# Patient Record
Sex: Male | Born: 1965 | Race: Black or African American | Hispanic: No | Smoking: Former smoker
Health system: Southern US, Community
[De-identification: ages and names within clinical notes are randomized; demographics above are authoritative.]

## PROBLEM LIST (undated history)

## (undated) ENCOUNTER — Emergency Department (HOSPITAL_COMMUNITY): Discharge status: Absent without leave | Payer: Self-pay

## (undated) DIAGNOSIS — K219 Gastro-esophageal reflux disease without esophagitis: Secondary | ICD-10-CM

## (undated) DIAGNOSIS — J40 Bronchitis, not specified as acute or chronic: Secondary | ICD-10-CM

## (undated) DIAGNOSIS — D869 Sarcoidosis, unspecified: Secondary | ICD-10-CM

## (undated) DIAGNOSIS — G4733 Obstructive sleep apnea (adult) (pediatric): Secondary | ICD-10-CM

## (undated) DIAGNOSIS — J189 Pneumonia, unspecified organism: Secondary | ICD-10-CM

## (undated) DIAGNOSIS — J9610 Chronic respiratory failure, unspecified whether with hypoxia or hypercapnia: Secondary | ICD-10-CM

## (undated) DIAGNOSIS — Z9989 Dependence on other enabling machines and devices: Secondary | ICD-10-CM

## (undated) DIAGNOSIS — J449 Chronic obstructive pulmonary disease, unspecified: Secondary | ICD-10-CM

## (undated) DIAGNOSIS — I10 Essential (primary) hypertension: Secondary | ICD-10-CM

## (undated) DIAGNOSIS — I509 Heart failure, unspecified: Secondary | ICD-10-CM

## (undated) HISTORY — DX: Chronic obstructive pulmonary disease, unspecified: J44.9

## (undated) HISTORY — PX: ROTATOR CUFF REPAIR: SHX139

## (undated) HISTORY — PX: OTHER SURGICAL HISTORY: SHX169

---

## 2000-02-22 ENCOUNTER — Ambulatory Visit: Admission: RE | Admit: 2000-02-22 | Discharge: 2000-02-22 | Payer: Self-pay | Admitting: Internal Medicine

## 2000-02-22 ENCOUNTER — Encounter (INDEPENDENT_AMBULATORY_CARE_PROVIDER_SITE_OTHER): Payer: Self-pay

## 2000-04-07 ENCOUNTER — Encounter: Admission: RE | Admit: 2000-04-07 | Discharge: 2000-04-07 | Payer: Self-pay | Admitting: Internal Medicine

## 2000-04-07 ENCOUNTER — Encounter: Payer: Self-pay | Admitting: Internal Medicine

## 2000-05-31 ENCOUNTER — Encounter: Payer: Self-pay | Admitting: Orthopedic Surgery

## 2000-05-31 ENCOUNTER — Ambulatory Visit (HOSPITAL_COMMUNITY): Admission: RE | Admit: 2000-05-31 | Discharge: 2000-05-31 | Payer: Self-pay | Admitting: Orthopedic Surgery

## 2000-11-16 ENCOUNTER — Ambulatory Visit (HOSPITAL_BASED_OUTPATIENT_CLINIC_OR_DEPARTMENT_OTHER): Admission: RE | Admit: 2000-11-16 | Discharge: 2000-11-16 | Payer: Self-pay | Admitting: Orthopedic Surgery

## 2004-06-25 ENCOUNTER — Ambulatory Visit: Payer: Self-pay | Admitting: Pulmonary Disease

## 2004-08-07 ENCOUNTER — Ambulatory Visit: Payer: Self-pay | Admitting: Pulmonary Disease

## 2004-08-10 ENCOUNTER — Ambulatory Visit: Payer: Self-pay | Admitting: Pulmonary Disease

## 2004-09-17 ENCOUNTER — Emergency Department (HOSPITAL_COMMUNITY): Admission: EM | Admit: 2004-09-17 | Discharge: 2004-09-17 | Payer: Self-pay | Admitting: Emergency Medicine

## 2004-11-03 ENCOUNTER — Ambulatory Visit: Payer: Self-pay | Admitting: Pulmonary Disease

## 2004-12-16 ENCOUNTER — Ambulatory Visit: Payer: Self-pay | Admitting: Pulmonary Disease

## 2005-01-08 ENCOUNTER — Ambulatory Visit: Payer: Self-pay | Admitting: Pulmonary Disease

## 2005-01-29 ENCOUNTER — Ambulatory Visit: Payer: Self-pay | Admitting: Pulmonary Disease

## 2005-03-02 ENCOUNTER — Ambulatory Visit: Payer: Self-pay | Admitting: Pulmonary Disease

## 2005-05-03 ENCOUNTER — Ambulatory Visit: Payer: Self-pay | Admitting: Pulmonary Disease

## 2006-01-22 ENCOUNTER — Emergency Department (HOSPITAL_COMMUNITY): Admission: EM | Admit: 2006-01-22 | Discharge: 2006-01-22 | Payer: Self-pay | Admitting: Emergency Medicine

## 2007-04-04 IMAGING — CR DG FEMUR 2+V*R*
5 series · 5 of 5 positions shown · non-contrast
Comparison: none

CLINICAL DATA: Right lower extremity trauma and pain.  
 RIGHT FEMUR ? 2 VIEW:

[t femur with hip  ap right]
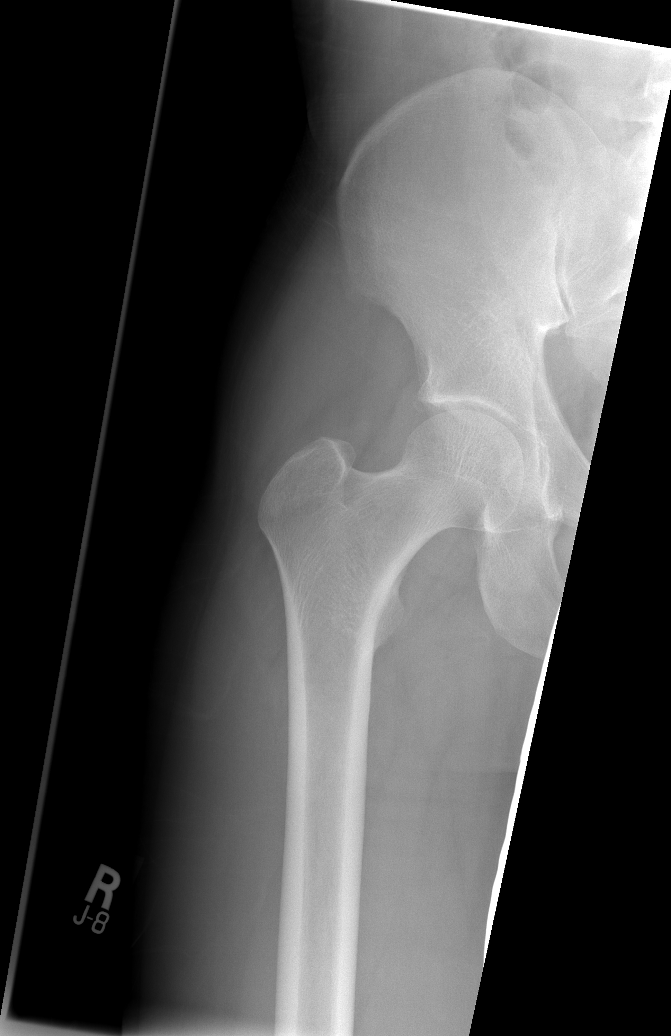

[t femur with knee ap right]
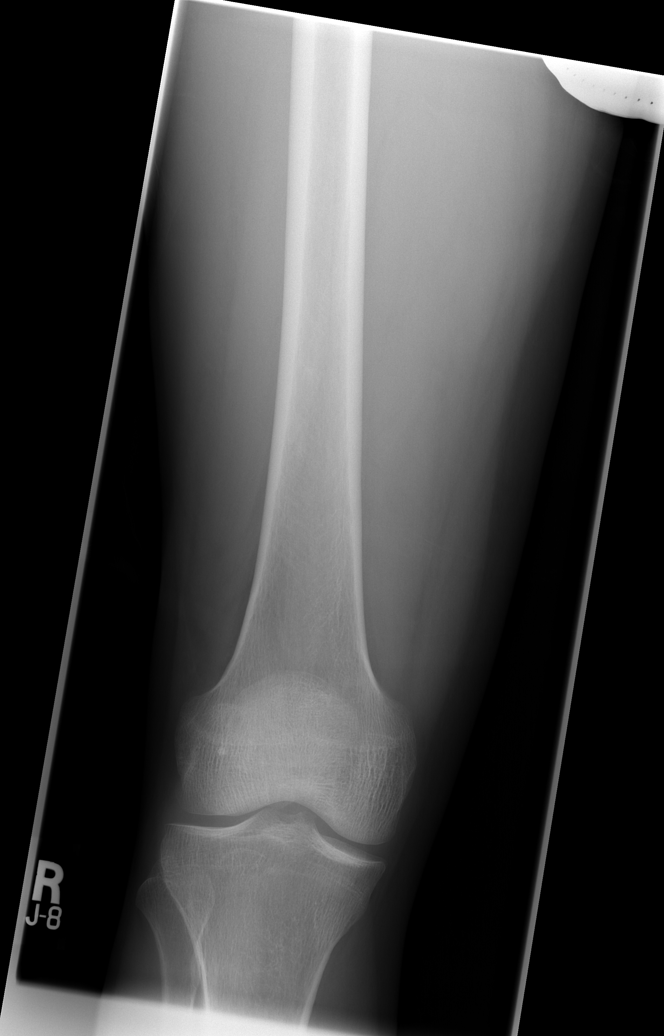

[t femur with hip lat right]
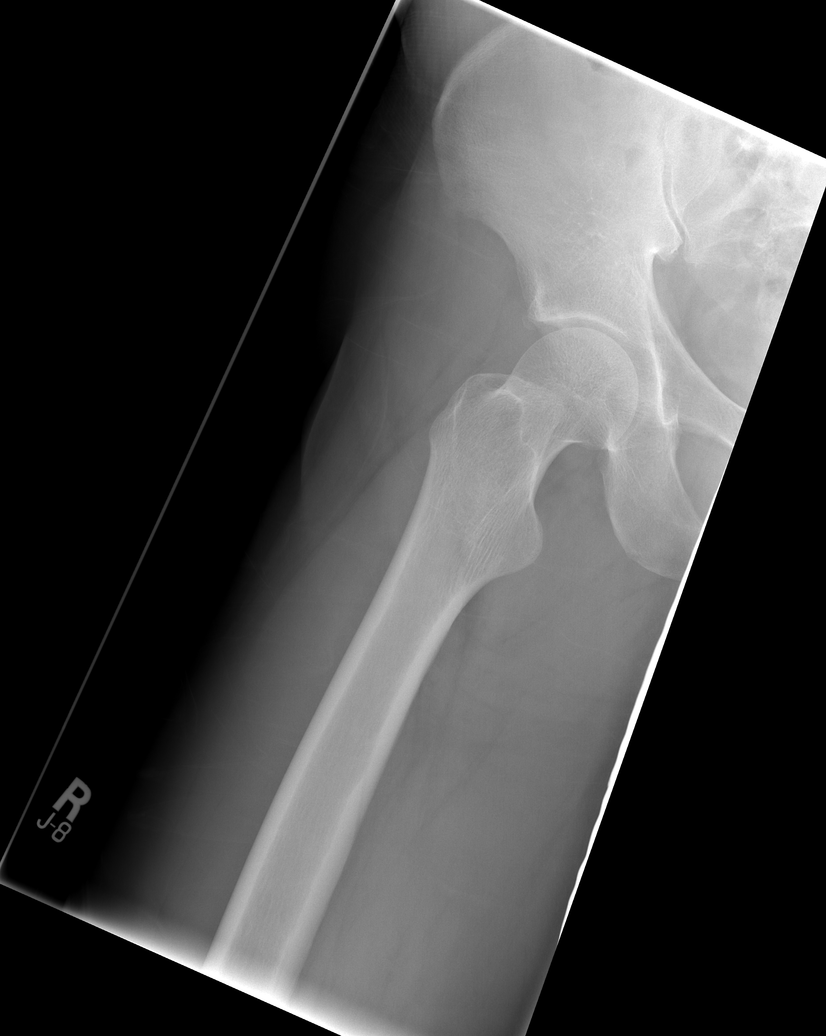

[t femur with knee lat right (1 of 2)]
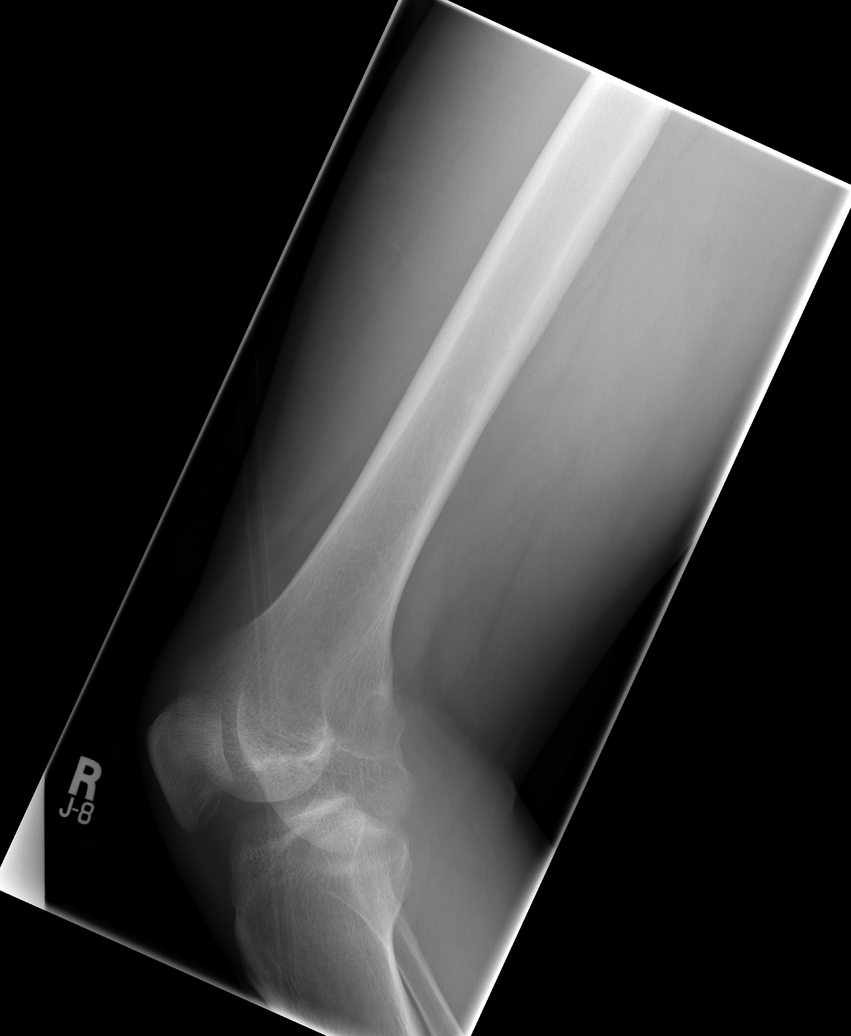

[t femur with knee lat right (2 of 2)]
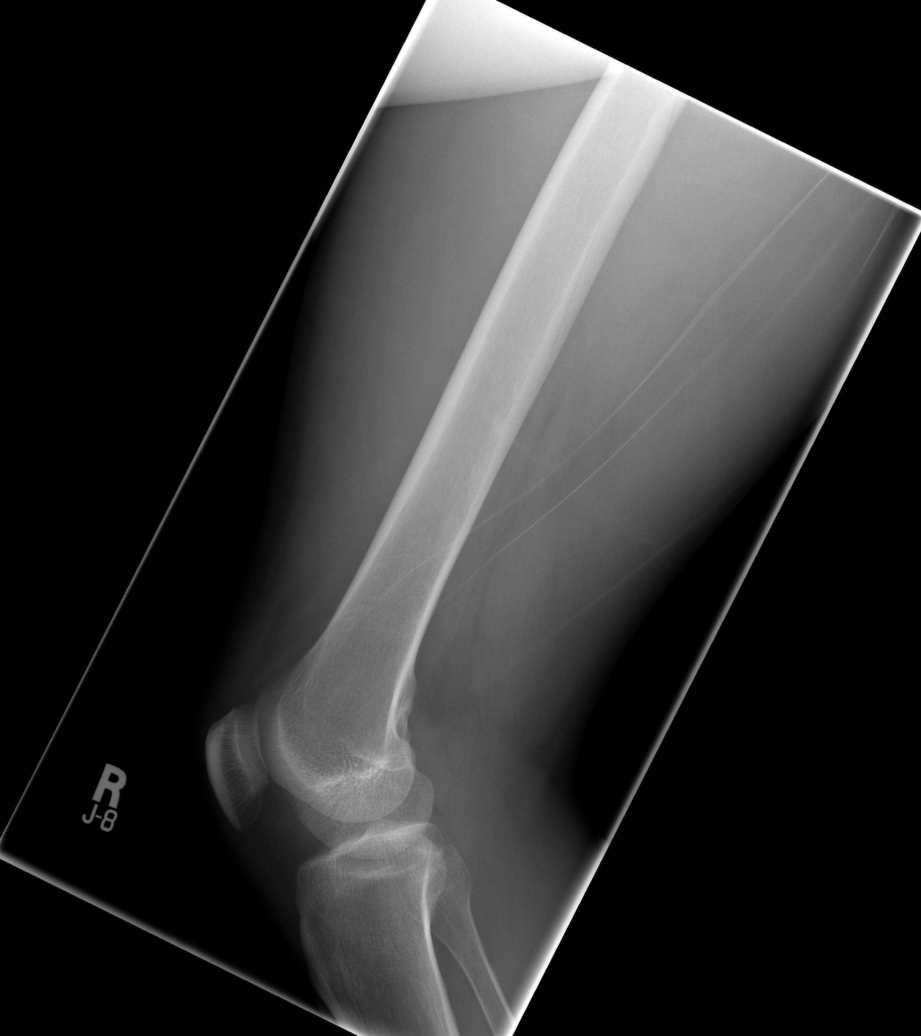

[5 of 5 positions shown; findings below may reference images not displayed]

FINDINGS: There is no evidence of femur fracture or other bone lesions.  There is a tiny ossific density along the margin of the right acetabular roof, which may represent a tiny avulsion fracture fragment or accessory ossicle.
IMPRESSION: 1.  No evidence of femur fracture.  
 2.  Question avulsion fracture or small accessory ossicle along the margin of the right acetabular roof.

## 2007-06-30 ENCOUNTER — Emergency Department (HOSPITAL_COMMUNITY): Admission: EM | Admit: 2007-06-30 | Discharge: 2007-06-30 | Payer: Self-pay | Admitting: Emergency Medicine

## 2008-12-16 ENCOUNTER — Emergency Department (HOSPITAL_COMMUNITY): Admission: EM | Admit: 2008-12-16 | Discharge: 2008-12-16 | Payer: Self-pay | Admitting: Emergency Medicine

## 2009-01-15 ENCOUNTER — Emergency Department (HOSPITAL_COMMUNITY): Admission: EM | Admit: 2009-01-15 | Discharge: 2009-01-15 | Payer: Self-pay | Admitting: Emergency Medicine

## 2010-03-28 LAB — DIFFERENTIAL
Basophils Absolute: 0 10*3/uL (ref 0.0–0.1)
Basophils Relative: 0 % (ref 0–1)
Lymphocytes Relative: 14 % (ref 12–46)
Monocytes Absolute: 0.6 10*3/uL (ref 0.1–1.0)
Neutro Abs: 3.5 10*3/uL (ref 1.7–7.7)
Neutrophils Relative %: 58 % (ref 43–77)

## 2010-03-28 LAB — BASIC METABOLIC PANEL
CO2: 24 mEq/L (ref 19–32)
Calcium: 8.6 mg/dL (ref 8.4–10.5)
Creatinine, Ser: 1.09 mg/dL (ref 0.4–1.5)
GFR calc Af Amer: >60 mL/min (ref >60)
GFR calc non Af Amer: >60 mL/min (ref >60)
Glucose, Bld: 116 mg/dL — ABNORMAL HIGH (ref 70–99)
Sodium: 137 mEq/L (ref 135–145)

## 2010-03-28 LAB — CBC
Hemoglobin: 13 g/dL (ref 13.0–17.0)
MCHC: 32.8 g/dL (ref 30.0–36.0)
Platelets: 361 10*3/uL (ref 150–400)
RDW: 15.2 % (ref 11.5–15.5)

## 2010-03-28 IMAGING — CT CT CHEST W/ CM
1 of 2 series · 14 of 32 positions shown, 18 images · IV contrast (agent unspecified)
Comparison: None

CLINICAL DATA: Cough and shortness of breath.  Abnormal chest x-
ray.

CT CHEST WITH CONTRAST
TECHNIQUE: Multidetector CT imaging of the chest was performed
following the standard protocol during bolus administration of
intravenous contrast.
Contrast: 60 ml 7mnipaque-4CC

[Series 2: rtn chest with st · axial · 0.68mm/px · z∈[-287,-12]mm · 14 of 67 slices shown, 18 images]
[im 6/67  mediastinal]
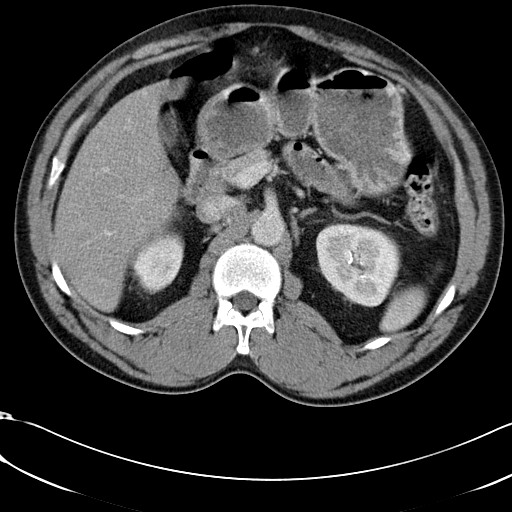
[im 6/67  lung]
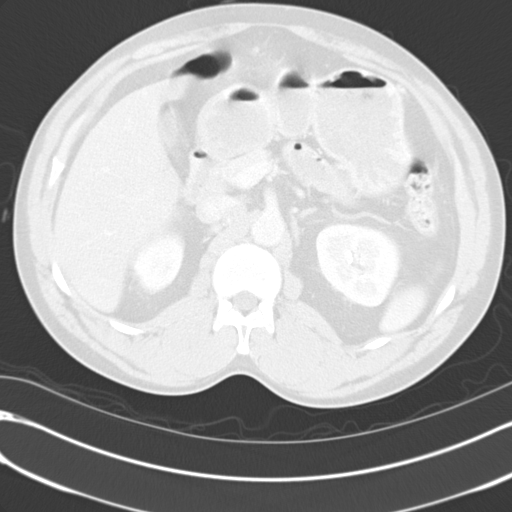
[im 11/67  lung]
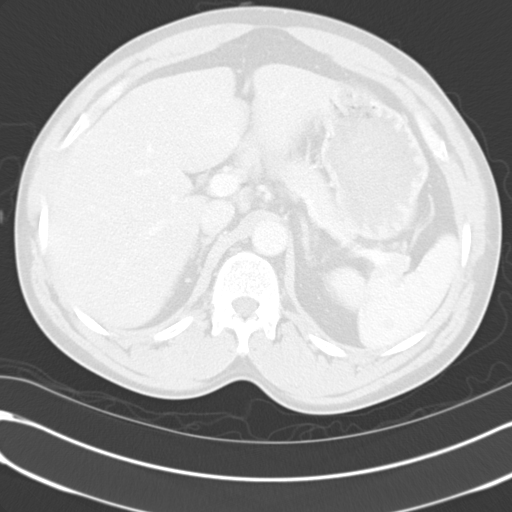
[im 16/67  lung]
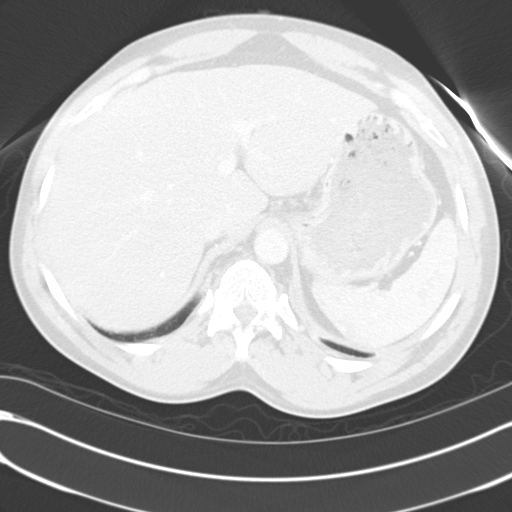
[im 21/67  lung]
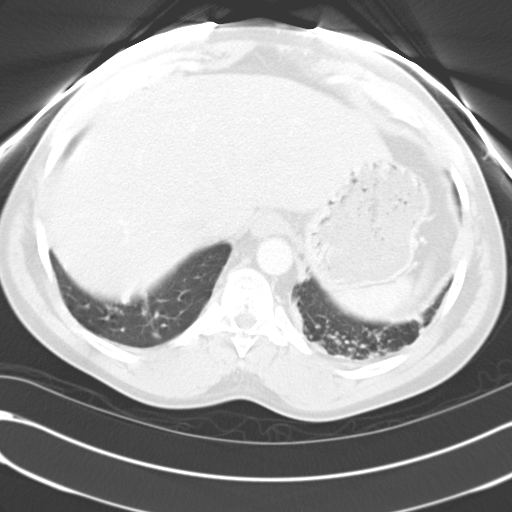
[im 26/67  mediastinal]
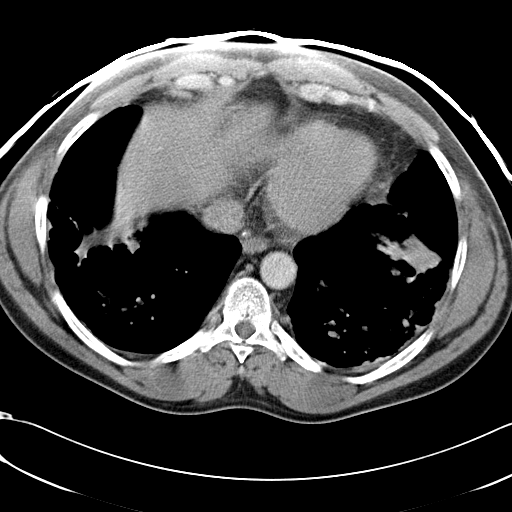
[im 26/67  lung]
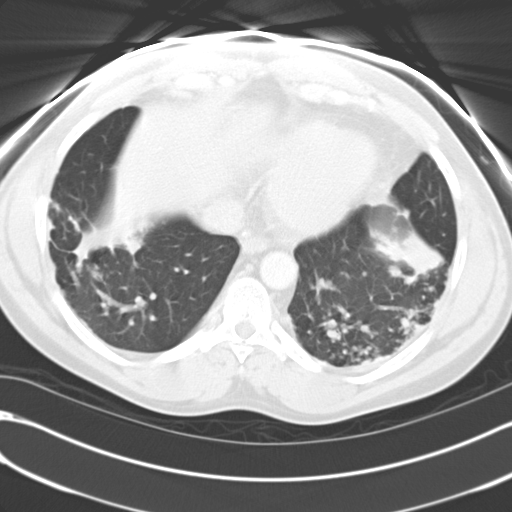
[im 31/67  lung]
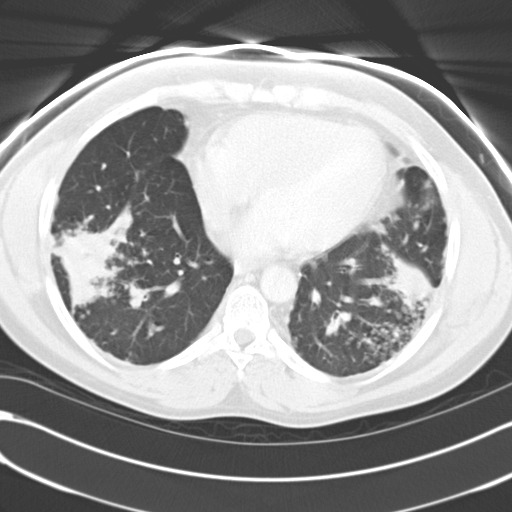
[im 32/67  lung]
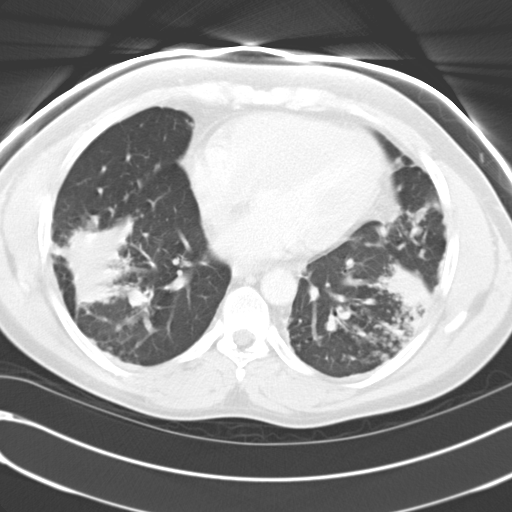
[im 34/67  lung]
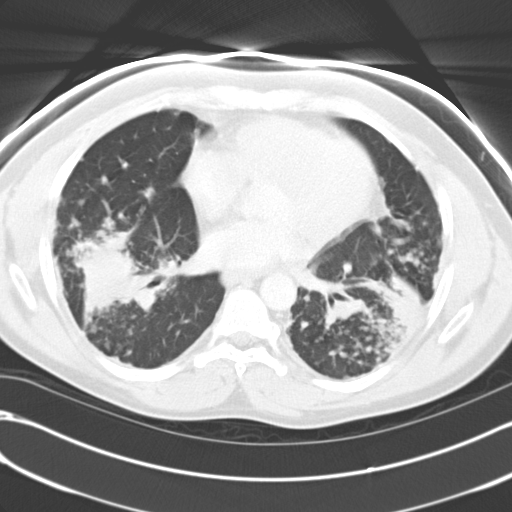
[im 36/67  mediastinal]
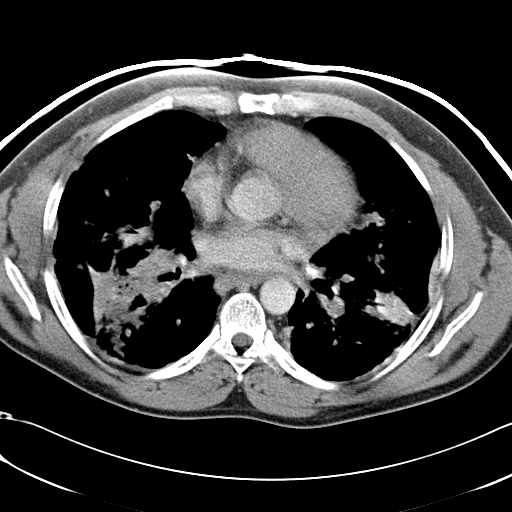
[im 36/67  lung]
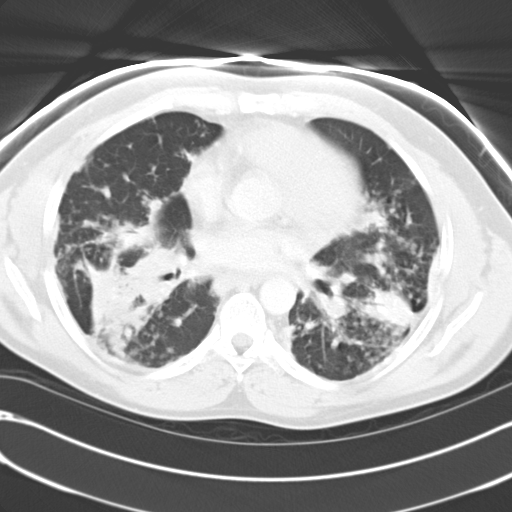
[im 41/67  lung]
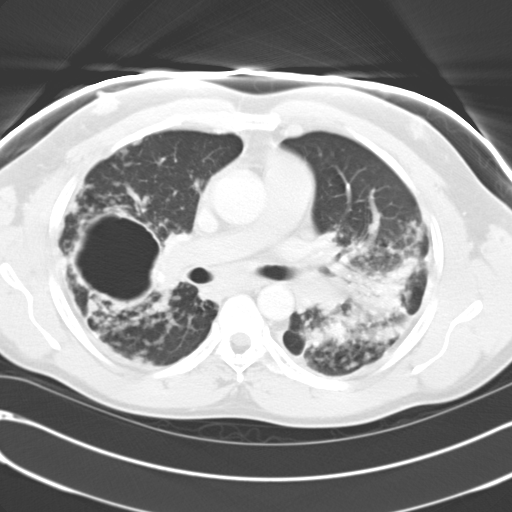
[im 46/67  lung]
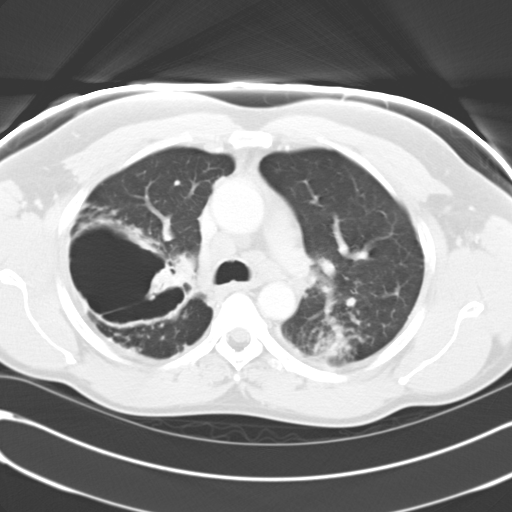
[im 51/67  lung]
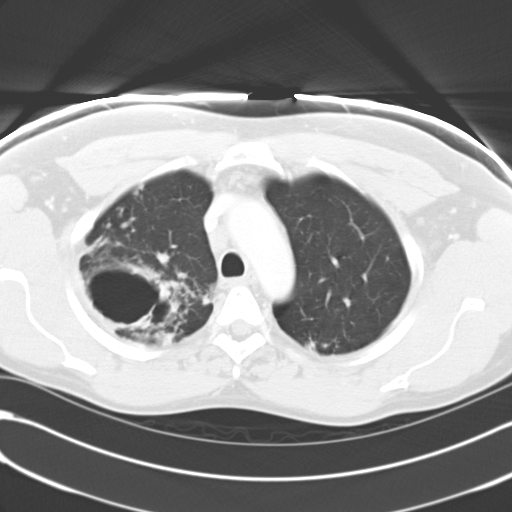
[im 56/67  mediastinal]
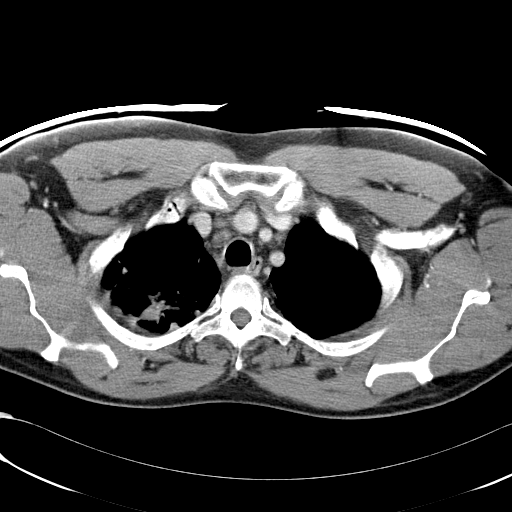
[im 56/67  lung]
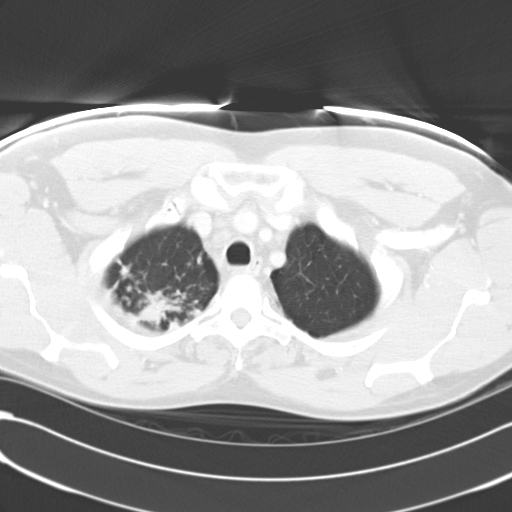
[im 61/67  lung]
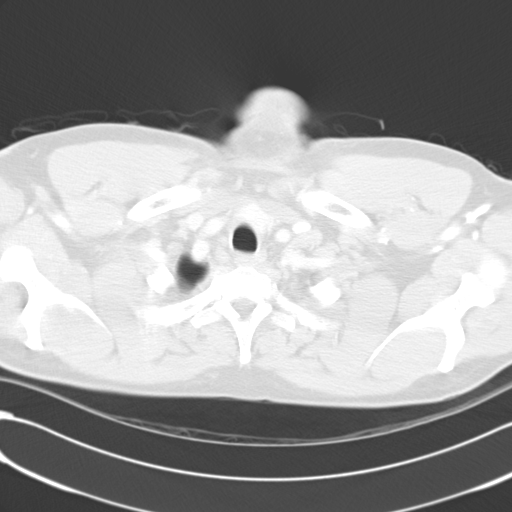

[14 of 32 positions shown; findings below may reference images not displayed]

FINDINGS: The patient has extensive hilar and mediastinal
adenopathy with conglomerate perihilar masses extending into the
upper and lower lobes.  There are multiple small nodular densities
in both lungs as well.

There is a large cavitary lesion in the right upper lobe measuring
10 cm maximal diameter.

There are punctate calcifications throughout the extensive areas of
consolidation in the lungs.

There are no significant effusions.

Note is made of multiple lesions in the spleen.

Fall the findings are consistent with sarcoidosis.

There are no prior chest CTs or chest x-rays available for
comparison at this time.

Overall heart size is normal.  No significant bony abnormality.
IMPRESSION: Extensive bilateral pulmonary disease consistent with sarcoidosis.
Extensive adenopathy in the mediastinum and hilar regions.

Multiple lesions in the spleen.

Large cavitary lesion in the right upper lobe.

## 2010-03-28 IMAGING — CT CT NECK W/ CM
1 series · 12 of 14 positions shown, 15 images · IV contrast (agent unspecified)
Comparison: None

CLINICAL DATA: Sore throat, right facial pain, dysphagia and
hoarseness for 3 weeks.

CT NECK WITH CONTRAST
TECHNIQUE: Multidetector CT imaging of the neck was performed with
intravenous contrast.
Contrast: 100 ml Wmnipaque-B55 intravenously

[Series 2: neck rtn st · axial · 0.39mm/px · z∈[-862,-664]mm · 12 of 80 slices shown, 15 images]
[im 7/80  soft-tissue]
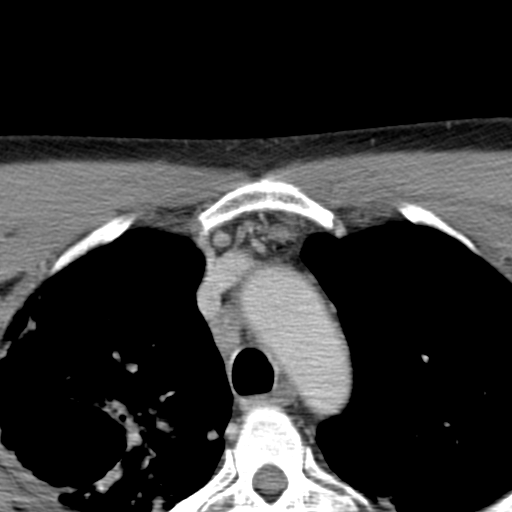
[im 7/80  bone]
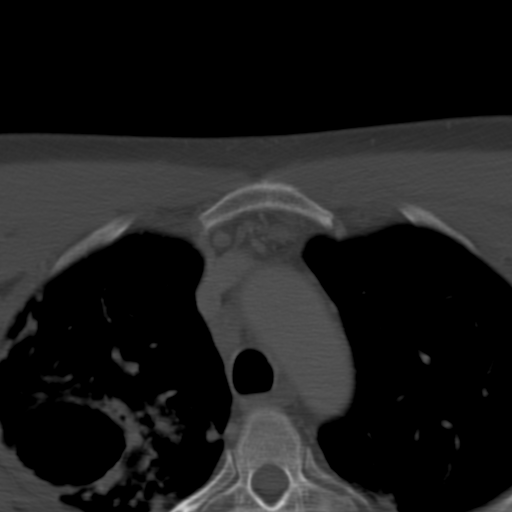
[im 13/80  bone]
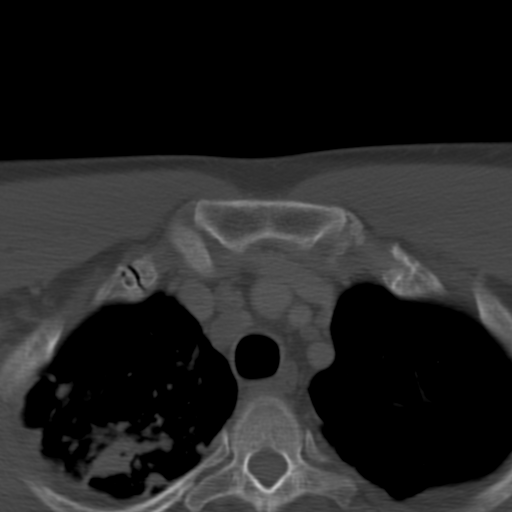
[im 19/80  bone]
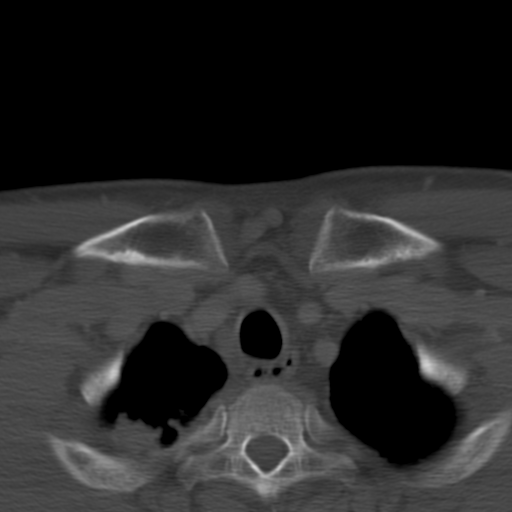
[im 25/80  bone]
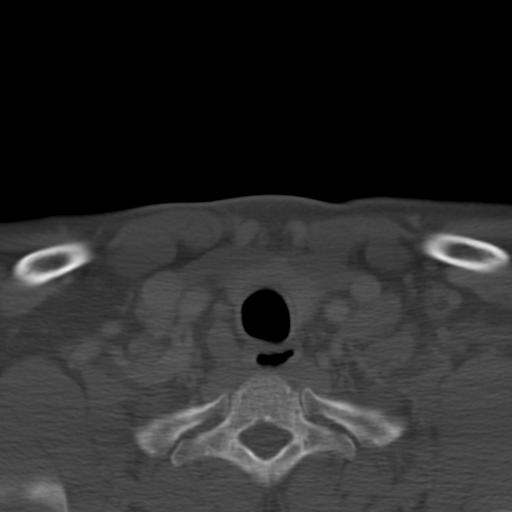
[im 31/80  soft-tissue]
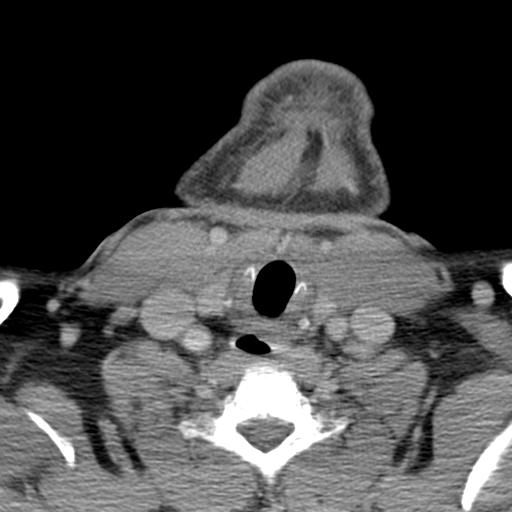
[im 31/80  bone]
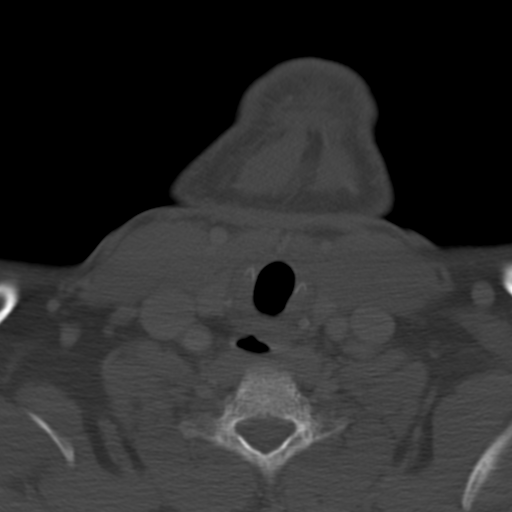
[im 37/80  bone]
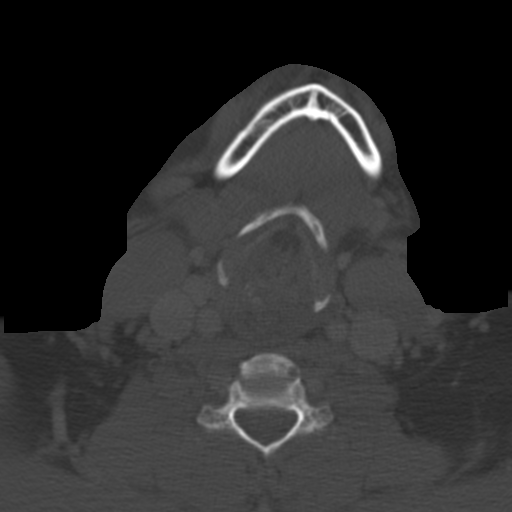
[im 43/80  bone]
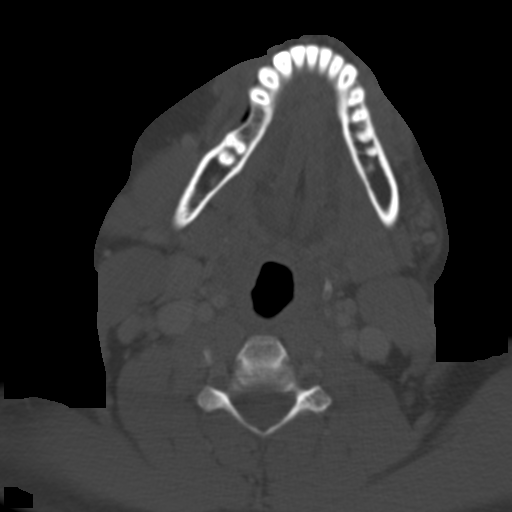
[im 49/80  bone]
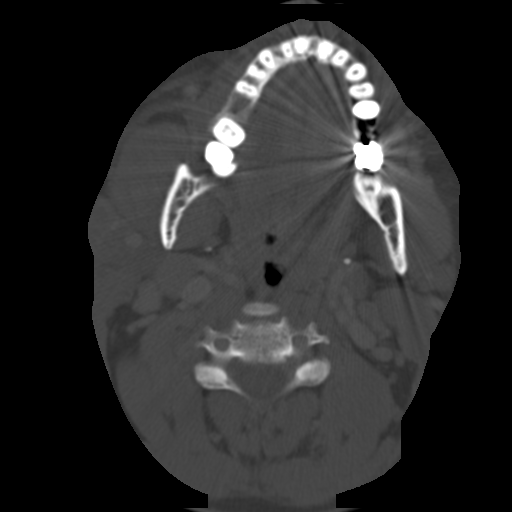
[im 55/80  soft-tissue]
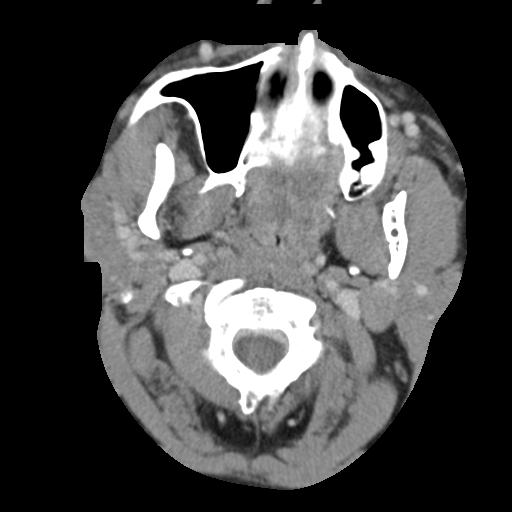
[im 55/80  bone]
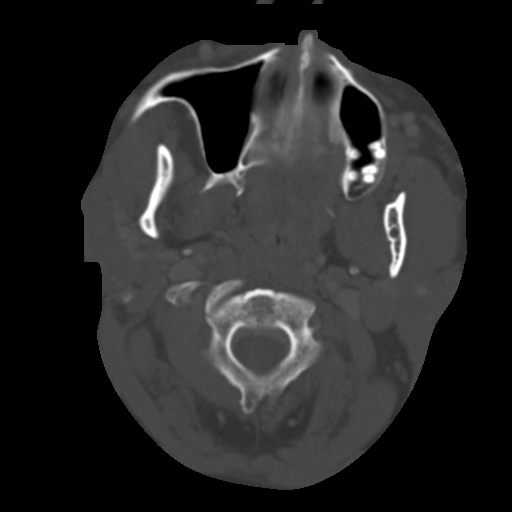
[im 61/80  bone]
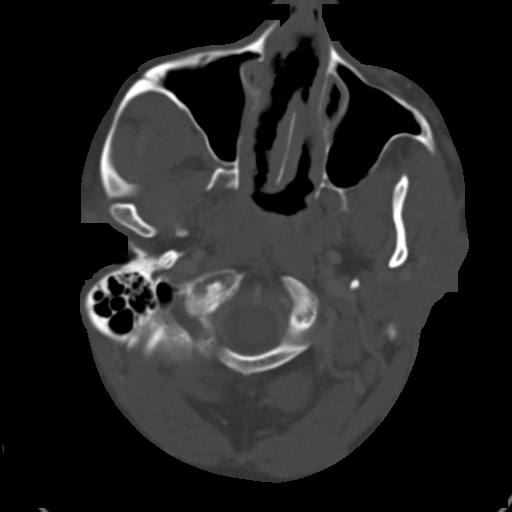
[im 67/80  bone]
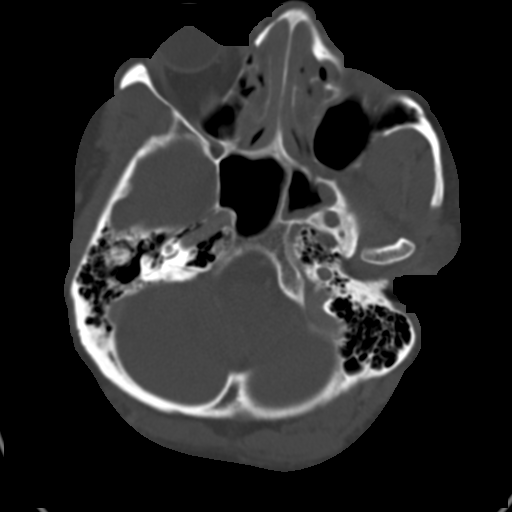
[im 73/80  bone]
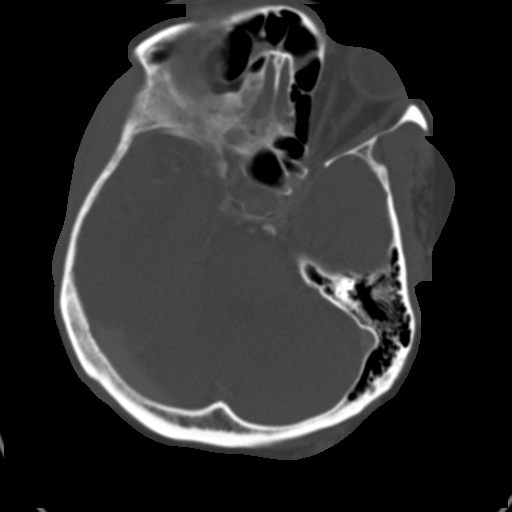

[12 of 14 positions shown; findings below may reference images not displayed]

FINDINGS: No lesions of the pharyngeal mucosal space are
identified.  Normal-sized lymph nodes are present in the upper neck
bilaterally.  There is a 1.8 cm level II node on the right on image
36.  No other pathologically enlarged lymph nodes are identified.
The thyroid and salivary glands appear unremarkable.

There is marked mucosal thickening throughout the ethmoid sinuses
bilaterally.  The additional visualized paranasal sinuses are
clear.  The orbital and intracranial visualized contents are
unremarkable.

A large cavitary lesion is partially imaged in the right lung apex,
measuring up to 7.4 cm on image 79.  Multiple adjacent nodular
opacities are present in the right upper lobe.  The left lung apex
appears clear.  There are several mildly enlarged superior
mediastinal lymph nodes, including a 1 cm high right paratracheal
node on image 58, a pretracheal 1.3 cm node on image 69 and a
cm pretracheal node on image 80.  There is a 1.2 cm left
supraclavicular node on image 57.
IMPRESSION: 1.  Partially imaged cavitary right upper lobe mass and associated
mediastinal lymphadenopathy.  Findings are concerning for possible
lung cancer with mass effect on the recurrent laryngeal nerve.  In
discussion with Dr. Deshawn, the patient has a history of
sarcoidosis, and these findings are potentially inflammatory (from
sarcoidosis or possible superimposed tuberculosis).
2.  Single mildly enlarged level II node on the right, nonspecific.
3.  Diffuse ethmoid sinus mucosal thickening.

CT the chest is recommended for further evaluation.  Findings were
discussed with Dr. Deshawn.

## 2010-04-14 LAB — POCT I-STAT, CHEM 8
Calcium, Ion: 1.11 mmol/L — ABNORMAL LOW (ref 1.12–1.32)
Chloride: 108 mEq/L (ref 96–112)
Glucose, Bld: 117 mg/dL — ABNORMAL HIGH (ref 70–99)
HCT: 43 % (ref 39.0–52.0)
TCO2: 22 mmol/L (ref 0–100)

## 2010-04-14 LAB — URINE MICROSCOPIC-ADD ON

## 2010-04-14 LAB — URINALYSIS, ROUTINE W REFLEX MICROSCOPIC
Bilirubin Urine: NEGATIVE
Leukocytes, UA: NEGATIVE
Nitrite: NEGATIVE
Specific Gravity, Urine: 1.009 (ref 1.005–1.030)
Urobilinogen, UA: 0.2 mg/dL (ref 0.0–1.0)
pH: 6 (ref 5.0–8.0)

## 2010-05-29 NOTE — Procedures (Signed)
[facility]  Patient:    [name], [name]                         MRN: [id number] Proc. Date: 02/22/00 Adm. Date:  [id number] Attending:  Avie Echevaria CC:         Earl Lites, M.D., [facility], [place]   Procedure Report  REQUESTING PHYSICIAN:  Dr. Earl Lites  PROCEDURE:  Fiberoptic bronchoscopy with transbronchial biopsy of the left lower lobe.  HISTORY AND INDICATIONS:  [name] is a very nice 45 year old black male with asymptomatic hilar adenopathy.  He was seen in the office last week.  For a detailed comprehensive history and physical examination, please see the pulmonary consultation note dictated in the office last week.  He agreed to the procedure after a full discussion of the risks, benefits, and alternatives with the purpose of the procedure being to obtain transbronchial biopsies for histologic confirmation of suspected sarcoid.  The procedure was performed in the bronchoscopy suite with continuous monitoring by surface ECG and oximetry.  The patient maintained greater than 90% saturations throughout the procedure in sinus rhythm.  He received a total of 50 mg IV Demerol and 5 mg of IV Versed for adequate sedation and cough suppression.  DESCRIPTION OF PROCEDURE:  Using a standard flexible fiberoptic bronchoscope, the right naris was easily cannulated with good visualization of the oropharynx and larynx.  The cords moved normally, and there were no apparent upper airway lesions.  Using additional 1% lidocaine as needed, the entire tracheobronchial tree was explored bilaterally with the following findings.  Trachea, carina, and all the major airways were opened widely on inspiration. There was minimal concentric narrowing diffusely without any typical cobblestoning noted.  However, the carina and all of the major airways had sharp air dividers with no evidence of definite subcarinal adenopathy.  Therefore, a transbronchial  approach was felt to be best for the histologic diagnosis of sarcoid.  Using the fluoroscope in a wedge position, the left lower lobe basal segments were biopsied x 6 with adequate tissues obtained. Additionally, the lingula was lavaged.  The patient tolerated the procedure well, and a follow-up chest x-ray is pending.  We will observe him until he is alert and well saturated on room air before discharge and follow him up as an outpatient.  DIAGNOSIS:  Asymptomatic hilar adenopathy associated with minimal symptoms. This strongly favors sarcoid.  Histologic diagnosis pending.  If his transbronchial biopsies show no evidence of noncaseating granulomatous inflammation, we will need to make a decision whether to follow him conservatively or proceed with mediastinoscopy.  I will therefore see him back in the office to review the next step in the work-up. DD:  02/22/00 TD:  02/23/00 Job: [id number]

## 2010-05-29 NOTE — Op Note (Signed)
[place]  Patient:    [name], [name] Visit Number: [id number] MRN: [id number]          Service Type: Attending:  Artist Pais. Mina Marble, M.D. Dictated by:   Artist Pais Mina Marble, M.D. Proc. Date: 11/16/00                             Operative Report  PREOPERATIVE DIAGNOSES: 1. Right cubital tunnel syndrome. 2. Right Guyons canal compression.  POSTOPERATIVE DIAGNOSES: 1. Right cubital tunnel syndrome. 2. Right Guyons canal compression.  PROCEDURES: 1. Release, ulnar nerve, cubital tunnel on the right. 2. Release ulnar nerve Guyons canal, on the right, through a separate    incision.  SURGEON:  Artist Pais. Mina Marble, M.D.  ASSISTANT:  Junius Roads. Ireton, P.A.C.  ANESTHESIA:  General.  TOURNIQUET TIME:  40 minutes.  COMPLICATIONS:  None.  DRAINS:  None.  DESCRIPTION OF PROCEDURE:  The patient was taken to the operating room and after the induction of adequate general anesthesia, the right upper extremity was prepped and draped in the usual sterile fashion.  An Esmarch was used to exsanguinate the limb.  The tourniquet was inflated to 250 mmHg.  At this point in time the patients hand was placed on the arm table, well-padded, and an incision was made on the ulnar side of the wrist in the area of Guyons canal, longitudinal in nature, until it hit the distal wrist flexion crease, where it was brunnered.  Flaps were raised to close, and the ulnar artery and nerve were identified proximal to the Guyons canal area and the ulnar nerve was decompressed and followed into Guyons canal until the superficial and deep branches were identified.  This was completely decompressed.  After this was done, attention was paid to the elbow, where an incision was made 6 cm in length centered over the space between the medial epicondyle and the olecranon process.  It was extended proximally and distally 2 cm in either direction. The incision was taken down through  the skin and subcutaneous tissues, taking care to carefully identify and retract the medial antebrachial cutaneous nerve.  Once this was done, the ulnar nerve was identified proximal to the cubital tunnel and after this was done, the cubital tunnel was released.  It was then traced distally into the flexor carpi ulnaris muscle bellies.  The fascia overlying the FCU muscles were split, and the nerve was traced down for approximately 3 cm.  Proximal to the cubital tunnel, the fascia overlying the nerve was released as well as a large portion of the medial intermuscular septum.  After a complete decompression, the nerve was stable in the ulnar groove, good flexion and extension.  The wound was then thoroughly irrigated. The elbow wound was closed with running 4-0 Monocryl with subcuticular stitch, and the wrist wound was closed with 5-0 nylon in a combination of simple and horizontal mattress sutures.  A sterile dressing of Xeroform and 4 x 4 fluffs and a compressive dressing with a volar splint was applied as well as a posterior splint for the elbow.  The patient also had Marcaine 0.25% plain injected for postoperative pain control. Dictated by:   Artist Pais Mina Marble, M.D. Attending:  Artist Pais Mina Marble, M.D. DD:  11/16/00 TD:  11/17/00 Job: [id number]

## 2010-06-06 ENCOUNTER — Emergency Department (HOSPITAL_COMMUNITY): Payer: 59

## 2010-06-06 ENCOUNTER — Emergency Department (HOSPITAL_COMMUNITY)
Admission: EM | Admit: 2010-06-06 | Discharge: 2010-06-06 | Disposition: A | Discharge status: Home / Self Care | Payer: 59 | Attending: Emergency Medicine | Admitting: Emergency Medicine

## 2010-06-06 DIAGNOSIS — D869 Sarcoidosis, unspecified: Secondary | ICD-10-CM | POA: Insufficient documentation

## 2010-06-06 DIAGNOSIS — M7989 Other specified soft tissue disorders: Secondary | ICD-10-CM | POA: Insufficient documentation

## 2010-06-06 DIAGNOSIS — M79609 Pain in unspecified limb: Secondary | ICD-10-CM | POA: Insufficient documentation

## 2010-06-06 DIAGNOSIS — G4730 Sleep apnea, unspecified: Secondary | ICD-10-CM | POA: Insufficient documentation

## 2010-06-06 DIAGNOSIS — M109 Gout, unspecified: Secondary | ICD-10-CM | POA: Insufficient documentation

## 2010-10-21 ENCOUNTER — Emergency Department (HOSPITAL_COMMUNITY)
Admission: EM | Admit: 2010-10-21 | Discharge: 2010-10-21 | Disposition: A | Discharge status: Home / Self Care | Payer: 59 | Attending: Emergency Medicine | Admitting: Emergency Medicine

## 2010-10-21 ENCOUNTER — Emergency Department (HOSPITAL_COMMUNITY): Payer: 59

## 2010-10-21 DIAGNOSIS — J40 Bronchitis, not specified as acute or chronic: Secondary | ICD-10-CM | POA: Insufficient documentation

## 2010-10-21 DIAGNOSIS — R0602 Shortness of breath: Secondary | ICD-10-CM | POA: Insufficient documentation

## 2010-10-21 DIAGNOSIS — D869 Sarcoidosis, unspecified: Secondary | ICD-10-CM | POA: Insufficient documentation

## 2010-10-21 DIAGNOSIS — R079 Chest pain, unspecified: Secondary | ICD-10-CM | POA: Insufficient documentation

## 2011-01-15 ENCOUNTER — Emergency Department (HOSPITAL_COMMUNITY): Payer: 59

## 2011-01-15 ENCOUNTER — Encounter: Payer: Self-pay | Admitting: *Deleted

## 2011-01-15 ENCOUNTER — Emergency Department (HOSPITAL_COMMUNITY)
Admission: EM | Admit: 2011-01-15 | Discharge: 2011-01-15 | Disposition: A | Discharge status: Home / Self Care | Payer: 59 | Attending: Internal Medicine | Admitting: Internal Medicine

## 2011-01-15 DIAGNOSIS — G4733 Obstructive sleep apnea (adult) (pediatric): Secondary | ICD-10-CM | POA: Diagnosis present

## 2011-01-15 DIAGNOSIS — D869 Sarcoidosis, unspecified: Secondary | ICD-10-CM | POA: Diagnosis present

## 2011-01-15 DIAGNOSIS — J99 Respiratory disorders in diseases classified elsewhere: Secondary | ICD-10-CM | POA: Insufficient documentation

## 2011-01-15 DIAGNOSIS — Z9989 Dependence on other enabling machines and devices: Secondary | ICD-10-CM | POA: Diagnosis present

## 2011-01-15 DIAGNOSIS — I10 Essential (primary) hypertension: Secondary | ICD-10-CM | POA: Insufficient documentation

## 2011-01-15 DIAGNOSIS — Z79899 Other long term (current) drug therapy: Secondary | ICD-10-CM | POA: Insufficient documentation

## 2011-01-15 DIAGNOSIS — D860 Sarcoidosis of lung: Secondary | ICD-10-CM

## 2011-01-15 DIAGNOSIS — R0902 Hypoxemia: Secondary | ICD-10-CM | POA: Insufficient documentation

## 2011-01-15 DIAGNOSIS — J209 Acute bronchitis, unspecified: Secondary | ICD-10-CM | POA: Diagnosis present

## 2011-01-15 HISTORY — DX: Sarcoidosis, unspecified: D86.9

## 2011-01-15 HISTORY — DX: Essential (primary) hypertension: I10

## 2011-01-15 LAB — CBC
HCT: 37.6 % — ABNORMAL LOW (ref 39.0–52.0)
MCHC: 32.2 g/dL (ref 30.0–36.0)
Platelets: 252 10*3/uL (ref 150–400)
RDW: 13.9 % (ref 11.5–15.5)
WBC: 6.2 10*3/uL (ref 4.0–10.5)

## 2011-01-15 LAB — DIFFERENTIAL
Basophils Absolute: 0 10*3/uL (ref 0.0–0.1)
Basophils Relative: 0 % (ref 0–1)
Eosinophils Relative: 5 % (ref 0–5)
Monocytes Absolute: 0.7 10*3/uL (ref 0.1–1.0)
Neutro Abs: 3.8 10*3/uL (ref 1.7–7.7)

## 2011-01-15 LAB — BASIC METABOLIC PANEL
Calcium: 9.4 mg/dL (ref 8.4–10.5)
Chloride: 101 mEq/L (ref 96–112)
Creatinine, Ser: 0.89 mg/dL (ref 0.50–1.35)
GFR calc Af Amer: >90 mL/min (ref >90)
Sodium: 135 mEq/L (ref 135–145)

## 2011-01-15 MED ORDER — METHYLPREDNISOLONE SODIUM SUCC 125 MG IJ SOLR
INTRAMUSCULAR | Status: AC
  Filled 2011-01-15: qty 2

## 2011-01-15 MED ORDER — PREDNISONE 5 MG PO TABS: ORAL_TABLET | ORAL | Status: DC

## 2011-01-15 MED ORDER — GUAIFENESIN ER 600 MG PO TB12: 600.0000 mg | ORAL_TABLET | Freq: Two times a day (BID) | ORAL | Status: DC

## 2011-01-15 MED ORDER — SODIUM CHLORIDE 0.9 % IV SOLN
Freq: Once | INTRAVENOUS | Status: AC
  Administered 2011-01-15: 05:00:00 via INTRAVENOUS

## 2011-01-15 MED ORDER — IPRATROPIUM BROMIDE 0.02 % IN SOLN
RESPIRATORY_TRACT | Status: AC
  Filled 2011-01-15: qty 2.5

## 2011-01-15 MED ORDER — MOXIFLOXACIN HCL 400 MG PO TABS: 400.0000 mg | ORAL_TABLET | Freq: Every day | ORAL | Status: AC

## 2011-01-15 MED ORDER — ALBUTEROL SULFATE (5 MG/ML) 0.5% IN NEBU
5.0000 mg | INHALATION_SOLUTION | Freq: Once | RESPIRATORY_TRACT | Status: AC
  Administered 2011-01-15: 5 mg via RESPIRATORY_TRACT
  Filled 2011-01-15 (×2): qty 0.5

## 2011-01-15 NOTE — ED Notes (Signed)
Per EMS:  Pt woke up wheezing and SOB, pt also had a productive cough.  Pt took several puffs of his own inhaler prior to EMS' arrival.  In route pt had 15mg  albuterol, of atrovent, of solu medrol.  Pt still has bilateral inspiratory and expiratory wheezing.  EMS' st's pt's condition has greatly improved from on scene.

## 2011-01-15 NOTE — ED Notes (Signed)
Pt. Refuses admission--States he is aware of the risks of leaving to include death but, he will return if he gets worse.

## 2011-01-15 NOTE — ED Notes (Signed)
[id number]<JY> Expected date:<BR> Expected time:<BR> Means of arrival:<BR> Comments:<BR> EMS/SOB/wheezing-neb in progress-male

## 2011-01-15 NOTE — Discharge Summary (Signed)
Physician Discharge Summary  Patient ID: [name] MRN: [id number] DOB/AGE: 47-Jul-1967 47 y.o.  Admit date: 01/15/2011 Discharge date: 01/15/2011  Primary Care Physician:   Dr Stephani Police. Pulmonologist: Dr Mordecai Maes at [facility].  Discharge Diagnoses:    Patient Active Problem List  Diagnoses  . Acute bronchitis  . Sarcoid  . OSA on CPAP    Current Discharge Medication List    START taking these medications   Details  guaiFENesin (MUCINEX) 600 MG 12 hr tablet Take 1 tablet (600 mg total) by mouth 2 (two) times daily. Qty: 14 tablet, Refills: 0    moxifloxacin (AVELOX) 400 MG tablet Take 1 tablet (400 mg total) by mouth daily. Qty: 7 tablet, Refills: 0    predniSONE (DELTASONE) 5 MG tablet Take 40 mg daily for 3 days, then 30 mg daily for 3 days, then 20 mg daily for 3 days, then 10 mg daily for 3 days, then stop. Qty: 60 tablet, Refills: 0      CONTINUE these medications which have NOT CHANGED   Details  albuterol (PROVENTIL) (2.5 MG/3ML) 0.083% nebulizer solution Take 2.5 mg by nebulization every 6 (six) hours as needed.      cyclobenzaprine (FLEXERIL) 10 MG tablet Take by mouth 3 (three) times daily as needed.           Disposition and Follow-up: Follow up with primary MD and pulmonologist.  Consults:  none  None.  Significant Diagnostic Studies:  No results found.  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 Patient had developed upper respiratory symptoms of nasal congestion, productive cough and post-nasal drip on 01/14/11, went to  bed at 11:30 PM after taking TerraFlu, and awoke suddenly at 2:30 AM on 01/15/11, with mild SOB and productive cough. Felt "cold and Sweaty". He called 911, and was brought to the ED.  He denies sick contacts, and has just completed a 3 week steroid taper on 01/11/11. Patient is a non-smoker, drinks alcohol occasionally, and has no history of drug abuse. He is divorced, with 2 offspring, disabled/retired form the city of [place].   Physical Exam: General:   Alert, communicative, fully oriented, talking in complete sentences, not short of breath at rest.  HEENT:  No clinical pallor, no jaundice, no conjunctival injection or discharge. Hydration status is fair. NECK:  Supple, JVP not seen, no carotid bruits, no palpable lymphadenopathy, no palpable goiter. CHEST:  Clinically clear to auscultation, no wheezes, no crackles. HEART:  Sounds 1 and 2 heard, normal, regular, no murmurs. ABDOMEN:  Full, soft, non-tender, no palpable organomegaly, no palpable masses, normal bowel sounds. GENITALIA:  Not examined.. LOWER EXTREMITIES:  No pitting edema, palpable peripheral pulses. MUSCULOSKELETAL SYSTEM:  Unremarkable. CENTRAL NERVOUS SYSTEM:  No focal neurologic deficit on gross examination.    Hospital Course:  Principal Problem:  *Acute bronchitis: Patient presented as described above. CXR showed no evidence of pneumonic consolidation. He was afebrile, and not short of breath. He will require antibiotic therapy with Avelox, bronchodilator treatment ,, Mucinex, and a tapering course of Prednisone. Active Problems:  1. Sarcoid: Stable. Patient will continue to follow up routinely with his  primary pulmonologist.  2. OSA on CPAP: Stable.   Comment: Patient at the time of this evaluation, felt comfortable, and was insistent that he did not want to be hospitalized. He appears stable for discharge on above outpatient treatment, so we have deferred to his wishes, and discharged him accordingly, with  recommendations to return to ED, should his symptoms worsen.  Time spent on Discharge: 45 mins.  Signed: Rhiannon Sassaman,CHRISTOPHER 01/15/2011, 9:06 AM

## 2011-01-15 NOTE — ED Notes (Signed)
Report received from p.m. Shift---Pt. Lying on carrier watching TV and appears to be in no distress.  B/P 176/99  HR 106  R 26 P.O. 100% on 2 liters of 02 via nasal cannula.  Cup of ice requested and given.

## 2011-01-15 NOTE — ED Notes (Addendum)
Just spoke with lab, pt has critical K+ value: 7.1, however they said it was hemolyzed

## 2011-01-15 NOTE — ED Provider Notes (Signed)
History     CSN: [id number]  Arrival date & time 01/15/11  0430   First MD Initiated Contact with Patient 01/15/11 [phone]      Chief Complaint  Patient presents with  . Shortness of Breath    (Consider location/radiation/quality/duration/timing/severity/associated sxs/prior treatment) Patient is a 46 y.o. male presenting with shortness of breath. The history is provided by the patient.  Shortness of Breath  Associated symptoms include shortness of breath.   Patient here with acute onset of shortness of breath upon awakening from sleep. Patient has a history of sarcoidosis. EMS was called and patient was given albuterol, Atrovent, Solu-Medrol. He denies any chest pain does note some chest tightness. No orthopnea or peripheral edema. Patient is improved since being on the medications including oxygen. No recent fever or cough prior to arrival Past Medical History  Diagnosis Date  . Hypertension   . Sarcoidosis     History reviewed. No pertinent past surgical history.  No family history on file.  History  Substance Use Topics  . Smoking status: Not on file  . Smokeless tobacco: Not on file  . Alcohol Use:       Review of Systems  Respiratory: Positive for shortness of breath.   All other systems reviewed and are negative.    Allergies  Clindamycin/lincomycin  Home Medications   Current Outpatient Rx  Name Route Sig Dispense Refill  . ALBUTEROL SULFATE (2.5 MG/3ML) 0.083% IN NEBU Nebulization Take 2.5 mg by nebulization every 6 (six) hours as needed.      . CYCLOBENZAPRINE HCL 10 MG PO TABS Oral Take by mouth 3 (three) times daily as needed.        BP 159/98  Pulse 120  Resp 28  SpO2 100%  Physical Exam  Nursing note and vitals reviewed. Constitutional: He is oriented to person, place, and time. He appears well-developed and well-nourished. He appears toxic. He has a sickly appearance. He appears ill. He appears distressed.  HENT:  Head: Normocephalic and  atraumatic.  Eyes: Conjunctivae, EOM and lids are normal. Pupils are equal, round, and reactive to light.  Neck: Normal range of motion. Neck supple. No tracheal deviation present. No mass present.  Cardiovascular: Regular rhythm and normal heart sounds.  Tachycardia present.  Exam reveals no gallop.   No murmur heard. Pulmonary/Chest: Accessory muscle usage present. No stridor. He is in respiratory distress. He has no decreased breath sounds. He has wheezes. He has no rhonchi. He has no rales.  Abdominal: Soft. Normal appearance and bowel sounds are normal. He exhibits no distension. There is no tenderness. There is no rebound and no CVA tenderness.  Musculoskeletal: Normal range of motion. He exhibits no edema and no tenderness.  Neurological: He is alert and oriented to person, place, and time. He has normal strength. No cranial nerve deficit or sensory deficit. GCS eye subscore is 4. GCS verbal subscore is 5. GCS motor subscore is 6.  Skin: Skin is warm and dry. No abrasion and no rash noted.  Psychiatric: His speech is normal and behavior is normal.    ED Course  Procedures (including critical care time)   Labs Reviewed  CBC  DIFFERENTIAL  BASIC METABOLIC PANEL   No results found.   No diagnosis found.    MDM  Pt given albuterol here and wheezing improving, pt remains hypoxic, spoke with triad, pt to be admitted        Toy Baker, MD 01/15/11 [phone]

## 2011-02-15 DIAGNOSIS — H5210 Myopia, unspecified eye: Secondary | ICD-10-CM | POA: Insufficient documentation

## 2011-04-11 ENCOUNTER — Emergency Department (HOSPITAL_COMMUNITY): Payer: 59

## 2011-04-11 ENCOUNTER — Encounter (HOSPITAL_COMMUNITY): Payer: Self-pay | Admitting: *Deleted

## 2011-04-11 ENCOUNTER — Inpatient Hospital Stay (HOSPITAL_COMMUNITY)
Admission: EM | Admit: 2011-04-11 | Discharge: 2011-04-13 | DRG: 194 | Disposition: A | Discharge status: Home / Self Care | Payer: 59 | Attending: Internal Medicine | Admitting: Internal Medicine

## 2011-04-11 ENCOUNTER — Other Ambulatory Visit: Payer: Self-pay

## 2011-04-11 DIAGNOSIS — J209 Acute bronchitis, unspecified: Secondary | ICD-10-CM

## 2011-04-11 DIAGNOSIS — Z79899 Other long term (current) drug therapy: Secondary | ICD-10-CM

## 2011-04-11 DIAGNOSIS — G4733 Obstructive sleep apnea (adult) (pediatric): Secondary | ICD-10-CM | POA: Diagnosis present

## 2011-04-11 DIAGNOSIS — IMO0002 Reserved for concepts with insufficient information to code with codable children: Secondary | ICD-10-CM

## 2011-04-11 DIAGNOSIS — J189 Pneumonia, unspecified organism: Principal | ICD-10-CM | POA: Diagnosis present

## 2011-04-11 DIAGNOSIS — J9801 Acute bronchospasm: Secondary | ICD-10-CM

## 2011-04-11 DIAGNOSIS — J40 Bronchitis, not specified as acute or chronic: Secondary | ICD-10-CM | POA: Diagnosis present

## 2011-04-11 DIAGNOSIS — I10 Essential (primary) hypertension: Secondary | ICD-10-CM | POA: Diagnosis present

## 2011-04-11 DIAGNOSIS — J9610 Chronic respiratory failure, unspecified whether with hypoxia or hypercapnia: Secondary | ICD-10-CM | POA: Diagnosis present

## 2011-04-11 DIAGNOSIS — D869 Sarcoidosis, unspecified: Secondary | ICD-10-CM | POA: Diagnosis present

## 2011-04-11 HISTORY — DX: Dependence on other enabling machines and devices: Z99.89

## 2011-04-11 HISTORY — DX: Chronic respiratory failure, unspecified whether with hypoxia or hypercapnia: J96.10

## 2011-04-11 HISTORY — DX: Obstructive sleep apnea (adult) (pediatric): G47.33

## 2011-04-11 LAB — BASIC METABOLIC PANEL
BUN: 6 mg/dL (ref 6–23)
Calcium: 9.7 mg/dL (ref 8.4–10.5)
Creatinine, Ser: 0.88 mg/dL (ref 0.50–1.35)
GFR calc non Af Amer: >90 mL/min (ref >90)
Glucose, Bld: 118 mg/dL — ABNORMAL HIGH (ref 70–99)

## 2011-04-11 LAB — URINALYSIS, ROUTINE W REFLEX MICROSCOPIC
Bilirubin Urine: NEGATIVE
Hgb urine dipstick: NEGATIVE
Protein, ur: NEGATIVE mg/dL
Specific Gravity, Urine: 1.008 (ref 1.005–1.030)
Urobilinogen, UA: 0.2 mg/dL (ref 0.0–1.0)

## 2011-04-11 LAB — CBC
Hemoglobin: 12.2 g/dL — ABNORMAL LOW (ref 13.0–17.0)
MCH: 27.5 pg (ref 26.0–34.0)
MCV: 83.6 fL (ref 78.0–100.0)
RBC: 4.44 MIL/uL (ref 4.22–5.81)

## 2011-04-11 LAB — DIFFERENTIAL
Eosinophils Absolute: 0.5 10*3/uL (ref 0.0–0.7)
Eosinophils Relative: 5 % (ref 0–5)
Lymphs Abs: 0.7 10*3/uL (ref 0.7–4.0)
Monocytes Relative: 12 % (ref 3–12)

## 2011-04-11 LAB — POCT I-STAT TROPONIN I: Troponin i, poc: 0.01 ng/mL (ref 0.00–0.08)

## 2011-04-11 MED ORDER — METHYLPREDNISOLONE SODIUM SUCC 125 MG IJ SOLR
125.0000 mg | Freq: Once | INTRAMUSCULAR | Status: AC
  Administered 2011-04-11: 125 mg via INTRAVENOUS
  Filled 2011-04-11: qty 2

## 2011-04-11 MED ORDER — ALBUTEROL SULFATE (5 MG/ML) 0.5% IN NEBU
5.0000 mg | INHALATION_SOLUTION | Freq: Once | RESPIRATORY_TRACT | Status: AC
  Administered 2011-04-11: 5 mg via RESPIRATORY_TRACT
  Filled 2011-04-11 (×2): qty 1

## 2011-04-11 MED ORDER — ALBUTEROL SULFATE (5 MG/ML) 0.5% IN NEBU
5.0000 mg | INHALATION_SOLUTION | Freq: Once | RESPIRATORY_TRACT | Status: AC
  Administered 2011-04-11: 5 mg via RESPIRATORY_TRACT
  Filled 2011-04-11: qty 1

## 2011-04-11 MED ORDER — RACEPINEPHRINE HCL 2.25 % IN NEBU
0.5000 mL | INHALATION_SOLUTION | Freq: Once | RESPIRATORY_TRACT | Status: AC
  Administered 2011-04-12: 0.5 mL via RESPIRATORY_TRACT
  Filled 2011-04-11: qty 0.5

## 2011-04-11 MED ORDER — LORAZEPAM 2 MG/ML IJ SOLN
1.0000 mg | Freq: Once | INTRAMUSCULAR | Status: AC
  Administered 2011-04-11: 1 mg via INTRAVENOUS
  Filled 2011-04-11: qty 1

## 2011-04-11 MED ORDER — HYDROMORPHONE HCL PF 1 MG/ML IJ SOLN
1.0000 mg | Freq: Once | INTRAMUSCULAR | Status: AC
  Administered 2011-04-11: 1 mg via INTRAVENOUS
  Filled 2011-04-11: qty 1

## 2011-04-11 MED ORDER — SODIUM CHLORIDE 0.9 % IV BOLUS (SEPSIS)
1000.0000 mL | Freq: Once | INTRAVENOUS | Status: AC
  Administered 2011-04-11: 1000 mL via INTRAVENOUS

## 2011-04-11 MED ORDER — KETOROLAC TROMETHAMINE 30 MG/ML IJ SOLN
30.0000 mg | Freq: Once | INTRAMUSCULAR | Status: AC
  Administered 2011-04-11: 30 mg via INTRAVENOUS
  Filled 2011-04-11: qty 1

## 2011-04-11 NOTE — ED Provider Notes (Addendum)
History     CSN: [id number]  Arrival date & time 04/11/11  2137   First MD Initiated Contact with Patient 04/11/11 2228      Chief Complaint  Patient presents with  . Shortness of Breath    (Consider location/radiation/quality/duration/timing/severity/associated sxs/prior treatment) HPI Comments: States that this feels different than prior episodes of sob from sarcoid.  Wears oxygen only when needed at home.  Patient is a 46 y.o. male presenting with shortness of breath. The history is provided by the patient. No language interpreter was used.  Shortness of Breath  The current episode started 5 to 7 days ago. The onset was gradual. The problem occurs continuously. The problem has been gradually worsening. The problem is moderate. The symptoms are relieved by nothing. The symptoms are aggravated by activity. Associated symptoms include a fever (subjective), cough, shortness of breath and wheezing. Pertinent negatives include no chest pain, no chest pressure, no orthopnea, no rhinorrhea and no sore throat. His temperature was unmeasured prior to arrival. The cough is non-productive. He has had intermittent steroid use. Past medical history comments: sarcoid. He has been behaving normally. He has received no recent medical care.    Past Medical History  Diagnosis Date  . Hypertension   . Sarcoidosis     Past Surgical History  Procedure Date  . Rotator cuff repair     History reviewed. No pertinent family history.  History  Substance Use Topics  . Smoking status: Never Smoker   . Smokeless tobacco: Never Used  . Alcohol Use: Yes      Review of Systems  Constitutional: Positive for fever (subjective) and fatigue. Negative for activity change and appetite change.  HENT: Negative for congestion, sore throat, rhinorrhea, neck pain and neck stiffness.   Respiratory: Positive for cough, shortness of breath and wheezing.   Cardiovascular: Negative for chest pain, palpitations and  orthopnea.  Gastrointestinal: Negative for nausea, vomiting and abdominal pain.  Genitourinary: Negative for dysuria, urgency, frequency and flank pain.  Musculoskeletal: Negative for myalgias, back pain and arthralgias.  Neurological: Negative for dizziness, weakness, light-headedness, numbness and headaches.  All other systems reviewed and are negative.    Allergies  Clindamycin/lincomycin  Home Medications   Current Outpatient Rx  Name Route Sig Dispense Refill  . ALBUTEROL SULFATE (2.5 MG/3ML) 0.083% IN NEBU Nebulization Take 2.5 mg by nebulization every 6 (six) hours as needed.      Marland Kitchen AMITRIPTYLINE HCL 25 MG PO TABS Oral Take 25 mg by mouth at bedtime as needed. For sleep    . CALCIUM + D PO Oral Take 1 tablet by mouth daily.    . COD LIVER OIL PO CAPS Oral Take 1 capsule by mouth daily.    . CYCLOBENZAPRINE HCL 10 MG PO TABS Oral Take 10 mg by mouth 3 (three) times daily as needed. Muscle spasms.    . DEXTROMETHORPHAN-GUAIFENESIN 10-100 MG/5ML PO SYRP Oral Take 10 mLs by mouth every 4 (four) hours.    Marland Kitchen DIPHENHYDRAMINE HCL (SLEEP) 25 MG PO TABS Oral Take 25 mg by mouth as needed. For itching    . GUAIFENESIN ER 600 MG PO TB12 Oral Take 1,200 mg by mouth 2 (two) times daily.    Marland Kitchen LIDOCAINE 5 % EX PTCH Transdermal Place 1 patch onto the skin daily. Remove & Discard patch within 12 hours or as directed by MD    . MULTI-VITAMIN/MINERALS PO TABS Oral Take 1 tablet by mouth daily.    Marland Kitchen PREDNISONE 20 MG  PO TABS Oral Take by mouth See admin instructions. Take 60mg  daily for 3 days, 40 mg daily for 3 days, 20 mg daily for 3 days, 10 mg daily for 3 days, then stop.    Marland Kitchen PREGABALIN 200 MG PO CAPS Oral Take 200 mg by mouth 2 (two) times daily.    Marland Kitchen PSEUDOEPHEDRINE HCL 30 MG PO TABS Oral Take 30 mg by mouth every 4 (four) hours as needed. Congestion.    Marland Kitchen RACEPINEPHRINE HCL 2.25 % IN NEBU Nebulization Take 0.5 mLs by nebulization every 4 (four) hours as needed. Shortness of breath.    .  TRAZODONE HCL 100 MG PO TABS Oral Take 100 mg by mouth at bedtime as needed. sleep    . TRAZODONE HCL PO Oral Take 1 tablet by mouth at bedtime.    Marland Kitchen PREDNISONE 5 MG PO TABS  Take 40 mg daily for 3 days, then 30 mg daily for 3 days, then 20 mg daily for 3 days, then 10 mg daily for 3 days, then stop.      BP 141/89  Pulse 115  Temp(Src) 98.9 F (37.2 C) (Oral)  Resp 23  SpO2 100%  Physical Exam  Nursing note and vitals reviewed. Constitutional: He is oriented to person, place, and time. He appears well-developed and well-nourished. No distress.       Hoarse voice  HENT:  Head: Normocephalic and atraumatic.  Mouth/Throat: Oropharynx is clear and moist.       Prominent epiglottis which was visualized on routine oropharyngeal examination  Eyes: Conjunctivae and EOM are normal. Pupils are equal, round, and reactive to light.  Neck: Normal range of motion. Neck supple.  Cardiovascular: Regular rhythm, normal heart sounds and intact distal pulses.        Tachycardic rate  Pulmonary/Chest: He has wheezes (faint).       Tachypnea and speaking in fragmented sentences.  Did not appreciate stridor but may have component of vocal cord dysfunction  Abdominal: Soft. Bowel sounds are normal. There is no tenderness.  Musculoskeletal: Normal range of motion. He exhibits no edema and no tenderness.  Lymphadenopathy:    He has no cervical adenopathy.  Neurological: He is alert and oriented to person, place, and time. No cranial nerve deficit.  Skin: Skin is warm and dry. No rash noted.    ED Course  Procedures (including critical care time)  Labs Reviewed  CBC - Abnormal; Notable for the following:    Hemoglobin 12.2 (*)    HCT 37.1 (*)    All other components within normal limits  DIFFERENTIAL - Abnormal; Notable for the following:    Lymphocytes Relative 8 (*)    Monocytes Absolute 1.1 (*)    All other components within normal limits  BASIC METABOLIC PANEL - Abnormal; Notable for the  following:    Sodium 134 (*)    Glucose, Bld 118 (*)    All other components within normal limits  URINALYSIS, ROUTINE W REFLEX MICROSCOPIC  PRO B NATRIURETIC PEPTIDE  POCT I-STAT TROPONIN I   Dg Neck Soft Tissue  04/11/2011  *RADIOLOGY REPORT*  Clinical Data: Choking.  NECK SOFT TISSUES - 1+ VIEW  Comparison: None.  Findings: The epiglottis is normal.  The aryepiglottic folds are normal.  There is air in the vallecular air space.  No prevertebral or retropharyngeal soft tissue swelling or abnormal air collections. No radiopaque foreign body.  IMPRESSION: Unremarkable soft tissue examination of the neck.  Original Report Authenticated By: P. MARK GALLERANI,  M.D.     1. Sarcoid   2. Acute bronchitis       MDM  Laboratory studies are relatively unremarkable. Soft tissue lateral neck was performed as he had a prominent epiglottis. CT angio chest is pending at the time of this dictation to evaluate for pe. He arrived tachycardic and hypoxic. He states this is different than prior sarcoid flares. He received steroids and albuterol. He had some upper airway type wheezing which could be secondary to vocal cord dysfunction. I also administered racemic epinephrine and Ativan. He receives pain medication. IV fluids. Will likely require admission but will be reevaluated after testing is complete.  Signed out to my colleague dr Preston Fleeting who will assume care        Dayton Bailiff, MD 04/12/11 0010  CT scan has come back showing infiltrates in the left upper and left lower lobes. Reexam shows ongoing wheezing. Is given another albuterol and Atrovent nebulizer treatment, and he is started on antibiotics of Rocephin and Zithromax. Dr. Joneen Roach [facility] has been consulted and will come and admit the patient.  CRITICAL CARE Performed by: Ricard Dillon MD   Total critical care time: 45 minutes  Critical care time was exclusive of separately billable procedures and treating  other patients.  Critical care was necessary to treat or prevent imminent or life-threatening deterioration.  Critical care was time spent personally by me on the following activities: development of treatment plan with patient and/or surrogate as well as nursing, discussions with consultants, evaluation of patient's response to treatment, examination of patient, obtaining history from patient or surrogate, ordering and performing treatments and interventions, ordering and review of laboratory studies, ordering and review of radiographic studies, pulse oximetry and re-evaluation of patient's condition.   Dione Booze, MD 04/12/11 [phone]

## 2011-04-11 NOTE — ED Notes (Signed)
Pt reports using his nebulizer twice today. Medication used was racepinephrine. Pt also reports using his albuterol inhaler multiple times today with mild relief

## 2011-04-11 NOTE — ED Notes (Signed)
Pt ambulate to bathroom without difficulty.

## 2011-04-11 NOTE — ED Notes (Signed)
Pt o2 sat after ambulating to the restroom and back on room air was 84%. Pt put back onto o2 with sat of 95% HR of 125

## 2011-04-11 NOTE — ED Notes (Signed)
Pt states he has continual shortness of breath for two days. Pt ambulated to room presenting with shortness of breath and a hoarse voice. Pt states his throat is soar and feels tight. Pt able to talk in complete sentences

## 2011-04-12 ENCOUNTER — Encounter (HOSPITAL_COMMUNITY): Payer: Self-pay | Admitting: Family Medicine

## 2011-04-12 DIAGNOSIS — J189 Pneumonia, unspecified organism: Secondary | ICD-10-CM

## 2011-04-12 DIAGNOSIS — J40 Bronchitis, not specified as acute or chronic: Secondary | ICD-10-CM | POA: Diagnosis present

## 2011-04-12 LAB — CBC
HCT: 36 % — ABNORMAL LOW (ref 39.0–52.0)
Hemoglobin: 11.5 g/dL — ABNORMAL LOW (ref 13.0–17.0)
MCH: 26.6 pg (ref 26.0–34.0)
MCHC: 31.9 g/dL (ref 30.0–36.0)
MCV: 83.3 fL (ref 78.0–100.0)
Platelets: 443 10*3/uL — ABNORMAL HIGH (ref 150–400)
RBC: 4.32 MIL/uL (ref 4.22–5.81)
RDW: 12.6 % (ref 11.5–15.5)
WBC: 8.2 10*3/uL (ref 4.0–10.5)

## 2011-04-12 LAB — BASIC METABOLIC PANEL
BUN: 8 mg/dL (ref 6–23)
CO2: 26 mEq/L (ref 19–32)
Calcium: 8.9 mg/dL (ref 8.4–10.5)
GFR calc non Af Amer: >90 mL/min (ref >90)
Glucose, Bld: 219 mg/dL — ABNORMAL HIGH (ref 70–99)
Sodium: 134 mEq/L — ABNORMAL LOW (ref 135–145)

## 2011-04-12 MED ORDER — ENOXAPARIN SODIUM 40 MG/0.4ML ~~LOC~~ SOLN
40.0000 mg | SUBCUTANEOUS | Status: DC
  Administered 2011-04-12 – 2011-04-13 (×2): 40 mg via SUBCUTANEOUS
  Filled 2011-04-12 (×3): qty 0.4

## 2011-04-12 MED ORDER — IOHEXOL 300 MG/ML  SOLN
100.0000 mL | Freq: Once | INTRAMUSCULAR | Status: AC | PRN
  Administered 2011-04-12: 100 mL via INTRAVENOUS

## 2011-04-12 MED ORDER — ALBUTEROL SULFATE (5 MG/ML) 0.5% IN NEBU
2.5000 mg | INHALATION_SOLUTION | Freq: Once | RESPIRATORY_TRACT | Status: AC
  Administered 2011-04-12: 2.5 mg via RESPIRATORY_TRACT
  Filled 2011-04-12: qty 0.5

## 2011-04-12 MED ORDER — IPRATROPIUM BROMIDE 0.02 % IN SOLN
0.5000 mg | RESPIRATORY_TRACT | Status: DC | PRN
  Administered 2011-04-12 – 2011-04-13 (×2): 0.5 mg via RESPIRATORY_TRACT
  Filled 2011-04-12 (×2): qty 2.5

## 2011-04-12 MED ORDER — PREGABALIN 100 MG PO CAPS
200.0000 mg | ORAL_CAPSULE | Freq: Two times a day (BID) | ORAL | Status: DC
  Administered 2011-04-12 – 2011-04-13 (×3): 200 mg via ORAL
  Filled 2011-04-12 (×3): qty 2

## 2011-04-12 MED ORDER — GUAIFENESIN-DM 100-10 MG/5ML PO SYRP
10.0000 mL | ORAL_SOLUTION | ORAL | Status: DC
  Administered 2011-04-12 – 2011-04-13 (×9): 10 mL via ORAL
  Filled 2011-04-12 (×13): qty 10

## 2011-04-12 MED ORDER — METHYLPREDNISOLONE SODIUM SUCC 125 MG IJ SOLR
60.0000 mg | INTRAMUSCULAR | Status: DC
  Administered 2011-04-13: 60 mg via INTRAVENOUS
  Filled 2011-04-12 (×2): qty 0.96

## 2011-04-12 MED ORDER — IPRATROPIUM BROMIDE 0.02 % IN SOLN
0.5000 mg | Freq: Once | RESPIRATORY_TRACT | Status: AC
  Administered 2011-04-12: 0.5 mg via RESPIRATORY_TRACT
  Filled 2011-04-12: qty 2.5

## 2011-04-12 MED ORDER — DEXTROSE 5 % IV SOLN
1.0000 g | INTRAVENOUS | Status: DC
  Administered 2011-04-12 – 2011-04-13 (×2): 1 g via INTRAVENOUS
  Filled 2011-04-12 (×3): qty 10

## 2011-04-12 MED ORDER — ALBUTEROL SULFATE (5 MG/ML) 0.5% IN NEBU
2.5000 mg | INHALATION_SOLUTION | RESPIRATORY_TRACT | Status: DC | PRN
  Administered 2011-04-12 – 2011-04-13 (×2): 2.5 mg via RESPIRATORY_TRACT
  Filled 2011-04-12 (×2): qty 0.5

## 2011-04-12 MED ORDER — ALUM & MAG HYDROXIDE-SIMETH 200-200-20 MG/5ML PO SUSP
30.0000 mL | Freq: Four times a day (QID) | ORAL | Status: DC | PRN
  Administered 2011-04-12: 30 mL via ORAL
  Filled 2011-04-12: qty 30

## 2011-04-12 MED ORDER — METHYLPREDNISOLONE SODIUM SUCC 125 MG IJ SOLR
60.0000 mg | Freq: Two times a day (BID) | INTRAMUSCULAR | Status: DC
  Administered 2011-04-12: 60 mg via INTRAVENOUS
  Filled 2011-04-12: qty 2
  Filled 2011-04-12 (×2): qty 0.96

## 2011-04-12 MED ORDER — SODIUM CHLORIDE 0.9 % IJ SOLN: 3.0000 mL | INTRAMUSCULAR | Status: DC | PRN

## 2011-04-12 MED ORDER — GUAIFENESIN ER 600 MG PO TB12
1200.0000 mg | ORAL_TABLET | Freq: Two times a day (BID) | ORAL | Status: DC
  Administered 2011-04-12 – 2011-04-13 (×3): 1200 mg via ORAL
  Filled 2011-04-12 (×4): qty 2

## 2011-04-12 MED ORDER — BENZONATATE 100 MG PO CAPS
100.0000 mg | ORAL_CAPSULE | Freq: Three times a day (TID) | ORAL | Status: DC | PRN
  Administered 2011-04-12: 100 mg via ORAL
  Filled 2011-04-12 (×2): qty 1

## 2011-04-12 MED ORDER — ONDANSETRON HCL 4 MG PO TABS: 4.0000 mg | ORAL_TABLET | Freq: Four times a day (QID) | ORAL | Status: DC | PRN

## 2011-04-12 MED ORDER — DEXTROSE 5 % IV SOLN
500.0000 mg | INTRAVENOUS | Status: DC
  Administered 2011-04-13: 500 mg via INTRAVENOUS
  Filled 2011-04-12 (×2): qty 500

## 2011-04-12 MED ORDER — DEXTROMETHORPHAN-GUAIFENESIN 10-100 MG/5ML PO SYRP: 10.0000 mL | ORAL_SOLUTION | ORAL | Status: DC

## 2011-04-12 MED ORDER — ONDANSETRON HCL 4 MG/2ML IJ SOLN: 4.0000 mg | Freq: Four times a day (QID) | INTRAMUSCULAR | Status: DC | PRN

## 2011-04-12 MED ORDER — DEXTROSE 5 % IV SOLN
500.0000 mg | Freq: Once | INTRAVENOUS | Status: AC
  Administered 2011-04-12: 500 mg via INTRAVENOUS
  Filled 2011-04-12: qty 500

## 2011-04-12 NOTE — Progress Notes (Signed)
RT Note: Placed pt on Auto BIPAP with full face mack and 2L 02 bleed in. Pt tolerating well, RT will continue to monitor.

## 2011-04-12 NOTE — Progress Notes (Signed)
Inpatient Diabetes Program Recommendations  AACE/ADA: New Consensus Statement on Inpatient Glycemic Control (2009)  Target Ranges:  Prepandial:   less than 140 mg/dL      Peak postprandial:   less than 180 mg/dL (1-2 hours)      Critically ill patients:  140 - 180 mg/dL   Reason for Visit: Hyperglycemia  Results for HADDON, FYFE (MRN [id number]) as of 04/12/2011 13:45  Ref. Range 04/11/2011 22:20 04/12/2011 05:03  Glucose Latest Range: 70-99 mg/dL 409 (H) 811 (H)   Inpatient Diabetes Program Recommendations HgbA1C: MD please consider ordering HgbA1C.  Lab glucose 219 this morning after solumedrol started last night.  Note:  No history of diabetes noted.  B-met to be drawn tomorrow morning.  If CBG still elevated and patient not discharged, consider CBG's and correction-- depending upon AM lab glucose.  Thank you. [phone])

## 2011-04-12 NOTE — Progress Notes (Signed)
   CARE MANAGEMENT NOTE 04/12/2011  Patient:  [name],[name]   Account Number:  [id number]  Date Initiated:  04/12/2011  Documentation initiated by:  Lanier Clam  Subjective/Objective Assessment:   ADMITTED W/SOB.HX: SARCOIDOSIS,SLEEP APNEA.     Action/Plan:   FROM HOME ALONE,HOME 02   Anticipated DC Date:  04/15/2011   Anticipated DC Plan:  HOME/SELF CARE         Choice offered to / List presented to:             Status of service:  In process, will continue to follow Medicare Important Message given?   (If response is "NO", the following Medicare IM given date fields will be blank) Date Medicare IM given:   Date Additional Medicare IM given:    Discharge Disposition:    Per UR Regulation:  Reviewed for med. necessity/level of care/duration of stay  If discussed at Long Length of Stay Meetings, dates discussed:    Comments:  04/12/11 [facility] RN,BSN NCM [phone]

## 2011-04-12 NOTE — Progress Notes (Signed)
Subjective: Feels much less SOB, breathing is not as labored as last night.  Objective: Vital signs in last 24 hours: Temp:  [97.7 F (36.5 C)-99.9 F (37.7 C)] 97.7 F (36.5 C) (04/01 0630) Pulse Rate:  [107-129] 109  (04/01 0630) Resp:  [18-24] 20  (04/01 0630) BP: (107-156)/(71-99) 125/86 mmHg (04/01 0630) SpO2:  [88 %-100 %] 96 % (04/01 0630) Weight:  [76.204 kg (168 lb)] 76.204 kg (168 lb) (04/01 0401) Weight change:  Last BM Date: 04/11/11  Intake/Output from previous day:       Physical Exam: General: Alert, awake, oriented x3, in no acute distress. HEENT: No bruits, no goiter, cushingoid face. Heart: Regular rate and rhythm, without murmurs, rubs, gallops. Lungs: Mild bilateral ronchi. Abdomen: Soft, nontender, nondistended, positive bowel sounds. Extremities: No clubbing cyanosis or edema with positive pedal pulses. Neuro: Grossly intact, nonfocal.    Lab Results: Basic Metabolic Panel:  Basename 04/12/11 0503 04/11/11 2220  NA 134* 134*  K 4.0 3.6  CL 97 96  CO2 26 24  GLUCOSE 219* 118*  BUN 8 6  CREATININE 0.94 0.88  CALCIUM 8.9 9.7  MG -- --  PHOS -- --   CBC:  Basename 04/12/11 0503 04/11/11 2220  WBC 8.2 9.4  NEUTROABS -- 7.1  HGB 11.5* 12.2*  HCT 36.0* 37.1*  MCV 83.3 83.6  PLT 443* 391   BNP:  Basename 04/11/11 2220  PROBNP 62.0   Urinalysis:  Basename 04/11/11 2250  COLORURINE YELLOW  LABSPEC 1.008  PHURINE 6.0  GLUCOSEU NEGATIVE  HGBUR NEGATIVE  BILIRUBINUR NEGATIVE  KETONESUR NEGATIVE  PROTEINUR NEGATIVE  UROBILINOGEN 0.2  NITRITE NEGATIVE  LEUKOCYTESUR NEGATIVE    Studies/Results: Dg Neck Soft Tissue  04/11/2011  *RADIOLOGY REPORT*  Clinical Data: Choking.  NECK SOFT TISSUES - 1+ VIEW  Comparison: None.  Findings: The epiglottis is normal.  The aryepiglottic folds are normal.  There is air in the vallecular air space.  No prevertebral or retropharyngeal soft tissue swelling or abnormal air collections. No radiopaque  foreign body.  IMPRESSION: Unremarkable soft tissue examination of the neck.  Original Report Authenticated By: P. Loralie Champagne, M.D.   Ct Angio Chest W/cm &/or Wo Cm  04/12/2011  *RADIOLOGY REPORT*  Clinical Data: Shortness of breath.  CT ANGIOGRAPHY CHEST  Technique:  Multidetector CT imaging of the chest using the standard protocol during bolus administration of intravenous contrast. Multiplanar reconstructed images including MIPs were obtained and reviewed to evaluate the vascular anatomy.  Contrast: OMNIPAQUE IOHEXOL 300 MG/ML IJ SOLN  Comparison: Chest CT 01/15/2009.  Findings: The chest wall is unremarkable and stable.  Small scattered supraclavicular and axillary lymph nodes are noted. Thyroid gland is grossly normal.  The bony thorax is intact.  No acute rib fractures.  The heart is normal in size and stable.  No pericardial effusion. The aorta is normal in caliber.  No dissection.  The esophagus is grossly normal.  There are enlarged mediastinal and hilar lymph nodes which are stable and consistent with known sarcoidosis.  The pulmonary arterial tree is fairly well opacified.  No filling defects to suggest pulmonary emboli.  Examination of the lung parenchyma demonstrates chronic lung disease related to known sarcoidosis with severe scarring and diffuse nodularity.  The large cystic cavity in the right lung has enlarged since prior study.  Ground-glass opacity in the left upper lobe and left lower lobes could suggest acute overlying infection.  No pleural effusion or obvious pulmonary edema.  The upper abdomen is  grossly normal.  IMPRESSION:  1.  No CT findings for pulmonary embolism. 2.  Normal thoracic aorta. 3.  Stable changes of sarcoidosis with mediastinal/hilar adenopathy and fibrotic lung disease. 4.  Probable superimposed acute left upper lobe and lower lobe infiltrates.  Original Report Authenticated By: P. Loralie Champagne, M.D.    Medications: Scheduled Meds:   . albuterol  2.5 mg  Nebulization Once  . albuterol  5 mg Nebulization Once  . albuterol  5 mg Nebulization Once  . azithromycin (ZITHROMAX) 500 MG IVPB  500 mg Intravenous Once  . azithromycin  500 mg Intravenous Q24H  . cefTRIAXone (ROCEPHIN) IVPB 1 gram/50 mL D5W  1 g Intravenous Q24H  . enoxaparin  40 mg Subcutaneous Q24H  . guaiFENesin  1,200 mg Oral BID  . guaiFENesin-dextromethorphan  10 mL Oral Q4H  .  HYDROmorphone (DILAUDID) injection  1 mg Intravenous Once  . ipratropium  0.5 mg Nebulization Once  . ketorolac  30 mg Intravenous Once  . LORazepam  1 mg Intravenous Once  . methylPREDNISolone (SOLU-MEDROL) injection  125 mg Intravenous Once  . methylPREDNISolone (SOLU-MEDROL) injection  60 mg Intravenous Q24H  . pregabalin  200 mg Oral BID  . Racepinephrine HCl  0.5 mL Nebulization Once  . sodium chloride  1,000 mL Intravenous Once  . DISCONTD: Dextromethorphan-Guaifenesin  10 mL Oral Q4H  . DISCONTD: methylPREDNISolone (SOLU-MEDROL) injection  60 mg Intravenous Q12H   Continuous Infusions:  PRN Meds:.albuterol, alum & mag hydroxide-simeth, benzonatate, iohexol, ipratropium, ondansetron (ZOFRAN) IV, ondansetron, sodium chloride  Assessment/Plan:  Principal Problem:  *CAP (community acquired pneumonia) Active Problems:  Sarcoid  OSA on CPAP  Tracheobronchitis   #1 CAP: Cont rocephin/azithro. Clinically improving. Afebrile, no leukocytosis.  #2 Tracheobronchitis: no stridor or wheezing today. Titrate steroids.  #3 Dispo: likely DC home in am.    LOS: 1 day   [facility] Pager: [phone] 04/12/2011, 8:37 AM

## 2011-04-12 NOTE — H&P (Signed)
PCP:   [name] Pulmonologist Dr. Mordecai [name] at [facility]   Chief Complaint:  Shortness of breath  HPI: This is a 46 year old gentleman known history of sarcoidosis and obstructive sleep apnea on CPAP. He states for the past 3 days or so he's had increasing shortness of breath and wheezing. He states his symptoms have gotten better then returned intermittently. He denies any nausea or vomiting. He states no fever but he's had chills. He is a cough that is productive of yellow and green sputum. He finally felt bad enough he came to the ER.  Review of Systems: Positives bolded   anorexia, fever, weight loss,, vision loss, decreased hearing, hoarseness, chest pain, syncope, dyspnea on exertion, peripheral edema, balance deficits, hemoptysis, abdominal pain, melena, hematochezia, severe indigestion/heartburn, hematuria, incontinence, genital sores, muscle weakness, suspicious skin lesions, transient blindness, difficulty walking, depression, unusual weight change, abnormal bleeding, enlarged lymph nodes, angioedema, and breast masses.  Past Medical History: Past Medical History  Diagnosis Date  . Hypertension   . Sarcoidosis   . OSA on CPAP   . Chronic respiratory failure     hom oxygen PRN   Past Surgical History  Procedure Date  . Rotator cuff repair     Medications: Prior to Admission medications   Medication Sig Start Date End Date Taking? Authorizing Provider  albuterol (PROVENTIL) (2.5 MG/3ML) 0.083% nebulizer solution Take 2.5 mg by nebulization every 6 (six) hours as needed.     Yes Historical Provider, MD  amitriptyline (ELAVIL) 25 MG tablet Take 25 mg by mouth at bedtime as needed. For sleep   Yes Historical Provider, MD  Calcium Carbonate-Vitamin D (CALCIUM + D PO) Take 1 tablet by mouth daily.   Yes Historical Provider, MD  [facility] Liver Oil CAPS Take 1 capsule by mouth daily.   Yes Historical Provider, MD  cyclobenzaprine (FLEXERIL) 10 MG tablet Take 10 mg by mouth 3 (three) times  daily as needed. Muscle spasms.   Yes Historical Provider, MD  Dextromethorphan-Guaifenesin (TUSSIN DM) 10-100 MG/5ML liquid Take 10 mLs by mouth every 4 (four) hours.   Yes Historical Provider, MD  diphenhydrAMINE (SOMINEX) 25 MG tablet Take 25 mg by mouth as needed. For itching   Yes Historical Provider, MD  guaiFENesin (MUCINEX) 600 MG 12 hr tablet Take 1,200 mg by mouth 2 (two) times daily.   Yes Historical Provider, MD  lidocaine (LIDODERM) 5 % Place 1 patch onto the skin daily. Remove & Discard patch within 12 hours or as directed by MD   Yes Historical Provider, MD  Multiple Vitamins-Minerals (MULTIVITAMIN WITH MINERALS) tablet Take 1 tablet by mouth daily.   Yes Historical Provider, MD  predniSONE (DELTASONE) 20 MG tablet Take by mouth See admin instructions. Take 60mg  daily for 3 days, 40 mg daily for 3 days, 20 mg daily for 3 days, 10 mg daily for 3 days, then stop.   Yes Historical Provider, MD  pregabalin (LYRICA) 200 MG capsule Take 200 mg by mouth 2 (two) times daily.   Yes Historical Provider, MD  pseudoephedrine (SUDAFED) 30 MG tablet Take 30 mg by mouth every 4 (four) hours as needed. Congestion.   Yes Historical Provider, MD  Racepinephrine HCl 2.25 % NEBU nebulizer solution Take 0.5 mLs by nebulization every 4 (four) hours as needed. Shortness of breath.   Yes Historical Provider, MD  traZODone (DESYREL) 100 MG tablet Take 100 mg by mouth at bedtime as needed. sleep   Yes Historical Provider, MD  TRAZODONE HCL PO Take 1  tablet by mouth at bedtime.   Yes Historical Provider, MD  predniSONE (DELTASONE) 5 MG tablet Take 40 mg daily for 3 days, then 30 mg daily for 3 days, then 20 mg daily for 3 days, then 10 mg daily for 3 days, then stop. 01/15/11   Laveda Norman, MD    Allergies:   Allergies  Allergen Reactions  . Clindamycin/Lincomycin Swelling    Social History:  reports that he has never smoked. He has never used smokeless tobacco. He reports that he drinks alcohol. He reports  that he does not use illicit drugs.  Family History: History reviewed. No pertinent family history.  Physical Exam: Filed Vitals:   04/11/11 2157 04/11/11 2344 04/11/11 2349 04/12/11 0039  BP:  141/89 141/89   Pulse:  116 115   Temp:   98.9 F (37.2 C)   TempSrc:   Oral   Resp:  24 23   SpO2: 96% 99% 100% 95%    General:  Alert and oriented times three, well developed and nourished, no acute distress Eyes: PERRLA, pink conjunctiva, no scleral icterus ENT: Moist oral mucosa, neck supple, no thyromegaly Lungs: clear to ascultation, wheezing greatest bilateral neck, mild transmitted wheezing in lung no crackles, no use of accessory muscles, no stridor Cardiovascular: regular rate and rhythm, no regurgitation, no gallops, no murmurs. No carotid bruits, no JVD Abdomen: soft, positive BS, non-tender, non-distended, no organomegaly, not an acute abdomen GU: not examined Neuro: CN II - XII grossly intact, sensation intact Musculoskeletal: strength 5/5 all extremities, no clubbing, cyanosis or edema Skin: no rash, no subcutaneous crepitation, no decubitus Psych: appropriate patient   Labs on Admission:   Basename 04/11/11 2220  NA 134*  K 3.6  CL 96  CO2 24  GLUCOSE 118*  BUN 6  CREATININE 0.88  CALCIUM 9.7  MG --  PHOS --   No results found for this basename: AST:2,ALT:2,ALKPHOS:2,BILITOT:2,PROT:2,ALBUMIN:2 in the last 72 hours No results found for this basename: LIPASE:2,AMYLASE:2 in the last 72 hours  Basename 04/11/11 2220  WBC 9.4  NEUTROABS 7.1  HGB 12.2*  HCT 37.1*  MCV 83.6  PLT 391   No results found for this basename: CKTOTAL:3,CKMB:3,CKMBINDEX:3,TROPONINI:3 in the last 72 hours No components found with this basename: POCBNP:3 No results found for this basename: DDIMER:2 in the last 72 hours No results found for this basename: HGBA1C:2 in the last 72 hours No results found for this basename: CHOL:2,HDL:2,LDLCALC:2,TRIG:2,CHOLHDL:2,LDLDIRECT:2 in the last 72  hours No results found for this basename: TSH,T4TOTAL,FREET3,T3FREE,THYROIDAB in the last 72 hours No results found for this basename: VITAMINB12:2,FOLATE:2,FERRITIN:2,TIBC:2,IRON:2,RETICCTPCT:2 in the last 72 hours  Micro Results: No results found for this or any previous visit (from the past 240 hour(s)). Results for [name], [name] (MRN [id number]) as of 04/12/2011 02:24  Ref. Range 04/11/2011 22:50  Color, Urine Latest Range: YELLOW  YELLOW  APPearance Latest Range: CLEAR  CLEAR  Specific Gravity, Urine Latest Range: 1.005-1.030  1.008  pH Latest Range: 5.0-8.0  6.0  Glucose, UA Latest Range: NEGATIVE mg/dL NEGATIVE  Bilirubin Urine Latest Range: NEGATIVE  NEGATIVE  Ketones, ur Latest Range: NEGATIVE mg/dL NEGATIVE  Protein Latest Range: NEGATIVE mg/dL NEGATIVE  Urobilinogen, UA Latest Range: 0.0-1.0 mg/dL 0.2  Nitrite Latest Range: NEGATIVE  NEGATIVE  Leukocytes, UA Latest Range: NEGATIVE  NEGATIVE  Hgb urine dipstick Latest Range: NEGATIVE  NEGATIVE    Radiological Exams on Admission: Dg Neck Soft Tissue  04/11/2011  *RADIOLOGY REPORT*  Clinical Data: Choking.  NECK SOFT TISSUES -  1+ VIEW  Comparison: None.  Findings: The epiglottis is normal.  The aryepiglottic folds are normal.  There is air in the vallecular air space.  No prevertebral or retropharyngeal soft tissue swelling or abnormal air collections. No radiopaque foreign body.  IMPRESSION: Unremarkable soft tissue examination of the neck.  Original Report Authenticated By: P. Loralie Champagne, M.D.   Ct Angio Chest W/cm &/or Wo Cm  04/12/2011  *RADIOLOGY REPORT*  Clinical Data: Shortness of breath.  CT ANGIOGRAPHY CHEST  Technique:  Multidetector CT imaging of the chest using the standard protocol during bolus administration of intravenous contrast. Multiplanar reconstructed images including MIPs were obtained and reviewed to evaluate the vascular anatomy.  Contrast: OMNIPAQUE IOHEXOL 300 MG/ML IJ SOLN  Comparison: Chest CT  01/15/2009.  Findings: The chest wall is unremarkable and stable.  Small scattered supraclavicular and axillary lymph nodes are noted. Thyroid gland is grossly normal.  The bony thorax is intact.  No acute rib fractures.  The heart is normal in size and stable.  No pericardial effusion. The aorta is normal in caliber.  No dissection.  The esophagus is grossly normal.  There are enlarged mediastinal and hilar lymph nodes which are stable and consistent with known sarcoidosis.  The pulmonary arterial tree is fairly well opacified.  No filling defects to suggest pulmonary emboli.  Examination of the lung parenchyma demonstrates chronic lung disease related to known sarcoidosis with severe scarring and diffuse nodularity.  The large cystic cavity in the right lung has enlarged since prior study.  Ground-glass opacity in the left upper lobe and left lower lobes could suggest acute overlying infection.  No pleural effusion or obvious pulmonary edema.  The upper abdomen is grossly normal.  IMPRESSION:  1.  No CT findings for pulmonary embolism. 2.  Normal thoracic aorta. 3.  Stable changes of sarcoidosis with mediastinal/hilar adenopathy and fibrotic lung disease. 4.  Probable superimposed acute left upper lobe and lower lobe infiltrates.  Original Report Authenticated By: P. Loralie Champagne, M.D.    Assessment/Plan Present on Admission:  .Tracheobronchitis Possible pneumonia Admit to telemetry Continue Solu-Medrol IV every 12 and antibiotics  Patient states he has oxygen at home as needed, will order oxygen Tessalon Perles as needed for sputum cultures ordered  Obstructive sleep apnea CPAP ordered Sarcoidosis Resume home medications  Hypertension Resume home medications   Full code DVT prophylaxis Team 3/Dr. Ardyth Harps   .Sarcoid .OSA on CPAP  [name] 04/12/2011, 2:46 AM

## 2011-04-12 NOTE — ED Notes (Signed)
Attempted to call report. Floor RN unable to accept report.  

## 2011-04-13 LAB — CBC
MCH: 26.7 pg (ref 26.0–34.0)
MCV: 83.9 fL (ref 78.0–100.0)
Platelets: 485 10*3/uL — ABNORMAL HIGH (ref 150–400)
RDW: 12.6 % (ref 11.5–15.5)
WBC: 11.8 10*3/uL — ABNORMAL HIGH (ref 4.0–10.5)

## 2011-04-13 LAB — BASIC METABOLIC PANEL
CO2: 29 mEq/L (ref 19–32)
Calcium: 9 mg/dL (ref 8.4–10.5)
Creatinine, Ser: 0.99 mg/dL (ref 0.50–1.35)
GFR calc non Af Amer: >90 mL/min (ref >90)
Glucose, Bld: 131 mg/dL — ABNORMAL HIGH (ref 70–99)
Sodium: 137 mEq/L (ref 135–145)

## 2011-04-13 MED ORDER — PREDNISONE 10 MG PO TABS: 10.0000 mg | ORAL_TABLET | Freq: Every day | ORAL | Status: DC

## 2011-04-13 MED ORDER — MOXIFLOXACIN HCL 400 MG PO TABS: 400.0000 mg | ORAL_TABLET | Freq: Every day | ORAL | Status: DC

## 2011-04-13 MED ORDER — LEVOFLOXACIN 500 MG PO TABS: 500.0000 mg | ORAL_TABLET | Freq: Every day | ORAL | Status: AC

## 2011-04-13 MED ORDER — ALBUTEROL SULFATE HFA 108 (90 BASE) MCG/ACT IN AERS: 2.0000 | INHALATION_SPRAY | Freq: Four times a day (QID) | RESPIRATORY_TRACT | Status: DC | PRN

## 2011-04-13 NOTE — Progress Notes (Signed)
   CARE MANAGEMENT NOTE 04/13/2011  Patient:  [name],[name]   Account Number:  [id number]  Date Initiated:  04/12/2011  Documentation initiated by:  Lanier Clam  Subjective/Objective Assessment:   ADMITTED W/SOB.HX: SARCOIDOSIS,SLEEP APNEA.     Action/Plan:   FROM HOME ALONE,HOME 02   Anticipated DC Date:  04/13/2011   Anticipated DC Plan:  HOME/SELF CARE         Choice offered to / List presented to:             Status of service:  Completed, signed off Medicare Important Message given?   (If response is "NO", the following Medicare IM given date fields will be blank) Date Medicare IM given:   Date Additional Medicare IM given:    Discharge Disposition:  HOME/SELF CARE  Per UR Regulation:  Reviewed for med. necessity/level of care/duration of stay  If discussed at Long Length of Stay Meetings, dates discussed:    Comments:  04/12/11 [facility] RN,BSN NCM [phone]

## 2011-04-13 NOTE — Discharge Summary (Signed)
Physician Discharge Summary  Patient ID: [name] MRN: [id number] DOB/AGE: Feb 02, 1965 46 y.o.  Admit date: 04/11/2011 Discharge date: 04/13/2011  Primary Care Physician:  No primary provider on file.   Discharge Diagnoses:    Principal Problem:  *CAP (community acquired pneumonia) Active Problems:  Sarcoid  OSA on CPAP  Tracheobronchitis    Medication List  As of 04/13/2011 11:21 AM   STOP taking these medications         predniSONE 5 MG tablet      pseudoephedrine 30 MG tablet      Racepinephrine HCl 2.25 % Nebu nebulizer solution      TRAZODONE HCL PO         TAKE these medications         albuterol (2.5 MG/3ML) 0.083% nebulizer solution   Commonly known as: PROVENTIL   Take 2.5 mg by nebulization every 6 (six) hours as needed.      albuterol 108 (90 BASE) MCG/ACT inhaler   Commonly known as: PROVENTIL HFA;VENTOLIN HFA   Inhale 2 puffs into the lungs every 6 (six) hours as needed for wheezing or shortness of breath.      amitriptyline 25 MG tablet   Commonly known as: ELAVIL   Take 25 mg by mouth at bedtime as needed. For sleep      CALCIUM + D PO   Take 1 tablet by mouth daily.      Cod Liver Oil Caps   Take 1 capsule by mouth daily.      cyclobenzaprine 10 MG tablet   Commonly known as: FLEXERIL   Take 10 mg by mouth 3 (three) times daily as needed. Muscle spasms.      diphenhydrAMINE 25 MG tablet   Commonly known as: SOMINEX   Take 25 mg by mouth as needed. For itching      guaiFENesin 600 MG 12 hr tablet   Commonly known as: MUCINEX   Take 1,200 mg by mouth 2 (two) times daily.      lidocaine 5 %   Commonly known as: LIDODERM   Place 1 patch onto the skin daily. Remove & Discard patch within 12 hours or as directed by MD      moxifloxacin 400 MG tablet   Commonly known as: AVELOX   Take 1 tablet (400 mg total) by mouth daily.      multivitamin with minerals tablet   Take 1 tablet by mouth daily.      predniSONE 10 MG tablet   Commonly  known as: DELTASONE   Take 1 tablet (10 mg total) by mouth daily. Take 60 mg for 2 days, 50 mg for 2 days, 40 mg for 2 days, 30 mg for 2 days, 20 mg for 2 days, 10 mg for 2 days, 5 mg for 2 days and then discontinue.      pregabalin 200 MG capsule   Commonly known as: LYRICA   Take 200 mg by mouth 2 (two) times daily.      traZODone 100 MG tablet   Commonly known as: DESYREL   Take 100 mg by mouth at bedtime as needed. sleep      TUSSIN DM 10-100 MG/5ML liquid   Generic drug: Dextromethorphan-Guaifenesin   Take 10 mLs by mouth every 4 (four) hours.             Disposition and Follow-up:  Will be discharged home today. Needs followup with his PCP in 2 weeks  Consults:  None  Significant Diagnostic Studies:  Dg Neck Soft Tissue  04/11/2011  *RADIOLOGY REPORT*  Clinical Data: Choking.  NECK SOFT TISSUES - 1+ VIEW  Comparison: None.  Findings: The epiglottis is normal.  The aryepiglottic folds are normal.  There is air in the vallecular air space.  No prevertebral or retropharyngeal soft tissue swelling or abnormal air collections. No radiopaque foreign body.  IMPRESSION: Unremarkable soft tissue examination of the neck.  Original Report Authenticated By: P. Loralie Champagne, M.D.   Ct Angio Chest W/cm &/or Wo Cm  04/12/2011  *RADIOLOGY REPORT*  Clinical Data: Shortness of breath.  CT ANGIOGRAPHY CHEST  Technique:  Multidetector CT imaging of the chest using the standard protocol during bolus administration of intravenous contrast. Multiplanar reconstructed images including MIPs were obtained and reviewed to evaluate the vascular anatomy.  Contrast: OMNIPAQUE IOHEXOL 300 MG/ML IJ SOLN  Comparison: Chest CT 01/15/2009.  Findings: The chest wall is unremarkable and stable.  Small scattered supraclavicular and axillary lymph nodes are noted. Thyroid gland is grossly normal.  The bony thorax is intact.  No acute rib fractures.  The heart is normal in size and stable.  No pericardial  effusion. The aorta is normal in caliber.  No dissection.  The esophagus is grossly normal.  There are enlarged mediastinal and hilar lymph nodes which are stable and consistent with known sarcoidosis.  The pulmonary arterial tree is fairly well opacified.  No filling defects to suggest pulmonary emboli.  Examination of the lung parenchyma demonstrates chronic lung disease related to known sarcoidosis with severe scarring and diffuse nodularity.  The large cystic cavity in the right lung has enlarged since prior study.  Ground-glass opacity in the left upper lobe and left lower lobes could suggest acute overlying infection.  No pleural effusion or obvious pulmonary edema.  The upper abdomen is grossly normal.  IMPRESSION:  1.  No CT findings for pulmonary embolism. 2.  Normal thoracic aorta. 3.  Stable changes of sarcoidosis with mediastinal/hilar adenopathy and fibrotic lung disease. 4.  Probable superimposed acute left upper lobe and lower lobe infiltrates.  Original Report Authenticated By: P. Loralie Champagne, M.D.    Brief H and P: For complete details please refer to admission H and P, but in brief this is a 46 year old gentleman known history of sarcoidosis and obstructive sleep apnea on CPAP. He states for the past 3 days or so he's had increasing shortness of breath and wheezing. He states his symptoms have gotten better then returned intermittently. He denies any nausea or vomiting. He states no fever but he's had chills. He is a cough that is productive of yellow and green sputum. He finally felt bad enough he came to the ER. We were asked to admit him for further evaluation and management.     Hospital Course:  Principal Problem:  *CAP (community acquired pneumonia) Active Problems:  Sarcoid  OSA on CPAP  Tracheobronchitis   #1 CAP: Will be transitioned over to avelox to complete 7 days of treatment.  #2 Tracheobronchitis: Prednisone taper. Patient is still wheezing at time of DC. I  have advised against discharge today but he is adamant about going home. Says he has oxygen as well as a nebulizer machine and can take PO meds without issue. Have advised he return to the ED if his symptoms worsen despite treatment.  Time spent on Discharge: Greater than 30 minutes.  SignedChaya Jan [facility] Pager: [phone] 04/13/2011, 11:21 AM

## 2011-04-13 NOTE — Progress Notes (Signed)
   CARE MANAGEMENT NOTE 04/13/2011  Patient:  [name]   Account Number:  [id number]  Date Initiated:  04/12/2011  Documentation initiated by:  [name]  Subjective/Objective Assessment:   ADMITTED W/SOB.HX: SARCOIDOSIS,SLEEP APNEA.     Action/Plan:   FROM HOME ALONE,HOME 02   Anticipated DC Date:  04/13/2011   Anticipated DC Plan:  HOME/SELF CARE         Choice offered to / List presented to:             Status of service:  Completed, signed off Medicare Important Message given?   (If response is "NO", the following Medicare IM given date fields will be blank) Date Medicare IM given:   Date Additional Medicare IM given:    Discharge Disposition:  HOME/SELF CARE  Per UR Regulation:  Reviewed for med. necessity/level of care/duration of stay  If discussed at Long Length of Stay Meetings, dates discussed:    Comments:  04/13/11 [facility] RN,BSN NCM [phone] PROVIDED W/$4 [employer] C[employer]. 04/12/11 [name] Nyland RN,BSN NCM [phone]

## 2011-04-13 NOTE — Progress Notes (Signed)
Pt. Called out complaining  of SOB,  Respirations were labored at a rate of 20, O2 sat 98% on 2 Liters via [place].  Wheezes noted at the bedside, Resp. Therapist notified for PRN breathing treatment. Will continue to monitor pt.

## 2011-04-27 ENCOUNTER — Emergency Department (HOSPITAL_COMMUNITY)
Admission: EM | Admit: 2011-04-27 | Discharge: 2011-04-27 | Disposition: A | Discharge status: Home / Self Care | Payer: 59 | Attending: Emergency Medicine | Admitting: Emergency Medicine

## 2011-04-27 ENCOUNTER — Encounter (HOSPITAL_COMMUNITY): Payer: Self-pay | Admitting: Emergency Medicine

## 2011-04-27 ENCOUNTER — Emergency Department (HOSPITAL_COMMUNITY): Payer: 59

## 2011-04-27 DIAGNOSIS — I10 Essential (primary) hypertension: Secondary | ICD-10-CM | POA: Insufficient documentation

## 2011-04-27 DIAGNOSIS — G4733 Obstructive sleep apnea (adult) (pediatric): Secondary | ICD-10-CM | POA: Insufficient documentation

## 2011-04-27 DIAGNOSIS — J40 Bronchitis, not specified as acute or chronic: Secondary | ICD-10-CM | POA: Insufficient documentation

## 2011-04-27 DIAGNOSIS — R0602 Shortness of breath: Secondary | ICD-10-CM | POA: Insufficient documentation

## 2011-04-27 DIAGNOSIS — Z79899 Other long term (current) drug therapy: Secondary | ICD-10-CM | POA: Insufficient documentation

## 2011-04-27 DIAGNOSIS — J040 Acute laryngitis: Secondary | ICD-10-CM | POA: Insufficient documentation

## 2011-04-27 HISTORY — DX: Pneumonia, unspecified organism: J18.9

## 2011-04-27 HISTORY — DX: Bronchitis, not specified as acute or chronic: J40

## 2011-04-27 LAB — URINALYSIS, ROUTINE W REFLEX MICROSCOPIC
Bilirubin Urine: NEGATIVE
Ketones, ur: NEGATIVE mg/dL
Nitrite: NEGATIVE
Protein, ur: NEGATIVE mg/dL
Urobilinogen, UA: 0.2 mg/dL (ref 0.0–1.0)
pH: 6 (ref 5.0–8.0)

## 2011-04-27 LAB — BASIC METABOLIC PANEL
BUN: 12 mg/dL (ref 6–23)
Calcium: 9 mg/dL (ref 8.4–10.5)
Creatinine, Ser: 0.98 mg/dL (ref 0.50–1.35)
GFR calc Af Amer: >90 mL/min (ref >90)
GFR calc non Af Amer: >90 mL/min (ref >90)

## 2011-04-27 LAB — CBC
HCT: 38.1 % — ABNORMAL LOW (ref 39.0–52.0)
MCH: 27.2 pg (ref 26.0–34.0)
MCHC: 32.8 g/dL (ref 30.0–36.0)
MCV: 82.8 fL (ref 78.0–100.0)
Platelets: 373 10*3/uL (ref 150–400)
RDW: 13.8 % (ref 11.5–15.5)

## 2011-04-27 MED ORDER — ALBUTEROL SULFATE (5 MG/ML) 0.5% IN NEBU
5.0000 mg | INHALATION_SOLUTION | Freq: Once | RESPIRATORY_TRACT | Status: AC
  Administered 2011-04-27: 5 mg via RESPIRATORY_TRACT
  Filled 2011-04-27: qty 1

## 2011-04-27 MED ORDER — PREDNISONE 50 MG PO TABS: 50.0000 mg | ORAL_TABLET | Freq: Every day | ORAL | Status: DC

## 2011-04-27 MED ORDER — IPRATROPIUM BROMIDE 0.02 % IN SOLN
0.5000 mg | Freq: Once | RESPIRATORY_TRACT | Status: AC
Start: 2011-04-27 — End: 2011-04-27
  Administered 2011-04-27: 0.5 mg via RESPIRATORY_TRACT
  Filled 2011-04-27: qty 2.5

## 2011-04-27 MED ORDER — SODIUM CHLORIDE 0.9 % IV BOLUS (SEPSIS)
1000.0000 mL | Freq: Once | INTRAVENOUS | Status: AC
  Administered 2011-04-27: 1000 mL via INTRAVENOUS

## 2011-04-27 NOTE — ED Notes (Signed)
Pt ambulated around unit. O2 level upon ambulation was 93%

## 2011-04-27 NOTE — Discharge Instructions (Signed)
Please call your primary care doctor tomorrow morning to schedule a close follow up appointment within 1-2 days.  Take the prednisone as prescribed.  Return to the ER if you develop fevers greater than 100.4, difficulty swallowing or breathing, or inability to tolerate fluids by mouth.  Rest your voice and continue to use the mucinex and other prescribed medications as directed.  You may return to the ER at any time for worsening condition or any new symptoms that concern you.  Laryngitis At the top of your windpipe is your voice box. It is the source of your voice. Inside your voice box are 2 bands of muscles called vocal cords. When you breathe, your vocal cords are relaxed and open so that air can get into the lungs. When you decide to say something, these cords come together and vibrate. The sound from these vibrations goes into your throat and comes out through your mouth as sound. Laryngitis is an inflammation of the vocal cords that causes hoarseness, cough, loss of voice, sore throat, and dry throat. Laryngitis can be temporary (acute) or long-term (chronic). Most cases of acute laryngitis improve with time.Chronic laryngitis lasts for more than 3 weeks. CAUSES Laryngitis can often be related to excessive smoking, talking, or yelling, as well as inhalation of toxic fumes and allergies. Acute laryngitis is usually caused by a viral infection, vocal strain, measles or mumps, or bacterial infections. Chronic laryngitis is usually caused by vocal cord strain, vocal cord injury, postnasal drip, growths on the vocal cords, or acid reflux. SYMPTOMS   Cough.   Sore throat.   Dry throat.  RISK FACTORS  Respiratory infections.   Exposure to irritating substances, such as cigarette smoke, excessive amounts of alcohol, stomach acids, and workplace chemicals.   Voice trauma, such as vocal cord injury from shouting or speaking too loud.  DIAGNOSIS  Your cargiver will perform a physical exam. During  the physical exam, your caregiver will examine your throat. The most common sign of laryngitis is hoarseness. Laryngoscopy may be necessary to confirm the diagnosis of this condition. This procedure allows your caregiver to look into the larynx. HOME CARE INSTRUCTIONS  Drink enough fluids to keep your urine clear or pale yellow.   Rest until you no longer have symptoms or as directed by your caregiver.   Breathe in moist air.   Take all medicine as directed by your caregiver.   Do not smoke.   Talk as little as possible (this includes whispering).   Write on paper instead of talking until your voice is back to normal.   Follow up with your caregiver if your condition has not improved after 10 days.  SEEK MEDICAL CARE IF:   You have trouble breathing.   You cough up blood.   You have persistent fever.   You have increasing pain.   You have difficulty swallowing.  MAKE SURE YOU:  Understand these instructions.   Will watch your condition.   Will get help right away if you are not doing well or get worse.  Document Released: 12/28/2004 Document Revised: 12/17/2010 Document Reviewed: 03/05/2010 [facility] Patient Information 2012 [place], [place].  Bronchitis Bronchitis is a problem of the air tubes leading to your lungs. This problem makes it hard for air to get in and out of the lungs. You may cough a lot because your air tubes are narrow. Going without care can cause lasting (chronic) bronchitis. HOME CARE   Drink enough fluids to keep your pee (urine) clear  or pale yellow.   Use a cool mist humidifier.   Quit smoking if you smoke. If you keep smoking, the bronchitis might not get better.   Only take medicine as told by your doctor.  GET HELP RIGHT AWAY IF:   Coughing keeps you awake.   You start to wheeze.   You become more sick or weak.   You have a hard time breathing or get short of breath.   You cough up blood.   Coughing lasts more than 2 weeks.   You  have a fever.   Your baby is older than 3 months with a rectal temperature of 102 F (38.9 C) or higher.   Your baby is 77 months old or younger with a rectal temperature of 100.4 F (38 C) or higher.  MAKE SURE YOU:  Understand these instructions.   Will watch your condition.   Will get help right away if you are not doing well or get worse.  Document Released: 06/16/2007 Document Revised: 12/17/2010 Document Reviewed: 11/29/2008 [facility] Patient Information 2012 [place], [place].  Antibiotic Nonuse  Your caregiver felt that the infection or problem was not one that would be helped with an antibiotic. Infections may be caused by viruses or bacteria. Only a caregiver can tell which one of these is the likely cause of an illness. A cold is the most common cause of infection in both adults and children. A cold is a virus. Antibiotic treatment will have no effect on a viral infection. Viruses can lead to many lost days of work caring for sick children and many missed days of school. Children may catch as many as 10 "colds" or "flus" per year during which they can be tearful, cranky, and uncomfortable. The goal of treating a virus is aimed at keeping the ill person comfortable. Antibiotics are medications used to help the body fight bacterial infections. There are relatively few types of bacteria that cause infections but there are hundreds of viruses. While both viruses and bacteria cause infection they are very different types of germs. A viral infection will typically go away by itself within 7 to 10 days. Bacterial infections may spread or get worse without antibiotic treatment. Examples of bacterial infections are:  Sore throats (like strep throat or tonsillitis).   Infection in the lung (pneumonia).   Ear and skin infections.  Examples of viral infections are:  Colds or flus.   Most coughs and bronchitis.   Sore throats not caused by Strep.   Runny noses.  It is often best not to  take an antibiotic when a viral infection is the cause of the problem. Antibiotics can kill off the helpful bacteria that we have inside our body and allow harmful bacteria to start growing. Antibiotics can cause side effects such as allergies, nausea, and diarrhea without helping to improve the symptoms of the viral infection. Additionally, repeated uses of antibiotics can cause bacteria inside of our body to become resistant. That resistance can be passed onto harmful bacterial. The next time you have an infection it may be harder to treat if antibiotics are used when they are not needed. Not treating with antibiotics allows our own immune system to develop and take care of infections more efficiently. Also, antibiotics will work better for [place] when they are prescribed for bacterial infections. Treatments for a child that is ill may include:  Give extra fluids throughout the day to stay hydrated.   Get plenty of rest.   Only give  your child over-the-counter or prescription medicines for pain, discomfort, or fever as directed by your caregiver.   The use of a cool mist humidifier may help stuffy noses.   Cold medications if suggested by your caregiver.  Your caregiver may decide to start you on an antibiotic if:  The problem you were seen for today continues for a longer length of time than expected.   You develop a secondary bacterial infection.  SEEK MEDICAL CARE IF:  Fever lasts longer than 5 days.   Symptoms continue to get worse after 5 to 7 days or become severe.   Difficulty in breathing develops.   Signs of dehydration develop (poor drinking, rare urinating, dark colored urine).   Changes in behavior or worsening tiredness (listlessness or lethargy).  Document Released: 03/08/2001 Document Revised: 12/17/2010 Document Reviewed: 09/04/2008 [facility] Patient Information 2012 [place], [place].

## 2011-04-27 NOTE — ED Notes (Signed)
RT bedside to perform treatment 

## 2011-04-27 NOTE — ED Provider Notes (Signed)
History     CSN: [id number]  Arrival date & time 04/27/11  0130   First MD Initiated Contact with Patient 04/27/11 0236      Chief Complaint  Patient presents with  . Shortness of Breath    (Consider location/radiation/quality/duration/timing/severity/associated sxs/prior treatment) HPI Comments: Patient reports he was released from the hospital and since then has finished his antibiotics and prednisone, is using nebs at home, reports he continues to have sore throat and intermittently loses his voice and becomes hoarse.  Continues to have cough productive of yellow sputum and is having intermittent SOB but denies CP.  Denies fevers but notes he is having "cold sweats."  Is using neb treatments at home without relief.    Patient is a 46 y.o. male presenting with shortness of breath. The history is provided by the patient.  Shortness of Breath  Associated symptoms include sore throat and shortness of breath. Pertinent negatives include no fever.    Past Medical History  Diagnosis Date  . Hypertension   . Sarcoidosis   . OSA on CPAP   . Chronic respiratory failure     hom oxygen PRN  . Bronchitis   . Pneumonia     Past Surgical History  Procedure Date  . Rotator cuff repair   . Arm surgery      Family History  Problem Relation Age of Onset  . Diabetes Father     History  Substance Use Topics  . Smoking status: Never Smoker   . Smokeless tobacco: Never Used  . Alcohol Use: Yes     social      Review of Systems  Constitutional: Positive for diaphoresis. Negative for fever, chills and appetite change.  HENT: Positive for sore throat. Negative for trouble swallowing.   Respiratory: Positive for shortness of breath.   Genitourinary: Positive for frequency.  All other systems reviewed and are negative.    Allergies  Clindamycin/lincomycin  Home Medications   Current Outpatient Rx  Name Route Sig Dispense Refill  . ALBUTEROL SULFATE HFA 108 (90 BASE)  MCG/ACT IN AERS Inhalation Inhale 2 puffs into the lungs every 6 (six) hours as needed for wheezing or shortness of breath. 1 Inhaler 1  . ALBUTEROL SULFATE (2.5 MG/3ML) 0.083% IN NEBU Nebulization Take 2.5 mg by nebulization every 6 (six) hours as needed.      Marland Kitchen AMITRIPTYLINE HCL 25 MG PO TABS Oral Take 25 mg by mouth at bedtime as needed. For sleep    . CALCIUM + D PO Oral Take 1 tablet by mouth daily.    . COD LIVER OIL PO CAPS Oral Take 1 capsule by mouth daily.    . CYCLOBENZAPRINE HCL 10 MG PO TABS Oral Take 10 mg by mouth 3 (three) times daily as needed. Muscle spasms.    . DEXTROMETHORPHAN-GUAIFENESIN 10-100 MG/5ML PO SYRP Oral Take 10 mLs by mouth every 4 (four) hours.    Marland Kitchen DIPHENHYDRAMINE HCL (SLEEP) 25 MG PO TABS Oral Take 25 mg by mouth as needed. For itching    . GUAIFENESIN ER 600 MG PO TB12 Oral Take 1,200 mg by mouth 2 (two) times daily.    Marland Kitchen LIDOCAINE 5 % EX PTCH Transdermal Place 1 patch onto the skin daily. Remove & Discard patch within 12 hours or as directed by MD    . MULTI-VITAMIN/MINERALS PO TABS Oral Take 1 tablet by mouth daily.    Marland Kitchen PREGABALIN 200 MG PO CAPS Oral Take 200 mg by mouth 2 (two)  times daily.    . TRAZODONE HCL 100 MG PO TABS Oral Take 100 mg by mouth at bedtime as needed. sleep    . PREDNISONE 10 MG PO TABS Oral Take 1 tablet (10 mg total) by mouth daily. Take 60 mg for 2 days, 50 mg for 2 days, 40 mg for 2 days, 30 mg for 2 days, 20 mg for 2 days, 10 mg for 2 days, 5 mg for 2 days and then discontinue. 1 tablet 0    BP 136/86  Pulse 110  Temp 98.4 F (36.9 C)  Resp 20  SpO2 97%  Physical Exam  Nursing note and vitals reviewed. Constitutional: He is oriented to person, place, and time. He appears well-developed and well-nourished.  HENT:  Head: Normocephalic and atraumatic.  Mouth/Throat: Uvula is midline, oropharynx is clear and moist and mucous membranes are normal. Mucous membranes are not dry. No oropharyngeal exudate, posterior oropharyngeal  edema, posterior oropharyngeal erythema or tonsillar abscesses.  Neck: Neck supple.  Cardiovascular: Normal rate, regular rhythm and normal heart sounds.   Pulmonary/Chest: Breath sounds normal. No respiratory distress. He has no wheezes. He has no rales. He exhibits no tenderness.  Abdominal: Soft. Bowel sounds are normal. There is no tenderness.  Musculoskeletal:       Bilateral ankles tender to palpation diffusely.  Calves nontender, no edema.  Distal pulses intact and equal bilaterally.    Neurological: He is alert and oriented to person, place, and time.    ED Course  Procedures (including critical care time)  Labs Reviewed  CBC - Abnormal; Notable for the following:    Hemoglobin 12.5 (*)    HCT 38.1 (*)    All other components within normal limits  BASIC METABOLIC PANEL  URINALYSIS, ROUTINE W REFLEX MICROSCOPIC   Dg Chest Portable 1 View  04/27/2011  *RADIOLOGY REPORT*  Clinical Data: Shortness of breath and sore throat.  PORTABLE CHEST - 1 VIEW  Comparison: Chest radiograph performed 01/15/2011, and CTA of the chest performed 04/12/2011  Findings: The lungs are well-aerated.  There has been no significant interval change in the appearance of the bilateral chronic fibrosis, airspace opacification and nodularity, and underlying bullous change most prominent at the right upper lobe. Previously noted superimposed airspace disease at the left lung appears to have resolved.  There is no evidence of pleural effusion or pneumothorax.  The cardiomediastinal silhouette is within normal limits.  No acute osseous abnormalities are seen.  IMPRESSION:  1.  Previously noted superimposed airspace disease at the left lung appears to have resolved. 2.  Stable appearance to bilateral chronic fibrosis, airspace opacification and nodularity, and underlying bullous change.  This reflects the patient's sarcoidosis.  Original Report Authenticated By: Tonia Ghent, M.D.   5:19 AM Discussed patient with Dr  Dierdre Highman.  Plan is for d/c home with prednisone and PCP follow up within 48 hours.    1. Laryngitis   2. Bronchitis       MDM  Patient with recent admission for tracheobronchitis and pneumonia returns to ED because he feels he is not improving and back to normal, also concerned that he is now hoarse.  Patient is well appearing, afebrile, nontoxic.  Lungs CTAB, CXR negative and shows resolved infection.  Pt likely with continued tracheobronchitis.  Pt has finished his Moxifloxacin.  Plan is for d/c home with additional prescription for prednisone, PCP follow up. Patient verbalizes understanding and agrees with plan.          Dillard Cannon  [place], [place] 04/27/11 [phone]

## 2011-04-27 NOTE — ED Provider Notes (Signed)
Medical screening examination/treatment/procedure(s) were performed by non-physician practitioner and as supervising physician I was immediately available for consultation/collaboration.  Kyliee Ortego, MD 04/27/11 0725 

## 2011-04-27 NOTE — ED Notes (Signed)
Pt states he was admitted about a week and a half ago for pneumonia  Pt states he has hx of sarcoidosis  Pt is hoarse when he talks  Pt states he has not felt "right" since he was discharged  Pt states he gets very short of breath with exertion

## 2011-06-15 ENCOUNTER — Emergency Department (HOSPITAL_COMMUNITY)
Admission: EM | Admit: 2011-06-15 | Discharge: 2011-06-15 | Disposition: A | Discharge status: Home / Self Care | Payer: 59 | Attending: Emergency Medicine | Admitting: Emergency Medicine

## 2011-06-15 ENCOUNTER — Emergency Department (HOSPITAL_COMMUNITY): Payer: 59

## 2011-06-15 ENCOUNTER — Encounter (HOSPITAL_COMMUNITY): Payer: Self-pay | Admitting: *Deleted

## 2011-06-15 DIAGNOSIS — Z79899 Other long term (current) drug therapy: Secondary | ICD-10-CM | POA: Insufficient documentation

## 2011-06-15 DIAGNOSIS — R0602 Shortness of breath: Secondary | ICD-10-CM | POA: Insufficient documentation

## 2011-06-15 DIAGNOSIS — D869 Sarcoidosis, unspecified: Secondary | ICD-10-CM | POA: Insufficient documentation

## 2011-06-15 DIAGNOSIS — R0600 Dyspnea, unspecified: Secondary | ICD-10-CM

## 2011-06-15 DIAGNOSIS — J9610 Chronic respiratory failure, unspecified whether with hypoxia or hypercapnia: Secondary | ICD-10-CM | POA: Insufficient documentation

## 2011-06-15 DIAGNOSIS — I10 Essential (primary) hypertension: Secondary | ICD-10-CM | POA: Insufficient documentation

## 2011-06-15 DIAGNOSIS — G4733 Obstructive sleep apnea (adult) (pediatric): Secondary | ICD-10-CM | POA: Insufficient documentation

## 2011-06-15 MED ORDER — ALBUTEROL SULFATE (5 MG/ML) 0.5% IN NEBU
5.0000 mg | INHALATION_SOLUTION | Freq: Once | RESPIRATORY_TRACT | Status: AC
  Administered 2011-06-15: 5 mg via RESPIRATORY_TRACT
  Filled 2011-06-15: qty 1

## 2011-06-15 MED ORDER — ALBUTEROL SULFATE (2.5 MG/3ML) 0.083% IN NEBU: 2.5000 mg | INHALATION_SOLUTION | Freq: Four times a day (QID) | RESPIRATORY_TRACT | Status: DC | PRN

## 2011-06-15 MED ORDER — PREDNISONE 20 MG PO TABS
60.0000 mg | ORAL_TABLET | Freq: Once | ORAL | Status: AC
  Administered 2011-06-15: 60 mg via ORAL
  Filled 2011-06-15: qty 3

## 2011-06-15 MED ORDER — PREDNISONE 20 MG PO TABS: 60.0000 mg | ORAL_TABLET | Freq: Once | ORAL | Status: AC

## 2011-06-15 NOTE — ED Notes (Signed)
Pt also reports episodes of getting hot and flushed, diaphoretic then in a few hours, very cold. Sts this has been going on a few years.

## 2011-06-15 NOTE — ED Notes (Signed)
Pt reports sob for a few days, worse today. Has tried breathing tx without relief. Called PCP, advised to come to ED. Hx sarcoidosis, O2 2-3L at home.

## 2011-06-15 NOTE — Discharge Instructions (Signed)
It is very important that you follow-up with your pulmonologist.  Please call tomorrow to arrange an appropriate visit.  Return to the ED for any concerning changes in your condition.

## 2011-06-15 NOTE — ED Notes (Signed)
Albuterol given per protocol order for wheezing.

## 2011-06-15 NOTE — ED Provider Notes (Signed)
History     CSN: [id number]  Arrival date & time 06/15/11  1258   First MD Initiated Contact with Patient 06/15/11 1359      Chief Complaint  Patient presents with  . Shortness of Breath  . Sarcoidosis     HPI The patient presents with one week of cough, congestion, dyspnea.  Notably, the patient has a history of sarcoidosis, and uses oxygen at home.  He states that his symptoms began gradually, since onset symptoms have been persistent.  He notes minimal improvement with home albuterol treatments, though he now has no more refills.  He denies any concurrent fevers, chills, confusion, vomiting, diarrhea, significant chest pain. Past Medical History  Diagnosis Date  . Hypertension   . Sarcoidosis   . OSA on CPAP   . Chronic respiratory failure     hom oxygen PRN  . Bronchitis   . Pneumonia     Past Surgical History  Procedure Date  . Rotator cuff repair   . Arm surgery      Family History  Problem Relation Age of Onset  . Diabetes Father     History  Substance Use Topics  . Smoking status: Never Smoker   . Smokeless tobacco: Never Used  . Alcohol Use: Yes     social      Review of Systems  All other systems reviewed and are negative.    Allergies  Clindamycin/lincomycin  Home Medications   Current Outpatient Rx  Name Route Sig Dispense Refill  . ALBUTEROL SULFATE HFA 108 (90 BASE) MCG/ACT IN AERS Inhalation Inhale 2 puffs into the lungs every 6 (six) hours as needed for wheezing or shortness of breath. 1 Inhaler 1  . ALBUTEROL SULFATE (2.5 MG/3ML) 0.083% IN NEBU Nebulization Take 2.5 mg by nebulization every 6 (six) hours as needed. For shortness of breath    . AMITRIPTYLINE HCL 25 MG PO TABS Oral Take 25 mg by mouth at bedtime as needed. For sleep    . CALCIUM + D PO Oral Take 1 tablet by mouth daily.    . COD LIVER OIL PO CAPS Oral Take 1 capsule by mouth daily.    . CYCLOBENZAPRINE HCL 10 MG PO TABS Oral Take 10 mg by mouth 3 (three) times daily as  needed. Muscle spasms.    . DEXTROMETHORPHAN-GUAIFENESIN 10-100 MG/5ML PO SYRP Oral Take 10 mLs by mouth every 4 (four) hours. For cough    . DIPHENHYDRAMINE HCL (SLEEP) 25 MG PO TABS Oral Take 25 mg by mouth as needed. For itching    . GUAIFENESIN ER 600 MG PO TB12 Oral Take 1,200 mg by mouth 2 (two) times daily.    Marland Kitchen LIDOCAINE 5 % EX PTCH Transdermal Place 1 patch onto the skin daily. Remove & Discard patch within 12 hours or as directed by MD    . MULTI-VITAMIN/MINERALS PO TABS Oral Take 1 tablet by mouth daily.    Marland Kitchen PREGABALIN 200 MG PO CAPS Oral Take 200 mg by mouth 2 (two) times daily.    . TRAZODONE HCL 100 MG PO TABS Oral Take 100 mg by mouth at bedtime as needed. sleep      BP 139/94  Pulse 116  Temp(Src) 98.3 F (36.8 C) (Oral)  SpO2 100%  Physical Exam  Nursing note and vitals reviewed. Constitutional: He is oriented to person, place, and time. He appears well-developed. No distress.  HENT:  Head: Normocephalic and atraumatic.  Eyes: Conjunctivae and EOM are normal.  Cardiovascular:  Normal rate and regular rhythm.   Pulmonary/Chest: Effort normal. No stridor. No respiratory distress. He has no decreased breath sounds. He has wheezes.       The patient has received one albuterol treatment prior to my auscultation  Abdominal: He exhibits no distension.  Musculoskeletal: He exhibits no edema.  Neurological: He is alert and oriented to person, place, and time.  Skin: Skin is warm and dry.  Psychiatric: He has a normal mood and affect.    ED Course  Procedures (including critical care time)  Labs Reviewed - No data to display No results found.   No diagnosis found.    MDM  This male, with history of sarcoidosis now presents with ongoing cough and congestion.  On exam he is in no distress, and his wheezing decreased substantially with albuterol inhalers.  The patient's x-ray does not demonstrate acute opacification, and given the absence of fever there is low  suspicion of pneumonia.  The patient was discharged with steroids, albuterol refills, and explicit instructions to follow up with his pulmonologist as soon as possible.      Gerhard Munch, MD 06/15/11 [phone]

## 2011-06-26 ENCOUNTER — Emergency Department (HOSPITAL_COMMUNITY)
Admission: EM | Admit: 2011-06-26 | Discharge: 2011-06-26 | Disposition: A | Discharge status: Home / Self Care | Payer: 59 | Attending: Emergency Medicine | Admitting: Emergency Medicine

## 2011-06-26 ENCOUNTER — Encounter (HOSPITAL_COMMUNITY): Payer: Self-pay | Admitting: *Deleted

## 2011-06-26 DIAGNOSIS — W260XXA Contact with knife, initial encounter: Secondary | ICD-10-CM | POA: Insufficient documentation

## 2011-06-26 DIAGNOSIS — I10 Essential (primary) hypertension: Secondary | ICD-10-CM | POA: Insufficient documentation

## 2011-06-26 DIAGNOSIS — R319 Hematuria, unspecified: Secondary | ICD-10-CM

## 2011-06-26 DIAGNOSIS — S61019A Laceration without foreign body of unspecified thumb without damage to nail, initial encounter: Secondary | ICD-10-CM

## 2011-06-26 DIAGNOSIS — Z23 Encounter for immunization: Secondary | ICD-10-CM | POA: Insufficient documentation

## 2011-06-26 DIAGNOSIS — G4733 Obstructive sleep apnea (adult) (pediatric): Secondary | ICD-10-CM | POA: Insufficient documentation

## 2011-06-26 DIAGNOSIS — D869 Sarcoidosis, unspecified: Secondary | ICD-10-CM

## 2011-06-26 DIAGNOSIS — S61209A Unspecified open wound of unspecified finger without damage to nail, initial encounter: Secondary | ICD-10-CM | POA: Insufficient documentation

## 2011-06-26 LAB — URINALYSIS, ROUTINE W REFLEX MICROSCOPIC
Bilirubin Urine: NEGATIVE
Ketones, ur: NEGATIVE mg/dL
Specific Gravity, Urine: 1.018 (ref 1.005–1.030)
Urobilinogen, UA: 0.2 mg/dL (ref 0.0–1.0)

## 2011-06-26 MED ORDER — ALBUTEROL SULFATE (5 MG/ML) 0.5% IN NEBU
5.0000 mg | INHALATION_SOLUTION | Freq: Once | RESPIRATORY_TRACT | Status: AC
  Administered 2011-06-26: 5 mg via RESPIRATORY_TRACT
  Filled 2011-06-26: qty 1

## 2011-06-26 MED ORDER — PREDNISONE 20 MG PO TABS
60.0000 mg | ORAL_TABLET | Freq: Once | ORAL | Status: AC
  Administered 2011-06-26: 60 mg via ORAL
  Filled 2011-06-26: qty 3

## 2011-06-26 MED ORDER — PREDNISONE 20 MG PO TABS: 60.0000 mg | ORAL_TABLET | Freq: Every day | ORAL | Status: AC

## 2011-06-26 MED ORDER — TETANUS-DIPHTH-ACELL PERTUSSIS 5-2.5-18.5 LF-MCG/0.5 IM SUSP
0.5000 mL | Freq: Once | INTRAMUSCULAR | Status: AC
  Administered 2011-06-26: 0.5 mL via INTRAMUSCULAR
  Filled 2011-06-26: qty 0.5

## 2011-06-26 MED ORDER — IPRATROPIUM BROMIDE 0.02 % IN SOLN
0.5000 mg | Freq: Once | RESPIRATORY_TRACT | Status: AC
  Administered 2011-06-26: 0.5 mg via RESPIRATORY_TRACT
  Filled 2011-06-26: qty 2.5

## 2011-06-26 NOTE — Discharge Instructions (Signed)
You were seen and evaluated for your complaints of some laceration. This was cleaned and closed with a topical glue. Continue to keep your finger bandaged and straight to help with healing. You are also seen for concerns of blood in your urine. Please followup with the urology specialist for continued evaluation of your symptoms. You're also treated for your sarcoidosis symptoms. You're given a breathing treatment and prescriptions for steroids. Please take these as prescribed followup with your primary care provider. Return for any worsening symptoms.   Sarcoidosis, Schaumann's Disease, Sarcoid of [place] Sarcoidosis appears briefly and heals naturally in 60 to 70 percent of cases, often without the patient knowing or doing anything about it. 20 to 30 percent of patients with sarcoidosis are left with some permanent lung damage. In 10 to 15 percent of the patients, sarcoidosis can become chronic (long lasting). When either the granulomas or fibrosis seriously affect the function of a vital organ (lungs, heart, nervous system, liver, or kidneys), sarcoidosis can be fatal. This occurs 5 to 10 percent of the time. No one can predict how sarcoidosis will progress in an individual patient. The symptoms the patient experiences, the caregiver's findings, and the patient's race can give some clues. Sarcoidosis was once considered a rare disease. We now know that it is a common chronic illness that appears all over the world. It is the most common of the fibrotic (scarring) lung disorders. Anyone can get sarcoidosis. It occurs in all races and in both sexes. The risk is greater if you are a young black adult, especially a black woman, or are of [place], [place], [place], or [place] origin. In sarcoidosis, small lumps (also called nodules or granulomas) develop in multiple organs of the body. These granulomas are small collections of inflamed cells. They commonly appear in the lungs. This is the most common organ  affected. They also occur in the lymph nodes (your glands), skin, liver, and eyes. The granulomas vary in the amount of disease they produce from very little with no problems (symptoms) to causing severe illness. The cause of sarcoidosis is not known. It may be due to an abnormal immune reaction in the body. Most people will recover. A few people will develop long lasting conditions that may get worse. Women are affected more often than men. The majority of those affected are under forty years of age. Because we do not know the cause, we do not have ways to prevent it. SYMPTOMS   Fever.   Loss of appetite.   Night sweats.   Joint pain.   Aching muscles  Symptoms vary because the disease affects different parts of the body in different people. Most people who see their caregiver with sarcoidosis have lung problems. The first signs are usually a dry cough and shortness of breath. There may also be wheezing, chest pain, or a cough that brings up bloody mucus. In severe cases, lung function may become so poor that the person cannot perform even the simple routine tasks of daily life. Other symptoms of sarcoidosis are less common than lung symptoms. They can include:  Skin symptoms. Sarcoidosis can appear as a collection of tender, red bumps called erythema nodosum. These bumps usually occur on the face, shins, and arms. They can also occur as a scaly, purplish discoloration on the nose, cheeks, and ears. This is called lupus pernio. Less often, sarcoidosis causes cysts, pimples, or disfiguring over growths of skin. In many cases, the disfiguring over growths develop in areas of scars  or tattoos.   Eye symptoms. These include redness, eye pain, and sensitivity to light.   Heart symptoms. These include irregular heartbeat and heart failure.   Other symptoms. A person may have paralyzed facial muscles, seizures, psychiatric symptoms, swollen salivary glands, or bone pain.  DIAGNOSIS  Even when there  are no symptoms, your caregiver can sometimes pick up signs of sarcoidosis during a routine examination, usually through a chest x-ray or when checking other complaints. The patient's age and race or ethnic group can raise an additional red flag that a sign or symptom could be related to sarcoidosis.   Enlargement of the salivary or tear glands and cysts in bone tissue may also be caused by sarcoidosis.   You may have had a biopsy done that shows signs of sarcoidosis. A biopsy is a small tissue sample that is removed for laboratory testing. This tissue sample can be taken from your lung, skin, lip, or another inflamed or abnormal area of the body.   You may have had an abnormal chest X-ray. Although you appear healthy, a chest X-ray ordered for other reasons may turn up abnormalities that suggest sarcoidosis.   Other tests may be needed. These tests may be done to rule out other illnesses or to determine the amount of organ damage caused by sarcoidosis. Some of the most common tests are:   Blood levels of calcium or angiotensin-converting enzyme may be high in people with sarcoidosis.   Blood tests to evaluate how well your liver is functioning.   Lung function tests to measure how well you are breathing.   A complete eye examination.  TREATMENT  If sarcoidosis does not cause any problems, treatment may not be necessary. Your caregiver may decide to simply monitor your condition. As part of this monitoring process, you may have frequent office visits, follow-up chest X-rays, and tests of your lung function.If you have signs of moderate or severe lung disease, your doctor may recommend:  A corticosteroid drug, such as prednisone (sold under several brand names).   Corticosteroids also are used to treat sarcoidosis of the eyes, joints, skin, nerves, or heart.   Corticosteroid eye drops may be used for the eyes.   Over-the-counter medications like nonsteroidal anti-inflammatory drugs (NSAID)  often are used to treat joint pain first before corticosteroids, which tend to have more side effects.   If corticosteroids are not effective or cause serious side effects, other drugs that alter or suppress the immune system may be used.   In rare cases, when sarcoidosis causes life-threatening lung disease, a lung transplant may be necessary. However, there is some risk that the new lungs also will be attacked by sarcoidosis.  SEEK IMMEDIATE MEDICAL CARE IF:   You suffer from shortness of breath or a lingering cough.   You develop new problems that may be related to the disease. Remember this disease can affect almost all organs of the body and cause many different problems.  Document Released: 10/29/2003 Document Revised: 12/17/2010 Document Reviewed: 04/07/2005 [facility] Patient Information 2012 [place], [place].     Stitches, Staples, or Skin Adhesive Strips  Stitches (sutures), staples, and skin adhesive strips hold the skin together as it heals. They will usually be in place for 7 days or less. HOME CARE  Wash your hands with soap and water before and after you touch your wound.   Only take medicine as told by your doctor.   Cover your wound only if your doctor told you to. Otherwise, leave  it open to air.   Do not get your stitches wet or dirty. If they get dirty, dab them gently with a clean washcloth. Wet the washcloth with soapy water. Do not rub. Pat them dry gently.   Do not put medicine or medicated cream on your stitches unless your doctor told you to.   Do not take out your own stitches or staples. Skin adhesive strips will fall off by themselves.   Do not pick at the wound. Picking can cause an infection.   Do not miss your follow-up appointment.   If you have problems or questions, call your doctor.  GET HELP RIGHT AWAY IF:   You have a temperature by mouth above 102 F (38.9 C), not controlled by medicine.   You have chills.   You have redness or pain  around your stitches.   There is puffiness (swelling) around your stitches.   You notice fluid (drainage) from your stitches.   There is a bad smell coming from your wound.  MAKE SURE YOU:  Understand these instructions.   Will watch your condition.   Will get help if you are not doing well or get worse.  Document Released: 10/25/2008 Document Revised: 12/17/2010 Document Reviewed: 10/25/2008 [facility] Patient Information 2012 [place], [place].

## 2011-06-26 NOTE — ED Provider Notes (Signed)
History     CSN: [id number]  Arrival date & time 06/26/11  0138   First MD Initiated Contact with Patient 06/26/11 0316      Chief Complaint  Patient presents with  . Shortness of Breath  . Hematuria  . Extremity Laceration    "left thumb"   HPI  History provided by the patient. Patient is a 46 year old male with history of hypertension, sarcoidosis who presents with complaints of laceration to left thumb in symptoms of hematuria. Patient states that he had a small laceration to his thumb this evening around 9 or 10 PM from a butcher knife as he was cutting a cob of corn. Patient also states that he noticed one episode of blood in his urine earlier today. Patient states he had a history of similar episode a few years back. He was evaluated by urology at that time without any concerning findings. Patient has since urinated without bleeding in urine. Patient also mentions having slight increase in sarcoidosis wheezing symptoms. Patient states that he feels like he could use a small prescription of steroid. Patient has been using his home inhaler medications regularly. He denies any severe complaints of shortness of breath. He denies any fever, chills, sweats. Patient is unsure of his last tetanus shot.    Past Medical History  Diagnosis Date  . Hypertension   . Sarcoidosis   . OSA on CPAP   . Chronic respiratory failure     hom oxygen PRN  . Bronchitis   . Pneumonia     Past Surgical History  Procedure Date  . Rotator cuff repair   . Arm surgery      Family History  Problem Relation Age of Onset  . Diabetes Father     History  Substance Use Topics  . Smoking status: Never Smoker   . Smokeless tobacco: Never Used  . Alcohol Use: Yes     social      Review of Systems  Constitutional: Negative for fever and chills.  Respiratory: Positive for wheezing. Negative for cough.   Cardiovascular: Negative for chest pain.  Gastrointestinal: Negative for nausea, vomiting,  abdominal pain, diarrhea and constipation.  Genitourinary: Positive for hematuria. Negative for dysuria, frequency, flank pain and discharge.    Allergies  Clindamycin/lincomycin  Home Medications   Current Outpatient Rx  Name Route Sig Dispense Refill  . ALBUTEROL SULFATE HFA 108 (90 BASE) MCG/ACT IN AERS Inhalation Inhale 2 puffs into the lungs every 6 (six) hours as needed for wheezing or shortness of breath. 1 Inhaler 1  . ALBUTEROL SULFATE (2.5 MG/3ML) 0.083% IN NEBU Nebulization Take 3 mLs (2.5 mg total) by nebulization every 6 (six) hours as needed. For shortness of breath 75 mL 0  . AMITRIPTYLINE HCL 25 MG PO TABS Oral Take 25 mg by mouth at bedtime as needed. For sleep    . CALCIUM + D PO Oral Take 1 tablet by mouth daily.    . COD LIVER OIL PO CAPS Oral Take 1 capsule by mouth daily.    . CYCLOBENZAPRINE HCL 10 MG PO TABS Oral Take 10 mg by mouth 3 (three) times daily as needed. Muscle spasms.    . DEXTROMETHORPHAN-GUAIFENESIN 10-100 MG/5ML PO SYRP Oral Take 10 mLs by mouth every 4 (four) hours. For cough    . DIPHENHYDRAMINE HCL (SLEEP) 25 MG PO TABS Oral Take 25 mg by mouth as needed. For itching    . GUAIFENESIN ER 600 MG PO TB12 Oral Take 1,200 mg  by mouth 2 (two) times daily.    Marland Kitchen LIDOCAINE 5 % EX PTCH Transdermal Place 1 patch onto the skin daily. Remove & Discard patch within 12 hours or as directed by MD    . MULTI-VITAMIN/MINERALS PO TABS Oral Take 1 tablet by mouth daily.    Marland Kitchen PREGABALIN 200 MG PO CAPS Oral Take 200 mg by mouth 2 (two) times daily.    . TRAZODONE HCL 100 MG PO TABS Oral Take 100 mg by mouth at bedtime as needed. sleep    . VARDENAFIL HCL 10 MG PO TABS Oral Take 10 mg by mouth as needed.      BP 162/117  Pulse 110  Temp 98.6 F (37 C) (Oral)  Resp 24  SpO2 96%  Physical Exam  Nursing note and vitals reviewed. Constitutional: He is oriented to person, place, and time. He appears well-developed and well-nourished. No distress.  HENT:  Head:  Normocephalic.  Cardiovascular: Regular rhythm.  Tachycardia present.   Pulmonary/Chest: Effort normal. No respiratory distress. He has wheezes. He has no rales.  Neurological: He is alert and oriented to person, place, and time.  Skin: Skin is warm.       1.5 cm superficial laceration to the dorsal left thumb. Full range of motion. No deep structure or tendon involvement. Normal medial and lateral sensations and cap refill.  Psychiatric: He has a normal mood and affect. His behavior is normal.    ED Course  Procedures   LACERATION REPAIR Performed by: Angus Seller Authorized by: Angus Seller Consent: Verbal consent obtained. Risks and benefits: risks, benefits and alternatives were discussed Consent given by: patient Patient identity confirmed: provided demographic data Prepped and Draped in normal sterile fashion Wound explored  Laceration Location: Left thumb  Laceration Length: 1.5 cm  No Foreign Bodies seen or palpated  Anesthesia: None   Irrigation method: syringe Amount of cleaning: standard  Skin closure: Skin with Dermabond   Patient tolerance: Patient tolerated the procedure well with no immediate complications.    Results for orders placed during the hospital encounter of 06/26/11  URINALYSIS, ROUTINE W REFLEX MICROSCOPIC      Component Value Range   Color, Urine YELLOW  YELLOW   APPearance CLEAR  CLEAR   Specific Gravity, Urine 1.018  1.005 - 1.030   pH 7.0  5.0 - 8.0   Glucose, UA NEGATIVE  NEGATIVE mg/dL   Hgb urine dipstick TRACE (*) NEGATIVE   Bilirubin Urine NEGATIVE  NEGATIVE   Ketones, ur NEGATIVE  NEGATIVE mg/dL   Protein, ur NEGATIVE  NEGATIVE mg/dL   Urobilinogen, UA 0.2  0.0 - 1.0 mg/dL   Nitrite NEGATIVE  NEGATIVE   Leukocytes, UA NEGATIVE  NEGATIVE  URINE MICROSCOPIC-ADD ON      Component Value Range   RBC / HPF 3-6  <3 RBC/hpf     1. Laceration of thumb   2. Hematuria   3. Sarcoid       MDM  Patient seen and evaluated.  Patient no acute distress.  Tetanus given. Patient with slight wheezing. Breathing treatment given. Patient has improvement of symptoms after treatment. Urine with only small amount of hematuria. Patient without flank pain or other concerning symptoms. We'll provide urology followup.  Angus Seller, [place] 06/26/11 [phone]

## 2011-06-26 NOTE — ED Notes (Signed)
Pt has multiple complaints, states he is sob,  "has sarcodosis, on 2L O2 prn and using presently"  Also laceration left thumb "with Rogelia Boga knife':

## 2011-06-26 NOTE — ED Provider Notes (Signed)
Medical screening examination/treatment/procedure(s) were performed by non-physician practitioner and as supervising physician I was immediately available for consultation/collaboration.  Olivia Mackie, MD 06/26/11 2138

## 2011-07-27 ENCOUNTER — Emergency Department (HOSPITAL_COMMUNITY): Payer: 59

## 2011-07-27 ENCOUNTER — Encounter (HOSPITAL_COMMUNITY): Payer: Self-pay | Admitting: *Deleted

## 2011-07-27 ENCOUNTER — Inpatient Hospital Stay (HOSPITAL_COMMUNITY)
Admission: EM | Admit: 2011-07-27 | Discharge: 2011-07-29 | DRG: 192 | Disposition: A | Discharge status: Home / Self Care | Payer: 59 | Attending: Internal Medicine | Admitting: Internal Medicine

## 2011-07-27 DIAGNOSIS — J209 Acute bronchitis, unspecified: Secondary | ICD-10-CM

## 2011-07-27 DIAGNOSIS — D869 Sarcoidosis, unspecified: Secondary | ICD-10-CM

## 2011-07-27 DIAGNOSIS — Z9989 Dependence on other enabling machines and devices: Secondary | ICD-10-CM | POA: Diagnosis present

## 2011-07-27 DIAGNOSIS — R7309 Other abnormal glucose: Secondary | ICD-10-CM | POA: Diagnosis not present

## 2011-07-27 DIAGNOSIS — J441 Chronic obstructive pulmonary disease with (acute) exacerbation: Principal | ICD-10-CM

## 2011-07-27 DIAGNOSIS — J40 Bronchitis, not specified as acute or chronic: Secondary | ICD-10-CM

## 2011-07-27 DIAGNOSIS — G4733 Obstructive sleep apnea (adult) (pediatric): Secondary | ICD-10-CM

## 2011-07-27 DIAGNOSIS — J189 Pneumonia, unspecified organism: Secondary | ICD-10-CM

## 2011-07-27 DIAGNOSIS — R739 Hyperglycemia, unspecified: Secondary | ICD-10-CM | POA: Diagnosis present

## 2011-07-27 DIAGNOSIS — R0902 Hypoxemia: Secondary | ICD-10-CM | POA: Diagnosis present

## 2011-07-27 LAB — BASIC METABOLIC PANEL
Calcium: 9.9 mg/dL (ref 8.4–10.5)
GFR calc Af Amer: >90 mL/min (ref >90)
GFR calc non Af Amer: >90 mL/min (ref >90)
Glucose, Bld: 177 mg/dL — ABNORMAL HIGH (ref 70–99)
Sodium: 136 mEq/L (ref 135–145)

## 2011-07-27 LAB — CBC
MCH: 26.6 pg (ref 26.0–34.0)
MCHC: 33.4 g/dL (ref 30.0–36.0)
Platelets: 371 10*3/uL (ref 150–400)
RBC: 5.23 MIL/uL (ref 4.22–5.81)

## 2011-07-27 LAB — BLOOD GAS, ARTERIAL
Acid-Base Excess: 2.2 mmol/L — ABNORMAL HIGH (ref 0.0–2.0)
Bicarbonate: 26.5 mEq/L — ABNORMAL HIGH (ref 20.0–24.0)
FIO2: 0.6 %
O2 Saturation: 99.4 %
PEEP: 6 cmH2O
pO2, Arterial: 264 mmHg — ABNORMAL HIGH (ref 80.0–100.0)

## 2011-07-27 MED ORDER — ENOXAPARIN SODIUM 40 MG/0.4ML ~~LOC~~ SOLN
40.0000 mg | Freq: Every day | SUBCUTANEOUS | Status: DC
  Administered 2011-07-28 (×2): 40 mg via SUBCUTANEOUS
  Filled 2011-07-27 (×3): qty 0.4

## 2011-07-27 MED ORDER — ONDANSETRON HCL 4 MG PO TABS: 4.0000 mg | ORAL_TABLET | Freq: Four times a day (QID) | ORAL | Status: DC | PRN

## 2011-07-27 MED ORDER — ALBUTEROL SULFATE (5 MG/ML) 0.5% IN NEBU
2.5000 mg | INHALATION_SOLUTION | RESPIRATORY_TRACT | Status: DC
  Administered 2011-07-28 – 2011-07-29 (×6): 2.5 mg via RESPIRATORY_TRACT
  Filled 2011-07-27 (×6): qty 0.5

## 2011-07-27 MED ORDER — HYDROMORPHONE HCL PF 1 MG/ML IJ SOLN: 0.5000 mg | INTRAMUSCULAR | Status: DC | PRN

## 2011-07-27 MED ORDER — ONDANSETRON HCL 4 MG/2ML IJ SOLN: 4.0000 mg | Freq: Four times a day (QID) | INTRAMUSCULAR | Status: DC | PRN

## 2011-07-27 MED ORDER — SODIUM CHLORIDE 0.9 % IJ SOLN: 3.0000 mL | INTRAMUSCULAR | Status: DC | PRN

## 2011-07-27 MED ORDER — METHYLPREDNISOLONE SODIUM SUCC 125 MG IJ SOLR
80.0000 mg | Freq: Three times a day (TID) | INTRAMUSCULAR | Status: DC
  Filled 2011-07-27: qty 1.28

## 2011-07-27 MED ORDER — SODIUM CHLORIDE 0.9 % IV SOLN: 250.0000 mL | INTRAVENOUS | Status: DC | PRN

## 2011-07-27 MED ORDER — ALBUTEROL (5 MG/ML) CONTINUOUS INHALATION SOLN
15.0000 mg | INHALATION_SOLUTION | RESPIRATORY_TRACT | Status: DC
  Administered 2011-07-27 (×2): 15 mg via RESPIRATORY_TRACT

## 2011-07-27 MED ORDER — PREDNISONE 20 MG PO TABS
60.0000 mg | ORAL_TABLET | Freq: Once | ORAL | Status: AC
  Administered 2011-07-27: 60 mg via ORAL
  Filled 2011-07-27: qty 3

## 2011-07-27 MED ORDER — DEXTROSE 5 % IV SOLN
500.0000 mg | INTRAVENOUS | Status: DC
  Administered 2011-07-27 – 2011-07-28 (×2): 500 mg via INTRAVENOUS
  Filled 2011-07-27 (×2): qty 500

## 2011-07-27 MED ORDER — ALBUTEROL SULFATE (5 MG/ML) 0.5% IN NEBU
2.5000 mg | INHALATION_SOLUTION | Freq: Once | RESPIRATORY_TRACT | Status: AC
  Administered 2011-07-27: 2.5 mg via RESPIRATORY_TRACT
  Filled 2011-07-27: qty 0.5

## 2011-07-27 MED ORDER — IPRATROPIUM BROMIDE 0.02 % IN SOLN
0.5000 mg | RESPIRATORY_TRACT | Status: DC
  Administered 2011-07-28 – 2011-07-29 (×6): 0.5 mg via RESPIRATORY_TRACT
  Filled 2011-07-27 (×6): qty 2.5

## 2011-07-27 MED ORDER — SODIUM CHLORIDE 0.9 % IV SOLN
INTRAVENOUS | Status: AC
  Administered 2011-07-28: via INTRAVENOUS

## 2011-07-27 MED ORDER — ALBUTEROL (5 MG/ML) CONTINUOUS INHALATION SOLN
10.0000 mg/h | INHALATION_SOLUTION | Freq: Once | RESPIRATORY_TRACT | Status: AC
  Administered 2011-07-27: 10 mg/h via RESPIRATORY_TRACT

## 2011-07-27 MED ORDER — ALBUTEROL SULFATE (5 MG/ML) 0.5% IN NEBU
INHALATION_SOLUTION | RESPIRATORY_TRACT | Status: AC
  Filled 2011-07-27: qty 3

## 2011-07-27 MED ORDER — ZOLPIDEM TARTRATE 5 MG PO TABS: 5.0000 mg | ORAL_TABLET | Freq: Every evening | ORAL | Status: DC | PRN

## 2011-07-27 MED ORDER — IPRATROPIUM BROMIDE 0.02 % IN SOLN
0.5000 mg | Freq: Once | RESPIRATORY_TRACT | Status: AC
  Administered 2011-07-27: 0.5 mg via RESPIRATORY_TRACT
  Filled 2011-07-27: qty 2.5

## 2011-07-27 MED ORDER — SODIUM CHLORIDE 0.9 % IJ SOLN
3.0000 mL | Freq: Two times a day (BID) | INTRAMUSCULAR | Status: DC
  Administered 2011-07-28 (×2): 3 mL via INTRAVENOUS

## 2011-07-27 MED ORDER — PREDNISONE 20 MG PO TABS
10.0000 mg | ORAL_TABLET | Freq: Every day | ORAL | Status: DC
  Administered 2011-07-27: 10 mg via ORAL
  Filled 2011-07-27 (×2): qty 1

## 2011-07-27 MED ORDER — ALUM & MAG HYDROXIDE-SIMETH 200-200-20 MG/5ML PO SUSP: 30.0000 mL | Freq: Four times a day (QID) | ORAL | Status: DC | PRN

## 2011-07-27 MED ORDER — ACETAMINOPHEN 325 MG PO TABS: 650.0000 mg | ORAL_TABLET | Freq: Four times a day (QID) | ORAL | Status: DC | PRN

## 2011-07-27 MED ORDER — ONDANSETRON HCL 4 MG/2ML IJ SOLN: 4.0000 mg | Freq: Three times a day (TID) | INTRAMUSCULAR | Status: AC | PRN

## 2011-07-27 MED ORDER — METHYLPREDNISOLONE SODIUM SUCC 125 MG IJ SOLR
125.0000 mg | Freq: Four times a day (QID) | INTRAMUSCULAR | Status: DC
  Administered 2011-07-27 – 2011-07-28 (×2): 125 mg via INTRAVENOUS
  Filled 2011-07-27 (×4): qty 2

## 2011-07-27 MED ORDER — PREDNISONE 20 MG PO TABS: 60.0000 mg | ORAL_TABLET | Freq: Every day | ORAL | Status: DC

## 2011-07-27 MED ORDER — OXYCODONE HCL 5 MG PO TABS
5.0000 mg | ORAL_TABLET | ORAL | Status: DC | PRN
  Administered 2011-07-28 (×3): 5 mg via ORAL
  Filled 2011-07-27 (×4): qty 1

## 2011-07-27 MED ORDER — ALBUTEROL SULFATE (5 MG/ML) 0.5% IN NEBU: 5.0000 mg | INHALATION_SOLUTION | RESPIRATORY_TRACT | Status: AC | PRN

## 2011-07-27 MED ORDER — ACETAMINOPHEN 650 MG RE SUPP: 650.0000 mg | Freq: Four times a day (QID) | RECTAL | Status: DC | PRN

## 2011-07-27 NOTE — H&P (Signed)
[facility] History and Physical  [name] YQM:[id number] DOB: January 20, 1965 DOA: 07/27/2011  Referring Physician:  [name] Dr. Mancel Bale PCP:  At [facility]  Pulmonologist:  Dr. Mordecai Maes at [facility]   Chief Complaint:  SOB  HPI:   46 year old male with sarcoidosis who presents to the ED with complaints of worsening SOB over the past 2 days, and present with hypoxia was placed on BiPAP and administered Nebulizer treatments and iv steroids but had only minimal relief. He was thereby referred for medical admission. On interview he reports having to take steroidal therapy intermittently for his sarcoid.  He reports having a cough productive of yellow phlem.  He denies having fevers or chills.    Review of Systems:  The patient denies anorexia, fever, weight loss, vision loss, decreased hearing, hoarseness, chest pain, syncope, dyspnea on exertion, peripheral edema, balance deficits, hemoptysis, abdominal pain, melena, hematochezia, severe indigestion/heartburn, hematuria, incontinence, genital sores, muscle weakness, suspicious skin lesions, transient blindness, difficulty walking, depression, unusual weight change, abnormal bleeding, enlarged lymph nodes, angioedema.    Past Medical History  Diagnosis Date  . Hypertension   . Sarcoidosis   . OSA on CPAP   . Chronic respiratory failure     hom oxygen PRN  . Bronchitis   . Pneumonia    Past Surgical History  Procedure Date  . Rotator cuff repair   . Arm surgery         Allergies  Allergen Reactions  . Clindamycin/Lincomycin Swelling    Prior to Admission medications   Medication Sig Start Date End Date Taking? Authorizing Provider  albuterol (PROVENTIL HFA;VENTOLIN HFA) 108 (90 BASE) MCG/ACT inhaler Inhale 2 puffs into the lungs every 6 (six) hours as needed for wheezing or shortness of breath. 04/13/11 04/12/12  Henderson Cloud, MD  albuterol (PROVENTIL) (2.5 MG/3ML)  0.083% nebulizer solution Take 3 mLs (2.5 mg total) by nebulization every 6 (six) hours as needed. For shortness of breath 06/15/11   Gerhard Munch, MD  amitriptyline (ELAVIL) 25 MG tablet Take 25 mg by mouth at bedtime as needed. For sleep    Historical Provider, MD  Calcium Carbonate-Vitamin D (CALCIUM + D PO) Take 1 tablet by mouth daily.    Historical Provider, MD  [facility] Liver Oil CAPS Take 1 capsule by mouth daily.    Historical Provider, MD  cyclobenzaprine (FLEXERIL) 10 MG tablet Take 10 mg by mouth 3 (three) times daily as needed. Muscle spasms.    Historical Provider, MD  Dextromethorphan-Guaifenesin (TUSSIN DM) 10-100 MG/5ML liquid Take 10 mLs by mouth every 4 (four) hours. For cough    Historical Provider, MD  diphenhydrAMINE (SOMINEX) 25 MG tablet Take 25 mg by mouth as needed. For itching    Historical Provider, MD  guaiFENesin (MUCINEX) 600 MG 12 hr tablet Take 1,200 mg by mouth 2 (two) times daily.    Historical Provider, MD  lidocaine (LIDODERM) 5 % Place 1 patch onto the skin daily. Remove & Discard patch within 12 hours or as directed by MD    Historical Provider, MD  Multiple Vitamins-Minerals (MULTIVITAMIN WITH MINERALS) tablet Take 1 tablet by mouth daily.    Historical Provider, MD  pregabalin (LYRICA) 200 MG capsule Take 200 mg by mouth 2 (two) times daily.    Historical Provider, MD  traZODone (DESYREL) 100 MG tablet Take 100 mg by mouth at bedtime as needed. sleep    Historical Provider, MD  vardenafil (LEVITRA) 10 MG  tablet Take 10 mg by mouth as needed.    Historical Provider, MD     Social History:  reports that he has never smoked. He has never used smokeless tobacco. He reports that he drinks alcohol. He reports that he does not use illicit drugs. Patient lives independently, and is able to perform his ADLs.    Family History  Problem Relation Age of Onset  . Diabetes Father      Physical Exam:  GEN:  Pleasant 46 year old well nourished and well developed  African American male examined  and in no acute distress; cooperative with exam Filed Vitals:   07/27/11 2056 07/27/11 2100 07/27/11 2153 07/27/11 2230  BP:  168/121  160/122  Pulse:  114  120  Temp:      TempSrc:      Resp:  22  29  SpO2: 96% 100% 100% 100%   Blood pressure 160/122, pulse 120, temperature 97.5 F (36.4 C), temperature source Axillary, resp. rate 29, SpO2 100.00%. PSYCH: He is alert and oriented x4; does not appear anxious does not appear depressed; affect is normal HEENT: Normocephalic and Atraumatic, Mucous membranes pink; PERRLA; EOM intact; Fundi:  Benign;  No scleral icterus, Nares: Patent, Oropharynx: Clear,  Fair Dentition, Neck:  FROM, no cervical lymphadenopathy nor thyromegaly or carotid bruit; no JVD; Breasts:: Not examined CHEST WALL: No tenderness CHEST: Decreased Breath Sounds Bilaterally, Diffuse Rhonchi, No audible wheezing, No rales.   HEART: Tachycardic rate and  Regular rhythm; no murmurs rubs or gallops BACK: No kyphosis or scoliosis; no CVA tenderness ABDOMEN: Positive Bowel Sounds, soft non-tender; no masses, no organomegaly. Rectal Exam: Not done EXTREMITIES: No bone or joint deformity; age-appropriate arthropathy of the hands and knees; no cyanosis, clubbing or edema; no ulcerations. Genitalia: not examined PULSES: 2+ and symmetric SKIN: Normal hydration no rash or ulceration CNS: Cranial nerves 2-12 grossly intact no focal neurologic deficit   Labs & Imaging Results for orders placed during the hospital encounter of 07/27/11 (from the past 48 hour(s))  BASIC METABOLIC PANEL     Status: Abnormal   Collection Time   07/27/11  4:00 PM      Component Value Range Comment   Sodium 136  135 - 145 mEq/L    Potassium 3.6  3.5 - 5.1 mEq/L    Chloride 97  96 - 112 mEq/L    CO2 26  19 - 32 mEq/L    Glucose, Bld 177 (*) 70 - 99 mg/dL    BUN 11  6 - 23 mg/dL    Creatinine, Ser 0.45  0.50 - 1.35 mg/dL    Calcium 9.9  8.4 - 40.9 mg/dL    GFR calc non  Af Amer >90  >90 mL/min    GFR calc Af Amer >90  >90 mL/min   CBC     Status: Normal   Collection Time   07/27/11  4:00 PM      Component Value Range Comment   WBC 7.8  4.0 - 10.5 K/uL    RBC 5.23  4.22 - 5.81 MIL/uL    Hemoglobin 13.9  13.0 - 17.0 g/dL    HCT 81.1  91.4 - 78.2 %    MCV 79.5  78.0 - 100.0 fL    MCH 26.6  26.0 - 34.0 pg    MCHC 33.4  30.0 - 36.0 g/dL    RDW 95.6  21.3 - 08.6 %    Platelets 371  150 - 400 K/uL  BLOOD GAS, ARTERIAL     Status: Abnormal   Collection Time   07/27/11  4:54 PM      Component Value Range Comment   FIO2 0.60      Delivery systems BILEVEL POSITIVE AIRWAY PRESSURE      Rate 10.0      Peep/cpap 6.0      Pressure support 12.0      pH, Arterial 7.414  7.350 - 7.450    pCO2 arterial 42.3  35.0 - 45.0 mmHg    pO2, Arterial 264.0 (*) 80.0 - 100.0 mmHg    Bicarbonate 26.5 (*) 20.0 - 24.0 mEq/L    TCO2 23.4  0 - 100 mmol/L    Acid-Base Excess 2.2 (*) 0.0 - 2.0 mmol/L    O2 Saturation 99.4      Patient temperature 98.6      Collection site RIGHT BRACHIAL      Drawn by COLLECTED BY RT      Sample type ARTERIAL DRAW      Allens test (pass/fail) PASS  PASS    Dg Chest Port 1 View  07/27/2011  *RADIOLOGY REPORT*  Clinical Data: Respiratory distress  PORTABLE CHEST - 1 VIEW  Comparison: 06/15/2011  Findings: The cardiac shadow is stable.  Bilateral chronic changes are again seen.  No definitive acute infiltrate is noted.  No pneumothorax is seen.  No sizable effusion is noted.  IMPRESSION: Chronic changes without definitive acute abnormality.  Original Report Authenticated By: Phillips Odor, M.D.    EKG: Independently reviewed.   Sinus Tachycardia Rate 120, No Acute S-T changes   Assessment: Principal Problem:  *Obstructive chronic bronchitis with acute exacerbation Active Problems:  Sarcoid  OSA on CPAP  Hyperglycemia, drug-induced   Plan: 1. Obstructive Chronic Bronchitis/COPD-  Admitted to Stepdown in the event he may require BIPAP or  intubation for Respiratory distress,  Started on a High Dose IV Steroid Taper, Azithromycin Duonebs, O2.   2.  Sarcoid-  Same as above #1 3.  SOB due to #1 and  #2 4.  OSA- on CPAP qhs at home.   5. Hyperglycemia-  SSI coverage, and place on CHO modified medium diet, most like elevated glucose due to steroid therapy.  Check HbA1C.   6.  DVt prophylaxis 7. Reoncile home medications. 8. Other plans as per orders.    CODE STATUS:      FULL CODE    Critical care time: 60 minutes. Disposition Plan: To Return Home    Ron Parker [facility] Pager [phone]  If 7PM-7AM, please contact night-coverage [email or web address] Password [facility] 07/27/2011, 11:24 PM

## 2011-07-27 NOTE — ED Notes (Signed)
MD at bedside. 

## 2011-07-27 NOTE — ED Notes (Signed)
PT reports shob since last night. Hx of sarcoidosis. Pt on O2 2L- sats 88%. Pt reports right sided chest pain, diaphoretic.

## 2011-07-27 NOTE — ED Notes (Signed)
Paged resp for another continuous neb treatment.

## 2011-07-27 NOTE — ED Notes (Signed)
Pt asked if he could take BIPAP off, informed he could not. Pt took BIPAP off and drank fluids, reported "he had nothing to drink all day". rn called RT who told rn to put pt on [place] and she would be down shortly.

## 2011-07-27 NOTE — ED Provider Notes (Signed)
History     CSN: [id number]  Arrival date & time 07/27/11  1544   First MD Initiated Contact with Patient 07/27/11 1556      Chief Complaint  Patient presents with  . Shortness of Breath    (Consider location/radiation/quality/duration/timing/severity/associated sxs/prior treatment) HPI Comments: [name] is a 46 y.o. Male who began having trouble breathing yesterday. A similar episode 3 weeks ago, treated here with her bronchodilators, and discharged with prednisone. Today, he gets severe shortness of breath with any activity. He has had diaphoresis. He was not out side today prior to coming here. He has been using his medicines as prescribed. He has no local doctor. He last saw his pulmonologist about 18 months ago.  Patient is a 46 y.o. male presenting with shortness of breath. The history is provided by the patient.  Shortness of Breath  Associated symptoms include shortness of breath.    Past Medical History  Diagnosis Date  . Hypertension   . Sarcoidosis   . OSA on CPAP   . Chronic respiratory failure     hom oxygen PRN  . Bronchitis   . Pneumonia     Past Surgical History  Procedure Date  . Rotator cuff repair   . Arm surgery      Family History  Problem Relation Age of Onset  . Diabetes Father     History  Substance Use Topics  . Smoking status: Never Smoker   . Smokeless tobacco: Never Used  . Alcohol Use: Yes     social      Review of Systems  Respiratory: Positive for shortness of breath.   All other systems reviewed and are negative.    Allergies  Clindamycin/lincomycin  Home Medications   Current Outpatient Rx  Name Route Sig Dispense Refill  . ALBUTEROL SULFATE HFA 108 (90 BASE) MCG/ACT IN AERS Inhalation Inhale 2 puffs into the lungs every 6 (six) hours as needed for wheezing or shortness of breath. 1 Inhaler 1  . ALBUTEROL SULFATE (2.5 MG/3ML) 0.083% IN NEBU Nebulization Take 3 mLs (2.5 mg total) by nebulization every 6 (six)  hours as needed. For shortness of breath 75 mL 0  . AMITRIPTYLINE HCL 25 MG PO TABS Oral Take 25 mg by mouth at bedtime as needed. For sleep    . CALCIUM + D PO Oral Take 1 tablet by mouth daily.    . COD LIVER OIL PO CAPS Oral Take 1 capsule by mouth daily.    . CYCLOBENZAPRINE HCL 10 MG PO TABS Oral Take 10 mg by mouth 3 (three) times daily as needed. Muscle spasms.    . DEXTROMETHORPHAN-GUAIFENESIN 10-100 MG/5ML PO SYRP Oral Take 10 mLs by mouth every 4 (four) hours. For cough    . DIPHENHYDRAMINE HCL (SLEEP) 25 MG PO TABS Oral Take 25 mg by mouth as needed. For itching    . GUAIFENESIN ER 600 MG PO TB12 Oral Take 1,200 mg by mouth 2 (two) times daily.    Marland Kitchen LIDOCAINE 5 % EX PTCH Transdermal Place 1 patch onto the skin daily. Remove & Discard patch within 12 hours or as directed by MD    . MULTI-VITAMIN/MINERALS PO TABS Oral Take 1 tablet by mouth daily.    Marland Kitchen PREGABALIN 200 MG PO CAPS Oral Take 200 mg by mouth 2 (two) times daily.    . TRAZODONE HCL 100 MG PO TABS Oral Take 100 mg by mouth at bedtime as needed. sleep    . VARDENAFIL HCL  10 MG PO TABS Oral Take 10 mg by mouth as needed.      BP 160/106  Pulse 131  Temp 98.8 F (37.1 C) (Oral)  Resp 24  SpO2 96%  Physical Exam  Nursing note and vitals reviewed. Constitutional: He is oriented to person, place, and time. He appears well-developed and well-nourished.  HENT:  Head: Normocephalic and atraumatic.  Right Ear: External ear normal.  Left Ear: External ear normal.  Eyes: Conjunctivae and EOM are normal. Pupils are equal, round, and reactive to light.  Neck: Normal range of motion and phonation normal. Neck supple.  Cardiovascular: Normal rate, regular rhythm, normal heart sounds and intact distal pulses.   Pulmonary/Chest: He is in respiratory distress (moderate). He has wheezes. He exhibits no tenderness and no bony tenderness.  Abdominal: Soft. Normal appearance. He exhibits no mass. There is no tenderness. There is no  guarding.  Musculoskeletal: Normal range of motion.  Neurological: He is alert and oriented to person, place, and time. He has normal strength. No cranial nerve deficit or sensory deficit. He exhibits normal muscle tone. Coordination normal.  Skin: Skin is warm and intact.       Diaphoretic  Psychiatric: He has a normal mood and affect. His behavior is normal. Judgment and thought content normal.    ED Course  Procedures (including critical care time)  Emergency department treatment: BiPAP. Continuous nebulizer. Prednisone oral.   Date: 07/27/2011  Rate: 120  Rhythm: sinus tachycardia  QRS Axis: normal  Intervals: normal  ST/T Wave abnormalities: nonspecific T wave changes  Conduction Disutrbances:none  Narrative Interpretation: biatrial enlargement  Old EKG Reviewed: unchanged  6:46 PM- patient more comfortable now. He still tachycardic to 118. Lungs have improved air movement, but persistent expiratory wheezing . Repeat nebulizer ordered .   21:20- HR now 108. Patient has been weaned off BiPAP. Mild to moderate work of breathing. Persistent generalized wheezing. He, states that he uses his CPAP intermittently at home for sleep apnea. He also has oxygen to use when necessary at home.  CRITICAL CARE Performed by: Flint Melter   Total critical care time: 40 minutes  Critical care time was exclusive of separately billable procedures and treating other patients.  Critical care was necessary to treat or prevent imminent or life-threatening deterioration.  Critical care was time spent personally by me on the following activities: development of treatment plan with patient and/or surrogate as well as nursing, discussions with consultants, evaluation of patient's response to treatment, examination of patient, obtaining history from patient or surrogate, ordering and performing treatments and interventions, ordering and review of laboratory studies, ordering and review of  radiographic studies, pulse oximetry and re-evaluation of patient's condition.   Labs Reviewed  BASIC METABOLIC PANEL - Abnormal; Notable for the following:    Glucose, Bld 177 (*)     All other components within normal limits  BLOOD GAS, ARTERIAL - Abnormal; Notable for the following:    pO2, Arterial 264.0 (*)     Bicarbonate 26.5 (*)     Acid-Base Excess 2.2 (*)     All other components within normal limits  CBC  URINALYSIS, ROUTINE W REFLEX MICROSCOPIC  URINE CULTURE  CULTURE, RESPIRATORY   Dg Chest Port 1 View  07/27/2011  *RADIOLOGY REPORT*  Clinical Data: Respiratory distress  PORTABLE CHEST - 1 VIEW  Comparison: 06/15/2011  Findings: The cardiac shadow is stable.  Bilateral chronic changes are again seen.  No definitive acute infiltrate is noted.  No pneumothorax is  seen.  No sizable effusion is noted.  IMPRESSION: Chronic changes without definitive acute abnormality.  Original Report Authenticated By: Phillips Odor, M.D.     1. COPD exacerbation       MDM  COPD exacerbation, with persistent increased work of breathing. She'll need to be admitted for monitoring and additional treatment. Doubt pneumonia, PE, ACS, or metabolic instability. No evidence for hypercapnia.     Admit as medicine unassigned patient       Flint Melter, MD 07/27/11 2159

## 2011-07-28 DIAGNOSIS — R7309 Other abnormal glucose: Secondary | ICD-10-CM

## 2011-07-28 DIAGNOSIS — D869 Sarcoidosis, unspecified: Secondary | ICD-10-CM

## 2011-07-28 DIAGNOSIS — G4733 Obstructive sleep apnea (adult) (pediatric): Secondary | ICD-10-CM

## 2011-07-28 DIAGNOSIS — J441 Chronic obstructive pulmonary disease with (acute) exacerbation: Principal | ICD-10-CM

## 2011-07-28 DIAGNOSIS — T50904A Poisoning by unspecified drugs, medicaments and biological substances, undetermined, initial encounter: Secondary | ICD-10-CM

## 2011-07-28 LAB — BASIC METABOLIC PANEL
BUN: 8 mg/dL (ref 6–23)
Calcium: 9.5 mg/dL (ref 8.4–10.5)
Creatinine, Ser: 0.78 mg/dL (ref 0.50–1.35)
GFR calc Af Amer: >90 mL/min (ref >90)
GFR calc non Af Amer: >90 mL/min (ref >90)

## 2011-07-28 LAB — GLUCOSE, CAPILLARY
Glucose-Capillary: 121 mg/dL — ABNORMAL HIGH (ref 70–99)
Glucose-Capillary: 138 mg/dL — ABNORMAL HIGH (ref 70–99)

## 2011-07-28 LAB — URINALYSIS, ROUTINE W REFLEX MICROSCOPIC
Glucose, UA: >1000 mg/dL — AB
Hgb urine dipstick: NEGATIVE
Specific Gravity, Urine: 1.023 (ref 1.005–1.030)

## 2011-07-28 LAB — MRSA PCR SCREENING: MRSA by PCR: NEGATIVE

## 2011-07-28 LAB — URINE MICROSCOPIC-ADD ON

## 2011-07-28 LAB — CBC
MCHC: 32.5 g/dL (ref 30.0–36.0)
Platelets: 313 10*3/uL (ref 150–400)
RDW: 13.3 % (ref 11.5–15.5)

## 2011-07-28 MED ORDER — POTASSIUM CHLORIDE CRYS ER 20 MEQ PO TBCR
40.0000 meq | EXTENDED_RELEASE_TABLET | Freq: Once | ORAL | Status: AC
  Administered 2011-07-28: 40 meq via ORAL
  Filled 2011-07-28: qty 2

## 2011-07-28 MED ORDER — DM-GUAIFENESIN ER 30-600 MG PO TB12
1.0000 | ORAL_TABLET | Freq: Two times a day (BID) | ORAL | Status: DC
  Administered 2011-07-28 (×2): 1 via ORAL
  Filled 2011-07-28 (×4): qty 1

## 2011-07-28 MED ORDER — INSULIN ASPART 100 UNIT/ML ~~LOC~~ SOLN: 0.0000 [IU] | Freq: Every day | SUBCUTANEOUS | Status: DC

## 2011-07-28 MED ORDER — PREDNISONE 50 MG PO TABS
50.0000 mg | ORAL_TABLET | Freq: Every day | ORAL | Status: DC
  Filled 2011-07-28 (×2): qty 1

## 2011-07-28 MED ORDER — PREDNISONE 50 MG PO TABS
50.0000 mg | ORAL_TABLET | Freq: Every day | ORAL | Status: DC
  Administered 2011-07-29: 50 mg via ORAL
  Filled 2011-07-28 (×2): qty 1

## 2011-07-28 MED ORDER — METHYLPREDNISOLONE SODIUM SUCC 40 MG IJ SOLR: 40.0000 mg | Freq: Three times a day (TID) | INTRAMUSCULAR | Status: DC

## 2011-07-28 MED ORDER — INSULIN ASPART 100 UNIT/ML ~~LOC~~ SOLN
0.0000 [IU] | Freq: Three times a day (TID) | SUBCUTANEOUS | Status: DC
  Administered 2011-07-28 (×2): 2 [IU] via SUBCUTANEOUS

## 2011-07-28 MED ORDER — METHYLPREDNISOLONE SODIUM SUCC 125 MG IJ SOLR
60.0000 mg | Freq: Four times a day (QID) | INTRAMUSCULAR | Status: AC
  Administered 2011-07-28 (×2): 60 mg via INTRAVENOUS
  Filled 2011-07-28: qty 0.96

## 2011-07-28 NOTE — Progress Notes (Signed)
CARE MANAGEMENT NOTE 07/28/2011  Patient:  [name]   Account Number:  [id number]  Date Initiated:  07/28/2011  Documentation initiated by:  DAVIS,RHONDA  Subjective/Objective Assessment:   pt admitted with increased dyspnea requiring bipap with peep at 6.0     Action/Plan:   lives independantly at home   Anticipated DC Date:  07/29/2011   Anticipated DC Plan:  HOME/SELF CARE  In-house referral  NA      DC Planning Services  NA      [facility] Choice  NA   Choice offered to / List presented to:  NA   DME arranged  NA      DME agency  NA     HH arranged  NA      HH agency  NA   Status of service:  In process, will continue to follow Medicare Important Message given?  NA - LOS <3 / Initial given by admissions (If response is "NO", the following Medicare IM given date fields will be blank) Date Medicare IM given:   Date Additional Medicare IM given:    Discharge Disposition:    Per UR Regulation:  Reviewed for med. necessity/level of care/duration of stay  If discussed at Long Length of Stay Meetings, dates discussed:    Comments:  [id number] [name] Smiling, RN, BSN, CCM No discharge needs present at time of this review Case Management [phone]

## 2011-07-28 NOTE — Progress Notes (Addendum)
[facility] PROGRESS NOTE  [name] WUJ:[id number] DOB: [birth year] DOA: 07/27/2011 PCP: No primary provider on file.  Assessment/Plan: Principal Problem:  *Obstructive chronic bronchitis with acute exacerbation Active Problems:  Sarcoid  OSA on CPAP  Hyperglycemia, drug-induced    1.Obstructive Chronic Bronchitis/COPD-  -Clinically improved this a.m., we'll decrease solumedrol, follow and change to prednisone in the a.m. -continue Antibiotics, supplemental oxygen, add mucinex -Will transfer from step down to the floor. 2. Sarcoid- Same as above #1  3. SOB due to #1 and #2  4. OSA- continue CPAP qhs as per home regimen.  5. Hyperglycemia- SSI coverage, modified medium diet, most like elevated glucose due to steroid therapy. Follow up HbA1C.  6. DVt prophylaxis   Code Status:full code Disposition Plan: Transfer to floor   Brief narrative: The patient is a6 year old male with sarcoidosis admitted with complaints of worsening SOB over the past 2 days, and present with hypoxia was placed on BiPAP and administered Nebulizer treatments and iv steroids but had only minimal relief.  He he reported that he intermittently takes steroids  for his sarcoids. He reports having a cough productive of yellow phlem. He denied having fevers or chills.He was admitted to the step down unit for further eval and management.   Consultants:  none  Procedures:  none  Antibiotics:  Zithromax -started 7/16.   HPI/Subjective: States He feels much better this a.m., breathing better and no further cough.  Objective: Filed Vitals:   07/28/11 0452 07/28/11 0500 07/28/11 0730 07/28/11 0800  BP:  111/78 145/92   Pulse:  68 97   Temp:    98 F (36.7 C)  TempSrc:    Oral  Resp:  12 22   Height:      Weight:      SpO2: 98% 96% 99%     Intake/Output Summary (Last 24 hours) at 07/28/11 0911 Last data filed at 07/28/11 0800  Gross per 24 hour  Intake   1013 ml  Output    400 ml    Net    613 ml    Exam:   General:   Middle aged BM in no apparent distress with nasal cannula oxygen on.  Cardiovascular: Regular rate and rhythm rhythm, normal S1-S2  Respiratory: moderate Air movement, no crackles or wheezes  Abdomen: soft, Bowel sounds present nontender nondistended  Extremities: No cyanosis and no edema  Data Reviewed: Basic Metabolic Panel:  Lab 07/28/11 9562 07/27/11 1600  NA 134* 136  K 3.4* 3.6  CL 95* 97  CO2 25 26  GLUCOSE 221* 177*  BUN 8 11  CREATININE 0.78 0.83  CALCIUM 9.5 9.9  MG -- --  PHOS -- --   Liver Function Tests: No results found for this basename: AST:5,ALT:5,ALKPHOS:5,BILITOT:5,PROT:5,ALBUMIN:5 in the last 168 hours No results found for this basename: LIPASE:5,AMYLASE:5 in the last 168 hours No results found for this basename: AMMONIA:5 in the last 168 hours CBC:  Lab 07/28/11 0315 07/27/11 1600  WBC 4.8 7.8  NEUTROABS -- --  HGB 12.6* 13.9  HCT 38.8* 41.6  MCV 80.2 79.5  PLT 313 371   Cardiac Enzymes: No results found for this basename: CKTOTAL:5,CKMB:5,CKMBINDEX:5,TROPONINI:5 in the last 168 hours BNP (last 3 results)  Basename 04/11/11 2220  PROBNP 62.0   CBG: No results found for this basename: GLUCAP:5 in the last 168 hours  Recent Results (from the past 240 hour(s))  MRSA PCR SCREENING     Status: Normal   Collection Time  07/27/11 11:26 PM      Component Value Range Status Comment   MRSA by PCR NEGATIVE  NEGATIVE Final   CULTURE, RESPIRATORY     Status: Normal (Preliminary result)   Collection Time   07/28/11 12:30 AM      Component Value Range Status Comment   Specimen Description SPUTUM   Final    Special Requests Normal   Final    Gram Stain     Final    Value: RARE WBC PRESENT,BOTH PMN AND MONONUCLEAR     NO SQUAMOUS EPITHELIAL CELLS SEEN     RARE GRAM POSITIVE COCCI IN PAIRS   Culture PENDING   Incomplete    Report Status PENDING   Incomplete      Studies: Dg Chest Port 1  View  07/27/2011  *RADIOLOGY REPORT*  Clinical Data: Respiratory distress  PORTABLE CHEST - 1 VIEW  Comparison: 06/15/2011  Findings: The cardiac shadow is stable.  Bilateral chronic changes are again seen.  No definitive acute infiltrate is noted.  No pneumothorax is seen.  No sizable effusion is noted.  IMPRESSION: Chronic changes without definitive acute abnormality.  Original Report Authenticated By: Phillips Odor, M.D.    Scheduled Meds:   . sodium chloride   Intravenous STAT  . albuterol  2.5 mg Nebulization Once  . ipratropium  0.5 mg Nebulization Q4H   And  . albuterol  2.5 mg Nebulization Q4H  . albuterol      . albuterol  10 mg/hr Nebulization Once  . azithromycin  500 mg Intravenous Q24H  . enoxaparin (LOVENOX) injection  40 mg Subcutaneous QHS  . insulin aspart  0-15 Units Subcutaneous TID WC  . insulin aspart  0-5 Units Subcutaneous QHS  . ipratropium  0.5 mg Nebulization Once  . methylPREDNISolone (SOLU-MEDROL) injection  60 mg Intravenous Q6H  . potassium chloride  40 mEq Oral Once  . predniSONE  50 mg Oral Q breakfast  . predniSONE  60 mg Oral Once  . sodium chloride  3 mL Intravenous Q12H  . DISCONTD: methylPREDNISolone (SOLU-MEDROL) injection  125 mg Intravenous Q6H  . DISCONTD: methylPREDNISolone (SOLU-MEDROL) injection  80 mg Intravenous Q8H  . DISCONTD: methylPREDNISolone (SOLU-MEDROL) injection  40 mg Intravenous Q8H  . DISCONTD: predniSONE  10 mg Oral Q breakfast  . DISCONTD: predniSONE  50 mg Oral Q breakfast  . DISCONTD: predniSONE  60 mg Oral Q breakfast   Continuous Infusions:   . albuterol 15 mg (07/27/11 2152)    Principal Problem:  *Obstructive chronic bronchitis with acute exacerbation Active Problems:  Sarcoid  OSA on CPAP  Hyperglycemia, drug-induced    Time spent:    [facility] C  [facility] Pager [phone]. If 8PM-8AM, please contact night-coverage at [email or web address], password [facility] 07/28/2011, 9:11 AM  LOS: 1 day

## 2011-07-28 NOTE — Progress Notes (Signed)
Brief Nutrition Note  Reason: Nutrition Risk for unintentional weight loss > 10 lb over 1 month and dysphagia.   Patient reported a good appetite and intake. He reported he ate well PTA. Patient reported sometimes pieces of foods get caught in the throat upon swallowing but denies the need for a change in the consistency of foods provided. Patient reported a UBW of 167 lb. Patient is currently 91.6% of UBW. Patient denies the need for nutrition intervention, he stated he is eating well. Patient not at nutrition risk at this time.   RD to monitor weights and PO intake for adequacy.   RD available for nutrition needs.   Iven Finn [facility] [phone]

## 2011-07-29 LAB — BASIC METABOLIC PANEL
CO2: 29 mEq/L (ref 19–32)
Calcium: 9.7 mg/dL (ref 8.4–10.5)
Potassium: 4.3 mEq/L (ref 3.5–5.1)
Sodium: 135 mEq/L (ref 135–145)

## 2011-07-29 LAB — URINE CULTURE
Colony Count: NO GROWTH
Culture: NO GROWTH
Special Requests: NORMAL

## 2011-07-29 MED ORDER — AZITHROMYCIN 250 MG PO TABS: 250.0000 mg | ORAL_TABLET | Freq: Every day | ORAL | Status: DC

## 2011-07-29 MED ORDER — PREDNISONE 50 MG PO TABS: 50.0000 mg | ORAL_TABLET | Freq: Every day | ORAL | Status: DC

## 2011-07-29 MED ORDER — PREDNISONE 50 MG PO TABS: ORAL_TABLET | ORAL | Status: DC

## 2011-07-29 MED ORDER — AZITHROMYCIN 250 MG PO TABS: ORAL_TABLET | ORAL | Status: DC

## 2011-07-29 NOTE — Discharge Summary (Signed)
Physician Discharge Summary  [name] AVW:[id number] DOB: September 06, 1965 DOA: 07/27/2011  PCP: No primary provider on file.  Admit date: 07/27/2011 Discharge date: 07/29/2011  Recommendations for Outpatient Follow-up:  1.PCP in 1-2weeks 2. [place] [facility]-Dr. Delton Coombes, on 8/20 at 4 PM as scheduled Discharge Diagnoses:  Principal Problem:  *Obstructive chronic bronchitis with acute exacerbation Active Problems:  Sarcoid  OSA on CPAP  Hyperglycemia, drug-induced   Discharge Condition: improved/stable  Diet recommendation: modified carb  History of present illness:  The patient is a 46 year old male with sarcoidosis admitted with complaints of worsening SOB over the past 2 days, and presented with hypoxia was placed on BiPAP and administered Nebulizer treatments and iv steroids but had only minimal relief. He he reported that he intermittently takes steroids for his sarcoids. He reports having a cough productive of yellow phlem. He denied having fevers or chills.He was admitted to the step down unit for further eval and management.   Hospital Course by problem list:  1.Obstructive Chronic Bronchitis/COPD-  -As discussed above, upon admission patient was placed on nebulized bronchodilators, Solu-Medrol supplemental oxygen and antibiotics. Was observed in the step down unit initially, responded quickly to the above regimen, he was transferred to the medical floor, and his steroids were tapered and changed prednisone. -His clinically much improved and stable for discharge home, and his to follow up outpatient. -The patient expressed interest in being set up with a pulmonologist here in [place], and following discussion with pulmonology PA this a.m. he has been set of to followup with Dr. Delton Coombes was mobile outpatient -He is to continue his supplemental oxygen outpatient as previously. 2. Sarcoid- he is to follow up outpatient with PCP and lumbar pulmonology. 3. SOB due to #1 and #2  4.  OSA- continue CPAP qhs as per home regimen.  5. Hyperglycemia- his blood glucose on admission was noted to be elevated- 220, he was placed on sliding scale insulin. The impression was that this was steroid induced he received high doses of Solu-Medrol on admission. Subsequent monitoring his blood sugars have ranged from 118 to 140s, he was placed on a modified carbohydrate diet in the hospital he is to followup with his PCP for hemoglobin A1c and further monitoring of his sugars and management as clinically appropriate.   Procedures:  none  Consultations:  none  Discharge Exam: Filed Vitals:   07/29/11 0535  BP: 123/86  Pulse: 100  Temp: 97.9 F (36.6 C)  Resp: 18   Filed Vitals:   07/29/11 0230 07/29/11 0406 07/29/11 0535 07/29/11 0803  BP: 140/87  123/86   Pulse: 96  100   Temp: 98.3 F (36.8 C)  97.9 F (36.6 C)   TempSrc: Oral  Oral   Resp: 18  18   Height:      Weight:      SpO2: 99% 95% 95% 96%   Exam:  General: Middle aged BM in no apparent distress with nasal cannula oxygen on.  Cardiovascular: Regular rate and rhythm rhythm, normal S1-S2  Respiratory: moderate Air movement, no crackles or wheezes  Abdomen: soft, Bowel sounds present nontender nondistended  Extremities: No cyanosis and no edema    Discharge Instructions  Discharge Orders    Future Orders Please Complete By Expires   Diet Carb Modified      Increase activity slowly      Discharge instructions      Comments:   Continue home O2 as previously     Medication List  As  of 07/29/2011  8:48 AM   STOP taking these medications         diphenhydrAMINE 25 MG tablet         TAKE these medications         albuterol 108 (90 BASE) MCG/ACT inhaler   Commonly known as: PROVENTIL HFA;VENTOLIN HFA   Inhale 2 puffs into the lungs every 6 (six) hours as needed for wheezing or shortness of breath.      albuterol (2.5 MG/3ML) 0.083% nebulizer solution   Commonly known as: PROVENTIL   Take 3 mLs (2.5  mg total) by nebulization every 6 (six) hours as needed. For shortness of breath      amitriptyline 25 MG tablet   Commonly known as: ELAVIL   Take 25 mg by mouth at bedtime as needed. For sleep      azithromycin 250 MG tablet   Commonly known as: ZITHROMAX   For 4 more days      CALCIUM + D PO   Take 1 tablet by mouth daily.      chlorthalidone 25 MG tablet   Commonly known as: HYGROTON   Take 25 mg by mouth daily.      Cod Liver Oil Caps   Take 1 capsule by mouth daily.      cyclobenzaprine 10 MG tablet   Commonly known as: FLEXERIL   Take 10 mg by mouth 3 (three) times daily as needed. Muscle spasms.      guaiFENesin 600 MG 12 hr tablet   Commonly known as: MUCINEX   Take 1,200 mg by mouth 2 (two) times daily.      lidocaine 5 %   Commonly known as: LIDODERM   Place 1 patch onto the skin daily. Remove & Discard patch within 12 hours or as directed by MD      multivitamin with minerals tablet   Take 1 tablet by mouth daily.      omeprazole 20 MG capsule   Commonly known as: PRILOSEC   Take 20 mg by mouth daily.      predniSONE 50 MG tablet   Commonly known as: DELTASONE   For days      pregabalin 200 MG capsule   Commonly known as: LYRICA   Take 200 mg by mouth 2 (two) times daily.      PRESCRIPTION MEDICATION   Take 1 tablet by mouth daily. Pt takes a blood pressure medication, however pt doesn't know what this is called. Called all of his pharmacy's that is listed and cant locate rx      traZODone 100 MG tablet   Commonly known as: DESYREL   Take 100 mg by mouth at bedtime as needed. sleep      TUSSIN DM 10-100 MG/5ML liquid   Generic drug: Dextromethorphan-Guaifenesin   Take 10 mLs by mouth every 4 (four) hours. For cough      vardenafil 10 MG tablet   Commonly known as: LEVITRA   Take 10 mg by mouth as needed.              The results of significant diagnostics from this hospitalization (including imaging, microbiology, ancillary and  laboratory) are listed below for reference.    Significant Diagnostic Studies: Dg Chest Port 1 View  07/27/2011  *RADIOLOGY REPORT*  Clinical Data: Respiratory distress  PORTABLE CHEST - 1 VIEW  Comparison: 06/15/2011  Findings: The cardiac shadow is stable.  Bilateral chronic changes are again seen.  No definitive acute  infiltrate is noted.  No pneumothorax is seen.  No sizable effusion is noted.  IMPRESSION: Chronic changes without definitive acute abnormality.  Original Report Authenticated By: Phillips Odor, M.D.    Microbiology: Recent Results (from the past 240 hour(s))  MRSA PCR SCREENING     Status: Normal   Collection Time   07/27/11 11:26 PM      Component Value Range Status Comment   MRSA by PCR NEGATIVE  NEGATIVE Final   CULTURE, RESPIRATORY     Status: Normal (Preliminary result)   Collection Time   07/28/11 12:30 AM      Component Value Range Status Comment   Specimen Description SPUTUM   Final    Special Requests Normal   Final    Gram Stain     Final    Value: RARE WBC PRESENT,BOTH PMN AND MONONUCLEAR     NO SQUAMOUS EPITHELIAL CELLS SEEN     RARE GRAM POSITIVE COCCI IN PAIRS   Culture PENDING   Incomplete    Report Status PENDING   Incomplete   URINE CULTURE     Status: Normal   Collection Time   07/28/11  5:23 AM      Component Value Range Status Comment   Specimen Description URINE, CLEAN CATCH   Final    Special Requests Normal   Final    Culture  Setup Time 07/28/2011 08:59   Final    Colony Count NO GROWTH   Final    Culture NO GROWTH   Final    Report Status 07/29/2011 FINAL   Final      Labs: Basic Metabolic Panel:  Lab 07/29/11 5409 07/28/11 0315 07/27/11 1600  NA 135 134* 136  K 4.3 3.4* 3.6  CL 98 95* 97  CO2 29 25 26   GLUCOSE 118* 221* 177*  BUN 13 8 11   CREATININE 0.88 0.78 0.83  CALCIUM 9.7 9.5 9.9  MG -- -- --  PHOS -- -- --   Liver Function Tests: No results found for this basename: AST:5,ALT:5,ALKPHOS:5,BILITOT:5,PROT:5,ALBUMIN:5 in  the last 168 hours No results found for this basename: LIPASE:5,AMYLASE:5 in the last 168 hours No results found for this basename: AMMONIA:5 in the last 168 hours CBC:  Lab 07/28/11 0315 07/27/11 1600  WBC 4.8 7.8  NEUTROABS -- --  HGB 12.6* 13.9  HCT 38.8* 41.6  MCV 80.2 79.5  PLT 313 371   Cardiac Enzymes: No results found for this basename: CKTOTAL:5,CKMB:5,CKMBINDEX:5,TROPONINI:5 in the last 168 hours BNP: BNP (last 3 results)  Basename 04/11/11 2220  PROBNP 62.0   CBG:  Lab 07/28/11 2152 07/28/11 1615 07/28/11 1207  GLUCAP 138* 121* 140*    Time coordinating discharge:  Signed:  Lillyana Majette C  [facility] 07/29/2011, 8:48 AM

## 2011-08-17 IMAGING — CR DG FOOT COMPLETE 3+V*R*
3 series · 3 of 3 positions shown · non-contrast
Comparison: None.

CLINICAL DATA: Lateral right foot pain and redness and swelling.

RIGHT FOOT COMPLETE - 3+ VIEW

[t foot ap right]
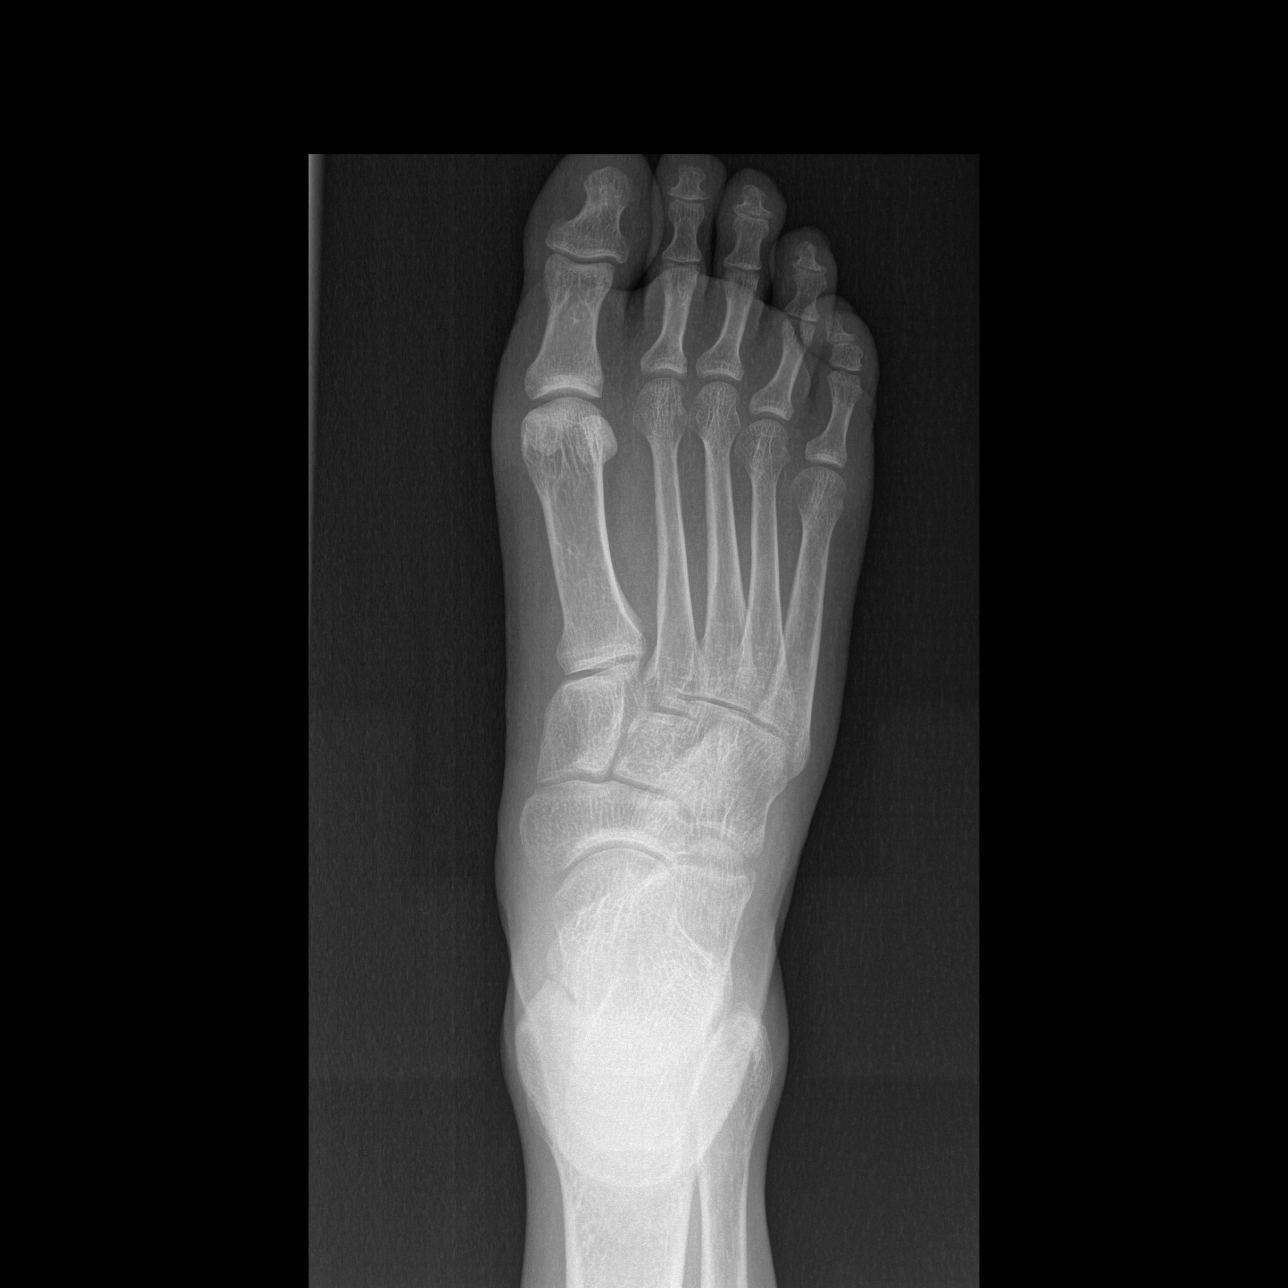

[t foot oblique right]
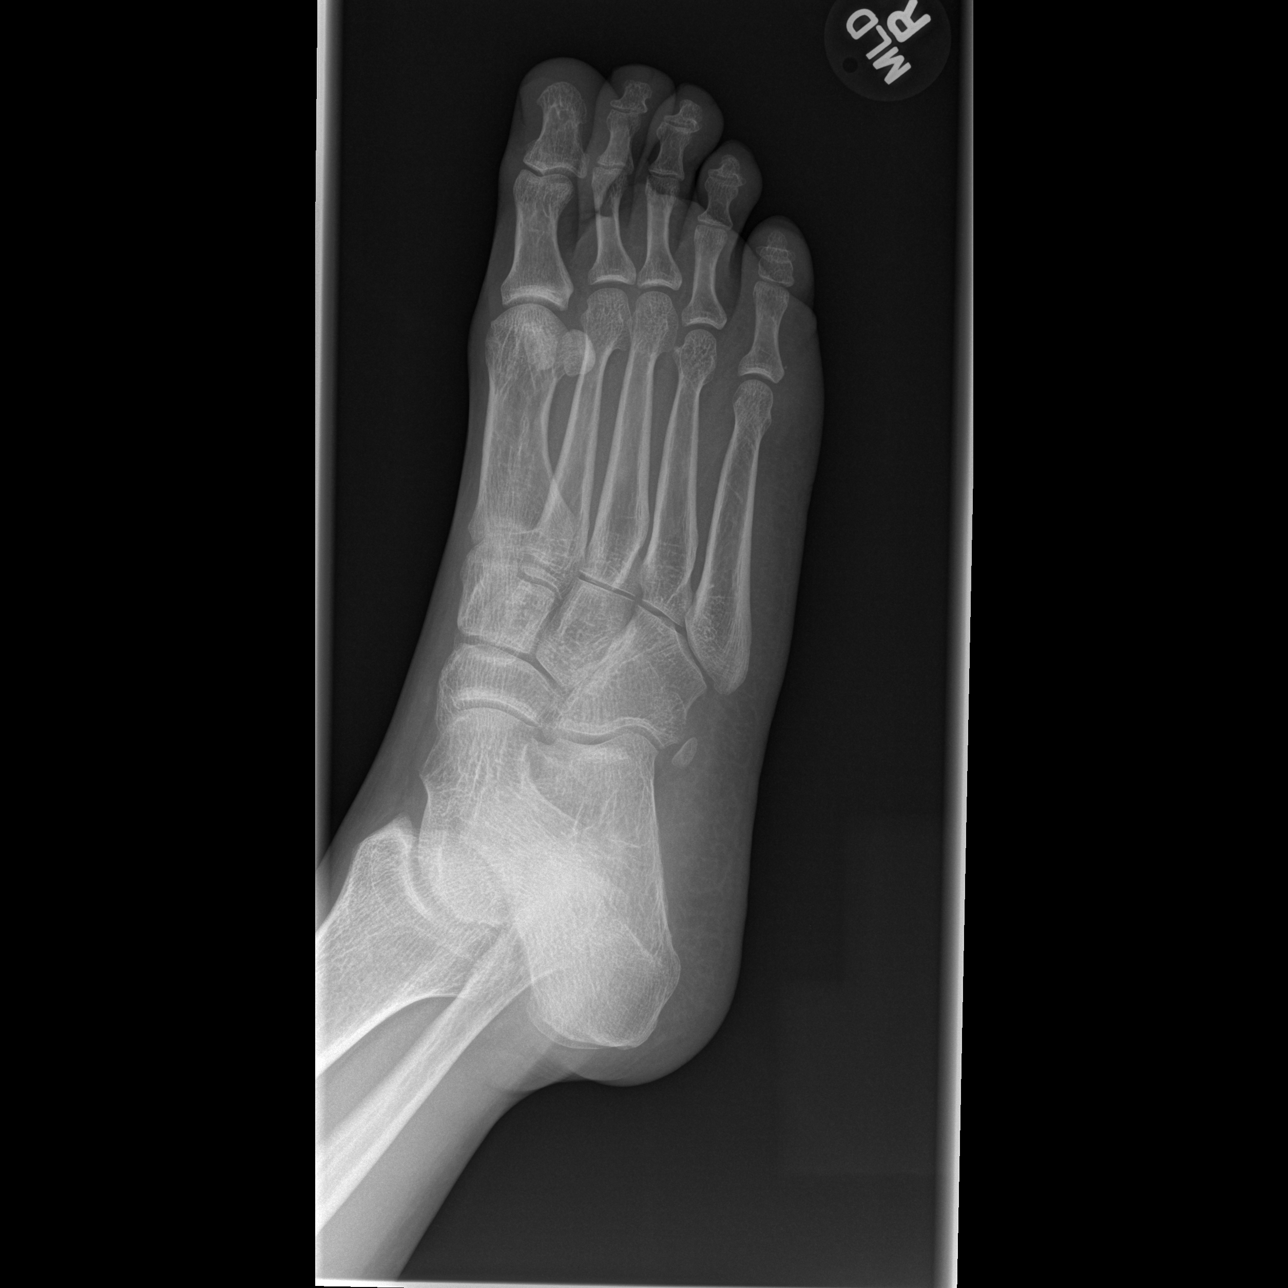

[t foot lat right]
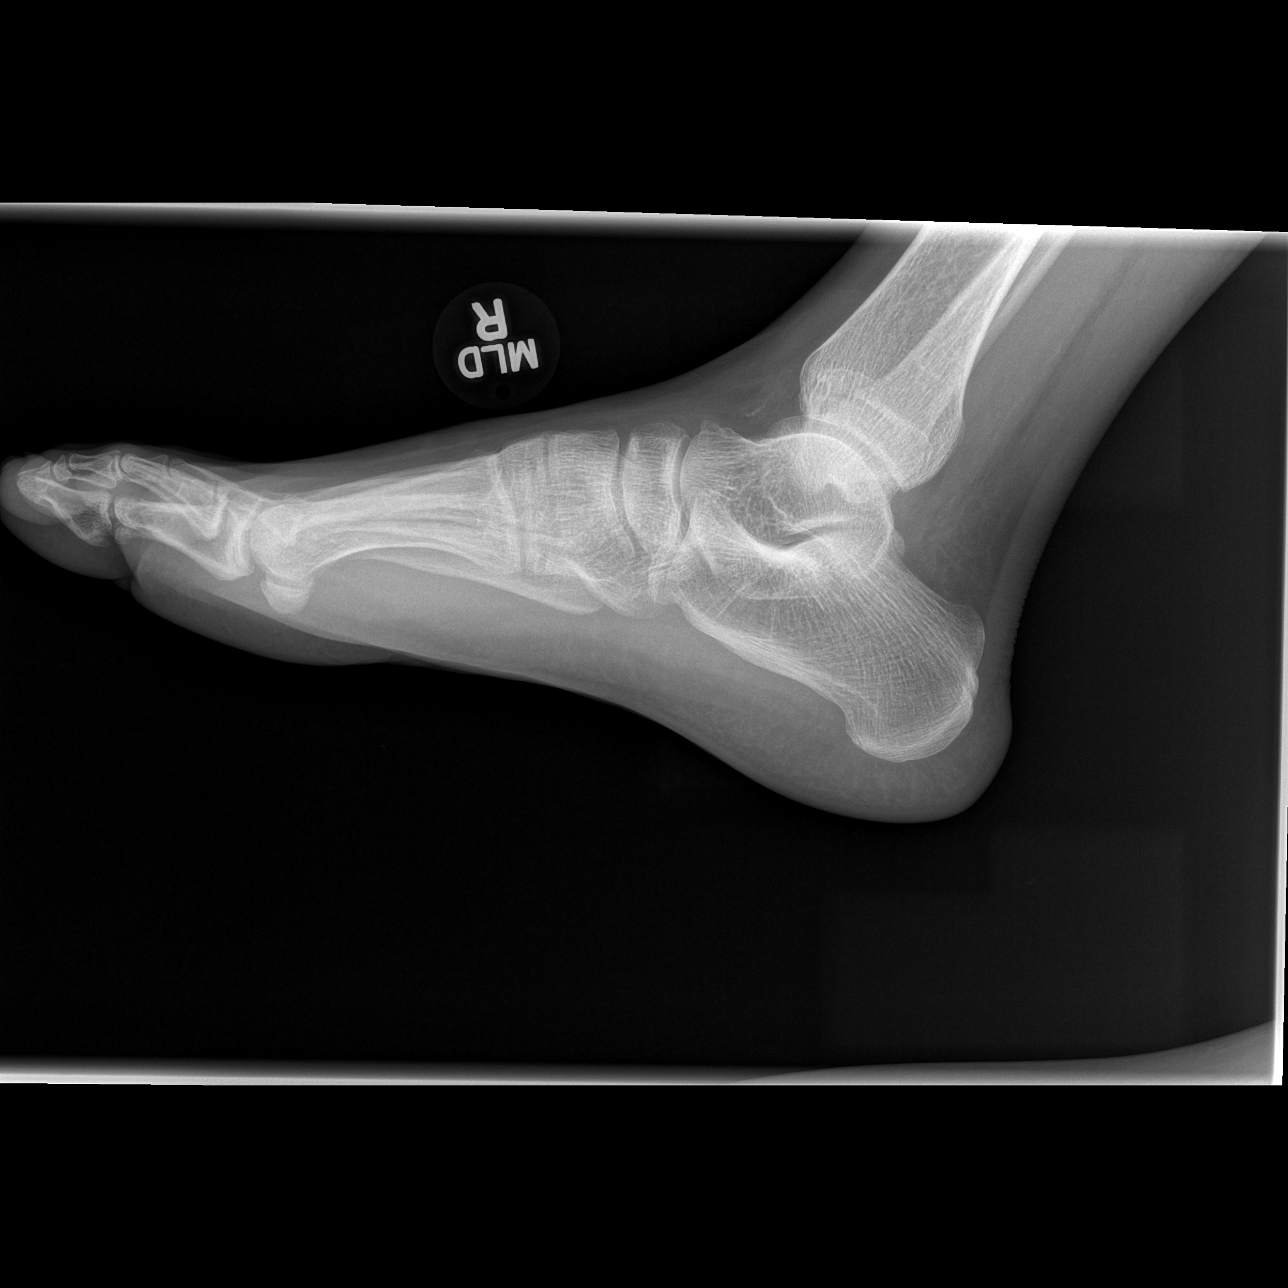

[3 of 3 positions shown; findings below may reference images not displayed]

FINDINGS: Small amount of linear calcific density anterior to the
ankle joint, suggesting vascular calcification.  No fracture,
dislocation or effusion seen.
IMPRESSION: No acute abnormality.

## 2011-08-31 ENCOUNTER — Encounter: Payer: Self-pay | Admitting: Emergency Medicine

## 2011-08-31 ENCOUNTER — Ambulatory Visit (INDEPENDENT_AMBULATORY_CARE_PROVIDER_SITE_OTHER): Payer: 59 | Admitting: Emergency Medicine

## 2011-08-31 VITALS — BP 150/100 | HR 111 | Temp 98.6°F | Ht 67.5 in | Wt 162.0 lb

## 2011-08-31 DIAGNOSIS — D869 Sarcoidosis, unspecified: Secondary | ICD-10-CM

## 2011-08-31 NOTE — Progress Notes (Signed)
Subjective:    Patient ID: [name], male    DOB: 08/23/1965, 46 y.o.   MRN: [id number]  HPI 46 yo man, never smoker, hx sarcoidosis dx by transbronchial bx's in 2002 and again in 2011 or 2012, also OSA on CPAP. He has occasional flares that sound asthmatic in nature, gets treated with prednisone. He flares typically a few times a year, but he has also gone for over a year without an exacerbation. He has never been on everyday meds, has been on pred with exacerbations - longest he was on it was 90 days. He has also been on a steroid-sparing agent before (?MTX), but it was stopped. He has a large cavity in R lung, scattered infiltrates. He has been on Advair before, Spiriva before, not currently. Has albuterol available prn, uses almost every day. Last PFT were last year at [facility].    Review of Systems  Constitutional: Positive for diaphoresis, activity change, appetite change and unexpected weight change. Negative for fever, chills and fatigue.  HENT: Positive for nosebleeds, congestion, trouble swallowing, postnasal drip and sinus pressure. Negative for hearing loss, ear pain, sore throat, rhinorrhea, sneezing, mouth sores, voice change and tinnitus.   Eyes: Positive for visual disturbance. Negative for photophobia.  Respiratory: Positive for cough, choking, chest tightness, shortness of breath and wheezing.   Cardiovascular: Negative for chest pain and palpitations.  Gastrointestinal: Positive for nausea. Negative for vomiting, abdominal pain and constipation.  Genitourinary: Negative for difficulty urinating.  Musculoskeletal: Negative for back pain, joint swelling and gait problem.  Skin: Negative for rash.  Neurological: Positive for weakness and light-headedness. Negative for dizziness, tremors, syncope, speech difficulty, numbness and headaches.  Hematological: Bruises/bleeds easily.  Psychiatric/Behavioral: Positive for confusion, disturbed wake/sleep cycle and agitation. The patient  is nervous/anxious.    Past Medical History  Diagnosis Date  . Hypertension   . Sarcoidosis   . OSA on CPAP   . Chronic respiratory failure     hom oxygen PRN  . Bronchitis   . Pneumonia      Family History  Problem Relation Age of Onset  . Diabetes Father      History   Social History  . Marital Status: Divorced    Spouse Name: N/A    Number of Children: 2  . Years of Education: N/A   Occupational History  .     Social History Main Topics  . Smoking status: Never Smoker   . Smokeless tobacco: Never Used  . Alcohol Use: Yes     social  . Drug Use: No  . Sexually Active: Not on file   Other Topics Concern  . Not on file   Social History Narrative  . No narrative on file     Allergies  Allergen Reactions  . Clindamycin/Lincomycin Swelling     Outpatient Prescriptions Prior to Visit  Medication Sig Dispense Refill  . albuterol (PROVENTIL HFA;VENTOLIN HFA) 108 (90 BASE) MCG/ACT inhaler Inhale 2 puffs into the lungs every 6 (six) hours as needed for wheezing or shortness of breath.  1 Inhaler  1  . albuterol (PROVENTIL) (2.5 MG/3ML) 0.083% nebulizer solution Take 3 mLs (2.5 mg total) by nebulization every 6 (six) hours as needed. For shortness of breath  75 mL  0  . amitriptyline (ELAVIL) 25 MG tablet Take 25 mg by mouth at bedtime as needed. For sleep      . Calcium Carbonate-Vitamin D (CALCIUM + D PO) Take 1 tablet by mouth daily.      Marland Kitchen  chlorthalidone (HYGROTON) 25 MG tablet Take 25 mg by mouth daily.      Marland Kitchen Cod Liver Oil CAPS Take 1 capsule by mouth daily.      . cyclobenzaprine (FLEXERIL) 10 MG tablet Take 10 mg by mouth 3 (three) times daily as needed. Muscle spasms.      . Dextromethorphan-Guaifenesin (TUSSIN DM) 10-100 MG/5ML liquid Take 10 mLs by mouth every 4 (four) hours as needed. For cough      . guaiFENesin (MUCINEX) 600 MG 12 hr tablet Take 1,200 mg by mouth 2 (two) times daily.      Marland Kitchen lidocaine (LIDODERM) 5 % Place 1 patch onto the skin daily.  Remove & Discard patch within 12 hours or as directed by MD      . Multiple Vitamins-Minerals (MULTIVITAMIN WITH MINERALS) tablet Take 1 tablet by mouth daily.      Marland Kitchen omeprazole (PRILOSEC) 20 MG capsule Take 20 mg by mouth daily.      . pregabalin (LYRICA) 200 MG capsule Take 200 mg by mouth 2 (two) times daily.      Marland Kitchen PRESCRIPTION MEDICATION Take 1 tablet by mouth daily. Pt takes a blood pressure medication, however pt doesn't know what this is called. Called all of his pharmacy's that is listed and cant locate rx      . traZODone (DESYREL) 100 MG tablet Take 100 mg by mouth at bedtime as needed. sleep      . vardenafil (LEVITRA) 10 MG tablet Take 10 mg by mouth as needed.      Marland Kitchen azithromycin (ZITHROMAX) 250 MG tablet Take 1 tablet (250 mg total) by mouth daily. For 4 more days  4 each  0  . predniSONE (DELTASONE) 50 MG tablet Take 1 tablet (50 mg total) by mouth daily. For days  5 tablet  0         Objective:   Physical Exam  Gen: Pleasant, well-nourished, in no distress,  normal affect  ENT: No lesions,  mouth clear,  oropharynx clear, no postnasal drip  Neck: No JVD, no TMG, no carotid bruits  Lungs: No use of accessory muscles, distant, no wheezes or crackles  Cardiovascular: RRR, heart sounds normal, no murmur or gallops, no peripheral edema  Musculoskeletal: No deformities, no cyanosis or clubbing  Neuro: alert, non focal  Skin: Warm, no lesions or rashes     Assessment & Plan:  Sarcoid Will obtain prior PFT and CT scans to review, last CT scan here was 04/2011 Manage with albuterol alone for now - may benefit from ICS to prevent flares Discussed eye exams annually  rov 4 mon

## 2011-08-31 NOTE — Patient Instructions (Addendum)
We will get your notes and test results from [facility] Continue your albuterol as needed We will discuss possibly adding other inhalers in the future Follow with Dr Delton Coombes in 4 months or sooner if you have any problems.

## 2011-08-31 NOTE — Assessment & Plan Note (Signed)
Will obtain prior PFT and CT scans to review, last CT scan here was 04/2011 Manage with albuterol alone for now - may benefit from ICS to prevent flares Discussed eye exams annually  rov 4 mon

## 2011-09-12 ENCOUNTER — Emergency Department (HOSPITAL_COMMUNITY): Payer: 59

## 2011-09-12 ENCOUNTER — Emergency Department (HOSPITAL_COMMUNITY)
Admission: EM | Admit: 2011-09-12 | Discharge: 2011-09-12 | Disposition: A | Discharge status: Home / Self Care | Payer: 59 | Attending: Emergency Medicine | Admitting: Emergency Medicine

## 2011-09-12 ENCOUNTER — Encounter (HOSPITAL_COMMUNITY): Payer: Self-pay | Admitting: Emergency Medicine

## 2011-09-12 DIAGNOSIS — G4733 Obstructive sleep apnea (adult) (pediatric): Secondary | ICD-10-CM | POA: Insufficient documentation

## 2011-09-12 DIAGNOSIS — L02519 Cutaneous abscess of unspecified hand: Secondary | ICD-10-CM | POA: Insufficient documentation

## 2011-09-12 DIAGNOSIS — Z9981 Dependence on supplemental oxygen: Secondary | ICD-10-CM | POA: Insufficient documentation

## 2011-09-12 DIAGNOSIS — L03119 Cellulitis of unspecified part of limb: Secondary | ICD-10-CM | POA: Insufficient documentation

## 2011-09-12 DIAGNOSIS — Z79899 Other long term (current) drug therapy: Secondary | ICD-10-CM | POA: Insufficient documentation

## 2011-09-12 DIAGNOSIS — I10 Essential (primary) hypertension: Secondary | ICD-10-CM | POA: Insufficient documentation

## 2011-09-12 DIAGNOSIS — L0390 Cellulitis, unspecified: Secondary | ICD-10-CM

## 2011-09-12 LAB — CBC WITH DIFFERENTIAL/PLATELET
Basophils Absolute: 0 10*3/uL (ref 0.0–0.1)
Basophils Relative: 0 % (ref 0–1)
Eosinophils Absolute: 0.6 10*3/uL (ref 0.0–0.7)
Lymphs Abs: 0.6 10*3/uL — ABNORMAL LOW (ref 0.7–4.0)
MCH: 25.9 pg — ABNORMAL LOW (ref 26.0–34.0)
Neutrophils Relative %: 70 % (ref 43–77)
Platelets: 348 10*3/uL (ref 150–400)
RBC: 5.01 MIL/uL (ref 4.22–5.81)
RDW: 13.6 % (ref 11.5–15.5)

## 2011-09-12 LAB — POCT I-STAT, CHEM 8
Creatinine, Ser: 0.9 mg/dL (ref 0.50–1.35)
HCT: 42 % (ref 39.0–52.0)
Hemoglobin: 14.3 g/dL (ref 13.0–17.0)
Potassium: 4.7 mEq/L (ref 3.5–5.1)
Sodium: 139 mEq/L (ref 135–145)
TCO2: 27 mmol/L (ref 0–100)

## 2011-09-12 MED ORDER — OXYCODONE-ACETAMINOPHEN 5-325 MG PO TABS
2.0000 | ORAL_TABLET | ORAL | Status: AC | PRN
Start: 2011-09-12 — End: 2011-09-22

## 2011-09-12 MED ORDER — IBUPROFEN 800 MG PO TABS: 800.0000 mg | ORAL_TABLET | Freq: Three times a day (TID) | ORAL | Status: AC

## 2011-09-12 MED ORDER — SODIUM CHLORIDE 0.9 % IV SOLN
3.0000 g | Freq: Once | INTRAVENOUS | Status: AC
  Administered 2011-09-12: 3 g via INTRAVENOUS
  Filled 2011-09-12: qty 3

## 2011-09-12 MED ORDER — SULFAMETHOXAZOLE-TRIMETHOPRIM 800-160 MG PO TABS: 1.0000 | ORAL_TABLET | Freq: Two times a day (BID) | ORAL | Status: AC

## 2011-09-12 MED ORDER — SODIUM CHLORIDE 0.9 % IV SOLN
Freq: Once | INTRAVENOUS | Status: AC
  Administered 2011-09-12: 16:00:00 via INTRAVENOUS

## 2011-09-12 MED ORDER — IBUPROFEN 800 MG PO TABS
800.0000 mg | ORAL_TABLET | Freq: Once | ORAL | Status: AC
  Administered 2011-09-12: 800 mg via ORAL
  Filled 2011-09-12: qty 1

## 2011-09-12 NOTE — ED Notes (Signed)
PA Crawford at bedside. 

## 2011-09-12 NOTE — ED Notes (Signed)
Pt states went to "pluck" something of the tip of his left long finger on Friday, felt immediate painful sensation in knuckle and same finger. Left hand is swollen, painful, with limited ROM

## 2011-09-12 NOTE — ED Provider Notes (Signed)
History     CSN: [id number]  Arrival date & time 09/12/11  1136   First MD Initiated Contact with Patient 09/12/11 1507      Chief Complaint  Patient presents with  . Hand Pain    (Consider location/radiation/quality/duration/timing/severity/associated sxs/prior treatment) Patient is a 46 y.o. male presenting with hand pain. The history is provided by the patient. No language interpreter was used.  Hand Pain This is a new problem. The current episode started in the past 7 days. The problem occurs constantly. The problem has been gradually worsening. Pertinent negatives include no chills, fever, joint swelling, nausea, vomiting or weakness. Exacerbated by: palpatation  He has tried nothing for the symptoms.   46 year old male coming in with left hand pain since Friday. The pain is swollen, painful, red and warm to touch. No fluctuance noted. Patient has good flexor and extensor tendon. States that Friday he tore something on his 3rd  Finger tip and when he tried to get up the finger tip is when the pain began. He said that the substance was sticking. Questioning if this is some kind of insect bite. Patient is afebrile denies nausea and vomiting. Patient is allergic to clindamycin. Woods lamp and followup with me in order to let the swelling. Past medical history of hypertension and sarcoidosis respiratory failure and pneumonia. Patient is on home O2.    Past Medical History  Diagnosis Date  . Hypertension   . Sarcoidosis   . OSA on CPAP   . Chronic respiratory failure     hom oxygen PRN  . Bronchitis   . Pneumonia     Past Surgical History  Procedure Date  . Rotator cuff repair   . Arm surgery      Family History  Problem Relation Age of Onset  . Diabetes Father     History  Substance Use Topics  . Smoking status: Never Smoker   . Smokeless tobacco: Never Used  . Alcohol Use: Yes     social      Review of Systems  Constitutional: Negative.  Negative for fever and  chills.  HENT: Negative.   Eyes: Negative.   Respiratory: Negative.        Home O2 prn wearing 2L now pmh sarcodosis  Cardiovascular: Negative.   Gastrointestinal: Negative.  Negative for nausea and vomiting.  Musculoskeletal: Negative for joint swelling.  Skin:       L hand tender and swollen  Neurological: Negative.  Negative for weakness.  Psychiatric/Behavioral: Negative.   All other systems reviewed and are negative.    Allergies  Clindamycin/lincomycin  Home Medications   Current Outpatient Rx  Name Route Sig Dispense Refill  . ALBUTEROL SULFATE HFA 108 (90 BASE) MCG/ACT IN AERS Inhalation Inhale 2 puffs into the lungs every 6 (six) hours as needed for wheezing or shortness of breath. 1 Inhaler 1  . ALBUTEROL SULFATE (2.5 MG/3ML) 0.083% IN NEBU Nebulization Take 3 mLs (2.5 mg total) by nebulization every 6 (six) hours as needed. For shortness of breath 75 mL 0  . AMITRIPTYLINE HCL 25 MG PO TABS Oral Take 25 mg by mouth at bedtime as needed. For sleep    . CALCIUM + D PO Oral Take 1 tablet by mouth daily.    . CHLORTHALIDONE 25 MG PO TABS Oral Take 25 mg by mouth daily.    . COD LIVER OIL PO CAPS Oral Take 1 capsule by mouth daily.    . CYCLOBENZAPRINE HCL 10 MG PO  TABS Oral Take 10 mg by mouth 3 (three) times daily as needed. Muscle spasms.    . DEXTROMETHORPHAN-GUAIFENESIN 10-100 MG/5ML PO SYRP Oral Take 10 mLs by mouth every 4 (four) hours as needed. For cough    . GUAIFENESIN ER 600 MG PO TB12 Oral Take 600-1,200 mg by mouth 2 (two) times daily as needed. For congestion.    Marland Kitchen LIDOCAINE 5 % EX PTCH Transdermal Place 1 patch onto the skin daily. Remove & Discard patch within 12 hours or as directed by MD    . MULTI-VITAMIN/MINERALS PO TABS Oral Take 1 tablet by mouth daily.    Marland Kitchen OMEPRAZOLE 20 MG PO CPDR Oral Take 20 mg by mouth daily.    Marland Kitchen PREGABALIN 200 MG PO CAPS Oral Take 200 mg by mouth 2 (two) times daily.    . TRAZODONE HCL 100 MG PO TABS Oral Take 100 mg by mouth at  bedtime as needed. sleep    . VARDENAFIL HCL 10 MG PO TABS Oral Take 10 mg by mouth as needed.      BP 130/102  Pulse 88  Temp 98.2 F (36.8 C) (Oral)  SpO2 96%  Physical Exam  Nursing note and vitals reviewed. Constitutional: He is oriented to person, place, and time. He appears well-developed and well-nourished.  HENT:  Head: Normocephalic.  Eyes: Conjunctivae and EOM are normal. Pupils are equal, round, and reactive to light.  Neck: Normal range of motion. Neck supple.  Cardiovascular: Normal rate.   Pulmonary/Chest: Effort normal.  Abdominal: Soft.  Musculoskeletal: Normal range of motion.  Neurological: He is alert and oriented to person, place, and time.  Skin: Skin is warm and dry.       Left hand tenderness, swelling, warm to touch. 2+ radial pulse. Good flexor and extensor tendon to the fingers.  Psychiatric: He has a normal mood and affect.    ED Course  Procedures (including critical care time) Spoke with hand surgeon who suggests that he follow up at [facility] facility incase hand is worse and he will see him there if called.  Volar splint applied as suggested.  Will give po antibiotics as well.     Labs Reviewed  CBC WITH DIFFERENTIAL - Abnormal; Notable for the following:    MCH 25.9 (*)     Lymphocytes Relative 11 (*)     Lymphs Abs 0.6 (*)     Eosinophils Relative 12 (*)     All other components within normal limits  POCT I-STAT, CHEM 8 - Abnormal; Notable for the following:    Glucose, Bld 203 (*)     All other components within normal limits   Dg Hand Complete Left  09/12/2011  *RADIOLOGY REPORT*  Clinical Data: Left hand pain and swelling.  No known injury.  LEFT HAND - COMPLETE 3+ VIEW  Comparison: None.  Findings: Mild to moderate dorsal soft tissue swelling.  The bones have normal appearances.  No soft tissue gas.  IMPRESSION: Soft tissue swelling without underlying bony abnormality.   Original Report Authenticated By: Darrol Angel, M.D.      No  diagnosis found.    MDM  Left hand infection. Patient agrees to come back tomorrow for a recheck at [facility] where hand surgeon is on call.   Patient received IV Unasyn in the ER today. No tachycardia, wbc normal.  Nontoxic appearance.  rx for Bactrim ds.Patient is allergic to clindamycin. Patient will return sooner if you have nausea vomiting high fever or  loss of movement to the hand. He will need a hand followup on Tuesday. Patient ready for discharge.          Remi Haggard, NP 09/13/11 1337

## 2011-09-12 NOTE — ED Notes (Signed)
Pt reports he was picking up something on Friday and felt a sharp prick to top of middle finger on left hand. Pt has swelling and redness noted to hand. Pt has been applying ice. Pt denies history of similar events. Pt denies fevers.

## 2011-09-12 NOTE — ED Notes (Signed)
Ortho tech at bedside for splint application

## 2011-09-15 NOTE — ED Provider Notes (Signed)
Medical screening examination/treatment/procedure(s) were performed by non-physician practitioner and as supervising physician I was immediately available for consultation/collaboration.  Ahonesty Woodfin T Neziah Vogelgesang, MD 09/15/11 2056 

## 2011-12-16 ENCOUNTER — Inpatient Hospital Stay (HOSPITAL_COMMUNITY)
Admission: EM | Admit: 2011-12-16 | Discharge: 2011-12-17 | DRG: 202 | Disposition: A | Discharge status: Home / Self Care | Payer: 59 | Attending: Internal Medicine | Admitting: Internal Medicine

## 2011-12-16 ENCOUNTER — Encounter (HOSPITAL_COMMUNITY): Payer: Self-pay | Admitting: *Deleted

## 2011-12-16 ENCOUNTER — Emergency Department (HOSPITAL_COMMUNITY): Payer: 59

## 2011-12-16 DIAGNOSIS — D869 Sarcoidosis, unspecified: Secondary | ICD-10-CM | POA: Diagnosis present

## 2011-12-16 DIAGNOSIS — R739 Hyperglycemia, unspecified: Secondary | ICD-10-CM

## 2011-12-16 DIAGNOSIS — Z8709 Personal history of other diseases of the respiratory system: Secondary | ICD-10-CM

## 2011-12-16 DIAGNOSIS — J9610 Chronic respiratory failure, unspecified whether with hypoxia or hypercapnia: Secondary | ICD-10-CM | POA: Diagnosis present

## 2011-12-16 DIAGNOSIS — J209 Acute bronchitis, unspecified: Principal | ICD-10-CM | POA: Diagnosis present

## 2011-12-16 DIAGNOSIS — R000 Tachycardia, unspecified: Secondary | ICD-10-CM | POA: Diagnosis present

## 2011-12-16 DIAGNOSIS — G4733 Obstructive sleep apnea (adult) (pediatric): Secondary | ICD-10-CM | POA: Diagnosis present

## 2011-12-16 DIAGNOSIS — J40 Bronchitis, not specified as acute or chronic: Secondary | ICD-10-CM

## 2011-12-16 DIAGNOSIS — Z8701 Personal history of pneumonia (recurrent): Secondary | ICD-10-CM

## 2011-12-16 DIAGNOSIS — J441 Chronic obstructive pulmonary disease with (acute) exacerbation: Secondary | ICD-10-CM

## 2011-12-16 DIAGNOSIS — Z9981 Dependence on supplemental oxygen: Secondary | ICD-10-CM

## 2011-12-16 DIAGNOSIS — J189 Pneumonia, unspecified organism: Secondary | ICD-10-CM

## 2011-12-16 DIAGNOSIS — Z9989 Dependence on other enabling machines and devices: Secondary | ICD-10-CM | POA: Diagnosis present

## 2011-12-16 LAB — POCT I-STAT, CHEM 8
Chloride: 100 mEq/L (ref 96–112)
Creatinine, Ser: 1.1 mg/dL (ref 0.50–1.35)
Glucose, Bld: 78 mg/dL (ref 70–99)
HCT: 49 % (ref 39.0–52.0)
Potassium: 3.6 mEq/L (ref 3.5–5.1)
Sodium: 137 mEq/L (ref 135–145)

## 2011-12-16 LAB — CBC WITH DIFFERENTIAL/PLATELET
Basophils Absolute: 0 K/uL (ref 0.0–0.1)
Basophils Relative: 0 % (ref 0–1)
Eosinophils Absolute: 0.4 K/uL (ref 0.0–0.7)
Eosinophils Relative: 5 % (ref 0–5)
HCT: 44 % (ref 39.0–52.0)
Hemoglobin: 14.3 g/dL (ref 13.0–17.0)
Lymphocytes Relative: 11 % — ABNORMAL LOW (ref 12–46)
Lymphs Abs: 0.8 K/uL (ref 0.7–4.0)
MCH: 25.5 pg — ABNORMAL LOW (ref 26.0–34.0)
MCHC: 32.5 g/dL (ref 30.0–36.0)
MCV: 78.4 fL (ref 78.0–100.0)
Monocytes Absolute: 0.4 K/uL (ref 0.1–1.0)
Monocytes Relative: 5 % (ref 3–12)
Neutro Abs: 6.3 K/uL (ref 1.7–7.7)
Neutrophils Relative %: 80 % — ABNORMAL HIGH (ref 43–77)
Platelets: 449 K/uL — ABNORMAL HIGH (ref 150–400)
RBC: 5.61 MIL/uL (ref 4.22–5.81)
RDW: 13.9 % (ref 11.5–15.5)
WBC: 7.9 K/uL (ref 4.0–10.5)

## 2011-12-16 MED ORDER — ALBUTEROL SULFATE (5 MG/ML) 0.5% IN NEBU
5.0000 mg | INHALATION_SOLUTION | Freq: Once | RESPIRATORY_TRACT | Status: AC
  Administered 2011-12-16: 5 mg via RESPIRATORY_TRACT
  Filled 2011-12-16: qty 1

## 2011-12-16 MED ORDER — LEVOFLOXACIN IN D5W 750 MG/150ML IV SOLN
750.0000 mg | INTRAVENOUS | Status: DC
  Administered 2011-12-16: 750 mg via INTRAVENOUS
  Filled 2011-12-16 (×2): qty 150

## 2011-12-16 MED ORDER — METHYLPREDNISOLONE SODIUM SUCC 125 MG IJ SOLR
125.0000 mg | Freq: Once | INTRAMUSCULAR | Status: AC
  Administered 2011-12-16: 125 mg via INTRAVENOUS
  Filled 2011-12-16: qty 2

## 2011-12-16 MED ORDER — ALBUTEROL SULFATE (5 MG/ML) 0.5% IN NEBU
INHALATION_SOLUTION | RESPIRATORY_TRACT | Status: AC
  Filled 2011-12-16: qty 1

## 2011-12-16 MED ORDER — IPRATROPIUM BROMIDE 0.02 % IN SOLN
0.5000 mg | Freq: Once | RESPIRATORY_TRACT | Status: AC
  Administered 2011-12-16: 0.5 mg via RESPIRATORY_TRACT
  Filled 2011-12-16: qty 2.5

## 2011-12-16 MED ORDER — SODIUM CHLORIDE 0.9 % IV BOLUS (SEPSIS)
1000.0000 mL | Freq: Once | INTRAVENOUS | Status: AC
  Administered 2011-12-16: 1000 mL via INTRAVENOUS

## 2011-12-16 MED ORDER — ALBUTEROL SULFATE (5 MG/ML) 0.5% IN NEBU
5.0000 mg | INHALATION_SOLUTION | Freq: Once | RESPIRATORY_TRACT | Status: AC
  Administered 2011-12-16: 5 mg via RESPIRATORY_TRACT

## 2011-12-16 NOTE — H&P (Addendum)
[name] is an 47 y.o. male.   Patient was seen and examined on November 16, 2011. PCP - in [facility]. Chief Complaint: Shortness of breath. HPI:46 year old male with history of sarcoidosis presents with increasing shortness of breath and productive cough for last 2 days. Denies any fever chills chest pain. In the ER chest x-rays were negative for any new infiltrates and on exam patient was found to be wheezing. At this time patient will be admitted for acute bronchitis.  Past Medical History  Diagnosis Date  . Hypertension   . Sarcoidosis   . OSA on CPAP   . Chronic respiratory failure     hom oxygen PRN  . Bronchitis   . Pneumonia     Past Surgical History  Procedure Date  . Rotator cuff repair   . Arm surgery      Family History  Problem Relation Age of Onset  . Diabetes Father    Social History:  reports that he has never smoked. He has never used smokeless tobacco. He reports that he drinks alcohol. He reports that he does not use illicit drugs.  Allergies:  Allergies  Allergen Reactions  . Clindamycin/Lincomycin Swelling     (Not in a hospital admission)  Results for orders placed during the hospital encounter of 12/16/11 (from the past 48 hour(s))  POCT I-STAT, CHEM 8     Status: Abnormal   Collection Time   12/16/11  8:43 PM      Component Value Range Comment   Sodium 137  135 - 145 mEq/L    Potassium 3.6  3.5 - 5.1 mEq/L    Chloride 100  96 - 112 mEq/L    BUN 11  6 - 23 mg/dL    Creatinine, Ser 1.61  0.50 - 1.35 mg/dL    Glucose, Bld 78  70 - 99 mg/dL    Calcium, Ion 0.96 (*) 1.12 - 1.23 mmol/L    TCO2 28  0 - 100 mmol/L    Hemoglobin 16.7  13.0 - 17.0 g/dL    HCT 04.5  40.9 - 81.1 %   CBC WITH DIFFERENTIAL     Status: Abnormal   Collection Time   12/16/11  8:50 PM      Component Value Range Comment   WBC 7.9  4.0 - 10.5 K/uL    RBC 5.61  4.22 - 5.81 MIL/uL    Hemoglobin 14.3  13.0 - 17.0 g/dL    HCT 91.4  78.2 - 95.6 %    MCV 78.4  78.0 -  100.0 fL    MCH 25.5 (*) 26.0 - 34.0 pg    MCHC 32.5  30.0 - 36.0 g/dL    RDW 21.3  08.6 - 57.8 %    Platelets 449 (*) 150 - 400 K/uL    Neutrophils Relative 80 (*) 43 - 77 %    Neutro Abs 6.3  1.7 - 7.7 K/uL    Lymphocytes Relative 11 (*) 12 - 46 %    Lymphs Abs 0.8  0.7 - 4.0 K/uL    Monocytes Relative 5  3 - 12 %    Monocytes Absolute 0.4  0.1 - 1.0 K/uL    Eosinophils Relative 5  0 - 5 %    Eosinophils Absolute 0.4  0.0 - 0.7 K/uL    Basophils Relative 0  0 - 1 %    Basophils Absolute 0.0  0.0 - 0.1 K/uL    Dg Chest 2 View  12/16/2011  *  RADIOLOGY REPORT*  Clinical Data: Chest pain.  Short of breath.  CHEST - 2 VIEW  Comparison: 07/27/2011  Findings: Bullous lung disease and chronic patchy bilateral pulmonary infiltrates show no significant change compared to previous study.  No acute or superimposed areas of infiltrate are seen.  There is no evidence of pleural effusion.  Heart size and mediastinal contours are stable and within normal limits.  IMPRESSION: Stable chronic bilateral pulmonary infiltrates and bullous lung disease.  No acute findings.   Original Report Authenticated By: Myles Rosenthal, M.D.     Review of Systems  Constitutional: Negative.   HENT: Negative.   Eyes: Negative.   Respiratory: Positive for sputum production, shortness of breath and wheezing.   Cardiovascular: Negative.   Gastrointestinal: Negative.   Genitourinary: Negative.   Musculoskeletal: Negative.   Skin: Negative.   Neurological: Negative.   Endo/Heme/Allergies: Negative.   Psychiatric/Behavioral: Negative.     Blood pressure 139/95, pulse 115, temperature 97.9 F (36.6 C), temperature source Oral, resp. rate 18, SpO2 100.00%. Physical Exam  Constitutional: He is oriented to person, place, and time. He appears well-developed and well-nourished. No distress.  HENT:  Head: Normocephalic and atraumatic.  Right Ear: External ear normal.  Left Ear: External ear normal.  Nose: Nose normal.   Mouth/Throat: Oropharynx is clear and moist. No oropharyngeal exudate.  Eyes: Conjunctivae normal are normal. Pupils are equal, round, and reactive to light. Right eye exhibits no discharge. Left eye exhibits no discharge. No scleral icterus.  Neck: Normal range of motion. Neck supple.  Cardiovascular: Regular rhythm.        Tachycardia.  Respiratory: No respiratory distress. He has wheezes. He has no rales.  GI: Soft. Bowel sounds are normal. He exhibits no distension. There is no tenderness. There is no rebound.  Musculoskeletal: He exhibits no edema and no tenderness.  Neurological: He is alert and oriented to person, place, and time.       Moves all extremities.  Skin: Skin is warm and dry. He is not diaphoretic.     Assessment/Plan #1. Acute bronchitis - we will continue with IV Solu-Medrol Pulmicort and nebulizers. Since patient has some exertional symptoms we will check BNP also, troponin. Check flu PCR. #2. OSA on CPAP - continue CPAP for respiratory. #3. Sarcoidosis - chest x-ray findings are stable.  CODE STATUS - full code.  Eduard Clos. 12/16/2011, 11:46 PM

## 2011-12-16 NOTE — ED Provider Notes (Signed)
History     CSN: [id number]  Arrival date & time 12/16/11  1845   First MD Initiated Contact with Patient 12/16/11 1957      Chief Complaint  Patient presents with  . Shortness of Breath  . Chest Pain    (Consider location/radiation/quality/duration/timing/severity/associated sxs/prior treatment) Patient is a 46 y.o. male presenting with shortness of breath and chest pain. The history is provided by the patient.  Shortness of Breath  The current episode started today. Associated symptoms include chest pain, cough and shortness of breath.  Chest Pain Primary symptoms include fatigue, shortness of breath and cough. Pertinent negatives for primary symptoms include no abdominal pain, no nausea and no vomiting.  Pertinent negatives for associated symptoms include no numbness and no weakness.    patient has a history of sarcoidosis and his current lung problems. He states he's had shortness of breath and chest pain for last couple days. No fevers. He states he's had some production with his cough. States no relief with his inhaler at home. He's also had some dull chest pain. He states he's had admission for similar symptoms in the past. No clear sick contacts. No abdominal pain. He does feel fatigued.  Past Medical History  Diagnosis Date  . Hypertension   . Sarcoidosis   . OSA on CPAP   . Chronic respiratory failure     hom oxygen PRN  . Bronchitis   . Pneumonia     Past Surgical History  Procedure Date  . Rotator cuff repair   . Arm surgery      Family History  Problem Relation Age of Onset  . Diabetes Father     History  Substance Use Topics  . Smoking status: Never Smoker   . Smokeless tobacco: Never Used  . Alcohol Use: Yes     Comment: social      Review of Systems  Constitutional: Positive for fatigue. Negative for activity change and appetite change.  HENT: Negative for neck stiffness.   Eyes: Negative for pain.  Respiratory: Positive for cough and  shortness of breath. Negative for chest tightness.   Cardiovascular: Positive for chest pain. Negative for leg swelling.  Gastrointestinal: Negative for nausea, vomiting, abdominal pain and diarrhea.  Genitourinary: Negative for flank pain.  Musculoskeletal: Negative for back pain.  Skin: Negative for rash.  Neurological: Negative for weakness, numbness and headaches.  Psychiatric/Behavioral: Negative for behavioral problems.    Allergies  Clindamycin/lincomycin  Home Medications   Current Outpatient Rx  Name  Route  Sig  Dispense  Refill  . ALBUTEROL SULFATE HFA 108 (90 BASE) MCG/ACT IN AERS   Inhalation   Inhale 2 puffs into the lungs every 6 (six) hours as needed. Wheezing         . ALBUTEROL SULFATE (2.5 MG/3ML) 0.083% IN NEBU   Nebulization   Take 3 mLs (2.5 mg total) by nebulization every 6 (six) hours as needed. For shortness of breath   75 mL   0   . AMITRIPTYLINE HCL 25 MG PO TABS   Oral   Take 25 mg by mouth at bedtime as needed. For sleep         . CALCIUM + D PO   Oral   Take 1 tablet by mouth daily.         . CHLORTHALIDONE 25 MG PO TABS   Oral   Take 25 mg by mouth daily.         Marland Kitchen VITAMIN D 1000  UNITS PO TABS   Oral   Take 1,000 Units by mouth daily.         . COD LIVER OIL PO CAPS   Oral   Take 1 capsule by mouth daily.         . CYCLOBENZAPRINE HCL 10 MG PO TABS   Oral   Take 10 mg by mouth 3 (three) times daily as needed. Muscle spasms.         . DEXTROMETHORPHAN-GUAIFENESIN 10-100 MG/5ML PO SYRP   Oral   Take 10 mLs by mouth every 4 (four) hours as needed. For cough         . DIPHENHYDRAMINE HCL 25 MG PO TABS   Oral   Take 25 mg by mouth every 6 (six) hours as needed. Allergies         . GUAIFENESIN ER 600 MG PO TB12   Oral   Take 600-1,200 mg by mouth 2 (two) times daily as needed. For congestion.         Marland Kitchen LIDOCAINE 5 % EX PTCH   Transdermal   Place 1 patch onto the skin daily. Remove & Discard patch within 12  hours or as directed by MD         . MULTI-VITAMIN/MINERALS PO TABS   Oral   Take 1 tablet by mouth daily.         Marland Kitchen OMEPRAZOLE 20 MG PO CPDR   Oral   Take 20 mg by mouth daily.         Marland Kitchen PREGABALIN 200 MG PO CAPS   Oral   Take 200 mg by mouth 2 (two) times daily.         . TRAZODONE HCL 100 MG PO TABS   Oral   Take 100 mg by mouth at bedtime as needed. sleep         . VARDENAFIL HCL 10 MG PO TABS   Oral   Take 10 mg by mouth as needed. E.D.         . VITAMIN C 250 MG PO TABS   Oral   Take 250 mg by mouth daily.           BP 136/96  Pulse 119  Temp 97.9 F (36.6 C) (Oral)  Resp 13  SpO2 100%  Physical Exam  Nursing note and vitals reviewed. Constitutional: He is oriented to person, place, and time. He appears well-developed and well-nourished.  HENT:  Head: Normocephalic and atraumatic.  Eyes: EOM are normal. Pupils are equal, round, and reactive to light.  Neck: Normal range of motion. Neck supple.  Cardiovascular: Regular rhythm and normal heart sounds.   No murmur heard.      Tachycardia.  Pulmonary/Chest: Effort normal. He has wheezes.       Diffuse wheezes and harsh breath sounds.  Abdominal: Soft. Bowel sounds are normal. He exhibits no distension and no mass. There is no tenderness. There is no rebound and no guarding.  Musculoskeletal: Normal range of motion. He exhibits no edema.  Neurological: He is alert and oriented to person, place, and time. No cranial nerve deficit.  Skin: Skin is warm and dry.  Psychiatric: He has a normal mood and affect.    ED Course  Procedures (including critical care time)  Labs Reviewed  CBC WITH DIFFERENTIAL - Abnormal; Notable for the following:    MCH 25.5 (*)     Platelets 449 (*)     Neutrophils Relative 80 (*)  Lymphocytes Relative 11 (*)     All other components within normal limits  POCT I-STAT, CHEM 8 - Abnormal; Notable for the following:    Calcium, Ion 1.28 (*)     All other  components within normal limits   Dg Chest 2 View  12/16/2011  *RADIOLOGY REPORT*  Clinical Data: Chest pain.  Short of breath.  CHEST - 2 VIEW  Comparison: 07/27/2011  Findings: Bullous lung disease and chronic patchy bilateral pulmonary infiltrates show no significant change compared to previous study.  No acute or superimposed areas of infiltrate are seen.  There is no evidence of pleural effusion.  Heart size and mediastinal contours are stable and within normal limits.  IMPRESSION: Stable chronic bilateral pulmonary infiltrates and bullous lung disease.  No acute findings.   Original Report Authenticated By: Myles Rosenthal, M.D.      1. Acute bronchitis   2. OSA on CPAP   3. Sarcoid     Date: 12/17/2011  Rate: 118  Rhythm: sinus tachycardia  QRS Axis: normal  Intervals: normal  ST/T Wave abnormalities: normal  Conduction Disutrbances:none  Narrative Interpretation:   Old EKG Reviewed: unchanged    MDM  Patient with shortness of breath. Also cough. No relief with breathing treatments. Has a history of sarcoidosis and some chronic lung problems. He is on home oxygen. X-ray is chronic. Patient continues to be tachycardic and tachypnea. He will be admitted to medicine.        Juliet Rude. Rubin Payor, MD 12/17/11 8295

## 2011-12-16 NOTE — ED Notes (Signed)
Pt c/o SOB and chest pain x's 2-3 days. Reports hx of sarcoidosis in both lungs.

## 2011-12-17 LAB — PRO B NATRIURETIC PEPTIDE: Pro B Natriuretic peptide (BNP): 15.9 pg/mL (ref 0–125)

## 2011-12-17 LAB — CBC
MCH: 24.9 pg — ABNORMAL LOW (ref 26.0–34.0)
MCHC: 31.4 g/dL (ref 30.0–36.0)
MCV: 79.3 fL (ref 78.0–100.0)
Platelets: 362 10*3/uL (ref 150–400)

## 2011-12-17 LAB — CREATININE, SERUM: Creatinine, Ser: 1.02 mg/dL (ref 0.50–1.35)

## 2011-12-17 MED ORDER — BIOTENE DRY MOUTH MT LIQD
15.0000 mL | Freq: Two times a day (BID) | OROMUCOSAL | Status: DC
  Administered 2011-12-17: 15 mL via OROMUCOSAL

## 2011-12-17 MED ORDER — CHLORTHALIDONE 25 MG PO TABS
25.0000 mg | ORAL_TABLET | Freq: Every day | ORAL | Status: DC
  Administered 2011-12-17: 25 mg via ORAL
  Filled 2011-12-17 (×2): qty 1

## 2011-12-17 MED ORDER — AZITHROMYCIN 500 MG PO TABS: 500.0000 mg | ORAL_TABLET | Freq: Every day | ORAL | Status: DC

## 2011-12-17 MED ORDER — MULTI-VITAMIN/MINERALS PO TABS: 1.0000 | ORAL_TABLET | Freq: Every day | ORAL | Status: DC

## 2011-12-17 MED ORDER — ACETAMINOPHEN 650 MG RE SUPP: 650.0000 mg | Freq: Four times a day (QID) | RECTAL | Status: DC | PRN

## 2011-12-17 MED ORDER — ADULT MULTIVITAMIN W/MINERALS CH
1.0000 | ORAL_TABLET | Freq: Every day | ORAL | Status: DC
Start: 2011-12-17 — End: 2011-12-17
  Administered 2011-12-17: 1 via ORAL
  Filled 2011-12-17: qty 1

## 2011-12-17 MED ORDER — ONDANSETRON HCL 4 MG PO TABS: 4.0000 mg | ORAL_TABLET | Freq: Four times a day (QID) | ORAL | Status: DC | PRN

## 2011-12-17 MED ORDER — VITAMIN C 250 MG PO TABS
250.0000 mg | ORAL_TABLET | Freq: Every day | ORAL | Status: DC
  Administered 2011-12-17: 250 mg via ORAL
  Filled 2011-12-17 (×2): qty 1

## 2011-12-17 MED ORDER — AMITRIPTYLINE HCL 25 MG PO TABS
25.0000 mg | ORAL_TABLET | Freq: Every evening | ORAL | Status: DC | PRN
  Filled 2011-12-17: qty 1

## 2011-12-17 MED ORDER — DIPHENHYDRAMINE HCL 25 MG PO TABS
25.0000 mg | ORAL_TABLET | Freq: Four times a day (QID) | ORAL | Status: DC | PRN
  Filled 2011-12-17: qty 1

## 2011-12-17 MED ORDER — TRAZODONE HCL 100 MG PO TABS
100.0000 mg | ORAL_TABLET | Freq: Every evening | ORAL | Status: DC | PRN
  Filled 2011-12-17: qty 1

## 2011-12-17 MED ORDER — LEVALBUTEROL HCL 0.63 MG/3ML IN NEBU
0.6300 mg | INHALATION_SOLUTION | Freq: Four times a day (QID) | RESPIRATORY_TRACT | Status: DC
  Administered 2011-12-17 (×2): 0.63 mg via RESPIRATORY_TRACT
  Filled 2011-12-17 (×7): qty 3

## 2011-12-17 MED ORDER — BUDESONIDE 0.5 MG/2ML IN SUSP
0.5000 mg | Freq: Two times a day (BID) | RESPIRATORY_TRACT | Status: DC
  Administered 2011-12-17: 0.5 mg via RESPIRATORY_TRACT
  Filled 2011-12-17 (×3): qty 2

## 2011-12-17 MED ORDER — ACETAMINOPHEN 325 MG PO TABS
650.0000 mg | ORAL_TABLET | Freq: Four times a day (QID) | ORAL | Status: DC | PRN
  Administered 2011-12-17: 650 mg via ORAL
  Filled 2011-12-17: qty 2

## 2011-12-17 MED ORDER — GUAIFENESIN ER 600 MG PO TB12
600.0000 mg | ORAL_TABLET | Freq: Two times a day (BID) | ORAL | Status: DC | PRN
  Filled 2011-12-17: qty 2

## 2011-12-17 MED ORDER — GUAIFENESIN-DM 100-10 MG/5ML PO SYRP
10.0000 mL | ORAL_SOLUTION | ORAL | Status: DC | PRN
  Administered 2011-12-17: 10 mL via ORAL
  Filled 2011-12-17: qty 10

## 2011-12-17 MED ORDER — PANTOPRAZOLE SODIUM 40 MG PO TBEC
40.0000 mg | DELAYED_RELEASE_TABLET | Freq: Every day | ORAL | Status: DC
  Administered 2011-12-17: 40 mg via ORAL
  Filled 2011-12-17: qty 1

## 2011-12-17 MED ORDER — PREGABALIN 75 MG PO CAPS
200.0000 mg | ORAL_CAPSULE | Freq: Two times a day (BID) | ORAL | Status: DC
  Administered 2011-12-17 (×2): 200 mg via ORAL
  Filled 2011-12-17 (×3): qty 1

## 2011-12-17 MED ORDER — LEVALBUTEROL HCL 0.63 MG/3ML IN NEBU
0.6300 mg | INHALATION_SOLUTION | Freq: Four times a day (QID) | RESPIRATORY_TRACT | Status: DC | PRN
  Filled 2011-12-17: qty 3

## 2011-12-17 MED ORDER — PREDNISONE (PAK) 10 MG PO TABS: 10.0000 mg | ORAL_TABLET | Freq: Every day | ORAL | Status: DC

## 2011-12-17 MED ORDER — IPRATROPIUM BROMIDE 0.02 % IN SOLN
0.5000 mg | Freq: Four times a day (QID) | RESPIRATORY_TRACT | Status: DC
  Administered 2011-12-17 (×2): 0.5 mg via RESPIRATORY_TRACT
  Filled 2011-12-17 (×3): qty 2.5

## 2011-12-17 MED ORDER — LIDOCAINE 5 % EX PTCH
1.0000 | MEDICATED_PATCH | Freq: Every day | CUTANEOUS | Status: DC
  Administered 2011-12-17: 1 via TRANSDERMAL
  Filled 2011-12-17 (×2): qty 1

## 2011-12-17 MED ORDER — SODIUM CHLORIDE 0.9 % IJ SOLN
3.0000 mL | Freq: Two times a day (BID) | INTRAMUSCULAR | Status: DC
  Administered 2011-12-17: 3 mL via INTRAVENOUS

## 2011-12-17 MED ORDER — ENOXAPARIN SODIUM 40 MG/0.4ML ~~LOC~~ SOLN
40.0000 mg | SUBCUTANEOUS | Status: DC
  Administered 2011-12-17: 40 mg via SUBCUTANEOUS
  Filled 2011-12-17 (×2): qty 0.4

## 2011-12-17 MED ORDER — METHYLPREDNISOLONE SODIUM SUCC 40 MG IJ SOLR
40.0000 mg | INTRAMUSCULAR | Status: DC
  Administered 2011-12-17: 40 mg via INTRAVENOUS
  Filled 2011-12-17: qty 1

## 2011-12-17 MED ORDER — ONDANSETRON HCL 4 MG/2ML IJ SOLN: 4.0000 mg | Freq: Four times a day (QID) | INTRAMUSCULAR | Status: DC | PRN

## 2011-12-17 MED ORDER — CYCLOBENZAPRINE HCL 10 MG PO TABS
10.0000 mg | ORAL_TABLET | Freq: Three times a day (TID) | ORAL | Status: DC | PRN
  Filled 2011-12-17: qty 1

## 2011-12-17 MED ORDER — VITAMIN D3 25 MCG (1000 UNIT) PO TABS
1000.0000 [IU] | ORAL_TABLET | Freq: Every day | ORAL | Status: DC
  Administered 2011-12-17: 1000 [IU] via ORAL
  Filled 2011-12-17 (×2): qty 1

## 2011-12-17 MED ORDER — SODIUM CHLORIDE 0.9 % IJ SOLN: 3.0000 mL | Freq: Two times a day (BID) | INTRAMUSCULAR | Status: DC

## 2011-12-17 NOTE — Progress Notes (Signed)
Pt was taken to hospital entrance by the NT via wheelchair. Pt stated he would drive himself home, and declined having someone come and pick him up. Pt's breathing was not labored, pt did have intermittent coughing,  pt was steady on his feet and stated he felt capable, wasn't experiencing any dizziness or lightheadedness.

## 2011-12-17 NOTE — Care Management Note (Signed)
    Page 1 of 1   12/17/2011     12:12:26 PM   CARE MANAGEMENT NOTE 12/17/2011  Patient:  [name],[name]   Account Number:  [id number]  Date Initiated:  12/17/2011  Documentation initiated by:  Lanier Clam  Subjective/Objective Assessment:   ADMITTED W/SOB.ACUTE BRONCHITIS.     Action/Plan:   FROM HOME.HAS PCP,PHARMACY   Anticipated DC Date:  12/17/2011   Anticipated DC Plan:  HOME/SELF CARE      DC Planning Services  CM consult      Choice offered to / List presented to:             Status of service:  Completed, signed off Medicare Important Message given?   (If response is "NO", the following Medicare IM given date fields will be blank) Date Medicare IM given:   Date Additional Medicare IM given:    Discharge Disposition:  HOME/SELF CARE  Per UR Regulation:  Reviewed for med. necessity/level of care/duration of stay  If discussed at Long Length of Stay Meetings, dates discussed:    Comments:  12/17/11 [facility] RN,BSN NCM [phone]

## 2011-12-17 NOTE — Discharge Summary (Signed)
Physician Discharge Summary  Patient ID: [name] MRN: [id number] DOB/AGE: 1965-04-21 46 y.o.  Admit date: 12/16/2011 Discharge date: 12/17/2011  Primary Care Physician:  Provider Not In System   Discharge Diagnoses:    Principal Problem:  *Acute bronchitis Active Problems:  Sarcoid  OSA on CPAP      Medication List     As of 12/17/2011 11:41 AM    STOP taking these medications         albuterol (2.5 MG/3ML) 0.083% nebulizer solution   Commonly known as: PROVENTIL      TAKE these medications         albuterol 108 (90 BASE) MCG/ACT inhaler   Commonly known as: PROVENTIL HFA;VENTOLIN HFA   Inhale 2 puffs into the lungs every 6 (six) hours as needed. Wheezing      amitriptyline 25 MG tablet   Commonly known as: ELAVIL   Take 25 mg by mouth at bedtime as needed. For sleep      azithromycin 500 MG tablet   Commonly known as: ZITHROMAX   Take 1 tablet (500 mg total) by mouth daily.      CALCIUM + D PO   Take 1 tablet by mouth daily.      chlorthalidone 25 MG tablet   Commonly known as: HYGROTON   Take 25 mg by mouth daily.      cholecalciferol 1000 UNITS tablet   Commonly known as: VITAMIN D   Take 1,000 Units by mouth daily.      Cod Liver Oil Caps   Take 1 capsule by mouth daily.      cyclobenzaprine 10 MG tablet   Commonly known as: FLEXERIL   Take 10 mg by mouth 3 (three) times daily as needed. Muscle spasms.      diphenhydrAMINE 25 MG tablet   Commonly known as: BENADRYL   Take 25 mg by mouth every 6 (six) hours as needed. Allergies      guaiFENesin 600 MG 12 hr tablet   Commonly known as: MUCINEX   Take 600-1,200 mg by mouth 2 (two) times daily as needed. For congestion.      lidocaine 5 %   Commonly known as: LIDODERM   Place 1 patch onto the skin daily. Remove & Discard patch within 12 hours or as directed by MD      multivitamin with minerals tablet   Take 1 tablet by mouth daily.      omeprazole 20 MG capsule   Commonly known as:  PRILOSEC   Take 20 mg by mouth daily.      predniSONE 10 MG tablet   Commonly known as: STERAPRED UNI-PAK   Take 1 tablet (10 mg total) by mouth daily. 6 tablets daily and then decrease by 1 tablet daily until none are remaining.      pregabalin 200 MG capsule   Commonly known as: LYRICA   Take 200 mg by mouth 2 (two) times daily.      traZODone 100 MG tablet   Commonly known as: DESYREL   Take 100 mg by mouth at bedtime as needed. sleep      TUSSIN DM 10-100 MG/5ML liquid   Generic drug: Dextromethorphan-Guaifenesin   Take 10 mLs by mouth every 4 (four) hours as needed. For cough      vardenafil 10 MG tablet   Commonly known as: LEVITRA   Take 10 mg by mouth as needed. E.D.      vitamin C 250 MG tablet  Commonly known as: ASCORBIC ACID   Take 250 mg by mouth daily.          Disposition and Follow-up:  Will be discharged home today in stable and improved condition. Has been asked to follow up with his PCP in 2 weeks.  Consults:  None   Significant Diagnostic Studies:  Dg Chest 2 View  12/16/2011  *RADIOLOGY REPORT*  Clinical Data: Chest pain.  Short of breath.  CHEST - 2 VIEW  Comparison: 07/27/2011  Findings: Bullous lung disease and chronic patchy bilateral pulmonary infiltrates show no significant change compared to previous study.  No acute or superimposed areas of infiltrate are seen.  There is no evidence of pleural effusion.  Heart size and mediastinal contours are stable and within normal limits.  IMPRESSION: Stable chronic bilateral pulmonary infiltrates and bullous lung disease.  No acute findings.   Original Report Authenticated By: Myles Rosenthal, M.D.     Brief H and P: For complete details please refer to admission H and P, but in brief patient is a  46 year old male with history of sarcoidosis presents with increasing shortness of breath and productive cough for last 2 days. Denies any fever chills chest pain. In the ER chest x-rays were negative for any new  infiltrates and on exam patient was found to be wheezing. We were asked to admit him for further evaluation and management.     Hospital Course:  Principal Problem:  *Acute bronchitis Active Problems:  Sarcoid  OSA on CPAP   Acute Bronchitis -Doing well today. -Has ambulated without compaints. -Has no oxygen requirements. -Will be discharged on 5 days of azithromycin as well as a prednisone taper.  Rest of chronic medical issues have been stable this admission.  Time spent on Discharge: Greater than 30 minutes.  SignedChaya Jan [facility] Pager: [phone] 12/17/2011, 11:41 AM

## 2011-12-17 NOTE — Progress Notes (Signed)
ANTIBIOTIC CONSULT NOTE - INITIAL  Pharmacy Consult for Levaquin Indication: Bronchitis  Allergies  Allergen Reactions  . Clindamycin/Lincomycin Swelling    Patient Measurements: Height: 5' 7.5" (171.5 cm) Weight: 153 lb 7 oz (69.6 kg) IBW/kg (Calculated) : 67.25    Vital Signs: Temp: 98 F (36.7 C) (12/06 0406) Temp src: Oral (12/06 0406) BP: 147/90 mmHg (12/06 0406) Pulse Rate: 113  (12/06 0406) Intake/Output from previous day:   Intake/Output from this shift:    Labs:  Basename 12/16/11 2050 12/16/11 2043  WBC 7.9 --  HGB 14.3 16.7  PLT 449* --  LABCREA -- --  CREATININE -- 1.10   Estimated Creatinine Clearance: 79 ml/min (by C-G formula based on Cr of 1.1). No results found for this basename: VANCOTROUGH:2,VANCOPEAK:2,VANCORANDOM:2,GENTTROUGH:2,GENTPEAK:2,GENTRANDOM:2,TOBRATROUGH:2,TOBRAPEAK:2,TOBRARND:2,AMIKACINPEAK:2,AMIKACINTROU:2,AMIKACIN:2, in the last 72 hours   Microbiology: No results found for this or any previous visit (from the past 720 hour(s)).  Medical History: Past Medical History  Diagnosis Date  . Hypertension   . Sarcoidosis   . OSA on CPAP   . Chronic respiratory failure     hom oxygen PRN  . Bronchitis   . Pneumonia     Medications:  Scheduled:    . [COMPLETED] albuterol  5 mg Nebulization Once  . [COMPLETED] albuterol  5 mg Nebulization Once  . [COMPLETED] albuterol  5 mg Nebulization Once  . albuterol      . budesonide  0.5 mg Nebulization BID  . chlorthalidone  25 mg Oral Daily  . cholecalciferol  1,000 Units Oral Daily  . enoxaparin (LOVENOX) injection  40 mg Subcutaneous Q24H  . [COMPLETED] ipratropium  0.5 mg Nebulization Once  . ipratropium  0.5 mg Nebulization Q6H  . levalbuterol  0.63 mg Nebulization Q6H  . levofloxacin  750 mg Intravenous Q24H  . lidocaine  1 patch Transdermal QHS  . [COMPLETED] methylPREDNISolone (SOLU-MEDROL) injection  125 mg Intravenous Once  . methylPREDNISolone (SOLU-MEDROL) injection  40  mg Intravenous Q24H  . multivitamin with minerals  1 tablet Oral Daily  . pantoprazole  40 mg Oral Daily  . pregabalin  200 mg Oral BID  . [COMPLETED] sodium chloride  1,000 mL Intravenous Once  . sodium chloride  3 mL Intravenous Q12H  . sodium chloride  3 mL Intravenous Q12H  . vitamin C  250 mg Oral Daily  . [DISCONTINUED] multivitamin with minerals  1 tablet Oral Daily   Infusions:   Assessment: 46 yo c/o SOB and chest pain admitted with bronchitis.  MD ordering Levaquin per RX.  Goal of Therapy:   Treat infection  Plan:   Levaquin 750mg  IV q24h.  F/U SCr as needed.  Susanne Greenhouse R 12/17/2011,4:17 AM

## 2012-01-01 IMAGING — CR DG CHEST 2V
2 series · 2 of 2 positions shown · non-contrast
Comparison: CT chest of 01/15/2009

CLINICAL DATA: Chest pain, short of breath, sarcoidosis

CHEST - 2 VIEW

[w chest lat]
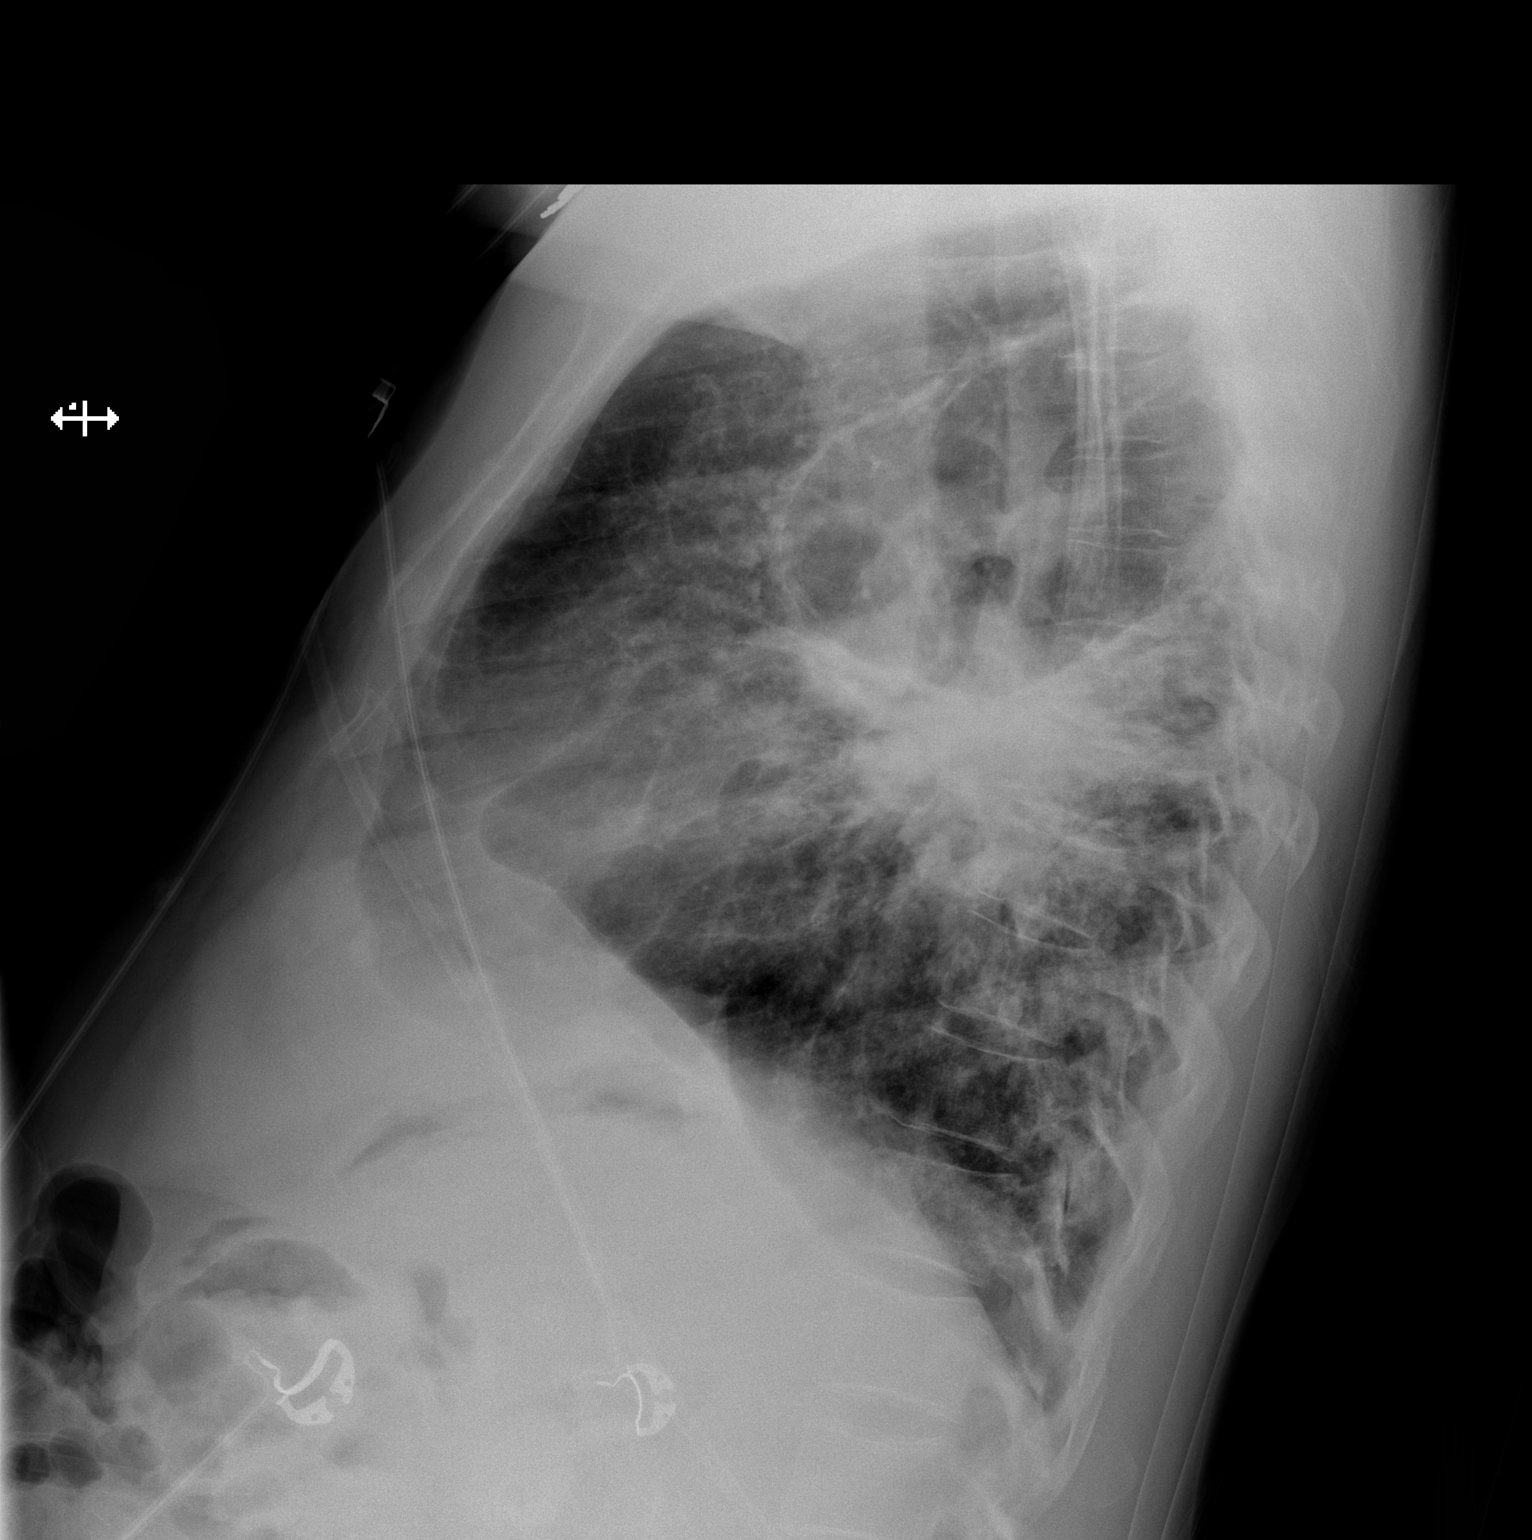

[x chest ap]
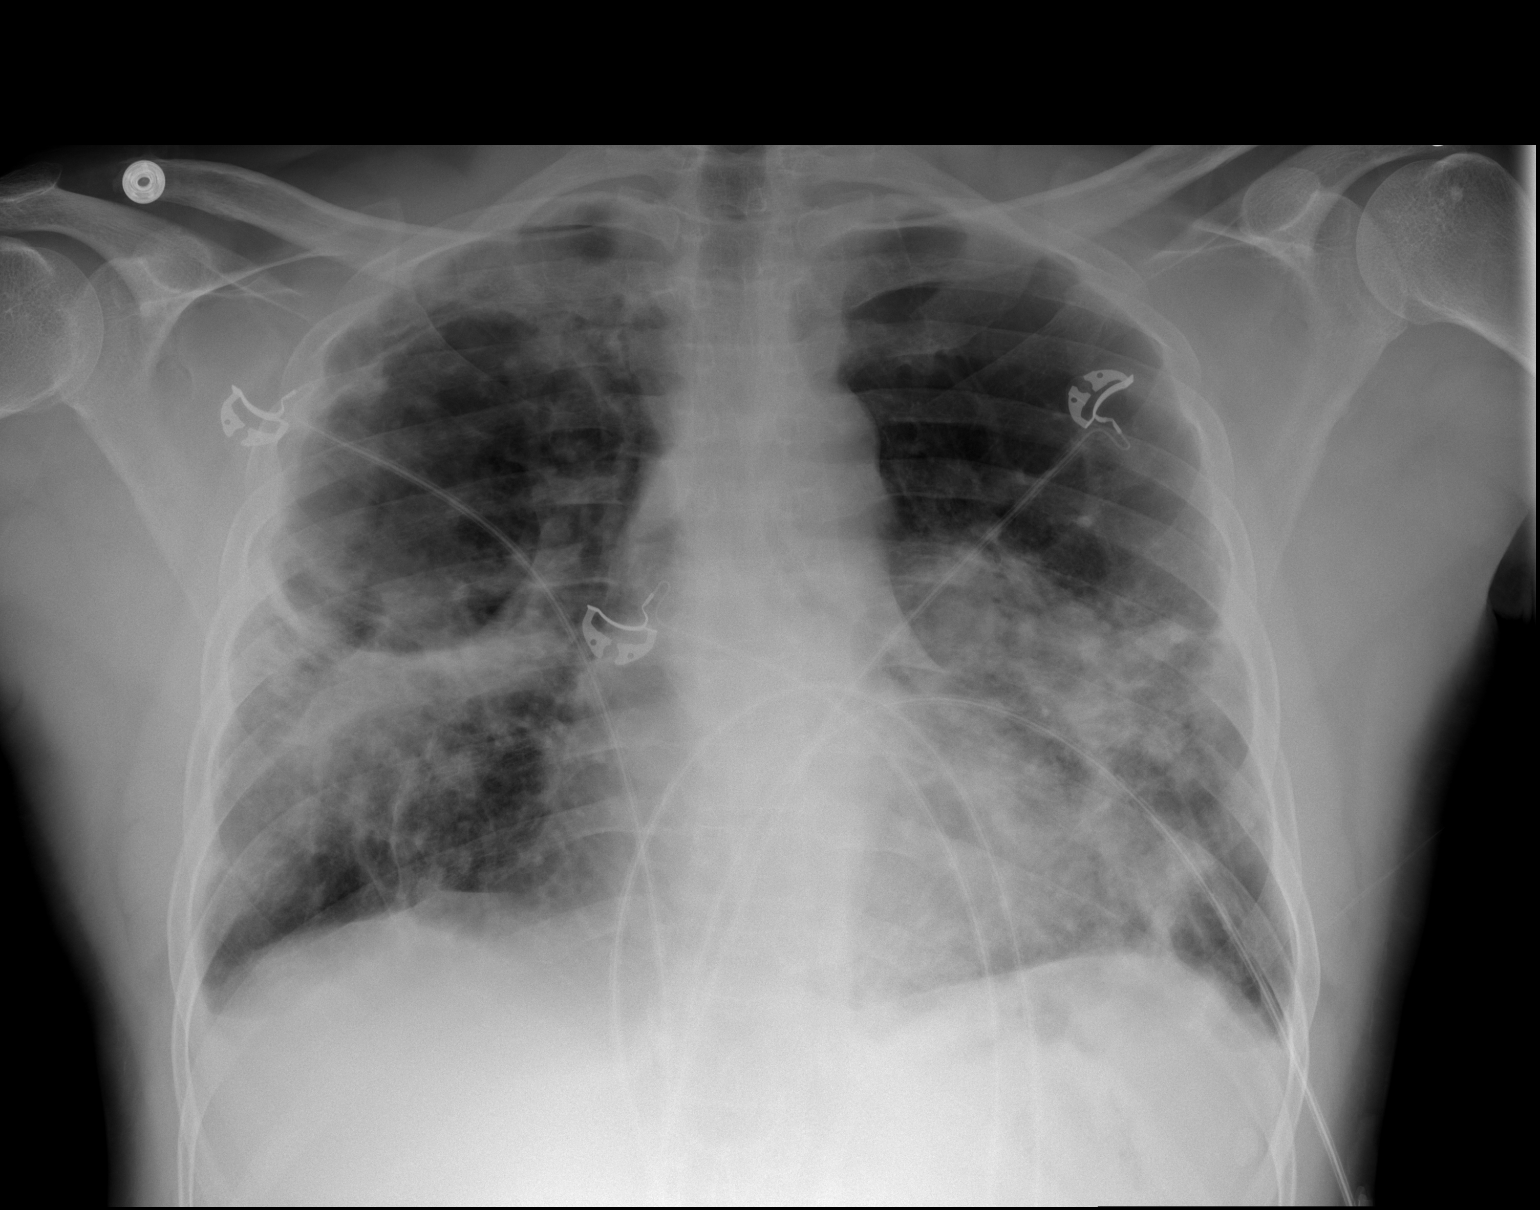

[2 of 2 positions shown; findings below may reference images not displayed]

FINDINGS: The large cavitary lesion previously noted within the
right upper lobe is stable.  In addition parenchymal opacity in the
mid lungs left greater than right is also stable most consistent
with scarring from sarcoidosis by history.  No definite active
process is seen.  Mild cardiomegaly is stable.  No effusion is
noted.
IMPRESSION: Stable chronic changes of sarcoidosis.  No definite active process.

## 2012-01-06 ENCOUNTER — Ambulatory Visit (INDEPENDENT_AMBULATORY_CARE_PROVIDER_SITE_OTHER): Payer: 59 | Admitting: Emergency Medicine

## 2012-01-06 ENCOUNTER — Encounter: Payer: Self-pay | Admitting: Emergency Medicine

## 2012-01-06 VITALS — BP 142/90 | HR 108 | Temp 98.1°F | Wt 155.2 lb

## 2012-01-06 DIAGNOSIS — D869 Sarcoidosis, unspecified: Secondary | ICD-10-CM

## 2012-01-06 MED ORDER — BECLOMETHASONE DIPROPIONATE 80 MCG/ACT IN AERS: 2.0000 | INHALATION_SPRAY | Freq: Two times a day (BID) | RESPIRATORY_TRACT | Status: DC

## 2012-01-06 NOTE — Progress Notes (Signed)
  Subjective:    Patient ID: [name], male    DOB: [birth year], 46 y.o.   MRN: [id number]  HPI 46 yo man, never smoker, hx sarcoidosis dx by transbronchial bx's in 2002 and again in 2011 or 2012, also OSA on CPAP. He has occasional flares that sound asthmatic in nature, gets treated with prednisone. He flares typically a few times a year, but he has also gone for over a year without an exacerbation. He has never been on everyday meds, has been on pred with exacerbations - longest he was on it was 90 days. He has also been on a steroid-sparing agent before (?MTX), but it was stopped. He has a large cavity in R lung, scattered infiltrates. He has been on Advair before, Spiriva before, not currently. Has albuterol available prn, uses almost every day. Last PFT were last year at [facility].   ROV 01/06/12 -- sarcoidosis, R lung cavitary disease. Was recently hospitalized for an acute exacerbation. D/c after pred taper. Last CT scan was 4/13. He is using nebs 2 -3 x a day.      Objective:   Physical Exam Filed Vitals:   01/06/12 1621  BP: 142/90  Pulse: 108  Temp: 98.1 F (36.7 C)   Gen: Pleasant, well-nourished, in no distress,  normal affect  ENT: No lesions,  mouth clear,  oropharynx clear, no postnasal drip  Neck: No JVD, no TMG, no carotid bruits  Lungs: No use of accessory muscles, distant, no wheezes or crackles  Cardiovascular: RRR, heart sounds normal, no murmur or gallops, no peripheral edema  Musculoskeletal: No deformities, no cyanosis or clubbing  Neuro: alert, non focal  Skin: Warm, no lesions or rashes     Assessment & Plan:  Sarcoid Follow CT scan in April 2014 Start qvar 80 refer to transplant center at [facility] rov 6 weeks

## 2012-01-06 NOTE — Assessment & Plan Note (Signed)
Follow CT scan in April 2014 Start qvar 80 refer to transplant center at [facility] rov 6 weeks

## 2012-01-06 NOTE — Patient Instructions (Addendum)
Please start QVAR 80, 2 puffs twice a day Continue to use your albuterol nebs as needed We will repeat your CT scan of the chest in April 2014 We will refer you to the transplant clinic at [facility] Follow with Dr Delton Coombes in 6 weeks

## 2012-02-13 ENCOUNTER — Emergency Department (HOSPITAL_COMMUNITY): Payer: 59

## 2012-02-13 ENCOUNTER — Inpatient Hospital Stay (HOSPITAL_COMMUNITY)
Admission: EM | Admit: 2012-02-13 | Discharge: 2012-02-18 | DRG: 197 | Disposition: A | Discharge status: Home / Self Care | Payer: 59 | Attending: Internal Medicine | Admitting: Internal Medicine

## 2012-02-13 ENCOUNTER — Encounter (HOSPITAL_COMMUNITY): Payer: Self-pay

## 2012-02-13 DIAGNOSIS — J9610 Chronic respiratory failure, unspecified whether with hypoxia or hypercapnia: Secondary | ICD-10-CM | POA: Diagnosis present

## 2012-02-13 DIAGNOSIS — J441 Chronic obstructive pulmonary disease with (acute) exacerbation: Secondary | ICD-10-CM

## 2012-02-13 DIAGNOSIS — J471 Bronchiectasis with (acute) exacerbation: Secondary | ICD-10-CM | POA: Diagnosis present

## 2012-02-13 DIAGNOSIS — Z79899 Other long term (current) drug therapy: Secondary | ICD-10-CM

## 2012-02-13 DIAGNOSIS — Z9981 Dependence on supplemental oxygen: Secondary | ICD-10-CM

## 2012-02-13 DIAGNOSIS — J189 Pneumonia, unspecified organism: Secondary | ICD-10-CM

## 2012-02-13 DIAGNOSIS — G4733 Obstructive sleep apnea (adult) (pediatric): Secondary | ICD-10-CM

## 2012-02-13 DIAGNOSIS — T380X5A Adverse effect of glucocorticoids and synthetic analogues, initial encounter: Secondary | ICD-10-CM | POA: Diagnosis present

## 2012-02-13 DIAGNOSIS — I10 Essential (primary) hypertension: Secondary | ICD-10-CM

## 2012-02-13 DIAGNOSIS — I498 Other specified cardiac arrhythmias: Secondary | ICD-10-CM | POA: Diagnosis present

## 2012-02-13 DIAGNOSIS — R000 Tachycardia, unspecified: Secondary | ICD-10-CM

## 2012-02-13 DIAGNOSIS — R739 Hyperglycemia, unspecified: Secondary | ICD-10-CM

## 2012-02-13 DIAGNOSIS — J40 Bronchitis, not specified as acute or chronic: Secondary | ICD-10-CM

## 2012-02-13 DIAGNOSIS — J99 Respiratory disorders in diseases classified elsewhere: Secondary | ICD-10-CM | POA: Diagnosis present

## 2012-02-13 DIAGNOSIS — T50905A Adverse effect of unspecified drugs, medicaments and biological substances, initial encounter: Secondary | ICD-10-CM

## 2012-02-13 DIAGNOSIS — D869 Sarcoidosis, unspecified: Principal | ICD-10-CM

## 2012-02-13 DIAGNOSIS — R7309 Other abnormal glucose: Secondary | ICD-10-CM | POA: Diagnosis present

## 2012-02-13 DIAGNOSIS — J209 Acute bronchitis, unspecified: Secondary | ICD-10-CM

## 2012-02-13 DIAGNOSIS — Z9989 Dependence on other enabling machines and devices: Secondary | ICD-10-CM

## 2012-02-13 LAB — POCT I-STAT, CHEM 8
Calcium, Ion: 1.15 mmol/L (ref 1.12–1.23)
Chloride: 102 mEq/L (ref 96–112)
HCT: 39 % (ref 39.0–52.0)
Sodium: 140 mEq/L (ref 135–145)
TCO2: 28 mmol/L (ref 0–100)

## 2012-02-13 LAB — CBC WITH DIFFERENTIAL/PLATELET
Eosinophils Absolute: 0.5 10*3/uL (ref 0.0–0.7)
Eosinophils Relative: 10 % — ABNORMAL HIGH (ref 0–5)
Hemoglobin: 12 g/dL — ABNORMAL LOW (ref 13.0–17.0)
Lymphocytes Relative: 16 % (ref 12–46)
Lymphs Abs: 0.8 10*3/uL (ref 0.7–4.0)
MCH: 25.7 pg — ABNORMAL LOW (ref 26.0–34.0)
MCV: 79.9 fL (ref 78.0–100.0)
Monocytes Relative: 8 % (ref 3–12)
Neutrophils Relative %: 66 % (ref 43–77)
Platelets: 315 10*3/uL (ref 150–400)
RBC: 4.67 MIL/uL (ref 4.22–5.81)
WBC: 5.1 10*3/uL (ref 4.0–10.5)

## 2012-02-13 LAB — GLUCOSE, CAPILLARY

## 2012-02-13 MED ORDER — METHYLPREDNISOLONE SODIUM SUCC 125 MG IJ SOLR
125.0000 mg | Freq: Four times a day (QID) | INTRAMUSCULAR | Status: DC
  Administered 2012-02-14 – 2012-02-16 (×11): 125 mg via INTRAVENOUS
  Filled 2012-02-13 (×14): qty 2

## 2012-02-13 MED ORDER — GUAIFENESIN-DM 100-10 MG/5ML PO SYRP
5.0000 mL | ORAL_SOLUTION | ORAL | Status: DC | PRN
  Administered 2012-02-14 – 2012-02-15 (×2): 5 mL via ORAL
  Filled 2012-02-13 (×2): qty 10

## 2012-02-13 MED ORDER — ACETAMINOPHEN 650 MG RE SUPP: 650.0000 mg | Freq: Four times a day (QID) | RECTAL | Status: DC | PRN

## 2012-02-13 MED ORDER — DIPHENHYDRAMINE HCL 25 MG PO CAPS
25.0000 mg | ORAL_CAPSULE | Freq: Four times a day (QID) | ORAL | Status: DC | PRN
  Filled 2012-02-13: qty 1

## 2012-02-13 MED ORDER — FLUTICASONE PROPIONATE HFA 44 MCG/ACT IN AERO: 2.0000 | INHALATION_SPRAY | Freq: Two times a day (BID) | RESPIRATORY_TRACT | Status: DC

## 2012-02-13 MED ORDER — CYCLOBENZAPRINE HCL 10 MG PO TABS
10.0000 mg | ORAL_TABLET | Freq: Three times a day (TID) | ORAL | Status: DC | PRN
  Administered 2012-02-17: 10 mg via ORAL
  Filled 2012-02-13 (×2): qty 1

## 2012-02-13 MED ORDER — CALCIUM CARBONATE-VITAMIN D 250-125 MG-UNIT PO TABS
1.0000 | ORAL_TABLET | Freq: Every day | ORAL | Status: DC
  Administered 2012-02-14 – 2012-02-18 (×5): 1 via ORAL
  Filled 2012-02-13 (×7): qty 1

## 2012-02-13 MED ORDER — SODIUM CHLORIDE 0.9 % IJ SOLN: 3.0000 mL | INTRAMUSCULAR | Status: DC | PRN

## 2012-02-13 MED ORDER — VITAMIN D3 25 MCG (1000 UNIT) PO TABS
1000.0000 [IU] | ORAL_TABLET | Freq: Every day | ORAL | Status: DC
  Administered 2012-02-14 – 2012-02-18 (×5): 1000 [IU] via ORAL
  Filled 2012-02-13 (×5): qty 1

## 2012-02-13 MED ORDER — CHLORTHALIDONE 25 MG PO TABS
25.0000 mg | ORAL_TABLET | Freq: Every day | ORAL | Status: DC
  Administered 2012-02-14: 25 mg via ORAL
  Filled 2012-02-13 (×2): qty 1

## 2012-02-13 MED ORDER — SODIUM CHLORIDE 0.9 % IV SOLN: 250.0000 mL | INTRAVENOUS | Status: DC | PRN

## 2012-02-13 MED ORDER — LEVOFLOXACIN IN D5W 750 MG/150ML IV SOLN
750.0000 mg | INTRAVENOUS | Status: DC
  Administered 2012-02-14: 750 mg via INTRAVENOUS
  Filled 2012-02-13 (×2): qty 150

## 2012-02-13 MED ORDER — TRAZODONE HCL 100 MG PO TABS
100.0000 mg | ORAL_TABLET | Freq: Every evening | ORAL | Status: DC | PRN
  Administered 2012-02-14: 100 mg via ORAL
  Filled 2012-02-13: qty 1

## 2012-02-13 MED ORDER — FLUTICASONE PROPIONATE HFA 44 MCG/ACT IN AERO
2.0000 | INHALATION_SPRAY | Freq: Two times a day (BID) | RESPIRATORY_TRACT | Status: DC
  Administered 2012-02-13 – 2012-02-18 (×10): 2 via RESPIRATORY_TRACT
  Filled 2012-02-13: qty 10.6

## 2012-02-13 MED ORDER — LIDOCAINE 5 % EX PTCH
1.0000 | MEDICATED_PATCH | CUTANEOUS | Status: DC
  Administered 2012-02-14 – 2012-02-16 (×3): 1 via TRANSDERMAL
  Filled 2012-02-13 (×6): qty 1

## 2012-02-13 MED ORDER — AMITRIPTYLINE HCL 25 MG PO TABS
25.0000 mg | ORAL_TABLET | Freq: Every evening | ORAL | Status: DC | PRN
  Filled 2012-02-13: qty 1

## 2012-02-13 MED ORDER — IPRATROPIUM BROMIDE 0.02 % IN SOLN
0.5000 mg | Freq: Four times a day (QID) | RESPIRATORY_TRACT | Status: DC
  Administered 2012-02-13 – 2012-02-18 (×17): 0.5 mg via RESPIRATORY_TRACT
  Filled 2012-02-13 (×19): qty 2.5

## 2012-02-13 MED ORDER — ACETAMINOPHEN 325 MG PO TABS
650.0000 mg | ORAL_TABLET | Freq: Four times a day (QID) | ORAL | Status: DC | PRN
  Administered 2012-02-14: 650 mg via ORAL
  Filled 2012-02-13: qty 2

## 2012-02-13 MED ORDER — PREGABALIN 100 MG PO CAPS
200.0000 mg | ORAL_CAPSULE | Freq: Two times a day (BID) | ORAL | Status: DC
  Administered 2012-02-14 – 2012-02-18 (×10): 200 mg via ORAL
  Filled 2012-02-13 (×10): qty 2

## 2012-02-13 MED ORDER — COD LIVER OIL PO CAPS: 1.0000 | ORAL_CAPSULE | Freq: Every day | ORAL | Status: DC

## 2012-02-13 MED ORDER — ALBUTEROL SULFATE (5 MG/ML) 0.5% IN NEBU
5.0000 mg | INHALATION_SOLUTION | Freq: Once | RESPIRATORY_TRACT | Status: AC
  Administered 2012-02-13: 5 mg via RESPIRATORY_TRACT
  Filled 2012-02-13: qty 1

## 2012-02-13 MED ORDER — OMEGA-3-ACID ETHYL ESTERS 1 G PO CAPS
1.0000 g | ORAL_CAPSULE | Freq: Two times a day (BID) | ORAL | Status: DC
  Administered 2012-02-14 – 2012-02-18 (×9): 1 g via ORAL
  Filled 2012-02-13 (×10): qty 1

## 2012-02-13 MED ORDER — GUAIFENESIN ER 600 MG PO TB12
600.0000 mg | ORAL_TABLET | Freq: Two times a day (BID) | ORAL | Status: DC
  Administered 2012-02-14 – 2012-02-18 (×10): 600 mg via ORAL
  Filled 2012-02-13 (×11): qty 1

## 2012-02-13 MED ORDER — ADULT MULTIVITAMIN W/MINERALS CH
1.0000 | ORAL_TABLET | Freq: Every day | ORAL | Status: DC
  Administered 2012-02-14 – 2012-02-18 (×5): 1 via ORAL
  Filled 2012-02-13 (×5): qty 1

## 2012-02-13 MED ORDER — ALBUTEROL SULFATE (5 MG/ML) 0.5% IN NEBU
2.5000 mg | INHALATION_SOLUTION | RESPIRATORY_TRACT | Status: DC
  Administered 2012-02-13 – 2012-02-15 (×8): 2.5 mg via RESPIRATORY_TRACT
  Filled 2012-02-13 (×8): qty 0.5

## 2012-02-13 MED ORDER — SODIUM CHLORIDE 0.9 % IJ SOLN
3.0000 mL | Freq: Two times a day (BID) | INTRAMUSCULAR | Status: DC
  Administered 2012-02-14 – 2012-02-16 (×3): 3 mL via INTRAVENOUS

## 2012-02-13 MED ORDER — LEVOFLOXACIN IN D5W 500 MG/100ML IV SOLN
500.0000 mg | Freq: Once | INTRAVENOUS | Status: AC
  Administered 2012-02-13: 500 mg via INTRAVENOUS
  Filled 2012-02-13: qty 100

## 2012-02-13 MED ORDER — ALBUTEROL SULFATE (5 MG/ML) 0.5% IN NEBU: 2.5000 mg | INHALATION_SOLUTION | RESPIRATORY_TRACT | Status: DC | PRN

## 2012-02-13 MED ORDER — PANTOPRAZOLE SODIUM 40 MG PO TBEC
40.0000 mg | DELAYED_RELEASE_TABLET | Freq: Every day | ORAL | Status: DC
  Administered 2012-02-14 – 2012-02-18 (×6): 40 mg via ORAL
  Filled 2012-02-13 (×6): qty 1

## 2012-02-13 MED ORDER — INSULIN ASPART 100 UNIT/ML ~~LOC~~ SOLN
0.0000 [IU] | SUBCUTANEOUS | Status: DC
  Administered 2012-02-14: 1 [IU] via SUBCUTANEOUS
  Administered 2012-02-14 – 2012-02-15 (×8): 2 [IU] via SUBCUTANEOUS
  Administered 2012-02-15 – 2012-02-16 (×5): 1 [IU] via SUBCUTANEOUS
  Administered 2012-02-16 (×3): 2 [IU] via SUBCUTANEOUS
  Administered 2012-02-17: 1 [IU] via SUBCUTANEOUS
  Administered 2012-02-17: 0 [IU] via SUBCUTANEOUS
  Administered 2012-02-17: 1 [IU] via SUBCUTANEOUS
  Administered 2012-02-17: 2 [IU] via SUBCUTANEOUS
  Administered 2012-02-17 – 2012-02-18 (×4): 1 [IU] via SUBCUTANEOUS

## 2012-02-13 MED ORDER — SODIUM CHLORIDE 0.9 % IJ SOLN
3.0000 mL | Freq: Two times a day (BID) | INTRAMUSCULAR | Status: DC
  Administered 2012-02-13 – 2012-02-18 (×2): 3 mL via INTRAVENOUS

## 2012-02-13 MED ORDER — IPRATROPIUM BROMIDE 0.02 % IN SOLN
0.5000 mg | Freq: Once | RESPIRATORY_TRACT | Status: AC
  Administered 2012-02-13: 0.5 mg via RESPIRATORY_TRACT
  Filled 2012-02-13: qty 2.5

## 2012-02-13 MED ORDER — VITAMIN C 250 MG PO TABS
250.0000 mg | ORAL_TABLET | Freq: Every day | ORAL | Status: DC
  Administered 2012-02-14 – 2012-02-18 (×5): 250 mg via ORAL
  Filled 2012-02-13 (×5): qty 1

## 2012-02-13 MED ORDER — HEPARIN SODIUM (PORCINE) 5000 UNIT/ML IJ SOLN
5000.0000 [IU] | Freq: Three times a day (TID) | INTRAMUSCULAR | Status: DC
  Administered 2012-02-14 – 2012-02-18 (×14): 5000 [IU] via SUBCUTANEOUS
  Filled 2012-02-13 (×17): qty 1

## 2012-02-13 MED ORDER — METHYLPREDNISOLONE SODIUM SUCC 125 MG IJ SOLR
125.0000 mg | Freq: Once | INTRAMUSCULAR | Status: AC
  Administered 2012-02-13: 125 mg via INTRAVENOUS
  Filled 2012-02-13: qty 2

## 2012-02-13 NOTE — ED Provider Notes (Signed)
History     CSN: [id number]  Arrival date & time 02/13/12  1723   First MD Initiated Contact with Patient 02/13/12 1804      Chief Complaint  Patient presents with  . Shortness of Breath  . Cough    (Consider location/radiation/quality/duration/timing/severity/associated sxs/prior treatment) The history is provided by the patient.   patient's history of severe sarcoidosis. He's been doing worse over the last few weeks, but worse over the last 2 days. He states he's had chills at home. He has had a cough. No vomiting or diarrhea. He states he's had to use more of his oxygen. No chest pain. He has had some pain in the lower ribs. No abdominal pain.  Past Medical History  Diagnosis Date  . Hypertension   . Sarcoidosis   . OSA on CPAP   . Chronic respiratory failure     hom oxygen PRN  . Bronchitis   . Pneumonia     Past Surgical History  Procedure Date  . Rotator cuff repair   . Arm surgery      Family History  Problem Relation Age of Onset  . Diabetes Father     History  Substance Use Topics  . Smoking status: Never Smoker   . Smokeless tobacco: Never Used  . Alcohol Use: Yes     Comment: social      Review of Systems  Constitutional: Positive for chills. Negative for activity change and appetite change.  HENT: Negative for neck stiffness.   Eyes: Negative for pain.  Respiratory: Positive for cough, shortness of breath and wheezing. Negative for chest tightness.   Cardiovascular: Negative for chest pain and leg swelling.  Gastrointestinal: Negative for nausea, vomiting, abdominal pain and diarrhea.  Genitourinary: Negative for flank pain.  Musculoskeletal: Negative for back pain.  Skin: Negative for rash.  Neurological: Negative for weakness, numbness and headaches.  Psychiatric/Behavioral: Negative for behavioral problems.    Allergies  Clindamycin/lincomycin  Home Medications   Current Outpatient Rx  Name  Route  Sig  Dispense  Refill  .  ALBUTEROL SULFATE HFA 108 (90 BASE) MCG/ACT IN AERS   Inhalation   Inhale 2 puffs into the lungs every 6 (six) hours as needed. Wheezing         . AMITRIPTYLINE HCL 25 MG PO TABS   Oral   Take 25 mg by mouth at bedtime as needed. For sleep         . BECLOMETHASONE DIPROPIONATE 80 MCG/ACT IN AERS   Inhalation   Inhale 2 puffs into the lungs 2 (two) times daily.         Marland Kitchen CALCIUM + D PO   Oral   Take 1 tablet by mouth daily.         . CHLORTHALIDONE 25 MG PO TABS   Oral   Take 25 mg by mouth daily.         Marland Kitchen VITAMIN D 1000 UNITS PO TABS   Oral   Take 1,000 Units by mouth daily.         . COD LIVER OIL PO CAPS   Oral   Take 1 capsule by mouth daily.         . CYCLOBENZAPRINE HCL 10 MG PO TABS   Oral   Take 10 mg by mouth 3 (three) times daily as needed. Muscle spasms.         Marland Kitchen DIPHENHYDRAMINE HCL 25 MG PO TABS   Oral   Take 25 mg  by mouth every 6 (six) hours as needed. Allergies         . GUAIFENESIN ER 600 MG PO TB12   Oral   Take 600-1,200 mg by mouth 2 (two) times daily as needed. For congestion.         Marland Kitchen LIDOCAINE 5 % EX PTCH   Transdermal   Place 1 patch onto the skin daily. Remove & Discard patch within 12 hours or as directed by MD         . MULTI-VITAMIN/MINERALS PO TABS   Oral   Take 1 tablet by mouth daily.         Marland Kitchen OMEPRAZOLE 20 MG PO CPDR   Oral   Take 20 mg by mouth daily.         Marland Kitchen PREGABALIN 200 MG PO CAPS   Oral   Take 200 mg by mouth 2 (two) times daily.         . TRAZODONE HCL 100 MG PO TABS   Oral   Take 100 mg by mouth at bedtime as needed. sleep         . VARDENAFIL HCL 10 MG PO TABS   Oral   Take 10 mg by mouth as needed. E.D.         . VITAMIN C 250 MG PO TABS   Oral   Take 250 mg by mouth daily.           BP 138/95  Pulse 104  Temp 97.9 F (36.6 C) (Axillary)  Resp 22  SpO2 98%  Physical Exam  Nursing note and vitals reviewed. Constitutional: He is oriented to person, place, and  time. He appears well-developed and well-nourished.  HENT:  Head: Normocephalic and atraumatic.  Eyes: EOM are normal. Pupils are equal, round, and reactive to light.  Neck: Normal range of motion. Neck supple.  Cardiovascular: Regular rhythm and normal heart sounds.   No murmur heard.      Tachycardia  Pulmonary/Chest: Effort normal. He has wheezes.       Diffuse wheezes and prolonged expirations. No respiratory distress.  Abdominal: Soft. Bowel sounds are normal. He exhibits no distension and no mass. There is no tenderness. There is no rebound and no guarding.  Musculoskeletal: Normal range of motion. He exhibits no edema.  Neurological: He is alert and oriented to person, place, and time. No cranial nerve deficit.  Skin: Skin is warm and dry.  Psychiatric: He has a normal mood and affect.    ED Course  Procedures (including critical care time)  Labs Reviewed  CBC WITH DIFFERENTIAL - Abnormal; Notable for the following:    Hemoglobin 12.0 (*)     HCT 37.3 (*)     MCH 25.7 (*)     Eosinophils Relative 10 (*)     All other components within normal limits  POCT I-STAT, CHEM 8   Dg Chest 2 View  02/13/2012  *RADIOLOGY REPORT*  Clinical Data: History of pulmonary sarcoidosis, now with worsening shortness of breath  CHEST - 2 VIEW  Comparison: 12/16/2011; 01/15/2011; 10/21/2010; chest CT - 04/12/2011  Findings:  Grossly unchanged cardiac silhouette and mediastinal contours. Extensive bilateral mid lung heterogeneous air space opacities are grossly unchanged, left greater than right.  Grossly stable findings of bullous change involving the right upper lobe with associated asymmetric right apical pleuroparenchymal thickening. Given extensive background parenchymal abnormalities, there are no new focal airspace opacities.  No pleural effusion or pneumothorax. Unchanged bones including resection of the  distal end of the right clavicle.  IMPRESSION: Grossly unchanged findings of extensive  pulmonary sarcoidosis with associated opacities and architectural distortion without definite superimposed acute cardiopulmonary disease.   Original Report Authenticated By: Tacey Ruiz, MD      1. Sarcoidosis   2. Bronchitis       MDM  Patient presented for shortness breath and cough. History of severe sarcoidosis with possible lung transplant. He's had a cough with chills. X-ray is stable. He continues to be tachycardic and hypoxic on room air. He'll be admitted to medicine for further evaluation and treatment.        Juliet Rude. Rubin Payor, MD 02/13/12 2110

## 2012-02-13 NOTE — H&P (Signed)
Hospitalist Admission History and Physical  Patient name: [name] Medical record number: [id number] Date of birth: 1966-01-07 Age: 47 y.o. Gender: male  Primary Care Provider: Provider Not In System  Chief Complaint: shortness of breath History of Present Illness:This is a 47 y.o. year old male with significant past medical history of sarcoidosis presenting with cough, shortness of breath, wheezing. Patient states symptoms have been present for the past 3-4 days. Patient states he had progressive increased work of breathing, wheezing, shortness of breath it was not improved with when necessary albuterol at home. Patient does use supplemental oxygen at home and has been having uses on a more scheduled basis secondary increased work of breathing. Patient also reports generalized malaise and body aches. Patient has had his flu shot this season. No nausea vomiting. No headache. Patient states that he woke up this morning and he felt like he couldn't breathe without taking obese to 3 puffs of his albuterol inhaler as well as wearing a supplemental oxygen. Patient also wears CPAP. Patient has been compliant with this. In ED patient is noted to be moderately hypoxic with O2 sats in the upper 80s off of oxygen. In the ED patient received IV Solu-Medrol and IV Levaquin as well as DuoNeb treatment. Patient does report some clinical improvement status post IV medications.  Patient Active Problem List  Diagnosis  . Acute bronchitis  . Sarcoid  . OSA on CPAP  . Tracheobronchitis  . CAP (community acquired pneumonia)  . Obstructive chronic bronchitis with acute exacerbation  . Hyperglycemia, drug-induced   Past Medical History: Past Medical History  Diagnosis Date  . Hypertension   . Sarcoidosis   . OSA on CPAP   . Chronic respiratory failure     hom oxygen PRN  . Bronchitis   . Pneumonia     Past Surgical History: Past Surgical History  Procedure Date  . Rotator cuff repair   . Arm  surgery      Social History: History   Social History  . Marital Status: Divorced    Spouse Name: N/A    Number of Children: 2  . Years of Education: N/A   Occupational History  .     Social History Main Topics  . Smoking status: Never Smoker   . Smokeless tobacco: Never Used  . Alcohol Use: Yes     Comment: social  . Drug Use: No  . Sexually Active: None   Other Topics Concern  . None   Social History Narrative  . None    Family History: Family History  Problem Relation Age of Onset  . Diabetes Father     Allergies: Allergies  Allergen Reactions  . Clindamycin/Lincomycin Swelling    Current Facility-Administered Medications  Medication Dose Route Frequency Provider Last Rate Last Dose  . 0.9 %  sodium chloride infusion  250 mL Intravenous PRN Doree Albee, MD      . acetaminophen (TYLENOL) tablet 650 mg  650 mg Oral Q6H PRN Doree Albee, MD       Or  . acetaminophen (TYLENOL) suppository 650 mg  650 mg Rectal Q6H PRN Doree Albee, MD      . albuterol (PROVENTIL) (5 MG/ML) 0.5% nebulizer solution 2.5 mg  2.5 mg Nebulization Q2H PRN Doree Albee, MD      . albuterol (PROVENTIL) (5 MG/ML) 0.5% nebulizer solution 2.5 mg  2.5 mg Nebulization Q4H Doree Albee, MD      . guaiFENesin [facility]) 12 hr tablet 600 mg  600  mg Oral BID Doree Albee, MD      . guaiFENesin-dextromethorphan [facility] DM) 100-10 MG/5ML syrup 5 mL  5 mL Oral Q4H PRN Doree Albee, MD      . heparin injection 5,000 Units  5,000 Units Subcutaneous Q8H Doree Albee, MD      . ipratropium (ATROVENT) nebulizer solution 0.5 mg  0.5 mg Nebulization Q6H Doree Albee, MD      . levofloxacin (LEVAQUIN) IVPB 500 mg  500 mg Intravenous Once Harrold Donath R. Pickering, MD 100 mL/hr at 02/13/12 2059 500 mg at 02/13/12 2059  . levofloxacin (LEVAQUIN) IVPB 750 mg  750 mg Intravenous Q24H Doree Albee, MD      . methylPREDNISolone sodium succinate (SOLU-MEDROL) 125 mg/2 mL injection 125 mg  125 mg  Intravenous Q6H Doree Albee, MD      . sodium chloride 0.9 % injection 3 mL  3 mL Intravenous Q12H Doree Albee, MD      . sodium chloride 0.9 % injection 3 mL  3 mL Intravenous Q12H Doree Albee, MD      . sodium chloride 0.9 % injection 3 mL  3 mL Intravenous PRN Doree Albee, MD       Current Outpatient Prescriptions  Medication Sig Dispense Refill  . albuterol (PROVENTIL HFA;VENTOLIN HFA) 108 (90 BASE) MCG/ACT inhaler Inhale 2 puffs into the lungs every 6 (six) hours as needed. Wheezing      . amitriptyline (ELAVIL) 25 MG tablet Take 25 mg by mouth at bedtime as needed. For sleep      . beclomethasone (QVAR) 80 MCG/ACT inhaler Inhale 2 puffs into the lungs 2 (two) times daily.      . Calcium Carbonate-Vitamin D (CALCIUM + D PO) Take 1 tablet by mouth daily.      . chlorthalidone (HYGROTON) 25 MG tablet Take 25 mg by mouth daily.      . cholecalciferol (VITAMIN D) 1000 UNITS tablet Take 1,000 Units by mouth daily.      Marland Kitchen Cod Liver Oil CAPS Take 1 capsule by mouth daily.      . cyclobenzaprine (FLEXERIL) 10 MG tablet Take 10 mg by mouth 3 (three) times daily as needed. Muscle spasms.      . diphenhydrAMINE (BENADRYL) 25 MG tablet Take 25 mg by mouth every 6 (six) hours as needed. Allergies      . guaiFENesin (MUCINEX) 600 MG 12 hr tablet Take 600-1,200 mg by mouth 2 (two) times daily as needed. For congestion.      . lidocaine (LIDODERM) 5 % Place 1 patch onto the skin daily. Remove & Discard patch within 12 hours or as directed by MD      . Multiple Vitamins-Minerals (MULTIVITAMIN WITH MINERALS) tablet Take 1 tablet by mouth daily.      Marland Kitchen omeprazole (PRILOSEC) 20 MG capsule Take 20 mg by mouth daily.      . pregabalin (LYRICA) 200 MG capsule Take 200 mg by mouth 2 (two) times daily.      . traZODone (DESYREL) 100 MG tablet Take 100 mg by mouth at bedtime as needed. sleep      . vardenafil (LEVITRA) 10 MG tablet Take 10 mg by mouth as needed. E.D.      . vitamin C (ASCORBIC ACID) 250 MG  tablet Take 250 mg by mouth daily.       Review Of Systems: 12 point ROS negative except as noted above in HPI.  Physical Exam: Filed Vitals:   02/13/12 1929  BP: 138/95  Pulse: 104  Temp: 97.9 F (36.6 C)  Resp:     General: alert and cooperative HEENT: PERRLA and extra ocular movement intact Heart: S1, S2 normal, no murmur, rub or gallop, regular rate and rhythm Lungs: diffuse expiratory wheezes, + coughing with deep inspiration  Abdomen: abdomen is soft without significant tenderness, masses, organomegaly or guarding Extremities: extremities normal, atraumatic, no cyanosis or edema Skin:no rashes Neurology: normal without focal findings  Labs and Imaging: Lab Results  Component Value Date/Time   NA 140 02/13/2012  6:57 PM   K 3.7 02/13/2012  6:57 PM   CL 102 02/13/2012  6:57 PM   CO2 29 07/29/2011  3:31 AM   BUN 6 02/13/2012  6:57 PM   CREATININE 1.20 02/13/2012  6:57 PM   GLUCOSE 87 02/13/2012  6:57 PM   Lab Results  Component Value Date   WBC 5.1 02/13/2012   HGB 13.3 02/13/2012   HCT 39.0 02/13/2012   MCV 79.9 02/13/2012   PLT 315 02/13/2012   Dg Chest 2 View  02/13/2012  *RADIOLOGY REPORT*  Clinical Data: History of pulmonary sarcoidosis, now with worsening shortness of breath  CHEST - 2 VIEW  Comparison: 12/16/2011; 01/15/2011; 10/21/2010; chest CT - 04/12/2011  Findings:  Grossly unchanged cardiac silhouette and mediastinal contours. Extensive bilateral mid lung heterogeneous air space opacities are grossly unchanged, left greater than right.  Grossly stable findings of bullous change involving the right upper lobe with associated asymmetric right apical pleuroparenchymal thickening. Given extensive background parenchymal abnormalities, there are no new focal airspace opacities.  No pleural effusion or pneumothorax. Unchanged bones including resection of the distal end of the right clavicle.  IMPRESSION: Grossly unchanged findings of extensive pulmonary sarcoidosis with associated  opacities and architectural distortion without definite superimposed acute cardiopulmonary disease.   Original Report Authenticated By: Tacey Ruiz, MD       Assessment and Plan: Mykael Batz is a 47 y.o. year old male presenting with Sarcoidosis flare:  Resp: We'll continue IV Solu-Medrol as well as IV Levaquin. Continues home CPAP. Supplemental O2 as needed. Every 4 every 2 when necessary albuterol treatments. Patient reports he has had a good response with this regimen the past with prior hospitalizations. Anticipate a short hospitalization. Chronic pain: Continue home regimen. FEN/GI: SLIV. Regular diet. SSI as needed.  Prophylaxis: subq heparin.  Disposition: pending further evaluation. Anticipate short hospitalization (<2 days) Code Status: Full code.        Doree Albee MD  Pager: [phone]

## 2012-02-13 NOTE — ED Notes (Addendum)
Patient reports that he has been having a productive cough with yellow sputum and streaked with blood, having body aches, and cold sweats x 1 week. Patient has been using his nebulizer at home and not breathing any better. Patient is also using home O2 2 L/min and remains SOB.  Patient states he took his last neb treatment at 1600 today.

## 2012-02-14 ENCOUNTER — Inpatient Hospital Stay (HOSPITAL_COMMUNITY): Payer: 59

## 2012-02-14 DIAGNOSIS — I498 Other specified cardiac arrhythmias: Secondary | ICD-10-CM

## 2012-02-14 DIAGNOSIS — R000 Tachycardia, unspecified: Secondary | ICD-10-CM | POA: Diagnosis present

## 2012-02-14 DIAGNOSIS — J209 Acute bronchitis, unspecified: Secondary | ICD-10-CM

## 2012-02-14 LAB — COMPREHENSIVE METABOLIC PANEL
ALT: 23 U/L (ref 0–53)
Albumin: 3.2 g/dL — ABNORMAL LOW (ref 3.5–5.2)
Alkaline Phosphatase: 95 U/L (ref 39–117)
BUN: 9 mg/dL (ref 6–23)
Chloride: 99 mEq/L (ref 96–112)
Potassium: 4.2 mEq/L (ref 3.5–5.1)
Sodium: 135 mEq/L (ref 135–145)
Total Bilirubin: 0.2 mg/dL — ABNORMAL LOW (ref 0.3–1.2)

## 2012-02-14 LAB — CBC
HCT: 37.8 % — ABNORMAL LOW (ref 39.0–52.0)
MCH: 25.5 pg — ABNORMAL LOW (ref 26.0–34.0)
MCHC: 32 g/dL (ref 30.0–36.0)
MCV: 79.7 fL (ref 78.0–100.0)
Platelets: 345 10*3/uL (ref 150–400)
RDW: 14.1 % (ref 11.5–15.5)
WBC: 3.9 10*3/uL — ABNORMAL LOW (ref 4.0–10.5)

## 2012-02-14 LAB — GLUCOSE, CAPILLARY
Glucose-Capillary: 106 mg/dL — ABNORMAL HIGH (ref 70–99)
Glucose-Capillary: 173 mg/dL — ABNORMAL HIGH (ref 70–99)
Glucose-Capillary: 138 mg/dL — ABNORMAL HIGH (ref 70–99)
Glucose-Capillary: 178 mg/dL — ABNORMAL HIGH (ref 70–99)

## 2012-02-14 MED ORDER — IOHEXOL 350 MG/ML SOLN
100.0000 mL | Freq: Once | INTRAVENOUS | Status: AC | PRN
  Administered 2012-02-14: 80 mL via INTRAVENOUS

## 2012-02-14 MED ORDER — PREDNISONE 20 MG PO TABS: ORAL_TABLET | ORAL | Status: DC

## 2012-02-14 MED ORDER — LEVOFLOXACIN 750 MG PO TABS: 750.0000 mg | ORAL_TABLET | Freq: Every day | ORAL | Status: DC

## 2012-02-14 MED ORDER — HYDRALAZINE HCL 20 MG/ML IJ SOLN
5.0000 mg | INTRAMUSCULAR | Status: DC | PRN
  Administered 2012-02-14: 5 mg via INTRAVENOUS
  Filled 2012-02-14: qty 1

## 2012-02-14 MED ORDER — AZITHROMYCIN 250 MG PO TABS: 250.0000 mg | ORAL_TABLET | Freq: Every day | ORAL | Status: DC

## 2012-02-14 MED ORDER — ASPIRIN 325 MG PO TABS
325.0000 mg | ORAL_TABLET | Freq: Once | ORAL | Status: AC
  Administered 2012-02-14: 325 mg via ORAL
  Filled 2012-02-14: qty 1

## 2012-02-14 MED ORDER — MORPHINE SULFATE 2 MG/ML IJ SOLN
2.0000 mg | Freq: Once | INTRAMUSCULAR | Status: AC
  Administered 2012-02-14: 2 mg via INTRAVENOUS
  Filled 2012-02-14: qty 1

## 2012-02-14 MED ORDER — HYDRALAZINE HCL 20 MG/ML IJ SOLN
10.0000 mg | Freq: Once | INTRAMUSCULAR | Status: AC
  Administered 2012-02-14: 10 mg via INTRAVENOUS

## 2012-02-14 MED ORDER — HYDRALAZINE HCL 20 MG/ML IJ SOLN
10.0000 mg | INTRAMUSCULAR | Status: DC | PRN
  Administered 2012-02-14: 10 mg via INTRAVENOUS
  Filled 2012-02-14 (×2): qty 1

## 2012-02-14 MED ORDER — AZITHROMYCIN 500 MG PO TABS
500.0000 mg | ORAL_TABLET | Freq: Every day | ORAL | Status: AC
  Administered 2012-02-14: 500 mg via ORAL
  Filled 2012-02-14: qty 1

## 2012-02-14 MED ORDER — METOPROLOL TARTRATE 1 MG/ML IV SOLN
5.0000 mg | Freq: Once | INTRAVENOUS | Status: AC
  Administered 2012-02-14: 5 mg via INTRAVENOUS
  Filled 2012-02-14: qty 5

## 2012-02-14 MED ORDER — HYDROCODONE-ACETAMINOPHEN 5-325 MG PO TABS
1.0000 | ORAL_TABLET | Freq: Four times a day (QID) | ORAL | Status: DC | PRN
  Administered 2012-02-14 – 2012-02-17 (×4): 2 via ORAL
  Administered 2012-02-18: 1 via ORAL
  Administered 2012-02-18: 2 via ORAL
  Filled 2012-02-14 (×3): qty 2
  Filled 2012-02-14: qty 1
  Filled 2012-02-14 (×2): qty 2

## 2012-02-14 MED ORDER — GUAIFENESIN ER 600 MG PO TB12: 600.0000 mg | ORAL_TABLET | Freq: Two times a day (BID) | ORAL | Status: DC

## 2012-02-14 NOTE — Progress Notes (Signed)
Placed pt on cpap for rest, however pt unable to tolerate the air/noise coming out from the exhalation port.  Pt was explained importance of this port staying open.  Placed pt back on 2l [place] per his request.  RN notified.

## 2012-02-14 NOTE — Progress Notes (Signed)
   CARE MANAGEMENT NOTE 02/14/2012  Patient:  [name],[name]   Account Number:  [id number]  Date Initiated:  02/14/2012  Documentation initiated by:  Jiles Crocker  Subjective/Objective Assessment:   ADMITTED WITH SOB / BRONCHITIS     Action/Plan:   LIVES ALONE/ USE CPAP AT BEDTIME   Anticipated DC Date:  02/21/2012   Anticipated DC Plan:  HOME/SELF CARE      DC Planning Services  CM consult          Status of service:  In process, will continue to follow Medicare Important Message given?  NA - LOS <3 / Initial given by admissions (If response is "NO", the following Medicare IM given date fields will be blank)  Per UR Regulation:  Reviewed for med. necessity/level of care/duration of stay  Comments:  02/14/2012- B Earlisha Sharples RN,BSN,MHA

## 2012-02-14 NOTE — Progress Notes (Signed)
Pt refuses to wear cpap tonight. He doesn't like the noise that comes from the exhalation port, so just wants to wear the nasal cannula.  Jacqulynn Cadet RRT

## 2012-02-14 NOTE — Progress Notes (Addendum)
[facility] PROGRESS NOTE  [name] ZOX:[id number] DOB: 1965-04-07 DOA: 02/13/2012 PCP: Provider Not In System  Assessment/Plan:  Progressive shortness of breath  - likely secondary to flareup of sarcoidosis versus acute bronchitis. Patient presented with hypoxia. However patient continues to be tachycardic with small amount of hemoptysis. No history of recent travel, fever. Has pleurisy. Continue IV steroid and scheduled and nebs. -Continue with Levaquin. Continue guaifenesin and  codeine for cough -Will get a CT angio of  the chest given his current symptoms rule out for an acute PE. EKG shows sinus tachycardia -Monitor fingersticks while on steroids  Pulmonary sarcoidosis On home O2 as needed. Not on steroids. Follows with Dr. Delton Coombes. Has appt on the 6th  OSA On CPAP at home which continued  Code Status: Full code Family Communication: None at bedside Disposition Plan: Home once stable   Consultants:  None  Procedures:  None  Antibiotics:   Levaquin  HPI/Subjective: Patient still short of breath maintaining sats on room air. However noted to be consistently tachycardic from 115 to 130. Has been coughing up small amount of blood stained sputum. Also complained of some chest discomfort with cough  Objective: Filed Vitals:   02/14/12 0446 02/14/12 0605 02/14/12 1008 02/14/12 1049  BP: 148/100 128/92  151/95  Pulse: 104 94  112  Temp:  97.8 F (36.6 C)  97.7 F (36.5 C)  TempSrc:  Oral  Oral  Resp:  20  20  Height:      Weight:  69.6 kg (153 lb 7 oz)    SpO2:  98% 97% 96%    Intake/Output Summary (Last 24 hours) at 02/14/12 1442 Last data filed at 02/14/12 1101  Gross per 24 hour  Intake    640 ml  Output    950 ml  Net   -310 ml   Filed Weights   02/13/12 2335 02/14/12 0605  Weight: 69.1 kg (152 lb 5.4 oz) 69.6 kg (153 lb 7 oz)    Exam:   General:  Middle-aged male lying in bed in no acute distress. Frequent coughing  HEENT: No pallor,  moist oral mucosa  Cardiovascular: S1 and S2 tachycardic. No murmurs rub gallop  Respiratory: Clear to auscultation bilaterally, no added sounds  Abdomen: Rough, nontender, nondistended, bowel sounds present  Extremities: Warm, no edema  CNS: AAO x3  Data Reviewed: Basic Metabolic Panel:  Lab 02/14/12 8119 02/13/12 1857  NA 135 140  K 4.2 3.7  CL 99 102  CO2 26 --  GLUCOSE 181* 87  BUN 9 6  CREATININE 0.85 1.20  CALCIUM 9.5 --  MG -- --  PHOS -- --   Liver Function Tests:  Lab 02/14/12 0500  AST 36  ALT 23  ALKPHOS 95  BILITOT 0.2*  PROT 7.6  ALBUMIN 3.2*   No results found for this basename: LIPASE:5,AMYLASE:5 in the last 168 hours No results found for this basename: AMMONIA:5 in the last 168 hours CBC:  Lab 02/14/12 0500 02/13/12 1857 02/13/12 1839  WBC 3.9* -- 5.1  NEUTROABS -- -- 3.3  HGB 12.1* 13.3 12.0*  HCT 37.8* 39.0 37.3*  MCV 79.7 -- 79.9  PLT 345 -- 315   Cardiac Enzymes: No results found for this basename: CKTOTAL:5,CKMB:5,CKMBINDEX:5,TROPONINI:5 in the last 168 hours BNP (last 3 results)  Basename 12/17/11 0110 04/11/11 2220  PROBNP 15.9 62.0   CBG:  Lab 02/14/12 1147 02/14/12 0753 02/14/12 0422 02/14/12 0105 02/13/12 2252  GLUCAP 178* 138* 173* 106* 95  No results found for this or any previous visit (from the past 240 hour(s)).   Studies: Dg Chest 2 View  02/13/2012  *RADIOLOGY REPORT*  Clinical Data: History of pulmonary sarcoidosis, now with worsening shortness of breath  CHEST - 2 VIEW  Comparison: 12/16/2011; 01/15/2011; 10/21/2010; chest CT - 04/12/2011  Findings:  Grossly unchanged cardiac silhouette and mediastinal contours. Extensive bilateral mid lung heterogeneous air space opacities are grossly unchanged, left greater than right.  Grossly stable findings of bullous change involving the right upper lobe with associated asymmetric right apical pleuroparenchymal thickening. Given extensive background parenchymal abnormalities,  there are no new focal airspace opacities.  No pleural effusion or pneumothorax. Unchanged bones including resection of the distal end of the right clavicle.  IMPRESSION: Grossly unchanged findings of extensive pulmonary sarcoidosis with associated opacities and architectural distortion without definite superimposed acute cardiopulmonary disease.   Original Report Authenticated By: Tacey Ruiz, MD     Scheduled Meds:   . albuterol  2.5 mg Nebulization Q4H  . azithromycin  250 mg Oral Daily  . calcium-vitamin D  1 tablet Oral Daily  . chlorthalidone  25 mg Oral Daily  . cholecalciferol  1,000 Units Oral Daily  . fluticasone  2 puff Inhalation BID  . guaiFENesin  600 mg Oral BID  . heparin  5,000 Units Subcutaneous Q8H  . insulin aspart  0-9 Units Subcutaneous Q4H  . ipratropium  0.5 mg Nebulization Q6H  . levofloxacin (LEVAQUIN) IV  750 mg Intravenous Q24H  . lidocaine  1 patch Transdermal Q24H  . methylPREDNISolone sodium succinate  125 mg Intravenous Q6H  . multivitamin with minerals  1 tablet Oral Daily  . omega-3 acid ethyl esters  1 g Oral BID  . pantoprazole  40 mg Oral Daily  . pregabalin  200 mg Oral BID  . sodium chloride  3 mL Intravenous Q12H  . sodium chloride  3 mL Intravenous Q12H  . vitamin C  250 mg Oral Daily   Continuous Infusions:     Time spent: 25 minutes    [name]  [facility] Pager [phone] If 8PM-8AM, please contact night-coverage at [email or web address], password [facility] 02/14/2012, 2:42 PM  LOS: 1 day

## 2012-02-14 NOTE — Progress Notes (Signed)
After midnight neb tx, cpap setup for pt per rx at home settings of 10cm h2o with full face mask as he wears this at home.  Pt stated he was not ready to go to bed yet or to wear cpap at that time.  RT offered to come back and assist with cpap, but pt stated he can place it on himself.  Pt was advised that RT here all night and encouraged him to call should he need assistance.  RN aware at bedside.  Sterile water was added to max fill line of humidity chamber.

## 2012-02-14 NOTE — Progress Notes (Signed)
Pt HR sustaining in 130s.  MD notified.

## 2012-02-15 ENCOUNTER — Telehealth: Payer: Self-pay | Admitting: Emergency Medicine

## 2012-02-15 DIAGNOSIS — I10 Essential (primary) hypertension: Secondary | ICD-10-CM

## 2012-02-15 LAB — INFLUENZA PANEL BY PCR (TYPE A & B)
H1N1 flu by pcr: NOT DETECTED
Influenza A By PCR: NEGATIVE
Influenza B By PCR: NEGATIVE

## 2012-02-15 LAB — GLUCOSE, CAPILLARY
Glucose-Capillary: 173 mg/dL — ABNORMAL HIGH (ref 70–99)
Glucose-Capillary: 183 mg/dL — ABNORMAL HIGH (ref 70–99)

## 2012-02-15 MED ORDER — LEVOFLOXACIN 750 MG PO TABS
750.0000 mg | ORAL_TABLET | Freq: Every day | ORAL | Status: DC
  Administered 2012-02-15 – 2012-02-17 (×3): 750 mg via ORAL
  Filled 2012-02-15 (×4): qty 1

## 2012-02-15 MED ORDER — AMLODIPINE BESYLATE 5 MG PO TABS
5.0000 mg | ORAL_TABLET | Freq: Every day | ORAL | Status: DC
  Administered 2012-02-15: 5 mg via ORAL
  Filled 2012-02-15 (×2): qty 1

## 2012-02-15 MED ORDER — DILTIAZEM HCL 60 MG PO TABS
60.0000 mg | ORAL_TABLET | Freq: Four times a day (QID) | ORAL | Status: DC
  Administered 2012-02-15 – 2012-02-16 (×4): 60 mg via ORAL
  Filled 2012-02-15 (×7): qty 1

## 2012-02-15 MED ORDER — CHLORTHALIDONE 50 MG PO TABS
50.0000 mg | ORAL_TABLET | Freq: Every day | ORAL | Status: DC
  Administered 2012-02-15 – 2012-02-18 (×4): 50 mg via ORAL
  Filled 2012-02-15 (×4): qty 1

## 2012-02-15 MED ORDER — DILTIAZEM HCL 30 MG PO TABS
30.0000 mg | ORAL_TABLET | Freq: Four times a day (QID) | ORAL | Status: DC
  Administered 2012-02-15: 30 mg via ORAL
  Filled 2012-02-15 (×4): qty 1

## 2012-02-15 MED ORDER — SALINE SPRAY 0.65 % NA SOLN
1.0000 | NASAL | Status: DC | PRN
Start: 2012-02-15 — End: 2012-02-18
  Administered 2012-02-15: 1 via NASAL
  Filled 2012-02-15: qty 44

## 2012-02-15 MED ORDER — AMLODIPINE BESYLATE 5 MG PO TABS
5.0000 mg | ORAL_TABLET | Freq: Every day | ORAL | Status: DC
  Filled 2012-02-15: qty 1

## 2012-02-15 MED ORDER — AMLODIPINE BESYLATE 10 MG PO TABS
10.0000 mg | ORAL_TABLET | Freq: Every day | ORAL | Status: DC
  Filled 2012-02-15: qty 1

## 2012-02-15 MED ORDER — LEVALBUTEROL HCL 0.63 MG/3ML IN NEBU
0.6300 mg | INHALATION_SOLUTION | RESPIRATORY_TRACT | Status: DC
  Administered 2012-02-15 – 2012-02-16 (×6): 0.63 mg via RESPIRATORY_TRACT
  Filled 2012-02-15 (×13): qty 3

## 2012-02-15 NOTE — Progress Notes (Signed)
Pt wearing cpap when I arrived to give neb treatment. He said he decided to put it on around 0300.  Jacqulynn Cadet RRT

## 2012-02-15 NOTE — Telephone Encounter (Signed)
I will forward as an FYI to Dr. Delton Coombes, pt has been admitted to [facility].Marland Kitchen Carron Curie, CMA

## 2012-02-15 NOTE — Progress Notes (Signed)
Shift event: RN paged NP earlier 2/2 pt c/o tingling in hands and HA followed by some burning chest pain. BP high and pt had Hydralazine 10mg  x 2 and Metoprolol 5mg  once as well. EKG done, ASA given and pt placed on O2 2L and MSO4 given. NP to bedside at [facility].  S: Pt says he feels much better. CP, HA and tingling gone. No SOB. Says "you fixed me". Pt on Chlorthalidone at home but says he ran out at home and didn't get Rx refilled.  O: VS reviewed. O2 sats normal. appears well. In NAD. Smiling. A&O x 3. RRR. Resp effort normal. Neuro grossly intact.  A/P: 1. Chest pain episode likely 2/2 elevated BP. BP has come down some. Cont Chlorthalidone and add Norvasc. His BMP looks good, renal fx normal. Cont Hydralazine prn. No neuro deficits noted. Will cycle trops and cont to follow.  2. HTN with non compliance at home. See #1.  Jimmye Norman, NP [facility]

## 2012-02-15 NOTE — Progress Notes (Signed)
Pt states that he will put self on CPAP. He know how it works and will put on. Pt receives neb treatments throughout night so RT will check back with patient.

## 2012-02-15 NOTE — Progress Notes (Signed)
[facility] PROGRESS NOTE  Tanveer Brammer ZOX:[id number] DOB: 12/14/1965 DOA: 02/13/2012 PCP: Provider Not In System  Assessment/Plan:  Progressive shortness of breath  - likely secondary to flareup of sarcoidosis versus acute bronchitis/  bronchiectasis. Patient presented with hypoxia. However patient continues to be tachycardic with small amount of hemoptysis on 2/3 . No history of recent travel, fever. Has pleurisy. Continue IV steroid and scheduled and nebs.  -Continue with Levaquin. Continue guaifenesin and codeine for cough  -CT angio of the chest negative for PE but shows uncharged changes of sarcoidosis and bronchioectasis. EKG shows sinus tachycardia . -Monitor fingersticks while on steroids   Sinus tachycardia Unexplained. Possibly from underlying issue. Will check TSH and flu PCR. Added cardizem for rate control . switch albuterol to xopenex. If HR not controlled will get 2d ECHO and cardiology consult in am.  Chest pain on 2/3 overnight  appears atypical with pleurisy EKG and serial troponin negative.Noted for uncontrolled BP. patient is on chlorthalidone as outpatient but ran out of prescription recently. Have resumed it and added cardizem for rate control. He has been tachycardic since yesterday. 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 Not on steroids. Follows with Dr. Delton Coombes. Has appt on the 6th   OSA  On CPAP at home which continued   Code Status: Full code  Family Communication: None at bedside  Disposition Plan: Home once stable      Consultants: none Procedures:  none  Antibiotics:  levaquin ( day 2)  HPI/Subjective: C/o chest pain overnight with congestion. Troponin negative. EKG shows sinus tachycardia. CT angio done on 2/3 negative for PE.  Objective: Filed Vitals:   02/15/12 0444 02/15/12 0921 02/15/12 1011 02/15/12 1215  BP: 155/105  152/98   Pulse:      Temp:      TempSrc:      Resp:      Height:      Weight:      SpO2:   96%  98%    Intake/Output Summary (Last 24 hours) at 02/15/12 1305 Last data filed at 02/15/12 0900  Gross per 24 hour  Intake    510 ml  Output   2000 ml  Net  -1490 ml   Filed Weights   02/13/12 2335 02/14/12 0605 02/15/12 0437  Weight: 69.1 kg (152 lb 5.4 oz) 69.6 kg (153 lb 7 oz) 71 kg (156 lb 8.4 oz)    Exam:  General: Middle-aged male lying in bed in no acute distress. Frequent coughing  HEENT: No pallor, moist oral mucosa  Cardiovascular: S1 and S2 tachycardic. No murmurs rub gallop  Respiratory: Clear to auscultation bilaterally, no added sounds  Abdomen: Soft, nontender, nondistended, bowel sounds present  Extremities: Warm, no edema  CNS: AAO x3  *  Data Reviewed: Basic Metabolic Panel:  Lab 02/14/12 8119 02/13/12 1857  NA 135 140  K 4.2 3.7  CL 99 102  CO2 26 --  GLUCOSE 181* 87  BUN 9 6  CREATININE 0.85 1.20  CALCIUM 9.5 --  MG -- --  PHOS -- --   Liver Function Tests:  Lab 02/14/12 0500  AST 36  ALT 23  ALKPHOS 95  BILITOT 0.2*  PROT 7.6  ALBUMIN 3.2*   No results found for this basename: LIPASE:5,AMYLASE:5 in the last 168 hours No results found for this basename: AMMONIA:5 in the last 168 hours CBC:  Lab 02/14/12 0500 02/13/12 1857 02/13/12 1839  WBC 3.9* -- 5.1  NEUTROABS -- --  3.3  HGB 12.1* 13.3 12.0*  HCT 37.8* 39.0 37.3*  MCV 79.7 -- 79.9  PLT 345 -- 315   Cardiac Enzymes:  Lab 02/15/12 1127 02/15/12 0505 02/14/12 2345  CKTOTAL -- -- --  CKMB -- -- --  CKMBINDEX -- -- --  TROPONINI <0.30 <0.30 <0.30   BNP (last 3 results)  Basename 12/17/11 0110 04/11/11 2220  PROBNP 15.9 62.0   CBG:  Lab 02/15/12 1148 02/15/12 0807 02/15/12 0433 02/15/12 0022 02/14/12 1954  GLUCAP 183* 173* 127* 138* 185*    No results found for this or any previous visit (from the past 240 hour(s)).   Studies: Dg Chest 2 View  02/13/2012  *RADIOLOGY REPORT*  Clinical Data: History of pulmonary sarcoidosis, now with worsening shortness of  breath  CHEST - 2 VIEW  Comparison: 12/16/2011; 01/15/2011; 10/21/2010; chest CT - 04/12/2011  Findings:  Grossly unchanged cardiac silhouette and mediastinal contours. Extensive bilateral mid lung heterogeneous air space opacities are grossly unchanged, left greater than right.  Grossly stable findings of bullous change involving the right upper lobe with associated asymmetric right apical pleuroparenchymal thickening. Given extensive background parenchymal abnormalities, there are no new focal airspace opacities.  No pleural effusion or pneumothorax. Unchanged bones including resection of the distal end of the right clavicle.  IMPRESSION: Grossly unchanged findings of extensive pulmonary sarcoidosis with associated opacities and architectural distortion without definite superimposed acute cardiopulmonary disease.   Original Report Authenticated By: Tacey Ruiz, MD    Ct Angio Chest Pe W/cm &/or Wo Cm  02/14/2012  *RADIOLOGY REPORT*  Clinical Data: Progressive shortness of breath with tachycardia and pleurisy.  Sarcoid.  CT ANGIOGRAPHY CHEST  Technique:  Multidetector CT imaging of the chest using the standard protocol during bolus administration of intravenous contrast. Multiplanar reconstructed images including MIPs were obtained and reviewed to evaluate the vascular anatomy.  Contrast: 80mL OMNIPAQUE IOHEXOL 350 MG/ML SOLN  Comparison: 04/12/2011.  Findings: No pulmonary embolus. Calcified and noncalcified mediastinal and hilar lymph nodes are again seen measuring up to 2 cm short axis in the subcarinal station, as before.  No axillary adenopathy.  Bilateral gynecomastia.  Heart size normal.  No pericardial effusion.  There are several sub centimeter short axis juxta diaphragmatic lymph nodes as well.  Confluent perihilar soft tissue masses are seen with traction bronchiectasis, architectural distortion and perilymphatic nodularity in the lungs bilaterally.  Distribution and severity are unchanged.  Interval  clearing of previously seen ground-glass air space disease in the left upper lobe.  Cystic cavity or bullous lesion in the right upper lobe is unchanged.  No pleural fluid. Scattered adherent debris in the airway.  Incidental imaging of the upper abdomen shows no acute findings. No worrisome lytic or sclerotic lesions.  IMPRESSION:  1.  No pulmonary embolus. 2.  Mediastinal, hilar and pulmonary parenchymal changes of sarcoid, unchanged.   Original Report Authenticated By: Leanna Battles, M.D.     Scheduled Meds:   . calcium-vitamin D  1 tablet Oral Daily  . chlorthalidone  50 mg Oral Daily  . cholecalciferol  1,000 Units Oral Daily  . diltiazem  30 mg Oral Q6H  . fluticasone  2 puff Inhalation BID  . guaiFENesin  600 mg Oral BID  . heparin  5,000 Units Subcutaneous Q8H  . insulin aspart  0-9 Units Subcutaneous Q4H  . ipratropium  0.5 mg Nebulization Q6H  . levalbuterol  0.63 mg Nebulization Q4H  . levofloxacin (LEVAQUIN) IV  750 mg Intravenous Q24H  . lidocaine  1 patch Transdermal Q24H  . methylPREDNISolone sodium succinate  125 mg Intravenous Q6H  . multivitamin with minerals  1 tablet Oral Daily  . omega-3 acid ethyl esters  1 g Oral BID  . pantoprazole  40 mg Oral Daily  . pregabalin  200 mg Oral BID  . sodium chloride  3 mL Intravenous Q12H  . sodium chloride  3 mL Intravenous Q12H  . vitamin C  250 mg Oral Daily   Continuous Infusions:      Time spent: 25 minutes    Tayloranne Lekas  [facility] Pager [phone] If 8PM-8AM, please contact night-coverage at [email or web address], password [facility] 02/15/2012, 1:05 PM  LOS: 2 days

## 2012-02-15 NOTE — Progress Notes (Signed)
PHARMACIST - PHYSICIAN COMMUNICATION DR:   Dhungel CONCERNING: Antibiotic IV to Oral Route Change Policy  RECOMMENDATION: This patient is receiving Levaquin by the intravenous route.  Based on criteria approved by the Pharmacy and Therapeutics Committee, the antibiotic(s) is/are being converted to the equivalent oral dose form(s).   DESCRIPTION: These criteria include:  Patient being treated for a respiratory tract infection, urinary tract infection, or cellulitis  The patient is not neutropenic and does not exhibit a GI malabsorption state  The patient is eating (either orally or via tube) and/or has been taking other orally administered medications for a least 24 hours  The patient is improving clinically and has a Tmax < 100.5  If you have questions about this conversion, please contact the Pharmacy Department  []   [phone] )  [facility] []   [phone] )  [facility]  []   [phone] )  [facility] [x]   [phone] )  [facility]   Thanks! Haynes Hoehn, PharmD 02/15/2012 1:18 PM  Pager: [phone]

## 2012-02-16 DIAGNOSIS — G4733 Obstructive sleep apnea (adult) (pediatric): Secondary | ICD-10-CM

## 2012-02-16 LAB — GLUCOSE, CAPILLARY
Glucose-Capillary: 158 mg/dL — ABNORMAL HIGH (ref 70–99)
Glucose-Capillary: 172 mg/dL — ABNORMAL HIGH (ref 70–99)

## 2012-02-16 MED ORDER — SODIUM CHLORIDE 0.9 % IV SOLN
INTRAVENOUS | Status: DC
  Administered 2012-02-16 – 2012-02-17 (×3): via INTRAVENOUS

## 2012-02-16 MED ORDER — METOPROLOL TARTRATE 12.5 MG HALF TABLET
12.5000 mg | ORAL_TABLET | Freq: Two times a day (BID) | ORAL | Status: DC
  Administered 2012-02-16 – 2012-02-18 (×4): 12.5 mg via ORAL
  Filled 2012-02-16 (×5): qty 1

## 2012-02-16 MED ORDER — METHYLPREDNISOLONE SODIUM SUCC 125 MG IJ SOLR
60.0000 mg | Freq: Four times a day (QID) | INTRAMUSCULAR | Status: DC
  Administered 2012-02-16 – 2012-02-17 (×5): 60 mg via INTRAVENOUS
  Filled 2012-02-16 (×7): qty 0.96

## 2012-02-16 MED ORDER — LEVALBUTEROL HCL 0.63 MG/3ML IN NEBU
0.6300 mg | INHALATION_SOLUTION | Freq: Four times a day (QID) | RESPIRATORY_TRACT | Status: DC
  Administered 2012-02-16 – 2012-02-18 (×6): 0.63 mg via RESPIRATORY_TRACT
  Filled 2012-02-16 (×11): qty 3

## 2012-02-16 NOTE — Progress Notes (Signed)
[facility] PROGRESS NOTE  Daryon Remmert ZOX:[id number] DOB: [birth year] DOA: 02/13/2012 PCP: Provider Not In System  Assessment/Plan: Acute exacerbation sarcoidosis/bronchiectasis - likely secondary to flareup of sarcoidosis versus acute bronchitis/ bronchiectasis. Patient presented with hypoxia. However patient continues to be tachycardic with small amount of hemoptysis on 2/3 . No history of recent travel, fever. Has pleurisy. Continue IV steroid and scheduled and nebs.  -Continue with Levaquin. Continue guaifenesin and codeine for cough  -CT angio of the chest negative for PE but shows uncharged changes of sarcoidosis and bronchioectasis. EKG shows sinus tachycardia .  -Monitor fingersticks while on steroids  -Wean steroids Hyperglycemia -Likely due to intravenous steroids -Hba1C--6.2 Sinus tachycardia  -Patient states that he has a history of sinus tachycardia previously evaluated by cardiology at [facility] -Patient stated his resting heart rate between 94-104 -Influenza PCR negative- -switch albuterol to xopenex. -Check echocardiogram although suspect likely reactive to the patient's medical condition -I/O not accurate; will give fluid challenge -Discontinue diltiazem -Add low-dose beta blockade for elevated blood pressure -TSH- .342--> check free T4 Chest pain on 2/3 overnight  -appears atypical -EKGs and serial troponin negative. patient is on chlorthalidone as outpatient but ran out of prescription recently.  He has been tachycardic since yesterday.  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 Not on steroids. Follows with Dr. Delton Coombes. Has appt on the 6th  OSA  On CPAP at home which continued  Code Status: Full code  Family Communication: None at bedside  Disposition Plan: Home once stable      Antibiotics: Levofloxacin 02/13/2012>>>    Procedures/Studies: Dg Chest 2 View  02/13/2012  *RADIOLOGY REPORT*  Clinical Data: History of pulmonary sarcoidosis, now with  worsening shortness of breath  CHEST - 2 VIEW  Comparison: 12/16/2011; 01/15/2011; 10/21/2010; chest CT - 04/12/2011  Findings:  Grossly unchanged cardiac silhouette and mediastinal contours. Extensive bilateral mid lung heterogeneous air space opacities are grossly unchanged, left greater than right.  Grossly stable findings of bullous change involving the right upper lobe with associated asymmetric right apical pleuroparenchymal thickening. Given extensive background parenchymal abnormalities, there are no new focal airspace opacities.  No pleural effusion or pneumothorax. Unchanged bones including resection of the distal end of the right clavicle.  IMPRESSION: Grossly unchanged findings of extensive pulmonary sarcoidosis with associated opacities and architectural distortion without definite superimposed acute cardiopulmonary disease.   Original Report Authenticated By: Tacey Ruiz, MD    Ct Angio Chest Pe W/cm &/or Wo Cm  02/14/2012  *RADIOLOGY REPORT*  Clinical Data: Progressive shortness of breath with tachycardia and pleurisy.  Sarcoid.  CT ANGIOGRAPHY CHEST  Technique:  Multidetector CT imaging of the chest using the standard protocol during bolus administration of intravenous contrast. Multiplanar reconstructed images including MIPs were obtained and reviewed to evaluate the vascular anatomy.  Contrast: 80mL OMNIPAQUE IOHEXOL 350 MG/ML SOLN  Comparison: 04/12/2011.  Findings: No pulmonary embolus. Calcified and noncalcified mediastinal and hilar lymph nodes are again seen measuring up to 2 cm short axis in the subcarinal station, as before.  No axillary adenopathy.  Bilateral gynecomastia.  Heart size normal.  No pericardial effusion.  There are several sub centimeter short axis juxta diaphragmatic lymph nodes as well.  Confluent perihilar soft tissue masses are seen with traction bronchiectasis, architectural distortion and perilymphatic nodularity in the lungs bilaterally.  Distribution and severity  are unchanged.  Interval clearing of previously seen ground-glass air space disease in the left upper lobe.  Cystic cavity or bullous lesion in the right  upper lobe is unchanged.  No pleural fluid. Scattered adherent debris in the airway.  Incidental imaging of the upper abdomen shows no acute findings. No worrisome lytic or sclerotic lesions.  IMPRESSION:  1.  No pulmonary embolus. 2.  Mediastinal, hilar and pulmonary parenchymal changes of sarcoid, unchanged.   Original Report Authenticated By: Leanna Battles, M.D.          Subjective: Patient states that he is breathing 50% better. Denies fevers, chills, chest pain, dizziness, nausea, vomiting, diarrhea, abdominal pain, dysuria, hematuria. He is having some loose stools.  Objective: Filed Vitals:   02/16/12 0006 02/16/12 0609 02/16/12 0932 02/16/12 1202  BP: 150/90 149/99  139/95  Pulse:  94    Temp:  97.9 F (36.6 C)    TempSrc:  Axillary    Resp:  18    Height:      Weight:  71.5 kg (157 lb 10.1 oz)    SpO2:  98% 98%     Intake/Output Summary (Last 24 hours) at 02/16/12 1742 Last data filed at 02/16/12 0200  Gross per 24 hour  Intake      0 ml  Output    975 ml  Net   -975 ml   Weight change: 0.5 kg (1 lb 1.6 oz) Exam:   General:  Pt is alert, follows commands appropriately, not in acute distress  HEENT: No icterus, No thrush, No neck mass, [place]/AT  Cardiovascular: RRR, S1/S2, no rubs, no gallops  Respiratory: Scattered crackles without any wheezes. Good air movement.  Abdomen: Soft/+BS, non tender, non distended, no guarding  Extremities: No edema, No lymphangitis, No petechiae, No rashes, no synovitis  Data Reviewed: Basic Metabolic Panel:  Lab 02/14/12 4098 02/13/12 1857  NA 135 140  K 4.2 3.7  CL 99 102  CO2 26 --  GLUCOSE 181* 87  BUN 9 6  CREATININE 0.85 1.20  CALCIUM 9.5 --  MG -- --  PHOS -- --   Liver Function Tests:  Lab 02/14/12 0500  AST 36  ALT 23  ALKPHOS 95  BILITOT 0.2*  PROT  7.6  ALBUMIN 3.2*   No results found for this basename: LIPASE:5,AMYLASE:5 in the last 168 hours No results found for this basename: AMMONIA:5 in the last 168 hours CBC:  Lab 02/14/12 0500 02/13/12 1857 02/13/12 1839  WBC 3.9* -- 5.1  NEUTROABS -- -- 3.3  HGB 12.1* 13.3 12.0*  HCT 37.8* 39.0 37.3*  MCV 79.7 -- 79.9  PLT 345 -- 315   Cardiac Enzymes:  Lab 02/15/12 1127 02/15/12 0505 02/14/12 2345  CKTOTAL -- -- --  CKMB -- -- --  CKMBINDEX -- -- --  TROPONINI <0.30 <0.30 <0.30   BNP: No components found with this basename: POCBNP:5 CBG:  Lab 02/16/12 1620 02/16/12 1156 02/16/12 0740 02/16/12 0426 02/16/12 0013  GLUCAP 167* 172* 121* 130* 158*    No results found for this or any previous visit (from the past 240 hour(s)).   Scheduled Meds:   . calcium-vitamin D  1 tablet Oral Daily  . chlorthalidone  50 mg Oral Daily  . cholecalciferol  1,000 Units Oral Daily  . diltiazem  60 mg Oral Q6H  . fluticasone  2 puff Inhalation BID  . guaiFENesin  600 mg Oral BID  . heparin  5,000 Units Subcutaneous Q8H  . insulin aspart  0-9 Units Subcutaneous Q4H  . ipratropium  0.5 mg Nebulization Q6H  . levalbuterol  0.63 mg Nebulization Q4H  . levofloxacin  750  mg Oral Q2000  . lidocaine  1 patch Transdermal Q24H  . methylPREDNISolone sodium succinate  125 mg Intravenous Q6H  . multivitamin with minerals  1 tablet Oral Daily  . omega-3 acid ethyl esters  1 g Oral BID  . pantoprazole  40 mg Oral Daily  . pregabalin  200 mg Oral BID  . sodium chloride  3 mL Intravenous Q12H  . sodium chloride  3 mL Intravenous Q12H  . vitamin C  250 mg Oral Daily   Continuous Infusions:    Tamrah Victorino, DO  [facility] Pager [phone]  If 7PM-7AM, please contact night-coverage [email or web address] Password [email or web address] 02/16/2012, 5:42 PM   LOS: 3 days

## 2012-02-17 ENCOUNTER — Ambulatory Visit: Payer: 59 | Admitting: Emergency Medicine

## 2012-02-17 DIAGNOSIS — J40 Bronchitis, not specified as acute or chronic: Secondary | ICD-10-CM

## 2012-02-17 DIAGNOSIS — R7309 Other abnormal glucose: Secondary | ICD-10-CM

## 2012-02-17 DIAGNOSIS — R000 Tachycardia, unspecified: Secondary | ICD-10-CM

## 2012-02-17 DIAGNOSIS — T50904A Poisoning by unspecified drugs, medicaments and biological substances, undetermined, initial encounter: Secondary | ICD-10-CM

## 2012-02-17 LAB — GLUCOSE, CAPILLARY
Glucose-Capillary: 114 mg/dL — ABNORMAL HIGH (ref 70–99)
Glucose-Capillary: 128 mg/dL — ABNORMAL HIGH (ref 70–99)
Glucose-Capillary: 148 mg/dL — ABNORMAL HIGH (ref 70–99)

## 2012-02-17 LAB — BASIC METABOLIC PANEL
BUN: 19 mg/dL (ref 6–23)
CO2: 27 mEq/L (ref 19–32)
Chloride: 99 mEq/L (ref 96–112)
GFR calc Af Amer: >90 mL/min (ref >90)
Glucose, Bld: 129 mg/dL — ABNORMAL HIGH (ref 70–99)
Potassium: 3.8 mEq/L (ref 3.5–5.1)

## 2012-02-17 LAB — CBC WITH DIFFERENTIAL/PLATELET
Basophils Relative: 0 % (ref 0–1)
HCT: 37.1 % — ABNORMAL LOW (ref 39.0–52.0)
Hemoglobin: 11.8 g/dL — ABNORMAL LOW (ref 13.0–17.0)
Lymphocytes Relative: 4 % — ABNORMAL LOW (ref 12–46)
MCHC: 31.8 g/dL (ref 30.0–36.0)
Monocytes Absolute: 0.2 10*3/uL (ref 0.1–1.0)
Monocytes Relative: 2 % — ABNORMAL LOW (ref 3–12)
Neutro Abs: 13.9 10*3/uL — ABNORMAL HIGH (ref 1.7–7.7)

## 2012-02-17 MED ORDER — PREDNISONE 50 MG PO TABS
60.0000 mg | ORAL_TABLET | Freq: Two times a day (BID) | ORAL | Status: DC
  Administered 2012-02-18: 60 mg via ORAL
  Filled 2012-02-17 (×3): qty 1

## 2012-02-17 NOTE — Telephone Encounter (Signed)
Thank you :)

## 2012-02-17 NOTE — Progress Notes (Signed)
Pt.stated that he likes to put self on CPAP. Pt had treatment for 2AM and when RT arrived pt was wearing CPAP.

## 2012-02-17 NOTE — Progress Notes (Signed)
  Echocardiogram 2D Echocardiogram has been performed.  Lilit Cinelli 02/17/2012, 3:32 PM

## 2012-02-17 NOTE — Discharge Summary (Signed)
Physician Discharge Summary  [name] ZOX:[id number] DOB: May 15, 1965 DOA: 02/13/2012  PCP: Provider Not In System  Admit date: 02/13/2012 Discharge date: 02/17/2012  Recommendations for Outpatient Follow-up:  1. Pt will need to follow up with PCP in 2 weeks post discharge 2. Please obtain BMP to evaluate electrolytes and kidney function 3. Please also check CBC to evaluate Hg and Hct levels 4. Reschedule appointment with Dr. Delton Coombes  Discharge Diagnoses:  Acute exacerbation sarcoidosis/bronchiectasis  - likely secondary to flareup of sarcoidosis versus acute bronchitis/ bronchiectasis. Patient presented with hypoxia. However patient continues to be tachycardic with small amount of hemoptysis on 2/3 .  -hemoptysis likely due to bronchiectasis--resolved  -Continue with Levaquin. Continue guaifenesin and codeine for cough  -CT angio of the chest negative for PE but shows uncharged changes of sarcoidosis and bronchioectasis. EKG shows sinus tachycardia .  -Wean steroids--discontinue IV Solu-Medrol, start by mouth prednisone  - prednisone 60 mg twice a day x1 day, 40 mg twice a day times one day, 20 mg twice a day x1 day, then 20 mg daily x1 day -Levaquin 750 mg daily x2 days Hyperglycemia  -Likely due to intravenous steroids  -HbA1C--6.2  Sinus tachycardia  -Patient states that he has a history of sinus tachycardia previously evaluated by cardiology at [facility]  -Patient stated his resting heart rate between 94-104  -Influenza PCR negative-  -switch albuterol to xopenex.  -Check echocardiogram although suspect likely reactive to the patient's medical condition  -I/O not accurate; will give fluid challenge  -Discontinue diltiazem  -Add low-dose beta blockade for elevated blood pressure  -TSH- 0.342--> check free T4-->0.62  Chest pain on 2/3 overnight  -appears atypical  -EKGs and serial troponin negative.  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 Not on steroids. Follows with Dr.  Delton Coombes. Has appt on the 6th  OSA  On CPAP at home which continued  Hypertension  -Continue chlorthalidone--patient was noncompliant with therapy, did not take it daily  -Continue metoprolol tartrate  Code Status: Full code  Family Communication: Significant other at bedside  Disposition Plan: Home once stable  Antibiotics:  Levofloxacin 02/13/2012>>>02/18/12   Discharge Condition: stable  Disposition:  Follow-up Information    Follow up with Provider Not In System.      Follow up with Leslye Peer., MD. On 02/17/2012. (has appointment already)    Contact information:   [address] [place] [address] [phone]          Diet:carb modified Wt Readings from Last 3 Encounters:  02/17/12 73.4 kg (161 lb 13.1 oz)  01/06/12 70.398 kg (155 lb 3.2 oz)  12/17/11 69.6 kg (153 lb 7 oz)    History of present illness:  47 y.o. year old male with significant past medical history of sarcoidosis presenting with cough, shortness of breath, wheezing.  Patient states symptoms have been present for the past 3-4 days. Patient states he had progressive increased work of breathing, wheezing, shortness of breath it was not improved with when necessary albuterol at home. Patient does use supplemental oxygen at home and has been having uses on a more scheduled basis secondary increased work of breathing. Patient also reports generalized malaise and body aches. Patient has had his flu shot this season. No nausea vomiting. No headache. Patient states that he woke up this morning and he felt like he couldn't breathe without taking obese to 3 puffs of his albuterol inhaler as well as wearing a supplemental oxygen. Patient also wears CPAP. Patient has been  compliant with this.  In ED patient is noted to be moderately hypoxic with O2 sats in the upper 80s off of oxygen.   Hospital Course:  The patient was started on intravenous Solu-Medrol as well as around-the-clock bronchodilators. The patient did  have a scant amount hemoptysis which subsequently resolved. This was thought to be due to his bronchiectasis. CT angiogram of the chest was obtained and was negative for pulmonary embolus, but showed unchanged changes consistent with sarcoidosis and bronchiectasis. The patient was maintained on supplemental oxygen during the hospitalization. He gradually improved. His intravenous steroids were weaned gradually. Patient was also maintained on levofloxacin during hospitalization. He finished 5 days. The patient will be discharged home on prednisone taper for another week. During the hospitalization, the patient was noted to have sinus tachycardia. The patient stated that he was previously evaluated at [facility] by cardiology. He stated he had a negative echocardiogram previously. This was over 5 years ago. Influenza PCR was checked and was negative. It was noted that the patient had poorly controlled blood pressure during the hospitalization. In addition to his chlorthalidone, the patient was started on metoprolol tartrate and given a fluid challenge. This helped improve his tachycardia. EKG is consistent with sinus tachycardia without any ST-T wave changes. TSH was 0.34 to and free T4 was 0.62. These abnormalities were thought to be due to the patient's steroid use. He will need to have repeat thyroid function studies when the patient is clinically stable off of steroids. The patient did have atypical chest discomfort during hospitalization. Troponins were negative. Echocardiogram was obtained andShowed ejection fraction 65-70% with grade 1 diastolic dysfunction. There was no abnormal wall motion.  During the hospitalization, the patient was also maintained on his usual CPAP settings. The patient tolerated without any difficulty. The patient did have some mild hyperglycemia. Hemoglobin A1c was checked and it was 6.2. This was thought to be due to the patient's chronic steroid use. The patient is to follow  up with his primary care provider regarding future diagnostic and therapeutic options.    Discharge Exam: Filed Vitals:   02/17/12 1300  BP: 137/95  Pulse: 97  Temp: 98.2 F (36.8 C)  Resp: 17   Filed Vitals:   02/17/12 0125 02/17/12 0500 02/17/12 0801 02/17/12 1300  BP:  150/111  137/95  Pulse: 89 99  97  Temp:  98 F (36.7 C)  98.2 F (36.8 C)  TempSrc:  Oral  Oral  Resp: 18 20  17   Height:      Weight:  73.4 kg (161 lb 13.1 oz)    SpO2: 99% 98% 93% 97%   General: A&O x 3, NAD, pleasant, cooperative Cardiovascular: RRR, no rub, no gallop, no S3 Respiratory: CTAB, no wheeze, no rhonchi Abdomen:soft, nontender, nondistended, positive bowel sounds Extremities: No edema, No lymphangitis, no petechiae  Discharge Instructions  Discharge Orders    Future Appointments: Provider: Department: Dept Phone: Center:   05/08/2012 2:00 PM [facility] [address] [facility] [address] [phone] [facility]       Medication List     As of 02/17/2012  6:02 PM    TAKE these medications         albuterol 108 (90 BASE) MCG/ACT inhaler   Commonly known as: PROVENTIL HFA;VENTOLIN HFA   Inhale 2 puffs into the lungs every 6 (six) hours as needed. Wheezing      amitriptyline 25 MG tablet   Commonly known as: ELAVIL   Take 25 mg  by mouth at bedtime as needed. For sleep      beclomethasone 80 MCG/ACT inhaler   Commonly known as: QVAR   Inhale 2 puffs into the lungs 2 (two) times daily.      CALCIUM + D PO   Take 1 tablet by mouth daily.      chlorthalidone 25 MG tablet   Commonly known as: HYGROTON   Take 25 mg by mouth daily.      cholecalciferol 1000 UNITS tablet   Commonly known as: VITAMIN D   Take 1,000 Units by mouth daily.      Cod Liver Oil Caps   Take 1 capsule by mouth daily.      cyclobenzaprine 10 MG tablet   Commonly known as: FLEXERIL   Take 10 mg by mouth 3 (three) times daily as needed. Muscle spasms.      diphenhydrAMINE 25 MG tablet    Commonly known as: BENADRYL   Take 25 mg by mouth every 6 (six) hours as needed. Allergies      guaiFENesin 600 MG 12 hr tablet   Commonly known as: MUCINEX   Take 600-1,200 mg by mouth 2 (two) times daily as needed. For congestion.      guaiFENesin 600 MG 12 hr tablet   Commonly known as: MUCINEX   Take 1 tablet (600 mg total) by mouth 2 (two) times daily.      levofloxacin 750 MG tablet   Commonly known as: LEVAQUIN   Take 1 tablet (750 mg total) by mouth daily.      lidocaine 5 %   Commonly known as: LIDODERM   Place 1 patch onto the skin daily. Remove & Discard patch within 12 hours or as directed by MD      multivitamin with minerals tablet   Take 1 tablet by mouth daily.      omeprazole 20 MG capsule   Commonly known as: PRILOSEC   Take 20 mg by mouth daily.      predniSONE 20 MG tablet   Commonly known as: DELTASONE   Take 50 mg po daily for 1 days , then 40 mg daily for 1 days then , 30 mg daily for 1 day, then 20 mg daily for 2 days, then 10 mg daily for 2 days and then stop.      pregabalin 200 MG capsule   Commonly known as: LYRICA   Take 200 mg by mouth 2 (two) times daily.      traZODone 100 MG tablet   Commonly known as: DESYREL   Take 100 mg by mouth at bedtime as needed. sleep      vardenafil 10 MG tablet   Commonly known as: LEVITRA   Take 10 mg by mouth as needed. E.D.      vitamin C 250 MG tablet   Commonly known as: ASCORBIC ACID   Take 250 mg by mouth daily.         The results of significant diagnostics from this hospitalization (including imaging, microbiology, ancillary and laboratory) are listed below for reference.    Significant Diagnostic Studies: Dg Chest 2 View  02/13/2012  *RADIOLOGY REPORT*  Clinical Data: History of pulmonary sarcoidosis, now with worsening shortness of breath  CHEST - 2 VIEW  Comparison: 12/16/2011; 01/15/2011; 10/21/2010; chest CT - 04/12/2011  Findings:  Grossly unchanged cardiac silhouette and mediastinal  contours. Extensive bilateral mid lung heterogeneous air space opacities are grossly unchanged, left greater than right.  Grossly stable findings  of bullous change involving the right upper lobe with associated asymmetric right apical pleuroparenchymal thickening. Given extensive background parenchymal abnormalities, there are no new focal airspace opacities.  No pleural effusion or pneumothorax. Unchanged bones including resection of the distal end of the right clavicle.  IMPRESSION: Grossly unchanged findings of extensive pulmonary sarcoidosis with associated opacities and architectural distortion without definite superimposed acute cardiopulmonary disease.   Original Report Authenticated By: Tacey Ruiz, MD    Ct Angio Chest Pe W/cm &/or Wo Cm  02/14/2012  *RADIOLOGY REPORT*  Clinical Data: Progressive shortness of breath with tachycardia and pleurisy.  Sarcoid.  CT ANGIOGRAPHY CHEST  Technique:  Multidetector CT imaging of the chest using the standard protocol during bolus administration of intravenous contrast. Multiplanar reconstructed images including MIPs were obtained and reviewed to evaluate the vascular anatomy.  Contrast: 80mL OMNIPAQUE IOHEXOL 350 MG/ML SOLN  Comparison: 04/12/2011.  Findings: No pulmonary embolus. Calcified and noncalcified mediastinal and hilar lymph nodes are again seen measuring up to 2 cm short axis in the subcarinal station, as before.  No axillary adenopathy.  Bilateral gynecomastia.  Heart size normal.  No pericardial effusion.  There are several sub centimeter short axis juxta diaphragmatic lymph nodes as well.  Confluent perihilar soft tissue masses are seen with traction bronchiectasis, architectural distortion and perilymphatic nodularity in the lungs bilaterally.  Distribution and severity are unchanged.  Interval clearing of previously seen ground-glass air space disease in the left upper lobe.  Cystic cavity or bullous lesion in the right upper lobe is unchanged.  No  pleural fluid. Scattered adherent debris in the airway.  Incidental imaging of the upper abdomen shows no acute findings. No worrisome lytic or sclerotic lesions.  IMPRESSION:  1.  No pulmonary embolus. 2.  Mediastinal, hilar and pulmonary parenchymal changes of sarcoid, unchanged.   Original Report Authenticated By: Leanna Battles, M.D.      Microbiology: No results found for this or any previous visit (from the past 240 hour(s)).   Labs: Basic Metabolic Panel:  Lab 02/17/12 9604 02/14/12 0500 02/13/12 1857  NA 135 135 140  K 3.8 4.2 --  CL 99 99 102  CO2 27 26 --  GLUCOSE 129* 181* 87  BUN 19 9 6   CREATININE 0.93 0.85 1.20  CALCIUM 8.9 9.5 --  MG -- -- --  PHOS -- -- --   Liver Function Tests:  Lab 02/14/12 0500  AST 36  ALT 23  ALKPHOS 95  BILITOT 0.2*  PROT 7.6  ALBUMIN 3.2*   No results found for this basename: LIPASE:5,AMYLASE:5 in the last 168 hours No results found for this basename: AMMONIA:5 in the last 168 hours CBC:  Lab 02/17/12 0425 02/14/12 0500 02/13/12 1857 02/13/12 1839  WBC 14.7* 3.9* -- 5.1  NEUTROABS 13.9* -- -- 3.3  HGB 11.8* 12.1* 13.3 12.0*  HCT 37.1* 37.8* 39.0 37.3*  MCV 80.1 79.7 -- 79.9  PLT 373 345 -- 315   Cardiac Enzymes:  Lab 02/15/12 1127 02/15/12 0505 02/14/12 2345  CKTOTAL -- -- --  CKMB -- -- --  CKMBINDEX -- -- --  TROPONINI <0.30 <0.30 <0.30   BNP: No components found with this basename: POCBNP:5 CBG:  Lab 02/17/12 1650 02/17/12 1140 02/17/12 0718 02/17/12 0418 02/16/12 2358  GLUCAP 131* 148* 128* 131* 114*    Time coordinating discharge:  Greater than 30 minutes  Signed:  Scotti Motter, DO [facility] Pager: [phone] 02/17/2012, 6:02 PM

## 2012-02-17 NOTE — Progress Notes (Signed)
[facility] PROGRESS NOTE  [name] JYN:[id number] DOB: May 03, 1965 DOA: 02/13/2012 PCP: Provider Not In System  Assessment/Plan: Acute exacerbation sarcoidosis/bronchiectasis  - likely secondary to flareup of sarcoidosis versus acute bronchitis/ bronchiectasis. Patient presented with hypoxia. However patient continues to be tachycardic with small amount of hemoptysis on 2/3 .  -hemoptysis likely due to bronchiectasis--resolved  -Continue with Levaquin. Continue guaifenesin and codeine for cough  -CT angio of the chest negative for PE but shows uncharged changes of sarcoidosis and bronchioectasis. EKG shows sinus tachycardia .  -Wean steroids--discontinue IV Solu-Medrol, start by mouth prednisone Hyperglycemia  -Likely due to intravenous steroids  -HbA1C--6.2  Sinus tachycardia  -Patient states that he has a history of sinus tachycardia previously evaluated by cardiology at [facility]  -Patient stated his resting heart rate between 94-104  -Influenza PCR negative-  -switch albuterol to xopenex.  -Check echocardiogram although suspect likely reactive to the patient's medical condition  -I/O not accurate; will give fluid challenge  -Discontinue diltiazem  -Add low-dose beta blockade for elevated blood pressure  -TSH- 0.342--> check free T4-->0.62 Chest pain on 2/3 overnight  -appears atypical  -EKGs and serial troponin negative.   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 Not on steroids. Follows with Dr. Delton Coombes. Has appt on the 6th  OSA  On CPAP at home which continued  Hypertension -Continue chlorthalidone--patient was noncompliant with therapy, did not take it daily -Continue metoprolol tartrate Code Status: Full code  Family Communication:  Significant other at bedside  Disposition Plan: Home once stable  Antibiotics:  Levofloxacin 02/13/2012>>>      Family Communication:   Pt at beside Disposition Plan:   Home when medically  stable      Procedures/Studies: Dg Chest 2 View  02/13/2012  *RADIOLOGY REPORT*  Clinical Data: History of pulmonary sarcoidosis, now with worsening shortness of breath  CHEST - 2 VIEW  Comparison: 12/16/2011; 01/15/2011; 10/21/2010; chest CT - 04/12/2011  Findings:  Grossly unchanged cardiac silhouette and mediastinal contours. Extensive bilateral mid lung heterogeneous air space opacities are grossly unchanged, left greater than right.  Grossly stable findings of bullous change involving the right upper lobe with associated asymmetric right apical pleuroparenchymal thickening. Given extensive background parenchymal abnormalities, there are no new focal airspace opacities.  No pleural effusion or pneumothorax. Unchanged bones including resection of the distal end of the right clavicle.  IMPRESSION: Grossly unchanged findings of extensive pulmonary sarcoidosis with associated opacities and architectural distortion without definite superimposed acute cardiopulmonary disease.   Original Report Authenticated By: Tacey Ruiz, MD    Ct Angio Chest Pe W/cm &/or Wo Cm  02/14/2012  *RADIOLOGY REPORT*  Clinical Data: Progressive shortness of breath with tachycardia and pleurisy.  Sarcoid.  CT ANGIOGRAPHY CHEST  Technique:  Multidetector CT imaging of the chest using the standard protocol during bolus administration of intravenous contrast. Multiplanar reconstructed images including MIPs were obtained and reviewed to evaluate the vascular anatomy.  Contrast: 80mL OMNIPAQUE IOHEXOL 350 MG/ML SOLN  Comparison: 04/12/2011.  Findings: No pulmonary embolus. Calcified and noncalcified mediastinal and hilar lymph nodes are again seen measuring up to 2 cm short axis in the subcarinal station, as before.  No axillary adenopathy.  Bilateral gynecomastia.  Heart size normal.  No pericardial effusion.  There are several sub centimeter short axis juxta diaphragmatic lymph nodes as well.  Confluent perihilar soft tissue masses  are seen with traction bronchiectasis, architectural distortion and perilymphatic nodularity in the lungs bilaterally.  Distribution and severity are unchanged.  Interval  clearing of previously seen ground-glass air space disease in the left upper lobe.  Cystic cavity or bullous lesion in the right upper lobe is unchanged.  No pleural fluid. Scattered adherent debris in the airway.  Incidental imaging of the upper abdomen shows no acute findings. No worrisome lytic or sclerotic lesions.  IMPRESSION:  1.  No pulmonary embolus. 2.  Mediastinal, hilar and pulmonary parenchymal changes of sarcoid, unchanged.   Original Report Authenticated By: Leanna Battles, M.D.          Subjective: Overall, patient states that he is breathing 75% better. Denies fevers, chills, chest pain, nausea, vomiting, diarrhea, abdominal pain, dysuria, hematuria, dizziness, syncope.  Objective: Filed Vitals:   02/17/12 0125 02/17/12 0500 02/17/12 0801 02/17/12 1300  BP:  150/111  137/95  Pulse: 89 99  97  Temp:  98 F (36.7 C)  98.2 F (36.8 C)  TempSrc:  Oral  Oral  Resp: 18 20  17   Height:      Weight:  73.4 kg (161 lb 13.1 oz)    SpO2: 99% 98% 93% 97%    Intake/Output Summary (Last 24 hours) at 02/17/12 1744 Last data filed at 02/17/12 0900  Gross per 24 hour  Intake 768.33 ml  Output    900 ml  Net -131.67 ml   Weight change: 1.9 kg (4 lb 3 oz) Exam:   General:  Pt is alert, follows commands appropriately, not in acute distress  HEENT: No icterus, No thrush,  [place]/AT  Cardiovascular: RRR, S1/S2, no rubs, no gallops  Respiratory: Diminished breath sounds at the bases. No wheezes. Good air movement.  Abdomen: Soft/+BS, non tender, non distended, no guarding  Extremities: No edema, No lymphangitis, No petechiae, No rashes, no synovitis  Data Reviewed: Basic Metabolic Panel:  Lab 02/17/12 1610 02/14/12 0500 02/13/12 1857  NA 135 135 140  K 3.8 4.2 3.7  CL 99 99 102  CO2 27 26 --  GLUCOSE  129* 181* 87  BUN 19 9 6   CREATININE 0.93 0.85 1.20  CALCIUM 8.9 9.5 --  MG -- -- --  PHOS -- -- --   Liver Function Tests:  Lab 02/14/12 0500  AST 36  ALT 23  ALKPHOS 95  BILITOT 0.2*  PROT 7.6  ALBUMIN 3.2*   No results found for this basename: LIPASE:5,AMYLASE:5 in the last 168 hours No results found for this basename: AMMONIA:5 in the last 168 hours CBC:  Lab 02/17/12 0425 02/14/12 0500 02/13/12 1857 02/13/12 1839  WBC 14.7* 3.9* -- 5.1  NEUTROABS 13.9* -- -- 3.3  HGB 11.8* 12.1* 13.3 12.0*  HCT 37.1* 37.8* 39.0 37.3*  MCV 80.1 79.7 -- 79.9  PLT 373 345 -- 315   Cardiac Enzymes:  Lab 02/15/12 1127 02/15/12 0505 02/14/12 2345  CKTOTAL -- -- --  CKMB -- -- --  CKMBINDEX -- -- --  TROPONINI <0.30 <0.30 <0.30   BNP: No components found with this basename: POCBNP:5 CBG:  Lab 02/17/12 1650 02/17/12 1140 02/17/12 0718 02/17/12 0418 02/16/12 2358  GLUCAP 131* 148* 128* 131* 114*    No results found for this or any previous visit (from the past 240 hour(s)).   Scheduled Meds:   . calcium-vitamin D  1 tablet Oral Daily  . chlorthalidone  50 mg Oral Daily  . cholecalciferol  1,000 Units Oral Daily  . fluticasone  2 puff Inhalation BID  . guaiFENesin  600 mg Oral BID  . heparin  5,000 Units Subcutaneous Q8H  . insulin  aspart  0-9 Units Subcutaneous Q4H  . ipratropium  0.5 mg Nebulization Q6H  . levalbuterol  0.63 mg Nebulization Q6H  . levofloxacin  750 mg Oral Q2000  . lidocaine  1 patch Transdermal Q24H  . methylPREDNISolone (SOLU-MEDROL) injection  60 mg Intravenous Q6H  . metoprolol tartrate  12.5 mg Oral Q12H  . multivitamin with minerals  1 tablet Oral Daily  . omega-3 acid ethyl esters  1 g Oral BID  . pantoprazole  40 mg Oral Daily  . pregabalin  200 mg Oral BID  . sodium chloride  3 mL Intravenous Q12H  . sodium chloride  3 mL Intravenous Q12H  . vitamin C  250 mg Oral Daily   Continuous Infusions:   . sodium chloride 100 mL/hr at 02/17/12 0843      Katryn Plummer, DO  [facility] Pager [phone]  If 7PM-7AM, please contact night-coverage [email or web address] Password [email or web address] 02/17/2012, 5:44 PM   LOS: 4 days

## 2012-02-18 LAB — BASIC METABOLIC PANEL
BUN: 19 mg/dL (ref 6–23)
Creatinine, Ser: 0.91 mg/dL (ref 0.50–1.35)
GFR calc Af Amer: >90 mL/min (ref >90)
GFR calc non Af Amer: >90 mL/min (ref >90)

## 2012-02-18 LAB — GLUCOSE, CAPILLARY
Glucose-Capillary: 119 mg/dL — ABNORMAL HIGH (ref 70–99)
Glucose-Capillary: 127 mg/dL — ABNORMAL HIGH (ref 70–99)

## 2012-02-18 MED ORDER — METOPROLOL TARTRATE 12.5 MG HALF TABLET: 12.5000 mg | ORAL_TABLET | Freq: Two times a day (BID) | ORAL | Status: DC

## 2012-02-18 MED ORDER — LEVOFLOXACIN 750 MG PO TABS: 750.0000 mg | ORAL_TABLET | Freq: Every day | ORAL | Status: DC

## 2012-02-18 MED ORDER — PREDNISONE 20 MG PO TABS: 60.0000 mg | ORAL_TABLET | Freq: Two times a day (BID) | ORAL | Status: DC

## 2012-02-18 NOTE — Plan of Care (Signed)
Pt did put CPAP on himself later in the night.  Cleotis Nipper, RN

## 2012-02-18 NOTE — Progress Notes (Signed)
Patient refuses CPAP at this time. He states that he doesn't want to fool with it tonight. RT instructed patient to call if he changed his mind or needed any assistance.

## 2012-02-28 ENCOUNTER — Emergency Department (HOSPITAL_COMMUNITY): Payer: 59

## 2012-02-28 ENCOUNTER — Emergency Department (HOSPITAL_COMMUNITY)
Admission: EM | Admit: 2012-02-28 | Discharge: 2012-02-28 | Disposition: A | Discharge status: Home / Self Care | Payer: 59 | Attending: Emergency Medicine | Admitting: Emergency Medicine

## 2012-02-28 ENCOUNTER — Emergency Department (HOSPITAL_COMMUNITY): Admission: EM | Admit: 2012-02-28 | Discharge: 2012-02-28 | Discharge status: Against Medical Advice | Payer: Self-pay

## 2012-02-28 ENCOUNTER — Encounter (HOSPITAL_COMMUNITY): Payer: Self-pay | Admitting: Emergency Medicine

## 2012-02-28 DIAGNOSIS — G4733 Obstructive sleep apnea (adult) (pediatric): Secondary | ICD-10-CM | POA: Insufficient documentation

## 2012-02-28 DIAGNOSIS — D869 Sarcoidosis, unspecified: Secondary | ICD-10-CM

## 2012-02-28 DIAGNOSIS — M7989 Other specified soft tissue disorders: Secondary | ICD-10-CM | POA: Insufficient documentation

## 2012-02-28 DIAGNOSIS — Z8701 Personal history of pneumonia (recurrent): Secondary | ICD-10-CM | POA: Insufficient documentation

## 2012-02-28 DIAGNOSIS — K594 Anal spasm: Secondary | ICD-10-CM

## 2012-02-28 DIAGNOSIS — IMO0002 Reserved for concepts with insufficient information to code with codable children: Secondary | ICD-10-CM | POA: Insufficient documentation

## 2012-02-28 DIAGNOSIS — K6289 Other specified diseases of anus and rectum: Secondary | ICD-10-CM | POA: Insufficient documentation

## 2012-02-28 DIAGNOSIS — Z9981 Dependence on supplemental oxygen: Secondary | ICD-10-CM | POA: Insufficient documentation

## 2012-02-28 DIAGNOSIS — R0602 Shortness of breath: Secondary | ICD-10-CM

## 2012-02-28 DIAGNOSIS — J9610 Chronic respiratory failure, unspecified whether with hypoxia or hypercapnia: Secondary | ICD-10-CM | POA: Insufficient documentation

## 2012-02-28 DIAGNOSIS — Z8709 Personal history of other diseases of the respiratory system: Secondary | ICD-10-CM | POA: Insufficient documentation

## 2012-02-28 DIAGNOSIS — K649 Unspecified hemorrhoids: Secondary | ICD-10-CM | POA: Insufficient documentation

## 2012-02-28 DIAGNOSIS — I10 Essential (primary) hypertension: Secondary | ICD-10-CM | POA: Insufficient documentation

## 2012-02-28 DIAGNOSIS — Z79899 Other long term (current) drug therapy: Secondary | ICD-10-CM | POA: Insufficient documentation

## 2012-02-28 LAB — BASIC METABOLIC PANEL
CO2: 27 mEq/L (ref 19–32)
Calcium: 8.5 mg/dL (ref 8.4–10.5)
Creatinine, Ser: 0.89 mg/dL (ref 0.50–1.35)
GFR calc non Af Amer: >90 mL/min (ref >90)
Sodium: 135 mEq/L (ref 135–145)

## 2012-02-28 LAB — CBC
MCH: 25.6 pg — ABNORMAL LOW (ref 26.0–34.0)
MCV: 81.4 fL (ref 78.0–100.0)
Platelets: 243 10*3/uL (ref 150–400)
RBC: 4.29 MIL/uL (ref 4.22–5.81)
RDW: 15.3 % (ref 11.5–15.5)
WBC: 12.8 10*3/uL — ABNORMAL HIGH (ref 4.0–10.5)

## 2012-02-28 MED ORDER — DOCUSATE SODIUM 100 MG PO CAPS: 100.0000 mg | ORAL_CAPSULE | Freq: Two times a day (BID) | ORAL | Status: DC

## 2012-02-28 MED ORDER — MORPHINE SULFATE 4 MG/ML IJ SOLN
4.0000 mg | Freq: Once | INTRAMUSCULAR | Status: AC
  Administered 2012-02-28: 4 mg via INTRAVENOUS
  Filled 2012-02-28: qty 1

## 2012-02-28 MED ORDER — HYDROCORTISONE ACETATE 25 MG RE SUPP: 25.0000 mg | Freq: Two times a day (BID) | RECTAL | Status: DC

## 2012-02-28 MED ORDER — AZITHROMYCIN 250 MG PO TABS: 250.0000 mg | ORAL_TABLET | Freq: Every day | ORAL | Status: DC

## 2012-02-28 MED ORDER — IOHEXOL 300 MG/ML  SOLN
50.0000 mL | Freq: Once | INTRAMUSCULAR | Status: AC | PRN
  Administered 2012-02-28: 50 mL via ORAL

## 2012-02-28 MED ORDER — HYDROCORTISONE ACETATE 25 MG RE SUPP
25.0000 mg | Freq: Once | RECTAL | Status: AC
  Administered 2012-02-28: 25 mg via RECTAL
  Filled 2012-02-28: qty 1

## 2012-02-28 MED ORDER — IOHEXOL 300 MG/ML  SOLN
100.0000 mL | Freq: Once | INTRAMUSCULAR | Status: AC | PRN
  Administered 2012-02-28: 100 mL via INTRAVENOUS

## 2012-02-28 MED ORDER — HYDROMORPHONE HCL PF 1 MG/ML IJ SOLN
1.0000 mg | INTRAMUSCULAR | Status: AC | PRN
  Administered 2012-02-28: 1 mg via INTRAVENOUS
  Filled 2012-02-28: qty 1

## 2012-02-28 MED ORDER — OXYCODONE-ACETAMINOPHEN 5-325 MG PO TABS: 2.0000 | ORAL_TABLET | Freq: Four times a day (QID) | ORAL | Status: DC | PRN

## 2012-02-28 MED ORDER — PREDNISONE 20 MG PO TABS: 40.0000 mg | ORAL_TABLET | Freq: Every day | ORAL | Status: DC

## 2012-02-28 NOTE — ED Notes (Signed)
Patient states he completed his contrast about one hour ago. Cory in CT notified.

## 2012-02-28 NOTE — ED Notes (Signed)
Pt presenting to ed with c/o rectal pain pt states history of hemorrhoids but states it does not feel the same. Pt states it feels like he has torn his rectum. Pt denies rectal bleeding at this time. Pt denies chest pain at this time pt states he was recently discharged from hospital last week for shortness of breath but today his breathing seems worse

## 2012-02-28 NOTE — ED Provider Notes (Signed)
History     CSN: [id number]  Arrival date & time 02/28/12  1437   First MD Initiated Contact with Patient 02/28/12 1612      Chief Complaint  Patient presents with  . Rectal Pain  . Shortness of Breath    (Consider location/radiation/quality/duration/timing/severity/associated sxs/prior treatment) HPI Comments: This is a 47 year old male, past medical history remarkable for sarcoidosis, chronic respiratory failure, and hypertension, who presents emergency department with chief complaint of shortness of breath and rectal pain. He states that he has constant shortness of breath, but that it is worsened over the past several days. He has not tried anything to alleviate the symptoms. She also complains of rectal pain, and is concerned that his torn his rectum. He denies any straining or hard bowel movements, but states that he has had frequent loose bowels. He states that his rectal pain is 10 out of 10, and that it hurts with sitting, standing, coughing, and was breathing. He denies fever, chest pain, nausea, and vomiting.  Also, patient states that he has had increased right lower extremity swelling, with pain in his calf, since his hospital stay. Patient states that he got Lovenox shots while in the hospital.  The history is provided by the patient. No language interpreter was used.    Past Medical History  Diagnosis Date  . Hypertension   . Sarcoidosis   . OSA on CPAP   . Chronic respiratory failure     hom oxygen PRN  . Bronchitis   . Pneumonia     Past Surgical History  Procedure Laterality Date  . Rotator cuff repair    . Arm surgery       Family History  Problem Relation Age of Onset  . Diabetes Father     History  Substance Use Topics  . Smoking status: Never Smoker   . Smokeless tobacco: Never Used  . Alcohol Use: Yes     Comment: social      Review of Systems  All other systems reviewed and are negative.    Allergies  Clindamycin/lincomycin  Home  Medications   Current Outpatient Rx  Name  Route  Sig  Dispense  Refill  . albuterol (PROVENTIL HFA;VENTOLIN HFA) 108 (90 BASE) MCG/ACT inhaler   Inhalation   Inhale 2 puffs into the lungs every 6 (six) hours as needed. Wheezing         . amitriptyline (ELAVIL) 25 MG tablet   Oral   Take 25 mg by mouth at bedtime. For sleep         . beclomethasone (QVAR) 80 MCG/ACT inhaler   Inhalation   Inhale 2 puffs into the lungs 2 (two) times daily.         . Calcium Carbonate-Vitamin D (CALCIUM + D PO)   Oral   Take 1 tablet by mouth every morning.          . chlorthalidone (HYGROTON) 25 MG tablet   Oral   Take 25 mg by mouth every morning.          . cholecalciferol (VITAMIN D) 1000 UNITS tablet   Oral   Take 1,000 Units by mouth every morning.          Marland Kitchen Cod Liver Oil CAPS   Oral   Take 1 capsule by mouth every morning.          . cyclobenzaprine (FLEXERIL) 10 MG tablet   Oral   Take 10 mg by mouth 3 (three) times daily as  needed. Muscle spasms.         . diphenhydrAMINE (BENADRYL) 25 MG tablet   Oral   Take 25 mg by mouth every 6 (six) hours as needed. Allergies         . guaiFENesin (MUCINEX) 600 MG 12 hr tablet   Oral   Take 1,200 mg by mouth 2 (two) times daily. For congestion.         . lidocaine (LIDODERM) 5 %   Transdermal   Place 1 patch onto the skin daily. Remove & Discard patch within 12 hours or as directed by MD         . metoprolol tartrate (LOPRESSOR) 12.5 mg TABS   Oral   Take 0.5 tablets (12.5 mg total) by mouth every 12 (twelve) hours.   60 tablet   0   . Multiple Vitamins-Minerals (MULTIVITAMIN WITH MINERALS) tablet   Oral   Take 1 tablet by mouth every morning.          Marland Kitchen omeprazole (PRILOSEC) 20 MG capsule   Oral   Take 20 mg by mouth daily.         . pregabalin (LYRICA) 200 MG capsule   Oral   Take 200 mg by mouth 2 (two) times daily.         . traZODone (DESYREL) 100 MG tablet   Oral   Take 100 mg by mouth  at bedtime. sleep         . vitamin C (ASCORBIC ACID) 250 MG tablet   Oral   Take 250 mg by mouth daily.           BP 142/92  Pulse 57  Temp(Src) 98.5 F (36.9 C) (Oral)  Resp 20  SpO2 100%  Physical Exam  Nursing note and vitals reviewed. Constitutional: He is oriented to person, place, and time. He appears well-developed and well-nourished.  HENT:  Head: Normocephalic and atraumatic.  Eyes: Conjunctivae and EOM are normal. Pupils are equal, round, and reactive to light. Right eye exhibits no discharge. Left eye exhibits no discharge. No scleral icterus.  Neck: Normal range of motion. Neck supple. No JVD present.  Cardiovascular: Normal rate, regular rhythm, normal heart sounds and intact distal pulses.  Exam reveals no gallop and no friction rub.   No murmur heard. Pulmonary/Chest: Effort normal and breath sounds normal. No respiratory distress. He has no wheezes. He has no rales. He exhibits no tenderness.  Abdominal: Soft. Bowel sounds are normal. He exhibits no distension and no mass. There is no tenderness. There is no rebound and no guarding.  Genitourinary:  No external hemorhoids, no visible fissures or masses, very tender to palpation.  Musculoskeletal: Normal range of motion. He exhibits no edema and no tenderness.  Neurological: He is alert and oriented to person, place, and time. He has normal reflexes.  CN 3-12 intact  Skin: Skin is warm and dry.  Psychiatric: He has a normal mood and affect. His behavior is normal. Judgment and thought content normal.    ED Course  Procedures (including critical care time)  Labs Reviewed  BASIC METABOLIC PANEL - Abnormal; Notable for the following:    Glucose, Bld 117 (*)    All other components within normal limits  CBC - Abnormal; Notable for the following:    WBC 12.8 (*)    Hemoglobin 11.0 (*)    HCT 34.9 (*)    MCH 25.6 (*)    All other components within normal limits   Results  for orders placed during the  hospital encounter of 02/28/12  BASIC METABOLIC PANEL      Result Value Range   Sodium 135  135 - 145 mEq/L   Potassium 4.8  3.5 - 5.1 mEq/L   Chloride 99  96 - 112 mEq/L   CO2 27  19 - 32 mEq/L   Glucose, Bld 117 (*) 70 - 99 mg/dL   BUN 12  6 - 23 mg/dL   Creatinine, Ser 1.30  0.50 - 1.35 mg/dL   Calcium 8.5  8.4 - 86.5 mg/dL   GFR calc non Af Amer >90  >90 mL/min   GFR calc Af Amer >90  >90 mL/min  CBC      Result Value Range   WBC 12.8 (*) 4.0 - 10.5 K/uL   RBC 4.29  4.22 - 5.81 MIL/uL   Hemoglobin 11.0 (*) 13.0 - 17.0 g/dL   HCT 78.4 (*) 69.6 - 29.5 %   MCV 81.4  78.0 - 100.0 fL   MCH 25.6 (*) 26.0 - 34.0 pg   MCHC 31.5  30.0 - 36.0 g/dL   RDW 28.4  13.2 - 44.0 %   Platelets 243  150 - 400 K/uL   Dg Chest 2 View  02/28/2012  *RADIOLOGY REPORT*  Clinical Data: Sarcoidosis.  Shortness of breath.  CHEST - 2 VIEW  Comparison: CT 02/14/2012.  Radiographs 02/13/2012.  Findings: The heart size and mediastinal contours are stable. There is extensive chronic lung disease with chronic perihilar pulmonary opacities bilaterally.  Based on the lateral view, there may be slight contraction of this process, although this difference may be positional.  There is no new airspace disease, significant pleural effusion or pneumothorax.  IMPRESSION: Grossly stable extensive chronic lung disease consistent with chronic sarcoidosis.  No acute process identified.   Original Report Authenticated By: Carey Bullocks, M.D.     Ct Pelvis W Contrast  02/28/2012  *RADIOLOGY REPORT*  Clinical Data:  Rectal pain, shortness of breath  CT PELVIS WITH CONTRAST  Technique:  Multidetector CT imaging of the pelvis was performed using the standard protocol following the bolus administration of intravenous contrast.  Contrast: 50mL OMNIPAQUE IOHEXOL 300 MG/ML  SOLN, OMNIPAQUE IOHEXOL 300 MG/ML  SOLN  Comparison:   None.  Findings:  Normal-caliber large and small bowel throughout the visualized lower abdomen and  anatomic pelvis.  A few scattered colonic diverticula are noted.  There is a moderate volume of stool in the visualized colon.  No intra-abdominal or pelvic ascites. The normal appendix can be identified in the right lower quadrant. The bladder is distended with urine.  Unremarkable prostate gland and seminal vesicles.  Nonspecific soft tissue fullness at the anal verge measures up to 3.4 cm in width.  No focal fluid collection to suggest abscess.  No significant inflammatory change in the ischiorectal fossae bilaterally.  No acute osseous abnormality.  No significant atherosclerotic vascular disease in the visualized vessels.  IMPRESSION:  1.  Nonspecific soft tissue fullness at the anal verge may represent hemorrhoidal tissue, or potentially a soft tissue mass. This should be amenable to direct inspection.  No focal fluid collection to suggest abscess and no adjacent inflammatory stranding in the ischiorectal fossae.  2.  Moderate volume of formed stool throughout the colon may suggest an element of underlying constipation.  3.  No evidence of bowel obstruction in the visualized lower abdomen and anatomic pelvis.   Original Report Authenticated By: Malachy Moan, M.D.  Author: Smiley Houseman, RVT Service: Vascular Lab Author Type: Cardiovascular Sonographer   Filed: 02/28/2012 5:23 PM Note Time: 02/28/2012 5:23 PM         Right: No evidence of DVT, superficial thrombosis, or Baker's cyst. Left: Negative for DVT in the common femoral vein.    ED ECG REPORT  I personally interpreted this EKG   Date: 02/28/2012   Rate: 119  Rhythm: sinus tachycardia  QRS Axis: normal  Intervals: normal  ST/T Wave abnormalities: normal  Conduction Disutrbances:none  Narrative Interpretation:   Old EKG Reviewed: unchanged    1. Anal spasm   2. Hemorrhoids   3. Sarcoid       MDM  This is a 47 year old male, who presents emergency department with chief complaint of shortness of breath rectal pain.  Patient believes that he might have "torn his rectum," I'm also suspicious of periorbital abscess. Discussed with Dr. Effie Shy, who tells me to order a pelvis CT, will treat the patient's pain with morphine. Also concern for DVT in right lower extremity.   6:10 PM Patient seen by and discussed with Dr. Effie Shy.  7:34 PM Patient states that his pain is moderately controlled but that it is still bothering him, I will give him a rectal suppository of hydrocodone, as well as 1 mg of Dilaudid. Duplex ultrasound was negative for DVT.  8:25 PM Discussed the patient and the lab results with Dr. Effie Shy.  We agree that the patient may be discharged with suppository, pain meds, and symptomatic care.  Patient is stable and ready for discharge.      Roxy Horseman, PA-C 02/28/12 2028  Roxy Horseman, PA-C 02/28/12 2041

## 2012-02-28 NOTE — Progress Notes (Signed)
Right:  No evidence of DVT, superficial thrombosis, or Baker's cyst.  Left:  Negative for DVT in the common femoral vein.  

## 2012-02-28 NOTE — ED Provider Notes (Signed)
[name] is a 47 y.o. male who is here for evaluation of shortness of breath, and anus pain. He had a normal bowel movement today. He, states he had hemorrhoids recently. No similar problem, in the past. No fever, chills, nausea, or vomiting.  Exam alert, calm, cooperative, in moderate pain. He prefers to lie prone secondary to the pain in his anus. Anus exam: Normal external architecture. He resists opening, the buttocks, and has severe pain with palpation of the anus. Digital exam deferred.      Medical screening examination/treatment/procedure(s) were conducted as a shared visit with non-physician practitioner(s) and myself.  I personally evaluated the patient during the encounter  Flint Melter, MD 02/28/12 2354

## 2012-03-02 ENCOUNTER — Ambulatory Visit: Payer: 59 | Admitting: Internal Medicine

## 2012-03-05 ENCOUNTER — Encounter (HOSPITAL_COMMUNITY): Payer: Self-pay | Admitting: Emergency Medicine

## 2012-03-05 ENCOUNTER — Inpatient Hospital Stay (HOSPITAL_COMMUNITY)
Admission: EM | Admit: 2012-03-05 | Discharge: 2012-03-08 | DRG: 191 | Disposition: A | Discharge status: Home / Self Care | Payer: 59 | Attending: Internal Medicine | Admitting: Internal Medicine

## 2012-03-05 ENCOUNTER — Emergency Department (HOSPITAL_COMMUNITY): Payer: 59

## 2012-03-05 DIAGNOSIS — I498 Other specified cardiac arrhythmias: Secondary | ICD-10-CM | POA: Diagnosis present

## 2012-03-05 DIAGNOSIS — R82998 Other abnormal findings in urine: Secondary | ICD-10-CM | POA: Diagnosis present

## 2012-03-05 DIAGNOSIS — R739 Hyperglycemia, unspecified: Secondary | ICD-10-CM | POA: Diagnosis present

## 2012-03-05 DIAGNOSIS — R000 Tachycardia, unspecified: Secondary | ICD-10-CM | POA: Diagnosis present

## 2012-03-05 DIAGNOSIS — R7309 Other abnormal glucose: Secondary | ICD-10-CM | POA: Diagnosis present

## 2012-03-05 DIAGNOSIS — I10 Essential (primary) hypertension: Secondary | ICD-10-CM | POA: Diagnosis present

## 2012-03-05 DIAGNOSIS — J189 Pneumonia, unspecified organism: Secondary | ICD-10-CM

## 2012-03-05 DIAGNOSIS — T50904A Poisoning by unspecified drugs, medicaments and biological substances, undetermined, initial encounter: Secondary | ICD-10-CM

## 2012-03-05 DIAGNOSIS — R079 Chest pain, unspecified: Secondary | ICD-10-CM | POA: Diagnosis present

## 2012-03-05 DIAGNOSIS — T380X5A Adverse effect of glucocorticoids and synthetic analogues, initial encounter: Secondary | ICD-10-CM | POA: Diagnosis present

## 2012-03-05 DIAGNOSIS — Z9981 Dependence on supplemental oxygen: Secondary | ICD-10-CM

## 2012-03-05 DIAGNOSIS — J441 Chronic obstructive pulmonary disease with (acute) exacerbation: Secondary | ICD-10-CM

## 2012-03-05 DIAGNOSIS — J209 Acute bronchitis, unspecified: Secondary | ICD-10-CM

## 2012-03-05 DIAGNOSIS — R0600 Dyspnea, unspecified: Secondary | ICD-10-CM

## 2012-03-05 DIAGNOSIS — T50905A Adverse effect of unspecified drugs, medicaments and biological substances, initial encounter: Secondary | ICD-10-CM

## 2012-03-05 DIAGNOSIS — J9610 Chronic respiratory failure, unspecified whether with hypoxia or hypercapnia: Secondary | ICD-10-CM | POA: Diagnosis present

## 2012-03-05 DIAGNOSIS — IMO0002 Reserved for concepts with insufficient information to code with codable children: Secondary | ICD-10-CM

## 2012-03-05 DIAGNOSIS — Z9119 Patient's noncompliance with other medical treatment and regimen: Secondary | ICD-10-CM

## 2012-03-05 DIAGNOSIS — G4733 Obstructive sleep apnea (adult) (pediatric): Secondary | ICD-10-CM | POA: Diagnosis present

## 2012-03-05 DIAGNOSIS — J471 Bronchiectasis with (acute) exacerbation: Principal | ICD-10-CM

## 2012-03-05 DIAGNOSIS — R062 Wheezing: Secondary | ICD-10-CM

## 2012-03-05 DIAGNOSIS — J40 Bronchitis, not specified as acute or chronic: Secondary | ICD-10-CM | POA: Diagnosis present

## 2012-03-05 DIAGNOSIS — I519 Heart disease, unspecified: Secondary | ICD-10-CM | POA: Diagnosis present

## 2012-03-05 DIAGNOSIS — J99 Respiratory disorders in diseases classified elsewhere: Secondary | ICD-10-CM | POA: Diagnosis present

## 2012-03-05 DIAGNOSIS — Z79899 Other long term (current) drug therapy: Secondary | ICD-10-CM

## 2012-03-05 DIAGNOSIS — Z9989 Dependence on other enabling machines and devices: Secondary | ICD-10-CM

## 2012-03-05 DIAGNOSIS — Z91199 Patient's noncompliance with other medical treatment and regimen due to unspecified reason: Secondary | ICD-10-CM

## 2012-03-05 DIAGNOSIS — D869 Sarcoidosis, unspecified: Secondary | ICD-10-CM | POA: Diagnosis present

## 2012-03-05 LAB — CBC
HCT: 30.1 % — ABNORMAL LOW (ref 39.0–52.0)
Hemoglobin: 9.7 g/dL — ABNORMAL LOW (ref 13.0–17.0)
MCHC: 32.2 g/dL (ref 30.0–36.0)

## 2012-03-05 LAB — POCT I-STAT, CHEM 8
Chloride: 109 mEq/L (ref 96–112)
Creatinine, Ser: 1.1 mg/dL (ref 0.50–1.35)
Glucose, Bld: 91 mg/dL (ref 70–99)
Potassium: 3.7 mEq/L (ref 3.5–5.1)

## 2012-03-05 LAB — RAPID URINE DRUG SCREEN, HOSP PERFORMED
Cocaine: POSITIVE — AB
Opiates: POSITIVE — AB

## 2012-03-05 LAB — TROPONIN I: Troponin I: <0.3 ng/mL (ref <0.30)

## 2012-03-05 MED ORDER — HYDROCORTISONE ACETATE 25 MG RE SUPP
25.0000 mg | Freq: Two times a day (BID) | RECTAL | Status: DC
  Administered 2012-03-05: 25 mg via RECTAL
  Filled 2012-03-05 (×2): qty 1

## 2012-03-05 MED ORDER — PANTOPRAZOLE SODIUM 40 MG PO TBEC
40.0000 mg | DELAYED_RELEASE_TABLET | Freq: Every day | ORAL | Status: DC
  Administered 2012-03-05 – 2012-03-08 (×4): 40 mg via ORAL
  Filled 2012-03-05 (×4): qty 1

## 2012-03-05 MED ORDER — METHYLPREDNISOLONE SODIUM SUCC 125 MG IJ SOLR
60.0000 mg | Freq: Four times a day (QID) | INTRAMUSCULAR | Status: DC
  Administered 2012-03-05 – 2012-03-06 (×5): 60 mg via INTRAVENOUS
  Filled 2012-03-05 (×8): qty 0.96

## 2012-03-05 MED ORDER — DOCUSATE SODIUM 100 MG PO CAPS
100.0000 mg | ORAL_CAPSULE | Freq: Two times a day (BID) | ORAL | Status: DC
  Administered 2012-03-05 – 2012-03-08 (×7): 100 mg via ORAL
  Filled 2012-03-05 (×8): qty 1

## 2012-03-05 MED ORDER — SODIUM CHLORIDE 0.9 % IV SOLN
INTRAVENOUS | Status: DC
  Administered 2012-03-05 – 2012-03-06 (×2): via INTRAVENOUS
  Filled 2012-03-05 (×2): qty 1000

## 2012-03-05 MED ORDER — ACETAMINOPHEN 325 MG PO TABS
650.0000 mg | ORAL_TABLET | Freq: Four times a day (QID) | ORAL | Status: DC | PRN
  Administered 2012-03-05: 650 mg via ORAL
  Filled 2012-03-05: qty 2

## 2012-03-05 MED ORDER — METOPROLOL TARTRATE 12.5 MG HALF TABLET
12.5000 mg | ORAL_TABLET | Freq: Two times a day (BID) | ORAL | Status: DC
  Administered 2012-03-05 – 2012-03-08 (×7): 12.5 mg via ORAL
  Filled 2012-03-05 (×8): qty 1

## 2012-03-05 MED ORDER — ONDANSETRON HCL 4 MG PO TABS: 4.0000 mg | ORAL_TABLET | Freq: Four times a day (QID) | ORAL | Status: DC | PRN

## 2012-03-05 MED ORDER — OMEGA-3-ACID ETHYL ESTERS 1 G PO CAPS
1.0000 g | ORAL_CAPSULE | Freq: Every day | ORAL | Status: DC
  Administered 2012-03-05 – 2012-03-06 (×2): 1 g via ORAL
  Filled 2012-03-05 (×3): qty 1

## 2012-03-05 MED ORDER — LIDOCAINE HCL 2 % EX GEL
Freq: Once | CUTANEOUS | Status: AC
  Administered 2012-03-05: 10 via TOPICAL
  Filled 2012-03-05: qty 10

## 2012-03-05 MED ORDER — CYCLOBENZAPRINE HCL 10 MG PO TABS
10.0000 mg | ORAL_TABLET | Freq: Three times a day (TID) | ORAL | Status: DC | PRN
  Filled 2012-03-05: qty 1

## 2012-03-05 MED ORDER — OXYCODONE HCL 5 MG PO TABS
5.0000 mg | ORAL_TABLET | ORAL | Status: DC | PRN
  Administered 2012-03-05 – 2012-03-07 (×6): 5 mg via ORAL
  Filled 2012-03-05 (×6): qty 1

## 2012-03-05 MED ORDER — ALBUTEROL SULFATE (5 MG/ML) 0.5% IN NEBU
5.0000 mg | INHALATION_SOLUTION | Freq: Once | RESPIRATORY_TRACT | Status: AC
  Administered 2012-03-05: 5 mg via RESPIRATORY_TRACT
  Filled 2012-03-05: qty 1

## 2012-03-05 MED ORDER — MULTI-VITAMIN/MINERALS PO TABS: 1.0000 | ORAL_TABLET | Freq: Every morning | ORAL | Status: DC

## 2012-03-05 MED ORDER — ENOXAPARIN SODIUM 40 MG/0.4ML ~~LOC~~ SOLN
40.0000 mg | SUBCUTANEOUS | Status: DC
  Administered 2012-03-05 – 2012-03-07 (×3): 40 mg via SUBCUTANEOUS
  Filled 2012-03-05 (×4): qty 0.4

## 2012-03-05 MED ORDER — VITAMIN C 250 MG PO TABS
250.0000 mg | ORAL_TABLET | Freq: Every morning | ORAL | Status: DC
  Administered 2012-03-05 – 2012-03-08 (×4): 250 mg via ORAL
  Filled 2012-03-05 (×4): qty 1

## 2012-03-05 MED ORDER — ACETAMINOPHEN 325 MG PO TABS
650.0000 mg | ORAL_TABLET | Freq: Once | ORAL | Status: AC
  Administered 2012-03-05: 650 mg via ORAL
  Filled 2012-03-05: qty 2

## 2012-03-05 MED ORDER — LIDOCAINE 5 % EX PTCH
1.0000 | MEDICATED_PATCH | CUTANEOUS | Status: DC
  Administered 2012-03-06 – 2012-03-07 (×2): 1 via TRANSDERMAL
  Filled 2012-03-05 (×4): qty 1

## 2012-03-05 MED ORDER — PREGABALIN 100 MG PO CAPS
200.0000 mg | ORAL_CAPSULE | Freq: Two times a day (BID) | ORAL | Status: DC
  Administered 2012-03-05 – 2012-03-08 (×7): 200 mg via ORAL
  Filled 2012-03-05 (×7): qty 2

## 2012-03-05 MED ORDER — ADULT MULTIVITAMIN W/MINERALS CH
1.0000 | ORAL_TABLET | Freq: Every day | ORAL | Status: DC
  Administered 2012-03-05 – 2012-03-08 (×4): 1 via ORAL
  Filled 2012-03-05 (×4): qty 1

## 2012-03-05 MED ORDER — COD LIVER OIL PO CAPS: 1.0000 | ORAL_CAPSULE | Freq: Every morning | ORAL | Status: DC

## 2012-03-05 MED ORDER — IPRATROPIUM BROMIDE 0.02 % IN SOLN
0.5000 mg | RESPIRATORY_TRACT | Status: DC
  Administered 2012-03-05 – 2012-03-06 (×7): 0.5 mg via RESPIRATORY_TRACT
  Filled 2012-03-05 (×7): qty 2.5

## 2012-03-05 MED ORDER — PREDNISONE 20 MG PO TABS
60.0000 mg | ORAL_TABLET | Freq: Once | ORAL | Status: AC
  Administered 2012-03-05: 60 mg via ORAL
  Filled 2012-03-05: qty 3

## 2012-03-05 MED ORDER — MORPHINE SULFATE 4 MG/ML IJ SOLN
4.0000 mg | Freq: Once | INTRAMUSCULAR | Status: AC
  Administered 2012-03-05: 4 mg via INTRAVENOUS
  Filled 2012-03-05: qty 1

## 2012-03-05 MED ORDER — ONDANSETRON HCL 4 MG/2ML IJ SOLN: 4.0000 mg | Freq: Four times a day (QID) | INTRAMUSCULAR | Status: DC | PRN

## 2012-03-05 MED ORDER — SODIUM CHLORIDE 0.9 % IJ SOLN
3.0000 mL | Freq: Two times a day (BID) | INTRAMUSCULAR | Status: DC
  Administered 2012-03-05: 3 mL via INTRAVENOUS

## 2012-03-05 MED ORDER — PREGABALIN 100 MG PO CAPS: 200.0000 mg | ORAL_CAPSULE | Freq: Two times a day (BID) | ORAL | Status: DC

## 2012-03-05 MED ORDER — PIPERACILLIN-TAZOBACTAM 3.375 G IVPB 30 MIN
3.3750 g | Freq: Three times a day (TID) | INTRAVENOUS | Status: DC
  Administered 2012-03-05 – 2012-03-07 (×7): 3.375 g via INTRAVENOUS
  Filled 2012-03-05 (×9): qty 50

## 2012-03-05 MED ORDER — ALBUTEROL SULFATE (5 MG/ML) 0.5% IN NEBU
2.5000 mg | INHALATION_SOLUTION | RESPIRATORY_TRACT | Status: DC
  Administered 2012-03-05 – 2012-03-06 (×7): 2.5 mg via RESPIRATORY_TRACT
  Filled 2012-03-05 (×7): qty 0.5

## 2012-03-05 MED ORDER — ACETAMINOPHEN 650 MG RE SUPP: 650.0000 mg | Freq: Four times a day (QID) | RECTAL | Status: DC | PRN

## 2012-03-05 MED ORDER — VITAMIN D3 25 MCG (1000 UNIT) PO TABS
1000.0000 [IU] | ORAL_TABLET | Freq: Every morning | ORAL | Status: DC
  Administered 2012-03-05 – 2012-03-08 (×4): 1000 [IU] via ORAL
  Filled 2012-03-05 (×4): qty 1

## 2012-03-05 MED ORDER — PNEUMOCOCCAL VAC POLYVALENT 25 MCG/0.5ML IJ INJ
0.5000 mL | INJECTION | INTRAMUSCULAR | Status: AC
  Administered 2012-03-06: 0.5 mL via INTRAMUSCULAR
  Filled 2012-03-05 (×2): qty 0.5

## 2012-03-05 MED ORDER — CALCIUM CARBONATE-VITAMIN D 500-200 MG-UNIT PO TABS
1.0000 | ORAL_TABLET | Freq: Every day | ORAL | Status: DC
  Administered 2012-03-05 – 2012-03-08 (×4): 1 via ORAL
  Filled 2012-03-05 (×5): qty 1

## 2012-03-05 MED ORDER — HYDROCORTISONE 2.5 % RE CREA
TOPICAL_CREAM | Freq: Two times a day (BID) | RECTAL | Status: DC
  Administered 2012-03-05 – 2012-03-06 (×2): via RECTAL
  Filled 2012-03-05: qty 28.35

## 2012-03-05 NOTE — ED Notes (Signed)
Pt c/o shob currently on 02, pt seen here 2/17 for same, now worse. Labored resp noted in triage, short sentences. L sided CP onset 2 hours.

## 2012-03-05 NOTE — ED Notes (Signed)
MD at bedside. 

## 2012-03-05 NOTE — ED Notes (Signed)
Patient returned from xray.

## 2012-03-05 NOTE — ED Notes (Signed)
Patient transported to X-ray 

## 2012-03-05 NOTE — H&P (Signed)
[facility] History and Physical  Joran Kallal ZOX:[id number] DOB: [birth year] DOA: 03/05/2012   PCP: Provider Not In System   Chief Complaint: sob  HPI:  47 year old male with a history of sarcoidosis, bronchiectasis, hypertension, and obstructive sleep apnea presents with a two-day history of worsening shortness of breath. The patient was recently discharged from [facility] on 02/17/2012 for a similar exacerbation of his sarcoidosis and bronchiectasis. The patient was discharged home with a prednisone wean over 4 days. He came back to the emergency department 02/28/2012 with shortness of breath and rectal pain. The patient was discharged from the emergency department at that time with prednisone wean over 5 days. CT of the abdomen and pelvis on February 17 revealed soft tissue fullness at the anal verge. The patient was given [facility] which she states improved his rectal pain. He denies any melena or hematochezia. There is no abdominal pain. There is no weight loss, and his appetite has been excellent. The patient has never had an endoscopy.  He came back to the emergency department today, 03/05/2012 because of continued shortness of breath. He finished his prednisone wean yesterday. He tried taking his home nebulizer and MDIs without improvement. He does complain of increasing cough with streaks of blood. He denies any actual fevers, but has some chills and sweats. He denies any nausea, vomiting, diarrhea, abdominal pain, hematochezia, melena, dysuria, hematuria. He does complain of some chest discomfort. In the emergency department, the patient was given albuterol neb and prednisone 60 mg. He states that he is minimally improved. Chest x-ray reveals chronic changes with bilateral lung opacities, emphysematous changes.  When I entered the room, the patient was sleeping soundly without any distress whatsoever. He woke up without difficulty and was alert and oriented. Oxygen saturation 98-99% on 2  L. Assessment/Plan: Acute exacerbation sarcoidosis/bronchiectasis/?COPD  -?tracheobronchitis -hemoptysis likely due to bronchiectasis--similar to previous admission -Start IV Solu-Medrol -Although low suspicion for superimposed pneumonia--we'll obtain blood cultures and start patient on empiric Zosyn given the patient's complaints of chills and sweats--> certainly this may be discontinued if his cultures are negative and patient clinically improves -CT angio of the chest negative for PE but shows uncharged changes of sarcoidosis and bronchioectasis on 02/14/2012 -Patient has been noncompliant with outpatient followup with Dr. Elwin Sleight is very evasive when asked if he made any attempts to schedule an appointment -Check urine drug screen Hyperglycemia  -Likely due to intravenous steroids  -HbA1C--6.2  Sinus tachycardia  -Patient states that he has a history of sinus tachycardia previously evaluated by cardiology at [facility]  -Patient stated his resting heart rate between 94-104  -Influenza PCR negative on the last admission -echocardiogram showed EF of 65-70%, grade 1 diastolic dysfunction, no wall motion abnormalities -Added low-dose metoprolol tartrate for HTN and tachycardia--he responded well without any worsening of his respiratory status -TSH- 0.342--> check free T4-->0.62  Rectal pain -I have personally performed rectal exam--->FOBT NEG -There is rectal fullness without mass on the inferior portion of rectum -May ultimately need sigmoidoscopy Chest pain -appears atypical  -EKGs negative.  -Cycle troponin Pulmonary sarcoidosis  On home O2-2L as needed. Not on steroids at baseline OSA  On CPAP at home which can be continued  Hypertension  -Continue chlorthalidone--patient was noncompliant with therapy, did not take it daily  -Continue metoprolol tartrate          Past Medical History  Diagnosis Date  . Hypertension   . Sarcoidosis   . OSA on CPAP   . Chronic  respiratory failure  hom oxygen PRN  . Bronchitis   . Pneumonia    Past Surgical History  Procedure Laterality Date  . Rotator cuff repair    . Arm surgery      Social History:  reports that he has never smoked. He has never used smokeless tobacco. He reports that  drinks alcohol. He reports that he does not use illicit drugs.   Family History  Problem Relation Age of Onset  . Diabetes Father      Allergies  Allergen Reactions  . Clindamycin/Lincomycin Swelling      Prior to Admission medications   Medication Sig Start Date End Date Taking? Authorizing Provider  albuterol (PROVENTIL HFA;VENTOLIN HFA) 108 (90 BASE) MCG/ACT inhaler Inhale 2 puffs into the lungs every 6 (six) hours as needed. Wheezing 04/13/11 04/12/12 Yes Estela Isaiah Blakes, MD  amitriptyline (ELAVIL) 25 MG tablet Take 25 mg by mouth at bedtime. For sleep   Yes Historical Provider, MD  beclomethasone (QVAR) 80 MCG/ACT inhaler Inhale 2 puffs into the lungs 2 (two) times daily. 01/06/12  Yes Leslye Peer, MD  calcium-vitamin D (OSCAL WITH D) 500-200 MG-UNIT per tablet Take 1 tablet by mouth every morning.   Yes Historical Provider, MD  chlorthalidone (HYGROTON) 25 MG tablet Take 25 mg by mouth every morning.    Yes Historical Provider, MD  cholecalciferol (VITAMIN D) 1000 UNITS tablet Take 1,000 Units by mouth every morning.    Yes Historical Provider, MD  [facility] Liver Oil CAPS Take 1 capsule by mouth every morning.    Yes Historical Provider, MD  cyclobenzaprine (FLEXERIL) 10 MG tablet Take 10 mg by mouth 3 (three) times daily as needed. Muscle spasms.   Yes Historical Provider, MD  diphenhydrAMINE (BENADRYL) 25 MG tablet Take 25 mg by mouth every 6 (six) hours as needed. Allergies   Yes Historical Provider, MD  docusate sodium (COLACE) 100 MG capsule Take 1 capsule (100 mg total) by mouth every 12 (twelve) hours. 02/28/12  Yes Roxy Horseman, PA-C  guaiFENesin (MUCINEX) 600 MG 12 hr tablet Take 1,200 mg by  mouth 2 (two) times daily. For congestion.   Yes Historical Provider, MD  hydrocortisone (ANUSOL-HC) 25 MG suppository Place 1 suppository (25 mg total) rectally 2 (two) times daily. 02/28/12  Yes Roxy Horseman, PA-C  lidocaine (LIDODERM) 5 % Place 1 patch onto the skin daily. Remove & Discard patch within 12 hours or as directed by MD   Yes Historical Provider, MD  metoprolol tartrate (LOPRESSOR) 12.5 mg TABS Take 0.5 tablets (12.5 mg total) by mouth every 12 (twelve) hours. 02/18/12  Yes Catarina Hartshorn, MD  Multiple Vitamins-Minerals (MULTIVITAMIN WITH MINERALS) tablet Take 1 tablet by mouth every morning.    Yes Historical Provider, MD  omeprazole (PRILOSEC) 20 MG capsule Take 20 mg by mouth every morning.    Yes Historical Provider, MD  pregabalin (LYRICA) 200 MG capsule Take 200 mg by mouth 2 (two) times daily.   Yes Historical Provider, MD  traZODone (DESYREL) 100 MG tablet Take 100 mg by mouth at bedtime. sleep   Yes Historical Provider, MD  vitamin C (ASCORBIC ACID) 250 MG tablet Take 250 mg by mouth every morning.    Yes Historical Provider, MD    Review of Systems:  Constitutional:  No weight loss, night sweats, chills, fatigue.  Head&Eyes: No headache.  No vision loss.  No eye pain or scotoma ENT:  No Difficulty swallowing,Tooth/dental problems,Sore throat,  No ear ache, post nasal drip,  Cardio-vascular:  No chest pain, Orthopnea, PND, swelling in lower extremities,  dizziness, palpitations  GI:  No  abdominal pain, nausea, vomiting, diarrhea, loss of appetite, hematochezia, melena, heartburn, indigestion, Resp:  Complains of blood-streaked coughing, wheezing, shortness of breath. Skin:  no rash or lesions.  GU:  no dysuria, change in color of urine, no urgency or frequency. No flank pain.  Musculoskeletal:  No joint pain or swelling. No decreased range of motion. Complains of right lower back pain which has been chronic, worsened by movement Psych:  No change in mood or affect.  No depression or anxiety. Neurologic: No headache, no dysesthesia, no focal weakness, no vision loss. No syncope  Physical Exam: Filed Vitals:   03/05/12 0112 03/05/12 0212 03/05/12 0520 03/05/12 0641  BP: 177/103  151/100   Pulse: 122  111   Temp: 97.8 F (36.6 C)  98 F (36.7 C)   TempSrc: Oral  Oral   Resp: 24  20   Height: 5\' 7"  (1.702 m)     SpO2: 100% 94% 99% 98%   General:  A&O x 3, NAD, nontoxic, pleasant/cooperative Head/Eye: No conjunctival hemorrhage, no icterus, [place]/AT, No nystagmus ENT:  No icterus,  No thrush, good dentition, no pharyngeal exudate Neck:  No masses, no lymphadenpathy, no bruits CV:  RRR, no rub, no gallop, no S3 Lung:  Bilateral expiratory wheeze. Diminished breath sounds at the bases. Good air movement. Scattered rales. Abdomen: soft/NT, +BS, nondistended, no peritoneal signs Ext: No cyanosis, No rashes, No petechiae, No lymphangitis, No edema Rectal--no masses, no ext hemorroids.  Fullness inferior portion of rectal vault 5-7 O'clock;  FOBT neg.  Labs on Admission:  Basic Metabolic Panel:  Recent Labs Lab 02/28/12 1453 03/05/12 0236  NA 135 143  K 4.8 3.7  CL 99 109  CO2 27  --   GLUCOSE 117* 91  BUN 12 18  CREATININE 0.89 1.10  CALCIUM 8.5  --    Liver Function Tests: No results found for this basename: AST, ALT, ALKPHOS, BILITOT, PROT, ALBUMIN,  in the last 168 hours No results found for this basename: LIPASE, AMYLASE,  in the last 168 hours No results found for this basename: AMMONIA,  in the last 168 hours CBC:  Recent Labs Lab 02/28/12 1453 03/05/12 0225 03/05/12 0236  WBC 12.8* 10.5  --   HGB 11.0* 9.7* 9.9*  HCT 34.9* 30.1* 29.0*  MCV 81.4 80.3  --   PLT 243 283  --    Cardiac Enzymes: No results found for this basename: CKTOTAL, CKMB, CKMBINDEX, TROPONINI,  in the last 168 hours BNP: No components found with this basename: POCBNP,  CBG: No results found for this basename: GLUCAP,  in the last 168  hours  Radiological Exams on Admission: Dg Chest 2 View  03/05/2012  *RADIOLOGY REPORT*  Clinical Data: Shortness of breath.  History of sarcoidosis and bronchitis.  Nonsmoker.  CHEST - 2 VIEW  Comparison: 02/28/2012  Findings: Normal heart size and pulmonary vascularity.  Chronic changes in the lungs with diffuse pulmonary fibrosis most prominently in the mid and lower lung zones bilaterally.  Bullous emphysematous changes in the upper lungs.  No definite change since previous study.  No evidence of developing consolidation or effusion.  No pneumothorax.  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 No definite evidence of any acute process.   Original Report Authenticated By: Burman Nieves, M.D.     EKG: Independently reviewed. Sinus tach, no ST-T    Time spent:70 minutes  Code Status:   FULL Family Communication:   Family at bedside   Javona Bergevin, DO  [facility] Pager [phone]  If 7PM-7AM, please contact night-coverage [email or web address] Password [facility] 03/05/2012, 8:06 AM

## 2012-03-05 NOTE — ED Provider Notes (Signed)
History     CSN: [id number]  Arrival date & time 03/05/12  0109   First MD Initiated Contact with Patient 03/05/12 0246      Chief Complaint  Patient presents with  . Shortness of Breath    (Consider location/radiation/quality/duration/timing/severity/associated sxs/prior treatment) Patient is a 47 y.o. male presenting with shortness of breath. The history is provided by the patient. No language interpreter was used.  Shortness of Breath Severity:  Moderate Onset quality:  Gradual Duration:  2 days Timing:  Intermittent Progression:  Worsening Relieved by:  Rest Worsened by:  Movement and activity Ineffective treatments:  Inhaler (nebulizer treatments) Associated symptoms: wheezing   Associated symptoms: no abdominal pain, no chest pain, no cough, no fever, no hemoptysis, no sore throat, no sputum production and no vomiting   Risk factors: no hx of PE/DVT   47yo male with pmh or sarcoidosis and SOB coming in with the same.  States that the past two days his sob has become worse.  He ended his prednisone 2 days ago as well.   States he has taken several albuterol nebs and used his inhaler as well prior to arrival.  Denies fever, n v.  Audible wheezing.  He was discharged from the hospital recently for the same.  Also c/o rectal pain since his discharge. He used hemmorroid meds with no relief.  Recent CT of the chest and CT of the abd/pelvis for his complaints that were unremarkable.   Past Medical History  Diagnosis Date  . Hypertension   . Sarcoidosis   . OSA on CPAP   . Chronic respiratory failure     hom oxygen PRN  . Bronchitis   . Pneumonia     Past Surgical History  Procedure Laterality Date  . Rotator cuff repair    . Arm surgery       Family History  Problem Relation Age of Onset  . Diabetes Father     History  Substance Use Topics  . Smoking status: Never Smoker   . Smokeless tobacco: Never Used  . Alcohol Use: Yes     Comment: social      Review  of Systems  Constitutional: Negative.  Negative for fever.  HENT: Negative.  Negative for sore throat.   Eyes: Negative.   Respiratory: Positive for shortness of breath and wheezing. Negative for cough, hemoptysis and sputum production.   Cardiovascular: Negative.  Negative for chest pain.  Gastrointestinal: Positive for rectal pain. Negative for nausea, vomiting, abdominal pain, diarrhea and blood in stool.  Neurological: Negative.   Psychiatric/Behavioral: Negative.   All other systems reviewed and are negative.    Allergies  Clindamycin/lincomycin  Home Medications   Current Outpatient Rx  Name  Route  Sig  Dispense  Refill  . albuterol (PROVENTIL HFA;VENTOLIN HFA) 108 (90 BASE) MCG/ACT inhaler   Inhalation   Inhale 2 puffs into the lungs every 6 (six) hours as needed. Wheezing         . amitriptyline (ELAVIL) 25 MG tablet   Oral   Take 25 mg by mouth at bedtime. For sleep         . beclomethasone (QVAR) 80 MCG/ACT inhaler   Inhalation   Inhale 2 puffs into the lungs 2 (two) times daily.         . calcium-vitamin D (OSCAL WITH D) 500-200 MG-UNIT per tablet   Oral   Take 1 tablet by mouth every morning.         Marland Kitchen  chlorthalidone (HYGROTON) 25 MG tablet   Oral   Take 25 mg by mouth every morning.          . cholecalciferol (VITAMIN D) 1000 UNITS tablet   Oral   Take 1,000 Units by mouth every morning.          Marland Kitchen Cod Liver Oil CAPS   Oral   Take 1 capsule by mouth every morning.          . cyclobenzaprine (FLEXERIL) 10 MG tablet   Oral   Take 10 mg by mouth 3 (three) times daily as needed. Muscle spasms.         . diphenhydrAMINE (BENADRYL) 25 MG tablet   Oral   Take 25 mg by mouth every 6 (six) hours as needed. Allergies         . docusate sodium (COLACE) 100 MG capsule   Oral   Take 1 capsule (100 mg total) by mouth every 12 (twelve) hours.   60 capsule   0   . guaiFENesin (MUCINEX) 600 MG 12 hr tablet   Oral   Take 1,200 mg by mouth  2 (two) times daily. For congestion.         . hydrocortisone (ANUSOL-HC) 25 MG suppository   Rectal   Place 1 suppository (25 mg total) rectally 2 (two) times daily.   12 suppository   0   . lidocaine (LIDODERM) 5 %   Transdermal   Place 1 patch onto the skin daily. Remove & Discard patch within 12 hours or as directed by MD         . metoprolol tartrate (LOPRESSOR) 12.5 mg TABS   Oral   Take 0.5 tablets (12.5 mg total) by mouth every 12 (twelve) hours.   60 tablet   0   . Multiple Vitamins-Minerals (MULTIVITAMIN WITH MINERALS) tablet   Oral   Take 1 tablet by mouth every morning.          Marland Kitchen omeprazole (PRILOSEC) 20 MG capsule   Oral   Take 20 mg by mouth every morning.          . pregabalin (LYRICA) 200 MG capsule   Oral   Take 200 mg by mouth 2 (two) times daily.         . traZODone (DESYREL) 100 MG tablet   Oral   Take 100 mg by mouth at bedtime. sleep         . vitamin C (ASCORBIC ACID) 250 MG tablet   Oral   Take 250 mg by mouth every morning.            BP 151/100  Pulse 111  Temp(Src) 98 F (36.7 C) (Oral)  Resp 20  Ht 5\' 7"  (1.702 m)  SpO2 99%  Physical Exam  Nursing note and vitals reviewed. Constitutional: He is oriented to person, place, and time. He appears well-developed and well-nourished.  HENT:  Head: Normocephalic.  Eyes: Conjunctivae and EOM are normal. Pupils are equal, round, and reactive to light.  Neck: Normal range of motion. Neck supple.  Cardiovascular: Normal rate.   Pulmonary/Chest: Effort normal.  Abdominal: Soft.  Musculoskeletal: Normal range of motion.  Neurological: He is alert and oriented to person, place, and time.  Skin: Skin is warm and dry.  Psychiatric: He has a normal mood and affect.    ED Course  Procedures (including critical care time)  Labs Reviewed  CBC - Abnormal; Notable for the following:    RBC 3.75 (*)  Hemoglobin 9.7 (*)    HCT 30.1 (*)    MCH 25.9 (*)    All other components  within normal limits  POCT I-STAT, CHEM 8 - Abnormal; Notable for the following:    Hemoglobin 9.9 (*)    HCT 29.0 (*)    All other components within normal limits   Dg Chest 2 View  03/05/2012  *RADIOLOGY REPORT*  Clinical Data: Shortness of breath.  History of sarcoidosis and bronchitis.  Nonsmoker.  CHEST - 2 VIEW  Comparison: 02/28/2012  Findings: Normal heart size and pulmonary vascularity.  Chronic changes in the lungs with diffuse pulmonary fibrosis most prominently in the mid and lower lung zones bilaterally.  Bullous emphysematous changes in the upper lungs.  No definite change since previous study.  No evidence of developing consolidation or effusion.  No pneumothorax.  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 No definite evidence of any acute process.   Original Report Authenticated By: Burman Nieves, M.D.      No diagnosis found.    MDM   47 yo male with sarcoidosis exacerbation and rectal pain.  Recent CT angio and CT abdoment and pelvis unremarkable for PE or rectal abscess. Chest x-ray shows no pneumonia. Patient remains tachycardic with audible wheezing despite albuterol nebs.    Date: 03/05/2012  Rate:113  Rhythm: sinus tachycardia  QRS Axis: normal  Intervals: normal  ST/T Wave abnormalities: normal  Conduction Disutrbances:none  Narrative Interpretation:   Old EKG Reviewed: unchanged Labs Reviewed  CBC - Abnormal; Notable for the following:    RBC 3.75 (*)    Hemoglobin 9.7 (*)    HCT 30.1 (*)    MCH 25.9 (*)    All other components within normal limits  POCT I-STAT, CHEM 8 - Abnormal; Notable for the following:    Hemoglobin 9.9 (*)    HCT 29.0 (*)    All other components within normal limits               Remi Haggard, NP 03/05/12 0700

## 2012-03-06 DIAGNOSIS — I10 Essential (primary) hypertension: Secondary | ICD-10-CM

## 2012-03-06 DIAGNOSIS — J471 Bronchiectasis with (acute) exacerbation: Principal | ICD-10-CM

## 2012-03-06 DIAGNOSIS — D869 Sarcoidosis, unspecified: Secondary | ICD-10-CM

## 2012-03-06 DIAGNOSIS — J441 Chronic obstructive pulmonary disease with (acute) exacerbation: Secondary | ICD-10-CM

## 2012-03-06 LAB — BASIC METABOLIC PANEL
CO2: 25 mEq/L (ref 19–32)
GFR calc non Af Amer: 88 mL/min — ABNORMAL LOW (ref >90)
Glucose, Bld: 150 mg/dL — ABNORMAL HIGH (ref 70–99)
Potassium: 4.6 mEq/L (ref 3.5–5.1)
Sodium: 137 mEq/L (ref 135–145)

## 2012-03-06 LAB — CBC
Hemoglobin: 9.8 g/dL — ABNORMAL LOW (ref 13.0–17.0)
MCH: 25.3 pg — ABNORMAL LOW (ref 26.0–34.0)
RBC: 3.88 MIL/uL — ABNORMAL LOW (ref 4.22–5.81)
WBC: 12.9 10*3/uL — ABNORMAL HIGH (ref 4.0–10.5)

## 2012-03-06 MED ORDER — IPRATROPIUM BROMIDE 0.02 % IN SOLN
0.5000 mg | Freq: Four times a day (QID) | RESPIRATORY_TRACT | Status: DC
  Administered 2012-03-06 – 2012-03-08 (×8): 0.5 mg via RESPIRATORY_TRACT
  Filled 2012-03-06 (×9): qty 2.5

## 2012-03-06 MED ORDER — FLUTICASONE PROPIONATE 50 MCG/ACT NA SUSP
2.0000 | Freq: Every day | NASAL | Status: DC
  Administered 2012-03-06 – 2012-03-08 (×3): 2 via NASAL
  Filled 2012-03-06: qty 16

## 2012-03-06 MED ORDER — SODIUM CHLORIDE 0.9 % IV SOLN: INTRAVENOUS | Status: DC

## 2012-03-06 MED ORDER — SALINE SPRAY 0.65 % NA SOLN
2.0000 | Freq: Four times a day (QID) | NASAL | Status: DC
  Administered 2012-03-06 – 2012-03-08 (×6): 2 via NASAL
  Filled 2012-03-06: qty 44

## 2012-03-06 MED ORDER — FAMOTIDINE 20 MG PO TABS
20.0000 mg | ORAL_TABLET | Freq: Every day | ORAL | Status: DC
  Administered 2012-03-06 – 2012-03-07 (×2): 20 mg via ORAL
  Filled 2012-03-06 (×3): qty 1

## 2012-03-06 MED ORDER — HYDRALAZINE HCL 20 MG/ML IJ SOLN
10.0000 mg | INTRAMUSCULAR | Status: DC | PRN
  Administered 2012-03-07: 10 mg via INTRAVENOUS
  Filled 2012-03-06 (×2): qty 1

## 2012-03-06 MED ORDER — ALBUTEROL SULFATE (5 MG/ML) 0.5% IN NEBU: 2.5000 mg | INHALATION_SOLUTION | RESPIRATORY_TRACT | Status: DC | PRN

## 2012-03-06 MED ORDER — METHYLPREDNISOLONE SODIUM SUCC 125 MG IJ SOLR
80.0000 mg | Freq: Two times a day (BID) | INTRAMUSCULAR | Status: DC
  Administered 2012-03-07 – 2012-03-08 (×3): 80 mg via INTRAVENOUS
  Filled 2012-03-06 (×5): qty 1.28

## 2012-03-06 MED ORDER — ALBUTEROL SULFATE (5 MG/ML) 0.5% IN NEBU
2.5000 mg | INHALATION_SOLUTION | Freq: Four times a day (QID) | RESPIRATORY_TRACT | Status: DC
  Administered 2012-03-06 – 2012-03-08 (×8): 2.5 mg via RESPIRATORY_TRACT
  Filled 2012-03-06 (×9): qty 0.5

## 2012-03-06 MED ORDER — HYDROCORTISONE ACETATE 25 MG RE SUPP
25.0000 mg | Freq: Two times a day (BID) | RECTAL | Status: DC | PRN
  Administered 2012-03-07: 25 mg via RECTAL
  Filled 2012-03-06: qty 1

## 2012-03-06 MED ORDER — POTASSIUM CHLORIDE IN NACL 20-0.9 MEQ/L-% IV SOLN
INTRAVENOUS | Status: DC
  Administered 2012-03-06 – 2012-03-08 (×5): via INTRAVENOUS
  Filled 2012-03-06 (×5): qty 1000

## 2012-03-06 MED ORDER — SALINE SPRAY 0.65 % NA SOLN
1.0000 | NASAL | Status: DC | PRN
  Administered 2012-03-06: 1 via NASAL
  Filled 2012-03-06: qty 44

## 2012-03-06 NOTE — Consult Note (Signed)
PULMONARY  / CRITICAL CARE MEDICINE  Name: [name] MRN: [id number] DOB: 1965-05-28    ADMISSION DATE:  03/05/2012 CONSULTATION DATE:  2/23  REFERRING MD :  Suanne Marker  CHIEF COMPLAINT:  Dyspnea   BRIEF PATIENT DESCRIPTION:  47 year old male f/b byrum for sarcoidosis, BTX and OSA. Non-compliant w/ f/u.   d/c from [facility] on 2/6 for what was felt to be exacerbation of sarcoid vs btx flare. He was sent home on pred taper. Returned to ER on 2/17 w/ report of worsening SOB. He was again treated w/ pred taper over 5d. Completed pred on 2/22. Presented to ED on 2/23 w/ 1 d h/o increased cough w/ streaky hemoptysis, chills and increased SOB. He was admitted to the medical ward. Treated w/ empiric zosyn and systemic steroids. PCCM asked to see for recurrent dyspnea pm 2/24  SIGNIFICANT EVENTS / STUDIES:  2/23: UDS: positive cocaine   CULTURES: Sputum 2/24>>>  ANTIBIOTICS: Zosyn 2/24>>>  HISTORY OF PRESENT ILLNESS:   See above   PAST MEDICAL HISTORY :  Past Medical History  Diagnosis Date  . Hypertension   . Sarcoidosis   . OSA on CPAP   . Chronic respiratory failure     hom oxygen PRN  . Bronchitis   . Pneumonia    Past Surgical History  Procedure Laterality Date  . Rotator cuff repair    . Arm surgery      Prior to Admission medications   Medication Sig Start Date End Date Taking? Authorizing Provider  albuterol (PROVENTIL HFA;VENTOLIN HFA) 108 (90 BASE) MCG/ACT inhaler Inhale 2 puffs into the lungs every 6 (six) hours as needed. Wheezing 04/13/11 04/12/12 Yes Estela Isaiah Blakes, MD  amitriptyline (ELAVIL) 25 MG tablet Take 25 mg by mouth at bedtime. For sleep   Yes Historical Provider, MD  beclomethasone (QVAR) 80 MCG/ACT inhaler Inhale 2 puffs into the lungs 2 (two) times daily. 01/06/12  Yes Leslye Peer, MD  calcium-vitamin D (OSCAL WITH D) 500-200 MG-UNIT per tablet Take 1 tablet by mouth every morning.   Yes Historical Provider, MD  chlorthalidone (HYGROTON) 25 MG tablet  Take 25 mg by mouth every morning.    Yes Historical Provider, MD  cholecalciferol (VITAMIN D) 1000 UNITS tablet Take 1,000 Units by mouth every morning.    Yes Historical Provider, MD  [facility] Liver Oil CAPS Take 1 capsule by mouth every morning.    Yes Historical Provider, MD  cyclobenzaprine (FLEXERIL) 10 MG tablet Take 10 mg by mouth 3 (three) times daily as needed. Muscle spasms.   Yes Historical Provider, MD  diphenhydrAMINE (BENADRYL) 25 MG tablet Take 25 mg by mouth every 6 (six) hours as needed. Allergies   Yes Historical Provider, MD  docusate sodium (COLACE) 100 MG capsule Take 1 capsule (100 mg total) by mouth every 12 (twelve) hours. 02/28/12  Yes Roxy Horseman, PA-C  guaiFENesin (MUCINEX) 600 MG 12 hr tablet Take 1,200 mg by mouth 2 (two) times daily. For congestion.   Yes Historical Provider, MD  hydrocortisone (ANUSOL-HC) 25 MG suppository Place 1 suppository (25 mg total) rectally 2 (two) times daily. 02/28/12  Yes Roxy Horseman, PA-C  lidocaine (LIDODERM) 5 % Place 1 patch onto the skin daily. Remove & Discard patch within 12 hours or as directed by MD   Yes Historical Provider, MD  metoprolol tartrate (LOPRESSOR) 12.5 mg TABS Take 0.5 tablets (12.5 mg total) by mouth every 12 (twelve) hours. 02/18/12  Yes Catarina Hartshorn, MD  Multiple Vitamins-Minerals (  MULTIVITAMIN WITH MINERALS) tablet Take 1 tablet by mouth every morning.    Yes Historical Provider, MD  omeprazole (PRILOSEC) 20 MG capsule Take 20 mg by mouth every morning.    Yes Historical Provider, MD  pregabalin (LYRICA) 200 MG capsule Take 200 mg by mouth 2 (two) times daily.   Yes Historical Provider, MD  traZODone (DESYREL) 100 MG tablet Take 100 mg by mouth at bedtime. sleep   Yes Historical Provider, MD  vitamin C (ASCORBIC ACID) 250 MG tablet Take 250 mg by mouth every morning.    Yes Historical Provider, MD   Allergies  Allergen Reactions  . Clindamycin/Lincomycin Swelling    FAMILY HISTORY:  Family History  Problem  Relation Age of Onset  . Diabetes Father    SOCIAL HISTORY:  reports that he has never smoked. He has never used smokeless tobacco. He reports that  drinks alcohol. He reports that he does not use illicit drugs.  REVIEW OF SYSTEMS bolds positive :   Constitutional: Negative for fever, chills, weight loss, malaise/fatigue and diaphoresis.  HENT: Negative for hearing loss, ear pain, nosebleeds, congestion, sore throat, neck pain, tinnitus and ear discharge.  Eyes: Negative for blurred vision, double vision, photophobia, pain, discharge and redness.  Respiratory: Negative for cough, hemoptysis, sputum production, shortness of breath, wheezing and stridor.   Cardiovascular: Negative for chest pain, palpitations, orthopnea, claudication, leg swelling and PND.  Gastrointestinal: Negative for heartburn, nausea, vomiting, abdominal pain, diarrhea, constipation, blood in stool and melena.  Genitourinary: Negative for dysuria, urgency, frequency, hematuria and flank pain.  Musculoskeletal: Negative for myalgias, back pain, joint pain and falls.  Skin: Negative for itching and rash.  Neurological: Negative for dizziness, tingling, tremors, sensory change, speech change, focal weakness, seizures, loss of consciousness, weakness and headaches.  Endo/Heme/Allergies: Negative for environmental allergies and polydipsia. Does not bruise/bleed easily.  SUBJECTIVE:  Still very SOB w/ minimal exertion   VITAL SIGNS: Temp:  [97.6 F (36.4 C)-97.8 F (36.6 C)] 97.6 F (36.4 C) (02/24 1458) Pulse Rate:  [58-110] 94 (02/24 1458) Resp:  [16-20] 20 (02/24 1458) BP: (145-157)/(99-102) 157/100 mmHg (02/24 1458) SpO2:  [91 %-100 %] 100 % (02/24 1458) 02 rx = 2lpm  PHYSICAL EXAMINATION: General:  Awake, oriented, no acute distress.  Neuro:  No focal def  HEENT:  + nasal congestion, no JVD Phonation hoarse  Cardiovascular:  rrr  Lungs:  Crackles bilateral bases  Abdomen:  Soft, non-tender  Musculoskeletal:   Intact  Skin:  Intact    Recent Labs Lab 03/05/12 0236 03/06/12 0415  NA 143 137  K 3.7 4.6  CL 109 102  CO2  --  25  BUN 18 16  CREATININE 1.10 1.00  GLUCOSE 91 150*   BNP    Component Value Date/Time   PROBNP 179.0* 03/05/2012 1015     Recent Labs Lab 03/05/12 0225 03/05/12 0236 03/06/12 0415  HGB 9.7* 9.9* 9.8*  HCT 30.1* 29.0* 31.8*  WBC 10.5  --  12.9*  PLT 283  --  328   Dg Chest 2 View  03/05/2012  *RADIOLOGY REPORT*  Clinical Data: Shortness of breath.  History of sarcoidosis and bronchitis.  Nonsmoker.  CHEST - 2 VIEW  Comparison: 02/28/2012  Findings: Normal heart size and pulmonary vascularity.  Chronic changes in the lungs with diffuse pulmonary fibrosis most prominently in the mid and lower lung zones bilaterally.  Bullous emphysematous changes in the upper lungs.  No definite change since previous study.  No evidence of  developing consolidation or effusion.  No pneumothorax.  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 No definite evidence of any acute process.   Original Report Authenticated By: Burman Nieves, M.D.       ASSESSMENT / PLAN: Multi-factoral Dyspnea w/ transient hemoptysis. Diff dx: sarcoid vs BTX flare, vs bullitis. Has marked bullous changes on right. His UDS was positive for cocaine so this could be playing a role in hemoptysis as well as possible sinus contribution to his symptoms. He flat out denies cocaine  use.   Recommendation -cont systemic steroids-->slow taper -would continue minimum 10-14d course of abx given his bullous disease, when he goes home would use levaquin to cover GNR.  -have discussed with him the risk of further cocaine  use -will discuss w/ team to decide on further/repeat CT imaging -add nasal saline and consider CT sinus imaging if we also re-image the chest -bnp unremarkable, will hold off from echo -check PCT baseline  NB  DDX of  difficult airways managment all start with A  and  include Adherence, Ace Inhibitors, Acid Reflux, Active Sinus Disease, Alpha 1 Antitripsin deficiency, Anxiety masquerading as Airways dz,  ABPA,  allergy(esp in young), Aspiration (esp in elderly), Adverse effects of DPI,  Active smokers, plus two Bs  = Bronchiectasis and Beta blocker use..and one C= CHF   .Adherence is always the initial "prime suspect" and is a multilayered concern that requires a "trust but verify" approach in every patient - starting with knowing how to use medications, especially inhalers, correctly, keeping up with refills and understanding the fundamental difference between maintenance and prns vs those medications only taken for a very short course and then stopped and not refilled.   ? Acid reflux > need empirical rx and avoid all oils in diet  Bronchiectasis > nothing else to offer here.   For now agree with abx and prednisone with caution as the med is tapered that he see [place] in office before tapering below 20 mg per day as this is where he says his meds quit working   Sandrea Hughs, MD Pulmonary and Critical Care Medicine [facility] Cell [phone] After 5:30 PM or weekends, call [phone]

## 2012-03-06 NOTE — ED Provider Notes (Signed)
Medical screening examination/treatment/procedure(s) were performed by non-physician practitioner and as supervising physician I was immediately available for consultation/collaboration.  Sunnie Nielsen, MD 03/06/12 [phone]

## 2012-03-06 NOTE — Progress Notes (Signed)
[facility] PROGRESS NOTE  [name] AVW:[id number] DOB: Apr 01, 1965 DOA: 03/05/2012 PCP: Provider Not In System  Assessment/Plan: Acute exacerbation bronchiectasis/Emphesyma/COPD  -?tracheobronchitis  -hemoptysis likely due to bronchiectasis--similar to previous admission  -continue Solu-Medrol, nebs  -continue empiric Zosyn given for now and follow cultures  -CT angio of the chest negative for PE but shows uncharged changes of sarcoidosis and bronchioectasis on 02/14/2012  -Patient has been noncompliant with outpatient followup with Dr. Elwin Sleight is very evasive when asked if he made any attempts to schedule an appointment  As noted per admitting MD  -today requesting Dr Delton Coombes to see him in hospital, will consult  -will add saline nasa spray a s requested Hyperglycemia  -Likely due to intravenous steroids, follow  -HbA1C--6.2  Sinus tachycardia  -Patient states that he has a history of sinus tachycardia previously evaluated by cardiology at [facility]  -Patient stated his resting heart rate between 94-104  -Influenza PCR negative on the last admission  -echocardiogram showed EF of 65-70%, grade 1 diastolic dysfunction, no wall motion abnormalities  -Added low-dose metoprolol tartrate for HTN and tachycardia--he responded well without any worsening of his respiratory status  -TSH- 0.342--> check free T4-->0.62 but pt on steroids - needs to follow up with PCP to have recheck TFTs in 4-6wks Rectal pain  -per rectal exam on admission--->FOBT NEG ,rectal fullness without mass on the inferior portion of rectum  -follow up outpt for possible sigmoidoscopy  Chest pain  -appears atypical  -EKGs negative, CEs neg.   Pulmonary sarcoidosis  On home O2-2L as needed. Not on steroids at baseline  OSA  On CPAP at home which can be continued  Hypertension  -Continue chlorthalidone--patient was noncompliant with therapy, did not take it daily  -Continue metoprolol tartrate      Code  Status: full Family Communication: family at bedsude Disposition Plan: to home when medically stable   Consultants:  pulm- eval pending  Procedures:  none  Antibiotics:  Zosyn started on 2/23  HPI/Subjective: C/o increased SOB this am  Objective: Filed Vitals:   03/06/12 0423 03/06/12 0510 03/06/12 0856 03/06/12 0915  BP:  145/102 154/99   Pulse:  86 100   Temp:  97.6 F (36.4 C)    TempSrc:  Oral    Resp:  20    Height:      Weight:      SpO2: 98% 99%  95%    Intake/Output Summary (Last 24 hours) at 03/06/12 1140 Last data filed at 03/06/12 0900  Gross per 24 hour  Intake 5576.25 ml  Output   3125 ml  Net 2451.25 ml   Filed Weights   03/05/12 1231  Weight: 78.79 kg (173 lb 11.2 oz)    Exam:   General:  Middle aged male in NAD, sleepy but easily aroused  Cardiovascular: RRR, nl S1S2  Respiratory: Scattered wheezes bil, no crackles  Abdomen: soft +BS NT/ND  Data Reviewed: Basic Metabolic Panel:  Recent Labs Lab 02/28/12 1453 03/05/12 0236 03/06/12 0415  NA 135 143 137  K 4.8 3.7 4.6  CL 99 109 102  CO2 27  --  25  GLUCOSE 117* 91 150*  BUN 12 18 16   CREATININE 0.89 1.10 1.00  CALCIUM 8.5  --  9.4   Liver Function Tests: No results found for this basename: AST, ALT, ALKPHOS, BILITOT, PROT, ALBUMIN,  in the last 168 hours No results found for this basename: LIPASE, AMYLASE,  in the last 168 hours No results found for this  basename: AMMONIA,  in the last 168 hours CBC:  Recent Labs Lab 02/28/12 1453 03/05/12 0225 03/05/12 0236 03/06/12 0415  WBC 12.8* 10.5  --  12.9*  HGB 11.0* 9.7* 9.9* 9.8*  HCT 34.9* 30.1* 29.0* 31.8*  MCV 81.4 80.3  --  82.0  PLT 243 283  --  328   Cardiac Enzymes:  Recent Labs Lab 03/05/12 1015 03/05/12 1525 03/05/12 2119  TROPONINI <0.30 <0.30 <0.30   BNP (last 3 results)  Recent Labs  04/11/11 2220 12/17/11 0110 03/05/12 1015  PROBNP 62.0 15.9 179.0*   CBG: No results found for this  basename: GLUCAP,  in the last 168 hours  Recent Results (from the past 240 hour(s))  CULTURE, BLOOD (ROUTINE X 2)     Status: None   Collection Time    03/05/12 10:15 AM      Result Value Range Status   Specimen Description BLOOD RIGHT ARM   Final   Special Requests BOTTLES DRAWN AEROBIC AND ANAEROBIC 5 CC EA   Final   Culture  Setup Time 03/05/2012 21:36   Final   Culture     Final   Value:        BLOOD CULTURE RECEIVED NO GROWTH TO DATE CULTURE WILL BE HELD FOR 5 DAYS BEFORE ISSUING A FINAL NEGATIVE REPORT   Report Status PENDING   Incomplete  CULTURE, BLOOD (ROUTINE X 2)     Status: None   Collection Time    03/05/12 10:25 AM      Result Value Range Status   Specimen Description BLOOD RIGHT HAND   Final   Special Requests BOTTLES DRAWN AEROBIC AND ANAEROBIC 5 CC EA   Final   Culture  Setup Time 03/05/2012 21:36   Final   Culture     Final   Value:        BLOOD CULTURE RECEIVED NO GROWTH TO DATE CULTURE WILL BE HELD FOR 5 DAYS BEFORE ISSUING A FINAL NEGATIVE REPORT   Report Status PENDING   Incomplete     Studies: Dg Chest 2 View  03/05/2012  *RADIOLOGY REPORT*  Clinical Data: Shortness of breath.  History of sarcoidosis and bronchitis.  Nonsmoker.  CHEST - 2 VIEW  Comparison: 02/28/2012  Findings: Normal heart size and pulmonary vascularity.  Chronic changes in the lungs with diffuse pulmonary fibrosis most prominently in the mid and lower lung zones bilaterally.  Bullous emphysematous changes in the upper lungs.  No definite change since previous study.  No evidence of developing consolidation or effusion.  No pneumothorax.  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 No definite evidence of any acute process.   Original Report Authenticated By: Burman Nieves, M.D.     Scheduled Meds: . albuterol  2.5 mg Nebulization Q6H  . calcium-vitamin D  1 tablet Oral Q breakfast  . cholecalciferol  1,000 Units Oral q morning - 10a  . docusate sodium   100 mg Oral Q12H  . enoxaparin (LOVENOX) injection  40 mg Subcutaneous Q24H  . hydrocortisone   Rectal BID  . ipratropium  0.5 mg Nebulization Q6H  . lidocaine  1 patch Transdermal Q24H  . methylPREDNISolone (SOLU-MEDROL) injection  60 mg Intravenous Q6H  . metoprolol tartrate  12.5 mg Oral Q12H  . multivitamin with minerals  1 tablet Oral Daily  . omega-3 acid ethyl esters  1 g Oral Daily  . pantoprazole  40 mg Oral Daily  . piperacillin-tazobactam  3.375 g Intravenous Q8H  . pregabalin  200 mg Oral BID  . sodium chloride  3 mL Intravenous Q12H  . vitamin C  250 mg Oral q morning - 10a   Continuous Infusions: . 0.9 % NaCl with KCl 20 mEq / L 75 mL/hr at 03/06/12 0200    Active Problems:   Sarcoid   Tracheobronchitis   Hyperglycemia, drug-induced   Sinus tachycardia   Bronchiectasis with acute exacerbation   HTN (hypertension)    Time spent: >30MINS    [facility]  [facility] Pager [phone]. If 8PM-8AM, please contact night-coverage at [email or web address], password [facility] 03/06/2012, 11:40 AM  LOS: 1 day

## 2012-03-06 NOTE — Progress Notes (Signed)
RT set patient up on CPAP with a full face mask per home mask type. Settting of 10cm H2O with 2L of oxygen bled in. Sterile water added to fill line. Patient states he is comfortable and is aware tocall if he needs any assistance.

## 2012-03-07 DIAGNOSIS — R0989 Other specified symptoms and signs involving the circulatory and respiratory systems: Secondary | ICD-10-CM

## 2012-03-07 MED ORDER — PIPERACILLIN-TAZOBACTAM 3.375 G IVPB
3.3750 g | Freq: Three times a day (TID) | INTRAVENOUS | Status: DC
  Administered 2012-03-07 – 2012-03-08 (×2): 3.375 g via INTRAVENOUS
  Filled 2012-03-07 (×4): qty 50

## 2012-03-07 MED ORDER — HYDROCORTISONE ACETATE 25 MG RE SUPP
25.0000 mg | Freq: Two times a day (BID) | RECTAL | Status: DC
  Administered 2012-03-08: 25 mg via RECTAL
  Filled 2012-03-07 (×3): qty 1

## 2012-03-07 NOTE — Progress Notes (Addendum)
PULMONARY  / CRITICAL CARE MEDICINE  Name: [name] MRN: [id number] DOB: [birth year]    ADMISSION DATE:  03/05/2012 CONSULTATION DATE:  2/23  REFERRING MD :  Suanne Marker  CHIEF COMPLAINT:  Dyspnea   BRIEF PATIENT DESCRIPTION:  47 year old male f/b byrum for sarcoidosis, BTX and OSA. Non-compliant w/ f/u.   d/c from [facility] on 2/6 for what was felt to be exacerbation of sarcoid vs btx flare. He was sent home on pred taper. Returned to ER on 2/17 w/ report of worsening SOB. He was again treated w/ pred taper over 5d. Completed pred on 2/22. Presented to ED on 2/23 w/ 1 d h/o increased cough w/ streaky hemoptysis, chills and increased SOB. He was admitted to the medical ward. Treated w/ empiric zosyn and systemic steroids. PCCM asked to see for recurrent dyspnea pm 2/24  SIGNIFICANT EVENTS / STUDIES:  2/23: UDS: positive cocaine   CULTURES: Sputum 2/23> not sent  ANTIBIOTICS: Zosyn 2/23>>>  SUBJECTIVE:  Still very SOB w/ minimal exertion   VITAL SIGNS: Temp:  [97.3 F (36.3 C)-98.1 F (36.7 C)] 98.1 F (36.7 C) (02/25 0448) Pulse Rate:  [92-102] 94 (02/25 0448) Resp:  [20] 20 (02/25 0448) BP: (130-157)/(86-109) 133/91 mmHg (02/25 0448) SpO2:  [94 %-100 %] 100 % (02/25 0824) 02 rx = 2lpm  PHYSICAL EXAMINATION: General:  Awake, oriented, no acute distress.  Neuro:  No focal def  HEENT:  + nasal congestion, no JVD Phonation hoarse  Cardiovascular:  rrr  Lungs:  Crackles bilateral bases  Abdomen:  Soft, non-tender  Musculoskeletal:  Intact  Skin:  Intact    Recent Labs Lab 03/05/12 0236 03/06/12 0415  NA 143 137  K 3.7 4.6  CL 109 102  CO2  --  25  BUN 18 16  CREATININE 1.10 1.00  GLUCOSE 91 150*   BNP    Component Value Date/Time   PROBNP 179.0* 03/05/2012 1015    Recent Labs Lab 03/05/12 0225 03/06/12 0415  WBC 10.5 12.9*        Recent Labs Lab 03/05/12 0225 03/05/12 0236 03/06/12 0415  HGB 9.7* 9.9* 9.8*  HCT 30.1* 29.0* 31.8*  WBC 10.5  --   12.9*  PLT 283  --  328   No results found.   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  No definite evidence of any acute process   ASSESSMENT / PLAN: Multi-factoral Dyspnea w/ transient hemoptysis. Diff dx: sarcoid vs BTX flare, vs bullitis. Has marked bullous changes on right. His UDS was positive for cocaine so this could be playing a role in hemoptysis as well as possible sinus contribution to his symptoms. He flat out denies cocaine  use.   Recommendation -cont systemic steroids-->slow taper; needs to be seen in our office before he reduces past 20mg /d (ideally prior to or closely after completing abx as well) -would continue minimum 10-14d course of abx (using 2/23 as the start date even though don't have specific organism) given his bullous disease, when he goes home would use levaquin to cover GNR.  -have discussed with him the risk of further cocaine  use -added nasal saline  -maximize GERD rx  We will s/o...have made him an appointment for march 7th at 2pm   Sandrea Hughs, MD Pulmonary and Critical Care Medicine [facility] Cell [phone] After 5:30 PM or weekends, call [phone]

## 2012-03-07 NOTE — Progress Notes (Addendum)
[facility] PROGRESS NOTE  [name] UXL:[id number] DOB: 1965/10/31 DOA: 03/05/2012 PCP: Provider Not In System  Assessment/Plan: Acute exacerbation bronchiectasis/Emphesyma/COPD  -?tracheobronchitis  -hemoptysis likely due to bronchiectasis--similar to previous admission  -continue Solu-Medrol, nebs  -continue empiric Zosyn given for now, pulmonary recommends a minimum of 10-14 day dose of antibiotics given his bullous disease and to change to Levaquin when his discharged for continued GNR coverage -CT angio of the chest negative for PE but shows uncharged changes of sarcoidosis and bronchioectasis on 02/14/2012  -Patient has been noncompliant with outpatient followup with Dr. Elwin Sleight is very evasive when asked if he made any attempts to schedule an appointment  As noted per admitting MD  -continue saline nasal spray Appreciate pulmonology input, and agree  With abx and prednisone with caution as the med is tapered that he see pulmonologist in office before tapering below 20 mg per day as this is where he says his meds quit working  Hyperglycemia  -Likely due to intravenous steroids, follow  -HbA1C--6.2  Sinus tachycardia  -Patient states that he has a history of sinus tachycardia previously evaluated by cardiology at [facility]  -Patient stated his resting heart rate between 94-104  -Influenza PCR negative on the last admission  -echocardiogram showed EF of 65-70%, grade 1 diastolic dysfunction, no wall motion abnormalities  -Added low-dose metoprolol tartrate for HTN and tachycardia--he responded well without any worsening of his respiratory status  -TSH- 0.342--> check free T4-->0.62 but pt on steroids - needs to follow up with PCP to have recheck TFTs in 4-6wks Rectal pain  -per rectal exam on admission--->FOBT NEG ,rectal fullness without mass on the inferior portion of rectum  -follow up outpt for possible sigmoidoscopy  Chest pain  -appears atypical  -EKGs negative, CEs  neg.   Pulmonary sarcoidosis  On home O2-2L as needed. Not on steroids at baseline  OSA  On CPAP at home which can be continued  Hypertension  -Continue chlorthalidone--patient was noncompliant with therapy, did not take it daily  -Continue metoprolol tartrate  + Cocaine positive UDS  -SW consult for substance abuse counselling   Code Status: full Family Communication: family at bedsude Disposition Plan: to home when medically stable   Consultants:  pulm- eval pending  Procedures:  none  Antibiotics:  Zosyn started on 2/23  HPI/Subjective: States begnng to breath better this am  Objective: Filed Vitals:   03/07/12 0238 03/07/12 0330 03/07/12 0448 03/07/12 0824  BP: 134/86 151/105 133/91   Pulse:  102 94   Temp:   98.1 F (36.7 C)   TempSrc:   Oral   Resp:  20 20   Height:      Weight:      SpO2:  94% 100% 100%    Intake/Output Summary (Last 24 hours) at 03/07/12 1208 Last data filed at 03/07/12 0851  Gross per 24 hour  Intake 2811.25 ml  Output   3000 ml  Net -188.75 ml   Filed Weights   03/05/12 1231  Weight: 78.79 kg (173 lb 11.2 oz)    Exam:   General:  Middle aged male in NAD, sleepy but easily aroused  Cardiovascular: RRR, nl S1S2  Respiratory: moderate movement, no wheezes bil, no crackles  Abdomen: soft +BS NT/ND  Data Reviewed: Basic Metabolic Panel:  Recent Labs Lab 03/05/12 0236 03/06/12 0415  NA 143 137  K 3.7 4.6  CL 109 102  CO2  --  25  GLUCOSE 91 150*  BUN 18 16  CREATININE  1.10 1.00  CALCIUM  --  9.4   Liver Function Tests: No results found for this basename: AST, ALT, ALKPHOS, BILITOT, PROT, ALBUMIN,  in the last 168 hours No results found for this basename: LIPASE, AMYLASE,  in the last 168 hours No results found for this basename: AMMONIA,  in the last 168 hours CBC:  Recent Labs Lab 03/05/12 0225 03/05/12 0236 03/06/12 0415  WBC 10.5  --  12.9*  HGB 9.7* 9.9* 9.8*  HCT 30.1* 29.0* 31.8*  MCV 80.3   --  82.0  PLT 283  --  328   Cardiac Enzymes:  Recent Labs Lab 03/05/12 1015 03/05/12 1525 03/05/12 2119  TROPONINI <0.30 <0.30 <0.30   BNP (last 3 results)  Recent Labs  04/11/11 2220 12/17/11 0110 03/05/12 1015  PROBNP 62.0 15.9 179.0*   CBG: No results found for this basename: GLUCAP,  in the last 168 hours  Recent Results (from the past 240 hour(s))  CULTURE, BLOOD (ROUTINE X 2)     Status: None   Collection Time    03/05/12 10:15 AM      Result Value Range Status   Specimen Description BLOOD RIGHT ARM   Final   Special Requests BOTTLES DRAWN AEROBIC AND ANAEROBIC 5 CC EA   Final   Culture  Setup Time 03/05/2012 21:36   Final   Culture     Final   Value:        BLOOD CULTURE RECEIVED NO GROWTH TO DATE CULTURE WILL BE HELD FOR 5 DAYS BEFORE ISSUING A FINAL NEGATIVE REPORT   Report Status PENDING   Incomplete  CULTURE, BLOOD (ROUTINE X 2)     Status: None   Collection Time    03/05/12 10:25 AM      Result Value Range Status   Specimen Description BLOOD RIGHT HAND   Final   Special Requests BOTTLES DRAWN AEROBIC AND ANAEROBIC 5 CC EA   Final   Culture  Setup Time 03/05/2012 21:36   Final   Culture     Final   Value:        BLOOD CULTURE RECEIVED NO GROWTH TO DATE CULTURE WILL BE HELD FOR 5 DAYS BEFORE ISSUING A FINAL NEGATIVE REPORT   Report Status PENDING   Incomplete     Studies: No results found.  Scheduled Meds: . albuterol  2.5 mg Nebulization Q6H  . calcium-vitamin D  1 tablet Oral Q breakfast  . cholecalciferol  1,000 Units Oral q morning - 10a  . docusate sodium  100 mg Oral Q12H  . enoxaparin (LOVENOX) injection  40 mg Subcutaneous Q24H  . famotidine  20 mg Oral QHS  . fluticasone  2 spray Each Nare Daily  . ipratropium  0.5 mg Nebulization Q6H  . lidocaine  1 patch Transdermal Q24H  . methylPREDNISolone (SOLU-MEDROL) injection  80 mg Intravenous Q12H  . metoprolol tartrate  12.5 mg Oral Q12H  . multivitamin with minerals  1 tablet Oral Daily   . pantoprazole  40 mg Oral Daily  . piperacillin-tazobactam  3.375 g Intravenous Q8H  . pregabalin  200 mg Oral BID  . sodium chloride  2 spray Each Nare QID  . sodium chloride  3 mL Intravenous Q12H  . vitamin C  250 mg Oral q morning - 10a   Continuous Infusions: . 0.9 % NaCl with KCl 20 mEq / L 75 mL/hr at 03/07/12 0241    Active Problems:   Sarcoid   Tracheobronchitis   Hyperglycemia,  drug-induced   Sinus tachycardia   Bronchiectasis with acute exacerbation   HTN (hypertension)    Time spent: >30MINS    [facility]  [facility] Pager [phone]. If 8PM-8AM, please contact night-coverage at [email or web address], password [facility] 03/07/2012, 12:08 PM  LOS: 2 days

## 2012-03-07 NOTE — Progress Notes (Signed)
Pt noticed blood in stool, called RN to look at it. Small amount of bright red blood noted on stool and toilet tissue, looked like bleeding from hemorrhoids. On-call MD notified since pt is on Lovenox. No new orders received at this time .  Earnest Conroy. Clelia Croft, RN

## 2012-03-08 DIAGNOSIS — J189 Pneumonia, unspecified organism: Secondary | ICD-10-CM

## 2012-03-08 DIAGNOSIS — I498 Other specified cardiac arrhythmias: Secondary | ICD-10-CM

## 2012-03-08 DIAGNOSIS — G4733 Obstructive sleep apnea (adult) (pediatric): Secondary | ICD-10-CM

## 2012-03-08 LAB — CBC
HCT: 32 % — ABNORMAL LOW (ref 39.0–52.0)
Hemoglobin: 10.3 g/dL — ABNORMAL LOW (ref 13.0–17.0)
MCV: 81.4 fL (ref 78.0–100.0)
RBC: 3.93 MIL/uL — ABNORMAL LOW (ref 4.22–5.81)
RDW: 14.8 % (ref 11.5–15.5)
WBC: 9.1 10*3/uL (ref 4.0–10.5)

## 2012-03-08 MED ORDER — LEVOFLOXACIN 750 MG PO TABS: 750.0000 mg | ORAL_TABLET | Freq: Every day | ORAL | Status: DC

## 2012-03-08 MED ORDER — PREDNISONE 50 MG PO TABS
60.0000 mg | ORAL_TABLET | Freq: Every day | ORAL | Status: DC
  Filled 2012-03-08: qty 1

## 2012-03-08 MED ORDER — FAMOTIDINE 20 MG PO TABS: 20.0000 mg | ORAL_TABLET | Freq: Two times a day (BID) | ORAL | Status: DC

## 2012-03-08 MED ORDER — FLUTICASONE PROPIONATE 50 MCG/ACT NA SUSP: 2.0000 | Freq: Every day | NASAL | Status: DC

## 2012-03-08 MED ORDER — PREDNISONE 20 MG PO TABS: ORAL_TABLET | ORAL | Status: DC

## 2012-03-08 MED ORDER — SALINE SPRAY 0.65 % NA SOLN: 2.0000 | Freq: Four times a day (QID) | NASAL | Status: DC

## 2012-03-08 NOTE — Discharge Summary (Signed)
Physician Discharge Summary  [name] UXL:[id number] DOB: 08/27/1965 DOA: 03/05/2012  PCP: Provider Not In System  Admit date: 03/05/2012 Discharge date: 03/08/2012  Recommendations for Outpatient Follow-up:  1. Slow steroid taper to not reduce more than 20mg  per day prior to follow up with pulmonology 2. Continue 14 day course of antibiotics, last day on 3/8.   3. Repeat thyroid function tests in 4-6 weeks.   4. Follow up outpt for possible sigmoidoscopy for soft tissue fullness and BRBPR.    Discharge Diagnoses:  Active Problems:   Sarcoid   Tracheobronchitis   Hyperglycemia, drug-induced   Sinus tachycardia   Bronchiectasis with acute exacerbation   HTN (hypertension)   Discharge Condition: stable, improved  Diet recommendation: healthy heart  Wt Readings from Last 3 Encounters:  03/05/12 78.79 kg (173 lb 11.2 oz)  02/18/12 74.6 kg (164 lb 7.4 oz)  01/06/12 70.398 kg (155 lb 3.2 oz)    History of present illness:   47 year old male with a history of sarcoidosis, bronchiectasis, hypertension, and obstructive sleep apnea presents with a two-day history of worsening shortness of breath. The patient was recently discharged from [facility] on 02/17/2012 for a similar exacerbation of his sarcoidosis and bronchiectasis. The patient was discharged home with a prednisone wean over 4 days. He came back to the emergency department 02/28/2012 with shortness of breath and rectal pain. The patient was discharged from the emergency department at that time with prednisone wean over 5 days. CT of the abdomen and pelvis on February 17 revealed soft tissue fullness at the anal verge. The patient was given [facility] which she states improved his rectal pain. He denies any melena or hematochezia. There is no abdominal pain. There is no weight loss, and his appetite has been excellent. The patient has never had an endoscopy.   He came back to the emergency department today, 03/05/2012 because of continued  shortness of breath. He finished his prednisone wean yesterday. He tried taking his home nebulizer and MDIs without improvement. He does complain of increasing cough with streaks of blood. He denies any actual fevers, but has some chills and sweats. He denies any nausea, vomiting, diarrhea, abdominal pain, hematochezia, melena, dysuria, hematuria. He does complain of some chest discomfort. In the emergency department, the patient was given albuterol neb and prednisone 60 mg. He states that he is minimally improved. Chest x-ray reveals chronic changes with bilateral lung opacities, emphysematous changes.   When I entered the room, the patient was sleeping soundly without any distress whatsoever. He woke up without difficulty and was alert and oriented. Oxygen saturation 98-99% on 2 L.   Hospital Course:   Acute exacerbation bronchiectasis/Emphesyma/COPD versus sarcoid.  [name] was admitted for acute hypoxic respiratory failure and was started on nasal canula, IV solumedrol, and duonebs.  Influenza PCR negative on the last admission.  He was also started on IV antibiotics for tracheobronchitis and COPD.  He underwent CT angio of the chest which was negative for PE but which demonstrated uncharged changes of sarcoidosis and bronchioectasis compared to 02/14/2012.  He also has marked bullous changes on the right.  He was seen by pulmonology who recommended continuing antibiotics for 10-14 days and starting a slow steroid taper with close outpatient follow up.  They maximized his acid reflux treatment and added nasal saline.  His breathing improved and he was able to wean to room air and maintain is oxygen saturations in the high 90s while ambulating.    Hyperglycemia with borderline/at risk  for diabetes.   HbA1C--6.2  Likely due to steroids and will need close outpatient follow up.  Anticipate that fingersticks should trend down as steroids are tapered.   Sinus tachycardia  Patient states that he has a  history of sinus tachycardia previously evaluated by cardiology at [facility] with resting heart rate between 94-104.  TSH- 0.342--> check free T4-->0.62 while on high dose steroids.  Will need repeat TFTs in 4-6 weeks by primary care doctor.  Echocardiogram showed EF of 65-70%, grade 1 diastolic dysfunction, no wall motion abnormalities.  He was started on low-dose metoprolol tartrate for HTN and tachycardia and he responded well without any worsening of his respiratory status.     Rectal pain and small amount of BRB blood on top of stools and on TP.  Likely related to hemorrhoids.  Rectal fullness without mass on the inferior portion of rectum.  Hemoglobin remained stable and he was placed on stool softeners and anusol.  Follow up outpt for possible sigmoidoscopy.    Chest pain, atypical.  EKGs negative, CEs neg.  Likely related to respiratory issues and resolved.    Pulmonary sarcoidosis.  On home O2-2L as needed. Not on steroids at baseline but will be started on a long taper as above.    OSA Continued CPAP.    Hypertension with elevated BPs.  Encouraged compliance with medical therapy.    + Cocaine positive UDS, but patient denies cocaine use.  SW consulted for substance abuse counselling  Consultants:  pulmology Procedures:  none Antibiotics:  Zosyn started on 2/23 >> 2/26, then levofloxacin to complete a 14 day course 3/8  Discharge Exam: Filed Vitals:   03/08/12 0515  BP: 155/94  Pulse: 76  Temp: 97 F (36.1 C)  Resp: 20   Filed Vitals:   03/07/12 2045 03/07/12 2130 03/08/12 0515 03/08/12 0927  BP:  150/106 155/94   Pulse:  99 76   Temp:  97.8 F (36.6 C) 97 F (36.1 C)   TempSrc:  Oral Oral   Resp:  20 20   Height:      Weight:      SpO2: 98% 100% 100% 96%    General: AAM, no acute distress, resting in bed with audible occasional wheeze HEENT:  NCAT, MMM Cardiovascular: Mild tachycardia, regular rhythm, no mrg, 2+ pulses, warm extremities Respiratory:  Mildly  diminished BS at the bases with rales that clear somewhat with repeat respirations.  Occasional wheezing that sounds primarily upper airway.  No rhonchi.  No increased WOB ABD:  NABS, soft, mildly distended, nontender MSK:  No LEE, FROM  Discharge Instructions      Discharge Orders   Future Appointments Provider Department Dept Phone   03/17/2012 2:00 PM Julio Sicks, NP [facility] [phone]   05/08/2012 2:00 PM [facility] [address] [facility] [address] [phone]   Patient to arrive 15 minutes prior to appointment time. No solid food 4 hours prior to exam. Liquids and Medicines are okay.   Future Orders Complete By Expires     (HEART FAILURE PATIENTS) Call MD:  Anytime you have any of the following symptoms: 1) 3 pound weight gain in 24 hours or 5 pounds in 1 week 2) shortness of breath, with or without a dry hacking cough 3) swelling in the hands, feet or stomach 4) if you have to sleep on extra pillows at night in order to breathe.  As directed     Call MD for:  difficulty breathing, headache  or visual disturbances  As directed     Call MD for:  extreme fatigue  As directed     Call MD for:  hives  As directed     Call MD for:  persistant dizziness or light-headedness  As directed     Call MD for:  persistant nausea and vomiting  As directed     Call MD for:  severe uncontrolled pain  As directed     Call MD for:  temperature >100.4  As directed     Diet - low sodium heart healthy  As directed     Discharge instructions  As directed     Comments:      You were hospitalized with difficulty breathing that may be related to sarcoid or COPD.  You were started on high dose steroids and should continue a long taper:  Take prednisone 6 tabs once daily for two days starting tomorrow, then 5 tabs daily for two days, then 4 tabs daily for 2 days, then 3 tabs daily for 2 days, then 2 tabs daily thereafter until you follow up with pulmonology.  Please continue your  inhalers as previously prescribed.  You were started on antibiotics and should continue a two week course of antibiotics with levofloxacin, next dose tomorrow morning and final dose on 03/18/12.  Please add famotidine and nasal sprays to continue to reduce your nasal congestion which may be making you feel Leona Pressly of breath.    Increase activity slowly  As directed         Medication List    TAKE these medications       albuterol 108 (90 BASE) MCG/ACT inhaler  Commonly known as:  PROVENTIL HFA;VENTOLIN HFA  Inhale 2 puffs into the lungs every 6 (six) hours as needed. Wheezing     amitriptyline 25 MG tablet  Commonly known as:  ELAVIL  Take 25 mg by mouth at bedtime. For sleep     beclomethasone 80 MCG/ACT inhaler  Commonly known as:  QVAR  Inhale 2 puffs into the lungs 2 (two) times daily.     calcium-vitamin D 500-200 MG-UNIT per tablet  Commonly known as:  OSCAL WITH D  Take 1 tablet by mouth every morning.     chlorthalidone 25 MG tablet  Commonly known as:  HYGROTON  Take 25 mg by mouth every morning.     cholecalciferol 1000 UNITS tablet  Commonly known as:  VITAMIN D  Take 1,000 Units by mouth every morning.     Cod Liver Oil Caps  Take 1 capsule by mouth every morning.     cyclobenzaprine 10 MG tablet  Commonly known as:  FLEXERIL  Take 10 mg by mouth 3 (three) times daily as needed. Muscle spasms.     diphenhydrAMINE 25 MG tablet  Commonly known as:  BENADRYL  Take 25 mg by mouth every 6 (six) hours as needed. Allergies     docusate sodium 100 MG capsule  Commonly known as:  COLACE  Take 1 capsule (100 mg total) by mouth every 12 (twelve) hours.     famotidine 20 MG tablet  Commonly known as:  PEPCID  Take 1 tablet (20 mg total) by mouth 2 (two) times daily.     fluticasone 50 MCG/ACT nasal spray  Commonly known as:  FLONASE  Place 2 sprays into the nose daily.     guaiFENesin 600 MG 12 hr tablet  Commonly known as:  MUCINEX  Take 1,200 mg by mouth  2  (two) times daily. For congestion.     hydrocortisone 25 MG suppository  Commonly known as:  ANUSOL-HC  Place 1 suppository (25 mg total) rectally 2 (two) times daily.     levofloxacin 750 MG tablet  Commonly known as:  LEVAQUIN  Take 1 tablet (750 mg total) by mouth daily.     lidocaine 5 %  Commonly known as:  LIDODERM  Place 1 patch onto the skin daily. Remove & Discard patch within 12 hours or as directed by MD     metoprolol tartrate 12.5 mg Tabs  Commonly known as:  LOPRESSOR  Take 0.5 tablets (12.5 mg total) by mouth every 12 (twelve) hours.     multivitamin with minerals tablet  Take 1 tablet by mouth every morning.     omeprazole 20 MG capsule  Commonly known as:  PRILOSEC  Take 20 mg by mouth every morning.     predniSONE 20 MG tablet  Commonly known as:  DELTASONE  Take 6 tabs daily x 2 days, 5 tabs x 2 days, 4 tabs x 2 days, 3 tabs x 2 days, 2 tabs daily until follow up with pulmonology.     pregabalin 200 MG capsule  Commonly known as:  LYRICA  Take 200 mg by mouth 2 (two) times daily.     sodium chloride 0.65 % Soln nasal spray  Commonly known as:  OCEAN  Place 2 sprays into the nose 4 (four) times daily.     traZODone 100 MG tablet  Commonly known as:  DESYREL  Take 100 mg by mouth at bedtime. sleep     vitamin C 250 MG tablet  Commonly known as:  ASCORBIC ACID  Take 250 mg by mouth every morning.       Follow-up Information   Follow up with PARRETT,TAMMY, NP On 03/17/2012. (at 2pm )    Contact information:   [address] [place] [address] [phone]       Follow up with Primary care physician. Schedule an appointment as soon as possible for a visit in 1 month. (follow up thyroid tests)        The results of significant diagnostics from this hospitalization (including imaging, microbiology, ancillary and laboratory) are listed below for reference.    Significant Diagnostic Studies: Dg Chest 2 View  03/05/2012  *RADIOLOGY REPORT*   Clinical Data: Shortness of breath.  History of sarcoidosis and bronchitis.  Nonsmoker.  CHEST - 2 VIEW  Comparison: 02/28/2012  Findings: Normal heart size and pulmonary vascularity.  Chronic changes in the lungs with diffuse pulmonary fibrosis most prominently in the mid and lower lung zones bilaterally.  Bullous emphysematous changes in the upper lungs.  No definite change since previous study.  No evidence of developing consolidation or effusion.  No pneumothorax.  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 No definite evidence of any acute process.   Original Report Authenticated By: Burman Nieves, M.D.    Dg Chest 2 View  02/28/2012  *RADIOLOGY REPORT*  Clinical Data: Sarcoidosis.  Shortness of breath.  CHEST - 2 VIEW  Comparison: CT 02/14/2012.  Radiographs 02/13/2012.  Findings: The heart size and mediastinal contours are stable. There is extensive chronic lung disease with chronic perihilar pulmonary opacities bilaterally.  Based on the lateral view, there may be slight contraction of this process, although this difference may be positional.  There is no new airspace disease, significant pleural effusion or pneumothorax.  IMPRESSION: Grossly stable extensive chronic  lung disease consistent with chronic sarcoidosis.  No acute process identified.   Original Report Authenticated By: Carey Bullocks, M.D.    Dg Chest 2 View  02/13/2012  *RADIOLOGY REPORT*  Clinical Data: History of pulmonary sarcoidosis, now with worsening shortness of breath  CHEST - 2 VIEW  Comparison: 12/16/2011; 01/15/2011; 10/21/2010; chest CT - 04/12/2011  Findings:  Grossly unchanged cardiac silhouette and mediastinal contours. Extensive bilateral mid lung heterogeneous air space opacities are grossly unchanged, left greater than right.  Grossly stable findings of bullous change involving the right upper lobe with associated asymmetric right apical pleuroparenchymal thickening. Given extensive  background parenchymal abnormalities, there are no new focal airspace opacities.  No pleural effusion or pneumothorax. Unchanged bones including resection of the distal end of the right clavicle.  IMPRESSION: Grossly unchanged findings of extensive pulmonary sarcoidosis with associated opacities and architectural distortion without definite superimposed acute cardiopulmonary disease.   Original Report Authenticated By: Tacey Ruiz, MD    Ct Angio Chest Pe W/cm &/or Wo Cm  02/14/2012  *RADIOLOGY REPORT*  Clinical Data: Progressive shortness of breath with tachycardia and pleurisy.  Sarcoid.  CT ANGIOGRAPHY CHEST  Technique:  Multidetector CT imaging of the chest using the standard protocol during bolus administration of intravenous contrast. Multiplanar reconstructed images including MIPs were obtained and reviewed to evaluate the vascular anatomy.  Contrast: 80mL OMNIPAQUE IOHEXOL 350 MG/ML SOLN  Comparison: 04/12/2011.  Findings: No pulmonary embolus. Calcified and noncalcified mediastinal and hilar lymph nodes are again seen measuring up to 2 cm Ariana Juul axis in the subcarinal station, as before.  No axillary adenopathy.  Bilateral gynecomastia.  Heart size normal.  No pericardial effusion.  There are several sub centimeter Jhayla Podgorski axis juxta diaphragmatic lymph nodes as well.  Confluent perihilar soft tissue masses are seen with traction bronchiectasis, architectural distortion and perilymphatic nodularity in the lungs bilaterally.  Distribution and severity are unchanged.  Interval clearing of previously seen ground-glass air space disease in the left upper lobe.  Cystic cavity or bullous lesion in the right upper lobe is unchanged.  No pleural fluid. Scattered adherent debris in the airway.  Incidental imaging of the upper abdomen shows no acute findings. No worrisome lytic or sclerotic lesions.  IMPRESSION:  1.  No pulmonary embolus. 2.  Mediastinal, hilar and pulmonary parenchymal changes of sarcoid, unchanged.    Original Report Authenticated By: Leanna Battles, M.D.    Ct Pelvis W Contrast  02/28/2012  *RADIOLOGY REPORT*  Clinical Data:  Rectal pain, shortness of breath  CT PELVIS WITH CONTRAST  Technique:  Multidetector CT imaging of the pelvis was performed using the standard protocol following the bolus administration of intravenous contrast.  Contrast: 50mL OMNIPAQUE IOHEXOL 300 MG/ML  SOLN, OMNIPAQUE IOHEXOL 300 MG/ML  SOLN  Comparison:   None.  Findings:  Normal-caliber large and small bowel throughout the visualized lower abdomen and anatomic pelvis.  A few scattered colonic diverticula are noted.  There is a moderate volume of stool in the visualized colon.  No intra-abdominal or pelvic ascites. The normal appendix can be identified in the right lower quadrant. The bladder is distended with urine.  Unremarkable prostate gland and seminal vesicles.  Nonspecific soft tissue fullness at the anal verge measures up to 3.4 cm in width.  No focal fluid collection to suggest abscess.  No significant inflammatory change in the ischiorectal fossae bilaterally.  No acute osseous abnormality.  No significant atherosclerotic vascular disease in the visualized vessels.  IMPRESSION:  1.  Nonspecific  soft tissue fullness at the anal verge may represent hemorrhoidal tissue, or potentially a soft tissue mass. This should be amenable to direct inspection.  No focal fluid collection to suggest abscess and no adjacent inflammatory stranding in the ischiorectal fossae.  2.  Moderate volume of formed stool throughout the colon may suggest an element of underlying constipation.  3.  No evidence of bowel obstruction in the visualized lower abdomen and anatomic pelvis.   Original Report Authenticated By: Malachy Moan, M.D.     Microbiology: Recent Results (from the past 240 hour(s))  CULTURE, BLOOD (ROUTINE X 2)     Status: None   Collection Time    03/05/12 10:15 AM      Result Value Range Status   Specimen  Description BLOOD RIGHT ARM   Final   Special Requests BOTTLES DRAWN AEROBIC AND ANAEROBIC 5 CC EA   Final   Culture  Setup Time 03/05/2012 21:36   Final   Culture     Final   Value:        BLOOD CULTURE RECEIVED NO GROWTH TO DATE CULTURE WILL BE HELD FOR 5 DAYS BEFORE ISSUING A FINAL NEGATIVE REPORT   Report Status PENDING   Incomplete  CULTURE, BLOOD (ROUTINE X 2)     Status: None   Collection Time    03/05/12 10:25 AM      Result Value Range Status   Specimen Description BLOOD RIGHT HAND   Final   Special Requests BOTTLES DRAWN AEROBIC AND ANAEROBIC 5 CC EA   Final   Culture  Setup Time 03/05/2012 21:36   Final   Culture     Final   Value:        BLOOD CULTURE RECEIVED NO GROWTH TO DATE CULTURE WILL BE HELD FOR 5 DAYS BEFORE ISSUING A FINAL NEGATIVE REPORT   Report Status PENDING   Incomplete     Labs: Basic Metabolic Panel:  Recent Labs Lab 03/05/12 0236 03/06/12 0415  NA 143 137  K 3.7 4.6  CL 109 102  CO2  --  25  GLUCOSE 91 150*  BUN 18 16  CREATININE 1.10 1.00  CALCIUM  --  9.4   Liver Function Tests: No results found for this basename: AST, ALT, ALKPHOS, BILITOT, PROT, ALBUMIN,  in the last 168 hours No results found for this basename: LIPASE, AMYLASE,  in the last 168 hours No results found for this basename: AMMONIA,  in the last 168 hours CBC:  Recent Labs Lab 03/05/12 0225 03/05/12 0236 03/06/12 0415 03/08/12 0811  WBC 10.5  --  12.9* 9.1  HGB 9.7* 9.9* 9.8* 10.3*  HCT 30.1* 29.0* 31.8* 32.0*  MCV 80.3  --  82.0 81.4  PLT 283  --  328 359   Cardiac Enzymes:  Recent Labs Lab 03/05/12 1015 03/05/12 1525 03/05/12 2119  TROPONINI <0.30 <0.30 <0.30   BNP: BNP (last 3 results)  Recent Labs  04/11/11 2220 12/17/11 0110 03/05/12 1015  PROBNP 62.0 15.9 179.0*   CBG: No results found for this basename: GLUCAP,  in the last 168 hours  Time coordinating discharge: 35 minutes  Signed:  Sincere Liuzzi  [facility] 03/08/2012,  11:12 AM

## 2012-03-08 NOTE — Progress Notes (Signed)
Per MD order and to agreement of patient, ring gold in color was cut off by [employer]. Pt happy that ring is off.

## 2012-03-11 LAB — CULTURE, BLOOD (ROUTINE X 2)
Culture: NO GROWTH
Culture: NO GROWTH

## 2012-03-12 ENCOUNTER — Emergency Department (HOSPITAL_COMMUNITY): Payer: 59

## 2012-03-12 ENCOUNTER — Emergency Department (HOSPITAL_COMMUNITY)
Admission: EM | Admit: 2012-03-12 | Discharge: 2012-03-12 | Disposition: A | Discharge status: Home / Self Care | Payer: 59 | Attending: Emergency Medicine | Admitting: Emergency Medicine

## 2012-03-12 ENCOUNTER — Encounter (HOSPITAL_COMMUNITY): Payer: Self-pay | Admitting: *Deleted

## 2012-03-12 DIAGNOSIS — Z8701 Personal history of pneumonia (recurrent): Secondary | ICD-10-CM | POA: Insufficient documentation

## 2012-03-12 DIAGNOSIS — IMO0002 Reserved for concepts with insufficient information to code with codable children: Secondary | ICD-10-CM | POA: Insufficient documentation

## 2012-03-12 DIAGNOSIS — M7989 Other specified soft tissue disorders: Secondary | ICD-10-CM

## 2012-03-12 DIAGNOSIS — I10 Essential (primary) hypertension: Secondary | ICD-10-CM | POA: Insufficient documentation

## 2012-03-12 DIAGNOSIS — T4995XA Adverse effect of unspecified topical agent, initial encounter: Secondary | ICD-10-CM | POA: Insufficient documentation

## 2012-03-12 DIAGNOSIS — Z8669 Personal history of other diseases of the nervous system and sense organs: Secondary | ICD-10-CM | POA: Insufficient documentation

## 2012-03-12 DIAGNOSIS — Z8619 Personal history of other infectious and parasitic diseases: Secondary | ICD-10-CM | POA: Insufficient documentation

## 2012-03-12 DIAGNOSIS — Z79899 Other long term (current) drug therapy: Secondary | ICD-10-CM | POA: Insufficient documentation

## 2012-03-12 DIAGNOSIS — J40 Bronchitis, not specified as acute or chronic: Secondary | ICD-10-CM | POA: Insufficient documentation

## 2012-03-12 DIAGNOSIS — M25473 Effusion, unspecified ankle: Secondary | ICD-10-CM | POA: Insufficient documentation

## 2012-03-12 DIAGNOSIS — R062 Wheezing: Secondary | ICD-10-CM

## 2012-03-12 DIAGNOSIS — M25476 Effusion, unspecified foot: Secondary | ICD-10-CM | POA: Insufficient documentation

## 2012-03-12 DIAGNOSIS — Z791 Long term (current) use of non-steroidal anti-inflammatories (NSAID): Secondary | ICD-10-CM | POA: Insufficient documentation

## 2012-03-12 LAB — CBC WITH DIFFERENTIAL/PLATELET
Basophils Absolute: 0 10*3/uL (ref 0.0–0.1)
Basophils Relative: 0 % (ref 0–1)
Lymphocytes Relative: 11 % — ABNORMAL LOW (ref 12–46)
MCHC: 31.2 g/dL (ref 30.0–36.0)
Neutro Abs: 4.9 10*3/uL (ref 1.7–7.7)
Platelets: 485 10*3/uL — ABNORMAL HIGH (ref 150–400)
RDW: 15.2 % (ref 11.5–15.5)
WBC: 6.9 10*3/uL (ref 4.0–10.5)

## 2012-03-12 LAB — COMPREHENSIVE METABOLIC PANEL
ALT: 21 U/L (ref 0–53)
AST: 30 U/L (ref 0–37)
Albumin: 3 g/dL — ABNORMAL LOW (ref 3.5–5.2)
CO2: 27 mEq/L (ref 19–32)
Calcium: 9 mg/dL (ref 8.4–10.5)
Chloride: 103 mEq/L (ref 96–112)
Creatinine, Ser: 0.95 mg/dL (ref 0.50–1.35)
GFR calc non Af Amer: >90 mL/min (ref >90)
Sodium: 141 mEq/L (ref 135–145)
Total Bilirubin: 0.2 mg/dL — ABNORMAL LOW (ref 0.3–1.2)

## 2012-03-12 MED ORDER — ALBUTEROL SULFATE (5 MG/ML) 0.5% IN NEBU
2.5000 mg | INHALATION_SOLUTION | Freq: Once | RESPIRATORY_TRACT | Status: AC
  Administered 2012-03-12: 2.5 mg via RESPIRATORY_TRACT
  Filled 2012-03-12: qty 1

## 2012-03-12 MED ORDER — IPRATROPIUM BROMIDE 0.02 % IN SOLN
0.5000 mg | Freq: Once | RESPIRATORY_TRACT | Status: AC
  Administered 2012-03-12: 0.5 mg via RESPIRATORY_TRACT
  Filled 2012-03-12: qty 2.5

## 2012-03-12 MED ORDER — SODIUM CHLORIDE 0.9 % IV BOLUS (SEPSIS): 500.0000 mL | Freq: Once | INTRAVENOUS | Status: DC

## 2012-03-12 NOTE — ED Provider Notes (Signed)
Medical screening examination/treatment/procedure(s) were performed by non-physician practitioner and as supervising physician I was immediately available for consultation/collaboration.   Richardean Canal, MD 03/12/12 [phone]

## 2012-03-12 NOTE — ED Notes (Signed)
Patient transported to X-ray 

## 2012-03-12 NOTE — ED Provider Notes (Signed)
History     CSN: [id number]  Arrival date & time 03/12/12  1211   First MD Initiated Contact with Patient 03/12/12 1241      Chief Complaint  Patient presents with  . Allergic Reaction  . Joint Swelling    (Consider location/radiation/quality/duration/timing/severity/associated sxs/prior treatment) HPI Comments: Pt states that he developed some swelling to his right ankle and leg:pt states that his left knee was swollen but it resolved:pt state that he has been sob since his admission last week and he continues to be on antibiotics for that and he was worried that he may be having an allergic reaction:pt states that he is oxygen at home:pt denies cp, sob, fever, cough,or redness to the extremities:pt states that he has been biting the sides of his tongue recently so it must be enlarged  No language interpreter was used.    Past Medical History  Diagnosis Date  . Hypertension   . Sarcoidosis   . OSA on CPAP   . Chronic respiratory failure     hom oxygen PRN  . Bronchitis   . Pneumonia     Past Surgical History  Procedure Laterality Date  . Rotator cuff repair    . Arm surgery       Family History  Problem Relation Age of Onset  . Diabetes Father     History  Substance Use Topics  . Smoking status: Never Smoker   . Smokeless tobacco: Never Used  . Alcohol Use: Yes     Comment: social      Review of Systems  Constitutional: Negative.   Respiratory: Negative.   Cardiovascular: Negative.     Allergies  Clindamycin/lincomycin  Home Medications   Current Outpatient Rx  Name  Route  Sig  Dispense  Refill  . albuterol (PROVENTIL HFA;VENTOLIN HFA) 108 (90 BASE) MCG/ACT inhaler   Inhalation   Inhale 2 puffs into the lungs every 6 (six) hours as needed. Wheezing         . amitriptyline (ELAVIL) 25 MG tablet   Oral   Take 25 mg by mouth at bedtime. For sleep         . beclomethasone (QVAR) 80 MCG/ACT inhaler   Inhalation   Inhale 2 puffs into the  lungs 2 (two) times daily.         . calcium-vitamin D (OSCAL WITH D) 500-200 MG-UNIT per tablet   Oral   Take 1 tablet by mouth every morning.         . chlorthalidone (HYGROTON) 25 MG tablet   Oral   Take 25 mg by mouth every morning.          . cholecalciferol (VITAMIN D) 1000 UNITS tablet   Oral   Take 1,000 Units by mouth every morning.          Marland Kitchen Cod Liver Oil CAPS   Oral   Take 1 capsule by mouth every morning.          . cyclobenzaprine (FLEXERIL) 10 MG tablet   Oral   Take 10 mg by mouth 3 (three) times daily as needed. Muscle spasms.         . diphenhydrAMINE (BENADRYL) 25 MG tablet   Oral   Take 25 mg by mouth every 6 (six) hours as needed. Allergies         . docusate sodium (COLACE) 100 MG capsule   Oral   Take 1 capsule (100 mg total) by mouth every 12 (twelve) hours.  60 capsule   0   . famotidine (PEPCID) 20 MG tablet   Oral   Take 1 tablet (20 mg total) by mouth 2 (two) times daily.   60 tablet   1   . fluticasone (FLONASE) 50 MCG/ACT nasal spray   Nasal   Place 2 sprays into the nose daily.   16 g   1   . guaiFENesin (MUCINEX) 600 MG 12 hr tablet   Oral   Take 1,200 mg by mouth 2 (two) times daily. For congestion.         . hydrocortisone (ANUSOL-HC) 25 MG suppository   Rectal   Place 1 suppository (25 mg total) rectally 2 (two) times daily.   12 suppository   0   . levofloxacin (LEVAQUIN) 750 MG tablet   Oral   Take 1 tablet (750 mg total) by mouth daily.   10 tablet   0   . lidocaine (LIDODERM) 5 %   Transdermal   Place 1 patch onto the skin daily. Remove & Discard patch within 12 hours or as directed by MD         . metoprolol tartrate (LOPRESSOR) 12.5 mg TABS   Oral   Take 0.5 tablets (12.5 mg total) by mouth every 12 (twelve) hours.   60 tablet   0   . Multiple Vitamins-Minerals (MULTIVITAMIN WITH MINERALS) tablet   Oral   Take 1 tablet by mouth every morning.          Marland Kitchen omeprazole (PRILOSEC) 20 MG  capsule   Oral   Take 20 mg by mouth every morning.          . predniSONE (DELTASONE) 20 MG tablet      Take 6 tabs daily x 2 days, 5 tabs x 2 days, 4 tabs x 2 days, 3 tabs x 2 days, 2 tabs daily until follow up with pulmonology.   50 tablet   0   . pregabalin (LYRICA) 200 MG capsule   Oral   Take 200 mg by mouth 2 (two) times daily.         . sodium chloride (OCEAN) 0.65 % SOLN nasal spray   Nasal   Place 2 sprays into the nose 4 (four) times daily.         . traZODone (DESYREL) 100 MG tablet   Oral   Take 100 mg by mouth at bedtime. sleep         . vitamin C (ASCORBIC ACID) 250 MG tablet   Oral   Take 250 mg by mouth every morning.            BP 134/86  Pulse 106  Temp(Src) 98.1 F (36.7 C) (Oral)  Resp 18  SpO2 98%  Physical Exam  Nursing note and vitals reviewed. Constitutional: He is oriented to person, place, and time. He appears well-developed and well-nourished.  HENT:  Head: Normocephalic and atraumatic.  Right Ear: External ear normal.  Left Ear: External ear normal.  No oral mucosal swelling noted  Eyes: Conjunctivae and EOM are normal. Pupils are equal, round, and reactive to light.  Cardiovascular: Normal rate and regular rhythm.   Pulmonary/Chest: He has wheezes.  Abdominal: Soft. Bowel sounds are normal. There is no tenderness.  Musculoskeletal:  Swelling noted to the right ankle and calf  Neurological: He is alert and oriented to person, place, and time.  Skin: Skin is warm and dry.  Psychiatric: He has a normal mood and affect.  ED Course  Procedures (including critical care time)  Labs Reviewed  CBC WITH DIFFERENTIAL - Abnormal; Notable for the following:    Hemoglobin 10.7 (*)    HCT 34.3 (*)    MCH 25.1 (*)    Platelets 485 (*)    Lymphocytes Relative 11 (*)    Monocytes Relative 16 (*)    Monocytes Absolute 1.1 (*)    All other components within normal limits  COMPREHENSIVE METABOLIC PANEL - Abnormal; Notable for the  following:    Glucose, Bld 149 (*)    BUN 29 (*)    Albumin 3.0 (*)    Total Bilirubin 0.2 (*)    All other components within normal limits   Dg Chest 2 View  03/12/2012  *RADIOLOGY REPORT*  Clinical Data: Shortness of breath, leg swelling, sarcoidosis  CHEST - 2 VIEW  Comparison: 03/05/2012  Findings: Chronic perihilar scarring/fibrosis, unchanged.  No superimposed opacities suspicious for pneumonia.  No frank interstitial edema.  No pleural effusion or pneumothorax.  The heart is normal in size.  Mild degenerative changes of the visualized thoracolumbar spine.  IMPRESSION: Chronic perihilar scarring/fibrosis, unchanged, likely related to known sarcoidosis.  No evidence of acute cardiopulmonary disease.   Original Report Authenticated By: Charline Bills, M.D.     Date: 03/12/2012  Rate: 101  Rhythm: sinus tachycardia  QRS Axis: normal  Intervals: normal  ST/T Wave abnormalities: normal  Conduction Disutrbances:none  Narrative Interpretation:   Old EKG Reviewed: unchanged    1. Swelling of limb, right   2. Wheezing       MDM  Doppler study is negative:pt is no longer wheezing after treatment:bun up slightly will given some fluid:pt has follow up with pcp this week;history  And exam not consistent with copd exacerbation or chf:no sign of allergic reaction noted        Teressa Lower, NP 03/12/12 1536

## 2012-03-12 NOTE — ED Notes (Signed)
Pt states was recently discharged from hospital this past Wednesday, last night developed ankle swelling/reddness, shortness of breath, usually wears home oxgyen, and tongue swelling, states he thinks he's having a allergic reaction but unsure to what medication.

## 2012-03-12 NOTE — Progress Notes (Signed)
*  PRELIMINARY RESULTS* Vascular Ultrasound Right lower extremity venous duplex has been completed.  Preliminary findings: Right:  No evidence of DVT, superficial thrombosis, or Baker's cyst.   Farrel Demark, RDMS, RVT  03/12/2012, 2:56 PM

## 2012-03-17 ENCOUNTER — Inpatient Hospital Stay: Payer: 59 | Admitting: Adult Health

## 2012-03-27 IMAGING — CR DG CHEST 1V PORT
1 series · 1 of 1 positions shown · non-contrast
Comparison: 10/21/2010

CLINICAL DATA: Acute shortness of breath and cough.  History of
sarcoidosis.

PORTABLE CHEST - 1 VIEW

[AP]
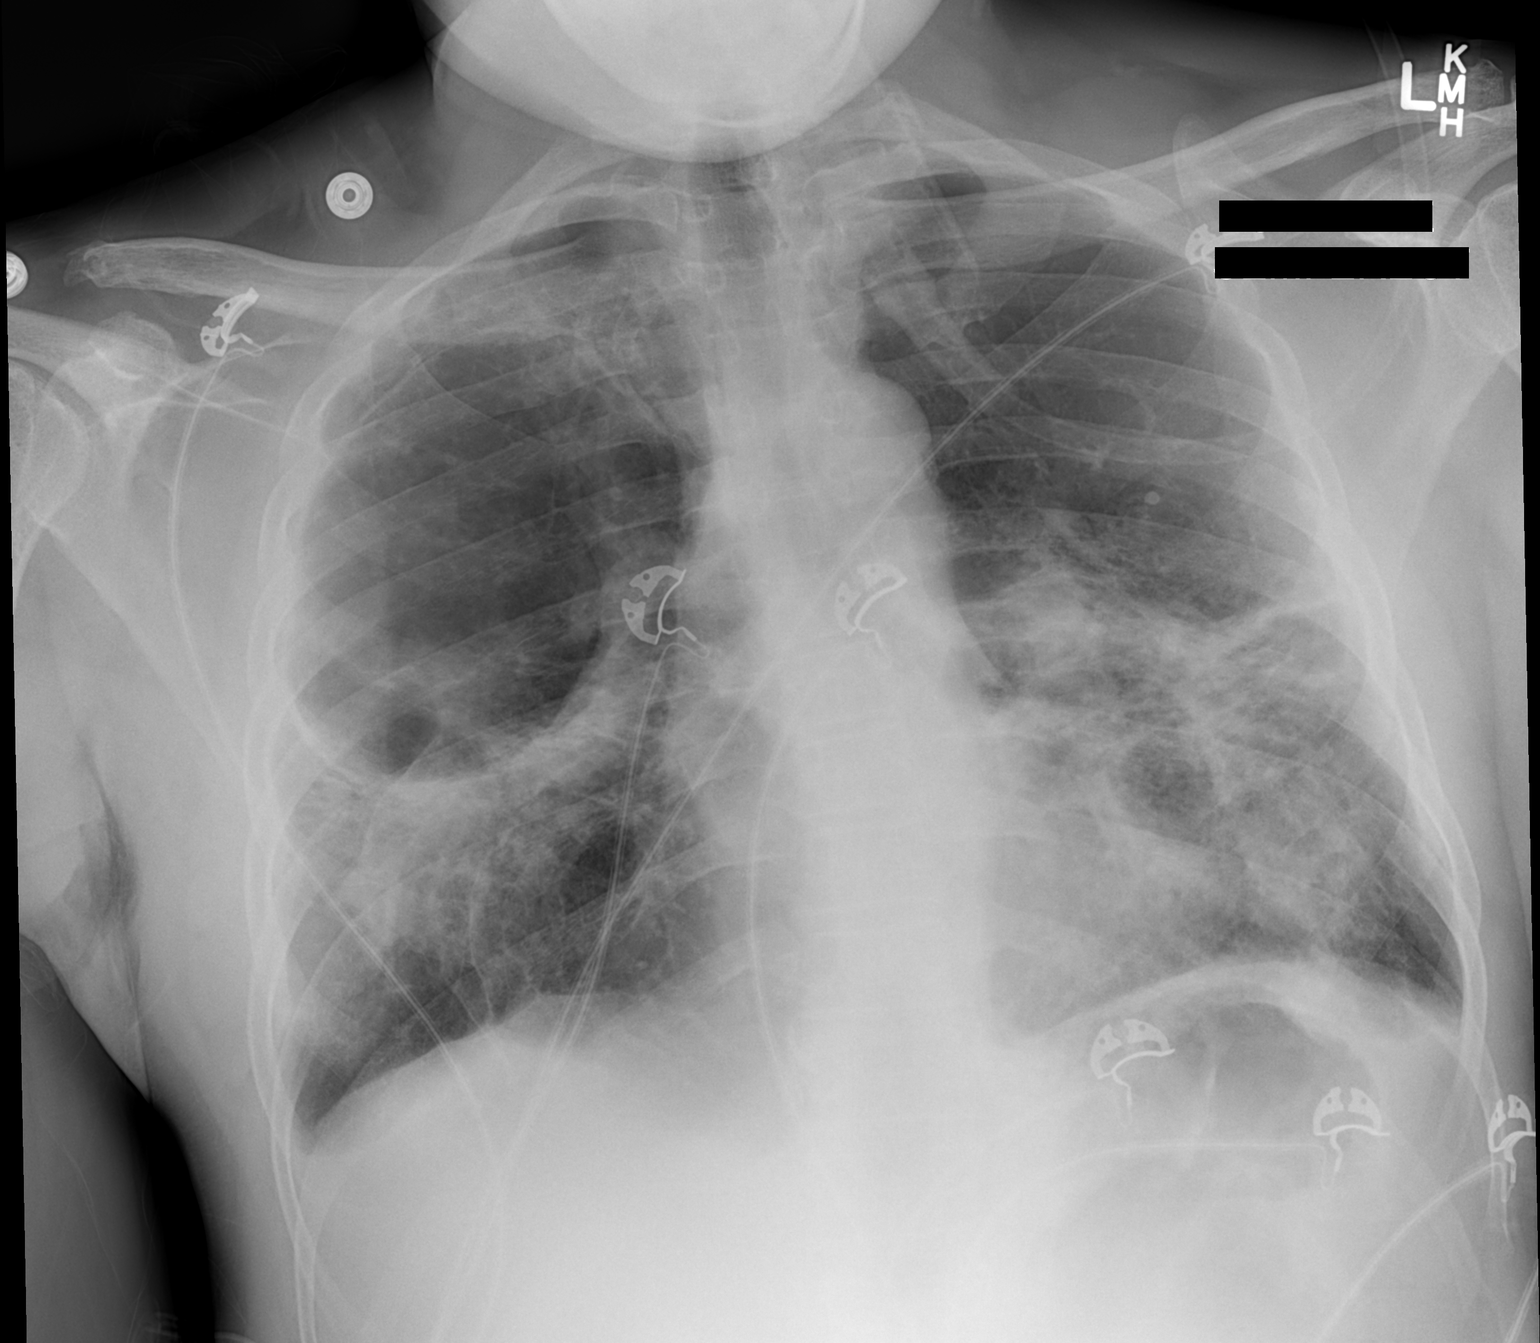

[1 of 1 positions shown; findings below may reference images not displayed]

FINDINGS: Normal heart size and pulmonary vascularity.  Diffuse
bilateral interstitial fibrosis in the lungs with a few scattered
calcified granulomas.  A large bulla or cyst in the right upper
lung.  Mild blunting of right costophrenic angle suggesting pleural
thickening.  Changes are similar to previous study, allowing for
technical differences.  No evidence of any developing
consolidation.  Resection or resorption of the distal right
clavicle.
IMPRESSION: Chronic fibrosis and bullous changes in the lungs, stable since the
previous study.

## 2012-03-28 ENCOUNTER — Inpatient Hospital Stay: Payer: 59 | Admitting: Adult Health

## 2012-04-06 ENCOUNTER — Encounter: Payer: Self-pay | Admitting: Adult Health

## 2012-04-06 ENCOUNTER — Ambulatory Visit (INDEPENDENT_AMBULATORY_CARE_PROVIDER_SITE_OTHER): Payer: 59 | Admitting: Adult Health

## 2012-04-06 VITALS — BP 142/80 | Temp 98.2°F | Ht 67.5 in | Wt 182.4 lb

## 2012-04-06 DIAGNOSIS — J471 Bronchiectasis with (acute) exacerbation: Secondary | ICD-10-CM

## 2012-04-06 DIAGNOSIS — D869 Sarcoidosis, unspecified: Secondary | ICD-10-CM

## 2012-04-06 MED ORDER — PREDNISONE 10 MG PO TABS: ORAL_TABLET | ORAL | Status: DC

## 2012-04-06 NOTE — Patient Instructions (Addendum)
Restart Prednisone 20mg  daily and hold at this dose.  Continue  QVAR 80, 2 puffs twice a day Saline nasal rinses As needed   Begin Allegra 180mg  daily  Continue to use your albuterol nebs as needed Follow with Dr Delton Coombes in 6 weeks We are referring you to pulmonary rehab

## 2012-04-10 NOTE — Progress Notes (Signed)
  Subjective:    Patient ID: [name], male    DOB: 17-May-1965, 47 y.o.   MRN: [id number]  HPI 47 yo man, never smoker, hx sarcoidosis dx by transbronchial bx's in 2002 and again in 2011 or 2012, also OSA on CPAP. He has occasional flares that sound asthmatic in nature, gets treated with prednisone. He flares typically a few times a year, but he has also gone for over a year without an exacerbation. He has never been on everyday meds, has been on pred with exacerbations - longest he was on it was 90 days. He has also been on a steroid-sparing agent before (?MTX), but it was stopped. He has a large cavity in R lung, scattered infiltrates. He has been on Advair before, Spiriva before, not currently. Has albuterol available prn, uses almost every day. Last PFT were last year at [facility].   ROV 01/06/12 -- sarcoidosis, R lung cavitary disease. Was recently hospitalized for an acute exacerbation. D/c after pred taper. Last CT scan was 4/13. He is using nebs 2 -3 x a day.   04/06/12  Post Hospital follow up  Patient presents for a post hospital followup. Patient was recently admitted with acute bronchiectasis, exacerbation, complicated by underlying sarcoidosis. Patient has had several recent hospitalizations and emergency room visits.   He was treated with aggressive pulmonary hygiene, antibiotics, and a steroid taper. Since discharge pt reports breathing is doing well overall but still wheezing, SOB, head congestion and prod cough with clear  Mucus .  Patient denies any hemoptysis, orthopnea, PND, or leg swelling. During admission. Patient has had cocaine, positive urine drug screen in his past. He did receive a social work consult. Patient denies any drug use at today's visit.  He was discharged on prednisone to recommend to hold at 20 mg however, patient misunderstood instructions and has stopped all steroids.   ROS Neg except for HPI        Objective:   Physical Exam   Gen: Pleasant,  well-nourished, in no distress,  normal affect  ENT: No lesions,  mouth clear,  oropharynx clear, no postnasal drip  Neck: No JVD, no TMG, no carotid bruits  Lungs: No use of accessory muscles, distant, no wheezes or crackles  Cardiovascular: RRR, heart sounds normal, no murmur or gallops, no peripheral edema  Musculoskeletal: No deformities, no cyanosis or clubbing  Neuro: alert, non focal  Skin: Warm, no lesions or rashes     Assessment & Plan:

## 2012-04-10 NOTE — Assessment & Plan Note (Signed)
Recent exacerbation , complicated by underlying sarcoid.   Plan  Restart Prednisone 20mg  daily and hold at this dose.  Continue  QVAR 80, 2 puffs twice a day Saline nasal rinses As needed   Begin Allegra 180mg  daily  Continue to use your albuterol nebs as needed Follow with Dr Delton Coombes in 6 weeks We are referring you to pulmonary rehab

## 2012-04-28 ENCOUNTER — Telehealth: Payer: Self-pay | Admitting: Pulmonary Disease

## 2012-04-28 NOTE — Telephone Encounter (Signed)
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 He is "allergic to clindamycin" he says;  I called in augmentin 875mg  one Bid x10d... He knows to f/u w/ DrByrum... SM[facility]

## 2012-05-08 ENCOUNTER — Other Ambulatory Visit: Payer: 59

## 2012-05-18 ENCOUNTER — Encounter: Payer: Self-pay | Admitting: Emergency Medicine

## 2012-05-18 ENCOUNTER — Ambulatory Visit (INDEPENDENT_AMBULATORY_CARE_PROVIDER_SITE_OTHER): Payer: 59 | Admitting: Emergency Medicine

## 2012-05-18 VITALS — BP 140/100 | HR 93 | Ht 67.5 in | Wt 176.4 lb

## 2012-05-18 DIAGNOSIS — G4733 Obstructive sleep apnea (adult) (pediatric): Secondary | ICD-10-CM

## 2012-05-18 DIAGNOSIS — D869 Sarcoidosis, unspecified: Secondary | ICD-10-CM

## 2012-05-18 MED ORDER — PREDNISONE 10 MG PO TABS: ORAL_TABLET | ORAL | Status: DC

## 2012-05-18 MED ORDER — DOXYCYCLINE HYCLATE 100 MG PO TABS: 100.0000 mg | ORAL_TABLET | Freq: Two times a day (BID) | ORAL | Status: DC

## 2012-05-18 NOTE — Assessment & Plan Note (Signed)
Continue CPAP.  

## 2012-05-18 NOTE — Assessment & Plan Note (Addendum)
Clinically stable on current regimen Requesting pred and abx to take w him on trip in case he gets sick Needs repeat CT scan chest in a month to assess for resolution from hospitalization He will see transplant clinic at [facility] in June

## 2012-05-18 NOTE — Patient Instructions (Addendum)
Please continue your inhaled medications  We will f/u in 1 month, plan timing of your repeat CT scan chest Follow with [facility] as planned

## 2012-05-18 NOTE — Progress Notes (Signed)
  Subjective:    Patient ID: [name], male    DOB: 21-Sep-1965, 47 y.o.   MRN: [id number]  HPI 47 yo man, never smoker, hx sarcoidosis dx by transbronchial bx's in 2002 and again in 2011 or 2012, also OSA on CPAP. He has occasional flares that sound asthmatic in nature, gets treated with prednisone. He flares typically a few times a year, but he has also gone for over a year without an exacerbation. He has never been on everyday meds, has been on pred with exacerbations - longest he was on it was 90 days. He has also been on a steroid-sparing agent before (?MTX), but it was stopped. He has a large cavity in R lung, scattered infiltrates. He has been on Advair before, Spiriva before, not currently. Has albuterol available prn, uses almost every day. Last PFT were last year at [facility].   ROV 01/06/12 -- sarcoidosis, R lung cavitary disease. Was recently hospitalized for an acute exacerbation. D/c after pred taper. Last CT scan was 4/13. He is using nebs 2 -3 x a day.   04/06/12  Post Hospital follow up  Patient presents for a post hospital followup. Patient was recently admitted with acute bronchiectasis, exacerbation, complicated by underlying sarcoidosis. Patient has had several recent hospitalizations and emergency room visits.   He was treated with aggressive pulmonary hygiene, antibiotics, and a steroid taper. Since discharge pt reports breathing is doing well overall but still wheezing, SOB, head congestion and prod cough with clear  Mucus .  Patient denies any hemoptysis, orthopnea, PND, or leg swelling. During admission. Patient has had cocaine, positive urine drug screen in his past. He did receive a social work consult. Patient denies any drug use at today's visit.  He was discharged on prednisone to recommend to hold at 20 mg however, patient misunderstood instructions and has stopped all steroids.  ROV 05/18/12 -- sarcoidosis, bronchiectasis and R cavitary disease. Regular f/u visit. Has  been doing well, some intermittent SOB. Taking allegra and benadryl. He is on QVAR daily, off prednisone. Uses SABA rarely. He is reliable w CPAP.      Objective:   Physical Exam  Filed Vitals:   05/18/12 1444  BP: 140/100  Pulse: 93  Height: 5' 7.5" (1.715 m)  Weight: 176 lb 6.4 oz (80.015 kg)  SpO2: 94%    Gen: Pleasant, well-nourished, in no distress,  normal affect  ENT: No lesions,  mouth clear,  oropharynx clear, no postnasal drip  Neck: No JVD, no TMG, no carotid bruits  Lungs: No use of accessory muscles, distant, no wheezes or crackles  Cardiovascular: RRR, heart sounds normal, no murmur or gallops, no peripheral edema  Musculoskeletal: No deformities, no cyanosis or clubbing  Neuro: alert, non focal  Skin: Warm, no lesions or rashes     Assessment & Plan:  Sarcoid Clinically stable on current regimen Requesting pred and abx to take w him on trip in case he gets sick Needs repeat CT scan chest in a month to assess for resolution from hospitalization He will see transplant clinic at [facility] in June  OSA on CPAP Continue CPAP

## 2012-06-16 ENCOUNTER — Emergency Department (HOSPITAL_COMMUNITY): Payer: 59

## 2012-06-16 ENCOUNTER — Encounter (HOSPITAL_COMMUNITY): Payer: Self-pay | Admitting: *Deleted

## 2012-06-16 ENCOUNTER — Ambulatory Visit: Payer: 59 | Admitting: Internal Medicine

## 2012-06-16 ENCOUNTER — Inpatient Hospital Stay (HOSPITAL_COMMUNITY)
Admission: EM | Admit: 2012-06-16 | Discharge: 2012-06-18 | DRG: 196 | Disposition: A | Discharge status: Home / Self Care | Payer: 59 | Attending: Internal Medicine | Admitting: Internal Medicine

## 2012-06-16 DIAGNOSIS — J9620 Acute and chronic respiratory failure, unspecified whether with hypoxia or hypercapnia: Secondary | ICD-10-CM | POA: Diagnosis present

## 2012-06-16 DIAGNOSIS — K219 Gastro-esophageal reflux disease without esophagitis: Secondary | ICD-10-CM | POA: Diagnosis present

## 2012-06-16 DIAGNOSIS — D649 Anemia, unspecified: Secondary | ICD-10-CM | POA: Diagnosis present

## 2012-06-16 DIAGNOSIS — J9610 Chronic respiratory failure, unspecified whether with hypoxia or hypercapnia: Secondary | ICD-10-CM | POA: Diagnosis present

## 2012-06-16 DIAGNOSIS — Z79899 Other long term (current) drug therapy: Secondary | ICD-10-CM

## 2012-06-16 DIAGNOSIS — I509 Heart failure, unspecified: Secondary | ICD-10-CM | POA: Diagnosis present

## 2012-06-16 DIAGNOSIS — D509 Iron deficiency anemia, unspecified: Secondary | ICD-10-CM | POA: Diagnosis present

## 2012-06-16 DIAGNOSIS — D869 Sarcoidosis, unspecified: Principal | ICD-10-CM | POA: Diagnosis present

## 2012-06-16 DIAGNOSIS — R0600 Dyspnea, unspecified: Secondary | ICD-10-CM

## 2012-06-16 DIAGNOSIS — I5032 Chronic diastolic (congestive) heart failure: Secondary | ICD-10-CM | POA: Diagnosis present

## 2012-06-16 DIAGNOSIS — Z9981 Dependence on supplemental oxygen: Secondary | ICD-10-CM

## 2012-06-16 DIAGNOSIS — J99 Respiratory disorders in diseases classified elsewhere: Secondary | ICD-10-CM | POA: Diagnosis present

## 2012-06-16 DIAGNOSIS — R0609 Other forms of dyspnea: Secondary | ICD-10-CM

## 2012-06-16 DIAGNOSIS — I10 Essential (primary) hypertension: Secondary | ICD-10-CM | POA: Diagnosis present

## 2012-06-16 DIAGNOSIS — G4733 Obstructive sleep apnea (adult) (pediatric): Secondary | ICD-10-CM | POA: Diagnosis present

## 2012-06-16 LAB — IRON AND TIBC
Iron: 10 ug/dL — ABNORMAL LOW (ref 42–135)
Saturation Ratios: 3 % — ABNORMAL LOW (ref 20–55)
TIBC: 400 ug/dL (ref 215–435)
UIBC: 390 ug/dL (ref 125–400)

## 2012-06-16 LAB — CBC WITH DIFFERENTIAL/PLATELET
Basophils Relative: 0 % (ref 0–1)
Eosinophils Absolute: 0.6 10*3/uL (ref 0.0–0.7)
Eosinophils Relative: 8 % — ABNORMAL HIGH (ref 0–5)
Hemoglobin: 10.4 g/dL — ABNORMAL LOW (ref 13.0–17.0)
Lymphs Abs: 1 10*3/uL (ref 0.7–4.0)
MCH: 25 pg — ABNORMAL LOW (ref 26.0–34.0)
MCHC: 31.4 g/dL (ref 30.0–36.0)
MCV: 79.6 fL (ref 78.0–100.0)
Monocytes Relative: 11 % (ref 3–12)
Neutrophils Relative %: 68 % (ref 43–77)
RBC: 4.16 MIL/uL — ABNORMAL LOW (ref 4.22–5.81)

## 2012-06-16 LAB — COMPREHENSIVE METABOLIC PANEL
Albumin: 3.2 g/dL — ABNORMAL LOW (ref 3.5–5.2)
Alkaline Phosphatase: 95 U/L (ref 39–117)
BUN: 9 mg/dL (ref 6–23)
Calcium: 9.1 mg/dL (ref 8.4–10.5)
Creatinine, Ser: 1.02 mg/dL (ref 0.50–1.35)
GFR calc Af Amer: >90 mL/min (ref >90)
Glucose, Bld: 96 mg/dL (ref 70–99)
Total Protein: 7.1 g/dL (ref 6.0–8.3)

## 2012-06-16 LAB — POCT I-STAT TROPONIN I
Troponin i, poc: 0 ng/mL (ref 0.00–0.08)
Troponin i, poc: 0 ng/mL (ref 0.00–0.08)

## 2012-06-16 LAB — CBC
HCT: 33.3 % — ABNORMAL LOW (ref 39.0–52.0)
Hemoglobin: 10.4 g/dL — ABNORMAL LOW (ref 13.0–17.0)
MCHC: 31.2 g/dL (ref 30.0–36.0)
MCV: 80 fL (ref 78.0–100.0)

## 2012-06-16 LAB — FOLATE: Folate: >20 ng/mL

## 2012-06-16 LAB — CREATININE, SERUM: GFR calc non Af Amer: >90 mL/min (ref >90)

## 2012-06-16 LAB — RETICULOCYTES: Retic Count, Absolute: 42 10*3/uL (ref 19.0–186.0)

## 2012-06-16 LAB — FERRITIN: Ferritin: 55 ng/mL (ref 22–322)

## 2012-06-16 LAB — D-DIMER, QUANTITATIVE: D-Dimer, Quant: 0.55 ug{FEU}/mL — ABNORMAL HIGH (ref 0.00–0.48)

## 2012-06-16 MED ORDER — CYCLOBENZAPRINE HCL 10 MG PO TABS
10.0000 mg | ORAL_TABLET | Freq: Three times a day (TID) | ORAL | Status: DC | PRN
  Filled 2012-06-16: qty 1

## 2012-06-16 MED ORDER — METOPROLOL TARTRATE 25 MG PO TABS
25.0000 mg | ORAL_TABLET | Freq: Two times a day (BID) | ORAL | Status: DC
  Administered 2012-06-16 – 2012-06-18 (×4): 25 mg via ORAL
  Filled 2012-06-16 (×5): qty 1

## 2012-06-16 MED ORDER — FAMOTIDINE 20 MG PO TABS
20.0000 mg | ORAL_TABLET | Freq: Two times a day (BID) | ORAL | Status: DC
  Administered 2012-06-16 – 2012-06-18 (×4): 20 mg via ORAL
  Filled 2012-06-16 (×5): qty 1

## 2012-06-16 MED ORDER — PANTOPRAZOLE SODIUM 40 MG PO TBEC
40.0000 mg | DELAYED_RELEASE_TABLET | Freq: Every day | ORAL | Status: DC
  Administered 2012-06-17 – 2012-06-18 (×2): 40 mg via ORAL
  Filled 2012-06-16 (×2): qty 1

## 2012-06-16 MED ORDER — VITAMIN D3 25 MCG (1000 UNIT) PO TABS
1000.0000 [IU] | ORAL_TABLET | Freq: Every morning | ORAL | Status: DC
  Administered 2012-06-16 – 2012-06-18 (×3): 1000 [IU] via ORAL
  Filled 2012-06-16 (×3): qty 1

## 2012-06-16 MED ORDER — ADULT MULTIVITAMIN W/MINERALS CH
1.0000 | ORAL_TABLET | Freq: Every morning | ORAL | Status: DC
  Administered 2012-06-16 – 2012-06-18 (×3): 1 via ORAL
  Filled 2012-06-16 (×3): qty 1

## 2012-06-16 MED ORDER — MORPHINE SULFATE 4 MG/ML IJ SOLN
4.0000 mg | Freq: Once | INTRAMUSCULAR | Status: AC
  Administered 2012-06-16: 4 mg via INTRAVENOUS
  Filled 2012-06-16: qty 1

## 2012-06-16 MED ORDER — ACETAMINOPHEN 325 MG PO TABS
650.0000 mg | ORAL_TABLET | Freq: Four times a day (QID) | ORAL | Status: DC | PRN
  Administered 2012-06-17: 650 mg via ORAL
  Filled 2012-06-16: qty 2

## 2012-06-16 MED ORDER — CHLORTHALIDONE 25 MG PO TABS
25.0000 mg | ORAL_TABLET | Freq: Every morning | ORAL | Status: DC
  Administered 2012-06-17 – 2012-06-18 (×2): 25 mg via ORAL
  Filled 2012-06-16 (×2): qty 1

## 2012-06-16 MED ORDER — MORPHINE SULFATE 4 MG/ML IJ SOLN
4.0000 mg | Freq: Once | INTRAMUSCULAR | Status: AC
  Administered 2012-06-16: 4 mg via INTRAVENOUS

## 2012-06-16 MED ORDER — GUAIFENESIN ER 600 MG PO TB12
1200.0000 mg | ORAL_TABLET | Freq: Two times a day (BID) | ORAL | Status: DC
  Administered 2012-06-16 – 2012-06-18 (×4): 1200 mg via ORAL
  Filled 2012-06-16 (×5): qty 2

## 2012-06-16 MED ORDER — IPRATROPIUM BROMIDE 0.02 % IN SOLN
0.5000 mg | Freq: Once | RESPIRATORY_TRACT | Status: AC
  Administered 2012-06-16: 0.5 mg via RESPIRATORY_TRACT
  Filled 2012-06-16 (×2): qty 2.5

## 2012-06-16 MED ORDER — ALBUTEROL SULFATE (5 MG/ML) 0.5% IN NEBU
2.5000 mg | INHALATION_SOLUTION | RESPIRATORY_TRACT | Status: DC | PRN
  Administered 2012-06-17: 2.5 mg via RESPIRATORY_TRACT
  Filled 2012-06-16: qty 0.5

## 2012-06-16 MED ORDER — MORPHINE SULFATE 4 MG/ML IJ SOLN
INTRAMUSCULAR | Status: AC
  Filled 2012-06-16: qty 1

## 2012-06-16 MED ORDER — LEVOFLOXACIN IN D5W 750 MG/150ML IV SOLN
750.0000 mg | INTRAVENOUS | Status: DC
  Administered 2012-06-17: 750 mg via INTRAVENOUS
  Filled 2012-06-16 (×2): qty 150

## 2012-06-16 MED ORDER — ALBUTEROL SULFATE (5 MG/ML) 0.5% IN NEBU
15.0000 mg | INHALATION_SOLUTION | Freq: Once | RESPIRATORY_TRACT | Status: AC
  Administered 2012-06-16: 15 mg via RESPIRATORY_TRACT
  Filled 2012-06-16 (×2): qty 3

## 2012-06-16 MED ORDER — LEVOFLOXACIN IN D5W 500 MG/100ML IV SOLN
500.0000 mg | Freq: Once | INTRAVENOUS | Status: AC
  Administered 2012-06-16: 500 mg via INTRAVENOUS
  Filled 2012-06-16: qty 100

## 2012-06-16 MED ORDER — ALBUTEROL SULFATE (5 MG/ML) 0.5% IN NEBU: 2.5000 mg | INHALATION_SOLUTION | RESPIRATORY_TRACT | Status: DC | PRN

## 2012-06-16 MED ORDER — HEPARIN SODIUM (PORCINE) 5000 UNIT/ML IJ SOLN
5000.0000 [IU] | Freq: Three times a day (TID) | INTRAMUSCULAR | Status: DC
  Administered 2012-06-16 – 2012-06-18 (×5): 5000 [IU] via SUBCUTANEOUS
  Filled 2012-06-16 (×8): qty 1

## 2012-06-16 MED ORDER — SALINE SPRAY 0.65 % NA SOLN
2.0000 | Freq: Four times a day (QID) | NASAL | Status: DC
  Administered 2012-06-17 – 2012-06-18 (×5): 2 via NASAL
  Filled 2012-06-16: qty 44

## 2012-06-16 MED ORDER — ALBUTEROL SULFATE (5 MG/ML) 0.5% IN NEBU
2.5000 mg | INHALATION_SOLUTION | Freq: Four times a day (QID) | RESPIRATORY_TRACT | Status: DC
  Administered 2012-06-16 – 2012-06-18 (×6): 2.5 mg via RESPIRATORY_TRACT
  Filled 2012-06-16 (×6): qty 0.5

## 2012-06-16 MED ORDER — SODIUM CHLORIDE 0.9 % IV SOLN: 250.0000 mL | INTRAVENOUS | Status: DC | PRN

## 2012-06-16 MED ORDER — PREGABALIN 50 MG PO CAPS
200.0000 mg | ORAL_CAPSULE | Freq: Two times a day (BID) | ORAL | Status: DC
  Administered 2012-06-16 – 2012-06-18 (×4): 200 mg via ORAL
  Filled 2012-06-16 (×4): qty 4

## 2012-06-16 MED ORDER — PREDNISONE 20 MG PO TABS
60.0000 mg | ORAL_TABLET | Freq: Once | ORAL | Status: AC
  Administered 2012-06-16: 60 mg via ORAL
  Filled 2012-06-16: qty 3

## 2012-06-16 MED ORDER — DIPHENHYDRAMINE HCL 25 MG PO CAPS
25.0000 mg | ORAL_CAPSULE | ORAL | Status: DC | PRN
  Filled 2012-06-16: qty 1

## 2012-06-16 MED ORDER — DOCUSATE SODIUM 100 MG PO CAPS
100.0000 mg | ORAL_CAPSULE | Freq: Two times a day (BID) | ORAL | Status: DC
  Administered 2012-06-16 – 2012-06-18 (×4): 100 mg via ORAL
  Filled 2012-06-16 (×5): qty 1

## 2012-06-16 MED ORDER — GUAIFENESIN-DM 100-10 MG/5ML PO SYRP: 5.0000 mL | ORAL_SOLUTION | ORAL | Status: DC | PRN

## 2012-06-16 MED ORDER — PROMETHAZINE HCL 25 MG PO TABS: 25.0000 mg | ORAL_TABLET | Freq: Four times a day (QID) | ORAL | Status: DC | PRN

## 2012-06-16 MED ORDER — OXYCODONE HCL 5 MG PO TABS
5.0000 mg | ORAL_TABLET | ORAL | Status: DC | PRN
  Administered 2012-06-17: 5 mg via ORAL
  Filled 2012-06-16: qty 1

## 2012-06-16 MED ORDER — SODIUM CHLORIDE 0.9 % IJ SOLN: 3.0000 mL | INTRAMUSCULAR | Status: DC | PRN

## 2012-06-16 MED ORDER — ALUM & MAG HYDROXIDE-SIMETH 200-200-20 MG/5ML PO SUSP: 30.0000 mL | Freq: Four times a day (QID) | ORAL | Status: DC | PRN

## 2012-06-16 MED ORDER — TRAZODONE HCL 100 MG PO TABS
100.0000 mg | ORAL_TABLET | Freq: Every day | ORAL | Status: DC
  Administered 2012-06-16 – 2012-06-17 (×2): 100 mg via ORAL
  Filled 2012-06-16 (×3): qty 1

## 2012-06-16 MED ORDER — FLUTICASONE PROPIONATE HFA 44 MCG/ACT IN AERO
1.0000 | INHALATION_SPRAY | Freq: Two times a day (BID) | RESPIRATORY_TRACT | Status: DC
  Administered 2012-06-16 – 2012-06-18 (×4): 1 via RESPIRATORY_TRACT
  Filled 2012-06-16: qty 10.6

## 2012-06-16 MED ORDER — FLUTICASONE PROPIONATE 50 MCG/ACT NA SUSP
2.0000 | Freq: Every day | NASAL | Status: DC
  Administered 2012-06-16 – 2012-06-18 (×3): 2 via NASAL
  Filled 2012-06-16: qty 16

## 2012-06-16 MED ORDER — METHYLPREDNISOLONE SODIUM SUCC 40 MG IJ SOLR
40.0000 mg | Freq: Four times a day (QID) | INTRAMUSCULAR | Status: DC
  Administered 2012-06-16 – 2012-06-18 (×7): 40 mg via INTRAVENOUS
  Filled 2012-06-16 (×11): qty 1

## 2012-06-16 MED ORDER — AMITRIPTYLINE HCL 25 MG PO TABS
25.0000 mg | ORAL_TABLET | Freq: Every day | ORAL | Status: DC
  Administered 2012-06-16 – 2012-06-17 (×2): 25 mg via ORAL
  Filled 2012-06-16 (×3): qty 1

## 2012-06-16 MED ORDER — CALCIUM CARBONATE-VITAMIN D 500-200 MG-UNIT PO TABS
1.0000 | ORAL_TABLET | Freq: Every morning | ORAL | Status: DC
  Administered 2012-06-16 – 2012-06-18 (×3): 1 via ORAL
  Filled 2012-06-16 (×3): qty 1

## 2012-06-16 MED ORDER — SODIUM CHLORIDE 0.9 % IJ SOLN
3.0000 mL | Freq: Two times a day (BID) | INTRAMUSCULAR | Status: DC
  Administered 2012-06-17: 3 mL via INTRAVENOUS

## 2012-06-16 MED ORDER — ALBUTEROL SULFATE (5 MG/ML) 0.5% IN NEBU
5.0000 mg | INHALATION_SOLUTION | Freq: Once | RESPIRATORY_TRACT | Status: AC
  Administered 2012-06-16: 5 mg via RESPIRATORY_TRACT
  Filled 2012-06-16 (×2): qty 1

## 2012-06-16 MED ORDER — ACETAMINOPHEN 650 MG RE SUPP: 650.0000 mg | Freq: Four times a day (QID) | RECTAL | Status: DC | PRN

## 2012-06-16 NOTE — Progress Notes (Signed)
Placed patient on CPAP. Pressure setting is at 10 cmH2O per home setting with 2L oxygen bleed in. Patient tolerating well.

## 2012-06-16 NOTE — ED Notes (Signed)
Pt reports hx of sarcoidosis, wears oxygen at home as needed. Reports sob and chest pain started yesterday. Central chest pain 9/10.

## 2012-06-16 NOTE — H&P (Signed)
[facility] History and Physical  [name] YNW:[id number] DOB: [birth year] DOA: 06/16/2012  Referring physician: Dr. Jeraldine Loots PCP: Provider Not In System, Dr. Harriett Sine Denizard Pulmonologist: Dr. Delton Coombes  Chief Complaint: Dyspnea   History of Present Illness: [name] is an 47 y.o. male with a PMH of chronic respiratory failure on home oxygen secondary to sarcoidosis, OSA on CPAP who presented to the ER with a chief complaint of a 24 hour history of shortness of breath, associated with a sporadic dry cough and pleuritic chest tightness but no associated fever or chills.  Has had similar episodes, once a year on average. He tells me that steroids typically help alleviate his symptoms. Denies any known aggravating or alleviating factors. He has had a flu vaccination this year.  Review of Systems: Constitutional: No fever, no chills;  Appetite normal; No weight loss, no weight gain.  HEENT: No blurry vision, no diplopia, + pharyngitis, no dysphagia CV: No chest pain, no palpitations.  Resp: + SOB, + dry cough. GI: No nausea, no vomiting, no diarrhea, no melena, no hematochezia.  GU: No dysuria, no hematuria.  MSK: + myalgias, no arthralgias.  Neuro:  No headache, no focal neurological deficits, no history of seizures.  Psych: No depression, no anxiety.  Endo: No thyroid disease, no DM, no heat intolerance, no cold intolerance, no polyuria, no polydipsia  Skin: No rashes, no skin lesions.  Heme: No easy bruising, no history of blood diseases.  Past Medical History Past Medical History  Diagnosis Date  . Hypertension   . Sarcoidosis   . OSA on CPAP   . Chronic respiratory failure     home oxygen PRN  . Bronchitis   . Pneumonia      Past Surgical History Past Surgical History  Procedure Laterality Date  . Rotator cuff repair    . Arm surgery        Social History: History   Social History  . Marital Status: Divorced    Spouse Name: N/A    Number of Children: 2  . Years of  Education: N/A   Occupational History  . Disabled.    Social History Main Topics  . Smoking status: Never Smoker   . Smokeless tobacco: Never Used  . Alcohol Use: Yes     Comment: social  . Drug Use: No  . Sexually Active: Not on file   Other Topics Concern  . Not on file   Social History Narrative   Divorced.  Lives alone.    Family History:  Family History  Problem Relation Age of Onset  . Diabetes Father     Allergies: Clindamycin/lincomycin  Meds: Prior to Admission medications   Medication Sig Start Date End Date Taking? Authorizing Provider  albuterol (PROVENTIL) (2.5 MG/3ML) 0.083% nebulizer solution 2.5 mg. 1 vial in nebulizer every 6 hours as needed 03/30/12  Yes Historical Provider, MD  amitriptyline (ELAVIL) 25 MG tablet Take 25 mg by mouth at bedtime. For sleep   Yes Historical Provider, MD  beclomethasone (QVAR) 80 MCG/ACT inhaler Inhale 2 puffs into the lungs 2 (two) times daily. 01/06/12  Yes Leslye Peer, MD  calcium-vitamin D (OSCAL WITH D) 500-200 MG-UNIT per tablet Take 1 tablet by mouth every morning.   Yes Historical Provider, MD  chlorthalidone (HYGROTON) 25 MG tablet Take 25 mg by mouth every morning.    Yes Historical Provider, MD  cholecalciferol (VITAMIN D) 1000 UNITS tablet Take 1,000 Units by mouth every morning.    Yes Historical  Provider, MD  cyclobenzaprine (FLEXERIL) 10 MG tablet Take 10 mg by mouth 3 (three) times daily as needed. Muscle spasms.   Yes Historical Provider, MD  diphenhydrAMINE (BENADRYL) 25 MG tablet Take 25 mg by mouth every 6 (six) hours as needed. Allergies   Yes Historical Provider, MD  docusate sodium (COLACE) 100 MG capsule Take 1 capsule (100 mg total) by mouth every 12 (twelve) hours. 02/28/12  Yes Roxy Horseman, PA-C  famotidine (PEPCID) 20 MG tablet Take 1 tablet (20 mg total) by mouth 2 (two) times daily. 03/08/12  Yes Renae Fickle, MD  fluticasone (FLONASE) 50 MCG/ACT nasal spray Place 2 sprays into the nose  daily. 03/08/12  Yes Renae Fickle, MD  guaiFENesin (MUCINEX) 600 MG 12 hr tablet Take 1,200 mg by mouth 2 (two) times daily. For congestion.   Yes Historical Provider, MD  lidocaine (LIDODERM) 5 % Place 1 patch onto the skin daily. Remove & Discard patch within 12 hours or as directed by MD   Yes Historical Provider, MD  metoprolol tartrate (LOPRESSOR) 25 MG tablet 25 mg. Take 1 tablet (25 mg total) by mouth 2 times daily. 03/30/12 03/30/13 Yes Historical Provider, MD  Multiple Vitamins-Minerals (MULTIVITAMIN WITH MINERALS) tablet Take 1 tablet by mouth every morning.    Yes Historical Provider, MD  omeprazole (PRILOSEC) 40 MG capsule 40 mg. Take 1 capsule (40 mg total) by mouth 2 times daily. 11/02/11  Yes Historical Provider, MD  pregabalin (LYRICA) 200 MG capsule Take 200 mg by mouth 2 (two) times daily.   Yes Historical Provider, MD  promethazine (PHENERGAN) 25 MG tablet 25 mg. Take 1 tablet (25 mg total) by mouth every 6 (six) hours as needed for Nausea. 11/02/11  Yes Historical Provider, MD  sodium chloride (OCEAN) 0.65 % SOLN nasal spray Place 2 sprays into the nose 4 (four) times daily. 03/08/12  Yes Renae Fickle, MD  traZODone (DESYREL) 100 MG tablet Take 100 mg by mouth at bedtime. sleep   Yes Historical Provider, MD  albuterol (PROVENTIL HFA;VENTOLIN HFA) 108 (90 BASE) MCG/ACT inhaler Inhale 2 puffs into the lungs every 6 (six) hours as needed. Wheezing 04/13/11 04/12/12  Henderson Cloud, MD    Physical Exam: Filed Vitals:   06/16/12 1142 06/16/12 1144 06/16/12 1233 06/16/12 1606  BP: 131/101 135/103  146/104  Pulse:    94  Temp: 98.7 F (37.1 C)     Resp: 20   19  SpO2: 100%  99% 100%     Physical Exam: Blood pressure 146/104, pulse 94, temperature 98.7 F (37.1 C), resp. rate 19, SpO2 100.00%. Gen: No acute distress. Head: Normocephalic, atraumatic. Eyes: PERRL, EOMI, sclerae nonicteric. Arcus senilis noted. Mouth: Oropharynx clear. Neck: Supple, no thyromegaly,  no lymphadenopathy, no jugular venous distention. Chest: Lungs diminished throughout. CV: Heart sounds are regular, no murmurs, rubs, or gallops. Abdomen: Soft, nontender, nondistended with normal active bowel sounds. Extremities: Extremities are without clubbing, edema, or cyanosis. Skin: Warm and dry. Neuro: Alert and oriented times 3, cranial nerves II through XII grossly intact. Psych: Mood and affect normal.  Labs on Admission:  Basic Metabolic Panel:  Recent Labs Lab 06/16/12 1210  NA 136  K 4.1  CL 100  CO2 28  GLUCOSE 96  BUN 9  CREATININE 1.02  CALCIUM 9.1   Liver Function Tests:  Recent Labs Lab 06/16/12 1210  AST 19  ALT 12  ALKPHOS 95  BILITOT 0.3  PROT 7.1  ALBUMIN 3.2*   CBC:  Recent  Labs Lab 06/16/12 1210  WBC 7.7  NEUTROABS 5.2  HGB 10.4*  HCT 33.1*  MCV 79.6  PLT 353    BNP (last 3 results)  Recent Labs  12/17/11 0110 03/05/12 1015  PROBNP 15.9 179.0*    Radiological Exams on Admission: Dg Chest Portable 1 View  06/16/2012   *RADIOLOGY REPORT*  Clinical Data: Shortness of breath and chest pain.  History of sarcoidosis.  PORTABLE CHEST - 1 VIEW  Comparison: 03/12/2012  Findings: 1222 hours.  The patient is in a lordotic position. Bilateral pleural parenchymal scarring is again noted the and accentuated at the right base secondary to positioning and lower lung volumes. Cardiopericardial silhouette is at upper limits of normal for size. Telemetry leads overlie the chest.  IMPRESSION: Lordotic low volume film with persistent pleural parenchymal changes consistent with a reported history of sarcoidosis.  No new or progressive findings.   Original Report Authenticated By: Kennith Center, M.D.    EKG: Independently reviewed. Sinus rhythm at 93 beats per minute. ST elevations consistent with early repolarization pattern noted.  Assessment/Plan Principal Problem:   Acute-on-chronic respiratory failure secondary to sarcoid flare -Admit, place on  empiric steroids, nebulized bronchodilator therapy, and Levaquin. -Continue CPAP each bedtime. -Continue supplemental oxygen. Active Problems:   OSA on CPAP -Continue CPAP at home settings.   Normocytic anemia -Check an anemia panel.   Hypertension -Continue metoprolol.   GERD (gastroesophageal reflux disease) -Continue Pepcid and PPI.   Code Status: Full. Family Communication: Johnn Hai [phone]. Disposition Plan: Home in 2-3 days.  Time spent: 1 hour.  RAMA,CHRISTINA [facility] Pager [phone]  If 7PM-7AM, please contact night-coverage [email or web address] Password [facility] 06/16/2012, 6:09 PM

## 2012-06-16 NOTE — ED Provider Notes (Signed)
History     CSN: [id number]  Arrival date & time 06/16/12  1134   First MD Initiated Contact with Patient 06/16/12 1135      Chief Complaint  Patient presents with  . Shortness of Breath  . Chest Pain      HPI Patient ports history of sarcoidosis with when necessary oxygen at home.  He reports developing chest pain shortness of breath has been constant since yesterday.  His central chest pressure.  He feels short of breath.  Even minimal exertion seems to worsen his breathing.  He denies lower from edema.  No orthopnea.  No history of coronary artery disease.  His dry breathing treatments at home without improvement in his symptoms.  Her recent long travel or surgery.  No history of DVT or pulmonary embolism.  He does have history of sarcoidosis.  No history of coronary artery disease.  Symptoms are mild to moderate in severity.  Nothing worsens or improves his symptoms except for exertion.   Past Medical History  Diagnosis Date  . Hypertension   . Sarcoidosis   . OSA on CPAP   . Chronic respiratory failure     hom oxygen PRN  . Bronchitis   . Pneumonia     Past Surgical History  Procedure Laterality Date  . Rotator cuff repair    . Arm surgery       Family History  Problem Relation Age of Onset  . Diabetes Father     History  Substance Use Topics  . Smoking status: Never Smoker   . Smokeless tobacco: Never Used  . Alcohol Use: Yes     Comment: social      Review of Systems  All other systems reviewed and are negative.    Allergies  Clindamycin/lincomycin  Home Medications   Current Outpatient Rx  Name  Route  Sig  Dispense  Refill  . EXPIRED: albuterol (PROVENTIL HFA;VENTOLIN HFA) 108 (90 BASE) MCG/ACT inhaler   Inhalation   Inhale 2 puffs into the lungs every 6 (six) hours as needed. Wheezing         . albuterol (PROVENTIL) (2.5 MG/3ML) 0.083% nebulizer solution      2.5 mg. 1 vial in nebulizer every 6 hours as needed         . amitriptyline  (ELAVIL) 25 MG tablet   Oral   Take 25 mg by mouth at bedtime. For sleep         . beclomethasone (QVAR) 80 MCG/ACT inhaler   Inhalation   Inhale 2 puffs into the lungs 2 (two) times daily.         . calcium-vitamin D (OSCAL WITH D) 500-200 MG-UNIT per tablet   Oral   Take 1 tablet by mouth every morning.         . chlorthalidone (HYGROTON) 25 MG tablet   Oral   Take 25 mg by mouth every morning.          . cholecalciferol (VITAMIN D) 1000 UNITS tablet   Oral   Take 1,000 Units by mouth every morning.          . cyclobenzaprine (FLEXERIL) 10 MG tablet   Oral   Take 10 mg by mouth 3 (three) times daily as needed. Muscle spasms.         . diclofenac sodium (VOLTAREN) 1 % GEL      Apply think layer to affected area twice daily as needed         .  diphenhydrAMINE (BENADRYL) 25 MG tablet   Oral   Take 25 mg by mouth every 6 (six) hours as needed. Allergies         . docusate sodium (COLACE) 100 MG capsule   Oral   Take 1 capsule (100 mg total) by mouth every 12 (twelve) hours.   60 capsule   0   . doxycycline (VIBRA-TABS) 100 MG tablet   Oral   Take 1 tablet (100 mg total) by mouth 2 (two) times daily.   14 tablet   0   . famotidine (PEPCID) 20 MG tablet   Oral   Take 1 tablet (20 mg total) by mouth 2 (two) times daily.   60 tablet   1   . fluticasone (FLONASE) 50 MCG/ACT nasal spray   Nasal   Place 2 sprays into the nose daily.   16 g   1   . guaiFENesin (MUCINEX) 600 MG 12 hr tablet   Oral   Take 1,200 mg by mouth 2 (two) times daily. For congestion.         . lidocaine (LIDODERM) 5 %   Transdermal   Place 1 patch onto the skin daily. Remove & Discard patch within 12 hours or as directed by MD         . metoprolol tartrate (LOPRESSOR) 25 MG tablet      25 mg. Take 1 tablet (25 mg total) by mouth 2 times daily.         . Multiple Vitamins-Minerals (MULTIVITAMIN WITH MINERALS) tablet   Oral   Take 1 tablet by mouth every morning.           Marland Kitchen omeprazole (PRILOSEC) 40 MG capsule      40 mg. Take 1 capsule (40 mg total) by mouth 2 times daily.         . predniSONE (DELTASONE) 10 MG tablet      2 tabs daily until seen back in office   60 tablet   1   . predniSONE (DELTASONE) 10 MG tablet      Take 40mg  daily for 3 days, then 30mg  daily for 3 days, then 20mg  daily for 3 days, then 10mg  daily for 3 days, then stop   30 tablet   0   . pregabalin (LYRICA) 200 MG capsule   Oral   Take 200 mg by mouth 2 (two) times daily.         . promethazine (PHENERGAN) 25 MG tablet      25 mg. Take 1 tablet (25 mg total) by mouth every 6 (six) hours as needed for Nausea.         . sodium chloride (OCEAN) 0.65 % SOLN nasal spray   Nasal   Place 2 sprays into the nose 4 (four) times daily.         . traZODone (DESYREL) 100 MG tablet   Oral   Take 100 mg by mouth at bedtime. sleep         . vitamin C (ASCORBIC ACID) 250 MG tablet   Oral   Take 250 mg by mouth every morning.            BP 135/103  Temp(Src) 98.7 F (37.1 C)  Resp 20  SpO2 100%  Physical Exam  Nursing note and vitals reviewed. Constitutional: He is oriented to person, place, and time. He appears well-developed and well-nourished.  HENT:  Head: Normocephalic and atraumatic.  Eyes: EOM are normal.  Neck: Normal range  of motion.  Cardiovascular: Normal rate, regular rhythm, normal heart sounds and intact distal pulses.   Pulmonary/Chest: Effort normal and breath sounds normal.  Tachypnea.  Decreased breath sounds throughout lung fields  Abdominal: Soft. He exhibits no distension. There is no tenderness.  Genitourinary: Rectum normal.  Musculoskeletal: Normal range of motion.  Neurological: He is alert and oriented to person, place, and time.  Skin: Skin is warm and dry.  Psychiatric: He has a normal mood and affect. Judgment normal.    ED Course  Procedures (including critical care time)   Date: 06/16/2012  1141  Rate: 93    Rhythm: normal sinus rhythm  QRS Axis: normal  Intervals: normal  ST/T Wave abnormalities: Nonspecific ST changes likely early re\re pole versus PR depression, no reciprocal changes  Conduction Disutrbances: none  Narrative Interpretation:   Old EKG Reviewed: No significant changes noted    Date: 06/16/2012 1146  Rate: 93  Rhythm: normal sinus rhythm  QRS Axis: normal  Intervals: normal  ST/T Wave abnormalities: Nonspecific ST changes likely early re\re pole versus PR depression, no reciprocal changes   Conduction Disutrbances: none  Narrative Interpretation:   Old EKG Reviewed: No significant changes noted  EKG reviewed with cardiology over the phone and they agreed this is not consistent with ST elevation    Labs Reviewed - No data to display Dg Chest Portable 1 View  06/16/2012   *RADIOLOGY REPORT*  Clinical Data: Shortness of breath and chest pain.  History of sarcoidosis.  PORTABLE CHEST - 1 VIEW  Comparison: 03/12/2012  Findings: 1222 hours.  The patient is in a lordotic position. Bilateral pleural parenchymal scarring is again noted the and accentuated at the right base secondary to positioning and lower lung volumes. Cardiopericardial silhouette is at upper limits of normal for size. Telemetry leads overlie the chest.  IMPRESSION: Lordotic low volume film with persistent pleural parenchymal changes consistent with a reported history of sarcoidosis.  No new or progressive findings.   Original Report Authenticated By: Kennith Center, M.D.   I personally reviewed the imaging tests through PACS system I reviewed available ER/hospitalization records through the EMR   No diagnosis found.    MDM  4:20 PM Patient continues to feel short of breath after nebulized treatment in the emergency department.  The patient replaced on continuous nebulizer.  Chest x-ray without acute infiltrate.  D-dimer less than 1 consistent with a very low risk for pulmonary embolism.  No indication for CT  scan at this time.  The patient was placed on continuous nebulizer and he will need to be reevaluated after this.  If he improves he should be discharged home with albuterol and prednisone.  If he has not improved he will need to be admitted for ongoing workup. Care to Dr Mariel Craft, MD 06/16/12 [phone]

## 2012-06-17 MED ORDER — MENTHOL 3 MG MT LOZG
1.0000 | LOZENGE | OROMUCOSAL | Status: DC | PRN
  Filled 2012-06-17: qty 9

## 2012-06-17 MED ORDER — AMLODIPINE BESYLATE 10 MG PO TABS
10.0000 mg | ORAL_TABLET | Freq: Every day | ORAL | Status: DC
  Administered 2012-06-17 – 2012-06-18 (×2): 10 mg via ORAL
  Filled 2012-06-17 (×2): qty 1

## 2012-06-17 MED ORDER — MORPHINE SULFATE 2 MG/ML IJ SOLN
2.0000 mg | INTRAMUSCULAR | Status: DC | PRN
  Administered 2012-06-18: 2 mg via INTRAVENOUS
  Filled 2012-06-17: qty 1

## 2012-06-17 MED ORDER — FERROUS SULFATE 325 (65 FE) MG PO TABS
325.0000 mg | ORAL_TABLET | Freq: Two times a day (BID) | ORAL | Status: DC
  Administered 2012-06-17 – 2012-06-18 (×2): 325 mg via ORAL
  Filled 2012-06-17 (×4): qty 1

## 2012-06-17 MED ORDER — HYDRALAZINE HCL 20 MG/ML IJ SOLN: 10.0000 mg | Freq: Four times a day (QID) | INTRAMUSCULAR | Status: DC | PRN

## 2012-06-17 NOTE — Progress Notes (Signed)
Pt coughed up large amt  thick reddish brown phlegm. States does this at home also. States is not sure if is sinus or lung origin.

## 2012-06-17 NOTE — Progress Notes (Addendum)
[facility]                                                                                Patient Demographics  [name], is a 47 y.o. male, DOB - March 17, 1965, UXL:[id number], ZDG:[id number]  Admit date - 06/16/2012  Admitting Physician Maryruth Bun Rama, MD  Outpatient Primary MD for the patient is Provider Not In System  LOS - 1   Chief Complaint  Patient presents with  . Shortness of Breath  . Chest Pain        Assessment & Plan    Acute-on-chronic respiratory failure secondary to sarcoid flare  -He is already much improved, he will be continued on on empiric steroids, nebulized bronchodilator therapy, and Levaquin. Of note he uses 2-3 L of oxygen at home which will be continued at the time, Continue CPAP bedtime for obstructive sleep apnea.      Chronic diastolic CHF with EF around 60% on last echogram done recently in February of 2014   He is clinically compensated from the standpoint, no rales, no edema.    OSA on CPAP  -Continue CPAP at home settings.     Microcytic Anemia  -Borderline anemia panel, stable ferritin, but low saturation ratios with low MCV and low MCH, will start on oral iron supplementation, we'll request PCP to do outpatient monitoring of iron levels and iron deficiency workup.   Hypertension  -Continue metoprolol. Added Norvasc for better control, when necessary hydralazine added.   GERD (gastroesophageal reflux disease)  -Continue Pepcid and PPI.      Code Status: Full  Family Communication: wife  Disposition Plan: Home   Procedures    Consults     DVT Prophylaxis   Heparin   Lab Results  Component Value Date   PLT 337 06/16/2012    Medications  Scheduled Meds: . albuterol  2.5 mg Nebulization QID  . amitriptyline  25 mg Oral QHS  . calcium-vitamin D  1 tablet Oral q morning - 10a  . chlorthalidone  25 mg Oral q morning - 10a  . cholecalciferol  1,000 Units Oral q morning - 10a  . docusate sodium  100  mg Oral Q12H  . famotidine  20 mg Oral BID  . fluticasone  2 spray Each Nare Daily  . fluticasone  1 puff Inhalation BID  . guaiFENesin  1,200 mg Oral BID  . heparin  5,000 Units Subcutaneous Q8H  . levofloxacin (LEVAQUIN) IV  750 mg Intravenous Q24H  . methylPREDNISolone (SOLU-MEDROL) injection  40 mg Intravenous Q6H  . metoprolol tartrate  25 mg Oral BID  . multivitamin with minerals  1 tablet Oral q morning - 10a  . pantoprazole  40 mg Oral Daily  . pregabalin  200 mg Oral BID  . sodium chloride  2 spray Nasal QID  . sodium chloride  3 mL Intravenous Q12H  . traZODone  100 mg Oral QHS   Continuous Infusions:  PRN Meds:.sodium chloride, acetaminophen, acetaminophen, albuterol, alum & mag hydroxide-simeth, cyclobenzaprine, diphenhydrAMINE, guaiFENesin-dextromethorphan, oxyCODONE, promethazine, sodium chloride  Antibiotics     Anti-infectives   Start     Dose/Rate Route Frequency Ordered Stop   06/17/12 1800  levofloxacin (LEVAQUIN) IVPB 750 mg     750 mg 100 mL/hr over 90 Minutes Intravenous Every 24 hours 06/16/12 1841     06/16/12 1745  levofloxacin (LEVAQUIN) IVPB 500 mg     500 mg 100 mL/hr over 60 Minutes Intravenous  Once 06/16/12 1744 06/16/12 1914       Time Spent in minutes  35   Breon Rehm K M.D on 06/17/2012 at 8:15 AM  Between 7am to 7pm - Pager - [phone]  After 7pm go to [email or web address] - password [email or web address]  And look for the night coverage person covering for me after hours  [facility] Office  [phone]    Subjective:   [name] today has, No headache, No chest pain, No abdominal pain - No Nausea, No new weakness tingling or numbness, No Cough - SOB is improving.  Objective:   Filed Vitals:   06/16/12 1843 06/16/12 2152 06/17/12 0600 06/17/12 0750  BP: 150/104 146/97 156/96   Pulse: 100 103 84   Temp: 97.8 F (36.6 C)  97.8 F (36.6 C)   TempSrc: Oral  Oral   Resp: 18 18 18    Height: 5' 7.5" (1.715 m)     Weight: 81.3  kg (179 lb 3.7 oz)     SpO2: 97% 97% 95% 96%    Wt Readings from Last 3 Encounters:  06/16/12 81.3 kg (179 lb 3.7 oz)  05/18/12 80.015 kg (176 lb 6.4 oz)  04/06/12 82.736 kg (182 lb 6.4 oz)    No intake or output data in the 24 hours ending 06/17/12 0815  Exam Awake Alert, Oriented X 3, No new F.N deficits, Normal affect [place].AT,PERRAL Supple Neck,No JVD, No cervical lymphadenopathy appriciated.  Symmetrical Chest wall movement, Good air movement bilaterally, CTAB RRR,No Gallops,Rubs or new Murmurs, No Parasternal Heave +ve B.Sounds, Abd Soft, Non tender, No organomegaly appriciated, No rebound - guarding or rigidity. No Cyanosis, Clubbing or edema, No new Rash or bruise      Data Review   Micro Results No results found for this or any previous visit (from the past 240 hour(s)).  Radiology Reports Dg Chest Portable 1 View  06/16/2012   *RADIOLOGY REPORT*  Clinical Data: Shortness of breath and chest pain.  History of sarcoidosis.  PORTABLE CHEST - 1 VIEW  Comparison: 03/12/2012  Findings: 1222 hours.  The patient is in a lordotic position. Bilateral pleural parenchymal scarring is again noted the and accentuated at the right base secondary to positioning and lower lung volumes. Cardiopericardial silhouette is at upper limits of normal for size. Telemetry leads overlie the chest.  IMPRESSION: Lordotic low volume film with persistent pleural parenchymal changes consistent with a reported history of sarcoidosis.  No new or progressive findings.   Original Report Authenticated By: Kennith Center, M.D.    CBC  Recent Labs Lab 06/16/12 1210 06/16/12 1903  WBC 7.7 7.0  HGB 10.4* 10.4*  HCT 33.1* 33.3*  PLT 353 337  MCV 79.6 80.0  MCH 25.0* 25.0*  MCHC 31.4 31.2  RDW 13.8 13.8  LYMPHSABS 1.0  --   MONOABS 0.9  --   EOSABS 0.6  --   BASOSABS 0.0  --     Chemistries   Recent Labs Lab 06/16/12 1210 06/16/12 1903  NA 136  --   K 4.1  --   CL 100  --   CO2 28  --   GLUCOSE  96  --   BUN 9  --   CREATININE 1.02 0.98  CALCIUM 9.1  --   AST 19  --   ALT 12  --   ALKPHOS 95  --   BILITOT 0.3  --    ------------------------------------------------------------------------------------------------------------------ estimated creatinine clearance is 96.1 ml/min (by C-G formula based on Cr of 0.98). ------------------------------------------------------------------------------------------------------------------ No results found for this basename: HGBA1C,  in the last 72 hours ------------------------------------------------------------------------------------------------------------------ No results found for this basename: CHOL, HDL, LDLCALC, TRIG, CHOLHDL, LDLDIRECT,  in the last 72 hours ------------------------------------------------------------------------------------------------------------------ No results found for this basename: TSH, T4TOTAL, FREET3, T3FREE, THYROIDAB,  in the last 72 hours ------------------------------------------------------------------------------------------------------------------  Recent Labs  06/16/12 1903  VITAMINB12 870  FOLATE >20.0  FERRITIN 55  TIBC 400  IRON 10*  RETICCTPCT 1.0

## 2012-06-18 LAB — TROPONIN I
Troponin I: <0.3 ng/mL (ref <0.30)
Troponin I: <0.3 ng/mL (ref <0.30)

## 2012-06-18 LAB — BASIC METABOLIC PANEL
BUN: 11 mg/dL (ref 6–23)
CO2: 26 mEq/L (ref 19–32)
Chloride: 100 mEq/L (ref 96–112)
GFR calc Af Amer: >90 mL/min (ref >90)
Potassium: 5 mEq/L (ref 3.5–5.1)

## 2012-06-18 MED ORDER — LEVOFLOXACIN 750 MG PO TABS: 750.0000 mg | ORAL_TABLET | Freq: Every day | ORAL | Status: DC

## 2012-06-18 MED ORDER — AMLODIPINE BESYLATE 10 MG PO TABS: 10.0000 mg | ORAL_TABLET | Freq: Every day | ORAL | Status: DC

## 2012-06-18 MED ORDER — PREDNISONE 5 MG PO TABS: ORAL_TABLET | ORAL | Status: DC

## 2012-06-18 MED ORDER — FERROUS SULFATE 325 (65 FE) MG PO TABS: 325.0000 mg | ORAL_TABLET | Freq: Two times a day (BID) | ORAL | Status: DC

## 2012-06-18 MED ORDER — NITROGLYCERIN 0.4 MG SL SUBL: 0.4000 mg | SUBLINGUAL_TABLET | SUBLINGUAL | Status: DC | PRN

## 2012-06-18 NOTE — Care Management Note (Signed)
    Page 1 of 1   06/18/2012     11:04:36 AM   CARE MANAGEMENT NOTE 06/18/2012  Patient:  [name],[name]   Account Number:  [id number]  Date Initiated:  06/18/2012  Documentation initiated by:  Lanier Clam  Subjective/Objective Assessment:   ADMITTED W/RESP FAILURE.     Action/Plan:   FROM HOME.   Anticipated DC Date:  06/18/2012   Anticipated DC Plan:  HOME/SELF CARE      DC Planning Services  CM consult      Choice offered to / List presented to:             Status of service:  Completed, signed off Medicare Important Message given?   (If response is "NO", the following Medicare IM given date fields will be blank) Date Medicare IM given:   Date Additional Medicare IM given:    Discharge Disposition:  HOME/SELF CARE  Per UR Regulation:    If discussed at Long Length of Stay Meetings, dates discussed:    Comments:  06/18/12 Lyndel Sarate RN,BSN NCM WEEKEND [phone] NO HH/DME NEEDS OR ORDERS.

## 2012-06-18 NOTE — Progress Notes (Addendum)
Pt c/o L sided chest pain rated "5",describes achy and tight. Vitals stable BP 145/95 satting 100 3L P 87. S/w attending M. Lynch, will give 2 mg morphine and monitor pt. Will provide other interventions as appropriate.

## 2012-06-18 NOTE — Discharge Summary (Signed)
[facility]                                                                                   [name], is a 47 y.o. male  DOB 27-Mar-1965  MRN [id number].  Admission date:  06/16/2012  Discharge Date:  06/18/2012  Primary MD  Provider Not In System  Admitting Physician  Maryruth Bun Rama, MD  Admission Diagnosis  Dyspnea [786.09]  Discharge Diagnosis     Principal Problem:   Acute-on-chronic respiratory failure secondary to sarcoid flare Active Problems:   OSA on CPAP   Normocytic anemia   Hypertension   GERD (gastroesophageal reflux disease)    Past Medical History  Diagnosis Date  . Hypertension   . Sarcoidosis   . OSA on CPAP   . Chronic respiratory failure     home oxygen PRN  . Bronchitis   . Pneumonia     Past Surgical History  Procedure Laterality Date  . Rotator cuff repair    . Arm surgery        Recommendations for primary care physician for things to follow:   Although iron levels, outpatient iron deficiency anemia workup   Discharge Diagnoses:   Principal Problem:   Acute-on-chronic respiratory failure secondary to sarcoid flare Active Problems:   OSA on CPAP   Normocytic anemia   Hypertension   GERD (gastroesophageal reflux disease)    Discharge Condition: Stable   Diet recommendation: See Discharge Instructions below   Consults     History of present illness and  Hospital Course:     Kindly see H&P for history of present illness and admission details, please review complete Labs, Consult reports and Test reports for all details in brief [name], is a 47 y.o. male, patient with history of attention, microcytic anemia, sarcoidosis with pulmonary involvement agent on 2-3 L oxygen at home, patient on transplant list at [facility], chronic diastolic CHF with EF 60% on last echogram done in February of this year medically compensated this admission, to sleep apnea on CPAP at night, was admitted with acute on chronic respiratory  failure due to exacerbation of his pulmonary sarcoidosis, he had a mild cough, no fevers, he was treated with IV steroids, nebulizer treatments and IV Levaquin with good effect. He now feels as good as his baseline, eager to go home and followup with his primary care physician and pulmonologist. He be discharged home on 4 more days of Levaquin and oral steroid taper.   For his hypertension his home medications were continued and Norvasc was added for better control. Patient was compensated from his chronic diastolic CHF standpoint EF is 40% on echogram February 2014.   He was also found to have microcytic anemia iron deficiency panel was borderline, he was placed on oral iron supplementation, will request primary care physician to please monitor his iron levels, H&H and schedule outpatient GI workup iron deficiency.     Today   Subjective:   [name] today has no headache,no chest abdominal pain,no new weakness tingling or numbness, feels much better wants to go home today.    Objective:   Blood pressure 154/99, pulse 95, temperature 97.4  F (36.3 C), temperature source Oral, resp. rate 18, height 5' 7.5" (1.715 m), weight 81.3 kg (179 lb 3.7 oz), SpO2 93.00%.   Intake/Output Summary (Last 24 hours) at 06/18/12 0917 Last data filed at 06/18/12 0900  Gross per 24 hour  Intake    515 ml  Output      0 ml  Net    515 ml    Exam Awake Alert, Oriented *3, No new F.N deficits, Normal affect [place].AT,PERRAL Supple Neck,No JVD, No cervical lymphadenopathy appriciated.  Symmetrical Chest wall movement, Good air movement bilaterally, CTAB RRR,No Gallops,Rubs or new Murmurs, No Parasternal Heave +ve B.Sounds, Abd Soft, Non tender, No organomegaly appriciated, No rebound -guarding or rigidity. No Cyanosis, Clubbing or edema, No new Rash or bruise  Data Review   Major procedures and Radiology Reports - PLEASE review detailed and final reports for all details in brief -       Dg  Chest Portable 1 View  06/16/2012   *RADIOLOGY REPORT*  Clinical Data: Shortness of breath and chest pain.  History of sarcoidosis.  PORTABLE CHEST - 1 VIEW  Comparison: 03/12/2012  Findings: 1222 hours.  The patient is in a lordotic position. Bilateral pleural parenchymal scarring is again noted the and accentuated at the right base secondary to positioning and lower lung volumes. Cardiopericardial silhouette is at upper limits of normal for size. Telemetry leads overlie the chest.  IMPRESSION: Lordotic low volume film with persistent pleural parenchymal changes consistent with a reported history of sarcoidosis.  No new or progressive findings.   Original Report Authenticated By: Kennith Center, M.D.    Micro Results      No results found for this or any previous visit (from the past 240 hour(s)).   CBC w Diff: Lab Results  Component Value Date   WBC 7.0 06/16/2012   HGB 10.4* 06/16/2012   HCT 33.3* 06/16/2012   PLT 337 06/16/2012   LYMPHOPCT 13 06/16/2012   MONOPCT 11 06/16/2012   EOSPCT 8* 06/16/2012   BASOPCT 0 06/16/2012    CMP: Lab Results  Component Value Date   NA 135 06/18/2012   K 5.0 06/18/2012   CL 100 06/18/2012   CO2 26 06/18/2012   BUN 11 06/18/2012   CREATININE 0.86 06/18/2012   PROT 7.1 06/16/2012   ALBUMIN 3.2* 06/16/2012   BILITOT 0.3 06/16/2012   ALKPHOS 95 06/16/2012   AST 19 06/16/2012   ALT 12 06/16/2012  .   Discharge Instructions     Follow with Primary MD in 7 days please follow with her primary care physician your iron levels, you will need to see a stomach doctor for iron deficiency workup.  Get CBC, CMP, Iron levels checked 7 days by Primary MD and again as instructed by your Primary MD. Get a 2 view Chest X ray done next visit.  Get Medicines reviewed and adjusted.  Please request your Prim.MD to go over all Hospital Tests and Procedure/Radiological results at the follow up, please get all Hospital records sent to your Prim MD by signing hospital release before you go  home.  Activity: As tolerated with Full fall precautions use walker/cane & assistance as needed   Diet:  Heart Healthy.  For Heart failure patients - Check your Weight same time everyday, if you gain over 2 pounds, or you develop in leg swelling, experience more shortness of breath or chest pain, call your Primary MD immediately. Follow Cardiac Low Salt Diet and 1.8 lit/day fluid restriction.  Disposition Home    If you experience worsening of your admission symptoms, develop shortness of breath, life threatening emergency, suicidal or homicidal thoughts you must seek medical attention immediately by calling 911 or calling your MD immediately  if symptoms less severe.  You Must read complete instructions/literature along with all the possible adverse reactions/side effects for all the Medicines you take and that have been prescribed to you. Take any new Medicines after you have completely understood and accpet all the possible adverse reactions/side effects.   Do not drive and provide baby sitting services if your were admitted for syncope or siezures until you have seen by Primary MD or a Neurologist and advised to do so again.  Do not drive when taking Pain medications.    Do not take more than prescribed Pain, Sleep and Anxiety Medications  Special Instructions: If you have smoked or chewed Tobacco  in the last 2 yrs please stop smoking, stop any regular Alcohol  and or any Recreational drug use.  Wear Seat belts while driving.   Please note  You were cared for by a hospitalist during your hospital stay. If you have any questions about your discharge medications or the care you received while you were in the hospital after you are discharged, you can call the unit and asked to speak with the hospitalist on call if the hospitalist that took care of you is not available. Once you are discharged, your primary care physician will handle any further medical issues. Please note that NO  REFILLS for any discharge medications will be authorized once you are discharged, as it is imperative that you return to your primary care physician (or establish a relationship with a primary care physician if you do not have one) for your aftercare needs so that they can reassess your need for medications and monitor your lab values.        Follow-up Information   Follow up with Leslye Peer., MD. Schedule an appointment as soon as possible for a visit in 1 week.   Contact information:   [address] [place] [address] [phone]       Follow up with Stan Head, MD. Schedule an appointment as soon as possible for a visit in 1 week. (Iron deficiency workup)    Contact information:   [address] [place] [address] [phone]       Follow up with Your primary care physician. Schedule an appointment as soon as possible for a visit in 1 week.        Discharge Medications     Medication List    TAKE these medications       albuterol 108 (90 BASE) MCG/ACT inhaler  Commonly known as:  PROVENTIL HFA;VENTOLIN HFA  Inhale 2 puffs into the lungs every 6 (six) hours as needed. Wheezing     albuterol (2.5 MG/3ML) 0.083% nebulizer solution  Commonly known as:  PROVENTIL  2.5 mg. 1 vial in nebulizer every 6 hours as needed     amitriptyline 25 MG tablet  Commonly known as:  ELAVIL  Take 25 mg by mouth at bedtime. For sleep     amLODipine 10 MG tablet  Commonly known as:  NORVASC  Take 1 tablet (10 mg total) by mouth daily.     beclomethasone 80 MCG/ACT inhaler  Commonly known as:  QVAR  Inhale 2 puffs into the lungs 2 (two) times daily.     calcium-vitamin D 500-200 MG-UNIT per tablet  Commonly known as:  OSCAL WITH D  Take 1 tablet by mouth every morning.     chlorthalidone 25 MG tablet  Commonly known as:  HYGROTON  Take 25 mg by mouth every morning.     cholecalciferol 1000 UNITS tablet  Commonly known as:  VITAMIN D  Take 1,000 Units by  mouth every morning.     cyclobenzaprine 10 MG tablet  Commonly known as:  FLEXERIL  Take 10 mg by mouth 3 (three) times daily as needed. Muscle spasms.     diphenhydrAMINE 25 MG tablet  Commonly known as:  BENADRYL  Take 25 mg by mouth every 6 (six) hours as needed. Allergies     docusate sodium 100 MG capsule  Commonly known as:  COLACE  Take 1 capsule (100 mg total) by mouth every 12 (twelve) hours.     famotidine 20 MG tablet  Commonly known as:  PEPCID  Take 1 tablet (20 mg total) by mouth 2 (two) times daily.     ferrous sulfate 325 (65 FE) MG tablet  Take 1 tablet (325 mg total) by mouth 2 (two) times daily with a meal.     fluticasone 50 MCG/ACT nasal spray  Commonly known as:  FLONASE  Place 2 sprays into the nose daily.     guaiFENesin 600 MG 12 hr tablet  Commonly known as:  MUCINEX  Take 1,200 mg by mouth 2 (two) times daily. For congestion.     levofloxacin 750 MG tablet  Commonly known as:  LEVAQUIN  Take 1 tablet (750 mg total) by mouth daily.     lidocaine 5 %  Commonly known as:  LIDODERM  Place 1 patch onto the skin daily. Remove & Discard patch within 12 hours or as directed by MD     metoprolol tartrate 25 MG tablet  Commonly known as:  LOPRESSOR  25 mg. Take 1 tablet (25 mg total) by mouth 2 times daily.     multivitamin with minerals tablet  Take 1 tablet by mouth every morning.     omeprazole 40 MG capsule  Commonly known as:  PRILOSEC  40 mg. Take 1 capsule (40 mg total) by mouth 2 times daily.     predniSONE 5 MG tablet  Commonly known as:  DELTASONE  Label  & dispense according to the schedule below. 10 Pills PO for 3 days then, 8 Pills PO for 3 days, 6 Pills PO for 3 days, 4 Pills PO for 3 days, 2 Pills PO for 3 days, 1 Pills PO for 3 days, 1/2 Pill  PO for 3 days then STOP. Total 95 pills.     pregabalin 200 MG capsule  Commonly known as:  LYRICA  Take 200 mg by mouth 2 (two) times daily.     promethazine 25 MG tablet  Commonly  known as:  PHENERGAN  25 mg. Take 1 tablet (25 mg total) by mouth every 6 (six) hours as needed for Nausea.     sodium chloride 0.65 % Soln nasal spray  Commonly known as:  OCEAN  Place 2 sprays into the nose 4 (four) times daily.     traZODone 100 MG tablet  Commonly known as:  DESYREL  Take 100 mg by mouth at bedtime. sleep           Total Time in preparing paper work, data evaluation and todays exam - 35 minutes  Leroy Sea M.D on 06/18/2012 at 9:17 AM  [facility] Office  [phone]    

## 2012-06-18 NOTE — Progress Notes (Signed)
Pt stated he can place himself on CPAP tonight without assistance. Humidity chamber filled with water and instructed pt to notify if he did need assistance.

## 2012-06-18 NOTE — Progress Notes (Signed)
Pt states pain is now at 0 resting comfortably, no distress. Will continue to monitor.

## 2012-06-18 NOTE — Progress Notes (Signed)
Discharge instructions explained to pt and his wife. Next medications due written on med list. Pt states understanding instructions. Prescriptions given to wife.

## 2012-06-21 IMAGING — CR DG NECK SOFT TISSUE
2 series · 2 of 2 positions shown · non-contrast
Comparison: None.

CLINICAL DATA: Choking.

NECK SOFT TISSUES - 1+ VIEW

[w soft tissue neck lat (1 of 2)]
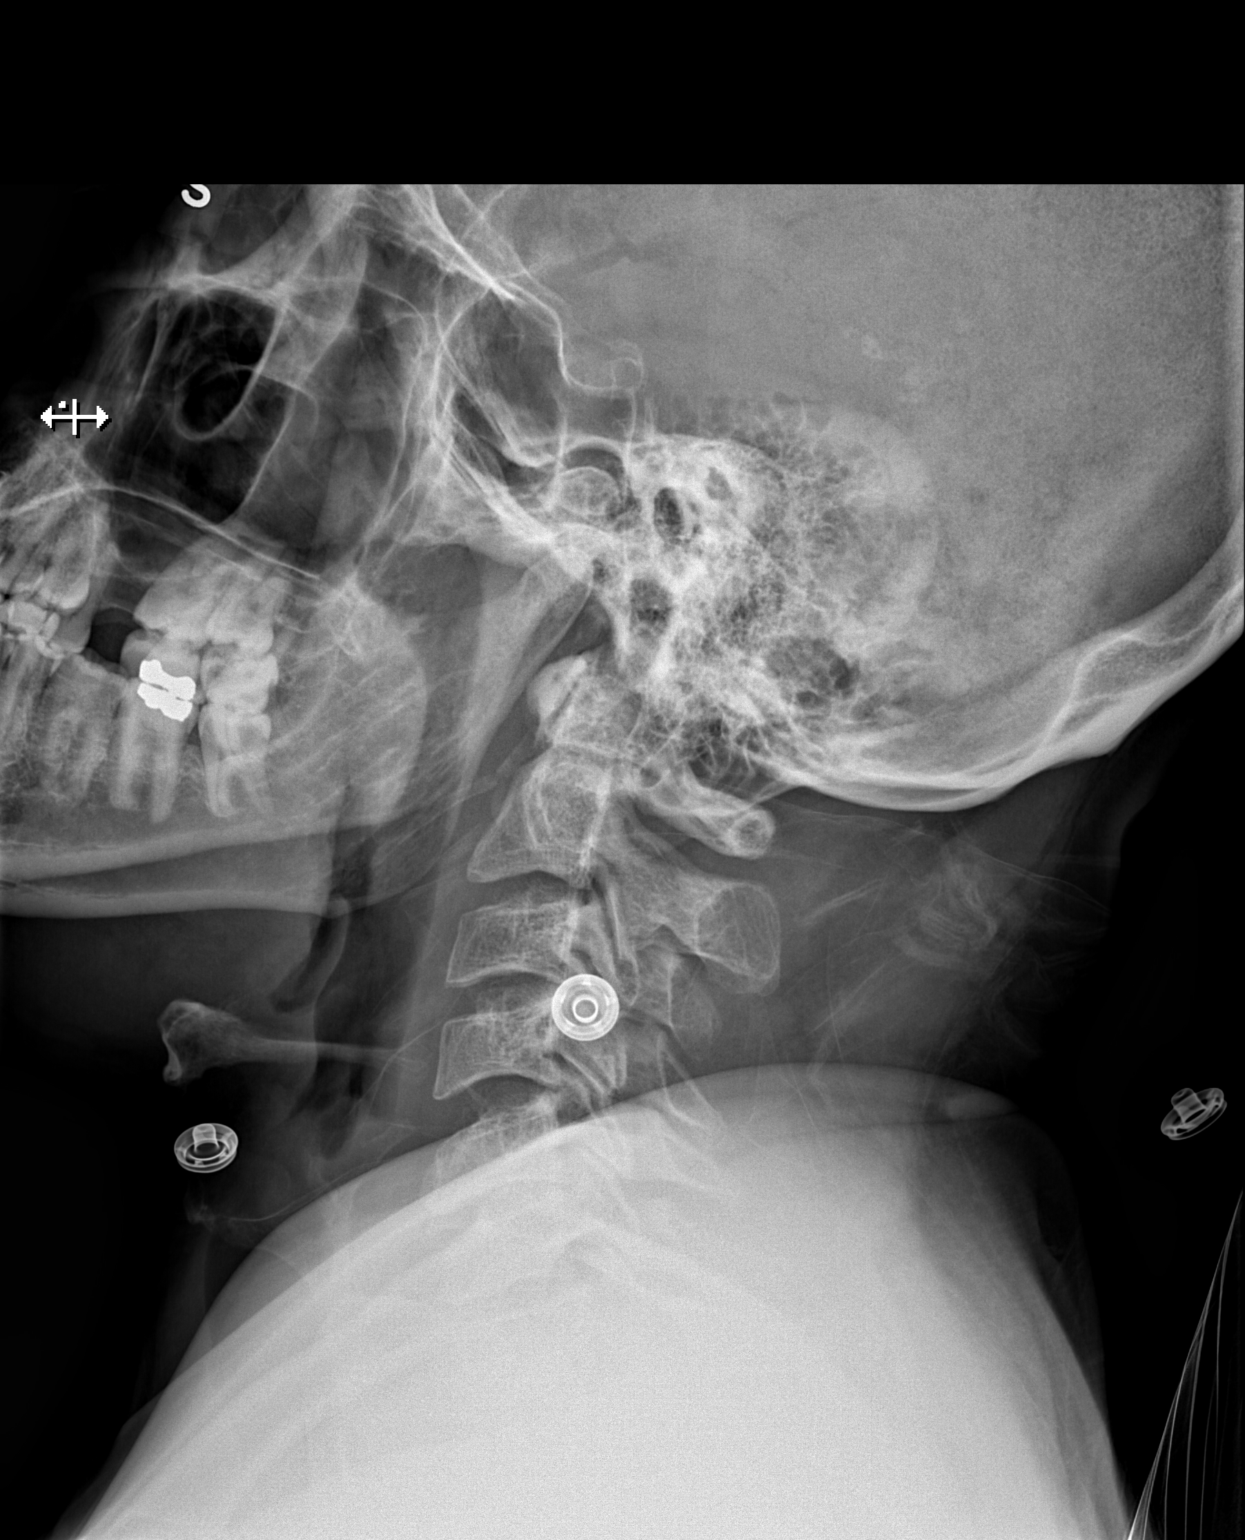

[w soft tissue neck lat (2 of 2)]
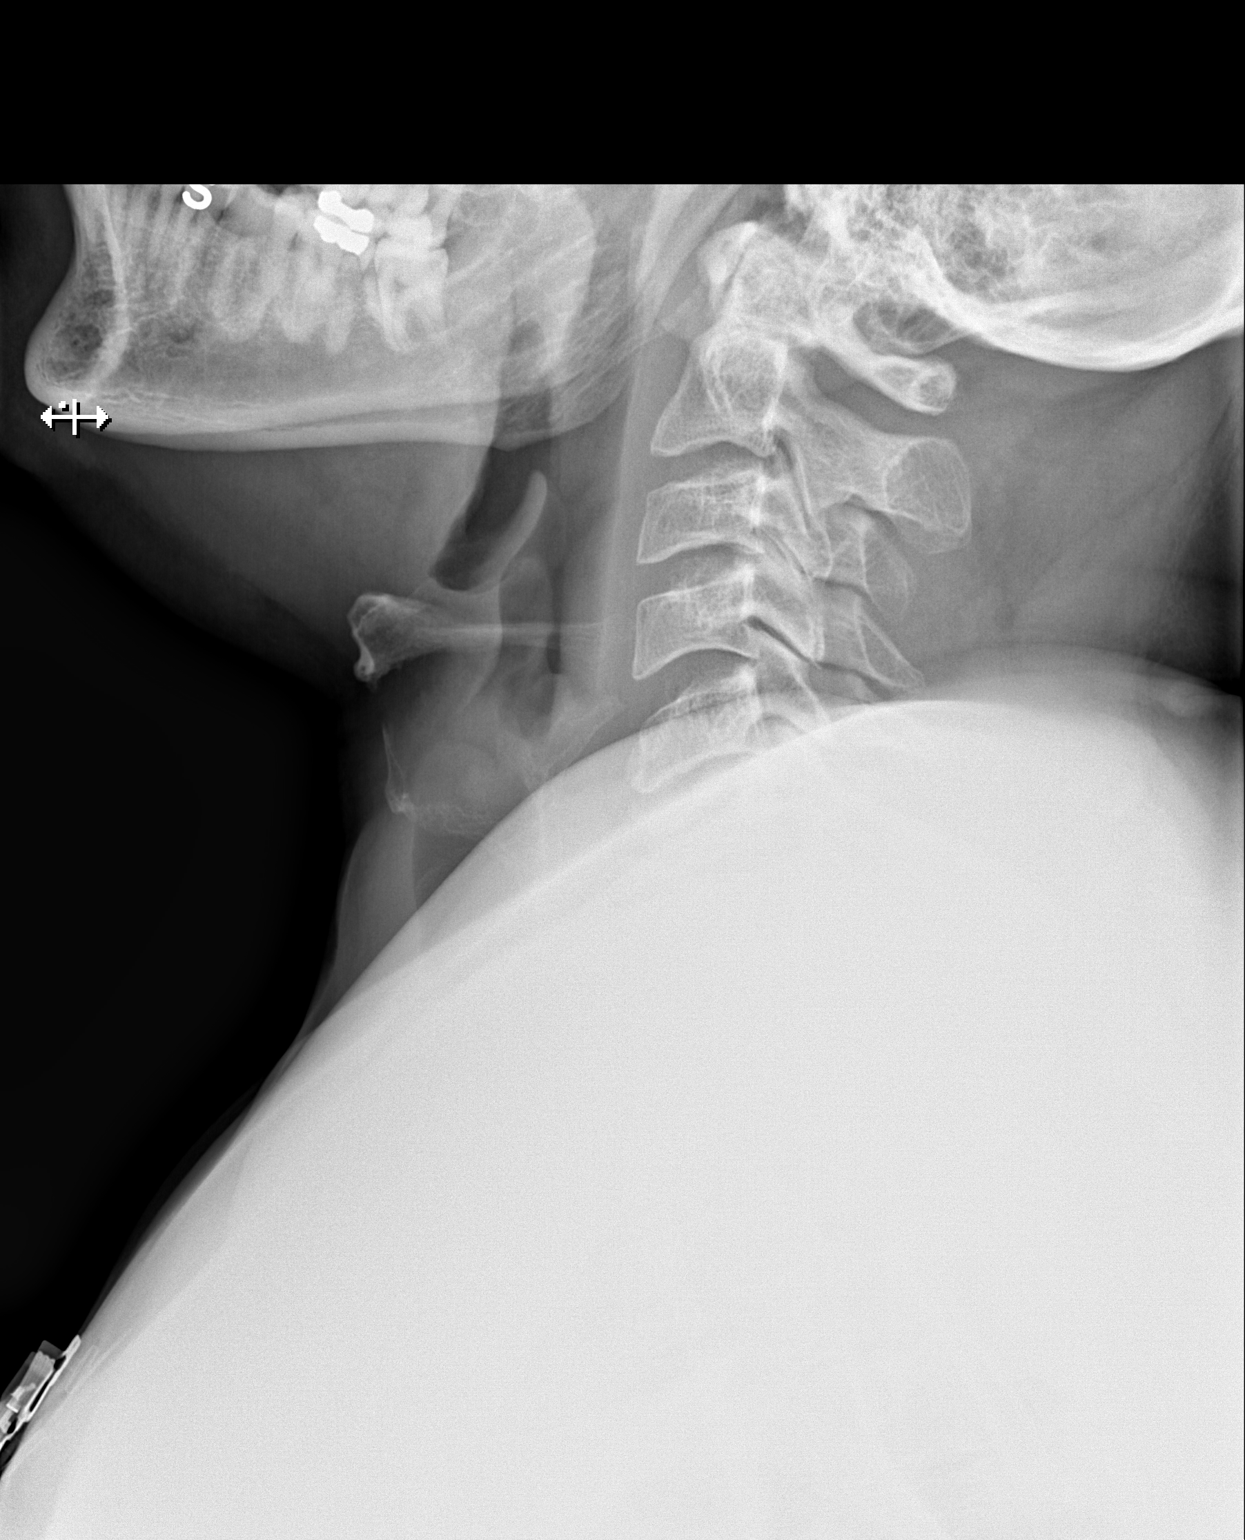

[2 of 2 positions shown; findings below may reference images not displayed]

FINDINGS: The epiglottis is normal.  The aryepiglottic folds are
normal.  There is air in the vallecular air space.  No prevertebral
or retropharyngeal soft tissue swelling or abnormal air
collections. No radiopaque foreign body.
IMPRESSION: Unremarkable soft tissue examination of the neck.

## 2012-06-22 IMAGING — CT CT ANGIO CHEST
1 of 3 series · 18 of 33 positions shown · IV contrast (100 ML OMNI 300)
Comparison: Chest CT 01/15/2009.

CLINICAL DATA: Shortness of breath.

CT ANGIOGRAPHY CHEST
TECHNIQUE: Multidetector CT imaging of the chest using the
standard protocol during bolus administration of intravenous
contrast. Multiplanar reconstructed images including MIPs were
obtained and reviewed to evaluate the vascular anatomy.
Contrast: 100mL OMNIPAQUE IOHEXOL 300 MG/ML IJ SOLN

[Series 9: thins for pacs · axial · 0.65mm/px · z∈[+1438,+1654]mm · 18 of 248 slices shown]
[im 16/248  lung]
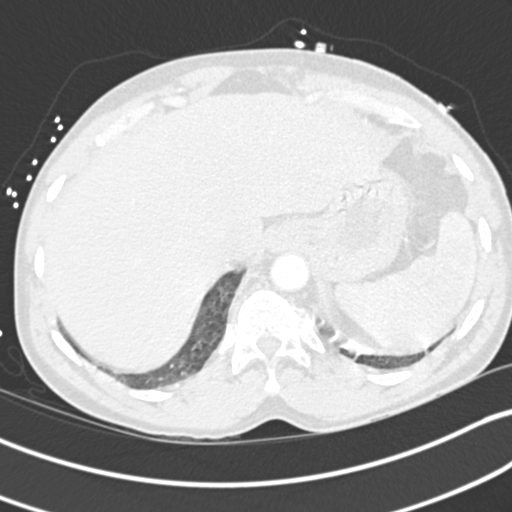
[im 31/248  mediastinal]
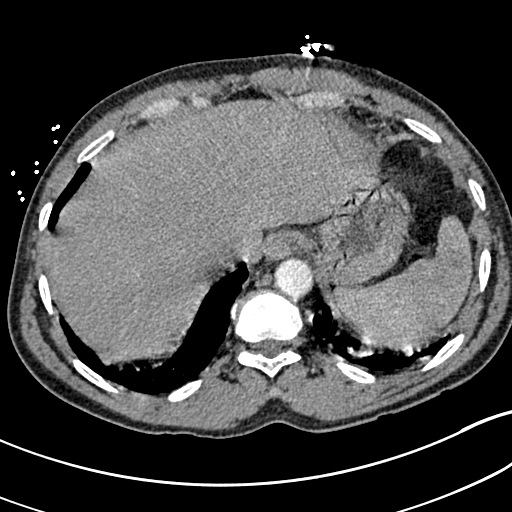
[im 47/248  lung]
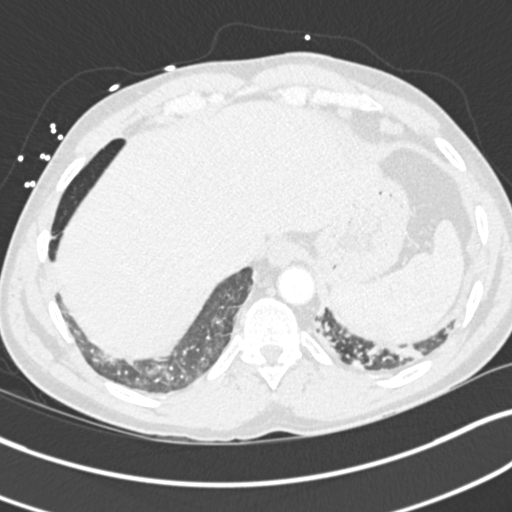
[im 62/248  mediastinal]
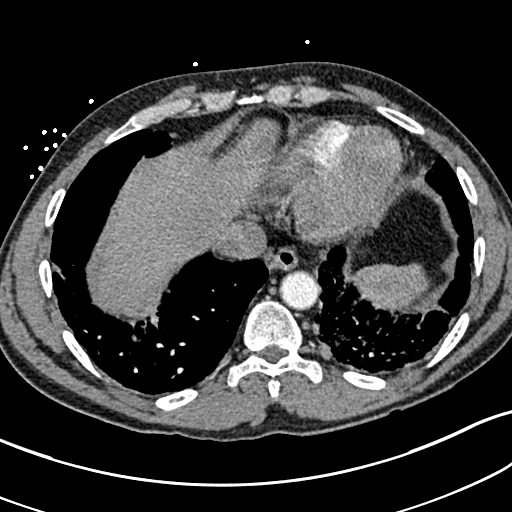
[im 78/248  lung]
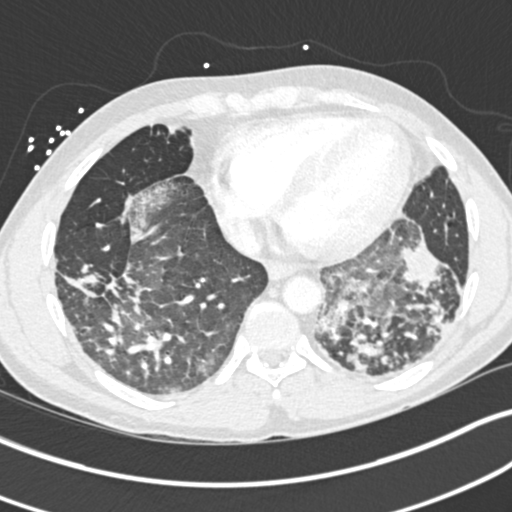
[im 83/248  mediastinal]
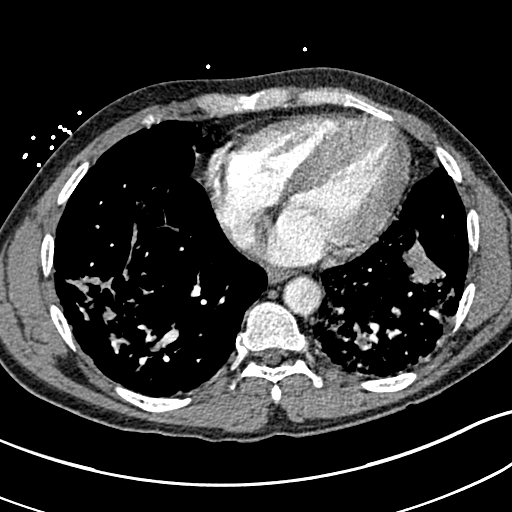
[im 93/248  lung]
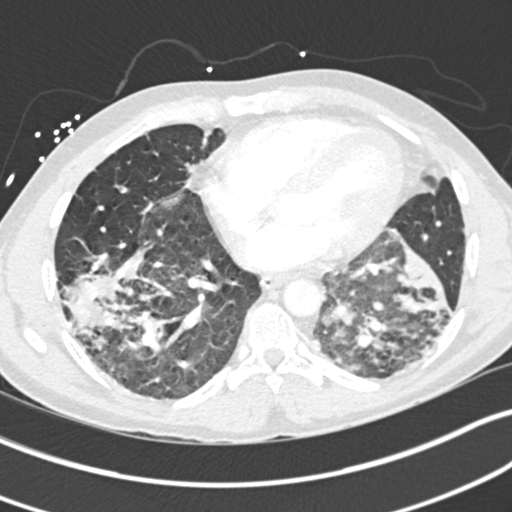
[im 109/248  mediastinal]
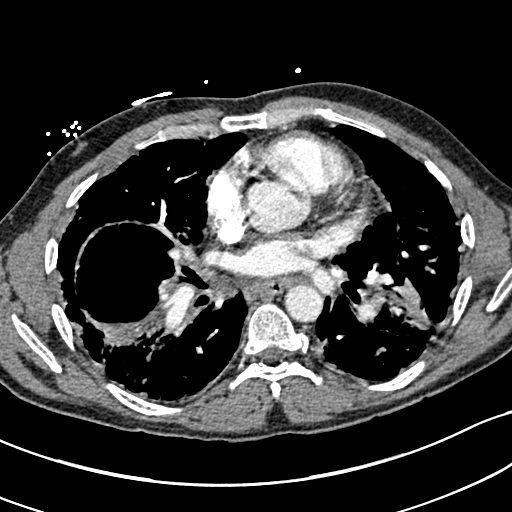
[im 113/248  lung]
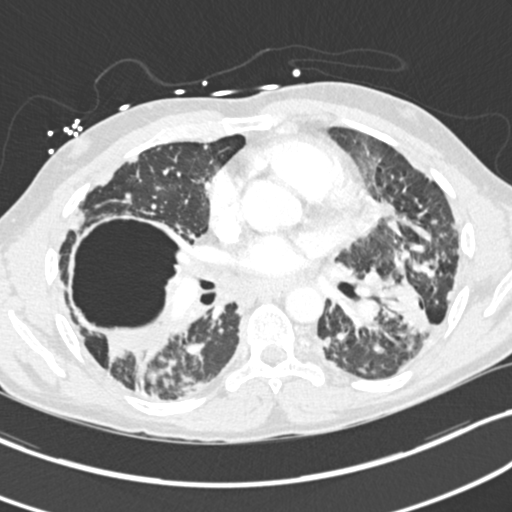
[im 124/248  mediastinal]
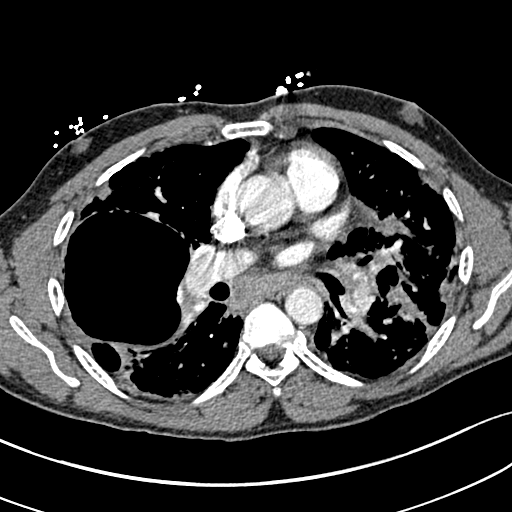
[im 139/248  lung]
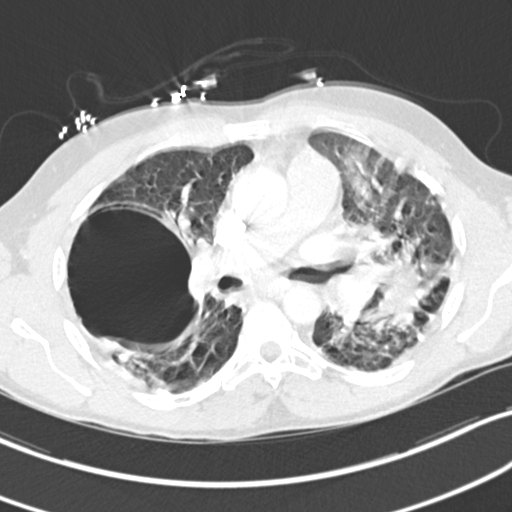
[im 155/248  mediastinal]
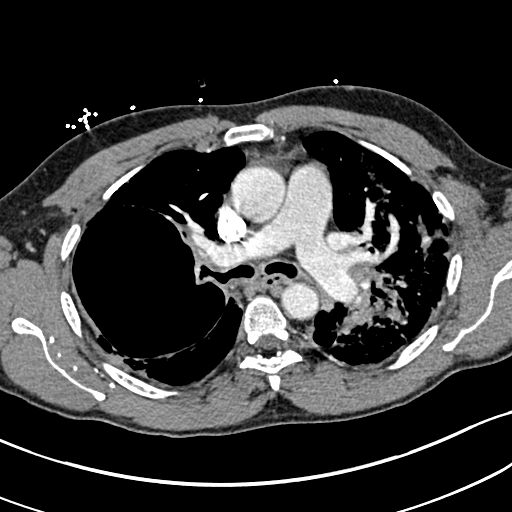
[im 165/248  lung]
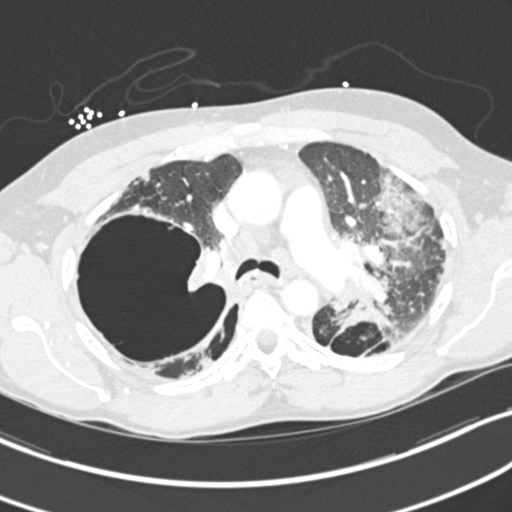
[im 170/248  mediastinal]
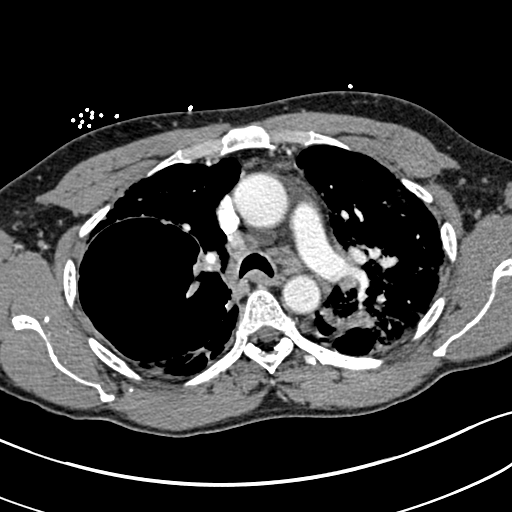
[im 186/248  lung]
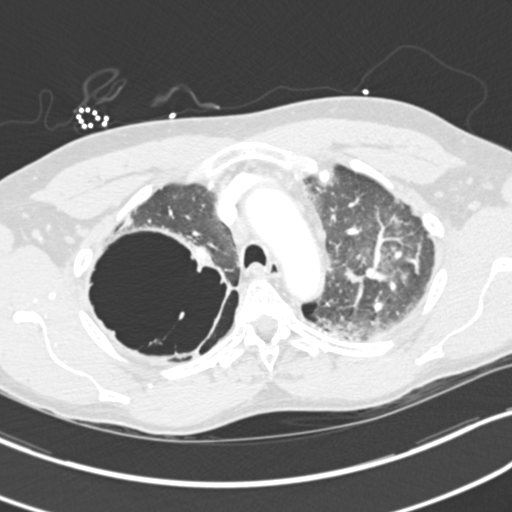
[im 201/248  mediastinal]
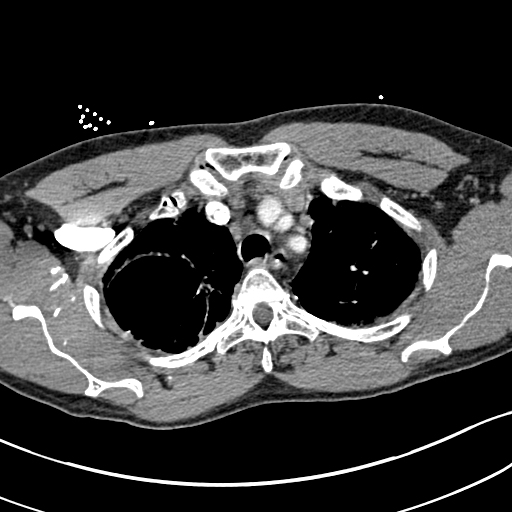
[im 217/248  lung]
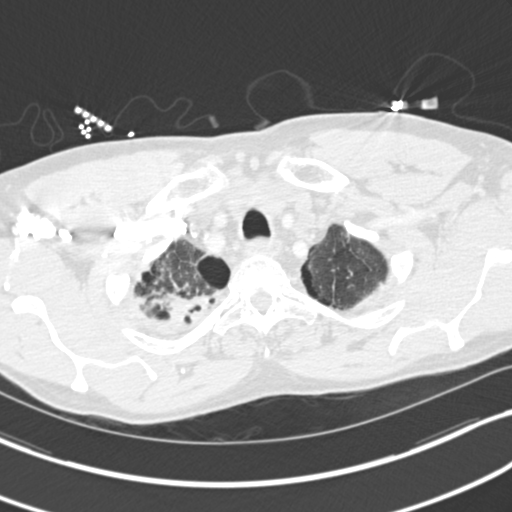
[im 232/248  mediastinal]
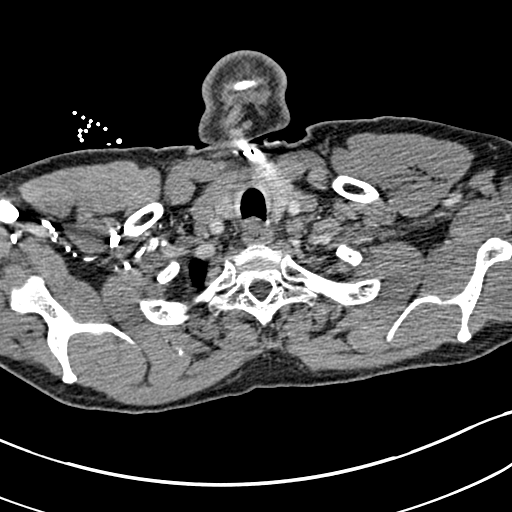

[18 of 33 positions shown; findings below may reference images not displayed]

FINDINGS: The chest wall is unremarkable and stable.  Small
scattered supraclavicular and axillary lymph nodes are noted.
Thyroid gland is grossly normal.  The bony thorax is intact.  No
acute rib fractures.

The heart is normal in size and stable.  No pericardial effusion.
The aorta is normal in caliber.  No dissection.  The esophagus is
grossly normal.  There are enlarged mediastinal and hilar lymph
nodes which are stable and consistent with known sarcoidosis.

The pulmonary arterial tree is fairly well opacified.  No filling
defects to suggest pulmonary emboli.

Examination of the lung parenchyma demonstrates chronic lung
disease related to known sarcoidosis with severe scarring and
diffuse nodularity.  The large cystic cavity in the right lung has
enlarged since prior study.

Ground-glass opacity in the left upper lobe and left lower lobes
could suggest acute overlying infection.  No pleural effusion or
obvious pulmonary edema.

The upper abdomen is grossly normal.
IMPRESSION: 1.  No CT findings for pulmonary embolism.
2.  Normal thoracic aorta.
3.  Stable changes of sarcoidosis with mediastinal/hilar adenopathy
and fibrotic lung disease.
4.  Probable superimposed acute left upper lobe and lower lobe
infiltrates.

## 2012-06-27 ENCOUNTER — Ambulatory Visit (INDEPENDENT_AMBULATORY_CARE_PROVIDER_SITE_OTHER): Payer: 59 | Admitting: Emergency Medicine

## 2012-06-27 ENCOUNTER — Ambulatory Visit (INDEPENDENT_AMBULATORY_CARE_PROVIDER_SITE_OTHER)
Admission: RE | Admit: 2012-06-27 | Discharge: 2012-06-27 | Disposition: A | Discharge status: Home / Self Care | Payer: 59 | Source: Ambulatory Visit | Attending: Emergency Medicine | Admitting: Emergency Medicine

## 2012-06-27 ENCOUNTER — Encounter: Payer: Self-pay | Admitting: Emergency Medicine

## 2012-06-27 VITALS — BP 140/90 | HR 121 | Temp 98.6°F | Ht 67.25 in | Wt 189.4 lb

## 2012-06-27 DIAGNOSIS — D869 Sarcoidosis, unspecified: Secondary | ICD-10-CM

## 2012-06-27 DIAGNOSIS — G4733 Obstructive sleep apnea (adult) (pediatric): Secondary | ICD-10-CM

## 2012-06-27 MED ORDER — AZITHROMYCIN 250 MG PO TABS: 250.0000 mg | ORAL_TABLET | Freq: Every day | ORAL | Status: DC

## 2012-06-27 MED ORDER — PREDNISONE 10 MG PO TABS: ORAL_TABLET | ORAL | Status: DC

## 2012-06-27 NOTE — Progress Notes (Signed)
Subjective:    Patient ID: [name], male    DOB: 02-27-1965, 47 y.o.   MRN: [id number]  HPI 47 yo man, never smoker, hx sarcoidosis dx by transbronchial bx's in 2002 and again in 2011 or 2012, also OSA on CPAP. He has occasional flares that sound asthmatic in nature, gets treated with prednisone. He flares typically a few times a year, but he has also gone for over a year without an exacerbation. He has never been on everyday meds, has been on pred with exacerbations - longest he was on it was 90 days. He has also been on a steroid-sparing agent before (?MTX), but it was stopped. He has a large cavity in R lung, scattered infiltrates. He has been on Advair before, Spiriva before, not currently. Has albuterol available prn, uses almost every day. Last PFT were last year at [facility].   ROV 12/48/13 -- sarcoidosis, R lung cavitary disease. Was recently hospitalized for an acute exacerbation. D/c after pred taper. Last CT scan was 4/13. He is using nebs 2 -3 x a day.   04/06/12  Post Hospital follow up  Patient presents for a post hospital followup. Patient was recently admitted with acute bronchiectasis, exacerbation, complicated by underlying sarcoidosis. Patient has had several recent hospitalizations and emergency room visits.   He was treated with aggressive pulmonary hygiene, antibiotics, and a steroid taper. Since discharge pt reports breathing is doing well overall but still wheezing, SOB, head congestion and prod cough with clear  Mucus .  Patient denies any hemoptysis, orthopnea, PND, or leg swelling. During admission. Patient has had cocaine, positive urine drug screen in his past. He did receive a social work consult. Patient denies any drug use at today's visit.  He was discharged on prednisone to recommend to hold at 20 mg however, patient misunderstood instructions and has stopped all steroids.  ROV 5/48/14 -- sarcoidosis, bronchiectasis and R cavitary disease. Regular f/u visit. Has  been doing well, some intermittent SOB. Taking allegra and benadryl. He is on QVAR daily, off prednisone. Uses SABA rarely. He is reliable w CPAP.   ROV 48/17/14 -- sarcoidosis, bronchiectasis and R cavitary disease. Also OSA, HTN w diastolic dysfxn.  Was admitted for flare obstructive lung disease/sarcoidosis 6/6 - 6/8. He underwent repeat CT scan chest today >> stable bullous and parenchymal changes compared with 02/2012. He feels back to baseline clinically. Having some cough, clear phlegm. On flonase, allegra and benadryl.    Objective:   Physical Exam  Filed Vitals:   06/27/12 1451  BP: 140/90  Pulse: 121  Temp: 98.6 F (37 C)  TempSrc: Oral  Height: 5' 7.25" (1.708 m)  Weight: 189 lb 6.4 oz (85.911 kg)  SpO2: 95%    Gen: Pleasant, well-nourished, in no distress,  normal affect  ENT: No lesions,  mouth clear,  oropharynx clear, no postnasal drip  Neck: No JVD, no TMG, no carotid bruits  Lungs: No use of accessory muscles, distant, no wheezes or crackles  Cardiovascular: RRR, heart sounds normal, no murmur or gallops, no peripheral edema  Musculoskeletal: No deformities, no cyanosis or clubbing  Neuro: alert, non focal  Skin: Warm, no lesions or rashes   CT chest 06/27/12 --  Comparison: CTA chest dated 02/14/2012  Findings: 10.1 x 7.8 cm thin-walled cavitary/bullous lesion in the  posterior right upper lobe (series 4/image 20), unchanged.  Confluent perihilar opacities with traction bronchiectasis and  architectural distortion, related to known sarcoidosis. Associated  peribronchovascular nodularity bilaterally.  On inspiratory /  expiratory imaging, there is no evidence of air  trapping.  No superimposed opacities suspicious for pneumonia. No pleural  effusion or pneumothorax.  Heart is normal in size. No pericardial effusion.  Partially calcified mediastinal lymphadenopathy, including:  --1.3 cm short-axis right paratracheal node (series 2/image 17)  --1.0 cm  short-axis AP window node (series 2/image 18)  --1.9 cm short-axis subcarinal node (series 2/image 27)  Associated hilar lymphadenopathy is suspected but difficult to  discretely measure in the absence of intravenous contrast  administration. The overall appearance is unchanged.  Visualized upper abdomen is unremarkable.  Visualized osseous structures are within normal limits.       Assessment & Plan:  OSA on CPAP Continue CPAP  Sarcoid With stable but significant CT scan abnormalities, seeing transplant and about to start the initial evaluation. Off pred.  - continue same BD and O2 - no indication to repeat bx or move to VATS at this time.  - rov 4

## 2012-06-27 NOTE — Assessment & Plan Note (Signed)
With stable but significant CT scan abnormalities, seeing transplant and about to start the initial evaluation. Off pred.  - continue same BD and O2 - no indication to repeat bx or move to VATS at this time.  - rov 4

## 2012-06-27 NOTE — Assessment & Plan Note (Signed)
Continue CPAP.  

## 2012-06-27 NOTE — Patient Instructions (Addendum)
Please continue your inhaled medications  Use your oxygen as you have been doing. We will look into a portable concentrator.  Your CT scan is stable to improved compared with 02/2012.  Follow with Dr Delton Coombes in 4 months or sooner if you have any problems.

## 2012-07-06 DIAGNOSIS — H2510 Age-related nuclear cataract, unspecified eye: Secondary | ICD-10-CM | POA: Insufficient documentation

## 2012-07-10 ENCOUNTER — Observation Stay (HOSPITAL_COMMUNITY): Payer: 59

## 2012-07-10 ENCOUNTER — Inpatient Hospital Stay (HOSPITAL_COMMUNITY)
Admission: EM | Admit: 2012-07-10 | Discharge: 2012-08-04 | DRG: 163 | Disposition: A | Discharge status: Home / Self Care | Payer: 59 | Attending: Internal Medicine | Admitting: Internal Medicine

## 2012-07-10 ENCOUNTER — Encounter (HOSPITAL_COMMUNITY): Payer: Self-pay

## 2012-07-10 ENCOUNTER — Emergency Department (HOSPITAL_COMMUNITY): Payer: 59

## 2012-07-10 DIAGNOSIS — J9600 Acute respiratory failure, unspecified whether with hypoxia or hypercapnia: Secondary | ICD-10-CM

## 2012-07-10 DIAGNOSIS — J471 Bronchiectasis with (acute) exacerbation: Secondary | ICD-10-CM

## 2012-07-10 DIAGNOSIS — R1013 Epigastric pain: Secondary | ICD-10-CM

## 2012-07-10 DIAGNOSIS — J930 Spontaneous tension pneumothorax: Principal | ICD-10-CM | POA: Diagnosis present

## 2012-07-10 DIAGNOSIS — J939 Pneumothorax, unspecified: Secondary | ICD-10-CM

## 2012-07-10 DIAGNOSIS — IMO0002 Reserved for concepts with insufficient information to code with codable children: Secondary | ICD-10-CM

## 2012-07-10 DIAGNOSIS — I498 Other specified cardiac arrhythmias: Secondary | ICD-10-CM | POA: Diagnosis present

## 2012-07-10 DIAGNOSIS — I10 Essential (primary) hypertension: Secondary | ICD-10-CM

## 2012-07-10 DIAGNOSIS — R000 Tachycardia, unspecified: Secondary | ICD-10-CM

## 2012-07-10 DIAGNOSIS — J9383 Other pneumothorax: Secondary | ICD-10-CM

## 2012-07-10 DIAGNOSIS — Z9119 Patient's noncompliance with other medical treatment and regimen: Secondary | ICD-10-CM

## 2012-07-10 DIAGNOSIS — E119 Type 2 diabetes mellitus without complications: Secondary | ICD-10-CM | POA: Diagnosis present

## 2012-07-10 DIAGNOSIS — J9620 Acute and chronic respiratory failure, unspecified whether with hypoxia or hypercapnia: Secondary | ICD-10-CM

## 2012-07-10 DIAGNOSIS — I4729 Other ventricular tachycardia: Secondary | ICD-10-CM | POA: Diagnosis present

## 2012-07-10 DIAGNOSIS — D869 Sarcoidosis, unspecified: Secondary | ICD-10-CM

## 2012-07-10 DIAGNOSIS — N179 Acute kidney failure, unspecified: Secondary | ICD-10-CM | POA: Diagnosis present

## 2012-07-10 DIAGNOSIS — I4891 Unspecified atrial fibrillation: Secondary | ICD-10-CM | POA: Diagnosis present

## 2012-07-10 DIAGNOSIS — J99 Respiratory disorders in diseases classified elsewhere: Secondary | ICD-10-CM | POA: Diagnosis present

## 2012-07-10 DIAGNOSIS — F411 Generalized anxiety disorder: Secondary | ICD-10-CM | POA: Diagnosis present

## 2012-07-10 DIAGNOSIS — T797XXA Traumatic subcutaneous emphysema, initial encounter: Secondary | ICD-10-CM

## 2012-07-10 DIAGNOSIS — J40 Bronchitis, not specified as acute or chronic: Secondary | ICD-10-CM

## 2012-07-10 DIAGNOSIS — K5900 Constipation, unspecified: Secondary | ICD-10-CM | POA: Diagnosis not present

## 2012-07-10 DIAGNOSIS — I472 Ventricular tachycardia, unspecified: Secondary | ICD-10-CM | POA: Diagnosis present

## 2012-07-10 DIAGNOSIS — Z9989 Dependence on other enabling machines and devices: Secondary | ICD-10-CM

## 2012-07-10 DIAGNOSIS — T50905A Adverse effect of unspecified drugs, medicaments and biological substances, initial encounter: Secondary | ICD-10-CM

## 2012-07-10 DIAGNOSIS — J9690 Respiratory failure, unspecified, unspecified whether with hypoxia or hypercapnia: Secondary | ICD-10-CM

## 2012-07-10 DIAGNOSIS — Z91199 Patient's noncompliance with other medical treatment and regimen due to unspecified reason: Secondary | ICD-10-CM

## 2012-07-10 DIAGNOSIS — T797XXD Traumatic subcutaneous emphysema, subsequent encounter: Secondary | ICD-10-CM

## 2012-07-10 DIAGNOSIS — K219 Gastro-esophageal reflux disease without esophagitis: Secondary | ICD-10-CM

## 2012-07-10 DIAGNOSIS — E875 Hyperkalemia: Secondary | ICD-10-CM | POA: Diagnosis present

## 2012-07-10 DIAGNOSIS — J45909 Unspecified asthma, uncomplicated: Secondary | ICD-10-CM | POA: Diagnosis present

## 2012-07-10 DIAGNOSIS — J189 Pneumonia, unspecified organism: Secondary | ICD-10-CM

## 2012-07-10 DIAGNOSIS — D649 Anemia, unspecified: Secondary | ICD-10-CM

## 2012-07-10 DIAGNOSIS — J209 Acute bronchitis, unspecified: Secondary | ICD-10-CM

## 2012-07-10 DIAGNOSIS — J441 Chronic obstructive pulmonary disease with (acute) exacerbation: Secondary | ICD-10-CM

## 2012-07-10 DIAGNOSIS — R739 Hyperglycemia, unspecified: Secondary | ICD-10-CM

## 2012-07-10 DIAGNOSIS — G4733 Obstructive sleep apnea (adult) (pediatric): Secondary | ICD-10-CM

## 2012-07-10 LAB — CBC WITH DIFFERENTIAL/PLATELET
Hemoglobin: 12.1 g/dL — ABNORMAL LOW (ref 13.0–17.0)
Lymphocytes Relative: 10 % — ABNORMAL LOW (ref 12–46)
Lymphs Abs: 1.2 10*3/uL (ref 0.7–4.0)
Monocytes Relative: 3 % (ref 3–12)
Neutro Abs: 10.3 10*3/uL — ABNORMAL HIGH (ref 1.7–7.7)
Neutrophils Relative %: 86 % — ABNORMAL HIGH (ref 43–77)
Platelets: 249 10*3/uL (ref 150–400)
RBC: 4.61 MIL/uL (ref 4.22–5.81)
WBC: 12 10*3/uL — ABNORMAL HIGH (ref 4.0–10.5)

## 2012-07-10 LAB — COMPREHENSIVE METABOLIC PANEL
ALT: 21 U/L (ref 0–53)
Alkaline Phosphatase: 103 U/L (ref 39–117)
BUN: 13 mg/dL (ref 6–23)
Chloride: 104 mEq/L (ref 96–112)
GFR calc Af Amer: >90 mL/min (ref >90)
Glucose, Bld: 197 mg/dL — ABNORMAL HIGH (ref 70–99)
Potassium: 5 mEq/L (ref 3.5–5.1)
Sodium: 140 mEq/L (ref 135–145)
Total Bilirubin: 0.3 mg/dL (ref 0.3–1.2)
Total Protein: 7.2 g/dL (ref 6.0–8.3)

## 2012-07-10 LAB — URINALYSIS, ROUTINE W REFLEX MICROSCOPIC
Bilirubin Urine: NEGATIVE
Glucose, UA: 100 mg/dL — AB
Leukocytes, UA: NEGATIVE
Nitrite: NEGATIVE
Specific Gravity, Urine: 1.022 (ref 1.005–1.030)
pH: 5 (ref 5.0–8.0)

## 2012-07-10 LAB — BLOOD GAS, ARTERIAL
Acid-base deficit: 1.9 mmol/L (ref 0.0–2.0)
Drawn by: 257701
FIO2: 1 %
MECHVT: 550 mL
O2 Saturation: 99.6 %
PEEP: 5 cmH2O
RATE: 20 resp/min
pCO2 arterial: 37.8 mmHg (ref 35.0–45.0)
pO2, Arterial: 459 mmHg — ABNORMAL HIGH (ref 80.0–100.0)

## 2012-07-10 LAB — GLUCOSE, CAPILLARY: Glucose-Capillary: 174 mg/dL — ABNORMAL HIGH (ref 70–99)

## 2012-07-10 LAB — TROPONIN I: Troponin I: <0.3 ng/mL (ref <0.30)

## 2012-07-10 MED ORDER — VITAMIN D3 25 MCG (1000 UNIT) PO TABS
1000.0000 [IU] | ORAL_TABLET | Freq: Every morning | ORAL | Status: DC
  Administered 2012-07-11 – 2012-08-04 (×24): 1000 [IU] via ORAL
  Filled 2012-07-10 (×26): qty 1

## 2012-07-10 MED ORDER — FENTANYL CITRATE 0.05 MG/ML IJ SOLN
INTRAMUSCULAR | Status: AC
  Administered 2012-07-10: 100 ug
  Filled 2012-07-10: qty 2

## 2012-07-10 MED ORDER — AMLODIPINE BESYLATE 10 MG PO TABS
10.0000 mg | ORAL_TABLET | Freq: Every day | ORAL | Status: DC
  Administered 2012-07-10 – 2012-07-13 (×4): 10 mg via ORAL
  Filled 2012-07-10 (×4): qty 1

## 2012-07-10 MED ORDER — DEXTROSE 5 % IV SOLN
2.0000 g | Freq: Three times a day (TID) | INTRAVENOUS | Status: DC
  Administered 2012-07-11 – 2012-07-12 (×4): 2 g via INTRAVENOUS
  Filled 2012-07-10 (×5): qty 2

## 2012-07-10 MED ORDER — INSULIN ASPART 100 UNIT/ML ~~LOC~~ SOLN
0.0000 [IU] | SUBCUTANEOUS | Status: DC
  Administered 2012-07-10 (×2): 2 [IU] via SUBCUTANEOUS
  Administered 2012-07-11 (×3): 1 [IU] via SUBCUTANEOUS
  Administered 2012-07-11: 12:00:00 via SUBCUTANEOUS
  Administered 2012-07-12 – 2012-07-15 (×7): 1 [IU] via SUBCUTANEOUS

## 2012-07-10 MED ORDER — SODIUM CHLORIDE 0.9 % IV SOLN
250.0000 mL | INTRAVENOUS | Status: DC | PRN
  Administered 2012-07-16: 1000 mL via INTRAVENOUS
  Administered 2012-07-29: 250 mL via INTRAVENOUS

## 2012-07-10 MED ORDER — SODIUM CHLORIDE 0.9 % IJ SOLN
10.0000 mL | INTRAMUSCULAR | Status: DC | PRN
  Administered 2012-07-18 – 2012-07-19 (×2): 20 mL
  Administered 2012-07-20: 10 mL
  Filled 2012-07-10 (×2): qty 10
  Filled 2012-07-10: qty 20
  Filled 2012-07-10: qty 10
  Filled 2012-07-10: qty 20
  Filled 2012-07-10 (×2): qty 10

## 2012-07-10 MED ORDER — FAMOTIDINE 20 MG PO TABS
20.0000 mg | ORAL_TABLET | Freq: Two times a day (BID) | ORAL | Status: DC
  Administered 2012-07-10 – 2012-07-22 (×24): 20 mg via ORAL
  Filled 2012-07-10 (×32): qty 1

## 2012-07-10 MED ORDER — PROPOFOL 10 MG/ML IV EMUL
5.0000 ug/kg/min | INTRAVENOUS | Status: DC
  Administered 2012-07-10: 30 ug/kg/min via INTRAVENOUS
  Administered 2012-07-11: 40 ug/kg/min via INTRAVENOUS
  Administered 2012-07-11: 30 ug/kg/min via INTRAVENOUS
  Filled 2012-07-10 (×2): qty 100

## 2012-07-10 MED ORDER — CEFTAZIDIME 1 G IJ SOLR
1.0000 g | Freq: Once | INTRAMUSCULAR | Status: DC
  Filled 2012-07-10: qty 1

## 2012-07-10 MED ORDER — MAGNESIUM SULFATE 40 MG/ML IJ SOLN
2.0000 g | INTRAMUSCULAR | Status: AC
  Administered 2012-07-10: 2 g via INTRAVENOUS
  Filled 2012-07-10: qty 50

## 2012-07-10 MED ORDER — SODIUM CHLORIDE 0.9 % IV SOLN
INTRAVENOUS | Status: DC
  Administered 2012-07-10 – 2012-07-13 (×2): via INTRAVENOUS

## 2012-07-10 MED ORDER — METHYLPREDNISOLONE SODIUM SUCC 40 MG IJ SOLR
40.0000 mg | Freq: Two times a day (BID) | INTRAMUSCULAR | Status: DC
  Administered 2012-07-10 – 2012-07-11 (×2): 40 mg via INTRAVENOUS
  Filled 2012-07-10 (×4): qty 1

## 2012-07-10 MED ORDER — HEPARIN SODIUM (PORCINE) 5000 UNIT/ML IJ SOLN
5000.0000 [IU] | Freq: Three times a day (TID) | INTRAMUSCULAR | Status: DC
  Administered 2012-07-10 – 2012-07-15 (×14): 5000 [IU] via SUBCUTANEOUS
  Filled 2012-07-10 (×17): qty 1

## 2012-07-10 MED ORDER — BACITRACIN-NEOMYCIN-POLYMYXIN 400-5-5000 EX OINT
TOPICAL_OINTMENT | CUTANEOUS | Status: AC
  Filled 2012-07-10: qty 2

## 2012-07-10 MED ORDER — PROPOFOL 10 MG/ML IV EMUL
5.0000 ug/kg/min | INTRAVENOUS | Status: DC
  Filled 2012-07-10: qty 100

## 2012-07-10 MED ORDER — VANCOMYCIN HCL IN DEXTROSE 1-5 GM/200ML-% IV SOLN
1000.0000 mg | Freq: Three times a day (TID) | INTRAVENOUS | Status: DC
  Administered 2012-07-11: 1000 mg via INTRAVENOUS
  Filled 2012-07-10 (×3): qty 200

## 2012-07-10 MED ORDER — ALBUTEROL SULFATE HFA 108 (90 BASE) MCG/ACT IN AERS
2.0000 | INHALATION_SPRAY | Freq: Four times a day (QID) | RESPIRATORY_TRACT | Status: DC
  Administered 2012-07-11 (×4): 2 via RESPIRATORY_TRACT
  Filled 2012-07-10: qty 6.7

## 2012-07-10 MED ORDER — DOCUSATE SODIUM 100 MG PO CAPS
100.0000 mg | ORAL_CAPSULE | Freq: Two times a day (BID) | ORAL | Status: DC
  Administered 2012-07-11 – 2012-07-12 (×3): 100 mg via ORAL
  Filled 2012-07-10 (×7): qty 1

## 2012-07-10 MED ORDER — TRAZODONE HCL 100 MG PO TABS
100.0000 mg | ORAL_TABLET | Freq: Every day | ORAL | Status: DC
  Administered 2012-07-10 – 2012-07-14 (×4): 100 mg via ORAL
  Filled 2012-07-10 (×6): qty 1

## 2012-07-10 MED ORDER — MIDAZOLAM HCL 2 MG/2ML IJ SOLN: 2.0000 mg | INTRAMUSCULAR | Status: DC | PRN

## 2012-07-10 MED ORDER — SODIUM CHLORIDE 0.9 % IV SOLN
25.0000 ug/h | INTRAVENOUS | Status: DC
  Administered 2012-07-10: 25 ug/h via INTRAVENOUS
  Filled 2012-07-10 (×2): qty 50

## 2012-07-10 MED ORDER — FENTANYL CITRATE 0.05 MG/ML IJ SOLN
INTRAMUSCULAR | Status: AC
  Filled 2012-07-10: qty 2

## 2012-07-10 MED ORDER — PROPOFOL 10 MG/ML IV EMUL
INTRAVENOUS | Status: AC
  Administered 2012-07-10: 1000 mg
  Filled 2012-07-10: qty 100

## 2012-07-10 MED ORDER — DEXTROSE 5 % IV SOLN
1.0000 g | Freq: Once | INTRAVENOUS | Status: AC
  Administered 2012-07-10: 1 g via INTRAVENOUS
  Filled 2012-07-10: qty 1

## 2012-07-10 MED ORDER — AMITRIPTYLINE HCL 25 MG PO TABS
25.0000 mg | ORAL_TABLET | Freq: Every day | ORAL | Status: DC
  Administered 2012-07-10 – 2012-07-12 (×2): 25 mg via ORAL
  Filled 2012-07-10 (×3): qty 1

## 2012-07-10 MED ORDER — MAGNESIUM SULFATE 50 % IJ SOLN: 2.0000 g | Freq: Once | INTRAMUSCULAR | Status: DC

## 2012-07-10 MED ORDER — FENTANYL BOLUS VIA INFUSION
25.0000 ug | Freq: Four times a day (QID) | INTRAVENOUS | Status: DC | PRN
  Filled 2012-07-10: qty 100

## 2012-07-10 MED ORDER — ETOMIDATE 2 MG/ML IV SOLN
INTRAVENOUS | Status: AC | PRN
  Administered 2012-07-10: 20 mg via INTRAVENOUS

## 2012-07-10 MED ORDER — SUCCINYLCHOLINE CHLORIDE 20 MG/ML IJ SOLN
INTRAMUSCULAR | Status: AC | PRN
  Administered 2012-07-10: 100 mg via INTRAVENOUS

## 2012-07-10 MED ORDER — FLUTICASONE PROPIONATE 50 MCG/ACT NA SUSP
2.0000 | Freq: Every day | NASAL | Status: DC
  Administered 2012-07-10 – 2012-07-23 (×15): 2 via NASAL
  Filled 2012-07-10 (×3): qty 16

## 2012-07-10 MED ORDER — PANTOPRAZOLE SODIUM 40 MG IV SOLR
40.0000 mg | Freq: Every day | INTRAVENOUS | Status: DC
Start: 2012-07-10 — End: 2012-07-10

## 2012-07-10 MED ORDER — ALBUTEROL (5 MG/ML) CONTINUOUS INHALATION SOLN
10.0000 mg/h | INHALATION_SOLUTION | RESPIRATORY_TRACT | Status: DC
  Administered 2012-07-10: 10 mg/h via RESPIRATORY_TRACT
  Filled 2012-07-10: qty 20

## 2012-07-10 MED ORDER — VANCOMYCIN HCL IN DEXTROSE 1-5 GM/200ML-% IV SOLN
1000.0000 mg | Freq: Once | INTRAVENOUS | Status: AC
  Administered 2012-07-10: 1000 mg via INTRAVENOUS
  Filled 2012-07-10: qty 200

## 2012-07-10 NOTE — ED Provider Notes (Signed)
History    CSN: [id number] Arrival date & time 07/10/12  1346  First MD Initiated Contact with Patient 07/10/12 1350     Chief Complaint  Patient presents with  . Respiratory Distress   (Consider location/radiation/quality/duration/timing/severity/associated sxs/prior Treatment) HPI  Level 5 caveat due to respiratory distress. EMS called and found pt tachypneic with sats in the 50's. Started on CPAP and given 5 mg albuterol neb, 125 mg solumedrol. Sats in low 90's. Pt unable to give history.  Past Medical History  Diagnosis Date  . Hypertension   . Sarcoidosis   . OSA on CPAP   . Chronic respiratory failure     home oxygen PRN  . Bronchitis   . Pneumonia    Past Surgical History  Procedure Laterality Date  . Rotator cuff repair    . Arm surgery      Family History  Problem Relation Age of Onset  . Diabetes Father    History  Substance Use Topics  . Smoking status: Never Smoker   . Smokeless tobacco: Never Used  . Alcohol Use: Yes     Comment: social    Review of Systems  Unable to perform ROS Respiratory: Positive for shortness of breath and wheezing.     Allergies  Clindamycin/lincomycin  Home Medications   Current Outpatient Rx  Name  Route  Sig  Dispense  Refill  . EXPIRED: albuterol (PROVENTIL HFA;VENTOLIN HFA) 108 (90 BASE) MCG/ACT inhaler   Inhalation   Inhale 2 puffs into the lungs every 6 (six) hours as needed. Wheezing         . albuterol (PROVENTIL) (2.5 MG/3ML) 0.083% nebulizer solution      2.5 mg. 1 vial in nebulizer every 6 hours as needed         . amitriptyline (ELAVIL) 25 MG tablet   Oral   Take 25 mg by mouth at bedtime. For sleep         . amLODipine (NORVASC) 10 MG tablet   Oral   Take 1 tablet (10 mg total) by mouth daily.   30 tablet   0   . azithromycin (ZITHROMAX) 250 MG tablet   Oral   Take 1 tablet (250 mg total) by mouth daily.   6 tablet   0     Take 2 on the first day and then one daily until g ...    . beclomethasone (QVAR) 80 MCG/ACT inhaler   Inhalation   Inhale 2 puffs into the lungs 2 (two) times daily.         . calcium-vitamin D (OSCAL WITH D) 500-200 MG-UNIT per tablet   Oral   Take 1 tablet by mouth every morning.         . chlorthalidone (HYGROTON) 25 MG tablet   Oral   Take 25 mg by mouth every morning.          . cholecalciferol (VITAMIN D) 1000 UNITS tablet   Oral   Take 1,000 Units by mouth every morning.          . cyclobenzaprine (FLEXERIL) 10 MG tablet   Oral   Take 10 mg by mouth 3 (three) times daily as needed. Muscle spasms.         . diphenhydrAMINE (BENADRYL) 25 MG tablet   Oral   Take 25 mg by mouth every 6 (six) hours as needed. Allergies         . docusate sodium (COLACE) 100 MG capsule   Oral  Take 1 capsule (100 mg total) by mouth every 12 (twelve) hours.   60 capsule   0   . famotidine (PEPCID) 20 MG tablet   Oral   Take 1 tablet (20 mg total) by mouth 2 (two) times daily.   60 tablet   1   . ferrous sulfate 325 (65 FE) MG tablet   Oral   Take 1 tablet (325 mg total) by mouth 2 (two) times daily with a meal.   60 tablet   0   . fluticasone (FLONASE) 50 MCG/ACT nasal spray   Nasal   Place 2 sprays into the nose daily.   16 g   1   . guaiFENesin (MUCINEX) 600 MG 12 hr tablet   Oral   Take 1,200 mg by mouth 2 (two) times daily. For congestion.         . lidocaine (LIDODERM) 5 %   Transdermal   Place 1 patch onto the skin daily. Remove & Discard patch within 12 hours or as directed by MD         . metoprolol tartrate (LOPRESSOR) 25 MG tablet      25 mg. Take 1 tablet (25 mg total) by mouth 2 times daily.         . Multiple Vitamins-Minerals (MULTIVITAMIN WITH MINERALS) tablet   Oral   Take 1 tablet by mouth every morning.          Marland Kitchen omeprazole (PRILOSEC) 40 MG capsule      40 mg. Take 1 capsule (40 mg total) by mouth 2 times daily.         . predniSONE (DELTASONE) 10 MG tablet      Take 40mg   daily for 3 days, then 30mg  daily for 3 days, then 20mg  daily for 3 days, then 10mg  daily for 3 days, then stop   30 tablet   0   . pregabalin (LYRICA) 200 MG capsule   Oral   Take 200 mg by mouth 2 (two) times daily.         . promethazine (PHENERGAN) 25 MG tablet      25 mg. Take 1 tablet (25 mg total) by mouth every 6 (six) hours as needed for Nausea.         . sodium chloride (OCEAN) 0.65 % SOLN nasal spray   Nasal   Place 2 sprays into the nose 4 (four) times daily.         . traZODone (DESYREL) 100 MG tablet   Oral   Take 100 mg by mouth at bedtime. sleep          BP 209/138  Pulse 140  Resp 20  SpO2 69% Physical Exam  Nursing note and vitals reviewed. Constitutional: He appears well-developed and well-nourished. He appears distressed.  HENT:  Head: Normocephalic and atraumatic.  Mouth/Throat: Oropharynx is clear and moist. No oropharyngeal exudate.  Eyes: EOM are normal. Pupils are equal, round, and reactive to light.  Neck: Normal range of motion. Neck supple.  Cardiovascular: Regular rhythm.   tachycardia  Pulmonary/Chest: No respiratory distress. He has no wheezes. He has no rales. He exhibits no tenderness.  Increased effort, decreased BS throughout with faint end expiratory wheezes.   Abdominal: Soft. Bowel sounds are normal. He exhibits no distension and no mass. There is no tenderness. There is no rebound and no guarding.  Musculoskeletal: Normal range of motion. He exhibits no edema and no tenderness.  Neurological: He is alert.  Moves all  ext  Skin: Skin is warm. No rash noted. He is diaphoretic. No erythema.    ED Course  CHEST TUBE INSERTION Date/Time: 07/10/2012 3:26 PM Performed by: Loren Racer Authorized by: Ranae Palms, Mertha Clyatt Consent: The procedure was performed in an emergent situation. Imaging studies: imaging studies available Indications: pneumothorax and tension pneumothorax Patient sedated: yes Sedatives: etomidate Analgesia:  fentanyl Anesthesia: local infiltration Local anesthetic: lidocaine 1% without epinephrine Anesthetic total: 5 ml Preparation: skin prepped with ChloraPrep Placement location: right lateral Scalpel size: 11 Tube size: 20 [place] Dissection instrument: Kelly clamp Ultrasound guidance: no Tension pneumothorax heard: yes Tube connected to: suction Drainage amount: 0 ml Suture material: 2-0 silk Dressing: 4x4 sterile gauze Post-insertion x-ray findings: tube in good position Patient tolerance: Patient tolerated the procedure well with no immediate complications.   (including critical care time) Labs Reviewed  CBC WITH DIFFERENTIAL - Abnormal; Notable for the following:    WBC 12.0 (*)    Hemoglobin 12.1 (*)    HCT 37.8 (*)    Neutrophils Relative % 86 (*)    Neutro Abs 10.3 (*)    Lymphocytes Relative 10 (*)    All other components within normal limits  BLOOD GAS, ARTERIAL - Abnormal; Notable for the following:    pO2, Arterial 459.0 (*)    All other components within normal limits  CULTURE, BLOOD (ROUTINE X 2)  CULTURE, BLOOD (ROUTINE X 2)  CULTURE, RESPIRATORY (NON-EXPECTORATED)  COMPREHENSIVE METABOLIC PANEL  TROPONIN I  PRO B NATRIURETIC PEPTIDE  D-DIMER, QUANTITATIVE  CBC  CREATININE, SERUM  URINALYSIS, ROUTINE W REFLEX MICROSCOPIC  STREP PNEUMONIAE URINARY ANTIGEN   Dg Chest Portable 1 View  07/10/2012   *RADIOLOGY REPORT*  Clinical Data: 47 year old male chest tube placement.  PORTABLE CHEST - 1 VIEW  Comparison: 1358 hours the same day.  Findings: AP portable semi upright view at 1422 hours.  Left chest tube now in place.  Small to moderate volume of left chest wall subcutaneous gas.  Significantly decreased left pneumothorax. Small to moderate volume residual.  Interval expansion of the left lung.  Bilateral perihilar nodular pulmonary opacity.  Endotracheal tube tip stable just below the clavicles.  No mediastinal shift. No definite pleural effusion.  IMPRESSION: 1.   Left chest tube placed with significant reduction of left pneumothorax.  Small to moderate volume residual. 2.  Stable endotracheal tube. 3.  Bilateral perihilar nodular pulmonary opacity.   Original Report Authenticated By: Erskine Speed, M.D.   Dg Chest Port 1 View  07/10/2012   *RADIOLOGY REPORT*  Clinical Data: Shortness of breath.  Respiratory distress.  PORTABLE CHEST - 1 VIEW  Comparison: CT chest 06/27/2012  Findings: The patient is intubated.  The endotracheal tube terminates 8.3 cm above the carina.  Heart size is upper limits of normal.  A new large right to left sided pneumothorax is present. Changes sarcoidosis are again seen bilaterally.  No definite superimposed disease is present on the right.  A small right pleural effusion is suspected.  IMPRESSION:  1.  New large left pneumothorax. 2.  Sarcoidosis. 3.  Question small right pleural effusion. 4.  Status post intubation.  Endotracheal tube is in satisfactory position.  Critical Value/emergent results were called by telephone at the time of interpretation on 07/10/2012 at 02:08 p.m. to Dr. Ranae Palms, who verbally acknowledged these results.   Original Report Authenticated By: Marin Roberts, M.D.   No diagnosis found. INTUBATION Performed by: Ranae Palms, Alta Goding  Required items: required blood products, implants, devices, and special equipment  available Patient identity confirmed: provided demographic data and hospital-assigned identification number Time out: Immediately prior to procedure a "time out" was called to verify the correct patient, procedure, equipment, support staff and site/side marked as required.  Indications: respiratory failure  Intubation method: direct laryngoscopy   Preoxygenation: CPAP  Sedatives:20 mgEtomidate Paralytic: 100mg  Succinylcholine  Tube Size: 7.5 cuffed  Post-procedure assessment: chest rise and ETCO2 monitor Breath sounds:decreased BS L chest compared to R but absent over the  epigastrium Tube secured with: ETT holder Chest x-ray interpreted by radiologist and me.  Chest x-ray findings: endotracheal tube in appropriate position, large R PTX  Patient tolerated the procedure well with no immediate complications.  CRITICAL CARE Performed by: Ranae Palms, Shelle Galdamez Total critical care time: 30 min Critical care time was exclusive of separately billable procedures and treating other patients. Critical care was necessary to treat or prevent imminent or life-threatening deterioration. Critical care was time spent personally by me on the following activities: development of treatment plan with patient and/or surrogate as well as nursing, discussions with consultants, evaluation of patient's response to treatment, examination of patient, obtaining history from patient or surrogate, ordering and performing treatments and interventions, ordering and review of laboratory studies, ordering and review of radiographic studies, pulse oximetry and re-evaluation of patient's condition.   MDM  Discussed with intensivitst. Will see in ED and admit.  Spoke with friends who stated pt was in normal state of health this morning and early afternoon.  Pt BP and HR improved immediately after chest tube insertion.   Loren Racer, MD 07/10/12 1530

## 2012-07-10 NOTE — Progress Notes (Signed)
pcp is Dr. Brien Mates at [facility] and Dr Levy Pupa pulmonologist

## 2012-07-10 NOTE — ED Notes (Signed)
Per EMS- C/o of resp distress and SOB, found supine. Hx of sarcoidosis, O2 55% RA, Cpap 89%. 125mg  Solumedrol, .3 Epi, 1/2g Mag. RR 120-130s.

## 2012-07-10 NOTE — ED Notes (Signed)
Pt brought in by EMS, pt in respiratory distress on CPAP, [name] [place] and Dee, RT at beside.

## 2012-07-10 NOTE — ED Notes (Signed)
Report given to [facility] on 2w, states ready for pt; CC at bedside placing a Central line

## 2012-07-10 NOTE — H&P (Signed)
PULMONARY  / CRITICAL CARE MEDICINE  Name: [name] MRN: [id number] DOB: [birth year]    ADMISSION DATE:  07/10/2012 CONSULTATION DATE:  07/10/12  REFERRING MD :  [name] PRIMARY SERVICE: PCCM  CHIEF COMPLAINT:  Acute resp failure, tension ptx in setting advanced sarcoidosis  BRIEF PATIENT DESCRIPTION: 47 year old sarcoidosis, tension ptx, ett.  SIGNIFICANT EVENTS / STUDIES:  6/17 ct chest>>>10.1 x 7.8 cm thin-walled cavitary/bullous lesion in the posterior right upper lobe (series 4/image 20), unchanged. Confluent perihilar opacities with traction bronchiectasis and architectural distortion, related to known sarcoidosis. Associated peribronchovascular nodularity bilaterally  6/30- ETT, tension PTX, chest tube placed.  LINES / TUBES: 6/30 Ett>>> 6/30 chest tube>>>  CULTURES: 6/30 resp>>> 6/30 BC>>>  ANTIBIOTICS: Ceftazidime 3/30>>> vanc 6/30>>>  HISTORY OF PRESENT ILLNESS:   47 yo man, never smoker, hx sarcoidosis dx by transbronchial bx's in 2002 and again in 2011 or 2012, also OSA on CPAP. He has occasional flares that sound asthmatic in nature, gets treated with prednisone. [facility] acute sob, tachy, required ETT, developed shock, pcxr with tension ptx. [name] placed chest tube, improved clinically. Unable to gather history further at this timer.  Office visit with Dr [name] noted 06/27/12 -- sarcoidosis, bronchiectasis and R cavitary disease. Also OSA, HTN w diastolic dysfxn. Was admitted for flare obstructive lung disease/sarcoidosis 6/6 - 6/8. He underwent repeat CT scan chest today >> stable bullous and parenchymal changes compared with 02/2012. He feels back to baseline clinically. Having some cough, clear phlegm. On flonase, allegra and benadryl.   PAST MEDICAL HISTORY :  Past Medical History  Diagnosis Date  . Hypertension   . Sarcoidosis   . OSA on CPAP   . Chronic respiratory failure     home oxygen PRN  . Bronchitis   . Pneumonia    Past Surgical History   Procedure Laterality Date  . Rotator cuff repair    . Arm surgery      Prior to Admission medications   Medication Sig Start Date End Date Taking? Authorizing Provider  albuterol (PROVENTIL HFA;VENTOLIN HFA) 108 (90 BASE) MCG/ACT inhaler Inhale 2 puffs into the lungs every 6 (six) hours as needed. Wheezing 04/13/11 04/12/12  Henderson Cloud, MD  albuterol (PROVENTIL) (2.5 MG/3ML) 0.083% nebulizer solution 2.5 mg. 1 vial in nebulizer every 6 hours as needed 03/30/12   Historical Provider, MD  amitriptyline (ELAVIL) 25 MG tablet Take 25 mg by mouth at bedtime. For sleep    Historical Provider, MD  amLODipine (NORVASC) 10 MG tablet Take 1 tablet (10 mg total) by mouth daily. 06/18/12   Leroy Sea, MD  azithromycin (ZITHROMAX) 250 MG tablet Take 1 tablet (250 mg total) by mouth daily. 06/27/12   Leslye Peer, MD  beclomethasone (QVAR) 80 MCG/ACT inhaler Inhale 2 puffs into the lungs 2 (two) times daily. 01/06/12   Leslye Peer, MD  calcium-vitamin D (OSCAL WITH D) 500-200 MG-UNIT per tablet Take 1 tablet by mouth every morning.    Historical Provider, MD  chlorthalidone (HYGROTON) 25 MG tablet Take 25 mg by mouth every morning.     Historical Provider, MD  cholecalciferol (VITAMIN D) 1000 UNITS tablet Take 1,000 Units by mouth every morning.     Historical Provider, MD  cyclobenzaprine (FLEXERIL) 10 MG tablet Take 10 mg by mouth 3 (three) times daily as needed. Muscle spasms.    Historical Provider, MD  diphenhydrAMINE (BENADRYL) 25 MG tablet Take 25 mg by mouth every 6 (six) hours  as needed. Allergies    Historical Provider, MD  docusate sodium (COLACE) 100 MG capsule Take 1 capsule (100 mg total) by mouth every 12 (twelve) hours. 02/28/12   Roxy Horseman, PA-C  famotidine (PEPCID) 20 MG tablet Take 1 tablet (20 mg total) by mouth 2 (two) times daily. 03/08/12   Renae Fickle, MD  ferrous sulfate 325 (65 FE) MG tablet Take 1 tablet (325 mg total) by mouth 2 (two) times daily with a  meal. 06/18/12   Leroy Sea, MD  fluticasone (FLONASE) 50 MCG/ACT nasal spray Place 2 sprays into the nose daily. 03/08/12   Renae Fickle, MD  guaiFENesin (MUCINEX) 600 MG 12 hr tablet Take 1,200 mg by mouth 2 (two) times daily. For congestion.    Historical Provider, MD  lidocaine (LIDODERM) 5 % Place 1 patch onto the skin daily. Remove & Discard patch within 12 hours or as directed by MD    Historical Provider, MD  metoprolol tartrate (LOPRESSOR) 25 MG tablet 25 mg. Take 1 tablet (25 mg total) by mouth 2 times daily. 03/30/12 03/30/13  Historical Provider, MD  Multiple Vitamins-Minerals (MULTIVITAMIN WITH MINERALS) tablet Take 1 tablet by mouth every morning.     Historical Provider, MD  omeprazole (PRILOSEC) 40 MG capsule 40 mg. Take 1 capsule (40 mg total) by mouth 2 times daily. 11/02/11   Historical Provider, MD  predniSONE (DELTASONE) 10 MG tablet Take 40mg  daily for 3 days, then 30mg  daily for 3 days, then 20mg  daily for 3 days, then 10mg  daily for 3 days, then stop 06/27/12   Leslye Peer, MD  pregabalin (LYRICA) 200 MG capsule Take 200 mg by mouth 2 (two) times daily.    Historical Provider, MD  promethazine (PHENERGAN) 25 MG tablet 25 mg. Take 1 tablet (25 mg total) by mouth every 6 (six) hours as needed for Nausea. 11/02/11   Historical Provider, MD  sodium chloride (OCEAN) 0.65 % SOLN nasal spray Place 2 sprays into the nose 4 (four) times daily. 03/08/12   Renae Fickle, MD  traZODone (DESYREL) 100 MG tablet Take 100 mg by mouth at bedtime. sleep    Historical Provider, MD   Allergies  Allergen Reactions  . Clindamycin/Lincomycin Swelling    FAMILY HISTORY:  Family History  Problem Relation Age of Onset  . Diabetes Father    SOCIAL HISTORY:  reports that he has never smoked. He has never used smokeless tobacco. He reports that  drinks alcohol. He reports that he does not use illicit drugs.  REVIEW OF SYSTEMS:  Unable, pt sedated , ett  SUBJECTIVE:  Improved after  chest tube placement  VITAL SIGNS: Pulse Rate:  [132-140] 140 (06/30 1437) Resp:  [20-38] 20 (06/30 1351) BP: (142-209)/(112-141) 209/138 mmHg (06/30 1437) SpO2:  [69 %-100 %] 69 % (06/30 1437) FiO2 (%):  [40 %-100 %] 40 % (06/30 1457) HEMODYNAMICS:   VENTILATOR SETTINGS: Vent Mode:  [-] PRVC FiO2 (%):  [40 %-100 %] 40 % Set Rate:  [20 bmp] 20 bmp Vt Set:  [550 mL] 550 mL PEEP:  [5 cmH20] 5 cmH20 Plateau Pressure:  [37 cmH20] 37 cmH20 INTAKE / OUTPUT: Intake/Output   None     PHYSICAL EXAMINATION: General:  More comfortable , ett Neuro:  Awake, nonfocal, follow commands HEENT:  Ett, no jvd Cardiovascular:  s1 s2 RRtachy Lungs:  ronchi rt greater left, slight reduced left Abdomen:  Soft, bs wnl no r,./g Musculoskeletal:  No edema Skin:  No rash  LABS:  Recent  Labs Lab 07/10/12 1427  PHART 7.386  PCO2ART 37.8  PO2ART 459.0*   No results found for this basename: GLUCAP,  in the last 168 hours  CXR: rt mid density, maybe increased from chronic changes, large tension left ptx resolving, remains 10% ptx left or less  ASSESSMENT / PLAN:  PULMONARY A: Tension PTX left, sarcoidosis, known rt bleb, r/o PNA P:   Chest tube to 20 cm suction pcxr in am  Maintain current MV on vent Reduce peep to 3, please see large bullae on rt on last CT chest, risk remains to have ptx on rt as well ABX, see ID BDers, albuterol MDI Steroids for now, was on a tapper  CARDIOVASCULAR A: Shoch, tachy, likely all from tension ptx, known HTN P:  Tele Trop neg, ecg neg st changes volume for now Add home HTN meds  RENAL A:  K 5.0 P:   Chem repeat now and in am  Saline  GASTROINTESTINAL A:  NPO P:   Add home pepcid  HEMATOLOGIC A:  DVt prevention P:  Sub q hep while on vent Cbc in am   INFECTIOUS A:  pred dep, recent taper P:   BC, sputum Empiric ceftaz, vanc Pct algo ( on pred), low clinical suspcion, if neg pct x 3 days would consider dc all abx  ENDOCRINE A:   steroids P:   Add cbg, ssi solumedral  NEUROLOGIC A:  Pain post Chest tube, vent dyschrony P:   Propofol Add fent Add int versed wua Home Amit  TODAY'S SUMMARY: Sarcoidosis, likely spont ptx as cause ett. May need acess. Low dose steroids, abx. Likely to wean in am   I have personally obtained a history, examined the patient, evaluated laboratory and imaging results, formulated the assessment and plan and placed orders. CRITICAL CARE: The patient is critically ill with multiple organ systems failure and requires high complexity decision making for assessment and support, frequent evaluation and titration of therapies, application of advanced monitoring technologies and extensive interpretation of multiple databases. Critical Care Time devoted to patient care services described in this note is 45 minutes.   Mcarthur Rossetti. Tyson Alias, MD, FACP Pgr: [phone] [facility]  Pulmonary and Critical Care Medicine [name] Pager: [phone]  07/10/2012, 3:04 PM

## 2012-07-10 NOTE — ED Notes (Signed)
ZOX:WRUE<AV> Expected date:<BR> Expected time:<BR> Means of arrival:<BR> Comments:<BR> Respiratory distress/ cpap

## 2012-07-10 NOTE — Progress Notes (Signed)
RT transferred PT from [facility] ED Resp. B to [facility] ICU 1225 on 100% fi02. Transport was uneventful- vent check completed, RN at bedside.

## 2012-07-10 NOTE — Progress Notes (Signed)
ANTIBIOTIC CONSULT NOTE - INITIAL  Pharmacy Consult for Vancomycin + Fortaz Indication: rule out pneumonia  Allergies  Allergen Reactions  . Clindamycin/Lincomycin Swelling    Patient Measurements: Height: 5' 7.32" (171 cm) Weight: 189 lb 6 oz (85.9 kg) IBW/kg (Calculated) : 66.84  Vital Signs: Temp src: Oral (06/30 1342) BP: 209/138 mmHg (06/30 1437) Pulse Rate: 140 (06/30 1437) Intake/Output from previous day:   Intake/Output from this shift:    Labs:  Recent Labs  07/10/12 1446  WBC 12.0*  HGB 12.1*  PLT 249  CREATININE 0.93   Estimated Creatinine Clearance: 103.3 ml/min (by C-G formula based on Cr of 0.93). No results found for this basename: VANCOTROUGH, VANCOPEAK, VANCORANDOM, GENTTROUGH, GENTPEAK, GENTRANDOM, TOBRATROUGH, TOBRAPEAK, TOBRARND, AMIKACINPEAK, AMIKACINTROU, AMIKACIN,  in the last 72 hours   Microbiology: No results found for this or any previous visit (from the past 720 hour(s)).  Medical History: Past Medical History  Diagnosis Date  . Hypertension   . Sarcoidosis   . OSA on CPAP   . Chronic respiratory failure     home oxygen PRN  . Bronchitis   . Pneumonia     Medications:  Scheduled:  . albuterol  2 puff Inhalation Q6H  . amitriptyline  25 mg Oral QHS  . amLODipine  10 mg Oral Daily  . [START ON 07/11/2012] cholecalciferol  1,000 Units Oral q morning - 10a  . docusate sodium  100 mg Oral Q12H  . famotidine  20 mg Oral BID  . fentaNYL      . fluticasone  2 spray Each Nare Daily  . heparin  5,000 Units Subcutaneous Q8H  . insulin aspart  0-9 Units Subcutaneous Q4H  . methylPREDNISolone (SOLU-MEDROL) injection  40 mg Intravenous Q12H  . neomycin-bacitracin-polymyxin      . traZODone  100 mg Oral QHS   Infusions:  . sodium chloride    . sodium chloride    . albuterol 10 mg/hr (07/10/12 1400)  . cefTAZidime (FORTAZ)  IV    . fentaNYL infusion INTRAVENOUS 25 mcg/hr (07/10/12 1609)  . magnesium sulfate 1 - 4 g bolus IVPB    .  propofol 20 mcg/kg/min (07/10/12 1614)  . vancomycin     Assessment:  47 year old male with advanced sarcoidosis, with occasional flares treated with prednisone  Comes to ER with acute respiratory failure.  Intubated in ED.  CrCl (n) = 100 ml/min  Vancomycin 1gm and Fortaz 1gm IV x 1 ordered in ED (not yet given)  Upon admission, Vancomycin and Fortaz to continue for suspected pneumonia  Blood and respiratory cultures ordered  Goal of Therapy:  Vancomycin trough level 15-20 mcg/ml  Plan:  Measure antibiotic drug levels at steady state Follow up culture results Vancomycin 1gm IV q8h Elita Quick 2gm IV q8h  Maryellen Pile, PharmD 07/10/2012,4:16 PM

## 2012-07-10 NOTE — Progress Notes (Signed)
UR completed 

## 2012-07-10 NOTE — Procedures (Signed)
Central Venous Catheter Insertion Procedure Note [name] [id number] 01-Aug-1965  Procedure: Insertion of Central Venous Catheter Indications: Assessment of intravascular volume, Drug and/or fluid administration and Frequent blood sampling  Procedure Details Consent: Risks of procedure as well as the alternatives and risks of each were explained to the (patient/caregiver).  Consent for procedure obtained. Time Out: Verified patient identification, verified procedure, site/side was marked, verified correct patient position, special equipment/implants available, medications/allergies/relevent history reviewed, required imaging and test results available.  Performed  Maximum sterile technique was used including antiseptics, cap, gloves, gown, hand hygiene, mask and sheet. Skin prep: Chlorhexidine; local anesthetic administered A antimicrobial bonded/coated triple lumen catheter was placed in the left subclavian vein using the Seldinger technique.  Evaluation Blood flow good Complications: No apparent complications Patient did tolerate procedure well. Chest X-ray ordered to verify placement.  CXR: normal.  [name],DANIEL J. 07/10/2012, 4:06 PM

## 2012-07-11 ENCOUNTER — Inpatient Hospital Stay (HOSPITAL_COMMUNITY): Payer: 59

## 2012-07-11 DIAGNOSIS — J939 Pneumothorax, unspecified: Secondary | ICD-10-CM | POA: Diagnosis present

## 2012-07-11 LAB — BLOOD GAS, ARTERIAL
Bicarbonate: 22 mEq/L (ref 20.0–24.0)
Drawn by: 11249
PEEP: 3 cmH2O
Patient temperature: 37
RATE: 12 resp/min
pH, Arterial: 7.272 — ABNORMAL LOW (ref 7.350–7.450)
pO2, Arterial: 94.3 mmHg (ref 80.0–100.0)

## 2012-07-11 LAB — GLUCOSE, CAPILLARY
Glucose-Capillary: 144 mg/dL — ABNORMAL HIGH (ref 70–99)
Glucose-Capillary: 122 mg/dL — ABNORMAL HIGH (ref 70–99)
Glucose-Capillary: 154 mg/dL — ABNORMAL HIGH (ref 70–99)
Glucose-Capillary: 117 mg/dL — ABNORMAL HIGH (ref 70–99)
Glucose-Capillary: 99 mg/dL (ref 70–99)

## 2012-07-11 LAB — BASIC METABOLIC PANEL
BUN: 12 mg/dL (ref 6–23)
CO2: 25 mEq/L (ref 19–32)
Calcium: 9.3 mg/dL (ref 8.4–10.5)
GFR calc non Af Amer: 86 mL/min — ABNORMAL LOW (ref >90)
Glucose, Bld: 143 mg/dL — ABNORMAL HIGH (ref 70–99)

## 2012-07-11 LAB — CBC
HCT: 37.7 % — ABNORMAL LOW (ref 39.0–52.0)
Hemoglobin: 11.4 g/dL — ABNORMAL LOW (ref 13.0–17.0)
MCH: 24.9 pg — ABNORMAL LOW (ref 26.0–34.0)
MCHC: 30.2 g/dL (ref 30.0–36.0)

## 2012-07-11 LAB — PROCALCITONIN: Procalcitonin: 1.85 ng/mL

## 2012-07-11 MED ORDER — ALBUTEROL SULFATE (5 MG/ML) 0.5% IN NEBU
2.5000 mg | INHALATION_SOLUTION | Freq: Four times a day (QID) | RESPIRATORY_TRACT | Status: DC
  Administered 2012-07-11 – 2012-07-13 (×9): 2.5 mg via RESPIRATORY_TRACT
  Filled 2012-07-11 (×10): qty 0.5

## 2012-07-11 MED ORDER — METOPROLOL TARTRATE 25 MG PO TABS
25.0000 mg | ORAL_TABLET | Freq: Two times a day (BID) | ORAL | Status: DC
  Administered 2012-07-11 – 2012-07-12 (×4): 25 mg via ORAL
  Filled 2012-07-11 (×4): qty 1

## 2012-07-11 MED ORDER — BIOTENE DRY MOUTH MT LIQD
15.0000 mL | Freq: Four times a day (QID) | OROMUCOSAL | Status: DC
  Administered 2012-07-11 – 2012-07-18 (×24): 15 mL via OROMUCOSAL

## 2012-07-11 MED ORDER — PREDNISONE 50 MG PO TABS
50.0000 mg | ORAL_TABLET | Freq: Every day | ORAL | Status: DC
  Administered 2012-07-12: 50 mg via ORAL
  Filled 2012-07-11 (×2): qty 1

## 2012-07-11 MED ORDER — CHLORHEXIDINE GLUCONATE 0.12 % MT SOLN
15.0000 mL | Freq: Two times a day (BID) | OROMUCOSAL | Status: DC
  Administered 2012-07-11 – 2012-07-18 (×13): 15 mL via OROMUCOSAL
  Filled 2012-07-11 (×15): qty 15

## 2012-07-11 MED ORDER — OXYCODONE-ACETAMINOPHEN 5-325 MG PO TABS
2.0000 | ORAL_TABLET | ORAL | Status: DC | PRN
  Administered 2012-07-11 – 2012-07-28 (×43): 2 via ORAL
  Filled 2012-07-11 (×43): qty 2

## 2012-07-11 MED ORDER — ALBUTEROL SULFATE (5 MG/ML) 0.5% IN NEBU
2.5000 mg | INHALATION_SOLUTION | RESPIRATORY_TRACT | Status: DC | PRN
  Administered 2012-07-11 – 2012-07-12 (×2): 2.5 mg via RESPIRATORY_TRACT
  Filled 2012-07-11: qty 0.5

## 2012-07-11 MED ORDER — MORPHINE SULFATE 2 MG/ML IJ SOLN
2.0000 mg | INTRAMUSCULAR | Status: DC | PRN
  Administered 2012-07-11 (×2): 2 mg via INTRAVENOUS
  Administered 2012-07-11: 4 mg via INTRAVENOUS
  Administered 2012-07-11: 2 mg via INTRAVENOUS
  Administered 2012-07-11: 4 mg via INTRAVENOUS
  Filled 2012-07-11 (×3): qty 1
  Filled 2012-07-11: qty 2
  Filled 2012-07-11: qty 1

## 2012-07-11 MED ORDER — SALINE SPRAY 0.65 % NA SOLN
1.0000 | NASAL | Status: DC | PRN
  Filled 2012-07-11 (×3): qty 44

## 2012-07-11 NOTE — Progress Notes (Signed)
PULMONARY  / CRITICAL CARE MEDICINE  Name: Jonanthan Bolender MRN: [id number] DOB: 1965-06-30    ADMISSION DATE:  07/10/2012 CONSULTATION DATE:  07/10/12  REFERRING MD :  [name] PRIMARY SERVICE: PCCM  CHIEF COMPLAINT:  Acute resp failure, tension ptx in setting advanced sarcoidosis  BRIEF PATIENT DESCRIPTION: 47 y/o male with PMH of sarcoidosis, OSA, HTN, and home O2 use who came into [facility] with ARF r/t tension pneumothorax.  Patient was intubated and chest tube placed by [name]>  SIGNIFICANT EVENTS / STUDIES:  6/17 ct chest>>>10.1 x 7.8 cm thin-walled cavitary/bullous lesion in theposterior right upper lobe, unchanged. Confluent perihilar opacities with traction bronchiectasis and architectural distortion, related to known sarcoidosis. Associated peribronchovascular nodularity bilaterally. 6/30 - intubated for ARF r/t tension PTX, chest tube placed.  LINES / TUBES: 6/30 Ett>>> 6/30 L chest tube>>> 6/30 L subclavian CVC>>  CULTURES: 6/30 resp>>> Few gram + cocci in pairs 6/30 BC>>>  ANTIBIOTICS: Ceftazidime 3/30>>> vanc 6/30>>>  REVIEW OF SYSTEMS:  Unable to obtain as patient is sedated and mechanically ventilated  SUBJECTIVE:  Improved after placement of chest tube, currently on weaning trial  VITAL SIGNS: Temp:  [98 F (36.7 C)-99.2 F (37.3 C)] 98.2 F (36.8 C) (07/01 0800) Pulse Rate:  [103-140] 106 (07/01 0700) Resp:  [11-38] 12 (07/01 0700) BP: (125-209)/(87-141) 145/113 mmHg (07/01 0700) SpO2:  [69 %-100 %] 100 % (07/01 0700) FiO2 (%):  [40 %-100 %] 40 % (07/01 0425) Weight:  [79.7 kg (175 lb 11.3 oz)-85.9 kg (189 lb 6 oz)] 82.2 kg (181 lb 3.5 oz) (07/01 0355) HEMODYNAMICS:   VENTILATOR SETTINGS: Vent Mode:  [-] PRVC FiO2 (%):  [40 %-100 %] 40 % Set Rate:  [12 bmp-20 bmp] 12 bmp Vt Set:  [530 mL-550 mL] 530 mL PEEP:  [3 cmH20-5 cmH20] 3 cmH20 Plateau Pressure:  [27 cmH20-37 cmH20] 30 cmH20  INTAKE / OUTPUT: Intake/Output     06/30 0701 - 07/01 0700 07/01 0701 -  07/02 0700   I.V. (mL/kg) 1788.8 (21.8) 117.1 (1.4)   IV Piggyback 350    Total Intake(mL/kg) 2138.8 (26) 117.1 (1.4)   Urine (mL/kg/hr) 855 50 (0.3)   Chest Tube 10    Total Output 865 50   Net +1273.8 +67.1          PHYSICAL EXAMINATION: General:  Middle aged male in NAD Neuro:  Awake, nonfocal, follow commands. MAE HEENT:  Atraumatic, PERRLA, neck supple. No masses Cardiovascular: Regular rythym, tachycardic, no murmurs Lungs:  Diminished on left, fine rhonchi throughout. Abdomen:  Soft, bs wnl no r,./g Musculoskeletal:  No edema Skin:  No rash, intact  LABS:  Recent Labs Lab 07/10/12 1427 07/10/12 1442 07/10/12 1446 07/10/12 1728 07/11/12 0406 07/11/12 0530  HGB  --   --  12.1*  --   --  11.4*  WBC  --   --  12.0*  --   --  9.4  PLT  --   --  249  --   --  281  NA  --   --  140  --   --  137  K  --   --  5.0  --   --  5.5*  CL  --   --  104  --   --  101  CO2  --   --  26  --   --  25  GLUCOSE  --   --  197*  --   --  143*  BUN  --   --  13  --   --  12  CREATININE  --   --  0.93  --   --  1.02  CALCIUM  --   --  8.8  --   --  9.3  MG  --   --   --   --   --  2.6*  PHOS  --   --   --   --   --  3.8  AST  --   --  31  --   --   --   ALT  --   --  21  --   --   --   ALKPHOS  --   --  103  --   --   --   BILITOT  --   --  0.3  --   --   --   PROT  --   --  7.2  --   --   --   ALBUMIN  --   --  3.4*  --   --   --   TROPONINI  --   --  <0.30  --   --   --   PROCALCITON  --   --   --  0.96  --  1.85  PROBNP  --  23.9  --   --   --   --   PHART 7.386  --   --   --  7.272*  --   PCO2ART 37.8  --   --   --  49.3*  --   PO2ART 459.0*  --   --   --  94.3  --     Recent Labs Lab 07/10/12 1801 07/10/12 2102 07/10/12 2300 07/11/12 0320 07/11/12 0752  GLUCAP 194* 174* 146* 144* 122*    CXR: 7/1: Small left apical pneumothorax and subq air. ETT, CVC, and CT in good position  ASSESSMENT / PLAN:  PULMONARY A: Acute on Chronic Respiratory failure - now  improved Left tension PTX - some residual PTX on CXR Hx of sarcoidosis - known rt bleb,  R/o PNA vs PE - less likely given negative D. Dimer and PCT.  Shock picture resolved with CT insertion P:   Keep chest tube to 20 cm suction until PPV d/c, then trial WS Wean vent as tolerated, maintain low PEEP per orders Extubate if tolerates Maintain O2 sat greater than 92% Pulmonary toilet Albuterol scheduled Daily PCXR while CT in place Vent bundle See ID  CARDIOVASCULAR A: SIRS vs Septic Shock - Initially tachycardic and hypotensive. Shock picture likely from tension pneumo. Resolved and now slightly hypertensive HX of HTN P:  Tele monitoring Norvasc ordered per home dose Add lopressor Taper steroids  RENAL A: Hyperkalemia - K 5.5, Cr normal  P:   Monitor BMP IVF as ordered Monitor UOP  GASTROINTESTINAL A:  No acute issues P:   PPI Advance diet as tolerated s/p extubation  HEMATOLOGIC A:  Anemia - patient baseline No other acute issues P:  Sub Q heparin for DVT Px Follow CBC and Coags  INFECTIOUS A: Acute respiratory failure - DD include PNA, PE, but unlikely.  Initial WBC 12, but likely due to stress response/HD steroids than infection. Remains afebrile and PCT mildly elevated. P:   Follow cultures D/c vanco 7/1 Follow PCT then D/C all abx Follow fever and temp curve  ENDOCRINE A:  Prednisone dependent - s/p recent taper P:   Taper steroids as ordered Follow CBG SSI PRN  NEUROLOGIC A:  Pain - r/t chest tube insertion Risk for delerium P:   PRN pain medication as ordered Supportive care CAM ICU monitoring  TODAY'S SUMMARY: Hx of sarcoidosis. Presented with ARF with likely spontaneous tension PTX as cause. Intubated and CT placed on arrival. Now with improvement in [facility] and clinical picture. Extubated in AM, CT to [facility]. D/c vanc.  I have personally obtained a history, examined the patient, evaluated laboratory and imaging results, formulated the assessment  and plan and placed orders. CRITICAL CARE: The patient is critically ill with multiple organ systems failure and requires high complexity decision making for assessment and support, frequent evaluation and titration of therapies, application of advanced monitoring technologies and extensive interpretation of multiple databases. Critical Care Time devoted to patient care services described in this note is 45 minutes.    Levy Pupa, MD, PhD 07/11/2012, 11:22 AM  Pulmonary and Critical Care [phone] or if no answer [phone]

## 2012-07-11 NOTE — Progress Notes (Signed)
Follow up note. Patient also has visits from his pastor.

## 2012-07-11 NOTE — Procedures (Signed)
Extubation Procedure Note  Patient Details:   Name: Kinta Martis DOB: [birth year] MRN: [id number]   Airway Documentation:  Airway 7.5 mm (Active)  Secured at (cm) 23 cm 07/11/2012  8:57 AM  Measured From Lips 07/11/2012  8:57 AM  Secured Location Right 07/11/2012  8:57 AM  Secured By [employer] 07/11/2012  8:57 AM  Tube Holder Repositioned Yes 07/11/2012  8:57 AM  Cuff Pressure (cm H2O) 28 cm H2O 07/11/2012  8:57 AM    Evaluation  O2 sats: stable throughout Complications: No apparent complications Patient did tolerate procedure well. Bilateral Breath Sounds: Diminished   Yes  Received verbal order @ 0910 by Anders Simmonds, NP to extubate, pt tolerated well, placed on 3L [place], pt able to speak clearly.  RT will continue to monitor.  Ellin Mayhew 07/11/2012, 9:22 AM

## 2012-07-11 NOTE — Progress Notes (Signed)
Visited with patient today. He had just been extubated and said he was feeling much better. He asked for prayer. Had prayer asking for peace and calm and continuing move toward increased wellness. He may be going up to another floor today, but not sure. Will check on him again later.

## 2012-07-12 ENCOUNTER — Inpatient Hospital Stay (HOSPITAL_COMMUNITY): Payer: 59

## 2012-07-12 DIAGNOSIS — G4733 Obstructive sleep apnea (adult) (pediatric): Secondary | ICD-10-CM

## 2012-07-12 LAB — CBC
HCT: 33.3 % — ABNORMAL LOW (ref 39.0–52.0)
Hemoglobin: 10.2 g/dL — ABNORMAL LOW (ref 13.0–17.0)
MCH: 25.2 pg — ABNORMAL LOW (ref 26.0–34.0)
MCHC: 30.6 g/dL (ref 30.0–36.0)
RBC: 4.05 MIL/uL — ABNORMAL LOW (ref 4.22–5.81)

## 2012-07-12 LAB — GLUCOSE, CAPILLARY
Glucose-Capillary: 105 mg/dL — ABNORMAL HIGH (ref 70–99)
Glucose-Capillary: 89 mg/dL (ref 70–99)
Glucose-Capillary: 96 mg/dL (ref 70–99)

## 2012-07-12 LAB — MAGNESIUM: Magnesium: 2.4 mg/dL (ref 1.5–2.5)

## 2012-07-12 LAB — BASIC METABOLIC PANEL
CO2: 31 mEq/L (ref 19–32)
Calcium: 9 mg/dL (ref 8.4–10.5)
Chloride: 103 mEq/L (ref 96–112)
Glucose, Bld: 106 mg/dL — ABNORMAL HIGH (ref 70–99)
Sodium: 138 mEq/L (ref 135–145)

## 2012-07-12 LAB — PROCALCITONIN: Procalcitonin: 1.13 ng/mL

## 2012-07-12 MED ORDER — METOPROLOL TARTRATE 1 MG/ML IV SOLN
INTRAVENOUS | Status: AC
  Filled 2012-07-12: qty 5

## 2012-07-12 MED ORDER — NALOXONE HCL 0.4 MG/ML IJ SOLN: 0.4000 mg | INTRAMUSCULAR | Status: DC | PRN

## 2012-07-12 MED ORDER — SODIUM CHLORIDE 0.9 % IJ SOLN: 9.0000 mL | INTRAMUSCULAR | Status: DC | PRN

## 2012-07-12 MED ORDER — LEVOFLOXACIN 750 MG PO TABS
750.0000 mg | ORAL_TABLET | Freq: Every day | ORAL | Status: AC
  Administered 2012-07-12 – 2012-07-16 (×5): 750 mg via ORAL
  Filled 2012-07-12 (×5): qty 1

## 2012-07-12 MED ORDER — GUAIFENESIN ER 600 MG PO TB12
1200.0000 mg | ORAL_TABLET | Freq: Two times a day (BID) | ORAL | Status: DC
  Administered 2012-07-12 – 2012-07-15 (×7): 1200 mg via ORAL
  Filled 2012-07-12 (×8): qty 2

## 2012-07-12 MED ORDER — PREDNISONE 20 MG PO TABS
40.0000 mg | ORAL_TABLET | Freq: Every day | ORAL | Status: DC
  Administered 2012-07-13 – 2012-07-14 (×2): 40 mg via ORAL
  Filled 2012-07-12 (×3): qty 2

## 2012-07-12 MED ORDER — HYDROMORPHONE 0.3 MG/ML IV SOLN
INTRAVENOUS | Status: DC
  Administered 2012-07-12: 1.5 mg via INTRAVENOUS
  Administered 2012-07-12: 1.8 mg via INTRAVENOUS
  Administered 2012-07-12: 20:00:00 via INTRAVENOUS
  Administered 2012-07-12: 0.6 mg via INTRAVENOUS
  Administered 2012-07-12: 10:00:00 via INTRAVENOUS
  Administered 2012-07-13: 2.1 mg via INTRAVENOUS
  Administered 2012-07-13: 0.9 mg via INTRAVENOUS
  Administered 2012-07-13: 17:00:00 via INTRAVENOUS
  Administered 2012-07-13: 1.8 mg via INTRAVENOUS
  Administered 2012-07-14: 2.64 mg via INTRAVENOUS
  Administered 2012-07-14: 1.2 mg via INTRAVENOUS
  Administered 2012-07-14: 3 mg via INTRAVENOUS
  Administered 2012-07-14: 1.5 mg via INTRAVENOUS
  Administered 2012-07-14: 0.9 mg via INTRAVENOUS
  Administered 2012-07-15: 0.6 mg via INTRAVENOUS
  Administered 2012-07-15: 0.9 mg via INTRAVENOUS
  Administered 2012-07-15 (×2): 0.3 mg via INTRAVENOUS
  Filled 2012-07-12 (×4): qty 25

## 2012-07-12 MED ORDER — ONDANSETRON HCL 4 MG/2ML IJ SOLN
4.0000 mg | Freq: Four times a day (QID) | INTRAMUSCULAR | Status: DC | PRN
  Administered 2012-07-12 – 2012-07-23 (×3): 4 mg via INTRAVENOUS
  Filled 2012-07-12 (×3): qty 2

## 2012-07-12 MED ORDER — DIPHENHYDRAMINE HCL 50 MG/ML IJ SOLN
12.5000 mg | Freq: Four times a day (QID) | INTRAMUSCULAR | Status: DC | PRN
  Administered 2012-07-12 – 2012-07-13 (×2): 12.5 mg via INTRAVENOUS
  Filled 2012-07-12 (×2): qty 1

## 2012-07-12 MED ORDER — DIPHENHYDRAMINE HCL 12.5 MG/5ML PO ELIX
12.5000 mg | ORAL_SOLUTION | Freq: Four times a day (QID) | ORAL | Status: DC | PRN
  Administered 2012-07-12 – 2012-07-16 (×2): 12.5 mg via ORAL
  Filled 2012-07-12 (×2): qty 5

## 2012-07-12 NOTE — Progress Notes (Signed)
PULMONARY  / CRITICAL CARE MEDICINE  Name: [name] MRN: [id number] DOB: Jul 26, 1965    ADMISSION DATE:  07/10/2012 CONSULTATION DATE:  07/10/12  REFERRING MD :  [name] PRIMARY SERVICE: PCCM  CHIEF COMPLAINT:  Acute resp failure, tension ptx in setting advanced sarcoidosis  BRIEF PATIENT DESCRIPTION: 47 y/o male with PMH of sarcoidosis, OSA, HTN, and home O2 use who came into [facility] with ARF r/t tension pneumothorax.  Patient was intubated and chest tube placed by [name]>  SIGNIFICANT EVENTS / STUDIES:  6/17 ct chest>>>10.1 x 7.8 cm thin-walled cavitary/bullous lesion in theposterior right upper lobe, unchanged. Confluent perihilar opacities with traction bronchiectasis and architectural distortion, related to known sarcoidosis. Associated peribronchovascular nodularity bilaterally. 6/30 - intubated for ARF r/t tension PTX, chest tube placed.  LINES / TUBES: 6/30 Ett>>>7/1 6/30 L chest tube>>> 6/30 L subclavian CVC>>  CULTURES: 6/30 resp>>> Few gram + cocci in pairs 6/30 BC>>>  ANTIBIOTICS: Ceftazidime 3/30>>>7/2 vanc 6/30>>>7/1 levaquin 7/2>>>  SUBJECTIVE:  Improved after placement of chest tube, currently on weaning trial  VITAL SIGNS: Temp:  [97.9 F (36.6 C)-98.5 F (36.9 C)] 97.9 F (36.6 C) (07/02 0400) Pulse Rate:  [85-120] 85 (07/02 0600) Resp:  [8-23] 9 (07/02 0600) BP: (127-176)/(85-129) 136/115 mmHg (07/02 0600) SpO2:  [98 %-100 %] 100 % (07/02 0600) FiO2 (%):  [40 %] 40 % (07/01 0857) 3 liters NP  INTAKE / OUTPUT: Intake/Output     07/01 0701 - 07/02 0700 07/02 0701 - 07/03 0700   P.O. 510    I.V. (mL/kg) 2317.1 (28.2)    IV Piggyback 150    Total Intake(mL/kg) 2977.1 (36.2)    Urine (mL/kg/hr) 5100 (2.6)    Chest Tube     Total Output 5100     Net -2122.9            PHYSICAL EXAMINATION: General:  Middle aged male in NAD Neuro:  Awake, nonfocal, follow commands. MAE, Oriented X 3 HEENT:  Atraumatic, PERRLA, neck supple. No masses Cardiovascular:  Regular rythym, tachycardic, no murmurs Lungs:  Diminished on left, fine rhonchi throughout. Marked [place] air w/ crepitus over entire thorax. 2/7 airleak  Abdomen:  Soft, bs wnl no r,./g Musculoskeletal:  No edema Skin:  No rash, intact  LABS:  Recent Labs Lab 07/10/12 1446 07/11/12 0530 07/12/12 0453  NA 140 137 138  K 5.0 5.5* 4.6  CL 104 101 103  CO2 26 25 31   BUN 13 12 11   CREATININE 0.93 1.02 1.05  GLUCOSE 197* 143* 106*    Recent Labs Lab 07/10/12 1446 07/11/12 0530 07/12/12 0453  HGB 12.1* 11.4* 10.2*  HCT 37.8* 37.7* 33.3*  WBC 12.0* 9.4 10.4  PLT 249 281 232    Recent Labs Lab 07/10/12 1446 07/10/12 1728 07/11/12 0530 07/12/12 0452 07/12/12 0453  PROCALCITON  --  0.96 1.85 1.13  --   WBC 12.0*  --  9.4  --  10.4    Recent Labs Lab 07/11/12 1548 07/11/12 2021 07/12/12 0105 07/12/12 0441 07/12/12 0735  GLUCAP 117* 99 105* 96 89    CXR: 7/2: CT good position. Marked [place] air. No obvious PTX.  ASSESSMENT / PLAN:  Acute on Chronic Respiratory failure - now improved, still c/o cough and difficulty w/ mucous clearance  Left tension PTX - still has airleak, no obvious PTX today on CXR Possible CAP vs purulent bronchitis  Hx of sarcoidosis - known rt bleb,  P:   Keep chest tube to 20 cm suction, if not  improved in next 24 hrs will ask thoracic surgery to eval Maintain O2 sat greater than 92% Pulmonary toilet Albuterol scheduled Wean steroids Add mucinex Daily PCXR while CT in place Change to oral levaquin, complete 5 more days Add pca 7/2  HX of HTN P:  Tele monitoring Norvasc ordered per home dose  Anemia - patient baseline/ suspect anemia of chronic disease.  P:  Sub Q heparin for DVT Px Follow CBC and Coags  Prednisone dependent - s/p recent taper P:   Taper steroids as ordered Follow CBG SSI PRN   TODAY'S SUMMARY: Hx of sarcoidosis. Presented with ARF with likely spontaneous tension PTX as cause. Still has airleak. Will keep on  sxn, if airleak persists off PP, then will need thoracic eval. Will change him to SDU status.    I have personally obtained a history, examined the patient, evaluated laboratory and imaging results, formulated the assessment and plan and placed orders.  Levy Pupa, MD, PhD 07/12/2012, 11:26 AM [place] [facility] [phone] or if no answer [phone]

## 2012-07-13 ENCOUNTER — Inpatient Hospital Stay (HOSPITAL_COMMUNITY): Payer: 59

## 2012-07-13 DIAGNOSIS — J930 Spontaneous tension pneumothorax: Secondary | ICD-10-CM

## 2012-07-13 LAB — CBC WITH DIFFERENTIAL/PLATELET
Eosinophils Relative: 3 % (ref 0–5)
HCT: 34.4 % — ABNORMAL LOW (ref 39.0–52.0)
Lymphocytes Relative: 12 % (ref 12–46)
Lymphs Abs: 0.8 10*3/uL (ref 0.7–4.0)
MCV: 81.9 fL (ref 78.0–100.0)
Monocytes Absolute: 0.6 10*3/uL (ref 0.1–1.0)
Monocytes Relative: 9 % (ref 3–12)
RBC: 4.2 MIL/uL — ABNORMAL LOW (ref 4.22–5.81)
WBC: 6.8 10*3/uL (ref 4.0–10.5)

## 2012-07-13 LAB — COMPREHENSIVE METABOLIC PANEL
ALT: 19 U/L (ref 0–53)
CO2: 33 mEq/L — ABNORMAL HIGH (ref 19–32)
Calcium: 9.4 mg/dL (ref 8.4–10.5)
Creatinine, Ser: 0.89 mg/dL (ref 0.50–1.35)
GFR calc Af Amer: >90 mL/min (ref >90)
GFR calc non Af Amer: >90 mL/min (ref >90)
Glucose, Bld: 103 mg/dL — ABNORMAL HIGH (ref 70–99)

## 2012-07-13 LAB — IRON AND TIBC
Iron: 38 ug/dL — ABNORMAL LOW (ref 42–135)
TIBC: 345 ug/dL (ref 215–435)

## 2012-07-13 LAB — GLUCOSE, CAPILLARY
Glucose-Capillary: 97 mg/dL (ref 70–99)
Glucose-Capillary: 77 mg/dL (ref 70–99)
Glucose-Capillary: 120 mg/dL — ABNORMAL HIGH (ref 70–99)
Glucose-Capillary: 108 mg/dL — ABNORMAL HIGH (ref 70–99)
Glucose-Capillary: 121 mg/dL — ABNORMAL HIGH (ref 70–99)

## 2012-07-13 LAB — VITAMIN B12: Vitamin B-12: 893 pg/mL (ref 211–911)

## 2012-07-13 LAB — TRIGLYCERIDES: Triglycerides: 175 mg/dL — ABNORMAL HIGH (ref <150)

## 2012-07-13 MED ORDER — POLYETHYLENE GLYCOL 3350 17 G PO PACK
17.0000 g | PACK | Freq: Every day | ORAL | Status: DC
  Administered 2012-07-13 – 2012-08-04 (×17): 17 g via ORAL
  Filled 2012-07-13 (×23): qty 1

## 2012-07-13 MED ORDER — METOPROLOL TARTRATE 50 MG PO TABS: 50.0000 mg | ORAL_TABLET | Freq: Two times a day (BID) | ORAL | Status: DC

## 2012-07-13 MED ORDER — AMITRIPTYLINE HCL 50 MG PO TABS
50.0000 mg | ORAL_TABLET | Freq: Every day | ORAL | Status: DC
  Administered 2012-07-13 – 2012-08-03 (×6): 50 mg via ORAL
  Filled 2012-07-13 (×24): qty 1

## 2012-07-13 MED ORDER — FLUTICASONE PROPIONATE HFA 44 MCG/ACT IN AERO
2.0000 | INHALATION_SPRAY | Freq: Two times a day (BID) | RESPIRATORY_TRACT | Status: DC
  Administered 2012-07-13 – 2012-07-25 (×24): 2 via RESPIRATORY_TRACT
  Filled 2012-07-13 (×2): qty 10.6

## 2012-07-13 MED ORDER — BISACODYL 10 MG RE SUPP
10.0000 mg | Freq: Every day | RECTAL | Status: DC | PRN
  Administered 2012-07-30: 10 mg via RECTAL
  Filled 2012-07-13: qty 1

## 2012-07-13 MED ORDER — METOPROLOL TARTRATE 50 MG PO TABS
50.0000 mg | ORAL_TABLET | Freq: Two times a day (BID) | ORAL | Status: DC
  Administered 2012-07-13 – 2012-08-03 (×43): 50 mg via ORAL
  Filled 2012-07-13 (×51): qty 1

## 2012-07-13 MED ORDER — METOPROLOL TARTRATE 1 MG/ML IV SOLN
2.5000 mg | INTRAVENOUS | Status: DC | PRN
  Administered 2012-07-13 (×2): 2.5 mg via INTRAVENOUS
  Administered 2012-07-14: 5 mg via INTRAVENOUS
  Administered 2012-07-14: 2.5 mg via INTRAVENOUS
  Filled 2012-07-13 (×3): qty 5

## 2012-07-13 MED ORDER — SENNOSIDES-DOCUSATE SODIUM 8.6-50 MG PO TABS
2.0000 | ORAL_TABLET | Freq: Two times a day (BID) | ORAL | Status: DC
  Administered 2012-07-13 – 2012-08-04 (×32): 2 via ORAL
  Filled 2012-07-13 (×48): qty 2

## 2012-07-13 MED ORDER — AMITRIPTYLINE HCL 25 MG PO TABS
25.0000 mg | ORAL_TABLET | Freq: Once | ORAL | Status: DC
  Filled 2012-07-13: qty 1

## 2012-07-13 MED ORDER — DEXTROMETHORPHAN POLISTIREX 30 MG/5ML PO LQCR
10.0000 mL | Freq: Two times a day (BID) | ORAL | Status: DC
  Administered 2012-07-13 – 2012-07-15 (×4): 60 mg via ORAL
  Filled 2012-07-13 (×6): qty 10

## 2012-07-13 NOTE — Progress Notes (Signed)
Patient put on non invasive CPAP through vent for work of breathing and shortness of breath. Patient very anxious. CPAP settings are 9/3...the patient settings at home is 10 cmH2O. Tried pt on 13/5 and patient said it was too much pressure. Adjusted settings to patients comfort. Patient is tolerating well. Will continue to monitor.

## 2012-07-13 NOTE — Progress Notes (Signed)
eLink Physician-Brief Progress Note Patient Name: [name] DOB: 07/22/1965 MRN: [id number]  Date of Service  07/13/2012   HPI/Events of Note   Pt anxious, on home cpap, BP elevated  eICU Interventions  Increase elavil, add qhs cpap, increased BB   Intervention Category Major Interventions: Other:  Shan Levans 07/13/2012, 2:03 AM

## 2012-07-13 NOTE — Progress Notes (Signed)
PULMONARY  / CRITICAL CARE MEDICINE  Name: Azariah Bonura MRN: [id number] DOB: 1965-08-22    ADMISSION DATE:  07/10/2012 CONSULTATION DATE:  07/10/12  REFERRING MD :  [name] PRIMARY SERVICE: PCCM  CHIEF COMPLAINT:  Acute resp failure, tension ptx in setting advanced sarcoidosis  BRIEF PATIENT DESCRIPTION: 47 y/o male with PMH of sarcoidosis, OSA, HTN, and home O2 use who came into [facility] with ARF r/t tension pneumothorax.  Patient was intubated and chest tube placed by [name]>  SIGNIFICANT EVENTS / STUDIES:  6/17 ct chest>>>10.1 x 7.8 cm thin-walled cavitary/bullous lesion in theposterior right upper lobe, unchanged. Confluent perihilar opacities with traction bronchiectasis and architectural distortion, related to known sarcoidosis. Associated peribronchovascular nodularity bilaterally. 6/30 - intubated for ARF r/t tension PTX, chest tube placed. 7/1 -  Extubated  LINES / TUBES: 6/30 Ett>>>7/1 6/30 L chest tube>>> 6/30 L subclavian CVC>>  CULTURES: 6/30 resp>>> Few gram + cocci in pairs 6/30 BC>>>  ANTIBIOTICS: Ceftazidime 3/30>>>7/2 vanc 6/30>>>7/1 levaquin 7/2>>>  SUBJECTIVE:  Developed some anxiety and respiratory distress overnight which required Bipap. Pain better controlled with dilaudid PCA, but drowsy this AM.  VITAL SIGNS: Temp:  [98.1 F (36.7 C)-99 F (37.2 C)] 98.1 F (36.7 C) (07/03 0800) Pulse Rate:  [77-106] 88 (07/03 0400) Resp:  [11-22] 18 (07/03 0800) BP: (129-188)/(93-143) 155/93 mmHg (07/03 0400) SpO2:  [96 %-100 %] 97 % (07/03 0800) FiO2 (%):  [40 %] 40 % (07/03 0327) 3 liters [place]  INTAKE / OUTPUT: Intake/Output     07/02 0701 - 07/03 0700 07/03 0701 - 07/04 0700   P.O.     I.V. (mL/kg) 1252.2 (15.2)    IV Piggyback     Total Intake(mL/kg) 1252.2 (15.2)    Urine (mL/kg/hr) 3920 (2)    Chest Tube 32 (0)    Total Output 3952     Net -2699.8            PHYSICAL EXAMINATION: General:  Middle aged male in NAD Neuro:  Awake but drowsy, nonfocal,  follow commands. MAE, Oriented X 3 HEENT:  Atraumatic, PERRLA, neck supple. No masses Cardiovascular: Regular rythym, tachycardic, no murmurs Lungs:  Diminished on left, expiratory wheezes throughout. Marked [place] air w/ crepitus over entire thorax and spreading to patients neck.  Abdomen:  Distended, firm, bs present. Musculoskeletal:  No edema Skin:  No rash, intact  LABS:  Recent Labs Lab 07/11/12 0530 07/12/12 0453 07/13/12 0500  NA 137 138 136  K 5.5* 4.6 3.7  CL 101 103 97  CO2 25 31 33*  BUN 12 11 8   CREATININE 1.02 1.05 0.89  GLUCOSE 143* 106* 103*    Recent Labs Lab 07/11/12 0530 07/12/12 0453 07/13/12 0500  HGB 11.4* 10.2* 10.6*  HCT 37.7* 33.3* 34.4*  WBC 9.4 10.4 6.8  PLT 281 232 265    Recent Labs Lab 07/10/12 1446 07/10/12 1728 07/11/12 0530 07/12/12 0452 07/12/12 0453 07/13/12 0500  PROCALCITON  --  0.96 1.85 1.13  --   --   WBC 12.0*  --  9.4  --  10.4 6.8    Recent Labs Lab 07/12/12 1546 07/12/12 2047 07/13/12 0032 07/13/12 0421 07/13/12 0736  GLUCAP 140* 129* 115* 97 77    CXR: 7/3: Diffuse subq emphysema. No definite pneumothorax visualized. Small amount of pneumomediastinum per radiology .   ASSESSMENT / PLAN:  Acute on Chronic Respiratory failure - now improved, still c/o cough and difficulty w/ mucous clearance  Left tension PTX - still has airleak, although  improved. Redressed during my exam with vasaline gauze applied. No obvious PTX today on CXR Possible CAP vs purulent bronchitis - PCT trending down, leukocytosis improved  Hx of sarcoidosis - known rt bleb P:   Keep chest tube to 20 cm suction. In dressing changed, use Vaseline gauze. Will consult Thoracic surgery to eval 7/3 Maintain O2 sat greater than 92% Pulmonary toilet Flutter Valve Albuterol scheduled and PRN Add home ICS Will hold on weaning steroids as patient is still wheezing Daily PCXR while CT in place ABX as above Continue PCA with EtCO2 monitoring Pt  consult Avoid NIPPV if possible given air leak  HX of HTN P:  Tele monitoring Norvasc ordered per home dose  Anemia - patient baseline/ suspect anemia of chronic disease.  P:  Sub Q heparin for DVT Px Follow CBC and Coags  Prednisone dependent - s/p recent taper P:   Maintain dose of steroids as patient still wheezing Follow CBG SSI PRN  Constipation - last BM 6/30. Likely related to immobility and narcotic administration. Now with some abdominal distension P: D/c colace  Add Senna- S BID Add Miralax Doculax suppository PRN  TODAY'S SUMMARY: Hx of sarcoidosis. Presented with ARF with likely spontaneous tension PTX as cause. Still has airleak on suction, will call thoracic to eval.  Will keep on SDU status.    I have personally obtained a history, examined the patient, evaluated laboratory and imaging results, formulated the assessment and plan and placed orders.  Richard Rosebrock S-ACNP 07/13/2012, 8:39 AM  Levy Pupa, MD, PhD 07/13/2012, 9:24 AM [place] [facility] [phone] or if no answer [phone]

## 2012-07-13 NOTE — Consult Note (Signed)
[address]       [name] [address]             [phone]        Reason for Consult: Persistent air leak s/p tension pneumothorax with Sarcoid lung disease. Referring Physician: Dr. Levy Pupa  [name] is an 47 y.o. male.  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 He was intubated and had a left chest tube placed by the ER physician. The chest tube looks like it went into the fissure but the left ptx was markedly reduced with a small residual. There was initially a small amount of subcutaneous emphysema around the tube but this has subsequently spread out over the chest and neck. CXR today does not show any increase in the subcutaneous emphysema compared to yesterday. He had a chest CT scan on 6/17 for followup of his sarcoid which looked stable compared to a scan in February. Since admission he has been very anxious and has episodes of dyspnea that are improved with bipap.  Past Medical History  Diagnosis Date  . Hypertension   . Sarcoidosis   . OSA on CPAP   . Chronic respiratory failure     home oxygen PRN  . Bronchitis   . Pneumonia     Past Surgical History  Procedure Laterality Date  . Rotator cuff repair    . Arm surgery       Family History  Problem Relation Age of Onset  . Diabetes Father     Social History:  reports that he has never smoked. He has never used smokeless tobacco. He reports that  drinks alcohol. He reports that he does not use illicit drugs.  Allergies:  Allergies  Allergen Reactions  . Clindamycin/Lincomycin Swelling    Medications:  I have reviewed the patient's current medications. Prior to Admission:  Prescriptions prior to admission  Medication Sig Dispense Refill  . albuterol (PROVENTIL) (2.5 MG/3ML) 0.083% nebulizer solution 2.5 mg. 1 vial in nebulizer every  6 hours as needed      . amitriptyline (ELAVIL) 25 MG tablet Take 25 mg by mouth at bedtime. For sleep      . amLODipine (NORVASC) 10 MG tablet Take 1 tablet (10 mg total) by mouth daily.  30 tablet  0  . azithromycin (ZITHROMAX) 250 MG tablet Take 1 tablet (250 mg total) by mouth daily.  6 tablet  0  . beclomethasone (QVAR) 80 MCG/ACT inhaler Inhale 2 puffs into the lungs 2 (two) times daily.      . calcium-vitamin D (OSCAL WITH D) 500-200 MG-UNIT per tablet Take 1 tablet by mouth every morning.      . chlorthalidone (HYGROTON) 25 MG tablet Take 25 mg by mouth every morning.       . cholecalciferol (VITAMIN D) 1000 UNITS tablet Take 1,000 Units by mouth every morning.       . cyclobenzaprine (FLEXERIL) 10 MG tablet Take 10 mg by mouth 3 (three) times daily as needed. Muscle spasms.      . diphenhydrAMINE (BENADRYL) 25 MG tablet Take 25 mg by mouth every 6 (six) hours as needed. Allergies      . docusate sodium (COLACE) 100 MG capsule Take 1 capsule (100 mg total) by mouth every 12 (twelve) hours.  60 capsule  0  . famotidine (PEPCID) 20 MG tablet Take 1 tablet (20 mg total) by mouth 2 (two) times daily.  60 tablet  1  . ferrous sulfate 325 (65 FE) MG tablet Take 1 tablet (325 mg total) by mouth 2 (two) times daily with a meal.  60 tablet  0  . fluticasone (FLONASE) 50 MCG/ACT nasal spray Place 2 sprays into the nose daily.  16 g  1  . guaiFENesin (MUCINEX) 600 MG 12 hr tablet Take 1,200 mg by mouth 2 (two) times daily. For congestion.      . lidocaine (LIDODERM) 5 % Place 1 patch onto the skin daily. Remove & Discard patch within 12 hours or as directed by MD      . metoprolol tartrate (LOPRESSOR) 25 MG tablet 25 mg. Take 1 tablet (25 mg total) by mouth 2 times daily.      . Multiple Vitamins-Minerals (MULTIVITAMIN WITH MINERALS) tablet Take 1 tablet by mouth every morning.       Marland Kitchen omeprazole (PRILOSEC) 40 MG capsule 40 mg. Take 1 capsule (40 mg total) by mouth 2 times daily.      . predniSONE  (DELTASONE) 10 MG tablet Take 40mg  daily for 3 days, then 30mg  daily for 3 days, then 20mg  daily for 3 days, then 10mg  daily for 3 days, then stop  30 tablet  0  . pregabalin (LYRICA) 200 MG capsule Take 200 mg by mouth 2 (two) times daily.      . promethazine (PHENERGAN) 25 MG tablet 25 mg. Take 1 tablet (25 mg total) by mouth every 6 (six) hours as needed for Nausea.      . sodium chloride (OCEAN) 0.65 % SOLN nasal spray Place 2 sprays into the nose 4 (four) times daily.      . traZODone (DESYREL) 100 MG tablet Take 100 mg by mouth at bedtime. sleep      . albuterol (PROVENTIL HFA;VENTOLIN HFA) 108 (90 BASE) MCG/ACT inhaler Inhale 2 puffs into the lungs every 6 (six) hours as needed. Wheezing       Scheduled: . albuterol  2.5 mg Nebulization Q6H  . amitriptyline  25 mg Oral Once  . amitriptyline  50 mg Oral QHS  . amLODipine  10 mg Oral Daily  . antiseptic oral rinse  15 mL Mouth Rinse QID  . chlorhexidine  15 mL Mouth Rinse BID  . cholecalciferol  1,000 Units Oral q morning - 10a  . dextromethorphan  10 mL Oral BID  . famotidine  20 mg Oral BID  . fluticasone  2 spray Each Nare Daily  . fluticasone  2 puff Inhalation BID  . guaiFENesin  1,200 mg Oral BID  . heparin  5,000 Units Subcutaneous Q8H  . HYDROmorphone PCA 0.3 mg/mL   Intravenous Q4H  . insulin aspart  0-9 Units Subcutaneous Q4H  . levofloxacin  750 mg Oral Daily  . metoprolol tartrate  50 mg Oral BID  . polyethylene glycol  17 g Oral Daily  . predniSONE  40 mg Oral Q breakfast  . senna-docusate  2 tablet Oral BID  . traZODone  100 mg Oral QHS   Continuous: . sodium chloride 50 mL/hr at 07/13/12 1419   AVW:UJWJXB chloride, albuterol, bisacodyl, diphenhydrAMINE, diphenhydrAMINE, metoprolol, naloxone, ondansetron (ZOFRAN) IV, oxyCODONE-acetaminophen, sodium chloride, sodium chloride, sodium chloride  Results for orders placed during the hospital encounter of 07/10/12 (from the past 48 hour(s))  GLUCOSE, CAPILLARY      Status: Abnormal   Collection Time  07/12/12  1:05 AM      Result Value Range   Glucose-Capillary 105 (*) 70 - 99 mg/dL  GLUCOSE, CAPILLARY     Status: None   Collection Time    07/12/12  4:41 AM      Result Value Range   Glucose-Capillary 96  70 - 99 mg/dL  PROCALCITONIN     Status: None   Collection Time    07/12/12  4:52 AM      Result Value Range   Procalcitonin 1.13     Comment:            Interpretation:     PCT > 0.5 ng/mL and <= 2 ng/mL:     Systemic infection (sepsis) is possible,     but other conditions are known to elevate     PCT as well.     (NOTE)             ICU PCT Algorithm               Non ICU PCT Algorithm        ----------------------------     ------------------------------             PCT < 0.25 ng/mL                 PCT < 0.1 ng/mL         Stopping of antibiotics            Stopping of antibiotics           strongly encouraged.               strongly encouraged.        ----------------------------     ------------------------------           PCT level decrease by               PCT < 0.25 ng/mL           >= 80% from peak PCT           OR PCT 0.25 - 0.5 ng/mL          Stopping of antibiotics                                                 encouraged.         Stopping of antibiotics               encouraged.        ----------------------------     ------------------------------           PCT level decrease by              PCT >= 0.25 ng/mL           < 80% from peak PCT            AND PCT >= 0.5 ng/mL            Continuing antibiotics                                                  encouraged.           Continuing antibiotics  encouraged.        ----------------------------     ------------------------------         PCT level increase compared          PCT > 0.5 ng/mL             with peak PCT AND              PCT >= 0.5 ng/mL             Escalation of antibiotics                                              strongly encouraged.           Escalation of antibiotics            strongly encouraged.  CBC     Status: Abnormal   Collection Time    07/12/12  4:53 AM      Result Value Range   WBC 10.4  4.0 - 10.5 K/uL   RBC 4.05 (*) 4.22 - 5.81 MIL/uL   Hemoglobin 10.2 (*) 13.0 - 17.0 g/dL   HCT 30.8 (*) 65.7 - 84.6 %   MCV 82.2  78.0 - 100.0 fL   MCH 25.2 (*) 26.0 - 34.0 pg   MCHC 30.6  30.0 - 36.0 g/dL   RDW 96.2  95.2 - 84.1 %   Platelets 232  150 - 400 K/uL  MAGNESIUM     Status: None   Collection Time    07/12/12  4:53 AM      Result Value Range   Magnesium 2.4  1.5 - 2.5 mg/dL  BASIC METABOLIC PANEL     Status: Abnormal   Collection Time    07/12/12  4:53 AM      Result Value Range   Sodium 138  135 - 145 mEq/L   Potassium 4.6  3.5 - 5.1 mEq/L   Chloride 103  96 - 112 mEq/L   CO2 31  19 - 32 mEq/L   Glucose, Bld 106 (*) 70 - 99 mg/dL   BUN 11  6 - 23 mg/dL   Creatinine, Ser 3.24  0.50 - 1.35 mg/dL   Calcium 9.0  8.4 - 40.1 mg/dL   GFR calc non Af Amer 83 (*) >90 mL/min   GFR calc Af Amer >90  >90 mL/min   Comment:            The eGFR has been calculated     using the CKD EPI equation.     This calculation has not been     validated in all clinical     situations.     eGFR's persistently     <90 mL/min signify     possible Chronic Kidney Disease.  GLUCOSE, CAPILLARY     Status: None   Collection Time    07/12/12  7:35 AM      Result Value Range   Glucose-Capillary 89  70 - 99 mg/dL  GLUCOSE, CAPILLARY     Status: None   Collection Time    07/12/12 11:59 AM      Result Value Range   Glucose-Capillary 96  70 - 99 mg/dL  GLUCOSE, CAPILLARY     Status: Abnormal   Collection Time    07/12/12  3:46 PM      Result Value Range  Glucose-Capillary 140 (*) 70 - 99 mg/dL   Comment 1 Notify RN     Comment 2 Documented in Chart    GLUCOSE, CAPILLARY     Status: Abnormal   Collection Time    07/12/12  8:47 PM      Result Value Range   Glucose-Capillary 129 (*) 70 - 99 mg/dL  GLUCOSE, CAPILLARY      Status: Abnormal   Collection Time    07/13/12 12:32 AM      Result Value Range   Glucose-Capillary 115 (*) 70 - 99 mg/dL   Comment 1 Documented in Chart     Comment 2 Notify RN    GLUCOSE, CAPILLARY     Status: None   Collection Time    07/13/12  4:21 AM      Result Value Range   Glucose-Capillary 97  70 - 99 mg/dL   Comment 1 Documented in Chart     Comment 2 Notify RN    TRIGLYCERIDES     Status: Abnormal   Collection Time    07/13/12  5:00 AM      Result Value Range   Triglycerides 175 (*) <150 mg/dL  VITAMIN O13     Status: None   Collection Time    07/13/12  5:00 AM      Result Value Range   Vitamin B-12 893  211 - 911 pg/mL  FOLATE     Status: None   Collection Time    07/13/12  5:00 AM      Result Value Range   Folate 16.8     Comment: (NOTE)     Reference Ranges            Deficient:       0.4 - 3.3 ng/mL            Indeterminate:   3.4 - 5.4 ng/mL            Normal:              > 5.4 ng/mL  IRON AND TIBC     Status: Abnormal   Collection Time    07/13/12  5:00 AM      Result Value Range   Iron 38 (*) 42 - 135 ug/dL   TIBC 086  578 - 469 ug/dL   Saturation Ratios 11 (*) 20 - 55 %   UIBC 307  125 - 400 ug/dL  FERRITIN     Status: None   Collection Time    07/13/12  5:00 AM      Result Value Range   Ferritin 37  22 - 322 ng/mL  RETICULOCYTES     Status: Abnormal   Collection Time    07/13/12  5:00 AM      Result Value Range   Retic Ct Pct 1.2  0.4 - 3.1 %   RBC. 4.20 (*) 4.22 - 5.81 MIL/uL   Retic Count, Manual 50.4  19.0 - 186.0 K/uL  CBC WITH DIFFERENTIAL     Status: Abnormal   Collection Time    07/13/12  5:00 AM      Result Value Range   WBC 6.8  4.0 - 10.5 K/uL   RBC 4.20 (*) 4.22 - 5.81 MIL/uL   Hemoglobin 10.6 (*) 13.0 - 17.0 g/dL   HCT 62.9 (*) 52.8 - 41.3 %   MCV 81.9  78.0 - 100.0 fL   MCH 25.2 (*) 26.0 - 34.0 pg   MCHC 30.8  30.0 - 36.0 g/dL   RDW 16.1  09.6 - 04.5 %   Platelets 265  150 - 400 K/uL   Neutrophils Relative % 76  43 -  77 %   Neutro Abs 5.2  1.7 - 7.7 K/uL   Lymphocytes Relative 12  12 - 46 %   Lymphs Abs 0.8  0.7 - 4.0 K/uL   Monocytes Relative 9  3 - 12 %   Monocytes Absolute 0.6  0.1 - 1.0 K/uL   Eosinophils Relative 3  0 - 5 %   Eosinophils Absolute 0.2  0.0 - 0.7 K/uL   Basophils Relative 0  0 - 1 %   Basophils Absolute 0.0  0.0 - 0.1 K/uL  COMPREHENSIVE METABOLIC PANEL     Status: Abnormal   Collection Time    07/13/12  5:00 AM      Result Value Range   Sodium 136  135 - 145 mEq/L   Potassium 3.7  3.5 - 5.1 mEq/L   Comment: DELTA CHECK NOTED     REPEATED TO VERIFY   Chloride 97  96 - 112 mEq/L   CO2 33 (*) 19 - 32 mEq/L   Glucose, Bld 103 (*) 70 - 99 mg/dL   BUN 8  6 - 23 mg/dL   Creatinine, Ser 4.09  0.50 - 1.35 mg/dL   Calcium 9.4  8.4 - 81.1 mg/dL   Total Protein 6.8  6.0 - 8.3 g/dL   Albumin 3.2 (*) 3.5 - 5.2 g/dL   AST 26  0 - 37 U/L   ALT 19  0 - 53 U/L   Alkaline Phosphatase 79  39 - 117 U/L   Total Bilirubin 0.2 (*) 0.3 - 1.2 mg/dL   GFR calc non Af Amer >90  >90 mL/min   GFR calc Af Amer >90  >90 mL/min   Comment:            The eGFR has been calculated     using the CKD EPI equation.     This calculation has not been     validated in all clinical     situations.     eGFR's persistently     <90 mL/min signify     possible Chronic Kidney Disease.  GLUCOSE, CAPILLARY     Status: None   Collection Time    07/13/12  7:36 AM      Result Value Range   Glucose-Capillary 77  70 - 99 mg/dL   Comment 1 Documented in Chart     Comment 2 Notify RN    GLUCOSE, CAPILLARY     Status: Abnormal   Collection Time    07/13/12 11:26 AM      Result Value Range   Glucose-Capillary 120 (*) 70 - 99 mg/dL   Comment 1 Notify RN     Comment 2 Documented in Chart    GLUCOSE, CAPILLARY     Status: Abnormal   Collection Time    07/13/12  5:13 PM      Result Value Range   Glucose-Capillary 108 (*) 70 - 99 mg/dL    Dg Chest Port 1 View  07/13/2012   *RADIOLOGY REPORT*  Clinical Data:  Follow up left pneumothorax.  PORTABLE CHEST - 1 VIEW  Comparison: 07/12/2012.  Findings: Chest radiograph is essentially unchanged compared to prior exam.  Left thoracostomy tube remains present.  There is no pleural line visualized. Support apparatus is unchanged. Pneumomediastinum is present, with gas outlining the cardiopericardial  silhouette and aortic arch.  IMPRESSION:  1.  Unchanged support apparatus. 2.  Diffuse soft tissue emphysema.  No definite pneumothorax visualized. 3.  Small amount of pneumomediastinum, not unexpected given the amount of soft tissue emphysema.   Original Report Authenticated By: Andreas Newport, M.D.   Dg Chest Port 1 View  07/12/2012   *RADIOLOGY REPORT*  Clinical Data: Evaluate pneumothorax  PORTABLE CHEST - 1 VIEW  Comparison: Portable chest x-ray of 07/11/2012  Findings: There is still a large amount of subcutaneous emphysema within the chest wall and soft tissues of the lower neck.  No definite pneumothorax is seen in left chest tube remains.  The endotracheal tube has been removed, and the left central venous line remains unchanged in position.  IMPRESSION:  1.  The endotracheal tube has been removed.  Perhaps slightly better aeration. 2.  Diffuse subcutaneous emphysema.  No pneumothorax.  Left chest tube remains.   Original Report Authenticated By: Dwyane Dee, M.D.    Review of Systems  Constitutional: Negative for fever, chills, weight loss and malaise/fatigue.  HENT: Negative.   Eyes: Negative.   Respiratory: Positive for cough, sputum production and shortness of breath. Negative for hemoptysis.   Cardiovascular: Negative.   Gastrointestinal: Negative.   Genitourinary: Negative.   Musculoskeletal: Negative.   Skin: Negative.   Neurological: Negative.   Endo/Heme/Allergies: Negative.   Psychiatric/Behavioral: The patient is nervous/anxious.    Blood pressure 156/114, pulse 92, temperature 97.6 F (36.4 C), temperature source Oral, resp. rate 20, height 5'  7.32" (1.71 m), weight 82.2 kg (181 lb 3.5 oz), SpO2 100.00%. Physical Exam  Constitutional: He is oriented to person, place, and time. He appears well-developed and well-nourished. No distress.  HENT:  Head: Normocephalic and atraumatic.  Mouth/Throat: Oropharynx is clear and moist.  Subcutaneous emphysema over neck with squeaky voice.  Eyes: Conjunctivae and EOM are normal. Pupils are equal, round, and reactive to light.  Neck: Normal range of motion. Neck supple. No JVD present. No tracheal deviation present. No thyromegaly present.  Cardiovascular: Normal rate, regular rhythm and intact distal pulses.  Exam reveals no gallop and no friction rub.   No murmur heard. Respiratory: Effort normal. No stridor. He has no wheezes.  Rhonchi bilat. Subcutaneous emphysema bilat over chest.  Chest tube has constant 2-5 chamber air leak.  GI: Soft. Bowel sounds are normal. He exhibits no distension and no mass. There is no tenderness.  Musculoskeletal: Normal range of motion. He exhibits no edema.  Lymphadenopathy:    He has no cervical adenopathy.  Neurological: He is alert and oriented to person, place, and time. He has normal strength. No cranial nerve deficit or sensory deficit.  Skin: Skin is warm and dry.  Psychiatric: He has a normal mood and affect.    06/27/2012 *RADIOLOGY REPORT*  Clinical Data: Sarcoidosis, congestion, cough  CT CHEST WITHOUT CONTRAST  Technique: Multidetector CT imaging of the chest was performed  following the standard protocol without IV contrast.  Comparison: CTA chest dated 02/14/2012  Findings: 10.1 x 7.8 cm thin-walled cavitary/bullous lesion in the  posterior right upper lobe (series 4/image 20), unchanged.  Confluent perihilar opacities with traction bronchiectasis and  architectural distortion, related to known sarcoidosis. Associated  peribronchovascular nodularity bilaterally.  On inspiratory / expiratory imaging, there is no evidence of air    trapping.  No superimposed opacities suspicious for pneumonia. No pleural  effusion or pneumothorax.  Heart is normal in size. No pericardial effusion.  Partially calcified mediastinal lymphadenopathy, including:  --  1.3 cm short-axis right paratracheal node (series 2/image 17)  --1.0 cm short-axis AP window node (series 2/image 18)  --1.9 cm short-axis subcarinal node (series 2/image 27)  Associated hilar lymphadenopathy is suspected but difficult to  discretely measure in the absence of intravenous contrast  administration. The overall appearance is unchanged.  Visualized upper abdomen is unremarkable.  Visualized osseous structures are within normal limits.  IMPRESSION:  Stable pulmonary and mediastinal findings of sarcoidosis, as  described above.  Original Report Authenticated By: Charline Bills, M.D.    Assessment/Plan:  He has significant chronic pulmonary sarcoid with a first episode of spontaneous tension left ptx. His lung was reexpanded with a chest tube  But he has a persistent air leak. This is probably made worse by underlying lung disease, steroids, and use of positive pressure breathing assistance. Today is the 4th day since the chest tube was inserted and it is unlikely that the air leak will stop soon. The options are to continue observation or do a VATS with stapling of the air leak and mechanical pleurodesis. He does have a few small blebs on the left side that may be responsible. With his severe lung disease and steroid use it is possible that surgery could make things worse. I discussed this with him. I will discuss the case with pulmonary medicine tomorrow and come up with a plan.  Alleen Borne 07/13/2012, 9:44 PM

## 2012-07-14 ENCOUNTER — Inpatient Hospital Stay (HOSPITAL_COMMUNITY): Payer: 59

## 2012-07-14 DIAGNOSIS — J930 Spontaneous tension pneumothorax: Secondary | ICD-10-CM

## 2012-07-14 LAB — GLUCOSE, CAPILLARY
Glucose-Capillary: 92 mg/dL (ref 70–99)
Glucose-Capillary: 132 mg/dL — ABNORMAL HIGH (ref 70–99)
Glucose-Capillary: 130 mg/dL — ABNORMAL HIGH (ref 70–99)

## 2012-07-14 LAB — CBC
Hemoglobin: 11.6 g/dL — ABNORMAL LOW (ref 13.0–17.0)
MCH: 25.1 pg — ABNORMAL LOW (ref 26.0–34.0)
Platelets: 346 10*3/uL (ref 150–400)
RBC: 4.63 MIL/uL (ref 4.22–5.81)
WBC: 8.7 10*3/uL (ref 4.0–10.5)

## 2012-07-14 LAB — BASIC METABOLIC PANEL
CO2: 34 mEq/L — ABNORMAL HIGH (ref 19–32)
Chloride: 99 mEq/L (ref 96–112)
Glucose, Bld: 110 mg/dL — ABNORMAL HIGH (ref 70–99)
Potassium: 3.9 mEq/L (ref 3.5–5.1)
Sodium: 140 mEq/L (ref 135–145)

## 2012-07-14 LAB — CULTURE, RESPIRATORY W GRAM STAIN

## 2012-07-14 MED ORDER — PREDNISONE 20 MG PO TABS
20.0000 mg | ORAL_TABLET | Freq: Every day | ORAL | Status: DC
  Administered 2012-07-15 – 2012-07-16 (×2): 20 mg via ORAL
  Filled 2012-07-14 (×3): qty 1

## 2012-07-14 MED ORDER — DILTIAZEM HCL 100 MG IV SOLR
5.0000 mg/h | INTRAVENOUS | Status: DC
  Administered 2012-07-14: 5 mg/h via INTRAVENOUS
  Administered 2012-07-14: 15 mg/h via INTRAVENOUS
  Administered 2012-07-14: 5 mg/h via INTRAVENOUS
  Filled 2012-07-14: qty 100

## 2012-07-14 MED ORDER — DILTIAZEM LOAD VIA INFUSION
20.0000 mg | Freq: Once | INTRAVENOUS | Status: AC
  Administered 2012-07-14: 20 mg via INTRAVENOUS
  Filled 2012-07-14: qty 20

## 2012-07-14 MED ORDER — LORAZEPAM 2 MG/ML IJ SOLN
1.0000 mg | INTRAMUSCULAR | Status: DC | PRN
  Administered 2012-07-14 – 2012-07-30 (×12): 1 mg via INTRAVENOUS
  Filled 2012-07-14 (×13): qty 1

## 2012-07-14 MED ORDER — LEVALBUTEROL HCL 0.63 MG/3ML IN NEBU
0.6300 mg | INHALATION_SOLUTION | Freq: Four times a day (QID) | RESPIRATORY_TRACT | Status: DC
  Administered 2012-07-14 – 2012-08-04 (×79): 0.63 mg via RESPIRATORY_TRACT
  Filled 2012-07-14 (×129): qty 3

## 2012-07-14 NOTE — Progress Notes (Signed)
  Subjective:  Anxiety and says he feels like he can't breath at times.  Objective: Vital signs in last 24 hours: Temp:  [97 F (36.1 C)-99 F (37.2 C)] 97 F (36.1 C) (07/04 1200) Pulse Rate:  [40-213] 81 (07/04 1415) Cardiac Rhythm:  [-] Atrial fibrillation (07/04 1415) Resp:  [9-26] 13 (07/04 1415) BP: (111-194)/(74-134) 122/104 mmHg (07/04 1415) SpO2:  [92 %-100 %] 95 % (07/04 1437)  Hemodynamic parameters for last 24 hours:    Intake/Output from previous day: 07/03 0701 - 07/04 0700 In: 1275.9 [I.V.:1275.9] Out: 3445 [Urine:3425; Chest Tube:20] Intake/Output this shift: Total I/O In: 325 [I.V.:325] Out: 500 [Urine:500]  General appearance: alert and cooperative Lungs: clear to auscultation bilaterally subcutaneous emphysema stable 1-5/7 air leak from chest tube. It increases to 5 with coughing.  Lab Results:  Recent Labs  07/13/12 0500 07/14/12 0500  WBC 6.8 8.7  HGB 10.6* 11.6*  HCT 34.4* 38.2*  PLT 265 346   BMET:  Recent Labs  07/13/12 0500 07/14/12 0500  NA 136 140  K 3.7 3.9  CL 97 99  CO2 33* 34*  GLUCOSE 103* 110*  BUN 8 10  CREATININE 0.89 0.93  CALCIUM 9.4 9.7    PT/INR: No results found for this basename: LABPROT, INR,  in the last 72 hours ABG    Component Value Date/Time   PHART 7.272* 07/11/2012 0406   HCO3 22.0 07/11/2012 0406   TCO2 20.6 07/11/2012 0406   ACIDBASEDEF 4.5* 07/11/2012 0406   O2SAT 96.5 07/11/2012 0406   CBG (last 3)   Recent Labs  07/13/12 2146 07/14/12 0741 07/14/12 1145  GLUCAP 121* 92 136*   CXR stable. No ptx seen.  Assessment/Plan:  Pulmonary sarcoid with spontaneous left tension ptx. His air leak may not stop on its own and may require VATS for bleb stapling. I will observe over the weekend and plan surgery Monday if the air leak does not stop.   LOS: 4 days    Marlin Jarrard K 07/14/2012

## 2012-07-14 NOTE — Progress Notes (Signed)
PT Cancellation Note  Patient Details Name: [name] MRN: [id number] DOB: [birth year]   Cancelled Treatment:    Reason Eval/Treat Not Completed: Medical issues which prohibited therapy (Started on diltazem drip for HR. )   Rada Hay 07/14/2012, 7:36 AM

## 2012-07-14 NOTE — Progress Notes (Signed)
eLink Physician-Brief Progress Note Patient Name: [name] DOB: May 05, 1965 MRN: [id number]  Date of Service  07/14/2012   HPI/Events of Note  Persistent tachycardia, note echo 2/14: ef 70%  eICU Interventions  Start Dilt drip   Intervention Category Major Interventions: Arrhythmia - evaluation and management  Shan Levans 07/14/2012, 2:03 AM

## 2012-07-14 NOTE — Progress Notes (Addendum)
PULMONARY  / CRITICAL CARE MEDICINE  Name: [name] MRN: [id number] DOB: 1965/05/04    ADMISSION DATE:  07/10/2012 CONSULTATION DATE:  07/10/12  REFERRING MD :  [name] PRIMARY SERVICE: PCCM  CHIEF COMPLAINT:  Acute resp failure, tension ptx in setting advanced sarcoidosis  BRIEF PATIENT DESCRIPTION: 47 y/o male with PMH of sarcoidosis, OSA, HTN, and home O2 use who came into [facility] with ARF r/t tension pneumothorax.  Patient was intubated and chest tube placed by [name]>  SIGNIFICANT EVENTS / STUDIES:  6/17 ct chest>>>10.1 x 7.8 cm thin-walled cavitary/bullous lesion in theposterior right upper lobe, unchanged. Confluent perihilar opacities with traction bronchiectasis and architectural distortion, related to known sarcoidosis. Associated peribronchovascular nodularity bilaterally. 6/30 - intubated for ARF r/t tension PTX, chest tube placed. 7/1 -  Extubated  LINES / TUBES: 6/30 Ett>>>7/1 6/30 L chest tube>>> 6/30 L subclavian CVC>>  CULTURES: 6/30 resp>>> Few gram + cocci in pairs 6/30 BC>>>  ANTIBIOTICS: Ceftazidime 3/30>>>7/2 vanc 6/30>>>7/1 levaquin 7/2>>>   SUBJECTIVE:  Went into A fib + RVR overnight, started dilt gtt Has had periods of anxiety and apparent UA obstruction subcut air has expanded significantly  VITAL SIGNS: Temp:  [97.5 F (36.4 C)-98.6 F (37 C)] 98.4 F (36.9 C) (07/04 0400) Pulse Rate:  [51-213] 121 (07/04 0700) Resp:  [8-26] 16 (07/04 0725) BP: (111-194)/(74-120) 194/120 mmHg (07/04 0700) SpO2:  [92 %-100 %] 99 % (07/04 0725) 3 liters [place]  INTAKE / OUTPUT: Intake/Output     07/03 0701 - 07/04 0700 07/04 0701 - 07/05 0700   I.V. (mL/kg) 1079.3 (13.1)    Total Intake(mL/kg) 1079.3 (13.1)    Urine (mL/kg/hr) 3425 (1.7)    Chest Tube 20 (0)    Total Output 3445     Net -2365.7            PHYSICAL EXAMINATION: General:  Middle aged male in mild distress Neuro:  Awake, nonfocal, follow commands. MAE, Oriented X 3 HEENT:  Atraumatic, PERRLA,  neck supple. No masses, some mild stridor on exp Cardiovascular: Regular rythym, tachycardic, no murmurs Lungs:  Diminished on left, expiratory wheezes throughout. Marked [place] air w/ crepitus over entire thorax and spreading to patients neck.  Abdomen:  Distended, firm, bs present. Musculoskeletal:  No edema Skin:  No rash, intact  LABS:  Recent Labs Lab 07/12/12 0453 07/13/12 0500 07/14/12 0500  NA 138 136 140  K 4.6 3.7 3.9  CL 103 97 99  CO2 31 33* 34*  BUN 11 8 10   CREATININE 1.05 0.89 0.93  GLUCOSE 106* 103* 110*    Recent Labs Lab 07/12/12 0453 07/13/12 0500 07/14/12 0500  HGB 10.2* 10.6* 11.6*  HCT 33.3* 34.4* 38.2*  WBC 10.4 6.8 8.7  PLT 232 265 346    Recent Labs Lab 07/10/12 1446 07/10/12 1728 07/11/12 0530 07/12/12 0452 07/12/12 0453 07/13/12 0500 07/14/12 0500  PROCALCITON  --  0.96 1.85 1.13  --   --   --   WBC 12.0*  --  9.4  --  10.4 6.8 8.7    Recent Labs Lab 07/13/12 0421 07/13/12 0736 07/13/12 1126 07/13/12 1713 07/13/12 2146  GLUCAP 97 77 120* 108* 121*    CXR: 7/3: Diffuse subq emphysema. No definite pneumothorax visualized. Small amount of pneumomediastinum per radiology .   ASSESSMENT / PLAN:  Acute on Chronic Respiratory failure - now improved, still c/o cough and difficulty w/ mucous clearance  Left tension PTX - still has airleak, although improved. Redressed during my exam  with vasaline gauze applied. No obvious PTX today on CXR Possible CAP vs purulent bronchitis - PCT trending down, leukocytosis improved  Hx of sarcoidosis - known rt bleb P:   Keep chest tube to 20 cm suction. In dressing changed, use Vaseline gauze. Appreciate Dr Sharee Pimple help. May be a role for sgy although agree that his underlying lung disease complicates this, could actually make worse. Will discuss the options, repair vs watch and wait  Maintain O2 sat greater than 92% Pulmonary toilet Flutter Valve Albuterol scheduled and PRN Home ICS Wean pred  to 20mg  qd Daily PCXR while CT in place ABX as above Continue PCA with EtCO2 monitoring Pt consult Avoid NIPPV if possible given air leak  A Fib + RVR P: Continue dilt gtt for now, consider increase PO b-blockade if persists Check trop x 3  HX of HTN P:  Tele monitoring Norvasc ordered per home dose Labetalol prn  Anemia - patient baseline/ suspect anemia of chronic disease.  P:  Sub Q heparin for DVT Px Follow CBC and Coags  Prednisone dependent - s/p recent taper P:   Wean pred, his wheeze appears to be UA in nature Follow CBG SSI PRN  Constipation - last BM 6/30. Likely related to immobility and narcotic administration. Now with some abdominal distension P: D/c colace  Add Senna- S BID Add Miralax Doculax suppository PRN  TODAY'S SUMMARY: Hx of sarcoidosis. Presented with ARF with likely spontaneous tension PTX as cause. Still has airleak on suction, will call thoracic to eval.  Will keep on SDU status.    I have personally obtained a history, examined the patient, evaluated laboratory and imaging results, formulated the assessment and plan and placed orders.   Levy Pupa, MD, PhD 07/14/2012, 7:51 AM [facility] [phone] or if no answer [phone]

## 2012-07-15 ENCOUNTER — Inpatient Hospital Stay (HOSPITAL_COMMUNITY): Payer: 59

## 2012-07-15 DIAGNOSIS — J40 Bronchitis, not specified as acute or chronic: Secondary | ICD-10-CM

## 2012-07-15 MED ORDER — DILTIAZEM HCL ER 60 MG PO CP12
120.0000 mg | ORAL_CAPSULE | Freq: Two times a day (BID) | ORAL | Status: DC
  Administered 2012-07-15 – 2012-07-23 (×17): 120 mg via ORAL
  Filled 2012-07-15 (×23): qty 2

## 2012-07-15 MED ORDER — LEVALBUTEROL HCL 0.63 MG/3ML IN NEBU
0.6300 mg | INHALATION_SOLUTION | RESPIRATORY_TRACT | Status: DC | PRN
  Administered 2012-07-16 – 2012-08-02 (×9): 0.63 mg via RESPIRATORY_TRACT
  Filled 2012-07-15 (×6): qty 3

## 2012-07-15 MED ORDER — HYDRALAZINE HCL 20 MG/ML IJ SOLN
10.0000 mg | INTRAMUSCULAR | Status: DC | PRN
  Administered 2012-07-16: 40 mg via INTRAVENOUS
  Filled 2012-07-15: qty 2

## 2012-07-15 MED ORDER — HYDROMORPHONE HCL PF 1 MG/ML IJ SOLN
0.5000 mg | INTRAMUSCULAR | Status: DC | PRN
  Administered 2012-07-15: 0.5 mg via INTRAVENOUS
  Administered 2012-07-15 – 2012-07-17 (×5): 1 mg via INTRAVENOUS
  Filled 2012-07-15 (×6): qty 1

## 2012-07-15 MED ORDER — METOPROLOL TARTRATE 1 MG/ML IV SOLN
2.5000 mg | INTRAVENOUS | Status: DC | PRN
  Filled 2012-07-15: qty 5

## 2012-07-15 MED ORDER — ENOXAPARIN SODIUM 40 MG/0.4ML ~~LOC~~ SOLN
40.0000 mg | SUBCUTANEOUS | Status: DC
  Administered 2012-07-15 – 2012-08-04 (×20): 40 mg via SUBCUTANEOUS
  Filled 2012-07-15 (×21): qty 0.4

## 2012-07-15 NOTE — Progress Notes (Addendum)
PULMONARY  / CRITICAL CARE MEDICINE  Name: [name] MRN: [id number] DOB: [birth year]    ADMISSION DATE:  07/10/2012 CONSULTATION DATE:  07/10/12  REFERRING MD :  [name] PRIMARY SERVICE: PCCM  CHIEF COMPLAINT:  Acute resp failure, tension ptx in setting advanced sarcoidosis  BRIEF PATIENT DESCRIPTION: 47 y/o male with PMH of sarcoidosis, OSA, HTN, and home O2 use who came into [facility] with ARF r/t tension pneumothorax.  Patient was intubated and chest tube placed by [name]>  SIGNIFICANT EVENTS / STUDIES:  6/17 ct chest: 10.1 x 7.8 cm thin-walled cavitary/bullous lesion in the posterior right upper lobe, unchanged. Confluent perihilar opacities with traction bronchiectasis and architectural distortion, related to known sarcoidosis. Associated peribronchovascular nodularity bilaterally. 6/30 -  tension PTX, resp failure, chest tube placed, intubated. 7/1 -  Extubated  LINES / TUBES: 6/30 ETT >> 7/1 6/30 L chest tube >>  6/30 L subclavian CVC >>   CULTURES: 6/30 resp>>> abundant staph, abundant pseudomonas 6/30 BC >> NEG  ANTIBIOTICS: Ceftazidime 3/30>>>7/2 vanc 6/30>>>7/1 levaquin 7/2>>>  SUBJECTIVE:  No complaints. No distress  VITAL SIGNS: Temp:  [98.1 F (36.7 C)-99.5 F (37.5 C)] 98.1 F (36.7 C) (07/05 1200) Pulse Rate:  [49-96] 80 (07/05 1630) Resp:  [9-23] 16 (07/05 1630) BP: (117-170)/(91-145) 143/108 mmHg (07/05 1630) SpO2:  [95 %-100 %] 100 % (07/05 1630) FiO2 (%):  [28 %] 28 % (07/05 0157)   INTAKE / OUTPUT: Intake/Output     07/04 0701 - 07/05 0700 07/05 0701 - 07/06 0700   P.O.  480   I.V. (mL/kg) 1409.2 (17.1) 78.8 (1)   Total Intake(mL/kg) 1409.2 (17.1) 558.8 (6.8)   Urine (mL/kg/hr) 1920 (1) 2000 (2.4)   Chest Tube 40 (0)    Total Output 1960 2000   Net -550.8 -1441.2          PHYSICAL EXAMINATION: General: NAD Neuro: no focal deficits HEENT: WNL, cushingoid facies Cardiovascular: RRR s M Lungs:  Crepitus across chest, no wheezes Abdomen: soft,  NT, NABS Ext: warm, No edema  LABS:  Recent Labs Lab 07/12/12 0453 07/13/12 0500 07/14/12 0500  NA 138 136 140  K 4.6 3.7 3.9  CL 103 97 99  CO2 31 33* 34*  BUN 11 8 10   CREATININE 1.05 0.89 0.93  GLUCOSE 106* 103* 110*    Recent Labs Lab 07/12/12 0453 07/13/12 0500 07/14/12 0500  HGB 10.2* 10.6* 11.6*  HCT 33.3* 34.4* 38.2*  WBC 10.4 6.8 8.7  PLT 232 265 346    Recent Labs Lab 07/10/12 1446 07/10/12 1728 07/11/12 0530 07/12/12 0452 07/12/12 0453 07/13/12 0500 07/14/12 0500  PROCALCITON  --  0.96 1.85 1.13  --   --   --   WBC 12.0*  --  9.4  --  10.4 6.8 8.7    Recent Labs Lab 07/14/12 0741 07/14/12 1145 07/14/12 1646 07/14/12 2219 07/15/12 0800  GLUCAP 92 136* 132* 130* 138*   Chest tube:  1/7 air leak CXR: NSC .   ASSESSMENT / PLAN:  Acute on Chronic Respiratory failure Left PTX c/b extensive subq emphysema -  CAP vs purulent bronchitis Pulm sarcoidosis   P:   Cont supplemental O2 Cont BDs TCTS following Micro and abx as above  HTN AF > NSR P:  Change dilt to PO PRN hydralazine  Anemia   P:  CBC intermittently  Prednisone dependent  P:   Cont current dose of steroids  Constipation -  P:  Cont bowel regimen     I  have personally obtained a history, examined the patient, evaluated laboratory and imaging results, formulated the assessment and plan and placed orders.   Billy Fischer, MD ; [facility] [phone].  After 5:30 PM or weekends, call [phone]

## 2012-07-16 ENCOUNTER — Inpatient Hospital Stay (HOSPITAL_COMMUNITY): Payer: 59

## 2012-07-16 DIAGNOSIS — I10 Essential (primary) hypertension: Secondary | ICD-10-CM

## 2012-07-16 LAB — GLUCOSE, CAPILLARY
Glucose-Capillary: 127 mg/dL — ABNORMAL HIGH (ref 70–99)
Glucose-Capillary: 147 mg/dL — ABNORMAL HIGH (ref 70–99)

## 2012-07-16 LAB — CULTURE, BLOOD (ROUTINE X 2): Culture: NO GROWTH

## 2012-07-16 MED ORDER — DIPHENHYDRAMINE HCL 25 MG PO CAPS
25.0000 mg | ORAL_CAPSULE | Freq: Four times a day (QID) | ORAL | Status: DC | PRN
  Administered 2012-07-16 – 2012-07-27 (×8): 25 mg via ORAL
  Filled 2012-07-16 (×8): qty 1

## 2012-07-16 MED ORDER — DM-GUAIFENESIN ER 30-600 MG PO TB12
1.0000 | ORAL_TABLET | Freq: Two times a day (BID) | ORAL | Status: DC
  Administered 2012-07-16 – 2012-07-18 (×5): 1 via ORAL
  Filled 2012-07-16 (×10): qty 1

## 2012-07-16 MED ORDER — PREDNISONE 10 MG PO TABS
10.0000 mg | ORAL_TABLET | Freq: Every day | ORAL | Status: DC
  Filled 2012-07-16 (×2): qty 1

## 2012-07-16 NOTE — Progress Notes (Signed)
PT D/C note:  Spoke with RN who states that chest tube has air leak and he is to transfer to [facility] tomorrow for VATS placement.  Will sign off and complete PT order at this time.    Thanks,  Harriet Butte, PT

## 2012-07-16 NOTE — Progress Notes (Signed)
PULMONARY  / CRITICAL CARE MEDICINE  Name: [name] MRN: [id number] DOB: Feb 13, 1965    ADMISSION DATE:  07/10/2012 CONSULTATION DATE:  07/10/12  REFERRING MD :  [name] PRIMARY SERVICE: PCCM  CHIEF COMPLAINT:  Acute resp failure, tension ptx in setting advanced sarcoidosis  BRIEF PATIENT DESCRIPTION: 47 y/o male with PMH of sarcoidosis, OSA, HTN, and home O2 use who came into [facility] with ARF r/t tension pneumothorax.  Patient was intubated and chest tube placed by [name]>  SIGNIFICANT EVENTS / STUDIES:  6/17 ct chest: 10.1 x 7.8 cm thin-walled cavitary/bullous lesion in the posterior right upper lobe, unchanged. Confluent perihilar opacities with traction bronchiectasis and architectural distortion, related to known sarcoidosis. Associated peribronchovascular nodularity bilaterally. 6/30 -  tension PTX, resp failure, chest tube placed, intubated. 7/1 -  Extubated  LINES / TUBES: 6/30 ETT >> 7/1 6/30 L chest tube >>  6/30 L subclavian CVC >>   CULTURES: 6/30 resp>>> abundant staph, abundant pseudomonas 6/30 BC >> NEG  ANTIBIOTICS: Ceftazidime 3/30>>>7/2 vanc 6/30>>>7/1 levaquin 7/2>>>  SUBJECTIVE:  No new complaints. No distress  VITAL SIGNS: Temp:  [97.9 F (36.6 C)-98.8 F (37.1 C)] 98.8 F (37.1 C) (07/06 1200) Pulse Rate:  [55-108] 81 (07/06 1300) Resp:  [10-27] 13 (07/06 1300) BP: (108-203)/(46-126) 112/46 mmHg (07/06 1300) SpO2:  [96 %-100 %] 97 % (07/06 1300)   INTAKE / OUTPUT: Intake/Output     07/05 0701 - 07/06 0700 07/06 0701 - 07/07 0700   P.O. 720 180   I.V. (mL/kg) 198.8 (2.4) 140 (1.7)   Total Intake(mL/kg) 918.8 (11.2) 320 (3.9)   Urine (mL/kg/hr) 2725 (1.4) 300 (0.5)   Chest Tube 30 (0) 20 (0)   Total Output 2755 320   Net -1836.2 0          PHYSICAL EXAMINATION: General: NAD Neuro: no focal deficits HEENT: WNL, cushingoid facies Cardiovascular: RRR s M Lungs:  Crepitus across chest, no wheezes Abdomen: soft, NT, NABS Ext: warm, No  edema  LABS:  Recent Labs Lab 07/12/12 0453 07/13/12 0500 07/14/12 0500  NA 138 136 140  K 4.6 3.7 3.9  CL 103 97 99  CO2 31 33* 34*  BUN 11 8 10   CREATININE 1.05 0.89 0.93  GLUCOSE 106* 103* 110*    Recent Labs Lab 07/12/12 0453 07/13/12 0500 07/14/12 0500  HGB 10.2* 10.6* 11.6*  HCT 33.3* 34.4* 38.2*  WBC 10.4 6.8 8.7  PLT 232 265 346    Recent Labs Lab 07/10/12 1446 07/10/12 1728 07/11/12 0530 07/12/12 0452 07/12/12 0453 07/13/12 0500 07/14/12 0500  PROCALCITON  --  0.96 1.85 1.13  --   --   --   WBC 12.0*  --  9.4  --  10.4 6.8 8.7    Recent Labs Lab 07/14/12 2219 07/15/12 0800 07/15/12 1659 07/16/12 0021 07/16/12 0733  GLUCAP 130* 138* 154* 127* 147*   Chest tube:  1/7 air leak CXR: NSC L ptx. Chest tube has migrated out such that side port is in soft tissue .   ASSESSMENT / PLAN:  Acute on Chronic Respiratory failure Left PTX c/b extensive subq emphysema -  CAP vs purulent bronchitis Pulm sarcoidosis   P:   Cont supplemental O2 Cont BDs TCTS following - plan stapling of bleb and pleurodesis 7/07 Micro and abx as above  HTN AF > NSR P:  Cont PO dilt and metoprolol Cont PRN hydralazine  Anemia   P:  Monitor CBC intermittently  Prednisone dependent  P:  Change pred to 10 mg daily  Constipation -  P:  Cont bowel regimen     I have personally obtained a history, examined the patient, evaluated laboratory and imaging results, formulated the assessment and plan and placed orders.   Billy Fischer, MD ; [facility] [phone].  After 5:30 PM or weekends, call [phone]

## 2012-07-16 NOTE — Progress Notes (Signed)
Procedure(s) (LRB): VIDEO ASSISTED THORACOSCOPY (VATS)/BLOOD STABLING (N/A) Subjective: Breathing is ok  Objective: Vital signs in last 24 hours: Temp:  [97.9 F (36.6 C)-98.4 F (36.9 C)] 97.9 F (36.6 C) (07/06 0800) Pulse Rate:  [55-108] 81 (07/06 1100) Cardiac Rhythm:  [-] Normal sinus rhythm (07/06 0800) Resp:  [10-27] 12 (07/06 1100) BP: (108-203)/(70-126) 130/74 mmHg (07/06 1100) SpO2:  [96 %-100 %] 98 % (07/06 1100)  Hemodynamic parameters for last 24 hours:    Intake/Output from previous day: 07/05 0701 - 07/06 0700 In: 918.8 [P.O.:720; I.V.:198.8] Out: 2755 [Urine:2725; Chest Tube:30] Intake/Output this shift: Total I/O In: 240 [P.O.:180; I.V.:60] Out: 320 [Urine:300; Chest Tube:20]  General appearance: alert and cooperative Heart: regular rate and rhythm, S1, S2 normal, no murmur, click, rub or gallop Lungs: clear to auscultation bilaterally The subcutaneous emphysema in chest wall and neck is stable. Chest tube has small persistent air leak that increases with coughing.  Lab Results:  Recent Labs  07/14/12 0500  WBC 8.7  HGB 11.6*  HCT 38.2*  PLT 346   BMET:  Recent Labs  07/14/12 0500  NA 140  K 3.9  CL 99  CO2 34*  GLUCOSE 110*  BUN 10  CREATININE 0.93  CALCIUM 9.7    PT/INR: No results found for this basename: LABPROT, INR,  in the last 72 hours ABG    Component Value Date/Time   PHART 7.272* 07/11/2012 0406   HCO3 22.0 07/11/2012 0406   TCO2 20.6 07/11/2012 0406   ACIDBASEDEF 4.5* 07/11/2012 0406   O2SAT 96.5 07/11/2012 0406   CBG (last 3)   Recent Labs  07/15/12 1659 07/16/12 0021 07/16/12 0733  GLUCAP 154* 127* 147*   *RADIOLOGY REPORT*  Clinical Data: Chest tube, follow up pneumothorax  PORTABLE CHEST - 1 VIEW  Comparison: Prior chest x-ray 07/15/2012  Findings: The left thoracostomy tube continues to retract, the  proximal side hole is now within the soft tissues of the thoracic  wall. The tip of the tube remains  intrathoracic. Stable position  of left subclavian approach central venous catheter with the tip at  the confluence of the left innominate vein and superior vena cava.  Massive subcutaneous emphysema remains present and continues to  obscure evaluation of the underlying lung parenchyma. The small  left pneumothorax and mediastinum appear grossly unchanged. Stable  severe background emphysema with right mid and upper lung bullous  change.  IMPRESSION:  1. The left-sided thoracostomy tube continues to retract, the  proximal side hole is now in the soft tissues of the chest wall.  The tip of the thoracostomy tube remains intrathoracic.  2. Grossly stable small left pneumothorax and pneumomediastinum  given limitations of extensive overlying subcutaneous emphysema.  These results were called by telephone on 07/15/2012 at 08:00 a.m.  to the patient's nurse, Judeth Cornfield, who verbally acknowledged these  results.  Original Report Authenticated By: Malachy Moan, M.D.   Assessment/Plan:  He has a persistent air leak and the chest tube is backing out. I think it is best to proceed with VATS and try to staple the air leak. I suspect it is due to one of the blebs at apex or medially. I would plan to do mechanical pleurodesis. I discussed the procedure with the patient and family including possible need for thoracotomy, benefits and risks including but not limited to persistent air leak postop, infection, respiratory failure and recurrent pneumothorax. He understands and agrees to proceed. Will transfer to [facility] in am and do tomorrow afternoon.  LOS: 6 days    Sheriann Newmann K 07/16/2012

## 2012-07-17 ENCOUNTER — Inpatient Hospital Stay (HOSPITAL_COMMUNITY): Payer: 59 | Admitting: Certified Registered"

## 2012-07-17 ENCOUNTER — Inpatient Hospital Stay (HOSPITAL_COMMUNITY): Payer: 59

## 2012-07-17 ENCOUNTER — Encounter (HOSPITAL_COMMUNITY)
Admission: EM | Disposition: A | Discharge status: Home / Self Care | Payer: Self-pay | Source: Home / Self Care | Attending: Internal Medicine

## 2012-07-17 ENCOUNTER — Encounter (HOSPITAL_COMMUNITY): Payer: Self-pay | Admitting: Certified Registered"

## 2012-07-17 DIAGNOSIS — J930 Spontaneous tension pneumothorax: Secondary | ICD-10-CM

## 2012-07-17 HISTORY — PX: VIDEO ASSISTED THORACOSCOPY (VATS)/THOROCOTOMY: SHX6173

## 2012-07-17 LAB — CBC
HCT: 35.1 % — ABNORMAL LOW (ref 39.0–52.0)
MCH: 24.8 pg — ABNORMAL LOW (ref 26.0–34.0)
MCHC: 30.2 g/dL (ref 30.0–36.0)
MCV: 82 fL (ref 78.0–100.0)
RDW: 14.4 % (ref 11.5–15.5)

## 2012-07-17 LAB — BASIC METABOLIC PANEL
BUN: 15 mg/dL (ref 6–23)
CO2: 32 mEq/L (ref 19–32)
Calcium: 9.2 mg/dL (ref 8.4–10.5)
Chloride: 102 mEq/L (ref 96–112)
Creatinine, Ser: 1.09 mg/dL (ref 0.50–1.35)

## 2012-07-17 LAB — GLUCOSE, CAPILLARY
Glucose-Capillary: 90 mg/dL (ref 70–99)
Glucose-Capillary: 118 mg/dL — ABNORMAL HIGH (ref 70–99)

## 2012-07-17 SURGERY — VIDEO ASSISTED THORACOSCOPY (VATS)/THOROCOTOMY
Anesthesia: General | Site: Chest | Laterality: Left | Wound class: Contaminated

## 2012-07-17 MED ORDER — VANCOMYCIN HCL IN DEXTROSE 1-5 GM/200ML-% IV SOLN
1000.0000 mg | Freq: Two times a day (BID) | INTRAVENOUS | Status: AC
  Administered 2012-07-17: 1000 mg via INTRAVENOUS
  Filled 2012-07-17: qty 200

## 2012-07-17 MED ORDER — CEFUROXIME SODIUM 750 MG IJ SOLR
INTRAMUSCULAR | Status: AC
  Filled 2012-07-17: qty 750

## 2012-07-17 MED ORDER — SUCCINYLCHOLINE CHLORIDE 20 MG/ML IJ SOLN
INTRAMUSCULAR | Status: DC | PRN
  Administered 2012-07-17: 140 mg via INTRAVENOUS

## 2012-07-17 MED ORDER — DEXTROSE-NACL 5-0.45 % IV SOLN
INTRAVENOUS | Status: DC
  Administered 2012-07-17: 18:00:00 via INTRAVENOUS
  Administered 2012-07-18: 20 mL/h via INTRAVENOUS

## 2012-07-17 MED ORDER — ONDANSETRON HCL 4 MG/2ML IJ SOLN
4.0000 mg | Freq: Four times a day (QID) | INTRAMUSCULAR | Status: DC | PRN
  Administered 2012-07-18 – 2012-08-02 (×7): 4 mg via INTRAVENOUS
  Filled 2012-07-17 (×7): qty 2

## 2012-07-17 MED ORDER — GUAIFENESIN ER 600 MG PO TB12
1200.0000 mg | ORAL_TABLET | Freq: Two times a day (BID) | ORAL | Status: DC | PRN
  Administered 2012-07-18: 1200 mg via ORAL
  Filled 2012-07-17: qty 2

## 2012-07-17 MED ORDER — GLYCOPYRROLATE 0.2 MG/ML IJ SOLN
INTRAMUSCULAR | Status: DC | PRN
  Administered 2012-07-17: 0.6 mg via INTRAVENOUS

## 2012-07-17 MED ORDER — HYDROCORTISONE SOD SUCCINATE 100 MG IJ SOLR
50.0000 mg | Freq: Four times a day (QID) | INTRAMUSCULAR | Status: DC
  Administered 2012-07-17 – 2012-07-18 (×4): 50 mg via INTRAVENOUS
  Filled 2012-07-17 (×8): qty 1

## 2012-07-17 MED ORDER — ROCURONIUM BROMIDE 100 MG/10ML IV SOLN
INTRAVENOUS | Status: DC | PRN
  Administered 2012-07-17: 10 mg via INTRAVENOUS
  Administered 2012-07-17: 30 mg via INTRAVENOUS
  Administered 2012-07-17: 10 mg via INTRAVENOUS

## 2012-07-17 MED ORDER — PROPOFOL 10 MG/ML IV BOLUS
INTRAVENOUS | Status: DC | PRN
  Administered 2012-07-17: 160 mg via INTRAVENOUS

## 2012-07-17 MED ORDER — NALOXONE HCL 0.4 MG/ML IJ SOLN: 0.4000 mg | INTRAMUSCULAR | Status: DC | PRN

## 2012-07-17 MED ORDER — FENTANYL CITRATE 0.05 MG/ML IJ SOLN
INTRAMUSCULAR | Status: DC | PRN
  Administered 2012-07-17: 50 ug via INTRAVENOUS
  Administered 2012-07-17: 100 ug via INTRAVENOUS
  Administered 2012-07-17 (×2): 50 ug via INTRAVENOUS

## 2012-07-17 MED ORDER — SODIUM CHLORIDE 0.9 % IJ SOLN: 9.0000 mL | INTRAMUSCULAR | Status: DC | PRN

## 2012-07-17 MED ORDER — DEXTROSE 5 % IV SOLN
1.5000 g | Freq: Two times a day (BID) | INTRAVENOUS | Status: AC
  Administered 2012-07-18 (×2): 1.5 g via INTRAVENOUS
  Filled 2012-07-17 (×2): qty 1.5

## 2012-07-17 MED ORDER — POTASSIUM CHLORIDE 10 MEQ/50ML IV SOLN
10.0000 meq | Freq: Every day | INTRAVENOUS | Status: DC | PRN
  Administered 2012-07-19 (×3): 10 meq via INTRAVENOUS
  Filled 2012-07-17 (×4): qty 50

## 2012-07-17 MED ORDER — ONDANSETRON HCL 4 MG/2ML IJ SOLN: 4.0000 mg | Freq: Four times a day (QID) | INTRAMUSCULAR | Status: DC | PRN

## 2012-07-17 MED ORDER — MIDAZOLAM HCL 5 MG/5ML IJ SOLN
INTRAMUSCULAR | Status: DC | PRN
  Administered 2012-07-17: 2 mg via INTRAVENOUS

## 2012-07-17 MED ORDER — BISACODYL 5 MG PO TBEC
10.0000 mg | DELAYED_RELEASE_TABLET | Freq: Every day | ORAL | Status: DC
  Administered 2012-07-19 – 2012-08-04 (×11): 10 mg via ORAL
  Filled 2012-07-17: qty 2
  Filled 2012-07-17: qty 1
  Filled 2012-07-17 (×6): qty 2
  Filled 2012-07-17: qty 1
  Filled 2012-07-17 (×2): qty 2

## 2012-07-17 MED ORDER — LACTATED RINGERS IV SOLN
INTRAVENOUS | Status: DC | PRN
  Administered 2012-07-17 (×2): via INTRAVENOUS

## 2012-07-17 MED ORDER — NEOSTIGMINE METHYLSULFATE 1 MG/ML IJ SOLN
INTRAMUSCULAR | Status: DC | PRN
  Administered 2012-07-17: 4 mg via INTRAVENOUS

## 2012-07-17 MED ORDER — ONDANSETRON HCL 4 MG/2ML IJ SOLN
INTRAMUSCULAR | Status: DC | PRN
  Administered 2012-07-17: 4 mg via INTRAVENOUS

## 2012-07-17 MED ORDER — OXYCODONE HCL 5 MG PO TABS: 5.0000 mg | ORAL_TABLET | Freq: Once | ORAL | Status: DC | PRN

## 2012-07-17 MED ORDER — OXYCODONE HCL 5 MG/5ML PO SOLN: 5.0000 mg | Freq: Once | ORAL | Status: DC | PRN

## 2012-07-17 MED ORDER — FENTANYL 10 MCG/ML IV SOLN
INTRAVENOUS | Status: DC
  Administered 2012-07-17: 255 ug via INTRAVENOUS
  Administered 2012-07-17: 23:00:00 via INTRAVENOUS
  Administered 2012-07-17: 15 ug via INTRAVENOUS
  Administered 2012-07-17: 30 ug via INTRAVENOUS
  Administered 2012-07-18: 75 ug via INTRAVENOUS
  Administered 2012-07-18: 147.7 ug via INTRAVENOUS
  Administered 2012-07-18: 90 ug via INTRAVENOUS
  Administered 2012-07-18: 45 ug via INTRAVENOUS
  Administered 2012-07-18: 195 ug via INTRAVENOUS
  Administered 2012-07-18: 150 ug via INTRAVENOUS
  Administered 2012-07-19: 15 ug/h via INTRAVENOUS
  Administered 2012-07-19: 30 ug via INTRAVENOUS
  Administered 2012-07-19: 75 ug via INTRAVENOUS
  Administered 2012-07-19: 23:00:00 via INTRAVENOUS
  Administered 2012-07-19: 105 ug via INTRAVENOUS
  Administered 2012-07-19: 90 ug via INTRAVENOUS
  Administered 2012-07-19: 15 ug via INTRAVENOUS
  Administered 2012-07-20: 75 ug via INTRAVENOUS
  Administered 2012-07-20: 90 ug via INTRAVENOUS
  Administered 2012-07-20: 60 ug via INTRAVENOUS
  Administered 2012-07-20: 45 ug via INTRAVENOUS
  Administered 2012-07-20: 60 ug via INTRAVENOUS
  Administered 2012-07-20: 90 ug via INTRAVENOUS
  Administered 2012-07-20: 105 ug via INTRAVENOUS
  Administered 2012-07-21 (×2): 60 ug via INTRAVENOUS
  Administered 2012-07-21: 105 ug via INTRAVENOUS
  Administered 2012-07-21: 03:00:00 via INTRAVENOUS
  Administered 2012-07-21: 30 ug via INTRAVENOUS
  Administered 2012-07-21: 105 ug via INTRAVENOUS
  Administered 2012-07-22: 30 ug via INTRAVENOUS
  Administered 2012-07-22: 150 ug via INTRAVENOUS
  Administered 2012-07-22: 115.9 ug via INTRAVENOUS
  Administered 2012-07-22: 180 ug via INTRAVENOUS
  Administered 2012-07-22: 90 ug via INTRAVENOUS
  Administered 2012-07-22: 105 ug via INTRAVENOUS
  Administered 2012-07-22: 08:00:00 via INTRAVENOUS
  Administered 2012-07-23: 105 ug via INTRAVENOUS
  Administered 2012-07-23: via INTRAVENOUS
  Administered 2012-07-23: 120 ug via INTRAVENOUS
  Administered 2012-07-23: 16:00:00 via INTRAVENOUS
  Administered 2012-07-23: 42.79 ug via INTRAVENOUS
  Administered 2012-07-23: 75 ug via INTRAVENOUS
  Administered 2012-07-24: 27 ug via INTRAVENOUS
  Filled 2012-07-17 (×10): qty 50

## 2012-07-17 MED ORDER — DEXTROSE 5 % IV SOLN
1.5000 g | INTRAVENOUS | Status: DC | PRN
  Administered 2012-07-17: 1.5 g via INTRAVENOUS

## 2012-07-17 MED ORDER — HYDROMORPHONE HCL PF 1 MG/ML IJ SOLN
INTRAMUSCULAR | Status: AC
  Administered 2012-07-17: 0.5 mg via INTRAVENOUS
  Filled 2012-07-17: qty 1

## 2012-07-17 MED ORDER — ACETAMINOPHEN 10 MG/ML IV SOLN
INTRAVENOUS | Status: AC
  Administered 2012-07-17: 1000 mg via INTRAVENOUS
  Filled 2012-07-17: qty 100

## 2012-07-17 MED ORDER — ACETAMINOPHEN 10 MG/ML IV SOLN
1000.0000 mg | Freq: Four times a day (QID) | INTRAVENOUS | Status: AC
  Administered 2012-07-17 – 2012-07-18 (×3): 1000 mg via INTRAVENOUS
  Filled 2012-07-17 (×3): qty 100

## 2012-07-17 MED ORDER — HYDROMORPHONE HCL PF 1 MG/ML IJ SOLN
0.2500 mg | INTRAMUSCULAR | Status: DC | PRN
  Administered 2012-07-17 (×2): 0.5 mg via INTRAVENOUS

## 2012-07-17 SURGICAL SUPPLY — 57 items
APPLICATOR TIP EXT COSEAL (VASCULAR PRODUCTS) IMPLANT
CANISTER SUCTION 2500CC (MISCELLANEOUS) ×2 IMPLANT
CATH KIT ON Q 5IN SLV (PAIN MANAGEMENT) IMPLANT
CATH THORACIC 28FR (CATHETERS) IMPLANT
CATH THORACIC 36FR (CATHETERS) IMPLANT
CATH THORACIC 36FR RT ANG (CATHETERS) IMPLANT
CLIP TI MEDIUM 6 (CLIP) ×2 IMPLANT
CLOTH BEACON ORANGE TIMEOUT ST (SAFETY) ×2 IMPLANT
CONT SPEC 4OZ CLIKSEAL STRL BL (MISCELLANEOUS) ×4 IMPLANT
COVER SURGICAL LIGHT HANDLE (MISCELLANEOUS) ×2 IMPLANT
CUTTER ENDO LINEAR 45M (STAPLE) ×2 IMPLANT
DRAPE LAPAROSCOPIC ABDOMINAL (DRAPES) ×2 IMPLANT
DRAPE WARM FLUID 44X44 (DRAPE) ×4 IMPLANT
ELECT REM PT RETURN 9FT ADLT (ELECTROSURGICAL) ×2
ELECTRODE REM PT RTRN 9FT ADLT (ELECTROSURGICAL) ×1 IMPLANT
GLOVE EUDERMIC 7 POWDERFREE (GLOVE) ×4 IMPLANT
GOWN PREVENTION PLUS XLARGE (GOWN DISPOSABLE) ×2 IMPLANT
GOWN STRL NON-REIN LRG LVL3 (GOWN DISPOSABLE) ×4 IMPLANT
HEMOSTAT SURGICEL 2X14 (HEMOSTASIS) IMPLANT
KIT BASIN OR (CUSTOM PROCEDURE TRAY) ×2 IMPLANT
KIT ROOM TURNOVER OR (KITS) ×2 IMPLANT
KIT SUCTION CATH 14FR (SUCTIONS) ×2 IMPLANT
NS IRRIG 1000ML POUR BTL (IV SOLUTION) ×4 IMPLANT
PACK CHEST (CUSTOM PROCEDURE TRAY) ×2 IMPLANT
PAD ARMBOARD 7.5X6 YLW CONV (MISCELLANEOUS) ×4 IMPLANT
SEALANT SURG COSEAL 4ML (VASCULAR PRODUCTS) IMPLANT
SEALANT SURG COSEAL 8ML (VASCULAR PRODUCTS) IMPLANT
SOLUTION ANTI FOG 6CC (MISCELLANEOUS) ×2 IMPLANT
SPONGE GAUZE 4X4 12PLY (GAUZE/BANDAGES/DRESSINGS) ×2 IMPLANT
STAPLER ENDO NO KNIFE (STAPLE) ×2 IMPLANT
SUT BONE WAX W31G (SUTURE) ×2 IMPLANT
SUT ETHILON 3 0 FSL (SUTURE) ×2 IMPLANT
SUT PROLENE 3 0 SH DA (SUTURE) IMPLANT
SUT PROLENE 4 0 RB 1 (SUTURE)
SUT PROLENE 4-0 RB1 .5 CRCL 36 (SUTURE) IMPLANT
SUT SILK  1 MH (SUTURE) ×2
SUT SILK 1 MH (SUTURE) ×2 IMPLANT
SUT SILK 2 0 SH (SUTURE) IMPLANT
SUT SILK 2 0SH CR/8 30 (SUTURE) IMPLANT
SUT VIC AB 1 CTX 18 (SUTURE) ×2 IMPLANT
SUT VIC AB 1 CTX 36 (SUTURE)
SUT VIC AB 1 CTX36XBRD ANBCTR (SUTURE) IMPLANT
SUT VIC AB 2-0 CTX 36 (SUTURE) ×4 IMPLANT
SUT VIC AB 2-0 UR6 27 (SUTURE) ×2 IMPLANT
SUT VIC AB 3-0 MH 27 (SUTURE) IMPLANT
SUT VIC AB 3-0 X1 27 (SUTURE) ×2 IMPLANT
SUT VICRYL 2 TP 1 (SUTURE) IMPLANT
SWAB COLLECTION DEVICE MRSA (MISCELLANEOUS) ×2 IMPLANT
SYSTEM SAHARA CHEST DRAIN RE-I (WOUND CARE) ×2 IMPLANT
TAPE CLOTH 4X10 WHT NS (GAUZE/BANDAGES/DRESSINGS) ×2 IMPLANT
TAPE CLOTH SURG 4X10 WHT LF (GAUZE/BANDAGES/DRESSINGS) ×2 IMPLANT
TIP APPLICATOR SPRAY EXTEND 16 (VASCULAR PRODUCTS) IMPLANT
TOWEL OR 17X24 6PK STRL BLUE (TOWEL DISPOSABLE) ×4 IMPLANT
TOWEL OR 17X26 10 PK STRL BLUE (TOWEL DISPOSABLE) ×4 IMPLANT
TRAY FOLEY CATH 14FRSI W/METER (CATHETERS) ×2 IMPLANT
TUBE ANAEROBIC SPECIMEN COL (MISCELLANEOUS) ×2 IMPLANT
WATER STERILE IRR 1000ML POUR (IV SOLUTION) ×2 IMPLANT

## 2012-07-17 NOTE — Significant Event (Addendum)
1200pm-taken to pre-op holding area for surgery, traveled to there via bed, on 2L [place], portable telemetry, VS stable. Report handed off to CRNA at bedside. Coriana Angello, Charity fundraiser.

## 2012-07-17 NOTE — Preoperative (Signed)
Beta Blockers   Reason not to administer Beta Blockers:Not Applicable 

## 2012-07-17 NOTE — Significant Event (Signed)
0815-Accepted patient from [facility], via [facility]. Patient alert/oriented X 4. Has pleural tube on left side, with small to moderate air leak, SQ air noted on left chest, left & right neck. Placed on monitors-VS stable. No personal belongings arrived with patient. Patient oriented to room/unit and care plan for today. Patient endorsed pain, rated pain level of 8, denied SOB at the time. Bed in lowest position, call bell within reach. Ngan Qualls, Charity fundraiser.

## 2012-07-17 NOTE — Progress Notes (Signed)
S/p VATS, blebectomy  BP 124/78  Pulse 115  Temp(Src) 98.6 F (37 C) (Oral)  Resp 14  Ht 5\' 7"  (1.702 m)  Wt 173 lb 4.5 oz (78.6 kg)  BMI 27.13 kg/m2  SpO2 100%   Intake/Output Summary (Last 24 hours) at 07/17/12 1846 Last data filed at 07/17/12 1815  Gross per 24 hour  Intake 1662.83 ml  Output   1100 ml  Net 562.83 ml    C/o mucous/ congestion- requesting mucinex- ordered  Pain well controlled

## 2012-07-17 NOTE — Op Note (Signed)
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   Postoperative Dx:  Same   Procedure:  Left video-assisted thoracoscopy, stapling of apical bleb, mechanical pleurodesis.  Surgeon:  Dr. Alleen Borne  Assistant:  Lowella Dandy, PA-C  Anesthesia: GET   Clinical History:  The patient is a 47 year old gentleman with a history of sarcoid lung disease on home oxygen, OSA who presented to the [facility] with a left tension ptx with acute respiratory failure. He was intubated and had a left chest tube placed by the ER physician. The chest tube looks like it went into the fissure but the left ptx was markedly reduced with a small residual. There was initially a small amount of subcutaneous emphysema around the tube but this has subsequently spread out over the chest and neck. The chest tube has gradually retracted and yesterday one of the side holes was in the subcutaneous tissue. He continued to have an air leak and I felt it would be best to proceed with VATS for stapling of the air leak and mechanical pleurodesis. I discussed the procedure with the patient and family including alternatives, benefits and risks including but not limited to bleeding, infection, persistent air leak, and recurrent pneumothorax. He understood and agreed to proceed.   Operative procedure:  The patient was seen in the preoperative holding area. The proper patient, proper operative side, proper operation were confirmed after reviewing his history and chest x-ray. The left side of the chest was signed by me. Preoperative intravenous antibiotics were given. He was taken back to the operating room and placed on table in the supine position. After induction of general endotracheal anesthesia using a double-lumen tube, a Foley catheter was placed in the bladder using sterile technique. Lower extremity  sequential compression devices were used. The patient was positioned in the right lateral decubitus position with the left side up. The previously placed chest tube was removed. The left side of the chest was prepped with Betadine soap and solution and draped in the usual sterile manner. A timeout was taken and the proper patient, proper operation, and proper operative side were confirmed with nursing and anesthesia staff. A 1 cm incision was made in the midaxillary line at about the eighth intercostal space. The left pleural space was entered bluntly with a hemostat and an 8 mm trocar was inserted. The 30 thoracoscope was inserted and the pleural space inspected. There was severe sarcoid lung disease. The lung was very firm and noncompliant. A second 1 cm incision was made in the posterior axillary line at the fifth intercostal space for insertion of instruments. The previous chest tube site was used for insertion of instruments. The apex of the lung was examined and there was a bleb there corresponding to the one seen on CT scan. This had some fibrinous exudate around it and looked like it may be the source of the air leak. A noncutting stapler was used to ligate this bleb at its base. The remainder of the upper and lower lobe were examined and no other blebs were seen. The lung was inflated under direct vision and I could not see any other sites of air leak. Given the severity of the lung disease it is certainly possible that the patient was leaking air from a site other than the apical bleb. Therefore a complete mechanical pleurodesis was performed using a piece of an abrasive  pad placed in a long clamp. Then two 28 French chest tubes were placed. One was positioned through the midaxillary incision and advanced posteriorly up to the apex. The second chest tube was inserted through a small incision anterior to the first and advanced up anteriorly to the apex. These were fixed to the skin with silk sutures. The  lung was reinflated under direct vision and I did not see any significant air leak. Hemostasis appeared adequate. The posterior axillary incision was then closed in layers using a 2-0 Vicryl subcutaneous suture and 3-0 Vicryl subcuticular skin closure. The previous chest tube site was about 3 cm long. The muscle was reapproximated with interrupted 2-0 Vicryl sutures and the subcutaneous tissue and skin were closed as a single layer using interrupted 3-0 nylon sutures. The chest tubes were connected to Pleur-evac suction. The sponge needle and instrument counts were correct according to the scrub nurse. The patient was then turned into the supine position, extubated, and transported to the post anesthesia care unit in satisfactory and stable condition.

## 2012-07-17 NOTE — Anesthesia Preprocedure Evaluation (Addendum)
Anesthesia Evaluation  Patient identified by MRN, date of birth, ID band Patient awake    Reviewed: Allergy & Precautions, H&P , NPO status , Patient's Chart, lab work & pertinent test results  Airway Mallampati: II  Neck ROM: full    Dental  (+) Dental Advisory Given and Teeth Intact   Pulmonary sleep apnea , pneumonia -, COPD         Cardiovascular hypertension,     Neuro/Psych    GI/Hepatic GERD-  ,  Endo/Other    Renal/GU      Musculoskeletal   Abdominal   Peds  Hematology   Anesthesia Other Findings   Reproductive/Obstetrics                          Anesthesia Physical Anesthesia Plan  ASA: III  Anesthesia Plan: General   Post-op Pain Management:    Induction: Intravenous  Airway Management Planned: Double Lumen EBT  Additional Equipment: Arterial line and CVP  Intra-op Plan:   Post-operative Plan: Possible Post-op intubation/ventilation  Informed Consent: I have reviewed the patients History and Physical, chart, labs and discussed the procedure including the risks, benefits and alternatives for the proposed anesthesia with the patient or authorized representative who has indicated his/her understanding and acceptance.   Dental advisory given  Plan Discussed with: CRNA, Anesthesiologist and Surgeon  Anesthesia Plan Comments:        Anesthesia Quick Evaluation

## 2012-07-17 NOTE — Transfer of Care (Signed)
Immediate Anesthesia Transfer of Care Note  Patient: [name]  Procedure(s) Performed: Procedure(s): VIDEO ASSISTED THORACOSCOPY (VATS)/BLEB Stapling (Left)  Patient Location: PACU  Anesthesia Type:General  Level of Consciousness: awake, alert  and oriented  Airway & Oxygen Therapy: Patient Spontanous Breathing and Patient connected to face mask oxygen  Post-op Assessment: Report given to PACU RN, Post -op Vital signs reviewed and stable and Patient moving all extremities X 4  Post vital signs: Reviewed and stable  Complications: No apparent anesthesia complications

## 2012-07-17 NOTE — Progress Notes (Signed)
Pt says he hasnt worn CPAP since Tuesday anf tolerated it poorly.  Pt has opted not to use tonight.

## 2012-07-17 NOTE — Anesthesia Procedure Notes (Signed)
Procedure Name: Intubation Date/Time: 07/17/2012 12:31 PM Performed by: Elon Alas Pre-anesthesia Checklist: Patient identified, Timeout performed, Emergency Drugs available, Suction available and Patient being monitored Patient Re-evaluated:Patient Re-evaluated prior to inductionOxygen Delivery Method: Circle system utilized Preoxygenation: Pre-oxygenation with 100% oxygen Intubation Type: IV induction Ventilation: Mask ventilation without difficulty Laryngoscope Size: Mac and 4 Grade View: Grade II Endobronchial tube: Left and Double lumen EBT and 39 Fr Number of attempts: 1 Airway Equipment and Method: Stylet Placement Confirmation: positive ETCO2,  ETT inserted through vocal cords under direct vision and breath sounds checked- equal and bilateral Secured at: 29 cm Tube secured with: Tape Dental Injury: Teeth and Oropharynx as per pre-operative assessment

## 2012-07-17 NOTE — Progress Notes (Signed)
PULMONARY  / CRITICAL CARE MEDICINE  Name: [name] MRN: [id number] DOB: 1965-09-03    ADMISSION DATE:  07/10/2012 CONSULTATION DATE:  07/10/12  REFERRING MD :  [name] PRIMARY SERVICE: PCCM  CHIEF COMPLAINT:  Acute resp failure, tension ptx in setting advanced sarcoidosis  BRIEF PATIENT DESCRIPTION: 47 y/o male with PMH of sarcoidosis, OSA, HTN, and home O2 use who came into [facility] with ARF r/t tension pneumothorax.  Patient was intubated and chest tube placed by [name]>  SIGNIFICANT EVENTS / STUDIES:  6/17 ct chest: 10.1 x 7.8 cm thin-walled cavitary/bullous lesion in the posterior right upper lobe, unchanged. Confluent perihilar opacities with traction bronchiectasis and architectural distortion, related to known sarcoidosis. Associated peribronchovascular nodularity bilaterally. 6/30 -  tension PTX, resp failure, chest tube placed, intubated. 7/1 -  Extubated  LINES / TUBES: 6/30 ETT >> 7/1 6/30 L chest tube >>  6/30 L subclavian CVC >>   CULTURES: 6/30 resp>>> abundant staph, abundant pseudomonas 6/30 BC >> NEG  ANTIBIOTICS: Ceftazidime 3/30>>>7/2 vanc 6/30>>>7/1 levaquin 7/2>>>  SUBJECTIVE:  [facility] for [facility] this PM No acute distress  VITAL SIGNS: Temp:  [97.8 F (36.6 C)-98.8 F (37.1 C)] 98.5 F (36.9 C) (07/07 0830) Pulse Rate:  [74-115] 81 (07/07 0830) Resp:  [10-26] 19 (07/07 0830) BP: (112-179)/(46-119) 139/86 mmHg (07/07 0830) SpO2:  [95 %-100 %] 100 % (07/07 0830) Weight:  [78.6 kg (173 lb 4.5 oz)-83.6 kg (184 lb 4.9 oz)] 78.6 kg (173 lb 4.5 oz) (07/07 0820)   INTAKE / OUTPUT: Intake/Output     07/06 0701 - 07/07 0700 07/07 0701 - 07/08 0700   P.O. 300    I.V. (mL/kg) 458.3 (5.5)    Total Intake(mL/kg) 758.3 (9.1)    Urine (mL/kg/hr) 300 (0.1) 375 (1.9)   Chest Tube 20 (0)    Total Output 320 375   Net +438.3 -375        Urine Occurrence 2 x    Stool Occurrence 2 x      PHYSICAL EXAMINATION: General: NAD, significant sub cut  air Neuro: no focal deficits HEENT: WNL, cushingoid facies Cardiovascular: RRR s M Lungs:  Crepitus across chest, no wheezes Abdomen: soft, NT, NABS Ext: warm, No edema  LABS:  Recent Labs Lab 07/13/12 0500 07/14/12 0500 07/17/12 0410  NA 136 140 140  K 3.7 3.9 3.7  CL 97 99 102  CO2 33* 34* 32  BUN 8 10 15   CREATININE 0.89 0.93 1.09  GLUCOSE 103* 110* 119*    Recent Labs Lab 07/13/12 0500 07/14/12 0500 07/17/12 0410  HGB 10.6* 11.6* 10.6*  HCT 34.4* 38.2* 35.1*  WBC 6.8 8.7 4.6  PLT 265 346 334    Recent Labs Lab 07/10/12 1446 07/10/12 1728 07/11/12 0530 07/12/12 0452 07/12/12 0453 07/13/12 0500 07/14/12 0500 07/17/12 0410  PROCALCITON  --  0.96 1.85 1.13  --   --   --   --   WBC 12.0*  --  9.4  --  10.4 6.8 8.7 4.6    Recent Labs Lab 07/15/12 1659 07/16/12 0021 07/16/12 0733 07/16/12 1606 07/16/12 2335  GLUCAP 154* 127* 147* 116* 128*   Chest tube:  Mod air leak CXR: NSC L ptx. Chest tube has migrated out such that side port is in soft tissue .   ASSESSMENT / PLAN:  Acute on Chronic Respiratory failure Left PTX c/b extensive subq emphysema -  CAP vs purulent bronchitis Pulm sarcoidosis   P:   Cont  supplemental O2 Cont BDs TCTS following - plan stapling of bleb and pleurodesis 7/07, today Micro and abx as above  HTN AF > NSR P:  Cont PO dilt and metoprolol Cont PRN hydralazine  Anemia   P:  Monitor CBC intermittently  Prednisone dependent  P:   Stress steroids for OR  Constipation -  P:  Cont bowel regimen   We will cont to follow at [facility] and see postop  I have personally obtained a history, examined the patient, evaluated laboratory and imaging results, formulated the assessment and plan and placed orders.   Dorcas Carrow Beeper  [phone]  Cell  [phone]  If no response or cell goes to voicemail, call beeper [phone]

## 2012-07-17 NOTE — Brief Op Note (Signed)
07/10/2012 - 07/17/2012  2:19 PM  PATIENT:  [name]  47 y.o. male  PRE-OPERATIVE DIAGNOSIS:  Bleb stapling left  POST-OPERATIVE DIAGNOSIS:  left bleb stapling  PROCEDURE:  Procedure(s):  VIDEO ASSISTED THORACOSCOPY (VATS -Stapling Apical Bleb -Mechanical Pleurodesis  SURGEON:  Surgeon(s) and Role:    * Alleen Borne, MD - Primary  PHYSICIAN ASSISTANT: Erin Barrett PA-C  ANESTHESIA:   general  EBL:  Total I/O In: -  Out: 900 [Urine:900]  BLOOD ADMINISTERED:none  DRAINS: 28 Straight chest tube left chest x 2   LOCAL MEDICATIONS USED:  NONE  SPECIMEN:  No Specimen  DISPOSITION OF SPECIMEN:  N/A  COUNTS:  YES  TOURNIQUET:  * No tourniquets in log *  DICTATION: .Dragon Dictation  PLAN OF CARE: Admit to inpatient   PATIENT DISPOSITION:  PACU - hemodynamically stable.   Delay start of Pharmacological VTE agent (>24hrs) due to surgical blood loss or risk of bleeding: yes

## 2012-07-18 ENCOUNTER — Inpatient Hospital Stay (HOSPITAL_COMMUNITY): Payer: 59

## 2012-07-18 ENCOUNTER — Encounter (HOSPITAL_COMMUNITY): Payer: Self-pay | Admitting: Surgery

## 2012-07-18 LAB — CBC
MCHC: 31.1 g/dL (ref 30.0–36.0)
Platelets: 289 10*3/uL (ref 150–400)
RDW: 14.5 % (ref 11.5–15.5)

## 2012-07-18 LAB — GLUCOSE, CAPILLARY

## 2012-07-18 LAB — BASIC METABOLIC PANEL
BUN: 9 mg/dL (ref 6–23)
GFR calc Af Amer: >90 mL/min (ref >90)
GFR calc non Af Amer: >90 mL/min (ref >90)
Potassium: 4.2 mEq/L (ref 3.5–5.1)
Sodium: 135 mEq/L (ref 135–145)

## 2012-07-18 LAB — POCT I-STAT 3, ART BLOOD GAS (G3+)
Acid-Base Excess: 7 mmol/L — ABNORMAL HIGH (ref 0.0–2.0)
Patient temperature: 97.7
TCO2: 34 mmol/L (ref 0–100)
pH, Arterial: 7.421 (ref 7.350–7.450)

## 2012-07-18 MED ORDER — DEXTROSE-NACL 5-0.45 % IV SOLN
INTRAVENOUS | Status: DC
  Administered 2012-07-20 – 2012-07-22 (×2): via INTRAVENOUS

## 2012-07-18 MED ORDER — PREDNISONE 10 MG PO TABS
10.0000 mg | ORAL_TABLET | Freq: Every day | ORAL | Status: DC
  Administered 2012-07-19 – 2012-07-21 (×3): 10 mg via ORAL
  Filled 2012-07-18 (×4): qty 1

## 2012-07-18 NOTE — Plan of Care (Signed)
Problem: Phase I Progression Outcomes Goal: Initial discharge plan identified Outcome: Completed/Met Date Met:  07/18/12 Home with family

## 2012-07-18 NOTE — Progress Notes (Signed)
1 Day Post-Op Procedure(s) (LRB): VIDEO ASSISTED THORACOSCOPY (VATS)/BLEB Stapling (Left) Subjective: No complaints. Pain under control  Objective: Vital signs in last 24 hours: Temp:  [97.6 F (36.4 C)-98.7 F (37.1 C)] 98 F (36.7 C) (07/08 0722) Pulse Rate:  [76-120] 82 (07/08 0700) Cardiac Rhythm:  [-] Normal sinus rhythm (07/08 0400) Resp:  [10-26] 22 (07/08 0700) BP: (99-139)/(72-104) 126/93 mmHg (07/08 0700) SpO2:  [90 %-100 %] 99 % (07/08 0700) Arterial Line BP: (104-159)/(66-115) 115/90 mmHg (07/08 0700) FiO2 (%):  [28 %] 28 % (07/07 1945) Weight:  [78.6 kg (173 lb 4.5 oz)-83.6 kg (184 lb 4.9 oz)] 83.6 kg (184 lb 4.9 oz) (07/08 0500)  Hemodynamic parameters for last 24 hours:    Intake/Output from previous day: 07/07 0701 - 07/08 0700 In: 3952.8 [P.O.:960; I.V.:2342.8; IV Piggyback:650] Out: 3995 [Urine:3745; Chest Tube:250] Intake/Output this shift:    General appearance: alert and cooperative Neurologic: intact Heart: regular rate and rhythm, S1, S2 normal, no murmur, click, rub or gallop Lungs: rales left lung chest tube with small 1+ air leak  Lab Results:  Recent Labs  07/17/12 0410 07/18/12 0410  WBC 4.6 8.8  HGB 10.6* 10.8*  HCT 35.1* 34.7*  PLT 334 289   BMET:  Recent Labs  07/17/12 0410 07/18/12 0410  NA 140 135  K 3.7 4.2  CL 102 99  CO2 32 32  GLUCOSE 119* 123*  BUN 15 9  CREATININE 1.09 0.88  CALCIUM 9.2 8.8    PT/INR:  Recent Labs  07/17/12 0410  LABPROT 12.4  INR 0.94   ABG    Component Value Date/Time   PHART 7.421 07/18/2012 0435   HCO3 32.8* 07/18/2012 0435   TCO2 34 07/18/2012 0435   ACIDBASEDEF 4.5* 07/11/2012 0406   O2SAT 98.0 07/18/2012 0435   CBG (last 3)   Recent Labs  07/17/12 1657 07/17/12 2323 07/18/12 0724  GLUCAP 113* 118* 112*   CXR:  No ptx. Tubes in good position. Subcutaneous emphysema stable.  Assessment/Plan: S/P Procedure(s) (LRB): VIDEO ASSISTED THORACOSCOPY (VATS)/BLEB Stapling  (Left) Sarcoid lung disease. Treatment per pulmonary. Continue chest tubes but decrease suction to 10 cm DC foley and A-line Mobilize Plan for transfer to step-down: see transfer orders   LOS: 8 days    Amiee Wiley K 07/18/2012

## 2012-07-18 NOTE — Progress Notes (Signed)
Transferred to 3309 via wheelchair. Portable monitor and oxygen on.  No changes.

## 2012-07-18 NOTE — Progress Notes (Signed)
Utilization review completed.  

## 2012-07-18 NOTE — Progress Notes (Signed)
PULMONARY  / CRITICAL CARE MEDICINE  Name: [name] MRN: [id number] DOB: [birth year]    ADMISSION DATE:  07/10/2012 CONSULTATION DATE:  07/10/12  REFERRING MD :  [name] PRIMARY SERVICE: PCCM  CHIEF COMPLAINT:  Acute resp failure, tension ptx in setting advanced sarcoidosis  BRIEF PATIENT DESCRIPTION: 47 y/o male with PMH of sarcoidosis, OSA, HTN, and home O2 use who came into [facility] with ARF r/t tension pneumothorax.  Patient was intubated and chest tube placed by [name]>  SIGNIFICANT EVENTS / STUDIES:  6/17 ct chest: 10.1 x 7.8 cm thin-walled cavitary/bullous lesion in the posterior right upper lobe, unchanged. Confluent perihilar opacities with traction bronchiectasis and architectural distortion, related to known sarcoidosis. Associated peribronchovascular nodularity bilaterally. 6/30 -  tension PTX, resp failure, chest tube placed, intubated. 7/1 -  Extubated 07/17/12: to OR for VATS, LUL bleb resection at leak site and Stapling. Per Bartle  LINES / TUBES: 6/30 ETT >> 7/1 6/30 L chest tube >> 7/7 6/30 L subclavian CVC >>  7/7 2 L CTs >>  CULTURES: 6/30 resp>>> abundant staph, abundant pseudomonas 6/30 BC >> NEG  ANTIBIOTICS: Ceftazidime 3/30>>>7/2 vanc 6/30>>>7/1 levaquin 7/2>>>7/7 Cefuroxime 7/7 (postop proph) vanco 7/7 (proph)  SUBJECTIVE:  Pt doing well with VATS. Postop looks good. Min air leak L CTs VITAL SIGNS: Temp:  [97.6 F (36.4 C)-98.7 F (37.1 C)] 98 F (36.7 C) (07/08 0722) Pulse Rate:  [76-120] 82 (07/08 0700) Resp:  [10-26] 18 (07/08 0846) BP: (99-135)/(72-104) 126/93 mmHg (07/08 0700) SpO2:  [90 %-100 %] 97 % (07/08 0846) Arterial Line BP: (104-159)/(66-115) 115/90 mmHg (07/08 0700) FiO2 (%):  [28 %] 28 % (07/07 1945) Weight:  [83.6 kg (184 lb 4.9 oz)] 83.6 kg (184 lb 4.9 oz) (07/08 0500)   INTAKE / OUTPUT: Intake/Output     07/07 0701 - 07/08 0700 07/08 0701 - 07/09 0700   P.O. 960    I.V. (mL/kg) 2342.8 (28)    IV Piggyback 650    Total  Intake(mL/kg) 3952.8 (47.3)    Urine (mL/kg/hr) 3745 (1.9)    Chest Tube 250 (0.1)    Total Output 3995     Net -42.2            PHYSICAL EXAMINATION: General: NAD, significant sub cut air but much less  Neuro: no focal deficits HEENT: WNL, cushingoid facies Cardiovascular: RRR s M Lungs:  Less Crepitus across chest, no wheezes Abdomen: soft, NT, NABS Ext: warm, No edema  LABS:  Recent Labs Lab 07/14/12 0500 07/17/12 0410 07/18/12 0410  NA 140 140 135  K 3.9 3.7 4.2  CL 99 102 99  CO2 34* 32 32  BUN 10 15 9   CREATININE 0.93 1.09 0.88  GLUCOSE 110* 119* 123*    Recent Labs Lab 07/14/12 0500 07/17/12 0410 07/18/12 0410  HGB 11.6* 10.6* 10.8*  HCT 38.2* 35.1* 34.7*  WBC 8.7 4.6 8.8  PLT 346 334 289    Recent Labs Lab 07/12/12 0452  07/13/12 0500 07/14/12 0500 07/17/12 0410 07/18/12 0410  PROCALCITON 1.13  --   --   --   --   --   WBC  --   < > 6.8 8.7 4.6 8.8  < > = values in this interval not displayed.  Recent Labs Lab 07/16/12 2335 07/17/12 0932 07/17/12 1657 07/17/12 2323 07/18/12 0724  GLUCAP 128* 90 113* 118* 112*   Chest tube: less air leak CXR: 2 CTs on L, improved aeration .   ASSESSMENT / PLAN:  Acute  on Chronic Respiratory failure Left PTX c/b extensive subq emphysema - s/p VATS CAP vs purulent bronchitis Pulm sarcoidosis   P:   Cont supplemental O2 Cont BDs Per TCTS HTN AF > NSR P:  Cont PO dilt and metoprolol Cont PRN hydralazine  Anemia   P:  Monitor CBC intermittently  Prednisone dependent  P:   Back to po pred   Constipation -  P:  Cont bowel regimen   I have personally obtained a history, examined the patient, evaluated laboratory and imaging results, formulated the assessment and plan and placed orders.   Dorcas Carrow Beeper  [phone]  Cell  [phone]  If no response or cell goes to voicemail, call beeper [phone]

## 2012-07-18 NOTE — Anesthesia Postprocedure Evaluation (Signed)
  Anesthesia Post-op Note  Patient: [name]  Procedure(s) Performed: Procedure(s): VIDEO ASSISTED THORACOSCOPY (VATS)/BLEB Stapling (Left)  Patient Location: Nursing Unit  Anesthesia Type:General  Level of Consciousness: awake, alert , oriented and patient cooperative  Airway and Oxygen Therapy: Patient Spontanous Breathing and Patient connected to nasal cannula oxygen  Post-op Pain: moderate  Post-op Assessment: Post-op Vital signs reviewed, Patient's Cardiovascular Status Stable, Respiratory Function Stable, Patent Airway, No signs of Nausea or vomiting, Adequate PO intake, Pain level controlled, No headache, No backache, No residual numbness and No residual motor weakness  Post-op Vital Signs: Reviewed and stable  Complications: No apparent anesthesia complications

## 2012-07-19 ENCOUNTER — Inpatient Hospital Stay (HOSPITAL_COMMUNITY): Payer: 59

## 2012-07-19 LAB — CBC
MCH: 25.2 pg — ABNORMAL LOW (ref 26.0–34.0)
Platelets: 287 10*3/uL (ref 150–400)
RBC: 4.17 MIL/uL — ABNORMAL LOW (ref 4.22–5.81)
RDW: 14.7 % (ref 11.5–15.5)

## 2012-07-19 LAB — GLUCOSE, CAPILLARY
Glucose-Capillary: 107 mg/dL — ABNORMAL HIGH (ref 70–99)
Glucose-Capillary: 155 mg/dL — ABNORMAL HIGH (ref 70–99)
Glucose-Capillary: 111 mg/dL — ABNORMAL HIGH (ref 70–99)

## 2012-07-19 LAB — COMPREHENSIVE METABOLIC PANEL
ALT: 30 U/L (ref 0–53)
AST: 34 U/L (ref 0–37)
Albumin: 2.8 g/dL — ABNORMAL LOW (ref 3.5–5.2)
CO2: 32 mEq/L (ref 19–32)
Calcium: 8.9 mg/dL (ref 8.4–10.5)
Sodium: 141 mEq/L (ref 135–145)
Total Protein: 5.8 g/dL — ABNORMAL LOW (ref 6.0–8.3)

## 2012-07-19 MED ORDER — GUAIFENESIN ER 600 MG PO TB12
1200.0000 mg | ORAL_TABLET | Freq: Two times a day (BID) | ORAL | Status: DC
  Administered 2012-07-19 – 2012-07-23 (×10): 1200 mg via ORAL
  Filled 2012-07-19 (×12): qty 2

## 2012-07-19 MED ORDER — ALUM & MAG HYDROXIDE-SIMETH 200-200-20 MG/5ML PO SUSP
30.0000 mL | ORAL | Status: DC | PRN
  Administered 2012-07-19 – 2012-07-30 (×29): 30 mL via ORAL
  Filled 2012-07-19 (×33): qty 30

## 2012-07-19 MED ORDER — MORPHINE SULFATE 2 MG/ML IJ SOLN: 2.0000 mg | INTRAMUSCULAR | Status: DC | PRN

## 2012-07-19 NOTE — Progress Notes (Addendum)
      [address]       [name] [address]             [phone]      2 Days Post-Op Procedure(s) (LRB): VIDEO ASSISTED THORACOSCOPY (VATS)/BLEB Stapling (Left)  Subjective:  [name] complains of pain this morning.  States he is not getting much relief with pain medication.  He is also not able to take good deep breaths or use IS appropriately due to pain.  He has ambulated.  Objective: Vital signs in last 24 hours: Temp:  [97.5 F (36.4 C)-98.8 F (37.1 C)] 98.1 F (36.7 C) (07/09 0800) Pulse Rate:  [50-102] 80 (07/09 0800) Cardiac Rhythm:  [-] Normal sinus rhythm (07/09 0800) Resp:  [13-21] 18 (07/09 0800) BP: (72-128)/(58-92) 114/85 mmHg (07/09 0800) SpO2:  [91 %-98 %] 98 % (07/09 0800) Arterial Line BP: (145)/(78) 145/78 mmHg (07/08 0900) Weight:  [190 lb 9.6 oz (86.456 kg)] 190 lb 9.6 oz (86.456 kg) (07/09 0500)  Intake/Output from previous day: 07/08 0701 - 07/09 0700 In: 530 [I.V.:530] Out: 1005 [Urine:825; Chest Tube:180] Intake/Output this shift: Total I/O In: 70 [I.V.:20; IV Piggyback:50] Out: 80 [Chest Tube:80]  General appearance: alert, cooperative and no distress Heart: regular rate and rhythm Lungs: no wheezing present, sub q air across chest improving Abdomen: soft, non-tender; bowel sounds normal; no masses,  no organomegaly Extremities: extremities normal, atraumatic, no cyanosis or edema Wound: clean, some serous drainage around chest tube sites  Lab Results:  Recent Labs  07/18/12 0410 07/19/12 0535  WBC 8.8 6.3  HGB 10.8* 10.5*  HCT 34.7* 34.2*  PLT 289 287   BMET:  Recent Labs  07/18/12 0410 07/19/12 0535  NA 135 141  K 4.2 3.7  CL 99 103  CO2 32 32  GLUCOSE 123* 88  BUN 9 12  CREATININE 0.88 1.01  CALCIUM 8.8 8.9    PT/INR:  Recent Labs  07/17/12 0410  LABPROT 12.4  INR 0.94   ABG    Component Value Date/Time   PHART 7.421 07/18/2012 0435   HCO3 32.8* 07/18/2012 0435   TCO2 34 07/18/2012 0435   ACIDBASEDEF 4.5* 07/11/2012 0406   O2SAT 98.0 07/18/2012 0435   CBG (last 3)   Recent Labs  07/18/12 1549 07/18/12 2350 07/19/12 0724  GLUCAP 183* 155* 107*    Assessment/Plan: S/P Procedure(s) (LRB): VIDEO ASSISTED THORACOSCOPY (VATS)/BLEB Stapling (Left)  1. Chest tube- on 10 cm suction, does not appear to be air leak, however patient is unwilling to give a good cough to assess for air leak- CXR not done this morning will order 2. Pain control- on Fentanyl PCA, Percocet, add Morphine prn 3. Ambulate 4. Care per pulmonary   LOS: 9 days    [name] 07/19/2012   Chart reviewed, patient examined, agree with above. CXR stable There is a small, intermittant air leak. He complains of burning pain in mid chest with swallowing today. He is on pepcid and prn Maalox. I am not sure that this is reflux. He could have some esophagitis.

## 2012-07-19 NOTE — Progress Notes (Signed)
Moderate amount of serosanguinous drainage noted on pt's gown, pillow case and fitted sheet from chest tube site. Chest tube site assessed and sutures were still in place. Dressing reinforced with 4x4s and ABD pad.

## 2012-07-19 NOTE — Progress Notes (Signed)
PULMONARY  / CRITICAL CARE MEDICINE  Name: [name] MRN: [id number] DOB: 1965/09/14    ADMISSION DATE:  07/10/2012 CONSULTATION DATE:  07/10/12  REFERRING MD :  [name] PRIMARY SERVICE: PCCM  CHIEF COMPLAINT:  Acute resp failure, tension ptx in setting advanced sarcoidosis  BRIEF PATIENT DESCRIPTION: 47 y/o male with PMH of sarcoidosis, OSA, HTN, and home O2 use who came into [facility] with ARF r/t tension pneumothorax.  Patient was intubated and chest tube placed by [name]>  SIGNIFICANT EVENTS / STUDIES:  6/17 ct chest: 10.1 x 7.8 cm thin-walled cavitary/bullous lesion in the posterior right upper lobe, unchanged. Confluent perihilar opacities with traction bronchiectasis and architectural distortion, related to known sarcoidosis. Associated peribronchovascular nodularity bilaterally. 6/30 -  tension PTX, resp failure, chest tube placed, intubated. 7/1 -  Extubated 7/7 - bleb stapling on L  LINES / TUBES: 6/30 ETT >> 7/1 6/30 L chest tube >> 7/7 7/7 L chest tubes x 2 (Dr Laneta Simmers) >>  6/30 L subclavian CVC >>   CULTURES: 6/30 resp>>> abundant staph, abundant pseudomonas 6/30 BC >> NEG  ANTIBIOTICS: Ceftazidime 3/30>>>7/2 vanc 6/30>>>7/1 levaquin 7/2>>> ? When stopped Cefuroxime 7/7 >> post-op only  SUBJECTIVE:  Some increased pain this am Hasn't had BM since 7/7, no distension or abd pain  VITAL SIGNS: Temp:  [97.5 F (36.4 C)-98.8 F (37.1 C)] 98.1 F (36.7 C) (07/09 0800) Pulse Rate:  [50-102] 80 (07/09 0800) Resp:  [14-21] 18 (07/09 0800) BP: (72-124)/(58-92) 114/85 mmHg (07/09 0800) SpO2:  [92 %-98 %] 95 % (07/09 0912) Weight:  [86.456 kg (190 lb 9.6 oz)] 86.456 kg (190 lb 9.6 oz) (07/09 0500)   INTAKE / OUTPUT: Intake/Output     07/08 0701 - 07/09 0700 07/09 0701 - 07/10 0700   P.O.     I.V. (mL/kg) 530 (6.1) 20 (0.2)   IV Piggyback  50   Total Intake(mL/kg) 530 (6.1) 70 (0.8)   Urine (mL/kg/hr) 825 (0.4)    Chest Tube 180 (0.1) 80 (0.3)   Total Output 1005 80    Net -475 -10        Urine Occurrence 1 x      PHYSICAL EXAMINATION: General: NAD, significant sub cut air Neuro: no focal deficits HEENT: WNL, cushingoid facies Cardiovascular: RRR s M Lungs:  Crepitus across chest, no wheezes Abdomen: soft, NT, NABS Ext: warm, No edema  LABS:  Recent Labs Lab 07/17/12 0410 07/18/12 0410 07/19/12 0535  NA 140 135 141  K 3.7 4.2 3.7  CL 102 99 103  CO2 32 32 32  BUN 15 9 12   CREATININE 1.09 0.88 1.01  GLUCOSE 119* 123* 88    Recent Labs Lab 07/17/12 0410 07/18/12 0410 07/19/12 0535  HGB 10.6* 10.8* 10.5*  HCT 35.1* 34.7* 34.2*  WBC 4.6 8.8 6.3  PLT 334 289 287    Recent Labs Lab 07/14/12 0500 07/17/12 0410 07/18/12 0410 07/19/12 0535  WBC 8.7 4.6 8.8 6.3    Recent Labs Lab 07/17/12 2323 07/18/12 0724 07/18/12 1549 07/18/12 2350 07/19/12 0724  GLUCAP 118* 112* 183* 155* 107*   Chest tube:  Mod air leak CXR: NSC L ptx. Chest tube has migrated out such that side port is in soft tissue .   ASSESSMENT / PLAN:  Acute on Chronic Respiratory failure Left PTX c/b extensive subq emphysema - s/p L bleb stapling 7/7 CAP vs purulent bronchitis Pulm sarcoidosis, has been off daily steroids as of March 2014  P:   Cont supplemental O2  Cont BDs, Flovent (as substitute for QVAR) TCTS following chest tubes Completed levaquin course Wean pred to off if possible, currently on 10mg  qd.  HTN AF > NSR P:  Cont PO dilt and metoprolol, dilt was new in setting A fib this admission Cont PRN hydralazine  Anemia   P:  Monitor CBC intermittently  Constipation -  P:  Cont bowel regimen    I have personally obtained a history, examined the patient, evaluated laboratory and imaging results, formulated the assessment and plan and placed orders.  Levy Pupa, MD, PhD 07/19/2012, 9:54 AM [facility] [phone] or if no answer [phone]

## 2012-07-20 ENCOUNTER — Inpatient Hospital Stay (HOSPITAL_COMMUNITY): Payer: 59

## 2012-07-20 DIAGNOSIS — K219 Gastro-esophageal reflux disease without esophagitis: Secondary | ICD-10-CM

## 2012-07-20 LAB — BASIC METABOLIC PANEL
BUN: 10 mg/dL (ref 6–23)
Chloride: 100 mEq/L (ref 96–112)
Glucose, Bld: 95 mg/dL (ref 70–99)
Potassium: 4 mEq/L (ref 3.5–5.1)
Sodium: 138 mEq/L (ref 135–145)

## 2012-07-20 LAB — GLUCOSE, CAPILLARY
Glucose-Capillary: 107 mg/dL — ABNORMAL HIGH (ref 70–99)
Glucose-Capillary: 159 mg/dL — ABNORMAL HIGH (ref 70–99)
Glucose-Capillary: 244 mg/dL — ABNORMAL HIGH (ref 70–99)

## 2012-07-20 LAB — CBC
HCT: 33.4 % — ABNORMAL LOW (ref 39.0–52.0)
Hemoglobin: 10.4 g/dL — ABNORMAL LOW (ref 13.0–17.0)
MCH: 25.7 pg — ABNORMAL LOW (ref 26.0–34.0)
MCHC: 31.1 g/dL (ref 30.0–36.0)
RBC: 4.05 MIL/uL — ABNORMAL LOW (ref 4.22–5.81)

## 2012-07-20 NOTE — Progress Notes (Addendum)
PULMONARY  / CRITICAL CARE MEDICINE  Name: [name] MRN: [id number] DOB: 07/04/1965    ADMISSION DATE:  07/10/2012 CONSULTATION DATE:  07/10/12  REFERRING MD :  [name] PRIMARY SERVICE: PCCM  CHIEF COMPLAINT:  Acute resp failure, tension ptx in setting advanced sarcoidosis  BRIEF PATIENT DESCRIPTION: 47 y/o male with PMH of sarcoidosis, OSA, HTN, and home O2 use who came into [facility] with ARF r/t tension pneumothorax.  Patient was intubated and chest tube placed by [name]>  SIGNIFICANT EVENTS / STUDIES:  6/17 ct chest: 10.1 x 7.8 cm thin-walled cavitary/bullous lesion in the posterior right upper lobe, unchanged. Confluent perihilar opacities with traction bronchiectasis and architectural distortion, related to known sarcoidosis. Associated peribronchovascular nodularity bilaterally. 6/30 -  tension PTX, resp failure, chest tube placed, intubated. 7/1 -  Extubated 7/7 - bleb stapling on L  LINES / TUBES: 6/30 ETT >> 7/1 6/30 L chest tube >> 7/7 7/7 L chest tubes x 2 (Dr Laneta Simmers) >>  6/30 L subclavian CVC >>   CULTURES: 6/30 resp>>> abundant staph, abundant pseudomonas 6/30 BC >> NEG  ANTIBIOTICS: Ceftazidime 3/30>>>7/2 vanc 6/30>>>7/1 levaquin 7/2>>> ? When stopped Cefuroxime 7/7 >> post-op only  SUBJECTIVE:  Pain on swallowing seems to be better w mylanta I don't see an air leak today, but he has intermittently had one for last couple days Up to chair, looks stronger  VITAL SIGNS: Temp:  [97.7 F (36.5 C)-98.3 F (36.8 C)] 97.7 F (36.5 C) (07/10 1100) Pulse Rate:  [52-87] 75 (07/10 0700) Resp:  [12-21] 14 (07/10 0754) BP: (113-129)/(66-83) 123/66 mmHg (07/10 0700) SpO2:  [95 %-100 %] 95 % (07/10 0906)   INTAKE / OUTPUT: Intake/Output     07/09 0701 - 07/10 0700 07/10 0701 - 07/11 0700   P.O. 1800 360   I.V. (mL/kg) 580.3 (6.7)    IV Piggyback 150    Total Intake(mL/kg) 2530.3 (29.3) 360 (4.2)   Urine (mL/kg/hr) 575 (0.3)    Chest Tube 320 (0.2)    Total Output  895     Net +1635.3 +360        Urine Occurrence 2 x    Stool Occurrence 1 x      PHYSICAL EXAMINATION: General: NAD, significant sub cut air, up to chair Neuro: no focal deficits HEENT: WNL, cushingoid facies Cardiovascular: RRR s M Lungs:  Crepitus across chest, no wheezes Abdomen: soft, NT, NABS Ext: warm, No edema  LABS:  Recent Labs Lab 07/18/12 0410 07/19/12 0535 07/20/12 0440  NA 135 141 138  K 4.2 3.7 4.0  CL 99 103 100  CO2 32 32 36*  BUN 9 12 10   CREATININE 0.88 1.01 0.91  GLUCOSE 123* 88 95    Recent Labs Lab 07/18/12 0410 07/19/12 0535 07/20/12 0440  HGB 10.8* 10.5* 10.4*  HCT 34.7* 34.2* 33.4*  WBC 8.8 6.3 6.1  PLT 289 287 291    Recent Labs Lab 07/17/12 0410 07/18/12 0410 07/19/12 0535 07/20/12 0440  WBC 4.6 8.8 6.3 6.1    Recent Labs Lab 07/19/12 0724 07/19/12 1531 07/19/12 1700 07/20/12 0028 07/20/12 0724  GLUCAP 107* 284* 111* 159* 107*   Chest tubes:  Intermittent air leak CXR: 7/10 > continued L infiltrate greater than his baseline, no obvious PTX, chest tubes in place .   ASSESSMENT / PLAN:  Acute on Chronic Respiratory failure Left PTX c/b extensive subq emphysema - s/p L bleb stapling 7/7 CAP vs purulent bronchitis, treated Pulm sarcoidosis, has been off daily steroids as  of March 2014 OSA, usually on CPAP  P:   Cont supplemental O2 Cont BDs, Flovent (as substitute for QVAR) TCTS following chest tubes, still w intermittent airleak. May be able to d/c one tube ? Completed levaquin course Wean pred to off if possible, currently on 10mg  qd. CPAP is on hold to avoid positive pressure and promote healing. Will need to start back at some point, ? Before all chest tubes are d/c'd to insure he doesn't cause an airleak. Will discuss timing w Dr Laneta Simmers before tubes come out  HTN AF > NSR P:  Cont PO dilt and metoprolol, dilt was new in setting A fib this admission Cont PRN hydralazine  Anemia   P:  Monitor CBC  intermittently  Constipation -  Esophagitis  P:  Cont bowel regimen Cont pepcid     I have personally obtained a history, examined the patient, evaluated laboratory and imaging results, formulated the assessment and plan and placed orders.  Levy Pupa, MD, PhD 07/20/2012, 11:39 AM [facility] [phone] or if no answer [phone]

## 2012-07-20 NOTE — Progress Notes (Addendum)
TCTS DAILY ICU PROGRESS NOTE                   [name]            [name] [address]          [phone]   3 Days Post-Op Procedure(s) (LRB): VIDEO ASSISTED THORACOSCOPY (VATS)/BLEB Stapling (Left)  Total Length of Stay:  LOS: 10 days   Subjective: Feels ok, still with significant secretions  Objective: Vital signs in last 24 hours: Temp:  [97.7 F (36.5 C)-98.3 F (36.8 C)] 97.8 F (36.6 C) (07/10 0700) Pulse Rate:  [52-87] 75 (07/10 0700) Cardiac Rhythm:  [-] Normal sinus rhythm (07/10 0754) Resp:  [12-21] 14 (07/10 0754) BP: (113-129)/(66-83) 123/66 mmHg (07/10 0700) SpO2:  [95 %-100 %] 96 % (07/10 0754)  Filed Weights   07/17/12 0820 07/18/12 0500 07/19/12 0500  Weight: 173 lb 4.5 oz (78.6 kg) 184 lb 4.9 oz (83.6 kg) 190 lb 9.6 oz (86.456 kg)    Weight change:    Hemodynamic parameters for last 24 hours:    Intake/Output from previous day: 07/09 0701 - 07/10 0700 In: 2510.3 [P.O.:1800; I.V.:560.3; IV Piggyback:150] Out: 895 [Urine:575; Chest Tube:320]  Intake/Output this shift:    Current Meds: Scheduled Meds: . amitriptyline  50 mg Oral QHS  . bisacodyl  10 mg Oral Daily  . cholecalciferol  1,000 Units Oral q morning - 10a  . diltiazem  120 mg Oral Q12H  . enoxaparin (LOVENOX) injection  40 mg Subcutaneous Q24H  . famotidine  20 mg Oral BID  . fentaNYL   Intravenous Q4H  . fluticasone  2 spray Each Nare Daily  . fluticasone  2 puff Inhalation BID  . guaiFENesin  1,200 mg Oral BID  . levalbuterol  0.63 mg Nebulization Q6H  . metoprolol tartrate  50 mg Oral BID  . polyethylene glycol  17 g Oral Daily  . predniSONE  10 mg Oral Q breakfast  . senna-docusate  2 tablet Oral BID   Continuous Infusions: . dextrose 5 % and 0.45% NaCl 20 mL/hr at 07/19/12 1900   PRN Meds:.sodium chloride, alum & mag hydroxide-simeth, bisacodyl, diphenhydrAMINE, hydrALAZINE, levalbuterol, LORazepam, metoprolol, naloxone, ondansetron (ZOFRAN) IV,  ondansetron (ZOFRAN) IV, oxyCODONE-acetaminophen, potassium chloride, sodium chloride, sodium chloride, sodium chloride  General appearance: alert, cooperative and no distress Heart: regular rate and rhythm Lungs: dim in bases Abdomen: mod distension Wound: dresings intact  Lab Results: CBC: Recent Labs  07/19/12 0535 07/20/12 0440  WBC 6.3 6.1  HGB 10.5* 10.4*  HCT 34.2* 33.4*  PLT 287 291   BMET:  Recent Labs  07/19/12 0535 07/20/12 0440  NA 141 138  K 3.7 4.0  CL 103 100  CO2 32 36*  GLUCOSE 88 95  BUN 12 10  CREATININE 1.01 0.91  CALCIUM 8.9 8.8    PT/INR: No results found for this basename: LABPROT, INR,  in the last 72 hours Radiology: Dg Chest Port 1 View  07/20/2012   *RADIOLOGY REPORT*  Clinical Data: Chest tube, sarcoidosis  PORTABLE CHEST - 1 VIEW  Comparison: Prior chest x-ray 07/19/2012  Findings: Stable position of left subclavian approach central venous catheter with the tip in the mid superior vena cava.  To apically directed left-sided thoracostomy tubes are in unchanged position.  Stable extensive background pulmonary parenchymal changes with large bulla in the right mid lung.  Slightly improved but still diffuse subcutaneous emphysema.  No definite pneumothorax.  No significant overall interval  change.  IMPRESSION:  1. No significant interval change in the appearance of the lungs. 2.  Slight interval decrease in the diffuse subcutaneous emphysema. 3.  Stable and satisfactory support apparatus.   Original Report Authenticated By: Malachy Moan, M.D.   Dg Chest Port 1 View  07/19/2012   *RADIOLOGY REPORT*  Clinical Data: Post VATS, history of sarcoidosis  PORTABLE CHEST - 1 VIEW  Comparison: Portable chest x-ray of 07/18/2012  Findings: The left chest tubes remain and no definite left pneumothorax is seen.  Considerable left chest wall and neck subcutaneous air is noted.  The right basilar atelectasis remains. The left central venous line tip is unchanged in  position with mild cardiomegaly present.  IMPRESSION:  1.  No change in poor aeration with basilar atelectasis.  No definite pneumothorax with left chest tubes present. 2.  Diffuse subcutaneous emphysema throughout the left chest wall and neck.   Original Report Authenticated By: Dwyane Dee, M.D.     Assessment/Plan: S/P Procedure(s) (LRB): VIDEO ASSISTED THORACOSCOPY (VATS)/BLEB Stapling (Left)  1. Chest tube- + air leak, cont to suction. The air leak does appear intermittant 2  Cont current abx, pulm management as per CCM    [name] 07/20/2012 8:51 AM  Chart reviewed, patient examined, agree with above. Small intermittent air leak. CXR looks stable without ptx and subcutaneous air is resolving. If the chest tube drainage continues to decrease we can remove the anterior tube.

## 2012-07-21 ENCOUNTER — Inpatient Hospital Stay (HOSPITAL_COMMUNITY): Payer: 59

## 2012-07-21 LAB — GLUCOSE, CAPILLARY
Glucose-Capillary: 80 mg/dL (ref 70–99)
Glucose-Capillary: 120 mg/dL — ABNORMAL HIGH (ref 70–99)

## 2012-07-21 LAB — CBC
Hemoglobin: 10.2 g/dL — ABNORMAL LOW (ref 13.0–17.0)
MCH: 25.4 pg — ABNORMAL LOW (ref 26.0–34.0)
MCHC: 30.8 g/dL (ref 30.0–36.0)
MCV: 82.3 fL (ref 78.0–100.0)
RBC: 4.02 MIL/uL — ABNORMAL LOW (ref 4.22–5.81)

## 2012-07-21 LAB — BASIC METABOLIC PANEL
BUN: 7 mg/dL (ref 6–23)
CO2: 35 mEq/L — ABNORMAL HIGH (ref 19–32)
Calcium: 9 mg/dL (ref 8.4–10.5)
GFR calc non Af Amer: >90 mL/min (ref >90)
Glucose, Bld: 153 mg/dL — ABNORMAL HIGH (ref 70–99)
Potassium: 3.8 mEq/L (ref 3.5–5.1)
Sodium: 138 mEq/L (ref 135–145)

## 2012-07-21 MED ORDER — PREDNISONE 5 MG PO TABS
5.0000 mg | ORAL_TABLET | Freq: Every day | ORAL | Status: DC
  Administered 2012-07-22 – 2012-07-27 (×6): 5 mg via ORAL
  Filled 2012-07-21 (×7): qty 1

## 2012-07-21 NOTE — Progress Notes (Signed)
Utilization review completed.  

## 2012-07-21 NOTE — Progress Notes (Addendum)
PULMONARY  / CRITICAL CARE MEDICINE  Name: [name] MRN: [id number] DOB: March 22, 1965    ADMISSION DATE:  07/10/2012 CONSULTATION DATE:  07/10/12  REFERRING MD :  [name] PRIMARY SERVICE: PCCM  CHIEF COMPLAINT:  Acute resp failure, tension ptx in setting advanced sarcoidosis  BRIEF PATIENT DESCRIPTION: 47 y/o male with PMH of sarcoidosis, OSA, HTN, and home O2 use who came into [facility] with ARF r/t tension pneumothorax.  Patient was intubated and chest tube placed by [name]>  SIGNIFICANT EVENTS / STUDIES:  6/17 ct chest: 10.1 x 7.8 cm thin-walled cavitary/bullous lesion in the posterior right upper lobe, unchanged. Confluent perihilar opacities with traction bronchiectasis and architectural distortion, related to known sarcoidosis. Associated peribronchovascular nodularity bilaterally. 6/30 -  tension PTX, resp failure, chest tube placed, intubated. 7/1 -  Extubated 7/7 - bleb stapling on L  LINES / TUBES: 6/30 ETT >> 7/1 6/30 L chest tube >> 7/7 7/7 L chest tubes x 2 (Dr Laneta Simmers) >>  6/30 L subclavian CVC >>   CULTURES: 6/30 resp>>> abundant staph, abundant pseudomonas 6/30 BC >> NEG  ANTIBIOTICS: Ceftazidime 3/30>>>7/2 vanc 6/30>>>7/1 levaquin 7/2>>> ? When stopped Cefuroxime 7/7 >> post-op only  SUBJECTIVE:  Up to chair, looks stronger Still some indigestion but better  VITAL SIGNS: Temp:  [97.7 F (36.5 C)-99 F (37.2 C)] 99 F (37.2 C) (07/11 0800) Pulse Rate:  [77-82] 79 (07/11 0800) Resp:  [12-23] 12 (07/11 0811) BP: (110-130)/(68-88) 110/70 mmHg (07/11 0800) SpO2:  [98 %-100 %] 100 % (07/11 0811) Weight:  [86.4 kg (190 lb 7.6 oz)] 86.4 kg (190 lb 7.6 oz) (07/11 0420)   INTAKE / OUTPUT: Intake/Output     07/10 0701 - 07/11 0700 07/11 0701 - 07/12 0700   P.O. 1320 360   I.V. (mL/kg) 520.5 (6) 20 (0.2)   IV Piggyback     Total Intake(mL/kg) 1840.5 (21.3) 380 (4.4)   Urine (mL/kg/hr) 1850 (0.9)    Chest Tube 220 (0.1) 50 (0.2)   Total Output 2070 50   Net -229.5  +330        Urine Occurrence 3 x      PHYSICAL EXAMINATION: General: NAD, significant sub cut air, up to chair Neuro: no focal deficits HEENT: WNL, cushingoid facies Cardiovascular: RRR s M Lungs:  Crepitus across chest, no wheezes Abdomen: soft, NT, NABS Ext: warm, No edema  LABS:  Recent Labs Lab 07/19/12 0535 07/20/12 0440 07/21/12 0424  NA 141 138 138  K 3.7 4.0 3.8  CL 103 100 99  CO2 32 36* 35*  BUN 12 10 7   CREATININE 1.01 0.91 0.82  GLUCOSE 88 95 153*    Recent Labs Lab 07/19/12 0535 07/20/12 0440 07/21/12 0424  HGB 10.5* 10.4* 10.2*  HCT 34.2* 33.4* 33.1*  WBC 6.3 6.1 6.2  PLT 287 291 333    Recent Labs Lab 07/18/12 0410 07/19/12 0535 07/20/12 0440 07/21/12 0424  WBC 8.8 6.3 6.1 6.2    Recent Labs Lab 07/20/12 0724 07/20/12 1517 07/20/12 1518 07/20/12 2344 07/21/12 0732  GLUCAP 107* 244* 207* 85 80   Chest tubes: No air leak on my eval 7/11 CXR: 7/10 > L infiltrate greater than his baseline but continues to improve, no obvious PTX, chest tubes in place .   ASSESSMENT / PLAN:  Acute on Chronic Respiratory failure Left PTX c/b extensive subq emphysema - s/p L bleb stapling 7/7 CAP vs purulent bronchitis, treated Pulm sarcoidosis, has been off daily steroids as of March 2014 OSA, usually  on CPAP  P:   Cont supplemental O2 Cont BDs, Flovent (as substitute for QVAR) TCTS following chest tubes, goal d/c anterior CT 7/11 Completed levaquin course Wean pred to off if possible, currently on 10mg  qd > decrease to 5mg  on 7/11 CPAP is on hold to avoid positive pressure and promote healing. Will need to start back at some point, ? Before all chest tubes are d/c'd to insure he doesn't cause an airleak. Will discuss timing w Dr Laneta Simmers before tubes come out  HTN AF > NSR P:  Cont PO dilt and metoprolol, dilt was new in setting A fib this admission Cont PRN hydralazine  Anemia   P:  Monitor CBC intermittently  Constipation -   Esophagitis  P:  Cont bowel regimen Cont pepcid    I have personally obtained a history, examined the patient, evaluated laboratory and imaging results, formulated the assessment and plan and placed orders.  Levy Pupa, MD, PhD 07/21/2012, 9:50 AM [facility] [phone] or if no answer [phone]

## 2012-07-21 NOTE — Progress Notes (Addendum)
      [address]       [name] [address]             [phone]      4 Days Post-Op Procedure(s) (LRB): VIDEO ASSISTED THORACOSCOPY (VATS)/BLEB Stapling (Left)  Subjective:  [name] is feeling better today.  He does have some indigestion, but his pain has been under better control and he is moving around better.   Objective: Vital signs in last 24 hours: Temp:  [97.7 F (36.5 C)-99 F (37.2 C)] 99 F (37.2 C) (07/11 0800) Pulse Rate:  [77-82] 79 (07/11 0800) Cardiac Rhythm:  [-] Normal sinus rhythm (07/11 0815) Resp:  [12-23] 12 (07/11 0811) BP: (110-130)/(68-88) 110/70 mmHg (07/11 0800) SpO2:  [98 %-100 %] 100 % (07/11 0811) Weight:  [190 lb 7.6 oz (86.4 kg)] 190 lb 7.6 oz (86.4 kg) (07/11 0420)  Intake/Output from previous day: 07/10 0701 - 07/11 0700 In: 1840.5 [P.O.:1320; I.V.:520.5] Out: 2070 [Urine:1850; Chest Tube:220] Intake/Output this shift: Total I/O In: 380 [P.O.:360; I.V.:20] Out: 50 [Chest Tube:50]  General appearance: alert, cooperative and no distress Heart: regular rate and rhythm Lungs: clear to auscultation bilaterally Wound: clean and dry  Lab Results:  Recent Labs  07/20/12 0440 07/21/12 0424  WBC 6.1 6.2  HGB 10.4* 10.2*  HCT 33.4* 33.1*  PLT 291 333   BMET:  Recent Labs  07/20/12 0440 07/21/12 0424  NA 138 138  K 4.0 3.8  CL 100 99  CO2 36* 35*  GLUCOSE 95 153*  BUN 10 7  CREATININE 0.91 0.82  CALCIUM 8.8 9.0    PT/INR: No results found for this basename: LABPROT, INR,  in the last 72 hours ABG    Component Value Date/Time   PHART 7.421 07/18/2012 0435   HCO3 32.8* 07/18/2012 0435   TCO2 34 07/18/2012 0435   ACIDBASEDEF 4.5* 07/11/2012 0406   O2SAT 98.0 07/18/2012 0435   CBG (last 3)   Recent Labs  07/20/12 1518 07/20/12 2344 07/21/12 0732  GLUCAP 207* 85 80    Assessment/Plan: S/P Procedure(s) (LRB): VIDEO ASSISTED THORACOSCOPY (VATS)/BLEB Stapling (Left)  1. Chest tube- no air leak  appreciated, CT output decreased only 50 cc out yesterday, will d/c Anterior chest tube, leave posterior tube in place on suction if no air leak may be able to put on water seal tomorrow 2. Pain control- improved continue current regimen 3. Repeat CXR in AM 4. Plan per primary   LOS: 11 days    Lowella Dandy 07/21/2012   Chart reviewed, patient examined, agree with above. One of chest tubes removed today. If there is no air leak tomorrow can put the other tube to water seal. I would leave it on water seal for 48 hrs before removing it.

## 2012-07-21 NOTE — Progress Notes (Signed)
Anterior CT d/c'd per MD order.  Pt tolerated well.  Roselie Awkward, RN

## 2012-07-22 ENCOUNTER — Inpatient Hospital Stay (HOSPITAL_COMMUNITY): Payer: 59

## 2012-07-22 LAB — GLUCOSE, CAPILLARY
Glucose-Capillary: 100 mg/dL — ABNORMAL HIGH (ref 70–99)
Glucose-Capillary: 106 mg/dL — ABNORMAL HIGH (ref 70–99)

## 2012-07-22 MED ORDER — PANTOPRAZOLE SODIUM 40 MG PO TBEC
40.0000 mg | DELAYED_RELEASE_TABLET | Freq: Every day | ORAL | Status: DC
  Administered 2012-07-22 – 2012-07-23 (×2): 40 mg via ORAL
  Filled 2012-07-22 (×2): qty 1

## 2012-07-22 MED ORDER — CALCIUM CARBONATE ANTACID 500 MG PO CHEW
1.0000 | CHEWABLE_TABLET | ORAL | Status: DC | PRN
  Administered 2012-07-23 – 2012-07-30 (×18): 200 mg via ORAL
  Filled 2012-07-22 (×15): qty 1

## 2012-07-22 NOTE — Progress Notes (Signed)
Patient has been experiencing heartburn most of the day. Has received all med's. That are ordered, and not experiencing any other pain or discomfort. Will continue to monitor patient closely.

## 2012-07-22 NOTE — Progress Notes (Signed)
PULMONARY  / CRITICAL CARE MEDICINE  Name: [name] MRN: [id number] DOB: 04-05-1965    ADMISSION DATE:  07/10/2012 CONSULTATION DATE:  07/10/12  REFERRING MD :  [name] PRIMARY SERVICE: PCCM  CHIEF COMPLAINT:  Acute resp failure, tension ptx in setting advanced sarcoidosis  BRIEF PATIENT DESCRIPTION: 47 y/o male with PMH of sarcoidosis, OSA, HTN, and home O2 use who came into [facility] with ARF r/t tension pneumothorax.  Patient was intubated and chest tube placed by [name]>  SIGNIFICANT EVENTS / STUDIES:  6/17 ct chest: 10.1 x 7.8 cm thin-walled cavitary/bullous lesion in the posterior right upper lobe, unchanged. Confluent perihilar opacities with traction bronchiectasis and architectural distortion, related to known sarcoidosis. Associated peribronchovascular nodularity bilaterally. 6/30 -  tension PTX, resp failure, chest tube placed, intubated. 7/1 -  Extubated 7/7 - bleb stapling on L  LINES / TUBES: 6/30 ETT >> 7/1 6/30 L chest tube >> 7/7 7/7 L chest tubes x 2 (Dr Laneta Simmers) >>  6/30 L subclavian CVC >>   CULTURES: 6/30 resp>>> abundant staph, abundant pseudomonas 6/30 BC >> NEG  ANTIBIOTICS: Ceftazidime 3/30>>>7/2 vanc 6/30>>>7/1 levaquin 7/2>>> d/ced Cefuroxime 7/7 >> post-op only  SUBJECTIVE:  In chair with no increased wob.  C/o left sided chest discomfort.  Cough noted overnight  VITAL SIGNS: Temp:  [97.3 F (36.3 C)-98.7 F (37.1 C)] 98 F (36.7 C) (07/12 1125) Pulse Rate:  [72-90] 90 (07/12 1037) Resp:  [3-19] 14 (07/12 0800) BP: (112-130)/(69-98) 114/73 mmHg (07/12 0800) SpO2:  [93 %-100 %] 100 % (07/12 0900) Weight:  [85.548 kg (188 lb 9.6 oz)] 85.548 kg (188 lb 9.6 oz) (07/12 0437)     PHYSICAL EXAMINATION: General: NAD, significant sub cut air, up to chair Neuro: alert, oriented, moves all 4.  HEENT:nose without purulence or d/c noted.  No tmg or LN Cardiovascular: RRR  Lungs:  Crepitus across chest, no wheezes, mild crackles in L lung  Abdomen: soft,  NT, NABS Ext: warm, No edema, no cyanosis  LABS:  Recent Labs Lab 07/19/12 0535 07/20/12 0440 07/21/12 0424  NA 141 138 138  K 3.7 4.0 3.8  CL 103 100 99  CO2 32 36* 35*  BUN 12 10 7   CREATININE 1.01 0.91 0.82  GLUCOSE 88 95 153*    Recent Labs Lab 07/19/12 0535 07/20/12 0440 07/21/12 0424  HGB 10.5* 10.4* 10.2*  HCT 34.2* 33.4* 33.1*  WBC 6.3 6.1 6.2  PLT 287 291 333    Recent Labs Lab 07/18/12 0410 07/19/12 0535 07/20/12 0440 07/21/12 0424  WBC 8.8 6.3 6.1 6.2    Recent Labs Lab 07/20/12 2344 07/21/12 0732 07/21/12 1611 07/22/12 0002 07/22/12 0804  GLUCAP 85 80 120* 106* 100*   Chest tubes: No air leak on my eval 7/11 CXR: 7/10 > L infiltrate greater than his baseline but continues to improve, no obvious PTX, chest tubes in place .   ASSESSMENT / PLAN:  Acute on Chronic Respiratory failure Left PTX c/b extensive subq emphysema - s/p L bleb stapling 7/7 CAP vs purulent bronchitis, treated Pulm sarcoidosis, has been off daily steroids as of March 2014 OSA, usually on CPAP  P:   Cont supplemental O2 Cont BDs, Flovent (as substitute for QVAR) TCTS following chest tubes, no ptx on cxr 7/12 Completed levaquin course Wean pred to off if possible, currently on 5mg  CPAP is on hold to avoid positive pressure and promote healing. Will need to start back at some point, ? Before all chest tubes are d/c'd to  insure he doesn't cause an airleak. Will discuss timing w Dr Laneta Simmers before tubes come out  HTN AF > NSR P:  Cont PO dilt and metoprolol, dilt was new in setting A fib this admission Cont PRN hydralazine  Anemia   P:  Monitor CBC intermittently

## 2012-07-22 NOTE — Progress Notes (Signed)
eLink Physician-Brief Progress Note Patient Name: [name] DOB: 1965/02/17 MRN: [id number]  Date of Service  07/22/2012   HPI/Events of Note  Pt having significant heart burn   eICU Interventions  Changed pepcid to protonix added tums   Intervention Category Intermediate Interventions: Pain - evaluation and management  Atha Muradyan K. 07/22/2012, 9:58 PM

## 2012-07-22 NOTE — Progress Notes (Addendum)
      [address]       [name] [address]             [phone]      5 Days Post-Op Procedure(s) (LRB): VIDEO ASSISTED THORACOSCOPY (VATS)/BLEB Stapling (Left)  Subjective:  Mr. Sumlin states he feels worse today.  He states he coughed a lot yesterday evening after his chest tube removal.  Objective: Vital signs in last 24 hours: Temp:  [97.3 F (36.3 C)-98.7 F (37.1 C)] 98.6 F (37 C) (07/12 0800) Pulse Rate:  [72-88] 88 (07/12 0800) Cardiac Rhythm:  [-] Normal sinus rhythm (07/12 0400) Resp:  [3-19] 14 (07/12 0800) BP: (112-130)/(69-98) 114/73 mmHg (07/12 0800) SpO2:  [93 %-100 %] 100 % (07/12 0900) Weight:  [188 lb 9.6 oz (85.548 kg)] 188 lb 9.6 oz (85.548 kg) (07/12 0437)  Intake/Output from previous day: 07/11 0701 - 07/12 0700 In: 1560 [P.O.:1080; I.V.:480] Out: 675 [Urine:525; Chest Tube:150]   General appearance: alert, cooperative and no distress Heart: regular rate and rhythm Lungs: clear to auscultation bilaterally Wound: clean and dry  Lab Results:  Recent Labs  07/20/12 0440 07/21/12 0424  WBC 6.1 6.2  HGB 10.4* 10.2*  HCT 33.4* 33.1*  PLT 291 333   BMET:  Recent Labs  07/20/12 0440 07/21/12 0424  NA 138 138  K 4.0 3.8  CL 100 99  CO2 36* 35*  GLUCOSE 95 153*  BUN 10 7  CREATININE 0.91 0.82  CALCIUM 8.8 9.0    PT/INR: No results found for this basename: LABPROT, INR,  in the last 72 hours ABG    Component Value Date/Time   PHART 7.421 07/18/2012 0435   HCO3 32.8* 07/18/2012 0435   TCO2 34 07/18/2012 0435   ACIDBASEDEF 4.5* 07/11/2012 0406   O2SAT 98.0 07/18/2012 0435   CBG (last 3)   Recent Labs  07/21/12 1611 07/22/12 0002 07/22/12 0804  GLUCAP 120* 106* 100*    Assessment/Plan: S/P Procedure(s) (LRB): VIDEO ASSISTED THORACOSCOPY (VATS)/BLEB Stapling (Left)  1. Chest tube- no air leak this morning, CXR shows no significant pneumothorax- will place chest tube to water seal, repeat CXR in AM 2. Dispo- care  per primary   LOS: 12 days    BARRETT, ERIN 07/22/2012  I have seen and examined the patient and agree with the assessment and plan as outlined.  Tayten Heber H 07/22/2012 11:43 AM

## 2012-07-23 ENCOUNTER — Inpatient Hospital Stay (HOSPITAL_COMMUNITY): Payer: 59

## 2012-07-23 DIAGNOSIS — D649 Anemia, unspecified: Secondary | ICD-10-CM

## 2012-07-23 LAB — CLOSTRIDIUM DIFFICILE BY PCR: Toxigenic C. Difficile by PCR: NEGATIVE

## 2012-07-23 LAB — CBC WITH DIFFERENTIAL/PLATELET
Basophils Absolute: 0 10*3/uL (ref 0.0–0.1)
Lymphocytes Relative: 11 % — ABNORMAL LOW (ref 12–46)
Lymphs Abs: 1.2 10*3/uL (ref 0.7–4.0)
Neutro Abs: 8 10*3/uL — ABNORMAL HIGH (ref 1.7–7.7)
Neutrophils Relative %: 77 % (ref 43–77)
Platelets: 451 10*3/uL — ABNORMAL HIGH (ref 150–400)
RBC: 4.67 MIL/uL (ref 4.22–5.81)
RDW: 14.4 % (ref 11.5–15.5)
WBC: 10.4 10*3/uL (ref 4.0–10.5)

## 2012-07-23 LAB — GLUCOSE, CAPILLARY
Glucose-Capillary: 112 mg/dL — ABNORMAL HIGH (ref 70–99)
Glucose-Capillary: 99 mg/dL (ref 70–99)

## 2012-07-23 MED ORDER — LOPERAMIDE HCL 2 MG PO CAPS
2.0000 mg | ORAL_CAPSULE | ORAL | Status: DC | PRN
  Administered 2012-07-23 (×3): 2 mg via ORAL
  Filled 2012-07-23 (×3): qty 1

## 2012-07-23 MED ORDER — PANTOPRAZOLE SODIUM 40 MG PO TBEC
40.0000 mg | DELAYED_RELEASE_TABLET | Freq: Two times a day (BID) | ORAL | Status: DC
  Administered 2012-07-23 – 2012-08-04 (×24): 40 mg via ORAL
  Filled 2012-07-23 (×21): qty 1

## 2012-07-23 NOTE — Progress Notes (Signed)
PULMONARY  / CRITICAL CARE MEDICINE  Name: [name] MRN: [id number] DOB: [birth year]    ADMISSION DATE:  07/10/2012 CONSULTATION DATE:  07/10/12  REFERRING MD :  [name] PRIMARY SERVICE: PCCM  CHIEF COMPLAINT:  Acute resp failure, tension ptx in setting advanced sarcoidosis  BRIEF PATIENT DESCRIPTION: 47 y/o male with PMH of sarcoidosis, OSA, HTN, and home O2 use who came into [facility] with ARF r/t tension pneumothorax.  Patient was intubated and chest tube placed by [name]>  SIGNIFICANT EVENTS / STUDIES:  6/17 ct chest: 10.1 x 7.8 cm thin-walled cavitary/bullous lesion in the posterior right upper lobe, unchanged. Confluent perihilar opacities with traction bronchiectasis and architectural distortion, related to known sarcoidosis. Associated peribronchovascular nodularity bilaterally. 6/30 -  tension PTX, resp failure, chest tube placed, intubated. 7/1 -  Extubated 7/7 - bleb stapling on L  LINES / TUBES: 6/30 ETT >> 7/1 6/30 L chest tube >> 7/7 7/7 L chest tubes x 2 (Dr Laneta Simmers) >>  6/30 L subclavian CVC >>   CULTURES: 6/30 resp>>> abundant staph, abundant pseudomonas 6/30 BC >> NEG 7/13 Cdiff >>neg  ANTIBIOTICS: Ceftazidime 3/30>>>7/2 vanc 6/30>>>7/1 levaquin 7/2>>> d/ced Cefuroxime 7/7 >> post-op only  SUBJECTIVE:  In chair with no increased wob.  C/o reflux symptoms and loose stools.  Now on PPI, and Cdiff negative.  Has immodium prn.   VITAL SIGNS: Temp:  [97.7 F (36.5 C)-99 F (37.2 C)] 98.4 F (36.9 C) (07/13 1130) Pulse Rate:  [73-85] 76 (07/13 1130) Resp:  [11-22] 11 (07/13 1130) BP: (105-141)/(74-103) 118/79 mmHg (07/13 1130) SpO2:  [87 %-100 %] 100 % (07/13 1130)     PHYSICAL EXAMINATION: General: NAD, significant sub cut air, up to chair Neuro: alert, oriented, moves all 4.  HEENT:nose without purulence or d/c noted.  No tmg or LN Cardiovascular: RRR  Lungs:  Crepitus across chest, no wheezes, mild crackles in L lung  Abdomen: soft, NT, NABS Ext: warm,  No edema, no cyanosis  LABS:  Recent Labs Lab 07/19/12 0535 07/20/12 0440 07/21/12 0424  NA 141 138 138  K 3.7 4.0 3.8  CL 103 100 99  CO2 32 36* 35*  BUN 12 10 7   CREATININE 1.01 0.91 0.82  GLUCOSE 88 95 153*    Recent Labs Lab 07/20/12 0440 07/21/12 0424 07/23/12 0418  HGB 10.4* 10.2* 11.7*  HCT 33.4* 33.1* 38.4*  WBC 6.1 6.2 10.4  PLT 291 333 451*    Recent Labs Lab 07/19/12 0535 07/20/12 0440 07/21/12 0424 07/23/12 0418  WBC 6.3 6.1 6.2 10.4    Recent Labs Lab 07/22/12 0002 07/22/12 0804 07/22/12 1548 07/23/12 0006 07/23/12 0724  GLUCAP 106* 100* 99 112* 99    ASSESSMENT / PLAN:  Acute on Chronic Respiratory failure Left PTX c/b extensive subq emphysema - s/p L bleb stapling 7/7 CAP vs purulent bronchitis, treated Pulm sarcoidosis, has been off daily steroids as of March 2014 OSA, usually on CPAP  P:   Cont supplemental O2 Cont BDs, Flovent (as substitute for QVAR) TCTS following chest tubes, +air leak today with ptx on cxr. Completed levaquin course Wean pred to off if possible, currently on 5mg  CPAP is on hold to avoid positive pressure and promote healing. Will need to start back at some point, ? Before all chest tubes are d/c'd to insure he doesn't cause an airleak. Will discuss timing w Dr Laneta Simmers before tubes come out  HTN AF > NSR P:  Cont PO dilt and metoprolol, dilt was new in setting  A fib this admission Cont PRN hydralazine  Anemia   P:  Monitor CBC intermittently

## 2012-07-23 NOTE — Progress Notes (Addendum)
      [address]       [name] [address]             [phone]      6 Days Post-Op Procedure(s) (LRB): VIDEO ASSISTED THORACOSCOPY (VATS)/BLEB Stapling (Left)  Subjective:  Mr. Vath had a rough night last night.  He had severe heart burn and diarrhea most of the evening.    Objective: Vital signs in last 24 hours: Temp:  [97.7 F (36.5 C)-99 F (37.2 C)] 99 F (37.2 C) (07/13 0725) Pulse Rate:  [73-90] 78 (07/13 0725) Cardiac Rhythm:  [-] Normal sinus rhythm (07/13 0350) Resp:  [11-22] 22 (07/13 0725) BP: (105-141)/(74-103) 122/83 mmHg (07/13 0725) SpO2:  [87 %-100 %] 99 % (07/13 0725)  Intake/Output from previous day: 07/12 0701 - 07/13 0700 In: 588.9 [I.V.:588.9] Out: 0  Intake/Output this shift: Total I/O In: 20 [I.V.:20] Out: 0   General appearance: alert, cooperative and no distress Heart: regular rate and rhythm Lungs: diminished breath sounds left base Abdomen: soft, non-tender; bowel sounds normal; no masses,  no organomegaly Wound: clean, serous drainage around chest tube site  Lab Results:  Recent Labs  07/21/12 0424 07/23/12 0418  WBC 6.2 10.4  HGB 10.2* 11.7*  HCT 33.1* 38.4*  PLT 333 451*   BMET:  Recent Labs  07/21/12 0424  NA 138  K 3.8  CL 99  CO2 35*  GLUCOSE 153*  BUN 7  CREATININE 0.82  CALCIUM 9.0    PT/INR: No results found for this basename: LABPROT, INR,  in the last 72 hours ABG    Component Value Date/Time   PHART 7.421 07/18/2012 0435   HCO3 32.8* 07/18/2012 0435   TCO2 34 07/18/2012 0435   ACIDBASEDEF 4.5* 07/11/2012 0406   O2SAT 98.0 07/18/2012 0435   CBG (last 3)   Recent Labs  07/22/12 0804 07/22/12 1548 07/23/12 0006  GLUCAP 100* 99 112*    Assessment/Plan: S/P Procedure(s) (LRB): VIDEO ASSISTED THORACOSCOPY (VATS)/BLEB Stapling (Left)  1. Chest tube- intermittent air leak, + air leak with cough- small basilar pneumothorax on left- chest tube currently on water seal, may need to  transition back to suction will discuss with staff 2. Diarrhea- C. Diff pending 3. Dispo- care per primary   LOS: 13 days    BARRETT, ERIN 07/23/2012  I have seen and examined the patient and agree with the assessment and plan as outlined.  I would leave tube on water seal as there has not been any significant lung collapse  Ahilyn Nell H 07/23/2012 11:25 AM

## 2012-07-23 NOTE — Progress Notes (Signed)
eLink Physician-Brief Progress Note Patient Name: [name] DOB: 1966/01/04 MRN: [id number]  Date of Service  07/23/2012   HPI/Events of Note   Diarrhea, no fever, leukocytosis or belly pain  eICU Interventions  immodium prn C. Diff pcr      Trinidad Ingle 07/23/2012, 3:58 AM

## 2012-07-24 MED ORDER — OXYCODONE HCL 5 MG PO TABS
5.0000 mg | ORAL_TABLET | ORAL | Status: DC | PRN
  Administered 2012-07-25 – 2012-08-04 (×23): 5 mg via ORAL
  Filled 2012-07-24 (×24): qty 1

## 2012-07-24 MED ORDER — ACETAMINOPHEN 325 MG PO TABS
650.0000 mg | ORAL_TABLET | ORAL | Status: DC | PRN
  Filled 2012-07-24: qty 2

## 2012-07-24 NOTE — Progress Notes (Signed)
PULMONARY  / CRITICAL CARE MEDICINE  Name: [name] MRN: [id number] DOB: Mar 21, 1965    ADMISSION DATE:  07/10/2012 CONSULTATION DATE:  07/10/12  REFERRING MD :  [name] PRIMARY SERVICE: PCCM  CHIEF COMPLAINT:  Acute resp failure, tension ptx in setting advanced sarcoidosis  BRIEF PATIENT DESCRIPTION: 47 y/o male with PMH of sarcoidosis, OSA, HTN, and home O2 use who came into [facility] with ARF r/t tension pneumothorax.  Patient was intubated and chest tube placed by [name]>  SIGNIFICANT EVENTS / STUDIES:  6/17 ct chest: 10.1 x 7.8 cm thin-walled cavitary/bullous lesion in the posterior right upper lobe, unchanged. Confluent perihilar opacities with traction bronchiectasis and architectural distortion, related to known sarcoidosis. Associated peribronchovascular nodularity bilaterally. 6/30 -  tension PTX, resp failure, chest tube placed, intubated. 7/1 -  Extubated 7/7 - VATS (Bartle) - stapling of L apical bleb, mechanical pleurodesis  LINES / TUBES: 6/30 ETT >> 7/1 6/30 L chest tube >> 7/7 7/7 L chest tubes (Dr Laneta Simmers) >>  6/30 L subclavian CVC >>   CULTURES: 6/30 resp>>> abundant staph, abundant pseudomonas 6/30 BC >> NEG 7/13 Cdiff >>neg  ANTIBIOTICS: Ceftazidime 3/30>>>7/2 vanc 6/30>>>7/1 levaquin 7/2>>> d/ced Cefuroxime 7/7 >> post-op only  SUBJECTIVE:  No new complaints. No distress.    VITAL SIGNS: Temp:  [97.2 F (36.2 C)-98.1 F (36.7 C)] 97.2 F (36.2 C) (07/14 1200) Pulse Rate:  [65-154] 99 (07/14 1025) Resp:  [15-21] 21 (07/14 0800) BP: (100-152)/(66-115) 152/89 mmHg (07/14 1200) SpO2:  [68 %-100 %] 92 % (07/14 1200) FiO2 (%):  [30 %] 30 % (07/14 0800)     PHYSICAL EXAMINATION: General: NAD, no distress Neuro: alert, oriented, moves all 4.  HEENT: WNL Cardiovascular: RRR s M Lungs:  Crepitus across chest, no wheezes, bilateral crackles  Abdomen: soft, NT, NABS Ext: warm, No edema, no cyanosis  LABS:  Recent Labs Lab 07/19/12 0535 07/20/12 0440  07/21/12 0424  NA 141 138 138  K 3.7 4.0 3.8  CL 103 100 99  CO2 32 36* 35*  BUN 12 10 7   CREATININE 1.01 0.91 0.82  GLUCOSE 88 95 153*    Recent Labs Lab 07/20/12 0440 07/21/12 0424 07/23/12 0418  HGB 10.4* 10.2* 11.7*  HCT 33.4* 33.1* 38.4*  WBC 6.1 6.2 10.4  PLT 291 333 451*    Recent Labs Lab 07/19/12 0535 07/20/12 0440 07/21/12 0424 07/23/12 0418  WBC 6.3 6.1 6.2 10.4    Recent Labs Lab 07/22/12 0002 07/22/12 0804 07/22/12 1548 07/23/12 0006 07/23/12 0724  GLUCAP 106* 100* 99 112* 99   CXR: NSC    ASSESSMENT / PLAN:  Acute on Chronic Respiratory failure ES fibrocavitary sarcoidosis, has been off daily steroids as of March 2014 Left PTX c/b extensive subq emphysema - s/p L bleb stapling 7/7 CAP vs purulent bronchitis, treated OSA, usually on CPAP  P:   Cont supplemental O2 Cont BDs, Flovent (as substitute for QVAR) TCTS managing chest tubes and post op issues Completed levaquin course Wean pred to off if possible, currently on 5mg  CPAP is on hold to avoid positive pressure and promote healing. Will need to start back at some point, ? Before all chest tubes are d/c'd to insure he doesn't cause an airleak. Will discuss timing w Dr Laneta Simmers before tubes come out  HTN AF > NSR P:  Cont metoprolol D/C diltiazem Cont PRN hydralazine  Anemia   P:  Monitor CBC intermittently  Billy Fischer, MD ; [facility] service Mobile [phone].  After 5:30 PM  or weekends, call [phone]

## 2012-07-24 NOTE — Progress Notes (Addendum)
TCTS DAILY ICU PROGRESS NOTE                   [address]            [place] [address]          [phone]   7 Days Post-Op Procedure(s) (LRB): VIDEO ASSISTED THORACOSCOPY (VATS)/BLEB Stapling (Left)  Total Length of Stay:  LOS: 14 days   Subjective:  feels ok. Cdiff neg, diarrhea is better  Objective: Vital signs in last 24 hours: Temp:  [97.6 F (36.4 C)-98.1 F (36.7 C)] 97.6 F (36.4 C) (07/14 0800) Pulse Rate:  [65-154] 99 (07/14 1025) Cardiac Rhythm:  [-] Normal sinus rhythm (07/14 0900) Resp:  [15-21] 21 (07/14 0800) BP: (100-131)/(66-115) 112/76 mmHg (07/14 1025) SpO2:  [68 %-100 %] 99 % (07/14 0726) FiO2 (%):  [30 %] 30 % (07/14 0800)  Filed Weights   07/19/12 0500 07/21/12 0420 07/22/12 0437  Weight: 190 lb 9.6 oz (86.456 kg) 190 lb 7.6 oz (86.4 kg) 188 lb 9.6 oz (85.548 kg)    Weight change:    Hemodynamic parameters for last 24 hours:    Intake/Output from previous day: 07/13 0701 - 07/14 0700 In: 885 [P.O.:705; I.V.:180] Out: 700 [Urine:700]  Intake/Output this shift: Total I/O In: 240 [P.O.:240] Out: 195 [Urine:175; Chest Tube:20]  Current Meds: Scheduled Meds: . amitriptyline  50 mg Oral QHS  . bisacodyl  10 mg Oral Daily  . cholecalciferol  1,000 Units Oral q morning - 10a  . enoxaparin (LOVENOX) injection  40 mg Subcutaneous Q24H  . fluticasone  2 puff Inhalation BID  . levalbuterol  0.63 mg Nebulization Q6H  . metoprolol tartrate  50 mg Oral BID  . pantoprazole  40 mg Oral BID  . polyethylene glycol  17 g Oral Daily  . predniSONE  5 mg Oral Q breakfast  . senna-docusate  2 tablet Oral BID   Continuous Infusions:  PRN Meds:.sodium chloride, acetaminophen, alum & mag hydroxide-simeth, bisacodyl, calcium carbonate, diphenhydrAMINE, hydrALAZINE, levalbuterol, loperamide, LORazepam, metoprolol, ondansetron (ZOFRAN) IV, oxyCODONE, oxyCODONE-acetaminophen, potassium chloride, sodium chloride, sodium chloride  General  appearance: alert, cooperative and no distress Heart: regular rate and rhythm Lungs: mild scattered wheeze Wound: serosang drainage  Lab Results: CBC: Recent Labs  07/23/12 0418  WBC 10.4  HGB 11.7*  HCT 38.4*  PLT 451*   BMET: No results found for this basename: NA, K, CL, CO2, GLUCOSE, BUN, CREATININE, CALCIUM,  in the last 72 hours  PT/INR: No results found for this basename: LABPROT, INR,  in the last 72 hours Radiology: Dg Chest Port 1 View  07/23/2012   *RADIOLOGY REPORT*  Clinical Data: Sarcoidosis.  follow-up pneumothorax.  PORTABLE CHEST - 1 VIEW  Comparison: 07/22/2012  Findings: The left subclavian center venous catheter remains in appropriate position.  Left chest tube remains in place.  A tiny left basilar pneumothorax is seen, with extensive subcutaneous emphysema, left side greater than right.  A small approximately 10% right apical pneumothorax is also seen, but without significant change.  Bilateral lower lung infiltrates show no significant change.  Heart size is normal.  IMPRESSION:  1. Stable small approximately 10% right apical pneumothorax. 2.  Tiny left basilar pneumothorax with left chest tube in place. 3.  Stable bilateral lower lung infiltrates.   Original Report Authenticated By: Myles Rosenthal, M.D.     Assessment/Plan: S/P Procedure(s) (LRB): VIDEO ASSISTED THORACOSCOPY (VATS)/BLEB Stapling (Left)  1 CXR not done yet, No air leak  on suction. Will see what xray shows and poss go back to H2O seal.    GOLD,[name] 07/24/2012 12:37 PM He didn't have a chest xray today but I don't see any airleak or tidaling. Will put to water seal. Check cxr in am.

## 2012-07-24 NOTE — Progress Notes (Signed)
Pt c/o difficulty breathing. O2 sats 95% on 3LNC. RT called to give breathing treatment. Pt also has increased anxiety. Given Ativan IV along with breathing treatment from RT. Patient calmed down and breathing improved. Will continue to monitor.

## 2012-07-25 ENCOUNTER — Inpatient Hospital Stay (HOSPITAL_COMMUNITY): Payer: 59

## 2012-07-25 LAB — CBC
MCH: 25.2 pg — ABNORMAL LOW (ref 26.0–34.0)
MCHC: 31.2 g/dL (ref 30.0–36.0)
MCV: 80.9 fL (ref 78.0–100.0)
Platelets: 386 10*3/uL (ref 150–400)
RBC: 4.24 MIL/uL (ref 4.22–5.81)
RDW: 14 % (ref 11.5–15.5)

## 2012-07-25 LAB — MAGNESIUM: Magnesium: 1.9 mg/dL (ref 1.5–2.5)

## 2012-07-25 LAB — TROPONIN I: Troponin I: <0.3 ng/mL (ref <0.30)

## 2012-07-25 MED ORDER — DM-GUAIFENESIN ER 30-600 MG PO TB12
2.0000 | ORAL_TABLET | Freq: Two times a day (BID) | ORAL | Status: DC
  Administered 2012-07-25 – 2012-08-04 (×21): 2 via ORAL
  Filled 2012-07-25 (×22): qty 2

## 2012-07-25 MED ORDER — PHENOL 1.4 % MT LIQD: 1.0000 | OROMUCOSAL | Status: DC | PRN

## 2012-07-25 MED ORDER — BUDESONIDE 0.5 MG/2ML IN SUSP
0.5000 mg | Freq: Two times a day (BID) | RESPIRATORY_TRACT | Status: DC
  Administered 2012-07-25 (×2): 0.5 mg via RESPIRATORY_TRACT
  Filled 2012-07-25 (×2): qty 4
  Filled 2012-07-25: qty 2
  Filled 2012-07-25: qty 4

## 2012-07-25 MED ORDER — BUDESONIDE 0.5 MG/2ML IN SUSP
0.2500 mg | Freq: Two times a day (BID) | RESPIRATORY_TRACT | Status: DC
  Administered 2012-07-26 (×2): 0.25 mg via RESPIRATORY_TRACT
  Filled 2012-07-25 (×9): qty 2

## 2012-07-25 NOTE — Progress Notes (Addendum)
      [address]       [name] [address]             [phone]      8 Days Post-Op Procedure(s) (LRB): VIDEO ASSISTED THORACOSCOPY (VATS)/BLEB Stapling (Left)  Subjective:  [name] is without complaints this morning.  No further diarrhea.   Objective: Vital signs in last 24 hours: Temp:  [97.2 F (36.2 C)-98.6 F (37 C)] 98.4 F (36.9 C) (07/15 0815) Pulse Rate:  [79-99] 84 (07/15 0332) Cardiac Rhythm:  [-] Normal sinus rhythm (07/14 1937) Resp:  [13-25] 17 (07/15 0332) BP: (110-152)/(72-89) 110/82 mmHg (07/15 0332) SpO2:  [92 %-100 %] 100 % (07/15 0332)  Intake/Output from previous day: 07/14 0701 - 07/15 0700 In: 960 [P.O.:960] Out: 1275 [Urine:1225; Chest Tube:50]  General appearance: alert, cooperative and no distress Heart: regular rate and rhythm Lungs: clear to auscultation bilaterally Wound: clean and dry  Lab Results:  Recent Labs  07/23/12 0418 07/25/12 0309  WBC 10.4 7.3  HGB 11.7* 10.7*  HCT 38.4* 34.3*  PLT 451* 386   BMET: No results found for this basename: NA, K, CL, CO2, GLUCOSE, BUN, CREATININE, CALCIUM,  in the last 72 hours  PT/INR: No results found for this basename: LABPROT, INR,  in the last 72 hours ABG    Component Value Date/Time   PHART 7.421 07/18/2012 0435   HCO3 32.8* 07/18/2012 0435   TCO2 34 07/18/2012 0435   ACIDBASEDEF 4.5* 07/11/2012 0406   O2SAT 98.0 07/18/2012 0435   CBG (last 3)   Recent Labs  07/22/12 1548 07/23/12 0006 07/23/12 0724  GLUCAP 99 112* 99    Assessment/Plan: S/P Procedure(s) (LRB): VIDEO ASSISTED THORACOSCOPY (VATS)/BLEB Stapling (Left)  1. Chest tube- no air leak this morning, CXR is stable with no evidence of pneumothorax- we will leave chest tube in place today on water seal 2. Repeat CXR in AM 3. Care per primary   LOS: 15 days    BARRETT, ERIN 07/25/2012  CXR looks good and no air leak. If CXR unchanged in am we can remove the chest tube and watch for a day and  plan to send home Thursday.

## 2012-07-25 NOTE — Progress Notes (Signed)
PULMONARY  / CRITICAL CARE MEDICINE  Name: [name] MRN: [id number] DOB: 01/14/1965    ADMISSION DATE:  07/10/2012 CONSULTATION DATE:  07/10/12  REFERRING MD :  [name] PRIMARY SERVICE: PCCM  CHIEF COMPLAINT:  Acute resp failure, tension ptx in setting advanced sarcoidosis  BRIEF PATIENT DESCRIPTION: 47 y/o male with PMH of sarcoidosis, OSA, HTN, and home O2 use who came into [facility] with ARF r/t tension pneumothorax.  Patient was intubated and chest tube placed by [name]>  SIGNIFICANT EVENTS / STUDIES:  6/17 ct chest: 10.1 x 7.8 cm thin-walled cavitary/bullous lesion in the posterior right upper lobe, unchanged. Confluent perihilar opacities with traction bronchiectasis and architectural distortion, related to known sarcoidosis. Associated peribronchovascular nodularity bilaterally. 6/30 -  tension PTX, resp failure, chest tube placed, intubated. 7/1 -  Extubated 7/7 - VATS (Bartle) - stapling of L apical bleb, mechanical pleurodesis 7/15 8 beat run of VT, asymptomatic   LINES / TUBES: 6/30 ETT >> 7/1 6/30 L chest tube >> 7/7 7/7 L chest tubes (Dr Laneta Simmers) >>  6/30 L subclavian CVC >> 7/15  CULTURES: 6/30 resp>>> abundant staph, abundant pseudomonas 6/30 BC >> NEG 7/13 Cdiff >>neg  ANTIBIOTICS: Ceftazidime 3/30>>>7/2 vanc 6/30>>>7/1 levaquin 7/2>>> d/ced Cefuroxime 7/7 >> post-op only  SUBJECTIVE:  No new complaints. No distress.    VITAL SIGNS: Temp:  [97.9 F (36.6 C)-98.6 F (37 C)] 98 F (36.7 C) (07/15 1603) Pulse Rate:  [80-109] 103 (07/15 0944) Resp:  [15-25] 15 (07/15 0815) BP: (110-128)/(80-90) 119/80 mmHg (07/15 1200) SpO2:  [94 %-100 %] 94 % (07/15 1505) FiO2 (%):  [28 %] 28 % (07/15 1505)   PHYSICAL EXAMINATION: General: NAD, no distress Neuro: alert, oriented, moves all 4.  HEENT: WNL Cardiovascular: RRR s M Lungs: no wheezes, bilateral crackles  Abdomen: soft, NT, NABS Ext: warm, No edema, no cyanosis  LABS:  Recent Labs Lab 07/19/12 0535  07/20/12 0440 07/21/12 0424  NA 141 138 138  K 3.7 4.0 3.8  CL 103 100 99  CO2 32 36* 35*  BUN 12 10 7   CREATININE 1.01 0.91 0.82  GLUCOSE 88 95 153*    Recent Labs Lab 07/21/12 0424 07/23/12 0418 07/25/12 0309  HGB 10.2* 11.7* 10.7*  HCT 33.1* 38.4* 34.3*  WBC 6.2 10.4 7.3  PLT 333 451* 386    Recent Labs Lab 07/20/12 0440 07/21/12 0424 07/23/12 0418 07/25/12 0309  WBC 6.1 6.2 10.4 7.3    Recent Labs Lab 07/22/12 0002 07/22/12 0804 07/22/12 1548 07/23/12 0006 07/23/12 0724  GLUCAP 106* 100* 99 112* 99   CXR: NSC    ASSESSMENT / PLAN:  Acute on Chronic Respiratory failure ES fibrocavitary sarcoidosis, has been off daily steroids as of March 2014 Left PTX c/b extensive subq emphysema  Post VATS, L apical bleb stapling/machanical pleurodesis 7/7 CAP vs purulent bronchitis, treated OSA, usually on CPAP  P:   Cont supplemental O2 Cont BDs, Flovent (as substitute for QVAR) TCTS managing chest tubes and post op issues   HTN AF > NSR NSVT P:  Cont metoprolol Cont tele Cont PRN hydralazine  Anemia   P:  Monitor CBC intermittently  Billy Fischer, MD ; [facility] service Mobile [phone].  After 5:30 PM or weekends, call [phone]

## 2012-07-25 NOTE — Care Management Note (Signed)
    Page 1 of 2   07/25/2012     3:12:15 PM   CARE MANAGEMENT NOTE 07/25/2012  Patient:  [name],[name]   Account Number:  [id number]  Date Initiated:  07/11/2012  Documentation initiated by:  DAVIS,RHONDA  Subjective/Objective Assessment:   pneumo with resp failure after bleph opened.  ctube inserted and intubated on admit.     Action/Plan:   from home   Anticipated DC Date:  07/27/2012   Anticipated DC Plan:  HOME/SELF CARE         Choice offered to / List presented to:             Status of service:  In process, will continue to follow Medicare Important Message given?  NO (If response is "NO", the following Medicare IM given date fields will be blank) Date Medicare IM given:   Date Additional Medicare IM given:    Discharge Disposition:    Per UR Regulation:  Reviewed for med. necessity/level of care/duration of stay  If discussed at Long Length of Stay Meetings, dates discussed:   07/20/2012  07/25/2012    Comments:  Contact:  Tillman Sers, SO [phone]  07/25/12- 1500- Donn Pierini RN, BSN [phone] Received referral regarding ?rehab vs HH- spoke with pt and girlfriend at bedside - per conversation pt is independently ambulating up to 6 laps around unit- pt lives alone but girlfriend states that she will be able to assist at discharge. Informed pt that with his current level of function his insurance would not qualify him for any rehab and he does not qualify for [facility] with his current level of independence. Pt is not expected to go home with chest tube and therefore will most likely not need HH.  NCM will follow if something should change- pt voiced understanding.  07/18/12- 1400- Donn Pierini RN, BSN [phone] Pt s/p VATS on 07/17/12- tx from ICU today to SDU- Chest tubes remain in place- NCM to cont. to follow pt progress post op for d/c needs-  07/18/12 1120 Verdis Prime RN MSN BSN CCM Met @ bedside with pt and SO.  Pt has O2 and CPAP through [employer], reports he uses O2  as needed.  PCP is @ [facility].  SO reports she will be available to assist pt 24/7 if needed after discharge.  [id number] Davis,RN,BSN,CCM: pt extubated on [id number], chest tube remains to suction,a,fib pm of [id number] cardizem drip started.  [id number] Earlene Plater, RN, BSN, CCM:  CHART REVIEWED AND UPDATED.  Next chart review due on [id number]. NO DISCHARGE NEEDS PRESENT AT THIS TIME. CASE MANAGEMENT [phone]

## 2012-07-25 NOTE — Progress Notes (Signed)
eLink Physician-Brief Progress Note Patient Name: [name] DOB: 09-27-1965 MRN: [id number]  Date of Service  07/25/2012   HPI/Events of Note  Per RN 8 beat NS Vtach. Aymsymptomatic. Normal vitals   eICU Interventions  Troponin check bmet Cbc Mag, phos   Intervention Category Intermediate Interventions: Arrhythmia - evaluation and management  Jimmy Stipes 07/25/2012, 2:01 AM

## 2012-07-25 NOTE — Progress Notes (Signed)
Utilization review completed.  

## 2012-07-26 ENCOUNTER — Other Ambulatory Visit: Payer: Self-pay | Admitting: *Deleted

## 2012-07-26 ENCOUNTER — Inpatient Hospital Stay (HOSPITAL_COMMUNITY): Payer: 59

## 2012-07-26 DIAGNOSIS — D869 Sarcoidosis, unspecified: Secondary | ICD-10-CM

## 2012-07-26 NOTE — Progress Notes (Signed)
Patient being transferred tp [facility] per MD order. Report called to [place], Charity fundraiser. Patient will transfer in wheel chair.

## 2012-07-26 NOTE — Progress Notes (Signed)
Pt arrived to unit accompanied by RN in wheelchair. NAD, dressing clean and dry, no cx of pain. Will continue to monitor.

## 2012-07-26 NOTE — Progress Notes (Signed)
PULMONARY  / CRITICAL CARE MEDICINE  Name: [name] MRN: [id number] DOB: 1965/11/09    ADMISSION DATE:  07/10/2012 CONSULTATION DATE:  07/10/12  REFERRING MD :  [name] PRIMARY SERVICE: PCCM  CHIEF COMPLAINT:  Acute resp failure, tension ptx in setting advanced sarcoidosis  BRIEF PATIENT DESCRIPTION: 47 y/o male with PMH of sarcoidosis, OSA, HTN, and home O2 use who came into [facility] with ARF r/t tension pneumothorax.  Patient was intubated and chest tube placed by [name]>  SIGNIFICANT EVENTS / STUDIES:  6/17 ct chest: 10.1 x 7.8 cm thin-walled cavitary/bullous lesion in the posterior right upper lobe, unchanged. Confluent perihilar opacities with traction bronchiectasis and architectural distortion, related to known sarcoidosis. Associated peribronchovascular nodularity bilaterally. 6/30 -  tension PTX, resp failure, chest tube placed, intubated. 7/1 -  Extubated 7/7 - VATS (Bartle) - stapling of L apical bleb, mechanical pleurodesis 7/15 8 beat run of VT, asymptomatic 7/16 Chest tube removed  LINES / TUBES: 6/30 ETT >> 7/1 6/30 L chest tube >> 7/7 7/7 L chest tubes (Dr Laneta Simmers) >> 7/16 6/30 L subclavian CVC >> 7/15  CULTURES: 6/30 resp>>> abundant staph, abundant pseudomonas 6/30 BC >> NEG 7/13 Cdiff >>neg  ANTIBIOTICS: Ceftazidime 3/30>>>7/2 vanc 6/30>>>7/1 levaquin 7/2>>> d/ced Cefuroxime 7/7 >> post-op only  SUBJECTIVE:  No new complaints. No distress.    VITAL SIGNS: Temp:  [97.3 F (36.3 C)-98.2 F (36.8 C)] 97.4 F (36.3 C) (07/16 1200) Pulse Rate:  [80-108] 108 (07/16 1000) Resp:  [12-23] 15 (07/16 0803) BP: (105-147)/(69-92) 105/69 mmHg (07/16 1200) SpO2:  [95 %-100 %] 100 % (07/16 1502)   PHYSICAL EXAMINATION: General: NAD, no distress Neuro: alert, oriented, moves all 4.  HEENT: WNL Cardiovascular: RRR s M Lungs: no wheezes, bilateral crackles  Abdomen: soft, NT, NABS Ext: warm, No edema, no cyanosis  LABS:  Recent Labs Lab 07/20/12 0440  07/21/12 0424  NA 138 138  K 4.0 3.8  CL 100 99  CO2 36* 35*  BUN 10 7  CREATININE 0.91 0.82  GLUCOSE 95 153*    Recent Labs Lab 07/21/12 0424 07/23/12 0418 07/25/12 0309  HGB 10.2* 11.7* 10.7*  HCT 33.1* 38.4* 34.3*  WBC 6.2 10.4 7.3  PLT 333 451* 386    Recent Labs Lab 07/20/12 0440 07/21/12 0424 07/23/12 0418 07/25/12 0309  WBC 6.1 6.2 10.4 7.3    Recent Labs Lab 07/22/12 0002 07/22/12 0804 07/22/12 1548 07/23/12 0006 07/23/12 0724  GLUCAP 106* 100* 99 112* 99   CXR: No PTX after tube removal   ASSESSMENT / PLAN:  Acute on Chronic Respiratory failure ES fibrocavitary sarcoidosis, has been off daily steroids as of March 2014 Left PTX c/b extensive subq emphysema  Post VATS, L apical bleb stapling/machanical pleurodesis 7/7 CAP vs purulent bronchitis, treated OSA, usually on CPAP  P:   Cont supplemental O2 Cont BDs, Flovent (as substitute for QVAR) Repeat CXR AM 7/17. If no PTX, he wil be ready for DC home  Will need F/U with Dr Laneta Simmers of TCTS in 2 wks for suture removal  Will need to discuss with Dr Delton Coombes whether to cont low dose pred or DC  Will need F/U with [facility] in 1-2 wks after DC   HTN AF > NSR NSVT - one brief asymptomatic episode 7/15. No further W/U indicated P:  Cont metoprolol Cont PRN hydralazine  Anemia   P:  Monitor CBC intermittently  Billy Fischer, MD ; [facility] service Mobile [phone].  After 5:30 PM or weekends, call  [phone]  

## 2012-07-26 NOTE — Progress Notes (Signed)
9 Days Post-Op Procedure(s) (LRB): VIDEO ASSISTED THORACOSCOPY (VATS)/BLEB Stapling (Left) Subjective: No complaints  Objective: Vital signs in last 24 hours: Temp:  [97.3 F (36.3 C)-98.3 F (36.8 C)] 97.3 F (36.3 C) (07/16 0803) Pulse Rate:  [80-105] 105 (07/16 0803) Cardiac Rhythm:  [-] Normal sinus rhythm;Sinus tachycardia (07/16 0803) Resp:  [12-23] 15 (07/16 0803) BP: (112-147)/(78-92) 147/82 mmHg (07/16 0803) SpO2:  [94 %-100 %] 100 % (07/16 0805) FiO2 (%):  [28 %] 28 % (07/15 1505)  Hemodynamic parameters for last 24 hours:    Intake/Output from previous day: 07/15 0701 - 07/16 0700 In: 1080 [P.O.:1080] Out: 50 [Chest Tube:50] Intake/Output this shift: Total I/O In: 240 [P.O.:240] Out: -   General appearance: alert and cooperative Heart: regular rate and rhythm, S1, S2 normal, no murmur, click, rub or gallop Lungs: rhonchi bilaterally Wound: incisions ok No air leak from chest tube  Lab Results:  Recent Labs  07/25/12 0309  WBC 7.3  HGB 10.7*  HCT 34.3*  PLT 386   BMET: No results found for this basename: NA, K, CL, CO2, GLUCOSE, BUN, CREATININE, CALCIUM,  in the last 72 hours  PT/INR: No results found for this basename: LABPROT, INR,  in the last 72 hours ABG    Component Value Date/Time   PHART 7.421 07/18/2012 0435   HCO3 32.8* 07/18/2012 0435   TCO2 34 07/18/2012 0435   ACIDBASEDEF 4.5* 07/11/2012 0406   O2SAT 98.0 07/18/2012 0435   CBG (last 3)  No results found for this basename: GLUCAP,  in the last 72 hours   CXR: no ptx  Assessment/Plan: S/P Procedure(s) (LRB): VIDEO ASSISTED THORACOSCOPY (VATS)/BLEB Stapling (Left) Remove chest tube. Do follow-up cxr afterward and repeat in am. If no changes he can go home tomorrow and follow up with me in two weeks. I will remove all of the sutures in the office since he has been on prednisone.   LOS: 16 days    [name] K 07/26/2012

## 2012-07-26 NOTE — Evaluation (Signed)
Physical Therapy Evaluation Patient Details Name: [name] MRN: [id number] DOB: 1965-03-02 Today's Date: 07/26/2012 Time: 1191-4782 PT Time Calculation (min): 33 min  PT Assessment / Plan / Recommendation History of Present Illness  Pt adm with tension pneumothorax due to advanced sarcoidosis.  Underwent VATS and BLEB stapling.  Clinical Impression  Pt doing well with mobility.  Instructed to incr activity at home with daily activities and instructed pt in heel raises and mini squats.    PT Assessment  Patent does not need any further PT services    Follow Up Recommendations  No PT follow up    Does the patient have the potential to tolerate intense rehabilitation      Barriers to Discharge        Equipment Recommendations  None recommended by PT    Recommendations for Other Services     Frequency      Precautions / Restrictions Precautions Precautions: None   Pertinent Vitals/Pain VSS      Mobility  Transfers Transfers: Sit to Stand;Stand to Sit Sit to Stand: 7: Independent;With upper extremity assist;With armrests;From chair/3-in-1;From toilet Stand to Sit: 7: Independent;With upper extremity assist;With armrests;To chair/3-in-1;To toilet Ambulation/Gait Ambulation/Gait Assistance: 7: Independent Ambulation Distance (Feet): 1000 Feet Assistive device: None Gait Pattern: Within Functional Limits Stairs: Yes Stairs Assistance: 5: Supervision Stairs Assistance Details (indicate cue type and reason): supervision for O2 and heart monitor Stair Management Technique: One rail Right Number of Stairs: 8    Exercises     PT Diagnosis:    PT Problem List:   PT Treatment Interventions:       PT Goals(Current goals can be found in the care plan section) Acute Rehab PT Goals PT Goal Formulation: No goals set, d/c therapy  Visit Information  Last PT Received On: 07/26/12 Assistance Needed: +1 History of Present Illness: Pt adm with tension pneumothorax due  to advanced sarcoidosis.  Underwent VATS and BLEB stapling.       Prior Functioning  Home Living Family/patient expects to be discharged to:: Private residence Living Arrangements: Alone Available Help at Discharge: Other (Comment);Available PRN/intermittently (girlfriend) Home Layout: Two level Alternate Level Stairs-Number of Steps: 1 flight Alternate Level Stairs-Rails: Left Home Equipment: Other (comment) (oxygen) Prior Function Level of Independence: Independent Communication Communication: No difficulties    Cognition  Cognition Arousal/Alertness: Awake/alert Behavior During Therapy: WFL for tasks assessed/performed Overall Cognitive Status: Within Functional Limits for tasks assessed    Extremity/Trunk Assessment Upper Extremity Assessment Upper Extremity Assessment: Overall WFL for tasks assessed Lower Extremity Assessment Lower Extremity Assessment: Overall WFL for tasks assessed   Balance Balance Balance Assessed: Yes Dynamic Standing Balance Dynamic Standing - Balance Support: No upper extremity supported Dynamic Standing - Level of Assistance: 7: Independent  End of Session PT - End of Session Activity Tolerance: Patient tolerated treatment well Patient left: in chair;with call bell/phone within reach Nurse Communication: Mobility status  GP     [facility] 07/26/2012, 3:06 PM  [employer] PT [phone]

## 2012-07-26 NOTE — Discharge Summary (Signed)
Physician Discharge Summary     Patient ID: [name] MRN: [id number] DOB/AGE: 48-Sep-1967 48 y.o.  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 He has occasional flares that sound asthmatic in nature, gets treated with prednisone. [facility] acute sob, tachy, required ETT, developed shock, pcxr with tension ptx. [name] placed chest tube, improved clinically. The chest tube looks like it went into the fissure but the left ptx was markedly reduced with a small residual.  He was supported in the ICU initially at [facility]. In addition to earlier mentioned interventions culture data was obtained and empiric antibiotics were started. His initial sputum culture from admit showed both staph aureus and pseudomonas. He was treated w/ ceftazidime from 3/30 to 7/2 and levaquin from 7/2 to 7/6.  He was extubated on 7/1 but still had Airleak in CT system and pneumothorax on film.  We had hoped that getting him off positive pressure would allow for this to resolve so continued suction and watchful waiting remained the plan. There was initially a small amount of subcutaneous emphysema around the tube but this had subsequently spread out over the chest and neck. We had thoracic surgery (dr Laneta Simmers) see him on 7/3. He was then transferred to [facility] and on 7/7 underwent:Left video-assisted thoracoscopy, stapling of apical bleb, mechanical pleurodesis. The airleak finally resolved. He had no further ptx on film for 48 hours of  subsequent CXRs and monitoring. His Chest tube was removed on 7/16. Unfortunately he continued to have marked chest discomfort, and started to spike fever as of 7/20. We repeated at CT chest which showed moderate left effusion which was loculated anteriorly. On 7/21 his fever persisted, given his widely abnormal baseline CXR it was difficult to interpret new  Airspace disease, however we went ahead and started empiric antibiotics on 7/21 to cover for possible HCAP. We discussed the effusion with thoracic surgery and it was felt that IR guided thoracentesis was needed. This was carried out on 7/22. He underwent thoracentesis on 7/22 where 90ml was removed. Analysis was c/w exudate. His culture data was negative.   He slowly seemed to improve from here, however did have intermittent somatic complaints including vague c/o abd pain and left sided chest pain. We did have GI see him and we determined that his pain was multi-factorial: residual [place] air from the prior pneumothorax and prob residual surgical pain. On 7/23 he had some increased wheezing and was started on steroid taper.  We treated his pain symptomatically. Ultimately he appears to finally have improved as of 7/24. At that point we narrowed antibiotics. We opted to give him 24 more hours of observation prior to d/c. He had an un-eventful night leading up to d/c. Stated that he felt ready to go home at this time.   Of note he does have some hgb drift from 7/23 to 7/25 with no apparent blood loss. This appears to be mostly a mix of hemodilution (since starting steroids), he is a  little hypochromic on his diff so will check anemia panel on our next f/u he may benefit from iron supplementation. Would also have him see his primary as FOB may be needed as well.     Discharge Plan by diagnoses  Acute on Chronic Respiratory failure  ES fibrocavitary sarcoidosis, has been off daily steroids as of March 2014  Left PTX c/b extensive subq emphysema  Post VATS, L  apical bleb stapling/machanical pleurodesis 7/7  CAP vs purulent bronchitis, treated 6/30 - 7/7  OSA, usually on CPAP  Post-op fever, prob HCAP w/ complicated L effusion with areas of loculation - s/p thoracentesis 7/22   Discharge Plan: Cont supplemental O2 2-3 liters  Home on routine BD regimen  Baclofen 10mg  Tid prn musc spasm at pt's request. D/c further fent, will give him PRNs for CP F/u 2 weeks w/ bartle staples to be removed then  Slow pred taper  Complete 10d Levaquin  Resume CPAP when able.   HTN  AF > NSR  NSVT  P:  Cont metoprolol  Anemia Hgb has drifted from 7/23 to 7/25. This has been in the setting of steroids which seems to have resulted in some water retention and likely hemo dilutional state. We sent an anemia profile prior to d/c.   Recent Labs Lab 07/29/12 0430 08/02/12 0330 08/04/12 0430  HGB 9.4* 9.3* 7.7*  Plan F/u out-pt cbc Might need to have FOB check   Significant Hospital tests/ studies/ interventions and procedures  Consults LINES / TUBES:  6/30 ETT >> 7/1  6/30 L chest tube >> 7/7  7/7 L chest tubes (Dr Laneta Simmers) >>  6/30 L subclavian CVC >> 7/15   CULTURES:  6/30 resp>>> abundant staph, abundant pseudomonas  6/30 BC >> NEG  7/13 Cdiff >>neg  Levaquin 7/24>>>  ANTIBIOTICS:  Ceftazidime 3/30>>>7/2  vanc 6/30>>>7/1  levaquin 7/2>>> 7/6 Cefuroxime 7/7 >> post-op only  6/17 ct chest: 10.1 x 7.8 cm thin-walled cavitary/bullous lesion in the posterior right upper lobe, unchanged. Confluent perihilar opacities with traction bronchiectasis and architectural distortion, related to known sarcoidosis. Associated peribronchovascular nodularity bilaterally.  6/30 - tension PTX, resp failure, chest tube placed, intubated.  7/1 - Extubated  7/7 - VATS (Bartle) - stapling of L apical bleb, mechanical pleurodesis  7/15 8 beat run of VT, asymptomatic  Discharge Exam: BP 108/66  Pulse 96  Temp(Src) 98.6 F (37 C) (Oral)  Resp 18  Ht 5' 7.25"  (1.708 m)  Wt 86.183 kg (190 lb)  BMI 29.54 kg/m2  SpO2 98% PHYSICAL EXAMINATION:  General: NAD, mild distress  Neuro: alert, oriented, moves all 4. Anxious when he has increase in L CP  HEENT: WNL, voice hoarse, UA noise / stridor  Cardiovascular: RRR s M  Lungs: decreased BS but no overt wheezing,Abdomen: soft, NT, NABS  Ext: warm, No edema, no cyanosis   Labs at discharge Lab Results  Component Value Date   CREATININE 0.95 08/03/2012   BUN 7 08/03/2012   NA 135 08/03/2012   K 4.3 08/03/2012   CL 97 08/03/2012   CO2 31 08/03/2012   Lab Results  Component Value Date   WBC 6.1 08/04/2012   HGB 7.7* 08/04/2012   HCT 24.3* 08/04/2012   MCV 80.5 08/04/2012   PLT 433* 08/04/2012   Lab Results  Component Value Date   ALT 16 07/30/2012   AST 18 07/30/2012   ALKPHOS 77 07/30/2012   BILITOT 0.4 07/30/2012   Lab Results  Component Value Date  INR 0.94 07/17/2012    Current radiology studies Dg Chest Portable 1 View  08/03/2012   *RADIOLOGY REPORT*  Clinical Data: Shortness of breath, follow up of effusion  PORTABLE CHEST - 1 VIEW  Comparison: Portable chest x-ray of 08/02/2012  Findings: The lungs are not quite as well aerated.  Bibasilar opacities left greater than right persist. There do appear to be pleural effusions present left larger than right. A small amount of right chest wall subcutaneous air is noted, with some subcutaneous air in the left neck as well.  No definite pneumothorax is seen.  IMPRESSION: Persistent bibasilar opacities left greater than right.  No definite pneumothorax.   Original Report Authenticated By: Dwyane Dee, M.D.    Disposition:  01-Home or Self Care      Discharge Orders   Future Appointments Provider Department Dept Phone   08/09/2012 10:30 AM Alleen Borne, MD [facility] [phone]   08/10/2012 3:15 PM Julio Sicks, NP [facility] [phone]   Future Orders Complete By Expires     (HEART  FAILURE PATIENTS) Call MD:  Anytime you have any of the following symptoms: 1) 3 pound weight gain in 24 hours or 5 pounds in 1 week 2) shortness of breath, with or without a dry hacking cough 3) swelling in the hands, feet or stomach 4) if you have to sleep on extra pillows at night in order to breathe.  As directed     Call MD for:  extreme fatigue  As directed     Call MD for:  severe uncontrolled pain  As directed     Call MD for:  temperature >100.4  As directed     DME Other see comment  As directed     Comments:      Please assist in set up for motorized wheel chair.    Diet - low sodium heart healthy  As directed     For home use only DME oxygen  As directed     Questions:      Mode or (Route):  Nasal cannula    Liters per Minute:  3    Frequency:      Oxygen conserving device:      Increase activity slowly  As directed         Medication List    STOP taking these medications       albuterol (2.5 MG/3ML) 0.083% nebulizer solution  Commonly known as:  PROVENTIL     albuterol 108 (90 BASE) MCG/ACT inhaler  Commonly known as:  PROVENTIL HFA;VENTOLIN HFA     amLODipine 10 MG tablet  Commonly known as:  NORVASC     azithromycin 250 MG tablet  Commonly known as:  ZITHROMAX     beclomethasone 80 MCG/ACT inhaler  Commonly known as:  QVAR     chlorthalidone 25 MG tablet  Commonly known as:  HYGROTON      TAKE these medications       amitriptyline 25 MG tablet  Commonly known as:  ELAVIL  Take 25 mg by mouth at bedtime. For sleep     bisacodyl 5 MG EC tablet  Commonly known as:  DULCOLAX  Take 2 tablets (10 mg total) by mouth daily as needed for constipation.     budesonide 0.25 MG/2ML nebulizer solution  Commonly known as:  PULMICORT  Take 2 mLs (0.25 mg total) by nebulization 2 (two) times daily.     calcium-vitamin D  500-200 MG-UNIT per tablet  Commonly known as:  OSCAL WITH D  Take 1 tablet by mouth every morning.     cholecalciferol 1000 UNITS tablet   Commonly known as:  VITAMIN D  Take 1,000 Units by mouth every morning.     cyclobenzaprine 10 MG tablet  Commonly known as:  FLEXERIL  Take 10 mg by mouth 3 (three) times daily as needed. Muscle spasms.     diphenhydrAMINE 25 MG tablet  Commonly known as:  BENADRYL  Take 25 mg by mouth every 6 (six) hours as needed. Allergies     docusate sodium 100 MG capsule  Commonly known as:  COLACE  Take 1 capsule (100 mg total) by mouth every 12 (twelve) hours.     famotidine 20 MG tablet  Commonly known as:  PEPCID  Take 1 tablet (20 mg total) by mouth 2 (two) times daily.     ferrous sulfate 325 (65 FE) MG tablet  Take 1 tablet (325 mg total) by mouth 2 (two) times daily with a meal.     fluticasone 50 MCG/ACT nasal spray  Commonly known as:  FLONASE  Place 2 sprays into the nose daily.     guaiFENesin 600 MG 12 hr tablet  Commonly known as:  MUCINEX  Take 1,200 mg by mouth 2 (two) times daily. For congestion.     levalbuterol 0.63 MG/3ML nebulizer solution  Commonly known as:  XOPENEX  Every 6 hours and every 3 hours as needed for SOB/wheezing     levofloxacin 750 MG tablet  Commonly known as:  LEVAQUIN  Take 1 tablet (750 mg total) by mouth daily with breakfast.     lidocaine 5 %  Commonly known as:  LIDODERM  Place 1 patch onto the skin daily. Remove & Discard patch within 12 hours or as directed by MD     metoprolol 50 MG tablet  Commonly known as:  LOPRESSOR  Take 1 tablet (50 mg total) by mouth 2 (two) times daily.     multivitamin with minerals tablet  Take 1 tablet by mouth every morning.     omeprazole 40 MG capsule  Commonly known as:  PRILOSEC  40 mg. Take 1 capsule (40 mg total) by mouth 2 times daily.     oxyCODONE-acetaminophen 5-325 MG per tablet  Commonly known as:  PERCOCET/ROXICET  Take 2 tablets by mouth every 6 (six) hours.     predniSONE 10 MG tablet  Commonly known as:  DELTASONE  Take 4 tabs  daily with food x 4 days, then 3 tabs daily x 4  days, then 2 tabs daily x 4 days, then 1 tab daily and hold at this dose (or as instructed otherwise)     pregabalin 200 MG capsule  Commonly known as:  LYRICA  Take 200 mg by mouth 2 (two) times daily.     promethazine 25 MG tablet  Commonly known as:  PHENERGAN  25 mg. Take 1 tablet (25 mg total) by mouth every 6 (six) hours as needed for Nausea.     sodium chloride 0.65 % Soln nasal spray  Commonly known as:  OCEAN  Place 2 sprays into the nose 4 (four) times daily.     traZODone 100 MG tablet  Commonly known as:  DESYREL  Take 100 mg by mouth at bedtime. sleep       Follow-up Information   Follow up with Alleen Borne, MD On 08/09/2012. (be there at 930 ([facility], in  same building as Dr Laneta Simmers), then your appointment w/ Dr Laneta Simmers is at 1030 )    Contact information:   [address] [place] [address] [phone]       Follow up with [facility], NP On 08/10/2012. (315pm )    Contact information:   [address] [place] [address] [phone]       Follow up with Provider Not In System.      Follow up with Provider Not In System.      Follow up with denizar  In 2 weeks.      Discharged Condition: good  Signed: BABCOCK,PETE, 08/04/2012  11:31 AM Pager: [phone] or [phone]  *Care during the described time interval was provided by me and/or other providers on the critical care team. I have reviewed this patient's available data, including medical history, events of note, physical examination and test results as part of my evaluation.  Levy Pupa, MD, PhD 08/04/2012, 1:15 PM [facility] [phone] or if no answer [phone]

## 2012-07-27 ENCOUNTER — Inpatient Hospital Stay (HOSPITAL_COMMUNITY): Payer: 59

## 2012-07-27 MED ORDER — BISACODYL 5 MG PO TBEC: 10.0000 mg | DELAYED_RELEASE_TABLET | Freq: Every day | ORAL | Status: DC | PRN

## 2012-07-27 MED ORDER — OXYCODONE HCL 5 MG PO TABS: 5.0000 mg | ORAL_TABLET | ORAL | Status: DC | PRN

## 2012-07-27 MED ORDER — LEVALBUTEROL HCL 0.63 MG/3ML IN NEBU: INHALATION_SOLUTION | RESPIRATORY_TRACT | Status: DC

## 2012-07-27 MED ORDER — METOPROLOL TARTRATE 50 MG PO TABS: 50.0000 mg | ORAL_TABLET | Freq: Two times a day (BID) | ORAL | Status: DC

## 2012-07-27 NOTE — Progress Notes (Addendum)
       [address]       [place] [address]             [phone]          10 Days Post-Op Procedure(s) (LRB): VIDEO ASSISTED THORACOSCOPY (VATS)/BLEB Stapling (Left)  Subjective: Sore, but breathing stable. No new complaints.   Objective: Vital signs in last 24 hours: Patient Vitals for the past 24 hrs:  BP Temp Temp src Pulse Resp SpO2 Weight  07/27/12 0555 110/71 mmHg 99 F (37.2 C) Oral 90 12 98 % -  07/27/12 0239 - - - - - 100 % -  07/26/12 2251 115/74 mmHg 98.2 F (36.8 C) Oral 87 16 100 % 160 lb 6.4 oz (72.757 kg)  07/26/12 2109 - - - - - 100 % -  07/26/12 1551 109/75 mmHg 98 F (36.7 C) Oral 90 16 100 % -  07/26/12 1502 - - - - - 100 % -  07/26/12 1200 105/69 mmHg 97.4 F (36.3 C) Oral - - 95 % -  07/26/12 1000 142/76 mmHg - - 108 - - -   Current Weight  07/26/12 160 lb 6.4 oz (72.757 kg)     Intake/Output from previous day: 07/16 0701 - 07/17 0700 In: 480 [P.O.:480] Out: -     PHYSICAL EXAM:  Heart: RRR Lungs: Rare wheeze, occ crackles Chest tube site clean and dry    Lab Results: CBC: Recent Labs  07/25/12 0309  WBC 7.3  HGB 10.7*  HCT 34.3*  PLT 386   BMET: No results found for this basename: NA, K, CL, CO2, GLUCOSE, BUN, CREATININE, CALCIUM,  in the last 72 hours  PT/INR: No results found for this basename: LABPROT, INR,  in the last 72 hours    Assessment/Plan: S/P Procedure(s) (LRB): VIDEO ASSISTED THORACOSCOPY (VATS)/BLEB Stapling (Left) CXR has not been done yet this am.  Will follow up when completed.   If CXR is stable, he will be okay for discharge from our standpoint.  Will arrange OP followup.   LOS: 17 days    COLLINS,GINA H 07/27/2012   Chart reviewed, patient examined, agree with above. CXR looks stable with no ptx. Subcutaneous air is gradually resolving.

## 2012-07-27 NOTE — Progress Notes (Signed)
PULMONARY  / CRITICAL CARE MEDICINE  Name: [name] MRN: [id number] DOB: [birth year]    ADMISSION DATE:  07/10/2012 CONSULTATION DATE:  07/10/12  REFERRING MD :  [name] PRIMARY SERVICE: PCCM  CHIEF COMPLAINT:  Acute resp failure, tension ptx in setting advanced sarcoidosis  BRIEF PATIENT DESCRIPTION: 47 y/o male with PMH of sarcoidosis, OSA, HTN, and home O2 use who came into [facility] with ARF r/t tension pneumothorax.  Patient was intubated and chest tube placed by [name]>  SIGNIFICANT EVENTS / STUDIES:  6/17 ct chest: 10.1 x 7.8 cm thin-walled cavitary/bullous lesion in the posterior right upper lobe, unchanged. Confluent perihilar opacities with traction bronchiectasis and architectural distortion, related to known sarcoidosis. Associated peribronchovascular nodularity bilaterally. 6/30 -  tension PTX, resp failure, chest tube placed, intubated. 7/1 -  Extubated 7/7 - VATS (Bartle) - stapling of L apical bleb, mechanical pleurodesis 7/15 8 beat run of VT, asymptomatic 7/16 Chest tube removed  LINES / TUBES: 6/30 ETT >> 7/1 6/30 L chest tube >> 7/7 7/7 L chest tubes (Dr Laneta Simmers) >> 7/16 6/30 L subclavian CVC >> 7/15  CULTURES: 6/30 resp>>> abundant staph, abundant pseudomonas 6/30 BC >> NEG 7/13 Cdiff >>neg  ANTIBIOTICS: Ceftazidime 3/30>>>7/2 vanc 6/30>>>7/1 levaquin 7/2>>> d/ced Cefuroxime 7/7 >> post-op only  SUBJECTIVE:  C/o SOB, R sided pain.    VITAL SIGNS: Temp:  [97.4 F (36.3 C)-99 F (37.2 C)] 98.4 F (36.9 C) (07/17 0810) Pulse Rate:  [87-98] 98 (07/17 0810) Resp:  [12-20] 20 (07/17 0810) BP: (105-115)/(69-75) 113/74 mmHg (07/17 0810) SpO2:  [95 %-100 %] 98 % (07/17 0810) Weight:  [160 lb 6.4 oz (72.757 kg)] 160 lb 6.4 oz (72.757 kg) (07/16 2251)   PHYSICAL EXAMINATION: General: NAD, mild distress  Neuro: alert, oriented, moves all 4.  HEENT: WNL Cardiovascular: RRR s M Lungs: Resps even mildly labored, diminished R, no audible wheezes, bilateral crackles   Abdomen: soft, NT, NABS Ext: warm, No edema, no cyanosis  LABS:  Recent Labs Lab 07/21/12 0424  NA 138  K 3.8  CL 99  CO2 35*  BUN 7  CREATININE 0.82  GLUCOSE 153*    Recent Labs Lab 07/21/12 0424 07/23/12 0418 07/25/12 0309  HGB 10.2* 11.7* 10.7*  HCT 33.1* 38.4* 34.3*  WBC 6.2 10.4 7.3  PLT 333 451* 386    Recent Labs Lab 07/21/12 0424 07/23/12 0418 07/25/12 0309  WBC 6.2 10.4 7.3    Recent Labs Lab 07/22/12 0002 07/22/12 0804 07/22/12 1548 07/23/12 0006 07/23/12 0724  GLUCAP 106* 100* 99 112* 99   CXR:  Dg Chest 2 View  07/27/2012   *RADIOLOGY REPORT*  Clinical Data: Status post VATS.  Shortness of breath.  CHEST - 2 VIEW  Comparison: Single view of the chest 07/26/2012 and07/15/2014. CT chest 06/27/2012.  Findings: Extensive subcutaneous air about the chest, worse on the left, persists. Pleural and parenchymal opacities in the mid and lower lung zones, worse on the left, do not appear changed over the past 2 days.  Bullous lesion in the right apex is again noted. Heart size is normal.  IMPRESSION:  1.  Negative for pneumothorax.  Extensive subcutaneous air about the chest persists. 2.  No change in left worse than right pleural and parenchymal opacities.   Original Report Authenticated By: Holley Dexter, M.D.   Dg Chest Port 1 View  07/26/2012   *RADIOLOGY REPORT*  Clinical Data: Status post chest tube removal.  PORTABLE CHEST - 1 VIEW  Comparison: One-view chest 07/26/2012 at  06:20 a.m.  Findings: The left-sided chest tube has been removed.  Scarring is present at the left lung apex without definite pleural air. Bilateral fibrosis is otherwise unchanged.  Subcutaneous air bilaterally is similar to the prior exam.  IMPRESSION:  1.  Interval removal of the left-sided chest tube without significant left pleural air. 2.  Similar appearance of bilateral pleural scarring and fibrosis. 3.  Persistent bilateral subcutaneous emphysema.   Original Report  Authenticated By: Marin Roberts, M.D.   Dg Chest Port 1 View  07/26/2012   *RADIOLOGY REPORT*  Clinical Data: Sarcoidosis.  Pneumothorax.  PORTABLE CHEST - 1 VIEW  Comparison: 07/25/2012  Findings: Left chest tube is stable.  The left subclavian central venous line has been removed.  No pneumothorax.  Large bullae in the right upper lung zone is stable.  Patchy bilateral mid and lower lung zone airspace opacities, greater on the left, is also stable.  Subcutaneous emphysema at the neck base and along the chest wall mostly the antral lateral chest wall is also stable.  IMPRESSION: Left subclavian central venous line has been removed since the previous day's study.  No other change.  No pneumothorax. Persistent patchy lung airspace opacities.   Original Report Authenticated By: Amie Portland, M.D.     ASSESSMENT / PLAN:  Acute on Chronic Respiratory failure ES fibrocavitary sarcoidosis, has been off daily steroids as of March 2014 Left PTX c/b extensive subq emphysema  Post VATS, L apical bleb stapling/machanical pleurodesis 7/7 CAP vs purulent bronchitis, treated OSA, usually on CPAP  P:   Cont supplemental O2 Cont BDs, Flovent (as substitute for QVAR) Hold d/c for now F/u CXR  D/c pred 7/17 and follow  HTN AF > NSR NSVT - one brief asymptomatic episode 7/15. No further W/U indicated P:  Cont metoprolol Cont PRN hydralazine  Anemia   P:  Monitor CBC intermittently   WHITEHEART,KATHRYN, NP 07/27/2012  10:29 AM Pager: [phone] or [phone]  *Care during the described time interval was provided by me and/or other providers on the critical care team. I have reviewed this patient's available data, including medical history, events of note, physical examination and test results as part of my evaluation.  Levy Pupa, MD, PhD 07/27/2012, 2:15 PM [facility] [phone] or if no answer [phone]

## 2012-07-28 ENCOUNTER — Inpatient Hospital Stay (HOSPITAL_COMMUNITY): Payer: 59

## 2012-07-28 LAB — PROCALCITONIN: Procalcitonin: 0.4 ng/mL

## 2012-07-28 MED ORDER — OXYCODONE-ACETAMINOPHEN 5-325 MG PO TABS
2.0000 | ORAL_TABLET | ORAL | Status: DC
  Administered 2012-07-28: 2 via ORAL
  Filled 2012-07-28: qty 2

## 2012-07-28 MED ORDER — FENTANYL CITRATE 0.05 MG/ML IJ SOLN
INTRAMUSCULAR | Status: AC
  Administered 2012-07-28: 100 ug
  Filled 2012-07-28: qty 2

## 2012-07-28 MED ORDER — KETOROLAC TROMETHAMINE 15 MG/ML IJ SOLN
15.0000 mg | Freq: Three times a day (TID) | INTRAMUSCULAR | Status: AC | PRN
  Administered 2012-07-28 – 2012-07-29 (×3): 15 mg via INTRAVENOUS
  Filled 2012-07-28 (×4): qty 1

## 2012-07-28 MED ORDER — OXYCODONE-ACETAMINOPHEN 5-325 MG PO TABS
2.0000 | ORAL_TABLET | Freq: Four times a day (QID) | ORAL | Status: DC
  Administered 2012-07-28 – 2012-08-04 (×28): 2 via ORAL
  Filled 2012-07-28 (×28): qty 2

## 2012-07-28 MED ORDER — KETOROLAC TROMETHAMINE 30 MG/ML IJ SOLN
30.0000 mg | Freq: Once | INTRAMUSCULAR | Status: AC
  Administered 2012-07-28: 30 mg via INTRAVENOUS
  Filled 2012-07-28: qty 1

## 2012-07-28 MED ORDER — FENTANYL CITRATE 0.05 MG/ML IJ SOLN
100.0000 ug | Freq: Once | INTRAMUSCULAR | Status: AC
  Administered 2012-07-28: 100 ug via INTRAVENOUS

## 2012-07-28 MED ORDER — BUDESONIDE 0.25 MG/2ML IN SUSP
0.2500 mg | Freq: Two times a day (BID) | RESPIRATORY_TRACT | Status: DC
  Administered 2012-07-28 – 2012-08-04 (×14): 0.25 mg via RESPIRATORY_TRACT
  Filled 2012-07-28 (×15): qty 2

## 2012-07-28 NOTE — Progress Notes (Signed)
Patient placed on Monitor, stat CXR ordered by rapid response.

## 2012-07-28 NOTE — Progress Notes (Signed)
Patient stated that he took home medication Zantax 150 mg. Spoke with patient about not taking any other medication while in the hospital other than what the MD has ordered.  Patient agreed.  Medication given to girlfriend to take home. Steele Berg RN

## 2012-07-28 NOTE — Progress Notes (Signed)
Pt called me into room with complaints of not being able to breath. Rapid response called, respiratory called for breathing treatment, and doctor called. Stat portable chest x-ray ordered.

## 2012-07-28 NOTE — Progress Notes (Addendum)
Results of chest xray called to doctor. No new orders. Pain medication given for pain level of 6/10.

## 2012-07-28 NOTE — Progress Notes (Signed)
PULMONARY  / CRITICAL CARE MEDICINE  Name: [name] MRN: [id number] DOB: 1965-08-26    ADMISSION DATE:  07/10/2012 CONSULTATION DATE:  07/10/12  REFERRING MD :  [name] PRIMARY SERVICE: PCCM  CHIEF COMPLAINT:  Acute resp failure, tension ptx in setting advanced sarcoidosis  BRIEF PATIENT DESCRIPTION: 47 y/o male with PMH of sarcoidosis, OSA, HTN, and home O2 use who came into [facility] with ARF r/t tension pneumothorax.  Patient was intubated and chest tube placed by [name]>  SIGNIFICANT EVENTS / STUDIES:  6/17 ct chest: 10.1 x 7.8 cm thin-walled cavitary/bullous lesion in the posterior right upper lobe, unchanged. Confluent perihilar opacities with traction bronchiectasis and architectural distortion, related to known sarcoidosis. Associated peribronchovascular nodularity bilaterally. 6/30 -  tension PTX, resp failure, chest tube placed, intubated. 7/1 -  Extubated 7/7 - VATS (Bartle) - stapling of L apical bleb, mechanical pleurodesis 7/15 8 beat run of VT, asymptomatic 7/16 Chest tube removed  LINES / TUBES: 6/30 ETT >> 7/1 6/30 L chest tube >> 7/7 7/7 L chest tubes (Dr Laneta Simmers) >> 7/16 6/30 L subclavian CVC >> 7/15  CULTURES: 6/30 resp>>> abundant staph, abundant pseudomonas 6/30 BC >> NEG 7/13 Cdiff >>neg  ANTIBIOTICS: Ceftazidime 3/30>>>7/2 vanc 6/30>>>7/1 levaquin 7/2>>> d/ced Cefuroxime 7/7 >> post-op only  SUBJECTIVE:  C/o SOB, R sided pain.    VITAL SIGNS: Temp:  [97.9 F (36.6 C)-102.5 F (39.2 C)] 102.5 F (39.2 C) (07/18 0710) Pulse Rate:  [83-128] 126 (07/18 0855) Resp:  [18-32] 24 (07/18 0855) BP: (103-127)/(60-94) 114/78 mmHg (07/18 0855) SpO2:  [95 %-100 %] 100 % (07/18 0855) Weight:  [72.757 kg (160 lb 6.4 oz)] 72.757 kg (160 lb 6.4 oz) (07/17 2022)   PHYSICAL EXAMINATION: General: NAD, mild distress  Neuro: alert, oriented, moves all 4.  HEENT: WNL, voice hoarse  Cardiovascular: RRR s M Lungs: exp wheeze (upper airway as well), scattered rhonchi.   Abdomen: soft, NT, NABS Ext: warm, No edema, no cyanosis  LABS: No results found for this basename: NA, K, CL, CO2, BUN, CREATININE, GLUCOSE,  in the last 168 hours  Recent Labs Lab 07/23/12 0418 07/25/12 0309  HGB 11.7* 10.7*  HCT 38.4* 34.3*  WBC 10.4 7.3  PLT 451* 386    Recent Labs Lab 07/23/12 0418 07/25/12 0309  WBC 10.4 7.3    Recent Labs Lab 07/22/12 0002 07/22/12 0804 07/22/12 1548 07/23/12 0006 07/23/12 0724  GLUCAP 106* 100* 99 112* 99   CXR:  Dg Chest 2 View  07/27/2012   *RADIOLOGY REPORT*  Clinical Data: Status post VATS.  Shortness of breath.  CHEST - 2 VIEW  Comparison: Single view of the chest 07/26/2012 and07/15/2014. CT chest 06/27/2012.  Findings: Extensive subcutaneous air about the chest, worse on the left, persists. Pleural and parenchymal opacities in the mid and lower lung zones, worse on the left, do not appear changed over the past 2 days.  Bullous lesion in the right apex is again noted. Heart size is normal.  IMPRESSION:  1.  Negative for pneumothorax.  Extensive subcutaneous air about the chest persists. 2.  No change in left worse than right pleural and parenchymal opacities.   Original Report Authenticated By: Holley Dexter, M.D.   Dg Chest Port 1 View  07/28/2012   *RADIOLOGY REPORT*  Clinical Data: Shortness of breath and abdominal pain.  PORTABLE CHEST - 1 VIEW  Comparison: 07/28/2012  Findings: 0840 hours.  The bibasilar interstitial and patchy airspace disease persists, without substantial interval change given the differential lung  volumes.  Cardiopericardial silhouette is enlarged is stable.  The subcutaneous emphysema persists. Telemetry leads overlie the chest.  IMPRESSION: Increased lung volumes without substantial interval change exam.   Original Report Authenticated By: Kennith Center, M.D.   Dg Chest Port 1 View  07/28/2012   *RADIOLOGY REPORT*  Clinical Data: Left-sided chest and flank pain.  Recent pneumothorax.  PORTABLE CHEST  - 1 VIEW  Comparison: Chest radiograph performed 07/27/2012  Findings: The lungs are hypoexpanded.  No pneumothorax is seen. Pleural and parenchymal airspace opacities at the mid and lower lung zones, worse on the left, appear grossly stable, given lung hypoexpansion. These reflect the patient's known sarcoidosis.  A large bulla is again noted at the right midlung zone.  No definite pleural effusion or pneumothorax is seen; stable left-sided pleural thickening is again noted.  The cardiomediastinal silhouette remains normal in size.  No acute osseous abnormalities are seen.  Soft tissue air along both sides of the chest wall and at the supraclavicular regions is grossly stable in appearance.  IMPRESSION:  1.  Lungs hypoexpanded; underlying chronic pleural and parenchymal airspace opacities are grossly stable, reflecting known sarcoidosis.  Large bulla again noted at the right midlung zone. No acute superimposed airspace consolidation seen. 2.  No evidence of pneumothorax. 3.  Soft tissue air along both sides of the chest wall and at the supraclavicular regions is grossly stable in appearance.   Original Report Authenticated By: Tonia Ghent, M.D.   Dg Chest Port 1 View  07/26/2012   *RADIOLOGY REPORT*  Clinical Data: Status post chest tube removal.  PORTABLE CHEST - 1 VIEW  Comparison: One-view chest 07/26/2012 at 06:20 a.m.  Findings: The left-sided chest tube has been removed.  Scarring is present at the left lung apex without definite pleural air. Bilateral fibrosis is otherwise unchanged.  Subcutaneous air bilaterally is similar to the prior exam.  IMPRESSION:  1.  Interval removal of the left-sided chest tube without significant left pleural air. 2.  Similar appearance of bilateral pleural scarring and fibrosis. 3.  Persistent bilateral subcutaneous emphysema.   Original Report Authenticated By: Marin Roberts, M.D.     ASSESSMENT / PLAN:  Acute on Chronic Respiratory failure ES fibrocavitary  sarcoidosis, has been off daily steroids as of March 2014 Left PTX c/b extensive subq emphysema  Post VATS, L apical bleb stapling/machanical pleurodesis 7/7 CAP vs purulent bronchitis, treated OSA, usually on CPAP  7/18 Having acute onset of Chest-pain and associated dyspnea. Sounds like this is his typical GERD related symptoms but gave him significant distress. CXR unchanged. In setting of fever though can't exclude new infection.   P:   Cont supplemental O2 Cont BDs, Flovent (as substitute for QVAR) Reflux regimen re-adjusted Cycle PCT and check CBC Hold d/c for now  HTN AF > NSR NSVT - one brief asymptomatic episode 7/15. No further W/U indicated P:  Cont metoprolol Cont PRN hydralazine  Anemia   P:  Monitor CBC intermittently  Fever P: Check cbc and PCT.   Levy Pupa, MD, PhD 07/28/2012, 2:02 PM [facility] [phone] or if no answer [phone]

## 2012-07-28 NOTE — Progress Notes (Signed)
Pt called with complaints of indigestion medication given. Called doctor and left message with secretary for doctor to call back

## 2012-07-28 NOTE — Progress Notes (Addendum)
Dr. Delton Coombes into see patient.

## 2012-07-28 NOTE — Progress Notes (Signed)
Into patient room c/o sob, chest pain, side pain.  Rapid response call, call also placed to Critical care doctor Darrol Angel.

## 2012-07-28 NOTE — Progress Notes (Signed)
Elink: 5:23 AM @ 07/28/2012    Nurse called stating patient c/o pleuritic pain NOS  Cxr repeated: No PTX  Plan Reassure   Dr. Kalman Shan, M.D., [facility]C.P Pulmonary and Critical Care Medicine Staff Physician [facility]  [facility]ulmonary and Critical Care Pager: [phone], If no answer or between  15:00h - 7:00h: call [phone]  07/28/2012 5:23 AM

## 2012-07-28 NOTE — Progress Notes (Addendum)
Second call to Dr. Ival Bible, requested that we call Dr. Delton Coombes. Call placed by Rapid response.

## 2012-07-28 NOTE — Progress Notes (Signed)
Pt still complaining of not being able to breath and anxious. 1mg  of IV ativan given per prn order. Passed along to day shift nurse report on pt.

## 2012-07-28 NOTE — Significant Event (Signed)
Rapid Response Event Note  Overview:  Called to assist with patient in respiratory distress Time Called: 0803 Arrival Time: 0806 Event Type: Respiratory;Other (Comment)  Initial Focused Assessment:  On arrival patient alert and oriented - sitting on  edge of bed - able to speak full sentences but dyspneic - bil BS present - equally distant - no crepitus felt - BS very tight - skin hot and dry - temp 102 per [employer].  Abd distended and tight diffuse tenderness to palpatation through out.    Patient c/o 10/10 pain -starting in mid abdomen, radiating to lower chest and through to midback.  Left chest DDI.  HR elevated - 125.  RR 28 - guarded resps - seems secondary to abd pain and distension.  O2 sats 100% on 4 liter nasal cannula.  BP 145/68.    Interventions:  Rn [place] spoke with Dr. Bard Herbert.  Patient continue to c/o worsening SOB - stat PCXR ordered.  Xoepenx and Pulmicort nebulizers started as scheduled.  Dr. Delton Coombes here to see patient.  Verbal update given.  PCXR reviewed by Dr. Delton Coombes.   3086:  Fentanyl 100 mcg IV given per order Dr. Delton Coombes.  5784:  Patient states it is not much better but seems less distressed.  Bil BS more open- exp wheezing noted bilateral.   Tried to lay back in bed - unable to tolerate - he states it feels like a spasm shooting through abdomen with movement.  Now sitting on side of bed.  0920:  Toradol 30 mg IV given per order.  0935:  Patient much more relaxed - states he feels a major difference - resps more relaxed - remains with tight abdomen and still tender to touch. 0945:  Patient relaxed - smiling - taking sips water.  HR remains ST 125.  Now receiving Percocet - will see if Tylenol in that med helps with fever.  Discussed plan of care with pain meds - now changed to scheduled doses - PPI and antacids with patient and RN for maximum coverage.  Handoff to [place] RN whom will call as needed.  Will follow.    Event Summary: Name of Physician Notified: Dr. Bard Herbert  and Dr. Delton Coombes at  (prior to arrival RRT)    at    Outcome: Stayed in room and stabalized  Event End Time: 0945  Delton [place]

## 2012-07-29 ENCOUNTER — Inpatient Hospital Stay (HOSPITAL_COMMUNITY): Payer: 59

## 2012-07-29 LAB — CBC
HCT: 29.1 % — ABNORMAL LOW (ref 39.0–52.0)
Hemoglobin: 9.4 g/dL — ABNORMAL LOW (ref 13.0–17.0)
MCHC: 32.3 g/dL (ref 30.0–36.0)
WBC: 9.3 10*3/uL (ref 4.0–10.5)

## 2012-07-29 MED ORDER — FENTANYL 25 MCG/HR TD PT72
50.0000 ug | MEDICATED_PATCH | TRANSDERMAL | Status: DC
  Filled 2012-07-29: qty 2

## 2012-07-29 MED ORDER — FENTANYL CITRATE 0.05 MG/ML IJ SOLN
75.0000 ug | Freq: Once | INTRAMUSCULAR | Status: AC
  Administered 2012-07-29: 75 ug via INTRAVENOUS
  Filled 2012-07-29: qty 2

## 2012-07-29 MED ORDER — FENTANYL 50 MCG/HR TD PT72
50.0000 ug | MEDICATED_PATCH | TRANSDERMAL | Status: DC
  Administered 2012-07-29: 50 ug via TRANSDERMAL
  Filled 2012-07-29: qty 1

## 2012-07-29 NOTE — Progress Notes (Signed)
Patient c/o of sudden onset of dyspnea, "can't get my breath" and is holding left side.  Breath sounds are clear in UL bilaterally and RLL,  but barely audible in LLL. Vital signs obtained and MD notified.  Heat applied to RUQ with some relief. Shayna Eblen, Eli Phillips, RN

## 2012-07-29 NOTE — Progress Notes (Signed)
eLink Physician-Brief Progress Note Patient Name: [name] DOB: April 23, 1965 MRN: [id number]  Date of Service  07/29/2012   HPI/Events of Note  Ongoing issues of pain control on multiple meds including percocet, Toradol, etc with no relief.  HD stable with good sats on 2L [place].  eICU Interventions  Plan: One time dose of 75 mcg fentanyl IV   Intervention Category Intermediate Interventions: Pain - evaluation and management  DETERDING,ELIZABETH 07/29/2012, 1:18 AM

## 2012-07-29 NOTE — Progress Notes (Signed)
eLink Physician-Brief Progress Note Patient Name: [name] DOB: [birth year] MRN: [id number]  Date of Service  07/29/2012   HPI/Events of Note   Chest pain (L), dyspnea, h/p pneumothorax (L), VS stable  eICU Interventions   PCXR   Intervention Category Intermediate Interventions: Diagnostic test evaluation  Albirtha Grinage 07/29/2012, 7:33 PM

## 2012-07-29 NOTE — Progress Notes (Signed)
07/29/2012 Patient ask RN for Tums, rn told patient she had to notified to pharmacy for medication. Patient wife handed her husband a pill. Rn ask what the medication, per patient it was Zantac. Rn told wife and patient he is not suppose to be taking home medication. [facility] RN.

## 2012-07-29 NOTE — Progress Notes (Signed)
PULMONARY  / CRITICAL CARE MEDICINE  Name: [name] MRN: [id number] DOB: 1965-09-08    ADMISSION DATE:  07/10/2012 CONSULTATION DATE:  07/10/12  REFERRING MD :  [name] PRIMARY SERVICE: PCCM  CHIEF COMPLAINT:  Acute resp failure, tension ptx in setting advanced sarcoidosis  BRIEF PATIENT DESCRIPTION: 47 y/o male with PMH of sarcoidosis, OSA, HTN, and home O2 use who came into [facility] with ARF r/t tension pneumothorax.  Patient was intubated and chest tube placed by [name]>  SIGNIFICANT EVENTS / STUDIES:  6/17 ct chest: 10.1 x 7.8 cm thin-walled cavitary/bullous lesion in the posterior right upper lobe, unchanged. Confluent perihilar opacities with traction bronchiectasis and architectural distortion, related to known sarcoidosis. Associated peribronchovascular nodularity bilaterally. 6/30 -  tension PTX, resp failure, chest tube placed, intubated. 7/1 -  Extubated 7/7 - VATS (Bartle) - stapling of L apical bleb, mechanical pleurodesis 7/15 8 beat run of VT, asymptomatic 7/16 Chest tube removed  LINES / TUBES: 6/30 ETT >> 7/1 6/30 L chest tube >> 7/7 7/7 L chest tubes (Dr Laneta Simmers) >> 7/16 6/30 L subclavian CVC >> 7/15  CULTURES: 6/30 resp>>> abundant staph, abundant pseudomonas 6/30 BC >> NEG 7/13 Cdiff >>neg  ANTIBIOTICS: Ceftazidime 3/30>>>7/2 vanc 6/30>>>7/1 levaquin 7/2>>> d/ced Cefuroxime 7/7 >> post-op only  SUBJECTIVE:  C/o SOB, R sided pain.    VITAL SIGNS: Temp:  [98 F (36.7 C)-100.1 F (37.8 C)] 98 F (36.7 C) (07/19 0500) Pulse Rate:  [73-126] 95 (07/19 0500) Resp:  [20-24] 20 (07/19 0500) BP: (106-120)/(63-82) 106/63 mmHg (07/19 0500) SpO2:  [99 %-100 %] 99 % (07/19 0750)   PHYSICAL EXAMINATION: General: NAD, mild distress  Neuro: alert, oriented, moves all 4.  HEENT: WNL, voice hoarse  Cardiovascular: RRR s M Lungs: exp wheeze (upper airway as well), scattered rhonchi.  Abdomen: soft, NT, NABS Ext: warm, No edema, no cyanosis  LABS: No results found  for this basename: NA, K, CL, CO2, BUN, CREATININE, GLUCOSE,  in the last 168 hours  Recent Labs Lab 07/23/12 0418 07/25/12 0309 07/29/12 0430  HGB 11.7* 10.7* 9.4*  HCT 38.4* 34.3* 29.1*  WBC 10.4 7.3 9.3  PLT 451* 386 328    Recent Labs Lab 07/23/12 0418 07/25/12 0309 07/28/12 1105 07/29/12 0430  PROCALCITON  --   --  0.40  --   WBC 10.4 7.3  --  9.3    Recent Labs Lab 07/22/12 1548 07/23/12 0006 07/23/12 0724  GLUCAP 99 112* 99   CXR:  Dg Chest Port 1 View  07/28/2012   *RADIOLOGY REPORT*  Clinical Data: Shortness of breath and abdominal pain.  PORTABLE CHEST - 1 VIEW  Comparison: 07/28/2012  Findings: 0840 hours.  The bibasilar interstitial and patchy airspace disease persists, without substantial interval change given the differential lung volumes.  Cardiopericardial silhouette is enlarged is stable.  The subcutaneous emphysema persists. Telemetry leads overlie the chest.  IMPRESSION: Increased lung volumes without substantial interval change exam.   Original Report Authenticated By: Kennith Center, M.D.   Dg Chest Port 1 View  07/28/2012   *RADIOLOGY REPORT*  Clinical Data: Left-sided chest and flank pain.  Recent pneumothorax.  PORTABLE CHEST - 1 VIEW  Comparison: Chest radiograph performed 07/27/2012  Findings: The lungs are hypoexpanded.  No pneumothorax is seen. Pleural and parenchymal airspace opacities at the mid and lower lung zones, worse on the left, appear grossly stable, given lung hypoexpansion. These reflect the patient's known sarcoidosis.  A large bulla is again noted at the right midlung zone.  No definite pleural effusion or pneumothorax is seen; stable left-sided pleural thickening is again noted.  The cardiomediastinal silhouette remains normal in size.  No acute osseous abnormalities are seen.  Soft tissue air along both sides of the chest wall and at the supraclavicular regions is grossly stable in appearance.  IMPRESSION:  1.  Lungs hypoexpanded;  underlying chronic pleural and parenchymal airspace opacities are grossly stable, reflecting known sarcoidosis.  Large bulla again noted at the right midlung zone. No acute superimposed airspace consolidation seen. 2.  No evidence of pneumothorax. 3.  Soft tissue air along both sides of the chest wall and at the supraclavicular regions is grossly stable in appearance.   Original Report Authenticated By: Tonia Ghent, M.D.     ASSESSMENT / PLAN:  Acute on Chronic Respiratory failure ES fibrocavitary sarcoidosis, has been off daily steroids as of March 2014 Left PTX c/b extensive subq emphysema  Post VATS, L apical bleb stapling/machanical pleurodesis 7/7 CAP vs purulent bronchitis, treated OSA, usually on CPAP  7/18> Having acute onset of Chest-pain and associated dyspnea. Sounds like this is his typical GERD related symptoms but gave him significant distress. CXR unchanged. In setting of fever though can't exclude new infection.  7/19> Still c/o severe left chest pain, tender on palp, IV Fentenyl from e-link helped; check another CXR, EKG, try Duragesic50...  P:   Check CXR, EKG Add Duragesic50 Q3d Cont supplemental O2 Cont BDs, Flovent (as substitute for QVAR) Cont Reflux regimen  Cycle PCT and check CBC Hold d/c    HTN AF > NSR NSVT - one brief asymptomatic episode 7/15. No further W/U indicated P:  Cont metoprolol Cont PRN hydralazine   Anemia   P:  Monitor CBC intermittently => 7/19 Hg=9.4 & WBC=9.3   Fever P: Check cbc and PCT => 7/18 PCT=0.40   Jaya Lapka M. Kriste Basque, MD  07/29/2012, 8:54 AM [facility]

## 2012-07-30 ENCOUNTER — Encounter (HOSPITAL_COMMUNITY): Payer: Self-pay | Admitting: Radiology

## 2012-07-30 ENCOUNTER — Inpatient Hospital Stay (HOSPITAL_COMMUNITY): Payer: 59

## 2012-07-30 DIAGNOSIS — J441 Chronic obstructive pulmonary disease with (acute) exacerbation: Secondary | ICD-10-CM

## 2012-07-30 LAB — COMPREHENSIVE METABOLIC PANEL
ALT: 16 U/L (ref 0–53)
Albumin: 2.6 g/dL — ABNORMAL LOW (ref 3.5–5.2)
Alkaline Phosphatase: 77 U/L (ref 39–117)
Potassium: 4.4 mEq/L (ref 3.5–5.1)
Sodium: 132 mEq/L — ABNORMAL LOW (ref 135–145)
Total Protein: 6.3 g/dL (ref 6.0–8.3)

## 2012-07-30 MED ORDER — FENTANYL 50 MCG/HR TD PT72
50.0000 ug | MEDICATED_PATCH | TRANSDERMAL | Status: DC
  Administered 2012-07-30 – 2012-08-02 (×2): 50 ug via TRANSDERMAL
  Filled 2012-07-30 (×2): qty 1

## 2012-07-30 MED ORDER — BACLOFEN 10 MG PO TABS
10.0000 mg | ORAL_TABLET | Freq: Three times a day (TID) | ORAL | Status: DC | PRN
  Administered 2012-07-30 – 2012-08-01 (×3): 10 mg via ORAL
  Filled 2012-07-30 (×4): qty 1

## 2012-07-30 MED ORDER — MENTHOL 3 MG MT LOZG
1.0000 | LOZENGE | OROMUCOSAL | Status: DC | PRN
  Administered 2012-07-30 – 2012-07-31 (×3): 3 mg via ORAL
  Filled 2012-07-30: qty 9

## 2012-07-30 MED ORDER — IOHEXOL 300 MG/ML  SOLN
100.0000 mL | Freq: Once | INTRAMUSCULAR | Status: AC | PRN
  Administered 2012-07-30: 100 mL via INTRAVENOUS

## 2012-07-30 MED ORDER — GI COCKTAIL ~~LOC~~
30.0000 mL | Freq: Once | ORAL | Status: AC
  Administered 2012-07-30: 30 mL via ORAL
  Filled 2012-07-30: qty 30

## 2012-07-30 NOTE — Progress Notes (Signed)
PULMONARY  / CRITICAL CARE MEDICINE  Name: [name] MRN: [id number] DOB: [birth year]    ADMISSION DATE:  07/10/2012 CONSULTATION DATE:  07/10/12  REFERRING MD :  [name] PRIMARY SERVICE: PCCM  CHIEF COMPLAINT:  Acute resp failure, tension ptx in setting advanced sarcoidosis  BRIEF PATIENT DESCRIPTION: 47 y/o male with PMH of sarcoidosis, OSA, HTN, and home O2 use who came into [facility] with ARF r/t tension pneumothorax.  Patient was intubated and chest tube placed by [name]>  SIGNIFICANT EVENTS / STUDIES:  6/17 ct chest: 10.1 x 7.8 cm thin-walled cavitary/bullous lesion in the posterior right upper lobe, unchanged. Confluent perihilar opacities with traction bronchiectasis and architectural distortion, related to known sarcoidosis. Associated peribronchovascular nodularity bilaterally. 6/30 -  tension PTX, resp failure, chest tube placed, intubated. 7/1 -  Extubated 7/7 - VATS (B[facility]le) - stapling of L apical bleb, mechanical pleurodesis 7/15 8 beat run of VT, asymptomatic 7/16 Chest tube removed 7/20> he is c/o incr pain epig & lower chest, sharp/ cut off his breath/ not relieved by pain meds or GI rx; plan CT chest & abd...  LINES / TUBES: 6/30 ETT >> 7/1 6/30 L chest tube >> 7/7 7/7 L chest tubes (Dr Laneta Simmers) >> 7/16 6/30 L subclavian CVC >> 7/15  CULTURES: 6/30 resp>>> abundant staph, abundant pseudomonas 6/30 BC >> NEG 7/13 Cdiff >>neg  ANTIBIOTICS: Ceftazidime 3/30>>>7/2 vanc 6/30>>>7/1 levaquin 7/2>>> d/ced Cefuroxime 7/7 >> post-op only  SUBJECTIVE:  C/o SOB, R sided pain.    VITAL SIGNS: Temp:  [98.4 F (36.9 C)-99.8 F (37.7 C)] 98.4 F (36.9 C) (07/20 1059) Pulse Rate:  [88-102] 92 (07/20 1059) Resp:  [18-20] 20 (07/20 1059) BP: (111-132)/(78-91) 132/91 mmHg (07/20 1059) SpO2:  [96 %-100 %] 97 % (07/20 1059) Weight:  [75.342 kg (166 lb 1.6 oz)] 75.342 kg (166 lb 1.6 oz) (07/19 2232)   PHYSICAL EXAMINATION: General: NAD, mild distress  Neuro: alert, oriented,  moves all 4.  HEENT: WNL, voice hoarse  Cardiovascular: RRR s M Lungs: exp wheeze (upper airway as well), scattered rhonchi.  Abdomen: soft, NT, NABS Ext: warm, No edema, no cyanosis  LABS: No results found for this basename: NA, K, CL, CO2, BUN, CREATININE, GLUCOSE,  in the last 168 hours  Recent Labs Lab 07/25/12 0309 07/29/12 0430  HGB 10.7* 9.4*  HCT 34.3* 29.1*  WBC 7.3 9.3  PLT 386 328    Recent Labs Lab 07/25/12 0309 07/28/12 1105 07/29/12 0430 07/30/12 0605  PROCALCITON  --  0.40  --  0.43  WBC 7.3  --  9.3  --    No results found for this basename: GLUCAP,  in the last 168 hours CXR:  Dg Chest Port 1 View  07/29/2012   *RADIOLOGY REPORT*  Clinical Data: Evaluate for pneumothorax  PORTABLE CHEST - 1 VIEW  Comparison: 07/29/2012 at 11:05 a.m.  Findings: No pneumothorax is seen.  Bilateral mid and lower lung zone coarse reticular and patchy airspace opacities are stable.  There is a probable small loculated left pleural effusion.  Subcutaneous air is unchanged.  IMPRESSION: No pneumothorax is seen.  No change from the earlier study.   Original Report Authenticated By: Amie Portland, M.D.   Dg Chest Port 1 View  07/29/2012   *RADIOLOGY REPORT*  Clinical Data: Dyspnea.  History of sarcoid.  PORTABLE CHEST - 1 VIEW  Comparison: 07/28/2012  Findings: The heart size is normal.  There are decreased lung volumes noted.  Extensive bilateral interstitial and airspace opacities are again  noted and appear unchanged from previous exam.  This predominately affects the mid lungs and lower lung zones.  Subcutaneous emphysema within the chest wall is unchanged from previous exam.  IMPRESSION:  1.  No change in the bilateral interstitial and airspace densities.   Original Report Authenticated By: Signa Kell, M.D.      ASSESSMENT / PLAN:  Acute on Chronic Respiratory failure ES fibrocavitary sarcoidosis, has been off daily steroids as of March 2014 Left PTX c/b extensive subq  emphysema  Post VATS, L apical bleb stapling/machanical pleurodesis 7/7 CAP vs purulent bronchitis, treated OSA, usually on CPAP  7/18> Having acute onset of Chest-pain and associated dyspnea. Sounds like this is his typical GERD related symptoms but gave him significant distress. CXR unchanged. In setting of fever though can't exclude new infection.  7/19> Still c/o severe left chest pain, tender on palp, IV Fentenyl from e-link helped; check another CXR (no change), EKG (NSR, wnl), try Duragesic50... 7/20> more CP last PM, CXR unchanged, took Zantac, Duragesic patch not sticking; they want musc relaxer, cough drops, & we will replace the patch; check CT Chest & Abd...  P:   Check CTChest &Abd Add Duragesic50 Q3d- be sure it sticks Try Baclofen 10mg  Tid prn musc spasm Cont supplemental O2 Cont BDs, Flovent (as substitute for QVAR) Cont Reflux regimen  Cycle PCT and check CBC   HTN AF > NSR NSVT - one brief asymptomatic episode 7/15. No further W/U indicated P:  Cont metoprolol Cont PRN hydralazine   Anemia   P:  Monitor CBC intermittently => 7/19 Hg=9.4 & WBC=9.3   Fever P: Check cbc and PCT => 7/18 PCT=0.40   [name] M. Kriste Basque, MD  07/30/2012, 12:05 PM [facility]

## 2012-07-30 NOTE — Progress Notes (Signed)
Patient complain of mid sternal chest pain, tight & sore and rates it a 9 out of 10. BP 130/85, HR 88, R 18, T 99.8, O2 99% 2 1/2 L/min. Spoke with Dr. Darrick Penna, will give GI cocktail per MD order. Steele Berg RN

## 2012-07-31 ENCOUNTER — Inpatient Hospital Stay (HOSPITAL_COMMUNITY): Payer: 59

## 2012-07-31 MED ORDER — GI COCKTAIL ~~LOC~~
30.0000 mL | Freq: Two times a day (BID) | ORAL | Status: DC | PRN
  Administered 2012-07-31 – 2012-08-02 (×4): 30 mL via ORAL
  Filled 2012-07-31 (×4): qty 30

## 2012-07-31 MED ORDER — FAMOTIDINE 20 MG PO TABS
20.0000 mg | ORAL_TABLET | Freq: Two times a day (BID) | ORAL | Status: DC
  Administered 2012-07-31 – 2012-08-04 (×9): 20 mg via ORAL
  Filled 2012-07-31 (×10): qty 1

## 2012-07-31 MED ORDER — ALUM & MAG HYDROXIDE-SIMETH 200-200-20 MG/5ML PO SUSP
30.0000 mL | ORAL | Status: DC | PRN
  Administered 2012-07-31 – 2012-08-04 (×4): 30 mL via ORAL
  Filled 2012-07-31 (×5): qty 30

## 2012-07-31 MED ORDER — VANCOMYCIN HCL IN DEXTROSE 1-5 GM/200ML-% IV SOLN
1000.0000 mg | Freq: Three times a day (TID) | INTRAVENOUS | Status: DC
  Administered 2012-07-31 – 2012-08-03 (×8): 1000 mg via INTRAVENOUS
  Filled 2012-07-31 (×12): qty 200

## 2012-07-31 MED ORDER — DEXTROSE 5 % IV SOLN
1.0000 g | Freq: Three times a day (TID) | INTRAVENOUS | Status: DC
  Administered 2012-07-31 – 2012-08-03 (×8): 1 g via INTRAVENOUS
  Filled 2012-07-31 (×11): qty 1

## 2012-07-31 MED ORDER — KETOROLAC TROMETHAMINE 15 MG/ML IJ SOLN
15.0000 mg | Freq: Four times a day (QID) | INTRAMUSCULAR | Status: DC
  Filled 2012-07-31 (×4): qty 1

## 2012-07-31 MED ORDER — FLUTICASONE PROPIONATE 50 MCG/ACT NA SUSP
2.0000 | Freq: Every day | NASAL | Status: DC
Start: 2012-07-31 — End: 2012-08-04
  Administered 2012-07-31 – 2012-08-03 (×3): 2 via NASAL
  Filled 2012-07-31: qty 16

## 2012-07-31 MED ORDER — KETOROLAC TROMETHAMINE 15 MG/ML IJ SOLN
15.0000 mg | Freq: Three times a day (TID) | INTRAMUSCULAR | Status: DC | PRN
  Filled 2012-07-31: qty 1

## 2012-07-31 MED ORDER — KETOROLAC TROMETHAMINE 15 MG/ML IJ SOLN
15.0000 mg | Freq: Once | INTRAMUSCULAR | Status: AC
  Administered 2012-07-31: 15 mg via INTRAVENOUS
  Filled 2012-07-31: qty 1

## 2012-07-31 MED ORDER — BIOTENE DRY MOUTH MT LIQD
1.0000 "application " | Freq: Four times a day (QID) | OROMUCOSAL | Status: DC
  Administered 2012-07-31 – 2012-08-02 (×9): 15 mL via OROMUCOSAL

## 2012-07-31 NOTE — Significant Event (Addendum)
Rapid Response Event Note  Overview: Time Called: 0005 Arrival Time: 0010 Event Type: Respiratory  Initial Focused Assessment: some relief, bil BS still decreased with audible wheezing   Interventions: CxR done and pt. Assisted back to bed   Event Summary: pt. More comfortable, talking, wheezing improved. Bil. BS decreased especially on left with mild rales heard on left.  Continuing to monitor awaiting CxR results Name of Physician Notified: Dr. Bard Herbert and Dr. Delton Coombes at  (prior to arrival RRT)    at    Outcome: Stayed in room and stabalized    Mallie Darting

## 2012-07-31 NOTE — Progress Notes (Signed)
PULMONARY  / CRITICAL CARE MEDICINE  Name: [name] MRN: [id number] DOB: April 05, 1965    ADMISSION DATE:  07/10/2012 CONSULTATION DATE:  07/10/12  REFERRING MD :  [name] PRIMARY SERVICE: PCCM  CHIEF COMPLAINT:  Acute resp failure, tension ptx in setting advanced sarcoidosis  BRIEF PATIENT DESCRIPTION: 47 y/o male with PMH of sarcoidosis, OSA, HTN, and home O2 use who came into [facility] with ARF r/t tension pneumothorax.  Patient was intubated and chest tube placed by [name]>  SIGNIFICANT EVENTS / STUDIES:  6/17 ct chest: 10.1 x 7.8 cm thin-walled cavitary/bullous lesion in the posterior right upper lobe, unchanged. Confluent perihilar opacities with traction bronchiectasis and architectural distortion, related to known sarcoidosis. Associated peribronchovascular nodularity bilaterally. 6/30 -  tension PTX, resp failure, chest tube placed, intubated. 7/1 -  Extubated 7/7 - VATS ([facility]) - stapling of L apical bleb, mechanical pleurodesis 7/15 8 beat run of VT, asymptomatic 7/16 Chest tube removed 7/20> he is c/o incr pain epig & lower chest, sharp/ cut off his breath/ not relieved by pain meds or GI rx; plan CT chest & abd... 7/20 CT chest >> moderate L effusion with some apparent anterior loculation, some increase in L superior segmental opacity, no real change in chronic mediastinal LAD, R side opacities 7/20 CT abdomen >> subcutaneous air, otherwise negative  LINES / TUBES: 6/30 ETT >> 7/1 6/30 L chest tube >> 7/7 7/7 L chest tubes (Dr Laneta Simmers) >> 7/16 6/30 L subclavian CVC >> 7/15  CULTURES: 6/30 resp>>> abundant staph, abundant pseudomonas 6/30 BC >> NEG 7/13 Cdiff >>neg  ANTIBIOTICS: Ceftazidime 6/30>>>7/2 vanc 6/30>>>7/1 levaquin 7/2>>> d/ced Cefuroxime 7/7 >> post-op only  SUBJECTIVE:  Pt continues to have superior mid abd pain, L pleuritic pain. Has had several episodes of acute dyspnea that appear to correlate with poor pain control. He has UA noise and stridor during  conversation today.  Note Ct scan chest and abd performed 7/20  VITAL SIGNS: Temp:  [98.4 F (36.9 C)-101.5 F (38.6 C)] 101.5 F (38.6 C) (07/21 0922) Pulse Rate:  [76-129] 118 (07/21 0922) Resp:  [15-20] 20 (07/21 0922) BP: (99-189)/(59-125) 115/70 mmHg (07/21 0922) SpO2:  [77 %-100 %] 96 % (07/21 0922) Weight:  [78.654 kg (173 lb 6.4 oz)-83.3 kg (183 lb 10.3 oz)] 83.3 kg (183 lb 10.3 oz) (07/21 4696)  Intake/Output Summary (Last 24 hours) at 07/31/12 0950 Last data filed at 07/31/12 0836  Gross per 24 hour  Intake    960 ml  Output    300 ml  Net    660 ml     PHYSICAL EXAMINATION: General: NAD, mild distress  Neuro: alert, oriented, moves all 4. Anxious when he has increase in L CP HEENT: WNL, voice hoarse, UA noise / stridor Cardiovascular: RRR s M Lungs: decreased BS but no overt wheezing, referred UA noise Abdomen: soft, NT, NABS Ext: warm, No edema, no cyanosis  LABS:  Recent Labs Lab 07/30/12 1422  NA 132*  K 4.4  CL 95*  CO2 26  BUN 12  CREATININE 1.02  GLUCOSE 91    Recent Labs Lab 07/25/12 0309 07/29/12 0430  HGB 10.7* 9.4*  HCT 34.3* 29.1*  WBC 7.3 9.3  PLT 386 328    Recent Labs Lab 07/25/12 0309 07/28/12 1105 07/29/12 0430 07/30/12 0605  PROCALCITON  --  0.40  --  0.43  WBC 7.3  --  9.3  --    No results found for this basename: GLUCAP,  in the last 168 hours  CXR:  Ct Chest W Contrast  07/30/2012   *RADIOLOGY REPORT*  Clinical Data:  Respiratory failure.  History of sarcoidosis. Status post left chest tube removal 07/26/2012.  Worsening lower chest and epigastric pain.  CT CHEST, ABDOMEN AND PELVIS WITH CONTRAST  Technique:  Multidetector CT imaging of the chest, abdomen and pelvis was performed following the standard protocol during bolus administration of intravenous contrast.  Contrast: OMNIPAQUE IOHEXOL 300 MG/ML  SOLN  Comparison:  Multiple prior plain films of the chest, last 07/29/2012.  CT chest 06/27/2012 and  02/14/1947.  CT CHEST  Findings:  Multiple small mediastinal lymph nodes some which are calcified not notably changed.  Index AP window node on image 16 measures 1.0 cm.  AP window node on image 17 measures 0.7 cm.  No enlarging are or new lymphadenopathy identified.  Since the prior CT scan, the patient has developed a moderate left pleural effusion.  A component of effusion in the anterior chest wall measuring 8.6 cm AP by 3.5 cm transverse by 7.3 cm cranial- caudal has a lenticular configuration suggesting loculation.  There is no right pleural effusion or pericardial effusion.  Heart size is normal.  Lungs again demonstrate a large bullous lesion in the posterior right upper lobe which is unchanged.  Confluent perihilar opacities are again seen.  Airspace opacity in the superior segment left lower lobe appears mildly more extensive than on the most recent comparison CT scan but not markedly different than on the 02/14/2012 exam. Opacity in the right lower lobe is unchanged. Peribronchovascular nodularity is again seen.  Subcutaneous edema is present about the chest and worse on the left.  No pneumothorax is identified. No focal bony abnormality is identified  IMPRESSION:  1.  Increase in a small to moderate left pleural effusion since most recent examination.  Component along the anterior chest wall has a lenticular configuration and may be loculated. 2.  Perihilar opacities and traction bronchiectasis consistent with history of sarcoidosis.  Extent of opacity in the superior segment left lower lobe appears somewhat increased since the 06/27/2012 study but not notably changed compared 02/14/12.  This could be due to increased atelectasis or pneumonia. 3.  Negative for pneumothorax.  Extensive subcutaneous air about the chest noted.  CT ABDOMEN AND PELVIS  Findings:  The gallbladder, liver, spleen, adrenal glands, pancreas and kidneys all appear normal.  No lymphadenopathy or fluid is identified.  No free  intraperitoneal air is seen.  There is extensive subcutaneous emphysema diffusely about the abdomen. Visualized small and large bowel loops are unremarkable.  No focal bony abnormality.  IMPRESSION: Diffuse subcutaneous edema about the abdomen.  The abdomen is otherwise negative.   Original Report Authenticated By: Holley Dexter, M.D.   Ct Abdomen W Contrast  07/30/2012   *RADIOLOGY REPORT*  Clinical Data:  Respiratory failure.  History of sarcoidosis. Status post left chest tube removal 07/26/2012.  Worsening lower chest and epigastric pain.  CT CHEST, ABDOMEN AND PELVIS WITH CONTRAST  Technique:  Multidetector CT imaging of the chest, abdomen and pelvis was performed following the standard protocol during bolus administration of intravenous contrast.  Contrast: OMNIPAQUE IOHEXOL 300 MG/ML  SOLN  Comparison:  Multiple prior plain films of the chest, last 07/29/2012.  CT chest 06/27/2012 and 02/14/1947.  CT CHEST  Findings:  Multiple small mediastinal lymph nodes some which are calcified not notably changed.  Index AP window node on image 16 measures 1.0 cm.  AP window node on image 17 measures 0.7  cm.  No enlarging are or new lymphadenopathy identified.  Since the prior CT scan, the patient has developed a moderate left pleural effusion.  A component of effusion in the anterior chest wall measuring 8.6 cm AP by 3.5 cm transverse by 7.3 cm cranial- caudal has a lenticular configuration suggesting loculation.  There is no right pleural effusion or pericardial effusion.  Heart size is normal.  Lungs again demonstrate a large bullous lesion in the posterior right upper lobe which is unchanged.  Confluent perihilar opacities are again seen.  Airspace opacity in the superior segment left lower lobe appears mildly more extensive than on the most recent comparison CT scan but not markedly different than on the 02/14/2012 exam. Opacity in the right lower lobe is unchanged. Peribronchovascular nodularity is again  seen.  Subcutaneous edema is present about the chest and worse on the left.  No pneumothorax is identified. No focal bony abnormality is identified  IMPRESSION:  1.  Increase in a small to moderate left pleural effusion since most recent examination.  Component along the anterior chest wall has a lenticular configuration and may be loculated. 2.  Perihilar opacities and traction bronchiectasis consistent with history of sarcoidosis.  Extent of opacity in the superior segment left lower lobe appears somewhat increased since the 06/27/2012 study but not notably changed compared 02/14/12.  This could be due to increased atelectasis or pneumonia. 3.  Negative for pneumothorax.  Extensive subcutaneous air about the chest noted.  CT ABDOMEN AND PELVIS  Findings:  The gallbladder, liver, spleen, adrenal glands, pancreas and kidneys all appear normal.  No lymphadenopathy or fluid is identified.  No free intraperitoneal air is seen.  There is extensive subcutaneous emphysema diffusely about the abdomen. Visualized small and large bowel loops are unremarkable.  No focal bony abnormality.  IMPRESSION: Diffuse subcutaneous edema about the abdomen.  The abdomen is otherwise negative.   Original Report Authenticated By: Holley Dexter, M.D.   Dg Chest Port 1 View  07/31/2012   *RADIOLOGY REPORT*  Clinical Data: status post pneumo, shortness of breath  PORTABLE CHEST - 1 VIEW  Comparison: Prior radiograph from 07/29/2012 and CT from 07/30/2012.  Findings: Cardiac silhouette is grossly stable.  Bullous changes involving the right upper lung are grossly unchanged.  A left pleural effusion is again seen, layering at the lateral left hemithorax, grossly similar as compared to the prior CT.  This effusion may be partially loculated.  There has been interval worsening of parenchymal airspace opacity involving the left lower lobe and lingula, worrisome for progressive pneumonia., opacity within the right lung is not significantly  changed. No pneumothorax is identified.  Subcutaneous emphysema is again noted within the visualized right chest wall.  IMPRESSION: 1.  Interval worsening of consolidative airspace disease involving the left lower lobe and lingula, worrisome for progressive pneumonia. 2.  Similar appearance of left pleural effusion, which may be partially loculated. 3.  No pneumothorax.   Original Report Authenticated By: Rise Mu, M.D.   Dg Chest Port 1 View  07/29/2012   *RADIOLOGY REPORT*  Clinical Data: Evaluate for pneumothorax  PORTABLE CHEST - 1 VIEW  Comparison: 07/29/2012 at 11:05 a.m.  Findings: No pneumothorax is seen.  Bilateral mid and lower lung zone coarse reticular and patchy airspace opacities are stable.  There is a probable small loculated left pleural effusion.  Subcutaneous air is unchanged.  IMPRESSION: No pneumothorax is seen.  No change from the earlier study.   Original Report Authenticated By: Amie Portland,  M.D.   Dg Chest Port 1 View  07/29/2012   *RADIOLOGY REPORT*  Clinical Data: Dyspnea.  History of sarcoid.  PORTABLE CHEST - 1 VIEW  Comparison: 07/28/2012  Findings: The heart size is normal.  There are decreased lung volumes noted.  Extensive bilateral interstitial and airspace opacities are again noted and appear unchanged from previous exam.  This predominately affects the mid lungs and lower lung zones.  Subcutaneous emphysema within the chest wall is unchanged from previous exam.  IMPRESSION:  1.  No change in the bilateral interstitial and airspace densities.   Original Report Authenticated By: Signa Kell, M.D.    ASSESSMENT / PLAN:  Acute on Chronic Respiratory failure ES fibrocavitary sarcoidosis, had been off daily steroids as of March 2014 (until this hosp) Left spont PTX c/b extensive subq emphysema  Post VATS, L apical bleb stapling/machanical pleurodesis 7/7 CAP vs purulent bronchitis, treated 6/30 - 7/7 OSA, usually on CPAP Post-op fever, L effusion with  areas of loculation. Pct 0.4 > indeterminate   P:   Continue Duragesic 50 Q3d-  D/c ativan Add back toradol and follow BMP to insure no change renal fxn; could be an issue with GERD Baclofen 10mg  Tid prn musc spasm at pt's request Cont supplemental O2 Cont BDs, Flovent (as substitute for QVAR) Cont Reflux max regimen > add pepcid to protonix 7/21 Will ask TCTS to review his CT, comment on L pleural space Low threshold to cover w abx give changing L pleural space and fever  HTN AF > NSR NSVT - one brief asymptomatic episode 7/15. No further W/U indicated for now P:  Cont metoprolol Cont PRN hydralazine  Anemia   P:  Monitor CBC   Fever, ? Pleurisy but need to consider HCAP, empyema post-op P: Follow cbc and PCT Low threshold to add abx for HCAP +/- L pleural space infxn   Levy Pupa, MD, PhD 07/31/2012, 10:16 AM [facility] [phone] or if no answer [phone]

## 2012-07-31 NOTE — Progress Notes (Addendum)
Point of Contact CCM Note:  Called to bedside due to patient awakening with significant distress due to dyspnea and hypoxia.  Rapid response at bedside, patient with significant wheezing and hypoxia into th 70's at arrival, patient given xopenex with rapid improvement of wheezing.  At MD arival, patient calm without respiratory distress, hypoxia, saturating >95% SpO2 on 3L [place].  Patient answering questions appropratly and appears relaxed without issues.  However, patient with minimal exertion leading to desaturation and audible wheezing.  Rapid improvement once patient discontinues activity with resolution of wheezing, hyoxia, and tachypnea.  VSS, nontoxic, anicteric, on [place] (3L) Regular rhythm, notable tachycardia in 110's, no MRG, no heaves, normal S1S2 Decreased breath sounds, particularly on the left, with minimal rales.  Resolved wheezes.  No rhonchi.  No current accessory muscle usage Abd non tender, non distended, normal bowel sounds.   LE without edema, warm, dry  Plan on continuing current care.  Patient with evolving CXR changes with worsening hypoxia over the last few days.  Intermittent and then resolves.  Patient with noted left pleural effusion, possibly inflammatory in nature exacerbating these episodes vs atelectasis.  Discussed with on call physician.  Currently clinical picture stabilized.  Provide FiO2 to keep SpO2 >92%, bed rest, and xopenex prn.  If further issues, will move patient to MIU bed, otherwise will discuss with day team.  Leontine Locket, MD

## 2012-07-31 NOTE — Progress Notes (Signed)
ANTIBIOTIC CONSULT NOTE - INITIAL  Pharmacy Consult for cefepime and vancomycin Indication: pneumonia  Allergies  Allergen Reactions  . Clindamycin/Lincomycin Swelling    Patient Measurements: Height: 5' 7.5" (171.5 cm) Weight: 183 lb 10.3 oz (83.3 kg) IBW/kg (Calculated) : 67.25   Vital Signs: Temp: 101.5 F (38.6 C) (07/21 0922) Temp src: Oral (07/21 0922) BP: 115/70 mmHg (07/21 0922) Pulse Rate: 118 (07/21 0922) Intake/Output from previous day: 07/20 0701 - 07/21 0700 In: 960 [P.O.:960] Out: -  Intake/Output from this shift: Total I/O In: -  Out: 300 [Urine:300]  Labs:  Recent Labs  07/29/12 0430 07/30/12 1422  WBC 9.3  --   HGB 9.4*  --   PLT 328  --   CREATININE  --  1.02   Estimated Creatinine Clearance: 93.3 ml/min (by C-G formula based on Cr of 1.02). No results found for this basename: VANCOTROUGH, Leodis Binet, VANCORANDOM, GENTTROUGH, GENTPEAK, GENTRANDOM, TOBRATROUGH, TOBRAPEAK, TOBRARND, AMIKACINPEAK, AMIKACINTROU, AMIKACIN,  in the last 72 hours   Microbiology: Recent Results (from the past 720 hour(s))  MRSA PCR SCREENING     Status: None   Collection Time    07/10/12  5:00 PM      Result Value Range Status   MRSA by PCR NEGATIVE  NEGATIVE Final   Comment:            The GeneXpert MRSA Assay (FDA     approved for NASAL specimens     only), is one component of a     comprehensive MRSA colonization     surveillance program. It is not     intended to diagnose MRSA     infection nor to guide or     monitor treatment for     MRSA infections.  CULTURE, BLOOD (ROUTINE X 2)     Status: None   Collection Time    07/10/12  5:15 PM      Result Value Range Status   Specimen Description BLOOD LEFT HAND   Final   Special Requests BOTTLES DRAWN AEROBIC AND ANAEROBIC 10CC   Final   Culture  Setup Time 07/10/2012 22:03   Final   Culture NO GROWTH 5 DAYS   Final   Report Status 07/16/2012 FINAL   Final  CULTURE, BLOOD (ROUTINE X 2)     Status: None    Collection Time    07/10/12  5:30 PM      Result Value Range Status   Specimen Description BLOOD LEFT ARM   Final   Special Requests BOTTLES DRAWN AEROBIC AND ANAEROBIC 10CC   Final   Culture  Setup Time 07/10/2012 22:02   Final   Culture NO GROWTH 5 DAYS   Final   Report Status 07/16/2012 FINAL   Final  CULTURE, RESPIRATORY (NON-EXPECTORATED)     Status: None   Collection Time    07/10/12  6:44 PM      Result Value Range Status   Specimen Description TRACHEAL ASPIRATE   Final   Special Requests Immunocompromised   Final   Gram Stain     Final   Value: FEW WBC PRESENT, PREDOMINANTLY PMN     NO SQUAMOUS EPITHELIAL CELLS SEEN     FEW GRAM POSITIVE COCCI IN PAIRS   Culture     Final   Value: ABUNDANT STAPHYLOCOCCUS AUREUS     Note: RIFAMPIN AND GENTAMICIN SHOULD NOT BE USED AS SINGLE DRUGS FOR TREATMENT OF STAPH INFECTIONS.     ABUNDANT PSEUDOMONAS AERUGINOSA   Report  Status 07/14/2012 FINAL   Final   Organism ID, Bacteria STAPHYLOCOCCUS AUREUS   Final   Organism ID, Bacteria PSEUDOMONAS AERUGINOSA   Final  CLOSTRIDIUM DIFFICILE BY PCR     Status: None   Collection Time    07/23/12  4:27 AM      Result Value Range Status   C difficile by pcr NEGATIVE  NEGATIVE Final    Medical History: Past Medical History  Diagnosis Date  . Hypertension   . Sarcoidosis   . OSA on CPAP   . Chronic respiratory failure     home oxygen PRN  . Bronchitis   . Pneumonia     Assessment: 5 YOM with hx of advanced sarcoidosis admitted with tension pneumothorax. Previously completed antibiotics for pseudomonas in resp culture. Now had an episode of desaturation overnight and CXR shows L infiltrate. Renal function is stable. No new cultures  Goal of Therapy:  Vancomycin trough level 15-20 mcg/ml Eradication of infection  Plan:  1. Cefepime 1g IV Q8h 2. Vancomycin 1000mg  IV Q8hr 3. Vanc trough when clinically indicated 4. Follow up cultures and sensitivities as well as renal  function  Abimelec Grochowski D. Marchello Rothgeb, PharmD Clinical Pharmacist Pager: [phone] 07/31/2012 10:38 AM

## 2012-07-31 NOTE — Significant Event (Signed)
Rapid Response Event Note  Overview: Time Called: 0005 Arrival Time: 0010 Event Type: Respiratory  Initial Focused Assessment:   Interventions:   Event Summary: Pt. Able to get OOB and walk to BR, had BM and back to bed. Resting quietly, in good spirits awaiting MD Name of Physician Notified: Dr. Bard Herbert and Dr. Delton Coombes at  (prior to arrival RRT)    at    Outcome: Stayed in room and stabalized  Event End Time: 0945  Mallie Darting

## 2012-07-31 NOTE — Progress Notes (Addendum)
14 Days Post-Op Procedure(s) (LRB): VIDEO ASSISTED THORACOSCOPY (VATS)/BLEB Stapling (Left) Subjective: CT scan reviewed Patient examined Would proceed with U/S directed thoracentesis of L anterior pleural collection for culture Objective: Vital signs in last 24 hours: Temp:  [98.6 F (37 C)-101.5 F (38.6 C)] 99.2 F (37.3 C) (07/21 1329) Pulse Rate:  [91-129] 91 (07/21 1329) Cardiac Rhythm:  [-]  Resp:  [15-20] 20 (07/21 1329) BP: (105-189)/(64-125) 124/87 mmHg (07/21 1329) SpO2:  [77 %-100 %] 99 % (07/21 1438) Weight:  [173 lb 6.4 oz (78.654 kg)-183 lb 10.3 oz (83.3 kg)] 183 lb 10.3 oz (83.3 kg) (07/21 0922)  Hemodynamic parameters for last 24 hours:  fever  Intake/Output from previous day: 07/20 0701 - 07/21 0700 In: 960 [P.O.:960] Out: -  Intake/Output this shift: Total I/O In: 240 [P.O.:240] Out: 300 [Urine:300]    Lab Results:  Recent Labs  07/29/12 0430  WBC 9.3  HGB 9.4*  HCT 29.1*  PLT 328   BMET:  Recent Labs  07/30/12 1422  NA 132*  K 4.4  CL 95*  CO2 26  GLUCOSE 91  BUN 12  CREATININE 1.02  CALCIUM 9.3    PT/INR: No results found for this basename: LABPROT, INR,  in the last 72 hours ABG    Component Value Date/Time   PHART 7.421 07/18/2012 0435   HCO3 32.8* 07/18/2012 0435   TCO2 34 07/18/2012 0435   ACIDBASEDEF 4.5* 07/11/2012 0406   O2SAT 98.0 07/18/2012 0435   CBG (last 3)  No results found for this basename: GLUCAP,  in the last 72 hours  Assessment/Plan: S/P Procedure(s) (LRB): VIDEO ASSISTED THORACOSCOPY (VATS)/BLEB Stapling (Left)    LOS: 21 days    VAN TRIGT III,Hilary Milks 07/31/2012

## 2012-07-31 NOTE — Significant Event (Signed)
Rapid Response Event Note  Overview: Time Called: 0005 Arrival Time: 0010 Event Type: Respiratory  Initial Focused Assessment:  Pt. States he is more comfortable and request decrease in O2 back to nasal cannula, will trial Study [place] 3L applied (pt. States 4L is "too much") and O2 sats are remaining at 96%   Interventions:   Event Summary: Name of Physician Notified: Dr. Bard Herbert and Dr. Delton Coombes at  (prior to arrival RRT)    at    Outcome: Stayed in room and stabalized  Event End Time: 0945  Mallie Darting

## 2012-07-31 NOTE — Significant Event (Signed)
Rapid Response Event Note  Overview: Time Called: 0005 Arrival Time: 0010 Event Type: Respiratory  Initial Focused Assessment:   Interventions: Spoke with Dr. Darrick Penna and provided her with pt. Current assesment and CxR results   Event Summary: Dr. Leamon Arnt informed me that "fellow"  Will be by to see him Name of Physician Notified: Dr. Bard Herbert and Dr. Delton Coombes at  (prior to arrival RRT)    at    Outcome: Stayed in room and stabalized  Event End Time: 0945  Mallie Darting

## 2012-07-31 NOTE — Progress Notes (Signed)
RT set up CPAP for patient.  Patients home settings are 10 CMH2O but patient feels that this is to much pressure since he just had surgery.  RT set machine up to 5 cmH2O and patient was comfortable with these settings.  Stated that he would place himself on once he was ready to sleep.

## 2012-07-31 NOTE — Significant Event (Signed)
Rapid Response Event Note  Overview: Time Called: 0005 Arrival Time: 0010 Event Type: Respiratory  Initial Focused Assessment: Pt. OOB in chair receiving resp tx, SOB   Interventions: after resp. Tx, remains on NRB with O2 96-100%. CxR ordered   Event Summary: Name of Physician Notified: Dr. Bard Herbert and Dr. Delton Coombes at  (prior to arrival RRT)    at    Outcome: Stayed in room and stabalized  Event End Time: 0945  Mallie Darting

## 2012-07-31 NOTE — Significant Event (Signed)
Rapid Response Event Note  Overview: Time Called: 0005 Arrival Time: 0010 Event Type: Respiratory  Initial Focused Assessment:   Interventions:   Event Summary: 0120 Dr. Ala Dach in to evaluate pt.  Pt. desats to 80's with any exertion, otherwise sats at 95-96% on 3L O2. VSS at this time, but still with intermitten audible wheezing and dec BBS, especially on LUL and LLL  Name of Physician Notified: Dr. Bard Herbert and Dr. Delton Coombes at  (prior to arrival RRT)    at    Outcome: Stayed in room and stabalized  Event End Time: 0945  [name]

## 2012-08-01 ENCOUNTER — Inpatient Hospital Stay (HOSPITAL_COMMUNITY): Payer: 59

## 2012-08-01 ENCOUNTER — Encounter (HOSPITAL_COMMUNITY): Payer: Self-pay | Admitting: Radiology

## 2012-08-01 LAB — PROCALCITONIN: Procalcitonin: 1.04 ng/mL

## 2012-08-01 NOTE — Progress Notes (Signed)
RT removed CPAP bc pt states he is not tolerating well..  RT informed pt it can be brought back at anytime if he changes his mind.

## 2012-08-01 NOTE — Progress Notes (Signed)
Zenovia Jarred, PT, DPT 08/01/2012 Pager: [phone]

## 2012-08-01 NOTE — Procedures (Signed)
Technically success US guided aspiration of loculated fluid collection within the anterior superior aspect of the left hemithorax. Approximately 90 cc of serous colored fluid aspirated and sent to lab for analysis. No immediate post procedural complications.

## 2012-08-01 NOTE — Progress Notes (Signed)
OT/PT Cancellation Note  Patient Details Name: [name] MRN: [id number] DOB: 1965/11/04   Cancelled Treatment:    Reason Eval/Treat Not Completed: Fatigue/lethargy limiting ability to participate. Pt just finished getting washed up with A of family member and says he is too tired/SOB/ increased pain to do therapy now. Would prefer that we try back tomorrow.  Evette Georges [phone] 08/01/2012, 2:26 PM

## 2012-08-01 NOTE — Progress Notes (Signed)
PULMONARY  / CRITICAL CARE MEDICINE  Name: [name] MRN: [id number] DOB: 01-May-1965    ADMISSION DATE:  07/10/2012 CONSULTATION DATE:  07/10/12  REFERRING MD :  [name] PRIMARY SERVICE: PCCM  CHIEF COMPLAINT:  Acute resp failure, tension ptx in setting advanced sarcoidosis  BRIEF PATIENT DESCRIPTION: 47 y/o male with PMH of sarcoidosis, OSA, HTN, and home O2 use who came into [facility] with ARF r/t tension pneumothorax.  Patient was intubated and chest tube placed by [name]>  SIGNIFICANT EVENTS / STUDIES:  6/17 ct chest: 10.1 x 7.8 cm thin-walled cavitary/bullous lesion in the posterior right upper lobe, unchanged. Confluent perihilar opacities with traction bronchiectasis and architectural distortion, related to known sarcoidosis. Associated peribronchovascular nodularity bilaterally. 6/30 -  tension PTX, resp failure, chest tube placed, intubated. 7/1 -  Extubated 7/7 - VATS ([facility]) - stapling of L apical bleb, mechanical pleurodesis 7/15 8 beat run of VT, asymptomatic 7/16 Chest tube removed 7/20> he is c/o incr pain epig & lower chest, sharp/ cut off his breath/ not relieved by pain meds or GI rx; plan CT chest & abd... 7/20 CT chest >> moderate L effusion with some apparent anterior loculation, some increase in L superior segmental opacity, no real change in chronic mediastinal LAD, R side opacities 7/20 CT abdomen >> subcutaneous air, otherwise negative  LINES / TUBES: 6/30 ETT >> 7/1 6/30 L chest tube >> 7/7 7/7 L chest tubes (Dr Laneta Simmers) >> 7/16 6/30 L subclavian CVC >> 7/15  CULTURES: 6/30 resp>>> abundant staph, abundant pseudomonas 6/30 BC >> NEG 7/13 Cdiff >>neg  ANTIBIOTICS: Ceftazidime 6/30>>>7/2 vanc 6/30>>>7/1 levaquin 7/2>>> d/ced Cefuroxime 7/7 >> post-op only  SUBJECTIVE:  Pt continues to have superior mid abd pain, L pleuritic pain. Has had several episodes of acute dyspnea that appear to correlate with poor pain control. He has UA noise and stridor during  conversation today.  Note Ct scan chest and abd performed 7/20  VITAL SIGNS: Temp:  [97.3 F (36.3 C)-99.4 F (37.4 C)] 98.8 F (37.1 C) (07/22 0740) Pulse Rate:  [93-104] 93 (07/22 0740) Resp:  [18-20] 18 (07/22 0740) BP: (100-138)/(58-92) 100/58 mmHg (07/22 0740) SpO2:  [94 %-99 %] 98 % (07/22 0740) Weight:  [78.064 kg (172 lb 1.6 oz)] 78.064 kg (172 lb 1.6 oz) (07/21 2219)  Intake/Output Summary (Last 24 hours) at 08/01/12 1334 Last data filed at 07/31/12 2100  Gross per 24 hour  Intake    460 ml  Output      0 ml  Net    460 ml     PHYSICAL EXAMINATION: General: NAD, mild distress  Neuro: alert, oriented, moves all 4. Anxious when he has increase in L CP HEENT: WNL, voice hoarse, UA noise / stridor Cardiovascular: RRR s M Lungs: decreased BS but no overt wheezing, referred UA noise Abdomen: soft, NT, NABS Ext: warm, No edema, no cyanosis  LABS:  Recent Labs Lab 07/30/12 1422  NA 132*  K 4.4  CL 95*  CO2 26  BUN 12  CREATININE 1.02  GLUCOSE 91    Recent Labs Lab 07/29/12 0430  HGB 9.4*  HCT 29.1*  WBC 9.3  PLT 328    Recent Labs Lab 07/28/12 1105 07/29/12 0430 07/30/12 0605 08/01/12 0500  PROCALCITON 0.40  --  0.43 1.04  WBC  --  9.3  --   --    No results found for this basename: GLUCAP,  in the last 168 hours CXR:  Ct Chest W Contrast  07/30/2012   *  RADIOLOGY REPORT*  Clinical Data:  Respiratory failure.  History of sarcoidosis. Status post left chest tube removal 07/26/2012.  Worsening lower chest and epigastric pain.  CT CHEST, ABDOMEN AND PELVIS WITH CONTRAST  Technique:  Multidetector CT imaging of the chest, abdomen and pelvis was performed following the standard protocol during bolus administration of intravenous contrast.  Contrast: OMNIPAQUE IOHEXOL 300 MG/ML  SOLN  Comparison:  Multiple prior plain films of the chest, last 07/29/2012.  CT chest 06/27/2012 and 02/14/1947.  CT CHEST  Findings:  Multiple small mediastinal lymph nodes  some which are calcified not notably changed.  Index AP window node on image 16 measures 1.0 cm.  AP window node on image 17 measures 0.7 cm.  No enlarging are or new lymphadenopathy identified.  Since the prior CT scan, the patient has developed a moderate left pleural effusion.  A component of effusion in the anterior chest wall measuring 8.6 cm AP by 3.5 cm transverse by 7.3 cm cranial- caudal has a lenticular configuration suggesting loculation.  There is no right pleural effusion or pericardial effusion.  Heart size is normal.  Lungs again demonstrate a large bullous lesion in the posterior right upper lobe which is unchanged.  Confluent perihilar opacities are again seen.  Airspace opacity in the superior segment left lower lobe appears mildly more extensive than on the most recent comparison CT scan but not markedly different than on the 02/14/2012 exam. Opacity in the right lower lobe is unchanged. Peribronchovascular nodularity is again seen.  Subcutaneous edema is present about the chest and worse on the left.  No pneumothorax is identified. No focal bony abnormality is identified  IMPRESSION:  1.  Increase in a small to moderate left pleural effusion since most recent examination.  Component along the anterior chest wall has a lenticular configuration and may be loculated. 2.  Perihilar opacities and traction bronchiectasis consistent with history of sarcoidosis.  Extent of opacity in the superior segment left lower lobe appears somewhat increased since the 06/27/2012 study but not notably changed compared 02/14/12.  This could be due to increased atelectasis or pneumonia. 3.  Negative for pneumothorax.  Extensive subcutaneous air about the chest noted.  CT ABDOMEN AND PELVIS  Findings:  The gallbladder, liver, spleen, adrenal glands, pancreas and kidneys all appear normal.  No lymphadenopathy or fluid is identified.  No free intraperitoneal air is seen.  There is extensive subcutaneous emphysema  diffusely about the abdomen. Visualized small and large bowel loops are unremarkable.  No focal bony abnormality.  IMPRESSION: Diffuse subcutaneous edema about the abdomen.  The abdomen is otherwise negative.   Original Report Authenticated By: Holley Dexter, M.D.   Ct Abdomen W Contrast  07/30/2012   *RADIOLOGY REPORT*  Clinical Data:  Respiratory failure.  History of sarcoidosis. Status post left chest tube removal 07/26/2012.  Worsening lower chest and epigastric pain.  CT CHEST, ABDOMEN AND PELVIS WITH CONTRAST  Technique:  Multidetector CT imaging of the chest, abdomen and pelvis was performed following the standard protocol during bolus administration of intravenous contrast.  Contrast: OMNIPAQUE IOHEXOL 300 MG/ML  SOLN  Comparison:  Multiple prior plain films of the chest, last 07/29/2012.  CT chest 06/27/2012 and 02/14/1947.  CT CHEST  Findings:  Multiple small mediastinal lymph nodes some which are calcified not notably changed.  Index AP window node on image 16 measures 1.0 cm.  AP window node on image 17 measures 0.7 cm.  No enlarging are or new lymphadenopathy identified.  Since the prior CT scan, the patient has developed a moderate left pleural effusion.  A component of effusion in the anterior chest wall measuring 8.6 cm AP by 3.5 cm transverse by 7.3 cm cranial- caudal has a lenticular configuration suggesting loculation.  There is no right pleural effusion or pericardial effusion.  Heart size is normal.  Lungs again demonstrate a large bullous lesion in the posterior right upper lobe which is unchanged.  Confluent perihilar opacities are again seen.  Airspace opacity in the superior segment left lower lobe appears mildly more extensive than on the most recent comparison CT scan but not markedly different than on the 02/14/2012 exam. Opacity in the right lower lobe is unchanged. Peribronchovascular nodularity is again seen.  Subcutaneous edema is present about the chest and worse on the  left.  No pneumothorax is identified. No focal bony abnormality is identified  IMPRESSION:  1.  Increase in a small to moderate left pleural effusion since most recent examination.  Component along the anterior chest wall has a lenticular configuration and may be loculated. 2.  Perihilar opacities and traction bronchiectasis consistent with history of sarcoidosis.  Extent of opacity in the superior segment left lower lobe appears somewhat increased since the 06/27/2012 study but not notably changed compared 02/14/12.  This could be due to increased atelectasis or pneumonia. 3.  Negative for pneumothorax.  Extensive subcutaneous air about the chest noted.  CT ABDOMEN AND PELVIS  Findings:  The gallbladder, liver, spleen, adrenal glands, pancreas and kidneys all appear normal.  No lymphadenopathy or fluid is identified.  No free intraperitoneal air is seen.  There is extensive subcutaneous emphysema diffusely about the abdomen. Visualized small and large bowel loops are unremarkable.  No focal bony abnormality.  IMPRESSION: Diffuse subcutaneous edema about the abdomen.  The abdomen is otherwise negative.   Original Report Authenticated By: Holley Dexter, M.D.   Ct Aspiration  08/01/2012   *RADIOLOGY REPORT*  Indication: History of pulmonary sarcoidosis and recent tension pneumothorax, post lung resection and pleurodesis, now with indeterminate anteriorly located loculated left-sided pleural effusion.  CT GUIDED ASPIRATION OF LOCULATED LEFT SIDED PLEURAL EFFUSION  Comparison: Chest CT - 07/30/2012  Intravenous Medications: None  Contrast: None  Complications: None immediate  TECHNIQUE/FINDINGS:  Preprocedural ultrasound scanning failed to adequately delineate the known anteriorly loculated pleural fluid within the left hemithorax, likely secondary to known overlying subcutaneous emphysema within the chest wall.  As such, the decision was made to perform the procedure under CT fluoroscopic guidance.   Informed  consent was obtained from the patient following an explanation of the procedure, risks, benefits and alternatives.  A time out was performed prior to the initiation of the procedure.  The patient was positioned supine on the CT table and a limited CT was performed for procedural planning demonstrating grossly unchanged appearance of small to moderate sized loculated pleural effusion within the anterior aspect of the left hemithorax.  The procedure was planned.  The operative site was prepped and draped in the usual sterile fashion.   Appropriate trajectory was confirmed with a 22 gauge spinal needle after the adjacent tissues were anesthetized with 1% Lidocaine with epinephrine.  Under intermittent CT guidance, a 5-French, 7 cm Yueh sheath needle was advanced into the anterior loculated left-sided pleural fluid collection and approximately 90 ml of serous fluid was aspirated. The aspirated fluid was tapped and sent to the laboratory for analysis.  Limited postprocedural imaging demonstrates near resolution of anterior loculated pleural fluid without development  of significant pneumothorax.  A dressing was placed.  The patient tolerated the procedure well without immediate postprocedural complication.  IMPRESSION:  Successful CT guided aspiration of loculated pleural fluid within the anterior aspect of the left hemithorax.   Original Report Authenticated By: Tacey Ruiz, MD   Dg Chest Port 1 View  07/31/2012   *RADIOLOGY REPORT*  Clinical Data: status post pneumo, shortness of breath  PORTABLE CHEST - 1 VIEW  Comparison: Prior radiograph from 07/29/2012 and CT from 07/30/2012.  Findings: Cardiac silhouette is grossly stable.  Bullous changes involving the right upper lung are grossly unchanged.  A left pleural effusion is again seen, layering at the lateral left hemithorax, grossly similar as compared to the prior CT.  This effusion may be partially loculated.  There has been interval worsening of parenchymal  airspace opacity involving the left lower lobe and lingula, worrisome for progressive pneumonia., opacity within the right lung is not significantly changed. No pneumothorax is identified.  Subcutaneous emphysema is again noted within the visualized right chest wall.  IMPRESSION: 1.  Interval worsening of consolidative airspace disease involving the left lower lobe and lingula, worrisome for progressive pneumonia. 2.  Similar appearance of left pleural effusion, which may be partially loculated. 3.  No pneumothorax.   Original Report Authenticated By: Rise Mu, M.D.    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 Pct 0.4 > indeterminate   P:   - Continue Duragesic 50 Q3d. - D/ced ativan due to mental status. - Continue toradol and follow BMP to insure no change renal fxn; could be an issue with GERD. - Baclofen 10mg  Tid prn musc spasm at pt's request. - Cont supplemental O2. - Cont BDs, Flovent (as substitute for QVAR). - Cont Reflux max regimen > added pepcid to protonix 7/21. - CVTS would like IR to thoracentese and send fluid for analysis.  HTN AF > NSR NSVT - one brief asymptomatic episode 7/15. No further W/U indicated for now P:  - Cont metoprolol. - Cont PRN hydralazine.  Anemia   P:  - Monitor CBC.  Fever, ? Pleurisy but need to consider HCAP, empyema post-op P: - Follow cbc and PCT. - Continue abx. - IR to address left sided effusion.  Alyson Reedy, M.D. [facility] Pulmonary/Critical Care Medicine. Pager: [phone]. After hours pager: [phone].

## 2012-08-02 ENCOUNTER — Inpatient Hospital Stay (HOSPITAL_COMMUNITY): Payer: 59

## 2012-08-02 LAB — BASIC METABOLIC PANEL
CO2: 30 mEq/L (ref 19–32)
Chloride: 97 mEq/L (ref 96–112)
GFR calc non Af Amer: 84 mL/min — ABNORMAL LOW (ref >90)
Glucose, Bld: 97 mg/dL (ref 70–99)
Potassium: 4.6 mEq/L (ref 3.5–5.1)
Sodium: 136 mEq/L (ref 135–145)

## 2012-08-02 LAB — CBC
HCT: 29.3 % — ABNORMAL LOW (ref 39.0–52.0)
Hemoglobin: 9.3 g/dL — ABNORMAL LOW (ref 13.0–17.0)
MCHC: 31.7 g/dL (ref 30.0–36.0)
RBC: 3.61 MIL/uL — ABNORMAL LOW (ref 4.22–5.81)
WBC: 7.4 10*3/uL (ref 4.0–10.5)

## 2012-08-02 LAB — MAGNESIUM: Magnesium: 2 mg/dL (ref 1.5–2.5)

## 2012-08-02 LAB — VANCOMYCIN, TROUGH: Vancomycin Tr: 19 ug/mL (ref 10.0–20.0)

## 2012-08-02 MED ORDER — PREDNISONE 20 MG PO TABS
40.0000 mg | ORAL_TABLET | Freq: Every day | ORAL | Status: DC
  Administered 2012-08-03 – 2012-08-04 (×2): 40 mg via ORAL
  Filled 2012-08-02 (×3): qty 2

## 2012-08-02 MED ORDER — GI COCKTAIL ~~LOC~~
30.0000 mL | Freq: Two times a day (BID) | ORAL | Status: AC
  Administered 2012-08-02 – 2012-08-03 (×3): 30 mL via ORAL
  Filled 2012-08-02 (×4): qty 30

## 2012-08-02 NOTE — Evaluation (Signed)
Physical Therapy Re-Evaluation Patient Details Name: [name] MRN: [id number] DOB: 17-Mar-1965 Today's Date: 08/02/2012 Time: 7829-5621 PT Time Calculation (min): 30 min  PT Assessment / Plan / Recommendation History of Present Illness  Pt adm with tension pneumothorax due to advanced sarcoidosis.  Underwent VATS and BLEB stapling. s/p CT aspiration pleural effusion  Clinical Impression  Pt with decline in respiratory status reporting chest pain and SOB with activity around room since previous evaluation.  Pt reluctant to ambulate in hallway however more agreeable with okay from RN.   Pt required multiple short standing rests breaks due to SOB.  Pt would benefit from acute PT services in order to safely mobilize pt and monitor vitals during activity to prepare pt for d/c home.  Pt encouraged to ambulate with staff as tolerated to improve endurance.    PT Assessment  Patient needs continued PT services    Follow Up Recommendations  No PT follow up    Does the patient have the potential to tolerate intense rehabilitation      Barriers to Discharge        Equipment Recommendations  None recommended by PT    Recommendations for Other Services     Frequency Min 2X/week    Precautions / Restrictions Precautions Precautions: Fall Precaution Comments: check sats Restrictions Weight Bearing Restrictions: No   Pertinent Vitals/Pain SaO2 at rest 93% room air however pt reports SOB with activity so applied to use restroom prior to hallway ambulation and SaO2 89% on 2L upon exiting bathroom.      Mobility  Bed Mobility Bed Mobility: Not assessed Details for Bed Mobility Assistance: pt sitting EOB upon entering room Transfers Transfers: Sit to Stand;Stand to Sit Sit to Stand: 5: Supervision;From bed;From toilet Stand to Sit: To bed;To toilet;5: Supervision Details for Transfer Assistance: rest breaks after transfers due to pain and SOB Ambulation/Gait Ambulation/Gait  Assistance: 5: Supervision Ambulation Distance (Feet): 280 Feet (total) Ambulation/Gait Assistance Details: pt performed 200 feet with 4 standing rest breaks with last break in room and then ambulated another 80 feet in hallway, pt reports SOB for which rest breaks taken, also pt increased O2 to 3L (pulse ox unable to pick up reading throughout gait) Gait Pattern: Step-through pattern Gait velocity: decreased Stairs: No    Exercises     PT Diagnosis: Difficulty walking  PT Problem List: Decreased activity tolerance;Decreased mobility;Cardiopulmonary status limiting activity;Pain PT Treatment Interventions: DME instruction;Gait training;Functional mobility training;Therapeutic activities;Therapeutic exercise;Stair training;Patient/family education     PT Goals(Current goals can be found in the care plan section) Acute Rehab PT Goals Patient Stated Goal: " To return home " PT Goal Formulation: With patient Time For Goal Achievement: 08/09/12 Potential to Achieve Goals: Good  Visit Information  Last PT Received On: 08/02/12 Assistance Needed: +1 History of Present Illness: Pt adm with tension pneumothorax due to advanced sarcoidosis.  Underwent VATS and BLEB stapling. s/p CT aspiration pleural effusion       Prior Functioning  Home Living Family/patient expects to be discharged to:: Private residence Living Arrangements: Alone Available Help at Discharge: Available PRN/intermittently Type of Home: House Home Layout: Two level Alternate Level Stairs-Number of Steps: 1 flight Alternate Level Stairs-Rails: Left Home Equipment: Other (comment) (O2) Prior Function Level of Independence: Independent Communication Communication: No difficulties Dominant Hand: Right    Cognition  Cognition Arousal/Alertness: Awake/alert Behavior During Therapy: WFL for tasks assessed/performed Overall Cognitive Status: Within Functional Limits for tasks assessed    Extremity/Trunk Assessment  Upper Extremity Assessment  Upper Extremity Assessment: Overall WFL for tasks assessed Lower Extremity Assessment Lower Extremity Assessment: Overall WFL for tasks assessed   Balance Balance Balance Assessed: Yes Dynamic Standing Balance Dynamic Standing - Balance Support: No upper extremity supported;During functional activity Dynamic Standing - Level of Assistance: 5: Stand by assistance  End of Session PT - End of Session Equipment Utilized During Treatment: Oxygen Activity Tolerance: Patient limited by fatigue Patient left: in bed;with call bell/phone within reach  GP     David Towson,KATHrine E 08/02/2012, 12:22 PM Zenovia Jarred, PT, DPT 08/02/2012 Pager: [phone]

## 2012-08-02 NOTE — Evaluation (Signed)
Occupational Therapy Evaluation Patient Details Name: [name] MRN: [id number] DOB: 07-13-1965 Today's Date: 08/02/2012 Time: 7829-5621 OT Time Calculation (min): 31 min  OT Assessment / Plan / Recommendation History of present illness Pt adm with tension pneumothorax due to advanced sarcoidosis.  Underwent VATS and BLEB stapling.   Clinical Impression   Pt demos decline in function with ADLs and ADL mobility safety with chest pain and SOB limiting function. Pt would benefit from acute OT services to address impairments to help restore PLOF to return home safely    OT Assessment  Patient needs continued OT Services    Follow Up Recommendations  No OT follow up    Barriers to Discharge   none  Equipment Recommendations       Recommendations for Other Services    Frequency  Min 2X/week    Precautions / Restrictions Precautions Precautions: Fall Restrictions Weight Bearing Restrictions: No   Pertinent Vitals/Pain 6/10 chest area    ADL  Grooming: Performed;Wash/dry hands;Wash/dry face;Supervision/safety Upper Body Bathing: Simulated;Supervision/safety;Set up Lower Body Bathing: Simulated;Supervision/safety;Set up;Min guard Upper Body Dressing: Performed;Supervision/safety;Set up Lower Body Dressing: Performed;Supervision/safety;Set up;Min guard Toilet Transfer: Research scientist (life sciences) Method: Sit to Barista: Regular height toilet;Grab bars Toileting - Clothing Manipulation and Hygiene: Performed;Supervision/safety Where Assessed - Engineer, mining and Hygiene: Standing Tub/Shower Transfer: Performed;Supervision/safety;Min guard Web designer Method: Science writer: Grab bars;Walk in shower Equipment Used: Other (comment) (O2)    OT Diagnosis: Generalized weakness;Acute pain  OT Problem List: Decreased knowledge of use of DME or AE;Pain;Decreased activity tolerance;Decreased  strength OT Treatment Interventions: Self-care/ADL training;Therapeutic exercise;Neuromuscular education;Therapeutic activities;Energy conservation;DME and/or AE instruction;Patient/family education   OT Goals(Current goals can be found in the care plan section) Acute Rehab OT Goals Patient Stated Goal: " To return home " OT Goal Formulation: With patient Time For Goal Achievement: 08/09/12 Potential to Achieve Goals: Good ADL Goals Pt Will Perform Grooming: with set-up;with modified independence;standing Pt Will Perform Upper Body Bathing: with set-up;with modified independence Pt Will Perform Lower Body Bathing: with set-up;with supervision;sit to/from stand Pt Will Perform Upper Body Dressing: with set-up;with modified independence Pt Will Perform Lower Body Dressing: with set-up;with supervision;sit to/from stand Pt Will Transfer to Toilet: Independently;with modified independence Pt Will Perform Toileting - Clothing Manipulation and hygiene: Independently Pt Will Perform Tub/Shower Transfer: with supervision;with modified independence Additional ADL Goal #1: Pt will verbalize and return demo energy conservation techniques for ADLs and for ADL mobility  Visit Information  Last OT Received On: 08/02/12 History of Present Illness: Pt adm with tension pneumothorax due to advanced sarcoidosis.  Underwent VATS and BLEB stapling.       Prior Functioning     Home Living Family/patient expects to be discharged to:: Private residence Living Arrangements: Alone Available Help at Discharge: Available PRN/intermittently Type of Home: House Home Layout: Two level Alternate Level Stairs-Number of Steps: 1 flight Alternate Level Stairs-Rails: Left Home Equipment: Other (comment) (O2) Prior Function Level of Independence: Independent Communication Communication: No difficulties Dominant Hand: Right         Vision/Perception Vision - History Baseline Vision: Wears glasses all the  time Visual History: Glaucoma;Cataracts Patient Visual Report: No change from baseline Perception Perception: Within Functional Limits   Cognition  Cognition Arousal/Alertness: Awake/alert Behavior During Therapy: WFL for tasks assessed/performed Overall Cognitive Status: Within Functional Limits for tasks assessed    Extremity/Trunk Assessment Upper Extremity Assessment Upper Extremity Assessment: Overall WFL for tasks assessed     Mobility Bed Mobility Bed Mobility:  Not assessed Details for Bed Mobility Assistance: pt sitting EOB upon entering room Transfers Transfers: Sit to Stand;Stand to Sit Sit to Stand: 5: Supervision;From bed;From toilet Stand to Sit: To bed;To toilet;5: Supervision Details for Transfer Assistance: pt requires rest breaks for O2 recovery druing ambulation for ADL mobility.     Exercise     Balance Balance Balance Assessed: Yes Dynamic Standing Balance Dynamic Standing - Balance Support: No upper extremity supported;During functional activity Dynamic Standing - Level of Assistance: 5: Stand by assistance   End of Session OT - End of Session Equipment Utilized During Treatment: Other (comment) (O2) Activity Tolerance: Patient tolerated treatment well;Patient limited by pain;Patient limited by fatigue Patient left: in bed;with call bell/phone within reach;with family/visitor present Nurse Communication: Mobility status  GO     Galen Manila 08/02/2012, 11:14 AM

## 2012-08-02 NOTE — Consult Note (Addendum)
[facility] Consult: 10:23 AM 08/03/2012   Referring Provider: Dr Delton Coombes Primary Care Physician:   Primary Gastroenterologist:  None   Reason for Consultation:  Epigastric pain.   HPI: [name] is a 47 y.o. male.  Has end stage sarcoid. Had been off steroids since 05/2012.  Admitted 6/30 with tension PTX.  Managed with chest tube, VATS and stapling of bleb 07/17/12. On vent 6/30 - 7/1.   Was for discharge home 7/17, this cancelled after developing pleuritic chest pain.  7/23 in AM it localized as epigastric pain.  This minimally eased with GI cocktail.  When intense,  the epigastric pain caused worsening of SOB .  The pain is not worse with movement.  However the lower chest pain present before 7/23 had been worsened by movement.  Since chest tube and surgery has also had pain in left thorax that confusingly radiates to lower sternum. However this is lightening speed and quickly dissipates within seconds.  This morning he says the epigastric pain is overall better.  No sores in his mouth. Some intermittent dysphagia since admission.  Breads, bananas, pills.  Not liquids.  Home meds included Omeprazole 40 mg BID and BID Famotidine.  As inpt getting the Pepcid BID and HS once daily Protonix. Had EGD a few years back at [facility].  He recalls not being told if there were problems, ulcers etc.    Repeat of chest along with Abd/pelvic CT on 7/20 revealed diffuse subcutaneous emphysema about the abdomen.  Chest CT that day with subq emphysema in region of chest, slightly increased left pleural effusion and LL opacity.   WBCs are normal.     Past Medical History  Diagnosis Date  . Hypertension   . Sarcoidosis   . OSA on CPAP   . Chronic respiratory failure     home oxygen PRN  . Bronchitis   . Pneumonia     Past Surgical History  Procedure Laterality Date  . Rotator cuff repair    . Arm surgery     . Video assisted thoracoscopy  (vats)/thorocotomy Left 07/17/2012    Procedure: VIDEO ASSISTED THORACOSCOPY (VATS)/BLEB Stapling;  Surgeon: Alleen Borne, MD;  Location: [facility] OR;  Service: Thoracic;  Laterality: Left;    Prior to Admission medications   Medication Sig Start Date End Date Taking? Authorizing Provider  amitriptyline (ELAVIL) 25 MG tablet Take 25 mg by mouth at bedtime. For sleep   Yes Historical Provider, MD  beclomethasone (QVAR) 80 MCG/ACT inhaler Inhale 2 puffs into the lungs 2 (two) times daily. 01/06/12  Yes Leslye Peer, MD  calcium-vitamin D (OSCAL WITH D) 500-200 MG-UNIT per tablet Take 1 tablet by mouth every morning.   Yes Historical Provider, MD  cholecalciferol (VITAMIN D) 1000 UNITS tablet Take 1,000 Units by mouth every morning.    Yes Historical Provider, MD  cyclobenzaprine (FLEXERIL) 10 MG tablet Take 10 mg by mouth 3 (three) times daily as needed. Muscle spasms.   Yes Historical Provider, MD  diphenhydrAMINE (BENADRYL) 25 MG tablet Take 25 mg by mouth every 6 (six) hours as needed. Allergies   Yes Historical Provider, MD  docusate sodium (COLACE) 100 MG capsule Take 1 capsule (100 mg total) by mouth every 12 (twelve) hours. 02/28/12  Yes Roxy Horseman, PA-C  famotidine (PEPCID) 20 MG tablet Take 1 tablet (20 mg total) by mouth 2 (two) times daily. 03/08/12  Yes Renae Fickle, MD  ferrous sulfate 325 (65 FE) MG tablet Take 1 tablet (325 mg  total) by mouth 2 (two) times daily with a meal. 06/18/12  Yes Leroy Sea, MD  fluticasone (FLONASE) 50 MCG/ACT nasal spray Place 2 sprays into the nose daily. 03/08/12  Yes Renae Fickle, MD  guaiFENesin (MUCINEX) 600 MG 12 hr tablet Take 1,200 mg by mouth 2 (two) times daily. For congestion.   Yes Historical Provider, MD  lidocaine (LIDODERM) 5 % Place 1 patch onto the skin daily. Remove & Discard patch within 12 hours or as directed by MD   Yes Historical Provider, MD  Multiple Vitamins-Minerals (MULTIVITAMIN WITH MINERALS) tablet Take 1 tablet by mouth  every morning.    Yes Historical Provider, MD  omeprazole (PRILOSEC) 40 MG capsule 40 mg. Take 1 capsule (40 mg total) by mouth 2 times daily. 11/02/11  Yes Historical Provider, MD  pregabalin (LYRICA) 200 MG capsule Take 200 mg by mouth 2 (two) times daily.   Yes Historical Provider, MD  promethazine (PHENERGAN) 25 MG tablet 25 mg. Take 1 tablet (25 mg total) by mouth every 6 (six) hours as needed for Nausea. 11/02/11  Yes Historical Provider, MD  sodium chloride (OCEAN) 0.65 % SOLN nasal spray Place 2 sprays into the nose 4 (four) times daily. 03/08/12  Yes Renae Fickle, MD  traZODone (DESYREL) 100 MG tablet Take 100 mg by mouth at bedtime. sleep   Yes Historical Provider, MD  bisacodyl (DULCOLAX) 5 MG EC tablet Take 2 tablets (10 mg total) by mouth daily as needed for constipation. 07/27/12   Bernadene Person, NP  levalbuterol Pauline Aus) 0.63 MG/3ML nebulizer solution Every 6 hours and every 3 hours as needed for SOB/wheezing 07/27/12   Bernadene Person, NP  metoprolol tartrate (LOPRESSOR) 50 MG tablet Take 1 tablet (50 mg total) by mouth 2 (two) times daily. 07/27/12   Bernadene Person, NP  oxyCODONE (OXY IR/ROXICODONE) 5 MG immediate release tablet Take 1 tablet (5 mg total) by mouth every 4 (four) hours as needed. 07/27/12   Bernadene Person, NP    Scheduled Meds: . amitriptyline  50 mg Oral QHS  . bisacodyl  10 mg Oral Daily  . budesonide (PULMICORT) nebulizer solution  0.25 mg Nebulization BID  . cholecalciferol  1,000 Units Oral q morning - 10a  . dextromethorphan-guaiFENesin  2 tablet Oral BID  . enoxaparin (LOVENOX) injection  40 mg Subcutaneous Q24H  . famotidine  20 mg Oral BID  . fentaNYL  50 mcg Transdermal Q72H  . fluticasone  2 spray Each Nare Daily  . gi cocktail  30 mL Oral BID  . levalbuterol  0.63 mg Nebulization Q6H  . levofloxacin  750 mg Oral Q breakfast  . metoprolol tartrate  50 mg Oral BID  . oxyCODONE-acetaminophen  2 tablet Oral Q6H  . pantoprazole   40 mg Oral BID  . polyethylene glycol  17 g Oral Daily  . predniSONE  40 mg Oral Q breakfast  . senna-docusate  2 tablet Oral BID   Infusions:   PRN Meds: sodium chloride, alum & mag hydroxide-simeth, baclofen, bisacodyl, calcium carbonate, diphenhydrAMINE, hydrALAZINE, ketorolac, levalbuterol, menthol-cetylpyridinium, metoprolol, ondansetron (ZOFRAN) IV, oxyCODONE, phenol, sodium chloride, sodium chloride   Allergies as of 07/10/2012 - Review Complete 07/10/2012  Allergen Reaction Noted  . Clindamycin/lincomycin Swelling 01/15/2011    Family History  Problem Relation Age of Onset  . Diabetes Father     History   Social History  . Marital Status: Divorced    Spouse Name: N/A    Number of Children: 2  .  Years of Education: N/A   Occupational History  . Disabled.    Social History Main Topics  . Smoking status: Never Smoker   . Smokeless tobacco: Never Used  . Alcohol Use: Yes     Comment: social  . Drug Use: No  . Sexually Active: No   Other Topics Concern  . Not on file   Social History Narrative   Divorced.  Lives alone.    REVIEW OF SYSTEMS: 12 system review completed.  See HPI for pertinent details  PHYSICAL EXAM: Vital signs in last 24 hours: Temp:  [98 F (36.7 C)-99.1 F (37.3 C)] 99 F (37.2 C) (07/24 1000) Pulse Rate:  [90-122] 105 (07/24 1000) Resp:  [18-22] 22 (07/24 1000) BP: (124-138)/(74-104) 125/83 mmHg (07/24 1000) SpO2:  [96 %-100 %] 99 % (07/24 1000) Weight:  [79.198 kg (174 lb 9.6 oz)] 79.198 kg (174 lb 9.6 oz) (07/23 2259)  General: slightly cushingoid, pleasant but unwell looking AAM Head:  No asymmetry.  +facial edema  Eyes:  No icterus or pallor Ears:  Not HOH  Nose:  No discharge Mouth:  Clear bil Neck:  No masses, no crepitus Lungs:  Greatly diminished, best heard at left base.  Congested vocal quality, dyspneic with speach Heart: RRR Abdomen:  Soft, no crepitus, no mass, no HSM.  Minor tenderness in epigastruim.  Active  BS.   Rectal: not done   Musc/Skeltl: some muscjular atropy in the extremities. Extremities:  Slight edema, not pitting, in legs  Neurologic:  Pleasant, fine tremor of head, arms.  No muscular weakness.  Skin:  No rash or sores.  No AVMs Tattoos:  none Nodes:  No inguinal or cervical adenopathy.    Psych:  Pleasant, engaged, not agitated but slightly anxious. .    Intake/Output from previous day: 07/23 0701 - 07/24 0700 In: 1440 [P.O.:720; I.V.:20; IV Piggyback:700] Out: 600 [Urine:600] Intake/Output this shift: Total I/O In: 290 [P.O.:240; IV Piggyback:50] Out: 380 [Urine:380]  LAB RESULTS:  Recent Labs  08/02/12 0330  WBC 7.4  HGB 9.3*  HCT 29.3*  PLT 387   BMET Lab Results  Component Value Date   NA 135 08/03/2012   NA 136 08/02/2012   NA 132* 07/30/2012   K 4.3 08/03/2012   K 4.6 08/02/2012   K 4.4 07/30/2012   CL 97 08/03/2012   CL 97 08/02/2012   CL 95* 07/30/2012   CO2 31 08/03/2012   CO2 30 08/02/2012   CO2 26 07/30/2012   GLUCOSE 113* 08/03/2012   GLUCOSE 97 08/02/2012   GLUCOSE 91 07/30/2012   BUN 7 08/03/2012   BUN 8 08/02/2012   BUN 12 07/30/2012   CREATININE 0.95 08/03/2012   CREATININE 1.04 08/02/2012   CREATININE 1.02 07/30/2012   CALCIUM 9.4 08/03/2012   CALCIUM 9.8 08/02/2012   CALCIUM 9.3 07/30/2012    RADIOLOGY STUDIES: CT CHEST/ ABDOMEN AND PELVIS 07/30/12 1. Increase in a small to moderate left pleural effusion since  most recent examination. Component along the anterior chest wall  has a lenticular configuration and may be loculated.  2. Perihilar opacities and traction bronchiectasis consistent with  history of sarcoidosis. Extent of opacity in the superior segment  left lower lobe appears somewhat increased since the 06/27/2012  study but not notably changed compared 02/14/12. This could be due  to increased atelectasis or pneumonia.  3. Negative for pneumothorax. Extensive subcutaneous air about  the chest noted. CT ABDOMEN AND  PELVIS 07/30/12 Findings: The gallbladder, liver, spleen,  adrenal glands, pancreas  and kidneys all appear normal. No lymphadenopathy or fluid is  identified. No free intraperitoneal air is seen. There is  extensive subcutaneous emphysema diffusely about the abdomen.  Visualized small and large bowel loops are unremarkable. No focal  bony abnormality.  IMPRESSION:  Diffuse subcutaneous emphysema about the abdomen. The abdomen is  otherwise negative.    Ct Aspiration 08/01/2012  IMPRESSION:  Successful CT guided aspiration of loculated pleural fluid within the anterior aspect of the left hemithorax.   Original Report Authenticated By: Tacey Ruiz, MD   Dg Chest [facility] 08/02/2012   .  IMPRESSION:  Stable chest x-ray findings.   Original Report Authenticated By: Rudie Meyer, M.D.    ENDOSCOPIC STUDIES: egd a few years ago at [facility].  Pt not aware of any pathlogic findings  IMPRESSION: *  Lower sternal and epigastric pain.  Hard to sort out what this is.  CT of chest and ab/pelvis unrevealing.  ? MS pain, ? Candida or other infectious esophagitis,  ? Pill esophagitis. ? Post surgical radiating pain.  *  Sarcoid.  ptx from ruptured bleb.  S/p VATS  PLAN: *  Per DR Leone Payor.  Not inclined to set up for EGD as sedation is risky for this pt.  *  ? trial of empiric Diflucan in case this is candidial    LOS: 24 days   Jennye Moccasin  08/03/2012, 10:23 AM Pager: [phone]     [facility] Attending  I have also seen and assessed the patient and agree with the above note. He believes he is improving. I think the subcutaneous abdominal wall emphysema is a likely cause of some of these pains in abdomen. LUQ and chest pain is post op/effusion, etc  I would not do any further investigation or change Tx. I reviewed CT images w/ patient and spouse.  Please call if ?'s   Iva Boop, MD, [facility] [phone] (pager) 08/03/2012 8:19 PM

## 2012-08-02 NOTE — Progress Notes (Signed)
PULMONARY  / CRITICAL CARE MEDICINE  Name: [name] MRN: [id number] DOB: 10-16-1965    ADMISSION DATE:  07/10/2012 CONSULTATION DATE:  07/10/12  REFERRING MD :  [name] PRIMARY SERVICE: PCCM  CHIEF COMPLAINT:  Acute resp failure, tension ptx in setting advanced sarcoidosis  BRIEF PATIENT DESCRIPTION: 47 y/o male with PMH of sarcoidosis, OSA, HTN, and home O2 use who came into [facility] with ARF r/t tension pneumothorax.  Patient was intubated and chest tube placed by [name]  SIGNIFICANT EVENTS / STUDIES:  6/17 ct chest: 10.1 x 7.8 cm thin-walled cavitary/bullous lesion in the posterior right upper lobe, unchanged. Confluent perihilar opacities with traction bronchiectasis and architectural distortion, related to known sarcoidosis. Associated peribronchovascular nodularity bilaterally. 6/30 -  tension PTX, resp failure, chest tube placed, intubated. 7/1 -  Extubated 7/7 - VATS ([facility]) - stapling of L apical bleb, mechanical pleurodesis 7/15 8 beat run of VT, asymptomatic 7/16 Chest tube removed 7/20> he is c/o incr pain epig & lower chest, sharp/ cut off his breath/ not relieved by pain meds or GI rx; plan CT chest & abd... 7/20 CT chest >> moderate L effusion with some apparent anterior loculation, some increase in L superior segmental opacity, no real change in chronic mediastinal LAD, R side opacities 7/20 CT abdomen >> subcutaneous air, otherwise negative  LINES / TUBES: 6/30 ETT >> 7/1 6/30 L chest tube >> 7/7 7/7 L chest tubes (Dr Laneta Simmers) >> 7/16 6/30 L subclavian CVC >> 7/15  CULTURES: 6/30 resp>>> abundant staph, abundant pseudomonas 6/30 BC >> NEG 7/13 Cdiff >>neg  ANTIBIOTICS: Ceftazidime 6/30>>>7/2 vanc 6/30>>>7/1 levaquin 7/2>>> d/ced Cefuroxime 7/7 >> post-op only  SUBJECTIVE:  Pt continues to have superior mid abd pain, L pleuritic pain. Has had several episodes of acute dyspnea that appear to correlate with poor pain control. S/p thoracentesis 7/22 with 90ml fluid off.   Not wanting to do therapy > hurts  VITAL SIGNS: Temp:  [97.6 F (36.4 C)-99.8 F (37.7 C)] 99.8 F (37.7 C) (07/23 0404) Pulse Rate:  [93-102] 102 (07/23 0404) Resp:  [22] 22 (07/23 0404) BP: (130-134)/(85-92) 130/87 mmHg (07/23 0404) SpO2:  [93 %-100 %] 100 % (07/23 0404) Weight:  [173 lb 4.8 oz (78.608 kg)] 173 lb 4.8 oz (78.608 kg) (07/22 2022)  Intake/Output Summary (Last 24 hours) at 08/02/12 0926 Last data filed at 08/01/12 1700  Gross per 24 hour  Intake    240 ml  Output      0 ml  Net    240 ml     PHYSICAL EXAMINATION: General: NAD, mild distress  Neuro: alert, oriented, moves all 4. Anxious when he has increase in L CP HEENT: WNL, voice hoarse, UA noise / stridor Cardiovascular: RRR s M Lungs: decreased BS but no overt wheezing, referred UA noise Abdomen: soft, NT, NABS Ext: warm, No edema, no cyanosis  LABS:  Recent Labs Lab 07/30/12 1422 08/02/12 0330  NA 132* 136  K 4.4 4.6  CL 95* 97  CO2 26 30  BUN 12 8  CREATININE 1.02 1.04  GLUCOSE 91 97    Recent Labs Lab 07/29/12 0430 08/02/12 0330  HGB 9.4* 9.3*  HCT 29.1* 29.3*  WBC 9.3 7.4  PLT 328 387    Recent Labs Lab 07/28/12 1105 07/29/12 0430 07/30/12 0605 08/01/12 0500 08/02/12 0330  PROCALCITON 0.40  --  0.43 1.04  --   WBC  --  9.3  --   --  7.4   No results found for  this basename: GLUCAP,  in the last 168 hours CXR:  Ct Aspiration  08/01/2012   *RADIOLOGY REPORT*  Indication: History of pulmonary sarcoidosis and recent tension pneumothorax, post lung resection and pleurodesis, now with indeterminate anteriorly located loculated left-sided pleural effusion.  CT GUIDED ASPIRATION OF LOCULATED LEFT SIDED PLEURAL EFFUSION  Comparison: Chest CT - 07/30/2012  Intravenous Medications: None  Contrast: None  Complications: None immediate  TECHNIQUE/FINDINGS:  Preprocedural ultrasound scanning failed to adequately delineate the known anteriorly loculated pleural fluid within the left  hemithorax, likely secondary to known overlying subcutaneous emphysema within the chest wall.  As such, the decision was made to perform the procedure under CT fluoroscopic guidance.   Informed consent was obtained from the patient following an explanation of the procedure, risks, benefits and alternatives.  A time out was performed prior to the initiation of the procedure.  The patient was positioned supine on the CT table and a limited CT was performed for procedural planning demonstrating grossly unchanged appearance of small to moderate sized loculated pleural effusion within the anterior aspect of the left hemithorax.  The procedure was planned.  The operative site was prepped and draped in the usual sterile fashion.   Appropriate trajectory was confirmed with a 22 gauge spinal needle after the adjacent tissues were anesthetized with 1% Lidocaine with epinephrine.  Under intermittent CT guidance, a 5-French, 7 cm Yueh sheath needle was advanced into the anterior loculated left-sided pleural fluid collection and approximately 90 ml of serous fluid was aspirated. The aspirated fluid was tapped and sent to the laboratory for analysis.  Limited postprocedural imaging demonstrates near resolution of anterior loculated pleural fluid without development of significant pneumothorax.  A dressing was placed.  The patient tolerated the procedure well without immediate postprocedural complication.  IMPRESSION:  Successful CT guided aspiration of loculated pleural fluid within the anterior aspect of the left hemithorax.   Original Report Authenticated By: Tacey Ruiz, MD   Dg Chest Port 1 View  08/02/2012   *RADIOLOGY REPORT*  Clinical Data: Respiratory failure.  PORTABLE CHEST - 1 VIEW  Comparison: 07/31/2012.  Findings: Persistent dense airspace process in the left lower lung which is a combination of parenchymal and pleural disease.  The right lung demonstrates stable large cystic airspace in the right upper lobe and  surrounding patchy infiltrates and nodules. Stable subcutaneous emphysema without obvious pneumothorax.  IMPRESSION:  Stable chest x-ray findings.   Original Report Authenticated By: Rudie Meyer, M.D.    ASSESSMENT / PLAN:  Acute on Chronic Respiratory failure ES fibrocavitary sarcoidosis, had been off daily steroids as of March 2014 (until this hosp) Left spont PTX c/b extensive subq emphysema  Post VATS, L apical bleb stapling/machanical pleurodesis 7/7 CAP vs purulent bronchitis, treated 6/30 - 7/7 OSA, usually on CPAP Post-op fever, L effusion with areas of loculation - s/p thoracentesis 7/22 with 90ml off.     P:   - Continue Duragesic 50 Q3d. - Continue toradol PRN and follow BMP to insure no change renal fxn; could be an issue with GERD. - Baclofen 10mg  Tid prn musc spasm at pt's request. - Cont supplemental O2. - Cont BDs, Flovent (as substitute for QVAR). - Cont Reflux max regimen > added pepcid to protonix 7/21. - f/u pleural fluid culture  - f/u CXR - no sig improvement after thora   HTN AF > NSR NSVT - one brief asymptomatic episode 7/15. No further W/U indicated for now P:  - Cont metoprolol. - Cont PRN  hydralazine.  Anemia   P:  - Monitor CBC.  Fever, ? Pleurisy but need to consider HCAP, empyema post-op P: - Follow cbc and PCT. - Continue abx.  Abd pain - CT abd ess negative  GERD  P:  -cont max GERD Rx  -GI consult, seems to have abd source of his pain which contributes to splinting/acute SOB -will schedule GI cocktail x 3 doses - ?if he is getting it PRN   WHITEHEART,KATHRYN, NP 08/02/2012  9:26 AM Pager: [phone] or [phone]  *Care during the described time interval was provided by me and/or other providers on the critical care team. I have reviewed this patient's available data, including medical history, events of note, physical examination and test results as part of my evaluation.  Levy Pupa, MD, PhD 08/02/2012, 3:36  PM [facility] [phone] or if no answer [phone]

## 2012-08-02 NOTE — Progress Notes (Signed)
ANTIBIOTIC CONSULT NOTE - Follow Up  Pharmacy Consult for cefepime and vancomycin Indication: pneumonia  Allergies  Allergen Reactions  . Clindamycin/Lincomycin Swelling   Patient Measurements: Height: 5' 7.25" (170.8 cm) Weight: 173 lb 4.8 oz (78.608 kg) IBW/kg (Calculated) : 66.68  Vital Signs: Temp: 99.8 F (37.7 C) (07/23 0404) Temp src: Oral (07/23 0404) BP: 130/87 mmHg (07/23 0404) Pulse Rate: 102 (07/23 0404) Intake/Output from previous day: 07/[name] 0701 - 07/23 0700 In: 240 [P.O.:240] Out: -  Intake/Output from this shift:   Labs:  Recent Labs  07/30/12 1422 08/02/12 0330  WBC  --  7.4  HGB  --  9.3*  PLT  --  387  CREATININE 1.02 1.04   Estimated Creatinine Clearance: 82.8 ml/min (by C-G formula based on Cr of 1.04).  Recent Labs  08/02/12 0330  VANCOTROUGH 19.0    Microbiology: Recent Results (from the past 720 hour(s))  MRSA PCR SCREENING     Status: None   Collection Time    07/10/12  5:00 PM      Result Value Range Status   MRSA by PCR NEGATIVE  NEGATIVE Final   Comment:            The GeneXpert MRSA Assay (FDA     approved for NASAL specimens     only), is one component of a     comprehensive MRSA colonization     surveillance program. It is not     intended to diagnose MRSA     infection nor to guide or     monitor treatment for     MRSA infections.  CULTURE, BLOOD (ROUTINE X 2)     Status: None   Collection Time    07/10/12  5:15 PM      Result Value Range Status   Specimen Description BLOOD LEFT HAND   Final   Special Requests BOTTLES DRAWN AEROBIC AND ANAEROBIC 10CC   Final   Culture  Setup Time 07/10/2012 [name]:03   Final   Culture NO GROWTH 5 DAYS   Final   Report Status 07/16/2012 FINAL   Final  CULTURE, BLOOD (ROUTINE X 2)     Status: None   Collection Time    07/10/12  5:30 PM      Result Value Range Status   Specimen Description BLOOD LEFT ARM   Final   Special Requests BOTTLES DRAWN AEROBIC AND ANAEROBIC 10CC   Final   Culture  Setup Time 07/10/2012 [name]:02   Final   Culture NO GROWTH 5 DAYS   Final   Report Status 07/16/2012 FINAL   Final  CULTURE, RESPIRATORY (NON-EXPECTORATED)     Status: None   Collection Time    07/10/12  6:44 PM      Result Value Range Status   Specimen Description TRACHEAL ASPIRATE   Final   Special Requests Immunocompromised   Final   Gram Stain     Final   Value: FEW WBC PRESENT, PREDOMINANTLY PMN     NO SQUAMOUS EPITHELIAL CELLS SEEN     FEW GRAM POSITIVE COCCI IN PAIRS   Culture     Final   Value: ABUNDANT STAPHYLOCOCCUS AUREUS     Note: RIFAMPIN AND GENTAMICIN SHOULD NOT BE USED AS SINGLE DRUGS FOR TREATMENT OF STAPH INFECTIONS.     ABUNDANT PSEUDOMONAS AERUGINOSA   Report Status 07/14/2012 FINAL   Final   Organism ID, Bacteria STAPHYLOCOCCUS AUREUS   Final   Organism ID, Bacteria PSEUDOMONAS AERUGINOSA  Final  CLOSTRIDIUM DIFFICILE BY PCR     Status: None   Collection Time    07/23/12  4:27 AM      Result Value Range Status   C difficile by pcr NEGATIVE  NEGATIVE Final   Medical History: Past Medical History  Diagnosis Date  . Hypertension   . Sarcoidosis   . OSA on CPAP   . Chronic respiratory failure     home oxygen PRN  . Bronchitis   . Pneumonia    Assessment: [name] with hx of advanced sarcoidosis admitted with tension pneumothorax. Previously completed antibiotics for pseudomonas in resp culture. Now had an episode of desaturation overnight and CXR shows L infiltrate. Renal function is stable. No new cultures.   Vancomycin trough is 19 mcg/ml and within desired goal range.    Goal of Therapy:  Vancomycin trough level 15-20 mcg/ml Eradication of infection  Plan:  1. Continue Cefepime 1g IV Q8h 2. Continue Vancomycin 1000mg  IV Q8hr 3. Vanc trough when clinically indicated 4. Follow up cultures and sensitivities as well as renal function  Nadara Mustard, PharmD., MS Clinical Pharmacist Pager:  [phone] Thank you for allowing pharmacy to be part  of this patients care team. 08/02/2012 4:54 AM

## 2012-08-02 NOTE — Progress Notes (Signed)
Attempted to see pt this morning, however, pt states that he has a new situation and that he needs to check with his nurse first before working with therapy. Pt's nurse encouraged pt to try to work with OT/PT. Pt then states that he needed to eat his cereal and requested therapy return later. Will re attempt later this morning  Lafe Garin, OT

## 2012-08-03 ENCOUNTER — Inpatient Hospital Stay (HOSPITAL_COMMUNITY): Payer: 59

## 2012-08-03 DIAGNOSIS — R1013 Epigastric pain: Secondary | ICD-10-CM

## 2012-08-03 DIAGNOSIS — J9600 Acute respiratory failure, unspecified whether with hypoxia or hypercapnia: Secondary | ICD-10-CM

## 2012-08-03 DIAGNOSIS — T797XXA Traumatic subcutaneous emphysema, initial encounter: Secondary | ICD-10-CM

## 2012-08-03 LAB — BASIC METABOLIC PANEL
CO2: 31 mEq/L (ref 19–32)
Calcium: 9.4 mg/dL (ref 8.4–10.5)
Potassium: 4.3 mEq/L (ref 3.5–5.1)
Sodium: 135 mEq/L (ref 135–145)

## 2012-08-03 MED ORDER — LEVOFLOXACIN 750 MG PO TABS
750.0000 mg | ORAL_TABLET | Freq: Every day | ORAL | Status: DC
  Administered 2012-08-03 – 2012-08-04 (×2): 750 mg via ORAL
  Filled 2012-08-03 (×4): qty 1

## 2012-08-03 NOTE — Progress Notes (Signed)
17 Days Post-Op Procedure(s) (LRB): VIDEO ASSISTED THORACOSCOPY (VATS)/BLEB Stapling (Left) Subjective: Left pleural effusion status post left VATS for bleb stapling Pleural fluid aspirate with no growth-fluid probably inflammatory Left upper quadrant pain probably  related to postthoracotomy pain Chest x-ray reviewed today appears satisfactory Objective: Vital signs in last 24 hours: Temp:  [98 F (36.7 C)-99.1 F (37.3 C)] 99 F (37.2 C) (07/24 1000) Pulse Rate:  [90-122] 105 (07/24 1000) Cardiac Rhythm:  [-] Normal sinus rhythm (07/23 1945) Resp:  [18-22] 22 (07/24 1000) BP: (124-138)/(74-104) 125/83 mmHg (07/24 1000) SpO2:  [96 %-100 %] 99 % (07/24 1000) Weight:  [174 lb 9.6 oz (79.198 kg)] 174 lb 9.6 oz (79.198 kg) (07/23 2259)  Hemodynamic parameters for last 24 hours:    Intake/Output from previous day: 07/23 0701 - 07/24 0700 In: 1440 [P.O.:720; I.V.:20; IV Piggyback:700] Out: 600 [Urine:600] Intake/Output this shift: Total I/O In: 290 [P.O.:240; IV Piggyback:50] Out: 380 [Urine:380]    Lab Results:  Recent Labs  08/02/12 0330  WBC 7.4  HGB 9.3*  HCT 29.3*  PLT 387   BMET:  Recent Labs  08/02/12 0330 08/03/12 0624  NA 136 135  K 4.6 4.3  CL 97 97  CO2 30 31  GLUCOSE 97 113*  BUN 8 7  CREATININE 1.04 0.95  CALCIUM 9.8 9.4    PT/INR: No results found for this basename: LABPROT, INR,  in the last 72 hours ABG    Component Value Date/Time   PHART 7.421 07/18/2012 0435   HCO3 32.8* 07/18/2012 0435   TCO2 34 07/18/2012 0435   ACIDBASEDEF 4.5* 07/11/2012 0406   O2SAT 98.0 07/18/2012 0435   CBG (last 3)  No results found for this basename: GLUCAP,  in the last 72 hours  Assessment/Plan: S/P Procedure(s) (LRB): VIDEO ASSISTED THORACOSCOPY (VATS)/BLEB Stapling (Left) No apparent thoracic surgical issues Will follow patient in office with chest x-ray near future   LOS: 24 days    VAN TRIGT III,PETER 08/03/2012

## 2012-08-03 NOTE — Progress Notes (Signed)
Pt cannot tolerate our CPAP machine so the machine has been removed from his room. Pt has been informed if he wants to try to use it again he can always let RT know. PT is currently on 2.5 lpm [place] with 100% o2 sats in no respiratory distress. RT will continue to assist as needed.

## 2012-08-03 NOTE — Progress Notes (Signed)
Occupational Therapy Treatment Patient Details Name: Kaidin Boehle MRN: [id number] DOB: 1965-05-11 Today's Date: 08/03/2012 Time: 1478-2956 OT Time Calculation (min): 41 min  OT Assessment / Plan / Recommendation  History of present illness Pt adm with tension pneumothorax due to advanced sarcoidosis.  Underwent VATS and BLEB stapling. s/p CT aspiration pleural effusion      OT comments  Pt continues to make excellent progress.  Follow Up Recommendations  No OT follow up       Equipment Recommendations  None recommended by OT       Frequency Min 2X/week   Progress towards OT Goals Progress towards OT goals: Progressing toward goals  Plan Discharge plan remains appropriate    Precautions / Restrictions Precautions Precautions: Fall Restrictions Weight Bearing Restrictions: No       ADL  Lower Body Dressing: Performed;Minimal assistance (for donning left sock--too painful, pt states he only wears ) Where Assessed - Lower Body Dressing: Unsupported sitting Toilet Transfer: Simulated;Modified independent Toilet Transfer Method: Sit to [facility]ista:  (bed>out door down hall>back to room) Equipment Used:  (W/C to hold O2(pt's choice), O2 at 2-3 liters prn as pt stat) Transfers/Ambulation Related to ADLs: Independent sit<>stand, Mod I with ambulation pushing W/C with O2 tank in it. He walked from his room to the nurses station to rest, then from there to one end of hall and back to the same place to sit and rest, then lastly to the other end of the hall (side his room is on) and back to his room ADL Comments: Pt uses purse lipped breathing appropriately prn and he is aware that it takes less energy to do a task sitting v. standing. He says he has a builit in shower seat and a hand held shower head at home and agrees that it will be better to use them than to try and stand to shower      OT Goals(current goals can now be found in the care plan section)    Visit  Information  Last OT Received On: 08/03/12 Assistance Needed: +1 History of Present Illness: Pt adm with tension pneumothorax due to advanced sarcoidosis.  Underwent VATS and BLEB stapling. s/p CT aspiration pleural effusion          Cognition  Cognition Arousal/Alertness: Awake/alert Behavior During Therapy: WFL for tasks assessed/performed Overall Cognitive Status: Within Functional Limits for tasks assessed    Mobility  Bed Mobility Details for Bed Mobility Assistance: pt sitting EOB upon entering room Transfers Transfers: Sit to Stand;Stand to Sit Sit to Stand: 7: Independent;With upper extremity assist;From bed Stand to Sit: 7: Independent;With upper extremity assist;To bed          End of Session OT - End of Session Equipment Utilized During Treatment: Oxygen (pushing W/C) Activity Tolerance: Patient tolerated treatment well Patient left:  (sitting EOB)       Evette Georges [phone] 08/03/2012, 4:39 PM

## 2012-08-03 NOTE — Progress Notes (Signed)
PULMONARY  / CRITICAL CARE MEDICINE  Name: [name] MRN: [id number] DOB: 1965/09/08    ADMISSION DATE:  07/10/2012 CONSULTATION DATE:  07/10/12  REFERRING MD :  [name] PRIMARY SERVICE: PCCM  CHIEF COMPLAINT:  Acute resp failure, tension ptx in setting advanced sarcoidosis  BRIEF PATIENT DESCRIPTION: 47 y/o male with PMH of sarcoidosis, OSA, HTN, and home O2 use who came into [facility] with ARF r/t tension pneumothorax.  Patient was intubated and chest tube placed by [name]  SIGNIFICANT EVENTS / STUDIES:  6/17 ct chest: 10.1 x 7.8 cm thin-walled cavitary/bullous lesion in the posterior right upper lobe, unchanged. Confluent perihilar opacities with traction bronchiectasis and architectural distortion, related to known sarcoidosis. Associated peribronchovascular nodularity bilaterally. 6/30 -  tension PTX, resp failure, chest tube placed, intubated. 7/1 -  Extubated 7/7 - VATS ([facility]) - stapling of L apical bleb, mechanical pleurodesis 7/15 8 beat run of VT, asymptomatic 7/16 Chest tube removed 7/20> he is c/o incr pain epig & lower chest, sharp/ cut off his breath/ not relieved by pain meds or GI rx; plan CT chest & abd... 7/20 CT chest >> moderate L effusion with some apparent anterior loculation, some increase in L superior segmental opacity, no real change in chronic mediastinal LAD, R side opacities 7/20 CT abdomen >> subcutaneous air, otherwise negative  LINES / TUBES: 6/30 ETT >> 7/1 6/30 L chest tube >> 7/7 7/7 L chest tubes (Dr Laneta Simmers) >> 7/16 6/30 L subclavian CVC >> 7/15  CULTURES: 6/30 resp>>> abundant staph, abundant pseudomonas 6/30 BC >> NEG 7/13 Cdiff >>neg  ANTIBIOTICS: Ceftazidime 6/30>>>7/2 vanc 6/30>>>7/1 levaquin 7/2>>> d/ced Cefuroxime 7/7 >> post-op only vanc 7/21>>>7/24 Cefepime 7/21>>>7/24 Levaquin 7/24>>>  SUBJECTIVE:  Pain a little better   VITAL SIGNS: Temp:  [98 F (36.7 C)-99.1 F (37.3 C)] 98.6 F (37 C) (07/24 0604) Pulse Rate:  [90-122] 109  (07/24 0604) Resp:  [18-22] 21 (07/24 0604) BP: (117-138)/(74-104) 134/87 mmHg (07/24 0604) SpO2:  [96 %-100 %] 98 % (07/24 0604) Weight:  [79.198 kg (174 lb 9.6 oz)] 79.198 kg (174 lb 9.6 oz) (07/23 2259)  Intake/Output Summary (Last 24 hours) at 08/03/12 0903 Last data filed at 08/03/12 0800  Gross per 24 hour  Intake   1370 ml  Output    600 ml  Net    770 ml     PHYSICAL EXAMINATION: General: NAD, mild distress  Neuro: alert, oriented, moves all 4. Anxious when he has increase in L CP HEENT: WNL, voice hoarse, UA noise / stridor Cardiovascular: RRR s M Lungs: decreased BS but no overt wheezing, referred UA noise Abdomen: soft, NT, NABS Ext: warm, No edema, no cyanosis  LABS:  Recent Labs Lab 07/30/12 1422 08/02/12 0330 08/03/12 0624  NA 132* 136 135  K 4.4 4.6 4.3  CL 95* 97 97  CO2 26 30 31   BUN 12 8 7   CREATININE 1.02 1.04 0.95  GLUCOSE 91 97 113*    Recent Labs Lab 07/29/12 0430 08/02/12 0330  HGB 9.4* 9.3*  HCT 29.1* 29.3*  WBC 9.3 7.4  PLT 328 387    Recent Labs Lab 07/28/12 1105 07/29/12 0430 07/30/12 0605 08/01/12 0500 08/02/12 0330  PROCALCITON 0.40  --  0.43 1.04  --   WBC  --  9.3  --   --  7.4   No results found for this basename: GLUCAP,  in the last 168 hours CXR:  Ct Aspiration  08/01/2012   *RADIOLOGY REPORT*  Indication: History of pulmonary  sarcoidosis and recent tension pneumothorax, post lung resection and pleurodesis, now with indeterminate anteriorly located loculated left-sided pleural effusion.  CT GUIDED ASPIRATION OF LOCULATED LEFT SIDED PLEURAL EFFUSION  Comparison: Chest CT - 07/30/2012  Intravenous Medications: None  Contrast: None  Complications: None immediate  TECHNIQUE/FINDINGS:  Preprocedural ultrasound scanning failed to adequately delineate the known anteriorly loculated pleural fluid within the left hemithorax, likely secondary to known overlying subcutaneous emphysema within the chest wall.  As such, the decision  was made to perform the procedure under CT fluoroscopic guidance.   Informed consent was obtained from the patient following an explanation of the procedure, risks, benefits and alternatives.  A time out was performed prior to the initiation of the procedure.  The patient was positioned supine on the CT table and a limited CT was performed for procedural planning demonstrating grossly unchanged appearance of small to moderate sized loculated pleural effusion within the anterior aspect of the left hemithorax.  The procedure was planned.  The operative site was prepped and draped in the usual sterile fashion.   Appropriate trajectory was confirmed with a 22 gauge spinal needle after the adjacent tissues were anesthetized with 1% Lidocaine with epinephrine.  Under intermittent CT guidance, a 5-French, 7 cm Yueh sheath needle was advanced into the anterior loculated left-sided pleural fluid collection and approximately 90 ml of serous fluid was aspirated. The aspirated fluid was tapped and sent to the laboratory for analysis.  Limited postprocedural imaging demonstrates near resolution of anterior loculated pleural fluid without development of significant pneumothorax.  A dressing was placed.  The patient tolerated the procedure well without immediate postprocedural complication.  IMPRESSION:  Successful CT guided aspiration of loculated pleural fluid within the anterior aspect of the left hemithorax.   Original Report Authenticated By: Tacey Ruiz, MD   Dg Chest Portable 1 View  08/03/2012   *RADIOLOGY REPORT*  Clinical Data: Shortness of breath, follow up of effusion  PORTABLE CHEST - 1 VIEW  Comparison: Portable chest x-ray of 08/02/2012  Findings: The lungs are not quite as well aerated.  Bibasilar opacities left greater than right persist. There do appear to be pleural effusions present left larger than right. A small amount of right chest wall subcutaneous air is noted, with some subcutaneous air in the left  neck as well.  No definite pneumothorax is seen.  IMPRESSION: Persistent bibasilar opacities left greater than right.  No definite pneumothorax.   Original Report Authenticated By: Dwyane Dee, M.D.   Dg Chest Port 1 View  08/02/2012   *RADIOLOGY REPORT*  Clinical Data: Respiratory failure.  PORTABLE CHEST - 1 VIEW  Comparison: 07/31/2012.  Findings: Persistent dense airspace process in the left lower lung which is a combination of parenchymal and pleural disease.  The right lung demonstrates stable large cystic airspace in the right upper lobe and surrounding patchy infiltrates and nodules. Stable subcutaneous emphysema without obvious pneumothorax.  IMPRESSION:  Stable chest x-ray findings.   Original Report Authenticated By: Rudie Meyer, M.D.  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   He for the first time  looks like he's gaining some ground on 7/24.  P:   - Continue Duragesic 50 Q3d. - Continue toradol PRN and follow BMP to insure no change renal fxn; could be an issue with GERD. - Baclofen 10mg  Tid prn musc spasm at pt's request. - Cont supplemental O2. - Cont BDs, Flovent (as substitute for QVAR). - Cont Reflux max regimen > added pepcid to protonix 7/21. - f/u pleural fluid culture  - pred started 7/24 - will narrow abx 7/24  HTN AF > NSR NSVT - one brief asymptomatic episode 7/15. No further W/U indicated for now P:  - Cont metoprolol. - Cont PRN hydralazine.  Anemia   P:  - Monitor CBC.   Abd pain - CT abd  negative  GERD  He says pain a little better  as of 7/24. He describes the abd pain more Left upper quad... Wonder if this could be referred pain from PNA?  P:  -cont max GERD Rx  -GI consulted, seems to have abd source of his pain which contributes to splinting/acute SOB    Will see how he does w/ narrowing abx. If he looks good in am will d/c 7/25 to home.   Murrell Converse, MD, PhD 08/04/2012, 10:59 AM [facility] [phone] or if no answer [phone]

## 2012-08-04 ENCOUNTER — Inpatient Hospital Stay: Payer: 59 | Admitting: Adult Health

## 2012-08-04 LAB — CBC
Hemoglobin: 7.7 g/dL — ABNORMAL LOW (ref 13.0–17.0)
MCV: 80.5 fL (ref 78.0–100.0)
Platelets: 433 10*3/uL — ABNORMAL HIGH (ref 150–400)
RBC: 3.02 MIL/uL — ABNORMAL LOW (ref 4.22–5.81)
WBC: 6.1 10*3/uL (ref 4.0–10.5)

## 2012-08-04 LAB — BODY FLUID CULTURE: Culture: NO GROWTH

## 2012-08-04 MED ORDER — LEVOFLOXACIN 750 MG PO TABS: 750.0000 mg | ORAL_TABLET | Freq: Every day | ORAL | Status: DC

## 2012-08-04 MED ORDER — PREDNISONE 10 MG PO TABS: ORAL_TABLET | ORAL | Status: DC

## 2012-08-04 MED ORDER — BUDESONIDE 0.25 MG/2ML IN SUSP: 0.2500 mg | Freq: Two times a day (BID) | RESPIRATORY_TRACT | Status: DC

## 2012-08-04 MED ORDER — OXYCODONE-ACETAMINOPHEN 5-325 MG PO TABS: 2.0000 | ORAL_TABLET | Freq: Four times a day (QID) | ORAL | Status: DC

## 2012-08-04 NOTE — Progress Notes (Signed)
Pt. Got d/c orders and fallow up appointments.IV was d/c,tele was d/c,pt. Ready to go home.

## 2012-08-04 NOTE — Care Management Note (Signed)
   CARE MANAGEMENT NOTE 08/04/2012  Patient:  [name],[name]   Account Number:  [id number]  Date Initiated:  08/04/2012  Documentation initiated by:  Johny Shock  Subjective/Objective Assessment:   Order for home oxygen     Action/Plan:   08/04/2012 Pt has home oxygen and family is bringing tank for d/c to home.   Anticipated DC Date:  08/04/2012   Anticipated DC Plan:  HOME/SELF CARE         Choice offered to / List presented to:             Status of service:  Completed, signed off Medicare Important Message given?   (If response is "NO", the following Medicare IM given date fields will be blank) Date Medicare IM given:   Date Additional Medicare IM given:    Discharge Disposition:  HOME/SELF CARE  Per UR Regulation:    If discussed at Long Length of Stay Meetings, dates discussed:    Comments:  08/04/2012 Pt asking for assistance with motorized transportation. Pt given names of local medical equipment providers and this CM explained that hospital CM is unable to assist with this DME. Pt states that he will followup with DME providers after d/c. Johny Shock RN MPH Case Manager [phone]

## 2012-08-08 ENCOUNTER — Other Ambulatory Visit: Payer: Self-pay | Admitting: *Deleted

## 2012-08-09 ENCOUNTER — Ambulatory Visit (INDEPENDENT_AMBULATORY_CARE_PROVIDER_SITE_OTHER): Payer: 59 | Admitting: Surgery

## 2012-08-09 ENCOUNTER — Ambulatory Visit
Admission: RE | Admit: 2012-08-09 | Discharge: 2012-08-09 | Disposition: A | Discharge status: Home / Self Care | Payer: 59 | Source: Ambulatory Visit | Attending: Surgery | Admitting: Surgery

## 2012-08-09 ENCOUNTER — Encounter: Payer: Self-pay | Admitting: Surgery

## 2012-08-09 VITALS — BP 178/103 | HR 70 | Resp 20 | Ht 67.0 in | Wt 176.0 lb

## 2012-08-09 DIAGNOSIS — D869 Sarcoidosis, unspecified: Secondary | ICD-10-CM

## 2012-08-09 DIAGNOSIS — J9382 Other air leak: Secondary | ICD-10-CM

## 2012-08-09 DIAGNOSIS — J9383 Other pneumothorax: Secondary | ICD-10-CM

## 2012-08-09 DIAGNOSIS — Z09 Encounter for follow-up examination after completed treatment for conditions other than malignant neoplasm: Secondary | ICD-10-CM

## 2012-08-09 DIAGNOSIS — J939 Pneumothorax, unspecified: Secondary | ICD-10-CM

## 2012-08-10 ENCOUNTER — Other Ambulatory Visit (INDEPENDENT_AMBULATORY_CARE_PROVIDER_SITE_OTHER): Payer: 59

## 2012-08-10 ENCOUNTER — Ambulatory Visit (INDEPENDENT_AMBULATORY_CARE_PROVIDER_SITE_OTHER): Payer: 59 | Admitting: Adult Health

## 2012-08-10 ENCOUNTER — Encounter: Payer: Self-pay | Admitting: Adult Health

## 2012-08-10 VITALS — BP 144/72 | HR 94 | Temp 98.7°F | Ht 67.0 in | Wt 177.0 lb

## 2012-08-10 DIAGNOSIS — J189 Pneumonia, unspecified organism: Secondary | ICD-10-CM

## 2012-08-10 DIAGNOSIS — I10 Essential (primary) hypertension: Secondary | ICD-10-CM

## 2012-08-10 DIAGNOSIS — J9383 Other pneumothorax: Secondary | ICD-10-CM

## 2012-08-10 DIAGNOSIS — D869 Sarcoidosis, unspecified: Secondary | ICD-10-CM

## 2012-08-10 DIAGNOSIS — J471 Bronchiectasis with (acute) exacerbation: Secondary | ICD-10-CM

## 2012-08-10 DIAGNOSIS — J939 Pneumothorax, unspecified: Secondary | ICD-10-CM

## 2012-08-10 LAB — CBC WITH DIFFERENTIAL/PLATELET
Basophils Absolute: 0 10*3/uL (ref 0.0–0.1)
Eosinophils Absolute: 0 10*3/uL (ref 0.0–0.7)
Eosinophils Relative: 0.2 % (ref 0.0–5.0)
MCV: 80.4 fl (ref 78.0–100.0)
Monocytes Absolute: 0.5 10*3/uL (ref 0.1–1.0)
Neutrophils Relative %: 85.9 % — ABNORMAL HIGH (ref 43.0–77.0)
Platelets: 740 10*3/uL — ABNORMAL HIGH (ref 150.0–400.0)
RDW: 15.5 % — ABNORMAL HIGH (ref 11.5–14.6)
WBC: 9.9 10*3/uL (ref 4.5–10.5)

## 2012-08-10 LAB — BASIC METABOLIC PANEL
Calcium: 9.6 mg/dL (ref 8.4–10.5)
GFR: 111.62 mL/min (ref >60.00)
Glucose, Bld: 96 mg/dL (ref 70–99)
Potassium: 4.5 mEq/L (ref 3.5–5.1)
Sodium: 140 mEq/L (ref 135–145)

## 2012-08-10 MED ORDER — HYDROCODONE-ACETAMINOPHEN 5-325 MG PO TABS: 1.0000 | ORAL_TABLET | Freq: Four times a day (QID) | ORAL | Status: DC | PRN

## 2012-08-10 NOTE — Progress Notes (Signed)
Subjective:    Patient ID: [name], male    DOB: December 05, 1965, 47 y.o.   MRN: [id number]  HPI 47 yo man, never smoker, hx sarcoidosis dx by transbronchial bx's in 2002 and again in 2011 or 2012, also OSA on CPAP. He has occasional flares that sound asthmatic in nature, gets treated with prednisone. He flares typically a few times a year, but he has also gone for over a year without an exacerbation. He has never been on everyday meds, has been on pred with exacerbations - longest he was on it was 90 days. He has also been on a steroid-sparing agent before (?MTX), but it was stopped. He has a large cavity in R lung, scattered infiltrates. He has been on Advair before, Spiriva before, not currently. Has albuterol available prn, uses almost every day. Last PFT were last year at [facility]   ROV 01/06/12 -- sarcoidosis, R lung cavitary disease. Was recently hospitalized for an acute exacerbation. D/c after pred taper. Last CT scan was 4/13. He is using nebs 2 -3 x a day.   04/06/12  Post Hospital follow up  Patient presents for a post hospital followup. Patient was recently admitted with acute bronchiectasis, exacerbation, complicated by underlying sarcoidosis. Patient has had several recent hospitalizations and emergency room visits.   He was treated with aggressive pulmonary hygiene, antibiotics, and a steroid taper. Since discharge pt reports breathing is doing well overall but still wheezing, SOB, head congestion and prod cough with clear  Mucus .  Patient denies any hemoptysis, orthopnea, PND, or leg swelling. During admission. Patient has had cocaine, positive urine drug screen in his past. He did receive a social work consult. Patient denies any drug use at today's visit.  He was discharged on prednisone to recommend to hold at 20 mg however, patient misunderstood instructions and has stopped all steroids.  ROV 05/18/12 -- sarcoidosis, bronchiectasis and R cavitary disease. Regular f/u visit. Has  been doing well, some intermittent SOB. Taking allegra and benadryl. He is on QVAR daily, off prednisone. Uses SABA rarely. He is reliable w CPAP.   ROV 06/27/12 -- sarcoidosis, bronchiectasis and R cavitary disease. Also OSA, HTN w diastolic dysfxn.  Was admitted for flare obstructive lung disease/sarcoidosis 6/6 - 6/8. He underwent repeat CT scan chest today >> stable bullous and parenchymal changes compared with 02/2012. He feels back to baseline clinically. Having some cough, clear phlegm. On flonase, allegra and benadryl.    08/10/2012 post hospital followup. Patient returns for a post hospital followup. Patient had a complicated hospital course, which he was admitted June 30, and discharged on 08/04/2012. Patient was admitted with acute respiratory failure with a left tension pneumothorax requiring mechanical ventilation. Patient had a persistent left pneumothorax with subcutaneous emphysema around his chest tube. He was seen by thoracic surgery and underwent a left video-assisted thoracoscopy on July 7 with stapling of apical blebs and mechanical thoracentesis. Pt cont w/ dyspnea/fevers.  CT scan on July 20 showed a, moderate left pleural effusion with loculation anteriorly/? HCAP >Underwent thoracentesis was done with 90 cc removed. Analysis was consistent with an exudative effusion and his culture data was negative. He was treated with abx and discharged on Levaquin to complete a ten-day course. And steroid taper-which he has a  few days left.  He was seen by thoracic surgery yesterday.chest x-ray showed a mild right apical pneumothorax. That is unchanged from previous exam.   Since discharge he is feeling better but still very weak.  Feels  breathing is doing well overall.  does report some pain at the incision site and in the left lung.  finished levaquin yesterday, still taking prednisone at 3tabs daily. No hemoptysis , orthopnea or edema.     Objective:   Physical Exam   Gen: Pleasant,  well-nourished, in no distress,  normal affect  ENT: No lesions,  mouth clear,  oropharynx clear, no postnasal drip  Neck: No JVD, no TMG, no carotid bruits  Lungs:  Diminshed BS in bases   Cardiovascular: RRR, heart sounds normal, no murmur or gallops, no peripheral edema  Musculoskeletal: No deformities, no cyanosis or clubbing  Neuro: alert, non focal  Skin: Warm, no lesions or rashes  CXR 08/09/12 Mild right apical pneumothorax is noted which is unchanged compared . Stable bilateral lung opacities compared to prior exam consistent with history of sarcoidosis.   CT chest 07/30/12 >Increase in a small to moderate left pleural effusion since  most recent examination. Component along the anterior chest wall  has a lenticular configuration and may be loculated.  2. Perihilar opacities and traction bronchiectasis consistent with  history of sarcoidosis. Extent of opacity in the superior segment  left lower lobe appears somewhat increased since the 06/27/2012  study but not notably changed compared 02/14/12. This could be due  to increased atelectasis or pneumonia.  3. Negative for pneumothorax. Extensive subcutaneous air about  the chest noted.  CT chest 06/27/12 --  Comparison: CTA chest dated 02/14/2012  Findings: 10.1 x 7.8 cm thin-walled cavitary/bullous lesion in the  posterior right upper lobe (series 4/image 20), unchanged.  Confluent perihilar opacities with traction bronchiectasis and  architectural distortion, related to known sarcoidosis. Associated  peribronchovascular nodularity bilaterally.  On inspiratory / expiratory imaging, there is no evidence of air  trapping.  No superimposed opacities suspicious for pneumonia. No pleural  effusion or pneumothorax.  Heart is normal in size. No pericardial effusion.  Partially calcified mediastinal lymphadenopathy, including:  --1.3 cm short-axis right paratracheal node (series 2/image 17)  --1.0 cm short-axis AP window node  (series 2/image 18)  --1.9 cm short-axis subcarinal node (series 2/image 27)  Associated hilar lymphadenopathy is suspected but difficult to  discretely measure in the absence of intravenous contrast  administration. The overall appearance is unchanged.  Visualized upper abdomen is unremarkable.  Visualized osseous structures are within normal limits.       Assessment & Plan:

## 2012-08-10 NOTE — Patient Instructions (Addendum)
I will call with lab results.  Taper off prednisone as directed.  Continue on current regimen.  May use Vicodin 1 every 6hr As needed  Pain , may make you sleepy.  follow up Dr. Delton Coombes  In 4 weeks and As needed   Please contact office for sooner follow up if symptoms do not improve or worsen or seek emergency care

## 2012-08-13 ENCOUNTER — Encounter: Payer: Self-pay | Admitting: Surgery

## 2012-08-13 NOTE — Progress Notes (Signed)
[address]       [name] [address]             [phone]        HPI: Patient returns for routine postoperative follow-up having undergone left VATS with stapling of an apical bleb and mechanical pleurodesis on 07/17/2012. The patient's early postoperative recovery while in the hospital was notable for a slow postop course with a small persistent air leak that eventually stopped. He was treated for some bronchitis or pneumonia. Since hospital discharge the patient reports that he has been improving. He has mild chest wall discomfort.   Current Outpatient Prescriptions  Medication Sig Dispense Refill  . bisacodyl (DULCOLAX) 5 MG EC tablet Take 2 tablets (10 mg total) by mouth daily as needed for constipation.  30 tablet  0  . budesonide (PULMICORT) 0.25 MG/2ML nebulizer solution Take 2 mLs (0.25 mg total) by nebulization 2 (two) times daily.  60 mL  12  . cholecalciferol (VITAMIN D) 1000 UNITS tablet Take 1,000 Units by mouth every morning.       . cyclobenzaprine (FLEXERIL) 10 MG tablet Take 10 mg by mouth 3 (three) times daily as needed. Muscle spasms.      . diphenhydrAMINE (BENADRYL) 25 MG tablet Take 25 mg by mouth every 6 (six) hours as needed. Allergies      . docusate sodium (COLACE) 100 MG capsule Take 1 capsule (100 mg total) by mouth every 12 (twelve) hours.  60 capsule  0  . famotidine (PEPCID) 20 MG tablet Take 1 tablet (20 mg total) by mouth 2 (two) times daily.  60 tablet  1  . ferrous sulfate 325 (65 FE) MG tablet Take 1 tablet (325 mg total) by mouth 2 (two) times daily with a meal.  60 tablet  0  . fluticasone (FLONASE) 50 MCG/ACT nasal spray Place 2 sprays into the nose daily.  16 g  1  . guaiFENesin (MUCINEX) 600 MG 12 hr tablet Take 1,200 mg by mouth 2 (two) times daily. For congestion.      Marland Kitchen levalbuterol (XOPENEX) 0.63 MG/3ML nebulizer solution Every 6 hours and every 3 hours as needed for SOB/wheezing  240 mL  2  . lidocaine (LIDODERM) 5 %  Place 1 patch onto the skin daily. Remove & Discard patch within 12 hours or as directed by MD      . metoprolol tartrate (LOPRESSOR) 50 MG tablet Take 1 tablet (50 mg total) by mouth 2 (two) times daily.  30 tablet  0  . Multiple Vitamins-Minerals (MULTIVITAMIN WITH MINERALS) tablet Take 1 tablet by mouth every morning.       Marland Kitchen omeprazole (PRILOSEC) 40 MG capsule Take 1 capsule (40 mg total) by mouth 2 times daily.      Marland Kitchen oxyCODONE-acetaminophen (PERCOCET/ROXICET) 5-325 MG per tablet Take 2 tablets by mouth every 6 (six) hours.  30 tablet  0  . predniSONE (DELTASONE) 10 MG tablet Take 4 tabs  daily with food x 4 days, then 3 tabs daily x 4 days, then 2 tabs daily x 4 days, then 1 tab daily and hold at this dose (or as instructed otherwise)  60 tablet  2  . promethazine (PHENERGAN) 25 MG tablet Take 1 tablet (25 mg total) by mouth every 6 (six) hours as needed for Nausea.      . sodium chloride (OCEAN) 0.65 % SOLN nasal spray Place 2 sprays into the nose 4 (four) times daily.      Marland Kitchen  albuterol (PROVENTIL) (2.5 MG/3ML) 0.083% nebulizer solution 1 vial in nebulizer every 4 hours as needed      . calcium-vitamin D (OSCAL WITH D) 500-200 MG-UNIT per tablet Take 1 tablet by mouth every morning.      Marland Kitchen HYDROcodone-acetaminophen (NORCO/VICODIN) 5-325 MG per tablet Take 1 tablet by mouth every 6 (six) hours as needed for pain.  30 tablet  0  . pregabalin (LYRICA) 200 MG capsule Take 200 mg by mouth 2 (two) times daily.       No current facility-administered medications for this visit.    Physical Exam: BP 178/103  Pulse 70  Resp 20  Ht 5\' 7"  (1.702 m)  Wt 176 lb (79.833 kg)  BMI 27.56 kg/m2  SpO2 98% He looks well Lung exam is fairly clear The incisions are healing well  Diagnostic Tests:   *RADIOLOGY REPORT*   Clinical Data: Sarcoidosis   CHEST - 2 VIEW   Comparison: August 03, 2012.   Findings: Cardiomediastinal silhouette appears normal. Left lateral pleural thickening is noted which  was present on prior exam. Persistent irregular opacities are noted in both lungs, with left greater than right, and these are unchanged compared to prior exam. This most likely represents scarring related to history of sarcoidosis.  Mild right apical pneumothorax is noted which is unchanged compared to prior exam.   IMPRESSION: Mild right apical pneumothorax is noted which is unchanged compared to prior exam. Stable bilateral lung opacities compared to prior exam consistent with history of sarcoidosis.     Original Report Authenticated By: Lupita Raider.,  M.D.   Impression:  He is making a good recovery following his surgery. He has severe pulmonary sarcoid but I performed an extensive pleurodesis so hopefully he won't get another pneumothorax on the left. 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  Plan:  He will continue to follow-up with Dr. Delton Coombes and I will be happy to see him back if needed.

## 2012-08-14 NOTE — Assessment & Plan Note (Signed)
S/p VATS  cxr yesterday stable  Cont follow up as planned with TS

## 2012-08-14 NOTE — Assessment & Plan Note (Signed)
Recent exacerbation during critical illness w/ Tension Pneumothorax requiring VATS and associated HCAP/parapneumonic efffusion   Plan  Taper off prednisone as directed.  Continue on current regimen.  May use Vicodin 1 every 6hr As needed  Pain , may make you sleepy.  follow up Dr. Delton Coombes  In 4 weeks and As needed   Please contact office for sooner follow up if symptoms do not improve or worsen or seek emergency care

## 2012-08-14 NOTE — Assessment & Plan Note (Signed)
HCAP during critical care admission for tension Pneumothorax complicated by severe Sarcoid  cxr improved after abx  Cont on current regimen.

## 2012-08-25 IMAGING — CR DG CHEST 2V
2 series · 2 of 2 positions shown · non-contrast
Comparison: 04/27/2011; 01/15/2011; 10/21/2010; chest CT -
04/12/2011

CLINICAL DATA: Shortness of breath, history of sarcoidosis

CHEST - 2 VIEW

[w chest pa]
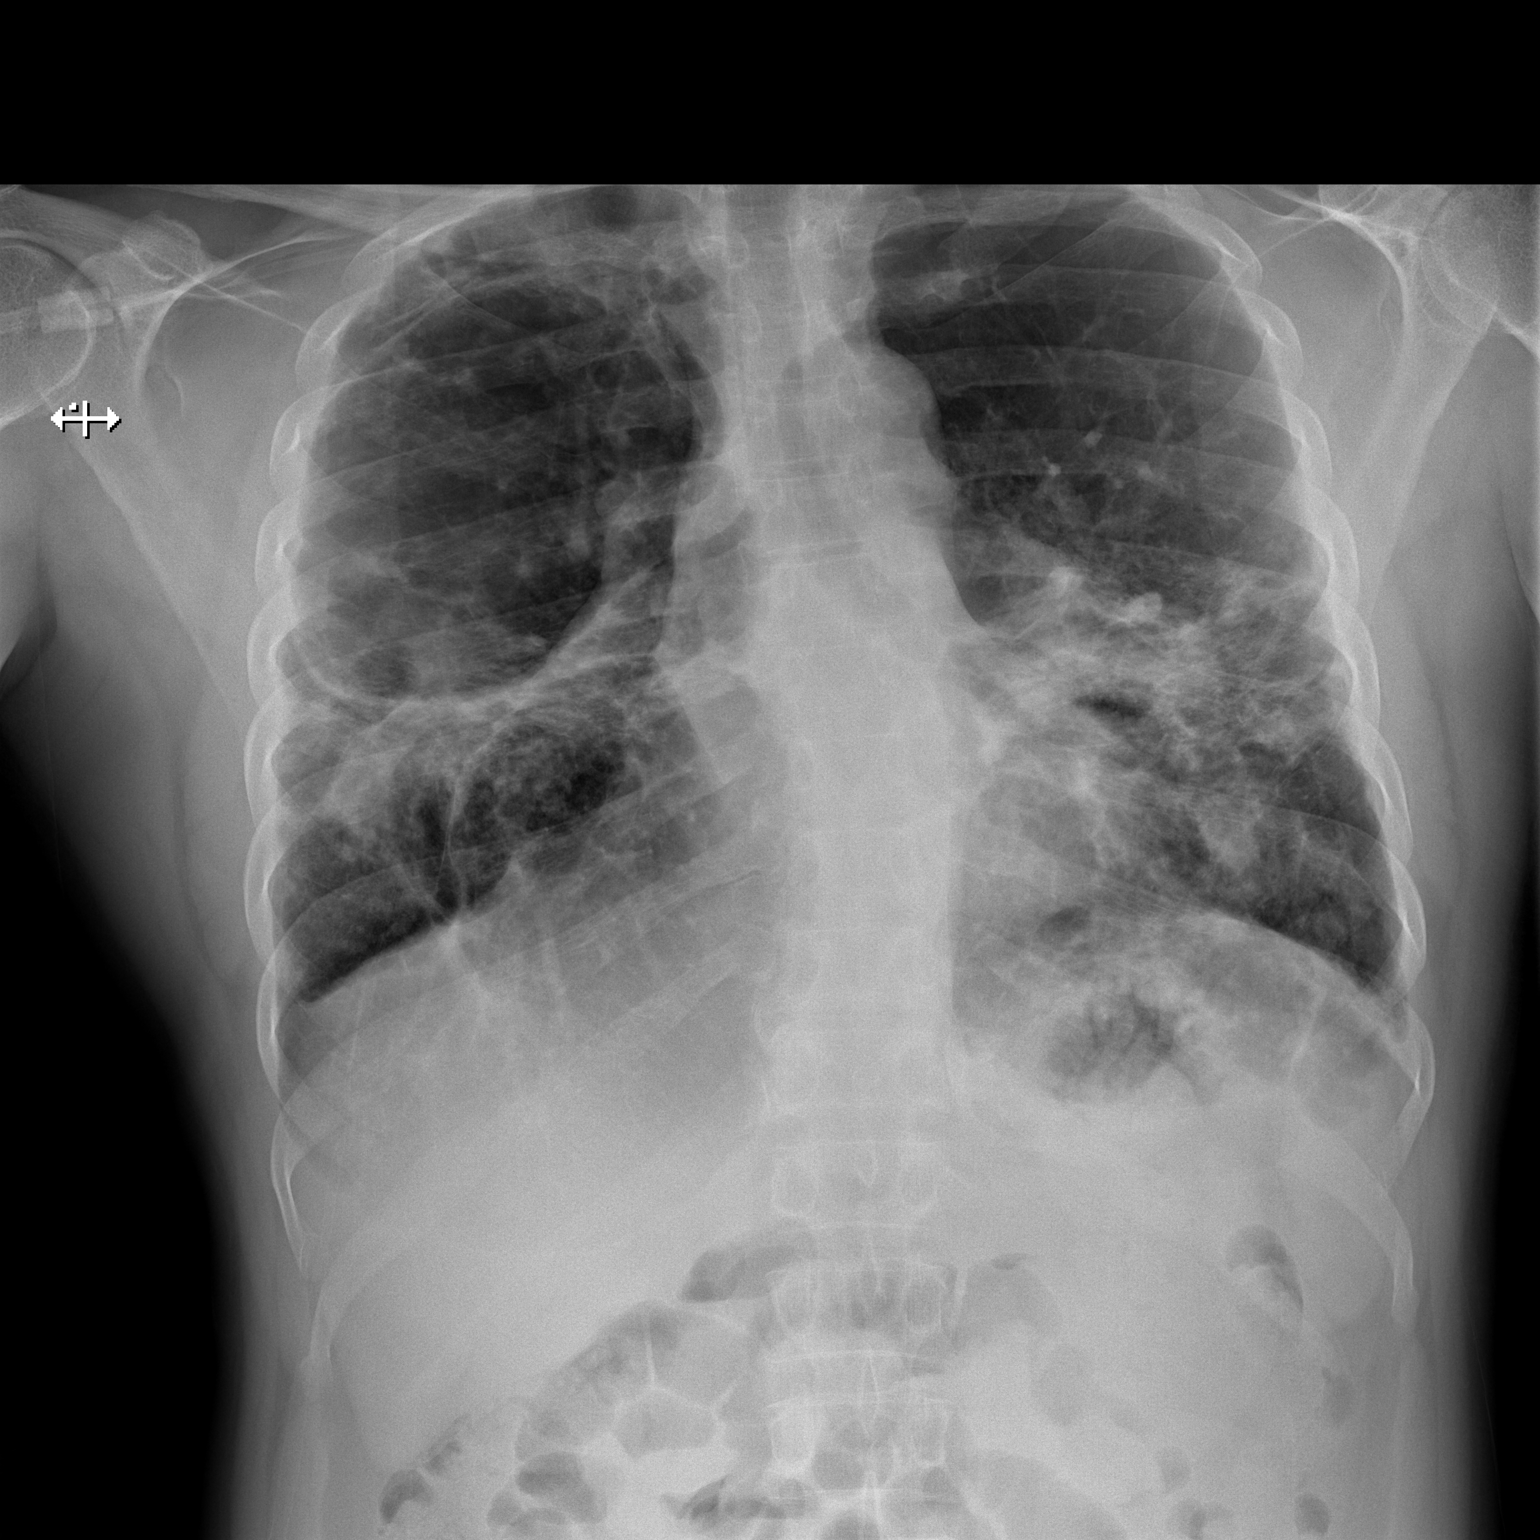

[w chest lat]
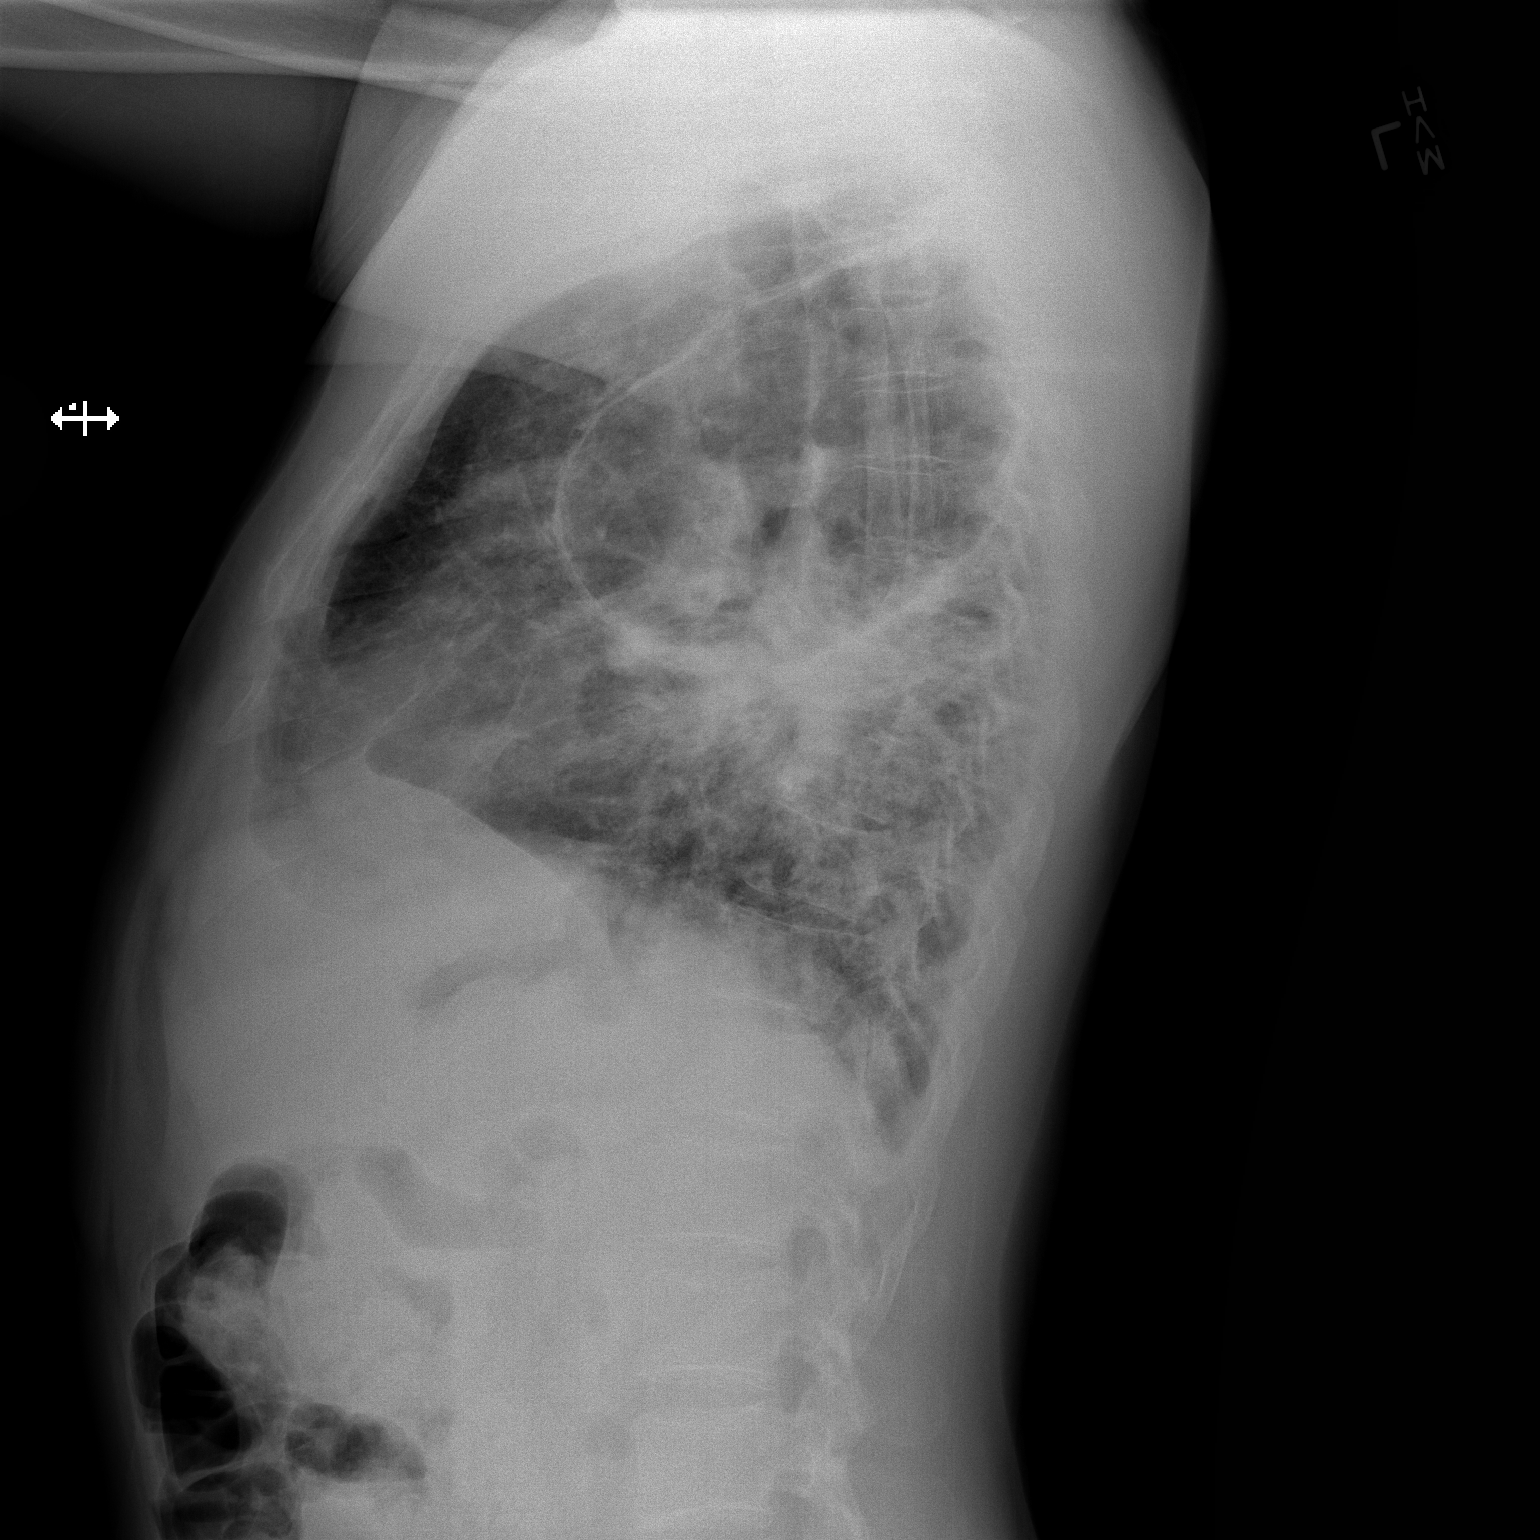

[2 of 2 positions shown; findings below may reference images not displayed]

FINDINGS: Grossly unchanged cardiac silhouette and mediastinal contours.
Grossly unchanged bulla involving majority the right upper lung.
Grossly unchanged bilateral mid lung linear heterogeneous and
coarse reticular opacities.  Likely interval progression of
bibasilar coarse reticular opacities, similar to more recent
examinations.  Given extensive background parenchymal
abnormalities, there are no new discrete focal airspace opacities.
No pleural effusion or pneumothorax.  Stable postsurgical change /
erosion of the distal end of the right clavicle.
IMPRESSION: Extensive bilateral parenchymal abnormalities compatible with
provided history of pulmonary sarcoidosis.  While a focal discrete
focal airspace opacity is not identified, given background
parenchymal abnormalities, it is difficult to exclude an acute on
chronic process.

## 2012-08-30 ENCOUNTER — Telehealth: Payer: Self-pay | Admitting: Emergency Medicine

## 2012-08-30 NOTE — Telephone Encounter (Signed)
Called spoke with patient who reports increased SOB, "struggling to breathe a little bit", prod cough with brown mucus, wheezing, some chest tightness, chills x 2 days -- also reports some swelling at his neck and tops of both shoulders that he noticed yesterday.  Pt deny fever, sweats, nausea, vomiting.  Advised pt that he does need to be seen today preferably and that [facility] has openings today >> pt declined ov with [facility] today as he has to pick his friend up from class.  [facility] also has opening tomorrow morning at 0900 -- offered this appt to patient and he stated that he will need to check and call back.  I have held this appt while waiting for pt to call back.  [facility] also has openings on 8.22.14.  [facility] with no openings until 9.11.14; [facility] with no openings this week.  Will hold in triage.

## 2012-08-30 NOTE — Telephone Encounter (Signed)
I spoke with pt. He scheduled appt with [facility] tomorrow am at 9. Nothing further needed

## 2012-08-30 NOTE — Telephone Encounter (Signed)
Patient returning call.

## 2012-08-31 ENCOUNTER — Ambulatory Visit (INDEPENDENT_AMBULATORY_CARE_PROVIDER_SITE_OTHER)
Admission: RE | Admit: 2012-08-31 | Discharge: 2012-08-31 | Disposition: A | Discharge status: Home / Self Care | Payer: 59 | Source: Ambulatory Visit | Attending: Pulmonary Disease | Admitting: Pulmonary Disease

## 2012-08-31 ENCOUNTER — Encounter: Payer: Self-pay | Admitting: Pulmonary Disease

## 2012-08-31 ENCOUNTER — Ambulatory Visit (INDEPENDENT_AMBULATORY_CARE_PROVIDER_SITE_OTHER): Payer: 59 | Admitting: Pulmonary Disease

## 2012-08-31 VITALS — BP 148/98 | HR 84 | Temp 98.0°F | Ht 67.5 in | Wt 182.4 lb

## 2012-08-31 DIAGNOSIS — J209 Acute bronchitis, unspecified: Secondary | ICD-10-CM

## 2012-08-31 DIAGNOSIS — R0609 Other forms of dyspnea: Secondary | ICD-10-CM

## 2012-08-31 DIAGNOSIS — R0600 Dyspnea, unspecified: Secondary | ICD-10-CM

## 2012-08-31 DIAGNOSIS — D869 Sarcoidosis, unspecified: Secondary | ICD-10-CM

## 2012-08-31 MED ORDER — LEVOFLOXACIN 750 MG PO TABS: 750.0000 mg | ORAL_TABLET | Freq: Every day | ORAL | Status: DC

## 2012-08-31 MED ORDER — PREDNISONE 10 MG PO TABS: ORAL_TABLET | ORAL | Status: DC

## 2012-08-31 NOTE — Assessment & Plan Note (Signed)
The patient's chest x-ray today shows no acute pneumothorax, but there is slightly increased infiltrates bilaterally.  It is unclear whether this is related to his sarcoid, or whether he may have a superimposed infection.  Because of his increasing chest congestion and mucus, will treat with an antibiotic.  He has a history of positive cultures for Pseudomonas and staph, and we'll treat with Levaquin.  Unfortunately, there are no sensitivities available to the Pseudomonas with Levaquin.  We'll have to see how he responds clinically.  We'll also treat him with a course of prednisone given his increasing shortness of breath.

## 2012-08-31 NOTE — Addendum Note (Signed)
Addended by: Maisie Fus on: 08/31/2012 10:18 AM   Modules accepted: Orders

## 2012-08-31 NOTE — Patient Instructions (Addendum)
Will treat with levaquin 750mg  one each day for 10 days. Prednisone taper over 8 days. Need followup with Dr. Delton Coombes in 2 weeks.

## 2012-08-31 NOTE — Addendum Note (Signed)
Addended by: Maisie Fus on: 08/31/2012 10:27 AM   Modules accepted: Orders

## 2012-08-31 NOTE — Progress Notes (Signed)
  Subjective:    Patient ID: [name], male    DOB: 05/16/1965, 47 y.o.   MRN: [id number]  HPI The patient comes in today for an acute sick visit.  He has a history of end-stage sarcoidosis, and apparently is being considered for lung transplantation.  His course has been complicated by recurrent pneumothoraces, and is status post stapling and pleurodesis on the left this summer.  He gives a history of a 3-4 day increasing shortness of breath, increased chest congestion with thick mucus, and increased quantity of mucus that is purulent.  He also has noted the last few days swelling in his neck and chest which has also been noted by his family member.  He denies feeling any crepitus.  He has not had any fevers, chills, or sweats.   Review of Systems  Constitutional: Negative for fever and unexpected weight change.  HENT: Negative for ear pain, nosebleeds, congestion, sore throat, rhinorrhea, sneezing, trouble swallowing, dental problem, postnasal drip and sinus pressure.   Eyes: Negative for redness and itching.  Respiratory: Positive for cough, chest tightness, shortness of breath and wheezing.   Cardiovascular: Positive for chest pain. Negative for palpitations and leg swelling.  Gastrointestinal: Negative for nausea and vomiting.  Genitourinary: Negative for dysuria.  Musculoskeletal: Negative for joint swelling.  Skin: Negative for rash.  Neurological: Negative for headaches.  Hematological: Does not bruise/bleed easily.  Psychiatric/Behavioral: Negative for dysphoric mood. The patient is not nervous/anxious.        Objective:   Physical Exam Well-developed male in no acute distress Nose without purulence or discharge noted Oropharynx clear Neck without lymphadenopathy or thyromegaly. No obvious swelling noted in neck or shoulders, and no evidence for crepitus. Chest with decreased breath sounds throughout, and a few basilar crackles.  No wheezing Cardiac exam is regular rate and  rhythm Lower extremities without edema, no cyanosis Alert and oriented, moves all 4 extremities.       Assessment & Plan:

## 2012-09-08 ENCOUNTER — Ambulatory Visit: Payer: 59 | Admitting: Emergency Medicine

## 2012-09-08 ENCOUNTER — Ambulatory Visit (HOSPITAL_COMMUNITY): Payer: 59

## 2012-09-15 ENCOUNTER — Encounter: Payer: Self-pay | Admitting: Emergency Medicine

## 2012-09-15 ENCOUNTER — Ambulatory Visit (INDEPENDENT_AMBULATORY_CARE_PROVIDER_SITE_OTHER): Payer: 59 | Admitting: Emergency Medicine

## 2012-09-15 VITALS — BP 142/100 | HR 107 | Temp 98.6°F | Ht 67.5 in | Wt 182.8 lb

## 2012-09-15 DIAGNOSIS — D869 Sarcoidosis, unspecified: Secondary | ICD-10-CM

## 2012-09-15 DIAGNOSIS — Z23 Encounter for immunization: Secondary | ICD-10-CM

## 2012-09-15 NOTE — Patient Instructions (Addendum)
Please continue your current medications We will make your lung transplant referral to [facility] We will work on getting a portable oxygen concentrator You may restart her CPAP every night Flu shot today Follow with Dr Delton Coombes in 3 months or sooner if you have any problems.

## 2012-09-15 NOTE — Progress Notes (Signed)
Subjective:    Patient ID: [name], male    DOB: Jan 27, 1965, 47 y.o.   MRN: [id number]  HPI 47 yo man, never smoker, hx sarcoidosis dx by transbronchial bx's in 2002 and again in 2011 or 2012, also OSA on CPAP. He has occasional flares that sound asthmatic in nature, gets treated with prednisone. He flares typically a few times a year, but he has also gone for over a year without an exacerbation. He has never been on everyday meds, has been on pred with exacerbations - longest he was on it was 90 days. He has also been on a steroid-sparing agent before (?MTX), but it was stopped. He has a large cavity in R lung, scattered infiltrates. He has been on Advair before, Spiriva before, not currently. Has albuterol available prn, uses almost every day. Last PFT were last year at [facility].   ROV 01/06/12 -- sarcoidosis, R lung cavitary disease. Was recently hospitalized for an acute exacerbation. D/c after pred taper. Last CT scan was 4/13. He is using nebs 2 -3 x a day.   04/06/12  Post Hospital follow up  Patient presents for a post hospital followup. Patient was recently admitted with acute bronchiectasis, exacerbation, complicated by underlying sarcoidosis. Patient has had several recent hospitalizations and emergency room visits.   He was treated with aggressive pulmonary hygiene, antibiotics, and a steroid taper. Since discharge pt reports breathing is doing well overall but still wheezing, SOB, head congestion and prod cough with clear  Mucus .  Patient denies any hemoptysis, orthopnea, PND, or leg swelling. During admission. Patient has had cocaine, positive urine drug screen in his past. He did receive a social work consult. Patient denies any drug use at today's visit.  He was discharged on prednisone to recommend to hold at 20 mg however, patient misunderstood instructions and has stopped all steroids.  ROV 05/18/12 -- sarcoidosis, bronchiectasis and R cavitary disease. Regular f/u visit. Has  been doing well, some intermittent SOB. Taking allegra and benadryl. He is on QVAR daily, off prednisone. Uses SABA rarely. He is reliable w CPAP.   ROV 06/27/12 -- sarcoidosis, bronchiectasis and R cavitary disease. Also OSA, HTN w diastolic dysfxn.  Was admitted for flare obstructive lung disease/sarcoidosis 6/6 - 6/8. He underwent repeat CT scan chest today >> stable bullous and parenchymal changes compared with 02/2012. He feels back to baseline clinically. Having some cough, clear phlegm. On flonase, allegra and benadryl.   post hospital followup. Patient returns for a post hospital followup. Patient had a complicated hospital course, which he was admitted June 30, and discharged on 08/04/2012. Patient was admitted with acute respiratory failure with a left tension pneumothorax requiring mechanical ventilation. Patient had a persistent left pneumothorax with subcutaneous emphysema around his chest tube. He was seen by thoracic surgery and underwent a left video-assisted thoracoscopy on July 7 with stapling of apical blebs and mechanical thoracentesis. Pt cont w/ dyspnea/fevers.  CT scan on July 20 showed a, moderate left pleural effusion with loculation anteriorly/? HCAP >Underwent thoracentesis was done with 90 cc removed. Analysis was consistent with an exudative effusion and his culture data was negative. He was treated with abx and discharged on Levaquin to complete a ten-day course. And steroid taper-which he has a  few days left.  He was seen by thoracic surgery yesterday.chest x-ray showed a mild right apical pneumothorax. That is unchanged from previous exam.   Since discharge he is feeling better but still very weak.  Feels  breathing  is doing well overall.  does report some pain at the incision site and in the left lung.  finished levaquin yesterday, still taking prednisone at 3tabs daily. No hemoptysis , orthopnea or edema.   ROV 09/15/12 - sarcoidosis, bronchiectasis and R cavitary disease,  complicated hospitalization for PTX as above. Hasn't been using his CPAP in order to avoid positive pressure following his pneumothorax. He was seen 8/21 and treated with levofloxacin and prednisone for bronchitis versus pneumonia. He needs his portable concentrator, [employer] doesn't provide. He also cannot get transplant eval at [facility], needs referral to [facility]. Uses albuterol prn, about once a day. Pulmicort bid.     Objective:   Physical Exam  Filed Vitals:   09/15/12 1402  BP: 142/100  Pulse: 107  Temp: 98.6 F (37 C)  TempSrc: Oral  Height: 5' 7.5" (1.715 m)  Weight: 182 lb 12.8 oz (82.918 kg)  SpO2: 96%    Gen: Pleasant, well-nourished, in no distress,  normal affect  ENT: No lesions,  mouth clear,  oropharynx clear, no postnasal drip  Neck: No JVD, no TMG, no carotid bruits  Lungs:  Very distant, Diminshed BS in bases   Cardiovascular: RRR, heart sounds normal, no murmur or gallops, no peripheral edema  Musculoskeletal: No deformities, no cyanosis or clubbing  Neuro: alert, non focal  Skin: Warm, no lesions or rashes  CXR 08/09/12 Mild right apical pneumothorax is noted which is unchanged compared . Stable bilateral lung opacities compared to prior exam consistent with history of sarcoidosis.   CT chest 07/30/12 >Increase in a small to moderate left pleural effusion since  most recent examination. Component along the anterior chest wall  has a lenticular configuration and may be loculated.  2. Perihilar opacities and traction bronchiectasis consistent with  history of sarcoidosis. Extent of opacity in the superior segment  left lower lobe appears somewhat increased since the 06/27/2012  study but not notably changed compared 02/14/12. This could be due  to increased atelectasis or pneumonia.  3. Negative for pneumothorax. Extensive subcutaneous air about  the chest noted.  CT chest 06/27/12 --  Comparison: CTA chest dated 02/14/2012  Findings: 10.1 x 7.8 cm thin-walled  cavitary/bullous lesion in the  posterior right upper lobe (series 4/image 20), unchanged.  Confluent perihilar opacities with traction bronchiectasis and  architectural distortion, related to known sarcoidosis. Associated  peribronchovascular nodularity bilaterally.  On inspiratory / expiratory imaging, there is no evidence of air  trapping.  No superimposed opacities suspicious for pneumonia. No pleural  effusion or pneumothorax.  Heart is normal in size. No pericardial effusion.  Partially calcified mediastinal lymphadenopathy, including:  --1.3 cm short-axis right paratracheal node (series 2/image 17)  --1.0 cm short-axis AP window node (series 2/image 18)  --1.9 cm short-axis subcarinal node (series 2/image 27)  Associated hilar lymphadenopathy is suspected but difficult to  discretely measure in the absence of intravenous contrast  administration. The overall appearance is unchanged.  Visualized upper abdomen is unremarkable.  Visualized osseous structures are within normal limits.       Assessment & Plan:   Sarcoid Please continue your current medications We will make your lung transplant referral to [facility] We will work on getting a portable oxygen concentrator You may restart her CPAP every night Flu shot today Follow with Dr Delton Coombes in 3 months or sooner if you have any problems

## 2012-09-15 NOTE — Assessment & Plan Note (Signed)
Please continue your current medications We will make your lung transplant referral to [facility] We will work on getting a portable oxygen concentrator You may restart her CPAP every night Flu shot today Follow with Dr Greysyn Vanderberg in 3 months or sooner if you have any problems.  

## 2012-10-06 IMAGING — CR DG CHEST 1V PORT
1 series · 1 of 1 positions shown · non-contrast
Comparison: 06/15/2011

CLINICAL DATA: Respiratory distress

PORTABLE CHEST - 1 VIEW

[AP]
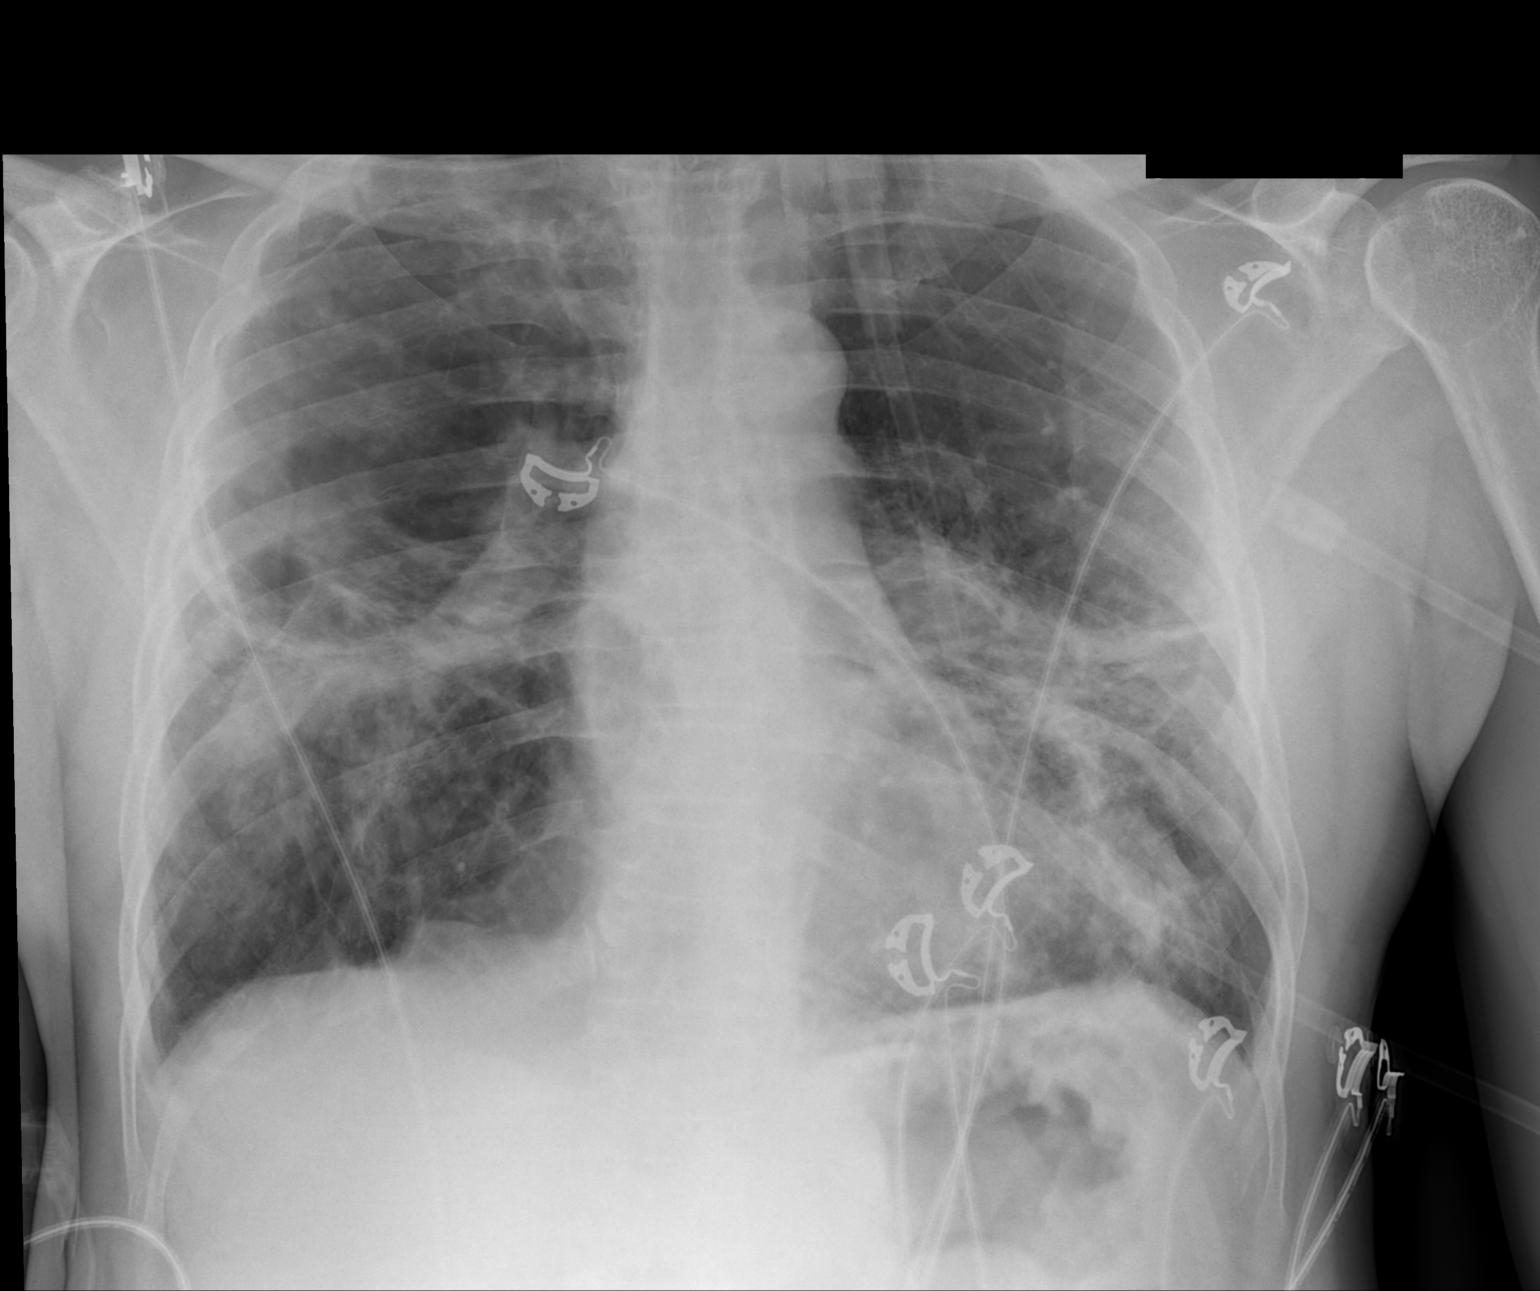

[1 of 1 positions shown; findings below may reference images not displayed]

FINDINGS: The cardiac shadow is stable.  Bilateral chronic changes
are again seen.  No definitive acute infiltrate is noted.  No
pneumothorax is seen.  No sizable effusion is noted.
IMPRESSION: Chronic changes without definitive acute abnormality.

## 2012-10-09 ENCOUNTER — Encounter (HOSPITAL_COMMUNITY): Payer: Self-pay

## 2012-10-09 ENCOUNTER — Encounter (HOSPITAL_COMMUNITY)
Admission: RE | Admit: 2012-10-09 | Discharge: 2012-10-09 | Disposition: A | Discharge status: Home / Self Care | Payer: 59 | Source: Ambulatory Visit | Attending: Emergency Medicine | Admitting: Emergency Medicine

## 2012-10-09 DIAGNOSIS — D869 Sarcoidosis, unspecified: Secondary | ICD-10-CM | POA: Insufficient documentation

## 2012-10-09 DIAGNOSIS — I4892 Unspecified atrial flutter: Secondary | ICD-10-CM | POA: Insufficient documentation

## 2012-10-09 DIAGNOSIS — J9610 Chronic respiratory failure, unspecified whether with hypoxia or hypercapnia: Secondary | ICD-10-CM | POA: Insufficient documentation

## 2012-10-09 NOTE — Progress Notes (Signed)
[name] is here today for orientation to Pulmonary Rehab.  He arrived by wheelchair, accompanied by significant other.  He has portable oxygen with him , not using because he is sitting down.  Neatly dressed, skin warm and dry , color good.  Oriented to time and place.  Balance and gait good with ambulation.  He does state he has difficulty with using his right arm due to past shoulder surgery.  Limited use, arm and fingers tingle.  He uses Lidocaine patch for pain control.  He does not get total relief.  Heart rate regular, BP is 138/96.  He is out of his medication. States he called yesterday, pharmacy had to call MD, will try to pick it up today.  No peripheral edema noted.  Department expectations and goals for Pulmonary Rehab reviewed with patient.  He would like to be able to do more activity at home in taking care of maintenance, get out more with his family and grandchild, be able to just breathe better.  Demonstration and practice of PLB using pulse oximeter.  Patient able to return demonstration satisfactorily. Safety and hand hygiene in the exercise area reviewed with patient.  Patient voices understanding. Walk test scheduled for 10/10/2012 and he will start exercise on 10/12/12.  We look forward to helping this nice gentleman.   2:15 to 3:22 pm Cathie Olden RN

## 2012-10-10 ENCOUNTER — Encounter (HOSPITAL_COMMUNITY): Payer: 59

## 2012-10-10 ENCOUNTER — Ambulatory Visit (HOSPITAL_COMMUNITY): Payer: 59

## 2012-10-10 ENCOUNTER — Encounter (HOSPITAL_COMMUNITY)
Admission: RE | Admit: 2012-10-10 | Discharge: 2012-10-10 | Disposition: A | Discharge status: Home / Self Care | Payer: 59 | Source: Ambulatory Visit | Attending: Emergency Medicine | Admitting: Emergency Medicine

## 2012-10-12 ENCOUNTER — Ambulatory Visit (HOSPITAL_COMMUNITY): Payer: 59

## 2012-10-12 ENCOUNTER — Encounter (HOSPITAL_COMMUNITY)
Admission: RE | Admit: 2012-10-12 | Discharge: 2012-10-12 | Disposition: A | Discharge status: Home / Self Care | Payer: 59 | Source: Ambulatory Visit | Attending: Emergency Medicine | Admitting: Emergency Medicine

## 2012-10-12 DIAGNOSIS — I4892 Unspecified atrial flutter: Secondary | ICD-10-CM | POA: Insufficient documentation

## 2012-10-12 DIAGNOSIS — Z5189 Encounter for other specified aftercare: Secondary | ICD-10-CM | POA: Insufficient documentation

## 2012-10-12 DIAGNOSIS — J9610 Chronic respiratory failure, unspecified whether with hypoxia or hypercapnia: Secondary | ICD-10-CM | POA: Insufficient documentation

## 2012-10-12 DIAGNOSIS — D869 Sarcoidosis, unspecified: Secondary | ICD-10-CM | POA: Insufficient documentation

## 2012-10-13 ENCOUNTER — Ambulatory Visit (HOSPITAL_COMMUNITY): Payer: 59

## 2012-10-16 ENCOUNTER — Ambulatory Visit (HOSPITAL_COMMUNITY): Payer: 59

## 2012-10-17 ENCOUNTER — Encounter (HOSPITAL_COMMUNITY)
Admission: RE | Admit: 2012-10-17 | Discharge: 2012-10-17 | Disposition: A | Discharge status: Home / Self Care | Payer: 59 | Source: Ambulatory Visit | Attending: Emergency Medicine | Admitting: Emergency Medicine

## 2012-10-17 ENCOUNTER — Ambulatory Visit (HOSPITAL_COMMUNITY): Payer: 59

## 2012-10-18 ENCOUNTER — Ambulatory Visit (HOSPITAL_COMMUNITY): Payer: 59

## 2012-10-19 ENCOUNTER — Encounter (HOSPITAL_COMMUNITY): Payer: 59

## 2012-10-19 ENCOUNTER — Ambulatory Visit (HOSPITAL_COMMUNITY): Payer: 59

## 2012-10-20 ENCOUNTER — Ambulatory Visit (HOSPITAL_COMMUNITY): Payer: 59

## 2012-10-23 ENCOUNTER — Ambulatory Visit (HOSPITAL_COMMUNITY): Payer: 59

## 2012-10-24 ENCOUNTER — Encounter (HOSPITAL_COMMUNITY)
Admission: RE | Admit: 2012-10-24 | Discharge: 2012-10-24 | Disposition: A | Discharge status: Home / Self Care | Payer: 59 | Source: Ambulatory Visit | Attending: Emergency Medicine | Admitting: Emergency Medicine

## 2012-10-24 ENCOUNTER — Ambulatory Visit (HOSPITAL_COMMUNITY): Payer: 59

## 2012-10-24 NOTE — Progress Notes (Signed)
Karas experienced nausea at the end of his 2nd exercise station.  Heart rate 96, BP 104/60, oxygen saturation 98% on 2 liters of oxygen.  Patient sat during the 3rd station and went home.  He said he awoke this morning with nausea, but it resolved later in the morning.  There is a virus going around with a nausea component, patient was afebrile.

## 2012-10-25 ENCOUNTER — Ambulatory Visit (HOSPITAL_COMMUNITY): Payer: 59

## 2012-10-26 ENCOUNTER — Ambulatory Visit (HOSPITAL_COMMUNITY): Payer: 59

## 2012-10-26 ENCOUNTER — Encounter (HOSPITAL_COMMUNITY): Payer: 59

## 2012-10-27 ENCOUNTER — Ambulatory Visit (HOSPITAL_COMMUNITY): Payer: 59

## 2012-10-30 ENCOUNTER — Ambulatory Visit (HOSPITAL_COMMUNITY): Payer: 59

## 2012-10-31 ENCOUNTER — Encounter (HOSPITAL_COMMUNITY): Admission: RE | Admit: 2012-10-31 | Payer: 59 | Source: Ambulatory Visit

## 2012-10-31 ENCOUNTER — Telehealth (HOSPITAL_COMMUNITY): Payer: Self-pay | Admitting: *Deleted

## 2012-10-31 ENCOUNTER — Ambulatory Visit (HOSPITAL_COMMUNITY): Payer: 59

## 2012-11-01 ENCOUNTER — Ambulatory Visit (HOSPITAL_COMMUNITY): Payer: 59

## 2012-11-02 ENCOUNTER — Encounter (HOSPITAL_COMMUNITY): Payer: 59

## 2012-11-02 ENCOUNTER — Ambulatory Visit (HOSPITAL_COMMUNITY): Payer: 59

## 2012-11-03 ENCOUNTER — Ambulatory Visit (HOSPITAL_COMMUNITY): Payer: 59

## 2012-11-06 ENCOUNTER — Ambulatory Visit (HOSPITAL_COMMUNITY): Payer: 59

## 2012-11-07 ENCOUNTER — Encounter (HOSPITAL_COMMUNITY)
Admission: RE | Admit: 2012-11-07 | Discharge: 2012-11-07 | Disposition: A | Discharge status: Home / Self Care | Payer: 59 | Source: Ambulatory Visit | Attending: Emergency Medicine | Admitting: Emergency Medicine

## 2012-11-07 ENCOUNTER — Ambulatory Visit (HOSPITAL_COMMUNITY): Payer: 59

## 2012-11-08 ENCOUNTER — Ambulatory Visit (HOSPITAL_COMMUNITY): Payer: 59

## 2012-11-09 ENCOUNTER — Ambulatory Visit (HOSPITAL_COMMUNITY): Payer: 59

## 2012-11-09 ENCOUNTER — Encounter (HOSPITAL_COMMUNITY)
Admission: RE | Admit: 2012-11-09 | Discharge: 2012-11-09 | Disposition: A | Discharge status: Home / Self Care | Payer: 59 | Source: Ambulatory Visit | Attending: Emergency Medicine | Admitting: Emergency Medicine

## 2012-11-10 ENCOUNTER — Ambulatory Visit (HOSPITAL_COMMUNITY): Payer: 59

## 2012-11-13 ENCOUNTER — Ambulatory Visit (HOSPITAL_COMMUNITY): Payer: 59

## 2012-11-14 ENCOUNTER — Encounter (HOSPITAL_COMMUNITY): Payer: 59

## 2012-11-14 ENCOUNTER — Ambulatory Visit (HOSPITAL_COMMUNITY): Payer: 59

## 2012-11-14 ENCOUNTER — Telehealth (HOSPITAL_COMMUNITY): Payer: Self-pay | Admitting: *Deleted

## 2012-11-15 ENCOUNTER — Ambulatory Visit (HOSPITAL_COMMUNITY): Payer: 59

## 2012-11-16 ENCOUNTER — Ambulatory Visit (HOSPITAL_COMMUNITY): Payer: 59

## 2012-11-16 ENCOUNTER — Encounter (HOSPITAL_COMMUNITY)
Admission: RE | Admit: 2012-11-16 | Discharge: 2012-11-16 | Disposition: A | Discharge status: Home / Self Care | Payer: 59 | Source: Ambulatory Visit | Attending: Emergency Medicine | Admitting: Emergency Medicine

## 2012-11-16 ENCOUNTER — Telehealth: Payer: Self-pay | Admitting: Emergency Medicine

## 2012-11-16 DIAGNOSIS — I4892 Unspecified atrial flutter: Secondary | ICD-10-CM | POA: Insufficient documentation

## 2012-11-16 DIAGNOSIS — J9610 Chronic respiratory failure, unspecified whether with hypoxia or hypercapnia: Secondary | ICD-10-CM | POA: Insufficient documentation

## 2012-11-16 DIAGNOSIS — D869 Sarcoidosis, unspecified: Secondary | ICD-10-CM | POA: Insufficient documentation

## 2012-11-16 DIAGNOSIS — Z5189 Encounter for other specified aftercare: Secondary | ICD-10-CM | POA: Insufficient documentation

## 2012-11-16 NOTE — Progress Notes (Signed)
Patient came to exercise in pulmonary rehab and complained of green sputum, on auscultation expiratory wheezing in right lung field.  Patient increased oxygen to 3 liters to keep oxygen saturations at 94%.  Heart rate  82, blood pressure 134/74.  Called Dr. Kavin Leech office with above information.  Given appointment for Friday, November 7th @ 1200 with Dr. Shelle Iron.  Gave patient information.

## 2012-11-16 NOTE — Telephone Encounter (Signed)
I spoke with Aurea Graff. Pt c/o prod cough, wheezing x 1 week. Pt is scheduled to come in and see [facility] tomorrow for acute visit.

## 2012-11-17 ENCOUNTER — Ambulatory Visit (HOSPITAL_COMMUNITY): Payer: 59

## 2012-11-17 ENCOUNTER — Ambulatory Visit (INDEPENDENT_AMBULATORY_CARE_PROVIDER_SITE_OTHER): Payer: 59 | Admitting: Pulmonary Disease

## 2012-11-17 ENCOUNTER — Encounter: Payer: Self-pay | Admitting: Pulmonary Disease

## 2012-11-17 VITALS — BP 132/94 | HR 100 | Temp 98.0°F | Ht 67.5 in | Wt 177.4 lb

## 2012-11-17 DIAGNOSIS — J209 Acute bronchitis, unspecified: Secondary | ICD-10-CM

## 2012-11-17 DIAGNOSIS — D869 Sarcoidosis, unspecified: Secondary | ICD-10-CM

## 2012-11-17 DIAGNOSIS — J471 Bronchiectasis with (acute) exacerbation: Secondary | ICD-10-CM

## 2012-11-17 MED ORDER — PREDNISONE 10 MG PO TABS: ORAL_TABLET | ORAL | Status: DC

## 2012-11-17 MED ORDER — CIPROFLOXACIN HCL 750 MG PO TABS: 750.0000 mg | ORAL_TABLET | Freq: Two times a day (BID) | ORAL | Status: DC

## 2012-11-17 NOTE — Progress Notes (Signed)
  Subjective:    Patient ID: [name], male    DOB: 1965/10/01, 47 y.o.   MRN: [id number]  HPI The patient comes in today for acute sick visit.  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  He gives a 5 day history of worsening chest congestion, cough with purulent mucus, increased wheezing, and increased shortness of breath.  He does not have any fevers, chills, or sweats.  He has a history of a sputum culture growing Pseudomonas and staph aureus   Review of Systems  Constitutional: Negative for fever and unexpected weight change.  HENT: Negative for congestion, dental problem, ear pain, nosebleeds, postnasal drip, rhinorrhea, sinus pressure, sneezing, sore throat and trouble swallowing.   Eyes: Negative for redness and itching.  Respiratory: Negative for cough, chest tightness, shortness of breath and wheezing.   Cardiovascular: Negative for palpitations and leg swelling.  Gastrointestinal: Negative for nausea and vomiting.  Genitourinary: Negative for dysuria.  Musculoskeletal: Negative for joint swelling.  Skin: Negative for rash.  Neurological: Negative for headaches.  Hematological: Does not bruise/bleed easily.  Psychiatric/Behavioral: Negative for dysphoric mood. The patient is not nervous/anxious.        Objective:   Physical Exam Thin male in no acute distress Nose without purulence or discharge noted Oropharynx clear Neck without lymphadenopathy or thyromegaly Chest with very diminished breath sounds bilaterally, a few expiratory wheezes Cardiac exam with a mild tachycardia, but regular Lower extremities without edema, no cyanosis Alert and oriented, moves all 4 extremities.       Assessment & Plan:

## 2012-11-17 NOTE — Assessment & Plan Note (Signed)
The patient has a history of stage IV sarcoid with bronchiectasis, and is describing an acute exacerbation.  He will need antibiotics to cover his pulmonary infection, as well as a short course of steroids for his increased shortness of breath and wheezing.  He has current pseudomonas and staph aureus in the past, and the pseudomonas has been sensitive to Cipro.  His staph has been sensitive to fluoroquinolones.

## 2012-11-17 NOTE — Patient Instructions (Signed)
Will treat with cipro 750mg  two times a day for 7 days Prednisone taper over 8 days. Can take mucinex if mucus difficult to mobilize Keep followup apptm with Dr. Delton Coombes, but call if you are not improving.

## 2012-11-20 ENCOUNTER — Ambulatory Visit (HOSPITAL_COMMUNITY): Payer: 59

## 2012-11-21 ENCOUNTER — Ambulatory Visit (HOSPITAL_COMMUNITY): Payer: 59

## 2012-11-21 ENCOUNTER — Encounter (HOSPITAL_COMMUNITY): Payer: 59

## 2012-11-22 ENCOUNTER — Ambulatory Visit (HOSPITAL_COMMUNITY): Payer: 59

## 2012-11-22 IMAGING — CR DG HAND COMPLETE 3+V*L*
3 series · 3 of 3 positions shown · non-contrast
Comparison: None.

CLINICAL DATA: Left hand pain and swelling.  No known injury.

LEFT HAND - COMPLETE 3+ VIEW

[x hand pa left]
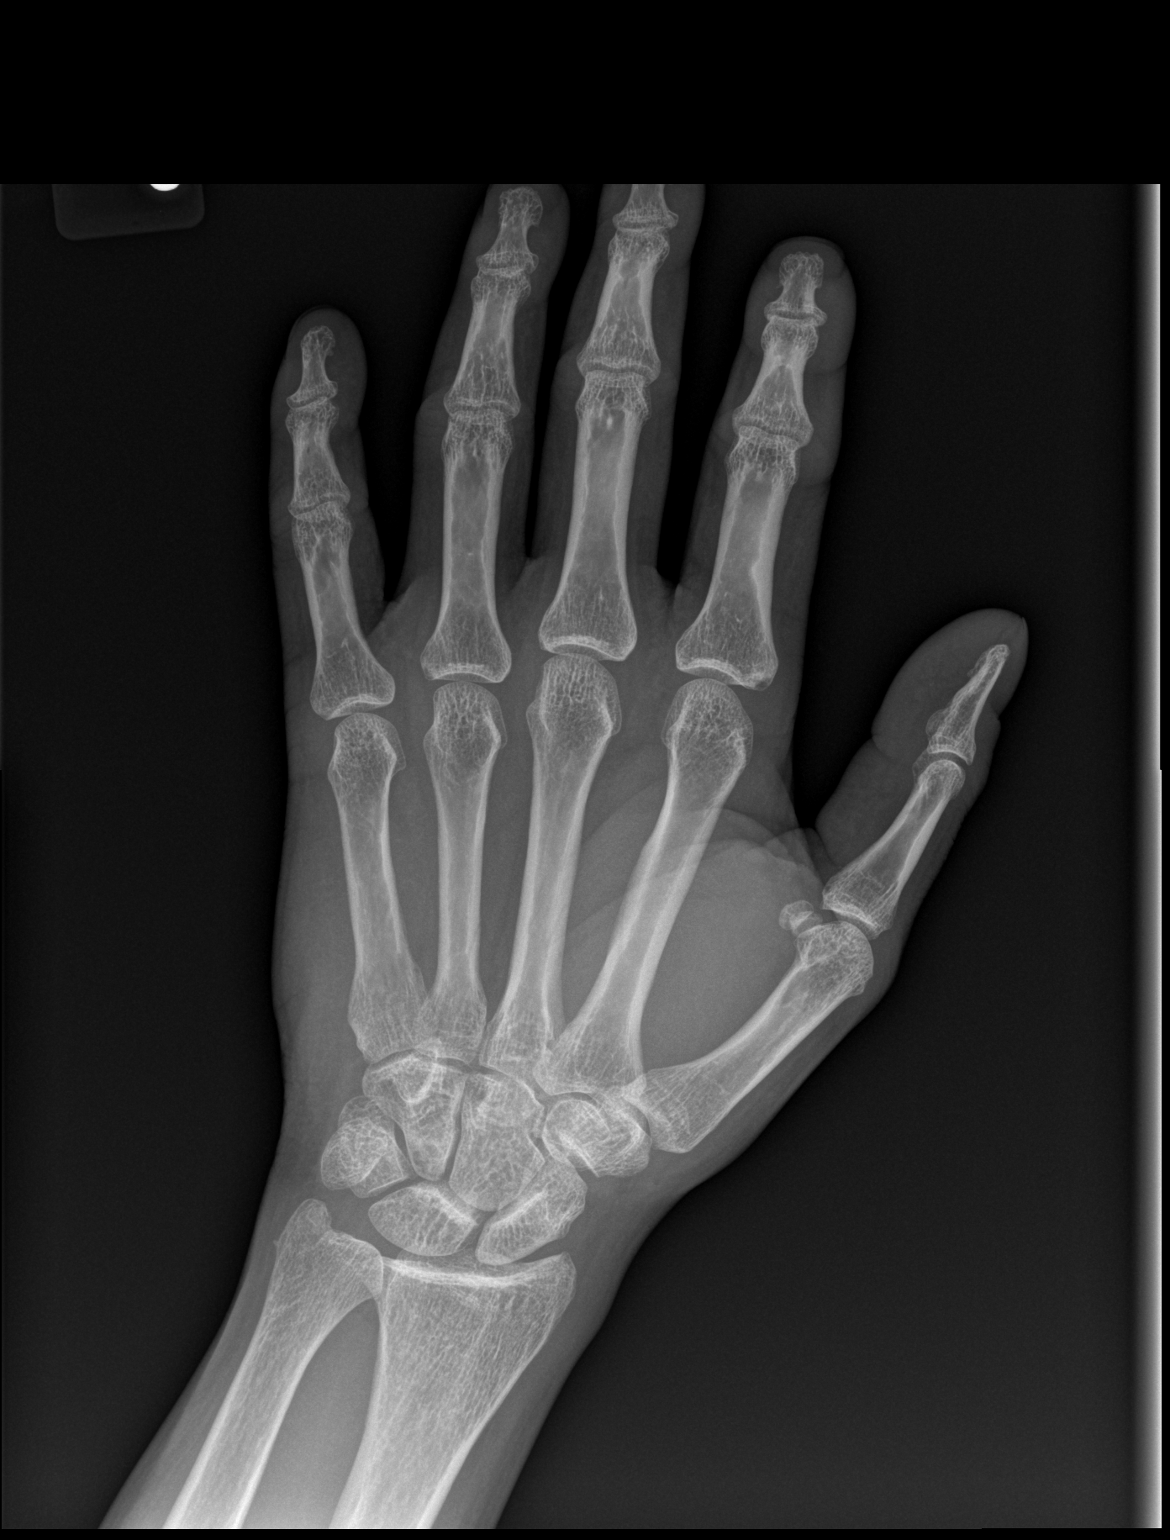

[x hand obl left]
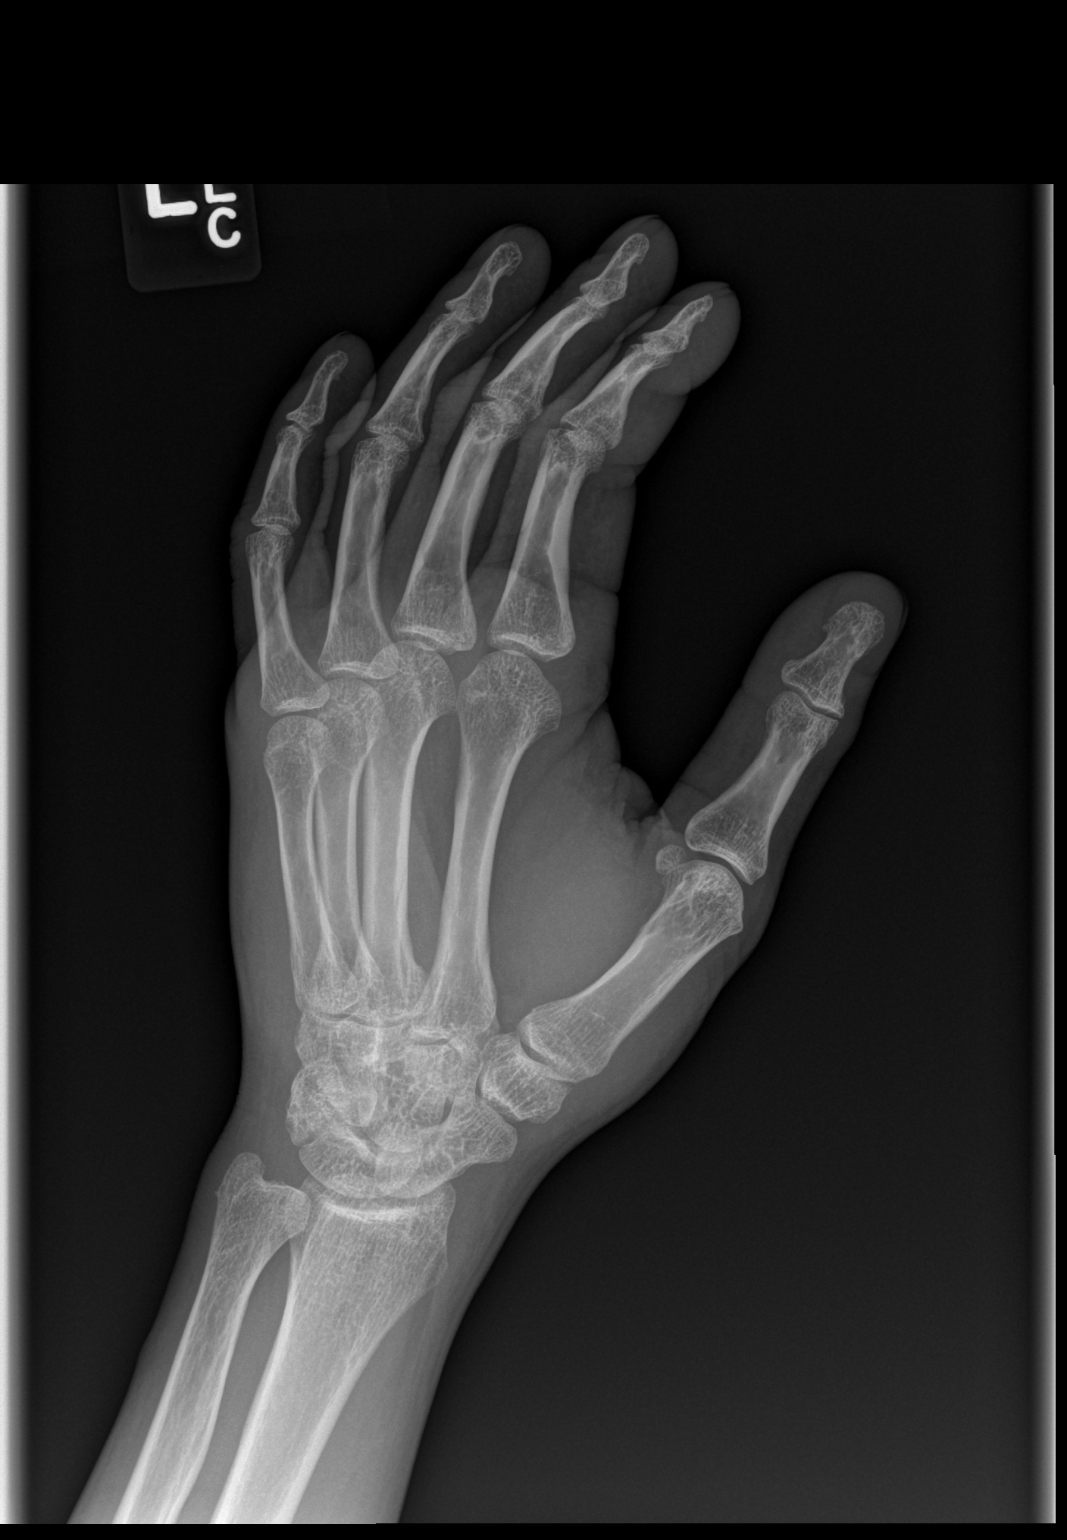

[x hand lat left]
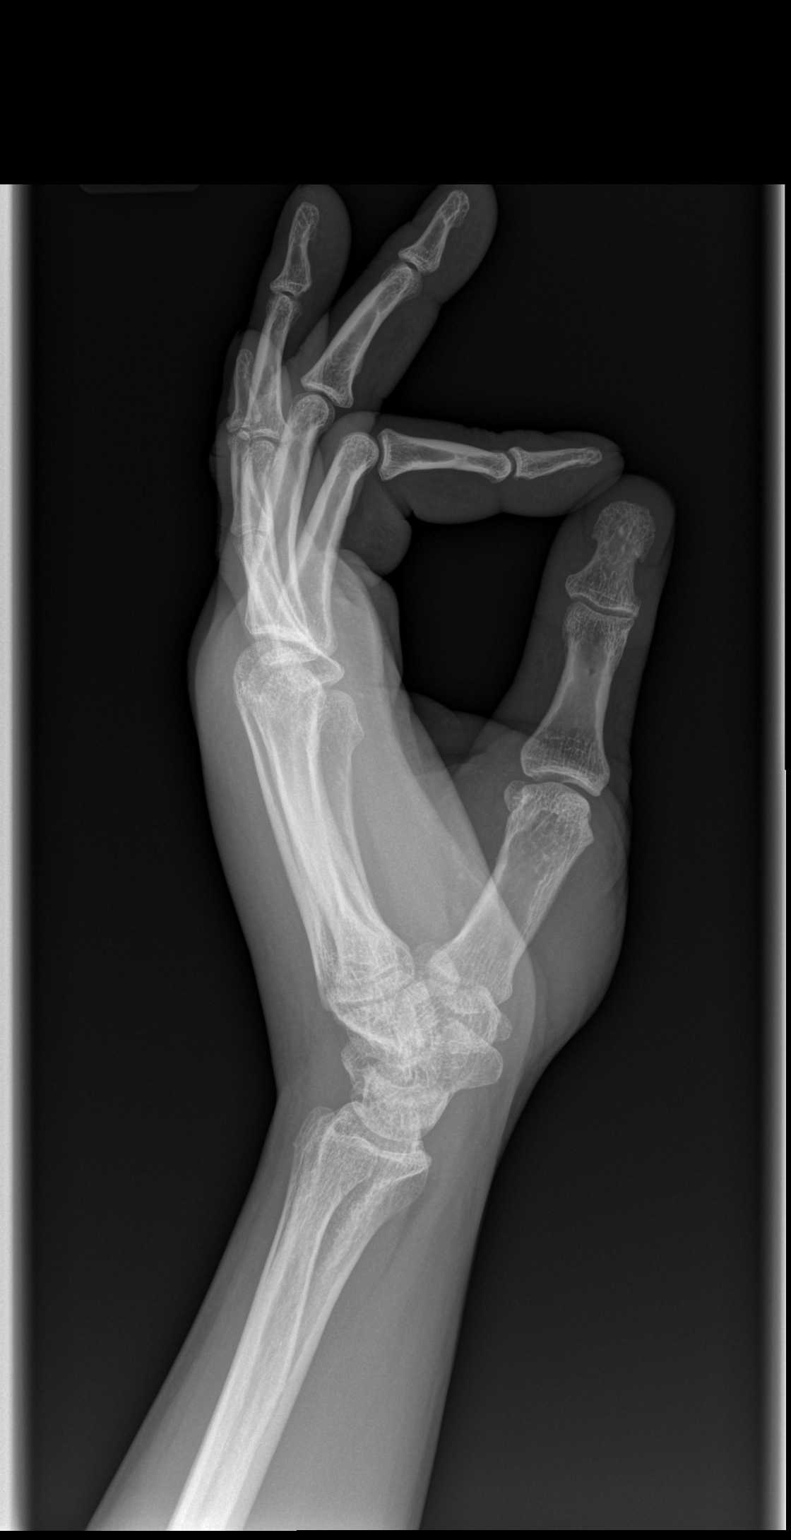

[3 of 3 positions shown; findings below may reference images not displayed]

FINDINGS: Mild to moderate dorsal soft tissue swelling.  The bones
have normal appearances.  No soft tissue gas.
IMPRESSION: Soft tissue swelling without underlying bony abnormality.

## 2012-11-23 ENCOUNTER — Encounter (HOSPITAL_COMMUNITY)
Admission: RE | Admit: 2012-11-23 | Discharge: 2012-11-23 | Disposition: A | Discharge status: Home / Self Care | Payer: 59 | Source: Ambulatory Visit | Attending: Emergency Medicine | Admitting: Emergency Medicine

## 2012-11-23 ENCOUNTER — Encounter (HOSPITAL_COMMUNITY): Payer: 59

## 2012-11-23 NOTE — Progress Notes (Signed)
[name] 47 y.o. male Nutrition Note Spoke with pt. Pt is overweight. Pt wants to maintain his current wt. Per pt, "my wt fluctuates when I'm sick and depending on the medication I'm on." Pt eats 3 meals a day; most prepared at home by pt. Pt is a Investment banker, operational, owns a food truck, and enjoys cooking. Per discussion, pt's girlfriend helps with grocery shopping.  There are some ways the pt can make his eating habits healthier.  Pt's Rate Your Plate results reviewed with pt.  Pt avoids many salty foods; uses canned food "occasionally."  Pt adds some salt to food and primarily makes his own seasonings with onion powder, garlic powder, etc. The role of sodium in lung disease reviewed with pt. Pt reports he enjoys consuming juice (e.g. cran-drinks and V8 fusion) and eats candy "in the middle of the night." Limiting added sugar in the diet with lung disease discussed. Pt encouraged to incorporate more whole grains in his diet. Pt currently enjoys fruit loops or apple jacks for breakfast and eats white bread. Pt expressed understanding of the information reviewed. Nutrition Diagnosis   Food-and nutrition-related knowledge deficit related to lack of exposure to information as related to diagnosis of pulmonary disease Nutrition Rx/Est. Daily Nutrition Needs for: ? wt maintenance 2350-2700 Kcal  95-120 gm protein   1500 mg or less sodium      Nutrition Intervention   Pt's individual nutrition plan and goals reviewed with pt.   Benefits of adopting healthy eating habits discussed when pt's Rate Your Plate reviewed.   Pt to attend the Nutrition and Lung Disease class    Continual client-centered nutrition education by RD, as part of interdisciplinary care. Goal(s) 1. Describe the benefit of including fruits, vegetables, whole grains, and low-fat dairy products in a healthy meal plan. Monitor and Evaluate progress toward nutrition goal with team.   Mickle Plumb, M.Ed, RD, LDN, CDE 11/23/2012 2:25 PM

## 2012-11-28 ENCOUNTER — Encounter (HOSPITAL_COMMUNITY)
Admission: RE | Admit: 2012-11-28 | Discharge: 2012-11-28 | Disposition: A | Discharge status: Home / Self Care | Payer: 59 | Source: Ambulatory Visit | Attending: Emergency Medicine | Admitting: Emergency Medicine

## 2012-11-30 ENCOUNTER — Encounter (HOSPITAL_COMMUNITY): Payer: 59

## 2012-12-05 ENCOUNTER — Encounter (HOSPITAL_COMMUNITY)
Admission: RE | Admit: 2012-12-05 | Discharge: 2012-12-05 | Disposition: A | Discharge status: Home / Self Care | Payer: 59 | Source: Ambulatory Visit | Attending: Emergency Medicine | Admitting: Emergency Medicine

## 2012-12-05 ENCOUNTER — Encounter (HOSPITAL_COMMUNITY): Payer: Self-pay

## 2012-12-05 NOTE — Progress Notes (Signed)
I have reviewed a Home Exercise Program with the patient.  He will be walking around his neighborhood for his exercise.  Tyreak is not currently doing any exercise at home.  Shota and I discussed walking 2 times a week for 30 minutes.  If he feels that he is ready he will add 5 minutes onto the duration every 2 weeks.  Raunel states that his goal is to get off his oxygen and be happy.  We reviewed exercise guidelines, target heart rate during exercise, oxygen use, weather, home pulse oximeter, endpoints for exercise, and goals.  Patient has stated that they understand and are encouraged to come to me with any questions.

## 2012-12-07 ENCOUNTER — Encounter (HOSPITAL_COMMUNITY): Payer: 59

## 2012-12-12 ENCOUNTER — Encounter (HOSPITAL_COMMUNITY): Payer: 59

## 2012-12-14 ENCOUNTER — Encounter (HOSPITAL_COMMUNITY): Payer: Self-pay | Admitting: Emergency Medicine

## 2012-12-14 ENCOUNTER — Emergency Department (HOSPITAL_COMMUNITY)
Admission: EM | Admit: 2012-12-14 | Discharge: 2012-12-14 | Disposition: A | Discharge status: Home / Self Care | Payer: 59 | Attending: Emergency Medicine | Admitting: Emergency Medicine

## 2012-12-14 ENCOUNTER — Encounter (HOSPITAL_COMMUNITY): Payer: 59

## 2012-12-14 DIAGNOSIS — I10 Essential (primary) hypertension: Secondary | ICD-10-CM | POA: Insufficient documentation

## 2012-12-14 DIAGNOSIS — Z79899 Other long term (current) drug therapy: Secondary | ICD-10-CM | POA: Insufficient documentation

## 2012-12-14 DIAGNOSIS — G4733 Obstructive sleep apnea (adult) (pediatric): Secondary | ICD-10-CM | POA: Insufficient documentation

## 2012-12-14 DIAGNOSIS — L0231 Cutaneous abscess of buttock: Secondary | ICD-10-CM | POA: Insufficient documentation

## 2012-12-14 DIAGNOSIS — J9610 Chronic respiratory failure, unspecified whether with hypoxia or hypercapnia: Secondary | ICD-10-CM | POA: Insufficient documentation

## 2012-12-14 DIAGNOSIS — Z9981 Dependence on supplemental oxygen: Secondary | ICD-10-CM | POA: Insufficient documentation

## 2012-12-14 DIAGNOSIS — IMO0002 Reserved for concepts with insufficient information to code with codable children: Secondary | ICD-10-CM | POA: Insufficient documentation

## 2012-12-14 DIAGNOSIS — Z8619 Personal history of other infectious and parasitic diseases: Secondary | ICD-10-CM | POA: Insufficient documentation

## 2012-12-14 DIAGNOSIS — Z8701 Personal history of pneumonia (recurrent): Secondary | ICD-10-CM | POA: Insufficient documentation

## 2012-12-14 MED ORDER — OXYCODONE-ACETAMINOPHEN 5-325 MG PO TABS: 1.0000 | ORAL_TABLET | ORAL | Status: DC | PRN

## 2012-12-14 MED ORDER — AMOXICILLIN-POT CLAVULANATE 500-125 MG PO TABS: 1.0000 | ORAL_TABLET | Freq: Three times a day (TID) | ORAL | Status: DC

## 2012-12-14 MED ORDER — OXYCODONE-ACETAMINOPHEN 5-325 MG PO TABS
2.0000 | ORAL_TABLET | Freq: Once | ORAL | Status: AC
  Administered 2012-12-14: 2 via ORAL
  Filled 2012-12-14: qty 2

## 2012-12-14 NOTE — ED Notes (Signed)
Pt alert, nad, resp even unlabored, skin pwd, c/o "sore butt", onset was yesterday

## 2012-12-14 NOTE — ED Provider Notes (Signed)
CSN: [id number]     Arrival date & time 12/14/12  1346 History  This chart was scribed for non-physician practitioner working with Junius Argyle, MD by Ashley Jacobs, ED scribe. This patient was seen in room WTR7/WTR7 and the patient's care was started at 3:22 PM   First MD Initiated Contact with Patient 12/14/12 1413     Chief Complaint  Patient presents with  . Cyst   (Consider location/radiation/quality/duration/timing/severity/associated sxs/prior Treatment) The history is provided by the patient and medical records. No language interpreter was used.   HPI Comments: [name] is a 47 y.o. male who comes to the Emergency Department complaining of cyst to the left intergluteal cleft that presented yesterday. The site is painful with palpation and sitting. He denies fluid drainage. Pt has tried Preparation H, pressure rash cream and Ibuprofen without any relief. Pt denies fever, nausea and vomiting. He denies having a previous hx of rashes, abscesses and cyst. Pt has allergies to Lincomycin. He has a past medical hx of HTN, sarcoidosis, chronic respiratory failure, bronchitis, and pneumonia. He does no smoke tobacco but does drink alcohol socially.   Past Medical History  Diagnosis Date  . Hypertension   . Sarcoidosis   . OSA on CPAP   . Chronic respiratory failure     home oxygen PRN  . Bronchitis   . Pneumonia    Past Surgical History  Procedure Laterality Date  . Rotator cuff repair    . Arm surgery     . Video assisted thoracoscopy (vats)/thorocotomy Left 07/17/2012    Procedure: VIDEO ASSISTED THORACOSCOPY (VATS)/BLEB Stapling;  Surgeon: Alleen Borne, MD;  Location: [facility] OR;  Service: Thoracic;  Laterality: Left;   Family History  Problem Relation Age of Onset  . Diabetes Father    History  Substance Use Topics  . Smoking status: Never Smoker   . Smokeless tobacco: Never Used  . Alcohol Use: Yes     Comment: social    Review of Systems  Constitutional:  Negative for fever.  Gastrointestinal: Negative for nausea and vomiting.  Skin: Positive for wound (left intergluteal cleft).  All other systems reviewed and are negative.    Allergies  Clindamycin/lincomycin  Home Medications   Current Outpatient Rx  Name  Route  Sig  Dispense  Refill  . albuterol (PROVENTIL) (2.5 MG/3ML) 0.083% nebulizer solution      1 vial in nebulizer every 4 hours as needed         . budesonide (PULMICORT) 0.25 MG/2ML nebulizer solution   Nebulization   Take 2 mLs (0.25 mg total) by nebulization 2 (two) times daily.   60 mL   12   . calcium-vitamin D (OSCAL WITH D) 500-200 MG-UNIT per tablet   Oral   Take 1 tablet by mouth every morning.         . cholecalciferol (VITAMIN D) 1000 UNITS tablet   Oral   Take 1,000 Units by mouth every morning.          . cyclobenzaprine (FLEXERIL) 10 MG tablet   Oral   Take 10 mg by mouth 3 (three) times daily as needed. Muscle spasms.         . diphenhydrAMINE (BENADRYL) 25 MG tablet   Oral   Take 25 mg by mouth every 6 (six) hours as needed. Allergies         . docusate sodium (COLACE) 100 MG capsule   Oral   Take 1 capsule (100 mg total) by  mouth every 12 (twelve) hours.   60 capsule   0   . famotidine (PEPCID) 20 MG tablet   Oral   Take 1 tablet (20 mg total) by mouth 2 (two) times daily.   60 tablet   1   . ferrous sulfate 325 (65 FE) MG tablet   Oral   Take 1 tablet (325 mg total) by mouth 2 (two) times daily with a meal.   60 tablet   0   . fluticasone (FLONASE) 50 MCG/ACT nasal spray   Nasal   Place 2 sprays into the nose daily.   16 g   1   . guaiFENesin (MUCINEX) 600 MG 12 hr tablet   Oral   Take 1,200 mg by mouth 2 (two) times daily. For congestion.         Marland Kitchen levalbuterol (XOPENEX) 0.63 MG/3ML nebulizer solution      Every 6 hours and every 3 hours as needed for SOB/wheezing   240 mL   2   . lidocaine (LIDODERM) 5 %   Transdermal   Place 1 patch onto the skin  daily. Remove & Discard patch within 12 hours or as directed by MD         . metoprolol tartrate (LOPRESSOR) 50 MG tablet   Oral   Take 1 tablet (50 mg total) by mouth 2 (two) times daily.   30 tablet   0   . Multiple Vitamins-Minerals (MULTIVITAMIN WITH MINERALS) tablet   Oral   Take 1 tablet by mouth every morning.          Marland Kitchen omeprazole (PRILOSEC) 40 MG capsule      Take 1 capsule (40 mg total) by mouth 2 times daily.         . pregabalin (LYRICA) 200 MG capsule   Oral   Take 200 mg by mouth 2 (two) times daily.         . promethazine (PHENERGAN) 25 MG tablet      Take 1 tablet (25 mg total) by mouth every 6 (six) hours as needed for Nausea.         . sodium chloride (OCEAN) 0.65 % SOLN nasal spray   Nasal   Place 2 sprays into the nose 4 (four) times daily.         Marland Kitchen amoxicillin-clavulanate (AUGMENTIN) 500-125 MG per tablet   Oral   Take 1 tablet (500 mg total) by mouth every 8 (eight) hours.   21 tablet   0   . oxyCODONE-acetaminophen (PERCOCET/ROXICET) 5-325 MG per tablet   Oral   Take 1-2 tablets by mouth every 4 (four) hours as needed for severe pain.   6 tablet   0    BP 142/101  Pulse 92  Temp(Src) 98.4 F (36.9 C) (Oral)  Resp 18  SpO2 100% Physical Exam  Nursing note and vitals reviewed. Constitutional: He is oriented to person, place, and time. He appears well-developed and well-nourished.  HENT:  Head: Normocephalic and atraumatic.  Eyes: EOM are normal.  Neck: Normal range of motion.  Cardiovascular: Normal rate.   Pulmonary/Chest: Effort normal.  Abdominal: Soft. There is no tenderness.  Genitourinary:  Moderate to sever tenderness upon DRE, no frank red blood or discharge with exam.  Musculoskeletal: Normal range of motion. He exhibits tenderness.  Tenderness along gluteal cleft.  Neurological: He is alert and oriented to person, place, and time.  Skin: Skin is warm and dry. There is erythema.  0.5x4cm mass with erythema,  and  tenderness between gluteal cleft.  No active drainage   Psychiatric: He has a normal mood and affect. His behavior is normal.    ED Course  Procedures (including critical care time) DIAGNOSTIC STUDIES: Oxygen Saturation is 100% on [place], normal by my interpretation.    COORDINATION OF CARE: 3:27 PM Discussed course of care with pt which includes Percocet.  Pt understands and agrees.  Labs Review Labs Reviewed - No data to display Imaging Review No results found.  EKG Interpretation   None       MDM   1. Gluteal abscess    Pt c/o mass on buttock that is tender.  Hx of hemorrhoids but pt denies hx of abscesses.  Area of mass and tenderness: hemorrhoid vs abscess.  Discussed pt with Dr. Romeo Apple who also examined pt.  Difficult to determine origin of mass.  Consulted general surgery who discussed tx plan with pt, pt is to f/u in the office with Dr. Biagio Quint at 2:45pm tomorrow, Dec. 5th.  Rx: augmentin and percocet. Pt verbalized understanding and agreement with tx plan.  I personally performed the services described in this documentation, which was scribed in my presence. The recorded information has been reviewed and is accurate.    Junius Finner, PA-C 12/15/12 1109

## 2012-12-15 ENCOUNTER — Encounter: Payer: Self-pay | Admitting: General Surgery

## 2012-12-15 ENCOUNTER — Encounter (INDEPENDENT_AMBULATORY_CARE_PROVIDER_SITE_OTHER): Payer: 59 | Admitting: General Surgery

## 2012-12-15 NOTE — Progress Notes (Unsigned)
Patient ID: [name], male   DOB: 10-Mar-1965, 47 y.o.   MRN: [id number]  [name] is a 47 y.o. male who comes to the Emergency Department complaining of cyst to the left intergluteal cleft that presented yesterday. The site is painful with palpation and sitting. He denies fluid drainage. Pt has tried Preparation X, pressure rash cream and Ibuprofen without any relief. Pt denies fever, nausea and vomiting. He denies having a previous hx of rashes, abscesses and cyst. Pt has allergies to Lincomycin.He has a past medical hx of HTN, sarcoidosis, chronic respiratory failure, bronchitis, and pneumonia. He does no smoke tobacco but does drink alcohol socially.  Pt says he has had some discomfort for about a week that has become progressive.  Past Medical History  Diagnosis Date  . Hypertension   . Sarcoidosis On home O2 and predinisone   . OSA on CPAP   . Chronic respiratory failure     home oxygen PRN  . Bronchitis   . Pneumonia    Past Surgical History  Procedure Laterality Date  . Rotator cuff repair    . Arm surgery     . Video assisted thoracoscopy (vats)/thorocotomy Left 07/17/2012    Procedure: VIDEO ASSISTED THORACOSCOPY (VATS)/BLEB Stapling;  Surgeon: Alleen Borne, MD;  Location: [facility] OR;  Service: Thoracic;  Laterality: Left;   Prior to Admission medications   Medication Sig Start Date End Date Taking? Authorizing Provider  albuterol (PROVENTIL) (2.5 MG/3ML) 0.083% nebulizer solution 1 vial in nebulizer every 4 hours as needed 06/08/12   Historical Provider, MD  amoxicillin-clavulanate (AUGMENTIN) 500-125 MG per tablet Take 1 tablet (500 mg total) by mouth every 8 (eight) hours. 12/14/12   Junius Finner, PA-C  budesonide (PULMICORT) 0.25 MG/2ML nebulizer solution Take 2 mLs (0.25 mg total) by nebulization 2 (two) times daily. 08/04/12   Simonne Martinet, NP  calcium-vitamin D (OSCAL WITH D) 500-200 MG-UNIT per tablet Take 1 tablet by mouth every morning.    Historical Provider, MD   cholecalciferol (VITAMIN D) 1000 UNITS tablet Take 1,000 Units by mouth every morning.     Historical Provider, MD  cyclobenzaprine (FLEXERIL) 10 MG tablet Take 10 mg by mouth 3 (three) times daily as needed. Muscle spasms.    Historical Provider, MD  diphenhydrAMINE (BENADRYL) 25 MG tablet Take 25 mg by mouth every 6 (six) hours as needed. Allergies    Historical Provider, MD  docusate sodium (COLACE) 100 MG capsule Take 1 capsule (100 mg total) by mouth every 12 (twelve) hours. 02/28/12   Roxy Horseman, PA-C  famotidine (PEPCID) 20 MG tablet Take 1 tablet (20 mg total) by mouth 2 (two) times daily. 03/08/12   Renae Fickle, MD  ferrous sulfate 325 (65 FE) MG tablet Take 1 tablet (325 mg total) by mouth 2 (two) times daily with a meal. 06/18/12   Leroy Sea, MD  fluticasone (FLONASE) 50 MCG/ACT nasal spray Place 2 sprays into the nose daily. 03/08/12   Renae Fickle, MD  guaiFENesin (MUCINEX) 600 MG 12 hr tablet Take 1,200 mg by mouth 2 (two) times daily. For congestion.    Historical Provider, MD  levalbuterol Pauline Aus) 0.63 MG/3ML nebulizer solution Every 6 hours and every 3 hours as needed for SOB/wheezing 07/27/12   Bernadene Person, NP  lidocaine (LIDODERM) 5 % Place 1 patch onto the skin daily. Remove & Discard patch within 12 hours or as directed by MD    Historical Provider, MD  metoprolol tartrate (LOPRESSOR) 50 MG tablet  Take 1 tablet (50 mg total) by mouth 2 (two) times daily. 07/27/12   Bernadene Person, NP  Multiple Vitamins-Minerals (MULTIVITAMIN WITH MINERALS) tablet Take 1 tablet by mouth every morning.     Historical Provider, MD  omeprazole (PRILOSEC) 40 MG capsule Take 1 capsule (40 mg total) by mouth 2 times daily. 11/02/11   Historical Provider, MD  oxyCODONE-acetaminophen (PERCOCET/ROXICET) 5-325 MG per tablet Take 1-2 tablets by mouth every 4 (four) hours as needed for severe pain. 12/14/12   Junius Finner, PA-C  pregabalin (LYRICA) 200 MG capsule Take 200 mg by  mouth 2 (two) times daily.    Historical Provider, MD  promethazine (PHENERGAN) 25 MG tablet Take 1 tablet (25 mg total) by mouth every 6 (six) hours as needed for Nausea. 11/02/11   Historical Provider, MD  sodium chloride (OCEAN) 0.65 % SOLN nasal spray Place 2 sprays into the nose 4 (four) times daily. 03/08/12   Renae Fickle, MD   Allergies  Allergen Reactions  . Clindamycin/Lincomycin Swelling    PE:  Alert Male on O2 in exam room Rectal exam:  Shows a cyst below the coccyx in the gluetal cleft, raised abut 3 cm. No drainage, very tender.   Plan:  I gave him the option of waiting on my PM surgeron to arrive or be seen tomorrow in the Urgent office.  He choose the Urgent office tomorrow.  He has a 3:15 PM appointment.  The ER staff will send home on Augmentin and pain medication.  I instructed him to do Sitz bath at home multiple times, and to keep it clean and dry.

## 2012-12-15 NOTE — ED Provider Notes (Signed)
Medical screening examination/treatment/procedure(s) were conducted as a shared visit with non-physician practitioner(s) and myself.  I personally evaluated the patient during the encounter.  EKG Interpretation   None       I interviewed and examined the patient. Lungs are CTAB. Cardiac exam wnl. Abdomen soft.  Question of abscess vs hemorrhoid on rectal exam. Normal rectal exam w/out significant ttp in the rectal vault or masses palpated. [facility]SU saw in ED, will get f/u tomorrow.   Junius Argyle, MD 12/15/12 [id number]

## 2012-12-18 ENCOUNTER — Encounter (INDEPENDENT_AMBULATORY_CARE_PROVIDER_SITE_OTHER): Payer: Self-pay | Admitting: General Surgery

## 2012-12-18 ENCOUNTER — Encounter (INDEPENDENT_AMBULATORY_CARE_PROVIDER_SITE_OTHER): Payer: Self-pay

## 2012-12-18 ENCOUNTER — Ambulatory Visit (INDEPENDENT_AMBULATORY_CARE_PROVIDER_SITE_OTHER): Payer: 59 | Admitting: General Surgery

## 2012-12-18 VITALS — BP 138/60 | HR 84 | Temp 96.9°F | Resp 14 | Ht 67.5 in | Wt 175.2 lb

## 2012-12-18 DIAGNOSIS — K611 Rectal abscess: Secondary | ICD-10-CM

## 2012-12-18 DIAGNOSIS — K612 Anorectal abscess: Secondary | ICD-10-CM

## 2012-12-18 MED ORDER — OXYCODONE-ACETAMINOPHEN 5-325 MG PO TABS: 1.0000 | ORAL_TABLET | ORAL | Status: DC | PRN

## 2012-12-18 NOTE — Progress Notes (Signed)
The patient comes in with a draining left side perirectal abscess. It has been spontaneously draining and based on pictures shown to me by the patient's wife has decreased to less than half its original size. He is otherwise stricture with severe pulmonary sarcoidosis for which he has been on steroids and is on oxygen at home. He has been evaluated for possible lung transplant.  On examination he has an open left perirectal abscess which is draining but not a large amount of pus. On digital examination I can feel some induration along the track but I cannot express any pus from the rectum and/or the perirectal abscess.  I think that current treatment should suffice. He should continue to do sitz baths and these twice a day. He should continue his antibiotics until they're finished. He states that he has approximately 12 pills left. I will see him back here on December 30.

## 2012-12-18 NOTE — Addendum Note (Signed)
Addended by: Frederik Schmidt on: 12/18/2012 05:27 PM   Modules accepted: Orders

## 2012-12-19 ENCOUNTER — Encounter (HOSPITAL_COMMUNITY): Payer: 59

## 2012-12-21 ENCOUNTER — Encounter (HOSPITAL_COMMUNITY): Payer: 59

## 2012-12-26 ENCOUNTER — Encounter (HOSPITAL_COMMUNITY): Payer: 59

## 2012-12-28 ENCOUNTER — Encounter (HOSPITAL_COMMUNITY): Payer: 59

## 2013-01-02 ENCOUNTER — Encounter (HOSPITAL_COMMUNITY): Payer: 59

## 2013-01-02 ENCOUNTER — Telehealth: Payer: Self-pay | Admitting: Emergency Medicine

## 2013-01-02 MED ORDER — METOPROLOL TARTRATE 50 MG PO TABS: 50.0000 mg | ORAL_TABLET | Freq: Two times a day (BID) | ORAL | Status: DC

## 2013-01-02 NOTE — Telephone Encounter (Signed)
Received faxed refill request from [facility] for pt's Metoprolol Tart 50mg  Pt's last ov w/ RB 9.5.14:  Patient Instructions     Please continue your current medications  We will make your lung transplant referral to [facility]  We will work on getting a portable oxygen concentrator  You may restart her CPAP every night  Flu shot today  Follow with Dr Delton Coombes in 3 months or sooner if you have any problems.   Metoprolol tart 50mg  last refilled at 7.25.14 hosp discharge by Pulmonary Pt does not have a PCP on file RB is out of the office until next week Will refill x1 and send note to RB to see if okay to continue refilling this medication Please advise, thank you.

## 2013-01-04 ENCOUNTER — Encounter (HOSPITAL_COMMUNITY): Payer: 59

## 2013-01-08 NOTE — Telephone Encounter (Signed)
OK With me to refill this until he obtains a PCP

## 2013-01-09 ENCOUNTER — Telehealth: Payer: Self-pay | Admitting: Emergency Medicine

## 2013-01-09 ENCOUNTER — Encounter (INDEPENDENT_AMBULATORY_CARE_PROVIDER_SITE_OTHER): Payer: 59 | Admitting: General Surgery

## 2013-01-09 ENCOUNTER — Encounter (HOSPITAL_COMMUNITY): Payer: 59

## 2013-01-09 MED ORDER — PREDNISONE 10 MG PO TABS: ORAL_TABLET | ORAL | Status: DC

## 2013-01-09 NOTE — Telephone Encounter (Signed)
Pt c/o increased cough, wheezing and fatigue x 2-3 days. Denies fever or low sats. Using Robitussin and Sinus meds and Mucinex OTC Allergies  Allergen Reactions  . Clindamycin/Lincomycin Swelling  Pt refused appt today.  [facility]  Dr Delton Coombes please advise. Thanks.

## 2013-01-09 NOTE — Telephone Encounter (Signed)
He needs to be seen. In meantime order Pred: Take 40mg  daily for 3 days, then 30mg  daily for 3 days, then 20mg  daily for 3 days, then 10mg  daily for 3 days, then stop

## 2013-01-09 NOTE — Telephone Encounter (Signed)
Called and spoke with [name]. Aware of recs. Appt scheduled with [facility] on Monday and RX has been sent. Nothing further needed

## 2013-01-09 NOTE — Telephone Encounter (Signed)
lmomtcb x1 for [employer]

## 2013-01-11 ENCOUNTER — Encounter (HOSPITAL_COMMUNITY): Payer: 59

## 2013-01-11 DIAGNOSIS — D869 Sarcoidosis, unspecified: Secondary | ICD-10-CM | POA: Insufficient documentation

## 2013-01-11 DIAGNOSIS — I4892 Unspecified atrial flutter: Secondary | ICD-10-CM | POA: Insufficient documentation

## 2013-01-11 DIAGNOSIS — Z5189 Encounter for other specified aftercare: Secondary | ICD-10-CM | POA: Insufficient documentation

## 2013-01-11 DIAGNOSIS — J9610 Chronic respiratory failure, unspecified whether with hypoxia or hypercapnia: Secondary | ICD-10-CM | POA: Insufficient documentation

## 2013-01-15 ENCOUNTER — Ambulatory Visit: Payer: 59 | Admitting: Internal Medicine

## 2013-01-16 ENCOUNTER — Encounter (HOSPITAL_COMMUNITY)
Admission: RE | Admit: 2013-01-16 | Discharge: 2013-01-16 | Disposition: A | Discharge status: Home / Self Care | Payer: 59 | Source: Ambulatory Visit | Attending: Emergency Medicine | Admitting: Emergency Medicine

## 2013-01-18 ENCOUNTER — Ambulatory Visit: Payer: 59 | Admitting: Internal Medicine

## 2013-01-18 ENCOUNTER — Encounter: Payer: Self-pay | Admitting: Emergency Medicine

## 2013-01-18 ENCOUNTER — Encounter (HOSPITAL_COMMUNITY): Payer: 59

## 2013-01-18 ENCOUNTER — Ambulatory Visit (INDEPENDENT_AMBULATORY_CARE_PROVIDER_SITE_OTHER): Payer: 59 | Admitting: Emergency Medicine

## 2013-01-18 VITALS — BP 140/94 | HR 73 | Ht 67.5 in | Wt 186.6 lb

## 2013-01-18 DIAGNOSIS — D869 Sarcoidosis, unspecified: Secondary | ICD-10-CM

## 2013-01-18 DIAGNOSIS — G4733 Obstructive sleep apnea (adult) (pediatric): Secondary | ICD-10-CM

## 2013-01-18 MED ORDER — HYDROCODONE-ACETAMINOPHEN 7.5-325 MG PO TABS: 1.0000 | ORAL_TABLET | Freq: Four times a day (QID) | ORAL | Status: DC | PRN

## 2013-01-18 NOTE — Assessment & Plan Note (Addendum)
Recent AE. Improved on prednisone. He was rejected by [facility], failed the drug test  > cocaine. Today he is asking me if he can get a portable concentrator - was unable to do so through [employer] so wants to change DME's to [facility]. He is also asking me for pain medication for neuropathic chest wall pain  - continue same BD's - one time script for norco - transplant referral will not go through - rov 3

## 2013-01-18 NOTE — Patient Instructions (Signed)
Decrease your flonase to 1 spray each side daily as needed  Continue your pulmicort and albuterol nebulizers as you have been taking them  Use norco as directed for pain. We will not be able to refill this prescription.  We will change your Oxygen company so you can get a poratble concentrator  Follow with Dr Delton CoombesByrum in 3 months or sooner if you have any problems.

## 2013-01-18 NOTE — Progress Notes (Signed)
Subjective:    Patient ID: [name], male    DOB: Aug 22, 1965, 48 y.o.   MRN: [id number]  HPI 48 yo man, never smoker, hx sarcoidosis dx by transbronchial bx's in 2002 and again in 2011 or 2012, also OSA on CPAP. He has occasional flares that sound asthmatic in nature, gets treated with prednisone. He flares typically a few times a year, but he has also gone for over a year without an exacerbation. He has never been on everyday meds, has been on pred with exacerbations - longest he was on it was 90 days. He has also been on a steroid-sparing agent before (?MTX), but it was stopped. He has a large cavity in R lung, scattered infiltrates. He has been on Advair before, Spiriva before, not currently. Has albuterol available prn, uses almost every day. Last PFT were last year at [facility].   ROV 01/06/12 -- sarcoidosis, R lung cavitary disease. Was recently hospitalized for an acute exacerbation. D/c after pred taper. Last CT scan was 4/13. He is using nebs 2 -3 x a day.   04/06/12  Post Hospital follow up  Patient presents for a post hospital followup. Patient was recently admitted with acute bronchiectasis, exacerbation, complicated by underlying sarcoidosis. Patient has had several recent hospitalizations and emergency room visits.   He was treated with aggressive pulmonary hygiene, antibiotics, and a steroid taper. Since discharge pt reports breathing is doing well overall but still wheezing, SOB, head congestion and prod cough with clear  Mucus .  Patient denies any hemoptysis, orthopnea, PND, or leg swelling. During admission. Patient has had cocaine, positive urine drug screen in his past. He did receive a social work consult. Patient denies any drug use at today's visit.  He was discharged on prednisone to recommend to hold at 20 mg however, patient misunderstood instructions and has stopped all steroids.  ROV 05/18/12 -- sarcoidosis, bronchiectasis and R cavitary disease. Regular f/u visit. Has  been doing well, some intermittent SOB. Taking allegra and benadryl. He is on QVAR daily, off prednisone. Uses SABA rarely. He is reliable w CPAP.   ROV 06/27/12 -- sarcoidosis, bronchiectasis and R cavitary disease. Also OSA, HTN w diastolic dysfxn.  Was admitted for flare obstructive lung disease/sarcoidosis 6/6 - 6/8. He underwent repeat CT scan chest today >> stable bullous and parenchymal changes compared with 02/2012. He feels back to baseline clinically. Having some cough, clear phlegm. On flonase, allegra and benadryl.   post hospital followup. Patient returns for a post hospital followup. Patient had a complicated hospital course, which he was admitted June 30, and discharged on 08/04/2012. Patient was admitted with acute respiratory failure with a left tension pneumothorax requiring mechanical ventilation. Patient had a persistent left pneumothorax with subcutaneous emphysema around his chest tube. He was seen by thoracic surgery and underwent a left video-assisted thoracoscopy on July 7 with stapling of apical blebs and mechanical thoracentesis. Pt cont w/ dyspnea/fevers.  CT scan on July 20 showed a, moderate left pleural effusion with loculation anteriorly/? HCAP >Underwent thoracentesis was done with 90 cc removed. Analysis was consistent with an exudative effusion and his culture data was negative. He was treated with abx and discharged on Levaquin to complete a ten-day course. And steroid taper-which he has a  few days left.  He was seen by thoracic surgery yesterday.chest x-ray showed a mild right apical pneumothorax. That is unchanged from previous exam.   Since discharge he is feeling better but still very weak.  Feels  breathing  is doing well overall.  does report some pain at the incision site and in the left lung.  finished levaquin yesterday, still taking prednisone at 3tabs daily. No hemoptysis , orthopnea or edema.   ROV 09/15/12 - sarcoidosis, bronchiectasis and R cavitary disease,  complicated hospitalization for PTX as above. Hasn't been using his CPAP in order to avoid positive pressure following his pneumothorax. He was seen 8/21 and treated with levofloxacin and prednisone for bronchitis versus pneumonia. He needs his portable concentrator, [employer] doesn't provide. [facility]. Uses albuterol prn, about once a day. Pulmicort bid.   ROV 01/17/13 -- sarcoidosis, bronchiectasis and R cavitary disease, OSA back on CPAP. Hx PTX on L. He began to have an exacerbation end of Dec > treated with pred taper and now improved. He is using albuterol nebs prn, pulmicort nebs bid.     Objective:   Physical Exam  Filed Vitals:   01/18/13 1639  BP: 140/94  Pulse: 73  Height: 5' 7.5" (1.715 m)  Weight: 186 lb 9.6 oz (84.641 kg)  SpO2: 94%    Gen: Pleasant, well-nourished, in no distress,  normal affect  ENT: No lesions,  mouth clear,  oropharynx clear, no postnasal drip  Neck: No JVD, no TMG, no carotid bruits  Lungs:  Very distant, Diminshed BS in bases, no wheezes  Cardiovascular: RRR, heart sounds normal, no murmur or gallops, no peripheral edema  Musculoskeletal: No deformities, no cyanosis or clubbing  Neuro: alert, non focal  Skin: Warm, no lesions or rashes  CXR 08/09/12 Mild right apical pneumothorax is noted which is unchanged compared . Stable bilateral lung opacities compared to prior exam consistent with history of sarcoidosis.   CT chest 07/30/12 >Increase in a small to moderate left pleural effusion since  most recent examination. Component along the anterior chest wall  has a lenticular configuration and may be loculated.  2. Perihilar opacities and traction bronchiectasis consistent with  history of sarcoidosis. Extent of opacity in the superior segment  left lower lobe appears somewhat increased since the 06/27/2012  study but not notably changed compared 02/14/12. This could be due  to increased  atelectasis or pneumonia.  3. Negative for pneumothorax. Extensive subcutaneous air about  the chest noted.  CT chest 06/27/12 --  Comparison: CTA chest dated 02/14/2012  Findings: 10.1 x 7.8 cm thin-walled cavitary/bullous lesion in the  posterior right upper lobe (series 4/image 20), unchanged.  Confluent perihilar opacities with traction bronchiectasis and  architectural distortion, related to known sarcoidosis. Associated  peribronchovascular nodularity bilaterally.  On inspiratory / expiratory imaging, there is no evidence of air  trapping.  No superimposed opacities suspicious for pneumonia. No pleural  effusion or pneumothorax.  Heart is normal in size. No pericardial effusion.  Partially calcified mediastinal lymphadenopathy, including:  --1.3 cm short-axis right paratracheal node (series 2/image 17)  --1.0 cm short-axis AP window node (series 2/image 18)  --1.9 cm short-axis subcarinal node (series 2/image 27)  Associated hilar lymphadenopathy is suspected but difficult to  discretely measure in the absence of intravenous contrast  administration. The overall appearance is unchanged.  Visualized upper abdomen is unremarkable.  Visualized osseous structures are within normal limits.       Assessment & Plan:   Sarcoid Recent AE. Improved on prednisone. He was rejected by [facility], failed the drug test  > cocaine. Today he is asking me if he can get a portable concentrator - was unable  to do so through [place] so wants to change DME's to [facility]. He is also asking me for pain medication for neuropathic chest wall pain  - continue same BD's - one time script for norco - transplant referral will not go through - rov 3

## 2013-01-23 ENCOUNTER — Encounter (HOSPITAL_COMMUNITY)
Admission: RE | Admit: 2013-01-23 | Discharge: 2013-01-23 | Disposition: A | Discharge status: Home / Self Care | Payer: 59 | Source: Ambulatory Visit | Attending: Emergency Medicine | Admitting: Emergency Medicine

## 2013-01-25 ENCOUNTER — Encounter (HOSPITAL_COMMUNITY): Payer: 59

## 2013-01-30 ENCOUNTER — Encounter (INDEPENDENT_AMBULATORY_CARE_PROVIDER_SITE_OTHER): Payer: 59 | Admitting: General Surgery

## 2013-02-01 ENCOUNTER — Encounter (HOSPITAL_COMMUNITY)
Admission: RE | Admit: 2013-02-01 | Discharge: 2013-02-01 | Disposition: A | Discharge status: Home / Self Care | Payer: 59 | Source: Ambulatory Visit | Attending: Emergency Medicine | Admitting: Emergency Medicine

## 2013-02-04 ENCOUNTER — Emergency Department (HOSPITAL_COMMUNITY)
Admission: EM | Admit: 2013-02-04 | Discharge: 2013-02-04 | Disposition: A | Discharge status: Home / Self Care | Payer: 59 | Attending: Emergency Medicine | Admitting: Emergency Medicine

## 2013-02-04 ENCOUNTER — Encounter (HOSPITAL_COMMUNITY): Payer: Self-pay | Admitting: Emergency Medicine

## 2013-02-04 DIAGNOSIS — G4733 Obstructive sleep apnea (adult) (pediatric): Secondary | ICD-10-CM | POA: Insufficient documentation

## 2013-02-04 DIAGNOSIS — T171XXA Foreign body in nostril, initial encounter: Secondary | ICD-10-CM | POA: Insufficient documentation

## 2013-02-04 DIAGNOSIS — Z79899 Other long term (current) drug therapy: Secondary | ICD-10-CM | POA: Insufficient documentation

## 2013-02-04 DIAGNOSIS — J4489 Other specified chronic obstructive pulmonary disease: Secondary | ICD-10-CM | POA: Insufficient documentation

## 2013-02-04 DIAGNOSIS — Z8619 Personal history of other infectious and parasitic diseases: Secondary | ICD-10-CM | POA: Insufficient documentation

## 2013-02-04 DIAGNOSIS — J9610 Chronic respiratory failure, unspecified whether with hypoxia or hypercapnia: Secondary | ICD-10-CM | POA: Insufficient documentation

## 2013-02-04 DIAGNOSIS — Y929 Unspecified place or not applicable: Secondary | ICD-10-CM | POA: Insufficient documentation

## 2013-02-04 DIAGNOSIS — I10 Essential (primary) hypertension: Secondary | ICD-10-CM | POA: Insufficient documentation

## 2013-02-04 DIAGNOSIS — IMO0002 Reserved for concepts with insufficient information to code with codable children: Secondary | ICD-10-CM | POA: Insufficient documentation

## 2013-02-04 DIAGNOSIS — J449 Chronic obstructive pulmonary disease, unspecified: Secondary | ICD-10-CM | POA: Insufficient documentation

## 2013-02-04 DIAGNOSIS — Y9389 Activity, other specified: Secondary | ICD-10-CM | POA: Insufficient documentation

## 2013-02-04 DIAGNOSIS — Z8701 Personal history of pneumonia (recurrent): Secondary | ICD-10-CM | POA: Insufficient documentation

## 2013-02-04 NOTE — ED Provider Notes (Signed)
CSN: [id number]     Arrival date & time 02/04/13  0757 History   First MD Initiated Contact with Patient 02/04/13 0809     Chief Complaint  Patient presents with  . Ear Plug stuck in his nose    (Consider location/radiation/quality/duration/timing/severity/associated sxs/prior Treatment) The history is provided by the patient and the spouse.   patient here with left nares foreign body times one hour. Patient accidentally stuck in ear plug into his left nose when he was asleep. He attempted to blow his nose but was unsuccessful. Notes sinus pressure. Symptoms are persistent. Patient has a history of sarcoid doses and is on chronic oxygen by nasal cannula  Past Medical History  Diagnosis Date  . Hypertension   . Sarcoidosis   . OSA on CPAP   . Chronic respiratory failure     home oxygen PRN  . Bronchitis   . Pneumonia   . COPD (chronic obstructive pulmonary disease)   . Neuromuscular disorder    Past Surgical History  Procedure Laterality Date  . Rotator cuff repair    . Arm surgery     . Video assisted thoracoscopy (vats)/thorocotomy Left 07/17/2012    Procedure: VIDEO ASSISTED THORACOSCOPY (VATS)/BLEB Stapling;  Surgeon: Alleen Borne, MD;  Location: [facility] OR;  Service: Thoracic;  Laterality: Left;   Family History  Problem Relation Age of Onset  . Diabetes Father    History  Substance Use Topics  . Smoking status: Never Smoker   . Smokeless tobacco: Never Used  . Alcohol Use: Yes     Comment: social    Review of Systems  All other systems reviewed and are negative.    Allergies  Clindamycin/lincomycin  Home Medications   Current Outpatient Rx  Name  Route  Sig  Dispense  Refill  . albuterol (PROVENTIL) (2.5 MG/3ML) 0.083% nebulizer solution      1 vial in nebulizer every 4 hours as needed         . budesonide (PULMICORT) 0.25 MG/2ML nebulizer solution   Nebulization   Take 2 mLs (0.25 mg total) by nebulization 2 (two) times daily.   60 mL   12   .  calcium-vitamin D (OSCAL WITH D) 500-200 MG-UNIT per tablet   Oral   Take 1 tablet by mouth every morning.         . cholecalciferol (VITAMIN D) 1000 UNITS tablet   Oral   Take 1,000 Units by mouth every morning.          . cyclobenzaprine (FLEXERIL) 10 MG tablet   Oral   Take 10 mg by mouth 3 (three) times daily as needed. Muscle spasms.         . diphenhydrAMINE (BENADRYL) 25 MG tablet   Oral   Take 25 mg by mouth every 6 (six) hours as needed. Allergies         . docusate sodium (COLACE) 100 MG capsule   Oral   Take 1 capsule (100 mg total) by mouth every 12 (twelve) hours.   60 capsule   0   . famotidine (PEPCID) 20 MG tablet   Oral   Take 1 tablet (20 mg total) by mouth 2 (two) times daily.   60 tablet   1   . ferrous sulfate 325 (65 FE) MG tablet   Oral   Take 1 tablet (325 mg total) by mouth 2 (two) times daily with a meal.   60 tablet   0   . fluticasone (FLONASE)  50 MCG/ACT nasal spray   Nasal   Place 2 sprays into the nose daily.   16 g   1   . guaiFENesin (MUCINEX) 600 MG 12 hr tablet   Oral   Take 1,200 mg by mouth 2 (two) times daily. For congestion.         Marland Kitchen HYDROcodone-acetaminophen (NORCO) 7.5-325 MG per tablet   Oral   Take 1 tablet by mouth every 6 (six) hours as needed for moderate pain.   15 tablet   0   . lidocaine (LIDODERM) 5 %   Transdermal   Place 1 patch onto the skin daily. Remove & Discard patch within 12 hours or as directed by MD         . metoprolol (LOPRESSOR) 50 MG tablet   Oral   Take 1 tablet (50 mg total) by mouth 2 (two) times daily.   60 tablet   0   . Multiple Vitamins-Minerals (MULTIVITAMIN WITH MINERALS) tablet   Oral   Take 1 tablet by mouth every morning.          Marland Kitchen omeprazole (PRILOSEC) 40 MG capsule      Take 1 capsule (40 mg total) by mouth 2 times daily.         Marland Kitchen oxyCODONE-acetaminophen (PERCOCET/ROXICET) 5-325 MG per tablet   Oral   Take 1-2 tablets by mouth every 4 (four) hours as  needed for severe pain.   6 tablet   0   . pregabalin (LYRICA) 200 MG capsule   Oral   Take 200 mg by mouth 2 (two) times daily.         . promethazine (PHENERGAN) 25 MG tablet      Take 1 tablet (25 mg total) by mouth every 6 (six) hours as needed for Nausea.         . sodium chloride (OCEAN) 0.65 % SOLN nasal spray   Nasal   Place 2 sprays into the nose 4 (four) times daily.          BP 124/74  Pulse 114  Temp(Src) 98.2 F (36.8 C) (Oral)  Resp 14  SpO2 100% Physical Exam  Nursing note and vitals reviewed. Constitutional: He is oriented to person, place, and time. He appears well-developed and well-nourished.  Non-toxic appearance. No distress.  HENT:  Head: Normocephalic and atraumatic.  Nose:    Eyes: Conjunctivae, EOM and lids are normal. Pupils are equal, round, and reactive to light.  Neck: Normal range of motion. Neck supple. No tracheal deviation present. No mass present.  Cardiovascular: Normal rate, regular rhythm and normal heart sounds.  Exam reveals no gallop.   No murmur heard. Pulmonary/Chest: Effort normal and breath sounds normal. No stridor. No respiratory distress. He has no decreased breath sounds. He has no wheezes. He has no rhonchi. He has no rales.  Abdominal: Soft. Normal appearance and bowel sounds are normal. He exhibits no distension. There is no tenderness. There is no rebound and no CVA tenderness.  Musculoskeletal: Normal range of motion. He exhibits no edema and no tenderness.  Neurological: He is alert and oriented to person, place, and time. He has normal strength. No cranial nerve deficit or sensory deficit. GCS eye subscore is 4. GCS verbal subscore is 5. GCS motor subscore is 6.  Skin: Skin is warm and dry. No abrasion and no rash noted.  Psychiatric: He has a normal mood and affect. His speech is normal and behavior is normal.    ED Course  FOREIGN BODY REMOVAL Date/Time: 02/04/2013 8:36 AM Performed by: Toy BakerALLEN, Parag Dorton  T Authorized by: Lorre NickALLEN, Makisha Marrin T Consent: Verbal consent obtained. Risks and benefits: risks, benefits and alternatives were discussed Time out: Immediately prior to procedure a "time out" was called to verify the correct patient, procedure, equipment, support staff and site/side marked as required. Body area: nose Patient restrained: no Patient cooperative: yes Localization method: nasal speculum and visualized Removal mechanism: forceps Complexity: simple 1 objects recovered. Objects recovered:  blue ear plug Post-procedure assessment: foreign body removed Patient tolerance: Patient tolerated the procedure well with no immediate complications.   (including critical care time) Labs Review Labs Reviewed - No data to display Imaging Review No results found.  EKG Interpretation   None       MDM   1. Nasal foreign body    Patient's foreign bodies are removed.    Toy BakerAnthony T Deidra Spease, MD 02/04/13 [phone]

## 2013-02-04 NOTE — Discharge Instructions (Signed)
Nasal Foreign Body °A nasal foreign body is any object inserted inside the nose. Small children often insert small objects in the nose such as beads, coins, and small toys. Older children and adults may also accidentally get an object stuck inside the nose. Having a foreign body in the nose can cause serious medical problems. It may cause trouble breathing. If the object is swallowed and obstructs the esophagus, it can cause difficulty swallowing. A nasal foreign body often causes bleeding of the nose. Depending on the type of object, irritation in the nose may also occur. This can be more serious with certain objects, such as button batteries, magnets, and wooden objects. A foreign body may also cause thick, yellowish, or bad smelling drainage from the nose, as well as pain in the nose and face. These problems can be signs of infection. Nasal foreign bodies require immediate evaluation by a medical professional.  °HOME CARE INSTRUCTIONS  °· Do not try to remove the object without getting medical advice. Trying to grab the object may push it deeper and make it more difficult to remove. °· Breathe through the mouth until you can see your caregiver. This helps prevent inhalation of the object. °· Keep small objects out of reach of young children. °· Tell your child not to put objects into his or her nose. Tell your child to get help from an adult right away if it happens again. °SEEK MEDICAL CARE IF:  °· There is any trouble breathing. °· There is sudden difficulty swallowing, increased drooling, or new chest pain. °· There is any bleeding from the nose. °· The nose continues to drain. An object may still be in the nose. °· A fever, earache, headache, pain in the cheeks or around the eyes, or yellow-green nasal discharge develops. These are signs of a possible sinus infection or ear infection from obstruction of the normal nasal airway. °MAKE SURE YOU: °· Understand these instructions. °· Will watch your  condition. °· Will get help right away if you are not doing well or get worse. °Document Released: 12/26/1999 Document Revised: 03/22/2011 Document Reviewed: 06/18/2010 °[employer]® Patient Information ©2014 [employer]. ° °

## 2013-02-04 NOTE — ED Notes (Signed)
Pt states that he was sleeping, trying to put an ear plug in his ear, but accidentally stuck it all the way up in his nose and "now he can't find it".

## 2013-02-06 ENCOUNTER — Encounter (HOSPITAL_COMMUNITY)
Admission: RE | Admit: 2013-02-06 | Discharge: 2013-02-06 | Disposition: A | Discharge status: Home / Self Care | Payer: 59 | Source: Ambulatory Visit | Attending: Emergency Medicine | Admitting: Emergency Medicine

## 2013-02-08 ENCOUNTER — Encounter (HOSPITAL_COMMUNITY): Payer: 59

## 2013-02-13 ENCOUNTER — Encounter (HOSPITAL_COMMUNITY)
Admission: RE | Admit: 2013-02-13 | Discharge: 2013-02-13 | Disposition: A | Discharge status: Home / Self Care | Payer: 59 | Source: Ambulatory Visit | Attending: Emergency Medicine | Admitting: Emergency Medicine

## 2013-02-13 DIAGNOSIS — D869 Sarcoidosis, unspecified: Secondary | ICD-10-CM | POA: Insufficient documentation

## 2013-02-13 DIAGNOSIS — Z5189 Encounter for other specified aftercare: Secondary | ICD-10-CM | POA: Insufficient documentation

## 2013-02-13 DIAGNOSIS — J9610 Chronic respiratory failure, unspecified whether with hypoxia or hypercapnia: Secondary | ICD-10-CM | POA: Insufficient documentation

## 2013-02-13 DIAGNOSIS — I4892 Unspecified atrial flutter: Secondary | ICD-10-CM | POA: Insufficient documentation

## 2013-02-15 ENCOUNTER — Encounter (HOSPITAL_COMMUNITY): Payer: 59

## 2013-02-20 ENCOUNTER — Encounter (HOSPITAL_COMMUNITY)
Admission: RE | Admit: 2013-02-20 | Discharge: 2013-02-20 | Disposition: A | Discharge status: Home / Self Care | Payer: 59 | Source: Ambulatory Visit | Attending: Emergency Medicine | Admitting: Emergency Medicine

## 2013-02-22 ENCOUNTER — Encounter (HOSPITAL_COMMUNITY): Payer: 59

## 2013-02-25 IMAGING — CR DG CHEST 2V
2 series · 2 of 2 positions shown · non-contrast
Comparison: 07/27/2011

CLINICAL DATA: Chest pain.  Short of breath.

CHEST - 2 VIEW

[w chest pa]
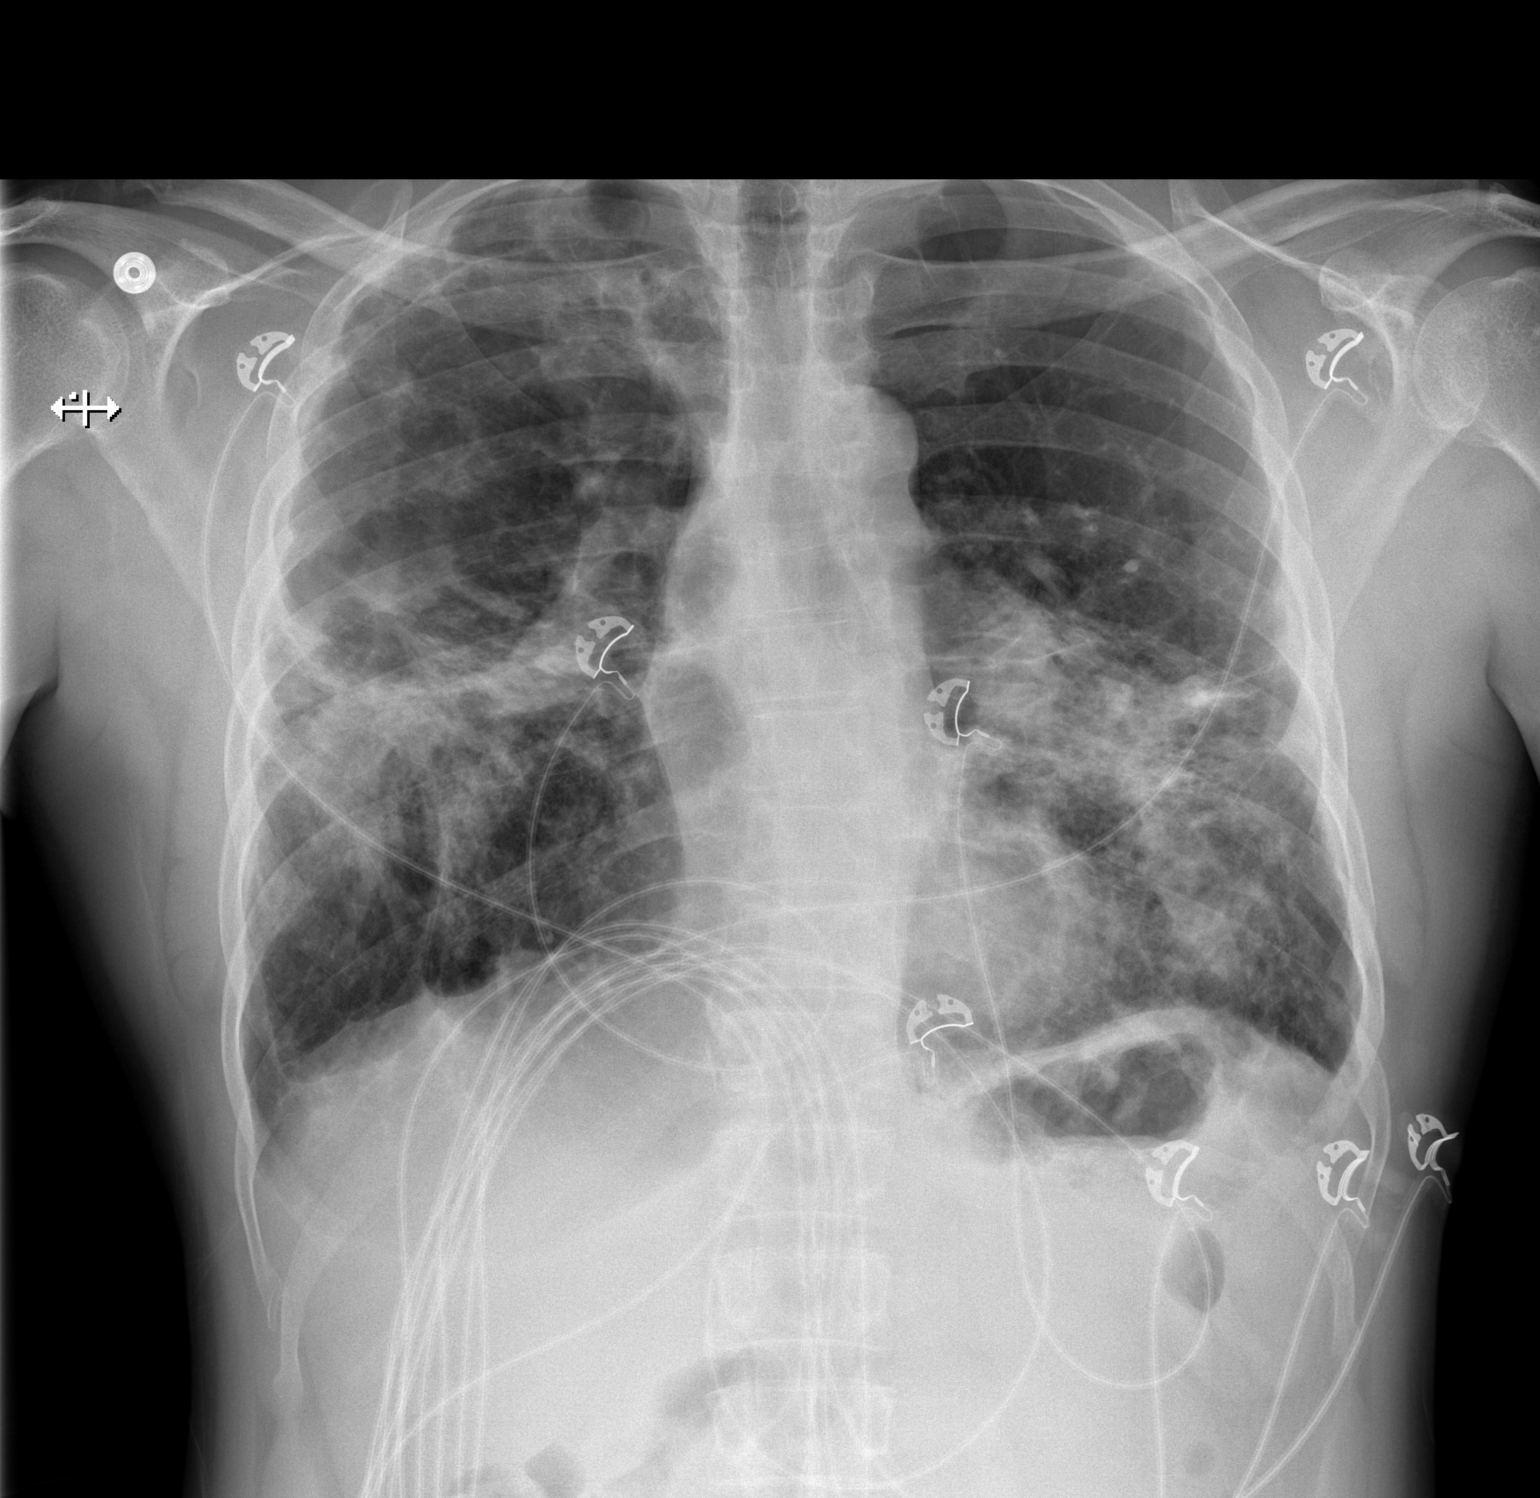

[w chest lat]
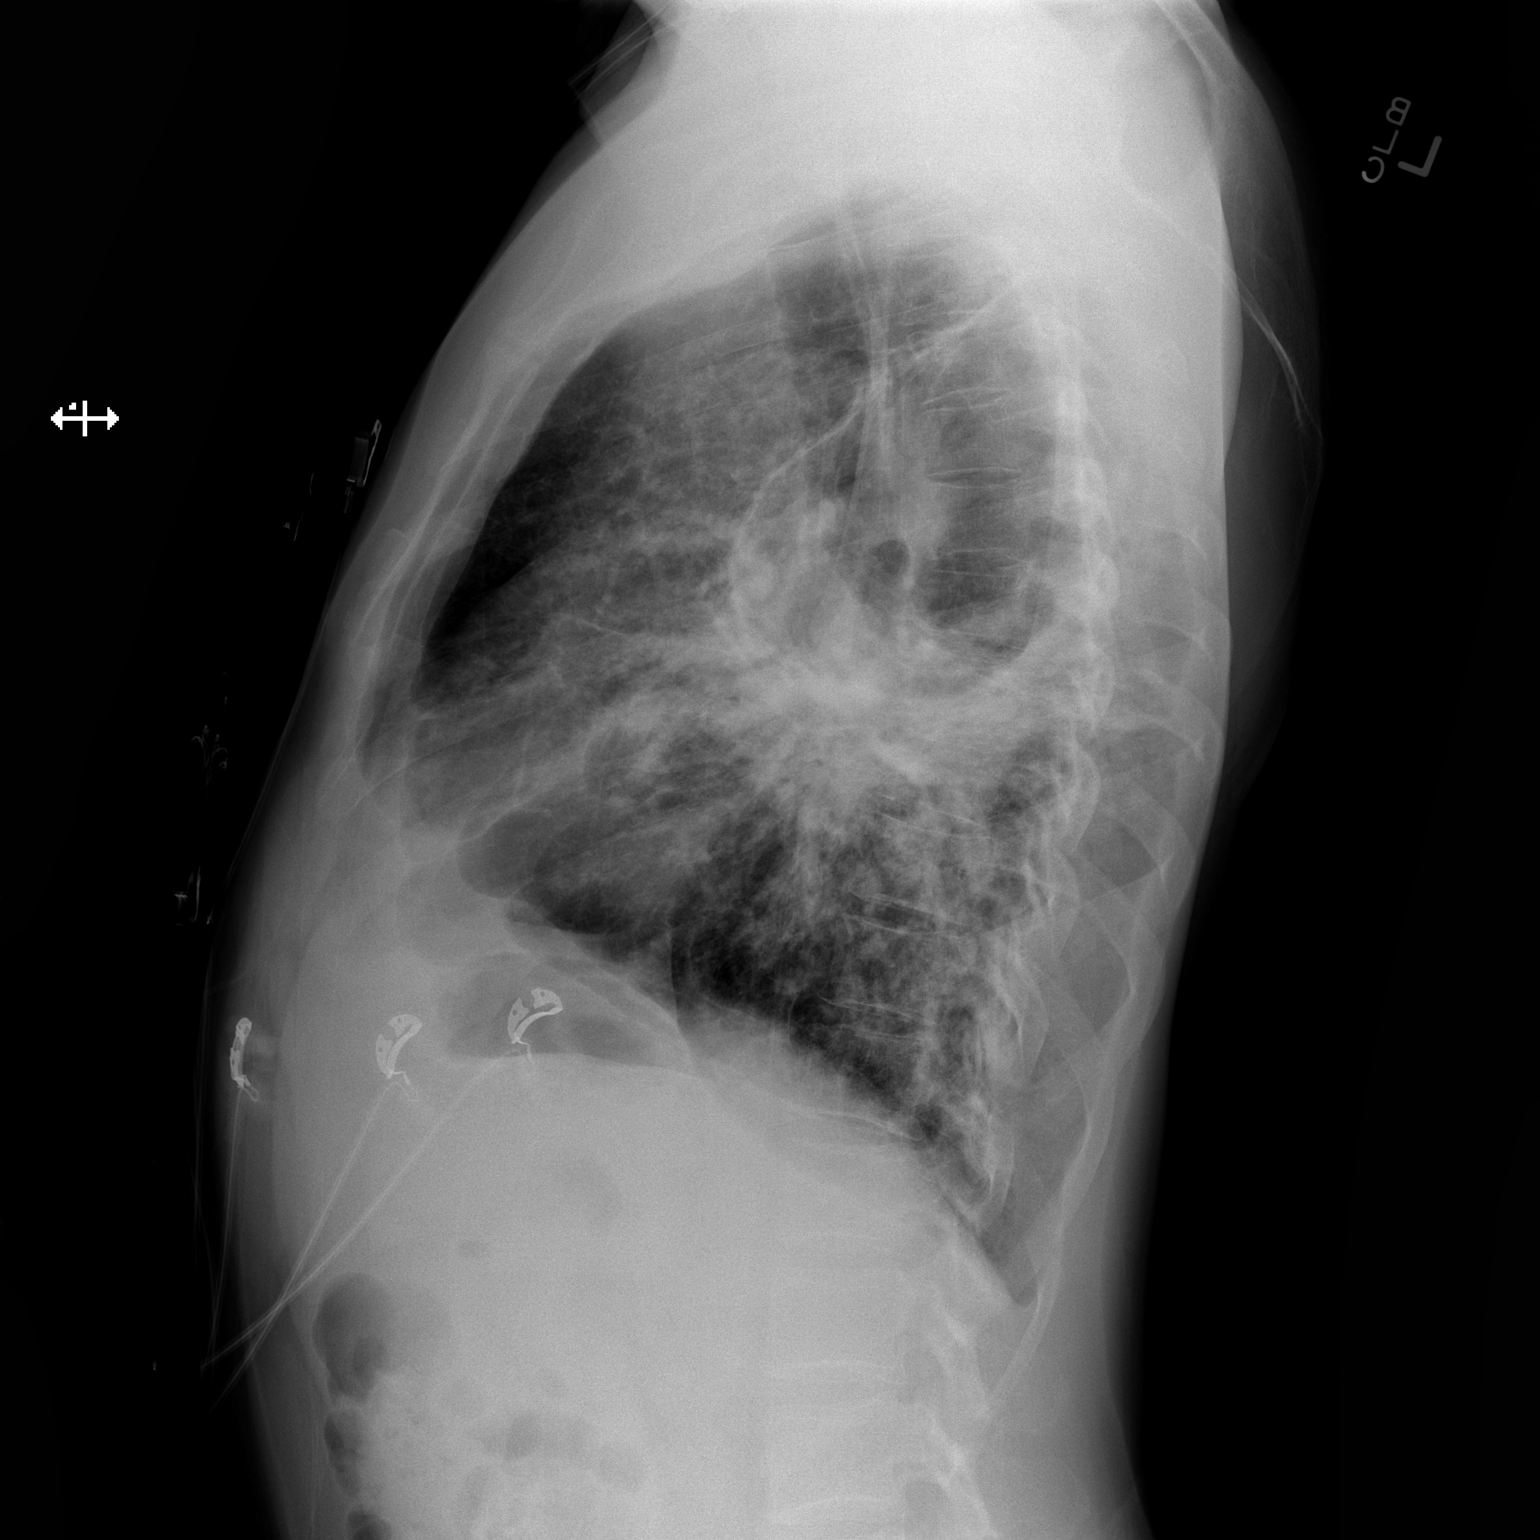

[2 of 2 positions shown; findings below may reference images not displayed]

FINDINGS: Bullous lung disease and chronic patchy bilateral
pulmonary infiltrates show no significant change compared to
previous study.  No acute or superimposed areas of infiltrate are
seen.  There is no evidence of pleural effusion.  Heart size and
mediastinal contours are stable and within normal limits.
IMPRESSION: Stable chronic bilateral pulmonary infiltrates and bullous lung
disease.  No acute findings.

## 2013-02-27 ENCOUNTER — Encounter (HOSPITAL_COMMUNITY): Payer: 59

## 2013-02-28 ENCOUNTER — Ambulatory Visit (INDEPENDENT_AMBULATORY_CARE_PROVIDER_SITE_OTHER): Payer: 59 | Admitting: Emergency Medicine

## 2013-02-28 ENCOUNTER — Ambulatory Visit (INDEPENDENT_AMBULATORY_CARE_PROVIDER_SITE_OTHER)
Admission: RE | Admit: 2013-02-28 | Discharge: 2013-02-28 | Disposition: A | Discharge status: Home / Self Care | Payer: 59 | Source: Ambulatory Visit | Attending: Emergency Medicine | Admitting: Emergency Medicine

## 2013-02-28 ENCOUNTER — Encounter: Payer: Self-pay | Admitting: Emergency Medicine

## 2013-02-28 VITALS — BP 140/92 | HR 85 | Ht 67.5 in | Wt 185.6 lb

## 2013-02-28 DIAGNOSIS — J209 Acute bronchitis, unspecified: Secondary | ICD-10-CM

## 2013-02-28 DIAGNOSIS — D869 Sarcoidosis, unspecified: Secondary | ICD-10-CM

## 2013-02-28 DIAGNOSIS — G4733 Obstructive sleep apnea (adult) (pediatric): Secondary | ICD-10-CM

## 2013-02-28 DIAGNOSIS — Z9989 Dependence on other enabling machines and devices: Secondary | ICD-10-CM

## 2013-02-28 MED ORDER — HYDROCOD POLST-CHLORPHEN POLST 10-8 MG/5ML PO LQCR: 5.0000 mL | Freq: Two times a day (BID) | ORAL | Status: DC | PRN

## 2013-02-28 MED ORDER — LEVOFLOXACIN 750 MG PO TABS: 750.0000 mg | ORAL_TABLET | Freq: Every day | ORAL | Status: DC

## 2013-02-28 NOTE — Assessment & Plan Note (Signed)
Continue current BD regimen ?

## 2013-02-28 NOTE — Assessment & Plan Note (Signed)
Continue CPAP.  

## 2013-02-28 NOTE — Progress Notes (Signed)
Subjective:    Patient ID: [name], male    DOB: 09-27-1965, 48 y.o.   MRN: [id number]  HPI 48 yo man, never smoker, hx sarcoidosis dx by transbronchial bx's in 2002 and again in 2011 or 2012, also OSA on CPAP. He has occasional flares that sound asthmatic in nature, gets treated with prednisone. He flares typically a few times a year, but he has also gone for over a year without an exacerbation. He has never been on everyday meds, has been on pred with exacerbations - longest he was on it was 90 days. He has also been on a steroid-sparing agent before (?MTX), but it was stopped. He has a large cavity in R lung, scattered infiltrates. He has been on Advair before, Spiriva before, not currently. Has albuterol available prn, uses almost every day. Last PFT were last year at [facility].   ROV 01/06/12 -- sarcoidosis, R lung cavitary disease. Was recently hospitalized for an acute exacerbation. D/c after pred taper. Last CT scan was 4/13. He is using nebs 2 -3 x a day.   04/06/12  Post Hospital follow up  Patient presents for a post hospital followup. Patient was recently admitted with acute bronchiectasis, exacerbation, complicated by underlying sarcoidosis. Patient has had several recent hospitalizations and emergency room visits.   He was treated with aggressive pulmonary hygiene, antibiotics, and a steroid taper. Since discharge pt reports breathing is doing well overall but still wheezing, SOB, head congestion and prod cough with clear  Mucus .  Patient denies any hemoptysis, orthopnea, PND, or leg swelling. During admission. Patient has had cocaine, positive urine drug screen in his past. He did receive a social work consult. Patient denies any drug use at today's visit.  He was discharged on prednisone to recommend to hold at 20 mg however, patient misunderstood instructions and has stopped all steroids.  ROV 05/18/12 -- sarcoidosis, bronchiectasis and R cavitary disease. Regular f/u visit. Has  been doing well, some intermittent SOB. Taking allegra and benadryl. He is on QVAR daily, off prednisone. Uses SABA rarely. He is reliable w CPAP.   ROV 06/27/12 -- sarcoidosis, bronchiectasis and R cavitary disease. Also OSA, HTN w diastolic dysfxn.  Was admitted for flare obstructive lung disease/sarcoidosis 6/6 - 6/8. He underwent repeat CT scan chest today >> stable bullous and parenchymal changes compared with 02/2012. He feels back to baseline clinically. Having some cough, clear phlegm. On flonase, allegra and benadryl.   post hospital followup. Patient returns for a post hospital followup. Patient had a complicated hospital course, which he was admitted June 30, and discharged on 08/04/2012. Patient was admitted with acute respiratory failure with a left tension pneumothorax requiring mechanical ventilation. Patient had a persistent left pneumothorax with subcutaneous emphysema around his chest tube. He was seen by thoracic surgery and underwent a left video-assisted thoracoscopy on July 7 with stapling of apical blebs and mechanical thoracentesis. Pt cont w/ dyspnea/fevers.  CT scan on July 20 showed a, moderate left pleural effusion with loculation anteriorly/? HCAP >Underwent thoracentesis was done with 90 cc removed. Analysis was consistent with an exudative effusion and his culture data was negative. He was treated with abx and discharged on Levaquin to complete a ten-day course. And steroid taper-which he has a  few days left.  He was seen by thoracic surgery yesterday.chest x-ray showed a mild right apical pneumothorax. That is unchanged from previous exam.   Since discharge he is feeling better but still very weak.  Feels  breathing  is doing well overall.  does report some pain at the incision site and in the left lung.  finished levaquin yesterday, still taking prednisone at 3tabs daily. No hemoptysis , orthopnea or edema.   ROV 09/15/12 - sarcoidosis, bronchiectasis and R cavitary disease,  complicated hospitalization for PTX as above. Hasn't been using his CPAP in order to avoid positive pressure following his pneumothorax. He was seen 8/21 and treated with levofloxacin and prednisone for bronchitis versus pneumonia. He needs his portable concentrator, [employer] doesn't provide. [facility] also [facility], [facility] Uses albuterol prn, about once a day. Pulmicort bid.   ROV 01/17/13 -- sarcoidosis, bronchiectasis and R cavitary disease, OSA back on CPAP. Hx PTX on L. He began to have an exacerbation end of Dec > treated with pred taper and now improved. He is using albuterol nebs prn, pulmicort nebs bid.   ROV 02/28/13 -- sarcoidosis, bronchiectasis and R cavitary disease, OSA back on CPAP. I have sent him for transplant eval, not a candidate due to positive drug screen for cocaine. He presents today with more dyspnea. Chills for 3-4 days. He has cough with yellow/green.     Objective:   Physical Exam  Filed Vitals:   02/28/13 1345  BP: 140/92  Pulse: 85  Height: 5' 7.5" (1.715 m)  Weight: 185 lb 9.6 oz (84.188 kg)  SpO2: 96%   Gen: Pleasant, well-nourished, in no distress,  normal affect  ENT: No lesions,  mouth clear,  oropharynx clear, no postnasal drip  Neck: No JVD, no TMG, no carotid bruits  Lungs:  Very distant, Diminshed BS in bases, no wheezes  Cardiovascular: RRR, heart sounds normal, no murmur or gallops, no peripheral edema  Musculoskeletal: No deformities, no cyanosis or clubbing  Neuro: alert, non focal  Skin: Warm, no lesions or rashes  CXR 08/09/12 Mild right apical pneumothorax is noted which is unchanged compared . Stable bilateral lung opacities compared to prior exam consistent with history of sarcoidosis.   CT chest 07/30/12 >Increase in a small to moderate left pleural effusion since  most recent examination. Component along the anterior chest wall  has a lenticular configuration and may be loculated.  2. Perihilar  opacities and traction bronchiectasis consistent with  history of sarcoidosis. Extent of opacity in the superior segment  left lower lobe appears somewhat increased since the 06/27/2012  study but not notably changed compared 02/14/12. This could be due  to increased atelectasis or pneumonia.  3. Negative for pneumothorax. Extensive subcutaneous air about  the chest noted.  CT chest 06/27/12 --  Comparison: CTA chest dated 02/14/2012  Findings: 10.1 x 7.8 cm thin-walled cavitary/bullous lesion in the  posterior right upper lobe (series 4/image 20), unchanged.  Confluent perihilar opacities with traction bronchiectasis and  architectural distortion, related to known sarcoidosis. Associated  peribronchovascular nodularity bilaterally.  On inspiratory / expiratory imaging, there is no evidence of air  trapping.  No superimposed opacities suspicious for pneumonia. No pleural  effusion or pneumothorax.  Heart is normal in size. No pericardial effusion.  Partially calcified mediastinal lymphadenopathy, including:  --1.3 cm short-axis right paratracheal node (series 2/image 17)  --1.0 cm short-axis AP window node (series 2/image 18)  --1.9 cm short-axis subcarinal node (series 2/image 27)  Associated hilar lymphadenopathy is suspected but difficult to  discretely measure in the absence of intravenous contrast  administration. The overall appearance is unchanged.  Visualized upper abdomen is unremarkable.  Visualized osseous structures are within normal  limits.       Assessment & Plan:   Acute bronchitis - continue usual BDs - levaquin  - tussionex prn - CXR today  OSA on CPAP Continue CPAP  Sarcoid Continue current BD regimen

## 2013-02-28 NOTE — Patient Instructions (Signed)
CXR today  Continue your inhaled medications and oxygen as you have been taking them Please take Levaquin as directed.  Use tussionex 5cc up to every 12h if needed for cough Follow with Dr Delton CoombesByrum in 1 month

## 2013-02-28 NOTE — Assessment & Plan Note (Signed)
-   continue usual BDs - levaquin  - tussionex prn - CXR today

## 2013-03-01 ENCOUNTER — Encounter (HOSPITAL_COMMUNITY): Payer: 59

## 2013-03-06 ENCOUNTER — Encounter (HOSPITAL_COMMUNITY): Payer: 59

## 2013-03-08 ENCOUNTER — Encounter (HOSPITAL_COMMUNITY): Payer: 59

## 2013-03-13 ENCOUNTER — Encounter (HOSPITAL_COMMUNITY): Payer: 59

## 2013-03-13 ENCOUNTER — Telehealth: Payer: Self-pay | Admitting: Emergency Medicine

## 2013-03-13 MED ORDER — PREDNISONE 10 MG PO TABS: ORAL_TABLET | ORAL | Status: DC

## 2013-03-13 NOTE — Telephone Encounter (Signed)
pred taper: Take 40mg daily for 3 days, then 30mg daily for 3 days, then 20mg daily for 3 days, then 10mg daily for 3 days, then stop  

## 2013-03-13 NOTE — Telephone Encounter (Signed)
Spoke with Annice PihJackie. Reports increased wheezing and congestion x2 days. Cough, SOB and chest tightness are present. Mucus is yellow in color. They are requesting prednisone be called in.  RB - please advise. Thanks.

## 2013-03-13 NOTE — Telephone Encounter (Signed)
Called back requesting an update.  [name]

## 2013-03-13 NOTE — Telephone Encounter (Signed)
Pt aware rx sent to pharm nothing further needed

## 2013-03-13 NOTE — Telephone Encounter (Signed)
Pt advised we have [facility] and that we will not leave today without calling them with a response. Carron CurieJennifer Castillo, CMA

## 2013-03-15 ENCOUNTER — Encounter (HOSPITAL_COMMUNITY): Payer: 59

## 2013-03-20 ENCOUNTER — Encounter (HOSPITAL_COMMUNITY)
Admission: RE | Admit: 2013-03-20 | Discharge: 2013-03-20 | Disposition: A | Discharge status: Home / Self Care | Payer: 59 | Source: Ambulatory Visit | Attending: Emergency Medicine | Admitting: Emergency Medicine

## 2013-03-20 DIAGNOSIS — Z5189 Encounter for other specified aftercare: Secondary | ICD-10-CM | POA: Insufficient documentation

## 2013-03-20 DIAGNOSIS — D869 Sarcoidosis, unspecified: Secondary | ICD-10-CM | POA: Insufficient documentation

## 2013-03-20 DIAGNOSIS — I4892 Unspecified atrial flutter: Secondary | ICD-10-CM | POA: Insufficient documentation

## 2013-03-20 DIAGNOSIS — J9610 Chronic respiratory failure, unspecified whether with hypoxia or hypercapnia: Secondary | ICD-10-CM | POA: Insufficient documentation

## 2013-03-22 ENCOUNTER — Encounter (HOSPITAL_COMMUNITY): Payer: 59

## 2013-03-27 ENCOUNTER — Encounter (HOSPITAL_COMMUNITY): Payer: Self-pay

## 2013-03-27 ENCOUNTER — Encounter (HOSPITAL_COMMUNITY)
Admission: RE | Admit: 2013-03-27 | Discharge: 2013-03-27 | Disposition: A | Discharge status: Home / Self Care | Payer: 59 | Source: Ambulatory Visit | Attending: Emergency Medicine | Admitting: Emergency Medicine

## 2013-03-27 NOTE — Progress Notes (Signed)
Today, [name] [facility].  The patient exercised for more than 31 minutes on different pieces of equipment. Oxygen saturation, heart rate, blood pressure, rate of perceived exertion, and shortness of breath were all monitored before, during, and after exercise. [name] presented with no problems at today's exercise session.    Dr. Kalman ShanMurali [name], Medical Director Dr. Gwenlyn PerkingMadera is immediately available during today's [name].

## 2013-03-28 ENCOUNTER — Ambulatory Visit (INDEPENDENT_AMBULATORY_CARE_PROVIDER_SITE_OTHER): Payer: 59 | Admitting: Emergency Medicine

## 2013-03-28 ENCOUNTER — Encounter: Payer: Self-pay | Admitting: Emergency Medicine

## 2013-03-28 VITALS — BP 140/88 | HR 126 | Ht 67.5 in | Wt 180.0 lb

## 2013-03-28 DIAGNOSIS — D869 Sarcoidosis, unspecified: Secondary | ICD-10-CM

## 2013-03-28 MED ORDER — ALBUTEROL SULFATE HFA 108 (90 BASE) MCG/ACT IN AERS: 2.0000 | INHALATION_SPRAY | Freq: Four times a day (QID) | RESPIRATORY_TRACT | Status: DC | PRN

## 2013-03-28 MED ORDER — BUDESONIDE-FORMOTEROL FUMARATE 160-4.5 MCG/ACT IN AERO: 2.0000 | INHALATION_SPRAY | Freq: Two times a day (BID) | RESPIRATORY_TRACT | Status: DC

## 2013-03-28 NOTE — Patient Instructions (Signed)
Please stop pulmicort nebs We will start Symbicort 160/4.225mcg, 2 puffs twice a day Use ProAir 2 puffs if needed for shortness of breath Wear your oxygen at all times.  Follow with Dr Delton CoombesByrum in 6 weeks

## 2013-03-28 NOTE — Assessment & Plan Note (Signed)
-   we will stop pulmicort nebs - start Symbicort bid - start albuterol HFA prn - continue O2 - consider ultram in future if needed for his flank pain.

## 2013-03-28 NOTE — Progress Notes (Signed)
Subjective:   Patient ID: [name], male    DOB: 11/08/1965, 48 y.o.   MRN: [id number]  HPI 48 yo man, never smoker, hx sarcoidosis dx by transbronchial bx's in 2002 and again in 2011 or 2012, also OSA on CPAP. He has occasional flares that sound asthmatic in nature, gets treated with prednisone. He flares typically a few times a year, but he has also gone for over a year without an exacerbation. He has never been on everyday meds, has been on pred with exacerbations - longest he was on it was 90 days. He has also been on a steroid-sparing agent before (?MTX), but it was stopped. He has a large cavity in R lung, scattered infiltrates. He has been on Advair before, Spiriva before, not currently. Has albuterol available prn, uses almost every day. [facility] were last [facility].   ROV 01/06/12 -- sarcoidosis, R lung cavitary disease. Was recently hospitalized for an acute exacerbation. D/c after pred taper. Last CT scan was 4/13. He is using nebs 2 -3 x a day.   04/06/12  Post Hospital follow up  Patient presents for a post hospital followup. Patient was recently admitted with acute bronchiectasis, exacerbation, complicated by underlying sarcoidosis. Patient has had several recent hospitalizations and emergency room visits.   He was treated with aggressive pulmonary hygiene, antibiotics, and a steroid taper. Since discharge pt reports breathing is doing well overall but still wheezing, SOB, head congestion and prod cough with clear  Mucus .  Patient denies any hemoptysis, orthopnea, PND, or leg swelling. During admission. Patient has had cocaine, positive urine drug screen in his past. He did receive a social work consult. Patient denies any drug use at today's visit.  He was discharged on prednisone to recommend to hold at 20 mg however, patient misunderstood instructions and has stopped all steroids.  ROV 05/18/12 -- sarcoidosis, bronchiectasis and R cavitary disease. Regular f/u visit. Has  been doing well, some intermittent SOB. Taking allegra and benadryl. He is on QVAR daily, off prednisone. Uses SABA rarely. He is reliable w CPAP.   ROV 06/27/12 -- sarcoidosis, bronchiectasis and R cavitary disease. Also OSA, HTN w diastolic dysfxn.  Was admitted for flare obstructive lung disease/sarcoidosis 6/6 - 6/8. He underwent repeat CT scan chest today >> stable bullous and parenchymal changes compared with 02/2012. He feels back to baseline clinically. Having some cough, clear phlegm. On flonase, allegra and benadryl.   post hospital followup. Patient returns for a post hospital followup. Patient had a complicated hospital course, which he was admitted June 30, and discharged on 08/04/2012. Patient was admitted with acute respiratory failure with a left tension pneumothorax requiring mechanical ventilation. Patient had a persistent left pneumothorax with subcutaneous emphysema around his chest tube. He was seen by thoracic surgery and underwent a left video-assisted thoracoscopy on July 7 with stapling of apical blebs and mechanical thoracentesis. Pt cont w/ dyspnea/fevers.  CT scan on July 20 showed a, moderate left pleural effusion with loculation anteriorly/? HCAP >Underwent thoracentesis was done with 90 cc removed. Analysis was consistent with an exudative effusion and his culture data was negative. He was treated with abx and discharged on Levaquin to complete a ten-day course. And steroid taper-which he has a  few days left.  He was seen by thoracic surgery yesterday.chest x-ray showed a mild right apical pneumothorax. That is unchanged from previous exam.   Since discharge he is feeling better but still very weak.  Feels  breathing is  doing well overall.  does report some pain at the incision site and in the left lung.  finished levaquin yesterday, still taking prednisone at 3tabs daily. No hemoptysis , orthopnea or edema.   ROV 09/15/12 - sarcoidosis, bronchiectasis and R cavitary disease,  complicated hospitalization for PTX as above. Hasn't been using his CPAP in order to avoid positive pressure following his pneumothorax. He was seen 8/21 and treated with levofloxacin and prednisone for bronchitis versus pneumonia. He needs his portable concentrator, [employer] doesn't provide. He also cannot get transplant eval at [facility], needs referral to [facility]. Uses albuterol prn, about once a day. Pulmicort bid.   ROV 01/17/13 -- sarcoidosis, bronchiectasis and R cavitary disease, OSA back on CPAP. Hx PTX on L. He began to have an exacerbation end of Dec > treated with pred taper and now improved. He is using albuterol nebs prn, pulmicort nebs bid.   ROV 02/28/13 -- sarcoidosis, bronchiectasis and R cavitary disease, OSA back on CPAP. I have sent him for transplant eval, not a candidate due to positive drug screen for cocaine. He presents today with more dyspnea. Chills for 3-4 days. He has cough with yellow/green.   ROV 03/28/13 -- sarcoidosis, bronchiectasis and R cavitary disease. OSA on CPAP. He has some numbness and pain in his L chest and flank. His breathing and coughing are better. He is interested in changing to inhalers instead of nebs.     Objective:   Physical Exam  Filed Vitals:   03/28/13 1544  BP: 140/88  Pulse: 126  Height: 5' 7.5" (1.715 m)  Weight: 180 lb (81.647 kg)  SpO2: 96%   Gen: Pleasant, well-nourished, in no distress,  normal affect  ENT: No lesions,  mouth clear,  oropharynx clear, no postnasal drip  Neck: No JVD, no TMG, no carotid bruits  Lungs:  Very distant, Diminshed BS in bases, no wheezes  Cardiovascular: RRR, heart sounds normal, no murmur or gallops, no peripheral edema  Musculoskeletal: No deformities, no cyanosis or clubbing  Neuro: alert, non focal  Skin: Warm, no lesions or rashes  CXR 08/09/12 Mild right apical pneumothorax is noted which is unchanged compared . Stable bilateral lung opacities compared to prior exam consistent with history of  sarcoidosis.   CT chest 07/30/12 >Increase in a small to moderate left pleural effusion since  most recent examination. Component along the anterior chest wall  has a lenticular configuration and may be loculated.  2. Perihilar opacities and traction bronchiectasis consistent with  history of sarcoidosis. Extent of opacity in the superior segment  left lower lobe appears somewhat increased since the 06/27/2012  study but not notably changed compared 02/14/12. This could be due  to increased atelectasis or pneumonia.  3. Negative for pneumothorax. Extensive subcutaneous air about  the chest noted.  CT chest 06/27/12 --  Comparison: CTA chest dated 02/14/2012  Findings: 10.1 x 7.8 cm thin-walled cavitary/bullous lesion in the  posterior right upper lobe (series 4/image 20), unchanged.  Confluent perihilar opacities with traction bronchiectasis and  architectural distortion, related to known sarcoidosis. Associated  peribronchovascular nodularity bilaterally.  On inspiratory / expiratory imaging, there is no evidence of air  trapping.  No superimposed opacities suspicious for pneumonia. No pleural  effusion or pneumothorax.  Heart is normal in size. No pericardial effusion.  Partially calcified mediastinal lymphadenopathy, including:  --1.3 cm short-axis right paratracheal node (series 2/image 17)  --1.0 cm short-axis AP window node (series 2/image 18)  --1.9 cm short-axis subcarinal node (series 2/image  27)  Associated hilar lymphadenopathy is suspected but difficult to  discretely measure in the absence of intravenous contrast  administration. The overall appearance is unchanged.  Visualized upper abdomen is unremarkable.  Visualized osseous structures are within normal limits.       Assessment & Plan:   Sarcoid - we will stop pulmicort nebs - start Symbicort bid - start albuterol HFA prn - continue O2 - consider ultram in future if needed for his flank pain.

## 2013-03-29 ENCOUNTER — Encounter (HOSPITAL_COMMUNITY): Payer: 59

## 2013-04-03 ENCOUNTER — Encounter (HOSPITAL_COMMUNITY): Payer: 59

## 2013-04-05 ENCOUNTER — Encounter (HOSPITAL_COMMUNITY): Payer: 59

## 2013-04-10 ENCOUNTER — Encounter (HOSPITAL_COMMUNITY): Payer: 59

## 2013-04-12 ENCOUNTER — Encounter (HOSPITAL_COMMUNITY): Payer: 59

## 2013-04-17 ENCOUNTER — Encounter (HOSPITAL_COMMUNITY)
Admission: RE | Admit: 2013-04-17 | Discharge: 2013-04-17 | Disposition: A | Discharge status: Home / Self Care | Payer: 59 | Source: Ambulatory Visit | Attending: Emergency Medicine | Admitting: Emergency Medicine

## 2013-04-17 ENCOUNTER — Encounter (HOSPITAL_COMMUNITY): Payer: Self-pay

## 2013-04-17 DIAGNOSIS — Z5189 Encounter for other specified aftercare: Secondary | ICD-10-CM | POA: Insufficient documentation

## 2013-04-17 DIAGNOSIS — J9610 Chronic respiratory failure, unspecified whether with hypoxia or hypercapnia: Secondary | ICD-10-CM | POA: Insufficient documentation

## 2013-04-17 DIAGNOSIS — I4892 Unspecified atrial flutter: Secondary | ICD-10-CM | POA: Insufficient documentation

## 2013-04-17 DIAGNOSIS — D869 Sarcoidosis, unspecified: Secondary | ICD-10-CM | POA: Insufficient documentation

## 2013-04-17 NOTE — Progress Notes (Signed)
Today, [name] exercised at [facility]. Service time was from 1:30 to 3:30.  The patient exercised for more than 31 minutes performing aerobic, strengthening, and stretching exercises. Oxygen saturation, heart rate, blood pressure, rate of perceived exertion, and shortness of breath were all monitored before, during, and after exercise. [name] presented with no problems at today's exercise session.   There was an increase workload change during today's exercise session.  Pre-exercise vitals:   Weight kg: 80.3   Liters of O2: 2   SpO2: 92   HR: 106   BP: 138/80   CBG: na  Exercise vitals:   Highest heartrate:  112   Lowest oxygen saturation: 95   Highest blood pressure: 132/80   Liters of 02: 3  Post-exercise vitals:   SpO2: 99   HR: 93   BP: 140/90   Liters of O2: 2   CBG: na  Dr. Kalman ShanMurali Ramaswamy, Medical Director Dr. David StallFeliz-Ortiz is immediately available during today's Pulmonary Rehab session for [name] Baumannommy [name] on 04/17/13 at 1:30 class time.

## 2013-04-19 ENCOUNTER — Encounter (HOSPITAL_COMMUNITY): Payer: Self-pay

## 2013-04-19 ENCOUNTER — Encounter (HOSPITAL_COMMUNITY)
Admission: RE | Admit: 2013-04-19 | Discharge: 2013-04-19 | Disposition: A | Discharge status: Home / Self Care | Payer: 59 | Source: Ambulatory Visit | Attending: Emergency Medicine | Admitting: Emergency Medicine

## 2013-04-19 NOTE — Progress Notes (Signed)
Today, [name] exercised at [facility]. Service time was from 1:30 to 3:30.  The patient exercised for more than 31 minutes performing aerobic, strengthening, and stretching exercises. Oxygen saturation, heart rate, blood pressure, rate of perceived exertion, and shortness of breath were all monitored before, during, and after exercise. [name] presented with no problems at today's exercise session.  The patient attended "Advanced Directives" today with [name].  There was no workload change during today's exercise session.  Pre-exercise vitals:   Weight kg: 79.0   Liters of O2: 2   SpO2: 99   HR: 92   BP: 126/75   CBG: na  Exercise vitals:   Highest heartrate:  100   Lowest oxygen saturation: 95   Highest blood pressure: 128/74   Liters of 02: 3  Post-exercise vitals:   SpO2: 100   HR: 94   BP: 128/74   Liters of O2: 3   CBG: na  Dr. Kalman ShanMurali Ramaswamy, Medical Director Dr. Gwenlyn PerkingMadera is immediately available during today's Pulmonary Rehab session for [name] on 04/19/13 at 1:30 class time.

## 2013-04-25 IMAGING — CR DG CHEST 2V
2 series · 2 of 2 positions shown · non-contrast
Comparison: 12/16/2011; 01/15/2011; 10/21/2010; chest CT -
04/12/2011

CLINICAL DATA: History of pulmonary sarcoidosis, now with worsening
shortness of breath

CHEST - 2 VIEW

[w chest pa]
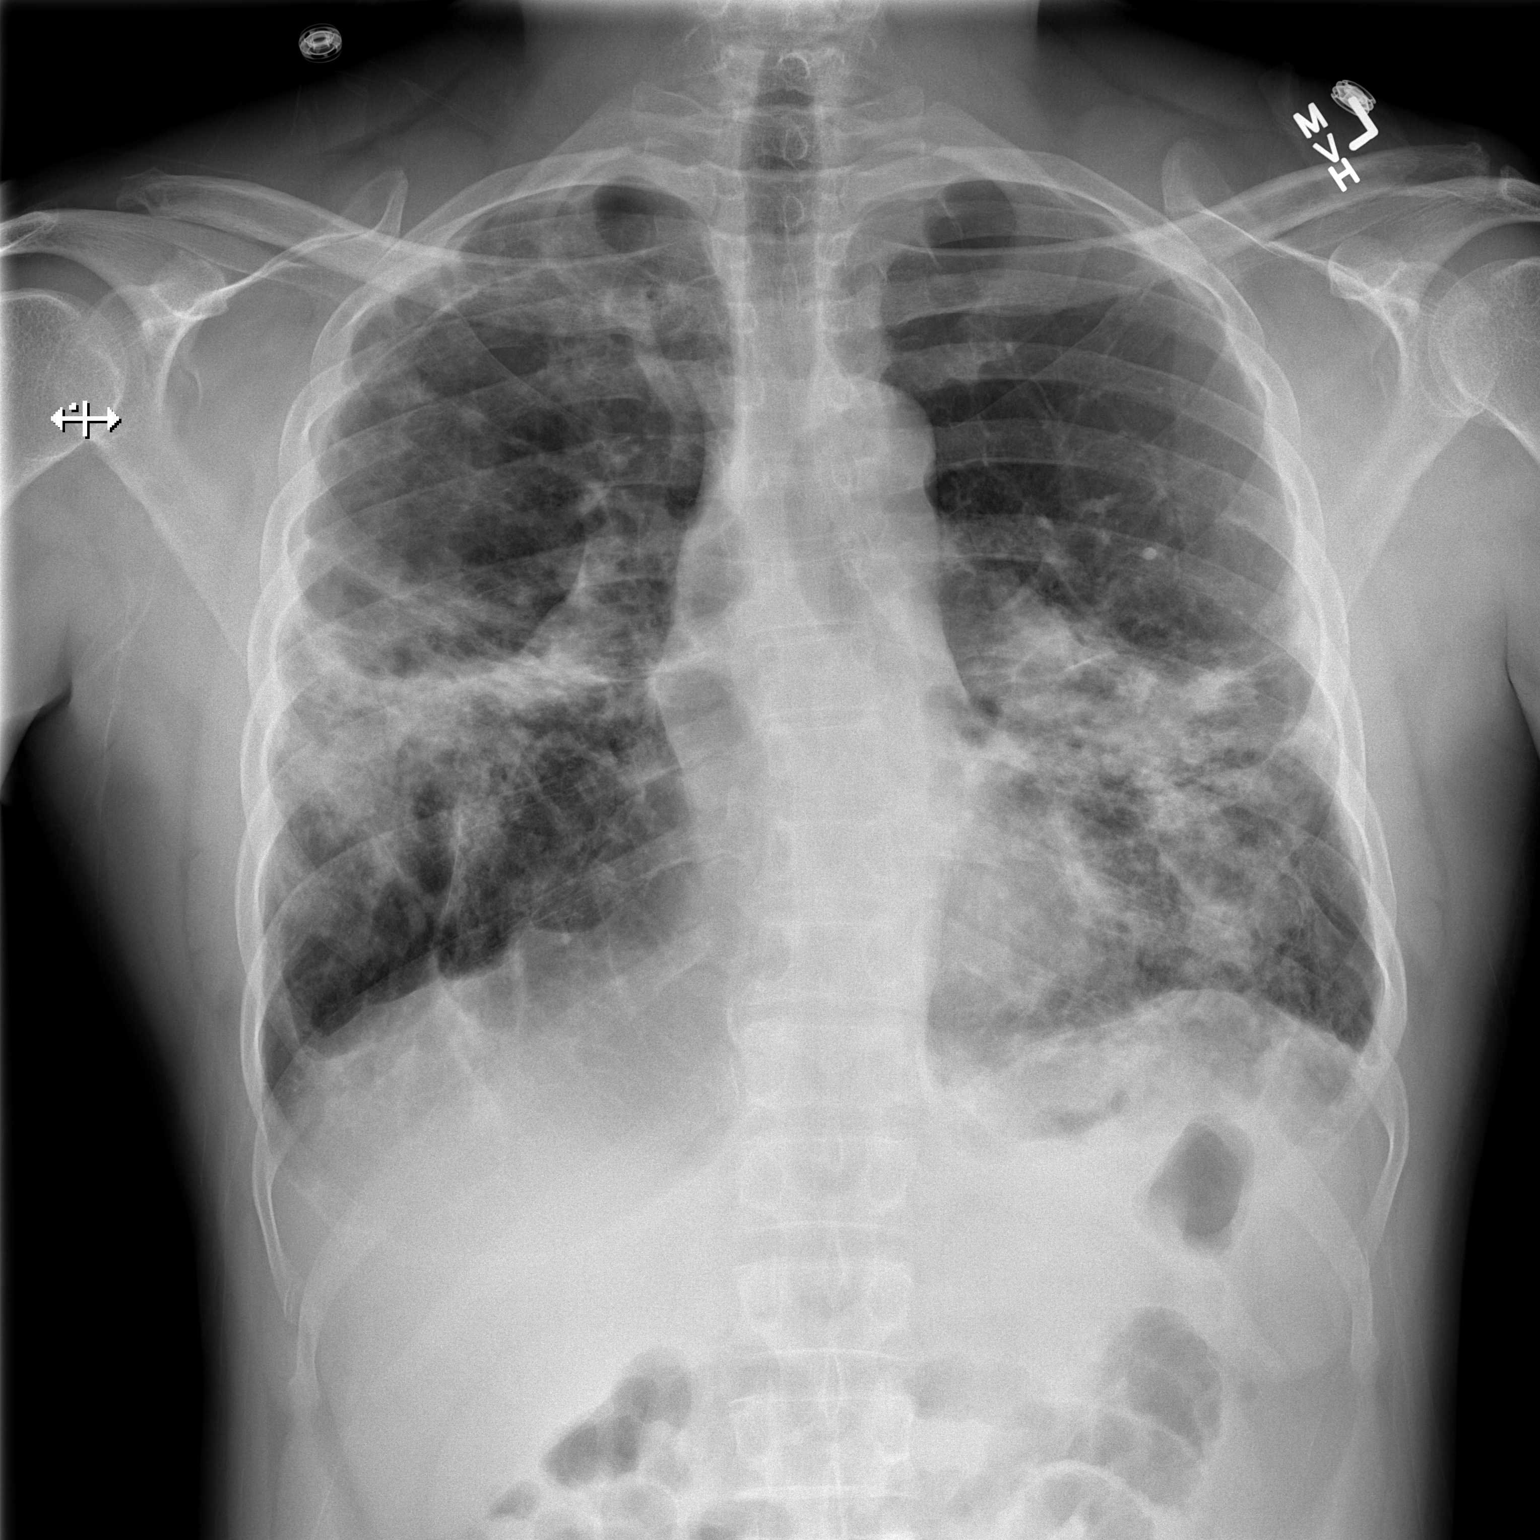

[w chest lat]
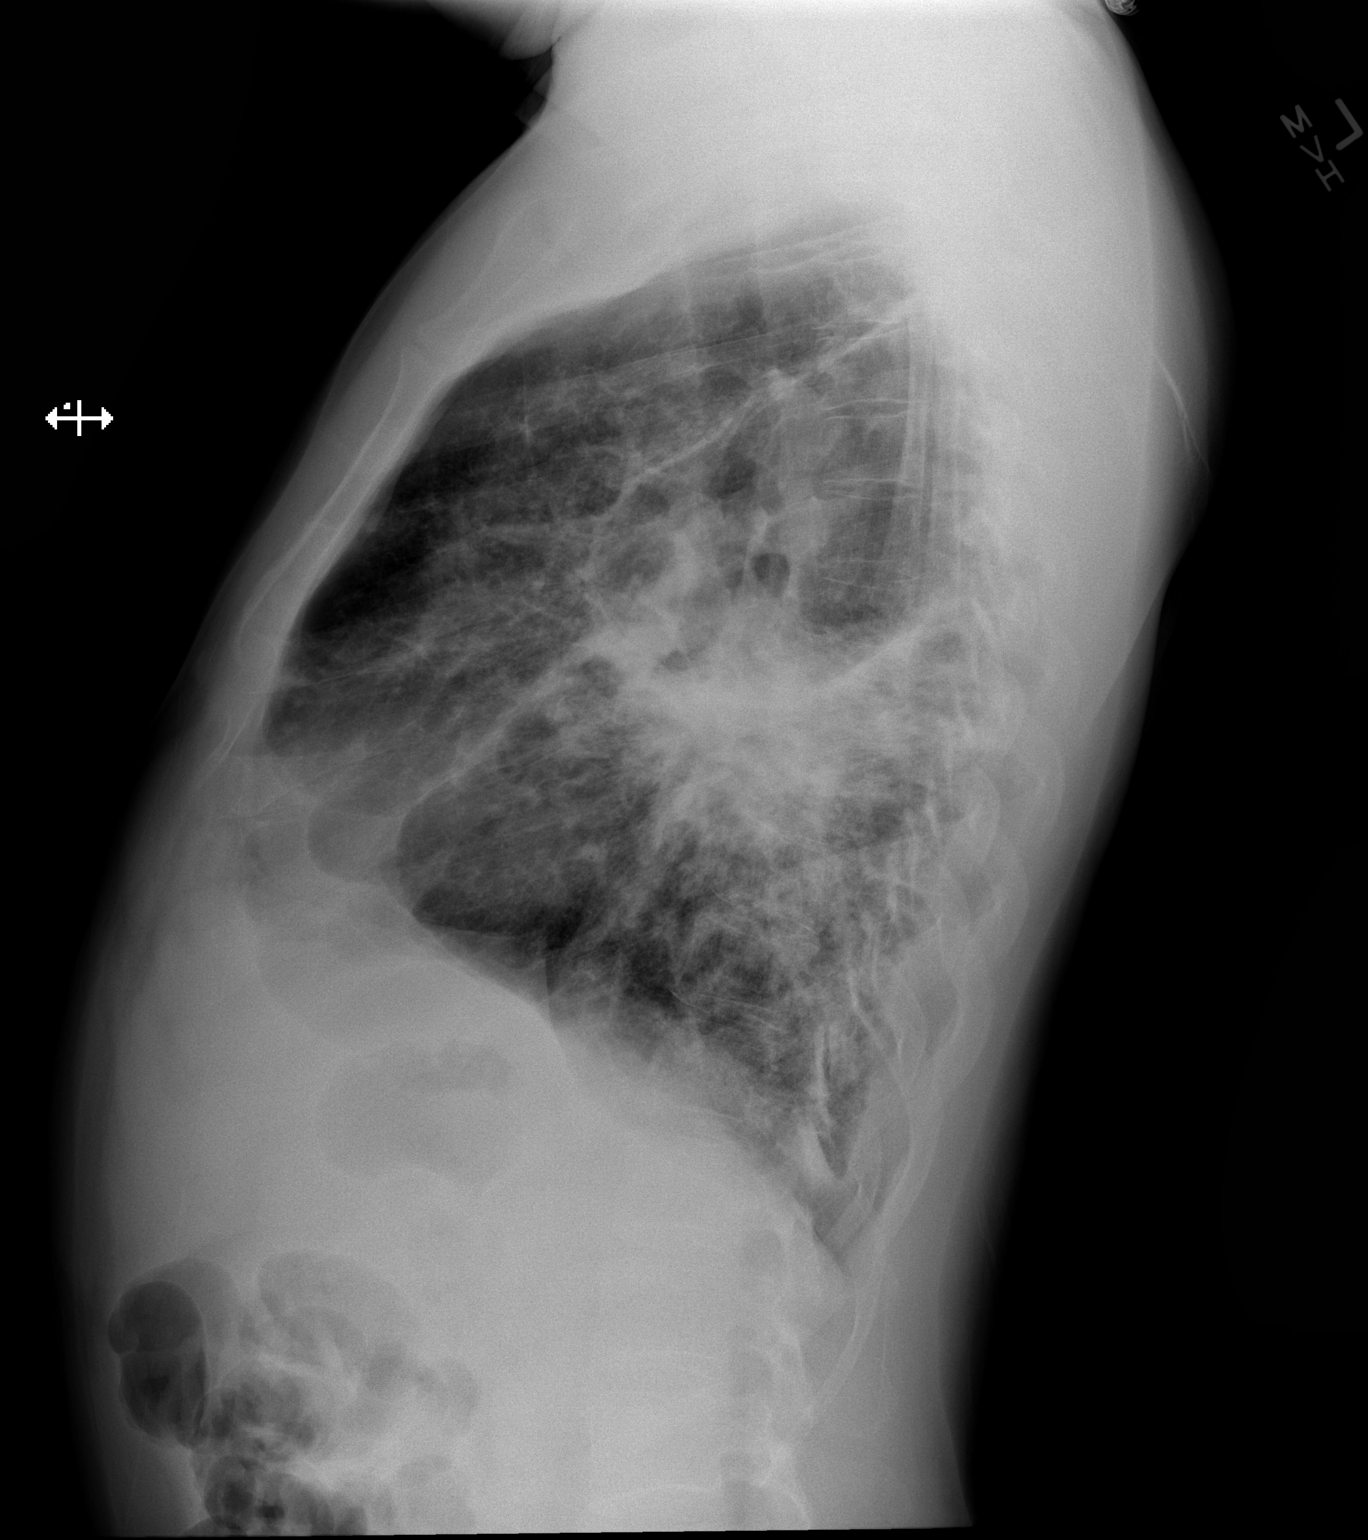

[2 of 2 positions shown; findings below may reference images not displayed]

FINDINGS: Grossly unchanged cardiac silhouette and mediastinal contours.
Extensive bilateral mid lung heterogeneous air space opacities are
grossly unchanged, left greater than right.  Grossly stable
findings of bullous change involving the right upper lobe with
associated asymmetric right apical pleuroparenchymal thickening.
Given extensive background parenchymal abnormalities, there are no
new focal airspace opacities.  No pleural effusion or pneumothorax.
Unchanged bones including resection of the distal end of the right
clavicle.
IMPRESSION: Grossly unchanged findings of extensive pulmonary sarcoidosis with
associated opacities and architectural distortion without definite
superimposed acute cardiopulmonary disease.

## 2013-04-26 IMAGING — CT CT ANGIO CHEST
2 of 6 series · 19 of 46 positions shown · IV contrast (OMNIPAQUE)
Comparison: 04/12/2011.

CLINICAL DATA: Progressive shortness of breath with tachycardia and
pleurisy.  Sarcoid.

CT ANGIOGRAPHY CHEST
TECHNIQUE: Multidetector CT imaging of the chest using the
standard protocol during bolus administration of intravenous
contrast. Multiplanar reconstructed images including MIPs were
obtained and reviewed to evaluate the vascular anatomy.
Contrast: 80mL OMNIPAQUE IOHEXOL 350 MG/ML SOLN

[Series 5: pe thins @ 1mm · axial · 0.70mm/px · z∈[-263,-5]mm · 16 of 284 slices shown]
[im 13/284  lung]
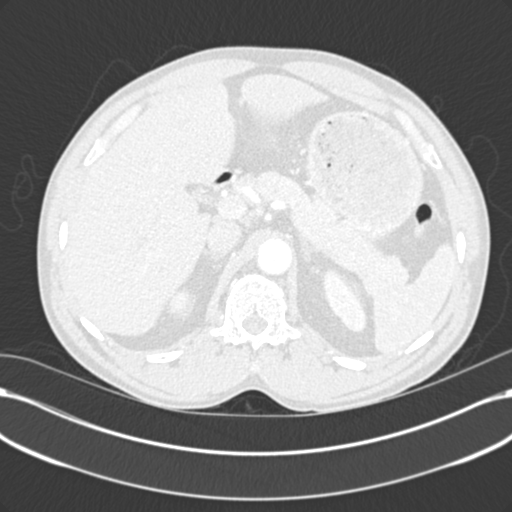
[im 37/284  soft-tissue]
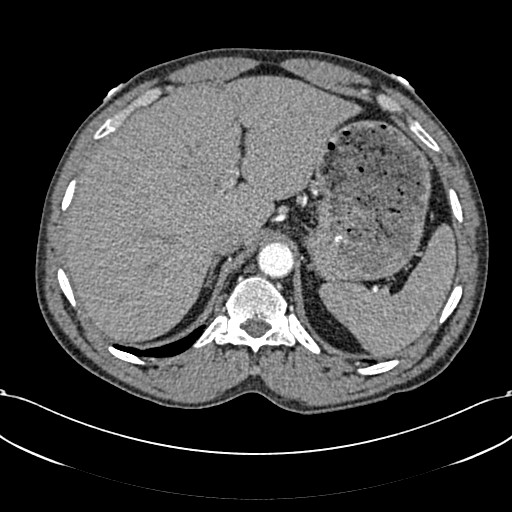
[im 50/284  lung]
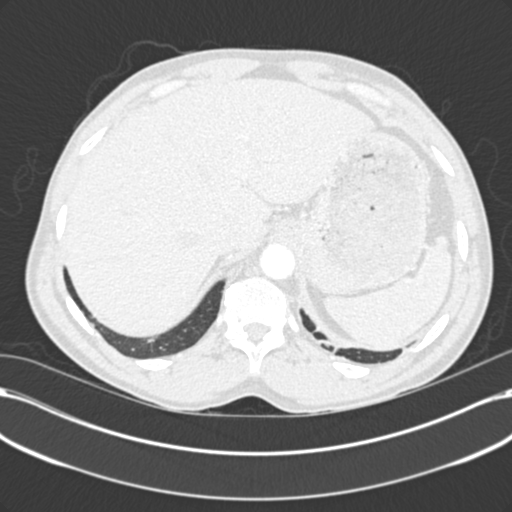
[im 62/284  soft-tissue]
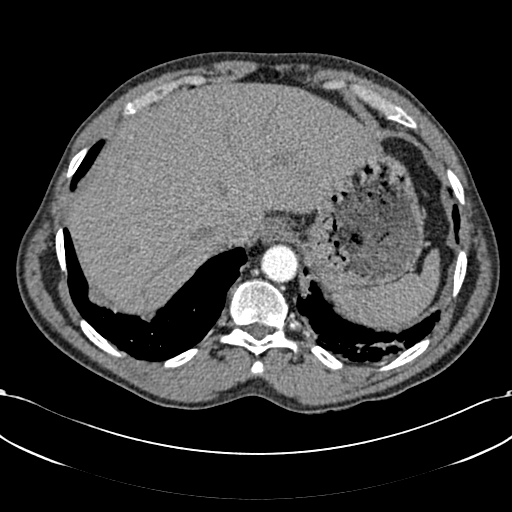
[im 87/284  lung]
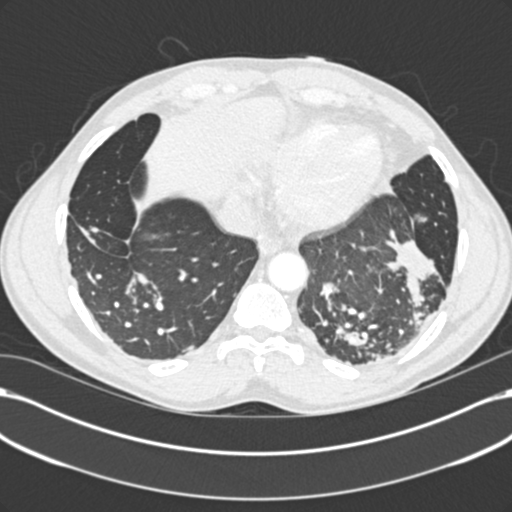
[im 99/284  soft-tissue]
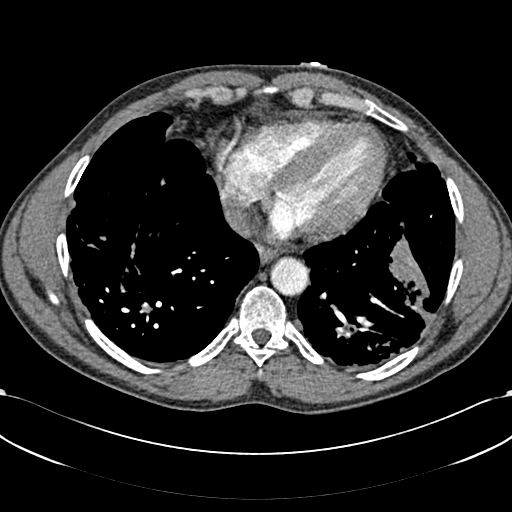
[im 111/284  lung]
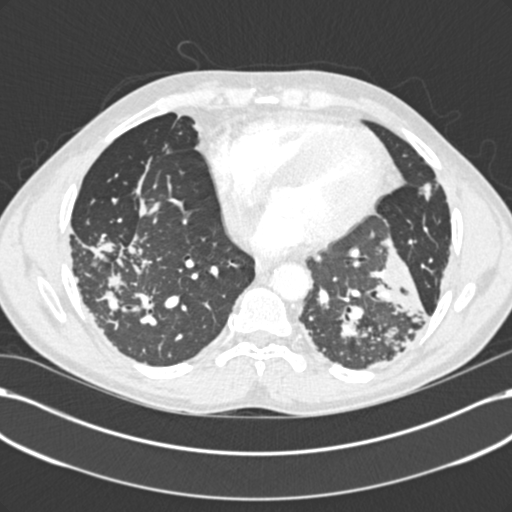
[im 136/284  soft-tissue]
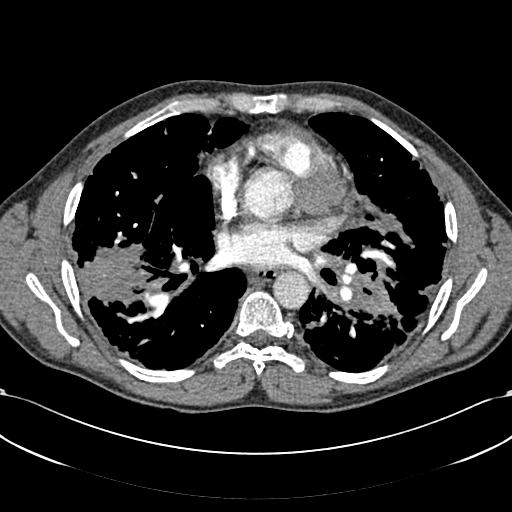
[im 148/284  lung]
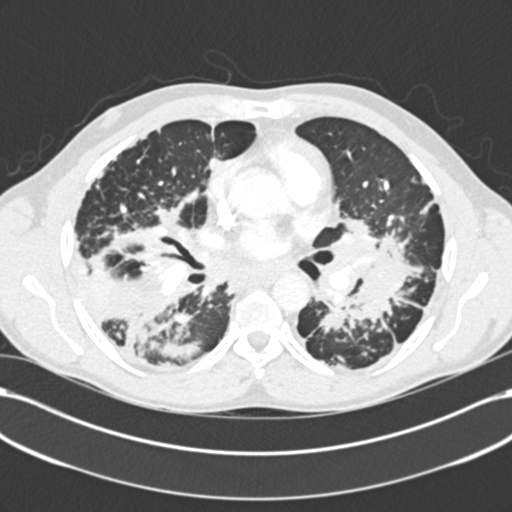
[im 173/284  soft-tissue]
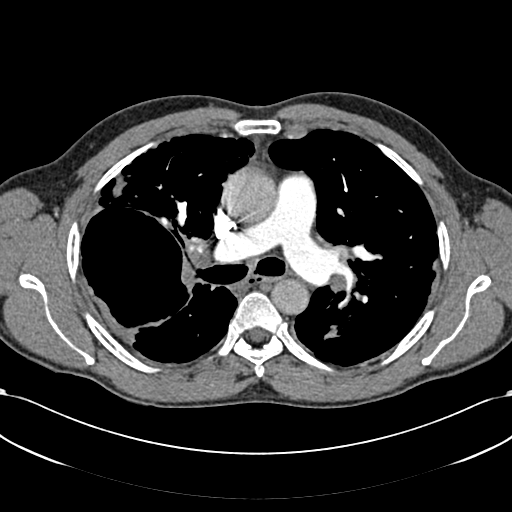
[im 185/284  lung]
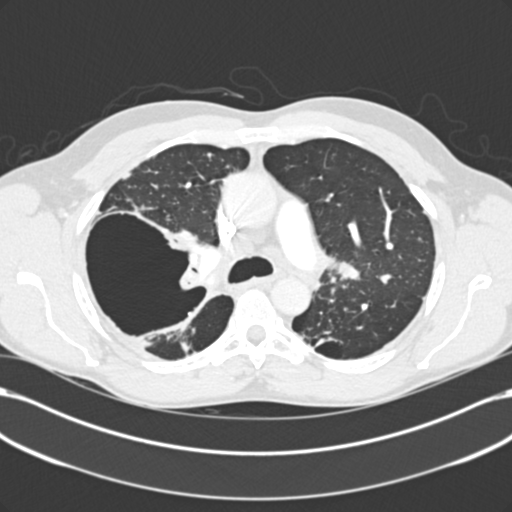
[im 197/284  soft-tissue]
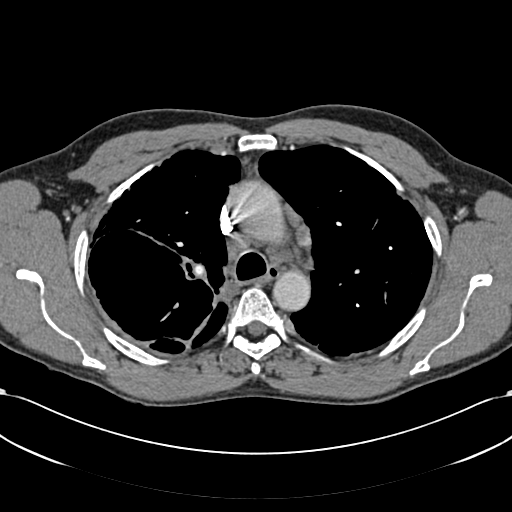
[im 222/284  lung]
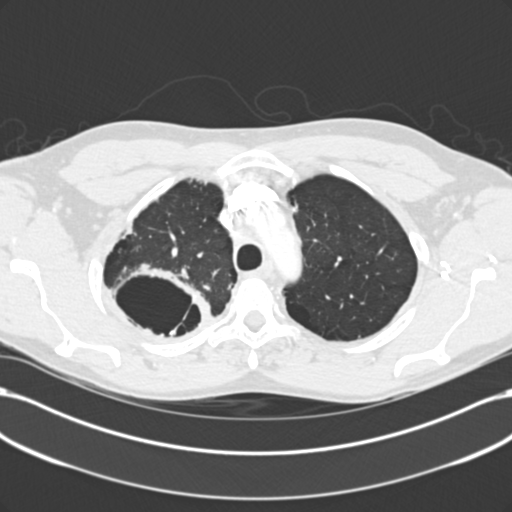
[im 234/284  soft-tissue]
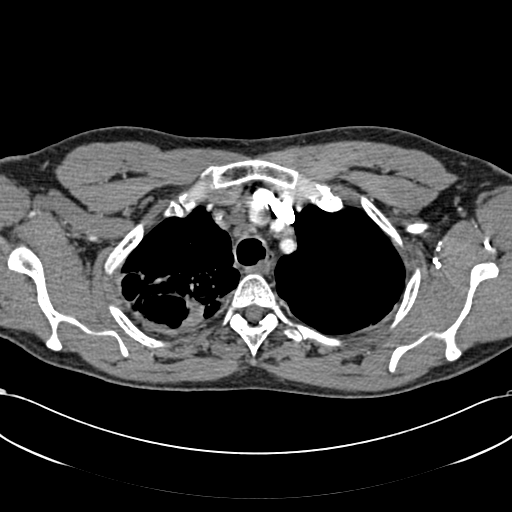
[im 247/284  lung]
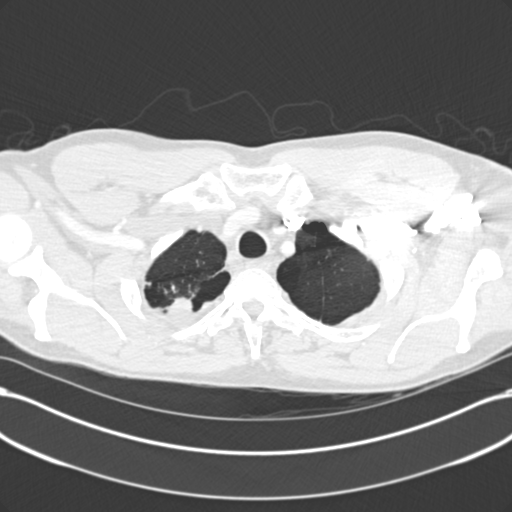
[im 271/284  soft-tissue]
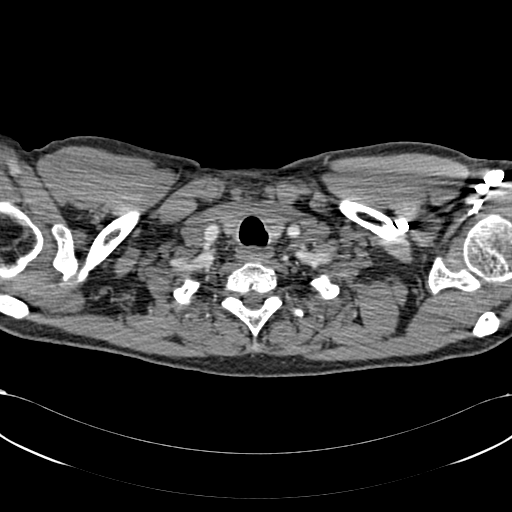

[Series 602: cor · coronal · 0.70mm/px · 3 of 117 slices shown]
[im 30/117  soft-tissue]
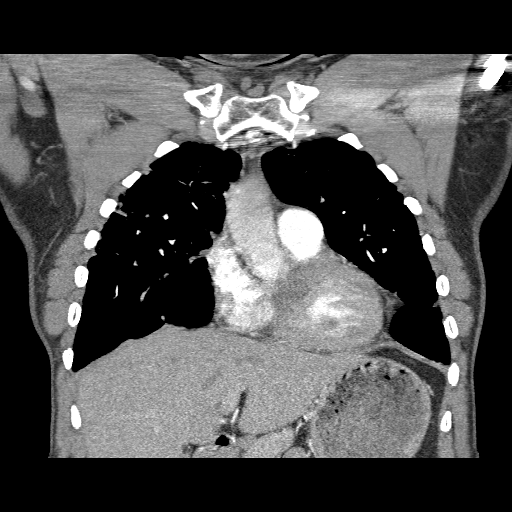
[im 59/117  soft-tissue]
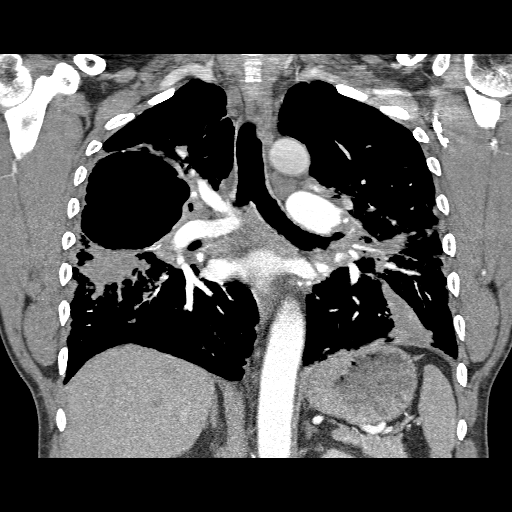
[im 88/117  soft-tissue]
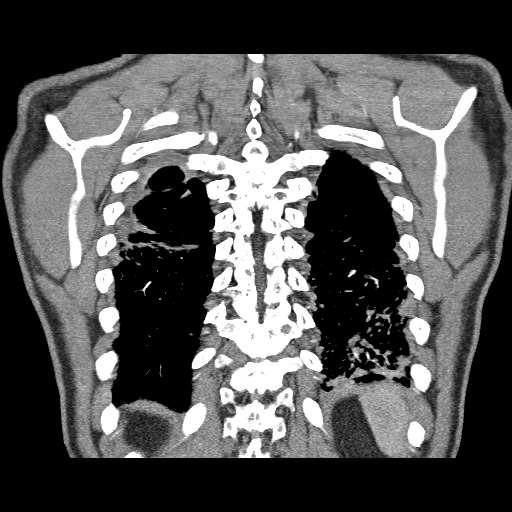

[19 of 46 positions shown; findings below may reference images not displayed]

FINDINGS: No pulmonary embolus. Calcified and noncalcified
mediastinal and hilar lymph nodes are again seen measuring up to 2
cm short axis in the subcarinal station, as before.  No axillary
adenopathy.  Bilateral gynecomastia.  Heart size normal.  No
pericardial effusion.  There are several sub centimeter short axis
juxta diaphragmatic lymph nodes as well.

Confluent perihilar soft tissue masses are seen with traction
bronchiectasis, architectural distortion and perilymphatic
nodularity in the lungs bilaterally.  Distribution and severity are
unchanged.  Interval clearing of previously seen ground-glass air
space disease in the left upper lobe.  Cystic cavity or bullous
lesion in the right upper lobe is unchanged.  No pleural fluid.
Scattered adherent debris in the airway.

Incidental imaging of the upper abdomen shows no acute findings.
No worrisome lytic or sclerotic lesions.
IMPRESSION: 1.  No pulmonary embolus.
2.  Mediastinal, hilar and pulmonary parenchymal changes of
sarcoid, unchanged.

## 2013-05-01 ENCOUNTER — Encounter (HOSPITAL_COMMUNITY)
Admission: RE | Admit: 2013-05-01 | Discharge: 2013-05-01 | Disposition: A | Discharge status: Home / Self Care | Payer: 59 | Source: Ambulatory Visit | Attending: Emergency Medicine | Admitting: Emergency Medicine

## 2013-05-01 NOTE — Progress Notes (Signed)
Today, Carsin exercised at [facility]. Service time was from 1345 to 1515.  The patient exercised for more than 31 minutes performing aerobic, strengthening, and stretching exercises. Oxygen saturation, heart rate, blood pressure, rate of perceived exertion, and shortness of breath were all monitored before, during, and after exercise. Thorsten presented with no problems at today's exercise session.   There was 2 workload changes during today's exercise session.  Pre-exercise vitals:   Weight kg: 77.8   Liters of O2: 2   SpO2: 99   HR: 107   BP: 116/54   CBG: NA  Exercise vitals:   Highest heartrate:  108   Lowest oxygen saturation: 94   Highest blood pressure: 116/64   Liters of 02: 2  Post-exercise vitals:   SpO2: 97   HR: 89   BP: 118/64   Liters of O2: 2   CBG: NA Dr. Kalman ShanMurali Ramaswamy, Medical Director Dr. David StallFeliz-Ortiz is immediately available during today's Pulmonary Rehab session for Andris Baumannommy Barman on 05/01/2013 at 1:30 pm class time.

## 2013-05-03 ENCOUNTER — Encounter (HOSPITAL_COMMUNITY)
Admission: RE | Admit: 2013-05-03 | Discharge: 2013-05-03 | Disposition: A | Discharge status: Home / Self Care | Payer: 59 | Source: Ambulatory Visit | Attending: Emergency Medicine | Admitting: Emergency Medicine

## 2013-05-03 NOTE — Progress Notes (Signed)
Today, Marqual exercised at [facility]. Service time was from 1400 to 1420.  The patient exercised for more than 31 minutes performing aerobic, strengthening, and stretching exercises. Oxygen saturation, heart rate, blood pressure, rate of perceived exertion, and shortness of breath were all monitored before, during, and after exercise. Ciaran presented with no problems at today's exercise session. Bert attended the medication lecture class, he did not stay for the exercise session, but performed the strength training.  There was no workload change during today's exercise session.  Pre-exercise vitals:   Weight kg: 78.1   Liters of O2: 2   SpO2: 100   HR: 86   BP: 108/60   CBG: NA  Exercise vitals:   Highest heartrate:  NA   Lowest oxygen saturation: NA   Highest blood pressure: NA   Liters of 02: NA  Post-exercise vitals:   SpO2: 94   HR: 97   BP: 126/80   Liters of O2: 2   CBG: NA Dr. Kalman ShanMurali Ramaswamy, Medical Director Dr. Malachi BondsShort is immediately available during today's Pulmonary Rehab session for Andris Baumannommy Agyeman on 05/03/2013 at 1:30 pm class time.

## 2013-05-04 ENCOUNTER — Ambulatory Visit: Payer: 59 | Admitting: Emergency Medicine

## 2013-05-10 ENCOUNTER — Encounter (HOSPITAL_COMMUNITY)
Admission: RE | Admit: 2013-05-10 | Discharge: 2013-05-10 | Disposition: A | Discharge status: Home / Self Care | Payer: 59 | Source: Ambulatory Visit | Attending: Emergency Medicine | Admitting: Emergency Medicine

## 2013-05-10 IMAGING — CT CT PELVIS W/ CM
1 series · 15 of 32 positions shown, 19 images · IV contrast (omnipaque)
Comparison: None.

CLINICAL DATA: Rectal pain, shortness of breath

CT PELVIS WITH CONTRAST
TECHNIQUE: Multidetector CT imaging of the pelvis was performed
using the standard protocol following the bolus administration of
intravenous contrast.
Contrast: 50mL OMNIPAQUE IOHEXOL 300 MG/ML  SOLN, 100mL OMNIPAQUE
IOHEXOL 300 MG/ML  SOLN

[Series 2: pelvis with · axial · 0.72mm/px · z∈[+1040,+1304]mm · 15 of 59 slices shown, 19 images]
[im 4/59  soft-tissue]
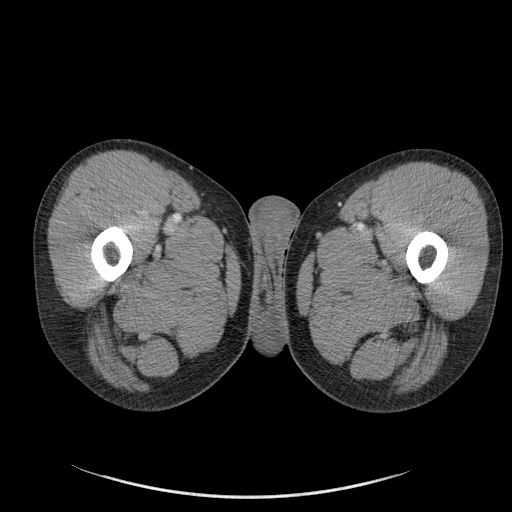
[im 4/59  bone]
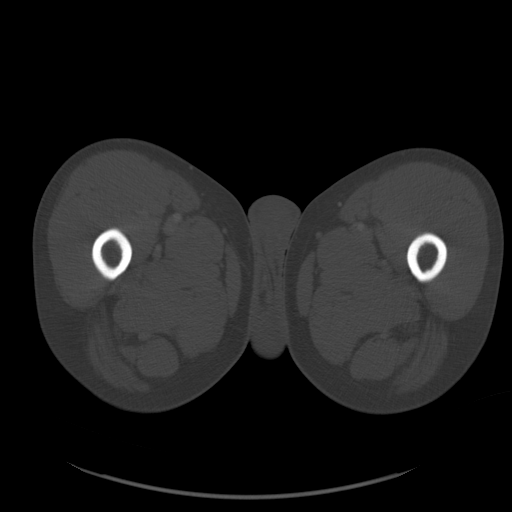
[im 8/59  soft-tissue]
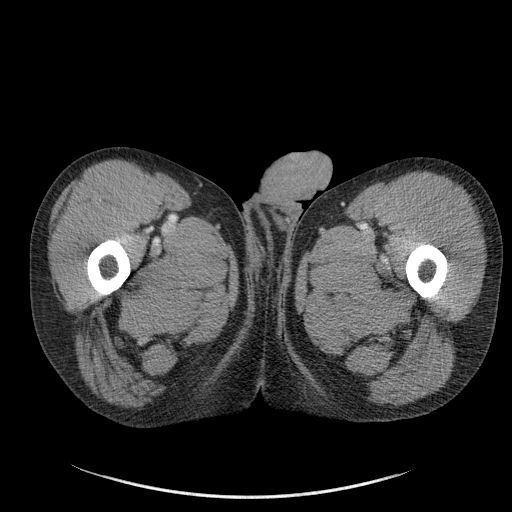
[im 12/59  soft-tissue]
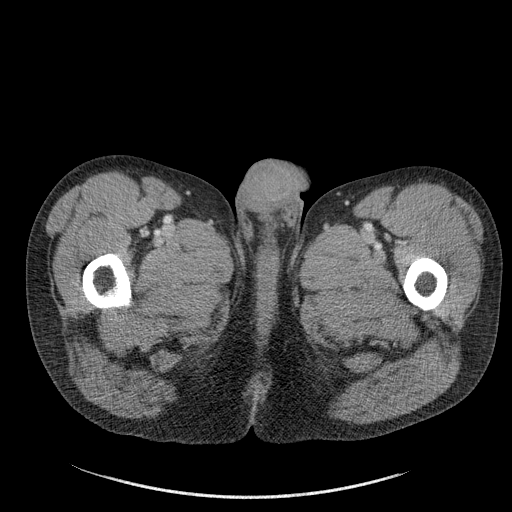
[im 17/59  soft-tissue]
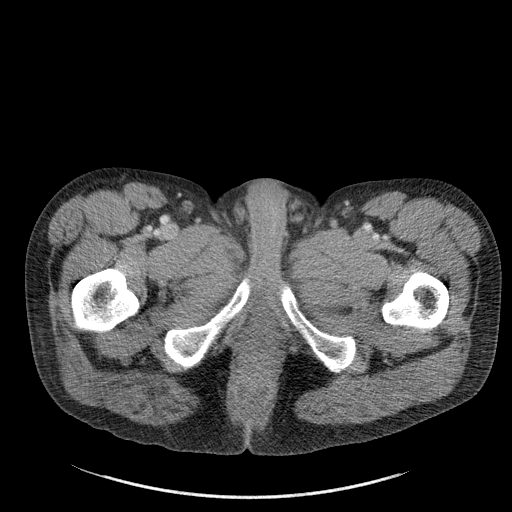
[im 21/59  soft-tissue]
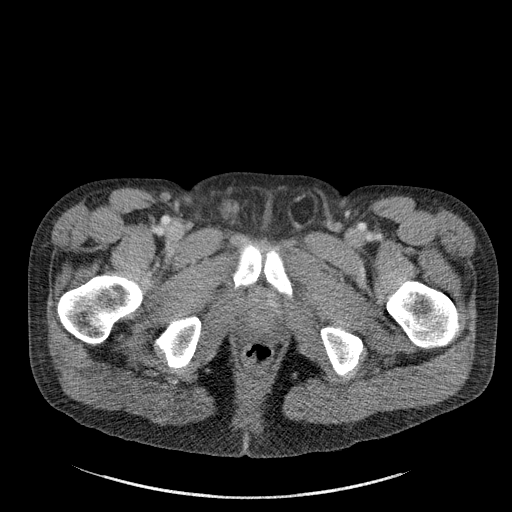
[im 25/59  soft-tissue]
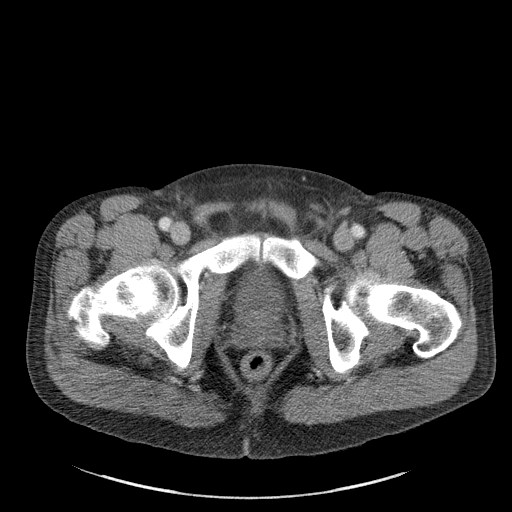
[im 30/59  soft-tissue]
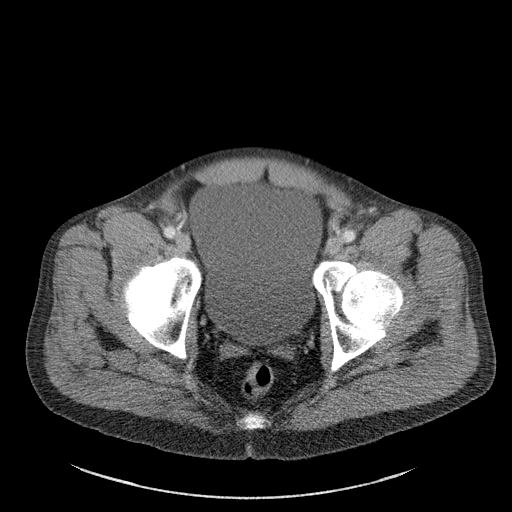
[im 34/59  soft-tissue]
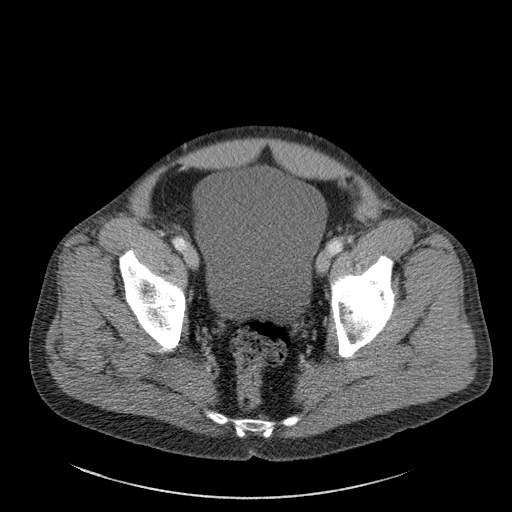
[im 38/59  soft-tissue]
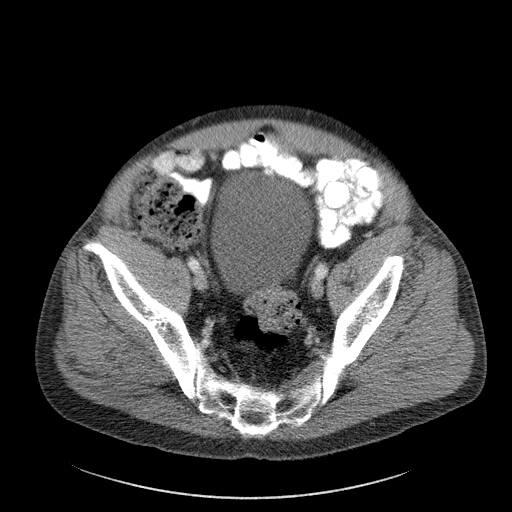
[im 38/59  bone]
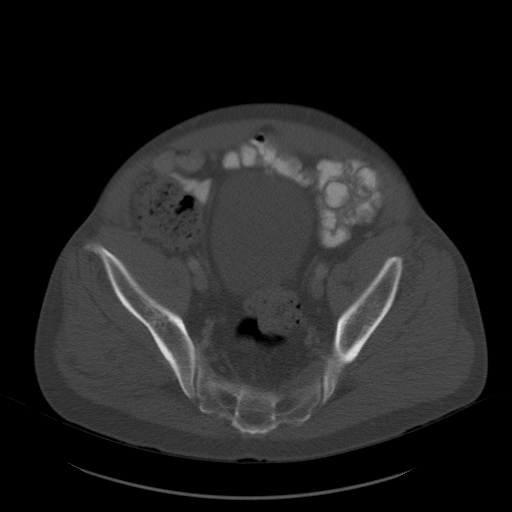
[im 42/59  soft-tissue]
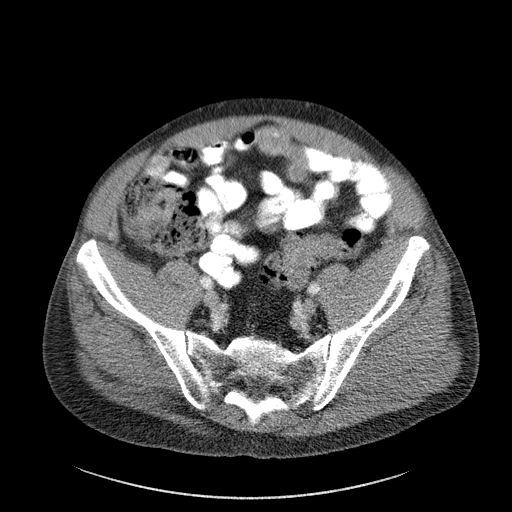
[im 47/59  soft-tissue]
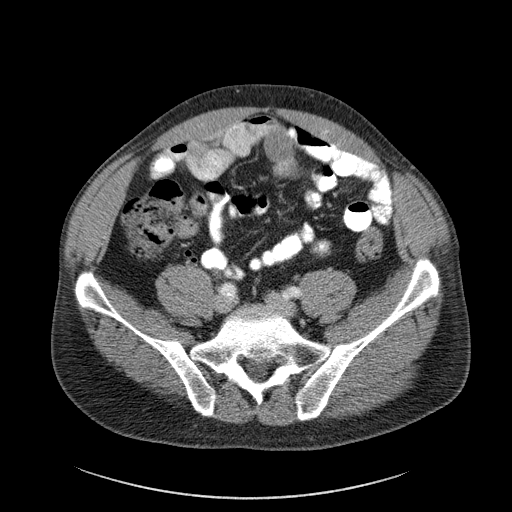
[im 51/59  soft-tissue]
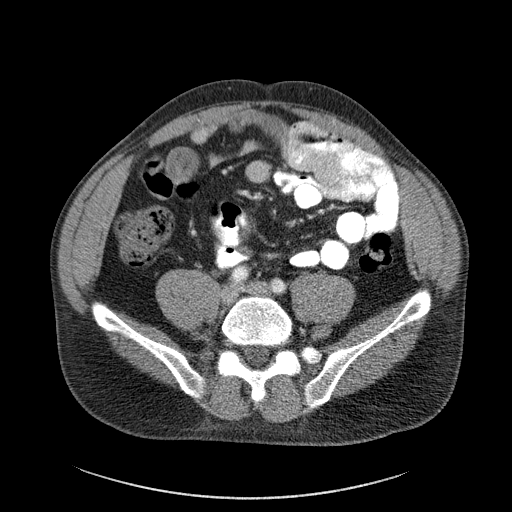
[im 51/59  lung]
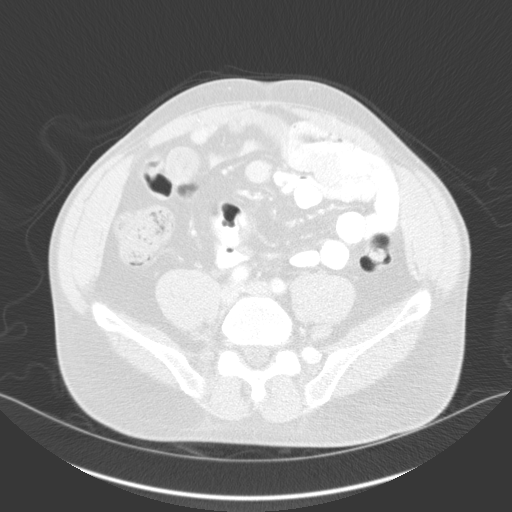
[im 53/59  lung]
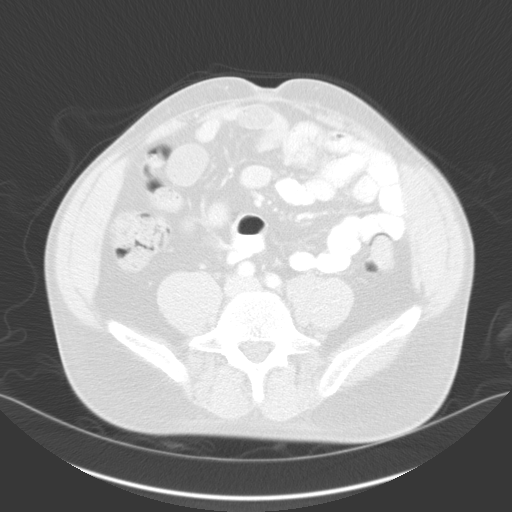
[im 55/59  soft-tissue]
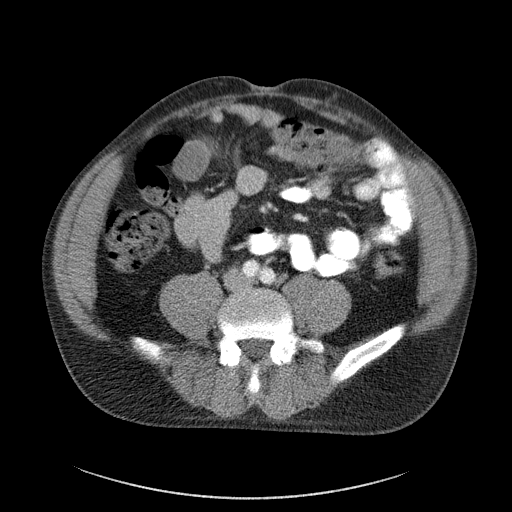
[im 55/59  lung]
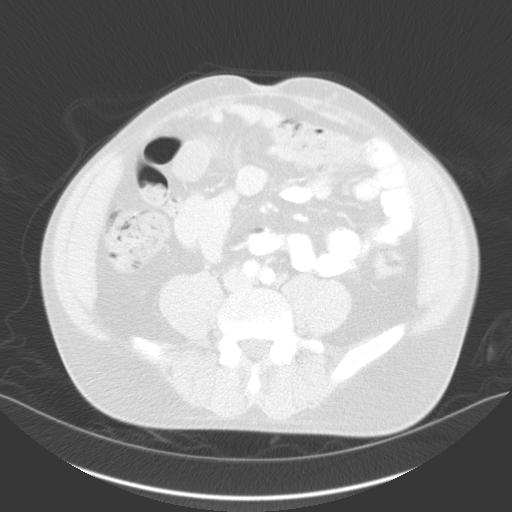
[im 57/59  lung]
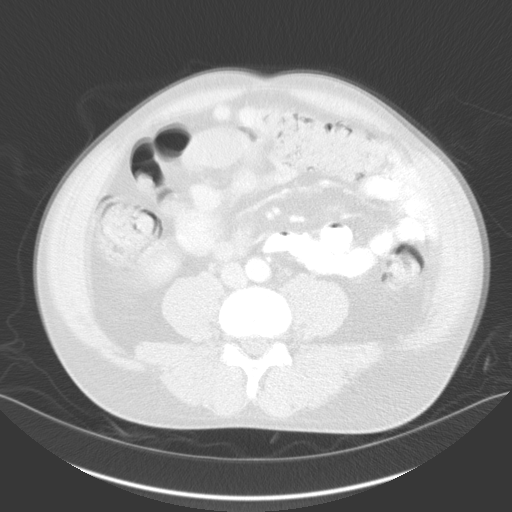

[15 of 32 positions shown; findings below may reference images not displayed]

FINDINGS: Normal-caliber large and small bowel throughout the
visualized lower abdomen and anatomic pelvis.  A few scattered
colonic diverticula are noted.  There is a moderate volume of stool
in the visualized colon.  No intra-abdominal or pelvic ascites.
The normal appendix can be identified in the right lower quadrant.
The bladder is distended with urine.  Unremarkable prostate gland
and seminal vesicles.

Nonspecific soft tissue fullness at the anal verge measures up to
3.4 cm in width.  No focal fluid collection to suggest abscess.  No
significant inflammatory change in the ischiorectal fossae
bilaterally.  No acute osseous abnormality.  No significant
atherosclerotic vascular disease in the visualized vessels.
IMPRESSION: 1.  Nonspecific soft tissue fullness at the anal verge may
represent hemorrhoidal tissue, or potentially a soft tissue mass.
This should be amenable to direct inspection.  No focal fluid
collection to suggest abscess and no adjacent inflammatory
stranding in the ischiorectal fossae.

2.  Moderate volume of formed stool throughout the colon may
suggest an element of underlying constipation.

3.  No evidence of bowel obstruction in the visualized lower
abdomen and anatomic pelvis.

## 2013-05-10 IMAGING — CR DG CHEST 2V
2 series · 2 of 2 positions shown · non-contrast
Comparison: CT 02/14/2012.  Radiographs 02/13/2012.

CLINICAL DATA: Sarcoidosis.  Shortness of breath.

CHEST - 2 VIEW

[w chest pa]
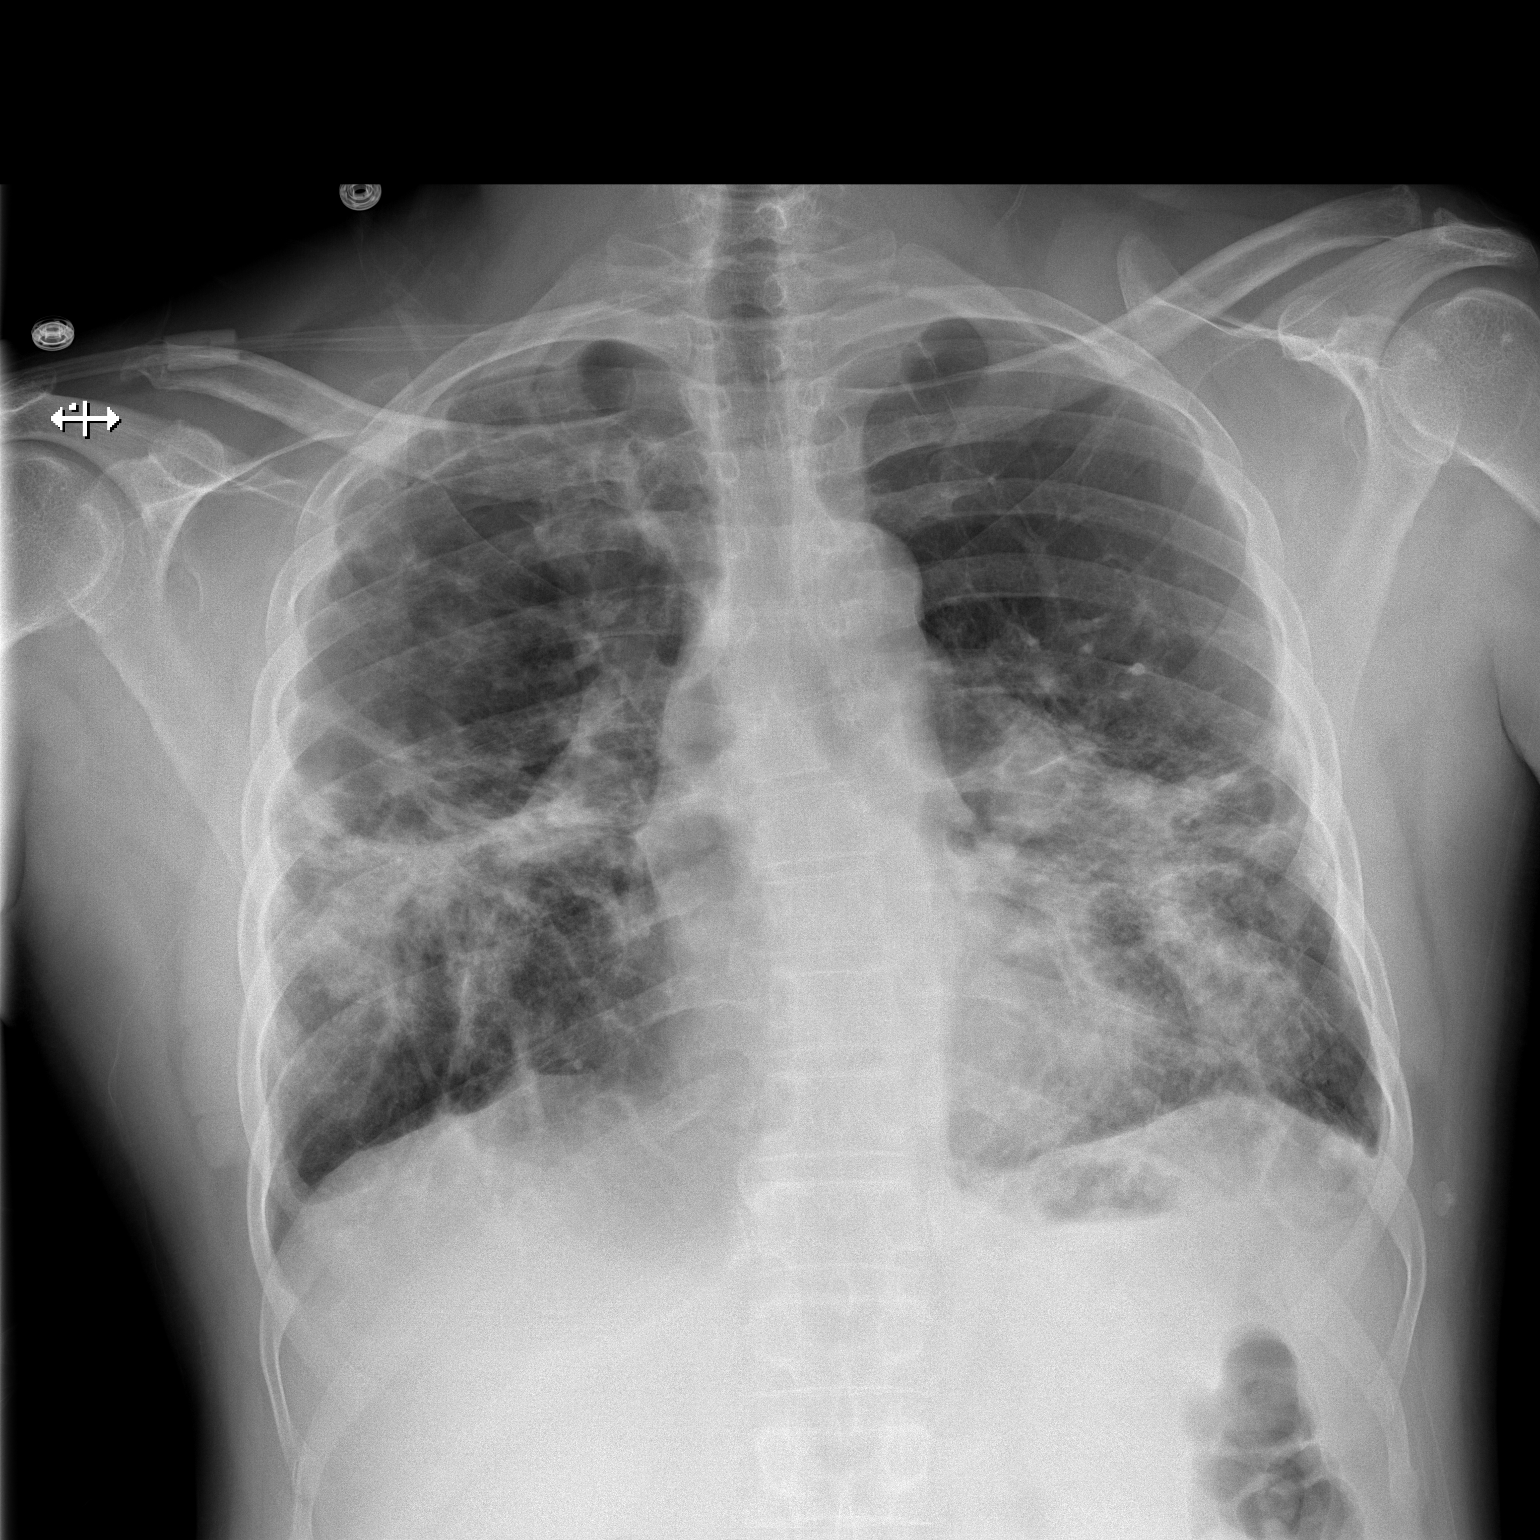

[w chest lat]
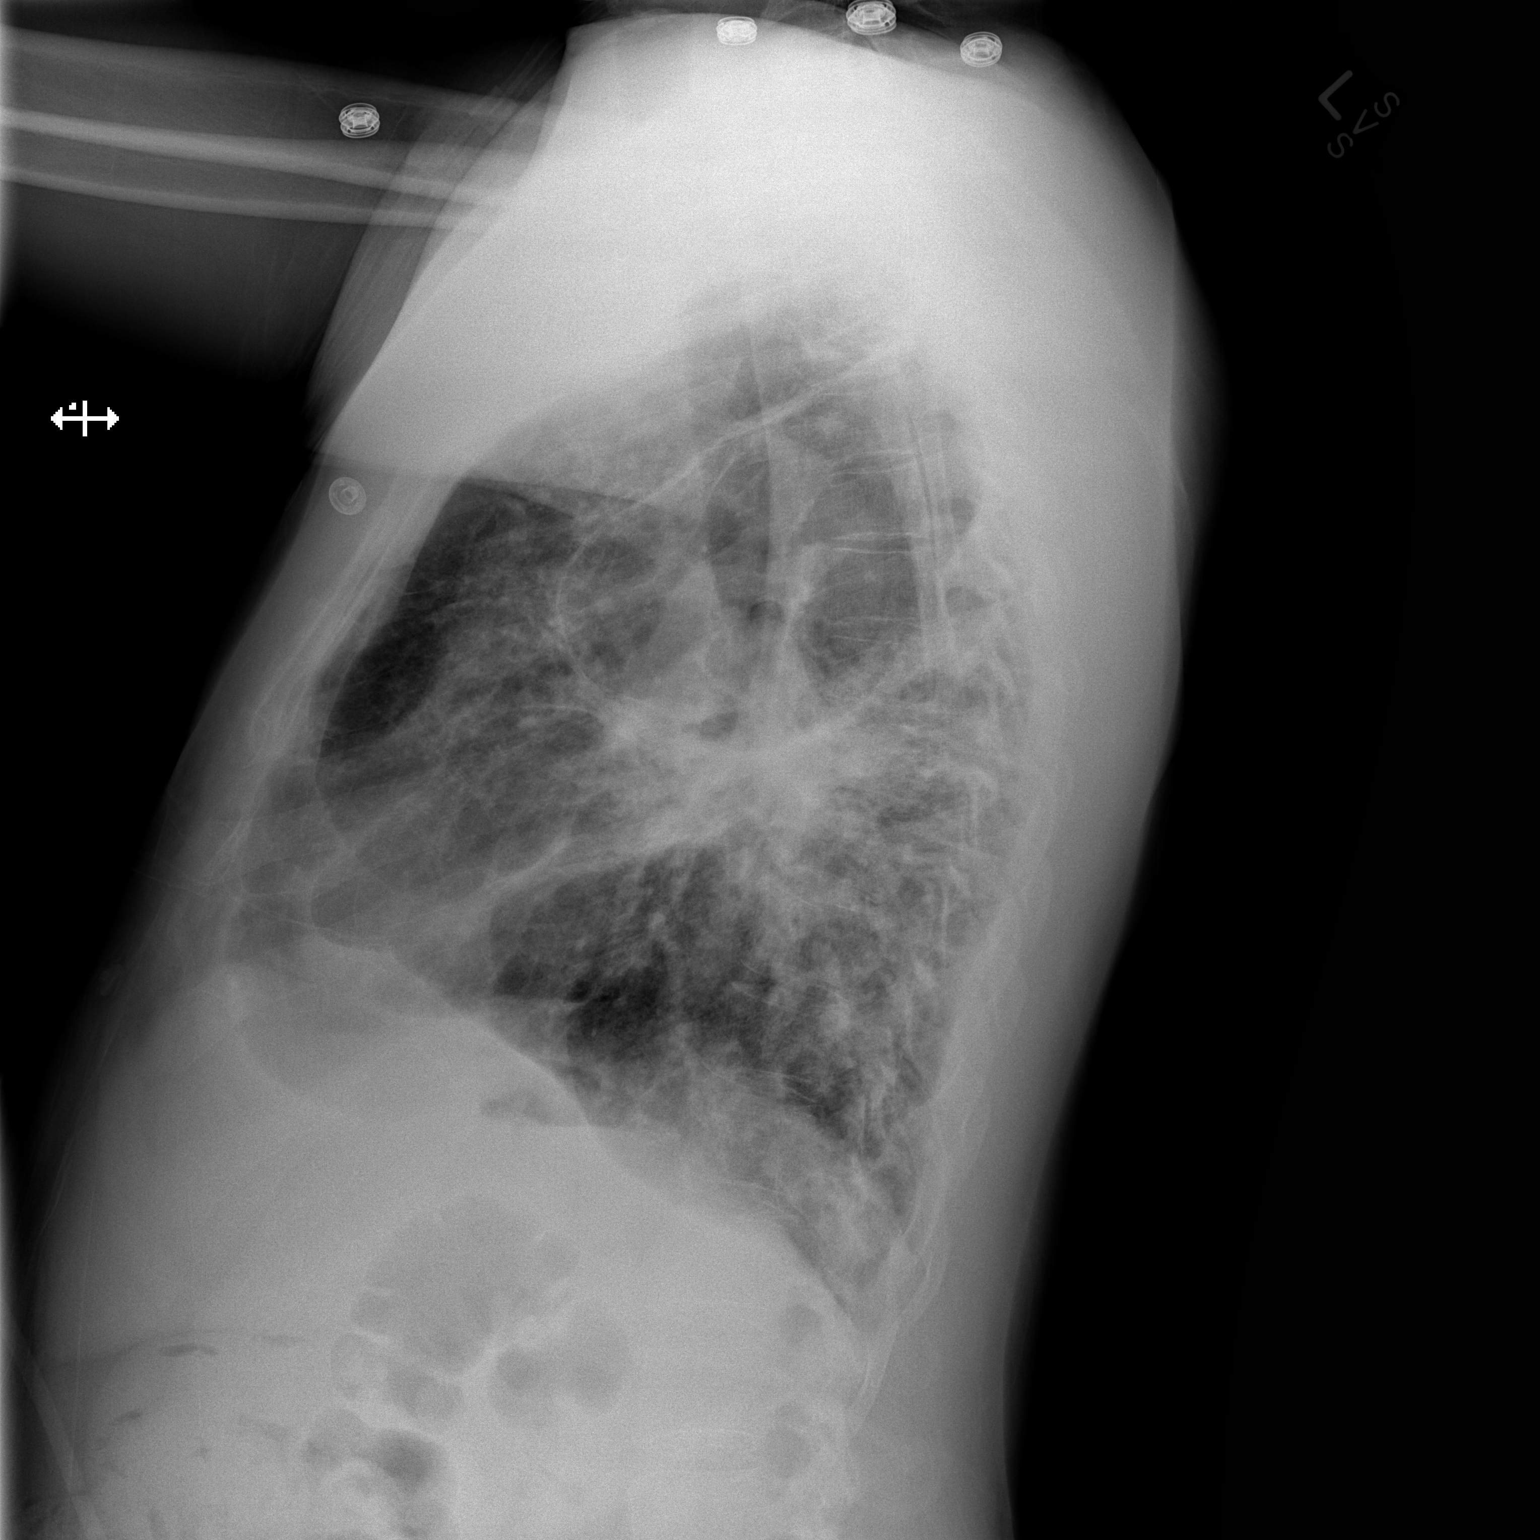

[2 of 2 positions shown; findings below may reference images not displayed]

FINDINGS: The heart size and mediastinal contours are stable.
There is extensive chronic lung disease with chronic perihilar
pulmonary opacities bilaterally.  Based on the lateral view, there
may be slight contraction of this process, although this difference
may be positional.  There is no new airspace disease, significant
pleural effusion or pneumothorax.
IMPRESSION: Grossly stable extensive chronic lung disease consistent with
chronic sarcoidosis.  No acute process identified.

## 2013-05-10 NOTE — Progress Notes (Signed)
Today, [name] exercised at [facility]. Service time was from 1:30pm to 3:30pm.  The patient exercised for more than 31 minutes performing aerobic, strengthening, and stretching exercises. Oxygen saturation, heart rate, blood pressure, rate of perceived exertion, and shortness of breath were all monitored before, during, and after exercise. [name] presented with no problems at today's exercise session. The patient attended "Exercise for the Pulmonary Patient" education class today during rehab.  There was no workload change during today's exercise session.  Pre-exercise vitals:   Weight kg: 76.4   Liters of O2: 2l   SpO2: 95   HR: 104   BP: 102/70   CBG: na  Exercise vitals:   Highest heartrate:  123   Lowest oxygen saturation: 91   Highest blood pressure: 134/80   Liters of 02: 2  Post-exercise vitals:   SpO2: 94   HR: 111   BP: 120/82   Liters of O2: 2l   CBG: na  Dr. Kalman ShanMurali Ramaswamy, Medical Director Dr. David StallFeliz-Ortiz is immediately available during today's Pulmonary Rehab session for [name] on 05/10/13 at 1:30pm class time.

## 2013-05-15 ENCOUNTER — Ambulatory Visit (INDEPENDENT_AMBULATORY_CARE_PROVIDER_SITE_OTHER): Payer: 59 | Admitting: Adult Health

## 2013-05-15 ENCOUNTER — Encounter: Payer: Self-pay | Admitting: Adult Health

## 2013-05-15 ENCOUNTER — Ambulatory Visit (INDEPENDENT_AMBULATORY_CARE_PROVIDER_SITE_OTHER)
Admission: RE | Admit: 2013-05-15 | Discharge: 2013-05-15 | Disposition: A | Discharge status: Home / Self Care | Payer: 59 | Source: Ambulatory Visit | Attending: Adult Health | Admitting: Adult Health

## 2013-05-15 ENCOUNTER — Encounter (HOSPITAL_COMMUNITY)
Admission: RE | Admit: 2013-05-15 | Discharge: 2013-05-15 | Disposition: A | Discharge status: Home / Self Care | Payer: 59 | Source: Ambulatory Visit | Attending: Emergency Medicine | Admitting: Emergency Medicine

## 2013-05-15 VITALS — BP 124/84 | HR 85 | Temp 98.6°F | Ht 67.5 in | Wt 169.0 lb

## 2013-05-15 DIAGNOSIS — D869 Sarcoidosis, unspecified: Secondary | ICD-10-CM

## 2013-05-15 DIAGNOSIS — I4892 Unspecified atrial flutter: Secondary | ICD-10-CM | POA: Insufficient documentation

## 2013-05-15 DIAGNOSIS — J471 Bronchiectasis with (acute) exacerbation: Secondary | ICD-10-CM

## 2013-05-15 DIAGNOSIS — J9610 Chronic respiratory failure, unspecified whether with hypoxia or hypercapnia: Secondary | ICD-10-CM | POA: Insufficient documentation

## 2013-05-15 DIAGNOSIS — Z5189 Encounter for other specified aftercare: Secondary | ICD-10-CM | POA: Insufficient documentation

## 2013-05-15 MED ORDER — CIPROFLOXACIN HCL 750 MG PO TABS: 750.0000 mg | ORAL_TABLET | Freq: Two times a day (BID) | ORAL | Status: AC

## 2013-05-15 MED ORDER — PREDNISONE 10 MG PO TABS: ORAL_TABLET | ORAL | Status: DC

## 2013-05-15 MED ORDER — LEVALBUTEROL HCL 0.63 MG/3ML IN NEBU
0.6300 mg | INHALATION_SOLUTION | Freq: Once | RESPIRATORY_TRACT | Status: AC
  Administered 2013-05-15: 0.63 mg via RESPIRATORY_TRACT

## 2013-05-15 NOTE — Assessment & Plan Note (Addendum)
Flare  CXR w/ no acute process Xopenex x 1 in office    Plan  Cipro 750mg  two times a day for 7 days Prednisone taper over next week.  Can take Mucinex DM Twice daily   Follow up Dr. Delton CoombesByrum  In 2 weeks as planned and As needed   Please contact office for sooner follow up if symptoms do not improve or worsen or seek emergency care

## 2013-05-15 NOTE — Patient Instructions (Addendum)
Cipro 750mg  two times a day for 7 days Prednisone taper over next week.  Can take Mucinex DM Twice daily   Follow up Dr. Delton CoombesByrum  In 2 weeks as planned and As needed   Please contact office for sooner follow up if symptoms do not improve or worsen or seek emergency care    Late add:  Unable to afford Cipro 750, rx for 500mg  Twice daily  rx x 10d

## 2013-05-15 NOTE — Addendum Note (Signed)
Addended by: Boone MasterJONES, Ibraheem Voris E on: 05/15/2013 04:09 PM   Modules accepted: Orders

## 2013-05-15 NOTE — Progress Notes (Signed)
Apolo arrived for exercise session in pulmonary rehab, but complained of shortness of breath more than usual for 2 days, yellow mucus, and abdominal pain.  I called Dr. Kavin LeechByrum's office and was able to get an appointment for him with Enis Slipperammy Parrot, NP today @ 2:45 pm.  Patient did not exercise and was sent for this appointment.

## 2013-05-15 NOTE — Progress Notes (Signed)
Subjective:   Patient ID: [name], male    DOB: 11/08/1965, 48 y.o.   MRN: [id number]  HPI 48 yo man, never smoker, hx sarcoidosis dx by transbronchial bx's in 2002 and again in 2011 or 2012, also OSA on CPAP. He has occasional flares that sound asthmatic in nature, gets treated with prednisone. He flares typically a few times a year, but he has also gone for over a year without an exacerbation. He has never been on everyday meds, has been on pred with exacerbations - longest he was on it was 90 days. He has also been on a steroid-sparing agent before (?MTX), but it was stopped. He has a large cavity in R lung, scattered infiltrates. He has been on Advair before, Spiriva before, not currently. Has albuterol available prn, uses almost every day. [facility] P[facility] were last year at [facility].   ROV 01/06/12 -- sarcoidosis, R lung cavitary disease. Was recently hospitalized for an acute exacerbation. D/c after pred taper. Last CT scan was 4/13. He is using nebs 2 -3 x a day.   04/06/12  Post Hospital follow up  Patient presents for a post hospital followup. Patient was recently admitted with acute bronchiectasis, exacerbation, complicated by underlying sarcoidosis. Patient has had several recent hospitalizations and emergency room visits.   He was treated with aggressive pulmonary hygiene, antibiotics, and a steroid taper. Since discharge pt reports breathing is doing well overall but still wheezing, SOB, head congestion and prod cough with clear  Mucus .  Patient denies any hemoptysis, orthopnea, PND, or leg swelling. During admission. Patient has had cocaine, positive urine drug screen in his past. He did receive a social work consult. Patient denies any drug use at today's visit.  He was discharged on prednisone to recommend to hold at 20 mg however, patient misunderstood instructions and has stopped all steroids.  ROV 05/18/12 -- sarcoidosis, bronchiectasis and R cavitary disease. Regular f/u visit. Has  been doing well, some intermittent SOB. Taking allegra and benadryl. He is on QVAR daily, off prednisone. Uses SABA rarely. He is reliable w CPAP.   ROV 06/27/12 -- sarcoidosis, bronchiectasis and R cavitary disease. Also OSA, HTN w diastolic dysfxn.  Was admitted for flare obstructive lung disease/sarcoidosis 6/6 - 6/8. He underwent repeat CT scan chest today >> stable bullous and parenchymal changes compared with 02/2012. He feels back to baseline clinically. Having some cough, clear phlegm. On flonase, allegra and benadryl.   post hospital followup. Patient returns for a post hospital followup. Patient had a complicated hospital course, which he was admitted June 30, and discharged on 08/04/2012. Patient was admitted with acute respiratory failure with a left tension pneumothorax requiring mechanical ventilation. Patient had a persistent left pneumothorax with subcutaneous emphysema around his chest tube. He was seen by thoracic surgery and underwent a left video-assisted thoracoscopy on July 7 with stapling of apical blebs and mechanical thoracentesis. Pt cont w/ dyspnea/fevers.  CT scan on July 20 showed a, moderate left pleural effusion with loculation anteriorly/? HCAP >Underwent thoracentesis was done with 90 cc removed. Analysis was consistent with an exudative effusion and his culture data was negative. He was treated with abx and discharged on Levaquin to complete a ten-day course. And steroid taper-which he has a  few days left.  He was seen by thoracic surgery yesterday.chest x-ray showed a mild right apical pneumothorax. That is unchanged from previous exam.   Since discharge he is feeling better but still very weak.  Feels  breathing is  doing well overall.  does report some pain at the incision site and in the left lung.  finished levaquin yesterday, still taking prednisone at 3tabs daily. No hemoptysis , orthopnea or edema.   ROV 09/15/12 - sarcoidosis, bronchiectasis and R cavitary disease,  complicated hospitalization for PTX as above. Hasn't been using his CPAP in order to avoid positive pressure following his pneumothorax. He was seen 8/21 and treated with levofloxacin and prednisone for bronchitis versus pneumonia. He needs his portable concentrator, [employer] doesn't provide. He also [facility] get [facility], needs [facility]. Uses albuterol prn, about once a day. Pulmicort bid.   ROV 01/17/13 -- sarcoidosis, bronchiectasis and R cavitary disease, OSA back on CPAP. Hx PTX on L. He began to have an exacerbation end of Dec > treated with pred taper and now improved. He is using albuterol nebs prn, pulmicort nebs bid.   ROV 02/28/13 -- sarcoidosis, bronchiectasis and R cavitary disease, OSA back on CPAP. I have sent him for transplant eval, not a candidate due to positive drug screen for cocaine. He presents today with more dyspnea. Chills for 3-4 days. He has cough with yellow/green.   ROV 03/28/13 -- sarcoidosis, bronchiectasis and R cavitary disease. OSA on CPAP. He has some numbness and pain in his L chest and flank. His breathing and coughing are better. He is interested in changing to inhalers instead of nebs.    Acute OV 05/15/13  Complains of increased  SOB, wheezing, tightness, prod cough with yellow mucus x3 days.  Denies f/c/s, hemoptysis, nausea, vomiting, orthopnea, edema , or rash, chest pain  No recent travel or abx use.  Remains on Symbicort Twice daily  Off all steroids .  Says he has not used cocaine >3 months, drug counseling given.  CXR today with no acute process., chronic changes      ROS:  Constitutional:   No  weight loss, night sweats,  Fevers, chills,  +fatigue, or  lassitude.  HEENT:   No headaches,  Difficulty swallowing,  Tooth/dental problems, or  Sore throat,                No sneezing, itching, ear ache,  +nasal congestion, post nasal drip,   CV:  No chest pain,  Orthopnea, PND, swelling in lower extremities, anasarca, dizziness,  palpitations, syncope.   GI  No heartburn, indigestion, abdominal pain, nausea, vomiting, diarrhea, change in bowel habits, loss of appetite, bloody stools.   Resp:    No chest wall deformity  Skin: no rash or lesions.  GU: no dysuria, change in color of urine, no urgency or frequency.  No flank pain, no hematuria   MS:  No joint pain or swelling.  No decreased range of motion.  No back pain.  Psych:  No change in mood or affect. No depression or anxiety.  No memory loss.      Objective:   Physical Exam   Gen: Pleasant,  normal affect, NAD  ENT: No lesions,  mouth clear,  oropharynx clear, no postnasal drip  Neck: No JVD, no TMG, no carotid bruits  Lungs:  Diminshed BS in bases, no wheezes  Cardiovascular: RRR, heart sounds normal, no murmur or gallops, no peripheral edema  Musculoskeletal: No deformities, no cyanosis or clubbing  Neuro: alert, non focal  Skin: Warm, no lesions or rashes    CXR 05/15/2013  Chronic changes consistent with the given clinical history. No acute abnormality is noted.   CXR 08/09/12 Mild right apical  pneumothorax is noted which is unchanged compared . Stable bilateral lung opacities compared to prior exam consistent with history of sarcoidosis.   CT chest 07/30/12 >Increase in a small to moderate left pleural effusion since  most recent examination. Component along the anterior chest wall  has a lenticular configuration and may be loculated.  2. Perihilar opacities and traction bronchiectasis consistent with  history of sarcoidosis. Extent of opacity in the superior segment  left lower lobe appears somewhat increased since the 06/27/2012  study but not notably changed compared 02/14/12. This could be due  to increased atelectasis or pneumonia.  3. Negative for pneumothorax. Extensive subcutaneous air about  the chest noted.  CT chest 06/27/12 --  Comparison: CTA chest dated 02/14/2012  Findings: 10.1 x 7.8 cm thin-walled  cavitary/bullous lesion in the  posterior right upper lobe (series 4/image 20), unchanged.  Confluent perihilar opacities with traction bronchiectasis and  architectural distortion, related to known sarcoidosis. Associated  peribronchovascular nodularity bilaterally.  On inspiratory / expiratory imaging, there is no evidence of air  trapping.  No superimposed opacities suspicious for pneumonia. No pleural  effusion or pneumothorax.  Heart is normal in size. No pericardial effusion.  Partially calcified mediastinal lymphadenopathy, including:  --1.3 cm short-axis right paratracheal node (series 2/image 17)  --1.0 cm short-axis AP window node (series 2/image 18)  --1.9 cm short-axis subcarinal node (series 2/image 27)  Associated hilar lymphadenopathy is suspected but difficult to  discretely measure in the absence of intravenous contrast  administration. The overall appearance is unchanged.  Visualized upper abdomen is unremarkable.  Visualized osseous structures are within normal limits.       Assessment & Plan:

## 2013-05-16 IMAGING — CR DG CHEST 2V
2 series · 2 of 2 positions shown · non-contrast
Comparison: 02/28/2012

CLINICAL DATA: Shortness of breath.  History of sarcoidosis and
bronchitis.  Nonsmoker.

CHEST - 2 VIEW

[w chest pa]
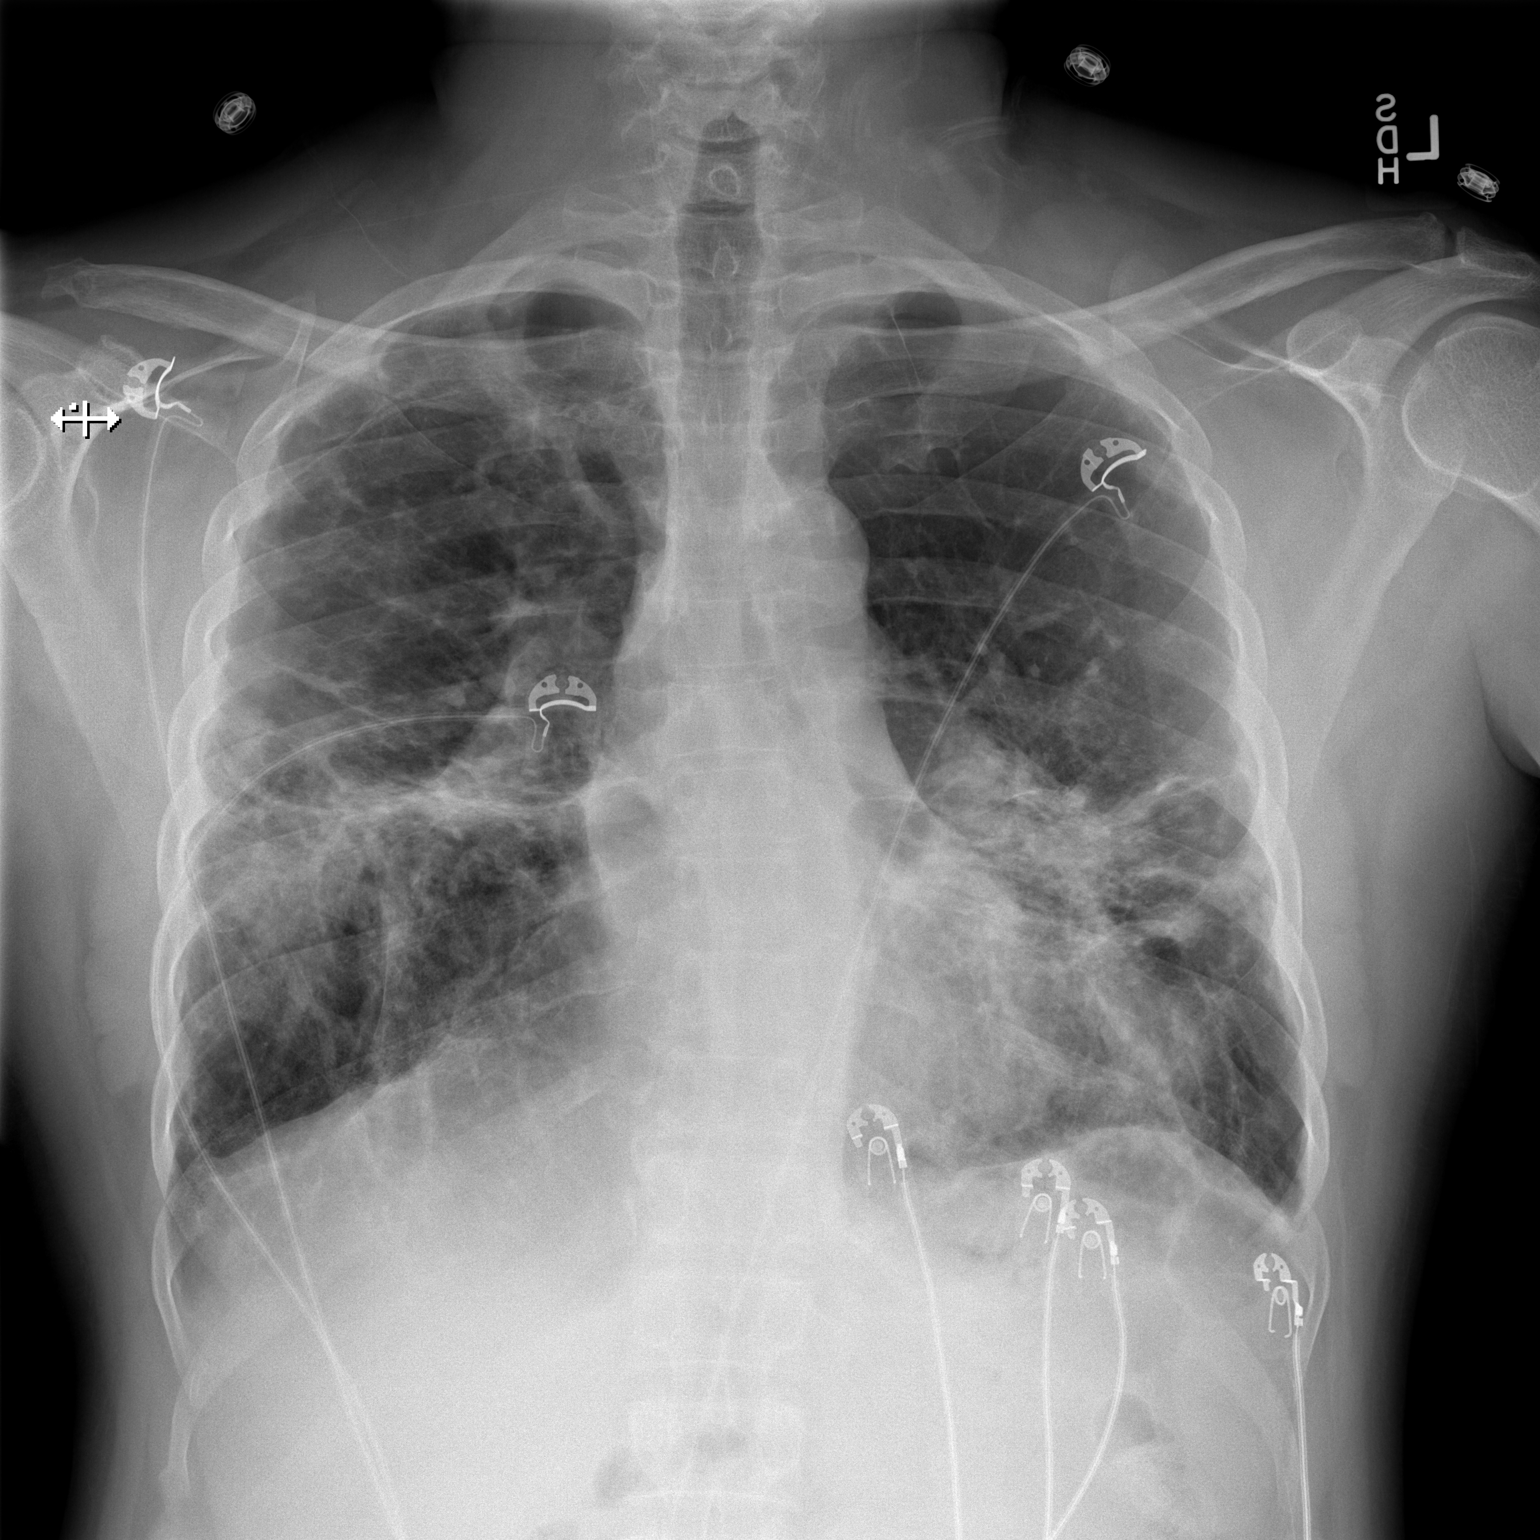

[w chest lat]
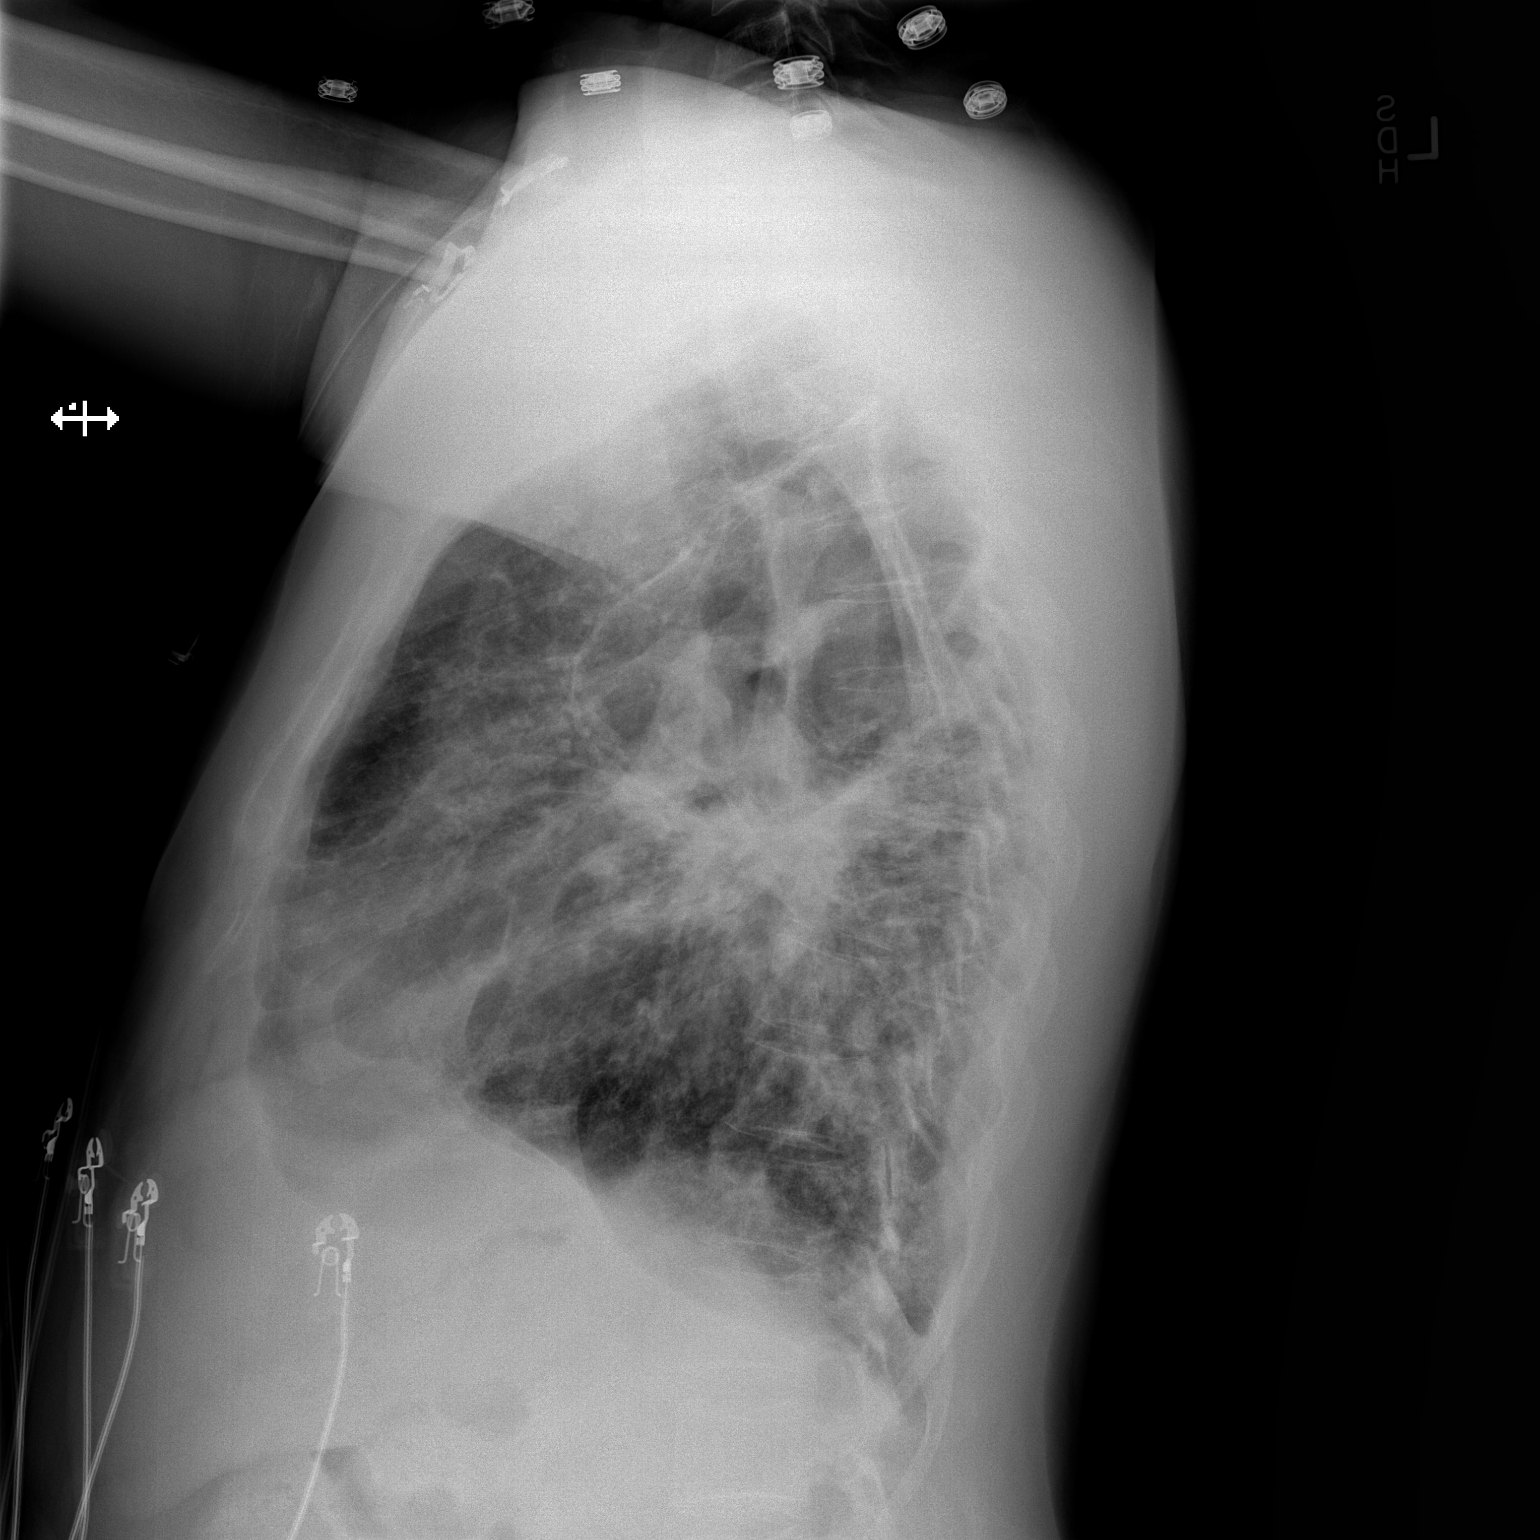

[2 of 2 positions shown; findings below may reference images not displayed]

FINDINGS: Normal heart size and pulmonary vascularity.  Chronic
changes in the lungs with diffuse pulmonary fibrosis most
prominently in the mid and lower lung zones bilaterally.  Bullous
emphysematous changes in the upper lungs.  No definite change since
previous study.  No evidence of developing consolidation or
effusion.  No pneumothorax.
IMPRESSION: Chronic-appearing fibrosis and emphysematous changes in the lungs.
No definite evidence of any acute process.

## 2013-05-17 ENCOUNTER — Encounter (HOSPITAL_COMMUNITY): Payer: 59

## 2013-05-22 ENCOUNTER — Encounter (HOSPITAL_COMMUNITY): Payer: 59

## 2013-05-23 IMAGING — CR DG CHEST 2V
2 series · 2 of 2 positions shown · non-contrast
Comparison: 03/05/2012

CLINICAL DATA: Shortness of breath, leg swelling, sarcoidosis

CHEST - 2 VIEW

[w chest pa]
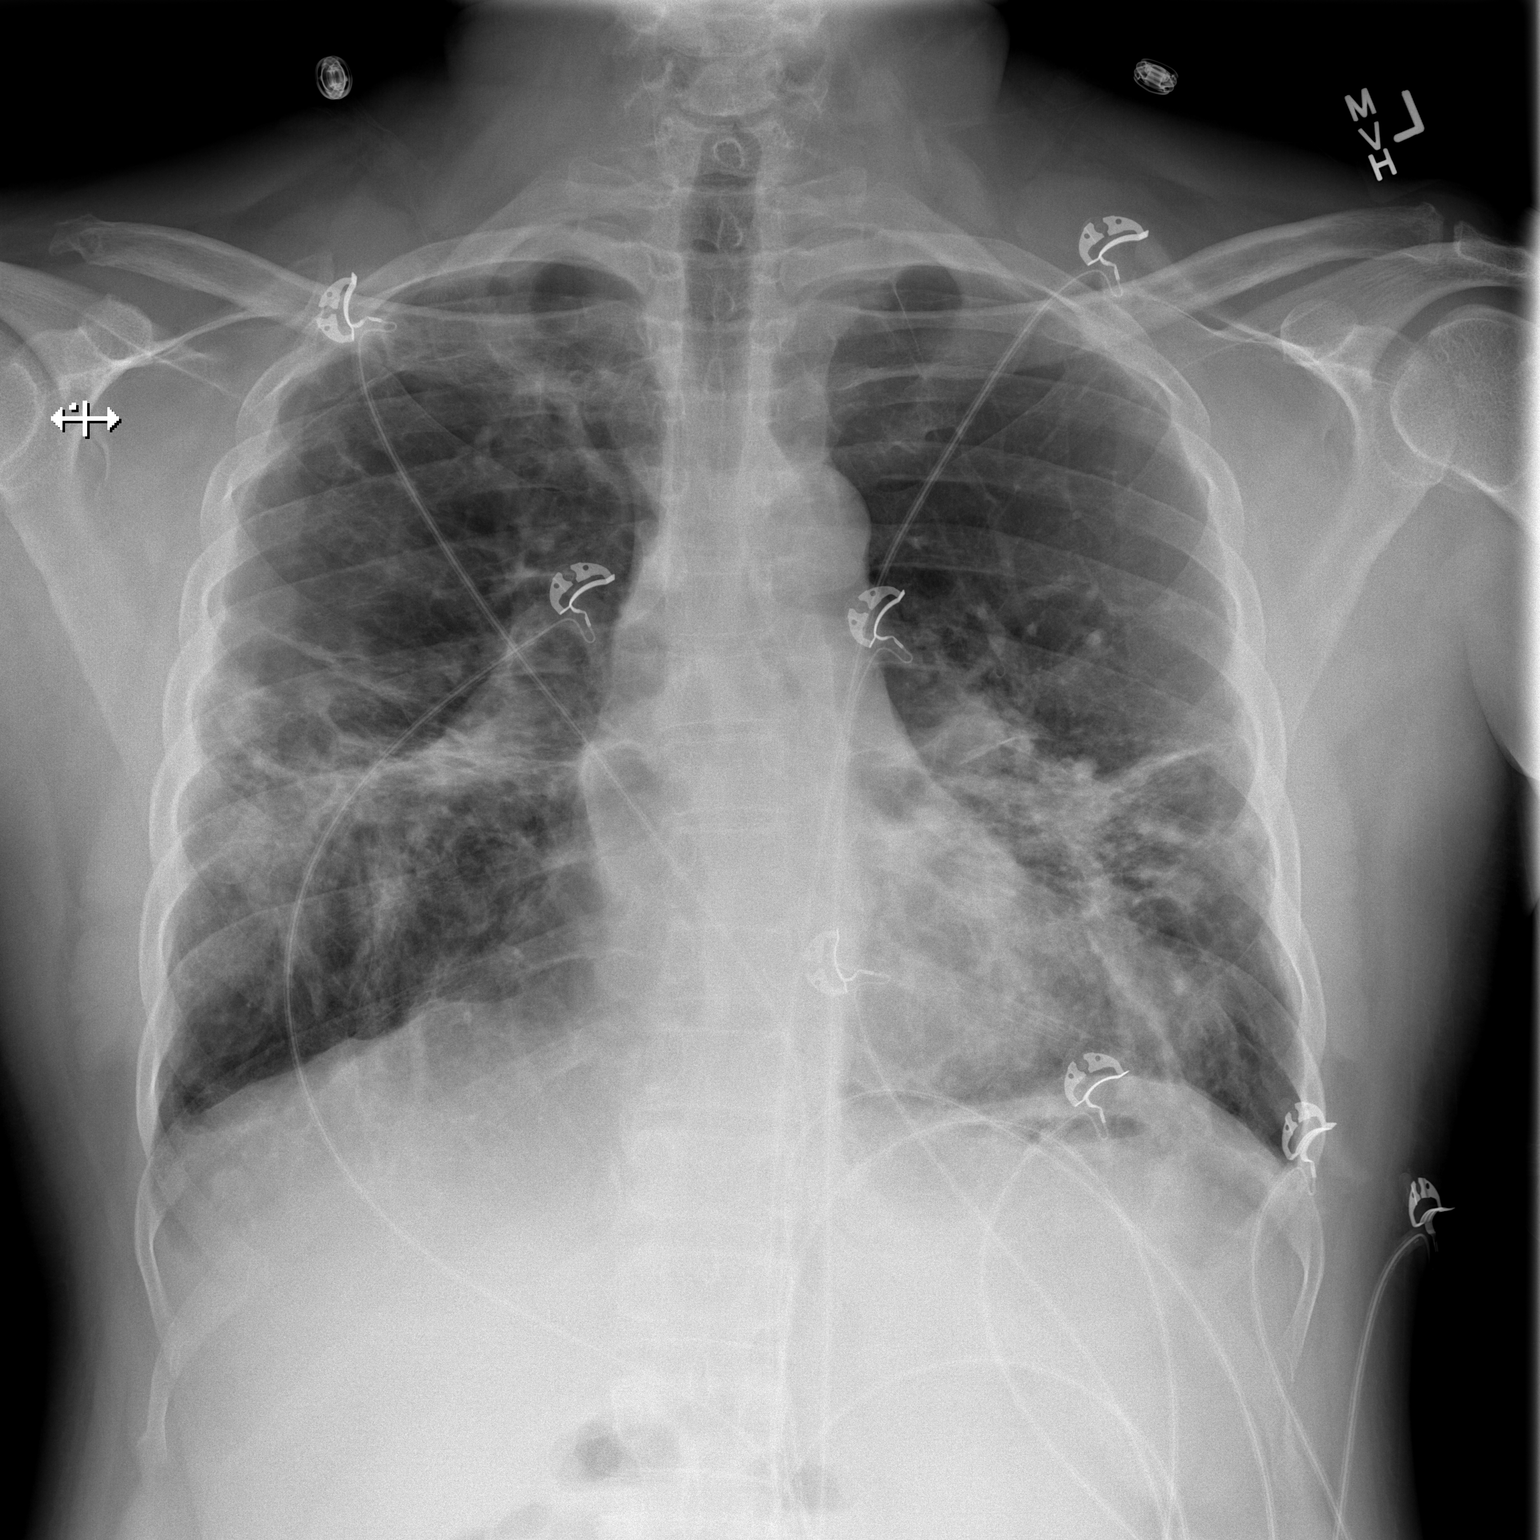

[w chest lat]
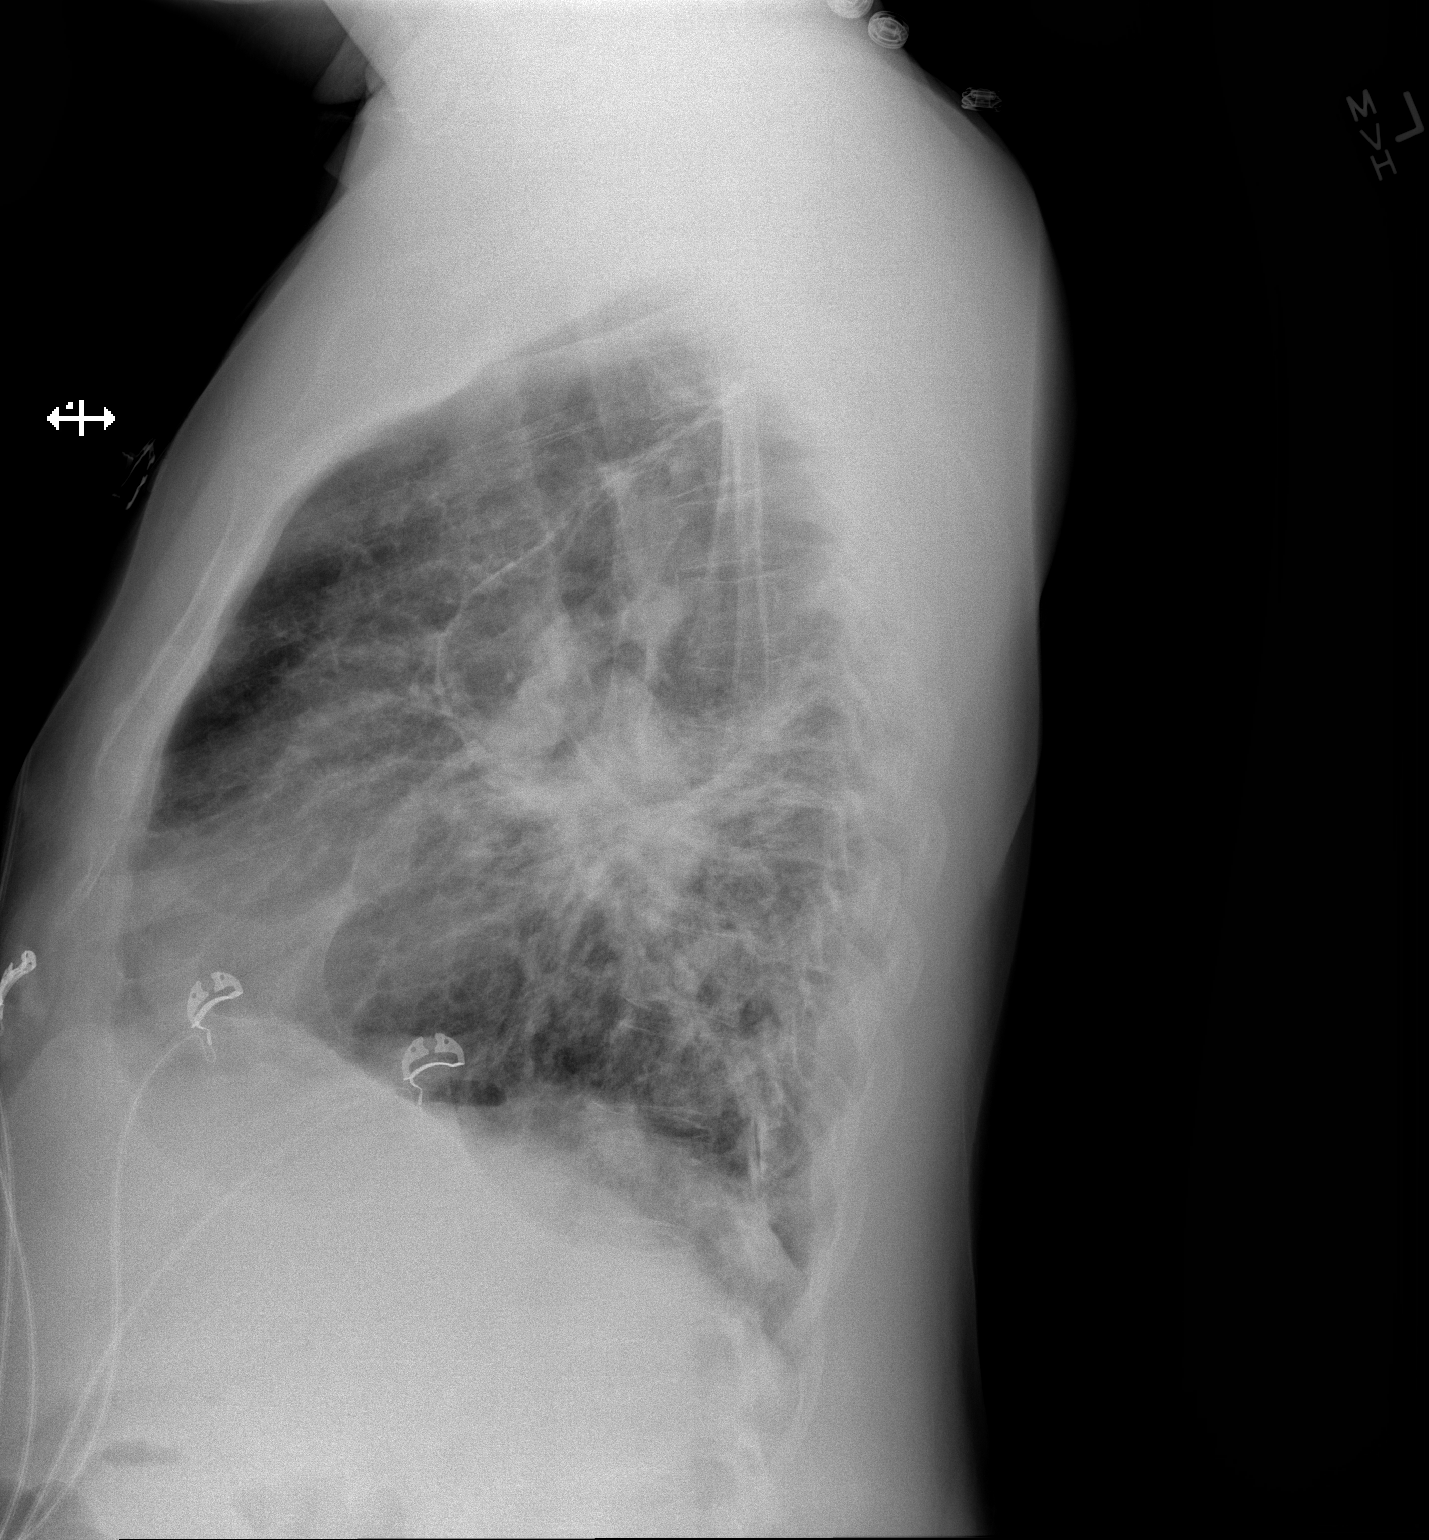

[2 of 2 positions shown; findings below may reference images not displayed]

FINDINGS: Chronic perihilar scarring/fibrosis, unchanged.  No
superimposed opacities suspicious for pneumonia.  No frank
interstitial edema.  No pleural effusion or pneumothorax.

The heart is normal in size.

Mild degenerative changes of the visualized thoracolumbar spine.
IMPRESSION: Chronic perihilar scarring/fibrosis, unchanged, likely related to
known sarcoidosis.

No evidence of acute cardiopulmonary disease.

## 2013-05-24 ENCOUNTER — Encounter (HOSPITAL_COMMUNITY)
Admission: RE | Admit: 2013-05-24 | Discharge: 2013-05-24 | Disposition: A | Discharge status: Home / Self Care | Payer: 59 | Source: Ambulatory Visit | Attending: Emergency Medicine | Admitting: Emergency Medicine

## 2013-05-24 NOTE — Progress Notes (Signed)
Today, [name] exercised at [facility]. Service time was from 1:30pm to 3:45pm.  The patient exercised for more than 31 minutes performing aerobic, strengthening, and stretching exercises. Oxygen saturation, heart rate, blood pressure, rate of perceived exertion, and shortness of breath were all monitored before, during, and after exercise. [name] presented with no problems at today's exercise session. The patient attended "Nutrition for the Pulmonary Patient" education class with Mickle PlumbEdna Franko.  There was no workload change during today's exercise session.  Pre-exercise vitals:   Weight kg: 75.6   Liters of O2: 2   SpO2: 97   HR: 95   BP: 122/62   CBG: na  Exercise vitals:   Highest heartrate:  107   Lowest oxygen saturation: 96   Highest blood pressure: 122/74   Liters of 02: 3  Post-exercise vitals:   SpO2: 100   HR: 92   BP: 126/64   Liters of O2: 2   CBG: na  Dr. Kalman ShanMurali [name], Medical Director Dr. David StallFeliz-Ortiz is immediately available during today's Pulmonary Rehab session for [name] on 05/24/13 at 1:30pm class time.

## 2013-05-24 NOTE — Progress Notes (Signed)
Today, [place] exercised at [facility]. Service time was from 1330 to 1545.  The patient exercised for more than 31 minutes performing aerobic, strengthening, and stretching exercises. Oxygen saturation, heart rate, blood pressure, rate of perceived exertion, and shortness of breath were all monitored before, during, and after exercise. Librado presented with no problems at today's exercise session. Patient attended the nutrition for pulmonary patients lecture today.  There was no workload change during today's exercise session.  Pre-exercise vitals:   Weight kg: 75.6   Liters of O2: 2   SpO2: 97   HR: 95   BP: 122/62   CBG: NA  Exercise vitals:   Highest heartrate:  107   Lowest oxygen saturation: 96   Highest blood pressure: 124/74   Liters of 02: 3  Post-exercise vitals:   SpO2: 100   HR: 92   BP: 126/64   Liters of O2: 2   CBG: NA Dr. Kalman ShanMurali Ramaswamy, Medical Director Dr. David StallFeliz-Ortiz is immediately available during today's Pulmonary Rehab session for Andris Baumannommy Meares on 05/24/2013 at 1330 class time.

## 2013-05-28 ENCOUNTER — Ambulatory Visit: Payer: 59 | Admitting: Emergency Medicine

## 2013-05-29 ENCOUNTER — Encounter (HOSPITAL_COMMUNITY): Payer: 59

## 2013-05-31 ENCOUNTER — Encounter (HOSPITAL_COMMUNITY): Payer: 59

## 2013-05-31 ENCOUNTER — Ambulatory Visit: Payer: 59 | Admitting: Emergency Medicine

## 2013-06-05 ENCOUNTER — Encounter (HOSPITAL_COMMUNITY): Payer: 59

## 2013-06-07 ENCOUNTER — Encounter (HOSPITAL_COMMUNITY): Admission: RE | Admit: 2013-06-07 | Payer: 59 | Source: Ambulatory Visit

## 2013-06-12 ENCOUNTER — Encounter (HOSPITAL_COMMUNITY): Payer: 59

## 2013-06-14 ENCOUNTER — Encounter (HOSPITAL_COMMUNITY): Payer: 59

## 2013-06-18 ENCOUNTER — Ambulatory Visit: Payer: 59 | Admitting: Emergency Medicine

## 2013-07-02 ENCOUNTER — Telehealth: Payer: Self-pay | Admitting: Critical Care Medicine

## 2013-07-02 MED ORDER — MOXIFLOXACIN HCL 400 MG PO TABS
400.0000 mg | ORAL_TABLET | Freq: Every day | ORAL | Status: DC
Start: 2013-07-02 — End: 2013-07-27

## 2013-07-02 MED ORDER — PREDNISONE 10 MG PO TABS: ORAL_TABLET | ORAL | Status: DC

## 2013-07-02 NOTE — Telephone Encounter (Signed)
Pt with ongoing cough . Called in avelox/prednisone.

## 2013-07-27 ENCOUNTER — Ambulatory Visit (INDEPENDENT_AMBULATORY_CARE_PROVIDER_SITE_OTHER): Payer: 59 | Admitting: Pulmonary Disease

## 2013-07-27 ENCOUNTER — Telehealth: Payer: Self-pay | Admitting: Emergency Medicine

## 2013-07-27 ENCOUNTER — Encounter: Payer: Self-pay | Admitting: Pulmonary Disease

## 2013-07-27 VITALS — BP 124/88 | HR 111 | Temp 98.8°F | Ht 67.5 in | Wt 162.0 lb

## 2013-07-27 DIAGNOSIS — R0902 Hypoxemia: Secondary | ICD-10-CM

## 2013-07-27 DIAGNOSIS — J471 Bronchiectasis with (acute) exacerbation: Secondary | ICD-10-CM

## 2013-07-27 DIAGNOSIS — J9611 Chronic respiratory failure with hypoxia: Secondary | ICD-10-CM

## 2013-07-27 DIAGNOSIS — J9610 Chronic respiratory failure, unspecified whether with hypoxia or hypercapnia: Secondary | ICD-10-CM | POA: Insufficient documentation

## 2013-07-27 DIAGNOSIS — D869 Sarcoidosis, unspecified: Secondary | ICD-10-CM

## 2013-07-27 MED ORDER — CIPROFLOXACIN HCL 750 MG PO TABS: 750.0000 mg | ORAL_TABLET | Freq: Two times a day (BID) | ORAL | Status: DC

## 2013-07-27 MED ORDER — PREDNISONE 10 MG PO TABS: ORAL_TABLET | ORAL | Status: DC

## 2013-07-27 NOTE — Patient Instructions (Signed)
Will treat with cipro 750mg  one am and pm for 10 days Will treat with prednisone for 12 days (40/30/20/10 for 3 days each) Please call if you are not improving by the beginning of the week.

## 2013-07-27 NOTE — Telephone Encounter (Signed)
LMOM x 1 

## 2013-07-27 NOTE — Progress Notes (Signed)
   Subjective:    Patient ID: [name], male    DOB: 08/23/1965, 48 y.o.   MRN: [id number]  HPI The patient comes in today for an acute sick visit. He has known pulmonary sarcoidosis with chronic respiratory failure, as well as underlying bronchiectasis. He has had ongoing issues the last few weeks with increasing chest congestion, cough with purulent mucus, as well as increasing shortness of breath. He was called in a short five-day taper of Avelox and prednisone, but saw absolutely no difference in his symptoms. He is not having any fever or sweats. He had his oxygen turned off today when he first presented with mildly decreased oxygen saturations. On his appropriate flow, his saturations are adequate.   Review of Systems  Constitutional: Negative for fever and unexpected weight change.  HENT: Negative for congestion, dental problem, ear pain, nosebleeds, postnasal drip, rhinorrhea, sinus pressure, sneezing, sore throat and trouble swallowing.   Eyes: Negative for redness and itching.  Respiratory: Positive for cough, chest tightness, shortness of breath and wheezing.   Cardiovascular: Negative for palpitations and leg swelling.  Gastrointestinal: Negative for nausea and vomiting.  Genitourinary: Negative for dysuria.  Musculoskeletal: Negative for joint swelling.  Skin: Negative for rash.  Neurological: Negative for headaches.  Hematological: Does not bruise/bleed easily.  Psychiatric/Behavioral: Negative for dysphoric mood. The patient is not nervous/anxious.        Objective:   Physical Exam Well-developed male in no acute distress Nose without purulence or discharge noted Neck without adenopathy or thyromegaly Chest with decreased breath sounds throughout, a few scattered crackles, no wheezing Cardiac exam with minimally tachycardic rhythm but regular, no murmurs Lower extremities without edema, no cyanosis Alert and oriented, moves all 4 extremities.       Assessment &  Plan:

## 2013-07-27 NOTE — Assessment & Plan Note (Addendum)
The patient is having an acute flare of his underlying bronchiectasis, and did not respond to Avelox. I have reviewed his cultures, and he is positive in the past for Pseudomonas and staph aureus. He has responded well to ciprofloxacin in the past, and will treat him again with this.  Will also treat with a prednisone taper to help with airway inflammation.

## 2013-07-27 NOTE — Telephone Encounter (Signed)
Spoke with the pt's spouse  She reports pt c/o increased SOB and prod cough with yellow sputum  She states that these symptoms started before on 07/02/13 and on call doc called in Avelox  Symptoms are ongoing since then  OV with KC today at 3:15

## 2013-08-22 ENCOUNTER — Encounter (HOSPITAL_COMMUNITY): Payer: Self-pay | Admitting: Emergency Medicine

## 2013-08-22 ENCOUNTER — Emergency Department (HOSPITAL_COMMUNITY)
Admission: EM | Admit: 2013-08-22 | Discharge: 2013-08-22 | Disposition: A | Discharge status: Home / Self Care | Payer: 59 | Attending: Emergency Medicine | Admitting: Emergency Medicine

## 2013-08-22 ENCOUNTER — Emergency Department (HOSPITAL_COMMUNITY): Payer: 59

## 2013-08-22 DIAGNOSIS — Z8669 Personal history of other diseases of the nervous system and sense organs: Secondary | ICD-10-CM | POA: Diagnosis not present

## 2013-08-22 DIAGNOSIS — R0989 Other specified symptoms and signs involving the circulatory and respiratory systems: Secondary | ICD-10-CM | POA: Diagnosis present

## 2013-08-22 DIAGNOSIS — Z79899 Other long term (current) drug therapy: Secondary | ICD-10-CM | POA: Diagnosis not present

## 2013-08-22 DIAGNOSIS — R0609 Other forms of dyspnea: Secondary | ICD-10-CM | POA: Insufficient documentation

## 2013-08-22 DIAGNOSIS — Z792 Long term (current) use of antibiotics: Secondary | ICD-10-CM | POA: Diagnosis not present

## 2013-08-22 DIAGNOSIS — D869 Sarcoidosis, unspecified: Secondary | ICD-10-CM | POA: Insufficient documentation

## 2013-08-22 DIAGNOSIS — I10 Essential (primary) hypertension: Secondary | ICD-10-CM | POA: Insufficient documentation

## 2013-08-22 DIAGNOSIS — IMO0002 Reserved for concepts with insufficient information to code with codable children: Secondary | ICD-10-CM | POA: Diagnosis not present

## 2013-08-22 DIAGNOSIS — J441 Chronic obstructive pulmonary disease with (acute) exacerbation: Secondary | ICD-10-CM | POA: Insufficient documentation

## 2013-08-22 DIAGNOSIS — Z87710 Personal history of (corrected) hypospadias: Secondary | ICD-10-CM | POA: Insufficient documentation

## 2013-08-22 DIAGNOSIS — Z9981 Dependence on supplemental oxygen: Secondary | ICD-10-CM | POA: Diagnosis not present

## 2013-08-22 DIAGNOSIS — G4733 Obstructive sleep apnea (adult) (pediatric): Secondary | ICD-10-CM | POA: Insufficient documentation

## 2013-08-22 DIAGNOSIS — R062 Wheezing: Secondary | ICD-10-CM

## 2013-08-22 LAB — CBC
HEMATOCRIT: 37.4 % — AB (ref 39.0–52.0)
Hemoglobin: 12.3 g/dL — ABNORMAL LOW (ref 13.0–17.0)
MCH: 27.6 pg (ref 26.0–34.0)
MCHC: 32.9 g/dL (ref 30.0–36.0)
MCV: 83.9 fL (ref 78.0–100.0)
PLATELETS: 250 10*3/uL (ref 150–400)
RBC: 4.46 MIL/uL (ref 4.22–5.81)
RDW: 13.5 % (ref 11.5–15.5)
WBC: 4.7 10*3/uL (ref 4.0–10.5)

## 2013-08-22 LAB — BASIC METABOLIC PANEL
ANION GAP: 11 (ref 5–15)
BUN: 10 mg/dL (ref 6–23)
CHLORIDE: 100 meq/L (ref 96–112)
CO2: 29 meq/L (ref 19–32)
Calcium: 10.1 mg/dL (ref 8.4–10.5)
Creatinine, Ser: 1.04 mg/dL (ref 0.50–1.35)
GFR calc Af Amer: >90 mL/min (ref >90)
GFR calc non Af Amer: 83 mL/min — ABNORMAL LOW (ref >90)
Glucose, Bld: 106 mg/dL — ABNORMAL HIGH (ref 70–99)
POTASSIUM: 5 meq/L (ref 3.7–5.3)
Sodium: 140 mEq/L (ref 137–147)

## 2013-08-22 LAB — PRO B NATRIURETIC PEPTIDE: Pro B Natriuretic peptide (BNP): 59.2 pg/mL (ref 0–125)

## 2013-08-22 MED ORDER — ALBUTEROL SULFATE (2.5 MG/3ML) 0.083% IN NEBU
2.5000 mg | INHALATION_SOLUTION | Freq: Once | RESPIRATORY_TRACT | Status: AC
  Administered 2013-08-22: 2.5 mg via RESPIRATORY_TRACT
  Filled 2013-08-22: qty 3

## 2013-08-22 MED ORDER — METHYLPREDNISOLONE SODIUM SUCC 125 MG IJ SOLR
125.0000 mg | Freq: Once | INTRAMUSCULAR | Status: AC
  Administered 2013-08-22: 125 mg via INTRAVENOUS
  Filled 2013-08-22: qty 2

## 2013-08-22 MED ORDER — LEVOFLOXACIN IN D5W 500 MG/100ML IV SOLN
500.0000 mg | Freq: Once | INTRAVENOUS | Status: AC
  Administered 2013-08-22: 500 mg via INTRAVENOUS
  Filled 2013-08-22: qty 100

## 2013-08-22 MED ORDER — IPRATROPIUM-ALBUTEROL 0.5-2.5 (3) MG/3ML IN SOLN
3.0000 mL | Freq: Once | RESPIRATORY_TRACT | Status: AC
  Administered 2013-08-22: 3 mL via RESPIRATORY_TRACT
  Filled 2013-08-22: qty 3

## 2013-08-22 MED ORDER — ALBUTEROL SULFATE (2.5 MG/3ML) 0.083% IN NEBU
2.5000 mg | INHALATION_SOLUTION | Freq: Once | RESPIRATORY_TRACT | Status: AC
Start: 2013-08-22 — End: 2013-08-22
  Administered 2013-08-22: 2.5 mg via RESPIRATORY_TRACT
  Filled 2013-08-22: qty 3

## 2013-08-22 MED ORDER — PREDNISONE 50 MG PO TABS: 50.0000 mg | ORAL_TABLET | Freq: Every day | ORAL | Status: DC

## 2013-08-22 MED ORDER — MOXIFLOXACIN HCL 400 MG PO TABS: 400.0000 mg | ORAL_TABLET | Freq: Every day | ORAL | Status: DC

## 2013-08-22 NOTE — Discharge Instructions (Signed)
Return to the ED with any concerns including difficulty breathing despite using albuterol every 4 hours, not drinking fluids, decreased urine output, vomiting and not able to keep down liquids or medications, decreased level of alertness/lethargy, or any other alarming symptoms °

## 2013-08-22 NOTE — ED Provider Notes (Signed)
CSN: [id number]     Arrival date & time 08/22/13  1602 History   First MD Initiated Contact with Patient 08/22/13 1649     Chief Complaint  Patient presents with  . Respiratory Distress     (Consider location/radiation/quality/duration/timing/severity/associated sxs/prior Treatment) HPI Pt with hx of sarcoidosis and COPD presenting with shortness of breath and wheezing.  Pt states he has been using albuterol treatments which are no longer helping at home.  He recently finished a course of abx as well as prednisone.  Pt states symptoms have been ongoing for several days, but worsening.  Denies fever/chills.  Denies change in sputum.  No chest pain, no leg swelling.  Feels like prior flares of sarcoid/COPD There are no other associated systemic symptoms, there are no other alleviating or modifying factors.  Pt uses 2-3 Liters [place] o2 at home 24 hours a day  Past Medical History  Diagnosis Date  . Hypertension   . Sarcoidosis   . OSA on CPAP   . Chronic respiratory failure     home oxygen PRN  . Bronchitis   . Pneumonia   . COPD (chronic obstructive pulmonary disease)   . Neuromuscular disorder    Past Surgical History  Procedure Laterality Date  . Rotator cuff repair    . Arm surgery     . Video assisted thoracoscopy (vats)/thorocotomy Left 07/17/2012    Procedure: VIDEO ASSISTED THORACOSCOPY (VATS)/BLEB Stapling;  Surgeon: Alleen Borne, MD;  Location: [facility] OR;  Service: Thoracic;  Laterality: Left;   Family History  Problem Relation Age of Onset  . Diabetes Father    History  Substance Use Topics  . Smoking status: Never Smoker   . Smokeless tobacco: Never Used  . Alcohol Use: Yes     Comment: social    Review of Systems ROS reviewed and all otherwise negative except for mentioned in HPI    Allergies  Clindamycin/lincomycin  Home Medications   Prior to Admission medications   Medication Sig Start Date End Date Taking? Authorizing Provider  albuterol (PROAIR HFA) 108  (90 BASE) MCG/ACT inhaler Inhale 2 puffs into the lungs every 6 (six) hours as needed for wheezing or shortness of breath. 03/28/13  Yes Leslye Peer, MD  albuterol (PROVENTIL) (2.5 MG/3ML) 0.083% nebulizer solution Take 2.5 mg by nebulization every 4 (four) hours as needed for wheezing or shortness of breath.  06/08/12  Yes Historical Provider, MD  budesonide-formoterol (SYMBICORT) 160-4.5 MCG/ACT inhaler Inhale 2 puffs into the lungs 2 (two) times daily. 03/28/13  Yes Leslye Peer, MD  calcium-vitamin D (OSCAL WITH D) 500-200 MG-UNIT per tablet Take 1 tablet by mouth every morning.   Yes Historical Provider, MD  cholecalciferol (VITAMIN D) 1000 UNITS tablet Take 1,000 Units by mouth every morning.    Yes Historical Provider, MD  cyclobenzaprine (FLEXERIL) 10 MG tablet Take 10 mg by mouth 3 (three) times daily as needed for muscle spasms.    Yes Historical Provider, MD  diphenhydrAMINE (BENADRYL) 25 MG tablet Take 25 mg by mouth every 6 (six) hours as needed for allergies.    Yes Historical Provider, MD  docusate sodium (COLACE) 100 MG capsule Take 100 mg by mouth 2 (two) times daily as needed (For constipation.).   Yes Historical Provider, MD  famotidine (PEPCID) 20 MG tablet Take 1 tablet (20 mg total) by mouth 2 (two) times daily. 03/08/12  Yes Renae Fickle, MD  ferrous sulfate 325 (65 FE) MG tablet Take 1 tablet (325 mg  total) by mouth 2 (two) times daily with a meal. 06/18/12  Yes Leroy SeaPrashant K Singh, MD  fluticasone (FLONASE) 50 MCG/ACT nasal spray Place 2 sprays into the nose daily. 03/08/12  Yes Renae FickleMackenzie Short, MD  guaiFENesin (MUCINEX) 600 MG 12 hr tablet Take 1,200 mg by mouth 2 (two) times daily as needed (For congestion.).    Yes Historical Provider, MD  lidocaine (LIDODERM) 5 % Place 1 patch onto the skin daily as needed (For pain.).    Yes Historical Provider, MD  metoprolol (LOPRESSOR) 50 MG tablet Take 1 tablet (50 mg total) by mouth 2 (two) times daily. 01/02/13  Yes Leslye Peerobert S Byrum, MD   Multiple Vitamins-Minerals (MULTIVITAMIN WITH MINERALS) tablet Take 1 tablet by mouth every morning.    Yes Historical Provider, MD  pregabalin (LYRICA) 200 MG capsule Take 200 mg by mouth 2 (two) times daily as needed (For pain.).    Yes Historical Provider, MD  promethazine (PHENERGAN) 25 MG tablet Take 1 tablet (25 mg total) by mouth every 6 (six) hours as needed for Nausea. 11/02/11  Yes Historical Provider, MD  sodium chloride (OCEAN) 0.65 % SOLN nasal spray Place 2 sprays into the nose 4 (four) times daily. 03/08/12  Yes Renae FickleMackenzie Short, MD  moxifloxacin (AVELOX) 400 MG tablet Take 1 tablet (400 mg total) by mouth daily at 8 pm. 08/22/13   Ethelda ChickMartha K Linker, MD  predniSONE (DELTASONE) 50 MG tablet Take 1 tablet (50 mg total) by mouth daily. 08/22/13   Ethelda ChickMartha K Linker, MD   BP 155/99  Pulse 126  Temp(Src) 98.1 F (36.7 C) (Oral)  Resp 25  SpO2 97% Vitals reviewed Physical Exam Physical Examination: General appearance - alert, well appearing, and in no distress Mental status - alert, oriented to person, place, and time Eyes - no conjunctival injection, no scleral icterus Mouth - mucous membranes moist, pharynx normal without lesions Chest - decreased air movement, bialteral wheezing on expiration, mild increased respiratory effort, symmetric air entry Heart - normal rate, regular rhythm, normal S1, S2, no murmurs, rubs, clicks or gallops Abdomen - soft, nontender, nondistended, no masses or organomegaly Extremities - peripheral pulses normal, no pedal edema, no clubbing or cyanosis Skin - normal coloration and turgor, no rashes  ED Course  Procedures (including critical care time)  6:53 PM pt feeling better after duonebs  X2, RR is improved.    10:23 PM pt ambulated and stayed at 96% on his home 2L o2 Labs Review Labs Reviewed  CBC - Abnormal; Notable for the following:    Hemoglobin 12.3 (*)    HCT 37.4 (*)    All other components within normal limits  BASIC METABOLIC PANEL -  Abnormal; Notable for the following:    Glucose, Bld 106 (*)    GFR calc non Af Amer 83 (*)    All other components within normal limits  PRO B NATRIURETIC PEPTIDE    Imaging Review No results found.   EKG Interpretation   Date/Time:  Wednesday August 22 2013 16:15:27 EDT Ventricular Rate:  113 PR Interval:  176 QRS Duration: 83 QT Interval:  347 QTC Calculation: 476 R Axis:   93 Text Interpretation:  Sinus tachycardia Biatrial enlargement Borderline  right axis deviation Probable anteroseptal infarct, old Baseline wander in  lead(s) II III aVL aVF Confirmed by DOCHERTY  MD, MEGAN ([id number]) on  08/22/2013 4:19:46 PM      MDM   Final diagnoses:  Sarcoidosis  Wheezing    Pt presenting with wheezing  and difficulty breathing. He states he recently finished course of abx and steroids for the same symptoms- was feeling better until several days after finishing those meds.  Pt treated with mulitple albuterol/atrovent nebs- he felt significantly improved.  I talked with him and offered admission but he would much prefer to go home if possible.  He ambulated and maintained O2 sats.  Treated with abx and steroids.  His work of breathing decreased back to baseline prior to discharge. Discharged with strict return precautions.  Pt agreeable with plan.    Ethelda Chick, MD 08/25/13 [phone]

## 2013-08-22 NOTE — ED Notes (Signed)
Pt's O2 sat 95% on 2L [place] while ambulating

## 2013-08-22 NOTE — ED Notes (Signed)
He states he has had increasing shortness of breath for past few days which his home breathing treatments are no longer helping.  He further tells me he finished a prednisone dosepack about a week and a half ago.  He is short of breath; very nearly dyspneic, with audible wheezes.  He states "The last time I felt like this was about a year ago when I had a punctured lung and they put a tube in".

## 2013-08-22 NOTE — ED Notes (Signed)
Pt reports severe difficulty breathing x 2 days.  Has hx of sarcoidosis and is on 2L O2 [place].  Pt's breathing is labored and is tripoding.

## 2013-08-27 IMAGING — CR DG CHEST 1V PORT
1 series · 1 of 1 positions shown · non-contrast
Comparison: 03/12/2012

CLINICAL DATA: Shortness of breath and chest pain.  History of
sarcoidosis.

PORTABLE CHEST - 1 VIEW

[AP]
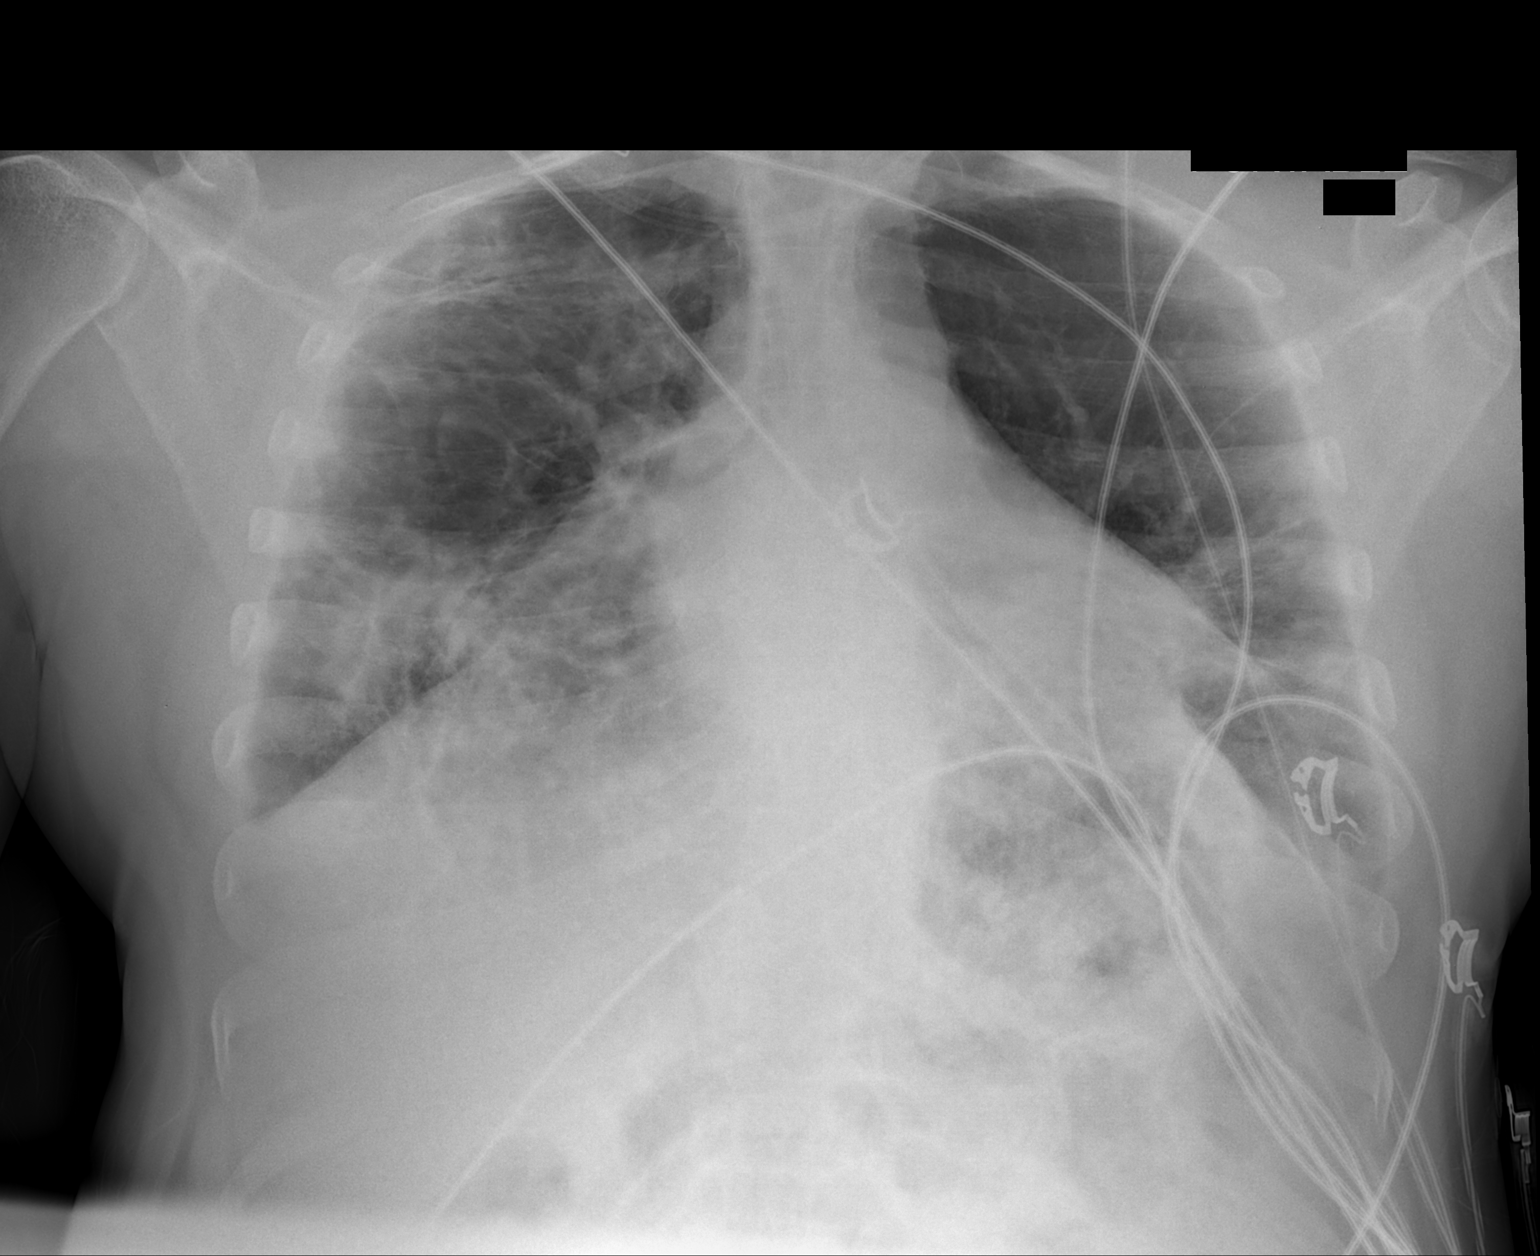

[1 of 1 positions shown; findings below may reference images not displayed]

FINDINGS: 8111 hours.  The patient is in a lordotic position.
Bilateral pleural parenchymal scarring is again noted the and
accentuated at the right base secondary to positioning and lower
lung volumes. Cardiopericardial silhouette is at upper limits of
normal for size. Telemetry leads overlie the chest.
IMPRESSION: Lordotic low volume film with persistent pleural parenchymal
changes consistent with a reported history of sarcoidosis.  No new
or progressive findings.

## 2013-09-05 ENCOUNTER — Emergency Department (HOSPITAL_COMMUNITY)
Admission: EM | Admit: 2013-09-05 | Discharge: 2013-09-05 | Disposition: A | Discharge status: Home / Self Care | Payer: 59 | Attending: Emergency Medicine | Admitting: Emergency Medicine

## 2013-09-05 ENCOUNTER — Encounter (HOSPITAL_COMMUNITY): Payer: Self-pay | Admitting: Emergency Medicine

## 2013-09-05 DIAGNOSIS — Z79899 Other long term (current) drug therapy: Secondary | ICD-10-CM | POA: Insufficient documentation

## 2013-09-05 DIAGNOSIS — Z8701 Personal history of pneumonia (recurrent): Secondary | ICD-10-CM | POA: Diagnosis not present

## 2013-09-05 DIAGNOSIS — M25539 Pain in unspecified wrist: Secondary | ICD-10-CM | POA: Diagnosis present

## 2013-09-05 DIAGNOSIS — G4733 Obstructive sleep apnea (adult) (pediatric): Secondary | ICD-10-CM | POA: Insufficient documentation

## 2013-09-05 DIAGNOSIS — J449 Chronic obstructive pulmonary disease, unspecified: Secondary | ICD-10-CM | POA: Diagnosis not present

## 2013-09-05 DIAGNOSIS — IMO0002 Reserved for concepts with insufficient information to code with codable children: Secondary | ICD-10-CM | POA: Insufficient documentation

## 2013-09-05 DIAGNOSIS — M25531 Pain in right wrist: Secondary | ICD-10-CM

## 2013-09-05 DIAGNOSIS — J4489 Other specified chronic obstructive pulmonary disease: Secondary | ICD-10-CM | POA: Insufficient documentation

## 2013-09-05 DIAGNOSIS — Z8619 Personal history of other infectious and parasitic diseases: Secondary | ICD-10-CM | POA: Diagnosis not present

## 2013-09-05 DIAGNOSIS — I10 Essential (primary) hypertension: Secondary | ICD-10-CM | POA: Diagnosis not present

## 2013-09-05 MED ORDER — TRAMADOL HCL 50 MG PO TABS: 50.0000 mg | ORAL_TABLET | Freq: Four times a day (QID) | ORAL | Status: DC | PRN

## 2013-09-05 NOTE — ED Notes (Signed)
Pt c/o rt hand/wrist pain.  States he was dx w/ tendonitis last week but his pain has gotten worse.

## 2013-09-05 NOTE — ED Provider Notes (Signed)
CSN: [id number]     Arrival date & time 09/05/13  1210 History  This chart was scribed for non-physician practitioner, Santiago Glad, PA-C,working with Ward Givens, MD, by Karle Plumber, ED Scribe. This patient was seen in room WTR7/WTR7 and the patient's care was started at 1:45 PM.  Chief Complaint  Patient presents with  . Hand Pain   Patient is a 48 y.o. male presenting with hand pain. The history is provided by the patient. No language interpreter was used.  Hand Pain  Hand Pain Associated symptoms include arthralgias. Pertinent negatives include no numbness.   HPI Comments:  [name] is a 48 y.o. male who presents to the Emergency Department complaining of right hand and wrist pain that started approximately two weeks ago. No injury or trauma.  He states the pain starts on the radial aspect of the hand and wrist and radiates to the dorsum of the hand. Pt reports wearing an ASO wrist splint which he has in place now and taking his regularly prescribed pain medication, Flexeril and Lyrica, with no significant relief of the pain. He reports icing the area but states that makes the pain worse. He describes the pain as burning. He reports any movement or anything touching the hand and wrist makes the pain worse. He denies any alleviating factors. He denies fever, chills, numbness or tingling.   Past Medical History  Diagnosis Date  . Hypertension   . Sarcoidosis   . OSA on CPAP   . Chronic respiratory failure     home oxygen PRN  . Bronchitis   . Pneumonia   . COPD (chronic obstructive pulmonary disease)   . Neuromuscular disorder    Past Surgical History  Procedure Laterality Date  . Rotator cuff repair    . Arm surgery     . Video assisted thoracoscopy (vats)/thorocotomy Left 07/17/2012    Procedure: VIDEO ASSISTED THORACOSCOPY (VATS)/BLEB Stapling;  Surgeon: Alleen Borne, MD;  Location: [facility] OR;  Service: Thoracic;  Laterality: Left;   Family History  Problem Relation  Age of Onset  . Diabetes Father    History  Substance Use Topics  . Smoking status: Never Smoker   . Smokeless tobacco: Never Used  . Alcohol Use: Yes     Comment: social    Review of Systems  Musculoskeletal: Positive for arthralgias.  Skin: Negative for color change and wound.  Neurological: Negative for numbness.    Allergies  Clindamycin/lincomycin  Home Medications   Prior to Admission medications   Medication Sig Start Date End Date Taking? Authorizing Provider  albuterol (PROAIR HFA) 108 (90 BASE) MCG/ACT inhaler Inhale 2 puffs into the lungs every 6 (six) hours as needed for wheezing or shortness of breath. 03/28/13   Leslye Peer, MD  albuterol (PROVENTIL) (2.5 MG/3ML) 0.083% nebulizer solution Take 2.5 mg by nebulization every 4 (four) hours as needed for wheezing or shortness of breath.  06/08/12   Historical Provider, MD  budesonide-formoterol (SYMBICORT) 160-4.5 MCG/ACT inhaler Inhale 2 puffs into the lungs 2 (two) times daily. 03/28/13   Leslye Peer, MD  calcium-vitamin D (OSCAL WITH D) 500-200 MG-UNIT per tablet Take 1 tablet by mouth every morning.    Historical Provider, MD  cholecalciferol (VITAMIN D) 1000 UNITS tablet Take 1,000 Units by mouth every morning.     Historical Provider, MD  cyclobenzaprine (FLEXERIL) 10 MG tablet Take 10 mg by mouth 3 (three) times daily as needed for muscle spasms.     Historical  Provider, MD  diphenhydrAMINE (BENADRYL) 25 MG tablet Take 25 mg by mouth every 6 (six) hours as needed for allergies.     Historical Provider, MD  docusate sodium (COLACE) 100 MG capsule Take 100 mg by mouth 2 (two) times daily as needed (For constipation.).    Historical Provider, MD  famotidine (PEPCID) 20 MG tablet Take 1 tablet (20 mg total) by mouth 2 (two) times daily. 03/08/12   Renae Fickle, MD  ferrous sulfate 325 (65 FE) MG tablet Take 1 tablet (325 mg total) by mouth 2 (two) times daily with a meal. 06/18/12   Leroy Sea, MD  fluticasone  (FLONASE) 50 MCG/ACT nasal spray Place 2 sprays into the nose daily. 03/08/12   Renae Fickle, MD  guaiFENesin (MUCINEX) 600 MG 12 hr tablet Take 1,200 mg by mouth 2 (two) times daily as needed (For congestion.).     Historical Provider, MD  lidocaine (LIDODERM) 5 % Place 1 patch onto the skin daily as needed (For pain.).     Historical Provider, MD  metoprolol (LOPRESSOR) 50 MG tablet Take 1 tablet (50 mg total) by mouth 2 (two) times daily. 01/02/13   Leslye Peer, MD  moxifloxacin (AVELOX) 400 MG tablet Take 1 tablet (400 mg total) by mouth daily at 8 pm. 08/22/13   Ethelda Chick, MD  Multiple Vitamins-Minerals (MULTIVITAMIN WITH MINERALS) tablet Take 1 tablet by mouth every morning.     Historical Provider, MD  predniSONE (DELTASONE) 50 MG tablet Take 1 tablet (50 mg total) by mouth daily. 08/22/13   Ethelda Chick, MD  pregabalin (LYRICA) 200 MG capsule Take 200 mg by mouth 2 (two) times daily as needed (For pain.).     Historical Provider, MD  promethazine (PHENERGAN) 25 MG tablet Take 1 tablet (25 mg total) by mouth every 6 (six) hours as needed for Nausea. 11/02/11   Historical Provider, MD  sodium chloride (OCEAN) 0.65 % SOLN nasal spray Place 2 sprays into the nose 4 (four) times daily. 03/08/12   Renae Fickle, MD  traMADol (ULTRAM) 50 MG tablet Take 1 tablet (50 mg total) by mouth every 6 (six) hours as needed. 09/05/13   Santiago Glad, PA-C   Triage Vitals: BP 146/114  Pulse 100  Temp(Src) 98.6 F (37 C) (Oral)  Resp 18  SpO2 100% Physical Exam  Nursing note and vitals reviewed. Constitutional: He is oriented to person, place, and time. He appears well-developed and well-nourished.  HENT:  Head: Normocephalic and atraumatic.  Eyes: EOM are normal.  Neck: Normal range of motion.  Cardiovascular: Normal rate.   Radial pulse is 2+ of right arm. Good capillary refill of all fingers of right hand.  Pulmonary/Chest: Effort normal.  Musculoskeletal: Normal range of motion. He  exhibits tenderness. He exhibits no edema.  Pain is worse in radial aspect of right wrist. Pain with flexion and extension of the wrist, but is still able to move the wrist. No erythema, warmth or edema to the wrist. Positive Fincklestein's of the right hand. Full ROM of elbow and fingers of right arm and hand. Negative Tinel's sign.  Neurological: He is alert and oriented to person, place, and time.  Distal sensation of all fingers of the right hand intact.   Skin: Skin is warm and dry.  Psychiatric: He has a normal mood and affect. His behavior is normal.    ED Course  Procedures (including critical care time) DIAGNOSTIC STUDIES: Oxygen Saturation is 100% on 2 L/min, normal by  my interpretation.   COORDINATION OF CARE: 1:53 PM- Will prescribe pain medication and refer to a hand specialist. Pt verbalizes understanding and agrees to plan.  Medications - No data to display  Labs Review Labs Reviewed - No data to display  Imaging Review No results found.   EKG Interpretation None      MDM   Final diagnoses:  Right wrist pain   Patient presenting with wrist pain.  No injury or trauma.  No signs of infection.  Neurovascularly intact.  Suspect tendonitis.  Patient stable for discharge.    I personally performed the services described in this documentation, which was scribed in my presence. The recorded information has been reviewed and is accurate.    Santiago Glad, PA-C 09/12/13 2226

## 2013-09-07 IMAGING — CT CT CHEST W/O CM
1 of 4 series · 15 of 32 positions shown, 19 images · non-contrast
Comparison: CTA chest dated 02/14/2012

CLINICAL DATA: Sarcoidosis, congestion, cough

CT CHEST WITHOUT CONTRAST
TECHNIQUE: Multidetector CT imaging of the chest was performed
following the standard protocol without IV contrast.

[Series 2: hi-res 5mm low dose · axial · 0.76mm/px · z∈[-279,-9]mm · 15 of 62 slices shown, 19 images]
[im 4/62  mediastinal]
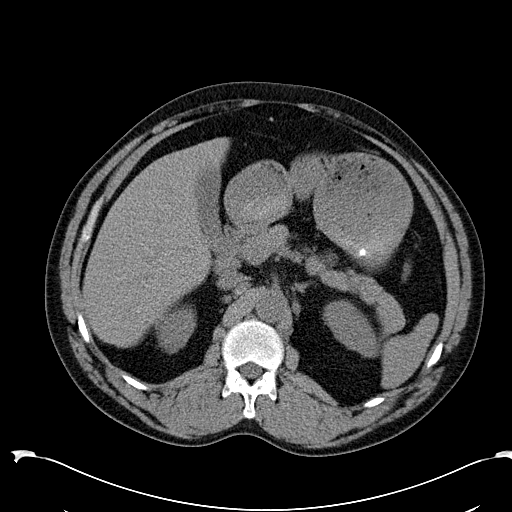
[im 4/62  lung]
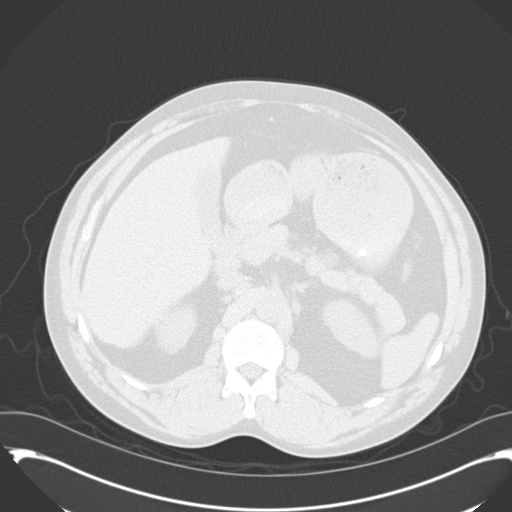
[im 8/62  lung]
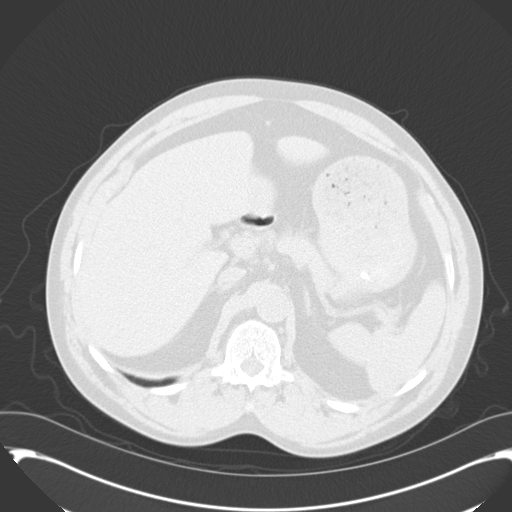
[im 12/62  lung]
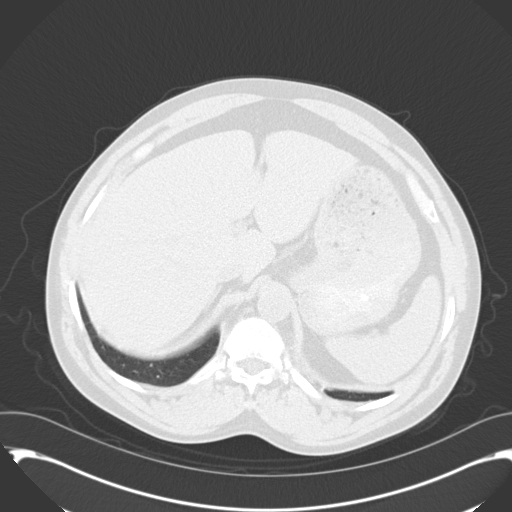
[im 16/62  lung]
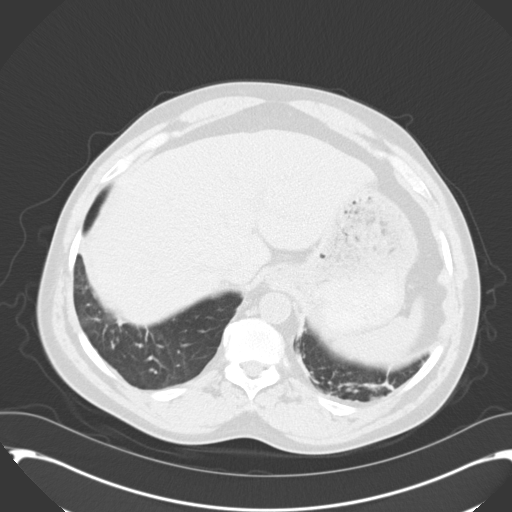
[im 20/62  mediastinal]
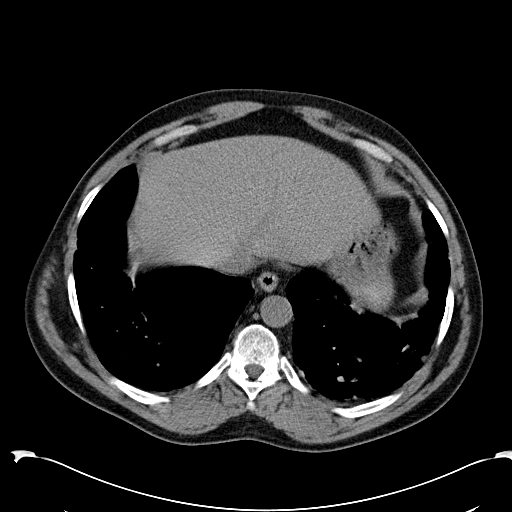
[im 20/62  lung]
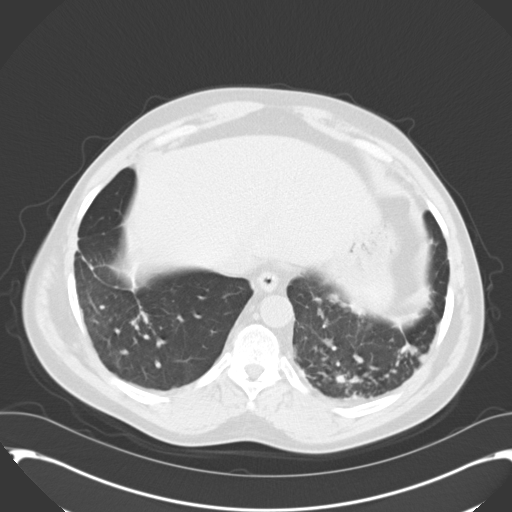
[im 23/62  lung]
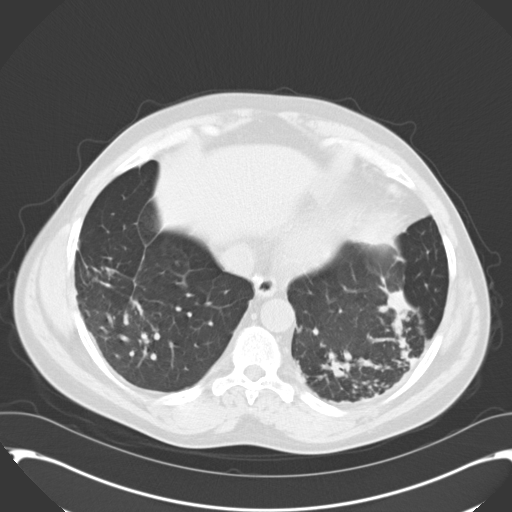
[im 27/62  lung]
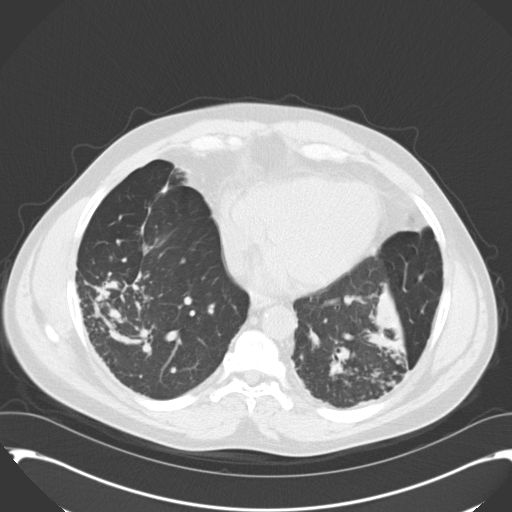
[im 31/62  lung]
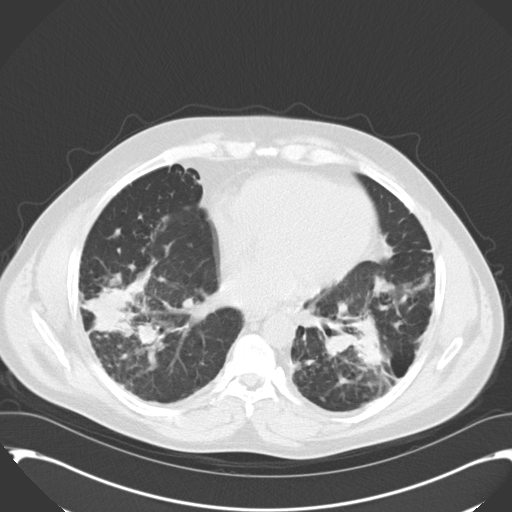
[im 35/62  mediastinal]
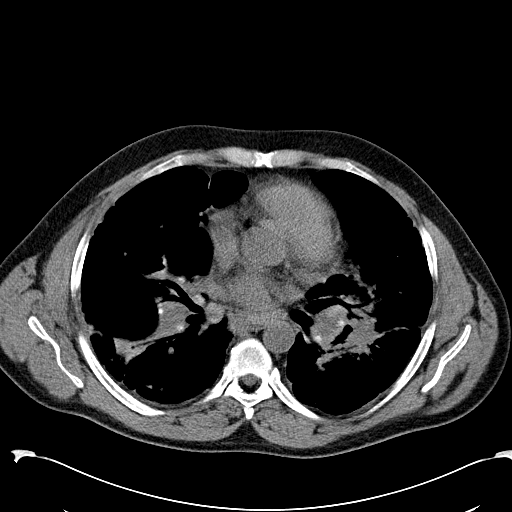
[im 35/62  lung]
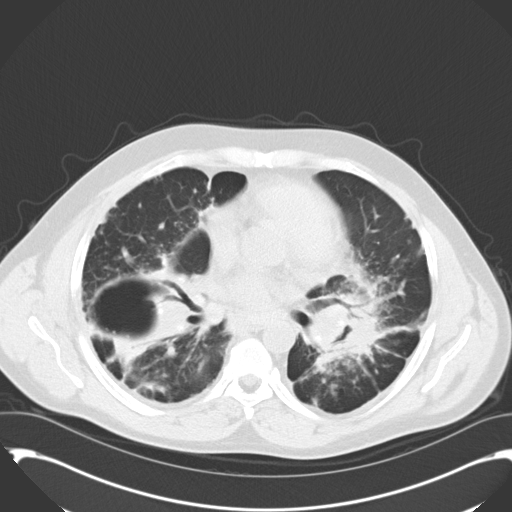
[im 39/62  lung]
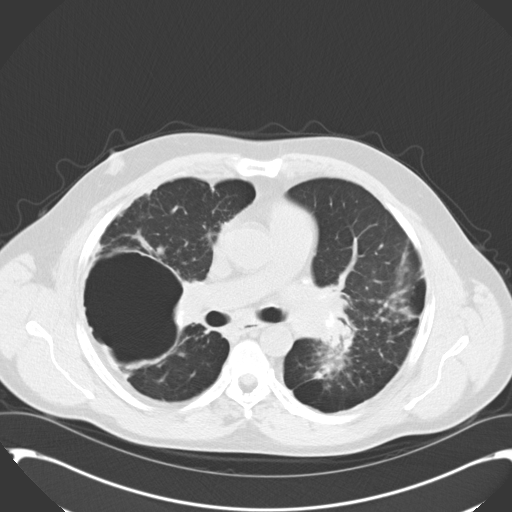
[im 42/62  lung]
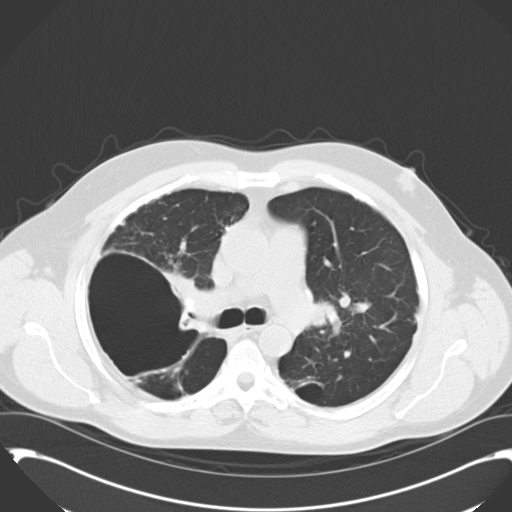
[im 46/62  lung]
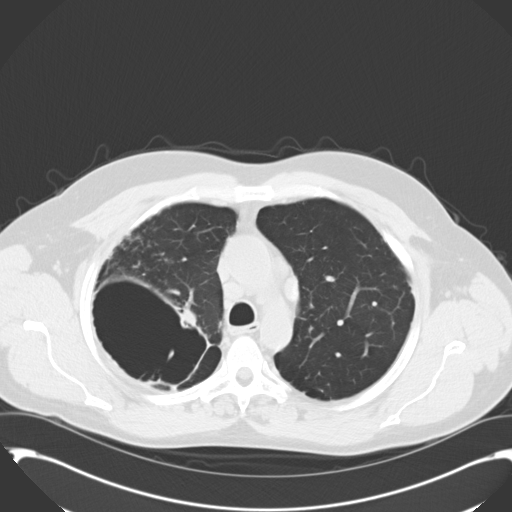
[im 50/62  mediastinal]
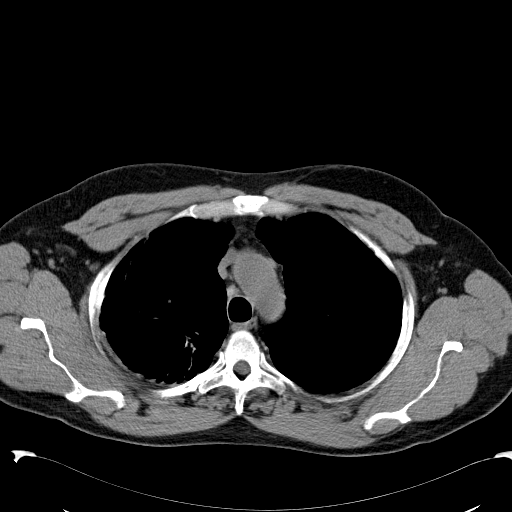
[im 50/62  lung]
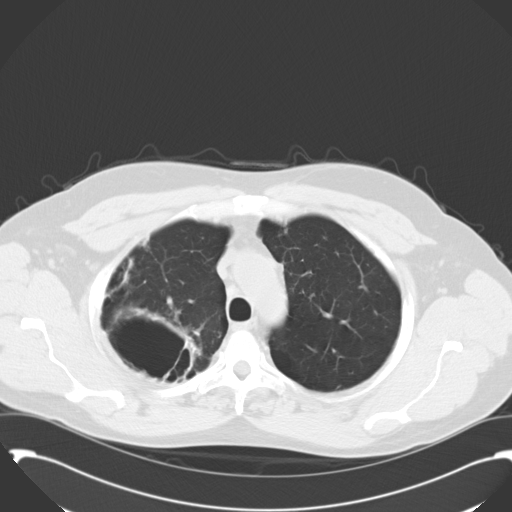
[im 54/62  lung]
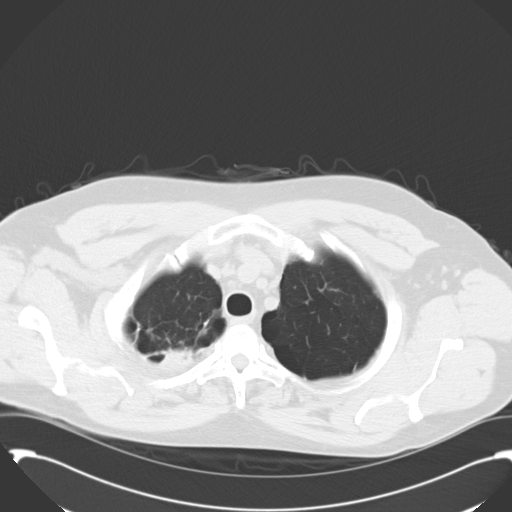
[im 58/62  lung]
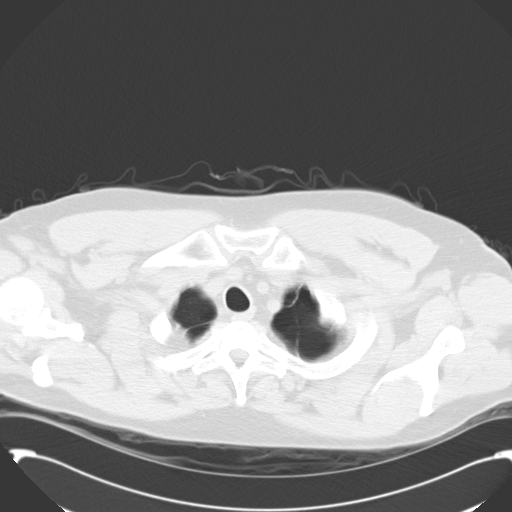

[15 of 32 positions shown; findings below may reference images not displayed]

FINDINGS: 10.1 x 7.8 cm thin-walled cavitary/bullous lesion in the
posterior right upper lobe (series 4/image 20), unchanged.

Confluent perihilar opacities with traction bronchiectasis and
architectural distortion, related to known sarcoidosis.  Associated
peribronchovascular nodularity bilaterally.

On inspiratory / expiratory imaging, there is no evidence of air
trapping.

No superimposed opacities suspicious for pneumonia.  No pleural
effusion or pneumothorax.

Heart is normal in size.  No pericardial effusion.

Partially calcified mediastinal lymphadenopathy, including:
--1.3 cm short-axis right paratracheal node (series 2/image 17)
--1.0 cm short-axis AP window node (series 2/image 18)
--1.9 cm short-axis subcarinal node (series 2/image 27)

Associated hilar lymphadenopathy is suspected but difficult to
discretely measure in the absence of intravenous contrast
administration.  The overall appearance is unchanged.

Visualized upper abdomen is unremarkable.

Visualized osseous structures are within normal limits.
IMPRESSION: Stable pulmonary and mediastinal findings of sarcoidosis, as
described above.

## 2013-09-14 NOTE — ED Provider Notes (Signed)
Medical screening examination/treatment/procedure(s) were performed by non-physician practitioner and as supervising physician I was immediately available for consultation/collaboration.   EKG Interpretation None        Ward Givens, MD 09/14/13 1458

## 2013-09-20 IMAGING — CR DG CHEST 1V PORT
1 series · 1 of 1 positions shown · non-contrast
Comparison: 4891 hours the same day.

CLINICAL DATA: 46-year-old male chest tube placement.

PORTABLE CHEST - 1 VIEW

[AP]
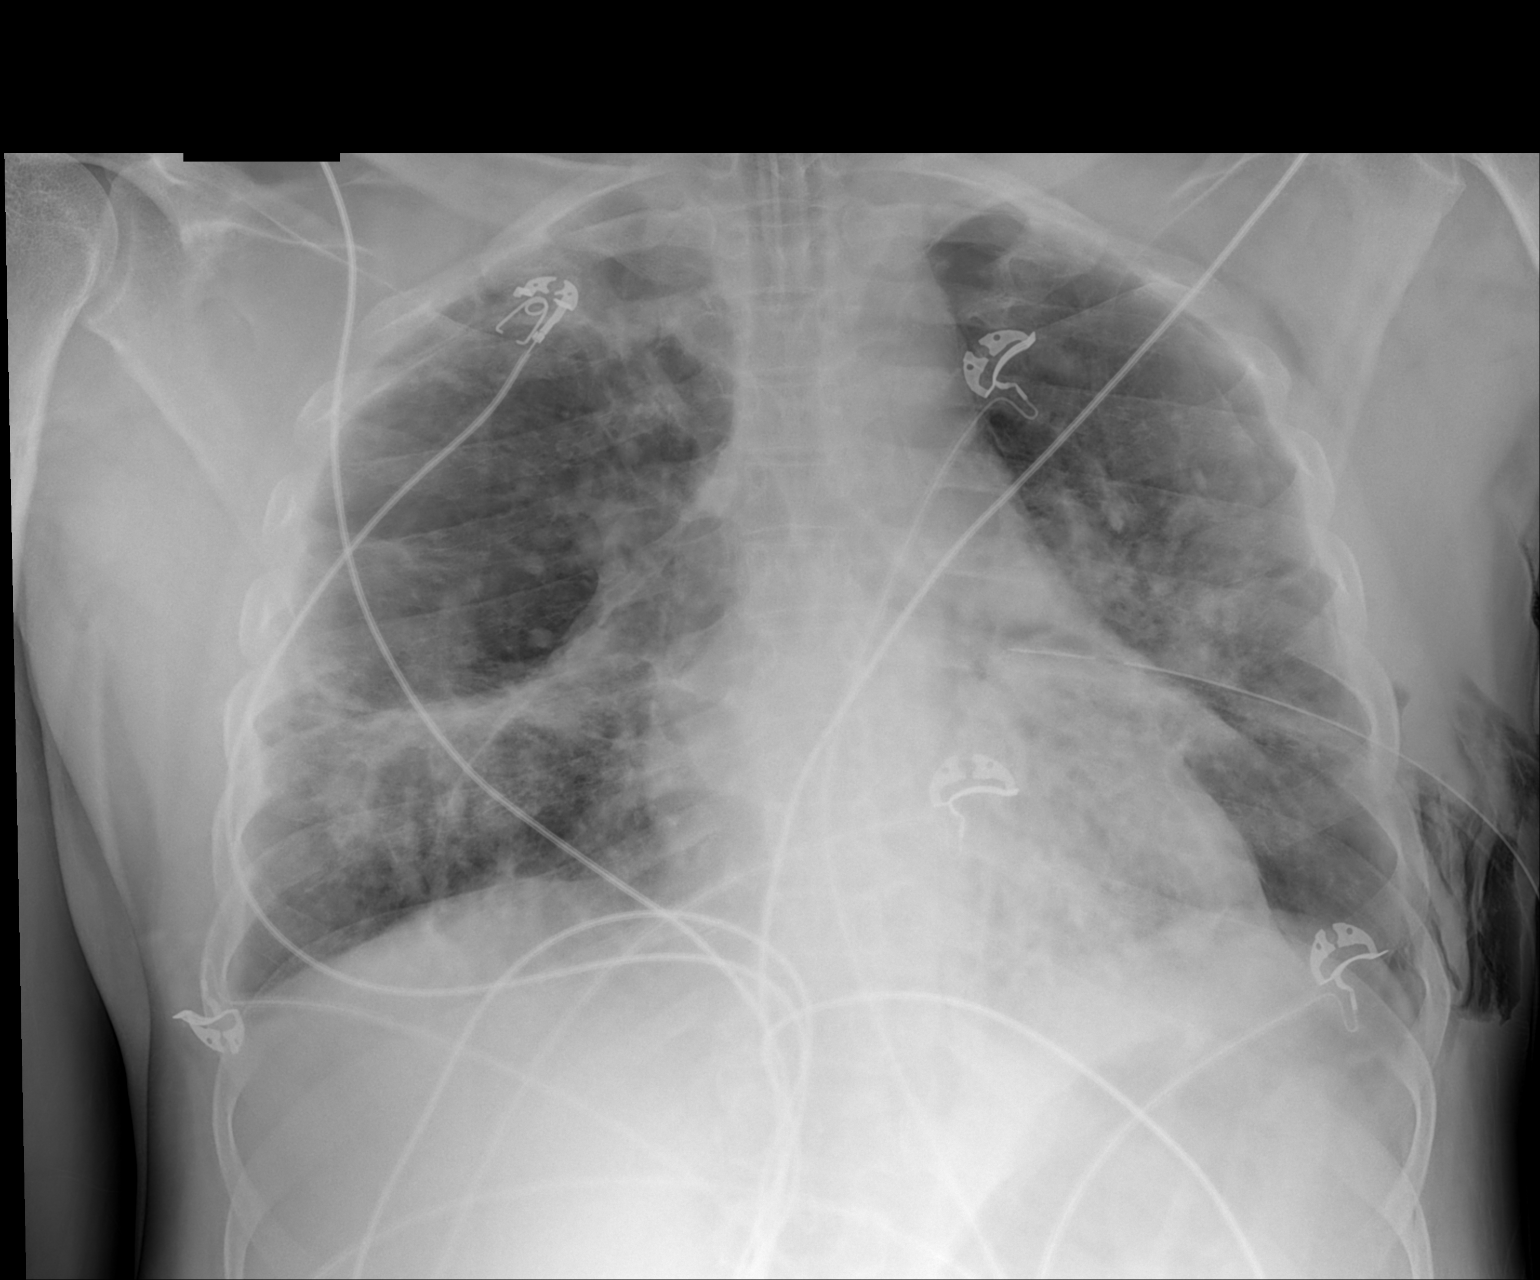

[1 of 1 positions shown; findings below may reference images not displayed]

FINDINGS: AP portable semi upright view at 4866 hours.  Left chest
tube now in place.  Small to moderate volume of left chest wall
subcutaneous gas.  Significantly decreased left pneumothorax.
Small to moderate volume residual.  Interval expansion of the left
lung.  Bilateral perihilar nodular pulmonary opacity.  Endotracheal
tube tip stable just below the clavicles.  No mediastinal shift.
No definite pleural effusion.
IMPRESSION: 1.  Left chest tube placed with significant reduction of left
pneumothorax.  Small to moderate volume residual.
2.  Stable endotracheal tube.
3.  Bilateral perihilar nodular pulmonary opacity.

## 2013-09-20 IMAGING — CR DG CHEST 1V PORT
1 series · 1 of 1 positions shown · non-contrast
Comparison: CT chest 06/27/2012

CLINICAL DATA: Shortness of breath.  Respiratory distress.

PORTABLE CHEST - 1 VIEW

[AP]
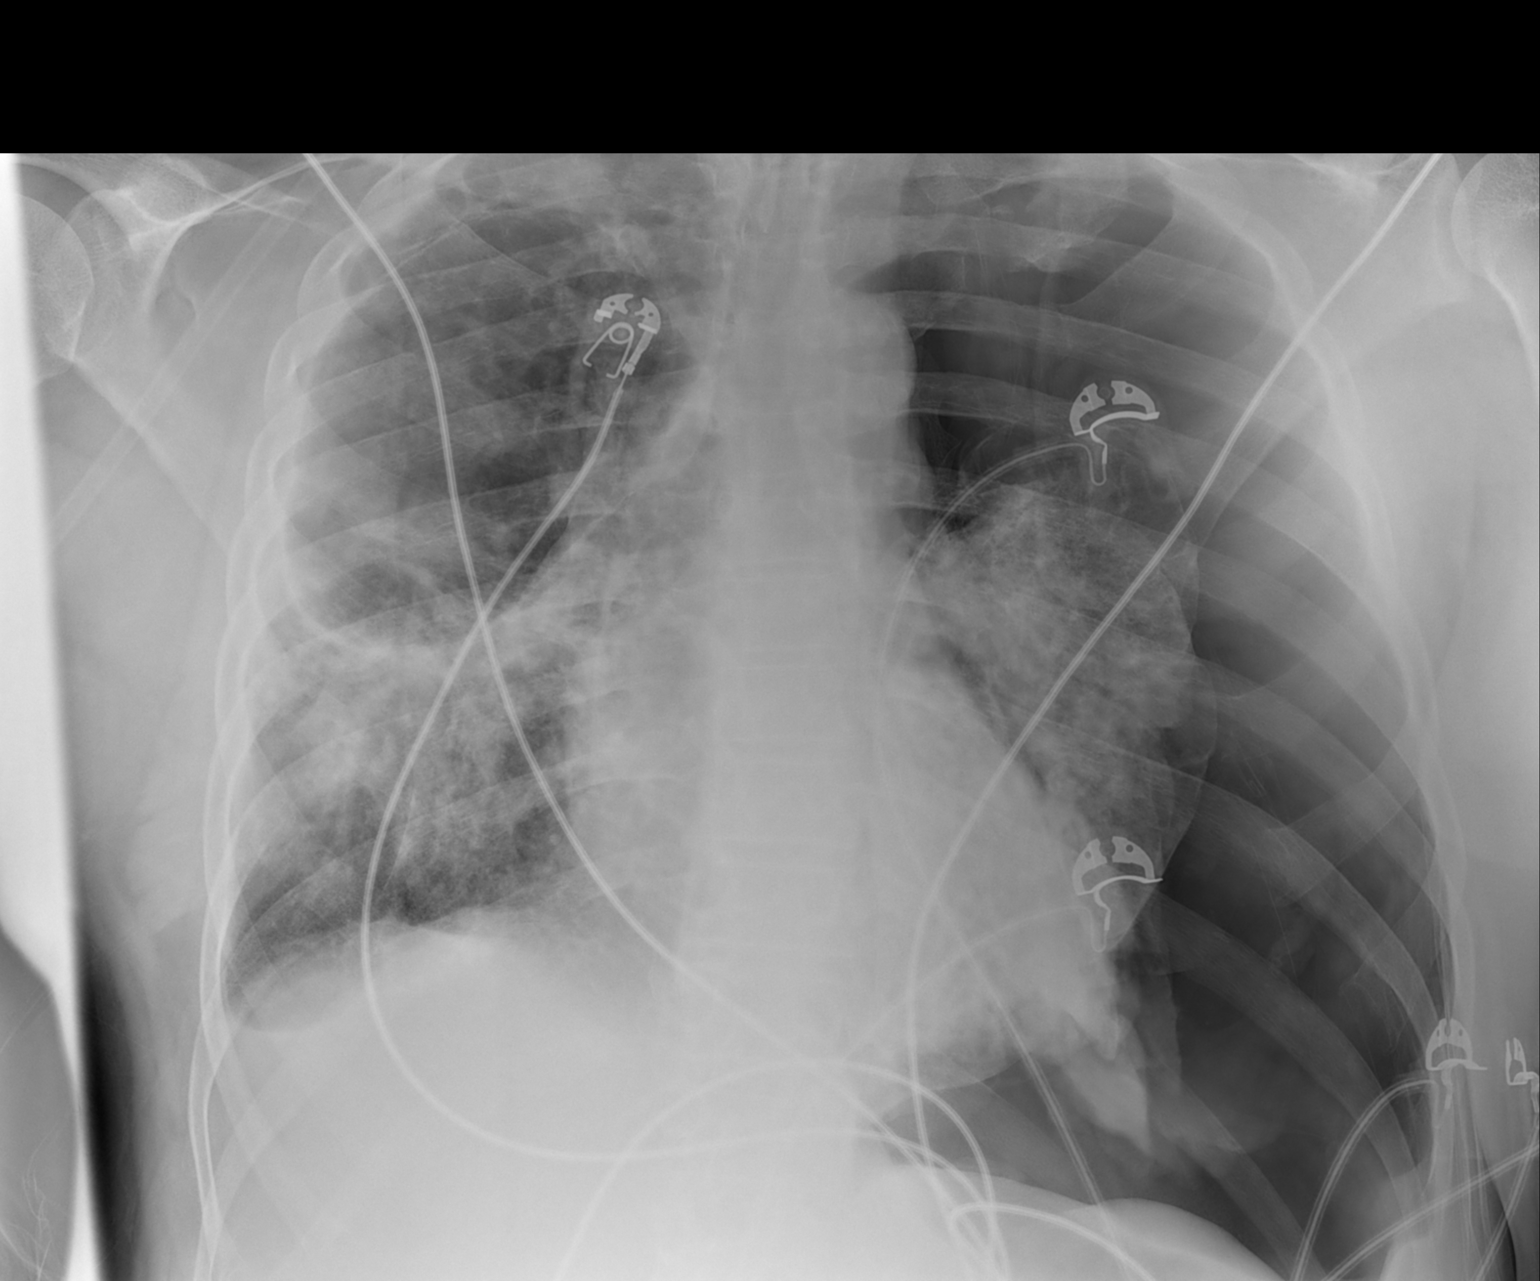

[1 of 1 positions shown; findings below may reference images not displayed]

FINDINGS: The patient is intubated.  The endotracheal tube
terminates 8.3 cm above the carina.  Heart size is upper limits of
normal.  A new large right to left sided pneumothorax is present.
Changes sarcoidosis are again seen bilaterally.  No definite
superimposed disease is present on the right.  A small right
pleural effusion is suspected.
IMPRESSION: 1.  New large left pneumothorax.
2.  Sarcoidosis.
3.  Question small right pleural effusion.
4.  Status post intubation.  Endotracheal tube is in satisfactory
position.

Critical Value/emergent results were called by telephone at the
time of interpretation on 07/10/2012 at [DATE] p.m. to Dr.
Melynda, who verbally acknowledged these results.

## 2013-09-20 IMAGING — DX DG CHEST 1V PORT
1 series · 1 of 1 positions shown · non-contrast
Comparison: Prior radiograph from earlier on the same day at 14 22

CLINICAL DATA: Respiratory distress, central line placement

PORTABLE CHEST - 1 VIEW

[AP]
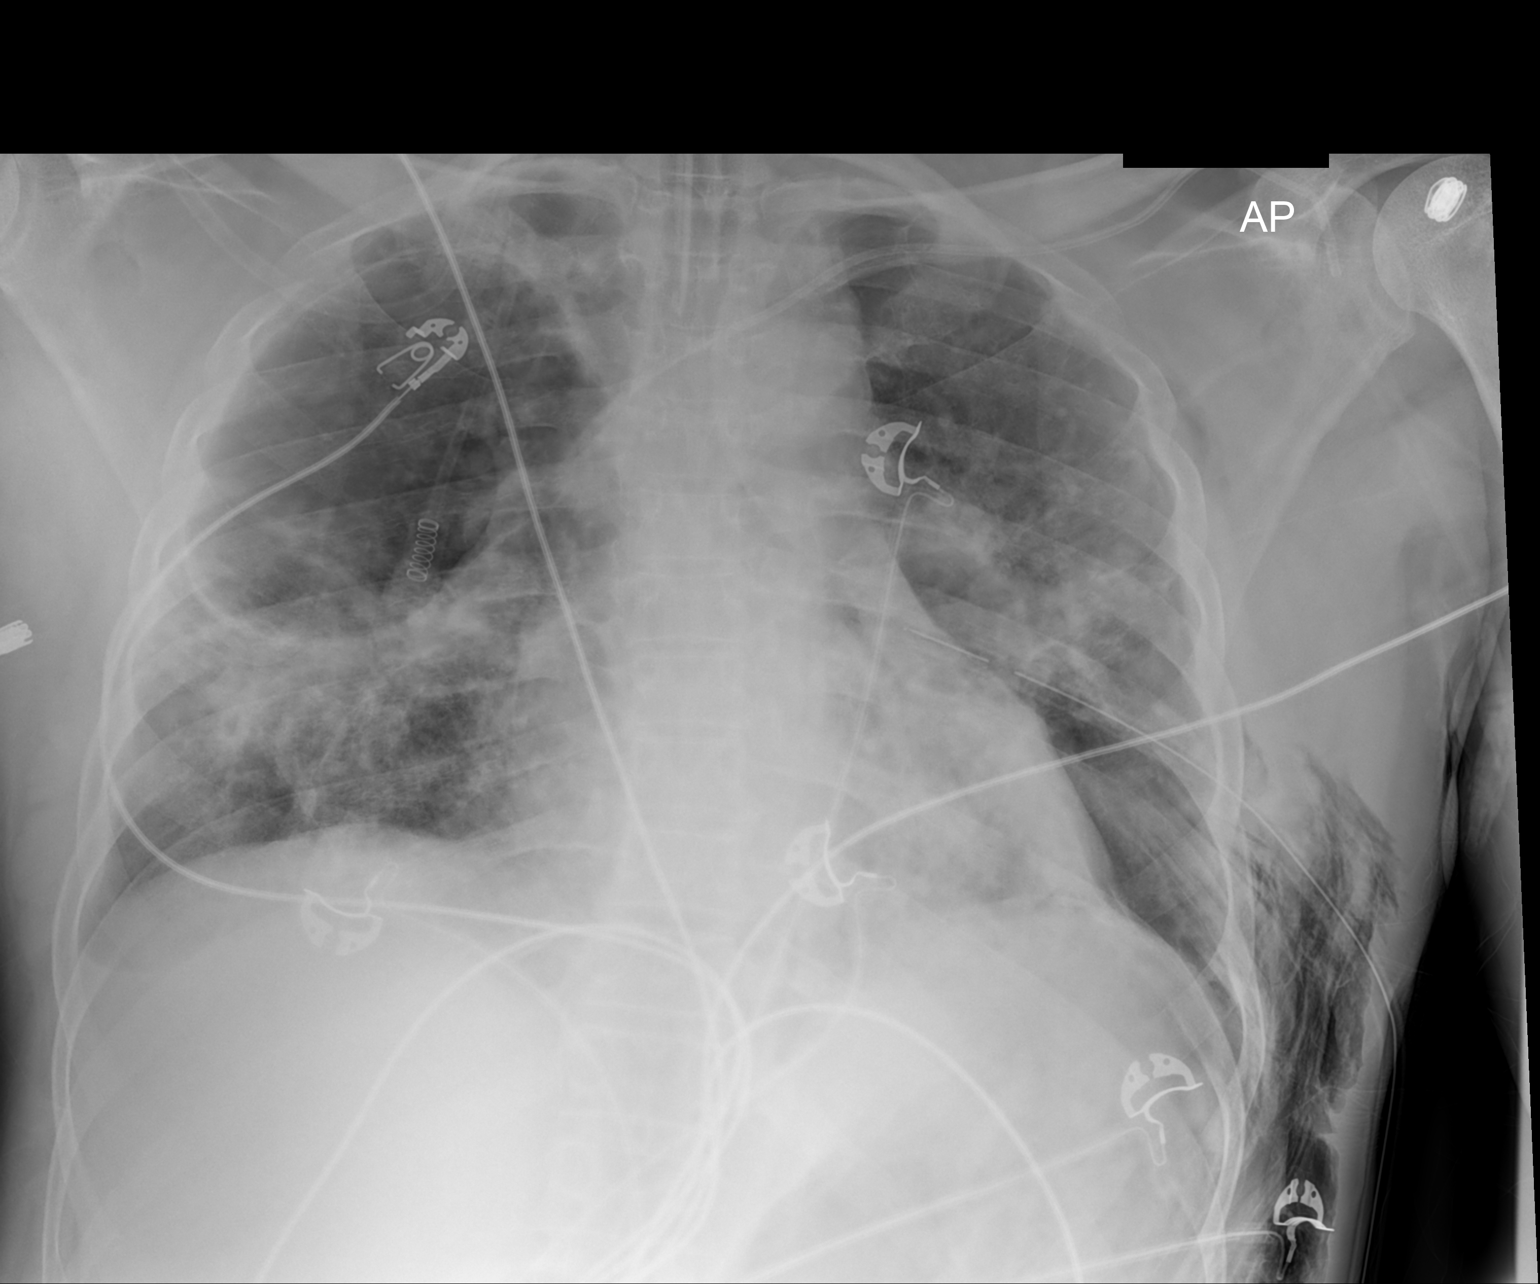

[1 of 1 positions shown; findings below may reference images not displayed]

FINDINGS: The study performed at [DATE], AP portable semi-upright

[There has been interval placement of a left subclavian central
venous catheter with tip projecting over the proximal SVC.
Endotracheal tube is normally positioned.  Left-sided chest tube is
stable.  Extensive subcutaneous emphysema within the left chest
wall is likely related to chest tube placement.

Cardiac and mediastinal silhouettes are unchanged.

The lungs remain hypoinflated.  There has been no significant
interval change in] spread bilateral parenchymal airspace
opacities.  Residual left pneumothorax persists.
IMPRESSION: 1.  Interval placement of left subclavian central venous line with
tip overlying the proximal SVC.

2. Stable lines and tubes as above.  Small residual right
pneumothorax, slightly decreased as compared to the prior exam.

3.  Stable appearance of bilateral pulmonary opacities.

## 2013-09-21 IMAGING — CR DG CHEST 1V PORT
1 series · 1 of 1 positions shown · non-contrast
Comparison: 07/10/2012.a

CLINICAL DATA: Evaluate endotracheal tube and lung fields.

PORTABLE CHEST - 1 VIEW

[AP]
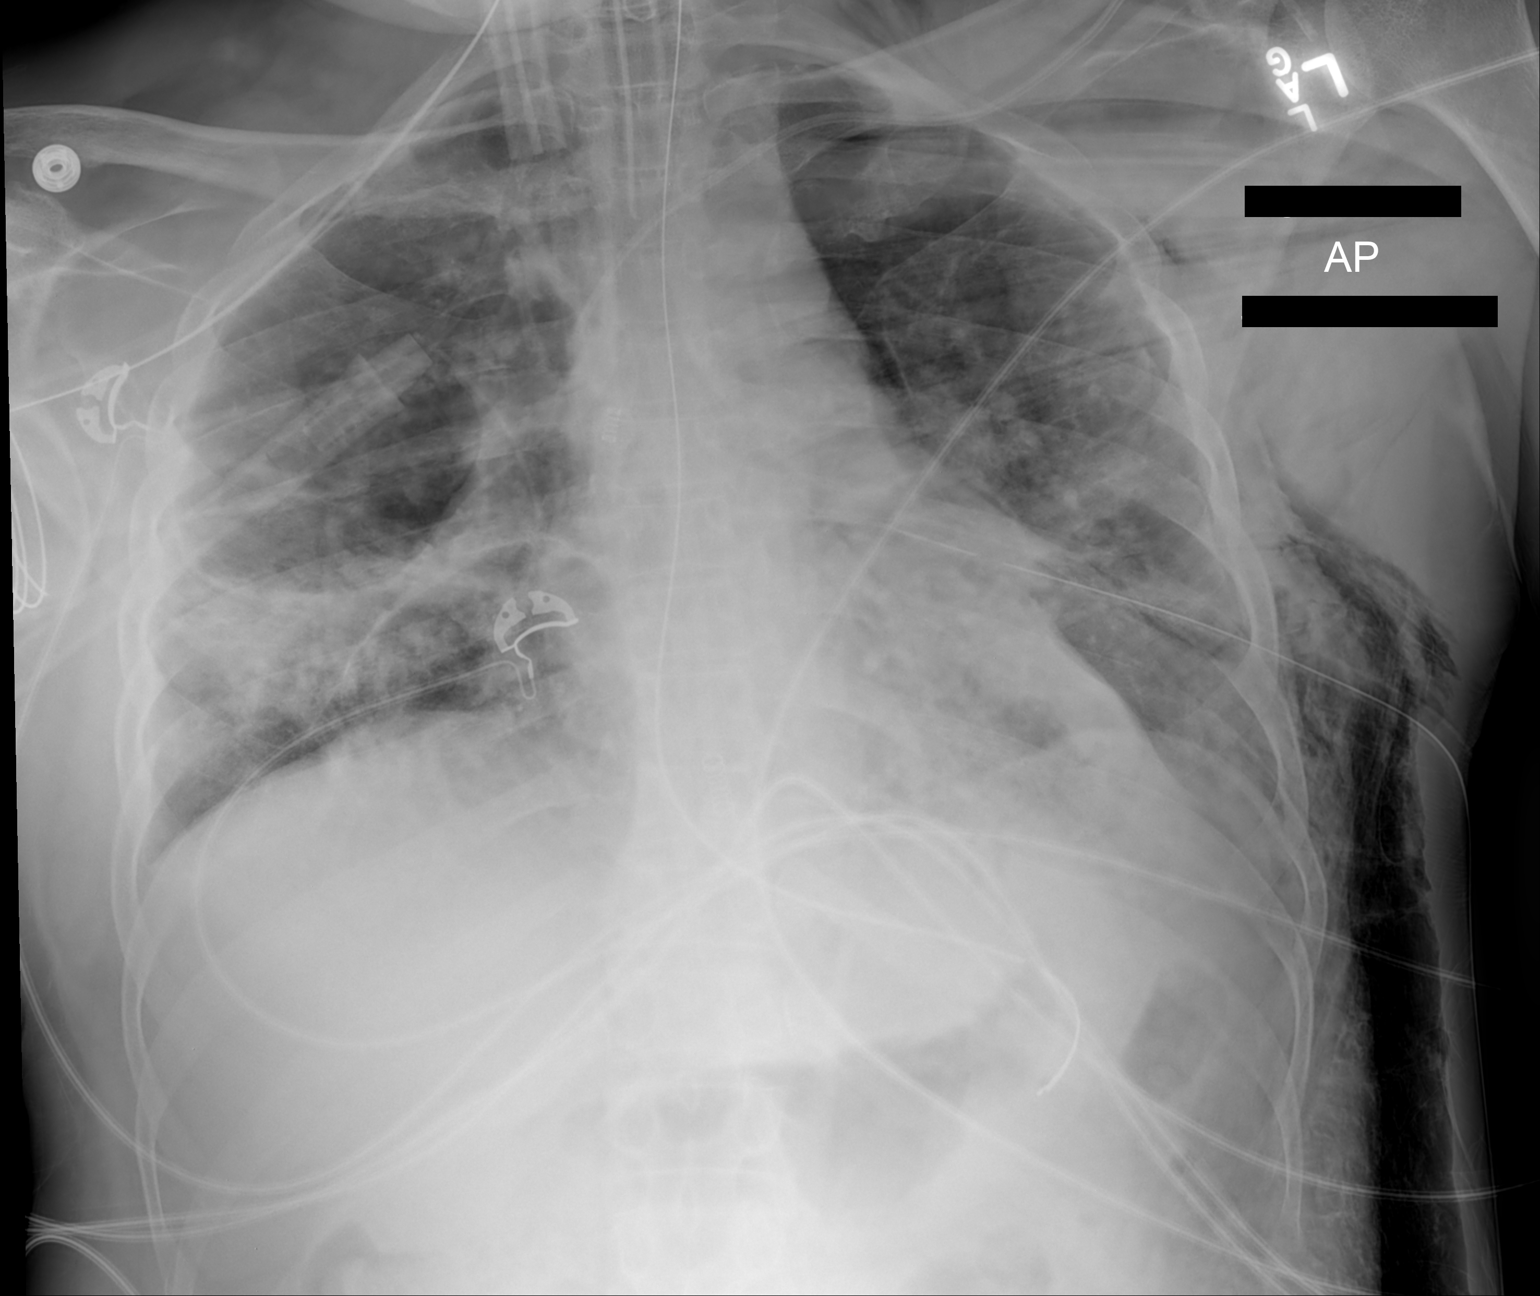

[1 of 1 positions shown; findings below may reference images not displayed]

FINDINGS: Small left apical pneumothorax remains present.
Endotracheal tube tip 5 cm from the carina.  Enteric tube in the
stomach.  Left subclavian central line present with the tip at the
confluence of the brachiocephalic veins.  Unchanged airspace
disease with underlying bullous disease.  Lung volumes appear
slightly lower than on the prior exam.  Left thoracostomy tube
unchanged.
IMPRESSION: Unchanged support apparatus.  Small left apical pneumothorax.
Unchanged bullous disease and bilateral airspace disease.

## 2013-09-22 IMAGING — CR DG CHEST 1V PORT
1 series · 1 of 1 positions shown · non-contrast
Comparison: Portable chest x-ray of 07/11/2012

CLINICAL DATA: Evaluate pneumothorax

PORTABLE CHEST - 1 VIEW

[AP]
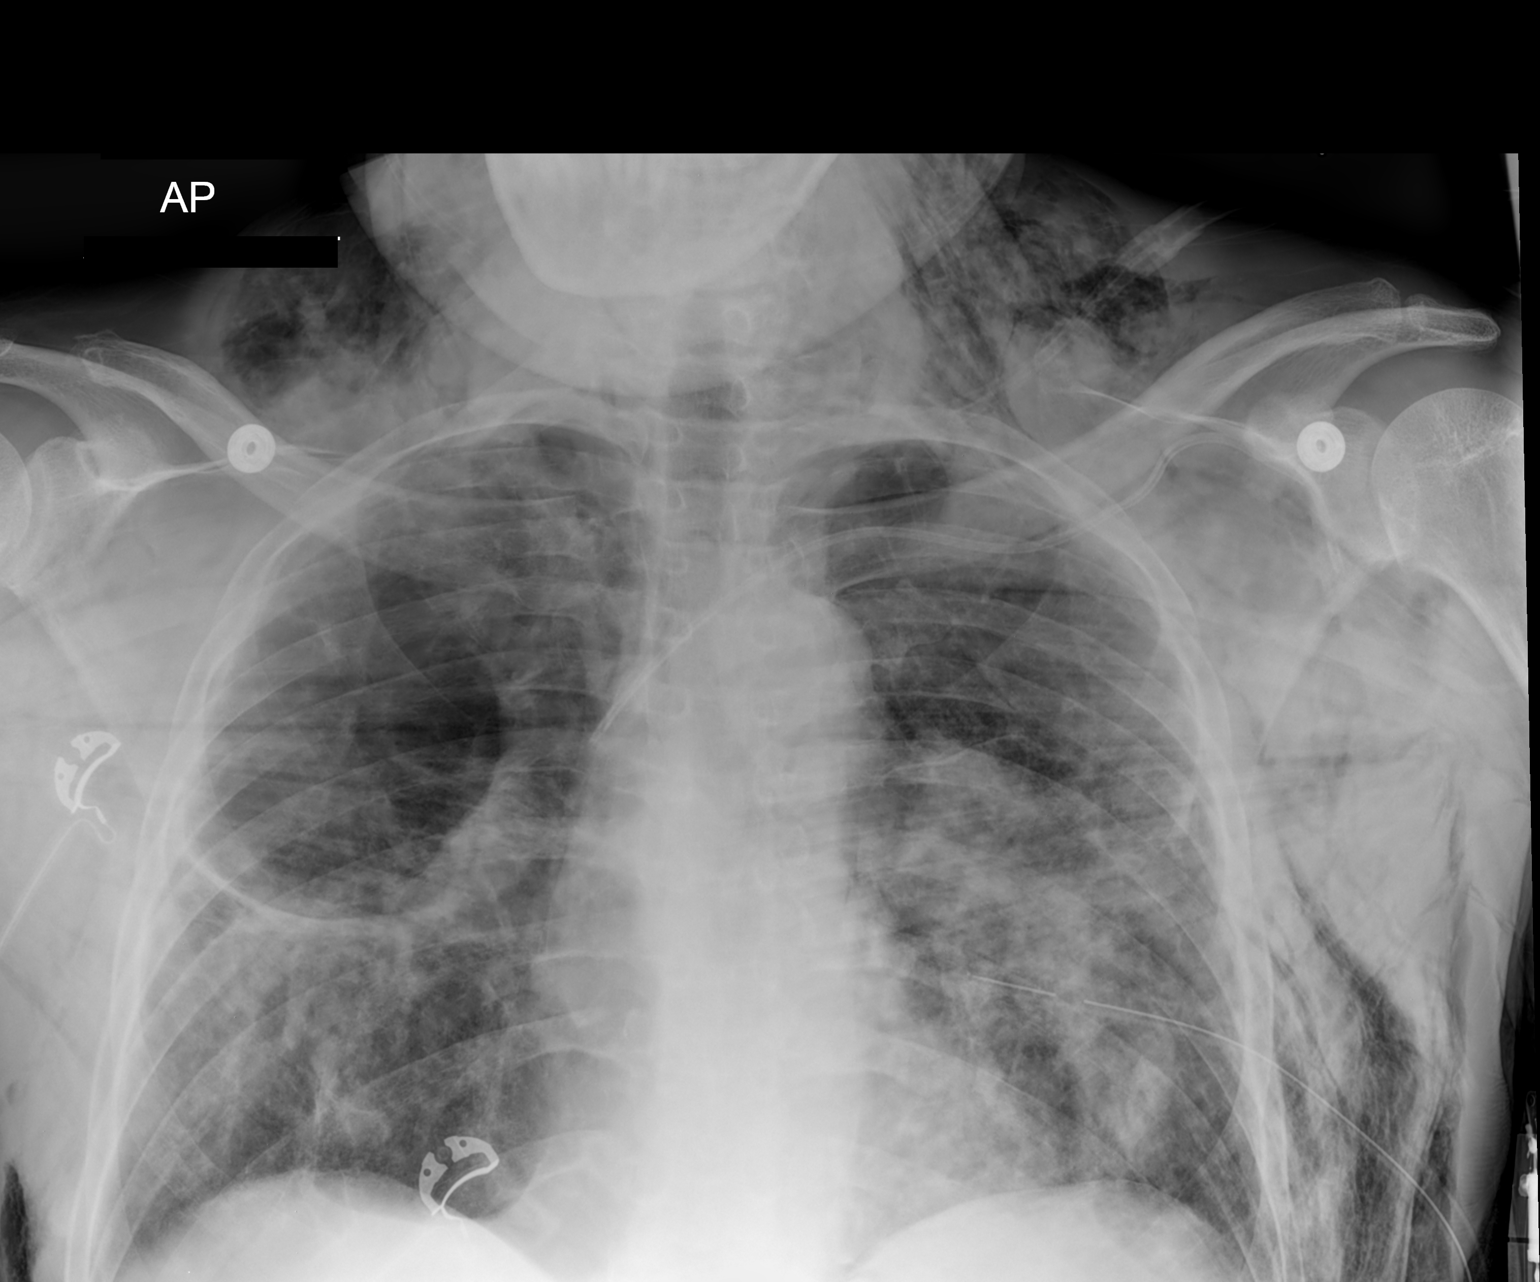

[1 of 1 positions shown; findings below may reference images not displayed]

FINDINGS: There is still a large amount of subcutaneous emphysema
within the chest wall and soft tissues of the lower neck.  No
definite pneumothorax is seen in left chest tube remains.  The
endotracheal tube has been removed, and the left central venous
line remains unchanged in position.
IMPRESSION: 1.  The endotracheal tube has been removed.  Perhaps slightly
better aeration.
2.  Diffuse subcutaneous emphysema.  No pneumothorax.  Left chest
tube remains.

## 2013-09-23 IMAGING — CR DG CHEST 1V PORT
1 series · 1 of 1 positions shown · non-contrast
Comparison: 07/12/2012.

CLINICAL DATA: Follow up left pneumothorax.

PORTABLE CHEST - 1 VIEW

[AP]
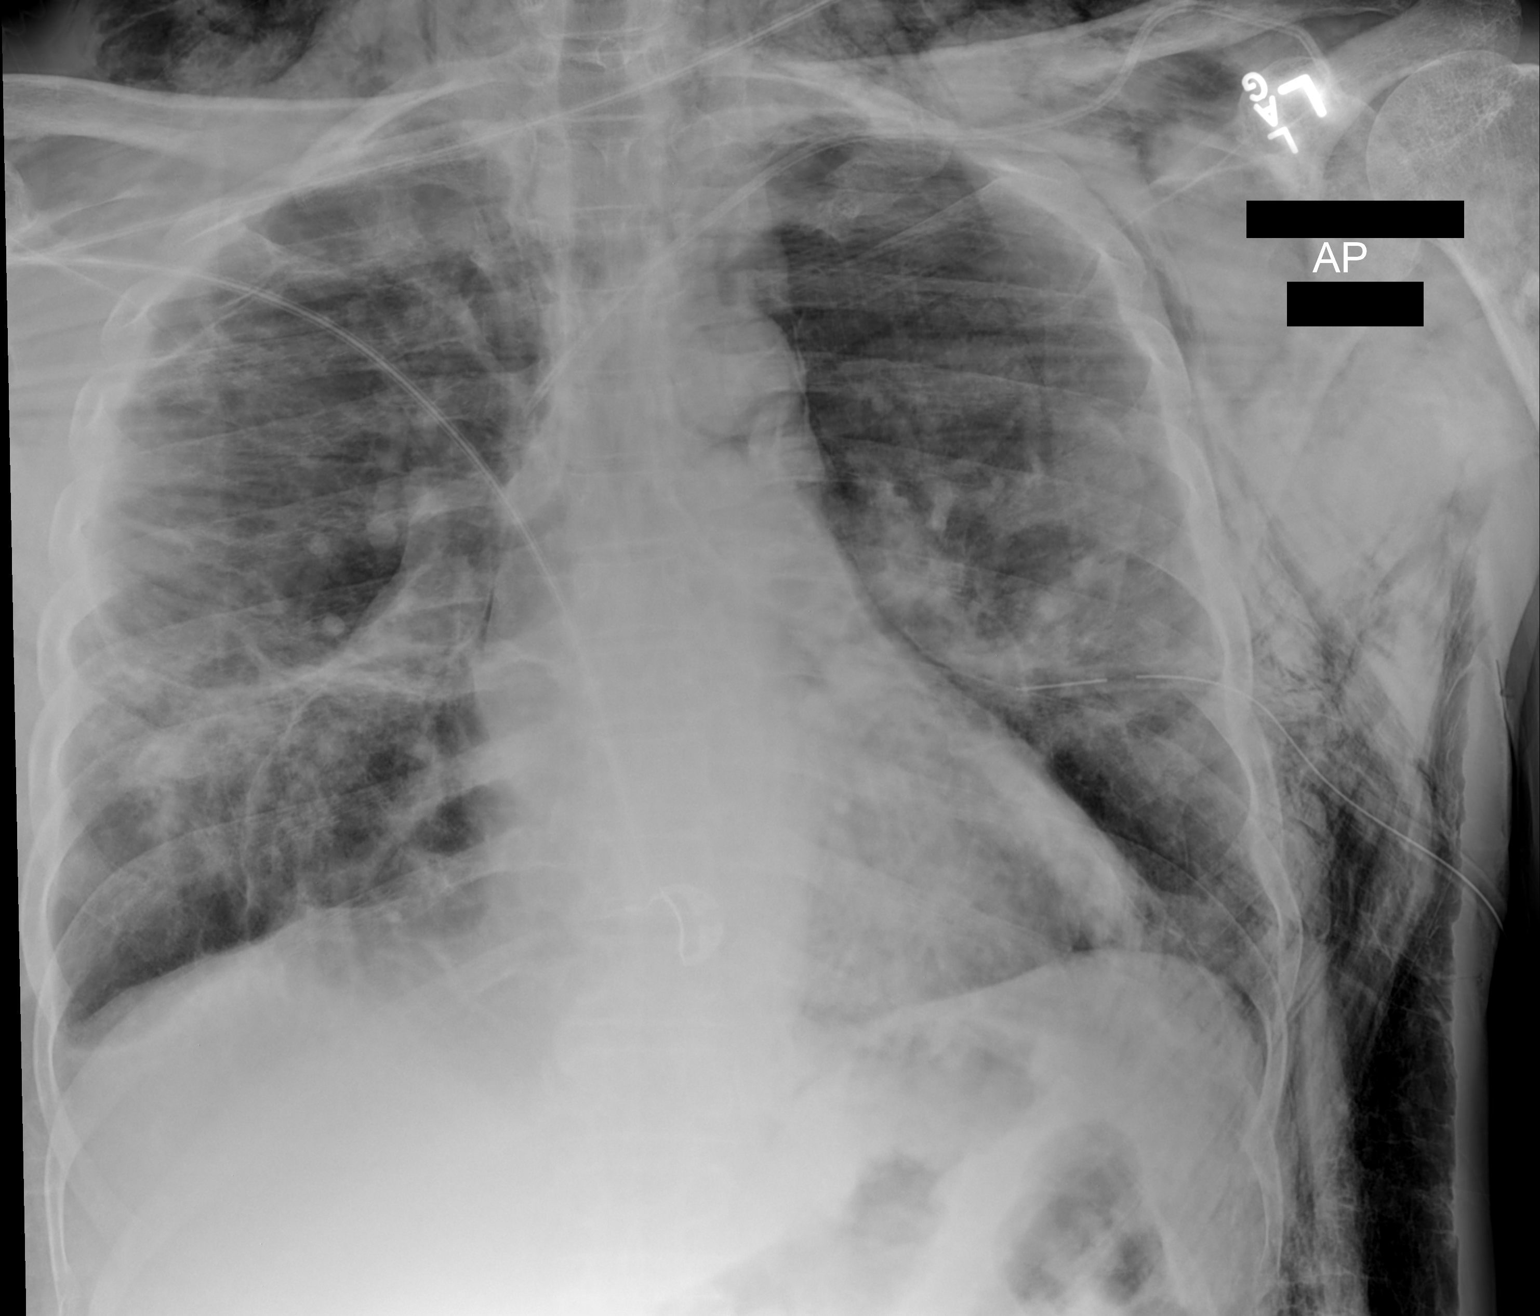

[1 of 1 positions shown; findings below may reference images not displayed]

FINDINGS: Chest radiograph is essentially unchanged compared to
prior exam.  Left thoracostomy tube remains present.  There is no
pleural line visualized. Support apparatus is unchanged.
Pneumomediastinum is present, with gas outlining the
cardiopericardial silhouette and aortic arch.
IMPRESSION: 1.  Unchanged support apparatus.
2.  Diffuse soft tissue emphysema.  No definite pneumothorax
visualized.
3.  Small amount of pneumomediastinum, not unexpected given the
amount of soft tissue emphysema.

## 2013-09-24 IMAGING — CR DG CHEST 1V PORT
1 series · 1 of 1 positions shown · non-contrast
Comparison: Prior chest x-ray 07/13/2012; prior CT chest 06/27/2012

CLINICAL DATA: Pneumothorax, chest tube, short of breath

PORTABLE CHEST - 1 VIEW

[AP]
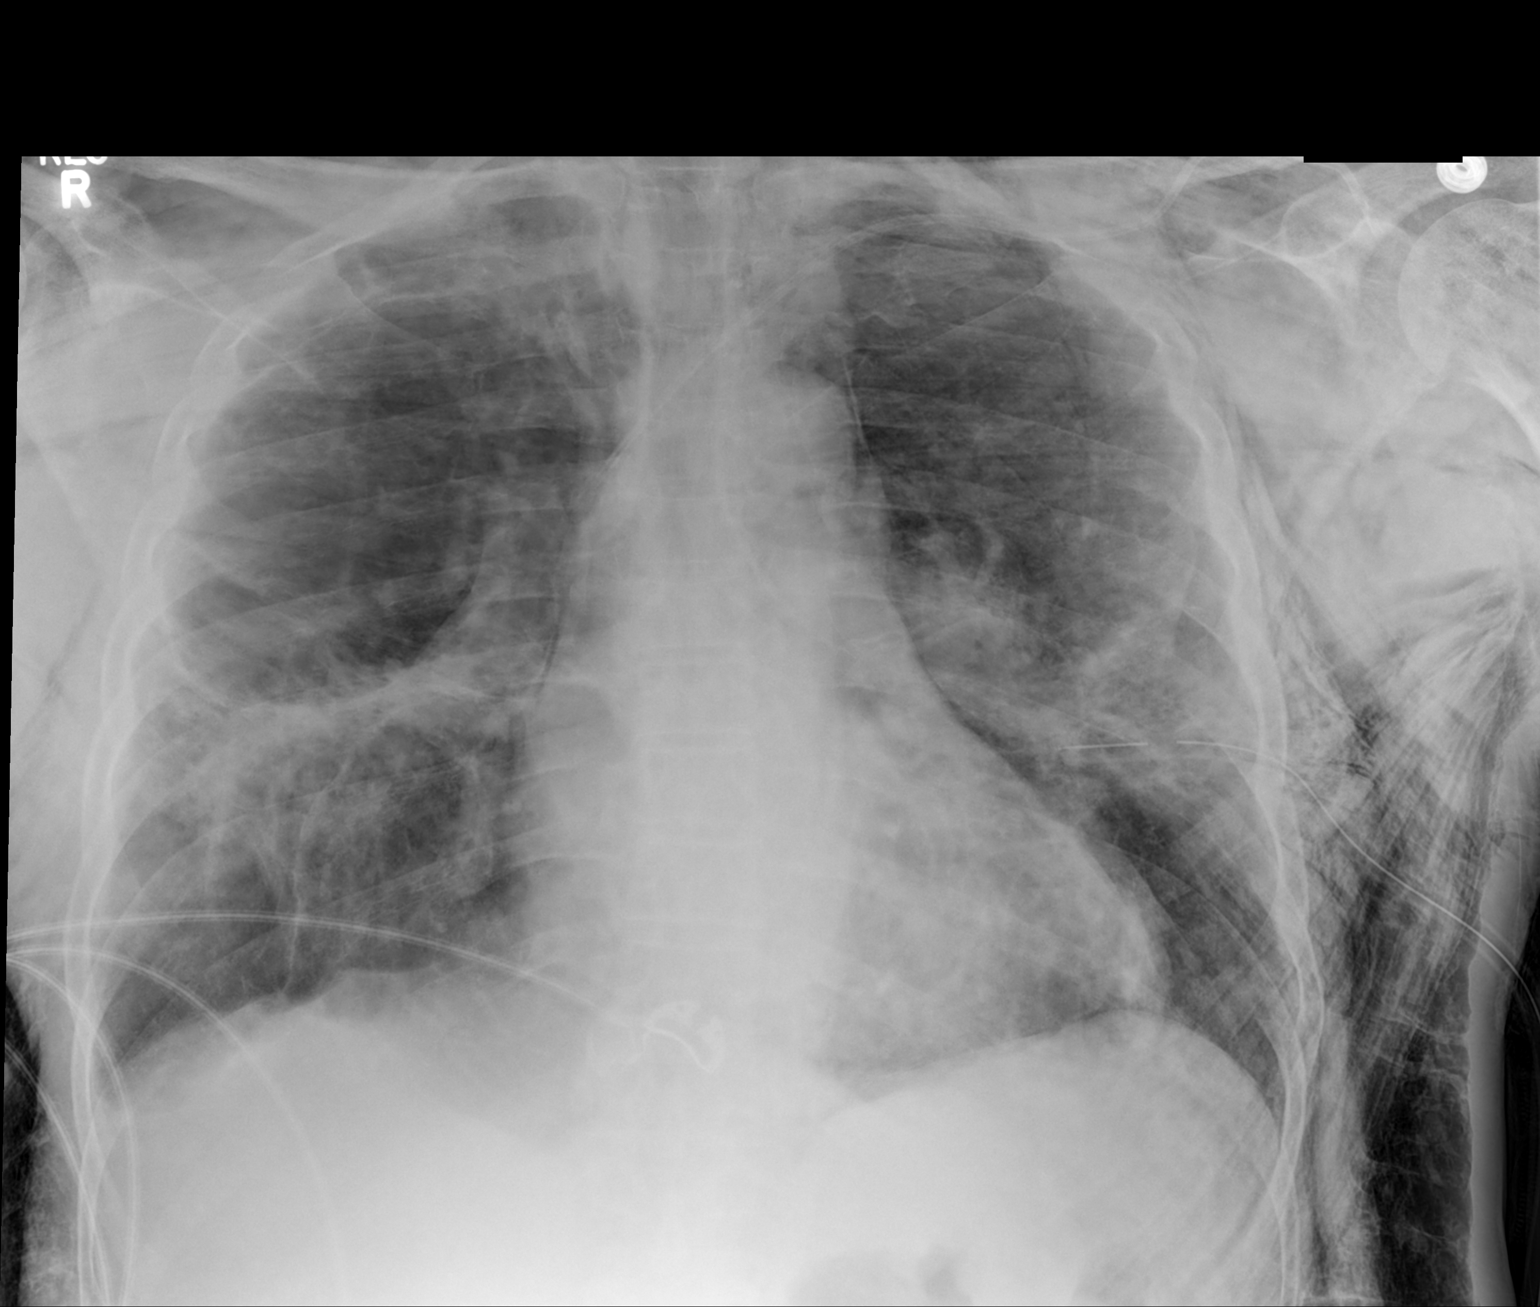

[1 of 1 positions shown; findings below may reference images not displayed]

FINDINGS: Massive subcutaneous emphysema overlying the left greater
than right chest and soft tissues of the upper neck slightly limits
evaluation of the underlying lung fields including evaluation for
residual small pneumothorax.

Stable position of left subclavian approach central venous catheter
with the tip at the confluence of the left innominate vein and
superior vena cava.  The left thoracostomy tube is in unchanged
position.  Pneumomediastinum is unchanged.  There is been no
significant interval change in the appearance of the lung fields.
No definite residual pneumothorax.  Persistent large right bulla in
the right upper lung with sequela of sarcoidosis and scarring in
the mid lungs bilaterally.  Stable cardiac and mediastinal
contours.
IMPRESSION: 1.  No significant interval change in the appearance of the chest.
Persistent pneumomediastinum.  No definite pneumothorax identified.
2.  Persistent massive subcutaneous emphysema slightly limits
evaluation of the underlying lung fields.
3.  Stable and satisfactory support apparatus.

## 2013-09-25 IMAGING — CR DG CHEST 1V PORT
1 series · 1 of 1 positions shown · non-contrast
Comparison: 07/14/2012

CLINICAL DATA: Follow-up pneumothorax.  Chest tube.

PORTABLE CHEST - 1 VIEW

[AP]
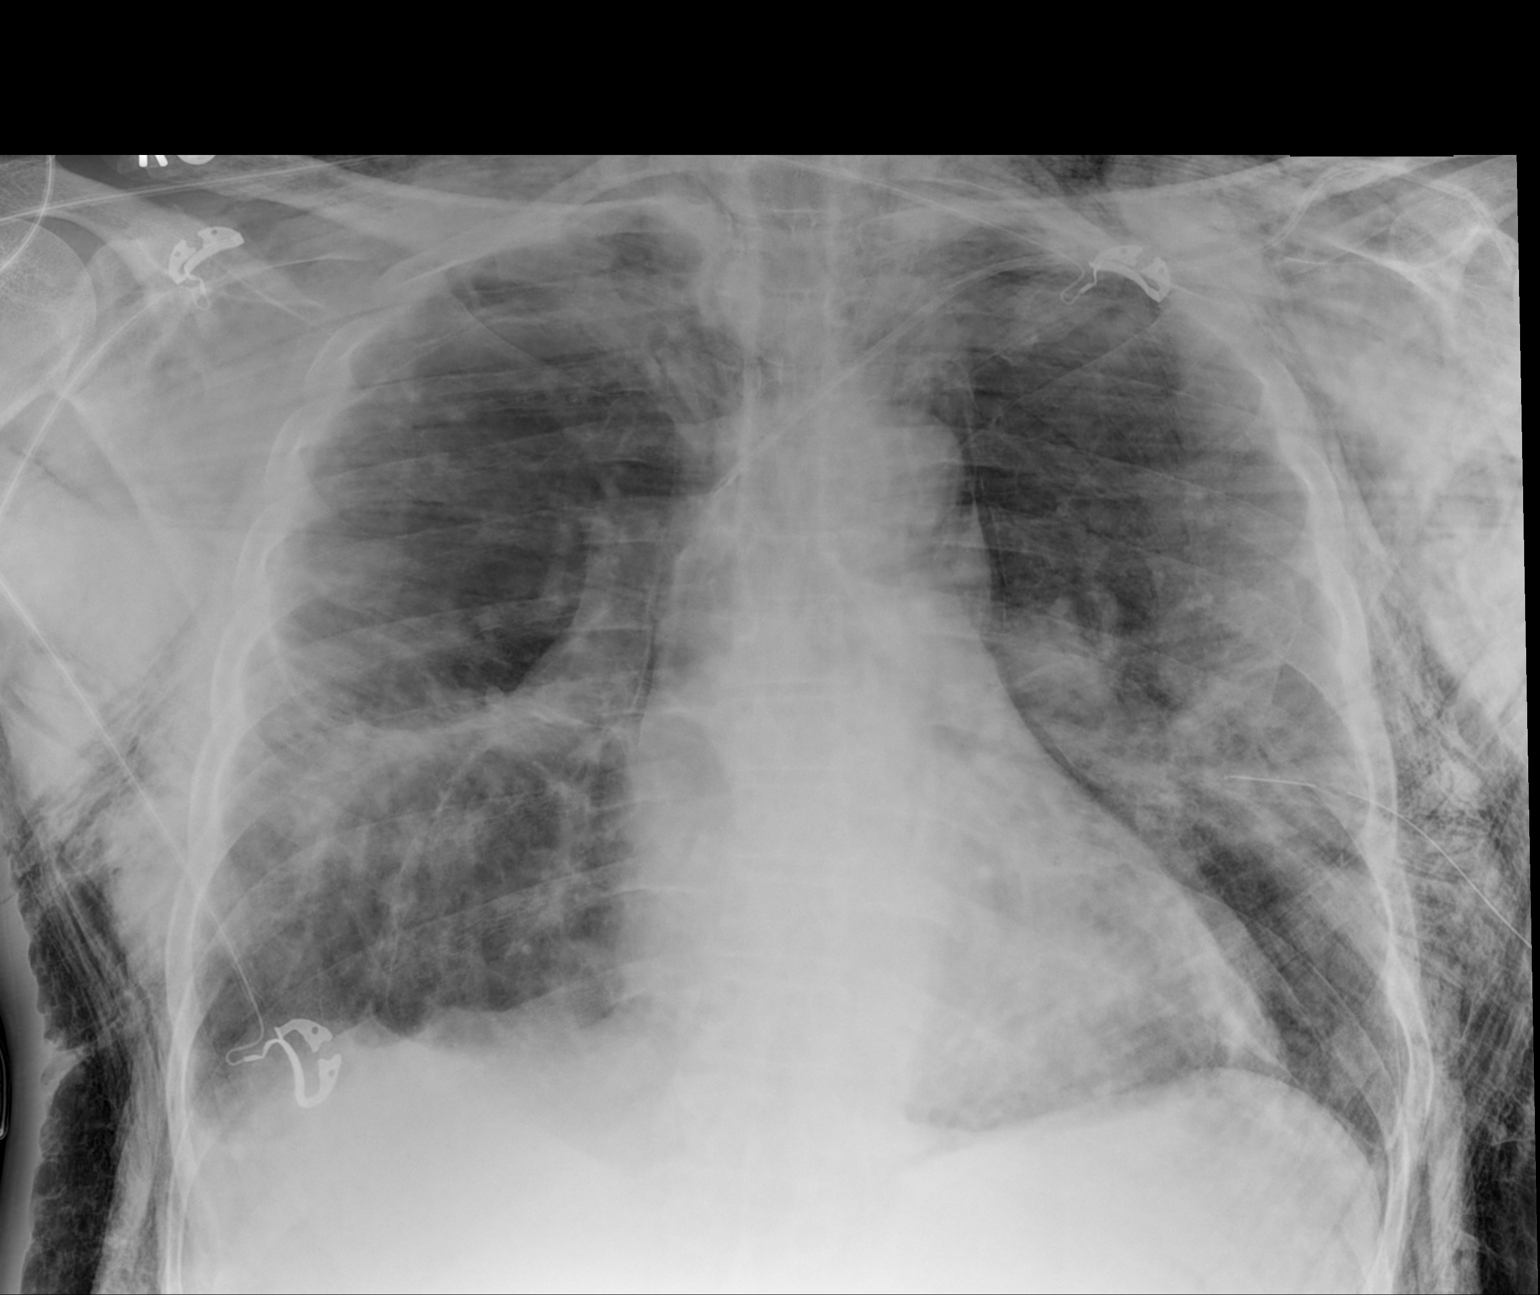

[1 of 1 positions shown; findings below may reference images not displayed]

FINDINGS: Left chest tube appears to have pulled back in location
since the previous study although this may just be due to
differences in patient positioning.  Persistent small left pleural
effusion and pneumomediastinum with extensive subcutaneous
emphysema throughout the chest bilaterally.  Heart size and
pulmonary vascularity are normal.  Stable location of left central
venous catheter.
IMPRESSION: Small left pneumothorax, pneumomediastinum, appear unchanged.  Left
chest tube may have pulled back somewhat since previous study.

## 2013-09-26 IMAGING — CR DG CHEST 1V PORT
1 series · 1 of 1 positions shown · non-contrast
Comparison: Prior chest x-ray 07/15/2012

CLINICAL DATA: Chest tube, follow up pneumothorax

PORTABLE CHEST - 1 VIEW

[AP]
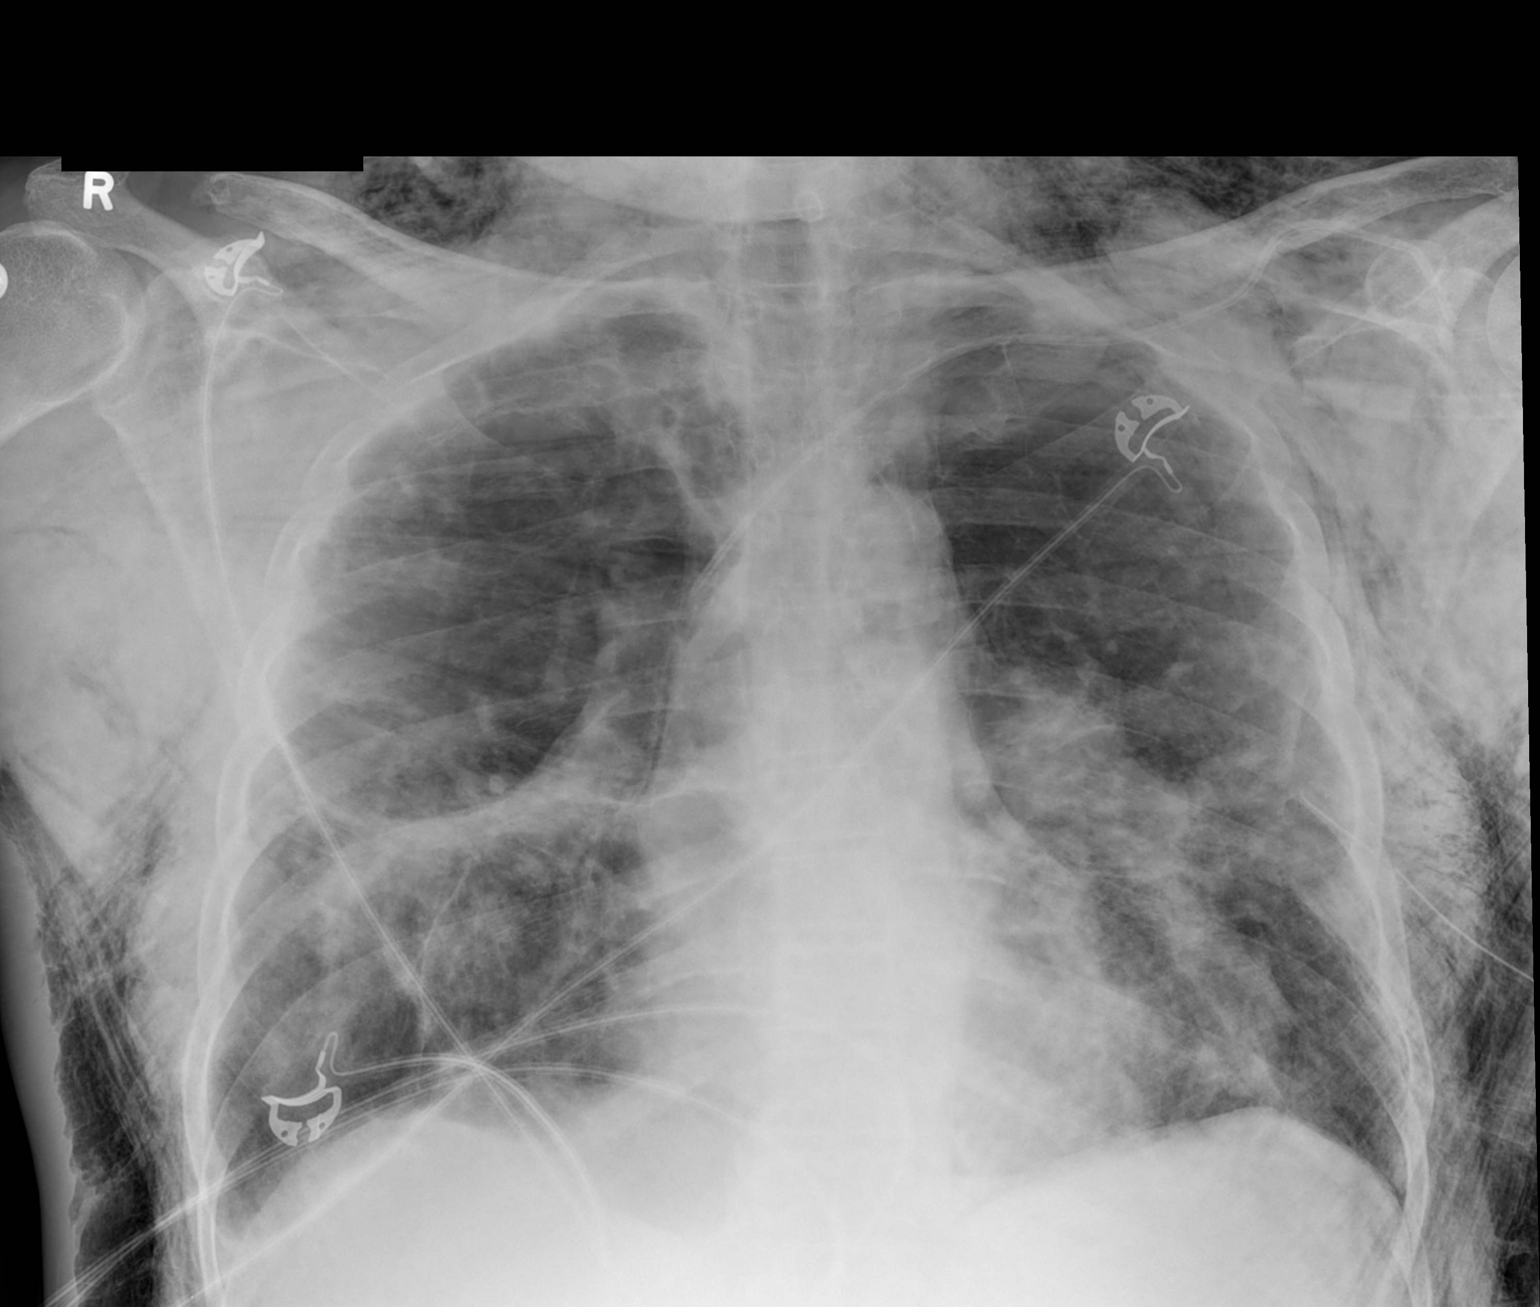

[1 of 1 positions shown; findings below may reference images not displayed]

FINDINGS: The left thoracostomy tube continues to retract, the
proximal side hole is now within the soft tissues of the thoracic
wall.  The tip of the tube remains intrathoracic.  Stable position
of left subclavian approach central venous catheter with the tip at
the confluence of the left innominate vein and superior vena cava.
Massive subcutaneous emphysema remains present and continues to
obscure evaluation of the underlying lung parenchyma. The small
left pneumothorax and mediastinum appear grossly unchanged.  Stable
severe background emphysema with right mid and upper lung bullous
change.
IMPRESSION: 1.  The left-sided thoracostomy tube continues to retract, the
proximal side hole is now in the soft tissues of the chest wall.
The tip of the thoracostomy tube remains intrathoracic.
2.  Grossly stable small left pneumothorax and pneumomediastinum
given limitations of extensive overlying subcutaneous emphysema.

to the patient's nurse, Bilek, who verbally acknowledged these
results.

## 2013-09-27 IMAGING — CR DG CHEST 1V PORT
1 series · 1 of 1 positions shown · non-contrast
Comparison: 07/16/2012

CLINICAL DATA: Respiratory failure.  Left chest tube.

PORTABLE CHEST - 1 VIEW

[AP]
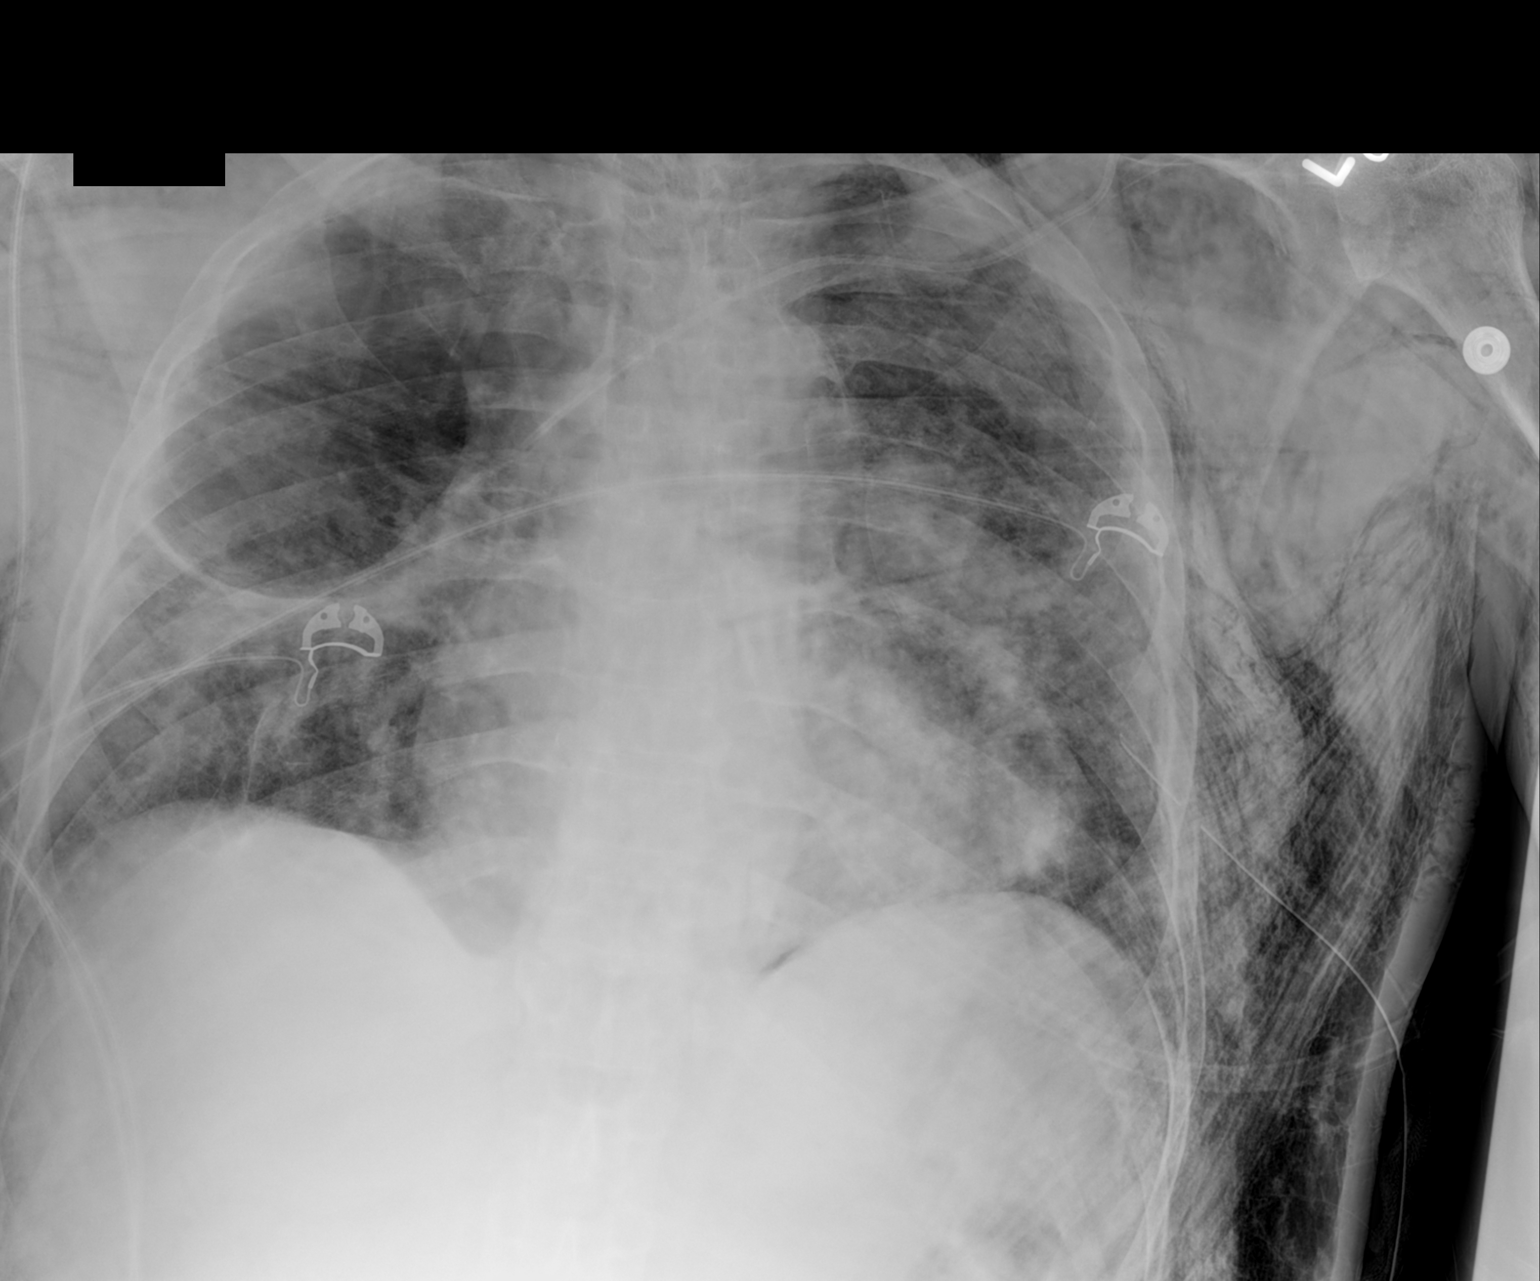

[1 of 1 positions shown; findings below may reference images not displayed]

FINDINGS: Left chest tube remains unchanged in position, again the
proximal side hole appears to be in the soft tissues outside of the
pleural space.  There is extensive subcutaneous emphysema
throughout the chest.  No definite pneumothorax although the
subcutaneous emphysema may obscure small pneumothorax.  Shallow
inspiration.  Cardiac enlargement.  Infiltration or atelectasis in
the mid lungs.
IMPRESSION: Proximal side hole of the left chest tube appears to be in the soft
tissues outside of the pleural space.  Extensive subcutaneous
emphysema.  Infiltration or atelectasis in the mid lungs.  Stable
appearance since previous study.

## 2013-09-27 IMAGING — CR DG CHEST 1V PORT
1 series · 1 of 1 positions shown · non-contrast
Comparison: 07/17/2012

CLINICAL DATA: , postop, vats

PORTABLE CHEST - 1 VIEW

[AP]
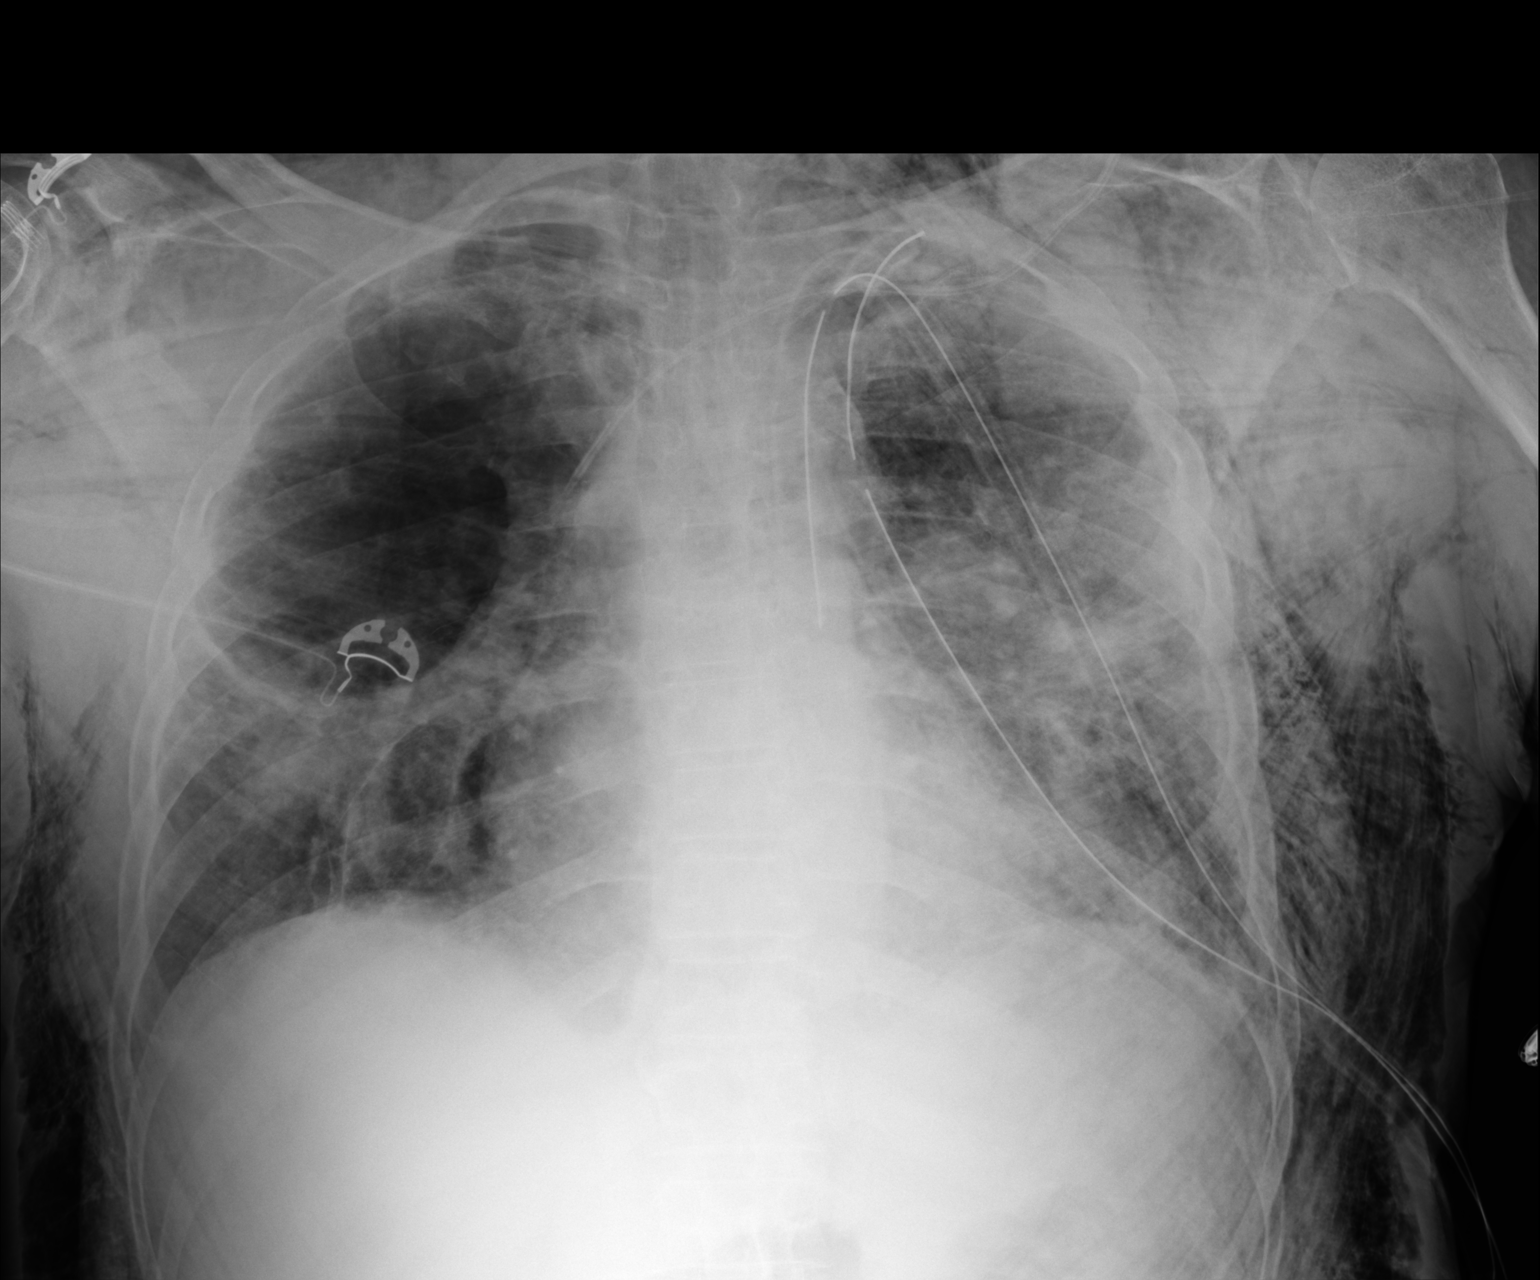

[1 of 1 positions shown; findings below may reference images not displayed]

FINDINGS: There are two chest tubes identified on the left. These
have been advanced such that the side ports are well within the
lateral margin of the left chest wall.  No pneumothorax is
visualized.  There is a left subclavian catheter with tip in the
projection of the SVC.  Extensive  emphysema is identified
throughout both sides of the chest wall.  This appears similar to
previous exam.
IMPRESSION: 1.  Interval repositioning of the left chest tubes such that the
side ports are well within the margins of the chest wall.
2.  No change and chest wall emphysema.

## 2013-09-28 IMAGING — DX DG CHEST 1V PORT
1 series · 1 of 1 positions shown · non-contrast
Comparison: 07/17/2012; 07/16/2012; 07/15/2012; 07/14/2012; chest
CT - 06/27/2012

CLINICAL DATA: Left-sided chest tube placement, history of
sarcoidosis

PORTABLE CHEST - 1 VIEW

[portable]
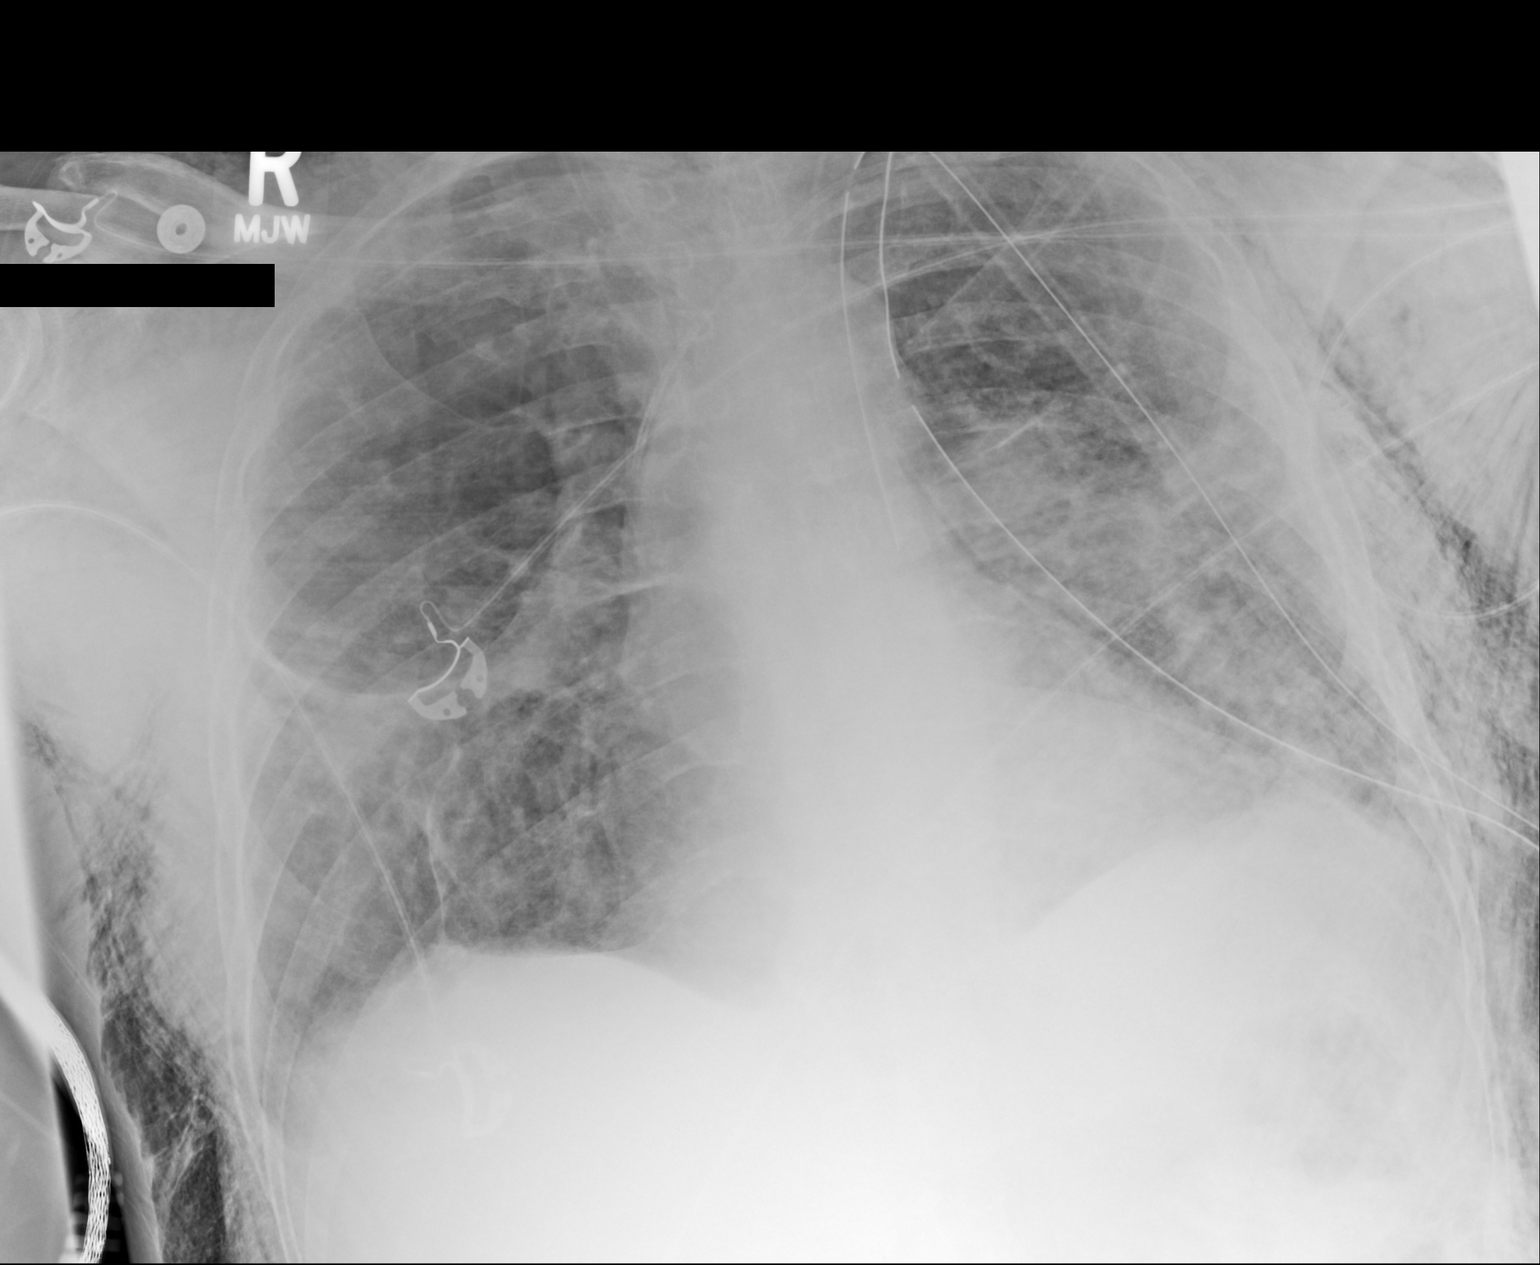

[1 of 1 positions shown; findings below may reference images not displayed]

FINDINGS: Grossly unchanged cardiac silhouette and mediastinal contours.
Stable positioning of support apparatus.  An extensive amount of
subcutaneous emphysema about the chest is grossly unchanged and
again degrades evaluation of the underlying pulmonary parenchyma.
Grossly unchanged perihilar and bibasilar coarse heterogeneous
opacities, left greater than right.  No new focal airspace
opacities.  Stable bullous emphysematous change involving the right
upper lung without definite pneumothorax.  Unchanged bones.
IMPRESSION: 1.  Stable positioning of support apparatus.  Stable findings of
bullous emphysematous change of the right upper lung without
definite pneumothorax.  No pneumothorax.

2.  Grossly unchanged large amount of subcutaneous emphysema about
the chest. pulmonary parenchyma.

3.  Grossly unchanged perihilar and bibasilar heterogeneous
opacities, left greater than right.

## 2013-09-29 IMAGING — DX DG CHEST 1V PORT
1 series · 1 of 1 positions shown · non-contrast
Comparison: Portable chest x-ray of 07/18/2012

CLINICAL DATA: Post VATS, history of sarcoidosis

PORTABLE CHEST - 1 VIEW

[portable]
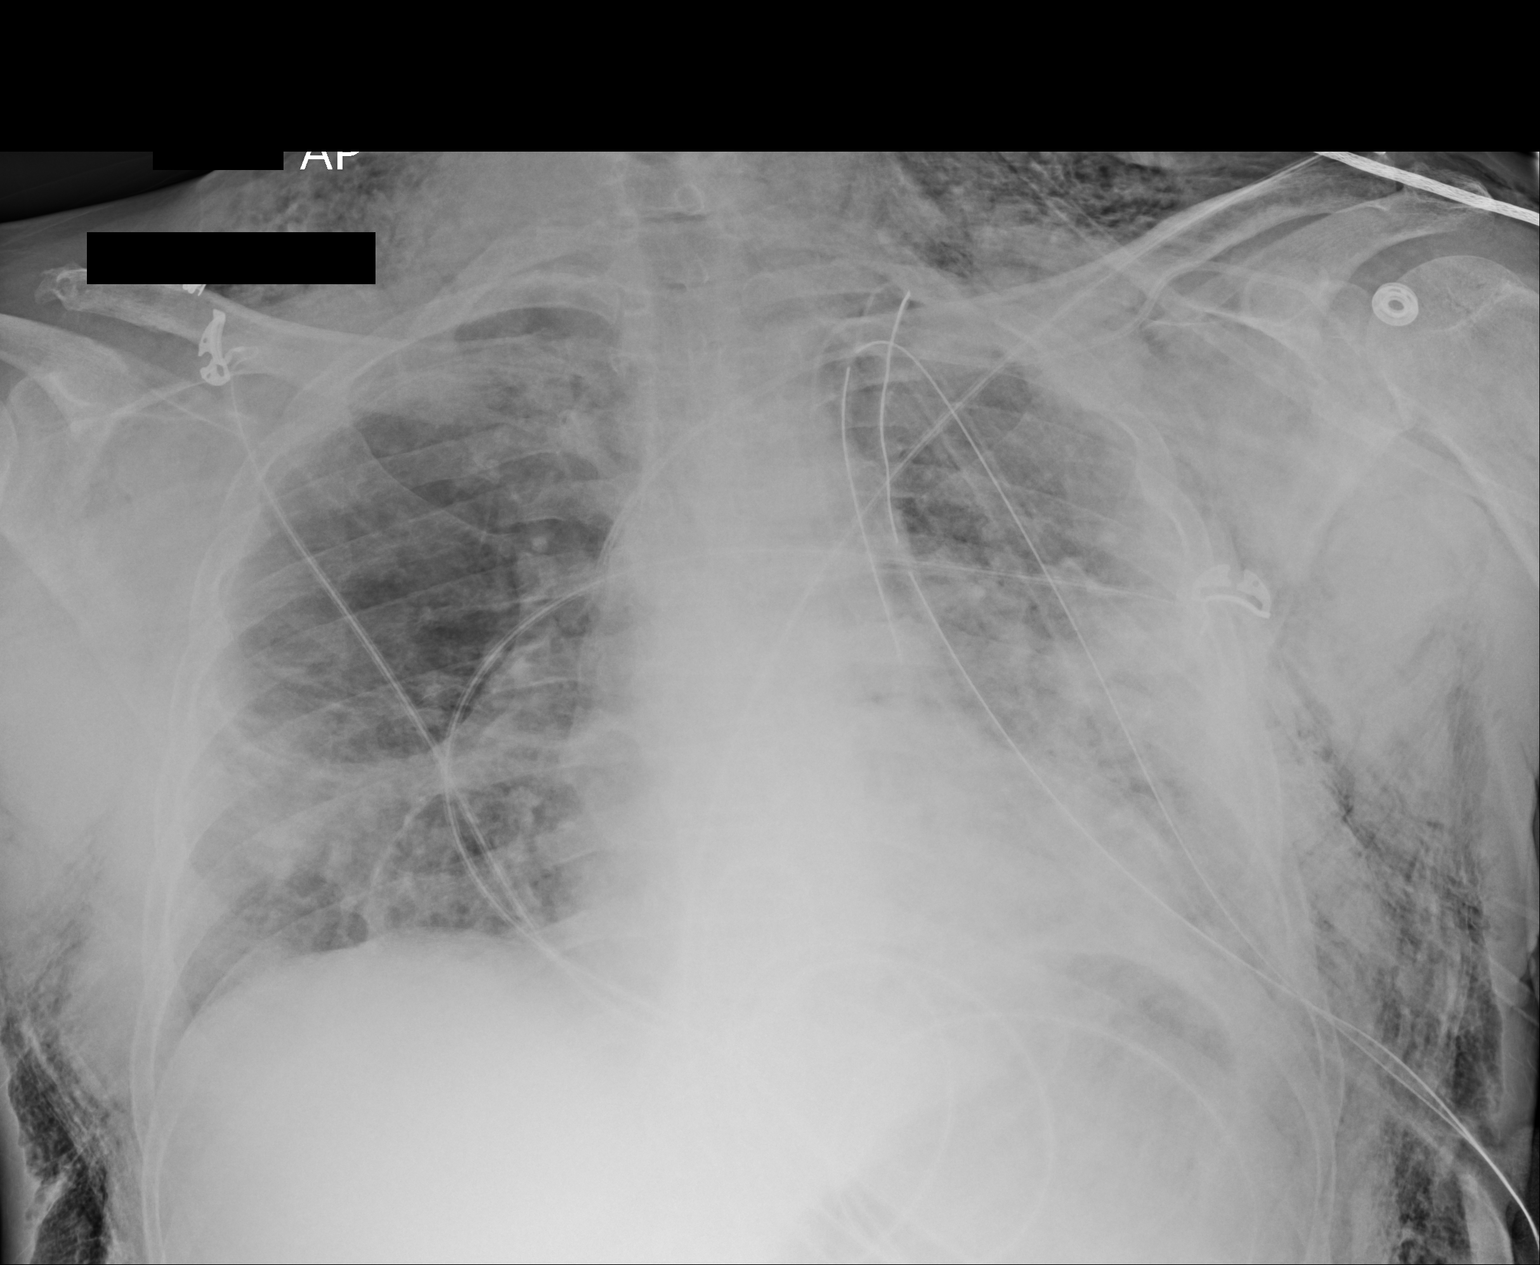

[1 of 1 positions shown; findings below may reference images not displayed]

FINDINGS: The left chest tubes remain and no definite left
pneumothorax is seen.  Considerable left chest wall and neck
subcutaneous air is noted.  The right basilar atelectasis remains.
The left central venous line tip is unchanged in position with mild
cardiomegaly present.
IMPRESSION: 1.  No change in poor aeration with basilar atelectasis.  No
definite pneumothorax with left chest tubes present.
2.  Diffuse subcutaneous emphysema throughout the left chest wall
and neck.

## 2013-09-30 IMAGING — DX DG CHEST 1V PORT
1 series · 1 of 1 positions shown · non-contrast
Comparison: Prior chest x-ray 07/19/2012

CLINICAL DATA: Chest tube, sarcoidosis

PORTABLE CHEST - 1 VIEW

[portable]
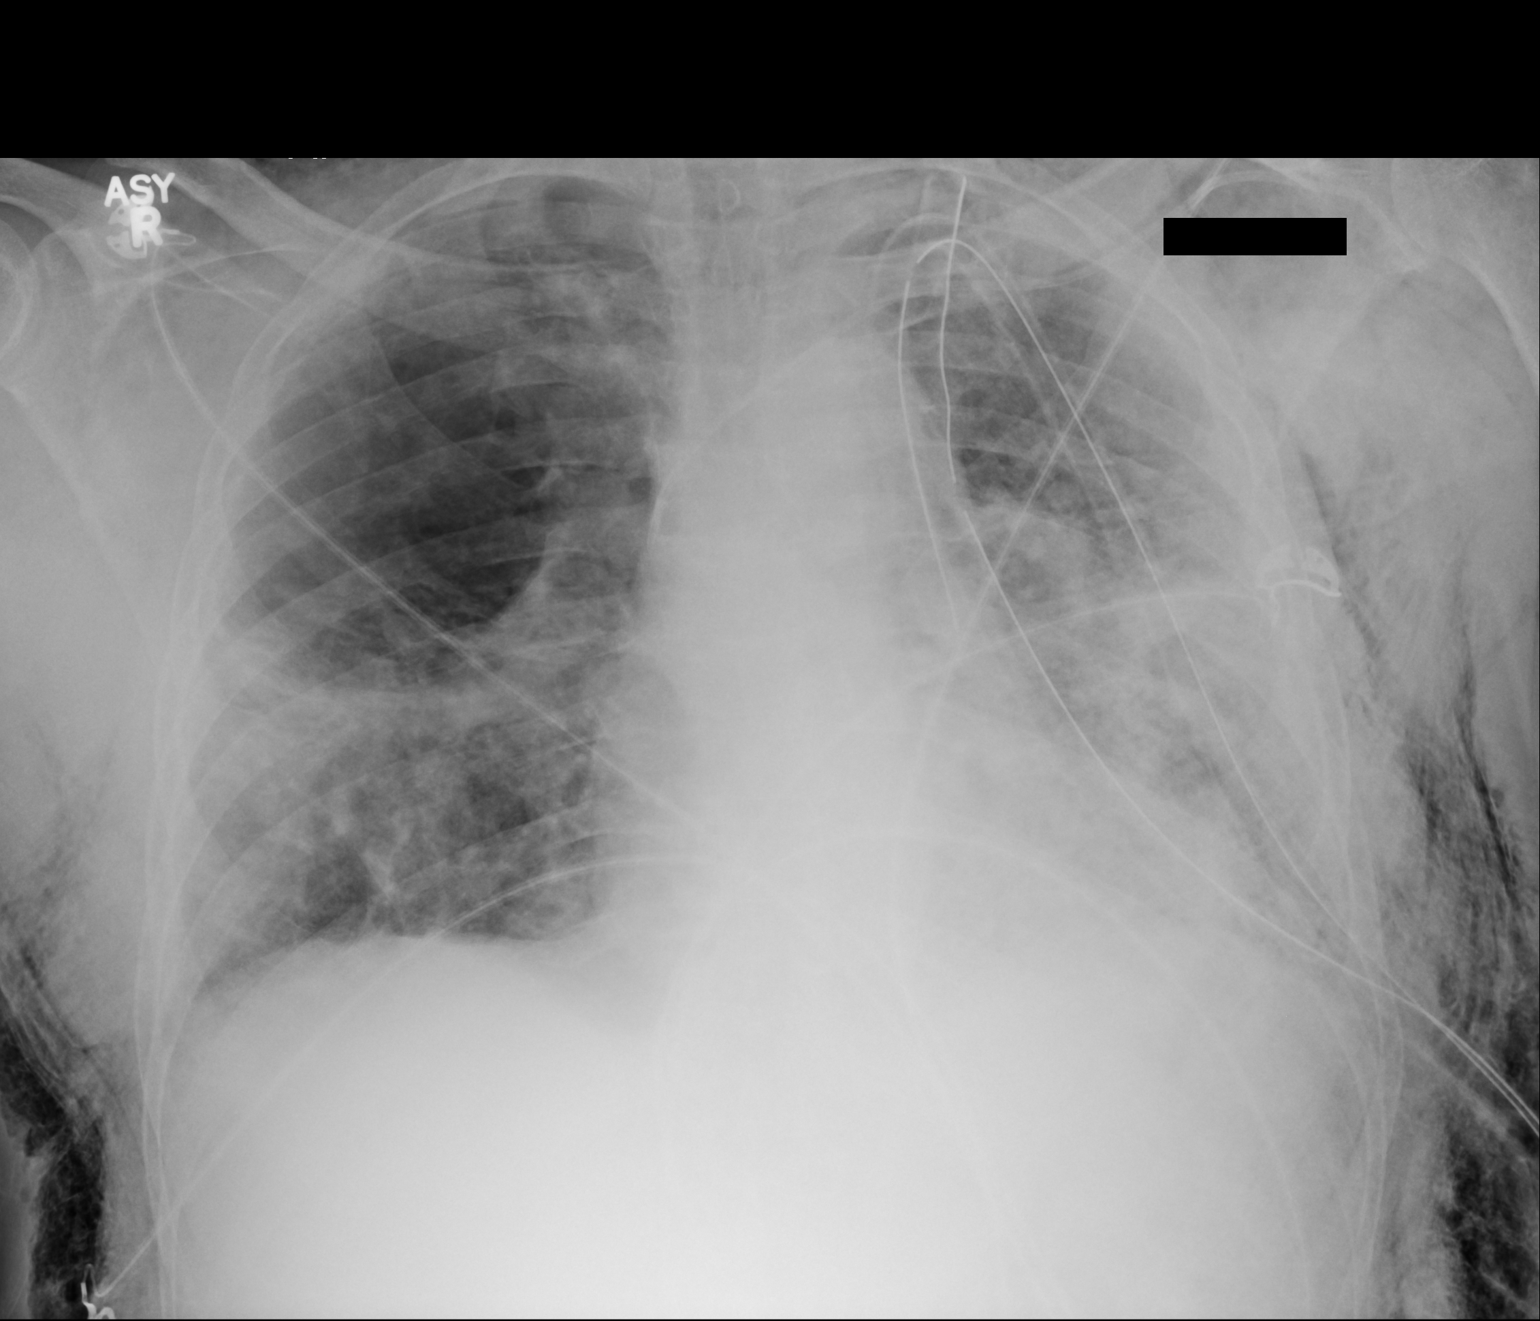

[1 of 1 positions shown; findings below may reference images not displayed]

FINDINGS: Stable position of left subclavian approach central
venous catheter with the tip in the mid superior vena cava.  To
apically directed left-sided thoracostomy tubes are in unchanged
position.  Stable extensive background pulmonary parenchymal
changes with large bulla in the right mid lung.  Slightly improved
but still diffuse subcutaneous emphysema.  No definite
pneumothorax.  No significant overall interval change.
IMPRESSION: 1. No significant interval change in the appearance of the lungs.
2.  Slight interval decrease in the diffuse subcutaneous emphysema.
3.  Stable and satisfactory support apparatus.

## 2013-10-01 IMAGING — CR DG CHEST 1V PORT
1 series · 1 of 1 positions shown · non-contrast
Comparison: 07/20/2012

CLINICAL DATA: Pneumothorax

PORTABLE CHEST - 1 VIEW

[AP]
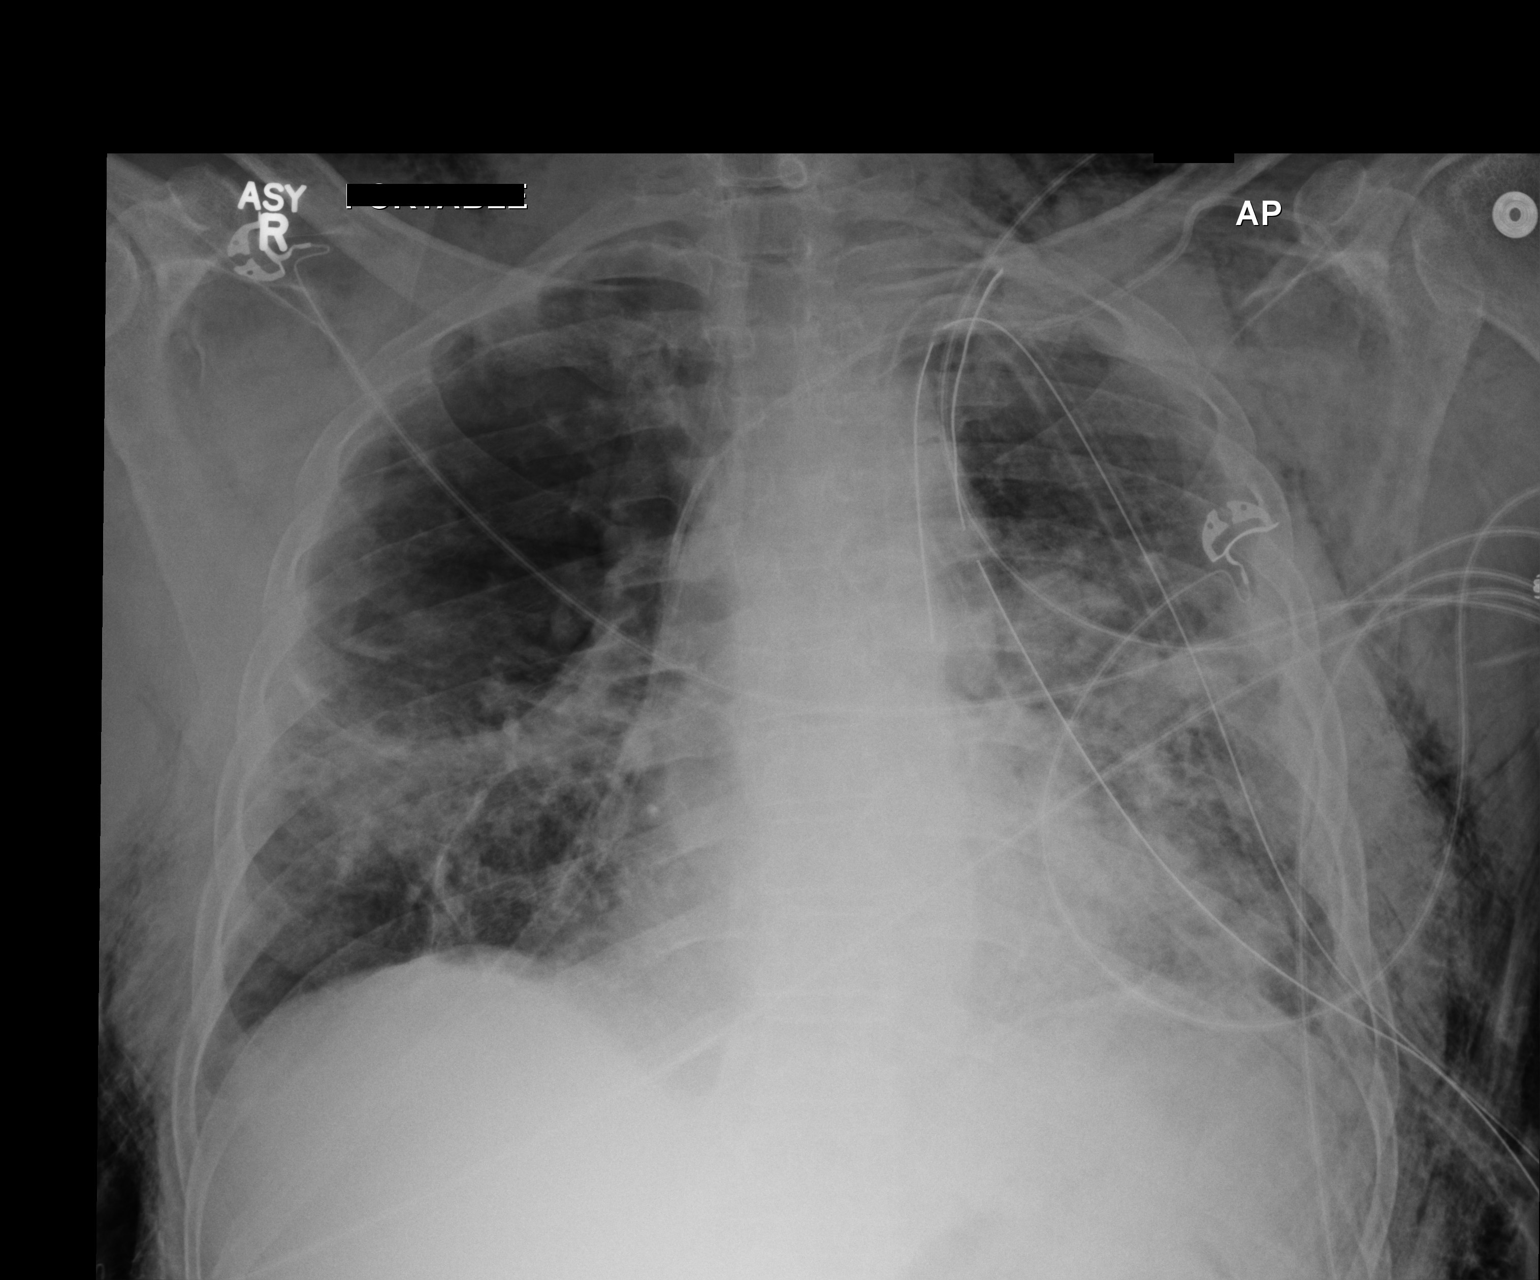

[1 of 1 positions shown; findings below may reference images not displayed]

FINDINGS: 7076 hours. The two left-sided chest tubes remain in
place.  No evidence for pneumothorax at this time.  The
subcutaneous emphysema persists.  Left subclavian central line tip
overlies the proximal SVC level.  There is bibasilar pulmonary
parenchymal opacity which is stable and may reflect asymmetric
edema or infection. The cardiopericardial silhouette is enlarged.
Telemetry leads overlie the chest.
IMPRESSION: Stable exam.  No new or progressive findings.

## 2013-10-02 IMAGING — CR DG CHEST 1V PORT
1 series · 1 of 1 positions shown · non-contrast
Comparison: 07/21/2012.

CLINICAL DATA: Removal anterior chest tube.  Sarcoidosis.

PORTABLE CHEST - 1 VIEW

[AP]
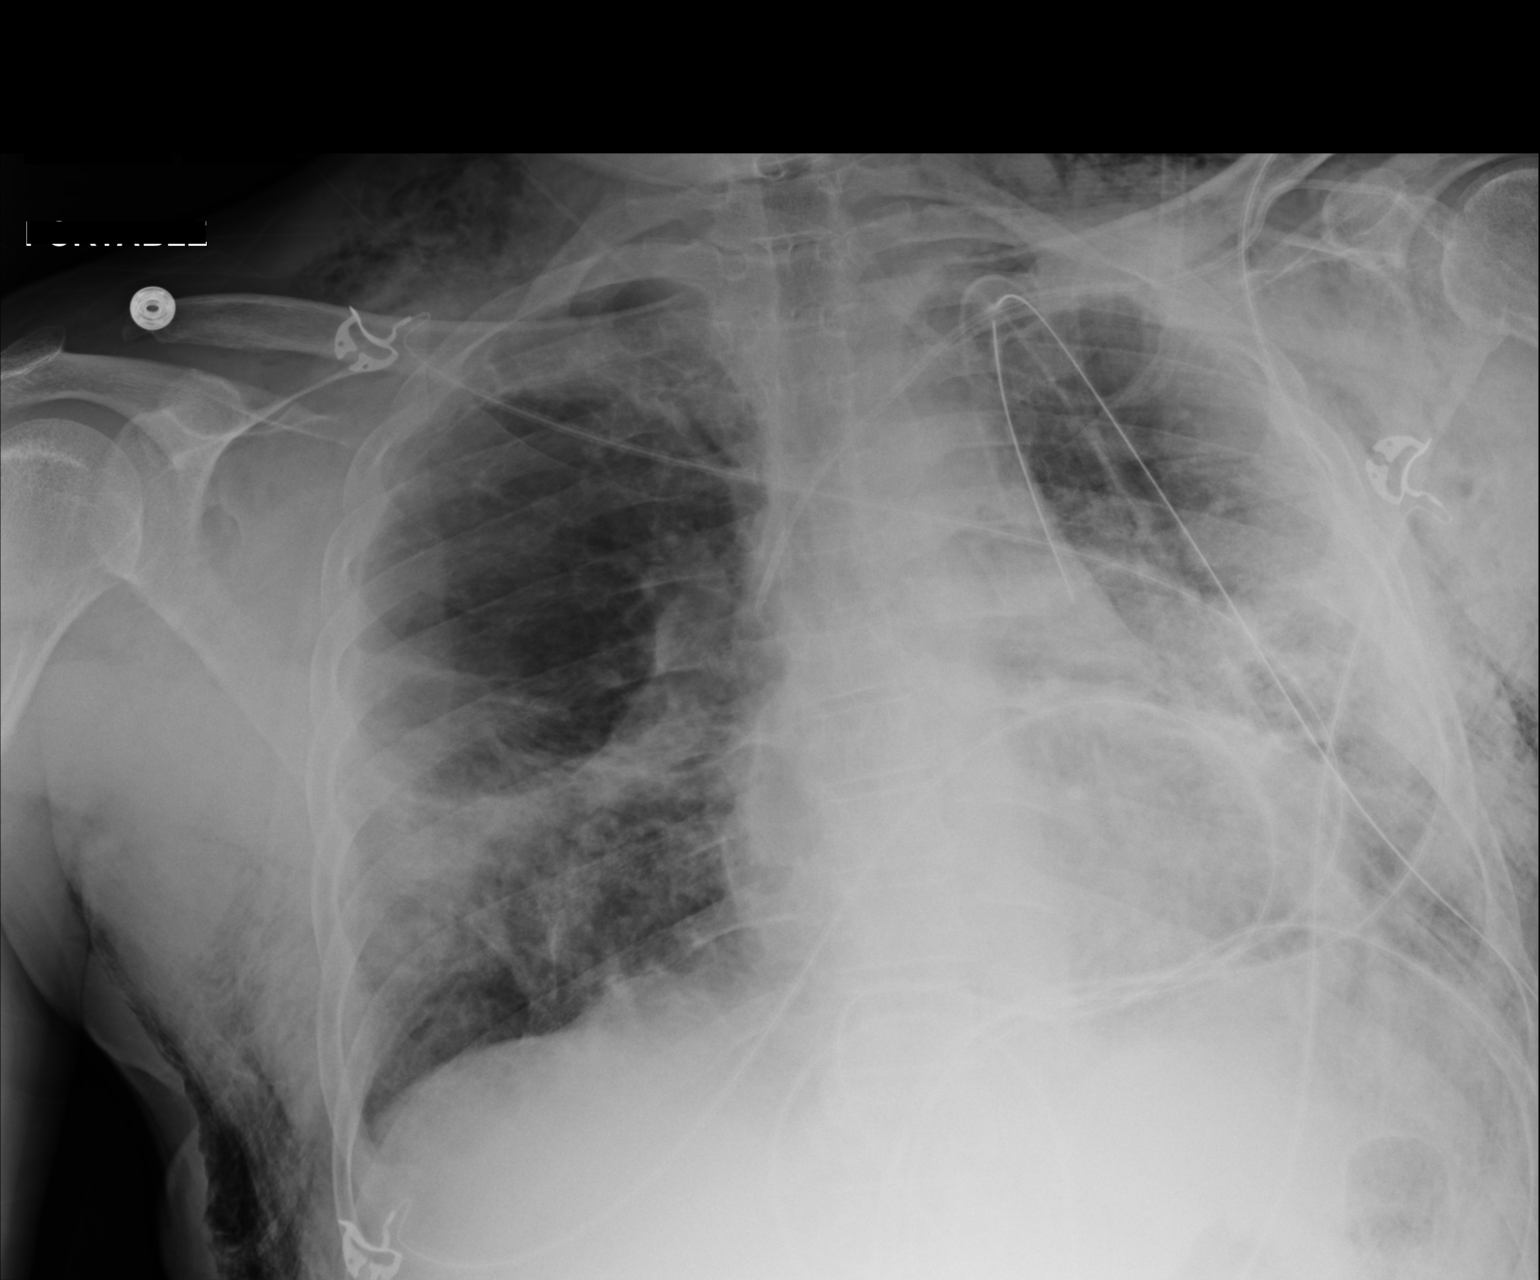

[1 of 1 positions shown; findings below may reference images not displayed]

FINDINGS: One of the two left-sided chest tubes has been removed.
The second tube remains in place.  There is no pneumothorax.
Bilateral airspace opacities are similar to the prior exam.  The
subcutaneous emphysema over the neck bilaterally is stable to
slightly increased.  The left subclavian catheter is stable.
IMPRESSION: 1.  No significant change in bilateral airspace opacities.
2.  Interval removal of a left-sided chest tube without significant
pneumothorax.
3.  Persistent to prominent bilateral subcutaneous emphysema.

## 2013-10-03 IMAGING — CR DG CHEST 1V PORT
1 series · 1 of 1 positions shown · non-contrast
Comparison: 07/22/2012

CLINICAL DATA: Sarcoidosis.  follow-up pneumothorax.

PORTABLE CHEST - 1 VIEW

[AP]
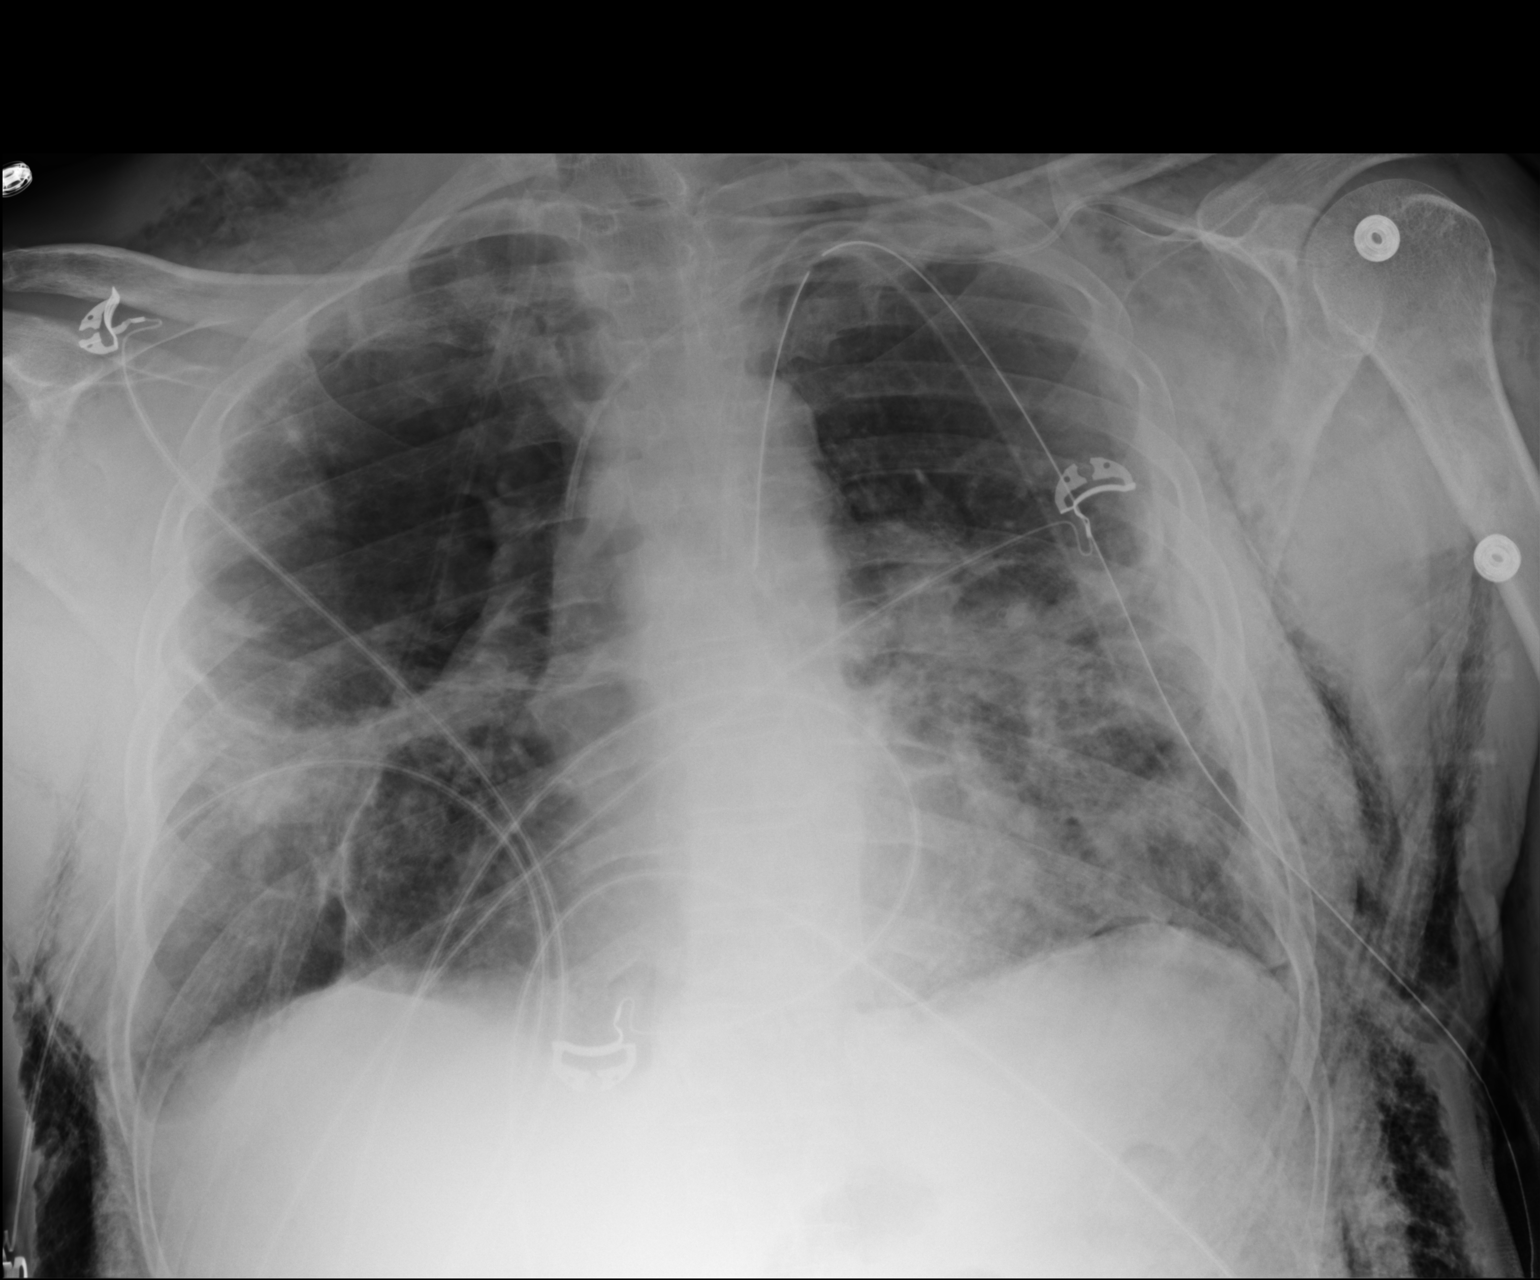

[1 of 1 positions shown; findings below may reference images not displayed]

FINDINGS: The left subclavian center venous catheter remains in
appropriate position.  Left chest tube remains in place.  A tiny
left basilar pneumothorax is seen, with extensive subcutaneous
emphysema, left side greater than right.

A small approximately 10% right apical pneumothorax is also seen,
but without significant change.  Bilateral lower lung infiltrates
show no significant change.  Heart size is normal.
IMPRESSION: 1. Stable small approximately 10% right apical pneumothorax.
2.  Tiny left basilar pneumothorax with left chest tube in place.
3.  Stable bilateral lower lung infiltrates.

## 2013-10-05 IMAGING — CR DG CHEST 1V PORT
1 series · 1 of 1 positions shown · non-contrast
Comparison: 07/23/2012 and earlier.

CLINICAL DATA: 46-year-old male with left pneumothorax.
Sarcoidosis.

PORTABLE CHEST - 1 VIEW

[AP]
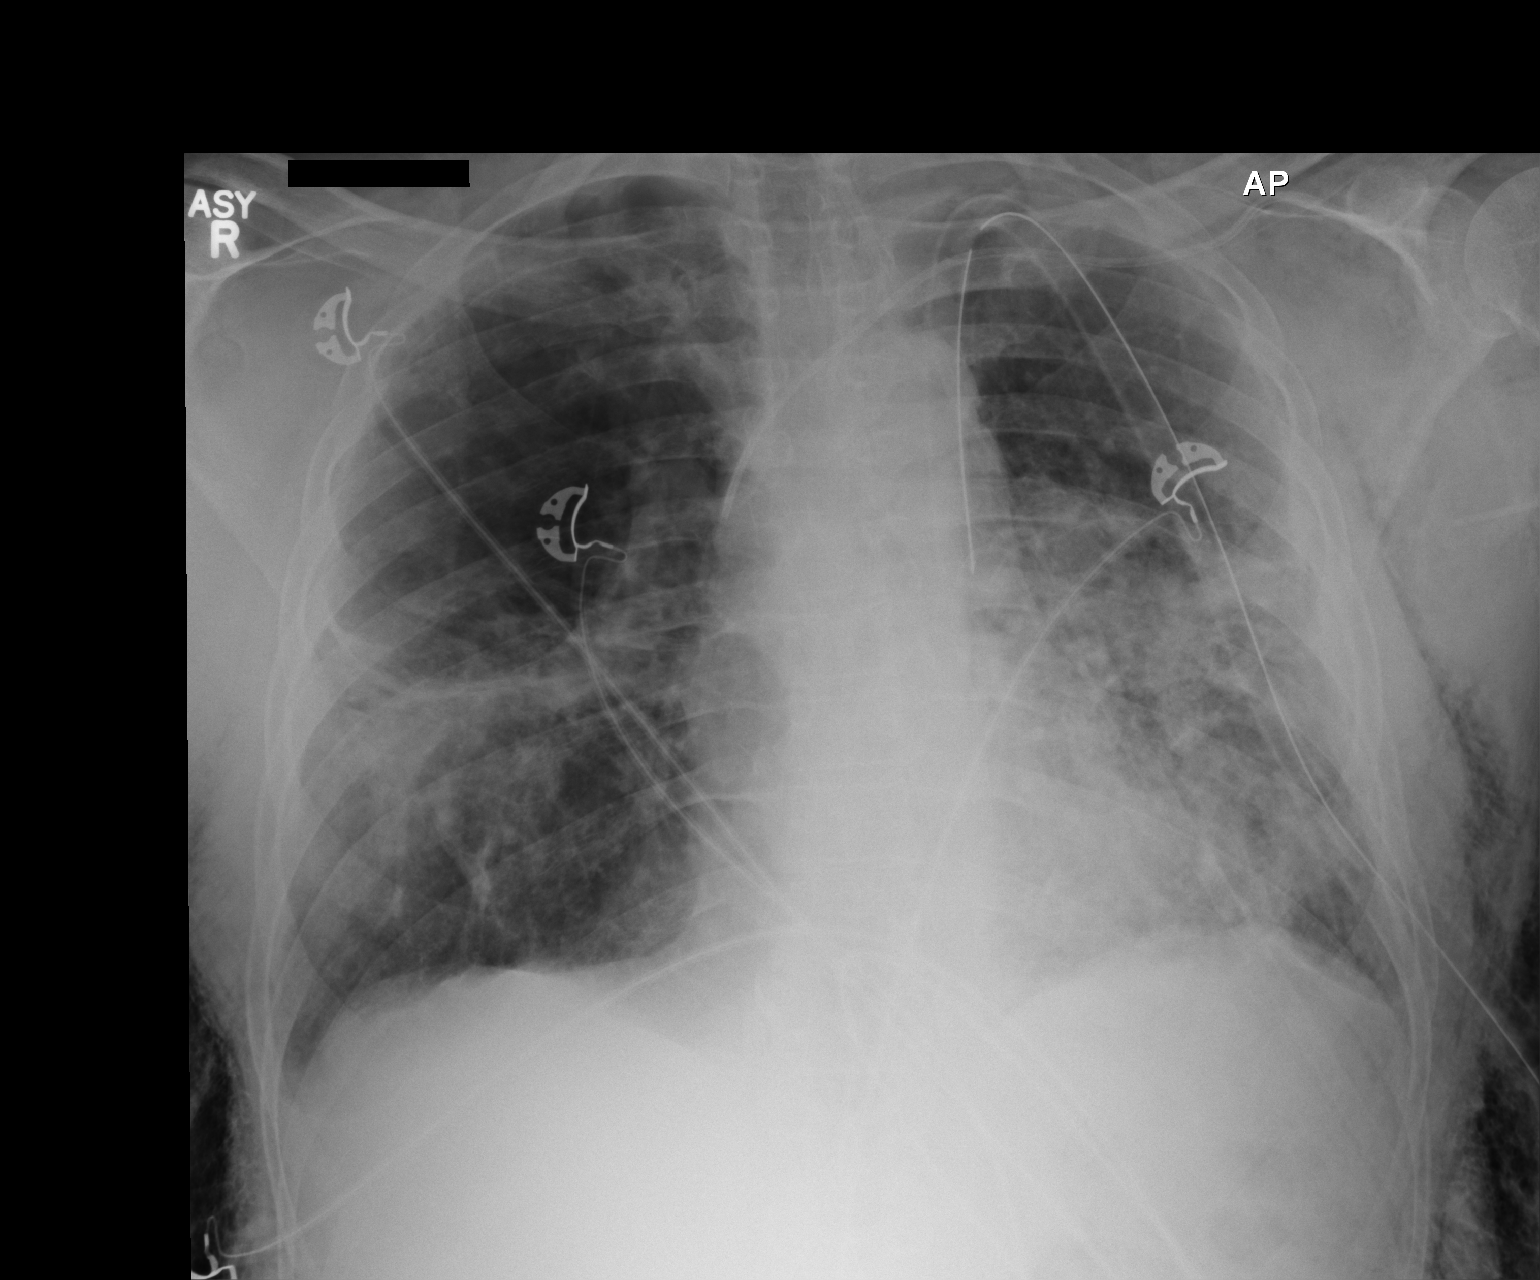

[1 of 1 positions shown; findings below may reference images not displayed]

FINDINGS: Portable semi upright AP view 1889 hours.  Stable left
chest tube.  Mildly decreased left chest wall and neck subcutaneous
gas.  No residual left pneumothorax is identified today.  Patchy
and nodular opacity about the hila and at the left lung base is not
significantly changed.

 Stable cardiac size and mediastinal contours.  Stable left
subclavian approach central line.

Stable right lung.  Large right upper lobe bolus/cavitary lesion
again noted favor that the superior aspect of that lesion is
simulating a right pneumothorax.

No large effusions.
IMPRESSION: 1.  Stable left chest tube.  No residual left pneumothorax evident.
Decreased subcutaneous gas.
2.  Overall stable ventilation.
3.  Favor a large chronic bolus/cavitary lesion in the right upper
lobe is intermittently simulating a small pneumothorax on in that
lung. (See chest CTA 04/12/2011).

## 2013-10-06 IMAGING — CR DG CHEST 1V PORT
1 series · 1 of 1 positions shown · non-contrast
Comparison: One-view chest 07/26/2012 at [DATE] a.m..

CLINICAL DATA: Status post chest tube removal.

PORTABLE CHEST - 1 VIEW

[AP]
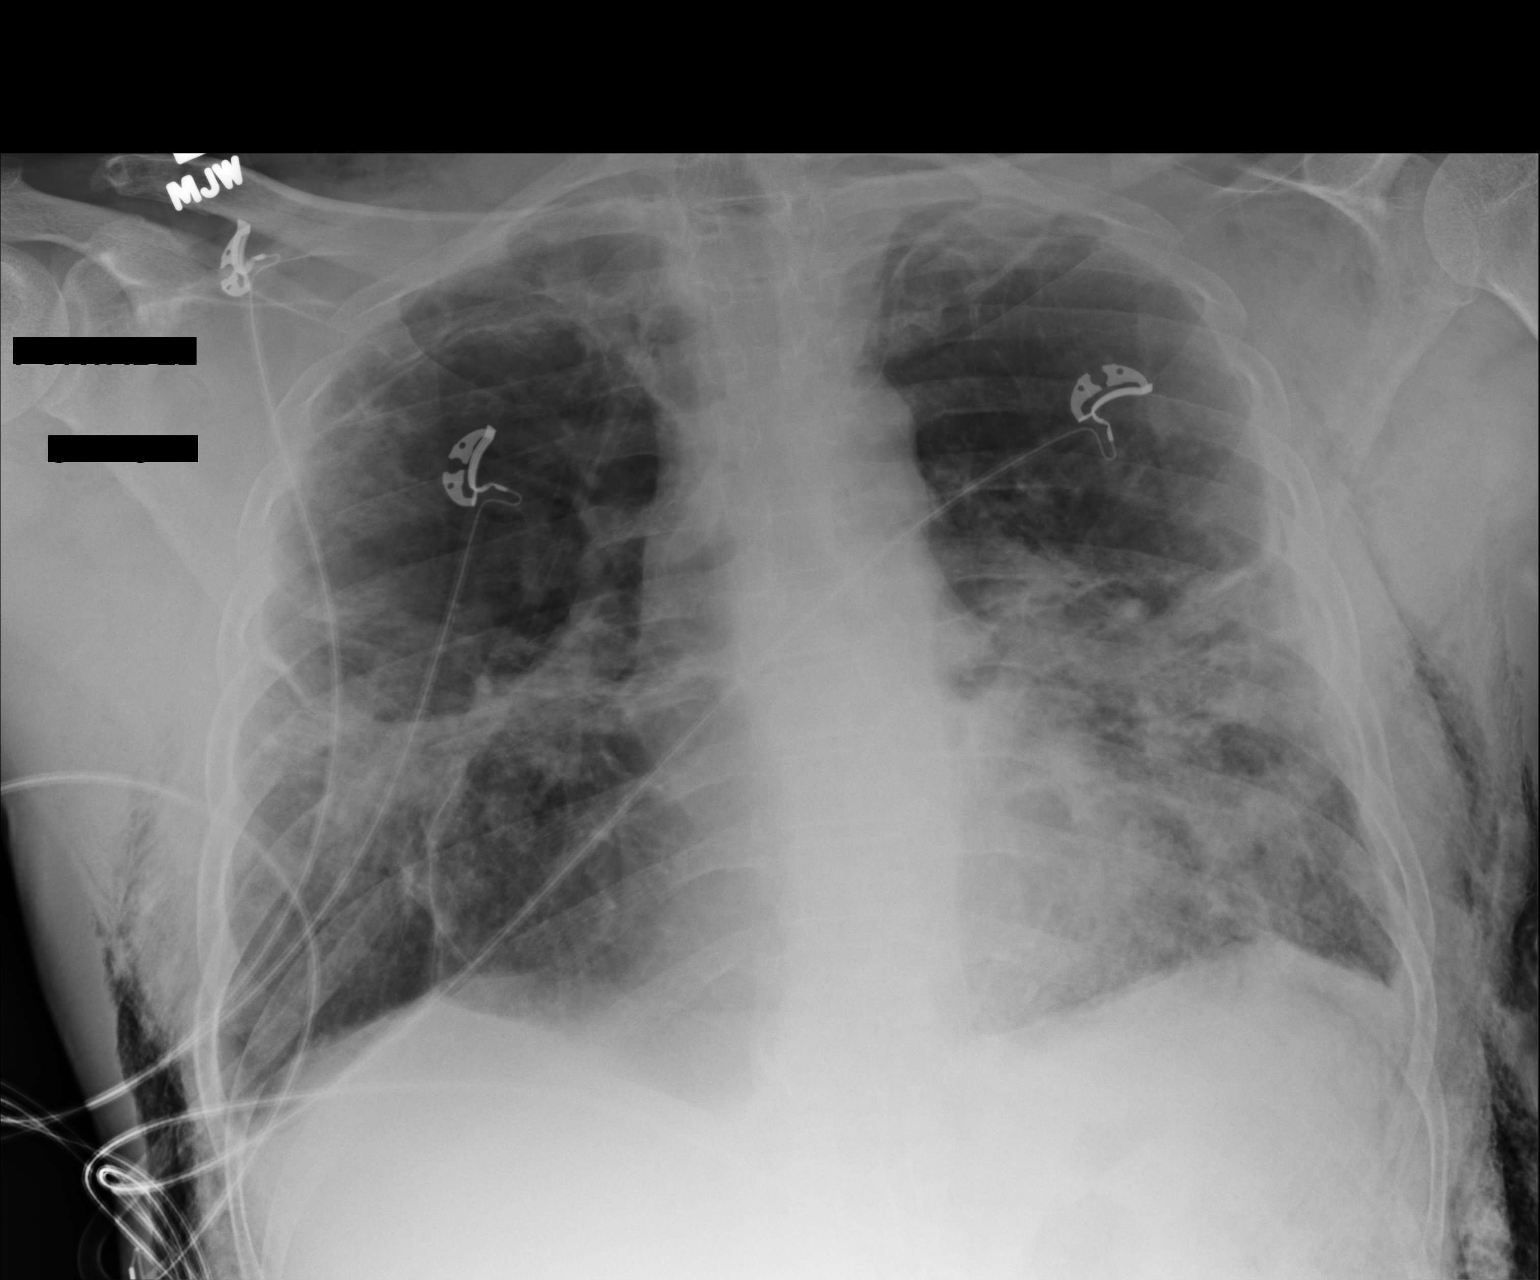

[1 of 1 positions shown; findings below may reference images not displayed]

FINDINGS: The left-sided chest tube has been removed.  Scarring is
present at the left lung apex without definite pleural air.
Bilateral fibrosis is otherwise unchanged.  Subcutaneous air
bilaterally is similar to the prior exam.
IMPRESSION: 1.  Interval removal of the left-sided chest tube without
significant left pleural air.
2.  Similar appearance of bilateral pleural scarring and fibrosis.
3.  Persistent bilateral subcutaneous emphysema.

## 2013-10-07 IMAGING — CR DG CHEST 2V
2 series · 2 of 2 positions shown · non-contrast
Comparison: Single view of the chest 07/26/2012 and07/25/2012. CT
chest 06/27/2012.

CLINICAL DATA: Status post VATS.  Shortness of breath.

CHEST - 2 VIEW

[w chest pa]
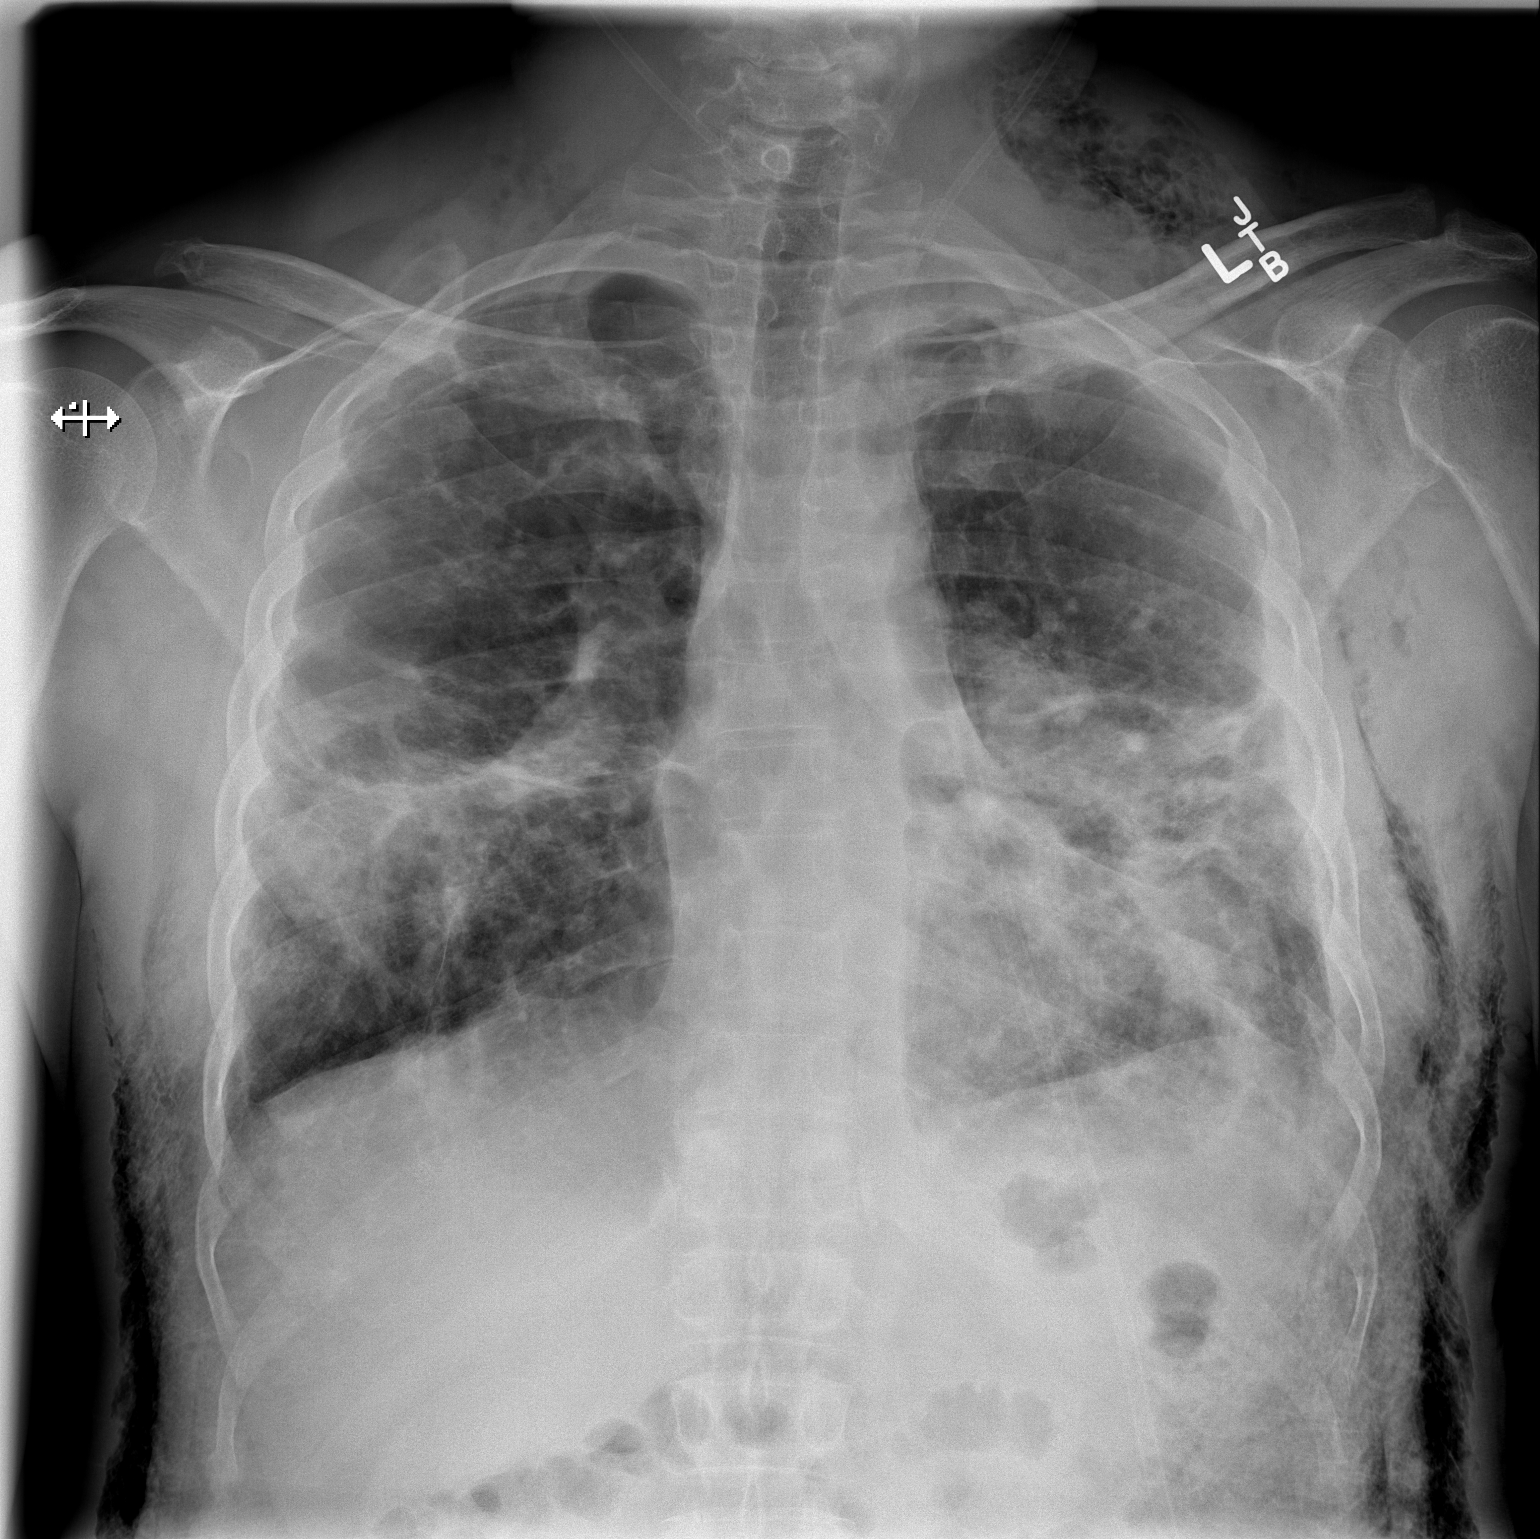

[w chest lat]
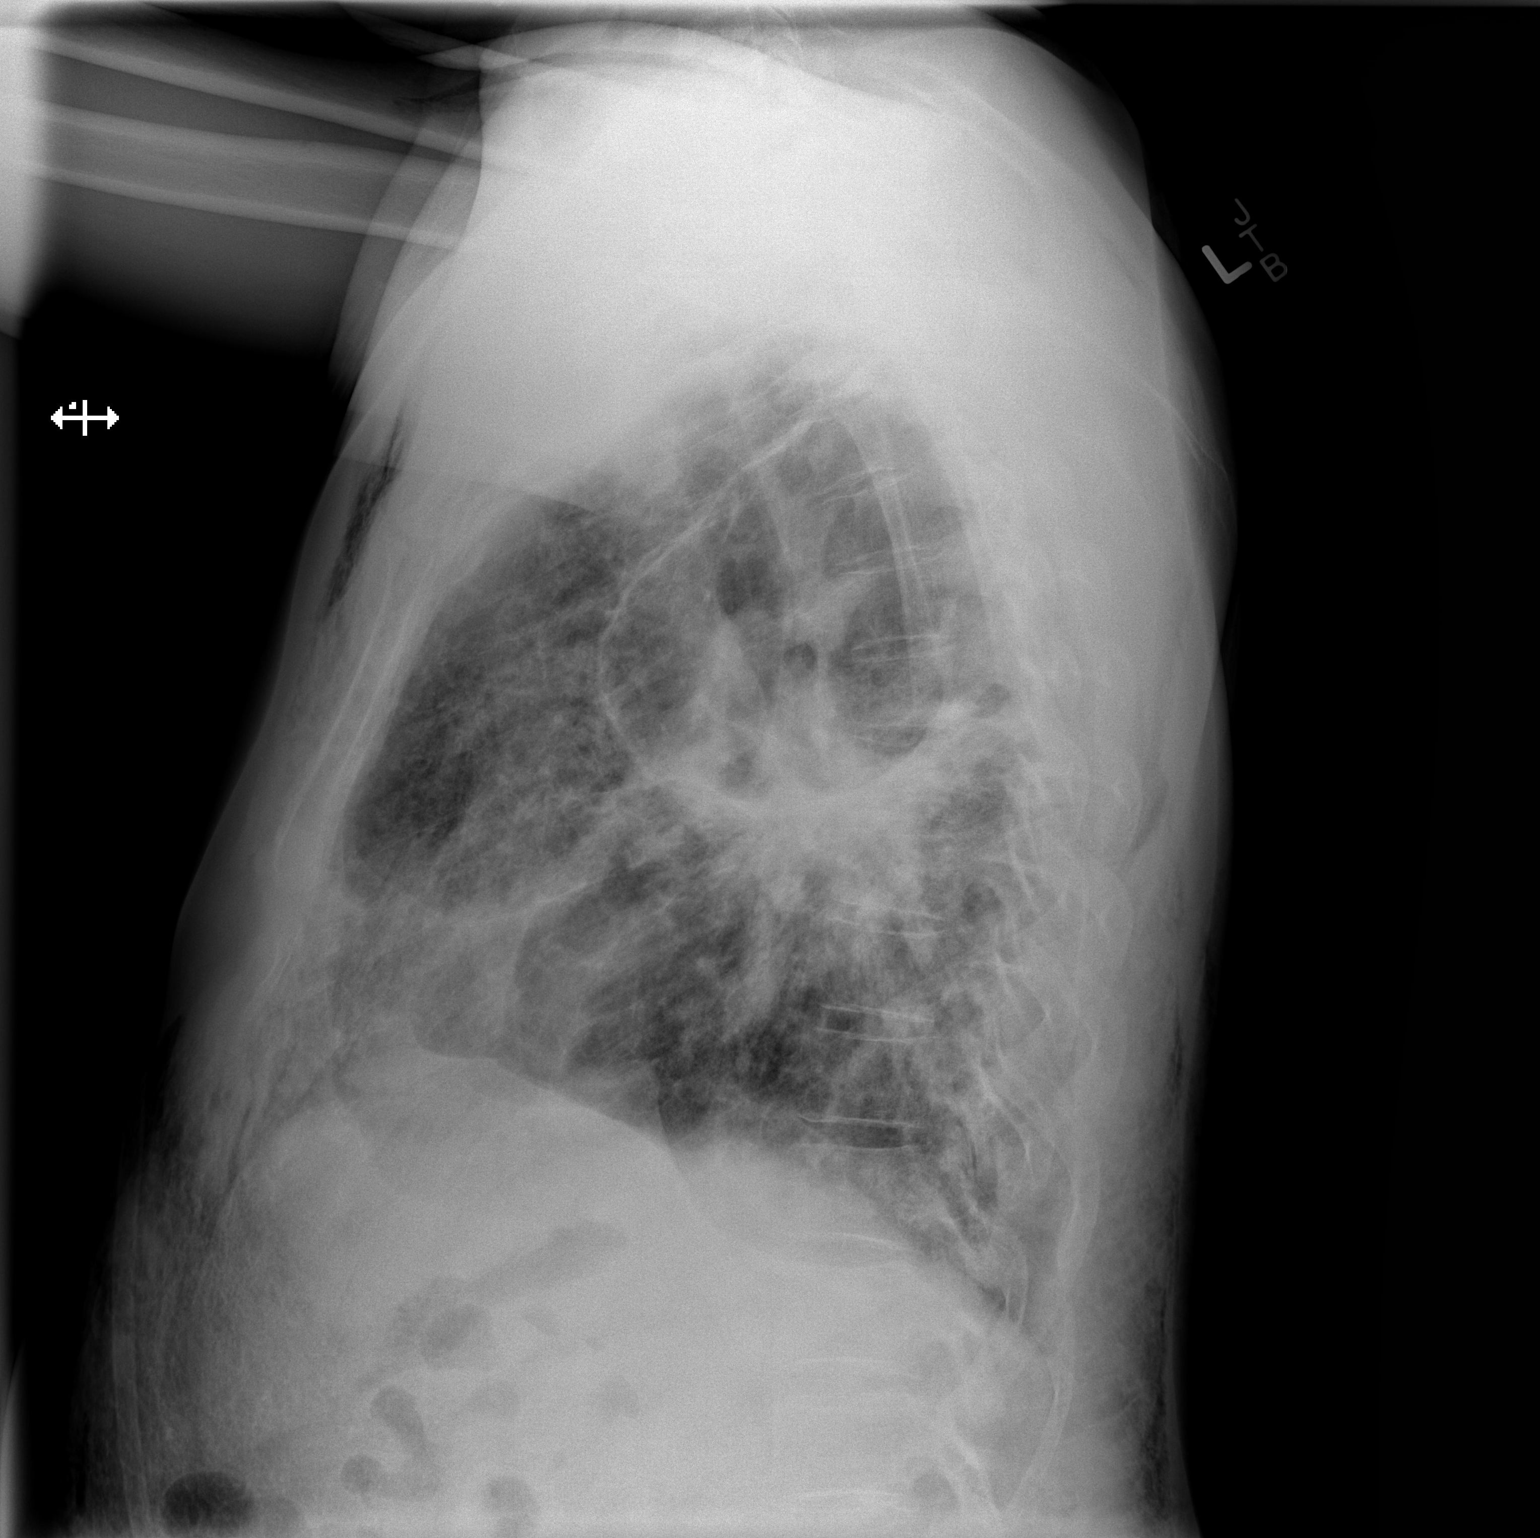

[2 of 2 positions shown; findings below may reference images not displayed]

FINDINGS: Extensive subcutaneous air about the chest, worse on the
left, persists. Pleural and parenchymal opacities in the mid and
lower lung zones, worse on the left, do not appear changed over the
past 2 days.  Bullous lesion in the right apex is again noted.
Heart size is normal.
IMPRESSION: 1.  Negative for pneumothorax.  Extensive subcutaneous air about
the chest persists.
2.  No change in left worse than right pleural and parenchymal
opacities.

## 2013-10-08 IMAGING — CR DG CHEST 1V PORT
1 series · 1 of 1 positions shown · non-contrast
Comparison: 07/28/2012

CLINICAL DATA: Shortness of breath and abdominal pain.

PORTABLE CHEST - 1 VIEW

[AP]
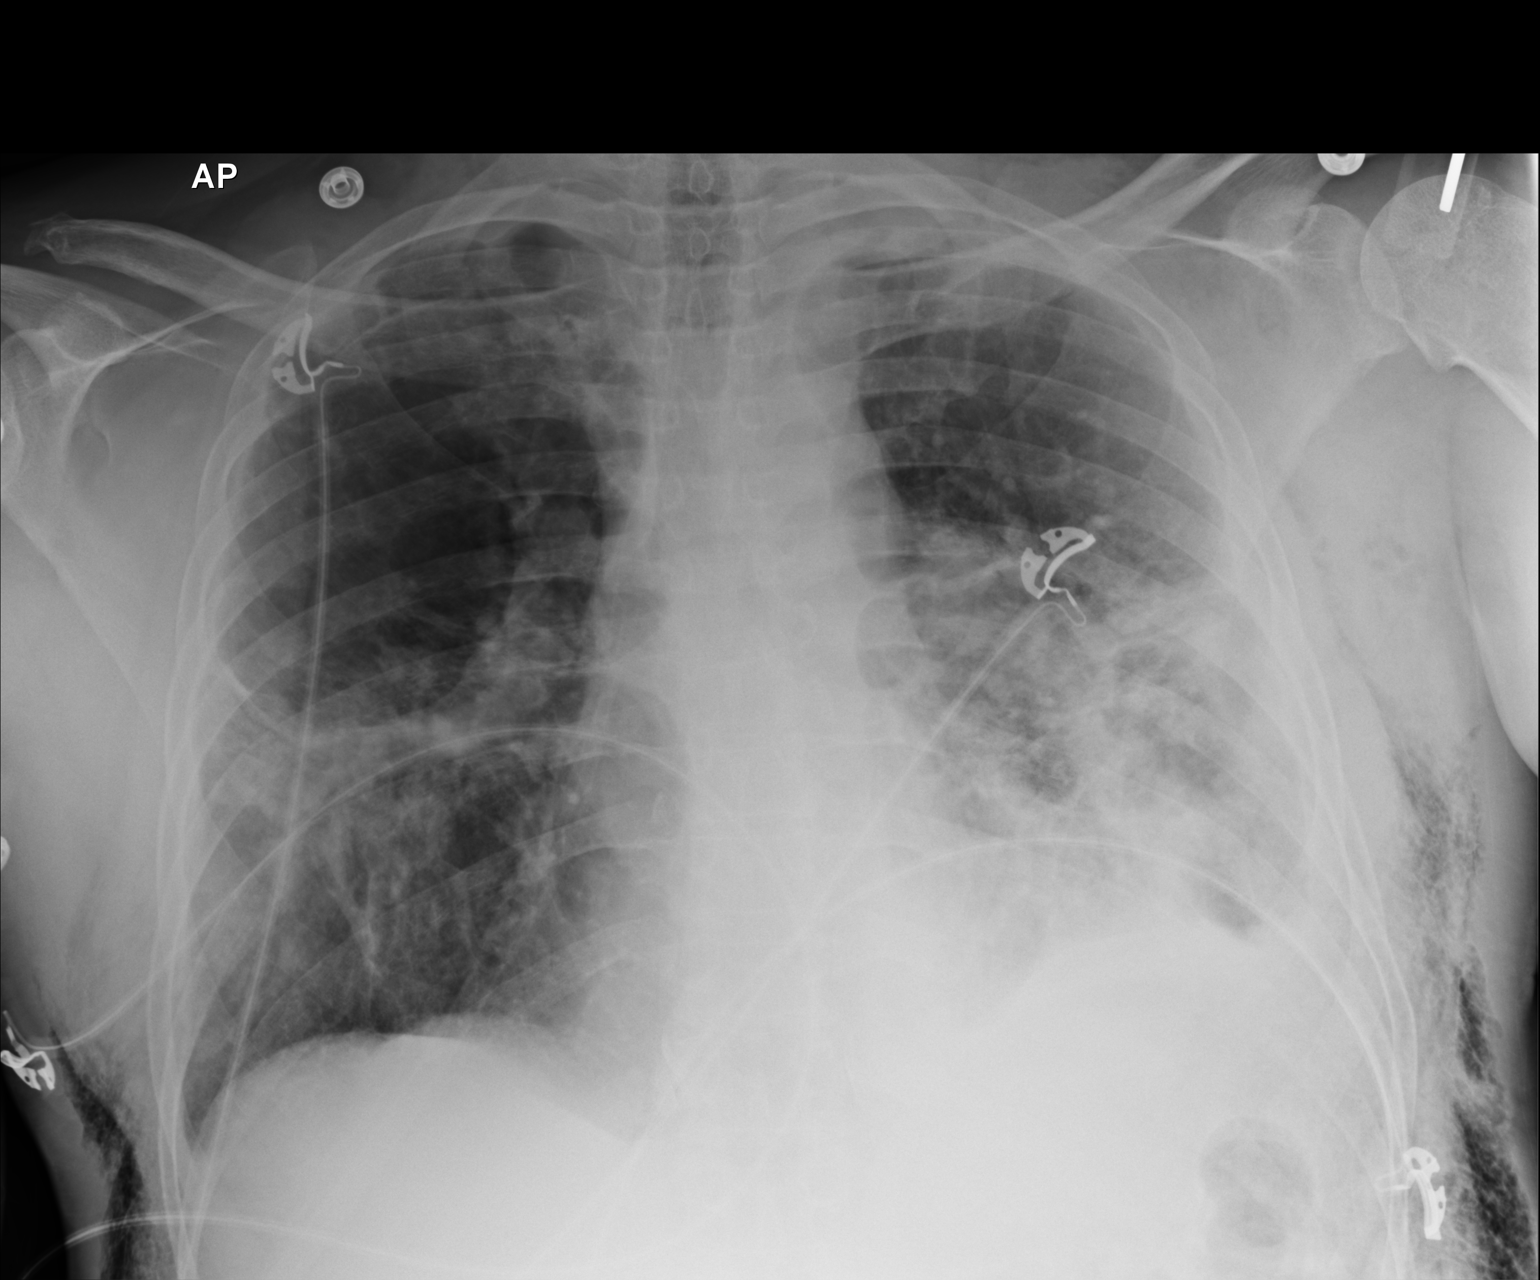

[1 of 1 positions shown; findings below may reference images not displayed]

FINDINGS: 1311 hours.  The bibasilar interstitial and patchy
airspace disease persists, without substantial interval change
given the differential lung volumes.  Cardiopericardial silhouette
is enlarged is stable.  The subcutaneous emphysema persists.
Telemetry leads overlie the chest.
IMPRESSION: Increased lung volumes without substantial interval change exam.

## 2013-10-08 IMAGING — CR DG CHEST 1V PORT
1 series · 1 of 1 positions shown · non-contrast
Comparison: Chest radiograph performed 07/27/2012

CLINICAL DATA: Left-sided chest and flank pain.  Recent
pneumothorax.

PORTABLE CHEST - 1 VIEW

[AP]
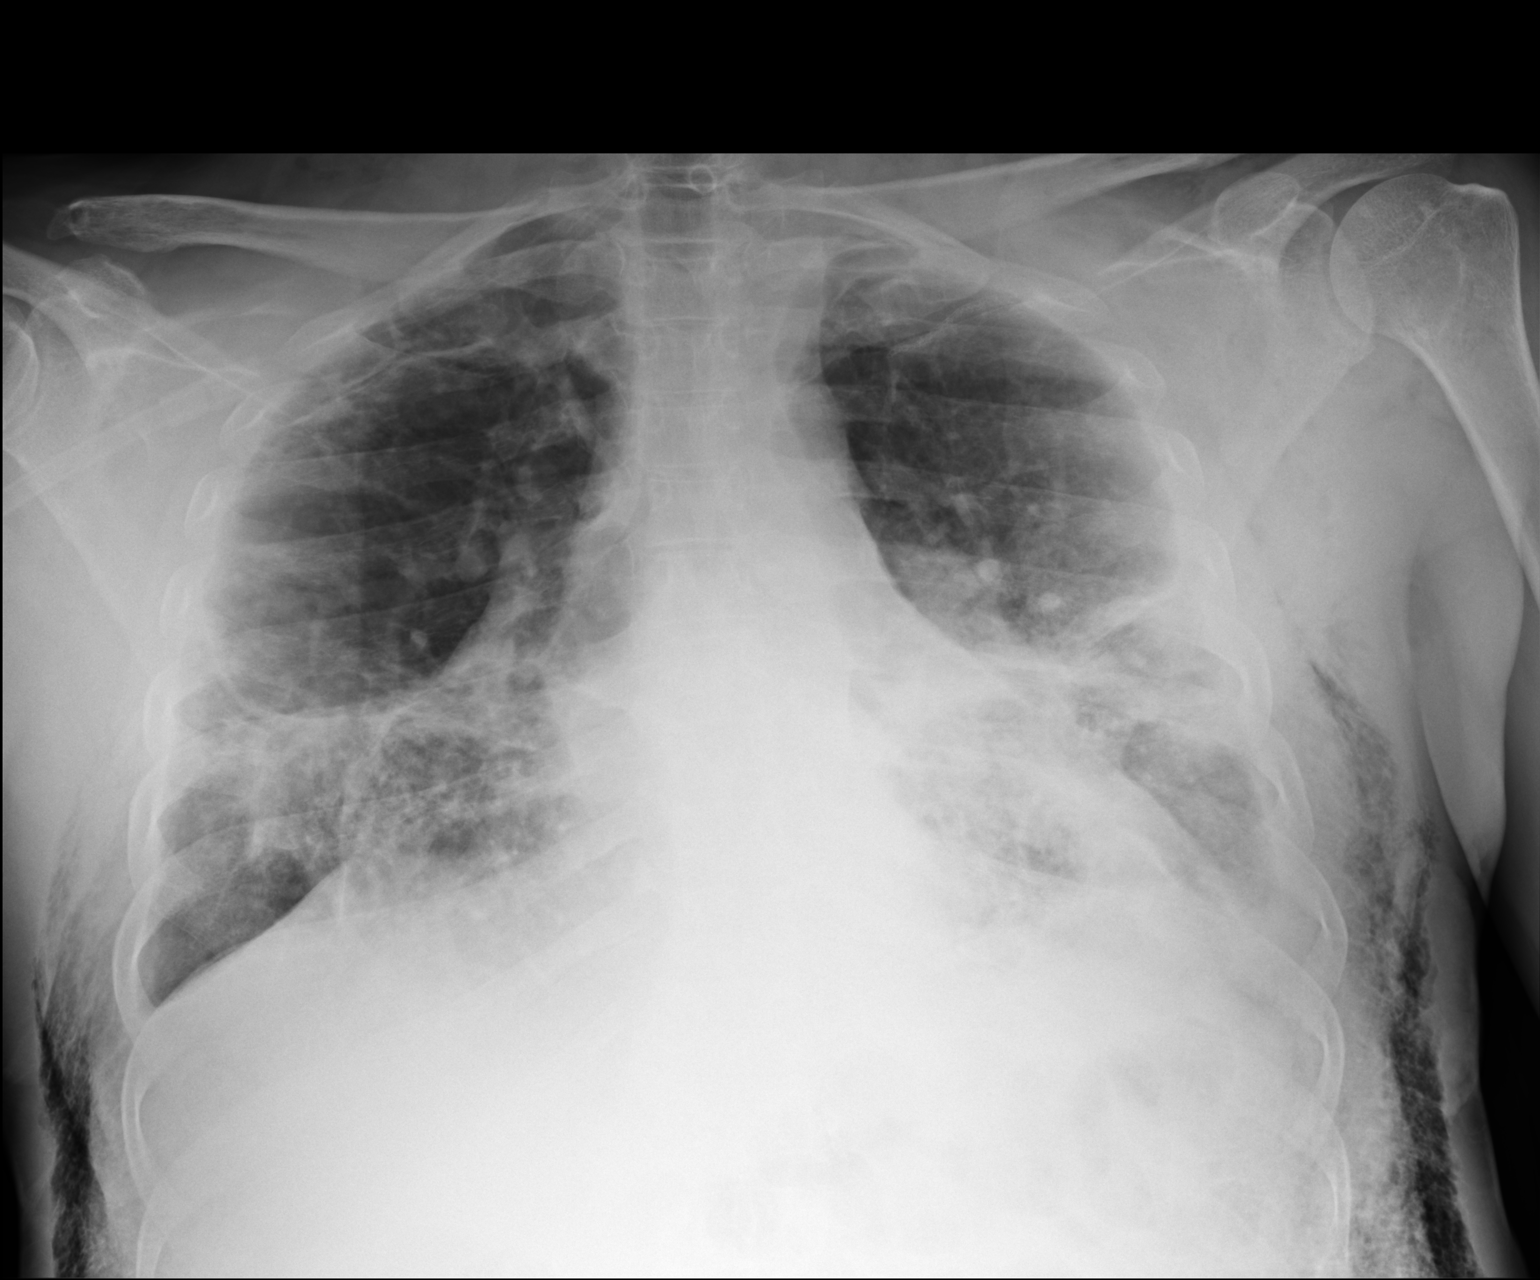

[1 of 1 positions shown; findings below may reference images not displayed]

FINDINGS: The lungs are hypoexpanded.  No pneumothorax is seen.
Pleural and parenchymal airspace opacities at the mid and lower
lung zones, worse on the left, appear grossly stable, given lung
hypoexpansion. These reflect the patient's known sarcoidosis.  A
large bulla is again noted at the right midlung zone.  No definite
pleural effusion or pneumothorax is seen; stable left-sided pleural
thickening is again noted.

The cardiomediastinal silhouette remains normal in size.  No acute
osseous abnormalities are seen.  Soft tissue air along both sides
of the chest wall and at the supraclavicular regions is grossly
stable in appearance.
IMPRESSION: 1.  Lungs hypoexpanded; underlying chronic pleural and parenchymal
airspace opacities are grossly stable, reflecting known
sarcoidosis.  Large bulla again noted at the right midlung zone.
No acute superimposed airspace consolidation seen.
2.  No evidence of pneumothorax.
3.  Soft tissue air along both sides of the chest wall and at the
supraclavicular regions is grossly stable in appearance.

## 2013-10-09 IMAGING — CR DG CHEST 1V PORT
1 series · 1 of 1 positions shown · non-contrast
Comparison: 07/28/2012

CLINICAL DATA: Dyspnea.  History of sarcoid.

PORTABLE CHEST - 1 VIEW

[AP]
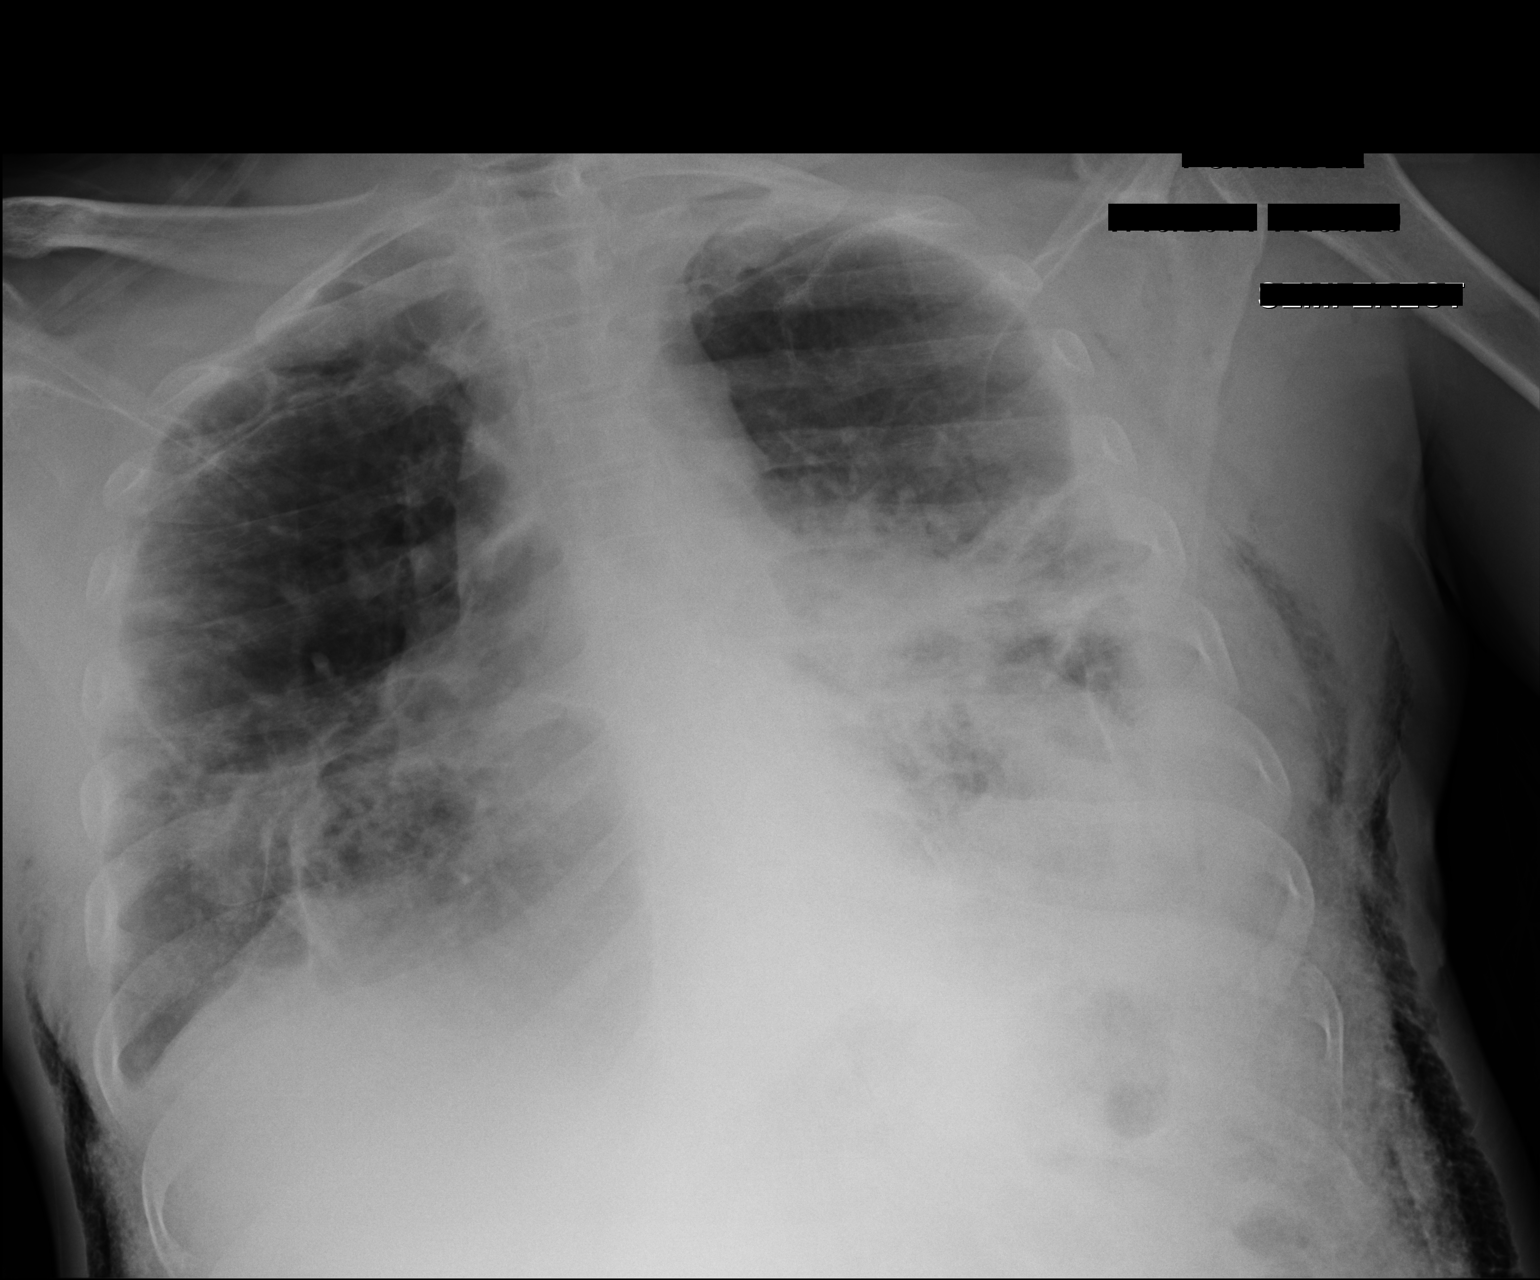

[1 of 1 positions shown; findings below may reference images not displayed]

FINDINGS: The heart size is normal.

There are decreased lung volumes noted.

Extensive bilateral interstitial and airspace opacities are again
noted and appear unchanged from previous exam.  This predominately
affects the mid lungs and lower lung zones.

Subcutaneous emphysema within the chest wall is unchanged from
previous exam.
IMPRESSION: 1.  No change in the bilateral interstitial and airspace densities.

## 2013-10-09 IMAGING — CR DG CHEST 1V PORT
1 series · 1 of 1 positions shown · non-contrast
Comparison: 07/29/2012 at [DATE] a.m.

CLINICAL DATA: Evaluate for pneumothorax

PORTABLE CHEST - 1 VIEW

[AP]
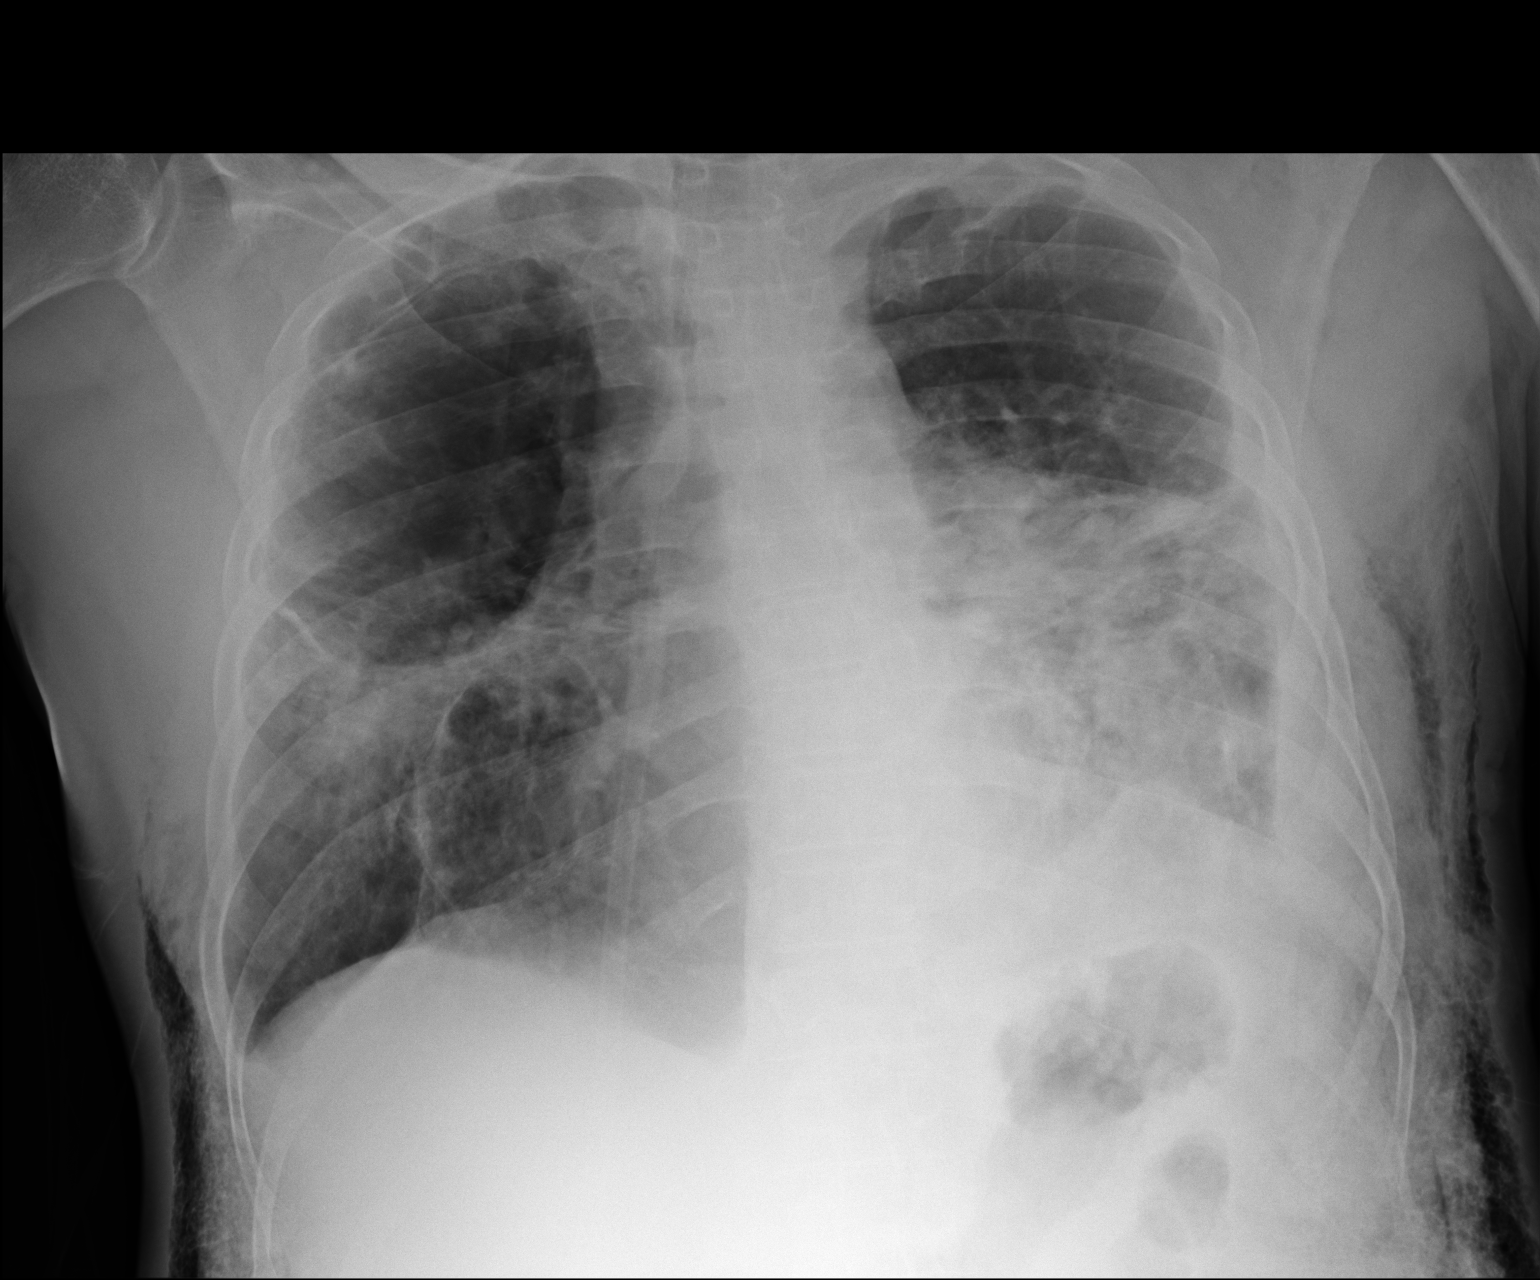

[1 of 1 positions shown; findings below may reference images not displayed]

FINDINGS: No pneumothorax is seen.

Bilateral mid and lower lung zone coarse reticular and patchy
airspace opacities are stable.  There is a probable small loculated
left pleural effusion.  Subcutaneous air is unchanged.
IMPRESSION: No pneumothorax is seen.  No change from the earlier study.

## 2013-10-10 IMAGING — CT CT CHEST W/ CM
2 of 4 series · 15 of 36 positions shown, 18 images · IV contrast (APPLIED)
Comparison: Multiple prior plain films of the chest, last
07/29/2012.  CT chest 06/27/2012 and 02/14/1948.

CT CHEST

***ADDENDUM*** CREATED: 08/02/2012 [DATE]

This addendum is given for the purpose of correcting the Impression
in the initially dictated report pertaining to the abdomen and
pelvis. The Impression should read as follows:
Diffuse subcutaneous emphysema about the abdomen. The abdomen is
otherwise negative.
***END ADDENDUM*** SIGNED BY: Olimpia Tiger, M.D.
CLINICAL DATA: Respiratory failure.  History of sarcoidosis.
Status post left chest tube removal 07/26/2012.  Worsening lower
chest and epigastric pain.
CT CHEST, ABDOMEN AND PELVIS WITH CONTRAST
TECHNIQUE: Multidetector CT imaging of the chest, abdomen and
pelvis was performed following the standard protocol during bolus
administration of intravenous contrast.
Contrast: 100mL OMNIPAQUE IOHEXOL 300 MG/ML  SOLN

[Series 2: cap 5.0 i31f 1 · axial · 0.83mm/px · z∈[-552,-177]mm · 12 of 85 slices shown, 15 images]
[im 5/85  mediastinal]
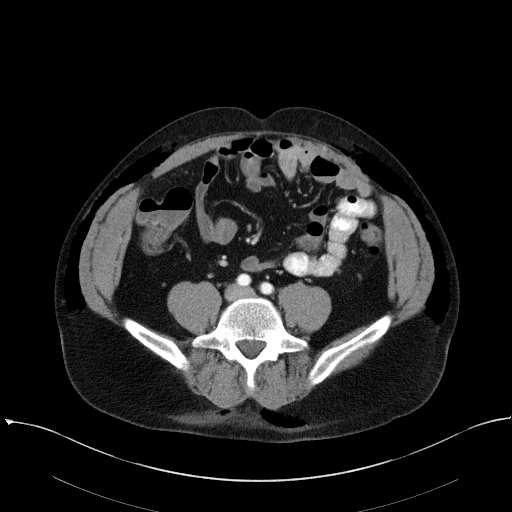
[im 5/85  lung]
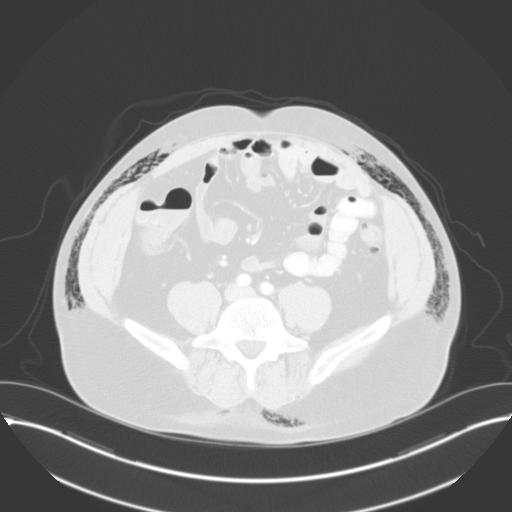
[im 13/85  lung]
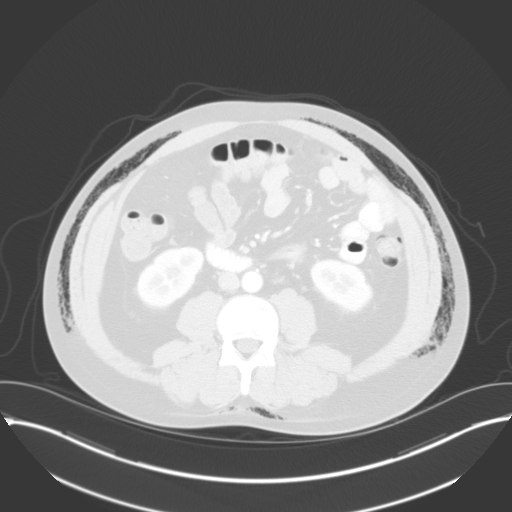
[im 17/85  lung]
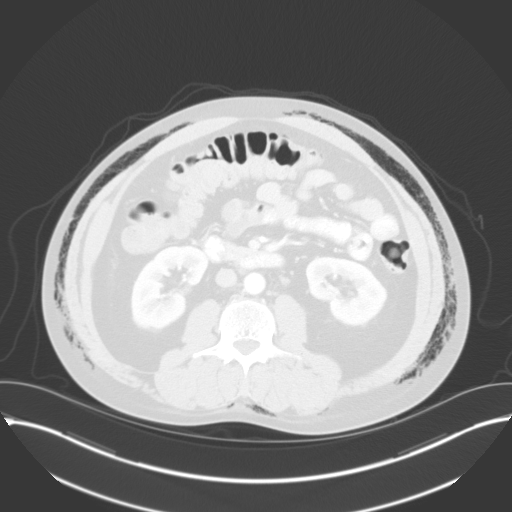
[im 26/85  lung]
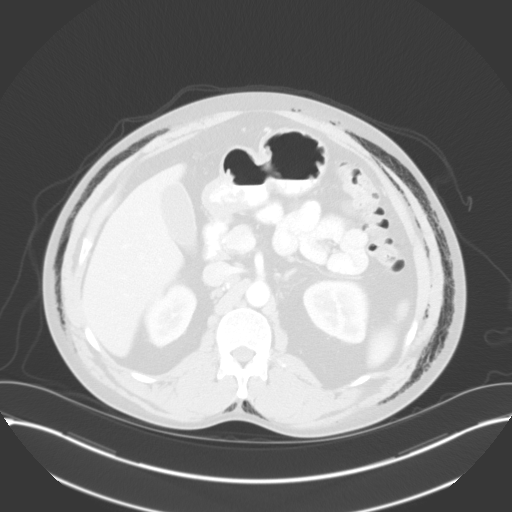
[im 34/85  mediastinal]
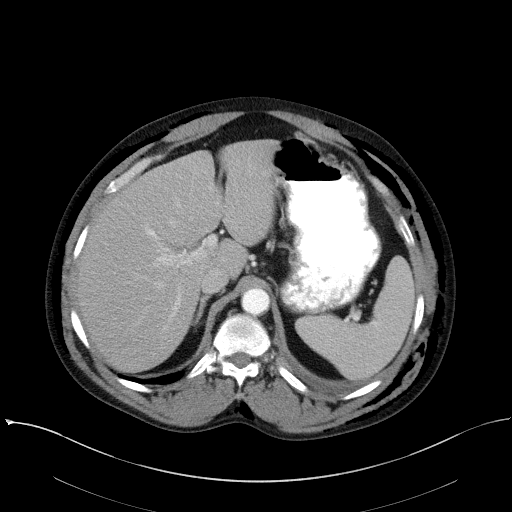
[im 34/85  lung]
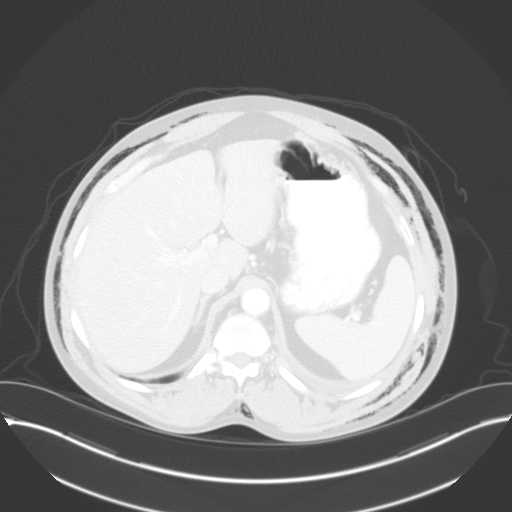
[im 38/85  lung]
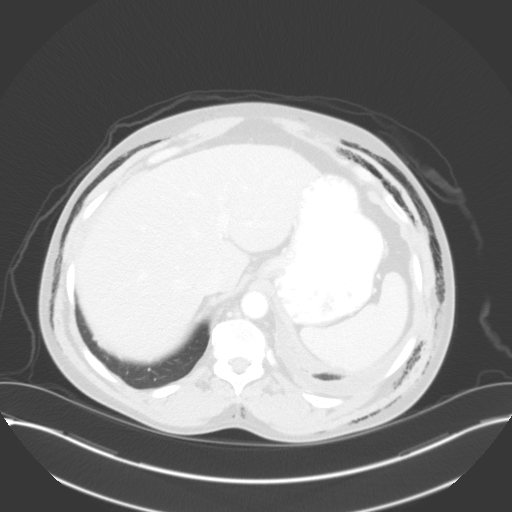
[im 47/85  lung]
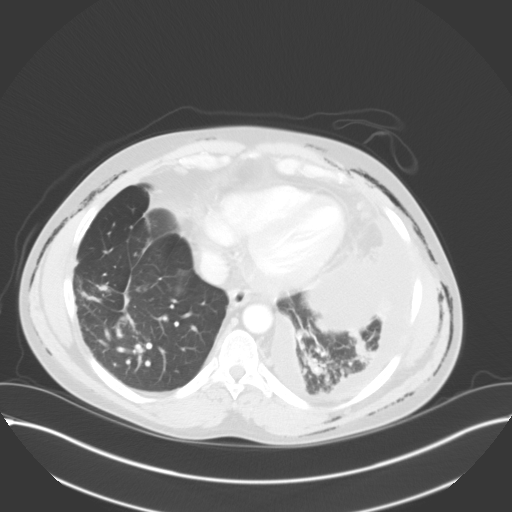
[im 51/85  lung]
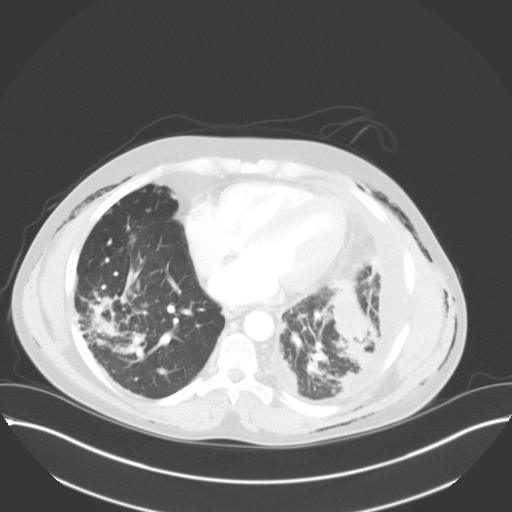
[im 59/85  mediastinal]
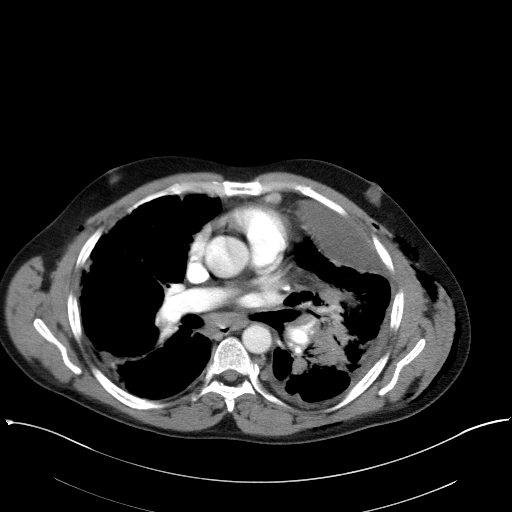
[im 59/85  lung]
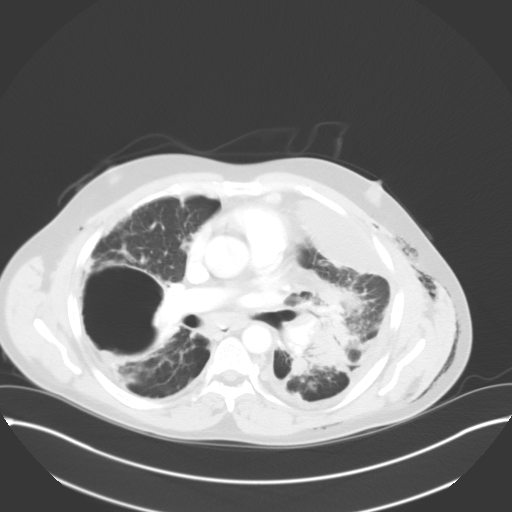
[im 68/85  lung]
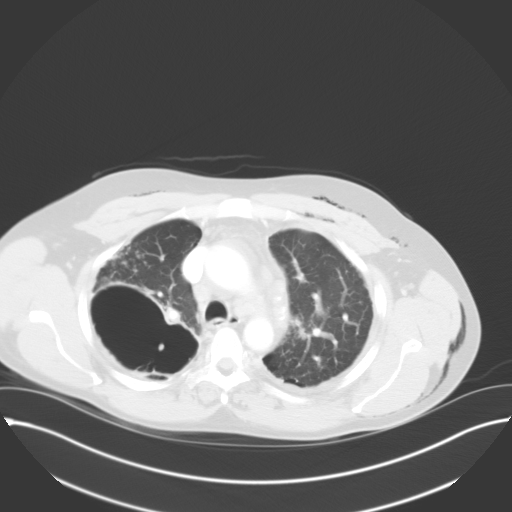
[im 72/85  lung]
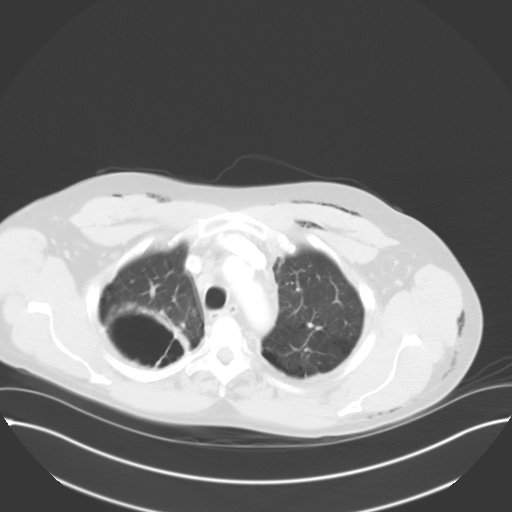
[im 80/85  lung]
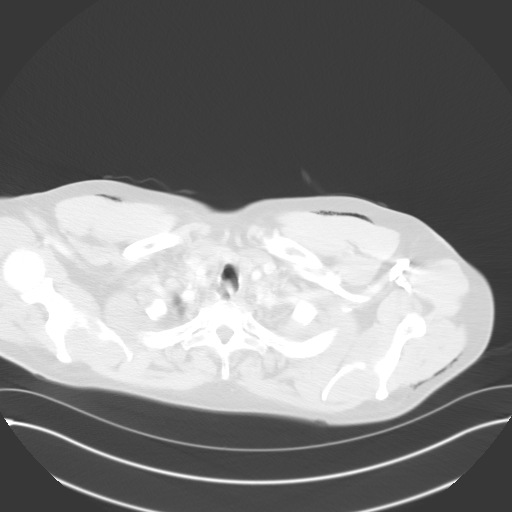

[Series 5: coronal · coronal · 0.83mm/px · 3 of 86 slices shown]
[im 18/86  lung]
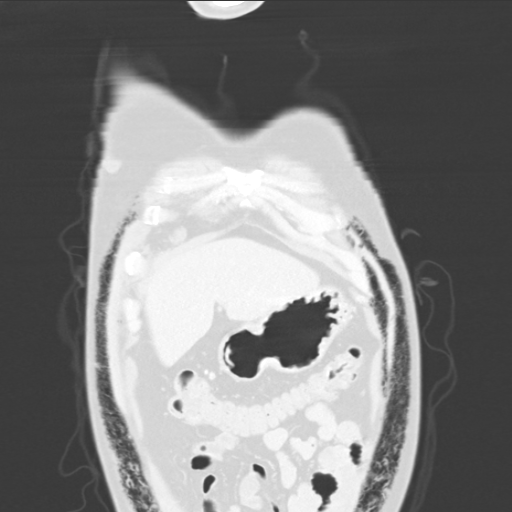
[im 35/86  lung]
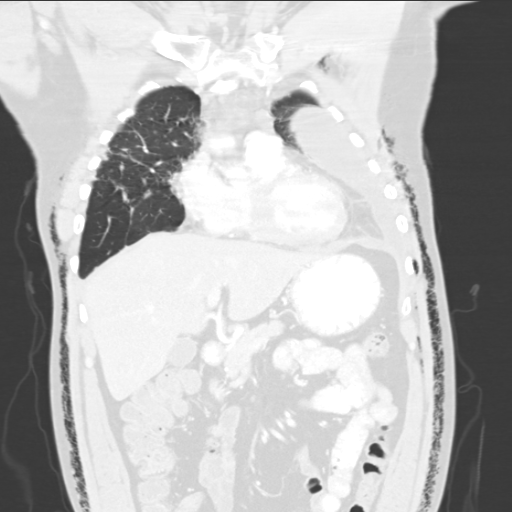
[im 52/86  lung]
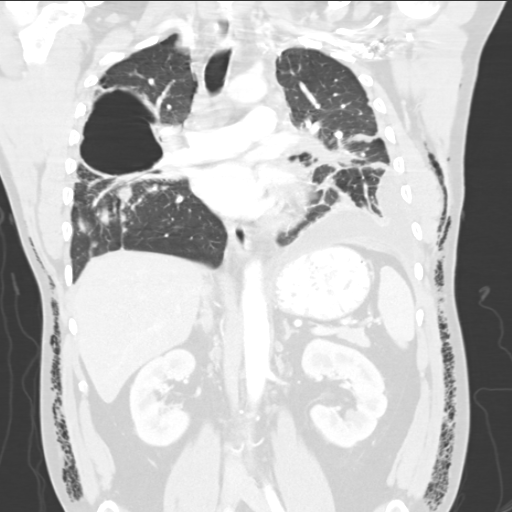

[15 of 36 positions shown; findings below may reference images not displayed]

FINDINGS: Multiple small mediastinal lymph nodes some which are
calcified not notably changed.  Index AP window node on image 16
measures 1.0 cm.  AP window node on image 17 measures 0.7 cm.  No
enlarging are or new lymphadenopathy identified.

Since the prior CT scan, the patient has developed a moderate left
pleural effusion.  A component of effusion in the anterior chest
wall measuring 8.6 cm AP by 3.5 cm transverse by 7.3 cm cranial-
caudal has a lenticular configuration suggesting loculation.  There
is no right pleural effusion or pericardial effusion.  Heart size
is normal.

Lungs again demonstrate a large bullous lesion in the posterior
right upper lobe which is unchanged.  Confluent perihilar opacities
are again seen.  Airspace opacity in the superior segment left
lower lobe appears mildly more extensive than on the most recent
comparison CT scan but not markedly different than on the
02/14/2012 exam. Opacity in the right lower lobe is unchanged.
Peribronchovascular nodularity is again seen.  Subcutaneous edema
is present about the chest and worse on the left.  No pneumothorax
is identified. No focal bony abnormality is identified
IMPRESSION: 1.  Increase in a small to moderate left pleural effusion since
most recent examination.  Component along the anterior chest wall
has a lenticular configuration and may be loculated.
2.  Perihilar opacities and traction bronchiectasis consistent with
history of sarcoidosis.  Extent of opacity in the superior segment
left lower lobe appears somewhat increased since the 06/27/2012
study but not notably changed compared 02/14/12.  This could be due
to increased atelectasis or pneumonia.
3.  Negative for pneumothorax.  Extensive subcutaneous air about
the chest noted.

CT ABDOMEN AND PELVIS
FINDINGS: The gallbladder, liver, spleen, adrenal glands, pancreas
and kidneys all appear normal.  No lymphadenopathy or fluid is
identified.  No free intraperitoneal air is seen.  There is
extensive subcutaneous emphysema diffusely about the abdomen.
Visualized small and large bowel loops are unremarkable.  No focal
bony abnormality.
IMPRESSION: Diffuse subcutaneous edema about the abdomen.  The abdomen is
otherwise negative.

## 2013-10-11 IMAGING — CR DG CHEST 1V PORT
1 series · 1 of 1 positions shown · non-contrast
Comparison: Prior radiograph from 07/29/2012 and CT from
07/30/2012.

CLINICAL DATA: status post pneumo, shortness of breath

PORTABLE CHEST - 1 VIEW

[AP]
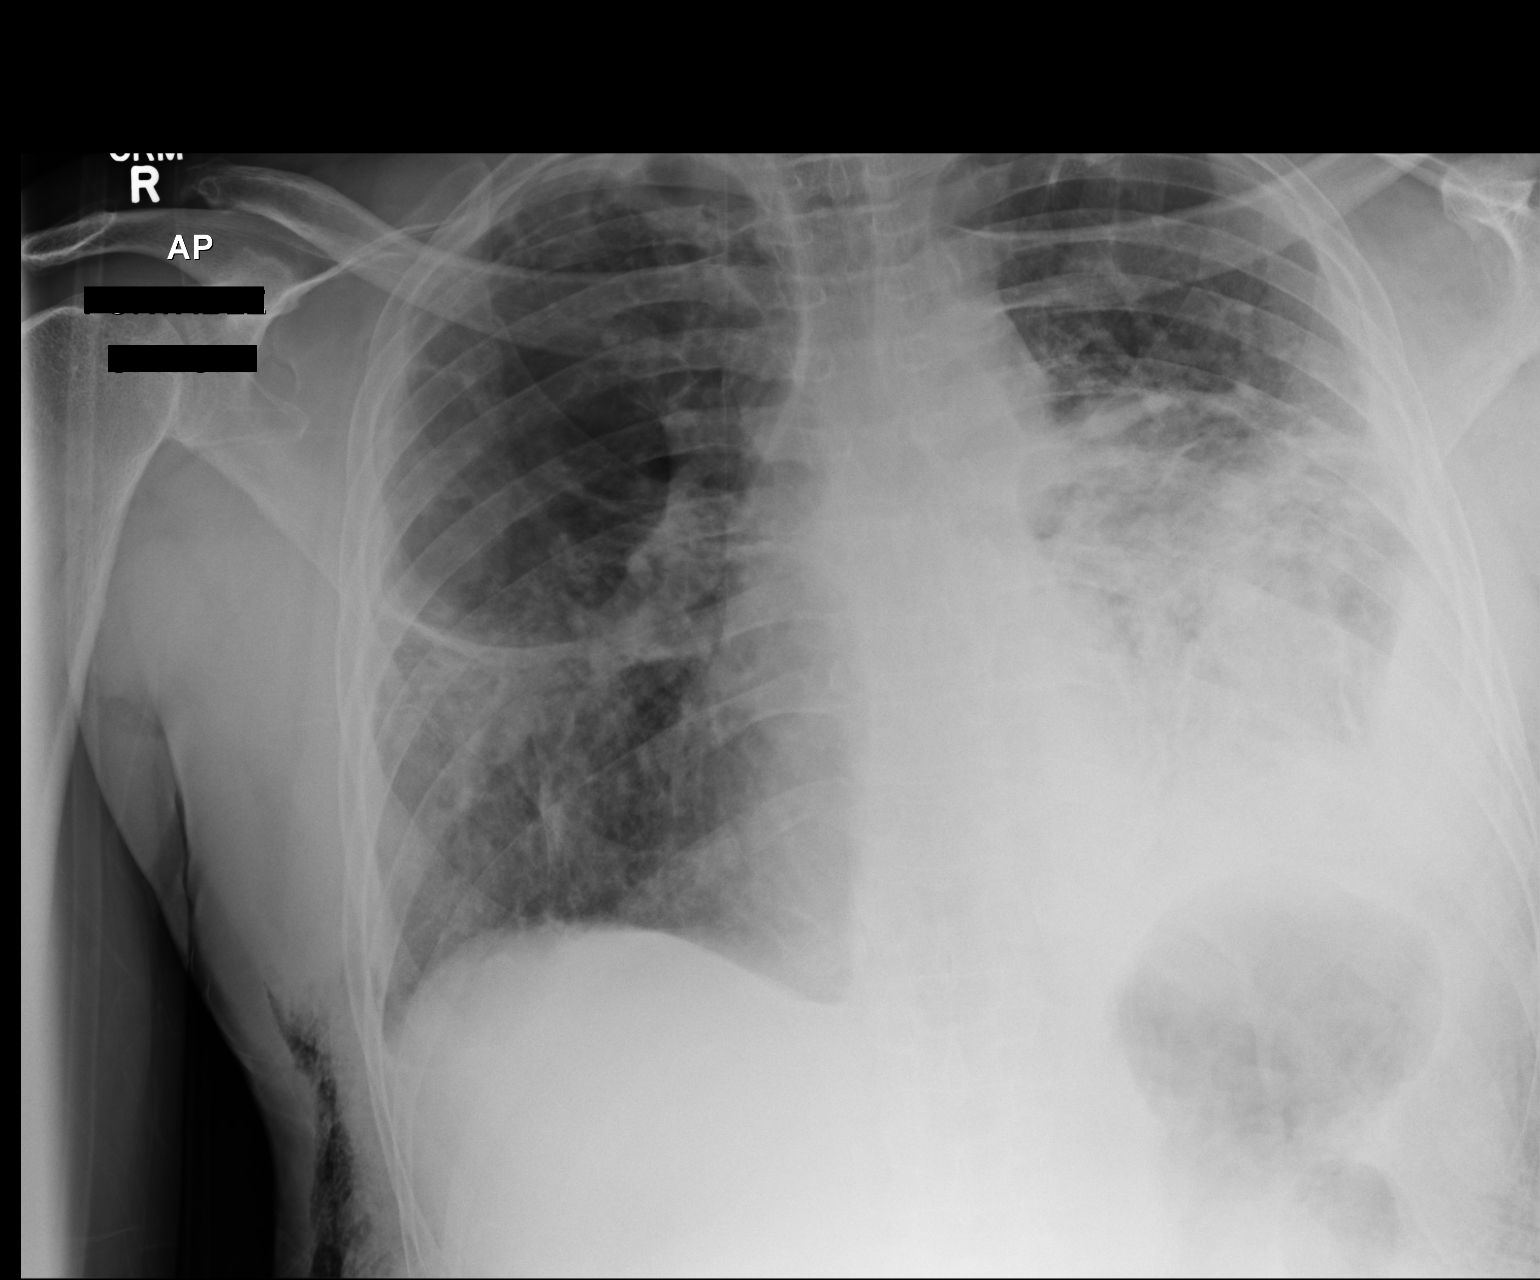

[1 of 1 positions shown; findings below may reference images not displayed]

FINDINGS: Cardiac silhouette is grossly stable.

Bullous changes involving the right upper lung are grossly
unchanged.  A left pleural effusion is again seen, layering at the
lateral left hemithorax, grossly similar as compared to the prior
CT.  This effusion may be partially loculated.  There has been
interval worsening of parenchymal airspace opacity involving the
left lower lobe and lingula, worrisome for progressive pneumonia.,
opacity within the right lung is not significantly changed. No
pneumothorax is identified.

Subcutaneous emphysema is again noted within the visualized right
chest wall.
IMPRESSION: 1.  Interval worsening of consolidative airspace disease involving
the left lower lobe and lingula, worrisome for progressive
pneumonia.
2.  Similar appearance of left pleural effusion, which may be
partially loculated.
3.  No pneumothorax.

## 2013-10-12 ENCOUNTER — Ambulatory Visit (INDEPENDENT_AMBULATORY_CARE_PROVIDER_SITE_OTHER): Payer: 59 | Admitting: Emergency Medicine

## 2013-10-12 ENCOUNTER — Encounter: Payer: Self-pay | Admitting: Emergency Medicine

## 2013-10-12 VITALS — BP 120/60 | HR 107 | Temp 98.1°F | Ht 67.0 in | Wt 181.8 lb

## 2013-10-12 DIAGNOSIS — G4733 Obstructive sleep apnea (adult) (pediatric): Secondary | ICD-10-CM

## 2013-10-12 DIAGNOSIS — Z23 Encounter for immunization: Secondary | ICD-10-CM

## 2013-10-12 DIAGNOSIS — J479 Bronchiectasis, uncomplicated: Secondary | ICD-10-CM

## 2013-10-12 DIAGNOSIS — Z9989 Dependence on other enabling machines and devices: Principal | ICD-10-CM

## 2013-10-12 IMAGING — CT CT GUIDANCE NEEDLE PLACEMENT
1 of 2 series · 14 of 32 positions shown, 19 images · non-contrast
Comparison: Chest CT - 07/30/2012

INDICATION: History of pulmonary sarcoidosis and recent tension
pneumothorax, post lung resection and pleurodesis, now with
indeterminate anteriorly located loculated left-sided pleural
effusion.

CT GUIDED ASPIRATION OF LOCULATED LEFT SIDED PLEURAL EFFUSION

[Series 2: i-spiral 5.0 b30f · axial · 0.75mm/px · z∈[+114,+216]mm · 14 of 33 slices shown, 19 images]
[im 2/33  soft-tissue]
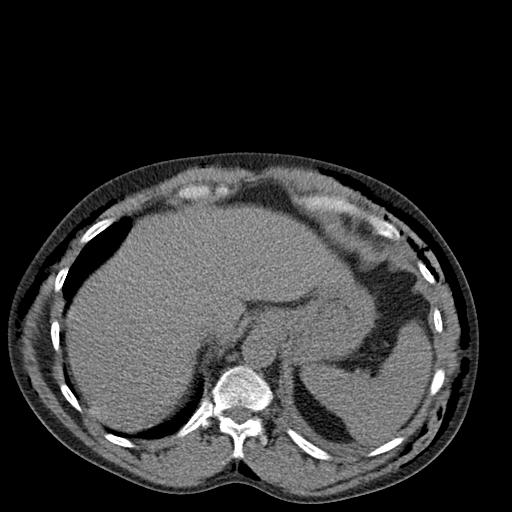
[im 2/33  bone]
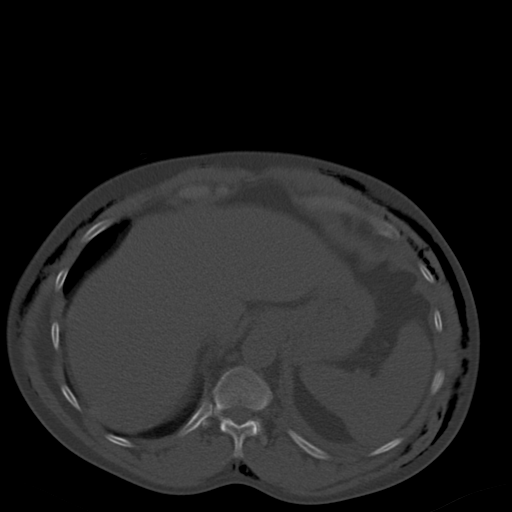
[im 5/33  soft-tissue]
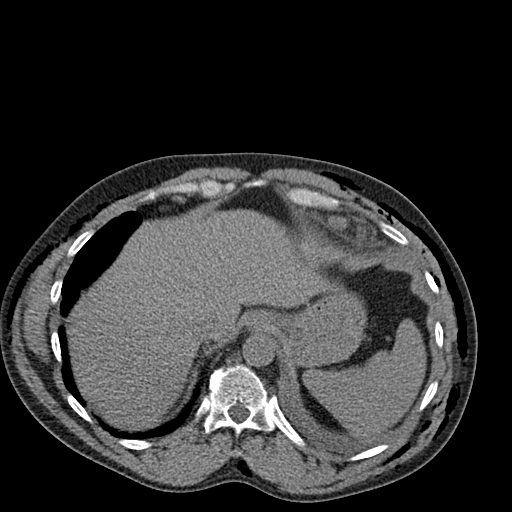
[im 7/33  soft-tissue]
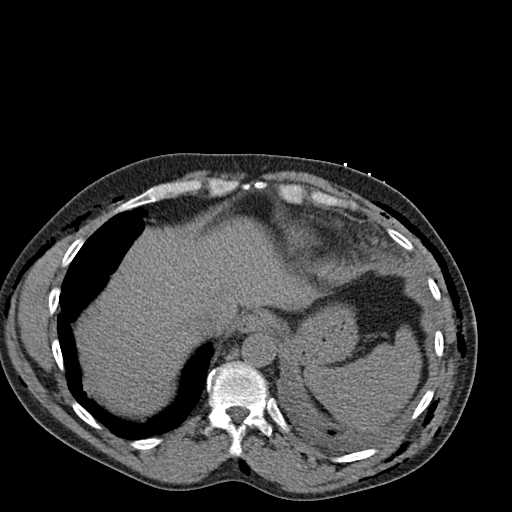
[im 10/33  soft-tissue]
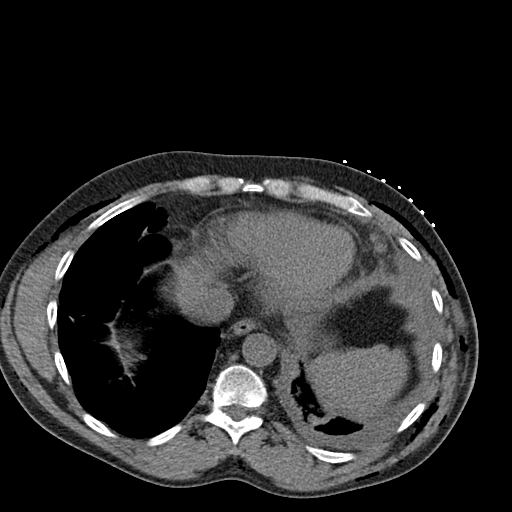
[im 11/33  soft-tissue]
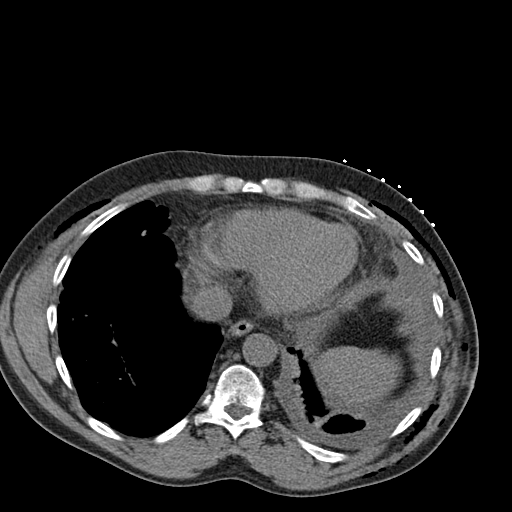
[im 14/33  soft-tissue]
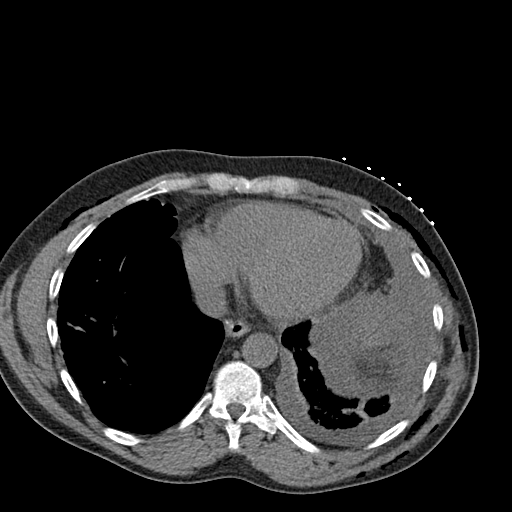
[im 17/33  soft-tissue]
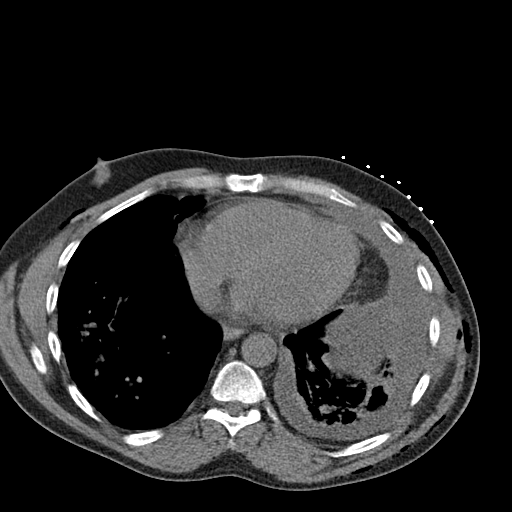
[im 19/33  soft-tissue]
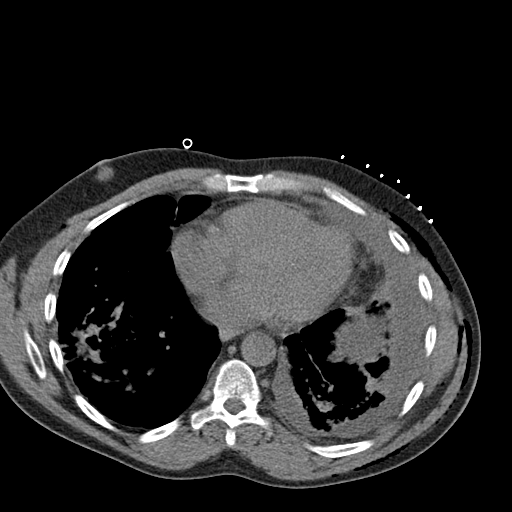
[im 22/33  soft-tissue]
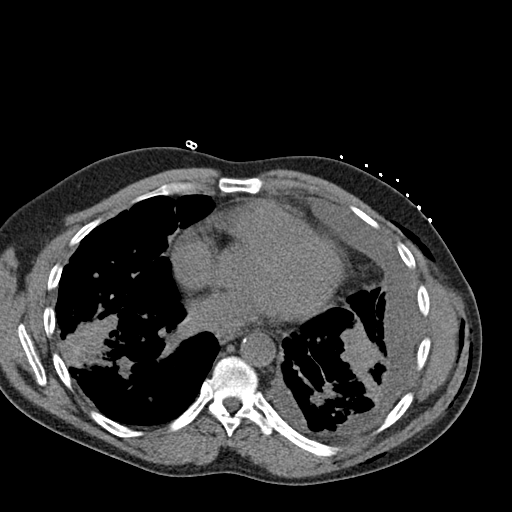
[im 22/33  bone]
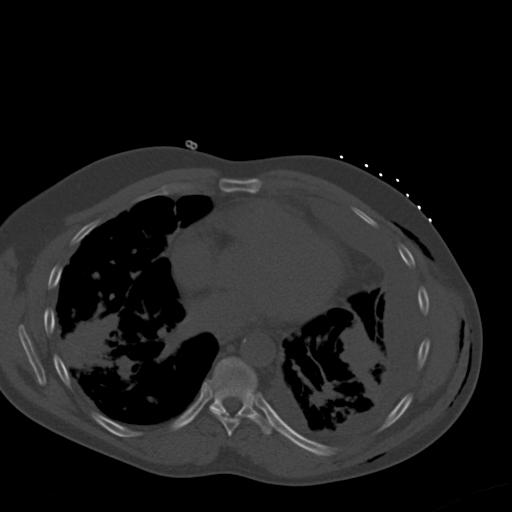
[im 23/33  soft-tissue]
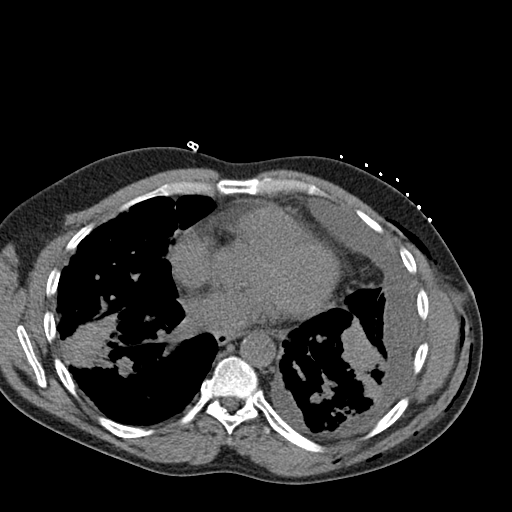
[im 26/33  soft-tissue]
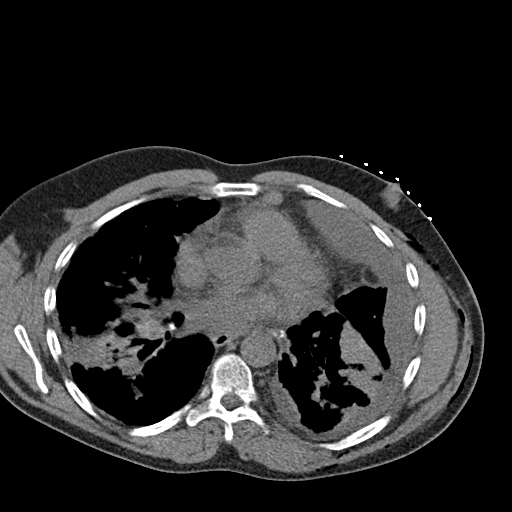
[im 26/33  lung]
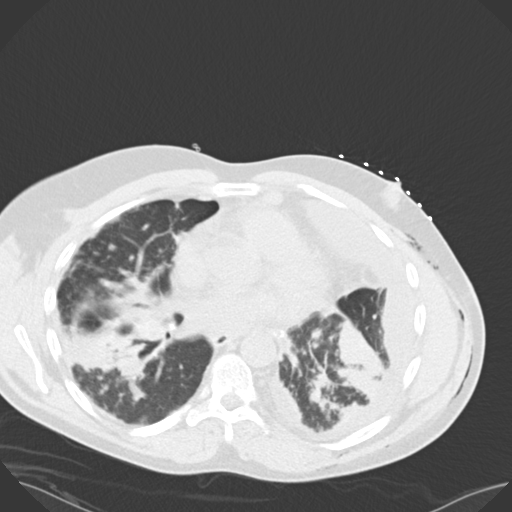
[im 28/33  soft-tissue]
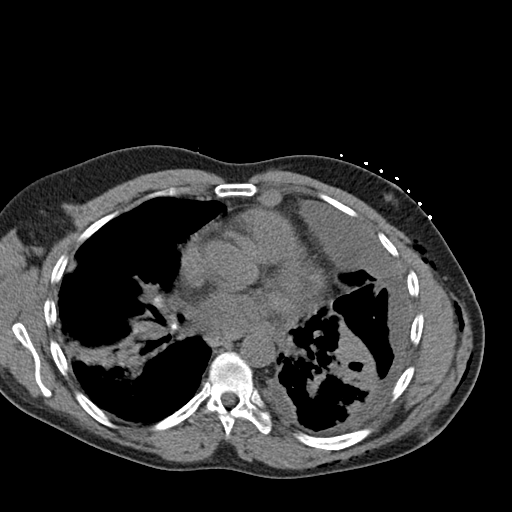
[im 28/33  lung]
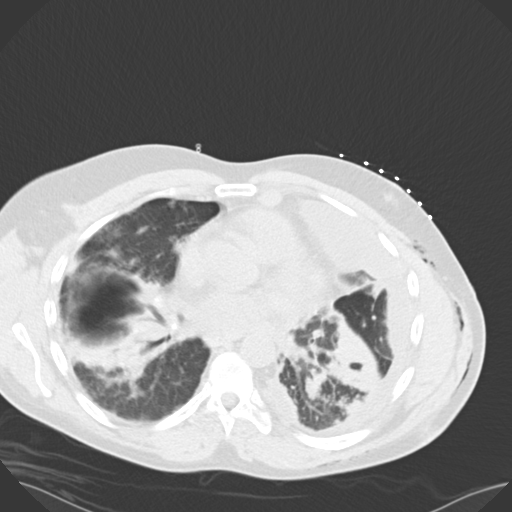
[im 29/33  lung]
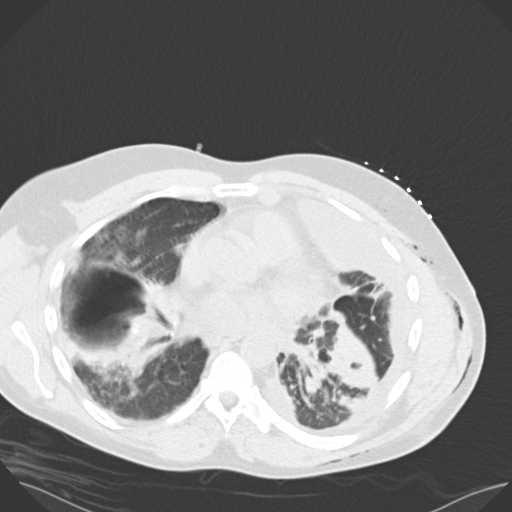
[im 31/33  soft-tissue]
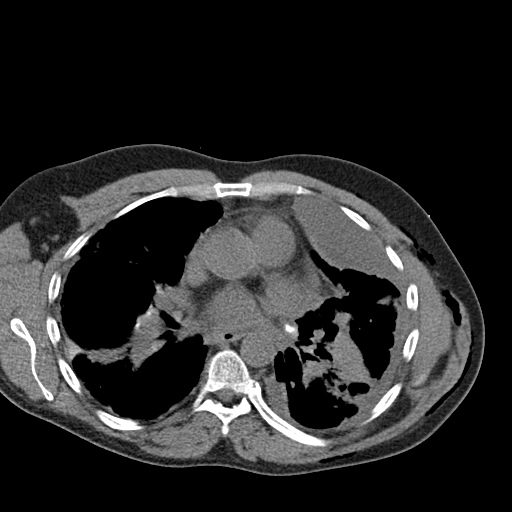
[im 31/33  lung]
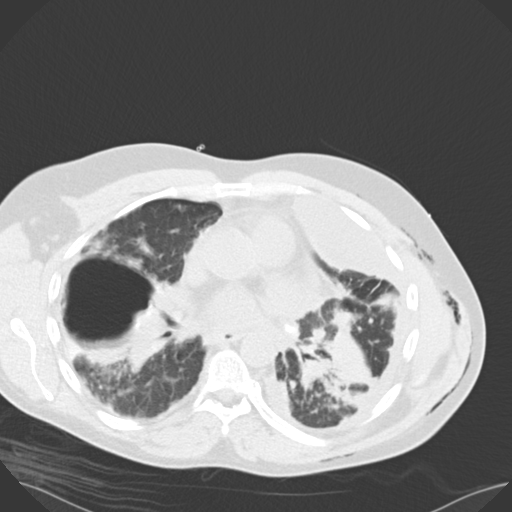

[14 of 32 positions shown; findings below may reference images not displayed]

Intravenous Medications: None

Contrast: None

Complications: None immediate

TECHNIQUE/FINDINGS:

Preprocedural ultrasound scanning failed to adequately delineate
the known anteriorly loculated pleural fluid within the left
hemithorax, likely secondary to known overlying subcutaneous
emphysema within the chest wall.  As such, the decision was made to
perform the procedure under CT fluoroscopic guidance.   Informed
consent was obtained from the patient following an explanation of
the procedure, risks, benefits and alternatives.  A time out was
performed prior to the initiation of the procedure.

The patient was positioned supine on the CT table and a limited CT
was performed for procedural planning demonstrating grossly
unchanged appearance of small to moderate sized loculated pleural
effusion within the anterior aspect of the left hemithorax.  The
procedure was planned.  The operative site was prepped and draped
in the usual sterile fashion.   Appropriate trajectory was
confirmed with a 22 gauge spinal needle after the adjacent tissues
were anesthetized with 1% Lidocaine with epinephrine.

Under intermittent CT guidance, a 5-French, 7 cm Iwase Miyu needle
was advanced into the anterior loculated left-sided pleural fluid
collection and approximately 90 ml of serous fluid was aspirated.
The aspirated fluid was tapped and sent to the laboratory for
analysis.

Limited postprocedural imaging demonstrates near resolution of
anterior loculated pleural fluid without development of significant
pneumothorax.  A dressing was placed.  The patient tolerated the
procedure well without immediate postprocedural complication.
IMPRESSION: Successful CT guided aspiration of loculated pleural fluid within
the anterior aspect of the left hemithorax.

## 2013-10-12 NOTE — Addendum Note (Signed)
Addended by: Tommie SamsSILVA, Zakira Ressel S on: 10/12/2013 02:31 PM   Modules accepted: Orders

## 2013-10-12 NOTE — Assessment & Plan Note (Signed)
Frequent exacerbations - ? Whether his cavitary process is a factor. He will likely need chronic suppressive therapy - cycled abx +/- pred. I will check repeat CT chest first, better define his structural disease. Follow in 1 month to review.

## 2013-10-12 NOTE — Assessment & Plan Note (Signed)
Continue CPAP qhs 

## 2013-10-12 NOTE — Progress Notes (Signed)
Subjective:   Patient ID: [name], male    DOB: 11/08/1965, 48 y.o.   MRN: [id number]  HPI 48 yo man, never smoker, hx sarcoidosis dx by transbronchial bx's in 2002 and again in 2011 or 2012, also OSA on CPAP. He has occasional flares that sound asthmatic in nature, gets treated with prednisone. He flares typically a few times a year, but he has also gone for over a year without an exacerbation. He has never been on everyday meds, has been on pred with exacerbations - longest he was on it was 90 days. He has also been on a steroid-sparing agent before (?MTX), but it was stopped. He has a large cavity in R lung, scattered infiltrates. He has been on Advair before, Spiriva before, not currently. Has albuterol available prn, uses almost every day. [facility].   ROV 01/06/12 -- sarcoidosis, R lung cavitary disease. Was recently hospitalized for an acute exacerbation. D/c after pred taper. Last CT scan was 4/13. He is using nebs 2 -3 x a day.   04/06/12  Post Hospital follow up  Patient presents for a post hospital followup. Patient was recently admitted with acute bronchiectasis, exacerbation, complicated by underlying sarcoidosis. Patient has had several recent hospitalizations and emergency room visits.   He was treated with aggressive pulmonary hygiene, antibiotics, and a steroid taper. Since discharge pt reports breathing is doing well overall but still wheezing, SOB, head congestion and prod cough with clear  Mucus .  Patient denies any hemoptysis, orthopnea, PND, or leg swelling. During admission. Patient has had cocaine, positive urine drug screen in his past. He did receive a social work consult. Patient denies any drug use at today's visit.  He was discharged on prednisone to recommend to hold at 20 mg however, patient misunderstood instructions and has stopped all steroids.  ROV 05/18/12 -- sarcoidosis, bronchiectasis and R cavitary disease. Regular f/u visit. Has  been doing well, some intermittent SOB. Taking allegra and benadryl. He is on QVAR daily, off prednisone. Uses SABA rarely. He is reliable w CPAP.   ROV 06/27/12 -- sarcoidosis, bronchiectasis and R cavitary disease. Also OSA, HTN w diastolic dysfxn.  Was admitted for flare obstructive lung disease/sarcoidosis 6/6 - 6/8. He underwent repeat CT scan chest today >> stable bullous and parenchymal changes compared with 02/2012. He feels back to baseline clinically. Having some cough, clear phlegm. On flonase, allegra and benadryl.   post hospital followup. Patient returns for a post hospital followup. Patient had a complicated hospital course, which he was admitted June 30, and discharged on 08/04/2012. Patient was admitted with acute respiratory failure with a left tension pneumothorax requiring mechanical ventilation. Patient had a persistent left pneumothorax with subcutaneous emphysema around his chest tube. He was seen by thoracic surgery and underwent a left video-assisted thoracoscopy on July 7 with stapling of apical blebs and mechanical thoracentesis. Pt cont w/ dyspnea/fevers.  CT scan on July 20 showed a, moderate left pleural effusion with loculation anteriorly/? HCAP >Underwent thoracentesis was done with 90 cc removed. Analysis was consistent with an exudative effusion and his culture data was negative. He was treated with abx and discharged on Levaquin to complete a ten-day course. And steroid taper-which he has a  few days left.  He was seen by thoracic surgery yesterday.chest x-ray showed a mild right apical pneumothorax. That is unchanged from previous exam.   Since discharge he is feeling better but still very weak.  Feels  breathing is  doing well overall.  does report some pain at the incision site and in the left lung.  finished levaquin yesterday, still taking prednisone at 3tabs daily. No hemoptysis , orthopnea or edema.   ROV 09/15/12 - sarcoidosis, bronchiectasis and R cavitary disease,  complicated hospitalization for PTX as above. Hasn't been using his CPAP in order to avoid positive pressure following his pneumothorax. He was seen 8/21 and treated with levofloxacin and prednisone for bronchitis versus pneumonia. He needs his portable concentrator, [employer] doesn't provide. He also [facility] get [facility], [facility]. Uses albuterol prn, about once a day. Pulmicort bid.   ROV 01/17/13 -- sarcoidosis, bronchiectasis and R cavitary disease, OSA back on CPAP. Hx PTX on L. He began to have an exacerbation end of Dec > treated with pred taper and now improved. He is using albuterol nebs prn, pulmicort nebs bid.   ROV 02/28/13 -- sarcoidosis, bronchiectasis and R cavitary disease, OSA back on CPAP. I have sent him for transplant eval, not a candidate due to positive drug screen for cocaine. He presents today with more dyspnea. Chills for 3-4 days. He has cough with yellow/green.   ROV 03/28/13 -- sarcoidosis, bronchiectasis and R cavitary disease. OSA on CPAP. He has some numbness and pain in his L chest and flank. His breathing and coughing are better. He is interested in changing to inhalers instead of nebs.    Acute OV 05/15/13  Complains of increased  SOB, wheezing, tightness, prod cough with yellow mucus x3 days.  Denies f/c/s, hemoptysis, nausea, vomiting, orthopnea, edema , or rash, chest pain  No recent travel or abx use.  Remains on Symbicort Twice daily  Off all steroids .  Says he has not used cocaine >3 months, drug counseling given.  CXR today with no acute process., chronic changes   ROV 10/12/13 -- hx of sarcoidosis, bronchiectasis and R cavitary disease, OSA back on CPAP. Was [facility] by [facility] May and [facility] as [facility]. He has been treated for AE on multiple occasions since our last visit (more on than off). Returns for f/u.     Objective:  Physical Exam  Filed Vitals:   10/12/13 1402  BP: 120/60  Pulse: 107  Temp: 98.1 F (36.7 C)  TempSrc:  Oral  Height: 5\' 7"  (1.702 m)  Weight: 181 lb 12.8 oz (82.464 kg)  SpO2: 91%    Gen: Pleasant,  normal affect, NAD  ENT: No lesions,  mouth clear,  oropharynx clear, no postnasal drip  Neck: No JVD, no TMG, no carotid bruits  Lungs:  Diminshed BS in bases, no wheezes  Cardiovascular: RRR, heart sounds normal, no murmur or gallops, no peripheral edema  Musculoskeletal: No deformities, no cyanosis or clubbing  Neuro: alert, non focal  Skin: Warm, no lesions or rashes     Assessment & Plan:  OSA on CPAP Continue CPAP qhs  Bronchiectasis without acute exacerbation Frequent exacerbations - ? Whether his cavitary process is a factor. He will likely need chronic suppressive therapy - cycled abx +/- pred. I will check repeat CT chest first, better define his structural disease. Follow in 1 month to review.

## 2013-10-12 NOTE — Patient Instructions (Signed)
We will repeat your CT scan of the chest  Continue your current medications as you have been taking them  Wear your CPAP every night Follow with Dr Delton CoombesByrum in 1 month to discuss your CT scan and plans for treatment with antibiotics and / or prednisone.  Flu shot today

## 2013-10-14 IMAGING — CR DG CHEST 1V PORT
1 series · 1 of 1 positions shown · non-contrast
Comparison: Portable chest x-ray of 08/02/2012

CLINICAL DATA: Shortness of breath, follow up of effusion

PORTABLE CHEST - 1 VIEW

[AP]
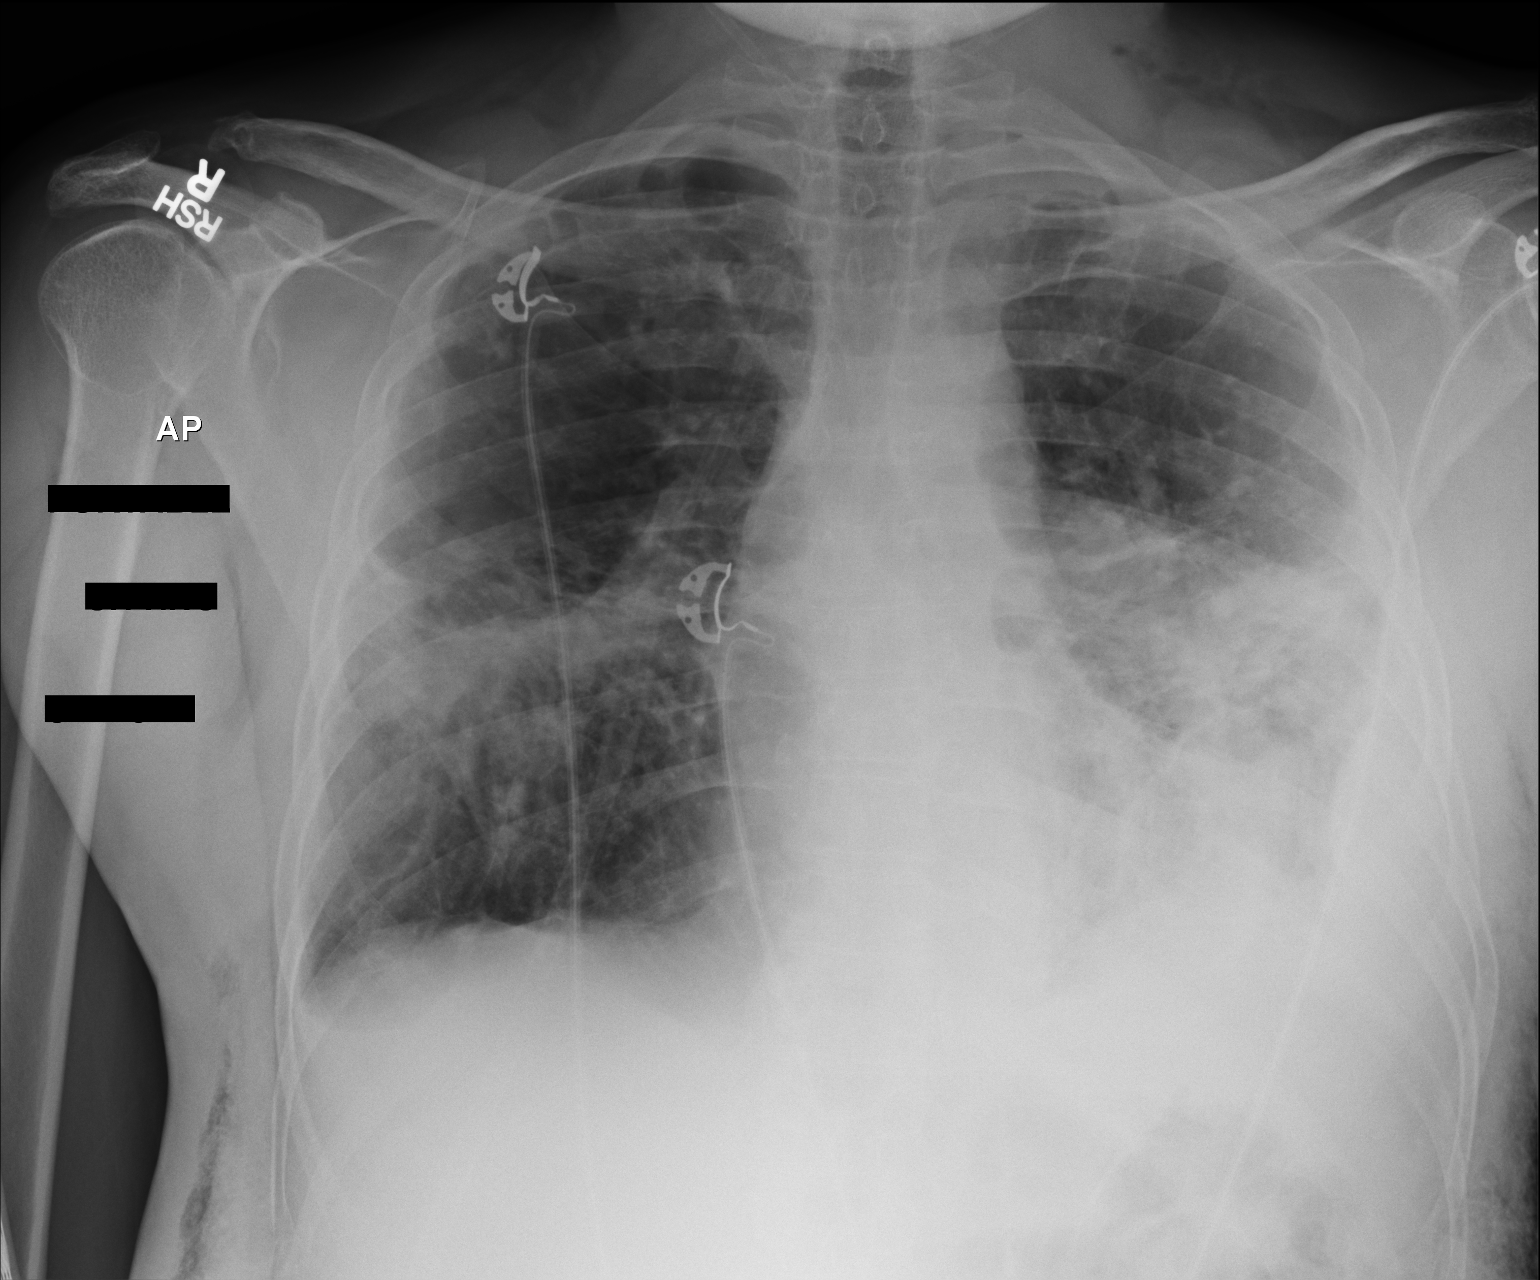

[1 of 1 positions shown; findings below may reference images not displayed]

FINDINGS: The lungs are not quite as well aerated.  Bibasilar
opacities left greater than right persist. There do appear to be
pleural effusions present left larger than right. A small amount of
right chest wall subcutaneous air is noted, with some subcutaneous
air in the left neck as well.  No definite pneumothorax is seen.
IMPRESSION: Persistent bibasilar opacities left greater than right.  No
definite pneumothorax.

## 2013-10-19 ENCOUNTER — Ambulatory Visit (INDEPENDENT_AMBULATORY_CARE_PROVIDER_SITE_OTHER)
Admission: RE | Admit: 2013-10-19 | Discharge: 2013-10-19 | Disposition: A | Discharge status: Home / Self Care | Payer: 59 | Source: Ambulatory Visit | Attending: Emergency Medicine | Admitting: Emergency Medicine

## 2013-10-19 DIAGNOSIS — J479 Bronchiectasis, uncomplicated: Secondary | ICD-10-CM

## 2013-10-20 IMAGING — CR DG CHEST 2V
2 series · 2 of 2 positions shown · non-contrast
Comparison: August 03, 2012.

CLINICAL DATA: Sarcoidosis

CHEST - 2 VIEW

[w chest pa]
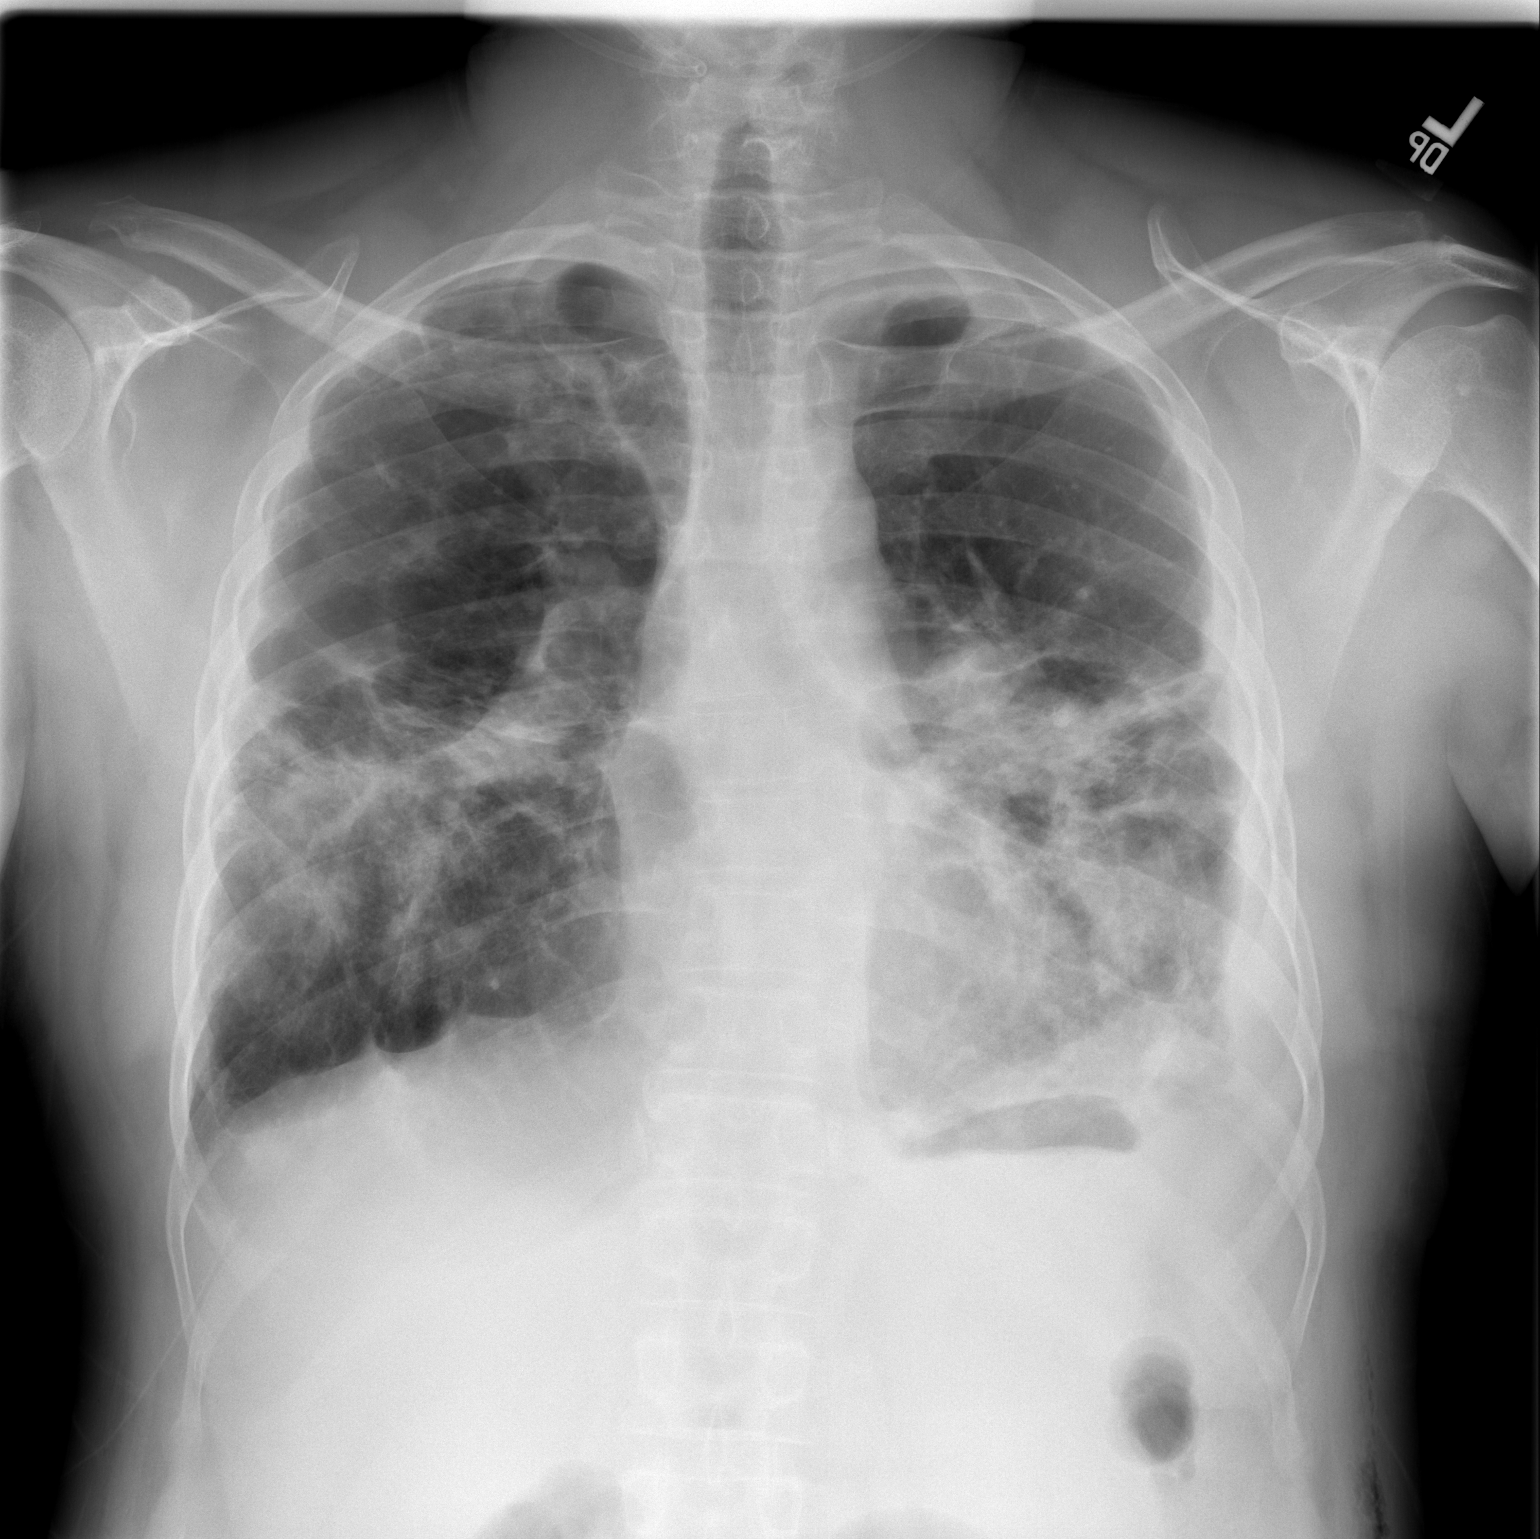

[w chest lat]
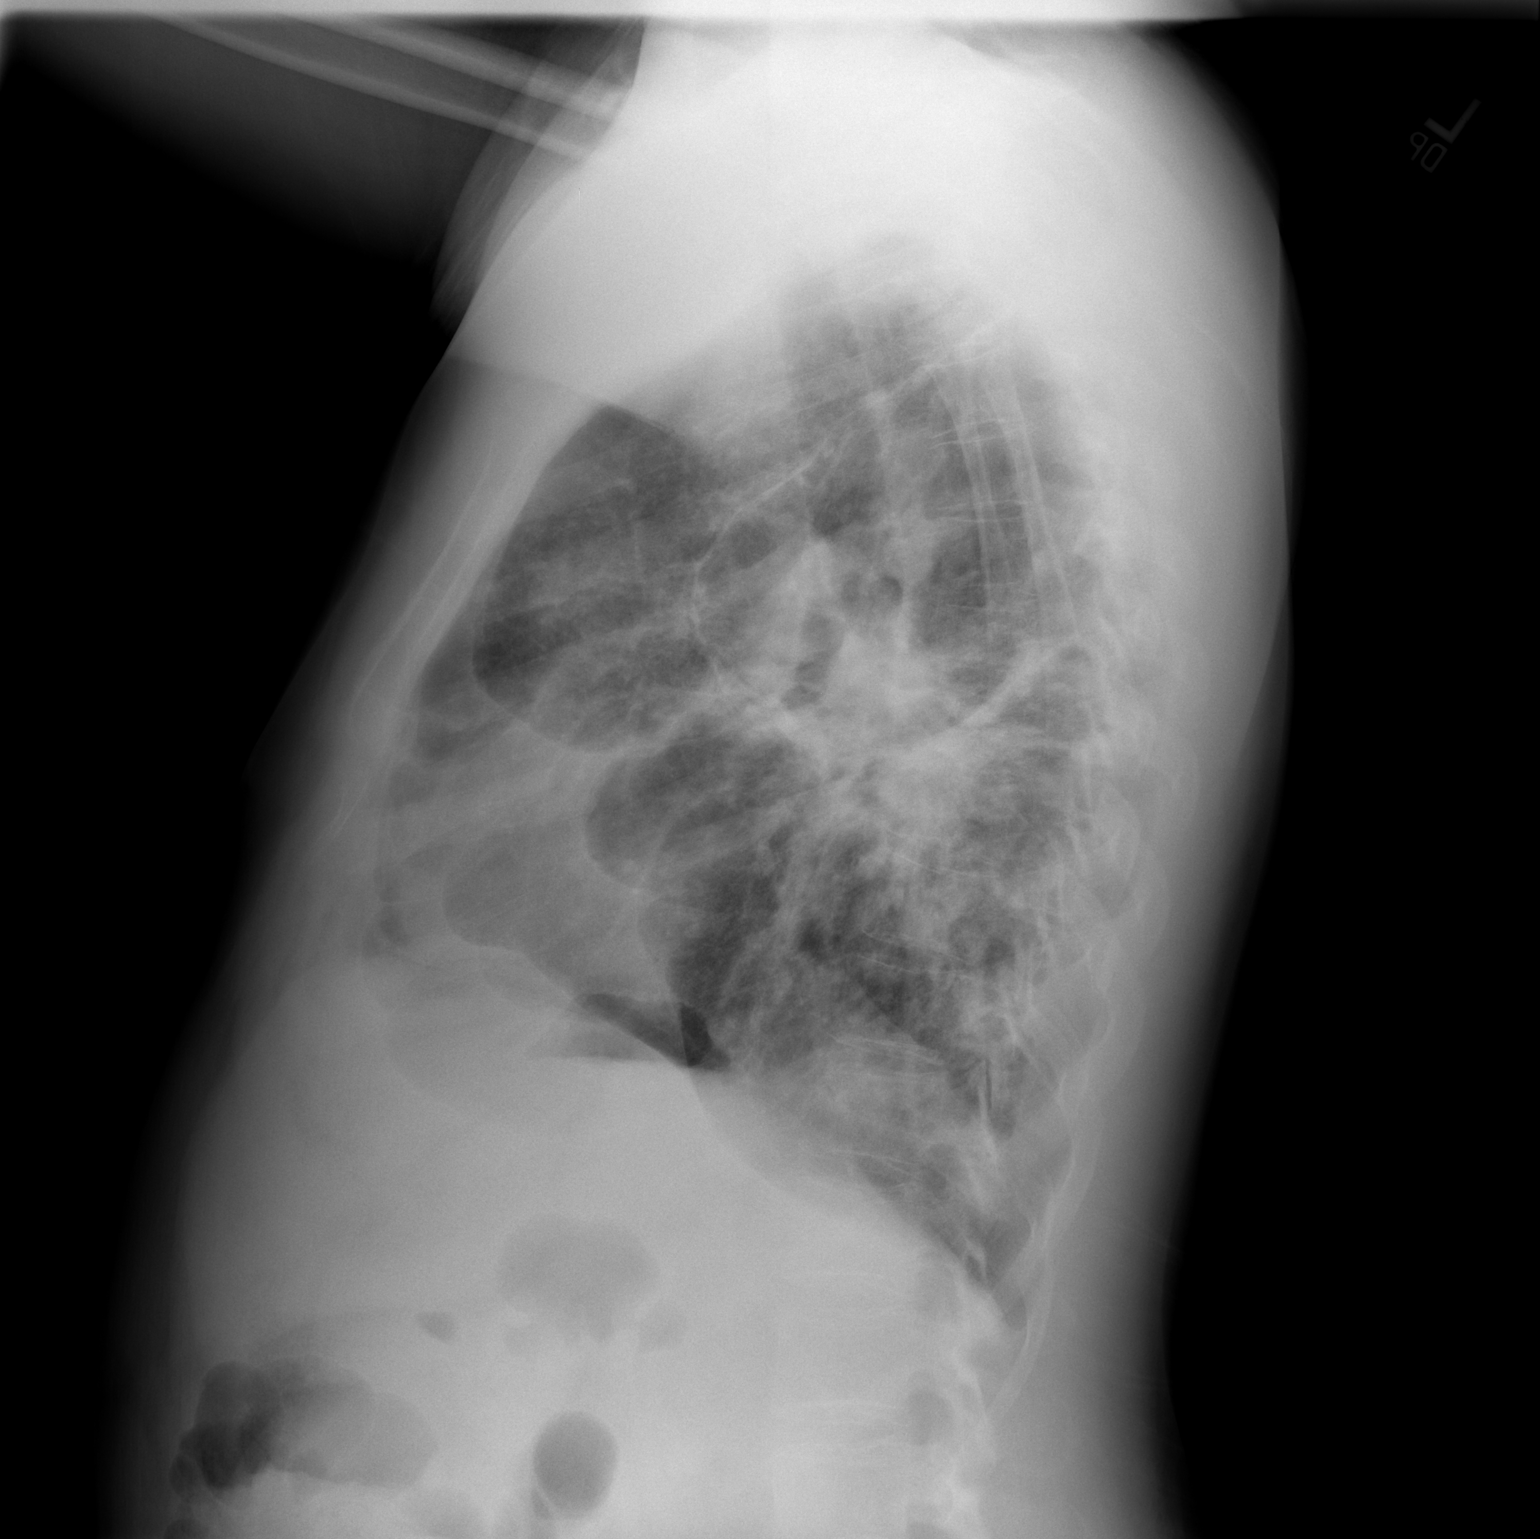

[2 of 2 positions shown; findings below may reference images not displayed]

FINDINGS: Cardiomediastinal silhouette appears normal. Left lateral
pleural thickening is noted which was present on prior exam.
Persistent irregular opacities are noted in both lungs, with left
greater than right, and these are unchanged compared to prior exam.
This most likely represents scarring related to history of
sarcoidosis.  Mild right apical pneumothorax is noted which is
unchanged compared to prior exam.
IMPRESSION: Mild right apical pneumothorax is noted which is unchanged compared
to prior exam. Stable bilateral lung opacities compared to prior
exam consistent with history of sarcoidosis.

## 2013-10-22 ENCOUNTER — Telehealth: Payer: Self-pay | Admitting: Emergency Medicine

## 2013-10-22 NOTE — Telephone Encounter (Signed)
Pt had CT scan done 10/19/13. Does not look like it has been resulted on. RB will be in the office tomorrow afternoon to advise on results. Called pt and LTMTCB x1

## 2013-10-22 NOTE — Telephone Encounter (Signed)
Pt returned call.  Advised pt that RB will be in the office tomorrow PM to advise on results.  Pt verbalized understanding & will await returned call.  Antionette FairyHolly D Pryor

## 2013-10-22 NOTE — Telephone Encounter (Signed)
Please advise RB thanks 

## 2013-10-23 NOTE — Telephone Encounter (Signed)
Please lt the pt know that his CT scan is largely unchanged compared with his prior. There may be some slight progression of his sarcoid infiltrates. We will need to discuss further at his follow up visit.

## 2013-10-24 NOTE — Telephone Encounter (Signed)
LMTC x 1  

## 2013-10-25 NOTE — Telephone Encounter (Signed)
Called and spoke with pt and he is aware of results per RB.  Nothing further is needed.  

## 2013-11-11 IMAGING — CR DG CHEST 2V
2 series · 2 of 2 positions shown · non-contrast
Comparison: 08/09/2012

CLINICAL DATA: Short of breath.  Recent lung surgery.  Sarcoid

CHEST - 2 VIEW

[view not recorded (1 of 2)]
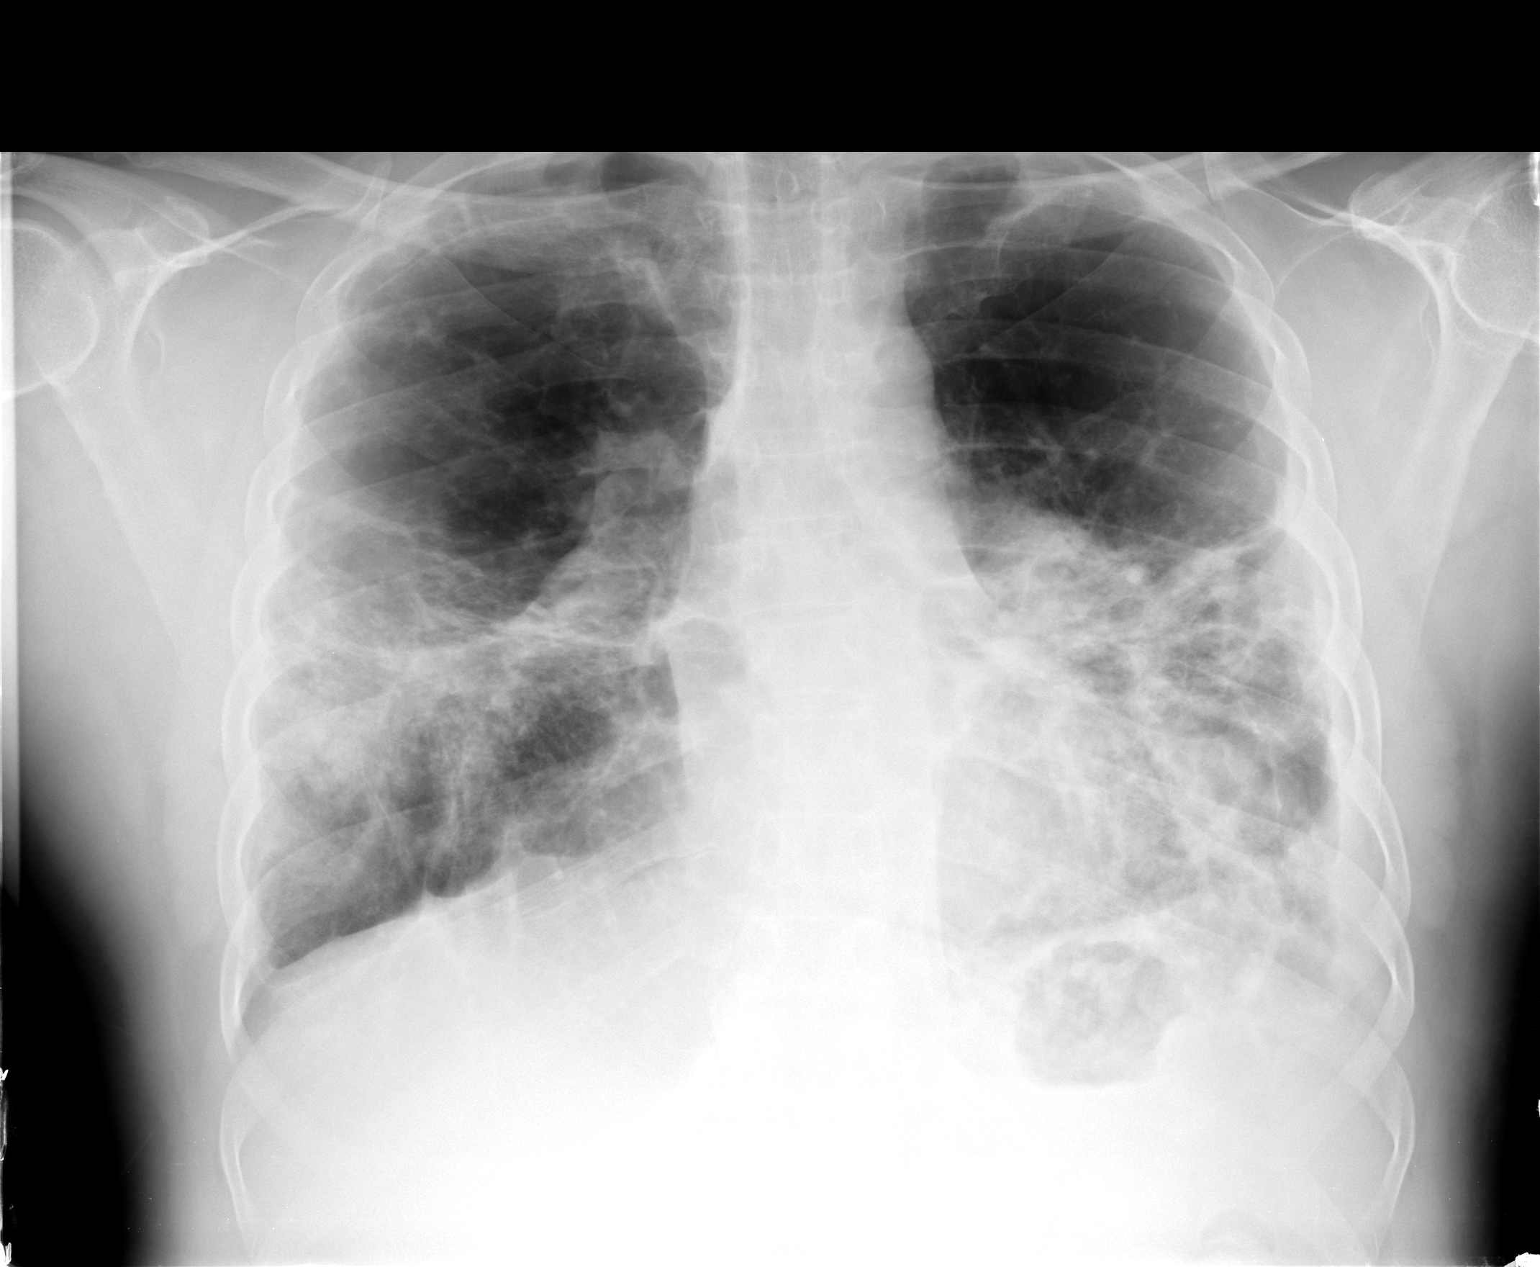

[view not recorded (2 of 2)]
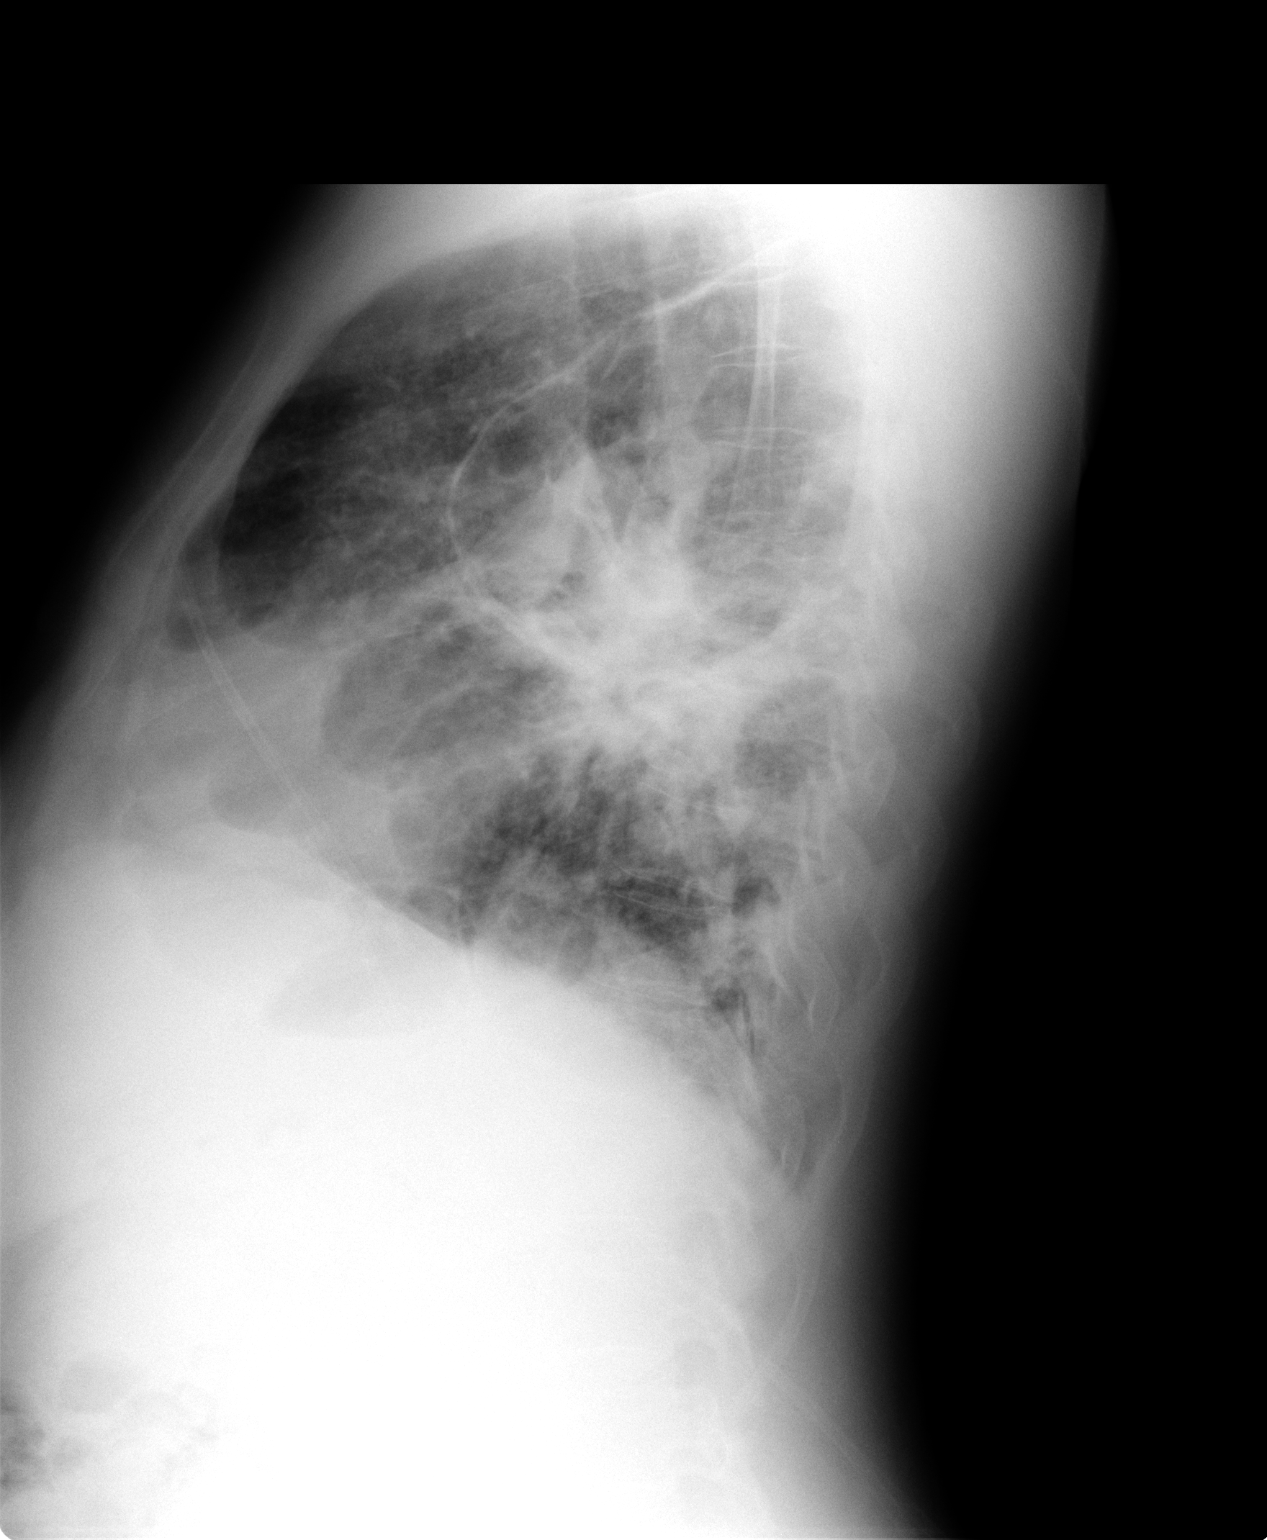

[2 of 2 positions shown; findings below may reference images not displayed]

FINDINGS: Large air cyst in the right upper lobe is unchanged.  On
the frontal view this has the appearance of a pneumothorax.  This
is unchanged from the prior study however based on the CT of
08/09/2012 this appears to represent a large air cyst.

Diffuse bilateral airspace disease in the bases shows mild
progression.  Left pleural thickening unchanged.
IMPRESSION: Large air cyst right upper lobe is stable.

Diffuse bilateral airspace disease shows mild progression.
Question superimposed pneumonia.

## 2013-11-16 ENCOUNTER — Ambulatory Visit: Payer: 59 | Admitting: Emergency Medicine

## 2013-11-23 ENCOUNTER — Inpatient Hospital Stay (HOSPITAL_COMMUNITY)
Admission: EM | Admit: 2013-11-23 | Discharge: 2013-11-28 | DRG: 189 | Disposition: A | Discharge status: Home / Self Care | Payer: 59 | Attending: Pulmonary Disease | Admitting: Pulmonary Disease

## 2013-11-23 ENCOUNTER — Emergency Department (HOSPITAL_COMMUNITY): Payer: 59

## 2013-11-23 ENCOUNTER — Encounter (HOSPITAL_COMMUNITY): Payer: Self-pay | Admitting: Emergency Medicine

## 2013-11-23 DIAGNOSIS — G8929 Other chronic pain: Secondary | ICD-10-CM

## 2013-11-23 DIAGNOSIS — I10 Essential (primary) hypertension: Secondary | ICD-10-CM | POA: Diagnosis present

## 2013-11-23 DIAGNOSIS — J471 Bronchiectasis with (acute) exacerbation: Secondary | ICD-10-CM | POA: Diagnosis present

## 2013-11-23 DIAGNOSIS — J441 Chronic obstructive pulmonary disease with (acute) exacerbation: Secondary | ICD-10-CM

## 2013-11-23 DIAGNOSIS — J9621 Acute and chronic respiratory failure with hypoxia: Secondary | ICD-10-CM | POA: Diagnosis present

## 2013-11-23 DIAGNOSIS — D869 Sarcoidosis, unspecified: Secondary | ICD-10-CM

## 2013-11-23 DIAGNOSIS — E872 Acidosis: Secondary | ICD-10-CM | POA: Insufficient documentation

## 2013-11-23 DIAGNOSIS — T380X5A Adverse effect of glucocorticoids and synthetic analogues, initial encounter: Secondary | ICD-10-CM | POA: Diagnosis not present

## 2013-11-23 DIAGNOSIS — K219 Gastro-esophageal reflux disease without esophagitis: Secondary | ICD-10-CM | POA: Diagnosis present

## 2013-11-23 DIAGNOSIS — G4733 Obstructive sleep apnea (adult) (pediatric): Secondary | ICD-10-CM | POA: Diagnosis present

## 2013-11-23 DIAGNOSIS — Z79899 Other long term (current) drug therapy: Secondary | ICD-10-CM

## 2013-11-23 DIAGNOSIS — R739 Hyperglycemia, unspecified: Secondary | ICD-10-CM | POA: Diagnosis not present

## 2013-11-23 DIAGNOSIS — J9602 Acute respiratory failure with hypercapnia: Secondary | ICD-10-CM

## 2013-11-23 DIAGNOSIS — Z9114 Patient's other noncompliance with medication regimen: Secondary | ICD-10-CM | POA: Diagnosis present

## 2013-11-23 DIAGNOSIS — J9601 Acute respiratory failure with hypoxia: Secondary | ICD-10-CM

## 2013-11-23 DIAGNOSIS — J811 Chronic pulmonary edema: Secondary | ICD-10-CM | POA: Diagnosis present

## 2013-11-23 DIAGNOSIS — R0602 Shortness of breath: Secondary | ICD-10-CM | POA: Diagnosis present

## 2013-11-23 LAB — BASIC METABOLIC PANEL
ANION GAP: 13 (ref 5–15)
BUN: 12 mg/dL (ref 6–23)
CO2: 28 mEq/L (ref 19–32)
Calcium: 9.4 mg/dL (ref 8.4–10.5)
Chloride: 102 mEq/L (ref 96–112)
Creatinine, Ser: 1.08 mg/dL (ref 0.50–1.35)
GFR, EST NON AFRICAN AMERICAN: 79 mL/min — AB (ref >90)
Glucose, Bld: 157 mg/dL — ABNORMAL HIGH (ref 70–99)
POTASSIUM: 4.5 meq/L (ref 3.7–5.3)
SODIUM: 143 meq/L (ref 137–147)

## 2013-11-23 LAB — CBC
HCT: 35.6 % — ABNORMAL LOW (ref 39.0–52.0)
Hemoglobin: 11.8 g/dL — ABNORMAL LOW (ref 13.0–17.0)
MCH: 27.1 pg (ref 26.0–34.0)
MCHC: 33.1 g/dL (ref 30.0–36.0)
MCV: 81.7 fL (ref 78.0–100.0)
PLATELETS: 301 10*3/uL (ref 150–400)
RBC: 4.36 MIL/uL (ref 4.22–5.81)
RDW: 12.6 % (ref 11.5–15.5)
WBC: 4.3 10*3/uL (ref 4.0–10.5)

## 2013-11-23 LAB — I-STAT TROPONIN, ED: Troponin i, poc: 0 ng/mL (ref 0.00–0.08)

## 2013-11-23 LAB — PRO B NATRIURETIC PEPTIDE: PRO B NATRI PEPTIDE: 97.5 pg/mL (ref 0–125)

## 2013-11-23 MED ORDER — ARFORMOTEROL TARTRATE 15 MCG/2ML IN NEBU
15.0000 ug | INHALATION_SOLUTION | Freq: Two times a day (BID) | RESPIRATORY_TRACT | Status: DC
  Administered 2013-11-23 – 2013-11-27 (×8): 15 ug via RESPIRATORY_TRACT
  Filled 2013-11-23 (×13): qty 2

## 2013-11-23 MED ORDER — PREDNISONE 20 MG PO TABS
60.0000 mg | ORAL_TABLET | Freq: Once | ORAL | Status: DC
  Filled 2013-11-23: qty 3

## 2013-11-23 MED ORDER — CYCLOBENZAPRINE HCL 10 MG PO TABS
10.0000 mg | ORAL_TABLET | Freq: Three times a day (TID) | ORAL | Status: DC | PRN
  Administered 2013-11-23 – 2013-11-27 (×4): 10 mg via ORAL
  Filled 2013-11-23 (×5): qty 1

## 2013-11-23 MED ORDER — ENOXAPARIN SODIUM 40 MG/0.4ML ~~LOC~~ SOLN
40.0000 mg | SUBCUTANEOUS | Status: DC
  Administered 2013-11-24 – 2013-11-27 (×4): 40 mg via SUBCUTANEOUS
  Filled 2013-11-23 (×7): qty 0.4

## 2013-11-23 MED ORDER — VANCOMYCIN HCL IN DEXTROSE 1-5 GM/200ML-% IV SOLN
1000.0000 mg | Freq: Three times a day (TID) | INTRAVENOUS | Status: DC
  Administered 2013-11-24 – 2013-11-26 (×7): 1000 mg via INTRAVENOUS
  Filled 2013-11-23 (×6): qty 200

## 2013-11-23 MED ORDER — HYDRALAZINE HCL 20 MG/ML IJ SOLN
10.0000 mg | INTRAMUSCULAR | Status: DC | PRN
  Administered 2013-11-24: 20 mg via INTRAVENOUS
  Filled 2013-11-23: qty 2

## 2013-11-23 MED ORDER — FAMOTIDINE 20 MG PO TABS
20.0000 mg | ORAL_TABLET | Freq: Two times a day (BID) | ORAL | Status: DC
  Administered 2013-11-23 – 2013-11-28 (×10): 20 mg via ORAL
  Filled 2013-11-23 (×13): qty 1

## 2013-11-23 MED ORDER — DIPHENHYDRAMINE HCL 25 MG PO TABS
25.0000 mg | ORAL_TABLET | Freq: Four times a day (QID) | ORAL | Status: DC | PRN
  Administered 2013-11-23: 25 mg via ORAL
  Filled 2013-11-23 (×3): qty 1

## 2013-11-23 MED ORDER — IPRATROPIUM-ALBUTEROL 0.5-2.5 (3) MG/3ML IN SOLN
3.0000 mL | Freq: Once | RESPIRATORY_TRACT | Status: AC
  Administered 2013-11-23: 3 mL via RESPIRATORY_TRACT
  Filled 2013-11-23: qty 3

## 2013-11-23 MED ORDER — BUDESONIDE 0.25 MG/2ML IN SUSP
0.5000 mg | Freq: Two times a day (BID) | RESPIRATORY_TRACT | Status: DC
  Administered 2013-11-23: 0.5 mg via RESPIRATORY_TRACT
  Filled 2013-11-23: qty 4

## 2013-11-23 MED ORDER — VANCOMYCIN HCL 10 G IV SOLR
1500.0000 mg | Freq: Once | INTRAVENOUS | Status: AC
  Administered 2013-11-23: 1500 mg via INTRAVENOUS
  Filled 2013-11-23: qty 1500

## 2013-11-23 MED ORDER — SODIUM CHLORIDE 0.9 % IV SOLN: 250.0000 mL | INTRAVENOUS | Status: DC | PRN

## 2013-11-23 MED ORDER — METHYLPREDNISOLONE SODIUM SUCC 125 MG IJ SOLR
60.0000 mg | Freq: Two times a day (BID) | INTRAMUSCULAR | Status: DC
  Administered 2013-11-23 – 2013-11-24 (×2): 60 mg via INTRAVENOUS
  Filled 2013-11-23 (×4): qty 0.96

## 2013-11-23 MED ORDER — ALBUTEROL SULFATE (2.5 MG/3ML) 0.083% IN NEBU
2.5000 mg | INHALATION_SOLUTION | RESPIRATORY_TRACT | Status: DC
  Administered 2013-11-23 – 2013-11-26 (×15): 2.5 mg via RESPIRATORY_TRACT
  Filled 2013-11-23 (×17): qty 3

## 2013-11-23 MED ORDER — PREGABALIN 100 MG PO CAPS: 200.0000 mg | ORAL_CAPSULE | Freq: Two times a day (BID) | ORAL | Status: DC | PRN

## 2013-11-23 MED ORDER — ALBUTEROL SULFATE (2.5 MG/3ML) 0.083% IN NEBU
2.5000 mg | INHALATION_SOLUTION | RESPIRATORY_TRACT | Status: DC | PRN
Start: 2013-11-23 — End: 2013-11-28
  Administered 2013-11-26: 2.5 mg via RESPIRATORY_TRACT
  Filled 2013-11-23: qty 3

## 2013-11-23 MED ORDER — BUDESONIDE 0.5 MG/2ML IN SUSP
0.5000 mg | Freq: Two times a day (BID) | RESPIRATORY_TRACT | Status: DC
  Administered 2013-11-24 – 2013-11-27 (×7): 0.5 mg via RESPIRATORY_TRACT
  Filled 2013-11-23 (×7): qty 2

## 2013-11-23 MED ORDER — PIPERACILLIN-TAZOBACTAM 3.375 G IVPB
3.3750 g | Freq: Three times a day (TID) | INTRAVENOUS | Status: DC
  Administered 2013-11-23 – 2013-11-27 (×12): 3.375 g via INTRAVENOUS
  Filled 2013-11-23 (×13): qty 50

## 2013-11-23 MED ORDER — GUAIFENESIN ER 600 MG PO TB12
1200.0000 mg | ORAL_TABLET | Freq: Two times a day (BID) | ORAL | Status: DC | PRN
  Administered 2013-11-23 – 2013-11-25 (×3): 1200 mg via ORAL
  Filled 2013-11-23 (×4): qty 2

## 2013-11-23 MED ORDER — PREDNISONE 20 MG PO TABS
60.0000 mg | ORAL_TABLET | Freq: Once | ORAL | Status: AC
  Administered 2013-11-23: 60 mg via ORAL

## 2013-11-23 NOTE — ED Provider Notes (Signed)
CSN: [id number]     Arrival date & time 11/23/13  1330 History   First MD Initiated Contact with Patient 11/23/13 1355     Chief Complaint  Patient presents with  . Shortness of Breath     (Consider location/radiation/quality/duration/timing/severity/associated sxs/prior Treatment) HPI  [name] is a 48 y.o. male with PMH of Sarcoidosis, hypertension, OSA, COPD, chronic respiratory failure presenting with worsening shortness of breath with ambulation. This started last night when he stood up to go to the kitchen. Resolved with rest. Patient also endorses subjective fevers and chills. He notes increase in his sputum production which is yellow and not thicker than normal. Increased in home O2 requirements. Normally 2L now 3L. Patient followed by pulmonologist Dr. Levy Pupa. CT scan October showed stable sarcoidosis. Patient states they were discussing chronic immunosuppression or alternating steroids and antibiotics. Patient states last COPD exacerbation was one month ago and he was treated with antibiotics and steroids. Patient denies chest pain, back pain, nausea, vomiting, diarrhea, abdominal pain.   Past Medical History  Diagnosis Date  . Hypertension   . Sarcoidosis   . OSA on CPAP   . Chronic respiratory failure     home oxygen PRN  . Bronchitis   . Pneumonia   . COPD (chronic obstructive pulmonary disease)   . Neuromuscular disorder    Past Surgical History  Procedure Laterality Date  . Rotator cuff repair    . Arm surgery     . Video assisted thoracoscopy (vats)/thorocotomy Left 07/17/2012    Procedure: VIDEO ASSISTED THORACOSCOPY (VATS)/BLEB Stapling;  Surgeon: Alleen Borne, MD;  Location: [facility] OR;  Service: Thoracic;  Laterality: Left;   Family History  Problem Relation Age of Onset  . Diabetes Father    History  Substance Use Topics  . Smoking status: Never Smoker   . Smokeless tobacco: Never Used  . Alcohol Use: Yes     Comment: social    Review of Systems   Constitutional: Positive for fever and chills.  HENT: Negative for congestion and rhinorrhea.   Eyes: Negative for visual disturbance.  Respiratory: Positive for cough and shortness of breath.   Cardiovascular: Negative for chest pain and palpitations.  Gastrointestinal: Negative for nausea, vomiting and diarrhea.  Genitourinary: Negative for dysuria and hematuria.  Musculoskeletal: Negative for back pain and gait problem.  Skin: Negative for rash.  Neurological: Negative for weakness and headaches.      Allergies  Clindamycin/lincomycin  Home Medications   Prior to Admission medications   Medication Sig Start Date End Date Taking? Authorizing Provider  albuterol (PROAIR HFA) 108 (90 BASE) MCG/ACT inhaler Inhale 2 puffs into the lungs every 6 (six) hours as needed for wheezing or shortness of breath. 03/28/13  Yes Leslye Peer, MD  albuterol (PROVENTIL) (2.5 MG/3ML) 0.083% nebulizer solution Take 2.5 mg by nebulization every 4 (four) hours as needed for wheezing or shortness of breath.  06/08/12  Yes Historical Provider, MD  budesonide-formoterol (SYMBICORT) 160-4.5 MCG/ACT inhaler Inhale 2 puffs into the lungs 2 (two) times daily. 03/28/13  Yes Leslye Peer, MD  calcium-vitamin D (OSCAL WITH D) 500-200 MG-UNIT per tablet Take 1 tablet by mouth every morning.   Yes Historical Provider, MD  cholecalciferol (VITAMIN D) 1000 UNITS tablet Take 1,000 Units by mouth every morning.    Yes Historical Provider, MD  cyclobenzaprine (FLEXERIL) 10 MG tablet Take 10 mg by mouth 3 (three) times daily as needed for muscle spasms.    Yes Historical Provider,  MD  diphenhydrAMINE (BENADRYL) 25 MG tablet Take 25 mg by mouth every 6 (six) hours as needed for allergies.    Yes Historical Provider, MD  famotidine (PEPCID) 20 MG tablet Take 1 tablet (20 mg total) by mouth 2 (two) times daily. 03/08/12  Yes Renae FickleMackenzie Short, MD  ferrous sulfate 325 (65 FE) MG tablet Take 1 tablet (325 mg total) by mouth 2  (two) times daily with a meal. 06/18/12  Yes Leroy SeaPrashant K Singh, MD  guaiFENesin (MUCINEX) 600 MG 12 hr tablet Take 1,200 mg by mouth 2 (two) times daily as needed (For congestion.).    Yes Historical Provider, MD  Influenza vac split quadrivalent PF (FLUARIX) 0.5 ML injection Inject 0.5 mLs into the muscle once.   Yes Historical Provider, MD  lidocaine (LIDODERM) 5 % Place 1 patch onto the skin daily as needed (For pain.).    Yes Historical Provider, MD  metoprolol (LOPRESSOR) 50 MG tablet Take 1 tablet (50 mg total) by mouth 2 (two) times daily. 01/02/13  Yes Leslye Peerobert S Byrum, MD  Multiple Vitamins-Minerals (MULTIVITAMIN WITH MINERALS) tablet Take 1 tablet by mouth every morning.    Yes Historical Provider, MD  pregabalin (LYRICA) 200 MG capsule Take 200 mg by mouth 2 (two) times daily as needed (For pain.).    Yes Historical Provider, MD  promethazine (PHENERGAN) 25 MG tablet Take 1 tablet (25 mg total) by mouth every 6 (six) hours as needed for Nausea. 11/02/11  Yes Historical Provider, MD  traMADol (ULTRAM) 50 MG tablet Take 1 tablet (50 mg total) by mouth every 6 (six) hours as needed. 09/05/13  Yes Heather Laisure, PA-C   BP 137/98 mmHg  Pulse 101  Temp(Src) 97.9 F (36.6 C) (Oral)  Resp 18  Ht 5\' 7"  (1.702 m)  Wt 172 lb (78.019 kg)  BMI 26.93 kg/m2  SpO2 94% Physical Exam  Constitutional: He appears well-developed and well-nourished. No distress.  HENT:  Head: Normocephalic and atraumatic.  Eyes: Conjunctivae and EOM are normal. Right eye exhibits no discharge. Left eye exhibits no discharge.  Neck: No JVD present.  Cardiovascular: Normal rate, regular rhythm and normal heart sounds.   No leg swelling or tenderness.   Pulmonary/Chest:  Diminished breath sounds equal bilaterally. Patient in moderate respiratory distress with accessory muscle use.   Abdominal: Soft. Bowel sounds are normal. He exhibits no distension. There is no tenderness.  Neurological: He is alert. He exhibits normal  muscle tone. Coordination normal.  Skin: Skin is warm and dry. He is not diaphoretic.  Nursing note and vitals reviewed.   ED Course  Procedures (including critical care time) Labs Review Labs Reviewed  BASIC METABOLIC PANEL - Abnormal; Notable for the following:    Glucose, Bld 157 (*)    GFR calc non Af Amer 79 (*)    All other components within normal limits  CBC - Abnormal; Notable for the following:    Hemoglobin 11.8 (*)    HCT 35.6 (*)    All other components within normal limits  PRO B NATRIURETIC PEPTIDE  I-STAT TROPOININ, ED    Imaging Review Dg Chest 2 View (if Patient Has Fever And/or Copd)  11/23/2013   CLINICAL DATA:  Shortness of breath starting yesterday. History of spontaneous pneumothorax last year. History of sarcoidosis.  EXAM: CHEST  2 VIEW  COMPARISON:  CT chest 10/19/2013 and chest radiographs 08/22/2013 in 02/2016 2015  FINDINGS: Patient has extensive perihilar parenchymal opacities hand extensive bilateral parenchymal architectural distortion. These findings are  chronic and appear similar to the scout view for chest CT dated 10/19/2013 and chest radiographs dated 08/22/2013 and 02/18/2013. Best seen on the lateral view is a chronic large bulla in the right upper lobe apex. Bilateral interstitial prominence is stable. There is thickening of the pleura at the lung bases bilaterally, also chronic. No visible pneumothorax. Heart and mediastinal contours are stable. No acute osseous abnormality is seen.  IMPRESSION: Stable marked chronic changes in the lungs secondary to known history of sarcoidosis. No definite acute superimposed abnormality is identified compared to patient's most recent comparison studies.   Electronically Signed   By: Britta MccreedySusan  Turner M.D.   On: 11/23/2013 14:35     EKG Interpretation None     Meds given in ED:  Medications  ipratropium-albuterol (DUONEB) 0.5-2.5 (3) MG/3ML nebulizer solution 3 mL (3 mLs Nebulization Given 11/23/13 1437)   predniSONE (DELTASONE) tablet 60 mg (60 mg Oral Given 11/23/13 1501)  ipratropium-albuterol (DUONEB) 0.5-2.5 (3) MG/3ML nebulizer solution 3 mL (3 mLs Nebulization Given 11/23/13 1614)    New Prescriptions   No medications on file      MDM   Final diagnoses:  COPD exacerbation   Pt with history of COPD and sarcoidosis with recent COPD exacerbation one month ago treated with abx and steroids. Pt presenting with worsening SOB, increase in sputum production and increased O2 requirements at home from 2L baseline to 3L. VSS. Lungs with diminished lung sounds bilaterally with moderate respiratory distress. Labs noncontributory. CXR stable. Pt given prednisone and 2 nebulizer treatments without significant improvement. Consult to pulmonary/ critical care with plan for admission.   Discussed return precautions with patient. Discussed all results and patient verbalizes understanding and agrees with plan.  This is a shared patient. This patient was discussed with the physician, Dr. Jodi MourningZavitz who saw and evaluated the patient and agrees with the plan.     Louann SjogrenVictoria L Ruxin Ransome, PA-C 11/23/13 [id number]  Louann SjogrenVictoria L Semya Klinke, PA-C 11/23/13 2103  Enid SkeensJoshua M Zavitz, MD 11/27/13 [phone]

## 2013-11-23 NOTE — ED Notes (Signed)
Kyra SearlesJackie Payler, [phone] call if need anything.

## 2013-11-23 NOTE — ED Notes (Signed)
Transported by Janna ArchLauren M RN

## 2013-11-23 NOTE — ED Notes (Signed)
Pt c/o cant catch his breath, similar to when he had a collapsed lunch. Pt is on continuous oxygen of 2L but now 3-4L.Marland Kitchen.  Pt wheezing in triage and states that he has been taking inhalers and treatments at home but arent helping.

## 2013-11-23 NOTE — ED Notes (Signed)
MD at bedside. Admitting MD PULM PRESENT TO EVALUATE THIS PT

## 2013-11-23 NOTE — H&P (Signed)
PULMONARY / CRITICAL CARE MEDICINE   Name: [name] MRN: [id number] DOB: 05/23/1965    ADMISSION DATE:  11/23/2013 CONSULTATION DATE:  11/23/2013  REFERRING MD :  Jodi MourningZavitz [name]  CHIEF COMPLAINT:  Shortness of breath  INITIAL PRESENTATION: 48 y/o male with sarcoid and bronchiectasis was [facility]13 with acute hypoxemic respiratory failure presumably from a flare of bronchiectasis and sarcoid.  STUDIES:    SIGNIFICANT EVENTS: 11/13 admission   HISTORY OF PRESENT ILLNESS:  This is a very pleasant 48 year old male with a past medical history significant for sarcoidosis with bronchiectasis who has frequent asthmatic type flares who [facility] on 11/23/2013 complaining of shortness of breath. He states that he had been feeling in his usual state of health until about 1 week ago. He started having increasing cough with mucus production. Then on 11/22/2013 in the evening he suddenly became more short of breath than baseline. This is been associated with mild chest tightness as well as wheezing. In the last several days he has experienced increasing sinus congestion, a mild headache. He has had some chills but no fever. He also noted some mild chest tightness on the left side of his chest. He has not had swelling, diaphoresis. He denies nausea vomiting or diarrhea. No dysuria. In the emergency department he was given nebulizers and steroids. He also states that at home he has increased his home dose of oxygen from 2 L/m to 3.  PAST MEDICAL HISTORY :   has a past medical history of Hypertension; Sarcoidosis; OSA on CPAP; Chronic respiratory failure; Bronchitis; Pneumonia; COPD (chronic obstructive pulmonary disease); and Neuromuscular disorder.  has past surgical history that includes Rotator cuff repair; arm surgery ; and Video assisted thoracoscopy (vats)/thorocotomy (Left, 07/17/2012). Prior to Admission medications   Medication Sig Start  Date End Date Taking? Authorizing Provider  albuterol (PROAIR HFA) 108 (90 BASE) MCG/ACT inhaler Inhale 2 puffs into the lungs every 6 (six) hours as needed for wheezing or shortness of breath. 03/28/13  Yes Leslye Peerobert S Byrum, MD  albuterol (PROVENTIL) (2.5 MG/3ML) 0.083% nebulizer solution Take 2.5 mg by nebulization every 4 (four) hours as needed for wheezing or shortness of breath.  06/08/12  Yes Historical Provider, MD  budesonide-formoterol (SYMBICORT) 160-4.5 MCG/ACT inhaler Inhale 2 puffs into the lungs 2 (two) times daily. 03/28/13  Yes Leslye Peerobert S Byrum, MD  calcium-vitamin D (OSCAL WITH D) 500-200 MG-UNIT per tablet Take 1 tablet by mouth every morning.   Yes Historical Provider, MD  cholecalciferol (VITAMIN D) 1000 UNITS tablet Take 1,000 Units by mouth every morning.    Yes Historical Provider, MD  cyclobenzaprine (FLEXERIL) 10 MG tablet Take 10 mg by mouth 3 (three) times daily as needed for muscle spasms.    Yes Historical Provider, MD  diphenhydrAMINE (BENADRYL) 25 MG tablet Take 25 mg by mouth every 6 (six) hours as needed for allergies.    Yes Historical Provider, MD  famotidine (PEPCID) 20 MG tablet Take 1 tablet (20 mg total) by mouth 2 (two) times daily. 03/08/12  Yes Renae FickleMackenzie Short, MD  ferrous sulfate 325 (65 FE) MG tablet Take 1 tablet (325 mg total) by mouth 2 (two) times daily with a meal. 06/18/12  Yes Leroy SeaPrashant K Singh, MD  guaiFENesin (MUCINEX) 600 MG 12 hr tablet Take 1,200 mg by mouth 2 (two) times daily as needed (For congestion.).    Yes Historical Provider, MD  Influenza vac split quadrivalent PF (FLUARIX) 0.5 ML injection  Inject 0.5 mLs into the muscle once.   Yes Historical Provider, MD  lidocaine (LIDODERM) 5 % Place 1 patch onto the skin daily as needed (For pain.).    Yes Historical Provider, MD  metoprolol (LOPRESSOR) 50 MG tablet Take 1 tablet (50 mg total) by mouth 2 (two) times daily. 01/02/13  Yes Leslye Peerobert S Byrum, MD  Multiple Vitamins-Minerals (MULTIVITAMIN WITH MINERALS)  tablet Take 1 tablet by mouth every morning.    Yes Historical Provider, MD  pregabalin (LYRICA) 200 MG capsule Take 200 mg by mouth 2 (two) times daily as needed (For pain.).    Yes Historical Provider, MD  promethazine (PHENERGAN) 25 MG tablet Take 1 tablet (25 mg total) by mouth every 6 (six) hours as needed for Nausea. 11/02/11  Yes Historical Provider, MD  traMADol (ULTRAM) 50 MG tablet Take 1 tablet (50 mg total) by mouth every 6 (six) hours as needed. 09/05/13  Yes Santiago GladHeather Laisure, PA-C   Allergies  Allergen Reactions  . Clindamycin/Lincomycin Anaphylaxis and Swelling    Made face and tongue swell    FAMILY HISTORY:  indicated that his mother is alive. He indicated that his father is deceased. He indicated that his sister is alive. He indicated that all of his four brothers are alive.  SOCIAL HISTORY:  reports that he has never smoked. He has never used smokeless tobacco. He reports that he drinks alcohol. He reports that he does not use illicit drugs.  REVIEW OF SYSTEMS:   Gen: Denies fever, + chills, weight change, fatigue, night sweats HEENT: Denies blurred vision, double vision, hearing loss, tinnitus, sinus congestion, rhinorrhea, sore throat, neck stiffness, dysphagia PULM: PER HPI CV: Notes mild chest pain, denies edema, orthopnea, paroxysmal nocturnal dyspnea, palpitations GI: Denies abdominal pain, nausea, vomiting, diarrhea, hematochezia, melena, constipation, change in bowel habits GU: Denies dysuria, hematuria, polyuria, oliguria, urethral discharge Endocrine: Denies hot or cold intolerance, polyuria, polyphagia or appetite change Derm: Denies rash, dry skin, scaling or peeling skin change Heme: Denies easy bruising, bleeding, bleeding gums Neuro: Denies headache, numbness, weakness, slurred speech, loss of memory or consciousness   SUBJECTIVE:   VITAL SIGNS: Temp:  [97.9 F (36.6 C)] 97.9 F (36.6 C) (11/13 1330) Pulse Rate:  [101-111] 101 (11/13 1440) Resp:   [18-24] 18 (11/13 1440) BP: (137)/(98) 137/98 mmHg (11/13 1330) SpO2:  [86 %-100 %] 94 % (11/13 1614) Weight:  [78.019 kg (172 lb)] 78.019 kg (172 lb) (11/13 1440) HEMODYNAMICS:   VENTILATOR SETTINGS:   INTAKE / OUTPUT: No intake or output data in the 24 hours ending 11/23/13 1722  PHYSICAL EXAMINATION: Gen: sitting up in bed, speaking in full sentences HEENT: NCAT, PERRL, EOMi, OP clear, neck supple without masses PULM: Wheezing bilaterally, good air movement CV: Tachy regular, no mgr, no JVD AB: BS+, soft, nontender, no hsm Ext: warm, no edema, no clubbing, no cyanosis Derm: no rash or skin breakdown Neuro: A&Ox4, MAEW    LABS:  CBC  Recent Labs Lab 11/23/13 1336  WBC 4.3  HGB 11.8*  HCT 35.6*  PLT 301   Coag's No results for input(s): APTT, INR in the last 168 hours. BMET  Recent Labs Lab 11/23/13 1336  NA 143  K 4.5  CL 102  CO2 28  BUN 12  CREATININE 1.08  GLUCOSE 157*   Electrolytes  Recent Labs Lab 11/23/13 1336  CALCIUM 9.4   Sepsis Markers No results for input(s): LATICACIDVEN, PROCALCITON, O2SATVEN in the last 168 hours. ABG No results for input(s): PHART,  PCO2ART, PO2ART in the last 168 hours. Liver Enzymes No results for input(s): AST, ALT, ALKPHOS, BILITOT, ALBUMIN in the last 168 hours. Cardiac Enzymes  Recent Labs Lab 11/23/13 1458  PROBNP 97.5   Glucose No results for input(s): GLUCAP in the last 168 hours.  Imaging No results found.  CXR PA 2 view reviewed by me> chronic changes both lungs, large bullae/cavity RUL, no pneumothorax  ASSESSMENT / PLAN:  PULMONARY OETT n/a A: Acute hypoxemic respiratory failure consistent with flare of bronchiectasis and sarcoidosis, similar to previous flares with an asthmatic phenotype Currently stable for admission to the floor Course complicated by medication non-compliance due to inability to afford medications P:   -admit to inpatient -Solu-Medrol 60 mg every 12 hours -Brovana  twice a day -Pulmicort twice a day -Albuterol every 4 hours and every 2 hours when necessary -Monitor O2 sats, goal greater than 90% -O2 as needed  CARDIOVASCULAR A: Sinus tachycardia secondary to respiratory distress, does not take home metoprolol P:  -continue to hold metoprolol for now considering asthmatic flare  RENAL A:  No acute issues P:   -Monitor BMET and UOP -Replace electrolytes as needed  GASTROINTESTINAL A:  No acute issues P:   -regular diet  HEMATOLOGIC A:  No acute issues P:  -Monitor for bleeding  INFECTIOUS A:  Acute exacerbation of bronchiectasis P:   Sputum November 13>  Abx:  -vancomycin, start date November 13, day 1 of 14 -Zosyn, start date November 13, day 1 of 14 -narrow antibiotics as able  ENDOCRINE A:  No acute issues P:   -monitor glucose  NEUROLOGIC A:  Chronic pain P:   -continue home flexeril, pregabalin   FAMILY  - Updates: girlfriend updated at bedside   TODAY'S SUMMARY: Sarcoidosis/bronchiectasis flare causing acute hypoxemic respiratory failure.  Admit to floor, solumedrol, broad spectrum antibiotics, plan to narrow as appropriate.   Heber [place], MD [facility] Pager: [phone] Cell: [phone] If no response, call [phone]   11/23/2013, 5:22 PM

## 2013-11-23 NOTE — Progress Notes (Signed)
ANTIBIOTIC CONSULT NOTE - INITIAL  Pharmacy Consult for Vancomycin & Zosyn Indication: pneumonia  Allergies  Allergen Reactions  . Clindamycin/Lincomycin Anaphylaxis and Swelling    Made face and tongue swell   Patient Measurements: Height: 5\' 7"  (170.2 cm) Weight: 172 lb (78.019 kg) IBW/kg (Calculated) : 66.1  Vital Signs: Temp: 98.3 F (36.8 C) (11/13 1836) Temp Source: Oral (11/13 1836) BP: 154/93 mmHg (11/13 1836) Pulse Rate: 110 (11/13 1836) Intake/Output from previous day:   Intake/Output from this shift:    Labs:  Recent Labs  11/23/13 1336  WBC 4.3  HGB 11.8*  PLT 301  CREATININE 1.08   Estimated Creatinine Clearance: 77.4 mL/min (by C-G formula based on Cr of 1.08). No results for input(s): VANCOTROUGH, VANCOPEAK, VANCORANDOM, GENTTROUGH, GENTPEAK, GENTRANDOM, TOBRATROUGH, TOBRAPEAK, TOBRARND, AMIKACINPEAK, AMIKACINTROU, AMIKACIN in the last 72 hours.   Microbiology: No results found for this or any previous visit (from the past 720 hour(s)).  Medical History: Past Medical History  Diagnosis Date  . Hypertension   . Sarcoidosis   . OSA on CPAP   . Chronic respiratory failure     home oxygen PRN  . Bronchitis   . Pneumonia   . COPD (chronic obstructive pulmonary disease)   . Neuromuscular disorder    Medications:  Scheduled:  . albuterol  2.5 mg Nebulization Q4H  . arformoterol  15 mcg Nebulization BID  . budesonide  0.5 mg Nebulization BID  . enoxaparin (LOVENOX) injection  40 mg Subcutaneous Q24H  . famotidine  20 mg Oral BID  . methylPREDNISolone (SOLU-MEDROL) injection  60 mg Intravenous Q12H  . piperacillin-tazobactam (ZOSYN)  IV  3.375 g Intravenous Q8H  . vancomycin  1,500 mg Intravenous Once  . [START ON 11/24/2013] vancomycin  1,000 mg Intravenous Q8H   Anti-infectives    Start     Dose/Rate Route Frequency Ordered Stop   11/24/13 0600  vancomycin (VANCOCIN) IVPB 1000 mg/200 mL premix     1,000 mg200 mL/hr over 60 Minutes  Intravenous Every 8 hours 11/23/13 1913     11/23/13 2100  vancomycin (VANCOCIN) 1,500 mg in sodium chloride 0.9 % 500 mL IVPB     1,500 mg250 mL/hr over 120 Minutes Intravenous  Once 11/23/13 1912     11/23/13 2000  piperacillin-tazobactam (ZOSYN) IVPB 3.375 g     3.375 g12.5 mL/hr over 240 Minutes Intravenous Every 8 hours 11/23/13 1905       Assessment: 48 yoM with hx of Sarcoid, admit for exacerbation, treat for HCAP. Pharmacy to dose Vancomycin and Zosyn  Previous Vanc dosing 07/2012 of 1gm q8hr resulted in trough of 19 mcg/ml  Goal of Therapy:  Vancomycin trough level 15-20 mcg/ml  Plan:   Vancomycin 1500mg  x1, then 1gm q8hr  Zosyn 3.375gm q8h - 4 hr infusion  Check Vanc trough at steady state  Otho BellowsGreen, Yamilet Mcfayden L PharmD Pager [phone] 11/23/2013, 7:19 PM

## 2013-11-24 DIAGNOSIS — J9602 Acute respiratory failure with hypercapnia: Secondary | ICD-10-CM | POA: Insufficient documentation

## 2013-11-24 DIAGNOSIS — R0602 Shortness of breath: Secondary | ICD-10-CM

## 2013-11-24 DIAGNOSIS — J441 Chronic obstructive pulmonary disease with (acute) exacerbation: Secondary | ICD-10-CM

## 2013-11-24 DIAGNOSIS — E872 Acidosis: Secondary | ICD-10-CM

## 2013-11-24 LAB — BLOOD GAS, ARTERIAL
ACID-BASE DEFICIT: 0.9 mmol/L (ref 0.0–2.0)
Bicarbonate: 24.2 mEq/L — ABNORMAL HIGH (ref 20.0–24.0)
DELIVERY SYSTEMS: POSITIVE
Drawn by: 318141
Expiratory PAP: 5
FIO2: 1 %
Inspiratory PAP: 10
Mode: POSITIVE
PATIENT TEMPERATURE: 97.9
PCO2 ART: 43.1 mmHg (ref 35.0–45.0)
PO2 ART: 524 mmHg — AB (ref 80.0–100.0)
Pressure support: 5 cmH2O
RATE: 10 resp/min
pH, Arterial: 7.365 (ref 7.350–7.450)

## 2013-11-24 LAB — BASIC METABOLIC PANEL
ANION GAP: 14 (ref 5–15)
BUN: 13 mg/dL (ref 6–23)
CHLORIDE: 98 meq/L (ref 96–112)
CO2: 26 mEq/L (ref 19–32)
CREATININE: 1.04 mg/dL (ref 0.50–1.35)
Calcium: 9.4 mg/dL (ref 8.4–10.5)
GFR calc non Af Amer: 83 mL/min — ABNORMAL LOW (ref >90)
Glucose, Bld: 220 mg/dL — ABNORMAL HIGH (ref 70–99)
POTASSIUM: 4.2 meq/L (ref 3.7–5.3)
Sodium: 138 mEq/L (ref 137–147)

## 2013-11-24 LAB — EXPECTORATED SPUTUM ASSESSMENT W GRAM STAIN, RFLX TO RESP C

## 2013-11-24 LAB — CBC
HCT: 35.1 % — ABNORMAL LOW (ref 39.0–52.0)
Hemoglobin: 11.4 g/dL — ABNORMAL LOW (ref 13.0–17.0)
MCH: 26.8 pg (ref 26.0–34.0)
MCHC: 32.5 g/dL (ref 30.0–36.0)
MCV: 82.6 fL (ref 78.0–100.0)
PLATELETS: 287 10*3/uL (ref 150–400)
RBC: 4.25 MIL/uL (ref 4.22–5.81)
RDW: 12.7 % (ref 11.5–15.5)
WBC: 4.1 10*3/uL (ref 4.0–10.5)

## 2013-11-24 LAB — GLUCOSE, CAPILLARY
GLUCOSE-CAPILLARY: 137 mg/dL — AB (ref 70–99)
GLUCOSE-CAPILLARY: 208 mg/dL — AB (ref 70–99)
Glucose-Capillary: 135 mg/dL — ABNORMAL HIGH (ref 70–99)
Glucose-Capillary: 161 mg/dL — ABNORMAL HIGH (ref 70–99)

## 2013-11-24 LAB — EXPECTORATED SPUTUM ASSESSMENT W REFEX TO RESP CULTURE: SPECIAL REQUESTS: NORMAL

## 2013-11-24 LAB — STREP PNEUMONIAE URINARY ANTIGEN: STREP PNEUMO URINARY ANTIGEN: NEGATIVE

## 2013-11-24 MED ORDER — SALINE SPRAY 0.65 % NA SOLN
1.0000 | NASAL | Status: DC | PRN
  Administered 2013-11-26 (×2): 1 via NASAL
  Filled 2013-11-24: qty 44

## 2013-11-24 MED ORDER — INSULIN ASPART 100 UNIT/ML ~~LOC~~ SOLN
0.0000 [IU] | SUBCUTANEOUS | Status: DC
  Administered 2013-11-24: 1 [IU] via SUBCUTANEOUS
  Administered 2013-11-24: 2 [IU] via SUBCUTANEOUS
  Administered 2013-11-24: 3 [IU] via SUBCUTANEOUS
  Administered 2013-11-24: 1 [IU] via SUBCUTANEOUS
  Administered 2013-11-25: 3 [IU] via SUBCUTANEOUS
  Administered 2013-11-25 (×4): 2 [IU] via SUBCUTANEOUS
  Administered 2013-11-25: 1 [IU] via SUBCUTANEOUS
  Administered 2013-11-26: 2 [IU] via SUBCUTANEOUS
  Administered 2013-11-26: 3 [IU] via SUBCUTANEOUS
  Administered 2013-11-26: 1 [IU] via SUBCUTANEOUS

## 2013-11-24 MED ORDER — GUAIFENESIN-DM 100-10 MG/5ML PO SYRP
5.0000 mL | ORAL_SOLUTION | ORAL | Status: DC | PRN
  Administered 2013-11-24 – 2013-11-27 (×5): 5 mL via ORAL
  Filled 2013-11-24 (×5): qty 10

## 2013-11-24 MED ORDER — LEVOFLOXACIN IN D5W 750 MG/150ML IV SOLN
750.0000 mg | INTRAVENOUS | Status: DC
  Administered 2013-11-24 – 2013-11-28 (×5): 750 mg via INTRAVENOUS
  Filled 2013-11-24 (×6): qty 150

## 2013-11-24 MED ORDER — METHYLPREDNISOLONE SODIUM SUCC 125 MG IJ SOLR
80.0000 mg | Freq: Three times a day (TID) | INTRAMUSCULAR | Status: DC
  Administered 2013-11-24 – 2013-11-26 (×6): 80 mg via INTRAVENOUS
  Filled 2013-11-24 (×4): qty 2

## 2013-11-24 MED ORDER — METOPROLOL TARTRATE 25 MG PO TABS
25.0000 mg | ORAL_TABLET | Freq: Two times a day (BID) | ORAL | Status: DC
  Administered 2013-11-24 – 2013-11-28 (×9): 25 mg via ORAL
  Filled 2013-11-24 (×10): qty 1

## 2013-11-24 MED ORDER — FUROSEMIDE 10 MG/ML IJ SOLN
20.0000 mg | Freq: Two times a day (BID) | INTRAMUSCULAR | Status: AC
  Administered 2013-11-24 (×2): 20 mg via INTRAVENOUS
  Filled 2013-11-24 (×2): qty 2

## 2013-11-24 MED ORDER — DILTIAZEM HCL 30 MG PO TABS
30.0000 mg | ORAL_TABLET | Freq: Three times a day (TID) | ORAL | Status: DC
  Administered 2013-11-24 – 2013-11-28 (×13): 30 mg via ORAL
  Filled 2013-11-24 (×17): qty 1

## 2013-11-24 NOTE — H&P (Signed)
PULMONARY / CRITICAL CARE MEDICINE   Name: [name] MRN: [id number] DOB: 09/07/1965    ADMISSION DATE:  11/23/2013 CONSULTATION DATE:  11/23/2013  REFERRING MD :  Jodi MourningZavitz [name]  CHIEF COMPLAINT:  Shortness of breath  INITIAL PRESENTATION: 48 y/o male with sarcoid and bronchiectasis was [facility]13 with acute hypoxemic respiratory failure presumably from a flare of bronchiectasis and sarcoid.  STUDIES:  10/9 CT>>>Mediastinal, hilar and pulmonary parenchymal changes of sarcoid.<BR>Pulmonary parenchymal disease appears mildly progressive from<BR>07/20/202. Mild marginal irregularity of the liver suggests cirrhosis   SIGNIFICANT EVENTS: 11/13 admission 11/14- declined with NIMV needed now  SUBJECTIVE: distress moderate, awake  VITAL SIGNS: Temp:  [97.1 F (36.2 C)-98.3 F (36.8 C)] 97.9 F (36.6 C) (11/14 0550) Pulse Rate:  [101-134] 134 (11/14 0649) Resp:  [18-24] 24 (11/14 0649) BP: (137-193)/(82-106) 193/101 mmHg (11/14 0649) SpO2:  [86 %-100 %] 100 % (11/14 0649) FiO2 (%):  [100 %] 100 % (11/14 0649) Weight:  [78.019 kg (172 lb)] 78.019 kg (172 lb) (11/13 1440) HEMODYNAMICS:   VENTILATOR SETTINGS: Vent Mode:  [-]  FiO2 (%):  [100 %] 100 % INTAKE / OUTPUT: No intake or output data in the 24 hours ending 11/24/13 0735  PHYSICAL EXAMINATION: Gen: sitting up in bed, speaking well but rr now 30 HEENT: jvd up PULM: Wheezing bilaterally, reduced air movement, more course CV: Tachy regular, no mgr, no JVD AB: BS+, soft, nontender, no hsm Ext: warm, no edema Derm: no rash or skin breakdown Neuro: A&Ox4, nonfocal    LABS:  CBC  Recent Labs Lab 11/23/13 1336 11/24/13 0530  WBC 4.3 4.1  HGB 11.8* 11.4*  HCT 35.6* 35.1*  PLT 301 287   Coag's No results for input(s): APTT, INR in the last 168 hours. BMET  Recent Labs Lab 11/23/13 1336 11/24/13 0530  NA 143 138  K 4.5 4.2  CL 102 98  CO2 28 26  BUN 12 13  CREATININE 1.08 1.04  GLUCOSE  157* 220*   Electrolytes  Recent Labs Lab 11/23/13 1336 11/24/13 0530  CALCIUM 9.4 9.4   Sepsis Markers No results for input(s): LATICACIDVEN, PROCALCITON, O2SATVEN in the last 168 hours. ABG  Recent Labs Lab 11/24/13 0704  PHART 7.365  PCO2ART 43.1  PO2ART 524.0*   Liver Enzymes No results for input(s): AST, ALT, ALKPHOS, BILITOT, ALBUMIN in the last 168 hours. Cardiac Enzymes  Recent Labs Lab 11/23/13 1458  PROBNP 97.5   Glucose No results for input(s): GLUCAP in the last 168 hours.  Imaging Dg Chest 2 View (if Patient Has Fever And/or Copd)  11/23/2013   CLINICAL DATA:  Shortness of breath starting yesterday. History of spontaneous pneumothorax last year. History of sarcoidosis.  EXAM: CHEST  2 VIEW  COMPARISON:  CT chest 10/19/2013 and chest radiographs 08/22/2013 in 02/2016 2015  FINDINGS: Patient has extensive perihilar parenchymal opacities hand extensive bilateral parenchymal architectural distortion. These findings are chronic and appear similar to the scout view for chest CT dated 10/19/2013 and chest radiographs dated 08/22/2013 and 02/18/2013. Best seen on the lateral view is a chronic large bulla in the right upper lobe apex. Bilateral interstitial prominence is stable. There is thickening of the pleura at the lung bases bilaterally, also chronic. No visible pneumothorax. Heart and mediastinal contours are stable. No acute osseous abnormality is seen.  IMPRESSION: Stable marked chronic changes in the lungs secondary to known history of sarcoidosis. No definite acute superimposed abnormality is identified compared to patient's most recent comparison studies.  Electronically Signed   By: Britta MccreedySusan  Turner M.D.   On: 11/23/2013 14:35    CXR>>>chronic changes, likely more alveolar process rt greater left  ASSESSMENT / PLAN:  PULMONARY OETT n/a A: Acute hypoxemic respiratory failure consistent with flare of bronchiectasis and sarcoidosis, similar to previous flares  with an asthmatic phenotype Course complicated by medication non-compliance due to inability to afford medications R/o PNA on top P:   -now in icu, worse status, will start NIMV mandatory schedule 4 hr on 1 hr off , re assess in 24 hr -abg required, NPo, may need ett -ABG reviewed, reduce O2 to 40%,  Main issue is wob  -follow pcxr in am  -consider lasix to even to slight neg balance -Solu-Medrol 60 mg every 12 hours, consider escalation  -Brovana twice a day -Pulmicort twice a day -Albuterol every 4 hours and every 2 hours when necessary -Monitor O2 sats, goal greater than 90% -O2 as needed -doppler legs  CARDIOVASCULAR A: Sinus tachycardia secondary to respiratory distress, does not take home metoprolol P:  -continue to hold metoprolol for now considering asthmatic flare -dc hydral which can cause reflex tachy -treat resp failure -consider addition calcium channel blocker  RENAL A:  No acute issues P:   -Monitor BMET in am  -lasix  X 2 doses -chem in am  -kvo  GASTROINTESTINAL A:  distress P:   -regular diet dc -NPO as distress  HEMATOLOGIC A:  DVt prevention P:  -Monitor for bleeding -doppler legs -addition sub q hep on hold with some report blood tinged secretions  INFECTIOUS A:  Acute exacerbation of bronchiectasis, r/o PNA P:   Sputum November 13>  Abx:  -vancomycin 11/13>>> -Zosyn 11/13>>> -levofloxcacin 11/14>>>  -worse status, add atypical levo  ENDOCRINE A:  No acute issues P:   -monitor glucose as on steroids and increased  NEUROLOGIC A:  Chronic pain P:   -continue home flexeril, pregabalin -on NIMV, may need fent   FAMILY  - Updates: patient updated 11/14   TODAY'S SUMMARY: Sarcoidosis/bronchiectasis flare causing acute hypoxemic respiratory failure.  Worsening clinical status nwo on NIMV. Increase steroids, add levo, lasix, NPO  Ccm time 30 min   Mcarthur Rossettianiel J. Tyson [name], MD, FACP Pgr: [phone] [facility]

## 2013-11-24 NOTE — Progress Notes (Signed)
Called to patient room for by RN for complaints of distress. Tachycardia and tachypnea noted with abdominal respirations. Auscultation revealed minimal air movement. [facility] Orders received for transfer to ICU/SD, place BiPAP and obtain ABG. Ventolin 2.5mg  administered via inhalation. BBS remain diminished, with some wheezing post BD.

## 2013-11-25 ENCOUNTER — Inpatient Hospital Stay (HOSPITAL_COMMUNITY): Payer: 59

## 2013-11-25 LAB — GLUCOSE, CAPILLARY
GLUCOSE-CAPILLARY: 149 mg/dL — AB (ref 70–99)
GLUCOSE-CAPILLARY: 192 mg/dL — AB (ref 70–99)
GLUCOSE-CAPILLARY: 197 mg/dL — AB (ref 70–99)
Glucose-Capillary: 170 mg/dL — ABNORMAL HIGH (ref 70–99)
Glucose-Capillary: 179 mg/dL — ABNORMAL HIGH (ref 70–99)
Glucose-Capillary: 214 mg/dL — ABNORMAL HIGH (ref 70–99)
Glucose-Capillary: 187 mg/dL — ABNORMAL HIGH (ref 70–99)
Glucose-Capillary: 193 mg/dL — ABNORMAL HIGH (ref 70–99)

## 2013-11-25 LAB — COMPREHENSIVE METABOLIC PANEL
ALBUMIN: 3.5 g/dL (ref 3.5–5.2)
ALK PHOS: 90 U/L (ref 39–117)
ALT: 13 U/L (ref 0–53)
ANION GAP: 13 (ref 5–15)
AST: 22 U/L (ref 0–37)
BUN: 21 mg/dL (ref 6–23)
CO2: 28 mEq/L (ref 19–32)
CREATININE: 1.21 mg/dL (ref 0.50–1.35)
Calcium: 9.8 mg/dL (ref 8.4–10.5)
Chloride: 97 mEq/L (ref 96–112)
GFR calc Af Amer: 80 mL/min — ABNORMAL LOW (ref >90)
GFR calc non Af Amer: 69 mL/min — ABNORMAL LOW (ref >90)
Glucose, Bld: 196 mg/dL — ABNORMAL HIGH (ref 70–99)
POTASSIUM: 4.3 meq/L (ref 3.7–5.3)
Sodium: 138 mEq/L (ref 137–147)
Total Bilirubin: <0.2 mg/dL — ABNORMAL LOW (ref 0.3–1.2)
Total Protein: 7.5 g/dL (ref 6.0–8.3)

## 2013-11-25 LAB — CBC WITH DIFFERENTIAL/PLATELET
Basophils Absolute: 0 10*3/uL (ref 0.0–0.1)
Basophils Relative: 0 % (ref 0–1)
Eosinophils Absolute: 0 10*3/uL (ref 0.0–0.7)
Eosinophils Relative: 0 % (ref 0–5)
HCT: 36.9 % — ABNORMAL LOW (ref 39.0–52.0)
HEMOGLOBIN: 11.8 g/dL — AB (ref 13.0–17.0)
Lymphocytes Relative: 5 % — ABNORMAL LOW (ref 12–46)
Lymphs Abs: 0.8 10*3/uL (ref 0.7–4.0)
MCH: 26.8 pg (ref 26.0–34.0)
MCHC: 32 g/dL (ref 30.0–36.0)
MCV: 83.7 fL (ref 78.0–100.0)
MONOS PCT: 2 % — AB (ref 3–12)
Monocytes Absolute: 0.3 10*3/uL (ref 0.1–1.0)
NEUTROS PCT: 93 % — AB (ref 43–77)
Neutro Abs: 14.7 10*3/uL — ABNORMAL HIGH (ref 1.7–7.7)
Platelets: 325 10*3/uL (ref 150–400)
RBC: 4.41 MIL/uL (ref 4.22–5.81)
RDW: 12.9 % (ref 11.5–15.5)
WBC: 15.8 10*3/uL — ABNORMAL HIGH (ref 4.0–10.5)

## 2013-11-25 LAB — VANCOMYCIN, TROUGH
Vancomycin Tr: 54.6 ug/mL (ref 10.0–20.0)
Vancomycin Tr: 16.9 ug/mL (ref 10.0–20.0)

## 2013-11-25 MED ORDER — FUROSEMIDE 10 MG/ML IJ SOLN: 20.0000 mg | Freq: Once | INTRAMUSCULAR | Status: DC

## 2013-11-25 NOTE — Progress Notes (Signed)
Vanc trough reported as 54.6 but it was drawn shortly after the Vanc dose was hung so is invalid. Will re-draw Vanc trough later today.  Charolotte Ekeom Emoni Yang, PharmD, pager [phone]. 11/25/2013,7:40 AM.

## 2013-11-25 NOTE — H&P (Signed)
PULMONARY / CRITICAL CARE MEDICINE   Name: [name] MRN: [id number] DOB: 04/23/1965    ADMISSION DATE:  11/23/2013 CONSULTATION DATE:  11/23/2013  REFERRING MD :  Jodi MourningZavitz [name]  CHIEF COMPLAINT:  Shortness of breath  INITIAL PRESENTATION: 48 y/o male with sarcoid and bronchiectasis [facility] with acute hypoxemic respiratory failure presumably from a flare of bronchiectasis and sarcoid.  STUDIES:  10/9 CT>>>Mediastinal, hilar and pulmonary parenchymal changes of sarcoid.<BR>Pulmonary parenchymal disease appears mildly progressive from<BR>07/20/202. Mild marginal irregularity of the liver suggests cirrhosis  SIGNIFICANT EVENTS: 11/13 admission 11/14- declined with NIMV needed now 11/15- neg 1.8 liters, improve dpcxr edema on top  SUBJECTIVE: cycled off / on BIPAP  VITAL SIGNS: Temp:  [97.6 F (36.4 C)-98.3 F (36.8 C)] 97.6 F (36.4 C) (11/15 0800) Pulse Rate:  [91-138] 94 (11/15 0752) Resp:  [15-25] 20 (11/15 0752) BP: (116-172)/(69-116) 132/92 mmHg (11/15 0752) SpO2:  [97 %-100 %] 100 % (11/15 0821) FiO2 (%):  [30 %] 30 % (11/14 2333) HEMODYNAMICS:   VENTILATOR SETTINGS: Vent Mode:  [-]  FiO2 (%):  [30 %] 30 % INTAKE / OUTPUT:  Intake/Output Summary (Last 24 hours) at 11/25/13 0924 Last data filed at 11/25/13 [id number]  Gross per 24 hour  Intake   1360 ml  Output   2650 ml  Net  -1290 ml    PHYSICAL EXAMINATION: Gen: sitting up in bed, speaking sentences, reduced rr HEENT: jvd up PULM: Wheezing improved, reduced CV: regular, no mgr, no JVD AB: BS+, soft, nontender, no hsm Ext: warm, no edema Derm: no rash or skin breakdown Neuro: A&Ox4, nonfocal    LABS:  CBC  Recent Labs Lab 11/23/13 1336 11/24/13 0530 11/25/13 0551  WBC 4.3 4.1 15.8*  HGB 11.8* 11.4* 11.8*  HCT 35.6* 35.1* 36.9*  PLT 301 287 325   Coag's No results for input(s): APTT, INR in the last 168 hours. BMET  Recent Labs Lab 11/23/13 1336 11/24/13 0530  11/25/13 0551  NA 143 138 138  K 4.5 4.2 4.3  CL 102 98 97  CO2 28 26 28   BUN 12 13 21   CREATININE 1.08 1.04 1.21  GLUCOSE 157* 220* 196*   Electrolytes  Recent Labs Lab 11/23/13 1336 11/24/13 0530 11/25/13 0551  CALCIUM 9.4 9.4 9.8   Sepsis Markers No results for input(s): LATICACIDVEN, PROCALCITON, O2SATVEN in the last 168 hours. ABG  Recent Labs Lab 11/24/13 0704  PHART 7.365  PCO2ART 43.1  PO2ART 524.0*   Liver Enzymes  Recent Labs Lab 11/25/13 0551  AST 22  ALT 13  ALKPHOS 90  BILITOT <0.2*  ALBUMIN 3.5   Cardiac Enzymes  Recent Labs Lab 11/23/13 1458  PROBNP 97.5   Glucose  Recent Labs Lab 11/24/13 1239 11/24/13 1630 11/24/13 1944 11/25/13 0006 11/25/13 0345 11/25/13 0434  GLUCAP 161* 137* 208* 149* 192* 197*    Imaging No results found.  CXR>>>chronic changes, likely more alveolar process rt greater left  ASSESSMENT / PLAN:  PULMONARY OETT n/a A: Acute hypoxemic respiratory failure consistent with flare of bronchiectasis and sarcoidosis, similar to previous flares with an asthmatic phenotype Course complicated by medication non-compliance due to inability to afford medications R/o PNA on top Edema component P:   -NIMV to try and avoid today and use prn -follow pcxr in am  -consider lasix with improved pcxr and status, but reduce with slight crt rise -Solu-Medrol maintain, has benefited him for sure -Brovana twice a day -Pulmicort twice a day -Albuterol every  4 hours and every 2 hours prn -Monitor O2 sats, goal greater than 90% -doppler legs pending  CARDIOVASCULAR A: Sinus tachycardia secondary to respiratory distress,improved htn P:  -continue to hold metoprolol, calcium channel may be able to dc as distress improves  -overall prefer BB for him, some hesitance with wheezing day prior -tele  RENAL A:  slight rise crt, from diuresis P:   -lasix  X 2 doses, reduce once today only or dc -chem in am   -kvo  GASTROINTESTINAL A:  Distress improved P:   -diet ok as distress better  HEMATOLOGIC A:  DVt prevention P:  -Monitor for bleeding -doppler legs awaited -addition sub q hep on hold with some report blood tinged secretions  INFECTIOUS A:  Acute exacerbation of bronchiectasis, r/o PNA P:   Sputum November 13>  Abx:  -vancomycin 11/13>>> -Zosyn 11/13>>> -levofloxcacin 11/14>>>  Follow micro, likely dc vanc in am   ENDOCRINE A:  No acute issues P:   -monitor glucose as on steroids and increased  NEUROLOGIC A:  Chronic pain P:   -continue home flexeril, pregabalin   FAMILY  - Updates: patient updated 11/14   TODAY'S SUMMARY: Improved overall, attempt reduce BIPAP, dc lasix  [name] [name], MD, FACP Pgr: [phone] [facility]

## 2013-11-25 NOTE — Plan of Care (Signed)
Problem: Phase I Progression Outcomes Goal: Pain controlled Outcome: Completed/Met Date Met:  11/25/13 Goal: Tolerating diet Outcome: Completed/Met Date Met:  11/25/13

## 2013-11-25 NOTE — Progress Notes (Signed)
Nutrition Brief Note  Patient identified on the Malnutrition Screening Tool (MST) Report  Pt reports UBW of 160-170 lb, unsure of weight loss. Pt with some pulmonary edema, was previously on Lasix, suspect weight fluctuations d/t fluid.  Pt states his appetite is adequate PTA and currently.  Wt Readings from Last 15 Encounters:  11/23/13 172 lb (78.019 kg)  10/12/13 181 lb 12.8 oz (82.464 kg)  07/27/13 162 lb (73.483 kg)  05/15/13 169 lb (76.658 kg)  03/28/13 180 lb (81.647 kg)  02/28/13 185 lb 9.6 oz (84.188 kg)  01/18/13 186 lb 9.6 oz (84.641 kg)  12/18/12 175 lb 3.2 oz (79.47 kg)  11/17/12 177 lb 6.4 oz (80.468 kg)  09/15/12 182 lb 12.8 oz (82.918 kg)  08/31/12 182 lb 6.4 oz (82.736 kg)  08/10/12 177 lb (80.287 kg)  08/09/12 176 lb (79.833 kg)  08/03/12 190 lb (86.183 kg)  06/27/12 189 lb 6.4 oz (85.911 kg)    Body mass index is 26.93 kg/(m^2). Patient meets criteria for overweight based on current BMI.   Current diet order is regular, patient is consuming approximately 100% of meals at this time. Labs and medications reviewed.   No nutrition interventions warranted at this time. If nutrition issues arise, please consult RD.   Tilda FrancoLindsey Carrie Schoonmaker, MS, RD, LDN Pager: [phone] After Hours Pager: [phone]

## 2013-11-25 NOTE — Plan of Care (Signed)
Problem: ICU Phase Progression Outcomes Goal: Pain controlled with appropriate interventions Outcome: Completed/Met Date Met:  11/25/13

## 2013-11-25 NOTE — Progress Notes (Signed)
ANTIBIOTIC CONSULT NOTE - Follow up  Pharmacy Consult for Vancomycin & Zosyn Indication: pneumonia  Allergies  Allergen Reactions  . Clindamycin/Lincomycin Anaphylaxis and Swelling    Made face and tongue swell   Patient Measurements: Height: 5\' 7"  (170.2 cm) Weight: 174 lb 13.2 oz (79.3 kg) IBW/kg (Calculated) : 66.1  Vital Signs: Temp: 97.6 F (36.4 C) (11/15 0800) Temp Source: Oral (11/15 0800) BP: 142/92 mmHg (11/15 1347) Pulse Rate: 83 (11/15 1213) Intake/Output from previous day: 11/14 0701 - 11/15 0700 In: 1160 [P.O.:120; I.V.:40; IV Piggyback:1000] Out: 3025 [Urine:3025] Intake/Output from this shift: Total I/O In: 1060 [P.O.:660; IV Piggyback:400] Out: 1075 [Urine:1075]  Labs:  Recent Labs  11/23/13 1336 11/24/13 0530 11/25/13 0551  WBC 4.3 4.1 15.8*  HGB 11.8* 11.4* 11.8*  PLT 301 287 325  CREATININE 1.08 1.04 1.21   Estimated Creatinine Clearance: 69 mL/min (by C-G formula based on Cr of 1.21).  Recent Labs  11/25/13 0609 11/25/13 1447  VANCOTROUGH 54.6* 16.9     Microbiology: Recent Results (from the past 720 hour(s))  Culture, respiratory (NON-Expectorated)     Status: None (Preliminary result)   Collection Time: 11/24/13  6:00 AM  Result Value Ref Range Status   Specimen Description SPUTUM  Final   Special Requests NONE  Final   Gram Stain PENDING  Incomplete   Culture   Final    NORMAL OROPHARYNGEAL FLORA Performed at [facility]    Report Status PENDING  Incomplete  Culture, expectorated sputum-assessment     Status: None   Collection Time: 11/24/13  6:03 AM  Result Value Ref Range Status   Specimen Description SPUTUM  Final   Special Requests Normal  Final   Sputum evaluation   Final    THIS SPECIMEN IS ACCEPTABLE. RESPIRATORY CULTURE REPORT TO FOLLOW.   Report Status 11/24/2013 FINAL  Final    Anti-infectives    Start     Dose/Rate Route Frequency Ordered Stop   11/24/13 1000  levofloxacin (LEVAQUIN) IVPB 750 mg      750 mg100 mL/hr over 90 Minutes Intravenous Every 24 hours 11/24/13 0743     11/24/13 0600  vancomycin (VANCOCIN) IVPB 1000 mg/200 mL premix     1,000 mg200 mL/hr over 60 Minutes Intravenous Every 8 hours 11/23/13 1913     11/23/13 2100  vancomycin (VANCOCIN) 1,500 mg in sodium chloride 0.9 % 500 mL IVPB     1,500 mg250 mL/hr over 120 Minutes Intravenous  Once 11/23/13 1912 11/23/13 2203   11/23/13 2000  piperacillin-tazobactam (ZOSYN) IVPB 3.375 g     3.375 g12.5 mL/hr over 240 Minutes Intravenous Every 8 hours 11/23/13 1905       Assessment: 48 yoM with hx of Sarcoid, admit for exacerbation, treat for HCAP. Pharmacy to dose Vancomycin and Zosyn. Previous Vanc dosing 07/2012 of 1gm q8hr resulted in trough of 19 mcg/ml.  11/13 >> Vanc>> 11/13 >> Zosyn >>  11/14 >> Levaquin >>  Tmax: Afebrile WBCs: wnl (steroid) Renal: SCr 1.04, 87N  11/14: sputum: normal flora  Drug level / dose changes info: 11/15: VT = 54.6(drawn after dose hung), redraw -> VT = 16.9.  Goal of Therapy:  Vancomycin trough level 15-20 mcg/ml  Appropriate antibiotic dosing for renal function; eradication of infection  Plan:   Cont Vancomycin 1g IV q8h.  Cont Zosyn 3.375g IV Q8H infused over 4hrs.  Check Vanc trough at steady state  Follow up renal fxn and culture results.  Charolotte Ekeom Chrsitopher Wik, PharmD, pager [phone]. 11/25/2013,3:59  PM.

## 2013-11-26 ENCOUNTER — Inpatient Hospital Stay (HOSPITAL_COMMUNITY): Payer: 59

## 2013-11-26 LAB — BASIC METABOLIC PANEL
Anion gap: 10 (ref 5–15)
BUN: 21 mg/dL (ref 6–23)
CALCIUM: 9.1 mg/dL (ref 8.4–10.5)
CO2: 29 meq/L (ref 19–32)
CREATININE: 1.02 mg/dL (ref 0.50–1.35)
Chloride: 102 mEq/L (ref 96–112)
GFR calc Af Amer: >90 mL/min (ref >90)
GFR calc non Af Amer: 85 mL/min — ABNORMAL LOW (ref >90)
GLUCOSE: 134 mg/dL — AB (ref 70–99)
Potassium: 4.2 mEq/L (ref 3.7–5.3)
Sodium: 141 mEq/L (ref 137–147)

## 2013-11-26 LAB — CBC WITH DIFFERENTIAL/PLATELET
BASOS ABS: 0 10*3/uL (ref 0.0–0.1)
BASOS PCT: 0 % (ref 0–1)
EOS PCT: 0 % (ref 0–5)
Eosinophils Absolute: 0 10*3/uL (ref 0.0–0.7)
HEMATOCRIT: 37.6 % — AB (ref 39.0–52.0)
HEMOGLOBIN: 12 g/dL — AB (ref 13.0–17.0)
Lymphocytes Relative: 4 % — ABNORMAL LOW (ref 12–46)
Lymphs Abs: 0.6 10*3/uL — ABNORMAL LOW (ref 0.7–4.0)
MCH: 26.6 pg (ref 26.0–34.0)
MCHC: 31.9 g/dL (ref 30.0–36.0)
MCV: 83.4 fL (ref 78.0–100.0)
MONO ABS: 0.3 10*3/uL (ref 0.1–1.0)
MONOS PCT: 2 % — AB (ref 3–12)
Neutro Abs: 13.8 10*3/uL — ABNORMAL HIGH (ref 1.7–7.7)
Neutrophils Relative %: 94 % — ABNORMAL HIGH (ref 43–77)
Platelets: 347 10*3/uL (ref 150–400)
RBC: 4.51 MIL/uL (ref 4.22–5.81)
RDW: 12.7 % (ref 11.5–15.5)
WBC: 14.7 10*3/uL — ABNORMAL HIGH (ref 4.0–10.5)

## 2013-11-26 LAB — VANCOMYCIN, TROUGH: Vancomycin Tr: 18.7 ug/mL (ref 10.0–20.0)

## 2013-11-26 LAB — GLUCOSE, CAPILLARY
GLUCOSE-CAPILLARY: 178 mg/dL — AB (ref 70–99)
GLUCOSE-CAPILLARY: 205 mg/dL — AB (ref 70–99)
Glucose-Capillary: 124 mg/dL — ABNORMAL HIGH (ref 70–99)
Glucose-Capillary: 157 mg/dL — ABNORMAL HIGH (ref 70–99)
Glucose-Capillary: 288 mg/dL — ABNORMAL HIGH (ref 70–99)
Glucose-Capillary: 157 mg/dL — ABNORMAL HIGH (ref 70–99)

## 2013-11-26 LAB — LEGIONELLA ANTIGEN, URINE

## 2013-11-26 MED ORDER — METHYLPREDNISOLONE SODIUM SUCC 40 MG IJ SOLR
40.0000 mg | Freq: Three times a day (TID) | INTRAMUSCULAR | Status: DC
  Administered 2013-11-26 – 2013-11-27 (×3): 40 mg via INTRAVENOUS
  Filled 2013-11-26 (×6): qty 1

## 2013-11-26 MED ORDER — INSULIN ASPART 100 UNIT/ML ~~LOC~~ SOLN
0.0000 [IU] | Freq: Three times a day (TID) | SUBCUTANEOUS | Status: DC
  Administered 2013-11-26 – 2013-11-27 (×4): 3 [IU] via SUBCUTANEOUS
  Administered 2013-11-28 (×2): 2 [IU] via SUBCUTANEOUS

## 2013-11-26 MED ORDER — ALUM & MAG HYDROXIDE-SIMETH 200-200-20 MG/5ML PO SUSP
30.0000 mL | Freq: Three times a day (TID) | ORAL | Status: DC | PRN
  Administered 2013-11-26: 30 mL via ORAL
  Filled 2013-11-26: qty 30

## 2013-11-26 MED ORDER — ALUM & MAG HYDROXIDE-SIMETH 200-200-25 MG PO CHEW
2.0000 | CHEWABLE_TABLET | Freq: Three times a day (TID) | ORAL | Status: DC | PRN
  Filled 2013-11-26: qty 2

## 2013-11-26 NOTE — Progress Notes (Addendum)
PULMONARY / CRITICAL CARE MEDICINE   Name: [name] MRN: [id number] DOB: 06/09/1965    ADMISSION DATE:  11/23/2013  REFERRING MD :  Jodi MourningZavitz [name]  CHIEF COMPLAINT:  Shortness of breath  INITIAL PRESENTATION: 48 y/o male with sarcoid and bronchiectasis [facility] with acute hypoxemic respiratory failure presumably from a flare of bronchiectasis and sarcoid.  STUDIES:   SIGNIFICANT EVENTS: 11/13 admission 11/14 declined with NIMV needed now 11/15 neg 1.8 liters, improve dpcxr edema on top 11/16 transition back to CPAP qhs  SUBJECTIVE:  Breathing improved.  Denies chest pain.  Feels like he is close to baseline  VITAL SIGNS: Temp:  [97.5 F (36.4 C)-98.7 F (37.1 C)] 97.9 F (36.6 C) (11/16 0800) Pulse Rate:  [42-100] 97 (11/16 0931) Resp:  [16-21] 20 (11/16 0412) BP: (130-149)/(77-130) 141/81 mmHg (11/16 0931) SpO2:  [87 %-100 %] 99 % (11/16 0412) FiO2 (%):  [30 %] 30 % (11/16 0412) Weight:  [178 lb 5.6 oz (80.9 kg)] 178 lb 5.6 oz (80.9 kg) (11/16 0412) INTAKE / OUTPUT:  Intake/Output Summary (Last 24 hours) at 11/26/13 1118 Last data filed at 11/26/13 0600  Gross per 24 hour  Intake   1500 ml  Output    703 ml  Net    797 ml    PHYSICAL EXAMINATION: Gen: no distress HEENT: no sinus tenderness PULM: b/l rales, no wheeze CV: regular AB: soft, non tender Ext: no edema Derm: no rashes Neuro: normal strength  LABS:  CBC  Recent Labs Lab 11/24/13 0530 11/25/13 0551 11/26/13 0343  WBC 4.1 15.8* 14.7*  HGB 11.4* 11.8* 12.0*  HCT 35.1* 36.9* 37.6*  PLT 287 325 347   BMET  Recent Labs Lab 11/24/13 0530 11/25/13 0551 11/26/13 0343  NA 138 138 141  K 4.2 4.3 4.2  CL 98 97 102  CO2 26 28 29   BUN 13 21 21   CREATININE 1.04 1.21 1.02  GLUCOSE 220* 196* 134*   Electrolytes  Recent Labs Lab 11/24/13 0530 11/25/13 0551 11/26/13 0343  CALCIUM 9.4 9.8 9.1   ABG  Recent Labs Lab 11/24/13 0704  PHART 7.365  PCO2ART 43.1   PO2ART 524.0*   Liver Enzymes  Recent Labs Lab 11/25/13 0551  AST 22  ALT 13  ALKPHOS 90  BILITOT <0.2*  ALBUMIN 3.5   Cardiac Enzymes  Recent Labs Lab 11/23/13 1458  PROBNP 97.5   Glucose  Recent Labs Lab 11/25/13 1220 11/25/13 1547 11/25/13 2006 11/25/13 2332 11/26/13 0354 11/26/13 0813  GLUCAP 179* 187* 193* 205* 124* 157*    Imaging Dg Chest Port 1 View  11/25/2013   CLINICAL DATA:  Acute respiratory acidosis.  History sarcoidosis.  EXAM: PORTABLE CHEST - 1 VIEW  COMPARISON:  Multiple prior studies, most recent chest radiograph is 11/23/2013.  FINDINGS: The heart size is within normal limits. Extensive chronic changes of the lungs with marked architectural distortion, interstitial prominence, and bilateral perihilar areas of consolidation are without significant change compared to chest radiographs of 11/23/2013 and 08/22/2013. Chronic slight blunting of the costophrenic angles. Negative for pneumothorax. No acute bony abnormality.  IMPRESSION: Stable chronic changes consistent with advanced sarcoidosis given patient history. No acute superimposed abnormality is identified.   Electronically Signed   By: Britta MccreedySusan  Turner M.D.   On: 11/25/2013 08:08    ASSESSMENT / PLAN:  PULMONARY A:  Acute on chronic hypoxic respiratory failure 2nd to BTX exacerbation in setting of sarcoidosis, pulmonary edema. Hx of OSA. P:  Oxygen to keep SpO > 90% F/u CXR intermittently PAP therapy as needed Continue brovana/pulmicort >> hold outpt symbicort for now Prn albuterol Change solumedrol to 40 mg q8h  CARDIOVASCULAR A:  Sinus tachycardia secondary to respiratory distress,improved Hx of HTN. Acute on chronic diastolic dysfx. P:  Continue cardizem, lopressor Goal negative fluid balance as tolerated >> hold additional lasix for now F/u doppler legs  RENAL A:   No acute issues. P:   Monitor renal fx, urine outpt  GASTROINTESTINAL A:   Nutrition. P:   Regular  diet Pepcid for SUP  HEMATOLOGIC A:   No acute issues. P:  F/u CBC Lovenox for DVT prevention  INFECTIOUS A:   Acute BTX exacerbation. P:   Day 4 of Abx, currently on zosyn/levaquin >> narrow once cx finalized D/c vancomycin 11/16  Sputum 11/14 >>  Legionella Ag 11/14 >>   ENDOCRINE A:   Steroid induced hyperglycemia. P:   SSI  NEUROLOGIC A:   Chronic pain. P:   PRN flexeril, pregabalin  Transfer to floor bed 11/16.  Coralyn HellingVineet Floreine Kingdon, MD [facility] 11/26/2013, 11:36 AM Pager:  [phone] After 3pm call: [phone]

## 2013-11-26 NOTE — Progress Notes (Signed)
Pt requesting to come off BIPAP at this time, pt placed on 3 LPM [place] and tolerating well at this time.  Rt to monitor and assess as needed.

## 2013-11-27 LAB — GLUCOSE, CAPILLARY
GLUCOSE-CAPILLARY: 160 mg/dL — AB (ref 70–99)
GLUCOSE-CAPILLARY: 187 mg/dL — AB (ref 70–99)
Glucose-Capillary: 167 mg/dL — ABNORMAL HIGH (ref 70–99)
Glucose-Capillary: 169 mg/dL — ABNORMAL HIGH (ref 70–99)

## 2013-11-27 LAB — CULTURE, RESPIRATORY

## 2013-11-27 LAB — CULTURE, RESPIRATORY W GRAM STAIN: Culture: NORMAL

## 2013-11-27 MED ORDER — PREDNISONE 20 MG PO TABS
30.0000 mg | ORAL_TABLET | Freq: Every day | ORAL | Status: DC
  Administered 2013-11-27 – 2013-11-28 (×2): 30 mg via ORAL
  Filled 2013-11-27 (×3): qty 1

## 2013-11-27 MED ORDER — BUDESONIDE-FORMOTEROL FUMARATE 160-4.5 MCG/ACT IN AERO
2.0000 | INHALATION_SPRAY | Freq: Two times a day (BID) | RESPIRATORY_TRACT | Status: DC
Start: 2013-11-27 — End: 2013-11-28
  Administered 2013-11-27 – 2013-11-28 (×3): 2 via RESPIRATORY_TRACT
  Filled 2013-11-27: qty 6

## 2013-11-27 NOTE — Plan of Care (Signed)
Problem: Consults Goal: COPD Patient Education (See Patient Education Module for education specifics.)  Outcome: Completed/Met Date Met:  11/27/13  Problem: Phase I Progression Outcomes Goal: Dyspnea controlled at rest Outcome: Progressing

## 2013-11-27 NOTE — Care Management Note (Signed)
CARE MANAGEMENT NOTE 11/27/2013  Patient:  [name],[name]   Account Number:  [id number]  Date Initiated:  11/27/2013  Documentation initiated by:  Sandford CrazeLEMENTS,Shakiyla Kook  Subjective/Objective Assessment:   48 yo admitted with Bronchiectasis with acute exacerbation and Sarcoid.     Action/Plan:   From home with significant other.   Anticipated DC Date:  11/30/2013   Anticipated DC Plan:  HOME/SELF CARE      DC Planning Services  CM consult      Choice offered to / List presented to:             Status of service:  In process, will continue to follow Medicare Important Message given?   (If response is "NO", the following Medicare IM given date fields will be blank) Date Medicare IM given:   Medicare IM given by:   Date Additional Medicare IM given:   Additional Medicare IM given by:    Discharge Disposition:    Per UR Regulation:  Reviewed for med. necessity/level of care/duration of stay  If discussed at Long Length of Stay Meetings, dates discussed:    Comments:  11/27/13 Sandford Crazeora Katherine Tout RN,BSN,NCM [phone] Chart reviewed and CM following for DC needs.

## 2013-11-27 NOTE — Progress Notes (Signed)
PULMONARY / CRITICAL CARE MEDICINE   Name: [name] MRN: [id number] DOB: 06/01/1965    ADMISSION DATE:  11/23/2013  REFERRING MD :  Jodi MourningZavitz [name]  CHIEF COMPLAINT:  Shortness of breath  INITIAL PRESENTATION: 48 y/o male with sarcoid and bronchiectasis [facility] with acute hypoxemic respiratory failure presumably from a flare of bronchiectasis and sarcoid.  STUDIES:   SIGNIFICANT EVENTS: 11/13 admission 11/14 declined with NIMV needed now 11/15 neg 1.8 liters, improve dpcxr edema on top 11/16 transition back to CPAP qhs  SUBJECTIVE:  Feels better.  Not as much cough or sputum.  Slept well overnight.  VITAL SIGNS: Temp:  [97.6 F (36.4 C)-98.6 F (37 C)] 97.8 F (36.6 C) (11/17 0436) Pulse Rate:  [82-99] 99 (11/17 0700) Resp:  [16-21] 18 (11/17 0700) BP: (130-149)/(84-94) 130/90 mmHg (11/17 0436) SpO2:  [98 %-100 %] 98 % (11/17 0853) Weight:  [180 lb 5.4 oz (81.8 kg)-186 lb 4.6 oz (84.5 kg)] 186 lb 4.6 oz (84.5 kg) (11/17 0436) INTAKE / OUTPUT:  Intake/Output Summary (Last 24 hours) at 11/27/13 1216 Last data filed at 11/27/13 0847  Gross per 24 hour  Intake    290 ml  Output      0 ml  Net    290 ml    PHYSICAL EXAMINATION: Gen: no distress HEENT: no sinus tenderness PULM: scattered areas of wheeze CV: regular AB: soft, non tender Ext: no edema Derm: no rashes Neuro: normal strength  LABS:  CBC  Recent Labs Lab 11/24/13 0530 11/25/13 0551 11/26/13 0343  WBC 4.1 15.8* 14.7*  HGB 11.4* 11.8* 12.0*  HCT 35.1* 36.9* 37.6*  PLT 287 325 347   BMET  Recent Labs Lab 11/24/13 0530 11/25/13 0551 11/26/13 0343  NA 138 138 141  K 4.2 4.3 4.2  CL 98 97 102  CO2 26 28 29   BUN 13 21 21   CREATININE 1.04 1.21 1.02  GLUCOSE 220* 196* 134*   Electrolytes  Recent Labs Lab 11/24/13 0530 11/25/13 0551 11/26/13 0343  CALCIUM 9.4 9.8 9.1   ABG  Recent Labs Lab 11/24/13 0704  PHART 7.365  PCO2ART 43.1  PO2ART 524.0*    Liver Enzymes  Recent Labs Lab 11/25/13 0551  AST 22  ALT 13  ALKPHOS 90  BILITOT <0.2*  ALBUMIN 3.5   Cardiac Enzymes  Recent Labs Lab 11/23/13 1458  PROBNP 97.5   Glucose  Recent Labs Lab 11/26/13 0813 11/26/13 1143 11/26/13 1721 11/26/13 2040 11/27/13 0724 11/27/13 1145  GLUCAP 157* 288* 157* 178* 160* 167*    Imaging Dg Chest Port 1 View  11/26/2013   CLINICAL DATA:  Acute respiratory acidosis. Current history of sarcoidosis and chronic respiratory failure requiring CPAP.  EXAM: PORTABLE CHEST - 1 VIEW  COMPARISON:  Portable chest x-ray yesterday. Two-view chest x-ray 11/23/2013, 08/31/2012. CT chest 10/19/2013.  FINDINGS: Cardiomediastinal silhouette unremarkable, unchanged. Conglomerate scarring in both mid lungs, pleuroparenchymal scarring in the bases, and biapical pleuroparenchymal scarring, unchanged. Macronodular opacities in the lung bases, left greater than right, unchanged. No new pulmonary parenchymal abnormalities.  IMPRESSION: Stable chronic changes of end-stage sarcoidosis. No new/acute cardiopulmonary disease.   Electronically Signed   By: Hulan Saashomas  Lawrence M.D.   On: 11/26/2013 08:01    ASSESSMENT / PLAN:  PULMONARY A:  Acute on chronic hypoxic respiratory failure 2nd to BTX exacerbation in setting of sarcoidosis, pulmonary edema. Hx of OSA. P:   Oxygen to keep SpO > 90% F/u CXR intermittently CPAP qhs Resume symbicort  Prn albuterol D/c solumedrol and change to prednisone 30 mg on 11/17  CARDIOVASCULAR A:  Sinus tachycardia secondary to respiratory distress,improved Hx of HTN. Acute on chronic diastolic dysfx. P:  Continue cardizem, lopressor Goal even fluid balance as tolerated >> hold additional lasix for now  RENAL A:   No acute issues. P:   Monitor renal fx, urine outpt  GASTROINTESTINAL A:   Nutrition. P:   Regular diet Pepcid for SUP  HEMATOLOGIC A:   No acute issues. P:  F/u CBC Lovenox for DVT  prevention  INFECTIOUS A:   Acute BTX exacerbation. P:   Day 5 of Abx, currently on levaquin D/c zosyn 11/17  ENDOCRINE A:   Steroid induced hyperglycemia. P:   SSI  NEUROLOGIC A:   Chronic pain. P:   PRN flexeril, pregabalin  Slowly improving.  Possible d/c home 11/18.  Coralyn HellingVineet Tajae Maiolo, MD [facility] 11/27/2013, 12:16 PM Pager:  [phone] After 3pm call: [phone]

## 2013-11-28 LAB — GLUCOSE, CAPILLARY
GLUCOSE-CAPILLARY: 143 mg/dL — AB (ref 70–99)
Glucose-Capillary: 141 mg/dL — ABNORMAL HIGH (ref 70–99)

## 2013-11-28 MED ORDER — FUROSEMIDE 20 MG PO TABS: 20.0000 mg | ORAL_TABLET | Freq: Every day | ORAL | Status: DC | PRN

## 2013-11-28 MED ORDER — LEVOFLOXACIN 750 MG PO TABS: 750.0000 mg | ORAL_TABLET | Freq: Every day | ORAL | Status: DC

## 2013-11-28 MED ORDER — PREDNISONE 10 MG PO TABS: ORAL_TABLET | ORAL | Status: DC

## 2013-11-28 MED ORDER — SALINE SPRAY 0.65 % NA SOLN: 1.0000 | NASAL | Status: DC | PRN

## 2013-11-28 MED ORDER — BUDESONIDE-FORMOTEROL FUMARATE 160-4.5 MCG/ACT IN AERO
2.0000 | INHALATION_SPRAY | Freq: Two times a day (BID) | RESPIRATORY_TRACT | Status: DC
Start: 2013-11-28 — End: 2015-04-25

## 2013-11-28 MED ORDER — DILTIAZEM HCL 30 MG PO TABS: 30.0000 mg | ORAL_TABLET | Freq: Three times a day (TID) | ORAL | Status: DC

## 2013-11-28 NOTE — Discharge Instructions (Signed)
1.  Review your medications carefully as they have changed 2.  Follow up with Dr. Vassie LollAlva as scheduled 3.  Complete all of you antibiotics as instructed 4.  Slow prednisone taper to 10 mg daily 5.  Wear your CPAP every night 6.  Take Lasix (water pill) daily as needed per instructions:  If you have greater than a 3 pound weight gain in 24 hours, 5 pounds in one week or swelling of LE's.  Call MD if no relief with lasix or if worsening / distress.  7.  Continue oxygen at 2-3 L continuously 8.  Record your weights & blood pressures.  Take with you to your appointment with your primary MD.

## 2013-11-28 NOTE — Progress Notes (Signed)
PULMONARY / CRITICAL CARE MEDICINE   Name: [name] MRN: [id number] DOB: 06/22/1965    ADMISSION DATE:  11/23/2013  REFERRING MD :  Jodi MourningZavitz [name]  CHIEF COMPLAINT:  Shortness of breath  INITIAL PRESENTATION: 48 y/o male with sarcoid and bronchiectasis was [facility] with acute hypoxemic respiratory failure presumably from a flare of bronchiectasis and sarcoid.  He is followed by Dr. Delton CoombesByrum as outpt.  STUDIES:   SIGNIFICANT EVENTS: 11/13 admission 11/14 declined with NIMV needed now 11/15 neg 1.8 liters, improve dpcxr edema on top 11/16 transition back to CPAP qhs  SUBJECTIVE:  Coughed up large plug last night >> breathing better after this.  Slept well.   Anxious to go home.  VITAL SIGNS: Temp:  [97.6 F (36.4 C)-98.3 F (36.8 C)] 97.6 F (36.4 C) (11/18 0458) Pulse Rate:  [73-91] 73 (11/18 0458) Resp:  [20-22] 20 (11/18 0458) BP: (137-152)/(88-99) 140/92 mmHg (11/18 0951) SpO2:  [100 %] 100 % (11/18 0927) Weight:  [188 lb 1.6 oz (85.322 kg)] 188 lb 1.6 oz (85.322 kg) (11/18 0458) INTAKE / OUTPUT:  Intake/Output Summary (Last 24 hours) at 11/28/13 1011 Last data filed at 11/28/13 0801  Gross per 24 hour  Intake    360 ml  Output      0 ml  Net    360 ml    PHYSICAL EXAMINATION: Gen: no distress HEENT: no sinus tenderness PULM: no wheeze CV: regular AB: soft, non tender Ext: no edema Derm: no rashes Neuro: normal strength  LABS:  CBC  Recent Labs Lab 11/24/13 0530 11/25/13 0551 11/26/13 0343  WBC 4.1 15.8* 14.7*  HGB 11.4* 11.8* 12.0*  HCT 35.1* 36.9* 37.6*  PLT 287 325 347   BMET  Recent Labs Lab 11/24/13 0530 11/25/13 0551 11/26/13 0343  NA 138 138 141  K 4.2 4.3 4.2  CL 98 97 102  CO2 26 28 29   BUN 13 21 21   CREATININE 1.04 1.21 1.02  GLUCOSE 220* 196* 134*   Electrolytes  Recent Labs Lab 11/24/13 0530 11/25/13 0551 11/26/13 0343  CALCIUM 9.4 9.8 9.1   ABG  Recent Labs Lab 11/24/13 0704  PHART 7.365   PCO2ART 43.1  PO2ART 524.0*   Liver Enzymes  Recent Labs Lab 11/25/13 0551  AST 22  ALT 13  ALKPHOS 90  BILITOT <0.2*  ALBUMIN 3.5   Cardiac Enzymes  Recent Labs Lab 11/23/13 1458  PROBNP 97.5   Glucose  Recent Labs Lab 11/26/13 2040 11/27/13 0724 11/27/13 1145 11/27/13 1739 11/27/13 2109 11/28/13 0725  GLUCAP 178* 160* 167* 169* 187* 143*    Imaging No results found.  ASSESSMENT / PLAN:  Acute on chronic hypoxic respiratory failure 2nd to BTX exacerbation in setting of sarcoidosis, pulmonary edema. Hx of OSA. P:   Oxygen to keep SpO > 90% CPAP qhs Continue symbicort and prn albuterol Wean off prednisone as tolerated down to 10 mg per day >> then re-assess as outpt whether he could tolerate further reduction in prednisone dose Day 6/10 of Abx, currently on levaquin >> then re-assess as outpt to determine if he would benefit from cyclic Abx regimen  Sinus tachycardia secondary to respiratory distress >> resolved. Hx of HTN. Acute on chronic diastolic dysfx. P:  Continue cardizem, lopressor PRN lasix as outpt  Steroid induced hyperglycemia. P:   Will not need SSI as outpt once prednisone dose reduced   Chronic pain. P:   PRN flexeril, pregabalin  Hx of GERD. Plan: Pepcid  SUMMARY: Improved.  He is ready for d/c home.  He will need outpt pulmonary follow up with in 1 to 2 weeks.  Coralyn HellingVineet Aslin Farinas, MD [facility] 11/28/2013, 10:11 AM Pager:  [phone] After 3pm call: [phone]

## 2013-11-28 NOTE — Discharge Summary (Signed)
Physician Discharge Summary  Patient ID: [name] MRN: [id number] DOB/AGE: 1966/01/07 48 y.o.  Admit date: 11/23/2013 Discharge date: 11/28/2013    Discharge Diagnoses:  Acute on Chronic Hypoxic Respiratory Failure secondary to Bronchiectasis Exacerbation  Sarcoidosis  Pulmonary Edema  OSA Sinus Tachycardia  HTN Acute on Chronic Diastolic Dysfunction  Steroid induced Hyperglycemia  Chronic Pain  GERD                                                                      DISCHARGE PLAN BY DIAGNOSIS     Acute on chronic hypoxic respiratory failure 2nd to BTX exacerbation in setting of sarcoidosis, pulmonary edema. Hx of OSA.  Discharge Plan: Oxygen 2-3 L per [place], goal saturations 90-95% CPAP qhs Continue symbicort and prn albuterol Wean off prednisone as tolerated down to 10 mg per day >> then re-assess as outpt whether he could tolerate further reduction in prednisone dose Day 6/10 of Abx, currently on levaquin >> then re-assess as outpt to determine if he would benefit from cyclic Abx regimen  Sinus tachycardia secondary to respiratory distress >> resolved. Hx of HTN. Acute on chronic diastolic dysfx.  Discharge Plan: Continue cardizem, lopressor PRN lasix as outpt  Steroid induced hyperglycemia.  Discharge Plan: Will not need SSI as outpt once prednisone dose reduced  Chronic pain.  Discharge Plan:  PRN flexeril, pregabalin  Hx of GERD.  Discharge Plan: Pepcid                     DISCHARGE SUMMARY   [name] is a 48 y.o. y/o male, never smoker, with a PMH of sarcoidosis dx by transbronchial bx's in 2002 and again in 2011/2012, bronchiectasis with frequent exacerbations, O2 dependence 2L, OSA on CPAP, previously evaluated for lung transplant but declined due to positive UDS for cocaine, HTN, dCHF who presented to [facility] ER on 11/13 with complaints of a 1 week history of progressive shortness of breath despite home breathing treatments,  chills, sinus congestion, HA, chest tightness, increased sputum production with change in color and increased O2 need to 3L per [place].  The patient reported he had difficulty obtaining medications due to cost and has not been compliant with medications.  He was admitted to the medical floor for further work up.  The patient was treated with IV solu-medrol, Brovana, Pulmicort, IV vancomycin / zosyn and oxygen to keep saturations greater than 90%.  Hospital course notable for steroid induced hyperglycemia, slow resolution of dyspnea / wheezing, tachycardia and pulmonary edema.  Unfortunately, he decompensated on 11/14 and was transferred to the ICU for NIMV.  He was treated with lasix with good response to diuresis (edema improved on pcxr).  He was able to be transitioned out of ICU 11/16 and back to home regimen of CPAP + oxygen.  Sputum cultures were negative and antibiotics were narrowed.  Cardizem was added during hospitalization in the setting of tachycardia.  Initially home lopressor was held but this was restarted as he improved.  There has been hesitation to utilize lopressor with his asthmatic component but this was tolerated well while inpatient.  See discharge plans as above.   SIGNIFICANT EVENTS: 11/13  Admit with resp fx, bronchiectasis flare 11/14  declined, tx to  ICU on BiPAP.  Levaquin added 11/14  LE Dopplers >> neg  11/15  neg 1.8 liters, improved pcxr edema  11/16  transition back to CPAP qhs    Discharge Exam: Gen: no distress HEENT: no sinus tenderness PULM: no wheeze CV: regular AB: soft, non tender Ext: no edema Derm: no rashes Neuro: normal strength  Filed Vitals:   11/27/13 2111 11/28/13 0458 11/28/13 0927 11/28/13 0951  BP: 152/99 137/88  140/92  Pulse: 91 73    Temp: 98.1 F (36.7 C) 97.6 F (36.4 C)    TempSrc: Oral Oral    Resp: 20 20    Height:      Weight:  188 lb 1.6 oz (85.322 kg)    SpO2: 100% 100% 100%      Discharge Labs  BMET  Recent Labs Lab  11/23/13 1336 11/24/13 0530 11/25/13 0551 11/26/13 0343  NA 143 138 138 141  K 4.5 4.2 4.3 4.2  CL 102 98 97 102  CO2 28 26 28 29   GLUCOSE 157* 220* 196* 134*  BUN 12 13 21 21   CREATININE 1.08 1.04 1.21 1.02  CALCIUM 9.4 9.4 9.8 9.1    CBC  Recent Labs Lab 11/24/13 0530 11/25/13 0551 11/26/13 0343  HGB 11.4* 11.8* 12.0*  HCT 35.1* 36.9* 37.6*  WBC 4.1 15.8* 14.7*  PLT 287 325 347    Anti-Coagulation No results for input(s): INR in the last 168 hours.  Discharge Instructions    (HEART FAILURE PATIENTS) Call MD:  Anytime you have any of the following symptoms: 1) 3 pound weight gain in 24 hours or 5 pounds in 1 week 2) shortness of breath, with or without a dry hacking cough 3) swelling in the hands, feet or stomach 4) if you have to sleep on extra pillows at night in order to breathe.    Complete by:  As directed      Call MD for:  difficulty breathing, headache or visual disturbances    Complete by:  As directed      Call MD for:  extreme fatigue    Complete by:  As directed      Call MD for:  hives    Complete by:  As directed      Call MD for:  persistant dizziness or light-headedness    Complete by:  As directed      Call MD for:  persistant nausea and vomiting    Complete by:  As directed      Call MD for:  severe uncontrolled pain    Complete by:  As directed      Call MD for:  temperature >100.4    Complete by:  As directed      Diet - low sodium heart healthy    Complete by:  As directed      Discharge instructions    Complete by:  As directed   1.  Review your medications carefully as they have changed 2.  Follow up with Dr. Elsworth Soho as scheduled 3.  Complete all of you antibiotics as instructed 4.  Slow prednisone taper to 10 mg daily 5.  Wear your CPAP every night 6.  Take Lasix (water pill) daily as needed per instructions     Increase activity slowly    Complete by:  As directed                 Follow-up Information    Follow up with  Collene Gobble., MD On  12/11/2013.   Specialty:  Pulmonary Disease   Why:  Appt at 4:00 PM   Contact information:   [address] [address] [phone]       Follow up with DENIZARD-THOMPSON, Izora Gala, MD. Schedule an appointment as soon as possible for a visit in 1 week.   Specialty:  Internal Medicine   Contact information:   [place] [address] [phone]          Medication List    STOP taking these medications        Influenza vac split quadrivalent PF 0.5 ML injection  Commonly known as:  FLUARIX      TAKE these medications        albuterol (2.5 MG/3ML) 0.083% nebulizer solution  Commonly known as:  PROVENTIL  Take 2.5 mg by nebulization every 4 (four) hours as needed for wheezing or shortness of breath.     albuterol 108 (90 BASE) MCG/ACT inhaler  Commonly known as:  PROAIR HFA  Inhale 2 puffs into the lungs every 6 (six) hours as needed for wheezing or shortness of breath.     budesonide-formoterol 160-4.5 MCG/ACT inhaler  Commonly known as:  SYMBICORT  Inhale 2 puffs into the lungs 2 (two) times daily.     calcium-vitamin D 500-200 MG-UNIT per tablet  Commonly known as:  OSCAL WITH D  Take 1 tablet by mouth every morning.     cholecalciferol 1000 UNITS tablet  Commonly known as:  VITAMIN D  Take 1,000 Units by mouth every morning.     cyclobenzaprine 10 MG tablet  Commonly known as:  FLEXERIL  Take 10 mg by mouth 3 (three) times daily as needed for muscle spasms.     diltiazem 30 MG tablet  Commonly known as:  CARDIZEM  Take 1 tablet (30 mg total) by mouth every 8 (eight) hours.     diphenhydrAMINE 25 MG tablet  Commonly known as:  BENADRYL  Take 25 mg by mouth every 6 (six) hours as needed for allergies.     famotidine 20 MG tablet  Commonly known as:  PEPCID  Take 1 tablet (20 mg total) by mouth 2 (two) times daily.     ferrous sulfate 325 (65 FE) MG tablet  Take 1 tablet (325 mg total) by  mouth 2 (two) times daily with a meal.     furosemide 20 MG tablet  Commonly known as:  LASIX  Take 1 tablet (20 mg total) by mouth daily as needed for fluid or edema (Take if > 3lb weight gain in 24 hours or 5 lbs in 1 week,  or increased swelling in lower extremities).     guaiFENesin 600 MG 12 hr tablet  Commonly known as:  MUCINEX  Take 1,200 mg by mouth 2 (two) times daily as needed (For congestion.).     levofloxacin 750 MG tablet  Commonly known as:  LEVAQUIN  Take 1 tablet (750 mg total) by mouth daily.     lidocaine 5 %  Commonly known as:  LIDODERM  Place 1 patch onto the skin daily as needed (For pain.).     metoprolol 50 MG tablet  Commonly known as:  LOPRESSOR  Take 1 tablet (50 mg total) by mouth 2 (two) times daily.     multivitamin with minerals tablet  Take 1 tablet by mouth every morning.     predniSONE 10 MG tablet  Commonly known as:  DELTASONE  3  tabs daily for 4 days, then 2 tabs daily for 4 days, then 1 tab daily     pregabalin 200 MG capsule  Commonly known as:  LYRICA  Take 200 mg by mouth 2 (two) times daily as needed (For pain.).     promethazine 25 MG tablet  Commonly known as:  PHENERGAN  Take 1 tablet (25 mg total) by mouth every 6 (six) hours as needed for Nausea.     sodium chloride 0.65 % Soln nasal spray  Commonly known as:  OCEAN  Place 1 spray into both nostrils as needed for congestion.     traMADol 50 MG tablet  Commonly known as:  ULTRAM  Take 1 tablet (50 mg total) by mouth every 6 (six) hours as needed.          Disposition: Home.  Continue O2 at 2-3L per [place].  No new home health needs identified prior to discharge.   Discharged Condition: [name] has met maximum benefit of inpatient care and is medically stable and cleared for discharge.  Patient is pending follow up as above.      Time spent on disposition:  Greater than 35 minutes.   Signed: Noe Gens, NP-C [facility] Pgr:  [phone] Office: [phone]    Chesley Mires, MD [place] Pager:  [phone] After 3pm call: [phone]

## 2013-12-11 ENCOUNTER — Encounter: Payer: Self-pay | Admitting: Emergency Medicine

## 2013-12-11 ENCOUNTER — Ambulatory Visit (INDEPENDENT_AMBULATORY_CARE_PROVIDER_SITE_OTHER): Payer: 59 | Admitting: Emergency Medicine

## 2013-12-11 VITALS — BP 118/70 | HR 114 | Ht 67.5 in | Wt 183.6 lb

## 2013-12-11 DIAGNOSIS — D869 Sarcoidosis, unspecified: Secondary | ICD-10-CM

## 2013-12-11 DIAGNOSIS — J479 Bronchiectasis, uncomplicated: Secondary | ICD-10-CM

## 2013-12-11 DIAGNOSIS — G4733 Obstructive sleep apnea (adult) (pediatric): Secondary | ICD-10-CM

## 2013-12-11 DIAGNOSIS — Z9989 Dependence on other enabling machines and devices: Secondary | ICD-10-CM

## 2013-12-11 MED ORDER — HYDROCODONE-HOMATROPINE 5-1.5 MG/5ML PO SYRP: 5.0000 mL | ORAL_SOLUTION | Freq: Four times a day (QID) | ORAL | Status: DC | PRN

## 2013-12-11 NOTE — Patient Instructions (Signed)
Please continue your Symbicort and albuterol as you have been taking them Wearing your oxygen at all times Weaned your prednisone down to 10 mg daily as planned and then stay on this dose until our next visit. Do not weaned down to zero Follow with Dr Delton CoombesByrum in 1 month

## 2013-12-11 NOTE — Assessment & Plan Note (Signed)
Recent exacerbation, improved but clearly exacerbates frequently

## 2013-12-11 NOTE — Progress Notes (Signed)
Subjective:   Patient ID: [name], male    DOB: 04/02/1965, 48 y.o.   MRN: [id number]  HPI 48 yo man, never smoker, hx sarcoidosis dx by transbronchial bx's in 2002 and again in 2011 or 2012, also OSA on CPAP. He has occasional flares that sound asthmatic in nature, gets treated with prednisone. He flares typically a few times a year, but he has also gone for over a year without an exacerbation. He has never been on everyday meds, has been on pred with exacerbations - longest he was on it was 90 days. He has also been on a steroid-sparing agent before (?MTX), but it was stopped. He has a large cavity in R lung, scattered infiltrates. He has been on Advair before, Spiriva before, not currently. Has albuterol available prn, uses almost every day. [facility]   ROV 01/06/12 -- sarcoidosis, R lung cavitary disease. Was recently hospitalized for an acute exacerbation. D/c after pred taper. Last CT scan was 4/13. He is using nebs 2 -3 x a day.   04/06/12  Post Hospital follow up  Patient presents for a post hospital followup. Patient was recently admitted with acute bronchiectasis, exacerbation, complicated by underlying sarcoidosis. Patient has had several recent hospitalizations and emergency room visits.   He was treated with aggressive pulmonary hygiene, antibiotics, and a steroid taper. Since discharge pt reports breathing is doing well overall but still wheezing, SOB, head congestion and prod cough with clear  Mucus .  Patient denies any hemoptysis, orthopnea, PND, or leg swelling. During admission. Patient has had cocaine, positive urine drug screen in his past. He did receive a social work consult. Patient denies any drug use at today's visit.  He was discharged on prednisone to recommend to hold at 20 mg however, patient misunderstood instructions and has stopped all steroids.  ROV 05/18/12 -- sarcoidosis, bronchiectasis and R cavitary disease. Regular f/u visit. Has  been doing well, some intermittent SOB. Taking allegra and benadryl. He is on QVAR daily, off prednisone. Uses SABA rarely. He is reliable w CPAP.   ROV 06/27/12 -- sarcoidosis, bronchiectasis and R cavitary disease. Also OSA, HTN w diastolic dysfxn.  Was admitted for flare obstructive lung disease/sarcoidosis 6/6 - 6/8. He underwent repeat CT scan chest today >> stable bullous and parenchymal changes compared with 02/2012. He feels back to baseline clinically. Having some cough, clear phlegm. On flonase, allegra and benadryl.   post hospital followup. Patient returns for a post hospital followup. Patient had a complicated hospital course, which he was admitted June 30, and discharged on 08/04/2012. Patient was admitted with acute respiratory failure with a left tension pneumothorax requiring mechanical ventilation. Patient had a persistent left pneumothorax with subcutaneous emphysema around his chest tube. He was seen by thoracic surgery and underwent a left video-assisted thoracoscopy on July 7 with stapling of apical blebs and mechanical thoracentesis. Pt cont w/ dyspnea/fevers.  CT scan on July 20 showed a, moderate left pleural effusion with loculation anteriorly/? HCAP >Underwent thoracentesis was done with 90 cc removed. Analysis was consistent with an exudative effusion and his culture data was negative. He was treated with abx and discharged on Levaquin to complete a ten-day course. And steroid taper-which he has a  few days left.  He was seen by thoracic surgery yesterday.chest x-ray showed a mild right apical pneumothorax. That is unchanged from previous exam.   Since discharge he is feeling better but still very weak.  Feels  breathing is  doing well overall.  does report some pain at the incision site and in the left lung.  finished levaquin yesterday, still taking prednisone at 3tabs daily. No hemoptysis , orthopnea or edema.   ROV 09/15/12 - sarcoidosis, bronchiectasis and R cavitary disease,  complicated hospitalization for PTX as above. Hasn't been using his CPAP in order to avoid positive pressure following his pneumothorax. He was seen 8/21 and treated with levofloxacin and prednisone for bronchitis versus pneumonia. He needs his portable concentrator, [employer] doesn't provide. [facility] also [facility], [facility]. Uses albuterol prn, about once a day. Pulmicort bid.   ROV 01/17/13 -- sarcoidosis, bronchiectasis and R cavitary disease, OSA back on CPAP. Hx PTX on L. He began to have an exacerbation end of Dec > treated with pred taper and now improved. He is using albuterol nebs prn, pulmicort nebs bid.   ROV 02/28/13 -- sarcoidosis, bronchiectasis and R cavitary disease, OSA back on CPAP. I have sent him for transplant eval, not a candidate due to positive drug screen for cocaine. He presents today with more dyspnea. Chills for 3-4 days. He has cough with yellow/green.   ROV 03/28/13 -- sarcoidosis, bronchiectasis and R cavitary disease. OSA on CPAP. He has some numbness and pain in his L chest and flank. His breathing and coughing are better. He is interested in changing to inhalers instead of nebs.    Acute OV 05/15/13  Complains of increased  SOB, wheezing, tightness, prod cough with yellow mucus x3 days.  Denies f/c/s, hemoptysis, nausea, vomiting, orthopnea, edema , or rash, chest pain  No recent travel or abx use.  Remains on Symbicort Twice daily  Off all steroids .  Says he has not used cocaine >3 months, drug counseling given.  CXR today with no acute process., chronic changes   ROV 10/12/13 -- hx of sarcoidosis, bronchiectasis and R cavitary disease, OSA back on CPAP. Was [facility] by [facility] in May and [facility] July as above. He has been treated for AE on multiple occasions since our last visit (more on than off). Returns for f/u.   ROV / Hospital f/u 12/11/13 -- hx of sarcoidosis, bronchiectasis and R cavitary disease, OSA back on CPAP. Currently on  symbicort + albuterol averages 2-3x a day. Exacerbates frequently. He is having bloating that causes restriction and limits his breathing. Has several BM a day, sometimes diarrhea    Objective:  Physical Exam  Filed Vitals:   12/11/13 1620  BP: 118/70  Pulse: 114  Height: 5' 7.5" (1.715 m)  Weight: 183 lb 9.6 oz (83.28 kg)  SpO2: 94%    Gen: Pleasant,  normal affect, NAD  ENT: No lesions,  mouth clear,  oropharynx clear, no postnasal drip  Neck: No JVD, no TMG, no carotid bruits  Lungs:  Diminshed BS in bases, no wheezes  Cardiovascular: RRR, heart sounds normal, no murmur or gallops, no peripheral edema  Musculoskeletal: No deformities, no cyanosis or clubbing  Neuro: alert, non focal  Skin: Warm, no lesions or rashes     Assessment & Plan:  Bronchiectasis without acute exacerbation Recent exacerbation, improved but clearly exacerbates frequently   OSA on CPAP Continue CPAP  Sarcoid - continue symbicort - will continue the pred wean down to 10mg  daily and stay at that dose.  - rov 1 - consider repeat Ct scan in the future.

## 2013-12-11 NOTE — Assessment & Plan Note (Signed)
-   continue symbicort - will continue the pred wean down to 10mg  daily and stay at that dose.  - rov 1 - consider repeat Ct scan in the future.

## 2013-12-11 NOTE — Assessment & Plan Note (Signed)
Continue CPAP.  

## 2014-01-09 ENCOUNTER — Inpatient Hospital Stay (HOSPITAL_COMMUNITY): Payer: 59

## 2014-01-09 ENCOUNTER — Emergency Department (HOSPITAL_COMMUNITY): Payer: 59

## 2014-01-09 ENCOUNTER — Inpatient Hospital Stay (HOSPITAL_COMMUNITY)
Admission: EM | Admit: 2014-01-09 | Discharge: 2014-01-16 | DRG: 865 | Disposition: A | Discharge status: Home Health | Payer: 59 | Attending: Pulmonary Disease | Admitting: Pulmonary Disease

## 2014-01-09 ENCOUNTER — Encounter (HOSPITAL_COMMUNITY): Payer: Self-pay

## 2014-01-09 DIAGNOSIS — D869 Sarcoidosis, unspecified: Secondary | ICD-10-CM | POA: Diagnosis present

## 2014-01-09 DIAGNOSIS — Z7952 Long term (current) use of systemic steroids: Secondary | ICD-10-CM | POA: Diagnosis not present

## 2014-01-09 DIAGNOSIS — K5900 Constipation, unspecified: Secondary | ICD-10-CM | POA: Diagnosis present

## 2014-01-09 DIAGNOSIS — G709 Myoneural disorder, unspecified: Secondary | ICD-10-CM | POA: Diagnosis present

## 2014-01-09 DIAGNOSIS — I10 Essential (primary) hypertension: Secondary | ICD-10-CM | POA: Diagnosis present

## 2014-01-09 DIAGNOSIS — J9620 Acute and chronic respiratory failure, unspecified whether with hypoxia or hypercapnia: Secondary | ICD-10-CM | POA: Diagnosis present

## 2014-01-09 DIAGNOSIS — R140 Abdominal distension (gaseous): Secondary | ICD-10-CM

## 2014-01-09 DIAGNOSIS — B338 Other specified viral diseases: Secondary | ICD-10-CM

## 2014-01-09 DIAGNOSIS — Z881 Allergy status to other antibiotic agents status: Secondary | ICD-10-CM | POA: Diagnosis not present

## 2014-01-09 DIAGNOSIS — J9690 Respiratory failure, unspecified, unspecified whether with hypoxia or hypercapnia: Secondary | ICD-10-CM

## 2014-01-09 DIAGNOSIS — J441 Chronic obstructive pulmonary disease with (acute) exacerbation: Secondary | ICD-10-CM | POA: Diagnosis present

## 2014-01-09 DIAGNOSIS — G4733 Obstructive sleep apnea (adult) (pediatric): Secondary | ICD-10-CM | POA: Diagnosis present

## 2014-01-09 DIAGNOSIS — J9621 Acute and chronic respiratory failure with hypoxia: Secondary | ICD-10-CM | POA: Diagnosis present

## 2014-01-09 DIAGNOSIS — B974 Respiratory syncytial virus as the cause of diseases classified elsewhere: Secondary | ICD-10-CM | POA: Diagnosis present

## 2014-01-09 DIAGNOSIS — J479 Bronchiectasis, uncomplicated: Secondary | ICD-10-CM | POA: Diagnosis present

## 2014-01-09 DIAGNOSIS — Z79899 Other long term (current) drug therapy: Secondary | ICD-10-CM

## 2014-01-09 DIAGNOSIS — R0902 Hypoxemia: Secondary | ICD-10-CM | POA: Insufficient documentation

## 2014-01-09 DIAGNOSIS — J9601 Acute respiratory failure with hypoxia: Secondary | ICD-10-CM

## 2014-01-09 DIAGNOSIS — Z9289 Personal history of other medical treatment: Secondary | ICD-10-CM

## 2014-01-09 DIAGNOSIS — J45909 Unspecified asthma, uncomplicated: Secondary | ICD-10-CM | POA: Diagnosis present

## 2014-01-09 LAB — COMPREHENSIVE METABOLIC PANEL
ALK PHOS: 87 U/L (ref 39–117)
ALT: 19 U/L (ref 0–53)
ALT: 19 U/L (ref 0–53)
ANION GAP: 8 (ref 5–15)
ANION GAP: 12 (ref 5–15)
AST: 32 U/L (ref 0–37)
AST: 42 U/L — ABNORMAL HIGH (ref 0–37)
Albumin: 4.6 g/dL (ref 3.5–5.2)
Albumin: 4 g/dL (ref 3.5–5.2)
Alkaline Phosphatase: 97 U/L (ref 39–117)
BILIRUBIN TOTAL: 0.5 mg/dL (ref 0.3–1.2)
BILIRUBIN TOTAL: 0.4 mg/dL (ref 0.3–1.2)
BUN: 15 mg/dL (ref 6–23)
BUN: 16 mg/dL (ref 6–23)
CO2: 30 mmol/L (ref 19–32)
CO2: 25 mmol/L (ref 19–32)
CREATININE: 0.84 mg/dL (ref 0.50–1.35)
Calcium: 9.4 mg/dL (ref 8.4–10.5)
Calcium: 8.7 mg/dL (ref 8.4–10.5)
Chloride: 99 mEq/L (ref 96–112)
Chloride: 100 mEq/L (ref 96–112)
Creatinine, Ser: 0.97 mg/dL (ref 0.50–1.35)
GFR calc Af Amer: >90 mL/min (ref >90)
GFR calc Af Amer: >90 mL/min (ref >90)
Glucose, Bld: 87 mg/dL (ref 70–99)
Glucose, Bld: 170 mg/dL — ABNORMAL HIGH (ref 70–99)
Potassium: 3.8 mmol/L (ref 3.5–5.1)
Potassium: 4 mmol/L (ref 3.5–5.1)
SODIUM: 137 mmol/L (ref 135–145)
Sodium: 137 mmol/L (ref 135–145)
Total Protein: 8.3 g/dL (ref 6.0–8.3)
Total Protein: 7.5 g/dL (ref 6.0–8.3)

## 2014-01-09 LAB — CBC WITH DIFFERENTIAL/PLATELET
BASOS ABS: 0 10*3/uL (ref 0.0–0.1)
Basophils Absolute: 0 10*3/uL (ref 0.0–0.1)
Basophils Relative: 0 % (ref 0–1)
Basophils Relative: 0 % (ref 0–1)
EOS ABS: 0 10*3/uL (ref 0.0–0.7)
EOS PCT: 0 % (ref 0–5)
Eosinophils Absolute: 0 10*3/uL (ref 0.0–0.7)
Eosinophils Relative: 0 % (ref 0–5)
HCT: 39.4 % (ref 39.0–52.0)
HCT: 36.5 % — ABNORMAL LOW (ref 39.0–52.0)
Hemoglobin: 12.1 g/dL — ABNORMAL LOW (ref 13.0–17.0)
Hemoglobin: 11.5 g/dL — ABNORMAL LOW (ref 13.0–17.0)
LYMPHS ABS: 0.7 10*3/uL (ref 0.7–4.0)
LYMPHS PCT: 6 % — AB (ref 12–46)
Lymphocytes Relative: 2 % — ABNORMAL LOW (ref 12–46)
Lymphs Abs: 0.2 10*3/uL — ABNORMAL LOW (ref 0.7–4.0)
MCH: 26.2 pg (ref 26.0–34.0)
MCH: 27.1 pg (ref 26.0–34.0)
MCHC: 30.7 g/dL (ref 30.0–36.0)
MCHC: 31.5 g/dL (ref 30.0–36.0)
MCV: 85.5 fL (ref 78.0–100.0)
MCV: 86.1 fL (ref 78.0–100.0)
MONO ABS: 0.2 10*3/uL (ref 0.1–1.0)
MONOS PCT: 2 % — AB (ref 3–12)
Monocytes Absolute: 0.8 10*3/uL (ref 0.1–1.0)
Monocytes Relative: 8 % (ref 3–12)
NEUTROS ABS: 8.8 10*3/uL — AB (ref 1.7–7.7)
NEUTROS PCT: 86 % — AB (ref 43–77)
Neutro Abs: 11.3 10*3/uL — ABNORMAL HIGH (ref 1.7–7.7)
Neutrophils Relative %: 96 % — ABNORMAL HIGH (ref 43–77)
PLATELETS: 285 10*3/uL (ref 150–400)
PLATELETS: 252 10*3/uL (ref 150–400)
RBC: 4.61 MIL/uL (ref 4.22–5.81)
RBC: 4.24 MIL/uL (ref 4.22–5.81)
RDW: 13.6 % (ref 11.5–15.5)
RDW: 13.9 % (ref 11.5–15.5)
WBC: 10.4 10*3/uL (ref 4.0–10.5)
WBC: 11.7 10*3/uL — ABNORMAL HIGH (ref 4.0–10.5)

## 2014-01-09 LAB — URINALYSIS, ROUTINE W REFLEX MICROSCOPIC
Bilirubin Urine: NEGATIVE
Glucose, UA: 250 mg/dL — AB
HGB URINE DIPSTICK: NEGATIVE
KETONES UR: NEGATIVE mg/dL
Leukocytes, UA: NEGATIVE
Nitrite: NEGATIVE
PROTEIN: NEGATIVE mg/dL
Specific Gravity, Urine: 1.023 (ref 1.005–1.030)
Urobilinogen, UA: 0.2 mg/dL (ref 0.0–1.0)
pH: 5.5 (ref 5.0–8.0)

## 2014-01-09 LAB — LACTIC ACID, PLASMA: LACTIC ACID, VENOUS: 4.5 mmol/L — AB (ref 0.5–2.2)

## 2014-01-09 LAB — APTT: APTT: 31 s (ref 24–37)

## 2014-01-09 LAB — BRAIN NATRIURETIC PEPTIDE: B Natriuretic Peptide: 44.9 pg/mL (ref 0.0–100.0)

## 2014-01-09 LAB — BLOOD GAS, ARTERIAL
Acid-Base Excess: 2.6 mmol/L — ABNORMAL HIGH (ref 0.0–2.0)
Bicarbonate: 28.1 mEq/L — ABNORMAL HIGH (ref 20.0–24.0)
DRAWN BY: 331471
O2 CONTENT: 8 L/min
O2 Saturation: 96.7 %
PCO2 ART: 49.8 mmHg — AB (ref 35.0–45.0)
PH ART: 7.37 (ref 7.350–7.450)
Patient temperature: 98.6
TCO2: 25.3 mmol/L (ref 0–100)
pO2, Arterial: 129 mmHg — ABNORMAL HIGH (ref 80.0–100.0)

## 2014-01-09 LAB — PROTIME-INR
INR: 1.03 (ref 0.00–1.49)
PROTHROMBIN TIME: 13.6 s (ref 11.6–15.2)

## 2014-01-09 LAB — MRSA PCR SCREENING: MRSA by PCR: NEGATIVE

## 2014-01-09 LAB — I-STAT CG4 LACTIC ACID, ED: LACTIC ACID, VENOUS: 2.12 mmol/L (ref 0.5–2.2)

## 2014-01-09 MED ORDER — BUDESONIDE 0.25 MG/2ML IN SUSP
0.2500 mg | Freq: Four times a day (QID) | RESPIRATORY_TRACT | Status: DC
  Administered 2014-01-09: 0.25 mg via RESPIRATORY_TRACT
  Filled 2014-01-09: qty 2

## 2014-01-09 MED ORDER — CALCIUM CARBONATE-VITAMIN D 500-200 MG-UNIT PO TABS
1.0000 | ORAL_TABLET | Freq: Every day | ORAL | Status: DC
  Administered 2014-01-10 – 2014-01-16 (×7): 1 via ORAL
  Filled 2014-01-09 (×9): qty 1

## 2014-01-09 MED ORDER — DILTIAZEM HCL 30 MG PO TABS
30.0000 mg | ORAL_TABLET | Freq: Three times a day (TID) | ORAL | Status: DC
  Administered 2014-01-09 – 2014-01-16 (×20): 30 mg via ORAL
  Filled 2014-01-09 (×27): qty 1

## 2014-01-09 MED ORDER — HYDROMORPHONE HCL 1 MG/ML IJ SOLN
1.0000 mg | Freq: Once | INTRAMUSCULAR | Status: AC
Start: 2014-01-09 — End: 2014-01-09
  Administered 2014-01-09: 1 mg via INTRAVENOUS
  Filled 2014-01-09: qty 1

## 2014-01-09 MED ORDER — ENOXAPARIN SODIUM 40 MG/0.4ML ~~LOC~~ SOLN
40.0000 mg | Freq: Every day | SUBCUTANEOUS | Status: DC
  Administered 2014-01-09 – 2014-01-15 (×7): 40 mg via SUBCUTANEOUS
  Filled 2014-01-09 (×8): qty 0.4

## 2014-01-09 MED ORDER — FERROUS SULFATE 325 (65 FE) MG PO TABS
325.0000 mg | ORAL_TABLET | Freq: Two times a day (BID) | ORAL | Status: DC
  Administered 2014-01-09 – 2014-01-16 (×14): 325 mg via ORAL
  Filled 2014-01-09 (×18): qty 1

## 2014-01-09 MED ORDER — IPRATROPIUM BROMIDE 0.02 % IN SOLN
1.0000 mg | Freq: Once | RESPIRATORY_TRACT | Status: AC
  Administered 2014-01-09: 1 mg via RESPIRATORY_TRACT
  Filled 2014-01-09: qty 5

## 2014-01-09 MED ORDER — METHYLPREDNISOLONE SODIUM SUCC 125 MG IJ SOLR
125.0000 mg | Freq: Once | INTRAMUSCULAR | Status: AC
  Administered 2014-01-09: 125 mg via INTRAVENOUS
  Filled 2014-01-09: qty 2

## 2014-01-09 MED ORDER — FENTANYL CITRATE 0.05 MG/ML IJ SOLN
50.0000 ug | Freq: Once | INTRAMUSCULAR | Status: AC
  Administered 2014-01-09: 50 ug via INTRAVENOUS
  Filled 2014-01-09: qty 2

## 2014-01-09 MED ORDER — METOPROLOL TARTRATE 50 MG PO TABS
50.0000 mg | ORAL_TABLET | Freq: Two times a day (BID) | ORAL | Status: DC
  Administered 2014-01-09 – 2014-01-16 (×14): 50 mg via ORAL
  Filled 2014-01-09: qty 1
  Filled 2014-01-09 (×6): qty 2
  Filled 2014-01-09 (×3): qty 1
  Filled 2014-01-09: qty 2
  Filled 2014-01-09 (×3): qty 1
  Filled 2014-01-09: qty 2

## 2014-01-09 MED ORDER — PREGABALIN 100 MG PO CAPS: 200.0000 mg | ORAL_CAPSULE | Freq: Two times a day (BID) | ORAL | Status: DC | PRN

## 2014-01-09 MED ORDER — ADULT MULTIVITAMIN W/MINERALS CH
1.0000 | ORAL_TABLET | Freq: Every day | ORAL | Status: DC
  Administered 2014-01-10 – 2014-01-16 (×7): 1 via ORAL
  Filled 2014-01-09 (×7): qty 1

## 2014-01-09 MED ORDER — FAMOTIDINE 20 MG PO TABS
20.0000 mg | ORAL_TABLET | Freq: Two times a day (BID) | ORAL | Status: DC
  Administered 2014-01-09 – 2014-01-16 (×14): 20 mg via ORAL
  Filled 2014-01-09 (×15): qty 1

## 2014-01-09 MED ORDER — ALBUTEROL (5 MG/ML) CONTINUOUS INHALATION SOLN
10.0000 mg/h | INHALATION_SOLUTION | Freq: Once | RESPIRATORY_TRACT | Status: AC
  Administered 2014-01-09: 10 mg/h via RESPIRATORY_TRACT

## 2014-01-09 MED ORDER — MULTI-VITAMIN/MINERALS PO TABS: 1.0000 | ORAL_TABLET | Freq: Every morning | ORAL | Status: DC

## 2014-01-09 MED ORDER — LEVALBUTEROL HCL 0.63 MG/3ML IN NEBU
0.6300 mg | INHALATION_SOLUTION | RESPIRATORY_TRACT | Status: DC | PRN
  Administered 2014-01-11 – 2014-01-16 (×5): 0.63 mg via RESPIRATORY_TRACT
  Filled 2014-01-09 (×5): qty 3

## 2014-01-09 MED ORDER — CYCLOBENZAPRINE HCL 10 MG PO TABS
10.0000 mg | ORAL_TABLET | Freq: Three times a day (TID) | ORAL | Status: DC | PRN
  Administered 2014-01-10 – 2014-01-14 (×9): 10 mg via ORAL
  Filled 2014-01-09 (×10): qty 1

## 2014-01-09 MED ORDER — SODIUM CHLORIDE 0.9 % IV BOLUS (SEPSIS)
1000.0000 mL | Freq: Once | INTRAVENOUS | Status: AC
  Administered 2014-01-09: 1000 mL via INTRAVENOUS

## 2014-01-09 MED ORDER — VANCOMYCIN HCL IN DEXTROSE 1-5 GM/200ML-% IV SOLN
1000.0000 mg | Freq: Three times a day (TID) | INTRAVENOUS | Status: DC
  Administered 2014-01-09 – 2014-01-11 (×6): 1000 mg via INTRAVENOUS
  Filled 2014-01-09 (×6): qty 200

## 2014-01-09 MED ORDER — MORPHINE SULFATE 2 MG/ML IJ SOLN
2.0000 mg | INTRAMUSCULAR | Status: DC | PRN
  Administered 2014-01-10 – 2014-01-14 (×13): 2 mg via INTRAVENOUS
  Filled 2014-01-09 (×13): qty 1

## 2014-01-09 MED ORDER — ALBUTEROL (5 MG/ML) CONTINUOUS INHALATION SOLN
10.0000 mg/h | INHALATION_SOLUTION | Freq: Once | RESPIRATORY_TRACT | Status: AC
  Administered 2014-01-09: 10 mg/h via RESPIRATORY_TRACT
  Filled 2014-01-09: qty 20

## 2014-01-09 MED ORDER — PIPERACILLIN-TAZOBACTAM 3.375 G IVPB
3.3750 g | Freq: Three times a day (TID) | INTRAVENOUS | Status: DC
  Administered 2014-01-09 – 2014-01-11 (×5): 3.375 g via INTRAVENOUS
  Filled 2014-01-09 (×5): qty 50

## 2014-01-09 MED ORDER — GUAIFENESIN-DM 100-10 MG/5ML PO SYRP
5.0000 mL | ORAL_SOLUTION | ORAL | Status: DC | PRN
  Administered 2014-01-09 – 2014-01-16 (×7): 5 mL via ORAL
  Filled 2014-01-09 (×7): qty 10

## 2014-01-09 MED ORDER — FUROSEMIDE 20 MG PO TABS
20.0000 mg | ORAL_TABLET | Freq: Every day | ORAL | Status: DC | PRN
Start: 2014-01-09 — End: 2014-01-11

## 2014-01-09 MED ORDER — IPRATROPIUM-ALBUTEROL 0.5-2.5 (3) MG/3ML IN SOLN
3.0000 mL | Freq: Four times a day (QID) | RESPIRATORY_TRACT | Status: DC
  Administered 2014-01-09 – 2014-01-14 (×17): 3 mL via RESPIRATORY_TRACT
  Filled 2014-01-09 (×18): qty 3

## 2014-01-09 MED ORDER — METHYLPREDNISOLONE SODIUM SUCC 125 MG IJ SOLR
60.0000 mg | Freq: Four times a day (QID) | INTRAMUSCULAR | Status: DC
  Administered 2014-01-09 – 2014-01-13 (×15): 60 mg via INTRAVENOUS
  Filled 2014-01-09 (×15): qty 2

## 2014-01-09 MED ORDER — PIPERACILLIN-TAZOBACTAM 3.375 G IVPB 30 MIN
3.3750 g | Freq: Once | INTRAVENOUS | Status: AC
  Administered 2014-01-09: 3.375 g via INTRAVENOUS
  Filled 2014-01-09: qty 50

## 2014-01-09 MED ORDER — GUAIFENESIN ER 600 MG PO TB12
1200.0000 mg | ORAL_TABLET | Freq: Two times a day (BID) | ORAL | Status: DC | PRN
  Administered 2014-01-10 – 2014-01-11 (×4): 1200 mg via ORAL
  Filled 2014-01-09 (×4): qty 2

## 2014-01-09 MED ORDER — HYDROMORPHONE HCL 1 MG/ML IJ SOLN
1.0000 mg | Freq: Once | INTRAMUSCULAR | Status: AC
  Administered 2014-01-09: 1 mg via INTRAVENOUS
  Filled 2014-01-09: qty 1

## 2014-01-09 MED ORDER — SODIUM CHLORIDE 0.9 % IV SOLN
INTRAVENOUS | Status: DC
  Administered 2014-01-09 (×2): via INTRAVENOUS

## 2014-01-09 NOTE — ED Notes (Signed)
He is markedly short of breath, and is initially in tripod position.  He tells me he has hx of sarcoidosis; and has had occasional chills for past 2-3 days.  He arrives on his usual dose of continuous home o2, which we maintain.  He is moderately diaphoretic on arrival also.  He denies any pain.

## 2014-01-09 NOTE — H&P (Signed)
Name: [name] MRN: [id number] DOB: 10/19/1965    ADMISSION DATE:  01/09/2014   REFERRING MD :  [name]   CHIEF COMPLAINT:  Respiratory failure   BRIEF PATIENT DESCRIPTION:  48yo male with hx sarcoidosis (dx by tbbx in 2002), OSA on CPAP +/- ?asthma.  Presented 12/30 with c/o SOB since 12/25 as well as cough, non productive.  No fevers, chills.  Just seen as outpt (RB) and given abx and pred taper without improvement.  Worsening of SOB over last few days.  Was extremely dyspneic on arrival to ER and placed on bipap, improved quickly, no further bipap needed.  PCCM called to admit.   SIGNIFICANT EVENTS    STUDIES:  2D echo 12/30>>>   HISTORY OF PRESENT ILLNESS: 48yo male with hx sarcoidosis (dx by tbbx in 2002), OSA on CPAP +/- ?asthma.  Presented 12/30 with c/o SOB since 12/25 as well as cough, non productive.  No fevers, chills.  Just seen as outpt (RB) and given abx and pred taper without improvement.  Worsening of SOB over last few days.  Was extremely dyspneic on arrival to ER and placed on bipap, improved quickly, no further bipap needed.  PCCM called to admit.   PAST MEDICAL HISTORY :   has a past medical history of Hypertension; Sarcoidosis; OSA on CPAP; Chronic respiratory failure; Bronchitis; Pneumonia; COPD (chronic obstructive pulmonary disease); and Neuromuscular disorder.  has past surgical history that includes Rotator cuff repair; arm surgery ; and Video assisted thoracoscopy (vats)/thorocotomy (Left, 07/17/2012). Prior to Admission medications   Medication Sig Start Date End Date Taking? Authorizing Provider  albuterol (PROAIR HFA) 108 (90 BASE) MCG/ACT inhaler Inhale 2 puffs into the lungs every 6 (six) hours as needed for wheezing or shortness of breath. 03/28/13  Yes Leslye Peerobert S Byrum, MD  albuterol (PROVENTIL) (2.5 MG/3ML) 0.083% nebulizer solution Take 2.5 mg by nebulization every 4 (four) hours as needed for wheezing or shortness of breath.  06/08/12  Yes Historical  Provider, MD  budesonide-formoterol (SYMBICORT) 160-4.5 MCG/ACT inhaler Inhale 2 puffs into the lungs 2 (two) times daily. 11/28/13  Yes Jeanella CrazeBrandi L Ollis, NP  calcium-vitamin D (OSCAL WITH D) 500-200 MG-UNIT per tablet Take 1 tablet by mouth every morning.   Yes Historical Provider, MD  cholecalciferol (VITAMIN D) 1000 UNITS tablet Take 1,000 Units by mouth every morning.    Yes Historical Provider, MD  cyclobenzaprine (FLEXERIL) 10 MG tablet Take 10 mg by mouth 3 (three) times daily as needed for muscle spasms.    Yes Historical Provider, MD  diltiazem (CARDIZEM) 30 MG tablet Take 1 tablet (30 mg total) by mouth every 8 (eight) hours. 11/28/13  Yes Jeanella CrazeBrandi L Ollis, NP  diphenhydrAMINE (BENADRYL) 25 MG tablet Take 25 mg by mouth every 6 (six) hours as needed for allergies.    Yes Historical Provider, MD  famotidine (PEPCID) 20 MG tablet Take 1 tablet (20 mg total) by mouth 2 (two) times daily. 03/08/12  Yes Renae FickleMackenzie Short, MD  ferrous sulfate 325 (65 FE) MG tablet Take 1 tablet (325 mg total) by mouth 2 (two) times daily with a meal. 06/18/12  Yes Leroy SeaPrashant K Singh, MD  furosemide (LASIX) 20 MG tablet Take 1 tablet (20 mg total) by mouth daily as needed for fluid or edema (Take if > 3lb weight gain in 24 hours or 5 lbs in 1 week,  or increased swelling in lower extremities). 11/28/13  Yes Jeanella CrazeBrandi L Ollis, NP  guaiFENesin (MUCINEX) 600 MG 12 hr tablet  Take 1,200 mg by mouth 2 (two) times daily as needed (For congestion.).    Yes Historical Provider, MD  HYDROcodone-homatropine (HYCODAN) 5-1.5 MG/5ML syrup Take 5 mLs by mouth every 6 (six) hours as needed for cough. 12/11/13  Yes Leslye Peerobert S Byrum, MD  lidocaine (LIDODERM) 5 % Place 1 patch onto the skin daily as needed (For pain.).    Yes Historical Provider, MD  metoprolol (LOPRESSOR) 50 MG tablet Take 1 tablet (50 mg total) by mouth 2 (two) times daily. 01/02/13  Yes Leslye Peerobert S Byrum, MD  Multiple Vitamins-Minerals (MULTIVITAMIN WITH MINERALS) tablet Take 1 tablet  by mouth every morning.    Yes Historical Provider, MD  predniSONE (DELTASONE) 10 MG tablet 3 tabs daily for 4 days, then 2 tabs daily for 4 days, then 1 tab daily 11/28/13  Yes Jeanella CrazeBrandi L Ollis, NP  pregabalin (LYRICA) 200 MG capsule Take 200 mg by mouth 2 (two) times daily as needed (For pain.).    Yes Historical Provider, MD  promethazine (PHENERGAN) 25 MG tablet Take 1 tablet (25 mg total) by mouth every 6 (six) hours as needed for Nausea. 11/02/11  Yes Historical Provider, MD  sodium chloride (OCEAN) 0.65 % SOLN nasal spray Place 1 spray into both nostrils as needed for congestion. 11/28/13  Yes Jeanella CrazeBrandi L Ollis, NP  traMADol (ULTRAM) 50 MG tablet Take 1 tablet (50 mg total) by mouth every 6 (six) hours as needed. Patient taking differently: Take 50 mg by mouth every 6 (six) hours as needed for moderate pain.  09/05/13  Yes Santiago GladHeather Laisure, PA-C   Allergies  Allergen Reactions  . Clindamycin/Lincomycin Anaphylaxis and Swelling    Made face and tongue swell    FAMILY HISTORY:  family history includes Diabetes in his father. SOCIAL HISTORY:  reports that he has never smoked. He has never used smokeless tobacco. He reports that he drinks alcohol. He reports that he does not use illicit drugs.  REVIEW OF SYSTEMS:   As per HPI - All other systems reviewed and were neg.    SUBJECTIVE:   VITAL SIGNS: Temp:  [98.1 F (36.7 C)] 98.1 F (36.7 C) (12/30 1148) Pulse Rate:  [125-138] 125 (12/30 1630) Resp:  [18-35] 35 (12/30 1630) BP: (145-176)/(94-117) 145/111 mmHg (12/30 1630) SpO2:  [94 %-100 %] 98 % (12/30 1630) Weight:  [178 lb (80.74 kg)] 178 lb (80.74 kg) (12/30 1249)  PHYSICAL EXAMINATION: General:  Pleasant, wdwn male, NAD  Neuro:  Awake, alert, MAE, appropriate HEENT:  Mm dry, no JVD  Cardiovascular:  s1s2 rrr, mild tachy Lungs:  resps even, mildly labored at times but improved, coarse, exp wheeze  Abdomen:  Soft, +bs  Musculoskeletal:  Warm and dry, no sig edema   Recent  Labs Lab 01/09/14 1210  NA 137  K 3.8  CL 99  CO2 30  BUN 15  CREATININE 0.84  GLUCOSE 87    Recent Labs Lab 01/09/14 1210  HGB 12.1*  HCT 39.4  WBC 10.4  PLT 285   Dg Chest Port 1 View  01/09/2014   CLINICAL DATA:  Shortness of breath.  History sarcoidosis.  EXAM: PORTABLE CHEST - 1 VIEW  COMPARISON:  11/26/2013, 08/22/2013, 08/31/2012.  FINDINGS: Mediastinum and hilar structures are unremarkable. Bilateral pleural parenchymal thickening again noted. No change. Findings consistent with scarring. No acute infiltrate. Heart size stable. No pleural effusion or pneumothorax. No acute bony abnormality.  IMPRESSION: Stable severe bilateral pleural parenchymal scarring. These changes may be related to patient's known sarcoidosis.  Electronically Signed   By: Maisie Fus  Register   On: 01/09/2014 12:47    ASSESSMENT / PLAN:  Acute on chronic respiratory failure - r/t Sarcoidosis with acute flare  OSA - on CPAP  COPD   Plan:  Admit to SDU  IV steroids  BD's  IV abx -- vanc, zosyn with recent hospital admission, recent abx as outpt  2D echo  Check BNP  Check RVP and sputum culture  Pan culture  CPAP qhs  PRN bipap  mucinex  Cont home lasix    Hx HTN  P:  Cont home metoprolol, cardizem  F/u BNP  f/u Jackquline Berlin, NP 01/09/2014  5:05 PM Pager: [phone] or [phone]  Admit to the SDU, start high dose steroids, start broad spectrum abx (recently on abx and steroid taper from early this month), CPAP for OSA (on home CPAP), pan culture and follow.  No BiPAP but for PRN.  No wheezing.  Bronchodilators.  Will follow.  Patient seen and examined, agree with above note.  I dictated the care and orders written for this patient under my direction.  Alyson Reedy, MD [phone]

## 2014-01-09 NOTE — ED Notes (Signed)
Intensivist at bedside.

## 2014-01-09 NOTE — ED Notes (Signed)
MD notified-patient's SBP and DBP continually increasing. Per patient-has not had BP medications today.

## 2014-01-09 NOTE — Progress Notes (Addendum)
Per MD pt can have a break form BIPAP. Rt placed on 3LPM Barnes.

## 2014-01-09 NOTE — ED Notes (Signed)
MD Criss AlvineGoldston at bedside. Intensivist and hospitalist also at bedside.

## 2014-01-09 NOTE — Progress Notes (Signed)
Rx Brief Lovenox note:  DVT prophylaxis  48 yoM admitted with PNA.  Wt=82 kg CrCl~94 ml/min  Plan: Lovenox 40mg  SQ daily for DVT prophylaxis.  Tx  Lorenza EvangelistGreen, Hailey Miles R 01/09/2014 11:18 PM

## 2014-01-09 NOTE — Progress Notes (Signed)
ANTIBIOTIC CONSULT NOTE - INITIAL  Pharmacy Consult for Vancomycin, Zosyn Indication: Pneumonia, ? Sepsis  Allergies  Allergen Reactions  . Clindamycin/Lincomycin Anaphylaxis and Swelling    Made face and tongue swell    Patient Measurements: Height: 5' 7.5" (171.5 cm) Weight: 178 lb (80.74 kg) IBW/kg (Calculated) : 67.25  Vital Signs: Temp: 98.1 F (36.7 C) (12/30 1148) Temp Source: Oral (12/30 1148) BP: 157/100 mmHg (12/30 1730) Pulse Rate: 74 (12/30 1700) Intake/Output from previous day:   Intake/Output from this shift:    Labs:  Recent Labs  01/09/14 1210  WBC 10.4  HGB 12.1*  PLT 285  CREATININE 0.84   Estimated Creatinine Clearance: 101.3 mL/min (by C-G formula based on Cr of 0.84). No results for input(s): VANCOTROUGH, VANCOPEAK, VANCORANDOM, GENTTROUGH, GENTPEAK, GENTRANDOM, TOBRATROUGH, TOBRAPEAK, TOBRARND, AMIKACINPEAK, AMIKACINTROU, AMIKACIN in the last 72 hours.   Microbiology: No results found for this or any previous visit (from the past 720 hour(s)).  Medical History: Past Medical History  Diagnosis Date  . Hypertension   . Sarcoidosis   . OSA on CPAP   . Chronic respiratory failure     home oxygen PRN  . Bronchitis   . Pneumonia   . COPD (chronic obstructive pulmonary disease)   . Neuromuscular disorder     Assessment: 48 y/o M with PMH of sarcoidosis, OSA on CPAP, HTN, chronic respiratory failure, COPD who presents with worsening SOB and cough.  Patient was given antibiotics and prednisone taper as outpatient without improvement.  Pharmacy consulted to assist with dosing of Vancomycin and Zosyn for possible pneumonia, acute on chronic respiratory failure, ? sepsis.  12/30 >> Vancomycin >> 12/30 >> Zosyn >>    Tmax: 98.1 F WBCs: WNL Renal: SCr 0.84, CrCl > 100 mL/min CG  12/30 blood x 2: sent 12/30 urine: ordered  12/30 sputum: ordered 12/30 respiratory virus panel: sent   Goal of Therapy:  Vancomycin trough level 15-20 mcg/ml   Appropriate antibiotic dosing for renal function and indication Eradication of infection  Plan:   Vancomycin 1 gram IV q8h.  Plan for Vancomycin trough level at steady state.  Zosyn 3.375 g IV x 1 over 30 min, then q8h with each dose infused over 4 hours.  Follow-up renal function, cultures, clinical course.   Greer PickerelJigna Aribella Vavra, PharmD, BCPS Pager: [phone] 01/09/2014 5:48 PM

## 2014-01-09 NOTE — ED Provider Notes (Signed)
CSN: [id number]     Arrival date & time 01/09/14  1143 History   First MD Initiated Contact with Patient 01/09/14 1144     Chief Complaint  Patient presents with  . Shortness of Breath  . Sarcoidosis     (Consider location/radiation/quality/duration/timing/severity/associated sxs/prior Treatment) HPI  48 year old male presents with wheezing and shortness of breath. 3 days ago he started having cough and congestion and then over last 24 hours as had worsening shortness of breath and wheezing. Has been taking home a beer all treatment is with no relief. Girlfriend is sick with similar symptoms but not as severe. He has had to be intubated for symptoms like this but this is not as severe. Is having some abdominal pain, no chest pain. Has been feeling hot and having sweats. Tried taking some prednisone he had at home to help relieve his bronchospasm. States this feels like his prior sarcoidosis. He denies a prior history of COPD, however his chart shows a history of COPD.  Past Medical History  Diagnosis Date  . Hypertension   . Sarcoidosis   . OSA on CPAP   . Chronic respiratory failure     home oxygen PRN  . Bronchitis   . Pneumonia   . COPD (chronic obstructive pulmonary disease)   . Neuromuscular disorder    Past Surgical History  Procedure Laterality Date  . Rotator cuff repair    . Arm surgery     . Video assisted thoracoscopy (vats)/thorocotomy Left 07/17/2012    Procedure: VIDEO ASSISTED THORACOSCOPY (VATS)/BLEB Stapling;  Surgeon: Alleen Borne, MD;  Location: [facility] OR;  Service: Thoracic;  Laterality: Left;   Family History  Problem Relation Age of Onset  . Diabetes Father    History  Substance Use Topics  . Smoking status: Never Smoker   . Smokeless tobacco: Never Used  . Alcohol Use: Yes     Comment: social    Review of Systems  Constitutional: Positive for fever and chills.  Respiratory: Positive for cough, shortness of breath and wheezing.   Cardiovascular:  Negative for chest pain.  Gastrointestinal: Positive for abdominal pain.  All other systems reviewed and are negative.     Allergies  Clindamycin/lincomycin  Home Medications   Prior to Admission medications   Medication Sig Start Date End Date Taking? Authorizing Provider  albuterol (PROAIR HFA) 108 (90 BASE) MCG/ACT inhaler Inhale 2 puffs into the lungs every 6 (six) hours as needed for wheezing or shortness of breath. 03/28/13   Leslye Peer, MD  albuterol (PROVENTIL) (2.5 MG/3ML) 0.083% nebulizer solution Take 2.5 mg by nebulization every 4 (four) hours as needed for wheezing or shortness of breath.  06/08/12   Historical Provider, MD  budesonide-formoterol (SYMBICORT) 160-4.5 MCG/ACT inhaler Inhale 2 puffs into the lungs 2 (two) times daily. 11/28/13   Jeanella Craze, NP  calcium-vitamin D (OSCAL WITH D) 500-200 MG-UNIT per tablet Take 1 tablet by mouth every morning.    Historical Provider, MD  cholecalciferol (VITAMIN D) 1000 UNITS tablet Take 1,000 Units by mouth every morning.     Historical Provider, MD  cyclobenzaprine (FLEXERIL) 10 MG tablet Take 10 mg by mouth 3 (three) times daily as needed for muscle spasms.     Historical Provider, MD  diltiazem (CARDIZEM) 30 MG tablet Take 1 tablet (30 mg total) by mouth every 8 (eight) hours. 11/28/13   Jeanella Craze, NP  diphenhydrAMINE (BENADRYL) 25 MG tablet Take 25 mg by mouth every 6 (six) hours  as needed for allergies.     Historical Provider, MD  famotidine (PEPCID) 20 MG tablet Take 1 tablet (20 mg total) by mouth 2 (two) times daily. 03/08/12   Renae FickleMackenzie Short, MD  ferrous sulfate 325 (65 FE) MG tablet Take 1 tablet (325 mg total) by mouth 2 (two) times daily with a meal. 06/18/12   Leroy SeaPrashant K Singh, MD  furosemide (LASIX) 20 MG tablet Take 1 tablet (20 mg total) by mouth daily as needed for fluid or edema (Take if > 3lb weight gain in 24 hours or 5 lbs in 1 week,  or increased swelling in lower extremities). 11/28/13   Jeanella CrazeBrandi L  Ollis, NP  guaiFENesin (MUCINEX) 600 MG 12 hr tablet Take 1,200 mg by mouth 2 (two) times daily as needed (For congestion.).     Historical Provider, MD  HYDROcodone-homatropine (HYCODAN) 5-1.5 MG/5ML syrup Take 5 mLs by mouth every 6 (six) hours as needed for cough. 12/11/13   Leslye Peerobert S Byrum, MD  lidocaine (LIDODERM) 5 % Place 1 patch onto the skin daily as needed (For pain.).     Historical Provider, MD  metoprolol (LOPRESSOR) 50 MG tablet Take 1 tablet (50 mg total) by mouth 2 (two) times daily. 01/02/13   Leslye Peerobert S Byrum, MD  Multiple Vitamins-Minerals (MULTIVITAMIN WITH MINERALS) tablet Take 1 tablet by mouth every morning.     Historical Provider, MD  predniSONE (DELTASONE) 10 MG tablet 3 tabs daily for 4 days, then 2 tabs daily for 4 days, then 1 tab daily 11/28/13   Jeanella CrazeBrandi L Ollis, NP  pregabalin (LYRICA) 200 MG capsule Take 200 mg by mouth 2 (two) times daily as needed (For pain.).     Historical Provider, MD  promethazine (PHENERGAN) 25 MG tablet Take 1 tablet (25 mg total) by mouth every 6 (six) hours as needed for Nausea. 11/02/11   Historical Provider, MD  sodium chloride (OCEAN) 0.65 % SOLN nasal spray Place 1 spray into both nostrils as needed for congestion. 11/28/13   Jeanella CrazeBrandi L Ollis, NP  traMADol (ULTRAM) 50 MG tablet Take 1 tablet (50 mg total) by mouth every 6 (six) hours as needed. 09/05/13   Heather Laisure, PA-C   BP 160/117 mmHg  Pulse 130  Temp(Src) 98.1 F (36.7 C) (Oral)  Resp 30  SpO2 97% Physical Exam  Constitutional: He is oriented to person, place, and time. He appears well-developed and well-nourished. He appears distressed.  HENT:  Head: Normocephalic and atraumatic.  Right Ear: External ear normal.  Left Ear: External ear normal.  Nose: Nose normal.  Eyes: Right eye exhibits no discharge. Left eye exhibits no discharge.  Neck: Neck supple.  Cardiovascular: Normal rate, regular rhythm, normal heart sounds and intact distal pulses.   Pulmonary/Chest: Accessory  muscle usage present. Tachypnea noted. He has decreased breath sounds. He has wheezes.  Abdominal: Soft. He exhibits distension. There is no tenderness.  Musculoskeletal: He exhibits no edema.  Neurological: He is alert and oriented to person, place, and time.  Skin: Skin is warm. He is diaphoretic.  Nursing note and vitals reviewed.   ED Course  Procedures (including critical care time) Labs Review Labs Reviewed  CBC WITH DIFFERENTIAL - Abnormal; Notable for the following:    Hemoglobin 12.1 (*)    Neutrophils Relative % 86 (*)    Neutro Abs 8.8 (*)    Lymphocytes Relative 6 (*)    All other components within normal limits  BLOOD GAS, ARTERIAL - Abnormal; Notable for the following:  pCO2 arterial 49.8 (*)    pO2, Arterial 129.0 (*)    Bicarbonate 28.1 (*)    Acid-Base Excess 2.6 (*)    All other components within normal limits  COMPREHENSIVE METABOLIC PANEL  I-STAT CG4 LACTIC ACID, ED    Imaging Review Dg Chest Port 1 View  01/09/2014   CLINICAL DATA:  Shortness of breath.  History sarcoidosis.  EXAM: PORTABLE CHEST - 1 VIEW  COMPARISON:  11/26/2013, 08/22/2013, 08/31/2012.  FINDINGS: Mediastinum and hilar structures are unremarkable. Bilateral pleural parenchymal thickening again noted. No change. Findings consistent with scarring. No acute infiltrate. Heart size stable. No pleural effusion or pneumothorax. No acute bony abnormality.  IMPRESSION: Stable severe bilateral pleural parenchymal scarring. These changes may be related to patient's known sarcoidosis.   Electronically Signed   By: Maisie Fushomas  Register   On: 01/09/2014 12:47     EKG Interpretation   Date/Time:  Wednesday January 09 2014 11:45:54 EST Ventricular Rate:  127 PR Interval:  134 QRS Duration: 84 QT Interval:  301 QTC Calculation: 437 R Axis:   94 Text Interpretation:  Sinus tachycardia Borderline right axis deviation  Nonspecific T abnormalities, inferior leads Baseline wander in lead(s) II  III aVF No  significant change since Nov 2015 Confirmed by Criss AlvineGOLDSTON  MD,  Isa Hitz ([id number]) on 01/09/2014 11:50:43 AM      CRITICAL CARE Performed by: Pricilla LovelessGOLDSTON, Teanna Elem T   Total critical care time: 60 minutes  Critical care time was exclusive of separately billable procedures and treating other patients.  Critical care was necessary to treat or prevent imminent or life-threatening deterioration.  Critical care was time spent personally by me on the following activities: development of treatment plan with patient and/or surrogate as well as nursing, discussions with consultants, evaluation of patient's response to treatment, examination of patient, obtaining history from patient or surrogate, ordering and performing treatments and interventions, ordering and review of laboratory studies, ordering and review of radiographic studies, pulse oximetry and re-evaluation of patient's condition.  MDM   Final diagnoses:  COPD exacerbation  Acute on chronic respiratory failure, unspecified whether with hypoxia or hypercapnia    Patient with acute shortness of breath consistent with prior COPD exacerbations. Patient is quite tachypnea and has accessory muscle use on arrival. Patient is not hypercarbic but has felt somewhat better with BiPAP. Is on his second hour of albuterol treatments though still has significant wheezing and shortness of breath. I feel he is improving and does not need intubation at this time. Discussed with the hospitalist, they are recommending pulmonary admission given he is a familiar patient to them and is complicated. Discussed with critical care will come evaluate the patient in the ER.    Audree CamelScott T Madge Therrien, MD 01/09/14 [phone]

## 2014-01-09 NOTE — Progress Notes (Signed)
UR completed 

## 2014-01-10 ENCOUNTER — Inpatient Hospital Stay (HOSPITAL_COMMUNITY): Payer: 59

## 2014-01-10 DIAGNOSIS — J441 Chronic obstructive pulmonary disease with (acute) exacerbation: Secondary | ICD-10-CM

## 2014-01-10 LAB — RESPIRATORY VIRUS PANEL
Adenovirus: NOT DETECTED
Influenza A H1: NOT DETECTED
Influenza A H3: NOT DETECTED
Influenza A: NOT DETECTED
Influenza B: NOT DETECTED
Metapneumovirus: NOT DETECTED
PARAINFLUENZA 2 A: NOT DETECTED
Parainfluenza 1: NOT DETECTED
Parainfluenza 3: NOT DETECTED
RESPIRATORY SYNCYTIAL VIRUS A: DETECTED — AB
RESPIRATORY SYNCYTIAL VIRUS B: NOT DETECTED
RHINOVIRUS: NOT DETECTED

## 2014-01-10 LAB — BASIC METABOLIC PANEL
ANION GAP: 9 (ref 5–15)
BUN: 17 mg/dL (ref 6–23)
CHLORIDE: 100 meq/L (ref 96–112)
CO2: 29 mmol/L (ref 19–32)
Calcium: 9.5 mg/dL (ref 8.4–10.5)
Creatinine, Ser: 0.94 mg/dL (ref 0.50–1.35)
GFR calc non Af Amer: >90 mL/min (ref >90)
Glucose, Bld: 148 mg/dL — ABNORMAL HIGH (ref 70–99)
Potassium: 4.6 mmol/L (ref 3.5–5.1)
Sodium: 138 mmol/L (ref 135–145)

## 2014-01-10 LAB — CBC
HCT: 37.8 % — ABNORMAL LOW (ref 39.0–52.0)
HEMOGLOBIN: 11.8 g/dL — AB (ref 13.0–17.0)
MCH: 26.6 pg (ref 26.0–34.0)
MCHC: 31.2 g/dL (ref 30.0–36.0)
MCV: 85.3 fL (ref 78.0–100.0)
Platelets: 272 10*3/uL (ref 150–400)
RBC: 4.43 MIL/uL (ref 4.22–5.81)
RDW: 13.8 % (ref 11.5–15.5)
WBC: 12.8 10*3/uL — AB (ref 4.0–10.5)

## 2014-01-10 LAB — BRAIN NATRIURETIC PEPTIDE: B Natriuretic Peptide: 52.1 pg/mL (ref 0.0–100.0)

## 2014-01-10 LAB — PHOSPHORUS: Phosphorus: 3.7 mg/dL (ref 2.3–4.6)

## 2014-01-10 LAB — MAGNESIUM: MAGNESIUM: 2 mg/dL (ref 1.5–2.5)

## 2014-01-10 LAB — CORTISOL: CORTISOL PLASMA: 11.5 ug/dL

## 2014-01-10 MED ORDER — BUDESONIDE 0.25 MG/2ML IN SUSP
0.2500 mg | Freq: Four times a day (QID) | RESPIRATORY_TRACT | Status: DC
  Administered 2014-01-10 – 2014-01-14 (×15): 0.25 mg via RESPIRATORY_TRACT
  Filled 2014-01-10 (×16): qty 2

## 2014-01-10 MED ORDER — SALINE SPRAY 0.65 % NA SOLN
1.0000 | NASAL | Status: DC | PRN
  Administered 2014-01-10: 1 via NASAL
  Filled 2014-01-10: qty 44

## 2014-01-10 MED ORDER — HYDROCODONE-HOMATROPINE 5-1.5 MG/5ML PO SYRP
5.0000 mL | ORAL_SOLUTION | Freq: Four times a day (QID) | ORAL | Status: DC | PRN
  Administered 2014-01-11 – 2014-01-15 (×10): 5 mL via ORAL
  Filled 2014-01-10 (×10): qty 5

## 2014-01-10 MED ORDER — CALCIUM CARBONATE ANTACID 500 MG PO CHEW
1.0000 | CHEWABLE_TABLET | Freq: Three times a day (TID) | ORAL | Status: DC | PRN
  Administered 2014-01-10 – 2014-01-14 (×2): 200 mg via ORAL
  Filled 2014-01-10 (×2): qty 1

## 2014-01-10 MED ORDER — ALUM & MAG HYDROXIDE-SIMETH 200-200-20 MG/5ML PO SUSP
30.0000 mL | Freq: Four times a day (QID) | ORAL | Status: DC | PRN
  Administered 2014-01-10 – 2014-01-15 (×4): 30 mL via ORAL
  Filled 2014-01-10 (×4): qty 30

## 2014-01-10 NOTE — Progress Notes (Signed)
Name: [name] MRN: [id number] DOB: 03/13/1965    ADMISSION DATE:  01/09/2014   REFERRING MD :  [name]   CHIEF COMPLAINT:  Respiratory failure   BRIEF PATIENT DESCRIPTION:  48yo male with hx sarcoidosis (dx by tbbx in 2002), OSA on CPAP +/- ?asthma.  Presented 12/30 with c/o SOB since 12/25 as well as cough, non productive.  No fevers, chills.  Just seen as outpt (RB) and given abx and pred taper without improvement.  Worsening of SOB over last few days.  Was extremely dyspneic on arrival to ER and placed on bipap, improved quickly, no further bipap needed.  PCCM called to admit.   SIGNIFICANT EVENTS    STUDIES:  2D echo 12/30>>>  SUBJECTIVE: CPAP overnight well tolerated, FiO2 35% with good sats, no events, no new complaints.  VITAL SIGNS: Temp:  [97.5 F (36.4 C)-98.4 F (36.9 C)] 98.2 F (36.8 C) (12/31 0400) Pulse Rate:  [37-138] 88 (12/31 0640) Resp:  [17-35] 20 (12/31 0640) BP: (135-176)/(90-117) 141/100 mmHg (12/31 0640) SpO2:  [81 %-100 %] 99 % (12/31 0640) FiO2 (%):  [35 %] 35 % (12/30 2321) Weight:  [80.74 kg (178 lb)-82.9 kg (182 lb 12.2 oz)] 82.9 kg (182 lb 12.2 oz) (12/31 0425)  PHYSICAL EXAMINATION: General:  Pleasant, wdwn male, NAD  Neuro: Asleep but easily awakens, MAE, appropriate HEENT:  Mm dry, no JVD  Cardiovascular:  s1s2 rrr, mild tachy Lungs:  resps even, exp wheeze on CPAP. Abdomen:  Soft, +bs  Musculoskeletal:  Warm and dry, no sig edema  Recent Labs Lab 01/09/14 1210 01/09/14 1745 01/10/14 0410  NA 137 137 138  K 3.8 4.0 4.6  CL 99 100 100  CO2 30 25 29   BUN 15 16 17   CREATININE 0.84 0.97 0.94  GLUCOSE 87 170* 148*    Recent Labs Lab 01/09/14 1210 01/09/14 1745 01/10/14 0410  HGB 12.1* 11.5* 11.8*  HCT 39.4 36.5* 37.8*  WBC 10.4 11.7* 12.8*  PLT 285 252 272   Dg Chest Port 1 View  01/10/2014   CLINICAL DATA:  Respiratory failure followup evaluation  EXAM: PORTABLE CHEST - 1 VIEW  COMPARISON:  01/09/2014 and prior  studies  FINDINGS: The heart size and vascular pattern are normal. There is hyperinflation. There is opacity throughout the left mid to lower lung zone. There is also opacity in the right mid lung zone. There is rounded 2.5 cm opacity laterally in the right middle lobe. There is also a left hilar prominence. These findings are unchanged from 11/25/2013.  IMPRESSION: Stable chronic bilateral opacities likely related to patient's known end-stage sarcoidosis.   Electronically Signed   By: Esperanza Heiraymond  Rubner M.D.   On: 01/10/2014 07:30   Dg Chest Port 1 View  01/09/2014   CLINICAL DATA:  Shortness of breath.  History sarcoidosis.  EXAM: PORTABLE CHEST - 1 VIEW  COMPARISON:  11/26/2013, 08/22/2013, 08/31/2012.  FINDINGS: Mediastinum and hilar structures are unremarkable. Bilateral pleural parenchymal thickening again noted. No change. Findings consistent with scarring. No acute infiltrate. Heart size stable. No pleural effusion or pneumothorax. No acute bony abnormality.  IMPRESSION: Stable severe bilateral pleural parenchymal scarring. These changes may be related to patient's known sarcoidosis.   Electronically Signed   By: Maisie Fushomas  Register   On: 01/09/2014 12:47   ASSESSMENT / PLAN:  Acute on chronic respiratory failure - r/t Sarcoidosis with acute flare  OSA - on CPAP  COPD   Plan:  Hold in SDU today. Continu IV steroids for  now, once more improved will change to PO with a slow taper (seems that with tapering his symptoms returned quickly). BD's as ordered. Continue IV abx -- vanc, zosyn with recent hospital admission, recent abx as outpt  2D echo pending.  Check BNP (pending) Check RVP and sputum culture (pending, on droplet isolation) Pan culture NTD CPAP qhs at home pressure of 10 cmH2O. D/C BiPAP  Continue Mucinex  Cont home lasix   Hx HTN  P:  Cont home metoprolol, cardizem  F/u BNP  F/u chem   Alyson ReedyWesam G. Yacoub, M.D. [facility]. Pager: [phone]. After  hours pager: [phone].

## 2014-01-10 NOTE — Progress Notes (Signed)
  Echocardiogram 2D Echocardiogram has been performed.  Dorethia Jeanmarie 01/10/2014, 2:38 PM

## 2014-01-10 NOTE — Progress Notes (Signed)
RT placed pt on CPAP per pt request at 10cmH2O per home settings via home ffm. Pt states he is comfortable and is tolerating CPAP well at this time. RT will continue to monitor as needed.

## 2014-01-11 DIAGNOSIS — J9601 Acute respiratory failure with hypoxia: Secondary | ICD-10-CM

## 2014-01-11 LAB — BASIC METABOLIC PANEL
Anion gap: 9 (ref 5–15)
BUN: 21 mg/dL (ref 6–23)
CO2: 27 mmol/L (ref 19–32)
CREATININE: 0.92 mg/dL (ref 0.50–1.35)
Calcium: 8.9 mg/dL (ref 8.4–10.5)
Chloride: 101 mEq/L (ref 96–112)
GFR calc non Af Amer: >90 mL/min (ref >90)
GLUCOSE: 165 mg/dL — AB (ref 70–99)
Potassium: 4.5 mmol/L (ref 3.5–5.1)
SODIUM: 137 mmol/L (ref 135–145)

## 2014-01-11 LAB — CBC
HCT: 36.1 % — ABNORMAL LOW (ref 39.0–52.0)
Hemoglobin: 11.5 g/dL — ABNORMAL LOW (ref 13.0–17.0)
MCH: 27 pg (ref 26.0–34.0)
MCHC: 31.9 g/dL (ref 30.0–36.0)
MCV: 84.7 fL (ref 78.0–100.0)
PLATELETS: 264 10*3/uL (ref 150–400)
RBC: 4.26 MIL/uL (ref 4.22–5.81)
RDW: 13.8 % (ref 11.5–15.5)
WBC: 14 10*3/uL — ABNORMAL HIGH (ref 4.0–10.5)

## 2014-01-11 LAB — EXPECTORATED SPUTUM ASSESSMENT W GRAM STAIN, RFLX TO RESP C

## 2014-01-11 LAB — PHOSPHORUS: Phosphorus: 2.9 mg/dL (ref 2.3–4.6)

## 2014-01-11 LAB — EXPECTORATED SPUTUM ASSESSMENT W REFEX TO RESP CULTURE

## 2014-01-11 LAB — MAGNESIUM: MAGNESIUM: 2.2 mg/dL (ref 1.5–2.5)

## 2014-01-11 MED ORDER — BISACODYL 5 MG PO TBEC: 5.0000 mg | DELAYED_RELEASE_TABLET | Freq: Every day | ORAL | Status: DC | PRN

## 2014-01-11 MED ORDER — FUROSEMIDE 10 MG/ML IJ SOLN
40.0000 mg | Freq: Four times a day (QID) | INTRAMUSCULAR | Status: AC
  Administered 2014-01-11 (×2): 40 mg via INTRAVENOUS
  Filled 2014-01-11 (×2): qty 4

## 2014-01-11 MED ORDER — SENNA 8.6 MG PO TABS: 1.0000 | ORAL_TABLET | Freq: Every day | ORAL | Status: DC | PRN

## 2014-01-11 MED ORDER — MENTHOL 3 MG MT LOZG
1.0000 | LOZENGE | OROMUCOSAL | Status: DC | PRN
  Administered 2014-01-11: 3 mg via ORAL
  Filled 2014-01-11: qty 9

## 2014-01-11 NOTE — Progress Notes (Signed)
ANTIBIOTIC CONSULT NOTE - Follow up  Pharmacy Consult for Vancomycin, Zosyn Indication: Pneumonia, ? Sepsis  Allergies  Allergen Reactions  . Clindamycin/Lincomycin Anaphylaxis and Swelling    Made face and tongue swell    Patient Measurements: Height: 5' 7" (170.2 cm) Weight: 182 lb 12.2 oz (82.9 kg) IBW/kg (Calculated) : 66.1  Vital Signs: Temp: 97.5 F (36.4 C) (01/01 0800) Temp Source: Axillary (01/01 0800) BP: 152/92 mmHg (01/01 1210) Pulse Rate: 99 (01/01 1210) Intake/Output from previous day: 12/31 0701 - 01/01 0700 In: 1680 [P.O.:360; I.V.:570; IV Piggyback:750] Out: 600 [Urine:600] Intake/Output from this shift:    Labs:  Recent Labs  01/09/14 1745 01/10/14 0410 01/11/14 0402  WBC 11.7* 12.8* 14.0*  HGB 11.5* 11.8* 11.5*  PLT 252 272 264  CREATININE 0.97 0.94 0.92   Estimated Creatinine Clearance: 100 mL/min (by C-G formula based on Cr of 0.92). No results for input(s): VANCOTROUGH, VANCOPEAK, VANCORANDOM, GENTTROUGH, GENTPEAK, GENTRANDOM, TOBRATROUGH, TOBRAPEAK, TOBRARND, AMIKACINPEAK, AMIKACINTROU, AMIKACIN in the last 72 hours.   Microbiology: Recent Results (from the past 720 hour(s))  Respiratory virus panel (routine influenza)     Status: Abnormal   Collection Time: 01/09/14  5:38 PM  Result Value Ref Range Status   Source - RVPAN NOSE  Final   Respiratory Syncytial Virus A DETECTED (A)  Final   Respiratory Syncytial Virus B NOT DETECTED  Final   Influenza A NOT DETECTED  Final   Influenza B NOT DETECTED  Final   Parainfluenza 1 NOT DETECTED  Final   Parainfluenza 2 NOT DETECTED  Final   Parainfluenza 3 NOT DETECTED  Final   Metapneumovirus NOT DETECTED  Final   Rhinovirus NOT DETECTED  Final   Adenovirus NOT DETECTED  Final   Influenza A H1 NOT DETECTED  Final   Influenza A H3 NOT DETECTED  Final    Comment: (NOTE)       Normal Reference Range for each Analyte: NOT DETECTED Testing performed using the Luminex xTAG Respiratory Viral Panel  test kit. The analytical performance characteristics of this assay have been determined by [employer].  The modifications have not been cleared or approved by the FDA. This assay has been validated pursuant to the CLIA regulations and is used for clinical purposes. Performed at [facility] PCR Screening     Status: None   Collection Time: 01/09/14  6:36 PM  Result Value Ref Range Status   MRSA by PCR NEGATIVE NEGATIVE Final    Comment:        The GeneXpert MRSA Assay (FDA approved for NASAL specimens only), is one component of a comprehensive MRSA colonization surveillance program. It is not intended to diagnose MRSA infection nor to guide or monitor treatment for MRSA infections.     Assessment: 49 y/o M with PMH of sarcoidosis, OSA on CPAP, HTN, chronic respiratory failure, COPD who presents with worsening SOB and cough. Patient was given antibiotics and prednisone taper as outpatient without improvement. Pharmacy consulted to assist with dosing of Vancomycin and Zosyn for possible pneumonia, acute on chronic respiratory failure, ? sepsis.  12/30 >> Vanc >> 12/30 >> Zosyn >>   Tmax: AF WBCs: elevated (Solumedrol) Renal: SCr 0.94, CrCl 98CG  12/30 blood x 2: sent 12/30 urine: ordered  12/30 sputum: ordered 12/30 respiratory virus panel: +RSV A (Not sure of the significance of this)  Goal of Therapy:  Vancomycin trough level 15-20 mcg/ml  Appropriate antibiotic dosing for renal function and indication Eradication  of infection  Plan:   Cont Vanc 1g IV q8h.  Check Vanc trough today at 17:30.  Cont Zosyn 3.375g IV q8h with each dose infused over 4 hours.  Follow-up renal function, cultures, clinical course.  Romeo Rabon, PharmD, pager [phone]. 01/11/2014,1:54 PM.

## 2014-01-11 NOTE — Progress Notes (Signed)
eLink Physician-Brief Progress Note Patient Name: [name] DOB: Jan 04, 1966 MRN: [id number]   Date of Service  01/11/2014  HPI/Events of Note  Throat lasenger   eICU Interventions       Intervention Category Minor Interventions: Routine modifications to care plan (e.g. PRN medications for pain, fever)  Nelda Bucks. 01/11/2014, 7:41 PM

## 2014-01-11 NOTE — Progress Notes (Signed)
   Name: [name] MRN: [id number] DOB: [birth year]    ADMISSION DATE:  01/09/2014   REFERRING MD :  [name]   CHIEF COMPLAINT:  Respiratory failure   BRIEF PATIENT DESCRIPTION:  49yo male with hx sarcoidosis (dx by tbbx in 2002), OSA on CPAP +/- ?asthma.  Presented 12/30 with c/o SOB since 12/25 as well as cough, non productive.  No fevers, chills.  Just seen as outpt (RB) and given abx and pred taper without improvement.  Worsening of SOB over last few days.  Was extremely dyspneic on arrival to ER and placed on bipap, improved quickly, no further bipap needed.  PCCM called to admit.   SIGNIFICANT EVENTS    STUDIES:  2D echo 12/31>>> LVEF 60-65%, grade 1 diastolic dysfunction, mild pulmonary regurgitation  SUBJECTIVE: Was resting comfortably off of CPAP for 24 hours until this afternoon, then became more dyspnic. Denies sputum production  VITAL SIGNS: Temp:  [97.5 F (36.4 C)-98.8 F (37.1 C)] 97.5 F (36.4 C) (01/01 0800) Pulse Rate:  [71-99] 99 (01/01 1210) Resp:  [17-26] 22 (01/01 1210) BP: (130-186)/(91-108) 152/92 mmHg (01/01 1210) SpO2:  [95 %-100 %] 98 % (01/01 1210) FiO2 (%):  [3 %-35 %] 30 % (01/01 0320) Weight:  [82.9 kg (182 lb 12.2 oz)] 82.9 kg (182 lb 12.2 oz) (01/01 0500)  PHYSICAL EXAMINATION: General:  Comfortable on CPAP mask HEENT: CPAP in place PULM: diminished but fine crackles and few wheezes noted CV: RRR, no mgr AB: distended, BS+, nontender Ext: trace ankle edema, warm Neuro: A&Ox4, maew    Recent Labs Lab 01/09/14 1745 01/10/14 0410 01/11/14 0402  NA 137 138 137  K 4.0 4.6 4.5  CL 100 100 101  CO2 BUN CREATININE 0.97 0.94 0.92  GLUCOSE 170* 148* 165*    Recent Labs Lab 01/09/14 1745 01/10/14 0410 01/11/14 0402  HGB 11.5* 11.8* 11.5*  HCT 36.5* 37.8* 36.1*  WBC 11.7* 12.8* 14.0*  PLT 252 272 264   Dg Chest Port 1 View  01/10/2014   CLINICAL DATA:  Respiratory failure followup evaluation  EXAM: PORTABLE  CHEST - 1 VIEW  COMPARISON:  01/09/2014 and prior studies  FINDINGS: The heart size and vascular pattern are normal. There is hyperinflation. There is opacity throughout the left mid to lower lung zone. There is also opacity in the right mid lung zone. There is rounded 2.5 cm opacity laterally in the right middle lobe. There is also a left hilar prominence. These findings are unchanged from 11/25/2013.  IMPRESSION: Stable chronic bilateral opacities likely related to patient's known end-stage sarcoidosis.   Electronically Signed   By: Esperanza Heir M.D.   On: 01/10/2014 07:30   ASSESSMENT / PLAN:  Acute on chronic respiratory failure - r/t Sarcoidosis with acute flare  OSA - on CPAP  COPD  Respiratory Syncytial Virus positive > no role for anti-viral therapy (only data in bone marrow transplant) Pulm edema 1/1? Plan:  Hold in SDU today again 1/1 Maintain solumedrol as ordered for now, wean 1/2? BD's as ordered. Stop Vanc / Zosyn today Lasix x2 CPAP qhs at home pressure of 10 cmH2O > try nasal mask Continue Mucinex   Hx HTN  P:  Cont home metoprolol, cardizem  Lasix x1 F/u chem   Abdominal bloating> due to aerophagia from CPAP P: Try nasal CPAP mask Diet as tolerated  Heber [place], MD [place] [facility] Pager: [phone] Cell: [phone] If no response, call [phone]

## 2014-01-12 ENCOUNTER — Inpatient Hospital Stay (HOSPITAL_COMMUNITY): Payer: 59

## 2014-01-12 LAB — EXPECTORATED SPUTUM ASSESSMENT W GRAM STAIN, RFLX TO RESP C

## 2014-01-12 LAB — EXPECTORATED SPUTUM ASSESSMENT W REFEX TO RESP CULTURE

## 2014-01-12 LAB — BASIC METABOLIC PANEL
Anion gap: 6 (ref 5–15)
BUN: 29 mg/dL — ABNORMAL HIGH (ref 6–23)
CALCIUM: 9.1 mg/dL (ref 8.4–10.5)
CHLORIDE: 96 meq/L (ref 96–112)
CO2: 34 mmol/L — AB (ref 19–32)
Creatinine, Ser: 0.93 mg/dL (ref 0.50–1.35)
GFR calc Af Amer: >90 mL/min (ref >90)
GLUCOSE: 170 mg/dL — AB (ref 70–99)
POTASSIUM: 3.8 mmol/L (ref 3.5–5.1)
SODIUM: 136 mmol/L (ref 135–145)

## 2014-01-12 MED ORDER — GUAIFENESIN ER 600 MG PO TB12: 1200.0000 mg | ORAL_TABLET | Freq: Two times a day (BID) | ORAL | Status: DC

## 2014-01-12 MED ORDER — CETYLPYRIDINIUM CHLORIDE 0.05 % MT LIQD
7.0000 mL | Freq: Two times a day (BID) | OROMUCOSAL | Status: DC
  Administered 2014-01-12: 7 mL via OROMUCOSAL

## 2014-01-12 MED ORDER — GUAIFENESIN ER 600 MG PO TB12
600.0000 mg | ORAL_TABLET | Freq: Two times a day (BID) | ORAL | Status: DC
  Administered 2014-01-12 – 2014-01-16 (×9): 600 mg via ORAL
  Filled 2014-01-12 (×10): qty 1

## 2014-01-12 MED ORDER — CHLORHEXIDINE GLUCONATE 0.12 % MT SOLN
15.0000 mL | Freq: Two times a day (BID) | OROMUCOSAL | Status: DC
  Administered 2014-01-12 – 2014-01-13 (×3): 15 mL via OROMUCOSAL
  Filled 2014-01-12 (×3): qty 15

## 2014-01-12 NOTE — Progress Notes (Signed)
PT demonstrated verbal and hands on understanding of Flutter device. 

## 2014-01-12 NOTE — Plan of Care (Signed)
Problem: ICU Phase Progression Outcomes Goal: Dyspnea controlled at rest Outcome: Progressing Wearing 3LNC throughout shift, No CPAP since approximately 0840am, tolerating well.  Ambulated in hallway, the entire unit and tolerated well.  Up in chair most of day. Goal: Hemodynamically stable Outcome: Progressing Both Metoprolol and Cardizem as ordered for BP management. Goal: Pain controlled with appropriate interventions Outcome: Completed/Met Date Met:  01/12/14 Pt reports R sided abdominal pain from strain of coughing in recent past. Morphine Sulfate 15m IV given with adequate effect in managing discomfort.

## 2014-01-12 NOTE — Progress Notes (Signed)
Name: [name] MRN: [id number] DOB: 1965-03-15    ADMISSION DATE:  01/09/2014   REFERRING MD :  [name]   CHIEF COMPLAINT:  Respiratory failure   BRIEF PATIENT DESCRIPTION:  49yo male with hx sarcoidosis (dx by tbbx in 2002), OSA on CPAP +/- ?asthma.  Presented 12/30 with c/o SOB since 12/25 as well as cough, non productive.  No fevers, chills.  Just seen as outpt (RB) and given abx and pred taper without improvement.  Worsening of SOB over last few days.  Was extremely dyspneic on arrival to ER and placed on bipap, improved quickly, no further bipap needed.  PCCM called to admit.   SIGNIFICANT EVENTS    STUDIES:  2D echo 12/31>>> LVEF 60-65%, grade 1 diastolic dysfunction, mild pulmonary regurgitation  SUBJECTIVE: Used CPAP for most of the day yesterday, notes wheezing, cough with some mucus production, feels thick mucus in chest which is clear to yellow when he produces it  VITAL SIGNS: Temp:  [97.3 F (36.3 C)-97.7 F (36.5 C)] 97.3 F (36.3 C) (01/02 0800) Pulse Rate:  [67-99] 69 (01/02 0600) Resp:  [18-24] 18 (01/02 0600) BP: (133-186)/(86-112) 142/98 mmHg (01/02 0600) SpO2:  [95 %-100 %] 99 % (01/02 0600) FiO2 (%):  [30 %] 30 % (01/02 0600)  PHYSICAL EXAMINATION: General:  Comfortable on CPAP mask HEENT: CPAP in place PULM: wheezing noted bilaterally, good air movement CV: RRR, no mgr AB: distended, BS+, nontender Ext: trace ankle edema, warm Neuro: A&Ox4, maew    Recent Labs Lab 01/10/14 0410 01/11/14 0402 01/12/14 0354  NA 138 137 136  K 4.6 4.5 3.8  CL 100 101 96  CO2 29 27 34*  BUN 17 21 29*  CREATININE 0.94 0.92 0.93  GLUCOSE 148* 165* 170*    Recent Labs Lab 01/09/14 1745 01/10/14 0410 01/11/14 0402  HGB 11.5* 11.8* 11.5*  HCT 36.5* 37.8* 36.1*  WBC 11.7* 12.8* 14.0*  PLT 252 272 264   Dg Chest Port 1 View  01/12/2014   CLINICAL DATA:  Respiratory failure.  Short of breath.  EXAM: PORTABLE CHEST - 1 VIEW  COMPARISON:  01/10/2014.   02/28/2013.  FINDINGS: Cardiopericardial silhouette unchanged. Unchanged pulmonary parenchymal distortion, compatible with sarcoidosis. Bilateral apical bullous disease. Blunting of both costophrenic angles, with the appearance of the RIGHT more prominent than previously. There are probably bilateral pleural effusions which are small, with superimposed chronic blunting of the costophrenic angles.  IMPRESSION: Chronic pulmonary parenchymal distortion scarring associated with sarcoidosis. Probable small bilateral pleural effusions.   Electronically Signed   By: Andreas Newport M.D.   On: 01/12/2014 08:17   ASSESSMENT / PLAN:  Acute on chronic respiratory failure - r/t Sarcoidosis with acute asthmatic like flare  OSA - on CPAP  COPD  Respiratory Syncytial Virus positive > no role for anti-viral therapy (only data in bone marrow transplant) Plan:  Hold in SDU today again 1/2 Trial off CPAP all day if possible  Add flutter valve and scheduled  Maintain solumedrol as ordered for now, wean 1/3? BD's as ordered. Maintain off of antibiotics, monitor for change in sputum quality, fever, etc CPAP qhs at home pressure of 10 cmH2O  Continue Mucinex scheduled  Hx HTN  P:  Cont home metoprolol, cardizem  F/u chem   Abdominal bloating> due to aerophagia from CPAP, improved with nasal mask P: Nasal CPAP mask Diet as tolerated  Maintain in SDU given near continuous CPAP use, hopefully improve today  Heber , MD [place] [facility] Pager:  [phone] Cell: [phone] If no response, call [phone]

## 2014-01-13 DIAGNOSIS — B974 Respiratory syncytial virus as the cause of diseases classified elsewhere: Principal | ICD-10-CM

## 2014-01-13 DIAGNOSIS — B338 Other specified viral diseases: Secondary | ICD-10-CM

## 2014-01-13 DIAGNOSIS — J9600 Acute respiratory failure, unspecified whether with hypoxia or hypercapnia: Secondary | ICD-10-CM

## 2014-01-13 LAB — URINE CULTURE
Colony Count: NO GROWTH
Culture: NO GROWTH

## 2014-01-13 MED ORDER — LABETALOL HCL 5 MG/ML IV SOLN: 10.0000 mg | INTRAVENOUS | Status: DC | PRN

## 2014-01-13 MED ORDER — CETYLPYRIDINIUM CHLORIDE 0.05 % MT LIQD: 7.0000 mL | Freq: Two times a day (BID) | OROMUCOSAL | Status: DC

## 2014-01-13 MED ORDER — METHYLPREDNISOLONE SODIUM SUCC 125 MG IJ SOLR
60.0000 mg | Freq: Two times a day (BID) | INTRAMUSCULAR | Status: DC
  Administered 2014-01-13 – 2014-01-14 (×2): 60 mg via INTRAVENOUS
  Filled 2014-01-13 (×4): qty 0.96

## 2014-01-13 MED ORDER — LABETALOL HCL 5 MG/ML IV SOLN
10.0000 mg | INTRAVENOUS | Status: DC | PRN
  Administered 2014-01-13: 10 mg via INTRAVENOUS
  Filled 2014-01-13 (×2): qty 4

## 2014-01-13 NOTE — Progress Notes (Signed)
Pt states he will have the RN contact me when he is ready to go on CPAP QHS.

## 2014-01-13 NOTE — Progress Notes (Signed)
Pt placed on CPAP QHS.  Pt using his home mask with our tubing and machine.  Pt has 3 Lpm bled into the mask tubing.  Machine plugged into red outlet and humidifier filled with sterile water.  Pt stable and comfortable.

## 2014-01-13 NOTE — Progress Notes (Signed)
Name: [name] MRN: [id number] DOB: [birth year]    ADMISSION DATE:  01/09/2014   REFERRING MD :  [name]   CHIEF COMPLAINT:  Respiratory failure   BRIEF PATIENT DESCRIPTION:  49yo male with hx sarcoidosis (dx by tbbx in 2002), OSA on CPAP +/- ?asthma.  Presented 12/30 with c/o SOB since 12/25 as well as cough, non productive.  No fevers, chills.  Just seen as outpt (RB) and given abx and pred taper without improvement.  Worsening of SOB over last few days.  Was extremely dyspneic on arrival to ER and placed on bipap, improved quickly, no further bipap needed.  PCCM called to admit.   SIGNIFICANT EVENTS  12/30 > 12/31 steroids, near continuous BIPAP use 1/1 > slight improvement, near continuous CPAP use 1/2 > walked around unit  STUDIES:  2D echo 12/31>>> LVEF 60-65%, grade 1 diastolic dysfunction, mild pulmonary regurgitation  SUBJECTIVE: feeling much better, walked yesterday, still complaining of wheeze and cough with chest congestion  VITAL SIGNS: Temp:  [97.2 F (36.2 C)-98 F (36.7 C)] 98 F (36.7 C) (01/03 0400) Pulse Rate:  [39-87] 82 (01/03 0800) Resp:  [15-28] 24 (01/03 0800) BP: (115-165)/(84-114) 137/85 mmHg (01/03 0800) SpO2:  [93 %-100 %] 99 % (01/03 0800) FiO2 (%):  [30 %] 30 % (01/03 0329) Weight:  [84.9 kg (187 lb 2.7 oz)] 84.9 kg (187 lb 2.7 oz) (01/03 0500)  PHYSICAL EXAMINATION: General:  Sitting up in bed, comfortable, no distress HEENT: NCAT, PULM: wheezing again bilaterally, good air movement CV: RRR, no mgr AB: distended, BS+, nontender Ext: trace ankle edema, warm Neuro: A&Ox4, maew    Recent Labs Lab 01/10/14 0410 01/11/14 0402 01/12/14 0354  NA 138 137 136  K 4.6 4.5 3.8  CL 100 101 96  CO2 29 27 34*  BUN 17 21 29*  CREATININE 0.94 0.92 0.93  GLUCOSE 148* 165* 170*    Recent Labs Lab 01/09/14 1745 01/10/14 0410 01/11/14 0402  HGB 11.5* 11.8* 11.5*  HCT 36.5* 37.8* 36.1*  WBC 11.7* 12.8* 14.0*  PLT 252 272 264   Dg Chest  Port 1 View  01/12/2014   CLINICAL DATA:  Respiratory failure.  Short of breath.  EXAM: PORTABLE CHEST - 1 VIEW  COMPARISON:  01/10/2014.  02/28/2013.  FINDINGS: Cardiopericardial silhouette unchanged. Unchanged pulmonary parenchymal distortion, compatible with sarcoidosis. Bilateral apical bullous disease. Blunting of both costophrenic angles, with the appearance of the RIGHT more prominent than previously. There are probably bilateral pleural effusions which are small, with superimposed chronic blunting of the costophrenic angles.  IMPRESSION: Chronic pulmonary parenchymal distortion scarring associated with sarcoidosis. Probable small bilateral pleural effusions.   Electronically Signed   By: Andreas Newport M.D.   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 Maintain off of antibiotics, monitor for change in sputum quality, fever, etc O2 as needed to maintain O2 saturation > 90%  Hx HTN  P:  Cont home metoprolol, cardizem  Consider adding additional BP med if no improvement with weaning steroids  Abdominal bloating> due to aerophagia from CPAP, improved with nasal mask,  improved Constipation P: Nasal CPAP mask Diet as tolerated Bowel regimen  Move to floor today, PCCM service  Brent Heber CarolinaD [facility] [place] [facility] Pager: [phone] Cell: [phone] If no response, call [phone]

## 2014-01-14 LAB — BASIC METABOLIC PANEL
Anion gap: 10 (ref 5–15)
BUN: 23 mg/dL (ref 6–23)
CALCIUM: 9.3 mg/dL (ref 8.4–10.5)
CO2: 31 mmol/L (ref 19–32)
Chloride: 98 mEq/L (ref 96–112)
Creatinine, Ser: 0.87 mg/dL (ref 0.50–1.35)
GFR calc non Af Amer: >90 mL/min (ref >90)
GLUCOSE: 147 mg/dL — AB (ref 70–99)
POTASSIUM: 4.5 mmol/L (ref 3.5–5.1)
SODIUM: 139 mmol/L (ref 135–145)

## 2014-01-14 MED ORDER — PREDNISONE 50 MG PO TABS
50.0000 mg | ORAL_TABLET | Freq: Every day | ORAL | Status: DC
  Administered 2014-01-15 – 2014-01-16 (×2): 50 mg via ORAL
  Filled 2014-01-14 (×3): qty 1

## 2014-01-14 MED ORDER — FUROSEMIDE 20 MG PO TABS
20.0000 mg | ORAL_TABLET | Freq: Every day | ORAL | Status: DC
  Administered 2014-01-14 – 2014-01-16 (×3): 20 mg via ORAL
  Filled 2014-01-14 (×4): qty 1

## 2014-01-14 MED ORDER — MOMETASONE FURO-FORMOTEROL FUM 200-5 MCG/ACT IN AERO
2.0000 | INHALATION_SPRAY | Freq: Two times a day (BID) | RESPIRATORY_TRACT | Status: DC
  Administered 2014-01-14 – 2014-01-16 (×5): 2 via RESPIRATORY_TRACT
  Filled 2014-01-14: qty 8.8

## 2014-01-14 NOTE — Progress Notes (Signed)
Per Infection Prevention RN, patient does not require droplet isolation.  Instead he requires Contact Isolation.  Staff can still wear masks per standard precautions if within 3 feet of patient and he is actively coughing.  Will alter isolation order to Contact.  Allayne Butcher [facility]  01/14/2014  11:20 AM

## 2014-01-14 NOTE — Progress Notes (Signed)
Patient concerned about swelling in his abdomen, requesting to get his home dose of PRN lasix due to 2lb weight gain overnight.  On call MD notified, no orders received per on call rounding MD to address concerns.

## 2014-01-14 NOTE — Care Management Note (Signed)
CARE MANAGEMENT NOTE 01/14/2014  Patient:  [name],[name]   Account Number:  [id number]  Date Initiated:  01/14/2014  Documentation initiated by:  Sandford Craze  Subjective/Objective Assessment:   49 yo admitted with COPD     Action/Plan:   From home with girlfriend   Anticipated DC Date:  01/16/2014   Anticipated DC Plan:  HOME/SELF CARE      DC Planning Services  CM consult      Choice offered to / List presented to:             Status of service:  In process, will continue to follow Medicare Important Message given?   (If response is "NO", the following Medicare IM given date fields will be blank) Date Medicare IM given:   Medicare IM given by:   Date Additional Medicare IM given:   Additional Medicare IM given by:    Discharge Disposition:    Per UR Regulation:  Reviewed for med. necessity/level of care/duration of stay  If discussed at Long Length of Stay Meetings, dates discussed:    Comments:  01/14/14 Sandford Craze RN,BSN,NCM [phone] Chart reviewed and CM following for DC needs.  PT eval has been ordered and this CM will await recommendations and assist with DC planning as needed.

## 2014-01-14 NOTE — Discharge Summary (Signed)
Physician Discharge Summary       Patient ID: [name] MRN: [id number] DOB/AGE: 1965/02/10 49 y.o.  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 Presented 12/30 with c/o SOB since 12/25 as well as cough, non productive. No fevers, chills. Just seen as outpt ([name]) and given abx and pred taper without improvement. Worsening of SOB over last few days. Was extremely dyspneic on arrival to ER and placed on bipap, improved quickly, no further bipap needed. PCCM called to admit. Treated in the usual fashion which included: supplemental oxygen, BIPAP, systemic steroids, empiric antibiotics and scheduled BDs. Respiratory viral panel was sent, as were sputum and blood cultures. The only findings that reported positive was RSV A from the respiratory Viral panel. He continued to make slow but steady progress during his stay. His bronchodilators and steroids were weaned, he was mobilized, and his constipation was addressed,  And then by 1/6 he was deemed ready for d/c to home with the plan of care as outlined below.    Discharge Plan by active problems   Acute on chronic respiratory hypoxemic failure - r/t Sarcoidosis with acute asthmatic like flare from RSV  Bronchiectasis related to sarcoid OSA - on CPAP   Plan:  Continue flutter valve and scheduled mucinex pred taper  Resume home BDs O2 as needed to maintain O2 saturation > 90% CPAP qHS 10cm H20  Hx HTN  Plan:  Cont home metoprolol, cardizem  Consider adding additional BP med if no improvement with weaning steroids Add back low dose lasix - consider daily scheduled dose instead of prn for wt change  Abdominal discomfort>  due to constipation  Plan: Avoid atrovent Diet as tolerated Bowel regimen w/ PRN miralax  May need to re-eval CCB at some point  Significant Hospital tests/ studies  ECHO 12/31:  LVEF 60-65%, grade 1 diastolic dysfunction, mild pulmonary regurgitation  Discharge Exam: BP 132/94 mmHg  Pulse 109  Temp(Src) 99.1 F (37.3 C) (Oral)  Resp 18  Ht  (1.702 m)  Wt 86.637 kg (191 lb)  BMI 29.91 kg/m2  SpO2 100%  4 liters via [facility]: comfortable, no distress HEENT: NCAT, PULM: good air movement, no wheeze  CV: RRR, no mgr AB: distended, BS+, non-tender, improved  Ext: only trace ankle edema, warm Neuro: A&Ox4, maew  Labs at discharge Lab Results  Component Value Date   CREATININE 0.93 01/15/2014   BUN 28* 01/15/2014   NA 136 01/15/2014   K 4.0 01/15/2014   CL 95* 01/15/2014   CO2 32 01/15/2014   Lab Results  Component Value Date   WBC 14.0* 01/11/2014   HGB 11.5* 01/11/2014   HCT 36.1* 01/11/2014   MCV 84.7 01/11/2014   PLT 264 01/11/2014   Lab Results  Component Value Date   ALT 19 01/09/2014   AST 42* 01/09/2014   ALKPHOS 87 01/09/2014   BILITOT 0.4 01/09/2014   Lab Results  Component Value Date   INR 1.03 01/09/2014   INR 0.94 07/17/2012    Current radiology studies Dg Abd Portable 1v  01/15/2014   CLINICAL DATA:  Right upper quadrant abdominal pain and abdominal bloating that began earlier today.  EXAM: PORTABLE ABDOMEN - 1 VIEW  COMPARISON:  CT pelvis 02/28/2012.  FINDINGS: Bowel gas pattern unremarkable without evidence of obstruction or significant ileus. Very large stool burden in the colon. Phleboliths low in the right side of the pelvis. No visible opaque urinary tract calculi. Regional skeleton intact.  IMPRESSION: No acute abdominal abnormality.  Very large stool burden.   Electronically Signed   By: Hulan Saas M.D.   On: 01/15/2014 11:11    Disposition:  01-Home or Self Care      Discharge Instructions    Diet - low sodium heart  healthy    Complete by:  As directed      Increase activity slowly    Complete by:  As directed             Medication List    TAKE these medications        albuterol (2.5 MG/3ML) 0.083% nebulizer solution  Commonly known as:  PROVENTIL  Take 2.5 mg by nebulization every 4 (four) hours as needed for wheezing or shortness of breath.     albuterol 108 (90 BASE) MCG/ACT inhaler  Commonly known as:  PROAIR HFA  Inhale 2 puffs into the lungs every 6 (six) hours as needed for wheezing or shortness of breath.     bisacodyl 5 MG EC tablet  Commonly known as:  bisacodyl  Take 1 tablet (5 mg total) by mouth daily as needed for moderate constipation.     budesonide-formoterol 160-4.5 MCG/ACT inhaler  Commonly known as:  SYMBICORT  Inhale 2 puffs into the lungs 2 (two) times daily.     calcium-vitamin D 500-200 MG-UNIT per tablet  Commonly known as:  OSCAL WITH D  Take 1 tablet by mouth every morning.     cholecalciferol 1000 UNITS tablet  Commonly known as:  VITAMIN D  Take 1,000 Units by mouth every morning.     cyclobenzaprine 10 MG tablet  Commonly known as:  FLEXERIL  Take 10 mg by mouth 3 (three) times daily as needed for muscle spasms.     diltiazem 30 MG tablet  Commonly known as:  CARDIZEM  Take 1 tablet (30 mg total) by mouth every 8 (eight) hours.     diphenhydrAMINE 25 MG tablet  Commonly known as:  BENADRYL  Take 25 mg by mouth every 6 (six) hours as needed for allergies.     famotidine 20 MG tablet  Commonly known as:  PEPCID  Take 1 tablet (20 mg total) by mouth 2 (two) times daily.     ferrous sulfate 325 (65 FE) MG tablet  Take 1 tablet (325 mg total) by mouth 2 (two) times daily with a meal.     furosemide 20 MG tablet  Commonly known as:  LASIX  Take 1 tablet (20 mg total) by mouth daily as needed for fluid or edema (Take if > 3lb weight gain in 24 hours or 5 lbs in 1 week,  or increased swelling in lower extremities).     guaiFENesin 600 MG 12 hr  tablet  Commonly known as:  MUCINEX  Take 1,200 mg by mouth 2 (two) times daily as needed (For congestion.).     HYDROcodone-homatropine 5-1.5 MG/5ML syrup  Commonly known as:  HYCODAN  Take 5 mLs by mouth every 6 (six) hours as needed for cough.     lidocaine 5 %  Commonly known as:  LIDODERM  Place 1 patch onto the skin daily as needed (For pain.).     metoprolol 50 MG tablet  Commonly known  as:  LOPRESSOR  Take 1 tablet (50 mg total) by mouth 2 (two) times daily.     multivitamin with minerals tablet  Take 1 tablet by mouth every morning.     polyethylene glycol packet  Commonly known as:  MIRALAX / GLYCOLAX  Take 17 g by mouth daily.     predniSONE 10 MG tablet  Commonly known as:  DELTASONE  Take 4 tabs  daily with food x 4 days, then 3 tabs daily x 4 days, then 2 tabs daily x 4 days, then 1 tab daily x4 days then stop.     pregabalin 200 MG capsule  Commonly known as:  LYRICA  Take 200 mg by mouth 2 (two) times daily as needed (For pain.).     promethazine 25 MG tablet  Commonly known as:  PHENERGAN  Take 1 tablet (25 mg total) by mouth every 6 (six) hours as needed for Nausea.     sodium chloride 0.65 % Soln nasal spray  Commonly known as:  OCEAN  Place 1 spray into both nostrils as needed for congestion.     traMADol 50 MG tablet  Commonly known as:  ULTRAM  Take 1 tablet (50 mg total) by mouth every 6 (six) hours as needed.       Follow-up Information    Follow up with Leslye Peer., MD On 01/22/2014.   Specialty:  Pulmonary Disease   Why:  at 245   Contact information:   [address] [place] [address] [phone]       Discharged Condition: good  Signed: BABCOCK,PETE 01/16/2014, 9:23 AM   Physician Statement:   The Patient was personally examined, the discharge assessment and plan has been personally reviewed and I agree with ACNP Babcock's assessment and plan. > 30 minutes of time have been dedicated to discharge assessment, planning  and discharge instructions.   Levy Pupa, MD, PhD 01/16/2014, 1:05 PM [facility] [phone] or if no answer [phone]

## 2014-01-14 NOTE — Progress Notes (Signed)
   Name: [name] MRN: [id number] DOB: May 31, 1965    ADMISSION DATE:  01/09/2014   REFERRING MD :  [name]   CHIEF COMPLAINT:  Respiratory failure   BRIEF PATIENT DESCRIPTION:  49yo male with hx sarcoidosis (dx by tbbx in 2002), OSA on CPAP +/- ?asthma.  Presented 12/30 with c/o SOB since 12/25 as well as cough, non productive.  No fevers, chills.  Just seen as outpt (RB) and given abx and pred taper without improvement.  Worsening of SOB over last few days.  Was extremely dyspneic on arrival to ER and placed on bipap, improved quickly, no further bipap needed.  PCCM called to admit.   SIGNIFICANT EVENTS  12/30 > 12/31 steroids, near continuous BIPAP use 1/1 > slight improvement, near continuous CPAP use 1/2 > walked around unit  STUDIES:  2D echo 12/31>>> LVEF 60-65%, grade 1 diastolic dysfunction, mild pulmonary regurgitation  SUBJECTIVE:  Has improved over last 48h Concerned about some wt gain and abd swelling Wheeze has improved  VITAL SIGNS: Temp:  [97.4 F (36.3 C)-98.4 F (36.9 C)] 98.4 F (36.9 C) (01/04 0420) Pulse Rate:  [77-99] 79 (01/04 0420) Resp:  [18-28] 20 (01/04 0420) BP: (139-155)/(89-107) 152/98 mmHg (01/04 0420) SpO2:  [96 %-100 %] 98 % (01/04 0900) FiO2 (%):  [30 %] 30 % (01/03 1217) Weight:  [85.821 kg (189 lb 3.2 oz)] 85.821 kg (189 lb 3.2 oz) (01/04 0700)  PHYSICAL EXAMINATION: General:  Sitting up in bed, comfortable, no distress HEENT: NCAT, PULM: good air movement, no wheeze 1/4 CV: RRR, no mgr AB: distended, BS+, nontender Ext: trace ankle edema, warm Neuro: A&Ox4, maew    Recent Labs Lab 01/11/14 0402 01/12/14 0354 01/14/14 0413  NA 137 136 139  K 4.5 3.8 4.5  CL 101 96 98  CO2 27 34* 31  BUN 21 29* 23  CREATININE 0.92 0.93 0.87  GLUCOSE 165* 170* 147*    Recent Labs Lab 01/09/14 1745 01/10/14 0410 01/11/14 0402  HGB 11.5* 11.8* 11.5*  HCT 36.5* 37.8* 36.1*  WBC 11.7* 12.8* 14.0*  PLT 252 272 264   No results  found. 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) Plan:  CPAP qHS 10cm H20 Ambulate as able > PT consult Continue flutter valve and scheduled mucinex Convert solumedrol to pred 1/4 and follow Convert scheduled duonebs and budesonide to home symbicort (substitue dulera) Maintain off of antibiotics, monitor for change in sputum quality, fever, etc O2 as needed to maintain O2 saturation > 90%  Hx HTN  P:  Cont home metoprolol, cardizem  Consider adding additional BP med if no improvement with weaning steroids Add back low dose lasix - consider daily scheduled dose instead of prn for wt change  Abdominal bloating> due to aerophagia from CPAP, improved with nasal mask, improved Constipation P: Nasal CPAP mask Diet as tolerated Bowel regimen  Hopefully home 1/5  Levy Pupa, MD, PhD 01/14/2014, 10:46 AM [facility] [phone] or if no answer [phone]

## 2014-01-15 ENCOUNTER — Inpatient Hospital Stay (HOSPITAL_COMMUNITY): Payer: 59

## 2014-01-15 LAB — BASIC METABOLIC PANEL
Anion gap: 9 (ref 5–15)
BUN: 28 mg/dL — AB (ref 6–23)
CALCIUM: 8.7 mg/dL (ref 8.4–10.5)
CO2: 32 mmol/L (ref 19–32)
CREATININE: 0.93 mg/dL (ref 0.50–1.35)
Chloride: 95 mEq/L — ABNORMAL LOW (ref 96–112)
GFR calc Af Amer: >90 mL/min (ref >90)
GFR calc non Af Amer: >90 mL/min (ref >90)
GLUCOSE: 171 mg/dL — AB (ref 70–99)
POTASSIUM: 4 mmol/L (ref 3.5–5.1)
SODIUM: 136 mmol/L (ref 135–145)

## 2014-01-15 LAB — CULTURE, RESPIRATORY W GRAM STAIN: Culture: NORMAL

## 2014-01-15 LAB — CULTURE, RESPIRATORY

## 2014-01-15 MED ORDER — SIMETHICONE 80 MG PO CHEW
160.0000 mg | CHEWABLE_TABLET | Freq: Four times a day (QID) | ORAL | Status: DC
Start: 2014-01-15 — End: 2014-01-16
  Administered 2014-01-15 – 2014-01-16 (×5): 160 mg via ORAL
  Filled 2014-01-15 (×8): qty 2

## 2014-01-15 MED ORDER — TRAMADOL HCL 50 MG PO TABS
100.0000 mg | ORAL_TABLET | Freq: Four times a day (QID) | ORAL | Status: DC | PRN
  Administered 2014-01-15 – 2014-01-16 (×2): 100 mg via ORAL
  Filled 2014-01-15 (×2): qty 2

## 2014-01-15 MED ORDER — MAGNESIUM CITRATE PO SOLN
1.0000 | Freq: Once | ORAL | Status: AC
  Administered 2014-01-15: 1 via ORAL

## 2014-01-15 NOTE — Progress Notes (Addendum)
Name: [name] MRN: [id number] DOB: 04/23/1965    ADMISSION DATE:  01/09/2014   REFERRING MD :  [name]   CHIEF COMPLAINT:  Respiratory failure   BRIEF PATIENT DESCRIPTION:  49yo male with hx sarcoidosis (dx by tbbx in 2002), OSA on CPAP +/- ?asthma.  Presented 12/30 with c/o SOB since 12/25 as well as cough, non productive.  No fevers, chills.  Just seen as outpt (RB) and given abx and pred taper without improvement.  Worsening of SOB over last few days.  Was extremely dyspneic on arrival to ER and placed on bipap, improved quickly, no further bipap needed.  PCCM called to admit.   SIGNIFICANT EVENTS  12/30 > 12/31 steroids, near continuous BIPAP use 1/1 > slight improvement, near continuous CPAP use 1/2 > walked around unit  STUDIES:  2D echo 12/31>>> LVEF 60-65%, grade 1 diastolic dysfunction, mild pulmonary regurgitation  SUBJECTIVE:  abd distention and bloating contributing to persistent dyspnea. Denies diarrhea.   VITAL SIGNS: Temp:  [97.5 F (36.4 C)-98.1 F (36.7 C)] 98.1 F (36.7 C) (01/05 0459) Pulse Rate:  [78-104] 81 (01/05 0459) Resp:  [20-22] 20 (01/05 0459) BP: (149-159)/(90-98) 149/90 mmHg (01/05 0459) SpO2:  [97 %-100 %] 98 % (01/05 1026) Weight:  [86.002 kg (189 lb 9.6 oz)] 86.002 kg (189 lb 9.6 oz) (01/05 0459)  PHYSICAL EXAMINATION: General:  Sitting up in bed, comfortable, no distress HEENT: NCAT, PULM: good air movement, exp wheeze and + upper airway component  CV: RRR, no mgr AB: distended, BS+, nontender, more dull to percussion than expected  Ext: trace ankle edema, warm Neuro: A&Ox4, maew    Recent Labs Lab 01/12/14 0354 01/14/14 0413 01/15/14 0410  NA 136 139 136  K 3.8 4.5 4.0  CL 96 98 95*  CO2 34* 31 32  BUN 29* 23 28*  CREATININE 0.93 0.87 0.93  GLUCOSE 170* 147* 171*    Recent Labs Lab 01/09/14 1745 01/10/14 0410 01/11/14 0402  HGB 11.5* 11.8* 11.5*  HCT 36.5* 37.8* 36.1*  WBC 11.7* 12.8* 14.0*  PLT 252 272 264    No results found. 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 Also consider recent anticholinergic effect from Atrovent.  improved with nasal mask, improved Constipation D/c'd atrovent/ duoneb 1/4 P: Nasal CPAP mask Diet as tolerated Bowel regimen Get flat plat of abd Trial simethacone.   Will keep him another day. Get flat plat of abd. Trial Simethacone. Not sure how much of this is truly gas. Either way his abd is a significant contributor to his dyspnea.   Simonne MartinetPeter E Babcock ACNP-BC [facility] Pager # [phone] OR # [phone] if no answer \   Levy Pupaobert Maron Stanzione, MD, PhD  01/15/2014, 5:50 PM [facility] [phone] or if no answer [phone]

## 2014-01-15 NOTE — Progress Notes (Signed)
RT went to set up patient on CPAP). Patient not ready. Encouraged patient to let RN know when he was ready so RN could call RT.

## 2014-01-15 NOTE — Evaluation (Signed)
Physical Therapy Evaluation Patient Details Name: [name] MRN: [id number] DOB: December 25, 1965 Today's Date: 01/15/2014   History of Present Illness  49yo male with hx sarcoidosis (dx by tbbx in 2002), OSA on CPAP +/- ?asthma.  Presented 12/30 with c/o SOB since 12/25 as well as cough, non productive.  No fevers, chills.  Just seen as outpt (RB) and given abx and pred taper without improvement.  Worsening of SOB over last few days.  Was extremely dyspneic on arrival to ER and placed on bipap, improved quickly, no further bipap needed.   Clinical Impression  Initiated PT eval and pt moving at MOD I level in bed and with standing, but pt in 8/10 abd pain and unable to ambulate. Will follow up with patient after pain meds to further assess gait.  It is of note pt has full flight of stairs to get to his bedroom.    Follow Up Recommendations Home health PT    Equipment Recommendations  None recommended by PT    Recommendations for Other Services       Precautions / Restrictions        Mobility  Bed Mobility Overal bed mobility: Modified Independent                Transfers Overall transfer level: Modified independent                  Ambulation/Gait             General Gait Details: Pt deferred gait.  Stairs            Wheelchair Mobility    Modified Rankin (Stroke Patients Only)       Balance Overall balance assessment: No apparent balance deficits (not formally assessed)                                           Pertinent Vitals/Pain Pain Assessment: 0-10 Pain Score: 8  Pain Location: abd Pain Descriptors / Indicators: Other (Comment) ("pain")    Home Living Family/patient expects to be discharged to:: Private residence Living Arrangements: Spouse/significant other   Type of Home: House Home Access: Stairs to enter Entrance Stairs-Rails: None Entrance Stairs-Number of Steps: 4 Home Layout: Two level;Bed/bath  upstairs Home Equipment: Other (comment);Cane - single point;Crutches;Walker - 2 wheels;Wheelchair - manual (o2)      Prior Function Level of Independence: Independent               Hand Dominance   Dominant Hand: Right    Extremity/Trunk Assessment   Upper Extremity Assessment: Overall WFL for tasks assessed           Lower Extremity Assessment: Overall WFL for tasks assessed      Cervical / Trunk Assessment: Normal  Communication   Communication: No difficulties  Cognition Arousal/Alertness: Awake/alert Behavior During Therapy: WFL for tasks assessed/performed Overall Cognitive Status: Within Functional Limits for tasks assessed                      General Comments      Exercises        Assessment/Plan    PT Assessment Patient needs continued PT services  PT Diagnosis Acute pain   PT Problem List Decreased activity tolerance;Pain;Decreased balance  PT Treatment Interventions Gait training;Functional mobility training;Stair training;Therapeutic activities;Therapeutic exercise   PT Goals (Current goals can be found  in the Care Plan section) Acute Rehab PT Goals Patient Stated Goal: To go home PT Goal Formulation: With patient Time For Goal Achievement: 01/22/14 Potential to Achieve Goals: Good    Frequency Min 3X/week   Barriers to discharge        Co-evaluation               End of Session Equipment Utilized During Treatment: Oxygen Activity Tolerance: Patient limited by pain Patient left: in bed           Time: [id number] PT Time Calculation (min) (ACUTE ONLY): 15 min   Charges:   PT Evaluation $Initial PT Evaluation Tier I: 1 Procedure     PT G Codes:        Melodye Swor LUBECK 01/15/2014, 11:15 AM

## 2014-01-15 NOTE — Progress Notes (Signed)
PT Cancellation Note  Patient Details Name: [name] MRN: [id number] DOB: 03/02/1965   Cancelled Treatment:    Reason Eval/Treat Not Completed: Attempted to assess gait after pt's requested PT to come back later in the AM, and he politely declined stating he was hurting too bad in his abd.  Abdomen visiblly distended. Will check back as able to assess gait and possibly stairs.   Reena Borromeo LUBECK 01/15/2014, 11:28 AM

## 2014-01-15 NOTE — Progress Notes (Signed)
Spoke with pt regarding cpap.  Pt stated he doesn't want to go on cpap until after midnight.  This RT will follow-up with pt at that time.

## 2014-01-16 LAB — CULTURE, BLOOD (ROUTINE X 2)
CULTURE: NO GROWTH
Culture: NO GROWTH

## 2014-01-16 MED ORDER — BISACODYL 5 MG PO TBEC: 5.0000 mg | DELAYED_RELEASE_TABLET | Freq: Every day | ORAL | Status: DC | PRN

## 2014-01-16 MED ORDER — PREDNISONE 10 MG PO TABS: ORAL_TABLET | ORAL | Status: DC

## 2014-01-16 MED ORDER — POLYETHYLENE GLYCOL 3350 17 G PO PACK: 17.0000 g | PACK | Freq: Every day | ORAL | Status: DC

## 2014-01-16 NOTE — Progress Notes (Signed)
Pt awake, watching tv, not ready for cpap at this time.  Pt was encouraged to call/let his nurse know when ready for bed/cpap.

## 2014-01-16 NOTE — Progress Notes (Signed)
Pt discharged home with spouse in stable condition. Discharge instructions given. Scripts send to pharmacy to choice. Pt verbalized understanding

## 2014-01-16 NOTE — Care Management Note (Signed)
CARE MANAGEMENT NOTE 01/16/2014  Patient:  [name]   Account Number:  [id number]  Date Initiated:  01/14/2014  Documentation initiated by:  Marney Doctor  Subjective/Objective Assessment:   49 yo admitted with COPD     Action/Plan:   From home with girlfriend   Anticipated DC Date:  01/16/2014   Anticipated DC Plan:  [place]  CM consult      Choice offered to / List presented to:             Status of service:  In process, will continue to follow Medicare Important Message given?   (If response is "NO", the following Medicare IM given date fields will be blank) Date Medicare IM given:   Medicare IM given by:   Date Additional Medicare IM given:   Additional Medicare IM given by:    Discharge Disposition:    Per UR Regulation:  Reviewed for med. necessity/level of care/duration of stay  If discussed at [place] of Stay Meetings, dates discussed:   01/15/2014    Comments:  01/16/14 Marney Doctor RN,BSN PT recommended HHPT. Met with pt and significant other in the room on 01/15/14 to offer choice for HHPT.  PT currently has home 02, cpap, and nebulizer machine at home.  Pt given list of [place] providers and did not want to make a decision at that time because he was eating lunch.  This CM came back later in the afternoon and pt continued to not want to make a decision for [facility].  When asked pt stated that he did want HHPT but he would let me know later about a [place]. This CM checked back with the pt this am after pt already had dc order.  Pt states he still did not want to look at the list right now to make a decision.  Pt asked if he was leaving soon and he states that yes he was, and this CM informed him that I would be happy to set up [facility] for him but he would need to make a decision while here in the hospital.  Pt states he is not ready to do that.  I spoke with pts RN and asked her to mention it to him again and let me know if he made a decision.   This CM called the RN when I noticed he had been DC'd and the nurse stated that he would call us back and let us know who he chose when he got home.  This CM takes this as a decline of [place] services.  01/14/14 Marney Doctor RN,BSN,NCM [phone] Chart reviewed and CM following for DC needs.  PT eval has been ordered and this CM will await recommendations and assist with DC planning as needed.

## 2014-01-17 ENCOUNTER — Encounter (HOSPITAL_COMMUNITY): Payer: Self-pay | Admitting: Emergency Medicine

## 2014-01-17 ENCOUNTER — Inpatient Hospital Stay (HOSPITAL_COMMUNITY)
Admission: EM | Admit: 2014-01-17 | Discharge: 2014-01-28 | DRG: 871 | Disposition: A | Discharge status: Home Health | Payer: 59 | Attending: Internal Medicine | Admitting: Internal Medicine

## 2014-01-17 ENCOUNTER — Inpatient Hospital Stay (HOSPITAL_COMMUNITY): Payer: 59

## 2014-01-17 ENCOUNTER — Emergency Department (HOSPITAL_COMMUNITY): Payer: 59

## 2014-01-17 DIAGNOSIS — J479 Bronchiectasis, uncomplicated: Secondary | ICD-10-CM | POA: Diagnosis present

## 2014-01-17 DIAGNOSIS — R5381 Other malaise: Secondary | ICD-10-CM | POA: Diagnosis present

## 2014-01-17 DIAGNOSIS — Z79899 Other long term (current) drug therapy: Secondary | ICD-10-CM

## 2014-01-17 DIAGNOSIS — Z9981 Dependence on supplemental oxygen: Secondary | ICD-10-CM | POA: Diagnosis not present

## 2014-01-17 DIAGNOSIS — R0603 Acute respiratory distress: Secondary | ICD-10-CM

## 2014-01-17 DIAGNOSIS — Z79891 Long term (current) use of opiate analgesic: Secondary | ICD-10-CM | POA: Diagnosis not present

## 2014-01-17 DIAGNOSIS — M79672 Pain in left foot: Secondary | ICD-10-CM | POA: Diagnosis not present

## 2014-01-17 DIAGNOSIS — Z7952 Long term (current) use of systemic steroids: Secondary | ICD-10-CM | POA: Diagnosis not present

## 2014-01-17 DIAGNOSIS — J9601 Acute respiratory failure with hypoxia: Secondary | ICD-10-CM

## 2014-01-17 DIAGNOSIS — B974 Respiratory syncytial virus as the cause of diseases classified elsewhere: Secondary | ICD-10-CM | POA: Diagnosis present

## 2014-01-17 DIAGNOSIS — Z881 Allergy status to other antibiotic agents status: Secondary | ICD-10-CM | POA: Diagnosis not present

## 2014-01-17 DIAGNOSIS — T50905A Adverse effect of unspecified drugs, medicaments and biological substances, initial encounter: Secondary | ICD-10-CM | POA: Diagnosis present

## 2014-01-17 DIAGNOSIS — E669 Obesity, unspecified: Secondary | ICD-10-CM | POA: Diagnosis present

## 2014-01-17 DIAGNOSIS — K5900 Constipation, unspecified: Secondary | ICD-10-CM | POA: Diagnosis present

## 2014-01-17 DIAGNOSIS — G4733 Obstructive sleep apnea (adult) (pediatric): Secondary | ICD-10-CM | POA: Diagnosis present

## 2014-01-17 DIAGNOSIS — R600 Localized edema: Secondary | ICD-10-CM | POA: Diagnosis present

## 2014-01-17 DIAGNOSIS — L52 Erythema nodosum: Secondary | ICD-10-CM | POA: Diagnosis present

## 2014-01-17 DIAGNOSIS — J449 Chronic obstructive pulmonary disease, unspecified: Secondary | ICD-10-CM | POA: Diagnosis present

## 2014-01-17 DIAGNOSIS — J45909 Unspecified asthma, uncomplicated: Secondary | ICD-10-CM | POA: Diagnosis present

## 2014-01-17 DIAGNOSIS — K219 Gastro-esophageal reflux disease without esophagitis: Secondary | ICD-10-CM | POA: Diagnosis present

## 2014-01-17 DIAGNOSIS — R0600 Dyspnea, unspecified: Secondary | ICD-10-CM | POA: Diagnosis present

## 2014-01-17 DIAGNOSIS — L819 Disorder of pigmentation, unspecified: Secondary | ICD-10-CM

## 2014-01-17 DIAGNOSIS — Z6829 Body mass index (BMI) 29.0-29.9, adult: Secondary | ICD-10-CM | POA: Diagnosis not present

## 2014-01-17 DIAGNOSIS — Y95 Nosocomial condition: Secondary | ICD-10-CM | POA: Diagnosis present

## 2014-01-17 DIAGNOSIS — D860 Sarcoidosis of lung: Secondary | ICD-10-CM | POA: Diagnosis present

## 2014-01-17 DIAGNOSIS — R238 Other skin changes: Secondary | ICD-10-CM | POA: Diagnosis not present

## 2014-01-17 DIAGNOSIS — Z9989 Dependence on other enabling machines and devices: Secondary | ICD-10-CM

## 2014-01-17 DIAGNOSIS — I7389 Other specified peripheral vascular diseases: Secondary | ICD-10-CM | POA: Diagnosis present

## 2014-01-17 DIAGNOSIS — R233 Spontaneous ecchymoses: Secondary | ICD-10-CM | POA: Diagnosis not present

## 2014-01-17 DIAGNOSIS — A419 Sepsis, unspecified organism: Principal | ICD-10-CM | POA: Diagnosis present

## 2014-01-17 DIAGNOSIS — I10 Essential (primary) hypertension: Secondary | ICD-10-CM | POA: Diagnosis present

## 2014-01-17 DIAGNOSIS — J189 Pneumonia, unspecified organism: Secondary | ICD-10-CM | POA: Diagnosis present

## 2014-01-17 DIAGNOSIS — M79671 Pain in right foot: Secondary | ICD-10-CM | POA: Diagnosis not present

## 2014-01-17 DIAGNOSIS — Z833 Family history of diabetes mellitus: Secondary | ICD-10-CM

## 2014-01-17 DIAGNOSIS — D869 Sarcoidosis, unspecified: Secondary | ICD-10-CM

## 2014-01-17 DIAGNOSIS — R739 Hyperglycemia, unspecified: Secondary | ICD-10-CM | POA: Diagnosis present

## 2014-01-17 DIAGNOSIS — J9621 Acute and chronic respiratory failure with hypoxia: Secondary | ICD-10-CM | POA: Diagnosis present

## 2014-01-17 DIAGNOSIS — J40 Bronchitis, not specified as acute or chronic: Secondary | ICD-10-CM | POA: Diagnosis present

## 2014-01-17 LAB — BLOOD GAS, ARTERIAL
Acid-Base Excess: 6.6 mmol/L — ABNORMAL HIGH (ref 0.0–2.0)
Bicarbonate: 30.9 mEq/L — ABNORMAL HIGH (ref 20.0–24.0)
DRAWN BY: 257701
O2 CONTENT: 3 L/min
O2 Saturation: 90 %
PATIENT TEMPERATURE: 98.6
PH ART: 7.458 — AB (ref 7.350–7.450)
TCO2: 27.4 mmol/L (ref 0–100)
pCO2 arterial: 44.3 mmHg (ref 35.0–45.0)
pO2, Arterial: 60.7 mmHg — ABNORMAL LOW (ref 80.0–100.0)

## 2014-01-17 LAB — CBC WITH DIFFERENTIAL/PLATELET
BASOS PCT: 0 % (ref 0–1)
Basophils Absolute: 0 10*3/uL (ref 0.0–0.1)
EOS PCT: 0 % (ref 0–5)
Eosinophils Absolute: 0 10*3/uL (ref 0.0–0.7)
HCT: 41 % (ref 39.0–52.0)
Hemoglobin: 12.5 g/dL — ABNORMAL LOW (ref 13.0–17.0)
LYMPHS ABS: 1.2 10*3/uL (ref 0.7–4.0)
Lymphocytes Relative: 4 % — ABNORMAL LOW (ref 12–46)
MCH: 26.7 pg (ref 26.0–34.0)
MCHC: 30.5 g/dL (ref 30.0–36.0)
MCV: 87.6 fL (ref 78.0–100.0)
MONOS PCT: 6 % (ref 3–12)
Monocytes Absolute: 1.8 10*3/uL — ABNORMAL HIGH (ref 0.1–1.0)
NEUTROS ABS: 27.1 10*3/uL — AB (ref 1.7–7.7)
Neutrophils Relative %: 90 % — ABNORMAL HIGH (ref 43–77)
Platelets: 288 10*3/uL (ref 150–400)
RBC: 4.68 MIL/uL (ref 4.22–5.81)
RDW: 14 % (ref 11.5–15.5)
WBC: 30.1 10*3/uL — ABNORMAL HIGH (ref 4.0–10.5)

## 2014-01-17 LAB — GLUCOSE, CAPILLARY
GLUCOSE-CAPILLARY: 154 mg/dL — AB (ref 70–99)
GLUCOSE-CAPILLARY: 180 mg/dL — AB (ref 70–99)
Glucose-Capillary: 184 mg/dL — ABNORMAL HIGH (ref 70–99)

## 2014-01-17 LAB — COMPREHENSIVE METABOLIC PANEL
ALT: 57 U/L — AB (ref 0–53)
ANION GAP: 11 (ref 5–15)
AST: 72 U/L — ABNORMAL HIGH (ref 0–37)
Albumin: 3.7 g/dL (ref 3.5–5.2)
Alkaline Phosphatase: 91 U/L (ref 39–117)
BUN: 24 mg/dL — ABNORMAL HIGH (ref 6–23)
CO2: 33 mmol/L — AB (ref 19–32)
Calcium: 8.8 mg/dL (ref 8.4–10.5)
Chloride: 92 mEq/L — ABNORMAL LOW (ref 96–112)
Creatinine, Ser: 1.14 mg/dL (ref 0.50–1.35)
GFR calc Af Amer: 86 mL/min — ABNORMAL LOW (ref >90)
GFR, EST NON AFRICAN AMERICAN: 74 mL/min — AB (ref >90)
GLUCOSE: 108 mg/dL — AB (ref 70–99)
Potassium: 4.5 mmol/L (ref 3.5–5.1)
SODIUM: 136 mmol/L (ref 135–145)
Total Bilirubin: 1 mg/dL (ref 0.3–1.2)
Total Protein: 7.2 g/dL (ref 6.0–8.3)

## 2014-01-17 LAB — PROCALCITONIN: Procalcitonin: 2.11 ng/mL

## 2014-01-17 LAB — CBG MONITORING, ED: Glucose-Capillary: 98 mg/dL (ref 70–99)

## 2014-01-17 LAB — I-STAT TROPONIN, ED: Troponin i, poc: 0.01 ng/mL (ref 0.00–0.08)

## 2014-01-17 LAB — I-STAT CG4 LACTIC ACID, ED: LACTIC ACID, VENOUS: 2.73 mmol/L — AB (ref 0.5–2.2)

## 2014-01-17 LAB — BRAIN NATRIURETIC PEPTIDE: B Natriuretic Peptide: 492.9 pg/mL — ABNORMAL HIGH (ref 0.0–100.0)

## 2014-01-17 MED ORDER — CETYLPYRIDINIUM CHLORIDE 0.05 % MT LIQD
7.0000 mL | Freq: Two times a day (BID) | OROMUCOSAL | Status: DC
  Administered 2014-01-17 – 2014-01-18 (×4): 7 mL via OROMUCOSAL

## 2014-01-17 MED ORDER — PREGABALIN 100 MG PO CAPS
200.0000 mg | ORAL_CAPSULE | Freq: Two times a day (BID) | ORAL | Status: DC | PRN
  Administered 2014-01-21 – 2014-01-26 (×5): 200 mg via ORAL
  Filled 2014-01-17 (×6): qty 2

## 2014-01-17 MED ORDER — ALBUTEROL SULFATE (2.5 MG/3ML) 0.083% IN NEBU
2.5000 mg | INHALATION_SOLUTION | RESPIRATORY_TRACT | Status: DC
  Administered 2014-01-17 – 2014-01-28 (×64): 2.5 mg via RESPIRATORY_TRACT
  Filled 2014-01-17 (×66): qty 3

## 2014-01-17 MED ORDER — VANCOMYCIN HCL IN DEXTROSE 1-5 GM/200ML-% IV SOLN
1000.0000 mg | Freq: Once | INTRAVENOUS | Status: AC
  Administered 2014-01-17: 1000 mg via INTRAVENOUS
  Filled 2014-01-17: qty 200

## 2014-01-17 MED ORDER — MORPHINE SULFATE 2 MG/ML IJ SOLN
2.0000 mg | Freq: Once | INTRAMUSCULAR | Status: AC
  Administered 2014-01-17: 2 mg via INTRAVENOUS
  Filled 2014-01-17: qty 1

## 2014-01-17 MED ORDER — IPRATROPIUM BROMIDE 0.02 % IN SOLN
1.0000 mg | Freq: Once | RESPIRATORY_TRACT | Status: AC
  Administered 2014-01-17: 1 mg via RESPIRATORY_TRACT
  Filled 2014-01-17: qty 5

## 2014-01-17 MED ORDER — INSULIN ASPART 100 UNIT/ML ~~LOC~~ SOLN
0.0000 [IU] | Freq: Three times a day (TID) | SUBCUTANEOUS | Status: DC
  Administered 2014-01-17 (×2): 2 [IU] via SUBCUTANEOUS
  Administered 2014-01-18: 1 [IU] via SUBCUTANEOUS
  Administered 2014-01-18: 5 [IU] via SUBCUTANEOUS
  Administered 2014-01-18: 2 [IU] via SUBCUTANEOUS
  Administered 2014-01-19: 1 [IU] via SUBCUTANEOUS
  Administered 2014-01-19: 21 [IU] via SUBCUTANEOUS
  Administered 2014-01-19: 2 [IU] via SUBCUTANEOUS
  Administered 2014-01-20: 1 [IU] via SUBCUTANEOUS
  Administered 2014-01-20: 2 [IU] via SUBCUTANEOUS
  Administered 2014-01-20: 1 [IU] via SUBCUTANEOUS
  Administered 2014-01-21: 2 [IU] via SUBCUTANEOUS
  Administered 2014-01-22: 1 [IU] via SUBCUTANEOUS
  Administered 2014-01-22 – 2014-01-27 (×9): 2 [IU] via SUBCUTANEOUS

## 2014-01-17 MED ORDER — ALBUTEROL SULFATE (2.5 MG/3ML) 0.083% IN NEBU: 2.5000 mg | INHALATION_SOLUTION | RESPIRATORY_TRACT | Status: DC | PRN

## 2014-01-17 MED ORDER — METHYLPREDNISOLONE SODIUM SUCC 125 MG IJ SOLR
125.0000 mg | Freq: Once | INTRAMUSCULAR | Status: AC
  Administered 2014-01-17: 125 mg via INTRAVENOUS
  Filled 2014-01-17: qty 2

## 2014-01-17 MED ORDER — PIPERACILLIN-TAZOBACTAM 3.375 G IVPB 30 MIN
3.3750 g | Freq: Once | INTRAVENOUS | Status: AC
  Administered 2014-01-17: 3.375 g via INTRAVENOUS
  Filled 2014-01-17: qty 50

## 2014-01-17 MED ORDER — SODIUM CHLORIDE 0.9 % IV BOLUS (SEPSIS)
1500.0000 mL | Freq: Once | INTRAVENOUS | Status: AC
  Administered 2014-01-17: 1500 mL via INTRAVENOUS

## 2014-01-17 MED ORDER — METHYLPREDNISOLONE SODIUM SUCC 125 MG IJ SOLR
60.0000 mg | Freq: Four times a day (QID) | INTRAMUSCULAR | Status: DC
  Administered 2014-01-17 – 2014-01-18 (×3): 60 mg via INTRAVENOUS
  Filled 2014-01-17 (×4): qty 2

## 2014-01-17 MED ORDER — DILTIAZEM HCL 30 MG PO TABS: 30.0000 mg | ORAL_TABLET | Freq: Three times a day (TID) | ORAL | Status: DC

## 2014-01-17 MED ORDER — HYDROCOD POLST-CHLORPHEN POLST 10-8 MG/5ML PO LQCR
5.0000 mL | Freq: Two times a day (BID) | ORAL | Status: DC | PRN
  Administered 2014-01-17 – 2014-01-23 (×13): 5 mL via ORAL
  Filled 2014-01-17 (×14): qty 5

## 2014-01-17 MED ORDER — CALCIUM CARBONATE-VITAMIN D 500-200 MG-UNIT PO TABS
1.0000 | ORAL_TABLET | Freq: Every morning | ORAL | Status: DC
  Administered 2014-01-17 – 2014-01-28 (×12): 1 via ORAL
  Filled 2014-01-17 (×13): qty 1

## 2014-01-17 MED ORDER — SODIUM CHLORIDE 0.9 % IV SOLN
INTRAVENOUS | Status: DC
  Administered 2014-01-17 – 2014-01-19 (×2): via INTRAVENOUS

## 2014-01-17 MED ORDER — CYCLOBENZAPRINE HCL 10 MG PO TABS
10.0000 mg | ORAL_TABLET | Freq: Three times a day (TID) | ORAL | Status: DC | PRN
  Administered 2014-01-18 – 2014-01-28 (×14): 10 mg via ORAL
  Filled 2014-01-17 (×14): qty 1

## 2014-01-17 MED ORDER — BUDESONIDE 0.5 MG/2ML IN SUSP
0.5000 mg | Freq: Two times a day (BID) | RESPIRATORY_TRACT | Status: DC
  Administered 2014-01-17 – 2014-01-28 (×23): 0.5 mg via RESPIRATORY_TRACT
  Filled 2014-01-17 (×23): qty 2

## 2014-01-17 MED ORDER — ALBUTEROL (5 MG/ML) CONTINUOUS INHALATION SOLN
10.0000 mg/h | INHALATION_SOLUTION | Freq: Once | RESPIRATORY_TRACT | Status: DC
  Administered 2014-01-17: 10 mg/h via RESPIRATORY_TRACT
  Filled 2014-01-17: qty 20

## 2014-01-17 MED ORDER — BISACODYL 5 MG PO TBEC
5.0000 mg | DELAYED_RELEASE_TABLET | Freq: Every day | ORAL | Status: DC | PRN
  Filled 2014-01-17: qty 1

## 2014-01-17 MED ORDER — CHLORHEXIDINE GLUCONATE 0.12 % MT SOLN
15.0000 mL | Freq: Two times a day (BID) | OROMUCOSAL | Status: DC
  Administered 2014-01-17 – 2014-01-19 (×4): 15 mL via OROMUCOSAL
  Filled 2014-01-17 (×4): qty 15

## 2014-01-17 MED ORDER — SENNOSIDES-DOCUSATE SODIUM 8.6-50 MG PO TABS
2.0000 | ORAL_TABLET | Freq: Two times a day (BID) | ORAL | Status: DC
  Administered 2014-01-17 – 2014-01-28 (×22): 2 via ORAL
  Filled 2014-01-17 (×3): qty 2
  Filled 2014-01-17: qty 1
  Filled 2014-01-17: qty 2
  Filled 2014-01-17: qty 1
  Filled 2014-01-17: qty 2
  Filled 2014-01-17: qty 1
  Filled 2014-01-17 (×15): qty 2
  Filled 2014-01-17: qty 1
  Filled 2014-01-17 (×3): qty 2

## 2014-01-17 MED ORDER — FAMOTIDINE 20 MG PO TABS
20.0000 mg | ORAL_TABLET | Freq: Two times a day (BID) | ORAL | Status: DC
  Administered 2014-01-17 – 2014-01-28 (×23): 20 mg via ORAL
  Filled 2014-01-17 (×25): qty 1

## 2014-01-17 MED ORDER — HEPARIN SODIUM (PORCINE) 5000 UNIT/ML IJ SOLN
5000.0000 [IU] | Freq: Three times a day (TID) | INTRAMUSCULAR | Status: DC
  Administered 2014-01-17 – 2014-01-22 (×15): 5000 [IU] via SUBCUTANEOUS
  Filled 2014-01-17 (×18): qty 1

## 2014-01-17 MED ORDER — VITAMIN D3 25 MCG (1000 UNIT) PO TABS
1000.0000 [IU] | ORAL_TABLET | Freq: Every morning | ORAL | Status: DC
  Administered 2014-01-17 – 2014-01-28 (×12): 1000 [IU] via ORAL
  Filled 2014-01-17 (×13): qty 1

## 2014-01-17 MED ORDER — GUAIFENESIN ER 600 MG PO TB12
1200.0000 mg | ORAL_TABLET | Freq: Two times a day (BID) | ORAL | Status: DC | PRN
  Administered 2014-01-17 – 2014-01-23 (×8): 1200 mg via ORAL
  Filled 2014-01-17 (×10): qty 2

## 2014-01-17 MED ORDER — LIDOCAINE 5 % EX PTCH
1.0000 | MEDICATED_PATCH | Freq: Every day | CUTANEOUS | Status: DC | PRN
  Filled 2014-01-17: qty 1

## 2014-01-17 MED ORDER — METOPROLOL TARTRATE 25 MG PO TABS: 50.0000 mg | ORAL_TABLET | Freq: Two times a day (BID) | ORAL | Status: DC

## 2014-01-17 MED ORDER — VANCOMYCIN HCL IN DEXTROSE 1-5 GM/200ML-% IV SOLN
1000.0000 mg | Freq: Three times a day (TID) | INTRAVENOUS | Status: DC
  Administered 2014-01-17 – 2014-01-22 (×14): 1000 mg via INTRAVENOUS
  Filled 2014-01-17 (×16): qty 200

## 2014-01-17 MED ORDER — PIPERACILLIN-TAZOBACTAM 3.375 G IVPB
3.3750 g | Freq: Three times a day (TID) | INTRAVENOUS | Status: DC
  Administered 2014-01-17 – 2014-01-22 (×15): 3.375 g via INTRAVENOUS
  Filled 2014-01-17 (×14): qty 50

## 2014-01-17 MED ORDER — ADULT MULTIVITAMIN W/MINERALS CH
1.0000 | ORAL_TABLET | Freq: Every morning | ORAL | Status: DC
  Administered 2014-01-17 – 2014-01-28 (×12): 1 via ORAL
  Filled 2014-01-17 (×13): qty 1

## 2014-01-17 MED ORDER — FERROUS SULFATE 325 (65 FE) MG PO TABS
325.0000 mg | ORAL_TABLET | Freq: Two times a day (BID) | ORAL | Status: DC
  Administered 2014-01-17 – 2014-01-20 (×7): 325 mg via ORAL
  Filled 2014-01-17 (×9): qty 1

## 2014-01-17 MED ORDER — MORPHINE SULFATE 2 MG/ML IJ SOLN
2.0000 mg | INTRAMUSCULAR | Status: DC | PRN
  Administered 2014-01-18 – 2014-01-24 (×15): 2 mg via INTRAVENOUS
  Filled 2014-01-17 (×16): qty 1

## 2014-01-17 MED ORDER — ALBUTEROL (5 MG/ML) CONTINUOUS INHALATION SOLN: 10.0000 mg/h | INHALATION_SOLUTION | Freq: Once | RESPIRATORY_TRACT | Status: DC

## 2014-01-17 MED ORDER — ACETAMINOPHEN 325 MG PO TABS
650.0000 mg | ORAL_TABLET | Freq: Once | ORAL | Status: AC
  Administered 2014-01-17: 650 mg via ORAL
  Filled 2014-01-17: qty 2

## 2014-01-17 MED ORDER — SODIUM CHLORIDE 0.9 % IV BOLUS (SEPSIS)
1000.0000 mL | Freq: Once | INTRAVENOUS | Status: AC
  Administered 2014-01-17: 1000 mL via INTRAVENOUS

## 2014-01-17 MED ORDER — ARFORMOTEROL TARTRATE 15 MCG/2ML IN NEBU
15.0000 ug | INHALATION_SOLUTION | Freq: Two times a day (BID) | RESPIRATORY_TRACT | Status: DC
  Administered 2014-01-17 – 2014-01-28 (×23): 15 ug via RESPIRATORY_TRACT
  Filled 2014-01-17 (×30): qty 2

## 2014-01-17 NOTE — ED Notes (Signed)
Pt from home. Pt was discharged from hospital last night for acute respiratory failure, COPD exacerbation, RSV infection and sarcoidosis. Pt began to have sob last night, took home breathing tx, felt better, but sob gradually became worse. Pt had audible wheezing upon EMS arrival. EMS gave duoneb treatment prior to arrival. Pt sob upon arrival with accessory muscle use. Hx of a fib, HR 148 on arrival. On 3L  at home.

## 2014-01-17 NOTE — ED Notes (Signed)
Bed: [id number] Expected date:  Expected time:  Means of arrival:  Comments: Ems- 49 yo M, wheezing, blood tinged sputum last night

## 2014-01-17 NOTE — ED Notes (Signed)
RT called

## 2014-01-17 NOTE — Progress Notes (Signed)
ANTIBIOTIC CONSULT NOTE - INITIAL  Pharmacy Consult for Vancomycin, Zosyn, may renally-adjust abx Indication: HCAP, sepsis  Allergies  Allergen Reactions  . Clindamycin/Lincomycin Anaphylaxis and Swelling    Made face and tongue swell    Patient Measurements:   Adjusted Body Weight:   Vital Signs: Temp: 102.3 F (39.1 C) (01/07 0854) Temp Source: Rectal (01/07 0854) BP: 109/69 mmHg (01/07 0757) Pulse Rate: 147 (01/07 0810) Intake/Output from previous day:   Intake/Output from this shift:    Labs:  Recent Labs  01/15/14 0410 01/17/14 0805  WBC  --  30.1*  HGB  --  12.5*  PLT  --  288  CREATININE 0.93  --    Estimated Creatinine Clearance: 101 mL/min (by C-G formula based on Cr of 0.93). No results for input(s): VANCOTROUGH, VANCOPEAK, VANCORANDOM, GENTTROUGH, GENTPEAK, GENTRANDOM, TOBRATROUGH, TOBRAPEAK, TOBRARND, AMIKACINPEAK, AMIKACINTROU, AMIKACIN in the last 72 hours.   Microbiology: Recent Results (from the past 720 hour(s))  Culture, blood (routine x 2)     Status: None   Collection Time: 01/09/14 12:17 PM  Result Value Ref Range Status   Specimen Description BLOOD RIGHT ANTECUBITAL  Final   Special Requests BOTTLES DRAWN AEROBIC AND ANAEROBIC 5ML  Final   Culture   Final    NO GROWTH 5 DAYS Performed at [facility]    Report Status 01/16/2014 FINAL  Final  Respiratory virus panel (routine influenza)     Status: Abnormal   Collection Time: 01/09/14  5:38 PM  Result Value Ref Range Status   Source - RVPAN NOSE  Final   Respiratory Syncytial Virus A DETECTED (A)  Final   Respiratory Syncytial Virus B NOT DETECTED  Final   Influenza A NOT DETECTED  Final   Influenza B NOT DETECTED  Final   Parainfluenza 1 NOT DETECTED  Final   Parainfluenza 2 NOT DETECTED  Final   Parainfluenza 3 NOT DETECTED  Final   Metapneumovirus NOT DETECTED  Final   Rhinovirus NOT DETECTED  Final   Adenovirus NOT DETECTED  Final   Influenza A H1 NOT DETECTED  Final   Influenza A H3 NOT DETECTED  Final    Comment: (NOTE)       Normal Reference Range for each Analyte: NOT DETECTED Testing performed using the Luminex xTAG Respiratory Viral Panel test kit. The analytical performance characteristics of this assay have been determined by [facility].  The modifications have not been cleared or approved by the FDA. This assay has been validated pursuant to the CLIA regulations and is used for clinical purposes. Performed at [facility], blood (routine x 2)     Status: None   Collection Time: 01/09/14  5:39 PM  Result Value Ref Range Status   Specimen Description BLOOD RIGHT ANTECUBITAL  Final   Special Requests BOTTLES DRAWN AEROBIC AND ANAEROBIC 3ML  Final   Culture   Final    NO GROWTH 5 DAYS Performed at [facility]    Report Status 01/16/2014 FINAL  Final  MRSA PCR Screening     Status: None   Collection Time: 01/09/14  6:36 PM  Result Value Ref Range Status   MRSA by PCR NEGATIVE NEGATIVE Final    Comment:        The GeneXpert MRSA Assay (FDA approved for NASAL specimens only), is one component of a comprehensive MRSA colonization surveillance program. It is not intended to diagnose MRSA infection nor to guide or monitor treatment for  MRSA infections.   Urine culture     Status: None   Collection Time: 01/09/14  6:43 PM  Result Value Ref Range Status   Specimen Description URINE, CLEAN CATCH  Final   Special Requests NONE  Final   Colony Count NO GROWTH Performed at [facility]   Final   Culture NO GROWTH Performed at [facility]   Final   Report Status 01/13/2014 FINAL  Final  Culture, expectorated sputum-assessment     Status: None   Collection Time: 01/11/14 10:00 PM  Result Value Ref Range Status   Specimen Description SPUTUM  Final   Special Requests NONE  Final   Sputum evaluation   Final    MICROSCOPIC FINDINGS SUGGEST THAT THIS SPECIMEN IS NOT REPRESENTATIVE OF  LOWER RESPIRATORY SECRETIONS. PLEASE RECOLLECT. NOTIFIED C.DENNY,RN AT 2237 ON 01/11/14 BY SHEA.W    Report Status 01/11/2014 FINAL  Final  Culture, expectorated sputum-assessment     Status: None   Collection Time: 01/12/14  2:13 PM  Result Value Ref Range Status   Specimen Description SPUTUM  Final   Special Requests Immunocompromised  Final   Sputum evaluation   Final    THIS SPECIMEN IS ACCEPTABLE. RESPIRATORY CULTURE REPORT TO FOLLOW.   Report Status 01/12/2014 FINAL  Final  Culture, respiratory (NON-Expectorated)     Status: None   Collection Time: 01/12/14  2:13 PM  Result Value Ref Range Status   Specimen Description SPUTUM  Final   Special Requests NONE  Final   Gram Stain   Final    RARE WBC PRESENT, PREDOMINANTLY MONONUCLEAR RARE SQUAMOUS EPITHELIAL CELLS PRESENT FEW GRAM POSITIVE COCCI IN PAIRS IN CLUSTERS Performed at [facility]    Culture   Final    NORMAL OROPHARYNGEAL FLORA Performed at [facility]    Report Status 01/15/2014 FINAL  Final    Medical History: Past Medical History  Diagnosis Date  . Hypertension   . Sarcoidosis   . OSA on CPAP   . Chronic respiratory failure     home oxygen PRN  . Bronchitis   . Pneumonia   . COPD (chronic obstructive pulmonary disease)   . Neuromuscular disorder     Medications:  Anti-infectives    Start     Dose/Rate Route Frequency Ordered Stop   01/17/14 0845  vancomycin (VANCOCIN) IVPB 1000 mg/200 mL premix     1,000 mg200 mL/hr over 60 Minutes Intravenous  Once 01/17/14 0836     01/17/14 0845  piperacillin-tazobactam (ZOSYN) IVPB 3.375 g     3.375 g100 mL/hr over 30 Minutes Intravenous  Once 01/17/14 0836       Assessment: 49yo M presenting 1/7 w/ worsening SOB, wheezing, and cough. Admitted 12/30-1/6 for acute asthmatic flare, +RSV. Pharmacy is asked to dose Vanc and Zosyn for HCAP/sepsis.    1/7 >> Zosyn >> 1/7 >> Vanc >>    Tmax: 102.3 WBCs:  Renal: SCr 0.93(1/5), CrCl 101  12/30  blood x 2: sent 12/30 urine: ordered  12/30 sputum: ordered 12/30 respiratory virus panel: +RSV A / blood: / urine:  / sputum:   Goal of Therapy:  Vancomycin trough level 15-20 mcg/ml  Appropriate antibiotic dosing for renal function; eradication of infection  Plan:  Vancomycin 1g IV q8h. Zosyn 3.375g IV Q8H infused over 4hrs. Measure Vanc trough at steady state. Follow up renal fxn and culture results.  Romeo Rabon, PharmD, pager [phone]. 01/17/2014,9:25 AM.

## 2014-01-17 NOTE — ED Notes (Signed)
Pt not tolerating bipap. Put pt on 6L [place].

## 2014-01-17 NOTE — Care Management Note (Signed)
    Page 1 of 1   01/22/2014     12:57:06 PM CARE MANAGEMENT NOTE 01/22/2014  Patient:  [name],[name]   Account Number:  [id number]  Date Initiated:  01/17/2014  Documentation initiated by:  Lanier ClamMAHABIR,KATHY  Subjective/Objective Assessment:   49 y/o m admitted w/Acute resp failure.Readmit 12/30-01/16/14.Hx:  sarcoidosis.     Action/Plan:   From home.   Anticipated DC Date:  01/23/2014   Anticipated DC Plan:  HOME/SELF CARE      DC Planning Services  CM consult      Choice offered to / List presented to:        [facility]        Status of service:  In process, will continue to follow Medicare Important Message given?   (If response is "NO", the following Medicare IM given date fields will be blank) Date Medicare IM given:   Medicare IM given by:   Date Additional Medicare IM given:   Additional Medicare IM given by:    Discharge Disposition:    Per UR Regulation:  Reviewed for med. necessity/level of care/duration of stay  If discussed at Long Length of Stay Meetings, dates discussed:    Comments:  01/22/14 12:50 CM spoke with pt concerning his home oxygen. Pt is active with [facility] for his home oxygen.  He has been on continuous oxygen for severral years and states he will make sure a tank is brought to his room by family for discharge.  Will continue to monitor for disposition. [name], BSN, [place] [phone].  01/21/14 Lanier ClamKathy Mahabir RN BSN NCM [phone] bipap prn, 02,nebs scheduled,iv abx.D/C plan home.  01/17/14 Lanier ClamKathy Mahabir RN BSN NCM [phone] Monitor progress for d/c needs.No anticipated d/c needs.

## 2014-01-17 NOTE — Progress Notes (Signed)
RT transported Vent to 1230- PT stated that he does not require BiPAP at this time- therefore RT transported Vent while RN transported PT. ICU RT aware of equipment and PT delivery.

## 2014-01-17 NOTE — ED Provider Notes (Signed)
CSN: [id number]     Arrival date & time 01/17/14  0745 History   First MD Initiated Contact with Patient 01/17/14 [phone]     Chief Complaint  Patient presents with  . Shortness of Breath     (Consider location/radiation/quality/duration/timing/severity/associated sxs/prior Treatment) HPI Comments: Patient continues on a steroid taper, also continues breathing treatments at home.  There cannot tell me how often he has been using them and cannot tell me when his last bronchodilator treatment was.  Patient is a 49 y.o. male presenting with shortness of breath. The history is provided by the patient and the spouse. The history is limited by the condition of the patient.  Shortness of Breath Severity:  Severe Onset quality:  Gradual Duration:  was feeling short of breath.  At time of discharge, though worse since last night. Timing:  Constant Progression:  Worsening Chronicity:  Chronic Relieved by:  Nothing Worsened by:  Nothing tried Ineffective treatments:  Oxygen and inhaler Associated symptoms: chest pain (chest tightness, abdominal tightness), cough, diaphoresis, fever and sputum production (brown or bright red tinged sputum)   Associated symptoms: no vomiting     Past Medical History  Diagnosis Date  . Hypertension   . Sarcoidosis   . OSA on CPAP   . Chronic respiratory failure     home oxygen PRN  . Bronchitis   . Pneumonia   . COPD (chronic obstructive pulmonary disease)   . Neuromuscular disorder    Past Surgical History  Procedure Laterality Date  . Rotator cuff repair    . Arm surgery     . Video assisted thoracoscopy (vats)/thorocotomy Left 07/17/2012    Procedure: VIDEO ASSISTED THORACOSCOPY (VATS)/BLEB Stapling;  Surgeon: Alleen BorneBryan K Bartle, MD;  Location: [facility] OR;  Service: Thoracic;  Laterality: Left;   Family History  Problem Relation Age of Onset  . Diabetes Father    History  Substance Use Topics  . Smoking status: Never Smoker   . Smokeless tobacco: Never  Used  . Alcohol Use: Yes     Comment: social    Review of Systems  Constitutional: Positive for fever, diaphoresis and fatigue.  Respiratory: Positive for cough, sputum production (brown or bright red tinged sputum) and shortness of breath.   Cardiovascular: Positive for chest pain (chest tightness, abdominal tightness).  Gastrointestinal: Negative for vomiting.      Allergies  Clindamycin/lincomycin  Home Medications   Prior to Admission medications   Medication Sig Start Date End Date Taking? Authorizing Provider  albuterol (PROAIR HFA) 108 (90 BASE) MCG/ACT inhaler Inhale 2 puffs into the lungs every 6 (six) hours as needed for wheezing or shortness of breath. 03/28/13  Yes Leslye Peerobert S Byrum, MD  albuterol (PROVENTIL) (2.5 MG/3ML) 0.083% nebulizer solution Take 2.5 mg by nebulization every 4 (four) hours as needed for wheezing or shortness of breath.  06/08/12  Yes Historical Provider, MD  bisacodyl (BISACODYL) 5 MG EC tablet Take 1 tablet (5 mg total) by mouth daily as needed for moderate constipation. 01/16/14  Yes Simonne MartinetPeter E Babcock, NP  budesonide-formoterol [facility](SYMBICORT) 160-4.5 MCG/ACT inhaler Inhale 2 puffs into the lungs 2 (two) times daily. 11/28/13  Yes Jeanella CrazeBrandi L Ollis, NP  calcium-vitamin D (OSCAL WITH D) 500-200 MG-UNIT per tablet Take 1 tablet by mouth every morning.   Yes Historical Provider, MD  cholecalciferol (VITAMIN D) 1000 UNITS tablet Take 1,000 Units by mouth every morning.    Yes Historical Provider, MD  cyclobenzaprine (FLEXERIL) 10 MG tablet Take 10 mg by mouth  3 (three) times daily as needed for muscle spasms.    Yes Historical Provider, MD  diltiazem (CARDIZEM) 30 MG tablet Take 1 tablet (30 mg total) by mouth every 8 (eight) hours. 11/28/13  Yes Jeanella Craze, NP  diphenhydrAMINE (BENADRYL) 25 MG tablet Take 25 mg by mouth every 6 (six) hours as needed for allergies.    Yes Historical Provider, MD  famotidine (PEPCID) 20 MG tablet Take 1 tablet (20 mg total) by mouth  2 (two) times daily. 03/08/12  Yes Renae Fickle, MD  ferrous sulfate 325 (65 FE) MG tablet Take 1 tablet (325 mg total) by mouth 2 (two) times daily with a meal. 06/18/12  Yes Leroy Sea, MD  furosemide (LASIX) 20 MG tablet Take 1 tablet (20 mg total) by mouth daily as needed for fluid or edema (Take if > 3lb weight gain in 24 hours or 5 lbs in 1 week,  or increased swelling in lower extremities). 11/28/13  Yes Jeanella Craze, NP  guaiFENesin (MUCINEX) 600 MG 12 hr tablet Take 1,200 mg by mouth 2 (two) times daily as needed (For congestion.).    Yes Historical Provider, MD  HYDROcodone-homatropine (HYCODAN) 5-1.5 MG/5ML syrup Take 5 mLs by mouth every 6 (six) hours as needed for cough. 12/11/13  Yes Leslye Peer, MD  lidocaine (LIDODERM) 5 % Place 1 patch onto the skin daily as needed (For pain.).    Yes Historical Provider, MD  metoprolol (LOPRESSOR) 50 MG tablet Take 1 tablet (50 mg total) by mouth 2 (two) times daily. 01/02/13  Yes Leslye Peer, MD  Multiple Vitamins-Minerals (MULTIVITAMIN WITH MINERALS) tablet Take 1 tablet by mouth every morning.    Yes Historical Provider, MD  polyethylene glycol (MIRALAX / GLYCOLAX) packet Take 17 g by mouth daily. 01/16/14  Yes Simonne Martinet, NP  predniSONE (DELTASONE) 10 MG tablet Take 4 tabs  daily with food x 4 days, then 3 tabs daily x 4 days, then 2 tabs daily x 4 days, then 1 tab daily x4 days then stop. 01/16/14  Yes Simonne Martinet, NP  pregabalin (LYRICA) 200 MG capsule Take 200 mg by mouth 2 (two) times daily as needed (For pain.).    Yes Historical Provider, MD  promethazine (PHENERGAN) 25 MG tablet Take 1 tablet (25 mg total) by mouth every 6 (six) hours as needed for Nausea. 11/02/11  Yes Historical Provider, MD  sodium chloride (OCEAN) 0.65 % SOLN nasal spray Place 1 spray into both nostrils as needed for congestion. 11/28/13  Yes Jeanella Craze, NP  traMADol (ULTRAM) 50 MG tablet Take 1 tablet (50 mg total) by mouth every 6 (six) hours as  needed. Patient taking differently: Take 50 mg by mouth every 6 (six) hours as needed for moderate pain.  09/05/13  Yes Heather Laisure, PA-C   BP 124/88 mmHg  Pulse 138  Temp(Src) 102.3 F (39.1 C) (Rectal)  Resp 31  SpO2 97% Physical Exam  Constitutional: He is oriented to person, place, and time. He appears well-developed and well-nourished. He appears distressed.  HENT:  Head: Normocephalic and atraumatic.  Mouth/Throat: No oropharyngeal exudate.  Eyes: Pupils are equal, round, and reactive to light.  Neck: Normal range of motion. Neck supple.  Cardiovascular: Regular rhythm and normal heart sounds.  Tachycardia present.  Exam reveals no gallop and no friction rub.   No murmur heard. Pulmonary/Chest: Accessory muscle usage present. Tachypnea noted. He is in respiratory distress. He has decreased breath sounds in the  right upper field, the right middle field, the right lower field, the left upper field, the left middle field and the left lower field. He has no wheezes. He has rales in the right upper field, the right middle field, the right lower field, the left upper field, the left middle field and the left lower field.  Abdominal: Soft. Bowel sounds are normal. He exhibits no distension and no mass. There is no tenderness. There is no rebound and no guarding.  Musculoskeletal: Normal range of motion. He exhibits edema (1+ bilateral lower extremity). He exhibits no tenderness.  Neurological: He is alert and oriented to person, place, and time.  Skin: Skin is warm. He is diaphoretic.  Psychiatric: He has a normal mood and affect.    ED Course  Procedures (including critical care time) Labs Review Labs Reviewed  CBC WITH DIFFERENTIAL - Abnormal; Notable for the following:    WBC 30.1 (*)    Hemoglobin 12.5 (*)    Neutrophils Relative % 90 (*)    Lymphocytes Relative 4 (*)    Neutro Abs 27.1 (*)    Monocytes Absolute 1.8 (*)    All other components within normal limits   COMPREHENSIVE METABOLIC PANEL - Abnormal; Notable for the following:    Chloride 92 (*)    CO2 33 (*)    Glucose, Bld 108 (*)    BUN 24 (*)    AST 72 (*)    ALT 57 (*)    GFR calc non Af Amer 74 (*)    GFR calc Af Amer 86 (*)    All other components within normal limits  BRAIN NATRIURETIC PEPTIDE - Abnormal; Notable for the following:    B Natriuretic Peptide 492.9 (*)    All other components within normal limits  BLOOD GAS, ARTERIAL - Abnormal; Notable for the following:    pH, Arterial 7.458 (*)    pO2, Arterial 60.7 (*)    Bicarbonate 30.9 (*)    Acid-Base Excess 6.6 (*)    All other components within normal limits  I-STAT CG4 LACTIC ACID, ED - Abnormal; Notable for the following:    Lactic Acid, Venous 2.73 (*)    All other components within normal limits  CULTURE, BLOOD (ROUTINE X 2)  CULTURE, RESPIRATORY (NON-EXPECTORATED)  CULTURE, BLOOD (ROUTINE X 2)  CULTURE, BLOOD (ROUTINE X 2)  PROCALCITONIN  I-STAT TROPOININ, ED  CBG MONITORING, ED    Imaging Review Dg Chest Port 1 View  01/17/2014   CLINICAL DATA:  Severe shortness of breath, cough and wheezing which began yesterday. Current history of COPD and sarcoidosis.  EXAM: PORTABLE CHEST - 1 VIEW  COMPARISON:  Portable chest x-rays 01/12/2014 dating back to 08/02/2012. Two-view chest x-ray 02/28/2013, 08/31/2012.  FINDINGS: Cardiomediastinal silhouette unremarkable, unchanged. Conglomerate scarring throughout both lungs as noted previously, consistent with end-stage sarcoidosis. New focal airspace consolidation medially at the right lung base. No new pulmonary parenchymal abnormalities elsewhere.  IMPRESSION: Acute pneumonia involving the right lung base, superimposed upon chronic end-stage sarcoidosis with conglomerate scarring throughout both lungs.   Electronically Signed   By: Hulan Saas M.D.   On: 01/17/2014 08:24   Dg Abd Portable 1v  01/15/2014   CLINICAL DATA:  Right upper quadrant abdominal pain and abdominal  bloating that began earlier today.  EXAM: PORTABLE ABDOMEN - 1 VIEW  COMPARISON:  CT pelvis 02/28/2012.  FINDINGS: Bowel gas pattern unremarkable without evidence of obstruction or significant ileus. Very large stool burden in the colon. Phleboliths low  in the right side of the pelvis. No visible opaque urinary tract calculi. Regional skeleton intact.  IMPRESSION: No acute abdominal abnormality.  Very large stool burden.   Electronically Signed   By: Hulan Saas M.D.   On: 01/15/2014 11:11     EKG Interpretation   Date/Time:  Thursday January 17 2014 07:51:24 EST Ventricular Rate:  149 PR Interval:  138 QRS Duration: 75 QT Interval:  266 QTC Calculation: 419 R Axis:   98 Text Interpretation:  Sinus tachycardia LAE, consider biatrial enlargement  Borderline right axis deviation Probable anteroseptal infarct, old  Confirmed by DOCHERTY  MD, MEGAN [phone]) on 01/17/2014 7:53:58 AM     CRITICAL CARE Performed by: Toy Cookey, E Total critical care time: 35 Critical care time was exclusive of separately billable procedures and treating other patients. Critical care was necessary to treat or prevent imminent or life-threatening deterioration. Critical care was time spent personally by me on the following activities: development of treatment plan with patient and/or surrogate as well as nursing, discussions with consultants, evaluation of patient's response to treatment, examination of patient, obtaining history from patient or surrogate, ordering and performing treatments and interventions, ordering and review of laboratory studies, ordering and review of radiographic studies, pulse oximetry and re-evaluation of patient's condition.   MDM   Final diagnoses:  Dyspnea  Acute respiratory failure with hypoxia  HCAP (healthcare-associated pneumonia)    Pt is a 49 y.o. male with Pmhx as above who presents with gradual onset worsening shortness of breath, wheezing, cough since being  discharged from the hospital yesterday afternoon for acute on chronic respiratory hypoxemic failure with acute asthmatic flare likely from RSV and bronchiectasis related to sarcoidosis.  On physical exam, patient is distressed.  Heart rate in the 140s, respiratory rate 34 with accessory muscle use and diaphoresis.  He reports having increased sputum output with streaks of bright red, sometimes dark brown.  He states he does not have a history of this prior.  He had been receiving Lovenox shots up until time of discharge from hospital yesterday.  He has wheezing and rales throughout the lung fields with decreased air movement.  He has 1+ pitting edema bilaterally.   CXR w/ new superimposed RLL pna, van/zosyn started for HCAP. I spoke w/ pt's Pulmonologist, Dr. Delton Coombes who was in hosp, and will see him in the ED. Blood cultures sent, tylenol will be given. BNP mildly elevated,   Toy Cookey, MD 01/17/14 1008

## 2014-01-17 NOTE — H&P (Signed)
Name: [name] MRN: [id number] DOB: 03-28-1965    ADMISSION DATE:  01/17/2014 CONSULTATION DATE:  1/7  REFERRING MD :  [name]  CHIEF COMPLAINT:  Acute respiratory failure   BRIEF PATIENT DESCRIPTION:  49 year old AAM well known to our svc & f/b RB for chronic respiratory failure in setting of Sarcoidosis (Dx'd by tbBx 02), just discharged on 1/6 after being treated for asthmatic bronchitis flare in setting of RSV infection. Returned to the hospital on 1/7 w/ < 24 hr new c/o productive cough brown blood tinged sputum, fever > 101, and progressive respiratory distress. CXR c/w new RLL infiltrate superimposed on chronic scarring. Admitted to SDU w/ working dx of HCAP.   SIGNIFICANT EVENTS  1/6 just d/c after asthmatic-like flare in setting of RSV virus.  1/7 re-admitted w/ HCAP  STUDIES:     HISTORY OF PRESENT ILLNESS:  See above.   PAST MEDICAL HISTORY :   has a past medical history of Hypertension; Sarcoidosis; OSA on CPAP; Chronic respiratory failure; Bronchitis; Pneumonia; COPD (chronic obstructive pulmonary disease); and Neuromuscular disorder.  has past surgical history that includes Rotator cuff repair; arm surgery ; and Video assisted thoracoscopy (vats)/thorocotomy (Left, 07/17/2012). Prior to Admission medications   Medication Sig Start Date End Date Taking? Authorizing Provider  albuterol (PROAIR HFA) 108 (90 BASE) MCG/ACT inhaler Inhale 2 puffs into the lungs every 6 (six) hours as needed for wheezing or shortness of breath. 03/28/13   Leslye Peer, MD  albuterol (PROVENTIL) (2.5 MG/3ML) 0.083% nebulizer solution Take 2.5 mg by nebulization every 4 (four) hours as needed for wheezing or shortness of breath.  06/08/12   Historical Provider, MD  bisacodyl (BISACODYL) 5 MG EC tablet Take 1 tablet (5 mg total) by mouth daily as needed for moderate constipation. 01/16/14   Simonne Martinet, NP  budesonide-formoterol [facility]) 160-4.5 MCG/ACT inhaler Inhale 2 puffs into the lungs 2  (two) times daily. 11/28/13   Jeanella Craze, NP  calcium-vitamin D (OSCAL WITH D) 500-200 MG-UNIT per tablet Take 1 tablet by mouth every morning.    Historical Provider, MD  cholecalciferol (VITAMIN D) 1000 UNITS tablet Take 1,000 Units by mouth every morning.     Historical Provider, MD  cyclobenzaprine (FLEXERIL) 10 MG tablet Take 10 mg by mouth 3 (three) times daily as needed for muscle spasms.     Historical Provider, MD  diltiazem (CARDIZEM) 30 MG tablet Take 1 tablet (30 mg total) by mouth every 8 (eight) hours. 11/28/13   Jeanella Craze, NP  diphenhydrAMINE (BENADRYL) 25 MG tablet Take 25 mg by mouth every 6 (six) hours as needed for allergies.     Historical Provider, MD  famotidine (PEPCID) 20 MG tablet Take 1 tablet (20 mg total) by mouth 2 (two) times daily. 03/08/12   Renae Fickle, MD  ferrous sulfate 325 (65 FE) MG tablet Take 1 tablet (325 mg total) by mouth 2 (two) times daily with a meal. 06/18/12   Leroy Sea, MD  furosemide (LASIX) 20 MG tablet Take 1 tablet (20 mg total) by mouth daily as needed for fluid or edema (Take if > 3lb weight gain in 24 hours or 5 lbs in 1 week,  or increased swelling in lower extremities). 11/28/13   Jeanella Craze, NP  guaiFENesin (MUCINEX) 600 MG 12 hr tablet Take 1,200 mg by mouth 2 (two) times daily as needed (For congestion.).     Historical Provider, MD  HYDROcodone-homatropine [facility]) 5-1.5 MG/5ML syrup Take  5 mLs by mouth every 6 (six) hours as needed for cough. 12/11/13   Leslye Peer, MD  lidocaine (LIDODERM) 5 % Place 1 patch onto the skin daily as needed (For pain.).     Historical Provider, MD  metoprolol (LOPRESSOR) 50 MG tablet Take 1 tablet (50 mg total) by mouth 2 (two) times daily. 01/02/13   Leslye Peer, MD  Multiple Vitamins-Minerals (MULTIVITAMIN WITH MINERALS) tablet Take 1 tablet by mouth every morning.     Historical Provider, MD  polyethylene glycol (MIRALAX / GLYCOLAX) packet Take 17 g by mouth daily. 01/16/14   Simonne Martinet, NP  predniSONE (DELTASONE) 10 MG tablet Take 4 tabs  daily with food x 4 days, then 3 tabs daily x 4 days, then 2 tabs daily x 4 days, then 1 tab daily x4 days then stop. 01/16/14   Simonne Martinet, NP  pregabalin (LYRICA) 200 MG capsule Take 200 mg by mouth 2 (two) times daily as needed (For pain.).     Historical Provider, MD  promethazine (PHENERGAN) 25 MG tablet Take 1 tablet (25 mg total) by mouth every 6 (six) hours as needed for Nausea. 11/02/11   Historical Provider, MD  sodium chloride (OCEAN) 0.65 % SOLN nasal spray Place 1 spray into both nostrils as needed for congestion. 11/28/13   Jeanella Craze, NP  traMADol (ULTRAM) 50 MG tablet Take 1 tablet (50 mg total) by mouth every 6 (six) hours as needed. Patient taking differently: Take 50 mg by mouth every 6 (six) hours as needed for moderate pain.  09/05/13   Santiago Glad, PA-C   Allergies  Allergen Reactions  . Clindamycin/Lincomycin Anaphylaxis and Swelling    Made face and tongue swell    FAMILY HISTORY:  family history includes Diabetes in his father. SOCIAL HISTORY:  reports that he has never smoked. He has never used smokeless tobacco. He reports that he drinks alcohol. He reports that he does not use illicit drugs.  REVIEW OF SYSTEMS:   Constitutional: new fever > 101, chills, weight loss, malaise/fatigue and + diaphoresis.  HENT: Negative for hearing loss, ear pain, nosebleeds, congestion, sore throat, neck pain, tinnitus and ear discharge.   Eyes: Negative for blurred vision, double vision, photophobia, pain, discharge and redness.  Respiratory:cough productive brown blood tinged,  shortness of breath, wheezing and stridor.   Cardiovascular: Negative for chest pain, palpitations, orthopnea, claudication, leg swelling and PND.  Gastrointestinal: Negative for heartburn, nausea, vomiting, abdominal pain, diarrhea, constipation, blood in stool and melena.  Genitourinary: Negative for dysuria, urgency, frequency,  hematuria and flank pain.  Musculoskeletal: Negative for myalgias, back pain, joint pain and falls.  Skin: Negative for itching and rash.  Neurological: Negative for dizziness, tingling, tremors, sensory change, speech change, focal weakness, seizures, loss of consciousness, weakness and headaches.  Endo/Heme/Allergies: Negative for environmental allergies and polydipsia. Does not bruise/bleed easily.  SUBJECTIVE:  Feels sick.  VITAL SIGNS: Temp:  [102.3 F (39.1 C)] 102.3 F (39.1 C) (01/07 0854) Pulse Rate:  [147-149] 147 (01/07 0810) Resp:  [31-34] 31 (01/07 0810) BP: (109-123)/(69-75) 109/69 mmHg (01/07 0757) SpO2:  [96 %-100 %] 99 % (01/07 0819)  PHYSICAL EXAMINATION: General:  Acutely ill appearing. Sitting up right w/ sig accessory muscle use and only able to complete short phrases.  Neuro:  Awake, oriented and w/out focal def  HEENT:  [place] no JVD  Cardiovascular:  Tachy rrr  Lungs:  Diffuse rhonchi and wheeze  Abdomen:  Firm, + bowel  sounds  Musculoskeletal:  Intact  Skin:  Trace LE edema    Recent Labs Lab 01/12/14 0354 01/14/14 0413 01/15/14 0410  NA 136 139 136  K 3.8 4.5 4.0  CL 96 98 95*  CO2 34* 31 32  BUN 29* 23 28*  CREATININE 0.93 0.87 0.93  GLUCOSE 170* 147* 171*    Recent Labs Lab 01/11/14 0402 01/17/14 0805  HGB 11.5* 12.5*  HCT 36.1* 41.0  WBC 14.0* 30.1*  PLT 264 288   Dg Chest Port 1 View  01/17/2014   CLINICAL DATA:  Severe shortness of breath, cough and wheezing which began yesterday. Current history of COPD and sarcoidosis.  EXAM: PORTABLE CHEST - 1 VIEW  COMPARISON:  Portable chest x-rays 01/12/2014 dating back to 08/02/2012. Two-view chest x-ray 02/28/2013, 08/31/2012.  FINDINGS: Cardiomediastinal silhouette unremarkable, unchanged. Conglomerate scarring throughout both lungs as noted previously, consistent with end-stage sarcoidosis. New focal airspace consolidation medially at the right lung base. No new pulmonary parenchymal  abnormalities elsewhere.  IMPRESSION: Acute pneumonia involving the right lung base, superimposed upon chronic end-stage sarcoidosis with conglomerate scarring throughout both lungs.   Electronically Signed   By: Hulan Saashomas  Lawrence M.D.   On: 01/17/2014 08:24   Dg Abd Portable 1v  01/15/2014   CLINICAL DATA:  Right upper quadrant abdominal pain and abdominal bloating that began earlier today.  EXAM: PORTABLE ABDOMEN - 1 VIEW  COMPARISON:  CT pelvis 02/28/2012.  FINDINGS: Bowel gas pattern unremarkable without evidence of obstruction or significant ileus. Very large stool burden in the colon. Phleboliths low in the right side of the pelvis. No visible opaque urinary tract calculi. Regional skeleton intact.  IMPRESSION: No acute abdominal abnormality.  Very large stool burden.   Electronically Signed   By: Hulan Saashomas  Lawrence M.D.   On: 01/15/2014 11:11    ASSESSMENT / PLAN:  Acute on chronic respiratory hypoxemic failure - in setting of HCAP Sarcoidosis just d/c'd 1/6 s/p acute asthmatic like flare from RSV  Bronchiectasis related to sarcoid OSA - on CPAP   Plan:  Admit to SDU setting BIPAP PRN PCT algorithm  Sputum culture  HCAP coverage Scheduled BDs (will avoid atrovent given GI issues recently) Resume IV solumedrol  O2 as needed to maintain O2 saturation > 90% Repeat CXR in am High risk for intubation   SIRS/sepsis d/t HCAP  ST: multifactorial in setting of SIRS and Continuous neb  Currently no evidence of end-organ dysfxn. Lactic acid ok.  Plan:  Hold CCb and BB for now D/c CAB Will go ahead w/ 930ml/kg fluid challenge  Empiric abx as mentioned above  Repeat lactic acid after fluid challenge   Abdominal discomfort> due to constipation  Plan: Avoid atrovent Bowel regimen escalated  Careful w/ narcs  Simonne MartinetPeter E Babcock ACNP-BC [facility] Pulmonary/Critical Care Pager # [phone] OR # [phone] if no answer 01/17/2014, 9:04 AM  Attending Note:  I have examined patient, reviewed  labs, studies and notes. I have discussed the case with Kreg ShropshireP Babcock, and I agree with the data and plans as amended above.  Levy Pupaobert Ruba Outen, MD, PhD 01/18/2014, 9:02 AM [place] [facility] [phone] or if no answer [phone]

## 2014-01-18 ENCOUNTER — Inpatient Hospital Stay (HOSPITAL_COMMUNITY): Payer: 59

## 2014-01-18 LAB — CBC
HCT: 35.1 % — ABNORMAL LOW (ref 39.0–52.0)
HEMOGLOBIN: 10.9 g/dL — AB (ref 13.0–17.0)
MCH: 26.8 pg (ref 26.0–34.0)
MCHC: 31.1 g/dL (ref 30.0–36.0)
MCV: 86.2 fL (ref 78.0–100.0)
Platelets: 215 10*3/uL (ref 150–400)
RBC: 4.07 MIL/uL — AB (ref 4.22–5.81)
RDW: 14.1 % (ref 11.5–15.5)
WBC: 29.9 10*3/uL — AB (ref 4.0–10.5)

## 2014-01-18 LAB — GLUCOSE, CAPILLARY
Glucose-Capillary: 155 mg/dL — ABNORMAL HIGH (ref 70–99)
Glucose-Capillary: 286 mg/dL — ABNORMAL HIGH (ref 70–99)
Glucose-Capillary: 135 mg/dL — ABNORMAL HIGH (ref 70–99)

## 2014-01-18 LAB — PHOSPHORUS: Phosphorus: 3.2 mg/dL (ref 2.3–4.6)

## 2014-01-18 LAB — EXPECTORATED SPUTUM ASSESSMENT W GRAM STAIN, RFLX TO RESP C

## 2014-01-18 LAB — BASIC METABOLIC PANEL
ANION GAP: 8 (ref 5–15)
BUN: 15 mg/dL (ref 6–23)
CO2: 32 mmol/L (ref 19–32)
Calcium: 8.7 mg/dL (ref 8.4–10.5)
Chloride: 98 mEq/L (ref 96–112)
Creatinine, Ser: 0.96 mg/dL (ref 0.50–1.35)
GFR calc Af Amer: >90 mL/min (ref >90)
GFR calc non Af Amer: >90 mL/min (ref >90)
Glucose, Bld: 174 mg/dL — ABNORMAL HIGH (ref 70–99)
Potassium: 4.9 mmol/L (ref 3.5–5.1)
SODIUM: 138 mmol/L (ref 135–145)

## 2014-01-18 LAB — PROCALCITONIN: Procalcitonin: 2.22 ng/mL

## 2014-01-18 LAB — MAGNESIUM: Magnesium: 2.6 mg/dL — ABNORMAL HIGH (ref 1.5–2.5)

## 2014-01-18 LAB — LACTIC ACID, PLASMA: LACTIC ACID, VENOUS: 4 mmol/L — AB (ref 0.5–2.2)

## 2014-01-18 MED ORDER — METHYLPREDNISOLONE SODIUM SUCC 125 MG IJ SOLR
60.0000 mg | Freq: Two times a day (BID) | INTRAMUSCULAR | Status: DC
  Administered 2014-01-18 – 2014-01-20 (×4): 60 mg via INTRAVENOUS
  Filled 2014-01-18 (×4): qty 2

## 2014-01-18 MED ORDER — DILTIAZEM HCL 30 MG PO TABS
30.0000 mg | ORAL_TABLET | Freq: Three times a day (TID) | ORAL | Status: DC
  Administered 2014-01-18 – 2014-01-27 (×28): 30 mg via ORAL
  Filled 2014-01-18 (×37): qty 1

## 2014-01-18 MED ORDER — ALUM & MAG HYDROXIDE-SIMETH 200-200-25 MG PO CHEW
1.0000 | CHEWABLE_TABLET | Freq: Four times a day (QID) | ORAL | Status: DC | PRN
  Filled 2014-01-18: qty 1

## 2014-01-18 MED ORDER — MENTHOL 3 MG MT LOZG
1.0000 | LOZENGE | OROMUCOSAL | Status: DC | PRN
  Filled 2014-01-18: qty 9

## 2014-01-18 MED ORDER — METOPROLOL TARTRATE 50 MG PO TABS
50.0000 mg | ORAL_TABLET | Freq: Two times a day (BID) | ORAL | Status: DC
  Administered 2014-01-18 – 2014-01-28 (×21): 50 mg via ORAL
  Filled 2014-01-18 (×3): qty 1
  Filled 2014-01-18: qty 2
  Filled 2014-01-18 (×3): qty 1
  Filled 2014-01-18: qty 2
  Filled 2014-01-18 (×10): qty 1
  Filled 2014-01-18: qty 2
  Filled 2014-01-18 (×2): qty 1
  Filled 2014-01-18 (×2): qty 2
  Filled 2014-01-18 (×2): qty 1

## 2014-01-18 MED ORDER — POLYETHYLENE GLYCOL 3350 17 G PO PACK
17.0000 g | PACK | Freq: Every day | ORAL | Status: DC
  Administered 2014-01-19 – 2014-01-28 (×9): 17 g via ORAL
  Filled 2014-01-18 (×9): qty 1

## 2014-01-18 MED ORDER — ALUM & MAG HYDROXIDE-SIMETH 200-200-20 MG/5ML PO SUSP
15.0000 mL | Freq: Four times a day (QID) | ORAL | Status: DC | PRN
  Administered 2014-01-18 – 2014-01-24 (×10): 15 mL via ORAL
  Filled 2014-01-18 (×10): qty 30

## 2014-01-18 NOTE — Progress Notes (Signed)
Name: [name] MRN: [id number] DOB: 02/12/1965    ADMISSION DATE:  01/17/2014 CONSULTATION DATE:  1/7  REFERRING MD :  [name]  CHIEF COMPLAINT:  Acute respiratory failure   BRIEF PATIENT DESCRIPTION:  49 year old AAM well known to our svc & f/b RB for chronic respiratory failure in setting of Sarcoidosis (Dx'd by tbBx 02), just discharged on 1/6 after being treated for asthmatic bronchitis flare in setting of RSV infection. Returned to the hospital on 1/7 w/ < 24 hr new c/o productive cough brown blood tinged sputum, fever > 101, and progressive respiratory distress. CXR c/w new RLL infiltrate superimposed on chronic scarring. Admitted to SDU w/ working dx of HCAP.   SIGNIFICANT EVENTS  1/6 just d/c after asthmatic-like flare in setting of RSV virus.  1/7 re-admitted w/ HCAP  STUDIES:     SUBJECTIVE:  Feels better  VITAL SIGNS: Temp:  [96.9 F (36.1 C)-102.3 F (39.1 C)] 97.7 F (36.5 C) (01/08 0454) Pulse Rate:  [25-138] 106 (01/08 0620) Resp:  [15-33] 15 (01/08 0620) BP: (105-166)/(66-121) 136/105 mmHg (01/08 0620) SpO2:  [74 %-100 %] 100 % (01/08 0620) FiO2 (%):  [30 %-40 %] 40 % (01/07 1629) Weight:  [86.9 kg (191 lb 9.3 oz)] 86.9 kg (191 lb 9.3 oz) (01/08 0454) [place] PHYSICAL EXAMINATION: General:  Remarkably better c/w exam on admit Neuro:  Awake, oriented and w/out focal def  HEENT:   no JVD  Cardiovascular:  Tachy rrr  Lungs:  Diffuse rhonchi, but wheeze improved  Abdomen:  Firm, + bowel sounds  Musculoskeletal:  Intact  Skin:  Trace LE edema    Recent Labs Lab 01/15/14 0410 01/17/14 0805 01/18/14 0552  NA 136 136 138  K 4.0 4.5 4.9  CL 95* 92* 98  CO2 32 33* 32  BUN 28* 24* 15  CREATININE 0.93 1.14 0.96  GLUCOSE 171* 108* 174*    Recent Labs Lab 01/17/14 0805 01/18/14 0552  HGB 12.5* 10.9*  HCT 41.0 35.1*  WBC 30.1* 29.9*  PLT 288 215    Recent Labs Lab 01/17/14 0805 01/17/14 0819 01/18/14 0202 01/18/14 0552  PROCALCITON 2.11  --    --  2.22  WBC 30.1*  --   --  29.9*  LATICACIDVEN  --  2.73* 4.0*  --       Dg Chest Port 1 View  01/18/2014   CLINICAL DATA:  Pneumonia  EXAM: PORTABLE CHEST - 1 VIEW  COMPARISON:  01/17/2014  FINDINGS: Cardiac shadow is stable chronic bilateral scarring is again identified worst in the left lung base and right mid lung. New persistent right basilar infiltrate is noted medially. This is stable from the prior exam. No new focal abnormality is seen.  IMPRESSION: Changes of acute right basilar infiltrate superimposed over chronic changes of sarcoidosis. No significant change from the prior exam is noted.   Electronically Signed   By: Alcide CleverMark  Lukens M.D.   On: 01/18/2014 07:16   Dg Chest Port 1 View  01/17/2014   CLINICAL DATA:  Severe shortness of breath, cough and wheezing which began yesterday. Current history of COPD and sarcoidosis.  EXAM: PORTABLE CHEST - 1 VIEW  COMPARISON:  Portable chest x-rays 01/12/2014 dating back to 08/02/2012. Two-view chest x-ray 02/28/2013, 08/31/2012.  FINDINGS: Cardiomediastinal silhouette unremarkable, unchanged. Conglomerate scarring throughout both lungs as noted previously, consistent with end-stage sarcoidosis. New focal airspace consolidation medially at the right lung base. No new pulmonary parenchymal abnormalities elsewhere.  IMPRESSION: Acute pneumonia involving the  right lung base, superimposed upon chronic end-stage sarcoidosis with conglomerate scarring throughout both lungs.   Electronically Signed   By: Hulan Saas M.D.   On: 01/17/2014 08:24  CXR unchanged c/w admit   ASSESSMENT / PLAN:  Acute on chronic respiratory hypoxemic failure - in setting of HCAP Sarcoidosis just d/c'd 1/6 s/p acute asthmatic like flare from RSV  Bronchiectasis related to sarcoid OSA - on CPAP  > looks and feels better. CXR stable  Plan:  BIPAP PRN PCT algorithm  F/u Sputum culture  HCAP coverage: vanc and zosyn 1/7>>> Scheduled BDs (avoid atrovent given GI issues  recently) Taper steroids  O2 as needed to maintain O2 saturation > 90% Repeat CXR in am   SIRS/sepsis d/t HCAP  ST: multifactorial in setting of SIRS and Continuous neb  Currently no evidence of end-organ dysfxn. Feels and looks better  Plan:  Resume BB and CCB Cont Empiric abx as mentioned above  Cont MIVF  Abdominal discomfort> due to constipation  Plan: Avoid atrovent Bowel regimen escalated  Careful w/ narcs   Looks better. If no events towards this afternoon we can move him to regular ward. Will need to stay over the weekend.    Simonne Martinet ACNP-BC [facility] Pulmonary/Critical Care Pager # [phone] OR # [phone] if no answer 01/18/2014, 8:34 AM  Attending Note:  I have examined patient, reviewed labs, studies and notes. I have discussed the case with Kreg Shropshire, and I agree with the data and plans as amended above.   Levy Pupa, MD, PhD 01/18/2014, 9:06 AM [place] [facility] [phone] or if no answer [phone]

## 2014-01-19 LAB — GLUCOSE, CAPILLARY
GLUCOSE-CAPILLARY: 133 mg/dL — AB (ref 70–99)
GLUCOSE-CAPILLARY: 222 mg/dL — AB (ref 70–99)
Glucose-Capillary: 150 mg/dL — ABNORMAL HIGH (ref 70–99)
Glucose-Capillary: 168 mg/dL — ABNORMAL HIGH (ref 70–99)

## 2014-01-19 LAB — BASIC METABOLIC PANEL
Anion gap: 6 (ref 5–15)
BUN: 22 mg/dL (ref 6–23)
CO2: 31 mmol/L (ref 19–32)
Calcium: 8.8 mg/dL (ref 8.4–10.5)
Chloride: 99 mEq/L (ref 96–112)
Creatinine, Ser: 0.88 mg/dL (ref 0.50–1.35)
GFR calc Af Amer: >90 mL/min (ref >90)
GFR calc non Af Amer: >90 mL/min (ref >90)
Glucose, Bld: 163 mg/dL — ABNORMAL HIGH (ref 70–99)
Potassium: 4.4 mmol/L (ref 3.5–5.1)
Sodium: 136 mmol/L (ref 135–145)

## 2014-01-19 LAB — VANCOMYCIN, TROUGH: Vancomycin Tr: 18.9 ug/mL (ref 10.0–20.0)

## 2014-01-19 LAB — PROCALCITONIN: Procalcitonin: 1.68 ng/mL

## 2014-01-19 MED ORDER — FLUTICASONE PROPIONATE 50 MCG/ACT NA SUSP
2.0000 | Freq: Two times a day (BID) | NASAL | Status: DC
Start: 2014-01-19 — End: 2014-01-28
  Administered 2014-01-19 – 2014-01-28 (×16): 2 via NASAL
  Filled 2014-01-19 (×2): qty 16

## 2014-01-19 MED ORDER — LORATADINE 10 MG PO TABS
10.0000 mg | ORAL_TABLET | Freq: Every day | ORAL | Status: DC
  Administered 2014-01-19 – 2014-01-24 (×6): 10 mg via ORAL
  Filled 2014-01-19 (×6): qty 1

## 2014-01-19 MED ORDER — SALINE SPRAY 0.65 % NA SOLN
1.0000 | NASAL | Status: DC | PRN
  Administered 2014-01-19: 1 via NASAL
  Filled 2014-01-19: qty 44

## 2014-01-19 NOTE — Progress Notes (Signed)
Name: [name] MRN: [id number] DOB: February 26, 1965    ADMISSION DATE:  01/17/2014 CONSULTATION DATE:  1/7  REFERRING MD :  [name]  CHIEF COMPLAINT:  Acute respiratory failure   BRIEF PATIENT DESCRIPTION:  49 year old AAM well known to our svc & f/b RB for chronic respiratory failure in setting of Sarcoidosis (Dx'd by tbBx 02), just discharged on 1/6 after being treated for asthmatic bronchitis flare in setting of RSV infection. Returned to the hospital on 1/7 w/ < 24 hr new c/o productive cough brown blood tinged sputum, fever > 101, and progressive respiratory distress. CXR c/w new RLL infiltrate superimposed on chronic scarring. Admitted to SDU w/ working dx of HCAP.   SIGNIFICANT EVENTS  1/6 just d/c after asthmatic-like flare in setting of RSV virus.  1/7 re-admitted w/ HCAP  STUDIES:     SUBJECTIVE:  Feels better  VITAL SIGNS: Temp:  [97 F (36.1 C)-98.8 F (37.1 C)] 97.7 F (36.5 C) (01/09 0800) Pulse Rate:  [84-118] 95 (01/09 0800) Resp:  [15-33] 16 (01/09 0800) BP: (150-159)/(45-110) 150/110 mmHg (01/09 0800) SpO2:  [64 %-100 %] 100 % (01/09 0800) [place] PHYSICAL EXAMINATION: General:  Remarkably better c/w exam on admit Neuro:  Awake, oriented and w/out focal def  HEENT:  [place] no JVD  Cardiovascular:  Tachy rrr  Lungs:  Diffuse rhonchi, but wheeze improved  Abdomen:  Firm, + bowel sounds  Musculoskeletal:  Intact  Skin:  Trace LE edema    Recent Labs Lab 01/15/14 0410 01/17/14 0805 01/18/14 0552  NA 136 136 138  K 4.0 4.5 4.9  CL 95* 92* 98  CO2 32 33* 32  BUN 28* 24* 15  CREATININE 0.93 1.14 0.96  GLUCOSE 171* 108* 174*    Recent Labs Lab 01/17/14 0805 01/18/14 0552  HGB 12.5* 10.9*  HCT 41.0 35.1*  WBC 30.1* 29.9*  PLT 288 215    Recent Labs Lab 01/17/14 0805 01/17/14 0819 01/18/14 0202 01/18/14 0552 01/19/14 0330  PROCALCITON 2.11  --   --  2.22 1.68  WBC 30.1*  --   --  29.9*  --   LATICACIDVEN  --  2.73* 4.0*  --   --       Dg  Chest Port 1 View  01/18/2014   CLINICAL DATA:  Pneumonia  EXAM: PORTABLE CHEST - 1 VIEW  COMPARISON:  01/17/2014  FINDINGS: Cardiac shadow is stable chronic bilateral scarring is again identified worst in the left lung base and right mid lung. New persistent right basilar infiltrate is noted medially. This is stable from the prior exam. No new focal abnormality is seen.  IMPRESSION: Changes of acute right basilar infiltrate superimposed over chronic changes of sarcoidosis. No significant change from the prior exam is noted.   Electronically Signed   By: Alcide Clever M.D.   On: 01/18/2014 07:16  CXR unchanged c/w admit   ASSESSMENT / PLAN:  Acute on chronic respiratory hypoxemic failure - in setting of HCAP Sarcoidosis just d/c'd 1/6 s/p acute asthmatic like flare from RSV  Bronchiectasis related to sarcoid OSA - on CPAP  CXR stable  Plan:  BIPAP PRN F/u Sputum culture  HCAP coverage: vanc and zosyn 1/7>>> Scheduled BDs (avoid atrovent given GI issues recently) Taper steroids over next several days O2 as needed to maintain O2 saturation > 90% Repeat CXR 1/10  SIRS/sepsis d/t HCAP  ST: multifactorial in setting of SIRS and Continuous neb  Currently no evidence of end-organ dysfxn. Feels and looks  better  Plan:  Continue BB and CCB Cont Empiric abx as mentioned above  Cont MIVF  Abdominal discomfort> due to constipation and some bloating Plan: Avoid atrovent Bowel regimen escalated  Careful w/ narcs add   Looks better. Will move him to regular ward. Will need to stay over the weekend.     Levy Pupaobert Azariah Bonura, MD, PhD 01/19/2014, 9:03 AM [place] [facility] [phone] or if no answer [phone]

## 2014-01-19 NOTE — Progress Notes (Signed)
Patient already on CPAP. Settings checked and sterile water added to water chamber. 3 liter oxygen bleed in. Patient is tolerating well at this time.

## 2014-01-19 NOTE — Progress Notes (Signed)
ANTIBIOTIC CONSULT NOTE - FOLLOW UP  Pharmacy Consult for Vancomycin, Zosyn Indication: HCAP, r/o sepsis  Allergies  Allergen Reactions  . Clindamycin/Lincomycin Anaphylaxis and Swelling    Made face and tongue swell    Patient Measurements: Height: 5\' 7"  (170.2 cm) Weight: 191 lb 9.3 oz (86.9 kg) IBW/kg (Calculated) : 66.1  Vital Signs: Temp: 97.7 F (36.5 C) (01/09 0800) Temp Source: Oral (01/09 0800) BP: 150/110 mmHg (01/09 0800) Pulse Rate: 95 (01/09 0800) Intake/Output from previous day: 01/08 0701 - 01/09 0700 In: 2865 [P.O.:240; I.V.:1925; IV Piggyback:700] Out: 701 [Urine:701] Intake/Output from this shift: Total I/O In: 195 [P.O.:120; I.V.:75] Out: 375 [Urine:375]  Labs:  Recent Labs  01/17/14 0805 01/18/14 0552 01/19/14 0931  WBC 30.1* 29.9*  --   HGB 12.5* 10.9*  --   PLT 288 215  --   CREATININE 1.14 0.96 0.88   Estimated Creatinine Clearance: 98 mL/min (by C-G formula based on Cr of 0.96).  Recent Labs  01/19/14 0931  VANCOTROUGH 18.9    Assessment: 48yo M presenting 1/7 w/ worsening SOB, wheezing, and cough in setting of chronic respiratory failure with hx of Sarcoidosis. Recently admitted 12/30-1/6 for acute asthmatic flare, +RSV. Pharmacy is asked to dose Vanc and Zosyn for HCAP/sepsis.  1/7 >> Zosyn >> 1/7 >> Vanc >>   12/30 blood x 2: NGF 12/30 urine: NGF 12/30 sputum: normal oral flora 12/30 respiratory virus panel: +RSV A 1/7 blood x2: ngtd 1/8 sputum: IP  Today, 01/19/2014:   Temp: currently afebrile  WBCs: 30.1 > slightly improved, 29.9 (1/8)  Renal: SCr 0.88, CrCl > 100 ml/min CG/N  Lactic acid: 2.73 > 4 (1/8)  PCT: 2.22 > 1.68  Vancomycin trough level = 18.9, therapeutic on 1g IV q8h   Goal of Therapy:  Vancomycin trough level 15-20 mcg/ml  Appropriate abx dosing, eradication of infection.   Plan:   Continue Zosyn 3.375g IV Q8H infused over 4hrs.   Continue Vancomycin 1g IV q8h.   Recheck Vanc trough  as needed.  Follow up renal fxn and culture results.   Lynann Beaverhristine Siniya Lichty PharmD, BCPS Pager [phone] 01/19/2014 9:46 AM

## 2014-01-20 ENCOUNTER — Inpatient Hospital Stay (HOSPITAL_COMMUNITY): Payer: 59

## 2014-01-20 LAB — CULTURE, RESPIRATORY: CULTURE: NORMAL

## 2014-01-20 LAB — BLOOD GAS, ARTERIAL
Acid-Base Excess: 5.3 mmol/L — ABNORMAL HIGH (ref 0.0–2.0)
Bicarbonate: 30.4 mEq/L — ABNORMAL HIGH (ref 20.0–24.0)
DELIVERY SYSTEMS: POSITIVE
DRAWN BY: 31814
Expiratory PAP: 10
O2 Content: 3 L/min
O2 SAT: 98.5 %
PO2 ART: 122 mmHg — AB (ref 80.0–100.0)
Patient temperature: 97.8
TCO2: 27.7 mmol/L (ref 0–100)
pCO2 arterial: 48 mmHg — ABNORMAL HIGH (ref 35.0–45.0)
pH, Arterial: 7.415 (ref 7.350–7.450)

## 2014-01-20 LAB — GLUCOSE, CAPILLARY
GLUCOSE-CAPILLARY: 176 mg/dL — AB (ref 70–99)
GLUCOSE-CAPILLARY: 142 mg/dL — AB (ref 70–99)
GLUCOSE-CAPILLARY: 101 mg/dL — AB (ref 70–99)
Glucose-Capillary: 149 mg/dL — ABNORMAL HIGH (ref 70–99)

## 2014-01-20 LAB — CULTURE, RESPIRATORY W GRAM STAIN

## 2014-01-20 MED ORDER — FERROUS SULFATE 325 (65 FE) MG PO TABS
325.0000 mg | ORAL_TABLET | Freq: Every day | ORAL | Status: DC
  Administered 2014-01-21 – 2014-01-28 (×8): 325 mg via ORAL
  Filled 2014-01-20 (×9): qty 1

## 2014-01-20 MED ORDER — PREDNISONE 50 MG PO TABS
60.0000 mg | ORAL_TABLET | Freq: Every day | ORAL | Status: DC
  Administered 2014-01-21 – 2014-01-22 (×2): 60 mg via ORAL
  Filled 2014-01-20 (×3): qty 1

## 2014-01-20 NOTE — Progress Notes (Signed)
Patient places himself on and off CPAP as he feels like he needs it. Sterile water added to chamber for humidification. Patient tolerating well at this time. RT will continue to monitor and assess as needed.

## 2014-01-20 NOTE — Progress Notes (Signed)
Name: [name] MRN: [id number] DOB: 03/04/1965    ADMISSION DATE:  01/17/2014 CONSULTATION DATE:  1/7  REFERRING MD :  [name]  CHIEF COMPLAINT:  Acute respiratory failure   BRIEF PATIENT DESCRIPTION:  49 year old AAM well known to our svc & f/b RB for chronic respiratory failure in setting of Sarcoidosis (Dx'd by tbBx 02), just discharged on 1/6 after being treated for asthmatic bronchitis flare in setting of RSV infection. Returned to the hospital on 1/7 w/ < 24 hr new c/o productive cough brown blood tinged sputum, fever > 101, and progressive respiratory distress. CXR c/w new RLL infiltrate superimposed on chronic scarring. Admitted to SDU w/ working dx of HCAP.   SIGNIFICANT EVENTS  1/6 just d/c after asthmatic-like flare in setting of RSV virus.  1/7 re-admitted w/ HCAP  STUDIES:     SUBJECTIVE:  Feels better  VITAL SIGNS: Temp:  [96.8 F (36 C)-98.9 F (37.2 C)] 98.9 F (37.2 C) (01/10 0800) Pulse Rate:  [25-100] 26 (01/10 0800) Resp:  [11-29] 27 (01/10 0800) BP: (154-198)/(104-134) 160/110 mmHg (01/10 0800) SpO2:  [85 %-100 %] 99 % (01/10 0800) Weight:  [89.6 kg (197 lb 8.5 oz)] 89.6 kg (197 lb 8.5 oz) (01/10 0500) [place] PHYSICAL EXAMINATION: General:  Remarkably better c/w exam on admit Neuro:  Awake, oriented and w/out focal def  HEENT:   no JVD  Cardiovascular:  Tachy rrr  Lungs:  Diffuse rhonchi, but wheeze improved  Abdomen:  Firm, + bowel sounds  Musculoskeletal:  Intact  Skin:  Trace LE edema    Recent Labs Lab 01/17/14 0805 01/18/14 0552 01/19/14 0931  NA 136 138 136  K 4.5 4.9 4.4  CL 92* 98 99  CO2 33* 32 31  BUN 24* 15 22  CREATININE 1.14 0.96 0.88  GLUCOSE 108* 174* 163*    Recent Labs Lab 01/17/14 0805 01/18/14 0552  HGB 12.5* 10.9*  HCT 41.0 35.1*  WBC 30.1* 29.9*  PLT 288 215    Recent Labs Lab 01/17/14 0805 01/17/14 0819 01/18/14 0202 01/18/14 0552 01/19/14 0330  PROCALCITON 2.11  --   --  2.22 1.68  WBC 30.1*  --    --  29.9*  --   LATICACIDVEN  --  2.73* 4.0*  --   --       Dg Chest Port 1 View  01/20/2014   CLINICAL DATA:  Respiratory distress.  History of sarcoidosis.  EXAM: PORTABLE CHEST - 1 VIEW  COMPARISON:  January 18, 2014  FINDINGS: Extensive scarring and reticulonodular interstitial prominence are stable, findings that may be seen as sequelae of sarcoidosis. There has been some interval clearing of infiltrate from the medial right base. There is no new opacity. Heart is upper normal in size. Pulmonary vascularity is stable and somewhat distorted due to the scarring. No new opacity present.  IMPRESSION: Extensive scarring and interstitial prominence, felt to be secondary to residua from sarcoidosis. Patchy infiltrate in the right base noted 2 days prior has partially cleared. No new opacity. No change in cardiac silhouette.   Electronically Signed   By: Bretta Bang M.D.   On: 01/20/2014 07:25  CXR unchanged c/w admit   ASSESSMENT / PLAN:  Acute on chronic respiratory hypoxemic failure - in setting of HCAP Sarcoidosis just d/c'd 1/6 s/p acute asthmatic like flare from RSV  Bronchiectasis related to sarcoid OSA - on CPAP  CXR stable  Plan:  BIPAP PRN HCAP coverage: vanc and zosyn 1/7>>> Scheduled BDs (avoid  atrovent given GI issues recently) Taper steroids over next several days O2 as needed to maintain O2 saturation > 90%  SIRS/sepsis d/t HCAP  ST: multifactorial in setting of SIRS and Continuous neb  Currently no evidence of end-organ dysfxn. Feels and looks better  Plan:  Continue BB and CCB Cont Empiric abx as mentioned above  Cont MIVF  Abdominal discomfort> due to constipation and some bloating Plan: Avoid atrovent Bowel regimen escalated  Careful w/ narcs Add mag citrate prn Decrease iron to qd (his home regimen)   Looks better. Will move him to regular ward 1/10    Levy Pupaobert Braedon Sjogren, MD, PhD 01/20/2014, 10:37 AM [facility] [phone] or if  no answer [phone]

## 2014-01-21 LAB — GLUCOSE, CAPILLARY
GLUCOSE-CAPILLARY: 138 mg/dL — AB (ref 70–99)
Glucose-Capillary: 177 mg/dL — ABNORMAL HIGH (ref 70–99)
Glucose-Capillary: 89 mg/dL (ref 70–99)
Glucose-Capillary: 114 mg/dL — ABNORMAL HIGH (ref 70–99)
Glucose-Capillary: 187 mg/dL — ABNORMAL HIGH (ref 70–99)

## 2014-01-22 ENCOUNTER — Ambulatory Visit: Payer: 59 | Admitting: Emergency Medicine

## 2014-01-22 ENCOUNTER — Inpatient Hospital Stay (HOSPITAL_COMMUNITY): Payer: 59

## 2014-01-22 DIAGNOSIS — M79609 Pain in unspecified limb: Secondary | ICD-10-CM

## 2014-01-22 DIAGNOSIS — R609 Edema, unspecified: Secondary | ICD-10-CM

## 2014-01-22 DIAGNOSIS — J189 Pneumonia, unspecified organism: Secondary | ICD-10-CM | POA: Diagnosis present

## 2014-01-22 DIAGNOSIS — J9601 Acute respiratory failure with hypoxia: Secondary | ICD-10-CM

## 2014-01-22 LAB — BASIC METABOLIC PANEL
ANION GAP: 7 (ref 5–15)
BUN: 21 mg/dL (ref 6–23)
CO2: 32 mmol/L (ref 19–32)
Calcium: 9.3 mg/dL (ref 8.4–10.5)
Chloride: 100 mEq/L (ref 96–112)
Creatinine, Ser: 0.84 mg/dL (ref 0.50–1.35)
GFR calc Af Amer: >90 mL/min (ref >90)
GFR calc non Af Amer: >90 mL/min (ref >90)
GLUCOSE: 200 mg/dL — AB (ref 70–99)
Potassium: 4.6 mmol/L (ref 3.5–5.1)
Sodium: 139 mmol/L (ref 135–145)

## 2014-01-22 LAB — GLUCOSE, CAPILLARY
GLUCOSE-CAPILLARY: 138 mg/dL — AB (ref 70–99)
GLUCOSE-CAPILLARY: 162 mg/dL — AB (ref 70–99)
GLUCOSE-CAPILLARY: 187 mg/dL — AB (ref 70–99)
Glucose-Capillary: 86 mg/dL (ref 70–99)

## 2014-01-22 MED ORDER — ENOXAPARIN SODIUM 30 MG/0.3ML ~~LOC~~ SOLN
30.0000 mg | SUBCUTANEOUS | Status: DC
  Administered 2014-01-22 – 2014-01-24 (×3): 30 mg via SUBCUTANEOUS
  Filled 2014-01-22 (×4): qty 0.3

## 2014-01-22 MED ORDER — PREDNISONE 20 MG PO TABS
40.0000 mg | ORAL_TABLET | Freq: Every day | ORAL | Status: DC
  Administered 2014-01-23 – 2014-01-28 (×6): 40 mg via ORAL
  Filled 2014-01-22 (×7): qty 2

## 2014-01-22 MED ORDER — CALCIUM CARBONATE ANTACID 500 MG PO CHEW
200.0000 mg | CHEWABLE_TABLET | Freq: Three times a day (TID) | ORAL | Status: DC | PRN
  Administered 2014-01-22 – 2014-01-24 (×2): 200 mg via ORAL
  Filled 2014-01-22 (×2): qty 1

## 2014-01-22 MED ORDER — CEPHALEXIN 500 MG PO CAPS
500.0000 mg | ORAL_CAPSULE | Freq: Four times a day (QID) | ORAL | Status: DC
  Administered 2014-01-22 – 2014-01-25 (×12): 500 mg via ORAL
  Filled 2014-01-22 (×17): qty 1

## 2014-01-22 MED ORDER — FUROSEMIDE 10 MG/ML IJ SOLN
20.0000 mg | Freq: Once | INTRAMUSCULAR | Status: AC
  Administered 2014-01-22: 20 mg via INTRAVENOUS
  Filled 2014-01-22: qty 2

## 2014-01-22 MED ORDER — COLCHICINE 0.6 MG PO TABS
1.2000 mg | ORAL_TABLET | Freq: Once | ORAL | Status: AC
  Administered 2014-01-22: 1.2 mg via ORAL
  Filled 2014-01-22: qty 2

## 2014-01-22 MED ORDER — POTASSIUM CHLORIDE CRYS ER 20 MEQ PO TBCR
40.0000 meq | EXTENDED_RELEASE_TABLET | Freq: Once | ORAL | Status: AC
Start: 2014-01-22 — End: 2014-01-22
  Administered 2014-01-22: 40 meq via ORAL
  Filled 2014-01-22: qty 2

## 2014-01-22 MED ORDER — SIMETHICONE 80 MG PO CHEW
160.0000 mg | CHEWABLE_TABLET | Freq: Four times a day (QID) | ORAL | Status: DC
  Administered 2014-01-22 – 2014-01-28 (×22): 160 mg via ORAL
  Filled 2014-01-22 (×25): qty 2

## 2014-01-22 MED ORDER — LEVOFLOXACIN 500 MG PO TABS
500.0000 mg | ORAL_TABLET | Freq: Every day | ORAL | Status: DC
  Administered 2014-01-22 – 2014-01-23 (×2): 500 mg via ORAL
  Filled 2014-01-22 (×2): qty 1

## 2014-01-22 NOTE — Progress Notes (Addendum)
Name: [name] MRN: [id number] DOB: 07/18/1965    ADMISSION DATE:  01/17/2014 CONSULTATION DATE:  1/7  REFERRING MD :  [name]  CHIEF COMPLAINT:  Acute respiratory failure   BRIEF PATIENT DESCRIPTION:  49 year old AAM well known to our svc & f/b RB for chronic respiratory failure in setting of Sarcoidosis (Dx'd by tbBx 02), just discharged on 1/6 after being treated for asthmatic bronchitis flare in setting of RSV infection. Returned to the hospital on 1/7 w/ < 24 hr new c/o productive cough brown blood tinged sputum, fever > 101, and progressive respiratory distress. CXR c/w new RLL infiltrate superimposed on chronic scarring. Admitted to SDU w/ working dx of HCAP.   SIGNIFICANT EVENTS  1/6 just d/c after asthmatic-like flare in setting of RSV virus.  1/7 re-admitted w/ HCAP  01/20/14 - feels better   SUBJECTIVE/OVERNIGHT/INTERVAL HX 01/22/14: Says some better since admit and even in last 24h but nowhere close to baseline. Per RN who spoke to RT: resp patttern today is similar to last few days. C/o bruising in abdomen and forearm at IV and SQ heparin sites x since admit 01/17/2014; not worse. Maintained on 3L o2  VITAL SIGNS: Temp:  [97.7 F (36.5 C)-98.6 F (37 C)] 97.7 F (36.5 C) (01/12 0529) Pulse Rate:  [96-111] 102 (01/12 0529) Resp:  [16-22] 20 (01/12 0529) BP: (114-138)/(58-109) 126/89 mmHg (01/12 0529) SpO2:  [99 %-100 %] 99 % (01/12 0529) Weight:  [89.676 kg (197 lb 11.2 oz)] 89.676 kg (197 lb 11.2 oz) (01/12 0500) [place] PHYSICAL EXAMINATION: General:  Chronically ill looking Neuro:  Awake, oriented and w/out focal def  HEENT:  [place] no JVD  Cardiovascular:  Tachy rrr  Lungs:  NO rhonchi,OR  wheeze improved . Paradoxical respiration when he sleeps Abdomen:  Firm, + bowel sounds . Protuberant abdomen with visceral obesity + Musculoskeletal:  Intact  Skin:  1+ LE edema  ? Worse. ALSO BRUISING + on injection sites in groin and forearms  PULMONARY  Recent Labs Lab  01/17/14 0825 01/20/14 0444  PHART 7.458* 7.415  PCO2ART 44.3 48.0*  PO2ART 60.7* 122.0*  HCO3 30.9* 30.4*  TCO2 27.4 27.7  O2SAT 90.0 98.5    CBC  Recent Labs Lab 01/17/14 0805 01/18/14 0552  HGB 12.5* 10.9*  HCT 41.0 35.1*  WBC 30.1* 29.9*  PLT 288 215    COAGULATION No results for input(s): INR in the last 168 hours.  CARDIAC  No results for input(s): TROPONINI in the last 168 hours. No results for input(s): PROBNP in the last 168 hours.   CHEMISTRY  Recent Labs Lab 01/17/14 0805 01/18/14 0552 01/19/14 0931 01/22/14 0425  NA 136 138 136 139  K 4.5 4.9 4.4 4.6  CL 92* 98 99 100  CO2 33* 32 31 32  GLUCOSE 108* 174* 163* 200*  BUN 24* 15 22 21   CREATININE 1.14 0.96 0.88 0.84  CALCIUM 8.8 8.7 8.8 9.3  MG  --  2.6*  --   --   PHOS  --  3.2  --   --    Estimated Creatinine Clearance: 113.6 mL/min (by C-G formula based on Cr of 0.84).   LIVER  Recent Labs Lab 01/17/14 0805  AST 72*  ALT 57*  ALKPHOS 91  BILITOT 1.0  PROT 7.2  ALBUMIN 3.7     INFECTIOUS  Recent Labs Lab 01/17/14 0805 01/17/14 0819 01/18/14 0202 01/18/14 0552 01/19/14 0330  LATICACIDVEN  --  2.73* 4.0*  --   --  PROCALCITON 2.11  --   --  2.22 1.68     ENDOCRINE CBG (last 3)   Recent Labs  01/21/14 1743 01/21/14 2108 01/22/14 0735  GLUCAP 187* 138* 138*         IMAGING x48h No results found.   MICRO Results for orders placed or performed during the hospital encounter of 01/17/14  Culture, blood (routine x 2)     Status: None (Preliminary result)   Collection Time: 01/17/14  8:05 AM  Result Value Ref Range Status   Specimen Description BLOOD RIGHT ANTECUBITAL  Final   Special Requests BOTTLES DRAWN AEROBIC AND ANAEROBIC  Final   Culture   Final           BLOOD CULTURE RECEIVED NO GROWTH TO DATE CULTURE WILL BE HELD FOR 5 DAYS BEFORE ISSUING A FINAL NEGATIVE REPORT Performed at [facility]    Report Status PENDING  Incomplete  Blood  culture (routine x 2)     Status: None (Preliminary result)   Collection Time: 01/17/14  8:30 AM  Result Value Ref Range Status   Specimen Description BLOOD LEFT FOREARM  Final   Special Requests BOTTLES DRAWN AEROBIC AND ANAEROBIC 3CC  Final   Culture   Final           BLOOD CULTURE RECEIVED NO GROWTH TO DATE CULTURE WILL BE HELD FOR 5 DAYS BEFORE ISSUING A FINAL NEGATIVE REPORT Performed at [facility]    Report Status PENDING  Incomplete  Culture, respiratory (tracheal aspirate)     Status: None   Collection Time: 01/18/14  8:20 AM  Result Value Ref Range Status   Specimen Description SPUTUM  Final   Special Requests NONE  Final   Gram Stain   Final    FEW WBC PRESENT,BOTH PMN AND MONONUCLEAR RARE SQUAMOUS EPITHELIAL CELLS PRESENT FEW GRAM NEGATIVE RODS RARE GRAM POSITIVE COCCI IN PAIRS IN CLUSTERS    Culture   Final    NORMAL OROPHARYNGEAL FLORA Performed at [facility]    Report Status 01/20/2014 FINAL  Final  Culture, expectorated sputum-assessment     Status: None   Collection Time: 01/18/14  8:20 AM  Result Value Ref Range Status   Specimen Description SPUTUM  Final   Special Requests NONE  Final   Sputum evaluation   Final    THIS SPECIMEN IS ACCEPTABLE. RESPIRATORY CULTURE REPORT TO FOLLOW.   Report Status 01/18/2014 FINAL  Final       ASSESSMENT / PLAN:  Acute on chronic respiratory hypoxemic failure - in setting of HCAP Sarcoidosis just d/c'd 1/6 s/p acute asthmatic like flare from RSV  Bronchiectasis related to sarcoid OSA - on CPAP    - 01/22/14: Reportedly unchanged  / Subjectively not at baseline ? Has some diastolic dysfun based on some edema.   Plan:   CPAP QHS HCAP coverage: vanc 01/17/14 - 01/22/14,. zosyn 1/7>>>01/22/14 - change to levaquin po ; aim total 8 days Scheduled BDs (avoid atrovent given GI issues recently) Taper steroids over next several days - cut to  daily fro  on 01/22/2014 O2 as needed to maintain O2  saturation > 90% (currently needing 3L) Get CT chest 01/22/2014 Diurese 01/22/2014 - has edema, check bnp as well   SIRS/sepsis d/t HCAP  ST: multifactorial in setting of SIRS and Continuous neb  Currently no evidence of end-organ dysfxn. Feels and looks better  Plan:  Continue BB and CCB Cont Empiric abx as mentioned above  Change to saline lock  Abdominal discomfort> due to constipation and some bloating  - denies this on 01/22/14  Plan: Avoid atrovent Bowel regimen e Careful w/ narcs Add mag citrate prn Decrease iron to qd (his home regimen)   SKIN A: Burising from tid heparin on ijnection sites P Change to daily lovenox   OVERALL A: Deconditioned P PT consult   GLOBAL Patient updated. Needs to stay a few more days. Might benefit from LTAC/ SNF rehab  Dr. Kalman Shan, M.D., [facility].C.P Pulmonary and Critical Care Medicine Staff Physician [facility]  Pulmonary and Critical Care Pager: [phone], If no answer or between  15:00h - 7:00h: call [phone]  01/22/2014 10:14 AM

## 2014-01-22 NOTE — Progress Notes (Signed)
Pt places CPAP on and off throughout the night, Pt states that he doesn't need any assistance with his CPAP at this time.  RT to monitor and assess as needed.

## 2014-01-22 NOTE — Progress Notes (Signed)
S: Called to bedside to assess LLE foot.  Patient developed acute onset foot pain with erythema and warmth.    O: Blood pressure 124/81, pulse 112, temperature 97.7 F (36.5 C), temperature source Oral, resp. rate 20, height 5\' 7"  (1.702 m), weight 197 lb 11.2 oz (89.676 kg), SpO2 94 %.  Exam: General: no distress Neuro: AAOx4 Extremities: BLE edema 2+ pitting, LLE with erythema / warmth past all digits to metatarsals.  No open areas or lesions noted.  RLE without warmth.    A: Presumed Cellulitis   P: Keflex 500 mg PO QID Assess LLE venous duplex Cholchicine 1.2 mg PO x 1 (sr cr 0.84) Elevate LE's  Monitor site  [name] BrimBrandi Jaston Havens, NP-C [place] [facility] Pgr: [phone] or [phone]

## 2014-01-22 NOTE — Progress Notes (Signed)
Left lower extremity venous duplex completed.  Left:  No evidence of DVT, superficial thrombosis, or Baker's cyst.  Right:  Negative for DVT in the common femoral vein.  

## 2014-01-23 DIAGNOSIS — M79672 Pain in left foot: Secondary | ICD-10-CM

## 2014-01-23 DIAGNOSIS — D869 Sarcoidosis, unspecified: Secondary | ICD-10-CM

## 2014-01-23 DIAGNOSIS — M7981 Nontraumatic hematoma of soft tissue: Secondary | ICD-10-CM

## 2014-01-23 DIAGNOSIS — R238 Other skin changes: Secondary | ICD-10-CM | POA: Diagnosis not present

## 2014-01-23 DIAGNOSIS — R233 Spontaneous ecchymoses: Secondary | ICD-10-CM | POA: Diagnosis not present

## 2014-01-23 DIAGNOSIS — L819 Disorder of pigmentation, unspecified: Secondary | ICD-10-CM

## 2014-01-23 DIAGNOSIS — D860 Sarcoidosis of lung: Secondary | ICD-10-CM

## 2014-01-23 DIAGNOSIS — M79671 Pain in right foot: Secondary | ICD-10-CM | POA: Diagnosis not present

## 2014-01-23 DIAGNOSIS — R600 Localized edema: Secondary | ICD-10-CM

## 2014-01-23 DIAGNOSIS — L03119 Cellulitis of unspecified part of limb: Secondary | ICD-10-CM

## 2014-01-23 DIAGNOSIS — L02619 Cutaneous abscess of unspecified foot: Secondary | ICD-10-CM

## 2014-01-23 DIAGNOSIS — J189 Pneumonia, unspecified organism: Secondary | ICD-10-CM

## 2014-01-23 LAB — BASIC METABOLIC PANEL
Anion gap: 6 (ref 5–15)
BUN: 17 mg/dL (ref 6–23)
CO2: 34 mmol/L — AB (ref 19–32)
Calcium: 9.1 mg/dL (ref 8.4–10.5)
Chloride: 98 mEq/L (ref 96–112)
Creatinine, Ser: 0.92 mg/dL (ref 0.50–1.35)
GFR calc Af Amer: >90 mL/min (ref >90)
GFR calc non Af Amer: >90 mL/min (ref >90)
Glucose, Bld: 148 mg/dL — ABNORMAL HIGH (ref 70–99)
POTASSIUM: 4.1 mmol/L (ref 3.5–5.1)
SODIUM: 138 mmol/L (ref 135–145)

## 2014-01-23 LAB — CULTURE, BLOOD (ROUTINE X 2)
Culture: NO GROWTH
Culture: NO GROWTH

## 2014-01-23 LAB — CBC
HCT: 35.4 % — ABNORMAL LOW (ref 39.0–52.0)
HEMOGLOBIN: 10.7 g/dL — AB (ref 13.0–17.0)
MCH: 26.6 pg (ref 26.0–34.0)
MCHC: 30.2 g/dL (ref 30.0–36.0)
MCV: 87.8 fL (ref 78.0–100.0)
Platelets: 291 10*3/uL (ref 150–400)
RBC: 4.03 MIL/uL — AB (ref 4.22–5.81)
RDW: 14.3 % (ref 11.5–15.5)
WBC: 14.8 10*3/uL — AB (ref 4.0–10.5)

## 2014-01-23 LAB — PROTIME-INR
INR: 0.97 (ref 0.00–1.49)
PROTHROMBIN TIME: 13 s (ref 11.6–15.2)

## 2014-01-23 LAB — LACTIC ACID, PLASMA: LACTIC ACID, VENOUS: 2.2 mmol/L (ref 0.5–2.2)

## 2014-01-23 LAB — GLUCOSE, CAPILLARY
GLUCOSE-CAPILLARY: 192 mg/dL — AB (ref 70–99)
Glucose-Capillary: 155 mg/dL — ABNORMAL HIGH (ref 70–99)
Glucose-Capillary: 110 mg/dL — ABNORMAL HIGH (ref 70–99)

## 2014-01-23 LAB — CK TOTAL AND CKMB (NOT AT ARMC)
CK TOTAL: 118 U/L (ref 7–232)
CK, MB: 6.4 ng/mL (ref 0.3–4.0)
RELATIVE INDEX: 5.4 — AB (ref 0.0–2.5)

## 2014-01-23 LAB — TROPONIN I: Troponin I: <0.03 ng/mL (ref <0.031)

## 2014-01-23 LAB — SEDIMENTATION RATE: SED RATE: 55 mm/h — AB (ref 0–16)

## 2014-01-23 LAB — PHOSPHORUS: Phosphorus: 3.7 mg/dL (ref 2.3–4.6)

## 2014-01-23 LAB — MAGNESIUM: MAGNESIUM: 1.9 mg/dL (ref 1.5–2.5)

## 2014-01-23 LAB — BRAIN NATRIURETIC PEPTIDE: B Natriuretic Peptide: 127.1 pg/mL — ABNORMAL HIGH (ref 0.0–100.0)

## 2014-01-23 MED ORDER — FENTANYL CITRATE 0.05 MG/ML IJ SOLN
50.0000 ug | Freq: Once | INTRAMUSCULAR | Status: AC
  Administered 2014-01-23: 50 ug via INTRAVENOUS
  Filled 2014-01-23: qty 2

## 2014-01-23 MED ORDER — DEXTROSE 5 % IV SOLN
2.0000 g | Freq: Once | INTRAVENOUS | Status: AC
  Administered 2014-01-23: 2 g via INTRAVENOUS
  Filled 2014-01-23: qty 4

## 2014-01-23 MED ORDER — MAGNESIUM SULFATE 2 GM/50ML IV SOLN
2.0000 g | Freq: Once | INTRAVENOUS | Status: DC
  Filled 2014-01-23: qty 50

## 2014-01-23 MED ORDER — FUROSEMIDE 10 MG/ML IJ SOLN
20.0000 mg | Freq: Once | INTRAMUSCULAR | Status: AC
  Administered 2014-01-23: 20 mg via INTRAVENOUS
  Filled 2014-01-23: qty 2

## 2014-01-23 NOTE — Consult Note (Signed)
[facility]    Date of Admission:  01/17/2014    Total days of antibiotics 7        Day 2                Reason for Consult: Possible cellulitis of toes    Referring Physician: Dr. Kalman Shan  Principal Problem:   Discoloration of skin of toe Active Problems:   Abnormal bruising   Foot pain, bilateral   Acute on chronic respiratory failure with hypoxia   HCAP (healthcare-associated pneumonia)   . albuterol  2.5 mg Nebulization Q4H  . arformoterol  15 mcg Nebulization Q12H  . budesonide (PULMICORT) nebulizer solution  0.5 mg Nebulization Q12H  . calcium-vitamin D  1 tablet Oral q morning - 10a  . cephALEXin  500 mg Oral 4 times per day  . cholecalciferol  1,000 Units Oral q morning - 10a  . diltiazem  30 mg Oral 3 times per day  . enoxaparin (LOVENOX) injection  30 mg Subcutaneous Q24H  . famotidine  20 mg Oral BID  . ferrous sulfate  325 mg Oral Q breakfast  . fluticasone  2 spray Each Nare BID  . insulin aspart  0-9 Units Subcutaneous TID WC  . loratadine  10 mg Oral Daily  . metoprolol tartrate  50 mg Oral BID  . multivitamin with minerals  1 tablet Oral q morning - 10a  . polyethylene glycol  17 g Oral Daily  . predniSONE  40 mg Oral Q breakfast  . senna-docusate  2 tablet Oral BID  . simethicone  160 mg Oral QID    Recommendations: 1. Continue cephalexin for now 2. Cryoglobulins 3. Sedimentation rate and C-reactive protein 4. Complement levels 5. Rheumatoid factor and ANA   Assessment: [name] has developed easy bruisability and discoloration of his toes. I doubt that this is cellulitis. Would be very unusual to develop a new infection like this while on antibiotics, especially in both feet at the same time. I think he may have acrocyanosis and wondered if this could be a complication of his sarcoidosis and/or Lovenox therapy. I will check inflammatory markers and cryoglobulin levels and follow-up tomorrow.   HPI:  [name] is a 49 y.o. 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 His RSV PCR was positive. He was managed with prednisone and was discharged home on 01/16/2014. The following day he developed sudden onset of high fever, chills and blood-tinged sputum. He was readmitted and placed on vancomycin and piperacillin tazobactam for HCAP. He improved fairly promptly and after 5 days of empiric antibiotic therapy he was switched to oral levofloxacin.  Three days ago he began to note easy bruisability. He has been on Lovenox. He also began to notice discoloration of his toes associated with some swelling, burning and stinging. He now also notes some purplish discoloration over his kneecaps and elbows. He has never had anything like this before. He has defervesced and states that his cough and shortness of breath are much better.   Review of Systems: Constitutional: positive for chills and fevers, negative for anorexia, sweats and weight loss Eyes: negative Ears, nose, mouth, throat, and face: negative Respiratory: as noted in history of present illness Cardiovascular: negative Gastrointestinal: negative Genitourinary:negative  Past Medical History  Diagnosis Date  . Hypertension   . Sarcoidosis   . OSA on CPAP   .  Chronic respiratory failure     home oxygen PRN  . Bronchitis   . Pneumonia   . COPD (chronic obstructive pulmonary disease)   . Neuromuscular disorder     History  Substance Use Topics  . Smoking status: Never Smoker   . Smokeless tobacco: Never Used  . Alcohol Use: Yes     Comment: social    Family History  Problem Relation Age of Onset  . Diabetes Father    Allergies  Allergen Reactions  . Clindamycin/Lincomycin Anaphylaxis and Swelling    Made face and tongue swell    OBJECTIVE: Blood pressure 146/106, pulse 98, temperature 97.6 F (36.4 C), temperature source Oral, resp. rate 20, height   (1.702 m), weight 197 lb 11.2 oz (89.676 kg), SpO2 98 %. General: He is in no distress. He is up walking in his room visiting with a friend. Skin: He has scattered, large bruises on his abdomen and arms. He has reddish purple discoloration of all toes associated with some nonpitting edema. He has palmar erythema and dry skin on his hands. He has dusky erythema over both knees and elbows Lungs: Basilar crackles Cor: Regular S1 and S2 with no murmurs Abdomen: Obese, soft and nontender  Lab Results Lab Results  Component Value Date   WBC 14.8* 01/23/2014   HGB 10.7* 01/23/2014   HCT 35.4* 01/23/2014   MCV 87.8 01/23/2014   PLT 291 01/23/2014    Lab Results  Component Value Date   CREATININE 0.92 01/23/2014   BUN 17 01/23/2014   NA 138 01/23/2014   K 4.1 01/23/2014   CL 98 01/23/2014   CO2 34* 01/23/2014    Lab Results  Component Value Date   ALT 57* 01/17/2014   AST 72* 01/17/2014   ALKPHOS 91 01/17/2014   BILITOT 1.0 01/17/2014     Microbiology: Recent Results (from the past 240 hour(s))  Culture, blood (routine x 2)     Status: None   Collection Time: 01/17/14  8:05 AM  Result Value Ref Range Status   Specimen Description BLOOD RIGHT ANTECUBITAL  Final   Special Requests BOTTLES DRAWN AEROBIC AND ANAEROBIC  Final   Culture   Final    NO GROWTH 5 DAYS Performed at [facility]    Report Status 01/23/2014 FINAL  Final  Blood culture (routine x 2)     Status: None   Collection Time: 01/17/14  8:30 AM  Result Value Ref Range Status   Specimen Description BLOOD LEFT FOREARM  Final   Special Requests BOTTLES DRAWN AEROBIC AND ANAEROBIC 3CC  Final   Culture   Final    NO GROWTH 5 DAYS Performed at [facility]    Report Status 01/23/2014 FINAL  Final  Culture, respiratory (tracheal aspirate)     Status: None   Collection Time: 01/18/14  8:20 AM  Result Value Ref Range Status   Specimen Description SPUTUM  Final   Special Requests NONE  Final    Gram Stain   Final    FEW WBC PRESENT,BOTH PMN AND MONONUCLEAR RARE SQUAMOUS EPITHELIAL CELLS PRESENT FEW GRAM NEGATIVE RODS RARE GRAM POSITIVE COCCI IN PAIRS IN CLUSTERS    Culture   Final    NORMAL OROPHARYNGEAL FLORA Performed at [facility]    Report Status 01/20/2014 FINAL  Final  Culture, expectorated sputum-assessment     Status: None   Collection Time: 01/18/14  8:20 AM  Result Value Ref Range Status   Specimen  Description SPUTUM  Final   Special Requests NONE  Final   Sputum evaluation   Final    THIS SPECIMEN IS ACCEPTABLE. RESPIRATORY CULTURE REPORT TO FOLLOW.   Report Status 01/18/2014 FINAL  Final    Cliffton AstersJohn Lache Dagher, MD [facility] [phone] pager   [phone] cell 01/23/2014, 5:55 PM

## 2014-01-23 NOTE — Progress Notes (Signed)
PT Cancellation Note and DISCHARGE  Patient Details Name: [name] Baumannommy Buckner MRN: [id number] DOB: 01/15/1965   Cancelled Treatment:    Reason Eval/Treat Not Completed: Patient declined.  He stated his feet are hurting too bad to work with PT.  "Why do they think I can walk on these?"  Pt stated he would not like to do any therapy for at least 2 days and wants to figure out what is wrong with his feet before he does any PT.  Pt doesn't want PT to come in and check on him and would prefer to d/c PT order and have MD re-order when his feet are better.  Will d/c from PT caseload. PLEASE RE-ORDER when appropriate.  Thank you for the referral.    [facility],Zaylei Mullane LUBECK 01/23/2014, 12:39 PM

## 2014-01-23 NOTE — Progress Notes (Addendum)
Name: [name] MRN: [id number] DOB: [birth year]    ADMISSION DATE:  01/17/2014 CONSULTATION DATE:  1/7  REFERRING MD :  [name]  CHIEF COMPLAINT:  Acute respiratory failure   BRIEF PATIENT DESCRIPTION:  49 year old AAM well known to our svc & f/b RB for chronic respiratory failure in setting of Sarcoidosis (Dx'd by tbBx 02), just discharged on 1/6 after being treated for asthmatic bronchitis flare in setting of RSV infection. Returned to the hospital on 1/7 w/ < 24 hr new c/o productive cough brown blood tinged sputum, fever > 101, and progressive respiratory distress. CXR c/w new RLL infiltrate superimposed on chronic scarring. Admitted to SDU w/ working dx of HCAP.   SIGNIFICANT EVENTS  1/6 just d/c after asthmatic-like flare in setting of RSV virus.  1/7 re-admitted w/ HCAP  01/20/14 - feels better 01/22/14: Says some better since admit and even in last 24h but nowhere close to baseline. Per RN who spoke to RT: resp patttern today is similar to last few days. C/o bruising in abdomen and forearm at IV and SQ heparin sites x since admit 01/17/2014; not worse. Maintained on 3L o2   SUBJECTIVE/OVERNIGHT/INTERVAL HX .01/23/2014: Post rounds yesterday developed new onset acute abrupt redness left toes just after IV abx stopped. Started Keflex . Today new small blister between first and 2nd toe and worsening redness in left aling with redness right feet. Warm and tender. C/p pain that is severe without relief from morphine   VITAL SIGNS: Temp:  [97.6 F (36.4 C)-98.7 F (37.1 C)] 97.6 F (36.4 C) (01/13 0631) Pulse Rate:  [92-112] 98 (01/13 0631) Resp:  [20] 20 (01/13 0631) BP: (124-155)/(81-110) 146/106 mmHg (01/13 0631) SpO2:  [91 %-100 %] 98 % (01/13 0835) [place] PHYSICAL EXAMINATION: General:  Chronically ill looking Neuro:  Awake, oriented and w/out focal def  HEENT:  [place] no JVD  Cardiovascular:  Tachy rrr  Lungs:  NO rhonchi,OR  wheeze improved . Paradoxical respiration when he  sleeps Abdomen:  Firm, + bowel sounds . Protuberant abdomen with visceral obesity + Musculoskeletal:  Intact  Skin:  STABLE BRUISING + on injection sites in groin and forearms. HAs RED, WARM BILATERAL TOES with extension into distal dorsum. One small 0.5cm blister between left first and second toes Ext: ++ edema  PULMONARY  Recent Labs Lab 01/17/14 0825 01/20/14 0444  PHART 7.458* 7.415  PCO2ART 44.3 48.0*  PO2ART 60.7* 122.0*  HCO3 30.9* 30.4*  TCO2 27.4 27.7  O2SAT 90.0 98.5    CBC  Recent Labs Lab 01/17/14 0805 01/18/14 0552 01/23/14 0510  HGB 12.5* 10.9* 10.7*  HCT 41.0 35.1* 35.4*  WBC 30.1* 29.9* 14.8*  PLT 288 215 291    COAGULATION  Recent Labs Lab 01/23/14 0510  INR 0.97    CARDIAC  No results for input(s): TROPONINI in the last 168 hours. No results for input(s): PROBNP in the last 168 hours.   CHEMISTRY  Recent Labs Lab 01/17/14 0805 01/18/14 0552 01/19/14 0931 01/22/14 0425 01/23/14 0510  NA 136 138 136 139 138  K 4.5 4.9 4.4 4.6 4.1  CL 92* 98 99 100 98  CO2 33* 32 31 32 34*  GLUCOSE 108* 174* 163* 200* 148*  BUN 24* CREATININE 1.14 0.96 0.88 0.84 0.92  CALCIUM 8.8 8.7 8.8 9.3 9.1  MG  --  2.6*  --   --  1.9  PHOS  --  3.2  --   --  3.7  Estimated Creatinine Clearance: 103.7 mL/min (by C-G formula based on Cr of 0.92).   LIVER  Recent Labs Lab 01/17/14 0805 01/23/14 0510  AST 72*  --   ALT 57*  --   ALKPHOS 91  --   BILITOT 1.0  --   PROT 7.2  --   ALBUMIN 3.7  --   INR  --  0.97     INFECTIOUS  Recent Labs Lab 01/17/14 0805 01/17/14 0819 01/18/14 0202 01/18/14 0552 01/19/14 0330  LATICACIDVEN  --  2.73* 4.0*  --   --   PROCALCITON 2.11  --   --  2.22 1.68     ENDOCRINE CBG (last 3)   Recent Labs  01/22/14 1252 01/22/14 1709 01/22/14 2110  GLUCAP 162* 187* 86         IMAGING x48h Ct Chest Wo Contrast  01/22/2014   CLINICAL DATA:  Chronic respiratory failure. Sarcoidosis.  Productive cough. Fever. Respiratory distress. Shortness of breath.  EXAM: CT CHEST WITHOUT CONTRAST  TECHNIQUE: Multidetector CT imaging of the chest was performed following the standard protocol without IV contrast.  COMPARISON:  Multiple exams, including 01/20/2014 and 10/19/2013  FINDINGS: Mediastinum/Nodes: Mediastinal and hilar adenopathy with scattered calcifications compatible with patient's known sarcoidosis. Index right lower paratracheal lymph node 1.3 cm in short axis on image 22 series 2, previously 1.4 cm. Hilar adenopathy likewise similar to prior, difficult to measure due to lack of IV contrast and surrounding opacities.  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 Peribronchovascular and paraseptal nodularity with scattered nodularity observed in both lungs. Perihilar airspace opacity with confluent nodularity, not this similar to prior exam. Central airway narrowing observed especially in the right upper lobe. Scattered volume loss in both lungs.  There is increased ground-glass opacity and increased secondary pulmonary lobular septal accentuation in the right lower lobe, for example comparing image 42 of series 5 of today' s exam with image 39 of series 3 of the 10/19/2013 exam. There is also some increased in nodularity in the affected regions. Appearance is suspicious for superimposed alveolitis/atypical infectious process.  Upper abdomen: Unremarkable  Musculoskeletal: Unremarkable  IMPRESSION: 1. Considerable mediastinal and hilar adenopathy with severe parenchymal manifestations of sarcoidosis. 2. Alveolitis with increased nodularity in the right lower lobe favoring atypical infectious process - this is new compared to prior chest CT of 10/19/2013.   Electronically Signed   By: Herbie Baltimore M.D.   On: 01/22/2014 15:53     MICRO Results for orders placed or performed during the hospital encounter of 01/17/14  Culture, blood (routine x 2)     Status: None  (Preliminary result)   Collection Time: 01/17/14  8:05 AM  Result Value Ref Range Status   Specimen Description BLOOD RIGHT ANTECUBITAL  Final   Special Requests BOTTLES DRAWN AEROBIC AND ANAEROBIC  Final   Culture   Final           BLOOD CULTURE RECEIVED NO GROWTH TO DATE CULTURE WILL BE HELD FOR 5 DAYS BEFORE ISSUING A FINAL NEGATIVE REPORT Performed at [facility]    Report Status PENDING  Incomplete  Blood culture (routine x 2)     Status: None (Preliminary result)   Collection Time: 01/17/14  8:30 AM  Result Value Ref Range Status   Specimen Description BLOOD LEFT FOREARM  Final   Special Requests BOTTLES DRAWN AEROBIC AND ANAEROBIC 3CC  Final   Culture   Final  BLOOD CULTURE RECEIVED NO GROWTH TO DATE CULTURE WILL BE HELD FOR 5 DAYS BEFORE ISSUING A FINAL NEGATIVE REPORT [facility]    Report Status PENDING  Incomplete  Culture, respiratory (tracheal aspirate)     Status: None   Collection Time: 01/18/14  8:20 AM  Result Value Ref Range Status   Specimen Description SPUTUM  Final   Special Requests NONE  Final   Gram Stain   Final    FEW WBC PRESENT,BOTH PMN AND MONONUCLEAR RARE SQUAMOUS EPITHELIAL CELLS PRESENT FEW GRAM NEGATIVE RODS RARE GRAM POSITIVE COCCI IN PAIRS IN CLUSTERS    Culture   Final    NORMAL OROPHARYNGEAL FLORA [facility]formed at [facility]    Report Status 01/20/2014 FINAL  Final  Culture, expectorated sputum-assessment     Status: None   Collection Time: 01/18/14  8:20 AM  Result Value Ref Range Status   Specimen Description SPUTUM  Final   Special Requests NONE  Final   Sputum evaluation   Final    THIS SPECIMEN IS ACCEPTABLE. RESPIRATORY CULTURE REPORT TO FOLLOW.   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 But suspect onging associated acute diast dysfn  Plan:   CPAP QHS HCAP coverage  - vanc 01/17/14 - 01/22/14  - ,. zosyn 1/7>>>01/22/14  -  levaquin 01/22/13 >> 01/1314 (stopped due to C Diff risk )   - Keflex Po 01/22/14 (for HCAP and new cellulitis on 01/22/14) >>>  - Scheduled BDs (avoid atrovent given GI issues recently) Taper steroids over next several days - cut to 40mg  daily fro 60mg  on 01/22/2014 O2 as needed to maintain O2 saturation > 90% (currently needing 3L)  Diurese again  01/23/2014 - has edema,  (bnp high)    Abdominal discomfort> due to constipation and some bloating  - denies this on 01/22/14 and 01/23/2014   Plan: Avoid atrovent Bowel regimen e Careful w/ narcs Add mag citrate prn Decrease iron to qd (his home regimen)   SKIN ? ID A: [place] from tid heparin on ijnection sites since admit 01/17/2014 NEw possible cellulitis feet L > R since 01/22/14    - cellulitis presumptive worse 01/23/2014 despite IV abxc and abx change to keflex    P daily lovenox instead of heparin ID consult Continue keflex for now Check lactate CK   OVERALL A: Deconditioned P PT consult   NEURO A Feet pain P Opiouids prn  GLOBAL Patient updated. Needs to stay a few more days. Might benefit from LTAC/ SNF rehab  Dr. Kalman ShanMurali Lleyton Byers, M.D., [facility].C.P Pulmonary and Critical Care Medicine Staff Physician [facility] and Critical [facility] Pager: [phone], If no answer or between  15:00h - 7:00h: call [phone]  01/23/2014 10:13 AM

## 2014-01-23 NOTE — Progress Notes (Signed)
Pt prefers to placed himself on CPAP when ready. CPAP is set at [facility]10cmH2O per home settings with 4Lpm of oxygen bled in via nasal cannula (pt preference). Pt was encouraged to contact RT for assistance if needed. RT will continue to monitor as needed.

## 2014-01-24 DIAGNOSIS — I7381 Erythromelalgia: Secondary | ICD-10-CM

## 2014-01-24 LAB — GLUCOSE, CAPILLARY
GLUCOSE-CAPILLARY: 160 mg/dL — AB (ref 70–99)
GLUCOSE-CAPILLARY: 178 mg/dL — AB (ref 70–99)
Glucose-Capillary: 82 mg/dL (ref 70–99)
Glucose-Capillary: 124 mg/dL — ABNORMAL HIGH (ref 70–99)

## 2014-01-24 LAB — ANA: Anti Nuclear Antibody(ANA): NEGATIVE

## 2014-01-24 LAB — C-REACTIVE PROTEIN: CRP: 2.2 mg/dL — ABNORMAL HIGH (ref <0.60)

## 2014-01-24 LAB — PROCALCITONIN

## 2014-01-24 LAB — ANGIOTENSIN CONVERTING ENZYME: Angiotensin-Converting Enzyme: 18 U/L (ref 8–52)

## 2014-01-24 LAB — RHEUMATOID FACTOR: Rhuematoid fact SerPl-aCnc: <10 IU/mL (ref <=14)

## 2014-01-24 LAB — C3 COMPLEMENT: C3 Complement: 174 mg/dL (ref 90–180)

## 2014-01-24 LAB — C4 COMPLEMENT: COMPLEMENT C4, BODY FLUID: 44 mg/dL — AB (ref 10–40)

## 2014-01-24 MED ORDER — FENTANYL CITRATE 0.05 MG/ML IJ SOLN
25.0000 ug | INTRAMUSCULAR | Status: DC | PRN
  Administered 2014-01-24 – 2014-01-28 (×6): 50 ug via INTRAVENOUS
  Filled 2014-01-24 (×6): qty 2

## 2014-01-24 MED ORDER — MAGNESIUM SULFATE 50 % IJ SOLN
2.0000 g | Freq: Once | INTRAVENOUS | Status: AC
  Administered 2014-01-24: 2 g via INTRAVENOUS
  Filled 2014-01-24: qty 4

## 2014-01-24 MED ORDER — MAGNESIUM SULFATE 2 GM/50ML IV SOLN
2.0000 g | Freq: Once | INTRAVENOUS | Status: DC
Start: 2014-01-24 — End: 2014-01-24

## 2014-01-24 MED ORDER — KETOROLAC TROMETHAMINE 30 MG/ML IJ SOLN
30.0000 mg | Freq: Once | INTRAMUSCULAR | Status: AC
  Administered 2014-01-24: 30 mg via INTRAVENOUS
  Filled 2014-01-24: qty 1

## 2014-01-24 MED ORDER — FENTANYL CITRATE 0.05 MG/ML IJ SOLN
25.0000 ug | Freq: Once | INTRAMUSCULAR | Status: AC
  Administered 2014-01-24: 25 ug via INTRAVENOUS
  Filled 2014-01-24: qty 2

## 2014-01-24 MED ORDER — POTASSIUM IODIDE 1 GM/ML PO SOLN
200.0000 mg | Freq: Three times a day (TID) | ORAL | Status: DC
  Administered 2014-01-24 – 2014-01-25 (×5): 200 mg via ORAL
  Filled 2014-01-24 (×5): qty 30

## 2014-01-24 NOTE — Progress Notes (Signed)
Patient prefers to wear CPAP when he feels like he needs it. RT added water to water chamber for humidification. Patient home setting is 10 cmH2O. Patient will self administer. RT to monitor and assess as needed.

## 2014-01-24 NOTE — Progress Notes (Signed)
Name: [name] MRN: [id number] DOB: [birth year]    ADMISSION DATE:  01/17/2014 CONSULTATION DATE:  1/7  REFERRING MD :  [name]  CHIEF COMPLAINT:  Acute respiratory failure   BRIEF PATIENT DESCRIPTION:  49 year old AAM well known to our svc & f/b RB for chronic respiratory failure in setting of Sarcoidosis (Dx'd by tbBx 02), just discharged on 1/6 after being treated for asthmatic bronchitis flare in setting of RSV infection. Returned to the hospital on 1/7 w/ < 24 hr new c/o productive cough brown blood tinged sputum, fever > 101, and progressive respiratory distress. CXR c/w new RLL infiltrate superimposed on chronic scarring. Admitted to SDU w/ working dx of HCAP.   SIGNIFICANT EVENTS  1/6 just d/c after asthmatic-like flare in setting of RSV virus.  1/7 re-admitted w/ HCAP  01/20/14 - feels better 01/22/14: Says some better since admit and even in last 24h but nowhere close to baseline. Per RN who spoke to RT: resp patttern today is similar to last few days. C/o bruising in abdomen and forearm at IV and SQ heparin sites x since admit 01/17/2014; not worse. Maintained on 3L o2  .01/23/2014: Post rounds yesterday developed new onset acute abrupt redness left toes just after IV abx stopped. Started Keflex . Today new small blister between first and 2nd toe and worsening redness in left aling with redness right feet. Warm and tender. C/p pain that is severe without relief from morphine    SUBJECTIVE/OVERNIGHT/INTERVAL HX 01/24/14: Stil with ongoing redness and pain in feet, Now more apparent in knees as well. ID does not think this is cellulituis but probably related to sarcoid. Limited Autoimmune of ANA, RF, complement - negative. ESR 55, Cryoglobulin pending. CK, troponin normal  VITAL SIGNS: Temp:  [97.7 F (36.5 C)-98.8 F (37.1 C)] 97.7 F (36.5 C) (01/14 0545) Pulse Rate:  [91-98] 91 (01/14 0545) Resp:  [16-20] 16 (01/14 0545) BP: (121-150)/(94-102) 121/94 mmHg (01/14 0545) SpO2:   [96 %-100 %] 96 % (01/14 0905) [place] PHYSICAL EXAMINATION: General:  Chronically ill looking Neuro:  Awake, oriented and w/out focal def  HEENT:  [place] no JVD  Cardiovascular:  Tachy rrr  Lungs:  NO rhonchi,OR  wheeze improved . Paradoxical respiration when he sleeps Abdomen:  Firm, + bowel sounds . Protuberant abdomen with visceral obesity + Musculoskeletal:  Intact  Skin:  STABLE BRUISING + on injection sites in groin and forearms. HAs RED, WARM BILATERAL TOES with extension into distal dorsum. One small 0.5cm blister between left first and second toes  On 01/23/14 but improved 1/14/6. Redness more apparent in knees. OVerall niot much worse Ext: ++ edema  PULMONARY  Recent Labs Lab 01/20/14 0444  PHART 7.415  PCO2ART 48.0*  PO2ART 122.0*  HCO3 30.4*  TCO2 27.7  O2SAT 98.5    CBC  Recent Labs Lab 01/18/14 0552 01/23/14 0510  HGB 10.9* 10.7*  HCT 35.1* 35.4*  WBC 29.9* 14.8*  PLT 215 291    COAGULATION  Recent Labs Lab 01/23/14 0510  INR 0.97    CARDIAC    Recent Labs Lab 01/23/14 1524  TROPONINI <0.03   No results for input(s): PROBNP in the last 168 hours.   CHEMISTRY  Recent Labs Lab 01/18/14 0552 01/19/14 0931 01/22/14 0425 01/23/14 0510  NA 138 136 139 138  K 4.9 4.4 4.6 4.1  CL 98 99 100 98  CO2 32 31 32 34*  GLUCOSE 174* 163* 200* 148*  BUN _0 17  CREATININE 0.96 0.88 0.84 0.92  CALCIUM 8.7 8.8 9.3 9.1  MG 2.6*  --   --  1.9  PHOS 3.2  --   --  3.7   Estimated Creatinine Clearance: 103.7 mL/min (by C-G formula based on Cr of 0.92).   LIVER  Recent Labs Lab 01/23/14 0510  INR 0.97     INFECTIOUS  Recent Labs Lab 01/18/14 0202 01/18/14 0552 01/19/14 0330 01/23/14 1115  LATICACIDVEN 4.0*  --   --  2.2  PROCALCITON  --  2.22 1.68  --      ENDOCRINE CBG (last 3)   Recent Labs  01/23/14 1638 01/23/14 2111 01/24/14 0731  GLUCAP 192* 110* 82         IMAGING x48h Ct Chest Wo Contrast  01/22/2014    CLINICAL DATA:  Chronic respiratory failure. Sarcoidosis. Productive cough. Fever. Respiratory distress. Shortness of breath.  EXAM: CT CHEST WITHOUT CONTRAST  TECHNIQUE: Multidetector CT imaging of the chest was performed following the standard protocol without IV contrast.  COMPARISON:  Multiple exams, including 01/20/2014 and 10/19/2013  FINDINGS: Mediastinum/Nodes: Mediastinal and hilar adenopathy with scattered calcifications compatible with patient's known sarcoidosis. Index right lower paratracheal lymph node 1.3 cm in short axis on image 22 series 2, previously 1.4 cm. Hilar adenopathy likewise similar to prior, difficult to measure due to lack of IV contrast and surrounding opacities.  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 Peribronchovascular and paraseptal nodularity with scattered nodularity observed in both lungs. Perihilar airspace opacity with confluent nodularity, not this similar to prior exam. Central airway narrowing observed especially in the right upper lobe. Scattered volume loss in both lungs.  There is increased ground-glass opacity and increased secondary pulmonary lobular septal accentuation in the right lower lobe, for example comparing image 42 of series 5 of today' s exam with image 39 of series 3 of the 10/19/2013 exam. There is also some increased in nodularity in the affected regions. Appearance is suspicious for superimposed alveolitis/atypical infectious process.  Upper abdomen: Unremarkable  Musculoskeletal: Unremarkable  IMPRESSION: 1. Considerable mediastinal and hilar adenopathy with severe parenchymal manifestations of sarcoidosis. 2. Alveolitis with increased nodularity in the right lower lobe favoring atypical infectious process - this is new compared to prior chest CT of 10/19/2013.   Electronically Signed   By: Sherryl Barters M.D.   On: 01/22/2014 15:53     MICRO Results for orders placed or performed during the hospital encounter of 01/17/14    Culture, blood (routine x 2)     Status: None   Collection Time: 01/17/14  8:05 AM  Result Value Ref Range Status   Specimen Description BLOOD RIGHT ANTECUBITAL  Final   Special Requests BOTTLES DRAWN AEROBIC AND ANAEROBIC 5ML  Final   Culture   Final    NO GROWTH 5 DAYS Performed at [facility]    Report Status 01/23/2014 FINAL  Final  Blood culture (routine x 2)     Status: None   Collection Time: 01/17/14  8:30 AM  Result Value Ref Range Status   Specimen Description BLOOD LEFT FOREARM  Final   Special Requests BOTTLES DRAWN AEROBIC AND ANAEROBIC 3CC  Final   Culture   Final    NO GROWTH 5 DAYS Performed at [facility]    Report Status 01/23/2014 FINAL  Final  Culture, respiratory (tracheal aspirate)     Status: None   Collection Time: 01/18/14  8:20 AM  Result Value Ref Range Status  Specimen Description SPUTUM  Final   Special Requests NONE  Final   Gram Stain   Final    FEW WBC PRESENT,BOTH PMN AND MONONUCLEAR RARE SQUAMOUS EPITHELIAL CELLS PRESENT FEW GRAM NEGATIVE RODS RARE GRAM POSITIVE COCCI IN PAIRS IN CLUSTERS    Culture   Final    NORMAL OROPHARYNGEAL FLORA Performed at [facility]    Report Status 01/20/2014 FINAL  Final  Culture, expectorated sputum-assessment     Status: None   Collection Time: 01/18/14  8:20 AM  Result Value Ref Range Status   Specimen Description SPUTUM  Final   Special Requests NONE  Final   Sputum evaluation   Final    THIS SPECIMEN IS ACCEPTABLE. RESPIRATORY CULTURE REPORT TO FOLLOW.   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 But suspect onging associated acute diast dysfn that is mild  Plan:   CPAP QHS HCAP coverage  - vanc 01/17/14 -  01/22/14  - ,. zosyn 1/7>>>01/22/14  -  levaquin 01/22/13 >> 01/1314 (stopped due to C Diff risk )   - Keflex Po 01/22/14 (for HCAP and new cellulitis on 01/22/14) >>>  - Scheduled BDs (avoid atrovent given GI issues recently) Taper steroids over next several days - cut to 31m daily fro 660mon 01/22/2014 O2 as needed to maintain O2 saturation > 90% (currently needing 3L)  Diurese again  01/24/2014 - has edema,  (bnp high)    Abdominal discomfort> due to constipation and some bloating  - denies this on 01/22/14 and 01/24/2014   Plan: Avoid atrovent Bowel regimen e Careful w/ narcs Add mag citrate prn Decrease iron to qd (his home regimen)   SKIN ? ID A: [place] from tid heparin on ijnection sites since admit 01/17/2014 NEw possible cellulitis feet L > R since 01/22/14, worse 01/23/14 -    - ID more concerned this is relaed to sarcoid - acrocyanosis phenomenon - ? Also atypical erythema nodosum of sarcoid. No rhabdo   P daily lovenox instead of heparin ID consult Continue keflex for now per ID Check PCt, ANCA, SCL70, ACE level Start Potassium iodide for erythema nodosum [id number]   OVERALL A: Deconditioned P PT consult   NEURO A Feet pain ? Due to erythema nodosum P Opiouids prn Toradol x1 Potassium Iodide [id number]  GLOBAL Patient updated. Needs to stay a few more days. Might benefit from LTAC/ SNF rehab  Dr. MuraBrand MalesD., F.C.[facility] Pulmonary and Critical Care Medicine Staff Physician [facility] Pager: [phone] no answer or between  15:00h - 7:00h: call [phone]  01/24/2014 10:01 AM

## 2014-01-24 NOTE — Progress Notes (Signed)
Patient ID: [name], male   DOB: 10/04/1965, 49 y.o.   MRN: [id number]         [facility]    Date of Admission:  01/17/2014    Total days of antibiotics 8        Day 3 cephalexin          Principal Problem:   Discoloration of skin of toe Active Problems:   Abnormal bruising   Foot pain, bilateral   Acute on chronic respiratory failure with hypoxia   HCAP (healthcare-associated pneumonia)   . albuterol  2.5 mg Nebulization Q4H  . arformoterol  15 mcg Nebulization Q12H  . budesonide (PULMICORT) nebulizer solution  0.5 mg Nebulization Q12H  . calcium-vitamin D  1 tablet Oral q morning - 10a  . cephALEXin  500 mg Oral 4 times per day  . cholecalciferol  1,000 Units Oral q morning - 10a  . diltiazem  30 mg Oral 3 times per day  . enoxaparin (LOVENOX) injection  30 mg Subcutaneous Q24H  . famotidine  20 mg Oral BID  . ferrous sulfate  325 mg Oral Q breakfast  . fluticasone  2 spray Each Nare BID  . insulin aspart  0-9 Units Subcutaneous TID WC  . metoprolol tartrate  50 mg Oral BID  . multivitamin with minerals  1 tablet Oral q morning - 10a  . polyethylene glycol  17 g Oral Daily  . potassium iodide  200 mg Oral TID  . predniSONE  40 mg Oral Q breakfast  . senna-docusate  2 tablet Oral BID  . simethicone  160 mg Oral QID    Subjective: He states that the pain in his toes is severe and unchanged. He does not feel that he is on sufficient pain medication.  Review of Systems: Pertinent items are noted in HPI.  Past Medical History  Diagnosis Date  . Hypertension   . Sarcoidosis   . OSA on CPAP   . Chronic respiratory failure     home oxygen PRN  . Bronchitis   . Pneumonia   . COPD (chronic obstructive pulmonary disease)   . Neuromuscular disorder     History  Substance Use Topics  . Smoking status: Never Smoker   . Smokeless tobacco: Never Used  . Alcohol Use: Yes     Comment: social    Family History  Problem Relation Age of  Onset  . Diabetes Father    Allergies  Allergen Reactions  . Clindamycin/Lincomycin Anaphylaxis and Swelling    Made face and tongue swell    OBJECTIVE: Blood pressure 154/71, pulse 91, temperature 97.9 F (36.6 C), temperature source Oral, resp. rate 20, height 5\' 7"  (1.702 m), weight 197 lb 11.2 oz (89.676 kg), SpO2 100 %. General: He is sitting up in a chair talking on the phone Skin: No change in the dusky erythema of all 10 toes. He has pitting edema of his feet and legs to the level of the knees. He still has some slight redness over his health lobes as well as palmar erythema. there is no change in the extensive bruising on his arms and abdomen  Lab Results Lab Results  Component Value Date   WBC 14.8* 01/23/2014   HGB 10.7* 01/23/2014   HCT 35.4* 01/23/2014   MCV 87.8 01/23/2014   PLT 291 01/23/2014    Lab Results  Component Value Date   CREATININE 0.92 01/23/2014   BUN 17 01/23/2014   NA 138  01/23/2014   K 4.1 01/23/2014   CL 98 01/23/2014   CO2 34* 01/23/2014    Lab Results  Component Value Date   ALT 57* 01/17/2014   AST 72* 01/17/2014   ALKPHOS 91 01/17/2014   BILITOT 1.0 01/17/2014    SED RATE (mm/hr)  Date Value  01/23/2014 55*   CRP (mg/dL)  Date Value  96/04/5407 2.2*   Rheumatoid factor and ANA 01/23/2014: Negative C3 level 01/23/2014: Normal C4 level 01/23/2014: Elevated Cryoglobulin levels: Pending  Microbiology: Recent Results (from the past 240 hour(s))  Culture, blood (routine x 2)     Status: None   Collection Time: 01/17/14  8:05 AM  Result Value Ref Range Status   Specimen Description BLOOD RIGHT ANTECUBITAL  Final   Special Requests BOTTLES DRAWN AEROBIC AND ANAEROBIC  Final   Culture   Final    NO GROWTH 5 DAYS Performed at [facility]    Report Status 01/23/2014 FINAL  Final  Blood culture (routine x 2)     Status: None   Collection Time: 01/17/14  8:30 AM  Result Value Ref Range Status   Specimen Description  BLOOD LEFT FOREARM  Final   Special Requests BOTTLES DRAWN AEROBIC AND ANAEROBIC 3CC  Final   Culture   Final    NO GROWTH 5 DAYS Performed at [facility]    Report Status 01/23/2014 FINAL  Final  Culture, respiratory (tracheal aspirate)     Status: None   Collection Time: 01/18/14  8:20 AM  Result Value Ref Range Status   Specimen Description SPUTUM  Final   Special Requests NONE  Final   Gram Stain   Final    FEW WBC PRESENT,BOTH PMN AND MONONUCLEAR RARE SQUAMOUS EPITHELIAL CELLS PRESENT FEW GRAM NEGATIVE RODS RARE GRAM POSITIVE COCCI IN PAIRS IN CLUSTERS    Culture   Final    NORMAL OROPHARYNGEAL FLORA Performed at [facility]    Report Status 01/20/2014 FINAL  Final  Culture, expectorated sputum-assessment     Status: None   Collection Time: 01/18/14  8:20 AM  Result Value Ref Range Status   Specimen Description SPUTUM  Final   Special Requests NONE  Final   Sputum evaluation   Final    THIS SPECIMEN IS ACCEPTABLE. RESPIRATORY CULTURE REPORT TO FOLLOW.   Report Status 01/18/2014 FINAL  Final    Assessment: It is not yet clear what is causing his acrocyanosis and easy bruisability. I do not believe that he has cellulitis or other active infection.  Plan: 1. Discontinue cephalexin and observe off of antibiotics 2. Await results of cryoglobulin levels  Cliffton Asters, MD [facility] [phone] pager   [phone] cell 01/24/2014, 2:50 PM

## 2014-01-25 DIAGNOSIS — R238 Other skin changes: Secondary | ICD-10-CM

## 2014-01-25 LAB — GLUCOSE, CAPILLARY
GLUCOSE-CAPILLARY: 109 mg/dL — AB (ref 70–99)
GLUCOSE-CAPILLARY: 159 mg/dL — AB (ref 70–99)
GLUCOSE-CAPILLARY: 117 mg/dL — AB (ref 70–99)
Glucose-Capillary: 101 mg/dL — ABNORMAL HIGH (ref 70–99)
Glucose-Capillary: 90 mg/dL (ref 70–99)

## 2014-01-25 LAB — BRAIN NATRIURETIC PEPTIDE: B Natriuretic Peptide: 98.1 pg/mL (ref 0.0–100.0)

## 2014-01-25 LAB — BASIC METABOLIC PANEL
Anion gap: 11 (ref 5–15)
BUN: 23 mg/dL (ref 6–23)
CHLORIDE: 103 meq/L (ref 96–112)
CO2: 30 mmol/L (ref 19–32)
Calcium: 9.8 mg/dL (ref 8.4–10.5)
Creatinine, Ser: 1.27 mg/dL (ref 0.50–1.35)
GFR calc Af Amer: 75 mL/min — ABNORMAL LOW (ref >90)
GFR calc non Af Amer: 65 mL/min — ABNORMAL LOW (ref >90)
GLUCOSE: 115 mg/dL — AB (ref 70–99)
Potassium: 5.1 mmol/L (ref 3.5–5.1)
SODIUM: 144 mmol/L (ref 135–145)

## 2014-01-25 LAB — MPO/PR-3 (ANCA) ANTIBODIES: Myeloperoxidase Abs: <1

## 2014-01-25 LAB — ANCA SCREEN W REFLEX TITER
ATYPICAL P-ANCA SCREEN: NEGATIVE
P-ANCA SCREEN: NEGATIVE
c-ANCA Screen: NEGATIVE

## 2014-01-25 LAB — TROPONIN I

## 2014-01-25 LAB — COMPLEMENT, TOTAL: Compl, Total (CH50): >60 U/mL — ABNORMAL HIGH (ref 31–60)

## 2014-01-25 LAB — HIV ANTIBODY (ROUTINE TESTING W REFLEX)
HIV 1/HIV 2 AB: NONREACTIVE
HIV 1/O/2 Abs-Index Value: <1 (ref <1.00)

## 2014-01-25 LAB — ANTI-SCLERODERMA ANTIBODY: SCLERODERMA (SCL-70) (ENA) ANTIBODY, IGG: NEGATIVE

## 2014-01-25 LAB — CYCLIC CITRUL PEPTIDE ANTIBODY, IGG

## 2014-01-25 MED ORDER — PANTOPRAZOLE SODIUM 40 MG PO TBEC
40.0000 mg | DELAYED_RELEASE_TABLET | Freq: Every day | ORAL | Status: DC
  Administered 2014-01-25 – 2014-01-28 (×4): 40 mg via ORAL
  Filled 2014-01-25 (×4): qty 1

## 2014-01-25 MED ORDER — GI COCKTAIL ~~LOC~~
30.0000 mL | ORAL | Status: AC
  Administered 2014-01-25: 30 mL via ORAL
  Filled 2014-01-25: qty 30

## 2014-01-25 MED ORDER — POTASSIUM CHLORIDE CRYS ER 20 MEQ PO TBCR
40.0000 meq | EXTENDED_RELEASE_TABLET | Freq: Once | ORAL | Status: AC
  Administered 2014-01-25: 40 meq via ORAL
  Filled 2014-01-25: qty 2

## 2014-01-25 MED ORDER — GI COCKTAIL ~~LOC~~
30.0000 mL | Freq: Three times a day (TID) | ORAL | Status: DC | PRN
  Filled 2014-01-25: qty 30

## 2014-01-25 MED ORDER — FUROSEMIDE 10 MG/ML IJ SOLN
40.0000 mg | Freq: Three times a day (TID) | INTRAMUSCULAR | Status: DC
  Administered 2014-01-25 – 2014-01-26 (×3): 40 mg via INTRAVENOUS
  Filled 2014-01-25 (×7): qty 4

## 2014-01-25 NOTE — Progress Notes (Deleted)
Patient ID: [name], male   DOB: 06/10/1965, 49 y.o.   MRN: [id number]         [facility]    Date of Admission:  01/17/2014           Day 12 trimethoprim sulfamethoxazole  Principal Problem:   Discoloration of skin of toe Active Problems:   Abnormal bruising   Foot pain, bilateral   Acute on chronic respiratory failure with hypoxia   HCAP (healthcare-associated pneumonia)   . albuterol  2.5 mg Nebulization Q4H  . arformoterol  15 mcg Nebulization Q12H  . budesonide (PULMICORT) nebulizer solution  0.5 mg Nebulization Q12H  . calcium-vitamin D  1 tablet Oral q morning - 10a  . cholecalciferol  1,000 Units Oral q morning - 10a  . diltiazem  30 mg Oral 3 times per day  . famotidine  20 mg Oral BID  . ferrous sulfate  325 mg Oral Q breakfast  . fluticasone  2 spray Each Nare BID  . insulin aspart  0-9 Units Subcutaneous TID WC  . metoprolol tartrate  50 mg Oral BID  . multivitamin with minerals  1 tablet Oral q morning - 10a  . polyethylene glycol  17 g Oral Daily  . potassium iodide  200 mg Oral TID  . predniSONE  40 mg Oral Q breakfast  . senna-docusate  2 tablet Oral BID  . simethicone  160 mg Oral QID    Subjective: [name] states that his breathing is about "95% better". He states that he is bothered now by a migraine headache. He tells me that the psychiatrist stop by to see him early this morning but that he "ran him off". He says he was a crazy person than that he does not need to be talking about anything other than his medical issues.  Review of Systems: Pertinent items are noted in HPI.  Past Medical History  Diagnosis Date  . Hypertension   . Sarcoidosis   . OSA on CPAP   . Chronic respiratory failure     home oxygen PRN  . Bronchitis   . Pneumonia   . COPD (chronic obstructive pulmonary disease)   . Neuromuscular disorder     History  Substance Use Topics  . Smoking status: Never Smoker   . Smokeless tobacco: Never Used    . Alcohol Use: Yes     Comment: social    Family History  Problem Relation Age of Onset  . Diabetes Father    Allergies  Allergen Reactions  . Clindamycin/Lincomycin Anaphylaxis and Swelling    Made face and tongue swell    OBJECTIVE: Blood pressure 153/88, pulse 96, temperature 98.2 F (36.8 C), temperature source Oral, resp. rate 20, height 5\' 7"  (1.702 m), weight 197 lb 3.2 oz (89.449 kg), SpO2 100 %.  His oxygen saturation was have been greater than equal to 93% overnight on 6 L nasal prong oxygen (same level as he uses routinely at home)  General: His eyes are closed and he is holding his head in a darkened room Skin: No rash Lungs: Clear anteriorly  Cor: Regular S1 and S2 with no murmur   Lab Results Lab Results  Component Value Date   WBC 14.8* 01/23/2014   HGB 10.7* 01/23/2014   HCT 35.4* 01/23/2014   MCV 87.8 01/23/2014   PLT 291 01/23/2014    Lab Results  Component Value Date   CREATININE 1.27 01/25/2014   BUN 23 01/25/2014   NA  144 01/25/2014   K 5.1 01/25/2014   CL 103 01/25/2014   CO2 30 01/25/2014    Lab Results  Component Value Date   ALT 57* 01/17/2014   AST 72* 01/17/2014   ALKPHOS 91 01/17/2014   BILITOT 1.0 01/17/2014     Microbiology: Recent Results (from the past 240 hour(s))  Culture, blood (routine x 2)     Status: None   Collection Time: 01/17/14  8:05 AM  Result Value Ref Range Status   Specimen Description BLOOD RIGHT ANTECUBITAL  Final   Special Requests BOTTLES DRAWN AEROBIC AND ANAEROBIC  Final   Culture   Final    NO GROWTH 5 DAYS Performed at [facility]    Report Status 01/23/2014 FINAL  Final  Blood culture (routine x 2)     Status: None   Collection Time: 01/17/14  8:30 AM  Result Value Ref Range Status   Specimen Description BLOOD LEFT FOREARM  Final   Special Requests BOTTLES DRAWN AEROBIC AND ANAEROBIC 3CC  Final   Culture   Final    NO GROWTH 5 DAYS Performed at [facility]    Report  Status 01/23/2014 FINAL  Final  Culture, respiratory (tracheal aspirate)     Status: None   Collection Time: 01/18/14  8:20 AM  Result Value Ref Range Status   Specimen Description SPUTUM  Final   Special Requests NONE  Final   Gram Stain   Final    FEW WBC PRESENT,BOTH PMN AND MONONUCLEAR RARE SQUAMOUS EPITHELIAL CELLS PRESENT FEW GRAM NEGATIVE RODS RARE GRAM POSITIVE COCCI IN PAIRS IN CLUSTERS    Culture   Final    NORMAL OROPHARYNGEAL FLORA Performed at [facility]    Report Status 01/20/2014 FINAL  Final  Culture, expectorated sputum-assessment     Status: None   Collection Time: 01/18/14  8:20 AM  Result Value Ref Range Status   Specimen Description SPUTUM  Final   Special Requests NONE  Final   Sputum evaluation   Final    THIS SPECIMEN IS ACCEPTABLE. RESPIRATORY CULTURE REPORT TO FOLLOW.   Report Status 01/18/2014 FINAL  Final    Assessment: His pneumonia has improved. His chest x-ray 3 days ago looked pretty close to his baseline related to his sarcoidosis. I will continue full dose trimethoprim sulfamethoxazole for 9 more days then decrease to one double strength tablet daily and cut his prednisone back to his baseline 10 mg daily dose. When he is ready for discharge I would change his 3 drug antiretroviral regimen to Triumeq 1 daily.  Plan: 1. Continue full dose trimethoprim sulfamethoxazole for 9 more days 2. I will follow-up on Monday, 01/28/2014  Cliffton Asters, MD [facility] for Infect[facility] Disease [facility] [phone] pager   [phone] cell 01/25/2014, 8:40 AM

## 2014-01-25 NOTE — Progress Notes (Signed)
Name: [name] MRN: [id number] DOB: Jul 12, 1965    ADMISSION DATE:  01/17/2014 CONSULTATION DATE:  1/7  REFERRING MD :  [name]  CHIEF COMPLAINT:  Acute respiratory failure   BRIEF PATIENT DESCRIPTION:  49 year old AAM well known to our svc & f/b RB for chronic respiratory failure in setting of Sarcoidosis (Dx'd by tbBx 02), just discharged on 1/6 after being treated for asthmatic bronchitis flare in setting of RSV infection. Returned to the hospital on 1/7 w/ < 24 hr new c/o productive cough brown blood tinged sputum, fever > 101, and progressive respiratory distress. CXR c/w new RLL infiltrate superimposed on chronic scarring. Admitted to SDU w/ working dx of HCAP.    ANTIBIOTICS HCAP coverage  - vanc 01/17/14 - 01/22/14  - ,. zosyn 1/7>>>01/22/14  -  levaquin 01/22/13 >> 01/1314 (stopped due to C Diff risk )   - Keflex Po 01/22/14 (for HCAP and new cellulitis on 01/22/14) >>>  SIGNIFICANT EVENTS  1/6 just d/c after asthmatic-like flare in setting of RSV virus.  1/7 re-admitted w/ HCAP  01/20/14 - feels better 01/22/14: Says some better since admit and even in last 24h but nowhere close to baseline. Per RN who spoke to RT: resp patttern today is similar to last few days. C/o bruising in abdomen and forearm at IV and SQ heparin sites x since admit 01/17/2014; not worse. Maintained on 3L o2  .01/23/2014: Post rounds yesterday developed new onset acute abrupt redness left toes just after IV abx stopped. Started Keflex . Today new small blister between first and 2nd toe and worsening redness in left aling with redness right feet. Warm and tender. C/p pain that is severe without relief from morphine  01/24/14: Stil with ongoing redness and pain in feet, Now more apparent in knees as well. ID does not think this is cellulituis but probably related to sarcoid. Limited Autoimmune of ANA, RF, complement - negative. ESR 55, Cryoglobulin pending. CK, troponin normal    SUBJECTIVE/OVERNIGHT/INTERVAL  HX 01/25/14: OFf antibiotics since yesterday. SSKI started yesterday for presmumed erythema nodosum - significant improvemt in pain and redness today but c/p heart burn and wheeze. Has 3+ edema. Frustrated by his longer hospital stay  VITAL SIGNS: Temp:  [97.9 F (36.6 C)-98.2 F (36.8 C)] 98.2 F (36.8 C) (01/15 0546) Pulse Rate:  [91-112] 96 (01/15 0546) Resp:  [18-20] 20 (01/15 0546) BP: (153-159)/(71-95) 153/88 mmHg (01/15 0546) SpO2:  [97 %-100 %] 100 % (01/15 0808) Weight:  [89.177 kg (196 lb 9.6 oz)-89.449 kg (197 lb 3.2 oz)] 89.449 kg (197 lb 3.2 oz) (01/15 0546) [place] PHYSICAL EXAMINATION: General:  Chronically ill looking Neuro:  Awake, oriented and w/out focal def  HEENT:  [place] no JVD  Cardiovascular:  Tachy rrr  Lungs:  N. Paradoxical respiration when he sleeps. Significant wheeze + Abdomen:  Firm, + bowel sounds . Protuberant abdomen with visceral obesity + Musculoskeletal:  3+ edema Skin:  STABLE BRUISING + on injection sites in groin and forearms. HAs RED,BILATERAL TOES with extension into distal dorsum. - hugely beter 01/25/2014. NOt warm anymore  PULMONARY  Recent Labs Lab 01/20/14 0444  PHART 7.415  PCO2ART 48.0*  PO2ART 122.0*  HCO3 30.4*  TCO2 27.7  O2SAT 98.5    CBC  Recent Labs Lab 01/23/14 0510  HGB 10.7*  HCT 35.4*  WBC 14.8*  PLT 291    COAGULATION  Recent Labs Lab 01/23/14 0510  INR 0.97    CARDIAC    Recent Labs  Lab 01/23/14 1524  TROPONINI <0.03   No results for input(s): PROBNP in the last 168 hours.   CHEMISTRY  Recent Labs Lab 01/19/14 0931 01/22/14 0425 01/23/14 0510 01/25/14 0425  NA 136 139 138 144  K 4.4 4.6 4.1 5.1  CL 99 100 98 103  CO2 31 32 34* 30  GLUCOSE 163* 200* 148* 115*  BUN 22 21 17 23   CREATININE 0.88 0.84 0.92 1.27  CALCIUM 8.8 9.3 9.1 9.8  MG  --   --  1.9  --   PHOS  --   --  3.7  --    Estimated Creatinine Clearance: 75 mL/min (by C-G formula based on Cr of 1.27).   LIVER  Recent  Labs Lab 01/23/14 0510  INR 0.97     INFECTIOUS  Recent Labs Lab 01/19/14 0330 01/23/14 1115 01/24/14 1025  LATICACIDVEN  --  2.2  --   PROCALCITON 1.68  --  <0.10     ENDOCRINE CBG (last 3)   Recent Labs  01/24/14 1713 01/24/14 2211 01/25/14 0717  GLUCAP 178* 124* 101*         IMAGING x48h No results found.         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 But suspect onging associated acute diast dysfn that is mild - 01/25/14: Mope wheeze and edema worse.    Plan:  Start aggressive scheduled lasix 01/25/2014  CPAP QHS Monitor off antibiotics  Scheduled BDs (avoid atrovent given GI issues recently) Po Steroids to continue ( 32m daily fro 669mon 01/22/2014); aim to drop 01/26/14 dependingon wheeze O2 as needed to maintain O2 saturation > 90% (currently needing 3L)  Diurese again  01/25/2014 -aggressive   Abdominal discomfort> due to constipation and some bloating  - denies this on 01/22/14 to 01/24/14 but on  01/25/2014 having heart burn   Plan: CHECK TROPONIN Avoid atrovent Bowel regimen e Careful w/ narcs iron at qd (his home regimen) Continue H2 blockade Add PPI Give GI cocktail x 1    SKIN  - ? 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 Due to erythema nodosum  - improved 01/25/2014 with KI P Opiouids prn Toradol x1 Potassium Iodide 200m58md  GLOBAL Patient updated. Needs to stay a few more days. Might benefit from LTAC/ SNF rehab     Dr. MuraBrand MalesD., F.C.[facility] Medicine Staff Physician [facility] Pager: [phone] no answer or between  15:00h - 7:00h: call [phone]  01/25/2014 10:06 AM

## 2014-01-25 NOTE — Progress Notes (Signed)
Patient ID: [name], male   DOB: 12/24/1965, 49 y.o.   MRN: [id number]         [facility]    Date of Admission:  01/17/2014    Off antibiotics          Principal Problem:   Discoloration of skin of toe Active Problems:   Abnormal bruising   Foot pain, bilateral   Acute on chronic respiratory failure with hypoxia   HCAP (healthcare-associated pneumonia)   . albuterol  2.5 mg Nebulization Q4H  . arformoterol  15 mcg Nebulization Q12H  . budesonide (PULMICORT) nebulizer solution  0.5 mg Nebulization Q12H  . calcium-vitamin D  1 tablet Oral q morning - 10a  . cholecalciferol  1,000 Units Oral q morning - 10a  . diltiazem  30 mg Oral 3 times per day  . famotidine  20 mg Oral BID  . ferrous sulfate  325 mg Oral Q breakfast  . fluticasone  2 spray Each Nare BID  . insulin aspart  0-9 Units Subcutaneous TID WC  . metoprolol tartrate  50 mg Oral BID  . multivitamin with minerals  1 tablet Oral q morning - 10a  . polyethylene glycol  17 g Oral Daily  . potassium iodide  200 mg Oral TID  . predniSONE  40 mg Oral Q breakfast  . senna-docusate  2 tablet Oral BID  . simethicone  160 mg Oral QID    Subjective: He is still having pain in his legs but is feeling better. He is asking if he can go home today.  Review of Systems: Pertinent items are noted in HPI.  Past Medical History  Diagnosis Date  . Hypertension   . Sarcoidosis   . OSA on CPAP   . Chronic respiratory failure     home oxygen PRN  . Bronchitis   . Pneumonia   . COPD (chronic obstructive pulmonary disease)   . Neuromuscular disorder     History  Substance Use Topics  . Smoking status: Never Smoker   . Smokeless tobacco: Never Used  . Alcohol Use: Yes     Comment: social    Family History  Problem Relation Age of Onset  . Diabetes Father    Allergies  Allergen Reactions  . Clindamycin/Lincomycin Anaphylaxis and Swelling    Made face and tongue swell     OBJECTIVE: Blood pressure 153/88, pulse 96, temperature 98.2 F (36.8 C), temperature source Oral, resp. rate 20, height 5\' 7"  (1.702 m), weight 197 lb 3.2 oz (89.449 kg), SpO2 100 %. General: He is sitting up in a chair  Skin: The erythema of his toes has improved significantly overnight.still  He has pitting edema of his feet and legs to the level of the knees. He still has some slight redness over his elbows as well as palmar erythema. There is no change in the extensive bruising on his arms and abdomen  Lab Results Lab Results  Component Value Date   WBC 14.8* 01/23/2014   HGB 10.7* 01/23/2014   HCT 35.4* 01/23/2014   MCV 87.8 01/23/2014   PLT 291 01/23/2014    Lab Results  Component Value Date   CREATININE 1.27 01/25/2014   BUN 23 01/25/2014   NA 144 01/25/2014   K 5.1 01/25/2014   CL 103 01/25/2014   CO2 30 01/25/2014    Lab Results  Component Value Date   ALT 57* 01/17/2014   AST 72* 01/17/2014   ALKPHOS 91 01/17/2014  BILITOT 1.0 01/17/2014    SED RATE (mm/hr)  Date Value  01/23/2014 55*   CRP (mg/dL)  Date Value  46/96/2952 2.2*   Rheumatoid factor and ANA 01/23/2014: Negative C3 level 01/23/2014: Normal C4 level 01/23/2014: Elevated Cryoglobulin levels: Pending  Microbiology: Recent Results (from the past 240 hour(s))  Culture, blood (routine x 2)     Status: None   Collection Time: 01/17/14  8:05 AM  Result Value Ref Range Status   Specimen Description BLOOD RIGHT ANTECUBITAL  Final   Special Requests BOTTLES DRAWN AEROBIC AND ANAEROBIC  Final   Culture   Final    NO GROWTH 5 DAYS Performed at [facility]    Report Status 01/23/2014 FINAL  Final  Blood culture (routine x 2)     Status: None   Collection Time: 01/17/14  8:30 AM  Result Value Ref Range Status   Specimen Description BLOOD LEFT FOREARM  Final   Special Requests BOTTLES DRAWN AEROBIC AND ANAEROBIC 3CC  Final   Culture   Final    NO GROWTH 5 DAYS Performed at  [facility]    Report Status 01/23/2014 FINAL  Final  Culture, respiratory (tracheal aspirate)     Status: None   Collection Time: 01/18/14  8:20 AM  Result Value Ref Range Status   Specimen Description SPUTUM  Final   Special Requests NONE  Final   Gram Stain   Final    FEW WBC PRESENT,BOTH PMN AND MONONUCLEAR RARE SQUAMOUS EPITHELIAL CELLS PRESENT FEW GRAM NEGATIVE RODS RARE GRAM POSITIVE COCCI IN PAIRS IN CLUSTERS    Culture   Final    NORMAL OROPHARYNGEAL FLORA Performed at [facility]    Report Status 01/20/2014 FINAL  Final  Culture, expectorated sputum-assessment     Status: None   Collection Time: 01/18/14  8:20 AM  Result Value Ref Range Status   Specimen Description SPUTUM  Final   Special Requests NONE  Final   Sputum evaluation   Final    THIS SPECIMEN IS ACCEPTABLE. RESPIRATORY CULTURE REPORT TO FOLLOW.   Report Status 01/18/2014 FINAL  Final    Assessment: It is not yet clear what is causing his acrocyanosis and easy bruisability but he is showing signs of improvement. I do not think that he has cellulitis.  Plan: 1. Continue observation off of antibiotics 2. I will sign off now  Cliffton Asters, MD [facility] for Infectious Disease [facility] [phone] pager   [phone] cell 01/25/2014, 8:07 AM

## 2014-01-25 NOTE — Progress Notes (Signed)
Patient states he will self administer CPAP when ready. Water chamber has sterile water for humidification. RT checked CPAP pressure ( 10 cmH2O). RT will continue to assess and monitor as needed.

## 2014-01-26 DIAGNOSIS — G629 Polyneuropathy, unspecified: Secondary | ICD-10-CM | POA: Insufficient documentation

## 2014-01-26 LAB — BASIC METABOLIC PANEL
ANION GAP: 9 (ref 5–15)
BUN: 22 mg/dL (ref 6–23)
CO2: 36 mmol/L — AB (ref 19–32)
CREATININE: 1.11 mg/dL (ref 0.50–1.35)
Calcium: 10.2 mg/dL (ref 8.4–10.5)
Chloride: 96 mEq/L (ref 96–112)
GFR calc Af Amer: 88 mL/min — ABNORMAL LOW (ref >90)
GFR, EST NON AFRICAN AMERICAN: 76 mL/min — AB (ref >90)
Glucose, Bld: 94 mg/dL (ref 70–99)
Potassium: 4 mmol/L (ref 3.5–5.1)
Sodium: 141 mmol/L (ref 135–145)

## 2014-01-26 LAB — GLUCOSE, CAPILLARY
GLUCOSE-CAPILLARY: 199 mg/dL — AB (ref 70–99)
Glucose-Capillary: 208 mg/dL — ABNORMAL HIGH (ref 70–99)
Glucose-Capillary: 181 mg/dL — ABNORMAL HIGH (ref 70–99)
Glucose-Capillary: 111 mg/dL — ABNORMAL HIGH (ref 70–99)

## 2014-01-26 LAB — TROPONIN I: Troponin I: 0.03 ng/mL (ref <0.031)

## 2014-01-26 LAB — PROCALCITONIN: Procalcitonin: <0.1 ng/mL

## 2014-01-26 MED ORDER — HYDROCODONE-ACETAMINOPHEN 10-325 MG PO TABS
1.0000 | ORAL_TABLET | ORAL | Status: DC | PRN
  Administered 2014-01-26 – 2014-01-28 (×4): 1 via ORAL
  Filled 2014-01-26 (×4): qty 1

## 2014-01-26 MED ORDER — FUROSEMIDE 10 MG/ML IJ SOLN
80.0000 mg | Freq: Three times a day (TID) | INTRAMUSCULAR | Status: DC
  Administered 2014-01-26 – 2014-01-28 (×5): 80 mg via INTRAVENOUS
  Filled 2014-01-26 (×9): qty 8

## 2014-01-26 MED ORDER — SPIRONOLACTONE 25 MG PO TABS
25.0000 mg | ORAL_TABLET | Freq: Two times a day (BID) | ORAL | Status: DC
  Administered 2014-01-26 – 2014-01-28 (×5): 25 mg via ORAL
  Filled 2014-01-26 (×7): qty 1

## 2014-01-26 MED ORDER — DM-GUAIFENESIN ER 30-600 MG PO TB12
2.0000 | ORAL_TABLET | Freq: Two times a day (BID) | ORAL | Status: DC
  Administered 2014-01-26 – 2014-01-28 (×4): 2 via ORAL
  Filled 2014-01-26 (×5): qty 2

## 2014-01-26 NOTE — Progress Notes (Signed)
Name: [name] MRN: [id number] DOB: [birth year]    ADMISSION DATE:  01/17/2014 CONSULTATION DATE:  1/7  REFERRING MD :  [name]  CHIEF COMPLAINT:  Acute respiratory failure   BRIEF PATIENT DESCRIPTION:  49 year old AAM well known to our svc & f/b RB for chronic respiratory failure in setting of Sarcoidosis (Dx'd by tbBx 02), just discharged on 1/6 after being treated for asthmatic bronchitis flare in setting of RSV infection. Returned to the hospital on 1/7 w/ < 24 hr new c/o productive cough brown blood tinged sputum, fever > 101, and progressive respiratory distress. CXR c/w new RLL infiltrate superimposed on chronic scarring. Admitted to SDU w/ working dx of HCAP.    ANTIBIOTICS HCAP coverage  - vanc 01/17/14 - 01/22/14  - ,. zosyn 1/7>>>01/22/14  -  levaquin 01/22/13 >> 01/1314 (stopped due to C Diff risk )   - Keflex Po 01/22/14 (for HCAP and new cellulitis on 01/22/14) >>>  SIGNIFICANT EVENTS  1/6 just d/c after asthmatic-like flare in setting of RSV virus.  1/7 re-admitted w/ HCAP  01/20/14 - feels better 01/22/14: Says some better since admit and even in last 24h but nowhere close to baseline. Per RN who spoke to RT: resp patttern today is similar to last few days. C/o bruising in abdomen and forearm at IV and SQ heparin sites x since admit 01/17/2014; not worse. Maintained on 3L o2  .01/23/2014: Post rounds yesterday developed new onset acute abrupt redness left toes just after IV abx stopped. Started Keflex . Today new small blister between first and 2nd toe and worsening redness in left aling with redness right feet. Warm and tender. C/p pain that is severe without relief from morphine  01/24/14: Stil with ongoing redness and pain in feet, Now more apparent in knees as well. ID does not think this is cellulituis but probably related to sarcoid. Limited Autoimmune of ANA, RF, complement - negative. ESR 55, Cryoglobulin pending. CK, troponin normal    SUBJECTIVE/OVERNIGHT/INTERVAL  HX Still dyspneic and c/o pain in LE.  VITAL SIGNS: Temp:  [97.6 F (36.4 C)-98.8 F (37.1 C)] 97.6 F (36.4 C) (01/16 0514) Pulse Rate:  [89-98] 98 (01/16 0514) Resp:  [20] 20 (01/16 0514) BP: (127-157)/(85-107) 127/85 mmHg (01/16 0514) SpO2:  [92 %-100 %] 100 % (01/16 0743) Weight:  [89.54 kg (197 lb 6.4 oz)] 89.54 kg (197 lb 6.4 oz) (01/16 0514) [place]  PHYSICAL EXAMINATION: General:  Chronically ill looking Neuro:  Awake, oriented and w/out focal def  HEENT:  [place] no JVD  Cardiovascular:  Tachy rrr   3+ edema Lungs:  N. Paradoxical respiration when he sleeps. Significant wheeze + Abdomen:  Firm, + bowel sounds . Protuberant abdomen with visceral obesity + Musculoskeletal:  3+ edema Skin:  Recurrent redness in LE no lesions seen  PULMONARY  Recent Labs Lab 01/20/14 0444  PHART 7.415  PCO2ART 48.0*  PO2ART 122.0*  HCO3 30.4*  TCO2 27.7  O2SAT 98.5    CBC  Recent Labs Lab 01/23/14 0510  HGB 10.7*  HCT 35.4*  WBC 14.8*  PLT 291    COAGULATION  Recent Labs Lab 01/23/14 0510  INR 0.97    CARDIAC    Recent Labs Lab 01/23/14 1524 01/25/14 1100 01/26/14 0514  TROPONINI <0.03 <0.03 0.03   No results for input(s): PROBNP in the last 168 hours.   CHEMISTRY  Recent Labs Lab 01/19/14 0931 01/22/14 0425 01/23/14 0510 01/25/14 0425 01/26/14 0514  NA 136 139 138 144  141  K 4.4 4.6 4.1 5.1 4.0  CL 99 100 98 103 96  CO2 31 32 34* 30 36*  GLUCOSE 163* 200* 148* 115* 94  BUN 22 21 17 23 22   CREATININE 0.88 0.84 0.92 1.27 1.11  CALCIUM 8.8 9.3 9.1 9.8 10.2  MG  --   --  1.9  --   --   PHOS  --   --  3.7  --   --    Estimated Creatinine Clearance: 86 mL/min (by C-G formula based on Cr of 1.11).   LIVER  Recent Labs Lab 01/23/14 0510  INR 0.97     INFECTIOUS  Recent Labs Lab 01/23/14 1115 01/24/14 1025 01/26/14 0514  LATICACIDVEN 2.2  --   --   PROCALCITON  --  <0.10 <0.10     ENDOCRINE CBG (last 3)   Recent Labs   01/25/14 1708 01/25/14 2155 01/26/14 0742  GLUCAP 159* 117* 208*   IMAGING x48h No results found.   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 - OSA - on CPAP   Current -  s/p  flare from RSV  And bounce back admit t 01/17/2014 - for HCAP Right lower lobe,, sp Rx ending 01/24/14  Plan:  Increase lasix , add aldactone   CPAP QHS Monitor off antibiotics  Scheduled BDs (avoid atrovent given GI issues recently) Po Steroids to continue  O2 as needed to maintain O2 saturation > 90% (currently needing 3L)    Abdominal discomfort> due to constipation and some bloating  - denies this on 01/22/14 to 01/24/14 but on  01/25/2014 having heart burn   Plan: Avoid atrovent Bowel regimen e Careful w/ narcs iron at qd (his home regimen) Continue H2 blockade cont PPI     SKIN  - this does not look like E nodosum. This is generalized erthema from pedal edema and pre tibial edema A:  - keflex stopped 01/24/14  -resume LMWH, note no DVT on 01/22/14 study AWAIT , ANCA, SCL70, \ D/c KI, not sure this is helping    OVERALL A: Deconditioned P PT consult   NEURO A Feet pain , suspect more d/t neuropathy  . Persists despite KI  P Opiouids prn D/c KI  GLOBAL Patient updated. Not ready for d/c .  High readmission risk     Glyn Ade  [phone]  Cell  [phone]  If no response or cell goes to voicemail, call beeper [phone]   01/26/2014 8:37 AM

## 2014-01-27 DIAGNOSIS — J40 Bronchitis, not specified as acute or chronic: Secondary | ICD-10-CM

## 2014-01-27 LAB — CBC WITH DIFFERENTIAL/PLATELET
Basophils Absolute: 0 10*3/uL (ref 0.0–0.1)
Basophils Relative: 0 % (ref 0–1)
Eosinophils Absolute: 0 10*3/uL (ref 0.0–0.7)
Eosinophils Relative: 0 % (ref 0–5)
HCT: 38.1 % — ABNORMAL LOW (ref 39.0–52.0)
Hemoglobin: 11.4 g/dL — ABNORMAL LOW (ref 13.0–17.0)
Lymphocytes Relative: 7 % — ABNORMAL LOW (ref 12–46)
Lymphs Abs: 1 10*3/uL (ref 0.7–4.0)
MCH: 27 pg (ref 26.0–34.0)
MCHC: 29.9 g/dL — ABNORMAL LOW (ref 30.0–36.0)
MCV: 90.1 fL (ref 78.0–100.0)
Monocytes Absolute: 0.7 10*3/uL (ref 0.1–1.0)
Monocytes Relative: 5 % (ref 3–12)
NEUTROS ABS: 12.4 10*3/uL — AB (ref 1.7–7.7)
NEUTROS PCT: 88 % — AB (ref 43–77)
Platelets: 297 10*3/uL (ref 150–400)
RBC: 4.23 MIL/uL (ref 4.22–5.81)
RDW: 15.1 % (ref 11.5–15.5)
WBC: 14.2 10*3/uL — AB (ref 4.0–10.5)

## 2014-01-27 LAB — GLUCOSE, CAPILLARY
GLUCOSE-CAPILLARY: 126 mg/dL — AB (ref 70–99)
GLUCOSE-CAPILLARY: 192 mg/dL — AB (ref 70–99)
Glucose-Capillary: 104 mg/dL — ABNORMAL HIGH (ref 70–99)
Glucose-Capillary: 129 mg/dL — ABNORMAL HIGH (ref 70–99)

## 2014-01-27 LAB — BASIC METABOLIC PANEL
ANION GAP: 15 (ref 5–15)
BUN: 28 mg/dL — ABNORMAL HIGH (ref 6–23)
CALCIUM: 9.9 mg/dL (ref 8.4–10.5)
CO2: 36 mmol/L — AB (ref 19–32)
CREATININE: 0.96 mg/dL (ref 0.50–1.35)
Chloride: 90 mEq/L — ABNORMAL LOW (ref 96–112)
GFR calc non Af Amer: >90 mL/min (ref >90)
Glucose, Bld: 151 mg/dL — ABNORMAL HIGH (ref 70–99)
POTASSIUM: 4.2 mmol/L (ref 3.5–5.1)
Sodium: 141 mmol/L (ref 135–145)

## 2014-01-27 MED ORDER — MENTHOL 3 MG MT LOZG
1.0000 | LOZENGE | Freq: Once | OROMUCOSAL | Status: AC
  Administered 2014-01-27: 3 mg via ORAL
  Filled 2014-01-27: qty 9

## 2014-01-27 MED ORDER — DILTIAZEM HCL ER COATED BEADS 120 MG PO CP24
120.0000 mg | ORAL_CAPSULE | Freq: Every day | ORAL | Status: DC
  Administered 2014-01-27 – 2014-01-28 (×2): 120 mg via ORAL
  Filled 2014-01-27 (×2): qty 1

## 2014-01-27 NOTE — Progress Notes (Signed)
Pt states that he will self administer CPAP when ready for bed.  Current settings are 10 CMH20 via home FFM.  Pt to call if needs any assistance.  RT to monitor and assess as needed.

## 2014-01-27 NOTE — Progress Notes (Addendum)
Name: [name] MRN: [id number] DOB: [birth year]    ADMISSION DATE:  01/17/2014 CONSULTATION DATE:  1/7  REFERRING MD :  [name]  CHIEF COMPLAINT:  Acute respiratory failure   BRIEF PATIENT DESCRIPTION:  49 year old AAM well known to our svc & f/b RB for chronic respiratory failure in setting of Sarcoidosis (Dx'd by tbBx 02), just discharged on 1/6 after being treated for asthmatic bronchitis flare in setting of RSV infection. Returned to the hospital on 1/7 w/ < 24 hr new c/o productive cough brown blood tinged sputum, fever > 101, and progressive respiratory distress. CXR c/w new RLL infiltrate superimposed on chronic scarring. Admitted to SDU w/ working dx of HCAP.    ANTIBIOTICS HCAP coverage  - vanc 01/17/14 - 01/22/14  - ,. zosyn 1/7>>>01/22/14  -  levaquin 01/22/13 >> 01/1314 (stopped due to C Diff risk )   - Keflex Po 01/22/14 (for HCAP and new cellulitis on 01/22/14) >>>  SIGNIFICANT EVENTS  1/6 just d/c after asthmatic-like flare in setting of RSV virus.  1/7 re-admitted w/ HCAP  01/20/14 - feels better 01/22/14: Says some better since admit and even in last 24h but nowhere close to baseline. Per RN who spoke to RT: resp patttern today is similar to last few days. C/o bruising in abdomen and forearm at IV and SQ heparin sites x since admit 01/17/2014; not worse. Maintained on 3L o2  .01/23/2014: Post rounds yesterday developed new onset acute abrupt redness left toes just after IV abx stopped. Started Keflex . Today new small blister between first and 2nd toe and worsening redness in left aling with redness right feet. Warm and tender. C/p pain that is severe without relief from morphine  01/24/14: Stil with ongoing redness and pain in feet, Now more apparent in knees as well. ID does not think this is cellulituis but probably related to sarcoid. Limited Autoimmune of ANA, RF, complement - negative. ESR 55, Cryoglobulin pending. CK, troponin normal    SUBJECTIVE/OVERNIGHT/INTERVAL  HX Diuresed well 01/26/14.  Still flushed. On lyrica and pt states he does NOT take this at home  VITAL SIGNS: Temp:  [97.9 F (36.6 C)-98.4 F (36.9 C)] 97.9 F (36.6 C) (01/17 0518) Pulse Rate:  [89-105] 99 (01/17 0628) Resp:  [18-20] 18 (01/17 0518) BP: (139-145)/(79-98) 142/98 mmHg (01/17 0721) SpO2:  [97 %-100 %] 100 % (01/17 0736) Weight:  [87.454 kg (192 lb 12.8 oz)] 87.454 kg (192 lb 12.8 oz) (01/17 0518) [place]  PHYSICAL EXAMINATION: General:  Chronically ill looking Neuro:  Awake, oriented and w/out focal def  HEENT:  [place] no JVD  Cardiovascular:  Tachy rrr   2+ edema Lungs:  N. Paradoxical respiration when he sleeps. Significant wheeze + Abdomen:  Firm, + bowel sounds . Protuberant abdomen with visceral obesity + Musculoskeletal: no joint deformity Skin:  Recurrent redness in LE no lesions seen  PULMONARY No results for input(s): PHART, PCO2ART, PO2ART, HCO3, TCO2, O2SAT in the last 168 hours.  Invalid input(s): PCO2, PO2  CBC  Recent Labs Lab 01/23/14 0510 01/27/14 0508  HGB 10.7* 11.4*  HCT 35.4* 38.1*  WBC 14.8* 14.2*  PLT 291 297    COAGULATION  Recent Labs Lab 01/23/14 0510  INR 0.97    CARDIAC    Recent Labs Lab 01/23/14 1524 01/25/14 1100 01/26/14 0514  TROPONINI <0.03 <0.03 0.03   No results for input(s): PROBNP in the last 168 hours.   CHEMISTRY  Recent Labs Lab 01/22/14 0425 01/23/14 0510 01/25/14  0425 01/26/14 0514 01/27/14 0508  NA 139 138 144 141 141  K 4.6 4.1 5.1 4.0 4.2  CL 100 98 103 96 90*  CO2 32 34* 30 36* 36*  GLUCOSE 200* 148* 115* 94 151*  BUN 21 17 23 22  28*  CREATININE 0.84 0.92 1.27 1.11 0.96  CALCIUM 9.3 9.1 9.8 10.2 9.9  MG  --  1.9  --   --   --   PHOS  --  3.7  --   --   --    Estimated Creatinine Clearance: 98.3 mL/min (by C-G formula based on Cr of 0.96).   LIVER  Recent Labs Lab 01/23/14 0510  INR 0.97     INFECTIOUS  Recent Labs Lab 01/23/14 1115 01/24/14 1025 01/26/14 0514   LATICACIDVEN 2.2  --   --   PROCALCITON  --  <0.10 <0.10     ENDOCRINE CBG (last 3)   Recent Labs  01/26/14 1723 01/26/14 2135 01/27/14 0742  GLUCAP 199* 111* 104*   IMAGING x48h No results found.   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 - OSA - on CPAP   Current -  s/p  flare from RSV  And bounce back admit t 01/17/2014 - for HCAP Right lower lobe,, sp Rx ending 01/24/14  Plan:  Cont IV Increased lasix , and aldactone   CPAP QHS Monitor off antibiotics  Scheduled BDs (avoid atrovent given GI issues recently) Po Steroids to continue, slow taper   O2 as needed to maintain O2 saturation > 90% (currently needing 3L)  d/c lyrica: probably contributing to fluid retention/erythema  Abdominal discomfort> due to constipation and some bloating  - denies this on 01/22/14 to 01/24/14 but on  01/25/2014 having heart burn   Plan: Avoid atrovent Bowel regimen e Careful w/ narcs iron at qd (his home regimen) Continue H2 blockade cont PPI     SKIN  - this does not look like E nodosum. This is generalized erthema from pedal edema and pre tibial edema, I suspect lyrica is causing edema A:  - keflex stopped 01/24/14  -resume LMWH, note no DVT on 01/22/14 study AWAIT , ANCA, SCL70, \ D/c lyrica   OVERALL A: Deconditioned P PT consult   NEURO A Feet pain , suspect more d/t neuropathy  . Persists despite KI  P Opiouids prn D/c KI D/c lyrica  GLOBAL Patient updated. Suspect ready to d/c 1/18 with HHRN and HHPT.     Mariel Sleet Beeper  [phone]  Cell  [phone]  If no response or cell goes to voicemail, call beeper [phone]   01/27/2014 9:23 AM

## 2014-01-28 ENCOUNTER — Encounter (HOSPITAL_COMMUNITY): Payer: Self-pay | Admitting: Pulmonary Disease

## 2014-01-28 LAB — BASIC METABOLIC PANEL
ANION GAP: 11 (ref 5–15)
BUN: 30 mg/dL — ABNORMAL HIGH (ref 6–23)
CO2: 35 mmol/L — ABNORMAL HIGH (ref 19–32)
CREATININE: 1.12 mg/dL (ref 0.50–1.35)
Calcium: 9.4 mg/dL (ref 8.4–10.5)
Chloride: 92 mEq/L — ABNORMAL LOW (ref 96–112)
GFR calc Af Amer: 87 mL/min — ABNORMAL LOW (ref >90)
GFR, EST NON AFRICAN AMERICAN: 75 mL/min — AB (ref >90)
Glucose, Bld: 101 mg/dL — ABNORMAL HIGH (ref 70–99)
Potassium: 4.9 mmol/L (ref 3.5–5.1)
Sodium: 138 mmol/L (ref 135–145)

## 2014-01-28 LAB — GLUCOSE, CAPILLARY
GLUCOSE-CAPILLARY: 88 mg/dL (ref 70–99)
GLUCOSE-CAPILLARY: 101 mg/dL — AB (ref 70–99)

## 2014-01-28 MED ORDER — PREDNISONE 10 MG PO TABS: ORAL_TABLET | ORAL | Status: DC

## 2014-01-28 MED ORDER — FLUTICASONE PROPIONATE 50 MCG/ACT NA SUSP: 2.0000 | Freq: Two times a day (BID) | NASAL | Status: DC

## 2014-01-28 MED ORDER — DILTIAZEM HCL ER COATED BEADS 120 MG PO CP24: 120.0000 mg | ORAL_CAPSULE | Freq: Every day | ORAL | Status: DC

## 2014-01-28 NOTE — Progress Notes (Signed)
Patient discharged home, all discharge medications and instructions reviewed and questions answered. Patient to be assisted to vehicle by wheelchair.  

## 2014-01-28 NOTE — Discharge Summary (Signed)
Physician Discharge Summary  Patient ID: [name] MRN: [id number] DOB/AGE: 1965-10-15 49 y.o.  Admit date: 01/17/2014 Discharge date: 01/28/2014    Discharge Diagnoses:  RLL HCAP Recent RSV (d/c 1/6) Sarcoidosis Oxygen dependent OSA Hypertension  Constipation  GERD LE Edema  Deconditioning                                                                        DISCHARGE PLAN BY DIAGNOSIS     RLL HCAP Recent RSV (recent discharge 1/6) Sarcoidosis Oxygen dependent OSA  Discharge Plan: Follow up with Pulmonary as arranged, see below PRN Albuterol Symbicort BID Prednisone taper to off  Nasal saline PRN  Continue oxygen @ 4L   Hypertension   Discharge Plan: Continue Lopressor 50 mg twice a day Cardizem adjusted to 120 mg CD Follow up with PCP in [facility] - Dr. Jonni Sanger  Constipation  GERD  Discharge Plan: PRN Bisacodyl  Continue Pepcid, Miralax  LE Edema  Deconditioning   Discharge Plan: Elevate LE's while at rest PRN Lasix for LE edema                   DISCHARGE SUMMARY   [name] is a 49 y.o. y/o male with a PMH of hypertension, sarcoidosis, OSA on CPAP, COPD, chronic respiratory failure on 4L oxygen at baseline, recent discharge on 1/6 after an asthmatic flare in the setting of RSV virus who presented to the [facility] ER on 01/17/14 with increasing shortness of breath despite home breathing treatments. Symptoms progressed overnight after discharge to an audible wheeze prompting him to activate EMS. On arrival to the emergency room he was noted to have accessory muscle use, fever of 102.3, tachypnea and afib in the 140's. He also reported cough with productive brown sputum and increasing lower extremity edema. Initial CXR evaluation was concerning for a right lower lobe pneumonia. Patient was treated with vancomycin and Zosyn for HCAP.  He was placed on bipap due to respiratory distress with quick improvement in respiratory status.  BNP  was elevated at 492 and patient was diuresed.  Sputum and blood cultures were negative during admission. Antibiotics were stopped on 1/13.  The patient was slow to improve from a dyspnea and oxygenation standpoint. On 1/12 he was noted to have significant increase in bilateral lower extremities with erythema of all left toes.  Lower extremity Doppler was assessed and negative. He was empirically started on Keflex for cellulitis. On 1/16 aggressive diuresis was instituted with resolution of lower extremity edema and erythema. Antibiotics were stopped.  Steroids were tapered to by mouth and the patient tolerated well.  He was medically cleared for discharged on 1/18 with instructions as above.               SIGNIFICANT EVENTS  1/06  d/c after asthmatic-like flare in setting of RSV virus.  1/07  re-admitted w/ HCAP  1/12  Improved but not at baseline.  Maintained on 3L o2 1/12  LE Duplex >> neg for DVT 1/13  Increased LE swelling and erythematous toes.  Started Keflex .  1/16  Diuresed with excellent response.  LE swelling improved, erythema improved   MICRO DATA  1/8 Sputum >> neg 1/7 BCx2 >> Neg  ANTIBIOTICS Vanc 1/7 - 1/12 Zosyn 1/7>>>1/12  Levaquin 1/12 >> 1/13 (stopped due to C Diff risk ) Keflex 1/12 (for HCAP and concern for cellulitis on 1/12) >> 1/15      Discharge Exam: General: WDWN male in NAD Neuro: AAOx4, speech clear, MAE CV: s1s2 rrr, distant PULM: resp's even/non-labored on 4L [place], lungs bilaterally with faint exp wheeze GI: NTND, tol PO's Extremities: warm/dry, 1-2+ pitting edema in BLE (improved significantly), erythema resolving    Filed Vitals:   01/28/14 0608 01/28/14 0640 01/28/14 1014 01/28/14 1100  BP: 114/94  127/87 138/93  Pulse: 90  110 91  Temp: 97.8 F (36.6 C)   98.2 F (36.8 C)  TempSrc: Oral   Oral  Resp: 18   16  Height:      Weight:  185 lb 11.2 oz (84.233 kg)    SpO2: 99%   100%     Discharge Labs  BMET  Recent Labs Lab  01/23/14 0510 01/25/14 0425 01/26/14 0514 01/27/14 0508 01/28/14 0701  NA 138 144 141 141 138  K 4.1 5.1 4.0 4.2 4.9  CL 98 103 96 90* 92*  CO2 34* 30 36* 36* 35*  GLUCOSE 148* 115* 94 151* 101*  BUN _0 28* 30*  CREATININE 0.92 1.27 1.11 0.96 1.12  CALCIUM 9.1 9.8 10.2 9.9 9.4  MG 1.9  --   --   --   --   PHOS 3.7  --   --   --   --     CBC  Recent Labs Lab 01/23/14 0510 01/27/14 0508  HGB 10.7* 11.4*  HCT 35.4* 38.1*  WBC 14.8* 14.2*  PLT 291 297    Anti-Coagulation  Recent Labs Lab 01/23/14 0510  INR 0.97    Discharge Instructions    (HEART FAILURE PATIENTS) Call MD:  Anytime you have any of the following symptoms: 1) 3 pound weight gain in 24 hours or 5 pounds in 1 week 2) shortness of breath, with or without a dry hacking cough 3) swelling in the hands, feet or stomach 4) if you have to sleep on extra pillows at night in order to breathe.    Complete by:  As directed      Call MD for:  difficulty breathing, headache or visual disturbances    Complete by:  As directed      Call MD for:  persistant dizziness or light-headedness    Complete by:  As directed      Call MD for:  persistant nausea and vomiting    Complete by:  As directed      Call MD for:  severe uncontrolled pain    Complete by:  As directed      Call MD for:  temperature >100.4    Complete by:  As directed      Diet - low sodium heart healthy    Complete by:  As directed      Discharge instructions    Complete by:  As directed   1.  Review your medications carefully as they have changed.  2.  Continue your oxygen at 4 liters per nasal cannula 3.  Elevate your feet when sitting in chairs for prolonged periods of time. 4. 64 Oz fluid restriction 5. Weigh yourself daily and keep a log.  Lasix - take 1 tablet (20 mg total) by mouth daily as needed for fluid or edema (Take if > 3lb weight gain in 24 hours or 5 lbs  in 1 week, or increased swelling in lower extremities).     Increase  activity slowly    Complete by:  As directed             Follow-up Information    Follow up with PARRETT,TAMMY, NP On 02/04/2014.   Specialty:  Nurse Practitioner   Why:  Appt at 11:30.  Bring all of your medications with you to your appointment. , Hospital Follow Up / Pulmonary   Contact information:   [address] [place] [address] [phone]          Medication List    STOP taking these medications        diltiazem 30 MG tablet  Commonly known as:  CARDIZEM     pregabalin 200 MG capsule  Commonly known as:  LYRICA      TAKE these medications        albuterol (2.5 MG/3ML) 0.083% nebulizer solution  Commonly known as:  PROVENTIL  Take 2.5 mg by nebulization every 4 (four) hours as needed for wheezing or shortness of breath.     albuterol 108 (90 BASE) MCG/ACT inhaler  Commonly known as:  PROAIR HFA  Inhale 2 puffs into the lungs every 6 (six) hours as needed for wheezing or shortness of breath.     bisacodyl 5 MG EC tablet  Commonly known as:  bisacodyl  Take 1 tablet (5 mg total) by mouth daily as needed for moderate constipation.     budesonide-formoterol 160-4.5 MCG/ACT inhaler  Commonly known as:  SYMBICORT  Inhale 2 puffs into the lungs 2 (two) times daily.     calcium-vitamin D 500-200 MG-UNIT per tablet  Commonly known as:  OSCAL WITH D  Take 1 tablet by mouth every morning.     cholecalciferol 1000 UNITS tablet  Commonly known as:  VITAMIN D  Take 1,000 Units by mouth every morning.     cyclobenzaprine 10 MG tablet  Commonly known as:  FLEXERIL  Take 10 mg by mouth 3 (three) times daily as needed for muscle spasms.     diltiazem 120 MG 24 hr capsule  Commonly known as:  CARDIZEM CD  Take 1 capsule (120 mg total) by mouth daily.     diphenhydrAMINE 25 MG tablet  Commonly known as:  BENADRYL  Take 25 mg by mouth every 6 (six) hours as needed for allergies.     famotidine 20 MG tablet  Commonly known as:  PEPCID  Take 1 tablet (20  mg total) by mouth 2 (two) times daily.     ferrous sulfate 325 (65 FE) MG tablet  Take 1 tablet (325 mg total) by mouth 2 (two) times daily with a meal.     fluticasone 50 MCG/ACT nasal spray  Commonly known as:  FLONASE  Place 2 sprays into both nostrils 2 (two) times daily.     furosemide 20 MG tablet  Commonly known as:  LASIX  Take 1 tablet (20 mg total) by mouth daily as needed for fluid or edema (Take if > 3lb weight gain in 24 hours or 5 lbs in 1 week,  or increased swelling in lower extremities).     guaiFENesin 600 MG 12 hr tablet  Commonly known as:  MUCINEX  Take 1,200 mg by mouth 2 (two) times daily as needed (For congestion.).     HYDROcodone-homatropine 5-1.5 MG/5ML syrup  Commonly known as:  HYCODAN  Take 5 mLs by mouth every 6 (six) hours as needed  for cough.     lidocaine 5 %  Commonly known as:  LIDODERM  Place 1 patch onto the skin daily as needed (For pain.).     metoprolol 50 MG tablet  Commonly known as:  LOPRESSOR  Take 1 tablet (50 mg total) by mouth 2 (two) times daily.     multivitamin with minerals tablet  Take 1 tablet by mouth every morning.     polyethylene glycol packet  Commonly known as:  MIRALAX / GLYCOLAX  Take 17 g by mouth daily.     predniSONE 10 MG tablet  Commonly known as:  DELTASONE  4 tabs for 2 days, then 3 tabs for 2 days, then 2 tabs for 2 days, then 1 tab for 2 days and then stop.     promethazine 25 MG tablet  Commonly known as:  PHENERGAN  Take 1 tablet (25 mg total) by mouth every 6 (six) hours as needed for Nausea.     sodium chloride 0.65 % Soln nasal spray  Commonly known as:  OCEAN  Place 1 spray into both nostrils as needed for congestion.     traMADol 50 MG tablet  Commonly known as:  ULTRAM  Take 1 tablet (50 mg total) by mouth every 6 (six) hours as needed.          Disposition:  Home, with home health PT.  No new needs identified.    Discharged Condition: Zorion Nims has met maximum benefit of  inpatient care and is medically stable and cleared for discharge.  Patient is pending follow up as above.      Time spent on disposition:  Greater than 35 minutes.   Signed: Noe Gens, NP-C [facility] Pgr: [phone] Office: [phone]    Attending:  I have seen and examined the patient with nurse practitioner/resident and agree with the note above.   Roselie Awkward, MD [facility] Pager: [phone] Cell: [phone] If no response, call [phone]

## 2014-01-28 NOTE — Care Management Note (Signed)
CARE MANAGEMENT NOTE 01/28/2014  Patient:  Kehm,Derin   Account Number:  [id number]  Date Initiated:  01/17/2014  Documentation initiated by:  Lanier ClamMAHABIR,KATHY  Subjective/Objective Assessment:   49 y/o m admitted w/Acute resp failure.Readmit 12/30-01/16/14.Hx:  sarcoidosis.     Action/Plan:   From home.   Anticipated DC Date:  01/28/2014   Anticipated DC Plan:  HOME W HOME HEALTH SERVICES      DC Planning Services  CM consult      Choice offered to / List presented to:  C-1 Patient      [facility]     HH arranged  HH-1 RN  HH[facility]2 PT      [facility]   Status of service:  In process, will continue to follow Medicare Important Message given?   (If response is "NO", the following Medicare IM given date fields will be blank) Date Medicare IM given:   Medicare IM given by:   Date Additional Medicare IM given:   Additional Medicare IM given by:    Discharge Disposition:    Per UR Regulation:  Reviewed for med. necessity/level of care/duration of stay  If discussed at Long Length of Stay Meetings, dates discussed:    Comments:  01/28/14 Sandford Crazeora Bobi Daudelin RN,BSN,NCM [phone] Orders written for HHPT/RN.  Pt offered choice and would like to use [facility].  [facility]  No other CM needs noted.  01/22/14 12:50 CM spoke with pt concerning his home oxygen. Pt is active with [facility] for his home oxygen.  He has been on continuous oxygen for severral years and states he will make sure a tank is brought to his room by family for discharge.  Will continue to monitor for disposition. Freddy JakschSarah Jeffries, BSN, [place] [phone].  01/21/14 Lanier ClamKathy Mahabir RN BSN NCM [phone] bipap prn, 02,nebs scheduled,iv abx.D/C plan home.  01/17/14 Lanier ClamKathy Mahabir RN BSN NCM [phone] Monitor progress for d/c needs.No anticipated d/c needs.

## 2014-01-29 ENCOUNTER — Telehealth: Payer: Self-pay | Admitting: Emergency Medicine

## 2014-01-29 LAB — CRYOGLOBULIN

## 2014-01-29 MED ORDER — TRAMADOL HCL 50 MG PO TABS: 50.0000 mg | ORAL_TABLET | Freq: Three times a day (TID) | ORAL | Status: DC | PRN

## 2014-01-29 NOTE — Telephone Encounter (Signed)
Per SN: Offer Tramadol 50mg  #30 - Take 1 po TID prn pain.  F/u with RB soon or with PCP ---- Spoke with patient, aware of rec's per SN.  Rx called into [facility] Pt has upcoming appt with TP 02/04/14 for HFU Nothing further needed.

## 2014-01-29 NOTE — Telephone Encounter (Signed)
Pt calling requesting pain meds for some rib pain on right side. Would not give more detail other than his side is hurting all the time. Pt also reports that his legs(knees/thighs) are hurting as well as his feet. Pt was seen in hospital and was D/C by Dr Delton Coombes 01/20/14. I advised the patient that the on call MD normally does not write narcotic Rx's for patients and if he is okay with waiting we can ask Dr Delton Coombes tomorrow when he is available in the hospital. The patient stated that he cannot wait until tomorrow and needs something tonight. States that he should have been given something when he was D/C from the Hospital but he wasn't. Advised that I can ask but cannot guarantee that an Rx will be written - pt stated "well that's what I expect you to do is ask someone and make it happen since Im calling you." Pt aware we will call back.   Please Dr Kriste Basque as Dr Delton Coombes is off today. Thanks.     Medication List       This list is accurate as of: 01/29/14  5:04 PM.  Always use your most recent med list.               albuterol (2.5 MG/3ML) 0.083% nebulizer solution  Commonly known as:  PROVENTIL  Take 2.5 mg by nebulization every 4 (four) hours as needed for wheezing or shortness of breath.     albuterol 108 (90 BASE) MCG/ACT inhaler  Commonly known as:  PROAIR HFA  Inhale 2 puffs into the lungs every 6 (six) hours as needed for wheezing or shortness of breath.     bisacodyl 5 MG EC tablet  Commonly known as:  bisacodyl  Take 1 tablet (5 mg total) by mouth daily as needed for moderate constipation.     budesonide-formoterol 160-4.5 MCG/ACT inhaler  Commonly known as:  SYMBICORT  Inhale 2 puffs into the lungs 2 (two) times daily.     calcium-vitamin D 500-200 MG-UNIT per tablet  Commonly known as:  OSCAL WITH D  Take 1 tablet by mouth every morning.     cholecalciferol 1000 UNITS tablet  Commonly known as:  VITAMIN D  Take 1,000 Units by mouth every morning.     cyclobenzaprine 10 MG  tablet  Commonly known as:  FLEXERIL  Take 10 mg by mouth 3 (three) times daily as needed for muscle spasms.     diltiazem 120 MG 24 hr capsule  Commonly known as:  CARDIZEM CD  Take 1 capsule (120 mg total) by mouth daily.     diphenhydrAMINE 25 MG tablet  Commonly known as:  BENADRYL  Take 25 mg by mouth every 6 (six) hours as needed for allergies.     famotidine 20 MG tablet  Commonly known as:  PEPCID  Take 1 tablet (20 mg total) by mouth 2 (two) times daily.     ferrous sulfate 325 (65 FE) MG tablet  Take 1 tablet (325 mg total) by mouth 2 (two) times daily with a meal.     fluticasone 50 MCG/ACT nasal spray  Commonly known as:  FLONASE  Place 2 sprays into both nostrils 2 (two) times daily.     furosemide 20 MG tablet  Commonly known as:  LASIX  Take 1 tablet (20 mg total) by mouth daily as needed for fluid or edema (Take if > 3lb weight gain in 24 hours or 5 lbs in 1 week,  or increased  swelling in lower extremities).     guaiFENesin 600 MG 12 hr tablet  Commonly known as:  MUCINEX  Take 1,200 mg by mouth 2 (two) times daily as needed (For congestion.).     HYDROcodone-homatropine 5-1.5 MG/5ML syrup  Commonly known as:  HYCODAN  Take 5 mLs by mouth every 6 (six) hours as needed for cough.     lidocaine 5 %  Commonly known as:  LIDODERM  Place 1 patch onto the skin daily as needed (For pain.).     metoprolol 50 MG tablet  Commonly known as:  LOPRESSOR  Take 1 tablet (50 mg total) by mouth 2 (two) times daily.     multivitamin with minerals tablet  Take 1 tablet by mouth every morning.     polyethylene glycol packet  Commonly known as:  MIRALAX / GLYCOLAX  Take 17 g by mouth daily.     predniSONE 10 MG tablet  Commonly known as:  DELTASONE  4 tabs for 2 days, then 3 tabs for 2 days, then 2 tabs for 2 days, then 1 tab for 2 days and then stop.     promethazine 25 MG tablet  Commonly known as:  PHENERGAN  Take 1 tablet (25 mg total) by mouth every 6 (six)  hours as needed for Nausea.     sodium chloride 0.65 % Soln nasal spray  Commonly known as:  OCEAN  Place 1 spray into both nostrils as needed for congestion.     traMADol 50 MG tablet  Commonly known as:  ULTRAM  Take 1 tablet (50 mg total) by mouth every 6 (six) hours as needed.

## 2014-02-04 ENCOUNTER — Inpatient Hospital Stay: Payer: 59 | Admitting: Adult Health

## 2014-02-11 ENCOUNTER — Ambulatory Visit (INDEPENDENT_AMBULATORY_CARE_PROVIDER_SITE_OTHER): Payer: 59 | Admitting: Internal Medicine

## 2014-02-11 ENCOUNTER — Ambulatory Visit (INDEPENDENT_AMBULATORY_CARE_PROVIDER_SITE_OTHER)
Admission: RE | Admit: 2014-02-11 | Discharge: 2014-02-11 | Disposition: A | Discharge status: Home / Self Care | Payer: 59 | Source: Ambulatory Visit | Attending: Internal Medicine | Admitting: Internal Medicine

## 2014-02-11 ENCOUNTER — Encounter: Payer: Self-pay | Admitting: Internal Medicine

## 2014-02-11 ENCOUNTER — Other Ambulatory Visit (INDEPENDENT_AMBULATORY_CARE_PROVIDER_SITE_OTHER): Payer: 59

## 2014-02-11 VITALS — BP 102/80 | HR 116 | Ht 67.0 in | Wt 184.0 lb

## 2014-02-11 DIAGNOSIS — J471 Bronchiectasis with (acute) exacerbation: Secondary | ICD-10-CM

## 2014-02-11 DIAGNOSIS — J9611 Chronic respiratory failure with hypoxia: Secondary | ICD-10-CM

## 2014-02-11 LAB — BASIC METABOLIC PANEL
BUN: 11 mg/dL (ref 6–23)
CALCIUM: 9 mg/dL (ref 8.4–10.5)
CO2: 34 mEq/L — ABNORMAL HIGH (ref 19–32)
Chloride: 99 mEq/L (ref 96–112)
Creatinine, Ser: 1.04 mg/dL (ref 0.40–1.50)
GFR: 97.49 mL/min (ref >60.00)
GLUCOSE: 109 mg/dL — AB (ref 70–99)
Potassium: 3.9 mEq/L (ref 3.5–5.1)
Sodium: 136 mEq/L (ref 135–145)

## 2014-02-11 LAB — CBC
HCT: 34.8 % — ABNORMAL LOW (ref 39.0–52.0)
Hemoglobin: 11.5 g/dL — ABNORMAL LOW (ref 13.0–17.0)
MCHC: 33.1 g/dL (ref 30.0–36.0)
MCV: 82.7 fl (ref 78.0–100.0)
Platelets: 325 10*3/uL (ref 150.0–400.0)
RBC: 4.21 Mil/uL — ABNORMAL LOW (ref 4.22–5.81)
RDW: 15.9 % — ABNORMAL HIGH (ref 11.5–15.5)
WBC: 5.1 10*3/uL (ref 4.0–10.5)

## 2014-02-11 MED ORDER — PREDNISONE 10 MG PO TABS: ORAL_TABLET | ORAL | Status: DC

## 2014-02-11 NOTE — Patient Instructions (Addendum)
ICD-9-CM ICD-10-CM   1. Bronchiectasis with acute exacerbation 494.1 J47.1   2. Chronic respiratory failure with hypoxia 518.83 J96.11    799.02      Do CXR 2 view Do CBC , bmet START and Take Please take prednisone 40 mg x1 day, then 30 mg x1 day, then 20 mg x1 day, then 10 mg x1 day, and then 5 mg x1 day and stop Will call with results Depending on course and your results, might need antibiotics  Followup  - 1 month with Dr Delton CoombesByrum  - call sooner if worse or go to ER

## 2014-02-11 NOTE — Progress Notes (Signed)
Subjective:    Patient ID: [name], male    DOB: November 17, 1965, 49 y.o.   MRN: [id number]  HPI    IOV 02/11/2014  Chief Complaint  Patient presents with  . Follow-up    Pt here for HFU for pna. Pt c/o increase in SOB, prod cough with yellow and brown mucus, body aches and left upper quadrant pain upon inhalation2-3 days.    Patient of Dr. Baltazar Apo with sarcoidosis and bronchiectasis and chronic respiratory failure with chronic hypoxemia needing oxygen and sleep apnea needing CPAP. He was originally admitted in December 2015 with respirator syncytial virus related respiratory exacerbation and then readmitted 01/17/2014 with the hospital-acquired pneumonia. Discharged on 01/27/2014. Prolonged hospital stay with slow recovery due to his underlying chronic illnesses and also developing erythema in his feet that for which autoimmune disease was ruled out and erythema nodosum was thought unlikely. Ultimately it was thought this was due to Lyrica that was started in the hospital for neuropathic pain.  Currently presents for follow-up. Overall his condition is improved significantly. He is less labored in terms of dyspnea. His erythema is completely resolved. Shortness of breath and cough are improved. However the last couple of days he says that he has got increased cough compared to baseline. He also has left-sided muscular skeletal infra-axillary pain compared to baseline. This is new for the last few days. His sputum is unchanged compared to baseline which is brown in color. Symptoms are associated with generalized tiredness and myalgias. Apparently yesterday he spent the whole day sleeping. He feels like he is coming down with another exacerbation but is no fever or change in sputum or hemoptysis.     has a past medical history of Hypertension; Sarcoidosis; OSA on CPAP; Chronic respiratory failure; Bronchitis; Pneumonia; COPD (chronic obstructive pulmonary disease); and Neuromuscular  disorder.   reports that he has never smoked. He has never used smokeless tobacco.  Past Surgical History  Procedure Laterality Date  . Rotator cuff repair    . Arm surgery     . Video assisted thoracoscopy (vats)/thorocotomy Left 07/17/2012    Procedure: VIDEO ASSISTED THORACOSCOPY (VATS)/BLEB Stapling;  Surgeon: Gaye Pollack, MD;  Location: [facility];  Service: Thoracic;  Laterality: Left;    Allergies  Allergen Reactions  . Clindamycin/Lincomycin Anaphylaxis and Swelling    Made face and tongue swell    Immunization History  Administered Date(s) Administered  . Influenza Split 10/12/2010  . Influenza Whole 02/12/2012  . Influenza,inj,Quad PF,36+ Mos 09/15/2012, 10/12/2013  . Pneumococcal Polysaccharide-23 01/12/2008, 03/06/2012  . Tdap 06/26/2011    Family History  Problem Relation Age of Onset  . Diabetes Father      Current outpatient prescriptions:  .  albuterol (PROAIR HFA) 108 (90 BASE) MCG/ACT inhaler, Inhale 2 puffs into the lungs every 6 (six) hours as needed for wheezing or shortness of breath., Disp: 1 Inhaler, Rfl: 3 .  albuterol (PROVENTIL) (2.5 MG/3ML) 0.083% nebulizer solution, Take 2.5 mg by nebulization every 4 (four) hours as needed for wheezing or shortness of breath. , Disp: , Rfl:  .  bisacodyl (BISACODYL) 5 MG EC tablet, Take 1 tablet (5 mg total) by mouth daily as needed for moderate constipation., Disp: 30 tablet, Rfl: 0 .  calcium-vitamin D (OSCAL WITH D) 500-200 MG-UNIT per tablet, Take 1 tablet by mouth every morning., Disp: , Rfl:  .  cholecalciferol (VITAMIN D) 1000 UNITS tablet, Take 1,000 Units by mouth every morning. , Disp: , Rfl:  .  cyclobenzaprine (FLEXERIL) 10 MG tablet, Take 10 mg by mouth 3 (three) times daily as needed for muscle spasms. , Disp: , Rfl:  .  diltiazem (CARDIZEM CD) 120 MG 24 hr capsule, Take 1 capsule (120 mg total) by mouth daily., Disp: 30 capsule, Rfl: 3 .  diphenhydrAMINE (BENADRYL) 25 MG tablet, Take 25 mg by mouth  every 6 (six) hours as needed for allergies. , Disp: , Rfl:  .  famotidine (PEPCID) 20 MG tablet, Take 1 tablet (20 mg total) by mouth 2 (two) times daily., Disp: 60 tablet, Rfl: 1 .  ferrous sulfate 325 (65 FE) MG tablet, Take 1 tablet (325 mg total) by mouth 2 (two) times daily with a meal., Disp: 60 tablet, Rfl: 0 .  fluticasone (FLONASE) 50 MCG/ACT nasal spray, Place 2 sprays into both nostrils 2 (two) times daily., Disp: 16 g, Rfl: 2 .  furosemide (LASIX) 20 MG tablet, Take 1 tablet (20 mg total) by mouth daily as needed for fluid or edema (Take if > 3lb weight gain in 24 hours or 5 lbs in 1 week,  or increased swelling in lower extremities)., Disp: 30 tablet, Rfl: 1 .  guaiFENesin (MUCINEX) 600 MG 12 hr tablet, Take 1,200 mg by mouth 2 (two) times daily as needed (For congestion.). , Disp: , Rfl:  .  HYDROcodone-homatropine (HYCODAN) 5-1.5 MG/5ML syrup, Take 5 mLs by mouth every 6 (six) hours as needed for cough., Disp: 240 mL, Rfl: 0 .  lidocaine (LIDODERM) 5 %, Place 1 patch onto the skin daily as needed (For pain.). , Disp: , Rfl:  .  metoprolol (LOPRESSOR) 50 MG tablet, Take 1 tablet (50 mg total) by mouth 2 (two) times daily., Disp: 60 tablet, Rfl: 0 .  Multiple Vitamins-Minerals (MULTIVITAMIN WITH MINERALS) tablet, Take 1 tablet by mouth every morning. , Disp: , Rfl:  .  polyethylene glycol (MIRALAX / GLYCOLAX) packet, Take 17 g by mouth daily., Disp: 14 each, Rfl: 0 .  promethazine (PHENERGAN) 25 MG tablet, Take 1 tablet (25 mg total) by mouth every 6 (six) hours as needed for Nausea., Disp: , Rfl:  .  sodium chloride (OCEAN) 0.65 % SOLN nasal spray, Place 1 spray into both nostrils as needed for congestion., Disp: , Rfl: 0 .  traMADol (ULTRAM) 50 MG tablet, Take 1 tablet (50 mg total) by mouth 3 (three) times daily as needed for moderate pain., Disp: 30 tablet, Rfl: 0 .  budesonide-formoterol (SYMBICORT) 160-4.5 MCG/ACT inhaler, Inhale 2 puffs into the lungs 2 (two) times daily. (Patient  not taking: Reported on 02/11/2014), Disp: 1 Inhaler, Rfl: 3    Review of Systems  Constitutional: Negative for fever and unexpected weight change.  HENT: Negative for congestion, dental problem, ear pain, nosebleeds, postnasal drip, rhinorrhea, sinus pressure, sneezing, sore throat and trouble swallowing.   Eyes: Negative for redness and itching.  Respiratory: Positive for cough and shortness of breath. Negative for chest tightness and wheezing.   Cardiovascular: Positive for chest pain. Negative for palpitations and leg swelling.  Gastrointestinal: Negative for nausea and vomiting.  Genitourinary: Negative for dysuria.  Musculoskeletal: Negative for joint swelling.  Skin: Negative for rash.  Neurological: Negative for headaches.  Hematological: Does not bruise/bleed easily.  Psychiatric/Behavioral: Negative for dysphoric mood. The patient is not nervous/anxious.        Objective:   Physical Exam  Constitutional: He is oriented to person, place, and time. He appears well-developed and well-nourished. No distress.  Chronically unwell-looking male but looking better than in  the hospital  HENT:  Head: Normocephalic and atraumatic.  Right Ear: External ear normal.  Left Ear: External ear normal.  Mouth/Throat: Oropharynx is clear and moist. No oropharyngeal exudate.  Has oxygen nasal cannula  Eyes: Conjunctivae and EOM are normal. Pupils are equal, round, and reactive to light. Right eye exhibits no discharge. Left eye exhibits no discharge. No scleral icterus.  Neck: Normal range of motion. Neck supple. No JVD present. No tracheal deviation present. No thyromegaly present.  Cardiovascular: Normal rate, regular rhythm and intact distal pulses.  Exam reveals no gallop and no friction rub.   No murmur heard. Pulmonary/Chest: Effort normal and breath sounds normal. No respiratory distress. He has no wheezes. He has no rales. He exhibits no tenderness.  Left-sided infra-axillary area has  reproducible tenderness  Abdominal: Soft. Bowel sounds are normal. He exhibits no distension and no mass. There is no tenderness. There is no rebound and no guarding.  Musculoskeletal: Normal range of motion. He exhibits no edema or tenderness.  Lymphadenopathy:    He has no cervical adenopathy.  Neurological: He is alert and oriented to person, place, and time. He has normal reflexes. No cranial nerve deficit. Coordination normal.  Skin: Skin is warm and dry. No rash noted. He is not diaphoretic. No erythema. No pallor.  Psychiatric: He has a normal mood and affect. His behavior is normal. Judgment and thought content normal.  Mildly anxious  Nursing note and vitals reviewed.   Filed Vitals:   02/11/14 1544  BP: 102/80  Pulse: 116  Height: 5' 7"  (1.702 m)  Weight: 184 lb (83.462 kg)  SpO2: 100%          Assessment & Plan:     ICD-9-CM ICD-10-CM   1. Bronchiectasis with acute exacerbation 494.1 J47.1 DG Chest 2 View     CBC     Basic Metabolic Panel (BMET)  2. Chronic respiratory failure with hypoxia 518.83 J96.11 DG Chest 2 View   799.02  CBC     Basic Metabolic Panel (BMET)   Unclear reason her to respiratory exacerbation. Unclear what to make of his current body symptoms of lethargy and myalgia. He actually looks much better than when he did at the time of discharge 2 weeks ago. He's had multiple course of antibiotics and unnecessary treatment with that will put him at risk for C. difficile. Therefore this point will hold off antibiotics but instead due to history suggestive of respiratory exacerbation with 5 day prednisone course. Will check chest x-ray, CBC and b met and if these are clearly abnormal will consider empiric antibody therapy. He is agreeable with this plan  I will have him follow Dr. Baltazar Apo in 1 month but sooner if needed   Dr. Brand Males, M.D., [facility].C.P Pulmonary and Critical Care Medicine Staff Physician [facility] Pager: [phone], If no answer or between  15:00h - 7:00h: call [phone]  02/11/2014 4:12 PM

## 2014-02-12 ENCOUNTER — Telehealth: Payer: Self-pay | Admitting: Emergency Medicine

## 2014-02-12 NOTE — Telephone Encounter (Signed)
CXR - nothing new -- all similar to jan 10th L;abs  - mild anemia - similar to when in hospital  rec No change in plan that was recommended at ov Keep up fu with Dr Delton CoombesByrum  Dr. Kalman ShanMurali Hersey Maclellan, M.D., [facility].C.P Pulmonary and Critical Care Medicine Staff Physician [facility] Pager: [phone], If no answer or between  15:00h - 7:00h: call [phone]  02/12/2014 9:35 PM         PULMONARY No results for input(s): PHART, PCO2ART, PO2ART, HCO3, TCO2, O2SAT in the last 168 hours.  Invalid input(s): PCO2, PO2  CBC  Recent Labs Lab 02/11/14 1619  HGB 11.5*  HCT 34.8*  WBC 5.1  PLT 325.0    COAGULATION No results for input(s): INR in the last 168 hours.  CARDIAC  No results for input(s): TROPONINI in the last 168 hours. No results for input(s): PROBNP in the last 168 hours.   CHEMISTRY  Recent Labs Lab 02/11/14 1619  NA 136  K 3.9  CL 99  CO2 34*  GLUCOSE 109*  BUN 11  CREATININE 1.04  CALCIUM 9.0   Estimated Creatinine Clearance: 88.8 mL/min (by C-G formula based on Cr of 1.04).   LIVER No results for input(s): AST, ALT, ALKPHOS, BILITOT, PROT, ALBUMIN, INR in the last 168 hours.   INFECTIOUS No results for input(s): LATICACIDVEN, PROCALCITON in the last 168 hours.   ENDOCRINE CBG (last 3)  No results for input(s): GLUCAP in the last 72 hours.       IMAGING x48h Dg Chest 2 View  02/12/2014   CLINICAL DATA:  History of sarcoidosis, bronchitis, and previous episodes of pneumonia, currently asymptomatic.  EXAM: CHEST  2 VIEW  COMPARISON:  Portable chest x-ray of January 2 and 10, 2016, and CT scan of chest of January 22, 2014.  FINDINGS: There are bilateral confluent interstitial and alveolar densities. These have progressed significantly since the study of January 12, 2014 but are not greatly changed from the study of January 20, 2014. The upper lobes are relatively spared. There is no significant  pleural effusion. There is pleural thickening on the left inferior laterally which is stable. The hilar structures remain enlarged. The cardiac silhouette and pulmonary vascularity are normal. The bony structures are unremarkable.  IMPRESSION: Confluent interstitial and alveolar densities in the mid and lower lung zones consistent with known sarcoidosis. There has not been significant change since the study of 10 January. There is persistent bilateral hilar lymphadenopathy.   Electronically Signed   By: David  [place]   On: 02/12/2014 08:04

## 2014-02-12 NOTE — Telephone Encounter (Signed)
Spoke with pt, requesting results of labs and x-ray from HFU with MR yesterday.  MR please advise on lab and x ray results.  Thank you!

## 2014-02-13 NOTE — Telephone Encounter (Signed)
LMTCB

## 2014-02-13 NOTE — Telephone Encounter (Signed)
Spoke with [name] and advised of lab and cxr results per Dr Marchelle Gearingamaswamy.  [name] verbalized understanding.

## 2014-03-04 ENCOUNTER — Telehealth: Payer: Self-pay | Admitting: Emergency Medicine

## 2014-03-04 MED ORDER — PREDNISONE 10 MG PO TABS: ORAL_TABLET | ORAL | Status: DC

## 2014-03-04 MED ORDER — PREDNISONE 10 MG PO TABS
ORAL_TABLET | ORAL | Status: DC
Start: 2014-03-04 — End: 2014-03-26

## 2014-03-04 NOTE — Telephone Encounter (Signed)
Send script for prednisone 10 mg pill >> 3 pills daily for 2 days, 2 pills daily for 2 days, 1 pill daily for 2 days. 

## 2014-03-04 NOTE — Telephone Encounter (Signed)
Pt aware of recs. RX sent in. Nothing further needed 

## 2014-03-04 NOTE — Telephone Encounter (Signed)
Sending to doc of day as this has not yet been addressed.    VS please advise.  Thank you.  

## 2014-03-04 NOTE — Telephone Encounter (Signed)
Sent script for pred taper, called pt to discuss. He will call us this week if not improving, will need an OV with TP to be seen if remains sick

## 2014-03-04 NOTE — Telephone Encounter (Signed)
Spoke with pt. Reports chest tightness, wheezing and SOB. Denies coughing. Would like pred taper sent in.  RB - please advise. Thanks.

## 2014-03-19 ENCOUNTER — Telehealth: Payer: Self-pay | Admitting: Emergency Medicine

## 2014-03-19 NOTE — Telephone Encounter (Signed)
Pt aware that Dr Delton CoombesByrum is not available and he may not have letter ready for the patient to pick up until next week.  Dr Delton CoombesByrum please advise on letter stating patient's current health status - pt states that it needs to specifically state that he is "disabled."

## 2014-03-23 ENCOUNTER — Other Ambulatory Visit (HOSPITAL_COMMUNITY): Payer: Self-pay

## 2014-03-23 ENCOUNTER — Encounter (HOSPITAL_COMMUNITY): Payer: Self-pay | Admitting: Emergency Medicine

## 2014-03-23 ENCOUNTER — Inpatient Hospital Stay (HOSPITAL_COMMUNITY)
Admission: EM | Admit: 2014-03-23 | Discharge: 2014-03-26 | DRG: 190 | Disposition: A | Discharge status: Home / Self Care | Payer: 59 | Attending: Internal Medicine | Admitting: Internal Medicine

## 2014-03-23 ENCOUNTER — Emergency Department (HOSPITAL_COMMUNITY): Payer: 59

## 2014-03-23 DIAGNOSIS — R0602 Shortness of breath: Secondary | ICD-10-CM | POA: Diagnosis not present

## 2014-03-23 DIAGNOSIS — G4733 Obstructive sleep apnea (adult) (pediatric): Secondary | ICD-10-CM | POA: Diagnosis present

## 2014-03-23 DIAGNOSIS — E876 Hypokalemia: Secondary | ICD-10-CM | POA: Diagnosis present

## 2014-03-23 DIAGNOSIS — Z833 Family history of diabetes mellitus: Secondary | ICD-10-CM | POA: Diagnosis not present

## 2014-03-23 DIAGNOSIS — T380X5A Adverse effect of glucocorticoids and synthetic analogues, initial encounter: Secondary | ICD-10-CM | POA: Diagnosis present

## 2014-03-23 DIAGNOSIS — I10 Essential (primary) hypertension: Secondary | ICD-10-CM | POA: Diagnosis present

## 2014-03-23 DIAGNOSIS — Z9981 Dependence on supplemental oxygen: Secondary | ICD-10-CM | POA: Diagnosis not present

## 2014-03-23 DIAGNOSIS — R000 Tachycardia, unspecified: Secondary | ICD-10-CM | POA: Diagnosis present

## 2014-03-23 DIAGNOSIS — J471 Bronchiectasis with (acute) exacerbation: Principal | ICD-10-CM | POA: Diagnosis present

## 2014-03-23 DIAGNOSIS — E875 Hyperkalemia: Secondary | ICD-10-CM | POA: Diagnosis present

## 2014-03-23 DIAGNOSIS — D860 Sarcoidosis of lung: Secondary | ICD-10-CM | POA: Diagnosis present

## 2014-03-23 DIAGNOSIS — Z7951 Long term (current) use of inhaled steroids: Secondary | ICD-10-CM

## 2014-03-23 DIAGNOSIS — J9621 Acute and chronic respiratory failure with hypoxia: Secondary | ICD-10-CM | POA: Diagnosis present

## 2014-03-23 DIAGNOSIS — D649 Anemia, unspecified: Secondary | ICD-10-CM | POA: Diagnosis present

## 2014-03-23 DIAGNOSIS — R140 Abdominal distension (gaseous): Secondary | ICD-10-CM

## 2014-03-23 DIAGNOSIS — D869 Sarcoidosis, unspecified: Secondary | ICD-10-CM | POA: Diagnosis present

## 2014-03-23 DIAGNOSIS — J9801 Acute bronchospasm: Secondary | ICD-10-CM | POA: Diagnosis present

## 2014-03-23 DIAGNOSIS — Z79899 Other long term (current) drug therapy: Secondary | ICD-10-CM

## 2014-03-23 DIAGNOSIS — Z9989 Dependence on other enabling machines and devices: Secondary | ICD-10-CM

## 2014-03-23 LAB — COMPREHENSIVE METABOLIC PANEL
ALT: 16 U/L (ref 0–53)
ANION GAP: 10 (ref 5–15)
AST: 26 U/L (ref 0–37)
Albumin: 3.9 g/dL (ref 3.5–5.2)
Alkaline Phosphatase: 91 U/L (ref 39–117)
BUN: 8 mg/dL (ref 6–23)
CO2: 27 mmol/L (ref 19–32)
CREATININE: 0.84 mg/dL (ref 0.50–1.35)
Calcium: 9.1 mg/dL (ref 8.4–10.5)
Chloride: 103 mmol/L (ref 96–112)
GFR calc non Af Amer: >90 mL/min (ref >90)
Glucose, Bld: 129 mg/dL — ABNORMAL HIGH (ref 70–99)
POTASSIUM: 3.2 mmol/L — AB (ref 3.5–5.1)
Sodium: 140 mmol/L (ref 135–145)
TOTAL PROTEIN: 7.3 g/dL (ref 6.0–8.3)
Total Bilirubin: 0.8 mg/dL (ref 0.3–1.2)

## 2014-03-23 LAB — I-STAT CHEM 8, ED
BUN: 6 mg/dL (ref 6–23)
CREATININE: 0.9 mg/dL (ref 0.50–1.35)
Calcium, Ion: 1.1 mmol/L — ABNORMAL LOW (ref 1.12–1.23)
Chloride: 101 mmol/L (ref 96–112)
GLUCOSE: 129 mg/dL — AB (ref 70–99)
HEMATOCRIT: 34 % — AB (ref 39.0–52.0)
Hemoglobin: 11.6 g/dL — ABNORMAL LOW (ref 13.0–17.0)
POTASSIUM: 3.2 mmol/L — AB (ref 3.5–5.1)
SODIUM: 139 mmol/L (ref 135–145)
TCO2: 24 mmol/L (ref 0–100)

## 2014-03-23 LAB — BLOOD GAS, ARTERIAL
Acid-Base Excess: 2.7 mmol/L — ABNORMAL HIGH (ref 0.0–2.0)
Bicarbonate: 27.5 mEq/L — ABNORMAL HIGH (ref 20.0–24.0)
Drawn by: 31814
FIO2: 0.4 %
O2 Saturation: 96.8 %
PCO2 ART: 45.3 mmHg — AB (ref 35.0–45.0)
PO2 ART: 89.7 mmHg (ref 80.0–100.0)
Patient temperature: 98.3
TCO2: 25 mmol/L (ref 0–100)
pH, Arterial: 7.4 (ref 7.350–7.450)

## 2014-03-23 LAB — I-STAT TROPONIN, ED: TROPONIN I, POC: 0.01 ng/mL (ref 0.00–0.08)

## 2014-03-23 MED ORDER — ONDANSETRON HCL 4 MG PO TABS: 4.0000 mg | ORAL_TABLET | Freq: Four times a day (QID) | ORAL | Status: DC | PRN

## 2014-03-23 MED ORDER — HYDROCODONE-ACETAMINOPHEN 5-325 MG PO TABS
1.0000 | ORAL_TABLET | ORAL | Status: DC | PRN
  Administered 2014-03-23 – 2014-03-25 (×3): 2 via ORAL
  Filled 2014-03-23 (×3): qty 2

## 2014-03-23 MED ORDER — ACETAMINOPHEN 325 MG PO TABS: 650.0000 mg | ORAL_TABLET | Freq: Four times a day (QID) | ORAL | Status: DC | PRN

## 2014-03-23 MED ORDER — IPRATROPIUM BROMIDE 0.02 % IN SOLN
0.5000 mg | Freq: Once | RESPIRATORY_TRACT | Status: AC
  Administered 2014-03-23: 0.5 mg via RESPIRATORY_TRACT
  Filled 2014-03-23: qty 2.5

## 2014-03-23 MED ORDER — HYDRALAZINE HCL 20 MG/ML IJ SOLN
10.0000 mg | INTRAMUSCULAR | Status: DC | PRN
  Administered 2014-03-23: 10 mg via INTRAVENOUS
  Filled 2014-03-23: qty 1

## 2014-03-23 MED ORDER — BISACODYL 5 MG PO TBEC: 5.0000 mg | DELAYED_RELEASE_TABLET | Freq: Every day | ORAL | Status: DC | PRN

## 2014-03-23 MED ORDER — SODIUM CHLORIDE 0.9 % IV SOLN: 250.0000 mL | INTRAVENOUS | Status: DC | PRN

## 2014-03-23 MED ORDER — FAMOTIDINE 20 MG PO TABS
20.0000 mg | ORAL_TABLET | Freq: Two times a day (BID) | ORAL | Status: DC
  Administered 2014-03-23 – 2014-03-26 (×6): 20 mg via ORAL
  Filled 2014-03-23 (×7): qty 1

## 2014-03-23 MED ORDER — POLYETHYLENE GLYCOL 3350 17 G PO PACK: 17.0000 g | PACK | Freq: Every day | ORAL | Status: DC | PRN

## 2014-03-23 MED ORDER — DOXYCYCLINE HYCLATE 100 MG PO TABS
100.0000 mg | ORAL_TABLET | Freq: Two times a day (BID) | ORAL | Status: DC
  Administered 2014-03-23 – 2014-03-26 (×6): 100 mg via ORAL
  Filled 2014-03-23 (×7): qty 1

## 2014-03-23 MED ORDER — POTASSIUM CHLORIDE CRYS ER 20 MEQ PO TBCR
40.0000 meq | EXTENDED_RELEASE_TABLET | Freq: Once | ORAL | Status: AC
  Administered 2014-03-23: 40 meq via ORAL
  Filled 2014-03-23: qty 2

## 2014-03-23 MED ORDER — CYCLOBENZAPRINE HCL 10 MG PO TABS: 10.0000 mg | ORAL_TABLET | Freq: Three times a day (TID) | ORAL | Status: DC | PRN

## 2014-03-23 MED ORDER — ONDANSETRON HCL 4 MG/2ML IJ SOLN: 4.0000 mg | Freq: Four times a day (QID) | INTRAMUSCULAR | Status: DC | PRN

## 2014-03-23 MED ORDER — BUDESONIDE-FORMOTEROL FUMARATE 160-4.5 MCG/ACT IN AERO
2.0000 | INHALATION_SPRAY | Freq: Two times a day (BID) | RESPIRATORY_TRACT | Status: DC
  Administered 2014-03-23 – 2014-03-26 (×6): 2 via RESPIRATORY_TRACT
  Filled 2014-03-23: qty 6

## 2014-03-23 MED ORDER — SODIUM CHLORIDE 0.9 % IJ SOLN: 3.0000 mL | INTRAMUSCULAR | Status: DC | PRN

## 2014-03-23 MED ORDER — DOCUSATE SODIUM 100 MG PO CAPS
100.0000 mg | ORAL_CAPSULE | Freq: Two times a day (BID) | ORAL | Status: DC
  Administered 2014-03-23 – 2014-03-26 (×6): 100 mg via ORAL
  Filled 2014-03-23 (×7): qty 1

## 2014-03-23 MED ORDER — ENOXAPARIN SODIUM 40 MG/0.4ML ~~LOC~~ SOLN
40.0000 mg | Freq: Every day | SUBCUTANEOUS | Status: DC
  Administered 2014-03-23 – 2014-03-25 (×3): 40 mg via SUBCUTANEOUS
  Filled 2014-03-23 (×4): qty 0.4

## 2014-03-23 MED ORDER — SODIUM CHLORIDE 0.9 % IJ SOLN
3.0000 mL | Freq: Two times a day (BID) | INTRAMUSCULAR | Status: DC
  Administered 2014-03-23 – 2014-03-25 (×3): 3 mL via INTRAVENOUS

## 2014-03-23 MED ORDER — ALBUTEROL (5 MG/ML) CONTINUOUS INHALATION SOLN
20.0000 mg/h | INHALATION_SOLUTION | Freq: Once | RESPIRATORY_TRACT | Status: AC
  Administered 2014-03-23: 20 mg/h via RESPIRATORY_TRACT
  Filled 2014-03-23: qty 20

## 2014-03-23 MED ORDER — FLUTICASONE PROPIONATE 50 MCG/ACT NA SUSP
1.0000 | Freq: Every day | NASAL | Status: DC
  Administered 2014-03-24 – 2014-03-25 (×2): 1 via NASAL
  Filled 2014-03-23 (×2): qty 16

## 2014-03-23 MED ORDER — DEXAMETHASONE SODIUM PHOSPHATE 10 MG/ML IJ SOLN
10.0000 mg | Freq: Once | INTRAMUSCULAR | Status: AC
  Administered 2014-03-23: 10 mg via INTRAVENOUS
  Filled 2014-03-23: qty 1

## 2014-03-23 MED ORDER — SODIUM CHLORIDE 0.9 % IJ SOLN
3.0000 mL | Freq: Two times a day (BID) | INTRAMUSCULAR | Status: DC
  Administered 2014-03-25: 3 mL via INTRAVENOUS

## 2014-03-23 MED ORDER — DILTIAZEM HCL ER COATED BEADS 120 MG PO CP24
120.0000 mg | ORAL_CAPSULE | Freq: Every day | ORAL | Status: DC
  Administered 2014-03-23 – 2014-03-26 (×4): 120 mg via ORAL
  Filled 2014-03-23 (×4): qty 1

## 2014-03-23 MED ORDER — ALBUTEROL SULFATE HFA 108 (90 BASE) MCG/ACT IN AERS: 2.0000 | INHALATION_SPRAY | Freq: Four times a day (QID) | RESPIRATORY_TRACT | Status: DC | PRN

## 2014-03-23 MED ORDER — GUAIFENESIN ER 600 MG PO TB12
1200.0000 mg | ORAL_TABLET | Freq: Two times a day (BID) | ORAL | Status: DC | PRN
  Administered 2014-03-23 – 2014-03-26 (×3): 1200 mg via ORAL
  Filled 2014-03-23 (×3): qty 2

## 2014-03-23 MED ORDER — IPRATROPIUM BROMIDE 0.02 % IN SOLN
0.5000 mg | Freq: Four times a day (QID) | RESPIRATORY_TRACT | Status: DC
  Administered 2014-03-24 – 2014-03-26 (×7): 0.5 mg via RESPIRATORY_TRACT

## 2014-03-23 MED ORDER — METOPROLOL TARTRATE 25 MG PO TABS: 50.0000 mg | ORAL_TABLET | Freq: Once | ORAL | Status: DC

## 2014-03-23 MED ORDER — BISOPROLOL FUMARATE 10 MG PO TABS
10.0000 mg | ORAL_TABLET | Freq: Every day | ORAL | Status: DC
  Administered 2014-03-23 – 2014-03-26 (×4): 10 mg via ORAL
  Filled 2014-03-23: qty 1
  Filled 2014-03-23 (×3): qty 2

## 2014-03-23 MED ORDER — ACETAMINOPHEN 650 MG RE SUPP: 650.0000 mg | Freq: Four times a day (QID) | RECTAL | Status: DC | PRN

## 2014-03-23 MED ORDER — LEVALBUTEROL HCL 0.63 MG/3ML IN NEBU
0.6300 mg | INHALATION_SOLUTION | RESPIRATORY_TRACT | Status: DC | PRN
  Administered 2014-03-24 – 2014-03-26 (×9): 0.63 mg via RESPIRATORY_TRACT
  Filled 2014-03-23 (×10): qty 3

## 2014-03-23 NOTE — ED Provider Notes (Signed)
CSN: [id number]     Arrival date & time 03/23/14  1850 History   First MD Initiated Contact with Patient 03/23/14 1852     No chief complaint on file.    (Consider location/radiation/quality/duration/timing/severity/associated sxs/prior Treatment) HPI   49 year old male with history of COPD, chronic respiratory failure currently on home O2 at 4 L, obstructive sleep apnea on CPAP, sarcoidosis and history of neuromuscular disorder who presents for evaluation of shortness of breath. Patient reports for the past 2-3 days he has noticed worsening shortness of breath. Complaining of chest tightness, pleuritic chest pain, difficulty to take a deep breath. Also endorsed nonproductive cough, having sore throat, and occasional sneezing. Felt that with the change may have affected his symptoms. Has been using Mucinex and increased use of his home nebulizer with minimal relief. Denies any associated fever, lightheadedness, dizziness, productive cough, hemoptysis, back pain, abdominal pain, nausea vomiting diarrhea, numbness or weakness. Prior hospitalization for pneumonia was 2 months ago. Last steroid use was several weeks ago. Patient is a nonsmoker. No prior history of PE or DVT, no recent surgery, prolonged bed rest, leg swelling or calf pain.  Past Medical History  Diagnosis Date  . Hypertension   . Sarcoidosis   . OSA on CPAP   . Chronic respiratory failure     home O@ 4L (01/28/14)  . Bronchitis   . Pneumonia   . COPD (chronic obstructive pulmonary disease)   . Neuromuscular disorder    Past Surgical History  Procedure Laterality Date  . Rotator cuff repair    . Arm surgery     . Video assisted thoracoscopy (vats)/thorocotomy Left 07/17/2012    Procedure: VIDEO ASSISTED THORACOSCOPY (VATS)/BLEB Stapling;  Surgeon: Alleen Borne, MD;  Location: [facility] OR;  Service: Thoracic;  Laterality: Left;   Family History  Problem Relation Age of Onset  . Diabetes Father    History  Substance Use Topics   . Smoking status: Never Smoker   . Smokeless tobacco: Never Used  . Alcohol Use: Yes     Comment: social    Review of Systems  All other systems reviewed and are negative.     Allergies  Clindamycin/lincomycin and Lyrica  Home Medications   Prior to Admission medications   Medication Sig Start Date End Date Taking? Authorizing Provider  albuterol (PROAIR HFA) 108 (90 BASE) MCG/ACT inhaler Inhale 2 puffs into the lungs every 6 (six) hours as needed for wheezing or shortness of breath. 03/28/13   Leslye Peer, MD  albuterol (PROVENTIL) (2.5 MG/3ML) 0.083% nebulizer solution Take 2.5 mg by nebulization every 4 (four) hours as needed for wheezing or shortness of breath.  06/08/12   Historical Provider, MD  bisacodyl (BISACODYL) 5 MG EC tablet Take 1 tablet (5 mg total) by mouth daily as needed for moderate constipation. 01/16/14   Simonne Martinet, NP  budesonide-formoterol [facility]) 160-4.5 MCG/ACT inhaler Inhale 2 puffs into the lungs 2 (two) times daily. Patient not taking: Reported on 02/11/2014 11/28/13   Jeanella Craze, NP  calcium-vitamin D (OSCAL WITH D) 500-200 MG-UNIT per tablet Take 1 tablet by mouth every morning.    Historical Provider, MD  cholecalciferol (VITAMIN D) 1000 UNITS tablet Take 1,000 Units by mouth every morning.     Historical Provider, MD  cyclobenzaprine (FLEXERIL) 10 MG tablet Take 10 mg by mouth 3 (three) times daily as needed for muscle spasms.     Historical Provider, MD  diltiazem (CARDIZEM CD) 120 MG 24 hr capsule Take  1 capsule (120 mg total) by mouth daily. 01/28/14   Jeanella Craze, NP  diphenhydrAMINE (BENADRYL) 25 MG tablet Take 25 mg by mouth every 6 (six) hours as needed for allergies.     Historical Provider, MD  famotidine (PEPCID) 20 MG tablet Take 1 tablet (20 mg total) by mouth 2 (two) times daily. 03/08/12   Renae Fickle, MD  ferrous sulfate 325 (65 FE) MG tablet Take 1 tablet (325 mg total) by mouth 2 (two) times daily with a meal. 06/18/12    Leroy Sea, MD  fluticasone (FLONASE) 50 MCG/ACT nasal spray Place 2 sprays into both nostrils 2 (two) times daily. 01/28/14   Jeanella Craze, NP  furosemide (LASIX) 20 MG tablet Take 1 tablet (20 mg total) by mouth daily as needed for fluid or edema (Take if > 3lb weight gain in 24 hours or 5 lbs in 1 week,  or increased swelling in lower extremities). 11/28/13   Jeanella Craze, NP  guaiFENesin (MUCINEX) 600 MG 12 hr tablet Take 1,200 mg by mouth 2 (two) times daily as needed (For congestion.).     Historical Provider, MD  HYDROcodone-homatropine (HYCODAN) 5-1.5 MG/5ML syrup Take 5 mLs by mouth every 6 (six) hours as needed for cough. 12/11/13   Leslye Peer, MD  lidocaine (LIDODERM) 5 % Place 1 patch onto the skin daily as needed (For pain.).     Historical Provider, MD  metoprolol (LOPRESSOR) 50 MG tablet Take 1 tablet (50 mg total) by mouth 2 (two) times daily. 01/02/13   Leslye Peer, MD  Multiple Vitamins-Minerals (MULTIVITAMIN WITH MINERALS) tablet Take 1 tablet by mouth every morning.     Historical Provider, MD  polyethylene glycol (MIRALAX / GLYCOLAX) packet Take 17 g by mouth daily. 01/16/14   Simonne Martinet, NP  predniSONE (DELTASONE) 10 MG tablet Take  daily for 3 days, then  daily for 3 days, then  daily for 3 days, then  daily for 3 days, then stop 03/04/14   Leslye Peer, MD  predniSONE (DELTASONE) 10 MG tablet Take 3 tabs daily x 2 days,2 tabs daily x 2 days, 1 tab daily x 2 days 03/04/14   Coralyn Helling, MD  promethazine (PHENERGAN) 25 MG tablet Take 1 tablet (25 mg total) by mouth every 6 (six) hours as needed for Nausea. 11/02/11   Historical Provider, MD  sodium chloride (OCEAN) 0.65 % SOLN nasal spray Place 1 spray into both nostrils as needed for congestion. 11/28/13   Jeanella Craze, NP  traMADol (ULTRAM) 50 MG tablet Take 1 tablet (50 mg total) by mouth 3 (three) times daily as needed for moderate pain. 01/29/14   Michele Mcalpine, MD   There were no vitals  taken for this visit. Physical Exam  Constitutional: He is oriented to person, place, and time. He appears well-developed and well-nourished. No distress.  African-American male, sitting upright, in moderate respiratory discomfort, currently on 6 L oxygen.  HENT:  Head: Atraumatic.  Mouth/Throat: Oropharynx is clear and moist.  Eyes: Conjunctivae are normal.  Neck: Normal range of motion. Neck supple. No JVD present.  No nuchal rigidity  Cardiovascular: Intact distal pulses.   Tachycardia without murmurs rubs or gallops  Pulmonary/Chest:  Decreased breath sounds throughout with faint wheezes but no rhonchi or rales.  Patient has take a deep breath after 1-2 sentences. No costal retraction.  Abdominal: Soft. There is no tenderness.  Musculoskeletal: He exhibits no edema.  Neurological: He is  alert and oriented to person, place, and time.  Skin: No rash noted.  Psychiatric: He has a normal mood and affect.    ED Course  Procedures (including critical care time)  Progressively worsening shortness of breath in a patient with significant history of sarcoidosis, COPD, and OSA. Workup initiated. Will provide continuous albuterol nebs along with steroid.  8:01 PM Pt was 88% on his usual 4L O2, and improves to 96% on 6L.  CXR without acute infiltrates.  No fever.  Has had flu shot.  EKG without acute ischemic changes, trop negative.  Care discussed with Dr. Radford PaxBeaton, plan to admit for SOB likely 2/2 sarcoidosis.    8:51 PM I have consulted with [facility], Dr. Adela Glimpseoutova who agrees to see and admit pt to step down for further care.  She also request an ABG.  Dr. Delton CoombesByrum is pt's pulmonologist.  [facility]  Low suspicion for PE given the gradual sxs.  Pt did not take his BP medication today.  BP is elevated. He is on diltiazem and metoprolol.    Labs Review Labs Reviewed  I-STAT CHEM 8, ED - Abnormal; Notable for the following:    Potassium 3.2 (*)    Glucose, Bld  129 (*)    Calcium, Ion 1.10 (*)    Hemoglobin 11.6 (*)    HCT 34.0 (*)    All other components within normal limits  COMPREHENSIVE METABOLIC PANEL  BLOOD GAS, ARTERIAL  I-STAT TROPOININ, ED    Imaging Review Dg Chest 2 View  03/23/2014   CLINICAL DATA:  Acute onset of shortness of breath. For history of sarcoidosis. Chest pain, cough and congestion. Initial encounter.  EXAM: CHEST  2 VIEW  COMPARISON:  Chest radiograph performed 02/11/2014  FINDINGS: Patchy bilateral airspace opacification is similar in appearance to the prior study and reflects the patient's known sarcoidosis. A small left pleural effusion is suspected. No pneumothorax is seen.  No superimposed focal airspace consolidation is identified. The cardiomediastinal silhouette is normal in size. No acute osseous abnormalities are seen.  IMPRESSION: Stable appearance to patchy bilateral airspace opacification, reflecting the patient's known sarcoidosis. Small left pleural effusion suspected. No superimposed acute focal airspace consolidation seen.   Electronically Signed   By: Roanna RaiderJeffery  Chang M.D.   On: 03/23/2014 19:50     EKG Interpretation None      Date: 03/23/2014  Rate: 125  Rhythm: sinus tachycardia  QRS Axis: right  Intervals: normal  ST/T Wave abnormalities: normal  Conduction Disutrbances:none  Narrative Interpretation:   Old EKG Reviewed: unchanged    MDM   Final diagnoses:  SOB (shortness of breath)  Essential hypertension    BP 193/106 mmHg  Pulse 126  Temp(Src) 98.3 F (36.8 C) (Oral)  Resp 26  SpO2 100%  I have reviewed nursing notes and vital signs. I personally reviewed the imaging tests through PACS system  I reviewed available ER/hospitalization records thought the EMR    Fayrene HelperBowie Renata Gambino, Cordelia Poche-C 03/23/14 2055  Nelva Nayobert Beaton, MD 03/24/14 [phone]

## 2014-03-23 NOTE — Progress Notes (Signed)
Called twice to get report  They said will call back

## 2014-03-23 NOTE — H&P (Signed)
PCP: [facility]  Chief Complaint:  Shortness of breath  HPI: [name] is a 49 y.o. male   has a past medical history of Hypertension; Sarcoidosis; OSA on CPAP; Chronic respiratory failure; Bronchitis; Pneumonia; COPD (chronic obstructive pulmonary disease); and Neuromuscular disorder.   Presented with  Patient with hx of pulmonary sarcoidosis presents with worsening shortness of breath and cough. Reports some wheezing.  He has hx of chronic respiratory failure on 4L of O 2 at baseline. Denies any sick contacts no fever. In ER he needed up to 6 L of oxygent to mantain his sats.  He was given Decadron IV and continuous nebs with some improvement. His hypokalemia was treated. I discussed case with [facility] who will try to see patient in consult  Hospitalist was called for admission for sarcoidosis with bronchiectasis with exacerbation.   Review of Systems:    Pertinent positives include: shortness of breath at rest. productive cough, dyspnea on exertion, Constitutional:  No weight loss, night sweats, Fevers, chills, fatigue, weight loss  HEENT:  No headaches, Difficulty swallowing,Tooth/dental problems,Sore throat,  No sneezing, itching, ear ache, nasal congestion, post nasal drip,  Cardio-vascular:  No chest pain, Orthopnea, PND, anasarca, dizziness, palpitations.no Bilateral lower extremity swelling  GI:  No heartburn, indigestion, abdominal pain, nausea, vomiting, diarrhea, change in bowel habits, loss of appetite, melena, blood in stool, hematemesis Resp:  no  No  No excess mucus, no  No non-productive cough, No coughing up of blood.No change in color of mucus.No wheezing. Skin:  no rash or lesions. No jaundice GU:  no dysuria, change in color of urine, no urgency or frequency. No straining to urinate.  No flank pain.  Musculoskeletal:  No joint pain or no joint swelling. No decreased range of motion. No back pain.  Psych:  No change in mood or affect. No  depression or anxiety. No memory loss.  Neuro: no localizing neurological complaints, no tingling, no weakness, no double vision, no gait abnormality, no slurred speech, no confusion  Otherwise ROS are negative except for above, 10 systems were reviewed  Past Medical History: Past Medical History  Diagnosis Date  . Hypertension   . Sarcoidosis   . OSA on CPAP   . Chronic respiratory failure     home O@ 4L (01/28/14)  . Bronchitis   . Pneumonia   . COPD (chronic obstructive pulmonary disease)   . Neuromuscular disorder    Past Surgical History  Procedure Laterality Date  . Rotator cuff repair    . Arm surgery     . Video assisted thoracoscopy (vats)/thorocotomy Left 07/17/2012    Procedure: VIDEO ASSISTED THORACOSCOPY (VATS)/BLEB Stapling;  Surgeon: Alleen Borne, MD;  Location: [facility] OR;  Service: Thoracic;  Laterality: Left;     Medications: Prior to Admission medications   Medication Sig Start Date End Date Taking? Authorizing Provider  albuterol (PROAIR HFA) 108 (90 BASE) MCG/ACT inhaler Inhale 2 puffs into the lungs every 6 (six) hours as needed for wheezing or shortness of breath. 03/28/13  Yes Leslye Peer, MD  albuterol (PROVENTIL) (2.5 MG/3ML) 0.083% nebulizer solution Take 2.5 mg by nebulization every 4 (four) hours as needed for wheezing or shortness of breath (wheezing and shortness of breath).  06/08/12  Yes Historical Provider, MD  bisacodyl (BISACODYL) 5 MG EC tablet Take 1 tablet (5 mg total) by mouth daily as needed for moderate constipation. 01/16/14  Yes Simonne Martinet, NP  budesonide-formoterol [facility]) 160-4.5 MCG/ACT inhaler Inhale 2  puffs into the lungs 2 (two) times daily. 11/28/13  Yes Jeanella CrazeBrandi L Ollis, NP  calcium-vitamin D (OSCAL WITH D) 500-200 MG-UNIT per tablet Take 1 tablet by mouth every morning.   Yes Historical Provider, MD  cholecalciferol (VITAMIN D) 1000 UNITS tablet Take 1,000 Units by mouth every morning.    Yes Historical Provider, MD    cyclobenzaprine (FLEXERIL) 10 MG tablet Take 10 mg by mouth 3 (three) times daily as needed for muscle spasms (muscle spasms).    Yes Historical Provider, MD  diltiazem (CARDIZEM CD) 120 MG 24 hr capsule Take 1 capsule (120 mg total) by mouth daily. 01/28/14  Yes Jeanella CrazeBrandi L Ollis, NP  diphenhydrAMINE (BENADRYL) 25 MG tablet Take 25 mg by mouth every 6 (six) hours as needed for allergies (allergies).    Yes Historical Provider, MD  famotidine (PEPCID) 20 MG tablet Take 1 tablet (20 mg total) by mouth 2 (two) times daily. 03/08/12  Yes Renae FickleMackenzie Short, MD  ferrous sulfate 325 (65 FE) MG tablet Take 1 tablet (325 mg total) by mouth 2 (two) times daily with a meal. 06/18/12  Yes Leroy SeaPrashant K Singh, MD  fluticasone (FLONASE) 50 MCG/ACT nasal spray Place 2 sprays into both nostrils 2 (two) times daily. Patient taking differently: Place 1 spray into both nostrils daily.  01/28/14  Yes Jeanella CrazeBrandi L Ollis, NP  guaiFENesin (MUCINEX) 600 MG 12 hr tablet Take 1,200 mg by mouth 2 (two) times daily as needed (For congestion.).    Yes Historical Provider, MD  metoprolol (LOPRESSOR) 50 MG tablet Take 1 tablet (50 mg total) by mouth 2 (two) times daily. 01/02/13  Yes Leslye Peerobert S Byrum, MD  Multiple Vitamins-Minerals (MULTIVITAMIN WITH MINERALS) tablet Take 1 tablet by mouth every morning.    Yes Historical Provider, MD  polyethylene glycol (MIRALAX / GLYCOLAX) packet Take 17 g by mouth daily. Patient taking differently: Take 17 g by mouth daily as needed for mild constipation (constipation).  01/16/14  Yes Simonne MartinetPeter E Babcock, NP  promethazine (PHENERGAN) 25 MG tablet Take 25 mg by mouth every 6 (six) hours as needed for nausea (nausea).  11/02/11  Yes Historical Provider, MD  sodium chloride (OCEAN) 0.65 % SOLN nasal spray Place 1 spray into both nostrils as needed for congestion. 11/28/13  Yes Jeanella CrazeBrandi L Ollis, NP  furosemide (LASIX) 20 MG tablet Take 1 tablet (20 mg total) by mouth daily as needed for fluid or edema (Take if > 3lb weight  gain in 24 hours or 5 lbs in 1 week,  or increased swelling in lower extremities). Patient not taking: Reported on 03/23/2014 11/28/13   Jeanella CrazeBrandi L Ollis, NP  HYDROcodone-homatropine (HYCODAN) 5-1.5 MG/5ML syrup Take 5 mLs by mouth every 6 (six) hours as needed for cough. Patient not taking: Reported on 03/23/2014 12/11/13   Leslye Peerobert S Byrum, MD  predniSONE (DELTASONE) 10 MG tablet Take 40mg  daily for 3 days, then 30mg  daily for 3 days, then 20mg  daily for 3 days, then 10mg  daily for 3 days, then stop Patient not taking: Reported on 03/23/2014 03/04/14   Leslye Peerobert S Byrum, MD  predniSONE (DELTASONE) 10 MG tablet Take 3 tabs daily x 2 days,2 tabs daily x 2 days, 1 tab daily x 2 days Patient not taking: Reported on 03/23/2014 03/04/14   Coralyn HellingVineet Sood, MD  traMADol (ULTRAM) 50 MG tablet Take 1 tablet (50 mg total) by mouth 3 (three) times daily as needed for moderate pain. Patient not taking: Reported on 03/23/2014 01/29/14   Michele McalpineScott M Nadel, MD  Allergies:   Allergies  Allergen Reactions  . Clindamycin/Lincomycin Anaphylaxis and Swelling    Made face and tongue swell    Social History:  Ambulatory  Independently  Lives at home alone,        reports that he has never smoked. He has never used smokeless tobacco. He reports that he drinks alcohol. He reports that he does not use illicit drugs.    Family History: family history includes Diabetes in his father.    Physical Exam: Patient Vitals for the past 24 hrs:  BP Temp Temp src Pulse Resp SpO2  03/23/14 1900 (!) 193/106 mmHg - - - - 100 %  03/23/14 1859 - - - - - 100 %  03/23/14 1857 - 98.3 F (36.8 C) Oral (!) 126 26 (!) 86 %    1. General:  in No Acute distress 2. Psychological: Alert and   Oriented 3. Head/ENT:     Dry Mucous Membranes                          Head Non traumatic, neck supple                          Normal  Dentition 4. SKIN: decreased Skin turgor,  Skin clean Dry and intact no rash 5. Heart: Regular rate and rhythm no  Murmur, Rub or gallop 6. Lungs: Mild wheezes occasional crackles  overall decreased air movement and decreased breath sounds throughout 7. Abdomen: Soft, non-tender, some distended 8. Lower extremities: no clubbing, cyanosis, or edema 9. Neurologically Grossly intact, moving all 4 extremities equally 10. MSK: Normal range of motion  body mass index is unknown because there is no weight on file.   Labs on Admission:   Results for orders placed or performed during the hospital encounter of 03/23/14 (from the past 24 hour(s))  I-stat troponin, ED     Status: None   Collection Time: 03/23/14  7:23 PM  Result Value Ref Range   Troponin i, poc 0.01 0.00 - 0.08 ng/mL   Comment 3          I-stat chem 8, ed     Status: Abnormal   Collection Time: 03/23/14  7:25 PM  Result Value Ref Range   Sodium 139 135 - 145 mmol/L   Potassium 3.2 (L) 3.5 - 5.1 mmol/L   Chloride 101 96 - 112 mmol/L   BUN 6 6 - 23 mg/dL   Creatinine, Ser 1.61 0.50 - 1.35 mg/dL   Glucose, Bld 096 (H) 70 - 99 mg/dL   Calcium, Ion 0.45 (L) 1.12 - 1.23 mmol/L   TCO2 24 0 - 100 mmol/L   Hemoglobin 11.6 (L) 13.0 - 17.0 g/dL   HCT 40.9 (L) 81.1 - 91.4 %    UA not obtained  Lab Results  Component Value Date   HGBA1C 6.1* 02/13/2012    CrCl cannot be calculated (Unknown ideal weight.).  BNP (last 3 results)  Recent Labs  08/22/13 1732 11/23/13 1458  PROBNP 59.2 97.5    Other results:  I have pearsonaly reviewed this: ECG REPORT  Rate: 125  Rhythm: ST ST&T Change: no ischemia   There were no vitals filed for this visit.   Cultures:    Component Value Date/Time   SDES SPUTUM 01/18/2014 0820   SDES SPUTUM 01/18/2014 0820   SPECREQUEST NONE 01/18/2014 0820   SPECREQUEST NONE 01/18/2014 0820   CULT  01/18/2014 0820    NORMAL OROPHARYNGEAL FLORA Performed at [facility]    REPTSTATUS 01/20/2014 FINAL 01/18/2014 0820   REPTSTATUS 01/18/2014 FINAL 01/18/2014 0820     Radiological Exams on  Admission: Dg Chest 2 View  03/23/2014   CLINICAL DATA:  Acute onset of shortness of breath. For history of sarcoidosis. Chest pain, cough and congestion. Initial encounter.  EXAM: CHEST  2 VIEW  COMPARISON:  Chest radiograph performed 02/11/2014  FINDINGS: Patchy bilateral airspace opacification is similar in appearance to the prior study and reflects the patient's known sarcoidosis. A small left pleural effusion is suspected. No pneumothorax is seen.  No superimposed focal airspace consolidation is identified. The cardiomediastinal silhouette is normal in size. No acute osseous abnormalities are seen.  IMPRESSION: Stable appearance to patchy bilateral airspace opacification, reflecting the patient's known sarcoidosis. Small left pleural effusion suspected. No superimposed acute focal airspace consolidation seen.   Electronically Signed   By: Roanna Raider M.D.   On: 03/23/2014 19:50    Chart has been reviewed  Assessment/Plan  49 year old gentleman history of severe sarcoidosis with bronchiectasis on 4 L of oxygen baseline as well as history of COPD or pulmonology records tension presents with acute on chronic respiratory failure with hypoxia likely secondary to bronchiectasis/COPD exacerbation  Present on Admission:  . SOB (shortness of breath)  - likely secondary to chronic respiratory failure with acute exacerbation. Admit to step down continue oxygen and allergy consult.  . Bronchiectasis with acute exacerbation - continue nebulizer treatments will write for doxycycline and and guaifenesin defer to pulmonology regarding steroid use  . Sarcoid - as per pulmonology  . OSA on CPAP - CPAP ordered  . HTN (hypertension) - given reactive airway component will switch metoprolol to Zebeta give hydralazine when necessary continue home medications  . Acute on chronic respiratory failure with hypoxia - admit to step down provide oxygen   tachycardia this is chronic currently exacerbated to the use of  albuterol. Changed to Xopenex  abdominal distention obtain KUB    Prophylaxis:   Lovenox, Protonix  CODE STATUS:  FULL CODE   Other plan as per orders.  I have spent a total of 65 min on this admission extra time was taken to discuss case with pulmonology   Marcie Shearon 03/23/2014, 8:57 PM  [facility]  Pager [phone]   after 2 AM please page floor coverage PA If 7AM-7PM, please contact the day team taking care of the patient  [email or web address]  Password [email or web address]

## 2014-03-23 NOTE — ED Notes (Signed)
Attempt to call RN for report

## 2014-03-23 NOTE — ED Notes (Signed)
Pt w/ hx of sarcoidosis and wears 3-4 L 02 at home.  States that he began having lt sided/central CP yesterday that has persisted into today.  86% on 4L.  Bumped up to 6L.  Now 99%.

## 2014-03-23 NOTE — Progress Notes (Signed)
RT went to set up CPAP for patient. Sterile water added to water chamber for humidification. Patient home setting is 5-10 auto mode as stated per patient. Patient stated that he was not ready to wear CPAP yet. RT will continue to monitor.

## 2014-03-24 ENCOUNTER — Inpatient Hospital Stay (HOSPITAL_COMMUNITY): Payer: 59

## 2014-03-24 DIAGNOSIS — R0602 Shortness of breath: Secondary | ICD-10-CM

## 2014-03-24 DIAGNOSIS — D869 Sarcoidosis, unspecified: Secondary | ICD-10-CM

## 2014-03-24 DIAGNOSIS — J9621 Acute and chronic respiratory failure with hypoxia: Secondary | ICD-10-CM

## 2014-03-24 DIAGNOSIS — J471 Bronchiectasis with (acute) exacerbation: Principal | ICD-10-CM

## 2014-03-24 LAB — COMPREHENSIVE METABOLIC PANEL
ALK PHOS: 89 U/L (ref 39–117)
ALT: 17 U/L (ref 0–53)
AST: 31 U/L (ref 0–37)
Albumin: 3.8 g/dL (ref 3.5–5.2)
Anion gap: 11 (ref 5–15)
BUN: 10 mg/dL (ref 6–23)
CHLORIDE: 103 mmol/L (ref 96–112)
CO2: 25 mmol/L (ref 19–32)
Calcium: 9.3 mg/dL (ref 8.4–10.5)
Creatinine, Ser: 0.82 mg/dL (ref 0.50–1.35)
GFR calc Af Amer: >90 mL/min (ref >90)
GFR calc non Af Amer: >90 mL/min (ref >90)
Glucose, Bld: 202 mg/dL — ABNORMAL HIGH (ref 70–99)
Potassium: 4.1 mmol/L (ref 3.5–5.1)
SODIUM: 139 mmol/L (ref 135–145)
Total Bilirubin: 0.6 mg/dL (ref 0.3–1.2)
Total Protein: 7.4 g/dL (ref 6.0–8.3)

## 2014-03-24 LAB — CBC
HCT: 34.1 % — ABNORMAL LOW (ref 39.0–52.0)
HEMOGLOBIN: 10.8 g/dL — AB (ref 13.0–17.0)
MCH: 26.8 pg (ref 26.0–34.0)
MCHC: 31.7 g/dL (ref 30.0–36.0)
MCV: 84.6 fL (ref 78.0–100.0)
PLATELETS: 287 10*3/uL (ref 150–400)
RBC: 4.03 MIL/uL — ABNORMAL LOW (ref 4.22–5.81)
RDW: 13.2 % (ref 11.5–15.5)
WBC: 6.6 10*3/uL (ref 4.0–10.5)

## 2014-03-24 LAB — CBC WITH DIFFERENTIAL/PLATELET
Basophils Absolute: 0 10*3/uL (ref 0.0–0.1)
Basophils Relative: 0 % (ref 0–1)
EOS PCT: 1 % (ref 0–5)
Eosinophils Absolute: 0 10*3/uL (ref 0.0–0.7)
HCT: 33.5 % — ABNORMAL LOW (ref 39.0–52.0)
Hemoglobin: 10.7 g/dL — ABNORMAL LOW (ref 13.0–17.0)
LYMPHS ABS: 0.4 10*3/uL — AB (ref 0.7–4.0)
LYMPHS PCT: 5 % — AB (ref 12–46)
MCH: 26.8 pg (ref 26.0–34.0)
MCHC: 31.9 g/dL (ref 30.0–36.0)
MCV: 83.8 fL (ref 78.0–100.0)
MONOS PCT: 2 % — AB (ref 3–12)
Monocytes Absolute: 0.1 10*3/uL (ref 0.1–1.0)
NEUTROS ABS: 7.1 10*3/uL (ref 1.7–7.7)
NEUTROS PCT: 92 % — AB (ref 43–77)
Platelets: 253 10*3/uL (ref 150–400)
RBC: 4 MIL/uL — ABNORMAL LOW (ref 4.22–5.81)
RDW: 13 % (ref 11.5–15.5)
WBC: 7.6 10*3/uL (ref 4.0–10.5)

## 2014-03-24 LAB — MRSA PCR SCREENING: MRSA by PCR: NEGATIVE

## 2014-03-24 LAB — CREATININE, SERUM
Creatinine, Ser: 0.8 mg/dL (ref 0.50–1.35)
GFR calc Af Amer: >90 mL/min (ref >90)
GFR calc non Af Amer: >90 mL/min (ref >90)

## 2014-03-24 LAB — MAGNESIUM: Magnesium: 1.9 mg/dL (ref 1.5–2.5)

## 2014-03-24 LAB — TSH: TSH: 0.259 u[IU]/mL — ABNORMAL LOW (ref 0.350–4.500)

## 2014-03-24 LAB — PHOSPHORUS: Phosphorus: 2.3 mg/dL (ref 2.3–4.6)

## 2014-03-24 MED ORDER — METHYLPREDNISOLONE SODIUM SUCC 40 MG IJ SOLR
40.0000 mg | Freq: Three times a day (TID) | INTRAMUSCULAR | Status: DC
  Administered 2014-03-24 – 2014-03-25 (×3): 40 mg via INTRAVENOUS
  Filled 2014-03-24 (×6): qty 1

## 2014-03-24 MED ORDER — HYDRALAZINE HCL 20 MG/ML IJ SOLN: 10.0000 mg | INTRAMUSCULAR | Status: DC | PRN

## 2014-03-24 MED ORDER — IPRATROPIUM BROMIDE 0.02 % IN SOLN
RESPIRATORY_TRACT | Status: AC
  Filled 2014-03-24: qty 2.5

## 2014-03-24 NOTE — Consult Note (Signed)
Name: [name] MRN: [id number] DOB: 05-10-1965    ADMISSION DATE:  03/23/2014 CONSULTATION DATE:  03/24/2014  REFERRING MD :  Kara Pacer  CHIEF COMPLAINT:  resp distress   HISTORY OF PRESENT ILLNESS: 49 year old never smoker  ,Patient of Dr. Levy Pupa with sarcoidosis and bronchiectasis and chronic respiratory failure with chronic hypoxemia needing 4 L oxygen and sleep apnea needing CPAP He was originally admitted in December 2015 with respirator syncytial virus related respiratory exacerbation and then readmitted 01/17/2014 with the hospital-acquired pneumonia. Discharged on 01/27/2014. Prolonged hospital stay with slow recovery due to his underlying chronic illnesses and also developing erythema in his feet that for which autoimmune disease was ruled out and erythema nodosum was thought unlikely. Ultimately it was thought this was due to Lyrica that was started in the hospital for neuropathic pain  He was last seen in the office on 02/11/14 Presents with increasing respiratory distress and wheezing. He denies symptoms of URI at onset or myalgias or fevers. Reports cough productive of yellow sputum. No pedal edema, orthopnea or paroxysmal nocturnal dyspnea He required 6 L of oxygen, is feeling somewhat better after overnight treatment with steroids and bronchodilators  PAST MEDICAL HISTORY :   has a past medical history of Hypertension; Sarcoidosis; OSA on CPAP; Chronic respiratory failure; Bronchitis; Pneumonia; COPD (chronic obstructive pulmonary disease); and Neuromuscular disorder.  has past surgical history that includes Rotator cuff repair; arm surgery ; and Video assisted thoracoscopy (vats)/thorocotomy (Left, 07/17/2012). Prior to Admission medications   Medication Sig Start Date End Date Taking? Authorizing Provider  albuterol (PROAIR HFA) 108 (90 BASE) MCG/ACT inhaler Inhale 2 puffs into the lungs every 6 (six) hours as needed for wheezing or shortness of breath. 03/28/13  Yes  Leslye Peer, MD  albuterol (PROVENTIL) (2.5 MG/3ML) 0.083% nebulizer solution Take 2.5 mg by nebulization every 4 (four) hours as needed for wheezing or shortness of breath (wheezing and shortness of breath).  06/08/12  Yes Historical Provider, MD  bisacodyl (BISACODYL) 5 MG EC tablet Take 1 tablet (5 mg total) by mouth daily as needed for moderate constipation. 01/16/14  Yes Simonne Martinet, NP  budesonide-formoterol [facility]) 160-4.5 MCG/ACT inhaler Inhale 2 puffs into the lungs 2 (two) times daily. 11/28/13  Yes Jeanella Craze, NP  calcium-vitamin D (OSCAL WITH D) 500-200 MG-UNIT per tablet Take 1 tablet by mouth every morning.   Yes Historical Provider, MD  cholecalciferol (VITAMIN D) 1000 UNITS tablet Take 1,000 Units by mouth every morning.    Yes Historical Provider, MD  cyclobenzaprine (FLEXERIL) 10 MG tablet Take 10 mg by mouth 3 (three) times daily as needed for muscle spasms (muscle spasms).    Yes Historical Provider, MD  diltiazem (CARDIZEM CD) 120 MG 24 hr capsule Take 1 capsule (120 mg total) by mouth daily. 01/28/14  Yes Jeanella Craze, NP  diphenhydrAMINE (BENADRYL) 25 MG tablet Take 25 mg by mouth every 6 (six) hours as needed for allergies (allergies).    Yes Historical Provider, MD  famotidine (PEPCID) 20 MG tablet Take 1 tablet (20 mg total) by mouth 2 (two) times daily. 03/08/12  Yes Renae Fickle, MD  ferrous sulfate 325 (65 FE) MG tablet Take 1 tablet (325 mg total) by mouth 2 (two) times daily with a meal. 06/18/12  Yes Leroy Sea, MD  fluticasone (FLONASE) 50 MCG/ACT nasal spray Place 2 sprays into both nostrils 2 (two) times daily. Patient taking differently: Place 1 spray into both nostrils daily.  01/28/14  Yes  Jeanella Craze, NP  guaiFENesin (MUCINEX) 600 MG 12 hr tablet Take 1,200 mg by mouth 2 (two) times daily as needed (For congestion.).    Yes Historical Provider, MD  metoprolol (LOPRESSOR) 50 MG tablet Take 1 tablet (50 mg total) by mouth 2 (two) times daily.  01/02/13  Yes Leslye Peer, MD  Multiple Vitamins-Minerals (MULTIVITAMIN WITH MINERALS) tablet Take 1 tablet by mouth every morning.    Yes Historical Provider, MD  polyethylene glycol (MIRALAX / GLYCOLAX) packet Take 17 g by mouth daily. Patient taking differently: Take 17 g by mouth daily as needed for mild constipation (constipation).  01/16/14  Yes Simonne Martinet, NP  promethazine (PHENERGAN) 25 MG tablet Take 25 mg by mouth every 6 (six) hours as needed for nausea (nausea).  11/02/11  Yes Historical Provider, MD  sodium chloride (OCEAN) 0.65 % SOLN nasal spray Place 1 spray into both nostrils as needed for congestion. 11/28/13  Yes Jeanella Craze, NP  furosemide (LASIX) 20 MG tablet Take 1 tablet (20 mg total) by mouth daily as needed for fluid or edema (Take if > 3lb weight gain in 24 hours or 5 lbs in 1 week,  or increased swelling in lower extremities). Patient not taking: Reported on 03/23/2014 11/28/13   Jeanella Craze, NP  HYDROcodone-homatropine (HYCODAN) 5-1.5 MG/5ML syrup Take 5 mLs by mouth every 6 (six) hours as needed for cough. Patient not taking: Reported on 03/23/2014 12/11/13   Leslye Peer, MD  predniSONE (DELTASONE) 10 MG tablet Take  daily for 3 days, then  daily for 3 days, then  daily for 3 days, then  daily for 3 days, then stop Patient not taking: Reported on 03/23/2014 03/04/14   Leslye Peer, MD  predniSONE (DELTASONE) 10 MG tablet Take 3 tabs daily x 2 days,2 tabs daily x 2 days, 1 tab daily x 2 days Patient not taking: Reported on 03/23/2014 03/04/14   Coralyn Helling, MD  traMADol (ULTRAM) 50 MG tablet Take 1 tablet (50 mg total) by mouth 3 (three) times daily as needed for moderate pain. Patient not taking: Reported on 03/23/2014 01/29/14   Michele Mcalpine, MD   Allergies  Allergen Reactions  . Clindamycin/Lincomycin Anaphylaxis and Swelling    Made face and tongue swell    FAMILY HISTORY:  family history includes Diabetes in his father. SOCIAL  HISTORY:  reports that he has never smoked. He has never used smokeless tobacco. He reports that he drinks alcohol. He reports that he does not use illicit drugs.  REVIEW OF SYSTEMS:   Constitutional: Negative for fever, chills, weight loss, malaise/fatigue and diaphoresis.  HENT: Negative for hearing loss, ear pain, nosebleeds, congestion, sore throat, neck pain, tinnitus and ear discharge.   Eyes: Negative for blurred vision, double vision, photophobia, pain, discharge and redness.  Respiratory:Positive  for cough, sputum production, shortness of breath, wheezing and  no   hemoptysis,stridor.   Cardiovascular: Negative for chest pain, palpitations, orthopnea, claudication, leg swelling and PND.  Gastrointestinal: Negative for heartburn, nausea, vomiting, abdominal pain, diarrhea, constipation, blood in stool and melena.  Genitourinary: Negative for dysuria, urgency, frequency, hematuria and flank pain.  Musculoskeletal: Negative for myalgias, back pain, joint pain and falls.  Skin: Negative for itching and rash.  Neurological: Negative for dizziness, tingling, tremors, sensory change, speech change, focal weakness, seizures, loss of consciousness, weakness and headaches.  Endo/Heme/Allergies: Negative for environmental allergies and polydipsia. Does not bruise/bleed easily.  SUBJECTIVE:   VITAL SIGNS: Temp:  [  97.2 F (36.2 C)-98.7 F (37.1 C)] 97.2 F (36.2 C) (03/13 0800) Pulse Rate:  [97-132] 97 (03/13 0600) Resp:  [14-37] 14 (03/13 0600) BP: (133-193)/(76-134) 156/108 mmHg (03/13 0600) SpO2:  [86 %-100 %] 100 % (03/13 1003) Weight:  [82 kg (180 lb 12.4 oz)] 82 kg (180 lb 12.4 oz) (03/12 2300)  PHYSICAL EXAMINATION: General:  Chronically ill-appearing, able to lie supine  Neuro:  Alert, interactive non-focal  HEENT:  No JVD CVS:  S1-S2 normal Lungs:  Scattered bilateral rhonchi gi:  Soft and nontender Skin:  No edema or rash    Recent Labs Lab 03/23/14 1919 03/23/14 1925  03/23/14 2359 03/24/14 0353  NA 140 139  --  139  K 3.2* 3.2*  --  4.1  CL 103 101  --  103  CO2 27  --   --  25  BUN 8 6  --  10  CREATININE 0.84 0.90 0.80 0.82  GLUCOSE 129* 129*  --  202*    Recent Labs Lab 03/23/14 1925 03/23/14 2359 03/24/14 0353  HGB 11.6* 10.7* 10.8*  HCT 34.0* 33.5* 34.1*  WBC  --  7.6 6.6  PLT  --  253 287   Dg Chest 2 View  03/23/2014   CLINICAL DATA:  Acute onset of shortness of breath. For history of sarcoidosis. Chest pain, cough and congestion. Initial encounter.  EXAM: CHEST  2 VIEW  COMPARISON:  Chest radiograph performed 02/11/2014  FINDINGS: Patchy bilateral airspace opacification is similar in appearance to the prior study and reflects the patient's known sarcoidosis. A small left pleural effusion is suspected. No pneumothorax is seen.  No superimposed focal airspace consolidation is identified. The cardiomediastinal silhouette is normal in size. No acute osseous abnormalities are seen.  IMPRESSION: Stable appearance to patchy bilateral airspace opacification, reflecting the patient's known sarcoidosis. Small left pleural effusion suspected. No superimposed acute focal airspace consolidation seen.   Electronically Signed   By: Roanna RaiderJeffery  Chang M.D.   On: 03/23/2014 19:50   Dg Abd 1 View  03/24/2014   CLINICAL DATA:  Abdominal distension, history hypertension, sarcoidosis, COPD  EXAM: ABDOMEN - 1 VIEW  COMPARISON:  Portable exam 0534 hr compared to 01/15/2014  FINDINGS: Normal bowel gas pattern.  No bowel dilatation, bowel wall thickening or evidence of obstruction.  Increased stool burden seen within colon on previous exam resolved.  Osseous structures unremarkable.  No urinary tract calcification.  IMPRESSION: Normal bowel gas pattern.   Electronically Signed   By: Ulyses SouthwardMark  Boles M.D.   On: 03/24/2014 07:28    ASSESSMENT / PLAN:  Acute exacerbation of bronchiectasis   sarcoidosis and chronic respiratory failure on 4 L OSA on C Pap    Recommend- Solu-Medrol 40 every 8 for bronchospasm -can taper rapidly once this is resolved   agree with doxycycline for 7 days -his x-rays unchanged from baseline   does not have any systemic symptoms or febrile illness to treat empirically for flu C Pap during sleep  Cyril Mourningakesh Alva MD. FCCP. [facility] Pager [phone] If no response call [phone]    03/24/2014, 11:21 AM

## 2014-03-24 NOTE — Progress Notes (Signed)
[facility]                                                                              Patient Demographics  [name], is a 49 y.o. male, DOB - October 03, 1965, ZOX:[id number]  Admit date - 03/23/2014   Admitting Physician Therisa Doyne, MD  Outpatient Primary MD for the patient is PROVIDER NOT IN SYSTEM  LOS - 1   Chief Complaint  Patient presents with  . Shortness of Breath      HPI on 03/23/2014 by Dr. Therisa Doyne Patient with hx of pulmonary sarcoidosis presents with worsening shortness of breath and cough. Reports some wheezing. He has hx of chronic respiratory failure on 4L of O 2 at baseline. Denies any sick contacts no fever. In ER he needed up to 6 L of oxygent to mantain his sats. He was given Decadron IV and continuous nebs with some improvement. His hypokalemia was treated. I discussed case with [facility] who will try to see patient in consult Hospitalist was called for admission for sarcoidosis with bronchiectasis with exacerbation.   Assessment & Plan   Acute on chronic respiratory failure with hypoxia/Acute exacerbation of bronchiectasis/Sarcoidosis -Patient uses 4L home oxygen, requires 6L at present time. Oxygen sats upon admission 86% -patient currently afebrile with no leukocytosis -CXR: Stable, patchy bilateral airspace opacification- sarcoidosis, small left pleural effusion, no consolidation -Continue doxycycline, symbicort, flonase, neb treatments -patient given one dose of decadron at admission -Will add on steroids -Pulmonology consulted and appreciated, patient well known to Dr. Delton Coombes  Abdominal distention -Per patient, normal -Abdominal Xray: normal bowel gas pattern -Continue bowel regimen  Essential hypertension -Stable, continue bisoprolol, cardizem  Obstructive sleep apnea -Continue CPAP QHS  Chronic normocytic anemia -Basline hemoglobin 10-11, currently 10.8 -Continue to monitor CBC   Hypokalemia -Replaced -Continue to  monitor  Code Status: Full  Family Communication: None at bedside  Disposition Plan: Admitted.  If continues to be stable, will transfer to the floor.   Time Spent in minutes   30 minutes  Procedures  None  Consults   Pulmonology   DVT Prophylaxis  lovenox  Lab Results  Component Value Date   PLT 287 03/24/2014    Medications  Scheduled Meds: . bisoprolol  10 mg Oral Daily  . budesonide-formoterol  2 puff Inhalation BID  . diltiazem  120 mg Oral Daily  . docusate sodium  100 mg Oral BID  . doxycycline  100 mg Oral Q12H  . enoxaparin (LOVENOX) injection  40 mg Subcutaneous QHS  . famotidine  20 mg Oral BID  . fluticasone  1 spray Each Nare Daily  . ipratropium      . ipratropium  0.5 mg Nebulization Q6H  . methylPREDNISolone (SOLU-MEDROL) injection  40 mg Intravenous 3 times per day  . sodium chloride  3 mL Intravenous Q12H  . sodium chloride  3 mL Intravenous Q12H   Continuous Infusions:  PRN Meds:.sodium chloride, acetaminophen **OR** acetaminophen, bisacodyl, cyclobenzaprine, guaiFENesin, hydrALAZINE, HYDROcodone-acetaminophen, levalbuterol, ondansetron **OR** ondansetron (ZOFRAN) IV, polyethylene glycol, sodium chloride  Antibiotics    Anti-infectives    Start     Dose/Rate Route Frequency Ordered Stop   03/23/14 2330  doxycycline (VIBRA-TABS)  tablet 100 mg     100 mg Oral Every 12 hours 03/23/14 2302          Subjective:   Andris Baumann seen and examined today.  Patient feels his breathing has improved, although currently on 6L oxygen.  Denies chest pain.  States that his abdomen is usually distended, and that he has BM every day or every other day.  Objective:   Filed Vitals:   03/24/14 0600 03/24/14 0800 03/24/14 1003 03/24/14 1121  BP: 156/108   151/82  Pulse: 97     Temp:  97.2 F (36.2 C)    TempSrc:  Oral    Resp: 14     Height:      Weight:      SpO2: 100%  100%     Wt Readings from Last 3 Encounters:  03/23/14 82 kg (180 lb 12.4 oz)   02/11/14 83.462 kg (184 lb)  01/16/14 86.637 kg (191 lb)     Intake/Output Summary (Last 24 hours) at 03/24/14 1157 Last data filed at 03/24/14 1100  Gross per 24 hour  Intake    530 ml  Output    650 ml  Net   -120 ml    Exam  General: Well developed, well nourished, NAD, appears stated age  HEENT: NCAT, mucous membranes moist.   Cardiovascular: S1 S2 auscultated, RRR  Respiratory: +rhonchi, some exp wheezing noted  Abdomen: Soft, nontender, distended, + bowel sounds  Extremities: warm dry without cyanosis clubbing or edema  Neuro: AAOx3, nonfocal  Psych: Normal affect and demeanor with intact judgement and insight  Data Review   Micro Results No results found for this or any previous visit (from the past 240 hour(s)).  Radiology Reports Dg Chest 2 View  03/23/2014   CLINICAL DATA:  Acute onset of shortness of breath. For history of sarcoidosis. Chest pain, cough and congestion. Initial encounter.  EXAM: CHEST  2 VIEW  COMPARISON:  Chest radiograph performed 02/11/2014  FINDINGS: Patchy bilateral airspace opacification is similar in appearance to the prior study and reflects the patient's known sarcoidosis. A small left pleural effusion is suspected. No pneumothorax is seen.  No superimposed focal airspace consolidation is identified. The cardiomediastinal silhouette is normal in size. No acute osseous abnormalities are seen.  IMPRESSION: Stable appearance to patchy bilateral airspace opacification, reflecting the patient's known sarcoidosis. Small left pleural effusion suspected. No superimposed acute focal airspace consolidation seen.   Electronically Signed   By: Roanna Raider M.D.   On: 03/23/2014 19:50   Dg Abd 1 View  03/24/2014   CLINICAL DATA:  Abdominal distension, history hypertension, sarcoidosis, COPD  EXAM: ABDOMEN - 1 VIEW  COMPARISON:  Portable exam 0534 hr compared to 01/15/2014  FINDINGS: Normal bowel gas pattern.  No bowel dilatation, bowel wall  thickening or evidence of obstruction.  Increased stool burden seen within colon on previous exam resolved.  Osseous structures unremarkable.  No urinary tract calcification.  IMPRESSION: Normal bowel gas pattern.   Electronically Signed   By: Ulyses Southward M.D.   On: 03/24/2014 07:28    CBC  Recent Labs Lab 03/23/14 1925 03/23/14 2359 03/24/14 0353  WBC  --  7.6 6.6  HGB 11.6* 10.7* 10.8*  HCT 34.0* 33.5* 34.1*  PLT  --  253 287  MCV  --  83.8 84.6  MCH  --  26.8 26.8  MCHC  --  31.9 31.7  RDW  --  13.0 13.2  LYMPHSABS  --  0.4*  --  MONOABS  --  0.1  --   EOSABS  --  0.0  --   BASOSABS  --  0.0  --     Chemistries   Recent Labs Lab 03/23/14 1919 03/23/14 1925 03/23/14 2359 03/24/14 0353  NA 140 139  --  139  K 3.2* 3.2*  --  4.1  CL 103 101  --  103  CO2 27  --   --  25  GLUCOSE 129* 129*  --  202*  BUN 8 6  --  10  CREATININE 0.84 0.90 0.80 0.82  CALCIUM 9.1  --   --  9.3  MG  --   --   --  1.9  AST 26  --   --  31  ALT 16  --   --  17  ALKPHOS 91  --   --  89  BILITOT 0.8  --   --  0.6   ------------------------------------------------------------------------------------------------------------------ estimated creatinine clearance is 111.7 mL/min (by C-G formula based on Cr of 0.82). ------------------------------------------------------------------------------------------------------------------ No results for input(s): HGBA1C in the last 72 hours. ------------------------------------------------------------------------------------------------------------------ No results for input(s): CHOL, HDL, LDLCALC, TRIG, CHOLHDL, LDLDIRECT in the last 72 hours. ------------------------------------------------------------------------------------------------------------------  Recent Labs  03/24/14 0353  TSH 0.259*   ------------------------------------------------------------------------------------------------------------------ No results for input(s):  VITAMINB12, FOLATE, FERRITIN, TIBC, IRON, RETICCTPCT in the last 72 hours.  Coagulation profile No results for input(s): INR, PROTIME in the last 168 hours.  No results for input(s): DDIMER in the last 72 hours.  Cardiac Enzymes No results for input(s): CKMB, TROPONINI, MYOGLOBIN in the last 168 hours.  Invalid input(s): CK ------------------------------------------------------------------------------------------------------------------ Invalid input(s): POCBNP    Huldah Marin D.O. on 03/24/2014 at 11:57 AM  Between 7am to 7pm - Pager - [phone]  After 7pm go to [email or web address] - password [email or web address]  And look for the night coverage person covering for me after hours  [facility] Office  [phone]

## 2014-03-25 ENCOUNTER — Ambulatory Visit: Payer: 59 | Admitting: Emergency Medicine

## 2014-03-25 LAB — CBC
HCT: 34.4 % — ABNORMAL LOW (ref 39.0–52.0)
HEMOGLOBIN: 10.9 g/dL — AB (ref 13.0–17.0)
MCH: 27 pg (ref 26.0–34.0)
MCHC: 31.7 g/dL (ref 30.0–36.0)
MCV: 85.1 fL (ref 78.0–100.0)
Platelets: 324 10*3/uL (ref 150–400)
RBC: 4.04 MIL/uL — AB (ref 4.22–5.81)
RDW: 13.3 % (ref 11.5–15.5)
WBC: 13.5 10*3/uL — ABNORMAL HIGH (ref 4.0–10.5)

## 2014-03-25 LAB — BASIC METABOLIC PANEL
Anion gap: 9 (ref 5–15)
BUN: 16 mg/dL (ref 6–23)
CO2: 29 mmol/L (ref 19–32)
Calcium: 10 mg/dL (ref 8.4–10.5)
Chloride: 100 mmol/L (ref 96–112)
Creatinine, Ser: 0.9 mg/dL (ref 0.50–1.35)
GLUCOSE: 168 mg/dL — AB (ref 70–99)
POTASSIUM: 5.2 mmol/L — AB (ref 3.5–5.1)
Sodium: 138 mmol/L (ref 135–145)

## 2014-03-25 MED ORDER — IPRATROPIUM BROMIDE 0.02 % IN SOLN
RESPIRATORY_TRACT | Status: AC
  Filled 2014-03-25: qty 2.5

## 2014-03-25 MED ORDER — METHYLPREDNISOLONE SODIUM SUCC 40 MG IJ SOLR
40.0000 mg | Freq: Two times a day (BID) | INTRAMUSCULAR | Status: DC
  Administered 2014-03-25 – 2014-03-26 (×2): 40 mg via INTRAVENOUS
  Filled 2014-03-25 (×4): qty 1

## 2014-03-25 MED ORDER — INSULIN ASPART 100 UNIT/ML IV SOLN
10.0000 [IU] | Freq: Once | INTRAVENOUS | Status: AC
  Administered 2014-03-25: 10 [IU] via INTRAVENOUS

## 2014-03-25 MED ORDER — DEXTROSE 50 % IV SOLN
1.0000 | Freq: Once | INTRAVENOUS | Status: AC
  Administered 2014-03-25: 50 mL via INTRAVENOUS
  Filled 2014-03-25: qty 50

## 2014-03-25 NOTE — Progress Notes (Signed)
Patient stated that he would put himself on his CPAP when he was ready.

## 2014-03-25 NOTE — Progress Notes (Signed)
Pt states that he places himself on CPAP as he does at home.

## 2014-03-25 NOTE — Progress Notes (Signed)
[facility]                                                                              Patient Demographics  [name], is a 49 y.o. male, DOB - 07-Mar-1965, ZOX:[id number]  Admit date - 03/23/2014   Admitting Physician Therisa Doyne, MD  Outpatient Primary MD for the patient is PROVIDER NOT IN SYSTEM  LOS - 2   Chief Complaint  Patient presents with  . Shortness of Breath      HPI on 03/23/2014 by Dr. Therisa Doyne Patient with hx of pulmonary sarcoidosis presents with worsening shortness of breath and cough. Reports some wheezing. He has hx of chronic respiratory failure on 4L of O 2 at baseline. Denies any sick contacts no fever. In ER he needed up to 6 L of oxygent to mantain his sats. He was given Decadron IV and continuous nebs with some improvement. His hypokalemia was treated. I discussed case with [facility] who will try to see patient in consult Hospitalist was called for admission for sarcoidosis with bronchiectasis with exacerbation.   Assessment & Plan   Acute on chronic respiratory failure with hypoxia/Acute exacerbation of bronchiectasis/Sarcoidosis -Improving -Patient uses 4L home oxygen, required 6L upon admission as Oxygen sats were 86% -Patient currently back on 4L -patient currently afebrile  -CXR: Stable, patchy bilateral airspace opacification- sarcoidosis, small left pleural effusion, no consolidation -Continue doxycycline, symbicort, flonase, neb treatments -patient given one dose of decadron at admission -Will decrease solumedrol -Pulmonology consulted and appreciated, patient well known to Dr. Delton Coombes  Abdominal distention -Per patient, normal -Abdominal Xray: normal bowel gas pattern -Continue bowel regimen  Leukocytosis -Likely secondary to steroids, continue to monitor CBC -CXR negative, no urinary symptoms  Essential hypertension -Stable, continue bisoprolol, cardizem  Obstructive sleep apnea -Continue CPAP QHS  Chronic  normocytic anemia -Basline hemoglobin 10-11, currently 10.9 -Continue to monitor CBC   Hypokalemia/Hyperkalemia -Replaced- Potassium 5.2 -Will given insulin/D50 and continue to monitor  Code Status: Full  Family Communication: None at bedside  Disposition Plan: Admitted.  Transfer patient to medical floor.  If patient continues to improve, will discharge on 3/15.   Time Spent in minutes   30 minutes  Procedures  None  Consults   Pulmonology   DVT Prophylaxis  lovenox  Lab Results  Component Value Date   PLT 324 03/25/2014    Medications  Scheduled Meds: . bisoprolol  10 mg Oral Daily  . budesonide-formoterol  2 puff Inhalation BID  . diltiazem  120 mg Oral Daily  . docusate sodium  100 mg Oral BID  . doxycycline  100 mg Oral Q12H  . enoxaparin (LOVENOX) injection  40 mg Subcutaneous QHS  . famotidine  20 mg Oral BID  . fluticasone  1 spray Each Nare Daily  . ipratropium      . ipratropium  0.5 mg Nebulization Q6H  . methylPREDNISolone (SOLU-MEDROL) injection  40 mg Intravenous 3 times per day  . sodium chloride  3 mL Intravenous Q12H  . sodium chloride  3 mL Intravenous Q12H   Continuous Infusions:  PRN Meds:.sodium chloride, acetaminophen **OR** acetaminophen, bisacodyl, cyclobenzaprine, guaiFENesin, hydrALAZINE, HYDROcodone-acetaminophen, levalbuterol, ondansetron **OR** ondansetron (ZOFRAN) IV, polyethylene glycol,  sodium chloride  Antibiotics    Anti-infectives    Start     Dose/Rate Route Frequency Ordered Stop   03/23/14 2330  doxycycline (VIBRA-TABS) tablet 100 mg     100 mg Oral Every 12 hours 03/23/14 2302          Subjective:   [name] seen and examined today.  Patient feels his breathing has improved but still feels "tight".  He denies chest pain, dizziness.  Had BM yesterday, but feels abdomen is distended (according to patient- chronic).  Objective:   Filed Vitals:   03/25/14 0900 03/25/14 1017 03/25/14 1027 03/25/14 1040  BP:     134/87  Pulse: 80   94  Temp:    97.7 F (36.5 C)  TempSrc:      Resp: 20   18  Height:      Weight:      SpO2: 100% 99% 98% 95%    Wt Readings from Last 3 Encounters:  03/25/14 83.8 kg (184 lb 11.9 oz)  02/11/14 83.462 kg (184 lb)  01/16/14 86.637 kg (191 lb)     Intake/Output Summary (Last 24 hours) at 03/25/14 1131 Last data filed at 03/25/14 1000  Gross per 24 hour  Intake    960 ml  Output   1875 ml  Net   -915 ml    Exam  General: Well developed, well nourished, NAD  HEENT: NCAT, mucous membranes moist.   Cardiovascular: S1 S2 auscultated, RRR  Respiratory: Slightly diminished in the lower fields, otherwise clear  Abdomen: Nontender, distended, + bowel sounds  Neuro: AAOx3, nonfocal  Psych: Appropriate mood and affect  Data Review   Micro Results Recent Results (from the past 240 hour(s))  MRSA PCR Screening     Status: None   Collection Time: 03/23/14 11:08 PM  Result Value Ref Range Status   MRSA by PCR NEGATIVE NEGATIVE Final    Comment:        The GeneXpert MRSA Assay (FDA approved for NASAL specimens only), is one component of a comprehensive MRSA colonization surveillance program. It is not intended to diagnose MRSA infection nor to guide or monitor treatment for MRSA infections. Performed at [facility]     Radiology Reports Dg Chest 2 View  03/23/2014   CLINICAL DATA:  Acute onset of shortness of breath. For history of sarcoidosis. Chest pain, cough and congestion. Initial encounter.  EXAM: CHEST  2 VIEW  COMPARISON:  Chest radiograph performed 02/11/2014  FINDINGS: Patchy bilateral airspace opacification is similar in appearance to the prior study and reflects the patient's known sarcoidosis. A small left pleural effusion is suspected. No pneumothorax is seen.  No superimposed focal airspace consolidation is identified. The cardiomediastinal silhouette is normal in size. No acute osseous abnormalities are seen.  IMPRESSION:  Stable appearance to patchy bilateral airspace opacification, reflecting the patient's known sarcoidosis. Small left pleural effusion suspected. No superimposed acute focal airspace consolidation seen.   Electronically Signed   By: Roanna Raider M.D.   On: 03/23/2014 19:50   Dg Abd 1 View  03/24/2014   CLINICAL DATA:  Abdominal distension, history hypertension, sarcoidosis, COPD  EXAM: ABDOMEN - 1 VIEW  COMPARISON:  Portable exam 0534 hr compared to 01/15/2014  FINDINGS: Normal bowel gas pattern.  No bowel dilatation, bowel wall thickening or evidence of obstruction.  Increased stool burden seen within colon on previous exam resolved.  Osseous structures unremarkable.  No urinary tract calcification.  IMPRESSION: Normal bowel gas pattern.  Electronically Signed   By: Ulyses SouthwardMark  Boles M.D.   On: 03/24/2014 07:28    CBC  Recent Labs Lab 03/23/14 1925 03/23/14 2359 03/24/14 0353 03/25/14 0412  WBC  --  7.6 6.6 13.5*  HGB 11.6* 10.7* 10.8* 10.9*  HCT 34.0* 33.5* 34.1* 34.4*  PLT  --  253 287 324  MCV  --  83.8 84.6 85.1  MCH  --  26.8 26.8 27.0  MCHC  --  31.9 31.7 31.7  RDW  --  13.0 13.2 13.3  LYMPHSABS  --  0.4*  --   --   MONOABS  --  0.1  --   --   EOSABS  --  0.0  --   --   BASOSABS  --  0.0  --   --     Chemistries   Recent Labs Lab 03/23/14 1919 03/23/14 1925 03/23/14 2359 03/24/14 0353 03/25/14 0412  NA 140 139  --  139 138  K 3.2* 3.2*  --  4.1 5.2*  CL 103 101  --  103 100  CO2 27  --   --  25 29  GLUCOSE 129* 129*  --  202* 168*  BUN 8 6  --  10 16  CREATININE 0.84 0.90 0.80 0.82 0.90  CALCIUM 9.1  --   --  9.3 10.0  MG  --   --   --  1.9  --   AST 26  --   --  31  --   ALT 16  --   --  17  --   ALKPHOS 91  --   --  89  --   BILITOT 0.8  --   --  0.6  --    ------------------------------------------------------------------------------------------------------------------ estimated creatinine clearance is 102.8 mL/min (by C-G formula based on Cr of  0.9). ------------------------------------------------------------------------------------------------------------------ No results for input(s): HGBA1C in the last 72 hours. ------------------------------------------------------------------------------------------------------------------ No results for input(s): CHOL, HDL, LDLCALC, TRIG, CHOLHDL, LDLDIRECT in the last 72 hours. ------------------------------------------------------------------------------------------------------------------  Recent Labs  03/24/14 0353  TSH 0.259*   ------------------------------------------------------------------------------------------------------------------ No results for input(s): VITAMINB12, FOLATE, FERRITIN, TIBC, IRON, RETICCTPCT in the last 72 hours.  Coagulation profile No results for input(s): INR, PROTIME in the last 168 hours.  No results for input(s): DDIMER in the last 72 hours.  Cardiac Enzymes No results for input(s): CKMB, TROPONINI, MYOGLOBIN in the last 168 hours.  Invalid input(s): CK ------------------------------------------------------------------------------------------------------------------ Invalid input(s): POCBNP    Elyan Vanwieren D.O. on 03/25/2014 at 11:31 AM  Between 7am to 7pm - Pager - [phone]  After 7pm go to [email or web address] - password [email or web address]  And look for the night coverage person covering for me after hours  [facility] Office  [phone]

## 2014-03-26 LAB — BASIC METABOLIC PANEL
ANION GAP: 9 (ref 5–15)
BUN: 18 mg/dL (ref 6–23)
CALCIUM: 9.5 mg/dL (ref 8.4–10.5)
CO2: 29 mmol/L (ref 19–32)
Chloride: 98 mmol/L (ref 96–112)
Creatinine, Ser: 0.82 mg/dL (ref 0.50–1.35)
GFR calc non Af Amer: >90 mL/min (ref >90)
Glucose, Bld: 172 mg/dL — ABNORMAL HIGH (ref 70–99)
Potassium: 4.8 mmol/L (ref 3.5–5.1)
Sodium: 136 mmol/L (ref 135–145)

## 2014-03-26 LAB — CBC
HCT: 33.6 % — ABNORMAL LOW (ref 39.0–52.0)
HEMOGLOBIN: 10.7 g/dL — AB (ref 13.0–17.0)
MCH: 27 pg (ref 26.0–34.0)
MCHC: 31.8 g/dL (ref 30.0–36.0)
MCV: 84.6 fL (ref 78.0–100.0)
PLATELETS: 377 10*3/uL (ref 150–400)
RBC: 3.97 MIL/uL — AB (ref 4.22–5.81)
RDW: 13.2 % (ref 11.5–15.5)
WBC: 13.8 10*3/uL — AB (ref 4.0–10.5)

## 2014-03-26 MED ORDER — PREDNISONE 10 MG PO TABS: ORAL_TABLET | ORAL | Status: DC

## 2014-03-26 MED ORDER — DOXYCYCLINE HYCLATE 100 MG PO TABS: 100.0000 mg | ORAL_TABLET | Freq: Two times a day (BID) | ORAL | Status: AC

## 2014-03-26 MED ORDER — IPRATROPIUM BROMIDE 0.02 % IN SOLN
RESPIRATORY_TRACT | Status: AC
  Filled 2014-03-26: qty 2.5

## 2014-03-26 MED ORDER — BISOPROLOL FUMARATE 10 MG PO TABS: 10.0000 mg | ORAL_TABLET | Freq: Every day | ORAL | Status: DC

## 2014-03-26 NOTE — Progress Notes (Signed)
Assessment unchanged. Pt verbalized understanding of dc instructions through teach back. Script given as appropriate by MD. Pt understands to call for an appointment with Dr. Delton CoombesByrum for the next week. [facility] Unable to make an appointment for pt any earlier with office. Dr. Catha GosselinMikhail aware. Pt discharged via wc to front entrance to meet friend and awaiting vehicle to carry home. Pt wore 4L O2 via tack to car then switched to own supply.

## 2014-03-26 NOTE — Care Management Note (Signed)
    Page 1 of 1   03/26/2014     12:30:26 PM CARE MANAGEMENT NOTE 03/26/2014  Patient:  [name],[name]   Account Number:  [id number]  Date Initiated:  03/26/2014  Documentation initiated by:  Lorenda IshiharaPEELE,Daja Shuping  Subjective/Objective Assessment:   49 yo male admitted with resp failure. PTA lived at home, O2 at 4l [place].     Action/Plan:   Home when stable   Anticipated DC Date:  03/26/2014   Anticipated DC Plan:  HOME W HOME HEALTH SERVICES      DC Planning Services  CM consult      Choice offered to / List presented to:             Status of service:  Completed, signed off Medicare Important Message given?   (If response is "NO", the following Medicare IM given date fields will be blank) Date Medicare IM given:   Medicare IM given by:   Date Additional Medicare IM given:   Additional Medicare IM given by:    Discharge Disposition:  HOME/SELF CARE  Per UR Regulation:  Reviewed for med. necessity/level of care/duration of stay  If discussed at Long Length of Stay Meetings, dates discussed:    Comments:  03-26-14 Lorenda IshiharaSuzanne Coree Riester RN CM [facility]

## 2014-03-26 NOTE — Discharge Instructions (Signed)
Bronchiectasis Bronchiectasis is a condition in which the airways (bronchi) are damaged and widened. This makes it difficult for the lungs to get rid of mucus. As a result, mucus gathers in the airways, and this often leads to lung infections. Infection can cause inflammation in the airways, which may further weaken and damage the bronchi.  CAUSES  Bronchiectasis may be present at birth (congenital) or may develop later in life. Sometimes there is no apparent cause. Some common causes include:  Cystic fibrosis.   Recurrent lung infections (such as pneumonia, tuberculosis, or fungal infections).  Foreign bodies or other blockages in the lungs.  Breathing in fluid, food, or other foreign objects (aspiration). SIGNS AND SYMPTOMS  Common symptoms include:  A daily cough that brings up mucus and lasts for more than 3 weeks.  Frequent lung infections (such as pneumonia, tuberculosis, or fungal infections).  Shortness of breath and wheezing.   Weakness and fatigue. DIAGNOSIS  Various tests may be done to help diagnose bronchiectasis. Tests may include:  Chest X-rays or CT scans.   Breathing tests to help determine how your lungs are working.   Sputum cultures to check for infection.   Blood tests and other tests to check for related diseases or causes, such as cystic fibrosis. TREATMENT  Treatment varies depending on the severity of the condition. Medicines may be given to loosen the mucus to be coughed up (expectorants), to relax the muscles of the air passages (bronchodilators), or to prevent or treat infections (antibiotics). Physical therapy methods may be recommended to help clear mucus from the lungs. For severe cases, surgery may be done to remove the affected part of the lung. HOME CARE INSTRUCTIONS   Get plenty of rest.   Only take over-the-counter or prescription medicines as directed by your health care provider. If antibiotic medicines were prescribed, take them as  directed. Finish them even if you start to feel better.  Avoid sedatives and antihistamines unless otherwise directed by your health care provider. These medicines tend to thicken the mucus in the lungs.   Perform any breathing exercises or techniques to clear the lungs as directed by your health care provider.  Drink enough fluids to keep your urine clear or pale yellow.  Consider using a cold steam vaporizer or humidifier in your room or home to help loosen secretions.   If the cough is worse at night, try sleeping in a semi-upright position in a recliner or using a couple of pillows.   Avoid cigarette smoke and lung irritants. If you smoke, quit.  Stay inside when pollution and ozone levels are high.   Stay current with vaccinations and immunizations.   Follow up with your health care provider as directed.  SEEK MEDICAL CARE IF:  You cough up more thick, discolored mucus (sputum) that is yellow to green in color.  You have a fever or persistent symptoms for more than 2-3 days.  You cannot control your cough and are losing sleep. SEEK IMMEDIATE MEDICAL CARE IF:   You cough up blood.   You have chest pain or increasing shortness of breath.   You have pain that is getting worse or is uncontrolled with medicines.   You have a fever and your symptoms suddenly get worse. MAKE SURE YOU:  Understand these instructions.   Will watch your condition.   Will get help right away if you are not doing well or get worse.  Document Released: 10/25/2006 Document Revised: 01/02/2013 Document Reviewed: 07/05/2012 [employer] Patient Information   2015 [employer]. This information is not intended to replace advice given to you by your health care provider. Make sure you discuss any questions you have with your health care provider.  

## 2014-03-26 NOTE — Discharge Summary (Signed)
Physician Discharge Summary  [name] NWG:[id number] DOB: 09/07/1965 DOA: 03/23/2014  PCP: PROVIDER NOT IN SYSTEM  Admit date: 03/23/2014 Discharge date: 03/26/2014  Time spent: 45 minutes  Recommendations for Outpatient Follow-up:  Patient will be discharged home. He should follow-up with his primary care physician as well as Dr. Delton CoombesByrum within 1-2 weeks of discharge. Patient to continue taking his medications as prescribe. Patient to follow a heart healthy diet. Patient may resume activity as tolerated.  Discharge Diagnoses:  Acute on chronic respiratory failure with hypoxia/acute exacerbation of bronchiectasis/sarcoidosis makes fine abdominal distention Leukocytosis Essential hypertension Obstructive sleep apnea Chronic normocytic anemia Hypokalemia/hyperkalemia  Discharge Condition: Stable  Diet recommendation: Heart healthy  Filed Weights   03/23/14 2300 03/25/14 0605  Weight: 82 kg (180 lb 12.4 oz) 83.8 kg (184 lb 11.9 oz)    History of present illness:  on 03/23/2014 by Dr. Therisa DoyneAnastassia Doutova Patient with hx of pulmonary sarcoidosis presents with worsening shortness of breath and cough. Reports some wheezing. He has hx of chronic respiratory failure on 4L of O 2 at baseline. Denies any sick contacts no fever. In ER he needed up to 6 L of oxygent to mantain his sats. He was given Decadron IV and continuous nebs with some improvement. His hypokalemia was treated. I [facility] who will try to see patient in consult Hospitalist was called for admission for sarcoidosis with bronchiectasis with exacerbation.   Hospital Course:  Acute on chronic respiratory failure with hypoxia/Acute exacerbation of bronchiectasis/Sarcoidosis -Resolved -Patient uses 4L home oxygen, required 6L upon admission as Oxygen sats were 86% -Patient currently back on 4L -patient currently afebrile  -CXR: Stable, patchy bilateral airspace opacification- sarcoidosis, small left pleural  effusion, no consolidation -Initially placed on doxycycline, symbicort, flonase, neb treatments, Solu-Medrol -patient given one dose of decadron at admission -Pulmonology consulted and appreciated, patient well known to Dr. Delton CoombesByrum -Will discharge patient with the remainder of his doxycycline regimen, prednisone taper -Continue home regimen upon discharge, albuterol, Symbicort, Flonase, Mucinex  Abdominal distention -Per patient, normal -Abdominal Xray: normal bowel gas pattern -Continue bowel regimen  Leukocytosis -Likely secondary to steroids -CXR negative, no urinary symptoms  Essential hypertension -Stable, continue bisoprolol, cardizem  Obstructive sleep apnea -Continue CPAP QHS  Chronic normocytic anemia -Basline hemoglobin 10-11, currently 10.7 -Continue iron supplementation  Hypokalemia/Hyperkalemia -Resolved  Procedures: None  Consultations: Pulmonology  Discharge Exam: Filed Vitals:   03/26/14 0544  BP: 121/83  Pulse: 84  Temp: 97.7 F (36.5 C)  Resp: 16   Exam  General: Well developed, well nourished, NAD  HEENT: NCAT, mucous membranes moist.   Cardiovascular: S1 S2 auscultated, RRR  Respiratory: Clear to auscultation   Abdomen: Nontender, distended, + bowel sounds  Neuro: AAOx3, nonfocal   Psych: Normal affect and demeanor   Discharge Instructions      Discharge Instructions    Discharge instructions    Complete by:  As directed   Patient will be discharged home. He should follow-up with his primary care physician as well as Dr. Delton CoombesByrum within 1-2 weeks of discharge. Patient to continue taking his medications as prescribe. Patient to follow a heart healthy diet. Patient may resume activity as tolerated.            Medication List    STOP taking these medications        furosemide 20 MG tablet  Commonly known as:  LASIX     HYDROcodone-homatropine 5-1.5 MG/5ML syrup  Commonly known as:  HYCODAN  metoprolol 50 MG tablet    Commonly known as:  LOPRESSOR     sodium chloride 0.65 % Soln nasal spray  Commonly known as:  OCEAN     traMADol 50 MG tablet  Commonly known as:  ULTRAM      TAKE these medications        albuterol (2.5 MG/3ML) 0.083% nebulizer solution  Commonly known as:  PROVENTIL  Take 2.5 mg by nebulization every 4 (four) hours as needed for wheezing or shortness of breath (wheezing and shortness of breath).     albuterol 108 (90 BASE) MCG/ACT inhaler  Commonly known as:  PROAIR HFA  Inhale 2 puffs into the lungs every 6 (six) hours as needed for wheezing or shortness of breath.     bisacodyl 5 MG EC tablet  Commonly known as:  bisacodyl  Take 1 tablet (5 mg total) by mouth daily as needed for moderate constipation.     bisoprolol 10 MG tablet  Commonly known as:  ZEBETA  Take 1 tablet (10 mg total) by mouth daily.     budesonide-formoterol 160-4.5 MCG/ACT inhaler  Commonly known as:  SYMBICORT  Inhale 2 puffs into the lungs 2 (two) times daily.     calcium-vitamin D 500-200 MG-UNIT per tablet  Commonly known as:  OSCAL WITH D  Take 1 tablet by mouth every morning.     cholecalciferol 1000 UNITS tablet  Commonly known as:  VITAMIN D  Take 1,000 Units by mouth every morning.     cyclobenzaprine 10 MG tablet  Commonly known as:  FLEXERIL  Take 10 mg by mouth 3 (three) times daily as needed for muscle spasms (muscle spasms).     diltiazem 120 MG 24 hr capsule  Commonly known as:  CARDIZEM CD  Take 1 capsule (120 mg total) by mouth daily.     diphenhydrAMINE 25 MG tablet  Commonly known as:  BENADRYL  Take 25 mg by mouth every 6 (six) hours as needed for allergies (allergies).     doxycycline 100 MG tablet  Commonly known as:  VIBRA-TABS  Take 1 tablet (100 mg total) by mouth every 12 (twelve) hours.     famotidine 20 MG tablet  Commonly known as:  PEPCID  Take 1 tablet (20 mg total) by mouth 2 (two) times daily.     ferrous sulfate 325 (65 FE) MG tablet  Take 1 tablet  (325 mg total) by mouth 2 (two) times daily with a meal.     fluticasone 50 MCG/ACT nasal spray  Commonly known as:  FLONASE  Place 2 sprays into both nostrils 2 (two) times daily.     guaiFENesin 600 MG 12 hr tablet  Commonly known as:  MUCINEX  Take 1,200 mg by mouth 2 (two) times daily as needed (For congestion.).     multivitamin with minerals tablet  Take 1 tablet by mouth every morning.     polyethylene glycol packet  Commonly known as:  MIRALAX / GLYCOLAX  Take 17 g by mouth daily.     predniSONE 10 MG tablet  Commonly known as:  DELTASONE  Take  daily for 3 days, then  daily for 3 days, then  daily for 3 days, then  daily for 3 days, then stop     promethazine 25 MG tablet  Commonly known as:  PHENERGAN  Take 25 mg by mouth every 6 (six) hours as needed for nausea (nausea).       Allergies  Allergen Reactions  .  Clindamycin/Lincomycin Anaphylaxis and Swelling    Made face and tongue swell   Follow-up Information    Follow up with Leslye Peer., MD. Schedule an appointment as soon as possible for a visit in 1 week.   Specialty:  Pulmonary Disease   Why:  Hospital follow-up   Contact information:   [address] [place] [address] [phone]        The results of significant diagnostics from this hospitalization (including imaging, microbiology, ancillary and laboratory) are listed below for reference.    Significant Diagnostic Studies: Dg Chest 2 View  03/23/2014   CLINICAL DATA:  Acute onset of shortness of breath. For history of sarcoidosis. Chest pain, cough and congestion. Initial encounter.  EXAM: CHEST  2 VIEW  COMPARISON:  Chest radiograph performed 02/11/2014  FINDINGS: Patchy bilateral airspace opacification is similar in appearance to the prior study and reflects the patient's known sarcoidosis. A small left pleural effusion is suspected. No pneumothorax is seen.  No superimposed focal airspace consolidation is identified.  The cardiomediastinal silhouette is normal in size. No acute osseous abnormalities are seen.  IMPRESSION: Stable appearance to patchy bilateral airspace opacification, reflecting the patient's known sarcoidosis. Small left pleural effusion suspected. No superimposed acute focal airspace consolidation seen.   Electronically Signed   By: Roanna Raider M.D.   On: 03/23/2014 19:50   Dg Abd 1 View  03/24/2014   CLINICAL DATA:  Abdominal distension, history hypertension, sarcoidosis, COPD  EXAM: ABDOMEN - 1 VIEW  COMPARISON:  Portable exam 0534 hr compared to 01/15/2014  FINDINGS: Normal bowel gas pattern.  No bowel dilatation, bowel wall thickening or evidence of obstruction.  Increased stool burden seen within colon on previous exam resolved.  Osseous structures unremarkable.  No urinary tract calcification.  IMPRESSION: Normal bowel gas pattern.   Electronically Signed   By: Ulyses Southward M.D.   On: 03/24/2014 07:28    Microbiology: Recent Results (from the past 240 hour(s))  MRSA PCR Screening     Status: None   Collection Time: 03/23/14 11:08 PM  Result Value Ref Range Status   MRSA by PCR NEGATIVE NEGATIVE Final    Comment:        The GeneXpert MRSA Assay (FDA approved for NASAL specimens only), is one component of a comprehensive MRSA colonization surveillance program. It is not intended to diagnose MRSA infection nor to guide or monitor treatment for MRSA infections. Performed at [facility]      Labs: Basic Metabolic Panel:  Recent Labs Lab 03/23/14 1919 03/23/14 1925 03/23/14 2359 03/24/14 0353 03/25/14 0412 03/26/14 0505  NA 140 139  --  139 138 136  K 3.2* 3.2*  --  4.1 5.2* 4.8  CL 103 101  --  103 100 98  CO2 27  --   --  25 29 29   GLUCOSE 129* 129*  --  202* 168* 172*  BUN 8 6  --  10 16 18   CREATININE 0.84 0.90 0.80 0.82 0.90 0.82  CALCIUM 9.1  --   --  9.3 10.0 9.5  MG  --   --   --  1.9  --   --   PHOS  --   --   --  2.3  --   --    Liver Function  Tests:  Recent Labs Lab 03/23/14 1919 03/24/14 0353  AST 26 31  ALT 16 17  ALKPHOS 91 89  BILITOT 0.8 0.6  PROT 7.3 7.4  ALBUMIN  3.9 3.8   No results for input(s): LIPASE, AMYLASE in the last 168 hours. No results for input(s): AMMONIA in the last 168 hours. CBC:  Recent Labs Lab 03/23/14 1925 03/23/14 2359 03/24/14 0353 03/25/14 0412 03/26/14 0505  WBC  --  7.6 6.6 13.5* 13.8*  NEUTROABS  --  7.1  --   --   --   HGB 11.6* 10.7* 10.8* 10.9* 10.7*  HCT 34.0* 33.5* 34.1* 34.4* 33.6*  MCV  --  83.8 84.6 85.1 84.6  PLT  --  253 287 324 377   Cardiac Enzymes: No results for input(s): CKTOTAL, CKMB, CKMBINDEX, TROPONINI in the last 168 hours. BNP: BNP (last 3 results)  Recent Labs  01/17/14 0805 01/23/14 0510 01/25/14 1100  BNP 492.9* 127.1* 98.1    ProBNP (last 3 results)  Recent Labs  08/22/13 1732 11/23/13 1458  PROBNP 59.2 97.5    CBG: No results for input(s): GLUCAP in the last 168 hours.     SignedEdsel Petrin  [facility] 03/26/2014, 9:48 AM

## 2014-03-27 NOTE — Telephone Encounter (Signed)
RB please advise. Thanks.  

## 2014-03-28 NOTE — Telephone Encounter (Signed)
Will send to Dr Delton CoombesByrum and Lillia AbedLindsay as Lorain ChildesFYI of patient's need for letter asap.

## 2014-03-28 NOTE — Telephone Encounter (Signed)
Pt cb, [phone] states he needs letter by Monday, his appt is Tuesday

## 2014-03-29 ENCOUNTER — Encounter: Payer: Self-pay | Admitting: *Deleted

## 2014-03-29 NOTE — Telephone Encounter (Signed)
Letter has been written. Pt is aware that this is ready to be picked up. Nothing further was needed.

## 2014-04-17 ENCOUNTER — Ambulatory Visit: Payer: 59 | Admitting: Adult Health

## 2014-04-22 ENCOUNTER — Ambulatory Visit: Payer: 59 | Admitting: Adult Health

## 2014-04-30 ENCOUNTER — Ambulatory Visit: Payer: 59 | Admitting: Adult Health

## 2014-05-09 ENCOUNTER — Inpatient Hospital Stay (HOSPITAL_COMMUNITY)
Admission: EM | Admit: 2014-05-09 | Discharge: 2014-05-13 | DRG: 190 | Disposition: A | Discharge status: Home / Self Care | Payer: 59 | Attending: Family Medicine | Admitting: Family Medicine

## 2014-05-09 ENCOUNTER — Encounter (HOSPITAL_COMMUNITY): Payer: Self-pay | Admitting: Emergency Medicine

## 2014-05-09 ENCOUNTER — Ambulatory Visit: Payer: 59 | Admitting: Emergency Medicine

## 2014-05-09 ENCOUNTER — Emergency Department (HOSPITAL_COMMUNITY): Payer: 59

## 2014-05-09 DIAGNOSIS — I10 Essential (primary) hypertension: Secondary | ICD-10-CM | POA: Diagnosis present

## 2014-05-09 DIAGNOSIS — Z881 Allergy status to other antibiotic agents status: Secondary | ICD-10-CM

## 2014-05-09 DIAGNOSIS — Z7952 Long term (current) use of systemic steroids: Secondary | ICD-10-CM

## 2014-05-09 DIAGNOSIS — R0602 Shortness of breath: Secondary | ICD-10-CM

## 2014-05-09 DIAGNOSIS — Z9989 Dependence on other enabling machines and devices: Secondary | ICD-10-CM

## 2014-05-09 DIAGNOSIS — G4733 Obstructive sleep apnea (adult) (pediatric): Secondary | ICD-10-CM | POA: Diagnosis present

## 2014-05-09 DIAGNOSIS — K219 Gastro-esophageal reflux disease without esophagitis: Secondary | ICD-10-CM | POA: Diagnosis present

## 2014-05-09 DIAGNOSIS — J9602 Acute respiratory failure with hypercapnia: Secondary | ICD-10-CM | POA: Diagnosis not present

## 2014-05-09 DIAGNOSIS — D869 Sarcoidosis, unspecified: Secondary | ICD-10-CM | POA: Diagnosis present

## 2014-05-09 DIAGNOSIS — T380X5A Adverse effect of glucocorticoids and synthetic analogues, initial encounter: Secondary | ICD-10-CM | POA: Diagnosis present

## 2014-05-09 DIAGNOSIS — Z79899 Other long term (current) drug therapy: Secondary | ICD-10-CM

## 2014-05-09 DIAGNOSIS — Z833 Family history of diabetes mellitus: Secondary | ICD-10-CM

## 2014-05-09 DIAGNOSIS — J441 Chronic obstructive pulmonary disease with (acute) exacerbation: Secondary | ICD-10-CM | POA: Diagnosis not present

## 2014-05-09 DIAGNOSIS — I5032 Chronic diastolic (congestive) heart failure: Secondary | ICD-10-CM | POA: Diagnosis present

## 2014-05-09 DIAGNOSIS — J9622 Acute and chronic respiratory failure with hypercapnia: Secondary | ICD-10-CM | POA: Diagnosis present

## 2014-05-09 DIAGNOSIS — Z9981 Dependence on supplemental oxygen: Secondary | ICD-10-CM

## 2014-05-09 LAB — BLOOD GAS, ARTERIAL
ACID-BASE EXCESS: 4.2 mmol/L — AB (ref 0.0–2.0)
BICARBONATE: 29.1 meq/L — AB (ref 20.0–24.0)
DRAWN BY: 331471
O2 Content: 3 L/min
O2 Saturation: 96.3 %
PCO2 ART: 47.2 mmHg — AB (ref 35.0–45.0)
PO2 ART: 90.9 mmHg (ref 80.0–100.0)
Patient temperature: 98.6
TCO2: 26.3 mmol/L (ref 0–100)
pH, Arterial: 7.407 (ref 7.350–7.450)

## 2014-05-09 LAB — CREATININE, SERUM
Creatinine, Ser: 1.01 mg/dL (ref 0.50–1.35)
GFR calc Af Amer: >90 mL/min (ref >90)
GFR calc non Af Amer: 86 mL/min — ABNORMAL LOW (ref >90)

## 2014-05-09 LAB — BASIC METABOLIC PANEL
Anion gap: 8 (ref 5–15)
BUN: 12 mg/dL (ref 6–23)
CO2: 27 mmol/L (ref 19–32)
Calcium: 9.7 mg/dL (ref 8.4–10.5)
Chloride: 106 mmol/L (ref 96–112)
Creatinine, Ser: 1.05 mg/dL (ref 0.50–1.35)
GFR calc Af Amer: >90 mL/min (ref >90)
GFR, EST NON AFRICAN AMERICAN: 82 mL/min — AB (ref >90)
GLUCOSE: 133 mg/dL — AB (ref 70–99)
POTASSIUM: 4.6 mmol/L (ref 3.5–5.1)
SODIUM: 141 mmol/L (ref 135–145)

## 2014-05-09 LAB — CBC
HCT: 39 % (ref 39.0–52.0)
Hemoglobin: 12.2 g/dL — ABNORMAL LOW (ref 13.0–17.0)
MCH: 26.8 pg (ref 26.0–34.0)
MCHC: 31.3 g/dL (ref 30.0–36.0)
MCV: 85.7 fL (ref 78.0–100.0)
Platelets: 338 10*3/uL (ref 150–400)
RBC: 4.55 MIL/uL (ref 4.22–5.81)
RDW: 13.5 % (ref 11.5–15.5)
WBC: 9.6 10*3/uL (ref 4.0–10.5)

## 2014-05-09 LAB — CBC WITH DIFFERENTIAL/PLATELET
BASOS PCT: 0 % (ref 0–1)
Basophils Absolute: 0 10*3/uL (ref 0.0–0.1)
Eosinophils Absolute: 0.3 10*3/uL (ref 0.0–0.7)
Eosinophils Relative: 4 % (ref 0–5)
HCT: 40.1 % (ref 39.0–52.0)
Hemoglobin: 12.6 g/dL — ABNORMAL LOW (ref 13.0–17.0)
LYMPHS PCT: 12 % (ref 12–46)
Lymphs Abs: 0.9 10*3/uL (ref 0.7–4.0)
MCH: 27.3 pg (ref 26.0–34.0)
MCHC: 31.4 g/dL (ref 30.0–36.0)
MCV: 86.8 fL (ref 78.0–100.0)
Monocytes Absolute: 0.6 10*3/uL (ref 0.1–1.0)
Monocytes Relative: 8 % (ref 3–12)
NEUTROS ABS: 5.9 10*3/uL (ref 1.7–7.7)
Neutrophils Relative %: 76 % (ref 43–77)
PLATELETS: 381 10*3/uL (ref 150–400)
RBC: 4.62 MIL/uL (ref 4.22–5.81)
RDW: 13.6 % (ref 11.5–15.5)
WBC: 7.8 10*3/uL (ref 4.0–10.5)

## 2014-05-09 LAB — I-STAT CG4 LACTIC ACID, ED: LACTIC ACID, VENOUS: 1.91 mmol/L (ref 0.5–2.0)

## 2014-05-09 LAB — I-STAT TROPONIN, ED: Troponin i, poc: 0 ng/mL (ref 0.00–0.08)

## 2014-05-09 LAB — TROPONIN I: Troponin I: <0.03 ng/mL (ref <0.031)

## 2014-05-09 MED ORDER — FAMOTIDINE 20 MG PO TABS
20.0000 mg | ORAL_TABLET | Freq: Two times a day (BID) | ORAL | Status: DC
  Administered 2014-05-09 – 2014-05-13 (×8): 20 mg via ORAL
  Filled 2014-05-09 (×8): qty 1

## 2014-05-09 MED ORDER — IPRATROPIUM BROMIDE 0.02 % IN SOLN
0.5000 mg | Freq: Once | RESPIRATORY_TRACT | Status: AC
Start: 2014-05-09 — End: 2014-05-09
  Filled 2014-05-09: qty 2.5

## 2014-05-09 MED ORDER — DIPHENHYDRAMINE HCL 25 MG PO TABS
25.0000 mg | ORAL_TABLET | Freq: Four times a day (QID) | ORAL | Status: DC | PRN
  Filled 2014-05-09: qty 1

## 2014-05-09 MED ORDER — VITAMIN D3 25 MCG (1000 UNIT) PO TABS
1000.0000 [IU] | ORAL_TABLET | Freq: Every morning | ORAL | Status: DC
  Administered 2014-05-10 – 2014-05-13 (×4): 1000 [IU] via ORAL
  Filled 2014-05-09 (×4): qty 1

## 2014-05-09 MED ORDER — CALCIUM CARBONATE-VITAMIN D 500-200 MG-UNIT PO TABS
1.0000 | ORAL_TABLET | Freq: Every morning | ORAL | Status: DC
  Administered 2014-05-10 – 2014-05-13 (×4): 1 via ORAL
  Filled 2014-05-09 (×4): qty 1

## 2014-05-09 MED ORDER — ALBUTEROL SULFATE (2.5 MG/3ML) 0.083% IN NEBU
2.5000 mg | INHALATION_SOLUTION | RESPIRATORY_TRACT | Status: DC | PRN
  Administered 2014-05-10: 2.5 mg via RESPIRATORY_TRACT
  Filled 2014-05-09: qty 3

## 2014-05-09 MED ORDER — CYCLOBENZAPRINE HCL 10 MG PO TABS
10.0000 mg | ORAL_TABLET | Freq: Three times a day (TID) | ORAL | Status: DC | PRN
  Administered 2014-05-10: 10 mg via ORAL
  Filled 2014-05-09: qty 1

## 2014-05-09 MED ORDER — SODIUM CHLORIDE 0.9 % IJ SOLN
3.0000 mL | Freq: Two times a day (BID) | INTRAMUSCULAR | Status: DC
  Administered 2014-05-09 – 2014-05-11 (×3): 3 mL via INTRAVENOUS

## 2014-05-09 MED ORDER — HEPARIN SODIUM (PORCINE) 5000 UNIT/ML IJ SOLN
5000.0000 [IU] | Freq: Three times a day (TID) | INTRAMUSCULAR | Status: DC
  Administered 2014-05-09 – 2014-05-13 (×11): 5000 [IU] via SUBCUTANEOUS
  Filled 2014-05-09 (×14): qty 1

## 2014-05-09 MED ORDER — BUDESONIDE-FORMOTEROL FUMARATE 160-4.5 MCG/ACT IN AERO
2.0000 | INHALATION_SPRAY | Freq: Two times a day (BID) | RESPIRATORY_TRACT | Status: DC
  Administered 2014-05-09 – 2014-05-13 (×8): 2 via RESPIRATORY_TRACT
  Filled 2014-05-09: qty 6

## 2014-05-09 MED ORDER — IPRATROPIUM BROMIDE 0.02 % IN SOLN
RESPIRATORY_TRACT | Status: AC
  Filled 2014-05-09: qty 2.5

## 2014-05-09 MED ORDER — ADULT MULTIVITAMIN W/MINERALS CH
1.0000 | ORAL_TABLET | Freq: Every day | ORAL | Status: DC
  Administered 2014-05-09 – 2014-05-13 (×5): 1 via ORAL
  Filled 2014-05-09 (×5): qty 1

## 2014-05-09 MED ORDER — SODIUM CHLORIDE 0.9 % IJ SOLN
3.0000 mL | Freq: Two times a day (BID) | INTRAMUSCULAR | Status: DC
  Administered 2014-05-09 – 2014-05-12 (×3): 3 mL via INTRAVENOUS

## 2014-05-09 MED ORDER — IPRATROPIUM BROMIDE 0.02 % IN SOLN
0.5000 mg | Freq: Once | RESPIRATORY_TRACT | Status: AC
  Administered 2014-05-09: 0.5 mg via RESPIRATORY_TRACT
  Filled 2014-05-09: qty 2.5

## 2014-05-09 MED ORDER — ALBUTEROL SULFATE (2.5 MG/3ML) 0.083% IN NEBU
5.0000 mg | INHALATION_SOLUTION | Freq: Once | RESPIRATORY_TRACT | Status: AC
  Administered 2014-05-09: 5 mg via RESPIRATORY_TRACT
  Filled 2014-05-09: qty 6

## 2014-05-09 MED ORDER — FERROUS SULFATE 325 (65 FE) MG PO TABS
325.0000 mg | ORAL_TABLET | Freq: Two times a day (BID) | ORAL | Status: DC
  Administered 2014-05-10 – 2014-05-13 (×7): 325 mg via ORAL
  Filled 2014-05-09 (×8): qty 1

## 2014-05-09 MED ORDER — FOLIC ACID 1 MG PO TABS
1.0000 mg | ORAL_TABLET | Freq: Every day | ORAL | Status: DC
  Administered 2014-05-09 – 2014-05-13 (×5): 1 mg via ORAL
  Filled 2014-05-09 (×5): qty 1

## 2014-05-09 MED ORDER — LEVOFLOXACIN IN D5W 750 MG/150ML IV SOLN
750.0000 mg | INTRAVENOUS | Status: DC
  Administered 2014-05-09 – 2014-05-10 (×2): 750 mg via INTRAVENOUS
  Filled 2014-05-09 (×2): qty 150

## 2014-05-09 MED ORDER — METHYLPREDNISOLONE SODIUM SUCC 125 MG IJ SOLR
60.0000 mg | Freq: Four times a day (QID) | INTRAMUSCULAR | Status: DC
  Administered 2014-05-09 – 2014-05-10 (×3): 60 mg via INTRAVENOUS
  Filled 2014-05-09 (×4): qty 0.96

## 2014-05-09 MED ORDER — MAGNESIUM SULFATE 2 GM/50ML IV SOLN
2.0000 g | Freq: Once | INTRAVENOUS | Status: AC
  Administered 2014-05-09: 2 g via INTRAVENOUS
  Filled 2014-05-09: qty 50

## 2014-05-09 MED ORDER — FLUTICASONE PROPIONATE 50 MCG/ACT NA SUSP
1.0000 | Freq: Every day | NASAL | Status: DC
  Administered 2014-05-09 – 2014-05-13 (×5): 1 via NASAL
  Filled 2014-05-09: qty 16

## 2014-05-09 MED ORDER — IPRATROPIUM BROMIDE 0.02 % IN SOLN
0.5000 mg | Freq: Four times a day (QID) | RESPIRATORY_TRACT | Status: DC
Start: 2014-05-09 — End: 2014-05-11
  Administered 2014-05-09 – 2014-05-11 (×7): 0.5 mg via RESPIRATORY_TRACT
  Filled 2014-05-09 (×6): qty 2.5

## 2014-05-09 MED ORDER — BISOPROLOL FUMARATE 10 MG PO TABS
10.0000 mg | ORAL_TABLET | Freq: Every day | ORAL | Status: DC
  Administered 2014-05-09 – 2014-05-13 (×5): 10 mg via ORAL
  Filled 2014-05-09 (×6): qty 1

## 2014-05-09 MED ORDER — PROMETHAZINE HCL 25 MG PO TABS: 25.0000 mg | ORAL_TABLET | Freq: Four times a day (QID) | ORAL | Status: DC | PRN

## 2014-05-09 MED ORDER — SODIUM CHLORIDE 0.9 % IJ SOLN: 3.0000 mL | INTRAMUSCULAR | Status: DC | PRN

## 2014-05-09 MED ORDER — VITAMIN B-1 100 MG PO TABS
100.0000 mg | ORAL_TABLET | Freq: Every day | ORAL | Status: DC
  Administered 2014-05-09 – 2014-05-13 (×5): 100 mg via ORAL
  Filled 2014-05-09 (×5): qty 1

## 2014-05-09 MED ORDER — DILTIAZEM HCL ER COATED BEADS 120 MG PO CP24
120.0000 mg | ORAL_CAPSULE | Freq: Every day | ORAL | Status: DC
  Administered 2014-05-09 – 2014-05-13 (×5): 120 mg via ORAL
  Filled 2014-05-09 (×5): qty 1

## 2014-05-09 MED ORDER — GUAIFENESIN ER 600 MG PO TB12
1200.0000 mg | ORAL_TABLET | Freq: Two times a day (BID) | ORAL | Status: DC | PRN
  Administered 2014-05-10 – 2014-05-13 (×5): 1200 mg via ORAL
  Filled 2014-05-09 (×7): qty 2

## 2014-05-09 MED ORDER — ALBUTEROL SULFATE (2.5 MG/3ML) 0.083% IN NEBU
5.0000 mg | INHALATION_SOLUTION | Freq: Once | RESPIRATORY_TRACT | Status: AC
  Filled 2014-05-09: qty 6

## 2014-05-09 MED ORDER — METHYLPREDNISOLONE SODIUM SUCC 125 MG IJ SOLR
125.0000 mg | Freq: Once | INTRAMUSCULAR | Status: AC
  Administered 2014-05-09: 125 mg via INTRAVENOUS
  Filled 2014-05-09: qty 2

## 2014-05-09 MED ORDER — ASPIRIN EC 81 MG PO TBEC
81.0000 mg | DELAYED_RELEASE_TABLET | Freq: Every day | ORAL | Status: DC
  Administered 2014-05-09 – 2014-05-13 (×5): 81 mg via ORAL
  Filled 2014-05-09 (×5): qty 1

## 2014-05-09 MED ORDER — ZOLPIDEM TARTRATE 5 MG PO TABS: 5.0000 mg | ORAL_TABLET | Freq: Every evening | ORAL | Status: DC | PRN

## 2014-05-09 MED ORDER — SODIUM CHLORIDE 0.9 % IV SOLN: 250.0000 mL | INTRAVENOUS | Status: DC | PRN

## 2014-05-09 NOTE — Progress Notes (Signed)
Security was notified of the gold cross that patient stated was missing. Security stated that they would make a report of the missing gold cross.

## 2014-05-09 NOTE — Progress Notes (Signed)
Texted attending MD to have POC discussion.

## 2014-05-09 NOTE — Progress Notes (Signed)
Patient called the nurse to the patient's room stating that he had 2 x gold crosses on his necklaces but now he doesn't, he said " I had an X-Ray and they took off the cross then...they didn't put it back".  The patient's girlfriend notified the RN to"call the police or security" because the gold cross is gone.  RN notified the radiology department and the E.D.

## 2014-05-09 NOTE — ED Notes (Signed)
Pt c/o increasing SOB over the past two days. Pt has hx of COPD and is normally on 4L O2. Pt also c/o chest pain, onset same time as SOB. Pt sts the chest pain is "all over" and worsens with palpation of chest and when taking deep breaths. Pt c/o dizziness and lightheadedness upon movement and ambulation. Pt Denies N/V, radiation of chest pain. A&Ox4.

## 2014-05-09 NOTE — ED Provider Notes (Addendum)
CSN: [id number]     Arrival date & time 05/09/14  1707 History   First MD Initiated Contact with Patient 05/09/14 1713     Chief Complaint  Patient presents with  . Shortness of Breath     (Consider location/radiation/quality/duration/timing/severity/associated sxs/prior Treatment) Patient is a 49 y.o. male presenting with shortness of breath. The history is provided by the patient.  Shortness of Breath Severity:  Severe Onset quality:  Gradual Duration:  2 days Timing:  Constant Progression:  Worsening Chronicity:  Recurrent Context: pollens   Relieved by:  Nothing Worsened by:  Movement, exertion and activity Ineffective treatments:  Inhaler Associated symptoms: cough and wheezing   Associated symptoms: no abdominal pain, no chest pain, no fever, no sputum production and no vomiting   Associated symptoms comment:  No swelling Risk factors comment:  Sarcoidosis, COPD   Past Medical History  Diagnosis Date  . Hypertension   . Sarcoidosis   . OSA on CPAP   . Chronic respiratory failure     home O@ 4L (01/28/14)  . Bronchitis   . Pneumonia   . COPD (chronic obstructive pulmonary disease)   . Neuromuscular disorder    Past Surgical History  Procedure Laterality Date  . Rotator cuff repair    . Arm surgery     . Video assisted thoracoscopy (vats)/thorocotomy Left 07/17/2012    Procedure: VIDEO ASSISTED THORACOSCOPY (VATS)/BLEB Stapling;  Surgeon: Alleen Borne, MD;  Location: [facility] OR;  Service: Thoracic;  Laterality: Left;   Family History  Problem Relation Age of Onset  . Diabetes Father    History  Substance Use Topics  . Smoking status: Never Smoker   . Smokeless tobacco: Never Used  . Alcohol Use: Yes     Comment: social    Review of Systems  Constitutional: Negative for fever.  Respiratory: Positive for cough, shortness of breath and wheezing. Negative for sputum production.   Cardiovascular: Negative for chest pain.  Gastrointestinal: Negative for vomiting  and abdominal pain.  All other systems reviewed and are negative.     Allergies  Clindamycin/lincomycin  Home Medications   Prior to Admission medications   Medication Sig Start Date End Date Taking? Authorizing Provider  albuterol (PROAIR HFA) 108 (90 BASE) MCG/ACT inhaler Inhale 2 puffs into the lungs every 6 (six) hours as needed for wheezing or shortness of breath. 03/28/13   Leslye Peer, MD  albuterol (PROVENTIL) (2.5 MG/3ML) 0.083% nebulizer solution Take 2.5 mg by nebulization every 4 (four) hours as needed for wheezing or shortness of breath (wheezing and shortness of breath).  06/08/12   Historical Provider, MD  bisacodyl (BISACODYL) 5 MG EC tablet Take 1 tablet (5 mg total) by mouth daily as needed for moderate constipation. 01/16/14   Simonne Martinet, NP  bisoprolol (ZEBETA) 10 MG tablet Take 1 tablet (10 mg total) by mouth daily. 03/26/14   Maryann Mikhail, DO  budesonide-formoterol (SYMBICORT) 160-4.5 MCG/ACT inhaler Inhale 2 puffs into the lungs 2 (two) times daily. 11/28/13   Jeanella Craze, NP  calcium-vitamin D (OSCAL WITH D) 500-200 MG-UNIT per tablet Take 1 tablet by mouth every morning.    Historical Provider, MD  cholecalciferol (VITAMIN D) 1000 UNITS tablet Take 1,000 Units by mouth every morning.     Historical Provider, MD  cyclobenzaprine (FLEXERIL) 10 MG tablet Take 10 mg by mouth 3 (three) times daily as needed for muscle spasms (muscle spasms).     Historical Provider, MD  diltiazem (CARDIZEM CD) 120  MG 24 hr capsule Take 1 capsule (120 mg total) by mouth daily. 01/28/14   Jeanella CrazeBrandi L Ollis, NP  diphenhydrAMINE (BENADRYL) 25 MG tablet Take 25 mg by mouth every 6 (six) hours as needed for allergies (allergies).     Historical Provider, MD  famotidine (PEPCID) 20 MG tablet Take 1 tablet (20 mg total) by mouth 2 (two) times daily. 03/08/12   Renae FickleMackenzie Short, MD  ferrous sulfate 325 (65 FE) MG tablet Take 1 tablet (325 mg total) by mouth 2 (two) times daily with a meal.  06/18/12   Leroy SeaPrashant K Singh, MD  fluticasone (FLONASE) 50 MCG/ACT nasal spray Place 2 sprays into both nostrils 2 (two) times daily. Patient taking differently: Place 1 spray into both nostrils daily.  01/28/14   Jeanella CrazeBrandi L Ollis, NP  guaiFENesin (MUCINEX) 600 MG 12 hr tablet Take 1,200 mg by mouth 2 (two) times daily as needed (For congestion.).     Historical Provider, MD  Multiple Vitamins-Minerals (MULTIVITAMIN WITH MINERALS) tablet Take 1 tablet by mouth every morning.     Historical Provider, MD  polyethylene glycol (MIRALAX / GLYCOLAX) packet Take 17 g by mouth daily. Patient taking differently: Take 17 g by mouth daily as needed for mild constipation (constipation).  01/16/14   Simonne MartinetPeter E Babcock, NP  predniSONE (DELTASONE) 10 MG tablet Take 40mg  daily for 3 days, then 30mg  daily for 3 days, then 20mg  daily for 3 days, then 10mg  daily for 3 days, then stop 03/26/14   Nita SellsMaryann Mikhail, DO  promethazine (PHENERGAN) 25 MG tablet Take 25 mg by mouth every 6 (six) hours as needed for nausea (nausea).  11/02/11   Historical Provider, MD   BP 148/107 mmHg  Pulse 125  Temp(Src) 98.5 F (36.9 C) (Oral)  Resp 24  SpO2 98% Physical Exam  Constitutional: He is oriented to person, place, and time. He appears well-developed and well-nourished. He appears distressed.  HENT:  Head: Normocephalic and atraumatic.  Mouth/Throat: Oropharynx is clear and moist.  Eyes: Conjunctivae and EOM are normal. Pupils are equal, round, and reactive to light.  Neck: Normal range of motion. Neck supple.  Cardiovascular: Regular rhythm and intact distal pulses.  Tachycardia present.   No murmur heard. Pulmonary/Chest: Accessory muscle usage present. Tachypnea noted. He is in respiratory distress. He has decreased breath sounds. He has no wheezes. He has no rales.  Abdominal: Soft. He exhibits no distension. There is no tenderness. There is no rebound and no guarding.  Musculoskeletal: Normal range of motion. He exhibits no  edema or tenderness.  Neurological: He is alert and oriented to person, place, and time.  Skin: Skin is warm and dry. No rash noted. No erythema.  Psychiatric: He has a normal mood and affect. His behavior is normal.  Nursing note and vitals reviewed.   ED Course  Procedures (including critical care time) Labs Review Labs Reviewed  CBC WITH DIFFERENTIAL/PLATELET - Abnormal; Notable for the following:    Hemoglobin 12.6 (*)    All other components within normal limits  BASIC METABOLIC PANEL - Abnormal; Notable for the following:    Glucose, Bld 133 (*)    GFR calc non Af Amer 82 (*)    All other components within normal limits  BLOOD GAS, ARTERIAL - Abnormal; Notable for the following:    pCO2 arterial 47.2 (*)    Bicarbonate 29.1 (*)    Acid-Base Excess 4.2 (*)    All other components within normal limits  I-STAT TROPOININ, ED  I-STAT  CG4 LACTIC ACID, ED    Imaging Review Dg Chest Port 1 View  05/09/2014   CLINICAL DATA:  Short of breath and chest pain.  EXAM: PORTABLE CHEST - 1 VIEW  COMPARISON:  Radiograph 03/23/2014  FINDINGS: Normal cardiac silhouette. There is bilateral pulmonary scarring and parenchymal thickening left which is similar to CT of 01/22/2014. Bullous change in the right upper lobe. Mild increased airspace densities in the mid left and right lung. These appear increased from 03/23/2014 and similar to 02/11/2014.  IMPRESSION: 1. Mild bilateral airspace disease superimposed on chronic pulmonary opacities and parenchymal scarring. 2. Bullous change in the right upper lobe.   Electronically Signed   By: Genevive Bi M.D.   On: 05/09/2014 17:36     EKG Interpretation   Date/Time:  Thursday May 09 2014 17:20:08 EDT Ventricular Rate:  124 PR Interval:  162 QRS Duration: 82 QT Interval:  313 QTC Calculation: 449 R Axis:   85 Text Interpretation:  Sinus tachycardia Consider right atrial enlargement  Artifact in lead(s) V3 V4 and baseline wander in lead(s)  III aVF No  significant change since last tracing Confirmed by Anitra Lauth  MD, Alphonzo Lemmings  [phone]) on 05/09/2014 6:18:06 PM      MDM   Final diagnoses:  SOB (shortness of breath)    Patient with significant history for sarcoidosis, COPD, chronic respiratory failure on 3 L of oxygen at home who presents today with approximately 2 days of worsening shortness of breath despite using his inhalers. He just finished a course of prednisone today. He denies any infectious etiology but continues to cough and cannot catch his breath. Patient denies any chest pain, lower extremity edema or symptoms concerning for CHF. On exam patient has globally decreased breath sounds with only minimal wheezing, retractions and tachypnea. On his home oxygen setting he was initially satting 89% and tachycardic in the 120s.  Chest x-ray without acute findings today. Patient started on albuterol, Atrovent, magnesium, Solu-Medrol.  Lactic acid within normal limits, troponin normal, CBC without acute findings. BMP pending.  6:49 PM Labs without any acute findings at this time. On reevaluation patient has greatly improved. Good aeration of the upper lungs with only scant wheezing. Tachypnea has resolved. Will give a second nebulized treatment and then attempt to walk patient to see if he is a candidate for discharge home.  8:38 PM When patient got up to go the bathroom his tachypnea, wheezing and shortness of breath returned. Given his extensive history feel that patient will need hospitalization for further treatment.  Gwyneth Sprout, MD 05/09/14 1850  Gwyneth Sprout, MD 05/09/14 2039

## 2014-05-09 NOTE — H&P (Signed)
[facility] History and Physical  [name] ZOX:[id number] DOB: Nov 24, 1965 DOA: 05/09/2014  Referring physician: Carolan Shiver, MD PCP: PROVIDER NOT IN SYSTEM   Chief Complaint: Shortness of Breath  HPI: [name] is a 49 y.o. male presents with increased shortness of breath. Patient has history of essential hypertension diastolic heart failure with an EF of 60-65% COPD on oxygen sleep apnea on CPAP and sarcoidosis. He states that he has been ill for the last 3-4 days. It primarily started as a shortness of breath. He thought initially it was pollen. He was having a cough and states he was bringing up yellow green. He also states there was some brown also in his sputum. He states that he also was having some change in his taste. He states that there were no fevers but he did feel chills. He has been on prednisone for the sarcoid for a flare up of sarcoid. He states he has been having some chest pain and tightness in his chest. He states he has had a headache no dizziness is noted. He has had some blood in his sputum. He has had some blood in his stool also. He has had no leg swelling but has felt his hands to be a little tight. He is on oxygen at home at 3lpm. Also has a history of OSA and is on CPAP   Review of Systems:  Constitutional:  No weight loss, night sweats, Fevers, +chills.  HEENT:  No headaches, no sneezing, +ear ache, +nasal congestion, no post nasal drip,  Cardio-vascular:  No chest pain, Orthopnea, PND, swelling in lower extremities  GI:  +heartburn, +indigestion, +nausea, no vomiting, +diarrhea Resp:  +shortness of breath with exertion and at rest. +productive cough, +coughing up of blood. +wheezing. Skin:  no rash or lesions. GU:  +hesitation, change in color of urine, no urgency or frequency.  Musculoskeletal:  +joint pain and swelling  Psych:  No change in mood or affect. No depression or anxiety   Past Medical History  Diagnosis Date  .  Hypertension   . Sarcoidosis   . OSA on CPAP   . Chronic respiratory failure     home O@ 4L (01/28/14)  . Bronchitis   . Pneumonia   . COPD (chronic obstructive pulmonary disease)   . Neuromuscular disorder    Past Surgical History  Procedure Laterality Date  . Rotator cuff repair    . Arm surgery     . Video assisted thoracoscopy (vats)/thorocotomy Left 07/17/2012    Procedure: VIDEO ASSISTED THORACOSCOPY (VATS)/BLEB Stapling;  Surgeon: Alleen Borne, MD;  Location: [facility] OR;  Service: Thoracic;  Laterality: Left;   Social History:  reports that he has never smoked. He has never used smokeless tobacco. He reports that he drinks alcohol. He reports that he does not use illicit drugs.  Allergies  Allergen Reactions  . Clindamycin/Lincomycin Anaphylaxis and Swelling    Made face and tongue swell    Family History  Problem Relation Age of Onset  . Diabetes Father      Prior to Admission medications   Medication Sig Start Date End Date Taking? Authorizing Provider  albuterol (PROAIR HFA) 108 (90 BASE) MCG/ACT inhaler Inhale 2 puffs into the lungs every 6 (six) hours as needed for wheezing or shortness of breath. 03/28/13  Yes Leslye Peer, MD  albuterol (PROVENTIL) (2.5 MG/3ML) 0.083% nebulizer solution Take 2.5 mg by nebulization every 4 (four) hours as needed for wheezing or shortness of breath (wheezing and shortness  of breath).  06/08/12  Yes Historical Provider, MD  bisacodyl (BISACODYL) 5 MG EC tablet Take 1 tablet (5 mg total) by mouth daily as needed for moderate constipation. 01/16/14  Yes Simonne MartinetPeter E Babcock, NP  bisoprolol (ZEBETA) 10 MG tablet Take 1 tablet (10 mg total) by mouth daily. 03/26/14  Yes Maryann Mikhail, DO  budesonide-formoterol (SYMBICORT) 160-4.5 MCG/ACT inhaler Inhale 2 puffs into the lungs 2 (two) times daily. 11/28/13  Yes Jeanella CrazeBrandi L Ollis, NP  calcium-vitamin D (OSCAL WITH D) 500-200 MG-UNIT per tablet Take 1 tablet by mouth every morning.   Yes Historical Provider,  MD  cholecalciferol (VITAMIN D) 1000 UNITS tablet Take 1,000 Units by mouth every morning.    Yes Historical Provider, MD  cyclobenzaprine (FLEXERIL) 10 MG tablet Take 10 mg by mouth 3 (three) times daily as needed for muscle spasms (muscle spasms).    Yes Historical Provider, MD  diltiazem (CARDIZEM CD) 120 MG 24 hr capsule Take 1 capsule (120 mg total) by mouth daily. 01/28/14  Yes Jeanella CrazeBrandi L Ollis, NP  diphenhydrAMINE (BENADRYL) 25 MG tablet Take 25 mg by mouth every 6 (six) hours as needed for allergies (allergies).    Yes Historical Provider, MD  famotidine (PEPCID) 20 MG tablet Take 1 tablet (20 mg total) by mouth 2 (two) times daily. 03/08/12  Yes Renae FickleMackenzie Short, MD  ferrous sulfate 325 (65 FE) MG tablet Take 1 tablet (325 mg total) by mouth 2 (two) times daily with a meal. 06/18/12  Yes Leroy SeaPrashant K Singh, MD  fluticasone (FLONASE) 50 MCG/ACT nasal spray Place 2 sprays into both nostrils 2 (two) times daily. Patient taking differently: Place 1 spray into both nostrils daily.  01/28/14  Yes Jeanella CrazeBrandi L Ollis, NP  guaiFENesin (MUCINEX) 600 MG 12 hr tablet Take 1,200 mg by mouth 2 (two) times daily as needed (For congestion.).    Yes Historical Provider, MD  Multiple Vitamins-Minerals (MULTIVITAMIN WITH MINERALS) tablet Take 1 tablet by mouth every morning.    Yes Historical Provider, MD  polyethylene glycol (MIRALAX / GLYCOLAX) packet Take 17 g by mouth daily. Patient not taking: Reported on 05/09/2014 01/16/14   Simonne MartinetPeter E Babcock, NP  predniSONE (DELTASONE) 10 MG tablet Take 40mg  daily for 3 days, then 30mg  daily for 3 days, then 20mg  daily for 3 days, then 10mg  daily for 3 days, then stop Patient not taking: Reported on 05/09/2014 03/26/14   Nita SellsMaryann Mikhail, DO  promethazine (PHENERGAN) 25 MG tablet Take 25 mg by mouth every 6 (six) hours as needed for nausea (nausea).  11/02/11   Historical Provider, MD   Physical Exam: Filed Vitals:   05/09/14 1722 05/09/14 1800  BP: 148/107 135/108  Pulse: 125 120    Temp: 98.5 F (36.9 C)   TempSrc: Oral   Resp: 24 12  SpO2: 98% 100%    Wt Readings from Last 3 Encounters:  03/25/14 83.8 kg (184 lb 11.9 oz)  02/11/14 83.462 kg (184 lb)  01/16/14 86.637 kg (191 lb)    General:  Appears calm and comfortable Eyes: PERRL, normal lids, irises & conjunctiva ENT: grossly normal hearing, lips & tongue Neck: no LAD, masses or thyromegaly Cardiovascular: RRR, no m/r/g. No LE edema. +tachycardia Respiratory: few ronchi bilaterally, Normal respiratory effort. Abdomen: soft, ntnd Skin: no rash or induration seen on limited exam Musculoskeletal: grossly normal tone BUE/BLE Psychiatric: grossly normal mood and affect Neurologic: grossly non-focal.          Labs on Admission:  Basic Metabolic Panel:  Recent  Labs Lab 05/09/14 1724  NA 141  K 4.6  CL 106  CO2 27  GLUCOSE 133*  BUN 12  CREATININE 1.05  CALCIUM 9.7   Liver Function Tests: No results for input(s): AST, ALT, ALKPHOS, BILITOT, PROT, ALBUMIN in the last 168 hours. No results for input(s): LIPASE, AMYLASE in the last 168 hours. No results for input(s): AMMONIA in the last 168 hours. CBC:  Recent Labs Lab 05/09/14 1724  WBC 7.8  NEUTROABS 5.9  HGB 12.6*  HCT 40.1  MCV 86.8  PLT 381   Cardiac Enzymes: No results for input(s): CKTOTAL, CKMB, CKMBINDEX, TROPONINI in the last 168 hours.  BNP (last 3 results)  Recent Labs  01/17/14 0805 01/23/14 0510 01/25/14 1100  BNP 492.9* 127.1* 98.1    ProBNP (last 3 results)  Recent Labs  08/22/13 1732 11/23/13 1458  PROBNP 59.2 97.5    CBG: No results for input(s): GLUCAP in the last 168 hours.  Radiological Exams on Admission: Dg Chest Port 1 View  05/09/2014   CLINICAL DATA:  Short of breath and chest pain.  EXAM: PORTABLE CHEST - 1 VIEW  COMPARISON:  Radiograph 03/23/2014  FINDINGS: Normal cardiac silhouette. There is bilateral pulmonary scarring and parenchymal thickening left which is similar to CT of  01/22/2014. Bullous change in the right upper lobe. Mild increased airspace densities in the mid left and right lung. These appear increased from 03/23/2014 and similar to 02/11/2014.  IMPRESSION: 1. Mild bilateral airspace disease superimposed on chronic pulmonary opacities and parenchymal scarring. 2. Bullous change in the right upper lobe.   Electronically Signed   By: Genevive Bi M.D.   On: 05/09/2014 17:36    EKG: Independently reviewed.  Assessment/Plan Active Problems:   Sarcoid   OSA on CPAP   Essential hypertension   COPD with acute exacerbation   Acute respiratory failure with hypercapnia   COPD with exacerbation   1. Acute respiratory failure with hypercapnia -will admit to telemetry -ABG reviewed no need for vent support at this time -will continue with oxygen therapy -aggressive bronchodilators  2. COPD with exacerbation -will continue with inhalers -oxygen therapy as ordered -started on IV steroids -empiric antibiotics ordered  3. Essential hypertension -will monitor pressures -continue with home medications  4. Obstructive Sleep Apnea -CPAP ordered  5. Sarcoidosis -presently stable -on steroids   Code Status: Full Code (must indicate code status--if unknown or must be presumed, indicate so) DVT Prophylaxis:heparin Family Communication: None (indicate person spoken with, if applicable, with phone number if by telephone) Disposition Plan: Home (indicate anticipated LOS)  Time spent:  [facility] Pager [phone]

## 2014-05-09 NOTE — Progress Notes (Signed)
ANTIBIOTIC CONSULT NOTE - INITIAL  Pharmacy Consult for Levaquin Indication: COPD  Allergies  Allergen Reactions  . Clindamycin/Lincomycin Anaphylaxis and Swelling    Made face and tongue swell    Patient Measurements: Height: 5' 7.5" (171.5 cm) Weight: 183 lb 14.4 oz (83.416 kg) IBW/kg (Calculated) : 67.25   Vital Signs: Temp: 98.4 F (36.9 C) (04/28 2145) Temp Source: Oral (04/28 2145) BP: 152/96 mmHg (04/28 2145) Pulse Rate: 112 (04/28 2145) Intake/Output from previous day:   Intake/Output from this shift:    Labs:  Recent Labs  05/09/14 1724  WBC 7.8  HGB 12.6*  PLT 381  CREATININE 1.05   Estimated Creatinine Clearance: 88.7 mL/min (by C-G formula based on Cr of 1.05). No results for input(s): VANCOTROUGH, VANCOPEAK, VANCORANDOM, GENTTROUGH, GENTPEAK, GENTRANDOM, TOBRATROUGH, TOBRAPEAK, TOBRARND, AMIKACINPEAK, AMIKACINTROU, AMIKACIN in the last 72 hours.   Microbiology: No results found for this or any previous visit (from the past 720 hour(s)).  Medical History: Past Medical History  Diagnosis Date  . Hypertension   . Sarcoidosis   . OSA on CPAP   . Chronic respiratory failure     home O@ 4L (01/28/14)  . Bronchitis   . Pneumonia   . COPD (chronic obstructive pulmonary disease)   . Neuromuscular disorder     Medications:  Prescriptions prior to admission  Medication Sig Dispense Refill Last Dose  . albuterol (PROAIR HFA) 108 (90 BASE) MCG/ACT inhaler Inhale 2 puffs into the lungs every 6 (six) hours as needed for wheezing or shortness of breath. 1 Inhaler 3 05/08/2014 at Unknown time  . albuterol (PROVENTIL) (2.5 MG/3ML) 0.083% nebulizer solution Take 2.5 mg by nebulization every 4 (four) hours as needed for wheezing or shortness of breath (wheezing and shortness of breath).    05/09/2014 at Unknown time  . bisacodyl (BISACODYL) 5 MG EC tablet Take 1 tablet (5 mg total) by mouth daily as needed for moderate constipation. 30 tablet 0 05/08/2014 at  Unknown time  . bisoprolol (ZEBETA) 10 MG tablet Take 1 tablet (10 mg total) by mouth daily. 30 tablet 0 05/08/2014 at 1000  . budesonide-formoterol (SYMBICORT) 160-4.5 MCG/ACT inhaler Inhale 2 puffs into the lungs 2 (two) times daily. 1 Inhaler 3 05/08/2014 at Unknown time  . calcium-vitamin D (OSCAL WITH D) 500-200 MG-UNIT per tablet Take 1 tablet by mouth every morning.   05/08/2014 at Unknown time  . cholecalciferol (VITAMIN D) 1000 UNITS tablet Take 1,000 Units by mouth every morning.    05/08/2014 at Unknown time  . cyclobenzaprine (FLEXERIL) 10 MG tablet Take 10 mg by mouth 3 (three) times daily as needed for muscle spasms (muscle spasms).    05/08/2014 at Unknown time  . diltiazem (CARDIZEM CD) 120 MG 24 hr capsule Take 1 capsule (120 mg total) by mouth daily. 30 capsule 3 05/08/2014 at Unknown time  . diphenhydrAMINE (BENADRYL) 25 MG tablet Take 25 mg by mouth every 6 (six) hours as needed for allergies (allergies).    05/08/2014 at Unknown time  . famotidine (PEPCID) 20 MG tablet Take 1 tablet (20 mg total) by mouth 2 (two) times daily. 60 tablet 1 05/08/2014 at Unknown time  . ferrous sulfate 325 (65 FE) MG tablet Take 1 tablet (325 mg total) by mouth 2 (two) times daily with a meal. 60 tablet 0 05/08/2014 at Unknown time  . fluticasone (FLONASE) 50 MCG/ACT nasal spray Place 2 sprays into both nostrils 2 (two) times daily. (Patient taking differently: Place 1 spray into both nostrils daily. )  16 g 2 05/08/2014 at Unknown time  . guaiFENesin (MUCINEX) 600 MG 12 hr tablet Take 1,200 mg by mouth 2 (two) times daily as needed (For congestion.).    Past Week at Unknown time  . Multiple Vitamins-Minerals (MULTIVITAMIN WITH MINERALS) tablet Take 1 tablet by mouth every morning.    05/08/2014 at Unknown time  . polyethylene glycol (MIRALAX / GLYCOLAX) packet Take 17 g by mouth daily. (Patient not taking: Reported on 05/09/2014) 14 each 0 Past Week at Unknown time  . predniSONE (DELTASONE) 10 MG tablet Take 40mg   daily for 3 days, then 30mg  daily for 3 days, then 20mg  daily for 3 days, then 10mg  daily for 3 days, then stop (Patient not taking: Reported on 05/09/2014) 30 tablet 0   . promethazine (PHENERGAN) 25 MG tablet Take 25 mg by mouth every 6 (six) hours as needed for nausea (nausea).    Unknown at Unknown time   Scheduled:  . aspirin EC  81 mg Oral Daily  . bisoprolol  10 mg Oral Daily  . budesonide-formoterol  2 puff Inhalation BID  . [START ON 05/10/2014] calcium-vitamin D  1 tablet Oral q morning - 10a  . [START ON 05/10/2014] cholecalciferol  1,000 Units Oral q morning - 10a  . diltiazem  120 mg Oral Daily  . famotidine  20 mg Oral BID  . [START ON 05/10/2014] ferrous sulfate  325 mg Oral BID WC  . fluticasone  1 spray Each Nare Daily  . folic acid  1 mg Oral Daily  . heparin  5,000 Units Subcutaneous 3 times per day  . ipratropium  0.5 mg Nebulization Q6H  . levofloxacin (LEVAQUIN) IV  750 mg Intravenous Q24H  . [START ON 05/10/2014] methylPREDNISolone (SOLU-MEDROL) injection  60 mg Intravenous Q6H  . multivitamin with minerals  1 tablet Oral Daily  . sodium chloride  3 mL Intravenous Q12H  . sodium chloride  3 mL Intravenous Q12H  . thiamine  100 mg Oral Daily   Infusions:   Assessment: 48 yoM c/o SOB.  Levaquin for COPD.  Goal of Therapy:  Treat infection  Plan:   Levaquin 750mg  IV q24h  F/u SCr  Lorenza EvangelistGreen, Aidah Forquer R 05/09/2014,9:59 PM

## 2014-05-09 NOTE — ED Notes (Signed)
Pt ambulated to restroom.   While in restroom, pt felt short of breath and moved the O2 up 1 liter.   While ambulating back to bed, pt showed signs of respiratory distress.  Pt was working to breath and blowing out of mouth.  When asked if he was ok, he responded fine.  Pt's vitals signs after ambulating were B/p 161/107, 126 pulse, 26 resp, 99 o2  4L.   After settling down  B/P 140/91, pulse 115, resp 14, O2 100 3L.

## 2014-05-09 NOTE — ED Notes (Signed)
Pt triaged by Crystelle Ferrufino McClurLytle Michaelse RN, not Mariea ClontsJacob Lennon, NT

## 2014-05-09 NOTE — Progress Notes (Addendum)
Presently the patient has 2x gold necklaces and 1 x gold cross. Also there is a gold ankle bracelet on the left ankle.  Patient stated that the gold cross he was missing is larger than the gold cross currently around his neck. The patient estimated the missing gold cross was approximately 1.5 inches.

## 2014-05-10 ENCOUNTER — Ambulatory Visit: Payer: 59 | Admitting: Adult Health

## 2014-05-10 ENCOUNTER — Other Ambulatory Visit: Payer: Self-pay

## 2014-05-10 DIAGNOSIS — R0602 Shortness of breath: Secondary | ICD-10-CM | POA: Diagnosis present

## 2014-05-10 DIAGNOSIS — T380X5A Adverse effect of glucocorticoids and synthetic analogues, initial encounter: Secondary | ICD-10-CM | POA: Diagnosis present

## 2014-05-10 DIAGNOSIS — J9622 Acute and chronic respiratory failure with hypercapnia: Secondary | ICD-10-CM | POA: Diagnosis present

## 2014-05-10 DIAGNOSIS — Z881 Allergy status to other antibiotic agents status: Secondary | ICD-10-CM | POA: Diagnosis not present

## 2014-05-10 DIAGNOSIS — Z833 Family history of diabetes mellitus: Secondary | ICD-10-CM | POA: Diagnosis not present

## 2014-05-10 DIAGNOSIS — K219 Gastro-esophageal reflux disease without esophagitis: Secondary | ICD-10-CM | POA: Diagnosis present

## 2014-05-10 DIAGNOSIS — Z9981 Dependence on supplemental oxygen: Secondary | ICD-10-CM | POA: Diagnosis not present

## 2014-05-10 DIAGNOSIS — I5032 Chronic diastolic (congestive) heart failure: Secondary | ICD-10-CM | POA: Diagnosis present

## 2014-05-10 DIAGNOSIS — J9602 Acute respiratory failure with hypercapnia: Secondary | ICD-10-CM | POA: Diagnosis not present

## 2014-05-10 DIAGNOSIS — J441 Chronic obstructive pulmonary disease with (acute) exacerbation: Secondary | ICD-10-CM | POA: Diagnosis present

## 2014-05-10 DIAGNOSIS — G4733 Obstructive sleep apnea (adult) (pediatric): Secondary | ICD-10-CM | POA: Diagnosis not present

## 2014-05-10 DIAGNOSIS — Z79899 Other long term (current) drug therapy: Secondary | ICD-10-CM | POA: Diagnosis not present

## 2014-05-10 DIAGNOSIS — Z7952 Long term (current) use of systemic steroids: Secondary | ICD-10-CM | POA: Diagnosis not present

## 2014-05-10 DIAGNOSIS — D869 Sarcoidosis, unspecified: Secondary | ICD-10-CM | POA: Diagnosis present

## 2014-05-10 DIAGNOSIS — I10 Essential (primary) hypertension: Secondary | ICD-10-CM | POA: Diagnosis present

## 2014-05-10 LAB — CBC
HCT: 38.3 % — ABNORMAL LOW (ref 39.0–52.0)
Hemoglobin: 11.8 g/dL — ABNORMAL LOW (ref 13.0–17.0)
MCH: 26.5 pg (ref 26.0–34.0)
MCHC: 30.8 g/dL (ref 30.0–36.0)
MCV: 85.9 fL (ref 78.0–100.0)
Platelets: 340 10*3/uL (ref 150–400)
RBC: 4.46 MIL/uL (ref 4.22–5.81)
RDW: 13.5 % (ref 11.5–15.5)
WBC: 7.3 10*3/uL (ref 4.0–10.5)

## 2014-05-10 LAB — COMPREHENSIVE METABOLIC PANEL
ALBUMIN: 4 g/dL (ref 3.5–5.2)
ALT: 23 U/L (ref 0–53)
ANION GAP: 10 (ref 5–15)
AST: 31 U/L (ref 0–37)
Alkaline Phosphatase: 94 U/L (ref 39–117)
BILIRUBIN TOTAL: 0.5 mg/dL (ref 0.3–1.2)
BUN: 16 mg/dL (ref 6–23)
CALCIUM: 9.8 mg/dL (ref 8.4–10.5)
CHLORIDE: 102 mmol/L (ref 96–112)
CO2: 27 mmol/L (ref 19–32)
Creatinine, Ser: 1.08 mg/dL (ref 0.50–1.35)
GFR calc Af Amer: >90 mL/min (ref >90)
GFR calc non Af Amer: 79 mL/min — ABNORMAL LOW (ref >90)
Glucose, Bld: 178 mg/dL — ABNORMAL HIGH (ref 70–99)
Potassium: 4.5 mmol/L (ref 3.5–5.1)
SODIUM: 139 mmol/L (ref 135–145)
Total Protein: 7.6 g/dL (ref 6.0–8.3)

## 2014-05-10 LAB — TSH: TSH: 0.558 u[IU]/mL (ref 0.350–4.500)

## 2014-05-10 LAB — GLUCOSE, CAPILLARY: Glucose-Capillary: 139 mg/dL — ABNORMAL HIGH (ref 70–99)

## 2014-05-10 LAB — TROPONIN I
Troponin I: <0.03 ng/mL (ref <0.031)
Troponin I: <0.03 ng/mL (ref <0.031)

## 2014-05-10 MED ORDER — ALBUTEROL SULFATE (2.5 MG/3ML) 0.083% IN NEBU
5.0000 mg | INHALATION_SOLUTION | Freq: Four times a day (QID) | RESPIRATORY_TRACT | Status: DC
  Administered 2014-05-10 – 2014-05-11 (×4): 5 mg via RESPIRATORY_TRACT
  Filled 2014-05-10 (×4): qty 6

## 2014-05-10 MED ORDER — HYDROCODONE-ACETAMINOPHEN 5-325 MG PO TABS
1.0000 | ORAL_TABLET | Freq: Once | ORAL | Status: AC
  Administered 2014-05-10: 1 via ORAL
  Filled 2014-05-10: qty 1

## 2014-05-10 MED ORDER — BENZONATATE 100 MG PO CAPS
100.0000 mg | ORAL_CAPSULE | Freq: Three times a day (TID) | ORAL | Status: DC | PRN
  Administered 2014-05-10 – 2014-05-12 (×4): 100 mg via ORAL
  Filled 2014-05-10 (×4): qty 1

## 2014-05-10 MED ORDER — NITROGLYCERIN 0.4 MG SL SUBL
SUBLINGUAL_TABLET | SUBLINGUAL | Status: AC
  Administered 2014-05-10: 0.4 mg
  Filled 2014-05-10: qty 1

## 2014-05-10 MED ORDER — METHYLPREDNISOLONE SODIUM SUCC 125 MG IJ SOLR
80.0000 mg | Freq: Four times a day (QID) | INTRAMUSCULAR | Status: DC
  Administered 2014-05-10 – 2014-05-12 (×7): 80 mg via INTRAVENOUS
  Filled 2014-05-10 (×9): qty 1.28

## 2014-05-10 MED ORDER — HYDROCODONE-ACETAMINOPHEN 5-325 MG PO TABS: 2.0000 | ORAL_TABLET | Freq: Once | ORAL | Status: DC

## 2014-05-10 MED ORDER — MORPHINE SULFATE 2 MG/ML IJ SOLN
2.0000 mg | Freq: Once | INTRAMUSCULAR | Status: AC
  Administered 2014-05-10: 2 mg via INTRAVENOUS
  Filled 2014-05-10 (×2): qty 1

## 2014-05-10 MED ORDER — ALBUTEROL SULFATE (2.5 MG/3ML) 0.083% IN NEBU: 5.0000 mg | INHALATION_SOLUTION | RESPIRATORY_TRACT | Status: DC | PRN

## 2014-05-10 NOTE — Progress Notes (Signed)
Pt requested to be placed on CPAP around 0100. RT will return at that time.

## 2014-05-10 NOTE — Progress Notes (Signed)
PT Cancellation Note  Patient Details Name: Andris Baumannommy Cantin MRN: [id number] DOB: 07/23/1965   Cancelled Treatment:    Reason Eval/Treat Not Completed: Attemtped PT eval, pt declined to participate at this time. Will check back as schedule permits. Thanks.    Rebeca AlertJannie Jaidyn Kuhl, MPT Pager: [phone]

## 2014-05-10 NOTE — Progress Notes (Signed)
Initial Nutrition Assessment  DOCUMENTATION CODES:  Not applicable  INTERVENTION:  Other (Comment) (Continue current diet and monitor for need for softer foods)  NUTRITION DIAGNOSIS:  Biting/chewing difficulty related to mouth pain as evidenced by per patient/family report.   GOAL:  Patient will meet greater than or equal to 90% of their needs   MONITOR:  PO intake, Labs, Weight trends, I & O's  REASON FOR ASSESSMENT: Consult for assessment of nutrition requirements and status  ASSESSMENT:  Pt is a 49 year old with medical hx of HTN, sarcoidosis OSA on CPAP, chronic respiratory failure, bronchitis, PNA, COPD, neuromuscular disorder.  Pt seen for consult and BMI indicates overweight status. Pt reports very good appetite PTA and now and that he recently gained 3 lbs which he attributes to Prednisone order recently starting. Pt reports for the few days PTA he ws biting his tongue when trying to chew food or talk which has caused pain and discomfort for him; will monitor for need for softer foods. He denies swallowing issues. Chart note indicated that pt had reported change in taste PTA, but when RD asked pt about this he denied this. He states he ate nearly all of pancakes and [place] sausage for breakfast.  Pt likely meeting needs PTA. Will monitor for needs. Labs and medications reviewed. Physical exam does not show any muscle or fat wasting or edema at this time.  Height:  Ht Readings from Last 1 Encounters:  05/09/14 5' 7.5" (1.715 m)    Weight:  Wt Readings from Last 1 Encounters:  05/10/14 183 lb 14.4 oz (83.416 kg)    Ideal Body Weight:  68.64 kg (kg)  Wt Readings from Last 10 Encounters:  05/10/14 183 lb 14.4 oz (83.416 kg)  03/25/14 184 lb 11.9 oz (83.8 kg)  02/11/14 184 lb (83.462 kg)  01/16/14 191 lb (86.637 kg)  12/11/13 183 lb 9.6 oz (83.28 kg)  11/28/13 188 lb 1.6 oz (85.322 kg)  10/12/13 181 lb 12.8 oz (82.464 kg)  07/27/13 162 lb (73.483 kg)   05/15/13 169 lb (76.658 kg)  03/28/13 180 lb (81.647 kg)    BMI:  Body mass index is 28.36 kg/(m^2).  Estimated Nutritional Needs:  Kcal:  1700-1900  Protein:  80-100 grams  Fluid:  2L/day  Skin:  WDL  Diet Order:  Diet Heart Room service appropriate?: Yes; Fluid consistency:: Thin  EDUCATION NEEDS:  No education needs identified at this time   Intake/Output Summary (Last 24 hours) at 05/10/14 1107 Last data filed at 05/09/14 2254  Gross per 24 hour  Intake    150 ml  Output      0 ml  Net    150 ml    Last BM:  PTA   Trenton GammonJessica Merrit Waugh, RD, LDN Inpatient Clinical Dietitian Pager # [phone] After hours/weekend pager # [phone]

## 2014-05-10 NOTE — Care Management Note (Signed)
    Page 1 of 1   05/10/2014     1:17:59 PM CARE MANAGEMENT NOTE 05/10/2014  Patient:  [name],[name]   Account Number:  [id number]  Date Initiated:  05/10/2014  Documentation initiated by:  Lanier ClamMAHABIR,Jevan Gaunt  Subjective/Objective Assessment:   49 y/o m admitted w/COPD.     Action/Plan:   From home w/spouse.Has home 02 3l continuous-[employer]-has travel portable in rm.Has cane, rw.   Anticipated DC Date:  05/11/2014   Anticipated DC Plan:  HOME/SELF CARE      DC Planning Services  CM consult      Choice offered to / List presented to:             Status of service:  In process, will continue to follow Medicare Important Message given?   (If response is "NO", the following Medicare IM given date fields will be blank) Date Medicare IM given:   Medicare IM given by:   Date Additional Medicare IM given:   Additional Medicare IM given by:    Discharge Disposition:    Per UR Regulation:  Reviewed for med. necessity/level of care/duration of stay  If discussed at Long Length of Stay Meetings, dates discussed:    Comments:  05/10/14 Lanier ClamKathy Lorian Yaun RN BSN NCM [phone] PT cons-await recommendations.

## 2014-05-10 NOTE — Progress Notes (Signed)
OT Cancellation Note  Patient Details Name: [name] MRN: [id number] DOB: 02/24/1965   Cancelled Treatment:    Reason Eval/Treat Not Completed: Other (comment) Pt requesting to rest as he reports he has not gotten much sleep. Will check back another time.  Lennox LaityStone, [name]  [phone] 05/10/2014, 11:23 AM

## 2014-05-10 NOTE — Progress Notes (Signed)
UR completed 

## 2014-05-10 NOTE — Progress Notes (Signed)
[name] from security came to tell the patient that a report was filed. Patient has calmed down and was more receptive to listening to [name]

## 2014-05-10 NOTE — Progress Notes (Signed)
Patient c/o muscle pain. PCP on call was notified.

## 2014-05-10 NOTE — Progress Notes (Signed)
[name] ZOX:[id number] DOB: 02-Dec-1965 DOA: 05/09/2014 PCP: PROVIDER NOT IN SYSTEM  Brief narrative: 49 y/o ? known COPD on chr O2--3 L, OSA on CPAP, Sarcoid diagnosed 2003-Dr. [facility]-recent Rx Prednisone 60 mg 2/7 days prior to admission from PCP.       Past medical history-As per Problem list Chart reviewed as below-  Consultants:  None yet  Procedures:  none  Antibiotics:  Levaquin   Subjective   Not much better. Tolerating diet fairly well. Wheezy still   Objective    Interim History:   Telemetry:    Objective: Filed Vitals:   05/10/14 0235 05/10/14 0453 05/10/14 0455 05/10/14 0909  BP:  147/92 131/94   Pulse:  95 91   Temp:  97.1 F (36.2 C) 97.6 F (36.4 C)   TempSrc:  Oral Oral   Resp:  24 16   Height:      Weight:  83.416 kg (183 lb 14.4 oz)    SpO2: 96% 99% 100% 99%    Intake/Output Summary (Last 24 hours) at 05/10/14 1305 Last data filed at 05/09/14 2254  Gross per 24 hour  Intake    150 ml  Output      0 ml  Net    150 ml    Exam:  General: eomi, ncat Cardiovascular: s1 s2 no m/r/g Respiratory:  Wheeze ++ audbile form bedside Abdomen: soft nt/Nd Skin soft LE, NT/ND  Neuro cn intact  Data Reviewed: Basic Metabolic Panel:  Recent Labs Lab 05/09/14 1724 05/09/14 2235 05/10/14 0359  NA 141  --  139  K 4.6  --  4.5  CL 106  --  102  CO2 27  --  27  GLUCOSE 133*  --  178*  BUN 12  --  16  CREATININE 1.05 1.01 1.08  CALCIUM 9.7  --  9.8   Liver Function Tests:  Recent Labs Lab 05/10/14 0359  AST 31  ALT 23  ALKPHOS 94  BILITOT 0.5  PROT 7.6  ALBUMIN 4.0   No results for input(s): LIPASE, AMYLASE in the last 168 hours. No results for input(s): AMMONIA in the last 168 hours. CBC:  Recent Labs Lab 05/09/14 1724 05/09/14 2235 05/10/14 0359  WBC 7.8 9.6 7.3  NEUTROABS 5.9  --   --   HGB 12.6* 12.2* 11.8*  HCT 40.1 39.0 38.3*  MCV 86.8 85.7 85.9  PLT 381 338 340   Cardiac Enzymes:  Recent  Labs Lab 05/09/14 2235 05/10/14 0359 05/10/14 1009  TROPONINI <0.03 <0.03 <0.03   BNP: Invalid input(s): POCBNP CBG:  Recent Labs Lab 05/10/14 0802  GLUCAP 139*    No results found for this or any previous visit (from the past 240 hour(s)).   Studies:              All Imaging reviewed and is as per above notation   Scheduled Meds: . albuterol  5 mg Nebulization Q6H  . aspirin EC  81 mg Oral Daily  . bisoprolol  10 mg Oral Daily  . budesonide-formoterol  2 puff Inhalation BID  . calcium-vitamin D  1 tablet Oral q morning - 10a  . cholecalciferol  1,000 Units Oral q morning - 10a  . diltiazem  120 mg Oral Daily  . famotidine  20 mg Oral BID  . ferrous sulfate  325 mg Oral BID WC  . fluticasone  1 spray Each Nare Daily  . folic acid  1 mg Oral Daily  . heparin  5,000 Units Subcutaneous 3 times per day  . ipratropium  0.5 mg Nebulization Q6H  . levofloxacin (LEVAQUIN) IV  750 mg Intravenous Q24H  . methylPREDNISolone (SOLU-MEDROL) injection  80 mg Intravenous Q6H  . multivitamin with minerals  1 tablet Oral Daily  . sodium chloride  3 mL Intravenous Q12H  . sodium chloride  3 mL Intravenous Q12H  . thiamine  100 mg Oral Daily   Continuous Infusions:    Assessment/Plan: Acute hypercarbic resp failure-multifactorial--Bronchiectasis + OSA + Sarcoid-steroid burst, ? solu-medrol 60-->80mg , Levaquin IV 4 bronchiectasis.   Sarcoid-as above-continue inhalers Htn-continue Bisoprolol 10 od, Cardizem 120 od Gerd-pepcid 20 bid  Code Status: Full Family Communication: None present at bedsdie Disposition Plan: inpatient pending resolution--will probably need 2-3 days of therapy to get better   Pleas KochJai Shamila Lerch, MD  [facility] Pager [phone] 05/10/2014, 1:05 PM

## 2014-05-11 ENCOUNTER — Encounter: Payer: Self-pay | Admitting: Family Medicine

## 2014-05-11 LAB — GLUCOSE, CAPILLARY: GLUCOSE-CAPILLARY: 205 mg/dL — AB (ref 70–99)

## 2014-05-11 LAB — HEMOGLOBIN A1C
HEMOGLOBIN A1C: 6.4 % — AB (ref 4.8–5.6)
MEAN PLASMA GLUCOSE: 137 mg/dL

## 2014-05-11 IMAGING — CR DG CHEST 2V
2 series · 2 of 2 positions shown · non-contrast
Comparison: 08/31/2012

CLINICAL DATA: Cough, congestion, sarcoidosis

EXAM:
CHEST  2 VIEW

[view not recorded (1 of 2)]
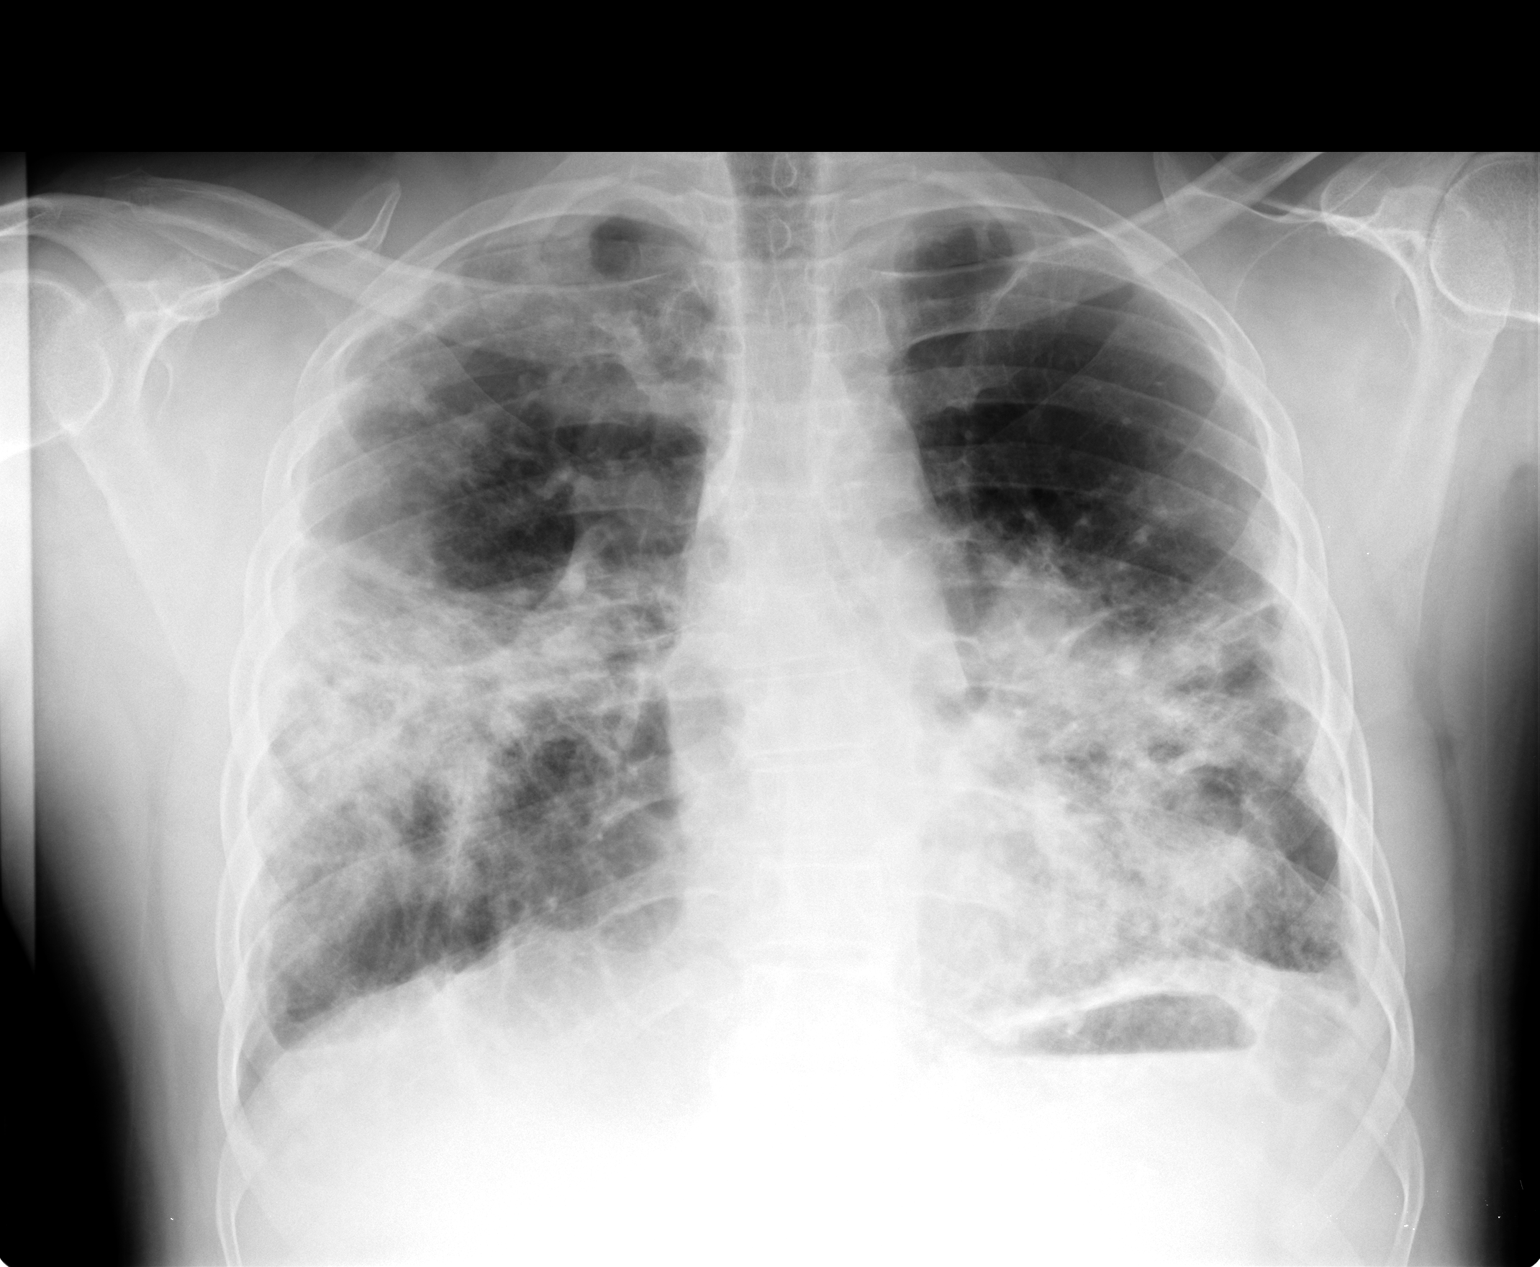

[view not recorded (2 of 2)]
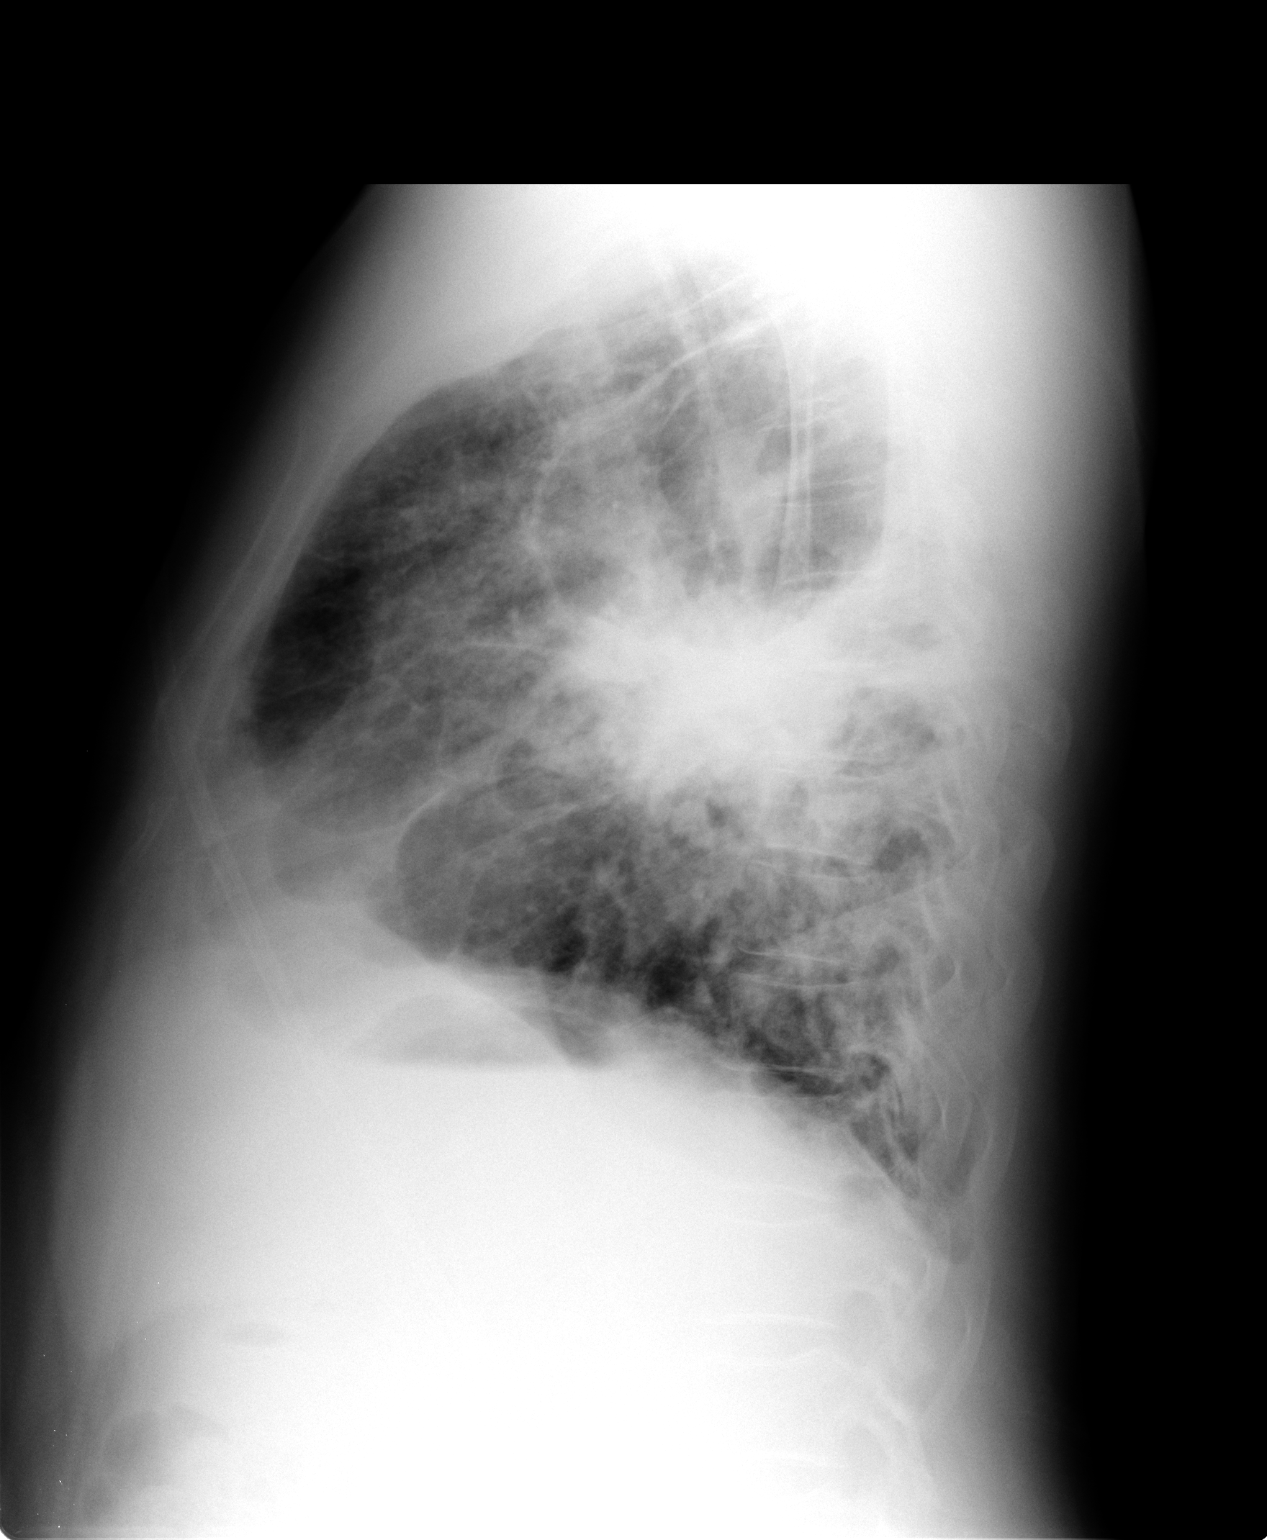

[2 of 2 positions shown; findings below may reference images not displayed]

FINDINGS: Cardiomediastinal silhouette is stable. Patchy bilateral airspace
disease is again noted without change in aeration. Stable large cyst
in the right upper lobe. Bony thorax is stable.
IMPRESSION: Bilateral airspace disease again noted without change in aeration.
Stable cyst in right upper lobe.

## 2014-05-11 MED ORDER — SALINE SPRAY 0.65 % NA SOLN
1.0000 | NASAL | Status: DC | PRN
Start: 2014-05-11 — End: 2014-05-13
  Administered 2014-05-11: 1 via NASAL
  Filled 2014-05-11: qty 44

## 2014-05-11 MED ORDER — LEVOFLOXACIN 750 MG PO TABS
750.0000 mg | ORAL_TABLET | Freq: Every day | ORAL | Status: DC
Start: 2014-05-11 — End: 2014-05-13
  Administered 2014-05-11 – 2014-05-12 (×2): 750 mg via ORAL
  Filled 2014-05-11 (×2): qty 1

## 2014-05-11 MED ORDER — IPRATROPIUM-ALBUTEROL 0.5-2.5 (3) MG/3ML IN SOLN
3.0000 mL | Freq: Four times a day (QID) | RESPIRATORY_TRACT | Status: DC
  Administered 2014-05-11 – 2014-05-13 (×8): 3 mL via RESPIRATORY_TRACT
  Filled 2014-05-11 (×8): qty 3

## 2014-05-11 NOTE — Progress Notes (Signed)
PHARMACIST - PHYSICIAN COMMUNICATION  CONCERNING: Antibiotic IV to Oral Route Change Policy  RECOMMENDATION: This patient is receiving levaquin by the intravenous route.  Based on criteria approved by the Pharmacy and Therapeutics Committee, the antibiotic(s) is/are being converted to the equivalent oral dose form(s).   DESCRIPTION: These criteria include:  Patient being treated for a respiratory tract infection, urinary tract infection, cellulitis or clostridium difficile associated diarrhea if on metronidazole  The patient is not neutropenic and does not exhibit a GI malabsorption state  The patient is eating (either orally or via tube) and/or has been taking other orally administered medications for a least 24 hours  The patient is improving clinically and has a Tmax < 100.5  If you have questions about this conversion, please contact the Pharmacy Department  []   [phone]( [phone] )  [facility] []   [phone] )  [facility]  []   [phone]( [phone] )  [facility] [x]   [phone]( [phone] )  [facility]

## 2014-05-11 NOTE — Progress Notes (Signed)
[name] EAV:[id number] DOB: 11-Feb-1965 DOA: 05/09/2014 PCP: PROVIDER NOT IN SYSTEM  Brief narrative: 49 y/o ? known COPD on chr O2--3 L, OSA on CPAP, Sarcoid diagnosed 2003-Dr. [facility]rum [facility]-recent Rx Prednisone 60 mg 2/7 days prior to admission from PCP.       Past medical history-As per Problem list Chart reviewed as below-  Consultants:  None yet  Procedures:  none  Antibiotics:  Levaquin   Subjective   Feels better Dyspnea improved Had cp in the L cp last pm.  No radiation No n/v NO sob No fever No chills   Objective    Interim History:   Telemetry:    Objective: Filed Vitals:   05/11/14 0115 05/11/14 0136 05/11/14 0829 05/11/14 0836  BP:      Pulse:  85    Temp:      TempSrc:      Resp:  22    Height:      Weight:      SpO2: 98% 99% 100% 100%    Intake/Output Summary (Last 24 hours) at 05/11/14 0920 Last data filed at 05/11/14 0100  Gross per 24 hour  Intake    150 ml  Output    550 ml  Net   -400 ml    Exam:  General: eomi, ncat Cardiovascular: s1 s2 no m/r/g Respiratory:  Wheeze is improved Abdomen: soft nt/Nd Skin soft LE, NT/ND  Neuro cn intact  Data Reviewed: Basic Metabolic Panel:  Recent Labs Lab 05/09/14 1724 05/09/14 2235 05/10/14 0359  NA 141  --  139  K 4.6  --  4.5  CL 106  --  102  CO2 27  --  27  GLUCOSE 133*  --  178*  BUN 12  --  16  CREATININE 1.05 1.01 1.08  CALCIUM 9.7  --  9.8   Liver Function Tests:  Recent Labs Lab 05/10/14 0359  AST 31  ALT 23  ALKPHOS 94  BILITOT 0.5  PROT 7.6  ALBUMIN 4.0   No results for input(s): LIPASE, AMYLASE in the last 168 hours. No results for input(s): AMMONIA in the last 168 hours. CBC:  Recent Labs Lab 05/09/14 1724 05/09/14 2235 05/10/14 0359  WBC 7.8 9.6 7.3  NEUTROABS 5.9  --   --   HGB 12.6* 12.2* 11.8*  HCT 40.1 39.0 38.3*  MCV 86.8 85.7 85.9  PLT 381 338 340   Cardiac Enzymes:  Recent Labs Lab 05/09/14 2235 05/10/14 0359  05/10/14 1009  TROPONINI <0.03 <0.03 <0.03   BNP: Invalid input(s): POCBNP CBG:  Recent Labs Lab 05/10/14 0802 05/11/14 0810  GLUCAP 139* 205*    No results found for this or any previous visit (from the past 240 hour(s)).   Studies:              All Imaging reviewed and is as per above notation   Scheduled Meds: . aspirin EC  81 mg Oral Daily  . bisoprolol  10 mg Oral Daily  . budesonide-formoterol  2 puff Inhalation BID  . calcium-vitamin D  1 tablet Oral q morning - 10a  . cholecalciferol  1,000 Units Oral q morning - 10a  . diltiazem  120 mg Oral Daily  . famotidine  20 mg Oral BID  . ferrous sulfate  325 mg Oral BID WC  . fluticasone  1 spray Each Nare Daily  . folic acid  1 mg Oral Daily  . heparin  5,000 Units Subcutaneous 3 times per  day  . HYDROcodone-acetaminophen  2 tablet Oral Once  . ipratropium-albuterol  3 mL Nebulization Q6H  . levofloxacin (LEVAQUIN) IV  750 mg Intravenous Q24H  . methylPREDNISolone (SOLU-MEDROL) injection  80 mg Intravenous Q6H  . multivitamin with minerals  1 tablet Oral Daily  . sodium chloride  3 mL Intravenous Q12H  . sodium chloride  3 mL Intravenous Q12H  . thiamine  100 mg Oral Daily   Continuous Infusions:    Assessment/Plan: Acute hypercarbic resp failure-multifactorial--Bronchiectasis + OSA + Sarcoid-steroid burst, ? solu-medrol 60-->80mg , Levaquin IV 4 bronchiectasis.     Sarcoid-as above-continue inhalers as above with scheduled inhalers.  Will need follow-up as OP with Dr. Delton CoombesByrum.  I will alert him to this admission Htn-continue Bisoprolol 10 od, Cardizem 120 od Gerd-pepcid 20 bid  Code Status: Full Family Communication: None present at bedsdie Disposition Plan: inpatient pending resolution--may shower, d/c tele.  Might be able to d/c home ~ 5/2   Pleas KochJai Daxx Tiggs, MD  [facility] Pager [phone] 05/11/2014, 9:20 AM    LOS: 1 day

## 2014-05-11 NOTE — Evaluation (Signed)
Physical Therapy Evaluation Patient Details Name: [name] MRN: [id number] [id number] DOB: 10/15/1965 Today's Date: 05/11/2014   History of Present Illness  pt was admitted for COPD exacerbation.  He uses 2-3 liters of 02 at baseline  Clinical Impression  Pt tolerated session well and knows a lot about pacing himself, etc. Education complete and no further needs for PT at this time. Pt would do well with ambulating with nursing staff when he feels able.     Follow Up Recommendations No PT follow up    Equipment Recommendations  None recommended by PT    Recommendations for Other Services       Precautions / Restrictions Precautions Precautions: None Restrictions Weight Bearing Restrictions: No      Mobility  Bed Mobility Overal bed mobility: Independent             General bed mobility comments: pt OOB  Transfers Overall transfer level: Independent                  Ambulation/Gait Ambulation/Gait assistance: Independent Ambulation Distance (Feet): 250 Feet (standing rest break half way due to dyspnea ) Assistive device: None Gait Pattern/deviations: WFL(Within Functional Limits)        Stairs            Wheelchair Mobility    Modified Rankin (Stroke Patients Only)       Balance                                             Pertinent Vitals/Pain Pain Assessment: No/denies pain    Home Living Family/patient expects to be discharged to:: Private residence Living Arrangements: Spouse/significant other (lives with his girl friend) Available Help at Discharge: Friend(s) Type of Home: House Home Access: Stairs to enter Entrance Stairs-Rails: None Secretary/administratorntrance Stairs-Number of Steps: 4   Home Equipment: Shower seat      Prior Function Level of Independence: Independent         Comments: pt uses and monitors his own O2 at home      Hand Dominance        Extremity/Trunk Assessment   Upper Extremity Assessment:  Overall WFL for tasks assessed           Lower Extremity Assessment: Overall WFL for tasks assessed (some dsypnea upon ambulayion , howevr pt aware of disease process and able to manage energy conservation and ambulation for his needs. )         Communication   Communication: No difficulties  Cognition Arousal/Alertness: Awake/alert Behavior During Therapy: WFL for tasks assessed/performed Overall Cognitive Status: Within Functional Limits for tasks assessed                      General Comments      Exercises Other Exercises Other Exercises: educated pt about improtance of pacing himself and planning ahead. Pt agrees, and educated about working large muscle groups such as his quads to help with perfusion as well. Educated with LAQ, and SLR in bed and in sitting and mini squats as well.       Assessment/Plan    PT Assessment Patent does not need any further PT services  PT Diagnosis Generalized weakness   PT Problem List    PT Treatment Interventions     PT Goals (Current goals can be found in the Care Plan section) Acute Rehab  PT Goals Patient Stated Goal: none stated PT Goal Formulation: All assessment and education complete, DC therapy    Frequency     Barriers to discharge        Co-evaluation               End of Session   Activity Tolerance: Patient tolerated treatment well Patient left: in chair Nurse Communication: Mobility status         Time: 1610-9604 PT Time Calculation (min) (ACUTE ONLY): 20 min   Charges:   PT Evaluation $Initial PT Evaluation Tier I: 1 Procedure     PT G CodesMarella Bile 09-Jun-2014, 5:44 PM Marella Bile, PT Pager: [phone] 06-09-14

## 2014-05-11 NOTE — Evaluation (Signed)
Occupational Therapy 1x Evaluation Patient Details Name: [name] Baumannommy Cendejas MRN: [id number] DOB: 01/28/1965 Today's Date: 05/11/2014    History of Present Illness pt was admitted for COPD exacerbation.  He uses 2-3 liters of 02 at baseline   Clinical Impression   This 49 year old man was admitted for the above.  Pt is at baseline of independent with ADLs.  Pt's 02 sats did not drop during OT evaluation.  Reviewed energy conservation.  No further OT is needed at this time.    Follow Up Recommendations  No OT follow up    Equipment Recommendations  None recommended by OT    Recommendations for Other Services       Precautions / Restrictions Precautions Precautions: None Restrictions Weight Bearing Restrictions: No      Mobility Bed Mobility               General bed mobility comments: pt OOB  Transfers Overall transfer level: Independent                    Balance                                            ADL Overall ADL's : Independent                                       General ADL Comments: pt is independent with adls, including retrieving items.  Ambulated to bathroom and practiced bathroom transfers.  Pt steady with no LOB.  Pt was on 4 liters of 02 and sats remained 100%.  Pt follows a lot of energy conservation strategies and he demonstrated pursed lip breathing.  Dyspnea 2/4--pt didn't think this was any different from baseline.  Pt reports that sometimes when he is thinking about something that is difficult, he gets SOB.  Encouraged him to slow down breathing, and make sure he isn't holding his breath.  If he feels anxious, encouraged him to break task down or think of next step.  He also reports that he does feel anxious if he gets behind when he has to do something.  Encouraged him to build in extra time to try to avoid this situation.      Vision     Perception     Praxis      Pertinent Vitals/Pain Pain  Assessment: No/denies pain     Hand Dominance     Extremity/Trunk Assessment Upper Extremity Assessment Upper Extremity Assessment: Overall WFL for tasks assessed           Communication Communication Communication: No difficulties   Cognition Arousal/Alertness: Awake/alert Behavior During Therapy: WFL for tasks assessed/performed Overall Cognitive Status: Within Functional Limits for tasks assessed                     General Comments       Exercises       Shoulder Instructions      Home Living Family/patient expects to be discharged to:: Private residence Living Arrangements: Spouse/significant other Available Help at Discharge:  (girlfriend)               Bathroom Shower/Tub: Producer, television/film/videoWalk-in shower   Bathroom Toilet: Standard     Home Equipment: Information systems managerhower seat  Prior Functioning/Environment Level of Independence: Independent             OT Diagnosis: Generalized weakness   OT Problem List:     OT Treatment/Interventions:      OT Goals(Current goals can be found in the care plan section) Acute Rehab OT Goals Patient Stated Goal: none stated  OT Frequency:     Barriers to D/C:            Co-evaluation              End of Session    Activity Tolerance: Patient tolerated treatment well Patient left: in chair;with call bell/phone within reach   Time: 1435-1500 OT Time Calculation (min): 25 min Charges:  OT General Charges $OT Visit: 1 Procedure OT Evaluation $Initial OT Evaluation Tier I: 1 Procedure G-Codes:    Lenin Kuhnle May 26, 2014, 3:51 PM Marica Otter, OTR/L [phone] 2014-05-26

## 2014-05-11 NOTE — Progress Notes (Signed)
Spoke with pt about a good time to come back to assist with cpap.  Pt stated he can place it on himself when ready for bed and feels comfortable doing so.  Cpap set on 10cm h2o per patient's home settings.  Pt prefers to use 4lnc underneath mask vs O2 bleedin.  Pt was advised that RT is available all night and encouraged him to call should he need further assistance.  Sterile water was added to humidity chamber.

## 2014-05-11 NOTE — Progress Notes (Signed)
RT placed pt on CPAP set at 10cmH2O per home settings with 4Lpm bled in via nasal cannula (pt preference) via ffm. Sterile water was added for humidification. Pt states he is comfortable and is tolerating CPAP well at this time. RT will continue to monitor as needed.

## 2014-05-12 LAB — COMPREHENSIVE METABOLIC PANEL
ALT: 24 U/L (ref 17–63)
AST: 41 U/L (ref 15–41)
Albumin: 3.4 g/dL — ABNORMAL LOW (ref 3.5–5.0)
Alkaline Phosphatase: 68 U/L (ref 38–126)
Anion gap: 7 (ref 5–15)
BILIRUBIN TOTAL: 0.8 mg/dL (ref 0.3–1.2)
BUN: 22 mg/dL — ABNORMAL HIGH (ref 6–20)
CHLORIDE: 102 mmol/L (ref 101–111)
CO2: 29 mmol/L (ref 22–32)
CREATININE: 1.09 mg/dL (ref 0.61–1.24)
Calcium: 9 mg/dL (ref 8.9–10.3)
GFR calc Af Amer: >60 mL/min (ref >60)
Glucose, Bld: 143 mg/dL — ABNORMAL HIGH (ref 70–99)
Potassium: 4.6 mmol/L (ref 3.5–5.1)
Sodium: 138 mmol/L (ref 135–145)
Total Protein: 6.7 g/dL (ref 6.5–8.1)

## 2014-05-12 LAB — CBC WITH DIFFERENTIAL/PLATELET
BASOS ABS: 0 10*3/uL (ref 0.0–0.1)
BASOS PCT: 0 % (ref 0–1)
EOS ABS: 0 10*3/uL (ref 0.0–0.7)
EOS PCT: 0 % (ref 0–5)
HCT: 35.4 % — ABNORMAL LOW (ref 39.0–52.0)
Hemoglobin: 11.1 g/dL — ABNORMAL LOW (ref 13.0–17.0)
Lymphocytes Relative: 2 % — ABNORMAL LOW (ref 12–46)
Lymphs Abs: 0.5 10*3/uL — ABNORMAL LOW (ref 0.7–4.0)
MCH: 26.6 pg (ref 26.0–34.0)
MCHC: 31.4 g/dL (ref 30.0–36.0)
MCV: 84.9 fL (ref 78.0–100.0)
Monocytes Absolute: 0.2 10*3/uL (ref 0.1–1.0)
Monocytes Relative: 1 % — ABNORMAL LOW (ref 3–12)
NEUTROS ABS: 20.5 10*3/uL — AB (ref 1.7–7.7)
NEUTROS PCT: 97 % — AB (ref 43–77)
PLATELETS: 320 10*3/uL (ref 150–400)
RBC: 4.17 MIL/uL — ABNORMAL LOW (ref 4.22–5.81)
RDW: 13.7 % (ref 11.5–15.5)
WBC: 21.2 10*3/uL — ABNORMAL HIGH (ref 4.0–10.5)

## 2014-05-12 LAB — GLUCOSE, CAPILLARY: GLUCOSE-CAPILLARY: 203 mg/dL — AB (ref 70–99)

## 2014-05-12 MED ORDER — PREDNISONE 50 MG PO TABS
60.0000 mg | ORAL_TABLET | Freq: Every day | ORAL | Status: DC
  Administered 2014-05-12 – 2014-05-13 (×2): 60 mg via ORAL
  Filled 2014-05-12 (×2): qty 1

## 2014-05-12 NOTE — Progress Notes (Signed)
[name] Baumannommy Flock ZOX:[id number] DOB: 07/18/1965 DOA: 05/09/2014 PCP: PROVIDER NOT IN SYSTEM  Brief narrative: 49 y/o ? known COPD on chr O2--3 L, OSA on CPAP, Sarcoid diagnosed 2003-Dr. [facility]-recent Rx Prednisone 60 mg 2/7 days prior to admission from PCP.   Was Rx with Iv steroids and eventually transitioned from these to oral steroids     Past medical history-As per Problem list Chart reviewed as below-  Consultants:  None yet  Procedures:  none  Antibiotics:  Levaquin   Subjective   Better Ambulatory x 3 and feels close to baseline No cp or n/v eating good    Objective    Interim History:   Telemetry:    Objective: Filed Vitals:   05/12/14 0142 05/12/14 0534 05/12/14 0823 05/12/14 1515  BP:  151/97    Pulse:  87    Temp:  97.8 F (36.6 C)    TempSrc:  Oral    Resp:  20    Height:      Weight:      SpO2: 99% 100% 100% 100%    Intake/Output Summary (Last 24 hours) at 05/12/14 1801 Last data filed at 05/12/14 0900  Gross per 24 hour  Intake    360 ml  Output      0 ml  Net    360 ml    Exam:  General: eomi, ncat Cardiovascular: s1 s2 no m/r/g Respiratory:  Wheeze is improved Abdomen: soft nt/Nd Skin soft LE, NT/ND  Neuro cn intact  Data Reviewed: Basic Metabolic Panel:  Recent Labs Lab 05/09/14 1724 05/09/14 2235 05/10/14 0359 05/12/14 0520  NA 141  --  139 138  K 4.6  --  4.5 4.6  CL 106  --  102 102  CO2 27  --  27 29  GLUCOSE 133*  --  178* 143*  BUN 12  --  16 22*  CREATININE 1.05 1.01 1.08 1.09  CALCIUM 9.7  --  9.8 9.0   Liver Function Tests:  Recent Labs Lab 05/10/14 0359 05/12/14 0520  AST 31 41  ALT 23 24  ALKPHOS 94 68  BILITOT 0.5 0.8  PROT 7.6 6.7  ALBUMIN 4.0 3.4*   No results for input(s): LIPASE, AMYLASE in the last 168 hours. No results for input(s): AMMONIA in the last 168 hours. CBC:  Recent Labs Lab 05/09/14 1724 05/09/14 2235 05/10/14 0359 05/12/14 0520  WBC 7.8 9.6 7.3 21.2*    NEUTROABS 5.9  --   --  20.5*  HGB 12.6* 12.2* 11.8* 11.1*  HCT 40.1 39.0 38.3* 35.4*  MCV 86.8 85.7 85.9 84.9  PLT 381 338 340 320   Cardiac Enzymes:  Recent Labs Lab 05/09/14 2235 05/10/14 0359 05/10/14 1009  TROPONINI <0.03 <0.03 <0.03   BNP: Invalid input(s): POCBNP CBG:  Recent Labs Lab 05/10/14 0802 05/11/14 0810 05/12/14 0728  GLUCAP 139* 205* 203*    No results found for this or any previous visit (from the past 240 hour(s)).   Studies:              All Imaging reviewed and is as per above notation   Scheduled Meds: . aspirin EC  81 mg Oral Daily  . bisoprolol  10 mg Oral Daily  . budesonide-formoterol  2 puff Inhalation BID  . calcium-vitamin D  1 tablet Oral q morning - 10a  . cholecalciferol  1,000 Units Oral q morning - 10a  . diltiazem  120 mg Oral Daily  . famotidine  20 mg Oral BID  . ferrous sulfate  325 mg Oral BID WC  . fluticasone  1 spray Each Nare Daily  . folic acid  1 mg Oral Daily  . heparin  5,000 Units Subcutaneous 3 times per day  . HYDROcodone-acetaminophen  2 tablet Oral Once  . ipratropium-albuterol  3 mL Nebulization Q6H  . levofloxacin  750 mg Oral QHS  . multivitamin with minerals  1 tablet Oral Daily  . predniSONE  60 mg Oral QAC breakfast  . sodium chloride  3 mL Intravenous Q12H  . sodium chloride  3 mL Intravenous Q12H  . thiamine  100 mg Oral Daily   Continuous Infusions:    Assessment/Plan: Acute hypercarbic resp failure-multifactorial--Bronchiectasis + OSA + Sarcoid-steroid burst, ? solu-medrol 60-->80mg .  Transitioned to PO prednisone 60 q d, Levaquin IV 4 bronchiectasis-->PO levaquin 05/12/14. As much improved,  If at baseline will d/c home 5/2    Sarcoid-as above-continue inhalers as above with scheduled inhalers.  Will need follow-up as OP with Dr. Delton Coombes.  I will alert him to this admission Htn-continue Bisoprolol 10 od, Cardizem 120 od Gerd-pepcid 20 bid  Code Status: Full Family Communication: None present at  bedside Disposition Plan: inpatient pending resolution--may shower, d/c tele.  Might be able to d/c home ~ 5/2   Pleas Koch, MD  [facility] Pager [phone] 05/12/2014, 6:01 PM    LOS: 2 days

## 2014-05-12 NOTE — Progress Notes (Addendum)
Agree with previous RN assesment. Will continue to monitor pt and reassess as necessary.  

## 2014-05-12 NOTE — Progress Notes (Signed)
Pt stated he did fine with cpap last night and can place it on himself again tonight.  Cpap on 10cm h2o per home settings.  Pt prefers to use 4lnc underneath mask vs O2 bleedin.  Pt was advised that RT is available all night should he need further assistance.

## 2014-05-13 LAB — GLUCOSE, CAPILLARY: Glucose-Capillary: 123 mg/dL — ABNORMAL HIGH (ref 70–99)

## 2014-05-13 MED ORDER — PREDNISONE 20 MG PO TABS: 60.0000 mg | ORAL_TABLET | Freq: Every day | ORAL | Status: DC

## 2014-05-13 MED ORDER — LEVOFLOXACIN 750 MG PO TABS: 750.0000 mg | ORAL_TABLET | Freq: Every day | ORAL | Status: DC

## 2014-05-13 NOTE — Discharge Summary (Signed)
Physician Discharge Summary  [name] BJY:[id number] DOB: 1965/10/13 DOA: 05/09/2014  PCP: PROVIDER NOT IN SYSTEM  Admit date: 05/09/2014 Discharge date: 05/13/2014  Time spent: 35 minutes  Recommendations for Outpatient Follow-up:  1. New Meds  Prednisone 60-no taper-given a 10 day burst-please ensure taper given dependant on how he does clinically  Levaquin 750 daily-stop date 05/16/13   Needs close OP pulmonary f/o-missed appt on 4/29 c Ms Parrett 2. Consider ACE levels/CT chest to determine Sarcoid-Last CT done 01/2014   Discharge Diagnoses:  Active Problems:   Sarcoid   OSA on CPAP   Essential hypertension   COPD with acute exacerbation   Acute respiratory failure with hypercapnia   COPD with exacerbation   Discharge Condition: good  Diet recommendation: HH low salt  Filed Weights   05/09/14 2145 05/10/14 0453  Weight: 83.416 kg (183 lb 14.4 oz) 83.416 kg (183 lb 14.4 oz)    History of present illness:  49 y/o ? known COPD on chr O2--3 L, OSA on CPAP, Sarcoid diagnosed 2003-Dr. [facility]-recent Rx Prednisone 60 mg 2/7 days prior to admission from PCP.  Was Rx with Iv steroids and eventually transitioned from these to oral steroids   Hospital Course:   1. Acute hypercarbic resp failure-multifactorial--Bronchiectasis + OSA + Sarcoid-steroid burst, ? solu-medrol 60-->80mg  but then  transitioned to PO prednisone 60 q d, Levaquin IV 4 bronchiectasis-->PO levaquin 05/12/14. As much improved, If at baseline will d/c home 5/2  2. Sarcoid-as above-continue inhalers as above with scheduled inhalers. Will need follow-up as OP with Dr. Delton Coombes. I will alert him to this admission-patient OP follow up to be scheduled with Ms.Parrett as OP 3. OSA-continue management as OP as per Pulmonary 4. LEukocytosis-no fever/chills.  2/2 to demargination from steroids 5. Htn-continue Bisoprolol 10 od, Cardizem 120 od 6. Gerd-pepcid 20 bid   Discharge Exam: Filed Vitals:   05/13/14 0905  BP:   Pulse: 97  Temp:   Resp: 22    General: eomi, ncat Cardiovascular: s1 s2 no m/r/g Respiratory: clear no added sound  Discharge Instructions   Discharge Instructions    Diet - low sodium heart healthy    Complete by:  As directed      Discharge instructions    Complete by:  As directed   You will be given a burst of steroids for a prolonged period--please contact Pulmonology for a follow up You will get 3-4 more days of oral levaquin Please obtain these prescriptions as an OP at your phamracy     Increase activity slowly    Complete by:  As directed           Current Discharge Medication List    START taking these medications   Details  levofloxacin (LEVAQUIN) 750 MG tablet Take 1 tablet (750 mg total) by mouth at bedtime. Qty: 3 tablet, Refills: 0      CONTINUE these medications which have CHANGED   Details  predniSONE (DELTASONE) 20 MG tablet Take 3 tablets (60 mg total) by mouth daily before breakfast. Qty: 30 tablet, Refills: 0      CONTINUE these medications which have NOT CHANGED   Details  albuterol (PROAIR HFA) 108 (90 BASE) MCG/ACT inhaler Inhale 2 puffs into the lungs every 6 (six) hours as needed for wheezing or shortness of breath. Qty: 1 Inhaler, Refills: 3    albuterol (PROVENTIL) (2.5 MG/3ML) 0.083% nebulizer solution Take 2.5 mg by nebulization every 4 (four) hours as needed for wheezing or shortness of  breath (wheezing and shortness of breath).     bisacodyl (BISACODYL) 5 MG EC tablet Take 1 tablet (5 mg total) by mouth daily as needed for moderate constipation. Qty: 30 tablet, Refills: 0    bisoprolol (ZEBETA) 10 MG tablet Take 1 tablet (10 mg total) by mouth daily. Qty: 30 tablet, Refills: 0    budesonide-formoterol (SYMBICORT) 160-4.5 MCG/ACT inhaler Inhale 2 puffs into the lungs 2 (two) times daily. Qty: 1 Inhaler, Refills: 3   Associated Diagnoses: Sarcoid    calcium-vitamin D (OSCAL WITH D) 500-200 MG-UNIT per tablet  Take 1 tablet by mouth every morning.    cholecalciferol (VITAMIN D) 1000 UNITS tablet Take 1,000 Units by mouth every morning.     cyclobenzaprine (FLEXERIL) 10 MG tablet Take 10 mg by mouth 3 (three) times daily as needed for muscle spasms (muscle spasms).     diltiazem (CARDIZEM CD) 120 MG 24 hr capsule Take 1 capsule (120 mg total) by mouth daily. Qty: 30 capsule, Refills: 3    diphenhydrAMINE (BENADRYL) 25 MG tablet Take 25 mg by mouth every 6 (six) hours as needed for allergies (allergies).     famotidine (PEPCID) 20 MG tablet Take 1 tablet (20 mg total) by mouth 2 (two) times daily. Qty: 60 tablet, Refills: 1    ferrous sulfate 325 (65 FE) MG tablet Take 1 tablet (325 mg total) by mouth 2 (two) times daily with a meal. Qty: 60 tablet, Refills: 0    fluticasone (FLONASE) 50 MCG/ACT nasal spray Place 2 sprays into both nostrils 2 (two) times daily. Qty: 16 g, Refills: 2    guaiFENesin (MUCINEX) 600 MG 12 hr tablet Take 1,200 mg by mouth 2 (two) times daily as needed (For congestion.).     Multiple Vitamins-Minerals (MULTIVITAMIN WITH MINERALS) tablet Take 1 tablet by mouth every morning.     polyethylene glycol (MIRALAX / GLYCOLAX) packet Take 17 g by mouth daily. Qty: 14 each, Refills: 0    promethazine (PHENERGAN) 25 MG tablet Take 25 mg by mouth every 6 (six) hours as needed for nausea (nausea).        Allergies  Allergen Reactions  . Clindamycin/Lincomycin Anaphylaxis and Swelling    Made face and tongue swell      The results of significant diagnostics from this hospitalization (including imaging, microbiology, ancillary and laboratory) are listed below for reference.    Significant Diagnostic Studies: Dg Chest Port 1 View  05/09/2014   CLINICAL DATA:  Short of breath and chest pain.  EXAM: PORTABLE CHEST - 1 VIEW  COMPARISON:  Radiograph 03/23/2014  FINDINGS: Normal cardiac silhouette. There is bilateral pulmonary scarring and parenchymal thickening left which is  similar to CT of 01/22/2014. Bullous change in the right upper lobe. Mild increased airspace densities in the mid left and right lung. These appear increased from 03/23/2014 and similar to 02/11/2014.  IMPRESSION: 1. Mild bilateral airspace disease superimposed on chronic pulmonary opacities and parenchymal scarring. 2. Bullous change in the right upper lobe.   Electronically Signed   By: Genevive BiStewart  Edmunds M.D.   On: 05/09/2014 17:36    Microbiology: No results found for this or any previous visit (from the past 240 hour(s)).   Labs: Basic Metabolic Panel:  Recent Labs Lab 05/09/14 1724 05/09/14 2235 05/10/14 0359 05/12/14 0520  NA 141  --  139 138  K 4.6  --  4.5 4.6  CL 106  --  102 102  CO2 27  --  27 29  GLUCOSE  133*  --  178* 143*  BUN 12  --  16 22*  CREATININE 1.05 1.01 1.08 1.09  CALCIUM 9.7  --  9.8 9.0   Liver Function Tests:  Recent Labs Lab 05/10/14 0359 05/12/14 0520  AST 31 41  ALT 23 24  ALKPHOS 94 68  BILITOT 0.5 0.8  PROT 7.6 6.7  ALBUMIN 4.0 3.4*   No results for input(s): LIPASE, AMYLASE in the last 168 hours. No results for input(s): AMMONIA in the last 168 hours. CBC:  Recent Labs Lab 05/09/14 1724 05/09/14 2235 05/10/14 0359 05/12/14 0520  WBC 7.8 9.6 7.3 21.2*  NEUTROABS 5.9  --   --  20.5*  HGB 12.6* 12.2* 11.8* 11.1*  HCT 40.1 39.0 38.3* 35.4*  MCV 86.8 85.7 85.9 84.9  PLT 381 338 340 320   Cardiac Enzymes:  Recent Labs Lab 05/09/14 2235 05/10/14 0359 05/10/14 1009  TROPONINI <0.03 <0.03 <0.03   BNP: BNP (last 3 results)  Recent Labs  01/17/14 0805 01/23/14 0510 01/25/14 1100  BNP 492.9* 127.1* 98.1    ProBNP (last 3 results)  Recent Labs  08/22/13 1732 11/23/13 1458  PROBNP 59.2 97.5    CBG:  Recent Labs Lab 05/10/14 0802 05/11/14 0810 05/12/14 0728 05/13/14 0746  GLUCAP 139* 205* 203* 123*       Signed:  Rhetta Mura  [facility] 05/13/2014, 9:41 AM

## 2014-05-13 NOTE — Care Management Note (Signed)
Case Management Note  Patient Details  Name: [name] MRN: [id number] Date of Birth: 12/30/1965  Subjective/Objective:    49 y/o admitted w/COPD                Action/Plan: From home.   Expected Discharge Date:   (unknown)               Expected Discharge Plan:  Home/Self Care  In-House Referral:     Discharge planning Services  CM Consult  Post Acute Care Choice:    Choice offered to:     DME Arranged:    DME Agency:     HH Arranged:    HH Agency:     Status of Service:  Completed, signed off  Medicare Important Message Given:    Date Medicare IM Given:    Medicare IM give by:    Date Additional Medicare IM Given:    Additional Medicare Important Message give by:     If discussed at Long Length of Stay Meetings, dates discussed:    Additional Comments:  Lanier ClamMahabir, Manika Hast, RN 05/13/2014, 1:11 PM

## 2014-05-21 ENCOUNTER — Encounter: Payer: Self-pay | Admitting: Emergency Medicine

## 2014-05-21 ENCOUNTER — Telehealth: Payer: Self-pay | Admitting: Emergency Medicine

## 2014-05-21 NOTE — Telephone Encounter (Signed)
Patient just got out of the hospital.  He said that he is feeling better now.  Patient takes classes online and needs a note letting his school know that he was in the hospital.  He would like to come by and pick up the letter today if possible.  RB - ok to write letter or does patient need to be seen for follow up first?

## 2014-05-21 NOTE — Telephone Encounter (Signed)
Ok to write a letter stating he was hospitalized for sarcoidosis and an acute exacerbation of COPD, and that he is cleared to go back to work now.

## 2014-05-21 NOTE — Telephone Encounter (Signed)
Called and spoke to pt. Informed pt of the letter. It has been placed up front for pick. Pt aware. Nothing further needed.

## 2014-05-23 ENCOUNTER — Ambulatory Visit (INDEPENDENT_AMBULATORY_CARE_PROVIDER_SITE_OTHER): Payer: 59 | Admitting: Adult Health

## 2014-05-23 ENCOUNTER — Telehealth: Payer: Self-pay | Admitting: Adult Health

## 2014-05-23 ENCOUNTER — Encounter: Payer: Self-pay | Admitting: Adult Health

## 2014-05-23 VITALS — BP 122/98 | HR 107 | Temp 98.7°F | Ht 67.0 in | Wt 181.8 lb

## 2014-05-23 DIAGNOSIS — D869 Sarcoidosis, unspecified: Secondary | ICD-10-CM

## 2014-05-23 DIAGNOSIS — J471 Bronchiectasis with (acute) exacerbation: Secondary | ICD-10-CM | POA: Diagnosis not present

## 2014-05-23 DIAGNOSIS — Z9989 Dependence on other enabling machines and devices: Secondary | ICD-10-CM

## 2014-05-23 DIAGNOSIS — J9611 Chronic respiratory failure with hypoxia: Secondary | ICD-10-CM | POA: Diagnosis not present

## 2014-05-23 DIAGNOSIS — G4733 Obstructive sleep apnea (adult) (pediatric): Secondary | ICD-10-CM

## 2014-05-23 MED ORDER — PREDNISONE 10 MG PO TABS: ORAL_TABLET | ORAL | Status: DC

## 2014-05-23 NOTE — Telephone Encounter (Signed)
Pt calling again about rx, he was seen today.Caren GriffinsStanley A Dalton

## 2014-05-23 NOTE — Patient Instructions (Addendum)
Begin Zyrtec and Pepcid 20mg  daily for 5 days .  Taper prednisone 50mg  for 3 days then 40mg  daily for 3 days , then 30mg  daily for 3 days  , 20mg  daily for 3 days then 10mg  daily -hold at this dose.  Chest xray today  Take symbicort 2 puffs Twice daily   Patient asssitance paperwork for symbicort Follow up Dr. Delton CoombesByrum  In 2-3 weeks and As needed   Please contact office for sooner follow up if symptoms do not improve or worsen or seek emergency care

## 2014-05-23 NOTE — Assessment & Plan Note (Signed)
Cont on O2 .  

## 2014-05-23 NOTE — Progress Notes (Signed)
   Subjective:    Patient ID: [name], male    DOB: 10/04/1965, 49 y.o.   MRN: [id number]  HPI 49 yo male never smoker with sarcoidosis and bronchiectasis and chronic respiratory failure with chronic hypoxemia needing oxygen and sleep apnea needing CPAP  05/23/2014 Post Hospital follow up  Pt returns for a post hospital follow up  Admitted 4/28-5/2 for AECOPD  tx w/ levaquin and steroids  Since discharge still coughing and sinus drainage  Some better. But has ear pressure, sinus stuffiness, .  Discharged on predinsone 60mg   daily -last dose yesterday.  On Symbicort , admits to missing some.  Pharmacy called not filling symbicort- last filled 11/2013  Tongue feels thick . No dysphagia, visual or speech changes. No arm weakness.   Wears CPAP each night for 6hr .  Admits to missing nights   No rash , chest pain, orthopnea or fever.  On 3 l/m O2.     Review of Systems Constitutional:   No  weight loss, night sweats,  Fevers, chills,  +fatigue, or  lassitude.  HEENT:   No headaches,  Difficulty swallowing,  Tooth/dental problems, or  Sore throat,                No sneezing, itching,  +ear ache, nasal congestion, post nasal drip,   CV:  No chest pain,  Orthopnea, PND, swelling in lower extremities, anasarca, dizziness, palpitations, syncope.   GI  No heartburn, indigestion, abdominal pain, nausea, vomiting, diarrhea, change in bowel habits, loss of appetite, bloody stools.   Resp:    No chest wall deformity  Skin: no rash or lesions.  GU: no dysuria, change in color of urine, no urgency or frequency.  No flank pain, no hematuria   MS:  No joint pain or swelling.  No decreased range of motion.  No back pain.  Psych:  No change in mood or affect. No depression or anxiety.  No memory loss.    ]    Objective:   Physical Exam GEN: A/Ox3; pleasant , NAD chronically ill appearing   HEENT:  [place]/AT,  EACs-clear, TMs-wnl, NOSE-clear, THROAT-clear, no lesions, no postnasal  drip or exudate noted. , class 3 airway , tongue midline and thick  No swelling or angioedema noted.    NECK:  Supple w/ fair ROM; no JVD; normal carotid impulses w/o bruits; no thyromegaly or nodules palpated; no lymphadenopathy. No stridor   RESP  Decreased BS in bases , no accessory muscle use, no dullness to percussion  CARD:  RRR, no m/r/g  , no peripheral edema, pulses intact, no cyanosis or clubbing.  GI:   Soft & nt; nml bowel sounds; no organomegaly or masses detected.  Musco: Warm bil, no deformities or joint swelling noted.   Neuro: alert, no focal deficits noted.    Skin: Warm, no lesions or rashes         Assessment & Plan:

## 2014-05-23 NOTE — Assessment & Plan Note (Addendum)
Recurrent exacerbation with frequent hospital admits  +Allergic rhinitis +/- GERD  cxr today   Plan  Begin Zyrtec and Pepcid 20mg  daily for 5 days .  Taper prednisone 50mg  for 3 days then 40mg  daily for 3 days , then 30mg  daily for 3 days  , 20mg  daily for 3 days then 10mg  daily -hold at this dose.  Chest xray today  Take symbicort 2 puffs Twice daily   Patient asssitance paperwork for symbicort Follow up Dr. Delton CoombesByrum  In 2-3 weeks and As needed   Please contact office for sooner follow up if symptoms do not improve or worsen or seek emergency care

## 2014-05-23 NOTE — Assessment & Plan Note (Signed)
Wear CPAP  Download requested.

## 2014-05-23 NOTE — Telephone Encounter (Signed)
Per 05/23/14 OV:  Patient Instructions       Begin Zyrtec and Pepcid 20mg  daily for 5 days .   Taper prednisone 50mg  for 3 days then 40mg  daily for 3 days , then 30mg  daily for 3 days  , 20mg  daily for 3 days then 10mg  daily -hold at this dose.   Chest xray today   Take symbicort 2 puffs Twice daily    Patient asssitance paperwork for symbicort Follow up Dr. Delton CoombesByrum  In 2-3 weeks and As needed    Please contact office for sooner follow up if symptoms do not improve or worsen or seek emergency care   ---  Called spoke with pt. Prednisone not called in. i have done so. Nothing further needed

## 2014-05-23 NOTE — Assessment & Plan Note (Signed)
Taper steroids

## 2014-05-23 NOTE — Telephone Encounter (Signed)
lmtcb for pt.  

## 2014-05-31 ENCOUNTER — Ambulatory Visit (INDEPENDENT_AMBULATORY_CARE_PROVIDER_SITE_OTHER)
Admission: RE | Admit: 2014-05-31 | Discharge: 2014-05-31 | Disposition: A | Discharge status: Home / Self Care | Payer: 59 | Source: Ambulatory Visit | Attending: Adult Health | Admitting: Adult Health

## 2014-05-31 DIAGNOSIS — J471 Bronchiectasis with (acute) exacerbation: Secondary | ICD-10-CM

## 2014-05-31 DIAGNOSIS — D869 Sarcoidosis, unspecified: Secondary | ICD-10-CM | POA: Diagnosis not present

## 2014-05-31 DIAGNOSIS — J9611 Chronic respiratory failure with hypoxia: Secondary | ICD-10-CM | POA: Diagnosis not present

## 2014-06-03 NOTE — Progress Notes (Signed)
Quick Note:  Called and spoke to patient. Reviewed results and recs. Pt voiced understanding and had no further questions. ______ 

## 2014-06-13 ENCOUNTER — Ambulatory Visit: Payer: 59 | Admitting: Emergency Medicine

## 2014-06-17 ENCOUNTER — Ambulatory Visit (INDEPENDENT_AMBULATORY_CARE_PROVIDER_SITE_OTHER): Payer: 59 | Admitting: Emergency Medicine

## 2014-06-17 ENCOUNTER — Encounter: Payer: Self-pay | Admitting: Emergency Medicine

## 2014-06-17 VITALS — BP 120/80 | HR 106 | Ht 67.5 in | Wt 181.0 lb

## 2014-06-17 DIAGNOSIS — J471 Bronchiectasis with (acute) exacerbation: Secondary | ICD-10-CM

## 2014-06-17 DIAGNOSIS — G4733 Obstructive sleep apnea (adult) (pediatric): Secondary | ICD-10-CM | POA: Diagnosis not present

## 2014-06-17 DIAGNOSIS — Z9989 Dependence on other enabling machines and devices: Principal | ICD-10-CM

## 2014-06-17 NOTE — Progress Notes (Signed)
   Subjective:    Patient ID: [name], male    DOB: 01/25/1965, 49 y.o.   MRN: [id number]  HPI 49 yo male never smoker with sarcoidosis and bronchiectasis and chronic respiratory failure with chronic hypoxemia needing oxygen and sleep apnea needing CPAP  ROV 10/12/13 -- hx of sarcoidosis, bronchiectasis and R cavitary disease, OSA back on CPAP. Was seen by [facility] in May and [facility] as above. He has been treated for AE on multiple occasions since our last visit (more on than off). Returns for f/u.   ROV / Hospital f/u 12/11/13 -- hx of sarcoidosis, bronchiectasis and R cavitary disease, OSA back on CPAP. Currently on symbicort + albuterol averages 2-3x a day. Exacerbates frequently. He is having bloating that causes restriction and limits his breathing. Has several BM a day, sometimes diarrhea  Post Hospital follow up 05/23/14 --  Pt returns for a post hospital follow up  Admitted 4/28-5/2 for AECOPD  tx w/ levaquin and steroids  Since discharge still coughing and sinus drainage  Some better. But has ear pressure, sinus stuffiness, .  Discharged on predinsone 60mg   daily -last dose yesterday.  On Symbicort , admits to missing some.  Pharmacy called not filling symbicort- last filled 11/2013  Tongue feels thick . No dysphagia, visual or speech changes. No arm weakness.   Wears CPAP each night for 6hr .  Admits to missing nights   No rash , chest pain, orthopnea or fever.  On 3 l/m O2.   ROV 06/17/14 -- follow-up visit for chronic respiratory failure in the setting of sarcoidosis, bronchiectasis and obstructive sleep apnea. He has ups and downs with his exertional tolerance. He has thick mucous, some difficulty clearing secretions.  He is off prednisone. Taking mucinex bid. He uses flutter some days. Has used SABA more freq, mutliple times every day. Able to walk up stairs without stopping.    Review of Systems As per HPI  ]    Objective:   Physical Exam  Filed Vitals:   06/17/14 1527  BP: 120/80  Pulse: 106  Height: 5' 7.5" (1.715 m)  Weight: 181 lb (82.101 kg)  SpO2: 98%    GEN: A/Ox3; pleasant , NAD chronically ill appearing   HEENT:  OP clear, no erythema  NECK:  No stridor  RESP  Decreased BS in bases , no accessory muscle use, no dullness to percussion  CARD:  RRR, no m/r/g  , no peripheral edema, pulses intact, no cyanosis or clubbing.  Musco: Warm bil, no deformities or joint swelling noted.   Neuro: alert, no focal deficits noted.    Skin: Warm, no lesions or rashes     Assessment & Plan:  OSA on CPAP Continue CPAP   Bronchiectasis without acute exacerbation Continues to have daily symptoms but for the most part is compensated. He has trouble clearing secretions and we discussed drinking lots of fluids in addition to taking his Mucinex. He does not use M every day and we discussed increasing frequency. Continue his current bronchodilators and follow him frequently to try and head off any exacerbations.

## 2014-06-17 NOTE — Assessment & Plan Note (Signed)
Continue CPAP.  

## 2014-06-17 NOTE — Assessment & Plan Note (Signed)
Continues to have daily symptoms but for the most part is compensated. He has trouble clearing secretions and we discussed drinking lots of fluids in addition to taking his Mucinex. He does not use M every day and we discussed increasing frequency. Continue his current bronchodilators and follow him frequently to try and head off any exacerbations.

## 2014-06-17 NOTE — Patient Instructions (Signed)
Please continue your current medications have been taking them Follow with Dr Delton CoombesByrum in 2 months or sooner if you have any problems.

## 2014-06-19 ENCOUNTER — Telehealth: Payer: Self-pay | Admitting: Emergency Medicine

## 2014-06-19 MED ORDER — GUAIFENESIN ER 600 MG PO TB12: 600.0000 mg | ORAL_TABLET | Freq: Two times a day (BID) | ORAL | Status: DC | PRN

## 2014-06-19 NOTE — Telephone Encounter (Signed)
Pt reports he needs his mucinex called into the pharmacy to get his insurance to cover. Per RB okay to do so. Nothing further needed

## 2014-07-26 IMAGING — CR DG CHEST 2V
2 series · 2 of 2 positions shown · non-contrast
Comparison: 02/28/2013

CLINICAL DATA: Sarcoidosis

EXAM:
CHEST  2 VIEW

[view not recorded (1 of 2)]
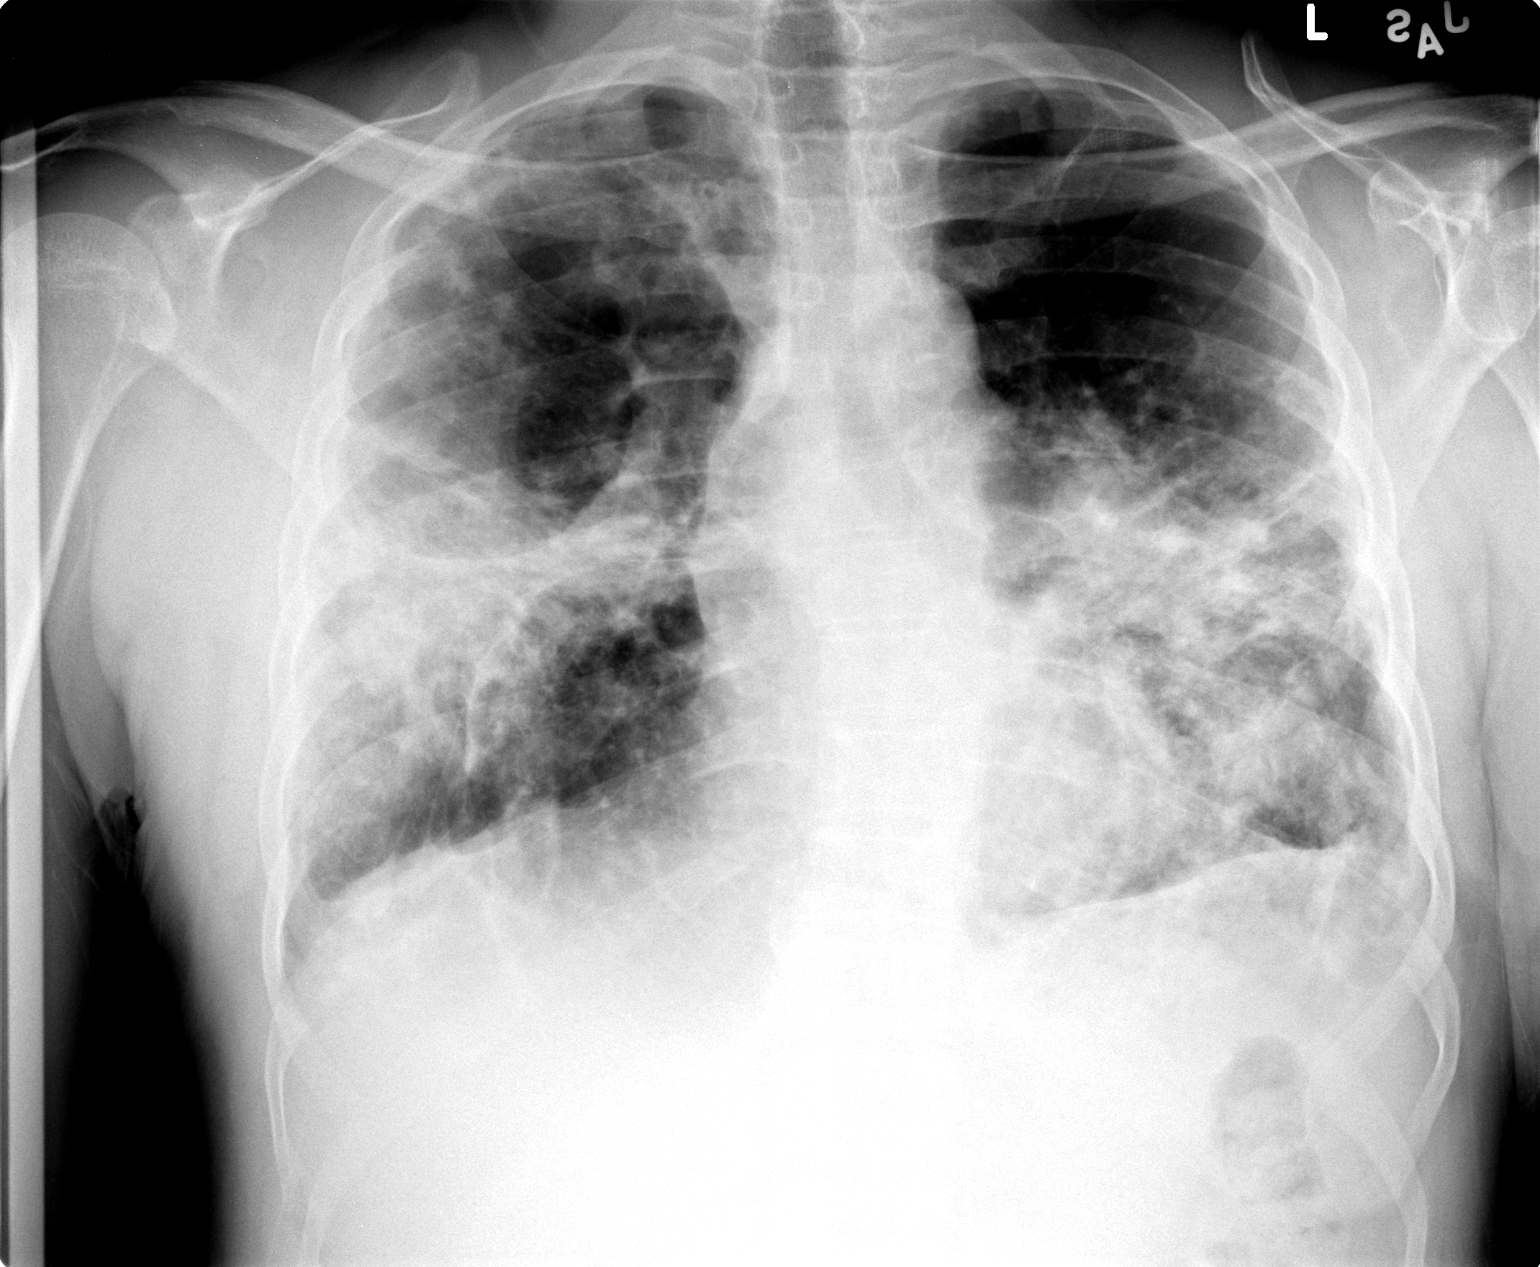

[view not recorded (2 of 2)]
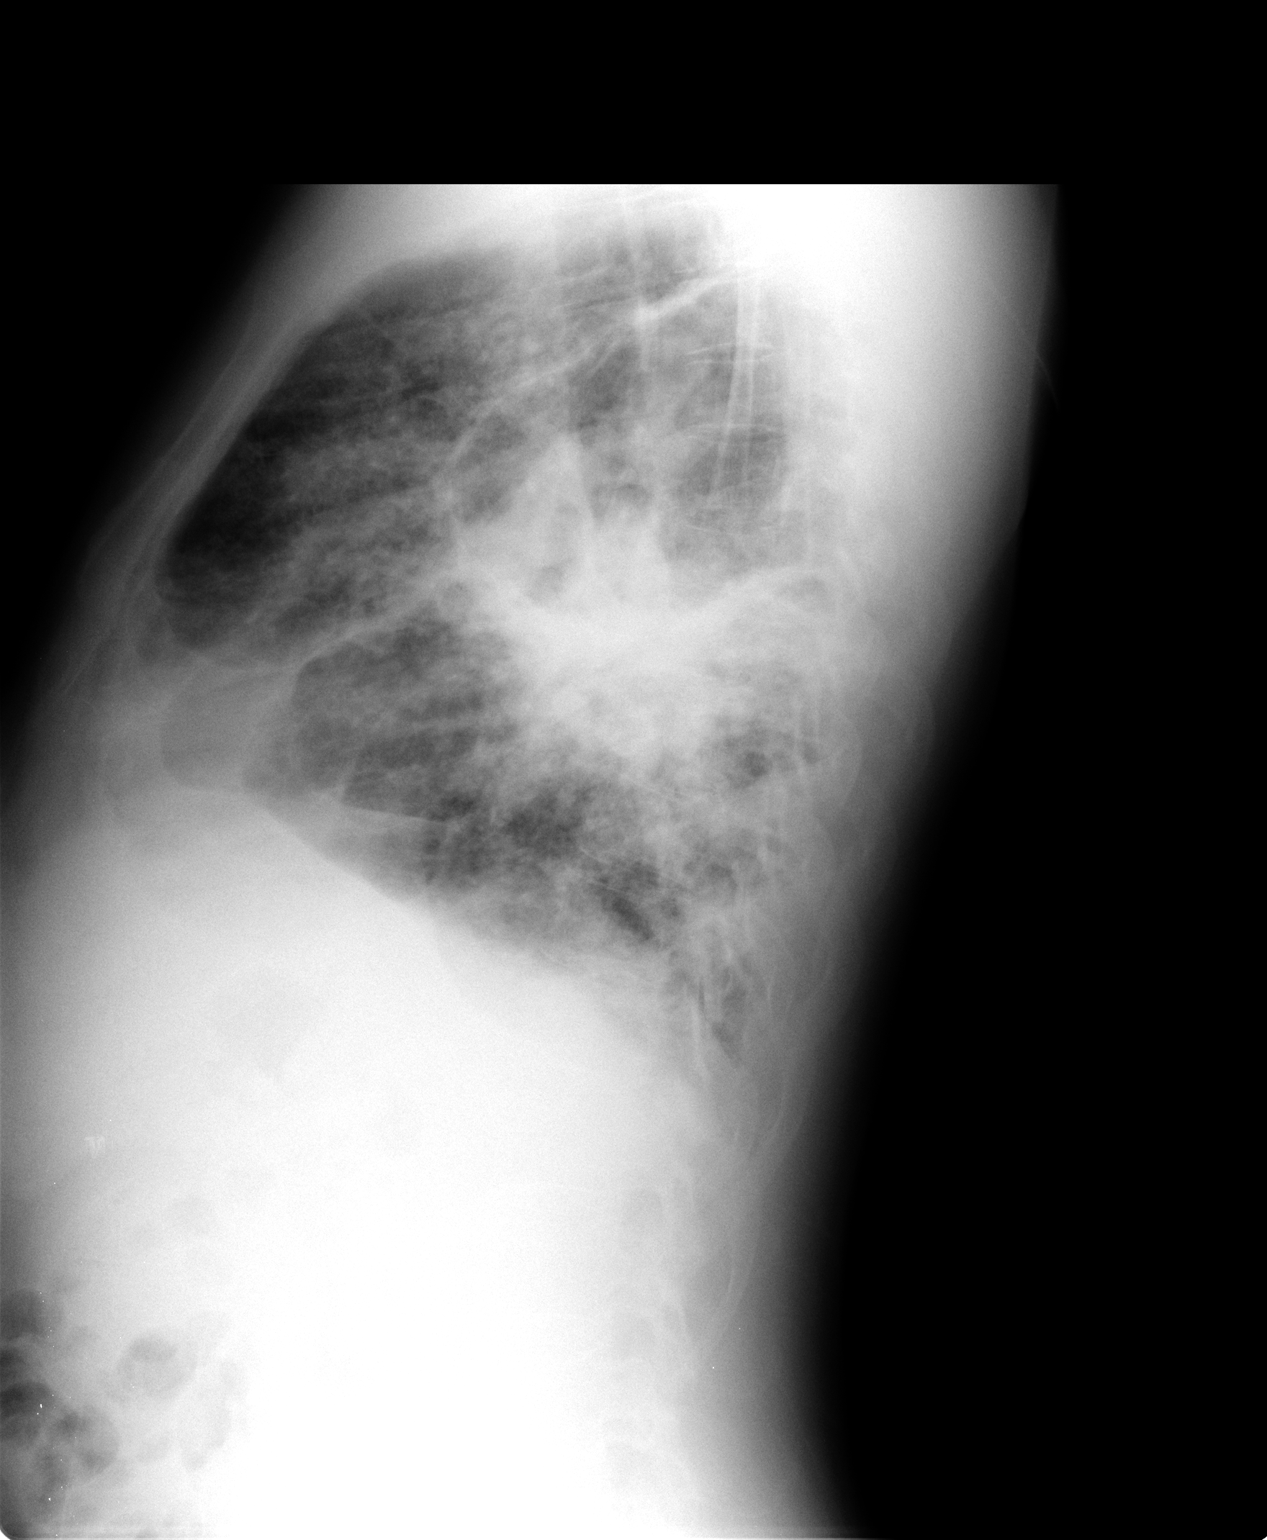

[2 of 2 positions shown; findings below may reference images not displayed]

FINDINGS: Cardiac shadow is stable. Diffuse fibrotic changes are noted within
both lungs and stable from the prior exam. No superimposed
inflammatory changes are noted. No sizable effusion is seen. No
acute bony abnormality is noted.
IMPRESSION: Chronic changes consistent with the given clinical history. No acute
abnormality is noted.

## 2014-08-20 ENCOUNTER — Ambulatory Visit: Payer: 59 | Admitting: Emergency Medicine

## 2014-09-01 ENCOUNTER — Encounter (HOSPITAL_COMMUNITY): Payer: Self-pay | Admitting: *Deleted

## 2014-09-01 ENCOUNTER — Inpatient Hospital Stay (HOSPITAL_COMMUNITY)
Admission: EM | Admit: 2014-09-01 | Discharge: 2014-09-05 | DRG: 193 | Disposition: A | Discharge status: Home / Self Care | Payer: Commercial Managed Care - HMO | Attending: Internal Medicine | Admitting: Internal Medicine

## 2014-09-01 ENCOUNTER — Emergency Department (HOSPITAL_COMMUNITY): Payer: Commercial Managed Care - HMO

## 2014-09-01 DIAGNOSIS — J9621 Acute and chronic respiratory failure with hypoxia: Secondary | ICD-10-CM | POA: Diagnosis present

## 2014-09-01 DIAGNOSIS — I10 Essential (primary) hypertension: Secondary | ICD-10-CM | POA: Diagnosis present

## 2014-09-01 DIAGNOSIS — I5032 Chronic diastolic (congestive) heart failure: Secondary | ICD-10-CM | POA: Diagnosis present

## 2014-09-01 DIAGNOSIS — G4733 Obstructive sleep apnea (adult) (pediatric): Secondary | ICD-10-CM | POA: Diagnosis present

## 2014-09-01 DIAGNOSIS — D869 Sarcoidosis, unspecified: Secondary | ICD-10-CM | POA: Diagnosis present

## 2014-09-01 DIAGNOSIS — K921 Melena: Secondary | ICD-10-CM | POA: Diagnosis not present

## 2014-09-01 DIAGNOSIS — Z79899 Other long term (current) drug therapy: Secondary | ICD-10-CM

## 2014-09-01 DIAGNOSIS — J471 Bronchiectasis with (acute) exacerbation: Secondary | ICD-10-CM | POA: Insufficient documentation

## 2014-09-01 DIAGNOSIS — Z881 Allergy status to other antibiotic agents status: Secondary | ICD-10-CM

## 2014-09-01 DIAGNOSIS — R0602 Shortness of breath: Secondary | ICD-10-CM | POA: Diagnosis present

## 2014-09-01 DIAGNOSIS — Z9981 Dependence on supplemental oxygen: Secondary | ICD-10-CM

## 2014-09-01 DIAGNOSIS — Z9989 Dependence on other enabling machines and devices: Secondary | ICD-10-CM

## 2014-09-01 DIAGNOSIS — J189 Pneumonia, unspecified organism: Principal | ICD-10-CM | POA: Diagnosis present

## 2014-09-01 DIAGNOSIS — Z833 Family history of diabetes mellitus: Secondary | ICD-10-CM | POA: Diagnosis not present

## 2014-09-01 LAB — CBC WITH DIFFERENTIAL/PLATELET
Basophils Absolute: 0 10*3/uL (ref 0.0–0.1)
Basophils Relative: 0 % (ref 0–1)
EOS PCT: 17 % — AB (ref 0–5)
Eosinophils Absolute: 1.1 10*3/uL — ABNORMAL HIGH (ref 0.0–0.7)
HCT: 38.4 % — ABNORMAL LOW (ref 39.0–52.0)
Hemoglobin: 12.5 g/dL — ABNORMAL LOW (ref 13.0–17.0)
LYMPHS PCT: 18 % (ref 12–46)
Lymphs Abs: 1.1 10*3/uL (ref 0.7–4.0)
MCH: 27.7 pg (ref 26.0–34.0)
MCHC: 32.6 g/dL (ref 30.0–36.0)
MCV: 85.1 fL (ref 78.0–100.0)
MONO ABS: 0.6 10*3/uL (ref 0.1–1.0)
Monocytes Relative: 10 % (ref 3–12)
Neutro Abs: 3.4 10*3/uL (ref 1.7–7.7)
Neutrophils Relative %: 55 % (ref 43–77)
PLATELETS: 332 10*3/uL (ref 150–400)
RBC: 4.51 MIL/uL (ref 4.22–5.81)
RDW: 13 % (ref 11.5–15.5)
WBC: 6.2 10*3/uL (ref 4.0–10.5)

## 2014-09-01 LAB — COMPREHENSIVE METABOLIC PANEL
ALBUMIN: 4 g/dL (ref 3.5–5.0)
ALT: 25 U/L (ref 17–63)
AST: 38 U/L (ref 15–41)
Alkaline Phosphatase: 108 U/L (ref 38–126)
Anion gap: 10 (ref 5–15)
BUN: 12 mg/dL (ref 6–20)
CHLORIDE: 104 mmol/L (ref 101–111)
CO2: 26 mmol/L (ref 22–32)
Calcium: 9.8 mg/dL (ref 8.9–10.3)
Creatinine, Ser: 0.88 mg/dL (ref 0.61–1.24)
GFR calc Af Amer: >60 mL/min (ref >60)
GFR calc non Af Amer: >60 mL/min (ref >60)
GLUCOSE: 112 mg/dL — AB (ref 65–99)
POTASSIUM: 4.3 mmol/L (ref 3.5–5.1)
SODIUM: 140 mmol/L (ref 135–145)
Total Bilirubin: 0.4 mg/dL (ref 0.3–1.2)
Total Protein: 7.3 g/dL (ref 6.5–8.1)

## 2014-09-01 LAB — I-STAT TROPONIN, ED: Troponin i, poc: 0 ng/mL (ref 0.00–0.08)

## 2014-09-01 MED ORDER — BISOPROLOL FUMARATE 10 MG PO TABS
10.0000 mg | ORAL_TABLET | Freq: Every day | ORAL | Status: DC
  Administered 2014-09-01 – 2014-09-05 (×5): 10 mg via ORAL
  Filled 2014-09-01 (×5): qty 1

## 2014-09-01 MED ORDER — BISACODYL 10 MG RE SUPP: 10.0000 mg | Freq: Every day | RECTAL | Status: DC | PRN

## 2014-09-01 MED ORDER — SODIUM CHLORIDE 0.9 % IJ SOLN
3.0000 mL | Freq: Two times a day (BID) | INTRAMUSCULAR | Status: DC
  Administered 2014-09-02 – 2014-09-04 (×5): 3 mL via INTRAVENOUS

## 2014-09-01 MED ORDER — CYCLOBENZAPRINE HCL 5 MG PO TABS
5.0000 mg | ORAL_TABLET | Freq: Three times a day (TID) | ORAL | Status: DC | PRN
  Administered 2014-09-02 – 2014-09-04 (×4): 5 mg via ORAL
  Filled 2014-09-01 (×4): qty 1

## 2014-09-01 MED ORDER — IPRATROPIUM BROMIDE 0.02 % IN SOLN
0.5000 mg | Freq: Once | RESPIRATORY_TRACT | Status: AC
  Administered 2014-09-01: 0.5 mg via RESPIRATORY_TRACT
  Filled 2014-09-01: qty 2.5

## 2014-09-01 MED ORDER — GUAIFENESIN ER 600 MG PO TB12
600.0000 mg | ORAL_TABLET | Freq: Two times a day (BID) | ORAL | Status: DC | PRN
  Administered 2014-09-02 – 2014-09-04 (×4): 600 mg via ORAL
  Filled 2014-09-01 (×5): qty 1

## 2014-09-01 MED ORDER — LEVOFLOXACIN IN D5W 750 MG/150ML IV SOLN
750.0000 mg | INTRAVENOUS | Status: DC
  Administered 2014-09-02 – 2014-09-03 (×2): 750 mg via INTRAVENOUS
  Filled 2014-09-01 (×2): qty 150

## 2014-09-01 MED ORDER — DILTIAZEM HCL ER COATED BEADS 120 MG PO CP24
120.0000 mg | ORAL_CAPSULE | Freq: Every day | ORAL | Status: DC
  Administered 2014-09-02 – 2014-09-05 (×4): 120 mg via ORAL
  Filled 2014-09-01 (×4): qty 1

## 2014-09-01 MED ORDER — FAMOTIDINE 20 MG PO TABS
20.0000 mg | ORAL_TABLET | Freq: Two times a day (BID) | ORAL | Status: DC
  Administered 2014-09-01 – 2014-09-05 (×8): 20 mg via ORAL
  Filled 2014-09-01 (×9): qty 1

## 2014-09-01 MED ORDER — LEVOFLOXACIN IN D5W 750 MG/150ML IV SOLN
750.0000 mg | INTRAVENOUS | Status: AC
  Administered 2014-09-01: 750 mg via INTRAVENOUS
  Filled 2014-09-01: qty 150

## 2014-09-01 MED ORDER — METHYLPREDNISOLONE SODIUM SUCC 125 MG IJ SOLR
125.0000 mg | Freq: Once | INTRAMUSCULAR | Status: AC
  Administered 2014-09-01: 125 mg via INTRAVENOUS
  Filled 2014-09-01: qty 2

## 2014-09-01 MED ORDER — HYDROCODONE-ACETAMINOPHEN 5-325 MG PO TABS
1.0000 | ORAL_TABLET | ORAL | Status: DC | PRN
  Administered 2014-09-02 – 2014-09-04 (×7): 2 via ORAL
  Filled 2014-09-01 (×7): qty 2

## 2014-09-01 MED ORDER — IPRATROPIUM-ALBUTEROL 0.5-2.5 (3) MG/3ML IN SOLN
3.0000 mL | RESPIRATORY_TRACT | Status: DC
  Administered 2014-09-01 – 2014-09-02 (×5): 3 mL via RESPIRATORY_TRACT
  Filled 2014-09-01 (×5): qty 3

## 2014-09-01 MED ORDER — METHYLPREDNISOLONE SODIUM SUCC 125 MG IJ SOLR
60.0000 mg | Freq: Three times a day (TID) | INTRAMUSCULAR | Status: DC
  Administered 2014-09-01 – 2014-09-02 (×3): 60 mg via INTRAVENOUS
  Filled 2014-09-01 (×3): qty 2

## 2014-09-01 MED ORDER — ONDANSETRON HCL 4 MG PO TABS: 4.0000 mg | ORAL_TABLET | Freq: Four times a day (QID) | ORAL | Status: DC | PRN

## 2014-09-01 MED ORDER — DIPHENHYDRAMINE HCL 25 MG PO CAPS
25.0000 mg | ORAL_CAPSULE | Freq: Four times a day (QID) | ORAL | Status: DC | PRN
  Administered 2014-09-02 – 2014-09-05 (×5): 25 mg via ORAL
  Filled 2014-09-01 (×5): qty 1

## 2014-09-01 MED ORDER — HEPARIN SODIUM (PORCINE) 5000 UNIT/ML IJ SOLN
5000.0000 [IU] | Freq: Three times a day (TID) | INTRAMUSCULAR | Status: DC
  Administered 2014-09-01 – 2014-09-05 (×11): 5000 [IU] via SUBCUTANEOUS
  Filled 2014-09-01 (×12): qty 1

## 2014-09-01 MED ORDER — ALBUTEROL SULFATE (2.5 MG/3ML) 0.083% IN NEBU
5.0000 mg | INHALATION_SOLUTION | Freq: Once | RESPIRATORY_TRACT | Status: AC
  Administered 2014-09-01: 5 mg via RESPIRATORY_TRACT
  Filled 2014-09-01: qty 6

## 2014-09-01 MED ORDER — IPRATROPIUM-ALBUTEROL 0.5-2.5 (3) MG/3ML IN SOLN
3.0000 mL | RESPIRATORY_TRACT | Status: DC | PRN
  Administered 2014-09-02: 3 mL via RESPIRATORY_TRACT
  Filled 2014-09-01: qty 3

## 2014-09-01 MED ORDER — ONDANSETRON HCL 4 MG/2ML IJ SOLN: 4.0000 mg | Freq: Four times a day (QID) | INTRAMUSCULAR | Status: DC | PRN

## 2014-09-01 NOTE — ED Notes (Signed)
He is less short of breath and is currently receiving a h.h.n. Treatment.

## 2014-09-01 NOTE — ED Provider Notes (Signed)
CSN: [id number]     Arrival date & time 09/01/14  1622 History   First MD Initiated Contact with Patient 09/01/14 1636     Chief Complaint  Patient presents with  . Shortness of Breath     (Consider location/radiation/quality/duration/timing/severity/associated sxs/prior Treatment) Patient is a 49 y.o. male presenting with shortness of breath.  Shortness of Breath Associated symptoms: chest pain and cough   Associated symptoms: no abdominal pain and no headaches    patient presents with shortness of breath. Has been getting worse over the last week and half. Has history of sarcoidosis and bronchiectasis. States he started to get worse around a week and half ago. He has had mild sputum production but is really not change. He is on chronic oxygen 4 L. States he has been unable to walk up stairs without shortness of breath. He states on good days he can walk up the stairs on bad days he cannot. No swelling in his legs. No fevers. He sees Dr. Delton Coombes from pulmonary to manage his sarcoid. He has been admitted 4 times the last 8 months for similar episodes.  Past Medical History  Diagnosis Date  . Hypertension   . Sarcoidosis   . OSA on CPAP   . Chronic respiratory failure     home O@ 4L (01/28/14)  . Bronchitis   . Pneumonia   . COPD (chronic obstructive pulmonary disease)   . Neuromuscular disorder    Past Surgical History  Procedure Laterality Date  . Rotator cuff repair    . Arm surgery     . Video assisted thoracoscopy (vats)/thorocotomy Left 07/17/2012    Procedure: VIDEO ASSISTED THORACOSCOPY (VATS)/BLEB Stapling;  Surgeon: Alleen Borne, MD;  Location: [facility] OR;  Service: Thoracic;  Laterality: Left;   Family History  Problem Relation Age of Onset  . Diabetes Father    Social History  Substance Use Topics  . Smoking status: Never Smoker   . Smokeless tobacco: Never Used  . Alcohol Use: Yes     Comment: social    Review of Systems  Constitutional: Positive for fatigue.  Negative for appetite change.  Respiratory: Positive for cough and shortness of breath.   Cardiovascular: Positive for chest pain.  Gastrointestinal: Negative for abdominal pain.  Genitourinary: Negative for flank pain.  Musculoskeletal: Negative for back pain.  Skin: Negative for wound.  Neurological: Negative for headaches.      Allergies  Clindamycin/lincomycin  Home Medications   Prior to Admission medications   Medication Sig Start Date End Date Taking? Authorizing Provider  albuterol (PROAIR HFA) 108 (90 BASE) MCG/ACT inhaler Inhale 2 puffs into the lungs every 6 (six) hours as needed for wheezing or shortness of breath. 03/28/13  Yes Leslye Peer, MD  albuterol (PROVENTIL) (2.5 MG/3ML) 0.083% nebulizer solution Take 2.5 mg by nebulization every 4 (four) hours as needed for wheezing or shortness of breath (wheezing and shortness of breath).  06/08/12  Yes Historical Provider, MD  bisacodyl (BISACODYL) 5 MG EC tablet Take 1 tablet (5 mg total) by mouth daily as needed for moderate constipation. 01/16/14  Yes Simonne Martinet, NP  bisoprolol (ZEBETA) 10 MG tablet Take 1 tablet (10 mg total) by mouth daily. 03/26/14  Yes Maryann Mikhail, DO  budesonide-formoterol (SYMBICORT) 160-4.5 MCG/ACT inhaler Inhale 2 puffs into the lungs 2 (two) times daily. 11/28/13  Yes Jeanella Craze, NP  calcium-vitamin D (OSCAL WITH D) 500-200 MG-UNIT per tablet Take 1 tablet by mouth every morning.  Yes Historical Provider, MD  cholecalciferol (VITAMIN D) 1000 UNITS tablet Take 1,000 Units by mouth every morning.    Yes Historical Provider, MD  cyclobenzaprine (FLEXERIL) 10 MG tablet Take 10 mg by mouth 3 (three) times daily as needed for muscle spasms (muscle spasms).    Yes Historical Provider, MD  diltiazem (CARDIZEM CD) 120 MG 24 hr capsule Take 1 capsule (120 mg total) by mouth daily. 01/28/14  Yes Jeanella Craze, NP  diphenhydrAMINE (BENADRYL) 25 MG tablet Take 25 mg by mouth every 6 (six) hours as  needed for allergies (allergies).    Yes Historical Provider, MD  famotidine (PEPCID) 20 MG tablet Take 1 tablet (20 mg total) by mouth 2 (two) times daily. 03/08/12  Yes Renae Fickle, MD  ferrous sulfate 325 (65 FE) MG tablet Take 1 tablet (325 mg total) by mouth 2 (two) times daily with a meal. 06/18/12  Yes Leroy Sea, MD  fluticasone (FLONASE) 50 MCG/ACT nasal spray Place 2 sprays into both nostrils 2 (two) times daily. Patient taking differently: Place 1 spray into both nostrils daily as needed for allergies.  01/28/14  Yes Jeanella Craze, NP  guaiFENesin (MUCINEX) 600 MG 12 hr tablet Take 1 tablet (600 mg total) by mouth 2 (two) times daily as needed (For congestion.). 06/19/14  Yes Leslye Peer, MD  Multiple Vitamins-Minerals (MULTIVITAMIN WITH MINERALS) tablet Take 1 tablet by mouth every morning.    Yes Historical Provider, MD  polyethylene glycol (MIRALAX / GLYCOLAX) packet Take 17 g by mouth daily. Patient taking differently: Take 17 g by mouth daily as needed for mild constipation or moderate constipation.  01/16/14  Yes Simonne Martinet, NP  promethazine (PHENERGAN) 25 MG tablet Take 25 mg by mouth every 6 (six) hours as needed for nausea (nausea).  11/02/11  Yes Historical Provider, MD   BP 136/93 mmHg  Pulse 97  Temp(Src) 98.3 F (36.8 C) (Oral)  Resp 20  Ht 5' 7.5" (1.715 m)  Wt 182 lb 12.8 oz (82.918 kg)  BMI 28.19 kg/m2  SpO2 98% Physical Exam  Constitutional: He is oriented to person, place, and time. He appears well-developed and well-nourished.  HENT:  Head: Normocephalic and atraumatic.  Eyes: Pupils are equal, round, and reactive to light.  Neck: Normal range of motion.  Cardiovascular:  No murmur heard. Mild tachycardia  Pulmonary/Chest: He is in respiratory distress.  Tachypnea and pursed lips. Some mild retractions. Overall quiet chest.  Abdominal: Soft. He exhibits no distension. There is no tenderness.  Musculoskeletal: Normal range of motion. He exhibits  no edema.  Neurological: He is alert and oriented to person, place, and time.  Skin: Skin is warm and dry.  Psychiatric: He has a normal mood and affect.  Nursing note and vitals reviewed.   ED Course  Procedures (including critical care time) Labs Review Labs Reviewed  COMPREHENSIVE METABOLIC PANEL - Abnormal; Notable for the following:    Glucose, Bld 112 (*)    All other components within normal limits  CBC WITH DIFFERENTIAL/PLATELET - Abnormal; Notable for the following:    Hemoglobin 12.5 (*)    HCT 38.4 (*)    Eosinophils Relative 17 (*)    Eosinophils Absolute 1.1 (*)    All other components within normal limits  COMPREHENSIVE METABOLIC PANEL  CBC  I-STAT TROPOININ, ED    Imaging Review Dg Chest Portable 1 View  09/01/2014   CLINICAL DATA:  Cough and congestion for 1 week  EXAM: PORTABLE CHEST -  1 VIEW  COMPARISON:  05/31/2014  FINDINGS: Cardiac shadow is stable. Chronic changes are again noted in both lungs. Some increased density in the left mid lung is noted associated with the areas of scarring which may represent acute on chronic infiltrate. No sizable effusion is seen.  IMPRESSION: Chronic changes in both lungs. Slight increased density is noted in the left mid lung which may be related to acute on chronic infiltrate   Electronically Signed   By: Alcide Clever M.D.   On: 09/01/2014 17:22   I have personally reviewed and evaluated these images and lab results as part of my medical decision-making.   EKG Interpretation   Date/Time:  Sunday September 01 2014 16:46:38 EDT Ventricular Rate:  106 PR Interval:  187 QRS Duration: 81 QT Interval:  337 QTC Calculation: 447 R Axis:   89 Text Interpretation:  Sinus tachycardia Biatrial enlargement Baseline  wander in lead(s) II III aVF Confirmed by Rubin Payor  MD, Harrold Donath [id number]) on  09/01/2014 4:51:54 PM      MDM   Final diagnoses:  Sarcoidosis  SOB (shortness of breath)    Patient shortness of breath. History of  sarcoidosis and bronchiectasis. Some mild increase in sputum. His last admission was over 90 days ago. Will admit to internal medicine after discussion with pulmonology.    Benjiman Core, MD 09/02/14 [phone]

## 2014-09-01 NOTE — Progress Notes (Signed)
Pt placed on CPAP QHS.  Pt is comfortable taking himself on and off.  Pt using ffm with 3 Lpm of O2 being bled in.  Machine plugged into red outlet and humidifier filled with sterile water.  Machine set on CPAP of 10 cm H2O per Pt's home settings.  Pt is resting comfortably and stable.

## 2014-09-01 NOTE — Progress Notes (Signed)
ANTIBIOTIC CONSULT NOTE - INITIAL  Pharmacy Consult for Levaquin  Indication: pneumonia  Allergies  Allergen Reactions  . Clindamycin/Lincomycin Anaphylaxis and Swelling    Made face and tongue swell    Patient Measurements:   Weight 82.1 kg (06/17/14)  Vital Signs: Temp: 98.3 F (36.8 C) (08/21 1635) Temp Source: Oral (08/21 1635) BP: 149/92 mmHg (08/21 1635) Pulse Rate: 112 (08/21 1635) Intake/Output from previous day:    Labs:  Recent Labs  09/01/14 1707  WBC 6.2  HGB 12.5*  PLT 332  CREATININE 0.88   CrCl cannot be calculated (Unknown ideal weight.). No results for input(s): VANCOTROUGH, VANCOPEAK, VANCORANDOM, GENTTROUGH, GENTPEAK, GENTRANDOM, TOBRATROUGH, TOBRAPEAK, TOBRARND, AMIKACINPEAK, AMIKACINTROU, AMIKACIN in the last 72 hours.   Microbiology: No results found for this or any previous visit (from the past 720 hour(s)).  Medical History: Past Medical History  Diagnosis Date  . Hypertension   . Sarcoidosis   . OSA on CPAP   . Chronic respiratory failure     home O@ 4L (01/28/14)  . Bronchitis   . Pneumonia   . COPD (chronic obstructive pulmonary disease)   . Neuromuscular disorder     Medications:  Anti-infectives    None     Assessment: 27 yoM presented to ED on 8/21 with SOB.  PMH includes sarcoidosis, home O2, chronic respiratory failure, HTN, COPD, and OSA.  CXR (8/21) with density of left middle lobe which may represent acute on chronic infiltrate.  Pharmacy is consulted to dose Levaquin for suspected pneumonia.  8/21 >> Levaquin >>  Today, 09/01/2014:  Tm 98.3  WBC 6.2  SCr 0.88, CrCl > 100  ml/min   Goal of Therapy:  Appropriate abx dosing, eradication of infection.   Plan:   Levaquin  IV q24h  Follow up renal fxn, culture results, and clinical course.   Lynann Beaver PharmD, BCPS Pager [phone] 09/01/2014 5:22 PM

## 2014-09-01 NOTE — H&P (Signed)
[facility] History and Physical  [name] ZOX:[id number] DOB: 10/14/1965 DOA: 09/01/2014  Referring physician: Emergency Department PCP: Clydie Braun, MD  Specialists:   Chief Complaint: SOB  HPI: [name] is a 49 y.o. male  With a hx of sarcoidosis, HTN, chronic resp failure on home 4LNC well known to Pulmonary service who presents with increased sob and decreased exercise tolerance. In the ED, pt was found to have presenting O2 sat of 86% on RA, improved to 100% on 3LNC. CXR was notable for chronic changes with slight increased density in L mid- lung field. Patient was started on empiric Pulmonary was consulted. Hospitalist consulted for admission.  Review of Systems:  Review of Systems  Constitutional: Negative for fever and chills.  HENT: Negative for ear pain and tinnitus.   Eyes: Negative for pain and discharge.  Respiratory: Positive for cough and shortness of breath.   Cardiovascular: Negative for chest pain and palpitations.  Gastrointestinal: Negative for vomiting and abdominal pain.  Genitourinary: Negative for frequency and flank pain.  Musculoskeletal: Negative for back pain and joint pain.  Neurological: Negative for tingling, tremors, seizures and loss of consciousness.     Past Medical History  Diagnosis Date  . Hypertension   . Sarcoidosis   . OSA on CPAP   . Chronic respiratory failure     home O@ 4L (01/28/14)  . Bronchitis   . Pneumonia   . COPD (chronic obstructive pulmonary disease)   . Neuromuscular disorder    Past Surgical History  Procedure Laterality Date  . Rotator cuff repair    . Arm surgery     . Video assisted thoracoscopy (vats)/thorocotomy Left 07/17/2012    Procedure: VIDEO ASSISTED THORACOSCOPY (VATS)/BLEB Stapling;  Surgeon: Alleen Borne, MD;  Location: [facility] OR;  Service: Thoracic;  Laterality: Left;   Social History:  reports that he has never smoked. He has never used smokeless tobacco. He reports that he  drinks alcohol. He reports that he does not use illicit drugs.  where does patient live--home, ALF, SNF? and with whom if at home?  Can patient participate in ADLs?  Allergies  Allergen Reactions  . Clindamycin/Lincomycin Anaphylaxis and Swelling    Made face and tongue swell    Family History  Problem Relation Age of Onset  . Diabetes Father     (be sure to complete)  Prior to Admission medications   Medication Sig Start Date End Date Taking? Authorizing Provider  albuterol (PROAIR HFA) 108 (90 BASE) MCG/ACT inhaler Inhale 2 puffs into the lungs every 6 (six) hours as needed for wheezing or shortness of breath. 03/28/13  Yes Leslye Peer, MD  albuterol (PROVENTIL) (2.5 MG/3ML) 0.083% nebulizer solution Take 2.5 mg by nebulization every 4 (four) hours as needed for wheezing or shortness of breath (wheezing and shortness of breath).  06/08/12  Yes Historical Provider, MD  bisacodyl (BISACODYL) 5 MG EC tablet Take 1 tablet (5 mg total) by mouth daily as needed for moderate constipation. 01/16/14  Yes Simonne Martinet, NP  bisoprolol (ZEBETA) 10 MG tablet Take 1 tablet (10 mg total) by mouth daily. 03/26/14  Yes Maryann Mikhail, DO  budesonide-formoterol (SYMBICORT) 160-4.5 MCG/ACT inhaler Inhale 2 puffs into the lungs 2 (two) times daily. 11/28/13  Yes Jeanella Craze, NP  calcium-vitamin D (OSCAL WITH D) 500-200 MG-UNIT per tablet Take 1 tablet by mouth every morning.   Yes Historical Provider, MD  cholecalciferol (VITAMIN D) 1000 UNITS tablet Take 1,000 Units by mouth every  morning.    Yes Historical Provider, MD  cyclobenzaprine (FLEXERIL) 10 MG tablet Take 10 mg by mouth 3 (three) times daily as needed for muscle spasms (muscle spasms).    Yes Historical Provider, MD  diltiazem (CARDIZEM CD) 120 MG 24 hr capsule Take 1 capsule (120 mg total) by mouth daily. 01/28/14  Yes Jeanella Craze, NP  diphenhydrAMINE (BENADRYL) 25 MG tablet Take 25 mg by mouth every 6 (six) hours as needed for allergies  (allergies).    Yes Historical Provider, MD  famotidine (PEPCID) 20 MG tablet Take 1 tablet (20 mg total) by mouth 2 (two) times daily. 03/08/12  Yes Renae Fickle, MD  ferrous sulfate 325 (65 FE) MG tablet Take 1 tablet (325 mg total) by mouth 2 (two) times daily with a meal. 06/18/12  Yes Leroy Sea, MD  fluticasone (FLONASE) 50 MCG/ACT nasal spray Place 2 sprays into both nostrils 2 (two) times daily. Patient taking differently: Place 1 spray into both nostrils daily as needed for allergies.  01/28/14  Yes Jeanella Craze, NP  guaiFENesin (MUCINEX) 600 MG 12 hr tablet Take 1 tablet (600 mg total) by mouth 2 (two) times daily as needed (For congestion.). 06/19/14  Yes Leslye Peer, MD  Multiple Vitamins-Minerals (MULTIVITAMIN WITH MINERALS) tablet Take 1 tablet by mouth every morning.    Yes Historical Provider, MD  polyethylene glycol (MIRALAX / GLYCOLAX) packet Take 17 g by mouth daily. Patient taking differently: Take 17 g by mouth daily as needed for mild constipation or moderate constipation.  01/16/14  Yes Simonne Martinet, NP  promethazine (PHENERGAN) 25 MG tablet Take 25 mg by mouth every 6 (six) hours as needed for nausea (nausea).  11/02/11  Yes Historical Provider, MD   Physical Exam: Filed Vitals:   09/01/14 1635 09/01/14 1636 09/01/14 1742  BP: 149/92  134/98  Pulse: 112  112  Temp: 98.3 F (36.8 C)  98.4 F (36.9 C)  TempSrc: Oral  Oral  Resp: 28  15  SpO2: 86% 98% 100%     General:  Awake, in nad  Eyes: PERRL B  ENT: membranes moist, dentition fair  Neck: trachea midline, neck supple  Cardiovascular: regular, s1, s2  Respiratory: Decreased BS throughout with end-expiratory wheezing  Abdomen: soft, nondistended  Skin: normal skin turgor, no abnormal skin lesions seen  Musculoskeletal: perfused, no clubbing  Psychiatric: mood/affect normal// no auditory/visual hallucination  Neurologic: cn2-12 grossly intact, strength/sensation intact  Labs on Admission:   Basic Metabolic Panel:  Recent Labs Lab 09/01/14 1707  NA 140  K 4.3  CL 104  CO2 26  GLUCOSE 112*  BUN 12  CREATININE 0.88  CALCIUM 9.8   Liver Function Tests:  Recent Labs Lab 09/01/14 1707  AST 38  ALT 25  ALKPHOS 108  BILITOT 0.4  PROT 7.3  ALBUMIN 4.0   No results for input(s): LIPASE, AMYLASE in the last 168 hours. No results for input(s): AMMONIA in the last 168 hours. CBC:  Recent Labs Lab 09/01/14 1707  WBC 6.2  NEUTROABS 3.4  HGB 12.5*  HCT 38.4*  MCV 85.1  PLT 332   Cardiac Enzymes: No results for input(s): CKTOTAL, CKMB, CKMBINDEX, TROPONINI in the last 168 hours.  BNP (last 3 results)  Recent Labs  01/17/14 0805 01/23/14 0510 01/25/14 1100  BNP 492.9* 127.1* 98.1    ProBNP (last 3 results)  Recent Labs  11/23/13 1458  PROBNP 97.5    CBG: No results for input(s): GLUCAP in  the last 168 hours.  Radiological Exams on Admission: Dg Chest Portable 1 View  09/01/2014   CLINICAL DATA:  Cough and congestion for 1 week  EXAM: PORTABLE CHEST - 1 VIEW  COMPARISON:  05/31/2014  FINDINGS: Cardiac shadow is stable. Chronic changes are again noted in both lungs. Some increased density in the left mid lung is noted associated with the areas of scarring which may represent acute on chronic infiltrate. No sizable effusion is seen.  IMPRESSION: Chronic changes in both lungs. Slight increased density is noted in the left mid lung which may be related to acute on chronic infiltrate   Electronically Signed   By: Alcide Clever M.D.   On: 09/01/2014 17:22    Assessment/Plan Active Problems:   Sarcoid   OSA on CPAP   HTN (hypertension)   Acute on chronic respiratory failure with hypoxia   SOB (shortness of breath)   Essential hypertension   Sarcoidosis   CAP (community acquired pneumonia)  1. Acute sob 1. O2 sats stable on 3LNC 2. Suspect secondary to CAP (see below) in the setting of sarcoid 3. [name] has discussed case with Pulmonary given  multiple readmissions in the past 4. Will admit to med-tele 2. Possible CAP 1. CXR with findings suggestive of possible CAP 2. Last admission was over 3 months ago 3. Continue on empiric levaquin 3. Sarcoid 1. Chronic changes on cxr 2. Will continue scheduled duonebs and solumedrol 60mg  IV q8hrs 3. On empiric levaquin per above 4. Would defer additional management to Pulmonary 4. HTN 1. BP stable 2. Cont home meds 3. Cont monitor 5. OSA 1. CPAP dependent 2. Will order nocturnal CPAP 6. DVTprophylaxis 1. Heparin subQ  Code Status: Full Family Communication: Pt in room  Disposition Plan: Admit to med-tele   CHIU, Scheryl Marten [facility] Pager [phone]  If 7PM-7AM, please contact night-coverage [email or web address] Password [facility] 09/01/2014, 6:40 PM

## 2014-09-01 NOTE — ED Notes (Signed)
Pt reports SOB x 1.5 week.  Has hx of Sarcoidosis.  On home O2 3L .

## 2014-09-02 LAB — COMPREHENSIVE METABOLIC PANEL
ALBUMIN: 4 g/dL (ref 3.5–5.0)
ALK PHOS: 100 U/L (ref 38–126)
ALT: 23 U/L (ref 17–63)
AST: 39 U/L (ref 15–41)
Anion gap: 8 (ref 5–15)
BILIRUBIN TOTAL: 0.5 mg/dL (ref 0.3–1.2)
BUN: 12 mg/dL (ref 6–20)
CO2: 28 mmol/L (ref 22–32)
Calcium: 9.5 mg/dL (ref 8.9–10.3)
Chloride: 100 mmol/L — ABNORMAL LOW (ref 101–111)
Creatinine, Ser: 1.02 mg/dL (ref 0.61–1.24)
GFR calc Af Amer: >60 mL/min (ref >60)
GFR calc non Af Amer: >60 mL/min (ref >60)
GLUCOSE: 221 mg/dL — AB (ref 65–99)
POTASSIUM: 4.6 mmol/L (ref 3.5–5.1)
Sodium: 136 mmol/L (ref 135–145)
TOTAL PROTEIN: 7.2 g/dL (ref 6.5–8.1)

## 2014-09-02 LAB — CBC
HEMATOCRIT: 38 % — AB (ref 39.0–52.0)
Hemoglobin: 12.3 g/dL — ABNORMAL LOW (ref 13.0–17.0)
MCH: 27.5 pg (ref 26.0–34.0)
MCHC: 32.4 g/dL (ref 30.0–36.0)
MCV: 84.8 fL (ref 78.0–100.0)
Platelets: 288 10*3/uL (ref 150–400)
RBC: 4.48 MIL/uL (ref 4.22–5.81)
RDW: 12.7 % (ref 11.5–15.5)
WBC: 4.1 10*3/uL (ref 4.0–10.5)

## 2014-09-02 MED ORDER — CHLORHEXIDINE GLUCONATE 0.12 % MT SOLN
15.0000 mL | Freq: Two times a day (BID) | OROMUCOSAL | Status: DC
  Administered 2014-09-02 – 2014-09-05 (×7): 15 mL via OROMUCOSAL
  Filled 2014-09-02 (×7): qty 15

## 2014-09-02 MED ORDER — SALINE SPRAY 0.65 % NA SOLN
1.0000 | NASAL | Status: DC | PRN
  Administered 2014-09-02: 1 via NASAL
  Filled 2014-09-02: qty 44

## 2014-09-02 MED ORDER — METHYLPREDNISOLONE SODIUM SUCC 40 MG IJ SOLR
40.0000 mg | INTRAMUSCULAR | Status: DC
  Administered 2014-09-03 – 2014-09-04 (×2): 40 mg via INTRAVENOUS
  Filled 2014-09-02 (×2): qty 1

## 2014-09-02 MED ORDER — CETYLPYRIDINIUM CHLORIDE 0.05 % MT LIQD
7.0000 mL | Freq: Two times a day (BID) | OROMUCOSAL | Status: DC
  Administered 2014-09-02 – 2014-09-04 (×6): 7 mL via OROMUCOSAL

## 2014-09-02 MED ORDER — IPRATROPIUM-ALBUTEROL 0.5-2.5 (3) MG/3ML IN SOLN
3.0000 mL | Freq: Four times a day (QID) | RESPIRATORY_TRACT | Status: DC
  Administered 2014-09-02 – 2014-09-05 (×11): 3 mL via RESPIRATORY_TRACT
  Filled 2014-09-02 (×11): qty 3

## 2014-09-02 MED ORDER — HYDROCOD POLST-CPM POLST ER 10-8 MG/5ML PO SUER
5.0000 mL | Freq: Two times a day (BID) | ORAL | Status: DC | PRN
  Administered 2014-09-02 – 2014-09-04 (×4): 5 mL via ORAL
  Filled 2014-09-02 (×5): qty 5

## 2014-09-02 NOTE — Progress Notes (Addendum)
[facility] PROGRESS NOTE  [name] ZOX:[id number] DOB: 06/19/1965 DOA: 09/01/2014 PCP: Clydie Braun, MD  Assessment/Plan: 1. Acute sob 1. O2 sats remain stable on 3LNC 2. Suspect secondary to possible CAP (see below) in the setting of sarcoid 3. Pt has reported feeling better overnight 2. Possible CAP 1. CXR with findings suggestive of possible CAP 2. Last admission was over 3 months ago 3. Patient has been continued on empiric levaquin 3. Sarcoid 1. Chronic noted changes on cxr 2. Patient is continued on scheduled duonebs and solumedrol 60mg  IV q8hrs with empiric levaquin per above 3. Would defer additional management to Pulmonary 4. HTN 1. BP stable 2. Cont home meds 3. Cont monitor 5. OSA 1. CPAP dependent 2. On nocturnal CPAP 6. DVTprophylaxis 1. Heparin subQ  Code Status: Full Family Communication: Pt in room, family at bedside (indicate person spoken with, relationship, and if by phone, the number) Disposition Plan: Pending   Consultants:  Pulmonary  Procedures:    Antibiotics:  levaquin 8/21>>> (indicate start date, and stop date if known)  HPI/Subjective: Feels better today. States no longer feeling as sob  Objective: Filed Vitals:   09/02/14 0852 09/02/14 1124 09/02/14 1255 09/02/14 1433  BP:    125/88  Pulse:    100  Temp:    98.2 F (36.8 C)  TempSrc:    Oral  Resp:    18  Height:      Weight:      SpO2: 97% 97% 94% 100%    Intake/Output Summary (Last 24 hours) at 09/02/14 1549 Last data filed at 09/02/14 0903  Gross per 24 hour  Intake    360 ml  Output    600 ml  Net   -240 ml   Filed Weights   09/01/14 2000  Weight: 82.918 kg (182 lb 12.8 oz)    Exam:   General:  Awake, in nad  Cardiovascular: regular, s1, s2  Respiratory: normal resp effort, trace end-expiratory wheezing  Abdomen: soft,nondistended  Musculoskeletal: perfused, no clubbing   Data Reviewed: Basic Metabolic Panel:  Recent  Labs Lab 09/01/14 1707 09/02/14 0504  NA 140 136  K 4.3 4.6  CL 104 100*  CO2 26 28  GLUCOSE 112* 221*  BUN 12 12  CREATININE 0.88 1.02  CALCIUM 9.8 9.5   Liver Function Tests:  Recent Labs Lab 09/01/14 1707 09/02/14 0504  AST 38 39  ALT 25 23  ALKPHOS 108 100  BILITOT 0.4 0.5  PROT 7.3 7.2  ALBUMIN 4.0 4.0   No results for input(s): LIPASE, AMYLASE in the last 168 hours. No results for input(s): AMMONIA in the last 168 hours. CBC:  Recent Labs Lab 09/01/14 1707 09/02/14 0504  WBC 6.2 4.1  NEUTROABS 3.4  --   HGB 12.5* 12.3*  HCT 38.4* 38.0*  MCV 85.1 84.8  PLT 332 288   Cardiac Enzymes: No results for input(s): CKTOTAL, CKMB, CKMBINDEX, TROPONINI in the last 168 hours. BNP (last 3 results)  Recent Labs  01/17/14 0805 01/23/14 0510 01/25/14 1100  BNP 492.9* 127.1* 98.1    ProBNP (last 3 results)  Recent Labs  11/23/13 1458  PROBNP 97.5    CBG: No results for input(s): GLUCAP in the last 168 hours.  No results found for this or any previous visit (from the past 240 hour(s)).   Studies: Dg Chest Portable 1 View  09/01/2014   CLINICAL DATA:  Cough and congestion for 1 week  EXAM: PORTABLE CHEST - 1 VIEW  COMPARISON:  05/31/2014  FINDINGS: Cardiac shadow is stable. Chronic changes are again noted in both lungs. Some increased density in the left mid lung is noted associated with the areas of scarring which may represent acute on chronic infiltrate. No sizable effusion is seen.  IMPRESSION: Chronic changes in both lungs. Slight increased density is noted in the left mid lung which may be related to acute on chronic infiltrate   Electronically Signed   By: Alcide Clever M.D.   On: 09/01/2014 17:22    Scheduled Meds: . antiseptic oral rinse  7 mL Mouth Rinse q12n4p  . bisoprolol  10 mg Oral Daily  . chlorhexidine  15 mL Mouth Rinse BID  . diltiazem  120 mg Oral Daily  . famotidine  20 mg Oral BID  . heparin  5,000 Units Subcutaneous 3 times per day   . ipratropium-albuterol  3 mL Nebulization Q4H  . levofloxacin (LEVAQUIN) IV  750 mg Intravenous Q24H  . methylPREDNISolone (SOLU-MEDROL) injection  60 mg Intravenous 3 times per day  . sodium chloride  3 mL Intravenous Q12H   Continuous Infusions:   Active Problems:   Sarcoid   OSA on CPAP   HTN (hypertension)   Acute on chronic respiratory failure with hypoxia   SOB (shortness of breath)   Essential hypertension   Sarcoidosis   CAP (community acquired pneumonia)    [name]  [facility] Pager [phone]. If 7PM-7AM, please contact night-coverage at [email or web address], password [facility] 09/02/2014, 3:49 PM  LOS: 1 day

## 2014-09-02 NOTE — Consult Note (Signed)
Name: [name] MRN: [id number] DOB: [birth year]    ADMISSION DATE:  09/01/2014 CONSULTATION DATE:  09/02/14  REFERRING MD :  Dr. Rhona Leavens   CHIEF COMPLAINT:  Exertional dyspnea, hypoxemia   BRIEF PATIENT DESCRIPTION: 49 y/o M with PMH of sarcoidois, OSA on CPAP who was admitted 8/21 with exertional dyspnea and hypoxemia in the setting of possible CAP.  PCCM consulted for evaluation.   SIGNIFICANT EVENTS  8/21  Admit with exertional dyspnea  STUDIES:  8/21  CXR >> chronic changes in both lungs, mild increase in L mid lung infiltrate   HISTORY OF PRESENT ILLNESS:  49 y/o M with PMH of HTN, sarcoidois, bronchiectasis (followed by Dr. Delton Coombes), VATS (L, 07/2012), OSA on CPAP and chronic hypoxemia on 3-4L O2 who was admitted 8/21 with a 1.5 week history of worsening shortness of breath.    The patient notes he has been feeling poorly for 1.5 week.  He was in his usual state of health and then woke the next am and felt fatigue, cough with minimal sputum production & worsening shortness of breath.  He felt as if he had a "cold" and was taking mucinex as well as OTC cold / flu medications.  He denies known fevers, chills, nausea / vomiting / diarrhea.  Reports abdominal bloating despite bowel movements (note reports of this in June as well) and dark, tarry stools with occasional bright red blood on tissue paper.  He denies lower extremity swelling.  The patient reports wearing his O2 and CPAP faithfully.  Initial ER evaluation noted saturations of 86% but the patient reports this was assessed on room air as they were changing him over in the ER. He was placed back on baseline O2 and saturations improved to 100%.  CXR showed chronic lower scarring and possible increased density in left mid-lung.  He was empirically treated with antibiotics and admitted per [facility] for further evaluation.   The patient denies known sick contacts.  He was started on duonebs Q6 and solumedrol IV.  8/22 labs notable for WBC 4.1,  Hgb 12.3, Na 136, Cl 100, Sr Cr 1.02, glucose 221 and negative troponin.  Review of last ECHO in 12/2013 shows grade I diastolic dysfunction, normal right heart.  PCCM consulted for evaluation.     PAST MEDICAL HISTORY :   has a past medical history of Hypertension; Sarcoidosis; OSA on CPAP; Chronic respiratory failure; Bronchitis; Pneumonia; COPD (chronic obstructive pulmonary disease); and Neuromuscular disorder.  has past surgical history that includes Rotator cuff repair; arm surgery ; and Video assisted thoracoscopy (vats)/thorocotomy (Left, 07/17/2012).   Prior to Admission medications   Medication Sig Start Date End Date Taking? Authorizing Provider  albuterol (PROAIR HFA) 108 (90 BASE) MCG/ACT inhaler Inhale 2 puffs into the lungs every 6 (six) hours as needed for wheezing or shortness of breath. 03/28/13  Yes Leslye Peer, MD  albuterol (PROVENTIL) (2.5 MG/3ML) 0.083% nebulizer solution Take 2.5 mg by nebulization every 4 (four) hours as needed for wheezing or shortness of breath (wheezing and shortness of breath).  06/08/12  Yes Historical Provider, MD  bisacodyl (BISACODYL) 5 MG EC tablet Take 1 tablet (5 mg total) by mouth daily as needed for moderate constipation. 01/16/14  Yes Simonne Martinet, NP  bisoprolol (ZEBETA) 10 MG tablet Take 1 tablet (10 mg total) by mouth daily. 03/26/14  Yes Maryann Mikhail, DO  budesonide-formoterol (SYMBICORT) 160-4.5 MCG/ACT inhaler Inhale 2 puffs into the lungs 2 (two) times daily. 11/28/13  Yes Carola Viramontes L  Veleta Miners, NP  calcium-vitamin D (OSCAL WITH D) 500-200 MG-UNIT per tablet Take 1 tablet by mouth every morning.   Yes Historical Provider, MD  cholecalciferol (VITAMIN D) 1000 UNITS tablet Take 1,000 Units by mouth every morning.    Yes Historical Provider, MD  cyclobenzaprine (FLEXERIL) 10 MG tablet Take 10 mg by mouth 3 (three) times daily as needed for muscle spasms (muscle spasms).    Yes Historical Provider, MD  diltiazem (CARDIZEM CD) 120 MG 24 hr capsule  Take 1 capsule (120 mg total) by mouth daily. 01/28/14  Yes Jeanella Craze, NP  diphenhydrAMINE (BENADRYL) 25 MG tablet Take 25 mg by mouth every 6 (six) hours as needed for allergies (allergies).    Yes Historical Provider, MD  famotidine (PEPCID) 20 MG tablet Take 1 tablet (20 mg total) by mouth 2 (two) times daily. 03/08/12  Yes Renae Fickle, MD  ferrous sulfate 325 (65 FE) MG tablet Take 1 tablet (325 mg total) by mouth 2 (two) times daily with a meal. 06/18/12  Yes Leroy Sea, MD  fluticasone (FLONASE) 50 MCG/ACT nasal spray Place 2 sprays into both nostrils 2 (two) times daily. Patient taking differently: Place 1 spray into both nostrils daily as needed for allergies.  01/28/14  Yes Jeanella Craze, NP  guaiFENesin (MUCINEX) 600 MG 12 hr tablet Take 1 tablet (600 mg total) by mouth 2 (two) times daily as needed (For congestion.). 06/19/14  Yes Leslye Peer, MD  Multiple Vitamins-Minerals (MULTIVITAMIN WITH MINERALS) tablet Take 1 tablet by mouth every morning.    Yes Historical Provider, MD  polyethylene glycol (MIRALAX / GLYCOLAX) packet Take 17 g by mouth daily. Patient taking differently: Take 17 g by mouth daily as needed for mild constipation or moderate constipation.  01/16/14  Yes Simonne Martinet, NP  promethazine (PHENERGAN) 25 MG tablet Take 25 mg by mouth every 6 (six) hours as needed for nausea (nausea).  11/02/11  Yes Historical Provider, MD   Allergies  Allergen Reactions  . Clindamycin/Lincomycin Anaphylaxis and Swelling    Made face and tongue swell    FAMILY HISTORY:  family history includes Diabetes in his father.   SOCIAL HISTORY:  reports that he has never smoked. He has never used smokeless tobacco. He reports that he drinks alcohol. He reports that he does not use illicit drugs.  REVIEW OF SYSTEMS:   Constitutional: Negative for fever,  weight loss, and diaphoresis. Reports occasional chills, fatigue HENT: Negative for hearing loss, ear pain, nosebleeds,  congestion, sore throat, neck pain, tinnitus and ear discharge.   Eyes: Negative for blurred vision, double vision, photophobia, pain, discharge and redness.  Respiratory: Negative for hemoptysis, sputum production, wheezing and stridor.  Reports cough, shortness of breath, minimal sputum production  Cardiovascular: Negative for chest pain, palpitations, orthopnea, claudication, leg swelling and PND.  Gastrointestinal: Negative for heartburn, nausea, vomiting, abdominal pain, diarrhea, constipation.  Reports occasional blood in stool/on tissue paper and dark/tarry stools.  Genitourinary: Negative for dysuria, urgency, frequency, hematuria and flank pain.  Musculoskeletal: Negative for myalgias, back pain, joint pain and falls.  Skin: Negative for itching and rash.  Neurological: Negative for dizziness, tingling, tremors, sensory change, speech change, focal weakness, seizures, loss of consciousness, weakness and headaches.  Endo/Heme/Allergies: Negative for environmental allergies and polydipsia. Does not bruise/bleed easily.  SUBJECTIVE:   VITAL SIGNS: Temp:  [97.5 F (36.4 C)-98.4 F (36.9 C)] 97.5 F (36.4 C) (08/22 0557) Pulse Rate:  [88-112] 88 (08/22 0557) Resp:  [15-28]  20 (08/22 0557) BP: (127-149)/(88-98) 127/90 mmHg (08/22 0557) SpO2:  [86 %-100 %] 94 % (08/22 1255) FiO2 (%):  [32 %] 32 % (08/22 1255) Weight:  [182 lb 12.8 oz (82.918 kg)] 182 lb 12.8 oz (82.918 kg) (08/21 2000)  PHYSICAL EXAMINATION: General:  Well developed adult male in NAD Neuro:  AAOx4, speech clear, MAE  HEENT:  MM pink/moist, no JVD Cardiovascular:  s1s2 rrr, no m/r/g  Lungs:  resp's even/non-labored, distant breath sounds, faint lower insp crackles posteriorly  Abdomen:  Mild distention, non-tender, bsx4 active  Musculoskeletal:  No acute deformities  Skin:  Warm/dry, no edema   Recent Labs Lab 09/01/14 1707 09/02/14 0504  NA 140 136  K 4.3 4.6  CL 104 100*  CO2 26 28  BUN 12 12    CREATININE 0.88 1.02  GLUCOSE 112* 221*    Recent Labs Lab 09/01/14 1707 09/02/14 0504  HGB 12.5* 12.3*  HCT 38.4* 38.0*  WBC 6.2 4.1  PLT 332 288   Dg Chest Portable 1 View  09/01/2014   CLINICAL DATA:  Cough and congestion for 1 week  EXAM: PORTABLE CHEST - 1 VIEW  COMPARISON:  05/31/2014  FINDINGS: Cardiac shadow is stable. Chronic changes are again noted in both lungs. Some increased density in the left mid lung is noted associated with the areas of scarring which may represent acute on chronic infiltrate. No sizable effusion is seen.  IMPRESSION: Chronic changes in both lungs. Slight increased density is noted in the left mid lung which may be related to acute on chronic infiltrate   Electronically Signed   By: Alcide Clever M.D.   On: 09/01/2014 17:22    ASSESSMENT / PLAN:  Acute on Chronic Hypoxemic Respiratory Failure - initial O2 saturations recorded as 86% on RA  Exertional Dyspnea  Sarcoidosis / Bronchiectasis  Grade I Diastolic Dysfunction - as seen on ECHO 12/2013, no PAH   49 y/o M with PMH of chronic O2 dependent respiratory failure in the setting of sarcoidosis/bronchiectasis admitted with worsening dyspnea and URI symptoms.  He does not appear volume overloaded on exam and prior ECHO only showed grade I diastolic dysfunction.  Radiographic review back to January of 2016 does show an intermittent worsening of of lower infiltrates with clearing.  Current film has a mild increase in LL density and chronic scarring.  He was off prednisone prior to this admission.    Plan: Follow up CXR in am 8/23 Oxygen at 3-4L (baseline) Continue Mucinex, pulmonary hygiene Duoneb to Q6 with Q3 PRN albuterol  Reduce steroids to 40 mg QD Empiric levaquin  Intermittent CXR to follow LLL Assess ambulatory saturations on O2 prior to discharge to ensure no up titration of O2 needed   OSA  Plan: Continue nocturnal CPAP   Bloating  Melena ?  / Hemorrhoids ? - see HPI  Plan: FOB  stool  Defer further management to primary service  Monitor Hgb, appears to be at baseline (~12)  Canary Brim, NP-C [facility] Pgr: [phone] or if no answer [phone] 09/02/2014, 2:30 PM

## 2014-09-02 NOTE — Progress Notes (Signed)
Pt states he does not need any assistance with CPAP or mask application. CPAP is set at 10cm2O ramp-5cmH2O with 4Lpm of oxygen bled in via ffm. RT will continue to monitor as needed.

## 2014-09-03 LAB — CBC
HCT: 37 % — ABNORMAL LOW (ref 39.0–52.0)
Hemoglobin: 11.9 g/dL — ABNORMAL LOW (ref 13.0–17.0)
MCH: 27.4 pg (ref 26.0–34.0)
MCHC: 32.2 g/dL (ref 30.0–36.0)
MCV: 85.3 fL (ref 78.0–100.0)
Platelets: 332 10*3/uL (ref 150–400)
RBC: 4.34 MIL/uL (ref 4.22–5.81)
RDW: 12.8 % (ref 11.5–15.5)
WBC: 16.1 10*3/uL — ABNORMAL HIGH (ref 4.0–10.5)

## 2014-09-03 LAB — BASIC METABOLIC PANEL
Anion gap: 8 (ref 5–15)
BUN: 17 mg/dL (ref 6–20)
CALCIUM: 9.4 mg/dL (ref 8.9–10.3)
CO2: 30 mmol/L (ref 22–32)
CREATININE: 1.14 mg/dL (ref 0.61–1.24)
Chloride: 100 mmol/L — ABNORMAL LOW (ref 101–111)
GFR calc non Af Amer: >60 mL/min (ref >60)
Glucose, Bld: 137 mg/dL — ABNORMAL HIGH (ref 65–99)
Potassium: 4.9 mmol/L (ref 3.5–5.1)
SODIUM: 138 mmol/L (ref 135–145)

## 2014-09-03 LAB — OCCULT BLOOD X 1 CARD TO LAB, STOOL: FECAL OCCULT BLD: NEGATIVE

## 2014-09-03 NOTE — Progress Notes (Addendum)
[facility] PROGRESS NOTE  [name] ZOX:[id number] DOB: 1965/03/19 DOA: 09/01/2014 PCP: Clydie Braun, MD   Off Service Summary: 80XB with sarcoidosis presents with recurrent flare with possible CAP. On steroids, scheduled nebs, abx. Pulmonary following  Assessment/Plan: 1. Acute sob 1. O2 sats remain stable on 3-4LNC 2. Suspect secondary to possible CAP (see below) in the setting of sarcoid 3. Pt has reported feeling  4. Pulmonary following 5. Chest PT ordered 2. Possible CAP 1. CXR with findings suggestive of possible CAP 2. Last admission was over 3 months ago 3. Patient has been continued on empiric levaquin 3. Sarcoid 1. Chronic noted changes on cxr 2. Patient is continued on scheduled duonebs and solumedrol  IV q24hrs with empiric levaquin per above 3. Cont to defer additional management to Pulmonary 4. HTN 1. BP remains stable 2. Cont home meds 3. Cont monitor 5. OSA 1. CPAP dependent 2. On nocturnal CPAP 6. DVTprophylaxis 1. Heparin subQ  Code Status: Full Family Communication: Pt in room Disposition Plan: Pending   Consultants:  Pulmonary  Procedures:    Antibiotics:  levaquin 8/21>>> (indicate start date, and stop date if known)  HPI/Subjective: States having more congestion today. No other complaints  Objective: Filed Vitals:   09/03/14 0603 09/03/14 0855 09/03/14 1321 09/03/14 1430  BP: 130/90  137/94   Pulse: 90  88   Temp: 97.4 F (36.3 C)  97.6 F (36.4 C)   TempSrc: Axillary  Axillary   Resp: 18  22   Height:      Weight:      SpO2: 100% 98% 100% 99%    Intake/Output Summary (Last 24 hours) at 09/03/14 1440 Last data filed at 09/03/14 1300  Gross per 24 hour  Intake   1470 ml  Output   1775 ml  Net   -305 ml   Filed Weights   09/01/14 2000  Weight: 82.918 kg (182 lb 12.8 oz)    Exam:   General:  Awake, laying in bed, in nad  Cardiovascular: regular, s1, s2  Respiratory: normal resp effort,  clear B with minimal end-expiratory wheezing  Abdomen: soft,nondistended  Musculoskeletal: perfused, no clubbing   Data Reviewed: Basic Metabolic Panel:  Recent Labs Lab 09/01/14 1707 09/02/14 0504 09/03/14 0534  NA 140 136 138  K 4.3 4.6 4.9  CL 104 100* 100*  CO2 GLUCOSE 112* 221* 137*  BUN CREATININE 0.88 1.02 1.14  CALCIUM 9.8 9.5 9.4   Liver Function Tests:  Recent Labs Lab 09/01/14 1707 09/02/14 0504  AST 38 39  ALT 25 23  ALKPHOS 108 100  BILITOT 0.4 0.5  PROT 7.3 7.2  ALBUMIN 4.0 4.0   No results for input(s): LIPASE, AMYLASE in the last 168 hours. No results for input(s): AMMONIA in the last 168 hours. CBC:  Recent Labs Lab 09/01/14 1707 09/02/14 0504 09/03/14 0534  WBC 6.2 4.1 16.1*  NEUTROABS 3.4  --   --   HGB 12.5* 12.3* 11.9*  HCT 38.4* 38.0* 37.0*  MCV 85.1 84.8 85.3  PLT 332 288 332   Cardiac Enzymes: No results for input(s): CKTOTAL, CKMB, CKMBINDEX, TROPONINI in the last 168 hours. BNP (last 3 results)  Recent Labs  01/17/14 0805 01/23/14 0510 01/25/14 1100  BNP 492.9* 127.1* 98.1    ProBNP (last 3 results)  Recent Labs  11/23/13 1458  PROBNP 97.5    CBG: No results for input(s): GLUCAP in the last 168 hours.  No  results found for this or any previous visit (from the past 240 hour(s)).   Studies: Dg Chest Portable 1 View  09/01/2014   CLINICAL DATA:  Cough and congestion for 1 week  EXAM: PORTABLE CHEST - 1 VIEW  COMPARISON:  05/31/2014  FINDINGS: Cardiac shadow is stable. Chronic changes are again noted in both lungs. Some increased density in the left mid lung is noted associated with the areas of scarring which may represent acute on chronic infiltrate. No sizable effusion is seen.  IMPRESSION: Chronic changes in both lungs. Slight increased density is noted in the left mid lung which may be related to acute on chronic infiltrate   Electronically Signed   By: Alcide Clever M.D.   On: 09/01/2014  17:22    Scheduled Meds: . antiseptic oral rinse  7 mL Mouth Rinse q12n4p  . bisoprolol  10 mg Oral Daily  . chlorhexidine  15 mL Mouth Rinse BID  . diltiazem  120 mg Oral Daily  . famotidine  20 mg Oral BID  . heparin  5,000 Units Subcutaneous 3 times per day  . ipratropium-albuterol  3 mL Nebulization Q6H  . levofloxacin (LEVAQUIN) IV  750 mg Intravenous Q24H  . methylPREDNISolone (SOLU-MEDROL) injection  40 mg Intravenous Q24H  . sodium chloride  3 mL Intravenous Q12H   Continuous Infusions:   Active Problems:   Sarcoid   OSA on CPAP   HTN (hypertension)   Acute on chronic respiratory failure with hypoxia   SOB (shortness of breath)   Essential hypertension   Sarcoidosis   CAP (community acquired pneumonia)    Icela Glymph K  [facility] Pager [phone]. If 7PM-7AM, please contact night-coverage at [email or web address], password [facility] 09/03/2014, 2:40 PM  LOS: 2 days

## 2014-09-03 NOTE — Progress Notes (Signed)
[facility]  See my note dated today, but attached to Canary Brim' note from 8/22  Heber Independence, MD [place] [facility] Pager: [phone] Cell: [phone] After 3pm or if no response, call [phone]

## 2014-09-03 NOTE — Progress Notes (Signed)
Pt states he does not need any assistance with CPAP or mask application. CPAP is set at 10cm2O ramp-5cmH2O with 3.5Lpm of oxygen bled in via ffm. RT will continue to monitor as needed.

## 2014-09-03 NOTE — Care Management Note (Signed)
Case Management Note  Patient Details  Name: Jamaree Hosier MRN: [id number] Date of Birth: 08-Sep-1965  Subjective/Objective:      49 yo admitted with Sarcoidosis              Action/Plan: From home alone  Expected Discharge Date:                  Expected Discharge Plan:  Home/Self Care  In-House Referral:     Discharge planning Services  CM Consult  Post Acute Care Choice:    Choice offered to:     DME Arranged:    DME Agency:     HH Arranged:    HH Agency:     Status of Service:  In process, will continue to follow  Medicare Important Message Given:    Date Medicare IM Given:    Medicare IM give by:    Date Additional Medicare IM Given:    Additional Medicare Important Message give by:     If discussed at Long Length of Stay Meetings, dates discussed:    Additional Comments: Pt currently uses home 02, nebulizers and cpap. CM will continue to follow for additional DC needs. Bartholome Bill, RN 09/03/2014, 3:03 PM

## 2014-09-04 ENCOUNTER — Inpatient Hospital Stay (HOSPITAL_COMMUNITY): Payer: Commercial Managed Care - HMO

## 2014-09-04 DIAGNOSIS — D869 Sarcoidosis, unspecified: Secondary | ICD-10-CM

## 2014-09-04 DIAGNOSIS — I10 Essential (primary) hypertension: Secondary | ICD-10-CM

## 2014-09-04 DIAGNOSIS — J471 Bronchiectasis with (acute) exacerbation: Secondary | ICD-10-CM

## 2014-09-04 DIAGNOSIS — K921 Melena: Secondary | ICD-10-CM

## 2014-09-04 DIAGNOSIS — J9621 Acute and chronic respiratory failure with hypoxia: Secondary | ICD-10-CM

## 2014-09-04 LAB — CBC
HCT: 37 % — ABNORMAL LOW (ref 39.0–52.0)
Hemoglobin: 11.4 g/dL — ABNORMAL LOW (ref 13.0–17.0)
MCH: 26.3 pg (ref 26.0–34.0)
MCHC: 30.8 g/dL (ref 30.0–36.0)
MCV: 85.5 fL (ref 78.0–100.0)
PLATELETS: 314 10*3/uL (ref 150–400)
RBC: 4.33 MIL/uL (ref 4.22–5.81)
RDW: 13 % (ref 11.5–15.5)
WBC: 13.2 10*3/uL — AB (ref 4.0–10.5)

## 2014-09-04 MED ORDER — LEVOFLOXACIN 750 MG PO TABS
750.0000 mg | ORAL_TABLET | Freq: Every day | ORAL | Status: DC
  Administered 2014-09-04: 750 mg via ORAL
  Filled 2014-09-04: qty 1

## 2014-09-04 MED ORDER — PREDNISONE 5 MG PO TABS
30.0000 mg | ORAL_TABLET | Freq: Every day | ORAL | Status: DC
  Administered 2014-09-05: 30 mg via ORAL
  Filled 2014-09-04: qty 2
  Filled 2014-09-04: qty 1

## 2014-09-04 MED ORDER — BUDESONIDE-FORMOTEROL FUMARATE 160-4.5 MCG/ACT IN AERO
2.0000 | INHALATION_SPRAY | Freq: Two times a day (BID) | RESPIRATORY_TRACT | Status: DC
Start: 2014-09-04 — End: 2014-09-05
  Administered 2014-09-04 – 2014-09-05 (×3): 2 via RESPIRATORY_TRACT
  Filled 2014-09-04: qty 6

## 2014-09-04 NOTE — Progress Notes (Signed)
Pt will self administer CPAP when ready for bed.  Current settings are Auto CPAP 5-10 CMH20 with 4 LPM O2 bleed in.  RT to monitor and assess as needed.

## 2014-09-04 NOTE — Progress Notes (Signed)
PROGRESS NOTE  [name] ZOX:[id number] DOB: [birth year] DOA: 09/01/2014 PCP: Clydie Braun, MD  HPI/Recap of past 92 hours: 49 year old patient with past history of sarcoid on chronic oxygen admitted on 8/21 for acute respiratory failure felt to be secondary to bronchiectasis flare. Pulmonary consulted. Patient started on broncho-dilators, IV steroids and supplemental increased oxygen.  Patient feeling better, near back to baseline. Down to 2-3 liters, (2 L at home), and maintaining oxygen saturation on ambulating. IV steroids changed to  Assessment/Plan: Active Problems:    Chronic diastolic heart failure: Euvolemic    Acute on chronic respiratory failure with hypoxia secondary to sarcoidosis with bronchiectasis flare: Chest x-ray confirms no pneumonia. Change antibiotics to by mouth. Change Solu-Medrol to prednisone.    Essential hypertension: Blood pressure stable    Code Status: Full code  Family Communication: Spoke with girlfriend on the phone  Disposition Plan: Anticipate discharge tomorrow   Consultants:  Pulmonary  Procedures:  None  Antibiotics:  Levaquin: 8/21-present, changed to by mouth on 8/24   Objective: BP 126/83 mmHg  Pulse 81  Temp(Src) 98 F (36.7 C) (Oral)  Resp 20  Ht 5' 7.5" (1.715 m)  Wt 82.918 kg (182 lb 12.8 oz)  BMI 28.19 kg/m2  SpO2 99%  Intake/Output Summary (Last 24 hours) at 09/04/14 1742 Last data filed at 09/04/14 1500  Gross per 24 hour  Intake    840 ml  Output    900 ml  Net    -60 ml   Filed Weights   09/01/14 2000  Weight: 82.918 kg (182 lb 12.8 oz)    Exam:   General:  Alert and oriented 3, no acute distress  Cardiovascular: Regular rate and rhythm, S1-S2  Respiratory: Scattered rales  Abdomen: Soft, nontender, nondistended, positive bowel sounds  Musculoskeletal: No clubbing or cyanosis or edema  Data Reviewed: Basic Metabolic Panel:  Recent Labs Lab 09/01/14 1707 09/02/14 0504  09/03/14 0534  NA 140 136 138  K 4.3 4.6 4.9  CL 104 100* 100*  CO2 GLUCOSE 112* 221* 137*  BUN CREATININE 0.88 1.02 1.14  CALCIUM 9.8 9.5 9.4   Liver Function Tests:  Recent Labs Lab 09/01/14 1707 09/02/14 0504  AST 38 39  ALT 25 23  ALKPHOS 108 100  BILITOT 0.4 0.5  PROT 7.3 7.2  ALBUMIN 4.0 4.0   No results for input(s): LIPASE, AMYLASE in the last 168 hours. No results for input(s): AMMONIA in the last 168 hours. CBC:  Recent Labs Lab 09/01/14 1707 09/02/14 0504 09/03/14 0534 09/04/14 0428  WBC 6.2 4.1 16.1* 13.2*  NEUTROABS 3.4  --   --   --   HGB 12.5* 12.3* 11.9* 11.4*  HCT 38.4* 38.0* 37.0* 37.0*  MCV 85.1 84.8 85.3 85.5  PLT 332 288 332 314   Cardiac Enzymes:   No results for input(s): CKTOTAL, CKMB, CKMBINDEX, TROPONINI in the last 168 hours. BNP (last 3 results)  Recent Labs  01/17/14 0805 01/23/14 0510 01/25/14 1100  BNP 492.9* 127.1* 98.1    ProBNP (last 3 results)  Recent Labs  11/23/13 1458  PROBNP 97.5    CBG: No results for input(s): GLUCAP in the last 168 hours.  No results found for this or any previous visit (from the past 240 hour(s)).   Studies: Dg Chest 2 View  09/04/2014   CLINICAL DATA:  Shortness of breath.  Cough.  Sarcoidosis  EXAM: CHEST  2 VIEW  COMPARISON:  09/01/2014.  05/31/2014.  01/17/2014.  CT 01/22/2014.  FINDINGS: Mediastinum is stable. Stable hilar fullness consistent adenopathy. Diffuse unchanged bilateral dense pleural parenchymal thickening consistent with scarring noted in this patient with known sarcoidosis. No acute infiltrate. Pleural thickening consistent with scarring. No prominent pleural effusion or pneumothorax. Heart size is stable. No acute bony abnormality .  IMPRESSION: Bilateral hilar adenopathy and severe pulmonary scarring consistent with severe changes of chronic sarcoidosis. No interim change. No acute infiltrate.   Electronically Signed   By: Maisie Fus  Register   On:  09/04/2014 08:38    Scheduled Meds: . antiseptic oral rinse  7 mL Mouth Rinse q12n4p  . bisoprolol  10 mg Oral Daily  . budesonide-formoterol  2 puff Inhalation BID  . chlorhexidine  15 mL Mouth Rinse BID  . diltiazem  120 mg Oral Daily  . famotidine  20 mg Oral BID  . heparin  5,000 Units Subcutaneous 3 times per day  . ipratropium-albuterol  3 mL Nebulization Q6H  . levofloxacin  750 mg Oral Daily  . [START ON 09/05/2014] predniSONE  30 mg Oral Q breakfast  . sodium chloride  3 mL Intravenous Q12H    Continuous Infusions:    Time spent: 15 minutes  Hollice Espy  [facility] Pager [phone] 7PM-7AM, please contact night-coverage at [email or web address], password [facility] 09/04/2014, 5:42 PM  LOS: 3 days

## 2014-09-04 NOTE — Progress Notes (Signed)
ANTIBIOTIC CONSULT NOTE - FOLLOW UP  Pharmacy Consult for levaquin Indication: rule out pneumonia  Allergies  Allergen Reactions  . Clindamycin/Lincomycin Anaphylaxis and Swelling    Made face and tongue swell    Patient Measurements: Height: 5' 7.5" (171.5 cm) Weight: 182 lb 12.8 oz (82.918 kg) IBW/kg (Calculated) : 67.25  Vital Signs: Temp: 98.5 F (36.9 C) (08/24 0430) Temp Source: Oral (08/24 0430) BP: 143/96 mmHg (08/24 0430) Pulse Rate: 85 (08/24 0430) Intake/Output from previous day: 08/23 0701 - 08/24 0700 In: 1110 [P.O.:960; IV Piggyback:150] Out: 1400 [Urine:1400] Intake/Output from this shift:    Labs:  Recent Labs  09/01/14 1707 09/02/14 0504 09/03/14 0534 09/04/14 0428  WBC 6.2 4.1 16.1* 13.2*  HGB 12.5* 12.3* 11.9* 11.4*  PLT 332 288 332 314  CREATININE 0.88 1.02 1.14  --    Estimated Creatinine Clearance: 81.5 mL/min (by C-G formula based on Cr of 1.14). No results for input(s): VANCOTROUGH, VANCOPEAK, VANCORANDOM, GENTTROUGH, GENTPEAK, GENTRANDOM, TOBRATROUGH, TOBRAPEAK, TOBRARND, AMIKACINPEAK, AMIKACINTROU, AMIKACIN in the last 72 hours.   Microbiology: No results found for this or any previous visit (from the past 720 hour(s)).  Anti-infectives    Start     Dose/Rate Route Frequency Ordered Stop   09/02/14 1800  levofloxacin (LEVAQUIN) IVPB 750 mg     750 mg 100 mL/hr over 90 Minutes Intravenous Every 24 hours 09/01/14 1736     09/01/14 1745  levofloxacin (LEVAQUIN) IVPB 750 mg     750 mg 100 mL/hr over 90 Minutes Intravenous STAT 09/01/14 1735 09/01/14 1953      Assessment: Patient is a 49 y.o with hx sarcoidosis currently on levaquin for suspected PNA.  Today, 09/04/2014: - Afeb, wbc down 13.2 (on IV steroids), scr slightly trending up (crcl >50) - 8/24 CXR: consistent with chronic carcoidosis, no acute infiltrates  8/21 >> Levaquin Rx>>  No cultures  Plan:  - continue levaquin  IV q24h - with no infiltrates noted on CXR,  consider d/c abx - pharmacy will sign off note writing, but will continue to follow patient peripherally and adjust dose if/when appropriate  Ayasha Ellingsen P 09/04/2014,9:56 AM

## 2014-09-04 NOTE — Progress Notes (Signed)
   Name: [name] MRN: [id number] DOB: 1965/05/18    ADMISSION DATE:  09/01/2014 CONSULTATION DATE:  09/02/14  REFERRING MD :  Dr. Rhona Leavens   CHIEF COMPLAINT:  Exertional dyspnea, hypoxemia   BRIEF PATIENT DESCRIPTION: 49 y/o M with PMH of sarcoidois, OSA on CPAP who was admitted 8/21 with exertional dyspnea and hypoxemia in the setting of possible CAP.  PCCM consulted for evaluation.   SIGNIFICANT EVENTS  8/21  Admit with exertional dyspnea  STUDIES:  8/21  CXR >> chronic changes in both lungs, mild increase in L mid lung infiltrate   SUBJECTIVE:  Feeling better today, wants to walk  VITAL SIGNS: Temp:  [97.6 F (36.4 C)-98.5 F (36.9 C)] 98.5 F (36.9 C) (08/24 0430) Pulse Rate:  [85-88] 85 (08/24 0430) Resp:  [21-22] 21 (08/24 0430) BP: (137-148)/(94-98) 143/96 mmHg (08/24 0430) SpO2:  [98 %-100 %] 99 % (08/24 0752)  PHYSICAL EXAMINATION: General:  No distress, in bed HENT: NCAT EOMi PULM: Crackles bases, poor air movement, no distress CV: RRR, no mgr GI: BS+, soft, nontender MSK: normal bulk and tone Neuro: A&Ox4, maew   Recent Labs Lab 09/01/14 1707 09/02/14 0504 09/03/14 0534  NA 140 136 138  K 4.3 4.6 4.9  CL 104 100* 100*  CO2 BUN CREATININE 0.88 1.02 1.14  GLUCOSE 112* 221* 137*    Recent Labs Lab 09/02/14 0504 09/03/14 0534 09/04/14 0428  HGB 12.3* 11.9* 11.4*  HCT 38.0* 37.0* 37.0*  WBC 4.1 16.1* 13.2*  PLT 288 332 314   CXR from today personally reviewed> chronic scarring, bleb bilaterally, no change today, no acute infiltrate compared to prior studies  ASSESSMENT / PLAN:  Acute on Chronic Hypoxemic Respiratory Failure - initial O2 saturations recorded as 86% on RA due to a bronchiectasis flare > improving Exertional Dyspnea  Sarcoidosis / Bronchiectasis  Grade I Diastolic Dysfunction - as seen on ECHO 12/2013, no PAH   Plan: Walk today Continue bronchodilators as scheduled Add back symbicort Continue  Levaquin for 7 day course Oxygen at 3-4L (baseline)  Continue Mucinex, vest today bid, plan flutter valve at discharge D/c solumedrol Start prednisone  > plan taper off over 14 days Assess ambulatory saturations on O2 prior to discharge to ensure no up titration of O2 needed   OSA  Plan: Continue nocturnal CPAP   Bloating  Melena ? No anemia noted   Plan: FOB stool > ordered again today Defer further management to primary service  Monitor Hgb, appears to be at baseline (~12)  Heber Kyle, MD [facility] Pager: [phone] Cell: [phone] After 3pm or if no response, call [phone]

## 2014-09-04 NOTE — Progress Notes (Signed)
Pt 02 sat was 100% at rest on 3L before ambulation, pt ambulated around unit a few times and 02 sat was 98% on 3L, 02 sat did drop to 87% on 3L a couple of times but would come right back up. After ambulating pt 02 sat was 100% on 3L.  09/04/2014 Lenox Ponds, RN

## 2014-09-04 NOTE — Progress Notes (Signed)
Pt wants to hold off on Chest Vest at this time.  Pt states that he just finished eating dinner alittle while ago.  RT to monitor and assess as needed.

## 2014-09-05 DIAGNOSIS — J189 Pneumonia, unspecified organism: Principal | ICD-10-CM

## 2014-09-05 MED ORDER — LEVOFLOXACIN 750 MG PO TABS: 750.0000 mg | ORAL_TABLET | Freq: Every day | ORAL | Status: DC

## 2014-09-05 MED ORDER — PREDNISONE 10 MG PO TABS
ORAL_TABLET | ORAL | Status: DC
Start: 2014-09-05 — End: 2014-10-03

## 2014-09-05 NOTE — Discharge Summary (Signed)
Discharge Summary  [name] ZOX:[id number] DOB: [birth year]  PCP: Clydie Braun, MD  Admit date: 09/01/2014 Discharge date: 09/05/2014  Time spent: 25 minutes  Recommendations for Outpatient Follow-up:  1. New medication: Prednisone taper over next 14 days 2. New medication: Levaquin 750 mg by mouth daily 3 more days   Discharge Diagnoses:  Active Hospital Problems   Diagnosis Date Noted  . Bronchiectasis with acute exacerbation   . Sarcoidosis 09/01/2014  . CAP (community acquired pneumonia) 09/01/2014  . SOB (shortness of breath) 03/23/2014  . Essential hypertension   . Acute on chronic respiratory failure with hypoxia 01/17/2014  . HTN (hypertension) 03/05/2012  . Sarcoid 01/15/2011  . OSA on CPAP 01/15/2011    Resolved Hospital Problems   Diagnosis Date Noted Date Resolved  No resolved problems to display.    Discharge Condition: Improved, being discharged home  Diet recommendation: Heart healthy  Filed Weights   09/01/14 2000  Weight: 82.918 kg (182 lb 12.8 oz)    History of present illness:  49 year old patient with past history of sarcoid on chronic oxygen admitted on 8/21 for acute respiratory failure felt to be secondary to bronchiectasis flare. Pulmonary consulted. Patient started on broncho-dilators, IV steroids and supplemental increased oxygen.  Hospital Course:  Active Problems:    OSA on CPAP: Continued on nightly CPAP    Acute on chronic respiratory failure with hypoxia secondary to sarcoidosis with bronchiectasis flare: Overnight she days, patient continued to improve. Solu-Medrol changed to by mouth prednisone. Seen by pulmonary. Using breathing treatments, vest, steroids patient able to return back to baseline for oxygen (2-3 L nasal cannula). He will follow-up with his pulmonologist in the next few weeks   SOB (shortness of breath)   Essential hypertension: Blood pressure stable    CAP (community acquired pneumonia): Patient  discharged on few more days of Levaquin to complete 7 day course    Procedures:  None  Consultations:  None  Discharge Exam: BP 127/94 mmHg  Pulse 75  Temp(Src) 98 F (36.7 C) (Oral)  Resp 20  Ht 5' 7.5" (1.715 m)  Wt 82.918 kg (182 lb 12.8 oz)  BMI 28.19 kg/m2  SpO2 98%  General: Alert and oriented 3, no acute distress Cardiovascular: Regular rate and rhythm, S1-S2 Respiratory: Clear to auscultation bilaterally  Discharge Instructions You were cared for by a hospitalist during your hospital stay. If you have any questions about your discharge medications or the care you received while you were in the hospital after you are discharged, you can call the unit and asked to speak with the hospitalist on call if the hospitalist that took care of you is not available. Once you are discharged, your primary care physician will handle any further medical issues. Please note that NO REFILLS for any discharge medications will be authorized once you are discharged, as it is imperative that you return to your primary care physician (or establish a relationship with a primary care physician if you do not have one) for your aftercare needs so that they can reassess your need for medications and monitor your lab values.  Discharge Instructions    Diet - low sodium heart healthy    Complete by:  As directed      Increase activity slowly    Complete by:  As directed             Medication List    STOP taking these medications        cholecalciferol 1000 UNITS tablet  Commonly known as:  VITAMIN D      TAKE these medications        albuterol (2.5 MG/3ML) 0.083% nebulizer solution  Commonly known as:  PROVENTIL  Take 2.5 mg by nebulization every 4 (four) hours as needed for wheezing or shortness of breath (wheezing and shortness of breath).     albuterol 108 (90 BASE) MCG/ACT inhaler  Commonly known as:  PROAIR HFA  Inhale 2 puffs into the lungs every 6 (six) hours as needed for  wheezing or shortness of breath.     bisacodyl 5 MG EC tablet  Commonly known as:  bisacodyl  Take 1 tablet (5 mg total) by mouth daily as needed for moderate constipation.     bisoprolol 10 MG tablet  Commonly known as:  ZEBETA  Take 1 tablet (10 mg total) by mouth daily.     budesonide-formoterol 160-4.5 MCG/ACT inhaler  Commonly known as:  SYMBICORT  Inhale 2 puffs into the lungs 2 (two) times daily.     calcium-vitamin D 500-200 MG-UNIT per tablet  Commonly known as:  OSCAL WITH D  Take 1 tablet by mouth every morning.     cyclobenzaprine 10 MG tablet  Commonly known as:  FLEXERIL  Take 10 mg by mouth 3 (three) times daily as needed for muscle spasms (muscle spasms).     diltiazem 120 MG 24 hr capsule  Commonly known as:  CARDIZEM CD  Take 1 capsule (120 mg total) by mouth daily.     diphenhydrAMINE 25 MG tablet  Commonly known as:  BENADRYL  Take 25 mg by mouth every 6 (six) hours as needed for allergies (allergies).     famotidine 20 MG tablet  Commonly known as:  PEPCID  Take 1 tablet (20 mg total) by mouth 2 (two) times daily.     ferrous sulfate 325 (65 FE) MG tablet  Take 1 tablet (325 mg total) by mouth 2 (two) times daily with a meal.     fluticasone 50 MCG/ACT nasal spray  Commonly known as:  FLONASE  Place 2 sprays into both nostrils 2 (two) times daily.     guaiFENesin 600 MG 12 hr tablet  Commonly known as:  MUCINEX  Take 1 tablet (600 mg total) by mouth 2 (two) times daily as needed (For congestion.).     levofloxacin 750 MG tablet  Commonly known as:  LEVAQUIN  Take 1 tablet (750 mg total) by mouth daily.     multivitamin with minerals tablet  Take 1 tablet by mouth every morning.     polyethylene glycol packet  Commonly known as:  MIRALAX / GLYCOLAX  Take 17 g by mouth daily.     predniSONE 10 MG tablet  Commonly known as:  DELTASONE  30 mg po daily x 4 days, then 20mg  po daily x 5 days, then 10mg  po daily x 527 days     promethazine 25  MG tablet  Commonly known as:  PHENERGAN  Take 25 mg by mouth every 6 (six) hours as needed for nausea (nausea).       Allergies  Allergen Reactions  . Clindamycin/Lincomycin Anaphylaxis and Swelling    Made face and tongue swell       Follow-up Information    Follow up with Leslye Peer., MD On 09/24/2014.   Specialty:  Pulmonary Disease   Why:  at  3:45pm  For Post Hospitalization Follow Up   Contact information:   [address] [place]  [address] [phone]        The results of significant diagnostics from this hospitalization (including imaging, microbiology, ancillary and laboratory) are listed below for reference.    Significant Diagnostic Studies: Dg Chest 2 View  09/04/2014   CLINICAL DATA:  Shortness of breath.  Cough.  Sarcoidosis  EXAM: CHEST  2 VIEW  COMPARISON:  09/01/2014.  05/31/2014.  01/17/2014.  CT 01/22/2014.  FINDINGS: Mediastinum is stable. Stable hilar fullness consistent adenopathy. Diffuse unchanged bilateral dense pleural parenchymal thickening consistent with scarring noted in this patient with known sarcoidosis. No acute infiltrate. Pleural thickening consistent with scarring. No prominent pleural effusion or pneumothorax. Heart size is stable. No acute bony abnormality .  IMPRESSION: Bilateral hilar adenopathy and severe pulmonary scarring consistent with severe changes of chronic sarcoidosis. No interim change. No acute infiltrate.   Electronically Signed   By: Maisie Fus  Register   On: 09/04/2014 08:38   Dg Chest Portable 1 View  09/01/2014   CLINICAL DATA:  Cough and congestion for 1 week  EXAM: PORTABLE CHEST - 1 VIEW  COMPARISON:  05/31/2014  FINDINGS: Cardiac shadow is stable. Chronic changes are again noted in both lungs. Some increased density in the left mid lung is noted associated with the areas of scarring which may represent acute on chronic infiltrate. No sizable effusion is seen.  IMPRESSION: Chronic changes in both lungs. Slight  increased density is noted in the left mid lung which may be related to acute on chronic infiltrate   Electronically Signed   By: Alcide Clever M.D.   On: 09/01/2014 17:22    Microbiology: No results found for this or any previous visit (from the past 240 hour(s)).   Labs: Basic Metabolic Panel:  Recent Labs Lab 09/01/14 1707 09/02/14 0504 09/03/14 0534  NA 140 136 138  K 4.3 4.6 4.9  CL 104 100* 100*  CO2 GLUCOSE 112* 221* 137*  BUN CREATININE 0.88 1.02 1.14  CALCIUM 9.8 9.5 9.4   Liver Function Tests:  Recent Labs Lab 09/01/14 1707 09/02/14 0504  AST 38 39  ALT 25 23  ALKPHOS 108 100  BILITOT 0.4 0.5  PROT 7.3 7.2  ALBUMIN 4.0 4.0   No results for input(s): LIPASE, AMYLASE in the last 168 hours. No results for input(s): AMMONIA in the last 168 hours. CBC:  Recent Labs Lab 09/01/14 1707 09/02/14 0504 09/03/14 0534 09/04/14 0428  WBC 6.2 4.1 16.1* 13.2*  NEUTROABS 3.4  --   --   --   HGB 12.5* 12.3* 11.9* 11.4*  HCT 38.4* 38.0* 37.0* 37.0*  MCV 85.1 84.8 85.3 85.5  PLT 332 288 332 314   Cardiac Enzymes: No results for input(s): CKTOTAL, CKMB, CKMBINDEX, TROPONINI in the last 168 hours. BNP: BNP (last 3 results)  Recent Labs  01/17/14 0805 01/23/14 0510 01/25/14 1100  BNP 492.9* 127.1* 98.1    ProBNP (last 3 results)  Recent Labs  11/23/13 1458  PROBNP 97.5    CBG: No results for input(s): GLUCAP in the last 168 hours.     Signed:  Hollice Espy  [facility] 09/05/2014, 4:52 PM

## 2014-09-05 NOTE — Progress Notes (Signed)
   Name: [name] MRN: [id number] DOB: 1965-12-29    ADMISSION DATE:  09/01/2014 CONSULTATION DATE:  09/02/14  REFERRING MD :  Dr. Rhona Leavens   CHIEF COMPLAINT:  Exertional dyspnea, hypoxemia   BRIEF PATIENT DESCRIPTION: 49 y/o M with PMH of sarcoidois, OSA on CPAP who was admitted 8/21 with exertional dyspnea and hypoxemia in the setting of possible CAP.  PCCM consulted for evaluation.   SIGNIFICANT EVENTS  8/21  Admit with exertional dyspnea  STUDIES:  8/21  CXR >> chronic changes in both lungs, mild increase in L mid lung infiltrate 8/24  CXR >> bilateral hilar adenopathy & scarring, no acute infiltrate  SUBJECTIVE:  Pt reports he is ready to go home. Sitting on side of bed in his clothes. On baseline O2, 3L  VITAL SIGNS: Temp:  [97.6 F (36.4 C)-98 F (36.7 C)] 98 F (36.7 C) (08/25 0947) Pulse Rate:  [75-86] 75 (08/25 0947) Resp:  [20] 20 (08/25 0541) BP: (121-144)/(83-94) 127/94 mmHg (08/25 0947) SpO2:  [94 %-100 %] 98 % (08/25 0947)  PHYSICAL EXAMINATION: General:  No distress, sitting on side of bed HENT: NCAT EOMi PULM: resp's even/non-labored, good air movement, occasional wheeze L anterior  CV: RRR, no mgr GI: BS+, soft, nontender MSK: normal bulk and tone Neuro: A&Ox4, maew   Recent Labs Lab 09/01/14 1707 09/02/14 0504 09/03/14 0534  NA 140 136 138  K 4.3 4.6 4.9  CL 104 100* 100*  CO2 BUN CREATININE 0.88 1.02 1.14  GLUCOSE 112* 221* 137*    Recent Labs Lab 09/02/14 0504 09/03/14 0534 09/04/14 0428  HGB 12.3* 11.9* 11.4*  HCT 38.0* 37.0* 37.0*  WBC 4.1 16.1* 13.2*  PLT 288 332 314     ASSESSMENT / PLAN:  Acute on Chronic Hypoxemic Respiratory Failure - initial O2 saturations recorded as 86% on RA due to a bronchiectasis flare > improving Exertional Dyspnea  Sarcoidosis / Bronchiectasis  Grade I Diastolic Dysfunction - as seen on ECHO 12/2013, no PAH   Plan: Continue bronchodilators as scheduled Continue  symbicort Continue Levaquin for 7 day course Oxygen at 3-4L (baseline)  Continue Mucinex, vest today bid, plan flutter valve at discharge (he states he has a flutter valve at home) Prednisone  > plan taper off over 14 days Follow up arranged with Dr. Delton Coombes post hospital.     OSA  Plan: Continue nocturnal CPAP   Bloating  Melena ? No anemia noted   Plan: FOB stool > ordered again today Defer further management to primary service  Monitor Hgb, appears to be at baseline (~12)   PCCM will sign off.  Please call back if new needs arise.     Canary Brim, NP-C [facility] Pgr: [phone] or if no answer [phone] 09/05/2014, 10:02 AM   Attending: I have seen and examined the patient with nurse practitioner/resident and agree with the note above.   [name] is doing much better, breathing back to baseline.  Lungs with improved air movement, few wheezes.    Agree with 7 day course of levaquin Use flutter at home for mucociliary clearance, but he is asking about vest.  Would consider as an outpatient but he only needed it a couple of times here, so not sure it is needed at home yet (exceedingly expensive device).  Heber Gladewater, MD [facility] Pager: [phone] Cell: [phone] After 3pm or if no response, call [phone]

## 2014-09-10 ENCOUNTER — Other Ambulatory Visit: Payer: Self-pay | Admitting: *Deleted

## 2014-09-10 MED ORDER — ALBUTEROL SULFATE HFA 108 (90 BASE) MCG/ACT IN AERS: 2.0000 | INHALATION_SPRAY | Freq: Four times a day (QID) | RESPIRATORY_TRACT | Status: DC | PRN

## 2014-09-24 ENCOUNTER — Inpatient Hospital Stay: Payer: Commercial Managed Care - HMO | Admitting: Emergency Medicine

## 2014-09-29 ENCOUNTER — Emergency Department (HOSPITAL_COMMUNITY): Payer: Commercial Managed Care - HMO

## 2014-09-29 ENCOUNTER — Inpatient Hospital Stay (HOSPITAL_COMMUNITY)
Admission: EM | Admit: 2014-09-29 | Discharge: 2014-10-03 | DRG: 194 | Disposition: A | Discharge status: Home / Self Care | Payer: Commercial Managed Care - HMO | Attending: Emergency Medicine | Admitting: Emergency Medicine

## 2014-09-29 ENCOUNTER — Encounter (HOSPITAL_COMMUNITY): Payer: Self-pay | Admitting: *Deleted

## 2014-09-29 DIAGNOSIS — D869 Sarcoidosis, unspecified: Secondary | ICD-10-CM

## 2014-09-29 DIAGNOSIS — K219 Gastro-esophageal reflux disease without esophagitis: Secondary | ICD-10-CM | POA: Diagnosis present

## 2014-09-29 DIAGNOSIS — Z79899 Other long term (current) drug therapy: Secondary | ICD-10-CM | POA: Diagnosis not present

## 2014-09-29 DIAGNOSIS — K5900 Constipation, unspecified: Secondary | ICD-10-CM | POA: Diagnosis present

## 2014-09-29 DIAGNOSIS — F419 Anxiety disorder, unspecified: Secondary | ICD-10-CM | POA: Diagnosis present

## 2014-09-29 DIAGNOSIS — Z9989 Dependence on other enabling machines and devices: Secondary | ICD-10-CM

## 2014-09-29 DIAGNOSIS — D509 Iron deficiency anemia, unspecified: Secondary | ICD-10-CM | POA: Diagnosis present

## 2014-09-29 DIAGNOSIS — F1410 Cocaine abuse, uncomplicated: Secondary | ICD-10-CM | POA: Diagnosis present

## 2014-09-29 DIAGNOSIS — R0600 Dyspnea, unspecified: Secondary | ICD-10-CM | POA: Diagnosis present

## 2014-09-29 DIAGNOSIS — D860 Sarcoidosis of lung: Secondary | ICD-10-CM | POA: Diagnosis present

## 2014-09-29 DIAGNOSIS — D638 Anemia in other chronic diseases classified elsewhere: Secondary | ICD-10-CM | POA: Diagnosis present

## 2014-09-29 DIAGNOSIS — Z881 Allergy status to other antibiotic agents status: Secondary | ICD-10-CM | POA: Diagnosis not present

## 2014-09-29 DIAGNOSIS — Z23 Encounter for immunization: Secondary | ICD-10-CM

## 2014-09-29 DIAGNOSIS — G8929 Other chronic pain: Secondary | ICD-10-CM | POA: Diagnosis present

## 2014-09-29 DIAGNOSIS — Z7952 Long term (current) use of systemic steroids: Secondary | ICD-10-CM | POA: Diagnosis not present

## 2014-09-29 DIAGNOSIS — J45909 Unspecified asthma, uncomplicated: Secondary | ICD-10-CM | POA: Diagnosis present

## 2014-09-29 DIAGNOSIS — I10 Essential (primary) hypertension: Secondary | ICD-10-CM | POA: Diagnosis present

## 2014-09-29 DIAGNOSIS — R739 Hyperglycemia, unspecified: Secondary | ICD-10-CM | POA: Diagnosis present

## 2014-09-29 DIAGNOSIS — G4733 Obstructive sleep apnea (adult) (pediatric): Secondary | ICD-10-CM | POA: Diagnosis not present

## 2014-09-29 DIAGNOSIS — J9621 Acute and chronic respiratory failure with hypoxia: Secondary | ICD-10-CM | POA: Diagnosis present

## 2014-09-29 DIAGNOSIS — J189 Pneumonia, unspecified organism: Principal | ICD-10-CM | POA: Diagnosis present

## 2014-09-29 DIAGNOSIS — Y95 Nosocomial condition: Secondary | ICD-10-CM | POA: Diagnosis present

## 2014-09-29 DIAGNOSIS — J9610 Chronic respiratory failure, unspecified whether with hypoxia or hypercapnia: Secondary | ICD-10-CM | POA: Diagnosis present

## 2014-09-29 DIAGNOSIS — J449 Chronic obstructive pulmonary disease, unspecified: Secondary | ICD-10-CM | POA: Diagnosis present

## 2014-09-29 DIAGNOSIS — M549 Dorsalgia, unspecified: Secondary | ICD-10-CM | POA: Diagnosis present

## 2014-09-29 DIAGNOSIS — Z833 Family history of diabetes mellitus: Secondary | ICD-10-CM | POA: Diagnosis not present

## 2014-09-29 LAB — COMPREHENSIVE METABOLIC PANEL
ALK PHOS: 85 U/L (ref 38–126)
ALT: 23 U/L (ref 17–63)
AST: 29 U/L (ref 15–41)
Albumin: 3.7 g/dL (ref 3.5–5.0)
Anion gap: 5 (ref 5–15)
BUN: 10 mg/dL (ref 6–20)
CHLORIDE: 100 mmol/L — AB (ref 101–111)
CO2: 31 mmol/L (ref 22–32)
CREATININE: 1.02 mg/dL (ref 0.61–1.24)
Calcium: 9.1 mg/dL (ref 8.9–10.3)
GFR calc Af Amer: >60 mL/min (ref >60)
GFR calc non Af Amer: >60 mL/min (ref >60)
GLUCOSE: 127 mg/dL — AB (ref 65–99)
Potassium: 3.8 mmol/L (ref 3.5–5.1)
SODIUM: 136 mmol/L (ref 135–145)
Total Bilirubin: 0.4 mg/dL (ref 0.3–1.2)
Total Protein: 7 g/dL (ref 6.5–8.1)

## 2014-09-29 LAB — CBC WITH DIFFERENTIAL/PLATELET
BASOS ABS: 0 10*3/uL (ref 0.0–0.1)
Basophils Relative: 0 %
EOS ABS: 0.2 10*3/uL (ref 0.0–0.7)
EOS PCT: 3 %
HCT: 35.4 % — ABNORMAL LOW (ref 39.0–52.0)
HEMOGLOBIN: 11.3 g/dL — AB (ref 13.0–17.0)
LYMPHS PCT: 15 %
Lymphs Abs: 1.2 10*3/uL (ref 0.7–4.0)
MCH: 27.2 pg (ref 26.0–34.0)
MCHC: 31.9 g/dL (ref 30.0–36.0)
MCV: 85.3 fL (ref 78.0–100.0)
Monocytes Absolute: 1 10*3/uL (ref 0.1–1.0)
Monocytes Relative: 13 %
NEUTROS PCT: 69 %
Neutro Abs: 5.8 10*3/uL (ref 1.7–7.7)
PLATELETS: 279 10*3/uL (ref 150–400)
RBC: 4.15 MIL/uL — AB (ref 4.22–5.81)
RDW: 12.8 % (ref 11.5–15.5)
WBC: 8.3 10*3/uL (ref 4.0–10.5)

## 2014-09-29 LAB — EXPECTORATED SPUTUM ASSESSMENT W REFEX TO RESP CULTURE

## 2014-09-29 LAB — EXPECTORATED SPUTUM ASSESSMENT W GRAM STAIN, RFLX TO RESP C

## 2014-09-29 LAB — MRSA PCR SCREENING: MRSA by PCR: NEGATIVE

## 2014-09-29 MED ORDER — SODIUM CHLORIDE 0.9 % IV SOLN
INTRAVENOUS | Status: DC
  Administered 2014-09-29: 30 mL via INTRAVENOUS

## 2014-09-29 MED ORDER — ALBUTEROL SULFATE (2.5 MG/3ML) 0.083% IN NEBU
2.5000 mg | INHALATION_SOLUTION | RESPIRATORY_TRACT | Status: DC | PRN
  Administered 2014-09-29 – 2014-10-03 (×13): 2.5 mg via RESPIRATORY_TRACT
  Filled 2014-09-29 (×13): qty 3

## 2014-09-29 MED ORDER — ALBUTEROL SULFATE (2.5 MG/3ML) 0.083% IN NEBU
5.0000 mg | INHALATION_SOLUTION | Freq: Once | RESPIRATORY_TRACT | Status: AC
  Administered 2014-09-29: 5 mg via RESPIRATORY_TRACT
  Filled 2014-09-29: qty 6

## 2014-09-29 MED ORDER — HYDROCODONE-ACETAMINOPHEN 5-325 MG PO TABS
1.0000 | ORAL_TABLET | Freq: Four times a day (QID) | ORAL | Status: DC | PRN
  Administered 2014-09-29: 1 via ORAL
  Administered 2014-09-30: 2 via ORAL
  Administered 2014-09-30: 1 via ORAL
  Administered 2014-10-01 – 2014-10-03 (×4): 2 via ORAL
  Filled 2014-09-29 (×2): qty 2
  Filled 2014-09-29: qty 1
  Filled 2014-09-29 (×4): qty 2

## 2014-09-29 MED ORDER — FAMOTIDINE 20 MG PO TABS
20.0000 mg | ORAL_TABLET | Freq: Two times a day (BID) | ORAL | Status: DC
  Administered 2014-09-29: 20 mg via ORAL
  Filled 2014-09-29: qty 1

## 2014-09-29 MED ORDER — BUDESONIDE-FORMOTEROL FUMARATE 160-4.5 MCG/ACT IN AERO
2.0000 | INHALATION_SPRAY | Freq: Two times a day (BID) | RESPIRATORY_TRACT | Status: DC
  Administered 2014-09-30 – 2014-10-03 (×6): 2 via RESPIRATORY_TRACT
  Filled 2014-09-29: qty 6

## 2014-09-29 MED ORDER — DEXTROSE 5 % IV SOLN
2.0000 g | Freq: Three times a day (TID) | INTRAVENOUS | Status: DC
  Administered 2014-09-29 – 2014-10-02 (×8): 2 g via INTRAVENOUS
  Filled 2014-09-29 (×10): qty 2

## 2014-09-29 MED ORDER — GUAIFENESIN ER 600 MG PO TB12
600.0000 mg | ORAL_TABLET | Freq: Two times a day (BID) | ORAL | Status: DC | PRN
  Administered 2014-09-30 – 2014-10-03 (×6): 600 mg via ORAL
  Filled 2014-09-29 (×7): qty 1

## 2014-09-29 MED ORDER — AZITHROMYCIN 250 MG PO TABS
500.0000 mg | ORAL_TABLET | Freq: Once | ORAL | Status: AC
  Administered 2014-09-29: 500 mg via ORAL
  Filled 2014-09-29: qty 2

## 2014-09-29 MED ORDER — METHYLPREDNISOLONE SODIUM SUCC 125 MG IJ SOLR
80.0000 mg | Freq: Two times a day (BID) | INTRAMUSCULAR | Status: DC
  Administered 2014-09-29 – 2014-10-02 (×6): 80 mg via INTRAVENOUS
  Filled 2014-09-29 (×6): qty 2

## 2014-09-29 MED ORDER — METHYLPREDNISOLONE SODIUM SUCC 125 MG IJ SOLR
125.0000 mg | Freq: Once | INTRAMUSCULAR | Status: AC
  Administered 2014-09-29: 125 mg via INTRAVENOUS
  Filled 2014-09-29: qty 2

## 2014-09-29 MED ORDER — PANTOPRAZOLE SODIUM 40 MG PO TBEC
40.0000 mg | DELAYED_RELEASE_TABLET | Freq: Every day | ORAL | Status: DC
  Administered 2014-09-30 – 2014-10-03 (×4): 40 mg via ORAL
  Filled 2014-09-29 (×5): qty 1

## 2014-09-29 MED ORDER — MAGNESIUM SULFATE 2 GM/50ML IV SOLN
2.0000 g | Freq: Once | INTRAVENOUS | Status: AC
  Administered 2014-09-29: 2 g via INTRAVENOUS
  Filled 2014-09-29: qty 50

## 2014-09-29 MED ORDER — VANCOMYCIN HCL IN DEXTROSE 1-5 GM/200ML-% IV SOLN
1000.0000 mg | Freq: Three times a day (TID) | INTRAVENOUS | Status: DC
  Administered 2014-09-29 – 2014-10-02 (×8): 1000 mg via INTRAVENOUS
  Filled 2014-09-29 (×9): qty 200

## 2014-09-29 MED ORDER — FLUTICASONE PROPIONATE 50 MCG/ACT NA SUSP
2.0000 | Freq: Two times a day (BID) | NASAL | Status: DC
  Administered 2014-09-30 – 2014-10-03 (×8): 2 via NASAL
  Filled 2014-09-29 (×2): qty 16

## 2014-09-29 MED ORDER — HYDROCODONE-HOMATROPINE 5-1.5 MG/5ML PO SYRP
5.0000 mL | ORAL_SOLUTION | Freq: Four times a day (QID) | ORAL | Status: DC | PRN
  Administered 2014-09-29 – 2014-10-03 (×8): 5 mL via ORAL
  Filled 2014-09-29 (×8): qty 5

## 2014-09-29 MED ORDER — HEPARIN SODIUM (PORCINE) 5000 UNIT/ML IJ SOLN
5000.0000 [IU] | Freq: Three times a day (TID) | INTRAMUSCULAR | Status: DC
  Administered 2014-09-29 – 2014-10-03 (×11): 5000 [IU] via SUBCUTANEOUS
  Filled 2014-09-29 (×11): qty 1

## 2014-09-29 MED ORDER — CYCLOBENZAPRINE HCL 10 MG PO TABS
10.0000 mg | ORAL_TABLET | Freq: Three times a day (TID) | ORAL | Status: DC | PRN
  Administered 2014-09-30 – 2014-10-02 (×2): 10 mg via ORAL
  Filled 2014-09-29 (×2): qty 1

## 2014-09-29 MED ORDER — POLYETHYLENE GLYCOL 3350 17 G PO PACK: 17.0000 g | PACK | Freq: Every day | ORAL | Status: DC | PRN

## 2014-09-29 MED ORDER — FAMOTIDINE 20 MG PO TABS
20.0000 mg | ORAL_TABLET | Freq: Every day | ORAL | Status: DC
  Administered 2014-09-30 – 2014-10-02 (×3): 20 mg via ORAL
  Filled 2014-09-29 (×3): qty 1

## 2014-09-29 MED ORDER — DEXTROSE 5 % IV SOLN
1.0000 g | Freq: Once | INTRAVENOUS | Status: AC
  Administered 2014-09-29: 1 g via INTRAVENOUS
  Filled 2014-09-29: qty 10

## 2014-09-29 NOTE — ED Notes (Signed)
Bed: WA03 Expected date:  Expected time:  Means of arrival:  Comments: Tr 1

## 2014-09-29 NOTE — ED Provider Notes (Signed)
CSN: [id number]     Arrival date & time 09/29/14  1256 History   First MD Initiated Contact with Patient 09/29/14 1351     Chief Complaint  Patient presents with  . Shortness of Breath     HPI Pt reports SOB with pain in his L chest with inspiration. Pt with hx of sarcoidosis. Pt also reports coughing up brownish green sputum. Has had pneumo in his L lung in the past. Past Medical History  Diagnosis Date  . Hypertension   . Sarcoidosis   . OSA on CPAP   . Chronic respiratory failure     home O@ 4L (01/28/14)  . Bronchitis   . Pneumonia   . COPD (chronic obstructive pulmonary disease)   . Neuromuscular disorder    Past Surgical History  Procedure Laterality Date  . Rotator cuff repair    . Arm surgery     . Video assisted thoracoscopy (vats)/thorocotomy Left 07/17/2012    Procedure: VIDEO ASSISTED THORACOSCOPY (VATS)/BLEB Stapling;  Surgeon: Alleen Borne, MD;  Location: [facility] OR;  Service: Thoracic;  Laterality: Left;   Family History  Problem Relation Age of Onset  . Diabetes Father    Social History  Substance Use Topics  . Smoking status: Never Smoker   . Smokeless tobacco: Never Used  . Alcohol Use: Yes     Comment: social    Review of Systems  Respiratory: Positive for cough, shortness of breath and wheezing.       Allergies  Clindamycin/lincomycin  Home Medications   Prior to Admission medications   Medication Sig Start Date End Date Taking? Authorizing Provider  albuterol (PROVENTIL HFA;VENTOLIN HFA) 108 (90 BASE) MCG/ACT inhaler Inhale 2 puffs into the lungs every 6 (six) hours as needed for wheezing or shortness of breath. 09/10/14  Yes Leslye Peer, MD  albuterol (PROVENTIL) (2.5 MG/3ML) 0.083% nebulizer solution Take 2.5 mg by nebulization every 4 (four) hours as needed for wheezing or shortness of breath (wheezing and shortness of breath).  06/08/12  Yes Historical Provider, MD  bisacodyl (BISACODYL) 5 MG EC tablet Take 1 tablet (5 mg total) by mouth  daily as needed for moderate constipation. 01/16/14  Yes Simonne Martinet, NP  budesonide-formoterol [facility]) 160-4.5 MCG/ACT inhaler Inhale 2 puffs into the lungs 2 (two) times daily. 11/28/13  Yes Jeanella Craze, NP  calcium-vitamin D (OSCAL WITH D) 500-200 MG-UNIT per tablet Take 1 tablet by mouth every morning.   Yes Historical Provider, MD  cyclobenzaprine (FLEXERIL) 10 MG tablet Take 10 mg by mouth 3 (three) times daily as needed for muscle spasms (muscle spasms).    Yes Historical Provider, MD  diphenhydrAMINE (BENADRYL) 25 MG tablet Take 25 mg by mouth every 6 (six) hours as needed for allergies (allergies).    Yes Historical Provider, MD  famotidine (PEPCID) 20 MG tablet Take 1 tablet (20 mg total) by mouth 2 (two) times daily. 03/08/12  Yes Renae Fickle, MD  ferrous sulfate 325 (65 FE) MG tablet Take 1 tablet (325 mg total) by mouth 2 (two) times daily with a meal. 06/18/12  Yes Leroy Sea, MD  fluticasone (FLONASE) 50 MCG/ACT nasal spray Place 2 sprays into both nostrils 2 (two) times daily. Patient taking differently: Place 1 spray into both nostrils daily as needed for allergies.  01/28/14  Yes Jeanella Craze, NP  guaiFENesin (MUCINEX) 600 MG 12 hr tablet Take 1 tablet (600 mg total) by mouth 2 (two) times daily as needed (For congestion.).  06/19/14  Yes Leslye Peer, MD  levofloxacin (LEVAQUIN) 750 MG tablet Take 1 tablet (750 mg total) by mouth daily. 09/05/14  Yes Hollice Espy, MD  Multiple Vitamins-Minerals (MULTIVITAMIN WITH MINERALS) tablet Take 1 tablet by mouth every morning.    Yes Historical Provider, MD  ondansetron (ZOFRAN) 4 MG tablet Take 4 mg by mouth every 8 (eight) hours as needed for nausea or vomiting.  09/05/14  Yes Historical Provider, MD  polyethylene glycol (MIRALAX / GLYCOLAX) packet Take 17 g by mouth daily. Patient taking differently: Take 17 g by mouth daily as needed for mild constipation or moderate constipation.  01/16/14  Yes Simonne Martinet, NP   predniSONE (DELTASONE) 10 MG tablet 30 mg po daily x 4 days, then  po daily x 5 days, then  po daily x 527 days 09/05/14  Yes Hollice Espy, MD  bisoprolol (ZEBETA) 10 MG tablet Take 1 tablet (10 mg total) by mouth daily. Patient not taking: Reported on 09/29/2014 03/26/14   Nita Sells Mikhail, DO  diltiazem (CARDIZEM CD) 120 MG 24 hr capsule Take 1 capsule (120 mg total) by mouth daily. Patient not taking: Reported on 09/29/2014 01/28/14   Jeanella Craze, NP   BP 137/90 mmHg  Pulse 81  Temp(Src) 98.1 F (36.7 C) (Oral)  Resp 20  Ht 5' 7.5" (1.715 m)  Wt 181 lb (82.1 kg)  BMI 27.91 kg/m2  SpO2 99% Physical Exam  Constitutional: He is oriented to person, place, and time. He appears well-developed and well-nourished. No distress.  HENT:  Head: Normocephalic and atraumatic.  Eyes: Pupils are equal, round, and reactive to light.  Neck: Normal range of motion.  Cardiovascular: Normal rate and intact distal pulses.   Pulmonary/Chest: He is in respiratory distress. He has wheezes.  Abdominal: Normal appearance. He exhibits no distension. There is no tenderness. There is no rebound.  Musculoskeletal: Normal range of motion.  Neurological: He is alert and oriented to person, place, and time. No cranial nerve deficit.  Skin: Skin is warm and dry. No rash noted.  Psychiatric: He has a normal mood and affect. His behavior is normal.  Nursing note and vitals reviewed.   ED Course  Procedures (including critical care time) Medications  heparin injection 5,000 Units (5,000 Units Subcutaneous Given 10/03/14 0508)  guaiFENesin (MUCINEX) 12 hr tablet 600 mg (600 mg Oral Given 10/02/14 1931)  fluticasone (FLONASE) 50 MCG/ACT nasal spray 2 spray (2 sprays Each Nare Given 10/02/14 2130)  polyethylene glycol (MIRALAX / GLYCOLAX) packet 17 g (not administered)  budesonide-formoterol (SYMBICORT) 160-4.5 MCG/ACT inhaler 2 puff (2 puffs Inhalation Given 10/03/14 0801)  albuterol (PROVENTIL) (2.5  MG/3ML) 0.083% nebulizer solution 2.5 mg (2.5 mg Nebulization Given 10/03/14 0801)  cyclobenzaprine (FLEXERIL) tablet 10 mg (10 mg Oral Given 10/02/14 2130)  HYDROcodone-acetaminophen (NORCO/VICODIN) 5-325 MG per tablet 1-2 tablet (2 tablets Oral Given 10/03/14 0131)  HYDROcodone-homatropine (HYCODAN) 5-1.5 MG/5ML syrup 5 mL (5 mLs Oral Given 10/03/14 0131)  famotidine (PEPCID) tablet 20 mg (20 mg Oral Given 10/02/14 2130)  pantoprazole (PROTONIX) EC tablet 40 mg (40 mg Oral Given 10/02/14 0853)  bisoprolol (ZEBETA) tablet 10 mg (10 mg Oral Given 10/02/14 0940)  bismuth subsalicylate (PEPTO BISMOL) 262 MG/15ML suspension 30 mL (30 mLs Oral Given 10/03/14 0131)  sodium chloride (OCEAN) 0.65 % nasal spray 1 spray (not administered)  diphenhydrAMINE (BENADRYL) capsule 25 mg (25 mg Oral Given 10/02/14 2130)  ibuprofen (ADVIL,MOTRIN) tablet 800 mg (800 mg Oral Given 10/02/14 2130)  predniSONE (  DELTASONE) tablet 40 mg (40 mg Oral Given 10/02/14 1355)  levofloxacin (LEVAQUIN) tablet 750 mg (750 mg Oral Given 10/02/14 1355)  albuterol (PROVENTIL) (2.5 MG/3ML) 0.083% nebulizer solution 5 mg (5 mg Nebulization Given 09/29/14 1337)  methylPREDNISolone sodium succinate (SOLU-MEDROL) 125 mg/2 mL injection 125 mg (125 mg Intravenous Given 09/29/14 1436)  cefTRIAXone (ROCEPHIN) 1 g in dextrose 5 % 50 mL IVPB (0 g Intravenous Stopped 09/29/14 1637)  azithromycin (ZITHROMAX) tablet 500 mg (500 mg Oral Given 09/29/14 1555)  magnesium sulfate IVPB 2 g 50 mL (2 g Intravenous Given 09/29/14 2200)  hydrALAZINE (APRESOLINE) injection 5 mg (5 mg Intravenous Given 09/30/14 0650)  Influenza vac split quadrivalent PF (FLUARIX) injection 0.5 mL (0.5 mLs Intramuscular Given 10/01/14 1138)  LORazepam (ATIVAN) injection 1 mg (1 mg Intravenous Given 09/30/14 0959)    Labs Review Labs Reviewed  CBC WITH DIFFERENTIAL/PLATELET - Abnormal; Notable for the following:    RBC 4.15 (*)    Hemoglobin 11.3 (*)    HCT 35.4 (*)    All other  components within normal limits  COMPREHENSIVE METABOLIC PANEL - Abnormal; Notable for the following:    Chloride 100 (*)    Glucose, Bld 127 (*)    All other components within normal limits  BASIC METABOLIC PANEL - Abnormal; Notable for the following:    Sodium 134 (*)    Chloride 98 (*)    Glucose, Bld 244 (*)    All other components within normal limits  CBC WITH DIFFERENTIAL/PLATELET - Abnormal; Notable for the following:    RBC 4.13 (*)    Hemoglobin 11.3 (*)    HCT 34.9 (*)    Lymphs Abs 0.5 (*)    All other components within normal limits  IGE - Abnormal; Notable for the following:    IgE (Immunoglobulin E), Serum 686 (*)    All other components within normal limits  URINE RAPID DRUG SCREEN, HOSP PERFORMED - Abnormal; Notable for the following:    Opiates POSITIVE (*)    Cocaine POSITIVE (*)    All other components within normal limits  BASIC METABOLIC PANEL - Abnormal; Notable for the following:    Chloride 98 (*)    Glucose, Bld 155 (*)    All other components within normal limits  CBC - Abnormal; Notable for the following:    WBC 16.3 (*)    Hemoglobin 11.7 (*)    HCT 35.9 (*)    All other components within normal limits  CBC - Abnormal; Notable for the following:    WBC 12.9 (*)    RBC 4.20 (*)    Hemoglobin 11.3 (*)    HCT 35.2 (*)    All other components within normal limits  BASIC METABOLIC PANEL - Abnormal; Notable for the following:    Glucose, Bld 122 (*)    All other components within normal limits  CULTURE, BLOOD (ROUTINE X 2)  CULTURE, BLOOD (ROUTINE X 2)  CULTURE, EXPECTORATED SPUTUM-ASSESSMENT  MRSA PCR SCREENING  LEGIONELLA ANTIGEN, URINE  STREP PNEUMONIAE URINARY ANTIGEN  ANGIOTENSIN CONVERTING ENZYME    Imaging Review Dg Chest 2 View  10/02/2014   CLINICAL DATA:  Pneumonia.  Cough and chest pain.  EXAM: CHEST  2 VIEW  COMPARISON:  09/30/2014.  FINDINGS: Heart size appears normal. There is no pleural effusion or edema identified. Chronic  changes of sarcoid is again re- demonstrated including bilateral areas of architectural distortion, scarring, nodularity and consolidation. No superimposed airspace opacities identified.  IMPRESSION: 1. No change  from previous exam and no specific findings to to account for the patient's acute symptoms.   Electronically Signed   By: Signa Kell M.D.   On: 10/02/2014 08:55   I have personally reviewed and evaluated these images and lab results as part of my medical decision-making.   EKG Interpretation   Date/Time:  Sunday September 29 2014 13:22:13 EDT Ventricular Rate:  123 PR Interval:  168 QRS Duration: 84 QT Interval:  312 QTC Calculation: 446 R Axis:   96 Text Interpretation:  Sinus tachycardia LAE, consider biatrial enlargement  Borderline right axis deviation ST elevation suggests acute pericarditis  Confirmed by Clarisse Rodriges  MD, Inman Fettig ([id number]) on 09/29/2014 1:54:42 PM     I discussed with pulmonary medicine who will come see the patient in the emergency room. MDM   Final diagnoses:  Sarcoidosis  Pneumonia        Nelva Nay, MD 10/03/14 [phone]

## 2014-09-29 NOTE — H&P (Signed)
PULMONARY / CRITICAL CARE MEDICINE   Name: [name] MRN: [id number] DOB: April 13, 1965    ADMISSION DATE:  09/29/2014  REFERRING MD :  [facility] ED  CHIEF COMPLAINT:  Chest pain, dyspnea  INITIAL PRESENTATION: 49 year old man with stage IV sarcoidosis and bronchiectasis/bullous disease. Admitted 09/29/14 with   STUDIES:  CXR 9/18 >> severe chronic lung dz and fibrosis, possible superimposed LLL infiltrate.   SIGNIFICANT EVENTS: Admitted 9/18   HISTORY OF PRESENT ILLNESS:  49 year old man, never smoker with a history of sarcoidosis, associated fibrotic and cavitary lung disease with bronchiectasis, prior pneumothorax on the left status post VATS. He also is a history of obstructive sleep apnea. He has history of frequent exacerbations. He presented to the [facility] emergency department 09/29/14 complaining of dyspnea and left-sided chest pain. He tells me that he felt well in middle of last week but that 2 days prior to this admission he began to have body aches, fever, worsening cough and left-sided chest/flank pain that was worse on inspiration. On the day prior to admission he noticed an increase in his sputum production and a change in color to dark yellowish-brown. He has had some continued left-sided chest pain as well as wheezing and increased cough. He denies any sick contacts, any changes in his medications, any other new exposures.   PAST MEDICAL HISTORY :   has a past medical history of Hypertension; Sarcoidosis; OSA on CPAP; Chronic respiratory failure; Bronchitis; Pneumonia; COPD (chronic obstructive pulmonary disease); and Neuromuscular disorder.  has past surgical history that includes Rotator cuff repair; arm surgery ; and Video assisted thoracoscopy (vats)/thorocotomy (Left, 07/17/2012). Prior to Admission medications   Medication Sig Start Date End Date Taking? Authorizing Provider  albuterol (PROVENTIL HFA;VENTOLIN HFA) 108 (90 BASE) MCG/ACT inhaler Inhale 2 puffs into the lungs  every 6 (six) hours as needed for wheezing or shortness of breath. 09/10/14  Yes Leslye Peer, MD  albuterol (PROVENTIL) (2.5 MG/3ML) 0.083% nebulizer solution Take 2.5 mg by nebulization every 4 (four) hours as needed for wheezing or shortness of breath (wheezing and shortness of breath).  06/08/12  Yes Historical Provider, MD  bisacodyl (BISACODYL) 5 MG EC tablet Take 1 tablet (5 mg total) by mouth daily as needed for moderate constipation. 01/16/14  Yes Simonne Martinet, NP  budesonide-formoterol [facility]) 160-4.5 MCG/ACT inhaler Inhale 2 puffs into the lungs 2 (two) times daily. 11/28/13  Yes Jeanella Craze, NP  calcium-vitamin D (OSCAL WITH D) 500-200 MG-UNIT per tablet Take 1 tablet by mouth every morning.   Yes Historical Provider, MD  cyclobenzaprine (FLEXERIL) 10 MG tablet Take 10 mg by mouth 3 (three) times daily as needed for muscle spasms (muscle spasms).    Yes Historical Provider, MD  diphenhydrAMINE (BENADRYL) 25 MG tablet Take 25 mg by mouth every 6 (six) hours as needed for allergies (allergies).    Yes Historical Provider, MD  famotidine (PEPCID) 20 MG tablet Take 1 tablet (20 mg total) by mouth 2 (two) times daily. 03/08/12  Yes Renae Fickle, MD  ferrous sulfate 325 (65 FE) MG tablet Take 1 tablet (325 mg total) by mouth 2 (two) times daily with a meal. 06/18/12  Yes Leroy Sea, MD  fluticasone (FLONASE) 50 MCG/ACT nasal spray Place 2 sprays into both nostrils 2 (two) times daily. Patient taking differently: Place 1 spray into both nostrils daily as needed for allergies.  01/28/14  Yes Jeanella Craze, NP  guaiFENesin (MUCINEX) 600 MG 12 hr tablet Take 1 tablet (600  mg total) by mouth 2 (two) times daily as needed (For congestion.). 06/19/14  Yes Leslye Peer, MD  levofloxacin (LEVAQUIN) 750 MG tablet Take 1 tablet (750 mg total) by mouth daily. 09/05/14  Yes Hollice Espy, MD  Multiple Vitamins-Minerals (MULTIVITAMIN WITH MINERALS) tablet Take 1 tablet by mouth every morning.     Yes Historical Provider, MD  ondansetron (ZOFRAN) 4 MG tablet Take 4 mg by mouth every 8 (eight) hours as needed for nausea or vomiting.  09/05/14  Yes Historical Provider, MD  polyethylene glycol (MIRALAX / GLYCOLAX) packet Take 17 g by mouth daily. Patient taking differently: Take 17 g by mouth daily as needed for mild constipation or moderate constipation.  01/16/14  Yes Simonne Martinet, NP  predniSONE (DELTASONE) 10 MG tablet 30 mg po daily x 4 days, then  po daily x 5 days, then  po daily x 527 days 09/05/14  Yes Hollice Espy, MD  bisoprolol (ZEBETA) 10 MG tablet Take 1 tablet (10 mg total) by mouth daily. Patient not taking: Reported on 09/29/2014 03/26/14   Nita Sells Mikhail, DO  diltiazem (CARDIZEM CD) 120 MG 24 hr capsule Take 1 capsule (120 mg total) by mouth daily. Patient not taking: Reported on 09/29/2014 01/28/14   Jeanella Craze, NP   Allergies  Allergen Reactions  . Clindamycin/Lincomycin Anaphylaxis and Swelling    Made face and tongue swell    FAMILY HISTORY:  indicated that his mother is alive. He indicated that his father is deceased. He indicated that his sister is alive. He indicated that all of his four brothers are alive.  SOCIAL HISTORY:  reports that he has never smoked. He has never used smokeless tobacco. He reports that he drinks alcohol. He reports that he does not use illicit drugs.  REVIEW OF SYSTEMS:  As discussed in the history of present illness  SUBJECTIVE:  He feels dyspneic, has inspiratory left-sided chest discomfort  VITAL SIGNS: Temp:  [100.8 F (38.2 C)] 100.8 F (38.2 C) (09/18 1312) Pulse Rate:  [64-123] 116 (09/18 1448) Resp:  [18-26] 22 (09/18 1448) BP: (117-120)/(80-87) 120/87 mmHg (09/18 1448) SpO2:  [95 %-100 %] 100 % (09/18 1448) HEMODYNAMICS:   VENTILATOR SETTINGS:   INTAKE / OUTPUT: No intake or output data in the 24 hours ending 09/29/14 1616  PHYSICAL EXAMINATION: General:  Ill-appearing man, cushingoid facies Neuro:   Awake alert oriented, nonfocal HEENT:  Some mild upper airway noise, OP clear, nasal congestion evident Cardiovascular:  Tachycardic, regular, no murmur, no peripheral edema Lungs:  pursed lip breathing but no distress Abdomen:  Somewhat distended and tympanitic, nontender, positive bowel sounds Musculoskeletal:  No deformities,  Skin:  Some lower extremity venous stasis changes but no rash  LABS:  CBC  Recent Labs Lab 09/29/14 1440  WBC 8.3  HGB 11.3*  HCT 35.4*  PLT 279   Coag's No results for input(s): APTT, INR in the last 168 hours. BMET  Recent Labs Lab 09/29/14 1440  NA 136  K 3.8  CL 100*  CO2 31  BUN 10  CREATININE 1.02  GLUCOSE 127*   Electrolytes  Recent Labs Lab 09/29/14 1440  CALCIUM 9.1   Sepsis Markers No results for input(s): LATICACIDVEN, PROCALCITON, O2SATVEN in the last 168 hours. ABG No results for input(s): PHART, PCO2ART, PO2ART in the last 168 hours. Liver Enzymes  Recent Labs Lab 09/29/14 1440  AST 29  ALT 23  ALKPHOS 85  BILITOT 0.4  ALBUMIN 3.7   Cardiac Enzymes  No results for input(s): TROPONINI, PROBNP in the last 168 hours. Glucose No results for input(s): GLUCAP in the last 168 hours.  Imaging Dg Chest 2 View  09/29/2014   CLINICAL DATA:  Left-sided chest pain.  History of sarcoidosis.  EXAM: CHEST  2 VIEW  COMPARISON:  09/04/2014  FINDINGS: The cardiac silhouette, mediastinal and hilar contours are stable. There is severe chronic lung disease with pulmonary fibrosis. Findings suspicious for superimposed infiltrate in the left lung. No pleural effusion.  IMPRESSION: Severe chronic lung disease with pulmonary fibrosis.  Suspect superimposed left lower lobe infiltrate.   Electronically Signed   By: Rudie Meyer M.D.   On: 09/29/2014 13:59     ASSESSMENT / PLAN:  PULMONARY A: Probable LLL healthcare associated pneumonia. Clinical history and chest x-ray are consistent. The only outlier is his normal white blood cell  count Pulmonary sarcoidosis with stage IV changes, bullous disease, traction bronchiectasis. He has been evaluated for transplant in the past but is not currently listed Chronic Asthma / COPD without evidence of exacerbation at this time Obstructive sleep apnea P:   He will be admitted for IV antibiotic therapy, start ceftazidime + vancomycin Pulmonary hygiene Continue home Symbicort Albuterol nebulized every 4 hours when necessary Add back Flonase I will defer steroids at this time since he is not actively wheezing but low threshold to start should he clinically change (he did receive Solu-Medrol 1 in the emergency department) I will repeat his CT scan of the chest without contrast to assess for any parenchymal changes compared with his last in January Check IgE, eosinophils, ACE level Continue home CPAP / AutoSet CPAP  CARDIOVASCULAR A: hypertension, not currently on his medications P:  He has been on bisoprolol and diltiazem in the past. Will not start these at this time but follow him and decide based on monitoring  RENAL A:  No acute issues P:    GASTROINTESTINAL A:  GERD P:   Pepcid ordered  HEMATOLOGIC A:  Anemia of chronic disease and iron deficiency anemia P:  Hold iron supplements for now as he has had problems with severe constipation on previous hospitalization  INFECTIOUS A:  LLL Healthcare associated pneumonia P:   BCx2 9/18 >>  Sputum 9/18 >>   Azithromycin 1 9/18 Ceftriaxone 1 9/18 Ceftazidime 9/18>>  Vancomycin 9/18>>   ENDOCRINE A:  At risk hyperglycemia   P:   Follow glucose on BMP, goal < 180  NEUROLOGIC A:  Chronic back pain P:   Home baclofen ordered Norco ordered Urine drug screen   FAMILY  - Updates: no family available 9/18  - Inter-disciplinary family meet or Palliative Care meeting due by:  9/25   Levy Pupa, MD, PhD 09/29/2014, 5:14 PM [facility] [phone] or if no answer [phone]

## 2014-09-29 NOTE — ED Notes (Signed)
Pt reports SOB with pain in his L chest with inspiration.  Pt with hx of sarcoidosis.  Pt also reports coughing up brownish green sputum.  Has had pneumo in his L lung in the past.

## 2014-09-29 NOTE — Progress Notes (Signed)
Set up CPAP by patient's bedside.  Automatic mode 5-20cm H20 via a large full face mask. Patient said he would place on himself when ready for bed.  Patient aware to ask RN to call Respiratory if he needs further assistance.

## 2014-09-29 NOTE — Progress Notes (Signed)
ANTIBIOTIC CONSULT NOTE - INITIAL  Pharmacy Consult for Vancomycin and renal adjustment of antibiotics Indication: HCAP  Allergies  Allergen Reactions  . Clindamycin/Lincomycin Anaphylaxis and Swelling    Made face and tongue swell    Patient Measurements:    Vital Signs: Temp: 100.8 F (38.2 C) (09/18 1312) Temp Source: Oral (09/18 1312) BP: 134/90 mmHg (09/18 1806) Pulse Rate: 109 (09/18 1806) Intake/Output from previous day:   Intake/Output from this shift:    Labs:  Recent Labs  09/29/14 1440  WBC 8.3  HGB 11.3*  PLT 279  CREATININE 1.02   CrCl cannot be calculated (Unknown ideal weight.). No results for input(s): VANCOTROUGH, VANCOPEAK, VANCORANDOM, GENTTROUGH, GENTPEAK, GENTRANDOM, TOBRATROUGH, TOBRAPEAK, TOBRARND, AMIKACINPEAK, AMIKACINTROU, AMIKACIN in the last 72 hours.   Microbiology: No results found for this or any previous visit (from the past 720 hour(s)).  Medical History: Past Medical History  Diagnosis Date  . Hypertension   . Sarcoidosis   . OSA on CPAP   . Chronic respiratory failure     home O@ 4L (01/28/14)  . Bronchitis   . Pneumonia   . COPD (chronic obstructive pulmonary disease)   . Neuromuscular disorder     Medications:  Anti-infectives    Start     Dose/Rate Route Frequency Ordered Stop   09/29/14 1530  cefTRIAXone (ROCEPHIN) 1 g in dextrose 5 % 50 mL IVPB     1 g 100 mL/hr over 30 Minutes Intravenous  Once 09/29/14 1521 09/29/14 1637   09/29/14 1530  azithromycin (ZITHROMAX) tablet 500 mg     500 mg Oral  Once 09/29/14 1521 09/29/14 1555     Assessment: 48yo M w/ chest pain and dyspnea. History of stage IV sarcoidosis and bronchiectasis/bullous disease. Elita Quick is ordered and pharmacy is asked to dose Vancomycin for probable LLL HCAP. Received Rocephin and azithromycin in the ED. Recent weight 83kg. SCr wnl, CrCl 37ml/min.  Goal of Therapy:  Vancomycin trough level 15-20 mcg/ml  Appropriate antibiotic dosing for renal  function; eradication of infection  Plan:  Vancomycin 1g IV q8h. Fortaz 2g IV q8h. Measure Vanc trough at steady state. Follow up renal fxn, culture results, and clinical course.  Charolotte Eke, PharmD, pager [phone]. 09/29/2014,7:50 PM.

## 2014-09-29 NOTE — ED Notes (Signed)
Patient transported to X-ray 

## 2014-09-29 NOTE — Progress Notes (Signed)
eLink Physician-Brief Progress Note Patient Name: [name] DOB: [birth year] MRN: [id number]   Date of Service  09/29/2014  HPI/Events of Note  Nursing reporting increase wheezing since arrival in icu  eICU Interventions  Try mgso4 which typically helps with both asthma and pseudoasthma/ solumedrol 80 q12 and max gerd rx     Intervention Category Major Interventions: Respiratory failure - evaluation and management  Sandrea Hughs 09/29/2014, 9:38 PM

## 2014-09-30 ENCOUNTER — Inpatient Hospital Stay (HOSPITAL_COMMUNITY): Payer: Commercial Managed Care - HMO

## 2014-09-30 DIAGNOSIS — J9621 Acute and chronic respiratory failure with hypoxia: Secondary | ICD-10-CM

## 2014-09-30 LAB — CBC WITH DIFFERENTIAL/PLATELET
BASOS ABS: 0 10*3/uL (ref 0.0–0.1)
BASOS PCT: 0 %
Eosinophils Absolute: 0 10*3/uL (ref 0.0–0.7)
Eosinophils Relative: 0 %
HEMATOCRIT: 34.9 % — AB (ref 39.0–52.0)
HEMOGLOBIN: 11.3 g/dL — AB (ref 13.0–17.0)
LYMPHS PCT: 9 %
Lymphs Abs: 0.5 10*3/uL — ABNORMAL LOW (ref 0.7–4.0)
MCH: 27.4 pg (ref 26.0–34.0)
MCHC: 32.4 g/dL (ref 30.0–36.0)
MCV: 84.5 fL (ref 78.0–100.0)
MONO ABS: 0.1 10*3/uL (ref 0.1–1.0)
Monocytes Relative: 1 %
NEUTROS ABS: 5.4 10*3/uL (ref 1.7–7.7)
NEUTROS PCT: 90 %
Platelets: 265 10*3/uL (ref 150–400)
RBC: 4.13 MIL/uL — AB (ref 4.22–5.81)
RDW: 12.8 % (ref 11.5–15.5)
WBC: 6 10*3/uL (ref 4.0–10.5)

## 2014-09-30 LAB — STREP PNEUMONIAE URINARY ANTIGEN: STREP PNEUMO URINARY ANTIGEN: NEGATIVE

## 2014-09-30 LAB — BASIC METABOLIC PANEL
ANION GAP: 11 (ref 5–15)
BUN: 12 mg/dL (ref 6–20)
CALCIUM: 9 mg/dL (ref 8.9–10.3)
CO2: 25 mmol/L (ref 22–32)
Chloride: 98 mmol/L — ABNORMAL LOW (ref 101–111)
Creatinine, Ser: 0.93 mg/dL (ref 0.61–1.24)
GLUCOSE: 244 mg/dL — AB (ref 65–99)
POTASSIUM: 4.2 mmol/L (ref 3.5–5.1)
Sodium: 134 mmol/L — ABNORMAL LOW (ref 135–145)

## 2014-09-30 LAB — LEGIONELLA ANTIGEN, URINE

## 2014-09-30 MED ORDER — LORAZEPAM 2 MG/ML IJ SOLN
1.0000 mg | Freq: Once | INTRAMUSCULAR | Status: AC
  Administered 2014-09-30: 1 mg via INTRAVENOUS
  Filled 2014-09-30: qty 1

## 2014-09-30 MED ORDER — BISOPROLOL FUMARATE 10 MG PO TABS
10.0000 mg | ORAL_TABLET | Freq: Every day | ORAL | Status: DC
  Administered 2014-09-30 – 2014-10-03 (×4): 10 mg via ORAL
  Filled 2014-09-30 (×2): qty 1
  Filled 2014-09-30: qty 2
  Filled 2014-09-30: qty 1

## 2014-09-30 MED ORDER — INFLUENZA VAC SPLIT QUAD 0.5 ML IM SUSY
0.5000 mL | PREFILLED_SYRINGE | INTRAMUSCULAR | Status: AC
  Administered 2014-10-01: 0.5 mL via INTRAMUSCULAR
  Filled 2014-09-30 (×2): qty 0.5

## 2014-09-30 MED ORDER — HYDRALAZINE HCL 20 MG/ML IJ SOLN
5.0000 mg | Freq: Once | INTRAMUSCULAR | Status: AC
  Administered 2014-09-30: 5 mg via INTRAVENOUS
  Filled 2014-09-30: qty 1

## 2014-09-30 NOTE — Care Management Note (Signed)
Case Management Note  Patient Details  Name: [name] MRN: [id number] Date of Birth: Aug 22, 1965  Subjective/Objective:        Sepsis versus pna            Action/Plan:Date:  Sept. 19, 2016 U.R. performed for needs and level of care. Will continue to follow for Case Management needs.  Marcelle Smiling, RN, BSN, [place]   [phone]   Expected Discharge Date:                  Expected Discharge Plan:  Home/Self Care  In-House Referral:  NA  Discharge planning Services  CM Consult  Post Acute Care Choice:  NA Choice offered to:  NA  DME Arranged:    DME Agency:     HH Arranged:    HH Agency:     Status of Service:  In process, will continue to follow  Medicare Important Message Given:    Date Medicare IM Given:    Medicare IM give by:    Date Additional Medicare IM Given:    Additional Medicare Important Message give by:     If discussed at Long Length of Stay Meetings, dates discussed:    Additional Comments:  Golda Acre, RN 09/30/2014, 11:36 AM

## 2014-09-30 NOTE — Progress Notes (Signed)
PULMONARY / CRITICAL CARE MEDICINE   Name: Eesa Justiss MRN: [id number] DOB: 02-Apr-1965    ADMISSION DATE:  09/29/2014  REFERRING MD :  [facility]  CHIEF COMPLAINT:  Chest pain, dyspnea  INITIAL PRESENTATION: 49 year old man with stage IV sarcoidosis and bronchiectasis/bullous disease. Admitted 09/29/14 with new LLL infiltrate concerning for PNA.    STUDIES:  CXR 9/18 >> severe chronic lung dz and fibrosis, possible superimposed LLL infiltrate.  CT Chest 9/19 >> extensive nodularity & mass like consolidation, L>R large chronic bulla in RUL unchanged  SIGNIFICANT EVENTS: 9/18  Admit with concern for LLL infiltrate / PNA    SUBJECTIVE:    VITAL SIGNS: Temp:  [98.4 F (36.9 C)-100.8 F (38.2 C)] 98.5 F (36.9 C) (09/19 0800) Pulse Rate:  [63-123] 103 (09/19 0800) Resp:  [14-31] 21 (09/19 0800) BP: (117-170)/(78-113) 154/105 mmHg (09/19 0800) SpO2:  [95 %-100 %] 100 % (09/19 0811) Weight:  [181 lb (82.1 kg)] 181 lb (82.1 kg) (09/18 2013)   HEMODYNAMICS:   VENTILATOR SETTINGS:     INTAKE / OUTPUT:  Intake/Output Summary (Last 24 hours) at 09/30/14 1105 Last data filed at 09/30/14 1103  Gross per 24 hour  Intake   2030 ml  Output    700 ml  Net   1330 ml    PHYSICAL EXAMINATION: General:  Ill-appearing man, cushingoid facies Neuro:  Awake alert oriented, nonfocal HEENT:  Some mild upper airway noise, OP clear, nasal congestion evident Cardiovascular:  Tachycardic, regular, no murmur, no peripheral edema Lungs:  pursed lip breathing but no distress Abdomen:  Somewhat distended and tympanitic, nontender, positive bowel sounds Musculoskeletal:  No deformities,  Skin:  Some lower extremity venous stasis changes but no rash  LABS:  CBC  Recent Labs Lab 09/29/14 1440 09/30/14 0435  WBC 8.3 6.0  HGB 11.3* 11.3*  HCT 35.4* 34.9*  PLT 279 265   BMET  Recent Labs Lab 09/29/14 1440 09/30/14 0435  NA 136 134*  K 3.8 4.2  CL 100* 98*  CO2 31 25  BUN 10 12   CREATININE 1.02 0.93  GLUCOSE 127* 244*   Electrolytes  Recent Labs Lab 09/29/14 1440 09/30/14 0435  CALCIUM 9.1 9.0   Liver Enzymes  Recent Labs Lab 09/29/14 1440  AST 29  ALT 23  ALKPHOS 85  BILITOT 0.4  ALBUMIN 3.7   Imaging Dg Chest 2 View  09/29/2014   CLINICAL DATA:  Left-sided chest pain.  History of sarcoidosis.  EXAM: CHEST  2 VIEW  COMPARISON:  09/04/2014  FINDINGS: The cardiac silhouette, mediastinal and hilar contours are stable. There is severe chronic lung disease with pulmonary fibrosis. Findings suspicious for superimposed infiltrate in the left lung. No pleural effusion.  IMPRESSION: Severe chronic lung disease with pulmonary fibrosis.  Suspect superimposed left lower lobe infiltrate.   Electronically Signed   By: Rudie Meyer M.D.   On: 09/29/2014 13:59   Ct Chest Wo Contrast  09/30/2014   CLINICAL DATA:  49 year old male with history of body aches and fever, with worsening productive cough and left-sided chest and flank pain which increases with inspiration. History of sarcoidosis. History of sleep apnea. Chronic respiratory failure.  EXAM: CT CHEST WITHOUT CONTRAST  TECHNIQUE: Multidetector CT imaging of the chest was performed following the standard protocol without IV contrast.  COMPARISON:  Chest CT 01/22/2014.  FINDINGS: Mediastinum/Lymph Nodes: Heart size is normal. Old There is no significant pericardial fluid, thickening or pericardial calcification. Innumerable borderline enlarged and mildly enlarged mediastinal and bilateral  hilar lymph nodes are noted, measuring up to 13 mm in short axis in the low right paratracheal nodal station, similar to prior study from 01/22/2014. Many of these lymph nodes are calcified. Esophagus is unremarkable in appearance. No axillary lymphadenopathy.  Lungs/Pleura: A large bulla is again noted in the right upper lobe, similar to the prior study. As with numerous prior examinations, there is extensive nodularity and mass-like  consolidation scattered throughout the lungs bilaterally. This is most evident in the perihilar regions of the lungs, particularly on the left, and the nodularity appears to extend both along the peribronchovascular interstitium, in the subpleural aspect of the lungs, and along the fissures, characteristic of nodularity associated with sarcoidosis. This is associated with extensive areas of architectural distortion, and there is some upward retraction of the hilar structures, as seen on prior examinations. Chronic elevation of the left hemidiaphragm. No acute consolidative airspace disease. No pleural effusions.  Upper Abdomen: Unremarkable.  Musculoskeletal/Soft Tissues: There are no aggressive appearing lytic or blastic lesions noted in the visualized portions of the skeleton.  IMPRESSION: 1. No acute findings in the thorax to account for the patient's symptoms. 2. Chronic stigmata of sarcoidosis redemonstrated, very similar to prior studies, as detailed above. 3. Large chronic bulla in the right upper lobe is unchanged.   Electronically Signed   By: Trudie Reed M.D.   On: 09/30/2014 09:43     ASSESSMENT / PLAN:  PULMONARY A:  Probable LLL healthcare associated pneumonia. Clinical history and chest x-ray are consistent. The only outlier is his normal white blood cell count Pulmonary sarcoidosis with stage IV changes, bullous disease, traction bronchiectasis. He has been evaluated for transplant in the past but is not currently listed Pleuritic Chest Discomfort - on L Chronic Asthma / COPD without evidence of exacerbation at this time Obstructive sleep apnea P:   Continue abx, see ID section Pulmonary hygiene Continue home Symbicort Albuterol Q4 PRN  Continue Flonase Solu-Medrol 80 mg BID  CT of the chest 9/19 reviewed, ? Worsening of nodular densities on L with consolidation Follow up IgE, ACE level >> Eosinophils 3 9/18 and 0 9/19 Continue home CPAP / AutoSet CPAP Flutter  valve  CARDIOVASCULAR A:  Hypertension, not currently on his medications.  Previously on bisoprolol & diltiazem P:  Resume Bisoprolol   RENAL A:   No acute issues P:   Trend BMP / UOP  Replace electrolytes as indictated  GASTROINTESTINAL A:   GERD P:   Pepcid   HEMATOLOGIC A:   Anemia of chronic disease and iron deficiency anemia P:  Hold iron supplements for now as he has had problems with severe constipation on previous hospitalization  INFECTIOUS A:   LLL Healthcare associated pneumonia P:   BCx2 9/18 >>  Sputum 9/18 >>   Azithromycin 1 9/18 Ceftriaxone 1 9/18 Ceftazidime 9/18>>  Vancomycin 9/18>>   ENDOCRINE A:   At risk hyperglycemia   P:   Follow glucose on BMP, goal < 180  NEUROLOGIC A:   Chronic back pain Anxiety  P:   Home baclofen ordered Norco ordered Urine drug screen Ativan  IV x1 9/19   FAMILY  - Updates: patient updated at bedside 9/19  - Inter-disciplinary family meet or Palliative Care meeting due by:  9/25  Canary Brim, NP-C [place] [place] [facility] Pgr: [phone] or if no answer [phone] 09/30/2014, 11:05 AM

## 2014-09-30 NOTE — Progress Notes (Signed)
CPAP set up for Pt at bedside.  Pt places on/off himself.  Machine set at automode 5-10 cm H2O.  Humidifier filled with sterile water.

## 2014-09-30 NOTE — Progress Notes (Signed)
Pt transferred to 1330 via bed. Report given to [facility], Charity fundraiser. All questions answered.

## 2014-09-30 NOTE — Progress Notes (Signed)
eLink Physician-Brief Progress Note Patient Name: Tivon Lemoine DOB: 12-22-1965 MRN: [id number]   Date of Service  09/30/2014  HPI/Events of Note  MAP 130 0per RN and HR 92. Asymptomatci  eICU Interventions  Hydralazine  x 1     Intervention Category Intermediate Interventions: Hypertension - evaluation and management  RAMASWAMY,MURALI 09/30/2014, 6:42 AM

## 2014-09-30 NOTE — Progress Notes (Signed)
eLink Physician-Brief Progress Note Patient Name: [name] DOB: May 22, 1965 MRN: [id number]   Date of Service  09/30/2014  HPI/Events of Note  RN ssays SBP 169 wit dbp 110 and MAP 124. Patient slee[ping. HR 100. Trend shows HR improving but SBP just went up. He is sitting on chair and sleeping. Per RN asymptomatic. No hx of bP at home  Currentlyon high solumedr0l and also 30cc/h of normal saline  eICU Interventions  Dc saline infusion Saline lock Monitor - Rx for MAP > 130 or symptoms     Intervention Category Intermediate Interventions: Hypertension - evaluation and management  RAMASWAMY,MURALI 09/30/2014, 12:48 AM

## 2014-10-01 DIAGNOSIS — I10 Essential (primary) hypertension: Secondary | ICD-10-CM

## 2014-10-01 LAB — CBC
HCT: 35.9 % — ABNORMAL LOW (ref 39.0–52.0)
Hemoglobin: 11.7 g/dL — ABNORMAL LOW (ref 13.0–17.0)
MCH: 27.5 pg (ref 26.0–34.0)
MCHC: 32.6 g/dL (ref 30.0–36.0)
MCV: 84.3 fL (ref 78.0–100.0)
PLATELETS: 324 10*3/uL (ref 150–400)
RBC: 4.26 MIL/uL (ref 4.22–5.81)
RDW: 12.7 % (ref 11.5–15.5)
WBC: 16.3 10*3/uL — ABNORMAL HIGH (ref 4.0–10.5)

## 2014-10-01 LAB — IGE: IgE (Immunoglobulin E), Serum: 686 IU/mL — ABNORMAL HIGH (ref 0–100)

## 2014-10-01 LAB — RAPID URINE DRUG SCREEN, HOSP PERFORMED
AMPHETAMINES: NOT DETECTED
BARBITURATES: NOT DETECTED
Benzodiazepines: NOT DETECTED
Cocaine: POSITIVE — AB
OPIATES: POSITIVE — AB
TETRAHYDROCANNABINOL: NOT DETECTED

## 2014-10-01 LAB — BASIC METABOLIC PANEL
Anion gap: 10 (ref 5–15)
BUN: 16 mg/dL (ref 6–20)
CALCIUM: 9.3 mg/dL (ref 8.9–10.3)
CO2: 27 mmol/L (ref 22–32)
CREATININE: 0.81 mg/dL (ref 0.61–1.24)
Chloride: 98 mmol/L — ABNORMAL LOW (ref 101–111)
GFR calc non Af Amer: >60 mL/min (ref >60)
Glucose, Bld: 155 mg/dL — ABNORMAL HIGH (ref 65–99)
Potassium: 4.6 mmol/L (ref 3.5–5.1)
Sodium: 135 mmol/L (ref 135–145)

## 2014-10-01 LAB — ANGIOTENSIN CONVERTING ENZYME: Angiotensin-Converting Enzyme: 58 U/L (ref 14–82)

## 2014-10-01 NOTE — Progress Notes (Signed)
PULMONARY / CRITICAL CARE MEDICINE   Name: [name] MRN: [id number] DOB: May 06, 1965    ADMISSION DATE:  09/29/2014  REFERRING MD :  [facility] ED  CHIEF COMPLAINT:  Chest pain, dyspnea  INITIAL PRESENTATION: 49 year old man with stage IV sarcoidosis and bronchiectasis/bullous disease. Admitted 09/29/14 with new LLL infiltrate concerning for PNA.    STUDIES:  CXR 9/18 >> severe chronic lung dz and fibrosis, possible superimposed LLL infiltrate.  CT Chest 9/19 >> extensive nodularity & mass like consolidation, L>R large chronic bulla in RUL unchanged  SIGNIFICANT EVENTS: 9/18  Admit with concern for LLL infiltrate / PNA    SUBJECTIVE:  Pt reports feeling better but not back to baseline.  Denies significant sputum, chest pain, SOB   VITAL SIGNS: Temp:  [97.4 F (36.3 C)-97.5 F (36.4 C)] 97.5 F (36.4 C) (09/20 0510) Pulse Rate:  [91-103] 98 (09/20 0510) Resp:  [18-22] 22 (09/20 0510) BP: (130-157)/(90-96) 130/90 mmHg (09/20 0510) SpO2:  [98 %-100 %] 98 % (09/20 0604)   INTAKE / OUTPUT:  Intake/Output Summary (Last 24 hours) at 10/01/14 1428 Last data filed at 10/01/14 1019  Gross per 24 hour  Intake    100 ml  Output   1000 ml  Net   -900 ml    PHYSICAL EXAMINATION: General:  Ill-appearing man, cushingoid face Neuro:  Awake alert oriented, nonfocal HEENT:  OP clear, nasal congestion evident Cardiovascular:  Tachycardic, regular, no murmur, no peripheral edema Lungs:  pursed lip breathing but no distress, lungs bilaterally distant but clear Abdomen:  Somewhat distended and tympanitic, nontender, positive bowel sounds Musculoskeletal:  No deformities,  Skin:  Some lower extremity venous stasis changes but no rash  LABS:  CBC  Recent Labs Lab 09/29/14 1440 09/30/14 0435 10/01/14 0415  WBC 8.3 6.0 16.3*  HGB 11.3* 11.3* 11.7*  HCT 35.4* 34.9* 35.9*  PLT 279 265 324   BMET  Recent Labs Lab 09/29/14 1440 09/30/14 0435 10/01/14 0415  NA 136 134* 135  K  3.8 4.2 4.6  CL 100* 98* 98*  CO2 BUN CREATININE 1.02 0.93 0.81  GLUCOSE 127* 244* 155*   Electrolytes  Recent Labs Lab 09/29/14 1440 09/30/14 0435 10/01/14 0415  CALCIUM 9.1 9.0 9.3   Liver Enzymes  Recent Labs Lab 09/29/14 1440  AST 29  ALT 23  ALKPHOS 85  BILITOT 0.4  ALBUMIN 3.7   Imaging No results found.   ASSESSMENT / PLAN:  PULMONARY A:  Probable LLL healthcare associated pneumonia. Clinical history and chest x-ray are consistent. The only outlier is his normal white blood cell count Pulmonary sarcoidosis with stage IV changes, bullous disease, traction bronchiectasis. He has been evaluated for transplant in the past but is not currently listed Pleuritic Chest Discomfort - on L Chronic Asthma / COPD without evidence of exacerbation at this time Obstructive sleep apnea P:   Continue abx, see ID section Pulmonary hygiene Continue home Symbicort Albuterol Q4 PRN  Continue Flonase Solu-Medrol 80 mg BID  CT of the chest 9/19 reviewed, ? Worsening of nodular densities on L with consolidation IgE 686, ACE level 58  Eosinophils 3 9/18 and 0 9/19 Continue home CPAP / AutoSet CPAP Flutter valve Add chest vest Drug abuse counseling  CARDIOVASCULAR A:  Hypertension, not currently on his medications.  Previously on bisoprolol & diltiazem P:  Continue Bisoprolol, BP trend improved  RENAL A:   No acute issues P:   Trend BMP / UOP  Replace electrolytes as indictated  GASTROINTESTINAL A:   GERD P:   Pepcid   HEMATOLOGIC A:   Anemia of chronic disease and iron deficiency anemia P:  Hold iron supplements for now as he has had problems with severe constipation on previous hospitalization  INFECTIOUS A:   LLL Healthcare associated pneumonia P:   BCx2 9/18 >>  Sputum 9/18 >> not lower specimen  Azithromycin 1 9/18 Ceftriaxone 1 9/18 Ceftazidime 9/18>>  Vancomycin 9/18>>   ENDOCRINE A:   At risk hyperglycemia   P:    Follow glucose on BMP, goal < 180  NEUROLOGIC A:   Chronic back pain Anxiety  P:   Home baclofen ordered Norco ordered Urine drug screen >>>POSITIVE for cocaine!  Will discuss with patient 9/21 Ativan  IV x1 9/19   FAMILY  - Updates: patient updated at bedside 9/19   Canary Brim, NP-C [place] [facility] Pgr: [phone] or if no answer [phone] 10/01/2014, 2:28 PM

## 2014-10-01 NOTE — Progress Notes (Addendum)
Offered pt assistance with cpap, but he declined.  Pt stated he places it on/off himself.  Machine set on 10cm h2o per home settings with ramp starting at 5cm per his request.  Pt prefers to keep his [place] underneath cpap at night.  Sterile water added to humidifier.  Pt encouraged to call RT should he need further assistance.

## 2014-10-01 NOTE — Progress Notes (Signed)
Pt lives home alone. Pt wears 02 at home. CM following for DC needs. Sandford Craze RN,BSN,NCM [phone]

## 2014-10-02 ENCOUNTER — Inpatient Hospital Stay (HOSPITAL_COMMUNITY): Payer: Commercial Managed Care - HMO

## 2014-10-02 LAB — CBC
HCT: 35.2 % — ABNORMAL LOW (ref 39.0–52.0)
HEMOGLOBIN: 11.3 g/dL — AB (ref 13.0–17.0)
MCH: 26.9 pg (ref 26.0–34.0)
MCHC: 32.1 g/dL (ref 30.0–36.0)
MCV: 83.8 fL (ref 78.0–100.0)
PLATELETS: 312 10*3/uL (ref 150–400)
RBC: 4.2 MIL/uL — AB (ref 4.22–5.81)
RDW: 12.6 % (ref 11.5–15.5)
WBC: 12.9 10*3/uL — AB (ref 4.0–10.5)

## 2014-10-02 LAB — BASIC METABOLIC PANEL
ANION GAP: 7 (ref 5–15)
BUN: 17 mg/dL (ref 6–20)
CHLORIDE: 102 mmol/L (ref 101–111)
CO2: 29 mmol/L (ref 22–32)
Calcium: 9.1 mg/dL (ref 8.9–10.3)
Creatinine, Ser: 0.73 mg/dL (ref 0.61–1.24)
GFR calc Af Amer: >60 mL/min (ref >60)
Glucose, Bld: 122 mg/dL — ABNORMAL HIGH (ref 65–99)
POTASSIUM: 4.3 mmol/L (ref 3.5–5.1)
SODIUM: 138 mmol/L (ref 135–145)

## 2014-10-02 MED ORDER — IBUPROFEN 800 MG PO TABS
800.0000 mg | ORAL_TABLET | Freq: Three times a day (TID) | ORAL | Status: DC | PRN
  Administered 2014-10-02 (×2): 800 mg via ORAL
  Filled 2014-10-02 (×2): qty 1

## 2014-10-02 MED ORDER — PREDNISONE 20 MG PO TABS
40.0000 mg | ORAL_TABLET | Freq: Every day | ORAL | Status: DC
  Administered 2014-10-02 – 2014-10-03 (×2): 40 mg via ORAL
  Filled 2014-10-02 (×2): qty 2

## 2014-10-02 MED ORDER — SALINE SPRAY 0.65 % NA SOLN
1.0000 | NASAL | Status: DC | PRN
  Filled 2014-10-02: qty 44

## 2014-10-02 MED ORDER — LEVOFLOXACIN 750 MG PO TABS
750.0000 mg | ORAL_TABLET | Freq: Every day | ORAL | Status: DC
  Administered 2014-10-02 – 2014-10-03 (×2): 750 mg via ORAL
  Filled 2014-10-02 (×2): qty 1

## 2014-10-02 MED ORDER — BISMUTH SUBSALICYLATE 262 MG/15ML PO SUSP
30.0000 mL | Freq: Four times a day (QID) | ORAL | Status: DC | PRN
  Administered 2014-10-02 – 2014-10-03 (×2): 30 mL via ORAL
  Filled 2014-10-02: qty 118

## 2014-10-02 MED ORDER — DIPHENHYDRAMINE HCL 25 MG PO CAPS
25.0000 mg | ORAL_CAPSULE | Freq: Four times a day (QID) | ORAL | Status: DC | PRN
  Administered 2014-10-02 – 2014-10-03 (×3): 25 mg via ORAL
  Filled 2014-10-02 (×3): qty 1

## 2014-10-02 NOTE — Progress Notes (Signed)
PULMONARY / CRITICAL CARE MEDICINE   Name: [name] MRN: [id number] DOB: 07/25/1965    ADMISSION DATE:  09/29/2014  REFERRING MD :  [facility] ED  CHIEF COMPLAINT:  Chest pain, dyspnea  INITIAL PRESENTATION: 49 year old man with stage IV sarcoidosis and bronchiectasis/bullous disease. Admitted 09/29/14 with new LLL infiltrate concerning for PNA.    STUDIES:  CXR 9/18 >> severe chronic lung dz and fibrosis, possible superimposed LLL infiltrate.  CT Chest 9/19 >> extensive nodularity & mass like consolidation, L>R large chronic bulla in RUL unchanged  SIGNIFICANT EVENTS: 9/18  Admit with concern for LLL infiltrate / PNA    SUBJECTIVE:  Pt reports feeling better, almost to baseline.  Questioning timing of discharge.  Denies significant sputum production.  C/o left sided lower posterior pain with inspiration - Vicodin did not help.  Also, reports indigestion and nasal dryness   VITAL SIGNS: Temp:  [97.8 F (36.6 C)-98 F (36.7 C)] 97.9 F (36.6 C) (09/21 0513) Pulse Rate:  [91-98] 91 (09/21 0513) Resp:  [22-23] 22 (09/21 0513) BP: (141-150)/(90-103) 141/90 mmHg (09/21 0513) SpO2:  [98 %-100 %] 99 % (09/21 0841)   INTAKE / OUTPUT:  Intake/Output Summary (Last 24 hours) at 10/02/14 1212 Last data filed at 10/02/14 0513  Gross per 24 hour  Intake    900 ml  Output    800 ml  Net    100 ml    PHYSICAL EXAMINATION: General:  Ill-appearing man, cushingoid face Neuro:  Awake alert oriented, nonfocal HEENT:  OP clear, nasal congestion evident Cardiovascular:  Tachycardic, regular, no murmur, no peripheral edema Lungs:  pursed lip breathing but no distress, lungs bilaterally distant but clear Abdomen:  Somewhat distended and tympanitic, nontender, positive bowel sounds Musculoskeletal:  No deformities, MAE Skin:  Some lower extremity venous stasis changes but no rash  LABS:  CBC  Recent Labs Lab 09/30/14 0435 10/01/14 0415 10/02/14 0413  WBC 6.0 16.3* 12.9*  HGB 11.3*  11.7* 11.3*  HCT 34.9* 35.9* 35.2*  PLT 265 324 312   BMET  Recent Labs Lab 09/30/14 0435 10/01/14 0415 10/02/14 0413  NA 134* 135 138  K 4.2 4.6 4.3  CL 98* 98* 102  CO2 BUN CREATININE 0.93 0.81 0.73  GLUCOSE 244* 155* 122*   Electrolytes  Recent Labs Lab 09/30/14 0435 10/01/14 0415 10/02/14 0413  CALCIUM 9.0 9.3 9.1   Liver Enzymes  Recent Labs Lab 09/29/14 1440  AST 29  ALT 23  ALKPHOS 85  BILITOT 0.4  ALBUMIN 3.7   Imaging Dg Chest 2 View  10/02/2014   CLINICAL DATA:  Pneumonia.  Cough and chest pain.  EXAM: CHEST  2 VIEW  COMPARISON:  09/30/2014.  FINDINGS: Heart size appears normal. There is no pleural effusion or edema identified. Chronic changes of sarcoid is again re- demonstrated including bilateral areas of architectural distortion, scarring, nodularity and consolidation. No superimposed airspace opacities identified.  IMPRESSION: 1. No change from previous exam and no specific findings to to account for the patient's acute symptoms.   Electronically Signed   By: Signa Kell M.D.   On: 10/02/2014 08:55     ASSESSMENT / PLAN:  PULMONARY A:  Probable LLL healthcare associated pneumonia. Clinical history and chest x-ray are consistent. The only outlier is his normal white blood cell count Pulmonary sarcoidosis with stage IV changes, bullous disease, traction bronchiectasis. He has been evaluated for transplant in the past but is not currently listed Pleuritic  Chest Discomfort - on L Chronic Asthma / COPD without evidence of exacerbation at this time Obstructive sleep apnea Pleuritic Chest Pain  P:   Continue abx, see ID section Pulmonary hygiene Continue home Symbicort Albuterol Q4 PRN  Continue Flonase, NS saline Transition steroids to PO, 40 mg QD CT of the chest 9/19 reviewed, Worsening of nodular densities on L with consolidation IgE 686, ACE level 58  Eosinophils 3 9/18 and 0 9/19 Continue home CPAP / AutoSet  CPAP Flutter valve Chest vest Drug abuse counseling completed 9/21 Ibuprofen Q8 PRN for pleuritic pain  CARDIOVASCULAR A:  Hypertension, not currently on his medications.  Previously on bisoprolol & diltiazem P:  Continue Bisoprolol, BP trend improved  RENAL A:   No acute issues P:   Trend BMP / UOP  Replace electrolytes as indictated  GASTROINTESTINAL A:   GERD P:   Pepcid   HEMATOLOGIC A:   Anemia of chronic disease and iron deficiency anemia P:  Hold iron supplements for now as he has had problems with severe constipation on previous hospitalization  INFECTIOUS A:   LLL Healthcare associated pneumonia P:   BCx2 9/18 >>  Sputum 9/18 >> not lower specimen  Azithromycin 1 9/18 Ceftriaxone 1 9/18 Ceftazidime 9/18 >> 9/21 Vancomycin 9/18 >> 9/21 Levaquin 9/21 >>    ENDOCRINE A:   At risk hyperglycemia   P:   Follow glucose on BMP, goal < 180  NEUROLOGIC A:   Chronic back pain Anxiety  P:   Home baclofen ordered Norco ordered Urine drug screen >>>POSITIVE for cocaine!  Discussed this with patient 9/21 in relation to his lung disease   FAMILY  - Updates: patient updated at bedside 9/21.  Transition to oral medications.  Plan for potential discharge in am 9/22 if patient tolerates changes.    Canary Brim, NP-C [place] [facility] Pgr: [phone] or if no answer [phone] 10/02/2014, 12:12 PM

## 2014-10-02 NOTE — Progress Notes (Signed)
spole with patient about Cpap.  Pt states he will administer when he gets ready for bed.  He states he wears it at home and does not require any help with it in the hospital.  Advised patient if runs into any issues to  Have RN call me.

## 2014-10-02 NOTE — Progress Notes (Signed)
Paged Canary Brim NP concerning left side pain and Heartburn. Pt is experiencing pain with inhalation. States the vicodin does not work well enough.

## 2014-10-03 ENCOUNTER — Encounter: Payer: Self-pay | Admitting: Pulmonary Disease

## 2014-10-03 MED ORDER — LEVOFLOXACIN 750 MG PO TABS: 750.0000 mg | ORAL_TABLET | Freq: Every day | ORAL | Status: DC

## 2014-10-03 MED ORDER — IBUPROFEN 800 MG PO TABS: 800.0000 mg | ORAL_TABLET | Freq: Three times a day (TID) | ORAL | Status: DC | PRN

## 2014-10-03 MED ORDER — PREDNISONE 10 MG PO TABS: ORAL_TABLET | ORAL | Status: DC

## 2014-10-03 MED ORDER — OXYCODONE-ACETAMINOPHEN 7.5-325 MG PO TABS: 1.0000 | ORAL_TABLET | Freq: Four times a day (QID) | ORAL | Status: DC | PRN

## 2014-10-03 NOTE — Progress Notes (Signed)
PHARMACY NOTE -  Levaquin  Pharmacy has been assisting with dosing of Levaquin  for HCAP. Dosage remains stable at  PO daily and need for further dosage adjustment appears unlikely at present. Will sign off at this time.  Please reconsult if a change in clinical status warrants re-evaluation of dosage.  Hessie Knows, PharmD, BCPS Pager [phone] 10/03/2014 8:43 AM

## 2014-10-03 NOTE — Care Management Note (Signed)
Case Management Note  Patient Details  Name: Erdem Naas MRN: [id number] Date of Birth: [birth year]  Subjective/Objective:        49 yo admitted with HCAP, Hx of sarcoidosis            Action/Plan: From home, with home 02  Expected Discharge Date:                  Expected Discharge Plan:  Home/Self Care  In-House Referral:  NA  Discharge planning Services  CM Consult  Post Acute Care Choice:  NA Choice offered to:  NA  DME Arranged:  Vest percussion DME Agency:   ([facility])  HH Arranged:    HH Agency:     Status of Service:  Completed, signed off  Medicare Important Message Given:    Date Medicare IM Given:    Medicare IM give by:    Date Additional Medicare IM Given:    Additional Medicare Important Message give by:     If discussed at Long Length of Stay Meetings, dates discussed:    Additional Comments: Order for percussion vest for home. Pt uses [facility] for his home 02. I called [employer] who referred me to [facility] for the percussion vest. Order form, demographics and notes faxed to [facility] [phone]. Pt with portable 02 tank for DC. No other dc needs noted. Bartholome Bill, RN 10/03/2014, 3:53 PM

## 2014-10-03 NOTE — Discharge Summary (Signed)
Physician Discharge Summary  Patient ID: [name] MRN: [id number] DOB/AGE: Dec 13, 1965 49 y.o.  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 Lamonte Sakai as scheduled  Out of school note until 9/26, may return for full activity  PRN ibuprofen for pleuritic pain - pt instructed to utilize this first  Percocet for severe pain  PRN albuterol  Arranged for chest vest (filled out request form and faxed to [employer]) Continue flonase  Consider CXR at time of follow up to ensure resolution of LLL infiltrate  Hypertension   Discharge Plan: Continue home medications - bisoprolol, cardizem  Follow up with PCP as needed   GERD   Discharge Plan: Continue pepcid   Anemia of Chronic Disease / IDA   Discharge Plan: Continue iron as previously prescribed   Chronic Back Pain  Anxiety  Cocaine Abuse  Discharge Plan: Discussed cessation of cocaine use with patient in relation to his lung disease and need to stop.                     DISCHARGE SUMMARY   [name] is a 49 y.o. y/o male, never smoker, with a PMH of stage IV sarcoidosis associated fibrotic and cavitary lung disease with bronchiectasis, prior pneumothorax on the left status post VATS,obstructive sleep apnea. He has history of frequent exacerbations. The patient presented to the  [facility] emergency department 09/29/14 complaining of dyspnea and left-sided chest pain.  Admitted 09/29/14 with new LLL infiltrate concerning for PNA.   The patient reported he felt well until 2 days prior to presentation when he developed body aches, fever, worsening cough and left-sided chest/flank pain that was worse on inspiration. On the day prior to admission he noticed an increase in his sputum production and a change in color to dark yellowish-brown. He has had some continued left-sided chest pain as well as wheezing and increased cough.   The patient was treated with IV antibiotics, steroids, nebulized bronchodilators, chest vest (difficulty clearing secretions), oxygen and nebulized bronchodilators. The patient made slow improvement.  Antibiotics / steroids were transitioned to oral administration which he tolerated.  Of note, UDS was positive for cocaine.  This was discussed with patient in relation to his lung disease.  Antihypertensive regimen was continued during hospitalization with good control.   He was medically cleared 9/22 for discharge with plans as above.         SIGNIFICANT DIAGNOSTIC STUDIES CXR 9/18 >> severe chronic lung dz and fibrosis,  possible superimposed LLL infiltrate.  CT Chest 9/19 >> extensive nodularity & mass like consolidation, L>R large chronic bulla in RUL unchanged UDS 9/20 >> positive for cocaine  SIGNIFICANT EVENTS: 9/18 Admit with concern for LLL infiltrate / PNA  MICRO DATA  BCx2 9/18 >>   ANTIBIOTICS Ceftazidime 9/18 >> 9/21 Vancomycin 9/18 >> 9/21  Levaquin 9/22 >> to complete 10 days total abx    Discharge Exam: General: Ill-appearing man, cushingoid face Neuro: Awake alert oriented, nonfocal HEENT: OP clear, nasal congestion evident Cardiovascular: Tachycardic, regular, no murmur, no peripheral edema Lungs: pursed lip breathing but no distress, lungs bilaterally distant but clear Abdomen: Somewhat distended and tympanitic,  nontender, positive bowel sounds Musculoskeletal: No deformities, MAE Skin: Some lower extremity venous stasis changes but no rash   Filed Vitals:   10/03/14 0513 10/03/14 0801 10/03/14 1238 10/03/14 1338  BP: 137/90   139/89  Pulse: 81   89  Temp: 98.1 F (36.7 C)   97.8 F (36.6 C)  TempSrc: Oral   Oral  Resp: 20   20  Height:      Weight:      SpO2: 99% 99% 97% 100%     Discharge Labs  BMET  Recent Labs Lab 09/29/14 1440 09/30/14 0435 10/01/14 0415 10/02/14 0413  NA 136 134* 135 138  K 3.8 4.2 4.6 4.3  CL 100* 98* 98* 102  CO2 31 25 27 29   GLUCOSE 127* 244* 155* 122*  BUN 10 12 16 17   CREATININE 1.02 0.93 0.81 0.73  CALCIUM 9.1 9.0 9.3 9.1   CBC  Recent Labs Lab 09/30/14 0435 10/01/14 0415 10/02/14 0413  HGB 11.3* 11.7* 11.3*  HCT 34.9* 35.9* 35.2*  WBC 6.0 16.3* 12.9*  PLT 265 324 312    Discharge Instructions    Call MD for:  difficulty breathing, headache or visual disturbances    Complete by:  As directed      Call MD for:  extreme fatigue    Complete by:  As directed      Call MD for:  persistant dizziness or light-headedness    Complete by:  As directed      Call MD for:  persistant nausea and vomiting    Complete by:  As directed      Call MD for:  redness, tenderness, or signs of infection (pain, swelling, redness, odor or green/yellow discharge around incision site)    Complete by:  As directed      Call MD for:  severe uncontrolled pain    Complete by:  As directed      Call MD for:  temperature >100.4    Complete by:  As directed      Diet - low sodium heart healthy    Complete by:  As directed      Diet - low sodium heart healthy    Complete by:  As directed      Discharge instructions    Complete by:  As directed   1.  Review your medications carefully  2.  Complete your medications as prescribed 3.  Follow up as arranged with Dr. Lamonte Sakai and your PCP     Increase activity slowly    Complete by:  As directed      Increase  activity slowly    Complete by:  As directed                 Follow-up Information    Follow up with BYRUM,ROBERT S.,  MD On 10/11/2014.   Specialty:  Pulmonary Disease   Why:  Appt at 3:00 PM    Contact information:   [address] [address] [phone]       Schedule an appointment as soon as possible for a visit with Monia Sabal, MD.   Specialty:  Internal Medicine   Why:  As needed to follow up on blood pressure / primary care needs   Contact information:   [place] [address] [phone]          Medication List    TAKE these medications        albuterol (2.5 MG/3ML) 0.083% nebulizer solution  Commonly known as:  PROVENTIL  Take 2.5 mg by nebulization every 4 (four) hours as needed for wheezing or shortness of breath (wheezing and shortness of breath).     albuterol 108 (90 BASE) MCG/ACT inhaler  Commonly known as:  PROVENTIL HFA;VENTOLIN HFA  Inhale 2 puffs into the lungs every 6 (six) hours as needed for wheezing or shortness of breath.     bisacodyl 5 MG EC tablet  Commonly known as:  bisacodyl  Take 1 tablet (5 mg total) by mouth daily as needed for moderate constipation.     bisoprolol 10 MG tablet  Commonly known as:  ZEBETA  Take 1 tablet (10 mg total) by mouth daily.     budesonide-formoterol 160-4.5 MCG/ACT inhaler  Commonly known as:  SYMBICORT  Inhale 2 puffs into the lungs 2 (two) times daily.     calcium-vitamin D 500-200 MG-UNIT per tablet  Commonly known as:  OSCAL WITH D  Take 1 tablet by mouth every morning.     cyclobenzaprine 10 MG tablet  Commonly known as:  FLEXERIL  Take 10 mg by mouth 3 (three) times daily as needed for muscle spasms (muscle spasms).     diltiazem 120 MG 24 hr capsule  Commonly known as:  CARDIZEM CD  Take 1 capsule (120 mg total) by mouth daily.     diphenhydrAMINE 25 MG tablet  Commonly known as:  BENADRYL  Take 25 mg by mouth every 6 (six)  hours as needed for allergies (allergies).     famotidine 20 MG tablet  Commonly known as:  PEPCID  Take 1 tablet (20 mg total) by mouth 2 (two) times daily.     ferrous sulfate 325 (65 FE) MG tablet  Take 1 tablet (325 mg total) by mouth 2 (two) times daily with a meal.     fluticasone 50 MCG/ACT nasal spray  Commonly known as:  FLONASE  Place 2 sprays into both nostrils 2 (two) times daily.     guaiFENesin 600 MG 12 hr tablet  Commonly known as:  MUCINEX  Take 1 tablet (600 mg total) by mouth 2 (two) times daily as needed (For congestion.).     ibuprofen 800 MG tablet  Commonly known as:  ADVIL,MOTRIN  Take 1 tablet (800 mg total) by mouth every 8 (eight) hours as needed for moderate pain.     levofloxacin 750 MG tablet  Commonly known as:  LEVAQUIN  Take 1 tablet (750 mg total) by mouth daily.     multivitamin with minerals tablet  Take 1 tablet by mouth every morning.     ondansetron 4 MG tablet  Commonly known as:  ZOFRAN  Take 4 mg by mouth every 8 (eight) hours as needed for nausea or vomiting.     oxyCODONE-acetaminophen  7.5-325 MG per tablet  Commonly known as:  PERCOCET  Take 1 tablet by mouth every 6 (six) hours as needed for severe pain.     polyethylene glycol packet  Commonly known as:  MIRALAX / GLYCOLAX  Take 17 g by mouth daily.     predniSONE 10 MG tablet  Commonly known as:  DELTASONE  30 mg po daily x 4 days, then 47m po daily x 4 days, then 175mpo daily x 4 days          Disposition: Home.  Attempt to arrange for home vibra-vest.    Discharged Condition: ToDaquarius Dubeauas met maximum benefit of inpatient care and is medically stable and cleared for discharge.  Patient is pending follow up as above.      Time spent on disposition:  Greater than 35 minutes.   Signed: BrNoe GensNP-C [facility] Pgr: [phone] Office: [phone]

## 2014-10-04 LAB — CULTURE, BLOOD (ROUTINE X 2)
CULTURE: NO GROWTH
Culture: NO GROWTH

## 2014-10-11 ENCOUNTER — Encounter: Payer: Self-pay | Admitting: *Deleted

## 2014-10-11 ENCOUNTER — Encounter: Payer: Self-pay | Admitting: Emergency Medicine

## 2014-10-11 ENCOUNTER — Ambulatory Visit (INDEPENDENT_AMBULATORY_CARE_PROVIDER_SITE_OTHER): Payer: Commercial Managed Care - HMO | Admitting: Emergency Medicine

## 2014-10-11 VITALS — BP 122/74 | HR 101 | Ht 67.5 in | Wt 184.8 lb

## 2014-10-11 DIAGNOSIS — G4733 Obstructive sleep apnea (adult) (pediatric): Secondary | ICD-10-CM | POA: Diagnosis not present

## 2014-10-11 DIAGNOSIS — Z23 Encounter for immunization: Secondary | ICD-10-CM

## 2014-10-11 DIAGNOSIS — Z9989 Dependence on other enabling machines and devices: Principal | ICD-10-CM

## 2014-10-11 DIAGNOSIS — J9611 Chronic respiratory failure with hypoxia: Secondary | ICD-10-CM

## 2014-10-11 DIAGNOSIS — D869 Sarcoidosis, unspecified: Secondary | ICD-10-CM

## 2014-10-11 NOTE — Patient Instructions (Signed)
We will write you a school excuse note so you can return to school on Tuesday 10/15/14 Please continue Symbicort twice a day. We'll give samples if available Please continue albuterol nebulizer as you have been taking it Continue to use your flutter valve and your chest vest for mucus clearance Prevnar 13 today Follow with Dr Delton Coombes in 2 months or sooner if you have any problems.

## 2014-10-11 NOTE — Addendum Note (Signed)
Addended by: Tommie Sams on: 10/11/2014 03:47 PM   Modules accepted: Orders

## 2014-10-11 NOTE — Progress Notes (Signed)
Subjective:    Patient ID: [name], male    DOB: 20-May-1965, 49 y.o.   MRN: [id number]  HPI 49 yo male never smoker with sarcoidosis and bronchiectasis and chronic respiratory failure with chronic hypoxemia needing oxygen and sleep apnea needing CPAP  ROV 10/12/13 -- hx of sarcoidosis, bronchiectasis and R cavitary disease, OSA back on CPAP. Was seen by TP in May and then by [facility] in July as above. He has been treated for AE on multiple occasions since our last visit (more on than off). Returns for f/u.   ROV / Hospital f/u 12/11/13 -- hx of sarcoidosis, bronchiectasis and R cavitary disease, OSA back on CPAP. Currently on symbicort + albuterol averages 2-3x a day. Exacerbates frequently. He is having bloating that causes restriction and limits his breathing. Has several BM a day, sometimes diarrhea  Post Hospital follow up 05/23/14 --  Pt returns for a post hospital follow up  Admitted 4/28-5/2 for AECOPD  tx w/ levaquin and steroids  Since discharge still coughing and sinus drainage  Some better. But has ear pressure, sinus stuffiness, .  Discharged on predinsone   daily -last dose yesterday.  On Symbicort , admits to missing some.  Pharmacy called not filling symbicort- last filled 11/2013  Tongue feels thick . No dysphagia, visual or speech changes. No arm weakness.   Wears CPAP each night for 6hr .  Admits to missing nights   No rash , chest pain, orthopnea or fever.  On 3 l/m O2.   ROV 06/17/14 -- follow-up visit for chronic respiratory failure in the setting of sarcoidosis, bronchiectasis and obstructive sleep apnea. He has ups and downs with his exertional tolerance. He has thick mucous, some difficulty clearing secretions.  He is off prednisone. Taking mucinex bid. He uses flutter some days. Has used [name] more freq, mutliple times every day. Able to walk up stairs without stopping.   ROV 10/11/14 -- patient follows up for chronic respiratory failure, obstructive and  restrictive disease with bronchiectasis due to sarcoidosis. He also has a history of sleep apnea. He frequently exacerbates and was just admitted to the hospital. At the time of discharge he was sent out with a chest vest to assist with mucus clearance. He is currently on Symbicort, albuterol when necessary. He is using the chest vest 3x a day and seems to be benefiting. He has finished levaquin. He is using albuterol about 3x a day, in association with his mucous clearance. His exertional tolerance is better than at the time of his hospitalization.  Needs Prevnar-13  Review of Systems As per HPI     Objective:   Physical Exam  Filed Vitals:   10/11/14 1507  BP: 122/74  Pulse: 101  Height: 5' 7.5" (1.715 m)  Weight: 184 lb 12.8 oz (83.825 kg)  SpO2: 100%    GEN: A/Ox3; pleasant , NAD chronically ill appearing   HEENT:  OP clear, no erythema  NECK:  No stridor  RESP  Decreased BS in bases , no accessory muscle use, no dullness to percussion  CARD:  RRR, no m/r/g  , no peripheral edema, pulses intact, no cyanosis or clubbing.  Musco: Warm bil, no deformities or joint swelling noted.   Neuro: alert, no focal deficits noted.    Skin: Warm, no lesions or rashes     Assessment & Plan:  OSA on CPAP Continue CPAP every night  Bronchiectasis without acute exacerbation Recent exacerbation and hospitalization that improved after treatment with standard  therapy and Levaquin. I underscored the importance with him today of avoiding any recreational drugs or inhalation. He will continue Symbicort, chest vest, flutter valve. Follow-up in 2 months  Chronic respiratory failure with hypoxia Continue current oxygen at all times  Sarcoidosis With significant  fibrocavitary disease. We will discuss timing of any repeat imaging at his next visit

## 2014-10-11 NOTE — Assessment & Plan Note (Signed)
Continue current oxygen at all times

## 2014-10-11 NOTE — Assessment & Plan Note (Signed)
Continue CPAP every night. 

## 2014-10-11 NOTE — Assessment & Plan Note (Addendum)
With significant  fibrocavitary disease. We will discuss timing of any repeat imaging at his next visit

## 2014-10-11 NOTE — Assessment & Plan Note (Signed)
Recent exacerbation and hospitalization that improved after treatment with standard therapy and Levaquin. I underscored the importance with him today of avoiding any recreational drugs or inhalation. He will continue Symbicort, chest vest, flutter valve. Follow-up in 2 months

## 2014-11-02 IMAGING — CR DG CHEST 1V
1 series · 2 of 2 positions shown · non-contrast
Comparison: 05/15/2013

CLINICAL DATA: Chest pain, shortness of breath, wheezing, cough.
History of sarcoidosis.

EXAM:
CHEST - 1 VIEW

[Series 1: AP · U · 2 of 2 slices shown]
[im 1/2]
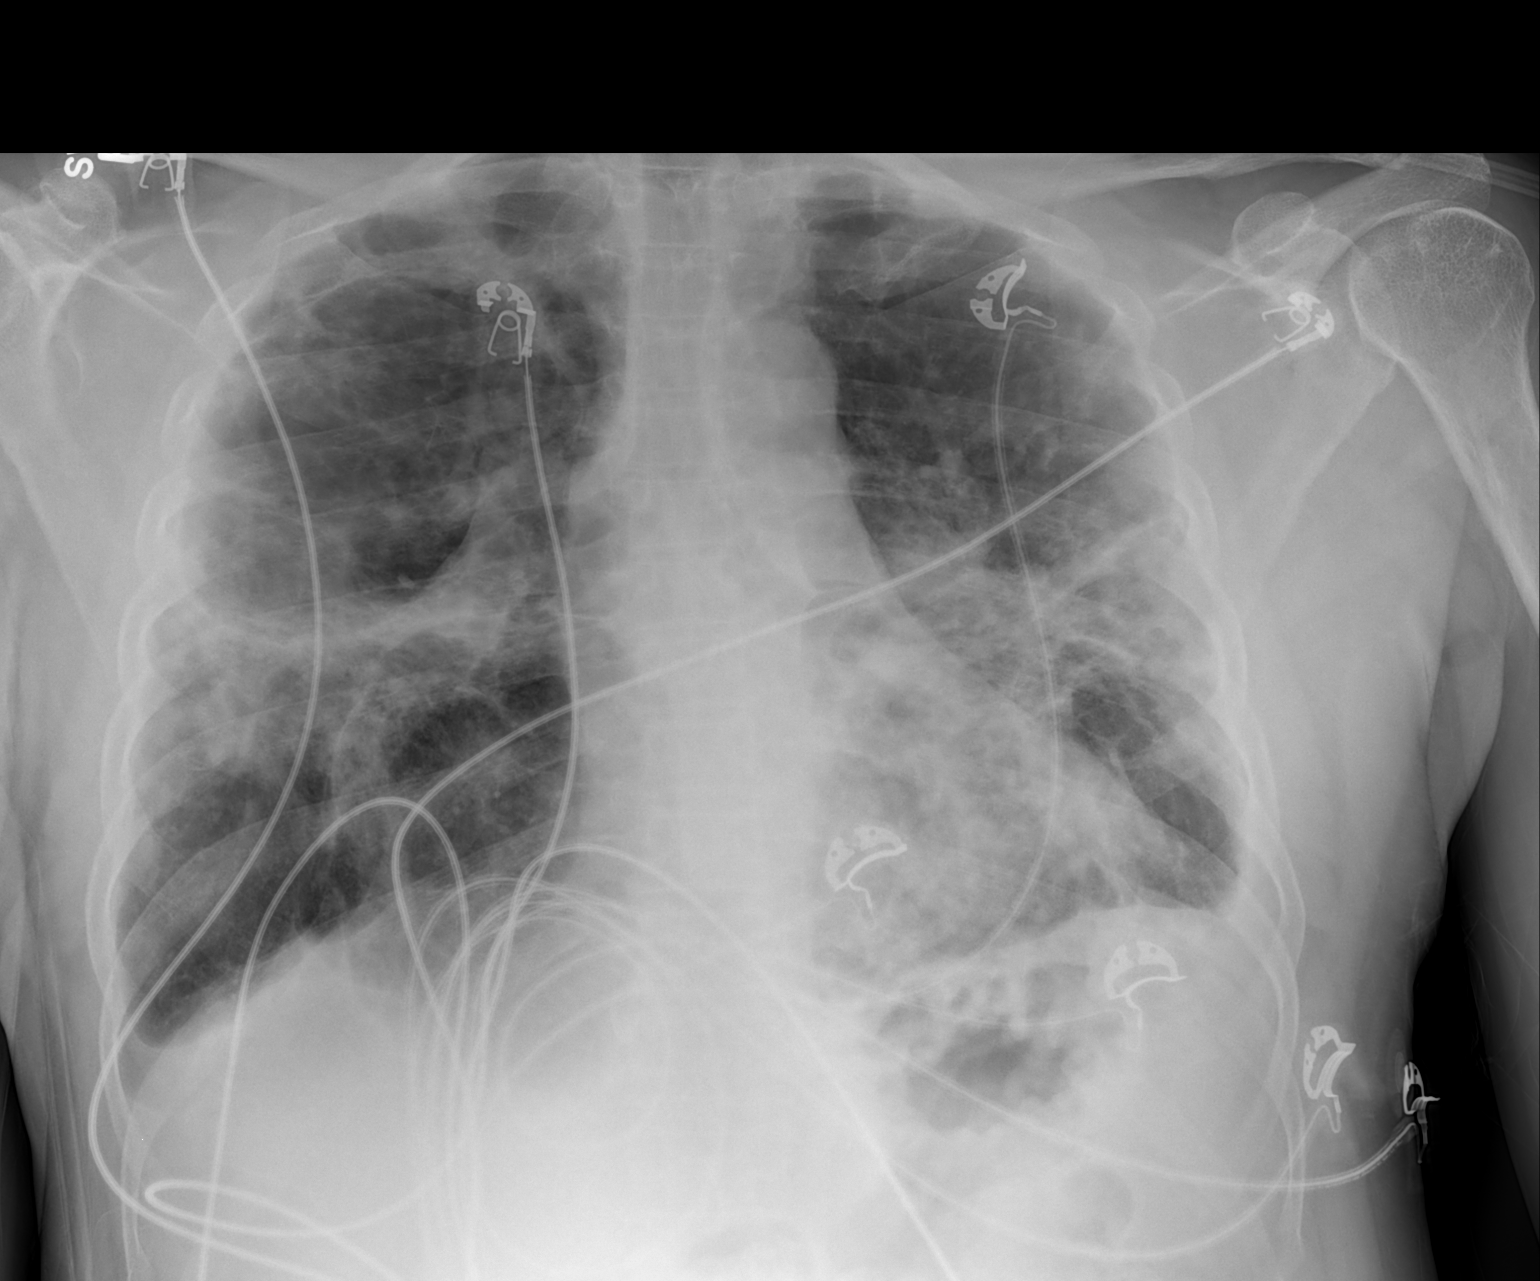
[im 2/2]
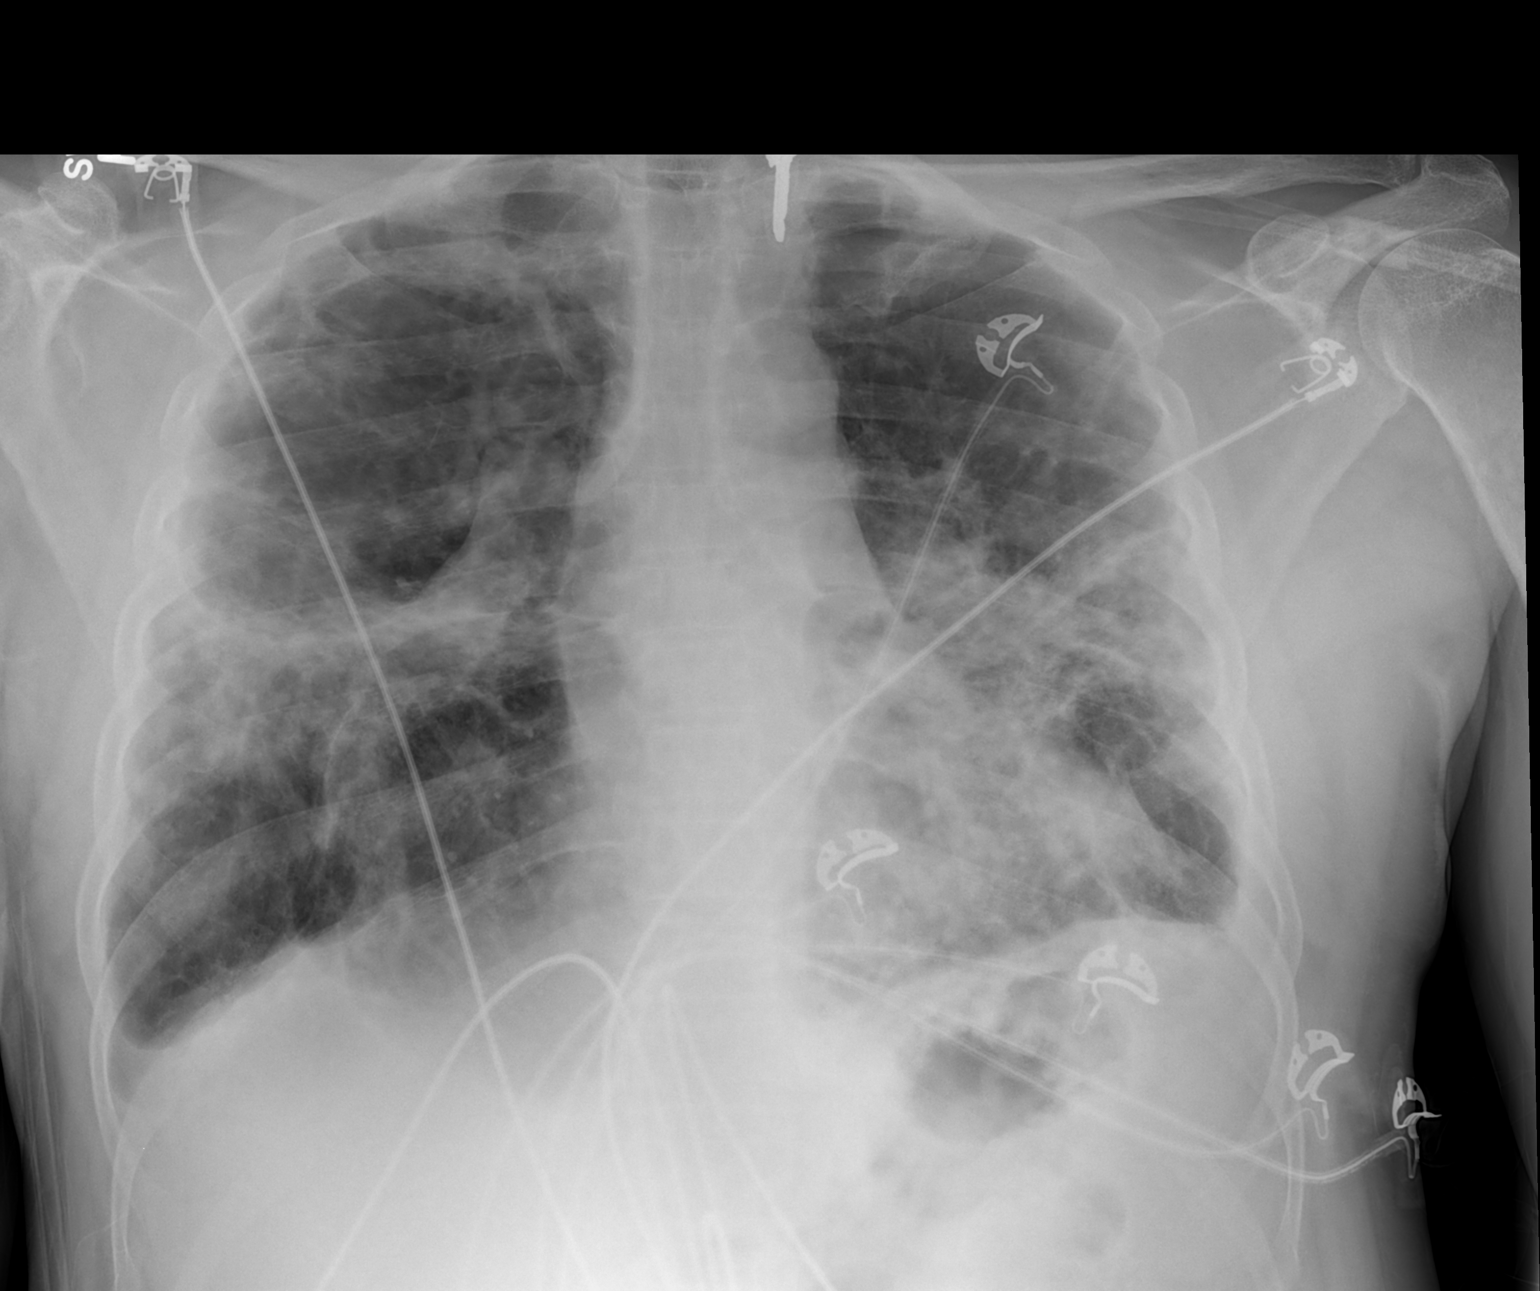

[2 of 2 positions shown; findings below may reference images not displayed]

FINDINGS: Extensive chronic changes throughout the lungs, stable since prior
study. No definite acute opacity. Blunting of the costophrenic
angles bilaterally could reflect small bilateral effusions. Heart is
normal size.
IMPRESSION: Stable severe chronic changes within the lungs compatible with
patient's given history of sarcoidosis.

Question small bilateral effusions.

## 2014-12-11 ENCOUNTER — Ambulatory Visit: Payer: Commercial Managed Care - HMO | Admitting: Emergency Medicine

## 2014-12-30 ENCOUNTER — Ambulatory Visit: Payer: Commercial Managed Care - HMO | Admitting: Adult Health

## 2014-12-30 IMAGING — CT CT CHEST W/O CM
2 of 3 series · 15 of 36 positions shown, 18 images · non-contrast
Comparison: 07/30/2012.

CLINICAL DATA: Bronchiectasis without complication
([id number]) SARCOID, BRONCHIECTASIS.

EXAM:
CT CHEST WITHOUT CONTRAST
TECHNIQUE: Multidetector CT imaging of the chest was performed following the
standard protocol without IV contrast..

[Series 2: chest routine with · axial · 0.71mm/px · z∈[+1214,+1464]mm · 12 of 60 slices shown, 15 images]
[im 5/60  mediastinal]
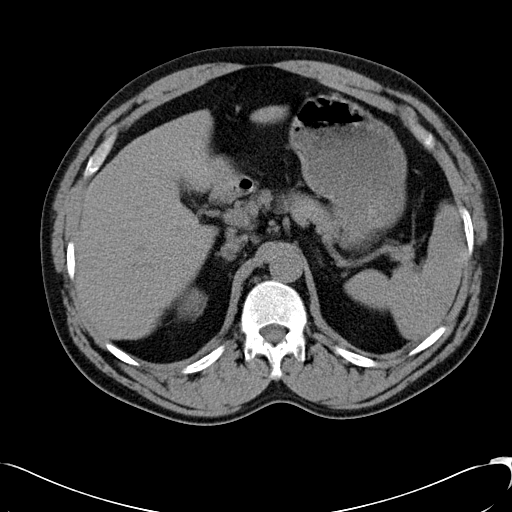
[im 5/60  lung]
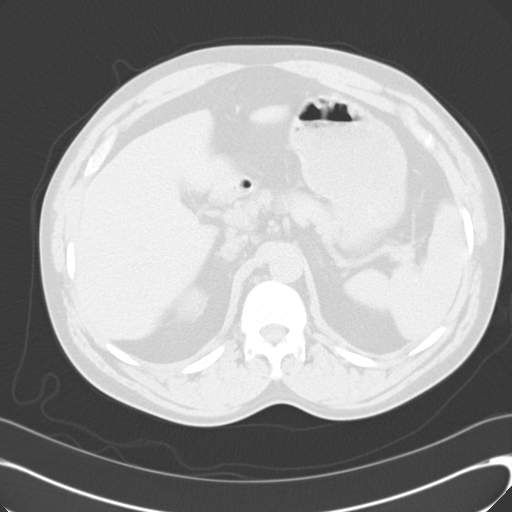
[im 9/60  lung]
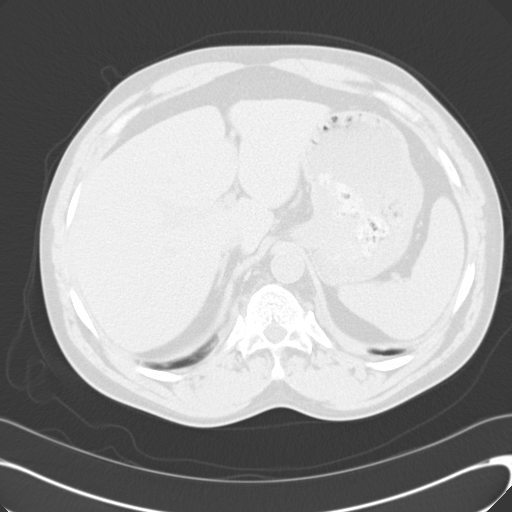
[im 14/60  lung]
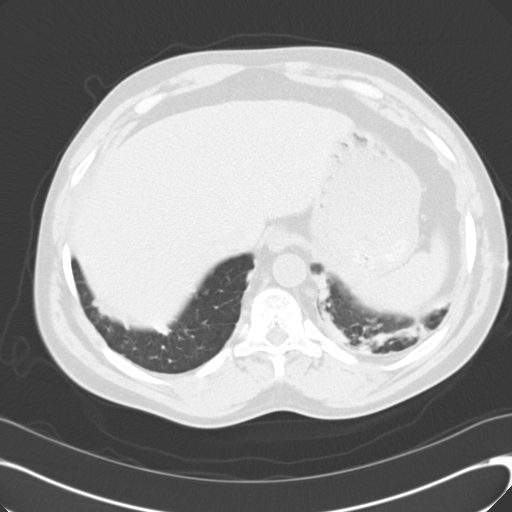
[im 18/60  lung]
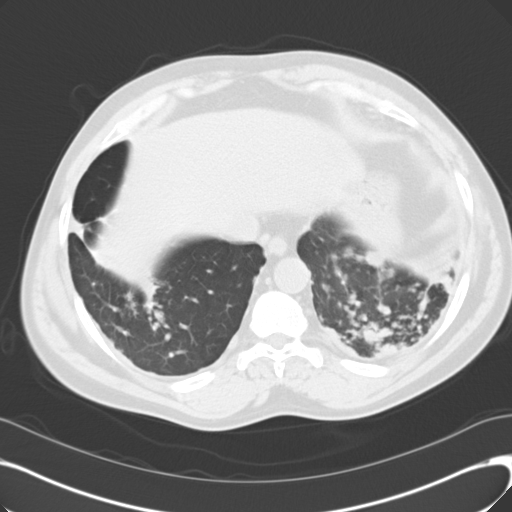
[im 22/60  mediastinal]
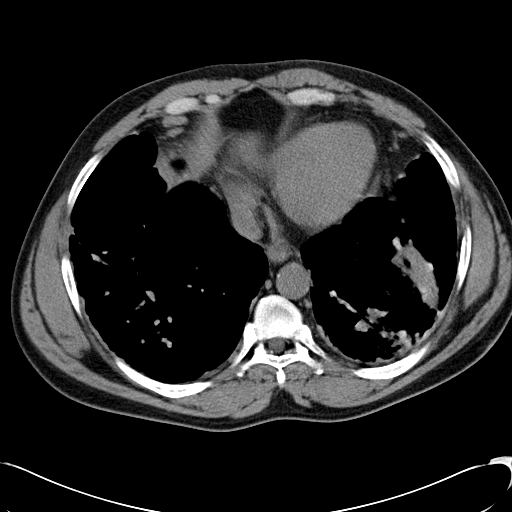
[im 22/60  lung]
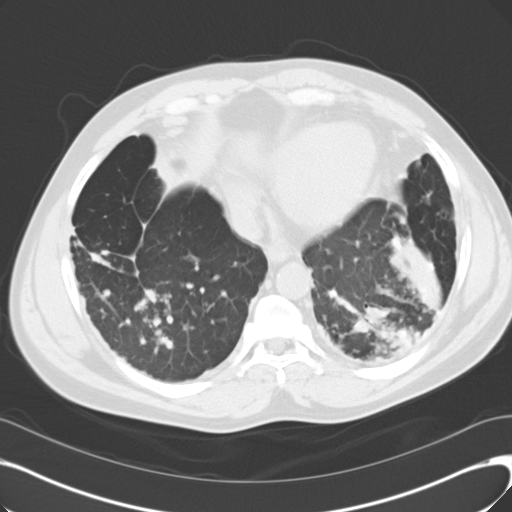
[im 27/60  lung]
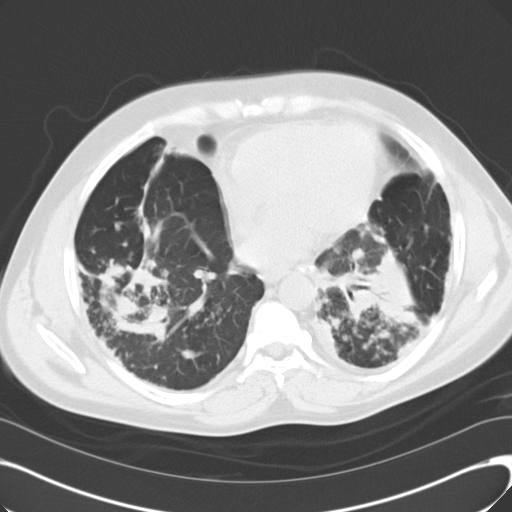
[im 33/60  lung]
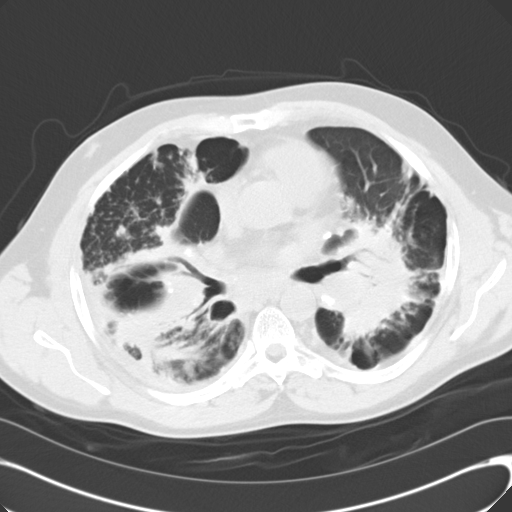
[im 38/60  lung]
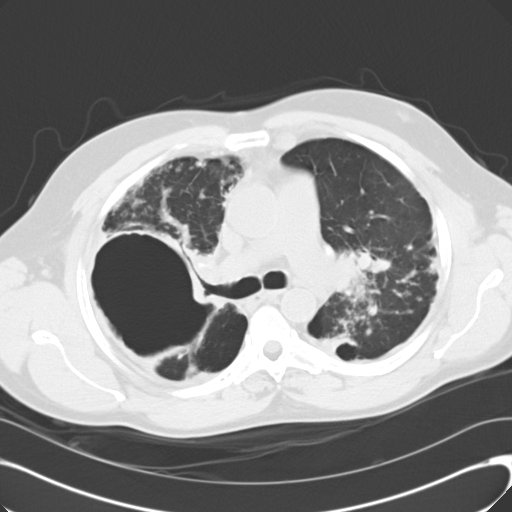
[im 42/60  mediastinal]
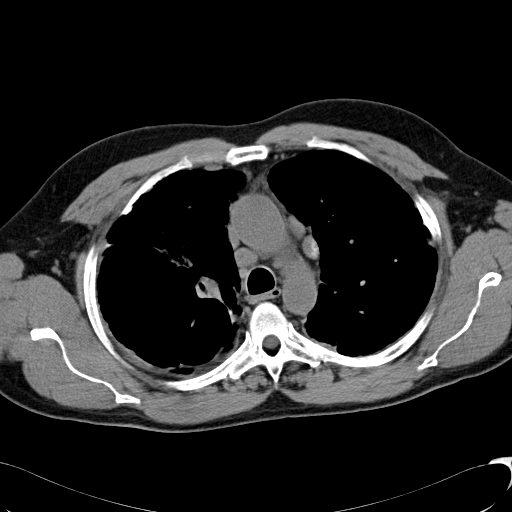
[im 42/60  lung]
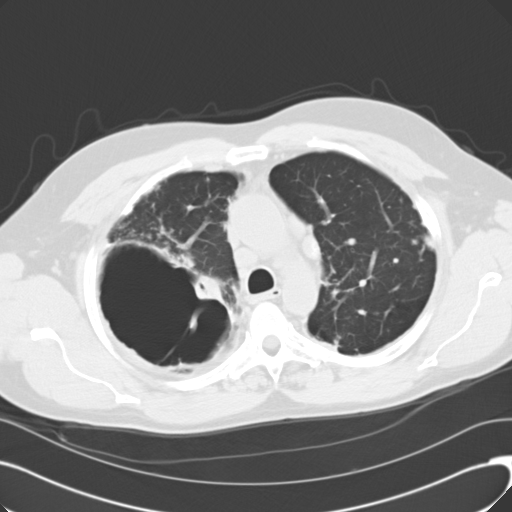
[im 46/60  lung]
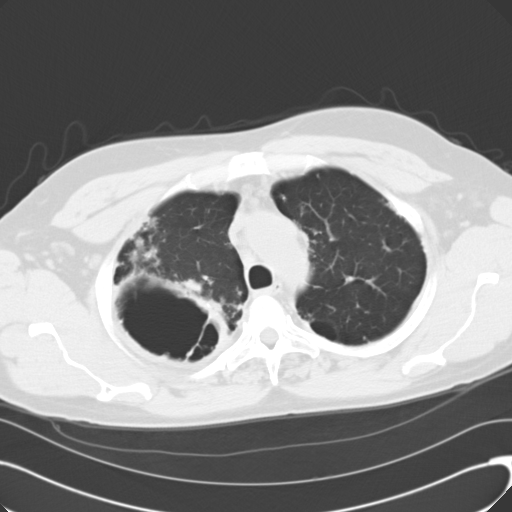
[im 51/60  lung]
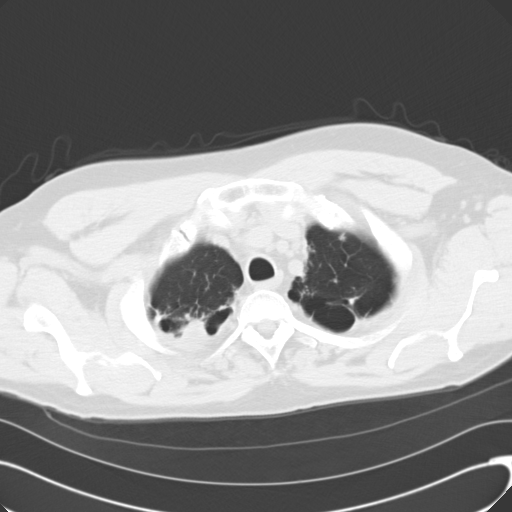
[im 55/60  lung]
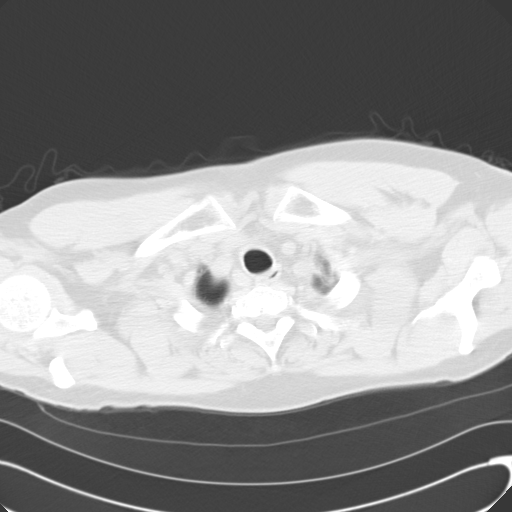

[Series 602: <mpr thick range> · coronal · 0.71mm/px · 3 of 131 slices shown]
[im 27/131  lung]
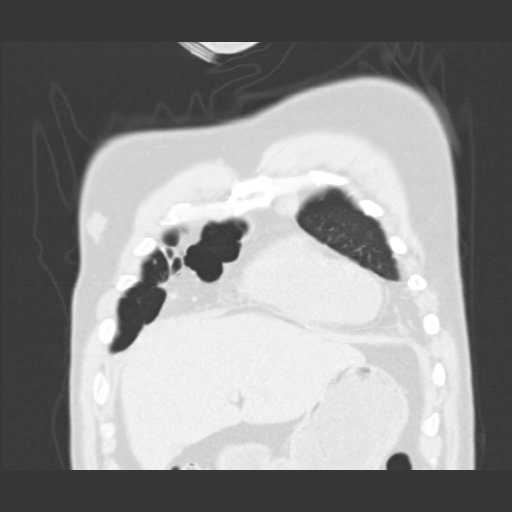
[im 53/131  lung]
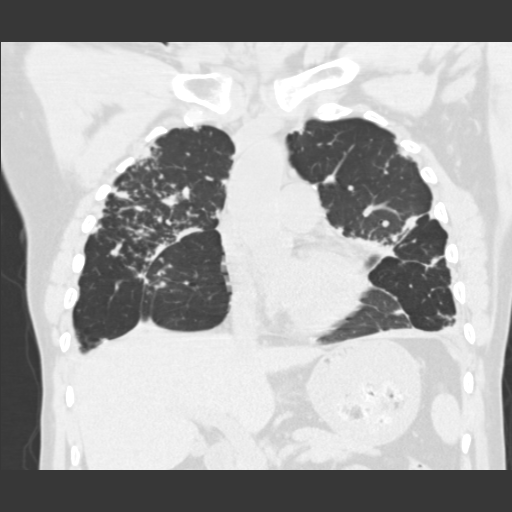
[im 79/131  lung]
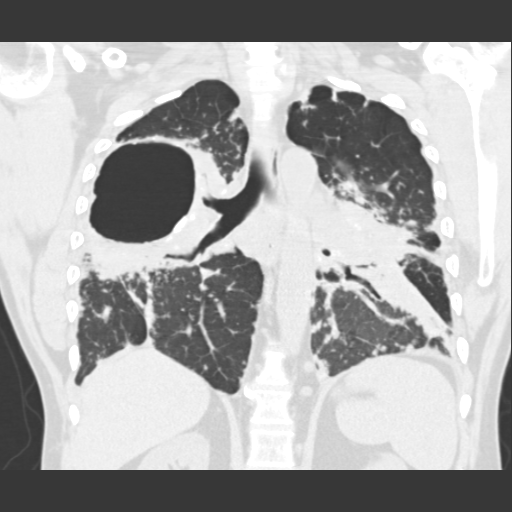

[15 of 36 positions shown; findings below may reference images not displayed]

FINDINGS: Calcified mediastinal and bi hilar adenopathy is unchanged. No
axillary adenopathy. Heart size normal. No pericardial effusion. Pre
pericardiac and juxta diaphragmatic lymph nodes measure up to 8 mm,
similar to the prior exam.

Confluent areas of perihilar consolidation and bronchiectasis have
progressed slightly in the interval, as has fairly diffuse
perilymphatic nodularity. Associated architectural distortion in the
lungs bilaterally. There is bilateral posterior pleural thickening.
No definite pleural fluid. Airway is otherwise unremarkable.

Incidental imaging of the upper abdomen suggests very mild marginal
irregularity of the liver. Visualized portions of the gallbladder,
adrenal glands, spleen, pancreas, stomach and bowel are otherwise
unremarkable. Hepatoduodenal ligament lymph node measures 1.5 cm,
incompletely imaged.

No worrisome lytic or sclerotic lesions.
IMPRESSION: 1. Mediastinal, hilar and pulmonary parenchymal changes of sarcoid.
Pulmonary parenchymal disease appears mildly progressive from
07/30/2012.
2. Mild marginal irregularity of the liver suggests cirrhosis.

## 2015-01-13 ENCOUNTER — Emergency Department (HOSPITAL_COMMUNITY): Payer: Commercial Managed Care - HMO

## 2015-01-13 ENCOUNTER — Encounter (HOSPITAL_COMMUNITY): Payer: Self-pay | Admitting: Emergency Medicine

## 2015-01-13 ENCOUNTER — Inpatient Hospital Stay (HOSPITAL_COMMUNITY)
Admission: EM | Admit: 2015-01-13 | Discharge: 2015-01-16 | DRG: 190 | Disposition: A | Discharge status: Home / Self Care | Payer: Commercial Managed Care - HMO | Attending: Internal Medicine | Admitting: Internal Medicine

## 2015-01-13 DIAGNOSIS — Z9981 Dependence on supplemental oxygen: Secondary | ICD-10-CM | POA: Diagnosis not present

## 2015-01-13 DIAGNOSIS — K219 Gastro-esophageal reflux disease without esophagitis: Secondary | ICD-10-CM | POA: Diagnosis present

## 2015-01-13 DIAGNOSIS — J9611 Chronic respiratory failure with hypoxia: Secondary | ICD-10-CM | POA: Diagnosis not present

## 2015-01-13 DIAGNOSIS — J471 Bronchiectasis with (acute) exacerbation: Principal | ICD-10-CM

## 2015-01-13 DIAGNOSIS — F1410 Cocaine abuse, uncomplicated: Secondary | ICD-10-CM | POA: Diagnosis present

## 2015-01-13 DIAGNOSIS — Z833 Family history of diabetes mellitus: Secondary | ICD-10-CM | POA: Diagnosis not present

## 2015-01-13 DIAGNOSIS — Z881 Allergy status to other antibiotic agents status: Secondary | ICD-10-CM | POA: Diagnosis not present

## 2015-01-13 DIAGNOSIS — J45909 Unspecified asthma, uncomplicated: Secondary | ICD-10-CM | POA: Diagnosis present

## 2015-01-13 DIAGNOSIS — R000 Tachycardia, unspecified: Secondary | ICD-10-CM | POA: Diagnosis present

## 2015-01-13 DIAGNOSIS — J189 Pneumonia, unspecified organism: Secondary | ICD-10-CM

## 2015-01-13 DIAGNOSIS — D869 Sarcoidosis, unspecified: Secondary | ICD-10-CM | POA: Diagnosis present

## 2015-01-13 DIAGNOSIS — K5909 Other constipation: Secondary | ICD-10-CM | POA: Diagnosis present

## 2015-01-13 DIAGNOSIS — I10 Essential (primary) hypertension: Secondary | ICD-10-CM | POA: Diagnosis present

## 2015-01-13 DIAGNOSIS — D649 Anemia, unspecified: Secondary | ICD-10-CM | POA: Diagnosis present

## 2015-01-13 DIAGNOSIS — G4733 Obstructive sleep apnea (adult) (pediatric): Secondary | ICD-10-CM | POA: Diagnosis present

## 2015-01-13 DIAGNOSIS — J9621 Acute and chronic respiratory failure with hypoxia: Secondary | ICD-10-CM | POA: Diagnosis present

## 2015-01-13 DIAGNOSIS — Y95 Nosocomial condition: Secondary | ICD-10-CM | POA: Diagnosis present

## 2015-01-13 DIAGNOSIS — J441 Chronic obstructive pulmonary disease with (acute) exacerbation: Secondary | ICD-10-CM | POA: Diagnosis present

## 2015-01-13 DIAGNOSIS — J479 Bronchiectasis, uncomplicated: Secondary | ICD-10-CM | POA: Insufficient documentation

## 2015-01-13 DIAGNOSIS — Z9989 Dependence on other enabling machines and devices: Secondary | ICD-10-CM

## 2015-01-13 LAB — CBC
HCT: 40.5 % (ref 39.0–52.0)
HEMOGLOBIN: 12.9 g/dL — AB (ref 13.0–17.0)
MCH: 27.5 pg (ref 26.0–34.0)
MCHC: 31.9 g/dL (ref 30.0–36.0)
MCV: 86.4 fL (ref 78.0–100.0)
Platelets: 278 10*3/uL (ref 150–400)
RBC: 4.69 MIL/uL (ref 4.22–5.81)
RDW: 13.3 % (ref 11.5–15.5)
WBC: 8.1 10*3/uL (ref 4.0–10.5)

## 2015-01-13 LAB — BASIC METABOLIC PANEL
Anion gap: 10 (ref 5–15)
BUN: 15 mg/dL (ref 6–20)
CHLORIDE: 100 mmol/L — AB (ref 101–111)
CO2: 30 mmol/L (ref 22–32)
CREATININE: 1.17 mg/dL (ref 0.61–1.24)
Calcium: 9.4 mg/dL (ref 8.9–10.3)
GFR calc non Af Amer: >60 mL/min (ref >60)
Glucose, Bld: 114 mg/dL — ABNORMAL HIGH (ref 65–99)
Potassium: 4 mmol/L (ref 3.5–5.1)
Sodium: 140 mmol/L (ref 135–145)

## 2015-01-13 LAB — RAPID URINE DRUG SCREEN, HOSP PERFORMED
AMPHETAMINES: POSITIVE — AB
Barbiturates: NOT DETECTED
Benzodiazepines: NOT DETECTED
Cocaine: POSITIVE — AB
Opiates: NOT DETECTED
Tetrahydrocannabinol: NOT DETECTED

## 2015-01-13 LAB — I-STAT TROPONIN, ED: Troponin i, poc: 0.01 ng/mL (ref 0.00–0.08)

## 2015-01-13 LAB — HEPATIC FUNCTION PANEL
ALBUMIN: 4.2 g/dL (ref 3.5–5.0)
ALK PHOS: 99 U/L (ref 38–126)
ALT: 20 U/L (ref 17–63)
AST: 32 U/L (ref 15–41)
Bilirubin, Direct: <0.1 mg/dL — ABNORMAL LOW (ref 0.1–0.5)
TOTAL PROTEIN: 7.4 g/dL (ref 6.5–8.1)
Total Bilirubin: 0.6 mg/dL (ref 0.3–1.2)

## 2015-01-13 LAB — INFLUENZA PANEL BY PCR (TYPE A & B)
H1N1FLUPCR: NOT DETECTED
Influenza A By PCR: NEGATIVE
Influenza B By PCR: NEGATIVE

## 2015-01-13 LAB — BRAIN NATRIURETIC PEPTIDE: B Natriuretic Peptide: 11.8 pg/mL (ref 0.0–100.0)

## 2015-01-13 LAB — EXPECTORATED SPUTUM ASSESSMENT W REFEX TO RESP CULTURE

## 2015-01-13 LAB — MRSA PCR SCREENING: MRSA by PCR: NEGATIVE

## 2015-01-13 LAB — STREP PNEUMONIAE URINARY ANTIGEN: STREP PNEUMO URINARY ANTIGEN: NEGATIVE

## 2015-01-13 MED ORDER — CYCLOBENZAPRINE HCL 10 MG PO TABS
10.0000 mg | ORAL_TABLET | Freq: Three times a day (TID) | ORAL | Status: DC | PRN
  Administered 2015-01-14 – 2015-01-15 (×2): 10 mg via ORAL
  Filled 2015-01-13 (×2): qty 1

## 2015-01-13 MED ORDER — SALINE SPRAY 0.65 % NA SOLN
1.0000 | NASAL | Status: DC | PRN
Start: 2015-01-13 — End: 2015-01-16
  Administered 2015-01-13: 1 via NASAL
  Filled 2015-01-13: qty 44

## 2015-01-13 MED ORDER — FERROUS SULFATE 325 (65 FE) MG PO TABS
325.0000 mg | ORAL_TABLET | Freq: Two times a day (BID) | ORAL | Status: DC
  Administered 2015-01-13 – 2015-01-16 (×6): 325 mg via ORAL
  Filled 2015-01-13 (×7): qty 1

## 2015-01-13 MED ORDER — METHYLPREDNISOLONE SODIUM SUCC 125 MG IJ SOLR
60.0000 mg | Freq: Three times a day (TID) | INTRAMUSCULAR | Status: DC
  Administered 2015-01-13 – 2015-01-14 (×2): 60 mg via INTRAVENOUS
  Filled 2015-01-13 (×3): qty 0.96

## 2015-01-13 MED ORDER — ALBUTEROL SULFATE (2.5 MG/3ML) 0.083% IN NEBU
2.5000 mg | INHALATION_SOLUTION | Freq: Four times a day (QID) | RESPIRATORY_TRACT | Status: DC | PRN
  Administered 2015-01-16: 2.5 mg via RESPIRATORY_TRACT
  Filled 2015-01-13: qty 3

## 2015-01-13 MED ORDER — IPRATROPIUM-ALBUTEROL 0.5-2.5 (3) MG/3ML IN SOLN
3.0000 mL | Freq: Once | RESPIRATORY_TRACT | Status: AC
  Administered 2015-01-13: 3 mL via RESPIRATORY_TRACT
  Filled 2015-01-13: qty 3

## 2015-01-13 MED ORDER — ALBUTEROL SULFATE (2.5 MG/3ML) 0.083% IN NEBU
5.0000 mg | INHALATION_SOLUTION | Freq: Once | RESPIRATORY_TRACT | Status: AC
  Administered 2015-01-13: 5 mg via RESPIRATORY_TRACT
  Filled 2015-01-13: qty 6

## 2015-01-13 MED ORDER — SODIUM CHLORIDE 0.9 % IJ SOLN: 3.0000 mL | Freq: Two times a day (BID) | INTRAMUSCULAR | Status: DC

## 2015-01-13 MED ORDER — ONDANSETRON HCL 4 MG PO TABS: 4.0000 mg | ORAL_TABLET | Freq: Three times a day (TID) | ORAL | Status: DC | PRN

## 2015-01-13 MED ORDER — BISOPROLOL FUMARATE 10 MG PO TABS: 10.0000 mg | ORAL_TABLET | Freq: Every day | ORAL | Status: DC

## 2015-01-13 MED ORDER — IPRATROPIUM-ALBUTEROL 0.5-2.5 (3) MG/3ML IN SOLN
3.0000 mL | RESPIRATORY_TRACT | Status: DC
  Administered 2015-01-13 – 2015-01-15 (×8): 3 mL via RESPIRATORY_TRACT
  Filled 2015-01-13 (×9): qty 3

## 2015-01-13 MED ORDER — ALBUTEROL SULFATE HFA 108 (90 BASE) MCG/ACT IN AERS: 2.0000 | INHALATION_SPRAY | Freq: Four times a day (QID) | RESPIRATORY_TRACT | Status: DC | PRN

## 2015-01-13 MED ORDER — FLUTICASONE PROPIONATE 50 MCG/ACT NA SUSP
2.0000 | Freq: Two times a day (BID) | NASAL | Status: DC | PRN
  Filled 2015-01-13: qty 16

## 2015-01-13 MED ORDER — ENOXAPARIN SODIUM 40 MG/0.4ML ~~LOC~~ SOLN
40.0000 mg | SUBCUTANEOUS | Status: DC
  Administered 2015-01-13 – 2015-01-15 (×3): 40 mg via SUBCUTANEOUS
  Filled 2015-01-13 (×4): qty 0.4

## 2015-01-13 MED ORDER — GUAIFENESIN ER 600 MG PO TB12
600.0000 mg | ORAL_TABLET | Freq: Two times a day (BID) | ORAL | Status: DC
  Administered 2015-01-13 – 2015-01-16 (×6): 600 mg via ORAL
  Filled 2015-01-13 (×8): qty 1

## 2015-01-13 MED ORDER — POLYETHYLENE GLYCOL 3350 17 G PO PACK: 17.0000 g | PACK | Freq: Every day | ORAL | Status: DC | PRN

## 2015-01-13 MED ORDER — BISACODYL 5 MG PO TBEC: 5.0000 mg | DELAYED_RELEASE_TABLET | Freq: Every day | ORAL | Status: DC | PRN

## 2015-01-13 MED ORDER — HYDRALAZINE HCL 20 MG/ML IJ SOLN: 10.0000 mg | Freq: Four times a day (QID) | INTRAMUSCULAR | Status: DC | PRN

## 2015-01-13 MED ORDER — FAMOTIDINE 20 MG PO TABS
20.0000 mg | ORAL_TABLET | Freq: Two times a day (BID) | ORAL | Status: DC
  Administered 2015-01-13 – 2015-01-16 (×7): 20 mg via ORAL
  Filled 2015-01-13 (×7): qty 1

## 2015-01-13 MED ORDER — DIPHENHYDRAMINE HCL 25 MG PO CAPS
25.0000 mg | ORAL_CAPSULE | Freq: Four times a day (QID) | ORAL | Status: DC | PRN
  Administered 2015-01-14 – 2015-01-15 (×3): 25 mg via ORAL
  Filled 2015-01-13 (×4): qty 1

## 2015-01-13 MED ORDER — ADULT MULTIVITAMIN W/MINERALS CH
1.0000 | ORAL_TABLET | Freq: Every morning | ORAL | Status: DC
  Administered 2015-01-14 – 2015-01-16 (×3): 1 via ORAL
  Filled 2015-01-13 (×3): qty 1

## 2015-01-13 MED ORDER — GUAIFENESIN ER 600 MG PO TB12
600.0000 mg | ORAL_TABLET | Freq: Two times a day (BID) | ORAL | Status: DC | PRN
  Administered 2015-01-16: 600 mg via ORAL

## 2015-01-13 MED ORDER — CALCIUM CARBONATE-VITAMIN D 500-200 MG-UNIT PO TABS
1.0000 | ORAL_TABLET | Freq: Every morning | ORAL | Status: DC
  Administered 2015-01-14 – 2015-01-16 (×3): 1 via ORAL
  Filled 2015-01-13 (×3): qty 1

## 2015-01-13 MED ORDER — VANCOMYCIN HCL IN DEXTROSE 1-5 GM/200ML-% IV SOLN
1000.0000 mg | Freq: Two times a day (BID) | INTRAVENOUS | Status: DC
  Administered 2015-01-13 – 2015-01-15 (×5): 1000 mg via INTRAVENOUS
  Filled 2015-01-13 (×5): qty 200

## 2015-01-13 MED ORDER — METHYLPREDNISOLONE SODIUM SUCC 125 MG IJ SOLR
125.0000 mg | Freq: Once | INTRAMUSCULAR | Status: AC
  Administered 2015-01-13: 125 mg via INTRAVENOUS
  Filled 2015-01-13: qty 2

## 2015-01-13 MED ORDER — DILTIAZEM HCL ER COATED BEADS 120 MG PO CP24
120.0000 mg | ORAL_CAPSULE | Freq: Every day | ORAL | Status: DC
  Administered 2015-01-14 – 2015-01-16 (×3): 120 mg via ORAL
  Filled 2015-01-13 (×3): qty 1

## 2015-01-13 MED ORDER — PIPERACILLIN-TAZOBACTAM 3.375 G IVPB
3.3750 g | Freq: Three times a day (TID) | INTRAVENOUS | Status: DC
  Administered 2015-01-13 – 2015-01-15 (×6): 3.375 g via INTRAVENOUS
  Filled 2015-01-13 (×7): qty 50

## 2015-01-13 MED ORDER — PIPERACILLIN-TAZOBACTAM 3.375 G IVPB 30 MIN
3.3750 g | Freq: Once | INTRAVENOUS | Status: AC
Start: 2015-01-13 — End: 2015-01-13
  Administered 2015-01-13: 3.375 g via INTRAVENOUS
  Filled 2015-01-13: qty 50

## 2015-01-13 NOTE — ED Provider Notes (Signed)
CSN: [id number]     Arrival date & time 01/13/15  1052 History   First MD Initiated Contact with Patient 01/13/15 1116     Chief Complaint  Patient presents with  . Shortness of Breath     (Consider location/radiation/quality/duration/timing/severity/associated sxs/prior Treatment) Patient is a 50 y.o. male presenting with shortness of breath.  Shortness of Breath Severity:  Severe Onset quality:  Gradual Duration:  2 days Timing:  Constant Progression:  Worsening Chronicity:  New Associated symptoms: chest pain (yesterday, tightness similar to prior exacerbations of COPD), cough, sore throat (from coughing), sputum production (green, thick) and wheezing   Associated symptoms: no abdominal pain, no diaphoresis, no fever, no headaches, no hemoptysis, no rash, no syncope and no vomiting   Risk factors: no prolonged immobilization and no recent surgery      Past Medical History  Diagnosis Date  . Hypertension   . Sarcoidosis (HCC)   . OSA on CPAP   . Chronic respiratory failure (HCC)     home O@ 4L (01/28/14)  . Bronchitis   . Pneumonia   . COPD (chronic obstructive pulmonary disease) (HCC)   . Neuromuscular disorder [facility])    Past Surgical History  Procedure Laterality Date  . Rotator cuff repair    . Arm surgery     . Video assisted thoracoscopy (vats)/thorocotomy Left 07/17/2012    Procedure: VIDEO ASSISTED THORACOSCOPY (VATS)/BLEB Stapling;  Surgeon: Alleen Borne, MD;  Location: [facility] OR;  Service: Thoracic;  Laterality: Left;   Family History  Problem Relation Age of Onset  . Diabetes Father    Social History  Substance Use Topics  . Smoking status: Never Smoker   . Smokeless tobacco: Never Used  . Alcohol Use: Yes     Comment: social    Review of Systems  Constitutional: Negative for fever and diaphoresis.  HENT: Positive for sore throat (from coughing).   Eyes: Negative for visual disturbance.  Respiratory: Positive for cough, sputum production (green, thick),  shortness of breath and wheezing. Negative for hemoptysis.   Cardiovascular: Positive for chest pain (yesterday, tightness similar to prior exacerbations of COPD). Negative for leg swelling and syncope.  Gastrointestinal: Negative for nausea, vomiting and abdominal pain.  Genitourinary: Negative for difficulty urinating.  Musculoskeletal: Negative for back pain and neck stiffness.  Skin: Negative for rash.  Neurological: Negative for syncope and headaches.      Allergies  Clindamycin/lincomycin  Home Medications   Prior to Admission medications   Medication Sig Start Date End Date Taking? Authorizing Provider  albuterol (PROVENTIL HFA;VENTOLIN HFA) 108 (90 BASE) MCG/ACT inhaler Inhale 2 puffs into the lungs every 6 (six) hours as needed for wheezing or shortness of breath. 09/10/14  Yes Leslye Peer, MD  albuterol (PROVENTIL) (2.5 MG/3ML) 0.083% nebulizer solution Take 2.5 mg by nebulization every 4 (four) hours as needed for wheezing or shortness of breath (wheezing and shortness of breath).  06/08/12  Yes Historical Provider, MD  bisacodyl (BISACODYL) 5 MG EC tablet Take 1 tablet (5 mg total) by mouth daily as needed for moderate constipation. 01/16/14  Yes Simonne Martinet, NP  bisoprolol (ZEBETA) 10 MG tablet Take 1 tablet (10 mg total) by mouth daily. 03/26/14  Yes Maryann Mikhail, DO  budesonide-formoterol (SYMBICORT) 160-4.5 MCG/ACT inhaler Inhale 2 puffs into the lungs 2 (two) times daily. 11/28/13  Yes Jeanella Craze, NP  calcium-vitamin D (OSCAL WITH D) 500-200 MG-UNIT per tablet Take 1 tablet by mouth every morning.   Yes Historical  Provider, MD  cyclobenzaprine (FLEXERIL) 10 MG tablet Take 10 mg by mouth 3 (three) times daily as needed for muscle spasms (muscle spasms).    Yes Historical Provider, MD  diltiazem (CARDIZEM CD) 120 MG 24 hr capsule Take 1 capsule (120 mg total) by mouth daily. 01/28/14  Yes Jeanella Craze, NP  diphenhydrAMINE (BENADRYL) 25 MG tablet Take 25 mg by mouth  every 6 (six) hours as needed for allergies (allergies).    Yes Historical Provider, MD  ferrous sulfate 325 (65 FE) MG tablet Take 1 tablet (325 mg total) by mouth 2 (two) times daily with a meal. 06/18/12  Yes Leroy Sea, MD  fluticasone (FLONASE) 50 MCG/ACT nasal spray Place 2 sprays into both nostrils 2 (two) times daily. Patient taking differently: Place 2 sprays into both nostrils 2 (two) times daily as needed for allergies.  01/28/14  Yes Jeanella Craze, NP  guaiFENesin (MUCINEX) 600 MG 12 hr tablet Take 1 tablet (600 mg total) by mouth 2 (two) times daily as needed (For congestion.). 06/19/14  Yes Leslye Peer, MD  Multiple Vitamins-Minerals (MULTIVITAMIN WITH MINERALS) tablet Take 1 tablet by mouth every morning.    Yes Historical Provider, MD  omeprazole (PRILOSEC) 40 MG capsule Take 40 mg by mouth daily. 11/04/14  Yes Historical Provider, MD  ondansetron (ZOFRAN) 4 MG tablet Take 4 mg by mouth every 8 (eight) hours as needed for nausea or vomiting.  09/05/14  Yes Historical Provider, MD  polyethylene glycol (MIRALAX / GLYCOLAX) packet Take 17 g by mouth daily. Patient taking differently: Take 17 g by mouth daily as needed for mild constipation or moderate constipation.  01/16/14  Yes Simonne Martinet, NP  famotidine (PEPCID) 20 MG tablet Take 1 tablet (20 mg total) by mouth 2 (two) times daily. Patient not taking: Reported on 01/13/2015 03/08/12   Renae Fickle, MD  ibuprofen (ADVIL,MOTRIN) 800 MG tablet Take 1 tablet (800 mg total) by mouth every 8 (eight) hours as needed for moderate pain. Patient not taking: Reported on 01/13/2015 10/03/14   Jeanella Craze, NP  oxyCODONE-acetaminophen (PERCOCET) 7.5-325 MG per tablet Take 1 tablet by mouth every 6 (six) hours as needed for severe pain. Patient not taking: Reported on 01/13/2015 10/03/14   Jeanella Craze, NP  predniSONE (DELTASONE) 10 MG tablet 30 mg po daily x 4 days, then 20mg  po daily x 4 days, then 10mg  po daily x 4 days Patient not taking:  Reported on 01/13/2015 10/03/14   Jeanella Craze, NP   BP 167/95 mmHg  Pulse 125  Temp(Src) 98.2 F (36.8 C) (Oral)  Resp 20  Ht 5\' 7"  (1.702 m)  Wt 169 lb 1.6 oz (76.703 kg)  BMI 26.48 kg/m2  SpO2 98% Physical Exam  Constitutional: He is oriented to person, place, and time. He appears well-developed and well-nourished. No distress.  HENT:  Head: Normocephalic and atraumatic.  Eyes: Conjunctivae and EOM are normal.  Neck: Normal range of motion.  Cardiovascular: Normal rate, regular rhythm, normal heart sounds and intact distal pulses.  Exam reveals no gallop and no friction rub.   No murmur heard. Pulmonary/Chest: Effort normal. No respiratory distress. He has wheezes. He has no rales.  Abdominal: Soft. He exhibits no distension. There is no tenderness. There is no guarding.  Musculoskeletal: He exhibits no edema.  Neurological: He is alert and oriented to person, place, and time.  Skin: Skin is warm and dry. No rash noted. He is not diaphoretic.  Nursing  note and vitals reviewed.   ED Course  Procedures (including critical care time) Labs Review Labs Reviewed  BASIC METABOLIC PANEL - Abnormal; Notable for the following:    Chloride 100 (*)    Glucose, Bld 114 (*)    All other components within normal limits  CBC - Abnormal; Notable for the following:    Hemoglobin 12.9 (*)    All other components within normal limits  HEPATIC FUNCTION PANEL - Abnormal; Notable for the following:    Bilirubin, Direct <0.1 (*)    All other components within normal limits  URINE RAPID DRUG SCREEN, HOSP PERFORMED - Abnormal; Notable for the following:    Cocaine POSITIVE (*)    Amphetamines POSITIVE (*)    All other components within normal limits  CULTURE, EXPECTORATED SPUTUM-ASSESSMENT  MRSA PCR SCREENING  CULTURE, BLOOD (ROUTINE X 2)  CULTURE, BLOOD (ROUTINE X 2)  RESPIRATORY VIRUS PANEL  CULTURE, RESPIRATORY (NON-EXPECTORATED)  BRAIN NATRIURETIC PEPTIDE  INFLUENZA PANEL BY PCR (TYPE  A & B, H1N1)  STREP PNEUMONIAE URINARY ANTIGEN  LEGIONELLA ANTIGEN, URINE  CBC  PROTIME-INR  COMPREHENSIVE METABOLIC PANEL  I-STAT TROPOININ, ED    Imaging Review Dg Chest 2 View  01/13/2015  CLINICAL DATA:  Worsening shortness of breath and productive cough in a patient with sarcoidosis. EXAM: CHEST  2 VIEW COMPARISON:  10/02/2014 FINDINGS: Stable bilateral pleural-parenchymal opacity compatible with reported history of sarcoidosis. Areas of more focal wall progressive opacity are seen in the left mid lung and peripheral right lower lung. The cardiopericardial silhouette is within normal limits for size. The visualized bony structures of the thorax are intact. Telemetry leads overlie the chest. IMPRESSION: Interval progression of bilateral ill-defined airspace opacities. Electronically Signed   By: Kennith CenterEric  Mansell M.D.   On: 01/13/2015 12:04   I have personally reviewed and evaluated these images and lab results as part of my medical decision-making.   EKG Interpretation None      MDM   Final diagnoses:  Bronchiectasis with acute exacerbation (HCC)  HCAP (healthcare-associated pneumonia)   50yo male with history of sarcoidosis, bronchiectasis followed by Dr. Delton CoombesByrum, COPD, OSA, chronic O2 3-4LpNC presents with concern for shortness of breath and cough. Pt with wheezing on exam, given solumedrol and duonebs.  XR shows progression fo airspace opacities and blood cx and vanc/zosyn ordered for possible HCAP.  Pulmonology consulted and pt admitted to Hospitalist service for continued care.      Alvira MondayErin Latanya Hemmer, MD 01/13/15 2153

## 2015-01-13 NOTE — H&P (Signed)
History and Physical  [name] ZOX:[id number] DOB: 1965-06-16 DOA: 01/13/2015  Referring physician: [name] PCP: Clydie Braun, MD   Chief Complaint: sob/cough/wheezing  HPI: [name] is a 50 y.o. male  is a 50 y.o. 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 He has history of frequent exacerbations. The patient presented to the [facility] emergency department due to worsening of sob/cough/wheezing, denies fever, no running nose, he reported sore throat due to frequent cough. Reported chronic constipation. Upon arrival to the ED, he is found to be wheezing, sinus tachycardia. Negative troponin, cxr showed progressive bilateral ill-defined airspace opacities. He was treated for pna in the hospital in 09/2014, he is started on vanc/zosyn in the ED for HCAP. He also received nebs and steroids, pulmonology consulted by [name], hospitalist called to admit the patient. Patient denies drug use, but UDS + amphetamines and cocaine.     Review of Systems:  Detail per HPI, Review of systems are otherwise negative  Past Medical History  Diagnosis Date  . Hypertension   . Sarcoidosis (HCC)   . OSA on CPAP   . Chronic respiratory failure (HCC)     home O@ 4L (01/28/14)  . Bronchitis   . Pneumonia   . COPD (chronic obstructive pulmonary disease) (HCC)   . Neuromuscular disorder [facility])    Past Surgical History  Procedure Laterality Date  . Rotator cuff repair    . Arm surgery     . Video assisted thoracoscopy (vats)/thorocotomy Left 07/17/2012    Procedure: VIDEO ASSISTED THORACOSCOPY (VATS)/BLEB Stapling;  Surgeon: Alleen Borne, MD;  Location: [facility] OR;  Service: Thoracic;  Laterality: Left;   Social History:  reports that he has never smoked. He has never used smokeless  tobacco. He reports that he drinks alcohol. He reports that he does not use illicit drugs. Patient lives at home & is able to participate in activities of daily living independently   Allergies  Allergen Reactions  . Clindamycin/Lincomycin Anaphylaxis and Swelling    Made face and tongue swell    Family History  Problem Relation Age of Onset  . Diabetes Father       Prior to Admission medications   Medication Sig Start Date End Date Taking? Authorizing Provider  albuterol (PROVENTIL HFA;VENTOLIN HFA) 108 (90 BASE) MCG/ACT inhaler Inhale 2 puffs into the lungs every 6 (six) hours as needed for wheezing or shortness of breath. 09/10/14  Yes Leslye Peer, MD  albuterol (PROVENTIL) (2.5 MG/3ML) 0.083% nebulizer solution Take 2.5 mg by nebulization every 4 (four) hours as needed for wheezing or shortness of breath (wheezing and shortness of breath).  06/08/12  Yes Historical Provider, MD  bisacodyl (BISACODYL) 5 MG EC tablet Take 1 tablet (5 mg total) by mouth daily as needed for moderate constipation. 01/16/14  Yes Simonne Martinet, NP  bisoprolol (ZEBETA) 10 MG tablet Take 1 tablet (10 mg total) by mouth daily. 03/26/14  Yes Maryann Mikhail, DO  budesonide-formoterol (SYMBICORT) 160-4.5 MCG/ACT inhaler Inhale 2 puffs into the lungs 2 (two) times daily. 11/28/13  Yes Jeanella Craze, NP  calcium-vitamin D (OSCAL WITH D) 500-200 MG-UNIT per tablet Take 1 tablet by mouth every morning.   Yes Historical Provider, MD  cyclobenzaprine (FLEXERIL) 10 MG tablet Take 10 mg by mouth 3 (three) times daily as  needed for muscle spasms (muscle spasms).    Yes Historical Provider, MD  diltiazem (CARDIZEM CD) 120 MG 24 hr capsule Take 1 capsule (120 mg total) by mouth daily. 01/28/14  Yes Jeanella CrazeBrandi L Ollis, NP  diphenhydrAMINE (BENADRYL) 25 MG tablet Take 25 mg by mouth every 6 (six) hours as needed for allergies (allergies).    Yes Historical Provider, MD  ferrous sulfate 325 (65 FE) MG tablet Take 1 tablet (325 mg  total) by mouth 2 (two) times daily with a meal. 06/18/12  Yes Leroy SeaPrashant K Singh, MD  fluticasone (FLONASE) 50 MCG/ACT nasal spray Place 2 sprays into both nostrils 2 (two) times daily. Patient taking differently: Place 2 sprays into both nostrils 2 (two) times daily as needed for allergies.  01/28/14  Yes Jeanella CrazeBrandi L Ollis, NP  guaiFENesin (MUCINEX) 600 MG 12 hr tablet Take 1 tablet (600 mg total) by mouth 2 (two) times daily as needed (For congestion.). 06/19/14  Yes Leslye Peerobert S Byrum, MD  Multiple Vitamins-Minerals (MULTIVITAMIN WITH MINERALS) tablet Take 1 tablet by mouth every morning.    Yes Historical Provider, MD  omeprazole (PRILOSEC) 40 MG capsule Take 40 mg by mouth daily. 11/04/14  Yes Historical Provider, MD  ondansetron (ZOFRAN) 4 MG tablet Take 4 mg by mouth every 8 (eight) hours as needed for nausea or vomiting.  09/05/14  Yes Historical Provider, MD  polyethylene glycol (MIRALAX / GLYCOLAX) packet Take 17 g by mouth daily. Patient taking differently: Take 17 g by mouth daily as needed for mild constipation or moderate constipation.  01/16/14  Yes Simonne MartinetPeter E Babcock, NP  famotidine (PEPCID) 20 MG tablet Take 1 tablet (20 mg total) by mouth 2 (two) times daily. Patient not taking: Reported on 01/13/2015 03/08/12   Renae FickleMackenzie Short, MD  ibuprofen (ADVIL,MOTRIN) 800 MG tablet Take 1 tablet (800 mg total) by mouth every 8 (eight) hours as needed for moderate pain. Patient not taking: Reported on 01/13/2015 10/03/14   Jeanella CrazeBrandi L Ollis, NP  oxyCODONE-acetaminophen (PERCOCET) 7.5-325 MG per tablet Take 1 tablet by mouth every 6 (six) hours as needed for severe pain. Patient not taking: Reported on 01/13/2015 10/03/14   Jeanella CrazeBrandi L Ollis, NP  predniSONE (DELTASONE) 10 MG tablet 30 mg po daily x 4 days, then 20mg  po daily x 4 days, then 10mg  po daily x 4 days Patient not taking: Reported on 01/13/2015 10/03/14   Jeanella CrazeBrandi L Ollis, NP    Physical Exam: BP 158/102 mmHg  Pulse 114  Temp(Src) 98.7 F (37.1 C) (Oral)  Resp 20   Wt 170 lb (77.111 kg)  SpO2 100%  General:  Sob, patient has audible wheezing upon me entering the room, able to talk in full sentences.  Eyes: PERRL ENT: unremarkable Neck: supple, no JVD Cardiovascular: sinus tachycardia Respiratory: diffuse bilateral wheezing Abdomen: soft/ND/ND, positive bowel sounds Skin: no rash Musculoskeletal:  No edema Psychiatric: calm/cooperative Neurologic: no focal findings            Labs on Admission:  Basic Metabolic Panel:  Recent Labs Lab 01/13/15 1205  NA 140  K 4.0  CL 100*  CO2 30  GLUCOSE 114*  BUN 15  CREATININE 1.17  CALCIUM 9.4   Liver Function Tests:  Recent Labs Lab 01/13/15 1205  AST 32  ALT 20  ALKPHOS 99  BILITOT 0.6  PROT 7.4  ALBUMIN 4.2   No results for input(s): LIPASE, AMYLASE in the last 168 hours. No results for input(s): AMMONIA in the last 168 hours. CBC:  Recent Labs Lab 01/13/15 1205  WBC 8.1  HGB 12.9*  HCT 40.5  MCV 86.4  PLT 278   Cardiac Enzymes: No results for input(s): CKTOTAL, CKMB, CKMBINDEX, TROPONINI in the last 168 hours.  BNP (last 3 results)  Recent Labs  01/23/14 0510 01/25/14 1100 01/13/15 1205  BNP 127.1* 98.1 11.8    ProBNP (last 3 results) No results for input(s): PROBNP in the last 8760 hours.  CBG: No results for input(s): GLUCAP in the last 168 hours.  Radiological Exams on Admission: Dg Chest 2 View  01/13/2015  CLINICAL DATA:  Worsening shortness of breath and productive cough in a patient with sarcoidosis. EXAM: CHEST  2 VIEW COMPARISON:  10/02/2014 FINDINGS: Stable bilateral pleural-parenchymal opacity compatible with reported history of sarcoidosis. Areas of more focal wall progressive opacity are seen in the left mid lung and peripheral right lower lung. The cardiopericardial silhouette is within normal limits for size. The visualized bony structures of the thorax are intact. Telemetry leads overlie the chest. IMPRESSION: Interval progression of bilateral  ill-defined airspace opacities. Electronically Signed   By: Kennith Center M.D.   On: 01/13/2015 12:04    Sinus tachycardia on tele  Assessment/Plan Present on Admission:  . HCAP (healthcare-associated pneumonia)   1. HCAP: sputum culture ordered, continue vanc/zosyn which was started in the ED,   2. Sarcoidosis flare up: steroids, nebs, abx, chest west, mucinex, pulmonology consult.  3. Acute on chronic respiratory failure due to 1and 2.   4. Htn: due to cocaine use, will avoid bisoprolol. Continue ccb, add hydralazine prn  5. osa : continue cpap   6. Substance abuse: + cocaine, +amphetamines, education provided,  DVT prophylaxis: lovenox  Consultants:  Pulmonary critical care  Code Status: full   Family Communication:  Patient   Disposition Plan: admit to med tele  Time spent:  Izeyah Deike MD, PhD [facility] Pager [phone] If 7PM-7AM, please contact night-coverage at [email or web address], password [facility]

## 2015-01-13 NOTE — Progress Notes (Signed)
RT brought CPAP to patient's room and set it up. RT added water and adjusted settings. Patient stated he would put the cpap on himself when he was ready for bed. RT encouraged patient to call if assistance is needed.

## 2015-01-13 NOTE — ED Notes (Signed)
Pt with Hx of sarcoidosis c/o productive cough, SOB, chest pain onset 7 days ago, worsened yesterday. Pt wears 3L/min O2 at home, has required more today.

## 2015-01-13 NOTE — Consult Note (Signed)
Name: [name] MRN: [id number] DOB: [birth year]    ADMISSION DATE:  01/13/2015 CONSULTATION DATE: 1/2  REFERRING MD :  XU  CHIEF COMPLAINT:  Acute on chronic respiratory failure   BRIEF PATIENT DESCRIPTION:   50 year old male f/b [place] w/ chronic respiratory failure in setting of fibrocavitary sarcoidosis. Admitted 1/2 w/ CAP vs BTX flare.   SIGNIFICANT EVENTS    STUDIES:     HISTORY OF PRESENT ILLNESS:   This is a 50 year old male, well known to our clinic w/ h/o chronic respiratory failure in setting of advanced fibrocavitary sarcoidosis. He has known right cavitary disease and BTX w/ frequent admits for decompensations, further c/b h/o cocaine abuse. Presents to the ED 1/2 w/ ~ 7d h/o intermittent cough, nasal congestion, HA, sore throat, and progressive SOB. This was at is worst on 1/1. Denied any sick exposure. Could not sleep the evening of 1/1 to 1/2. Cough became productive green/yellow thick sputum. Presented to the ED requiring higher O2 than baseline and no longer responding to home rescue BDs. Admitting CXR showed interval progression of bilateral airspace disease w/ acute on chronic changes. He was admitted w/ working dx of PNA. PCCM asked to assist with his pulmonary management.   PAST MEDICAL HISTORY :   has a past medical history of Hypertension; Sarcoidosis (HCC); OSA on CPAP; Chronic respiratory failure (HCC); Bronchitis; Pneumonia; COPD (chronic obstructive pulmonary disease) (HCC); and Neuromuscular disorder (HCC).  has past surgical history that includes Rotator cuff repair; arm surgery ; and Video assisted thoracoscopy (vats)/thorocotomy (Left, 07/17/2012). Prior to Admission medications   Medication Sig Start Date End Date Taking? Authorizing Provider  albuterol (PROVENTIL HFA;VENTOLIN HFA) 108 (90 BASE) MCG/ACT inhaler Inhale 2 puffs into the lungs every 6 (six) hours as needed for wheezing or shortness of breath. 09/10/14  Yes Leslye Peer, MD  albuterol  (PROVENTIL) (2.5 MG/3ML) 0.083% nebulizer solution Take 2.5 mg by nebulization every 4 (four) hours as needed for wheezing or shortness of breath (wheezing and shortness of breath).  06/08/12  Yes Historical Provider, MD  bisacodyl (BISACODYL) 5 MG EC tablet Take 1 tablet (5 mg total) by mouth daily as needed for moderate constipation. 01/16/14  Yes Simonne Martinet, NP  bisoprolol (ZEBETA) 10 MG tablet Take 1 tablet (10 mg total) by mouth daily. 03/26/14  Yes Maryann Mikhail, DO  budesonide-formoterol (SYMBICORT) 160-4.5 MCG/ACT inhaler Inhale 2 puffs into the lungs 2 (two) times daily. 11/28/13  Yes Jeanella Craze, NP  calcium-vitamin D (OSCAL WITH D) 500-200 MG-UNIT per tablet Take 1 tablet by mouth every morning.   Yes Historical Provider, MD  cyclobenzaprine (FLEXERIL) 10 MG tablet Take 10 mg by mouth 3 (three) times daily as needed for muscle spasms (muscle spasms).    Yes Historical Provider, MD  diltiazem (CARDIZEM CD) 120 MG 24 hr capsule Take 1 capsule (120 mg total) by mouth daily. 01/28/14  Yes Jeanella Craze, NP  diphenhydrAMINE (BENADRYL) 25 MG tablet Take 25 mg by mouth every 6 (six) hours as needed for allergies (allergies).    Yes Historical Provider, MD  ferrous sulfate 325 (65 FE) MG tablet Take 1 tablet (325 mg total) by mouth 2 (two) times daily with a meal. 06/18/12  Yes Leroy Sea, MD  fluticasone (FLONASE) 50 MCG/ACT nasal spray Place 2 sprays into both nostrils 2 (two) times daily. Patient taking differently: Place 2 sprays into both nostrils 2 (two) times daily as needed for allergies.  01/28/14  Yes Jeanella Craze, NP  guaiFENesin (MUCINEX) 600 MG 12 hr tablet Take 1 tablet (600 mg total) by mouth 2 (two) times daily as needed (For congestion.). 06/19/14  Yes Leslye Peer, MD  Multiple Vitamins-Minerals (MULTIVITAMIN WITH MINERALS) tablet Take 1 tablet by mouth every morning.    Yes Historical Provider, MD  omeprazole (PRILOSEC) 40 MG capsule Take 40 mg by mouth daily. 11/04/14   Yes Historical Provider, MD  ondansetron (ZOFRAN) 4 MG tablet Take 4 mg by mouth every 8 (eight) hours as needed for nausea or vomiting.  09/05/14  Yes Historical Provider, MD  polyethylene glycol (MIRALAX / GLYCOLAX) packet Take 17 g by mouth daily. Patient taking differently: Take 17 g by mouth daily as needed for mild constipation or moderate constipation.  01/16/14  Yes Simonne Martinet, NP  famotidine (PEPCID) 20 MG tablet Take 1 tablet (20 mg total) by mouth 2 (two) times daily. Patient not taking: Reported on 01/13/2015 03/08/12   Renae Fickle, MD  ibuprofen (ADVIL,MOTRIN) 800 MG tablet Take 1 tablet (800 mg total) by mouth every 8 (eight) hours as needed for moderate pain. Patient not taking: Reported on 01/13/2015 10/03/14   Jeanella Craze, NP  oxyCODONE-acetaminophen (PERCOCET) 7.5-325 MG per tablet Take 1 tablet by mouth every 6 (six) hours as needed for severe pain. Patient not taking: Reported on 01/13/2015 10/03/14   Jeanella Craze, NP  predniSONE (DELTASONE) 10 MG tablet 30 mg po daily x 4 days, then 20mg  po daily x 4 days, then 10mg  po daily x 4 days Patient not taking: Reported on 01/13/2015 10/03/14   Jeanella Craze, NP   Allergies  Allergen Reactions  . Clindamycin/Lincomycin Anaphylaxis and Swelling    Made face and tongue swell    FAMILY HISTORY:  family history includes Diabetes in his father. SOCIAL HISTORY:  reports that he has never smoked. He has never used smokeless tobacco. He reports that he drinks alcohol. He reports that he does not use illicit drugs.  REVIEW OF SYSTEMS (bolds positive):   Constitutional: Negative for fever, chills, weight loss, malaise/fatigue and diaphoresis.  HENT: Negative for hearing loss, ear pain, nosebleeds, congestion, sore throat, neck pain, tinnitus and ear discharge.   Eyes: Negative for blurred vision, double vision, photophobia, pain, discharge and redness.  Respiratory: Negative for cough, hemoptysis streaks but thinks from nose, sputum  production green brown, shortness of breath, wheezing and stridor.   Cardiovascular: Negative for chest pain, palpitations, orthopnea, claudication, leg swelling and PND.  Gastrointestinal: Negative for heartburn, nausea, vomiting, abdominal pain, diarrhea, constipation, blood in stool and melena.  Genitourinary: Negative for dysuria, urgency, frequency, hematuria and flank pain.  Musculoskeletal: Negative for myalgias, back pain, joint pain and falls.  Skin: Negative for itching and rash.  Neurological: Negative for dizziness, tingling, tremors, sensory change, speech change, focal weakness, seizures, loss of consciousness, weakness and headaches.  Endo/Heme/Allergies: Negative for environmental allergies and polydipsia. Does not bruise/bleed easily.  SUBJECTIVE:  Feels better after neb VITAL SIGNS: Temp:  [98.5 F (36.9 C)-98.7 F (37.1 C)] 98.7 F (37.1 C) (01/02 1411) Pulse Rate:  [114-115] 114 (01/02 1411) Resp:  [16-20] 20 (01/02 1411) BP: (123-158)/(87-102) 158/102 mmHg (01/02 1411) SpO2:  [98 %-100 %] 100 % (01/02 1411) Weight:  [77.111 kg (170 lb)] 77.111 kg (170 lb) (01/02 1342) 4 liters  PHYSICAL EXAMINATION: General:  Chronically ill appearing AAM. Resting in bed. Does have resting dyspnea  Neuro:  Awake, oriented. No focal def  HEENT:  NCAT, no JVd, No adenopathy  Cardiovascular:  rrr w/out MRG  Lungs:  Scattered rales, rhonchi & exp wheeze, some accessory muscle use after cough  Abdomen:  Soft, not tender, no OM  Musculoskeletal:  Equal w/out focal weakness.  Skin:  Warm, dry intact    Recent Labs Lab 01/13/15 1205  NA 140  K 4.0  CL 100*  CO2 30  BUN 15  CREATININE 1.17  GLUCOSE 114*    Recent Labs Lab 01/13/15 1205  HGB 12.9*  HCT 40.5  WBC 8.1  PLT 278   Dg Chest 2 View  01/13/2015  CLINICAL DATA:  Worsening shortness of breath and productive cough in a patient with sarcoidosis. EXAM: CHEST  2 VIEW COMPARISON:  10/02/2014 FINDINGS: Stable  bilateral pleural-parenchymal opacity compatible with reported history of sarcoidosis. Areas of more focal wall progressive opacity are seen in the left mid lung and peripheral right lower lung. The cardiopericardial silhouette is within normal limits for size. The visualized bony structures of the thorax are intact. Telemetry leads overlie the chest. IMPRESSION: Interval progression of bilateral ill-defined airspace opacities. Electronically Signed   By: Kennith CenterEric  Mansell M.D.   On: 01/13/2015 12:04    ASSESSMENT / PLAN:  Acute on chronic hypoxic respiratory failure in setting of CAP vs BTX flare in setting of severe fibro cavitary sarcoidosis.  -can't exclude element of cocaine related lung injury but favor infectious etiology  Plan Sputum culture  Agree w/ influenza PCR  and RVP testing Steroid taper  Broad spec abx (needs both pseudomonas and MRSA coverage given high likelihood of colonization) Scheduled BDs Flutter O2 as needed F/u CXR in am  Discussed cessation of cocaine   OSA Plan Auto-set CPAP at HS  HTN Plan Cont CCB   Simonne MartinetPeter E Cora Brierley ACNP-BC [facility] Pager # [phone] OR # [phone] if no answer   01/13/2015, 3:26 PM

## 2015-01-13 NOTE — Progress Notes (Signed)
ANTIBIOTIC CONSULT NOTE - INITIAL  Pharmacy Consult for Vancomycin/Zosyn Indication: pneumonia  Allergies  Allergen Reactions  . Clindamycin/Lincomycin Anaphylaxis and Swelling    Made face and tongue swell    Patient Measurements: Height: 67.5 inches TBW: 77kg IBW: 67 kg  Vital Signs: Temp: 98.5 F (36.9 C) (01/02 1106) Temp Source: Oral (01/02 1106) BP: 123/87 mmHg (01/02 1106) Pulse Rate: 115 (01/02 1106) Intake/Output from previous day:   Intake/Output from this shift:    Labs:  Recent Labs  01/13/15 1205  WBC 8.1  HGB 12.9*  PLT 278  CREATININE 1.17   CrCl cannot be calculated (Unknown ideal weight.). No results for input(s): VANCOTROUGH, VANCOPEAK, VANCORANDOM, GENTTROUGH, GENTPEAK, GENTRANDOM, TOBRATROUGH, TOBRAPEAK, TOBRARND, AMIKACINPEAK, AMIKACINTROU, AMIKACIN in the last 72 hours.   Microbiology: No results found for this or any previous visit (from the past 720 hour(s)).  Medical History: Past Medical History  Diagnosis Date  . Hypertension   . Sarcoidosis (HCC)   . OSA on CPAP   . Chronic respiratory failure (HCC)     home O@ 4L (01/28/14)  . Bronchitis   . Pneumonia   . COPD (chronic obstructive pulmonary disease) (HCC)   . Neuromuscular disorder (HCC)     Medications:  Scheduled:   Infusions:  . piperacillin-tazobactam     PRN:  Assessment: 49 yoM presenting with worsening SOB, sore throat, productive sputum and wheezing. Vanco and Zosyn to be started for PNA. Goal of Therapy:  Vancomycin trough level 15-20 mcg/ml  Eradication of infection Appropriate antibiotic dosing for indication and renal function.  Plan:  Will start Zosyn 3.375 Gm IV Q8h (4 hour infusion) Will start Vancomycin 1gm IV Q12h Will check Vanco trough level at steady state Will f/u Scr and Blood Cx   Dorethea ClanFrens, Tahmid Stonehocker Ann 01/13/2015,1:41 PM

## 2015-01-14 DIAGNOSIS — J471 Bronchiectasis with (acute) exacerbation: Secondary | ICD-10-CM

## 2015-01-14 LAB — COMPREHENSIVE METABOLIC PANEL
ALT: 19 U/L (ref 17–63)
AST: 30 U/L (ref 15–41)
Albumin: 4 g/dL (ref 3.5–5.0)
Alkaline Phosphatase: 97 U/L (ref 38–126)
Anion gap: 10 (ref 5–15)
BUN: 13 mg/dL (ref 6–20)
CALCIUM: 9.5 mg/dL (ref 8.9–10.3)
CO2: 27 mmol/L (ref 22–32)
CREATININE: 0.99 mg/dL (ref 0.61–1.24)
Chloride: 100 mmol/L — ABNORMAL LOW (ref 101–111)
GFR calc non Af Amer: >60 mL/min (ref >60)
Glucose, Bld: 202 mg/dL — ABNORMAL HIGH (ref 65–99)
Potassium: 4.2 mmol/L (ref 3.5–5.1)
SODIUM: 137 mmol/L (ref 135–145)
Total Bilirubin: 0.9 mg/dL (ref 0.3–1.2)
Total Protein: 7.4 g/dL (ref 6.5–8.1)

## 2015-01-14 LAB — RESPIRATORY VIRUS PANEL
Adenovirus: NEGATIVE
INFLUENZA A: NEGATIVE
INFLUENZA B 1: NEGATIVE
METAPNEUMOVIRUS: NEGATIVE
PARAINFLUENZA 1 A: NEGATIVE
Parainfluenza 2: NEGATIVE
Parainfluenza 3: NEGATIVE
RESPIRATORY SYNCYTIAL VIRUS A: NEGATIVE
Respiratory Syncytial Virus B: NEGATIVE
Rhinovirus: NEGATIVE

## 2015-01-14 LAB — CBC
HEMATOCRIT: 40.7 % (ref 39.0–52.0)
HEMOGLOBIN: 12.9 g/dL — AB (ref 13.0–17.0)
MCH: 27.5 pg (ref 26.0–34.0)
MCHC: 31.7 g/dL (ref 30.0–36.0)
MCV: 86.8 fL (ref 78.0–100.0)
Platelets: 301 10*3/uL (ref 150–400)
RBC: 4.69 MIL/uL (ref 4.22–5.81)
RDW: 13.2 % (ref 11.5–15.5)
WBC: 7.1 10*3/uL (ref 4.0–10.5)

## 2015-01-14 LAB — LEGIONELLA ANTIGEN, URINE

## 2015-01-14 LAB — PROTIME-INR
INR: 1.04 (ref 0.00–1.49)
Prothrombin Time: 13.8 seconds (ref 11.6–15.2)

## 2015-01-14 MED ORDER — TRAMADOL HCL 50 MG PO TABS
100.0000 mg | ORAL_TABLET | Freq: Four times a day (QID) | ORAL | Status: DC | PRN
  Administered 2015-01-14 – 2015-01-16 (×3): 100 mg via ORAL
  Filled 2015-01-14 (×3): qty 2

## 2015-01-14 MED ORDER — SODIUM CHLORIDE 0.9 % IV SOLN
INTRAVENOUS | Status: DC
  Administered 2015-01-14 – 2015-01-16 (×4): via INTRAVENOUS

## 2015-01-14 MED ORDER — METHYLPREDNISOLONE SODIUM SUCC 40 MG IJ SOLR
40.0000 mg | Freq: Two times a day (BID) | INTRAMUSCULAR | Status: DC
Start: 2015-01-14 — End: 2015-01-16
  Administered 2015-01-14 – 2015-01-16 (×4): 40 mg via INTRAVENOUS
  Filled 2015-01-14 (×5): qty 1

## 2015-01-14 MED ORDER — KETOROLAC TROMETHAMINE 15 MG/ML IJ SOLN
15.0000 mg | Freq: Three times a day (TID) | INTRAMUSCULAR | Status: DC | PRN
  Administered 2015-01-14 – 2015-01-15 (×2): 15 mg via INTRAVENOUS
  Filled 2015-01-14 (×2): qty 1

## 2015-01-14 MED ORDER — MORPHINE SULFATE (PF) 2 MG/ML IV SOLN: 2.0000 mg | Freq: Once | INTRAVENOUS | Status: DC

## 2015-01-14 MED ORDER — IBUPROFEN 200 MG PO TABS
400.0000 mg | ORAL_TABLET | Freq: Four times a day (QID) | ORAL | Status: DC | PRN
  Administered 2015-01-14: 400 mg via ORAL
  Filled 2015-01-14: qty 2

## 2015-01-14 NOTE — Progress Notes (Signed)
   Name: [name] MRN: [id number] DOB: 10/02/1965    ADMISSION DATE:  01/13/2015 CONSULTATION DATE: 1/2  REFERRING MD :  XU  CHIEF COMPLAINT:  Acute on chronic respiratory failure   BRIEF PATIENT DESCRIPTION:   [age] year old male f/b [place] w/ chronic respiratory failure in setting of fibrocavitary sarcoidosis. Admitted 1/2 w/ CAP vs BTX flare.   SIGNIFICANT EVENTS    STUDIES:    SUBJECTIVE:  Feels much better   VITAL SIGNS: Temp:  [98 F (36.7 C)-98.7 F (37.1 C)] 98 F (36.7 C) (01/03 0603) Pulse Rate:  [113-125] 113 (01/03 1018) Resp:  [20-24] 20 (01/03 0603) BP: (150-167)/(90-102) 155/90 mmHg (01/03 1018) SpO2:  [98 %-100 %] 99 % (01/03 0603) Weight:  [76.703 kg (169 lb 1.6 oz)-77.111 kg (170 lb)] 76.703 kg (169 lb 1.6 oz) (01/02 1612) 4 liters  PHYSICAL EXAMINATION: General:  Chronically ill appearing AAM. Sitting up at bedside. No distress today  Neuro:  Awake, oriented. No focal def  HEENT:  NCAT, no JVd, No adenopathy  Cardiovascular:  rrr w/out MRG  Lungs:  Some posterior crackles, no wheeze today, improved wob and no accessory use today Abdomen:  Soft, not tender, no OM  Musculoskeletal:  Equal w/out focal weakness.  Skin:  Warm, dry intact    Recent Labs Lab 01/13/15 1205 01/14/15 0432  NA 140 137  K 4.0 4.2  CL 100* 100*  CO2 30 27  BUN 15 13  CREATININE 1.17 0.99  GLUCOSE 114* 202*    Recent Labs Lab 01/13/15 1205 01/14/15 0432  HGB 12.9* 12.9*  HCT 40.5 40.7  WBC 8.1 7.1  PLT 278 301   Dg Chest 2 View  01/13/2015  CLINICAL DATA:  Worsening shortness of breath and productive cough in a patient with sarcoidosis. EXAM: CHEST  2 VIEW COMPARISON:  10/02/2014 FINDINGS: Stable bilateral pleural-parenchymal opacity compatible with reported history of sarcoidosis. Areas of more focal wall progressive opacity are seen in the left mid lung and peripheral right lower lung. The cardiopericardial silhouette is within normal limits for size. The  visualized bony structures of the thorax are intact. Telemetry leads overlie the chest. IMPRESSION: Interval progression of bilateral ill-defined airspace opacities. Electronically Signed   By: Kennith CenterEric  Mansell M.D.   On: 01/13/2015 12:04    ASSESSMENT / PLAN:  Acute on chronic hypoxic respiratory failure in setting of CAP vs BTX flare in setting of severe fibro cavitary sarcoidosis.  -clinically much improved. Cough still productive of green/brown sputum, wheezing cleared.  - U strep neg - influenza PCR is neg Plan F/u Sputum culture  F/u RVP testing Steroid taper (changed to 40 q12) Broad spec abx (needs both pseudomonas and MRSA coverage given high likelihood of colonization) Scheduled BDs Flutter O2 as needed F/u CXR in am  Discussed cessation of cocaine   OSA Plan Auto-set CPAP at HS  HTN Plan Cont CCB   Simonne MartinetPeter E Rasmus Preusser ACNP-BC [facility] Pager # [phone] OR # [phone] if no answer   01/14/2015, 1:16 PM

## 2015-01-14 NOTE — Progress Notes (Signed)
Called to room because patient complaining of 10/10 chest pain that is burning and tight in his mid-left side of his chest. VSS, EKG performed, NP on call notified and new orders given. Will continue to monitor closely.

## 2015-01-14 NOTE — Progress Notes (Signed)
Patient stated he would put the cpap on tonight when he is ready for bed.

## 2015-01-14 NOTE — Progress Notes (Signed)
Vest therapy and neb not done due to pt sleeping. Pt explained to rt this am he needs rest. No distress noted at this time.

## 2015-01-14 NOTE — Progress Notes (Signed)
RN, Colin MuldersBrianna, paged because pt having CP. Began about 5 mins ago, "10/10" described as burning/pressure to the mid and left sternum. No pain radiating to arm or jaw. No n/v or diaphoresis. No distress. Chronically on O2. 12 lead without acute changes. This is likely related to pt's cocaine use. Will try Tramadol and if no relief, one dose of Morphine. Do not feel troponins are warranted at this time. Will follow.  KJKG, NP [facility]

## 2015-01-14 NOTE — Progress Notes (Signed)
Pt refused vest therapy and neb. No distress noted at this time.

## 2015-01-14 NOTE — Progress Notes (Signed)
PROGRESS NOTE  [name] WUJ:[id number] DOB: December 21, 1965 DOA: 01/13/2015 PCP: Clydie Braun, MD  HPI/Recap of past 24 hours:  Feeling better, sitting in chair  Assessment/Plan: Active Problems:   Bronchiectasis (HCC)   HCAP (healthcare-associated pneumonia)  1. HCAP: sputum culture pending, continue vanc/zosyn which was started in the ED,   2. Sarcoidosis flare up: steroids, nebs, abx, chest west, mucinex, pulmonology consult.  3. Acute on chronic respiratory failure due to 1and 2.   4. Htn: due to cocaine use, will avoid bisoprolol. Continue ccb, add hydralazine prn  5. osa : continue cpap   6. Substance abuse: + cocaine, +amphetamines, education provided,  DVT prophylaxis: lovenox  Consultants:  Pulmonary critical care  Code Status: full   Family Communication: Patient    Disposition Plan: home when medically stable, likely 1-2 days   Consultants:  pccm  Procedures:  none  Antibiotics:  vanc/zosyn   Objective: BP 155/104 mmHg  Pulse 124  Temp(Src) 98.6 F (37 C) (Oral)  Resp 20  Ht 5\' 7"  (1.702 m)  Wt 169 lb 1.6 oz (76.703 kg)  BMI 26.48 kg/m2  SpO2 99%  Intake/Output Summary (Last 24 hours) at 01/14/15 2242 Last data filed at 01/14/15 1800  Gross per 24 hour  Intake   2280 ml  Output    850 ml  Net   1430 ml   Filed Weights   01/13/15 1342 01/13/15 1612  Weight: 170 lb (77.111 kg) 169 lb 1.6 oz (76.703 kg)    Exam:   General:  NAD  Cardiovascular: RRR  Respiratory: diffuse severe bilateral wheezing has largely resolved.  Abdomen: Soft/ND/NT, positive BS  Musculoskeletal: No Edema  Neuro: aaox3  Data Reviewed: Basic Metabolic Panel:  Recent Labs Lab 01/13/15 1205 01/14/15 0432  NA 140 137  K 4.0 4.2  CL 100* 100*  CO2 30 27  GLUCOSE 114* 202*  BUN 15 13  CREATININE 1.17 0.99  CALCIUM 9.4 9.5   Liver Function Tests:  Recent Labs Lab 01/13/15 1205 01/14/15 0432  AST 32 30  ALT 20 19    ALKPHOS 99 97  BILITOT 0.6 0.9  PROT 7.4 7.4  ALBUMIN 4.2 4.0   No results for input(s): LIPASE, AMYLASE in the last 168 hours. No results for input(s): AMMONIA in the last 168 hours. CBC:  Recent Labs Lab 01/13/15 1205 01/14/15 0432  WBC 8.1 7.1  HGB 12.9* 12.9*  HCT 40.5 40.7  MCV 86.4 86.8  PLT 278 301   Cardiac Enzymes:   No results for input(s): CKTOTAL, CKMB, CKMBINDEX, TROPONINI in the last 168 hours. BNP (last 3 results)  Recent Labs  01/23/14 0510 01/25/14 1100 01/13/15 1205  BNP 127.1* 98.1 11.8    ProBNP (last 3 results) No results for input(s): PROBNP in the last 8760 hours.  CBG: No results for input(s): GLUCAP in the last 168 hours.  Recent Results (from the past 240 hour(s))  MRSA PCR Screening     Status: None   Collection Time: 01/13/15  4:15 PM  Result Value Ref Range Status   MRSA by PCR NEGATIVE NEGATIVE Final    Comment:        The GeneXpert MRSA Assay (FDA approved for NASAL specimens only), is one component of a comprehensive MRSA colonization surveillance program. It is not intended to diagnose MRSA infection nor to guide or monitor treatment for MRSA infections.   Culture, expectorated sputum-assessment     Status: None   Collection Time: 01/13/15  6:20 PM  Result Value Ref Range Status   Specimen Description SPUTUM  Final   Special Requests NONE  Final   Sputum evaluation   Final    THIS SPECIMEN IS ACCEPTABLE. RESPIRATORY CULTURE REPORT TO FOLLOW.   Report Status 01/13/2015 FINAL  Final  Culture, respiratory (NON-Expectorated)     Status: None (Preliminary result)   Collection Time: 01/13/15  6:20 PM  Result Value Ref Range Status   Specimen Description SPUTUM  Final   Special Requests NONE  Final   Gram Stain   Final    MODERATE WBC PRESENT, PREDOMINANTLY PMN FEW SQUAMOUS EPITHELIAL CELLS PRESENT MODERATE GRAM POSITIVE COCCI IN PAIRS IN CHAINS IN CLUSTERS MODERATE GRAM NEGATIVE COCCI RARE GRAM NEGATIVE RODS     Culture PENDING  Incomplete   Report Status PENDING  Incomplete     Studies: No results found.  Scheduled Meds: . calcium-vitamin D  1 tablet Oral q morning - 10a  . diltiazem  120 mg Oral Daily  . enoxaparin (LOVENOX) injection  40 mg Subcutaneous Q24H  . famotidine  20 mg Oral BID  . ferrous sulfate  325 mg Oral BID WC  . guaiFENesin  600 mg Oral BID  . ipratropium-albuterol  3 mL Nebulization Q4H  . methylPREDNISolone (SOLU-MEDROL) injection  40 mg Intravenous Q12H  .  morphine injection  2 mg Intravenous Once  . multivitamin with minerals  1 tablet Oral q morning - 10a  . piperacillin-tazobactam (ZOSYN)  IV  3.375 g Intravenous Q8H  . sodium chloride  3 mL Intravenous Q12H  . vancomycin  1,000 mg Intravenous Q12H    Continuous Infusions: . sodium chloride 75 mL/hr at 01/14/15 2203     Time spent: 25mins  Odeth Bry MD, PhD  [facility] Pager [phone]. If 7PM-7AM, please contact night-coverage at [email or web address], password [email or web address] 01/14/2015, 10:42 PM  LOS: 1 day

## 2015-01-14 NOTE — Care Management Note (Signed)
Case Management Note  Patient Details  Name: [name] MRN: [id number] Date of Birth: 05/13/1965  Subjective/Objective: 50 y/o m admitted w/PNA. From home. Has home 02.                   Action/Plan:d/c plan home.   Expected Discharge Date:   (unknown)               Expected Discharge Plan:  Home/Self Care  In-House Referral:     Discharge planning Services  CM Consult  Post Acute Care Choice:  Durable Medical Equipment (home [phone]) Choice offered to:     DME Arranged:    DME Agency:     HH Arranged:    HH Agency:     Status of Service:  In process, will continue to follow  Medicare Important Message Given:    Date Medicare IM Given:    Medicare IM give by:    Date Additional Medicare IM Given:    Additional Medicare Important Message give by:     If discussed at Long Length of Stay Meetings, dates discussed:    Additional Comments:  Lanier ClamMahabir, Yosiah Jasmin, RN 01/14/2015, 4:20 PM

## 2015-01-15 DIAGNOSIS — J471 Bronchiectasis with (acute) exacerbation: Secondary | ICD-10-CM | POA: Diagnosis present

## 2015-01-15 MED ORDER — IPRATROPIUM-ALBUTEROL 0.5-2.5 (3) MG/3ML IN SOLN
3.0000 mL | Freq: Four times a day (QID) | RESPIRATORY_TRACT | Status: DC
  Administered 2015-01-15 – 2015-01-16 (×3): 3 mL via RESPIRATORY_TRACT
  Filled 2015-01-15 (×3): qty 3

## 2015-01-15 MED ORDER — LEVOFLOXACIN 750 MG PO TABS
750.0000 mg | ORAL_TABLET | ORAL | Status: DC
  Administered 2015-01-15: 750 mg via ORAL
  Filled 2015-01-15 (×2): qty 1

## 2015-01-15 MED ORDER — HYDROCOD POLST-CPM POLST ER 10-8 MG/5ML PO SUER
5.0000 mL | Freq: Once | ORAL | Status: AC
  Administered 2015-01-15: 5 mL via ORAL
  Filled 2015-01-15: qty 5

## 2015-01-15 NOTE — Progress Notes (Signed)
Patient refused CPT by chest vest at this time

## 2015-01-15 NOTE — Progress Notes (Signed)
[facility]                                                                              Patient Demographics  [name] Baumannommy [name], is a 50 y.o. male, DOB - 10/06/1965, XBM:[id number]  Admit date - 01/13/2015   Admitting Physician Albertine GratesFang Xu, MD  Outpatient Primary MD for the patient is Clydie BraunENIZARD-THOMPSON, NANCY, MD  LOS - 2   Chief Complaint  Patient presents with  . 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 Patient admitted for pneumonia. PCCM consulted.  Assessment & Plan   HCAP -Chest x-ray shows interval progression of bilateral ill-defined airspace opacities -Continue vancomycin and Zosyn, Mucinex, nebs -Respiratory viral panel and influenza negative -PCCM consulted and appreciated -Sputum culture pending -Blood cultures negative to date  Sarcoidosis Flare -Continue steroids, antibiotics, flutter valve and chest PT, bronchodilators  Acute on chronic respiratory failure -Continue supplemental oxygen, patient wears 4 L at home  Polysubstance abuse -UDS positive for amphetamines and cocaine -Patient counseled  Obstructive sleep apnea -Continue CPAP  Hypertension -Continue Cardizem, beta blocker held due to cocaine use  Code Status: Full  Family Communication: None at bedside  Disposition Plan: Admitted. Pending further recommendations from pulmonology  Time Spent in minutes   30 minutes  Procedures  None  Consults   PCCM  DVT Prophylaxis  Lovenox  Lab Results  Component Value Date   PLT 301 01/14/2015    Medications  Scheduled Meds: . calcium-vitamin D  1 tablet Oral q morning - 10a  . diltiazem  120 mg Oral Daily  . enoxaparin (LOVENOX) injection  40 mg Subcutaneous Q24H  . famotidine  20 mg Oral BID  .  ferrous sulfate  325 mg Oral BID WC  . guaiFENesin  600 mg Oral BID  . ipratropium-albuterol  3 mL Nebulization Q6H  . methylPREDNISolone (SOLU-MEDROL) injection  40 mg Intravenous Q12H  .  morphine injection  2 mg Intravenous Once  . multivitamin with minerals  1 tablet Oral q morning - 10a  . piperacillin-tazobactam (ZOSYN)  IV  3.375 g Intravenous Q8H  . sodium chloride  3 mL Intravenous Q12H  . vancomycin  1,000 mg Intravenous Q12H   Continuous Infusions: . sodium chloride 75 mL/hr at 01/15/15 1227   PRN Meds:.albuterol, bisacodyl, cyclobenzaprine, diphenhydrAMINE, fluticasone, guaiFENesin, hydrALAZINE, ketorolac, ondansetron, polyethylene glycol, sodium chloride, traMADol  Antibiotics    Anti-infectives    Start     Dose/Rate Route Frequency Ordered Stop   01/13/15 2200  piperacillin-tazobactam (ZOSYN) IVPB 3.375 g     3.375 g 12.5 mL/hr over 240 Minutes Intravenous Every 8 hours 01/13/15 1354     01/13/15 1400  vancomycin (VANCOCIN) IVPB 1000 mg/200 mL premix     1,000 mg 200 mL/hr over 60 Minutes Intravenous Every 12 hours 01/13/15 1352     01/13/15 1345  piperacillin-tazobactam (ZOSYN) IVPB 3.375 g     3.375 g 100 mL/hr over 30 Minutes Intravenous  Once 01/13/15  1340 01/13/15 1517      Subjective:   [name] seen and examined today.  Patient feels he is improving slowly. Denies any chest pain, abdominal pain, nausea or vomiting. Does feel his breathing is improved although now is wheezing with mild cough.  Objective:   Filed Vitals:   01/15/15 0045 01/15/15 0340 01/15/15 0557 01/15/15 0948  BP:   144/96   Pulse:   114   Temp:   97.7 F (36.5 C)   TempSrc:   Oral   Resp:   20   Height:      Weight:      SpO2: 100% 100% 100% 100%    Wt Readings from Last 3 Encounters:  01/13/15 76.703 kg (169 lb 1.6 oz)  10/11/14 83.825 kg (184 lb 12.8 oz)  09/29/14 82.1 kg (181 lb)     Intake/Output Summary (Last 24 hours) at 01/15/15 1422 Last data filed at  01/15/15 9562  Gross per 24 hour  Intake   2985 ml  Output    900 ml  Net   2085 ml    Exam  General: Well developed, well nourished, NAD, appears stated age  HEENT: NCAT,  mucous membranes moist.   Cardiovascular: S1 S2 auscultated, tachycardic  Respiratory: Diffuse Expiratory wheezing, diminished breath sounds  Abdomen: Soft, nontender, nondistended, + bowel sounds  Extremities: warm dry without cyanosis clubbing or edema  Neuro: AAOx3, nonfocal  Psych: Normal affect and demeanor   Data Review   Micro Results Recent Results (from the past 240 hour(s))  Respiratory virus panel     Status: None   Collection Time: 01/13/15  2:49 PM  Result Value Ref Range Status   Source - RVPAN NOSE  Corrected   Respiratory Syncytial Virus A Negative Negative Final   Respiratory Syncytial Virus B Negative Negative Final   Influenza A Negative Negative Final   Influenza B Negative Negative Final   Parainfluenza 1 Negative Negative Final   Parainfluenza 2 Negative Negative Final   Parainfluenza 3 Negative Negative Final   Metapneumovirus Negative Negative Final   Rhinovirus Negative Negative Final   Adenovirus Negative Negative Final    Comment: (NOTE) Performed At: [facility] [address] [place], [place] [id number] Mila Homer MD ON:[id number]   MRSA PCR Screening     Status: None   Collection Time: 01/13/15  4:15 PM  Result Value Ref Range Status   MRSA by PCR NEGATIVE NEGATIVE Final    Comment:        The GeneXpert MRSA Assay (FDA approved for NASAL specimens only), is one component of a comprehensive MRSA colonization surveillance program. It is not intended to diagnose MRSA infection nor to guide or monitor treatment for MRSA infections.   Culture, expectorated sputum-assessment     Status: None   Collection Time: 01/13/15  6:20 PM  Result Value Ref Range Status   Specimen Description SPUTUM  Final   Special Requests NONE  Final   Sputum  evaluation   Final    THIS SPECIMEN IS ACCEPTABLE. RESPIRATORY CULTURE REPORT TO FOLLOW.   Report Status 01/13/2015 FINAL  Final  Culture, respiratory (NON-Expectorated)     Status: None (Preliminary result)   Collection Time: 01/13/15  6:20 PM  Result Value Ref Range Status   Specimen Description SPUTUM  Final   Special Requests NONE  Final   Gram Stain   Final    MODERATE WBC PRESENT, PREDOMINANTLY PMN FEW SQUAMOUS EPITHELIAL CELLS PRESENT MODERATE GRAM POSITIVE  COCCI IN PAIRS IN CHAINS IN CLUSTERS MODERATE GRAM NEGATIVE COCCI RARE GRAM NEGATIVE RODS    Culture   Final    Culture reincubated for better growth Performed at [facility]    Report Status PENDING  Incomplete    Radiology Reports Dg Chest 2 View  01/13/2015  CLINICAL DATA:  Worsening shortness of breath and productive cough in a patient with sarcoidosis. EXAM: CHEST  2 VIEW COMPARISON:  10/02/2014 FINDINGS: Stable bilateral pleural-parenchymal opacity compatible with reported history of sarcoidosis. Areas of more focal wall progressive opacity are seen in the left mid lung and peripheral right lower lung. The cardiopericardial silhouette is within normal limits for size. The visualized bony structures of the thorax are intact. Telemetry leads overlie the chest. IMPRESSION: Interval progression of bilateral ill-defined airspace opacities. Electronically Signed   By: Kennith Center M.D.   On: 01/13/2015 12:04    CBC  Recent Labs Lab 01/13/15 1205 01/14/15 0432  WBC 8.1 7.1  HGB 12.9* 12.9*  HCT 40.5 40.7  PLT 278 301  MCV 86.4 86.8  MCH 27.5 27.5  MCHC 31.9 31.7  RDW 13.3 13.2    Chemistries   Recent Labs Lab 01/13/15 1205 01/14/15 0432  NA 140 137  K 4.0 4.2  CL 100* 100*  CO2 30 27  GLUCOSE 114* 202*  BUN 15 13  CREATININE 1.17 0.99  CALCIUM 9.4 9.5  AST 32 30  ALT 20 19  ALKPHOS 99 97  BILITOT 0.6 0.9    ------------------------------------------------------------------------------------------------------------------ estimated creatinine clearance is 83.5 mL/min (by C-G formula based on Cr of 0.99). ------------------------------------------------------------------------------------------------------------------ No results for input(s): HGBA1C in the last 72 hours. ------------------------------------------------------------------------------------------------------------------ No results for input(s): CHOL, HDL, LDLCALC, TRIG, CHOLHDL, LDLDIRECT in the last 72 hours. ------------------------------------------------------------------------------------------------------------------ No results for input(s): TSH, T4TOTAL, T3FREE, THYROIDAB in the last 72 hours.  Invalid input(s): FREET3 ------------------------------------------------------------------------------------------------------------------ No results for input(s): VITAMINB12, FOLATE, FERRITIN, TIBC, IRON, RETICCTPCT in the last 72 hours.  Coagulation profile  Recent Labs Lab 01/14/15 0432  INR 1.04    No results for input(s): DDIMER in the last 72 hours.  Cardiac Enzymes No results for input(s): CKMB, TROPONINI, MYOGLOBIN in the last 168 hours.  Invalid input(s): CK ------------------------------------------------------------------------------------------------------------------ Invalid input(s): POCBNP    Kamala Kolton D.O. on 01/15/2015 at 2:22 PM  Between 7am to 7pm - Pager - [phone]  After 7pm go to [email or web address] - password [email or web address]  And look for the night coverage person covering for me after hours  [facility] Office  [phone]

## 2015-01-15 NOTE — Progress Notes (Signed)
   Name: [name] MRN: [id number] DOB: 03/07/1965    ADMISSION DATE:  01/13/2015 CONSULTATION DATE: 1/2  REFERRING MD :  XU  CHIEF COMPLAINT:  Acute on chronic respiratory failure   BRIEF PATIENT DESCRIPTION:   50 year old male f/b [place] w/ chronic respiratory failure in setting of fibrocavitary sarcoidosis. Admitted 1/2 w/ CAP vs BTX flare.   SIGNIFICANT EVENTS    STUDIES:    SUBJECTIVE:  Feeling better today   VITAL SIGNS: Temp:  [97.7 F (36.5 C)-98.6 F (37 C)] 98.2 F (36.8 C) (01/04 1437) Pulse Rate:  [110-124] 110 (01/04 1437) Resp:  [18-20] 20 (01/04 1437) BP: (144-155)/(89-104) 151/89 mmHg (01/04 1437) SpO2:  [99 %-100 %] 100 % (01/04 1437) 4 liters  PHYSICAL EXAMINATION: General:  Chronically ill appearing, comfortable in chair HENT: NCAT PERRL, EOMI PULM: Poor air movement, minimal wheezing, normal effort CV: RRR, no mgr GI: BS+, soft, nontender MSK: diminished bulk, tone Neuro: A&Ox4, maew   Recent Labs Lab 01/13/15 1205 01/14/15 0432  NA 140 137  K 4.0 4.2  CL 100* 100*  CO2 30 27  BUN 15 13  CREATININE 1.17 0.99  GLUCOSE 114* 202*    Recent Labs Lab 01/13/15 1205 01/14/15 0432  HGB 12.9* 12.9*  HCT 40.5 40.7  WBC 8.1 7.1  PLT 278 301   No results found.  ASSESSMENT / PLAN:  Acute on chronic hypoxic respiratory failure in setting of CAP vs BTX flare in setting of severe fibro cavitary sarcoidosis.  Cocaine use contributing Improved again today but still has more wheezing than baseline, hasn't walked yet  Plan F/u Sputum culture  Change to prednisone tomorrow : 50mg  daily I'm going to stop the Vanc/Zosyn and start Levaquin today, he has done well with this in the past, would plan for 10 day course Scheduled BDs Flutter O2 as needed Discussed cessation of cocaine  Out of bed, walk tomorrow If walking tomorrow then likely home tomorrow afternoon  OSA Plan Auto-set CPAP at HS  HTN Plan Cont CCB   Simonne MartinetPeter E Babcock  ACNP-BC [facility] Pager # [phone] OR # [phone] if no answer   01/15/2015, 2:59 PM

## 2015-01-15 NOTE — Progress Notes (Signed)
Patient refused CPT by chest vest at this time 

## 2015-01-16 DIAGNOSIS — I10 Essential (primary) hypertension: Secondary | ICD-10-CM

## 2015-01-16 DIAGNOSIS — D869 Sarcoidosis, unspecified: Secondary | ICD-10-CM

## 2015-01-16 DIAGNOSIS — D649 Anemia, unspecified: Secondary | ICD-10-CM

## 2015-01-16 DIAGNOSIS — J9611 Chronic respiratory failure with hypoxia: Secondary | ICD-10-CM

## 2015-01-16 DIAGNOSIS — G4733 Obstructive sleep apnea (adult) (pediatric): Secondary | ICD-10-CM

## 2015-01-16 LAB — CBC
HEMATOCRIT: 36.2 % — AB (ref 39.0–52.0)
HEMOGLOBIN: 11.5 g/dL — AB (ref 13.0–17.0)
MCH: 27.9 pg (ref 26.0–34.0)
MCHC: 31.8 g/dL (ref 30.0–36.0)
MCV: 87.9 fL (ref 78.0–100.0)
Platelets: 300 10*3/uL (ref 150–400)
RBC: 4.12 MIL/uL — AB (ref 4.22–5.81)
RDW: 13.3 % (ref 11.5–15.5)
WBC: 11.2 10*3/uL — AB (ref 4.0–10.5)

## 2015-01-16 LAB — BASIC METABOLIC PANEL
ANION GAP: 8 (ref 5–15)
BUN: 14 mg/dL (ref 6–20)
CO2: 30 mmol/L (ref 22–32)
Calcium: 9 mg/dL (ref 8.9–10.3)
Chloride: 102 mmol/L (ref 101–111)
Creatinine, Ser: 0.91 mg/dL (ref 0.61–1.24)
GFR calc non Af Amer: >60 mL/min (ref >60)
GLUCOSE: 118 mg/dL — AB (ref 65–99)
POTASSIUM: 5 mmol/L (ref 3.5–5.1)
Sodium: 140 mmol/L (ref 135–145)

## 2015-01-16 LAB — CULTURE, RESPIRATORY W GRAM STAIN: Culture: NORMAL

## 2015-01-16 LAB — CULTURE, RESPIRATORY

## 2015-01-16 MED ORDER — LEVOFLOXACIN 750 MG PO TABS: 750.0000 mg | ORAL_TABLET | ORAL | Status: AC

## 2015-01-16 MED ORDER — PREDNISONE 20 MG PO TABS
40.0000 mg | ORAL_TABLET | Freq: Every day | ORAL | Status: DC
  Administered 2015-01-16: 40 mg via ORAL
  Filled 2015-01-16: qty 2

## 2015-01-16 MED ORDER — GUAIFENESIN ER 600 MG PO TB12: 600.0000 mg | ORAL_TABLET | Freq: Two times a day (BID) | ORAL | Status: DC

## 2015-01-16 MED ORDER — PREDNISONE 20 MG PO TABS: 20.0000 mg | ORAL_TABLET | Freq: Every day | ORAL | Status: DC

## 2015-01-16 NOTE — Progress Notes (Addendum)
   Name: [name] MRN: [id number] DOB: 09/17/1965    ADMISSION DATE:  01/13/2015 CONSULTATION DATE: 1/2  REFERRING MD :  XU  CHIEF COMPLAINT:  Acute on chronic respiratory failure   BRIEF PATIENT DESCRIPTION:   50 year old male f/b [place] w/ chronic respiratory failure in setting of fibrocavitary sarcoidosis. Admitted 1/2 w/ CAP vs BTX flare.   SIGNIFICANT EVENTS    STUDIES:    SUBJECTIVE:  Feeling better today   VITAL SIGNS: Temp:  [98 F (36.7 C)-98.8 F (37.1 C)] 98 F (36.7 C) (01/05 0505) Pulse Rate:  [89-110] 89 (01/05 0505) Resp:  [20] 20 (01/05 0505) BP: (129-151)/(82-90) 129/82 mmHg (01/05 0505) SpO2:  [97 %-100 %] 100 % (01/05 0505) 4 liters  PHYSICAL EXAMINATION: General:  Chronically ill appearing, comfortable in chair HENT: NCAT PERRL, EOMI PULM: clear, occ wheeze. No accessory muscle use CV: RRR, no mgr GI: BS+, soft, nontender MSK: diminished bulk, tone Neuro: A&Ox4, maew   Recent Labs Lab 01/13/15 1205 01/14/15 0432 01/16/15 0438  NA 140 137 140  K 4.0 4.2 5.0  CL 100* 100* 102  CO2 30 27 30   BUN 15 13 14   CREATININE 1.17 0.99 0.91  GLUCOSE 114* 202* 118*    Recent Labs Lab 01/13/15 1205 01/14/15 0432 01/16/15 0438  HGB 12.9* 12.9* 11.5*  HCT 40.5 40.7 36.2*  WBC 8.1 7.1 11.2*  PLT 278 301 300   No results found.  ASSESSMENT / PLAN:  Acute on chronic hypoxic respiratory failure in setting of CAP vs BTX flare in setting of severe fibro cavitary sarcoidosis.  Cocaine use contributing Improved again today; feels ready to go home. Culture data negative   Plan Change to prednisone: 40mg  daily--taper to off over 10d Levaquin at d/c 10 day course (total) Home on his reg BD regimen  Flutter O2 as needed Discussed cessation of cocaine  OK w/ dc from our stand-point  He has f/u with [place] on 1/12 at 315pm  OSA Plan Auto-set CPAP at HS  HTN Plan Cont CCB   Simonne MartinetPeter E Rion [name] ACNP-BC [facility] Pager #  [phone] OR # [phone] if no answer   01/16/2015, 9:45 AM

## 2015-01-16 NOTE — Discharge Summary (Addendum)
Physician Discharge Summary  [name] ZOX:[id number] DOB: Nov 08, 1965 DOA: 01/13/2015  PCP: Clydie Braun, MD  Admit date: 01/13/2015 Discharge date: 01/16/2015  Time spent: 35 minutes  Recommendations for Outpatient Follow-up:  1. Discharge home on prednisone taper over 12 days and oral Levaquin to complete total 10 days of antibiotics. 2. Follow-up with pulmonary as outpatient.   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 Patient admitted for severe healthcare associated pneumonia but his symptoms were likely due to acute bronchiectasis versus sarcoid flareup. Pulmonary consulted.  Hospital Course:  Acute on chronic hypoxic respiratory failure secondary to her throat associated pneumonia versus acute flareup of bronchiectasis. Treated with empiric vancomycin and Zosyn along with supportive care. Cultures negative. -Flu PCR and respiratory viral panel negative. Clinically improved and maintaining O2 sat on 2 L at present. -He will be discharged on prednisone taper over the next 12 days. We will also discharge him on oral Levaquin for 7 more days (to complete ten-day course of antibiotic). Has follow-up with  pulmonary as outpatient.  Flareup of sarcoidosis Continue steroids, antibiotics and bronchodilators. Follow-up with pulmonary as outpatient.  Obstructive sleep apnea Continue nighttime CPAP  Hypertension Cardizem and beta blocker was held due to cocaine use. Can be resumed upon discharge.  Polysubstance abuse Drug screen positive for amphetamines and cocaine. Counseled strongly on cessation.  Procedures:  None  Consultations:  Pulmonary  Discharge Exam: Filed Vitals:   01/15/15 2208 01/16/15 0505  BP: 140/90 129/82  Pulse: 101 89  Temp: 98.8 F (37.1 C) 98 F (36.7 C)  Resp: 20 20    General: Middle aged male not in distress HEENT: No pallor, moist mucosa Chest:Left basilar crackles CVS: Normal S1 and S2, no murmurs GI: Soft, nondistended, nontender Musculoskeletal: Warm, no edema CNS: Alert and oriented  Discharge Instructions    Current Discharge Medication List    START taking these medications   Details       levofloxacin (LEVAQUIN) 750 MG tablet Take 1 tablet (750 mg total) by mouth daily. Qty: 7 tablet, Refills: 0        CONTINUE these medications which have CHANGED   Details  predniSONE (DELTASONE) 20 MG tablet Take 1 tablet (20 mg total) by mouth daily with breakfast. Qty: 16 tablet, Refills: 0      CONTINUE these medications which have NOT CHANGED   Details  albuterol (PROVENTIL HFA;VENTOLIN HFA) 108 (90 BASE) MCG/ACT inhaler Inhale 2 puffs into the lungs every 6 (six) hours as needed for wheezing or shortness of breath. Qty: 1 Inhaler, Refills: 5    albuterol (PROVENTIL) (2.5 MG/3ML) 0.083% nebulizer solution Take 2.5 mg by nebulization every 4 (four) hours as needed for wheezing or shortness of breath (wheezing and shortness of breath).  bisacodyl (BISACODYL) 5 MG EC tablet Take 1 tablet (5 mg total) by mouth daily as needed for moderate constipation. Qty: 30 tablet, Refills: 0    bisoprolol (ZEBETA) 10 MG tablet Take 1 tablet (10  mg total) by mouth daily. Qty: 30 tablet, Refills: 0    budesonide-formoterol (SYMBICORT) 160-4.5 MCG/ACT inhaler Inhale 2 puffs into the lungs 2 (two) times daily. Qty: 1 Inhaler, Refills: 3   Associated Diagnoses: Sarcoid (HCC)    calcium-vitamin D (OSCAL WITH D) 500-200 MG-UNIT per tablet Take 1 tablet by mouth every morning.    cyclobenzaprine (FLEXERIL) 10 MG tablet Take 10 mg by mouth 3 (three) times daily as needed for muscle spasms (muscle spasms).     diltiazem (CARDIZEM CD) 120 MG 24 hr capsule Take 1 capsule (120 mg total) by mouth daily. Qty: 30 capsule, Refills: 3    diphenhydrAMINE (BENADRYL) 25 MG tablet Take 25 mg by mouth every 6 (six) hours as needed for allergies (allergies).     ferrous sulfate 325 (65 FE) MG tablet Take 1 tablet (325 mg total) by mouth 2 (two) times daily with a meal. Qty: 60 tablet, Refills: 0    fluticasone (FLONASE) 50 MCG/ACT nasal spray Place 2 sprays into both nostrils 2 (two) times daily. Qty: 16 g, Refills: 2    !! guaiFENesin (MUCINEX) 600 MG 12 hr tablet Take 1 tablet (600 mg total) by mouth 2 (two) times daily as needed (For congestion.). Qty: 60 tablet, Refills: 0    Multiple Vitamins-Minerals (MULTIVITAMIN WITH MINERALS) tablet Take 1 tablet by mouth every morning.     omeprazole (PRILOSEC) 40 MG capsule Take 40 mg by mouth daily.    ondansetron (ZOFRAN) 4 MG tablet Take 4 mg by mouth every 8 (eight) hours as needed for nausea or vomiting.     polyethylene glycol (MIRALAX / GLYCOLAX) packet Take 17 g by mouth daily. Qty: 14 each, Refills: 0        STOP taking these medications     famotidine (PEPCID) 20 MG tablet      ibuprofen (ADVIL,MOTRIN) 800 MG tablet      oxyCODONE-acetaminophen (PERCOCET) 7.5-325 MG per tablet        Allergies  Allergen Reactions  . Clindamycin/Lincomycin Anaphylaxis and Swelling    Made face and tongue swell   Follow-up Information    Follow up with PARRETT,TAMMY, NP On 01/23/2015.    Specialty:  Pulmonary Disease   Why:  315pm    Contact information:   [address] [place] [address] [phone]       Follow up with Clydie Braun, MD. Schedule an appointment as soon as possible for a visit in 3 weeks.   Specialty:  Internal Medicine   Contact information:   [address] [place] [address] [phone]        The results of significant diagnostics from this hospitalization (including imaging, microbiology, ancillary and laboratory) are listed below for reference.    Significant Diagnostic Studies: Dg Chest 2 View  01/13/2015  CLINICAL DATA:  Worsening shortness of breath and productive cough in a patient with sarcoidosis. EXAM: CHEST  2 VIEW COMPARISON:  10/02/2014 FINDINGS: Stable bilateral pleural-parenchymal opacity compatible with reported history of sarcoidosis. Areas of more focal wall progressive opacity are seen in the left mid lung and peripheral right lower lung. The cardiopericardial silhouette is within normal limits for size. The visualized bony structures of the thorax are intact. Telemetry leads overlie the chest.  IMPRESSION: Interval progression of bilateral ill-defined airspace opacities. Electronically Signed   By: Kennith Center M.D.   On: 01/13/2015 12:04    Microbiology: Recent Results (from the past 240 hour(s))  Blood culture (routine x 2)     Status: None (Preliminary result)   Collection Time: 01/13/15  1:50 PM  Result Value Ref Range Status   Specimen Description BLOOD RIGHT HAND  Final   Special Requests BOTTLES DRAWN AEROBIC AND ANAEROBIC 5CC  Final   Culture   Final    NO GROWTH 2 DAYS Performed at [facility]    Report Status PENDING  Incomplete  Blood culture (routine x 2)     Status: None (Preliminary result)   Collection Time: 01/13/15  2:07 PM  Result Value Ref Range Status   Specimen Description RIGHT ANTECUBITAL  Final   Special Requests BOTTLES DRAWN AEROBIC AND ANAEROBIC  5CC  Final   Culture   Final    NO GROWTH 2 DAYS Performed at [facility]    Report Status PENDING  Incomplete  Respiratory virus panel     Status: None   Collection Time: 01/13/15  2:49 PM  Result Value Ref Range Status   Source - RVPAN NOSE  Corrected   Respiratory Syncytial Virus A Negative Negative Final   Respiratory Syncytial Virus B Negative Negative Final   Influenza A Negative Negative Final   Influenza B Negative Negative Final   Parainfluenza 1 Negative Negative Final   Parainfluenza 2 Negative Negative Final   Parainfluenza 3 Negative Negative Final   Metapneumovirus Negative Negative Final   Rhinovirus Negative Negative Final   Adenovirus Negative Negative Final    Comment: (NOTE) Performed At: [facility] [address] [place], [place] [id number] Mila Homer MD UV:[id number]   MRSA PCR Screening     Status: None   Collection Time: 01/13/15  4:15 PM  Result Value Ref Range Status   MRSA by PCR NEGATIVE NEGATIVE Final    Comment:        The GeneXpert MRSA Assay (FDA approved for NASAL specimens only), is one component of a comprehensive MRSA colonization surveillance program. It is not intended to diagnose MRSA infection nor to guide or monitor treatment for MRSA infections.   Culture, expectorated sputum-assessment     Status: None   Collection Time: 01/13/15  6:20 PM  Result Value Ref Range Status   Specimen Description SPUTUM  Final   Special Requests NONE  Final   Sputum evaluation   Final    THIS SPECIMEN IS ACCEPTABLE. RESPIRATORY CULTURE REPORT TO FOLLOW.   Report Status 01/13/2015 FINAL  Final  Culture, respiratory (NON-Expectorated)     Status: None   Collection Time: 01/13/15  6:20 PM  Result Value Ref Range Status   Specimen Description SPUTUM  Final   Special Requests NONE  Final   Gram Stain   Final    MODERATE WBC PRESENT, PREDOMINANTLY PMN FEW SQUAMOUS EPITHELIAL CELLS PRESENT MODERATE GRAM POSITIVE COCCI IN  PAIRS IN CHAINS IN CLUSTERS MODERATE GRAM NEGATIVE COCCI RARE GRAM NEGATIVE RODS    Culture   Final    NORMAL OROPHARYNGEAL FLORA Performed at [facility]    Report Status 01/16/2015 FINAL  Final     Labs: Basic Metabolic Panel:  Recent Labs Lab 01/13/15 1205 01/14/15 0432 01/16/15 0438  NA 140 137 140  K 4.0 4.2 5.0  CL 100* 100* 102  CO2 30 27 30   GLUCOSE 114* 202*  118*  BUN 15 13 14   CREATININE 1.17 0.99 0.91  CALCIUM 9.4 9.5 9.0   Liver Function Tests:  Recent Labs Lab 01/13/15 1205 01/14/15 0432  AST 32 30  ALT 20 19  ALKPHOS 99 97  BILITOT 0.6 0.9  PROT 7.4 7.4  ALBUMIN 4.2 4.0   No results for input(s): LIPASE, AMYLASE in the last 168 hours. No results for input(s): AMMONIA in the last 168 hours. CBC:  Recent Labs Lab 01/13/15 1205 01/14/15 0432 01/16/15 0438  WBC 8.1 7.1 11.2*  HGB 12.9* 12.9* 11.5*  HCT 40.5 40.7 36.2*  MCV 86.4 86.8 87.9  PLT 278 301 300   Cardiac Enzymes: No results for input(s): CKTOTAL, CKMB, CKMBINDEX, TROPONINI in the last 168 hours. BNP: BNP (last 3 results)  Recent Labs  01/23/14 0510 01/25/14 1100 01/13/15 1205  BNP 127.1* 98.1 11.8    ProBNP (last 3 results) No results for input(s): PROBNP in the last 8760 hours.  CBG: No results for input(s): GLUCAP in the last 168 hours.     Signed:  Eddie NorthHUNGEL, Domnique Vantine MD   [facility] 01/16/2015, 10:20 AM

## 2015-01-18 LAB — CULTURE, BLOOD (ROUTINE X 2)
Culture: NO GROWTH
Culture: NO GROWTH

## 2015-01-23 ENCOUNTER — Ambulatory Visit: Payer: Commercial Managed Care - HMO | Admitting: Adult Health

## 2015-01-28 ENCOUNTER — Encounter: Payer: Self-pay | Admitting: Emergency Medicine

## 2015-01-28 ENCOUNTER — Ambulatory Visit (INDEPENDENT_AMBULATORY_CARE_PROVIDER_SITE_OTHER): Payer: Commercial Managed Care - HMO | Admitting: Emergency Medicine

## 2015-01-28 VITALS — BP 134/90 | HR 97 | Wt 178.0 lb

## 2015-01-28 DIAGNOSIS — Z9989 Dependence on other enabling machines and devices: Principal | ICD-10-CM

## 2015-01-28 DIAGNOSIS — J471 Bronchiectasis with (acute) exacerbation: Secondary | ICD-10-CM

## 2015-01-28 DIAGNOSIS — G4733 Obstructive sleep apnea (adult) (pediatric): Secondary | ICD-10-CM | POA: Diagnosis not present

## 2015-01-28 NOTE — Patient Instructions (Addendum)
Please continue your chest vest three times a day We will try to get your CPAP serviced or replaced by [facility].  Continue symbicort and albuterol as you are takling them  Continue oxygen at 3L/min.  We will consider starting alternating antibiotics at some point in the future if your bronchiectasis flares frequently.  Follow with Dr Delton Coombes in 3 months or sooner if you have any problems.

## 2015-01-28 NOTE — Progress Notes (Signed)
   Subjective:    Patient ID: [name], male    DOB: Aug 28, 1965, 50 y.o.   MRN: [id number]  HPI 50 yo male never smoker with sarcoidosis and bronchiectasis and chronic respiratory failure with chronic hypoxemia needing oxygen and sleep apnea needing CPAP   ROV 06/17/14 -- follow-up visit for chronic respiratory failure in the setting of sarcoidosis, bronchiectasis and obstructive sleep apnea. He has ups and downs with his exertional tolerance. He has thick mucous, some difficulty clearing secretions.  He is off prednisone. Taking mucinex bid. He uses flutter some days. Has used SABA more freq, mutliple times every day. Able to walk up stairs without stopping.   ROV 10/11/14 -- patient follows up for chronic respiratory failure, obstructive and restrictive disease with bronchiectasis due to sarcoidosis. He also has a history of sleep apnea. He frequently exacerbates and was just admitted to the hospital. At the time of discharge he was sent out with a chest vest to assist with mucus clearance. He is currently on Symbicort, albuterol when necessary. He is using the chest vest 3x a day and seems to be benefiting. He has finished levaquin. He is using albuterol about 3x a day, in association with his mucous clearance. His exertional tolerance is better than at the time of his hospitalization.  Needs Prevnar-13  ROV / Hosp f/u visit 01/28/15 -- follow-up visit for chronic respiratory failure, obstructive and restrictive disease with associated bronchiectasis due to sarcoidosis. He also has a history of sleep apnea. He uses crack cocaine. He was just admitted for an exacerbation of his bronchiectasis with possible pneumonia. He received IV antibiotics and was discharged on 01/16/15.  He continues to cough, has been using the vibra-vest with good effect, tid.  He is on symbicort, albuterol nebs on a schedule, alb HFA prn. His O2 is at 3L/min. He is sleeping with CPAP - [facility]  Review of Systems As per HPI     Objective:   Physical Exam  Filed Vitals:   01/28/15 1429  BP: 134/90  Pulse: 97  Weight: 178 lb (80.74 kg)  SpO2: 99%    GEN: A/Ox3; pleasant , NAD chronically ill appearing   HEENT:  OP clear, no erythema  NECK:  No stridor  RESP  Decreased BS in bases , no accessory muscle use  CARD:  RRR, no m/r/g  , no peripheral edema, pulses intact, no cyanosis or clubbing.  Musco: Warm bil, no deformities or joint swelling noted.   Neuro: alert, no focal deficits noted.    Skin: Warm, no lesions or rashes     Assessment & Plan:  OSA on CPAP Continue CPAP. He needs to have his device checked and potentially replaced by [facility]. We will make a referral for this  Bronchiectasis without acute exacerbation  Recent admission for acute exacerbation and possible CAP. He is clinically improved. He does seem to be benefiting significantly from the chest vest percussion. We'll continue this and continue his current bronchodilators. If he continues to flare frequently than he would probably benefit from alternating antibiotics.. I underscored the importance of stopping cocaine and he understands this. He is working hard to be abstinent.

## 2015-01-28 NOTE — Assessment & Plan Note (Signed)
Recent admission for acute exacerbation and possible CAP. He is clinically improved. He does seem to be benefiting significantly from the chest vest percussion. We'll continue this and continue his current bronchodilators. If he continues to flare frequently than he would probably benefit from alternating antibiotics.. I underscored the importance of stopping cocaine and he understands this. He is working hard to be abstinent.

## 2015-01-28 NOTE — Assessment & Plan Note (Signed)
Continue CPAP. He needs to have his device checked and potentially replaced by [facility]. We will make a referral for this

## 2015-02-03 IMAGING — CR DG CHEST 2V
2 series · 2 of 2 positions shown · non-contrast
Comparison: CT chest 10/19/2013 and chest radiographs 08/22/2013 in
[REDACTED]

CLINICAL DATA: Shortness of breath starting yesterday. History of
spontaneous pneumothorax last year. History of sarcoidosis.

EXAM:
CHEST  2 VIEW

[x chest ap]
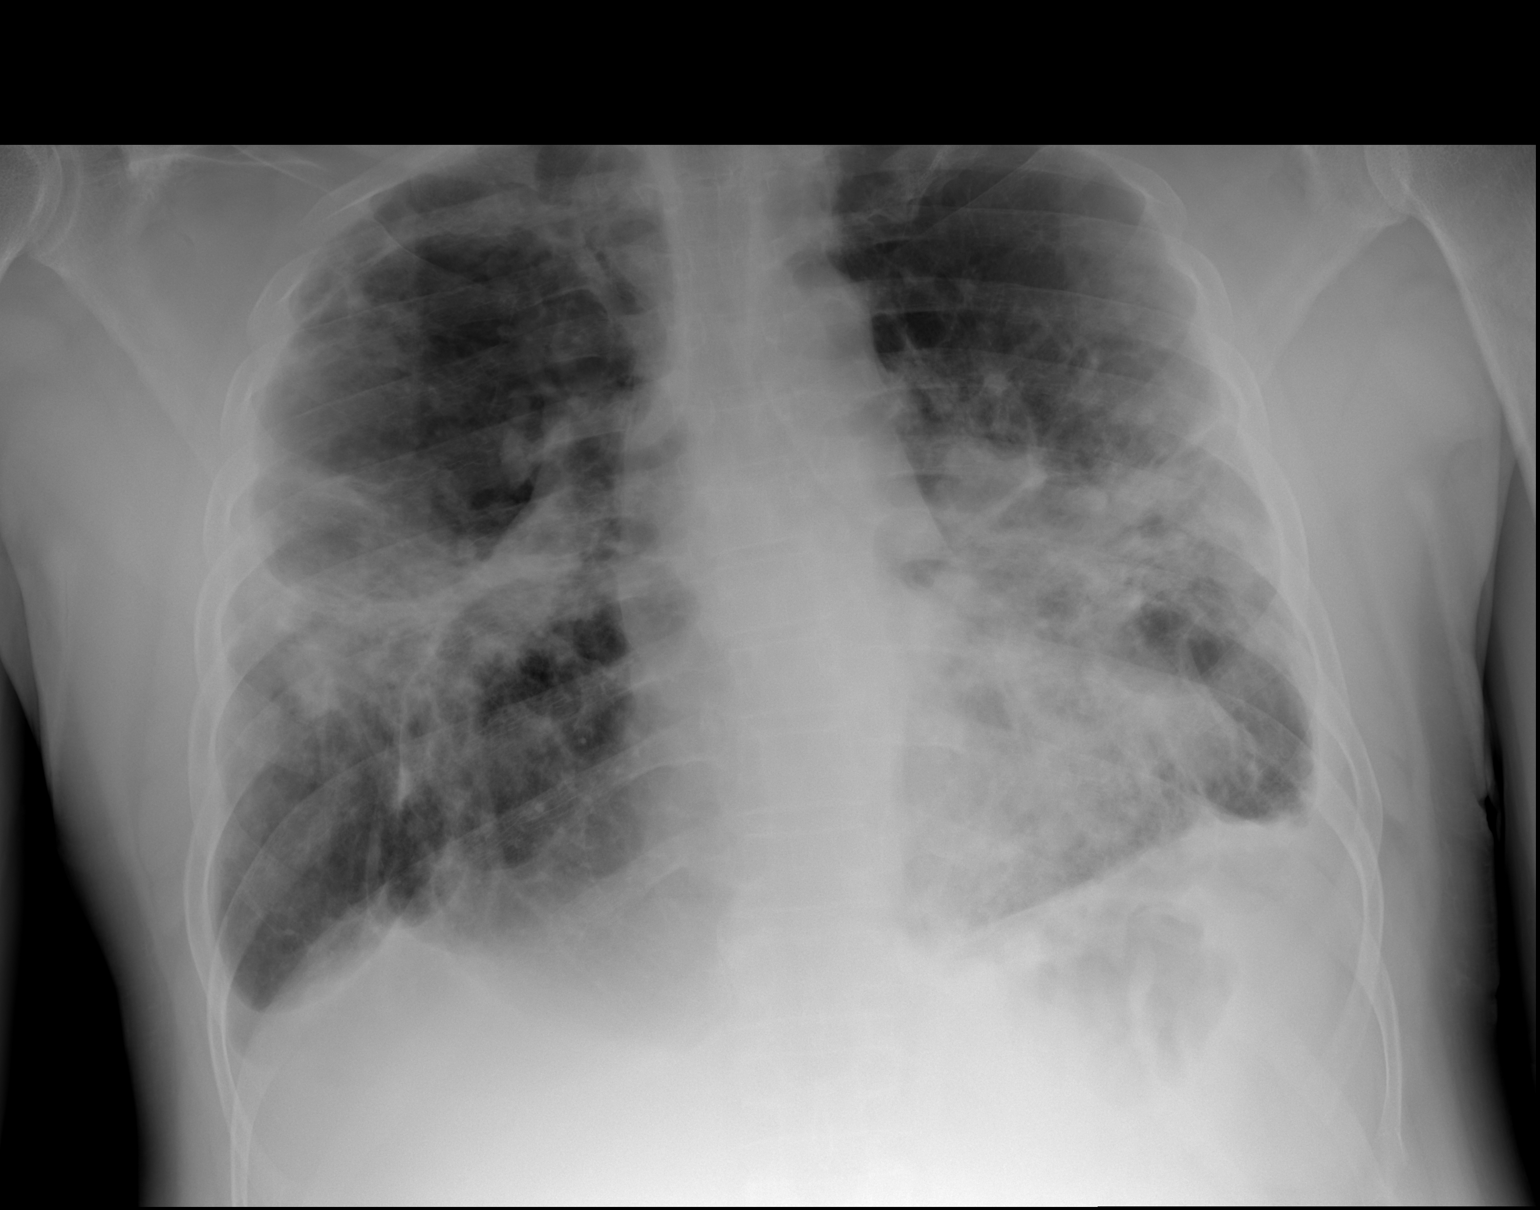

[w chest lat]
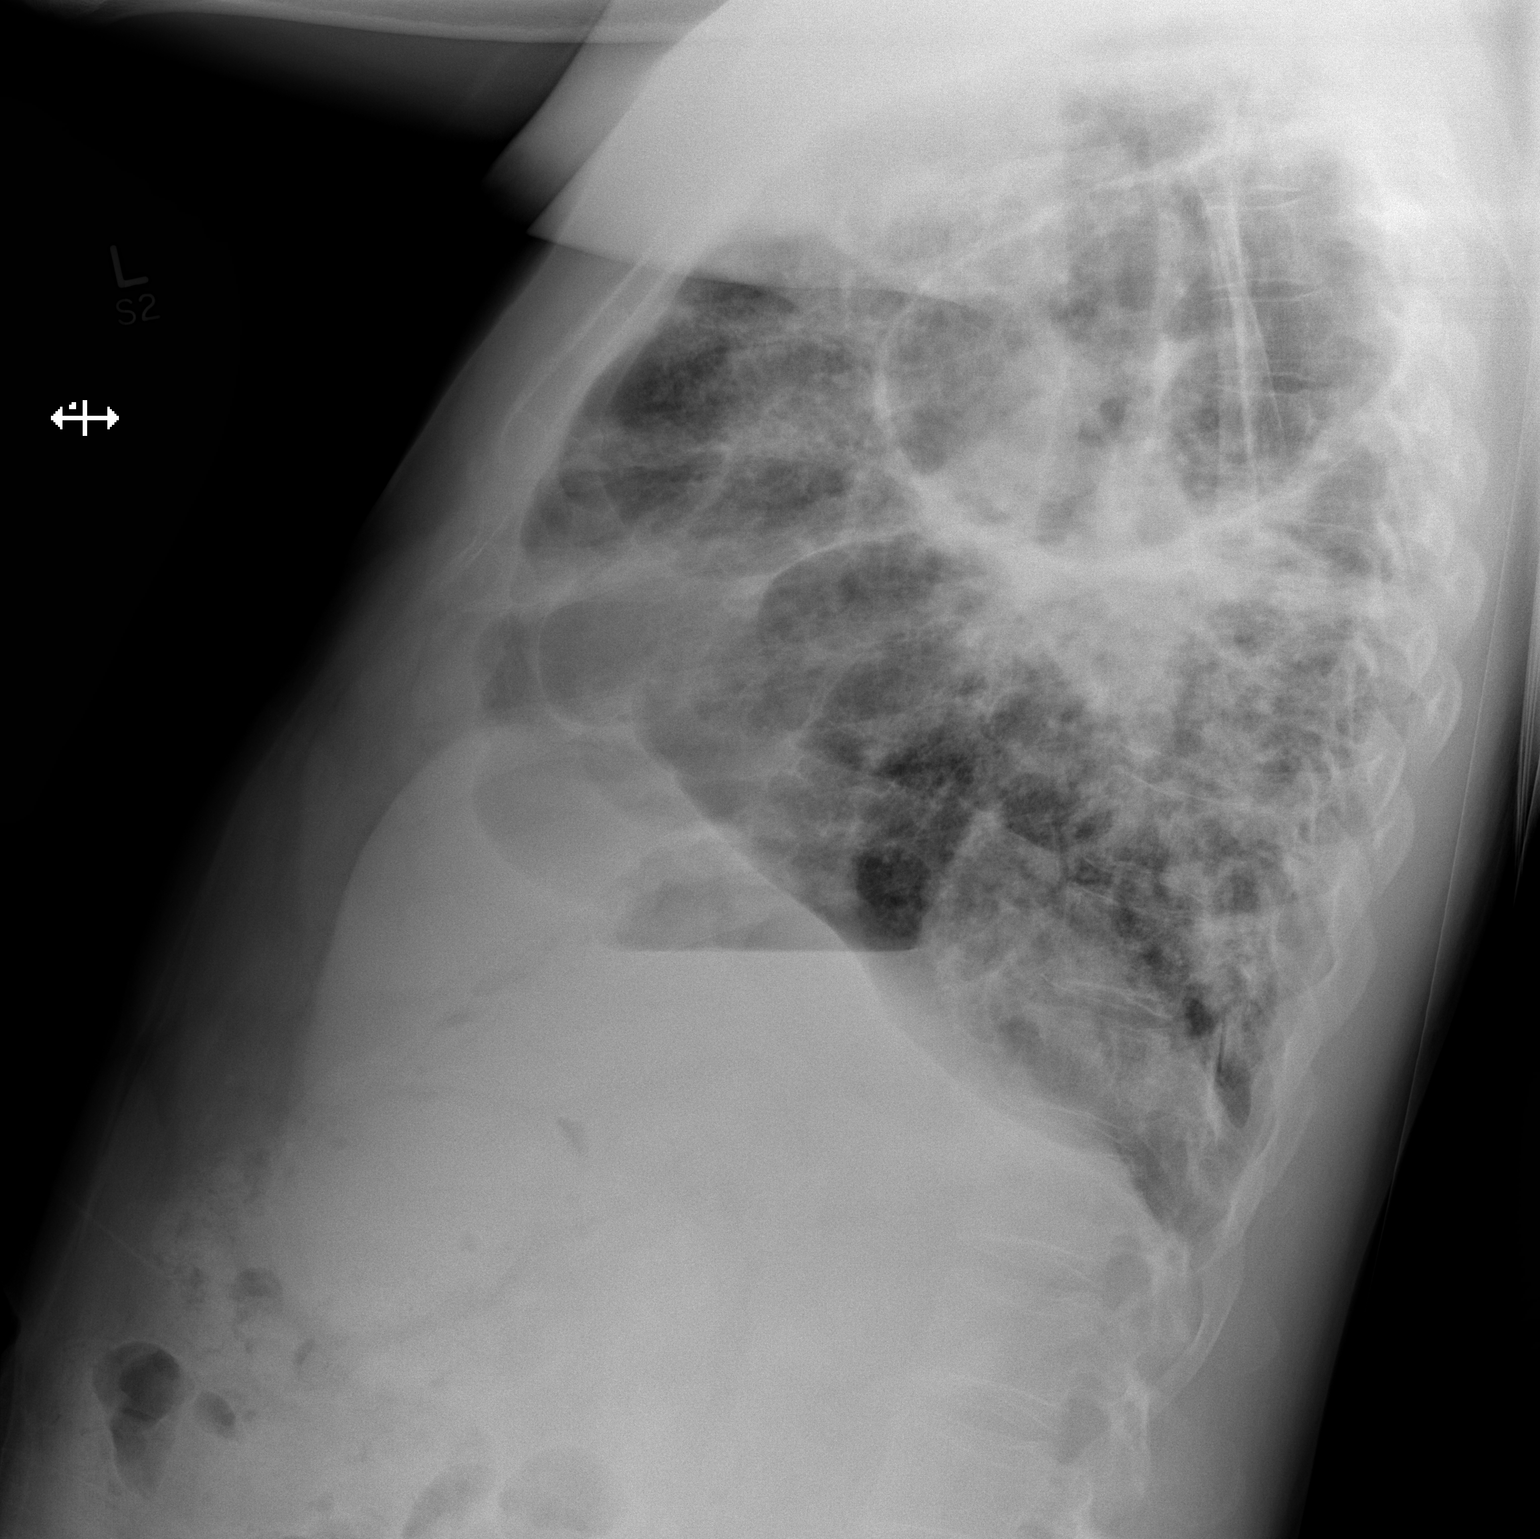

[2 of 2 positions shown; findings below may reference images not displayed]

FINDINGS: Patient has extensive perihilar parenchymal opacities hand extensive
bilateral parenchymal architectural distortion. These findings are
chronic and appear similar to the scout view for chest CT dated
10/19/2013 and chest radiographs dated 08/22/2013 and 02/18/2013.
Best seen on the lateral view is a chronic large bulla in the right
upper lobe apex. Bilateral interstitial prominence is stable. There
is thickening of the pleura at the lung bases bilaterally, also
chronic. No visible pneumothorax. Heart and mediastinal contours are
stable. No acute osseous abnormality is seen.
IMPRESSION: Stable marked chronic changes in the lungs secondary to known
history of sarcoidosis. No definite acute superimposed abnormality
is identified compared to patient's most recent comparison studies.

## 2015-02-05 IMAGING — DX DG CHEST 1V PORT
1 series · 1 of 1 positions shown · non-contrast
Comparison: Multiple prior studies, most recent chest radiograph is
11/23/2013.

CLINICAL DATA: Acute respiratory acidosis.  History sarcoidosis.

EXAM:
PORTABLE CHEST - 1 VIEW

[chest ap]
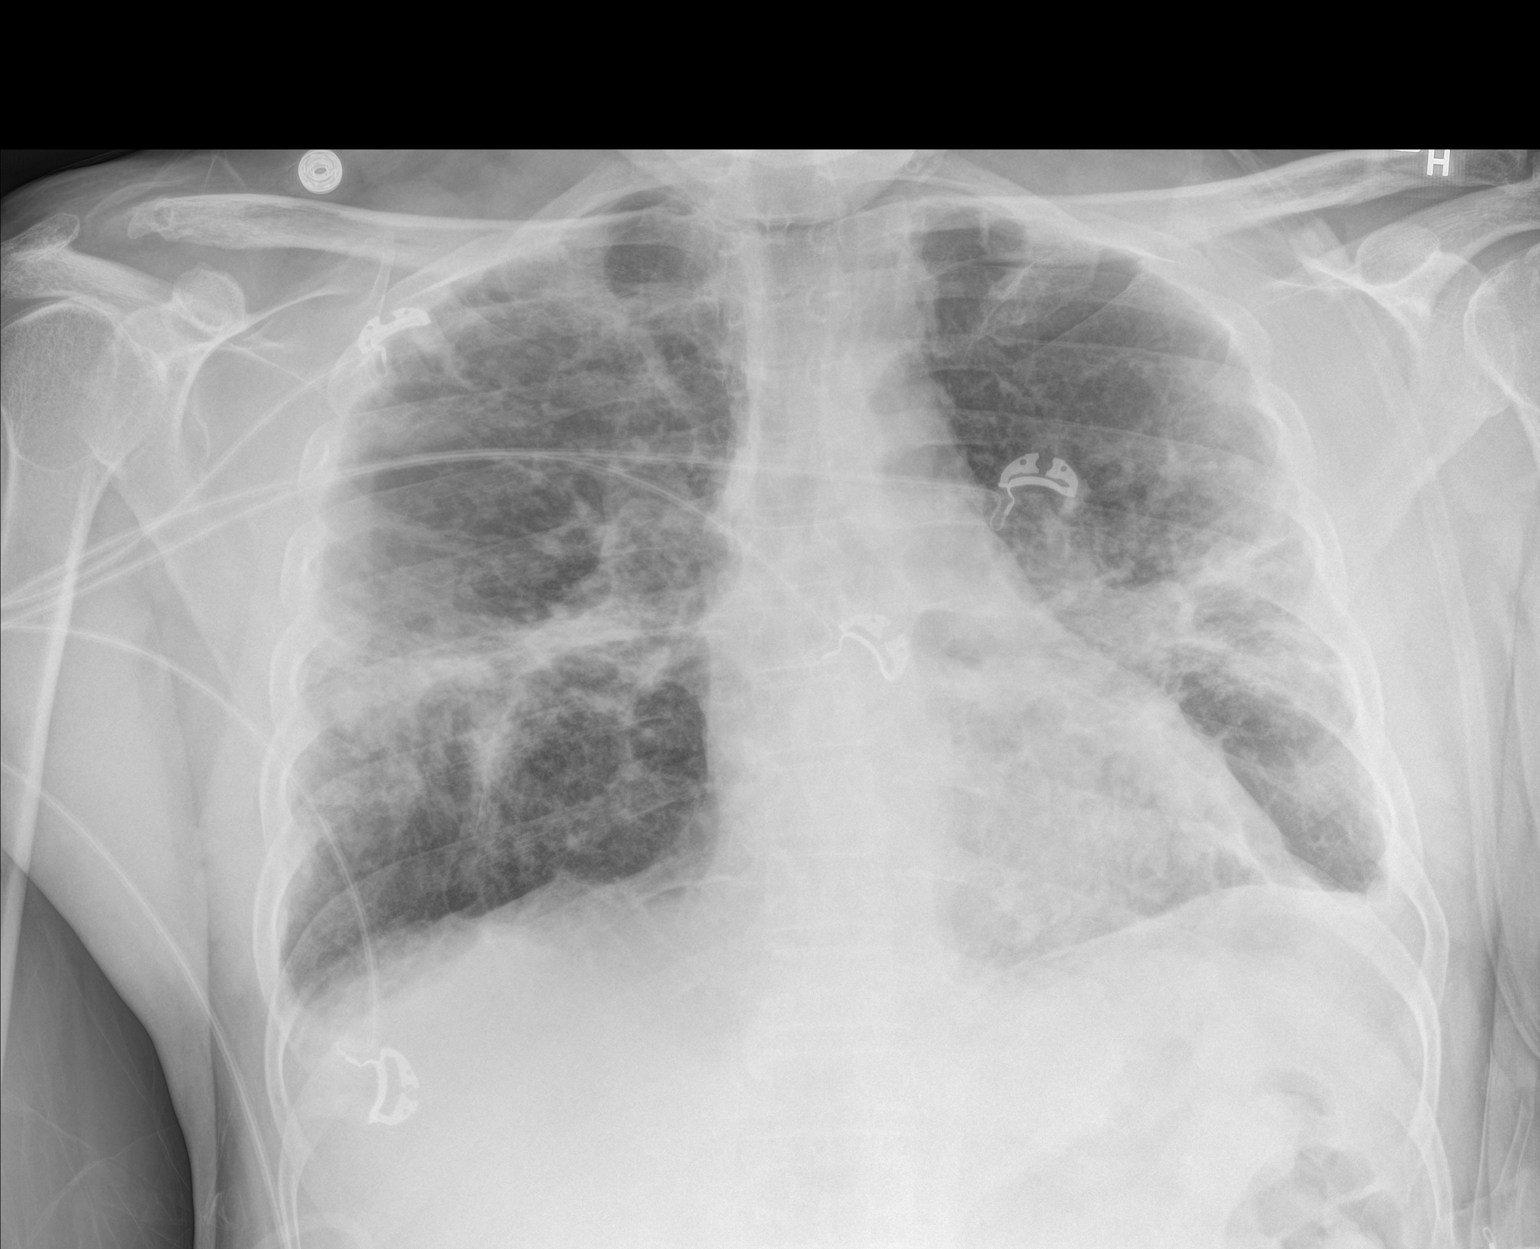

[1 of 1 positions shown; findings below may reference images not displayed]

FINDINGS: The heart size is within normal limits. Extensive chronic changes of
the lungs with marked architectural distortion, interstitial
prominence, and bilateral perihilar areas of consolidation are
without significant change compared to chest radiographs of
11/23/2013 and 08/22/2013. Chronic slight blunting of the
costophrenic angles. Negative for pneumothorax. No acute bony
abnormality.
IMPRESSION: Stable chronic changes consistent with advanced sarcoidosis given
patient history. No acute superimposed abnormality is identified.

## 2015-02-06 IMAGING — DX DG CHEST 1V PORT
1 series · 1 of 1 positions shown · non-contrast
Comparison: Portable chest x-ray yesterday. Two-view chest x-ray
11/23/2013, 08/31/2012. CT chest 10/19/2013.

CLINICAL DATA: Acute respiratory acidosis. Current history of
sarcoidosis and chronic respiratory failure requiring CPAP.

EXAM:
PORTABLE CHEST - 1 VIEW

[chest ap]
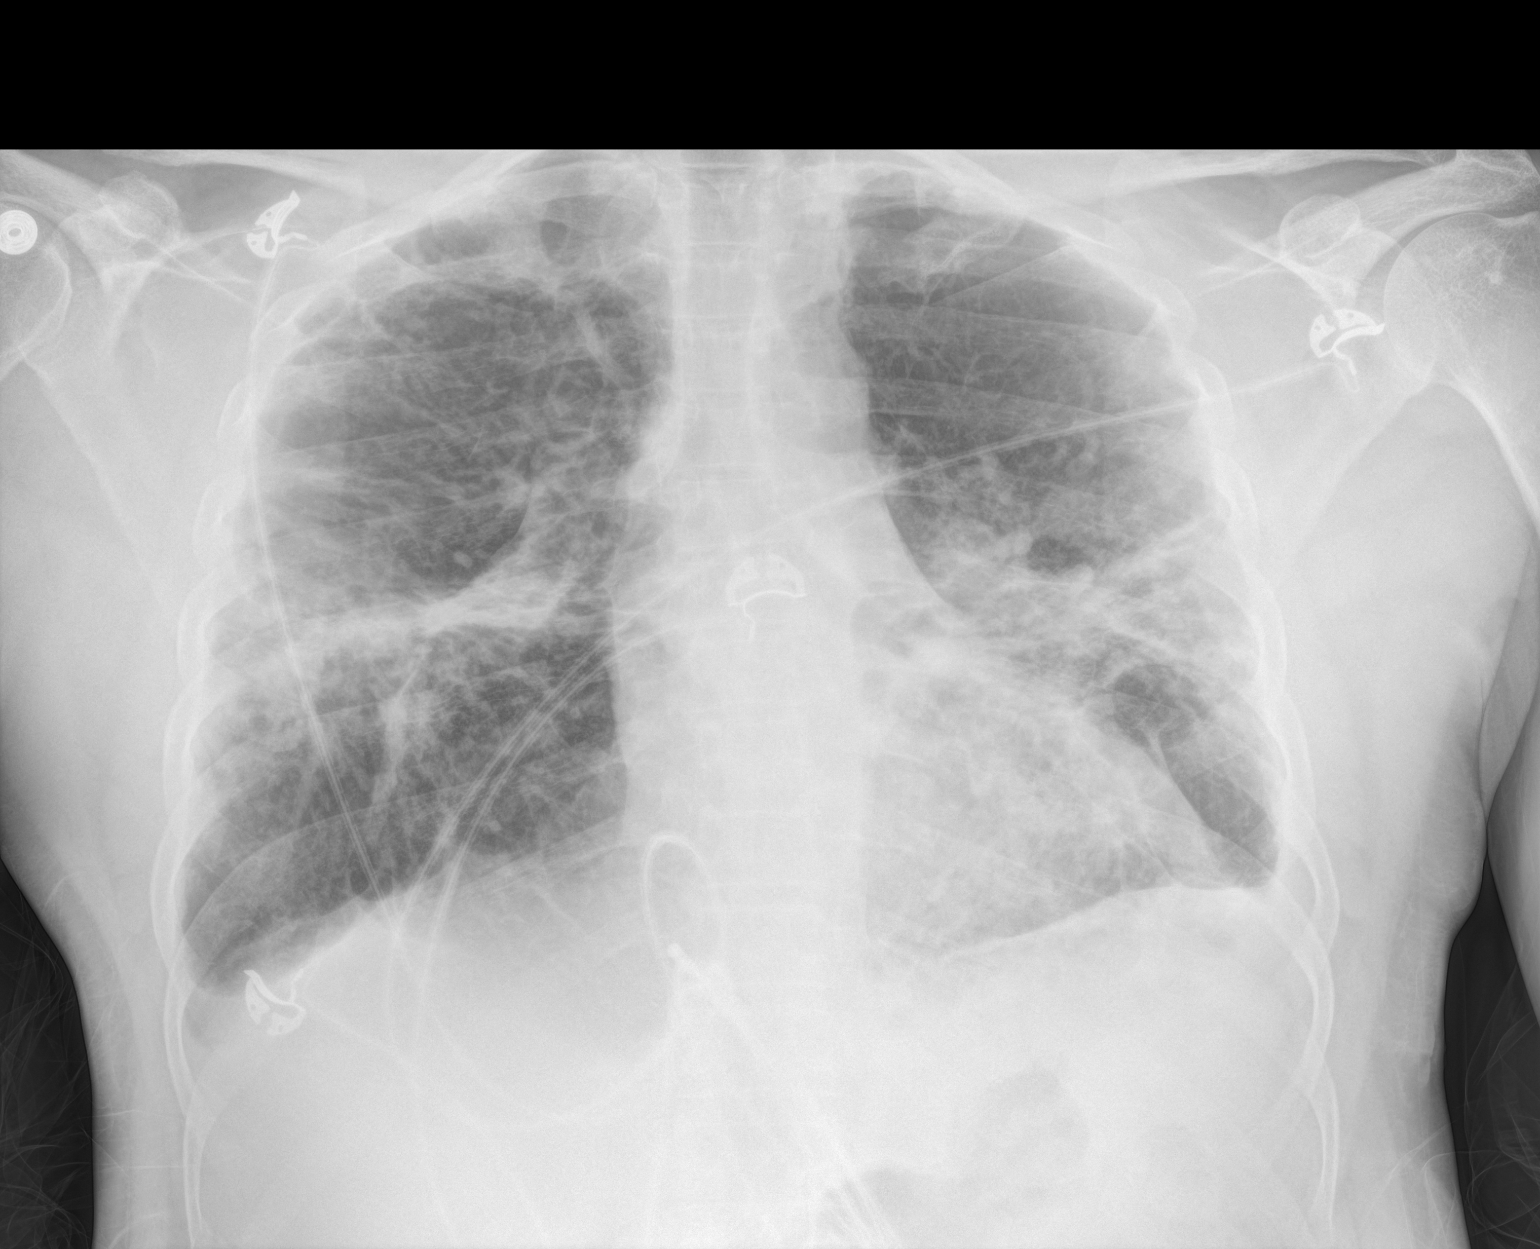

[1 of 1 positions shown; findings below may reference images not displayed]

FINDINGS: Cardiomediastinal silhouette unremarkable, unchanged. Conglomerate
scarring in both mid lungs, pleuroparenchymal scarring in the bases,
and biapical pleuroparenchymal scarring, unchanged. Macronodular
opacities in the lung bases, left greater than right, unchanged. No
new pulmonary parenchymal abnormalities.
IMPRESSION: Stable chronic changes of end-stage sarcoidosis. No new/acute
cardiopulmonary disease.

## 2015-03-22 IMAGING — CR DG CHEST 1V PORT
1 series · 1 of 1 positions shown · non-contrast
Comparison: 11/26/2013, 08/22/2013, 08/31/2012.

CLINICAL DATA: Shortness of breath.  History sarcoidosis.

EXAM:
PORTABLE CHEST - 1 VIEW

[AP]
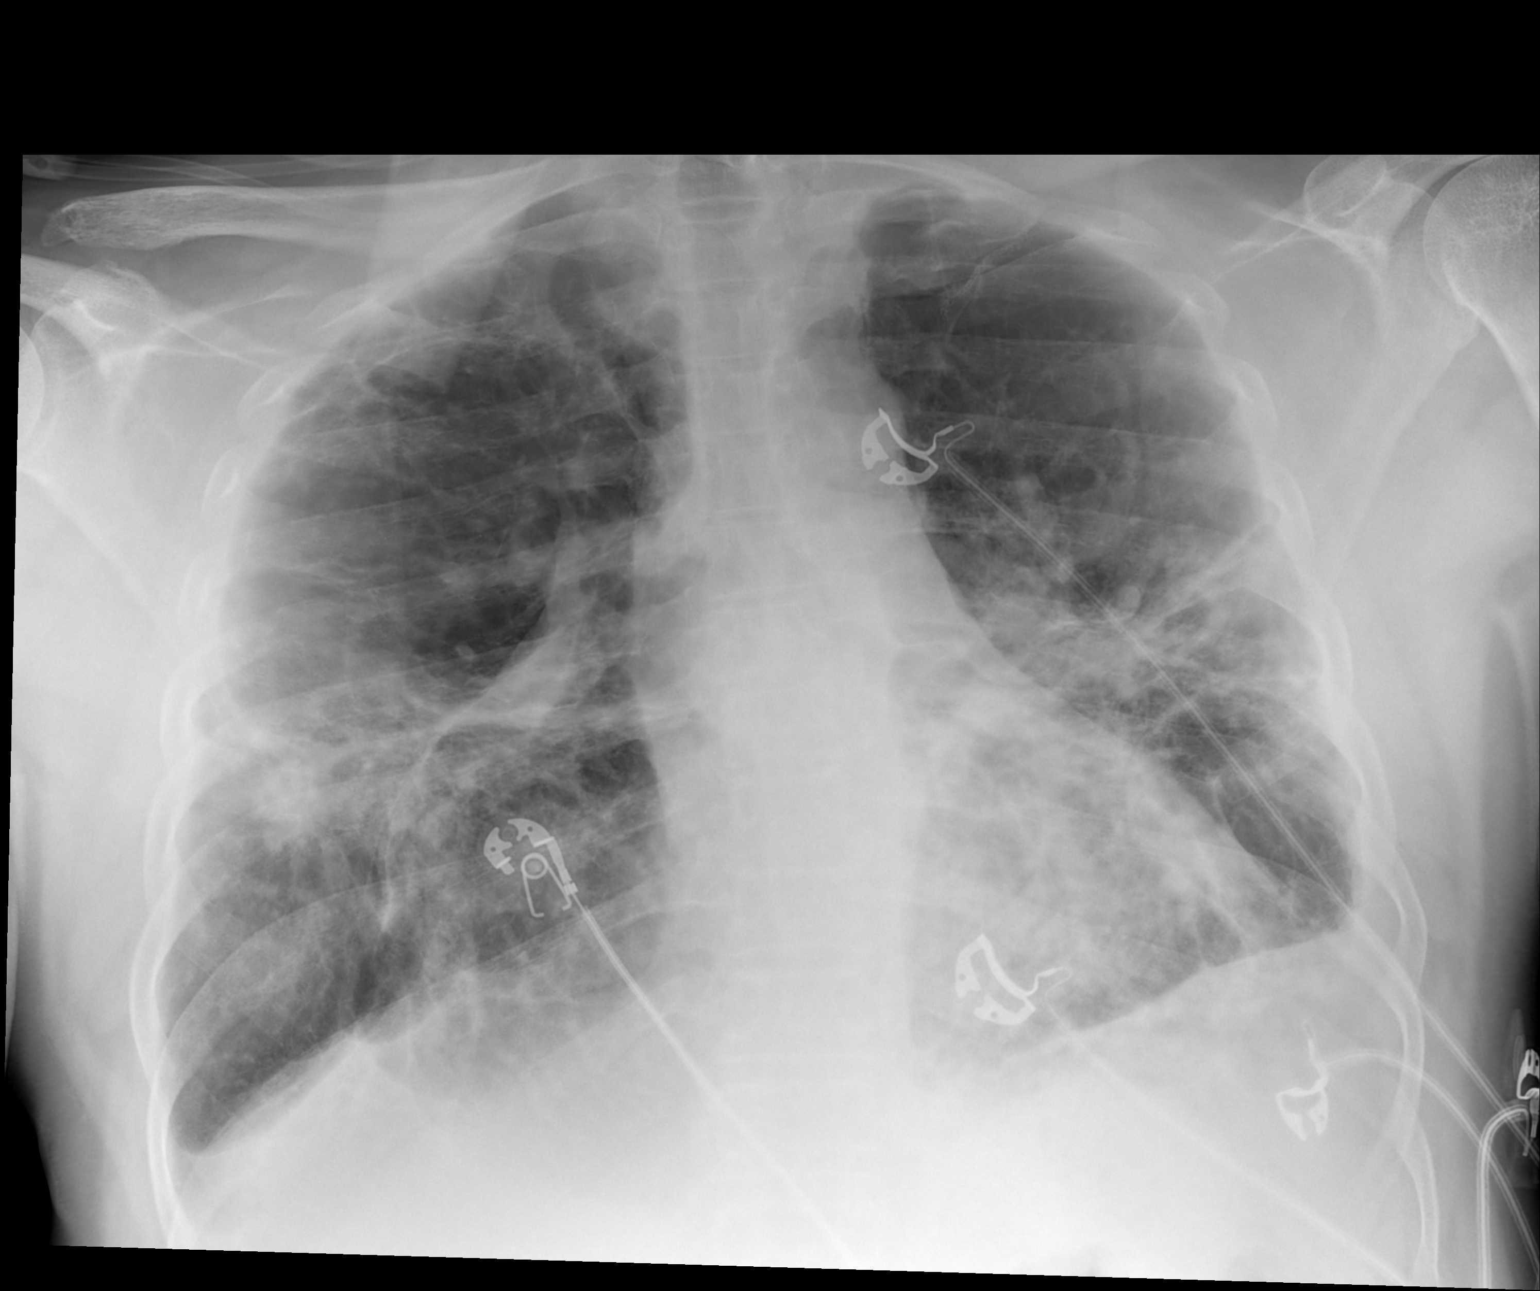

[1 of 1 positions shown; findings below may reference images not displayed]

FINDINGS: Mediastinum and hilar structures are unremarkable. Bilateral pleural
parenchymal thickening again noted. No change. Findings consistent
with scarring. No acute infiltrate. Heart size stable. No pleural
effusion or pneumothorax. No acute bony abnormality.
IMPRESSION: Stable severe bilateral pleural parenchymal scarring. These changes
may be related to patient's known sarcoidosis.

## 2015-03-23 IMAGING — CR DG CHEST 1V PORT
1 series · 1 of 1 positions shown · non-contrast
Comparison: 01/09/2014 and prior studies

CLINICAL DATA: Respiratory failure followup evaluation

EXAM:
PORTABLE CHEST - 1 VIEW

[AP]
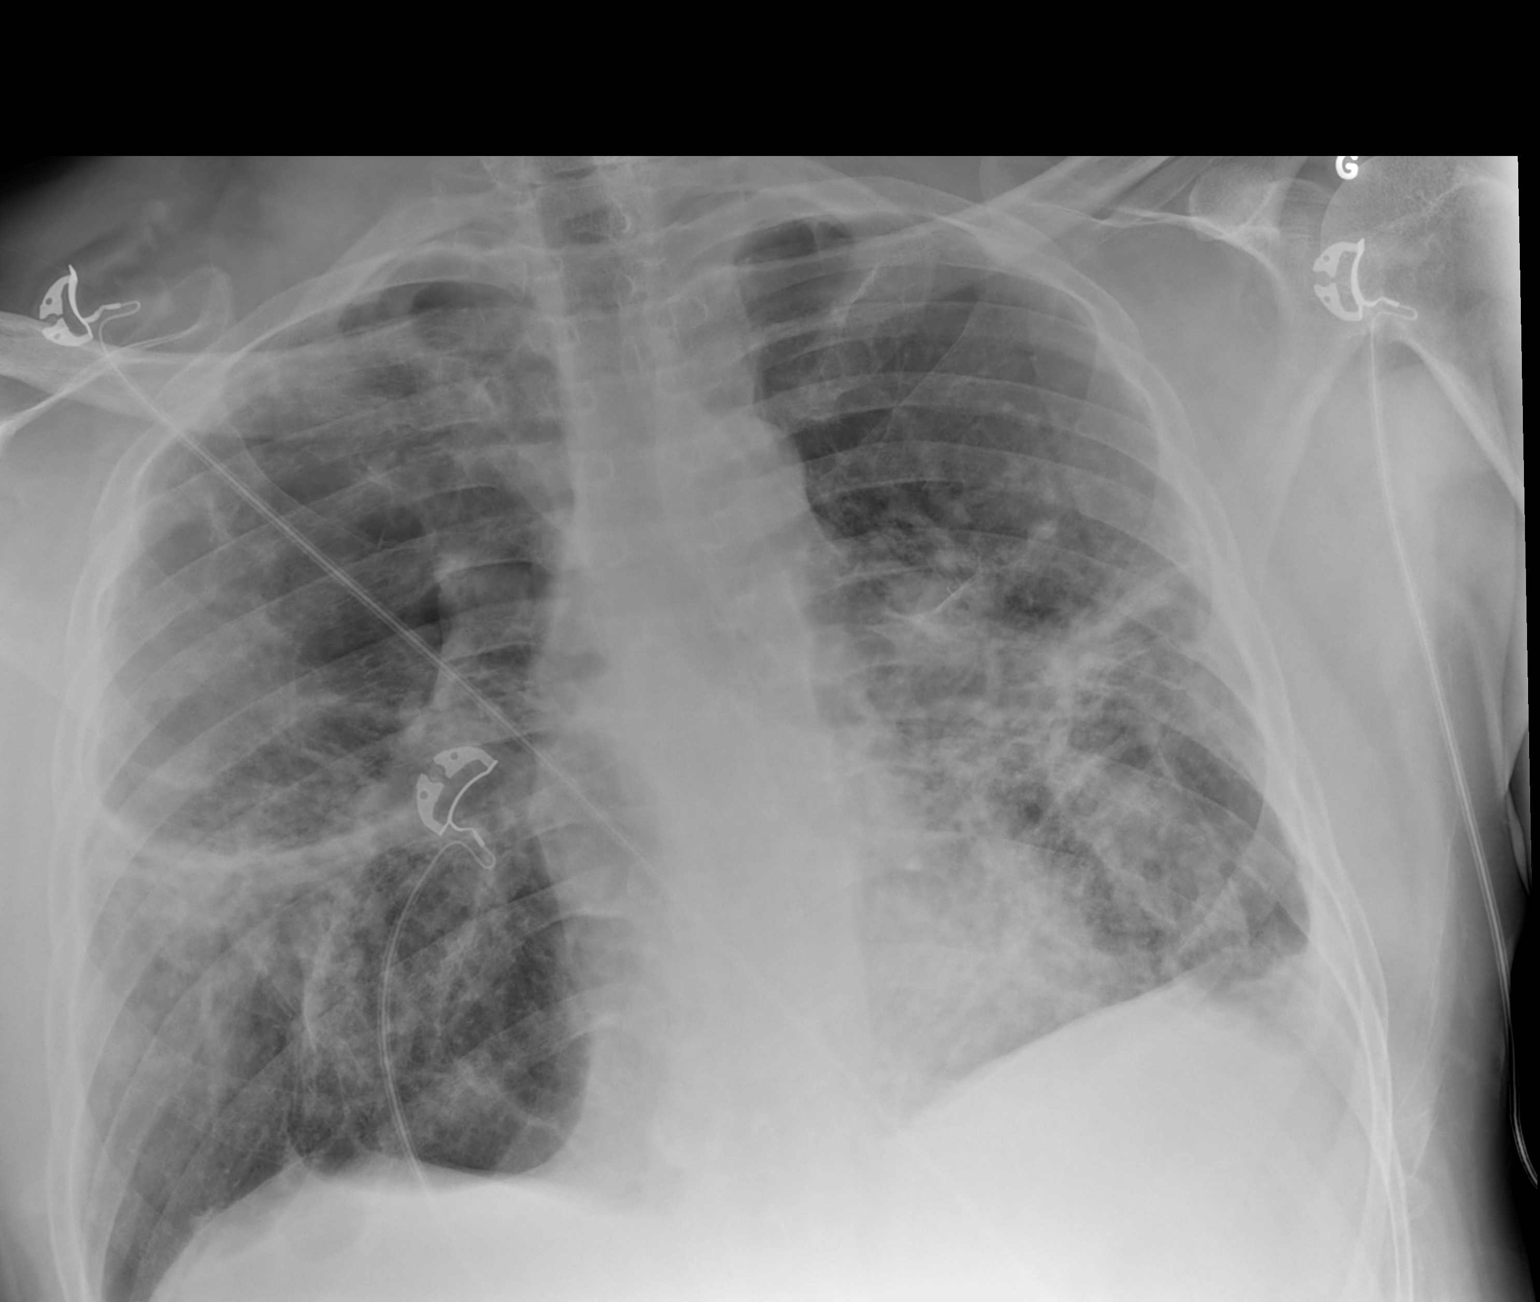

[1 of 1 positions shown; findings below may reference images not displayed]

FINDINGS: The heart size and vascular pattern are normal. There is
hyperinflation. There is opacity throughout the left mid to lower
lung zone. There is also opacity in the right mid lung zone. There
is rounded 2.5 cm opacity laterally in the right middle lobe. There
is also a left hilar prominence. These findings are unchanged from
11/25/2013.
IMPRESSION: Stable chronic bilateral opacities likely related to patient's known
end-stage sarcoidosis.

## 2015-03-25 ENCOUNTER — Telehealth: Payer: Self-pay | Admitting: Emergency Medicine

## 2015-03-25 IMAGING — DX DG CHEST 1V PORT
1 series · 1 of 1 positions shown · non-contrast
Comparison: 01/10/2014.  02/28/2013.

CLINICAL DATA: Respiratory failure.  Short of breath.

EXAM:
PORTABLE CHEST - 1 VIEW

[chest ap]
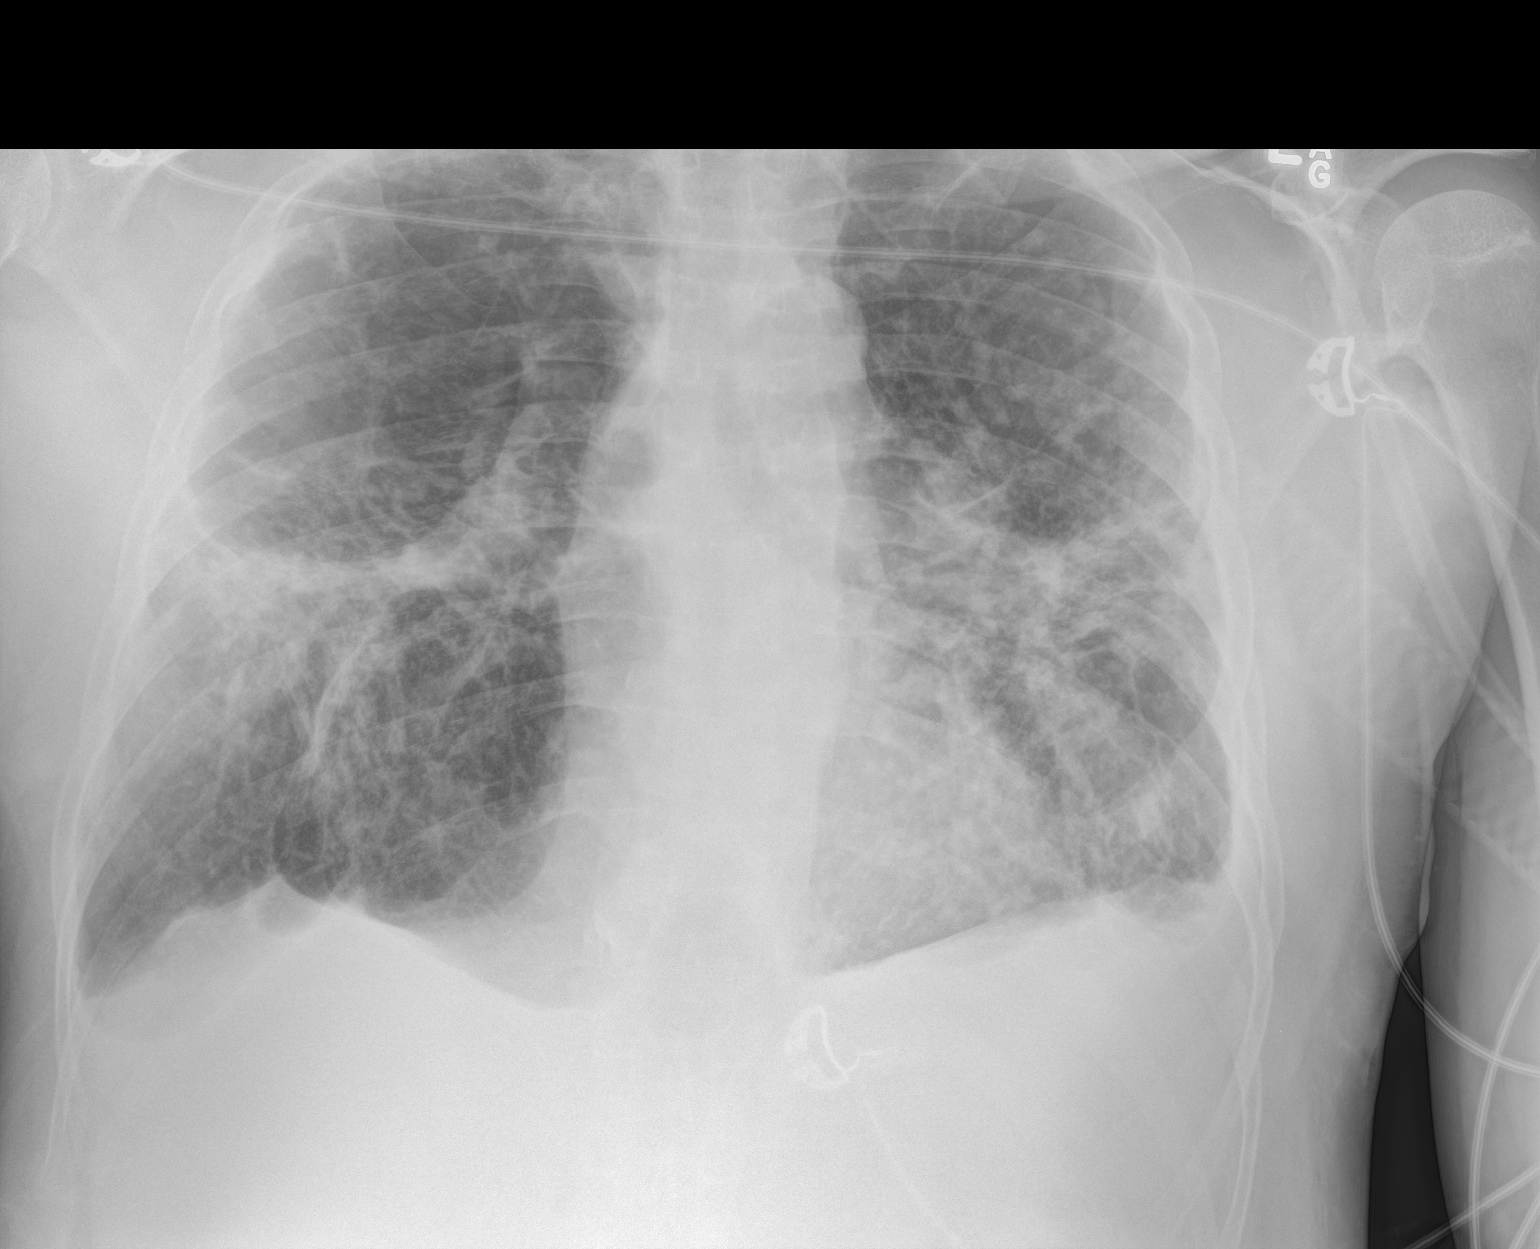

[1 of 1 positions shown; findings below may reference images not displayed]

FINDINGS: Cardiopericardial silhouette unchanged. Unchanged pulmonary
parenchymal distortion, compatible with sarcoidosis. Bilateral
apical bullous disease. Blunting of both costophrenic angles, with
the appearance of the RIGHT more prominent than previously. There
are probably bilateral pleural effusions which are small, with
superimposed chronic blunting of the costophrenic angles.
IMPRESSION: Chronic pulmonary parenchymal distortion scarring associated with
sarcoidosis. Probable small bilateral pleural effusions.

## 2015-03-25 MED ORDER — LEVOFLOXACIN 500 MG PO TABS: 500.0000 mg | ORAL_TABLET | Freq: Every day | ORAL | Status: DC

## 2015-03-25 MED ORDER — PREDNISONE 10 MG PO TABS: ORAL_TABLET | ORAL | Status: DC

## 2015-03-25 NOTE — Telephone Encounter (Signed)
Ok to start Pred taper > Take 40mg  daily for 3 days, then 30mg  daily for 3 days, then 20mg  daily for 3 days, then 10mg  daily for 3 days, then stop levaquin 500mg  qd x 7 days

## 2015-03-25 NOTE — Telephone Encounter (Signed)
Patient is calling to get prescription for antibiotics and prednisone.  Patient having trouble with SOB, chest congestion, drainage.  Patient says that he thinks the weather is causing it.  He says that he wants to go ahead and get the medications before he gets worse.  [facility] [place]  Allergies  Allergen Reactions  . Clindamycin/Lincomycin Anaphylaxis and Swelling    Made face and tongue swell

## 2015-03-25 NOTE — Telephone Encounter (Signed)
Rx sent to pharmacy. Patient aware. Nothing further needed.  

## 2015-03-27 ENCOUNTER — Other Ambulatory Visit: Payer: Self-pay | Admitting: Emergency Medicine

## 2015-03-28 IMAGING — DX DG ABD PORTABLE 1V
1 series · 1 of 1 positions shown · non-contrast
Comparison: CT pelvis 02/28/2012.

CLINICAL DATA: Right upper quadrant abdominal pain and abdominal
bloating that began earlier today.

EXAM:
PORTABLE ABDOMEN - 1 VIEW

[abdomen kub]
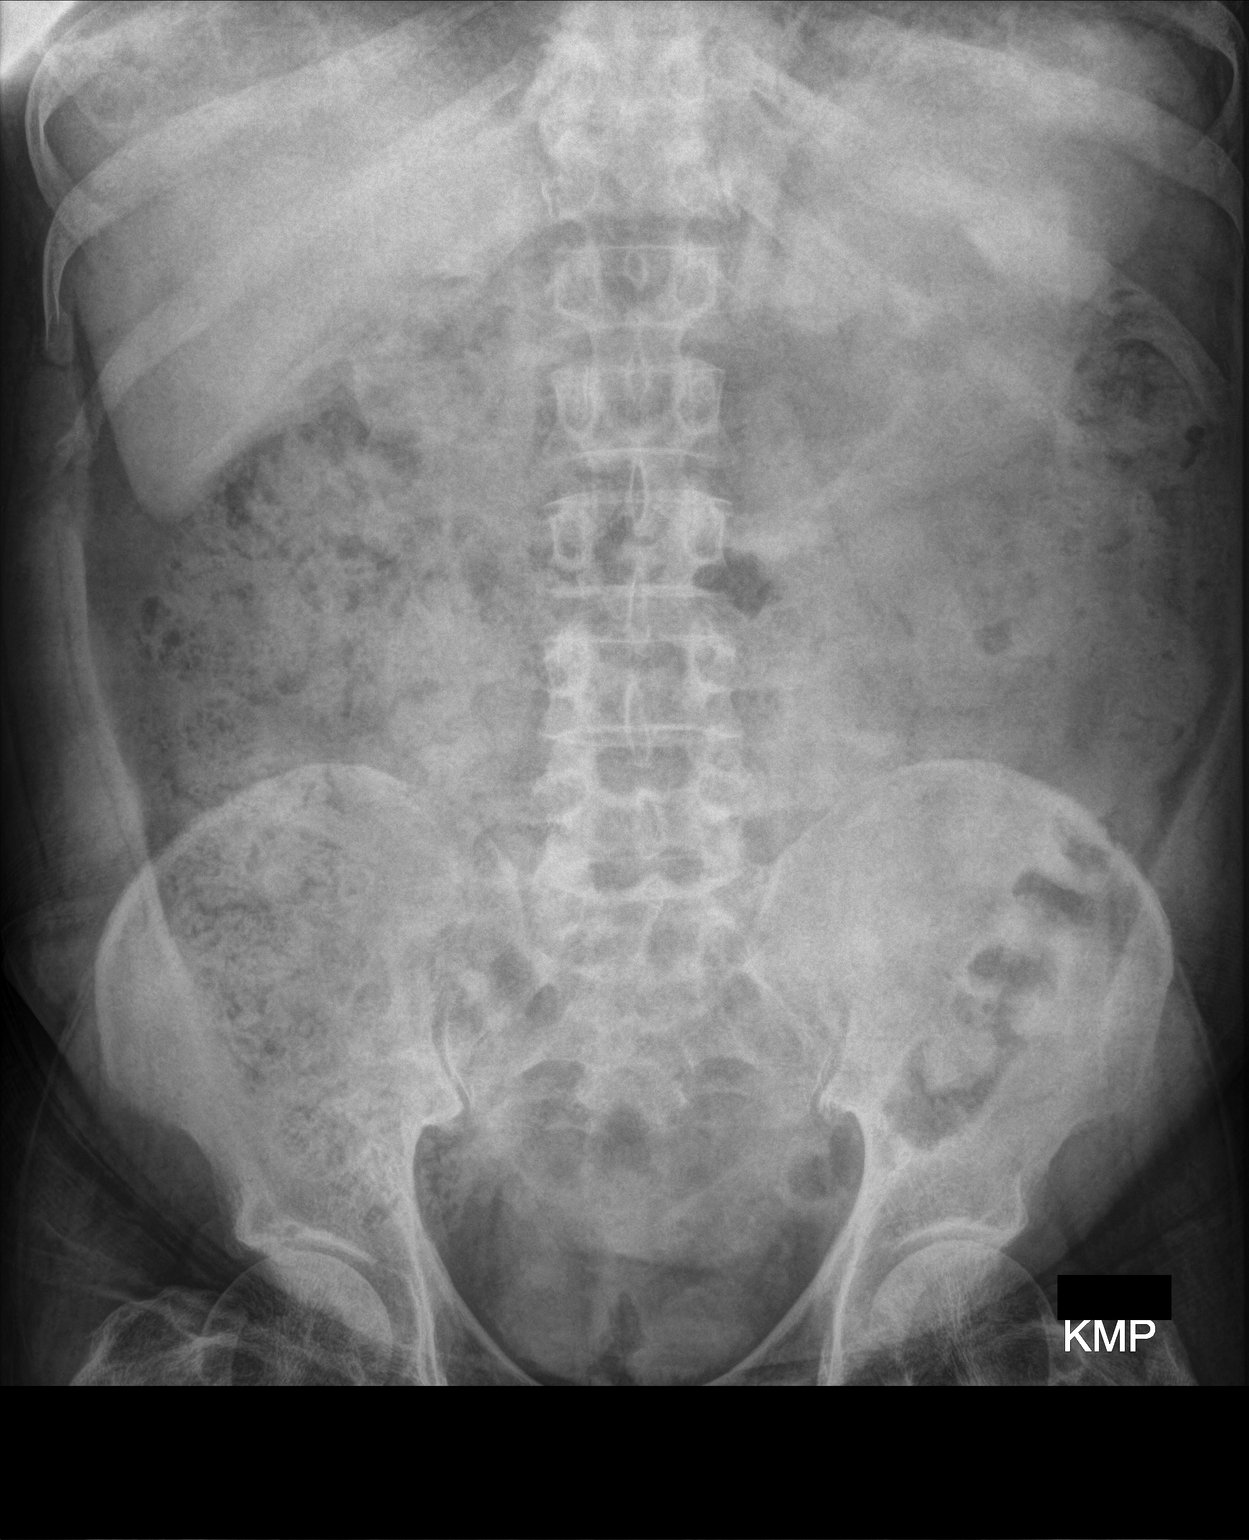

[1 of 1 positions shown; findings below may reference images not displayed]

FINDINGS: Bowel gas pattern unremarkable without evidence of obstruction or
significant ileus. Very large stool burden in the colon. Phleboliths
low in the right side of the pelvis. No visible opaque urinary tract
calculi. Regional skeleton intact.
IMPRESSION: No acute abdominal abnormality.  Very large stool burden.

## 2015-03-30 IMAGING — CR DG CHEST 1V PORT
1 series · 1 of 1 positions shown · non-contrast
Comparison: Portable chest x-rays 01/12/2014 dating back to
08/02/2012. Two-view chest x-ray 02/28/2013, 08/31/2012.

CLINICAL DATA: Severe shortness of breath, cough and wheezing which
began yesterday. Current history of COPD and sarcoidosis.

EXAM:
PORTABLE CHEST - 1 VIEW

[AP]
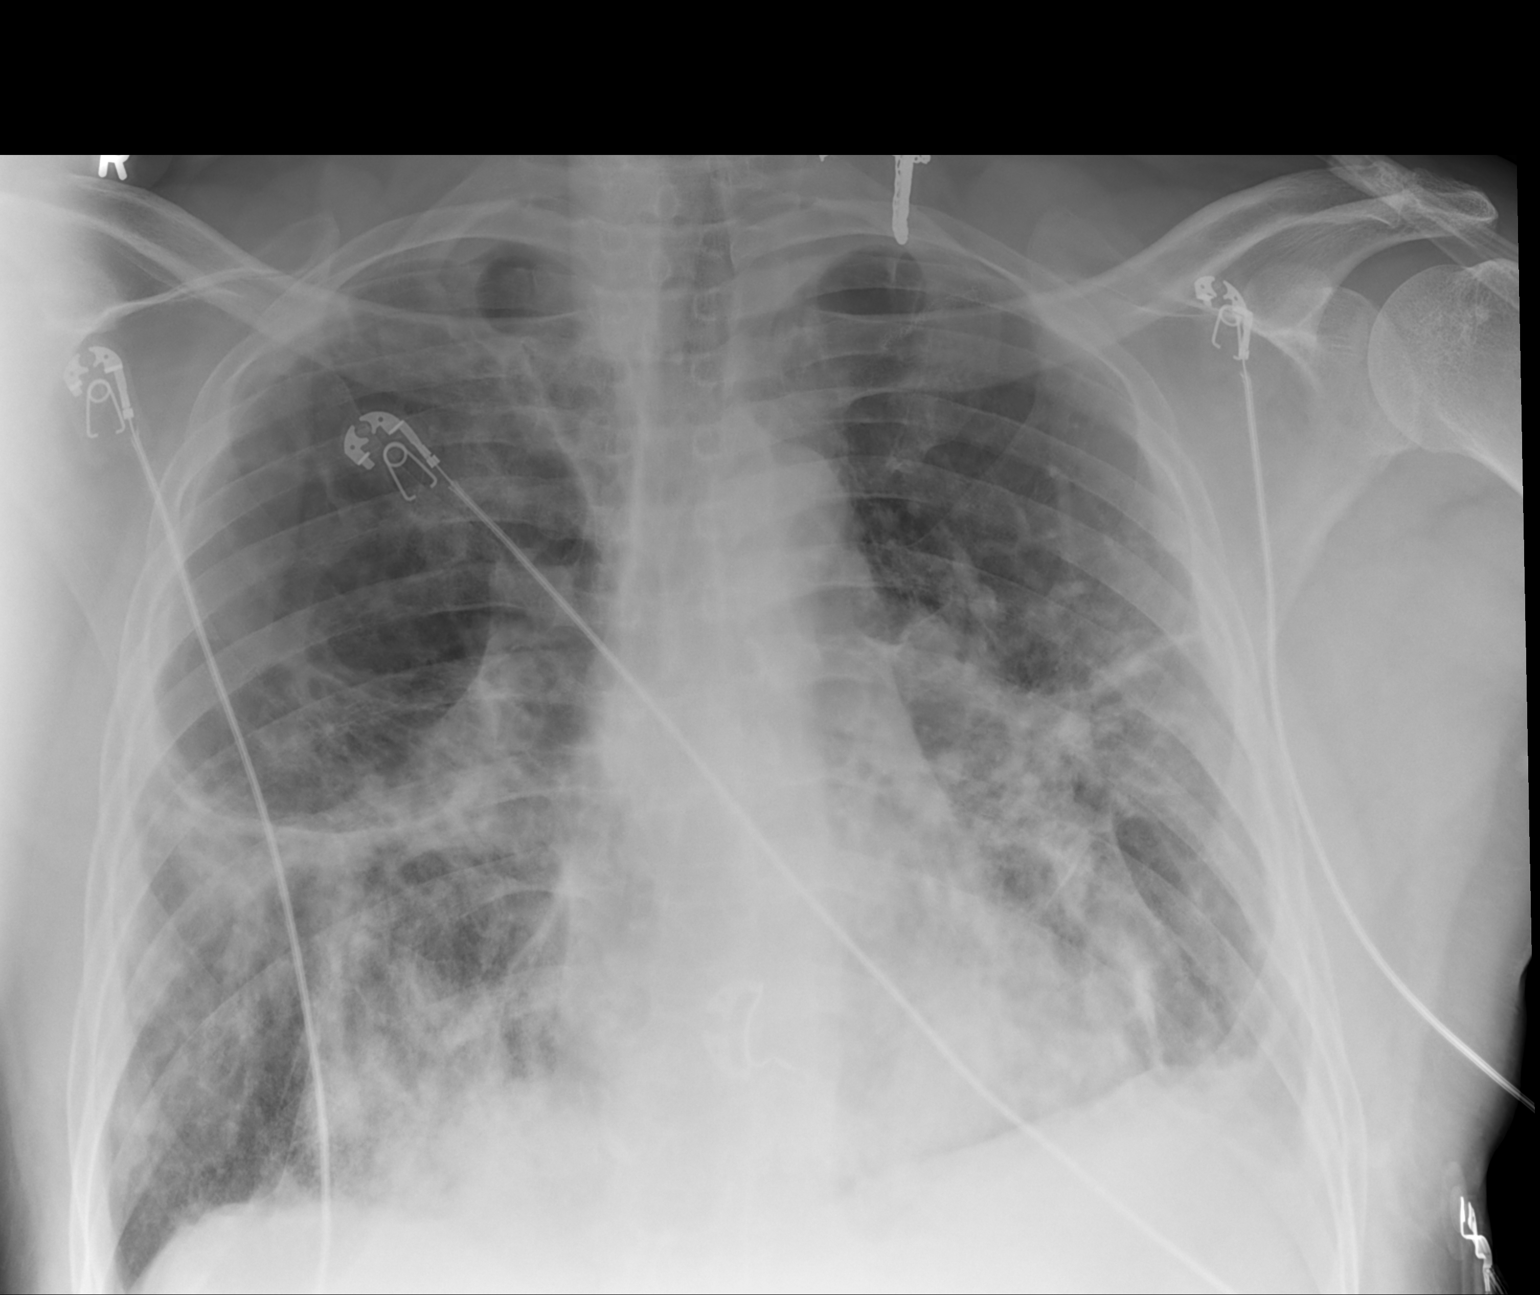

[1 of 1 positions shown; findings below may reference images not displayed]

FINDINGS: Cardiomediastinal silhouette unremarkable, unchanged. Conglomerate
scarring throughout both lungs as noted previously, consistent with
end-stage sarcoidosis. New focal airspace consolidation medially at
the right lung base. No new pulmonary parenchymal abnormalities
elsewhere.
IMPRESSION: Acute pneumonia involving the right lung base, superimposed upon
chronic end-stage sarcoidosis with conglomerate scarring throughout
both lungs.

## 2015-03-31 IMAGING — DX DG CHEST 1V PORT
1 series · 1 of 1 positions shown · non-contrast
Comparison: 01/17/2014

CLINICAL DATA: Pneumonia

EXAM:
PORTABLE CHEST - 1 VIEW

[chest ap]
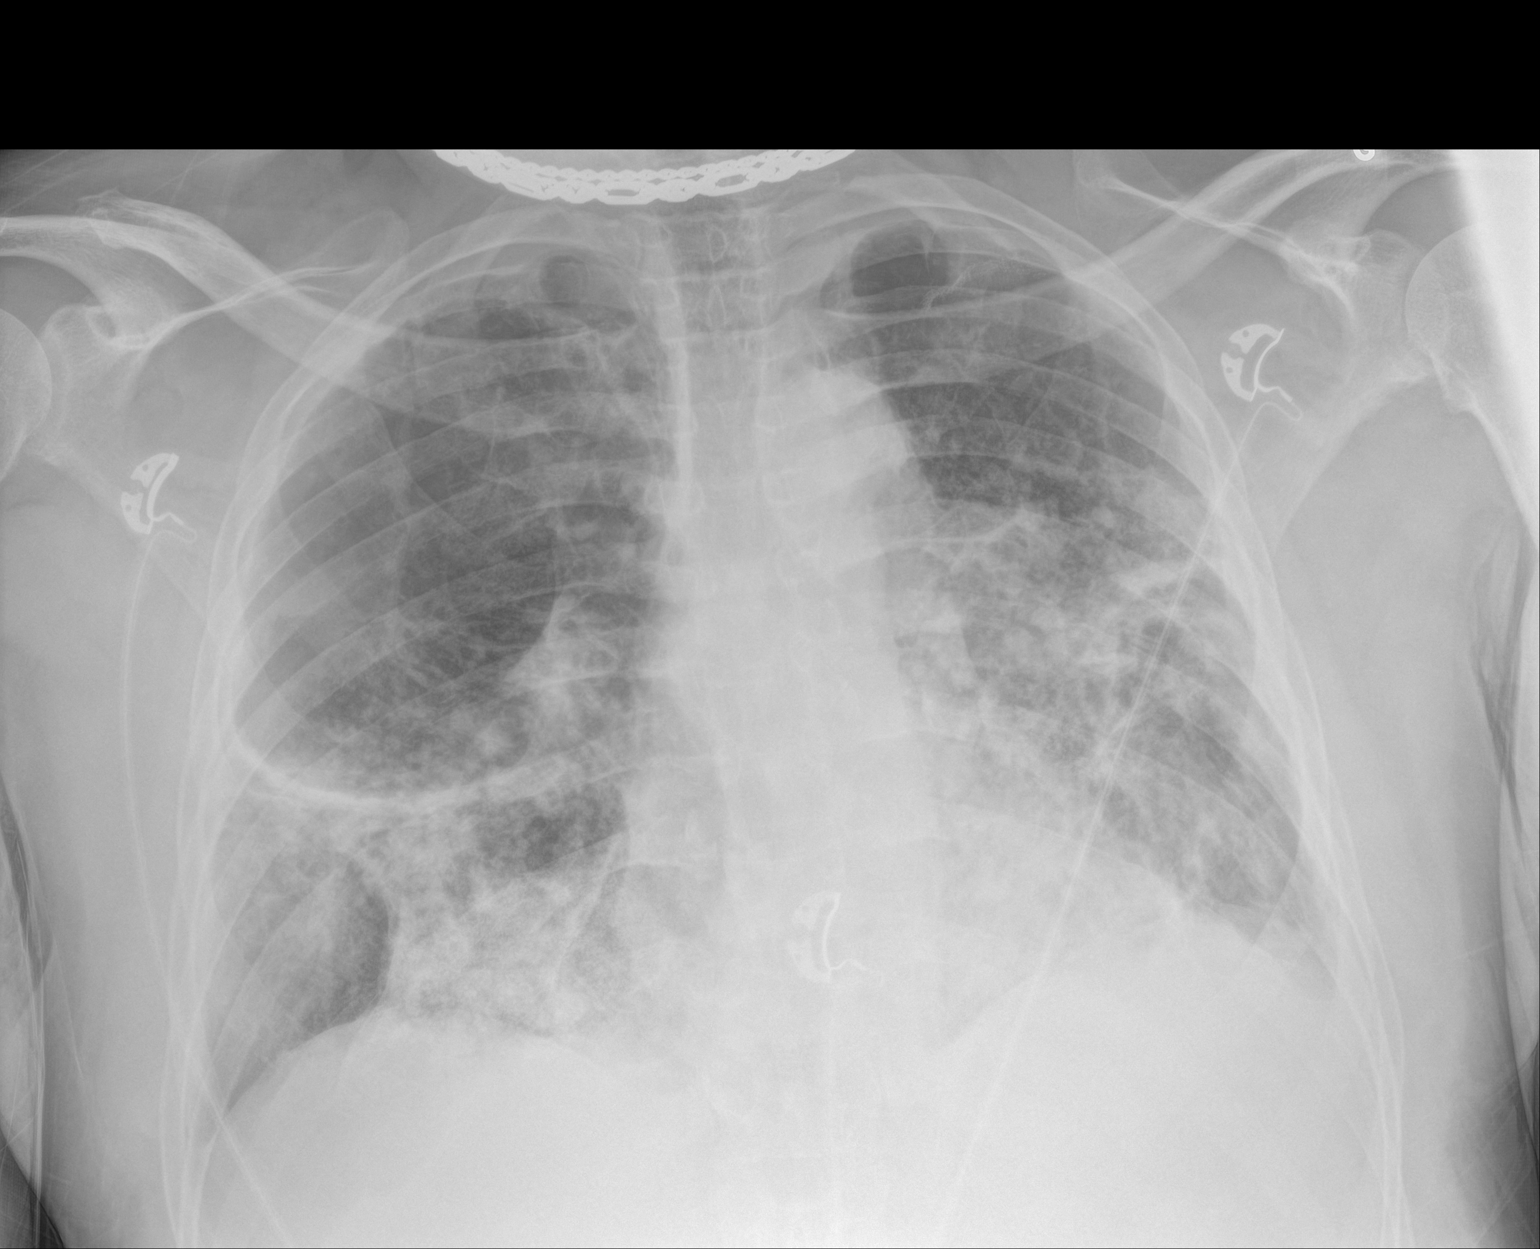

[1 of 1 positions shown; findings below may reference images not displayed]

FINDINGS: Cardiac shadow is stable chronic bilateral scarring is again
identified worst in the left lung base and right mid lung. New
persistent right basilar infiltrate is noted medially. This is
stable from the prior exam. No new focal abnormality is seen.
IMPRESSION: Changes of acute right basilar infiltrate superimposed over chronic
changes of sarcoidosis. No significant change from the prior exam is
noted.

## 2015-04-02 IMAGING — CR DG CHEST 1V PORT
1 series · 1 of 1 positions shown · non-contrast
Comparison: January 18, 2014

CLINICAL DATA: Respiratory distress.  History of sarcoidosis.

EXAM:
PORTABLE CHEST - 1 VIEW

[AP]
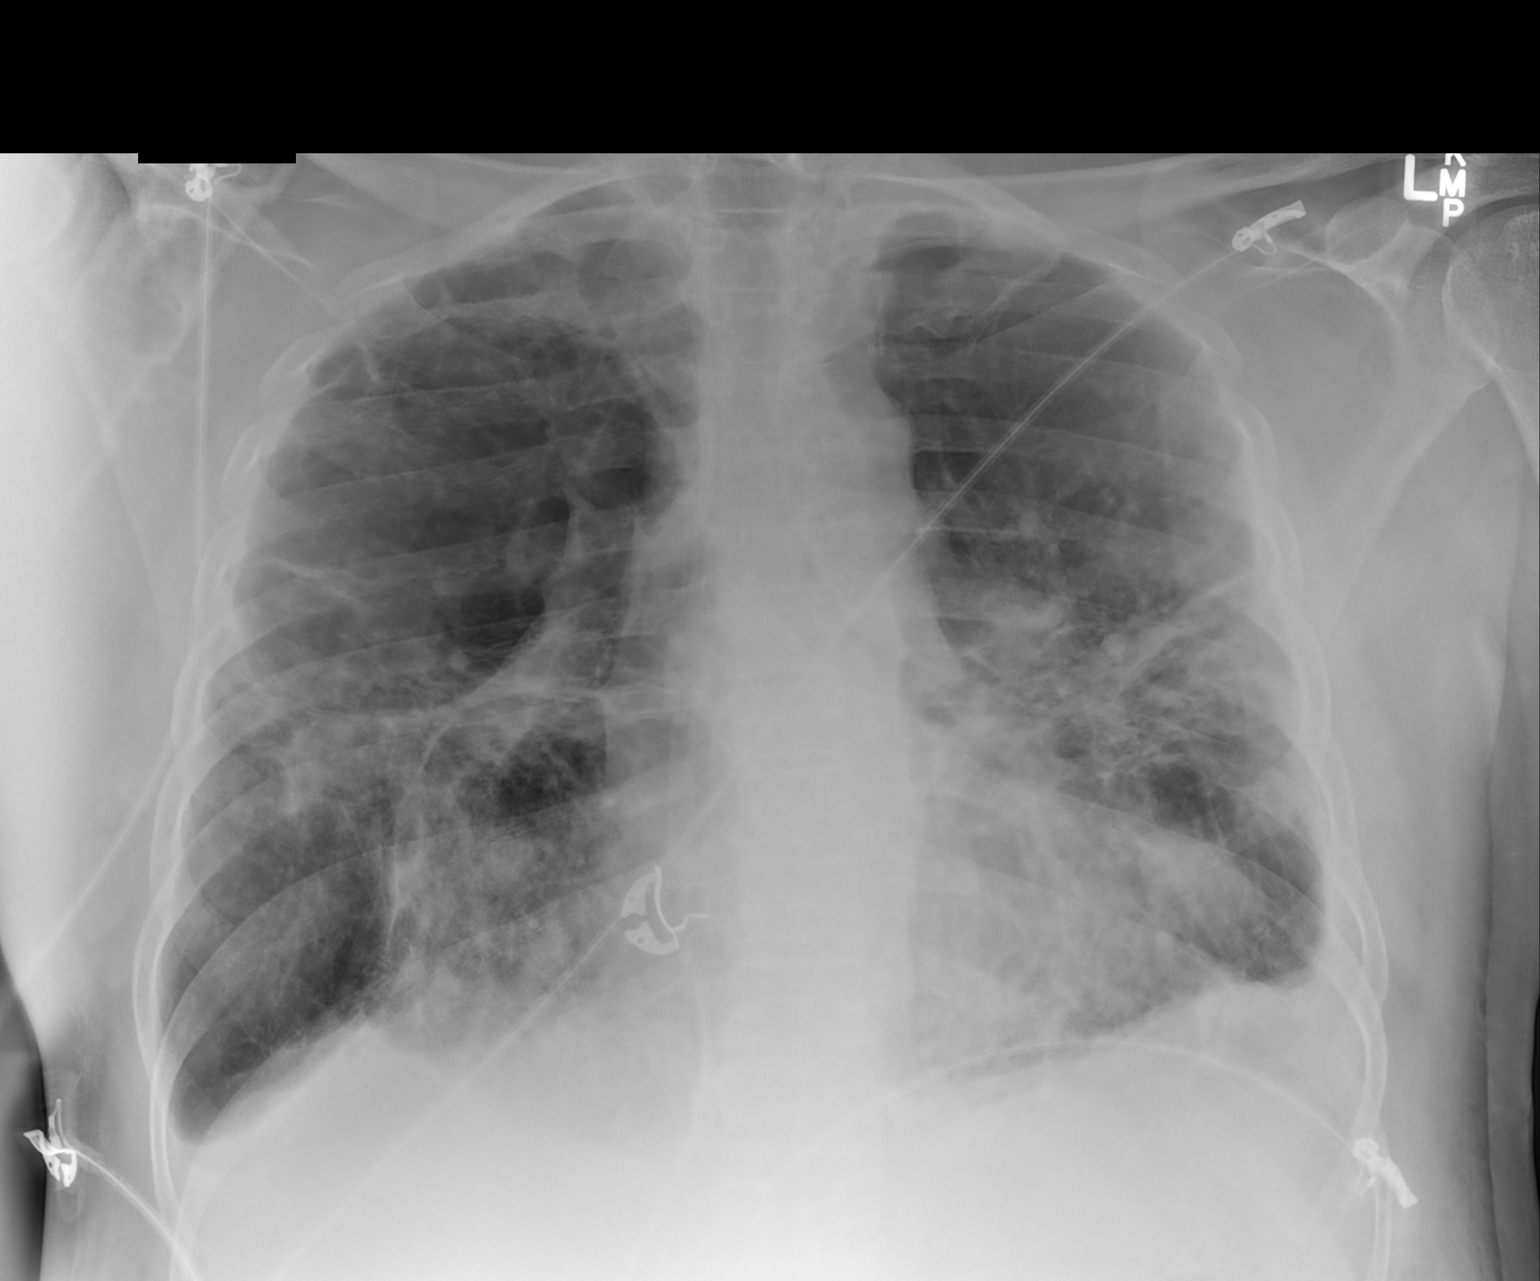

[1 of 1 positions shown; findings below may reference images not displayed]

FINDINGS: Extensive scarring and reticulonodular interstitial prominence are
stable, findings that may be seen as sequelae of sarcoidosis. There
has been some interval clearing of infiltrate from the medial right
base. There is no new opacity. Heart is upper normal in size.
Pulmonary vascularity is stable and somewhat distorted due to the
scarring. No new opacity present.
IMPRESSION: Extensive scarring and interstitial prominence, felt to be secondary
to residua from sarcoidosis. Patchy infiltrate in the right base
noted 2 days prior has partially cleared. No new opacity. No change
in cardiac silhouette.

## 2015-04-04 IMAGING — CT CT CHEST W/O CM
2 of 4 series · 15 of 36 positions shown, 18 images · non-contrast
Comparison: Multiple exams, including 01/20/2014 and 10/19/2013

CLINICAL DATA: Chronic respiratory failure. Sarcoidosis. Productive
cough. Fever. Respiratory distress. Shortness of breath.

EXAM:
CT CHEST WITHOUT CONTRAST
TECHNIQUE: Multidetector CT imaging of the chest was performed following the
standard protocol without IV contrast..

[Series 2: chest w/o st · axial · non-contrast · 0.75mm/px · z∈[-330,-40]mm · 12 of 70 slices shown, 15 images]
[im 6/70  mediastinal]
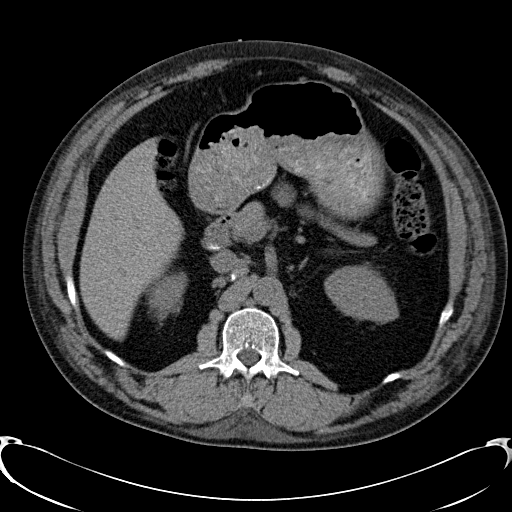
[im 6/70  lung]
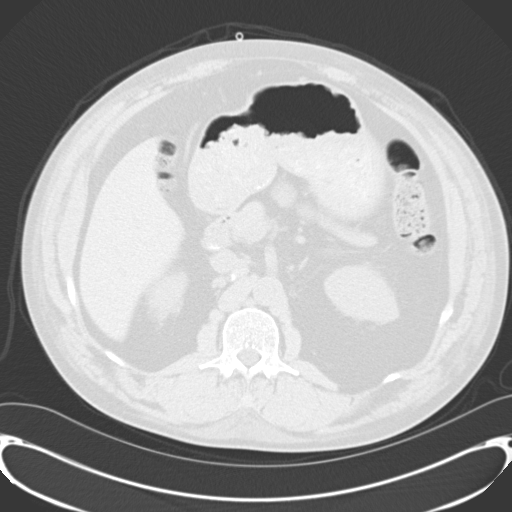
[im 11/70  lung]
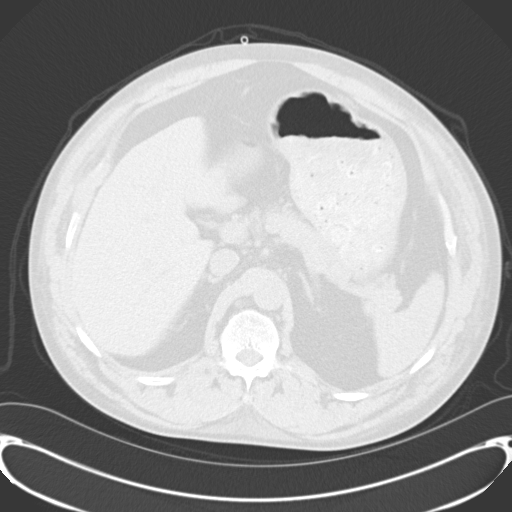
[im 16/70  lung]
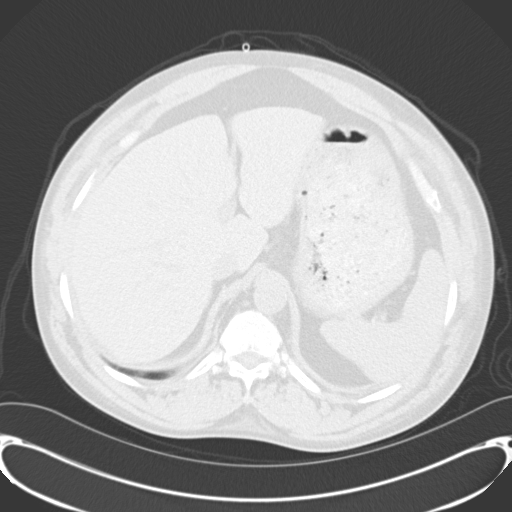
[im 22/70  lung]
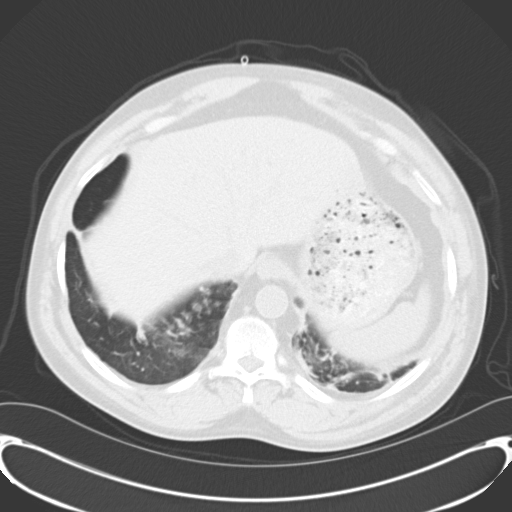
[im 27/70  mediastinal]
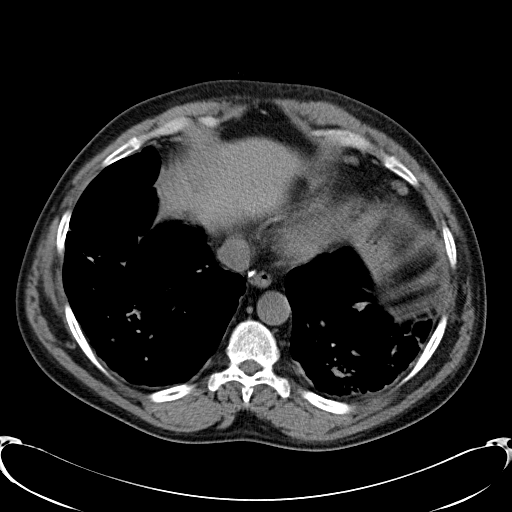
[im 27/70  lung]
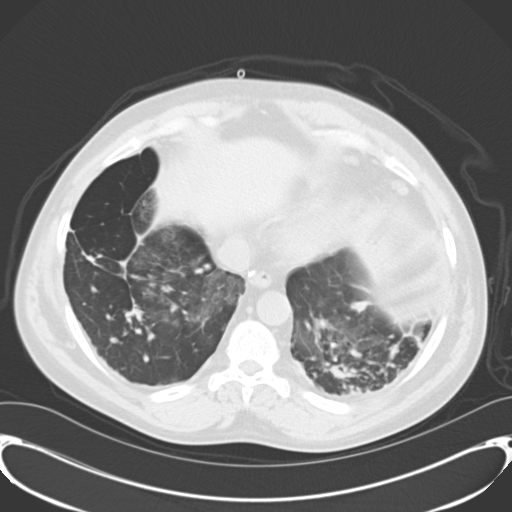
[im 32/70  lung]
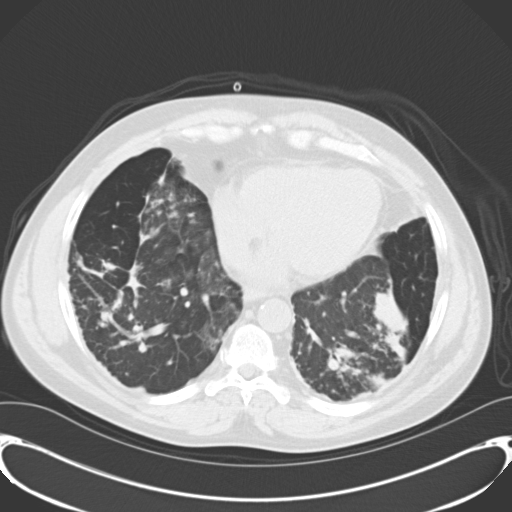
[im 38/70  lung]
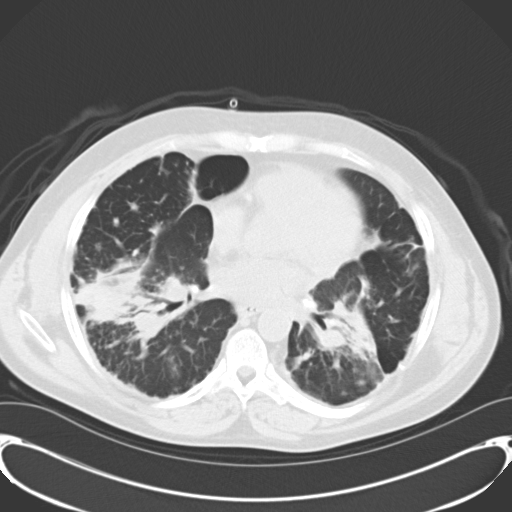
[im 43/70  lung]
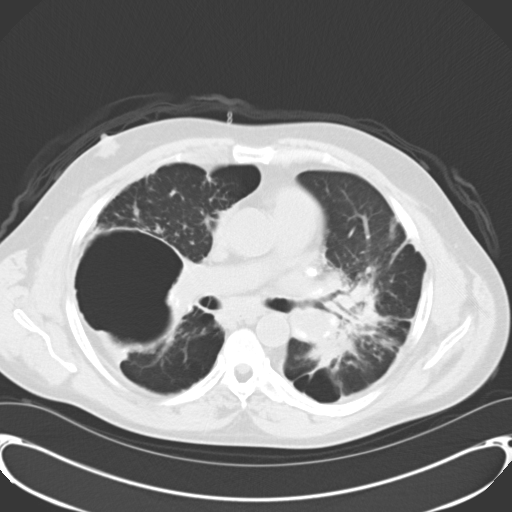
[im 48/70  mediastinal]
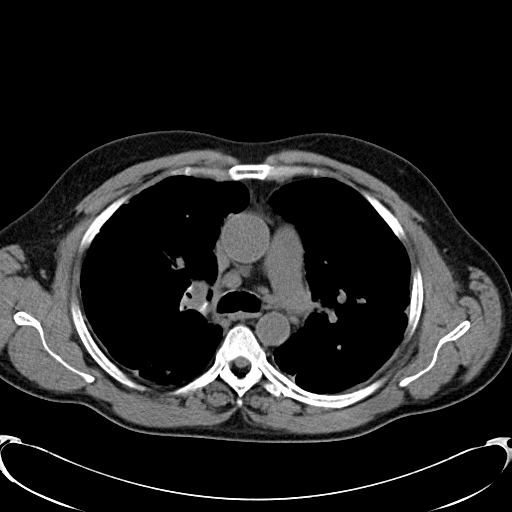
[im 48/70  lung]
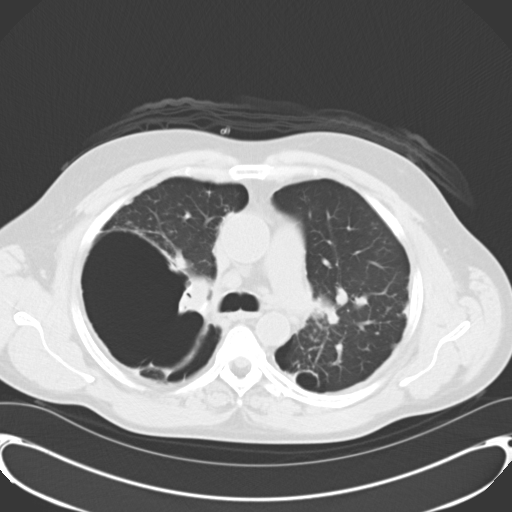
[im 54/70  lung]
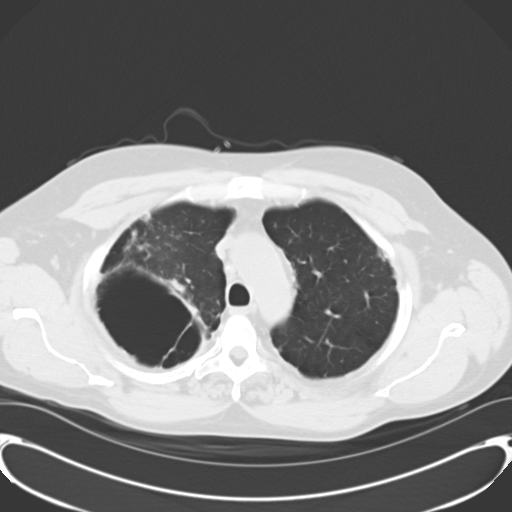
[im 59/70  lung]
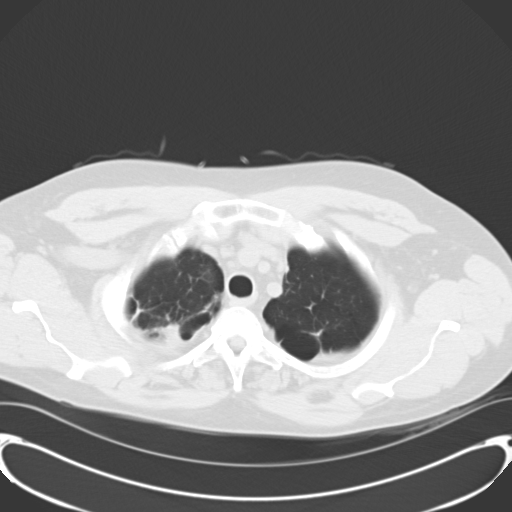
[im 64/70  lung]
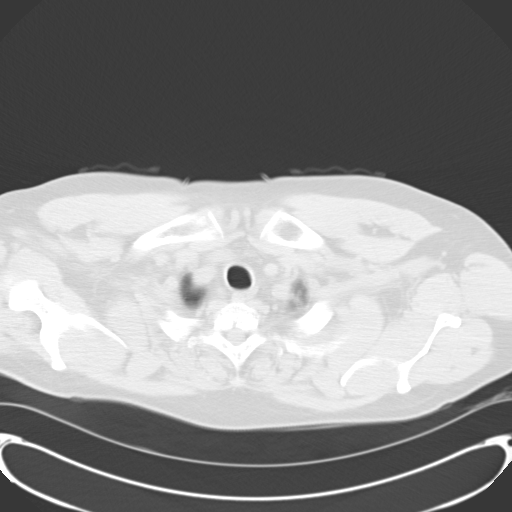

[Series 602: <mpr thick range> · coronal · 0.75mm/px · 3 of 101 slices shown]
[im 21/101  lung]
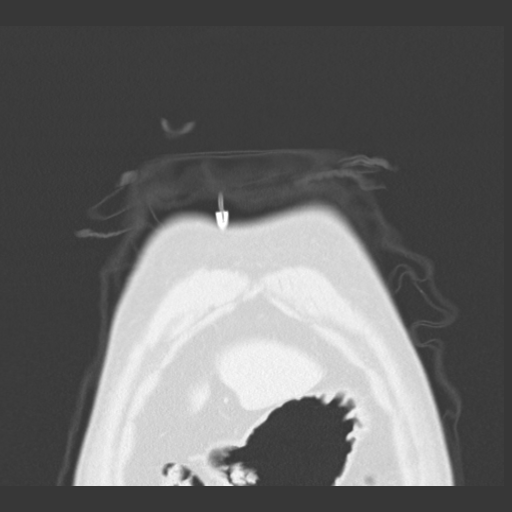
[im 41/101  lung]
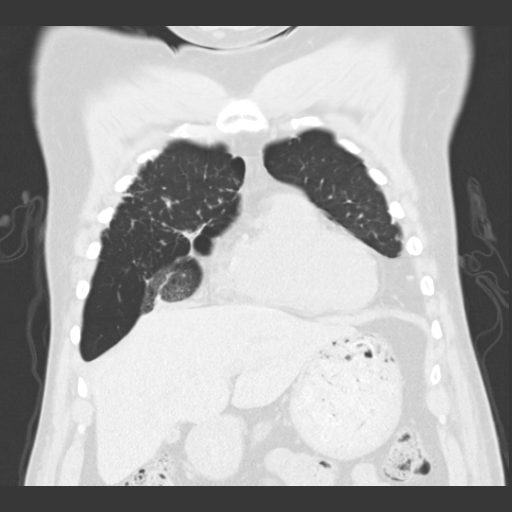
[im 61/101  lung]
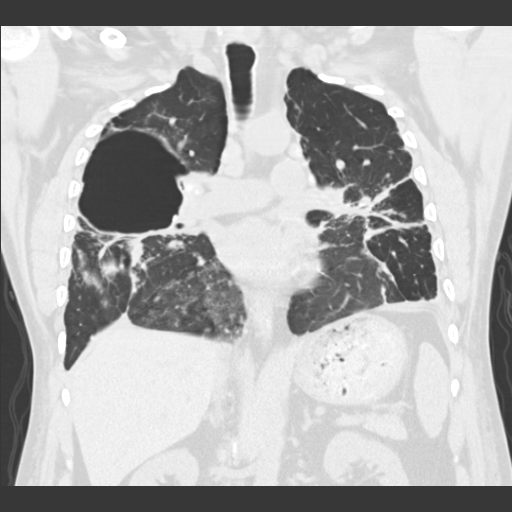

[15 of 36 positions shown; findings below may reference images not displayed]

FINDINGS: Mediastinum/Nodes: Mediastinal and hilar adenopathy with scattered
calcifications compatible with patient's known sarcoidosis. Index
right lower paratracheal lymph node 1.3 cm in short axis on image 22
series 2, previously 1.4 cm. Hilar adenopathy likewise similar to
prior, difficult to measure due to lack of IV contrast and
surrounding opacities.

Lungs/Pleura: Bilateral bullous lung disease similar to prior.
Peribronchovascular and paraseptal nodularity with scattered
nodularity observed in both lungs. Perihilar airspace opacity with
confluent nodularity, not this similar to prior exam. Central airway
narrowing observed especially in the right upper lobe. Scattered
volume loss in both lungs.

There is increased ground-glass opacity and increased secondary
pulmonary lobular septal accentuation in the right lower lobe, for
example comparing image 42 of series 5 of today' s exam with image
39 of series 3 of the 10/19/2013 exam. There is also some increased
in nodularity in the affected regions. Appearance is suspicious for
superimposed alveolitis/atypical infectious process.

Upper abdomen: Unremarkable

Musculoskeletal: Unremarkable
IMPRESSION: 1. Considerable mediastinal and hilar adenopathy with severe
parenchymal manifestations of sarcoidosis.
2. Alveolitis with increased nodularity in the right lower lobe
favoring atypical infectious process - this is new compared to prior
chest CT of 10/19/2013.

## 2015-04-24 ENCOUNTER — Emergency Department (HOSPITAL_COMMUNITY)
Admission: EM | Admit: 2015-04-24 | Discharge: 2015-04-25 | Disposition: A | Discharge status: Home / Self Care | Payer: Commercial Managed Care - HMO | Attending: Emergency Medicine | Admitting: Emergency Medicine

## 2015-04-24 ENCOUNTER — Encounter (HOSPITAL_COMMUNITY): Payer: Self-pay

## 2015-04-24 ENCOUNTER — Emergency Department (HOSPITAL_COMMUNITY): Payer: Commercial Managed Care - HMO

## 2015-04-24 DIAGNOSIS — I10 Essential (primary) hypertension: Secondary | ICD-10-CM | POA: Insufficient documentation

## 2015-04-24 DIAGNOSIS — D869 Sarcoidosis, unspecified: Secondary | ICD-10-CM | POA: Insufficient documentation

## 2015-04-24 DIAGNOSIS — Z9981 Dependence on supplemental oxygen: Secondary | ICD-10-CM | POA: Diagnosis not present

## 2015-04-24 DIAGNOSIS — Z79899 Other long term (current) drug therapy: Secondary | ICD-10-CM | POA: Insufficient documentation

## 2015-04-24 DIAGNOSIS — G4733 Obstructive sleep apnea (adult) (pediatric): Secondary | ICD-10-CM | POA: Insufficient documentation

## 2015-04-24 DIAGNOSIS — Z8701 Personal history of pneumonia (recurrent): Secondary | ICD-10-CM | POA: Insufficient documentation

## 2015-04-24 DIAGNOSIS — J471 Bronchiectasis with (acute) exacerbation: Secondary | ICD-10-CM

## 2015-04-24 DIAGNOSIS — R0600 Dyspnea, unspecified: Secondary | ICD-10-CM

## 2015-04-24 DIAGNOSIS — R0602 Shortness of breath: Secondary | ICD-10-CM | POA: Diagnosis present

## 2015-04-24 DIAGNOSIS — J441 Chronic obstructive pulmonary disease with (acute) exacerbation: Secondary | ICD-10-CM | POA: Diagnosis not present

## 2015-04-24 IMAGING — CR DG CHEST 2V
2 series · 2 of 2 positions shown · non-contrast
Comparison: Portable chest x-ray [DATE] and 10, 7140, and CT
scan of chest of January 22, 2014.

CLINICAL DATA: History of sarcoidosis, bronchitis, and previous
episodes of pneumonia, currently asymptomatic.

EXAM:
CHEST  2 VIEW

[view not recorded (1 of 2)]
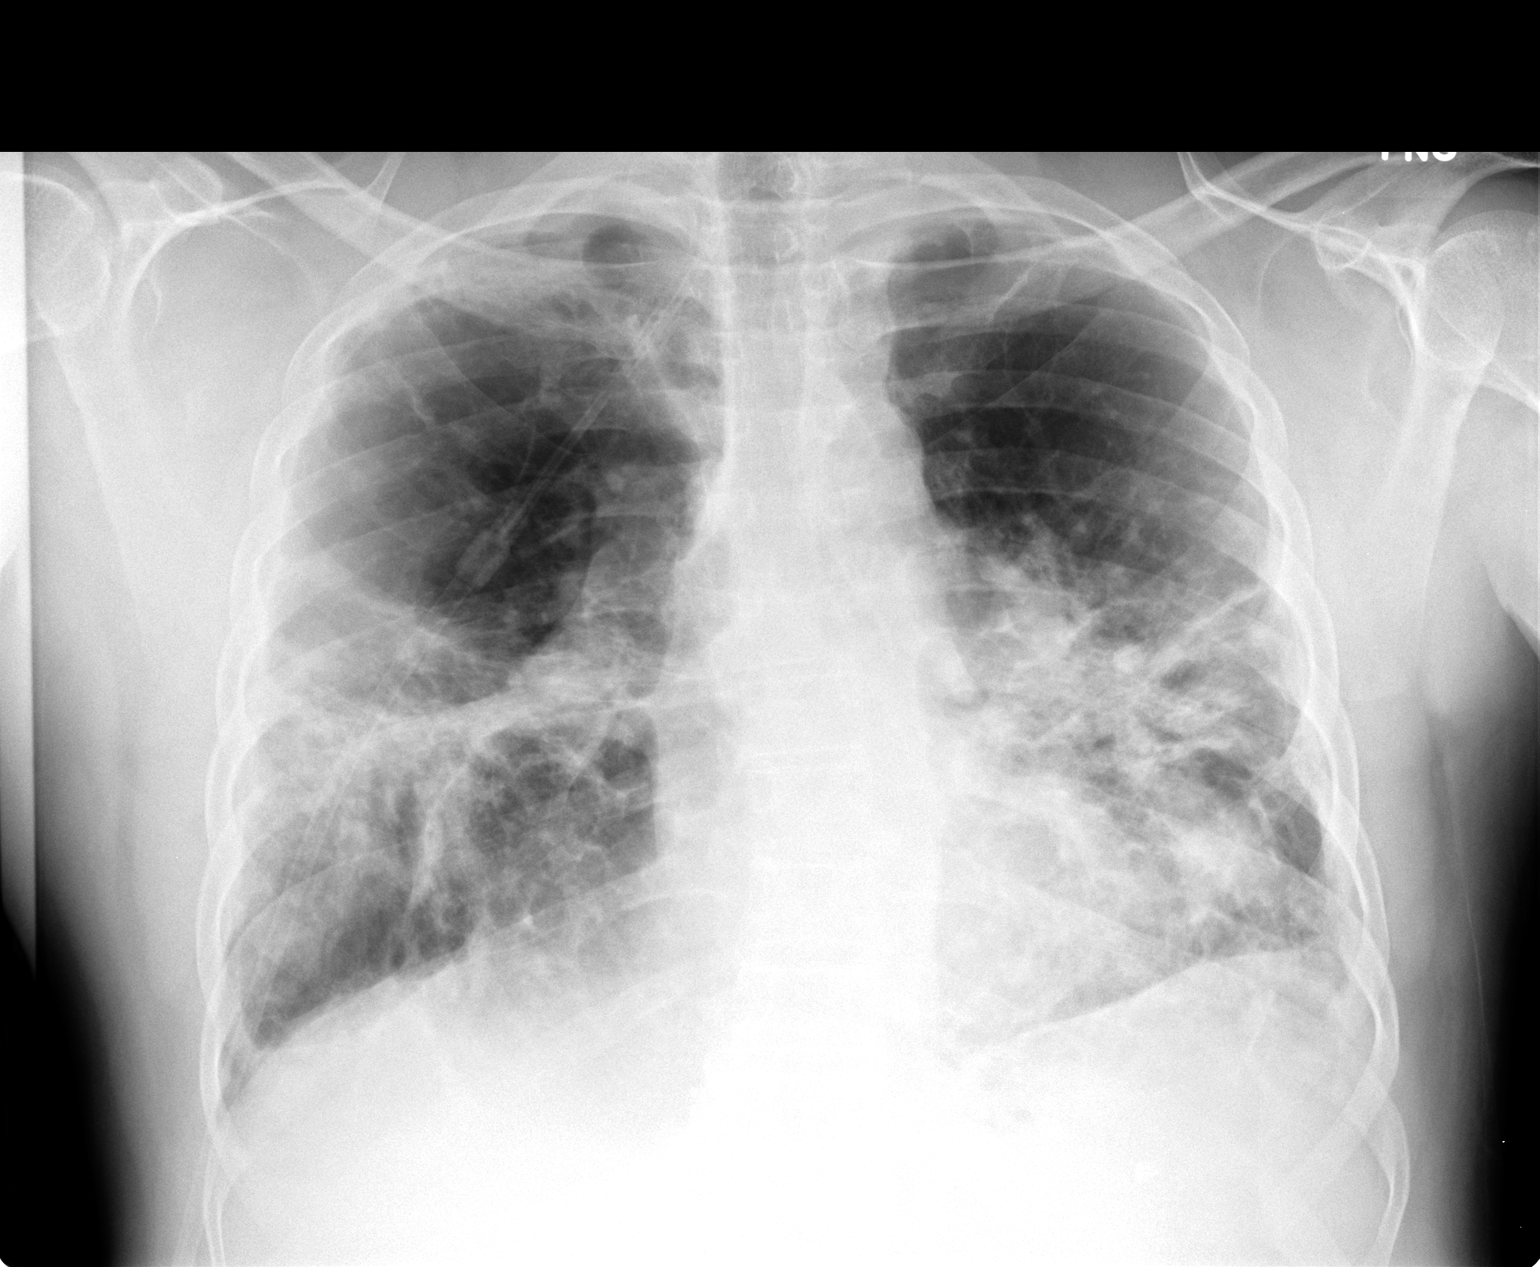

[view not recorded (2 of 2)]
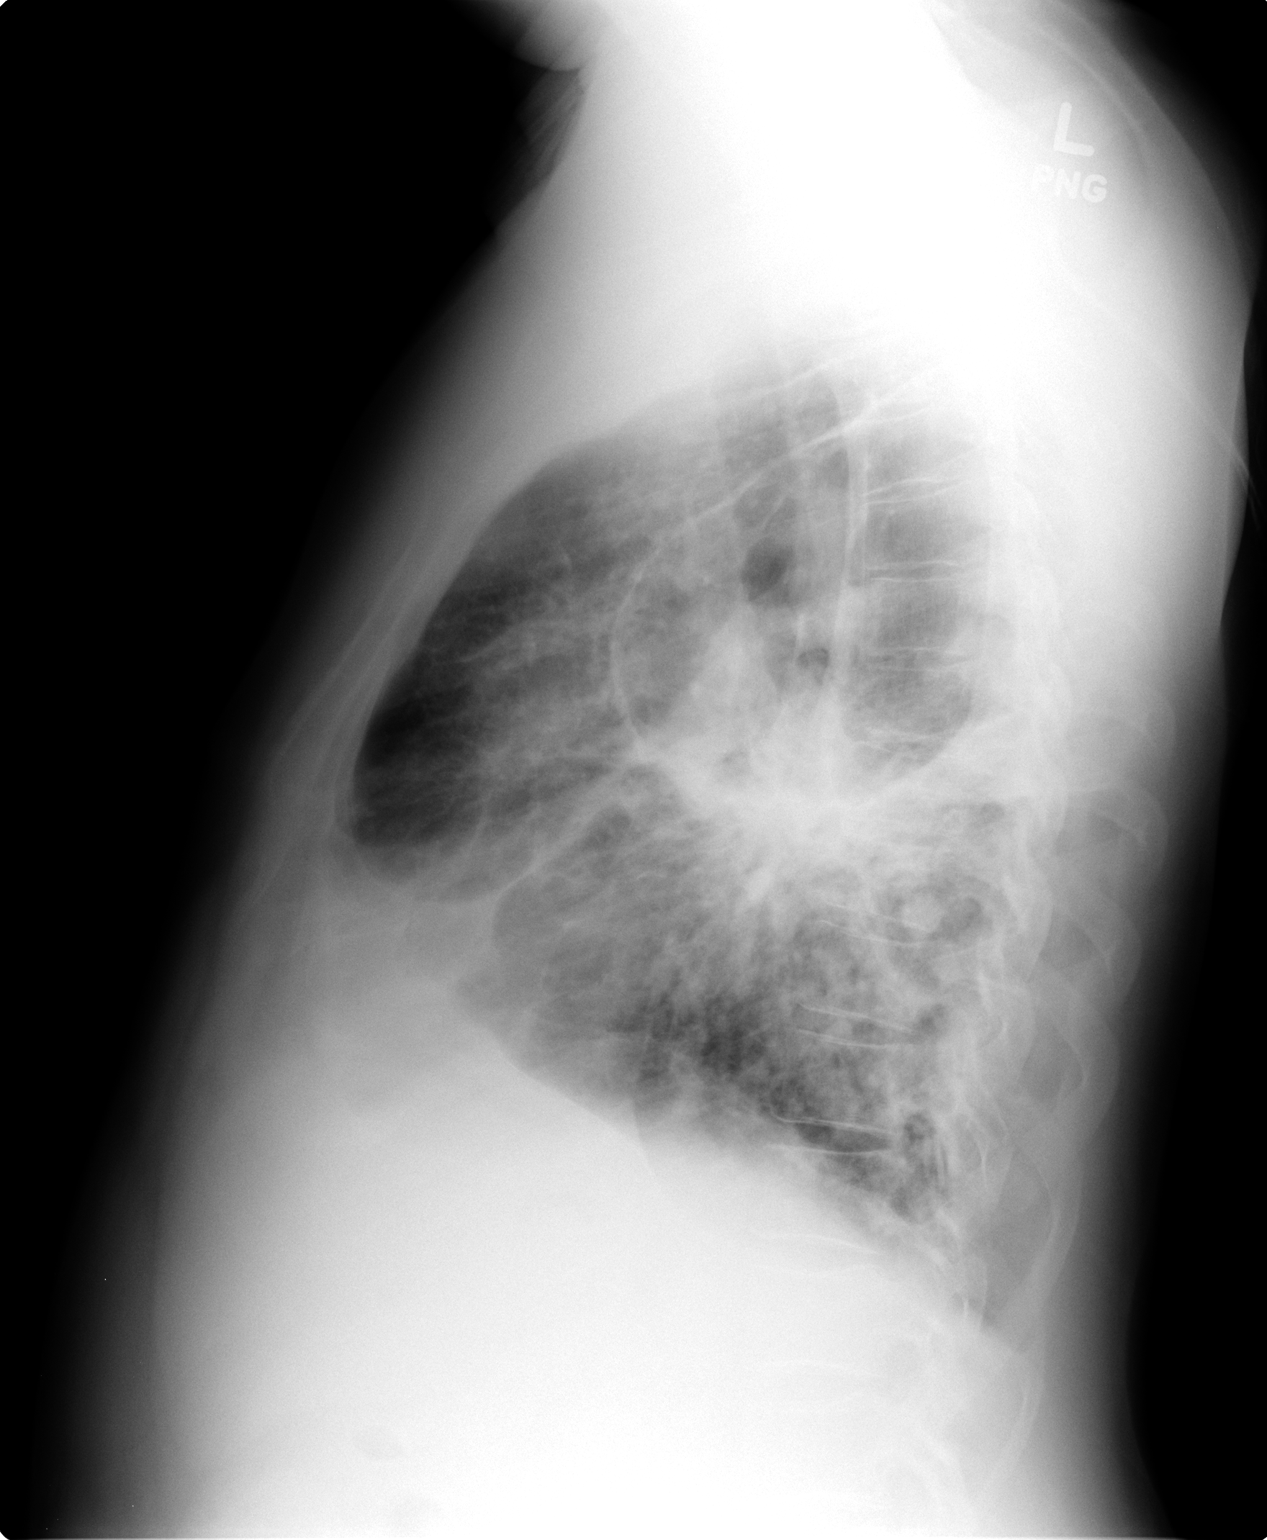

[2 of 2 positions shown; findings below may reference images not displayed]

FINDINGS: There are bilateral confluent interstitial and alveolar densities.
These have progressed significantly since the study January 12, 2014 but are not greatly changed from the study January 20, 2014.
The upper lobes are relatively spared. There is no significant
pleural effusion. There is pleural thickening on the left inferior
laterally which is stable. The hilar structures remain enlarged. The
cardiac silhouette and pulmonary vascularity are normal. The bony
structures are unremarkable.
IMPRESSION: Confluent interstitial and alveolar densities in the mid and lower
lung zones consistent with known sarcoidosis. There has not been
significant change since the study [DATE]. There is
persistent bilateral hilar lymphadenopathy.

## 2015-04-24 NOTE — ED Notes (Signed)
Pt complains of trouble breathing for two days, he wears 3-4 liters O2 all the time, he had a collasped lung 2014, he tried breathing treatments and his vest before coming to the ER

## 2015-04-25 LAB — CBC WITH DIFFERENTIAL/PLATELET
BASOS ABS: 0 10*3/uL (ref 0.0–0.1)
BASOS PCT: 0 %
EOS ABS: 0.7 10*3/uL (ref 0.0–0.7)
Eosinophils Relative: 15 %
HEMATOCRIT: 37.3 % — AB (ref 39.0–52.0)
Hemoglobin: 12.3 g/dL — ABNORMAL LOW (ref 13.0–17.0)
Lymphocytes Relative: 18 %
Lymphs Abs: 0.9 10*3/uL (ref 0.7–4.0)
MCH: 27.6 pg (ref 26.0–34.0)
MCHC: 33 g/dL (ref 30.0–36.0)
MCV: 83.6 fL (ref 78.0–100.0)
MONO ABS: 0.6 10*3/uL (ref 0.1–1.0)
MONOS PCT: 12 %
NEUTROS ABS: 2.6 10*3/uL (ref 1.7–7.7)
NEUTROS PCT: 55 %
Platelets: 268 10*3/uL (ref 150–400)
RBC: 4.46 MIL/uL (ref 4.22–5.81)
RDW: 12.5 % (ref 11.5–15.5)
WBC: 4.8 10*3/uL (ref 4.0–10.5)

## 2015-04-25 LAB — BASIC METABOLIC PANEL
ANION GAP: 8 (ref 5–15)
BUN: 13 mg/dL (ref 6–20)
CALCIUM: 9.5 mg/dL (ref 8.9–10.3)
CO2: 30 mmol/L (ref 22–32)
CREATININE: 1.1 mg/dL (ref 0.61–1.24)
Chloride: 100 mmol/L — ABNORMAL LOW (ref 101–111)
GLUCOSE: 110 mg/dL — AB (ref 65–99)
Potassium: 4.4 mmol/L (ref 3.5–5.1)
Sodium: 138 mmol/L (ref 135–145)

## 2015-04-25 MED ORDER — METHYLPREDNISOLONE SODIUM SUCC 125 MG IJ SOLR
125.0000 mg | Freq: Once | INTRAMUSCULAR | Status: AC
  Administered 2015-04-25: 125 mg via INTRAVENOUS
  Filled 2015-04-25: qty 2

## 2015-04-25 MED ORDER — ALBUTEROL (5 MG/ML) CONTINUOUS INHALATION SOLN
10.0000 mg/h | INHALATION_SOLUTION | Freq: Once | RESPIRATORY_TRACT | Status: AC
  Administered 2015-04-25: 10 mg/h via RESPIRATORY_TRACT
  Filled 2015-04-25: qty 20

## 2015-04-25 MED ORDER — ALBUTEROL (5 MG/ML) CONTINUOUS INHALATION SOLN
10.0000 mg/h | INHALATION_SOLUTION | Freq: Once | RESPIRATORY_TRACT | Status: AC
  Administered 2015-04-25: 10 mg/h via RESPIRATORY_TRACT
  Filled 2015-04-25: qty 40

## 2015-04-25 MED ORDER — IPRATROPIUM-ALBUTEROL 0.5-2.5 (3) MG/3ML IN SOLN
3.0000 mL | Freq: Once | RESPIRATORY_TRACT | Status: AC
  Administered 2015-04-25: 3 mL via RESPIRATORY_TRACT
  Filled 2015-04-25: qty 3

## 2015-04-25 MED ORDER — PREDNISONE 50 MG PO TABS: 50.0000 mg | ORAL_TABLET | Freq: Every day | ORAL | Status: DC

## 2015-04-25 MED ORDER — DOXYCYCLINE HYCLATE 100 MG PO TABS
100.0000 mg | ORAL_TABLET | Freq: Once | ORAL | Status: AC
  Administered 2015-04-25: 100 mg via ORAL
  Filled 2015-04-25: qty 1

## 2015-04-25 MED ORDER — DOXYCYCLINE HYCLATE 100 MG PO CAPS: 100.0000 mg | ORAL_CAPSULE | Freq: Two times a day (BID) | ORAL | Status: DC

## 2015-04-25 NOTE — Discharge Instructions (Signed)
Continue using your nebulizer as needed. Return if you are getting worse.  Bronchiectasis Bronchiectasis is a condition in which the airways (bronchi) are damaged and widened. This makes it difficult for the lungs to get rid of mucus. As a result, mucus gathers in the airways, and this often leads to lung infections. Infection can cause inflammation in the airways, which may further weaken and damage the bronchi.  CAUSES  Bronchiectasis may be present at birth (congenital) or may develop later in life. Sometimes there is no apparent cause. Some common causes include:  Cystic fibrosis.   Recurrent lung infections (such as pneumonia, tuberculosis, or fungal infections).  Foreign bodies or other blockages in the lungs.  Breathing in fluid, food, or other foreign objects (aspiration). SIGNS AND SYMPTOMS  Common symptoms include:  A daily cough that brings up mucus and lasts for more than 3 weeks.  Frequent lung infections (such as pneumonia, tuberculosis, or fungal infections).  Shortness of breath and wheezing.   Weakness and fatigue. DIAGNOSIS  Various tests may be done to help diagnose bronchiectasis. Tests may include:  Chest X-rays or CT scans.   Breathing tests to help determine how your lungs are working.   Sputum cultures to check for infection.   Blood tests and other tests to check for related diseases or causes, such as cystic fibrosis. TREATMENT  Treatment varies depending on the severity of the condition. Medicines may be given to loosen the mucus to be coughed up (expectorants), to relax the muscles of the air passages (bronchodilators), or to prevent or treat infections (antibiotics). Physical therapy methods may be recommended to help clear mucus from the lungs. For severe cases, surgery may be done to remove the affected part of the lung. HOME CARE INSTRUCTIONS   Get plenty of rest.   Only take over-the-counter or prescription medicines as directed by your  health care provider. If antibiotic medicines were prescribed, take them as directed. Finish them even if you start to feel better.  Avoid sedatives and antihistamines unless otherwise directed by your health care provider. These medicines tend to thicken the mucus in the lungs.   Perform any breathing exercises or techniques to clear the lungs as directed by your health care provider.  Drink enough fluids to keep your urine clear or pale yellow.  Consider using a cold steam vaporizer or humidifier in your room or home to help loosen secretions.   If the cough is worse at night, try sleeping in a semi-upright position in a recliner or using a couple of pillows.   Avoid cigarette smoke and lung irritants. If you smoke, quit.  Stay inside when pollution and ozone levels are high.   Stay current with vaccinations and immunizations.   Follow up with your health care provider as directed.  SEEK MEDICAL CARE IF:  You cough up more thick, discolored mucus (sputum) that is yellow to green in color.  You have a fever or persistent symptoms for more than 2-3 days.  You cannot control your cough and are losing sleep. SEEK IMMEDIATE MEDICAL CARE IF:   You cough up blood.   You have chest pain or increasing shortness of breath.   You have pain that is getting worse or is uncontrolled with medicines.   You have a fever and your symptoms suddenly get worse. MAKE SURE YOU:  Understand these instructions.   Will watch your condition.   Will get help right away if you are not doing well or get worse.  This information is not intended to replace advice given to you by your health care provider. Make sure you discuss any questions you have with your health care provider.   Document Released: 10/25/2006 Document Revised: 01/02/2013 Document Reviewed: 07/05/2012 Elsevier Interactive Patient Education [employer]2016 [employer] Inc.  Sarcoidosis Sarcoidosis is a disease that causes  inflammation in your organs and other areas of your body. The lungs are most often affected (pulmonary sarcoidosis). Sarcoidosis can also affect your lymph nodes, liver, eyes, skin, or any other body tissue. When you have sarcoidosis, small clumps of tissue (granulomas) form in the affected area of your body. Granulomas are made up of your body's defense (immune) cells. Inflammation results when your body reacts to a harmful substance. Normally, inflammation goes away after immune cells get rid of the harmful substance. In sarcoidosis, the immune cells form granulomas instead. CAUSES  The exact cause of sarcoidosis is not known. Something triggers the immune system to respond, such as dust, chemicals, bacteria, or a virus.  RISK FACTORS You may be at a greater risk for sarcoidosis if you:   Have a family history of the disease.  Are African American.  Are of Northern European ancestry.  Are 920-51 years old.  Are male. SIGNS AND SYMPTOMS  Many people with sarcoidosis have no symptoms. Others have very mild symptoms. Sarcoidosis most often affects the lungs. Symptoms include:  Chest pain.  Coughing.  Wheezing.  Shortness of breath. Other common symptoms include:   Night sweats.  Weight loss.  Fatigue.  Depression.  A sense of uneasiness. DIAGNOSIS  Sarcoidosis may be diagnosed by:   Medical history and physical exam.  Chest X-ray. This looks for granulomas in your lungs.  Lung function tests. These measure your breathing and look for problems related to sarcoidosis.  Examining a sample of tissue under a microscope (biopsy). TREATMENT  Sarcoidosis usually clears up without treatment. You may take medicines to reduce inflammation or relieve symptoms. These may include:  Prednisone. This steroid reduces inflammation related to sarcoidosis.  Chloroquine or hydroxychloroquine. These are antimalarial medicines used to treat sarcoidosis that affects the skin or  brain.  Methotrexate, leflunomide, or azathioprine. These medicines affect the immune system and can help with sarcoidosis in the joints, eyes, skin, or lungs.  Inhalers. Inhaled medicines can help you breathe if sarcoidosis is affecting your lungs. HOME CARE INSTRUCTIONS  Do not use any tobacco products, including cigarettes, chewing tobacco, or electronic cigarettes. If you need help quitting, ask your health care provider.  Avoid secondhand smoke.  Avoid irritating dust and chemicals. Stay indoors on days when air quality is poor in your area.  Take medicines only as directed by your health care provider. SEEK MEDICAL CARE IF:  You have vision problems.  You have shortness of breath.  You have a dry, persistent cough.  You have an irregular heartbeat.  You have pain or ache in your joints, hands, or feet.  You have an unexplained rash. SEEK IMMEDIATE MEDICAL CARE IF:  You have chest pain.   This information is not intended to replace advice given to you by your health care provider. Make sure you discuss any questions you have with your health care provider.   Document Released: 10/29/2003 Document Revised: 01/18/2014 Document Reviewed: 04/25/2013 Elsevier Interactive Patient Education 2016 Elsevier Inc.  Doxycycline tablets or capsules What is this medicine? DOXYCYCLINE (dox i SYE kleen) is a tetracycline antibiotic. It kills certain bacteria or stops their growth. It is used to treat many  kinds of infections, like dental, skin, respiratory, and urinary tract infections. It also treats acne, Lyme disease, malaria, and certain sexually transmitted infections. This medicine may be used for other purposes; ask your health care provider or pharmacist if you have questions. What should I tell my health care provider before I take this medicine? They need to know if you have any of these conditions: -liver disease -long exposure to sunlight like working outdoors -stomach  problems like colitis -an unusual or allergic reaction to doxycycline, tetracycline antibiotics, other medicines, foods, dyes, or preservatives -pregnant or trying to get pregnant -breast-feeding How should I use this medicine? Take this medicine by mouth with a full glass of water. Follow the directions on the prescription label. It is best to take this medicine without food, but if it upsets your stomach take it with food. Take your medicine at regular intervals. Do not take your medicine more often than directed. Take all of your medicine as directed even if you think you are better. Do not skip doses or stop your medicine early. Talk to your pediatrician regarding the use of this medicine in children. While this drug may be prescribed for selected conditions, precautions do apply. Overdosage: If you think you have taken too much of this medicine contact a poison control center or emergency room at once. NOTE: This medicine is only for you. Do not share this medicine with others. What if I miss a dose? If you miss a dose, take it as soon as you can. If it is almost time for your next dose, take only that dose. Do not take double or extra doses. What may interact with this medicine? -antacids -barbiturates -birth control pills -bismuth subsalicylate -carbamazepine -methoxyflurane -other antibiotics -phenytoin -vitamins that contain iron -warfarin This list may not describe all possible interactions. Give your health care provider a list of all the medicines, herbs, non-prescription drugs, or dietary supplements you use. Also tell them if you smoke, drink alcohol, or use illegal drugs. Some items may interact with your medicine. What should I watch for while using this medicine? Tell your doctor or health care professional if your symptoms do not improve. Do not treat diarrhea with over the counter products. Contact your doctor if you have diarrhea that lasts more than 2 days or if it is  severe and watery. Do not take this medicine just before going to bed. It may not dissolve properly when you lay down and can cause pain in your throat. Drink plenty of fluids while taking this medicine to also help reduce irritation in your throat. This medicine can make you more sensitive to the sun. Keep out of the sun. If you cannot avoid being in the sun, wear protective clothing and use sunscreen. Do not use sun lamps or tanning beds/booths. Birth control pills may not work properly while you are taking this medicine. Talk to your doctor about using an extra method of birth control. If you are being treated for a sexually transmitted infection, avoid sexual contact until you have finished your treatment. Your sexual partner may also need treatment. Avoid antacids, aluminum, calcium, magnesium, and iron products for 4 hours before and 2 hours after taking a dose of this medicine. If you are using this medicine to prevent malaria, you should still protect yourself from contact with mosquitos. Stay in screened-in areas, use mosquito nets, keep your body covered, and use an insect repellent. What side effects may I notice from receiving this medicine? Side effects  that you should report to your doctor or health care professional as soon as possible: -allergic reactions like skin rash, itching or hives, swelling of the face, lips, or tongue -difficulty breathing -fever -itching in the rectal or genital area -pain on swallowing -redness, blistering, peeling or loosening of the skin, including inside the mouth -severe stomach pain or cramps -unusual bleeding or bruising -unusually weak or tired -yellowing of the eyes or skin Side effects that usually do not require medical attention (report to your doctor or health care professional if they continue or are bothersome): -diarrhea -loss of appetite -nausea, vomiting This list may not describe all possible side effects. Call your doctor for  medical advice about side effects. You may report side effects to FDA at 1-800-FDA-1088. Where should I keep my medicine? Keep out of the reach of children. Store at room temperature, below 30 degrees C (86 degrees F). Protect from light. Keep container tightly closed. Throw away any unused medicine after the expiration date. Taking this medicine after the expiration date can make you seriously ill. NOTE: This sheet is a summary. It may not cover all possible information. If you have questions about this medicine, talk to your doctor, pharmacist, or health care provider.    2016, Elsevier/Gold Standard. (2014-04-19 12:10:28)  Prednisone tablets What is this medicine? PREDNISONE (PRED ni sone) is a corticosteroid. It is commonly used to treat inflammation of the skin, joints, lungs, and other organs. Common conditions treated include asthma, allergies, and arthritis. It is also used for other conditions, such as blood disorders and diseases of the adrenal glands. This medicine may be used for other purposes; ask your health care provider or pharmacist if you have questions. What should I tell my health care provider before I take this medicine? They need to know if you have any of these conditions: -Cushing's syndrome -diabetes -glaucoma -heart disease -high blood pressure -infection (especially a virus infection such as chickenpox, cold sores, or herpes) -kidney disease -liver disease -mental illness -myasthenia gravis -osteoporosis -seizures -stomach or intestine problems -thyroid disease -an unusual or allergic reaction to lactose, prednisone, other medicines, foods, dyes, or preservatives -pregnant or trying to get pregnant -breast-feeding How should I use this medicine? Take this medicine by mouth with a glass of water. Follow the directions on the prescription label. Take this medicine with food. If you are taking this medicine once a day, take it in the morning. Do not take  more medicine than you are told to take. Do not suddenly stop taking your medicine because you may develop a severe reaction. Your doctor will tell you how much medicine to take. If your doctor wants you to stop the medicine, the dose may be slowly lowered over time to avoid any side effects. Talk to your pediatrician regarding the use of this medicine in children. Special care may be needed. Overdosage: If you think you have taken too much of this medicine contact a poison control center or emergency room at once. NOTE: This medicine is only for you. Do not share this medicine with others. What if I miss a dose? If you miss a dose, take it as soon as you can. If it is almost time for your next dose, talk to your doctor or health care professional. You may need to miss a dose or take an extra dose. Do not take double or extra doses without advice. What may interact with this medicine? Do not take this medicine with any of the following  medications: -metyrapone -mifepristone This medicine may also interact with the following medications: -aminoglutethimide -amphotericin B -aspirin and aspirin-like medicines -barbiturates -certain medicines for diabetes, like glipizide or glyburide -cholestyramine -cholinesterase inhibitors -cyclosporine -digoxin -diuretics -ephedrine -male hormones, like estrogens and birth control pills -isoniazid -ketoconazole -NSAIDS, medicines for pain and inflammation, like ibuprofen or naproxen -phenytoin -rifampin -toxoids -vaccines -warfarin This list may not describe all possible interactions. Give your health care provider a list of all the medicines, herbs, non-prescription drugs, or dietary supplements you use. Also tell them if you smoke, drink alcohol, or use illegal drugs. Some items may interact with your medicine. What should I watch for while using this medicine? Visit your doctor or health care professional for regular checks on your progress. If  you are taking this medicine over a prolonged period, carry an identification card with your name and address, the type and dose of your medicine, and your doctor's name and address. This medicine may increase your risk of getting an infection. Tell your doctor or health care professional if you are around anyone with measles or chickenpox, or if you develop sores or blisters that do not heal properly. If you are going to have surgery, tell your doctor or health care professional that you have taken this medicine within the last twelve months. Ask your doctor or health care professional about your diet. You may need to lower the amount of salt you eat. This medicine may affect blood sugar levels. If you have diabetes, check with your doctor or health care professional before you change your diet or the dose of your diabetic medicine. What side effects may I notice from receiving this medicine? Side effects that you should report to your doctor or health care professional as soon as possible: -allergic reactions like skin rash, itching or hives, swelling of the face, lips, or tongue -changes in emotions or moods -changes in vision -depressed mood -eye pain -fever or chills, cough, sore throat, pain or difficulty passing urine -increased thirst -swelling of ankles, feet Side effects that usually do not require medical attention (report to your doctor or health care professional if they continue or are bothersome): -confusion, excitement, restlessness -headache -nausea, vomiting -skin problems, acne, thin and shiny skin -trouble sleeping -weight gain This list may not describe all possible side effects. Call your doctor for medical advice about side effects. You may report side effects to FDA at 1-800-FDA-1088. Where should I keep my medicine? Keep out of the reach of children. Store at room temperature between 15 and 30 degrees C (59 and 86 degrees F). Protect from light. Keep container  tightly closed. Throw away any unused medicine after the expiration date. NOTE: This sheet is a summary. It may not cover all possible information. If you have questions about this medicine, talk to your doctor, pharmacist, or health care provider.    2016, Elsevier/[employer] Standard. (2010-08-13 10:57:14)

## 2015-04-25 NOTE — ED Notes (Signed)
RT in room w tx

## 2015-04-25 NOTE — ED Provider Notes (Signed)
CSN: [id number]     Arrival date & time 04/24/15  2159 History  By signing my name below, I, Bethel Born, attest that this documentation has been prepared under the direction and in the presence of Dione Booze, MD. Electronically Signed: Bethel Born, ED Scribe. 04/25/2015. 1:42 AM     Chief Complaint  Patient presents with  . Shortness of Breath    The history is provided by the patient. No language interpreter was used.   [name] is a 49 y.o. male with history of COPD, Sarcoidosis, and bronchitis who is on 4 L of oxygen at home presenting to the Emergency Department complaining of constant and worsened SOB with onset 2 days ago. His breathing is exacerbated by any movement. Nebulizer treatments and using a "chest vest" have provided insufficient improvement in symptoms at home. Associated symptoms include right-sided chest pain with breathing, a cough productive of green/yellow/brown/red-tinged sputum, and feeling hot. Pt denies fever.   Past Medical History  Diagnosis Date  . Hypertension   . Sarcoidosis (HCC)   . OSA on CPAP   . Chronic respiratory failure (HCC)     home O@ 4L (01/28/14)  . Bronchitis   . Pneumonia   . COPD (chronic obstructive pulmonary disease) (HCC)   . Neuromuscular disorder [facility])    Past Surgical History  Procedure Laterality Date  . Rotator cuff repair    . Arm surgery     . Video assisted thoracoscopy (vats)/thorocotomy Left 07/17/2012    Procedure: VIDEO ASSISTED THORACOSCOPY (VATS)/BLEB Stapling;  Surgeon: Alleen Borne, MD;  Location: [facility] OR;  Service: Thoracic;  Laterality: Left;   Family History  Problem Relation Age of Onset  . Diabetes Father    Social History  Substance Use Topics  . Smoking status: Never Smoker   . Smokeless tobacco: Never Used  . Alcohol Use: Yes     Comment: social    Review of Systems  Constitutional: Negative for fever.       Feeling hot   Respiratory: Positive for cough and shortness of breath.    Cardiovascular: Positive for chest pain.  All other systems reviewed and are negative.  Allergies  Clindamycin/lincomycin  Home Medications   Prior to Admission medications   Medication Sig Start Date End Date Taking? Authorizing Provider  albuterol (PROVENTIL) (2.5 MG/3ML) 0.083% nebulizer solution Take 2.5 mg by nebulization every 4 (four) hours as needed for wheezing or shortness of breath (wheezing and shortness of breath).  06/08/12  Yes Historical Provider, MD  bisacodyl (BISACODYL) 5 MG EC tablet Take 1 tablet (5 mg total) by mouth daily as needed for moderate constipation. 01/16/14  Yes Simonne Martinet, NP  bisoprolol (ZEBETA) 10 MG tablet Take 1 tablet (10 mg total) by mouth daily. 03/26/14  Yes Maryann Mikhail, DO  calcium-vitamin D (OSCAL WITH D) 500-200 MG-UNIT per tablet Take 1 tablet by mouth every morning.   Yes Historical Provider, MD  cyclobenzaprine (FLEXERIL) 10 MG tablet Take 10 mg by mouth 3 (three) times daily as needed for muscle spasms (muscle spasms).    Yes Historical Provider, MD  diltiazem (CARDIZEM CD) 120 MG 24 hr capsule Take 1 capsule (120 mg total) by mouth daily. 01/28/14  Yes Jeanella Craze, NP  diphenhydrAMINE (BENADRYL) 25 MG tablet Take 25 mg by mouth every 6 (six) hours as needed for allergies (allergies).    Yes Historical Provider, MD  ferrous sulfate 325 (65 FE) MG tablet Take 1 tablet (325 mg total) by  mouth 2 (two) times daily with a meal. 06/18/12  Yes Leroy Sea, MD  fluticasone (FLONASE) 50 MCG/ACT nasal spray Place 2 sprays into both nostrils 2 (two) times daily. Patient taking differently: Place 2 sprays into both nostrils 2 (two) times daily as needed for allergies.  01/28/14  Yes Jeanella Craze, NP  guaiFENesin (MUCINEX) 600 MG 12 hr tablet Take 1 tablet (600 mg total) by mouth 2 (two) times daily. 01/16/15  Yes Nishant Dhungel, MD  Multiple Vitamins-Minerals (MULTIVITAMIN WITH MINERALS) tablet Take 1 tablet by mouth every morning.    Yes  Historical Provider, MD  omeprazole (PRILOSEC) 40 MG capsule Take 40 mg by mouth daily. 11/04/14  Yes Historical Provider, MD  ondansetron (ZOFRAN) 4 MG tablet Take 4 mg by mouth every 8 (eight) hours as needed for nausea or vomiting.  09/05/14  Yes Historical Provider, MD  VENTOLIN HFA 108 (90 Base) MCG/ACT inhaler INHALE TWO PUFFS BY MOUTH EVERY 6 HOURS AS NEEDED FOR WHEEZING FOR SHORTNESS OF BREATH 03/27/15  Yes Leslye Peer, MD  budesonide-formoterol (SYMBICORT) 160-4.5 MCG/ACT inhaler Inhale 2 puffs into the lungs 2 (two) times daily. Patient not taking: Reported on 04/24/2015 11/28/13   Jeanella Craze, NP  levofloxacin (LEVAQUIN) 500 MG tablet Take 1 tablet (500 mg total) by mouth daily. Patient not taking: Reported on 04/24/2015 03/25/15   Leslye Peer, MD  polyethylene glycol [facility] Ethelene Hal) packet Take 17 g by mouth daily. Patient taking differently: Take 17 g by mouth daily as needed for mild constipation or moderate constipation.  01/16/14   Simonne Martinet, NP  predniSONE (DELTASONE) 10 MG tablet Take  x 3 days,  x 3 days,  x 3 days,  x 3 days, then STOP Patient not taking: Reported on 04/24/2015 03/25/15   Leslye Peer, MD   BP 133/90 mmHg  Pulse 96  Temp(Src) 98.3 F (36.8 C) (Oral)  Resp 20  SpO2 96% Physical Exam  Constitutional: He is oriented to person, place, and time. He appears well-developed and well-nourished.  HENT:  Head: Normocephalic and atraumatic.  Eyes: Conjunctivae and EOM are normal. Pupils are equal, round, and reactive to light.  Neck: Normal range of motion. Neck supple. No JVD present.  Cardiovascular: Normal rate, regular rhythm, normal heart sounds and intact distal pulses.   No murmur heard. Pulmonary/Chest: Effort normal. No respiratory distress. He has wheezes. He has no rales. He exhibits no tenderness.  Decreased air movement Prolonged exhalation phase with a few scattered wheezes  Abdominal: Soft. Bowel sounds are normal. He  exhibits no distension and no mass. There is no tenderness.  Musculoskeletal: Normal range of motion. He exhibits no edema.  Lymphadenopathy:    He has no cervical adenopathy.  Neurological: He is alert and oriented to person, place, and time. No cranial nerve deficit. He exhibits normal muscle tone. Coordination normal.  Skin: Skin is warm and dry. No rash noted.  Psychiatric: He has a normal mood and affect. His behavior is normal. Judgment and thought content normal.  Nursing note and vitals reviewed.   ED Course  Procedures (including critical care time) DIAGNOSTIC STUDIES: Oxygen Saturation is 96% on 4L, adequate by my interpretation.    COORDINATION OF CARE: 1:39 AM Discussed treatment plan which includes lab work, CXR, EKG, a breathing treatment, and solu-medrol with pt at bedside and pt agreed to plan.  Labs Review Results for orders placed or performed during the hospital encounter of 04/24/15  Basic metabolic  panel  Result Value Ref Range   Sodium 138 135 - 145 mmol/L   Potassium 4.4 3.5 - 5.1 mmol/L   Chloride 100 (L) 101 - 111 mmol/L   CO2 30 22 - 32 mmol/L   Glucose, Bld 110 (H) 65 - 99 mg/dL   BUN 13 6 - 20 mg/dL   Creatinine, Ser 1.611.10 0.61 - 1.24 mg/dL   Calcium 9.5 8.9 - 09.610.3 mg/dL   GFR calc non Af Amer >60 >60 mL/min   GFR calc Af Amer >60 >60 mL/min   Anion gap 8 5 - 15  CBC with Differential  Result Value Ref Range   WBC 4.8 4.0 - 10.5 K/uL   RBC 4.46 4.22 - 5.81 MIL/uL   Hemoglobin 12.3 (L) 13.0 - 17.0 g/dL   HCT 04.537.3 (L) 40.939.0 - 81.152.0 %   MCV 83.6 78.0 - 100.0 fL   MCH 27.6 26.0 - 34.0 pg   MCHC 33.0 30.0 - 36.0 g/dL   RDW 91.412.5 78.211.5 - 95.615.5 %   Platelets 268 150 - 400 K/uL   Neutrophils Relative % 55 %   Neutro Abs 2.6 1.7 - 7.7 K/uL   Lymphocytes Relative 18 %   Lymphs Abs 0.9 0.7 - 4.0 K/uL   Monocytes Relative 12 %   Monocytes Absolute 0.6 0.1 - 1.0 K/uL   Eosinophils Relative 15 %   Eosinophils Absolute 0.7 0.0 - 0.7 K/uL   Basophils Relative  0 %   Basophils Absolute 0.0 0.0 - 0.1 K/uL   Imaging Review Dg Chest 2 View  04/24/2015  CLINICAL DATA:  Shortness of breath for several days. History of sarcoidosis EXAM: CHEST  2 VIEW COMPARISON:  January 13, 2015 chest radiograph and chest CT September 30, 2014 FINDINGS: There is widespread fibrotic change throughout the lungs bilaterally with airspace consolidation in both mid lungs and left lower lobe region, stable. There is scarring in the upper lobes as well as in the right base with chronic lateral basilar pleural thickening. There is a large bulla in the right upper lobe, stable. No new opacity is evident. The heart size and pulmonary vascularity are normal. No adenopathy is apparent. No bone lesions are evident. IMPRESSION: Extensive cicatrization as well as areas of patchy airspace consolidation, more severe on the left than on the right, and scarring throughout the lungs bilaterally. No new opacity is evident. No adenopathy. The changes in the lungs are consistent with chronic sarcoidosis. It should be noted that subtle acute pneumonia could easily be obscured with this degree of underlying parenchymal scarring and fibrotic type change. Electronically Signed   By: Bretta BangWilliam  Woodruff III M.D.   On: 04/24/2015 22:31   I have personally reviewed and evaluated these images and lab results as part of my medical decision-making.   EKG Interpretation   Date/Time:  Thursday April 24 2015 22:15:21 EDT Ventricular Rate:  97 PR Interval:  184 QRS Duration: 81 QT Interval:  339 QTC Calculation: 431 R Axis:   91 Text Interpretation:  Sinus rhythm Borderline right axis deviation When  compared with ECG of 01/14/2015, HEART RATE has decreased Confirmed by [facility]   MD, Keerthi Hazell ([id number]) on 04/25/2015 1:54:44 AM      MDM   Final diagnoses:  Dyspnea  Sarcoidosis (HCC)  Bronchiectasis with acute exacerbation (HCC)    Patient with complex woman a history of sarcoidosis, bronchiectasis comes in with  exacerbation of same. Chest x-ray is essentially unchanged from baseline, but was severe scarring, it is  difficult to be sure there is not an underlying pneumonia. Old records are reviewed and he does have history of having to be hospitalized for exacerbation of bronchiectasis. He was treated aggressively with albuterol and ipratropium as well as some methylprednisolone with significant improvement. Following treatment, he was resting comfortably and oxygen saturations were 98-100% on oxygen levels that he normally uses at home. It is decided to treat him for possible underlying infection and is discharged with prescription for doxycycline as well as prednisone. He is to follow-up with his pulmonologist in 3 days. Return to the ED if symptoms are worsening.  I personally performed the services described in this documentation, which was scribed in my presence. The recorded information has been reviewed and is accurate.      Dione Booze, MD 04/25/15 [phone]

## 2015-04-28 ENCOUNTER — Ambulatory Visit (INDEPENDENT_AMBULATORY_CARE_PROVIDER_SITE_OTHER): Payer: Commercial Managed Care - HMO | Admitting: Emergency Medicine

## 2015-04-28 ENCOUNTER — Encounter: Payer: Self-pay | Admitting: Emergency Medicine

## 2015-04-28 VITALS — BP 128/72 | HR 99 | Ht 67.5 in | Wt 170.8 lb

## 2015-04-28 DIAGNOSIS — G4733 Obstructive sleep apnea (adult) (pediatric): Secondary | ICD-10-CM | POA: Diagnosis not present

## 2015-04-28 DIAGNOSIS — J9611 Chronic respiratory failure with hypoxia: Secondary | ICD-10-CM

## 2015-04-28 DIAGNOSIS — J471 Bronchiectasis with (acute) exacerbation: Secondary | ICD-10-CM | POA: Diagnosis not present

## 2015-04-28 DIAGNOSIS — Z9989 Dependence on other enabling machines and devices: Secondary | ICD-10-CM

## 2015-04-28 NOTE — Progress Notes (Signed)
Subjective:    Patient ID: [name], male    DOB: Nov 11, 1965, 50 y.o.   MRN: [id number]  HPI 50 yo male never smoker with sarcoidosis and bronchiectasis and chronic respiratory failure with chronic hypoxemia needing oxygen and sleep apnea needing CPAP   ROV 06/17/14 -- follow-up visit for chronic respiratory failure in the setting of sarcoidosis, bronchiectasis and obstructive sleep apnea. He has ups and downs with his exertional tolerance. He has thick mucous, some difficulty clearing secretions.  He is off prednisone. Taking mucinex bid. He uses flutter some days. Has used SABA more freq, mutliple times every day. Able to walk up stairs without stopping.   ROV 10/11/14 -- patient follows up for chronic respiratory failure, obstructive and restrictive disease with bronchiectasis due to sarcoidosis. He also has a history of sleep apnea. He frequently exacerbates and was just admitted to the hospital. At the time of discharge he was sent out with a chest vest to assist with mucus clearance. He is currently on Symbicort, albuterol when necessary. He is using the chest vest 3x a day and seems to be benefiting. He has finished levaquin. He is using albuterol about 3x a day, in association with his mucous clearance. His exertional tolerance is better than at the time of his hospitalization.  Needs Prevnar-13  ROV / Hosp f/u visit 01/28/15 -- follow-up visit for chronic respiratory failure, obstructive and restrictive disease with associated bronchiectasis due to sarcoidosis. He also has a history of sleep apnea. He uses crack cocaine. He was just admitted for an exacerbation of his bronchiectasis with possible pneumonia. He received IV antibiotics and was discharged on 01/16/15.  He continues to cough, has been using the vibra-vest with good effect, tid.  He is on symbicort, albuterol nebs on a schedule, alb HFA prn. His O2 is at 3L/min. He is sleeping with CPAP - [employer]  ROV 04/28/15 -- follow-up visit  for patient with history of sarcoidosis with associated interstitial disease and bronchiectasis, obstructive lung disease and sleep apnea. He also has a history of cocaine abuse. He was experiencing dyspnea and cough in March and we called in a prednisone taper for acute exacerbation. He went to the ED last week for the sensation of B chest pain with respiration. He was treated again with abx and prednisone, felt that he was helped the most w continuous neb. No new infiltrate noted on CXR. He feels better now. He is using CPAP reliably. He ran out of symbicort last month, cannot afford it.    Review of Systems As per HPI     Objective:   Physical Exam  Filed Vitals:   04/28/15 1433  BP: 128/72  Pulse: 99  Height: 5' 7.5" (1.715 m)  Weight: 170 lb 12.8 oz (77.474 kg)  SpO2: 91%    GEN: A/Ox3; pleasant , NAD  HEENT:  OP clear, no erythema  NECK:  No stridor  RESP  Decreased BS in bases , no accessory muscle use  CARD:  RRR, no m/r/g  , no peripheral edema, pulses intact, no cyanosis or clubbing.  Musco: Warm bil, no deformities or joint swelling noted.   Neuro: alert, no focal deficits noted.    Skin: Warm, no lesions or rashes  04/24/15 --  COMPARISON: January 13, 2015 chest radiograph and chest CT September 30, 2014  FINDINGS: There is widespread fibrotic change throughout the lungs bilaterally with airspace consolidation in both mid lungs and left lower lobe region, stable. There is scarring in  the upper lobes as well as in the right base with chronic lateral basilar pleural thickening. There is a large bulla in the right upper lobe, stable.  No new opacity is evident. The heart size and pulmonary vascularity are normal. No adenopathy is apparent. No bone lesions are evident.  IMPRESSION: Extensive cicatrization as well as areas of patchy airspace consolidation, more severe on the left than on the right, and scarring throughout the lungs bilaterally. No new  opacity is evident. No adenopathy. The changes in the lungs are consistent with chronic sarcoidosis.  It should be noted that subtle acute pneumonia could easily be obscured with this degree of underlying parenchymal scarring and fibrotic type change     Assessment & Plan:  Bronchiectasis without acute exacerbation We need to get you back on an every day inhaled medication. Call your insurance company and ask them to tell you which inhaled medications they cover and what payment tier they are. We need this information to find most affordable substitute for Symbicort.  Restart Symbicort twice a day for now using samples.  Use albuterol 2 puffs as needed for shortness of breath.  Use flonase and benadryl as you are taking them .  Follow with NP in 1 month. Follow with Dr Delton CoombesByrum in 3 months or sooner if you have any problems.  OSA on CPAP Continue CPAP qhs   Chronic respiratory failure with hypoxia (HCC) Continue oxygen as ordered

## 2015-04-28 NOTE — Assessment & Plan Note (Signed)
Continue CPAP qhs 

## 2015-04-28 NOTE — Assessment & Plan Note (Signed)
Continue oxygen as ordered 

## 2015-04-28 NOTE — Patient Instructions (Addendum)
We need to get you back on an every day inhaled medication. Call your insurance company and ask them to tell you which inhaled medications they cover and what payment tier they are. We need this information to find most affordable substitute for Symbicort.  Restart Symbicort twice a day for now using samples.  Use albuterol 2 puffs as needed for shortness of breath.  Use flonase and benadryl as you are taking them .  Follow with NP in 1 month. Follow with Dr Jolly Carlini in 3 months or sooner if you have any problems. 

## 2015-04-28 NOTE — Assessment & Plan Note (Signed)
We need to get you back on an every day inhaled medication. Call your insurance company and ask them to tell you which inhaled medications they cover and what payment tier they are. We need this information to find most affordable substitute for Symbicort.  Restart Symbicort twice a day for now using samples.  Use albuterol 2 puffs as needed for shortness of breath.  Use flonase and benadryl as you are taking them .  Follow with NP in 1 month. Follow with Dr Delton CoombesByrum in 3 months or sooner if you have any problems.

## 2015-05-28 ENCOUNTER — Ambulatory Visit: Payer: Commercial Managed Care - HMO | Admitting: Acute Care

## 2015-05-29 ENCOUNTER — Ambulatory Visit: Payer: Commercial Managed Care - HMO | Admitting: Adult Health

## 2015-06-03 IMAGING — CR DG CHEST 2V
2 series · 2 of 2 positions shown · non-contrast
Comparison: Chest radiograph performed 02/11/2014

CLINICAL DATA: Acute onset of shortness of breath. For history of
sarcoidosis. Chest pain, cough and congestion. Initial encounter.

EXAM:
CHEST  2 VIEW

[w chest lat]
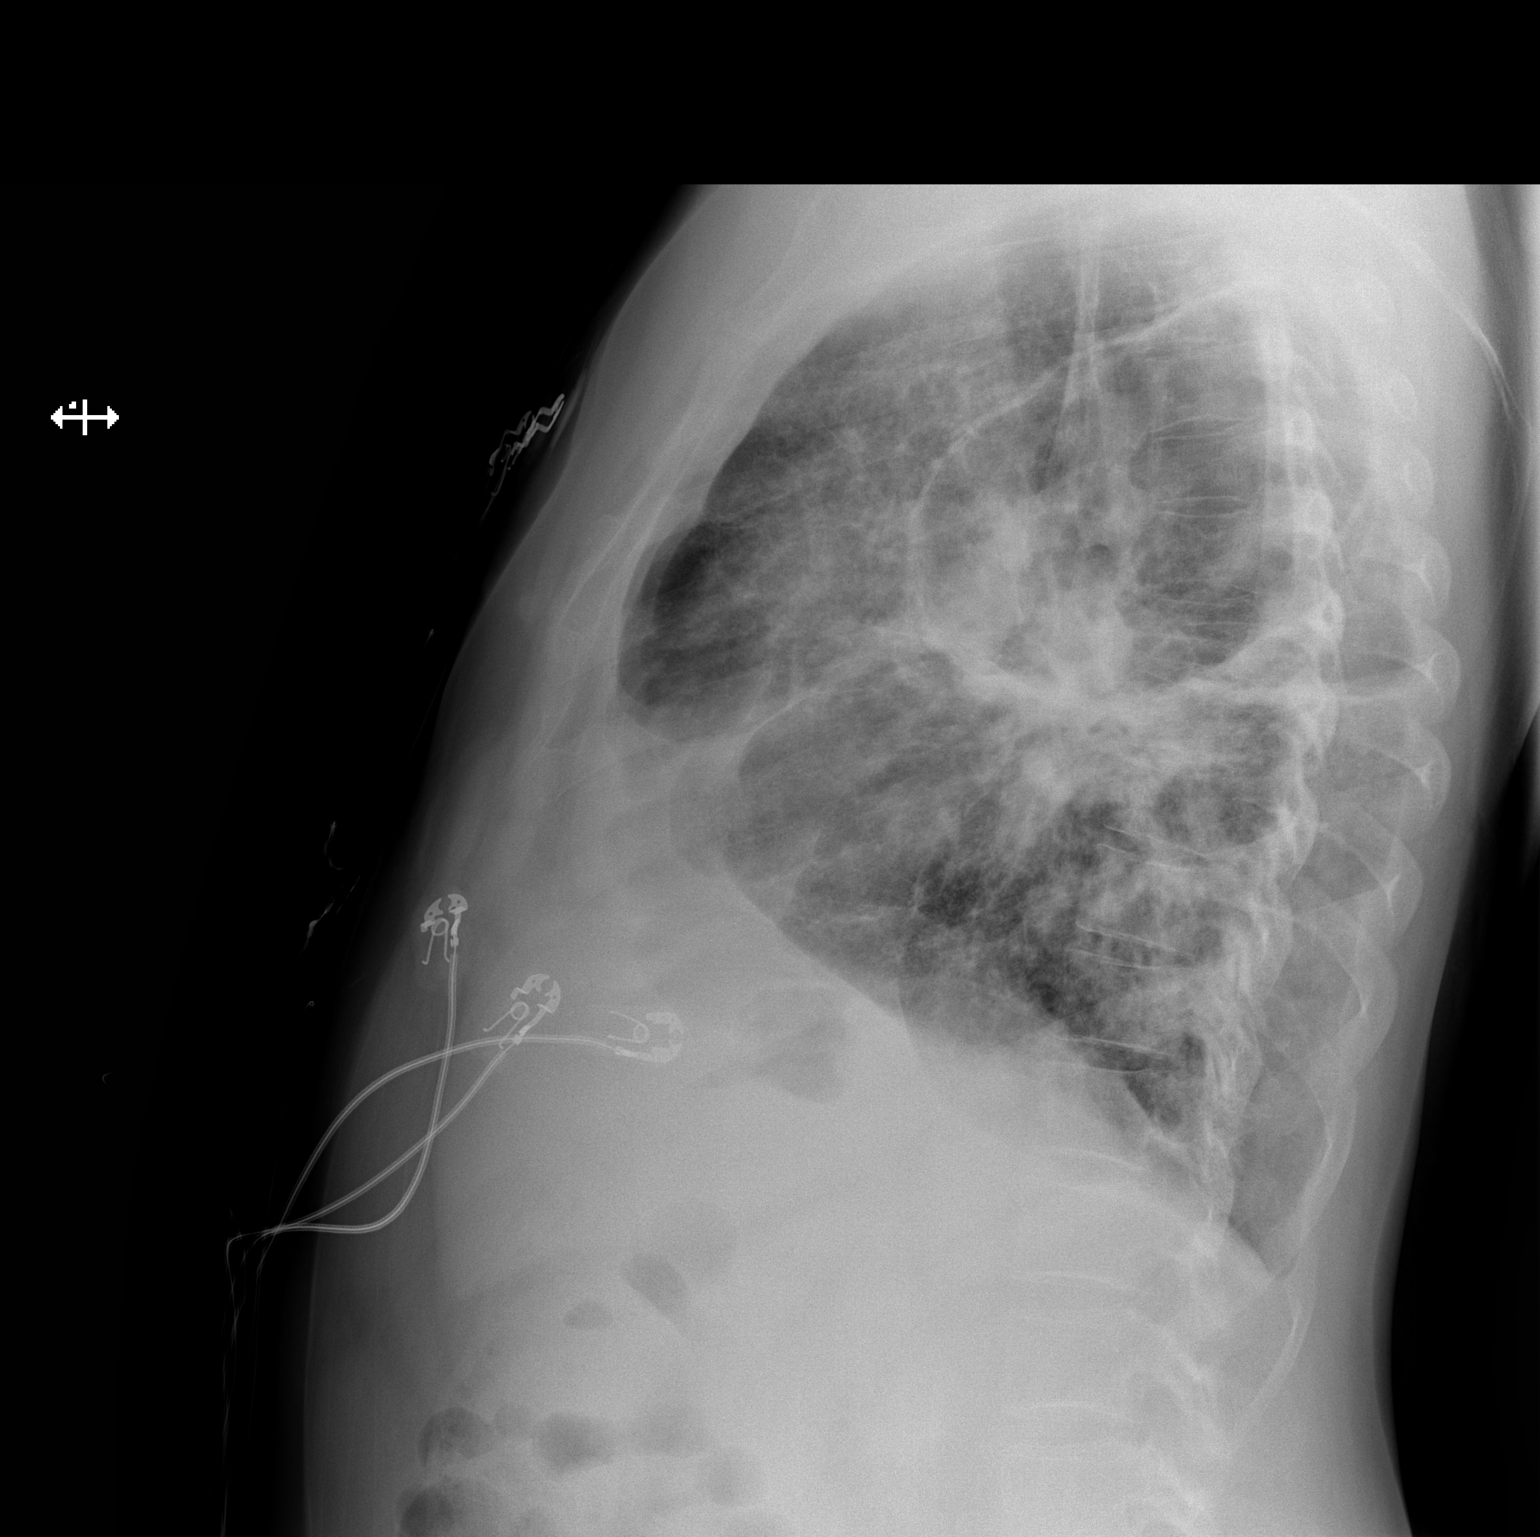

[w chest pa]
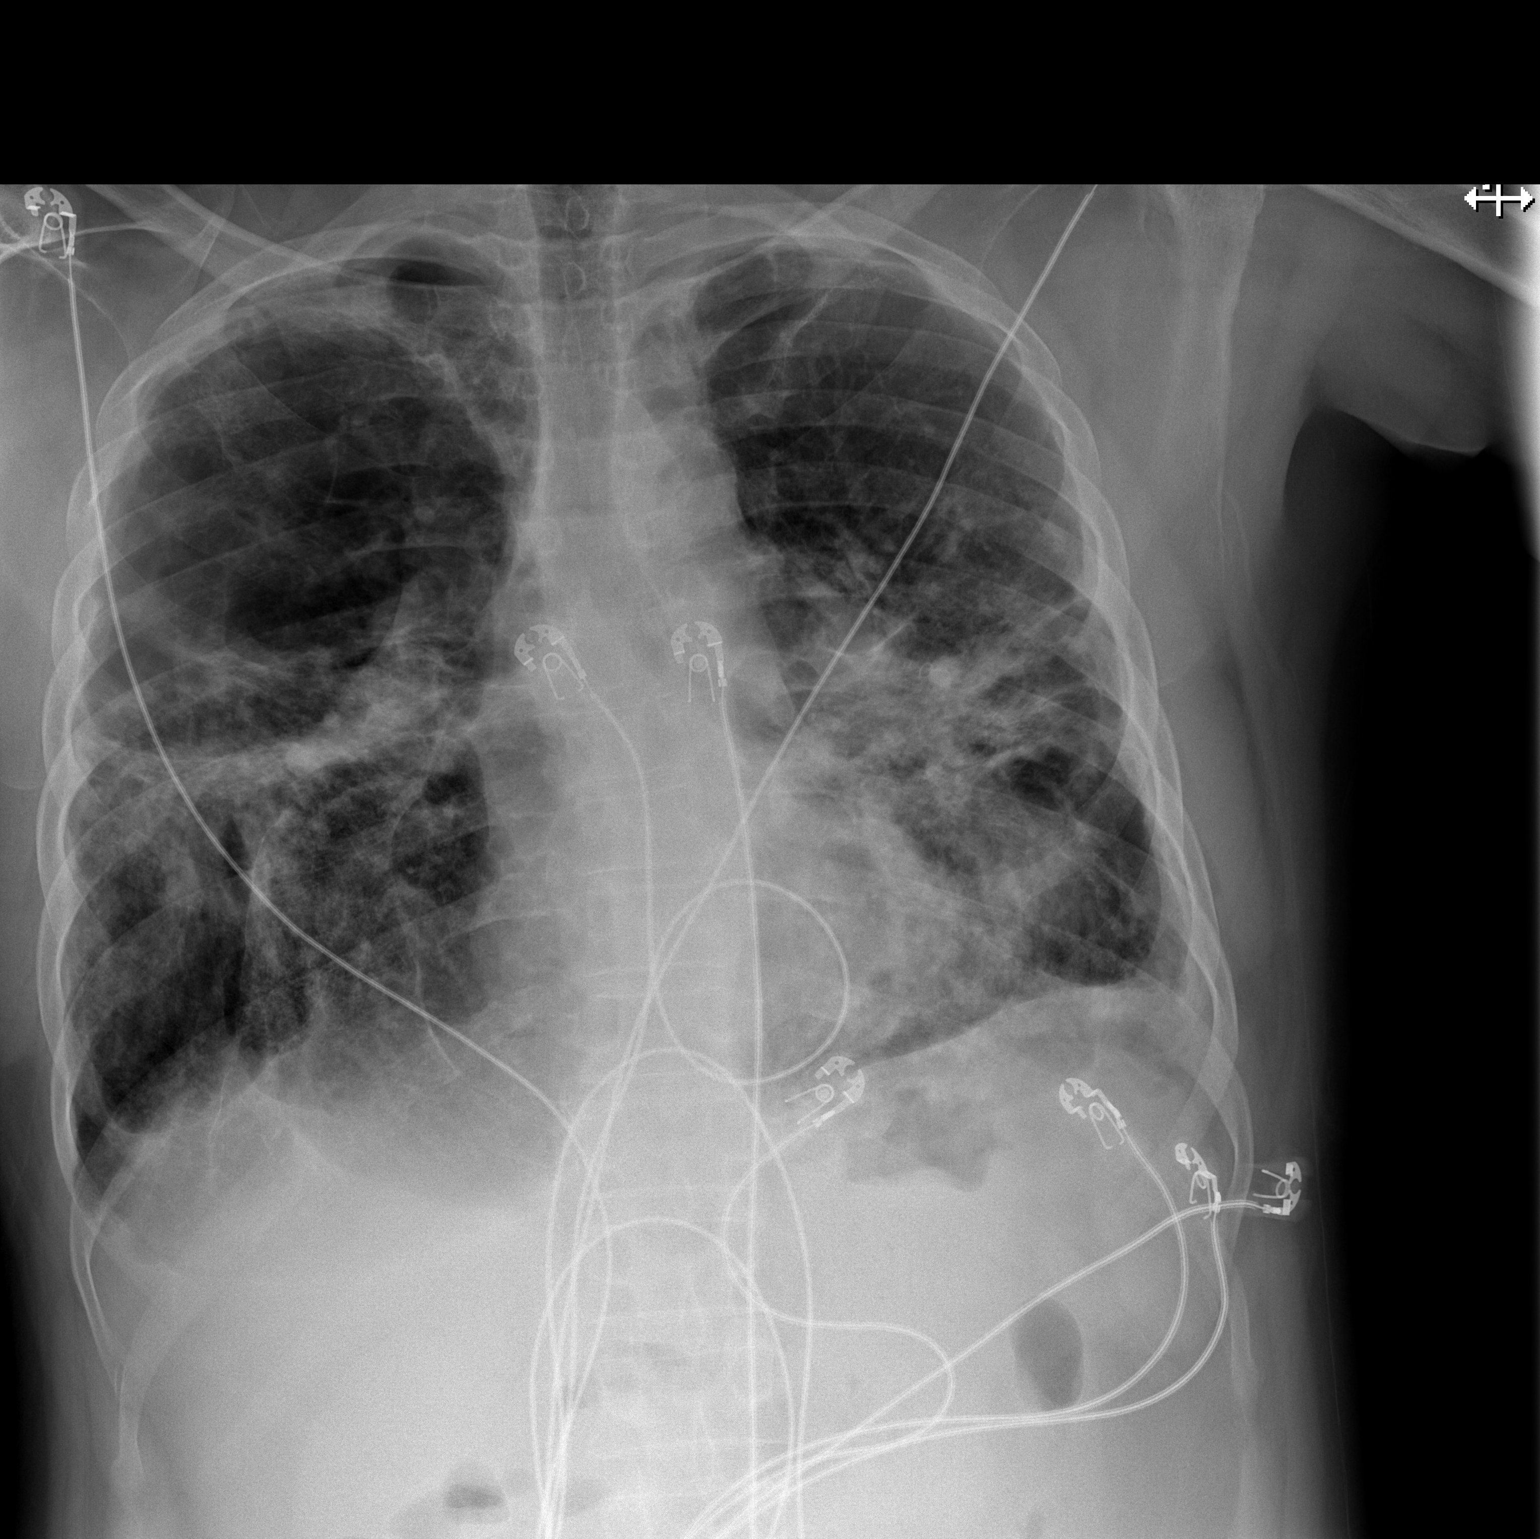

[2 of 2 positions shown; findings below may reference images not displayed]

FINDINGS: Patchy bilateral airspace opacification is similar in appearance to
the prior study and reflects the patient's known sarcoidosis. A
small left pleural effusion is suspected. No pneumothorax is seen.

No superimposed focal airspace consolidation is identified. The
cardiomediastinal silhouette is normal in size. No acute osseous
abnormalities are seen.
IMPRESSION: Stable appearance to patchy bilateral airspace opacification,
reflecting the patient's known sarcoidosis. Small left pleural
effusion suspected. No superimposed acute focal airspace
consolidation seen.

## 2015-06-04 IMAGING — DX DG ABDOMEN 1V
1 series · 1 of 1 positions shown · non-contrast
Comparison: Portable exam 2216 hr compared to 01/15/2014

CLINICAL DATA: Abdominal distension, history hypertension,
sarcoidosis, COPD

EXAM:
ABDOMEN - 1 VIEW

[abdomen kub]
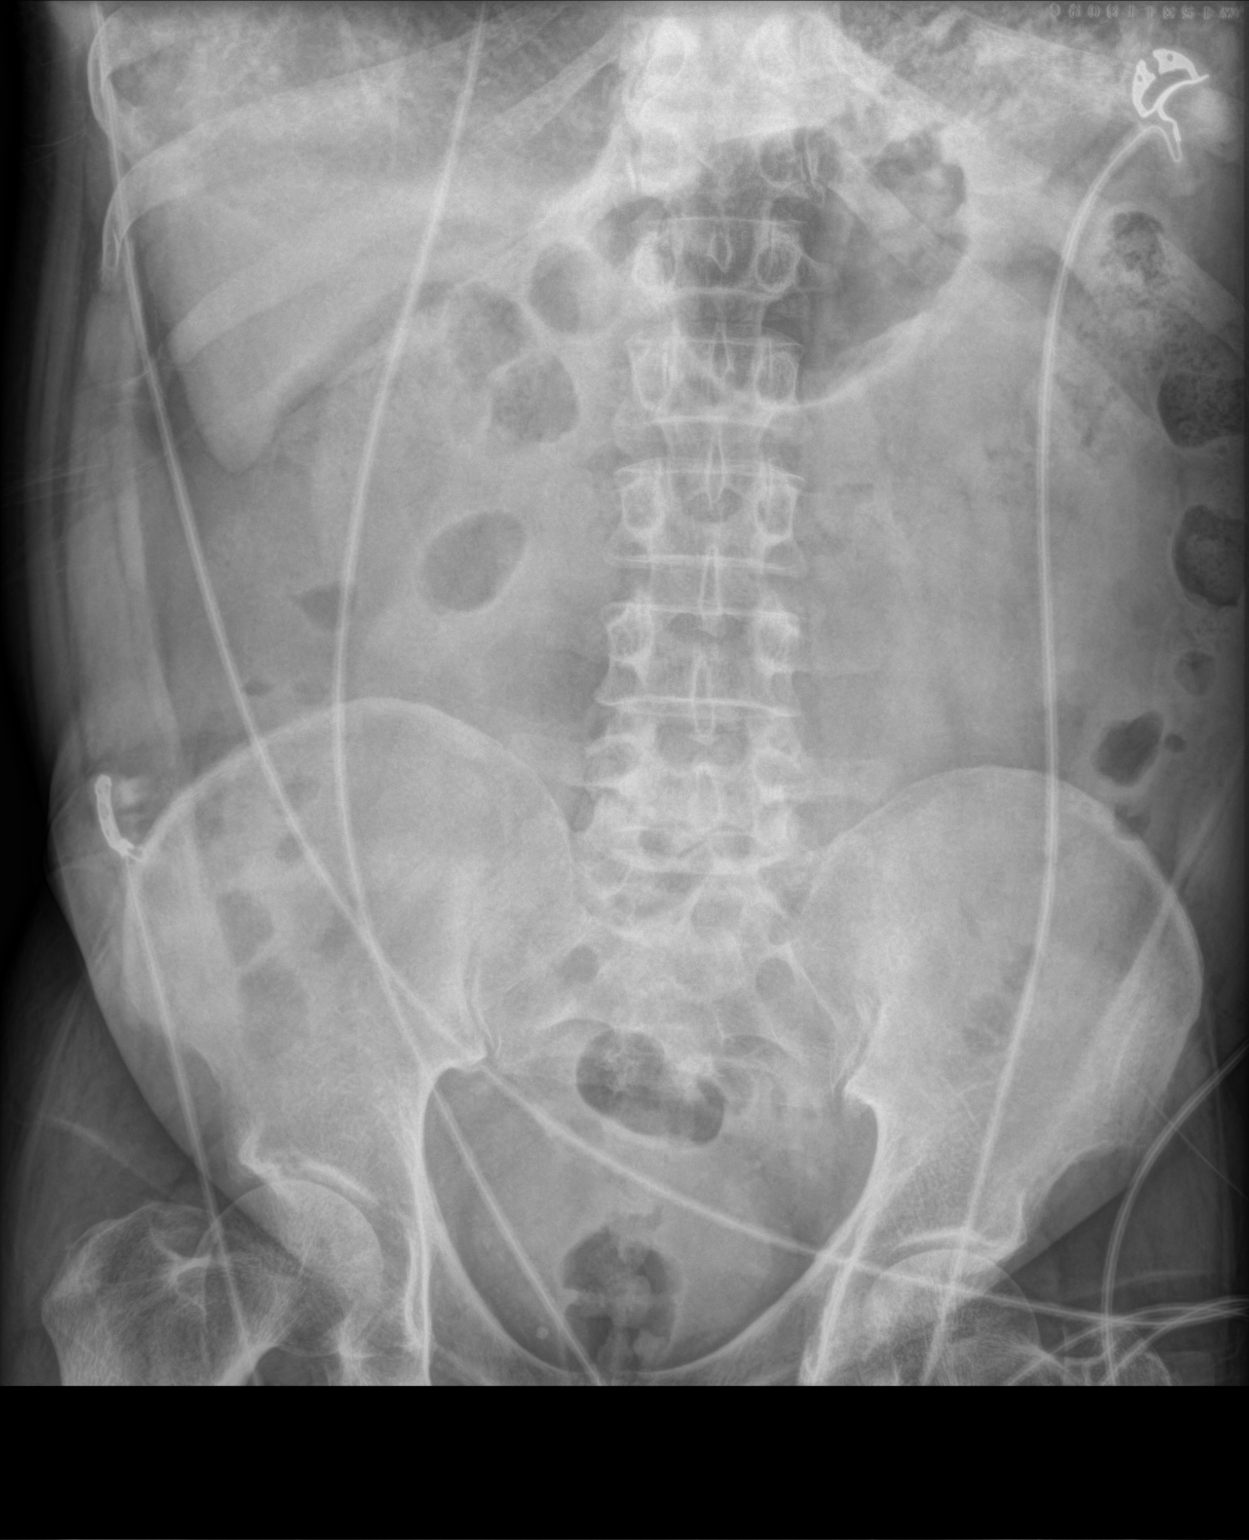

[1 of 1 positions shown; findings below may reference images not displayed]

FINDINGS: Normal bowel gas pattern.

No bowel dilatation, bowel wall thickening or evidence of
obstruction.

Increased stool burden seen within colon on previous exam resolved.

Osseous structures unremarkable.

No urinary tract calcification.
IMPRESSION: Normal bowel gas pattern.

## 2015-06-05 ENCOUNTER — Ambulatory Visit: Payer: Commercial Managed Care - HMO | Admitting: Adult Health

## 2015-06-30 ENCOUNTER — Other Ambulatory Visit: Payer: Self-pay | Admitting: Emergency Medicine

## 2015-07-21 ENCOUNTER — Encounter (HOSPITAL_COMMUNITY): Payer: Self-pay | Admitting: Emergency Medicine

## 2015-07-21 ENCOUNTER — Emergency Department (HOSPITAL_COMMUNITY): Payer: Commercial Managed Care - HMO

## 2015-07-21 ENCOUNTER — Inpatient Hospital Stay (HOSPITAL_COMMUNITY)
Admission: EM | Admit: 2015-07-21 | Discharge: 2015-07-26 | DRG: 190 | Disposition: A | Discharge status: Home / Self Care | Payer: Commercial Managed Care - HMO | Attending: Internal Medicine | Admitting: Internal Medicine

## 2015-07-21 DIAGNOSIS — K219 Gastro-esophageal reflux disease without esophagitis: Secondary | ICD-10-CM | POA: Diagnosis present

## 2015-07-21 DIAGNOSIS — Z9981 Dependence on supplemental oxygen: Secondary | ICD-10-CM

## 2015-07-21 DIAGNOSIS — J441 Chronic obstructive pulmonary disease with (acute) exacerbation: Secondary | ICD-10-CM | POA: Diagnosis present

## 2015-07-21 DIAGNOSIS — I10 Essential (primary) hypertension: Secondary | ICD-10-CM | POA: Diagnosis present

## 2015-07-21 DIAGNOSIS — Z79899 Other long term (current) drug therapy: Secondary | ICD-10-CM

## 2015-07-21 DIAGNOSIS — Z9119 Patient's noncompliance with other medical treatment and regimen: Secondary | ICD-10-CM | POA: Diagnosis not present

## 2015-07-21 DIAGNOSIS — J9621 Acute and chronic respiratory failure with hypoxia: Secondary | ICD-10-CM | POA: Diagnosis present

## 2015-07-21 DIAGNOSIS — Z833 Family history of diabetes mellitus: Secondary | ICD-10-CM

## 2015-07-21 DIAGNOSIS — J440 Chronic obstructive pulmonary disease with acute lower respiratory infection: Principal | ICD-10-CM | POA: Diagnosis present

## 2015-07-21 DIAGNOSIS — J449 Chronic obstructive pulmonary disease, unspecified: Secondary | ICD-10-CM

## 2015-07-21 DIAGNOSIS — D869 Sarcoidosis, unspecified: Secondary | ICD-10-CM | POA: Diagnosis present

## 2015-07-21 DIAGNOSIS — G4733 Obstructive sleep apnea (adult) (pediatric): Secondary | ICD-10-CM | POA: Diagnosis present

## 2015-07-21 DIAGNOSIS — R0602 Shortness of breath: Secondary | ICD-10-CM | POA: Diagnosis not present

## 2015-07-21 LAB — BASIC METABOLIC PANEL
ANION GAP: 9 (ref 5–15)
BUN: 10 mg/dL (ref 6–20)
CHLORIDE: 99 mmol/L — AB (ref 101–111)
CO2: 31 mmol/L (ref 22–32)
Calcium: 9.3 mg/dL (ref 8.9–10.3)
Creatinine, Ser: 1 mg/dL (ref 0.61–1.24)
GFR calc non Af Amer: >60 mL/min (ref >60)
GLUCOSE: 86 mg/dL (ref 65–99)
POTASSIUM: 3.8 mmol/L (ref 3.5–5.1)
Sodium: 139 mmol/L (ref 135–145)

## 2015-07-21 LAB — CBC
HEMATOCRIT: 39.1 % (ref 39.0–52.0)
HEMOGLOBIN: 12.8 g/dL — AB (ref 13.0–17.0)
MCH: 27.1 pg (ref 26.0–34.0)
MCHC: 32.7 g/dL (ref 30.0–36.0)
MCV: 82.7 fL (ref 78.0–100.0)
Platelets: 269 10*3/uL (ref 150–400)
RBC: 4.73 MIL/uL (ref 4.22–5.81)
RDW: 12.7 % (ref 11.5–15.5)
WBC: 5.2 10*3/uL (ref 4.0–10.5)

## 2015-07-21 MED ORDER — DIPHENHYDRAMINE HCL 25 MG PO CAPS: 25.0000 mg | ORAL_CAPSULE | Freq: Four times a day (QID) | ORAL | Status: DC | PRN

## 2015-07-21 MED ORDER — CALCIUM CARBONATE-VITAMIN D 500-200 MG-UNIT PO TABS
1.0000 | ORAL_TABLET | Freq: Every morning | ORAL | Status: DC
Start: 2015-07-22 — End: 2015-07-26
  Administered 2015-07-22 – 2015-07-25 (×4): 1 via ORAL
  Filled 2015-07-21 (×6): qty 1

## 2015-07-21 MED ORDER — PANTOPRAZOLE SODIUM 40 MG PO TBEC
40.0000 mg | DELAYED_RELEASE_TABLET | Freq: Every day | ORAL | Status: DC
  Administered 2015-07-22 – 2015-07-25 (×4): 40 mg via ORAL
  Filled 2015-07-21 (×5): qty 1

## 2015-07-21 MED ORDER — ALBUTEROL SULFATE (2.5 MG/3ML) 0.083% IN NEBU
2.5000 mg | INHALATION_SOLUTION | Freq: Four times a day (QID) | RESPIRATORY_TRACT | Status: DC
  Administered 2015-07-21: 2.5 mg via RESPIRATORY_TRACT
  Filled 2015-07-21: qty 3

## 2015-07-21 MED ORDER — ALBUTEROL SULFATE (2.5 MG/3ML) 0.083% IN NEBU
2.5000 mg | INHALATION_SOLUTION | RESPIRATORY_TRACT | Status: DC | PRN
  Administered 2015-07-22 – 2015-07-24 (×3): 2.5 mg via RESPIRATORY_TRACT
  Filled 2015-07-21 (×3): qty 3

## 2015-07-21 MED ORDER — ALBUTEROL SULFATE (2.5 MG/3ML) 0.083% IN NEBU: 2.5000 mg | INHALATION_SOLUTION | RESPIRATORY_TRACT | Status: DC | PRN

## 2015-07-21 MED ORDER — ENOXAPARIN SODIUM 40 MG/0.4ML ~~LOC~~ SOLN
40.0000 mg | SUBCUTANEOUS | Status: DC
  Administered 2015-07-21 – 2015-07-25 (×5): 40 mg via SUBCUTANEOUS
  Filled 2015-07-21 (×5): qty 0.4

## 2015-07-21 MED ORDER — LEVOFLOXACIN IN D5W 750 MG/150ML IV SOLN
750.0000 mg | INTRAVENOUS | Status: DC
  Administered 2015-07-21: 750 mg via INTRAVENOUS
  Filled 2015-07-21: qty 150

## 2015-07-21 MED ORDER — BISACODYL 5 MG PO TBEC: 5.0000 mg | DELAYED_RELEASE_TABLET | Freq: Every day | ORAL | Status: DC | PRN

## 2015-07-21 MED ORDER — SODIUM CHLORIDE 0.9% FLUSH: 3.0000 mL | INTRAVENOUS | Status: DC | PRN

## 2015-07-21 MED ORDER — FLUTICASONE PROPIONATE 50 MCG/ACT NA SUSP
1.0000 | Freq: Two times a day (BID) | NASAL | Status: DC | PRN
  Administered 2015-07-22: 1 via NASAL
  Filled 2015-07-21: qty 16

## 2015-07-21 MED ORDER — FERROUS SULFATE 325 (65 FE) MG PO TABS
325.0000 mg | ORAL_TABLET | Freq: Two times a day (BID) | ORAL | Status: DC
  Administered 2015-07-21 – 2015-07-26 (×10): 325 mg via ORAL
  Filled 2015-07-21 (×10): qty 1

## 2015-07-21 MED ORDER — METHYLPREDNISOLONE SODIUM SUCC 125 MG IJ SOLR
125.0000 mg | Freq: Once | INTRAMUSCULAR | Status: AC
  Administered 2015-07-21: 125 mg via INTRAVENOUS
  Filled 2015-07-21: qty 2

## 2015-07-21 MED ORDER — METHYLPREDNISOLONE SODIUM SUCC 40 MG IJ SOLR
40.0000 mg | Freq: Two times a day (BID) | INTRAMUSCULAR | Status: DC
  Administered 2015-07-21 – 2015-07-22 (×2): 40 mg via INTRAVENOUS
  Filled 2015-07-21 (×2): qty 1

## 2015-07-21 MED ORDER — BISOPROLOL FUMARATE 10 MG PO TABS
10.0000 mg | ORAL_TABLET | Freq: Every day | ORAL | Status: DC
  Administered 2015-07-21 – 2015-07-25 (×5): 10 mg via ORAL
  Filled 2015-07-21 (×5): qty 1

## 2015-07-21 MED ORDER — SODIUM CHLORIDE 0.9% FLUSH
3.0000 mL | Freq: Two times a day (BID) | INTRAVENOUS | Status: DC
Start: 2015-07-21 — End: 2015-07-26
  Administered 2015-07-21 – 2015-07-25 (×7): 3 mL

## 2015-07-21 MED ORDER — ONDANSETRON HCL 4 MG PO TABS
4.0000 mg | ORAL_TABLET | Freq: Three times a day (TID) | ORAL | Status: DC | PRN
Start: 2015-07-21 — End: 2015-07-26
  Administered 2015-07-23: 4 mg via ORAL
  Filled 2015-07-21: qty 1

## 2015-07-21 MED ORDER — IBUPROFEN 200 MG PO TABS
600.0000 mg | ORAL_TABLET | Freq: Four times a day (QID) | ORAL | Status: DC | PRN
  Administered 2015-07-21 – 2015-07-25 (×4): 600 mg via ORAL
  Filled 2015-07-21 (×5): qty 3

## 2015-07-21 MED ORDER — MAGNESIUM CITRATE PO SOLN: 1.0000 | Freq: Every day | ORAL | Status: DC | PRN

## 2015-07-21 MED ORDER — DILTIAZEM HCL ER COATED BEADS 120 MG PO CP24
120.0000 mg | ORAL_CAPSULE | Freq: Every day | ORAL | Status: DC
  Administered 2015-07-22 – 2015-07-25 (×4): 120 mg via ORAL
  Filled 2015-07-21 (×5): qty 1

## 2015-07-21 MED ORDER — BENZONATATE 100 MG PO CAPS
100.0000 mg | ORAL_CAPSULE | Freq: Three times a day (TID) | ORAL | Status: DC | PRN
  Administered 2015-07-21 – 2015-07-23 (×4): 100 mg via ORAL
  Filled 2015-07-21 (×4): qty 1

## 2015-07-21 MED ORDER — CYCLOBENZAPRINE HCL 10 MG PO TABS: 10.0000 mg | ORAL_TABLET | Freq: Three times a day (TID) | ORAL | Status: DC | PRN

## 2015-07-21 MED ORDER — GUAIFENESIN ER 600 MG PO TB12
600.0000 mg | ORAL_TABLET | Freq: Two times a day (BID) | ORAL | Status: DC
  Administered 2015-07-21 – 2015-07-25 (×9): 600 mg via ORAL
  Filled 2015-07-21 (×9): qty 1

## 2015-07-21 MED ORDER — SODIUM CHLORIDE 0.9 % IV SOLN
INTRAVENOUS | Status: DC
  Administered 2015-07-21: 11:00:00 via INTRAVENOUS

## 2015-07-21 MED ORDER — CHOLECALCIFEROL 10 MCG (400 UNIT) PO TABS
400.0000 [IU] | ORAL_TABLET | Freq: Every day | ORAL | Status: DC
  Administered 2015-07-21 – 2015-07-25 (×5): 400 [IU] via ORAL
  Filled 2015-07-21 (×7): qty 1

## 2015-07-21 MED ORDER — IPRATROPIUM-ALBUTEROL 0.5-2.5 (3) MG/3ML IN SOLN
3.0000 mL | Freq: Four times a day (QID) | RESPIRATORY_TRACT | Status: DC
  Administered 2015-07-22 – 2015-07-26 (×17): 3 mL via RESPIRATORY_TRACT
  Filled 2015-07-21 (×17): qty 3

## 2015-07-21 MED ORDER — ADULT MULTIVITAMIN W/MINERALS CH
1.0000 | ORAL_TABLET | Freq: Every morning | ORAL | Status: DC
  Administered 2015-07-22 – 2015-07-25 (×4): 1 via ORAL
  Filled 2015-07-21 (×5): qty 1

## 2015-07-21 MED ORDER — TIOTROPIUM BROMIDE MONOHYDRATE 18 MCG IN CAPS
18.0000 ug | ORAL_CAPSULE | Freq: Every day | RESPIRATORY_TRACT | Status: DC
  Filled 2015-07-21: qty 5

## 2015-07-21 MED ORDER — SODIUM CHLORIDE 0.9 % IV SOLN: 250.0000 mL | INTRAVENOUS | Status: DC | PRN

## 2015-07-21 MED ORDER — VITAMIN D2 10 MCG (400 UNIT) PO TABS: 400.0000 [IU] | ORAL_TABLET | Freq: Every day | ORAL | Status: DC

## 2015-07-21 MED ORDER — ALBUTEROL (5 MG/ML) CONTINUOUS INHALATION SOLN
10.0000 mg/h | INHALATION_SOLUTION | Freq: Once | RESPIRATORY_TRACT | Status: AC
  Administered 2015-07-21: 10 mg/h via RESPIRATORY_TRACT
  Filled 2015-07-21: qty 20

## 2015-07-21 MED ORDER — DOXYCYCLINE HYCLATE 100 MG IV SOLR
100.0000 mg | Freq: Two times a day (BID) | INTRAVENOUS | Status: DC
  Administered 2015-07-21 – 2015-07-25 (×9): 100 mg via INTRAVENOUS
  Filled 2015-07-21 (×10): qty 100

## 2015-07-21 MED ORDER — IPRATROPIUM BROMIDE 0.02 % IN SOLN
0.5000 mg | Freq: Once | RESPIRATORY_TRACT | Status: AC
  Administered 2015-07-21: 0.5 mg via RESPIRATORY_TRACT
  Filled 2015-07-21: qty 2.5

## 2015-07-21 NOTE — ED Notes (Signed)
Pts girlfriend Chilton SiJacqueline Paylor [phone]

## 2015-07-21 NOTE — Progress Notes (Signed)
Pharmacy Antibiotic Note  [name] is a [age] y.o. male admitted on 07/21/2015 with pneumonia/COPD exacerbation.  Pharmacy has been consulted for levofloxacin dosing. Patient c/o productive cough w/o any changes to CXR.  Patient with h/o sarcoidosis.  Today, 07/21/2015  Renal: SCr WNL  WBC WNL  afebrile  Plan:  Levofloxacin 750mg  IV q24h   Do not anticipate will required dose adjustment based on current renal function, pharmacy will sign-off at this time.  Height: 5\' 7"  (170.2 cm) Weight: 167 lb (75.751 kg) IBW/kg (Calculated) : 66.1  Temp (24hrs), Avg:97.9 F (36.6 C), Min:97.9 F (36.6 C), Max:97.9 F (36.6 C)   Recent Labs Lab 07/21/15 1121  WBC 5.2  CREATININE 1.00    Estimated Creatinine Clearance: 82.6 mL/min (by C-G formula based on Cr of 1).    Allergies  Allergen Reactions  . Clindamycin/Lincomycin Anaphylaxis and Swelling    Made face and tongue swell    Antimicrobials this admission: 4/10  >> levofloxacin  Dose adjustments this admission:  Microbiology results: No cultures  Thank you for allowing pharmacy to be a part of this patient's care.  Juliette Alcideustin Shriya Aker, PharmD, BCPS.   Pager: [phone] 07/21/2015 1:31 PM

## 2015-07-21 NOTE — ED Notes (Signed)
Bed: WA13 Expected date:  Expected time:  Means of arrival:  Comments: Tr 1 

## 2015-07-21 NOTE — ED Provider Notes (Signed)
CSN: [id number]     Arrival date & time 07/21/15  4098 History   First MD Initiated Contact with Patient 07/21/15 1033     Chief Complaint  Patient presents with  . Shortness of Breath   HPI Pt started having increasing shortness of breath a few days ago.  At baseline he has shortness of breath associated with COPD and sarcoidosis and is normally on 4l.  It has been getting worse last few days.  Increased cough with yellow sputum production.   NO fevers measured but he has felt hot.   He has pain in the left more than right chest when coughing and bending over or if he laughs.    No vomiting or diarrhea.  No leg swelling.  Pt sees Dr Delton Coombes pulmonary for his chronic lung disease.  Last seen in April. Past Medical History  Diagnosis Date  . Hypertension   . Sarcoidosis (HCC)   . OSA on CPAP   . Chronic respiratory failure (HCC)     home O@ 4L (01/28/14)  . Bronchitis   . Pneumonia   . COPD (chronic obstructive pulmonary disease) (HCC)   . Neuromuscular disorder [facility])    Past Surgical History  Procedure Laterality Date  . Rotator cuff repair    . Arm surgery     . Video assisted thoracoscopy (vats)/thorocotomy Left 07/17/2012    Procedure: VIDEO ASSISTED THORACOSCOPY (VATS)/BLEB Stapling;  Surgeon: Alleen Borne, MD;  Location: [facility] OR;  Service: Thoracic;  Laterality: Left;   Family History  Problem Relation Age of Onset  . Diabetes Father    Social History  Substance Use Topics  . Smoking status: Never Smoker   . Smokeless tobacco: Never Used  . Alcohol Use: Yes     Comment: social    Review of Systems  All other systems reviewed and are negative.     Allergies  Clindamycin/lincomycin  Home Medications   Prior to Admission medications   Medication Sig Start Date End Date Taking? Authorizing Provider  albuterol (PROVENTIL) (2.5 MG/3ML) 0.083% nebulizer solution Take 2.5 mg by nebulization every 4 (four) hours as needed for wheezing or shortness of breath (wheezing and  shortness of breath).  06/08/12  Yes Historical Provider, MD  bisacodyl (BISACODYL) 5 MG EC tablet Take 1 tablet (5 mg total) by mouth daily as needed for moderate constipation. 01/16/14  Yes Simonne Martinet, NP  bisoprolol (ZEBETA) 10 MG tablet Take 1 tablet (10 mg total) by mouth daily. 03/26/14  Yes Maryann Mikhail, DO  calcium-vitamin D (OSCAL WITH D) 500-200 MG-UNIT per tablet Take 1 tablet by mouth every morning.   Yes Historical Provider, MD  cyclobenzaprine (FLEXERIL) 10 MG tablet Take 10 mg by mouth 3 (three) times daily as needed for muscle spasms (muscle spasms).    Yes Historical Provider, MD  diltiazem (CARDIZEM CD) 120 MG 24 hr capsule Take 1 capsule (120 mg total) by mouth daily. 01/28/14  Yes Jeanella Craze, NP  diphenhydrAMINE (BENADRYL) 25 MG tablet Take 25 mg by mouth every 6 (six) hours as needed for allergies (allergies).    Yes Historical Provider, MD  Ergocalciferol (VITAMIN D2) 400 units TABS Take 400 Units by mouth daily. 03/18/08  Yes Historical Provider, MD  ferrous sulfate 325 (65 FE) MG tablet Take 1 tablet (325 mg total) by mouth 2 (two) times daily with a meal. 06/18/12  Yes Leroy Sea, MD  fluticasone (FLONASE) 50 MCG/ACT nasal spray Place 2 sprays into both nostrils  2 (two) times daily. Patient taking differently: Place 2 sprays into both nostrils 2 (two) times daily as needed for allergies.  01/28/14  Yes Jeanella Craze, NP  guaiFENesin (MUCINEX) 600 MG 12 hr tablet Take 1 tablet (600 mg total) by mouth 2 (two) times daily. 01/16/15  Yes Nishant Dhungel, MD  magnesium citrate SOLN Take 1 Bottle by mouth daily as needed for mild constipation or severe constipation.   Yes Historical Provider, MD  Multiple Vitamins-Minerals (MULTIVITAMIN WITH MINERALS) tablet Take 1 tablet by mouth every morning.    Yes Historical Provider, MD  omeprazole (PRILOSEC) 40 MG capsule Take 40 mg by mouth daily. 11/04/14  Yes Historical Provider, MD  ondansetron (ZOFRAN) 4 MG tablet Take 4 mg by  mouth every 8 (eight) hours as needed for nausea or vomiting.  09/05/14  Yes Historical Provider, MD  polyethylene glycol (MIRALAX / GLYCOLAX) packet Take 17 g by mouth daily. Patient taking differently: Take 17 g by mouth daily as needed for mild constipation or moderate constipation.  01/16/14  Yes Simonne Martinet, NP  VENTOLIN HFA 108 (90 Base) MCG/ACT inhaler INHALE TWO PUFFS BY MOUTH EVERY 6 HOURS AS NEEDED FOR WHEEZING OR SHORTNESS OF BREATH 06/30/15  Yes Leslye Peer, MD  doxycycline (VIBRAMYCIN) 100 MG capsule Take 1 capsule (100 mg total) by mouth 2 (two) times daily. One po bid x 7 days Patient not taking: Reported on 07/21/2015 04/25/15   Dione Booze, MD  predniSONE (DELTASONE) 50 MG tablet Take 1 tablet (50 mg total) by mouth daily. Patient not taking: Reported on 07/21/2015 04/25/15   Dione Booze, MD   BP 137/97 mmHg  Pulse 107  Temp(Src) 97.9 F (36.6 C) (Oral)  Resp 24  Ht 5\' 7"  (1.702 m)  Wt 75.751 kg  BMI 26.15 kg/m2  SpO2 100% Physical Exam  Constitutional: He appears well-developed and well-nourished. No distress.  HENT:  Head: Normocephalic and atraumatic.  Right Ear: External ear normal.  Left Ear: External ear normal.  Eyes: Conjunctivae are normal. Right eye exhibits no discharge. Left eye exhibits no discharge. No scleral icterus.  Neck: Neck supple. No tracheal deviation present.  Cardiovascular: Normal rate, regular rhythm and intact distal pulses.   Pulmonary/Chest: Effort normal. No stridor. No respiratory distress. He has decreased breath sounds in the left upper field, the left middle field and the left lower field. He has wheezes (few bilaterally). He has no rales.  Abdominal: Soft. Bowel sounds are normal. He exhibits no distension. There is no tenderness. There is no rebound and no guarding.  Musculoskeletal: He exhibits no edema or tenderness.  Neurological: He is alert. He has normal strength. No cranial nerve deficit (no facial droop, extraocular movements  intact, no slurred speech) or sensory deficit. He exhibits normal muscle tone. He displays no seizure activity. Coordination normal.  Skin: Skin is warm and dry. No rash noted.  Psychiatric: He has a normal mood and affect.  Nursing note and vitals reviewed.   ED Course  Procedures (including critical care time) Labs Review Labs Reviewed  BASIC METABOLIC PANEL - Abnormal; Notable for the following:    Chloride 99 (*)    All other components within normal limits  CBC - Abnormal; Notable for the following:    Hemoglobin 12.8 (*)    All other components within normal limits    Imaging Review Dg Chest 2 View  07/21/2015  CLINICAL DATA:  History of sarcoidosis.  Difficulty breathing EXAM: CHEST  2 VIEW COMPARISON:  April 24, 2015 FINDINGS: Widespread cicatrization is noted, stable. There are areas of probable chronic mucous plugging with patchy opacity throughout the lungs, most severe in the mid lower lung zones, stable. There is no new opacity appreciable. The heart size and pulmonary vascularity are normal. No adenopathy is demonstrable. No bone lesions. IMPRESSION: No appreciable change from study performed 3 months prior. Extensive scarring and areas of probable chronic mucous plugging. No new opacity. No change in cardiac silhouette. These changes are consistent with known history of sarcoidosis. It should be noted that subtle pneumonia could easily be obscured given the degree of scarring and opacity present currently. Electronically Signed   By: Bretta BangWilliam  Woodruff III M.D.   On: 07/21/2015 10:25   I have personally reviewed and evaluated these images and lab results as part of my medical decision-making.   EKG Interpretation   Date/Time:  Monday July 21 2015 10:13:01 EDT Ventricular Rate:  108 PR Interval:    QRS Duration: 77 QT Interval:  338 QTC Calculation: 453 R Axis:   98 Text Interpretation:  Sinus tachycardia Probable left atrial enlargement  Borderline right axis  deviation Borderline T wave abnormalities Baseline  wander in lead(s) V2 No significant change since last tracing Confirmed by  Demetrice Amstutz  MD-J, Trevante Tennell ([id number]) on 07/21/2015 1:14:16 PM       Medications given in the ED Medications  0.9 %  sodium chloride infusion ( Intravenous New Bag/Given 07/21/15 1118)  albuterol (PROVENTIL,VENTOLIN) solution continuous neb (10 mg/hr Nebulization Given 07/21/15 1114)  ipratropium (ATROVENT) nebulizer solution 0.5 mg (0.5 mg Nebulization Given 07/21/15 1114)  methylPREDNISolone sodium succinate (SOLU-MEDROL) 125 mg/2 mL injection 125 mg (125 mg Intravenous Given 07/21/15 1118)     MDM   Final diagnoses:  Sarcoidosis (HCC)  Chronic obstructive pulmonary disease, unspecified COPD type (HCC)    Patient was treated in the emergency room with breathing treatments and steroids. He has not noticed much improvement. Patient continues to feel short of breath.  Patient has history of chronic lung disease associated with sarcoidosis. At baseline his chest x-ray is very abnormal. It's possible that he may have a component of pneumonia today exacerbating his symptoms although he does not have a fever and his white blood cell count is not elevated. It is possible this may all be a COPD exacerbation.  With his persistent symptoms, I will consult the medical service for admission and further treatment.  Empiric abx for purulent bronchitis.  Pharmacy consult for levaquin.    Linwood DibblesJon Mariem Skolnick, MD 07/21/15 1316

## 2015-07-21 NOTE — Progress Notes (Signed)
Received report from ED, Pt arrived unit, alert and oriented, able to communicate needs. MD notified of Pt's location. Will continue with current plan of care.

## 2015-07-21 NOTE — ED Notes (Signed)
RRT PRESENT 

## 2015-07-21 NOTE — ED Notes (Signed)
When patient first came in he was on O2 @ 4liters and stating 75%-80 % patient placed O2 @ 4 liters and coached to breathe O2 stats rose to 100%

## 2015-07-21 NOTE — H&P (Signed)
History and Physical    [name] Baumannommy Paulino ZOX:[id number] DOB: 12/31/1965 DOA: 07/21/2015  PCP: Clydie BraunENIZARD-THOMPSON, NANCY, MD  Patient coming from: home  Chief Complaint: sob, doe  HPI: [name] is a 50 y.o. male with medical history significant of copd on home oxygen, gerd, sarcoidosis, HTN. Presenting with SOB, doe, and increase complaints of cough and sputum production. He denies any fevers. States this is been going on for the last several days. The problem has been persistent. The problem has been getting worse. Nothing he is aware of makes it better he gets minimal relief with his albuterol. While in house the albuterol administered in the ED has helped.  ED Course: Patient was given Solu-Medrol and albuterol. Patient reports improvement with this. Chest x-ray shows scar tissue and reports that pneumonia may be easily obscured as a result of scar tissue.  Review of Systems: As per HPI otherwise 10 point review of systems negative.    Past Medical History  Diagnosis Date  . Hypertension   . Sarcoidosis (HCC)   . OSA on CPAP   . Chronic respiratory failure (HCC)     home O@ 4L (01/28/14)  . Bronchitis   . Pneumonia   . COPD (chronic obstructive pulmonary disease) (HCC)   . Neuromuscular disorder [facility](HCC)     Past Surgical History  Procedure Laterality Date  . Rotator cuff repair    . Arm surgery     . Video assisted thoracoscopy (vats)/thorocotomy Left 07/17/2012    Procedure: VIDEO ASSISTED THORACOSCOPY (VATS)/BLEB Stapling;  Surgeon: Alleen BorneBryan K Bartle, MD;  Location: [facility] OR;  Service: Thoracic;  Laterality: Left;     reports that he has never smoked. He has never used smokeless tobacco. He reports that he drinks alcohol. He reports that he does not use illicit drugs.  Allergies  Allergen Reactions  . Clindamycin/Lincomycin Anaphylaxis and Swelling    Made face and tongue swell    Family History  Problem Relation Age of Onset  . Diabetes Father      Prior to Admission  medications   Medication Sig Start Date End Date Taking? Authorizing Provider  albuterol (PROVENTIL) (2.5 MG/3ML) 0.083% nebulizer solution Take 2.5 mg by nebulization every 4 (four) hours as needed for wheezing or shortness of breath (wheezing and shortness of breath).  06/08/12  Yes Historical Provider, MD  bisacodyl (BISACODYL) 5 MG EC tablet Take 1 tablet (5 mg total) by mouth daily as needed for moderate constipation. 01/16/14  Yes Simonne MartinetPeter E Babcock, NP  bisoprolol (ZEBETA) 10 MG tablet Take 1 tablet (10 mg total) by mouth daily. 03/26/14  Yes Maryann Mikhail, DO  calcium-vitamin D (OSCAL WITH D) 500-200 MG-UNIT per tablet Take 1 tablet by mouth every morning.   Yes Historical Provider, MD  cyclobenzaprine (FLEXERIL) 10 MG tablet Take 10 mg by mouth 3 (three) times daily as needed for muscle spasms (muscle spasms).    Yes Historical Provider, MD  diltiazem (CARDIZEM CD) 120 MG 24 hr capsule Take 1 capsule (120 mg total) by mouth daily. 01/28/14  Yes Jeanella CrazeBrandi L Ollis, NP  diphenhydrAMINE (BENADRYL) 25 MG tablet Take 25 mg by mouth every 6 (six) hours as needed for allergies (allergies).    Yes Historical Provider, MD  Ergocalciferol (VITAMIN D2) 400 units TABS Take 400 Units by mouth daily. 03/18/08  Yes Historical Provider, MD  ferrous sulfate 325 (65 FE) MG tablet Take 1 tablet (325 mg total) by mouth 2 (two) times daily with a meal. 06/18/12  Yes Prashant  Curlene Labrum, MD  fluticasone (FLONASE) 50 MCG/ACT nasal spray Place 2 sprays into both nostrils 2 (two) times daily. Patient taking differently: Place 2 sprays into both nostrils 2 (two) times daily as needed for allergies.  01/28/14  Yes Jeanella Craze, NP  guaiFENesin (MUCINEX) 600 MG 12 hr tablet Take 1 tablet (600 mg total) by mouth 2 (two) times daily. 01/16/15  Yes Nishant Dhungel, MD  magnesium citrate SOLN Take 1 Bottle by mouth daily as needed for mild constipation or severe constipation.   Yes Historical Provider, MD  Multiple Vitamins-Minerals  (MULTIVITAMIN WITH MINERALS) tablet Take 1 tablet by mouth every morning.    Yes Historical Provider, MD  omeprazole (PRILOSEC) 40 MG capsule Take 40 mg by mouth daily. 11/04/14  Yes Historical Provider, MD  ondansetron (ZOFRAN) 4 MG tablet Take 4 mg by mouth every 8 (eight) hours as needed for nausea or vomiting.  09/05/14  Yes Historical Provider, MD  polyethylene glycol (MIRALAX / GLYCOLAX) packet Take 17 g by mouth daily. Patient taking differently: Take 17 g by mouth daily as needed for mild constipation or moderate constipation.  01/16/14  Yes Simonne Martinet, NP  VENTOLIN HFA 108 (90 Base) MCG/ACT inhaler INHALE TWO PUFFS BY MOUTH EVERY 6 HOURS AS NEEDED FOR WHEEZING OR SHORTNESS OF BREATH 06/30/15  Yes Leslye Peer, MD  doxycycline (VIBRAMYCIN) 100 MG capsule Take 1 capsule (100 mg total) by mouth 2 (two) times daily. One po bid x 7 days Patient not taking: Reported on 07/21/2015 04/25/15   Dione Booze, MD  predniSONE (DELTASONE) 50 MG tablet Take 1 tablet (50 mg total) by mouth daily. Patient not taking: Reported on 07/21/2015 04/25/15   Dione Booze, MD    Physical Exam: Filed Vitals:   07/21/15 1315 07/21/15 1400 07/21/15 1407 07/21/15 1434  BP: 130/96 142/98  148/93  Pulse: 126 114  111  Temp: 97.9 F (36.6 C)  98.1 F (36.7 C) 97.3 F (36.3 C)  TempSrc: Oral  Oral Oral  Resp: Height:      Weight:      SpO2: 88% 97%  100%      Constitutional: NAD, calm, comfortable Filed Vitals:   07/21/15 1315 07/21/15 1400 07/21/15 1407 07/21/15 1434  BP: 130/96 142/98  148/93  Pulse: 126 114  111  Temp: 97.9 F (36.6 C)  98.1 F (36.7 C) 97.3 F (36.3 C)  TempSrc: Oral  Oral Oral  Resp: Height:      Weight:      SpO2: 88% 97%  100%   Eyes: PERRL, lids and conjunctivae normal ENMT: Mucous membranes are moist. Posterior pharynx clear of any exudate or lesions.Normal dentition.  Neck: normal, supple, no masses, no thyromegaly Respiratory: Patient has subtle  rales, expiratory wheezes on expiration. Equal chest rise. Good expiratory effort Cardiovascular: Regular rate and rhythm, no murmurs / rubs / gallops. No extremity edema. 2+ pedal pulses. No carotid bruits.  Abdomen: no tenderness, no masses palpated. No hepatosplenomegaly. Bowel sounds positive.  Musculoskeletal: no clubbing / cyanosis. No joint deformity upper and lower extremities. Good ROM, no contractures. Normal muscle tone.  Skin: no rashes, lesions, ulcers. No induration Neurologic: CN 3-12 grossly intact. Sensation intact, DTR normal. Strength 5/5 in all 4.  Psychiatric: Normal judgment and insight. Alert and oriented x 3. Normal mood.     Labs on Admission: I have personally reviewed following labs and imaging studies  CBC:  Recent  Labs Lab 07/21/15 1121  WBC 5.2  HGB 12.8*  HCT 39.1  MCV 82.7  PLT 269   Basic Metabolic Panel:  Recent Labs Lab 07/21/15 1121  NA 139  K 3.8  CL 99*  CO2 31  GLUCOSE 86  BUN 10  CREATININE 1.00  CALCIUM 9.3   GFR: Estimated Creatinine Clearance: 82.6 mL/min (by C-G formula based on Cr of 1). Liver Function Tests: No results for input(s): AST, ALT, ALKPHOS, BILITOT, PROT, ALBUMIN in the last 168 hours. No results for input(s): LIPASE, AMYLASE in the last 168 hours. No results for input(s): AMMONIA in the last 168 hours. Coagulation Profile: No results for input(s): INR, PROTIME in the last 168 hours. Cardiac Enzymes: No results for input(s): CKTOTAL, CKMB, CKMBINDEX, TROPONINI in the last 168 hours. BNP (last 3 results) No results for input(s): PROBNP in the last 8760 hours. HbA1C: No results for input(s): HGBA1C in the last 72 hours. CBG: No results for input(s): GLUCAP in the last 168 hours. Lipid Profile: No results for input(s): CHOL, HDL, LDLCALC, TRIG, CHOLHDL, LDLDIRECT in the last 72 hours. Thyroid Function Tests: No results for input(s): TSH, T4TOTAL, FREET4, T3FREE, THYROIDAB in the last 72 hours. Anemia  Panel: No results for input(s): VITAMINB12, FOLATE, FERRITIN, TIBC, IRON, RETICCTPCT in the last 72 hours. Urine analysis:    Component Value Date/Time   COLORURINE YELLOW 01/09/2014 1843   APPEARANCEUR CLEAR 01/09/2014 1843   LABSPEC 1.023 01/09/2014 1843   PHURINE 5.5 01/09/2014 1843   GLUCOSEU 250* 01/09/2014 1843   HGBUR NEGATIVE 01/09/2014 1843   BILIRUBINUR NEGATIVE 01/09/2014 1843   KETONESUR NEGATIVE 01/09/2014 1843   PROTEINUR NEGATIVE 01/09/2014 1843   UROBILINOGEN 0.2 01/09/2014 1843   NITRITE NEGATIVE 01/09/2014 1843   LEUKOCYTESUR NEGATIVE 01/09/2014 1843   Sepsis Labs: !!!!!!!!!!!!!!!!!!!!!!!!!!!!!!!!!!!!!!!!!!!! @LABRCNTIP (procalcitonin:4,lacticidven:4) )No results found for this or any previous visit (from the past 240 hour(s)).   Radiological Exams on Admission: Dg Chest 2 View  07/21/2015  CLINICAL DATA:  History of sarcoidosis.  Difficulty breathing EXAM: CHEST  2 VIEW COMPARISON:  April 24, 2015 FINDINGS: Widespread cicatrization is noted, stable. There are areas of probable chronic mucous plugging with patchy opacity throughout the lungs, most severe in the mid lower lung zones, stable. There is no new opacity appreciable. The heart size and pulmonary vascularity are normal. No adenopathy is demonstrable. No bone lesions. IMPRESSION: No appreciable change from study performed 3 months prior. Extensive scarring and areas of probable chronic mucous plugging. No new opacity. No change in cardiac silhouette. These changes are consistent with known history of sarcoidosis. It should be noted that subtle pneumonia could easily be obscured given the degree of scarring and opacity present currently. Electronically Signed   By: Bretta Bang III M.D.   On: 07/21/2015 10:25    EKG: Independently reviewed. Sinus tachycardia with no ST elevation or depressions  Assessment/Plan   COPD exacerbation (HCC) - Place on Levaquin - supplemental oxygen as needed - Solumedrol -  spiriva - albuterol scheduled and prn  Active Problems:   GERD (gastroesophageal reflux disease) - stable currently    Essential hypertension - Continue home medication regimen    DVT prophylaxis: lovenox Code Status: full Family Communication: d/c pt directly Disposition Plan: pending improvement in respiratory condition. Med surg Consults called: None Admission status: obs   Penny Pia MD [facility] Pager [phone]  If 7PM-7AM, please contact night-coverage [email or web address] Password [email or web address]  07/21/2015, 3:01 PM

## 2015-07-21 NOTE — ED Notes (Signed)
Pt has hx of sarcoidosis, c/o sudden onset L sided "lung"/chest pain with SOB starting last night. Pt has hx of L lung collapse in 2014. Lung sounds diminished at this time. Pt on 3-4 L O2 baseline. Pt's O2 75% on arrival on 3L. Pt A&Ox4 and ambulatory. Pt having difficulty speaking in complete sentences. Pt's chest pain worse with coughing or twisting. Pt also c/o productive cough with yellow sputum x 1 week.

## 2015-07-22 LAB — CBC
HEMATOCRIT: 35.6 % — AB (ref 39.0–52.0)
HEMOGLOBIN: 11.4 g/dL — AB (ref 13.0–17.0)
MCH: 26.6 pg (ref 26.0–34.0)
MCHC: 32 g/dL (ref 30.0–36.0)
MCV: 83 fL (ref 78.0–100.0)
Platelets: 223 10*3/uL (ref 150–400)
RBC: 4.29 MIL/uL (ref 4.22–5.81)
RDW: 12.5 % (ref 11.5–15.5)
WBC: 6.3 10*3/uL (ref 4.0–10.5)

## 2015-07-22 LAB — BASIC METABOLIC PANEL
ANION GAP: 9 (ref 5–15)
BUN: 12 mg/dL (ref 6–20)
CHLORIDE: 102 mmol/L (ref 101–111)
CO2: 25 mmol/L (ref 22–32)
Calcium: 9.2 mg/dL (ref 8.9–10.3)
Creatinine, Ser: 0.96 mg/dL (ref 0.61–1.24)
GFR calc Af Amer: >60 mL/min (ref >60)
GFR calc non Af Amer: >60 mL/min (ref >60)
Glucose, Bld: 164 mg/dL — ABNORMAL HIGH (ref 65–99)
POTASSIUM: 4.4 mmol/L (ref 3.5–5.1)
SODIUM: 136 mmol/L (ref 135–145)

## 2015-07-22 MED ORDER — SALINE SPRAY 0.65 % NA SOLN
1.0000 | NASAL | Status: DC | PRN
  Administered 2015-07-22: 1 via NASAL
  Filled 2015-07-22: qty 44

## 2015-07-22 MED ORDER — OXYCODONE HCL 5 MG PO TABS
5.0000 mg | ORAL_TABLET | ORAL | Status: DC | PRN
  Administered 2015-07-22 – 2015-07-25 (×6): 5 mg via ORAL
  Filled 2015-07-22 (×6): qty 1

## 2015-07-22 MED ORDER — TRAMADOL HCL 50 MG PO TABS
50.0000 mg | ORAL_TABLET | Freq: Four times a day (QID) | ORAL | Status: DC | PRN
  Administered 2015-07-22: 50 mg via ORAL
  Filled 2015-07-22: qty 1

## 2015-07-22 MED ORDER — TRAMADOL HCL 50 MG PO TABS: 50.0000 mg | ORAL_TABLET | Freq: Four times a day (QID) | ORAL | Status: DC

## 2015-07-22 MED ORDER — METHYLPREDNISOLONE SODIUM SUCC 125 MG IJ SOLR
60.0000 mg | Freq: Two times a day (BID) | INTRAMUSCULAR | Status: DC
  Administered 2015-07-22 – 2015-07-25 (×7): 60 mg via INTRAVENOUS
  Filled 2015-07-22 (×7): qty 2

## 2015-07-22 NOTE — Progress Notes (Signed)
Spoke with patient at bedside, states he uses O2 at home, CPAP, chest vest daily. Independent of ADL's, drives self to appts, no issues with medications, has supportive girlfriend. Will continue to follow for d/c needs.

## 2015-07-22 NOTE — Progress Notes (Signed)
PROGRESS NOTE    [name]  ZOX:[id number] DOB: 1965-01-24 DOA: 07/21/2015 PCP: Clydie Braun, MD    Brief Narrative:  50 y.o. male with medical history significant of copd on home oxygen, gerd, sarcoidosis, HTN. Presenting with SOB, doe, and increase complaints of cough and sputum production  Assessment & Plan:    COPD exacerbation (HCC) -  We'll continue current medical regimen but increase Solu-Medrol to 60 mg IV every 12 hours. As patient is reporting only minimal improvement in condition  Active Problems:   GERD (gastroesophageal reflux disease) - Protonix    Essential hypertension - Patient on bisoprolol, diltiazem   DVT prophylaxis: Lovenox Code Status: full Family Communication: None at bedside Disposition Plan: Pending improvement in condition   Consultants:   None   Procedures: None   Antimicrobials: doxycycline  Subjective: Patient has no new complaints. No acute issues overnight. States he has minimal improvement in respiratory condition today  Objective: Filed Vitals:   07/22/15 0905 07/22/15 1155 07/22/15 1535 07/22/15 1622  BP:    112/84  Pulse:    97  Temp:    97.9 F (36.6 C)  TempSrc:    Oral  Resp:    18  Height:      Weight:      SpO2: 99% 98% 98% 98%    Intake/Output Summary (Last 24 hours) at 07/22/15 1751 Last data filed at 07/22/15 1457  Gross per 24 hour  Intake    500 ml  Output    125 ml  Net    375 ml   Filed Weights   07/21/15 0958  Weight: 75.751 kg (167 lb)    Examination: Eyes: PERRL, lids and conjunctivae normal ENMT: Mucous membranes are moist. Posterior pharynx clear of any exudate or lesions. Normal dentition.  Neck: normal, supple, no masses, no thyromegaly Respiratory: Patient has subtle rales, expiratory wheezes on expiration. Equal chest rise. Good expiratory effort Cardiovascular: Regular rate and rhythm, no murmurs / rubs / gallops. No extremity edema. 2+ pedal pulses. No carotid bruits.   Abdomen: no tenderness, no masses palpated. No hepatosplenomegaly. Bowel sounds positive.  Musculoskeletal: no clubbing / cyanosis. No joint deformity upper and lower extremities. Good ROM, no contractures. Normal muscle tone.  Skin: no rashes, lesions, ulcers. No induration Neurologic: CN 3-12 grossly intact. Sensation intact, DTR normal. Strength 5/5 in all 4.  Psychiatric: Normal judgment and insight. Alert and oriented x 3. Normal mood.   Data Reviewed: I have personally reviewed following labs and imaging studies  CBC:  Recent Labs Lab 07/21/15 1121 07/22/15 0532  WBC 5.2 6.3  HGB 12.8* 11.4*  HCT 39.1 35.6*  MCV 82.7 83.0  PLT 269 223   Basic Metabolic Panel:  Recent Labs Lab 07/21/15 1121 07/22/15 0532  NA 139 136  K 3.8 4.4  CL 99* 102  CO2 31 25  GLUCOSE 86 164*  BUN 10 12  CREATININE 1.00 0.96  CALCIUM 9.3 9.2   GFR: Estimated Creatinine Clearance: 86.1 mL/min (by C-G formula based on Cr of 0.96). Liver Function Tests: No results for input(s): AST, ALT, ALKPHOS, BILITOT, PROT, ALBUMIN in the last 168 hours. No results for input(s): LIPASE, AMYLASE in the last 168 hours. No results for input(s): AMMONIA in the last 168 hours. Coagulation Profile: No results for input(s): INR, PROTIME in the last 168 hours. Cardiac Enzymes: No results for input(s): CKTOTAL, CKMB, CKMBINDEX, TROPONINI in the last 168 hours. BNP (last 3 results) No results for input(s): PROBNP in the  last 8760 hours. HbA1C: No results for input(s): HGBA1C in the last 72 hours. CBG: No results for input(s): GLUCAP in the last 168 hours. Lipid Profile: No results for input(s): CHOL, HDL, LDLCALC, TRIG, CHOLHDL, LDLDIRECT in the last 72 hours. Thyroid Function Tests: No results for input(s): TSH, T4TOTAL, FREET4, T3FREE, THYROIDAB in the last 72 hours. Anemia Panel: No results for input(s): VITAMINB12, FOLATE, FERRITIN, TIBC, IRON, RETICCTPCT in the last 72 hours. Sepsis Labs: No  results for input(s): PROCALCITON, LATICACIDVEN in the last 168 hours.  No results found for this or any previous visit (from the past 240 hour(s)).       Radiology Studies: Dg Chest 2 View  07/21/2015  CLINICAL DATA:  History of sarcoidosis.  Difficulty breathing EXAM: CHEST  2 VIEW COMPARISON:  April 24, 2015 FINDINGS: Widespread cicatrization is noted, stable. There are areas of probable chronic mucous plugging with patchy opacity throughout the lungs, most severe in the mid lower lung zones, stable. There is no new opacity appreciable. The heart size and pulmonary vascularity are normal. No adenopathy is demonstrable. No bone lesions. IMPRESSION: No appreciable change from study performed 3 months prior. Extensive scarring and areas of probable chronic mucous plugging. No new opacity. No change in cardiac silhouette. These changes are consistent with known history of sarcoidosis. It should be noted that subtle pneumonia could easily be obscured given the degree of scarring and opacity present currently. Electronically Signed   By: Bretta BangWilliam  Woodruff III M.D.   On: 07/21/2015 10:25        Scheduled Meds: . bisoprolol  10 mg Oral Daily  . calcium-vitamin D  1 tablet Oral q morning - 10a  . cholecalciferol  400 Units Oral Daily  . diltiazem  120 mg Oral Daily  . doxycycline (VIBRAMYCIN) IV  100 mg Intravenous Q12H  . enoxaparin (LOVENOX) injection  40 mg Subcutaneous Q24H  . ferrous sulfate  325 mg Oral BID WC  . guaiFENesin  600 mg Oral BID  . ipratropium-albuterol  3 mL Nebulization QID  . methylPREDNISolone (SOLU-MEDROL) injection  40 mg Intravenous Q12H  . multivitamin with minerals  1 tablet Oral q morning - 10a  . pantoprazole  40 mg Oral Daily  . sodium chloride flush  3 mL Intracatheter Q12H   Continuous Infusions:    LOS: 1 day   Time spent: > 35 minutes  Penny PiaVEGA, Dola Lunsford, MD [facility] Pager [phone]  If 7PM-7AM, please contact  night-coverage [email or web address] Password [email or web address] 07/22/2015, 5:51 PM

## 2015-07-23 DIAGNOSIS — K219 Gastro-esophageal reflux disease without esophagitis: Secondary | ICD-10-CM

## 2015-07-23 DIAGNOSIS — D869 Sarcoidosis, unspecified: Secondary | ICD-10-CM

## 2015-07-23 MED ORDER — BUDESONIDE 0.25 MG/2ML IN SUSP
0.2500 mg | Freq: Two times a day (BID) | RESPIRATORY_TRACT | Status: DC
  Administered 2015-07-23 – 2015-07-24 (×3): 0.25 mg via RESPIRATORY_TRACT
  Filled 2015-07-23 (×2): qty 2

## 2015-07-23 NOTE — Progress Notes (Signed)
PROGRESS NOTE  [name] AVW:[id number] DOB: [birth year] DOA: 07/21/2015 PCP: Clydie Braun, MD   LOS: 2 days   Brief Narrative: 50 y.o. male with medical history significant of copd on home oxygen, gerd, sarcoidosis, HTN. Presenting with SOB, doe, and increase complaints of cough and sputum production  Assessment & Plan: Active Problems:   GERD (gastroesophageal reflux disease)   Essential hypertension   COPD exacerbation (HCC)   COPD exacerbation (HCC) / Sarcoidosis -continue current medical regimen with IV steroids, antibiotics - add Pulmicort - no significant wheezing but lung sounds quite distant  Chronic hypoxic respiratory failure - on chronic home O2, continue - dyspneic still today with minimal activity   GERD (gastroesophageal reflux disease) - Protonix  Essential hypertension - Patient on bisoprolol, diltiazem   DVT prophylaxis: Lovenox Code Status: Full Family Communication: no family bedside Disposition Plan: home 1-2 days pending improvement   Consultants:   None  Procedures:   None   Antimicrobials:  Doxycycline 7/10 >>  Subjective: - no chest pain, no abdominal pain, nausea or vomiting.  - ongoing dyspnea  Objective: Filed Vitals:   07/22/15 2104 07/23/15 0001 07/23/15 0401 07/23/15 0735  BP: 127/89  123/97   Pulse: 101  87   Temp: 99.1 F (37.3 C)  97.8 F (36.6 C)   TempSrc: Oral  Oral   Resp: Height:      Weight:      SpO2: 100%  98% 99%    Intake/Output Summary (Last 24 hours) at 07/23/15 1056 Last data filed at 07/23/15 1006  Gross per 24 hour  Intake    483 ml  Output    375 ml  Net    108 ml   Filed Weights   07/21/15 0958  Weight: 75.751 kg (167 lb)    Examination: Constitutional: NAD Filed Vitals:   07/22/15 2104 07/23/15 0001 07/23/15 0401 07/23/15 0735  BP: 127/89  123/97   Pulse: 101  87   Temp: 99.1 F (37.3 C)  97.8 F (36.6 C)   TempSrc: Oral  Oral   Resp: Height:      Weight:      SpO2: 100%  98% 99%   Eyes: PERRL, lids and conjunctivae normal Respiratory: scant wheezing cleared with coughing, distant breath sounds. No crackles Cardiovascular: Regular rate and rhythm, no murmurs / rubs / gallops. No LE edema. 2+ pedal pulses.  Abdomen: no tenderness. Bowel sounds positive.  Musculoskeletal: no clubbing / cyanosis.  Skin: no rashes, lesions, ulcers. No induration Neurologic: non focal    Data Reviewed: I have personally reviewed following labs and imaging studies  CBC:  Recent Labs Lab 07/21/15 1121 07/22/15 0532  WBC 5.2 6.3  HGB 12.8* 11.4*  HCT 39.1 35.6*  MCV 82.7 83.0  PLT 269 223   Basic Metabolic Panel:  Recent Labs Lab 07/21/15 1121 07/22/15 0532  NA 139 136  K 3.8 4.4  CL 99* 102  CO2 31 25  GLUCOSE 86 164*  BUN 10 12  CREATININE 1.00 0.96  CALCIUM 9.3 9.2   GFR: Estimated Creatinine Clearance: 86.1 mL/min (by C-G formula based on Cr of 0.96). Liver Function Tests: No results for input(s): AST, ALT, ALKPHOS, BILITOT, PROT, ALBUMIN in the last 168 hours. No results for input(s): LIPASE, AMYLASE in the last 168 hours. No results for input(s): AMMONIA in the last 168 hours. Coagulation Profile: No results for input(s): INR, PROTIME in the last  168 hours. Cardiac Enzymes: No results for input(s): CKTOTAL, CKMB, CKMBINDEX, TROPONINI in the last 168 hours. BNP (last 3 results) No results for input(s): PROBNP in the last 8760 hours. HbA1C: No results for input(s): HGBA1C in the last 72 hours. CBG: No results for input(s): GLUCAP in the last 168 hours. Lipid Profile: No results for input(s): CHOL, HDL, LDLCALC, TRIG, CHOLHDL, LDLDIRECT in the last 72 hours. Thyroid Function Tests: No results for input(s): TSH, T4TOTAL, FREET4, T3FREE, THYROIDAB in the last 72 hours. Anemia Panel: No results for input(s): VITAMINB12, FOLATE, FERRITIN, TIBC, IRON, RETICCTPCT in the last 72 hours. Urine analysis:     Component Value Date/Time   COLORURINE YELLOW 01/09/2014 1843   APPEARANCEUR CLEAR 01/09/2014 1843   LABSPEC 1.023 01/09/2014 1843   PHURINE 5.5 01/09/2014 1843   GLUCOSEU 250* 01/09/2014 1843   HGBUR NEGATIVE 01/09/2014 1843   BILIRUBINUR NEGATIVE 01/09/2014 1843   KETONESUR NEGATIVE 01/09/2014 1843   PROTEINUR NEGATIVE 01/09/2014 1843   UROBILINOGEN 0.2 01/09/2014 1843   NITRITE NEGATIVE 01/09/2014 1843   LEUKOCYTESUR NEGATIVE 01/09/2014 1843   Sepsis Labs: Invalid input(s): PROCALCITONIN, LACTICIDVEN  No results found for this or any previous visit (from the past 240 hour(s)).    Radiology Studies: No results found.   Scheduled Meds: . bisoprolol  10 mg Oral Daily  . budesonide (PULMICORT) nebulizer solution  0.25 mg Nebulization BID  . calcium-vitamin D  1 tablet Oral q morning - 10a  . cholecalciferol  400 Units Oral Daily  . diltiazem  120 mg Oral Daily  . doxycycline (VIBRAMYCIN) IV  100 mg Intravenous Q12H  . enoxaparin (LOVENOX) injection  40 mg Subcutaneous Q24H  . ferrous sulfate  325 mg Oral BID WC  . guaiFENesin  600 mg Oral BID  . ipratropium-albuterol  3 mL Nebulization QID  . methylPREDNISolone (SOLU-MEDROL) injection  60 mg Intravenous Q12H  . multivitamin with minerals  1 tablet Oral q morning - 10a  . pantoprazole  40 mg Oral Daily  . sodium chloride flush  3 mL Intracatheter Q12H   Continuous Infusions:   Pamella Pertostin Gherghe, MD, PhD [facility] Pager [phone]  If 7PM-7AM, please contact night-coverage [email or web address] Password [email or web address] 07/23/2015, 10:56 AM

## 2015-07-24 DIAGNOSIS — J441 Chronic obstructive pulmonary disease with (acute) exacerbation: Secondary | ICD-10-CM

## 2015-07-24 LAB — EXPECTORATED SPUTUM ASSESSMENT W GRAM STAIN, RFLX TO RESP C

## 2015-07-24 LAB — RAPID URINE DRUG SCREEN, HOSP PERFORMED
Amphetamines: NOT DETECTED
BARBITURATES: NOT DETECTED
BENZODIAZEPINES: NOT DETECTED
COCAINE: NOT DETECTED
Opiates: NOT DETECTED
TETRAHYDROCANNABINOL: NOT DETECTED

## 2015-07-24 LAB — EXPECTORATED SPUTUM ASSESSMENT W REFEX TO RESP CULTURE

## 2015-07-24 MED ORDER — BUDESONIDE 0.5 MG/2ML IN SUSP
0.5000 mg | Freq: Two times a day (BID) | RESPIRATORY_TRACT | Status: DC
  Administered 2015-07-24 – 2015-07-26 (×4): 0.5 mg via RESPIRATORY_TRACT
  Filled 2015-07-24 (×4): qty 2

## 2015-07-24 NOTE — Consult Note (Signed)
Name: [name] MRN: [id number] DOB: 05/21/1965    ADMISSION DATE:  07/21/2015 CONSULTATION DATE:  07/24/15  REFERRING MD :  Dr. Elvera Lennox  CHIEF COMPLAINT:  Shortness of Breath    HISTORY OF PRESENT ILLNESS:  50 y/o M, well known to PCCM service (followed by Dr. Delton Coombes) with a PMH of HTN, substance abuse (prior cocaine), intermittent non-compliance, OSA on CPAP, sarcoidosis, chronic hypoxic respiratory failure on 4L and bronchiectasis who presented to [facility]  On 7/10 with complaints of increased left sided pleuritic pain, cough and dyspnea.  The patient reports that he had several days of progressive worsening of shortness of breath, increased cough with yellow sputum production, pleuritic chest pain with coughing/bending over and fatigue.  He denies fevers, chills, n/v/d, headache.  On presentation, he was found to be hypoxic on baseline 4L to 75-80%.  This resolved with rest and coaching of deep breathing.  Initial CXR showed known scarring, PNA not ruled out with scarring.  The patient was admitted for further evaluation of dyspnea.  He was treated with IV steroids, doxycycline and nebulized steroids.    He was last seen in April 2017 in the pulmonary office and at that time was noted to have difficulty obtaining medications.  He was instructed to discuss with insurance regarding cost / coverage options.  Unfortunately, he has been off his medications since that time. He has been using PRN albuterol only and reports "using it way too much".    PCCM consulted for evaluation of dyspnea.    PAST MEDICAL HISTORY :   has a past medical history of Hypertension; Sarcoidosis (HCC); OSA on CPAP; Chronic respiratory failure (HCC); Bronchitis; Pneumonia; COPD (chronic obstructive pulmonary disease) (HCC); and Neuromuscular disorder (HCC).  has past surgical history that includes Rotator cuff repair; arm surgery ; and Video assisted thoracoscopy (vats)/thorocotomy (Left, 07/17/2012).   Prior to Admission  medications   Medication Sig Start Date End Date Taking? Authorizing Provider  albuterol (PROVENTIL) (2.5 MG/3ML) 0.083% nebulizer solution Take 2.5 mg by nebulization every 4 (four) hours as needed for wheezing or shortness of breath (wheezing and shortness of breath).  06/08/12  Yes Historical Provider, MD  bisacodyl (BISACODYL) 5 MG EC tablet Take 1 tablet (5 mg total) by mouth daily as needed for moderate constipation. 01/16/14  Yes Simonne Martinet, NP  bisoprolol (ZEBETA) 10 MG tablet Take 1 tablet (10 mg total) by mouth daily. 03/26/14  Yes Maryann Mikhail, DO  calcium-vitamin D (OSCAL WITH D) 500-200 MG-UNIT per tablet Take 1 tablet by mouth every morning.   Yes Historical Provider, MD  cyclobenzaprine (FLEXERIL) 10 MG tablet Take 10 mg by mouth 3 (three) times daily as needed for muscle spasms (muscle spasms).    Yes Historical Provider, MD  diltiazem (CARDIZEM CD) 120 MG 24 hr capsule Take 1 capsule (120 mg total) by mouth daily. 01/28/14  Yes Jeanella Craze, NP  diphenhydrAMINE (BENADRYL) 25 MG tablet Take 25 mg by mouth every 6 (six) hours as needed for allergies (allergies).    Yes Historical Provider, MD  Ergocalciferol (VITAMIN D2) 400 units TABS Take 400 Units by mouth daily. 03/18/08  Yes Historical Provider, MD  ferrous sulfate 325 (65 FE) MG tablet Take 1 tablet (325 mg total) by mouth 2 (two) times daily with a meal. 06/18/12  Yes Leroy Sea, MD  fluticasone (FLONASE) 50 MCG/ACT nasal spray Place 2 sprays into both nostrils 2 (two) times daily. Patient taking differently: Place 2 sprays into both nostrils  2 (two) times daily as needed for allergies.  01/28/14  Yes Jeanella Craze, NP  guaiFENesin (MUCINEX) 600 MG 12 hr tablet Take 1 tablet (600 mg total) by mouth 2 (two) times daily. 01/16/15  Yes Nishant Dhungel, MD  magnesium citrate SOLN Take 1 Bottle by mouth daily as needed for mild constipation or severe constipation.   Yes Historical Provider, MD  Multiple Vitamins-Minerals  (MULTIVITAMIN WITH MINERALS) tablet Take 1 tablet by mouth every morning.    Yes Historical Provider, MD  omeprazole (PRILOSEC) 40 MG capsule Take 40 mg by mouth daily. 11/04/14  Yes Historical Provider, MD  ondansetron (ZOFRAN) 4 MG tablet Take 4 mg by mouth every 8 (eight) hours as needed for nausea or vomiting.  09/05/14  Yes Historical Provider, MD  polyethylene glycol (MIRALAX / GLYCOLAX) packet Take 17 g by mouth daily. Patient taking differently: Take 17 g by mouth daily as needed for mild constipation or moderate constipation.  01/16/14  Yes Simonne Martinet, NP  VENTOLIN HFA 108 (90 Base) MCG/ACT inhaler INHALE TWO PUFFS BY MOUTH EVERY 6 HOURS AS NEEDED FOR WHEEZING OR SHORTNESS OF BREATH 06/30/15  Yes Leslye Peer, MD  doxycycline (VIBRAMYCIN) 100 MG capsule Take 1 capsule (100 mg total) by mouth 2 (two) times daily. One po bid x 7 days Patient not taking: Reported on 07/21/2015 04/25/15   Dione Booze, MD  predniSONE (DELTASONE) 50 MG tablet Take 1 tablet (50 mg total) by mouth daily. Patient not taking: Reported on 07/21/2015 04/25/15   Dione Booze, MD   Allergies  Allergen Reactions  . Clindamycin/Lincomycin Anaphylaxis and Swelling    Made face and tongue swell    FAMILY HISTORY:  family history includes Diabetes in his father.   SOCIAL HISTORY:  reports that he has never smoked. He has never used smokeless tobacco. He reports that he drinks alcohol. He reports that he does not use illicit drugs.  REVIEW OF SYSTEMS:  POSITIVES IN BOLD Constitutional: Negative for fever, chills, weight loss, malaise/fatigue and diaphoresis.  HENT: Negative for hearing loss, ear pain, nosebleeds, congestion, sore throat, neck pain, tinnitus and ear discharge.   Eyes: Negative for blurred vision, double vision, photophobia, pain, discharge and redness.  Respiratory: Negative for cough, hemoptysis, yellow sputum production, shortness of breath, wheezing, pleuritic chest pain and stridor.     Cardiovascular: Negative for chest pain, palpitations, orthopnea, claudication, leg swelling and PND.  Gastrointestinal: Negative for heartburn, nausea, vomiting, abdominal pain, diarrhea, constipation, blood in stool and melena.  Genitourinary: Negative for dysuria, urgency, frequency, hematuria and flank pain.  Musculoskeletal: Negative for myalgias, back pain, joint pain and falls.  Skin: Negative for itching and rash.  Neurological: Negative for dizziness, tingling, tremors, sensory change, speech change, focal weakness, seizures, loss of consciousness, weakness and headaches.  Endo/Heme/Allergies: Negative for environmental allergies and polydipsia. Does not bruise/bleed easily.  SUBJECTIVE: as above  VITAL SIGNS: Temp:  [97.6 F (36.4 C)-98 F (36.7 C)] 97.9 F (36.6 C) (07/13 1610) Pulse Rate:  [78-84] 84 (07/13 0638) Resp:  [17-20] 17 (07/13 0638) BP: (137-151)/(98-103) 145/103 mmHg (07/13 0638) SpO2:  [96 %-100 %] 100 % (07/13 0746)  PHYSICAL EXAMINATION: General:  Well developed adult male in NAD Neuro:  AAOx4, speech clear, MAE  HEENT:  MM pink/moist, no JVD Cardiovascular:  s1s2 rrr, no m/r/g Lungs: mild tachypnea, lungs bilaterally coarse with wheezing Abdomen:  Obese/soft, BSx4 active  Musculoskeletal:  No acute deformities  Skin:  Warm/dry, no edema  Recent Labs Lab 07/21/15 1121 07/22/15 0532  NA 139 136  K 3.8 4.4  CL 99* 102  CO2 31 25  BUN 10 12  CREATININE 1.00 0.96  GLUCOSE 86 164*    Recent Labs Lab 07/21/15 1121 07/22/15 0532  HGB 12.8* 11.4*  HCT 39.1 35.6*  WBC 5.2 6.3  PLT 269 223   No results found.   SIGNIFICANT EVENTS  7/10  Admit with dyspnea   STUDIES:  CXR & CT images personally reviewed back to 09/2014.   ASSESSMENT / PLAN:  1.  Acute Bronchiectasis Exacerbation   2.  Sarcoidosis - significant scarring / bullous disease  3.  Acute on Chronic Hypoxic Respiratory Failure   4.  Dyspnea - secondary to above   5.   OSA on CPAP    Plan: Assess UDS Budesonide 0.5 BID Continue Q4 Duoneb Add vibra vest, flutter valve for secretion clearance Assess sputum for culture, AFB Continue empiric doxycycline  Continue solumedrol 60mg  IV Q12 PPI Will ask case manager to review insurance and help determine most affordable option of medication (Symbicort, Advair etc) for maintenance therapy.  Continue CPAP QHS & PRN sleep  Tessalon for cough     Canary BrimBrandi Amarien Carne, NP-C [facility] Pgr: [phone] or if no answer [phone] 07/24/2015, 11:40 AM

## 2015-07-24 NOTE — Progress Notes (Signed)
PROGRESS NOTE  [name] ZOX:[id number] DOB: 05/15/1965 DOA: 07/21/2015 PCP: Clydie BraunENIZARD-THOMPSON, NANCY, MD   LOS: 3 days   Brief Narrative: 50 y.o. male with medical history significant of copd on home oxygen, gerd, sarcoidosis, HTN. Presenting with SOB, doe, and increase complaints of cough and sputum production  Assessment & Plan: Active Problems:   GERD (gastroesophageal reflux disease)   Essential hypertension   COPD exacerbation (HCC)   COPD exacerbation (HCC) / Sarcoidosis -continue current medical regimen with IV steroids, antibiotics - no significant wheezing but lung sounds quite distant breath sounds - since no improvement, consulted Pulmonary today, appreciate input  Chronic hypoxic respiratory failure - on chronic home O2, continue - dyspneic still today with minimal activity   GERD (gastroesophageal reflux disease) - Protonix  Essential hypertension - Patient on bisoprolol, diltiazem   DVT prophylaxis: Lovenox Code Status: Full Family Communication: no family bedside Disposition Plan: home pending improvement   Consultants:   Pulmonary  Procedures:   None   Antimicrobials:  Doxycycline 7/10 >>  Subjective: - no chest pain, no abdominal pain, nausea or vomiting.  - ongoing dyspnea, no improvement   Objective: Filed Vitals:   07/24/15 0638 07/24/15 0740 07/24/15 0746 07/24/15 1220  BP: 145/103     Pulse: 84     Temp: 97.9 F (36.6 C)     TempSrc: Oral     Resp: 17     Height:      Weight:      SpO2: 100% 100% 100% 100%    Intake/Output Summary (Last 24 hours) at 07/24/15 1246 Last data filed at 07/24/15 0600  Gross per 24 hour  Intake    250 ml  Output      0 ml  Net    250 ml   Filed Weights   07/21/15 0958  Weight: 75.751 kg (167 lb)    Examination: Constitutional: NAD Filed Vitals:   07/24/15 0638 07/24/15 0740 07/24/15 0746 07/24/15 1220  BP: 145/103     Pulse: 84     Temp: 97.9 F (36.6 C)     TempSrc: Oral       Resp: 17     Height:      Weight:      SpO2: 100% 100% 100% 100%   Eyes: PERRL, lids and conjunctivae normal Respiratory: scant wheezing cleared with coughing, distant breath sounds. No crackles Cardiovascular: Regular rate and rhythm, no murmurs / rubs / gallops. No LE edema. 2+ pedal pulses.  Abdomen: no tenderness. Bowel sounds positive.  Musculoskeletal: no clubbing / cyanosis.  Skin: no rashes, lesions, ulcers. No induration Neurologic: non focal    Data Reviewed: I have personally reviewed following labs and imaging studies  CBC:  Recent Labs Lab 07/21/15 1121 07/22/15 0532  WBC 5.2 6.3  HGB 12.8* 11.4*  HCT 39.1 35.6*  MCV 82.7 83.0  PLT 269 223   Basic Metabolic Panel:  Recent Labs Lab 07/21/15 1121 07/22/15 0532  NA 139 136  K 3.8 4.4  CL 99* 102  CO2 31 25  GLUCOSE 86 164*  BUN 10 12  CREATININE 1.00 0.96  CALCIUM 9.3 9.2   GFR: Estimated Creatinine Clearance: 86.1 mL/min (by C-G formula based on Cr of 0.96). Liver Function Tests: No results for input(s): AST, ALT, ALKPHOS, BILITOT, PROT, ALBUMIN in the last 168 hours. No results for input(s): LIPASE, AMYLASE in the last 168 hours. No results for input(s): AMMONIA in the last 168 hours. Coagulation Profile: No results for input(s):  INR, PROTIME in the last 168 hours. Cardiac Enzymes: No results for input(s): CKTOTAL, CKMB, CKMBINDEX, TROPONINI in the last 168 hours. BNP (last 3 results) No results for input(s): PROBNP in the last 8760 hours. HbA1C: No results for input(s): HGBA1C in the last 72 hours. CBG: No results for input(s): GLUCAP in the last 168 hours. Lipid Profile: No results for input(s): CHOL, HDL, LDLCALC, TRIG, CHOLHDL, LDLDIRECT in the last 72 hours. Thyroid Function Tests: No results for input(s): TSH, T4TOTAL, FREET4, T3FREE, THYROIDAB in the last 72 hours. Anemia Panel: No results for input(s): VITAMINB12, FOLATE, FERRITIN, TIBC, IRON, RETICCTPCT in the last 72  hours. Urine analysis:    Component Value Date/Time   COLORURINE YELLOW 01/09/2014 1843   APPEARANCEUR CLEAR 01/09/2014 1843   LABSPEC 1.023 01/09/2014 1843   PHURINE 5.5 01/09/2014 1843   GLUCOSEU 250* 01/09/2014 1843   HGBUR NEGATIVE 01/09/2014 1843   BILIRUBINUR NEGATIVE 01/09/2014 1843   KETONESUR NEGATIVE 01/09/2014 1843   PROTEINUR NEGATIVE 01/09/2014 1843   UROBILINOGEN 0.2 01/09/2014 1843   NITRITE NEGATIVE 01/09/2014 1843   LEUKOCYTESUR NEGATIVE 01/09/2014 1843   Sepsis Labs: Invalid input(s): PROCALCITONIN, LACTICIDVEN  No results found for this or any previous visit (from the past 240 hour(s)).    Radiology Studies: No results found.   Scheduled Meds: . bisoprolol  10 mg Oral Daily  . budesonide (PULMICORT) nebulizer solution  0.5 mg Nebulization BID  . calcium-vitamin D  1 tablet Oral q morning - 10a  . cholecalciferol  400 Units Oral Daily  . diltiazem  120 mg Oral Daily  . doxycycline (VIBRAMYCIN) IV  100 mg Intravenous Q12H  . enoxaparin (LOVENOX) injection  40 mg Subcutaneous Q24H  . ferrous sulfate  325 mg Oral BID WC  . guaiFENesin  600 mg Oral BID  . ipratropium-albuterol  3 mL Nebulization QID  . methylPREDNISolone (SOLU-MEDROL) injection  60 mg Intravenous Q12H  . multivitamin with minerals  1 tablet Oral q morning - 10a  . pantoprazole  40 mg Oral Daily  . sodium chloride flush  3 mL Intracatheter Q12H   Continuous Infusions:   Pamella Pert, MD, PhD [facility] Pager [phone]  If 7PM-7AM, please contact night-coverage [email or web address] Password [facility] 07/24/2015, 12:46 PM

## 2015-07-25 NOTE — Consult Note (Signed)
Name: [name] Screws MRN: [id number] DOB: 02/05/1965    ADMISSION DATE:  07/21/2015 CONSULTATION DATE:  07/24/15  REFERRING MD :  Dr. Elvera LennoxGherghe  CHIEF COMPLAINT:  Shortness of Breath    HISTORY OF PRESENT ILLNESS:  50 y/o M, well known to PCCM service (followed by Dr. Delton CoombesByrum) with a PMH of HTN, substance abuse (prior cocaine), intermittent non-compliance, OSA on CPAP, sarcoidosis, chronic hypoxic respiratory failure on 4L and bronchiectasis who [facility]  On 7/10 with complaints of increased left sided pleuritic pain, cough and dyspnea.  The patient reports that he had several days of progressive worsening of shortness of breath, increased cough with yellow sputum production, pleuritic chest pain with coughing/bending over and fatigue.  He denies fevers, chills, n/v/d, headache.  On presentation, he was found to be hypoxic on baseline 4L to 75-80%.  This resolved with rest and coaching of deep breathing.  Initial CXR showed known scarring, PNA not ruled out with scarring.  The patient was admitted for further evaluation of dyspnea.  He was treated with IV steroids, doxycycline and nebulized steroids.    He was last seen in April 2017 in the pulmonary office and at that time was noted to have difficulty obtaining medications.  He was instructed to discuss with insurance regarding cost / coverage options.  Unfortunately, he has been off his medications since that time. He has been using PRN albuterol only and reports "using it way too much".    PCCM consulted for evaluation of dyspnea.     SUBJECTIVE:  Pt reports feeling better but not well.  Asking to walk today.  No acute events overnight.   VITAL SIGNS: Temp:  [97.7 F (36.5 C)-98.6 F (37 C)] 97.7 F (36.5 C) (07/14 0606) Pulse Rate:  [62-96] 62 (07/14 0606) Resp:  [18-22] 18 (07/14 0606) BP: (129-145)/(86-97) 137/97 mmHg (07/14 0606) SpO2:  [98 %-100 %] 98 % (07/14 0801)  PHYSICAL EXAMINATION: General:  Well developed adult  male in NAD Neuro:  AAOx4, speech clear, MAE  HEENT:  MM pink/moist, no JVD Cardiovascular:  s1s2 rrr, no m/r/g Lungs: mild tachypnea, wheezing resolved, coarse breath sounds bilaterally Abdomen:  Obese/soft, BSx4 active  Musculoskeletal:  No acute deformities  Skin:  Warm/dry, no edema    Recent Labs Lab 07/21/15 1121 07/22/15 0532  NA 139 136  K 3.8 4.4  CL 99* 102  CO2 31 25  BUN 10 12  CREATININE 1.00 0.96  GLUCOSE 86 164*    Recent Labs Lab 07/21/15 1121 07/22/15 0532  HGB 12.8* 11.4*  HCT 39.1 35.6*  WBC 5.2 6.3  PLT 269 223   No results found.   SIGNIFICANT EVENTS  7/10  Admit with dyspnea  7/13  PCCM consulted as patient is slow to improve  STUDIES:  CXR & CT images personally reviewed back to 09/2014.   ASSESSMENT / PLAN:  1.  Acute Bronchiectasis Exacerbation   2.  Sarcoidosis - significant scarring / bullous disease  3.  Acute on Chronic Hypoxic Respiratory Failure   4.  Dyspnea - secondary to above   5.  OSA on CPAP    Plan: Budesonide 0.5 BID Continue Q4 Duoneb Vibra vest, flutter valve for secretion clearance Assess sputum for culture, AFB Continue empiric doxycycline  Continue solumedrol 60mg  IV Q12, will need slow prednisone taper to off PPI Will ask case manager to review insurance and help determine most affordable option of medication (Symbicort, Advair etc) for maintenance therapy.  Continue CPAP QHS &  PRN sleep  Tessalon for cough Ambulate in halls as tolerated Will need outpatient pulmonary follow up.  He currently has an appt with Dr. Delton Coombes on 7/17 if he is discharged over the weekend.    PCCM will see again on 7/17 if he remains in the hospital. Please call sooner if new needs arise.    Canary Brim, NP-C [facility] Pgr: [phone] or if no answer [phone] 07/25/2015, 10:03 AM

## 2015-07-25 NOTE — Progress Notes (Signed)
Patient will place CPAP on when ready for bed. RT will monitor.

## 2015-07-25 NOTE — Progress Notes (Signed)
PROGRESS NOTE  [name] ZOX:[id number] DOB: 03/18/1965 DOA: 07/21/2015 PCP: Clydie BraunENIZARD-THOMPSON, NANCY, MD   LOS: 4 days   Brief Narrative: 50 y.o. male with medical history significant of copd on home oxygen, gerd, sarcoidosis, HTN. Presenting with SOB, doe, and increase complaints of cough and sputum production  Assessment & Plan: Active Problems:   GERD (gastroesophageal reflux disease)   Essential hypertension   COPD exacerbation (HCC)   COPD exacerbation (HCC) / Sarcoidosis -continue current medical regimen with IV steroids, antibiotics.  - no significant wheezing but lung sounds quite distant breath sounds - since no improvement, consulted Pulmonary 7/13, appreciate input  Chronic hypoxic respiratory failure - on chronic home O2, continue - remains quite symptomatic. Encouraged more ambulation today   GERD (gastroesophageal reflux disease) - Protonix  Essential hypertension - Patient on bisoprolol, diltiazem   DVT prophylaxis: Lovenox Code Status: Full Family Communication: no family bedside Disposition Plan: home pending improvement   Consultants:   Pulmonary  Procedures:   None   Antimicrobials:  Doxycycline 7/10 >>  Subjective: - no chest pain, no abdominal pain, nausea or vomiting.  - ongoing dyspnea, no improvement but hasn't done much anyway  Objective: Filed Vitals:   07/24/15 2056 07/25/15 0606 07/25/15 0752 07/25/15 0801  BP: 137/92 137/97    Pulse: 76 62    Temp: 98.4 F (36.9 C) 97.7 F (36.5 C)    TempSrc: Oral Oral    Resp: 18 18    Height:      Weight:      SpO2: 100% 100% 98% 98%    Intake/Output Summary (Last 24 hours) at 07/25/15 1122 Last data filed at 07/25/15 1039  Gross per 24 hour  Intake    623 ml  Output    250 ml  Net    373 ml   Filed Weights   07/21/15 0958  Weight: 75.751 kg (167 lb)    Examination: Constitutional: NAD Filed Vitals:   07/24/15 2056 07/25/15 0606 07/25/15 0752 07/25/15 0801  BP:  137/92 137/97    Pulse: 76 62    Temp: 98.4 F (36.9 C) 97.7 F (36.5 C)    TempSrc: Oral Oral    Resp: 18 18    Height:      Weight:      SpO2: 100% 100% 98% 98%   Eyes: PERRL, lids and conjunctivae normal Respiratory: scant wheezing cleared with coughing, distant breath sounds. No crackles Cardiovascular: Regular rate and rhythm, no murmurs / rubs / gallops.   Abdomen: no tenderness. Bowel sounds positive.    Data Reviewed: I have personally reviewed following labs and imaging studies  CBC:  Recent Labs Lab 07/21/15 1121 07/22/15 0532  WBC 5.2 6.3  HGB 12.8* 11.4*  HCT 39.1 35.6*  MCV 82.7 83.0  PLT 269 223   Basic Metabolic Panel:  Recent Labs Lab 07/21/15 1121 07/22/15 0532  NA 139 136  K 3.8 4.4  CL 99* 102  CO2 31 25  GLUCOSE 86 164*  BUN 10 12  CREATININE 1.00 0.96  CALCIUM 9.3 9.2   Urine analysis:    Component Value Date/Time   COLORURINE YELLOW 01/09/2014 1843   APPEARANCEUR CLEAR 01/09/2014 1843   LABSPEC 1.023 01/09/2014 1843   PHURINE 5.5 01/09/2014 1843   GLUCOSEU 250* 01/09/2014 1843   HGBUR NEGATIVE 01/09/2014 1843   BILIRUBINUR NEGATIVE 01/09/2014 1843   KETONESUR NEGATIVE 01/09/2014 1843   PROTEINUR NEGATIVE 01/09/2014 1843   UROBILINOGEN 0.2 01/09/2014 1843   NITRITE NEGATIVE  01/09/2014 1843   LEUKOCYTESUR NEGATIVE 01/09/2014 1843   Sepsis Labs: Invalid input(s): PROCALCITONIN, LACTICIDVEN  Recent Results (from the past 240 hour(s))  Culture, expectorated sputum-assessment     Status: None   Collection Time: 07/24/15  4:30 PM  Result Value Ref Range Status   Specimen Description SPUTUM  Final   Special Requests NONE  Final   Sputum evaluation   Final    THIS SPECIMEN IS ACCEPTABLE. RESPIRATORY CULTURE REPORT TO FOLLOW.   Report Status 07/24/2015 FINAL  Final  Culture, respiratory (NON-Expectorated)     Status: None (Preliminary result)   Collection Time: 07/24/15  4:30 PM  Result Value Ref Range Status   Specimen  Description SPUTUM  Final   Special Requests NONE  Final   Gram Stain   Final    ABUNDANT WBC PRESENT,BOTH PMN AND MONONUCLEAR ABUNDANT GRAM POSITIVE COCCI IN PAIRS IN CHAINS MODERATE GRAM VARIABLE ROD ABUNDANT YEAST ABUNDANT SQUAMOUS EPITHELIAL CELLS PRESENT    Culture   Final    TOO YOUNG TO READ Performed at [facility]    Report Status PENDING  Incomplete      Radiology Studies: No results found.   Scheduled Meds: . bisoprolol  10 mg Oral Daily  . budesonide (PULMICORT) nebulizer solution  0.5 mg Nebulization BID  . calcium-vitamin D  1 tablet Oral q morning - 10a  . cholecalciferol  400 Units Oral Daily  . diltiazem  120 mg Oral Daily  . doxycycline (VIBRAMYCIN) IV  100 mg Intravenous Q12H  . enoxaparin (LOVENOX) injection  40 mg Subcutaneous Q24H  . ferrous sulfate  325 mg Oral BID WC  . guaiFENesin  600 mg Oral BID  . ipratropium-albuterol  3 mL Nebulization QID  . methylPREDNISolone (SOLU-MEDROL) injection  60 mg Intravenous Q12H  . multivitamin with minerals  1 tablet Oral q morning - 10a  . pantoprazole  40 mg Oral Daily  . sodium chloride flush  3 mL Intracatheter Q12H   Continuous Infusions:   Pamella Pert, MD, PhD [facility] Pager [phone]  If 7PM-7AM, please contact night-coverage [email or web address] Password [email or web address] 07/25/2015, 11:22 AM

## 2015-07-25 NOTE — Progress Notes (Signed)
Received a referral to assist with medication issue related to cost of Symbicort inhaler. Spoke with patient, he states his insurance doesn't pay for this inhaler. [facility], Symbicort is covered but is a tier 3 inhaler so out of pocket cost will be higher, patient states his cost is $40. Discussed patient assistance programs with the patient but not sure if he will qualify d/t finances and current insurance coverage. Discussed with Dr. Delton CoombesByrum and pharmacist. Encourage patient to ask for assistance from family or girlfriend, also he should discuss options with Dr. Delton CoombesByrum.

## 2015-07-26 DIAGNOSIS — I10 Essential (primary) hypertension: Secondary | ICD-10-CM

## 2015-07-26 LAB — CULTURE, RESPIRATORY W GRAM STAIN

## 2015-07-26 LAB — CULTURE, RESPIRATORY: CULTURE: NORMAL

## 2015-07-26 LAB — ACID FAST SMEAR (AFB): ACID FAST SMEAR - AFSCU2: NEGATIVE

## 2015-07-26 MED ORDER — PREDNISONE 20 MG PO TABS: 60.0000 mg | ORAL_TABLET | Freq: Every day | ORAL | Status: DC

## 2015-07-26 MED ORDER — DOXYCYCLINE HYCLATE 100 MG PO CAPS: 100.0000 mg | ORAL_CAPSULE | Freq: Two times a day (BID) | ORAL | Status: DC

## 2015-07-26 MED ORDER — PREDNISONE 50 MG PO TABS
50.0000 mg | ORAL_TABLET | Freq: Every day | ORAL | Status: DC
  Administered 2015-07-26: 50 mg via ORAL
  Filled 2015-07-26: qty 1

## 2015-07-26 MED ORDER — GUAIFENESIN ER 600 MG PO TB12: 600.0000 mg | ORAL_TABLET | Freq: Two times a day (BID) | ORAL | Status: DC

## 2015-07-26 NOTE — Discharge Instructions (Signed)
Follow with DENIZARD-THOMPSON, Harriett SineNANCY, MD in 5-7 days  Please get a complete blood count and chemistry panel checked by your Primary MD at your next visit, and again as instructed by your Primary MD. Please get your medications reviewed and adjusted by your Primary MD.  Please request your Primary MD to go over all Hospital Tests and Procedure/Radiological results at the follow up, please get all Hospital records sent to your Prim MD by signing hospital release before you go home.  If you had Pneumonia of Lung problems at the Hospital: Please get a 2 view Chest X ray done in 6-8 weeks after hospital discharge or sooner if instructed by your Primary MD.  If you have Congestive Heart Failure: Please call your Cardiologist or Primary MD anytime you have any of the following symptoms:  1) 3 pound weight gain in 24 hours or 5 pounds in 1 week  2) shortness of breath, with or without a dry hacking cough  3) swelling in the hands, feet or stomach  4) if you have to sleep on extra pillows at night in order to breathe  Follow cardiac low salt diet and 1.5 lit/day fluid restriction.  If you have diabetes Accuchecks 4 times/day, Once in AM empty stomach and then before each meal. Log in all results and show them to your primary doctor at your next visit. If any glucose reading is under 80 or above 300 call your primary MD immediately.  If you have Seizure/Convulsions/Epilepsy: Please do not drive, operate heavy machinery, participate in activities at heights or participate in high speed sports until you have seen by Primary MD or a Neurologist and advised to do so again.  If you had Gastrointestinal Bleeding: Please ask your Primary MD to check a complete blood count within one week of discharge or at your next visit. Your endoscopic/colonoscopic biopsies that are pending at the time of discharge, will also need to followed by your Primary MD.  Get Medicines reviewed and adjusted. Please take all  your medications with you for your next visit with your Primary MD  Please request your Primary MD to go over all hospital tests and procedure/radiological results at the follow up, please ask your Primary MD to get all Hospital records sent to his/her office.  If you experience worsening of your admission symptoms, develop shortness of breath, life threatening emergency, suicidal or homicidal thoughts you must seek medical attention immediately by calling 911 or calling your MD immediately  if symptoms less severe.  You must read complete instructions/literature along with all the possible adverse reactions/side effects for all the Medicines you take and that have been prescribed to you. Take any new Medicines after you have completely understood and accpet all the possible adverse reactions/side effects.   Do not drive or operate heavy machinery when taking Pain medications.   Do not take more than prescribed Pain, Sleep and Anxiety Medications  Special Instructions: If you have smoked or chewed Tobacco  in the last 2 yrs please stop smoking, stop any regular Alcohol  and or any Recreational drug use.  Wear Seat belts while driving.  Please note You were cared for by a hospitalist during your hospital stay. If you have any questions about your discharge medications or the care you received while you were in the hospital after you are discharged, you can call the unit and asked to speak with the hospitalist on call if the hospitalist that took care of you is not available. Once  you are discharged, your primary care physician will handle any further medical issues. Please note that NO REFILLS for any discharge medications will be authorized once you are discharged, as it is imperative that you return to your primary care physician (or establish a relationship with a primary care physician if you do not have one) for your aftercare needs so that they can reassess your need for medications and monitor  your lab values.  You can reach the hospitalist office at phone [phone] or fax [phone]   If you do not have a primary care physician, you can call [phone] for a physician referral.  Activity: As tolerated with Full fall precautions use walker/cane & assistance as needed  Diet: regular  Disposition Home

## 2015-07-26 NOTE — Discharge Summary (Signed)
Physician Discharge Summary  [name] ZOX:[id number] DOB: [birth year] DOA: 07/21/2015  PCP: Clydie Braun, MD  Admit date: 07/21/2015 Discharge date: 07/26/2015  Admitted From: home Disposition:  home  Recommendations for Outpatient Follow-up:  1. Follow up with Dr. Delton Coombes in 2 days as scheduled 2. Doxycycline for 5 additional days 3. Prednisone taper over 2 weeks  Discharge Condition: stable CODE STATUS: Full Diet recommendation: regular  HPI: 50 y.o. male with medical history significant of copd on home oxygen, gerd, sarcoidosis, HTN. Presenting with SOB, doe, and increase complaints of cough and sputum production  Hospital Course: Discharge Diagnoses:  Active Problems:   GERD (gastroesophageal reflux disease)   Essential hypertension   COPD exacerbation (HCC)  COPD exacerbation (HCC) / Sarcoidosis - patient was admitted to the hospital with COPD exacerbation, placed on antibiotics, nebulizers and steroids. He did not improve as expected and Pulmonary was consulted and have seen patient while hospitalized. By HOD 5, his condition eventually improved, returned to baseline respiratory status and was able to ambulate 2 dozen laps in the hospital unit. He was discharged home in stable condition, with 5 additional days of antibiotics, steroid taper and is to see Dr. Delton Coombes with Pulmonary in 2 days in clinic.  Chronic hypoxic respiratory failure - on chronic home O2, continue GERD (gastroesophageal reflux disease) - continue home medications Essential hypertension - continue home medications   Discharge Instructions     Medication List    TAKE these medications        albuterol (2.5 MG/3ML) 0.083% nebulizer solution  Commonly known as:  PROVENTIL  Take 2.5 mg by nebulization every 4 (four) hours as needed for wheezing or shortness of breath (wheezing and shortness of breath).     VENTOLIN HFA 108 (90 Base) MCG/ACT inhaler  Generic drug:  albuterol  INHALE TWO  PUFFS BY MOUTH EVERY 6 HOURS AS NEEDED FOR WHEEZING OR SHORTNESS OF BREATH     bisacodyl 5 MG EC tablet  Commonly known as:  bisacodyl  Take 1 tablet (5 mg total) by mouth daily as needed for moderate constipation.     bisoprolol 10 MG tablet  Commonly known as:  ZEBETA  Take 1 tablet (10 mg total) by mouth daily.     calcium-vitamin D 500-200 MG-UNIT tablet  Commonly known as:  OSCAL WITH D  Take 1 tablet by mouth every morning.     cyclobenzaprine 10 MG tablet  Commonly known as:  FLEXERIL  Take 10 mg by mouth 3 (three) times daily as needed for muscle spasms (muscle spasms).     diltiazem 120 MG 24 hr capsule  Commonly known as:  CARDIZEM CD  Take 1 capsule (120 mg total) by mouth daily.     diphenhydrAMINE 25 MG tablet  Commonly known as:  BENADRYL  Take 25 mg by mouth every 6 (six) hours as needed for allergies (allergies).     doxycycline 100 MG capsule  Commonly known as:  VIBRAMYCIN  Take 1 capsule (100 mg total) by mouth 2 (two) times daily. One po bid x 7 days     ferrous sulfate 325 (65 FE) MG tablet  Take 1 tablet (325 mg total) by mouth 2 (two) times daily with a meal.     fluticasone 50 MCG/ACT nasal spray  Commonly known as:  FLONASE  Place 2 sprays into both nostrils 2 (two) times daily.     guaiFENesin 600 MG 12 hr tablet  Commonly known as:  MUCINEX  Take 1  tablet (600 mg total) by mouth 2 (two) times daily.     magnesium citrate Soln  Take 1 Bottle by mouth daily as needed for mild constipation or severe constipation.     multivitamin with minerals tablet  Take 1 tablet by mouth every morning.     omeprazole 40 MG capsule  Commonly known as:  PRILOSEC  Take 40 mg by mouth daily.     ondansetron 4 MG tablet  Commonly known as:  ZOFRAN  Take 4 mg by mouth every 8 (eight) hours as needed for nausea or vomiting.     polyethylene glycol packet  Commonly known as:  MIRALAX / GLYCOLAX  Take 17 g by mouth daily.     predniSONE 20 MG tablet    Commonly known as:  DELTASONE  Take 3 tablets (60 mg total) by mouth daily with breakfast. 60 mg for 2 days then 50 mg for 3 days then 40 mg for 3 days then 20 mg for 3 days then 10 mg for 3 days     Vitamin D2 400 units Tabs  Take 400 Units by mouth daily.           Follow-up Information    Follow up with Leslye Peer., MD In 2 days.   Specialty:  Pulmonary Disease   Contact information:   [address] [place] [address] [phone]      Allergies  Allergen Reactions  . Clindamycin/Lincomycin Anaphylaxis and Swelling    Made face and tongue swell    Consultations:  Pulmonary  Procedures/Studies:  None   Dg Chest 2 View  07/21/2015  CLINICAL DATA:  History of sarcoidosis.  Difficulty breathing EXAM: CHEST  2 VIEW COMPARISON:  April 24, 2015 FINDINGS: Widespread cicatrization is noted, stable. There are areas of probable chronic mucous plugging with patchy opacity throughout the lungs, most severe in the mid lower lung zones, stable. There is no new opacity appreciable. The heart size and pulmonary vascularity are normal. No adenopathy is demonstrable. No bone lesions. IMPRESSION: No appreciable change from study performed 3 months prior. Extensive scarring and areas of probable chronic mucous plugging. No new opacity. No change in cardiac silhouette. These changes are consistent with known history of sarcoidosis. It should be noted that subtle pneumonia could easily be obscured given the degree of scarring and opacity present currently. Electronically Signed   By: Bretta Bang III M.D.   On: 07/21/2015 10:25     Subjective: - no chest pain, shortness of breath, no abdominal pain, nausea or vomiting. Ambulating, feels back to baseline  Discharge Exam: Filed Vitals:   07/25/15 2133 07/26/15 0607  BP: 134/95 135/95  Pulse: 80 71  Temp: 98.3 F (36.8 C) 97.7 F (36.5 C)  Resp: 18 18   Filed Vitals:   07/25/15 2133 07/26/15 0607 07/26/15 0804 07/26/15  0805  BP: 134/95 135/95    Pulse: 80 71    Temp: 98.3 F (36.8 C) 97.7 F (36.5 C)    TempSrc: Oral Oral    Resp: 18 18    Height:      Weight:      SpO2: 100% 100% 100% 100%    General: Pt is alert, awake, not in acute distress Cardiovascular: RRR, S1/S2 +, no rubs, no gallops Respiratory: CTA bilaterally, no wheezing, no rhonchi Abdominal: Soft, NT, ND, bowel sounds + Extremities: no edema, no cyanosis    The results of significant diagnostics from this hospitalization (including imaging, microbiology, ancillary and  laboratory) are listed below for reference.     Microbiology: Recent Results (from the past 240 hour(s))  Culture, expectorated sputum-assessment     Status: None   Collection Time: 07/24/15  4:30 PM  Result Value Ref Range Status   Specimen Description SPUTUM  Final   Special Requests NONE  Final   Sputum evaluation   Final    THIS SPECIMEN IS ACCEPTABLE. RESPIRATORY CULTURE REPORT TO FOLLOW.   Report Status 07/24/2015 FINAL  Final  Culture, respiratory (NON-Expectorated)     Status: None   Collection Time: 07/24/15  4:30 PM  Result Value Ref Range Status   Specimen Description SPUTUM  Final   Special Requests NONE  Final   Gram Stain   Final    ABUNDANT WBC PRESENT,BOTH PMN AND MONONUCLEAR ABUNDANT GRAM POSITIVE COCCI IN PAIRS IN CHAINS MODERATE GRAM VARIABLE ROD ABUNDANT YEAST ABUNDANT SQUAMOUS EPITHELIAL CELLS PRESENT    Culture   Final    Consistent with normal respiratory flora. [facility]    Report Status 07/26/2015 FINAL  Final     Labs: BNP (last 3 results)  Recent Labs  01/13/15 1205  BNP 11.8   Basic Metabolic Panel:  Recent Labs Lab 07/21/15 1121 07/22/15 0532  NA 139 136  K 3.8 4.4  CL 99* 102  CO2 31 25  GLUCOSE 86 164*  BUN 10 12  CREATININE 1.00 0.96  CALCIUM 9.3 9.2   Liver Function Tests: No results for input(s): AST, ALT, ALKPHOS, BILITOT, PROT, ALBUMIN in the last 168 hours. No  results for input(s): LIPASE, AMYLASE in the last 168 hours. No results for input(s): AMMONIA in the last 168 hours. CBC:  Recent Labs Lab 07/21/15 1121 07/22/15 0532  WBC 5.2 6.3  HGB 12.8* 11.4*  HCT 39.1 35.6*  MCV 82.7 83.0  PLT 269 223   Cardiac Enzymes: No results for input(s): CKTOTAL, CKMB, CKMBINDEX, TROPONINI in the last 168 hours. BNP: Invalid input(s): POCBNP CBG: No results for input(s): GLUCAP in the last 168 hours. D-Dimer No results for input(s): DDIMER in the last 72 hours. Hgb A1c No results for input(s): HGBA1C in the last 72 hours. Lipid Profile No results for input(s): CHOL, HDL, LDLCALC, TRIG, CHOLHDL, LDLDIRECT in the last 72 hours. Thyroid function studies No results for input(s): TSH, T4TOTAL, T3FREE, THYROIDAB in the last 72 hours.  Invalid input(s): FREET3 Anemia work up No results for input(s): VITAMINB12, FOLATE, FERRITIN, TIBC, IRON, RETICCTPCT in the last 72 hours. Urinalysis    Component Value Date/Time   COLORURINE YELLOW 01/09/2014 1843   APPEARANCEUR CLEAR 01/09/2014 1843   LABSPEC 1.023 01/09/2014 1843   PHURINE 5.5 01/09/2014 1843   GLUCOSEU 250* 01/09/2014 1843   HGBUR NEGATIVE 01/09/2014 1843   BILIRUBINUR NEGATIVE 01/09/2014 1843   KETONESUR NEGATIVE 01/09/2014 1843   PROTEINUR NEGATIVE 01/09/2014 1843   UROBILINOGEN 0.2 01/09/2014 1843   NITRITE NEGATIVE 01/09/2014 1843   LEUKOCYTESUR NEGATIVE 01/09/2014 1843   Sepsis Labs Invalid input(s): PROCALCITONIN,  WBC,  LACTICIDVEN Microbiology Recent Results (from the past 240 hour(s))  Culture, expectorated sputum-assessment     Status: None   Collection Time: 07/24/15  4:30 PM  Result Value Ref Range Status   Specimen Description SPUTUM  Final   Special Requests NONE  Final   Sputum evaluation   Final    THIS SPECIMEN IS ACCEPTABLE. RESPIRATORY CULTURE REPORT TO FOLLOW.   Report Status 07/24/2015 FINAL  Final  Culture, respiratory (NON-Expectorated)  Status: None    Collection Time: 07/24/15  4:30 PM  Result Value Ref Range Status   Specimen Description SPUTUM  Final   Special Requests NONE  Final   Gram Stain   Final    ABUNDANT WBC PRESENT,BOTH PMN AND MONONUCLEAR ABUNDANT GRAM POSITIVE COCCI IN PAIRS IN CHAINS MODERATE GRAM VARIABLE ROD ABUNDANT YEAST ABUNDANT SQUAMOUS EPITHELIAL CELLS PRESENT    Culture   Final    Consistent with normal respiratory flora. Performed at [facility]    Report Status 07/26/2015 FINAL  Final     Time coordinating discharge: Over 30 minutes  SIGNED:  Pamella Pert, MD  [facility] 07/26/2015, 1:33 PM Pager [phone]  If 7PM-7AM, please contact night-coverage [email or web address] Password [email or web address]

## 2015-07-26 NOTE — Progress Notes (Signed)
Patient discharged.  Leaving with three prescriptions and his personal belongings.  4L [place] which he uses at home.  Denies pain.  No s/s of distress.  Reports understanding of his discharge instructions.  No complaints.

## 2015-07-28 ENCOUNTER — Encounter: Payer: Self-pay | Admitting: Emergency Medicine

## 2015-07-28 ENCOUNTER — Ambulatory Visit (INDEPENDENT_AMBULATORY_CARE_PROVIDER_SITE_OTHER): Payer: Commercial Managed Care - HMO | Admitting: Emergency Medicine

## 2015-07-28 VITALS — BP 130/92 | HR 97 | Ht 67.5 in | Wt 170.0 lb

## 2015-07-28 DIAGNOSIS — J441 Chronic obstructive pulmonary disease with (acute) exacerbation: Secondary | ICD-10-CM | POA: Diagnosis not present

## 2015-07-28 NOTE — Assessment & Plan Note (Signed)
Resolving COPD Flare Plan: Follow up CXR on the way out.  We will call you with results. Continue your Doxycycline and Prednisone taper until gone. Continue using your Symbicort 2 puffs twice daily. Rinse mouth after use. Continue your Neb treatments with your Chest Vest three times daily. Continue your Mucinex for chest congestion Continue on CPAP at bedtime. You appear to be benefiting from the treatment Goal is to wear for at least 4-6 hours each night for maximal clinical benefit. Continue to work on weight loss, as the link between excess weight  and sleep apnea is well established.  Do not drive if sleepy. Follow up in 4-6 weeks with either myself or Dr. Delton CoombesByrum.. Please contact office for sooner follow up if symptoms do not improve or worsen or seek emergency care

## 2015-07-28 NOTE — Progress Notes (Signed)
History of Present Illness Damein Gaunce is a 50 y.o. male never smoker with stage IV sarcoidosis and bronchiectasis and chronic respiratory failure with chronic hypoxemia needing home oxygen and sleep apnea needing CPAP. History of polysubstance abuse.   7/17/2017Hospital Follow Up Appointment: Pt. Presents to the office for hospital follow up. He was admitted 7/10-7/15/2017 for HCAP with COPD Exacerbation. He was treated with Antibiotics, scheduled nebulizer treatments , and steroids. He was discharged home at his baseline pulmonary status  with additional Doxycycline x 5 days  and  2 weeks of prednisone taper.He wearing his 4 L [place] 24/7.He states he is compliant with his Doxycycline and your prednisone taper. He is compliant with his Ventolin nebs 3 times daily with chest vest. ( 3 x daily for 30 minutes on 16/6) He is also compliant with his Symbicort and Mucinex. He states he is doing well since discharge. Secretions are clear to yellow relatively thin. The amount of secretions have also significantly lessened.His cough is getting better. He is using his Tussin ex every 6 hours.He denies fever, chest pain, orthopnea, hemoptysis, leg or calf pain. He states he is compliant with his CPAP. He states he has no daytime sleepiness, and appears to be deriving benefit from treatment.  CPAP Down Load:07/28/2015  Usage:  25/30 days ( 83%) >4 H x 21 days Average Use 5 H 31 minutes. AHI= 1.3 Auto set 5-10 cm H2O  Significant Information:  Admit date: 07/21/2015 Discharge date: 07/26/2015  Admitted From: home Disposition: home  Recommendations for Outpatient Follow-up:  1. Follow up with Dr. Delton Coombes in 2 days as scheduled 2. Doxycycline for 5 additional days 3. Prednisone taper over 2 weeks  Tests:  CXR  07/21/2015: No appreciable change from study performed 3 months prior. Extensive scarring and areas of probable chronic mucous plugging. No new opacity. No change in cardiac silhouette. These  changes are consistent with known history of sarcoidosis. It should be noted that subtle pneumonia could easily be obscured given the degree of scarring and opacity present currently.   Past medical hx Past Medical History  Diagnosis Date  . Hypertension   . Sarcoidosis (HCC)   . OSA on CPAP   . Chronic respiratory failure (HCC)     home O@ 4L (01/28/14)  . Bronchitis   . Pneumonia   . COPD (chronic obstructive pulmonary disease) (HCC)   . Neuromuscular disorder [facility])      Past surgical hx, Family hx, Social hx all reviewed.  Current Outpatient Prescriptions on File Prior to Visit  Medication Sig  . albuterol (PROVENTIL) (2.5 MG/3ML) 0.083% nebulizer solution Take 2.5 mg by nebulization every 4 (four) hours as needed for wheezing or shortness of breath (wheezing and shortness of breath).   . bisacodyl (BISACODYL) 5 MG EC tablet Take 1 tablet (5 mg total) by mouth daily as needed for moderate constipation.  . bisoprolol (ZEBETA) 10 MG tablet Take 1 tablet (10 mg total) by mouth daily.  . calcium-vitamin D (OSCAL WITH D) 500-200 MG-UNIT per tablet Take 1 tablet by mouth every morning.  . cyclobenzaprine (FLEXERIL) 10 MG tablet Take 10 mg by mouth 3 (three) times daily as needed for muscle spasms (muscle spasms).   Marland Kitchen diltiazem (CARDIZEM CD) 120 MG 24 hr capsule Take 1 capsule (120 mg total) by mouth daily.  . diphenhydrAMINE (BENADRYL) 25 MG tablet Take 25 mg by mouth every 6 (six) hours as needed for allergies (allergies).   Marland Kitchen doxycycline (VIBRAMYCIN) 100 MG capsule Take  1 capsule (100 mg total) by mouth 2 (two) times daily. One po bid x 7 days  . Ergocalciferol (VITAMIN D2) 400 units TABS Take 400 Units by mouth daily.  . ferrous sulfate 325 (65 FE) MG tablet Take 1 tablet (325 mg total) by mouth 2 (two) times daily with a meal.  . fluticasone (FLONASE) 50 MCG/ACT nasal spray Place 2 sprays into both nostrils 2 (two) times daily. (Patient taking differently: Place 2 sprays into both  nostrils 2 (two) times daily as needed for allergies. )  . guaiFENesin (MUCINEX) 600 MG 12 hr tablet Take 1 tablet (600 mg total) by mouth 2 (two) times daily.  . magnesium citrate SOLN Take 1 Bottle by mouth daily as needed for mild constipation or severe constipation.  . Multiple Vitamins-Minerals (MULTIVITAMIN WITH MINERALS) tablet Take 1 tablet by mouth every morning.   Marland Kitchen omeprazole (PRILOSEC) 40 MG capsule Take 40 mg by mouth daily.  . ondansetron (ZOFRAN) 4 MG tablet Take 4 mg by mouth every 8 (eight) hours as needed for nausea or vomiting.   . polyethylene glycol (MIRALAX / GLYCOLAX) packet Take 17 g by mouth daily. (Patient taking differently: Take 17 g by mouth daily as needed for mild constipation or moderate constipation. )  . predniSONE (DELTASONE) 20 MG tablet Take 3 tablets (60 mg total) by mouth daily with breakfast. 60 mg for 2 days then 50 mg for 3 days then 40 mg for 3 days then 20 mg for 3 days then 10 mg for 3 days  . VENTOLIN HFA 108 (90 Base) MCG/ACT inhaler INHALE TWO PUFFS BY MOUTH EVERY 6 HOURS AS NEEDED FOR WHEEZING OR SHORTNESS OF BREATH   No current facility-administered medications on file prior to visit.     Allergies  Allergen Reactions  . Clindamycin/Lincomycin Anaphylaxis and Swelling    Made face and tongue swell    Review Of Systems:  Constitutional:   No  weight loss, night sweats,  Fevers, chills, fatigue, or  lassitude.  HEENT:   No headaches,  Difficulty swallowing,  Tooth/dental problems, or  Sore throat,                No sneezing, itching, ear ache, nasal congestion, post nasal drip,   CV:  No chest pain,  Orthopnea, PND, swelling in lower extremities, anasarca, dizziness, palpitations, syncope.   GI  No heartburn, indigestion, abdominal pain, nausea, vomiting, diarrhea, change in bowel habits, loss of appetite, bloody stools.   Resp: + shortness of breath with exertion and  at rest.  + excess mucus, + productive cough,  No non-productive cough,   No coughing up of blood.  + change in color of mucus.  + wheezing.  No chest wall deformity  Skin: no rash or lesions.  GU: no dysuria, change in color of urine, no urgency or frequency.  No flank pain, no hematuria   MS:  No joint pain or swelling.  No decreased range of motion.  No back pain.  Psych:  No change in mood or affect. No depression or anxiety.  No memory loss.   Vital Signs BP 130/92 mmHg  Pulse 97  Ht 5' 7.5" (1.715 m)  Wt 170 lb (77.111 kg)  BMI 26.22 kg/m2  SpO2 99%   Physical Exam:  General- No distress,  A&Ox3, pleasant ENT: No sinus tenderness, TM clear, pale nasal mucosa, no oral exudate,no post nasal drip, no LAN Cardiac: S1, S2, regular rate and rhythm, no murmur Chest: + wheeze/  no rales/ diminished per bases bilaterally,; + accessory muscle use, no nasal flaring, no sternal retractions Abd.: Soft Non-tender Ext: No clubbing cyanosis, trace edema Neuro:  normal strength Skin: No rashes, warm and dry Psych: normal mood and behavior   Assessment/Plan  COPD exacerbation (HCC) Resolving COPD Flare Plan: Follow up CXR on the way out.  We will call you with results. Continue your Doxycycline and Prednisone taper until gone. Continue using your Symbicort 2 puffs twice daily. Rinse mouth after use. Continue your Neb treatments with your Chest Vest three times daily. Continue your Mucinex for chest congestion Continue on CPAP at bedtime. You appear to be benefiting from the treatment Goal is to wear for at least 4-6 hours each night for maximal clinical benefit. Continue to work on weight loss, as the link between excess weight  and sleep apnea is well established.  Do not drive if sleepy. Follow up in 4-6 weeks with either myself or Dr. Delton CoombesByrum.. Please contact office for sooner follow up if symptoms do not improve or worsen or seek emergency care       Bevelyn NgoSarah F Chele Cornell, NP 07/28/2015  3:45 PM    Attending Note:  I have examined patient,  reviewed labs, studies and notes. I have discussed the case with Saralyn PilarS Herschell Virani, and I agree with the data and plans as amended above.   [name] is improved after a flare of his bronchiectasis and possible healthcare associated pneumonia. He was treated with antibiotics, prednisone taper. I suspect that some of his decompensation was also related to the fact that he ran out of Symbicort. This has since been restarted. He is doing a chest vest and a flutter valve. We will continue his current oxygen and medication regimen as detailed above. Work on Corporate investment bankerfinancial assistance for his Symbicort if available.. Repeat a chest x-ray in one month. Continue CPAP as he is using it.  Levy Pupaobert Byrum, MD, PhD 07/28/2015, 3:57 PM [facility] [phone] or if no answer [phone]

## 2015-07-28 NOTE — Patient Instructions (Addendum)
It is nice to meet you today. Follow up CXR in 4 weeks on follow up.  We will call you with results. Continue your Doxycycline and Prednisone taper until gone. Continue using your Symbicort 2 puffs twice daily. Rinse mouth after use. Continue your Neb treatments with your Chest Vest three times daily. Continue your Mucinex for chest congestion Continue on CPAP at bedtime. You appear to be benefiting from the treatment Goal is to wear for at least 4-6 hours each night for maximal clinical benefit. Continue to work on weight loss, as the link between excess weight  and sleep apnea is well established.  Do not drive if sleepy. Follow up in 4 weeks with either myself or Dr. Delton CoombesByrum.. Please contact office for sooner follow up if symptoms do not improve or worsen or seek emergency care

## 2015-08-11 IMAGING — CR DG CHEST 2V
2 series · 2 of 2 positions shown · non-contrast
Comparison: Chest radiograph 05/09/2014

CLINICAL DATA: Patient with history of sarcoidosis. Go no current
chest complaints. Bronchiectasis.

EXAM:
CHEST  2 VIEW

[view not recorded (1 of 2)]
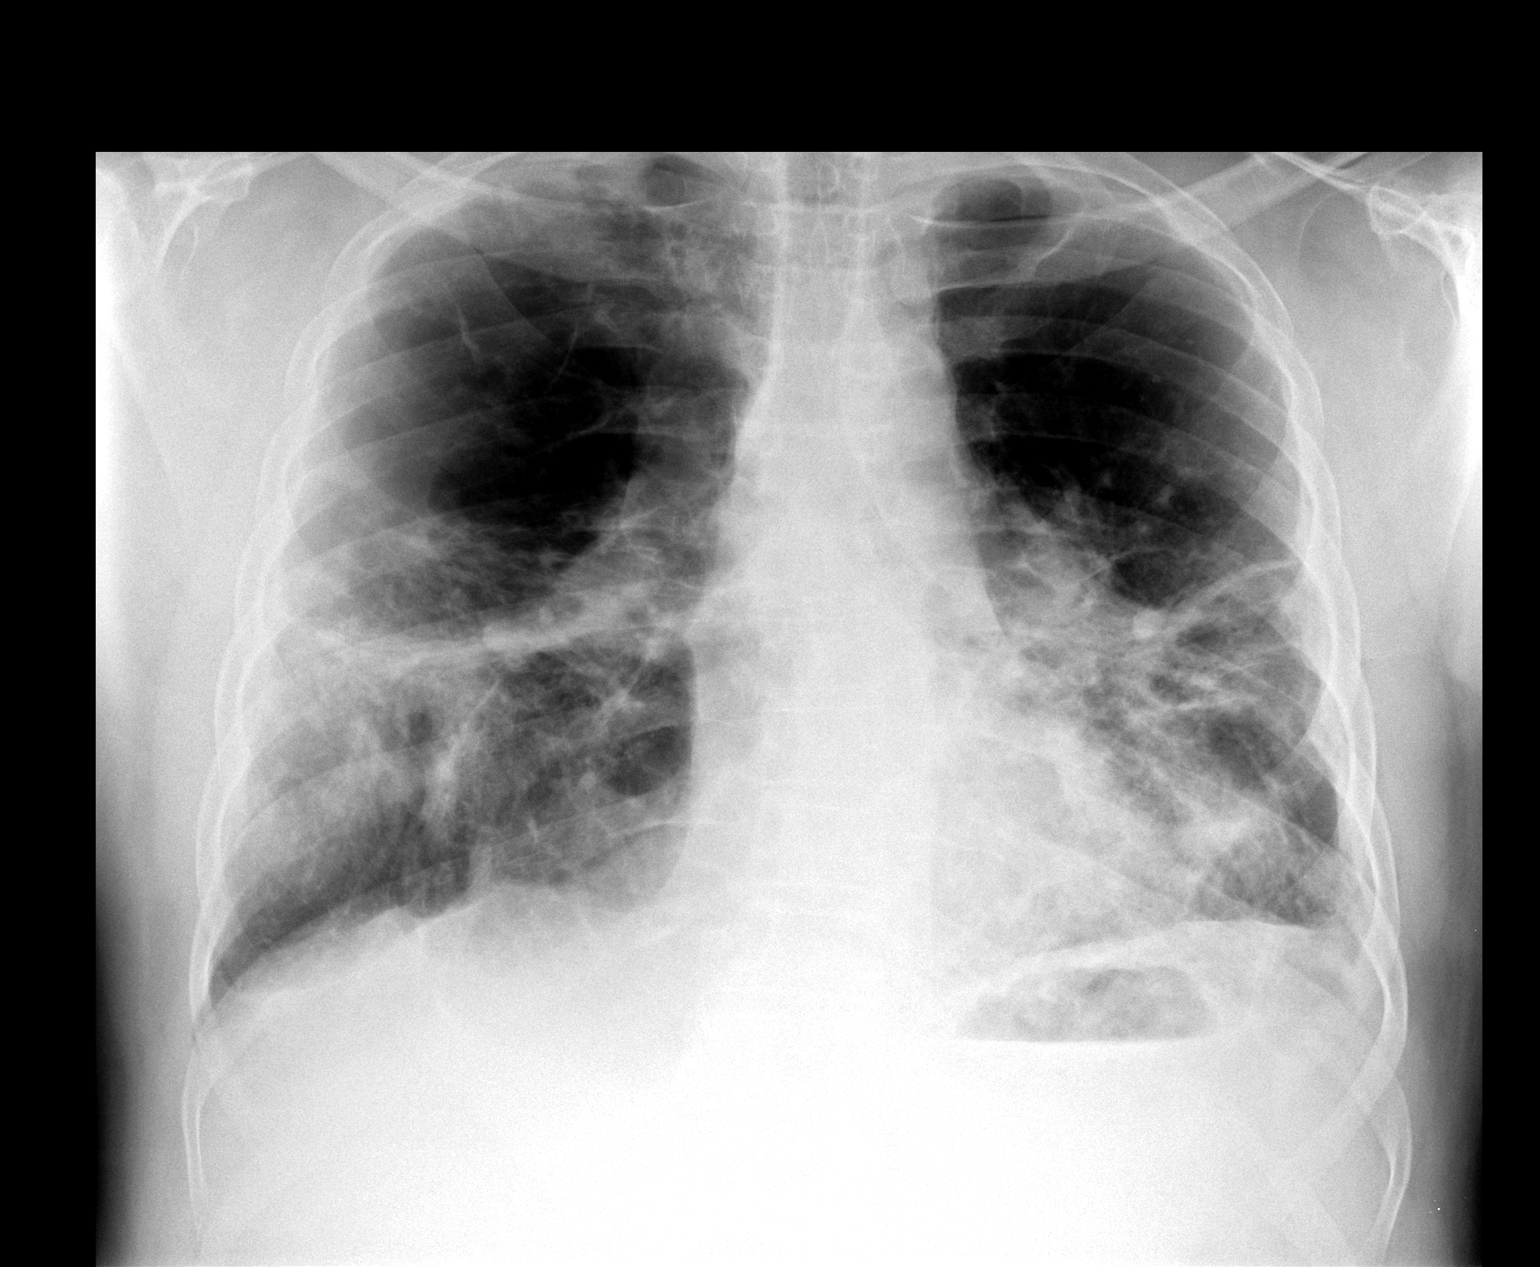

[view not recorded (2 of 2)]
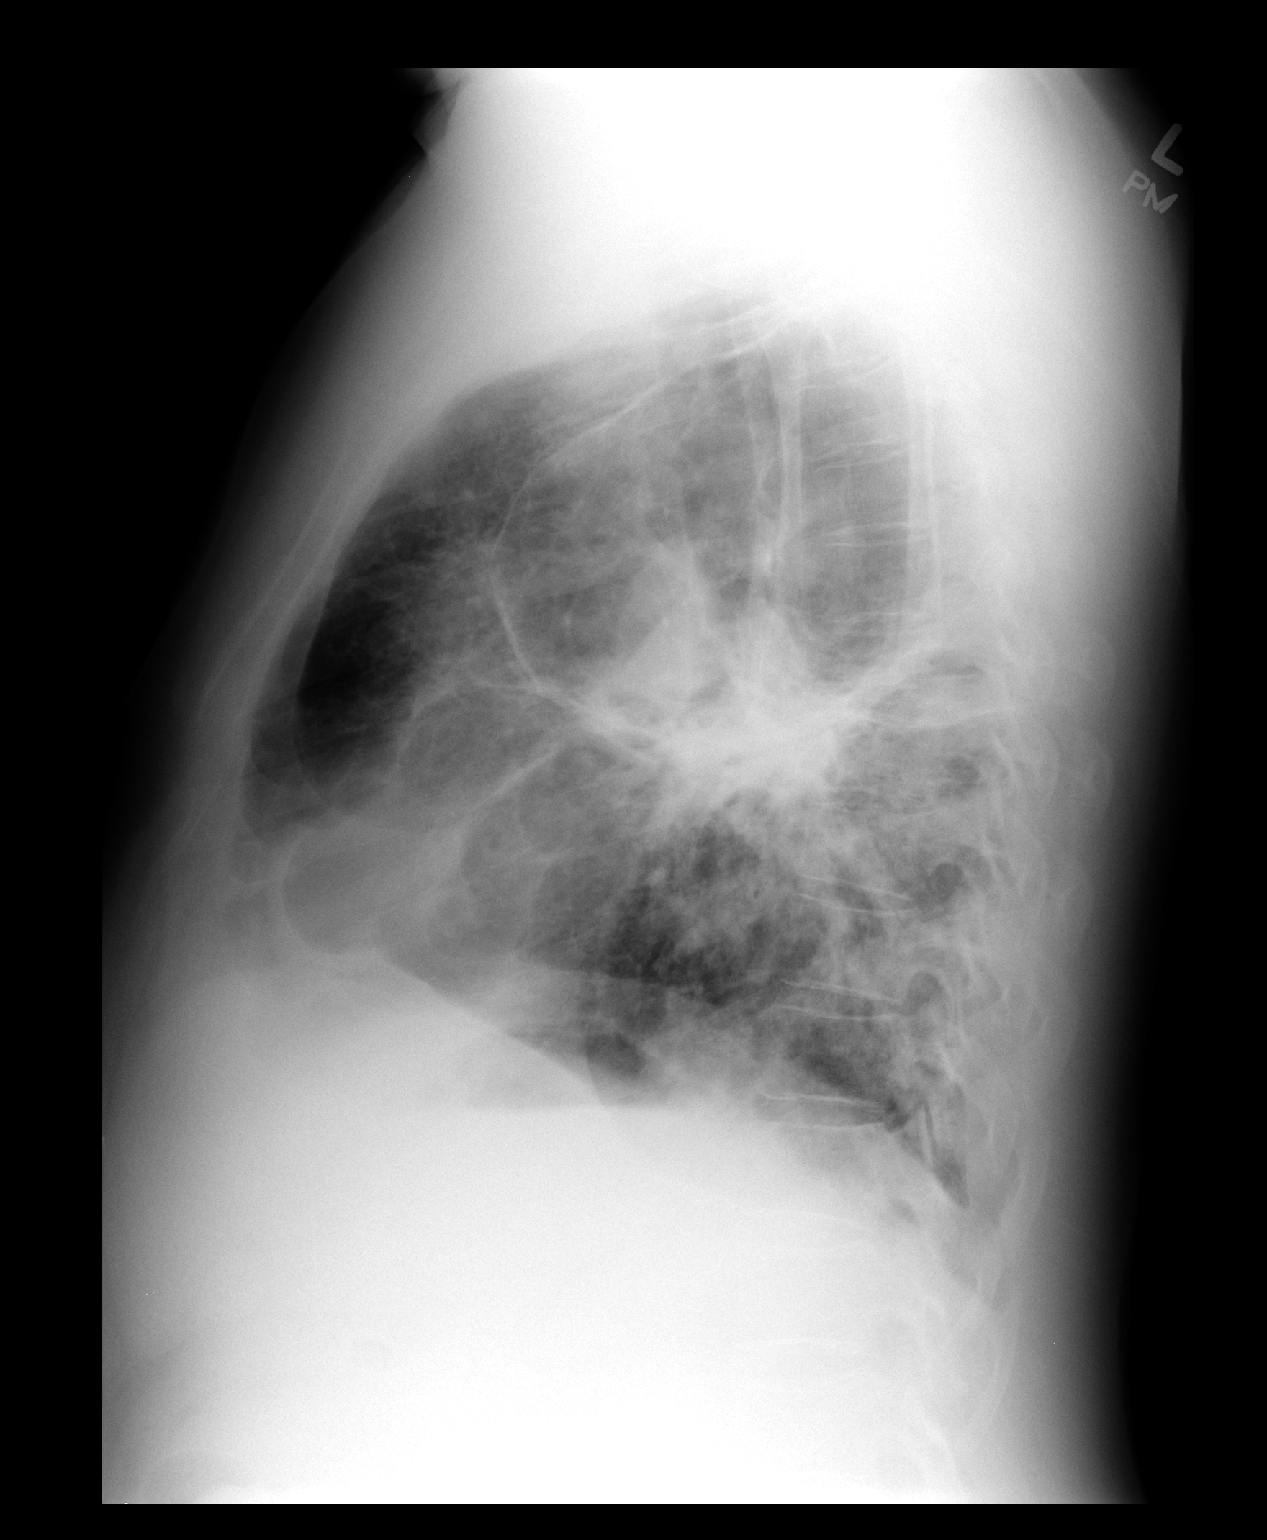

[2 of 2 positions shown; findings below may reference images not displayed]

FINDINGS: Stable cardiac and mediastinal contours. No significant interval
change in bilateral coarse interstitial pulmonary opacities
compatible pulmonary scarring. Upper lobe bullous changes. No
evidence for new superimposed consolidative pulmonary opacity. Mid
thoracic spine degenerative changes.
IMPRESSION: Grossly unchanged coarse bilateral interstitial pulmonary opacities
without definite evidence for acute superimposed process.

## 2015-08-19 ENCOUNTER — Telehealth: Payer: Self-pay | Admitting: Emergency Medicine

## 2015-08-19 NOTE — Telephone Encounter (Signed)
[phone], pt cb

## 2015-08-19 NOTE — Telephone Encounter (Signed)
LM x 1 

## 2015-08-19 NOTE — Telephone Encounter (Signed)
Spoke with pt, states his eye dr he has put him on  prednisone qd X2 weeks until he sees his eye dr again.  Pt wants to make RB aware of his high pred dose and long taper.    Forwarding to RB as FYI.

## 2015-08-20 NOTE — Telephone Encounter (Signed)
Ok thank you 

## 2015-09-02 ENCOUNTER — Ambulatory Visit: Payer: Commercial Managed Care - HMO | Admitting: Emergency Medicine

## 2015-09-04 ENCOUNTER — Ambulatory Visit: Payer: Commercial Managed Care - HMO | Admitting: Emergency Medicine

## 2015-09-06 LAB — ACID FAST CULTURE WITH REFLEXED SENSITIVITIES: ACID FAST CULTURE - AFSCU3: NEGATIVE

## 2015-09-08 ENCOUNTER — Ambulatory Visit: Payer: Commercial Managed Care - HMO | Admitting: Acute Care

## 2015-09-09 ENCOUNTER — Ambulatory Visit: Payer: Commercial Managed Care - HMO | Admitting: Adult Health

## 2015-09-18 ENCOUNTER — Encounter (HOSPITAL_COMMUNITY): Payer: Self-pay | Admitting: Emergency Medicine

## 2015-09-18 ENCOUNTER — Observation Stay (HOSPITAL_COMMUNITY)
Admission: EM | Admit: 2015-09-18 | Discharge: 2015-09-20 | Disposition: A | Discharge status: Home / Self Care | Payer: Commercial Managed Care - HMO | Attending: Internal Medicine | Admitting: Internal Medicine

## 2015-09-18 DIAGNOSIS — R040 Epistaxis: Secondary | ICD-10-CM | POA: Diagnosis not present

## 2015-09-18 DIAGNOSIS — Z9981 Dependence on supplemental oxygen: Secondary | ICD-10-CM | POA: Insufficient documentation

## 2015-09-18 DIAGNOSIS — I10 Essential (primary) hypertension: Secondary | ICD-10-CM | POA: Diagnosis present

## 2015-09-18 DIAGNOSIS — Z79899 Other long term (current) drug therapy: Secondary | ICD-10-CM | POA: Insufficient documentation

## 2015-09-18 DIAGNOSIS — J9611 Chronic respiratory failure with hypoxia: Secondary | ICD-10-CM | POA: Diagnosis not present

## 2015-09-18 DIAGNOSIS — Z7951 Long term (current) use of inhaled steroids: Secondary | ICD-10-CM | POA: Insufficient documentation

## 2015-09-18 DIAGNOSIS — J3489 Other specified disorders of nose and nasal sinuses: Secondary | ICD-10-CM | POA: Insufficient documentation

## 2015-09-18 DIAGNOSIS — E875 Hyperkalemia: Secondary | ICD-10-CM | POA: Diagnosis not present

## 2015-09-18 DIAGNOSIS — D869 Sarcoidosis, unspecified: Secondary | ICD-10-CM | POA: Diagnosis present

## 2015-09-18 DIAGNOSIS — J449 Chronic obstructive pulmonary disease, unspecified: Secondary | ICD-10-CM | POA: Insufficient documentation

## 2015-09-18 DIAGNOSIS — G4733 Obstructive sleep apnea (adult) (pediatric): Secondary | ICD-10-CM | POA: Diagnosis present

## 2015-09-18 DIAGNOSIS — Z9989 Dependence on other enabling machines and devices: Secondary | ICD-10-CM | POA: Insufficient documentation

## 2015-09-18 DIAGNOSIS — K219 Gastro-esophageal reflux disease without esophagitis: Secondary | ICD-10-CM | POA: Diagnosis not present

## 2015-09-18 LAB — BASIC METABOLIC PANEL
ANION GAP: 6 (ref 5–15)
BUN: 19 mg/dL (ref 6–20)
CALCIUM: 9.5 mg/dL (ref 8.9–10.3)
CHLORIDE: 105 mmol/L (ref 101–111)
CO2: 31 mmol/L (ref 22–32)
CREATININE: 1.18 mg/dL (ref 0.61–1.24)
GFR calc non Af Amer: >60 mL/min (ref >60)
Glucose, Bld: 131 mg/dL — ABNORMAL HIGH (ref 65–99)
Potassium: 5.5 mmol/L — ABNORMAL HIGH (ref 3.5–5.1)
SODIUM: 142 mmol/L (ref 135–145)

## 2015-09-18 LAB — CBC WITH DIFFERENTIAL/PLATELET
BASOS PCT: 0 %
Basophils Absolute: 0 10*3/uL (ref 0.0–0.1)
EOS ABS: 0 10*3/uL (ref 0.0–0.7)
Eosinophils Relative: 0 %
HEMATOCRIT: 37.2 % — AB (ref 39.0–52.0)
HEMOGLOBIN: 11.9 g/dL — AB (ref 13.0–17.0)
Lymphocytes Relative: 6 %
Lymphs Abs: 0.6 10*3/uL — ABNORMAL LOW (ref 0.7–4.0)
MCH: 27.7 pg (ref 26.0–34.0)
MCHC: 32 g/dL (ref 30.0–36.0)
MCV: 86.7 fL (ref 78.0–100.0)
Monocytes Absolute: 0.5 10*3/uL (ref 0.1–1.0)
Monocytes Relative: 5 %
NEUTROS ABS: 8.8 10*3/uL — AB (ref 1.7–7.7)
NEUTROS PCT: 89 %
Platelets: 334 10*3/uL (ref 150–400)
RBC: 4.29 MIL/uL (ref 4.22–5.81)
RDW: 13.9 % (ref 11.5–15.5)
WBC: 9.9 10*3/uL (ref 4.0–10.5)

## 2015-09-18 MED ORDER — ALBUTEROL SULFATE (2.5 MG/3ML) 0.083% IN NEBU
2.5000 mg | INHALATION_SOLUTION | Freq: Once | RESPIRATORY_TRACT | Status: AC
  Administered 2015-09-18: 2.5 mg via RESPIRATORY_TRACT

## 2015-09-18 MED ORDER — SODIUM CHLORIDE 0.9 % IV BOLUS (SEPSIS)
1000.0000 mL | Freq: Once | INTRAVENOUS | Status: AC
  Administered 2015-09-18: 1000 mL via INTRAVENOUS

## 2015-09-18 MED ORDER — IPRATROPIUM-ALBUTEROL 0.5-2.5 (3) MG/3ML IN SOLN
3.0000 mL | Freq: Once | RESPIRATORY_TRACT | Status: AC
  Administered 2015-09-18: 3 mL via RESPIRATORY_TRACT
  Filled 2015-09-18: qty 3

## 2015-09-18 MED ORDER — OXYMETAZOLINE HCL 0.05 % NA SOLN
1.0000 | Freq: Once | NASAL | Status: AC
  Administered 2015-09-18: 1 via NASAL
  Filled 2015-09-18: qty 15

## 2015-09-18 MED ORDER — IPRATROPIUM-ALBUTEROL 0.5-2.5 (3) MG/3ML IN SOLN
3.0000 mL | Freq: Once | RESPIRATORY_TRACT | Status: AC
  Administered 2015-09-18: 3 mL via RESPIRATORY_TRACT

## 2015-09-18 NOTE — Progress Notes (Signed)
Pt placed on 40% Venti mask due to Pt having packing in nose because of nose bleed.  Pt currently tolerating well at this time.  RT to monitor and assess as needed.

## 2015-09-18 NOTE — ED Triage Notes (Addendum)
Pt from home via EMS with c/o nosebleed that began around 2030. Pt does not take blood thinners. Pt nomaly is on 3 L o2 [place]. Pt's nose is actively bleeding. Pt is on nonrebreather mask because he can not currently use [place]. Pt denies pain. Pt had afrin in both nares in route. [name] notified of pt arrival

## 2015-09-18 NOTE — ED Notes (Addendum)
Pt's friend can be contacted at [phone] [facility] for [facility]   [name] packed pt's nose in both nares. Bleeding is controlled at this time.

## 2015-09-19 ENCOUNTER — Encounter (HOSPITAL_COMMUNITY): Payer: Self-pay | Admitting: Internal Medicine

## 2015-09-19 DIAGNOSIS — R040 Epistaxis: Secondary | ICD-10-CM | POA: Diagnosis not present

## 2015-09-19 DIAGNOSIS — E875 Hyperkalemia: Secondary | ICD-10-CM

## 2015-09-19 DIAGNOSIS — I10 Essential (primary) hypertension: Secondary | ICD-10-CM | POA: Diagnosis not present

## 2015-09-19 LAB — CBC WITH DIFFERENTIAL/PLATELET
BASOS ABS: 0 10*3/uL (ref 0.0–0.1)
BASOS PCT: 0 %
EOS ABS: 0.1 10*3/uL (ref 0.0–0.7)
EOS PCT: 1 %
HCT: 33.2 % — ABNORMAL LOW (ref 39.0–52.0)
Hemoglobin: 10.8 g/dL — ABNORMAL LOW (ref 13.0–17.0)
Lymphocytes Relative: 10 %
Lymphs Abs: 0.8 10*3/uL (ref 0.7–4.0)
MCH: 28.2 pg (ref 26.0–34.0)
MCHC: 32.5 g/dL (ref 30.0–36.0)
MCV: 86.7 fL (ref 78.0–100.0)
Monocytes Absolute: 0.7 10*3/uL (ref 0.1–1.0)
Monocytes Relative: 8 %
NEUTROS PCT: 81 %
Neutro Abs: 6.6 10*3/uL (ref 1.7–7.7)
PLATELETS: 257 10*3/uL (ref 150–400)
RBC: 3.83 MIL/uL — AB (ref 4.22–5.81)
RDW: 13.9 % (ref 11.5–15.5)
WBC: 8.2 10*3/uL (ref 4.0–10.5)

## 2015-09-19 LAB — CBC
HEMATOCRIT: 32.9 % — AB (ref 39.0–52.0)
HEMATOCRIT: 33.7 % — AB (ref 39.0–52.0)
HEMOGLOBIN: 10.7 g/dL — AB (ref 13.0–17.0)
HEMOGLOBIN: 10.9 g/dL — AB (ref 13.0–17.0)
MCH: 27.5 pg (ref 26.0–34.0)
MCH: 27.5 pg (ref 26.0–34.0)
MCHC: 32.5 g/dL (ref 30.0–36.0)
MCHC: 32.3 g/dL (ref 30.0–36.0)
MCV: 84.6 fL (ref 78.0–100.0)
MCV: 85.1 fL (ref 78.0–100.0)
Platelets: 244 10*3/uL (ref 150–400)
Platelets: 266 10*3/uL (ref 150–400)
RBC: 3.89 MIL/uL — AB (ref 4.22–5.81)
RBC: 3.96 MIL/uL — AB (ref 4.22–5.81)
RDW: 13.8 % (ref 11.5–15.5)
RDW: 13.7 % (ref 11.5–15.5)
WBC: 9.1 10*3/uL (ref 4.0–10.5)
WBC: 10.3 10*3/uL (ref 4.0–10.5)

## 2015-09-19 LAB — BASIC METABOLIC PANEL
ANION GAP: 6 (ref 5–15)
BUN: 21 mg/dL — AB (ref 6–20)
CO2: 30 mmol/L (ref 22–32)
Calcium: 8.9 mg/dL (ref 8.9–10.3)
Chloride: 104 mmol/L (ref 101–111)
Creatinine, Ser: 0.87 mg/dL (ref 0.61–1.24)
Glucose, Bld: 113 mg/dL — ABNORMAL HIGH (ref 65–99)
POTASSIUM: 4 mmol/L (ref 3.5–5.1)
SODIUM: 140 mmol/L (ref 135–145)

## 2015-09-19 LAB — I-STAT CHEM 8, ED
BUN: 28 mg/dL — AB (ref 6–20)
CREATININE: 1 mg/dL (ref 0.61–1.24)
Calcium, Ion: 1.14 mmol/L — ABNORMAL LOW (ref 1.15–1.40)
Chloride: 102 mmol/L (ref 101–111)
GLUCOSE: 127 mg/dL — AB (ref 65–99)
HCT: 35 % — ABNORMAL LOW (ref 39.0–52.0)
Hemoglobin: 11.9 g/dL — ABNORMAL LOW (ref 13.0–17.0)
POTASSIUM: 6.5 mmol/L — AB (ref 3.5–5.1)
Sodium: 138 mmol/L (ref 135–145)
TCO2: 34 mmol/L (ref 0–100)

## 2015-09-19 LAB — TYPE AND SCREEN
ABO/RH(D): O POS
ANTIBODY SCREEN: NEGATIVE

## 2015-09-19 LAB — ABO/RH: ABO/RH(D): O POS

## 2015-09-19 LAB — PROTIME-INR
INR: 0.97
PROTHROMBIN TIME: 12.9 s (ref 11.4–15.2)

## 2015-09-19 MED ORDER — HYDRALAZINE HCL 20 MG/ML IJ SOLN: 10.0000 mg | INTRAMUSCULAR | Status: DC | PRN

## 2015-09-19 MED ORDER — LIDOCAINE HCL 4 % EX SOLN
0.0000 mL | Freq: Once | CUTANEOUS | Status: DC | PRN
  Filled 2015-09-19: qty 50

## 2015-09-19 MED ORDER — LIDOCAINE-EPINEPHRINE (PF) 1 %-1:200000 IJ SOLN
0.0000 mL | Freq: Once | INTRAMUSCULAR | Status: DC | PRN
  Filled 2015-09-19: qty 30

## 2015-09-19 MED ORDER — SILVER NITRATE-POT NITRATE 75-25 % EX MISC
1.0000 | Freq: Once | CUTANEOUS | Status: DC | PRN
  Filled 2015-09-19: qty 1

## 2015-09-19 MED ORDER — ALBUTEROL SULFATE (2.5 MG/3ML) 0.083% IN NEBU
2.5000 mg | INHALATION_SOLUTION | Freq: Three times a day (TID) | RESPIRATORY_TRACT | Status: DC
  Administered 2015-09-19 – 2015-09-20 (×3): 2.5 mg via RESPIRATORY_TRACT
  Filled 2015-09-19 (×3): qty 3

## 2015-09-19 MED ORDER — ONDANSETRON HCL 4 MG/2ML IJ SOLN
4.0000 mg | Freq: Four times a day (QID) | INTRAMUSCULAR | Status: DC | PRN
  Administered 2015-09-19: 4 mg via INTRAVENOUS
  Filled 2015-09-19: qty 2

## 2015-09-19 MED ORDER — ACETAMINOPHEN 650 MG RE SUPP: 650.0000 mg | Freq: Four times a day (QID) | RECTAL | Status: DC | PRN

## 2015-09-19 MED ORDER — AMOXICILLIN-POT CLAVULANATE 875-125 MG PO TABS
1.0000 | ORAL_TABLET | Freq: Two times a day (BID) | ORAL | Status: DC
  Administered 2015-09-19: 1 via ORAL
  Filled 2015-09-19: qty 1

## 2015-09-19 MED ORDER — ALBUTEROL SULFATE (2.5 MG/3ML) 0.083% IN NEBU
2.5000 mg | INHALATION_SOLUTION | Freq: Four times a day (QID) | RESPIRATORY_TRACT | Status: DC
  Administered 2015-09-19: 2.5 mg via RESPIRATORY_TRACT
  Filled 2015-09-19: qty 3

## 2015-09-19 MED ORDER — SODIUM CHLORIDE 0.9 % IV SOLN
INTRAVENOUS | Status: AC
  Administered 2015-09-19 (×2): via INTRAVENOUS

## 2015-09-19 MED ORDER — SODIUM CHLORIDE 0.9 % IV SOLN
3.0000 g | Freq: Four times a day (QID) | INTRAVENOUS | Status: DC
  Administered 2015-09-19 – 2015-09-20 (×5): 3 g via INTRAVENOUS
  Filled 2015-09-19 (×6): qty 3

## 2015-09-19 MED ORDER — METHYLPREDNISOLONE SODIUM SUCC 40 MG IJ SOLR
20.0000 mg | Freq: Every day | INTRAMUSCULAR | Status: DC
  Administered 2015-09-19 – 2015-09-20 (×2): 20 mg via INTRAVENOUS
  Filled 2015-09-19 (×2): qty 1

## 2015-09-19 MED ORDER — METOPROLOL TARTRATE 5 MG/5ML IV SOLN
2.5000 mg | Freq: Four times a day (QID) | INTRAVENOUS | Status: DC
  Administered 2015-09-19 – 2015-09-20 (×5): 2.5 mg via INTRAVENOUS
  Filled 2015-09-19 (×5): qty 5

## 2015-09-19 MED ORDER — TRIPLE ANTIBIOTIC 3.5-400-5000 EX OINT
1.0000 "application " | TOPICAL_OINTMENT | Freq: Once | CUTANEOUS | Status: DC | PRN
  Filled 2015-09-19: qty 1

## 2015-09-19 MED ORDER — BUDESONIDE-FORMOTEROL FUMARATE 160-4.5 MCG/ACT IN AERO
2.0000 | INHALATION_SPRAY | Freq: Two times a day (BID) | RESPIRATORY_TRACT | Status: DC
  Administered 2015-09-19 – 2015-09-20 (×3): 2 via RESPIRATORY_TRACT
  Filled 2015-09-19: qty 6

## 2015-09-19 MED ORDER — OXYMETAZOLINE HCL 0.05 % NA SOLN
1.0000 | Freq: Once | NASAL | Status: DC | PRN
  Filled 2015-09-19: qty 15

## 2015-09-19 MED ORDER — ACETAMINOPHEN 325 MG PO TABS: 650.0000 mg | ORAL_TABLET | Freq: Four times a day (QID) | ORAL | Status: DC | PRN

## 2015-09-19 MED ORDER — ONDANSETRON HCL 4 MG PO TABS
4.0000 mg | ORAL_TABLET | Freq: Four times a day (QID) | ORAL | Status: DC | PRN
Start: 2015-09-19 — End: 2015-09-20

## 2015-09-19 MED ORDER — ALBUTEROL SULFATE (2.5 MG/3ML) 0.083% IN NEBU: 2.5000 mg | INHALATION_SOLUTION | RESPIRATORY_TRACT | Status: DC | PRN

## 2015-09-19 MED ORDER — SALINE SPRAY 0.65 % NA SOLN
2.0000 | NASAL | Status: DC | PRN
  Filled 2015-09-19: qty 44

## 2015-09-19 NOTE — Progress Notes (Addendum)
[facility]  I have examined the patient and reviewed the H and P.   Subjective: patient feels that he is still bleeding- blood is trickling in the back of his throat and dribbling from around the nasal packing.   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   Active Problems:   Sarcoid    Chronic respiratory failure with hypoxia  - chronically on 3-4 L O2,     OSA on CPAP    Essential hypertension - holding antihypertensives  Calvert CantorSaima Anthony Roland, MD

## 2015-09-19 NOTE — Progress Notes (Signed)
Pharmacy Antibiotic Note  [name] is a 50 y.o. male admitted on 09/18/2015 with aspiration pna.  Pharmacy has been consulted for Unasyn dosing.  Plan: Unasyn 3 Gm IV q6h  Height: 5\' 7"  (170.2 cm) Weight: 170 lb 12.8 oz (77.5 kg) IBW/kg (Calculated) : 66.1  Temp (24hrs), Avg:98.5 F (36.9 C), Min:98.5 F (36.9 C), Max:98.5 F (36.9 C)   Recent Labs Lab 09/18/15 2250 09/19/15 0120  WBC 9.9  --   CREATININE 1.18 1.00    Estimated Creatinine Clearance: 82.6 mL/min (by C-G formula based on SCr of 1 mg/dL).    Allergies  Allergen Reactions  . Clindamycin/Lincomycin Anaphylaxis and Swelling    Made face and tongue swell    Antimicrobials this admission: 9/8 Augmentin >> x1 9/8 Unasyn >>   Dose adjustments this admission:   Microbiology results:  BCx:   UCx:    Sputum:    MRSA PCR:   Thank you for allowing pharmacy to be a part of this patient's care.  Lorenza EvangelistGreen, [name] 09/19/2015 3:53 AM

## 2015-09-19 NOTE — Consult Note (Signed)
Reason for Consult: Epistaxis Referring Physician: Debbe Odea, MD  [name] is an 50 y.o. male.  HPI: Has had minor epistaxis in the past. Started a severe nosebleed from the right side yesterday afternoon and it had progressed until admission to the hospital early this morning. It started on the right side but eventually was coming from both sides. It started anteriorly. He has a history of sarcoidosis and a known history of septal perforation.  Past Medical History:  Diagnosis Date  . Bronchitis   . Chronic respiratory failure ([place])    home O@ 4L (01/28/14)  . COPD (chronic obstructive pulmonary disease) ([place])   . Hypertension   . Neuromuscular disorder ([place])   . OSA on CPAP   . Pneumonia   . Sarcoidosis [facility])     Past Surgical History:  Procedure Laterality Date  . arm surgery     . ROTATOR CUFF REPAIR    . VIDEO ASSISTED THORACOSCOPY (VATS)/THOROCOTOMY Left 07/17/2012   Procedure: VIDEO ASSISTED THORACOSCOPY (VATS)/BLEB Stapling;  Surgeon: Gaye Pollack, MD;  Location: [facility] OR;  Service: Thoracic;  Laterality: Left;    Family History  Problem Relation Age of Onset  . Diabetes Father     Social History:  reports that he has never smoked. He has never used smokeless tobacco. He reports that he drinks alcohol. He reports that he does not use drugs.  Allergies:  Allergies  Allergen Reactions  . Clindamycin/Lincomycin Anaphylaxis and Swelling    Made face and tongue swell    Medications: Reviewed  Results for orders placed or performed during the hospital encounter of 09/18/15 (from the past 48 hour(s))  Basic metabolic panel     Status: Abnormal   Collection Time: 09/18/15 10:50 PM  Result Value Ref Range   Sodium 142 135 - 145 mmol/L   Potassium 5.5 (H) 3.5 - 5.1 mmol/L   Chloride 105 101 - 111 mmol/L   CO2 31 22 - 32 mmol/L   Glucose, Bld 131 (H) 65 - 99 mg/dL   BUN 19 6 - 20 mg/dL   Creatinine, Ser 1.18 0.61 - 1.24 mg/dL   Calcium 9.5 8.9 - 10.3 mg/dL    GFR calc non Af Amer >60 >60 mL/min   GFR calc Af Amer >60 >60 mL/min    Comment: (NOTE) The eGFR has been calculated using the CKD EPI equation. This calculation has not been validated in all clinical situations. eGFR's persistently <60 mL/min signify possible Chronic Kidney Disease.    Anion gap 6 5 - 15  CBC with Differential     Status: Abnormal   Collection Time: 09/18/15 10:50 PM  Result Value Ref Range   WBC 9.9 4.0 - 10.5 K/uL   RBC 4.29 4.22 - 5.81 MIL/uL   Hemoglobin 11.9 (L) 13.0 - 17.0 g/dL   HCT 37.2 (L) 39.0 - 52.0 %   MCV 86.7 78.0 - 100.0 fL   MCH 27.7 26.0 - 34.0 pg   MCHC 32.0 30.0 - 36.0 g/dL   RDW 13.9 11.5 - 15.5 %   Platelets 334 150 - 400 K/uL   Neutrophils Relative % 89 %   Neutro Abs 8.8 (H) 1.7 - 7.7 K/uL   Lymphocytes Relative 6 %   Lymphs Abs 0.6 (L) 0.7 - 4.0 K/uL   Monocytes Relative 5 %   Monocytes Absolute 0.5 0.1 - 1.0 K/uL   Eosinophils Relative 0 %   Eosinophils Absolute 0.0 0.0 - 0.7 K/uL   Basophils Relative 0 %  Basophils Absolute 0.0 0.0 - 0.1 K/uL  Type and screen Oto     Status: None   Collection Time: 09/19/15  1:10 AM  Result Value Ref Range   ABO/RH(D) O POS    Antibody Screen NEG    Sample Expiration 09/22/2015   ABO/Rh     Status: None   Collection Time: 09/19/15  1:10 AM  Result Value Ref Range   ABO/RH(D) O POS   I-stat Chem 8, ED     Status: Abnormal   Collection Time: 09/19/15  1:20 AM  Result Value Ref Range   Sodium 138 135 - 145 mmol/L   Potassium 6.5 (HH) 3.5 - 5.1 mmol/L   Chloride 102 101 - 111 mmol/L   BUN 28 (H) 6 - 20 mg/dL   Creatinine, Ser 1.00 0.61 - 1.24 mg/dL   Glucose, Bld 127 (H) 65 - 99 mg/dL   Calcium, Ion 1.14 (L) 1.15 - 1.40 mmol/L   TCO2 34 0 - 100 mmol/L   Hemoglobin 11.9 (L) 13.0 - 17.0 g/dL   HCT 35.0 (L) 39.0 - 52.0 %   Comment NOTIFIED PHYSICIAN   CBC with Differential/Platelet     Status: Abnormal   Collection Time: 09/19/15  4:13 AM  Result Value Ref Range    WBC 8.2 4.0 - 10.5 K/uL   RBC 3.83 (L) 4.22 - 5.81 MIL/uL   Hemoglobin 10.8 (L) 13.0 - 17.0 g/dL   HCT 33.2 (L) 39.0 - 52.0 %   MCV 86.7 78.0 - 100.0 fL   MCH 28.2 26.0 - 34.0 pg   MCHC 32.5 30.0 - 36.0 g/dL   RDW 13.9 11.5 - 15.5 %   Platelets 257 150 - 400 K/uL   Neutrophils Relative % 81 %   Neutro Abs 6.6 1.7 - 7.7 K/uL   Lymphocytes Relative 10 %   Lymphs Abs 0.8 0.7 - 4.0 K/uL   Monocytes Relative 8 %   Monocytes Absolute 0.7 0.1 - 1.0 K/uL   Eosinophils Relative 1 %   Eosinophils Absolute 0.1 0.0 - 0.7 K/uL   Basophils Relative 0 %   Basophils Absolute 0.0 0.0 - 0.1 K/uL  Basic metabolic panel     Status: Abnormal   Collection Time: 09/19/15  4:13 AM  Result Value Ref Range   Sodium 140 135 - 145 mmol/L   Potassium 4.0 3.5 - 5.1 mmol/L    Comment: DELTA CHECK NOTED PREVIOUS RESULT FROM ISTAT    Chloride 104 101 - 111 mmol/L   CO2 30 22 - 32 mmol/L   Glucose, Bld 113 (H) 65 - 99 mg/dL   BUN 21 (H) 6 - 20 mg/dL   Creatinine, Ser 0.87 0.61 - 1.24 mg/dL   Calcium 8.9 8.9 - 10.3 mg/dL   GFR calc non Af Amer >60 >60 mL/min   GFR calc Af Amer >60 >60 mL/min    Comment: (NOTE) The eGFR has been calculated using the CKD EPI equation. This calculation has not been validated in all clinical situations. eGFR's persistently <60 mL/min signify possible Chronic Kidney Disease.    Anion gap 6 5 - 15  CBC     Status: Abnormal   Collection Time: 09/19/15  7:39 AM  Result Value Ref Range   WBC 9.1 4.0 - 10.5 K/uL   RBC 3.89 (L) 4.22 - 5.81 MIL/uL   Hemoglobin 10.7 (L) 13.0 - 17.0 g/dL   HCT 32.9 (L) 39.0 - 52.0 %   MCV 84.6 78.0 -  100.0 fL   MCH 27.5 26.0 - 34.0 pg   MCHC 32.5 30.0 - 36.0 g/dL   RDW 13.8 11.5 - 15.5 %   Platelets 244 150 - 400 K/uL  Protime-INR     Status: None   Collection Time: 09/19/15  7:39 AM  Result Value Ref Range   Prothrombin Time 12.9 11.4 - 15.2 seconds   INR 0.97     No results found.  TAV:[id number] except as listed in admit  H&P  Blood pressure (!) 149/93, pulse (!) 110, temperature 98.5 F (36.9 C), temperature source Oral, resp. rate 20, height _0  (1.702 m), weight 77.5 kg (170 lb 12.8 oz), SpO2 95 %.  PHYSICAL EXAM: Overall appearance:  Healthy appearing, in no distress Head:  Normocephalic, atraumatic. Ears: External ears look normal. Nose: External nose is healthy in appearance. Bilateral inflatable packing in place, removed. Large amount of blood clot removed from both nasal cavities. Large septal perforation area and some granulation tissue along the posterior aspect of the perforation. Oral Cavity/Pharynx:  There are no mucosal lesions or masses identified. Larynx/Hypopharynx: Deferred Neuro:  No identifiable neurologic deficits. Neck: No palpable neck masses.  Studies Reviewed: none  Procedures: Treatment of epistaxis.  The nasal cavities were evacuated of all blood clot. Topical Afrin was used for decongestant. Topical Afrin/Xylocaine was also used for topical anesthesia. Careful survey of the nasal mucosa revealed no obvious bleeding source. No further treatment was performed.   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 Recommend frequent use of nasal saline spray to prevent dryness of the mucosa. Recommend he keep his fingers out of his nose. I will keep all of my equipment in the room in case there is further bleeding. Call me as needed. He may resume a diet.  Kedar Sedano 09/19/2015, 9:41 AM

## 2015-09-19 NOTE — H&P (Signed)
History and Physical    [name] ZOX:[id number] DOB: 31-Mar-1965 DOA: 09/18/2015  PCP: Clydie Braun, MD  Patient coming from: Home.  Chief Complaint: Nasal bleed.  HPI: [name] is a 50 y.o. 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 Patient states he had picked his nose and following which started developing epistaxis. As per the ER physician patient had severe epistaxis and had to have nasal packing done in both the nostrils. Presently bleeding is controlled with packing. ER physician had discussed with Dr. Jenne Pane on-call ENT surgeon who advised antibiotics and follow-up with Dr. Jenne Pane as outpatient if there is no further bleeding. Since patient had significant bleed patient is being admitted for further observation.   ED Course: See history of presenting illness.  Review of Systems: As per HPI, rest all negative.   Past Medical History:  Diagnosis Date  . Bronchitis   . Chronic respiratory failure ([facility])    home O@ 4L (01/28/14)  . COPD (chronic obstructive pulmonary disease) ([facility])   . Hypertension   . Neuromuscular disorder ([facility])   . OSA on CPAP   . Pneumonia   . Sarcoidosis [facility])     Past Surgical History:  Procedure Laterality Date  . arm surgery     . ROTATOR CUFF REPAIR    . VIDEO ASSISTED THORACOSCOPY (VATS)/THOROCOTOMY Left 07/17/2012   Procedure: VIDEO ASSISTED THORACOSCOPY (VATS)/BLEB Stapling;  Surgeon: Alleen Borne, MD;  Location: [facility] OR;  Service: Thoracic;  Laterality: Left;     reports that he has never smoked. He has never used smokeless tobacco. He reports that he drinks alcohol. He reports that he does not use drugs.  Allergies  Allergen Reactions  . Clindamycin/Lincomycin Anaphylaxis and Swelling    Made face and tongue swell    Family History  Problem Relation Age of  Onset  . Diabetes Father     Prior to Admission medications   Medication Sig Start Date End Date Taking? Authorizing Provider  albuterol (PROVENTIL) (2.5 MG/3ML) 0.083% nebulizer solution Take 2.5 mg by nebulization every 4 (four) hours as needed for wheezing or shortness of breath (wheezing and shortness of breath).  06/08/12  Yes Historical Provider, MD  bisacodyl (BISACODYL) 5 MG EC tablet Take 1 tablet (5 mg total) by mouth daily as needed for moderate constipation. 01/16/14  Yes Simonne Martinet, NP  bisoprolol (ZEBETA) 10 MG tablet Take 1 tablet (10 mg total) by mouth daily. 03/26/14  Yes Maryann Mikhail, DO  budesonide-formoterol (SYMBICORT) 160-4.5 MCG/ACT inhaler Inhale 2 puffs into the lungs 2 (two) times daily.   Yes Historical Provider, MD  calcium-vitamin D (OSCAL WITH D) 500-200 MG-UNIT per tablet Take 1 tablet by mouth every morning.   Yes Historical Provider, MD  cyclobenzaprine (FLEXERIL) 10 MG tablet Take 10 mg by mouth 3 (three) times daily as needed for muscle spasms (muscle spasms).    Yes Historical Provider, MD  diltiazem (CARDIZEM CD) 120 MG 24 hr capsule Take 1 capsule (120 mg total) by mouth daily. 01/28/14  Yes Jeanella Craze, NP  diphenhydrAMINE (BENADRYL) 25 MG tablet Take 25 mg by mouth every 6 (six) hours as needed for allergies (allergies).    Yes Historical Provider, MD  Ergocalciferol (VITAMIN D2) 400 units TABS Take 400 Units by mouth daily. 03/18/08  Yes Historical Provider, MD  ferrous sulfate 325 (  65 FE) MG tablet Take 1 tablet (325 mg total) by mouth 2 (two) times daily with a meal. 06/18/12  Yes Leroy SeaPrashant K Singh, MD  fluticasone (FLONASE) 50 MCG/ACT nasal spray Place 2 sprays into both nostrils 2 (two) times daily. Patient taking differently: Place 2 sprays into both nostrils 2 (two) times daily as needed for allergies.  01/28/14  Yes Jeanella CrazeBrandi L Ollis, NP  guaiFENesin (MUCINEX) 600 MG 12 hr tablet Take 1 tablet (600 mg total) by mouth 2 (two) times daily. 07/26/15  Yes  Costin Otelia SergeantM Gherghe, MD  magnesium citrate SOLN Take 1 Bottle by mouth daily as needed for mild constipation or severe constipation.   Yes Historical Provider, MD  Multiple Vitamins-Minerals (MULTIVITAMIN WITH MINERALS) tablet Take 1 tablet by mouth every morning.    Yes Historical Provider, MD  omeprazole (PRILOSEC) 40 MG capsule Take 40 mg by mouth daily. 11/04/14  Yes Historical Provider, MD  ondansetron (ZOFRAN) 4 MG tablet Take 4 mg by mouth every 8 (eight) hours as needed for nausea or vomiting.  09/05/14  Yes Historical Provider, MD  polyethylene glycol (MIRALAX / GLYCOLAX) packet Take 17 g by mouth daily. Patient taking differently: Take 17 g by mouth daily as needed for mild constipation or moderate constipation.  01/16/14  Yes Simonne MartinetPeter E Babcock, NP  predniSONE (DELTASONE) 20 MG tablet Take 3 tablets (60 mg total) by mouth daily with breakfast. 60 mg for 2 days then 50 mg for 3 days then 40 mg for 3 days then 20 mg for 3 days then 10 mg for 3 days 07/26/15  Yes Costin Otelia SergeantM Gherghe, MD  VENTOLIN HFA 108 (90 Base) MCG/ACT inhaler INHALE TWO PUFFS BY MOUTH EVERY 6 HOURS AS NEEDED FOR WHEEZING OR SHORTNESS OF BREATH 06/30/15  Yes Leslye Peerobert S Byrum, MD  doxycycline (VIBRAMYCIN) 100 MG capsule Take 1 capsule (100 mg total) by mouth 2 (two) times daily. One po bid x 7 days Patient not taking: Reported on 09/18/2015 07/26/15   Leatha Gildingostin M Gherghe, MD    Physical Exam: Vitals:   09/19/15 0040 09/19/15 0100 09/19/15 0300 09/19/15 0310  BP: 129/100   (!) 149/93  Pulse: 100 106  (!) 110  Resp: 15 26  (!) 22  Temp:    98.5 F (36.9 C)  TempSrc:    Oral  SpO2: 95% 100%  100%  Weight:   170 lb 12.8 oz (77.5 kg)   Height:   5\' 7"  (1.702 m)       Constitutional: Not in distress. Vitals:   09/19/15 0040 09/19/15 0100 09/19/15 0300 09/19/15 0310  BP: 129/100   (!) 149/93  Pulse: 100 106  (!) 110  Resp: 15 26  (!) 22  Temp:    98.5 F (36.9 C)  TempSrc:    Oral  SpO2: 95% 100%  100%  Weight:   170 lb 12.8 oz  (77.5 kg)   Height:   5\' 7"  (1.702 m)    Eyes: Anicteric no pallor. ENMT: Bilateral nasal packing done. Neck: No neck rigidity. Respiratory: No rhonchi or crepitations. Cardiovascular: S1 and S2 heard. Abdomen: Soft nontender bowel sounds present. No guarding or rigidity. Musculoskeletal: No edema. Skin: No rash. Neurologic: Alert awake oriented to time place and person. Moves all extremities. Psychiatric: Appears normal.   Labs on Admission: I have personally reviewed following labs and imaging studies  CBC:  Recent Labs Lab 09/18/15 2250 09/19/15 0120  WBC 9.9  --   NEUTROABS 8.8*  --  HGB 11.9* 11.9*  HCT 37.2* 35.0*  MCV 86.7  --   PLT 334  --    Basic Metabolic Panel:  Recent Labs Lab 09/18/15 2250 09/19/15 0120  NA 142 138  K 5.5* 6.5*  CL 105 102  CO2 31  --   GLUCOSE 131* 127*  BUN 19 28*  CREATININE 1.18 1.00  CALCIUM 9.5  --    GFR: Estimated Creatinine Clearance: 82.6 mL/min (by C-G formula based on SCr of 1 mg/dL). Liver Function Tests: No results for input(s): AST, ALT, ALKPHOS, BILITOT, PROT, ALBUMIN in the last 168 hours. No results for input(s): LIPASE, AMYLASE in the last 168 hours. No results for input(s): AMMONIA in the last 168 hours. Coagulation Profile: No results for input(s): INR, PROTIME in the last 168 hours. Cardiac Enzymes: No results for input(s): CKTOTAL, CKMB, CKMBINDEX, TROPONINI in the last 168 hours. BNP (last 3 results) No results for input(s): PROBNP in the last 8760 hours. HbA1C: No results for input(s): HGBA1C in the last 72 hours. CBG: No results for input(s): GLUCAP in the last 168 hours. Lipid Profile: No results for input(s): CHOL, HDL, LDLCALC, TRIG, CHOLHDL, LDLDIRECT in the last 72 hours. Thyroid Function Tests: No results for input(s): TSH, T4TOTAL, FREET4, T3FREE, THYROIDAB in the last 72 hours. Anemia Panel: No results for input(s): VITAMINB12, FOLATE, FERRITIN, TIBC, IRON, RETICCTPCT in the last 72  hours. Urine analysis:    Component Value Date/Time   COLORURINE YELLOW 01/09/2014 1843   APPEARANCEUR CLEAR 01/09/2014 1843   LABSPEC 1.023 01/09/2014 1843   PHURINE 5.5 01/09/2014 1843   GLUCOSEU 250 (A) 01/09/2014 1843   HGBUR NEGATIVE 01/09/2014 1843   BILIRUBINUR NEGATIVE 01/09/2014 1843   KETONESUR NEGATIVE 01/09/2014 1843   PROTEINUR NEGATIVE 01/09/2014 1843   UROBILINOGEN 0.2 01/09/2014 1843   NITRITE NEGATIVE 01/09/2014 1843   LEUKOCYTESUR NEGATIVE 01/09/2014 1843   Sepsis Labs: @LABRCNTIP (procalcitonin:4,lacticidven:4) )No results found for this or any previous visit (from the past 240 hour(s)).   Radiological Exams on Admission: No results found.   Assessment/Plan Principal Problem:   Epistaxis Active Problems:   Sarcoid (HCC)   OSA on CPAP   Chronic respiratory failure with hypoxia (H[facility])   Essential hypertension    1. Severe epistaxis requiring bilateral nasal packing - closely follow CBC and for any further bleeding. Patient has been empirically started on Unasyn. If there is no further bleeding patient will need to follow-up as outpatient with Dr. Jenne Pane, ENT surgeon. Patient states he does take aspirin which he did yesterday which will need to be held. Check INR. 2. 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 3. Hypertension - I have placed patient on IV metoprolol and when necessary IV hydralazine. Closely follow blood pressure trends. 4. Chronic respiratory failure with hypoxia in the setting of bronchiectasis sleep apnea and sarcoidosis - continue inhalers. CPAP when possible.   DVT prophylaxis: SCDs. Code Status: Full code.  Family Communication: Discussed with patient.  Disposition Plan: Home.  Consults called: ENT surgeon Dr. Jenne Pane.  Admission status: Observation.    Eduard Clos MD [facility] Pager [phone].  If 7PM-7AM, please contact  night-coverage [email or web address] Password [facility]  09/19/2015, 3:38 AM

## 2015-09-19 NOTE — ED Provider Notes (Addendum)
[facility]-EMERGENCY DEPT Provider Note   CSN: [id number] Arrival date & time: 09/18/15  2112     History   Chief Complaint Chief Complaint  Patient presents with  . Epistaxis    HPI [name] is a 50 y.o. male.  51-year-old male with past medical history including COPD on home oxygen, sarcoidosis, hypertension who presents with epistaxis. At approximately 8:30 PM tonight, the patient began spontaneously bleeding from both sides of his nose. He tried to stop it himself but eventually called EMS. He has never had this problem before. He denies any trauma to his nose. No anticoagulant use. No nausea or vomiting. No pain.  LEVEL 5 CAVEAT DUE TO RESPIRATORY DISTRESS AND ACUITY OF CONDITION   The history is provided by the patient and a friend.  Epistaxis      Past Medical History:  Diagnosis Date  . Bronchitis   . Chronic respiratory failure (HCC)    home O@ 4L (01/28/14)  . COPD (chronic obstructive pulmonary disease) (HCC)   . Hypertension   . Neuromuscular disorder (HCC)   . OSA on CPAP   . Pneumonia   . Sarcoidosis [facility])     Patient Active Problem List   Diagnosis Date Noted  . COPD exacerbation (HCC) 07/21/2015  . Sarcoidosis (HCC) 09/01/2014  . SOB (shortness of breath) 03/23/2014  . Essential hypertension   . Chronic respiratory failure with hypoxia (HCC) 02/11/2014  . Neuropathy (HCC) 01/26/2014  . Abnormal bruising 01/23/2014  . Foot pain, bilateral 01/23/2014  . Discoloration of skin of toe 01/23/2014  . Chronic pain 11/23/2013  . Abdominal pain, epigastric 08/03/2012  . Nuclear sclerotic cataract 07/06/2012  . Normocytic anemia 06/16/2012  . GERD (gastroesophageal reflux disease) 06/16/2012  . Bronchiectasis without acute exacerbation (HCC) 03/05/2012  . Hyperglycemia, drug-induced 07/27/2011  . Error, refractive, myopia 02/15/2011  . Sarcoid (HCC) 01/15/2011  . OSA on CPAP 01/15/2011    Past Surgical History:  Procedure Laterality Date  . arm  surgery     . ROTATOR CUFF REPAIR    . VIDEO ASSISTED THORACOSCOPY (VATS)/THOROCOTOMY Left 07/17/2012   Procedure: VIDEO ASSISTED THORACOSCOPY (VATS)/BLEB Stapling;  Surgeon: Alleen Borne, MD;  Location: [facility] OR;  Service: Thoracic;  Laterality: Left;       Home Medications    Prior to Admission medications   Medication Sig Start Date End Date Taking? Authorizing Provider  albuterol (PROVENTIL) (2.5 MG/3ML) 0.083% nebulizer solution Take 2.5 mg by nebulization every 4 (four) hours as needed for wheezing or shortness of breath (wheezing and shortness of breath).  06/08/12  Yes Historical Provider, MD  bisacodyl (BISACODYL) 5 MG EC tablet Take 1 tablet (5 mg total) by mouth daily as needed for moderate constipation. 01/16/14  Yes Simonne Martinet, NP  bisoprolol (ZEBETA) 10 MG tablet Take 1 tablet (10 mg total) by mouth daily. 03/26/14  Yes Maryann Mikhail, DO  budesonide-formoterol (SYMBICORT) 160-4.5 MCG/ACT inhaler Inhale 2 puffs into the lungs 2 (two) times daily.   Yes Historical Provider, MD  calcium-vitamin D (OSCAL WITH D) 500-200 MG-UNIT per tablet Take 1 tablet by mouth every morning.   Yes Historical Provider, MD  cyclobenzaprine (FLEXERIL) 10 MG tablet Take 10 mg by mouth 3 (three) times daily as needed for muscle spasms (muscle spasms).    Yes Historical Provider, MD  diltiazem (CARDIZEM CD) 120 MG 24 hr capsule Take 1 capsule (120 mg total) by mouth daily. 01/28/14  Yes Jeanella Craze, NP  diphenhydrAMINE (BENADRYL) 25 MG tablet  Take 25 mg by mouth every 6 (six) hours as needed for allergies (allergies).    Yes Historical Provider, MD  Ergocalciferol (VITAMIN D2) 400 units TABS Take 400 Units by mouth daily. 03/18/08  Yes Historical Provider, MD  ferrous sulfate 325 (65 FE) MG tablet Take 1 tablet (325 mg total) by mouth 2 (two) times daily with a meal. 06/18/12  Yes Leroy SeaPrashant K Singh, MD  fluticasone (FLONASE) 50 MCG/ACT nasal spray Place 2 sprays into both nostrils 2 (two) times  daily. Patient taking differently: Place 2 sprays into both nostrils 2 (two) times daily as needed for allergies.  01/28/14  Yes Jeanella CrazeBrandi L Ollis, NP  guaiFENesin (MUCINEX) 600 MG 12 hr tablet Take 1 tablet (600 mg total) by mouth 2 (two) times daily. 07/26/15  Yes Costin Otelia SergeantM Gherghe, MD  magnesium citrate SOLN Take 1 Bottle by mouth daily as needed for mild constipation or severe constipation.   Yes Historical Provider, MD  Multiple Vitamins-Minerals (MULTIVITAMIN WITH MINERALS) tablet Take 1 tablet by mouth every morning.    Yes Historical Provider, MD  omeprazole (PRILOSEC) 40 MG capsule Take 40 mg by mouth daily. 11/04/14  Yes Historical Provider, MD  ondansetron (ZOFRAN) 4 MG tablet Take 4 mg by mouth every 8 (eight) hours as needed for nausea or vomiting.  09/05/14  Yes Historical Provider, MD  polyethylene glycol (MIRALAX / GLYCOLAX) packet Take 17 g by mouth daily. Patient taking differently: Take 17 g by mouth daily as needed for mild constipation or moderate constipation.  01/16/14  Yes Simonne MartinetPeter E Babcock, NP  predniSONE (DELTASONE) 20 MG tablet Take 3 tablets (60 mg total) by mouth daily with breakfast. 60 mg for 2 days then 50 mg for 3 days then 40 mg for 3 days then 20 mg for 3 days then 10 mg for 3 days 07/26/15  Yes Costin Otelia SergeantM Gherghe, MD  VENTOLIN HFA 108 (90 Base) MCG/ACT inhaler INHALE TWO PUFFS BY MOUTH EVERY 6 HOURS AS NEEDED FOR WHEEZING OR SHORTNESS OF BREATH 06/30/15  Yes Leslye Peerobert S Byrum, MD  doxycycline (VIBRAMYCIN) 100 MG capsule Take 1 capsule (100 mg total) by mouth 2 (two) times daily. One po bid x 7 days Patient not taking: Reported on 09/18/2015 07/26/15   Leatha Gildingostin M Gherghe, MD    Family History Family History  Problem Relation Age of Onset  . Diabetes Father     Social History Social History  Substance Use Topics  . Smoking status: Never Smoker  . Smokeless tobacco: Never Used  . Alcohol use Yes     Comment: social     Allergies   Clindamycin/lincomycin   Review of  Systems Review of Systems  Unable to perform ROS: Acuity of condition  HENT: Positive for nosebleeds.      Physical Exam Updated Vital Signs BP 129/100 (BP Location: Right Arm)   Pulse 106   Resp 26   SpO2 100%   Physical Exam  Constitutional: He is oriented to person, place, and time. He appears well-developed and well-nourished. He appears distressed.  Sitting forward with blood pouring from nose, tachypneic  HENT:  Head: Normocephalic and atraumatic.  Steady stream of BRB from b/l nares and in mouth, unable to visualize source due to amount of bleeding and presence of clots  Eyes: Conjunctivae are normal. Pupils are equal, round, and reactive to light.  Neck: Neck supple.  Cardiovascular: Regular rhythm.  Tachycardia present.   Murmur heard.  Systolic murmur is present with a grade of 3/6  Pulmonary/Chest: Breath sounds normal. Tachypnea noted. No respiratory distress.  Tachypnea, Prolonged expiratory phase  Abdominal: Soft. Bowel sounds are normal. He exhibits no distension. There is no tenderness.  Musculoskeletal: He exhibits no edema.  Neurological: He is alert and oriented to person, place, and time.  Fluent speech  Skin: Skin is warm and dry.  Psychiatric: He has a normal mood and affect.  Nursing note and vitals reviewed.    ED Treatments / Results  Labs (all labs ordered are listed, but only abnormal results are displayed) Labs Reviewed  BASIC METABOLIC PANEL - Abnormal; Notable for the following:       Result Value   Potassium 5.5 (*)    Glucose, Bld 131 (*)    All other components within normal limits  CBC WITH DIFFERENTIAL/PLATELET - Abnormal; Notable for the following:    Hemoglobin 11.9 (*)    HCT 37.2 (*)    Neutro Abs 8.8 (*)    Lymphs Abs 0.6 (*)    All other components within normal limits  I-STAT CHEM 8, ED - Abnormal; Notable for the following:    Potassium 6.5 (*)    BUN 28 (*)    Glucose, Bld 127 (*)    Calcium, Ion 1.14 (*)     Hemoglobin 11.9 (*)    HCT 35.0 (*)    All other components within normal limits  I-STAT CHEM 8, ED  TYPE AND SCREEN    EKG  EKG Interpretation  Date/Time:  Friday September 19 2015 01:31:32 EDT Ventricular Rate:  102 PR Interval:    QRS Duration: 90 QT Interval:  334 QTC Calculation: 435 R Axis:   75 Text Interpretation:  Sinus tachycardia Right atrial enlargement mildly peaked T waves compared to previous, tachycardia is similar Confirmed by Khyrin Trevathan MD, Magdalina Whitehead ([id number]) on 09/19/2015 1:44:48 AM       Radiology No results found.  Procedures .Epistaxis Management Date/Time: 09/22/2015 9:52 AM Performed by: Laurence Spates Authorized by: Laurence Spates   Consent:    Consent obtained:  Emergent situation Procedure details:    Treatment site:  L anterior and R anterior   Treatment method:  Nasal balloon   Treatment complexity:  Extensive   Treatment episode: initial   Post-procedure details:    Assessment:  Bleeding stopped   Patient tolerance of procedure:  Tolerated well, no immediate complications Comments:     Placed b/l rhino rockets and inflated b/l anterior balloons; later placed rapid rhino in L naris alongside rhino rockets   (including critical care time)  CRITICAL CARE Performed by: Ambrose Finland Carollee Nussbaumer   Total critical care time: 45 minutes  Critical care time was exclusive of separately billable procedures and treating other patients.  Critical care was necessary to treat or prevent imminent or life-threatening deterioration.  Critical care was time spent personally by me on the following activities: development of treatment plan with patient and/or surrogate as well as nursing, discussions with consultants, evaluation of patient's response to treatment, examination of patient, obtaining history from patient or surrogate, ordering and performing treatments and interventions, ordering and review of laboratory studies, ordering and review of  radiographic studies, pulse oximetry and re-evaluation of patient's condition.   Medications Ordered in ED Medications  oxymetazoline (AFRIN) 0.05 % nasal spray 1 spray (not administered)  ipratropium-albuterol (DUONEB) 0.5-2.5 (3) MG/3ML nebulizer solution 3 mL (3 mLs Nebulization Given 09/18/15 2249)  sodium chloride 0.9 % bolus 1,000 mL (1,000 mLs Intravenous New Bag/Given 09/18/15 2233)  ipratropium-albuterol (  DUONEB) 0.5-2.5 (3) MG/3ML nebulizer solution 3 mL (3 mLs Nebulization Given 09/18/15 2322)  albuterol (PROVENTIL) (2.5 MG/3ML) 0.083% nebulizer solution 2.5 mg (2.5 mg Nebulization Given 09/18/15 2322)     Initial Impression / Assessment and Plan / ED Course  I have reviewed the triage vital signs and the nursing notes.  Pertinent labs & imaging results that were available during my care of the patient were reviewed by me and considered in my medical decision making (see chart for details).  Clinical Course    Pt w/ Spontaneous, bilateral epistaxis. He was sitting up in bed with a steady stream of blood coming from bilateral nares, in mild distress. Immediately placed on a nonrebreather and placed bilateral Rhino Rockets with inflation of anterior balloons. Later placed second small Rhino rocket in left naris. Gave the patient an IV fluid bolus and obtained basic lab work which shows K 5.5 Hgb 11.9 similar to previous but I am concerned about acute blood loss given amount of bleeding here and his tachycardia. On multiple reexaminations, the patient's bleeding has been controlled and his work of breathing has improved. Gave patient a fluid bolus and duoneb.   Given his tachycardia, significant bleeding, and hyperkalemia, discussed admission with Dr. Toniann Fail who will admit for obs.  Final Clinical Impressions(s) / ED Diagnoses   Final diagnoses:  Epistaxis  Hyperkalemia    New Prescriptions New Prescriptions   No medications on file     Laurence Spates, MD 09/19/15  0225    Laurence Spates, MD 09/22/15 [phone]

## 2015-09-19 NOTE — ED Notes (Signed)
Hospitalist at bedside 

## 2015-09-20 DIAGNOSIS — D869 Sarcoidosis, unspecified: Secondary | ICD-10-CM

## 2015-09-20 DIAGNOSIS — R040 Epistaxis: Secondary | ICD-10-CM

## 2015-09-20 DIAGNOSIS — E875 Hyperkalemia: Secondary | ICD-10-CM

## 2015-09-20 DIAGNOSIS — I10 Essential (primary) hypertension: Secondary | ICD-10-CM

## 2015-09-20 DIAGNOSIS — J9611 Chronic respiratory failure with hypoxia: Secondary | ICD-10-CM | POA: Diagnosis not present

## 2015-09-20 DIAGNOSIS — G4733 Obstructive sleep apnea (adult) (pediatric): Secondary | ICD-10-CM

## 2015-09-20 MED ORDER — HYDROCODONE-HOMATROPINE 5-1.5 MG/5ML PO SYRP
5.0000 mL | ORAL_SOLUTION | Freq: Four times a day (QID) | ORAL | Status: DC | PRN
  Administered 2015-09-20: 5 mL via ORAL
  Filled 2015-09-20: qty 5

## 2015-09-20 MED ORDER — AYR SALINE NASAL NA GEL: 1.0000 "application " | NASAL | 0 refills | Status: DC | PRN

## 2015-09-20 NOTE — Care Management Note (Addendum)
Case Management Note  Patient Details  Name: [name] MRN: [id number] Date of Birth: 12/31/1965  Subjective/Objective:        Sarcoidosis,  Epistaxis            Action/Plan: Discharge Planning: AVS reviewed: NCM spoke to pt and he uses oxygen with [employer]. Contacted [employer] for face mask with oxygen. NCM did get in contact [employer] # [phone]. Waiting call back from RT.  PCP -Clydie BraunENIZARD-THOMPSON, NANCY MD   Expected Discharge Date:  09/20/2015             Expected Discharge Plan:  Home/Self Care  In-House Referral:  NA  Discharge planning Services  CM Consult  Post Acute Care Choice:  Durable Medical Equipment Choice offered to:  Patient  DME Arranged:  Other see comment DME Agency:  [employer] (oxygen)  HH Arranged:  NA HH Agency:  NA  Status of Service:  Completed, signed off  If discussed at Long Length of Stay Meetings, dates discussed:    Additional Comments:  Elliot CousinShavis, Janoah Menna Ellen, RN 09/20/2015, 11:34 AM

## 2015-09-20 NOTE — Discharge Summary (Signed)
Physician Discharge Summary  [name] ZOX:[id number] DOB: 1965-03-13 DOA: 09/18/2015  PCP: Clydie Braun, MD  Admit date: 09/18/2015 Discharge date: 09/20/2015  Admitted From: home  Disposition:  home     Home Health:  none  Equipment/Devices:  none    Discharge Condition:  stable   CODE STATUS:  Full code   Diet recommendation:  Heart healthy Consultations:  ENT    Discharge Diagnoses:  Principal Problem:   Epistaxis Active Problems:   Sarcoid (HCC)   OSA on CPAP   Chronic respiratory failure with hypoxia (HCC)   Essential hypertension   Hyperkalemia    Subjective: No further bleeding from nose. No other complaints.   Brief Summary: [name] is a 50 y.o. 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 Patient states he had picked his nose and following which started developing epistaxis. As per the ER physician patient had severe epistaxis and had to have nasal packing done in both the nostrils. Presently bleeding is controlled with packing. ER physician had discussed with Dr. Jenne Pane on-call ENT surgeon who advised antibiotics and follow-up with Dr. Jenne Pane as outpatient if there is no further bleeding. 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 - no longer bleeding when evaluated by ENT- bleeding has not recurred- stable for discharge today - will try to obtain face mask for home use over the next few days while healing of nasal mucosa occurs - have recommended saline nasal gel  Active Problems:   Sarcoidosis   Chronic respiratory failure with hypoxia  - chronically on 3-4 L O2    OSA on CPAP    Essential  hypertension - holding antihypertensives   Discharge Instructions  Discharge Instructions    Diet - low sodium heart healthy    Complete by:  As directed   Increase activity slowly    Complete by:  As directed       Medication List    TAKE these medications   albuterol (2.5 MG/3ML) 0.083% nebulizer solution Commonly known as:  PROVENTIL Take 2.5 mg by nebulization every 4 (four) hours as needed for wheezing or shortness of breath (wheezing and shortness of breath).   VENTOLIN HFA 108 (90 Base) MCG/ACT inhaler Generic drug:  albuterol INHALE TWO PUFFS BY MOUTH EVERY 6 HOURS AS NEEDED FOR WHEEZING OR SHORTNESS OF BREATH   bisacodyl 5 MG EC tablet Commonly known as:  bisacodyl Take 1 tablet (5 mg total) by mouth daily as needed for moderate constipation.   bisoprolol 10 MG tablet Commonly known as:  ZEBETA Take 1 tablet (10 mg total) by mouth daily.   budesonide-formoterol 160-4.5 MCG/ACT inhaler Commonly known as:  SYMBICORT Inhale 2 puffs into the lungs 2 (two) times daily.   calcium-vitamin D 500-200 MG-UNIT tablet Commonly known as:  OSCAL WITH D Take 1 tablet by mouth every morning.   cyclobenzaprine 10 MG tablet Commonly known as:  FLEXERIL Take 10 mg by mouth 3 (three) times daily as needed for muscle spasms (muscle spasms).   diltiazem 120 MG 24 hr capsule Commonly known as:  CARDIZEM CD Take 1 capsule (120 mg total) by mouth daily.   diphenhydrAMINE 25 MG tablet Commonly known as:  BENADRYL Take  25 mg by mouth every 6 (six) hours as needed for allergies (allergies).   ferrous sulfate 325 (65 FE) MG tablet Take 1 tablet (325 mg total) by mouth 2 (two) times daily with a meal.   guaiFENesin 600 MG 12 hr tablet Commonly known as:  MUCINEX Take 1 tablet (600 mg total) by mouth 2 (two) times daily.   magnesium citrate Soln Take 1 Bottle by mouth daily as needed for mild constipation or severe constipation.   multivitamin with minerals tablet Take 1  tablet by mouth every morning.   omeprazole 40 MG capsule Commonly known as:  PRILOSEC Take 40 mg by mouth daily.   ondansetron 4 MG tablet Commonly known as:  ZOFRAN Take 4 mg by mouth every 8 (eight) hours as needed for nausea or vomiting.   polyethylene glycol packet Commonly known as:  MIRALAX / GLYCOLAX Take 17 g by mouth daily. What changed:  when to take this  reasons to take this   predniSONE 20 MG tablet Commonly known as:  DELTASONE Take 3 tablets (60 mg total) by mouth daily with breakfast. 60 mg for 2 days then 50 mg for 3 days then 40 mg for 3 days then 20 mg for 3 days then 10 mg for 3 days   saline Gel Place 1 application into the nose every 4 (four) hours as needed.   Vitamin D2 400 units Tabs Take 400 Units by mouth daily.       Allergies  Allergen Reactions  . Clindamycin/Lincomycin Anaphylaxis and Swelling    Made face and tongue swell     Procedures/Studies:  No results found.     Discharge Exam: Vitals:   09/19/15 2100 09/20/15 0500  BP: (!) 121/92 (!) 133/92  Pulse: 96 (!) 104  Resp: 18 20  Temp: 97.7 F (36.5 C) 97.5 F (36.4 C)   Vitals:   09/19/15 2100 09/19/15 2221 09/19/15 2224 09/20/15 0500  BP: (!) 121/92   (!) 133/92  Pulse: 96   (!) 104  Resp: 18   20  Temp: 97.7 F (36.5 C)   97.5 F (36.4 C)  TempSrc: Axillary   Axillary  SpO2:  99% 99% 99%  Weight:      Height:        General: Pt is alert, awake, not in acute distress Cardiovascular: RRR, S1/S2 +, no rubs, no gallops Respiratory: CTA bilaterally, no wheezing, no rhonchi Abdominal: Soft, NT, ND, bowel sounds + Extremities: no edema, no cyanosis    The results of significant diagnostics from this hospitalization (including imaging, microbiology, ancillary and laboratory) are listed below for reference.     Microbiology: No results found for this or any previous visit (from the past 240 hour(s)).   Labs: BNP (last 3 results)  Recent Labs   01/13/15 1205  BNP 11.8   Basic Metabolic Panel:  Recent Labs Lab 09/18/15 2250 09/19/15 0120 09/19/15 0413  NA 142 138 140  K 5.5* 6.5* 4.0  CL 105 102 104  CO2 31  --  30  GLUCOSE 131* 127* 113*  BUN 19 28* 21*  CREATININE 1.18 1.00 0.87  CALCIUM 9.5  --  8.9   Liver Function Tests: No results for input(s): AST, ALT, ALKPHOS, BILITOT, PROT, ALBUMIN in the last 168 hours. No results for input(s): LIPASE, AMYLASE in the last 168 hours. No results for input(s): AMMONIA in the last 168 hours. CBC:  Recent Labs Lab 09/18/15 2250 09/19/15 0120 09/19/15 0413 09/19/15 0739 09/19/15  1053  WBC 9.9  --  8.2 9.1 10.3  NEUTROABS 8.8*  --  6.6  --   --   HGB 11.9* 11.9* 10.8* 10.7* 10.9*  HCT 37.2* 35.0* 33.2* 32.9* 33.7*  MCV 86.7  --  86.7 84.6 85.1  PLT 334  --  257 244 266   Cardiac Enzymes: No results for input(s): CKTOTAL, CKMB, CKMBINDEX, TROPONINI in the last 168 hours. BNP: Invalid input(s): POCBNP CBG: No results for input(s): GLUCAP in the last 168 hours. D-Dimer No results for input(s): DDIMER in the last 72 hours. Hgb A1c No results for input(s): HGBA1C in the last 72 hours. Lipid Profile No results for input(s): CHOL, HDL, LDLCALC, TRIG, CHOLHDL, LDLDIRECT in the last 72 hours. Thyroid function studies No results for input(s): TSH, T4TOTAL, T3FREE, THYROIDAB in the last 72 hours.  Invalid input(s): FREET3 Anemia work up No results for input(s): VITAMINB12, FOLATE, FERRITIN, TIBC, IRON, RETICCTPCT in the last 72 hours. Urinalysis    Component Value Date/Time   COLORURINE YELLOW 01/09/2014 1843   APPEARANCEUR CLEAR 01/09/2014 1843   LABSPEC 1.023 01/09/2014 1843   PHURINE 5.5 01/09/2014 1843   GLUCOSEU 250 (A) 01/09/2014 1843   HGBUR NEGATIVE 01/09/2014 1843   BILIRUBINUR NEGATIVE 01/09/2014 1843   KETONESUR NEGATIVE 01/09/2014 1843   PROTEINUR NEGATIVE 01/09/2014 1843   UROBILINOGEN 0.2 01/09/2014 1843   NITRITE NEGATIVE 01/09/2014 1843    LEUKOCYTESUR NEGATIVE 01/09/2014 1843   Sepsis Labs Invalid input(s): PROCALCITONIN,  WBC,  LACTICIDVEN Microbiology No results found for this or any previous visit (from the past 240 hour(s)).   Time coordinating discharge: Over 30 minutes  SIGNED:   Calvert Cantor, MD  [facility] 09/20/2015, 8:40 AM Pager   If 7PM-7AM, please contact night-coverage [email or web address] Password [email or web address]

## 2015-09-20 NOTE — Progress Notes (Signed)
Chaplain visit the result of a Spiritual Consult placed in Mr [name]' EPIC chart. The consult gave no reason for the referral. Mr [name] and his room nurse do no know why the [place] was placed in his chart. Mr [name] was in the process of being discharged at the time of the chaplain visit. The [place] is being marked completed.  Benjie Karvonenharles D. Kanoa Phillippi, MDiv Chaplain

## 2015-09-20 NOTE — Progress Notes (Signed)
Pt stable for d/c home. No further nose bleeding.  Pt will d/c using Venti mask 30% and Home health is aware and following up this afternoon 09/20/2015.  AVS reviewed at bedside, instructed pt to pick up saline nasal gel OTC at local pharmacy. Pt also instructed to use mask for first few days at home prior to using nasal cannula to allow nasal healing.  No further questions from pt. Justin Mendaudle, Haileigh Pitz H, RN

## 2015-09-26 ENCOUNTER — Telehealth: Payer: Self-pay | Admitting: Emergency Medicine

## 2015-09-26 NOTE — Telephone Encounter (Signed)
Called spoke with pt. Aware we currently do not have any symbicort samples. Nothing further needed

## 2015-09-30 ENCOUNTER — Telehealth: Payer: Self-pay | Admitting: Emergency Medicine

## 2015-09-30 MED ORDER — BUDESONIDE-FORMOTEROL FUMARATE 160-4.5 MCG/ACT IN AERO: 2.0000 | INHALATION_SPRAY | Freq: Two times a day (BID) | RESPIRATORY_TRACT | 0 refills | Status: DC

## 2015-09-30 NOTE — Telephone Encounter (Signed)
Pt aware that Symbicort samples have been placed up front. Nothing further needed.

## 2015-10-09 ENCOUNTER — Ambulatory Visit: Payer: Commercial Managed Care - HMO | Admitting: Adult Health

## 2015-10-13 ENCOUNTER — Ambulatory Visit: Payer: Commercial Managed Care - HMO | Admitting: Adult Health

## 2015-11-11 ENCOUNTER — Telehealth: Payer: Self-pay | Admitting: Emergency Medicine

## 2015-11-11 NOTE — Telephone Encounter (Signed)
Called and spoke with pt and he stated that he needs a letter from [facility] today stating that he needs to have his electricity for his oxygen.   He stated that he will need this to keep his electricity on.  [facility] please advise. thanks

## 2015-11-12 ENCOUNTER — Encounter: Payer: Self-pay | Admitting: *Deleted

## 2015-11-12 NOTE — Telephone Encounter (Addendum)
Letter has been written. It has been placed up front for pick up. lmtcb x1 for pt.

## 2015-11-12 NOTE — Telephone Encounter (Signed)
Patient called advised that letter is ready for pick up -pr

## 2015-11-12 NOTE — Telephone Encounter (Signed)
OK to write a letter stating that the patient has severe obstructive lung disease and requires oxygen at all times, therefore is high risk should he lose his power. Thanks.

## 2015-11-15 IMAGING — CR DG CHEST 2V
2 series · 2 of 2 positions shown · non-contrast
Comparison: 09/01/2014.  05/31/2014.  01/17/2014.  CT 01/22/2014.

CLINICAL DATA: Shortness of breath.  Cough.  Sarcoidosis

EXAM:
CHEST  2 VIEW

[w chest pa]
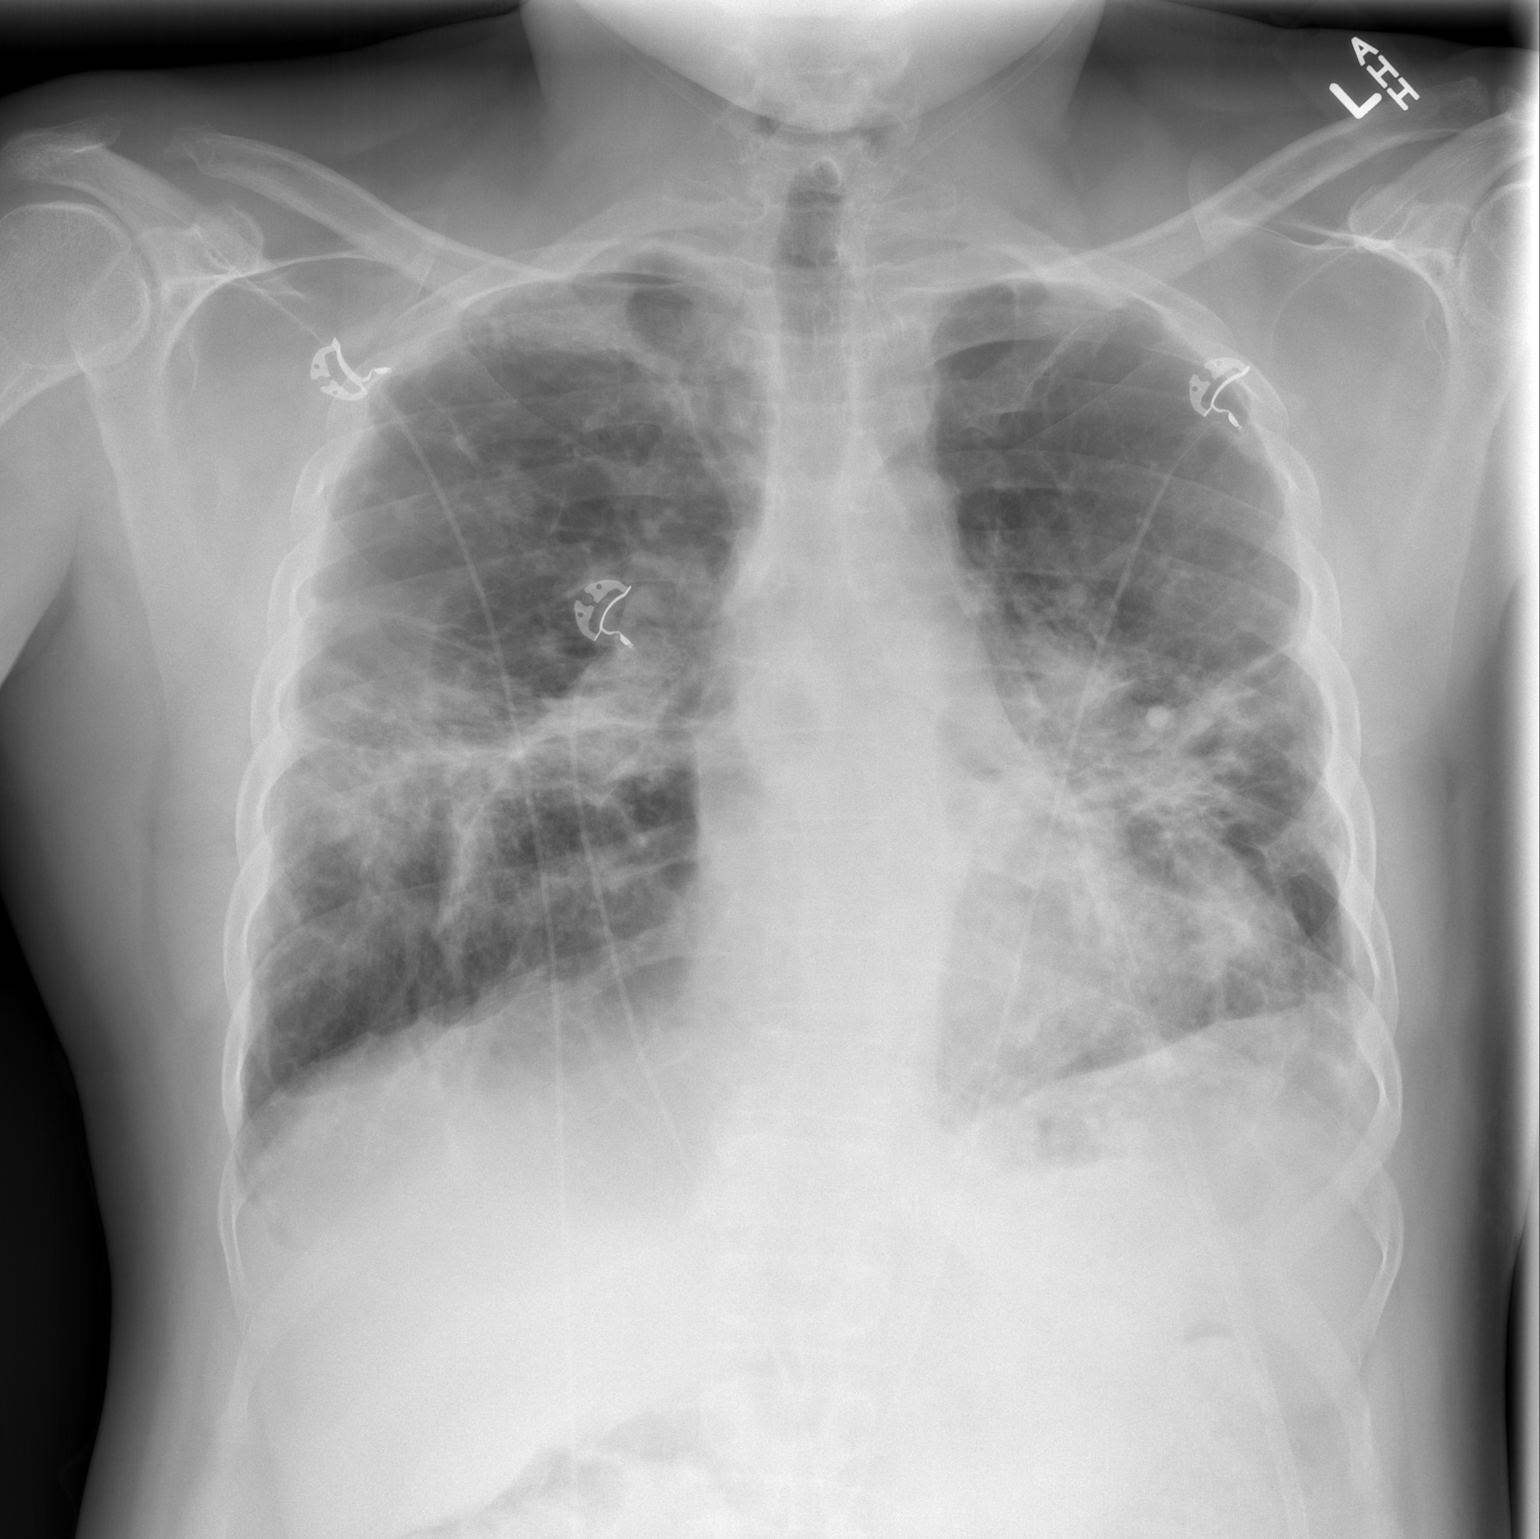

[w chest lat]
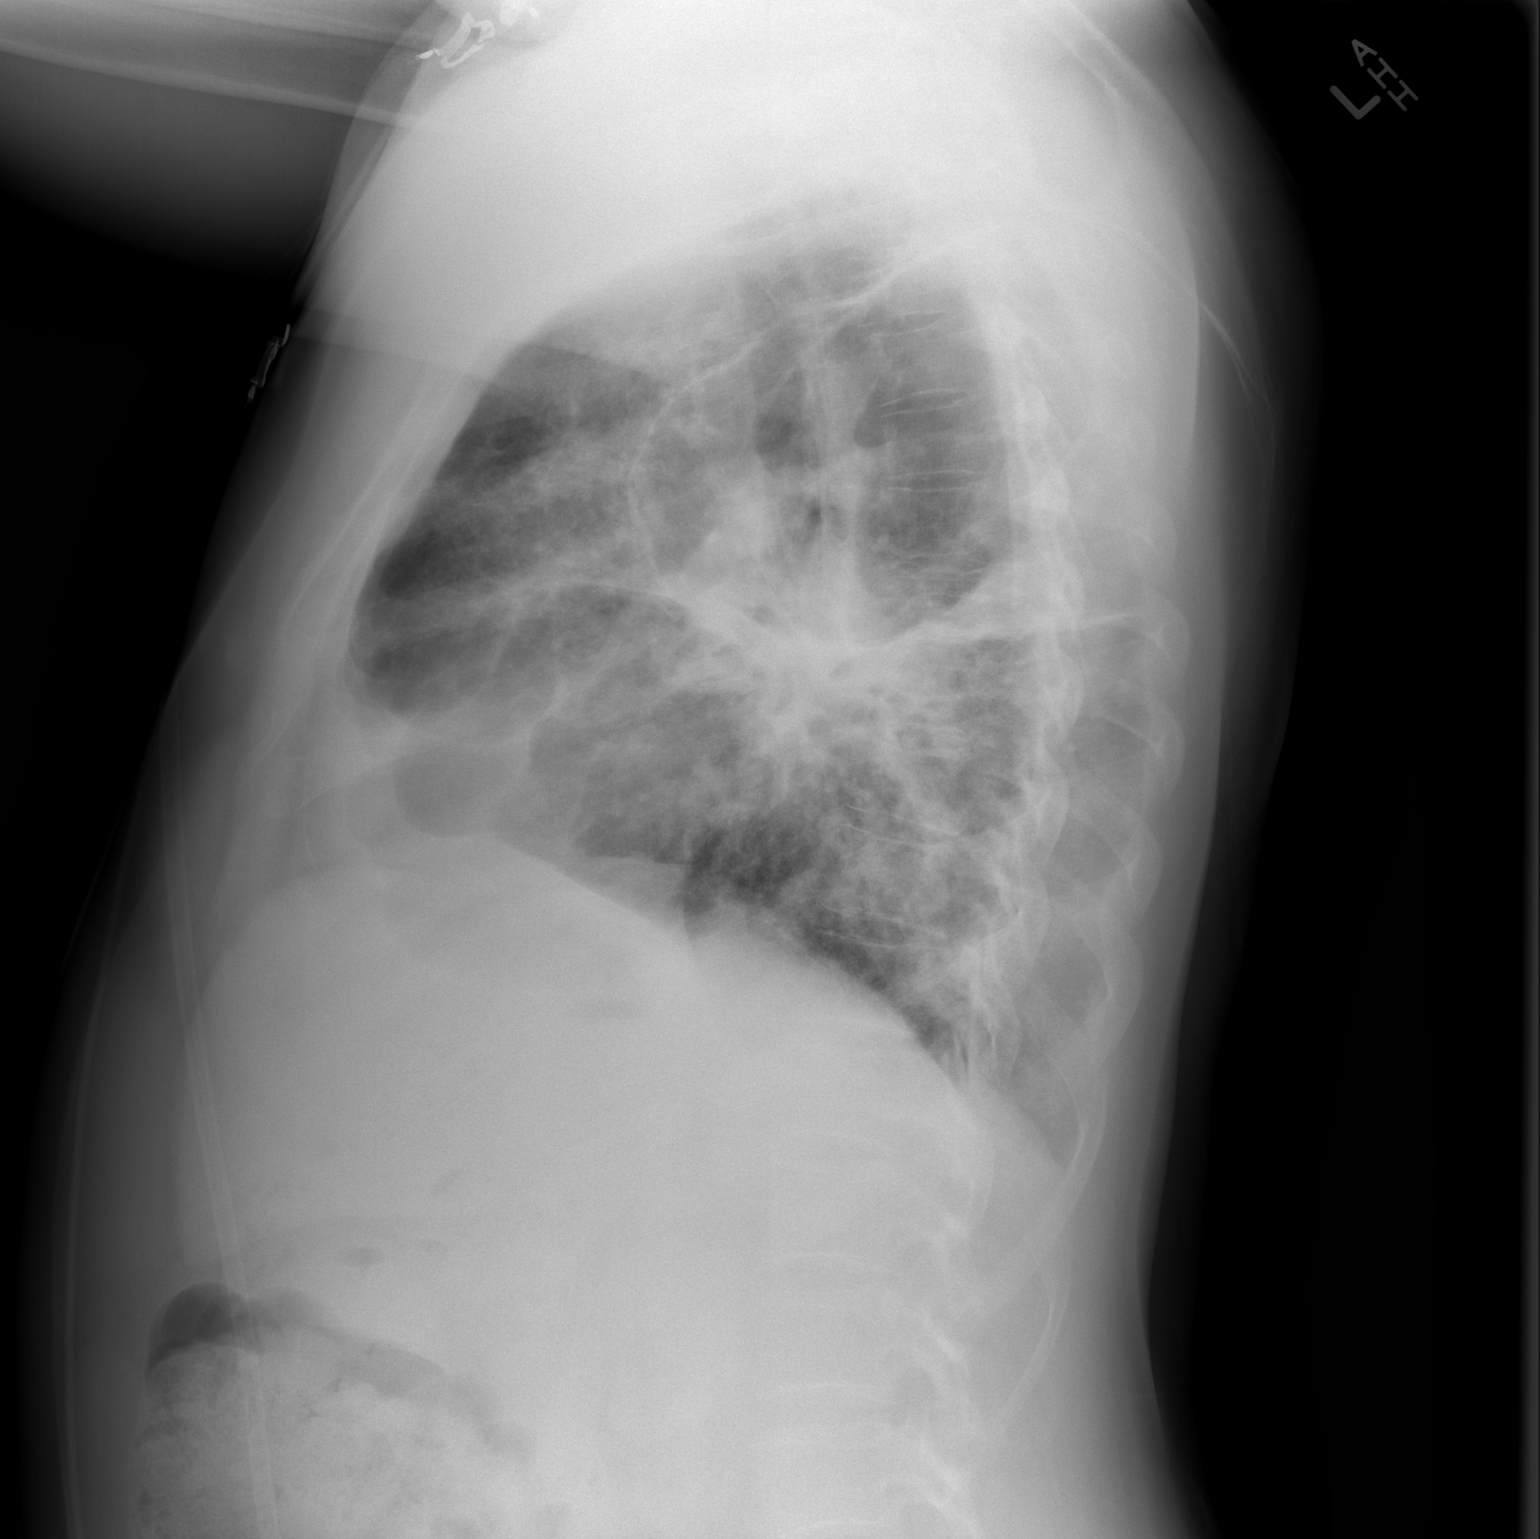

[2 of 2 positions shown; findings below may reference images not displayed]

FINDINGS: Mediastinum is stable. Stable hilar fullness consistent adenopathy.
Diffuse unchanged bilateral dense pleural parenchymal thickening
consistent with scarring noted in this patient with known
sarcoidosis. No acute infiltrate. Pleural thickening consistent with
scarring. No prominent pleural effusion or pneumothorax. Heart size
is stable. No acute bony abnormality .
IMPRESSION: Bilateral hilar adenopathy and severe pulmonary scarring consistent
with severe changes of chronic sarcoidosis. No interim change. No
acute infiltrate.

## 2015-11-24 ENCOUNTER — Emergency Department (HOSPITAL_COMMUNITY): Payer: Commercial Managed Care - HMO

## 2015-11-24 ENCOUNTER — Encounter (HOSPITAL_COMMUNITY): Payer: Self-pay | Admitting: Emergency Medicine

## 2015-11-24 ENCOUNTER — Emergency Department (HOSPITAL_COMMUNITY)
Admission: EM | Admit: 2015-11-24 | Discharge: 2015-11-24 | Disposition: A | Discharge status: Home / Self Care | Payer: Commercial Managed Care - HMO | Attending: Emergency Medicine | Admitting: Emergency Medicine

## 2015-11-24 DIAGNOSIS — I10 Essential (primary) hypertension: Secondary | ICD-10-CM | POA: Diagnosis not present

## 2015-11-24 DIAGNOSIS — K648 Other hemorrhoids: Secondary | ICD-10-CM | POA: Insufficient documentation

## 2015-11-24 DIAGNOSIS — R58 Hemorrhage, not elsewhere classified: Secondary | ICD-10-CM

## 2015-11-24 DIAGNOSIS — R05 Cough: Secondary | ICD-10-CM

## 2015-11-24 DIAGNOSIS — J441 Chronic obstructive pulmonary disease with (acute) exacerbation: Secondary | ICD-10-CM | POA: Diagnosis not present

## 2015-11-24 DIAGNOSIS — R059 Cough, unspecified: Secondary | ICD-10-CM

## 2015-11-24 LAB — CBC WITH DIFFERENTIAL/PLATELET
BASOS ABS: 0 10*3/uL (ref 0.0–0.1)
BASOS PCT: 0 %
EOS PCT: 12 %
Eosinophils Absolute: 0.7 10*3/uL (ref 0.0–0.7)
HCT: 39.7 % (ref 39.0–52.0)
Hemoglobin: 13 g/dL (ref 13.0–17.0)
LYMPHS PCT: 18 %
Lymphs Abs: 1 10*3/uL (ref 0.7–4.0)
MCH: 26.8 pg (ref 26.0–34.0)
MCHC: 32.7 g/dL (ref 30.0–36.0)
MCV: 81.9 fL (ref 78.0–100.0)
MONO ABS: 0.6 10*3/uL (ref 0.1–1.0)
Monocytes Relative: 10 %
Neutro Abs: 3.3 10*3/uL (ref 1.7–7.7)
Neutrophils Relative %: 60 %
PLATELETS: 267 10*3/uL (ref 150–400)
RBC: 4.85 MIL/uL (ref 4.22–5.81)
RDW: 12.6 % (ref 11.5–15.5)
WBC: 5.6 10*3/uL (ref 4.0–10.5)

## 2015-11-24 LAB — BASIC METABOLIC PANEL
Anion gap: 8 (ref 5–15)
BUN: 6 mg/dL (ref 6–20)
CALCIUM: 9.8 mg/dL (ref 8.9–10.3)
CO2: 32 mmol/L (ref 22–32)
Chloride: 101 mmol/L (ref 101–111)
Creatinine, Ser: 1.12 mg/dL (ref 0.61–1.24)
GFR calc Af Amer: >60 mL/min (ref >60)
GLUCOSE: 95 mg/dL (ref 65–99)
Potassium: 4.3 mmol/L (ref 3.5–5.1)
Sodium: 141 mmol/L (ref 135–145)

## 2015-11-24 MED ORDER — ACETAMINOPHEN 500 MG PO TABS
1000.0000 mg | ORAL_TABLET | Freq: Once | ORAL | Status: AC
  Administered 2015-11-24: 1000 mg via ORAL
  Filled 2015-11-24: qty 2

## 2015-11-24 MED ORDER — PREDNISONE 10 MG PO TABS: 40.0000 mg | ORAL_TABLET | Freq: Every day | ORAL | 0 refills | Status: AC

## 2015-11-24 MED ORDER — ALBUTEROL SULFATE (2.5 MG/3ML) 0.083% IN NEBU
2.5000 mg | INHALATION_SOLUTION | Freq: Once | RESPIRATORY_TRACT | Status: AC
  Administered 2015-11-24: 2.5 mg via RESPIRATORY_TRACT
  Filled 2015-11-24: qty 3

## 2015-11-24 MED ORDER — PREDNISONE 20 MG PO TABS
60.0000 mg | ORAL_TABLET | Freq: Once | ORAL | Status: AC
  Administered 2015-11-24: 60 mg via ORAL
  Filled 2015-11-24: qty 3

## 2015-11-24 MED ORDER — ALBUTEROL SULFATE (2.5 MG/3ML) 0.083% IN NEBU: 5.0000 mg | INHALATION_SOLUTION | Freq: Once | RESPIRATORY_TRACT | Status: DC

## 2015-11-24 MED ORDER — IPRATROPIUM-ALBUTEROL 0.5-2.5 (3) MG/3ML IN SOLN
3.0000 mL | RESPIRATORY_TRACT | Status: AC
  Administered 2015-11-24 (×3): 3 mL via RESPIRATORY_TRACT
  Filled 2015-11-24: qty 9

## 2015-11-24 MED ORDER — AZITHROMYCIN 250 MG PO TABS
500.0000 mg | ORAL_TABLET | Freq: Once | ORAL | Status: AC
  Administered 2015-11-24: 500 mg via ORAL
  Filled 2015-11-24: qty 2

## 2015-11-24 MED ORDER — AZITHROMYCIN 250 MG PO TABS: 250.0000 mg | ORAL_TABLET | Freq: Every day | ORAL | 0 refills | Status: AC

## 2015-11-24 NOTE — ED Notes (Signed)
Attempt IV to left AC unsuccessful. This Clinical research associatewriter will attempt US IV when US available, in use at present time. Anoscope given to [place] per request.

## 2015-11-24 NOTE — ED Notes (Addendum)
[facility] en route to start nebulizer treatment post DG.

## 2015-11-24 NOTE — ED Provider Notes (Signed)
[facility]-EMERGENCY DEPT Provider Note   CSN: [id number] Arrival date & time: 11/24/15  0940     History   Chief Complaint Chief Complaint  Patient presents with  . Shortness of Breath  . Cough    HPI [name] is a 50 y.o. male.  The history is provided by the patient.  Shortness of Breath  This is a chronic problem. The problem occurs continuously.The problem has been gradually worsening. Associated symptoms include rhinorrhea, cough, sputum production, orthopnea and chest pain (tightness, aching; intermittent; worse with coughing and palpation of the chest.). Pertinent negatives include no fever, no sore throat, no ear pain, no vomiting, no abdominal pain and no rash. He has tried inhaled steroids and beta-agonist inhalers (requiring more than usual) for the symptoms. The treatment provided mild relief. Associated medical issues include COPD, pneumonia (several years ago) and chronic lung disease (sarcoidosis on [facility]). Associated medical issues do not include PE, CAD or heart failure.  Cough  Associated symptoms include chest pain (tightness, aching; intermittent; worse with coughing and palpation of the chest.), rhinorrhea and shortness of breath. Pertinent negatives include no chills, no ear pain and no sore throat. His past medical history is significant for pneumonia (several years ago) and COPD.    Past Medical History:  Diagnosis Date  . Bronchitis   . Chronic respiratory failure (HCC)    home O@ 4L (01/28/14)  . COPD (chronic obstructive pulmonary disease) (HCC)   . Hypertension   . Neuromuscular disorder (HCC)   . OSA on CPAP   . Pneumonia   . Sarcoidosis [facility])     Patient Active Problem List   Diagnosis Date Noted  . Epistaxis 09/19/2015  . Hyperkalemia   . COPD exacerbation (HCC) 07/21/2015  . Sarcoidosis (HCC) 09/01/2014  . SOB (shortness of breath) 03/23/2014  . Essential hypertension   . Chronic respiratory failure with hypoxia (HCC) 02/11/2014  .  Neuropathy (HCC) 01/26/2014  . Abnormal bruising 01/23/2014  . Foot pain, bilateral 01/23/2014  . Discoloration of skin of toe 01/23/2014  . Chronic pain 11/23/2013  . Abdominal pain, epigastric 08/03/2012  . Nuclear sclerotic cataract 07/06/2012  . Normocytic anemia 06/16/2012  . GERD (gastroesophageal reflux disease) 06/16/2012  . Bronchiectasis without acute exacerbation (HCC) 03/05/2012  . Hyperglycemia, drug-induced 07/27/2011  . Error, refractive, myopia 02/15/2011  . Sarcoid (HCC) 01/15/2011  . OSA on CPAP 01/15/2011    Past Surgical History:  Procedure Laterality Date  . arm surgery     . ROTATOR CUFF REPAIR    . VIDEO ASSISTED THORACOSCOPY (VATS)/THOROCOTOMY Left 07/17/2012   Procedure: VIDEO ASSISTED THORACOSCOPY (VATS)/BLEB Stapling;  Surgeon: Alleen Borne, MD;  Location: [facility] OR;  Service: Thoracic;  Laterality: Left;       Home Medications    Prior to Admission medications   Medication Sig Start Date End Date Taking? Authorizing Provider  albuterol (PROVENTIL) (2.5 MG/3ML) 0.083% nebulizer solution Take 2.5 mg by nebulization every 4 (four) hours as needed for wheezing or shortness of breath (wheezing and shortness of breath).  06/08/12  Yes Historical Provider, MD  bisacodyl (BISACODYL) 5 MG EC tablet Take 1 tablet (5 mg total) by mouth daily as needed for moderate constipation. 01/16/14  Yes Simonne Martinet, NP  bisoprolol (ZEBETA) 10 MG tablet Take 1 tablet (10 mg total) by mouth daily. Patient taking differently: Take 10 mg by mouth at bedtime.  03/26/14  Yes Maryann Mikhail, DO  budesonide-formoterol (SYMBICORT) 160-4.5 MCG/ACT inhaler Inhale 2 puffs into the lungs  2 (two) times daily. 09/30/15 11/24/15 Yes Leslye Peerobert S Byrum, MD  calcium-vitamin D (OSCAL WITH D) 500-200 MG-UNIT per tablet Take 1 tablet by mouth every morning.   Yes Historical Provider, MD  cyclobenzaprine (FLEXERIL) 10 MG tablet Take 10 mg by mouth 3 (three) times daily as needed for muscle spasms (muscle  spasms).    Yes Historical Provider, MD  diltiazem (CARTIA XT) 120 MG 24 hr capsule Take 120 mg by mouth daily. 10/13/15  Yes Historical Provider, MD  diphenhydrAMINE (BENADRYL) 25 MG tablet Take 25 mg by mouth every 6 (six) hours as needed for allergies (allergies).    Yes Historical Provider, MD  Ergocalciferol (VITAMIN D2) 400 units TABS Take 400 Units by mouth daily. 03/18/08  Yes Historical Provider, MD  ferrous sulfate 325 (65 FE) MG tablet Take 1 tablet (325 mg total) by mouth 2 (two) times daily with a meal. 06/18/12  Yes Leroy SeaPrashant K Singh, MD  guaiFENesin (MUCINEX) 600 MG 12 hr tablet Take 1 tablet (600 mg total) by mouth 2 (two) times daily. Patient taking differently: Take 600 mg by mouth 2 (two) times daily as needed for cough or to loosen phlegm.  07/26/15  Yes Costin Otelia SergeantM Gherghe, MD  magnesium citrate SOLN Take 1 Bottle by mouth daily as needed for mild constipation, moderate constipation or severe constipation.    Yes Historical Provider, MD  Multiple Vitamins-Minerals (MULTIVITAMIN WITH MINERALS) tablet Take 1 tablet by mouth every morning.    Yes Historical Provider, MD  omeprazole (PRILOSEC) 40 MG capsule Take 40 mg by mouth daily. 11/04/14  Yes Historical Provider, MD  ondansetron (ZOFRAN) 4 MG tablet Take 4 mg by mouth every 8 (eight) hours as needed for nausea or vomiting.  09/05/14  Yes Historical Provider, MD  polyethylene glycol (MIRALAX / GLYCOLAX) packet Take 17 g by mouth daily. Patient taking differently: Take 17 g by mouth daily as needed for mild constipation or moderate constipation.  01/16/14  Yes Simonne MartinetPeter E Babcock, NP  saline (AYR) GEL Place 1 application into the nose every 4 (four) hours as needed. Patient taking differently: Place 1 application into the nose every 4 (four) hours as needed (dryness/ irritation).  09/20/15  Yes Calvert CantorSaima Rizwan, MD  VENTOLIN HFA 108 (90 Base) MCG/ACT inhaler INHALE TWO PUFFS BY MOUTH EVERY 6 HOURS AS NEEDED FOR WHEEZING OR SHORTNESS OF BREATH 06/30/15   Yes Leslye Peerobert S Byrum, MD  azithromycin (ZITHROMAX) 250 MG tablet Take 1 tablet (250 mg total) by mouth daily. Take first 2 tablets together, then 1 every day until finished. 11/25/15 11/29/15  Nira ConnPedro Eduardo Merari Pion, MD  diltiazem (CARDIZEM CD) 120 MG 24 hr capsule Take 1 capsule (120 mg total) by mouth daily. Patient not taking: Reported on 11/24/2015 01/28/14   Jeanella CrazeBrandi L Ollis, NP  predniSONE (DELTASONE) 10 MG tablet Take 4 tablets (40 mg total) by mouth daily. 11/25/15 11/29/15  Nira ConnPedro Eduardo Lindzee Gouge, MD    Family History Family History  Problem Relation Age of Onset  . Diabetes Father     Social History Social History  Substance Use Topics  . Smoking status: Never Smoker  . Smokeless tobacco: Never Used  . Alcohol use Yes     Comment: social     Allergies   Clindamycin/lincomycin   Review of Systems Review of Systems  Constitutional: Negative for chills and fever.  HENT: Positive for rhinorrhea. Negative for ear pain and sore throat.   Eyes: Negative for pain and visual disturbance.  Respiratory: Positive for cough,  sputum production and shortness of breath.   Cardiovascular: Positive for chest pain (tightness, aching; intermittent; worse with coughing and palpation of the chest.) and orthopnea. Negative for palpitations.  Gastrointestinal: Positive for blood in stool (several days). Negative for abdominal distention, abdominal pain, diarrhea, nausea and vomiting.  Genitourinary: Negative for dysuria and hematuria.  Musculoskeletal: Negative for arthralgias and back pain.  Skin: Negative for color change and rash.  Neurological: Negative for seizures and syncope.  All other systems reviewed and are negative.    Physical Exam Updated Vital Signs BP (!) 160/126   Pulse 109   Temp 97.6 F (36.4 C) (Oral)   Resp (!) 29   SpO2 100%   Physical Exam  Constitutional: He is oriented to person, place, and time. He appears well-developed and well-nourished. No distress.    HENT:  Head: Normocephalic and atraumatic.  Nose: Nose normal.  Eyes: Conjunctivae and EOM are normal. Pupils are equal, round, and reactive to light. Right eye exhibits no discharge. Left eye exhibits no discharge. No scleral icterus.  Neck: Normal range of motion. Neck supple.  Cardiovascular: Normal rate and regular rhythm.  Exam reveals no gallop and no friction rub.   No murmur heard. Pulmonary/Chest: Effort normal. No stridor. Tachypnea noted. He has wheezes. He has no rales.  Poor air movement throughout  Abdominal: Soft. He exhibits no distension. There is no tenderness.  Genitourinary: Rectal exam shows internal hemorrhoid. Rectal exam shows no external hemorrhoid, no fissure, no tenderness and anal tone normal. Prostate is enlarged. Prostate is not tender.  Musculoskeletal: He exhibits no edema or tenderness.  Neurological: He is alert and oriented to person, place, and time.  Skin: Skin is warm and dry. No rash noted. He is not diaphoretic. No erythema.  Psychiatric: He has a normal mood and affect.  Vitals reviewed.    ED Treatments / Results  Labs (all labs ordered are listed, but only abnormal results are displayed) Labs Reviewed  BASIC METABOLIC PANEL  CBC WITH DIFFERENTIAL/PLATELET    EKG  EKG Interpretation  Date/Time:  Monday November 24 2015 09:50:23 EST Ventricular Rate:  102 PR Interval:    QRS Duration: 85 QT Interval:  347 QTC Calculation: 452 R Axis:   92 Text Interpretation:  Sinus tachycardia Right atrial enlargement Borderline right axis deviation Baseline wander in lead(s) II III aVR aVL aVF No significant change since last tracing Confirmed by [facility] MD, Zamaria Brazzle ([id number]) on 11/24/2015 5:06:33 PM       Radiology Dg Chest 2 View  Result Date: 11/24/2015 CLINICAL DATA:  History of sarcoidosis. Cough with shortness of breath. Chest pain. EXAM: CHEST  2 VIEW COMPARISON:  October 02, 2014 and July 21, 2015 FINDINGS: Extensive scarring and  fibrotic type change remain stable, consistent chronic changes of sarcoidosis. There is no new opacity evident on either side. Heart size is normal. Distortion of the pulmonary vascularity is stable due to the extensive scarring. No adenopathy is appreciable on this study. No bone lesions. IMPRESSION: Extensive scarring and fibrosis remain stable. No new opacity. No adenopathy. Stable cardiac silhouette. Changes are consistent with known chronic sarcoidosis. Electronically Signed   By: Bretta BangWilliam  Woodruff III M.D.   On: 11/24/2015 10:23    Procedures Anoscopy Date/Time: 11/24/2015 2:08 PM Performed by: Nira ConnARDAMA, Oliveah Zwack EDUARDO Authorized by: Nira ConnARDAMA, Jenika Chiem EDUARDO  Consent: Verbal consent obtained. Consent given by: patient Patient understanding: patient states understanding of the procedure being performed Patient identity confirmed: verbally with patient Indications: rectal bleeding  Sedation:  Patient sedated: no Scope type: anoscope External exam performed: no Digital exam performed: yes Positive digital exam findings: prostate enlargement Negative digital exam findings: no laxity of anal sphincter, no occult blood in stool, no prostate tenderness and no prostate nodules Positive internal exam findings: internal hemorrhoid Internal hemorrhoid position: nine o'clock Internal hemorrhoid prolapsed: no Procedure termination: procedure complete Patient tolerance: Patient tolerated the procedure well with no immediate complications     (including critical care time)  Medications Ordered in ED Medications  ipratropium-albuterol (DUONEB) 0.5-2.5 (3) MG/3ML nebulizer solution 3 mL (3 mLs Nebulization Given 11/24/15 1058)  predniSONE (DELTASONE) tablet 60 mg (60 mg Oral Given 11/24/15 1114)  azithromycin (ZITHROMAX) tablet 500 mg (500 mg Oral Given 11/24/15 1235)  albuterol (PROVENTIL) (2.5 MG/3ML) 0.083% nebulizer solution 2.5 mg (2.5 mg Nebulization Given 11/24/15 1240)  acetaminophen  (TYLENOL) tablet 1,000 mg (1,000 mg Oral Given 11/24/15 1352)     Initial Impression / Assessment and Plan / ED Course  I have reviewed the triage vital signs and the nursing notes.  Pertinent labs & imaging results that were available during my care of the patient were reviewed by me and considered in my medical decision making (see chart for details).  Clinical Course    1. Shortness of breath and cough COPD exacerbation. Significant improvement following breathing treatments, steroids, Zithromax. Chest x-ray without evidence of pneumonia. However given increased sputum production will treat patient with prednisone and azithromycin. low suspicion for pulmonary embolism.  2. Hematochezia Patient reports previous colonoscopy without diagnosis of diverticulosis or polyps. Endoscopy here revealed internal bleeding hemorrhoids. Patient not on any anticoagulation with stable hemoglobin. PCP follow-up as needed.  3. Enlarged Prostate. Nontender. PCP follow up.  Final Clinical Impressions(s) / ED Diagnoses   Final diagnoses:  Cough  COPD exacerbation (HCC)  Internal hemorrhage   Disposition: Discharge  Condition: Good  I have discussed the results, Dx and Tx plan with the patient who expressed understanding and agree(s) with the plan. Discharge instructions discussed at great length. The patient was given strict return precautions who verbalized understanding of the instructions. No further questions at time of discharge.    Discharge Medication List as of 11/24/2015  2:26 PM    START taking these medications   Details  azithromycin (ZITHROMAX) 250 MG tablet Take 1 tablet (250 mg total) by mouth daily. Take first 2 tablets together, then 1 every day until finished., Starting Tue 11/25/2015, Until Sat 11/29/2015, Print        Follow Up: Clydie Braun, MD [address] [place] [address] [phone]  Schedule an appointment as soon as possible  for a visit  As needed      Nira Conn, MD 11/24/15 1711

## 2015-11-24 NOTE — ED Triage Notes (Signed)
Pt reports generalized body aches, cough and SOB for past several days. Pt on 3L [place] home O2 and is breathing 28 breaths per minute. Wheezing and accessory muscle use noted. Pt also having CP.

## 2015-11-24 NOTE — ED Notes (Signed)
Nebulizer in process at present time.

## 2015-12-10 IMAGING — CR DG CHEST 2V
2 series · 2 of 2 positions shown · non-contrast
Comparison: 09/04/2014

CLINICAL DATA: Left-sided chest pain.  History of sarcoidosis.

EXAM:
CHEST  2 VIEW

[w chest pa]
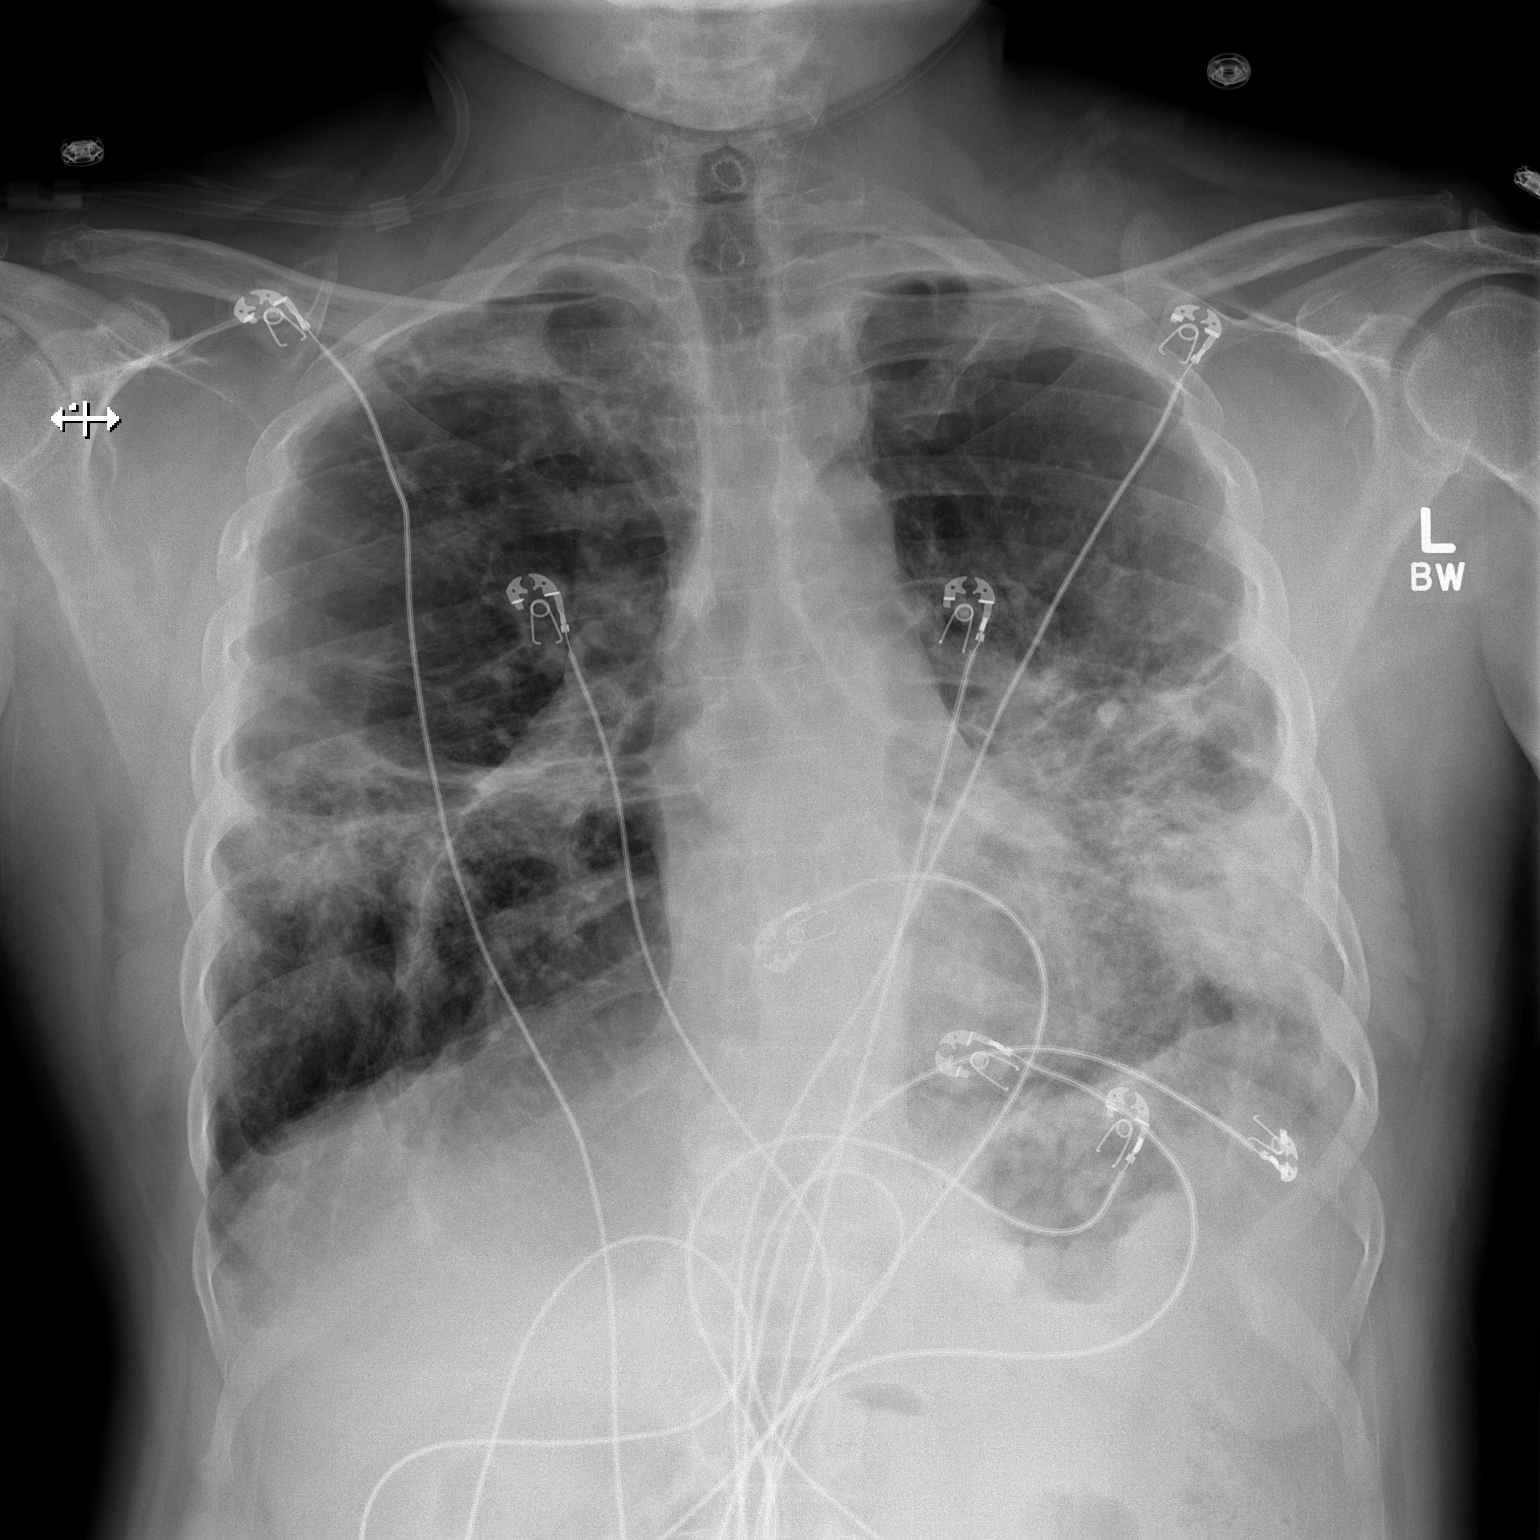

[w chest lat]
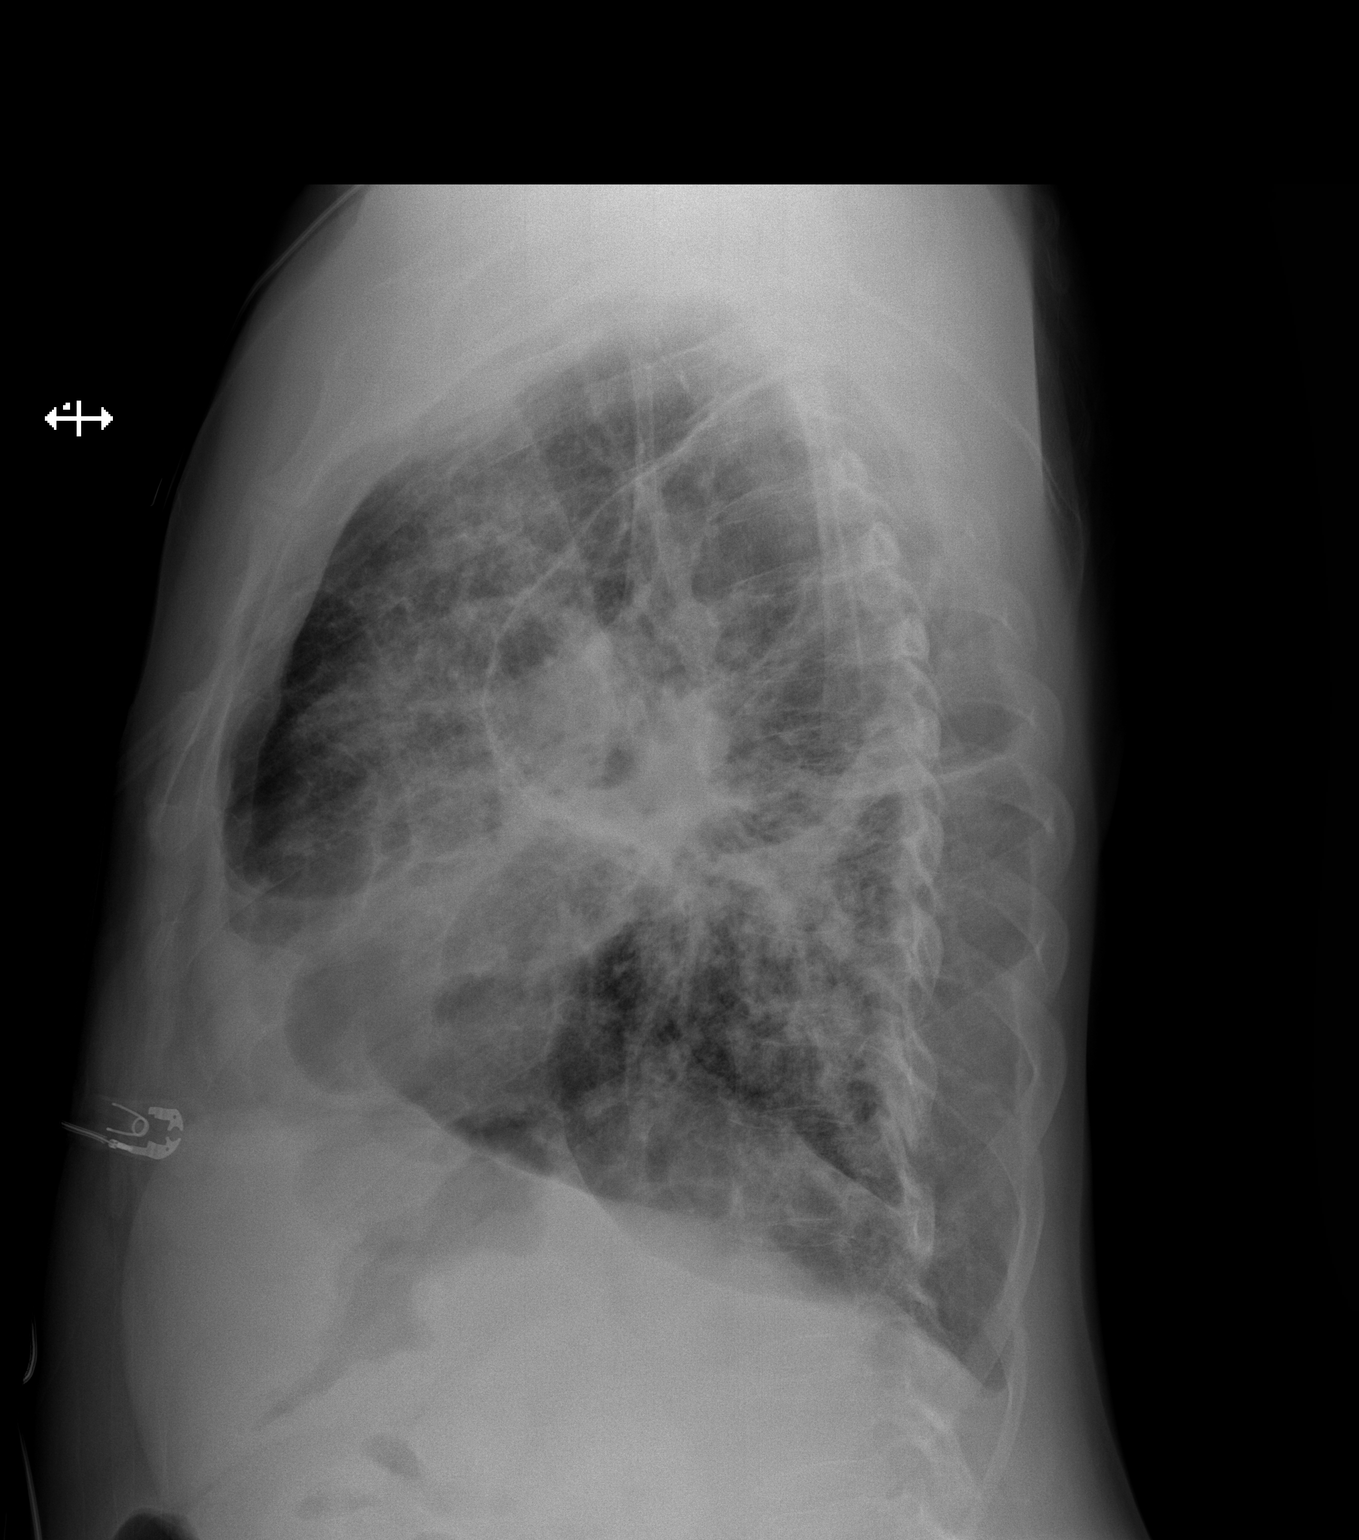

[2 of 2 positions shown; findings below may reference images not displayed]

FINDINGS: The cardiac silhouette, mediastinal and hilar contours are stable.
There is severe chronic lung disease with pulmonary fibrosis.
Findings suspicious for superimposed infiltrate in the left lung. No
pleural effusion.
IMPRESSION: Severe chronic lung disease with pulmonary fibrosis.

Suspect superimposed left lower lobe infiltrate.

## 2015-12-11 ENCOUNTER — Telehealth: Payer: Self-pay | Admitting: Emergency Medicine

## 2015-12-11 ENCOUNTER — Ambulatory Visit (INDEPENDENT_AMBULATORY_CARE_PROVIDER_SITE_OTHER): Payer: Commercial Managed Care - HMO | Admitting: Acute Care

## 2015-12-11 ENCOUNTER — Encounter: Payer: Self-pay | Admitting: Acute Care

## 2015-12-11 DIAGNOSIS — J9611 Chronic respiratory failure with hypoxia: Secondary | ICD-10-CM

## 2015-12-11 DIAGNOSIS — J441 Chronic obstructive pulmonary disease with (acute) exacerbation: Secondary | ICD-10-CM | POA: Diagnosis not present

## 2015-12-11 IMAGING — CT CT CHEST W/O CM
2 of 4 series · 15 of 36 positions shown, 18 images · non-contrast
Comparison: Chest CT 01/22/2014.

CLINICAL DATA: 48-year-old male with history of body aches and
fever, with worsening productive cough and left-sided chest and
flank pain which increases with inspiration. History of sarcoidosis.
History of sleep apnea. Chronic respiratory failure.

EXAM:
CT CHEST WITHOUT CONTRAST
TECHNIQUE: Multidetector CT imaging of the chest was performed following the
standard protocol without IV contrast.

[Series 2: chest w/o st · axial · non-contrast · 0.72mm/px · z∈[+1054,+1334]mm · 12 of 66 slices shown, 15 images]
[im 5/66  mediastinal]
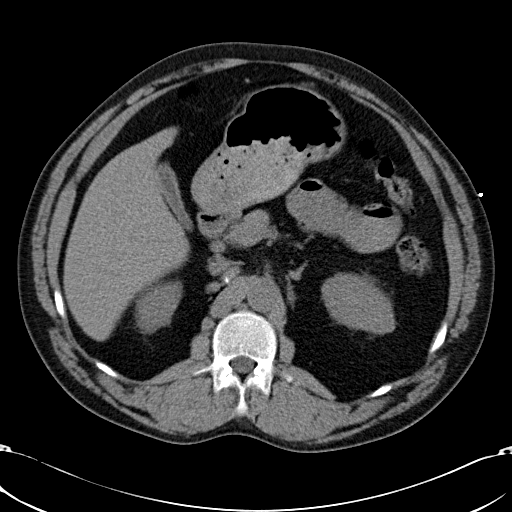
[im 5/66  lung]
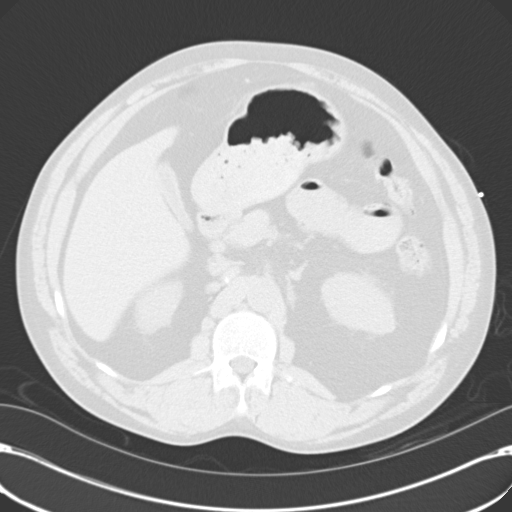
[im 10/66  lung]
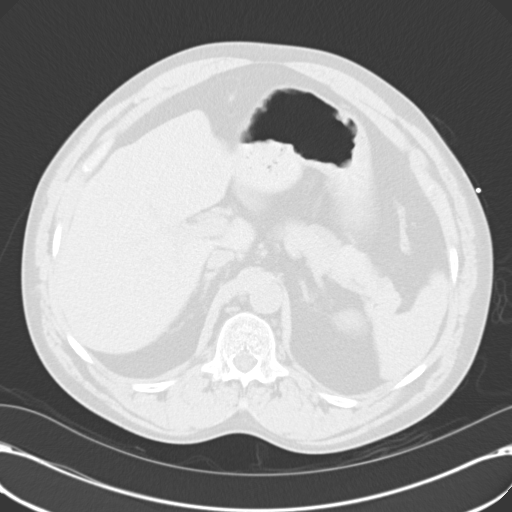
[im 14/66  lung]
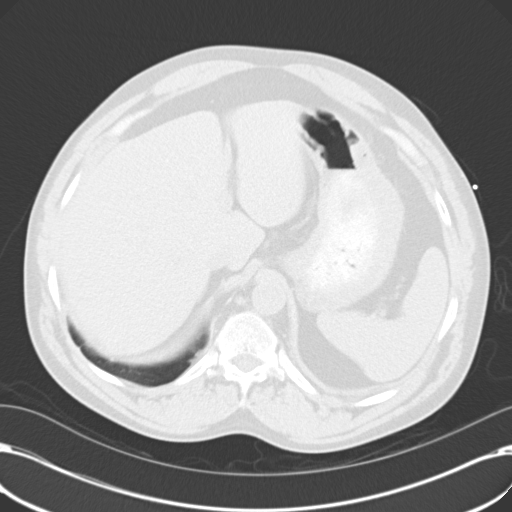
[im 19/66  lung]
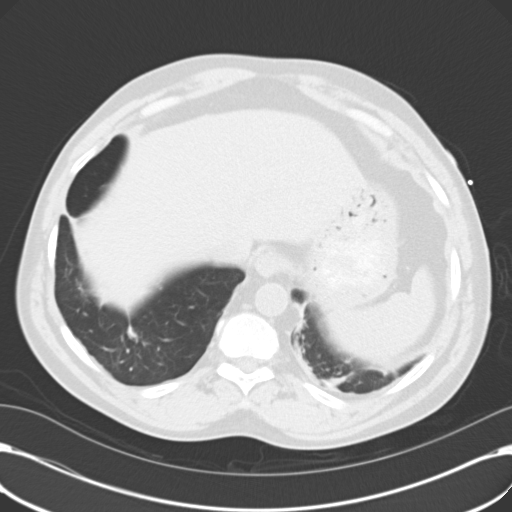
[im 24/66  mediastinal]
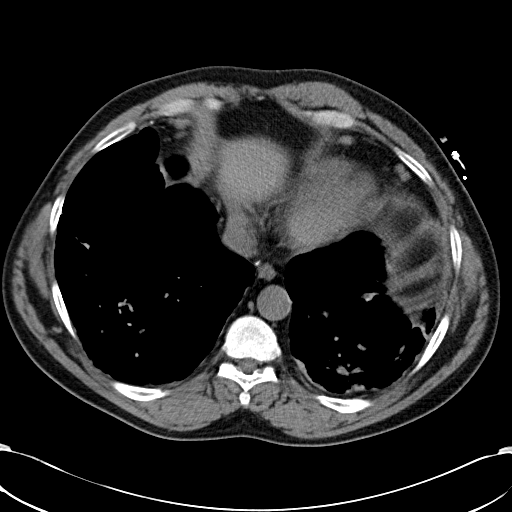
[im 24/66  lung]
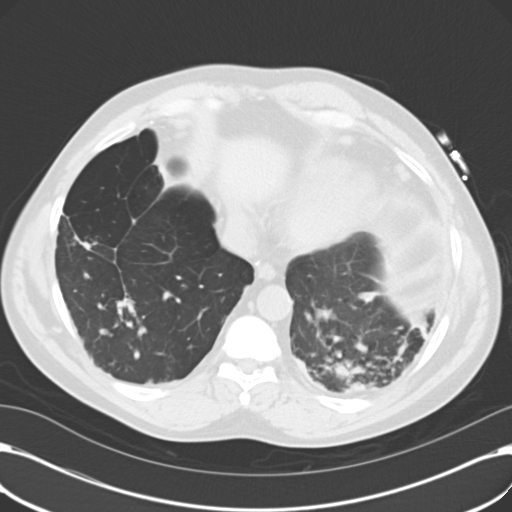
[im 28/66  lung]
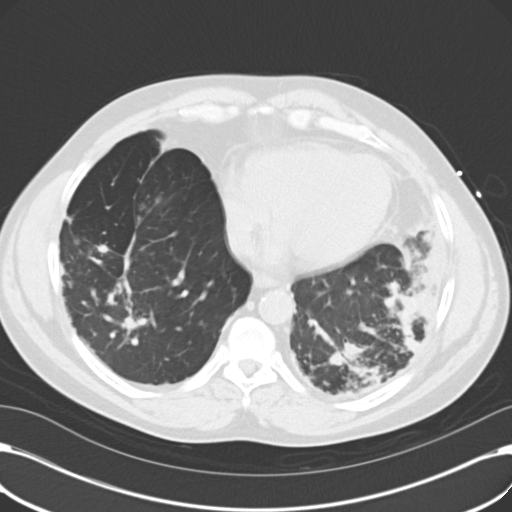
[im 38/66  lung]
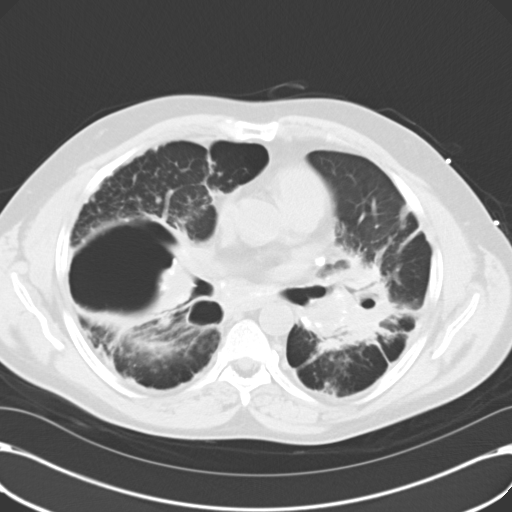
[im 42/66  lung]
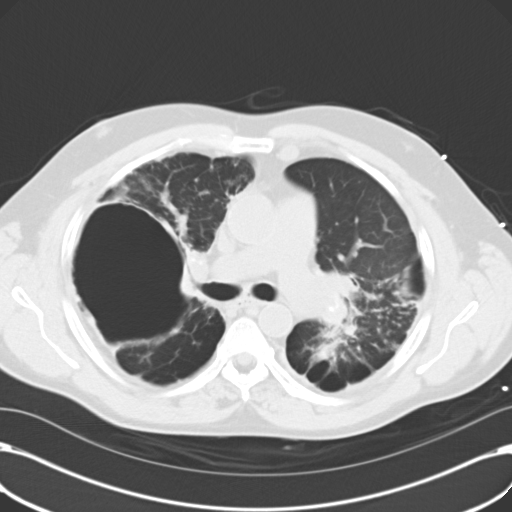
[im 47/66  mediastinal]
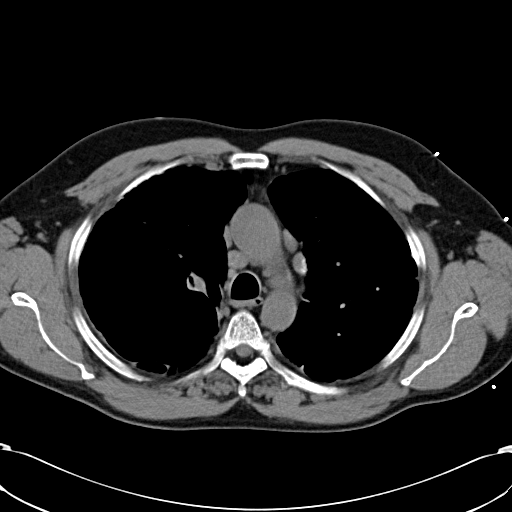
[im 47/66  lung]
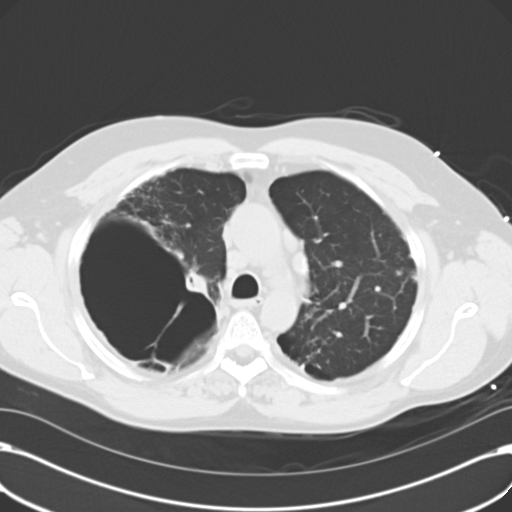
[im 52/66  lung]
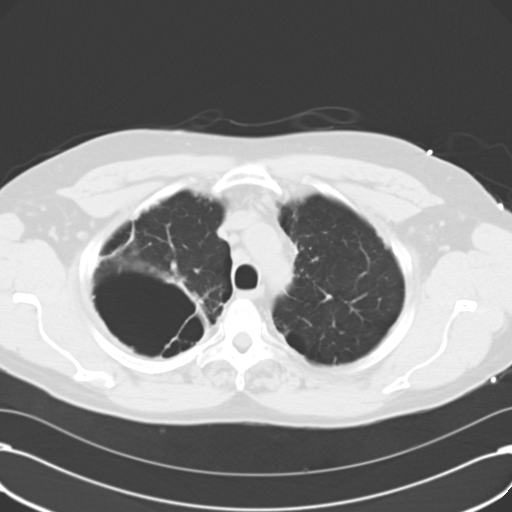
[im 56/66  lung]
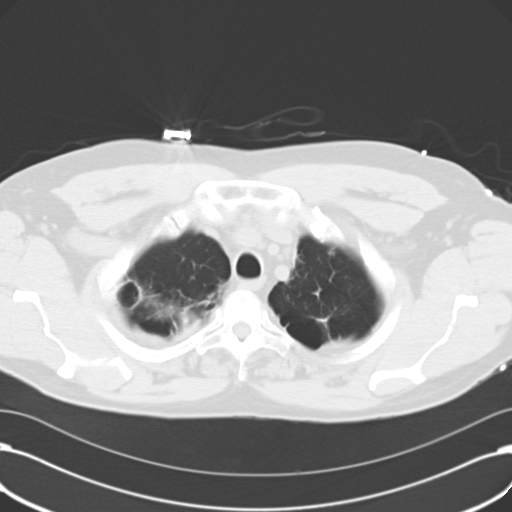
[im 61/66  lung]
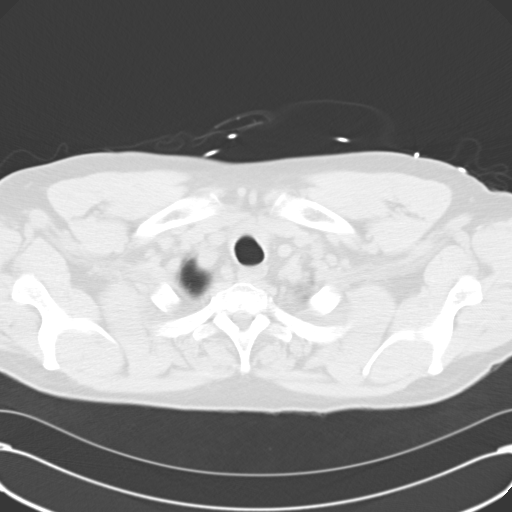

[Series 602: <mpr thick range> · coronal · 0.72mm/px · 3 of 133 slices shown]
[im 27/133  lung]
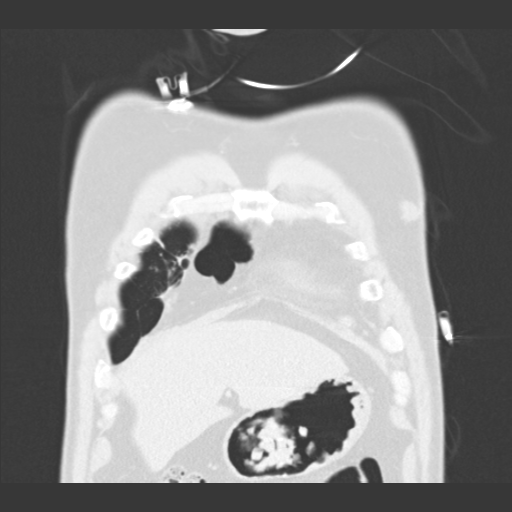
[im 53/133  lung]
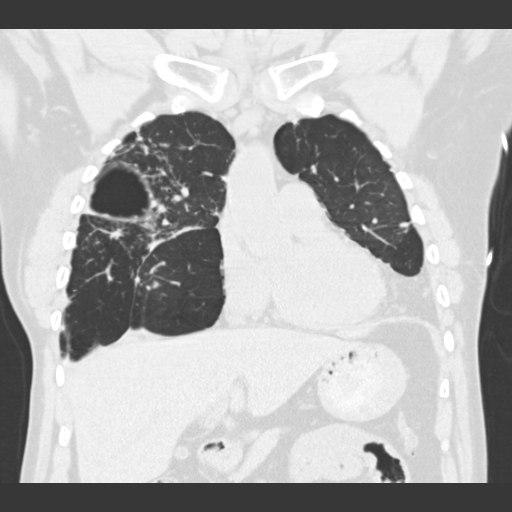
[im 80/133  lung]
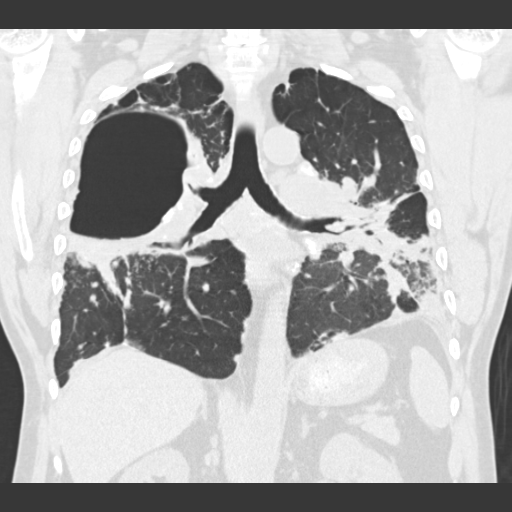

[15 of 36 positions shown; findings below may reference images not displayed]

FINDINGS: Mediastinum/Lymph Nodes: Heart size is normal. Old There is no
significant pericardial fluid, thickening or pericardial
calcification. Innumerable borderline enlarged and mildly enlarged
mediastinal and bilateral hilar lymph nodes are noted, measuring up
to 13 mm in short axis in the low right paratracheal nodal station,
similar to prior study from 01/22/2014. Many of these lymph nodes
are calcified. Esophagus is unremarkable in appearance. No axillary
lymphadenopathy.

Lungs/Pleura: A large bulla is again noted in the right upper lobe,
similar to the prior study. As with numerous prior examinations,
there is extensive nodularity and mass-like consolidation scattered
throughout the lungs bilaterally. This is most evident in the
perihilar regions of the lungs, particularly on the left, and the
nodularity appears to extend both along the peribronchovascular
interstitium, in the subpleural aspect of the lungs, and along the
fissures, characteristic of nodularity associated with sarcoidosis.
This is associated with extensive areas of architectural distortion,
and there is some upward retraction of the hilar structures, as seen
on prior examinations. Chronic elevation of the left hemidiaphragm.
No acute consolidative airspace disease. No pleural effusions.

Upper Abdomen: Unremarkable.

Musculoskeletal/Soft Tissues: There are no aggressive appearing
lytic or blastic lesions noted in the visualized portions of the
skeleton.
IMPRESSION: 1. No acute findings in the thorax to account for the patient's
symptoms.
2. Chronic stigmata of sarcoidosis redemonstrated, very similar to
prior studies, as detailed above.
3. Large chronic bulla in the right upper lobe is unchanged.

## 2015-12-11 MED ORDER — FLUTICASONE-SALMETEROL 250-50 MCG/DOSE IN AEPB: 1.0000 | INHALATION_SPRAY | Freq: Every day | RESPIRATORY_TRACT | 0 refills | Status: DC

## 2015-12-11 MED ORDER — PREDNISONE 10 MG PO TABS: ORAL_TABLET | ORAL | 0 refills | Status: DC

## 2015-12-11 MED ORDER — DOXYCYCLINE HYCLATE 100 MG PO TABS: 100.0000 mg | ORAL_TABLET | Freq: Two times a day (BID) | ORAL | 0 refills | Status: DC

## 2015-12-11 NOTE — Assessment & Plan Note (Signed)
Continuous oxygen as needed Goal oxygen saturations greater than 88%

## 2015-12-11 NOTE — Patient Instructions (Addendum)
We will prescribe doxycycline today 100 mg twice daily x 7 days Prednisone taper; 10 mg tablets: 4 tabs x 2 days, 3 tabs x 2 days, 2 tabs x 2 days 1 tab x 2 days then stop. Continue using your Symbicort 2 puffs twice daily. Rinse mouth after use. Continue your Neb treatments with your Chest Vest three times daily. Continue your Mucinex for chest congestion Continue on CPAP at bedtime. You appear to be benefiting from the treatment Goal is to wear for at least 4-6 hours each night for maximal clinical benefit. Do not drive if sleepy. Add Boost nutritional supplements 1-2 times daily. Follow up in 4 weeks with either myself or Dr. Delton CoombesByrum or NP Please contact office for sooner follow up if symptoms do not improve or worsen or seek emergency care  Will need flu shot at follow up.

## 2015-12-11 NOTE — Telephone Encounter (Signed)
Called and spoke to pt. Pt c/o wheezing and SOB. Pt was seen in ED on 11.13.17 and was given pred taper and states he felt better when taking it but when he completed it he started to have more symptoms again. Appt made with SG today at 12pm. Pt verbalized understanding and denied any further questions or concerns at this time.

## 2015-12-11 NOTE — Progress Notes (Signed)
History of Present Illness [name] Baumannommy Royall is a 50 y.o. male never smoker with Stage IV pulmonary sarcoidosis ,bronchiectasis and chronic respiratory failure with chronic hypoxemia needing home oxygen and sleep apnea needing CPAP. History of polysubstance abuse. He is followed by Dr. Delton CoombesByrum   12/11/2015 Acute OV: Pt. Presents to the office with complaint of cough with thick yellow secretions and increased shortness of breath 3-4 days. He has been wheezing, despite use of his nebulizer treatments. He has been out of Symbicort x 2 weeks. He is dependent on samples, and cannot afford the $40 a month co-pay through his insurance.He was seen in the ED 11/ 13/2017 for a COPD exacerbation and was given prednisone x 4 days and a z pack at that time. He states that he did feel a bit better, but now feels worse. He has a cough productive for yellow secretions, denies fever, no chest pain, but some chest tightness. When I asked him the number of times daily that he was utilizing his nebulizer treatments he just commented as he needs them. Of note he does state that his weight has decreased within the last 2 months. We did discuss that depending on the office for samples of Symbicort was not a good plan as there are times that we do not have samples available for him.   Tests 11/24/2015 Chest x-ray: IMPRESSION: Extensive scarring and fibrosis remain stable. No new opacity. No adenopathy. Stable cardiac silhouette. Changes are consistent with known chronic sarcoidosis.  Past medical hx Past Medical History:  Diagnosis Date  . Bronchitis   . Chronic respiratory failure (HCC)    home O@ 4L (01/28/14)  . COPD (chronic obstructive pulmonary disease) (HCC)   . Hypertension   . Neuromuscular disorder (HCC)   . OSA on CPAP   . Pneumonia   . Sarcoidosis (HCC)      Past surgical hx, Family hx, Social hx all reviewed.  Current Outpatient Prescriptions on File Prior to Visit  Medication Sig  . albuterol  (PROVENTIL) (2.5 MG/3ML) 0.083% nebulizer solution Take 2.5 mg by nebulization every 4 (four) hours as needed for wheezing or shortness of breath (wheezing and shortness of breath).   . bisacodyl (BISACODYL) 5 MG EC tablet Take 1 tablet (5 mg total) by mouth daily as needed for moderate constipation.  . bisoprolol (ZEBETA) 10 MG tablet Take 1 tablet (10 mg total) by mouth daily. (Patient taking differently: Take 10 mg by mouth at bedtime. )  . calcium-vitamin D (OSCAL WITH D) 500-200 MG-UNIT per tablet Take 1 tablet by mouth every morning.  . cyclobenzaprine (FLEXERIL) 10 MG tablet Take 10 mg by mouth 3 (three) times daily as needed for muscle spasms (muscle spasms).   Marland Kitchen. diltiazem (CARDIZEM CD) 120 MG 24 hr capsule Take 1 capsule (120 mg total) by mouth daily.  Marland Kitchen. diltiazem (CARTIA XT) 120 MG 24 hr capsule Take 120 mg by mouth daily.  . diphenhydrAMINE (BENADRYL) 25 MG tablet Take 25 mg by mouth every 6 (six) hours as needed for allergies (allergies).   . Ergocalciferol (VITAMIN D2) 400 units TABS Take 400 Units by mouth daily.  . ferrous sulfate 325 (65 FE) MG tablet Take 1 tablet (325 mg total) by mouth 2 (two) times daily with a meal.  . guaiFENesin (MUCINEX) 600 MG 12 hr tablet Take 1 tablet (600 mg total) by mouth 2 (two) times daily. (Patient taking differently: Take 600 mg by mouth 2 (two) times daily as needed for cough or to  loosen phlegm. )  . magnesium citrate SOLN Take 1 Bottle by mouth daily as needed for mild constipation, moderate constipation or severe constipation.   . Multiple Vitamins-Minerals (MULTIVITAMIN WITH MINERALS) tablet Take 1 tablet by mouth every morning.   Marland Kitchen. omeprazole (PRILOSEC) 40 MG capsule Take 40 mg by mouth daily.  . ondansetron (ZOFRAN) 4 MG tablet Take 4 mg by mouth every 8 (eight) hours as needed for nausea or vomiting.   . polyethylene glycol (MIRALAX / GLYCOLAX) packet Take 17 g by mouth daily. (Patient taking differently: Take 17 g by mouth daily as needed for  mild constipation or moderate constipation. )  . saline (AYR) GEL Place 1 application into the nose every 4 (four) hours as needed. (Patient taking differently: Place 1 application into the nose every 4 (four) hours as needed (dryness/ irritation). )  . VENTOLIN HFA 108 (90 Base) MCG/ACT inhaler INHALE TWO PUFFS BY MOUTH EVERY 6 HOURS AS NEEDED FOR WHEEZING OR SHORTNESS OF BREATH  . budesonide-formoterol (SYMBICORT) 160-4.5 MCG/ACT inhaler Inhale 2 puffs into the lungs 2 (two) times daily.   No current facility-administered medications on file prior to visit.      Allergies  Allergen Reactions  . Clindamycin/Lincomycin Anaphylaxis and Swelling    Made face and tongue swell    Review Of Systems:  Constitutional:   +  weight loss, +night sweats ( With prednisone he got in the ED 11/24/15,  No Fevers, chills, +fatigue, or  lassitude.  HEENT:   No headaches,  Difficulty swallowing,  Tooth/dental problems, or  Sore throat,                No sneezing, itching, ear ache, nasal congestion, post nasal drip,   CV:  No chest pain,  Orthopnea, PND, swelling in lower extremities, anasarca, dizziness, palpitations, syncope.   GI  No heartburn, indigestion, abdominal pain, nausea, vomiting, diarrhea, change in bowel habits, loss of appetite, bloody stools.   Resp: + shortness of breath with exertion less at rest.  + excess mucus, + productive cough,  No non-productive cough,  No coughing up of blood.  + change in color of mucus.  + wheezing.  No chest wall deformity  Skin: no rash or lesions.  GU: no dysuria, change in color of urine, no urgency or frequency.  No flank pain, no hematuria   MS:  No joint pain or swelling.  No decreased range of motion.  No back pain.  Psych:  No change in mood or affect. No depression or anxiety.  No memory loss.   Vital Signs BP 122/80 (BP Location: Left Arm, Cuff Size: Normal)   Pulse (!) 116   Ht 5' 7.5" (1.715 m)   Wt 148 lb 3.2 oz (67.2 kg)   SpO2 100%    BMI 22.87 kg/m    Physical Exam:  General- No distress,  A&Ox3, debilitated appearing male wearing nasal oxygen ENT: No sinus tenderness, TM clear, pale nasal mucosa, no oral exudate,no post nasal drip, no LAN Cardiac: S1, S2, regular rate and rhythm, no murmur Chest: + wheeze/ no rales/ breath sounds diminished per bases bilaterally; no accessory muscle use, no nasal flaring, no sternal retractions Abd.: Soft Non-tender Ext: No clubbing cyanosis, edema Neuro:  normal strength Skin: No rashes, warm and dry Psych: normal mood and behavior   Assessment/Plan  COPD exacerbation (HCC) Slow to resolve COPD exacerbation Seen in the emergency room 11/24/2015 and given short Pred burst and Z-Pak Chest x-ray 11/24/2015 indicated no  pneumonia Plan: We will prescribe doxycycline today 100 mg twice daily x 7 days Prednisone taper; 10 mg tablets: 4 tabs x 2 days, 3 tabs x 2 days, 2 tabs x 2 days 1 tab x 2 days then stop. Continue using your Symbicort 2 puffs twice daily. Rinse mouth after use. Continue your Neb treatments with your Chest Vest three times daily. Continue your Mucinex for chest congestion Continue on CPAP at bedtime. You appear to be benefiting from the treatment Goal is to wear for at least 4-6 hours each night for maximal clinical benefit. Do not drive if sleepy. Add Boost nutritional supplements 1-2 times daily. Follow up in 4 weeks with either myself or Dr. Delton Coombes or NP Please contact office for sooner follow up if symptoms do not improve or worsen or seek emergency care  Will need flu shot at follow up.   Chronic respiratory failure with hypoxia (HCC) Continuous oxygen as needed Goal oxygen saturations greater than 88%  The office was out of Symbicort samples today. In the interim we have sent Mr. Lindblad home with Advair discus samples that will last for 2 weeks until he returns for follow-up so that he will have maintenance medication while recovering from this  slow to resolve exacerbation  Bevelyn Ngo, NP 12/11/2015  3:27 PM

## 2015-12-11 NOTE — Assessment & Plan Note (Signed)
Slow to resolve COPD exacerbation Seen in the emergency room 11/24/2015 and given short Pred burst and Z-Pak Chest x-ray 11/24/2015 indicated no pneumonia Plan: We will prescribe doxycycline today 100 mg twice daily x 7 days Prednisone taper; 10 mg tablets: 4 tabs x 2 days, 3 tabs x 2 days, 2 tabs x 2 days 1 tab x 2 days then stop. Continue using your Symbicort 2 puffs twice daily. Rinse mouth after use. Continue your Neb treatments with your Chest Vest three times daily. Continue your Mucinex for chest congestion Continue on CPAP at bedtime. You appear to be benefiting from the treatment Goal is to wear for at least 4-6 hours each night for maximal clinical benefit. Do not drive if sleepy. Add Boost nutritional supplements 1-2 times daily. Follow up in 4 weeks with either myself or Dr. Delton CoombesByrum or NP Please contact office for sooner follow up if symptoms do not improve or worsen or seek emergency care  Will need flu shot at follow up.

## 2015-12-13 IMAGING — CR DG CHEST 2V
2 series · 2 of 2 positions shown · non-contrast
Comparison: 09/30/2014.

CLINICAL DATA: Pneumonia.  Cough and chest pain.

EXAM:
CHEST  2 VIEW

[w chest pa]
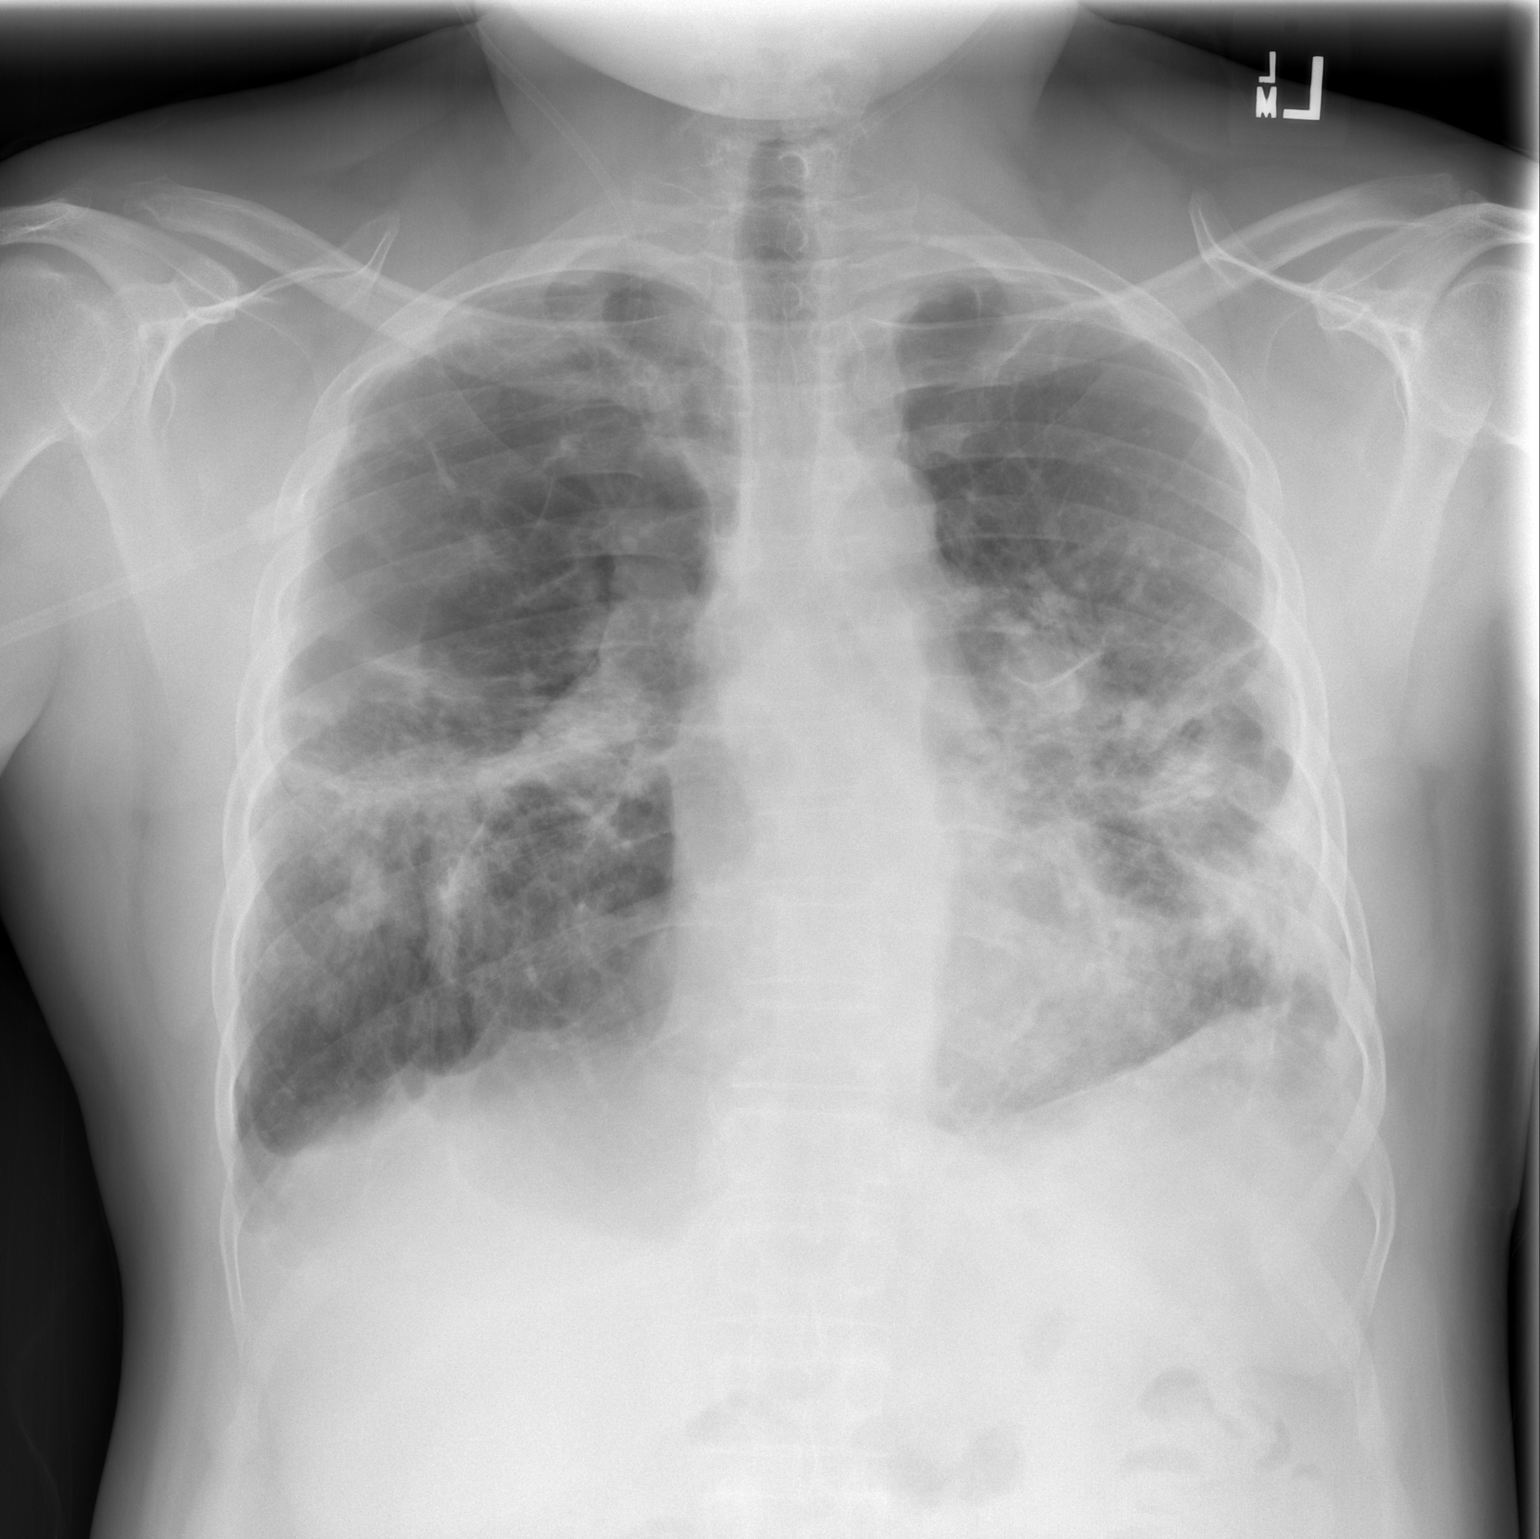

[w chest lat]
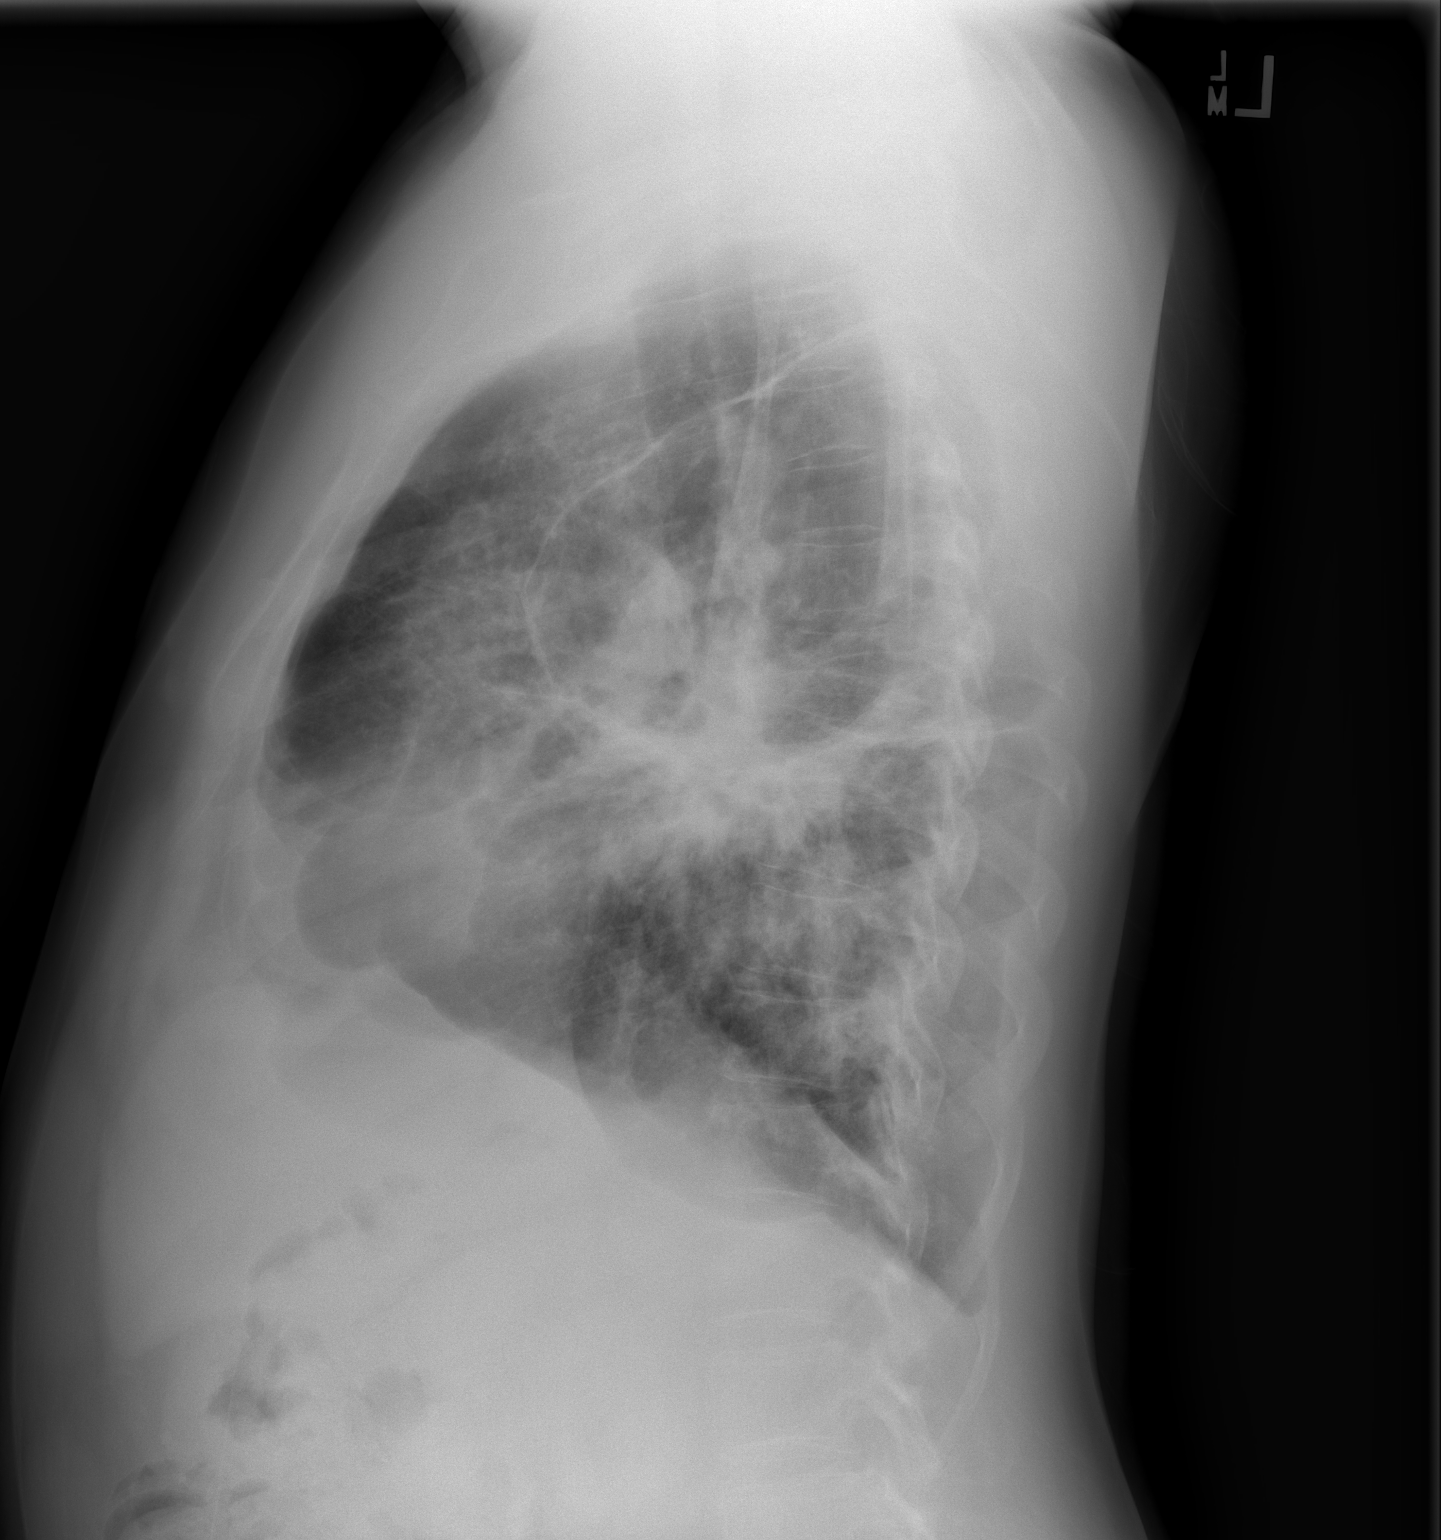

[2 of 2 positions shown; findings below may reference images not displayed]

FINDINGS: Heart size appears normal. There is no pleural effusion or edema
identified. Chronic changes of sarcoid is again re- demonstrated
including bilateral areas of architectural distortion, scarring,
nodularity and consolidation. No superimposed airspace opacities
identified.
IMPRESSION: 1. No change from previous exam and no specific findings to to
account for the patient's acute symptoms.

## 2016-01-02 ENCOUNTER — Other Ambulatory Visit: Payer: Self-pay | Admitting: Emergency Medicine

## 2016-01-12 ENCOUNTER — Emergency Department (HOSPITAL_COMMUNITY): Payer: Commercial Managed Care - HMO

## 2016-01-12 ENCOUNTER — Inpatient Hospital Stay (HOSPITAL_COMMUNITY)
Admission: EM | Admit: 2016-01-12 | Discharge: 2016-01-17 | DRG: 193 | Disposition: A | Discharge status: Home / Self Care | Payer: Commercial Managed Care - HMO | Attending: Internal Medicine | Admitting: Internal Medicine

## 2016-01-12 ENCOUNTER — Encounter (HOSPITAL_COMMUNITY): Payer: Self-pay | Admitting: Emergency Medicine

## 2016-01-12 DIAGNOSIS — J9601 Acute respiratory failure with hypoxia: Secondary | ICD-10-CM | POA: Diagnosis not present

## 2016-01-12 DIAGNOSIS — J479 Bronchiectasis, uncomplicated: Secondary | ICD-10-CM | POA: Diagnosis not present

## 2016-01-12 DIAGNOSIS — R0602 Shortness of breath: Secondary | ICD-10-CM | POA: Diagnosis present

## 2016-01-12 DIAGNOSIS — B9710 Unspecified enterovirus as the cause of diseases classified elsewhere: Secondary | ICD-10-CM | POA: Diagnosis present

## 2016-01-12 DIAGNOSIS — I110 Hypertensive heart disease with heart failure: Secondary | ICD-10-CM | POA: Diagnosis present

## 2016-01-12 DIAGNOSIS — G4733 Obstructive sleep apnea (adult) (pediatric): Secondary | ICD-10-CM

## 2016-01-12 DIAGNOSIS — J9621 Acute and chronic respiratory failure with hypoxia: Secondary | ICD-10-CM | POA: Diagnosis present

## 2016-01-12 DIAGNOSIS — R000 Tachycardia, unspecified: Secondary | ICD-10-CM

## 2016-01-12 DIAGNOSIS — J471 Bronchiectasis with (acute) exacerbation: Secondary | ICD-10-CM

## 2016-01-12 DIAGNOSIS — F1410 Cocaine abuse, uncomplicated: Secondary | ICD-10-CM

## 2016-01-12 DIAGNOSIS — Z9989 Dependence on other enabling machines and devices: Secondary | ICD-10-CM

## 2016-01-12 DIAGNOSIS — R51 Headache: Secondary | ICD-10-CM | POA: Diagnosis present

## 2016-01-12 DIAGNOSIS — J9611 Chronic respiratory failure with hypoxia: Secondary | ICD-10-CM

## 2016-01-12 DIAGNOSIS — Z881 Allergy status to other antibiotic agents status: Secondary | ICD-10-CM

## 2016-01-12 DIAGNOSIS — B9789 Other viral agents as the cause of diseases classified elsewhere: Secondary | ICD-10-CM | POA: Diagnosis present

## 2016-01-12 DIAGNOSIS — Y95 Nosocomial condition: Secondary | ICD-10-CM | POA: Diagnosis present

## 2016-01-12 DIAGNOSIS — Z833 Family history of diabetes mellitus: Secondary | ICD-10-CM

## 2016-01-12 DIAGNOSIS — J440 Chronic obstructive pulmonary disease with acute lower respiratory infection: Secondary | ICD-10-CM | POA: Diagnosis present

## 2016-01-12 DIAGNOSIS — D860 Sarcoidosis of lung: Secondary | ICD-10-CM

## 2016-01-12 DIAGNOSIS — I10 Essential (primary) hypertension: Secondary | ICD-10-CM

## 2016-01-12 DIAGNOSIS — R0603 Acute respiratory distress: Secondary | ICD-10-CM | POA: Diagnosis present

## 2016-01-12 DIAGNOSIS — Z9981 Dependence on supplemental oxygen: Secondary | ICD-10-CM

## 2016-01-12 DIAGNOSIS — J1289 Other viral pneumonia: Secondary | ICD-10-CM | POA: Diagnosis present

## 2016-01-12 DIAGNOSIS — Z23 Encounter for immunization: Secondary | ICD-10-CM | POA: Diagnosis not present

## 2016-01-12 DIAGNOSIS — J9622 Acute and chronic respiratory failure with hypercapnia: Secondary | ICD-10-CM | POA: Diagnosis not present

## 2016-01-12 DIAGNOSIS — I509 Heart failure, unspecified: Secondary | ICD-10-CM | POA: Diagnosis present

## 2016-01-12 DIAGNOSIS — J9610 Chronic respiratory failure, unspecified whether with hypoxia or hypercapnia: Secondary | ICD-10-CM

## 2016-01-12 DIAGNOSIS — J189 Pneumonia, unspecified organism: Secondary | ICD-10-CM

## 2016-01-12 DIAGNOSIS — B348 Other viral infections of unspecified site: Secondary | ICD-10-CM | POA: Diagnosis not present

## 2016-01-12 HISTORY — DX: Heart failure, unspecified: I50.9

## 2016-01-12 LAB — RAPID URINE DRUG SCREEN, HOSP PERFORMED
Amphetamines: NOT DETECTED
BARBITURATES: NOT DETECTED
BENZODIAZEPINES: NOT DETECTED
Cocaine: POSITIVE — AB
Opiates: NOT DETECTED
TETRAHYDROCANNABINOL: NOT DETECTED

## 2016-01-12 LAB — CBC WITH DIFFERENTIAL/PLATELET
BASOS PCT: 0 %
Basophils Absolute: 0 10*3/uL (ref 0.0–0.1)
EOS PCT: 6 %
Eosinophils Absolute: 0.4 10*3/uL (ref 0.0–0.7)
HEMATOCRIT: 44.7 % (ref 39.0–52.0)
Hemoglobin: 14.6 g/dL (ref 13.0–17.0)
Lymphocytes Relative: 34 %
Lymphs Abs: 2.3 10*3/uL (ref 0.7–4.0)
MCH: 26.7 pg (ref 26.0–34.0)
MCHC: 32.7 g/dL (ref 30.0–36.0)
MCV: 81.9 fL (ref 78.0–100.0)
MONO ABS: 1 10*3/uL (ref 0.1–1.0)
MONOS PCT: 15 %
Neutro Abs: 3.1 10*3/uL (ref 1.7–7.7)
Neutrophils Relative %: 45 %
PLATELETS: 252 10*3/uL (ref 150–400)
RBC: 5.46 MIL/uL (ref 4.22–5.81)
RDW: 12.5 % (ref 11.5–15.5)
WBC: 6.9 10*3/uL (ref 4.0–10.5)

## 2016-01-12 LAB — I-STAT ARTERIAL BLOOD GAS, ED
Acid-Base Excess: 5 mmol/L — ABNORMAL HIGH (ref 0.0–2.0)
Bicarbonate: 33.4 mmol/L — ABNORMAL HIGH (ref 20.0–28.0)
O2 Saturation: 99 %
TCO2: 35 mmol/L (ref 0–100)
pCO2 arterial: 64.2 mmHg — ABNORMAL HIGH (ref 32.0–48.0)
pH, Arterial: 7.325 — ABNORMAL LOW (ref 7.350–7.450)
pO2, Arterial: 164 mmHg — ABNORMAL HIGH (ref 83.0–108.0)

## 2016-01-12 LAB — COMPREHENSIVE METABOLIC PANEL
ALBUMIN: 3.8 g/dL (ref 3.5–5.0)
ALT: 21 U/L (ref 17–63)
ANION GAP: 11 (ref 5–15)
AST: 36 U/L (ref 15–41)
Alkaline Phosphatase: 101 U/L (ref 38–126)
BUN: 12 mg/dL (ref 6–20)
CO2: 30 mmol/L (ref 22–32)
Calcium: 9.9 mg/dL (ref 8.9–10.3)
Chloride: 102 mmol/L (ref 101–111)
Creatinine, Ser: 1.04 mg/dL (ref 0.61–1.24)
GFR calc Af Amer: >60 mL/min (ref >60)
Glucose, Bld: 108 mg/dL — ABNORMAL HIGH (ref 65–99)
Potassium: 4.2 mmol/L (ref 3.5–5.1)
Sodium: 143 mmol/L (ref 135–145)
TOTAL PROTEIN: 7.8 g/dL (ref 6.5–8.1)
Total Bilirubin: 0.4 mg/dL (ref 0.3–1.2)

## 2016-01-12 LAB — I-STAT CG4 LACTIC ACID, ED
LACTIC ACID, VENOUS: 2.1 mmol/L — AB (ref 0.5–1.9)
Lactic Acid, Venous: 2.19 mmol/L (ref 0.5–1.9)

## 2016-01-12 LAB — PROCALCITONIN: Procalcitonin: <0.1 ng/mL

## 2016-01-12 LAB — BRAIN NATRIURETIC PEPTIDE: B NATRIURETIC PEPTIDE 5: 14.8 pg/mL (ref 0.0–100.0)

## 2016-01-12 LAB — I-STAT TROPONIN, ED: Troponin i, poc: 0 ng/mL (ref 0.00–0.08)

## 2016-01-12 LAB — INFLUENZA PANEL BY PCR (TYPE A & B)
INFLAPCR: NEGATIVE
INFLBPCR: NEGATIVE

## 2016-01-12 LAB — MRSA PCR SCREENING: MRSA BY PCR: NEGATIVE

## 2016-01-12 MED ORDER — CEFEPIME HCL 2 G IJ SOLR
2.0000 g | Freq: Once | INTRAMUSCULAR | Status: AC
  Administered 2016-01-12: 2 g via INTRAVENOUS
  Filled 2016-01-12: qty 2

## 2016-01-12 MED ORDER — PREDNISONE 20 MG PO TABS
40.0000 mg | ORAL_TABLET | Freq: Every day | ORAL | Status: DC
  Administered 2016-01-13 – 2016-01-17 (×5): 40 mg via ORAL
  Filled 2016-01-12 (×5): qty 2

## 2016-01-12 MED ORDER — DEXTROSE 5 % IV SOLN
2.0000 g | Freq: Two times a day (BID) | INTRAVENOUS | Status: DC
  Filled 2016-01-12: qty 2

## 2016-01-12 MED ORDER — LEVOFLOXACIN IN D5W 750 MG/150ML IV SOLN
750.0000 mg | INTRAVENOUS | Status: DC
  Administered 2016-01-13 – 2016-01-14 (×2): 750 mg via INTRAVENOUS
  Filled 2016-01-12 (×3): qty 150

## 2016-01-12 MED ORDER — VANCOMYCIN HCL IN DEXTROSE 750-5 MG/150ML-% IV SOLN
750.0000 mg | Freq: Two times a day (BID) | INTRAVENOUS | Status: DC
Start: 2016-01-13 — End: 2016-01-12
  Filled 2016-01-12: qty 150

## 2016-01-12 MED ORDER — ACETAMINOPHEN 650 MG RE SUPP: 650.0000 mg | Freq: Four times a day (QID) | RECTAL | Status: DC | PRN

## 2016-01-12 MED ORDER — ALBUTEROL SULFATE (2.5 MG/3ML) 0.083% IN NEBU: 2.5000 mg | INHALATION_SOLUTION | RESPIRATORY_TRACT | Status: DC | PRN

## 2016-01-12 MED ORDER — GUAIFENESIN ER 600 MG PO TB12
600.0000 mg | ORAL_TABLET | Freq: Two times a day (BID) | ORAL | Status: DC | PRN
  Administered 2016-01-13 – 2016-01-15 (×4): 600 mg via ORAL
  Filled 2016-01-12 (×4): qty 1

## 2016-01-12 MED ORDER — ALBUTEROL SULFATE (2.5 MG/3ML) 0.083% IN NEBU: 2.5000 mg | INHALATION_SOLUTION | RESPIRATORY_TRACT | Status: DC

## 2016-01-12 MED ORDER — NITROGLYCERIN 0.4 MG SL SUBL
0.4000 mg | SUBLINGUAL_TABLET | Freq: Once | SUBLINGUAL | Status: AC
  Administered 2016-01-12: 0.4 mg via SUBLINGUAL
  Filled 2016-01-12: qty 1

## 2016-01-12 MED ORDER — METHYLPREDNISOLONE SODIUM SUCC 125 MG IJ SOLR
125.0000 mg | Freq: Once | INTRAMUSCULAR | Status: AC
  Administered 2016-01-12: 125 mg via INTRAVENOUS
  Filled 2016-01-12: qty 2

## 2016-01-12 MED ORDER — FUROSEMIDE 10 MG/ML IJ SOLN
40.0000 mg | Freq: Once | INTRAMUSCULAR | Status: AC
  Administered 2016-01-12: 40 mg via INTRAVENOUS
  Filled 2016-01-12: qty 4

## 2016-01-12 MED ORDER — ENOXAPARIN SODIUM 40 MG/0.4ML ~~LOC~~ SOLN
40.0000 mg | SUBCUTANEOUS | Status: DC
  Administered 2016-01-12 – 2016-01-16 (×5): 40 mg via SUBCUTANEOUS
  Filled 2016-01-12 (×5): qty 0.4

## 2016-01-12 MED ORDER — DILTIAZEM HCL ER COATED BEADS 120 MG PO CP24
120.0000 mg | ORAL_CAPSULE | Freq: Every day | ORAL | Status: DC
  Administered 2016-01-12 – 2016-01-16 (×5): 120 mg via ORAL
  Filled 2016-01-12 (×5): qty 1

## 2016-01-12 MED ORDER — ALBUTEROL SULFATE (2.5 MG/3ML) 0.083% IN NEBU: 5.0000 mg | INHALATION_SOLUTION | Freq: Once | RESPIRATORY_TRACT | Status: DC

## 2016-01-12 MED ORDER — HYDRALAZINE HCL 20 MG/ML IJ SOLN
5.0000 mg | Freq: Once | INTRAMUSCULAR | Status: AC
  Administered 2016-01-13: 5 mg via INTRAVENOUS
  Filled 2016-01-12: qty 1

## 2016-01-12 MED ORDER — IPRATROPIUM-ALBUTEROL 0.5-2.5 (3) MG/3ML IN SOLN
3.0000 mL | Freq: Four times a day (QID) | RESPIRATORY_TRACT | Status: DC
  Administered 2016-01-13: 3 mL via RESPIRATORY_TRACT
  Filled 2016-01-12: qty 3

## 2016-01-12 MED ORDER — ALBUTEROL SULFATE (2.5 MG/3ML) 0.083% IN NEBU
INHALATION_SOLUTION | RESPIRATORY_TRACT | Status: AC
  Administered 2016-01-12: 5 mg via RESPIRATORY_TRACT
  Filled 2016-01-12: qty 3

## 2016-01-12 MED ORDER — SODIUM CHLORIDE 0.9% FLUSH
3.0000 mL | Freq: Two times a day (BID) | INTRAVENOUS | Status: DC
  Administered 2016-01-12 – 2016-01-16 (×7): 3 mL via INTRAVENOUS

## 2016-01-12 MED ORDER — VANCOMYCIN HCL 500 MG IV SOLR
500.0000 mg | Freq: Once | INTRAVENOUS | Status: AC
  Administered 2016-01-12: 500 mg via INTRAVENOUS
  Filled 2016-01-12: qty 500

## 2016-01-12 MED ORDER — INFLUENZA VAC SPLIT QUAD 0.5 ML IM SUSY
0.5000 mL | PREFILLED_SYRINGE | INTRAMUSCULAR | Status: AC
  Administered 2016-01-13: 0.5 mL via INTRAMUSCULAR
  Filled 2016-01-12: qty 0.5

## 2016-01-12 MED ORDER — NITROGLYCERIN 0.4 MG SL SUBL
0.4000 mg | SUBLINGUAL_TABLET | SUBLINGUAL | Status: DC | PRN
  Administered 2016-01-12 (×2): 0.4 mg via SUBLINGUAL
  Filled 2016-01-12: qty 1

## 2016-01-12 MED ORDER — PNEUMOCOCCAL VAC POLYVALENT 25 MCG/0.5ML IJ INJ
0.5000 mL | INJECTION | INTRAMUSCULAR | Status: AC
  Administered 2016-01-13: 0.5 mL via INTRAMUSCULAR
  Filled 2016-01-12: qty 0.5

## 2016-01-12 MED ORDER — ACETAMINOPHEN 325 MG PO TABS: 650.0000 mg | ORAL_TABLET | Freq: Four times a day (QID) | ORAL | Status: DC | PRN

## 2016-01-12 MED ORDER — VANCOMYCIN HCL IN DEXTROSE 1-5 GM/200ML-% IV SOLN
1000.0000 mg | Freq: Once | INTRAVENOUS | Status: AC
  Administered 2016-01-12: 1000 mg via INTRAVENOUS
  Filled 2016-01-12: qty 200

## 2016-01-12 NOTE — Progress Notes (Signed)
Pharmacy Antibiotic Note  [name] is a 51 y.o. male admitted on 01/12/2016 with SOB and suspected pneumonia. Patient with PMH of CHF and COPD. Pharmacy has been consulted for vancomycin and cefepime dosing.  Per pt's wife, weights ~150 lbs and is ~66 inches tall. CrCl ~75-80 ml/min. LA elevated 2.1, WBC 6.9, satting well on Bipap -  50% FiO2.    Pt rec'd vancomycin 1000 mg IV in ED.   Plan: Vancomycin 500 mg IV x 1 to make up a 1500 mg loading dose Vancomycin 750 mg q12h thereafter (goal trough = 15-20) Vanc troughs as needed Cefepime 2g q12h  Follow up weight, height  Follow up culture data as available, LOT Monitor clinical picture, Tmax, CBC     No data recorded.   Recent Labs Lab 01/12/16 1553 01/12/16 1643  WBC 6.9  --   LATICACIDVEN  --  2.10*    CrCl cannot be calculated (No order found.).    Allergies  Allergen Reactions  . Clindamycin/Lincomycin Swelling    Facial and tongue swelling    Antimicrobials this admission: vancomycin 1/1 >>  cefepime 1/1 >>   Dose adjustments this admission: na  Microbiology results: 1/1 BCx: ordered   Thank you for allowing pharmacy to be a part of this patient's care.  Allena Katzaroline E Welles, Pharm.D. PGY1 Pharmacy Resident 1/1/20184:47 PM Pager [phone]

## 2016-01-12 NOTE — H&P (Signed)
Date: 01/12/2016               Patient Name:  [name] MRN: [id number]  DOB: 06/10/1965 Age / Sex: 51 y.o., male   [facility]         Medical Service: Internal Medicine Teaching Service         Attending Physician: Dr. Earl LagosNischal Narendra, MD    First Contact: Dr. Vincente LibertyMolt Pager: [phone]  Second Contact: Dr. Johnny BridgeSaraiya Pager: [phone]       After Hours (After 5p/  First Contact Pager: [phone]  weekends / holidays): Second Contact Pager: [phone]   Chief Complaint: Shortness of breath  History of Present Illness:  [name] is a 51yo male with PMH of Stage 4 Sarcoidosis, COPD, Bronchiectasis, OSA,  HTN, GERD and h/o intubation due to pneumothorax s/p VATs, [facility]. Patient states that he has had progressive symptoms since November that seemed to have slightly improved after an ED visit and Pulmonology visit at which he received prednisone tapers and antibiotics. Patient states he was never completely back to baseline and has since gotten worse. He is supposed to be on Symbicort but ran out a couple of weeks ago (he relies on [facility]. He ran out of his albuterol yesterday. He is having increasing shortness of breath that is worse with lying down, worsening productive cough (initially brown, and more recently white sputum), and his activity is becoming more limited. He endorses left sided, lower rib and "lung" pain when his breathing is acutely worse. He endorses compliance with his home continuous O2 therapy (3-4L) and CPAP. He endorses intermittent diaphoresis. He denies fevers, chills, myalgias, sick contacts, dizziness, falls, syncope.   Evaluated by EMS at home and found to be in respiratory distress and unable to speak in full sentences. He was given albuterol, mag, and solu-medrol with improvement in symptoms. In the ED, he was placed on BiPAP and [facility].   Meds:  Current Meds    Medication Sig  . albuterol (PROVENTIL HFA;VENTOLIN HFA) 108 (90 Base) MCG/ACT inhaler Inhale 2 puffs into the lungs every 6 (six) hours as needed for wheezing or shortness of breath.  Marland Kitchen. albuterol (PROVENTIL) (2.5 MG/3ML) 0.083% nebulizer solution Take 2.5 mg by nebulization every 6 (six) hours as needed for wheezing or shortness of breath.  . bisacodyl (DULCOLAX) 5 MG EC tablet Take 5 mg by mouth daily as needed for moderate constipation.  . bisoprolol (ZEBETA) 10 MG tablet Take 10 mg by mouth at bedtime.  . calcium-vitamin D (OSCAL WITH D) 500-200 MG-UNIT tablet Take 1 tablet by mouth daily.  . Cholecalciferol (VITAMIN D PO) Take 1 tablet by mouth daily.  . cyclobenzaprine (FLEXERIL) 10 MG tablet Take 10 mg by mouth 3 (three) times daily as needed for muscle spasms.  Marland Kitchen. diltiazem (CARTIA XT) 120 MG 24 hr capsule Take 120 mg by mouth at bedtime.  . diphenhydrAMINE (BENADRYL) 25 MG tablet Take 25 mg by mouth every 6 (six) hours as needed for itching or allergies.  . ferrous sulfate 325 (65 FE) MG tablet Take 325 mg by mouth 2 (two) times daily with a meal.  . guaiFENesin (MUCINEX) 600 MG 12 hr tablet Take 600 mg by mouth 2 (two) times daily as needed for cough or to loosen phlegm.  . Multiple Vitamin (MULTIVITAMIN WITH MINERALS) TABS tablet Take 1 tablet by mouth daily.  Marland Kitchen. omeprazole (  PRILOSEC) 40 MG capsule Take 40 mg by mouth 2 (two) times daily.  . ondansetron (ZOFRAN) 4 MG tablet Take 4 mg by mouth every 8 (eight) hours as needed for nausea or vomiting.  . OXYGEN Inhale 4 L into the lungs continuous.  . polyethylene glycol (MIRALAX / GLYCOLAX) packet Take 17 g by mouth daily as needed (constipation).  Marland Kitchen PRESCRIPTION MEDICATION Inhale into the lungs at bedtime.  . saline (AYR) GEL Place 1 application into the nose every 4 (four) hours as needed (dryness/irritation).     Allergies: Allergies as of 01/12/2016 - Review Complete 01/12/2016  Allergen Reaction Noted  . Clindamycin/lincomycin  Swelling 01/12/2016   Past Medical History:  Diagnosis Date  . CHF (congestive heart failure) (HCC)   . Chronic respiratory failure (HCC)   . COPD (chronic obstructive pulmonary disease) (HCC)   . Hypertension   . Neuromuscular disorder (HCC)   . Pneumonia   . Sarcoidosis (HCC)     Family History: Father with DM  Social History: Patient denies past or current smoking. Social alcohol use. Patient denies illicit drug use.  Review of Systems: A complete ROS was negative except as per HPI.   Physical Exam: Blood pressure (!) 138/102, pulse (!) 144, temperature 98.9 F (37.2 C), temperature source Axillary, resp. rate (!) 22, height 5\' 7"  (1.702 m), weight 162 lb 0.6 oz (73.5 kg), SpO2 100 %. General: alert, well-developed, and cooperative to examination.  Head: normocephalic and atraumatic. Pale Eyes: vision grossly intact, no injection and anicteric.  Neck: supple, full ROM, no thyromegaly, no JVD.  Lungs: mild increase in work of breathing, prolonged expirations through pursed lips, patient able to speak several words at a time; anterior wheezing; decreased breath sounds, with diffuse rhonchi Heart: tachycardic, regular rhythm, no murmur, no gallop, and no rub.  Abdomen: soft, non-tender, normal bowel sounds, no distention.  Msk: no joint swelling, no joint warmth, and no redness over joints.  Pulses: 2+ DP/PT pulses bilaterally Extremities: No cyanosis, clubbing, edema Neurologic: alert & oriented X3, cranial nerves II-XII intact, strength normal in all extremities, sensation intact to light touch.  Skin: turgor normal and no rashes.  Psych: Oriented X3, memory intact for recent and remote, normally interactive, good eye contact, mildly anxious appearing.  LABS: 7.325/64/164/33 WBC 6.9, Hgb 14.6, Hct 44.7, Plts 252 Na 143, K 4.2, CL 102, CO2 30, BUN 12, Cr 1.04, Glu 108 BNP 14.8 LA 2.1 I stat trop negative  EKG: I personally reviewed the EKG - sinus tachycardia  CXR: I  personally reviewed the CXR - chronic scarring in right middle & lower fields, and left lower field; consolidation in RML  Assessment & Plan by Problem: Active Problems:   Respiratory distress  Respiratory Distress: Patient with COPD, pulmonary sarcoidosis with bronchiectasis presenting with progressive shortness of breath over several weeks, and acutely exacerbated by lack of medications. Likely bronchiectasis exacerbation as discussed below. No EKG changes, and initial troponin negative - unlikely cardiac source in setting of multiple pulmonary problems.  --admit to step down --Pulm consult --[place] (home dose 3-4L), more if needed; BiPAP as needed --Duoneb and albuterol scheduled --s/p 40mg  IV Lasix in ED  COPD vs bronchiectasis exacerbation: Patient with worsening shortness of breath, cough and sputum production off spiriva and albuterol. CXR concerning for consolidation in R mid lobe.  --Pulm consult --s/p vanc/cefepime in ED; will start levaquin 750mg  daily tomorrow --Solumedrol 125mg  IV once; start prednisone 40mg  daily tomorrow --O2 via [place] (home setting is 3-4); increase  as needed to keep O2 sats 90-93%; BiPAP as needed --Albuterol neb q4hrs; duoneb q 6hrs --sputum culture and gram stain --flu panel --proclacitonin --CM consult for medication assistance   HTN and tachycardia: Patient with history of hypertension managed with bisoprolol and diltiazem. BP 138/102 and tachycardic on evaluation. Tachycardia likely acutely worsened by albuterol treatments.  --continue home dosing diltiazem 120mg  daily; hold bisoprolol for now in setting of exacerbation and cocaine positive on UDS.  OSA:  Patient provided with nightly CPAP.  DVT: lovenox Diet: HH IVF: none Code: FULL  Dispo: Admit patient to Inpatient with expected length of stay greater than 2 midnights.  Signed: Nyra Market, MD 01/12/2016, 9:08 PM  Pager [phone]

## 2016-01-12 NOTE — Progress Notes (Signed)
Patient admitted from ED with RN, on monitor. Patient HR elevated in 140s. In acute respiratory distress on 5L [place], patient placed on bipap per RT at bedside, admission completed, patient now resting in bed on bipap. Other vitals WNL. Daughter and friend called by RN to update. Will continue to monitor.

## 2016-01-12 NOTE — ED Provider Notes (Signed)
[facility]-EMERGENCY DEPT Provider Note   CSN: [id number] Arrival date & time: 01/12/16  1544     History   Chief Complaint Chief Complaint  Patient presents with  . Shortness of Breath    HPI [name] is a 51 y.o. male.  The history is provided by the patient, the EMS personnel and medical records (Referencing chart [phone]#[id number]). The history is limited by the condition of the patient. No language interpreter was used.     This patient is a 51 year old male who presents today with worsening shortness of breath over the last 3 days. Patient has a previously existing chart and I'm referencing the MRN as noted below. Patient has a history of stage IV pulmonary overdoses, bronchiectasis, chronic respiratory failure, chronic hypoxemia with home oxygen use who presents today with 3 days of worsening shortness of breath. EMS was called today and they report that the patient was in significant respiratory distress and unable to speak in complete sentences. They state that he had decreased lung sounds bilaterally. They gave several doses albuterol, ipratropium, 1 dose of magnesium, 1 dose of Solu-Medrol. They do note that they've noticed an improvement in the patient's respiratory symptoms and he is now able to speak more comfortably. Patient transitioned to BiPAP here. He denies any recent fevers. States that his albuterol home is not working. He endorses chest tightness associated with this. States that he has had similar episodes in the past and has had to be intubated for his respiratory distress.  History is limited due to the patient's acuity of condition.    Past Medical History:  Diagnosis Date  . CHF (congestive heart failure) (HCC)   . Chronic respiratory failure (HCC)   . COPD (chronic obstructive pulmonary disease) (HCC)   . Hypertension   . Neuromuscular disorder (HCC)   . Pneumonia   . Sarcoidosis [facility](HCC)     Patient Active Problem List   Diagnosis Date Noted  . Respiratory  distress 01/12/2016    History reviewed. No pertinent surgical history.     Home Medications    Prior to Admission medications   Medication Sig Start Date End Date Taking? Authorizing Provider  albuterol (PROVENTIL HFA;VENTOLIN HFA) 108 (90 Base) MCG/ACT inhaler Inhale 2 puffs into the lungs every 6 (six) hours as needed for wheezing or shortness of breath.   Yes Historical Provider, MD  albuterol (PROVENTIL) (2.5 MG/3ML) 0.083% nebulizer solution Take 2.5 mg by nebulization every 6 (six) hours as needed for wheezing or shortness of breath.   Yes Historical Provider, MD  bisacodyl (DULCOLAX) 5 MG EC tablet Take 5 mg by mouth daily as needed for moderate constipation.   Yes Historical Provider, MD  bisoprolol (ZEBETA) 10 MG tablet Take 10 mg by mouth at bedtime.   Yes Historical Provider, MD  calcium-vitamin D (OSCAL WITH D) 500-200 MG-UNIT tablet Take 1 tablet by mouth daily.   Yes Historical Provider, MD  Cholecalciferol (VITAMIN D PO) Take 1 tablet by mouth daily.   Yes Historical Provider, MD  cyclobenzaprine (FLEXERIL) 10 MG tablet Take 10 mg by mouth 3 (three) times daily as needed for muscle spasms.   Yes Historical Provider, MD  diltiazem (CARTIA XT) 120 MG 24 hr capsule Take 120 mg by mouth at bedtime.   Yes Historical Provider, MD  diphenhydrAMINE (BENADRYL) 25 MG tablet Take 25 mg by mouth every 6 (six) hours as needed for itching or allergies.   Yes Historical Provider, MD  ferrous sulfate 325 (65 FE) MG tablet  Take 325 mg by mouth 2 (two) times daily with a meal.   Yes Historical Provider, MD  guaiFENesin (MUCINEX) 600 MG 12 hr tablet Take 600 mg by mouth 2 (two) times daily as needed for cough or to loosen phlegm.   Yes Historical Provider, MD  Multiple Vitamin (MULTIVITAMIN WITH MINERALS) TABS tablet Take 1 tablet by mouth daily.   Yes Historical Provider, MD  omeprazole (PRILOSEC) 40 MG capsule Take 40 mg by mouth 2 (two) times daily.   Yes Historical Provider, MD  ondansetron  (ZOFRAN) 4 MG tablet Take 4 mg by mouth every 8 (eight) hours as needed for nausea or vomiting.   Yes Historical Provider, MD  OXYGEN Inhale 4 L into the lungs continuous.   Yes Historical Provider, MD  polyethylene glycol (MIRALAX / GLYCOLAX) packet Take 17 g by mouth daily as needed (constipation).   Yes Historical Provider, MD  PRESCRIPTION MEDICATION Inhale into the lungs at bedtime.   Yes Historical Provider, MD  saline (AYR) GEL Place 1 application into the nose every 4 (four) hours as needed (dryness/irritation).   Yes Historical Provider, MD    Family History History reviewed. No pertinent family history.  Social History Social History  Substance Use Topics  . Smoking status: Unknown If Ever Smoked  . Smokeless tobacco: Never Used  . Alcohol use Yes     Allergies   Clindamycin/lincomycin   Review of Systems Review of Systems  Unable to perform ROS: Acuity of condition  Constitutional: Positive for diaphoresis.  HENT: Positive for congestion.   Respiratory: Positive for cough, chest tightness, shortness of breath and wheezing.      Physical Exam Updated Vital Signs BP (!) 160/117 (BP Location: Right Arm)   Pulse (!) 126   Temp 98.1 F (36.7 C) (Axillary)   Resp (!) 21   Ht 5\' 7"  (1.702 m)   Wt 73.5 kg   SpO2 96%   BMI 25.38 kg/m   Physical Exam  Constitutional: He is oriented to person, place, and time. He appears ill. He appears distressed.  HENT:  Head: Normocephalic and atraumatic.  Eyes: Conjunctivae and EOM are normal.  Neck: Normal range of motion. Neck supple.  Cardiovascular: Regular rhythm.  Tachycardia present.   Pulmonary/Chest: He is in respiratory distress.  On BiPAP  Abdominal: Soft. He exhibits no distension. There is no tenderness.  Musculoskeletal: Normal range of motion. He exhibits edema (bilateral lower extremity edema).  Neurological: He is alert and oriented to person, place, and time.  Skin: Skin is warm and dry. He is not  diaphoretic.  Psychiatric: His mood appears anxious.  Nursing note and vitals reviewed.    ED Treatments / Results  Labs (all labs ordered are listed, but only abnormal results are displayed) Labs Reviewed  COMPREHENSIVE METABOLIC PANEL - Abnormal; Notable for the following:       Result Value   Glucose, Bld 108 (*)    All other components within normal limits  RAPID URINE DRUG SCREEN, HOSP PERFORMED - Abnormal; Notable for the following:    Cocaine POSITIVE (*)    All other components within normal limits  I-STAT CG4 LACTIC ACID, ED - Abnormal; Notable for the following:    Lactic Acid, Venous 2.10 (*)    All other components within normal limits  I-STAT ARTERIAL BLOOD GAS, ED - Abnormal; Notable for the following:    pH, Arterial 7.325 (*)    pCO2 arterial 64.2 (*)    pO2, Arterial 164.0 (*)  Bicarbonate 33.4 (*)    Acid-Base Excess 5.0 (*)    All other components within normal limits  I-STAT CG4 LACTIC ACID, ED - Abnormal; Notable for the following:    Lactic Acid, Venous 2.19 (*)    All other components within normal limits  CULTURE, BLOOD (ROUTINE X 2)  CULTURE, BLOOD (ROUTINE X 2)  CULTURE, EXPECTORATED SPUTUM-ASSESSMENT  MRSA PCR SCREENING  CBC WITH DIFFERENTIAL/PLATELET  BRAIN NATRIURETIC PEPTIDE  OCCULT BLOOD X 1 CARD TO LAB, STOOL  BASIC METABOLIC PANEL  CBC  INFLUENZA PANEL BY PCR (TYPE A & B, H1N1)  PROCALCITONIN  I-STAT TROPOININ, ED    EKG  EKG Interpretation  Date/Time:  Monday January 12 2016 15:48:39 EST Ventricular Rate:  131 PR Interval:    QRS Duration: 102 QT Interval:  291 QTC Calculation: 430 R Axis:   95 Text Interpretation:  Sinus tachycardia Borderline right axis deviation Confirmed by Denton Lank  MD, Caryn Bee ([id number]) on 01/12/2016 3:53:49 PM       Radiology Dg Chest Port 1 View  Result Date: 01/12/2016 CLINICAL DATA:  Severe dyspnea. History of CHF, sarcoid, COPD and hypertension EXAM: PORTABLE CHEST 1 VIEW COMPARISON:  None. FINDINGS:  The heart is within normal limits for size. There is aortic atherosclerosis. Streaky mid lung atelectasis and/or scarring noted bilaterally with more confluent adjacent areas of pneumonic consolidation in the right mid lung and left lower lobe. Probable tiny left greater than right pleural effusions. No acute osseous abnormality. Slight tapered appearance of the distal right clavicle, nonspecific in etiology possibly related to prior surgery although none is reported versus osteolysis or old posttraumatic change. IMPRESSION: Pulmonary scarring bilaterally in the mid lung with adjacent areas of pneumonic consolidation in the right mid lung is and left lung base. Electronically Signed   By: Tollie Eth M.D.   On: 01/12/2016 16:11    Procedures Procedures (including critical care time)  Medications Ordered in ED Medications  nitroGLYCERIN (NITROSTAT) SL tablet 0.4 mg (0.4 mg Sublingual Given 01/12/16 1946)  albuterol (PROVENTIL) (2.5 MG/3ML) 0.083% nebulizer solution 5 mg (5 mg Nebulization Not Given 01/12/16 2116)  enoxaparin (LOVENOX) injection 40 mg (40 mg Subcutaneous Given 01/12/16 2139)  sodium chloride flush (NS) 0.9 % injection 3 mL (3 mLs Intravenous Given 01/12/16 2139)  acetaminophen (TYLENOL) tablet 650 mg (not administered)    Or  acetaminophen (TYLENOL) suppository 650 mg (not administered)  diltiazem (CARDIZEM CD) 24 hr capsule 120 mg (120 mg Oral Given 01/12/16 2138)  guaiFENesin (MUCINEX) 12 hr tablet 600 mg (not administered)  ipratropium-albuterol (DUONEB) 0.5-2.5 (3) MG/3ML nebulizer solution 3 mL (0 mLs Nebulization Hold 01/12/16 2114)  albuterol (PROVENTIL) (2.5 MG/3ML) 0.083% nebulizer solution 2.5 mg (2.5 mg Inhalation Not Given 01/12/16 2116)  predniSONE (DELTASONE) tablet 40 mg (not administered)  Influenza vac split quadrivalent PF (FLUARIX) injection 0.5 mL (not administered)  pneumococcal 23 valent vaccine (PNU-IMMUNE) injection 0.5 mL (not administered)  levofloxacin (LEVAQUIN)  IVPB 750 mg (not administered)  nitroGLYCERIN (NITROSTAT) SL tablet 0.4 mg (0.4 mg Sublingual Given 01/12/16 1620)  ceFEPIme (MAXIPIME) 2 g in dextrose 5 % 50 mL IVPB (0 g Intravenous Stopped 01/12/16 1812)  vancomycin (VANCOCIN) IVPB 1000 mg/200 mL premix (1,000 mg Intravenous Transfusing/Transfer 01/12/16 2024)  vancomycin (VANCOCIN) 500 mg in sodium chloride 0.9 % 100 mL IVPB (0 mg Intravenous Stopped 01/12/16 1959)  furosemide (LASIX) injection 40 mg (40 mg Intravenous Given 01/12/16 1956)  albuterol (PROVENTIL) (2.5 MG/3ML) 0.083% nebulizer solution (5 mg Nebulization Given 01/12/16 1951)  methylPREDNISolone sodium succinate (SOLU-MEDROL) 125 mg/2 mL injection 125 mg (125 mg Intravenous Given 01/12/16 2139)     Initial Impression / Assessment and Plan / ED Course  I have reviewed the triage vital signs and the nursing notes.  Pertinent labs & imaging results that were available during my care of the patient were reviewed by me and considered in my medical decision making (see chart for details).  Clinical Course     Patient presents today with worsening shortness of breath or last 3 days. He arrives on BiPAP. Findings on CXR concerning for superimposed pneumonia. BNP is within normal limits, no overt signs of fluid overload today. Patient also getting albuterol here. Has received magnesium, solumedrol, ipratroprium in route.   Blod cultures were collected and patient treated for HCAP. Discussed with hospitalist who will admit the patient to the stepdown unit. Patient remained on BiPAP at the time of admission.   Final Clinical Impressions(s) / ED Diagnoses   Final diagnoses:  SOB (shortness of breath)  HCAP (healthcare-associated pneumonia)  Cocaine abuse    New Prescriptions Current Discharge Medication List       Madolyn Frieze, MD 01/12/16 1610    Cathren Laine, MD 01/14/16 1258

## 2016-01-12 NOTE — Progress Notes (Signed)
Called per RN that Patient cant breath. On arrival noted patient in tripod position, labored respirations, tachypneic, accessory muscle use, and in severe respiratory distress. SATs were in the low 70's with good pleth. Patient was unable to complete sentences stated that "I CANT BREATH" on assessment coarse inspiratory and expiratory wheezes noted throughout, minimum aeration in all lung fields. Heart in the 140's tachy and respirations were in the 40's.  BiPAP was placed on patient to aid breathing. 5mg  Albuterol neb emergently given for SOB/wheezing. RN aware/bedside.

## 2016-01-12 NOTE — ED Notes (Signed)
[phone] Annice Pih(Jackie Paylor)

## 2016-01-12 NOTE — Progress Notes (Signed)
@  [address] Dr. Samuella CotaSvalina of [facility] regarding pts BPs 150s-160s/110s and HR 120s-140s. One time dose of IV Hydralazine ordered. Will administer and continue to monitor.

## 2016-01-12 NOTE — Progress Notes (Signed)
Coughed up Sputum labelled/sent to Lab with requisition, RN aware.

## 2016-01-12 NOTE — Progress Notes (Signed)
RT held Albuterol/Duoneb nebulizer due to hr-146, b/l b.s. with >'d aeration pt. is tolerating BiPAP very well at this time, RT to assess/monitor.

## 2016-01-12 NOTE — ED Triage Notes (Signed)
Per EMS: pt from home c/o increased SOB x 3 days; pt arrived on CPAP with distress noted; pt diaphoretic; pt given 2g mag, 125mg  solu medrol; 10mg  albuterol/.5 atrovent; pt given 1 SL nitro; IV 20g R AC

## 2016-01-12 NOTE — Progress Notes (Signed)
Sputum collection device placed in room, RN aware, RT to monitor.

## 2016-01-12 NOTE — Consult Note (Signed)
Name: [name] MRN: [id number] DOB: 1965-09-05    ADMISSION DATE:  01/12/2016 CONSULTATION DATE:  1/1  REFERRING MD :  Dr. Heide Spark  CHIEF COMPLAINT:  SOB  BRIEF PATIENT DESCRIPTION: 51M with PMH sarcoidosis, OSA, chronic hypoxic failure presenting with 2 weeks of SOB with URI symptoms. Admitted for multifactorial acute on chronic hypoxemic respiratory failure. Required BiPAP. PCCM to see.   SIGNIFICANT EVENTS    STUDIES:    HISTORY OF PRESENT ILLNESS:  [name] is a 51 y.o. male never smoker with Stage IV pulmonary sarcoidosis, bronchiectasis, and chronic respiratory failure with chronic hypoxemia needing home oxygen and sleep apnea needing CPAP. He started feeling sick mid November and presented to the ED where he was treated for COPD exacerbation with Z-pack and prednisone. When he followed up in the pulmonary office 11/30 he was somewhat improved, but had run out of his symbicort. This was replaced and he was still felt to be exacerbating COPD and was started on doxycyline as well as an additional prednisone taper. He presented to the ED 1/1 after not feeling much better since that office visit about one month prior. He called EMS to his house who found him to be in significant respiratory distress unable to speak in complete sentences. He was treated with albuterol, mag, and solu-medrol with improvement. In ED he remained dyspneic and was started on BiPAP. He was admitted to the I[facility]. PCCM asked to see in consultation.    PAST MEDICAL HISTORY :   has a past medical history of CHF (congestive heart failure) (HCC); Chronic respiratory failure (HCC); COPD (chronic obstructive pulmonary disease) (HCC); Hypertension; Neuromuscular disorder (HCC); Pneumonia; and Sarcoidosis (HCC).  has no past surgical history on file. Prior to Admission medications   Medication Sig Start Date End Date Taking? Authorizing Provider  albuterol (PROVENTIL HFA;VENTOLIN HFA) 108 (90 Base) MCG/ACT inhaler  Inhale 2 puffs into the lungs every 6 (six) hours as needed for wheezing or shortness of breath.   Yes Historical Provider, MD  albuterol (PROVENTIL) (2.5 MG/3ML) 0.083% nebulizer solution Take 2.5 mg by nebulization every 6 (six) hours as needed for wheezing or shortness of breath.   Yes Historical Provider, MD  bisacodyl (DULCOLAX) 5 MG EC tablet Take 5 mg by mouth daily as needed for moderate constipation.   Yes Historical Provider, MD  bisoprolol (ZEBETA) 10 MG tablet Take 10 mg by mouth at bedtime.   Yes Historical Provider, MD  calcium-vitamin D (OSCAL WITH D) 500-200 MG-UNIT tablet Take 1 tablet by mouth daily.   Yes Historical Provider, MD  Cholecalciferol (VITAMIN D PO) Take 1 tablet by mouth daily.   Yes Historical Provider, MD  cyclobenzaprine (FLEXERIL) 10 MG tablet Take 10 mg by mouth 3 (three) times daily as needed for muscle spasms.   Yes Historical Provider, MD  diltiazem (CARTIA XT) 120 MG 24 hr capsule Take 120 mg by mouth at bedtime.   Yes Historical Provider, MD  diphenhydrAMINE (BENADRYL) 25 MG tablet Take 25 mg by mouth every 6 (six) hours as needed for itching or allergies.   Yes Historical Provider, MD  ferrous sulfate 325 (65 FE) MG tablet Take 325 mg by mouth 2 (two) times daily with a meal.   Yes Historical Provider, MD  guaiFENesin (MUCINEX) 600 MG 12 hr tablet Take 600 mg by mouth 2 (two) times daily as needed for cough or to loosen phlegm.   Yes Historical Provider, MD  Multiple Vitamin (MULTIVITAMIN WITH MINERALS) TABS tablet Take 1  tablet by mouth daily.   Yes Historical Provider, MD  omeprazole (PRILOSEC) 40 MG capsule Take 40 mg by mouth 2 (two) times daily.   Yes Historical Provider, MD  ondansetron (ZOFRAN) 4 MG tablet Take 4 mg by mouth every 8 (eight) hours as needed for nausea or vomiting.   Yes Historical Provider, MD  OXYGEN Inhale 4 L into the lungs continuous.   Yes Historical Provider, MD  polyethylene glycol (MIRALAX / GLYCOLAX) packet Take 17 g by mouth  daily as needed (constipation).   Yes Historical Provider, MD  PRESCRIPTION MEDICATION Inhale into the lungs at bedtime.   Yes Historical Provider, MD  saline (AYR) GEL Place 1 application into the nose every 4 (four) hours as needed (dryness/irritation).   Yes Historical Provider, MD   Allergies  Allergen Reactions  . Clindamycin/Lincomycin Swelling    Facial and tongue swelling    FAMILY HISTORY:  family history is not on file. SOCIAL HISTORY:  has an unknown smoking status. He has never used smokeless tobacco. He reports that he drinks alcohol. He reports that he does not use drugs.  REVIEW OF SYSTEMS:   Bolds are positive  Constitutional: weight loss, gain, night sweats, Fevers, chills, fatigue .  HEENT: headaches, Sore throat, sneezing, nasal congestion, post nasal drip, Difficulty swallowing, Tooth/dental problems, visual complaints visual changes, ear ache CV:  chest pain, radiates: ,Orthopnea, PND, swelling in lower extremities, dizziness, palpitations, syncope.  GI  heartburn, indigestion, abdominal pain, nausea, vomiting, diarrhea, change in bowel habits, loss of appetite, bloody stools.  Resp: cough, productive: , hemoptysis, dyspnea, chest pain, pleuritic.  Skin: rash or itching or icterus GU: dysuria, change in color of urine, urgency or frequency. flank pain, hematuria  MS: joint pain or swelling. decreased range of motion  Psych: change in mood or affect. depression or anxiety.  Neuro: difficulty with speech, weakness, numbness, ataxia   SUBJECTIVE:   VITAL SIGNS: Temp:  [98.9 F (37.2 C)] 98.9 F (37.2 C) (01/01 2043) Pulse Rate:  [69-144] 144 (01/01 2054) Resp:  [7-29] 22 (01/01 2054) BP: (118-171)/(78-127) 138/102 (01/01 2054) SpO2:  [95 %-100 %] 100 % (01/01 2054) FiO2 (%):  [50 %-60 %] 60 % (01/01 2054) Weight:  [73.5 kg (162 lb 0.6 oz)] 73.5 kg (162 lb 0.6 oz) (01/01 2054)  PHYSICAL EXAMINATION: General:  Male of normal body habitus in NAD Neuro:   Alert, oriented, non-focal HEENT:  [place]/AT, PERRL, no JVD Cardiovascular:  RRR, no MRG Lungs:  Diminished bilateral breath sounds, rhonchi expiration Abdomen:  Soft, non-tender, non-distended Musculoskeletal:  No acute deformity Skin:  Grossly intact   Recent Labs Lab 01/12/16 1553  NA 143  K 4.2  CL 102  CO2 30  BUN 12  CREATININE 1.04  GLUCOSE 108*    Recent Labs Lab 01/12/16 1553  HGB 14.6  HCT 44.7  WBC 6.9  PLT 252   Dg Chest Port 1 View  Result Date: 01/12/2016 CLINICAL DATA:  Severe dyspnea. History of CHF, sarcoid, COPD and hypertension EXAM: PORTABLE CHEST 1 VIEW COMPARISON:  None. FINDINGS: The heart is within normal limits for size. There is aortic atherosclerosis. Streaky mid lung atelectasis and/or scarring noted bilaterally with more confluent adjacent areas of pneumonic consolidation in the right mid lung and left lower lobe. Probable tiny left greater than right pleural effusions. No acute osseous abnormality. Slight tapered appearance of the distal right clavicle, nonspecific in etiology possibly related to prior surgery although none is reported versus osteolysis or old posttraumatic  change. IMPRESSION: Pulmonary scarring bilaterally in the mid lung with adjacent areas of pneumonic consolidation in the right mid lung is and left lung base. Electronically Signed   By: Tollie Ethavid  Kwon M.D.   On: 01/12/2016 16:11    ASSESSMENT / PLAN:  Acute on chronic hypoxemic respiratory failure - likely multifactorial in the setting of known COPD, pulmonary sarcoidosis, and bronchiectasis. Likely exacerbating one, if not more than one, of these. No wheeze on exam, although has been treated fairly thoroughly at this point. He is much improved at the time of my evaluation.  Plan: Supplemental O2 as needed to keep SpO2 88-95% (on 4L at home) BiPAP PRN for work of breathing/hypoxemia Agree with primary team plan for steroids (125 solumedrol today, then start pred 1/2) Agree with  levaquin  Agree with scheduled duoneb/PRN albuterol, may need to transition to xopenex if he remains tachycardic. Agree respiratory cultures and viral workup.  OSA on CPAP Plan: Nocturnal CPAP per home regimen (once no longer needing BiPAP)  Joneen RoachPaul Hoffman, AGACNP-BC [facility] Pager [phone] or [phone] 01/12/2016 11:34 PM   Attending Note:  I have examined patient, reviewed labs, studies and notes. I have discussed the case with Henreitta LeberP Hoffman, and I agree with the data and plans as amended above. 51 yo man, well known to me from the office. He has sarcoidosis, bronchiectasis and associated asthmatic COPD. Also with OSA / chronic resp failure on nocturnal biPAP. As described above he has been dealing with flaring sx for about 1 month, has been treated with courses of steroids and abx. On exam today he is comfortable on biPAP, gets significant dyspnea off of it. Has coarse B BS, rhonchi without wheeze. As has been the case on other occasions when he has flared, his cocaine is positive on his UDS. I have counseled him about this before, suspect it is often the driving force behind his failure to improve, prolonged flares. Agree with steroids, schedule BD's, abx. We will follow with you.  Levy Pupaobert Paelyn Smick, MD, PhD 01/13/2016, 2:58 AM [facility] [phone] or if no answer [phone]

## 2016-01-13 ENCOUNTER — Inpatient Hospital Stay (HOSPITAL_COMMUNITY): Payer: Commercial Managed Care - HMO

## 2016-01-13 DIAGNOSIS — I10 Essential (primary) hypertension: Secondary | ICD-10-CM

## 2016-01-13 DIAGNOSIS — F1410 Cocaine abuse, uncomplicated: Secondary | ICD-10-CM

## 2016-01-13 DIAGNOSIS — Z9989 Dependence on other enabling machines and devices: Secondary | ICD-10-CM

## 2016-01-13 DIAGNOSIS — J9621 Acute and chronic respiratory failure with hypoxia: Secondary | ICD-10-CM

## 2016-01-13 DIAGNOSIS — J9610 Chronic respiratory failure, unspecified whether with hypoxia or hypercapnia: Secondary | ICD-10-CM

## 2016-01-13 DIAGNOSIS — G4733 Obstructive sleep apnea (adult) (pediatric): Secondary | ICD-10-CM

## 2016-01-13 DIAGNOSIS — D860 Sarcoidosis of lung: Secondary | ICD-10-CM

## 2016-01-13 DIAGNOSIS — J471 Bronchiectasis with (acute) exacerbation: Secondary | ICD-10-CM

## 2016-01-13 DIAGNOSIS — J9622 Acute and chronic respiratory failure with hypercapnia: Secondary | ICD-10-CM

## 2016-01-13 LAB — CBC
HCT: 42.5 % (ref 39.0–52.0)
HEMOGLOBIN: 13.7 g/dL (ref 13.0–17.0)
MCH: 26.1 pg (ref 26.0–34.0)
MCHC: 32.2 g/dL (ref 30.0–36.0)
MCV: 81 fL (ref 78.0–100.0)
Platelets: 297 10*3/uL (ref 150–400)
RBC: 5.25 MIL/uL (ref 4.22–5.81)
RDW: 12.5 % (ref 11.5–15.5)
WBC: 5.5 10*3/uL (ref 4.0–10.5)

## 2016-01-13 LAB — BASIC METABOLIC PANEL
ANION GAP: 11 (ref 5–15)
BUN: 15 mg/dL (ref 6–20)
CHLORIDE: 99 mmol/L — AB (ref 101–111)
CO2: 27 mmol/L (ref 22–32)
Calcium: 9.6 mg/dL (ref 8.9–10.3)
Creatinine, Ser: 1.07 mg/dL (ref 0.61–1.24)
GFR calc Af Amer: >60 mL/min (ref >60)
GFR calc non Af Amer: >60 mL/min (ref >60)
Glucose, Bld: 164 mg/dL — ABNORMAL HIGH (ref 65–99)
POTASSIUM: 3.9 mmol/L (ref 3.5–5.1)
Sodium: 137 mmol/L (ref 135–145)

## 2016-01-13 MED ORDER — SALINE SPRAY 0.65 % NA SOLN
1.0000 | NASAL | Status: DC | PRN
  Administered 2016-01-13: 1 via NASAL
  Filled 2016-01-13: qty 44

## 2016-01-13 MED ORDER — ORAL CARE MOUTH RINSE
15.0000 mL | Freq: Two times a day (BID) | OROMUCOSAL | Status: DC
  Administered 2016-01-13 – 2016-01-14 (×3): 15 mL via OROMUCOSAL

## 2016-01-13 MED ORDER — BENZONATATE 100 MG PO CAPS
100.0000 mg | ORAL_CAPSULE | Freq: Three times a day (TID) | ORAL | Status: DC | PRN
  Administered 2016-01-13 – 2016-01-15 (×5): 100 mg via ORAL
  Filled 2016-01-13 (×5): qty 1

## 2016-01-13 MED ORDER — HYDRALAZINE HCL 20 MG/ML IJ SOLN
5.0000 mg | Freq: Once | INTRAMUSCULAR | Status: AC
  Administered 2016-01-13: 5 mg via INTRAVENOUS
  Filled 2016-01-13: qty 1

## 2016-01-13 MED ORDER — ACETAMINOPHEN 325 MG PO TABS
650.0000 mg | ORAL_TABLET | Freq: Four times a day (QID) | ORAL | Status: DC | PRN
  Administered 2016-01-13: 650 mg via ORAL
  Filled 2016-01-13 (×2): qty 2

## 2016-01-13 MED ORDER — LEVALBUTEROL HCL 0.63 MG/3ML IN NEBU
0.6300 mg | INHALATION_SOLUTION | Freq: Four times a day (QID) | RESPIRATORY_TRACT | Status: DC | PRN
  Administered 2016-01-13 – 2016-01-16 (×2): 0.63 mg via RESPIRATORY_TRACT
  Filled 2016-01-13 (×2): qty 3

## 2016-01-13 MED ORDER — LEVALBUTEROL HCL 0.63 MG/3ML IN NEBU
0.6300 mg | INHALATION_SOLUTION | Freq: Four times a day (QID) | RESPIRATORY_TRACT | Status: DC
  Administered 2016-01-13 – 2016-01-14 (×5): 0.63 mg via RESPIRATORY_TRACT
  Filled 2016-01-13 (×6): qty 3

## 2016-01-13 MED ORDER — OXYCODONE-ACETAMINOPHEN 5-325 MG PO TABS
1.0000 | ORAL_TABLET | ORAL | Status: DC | PRN
  Administered 2016-01-13 – 2016-01-17 (×6): 1 via ORAL
  Filled 2016-01-13 (×6): qty 1

## 2016-01-13 MED ORDER — CHLORHEXIDINE GLUCONATE 0.12 % MT SOLN
15.0000 mL | Freq: Two times a day (BID) | OROMUCOSAL | Status: DC
  Administered 2016-01-13 – 2016-01-14 (×4): 15 mL via OROMUCOSAL
  Filled 2016-01-13 (×5): qty 15

## 2016-01-13 MED ORDER — IPRATROPIUM BROMIDE 0.02 % IN SOLN
0.5000 mg | Freq: Four times a day (QID) | RESPIRATORY_TRACT | Status: DC
  Administered 2016-01-13 – 2016-01-14 (×6): 0.5 mg via RESPIRATORY_TRACT
  Filled 2016-01-13 (×7): qty 2.5

## 2016-01-13 MED ORDER — KETOROLAC TROMETHAMINE 30 MG/ML IJ SOLN
30.0000 mg | Freq: Once | INTRAMUSCULAR | Status: AC
Start: 2016-01-13 — End: 2016-01-13
  Administered 2016-01-13: 30 mg via INTRAVENOUS
  Filled 2016-01-13: qty 1

## 2016-01-13 NOTE — Progress Notes (Signed)
Re-page to MD to ask for med for [place]. Tylenol not effective.

## 2016-01-13 NOTE — Progress Notes (Signed)
Subjective: [name] was seen and evaluated today at bedside. He reports his breathing has improved significantly since admission however is not quite back to baseline yet. Denied any acute events overnight outside of his respiratory complaints. Complains of HA which was unrelieved with Tylenol.  Objective:  Vital signs in last 24 hours: Vitals:   01/12/16 2330 01/13/16 0050 01/13/16 0132 01/13/16 0355  BP: (!) 160/117 (!) 151/108 (!) 163/107 (!) 136/103  Pulse: (!) 126 (!) 126 (!) 134 (!) 120  Resp: (!) 21 13 (!) 27 (!) 23  Temp: 98.1 F (36.7 C)   98.1 F (36.7 C)  TempSrc: Axillary   Oral  SpO2: 96% 97% 96% 98%  Weight:      Height:       General: Middle-aged african [place] male, resting comfortably in bed. In no acute distress.  HENT: EOMI. No conjunctival icterus or ptosis.  Cardiovascular: Regular rate and rhythm. No murmur or rub appreciated. Pulmonary: Diminished breath sounds BL. Rhonchi appreciated with expiration. Unlabored breathing and patient was able to speak in complete sentences. Abdomen: Soft, non-tender and non-distended. +bowel sounds.  Extremities: No peripheral edema noted BL. Marland Kitchen No gross deformities. Skin: Warm, dry. No cyanosis.  Psych: Mood normal and affect was mood congruent. Responds to questions appropriately.   CBC Latest Ref Rng & Units 01/13/2016 01/12/2016  WBC 4.0 - 10.5 K/uL 5.5 6.9  Hemoglobin 13.0 - 17.0 g/dL 16.1 09.6  Hematocrit 04.5 - 52.0 % 42.5 44.7  Platelets 150 - 400 K/uL 297 252   CMP Latest Ref Rng & Units 01/13/2016 01/12/2016  Glucose 65 - 99 mg/dL 409(W) 119(J)  BUN 6 - 20 mg/dL 15 12  Creatinine 4.78 - 1.24 mg/dL 2.95 6.21  Sodium 308 - 145 mmol/L 137 143  Potassium 3.5 - 5.1 mmol/L 3.9 4.2  Chloride 101 - 111 mmol/L 99(L) 102  CO2 22 - 32 mmol/L 27 30  Calcium 8.9 - 10.3 mg/dL 9.6 9.9  Total Protein 6.5 - 8.1 g/dL - 7.8  Total Bilirubin 0.3 - 1.2 mg/dL - 0.4  Alkaline Phos 38 - 126 U/L - 101  AST 15 - 41 U/L - 36  ALT 17  - 63 U/L - 21   Drugs of Abuse     Component Value Date/Time   LABOPIA NONE DETECTED 01/12/2016 2113   COCAINSCRNUR POSITIVE (A) 01/12/2016 2113   LABBENZ NONE DETECTED 01/12/2016 2113   AMPHETMU NONE DETECTED 01/12/2016 2113   THCU NONE DETECTED 01/12/2016 2113   LABBARB NONE DETECTED 01/12/2016 2113    2-View CXR reviewed by myself: Slight interval increase of alveolar opacities bilaterally consistent with pneumonia superimposed upon chronic lung disease. No CHF nor large pleural effusion.  Assessment/Plan:  Principal Problem:   Bronchiectasis with acute exacerbation (HCC) Active Problems:   Respiratory distress   Sarcoidosis (HCC)   Hypertension   Chronic respiratory failure (HCC)   Cocaine abuse   OSA on CPAP  Acute on Chronic Hypoxemic Respiratory Failure, Sarcoidosis, COPD, Bronchiectasis, PNA Multifactorial in setting of known Bronchiectasis, Sarcoidosis, COPD. Syx improved since admission, presently on HFNC and saturating well in the high 90's. Not yet at baseline (3-4 L via ). Afebrile ON however consistently tachycardic. No leukocytosis. 2-view CXR obtained today positive for bilateral pneumonia superimposed on chronic lung disease. The patient was seen by his pulmonologist, Dr. Delton Coombes who knows the patient well. Pulm reports that during flares of his chronic lung disease, the patients UDS is positive for cocaine and believes this is  likely the driving force behind his flares. Pt has been counseled on cocaine cessation several times in the past however continues to use despite denying drug use. -Pulm on and agrees with plan of steroids, scheduled bronchodilators and Levaquin.  -Titrate oxygen with goal of 88-95%. On 3-4L at home baseline.  -Continue Levaquin -Pt does have hx of Pseudomonal + staph aureus pneumonia in 2014. Chart review does not demonstrate any recent hospitalizations. Could consider changing therapy to Vanc + Cefepime -Prednisone 40 mg  daily -Procalcitonin negative however will continue to treat pna at this time -Continue Xopenex scheduled.  -Will keep patient in step down unit for close monitoring  Cocaine Abuse Cessation will again be encouraged.   Dispo: Anticipated discharge home in approximately 1-2 day(s).  LOS: 0 days  [name] Peine, DO 01/13/2016, 7:55 AM Pager: [phone]

## 2016-01-13 NOTE — Progress Notes (Signed)
ADMISSION DATE:  01/12/2016 CONSULTATION DATE:  01/12/2016 REFERRING MD :  Dr. Heide Spark  CHIEF COMPLAINT:  SOB  HPI: 51 yo male presented with URI symptoms and dyspnea with acute on chronic hypoxic respiratory failure requiring Bipap.  Hx of Stage IV pulmonary sarcoidosis with BTX, OSA.  UDS positive for cocaine.  SUBJECTIVE:  NAD, depressed  VITAL SIGNS: BP (!) 140/94 (BP Location: Right Arm)   Pulse (!) 114   Temp 97.9 F (36.6 C) (Oral)   Resp 17   Ht 5\' 7"  (1.702 m)   Wt 162 lb 0.6 oz (73.5 kg)   SpO2 100%   BMI 25.38 kg/m    INTAKE/OUTPUT: I/O last 3 completed shifts: In: 340 [P.O.:240; IV Piggyback:100] Out: 900 [Urine:900]  PHYSICAL EXAMINATION: General:  Male of normal body habitus in NAD Neuro:  Alert, oriented, non-focal HEENT:  [place]/AT, PERRL, no JVD Cardiovascular:  RRR, no MRG Lungs:  Diminished bilateral breath sounds, rhonchi expiration Abdomen:  Soft, non-tender, non-distended Musculoskeletal:  No acute deformity Skin:  Grossly intact   CMP Latest Ref Rng & Units 01/13/2016 01/12/2016  Glucose 65 - 99 mg/dL 086(V) 784(O)  BUN 6 - 20 mg/dL 15 12  Creatinine 9.62 - 1.24 mg/dL 9.52 8.41  Sodium 324 - 145 mmol/L 137 143  Potassium 3.5 - 5.1 mmol/L 3.9 4.2  Chloride 101 - 111 mmol/L 99(L) 102  CO2 22 - 32 mmol/L 27 30  Calcium 8.9 - 10.3 mg/dL 9.6 9.9  Total Protein 6.5 - 8.1 g/dL - 7.8  Total Bilirubin 0.3 - 1.2 mg/dL - 0.4  Alkaline Phos 38 - 126 U/L - 101  AST 15 - 41 U/L - 36  ALT 17 - 63 U/L - 21    CBC Latest Ref Rng & Units 01/13/2016 01/12/2016  WBC 4.0 - 10.5 K/uL 5.5 6.9  Hemoglobin 13.0 - 17.0 g/dL 40.1 02.7  Hematocrit 25.3 - 52.0 % 42.5 44.7  Platelets 150 - 400 K/uL 297 252    ABG    Component Value Date/Time   PHART 7.325 (L) 01/12/2016 1715   PCO2ART 64.2 (H) 01/12/2016 1715   PO2ART 164.0 (H) 01/12/2016 1715   HCO3 33.4 (H) 01/12/2016 1715   TCO2 35 01/12/2016 1715   O2SAT 99.0 01/12/2016 1715    IMAGING Dg Chest Port 1  View  Result Date: 01/12/2016 CLINICAL DATA:  Severe dyspnea. History of CHF, sarcoid, COPD and hypertension EXAM: PORTABLE CHEST 1 VIEW COMPARISON:  None. FINDINGS: The heart is within normal limits for size. There is aortic atherosclerosis. Streaky mid lung atelectasis and/or scarring noted bilaterally with more confluent adjacent areas of pneumonic consolidation in the right mid lung and left lower lobe. Probable tiny left greater than right pleural effusions. No acute osseous abnormality. Slight tapered appearance of the distal right clavicle, nonspecific in etiology possibly related to prior surgery although none is reported versus osteolysis or old posttraumatic change. IMPRESSION: Pulmonary scarring bilaterally in the mid lung with adjacent areas of pneumonic consolidation in the right mid lung is and left lung base. Electronically Signed   By: Tollie Eth M.D.   On: 01/12/2016 16:11   Dg Chest 2 View  Result Date: 01/13/2016 CLINICAL DATA:  Shortness of breath, history of COPD, bronchiectasis, sarcoidosis, and CHF. EXAM: CHEST  2 VIEW COMPARISON:  Portable chest x-ray of January 12, 2016 FINDINGS: The lungs are adequately inflated. Confluent interstitial and alveolar opacities persist bilaterally and are more conspicuous. There may be a trace of pleural fluid.  There is no pneumothorax or pneumomediastinum. The heart is top-normal in size. The pulmonary vascularity is normal. The trachea is midline. The bony thorax exhibits no acute abnormality. IMPRESSION: Slight interval increase in the conspicuity of alveolar opacities bilaterally consistent with pneumonia superimposed upon chronic lung disease. No CHF nor large pleural effusion. Electronically Signed   By: David  Swazil[place]Jordan M.D.   On: 01/13/2016 07:57    CULTURES: Blood 1/01 >> Influenza PCR 1/01 >> negative  Respiratory viral panel 1/02 >>  EVENTS: 1/01 Admit  ASSESSMENT / PLAN:  Acute on chronic hypoxic respiratory failure 2nd to PNA  (?viral), cocaine use in setting of Stage IV pulmonary sarcoidosis, BTX, COPD. - check respiratory viral panel  - ABX per primary team >> might be able to d/c abx soon since procalcitonin negative - continue prednisone - continue scheduled BDs - oxygen to keep SpO2 90 to 95% - Bipap as needed   Brett CanalesSteve Minor ACNP [facility] Pager [phone] till 3 pm If no answer page [phone] 01/13/2016, 10:40 AM  Still has cough and clear sputum.  C/o sinus congestion and headache.  B/l crackles Rt > Lt.  Abd soft.  HR regular.  BMP Latest Ref Rng & Units 01/13/2016 01/12/2016  Glucose 65 - 99 mg/dL 132(G164(H) 401(U108(H)  BUN 6 - 20 mg/dL 15 12  Creatinine 2.720.61 - 1.24 mg/dL 5.361.07 6.441.04  Sodium 034135 - 145 mmol/L 137 143  Potassium 3.5 - 5.1 mmol/L 3.9 4.2  Chloride 101 - 111 mmol/L 99(L) 102  CO2 22 - 32 mmol/L 27 30  Calcium 8.9 - 10.3 mg/dL 9.6 9.9    CBC Latest Ref Rng & Units 01/13/2016 01/12/2016  WBC 4.0 - 10.5 K/uL 5.5 6.9  Hemoglobin 13.0 - 17.0 g/dL 74.213.7 59.514.6  Hematocrit 63.839.0 - 52.0 % 42.5 44.7  Platelets 150 - 400 K/uL 297 252    Assessment/plan:  Acute on chronic respiratory failure 2nd to pneumonia (?viral), cocaine, COPD, sarcoid with BTX. - respiratory viral panel - steroids, bds, oxygen, Bipap  Coralyn HellingVineet Obelia Bonello, MD [facility] 01/13/2016, 1:04 PM Pager:  [phone] After 3pm call: [phone]

## 2016-01-14 DIAGNOSIS — B348 Other viral infections of unspecified site: Secondary | ICD-10-CM

## 2016-01-14 LAB — GLUCOSE, CAPILLARY: Glucose-Capillary: 113 mg/dL — ABNORMAL HIGH (ref 65–99)

## 2016-01-14 LAB — RESPIRATORY PANEL BY PCR
ADENOVIRUS-RVPPCR: NOT DETECTED
BORDETELLA PERTUSSIS-RVPCR: NOT DETECTED
CORONAVIRUS 229E-RVPPCR: NOT DETECTED
CORONAVIRUS HKU1-RVPPCR: NOT DETECTED
CORONAVIRUS NL63-RVPPCR: NOT DETECTED
CORONAVIRUS OC43-RVPPCR: NOT DETECTED
Chlamydophila pneumoniae: NOT DETECTED
Influenza A: NOT DETECTED
Influenza B: NOT DETECTED
METAPNEUMOVIRUS-RVPPCR: NOT DETECTED
Mycoplasma pneumoniae: NOT DETECTED
PARAINFLUENZA VIRUS 1-RVPPCR: NOT DETECTED
PARAINFLUENZA VIRUS 2-RVPPCR: NOT DETECTED
PARAINFLUENZA VIRUS 3-RVPPCR: NOT DETECTED
Parainfluenza Virus 4: NOT DETECTED
Respiratory Syncytial Virus: NOT DETECTED
Rhinovirus / Enterovirus: DETECTED — AB

## 2016-01-14 LAB — PROCALCITONIN

## 2016-01-14 LAB — EXPECTORATED SPUTUM ASSESSMENT W GRAM STAIN, RFLX TO RESP C

## 2016-01-14 LAB — EXPECTORATED SPUTUM ASSESSMENT W REFEX TO RESP CULTURE

## 2016-01-14 MED ORDER — DIPHENHYDRAMINE HCL 25 MG PO CAPS
25.0000 mg | ORAL_CAPSULE | Freq: Once | ORAL | Status: AC
  Administered 2016-01-14: 25 mg via ORAL
  Filled 2016-01-14: qty 1

## 2016-01-14 MED ORDER — ONDANSETRON HCL 4 MG PO TABS
4.0000 mg | ORAL_TABLET | Freq: Three times a day (TID) | ORAL | Status: DC | PRN
  Administered 2016-01-14 – 2016-01-17 (×4): 4 mg via ORAL
  Filled 2016-01-14 (×4): qty 1

## 2016-01-14 MED ORDER — DIPHENHYDRAMINE HCL 25 MG PO CAPS
25.0000 mg | ORAL_CAPSULE | Freq: Three times a day (TID) | ORAL | Status: DC | PRN
  Administered 2016-01-14 – 2016-01-15 (×3): 25 mg via ORAL
  Filled 2016-01-14 (×3): qty 1

## 2016-01-14 NOTE — Care Management Note (Addendum)
Case Management Note  Patient Details  Name: [name] MRN: [id number] Date of Birth: 02/07/1965  Subjective/Objective:  From home alone pta indep,  Presents with pna, pulmonary sacradosis , on abx, has home oxygen with [employer] 4-5 liters, he has a tank in his girlfriends car, she will bring at discharge.  He has a PCP, [facility] at [facility].  Patient states he has no problem with getting medications at dc.  NCm will cont to follow for dc needs.                  Action/Plan:   Expected Discharge Date:                  Expected Discharge Plan:  Home/Self Care  In-House Referral:     Discharge planning Services  CM Consult  Post Acute Care Choice:    Choice offered to:     DME Arranged:    DME Agency:     HH Arranged:    HH Agency:     Status of Service:  Completed, signed off  If discussed at [employer] Length of Stay Meetings, dates discussed:    Additional Comments:  Leone Havenaylor, Keir Foland Clinton, RN 01/14/2016, 12:25 PM

## 2016-01-14 NOTE — Progress Notes (Addendum)
ADMISSION DATE:  01/12/2016 CONSULTATION DATE:  01/12/2016 REFERRING MD :  Dr. Heide Spark  CHIEF COMPLAINT:  SOB  HPI: 51 yo male presented with URI symptoms and dyspnea with acute on chronic hypoxic respiratory failure requiring Bipap.  Hx of Stage IV pulmonary sarcoidosis with BTX, OSA.  UDS positive for cocaine.  SUBJECTIVE:  NAD, depressed  VITAL SIGNS: BP 124/89 (BP Location: Left Arm)   Pulse (!) 107   Temp 98.2 F (36.8 C) (Axillary)   Resp (!) 26   Ht 5\' 7"  (1.702 m)   Wt 162 lb 0.6 oz (73.5 kg)   SpO2 97%   BMI 25.38 kg/m    INTAKE/OUTPUT: I/O last 3 completed shifts: In: 340 [P.O.:240; IV Piggyback:100] Out: 1450 [Urine:1450]  PHYSICAL EXAMINATION: General:  Male of normal body habitus in NAD, on cpap but awake. Reports feeling better Neuro:  Alert, oriented, non-focal HEENT:  [place]/AT, PERRL, no JVD Cardiovascular:  RRR, no MRG Lungs:  Diminished bilateral breath sounds,CTA on cpap Abdomen:  Soft, non-tender, non-distended Musculoskeletal:  No acute deformity Skin:  Grossly intact   CMP Latest Ref Rng & Units 01/13/2016 01/12/2016  Glucose 65 - 99 mg/dL 161(W) 960(A)  BUN 6 - 20 mg/dL 15 12  Creatinine 5.40 - 1.24 mg/dL 9.81 1.91  Sodium 478 - 145 mmol/L 137 143  Potassium 3.5 - 5.1 mmol/L 3.9 4.2  Chloride 101 - 111 mmol/L 99(L) 102  CO2 22 - 32 mmol/L 27 30  Calcium 8.9 - 10.3 mg/dL 9.6 9.9  Total Protein 6.5 - 8.1 g/dL - 7.8  Total Bilirubin 0.3 - 1.2 mg/dL - 0.4  Alkaline Phos 38 - 126 U/L - 101  AST 15 - 41 U/L - 36  ALT 17 - 63 U/L - 21    CBC Latest Ref Rng & Units 01/13/2016 01/12/2016  WBC 4.0 - 10.5 K/uL 5.5 6.9  Hemoglobin 13.0 - 17.0 g/dL 29.5 62.1  Hematocrit 30.8 - 52.0 % 42.5 44.7  Platelets 150 - 400 K/uL 297 252    ABG    Component Value Date/Time   PHART 7.325 (L) 01/12/2016 1715   PCO2ART 64.2 (H) 01/12/2016 1715   PO2ART 164.0 (H) 01/12/2016 1715   HCO3 33.4 (H) 01/12/2016 1715   TCO2 35 01/12/2016 1715   O2SAT 99.0 01/12/2016  1715    IMAGING Dg Chest Port 1 View  Result Date: 01/12/2016 CLINICAL DATA:  Severe dyspnea. History of CHF, sarcoid, COPD and hypertension EXAM: PORTABLE CHEST 1 VIEW COMPARISON:  None. FINDINGS: The heart is within normal limits for size. There is aortic atherosclerosis. Streaky mid lung atelectasis and/or scarring noted bilaterally with more confluent adjacent areas of pneumonic consolidation in the right mid lung and left lower lobe. Probable tiny left greater than right pleural effusions. No acute osseous abnormality. Slight tapered appearance of the distal right clavicle, nonspecific in etiology possibly related to prior surgery although none is reported versus osteolysis or old posttraumatic change. IMPRESSION: Pulmonary scarring bilaterally in the mid lung with adjacent areas of pneumonic consolidation in the right mid lung is and left lung base. Electronically Signed   By: Tollie Eth M.D.   On: 01/12/2016 16:11   Dg Chest 2 View  Result Date: 01/13/2016 CLINICAL DATA:  Shortness of breath, history of COPD, bronchiectasis, sarcoidosis, and CHF. EXAM: CHEST  2 VIEW COMPARISON:  Portable chest x-ray of January 12, 2016 FINDINGS: The lungs are adequately inflated. Confluent interstitial and alveolar opacities persist bilaterally and are more conspicuous. There  may be a trace of pleural fluid. There is no pneumothorax or pneumomediastinum. The heart is top-normal in size. The pulmonary vascularity is normal. The trachea is midline. The bony thorax exhibits no acute abnormality. IMPRESSION: Slight interval increase in the conspicuity of alveolar opacities bilaterally consistent with pneumonia superimposed upon chronic lung disease. No CHF nor large pleural effusion. Electronically Signed   By: David  [place]Jordan M.D.   On: 01/13/2016 07:57    CULTURES: Blood 1/01 >> Influenza PCR 1/01 >> negative  Respiratory viral panel 1/02 >>  EVENTS: 1/01 Admit  ASSESSMENT / PLAN:  Acute on chronic hypoxic  respiratory failure 2nd to PNA (?viral), cocaine use in setting of Stage IV pulmonary sarcoidosis, BTX, COPD. - check respiratory viral panel  - ABX per primary team >> might be able to d/c abx soon since procalcitonin negative - continue prednisone, consider taper Take 4 tabs  daily with food x 4 days, then 3 tabs daily x 4 days, then 2 tabs daily x 4 days, then 1 tab daily x4 days then stop.  - continue scheduled BDs - oxygen to keep SpO2 90 to 95% - Bipap as needed, cpap at home -Consider transfer to floor 1/3   [facility] ACNP [facility] Pager [phone] till 3 pm If no answer page [phone] 01/14/2016, 9:30 AM   Still has cough, but breathing better.  Still has intermittent HA.  Sitting up in chair.  No wheeze, better air movement  RSV - rhinovirus.  Assessment/plan:  Acute on chronic respiratory failure 2nd to pneumonia with rhinovirus, cocaine. Hx of COPD, Stage IV pulmonary sarcoidosis, bronchiectasis. - oxygen to keep SpO2 90 to 95% - scheduled BDs - wean prednisone - likely can d/c Abx soon since procalcitonin negative >> defer to primary team  He has appt with Dr. Delton CoombesByrum scheduled for 01/22/16.  PCCM will sign off.  Coralyn HellingVineet Laquilla Dault, MD [facility] 01/14/2016, 3:18 PM Pager:  [phone] After 3pm call: [phone]

## 2016-01-14 NOTE — Progress Notes (Signed)
Report called pt to transfer to 6N via w/c with belongings.

## 2016-01-14 NOTE — Progress Notes (Addendum)
   Subjective: [name] was seen and evaluated today at bedside. He has no acute complaints and reports that his breathing continues to improve since admission. Does not feel quite yet back to baseline nor ready for discharge, however is comfortable being transferred out of step-down unit.   Objective:  Vital signs in last 24 hours: Vitals:   01/13/16 1912 01/13/16 2329 01/14/16 0419 01/14/16 0700  BP: 114/76 (!) 125/91 (!) 119/97 124/89  Pulse: 95 (!) 104 (!) 101 (!) 107  Resp: 13 18 14  (!) 26  Temp: 98 F (36.7 C) 98.4 F (36.9 C) 97.7 F (36.5 C) 98.2 F (36.8 C)  TempSrc: Oral Oral Axillary Axillary  SpO2: 95% 98% 99% 97%  Weight:      Height:       General: Middle-aged african [place] male, In no acute distress. Resting comfortably in bed on CPAP.  HENT: EOMI. No conjunctival injection, icterus or ptosis. Cardiovascular: Regular rate and rhythm. No murmur or rub appreciated. Pulmonary: Diminished breath sounds BL however otherwise clear. Unlabored breathing. Able to speak in full sentences.  Abdomen: Soft, non-tender and non-distended. No guarding or rigidity. +bowel sounds.  Extremities: No peripheral edema noted BL. Intact distal pulses. No gross deformities. Skin: Warm, dry. No cyanosis. Psych: Mood normal and affect was mood congruent. Responds to questions appropriately.   Dg Chest Port 1 View Result Date: 01/12/2016 IMPRESSION: Pulmonary scarring bilaterally in the mid lung with adjacent areas of pneumonic consolidation in the right mid lung is and left lung base.   Dg Chest 2 View Result Date: 01/13/2016 IMPRESSION: Slight interval increase in the conspicuity of alveolar opacities bilaterally consistent with pneumonia superimposed upon chronic lung disease. No CHF nor large pleural effusion.  Assessment/Plan:  Principal Problem:   Bronchiectasis with acute exacerbation (HCC) Active Problems:   Respiratory distress   Sarcoidosis (HCC)   Hypertension   Chronic  respiratory failure (HCC)   Cocaine abuse   OSA on CPAP  Acute on Chronic Hypoxic Respiratory Failure In the setting of advanced sarcoidosis, bronchiectasis and OSA on nightly CPAP. Requires 3-4L continuous O2 at home. Hasn't required BiPAP since initial presentation. Was on CPAP during evaluation and was breathing comfortably. 2-view CXR yesterday significant for BL PNA and will continue tx at this time with Levaquin. Have obtained sputum culture and are awaiting final results, so far is showing few WBC's, moderate gram negative rods, few gram positive rods and few gram positive cocci in pairs. Had hx of pseudomonas and staph aureus in 2014 from a trach he had at that time, Pseudomonas was sensitive to Levaquin. No hospitalizations in past 90 days. -Respiratory panel has resulted, positive for Rhinovirus/Enterovirus however otherwise negative -Likely contributing factor in pts clinical picture. -Continue Prednisone 40 mg daily, followed by taper -Continue Levaquin -Transfer to med/surg -O2 to keep SpO2 90-95%  Dispo: Anticipated discharge in approximately 1 day(s).  LOS: 2  Bethany Molt, DO 01/14/2016, 7:41 AM Pager: [phone]  Internal Medicine Attending Addendum:  This patient's plan of care was discussed with the house staff. Please see their note for complete details. I concur with their plan.   Earl LagosNischal Nguyen Butler, MD 01/14/2016, 9:45 PM

## 2016-01-15 DIAGNOSIS — J1289 Other viral pneumonia: Principal | ICD-10-CM

## 2016-01-15 DIAGNOSIS — B9789 Other viral agents as the cause of diseases classified elsewhere: Secondary | ICD-10-CM

## 2016-01-15 LAB — CBC WITH DIFFERENTIAL/PLATELET
BASOS ABS: 0 10*3/uL (ref 0.0–0.1)
BASOS PCT: 0 %
EOS ABS: 0 10*3/uL (ref 0.0–0.7)
Eosinophils Relative: 0 %
HCT: 41.2 % (ref 39.0–52.0)
HEMOGLOBIN: 13.1 g/dL (ref 13.0–17.0)
LYMPHS ABS: 1 10*3/uL (ref 0.7–4.0)
Lymphocytes Relative: 12 %
MCH: 26.4 pg (ref 26.0–34.0)
MCHC: 31.8 g/dL (ref 30.0–36.0)
MCV: 83.1 fL (ref 78.0–100.0)
Monocytes Absolute: 0.6 10*3/uL (ref 0.1–1.0)
Monocytes Relative: 7 %
NEUTROS PCT: 81 %
Neutro Abs: 6.8 10*3/uL (ref 1.7–7.7)
Platelets: 274 10*3/uL (ref 150–400)
RBC: 4.96 MIL/uL (ref 4.22–5.81)
RDW: 13.1 % (ref 11.5–15.5)
WBC: 8.3 10*3/uL (ref 4.0–10.5)

## 2016-01-15 LAB — COMPREHENSIVE METABOLIC PANEL
ALBUMIN: 3.7 g/dL (ref 3.5–5.0)
ALK PHOS: 70 U/L (ref 38–126)
ALT: 21 U/L (ref 17–63)
AST: 38 U/L (ref 15–41)
Anion gap: 9 (ref 5–15)
BUN: 18 mg/dL (ref 6–20)
CALCIUM: 9.7 mg/dL (ref 8.9–10.3)
CO2: 31 mmol/L (ref 22–32)
CREATININE: 1.17 mg/dL (ref 0.61–1.24)
Chloride: 96 mmol/L — ABNORMAL LOW (ref 101–111)
GFR calc Af Amer: >60 mL/min (ref >60)
GFR calc non Af Amer: >60 mL/min (ref >60)
GLUCOSE: 110 mg/dL — AB (ref 65–99)
Potassium: 3.9 mmol/L (ref 3.5–5.1)
SODIUM: 136 mmol/L (ref 135–145)
Total Bilirubin: 0.4 mg/dL (ref 0.3–1.2)
Total Protein: 6.8 g/dL (ref 6.5–8.1)

## 2016-01-15 LAB — CULTURE, RESPIRATORY W GRAM STAIN

## 2016-01-15 LAB — GLUCOSE, CAPILLARY: Glucose-Capillary: 109 mg/dL — ABNORMAL HIGH (ref 65–99)

## 2016-01-15 LAB — CULTURE, RESPIRATORY: CULTURE: NORMAL

## 2016-01-15 MED ORDER — LEVALBUTEROL HCL 0.63 MG/3ML IN NEBU
0.6300 mg | INHALATION_SOLUTION | Freq: Three times a day (TID) | RESPIRATORY_TRACT | Status: DC
  Administered 2016-01-15 – 2016-01-17 (×6): 0.63 mg via RESPIRATORY_TRACT
  Filled 2016-01-15 (×7): qty 3

## 2016-01-15 MED ORDER — IPRATROPIUM BROMIDE 0.02 % IN SOLN
0.5000 mg | Freq: Three times a day (TID) | RESPIRATORY_TRACT | Status: DC
  Administered 2016-01-15 – 2016-01-17 (×6): 0.5 mg via RESPIRATORY_TRACT
  Filled 2016-01-15 (×7): qty 2.5

## 2016-01-15 NOTE — Progress Notes (Signed)
   Subjective: [name] was seen and evaluated today at bedside. He was resting comfortably sitting upright on the edge of the bed on [place]. Reports he is feeling better overall from admission however does not yet feel back to baseline. Has not yet been able to get up and ambulate around the room. Still would like to stay 1 more day.   Objective:  Vital signs in last 24 hours: Vitals:   01/14/16 2034 01/15/16 0930 01/15/16 1426 01/15/16 1446  BP: (!) 130/91  119/68   Pulse: 99  (!) 108   Resp: 19  19   Temp: 98.2 F (36.8 C)  98 F (36.7 C)   TempSrc: Oral  Oral   SpO2: 100% 100% 100% 100%  Weight:      Height:       General: Middle-aged african [place] male, In no acute distress. Resting comfortably on side of bed, on 5L via [place].  HENT: EOMI. No conjunctival injection, icterus or ptosis. Cardiovascular: Regular rate and rhythm. No murmur or rub appreciated. Pulmonary: Diminished breath sounds BL however otherwise clear. Unlabored breathing. Able to speak in full sentences.  Abdomen: Soft and non-distended. +bowel sounds.  Skin: Warm, dry. No cyanosis. Psych: Mood normal and affect was mood congruent. Responds to questions appropriately.   Assessment/Plan:  Principal Problem:   Bronchiectasis with acute exacerbation (HCC) Active Problems:   Respiratory distress   Sarcoidosis (HCC)   Hypertension   Chronic respiratory failure (HCC)   Cocaine abuse   OSA on CPAP  Viral Pneumonia in setting of Stage IV Sarcoidosis with Chronic Respiratory Failure Resp panel positive for Rhinovirus. Procalcitonin normal x2 and pt has been without fever or leukocytosis. Suspect this is likely a viral pneumonia with severity exacerbated due to pts underlying chronic lung disease.  -Blood cultures negative x2 -D/c Levaquin - has received 4 days of therapy -Prednisone 40 mg daily - d/c home with taper -PCCM has signed off with agreement of above treatment plan -presently on [place] - will ambulate  patient with pulse ox to see where patient is compared to baseline (baseline 3.5-5L at home) -F/u appt already scheduled with Dr. Delton CoombesByrum 1/11  Dispo: Anticipated discharge in approximately 1 day(s).   Myha Arizpe, DO 01/15/2016, 3:02 PM Pager: [phone]

## 2016-01-15 NOTE — Progress Notes (Signed)
SATURATION QUALIFICATIONS: (This note is used to comply with regulatory documentation for home oxygen)  Patient Saturations on Room Air at Rest = 92%  Patient Saturations on Room Air while Ambulating = 82%    Please briefly explain why patient needs home oxygen: Patient desats on room air while ambulating

## 2016-01-16 ENCOUNTER — Encounter (HOSPITAL_COMMUNITY): Payer: Self-pay | Admitting: *Deleted

## 2016-01-16 LAB — BASIC METABOLIC PANEL
ANION GAP: 10 (ref 5–15)
BUN: 18 mg/dL (ref 6–20)
CALCIUM: 9.3 mg/dL (ref 8.9–10.3)
CHLORIDE: 98 mmol/L — AB (ref 101–111)
CO2: 28 mmol/L (ref 22–32)
Creatinine, Ser: 1.11 mg/dL (ref 0.61–1.24)
GFR calc non Af Amer: >60 mL/min (ref >60)
GLUCOSE: 212 mg/dL — AB (ref 65–99)
POTASSIUM: 4.9 mmol/L (ref 3.5–5.1)
Sodium: 136 mmol/L (ref 135–145)

## 2016-01-16 LAB — CBC
HEMATOCRIT: 41.7 % (ref 39.0–52.0)
HEMOGLOBIN: 13.4 g/dL (ref 13.0–17.0)
MCH: 26.9 pg (ref 26.0–34.0)
MCHC: 32.1 g/dL (ref 30.0–36.0)
MCV: 83.6 fL (ref 78.0–100.0)
Platelets: 277 10*3/uL (ref 150–400)
RBC: 4.99 MIL/uL (ref 4.22–5.81)
RDW: 13.1 % (ref 11.5–15.5)
WBC: 5.6 10*3/uL (ref 4.0–10.5)

## 2016-01-16 LAB — GLUCOSE, CAPILLARY: Glucose-Capillary: 100 mg/dL — ABNORMAL HIGH (ref 65–99)

## 2016-01-16 LAB — PROCALCITONIN

## 2016-01-16 MED ORDER — GUAIFENESIN-CODEINE 100-10 MG/5ML PO SOLN
5.0000 mL | Freq: Four times a day (QID) | ORAL | Status: DC
  Administered 2016-01-16 – 2016-01-17 (×4): 5 mL via ORAL
  Filled 2016-01-16 (×4): qty 5

## 2016-01-16 MED ORDER — ACETAMINOPHEN-CODEINE #3 300-30 MG PO TABS
1.0000 | ORAL_TABLET | Freq: Every day | ORAL | Status: DC
  Administered 2016-01-16: 2 via ORAL
  Filled 2016-01-16: qty 2

## 2016-01-16 MED ORDER — LEVALBUTEROL HCL 1.25 MG/0.5ML IN NEBU
1.2500 mg | INHALATION_SOLUTION | Freq: Once | RESPIRATORY_TRACT | Status: DC
  Filled 2016-01-16: qty 0.5

## 2016-01-16 MED ORDER — DIPHENHYDRAMINE HCL 25 MG PO CAPS
25.0000 mg | ORAL_CAPSULE | Freq: Four times a day (QID) | ORAL | Status: DC | PRN
  Administered 2016-01-16 – 2016-01-17 (×3): 25 mg via ORAL
  Filled 2016-01-16 (×3): qty 1

## 2016-01-16 MED ORDER — GUAIFENESIN-CODEINE 100-10 MG/5ML PO SOLN
5.0000 mL | ORAL | Status: DC | PRN
  Administered 2016-01-16: 5 mL via ORAL
  Filled 2016-01-16: qty 5

## 2016-01-16 NOTE — Discharge Summary (Signed)
Name: [name] MRN: [id number] DOB: [birth year] 51 y.o. PCP: [facility]  Date of Admission: 01/12/2016  3:44 PM Date of Discharge: 01/16/2016 Attending Physician: Debe Coder, MD. Cephas Darby, MD  Discharge Diagnosis: 1. Viral Pneumonia 2. Sarcoidosis, Stage IV 3. Acute on Chronic Respiratory Failure  Principal Problem:   Bronchiectasis with acute exacerbation (HCC) Active Problems:   Respiratory distress   Sarcoidosis (HCC)   Hypertension   Chronic respiratory failure (HCC)   Cocaine abuse   OSA on CPAP   Discharge Medications: Allergies as of 01/17/2016      Reactions   Clindamycin/lincomycin Anaphylaxis, Swelling   Made face and tongue swell   Clindamycin/lincomycin Swelling   Facial and tongue swelling      Medication List    TAKE these medications   albuterol (2.5 MG/3ML) 0.083% nebulizer solution Commonly known as:  PROVENTIL Take 2.5 mg by nebulization every 6 (six) hours as needed for wheezing or shortness of breath.   albuterol 108 (90 Base) MCG/ACT inhaler Commonly known as:  PROVENTIL HFA;VENTOLIN HFA Inhale 2 puffs into the lungs every 6 (six) hours as needed for wheezing or shortness of breath.   BENADRYL 25 MG tablet Generic drug:  diphenhydrAMINE Take 25 mg by mouth every 6 (six) hours as needed for itching or allergies.   bisacodyl 5 MG EC tablet Commonly known as:  DULCOLAX Take 5 mg by mouth daily as needed for moderate constipation.   bisoprolol 10 MG tablet Commonly known as:  ZEBETA Take 10 mg by mouth at bedtime.   calcium-vitamin D 500-200 MG-UNIT tablet Commonly known as:  OSCAL WITH D Take 1 tablet by mouth daily.   CARTIA XT 120 MG 24 hr capsule Generic drug:  diltiazem Take 120 mg by mouth at bedtime.   cyclobenzaprine 10 MG tablet Commonly known as:  FLEXERIL Take 10 mg by mouth 3 (three) times daily as needed for muscle spasms.   ferrous sulfate 325 (65 FE) MG tablet Take 325 mg by mouth 2 (two)  times daily with a meal.   guaiFENesin 600 MG 12 hr tablet Commonly known as:  MUCINEX Take 600 mg by mouth 2 (two) times daily as needed for cough or to loosen phlegm.   guaiFENesin-codeine 100-10 MG/5ML syrup Take 5 mLs by mouth 4 (four) times daily.   multivitamin with minerals Tabs tablet Take 1 tablet by mouth daily.   omeprazole 40 MG capsule Commonly known as:  PRILOSEC Take 40 mg by mouth 2 (two) times daily.   ondansetron 4 MG tablet Commonly known as:  ZOFRAN Take 4 mg by mouth every 8 (eight) hours as needed for nausea or vomiting.   OXYGEN Inhale 4 L into the lungs continuous.   polyethylene glycol packet Commonly known as:  MIRALAX / GLYCOLAX Take 17 g by mouth daily as needed (constipation).   predniSONE 10 MG (21) Tbpk tablet Commonly known as:  STERAPRED UNI-PAK 21 TAB Take 1 tablet (10 mg total) by mouth as directed. Take 4 tabs daily with food for 4 days, then 3 tabs daily for 4 days, then 2 tabs daily for 4 days, then 1 tab daily for 4 days then stop.   PRESCRIPTION MEDICATION Inhale into the lungs at bedtime.   saline Gel Place 1 application into the nose every 4 (four) hours as needed (dryness/irritation).   VITAMIN D PO Take 1 tablet by mouth daily.       Disposition and follow-up:   Mr.Gared Oscar was [facility] ST[facility].  At the hospital follow up visit please address:  1.  Compliance with prednisone taper. How has his breathing been since discharge?   2.  Labs / imaging needed at time of follow-up: follow-up CXR in 6 if felt to be warranted   3.  Pending labs/ test needing follow-up: none.  Follow-up Appointments:   PULMONOLOGY, PCP  Hospital Course by problem list: Principal Problem:   Bronchiectasis with acute exacerbation (HCC) Active Problems:   Respiratory distress   Sarcoidosis (HCC)   Hypertension   Chronic respiratory failure (HCC)   Cocaine abuse   OSA on CPAP   1. Viral  Pneumonia This is a 51 year old male with stage IV sarcoidosis, chronic respiratory failure on 3-5 L at baseline who presents with a 1 month history of progressive shortness of breath. He had been seen in the ED and by his pulmonologist for this complaint, at which time he was prescribed prednisone and antibiotics however symptoms had not completely resolved because the patient ran out of his Symbicort 2 weeks prior to presentation. He also ran out of his albuterol several days prior to admission. On initial evaluation in the ED, he was found to be in respiratory distress and was placed on BiPAP which was weaned to [facility] then eventually to [place]. His CXR showed BL consolidation as well as chronic scarring. He received IV Vancomycin and Cefepime in ED and antibiotics were narrowed to Levaquin following this. He was given steroids as well, with improvement of his symptoms. After 4 days of therapy, the patients respiratory viral panel returned positive for Rhinovirus and procalcitonin measurements were negative x2. At this time, Levaquin was discontinued as this was felt to be a viral pneumonia. He was seen by his pulmonologist during admission who agreed with the plan. At this time, the patients shortness of breath had improved significantly however he complained of disruptive cough which was controlled with codeine-containing cough syrup. He was subsequently discharged home when he was tolerating his home O2 with prescriptions for a prednisone taper and cough suppression.   2. Sarcoidosis, Stage IV As above. Was seen and evaluated by his pulmonologist who assisted in the care of the patient. F/u appointment scheduled while patient in hospital.   3. Acute on Chronic Respiratory Failure Resolved as above.   Discharge Vitals:   BP (!) 156/101 (BP Location: Left Arm)   Pulse (!) 105   Temp 98.4 F (36.9 C) (Oral)   Resp 20   Ht 5' 7.5" (1.715 m)   Wt 163 lb 5.8 oz (74.1 kg)   SpO2 94%   BMI 25.21 kg/m    Pertinent Labs, Studies, and Procedures:  Respiratory viral panel: +Rhinovirus Influenza A and B: negative 2-View CXR: Pneumonia superimposed on chronic lung disease CBC: without leukocytosis Procalcitonin: Negative x2  Discharge Instructions: Increase activity slowly! Your lungs may take some time to heal. Please complete the prescribed taper of prednisone. Please go to your follow-up appointments with your pulmonologist and primary care physician.   SignedNoemi Chapel: Zylie Mumaw, DO 01/16/2016, 9:30 PM   Pager: [phone]

## 2016-01-16 NOTE — Progress Notes (Signed)
Physical Therapy Evaluation Patient Details Name: [name] MRN: [id number] DOB: 08/21/1965 Today's Date: 01/16/2016   History of Present Illness  [name] is a 51yo male with PMH of Stage 4 Sarcoidosis, COPD, Bronchiectasis, OSA,  HTN, GERD and h/o intubation due to pneumothorax s/p VATs, [facility]. Patient states that he has had progressive symptoms since November that seemed to have slightly improved after an ED visit and Pulmonology visit at which he received prednisone tapers and antibiotics. Patient states he was never completely back to baseline and has since gotten worse. Dx with bronchiectasis with acute exacerbation for this hospital visit  Clinical Impression  PTA, pt was independent with all ADLs and community ambulation without an assistive device. Pt currently lives alone but daughter and 2 grandchildren will be staying with pt after d/c. Pt able to ambulate 400' today but required standing rest break x2 2/2 desat on 4L then 5L of O2. VC's required for pursed lip and deep breathing. Pt would benefit from continued PT during hospital stay to address decreased activity tolerance. PT will continue to follow acutely.    SATURATION QUALIFICATIONS: (This note is used to comply with regulatory documentation for home oxygen)  Patient Saturations on Room Air at Rest = 95%  Patient Saturations on Room Air while Ambulating = did not attempt 2/2 pt request  Patient Saturations on 5 Liters of oxygen while Ambulating = 85%  Please briefly explain why patient needs home oxygen: Pt desats on 5L O2 when ambulating.    Follow Up Recommendations No PT follow up;Supervision - Intermittent    Equipment Recommendations  None recommended by PT    Recommendations for Other Services       Precautions / Restrictions Precautions Precautions: Fall Restrictions Weight Bearing Restrictions: No      Mobility  Bed Mobility Overal bed mobility:  Independent             General bed mobility comments: Pt able to sit EOB without physical assist. Pt notes that he has been standing to use his urinal independently during his stay at the hospital  Transfers Overall transfer level: Needs assistance   Transfers: Sit to/from Stand Sit to Stand: Supervision         General transfer comment: No physical assist required. supervision for safety  Ambulation/Gait Ambulation/Gait assistance: Supervision Ambulation Distance (Feet): 400 Feet Assistive device: None Gait Pattern/deviations: Step-through pattern;Decreased stride length;Leaning posteriorly Gait velocity: decreased Gait velocity interpretation: Below normal speed for age/gender General Gait Details: Pt desats during ambulation on 4L x1 and on 5Lx1. Slight posterior lean with ambulation, unsure if this was present at baseline.  Stairs            Wheelchair Mobility    Modified Rankin (Stroke Patients Only)       Balance Overall balance assessment: Independent Sitting-balance support: No upper extremity supported;Feet supported Sitting balance-Leahy Scale: Good     Standing balance support: No upper extremity supported;During functional activity Standing balance-Leahy Scale: Fair Standing balance comment: Pt able to stand and ambulate without outside assist but no challenges given                             Pertinent Vitals/Pain Pain Assessment: 0-10 Pain Score: 6  Pain Location: L chest Pain Descriptors / Indicators: Sharp Pain Intervention(s): Limited activity within patient's tolerance;Monitored during session;Repositioned    Home Living Family/patient expects to be discharged to:: Private  residence Living Arrangements: Alone Available Help at Discharge: Family;Available PRN/intermittently (daughter with 2 kids, available prn) Type of Home: House Home Access: Stairs to enter Entrance Stairs-Rails: None Entrance Stairs-Number of  Steps: 4 Home Layout: Two level Home Equipment: Cane - single point      Prior Function Level of Independence: Independent               Hand Dominance   Dominant Hand: Right    Extremity/Trunk Assessment   Upper Extremity Assessment Upper Extremity Assessment: Overall WFL for tasks assessed    Lower Extremity Assessment Lower Extremity Assessment: Overall WFL for tasks assessed       Communication   Communication: No difficulties  Cognition Arousal/Alertness: Awake/alert Behavior During Therapy: WFL for tasks assessed/performed Overall Cognitive Status: Within Functional Limits for tasks assessed                      General Comments      Exercises     Assessment/Plan    PT Assessment Patient needs continued PT services  PT Problem List Decreased activity tolerance;Decreased mobility;Decreased safety awareness;Decreased knowledge of precautions;Pain;Decreased coordination          PT Treatment Interventions Gait training;Stair training;Therapeutic activities;Therapeutic exercise;Balance training;Patient/family education    PT Goals (Current goals can be found in the Care Plan section)  Acute Rehab PT Goals Patient Stated Goal: to go home PT Goal Formulation: With patient Time For Goal Achievement: 01/30/16 Potential to Achieve Goals: Good    Frequency Min 3X/week   Barriers to discharge        Co-evaluation               End of Session Equipment Utilized During Treatment: Gait belt Activity Tolerance: Patient tolerated treatment well Patient left: in chair;with call bell/phone within reach Nurse Communication: Mobility status         Time: 1229-1259 PT Time Calculation (min) (ACUTE ONLY): 30 min   Charges:     PT Treatments $Gait Training: 23-37 mins   PT G Codes:        Gaye Pollack February 15, 2016, 3:07 PM Gaye Pollack, SPT [phone]

## 2016-01-16 NOTE — Progress Notes (Signed)
   Subjective: [name] was seen and evaluated today at bedside. He was sitting up on the edge of the bed and about to eat breakfast. Reports he is feeling better however still complains of cough. Willing to go home today if cough is better controlled. Denied any other complaints.   Objective:  Vital signs in last 24 hours: Vitals:   01/15/16 1446 01/15/16 2055 01/15/16 2153 01/16/16 0602  BP:   127/89 112/84  Pulse:  (!) 101 (!) 106 95  Resp:  18 18 17   Temp:   98.7 F (37.1 C) 99.1 F (37.3 C)  TempSrc:      SpO2: 100% 100% 99% 100%  Weight:      Height:       General: Middle-aged african [place] male, In no acute distress. Resting comfortably on side of bed, on 4L via [place].  HENT: EOMI. No conjunctival injection, icterus or ptosis. Cardiovascular: Regular rate and rhythm. No murmur or rub appreciated. Pulmonary: Diminished breath sounds BL. Unlabored breathing. Able to speak in full sentences.  Abdomen: Soft and non-distended. +bowel sounds.  Skin: Warm, dry. No cyanosis. Psych: Mood normal and affect was mood congruent. Responds to questions appropriately.   Assessment/Plan:  Principal Problem:   Bronchiectasis with acute exacerbation (HCC) Active Problems:   Respiratory distress   Sarcoidosis (HCC)   Hypertension   Chronic respiratory failure (HCC)   Cocaine abuse   OSA on CPAP  Viral Pneumonia in setting of Stage IV Sarcoidosis with Chronic Respiratory Failure Respiratory status improved, back at baseline. Unfortunately, he was ambulated without oxygen and is on oxygen continuously, 3-5L at baseline. This was not an accurate measurement of his saturation after d/c so we have asked them to ambulate the patient again however this time on his home dose of O2. Have added robitussin DM for cough control and reports improvement since this addition.  -Blood cultures negative x2 -Prednisone 40 mg daily - d/c home with taper -PCCM has signed off with agreement of above  treatment plan -presently on [place] - will ambulate patient with pulse ox to see where patient is compared to baseline (baseline 3.5-5L at home) -F/u appt already scheduled with Dr. Delton CoombesByrum 1/11 -Will d/c pt home with 2 Rx for cough suppresion  Dispo: Anticipated discharge today.  [name] Wik, DO 01/16/2016, 7:12 AM Pager: [phone]

## 2016-01-16 NOTE — Progress Notes (Signed)
SATURATION QUALIFICATIONS: (This note is used to comply with regulatory documentation for home oxygen)  Patient Saturations on Room Air at Rest = 95%  Patient Saturations on Room Air while Ambulating = did not attempt 2/2 pt request  Patient Saturations on 5 Liters of oxygen while Ambulating = 85%  Please briefly explain why patient needs home oxygen: Pt desats on 5L O2 when ambulating.   Gaye PollackRebecca Jamiere Gulas, SPT [phone]

## 2016-01-16 NOTE — Progress Notes (Signed)
  Date: 01/16/2016  Patient name: [name]  Medical record number: [id number]  Date of birth: 11/17/1965   I have personally seen and evaluated [name] and the plan of care was discussed with the house staff. Please see Dr. Lana FishMolt's note for complete details. I concur with her findings.  Inez CatalinaEmily B Verdis Bassette, MD 01/16/2016, 4:20 PM

## 2016-01-17 LAB — CULTURE, BLOOD (ROUTINE X 2)
Culture: NO GROWTH
Culture: NO GROWTH

## 2016-01-17 LAB — GLUCOSE, CAPILLARY: Glucose-Capillary: 76 mg/dL (ref 65–99)

## 2016-01-17 MED ORDER — PREDNISONE 10 MG (21) PO TBPK: 10.0000 mg | ORAL_TABLET | ORAL | 0 refills | Status: DC

## 2016-01-17 MED ORDER — GUAIFENESIN-CODEINE 100-10 MG/5ML PO SOLN: 5.0000 mL | Freq: Four times a day (QID) | ORAL | 0 refills | Status: DC

## 2016-01-17 NOTE — Progress Notes (Signed)
   Subjective: [name] was seen and evaluated today at bedside. He was sitting up on the edge of the bed and eating breakfast. Reports he is feeling better however still complains of cough. Feels ready for discharge today.   Objective:  Vital signs in last 24 hours: Vitals:   01/16/16 1214 01/16/16 1529 01/16/16 2013 01/17/16 0700  BP:   (!) 156/101 (!) 151/89  Pulse: (!) 101 (!) 104 (!) 105 (!) 102  Resp: 20 18 20 19   Temp:   98.4 F (36.9 C) 98.3 F (36.8 C)  TempSrc:   Oral Oral  SpO2: 98% 94%  95%  Weight:      Height:       General: Middle-aged african [place] male, In no acute distress. Resting comfortably on side of bed, on 4L via [place]. Good spirits and does not appear uncomfortable.  HENT: EOMI. No conjunctival injection, icterus or ptosis. Cardiovascular: Regular rate and rhythm. No murmur or rub appreciated. Pulmonary: Diminished breath sounds BL. Scattered faint wheezes BL. Unlabored breathing. Able to speak in full sentences.  Abdomen: Soft and non-distended. +bowel sounds.  Skin: Warm, dry. No cyanosis. Psych: Mood normal and affect was mood congruent. Responds to questions appropriately.   Assessment/Plan:  Principal Problem:   Bronchiectasis with acute exacerbation (HCC) Active Problems:   Respiratory distress   Sarcoidosis (HCC)   Hypertension   Chronic respiratory failure (HCC)   Cocaine abuse   OSA on CPAP  Viral Pneumonia in setting of Stage IV Sarcoidosis with Chronic Respiratory Failure Respiratory status improved, back at baseline. He was ambulated yesterday with his home oxygen and dipped to 86%. Pt reports he was walking briskly and having good conversation during that time. He denied feeling symptomatic. Pt has been saturating in mid 90's at rest and suspect it will take some time for his lungs to fully get back to baseline, however, given his severity of underlying pulmonary disease, this could be his BL with moderate activity. Had good relief of  cough with robitussin DM.  -Blood cultures negative x2 -Prednisone 40 mg daily - d/c home with taper -PCCM has signed off with agreement of above treatment plan -On home O2 -F/u appt already scheduled with Dr. Delton CoombesByrum 1/11 -Will d/c pt home with rx for cough suppresion  Dispo: Anticipated discharge today.  Asheton Scheffler, DO 01/17/2016, 11:11 AM Pager: [phone]

## 2016-01-17 NOTE — Discharge Instructions (Signed)
Please take your steroid taper as directed on the bottle. The specific instructions are noted below. Please take the prescribed cough medicine as needed. This should be less than $20 at most pharmacies. Please continue to take your nebulizers and inhalers as scheduled as well as when needed. Please follow up with both your primary care physician and your pulmonologist. Please get rest!

## 2016-01-17 NOTE — Progress Notes (Signed)
Getting pt ready for DC.  Rx for prednisone and Quafenisen/codeine given and explained. DC instructions reviewed and copy given to pt.

## 2016-01-17 NOTE — Discharge Summary (Signed)
Medicine attending discharge note: I personally examined this patient on the day of discharge and I attest to the accuracy of his discharge evaluation and plan as recorded in the final progress note dated 01/17/2016 by resident physician Dr. Toma CopierBethany Molt.  51 year old man with oxygen dependent stage IV sarcoidosis with pulmonary involvement admitted with acute on chronic hypoxic respiratory failure initially requiring BiPAP. Chest x-ray showed bilateral pulmonary scarring with areas of acute consolidation in the right mid lung and left lung base. He had no elevation of his white count. He tested positive for rhinovirus but negative for influenza A and influenza B. This was felt to represent a viral pneumonia.  He was treated symptomatically. He was started on a short course of prednisone. He was followed by our pulmonary critical care physicians. He slowly improved but still has a residual cough. Lungs are overall clear with a prolonged expiratory phase and mild wheezing.  Disposition: Condition stable at time of discharge There were no complications

## 2016-01-19 ENCOUNTER — Encounter: Payer: Self-pay | Admitting: Acute Care

## 2016-01-21 ENCOUNTER — Telehealth: Payer: Self-pay | Admitting: Emergency Medicine

## 2016-01-21 NOTE — Telephone Encounter (Signed)
Spoke with Greggory StallionGeorge, RT at [facility], states that pt needs a qualifying walk to be evaluated for portable 02.  Pt has an appt with RB tomorrow morning, wanted to see if this walk test could be done by [facility]'s RT in our clinic tomorrow morning before or after pt's office visit.  I advised that I would need to speak with my superior to verify as this is not typically our office protocol.  Spoke with Robynn PaneElise regarding this, she states that this would not be appropriate and would interfere with patient/office flow, and needs to be done elsewhere.  lmtcb X1 for Mandy at [facility] to make aware.

## 2016-01-21 NOTE — Telephone Encounter (Signed)
Spoke with Angelica ChessmanMandy at [facility], aware of recs.  Mandy asked if we could do qualifying walk to see if pt tolerates POC in clinic tomorrow, I advised that we do not have pulse dosing 02 in our clinic, which is why we always order POC eval through home care.  mandy expressed understanding.  Nothing further needed at this time.

## 2016-01-21 NOTE — Telephone Encounter (Signed)
Return call from [facility].[name]

## 2016-01-22 ENCOUNTER — Encounter: Payer: Self-pay | Admitting: Emergency Medicine

## 2016-01-22 ENCOUNTER — Ambulatory Visit (INDEPENDENT_AMBULATORY_CARE_PROVIDER_SITE_OTHER): Payer: Commercial Managed Care - HMO | Admitting: Emergency Medicine

## 2016-01-22 DIAGNOSIS — G4733 Obstructive sleep apnea (adult) (pediatric): Secondary | ICD-10-CM | POA: Diagnosis not present

## 2016-01-22 DIAGNOSIS — J471 Bronchiectasis with (acute) exacerbation: Secondary | ICD-10-CM | POA: Diagnosis not present

## 2016-01-22 DIAGNOSIS — Z9989 Dependence on other enabling machines and devices: Secondary | ICD-10-CM

## 2016-01-22 MED ORDER — BUDESONIDE-FORMOTEROL FUMARATE 160-4.5 MCG/ACT IN AERO: 2.0000 | INHALATION_SPRAY | Freq: Two times a day (BID) | RESPIRATORY_TRACT | 5 refills | Status: DC

## 2016-01-22 NOTE — Patient Instructions (Signed)
We will restart Symbicort today. It will be unreliable for you to depend on samples. Take albuterol 2 puffs or 1 nebulizer treatment up to every 4 hours if needed for shortness of breath.  Continue flutter valve, chest vest as you have been doing them Continue Mucinex Using cocaine is contributing to the decline in your breathing and lung function. It make your bronchiectasis harder to manage. You need to stop. Continue CPAP every night. We will write a prescription for a cleaning device for your CPAP machine. Follow with Dr Delton CoombesByrum in 6 months or sooner if you have any problems

## 2016-01-22 NOTE — Assessment & Plan Note (Signed)
With a recent acute exacerbation in the setting of rhinovirus and cocaine use. He has been difficult to manage because he has not been able to afford Symbicort. He is apparently able to afford his cocaine. He needs to get back on his maintenance therapy - I have stressed to him that it is unreliable to depend on samples. He will restart Symbicort today, I will give him samples if we have any. I've encouraged him to use his chest vest and flutter valve, continue Mucinex. As usual I have encouraged that he abstain from cocaine

## 2016-01-22 NOTE — Progress Notes (Signed)
Subjective:    Patient ID: [name], male    DOB: 04/04/1965, 51 y.o.   MRN: [id number]  HPI 51 yo male never smoker with stage IV sarcoidosis and bronchiectasis and chronic respiratory failure with chronic hypoxemia needing oxygen, obstructive sleep apnea needing CPAP. Also cocaine abuse which has contributed to decompensations in his respiratory status. As hospitalized with an acute exacerbation of his bronchiectasis and COPD in the setting of rhinovirus positive (flu negative). His cocaine was positive. He was treated with steroids and abx. He is still finishing the prednisone taper. He has been off Symbicort for over a month, has not been able to afford it, likely corresponds with his decompensation. He currently feels a bit better. His exercise tolerance is improved. He still having difficulty with secretion management but he is not on Symbicort even after the hospitalization.  He has a chest Vest, is maintained on mucinex. Uses albuterol very frequently.             He is using CPAP at night. He wants to get a CPAP equipment cleaner.                                                  Review of Systems As per HPI     Objective:   Physical Exam  Vitals:   01/22/16 1152  BP: 114/74  Pulse: 100  SpO2: 94%  Weight: 167 lb (75.8 kg)  Height: 5' 7.5" (1.715 m)    GEN: A/Ox3; pleasant , NAD   HEENT:  OP clear, no erythema  NECK:  No stridor  RESP  Decreased BS in bases , no accessory muscle use  CARD:  RRR, no m/r/g  , no peripheral edema, pulses intact, no cyanosis or clubbing.  Musco: Warm bil, no deformities or joint swelling noted.   Neuro: alert, no focal deficits noted.    Skin: Warm, no lesions or rashes  04/24/15 --  COMPARISON: January 13, 2015 chest radiograph and chest CT September 30, 2014  FINDINGS: There is widespread fibrotic change throughout the lungs bilaterally with airspace consolidation in both mid lungs and left lower lobe region, stable.  There is scarring in the upper lobes as well as in the right base with chronic lateral basilar pleural thickening. There is a large bulla in the right upper lobe, stable.  No new opacity is evident. The heart size and pulmonary vascularity are normal. No adenopathy is apparent. No bone lesions are evident.  IMPRESSION: Extensive cicatrization as well as areas of patchy airspace consolidation, more severe on the left than on the right, and scarring throughout the lungs bilaterally. No new opacity is evident. No adenopathy. The changes in the lungs are consistent with chronic sarcoidosis.  It should be noted that subtle acute pneumonia could easily be obscured with this degree of underlying parenchymal scarring and fibrotic type change     Assessment & Plan:  OSA on CPAP He is compliant with his CPAP. He is requesting a prescription for a cleaner for his device. Is not clear to me that this will be paid for by insurance but he can use prescription to try.  Bronchiectasis without acute exacerbation With a recent acute exacerbation in the setting of rhinovirus and cocaine use. He has been difficult to manage because he has not been able to afford Symbicort. He is  apparently able to afford his cocaine. He needs to get back on his maintenance therapy - I have stressed to him that it is unreliable to depend on samples. He will restart Symbicort today, I will give him samples if we have any. I've encouraged him to use his chest vest and flutter valve, continue Mucinex. As usual I have encouraged that he abstain from cocaine  Levy Pupa, MD, PhD 01/22/2016, 12:18 PM [facility] [phone] or if no answer [phone]

## 2016-01-22 NOTE — Addendum Note (Signed)
Addended by: Jaynee EaglesLEMONS, Marwan Lipe C on: 01/22/2016 12:32 PM   Modules accepted: Orders

## 2016-01-22 NOTE — Assessment & Plan Note (Signed)
He is compliant with his CPAP. He is requesting a prescription for a cleaner for his device. Is not clear to me that this will be paid for by insurance but he can use prescription to try.

## 2016-03-25 IMAGING — CR DG CHEST 2V
2 series · 2 of 2 positions shown · non-contrast
Comparison: 10/02/2014

CLINICAL DATA: Worsening shortness of breath and productive cough
in a patient with sarcoidosis.

EXAM:
CHEST  2 VIEW

[w chest pa]
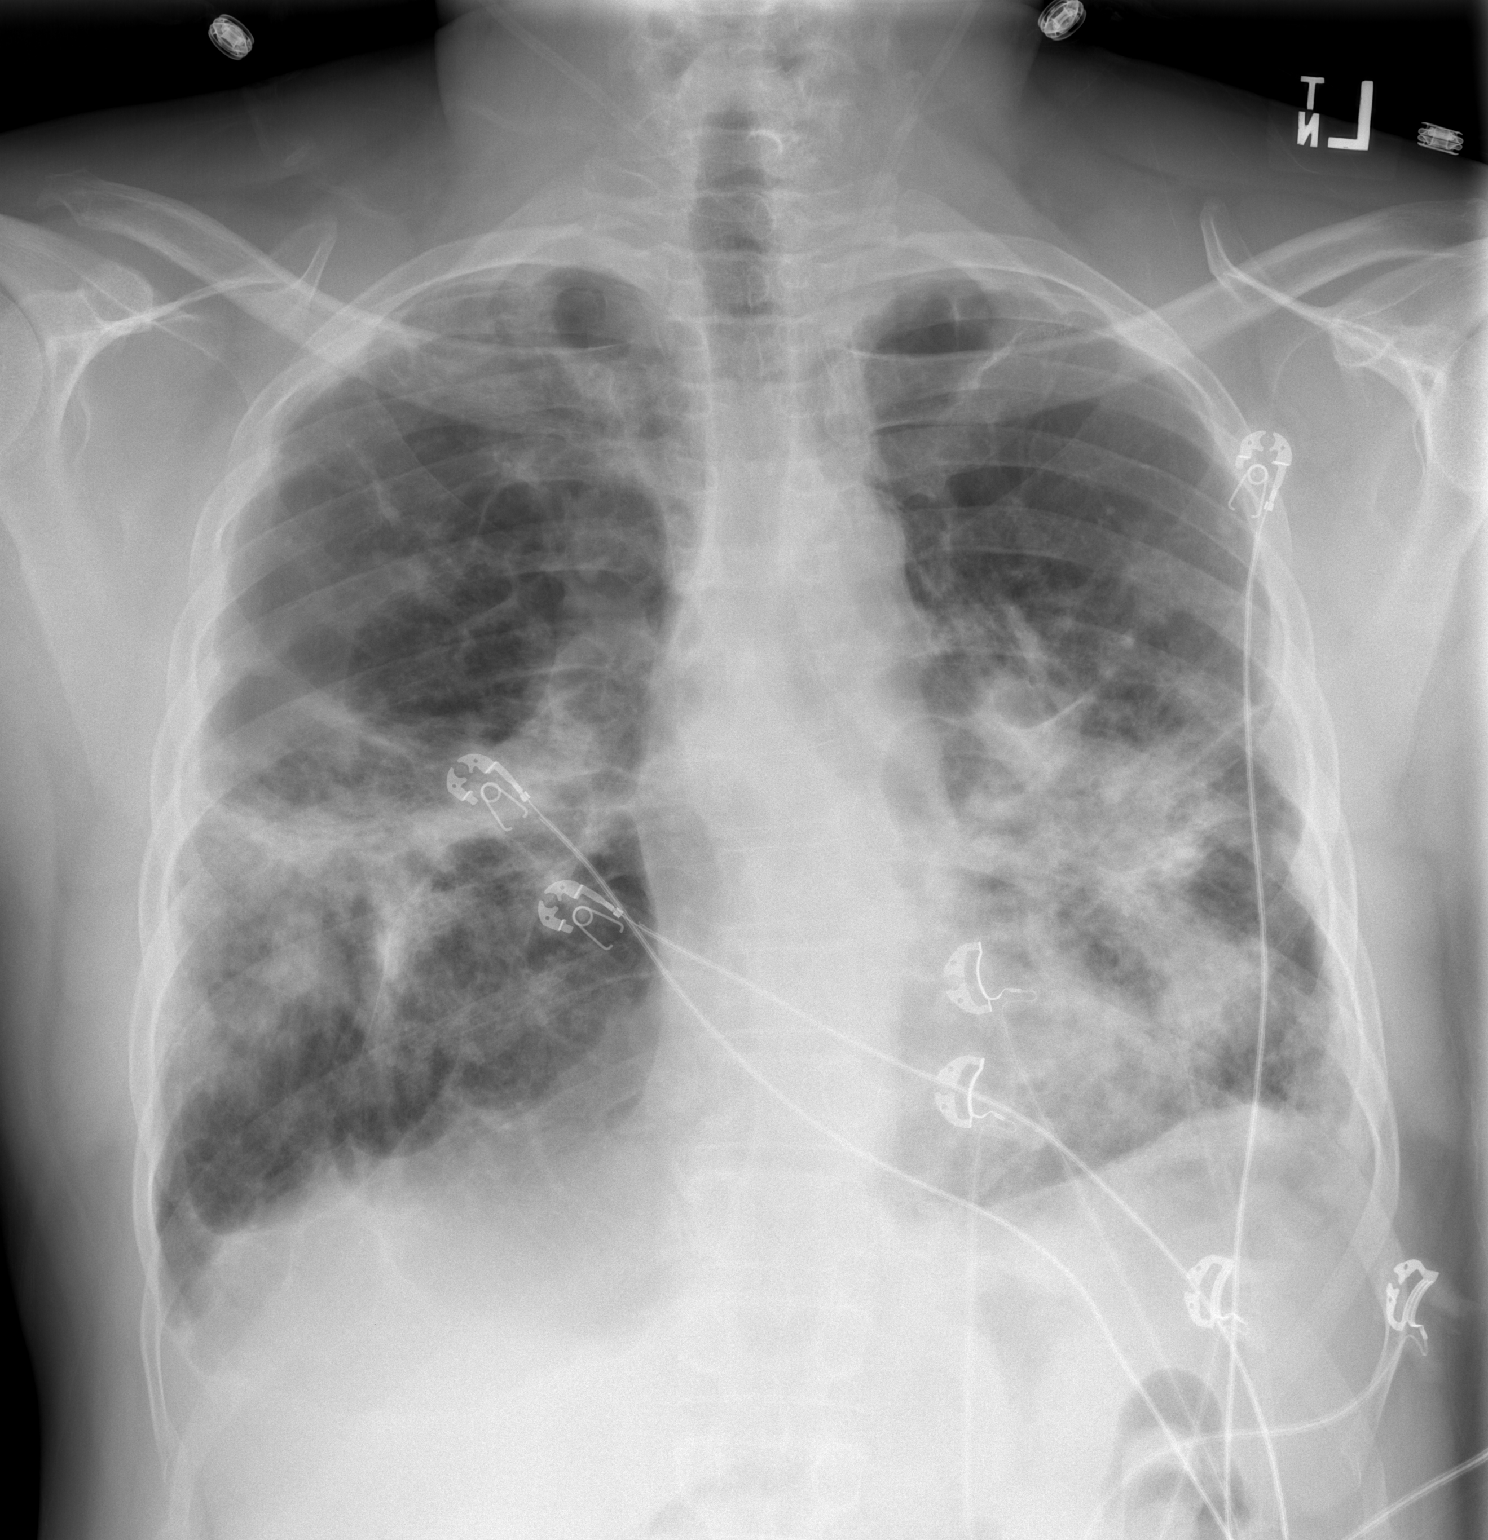

[w chest lat]
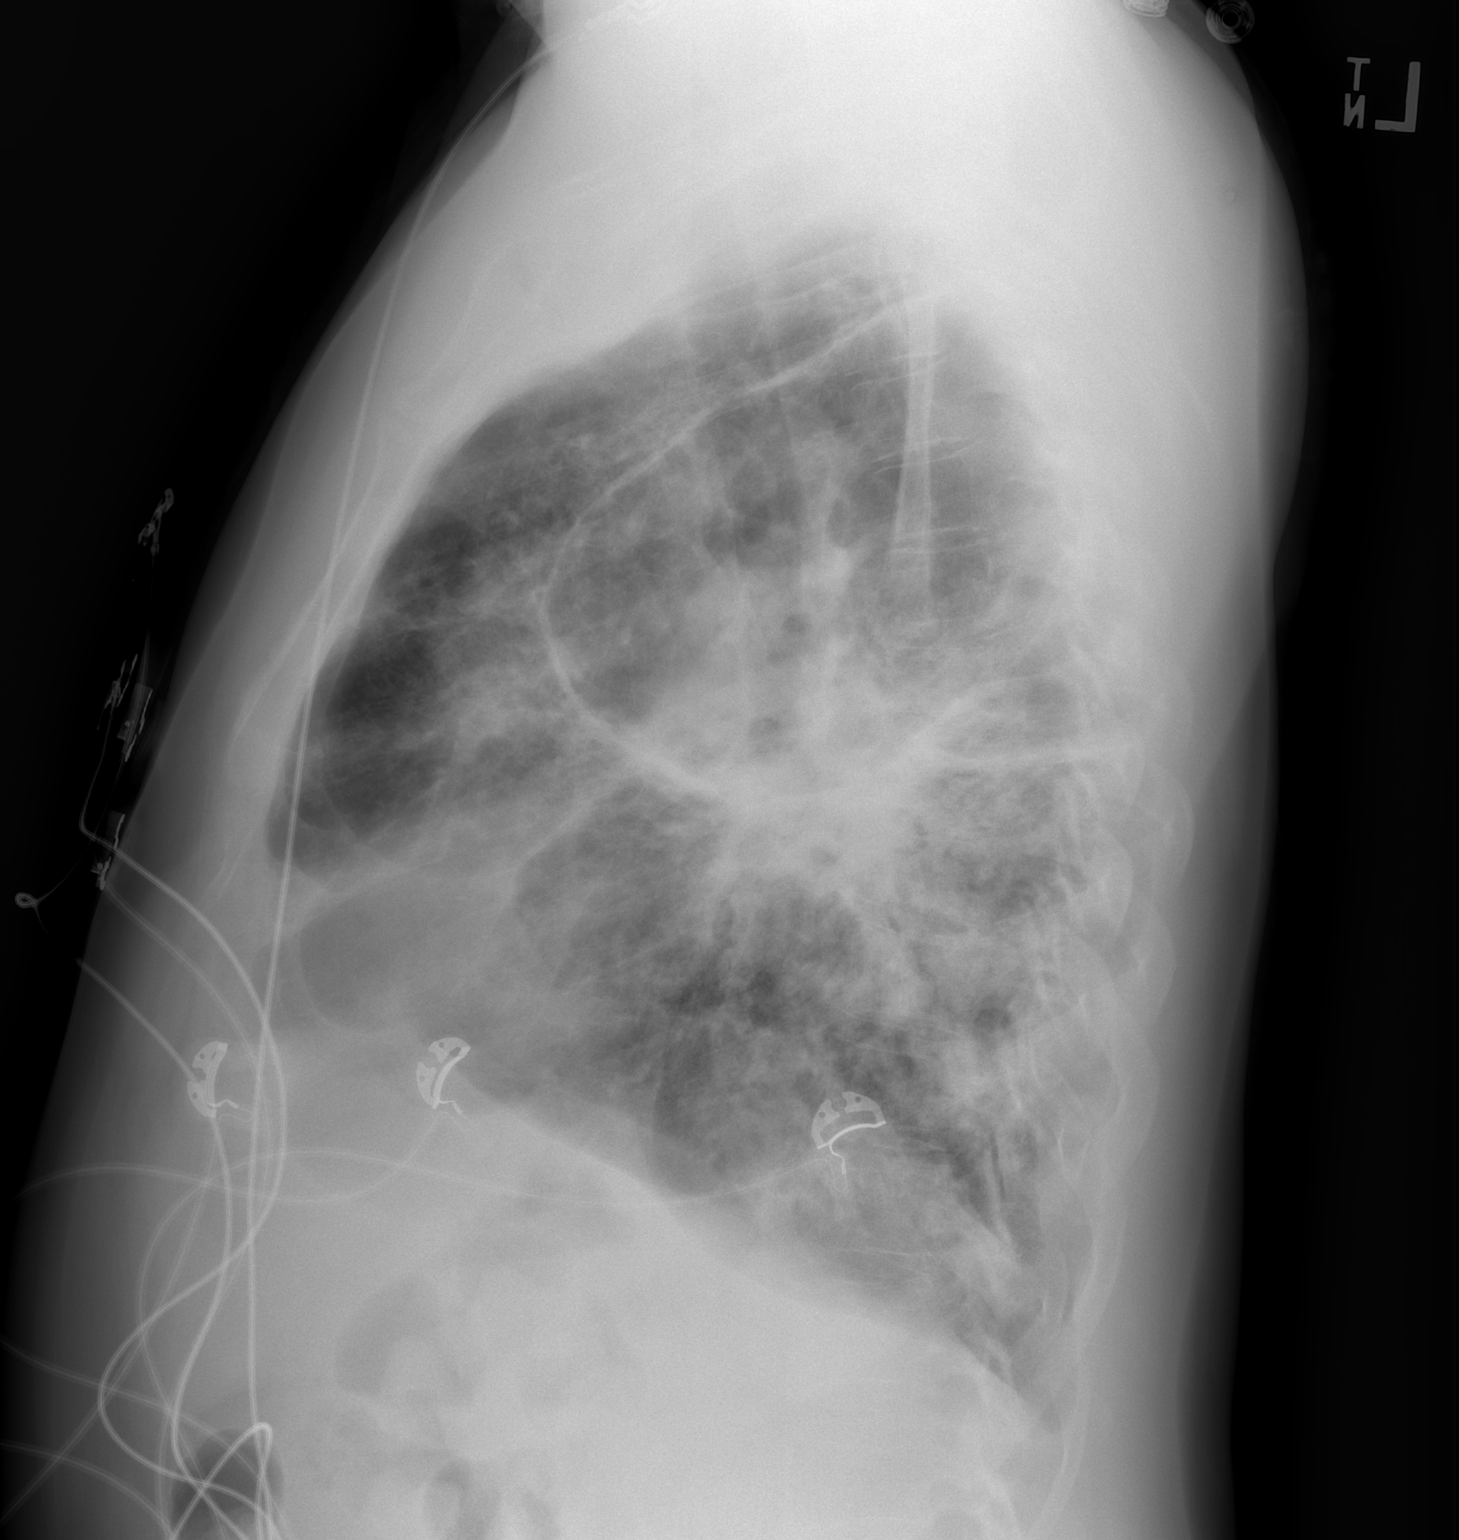

[2 of 2 positions shown; findings below may reference images not displayed]

FINDINGS: Stable bilateral pleural-parenchymal opacity compatible with
reported history of sarcoidosis. Areas of more focal wall
progressive opacity are seen in the left mid lung and peripheral
right lower lung. The cardiopericardial silhouette is within normal
limits for size. The visualized bony structures of the thorax are
intact. Telemetry leads overlie the chest.
IMPRESSION: Interval progression of bilateral ill-defined airspace opacities.

## 2016-04-16 ENCOUNTER — Emergency Department (HOSPITAL_COMMUNITY): Payer: Commercial Managed Care - HMO

## 2016-04-16 ENCOUNTER — Inpatient Hospital Stay (HOSPITAL_COMMUNITY)
Admission: EM | Admit: 2016-04-16 | Discharge: 2016-04-19 | DRG: 189 | Disposition: A | Discharge status: Home / Self Care | Payer: Commercial Managed Care - HMO | Attending: Internal Medicine | Admitting: Internal Medicine

## 2016-04-16 ENCOUNTER — Encounter (HOSPITAL_COMMUNITY): Payer: Self-pay | Admitting: Internal Medicine

## 2016-04-16 DIAGNOSIS — Z9989 Dependence on other enabling machines and devices: Secondary | ICD-10-CM

## 2016-04-16 DIAGNOSIS — J441 Chronic obstructive pulmonary disease with (acute) exacerbation: Secondary | ICD-10-CM | POA: Diagnosis not present

## 2016-04-16 DIAGNOSIS — G4733 Obstructive sleep apnea (adult) (pediatric): Secondary | ICD-10-CM

## 2016-04-16 DIAGNOSIS — Z833 Family history of diabetes mellitus: Secondary | ICD-10-CM | POA: Diagnosis not present

## 2016-04-16 DIAGNOSIS — J189 Pneumonia, unspecified organism: Secondary | ICD-10-CM | POA: Diagnosis present

## 2016-04-16 DIAGNOSIS — I10 Essential (primary) hypertension: Secondary | ICD-10-CM

## 2016-04-16 DIAGNOSIS — R0602 Shortness of breath: Secondary | ICD-10-CM

## 2016-04-16 DIAGNOSIS — Z7951 Long term (current) use of inhaled steroids: Secondary | ICD-10-CM | POA: Diagnosis not present

## 2016-04-16 DIAGNOSIS — D869 Sarcoidosis, unspecified: Secondary | ICD-10-CM

## 2016-04-16 DIAGNOSIS — Z79899 Other long term (current) drug therapy: Secondary | ICD-10-CM | POA: Diagnosis not present

## 2016-04-16 DIAGNOSIS — J9621 Acute and chronic respiratory failure with hypoxia: Principal | ICD-10-CM | POA: Diagnosis present

## 2016-04-16 DIAGNOSIS — Z9981 Dependence on supplemental oxygen: Secondary | ICD-10-CM

## 2016-04-16 DIAGNOSIS — J9600 Acute respiratory failure, unspecified whether with hypoxia or hypercapnia: Secondary | ICD-10-CM | POA: Diagnosis present

## 2016-04-16 DIAGNOSIS — I509 Heart failure, unspecified: Secondary | ICD-10-CM | POA: Diagnosis present

## 2016-04-16 DIAGNOSIS — I110 Hypertensive heart disease with heart failure: Secondary | ICD-10-CM | POA: Diagnosis present

## 2016-04-16 DIAGNOSIS — D860 Sarcoidosis of lung: Secondary | ICD-10-CM | POA: Diagnosis present

## 2016-04-16 LAB — RESPIRATORY PANEL BY PCR
Adenovirus: NOT DETECTED
BORDETELLA PERTUSSIS-RVPCR: NOT DETECTED
CORONAVIRUS OC43-RVPPCR: NOT DETECTED
Chlamydophila pneumoniae: NOT DETECTED
Coronavirus 229E: NOT DETECTED
Coronavirus HKU1: NOT DETECTED
Coronavirus NL63: NOT DETECTED
INFLUENZA A-RVPPCR: NOT DETECTED
INFLUENZA B-RVPPCR: NOT DETECTED
METAPNEUMOVIRUS-RVPPCR: NOT DETECTED
Mycoplasma pneumoniae: NOT DETECTED
PARAINFLUENZA VIRUS 1-RVPPCR: NOT DETECTED
PARAINFLUENZA VIRUS 2-RVPPCR: NOT DETECTED
PARAINFLUENZA VIRUS 3-RVPPCR: NOT DETECTED
PARAINFLUENZA VIRUS 4-RVPPCR: NOT DETECTED
RESPIRATORY SYNCYTIAL VIRUS-RVPPCR: NOT DETECTED
RHINOVIRUS / ENTEROVIRUS - RVPPCR: NOT DETECTED

## 2016-04-16 LAB — CBC
HCT: 38.6 % — ABNORMAL LOW (ref 39.0–52.0)
HEMOGLOBIN: 12.9 g/dL — AB (ref 13.0–17.0)
MCH: 27.7 pg (ref 26.0–34.0)
MCHC: 33.4 g/dL (ref 30.0–36.0)
MCV: 82.8 fL (ref 78.0–100.0)
Platelets: 272 10*3/uL (ref 150–400)
RBC: 4.66 MIL/uL (ref 4.22–5.81)
RDW: 12.6 % (ref 11.5–15.5)
WBC: 7.1 10*3/uL (ref 4.0–10.5)

## 2016-04-16 LAB — BASIC METABOLIC PANEL
ANION GAP: 6 (ref 5–15)
BUN: 7 mg/dL (ref 6–20)
CO2: 31 mmol/L (ref 22–32)
Calcium: 9.5 mg/dL (ref 8.9–10.3)
Chloride: 100 mmol/L — ABNORMAL LOW (ref 101–111)
Creatinine, Ser: 0.92 mg/dL (ref 0.61–1.24)
GFR calc Af Amer: >60 mL/min (ref >60)
GFR calc non Af Amer: >60 mL/min (ref >60)
GLUCOSE: 113 mg/dL — AB (ref 65–99)
POTASSIUM: 4.1 mmol/L (ref 3.5–5.1)
Sodium: 137 mmol/L (ref 135–145)

## 2016-04-16 LAB — BRAIN NATRIURETIC PEPTIDE: B NATRIURETIC PEPTIDE 5: 69.8 pg/mL (ref 0.0–100.0)

## 2016-04-16 LAB — TROPONIN I: Troponin I: <0.03 ng/mL (ref <0.03)

## 2016-04-16 LAB — RAPID URINE DRUG SCREEN, HOSP PERFORMED
AMPHETAMINES: NOT DETECTED
BENZODIAZEPINES: NOT DETECTED
Barbiturates: NOT DETECTED
COCAINE: POSITIVE — AB
Opiates: POSITIVE — AB
Tetrahydrocannabinol: NOT DETECTED

## 2016-04-16 LAB — INFLUENZA PANEL BY PCR (TYPE A & B)
INFLAPCR: NEGATIVE
Influenza B By PCR: NEGATIVE

## 2016-04-16 LAB — MRSA PCR SCREENING: MRSA by PCR: NEGATIVE

## 2016-04-16 LAB — D-DIMER, QUANTITATIVE: D-Dimer, Quant: 2.37 ug/mL-FEU — ABNORMAL HIGH (ref 0.00–0.50)

## 2016-04-16 MED ORDER — HYDRALAZINE HCL 20 MG/ML IJ SOLN
10.0000 mg | INTRAMUSCULAR | Status: DC | PRN
  Administered 2016-04-16: 10 mg via INTRAVENOUS
  Filled 2016-04-16: qty 1

## 2016-04-16 MED ORDER — LEVOFLOXACIN IN D5W 750 MG/150ML IV SOLN
750.0000 mg | Freq: Every day | INTRAVENOUS | Status: DC
  Administered 2016-04-16 – 2016-04-19 (×4): 750 mg via INTRAVENOUS
  Filled 2016-04-16 (×4): qty 150

## 2016-04-16 MED ORDER — FERROUS SULFATE 325 (65 FE) MG PO TABS
325.0000 mg | ORAL_TABLET | Freq: Two times a day (BID) | ORAL | Status: DC
  Administered 2016-04-16 – 2016-04-19 (×7): 325 mg via ORAL
  Filled 2016-04-16 (×7): qty 1

## 2016-04-16 MED ORDER — ALBUTEROL SULFATE (2.5 MG/3ML) 0.083% IN NEBU
5.0000 mg | INHALATION_SOLUTION | Freq: Once | RESPIRATORY_TRACT | Status: AC
  Administered 2016-04-16: 5 mg via RESPIRATORY_TRACT
  Filled 2016-04-16: qty 6

## 2016-04-16 MED ORDER — BUDESONIDE 0.5 MG/2ML IN SUSP
0.5000 mg | Freq: Two times a day (BID) | RESPIRATORY_TRACT | Status: DC
Start: 2016-04-16 — End: 2016-04-19
  Administered 2016-04-16 – 2016-04-19 (×6): 0.5 mg via RESPIRATORY_TRACT
  Filled 2016-04-16 (×6): qty 2

## 2016-04-16 MED ORDER — ONDANSETRON HCL 4 MG PO TABS: 4.0000 mg | ORAL_TABLET | Freq: Four times a day (QID) | ORAL | Status: DC | PRN

## 2016-04-16 MED ORDER — ENOXAPARIN SODIUM 40 MG/0.4ML ~~LOC~~ SOLN
40.0000 mg | SUBCUTANEOUS | Status: DC
  Administered 2016-04-16 – 2016-04-19 (×4): 40 mg via SUBCUTANEOUS
  Filled 2016-04-16 (×3): qty 0.4

## 2016-04-16 MED ORDER — POLYETHYLENE GLYCOL 3350 17 G PO PACK: 17.0000 g | PACK | Freq: Every day | ORAL | Status: DC | PRN

## 2016-04-16 MED ORDER — CYCLOBENZAPRINE HCL 10 MG PO TABS
10.0000 mg | ORAL_TABLET | Freq: Three times a day (TID) | ORAL | Status: DC | PRN
  Administered 2016-04-16: 10 mg via ORAL
  Filled 2016-04-16: qty 1

## 2016-04-16 MED ORDER — BISACODYL 5 MG PO TBEC
5.0000 mg | DELAYED_RELEASE_TABLET | Freq: Every day | ORAL | Status: DC | PRN
  Filled 2016-04-16: qty 1

## 2016-04-16 MED ORDER — PANTOPRAZOLE SODIUM 40 MG PO TBEC
40.0000 mg | DELAYED_RELEASE_TABLET | Freq: Every day | ORAL | Status: DC
  Administered 2016-04-16 – 2016-04-19 (×4): 40 mg via ORAL
  Filled 2016-04-16 (×4): qty 1

## 2016-04-16 MED ORDER — ACETAMINOPHEN 650 MG RE SUPP: 650.0000 mg | Freq: Four times a day (QID) | RECTAL | Status: DC | PRN

## 2016-04-16 MED ORDER — DILTIAZEM HCL ER COATED BEADS 120 MG PO CP24
120.0000 mg | ORAL_CAPSULE | Freq: Every day | ORAL | Status: DC
Start: 2016-04-16 — End: 2016-04-18
  Administered 2016-04-16 – 2016-04-17 (×2): 120 mg via ORAL
  Filled 2016-04-16 (×2): qty 1

## 2016-04-16 MED ORDER — BISOPROLOL FUMARATE 5 MG PO TABS
10.0000 mg | ORAL_TABLET | Freq: Every day | ORAL | Status: DC
  Administered 2016-04-16 – 2016-04-17 (×2): 10 mg via ORAL
  Filled 2016-04-16 (×2): qty 2

## 2016-04-16 MED ORDER — IPRATROPIUM-ALBUTEROL 0.5-2.5 (3) MG/3ML IN SOLN
3.0000 mL | RESPIRATORY_TRACT | Status: DC | PRN
  Filled 2016-04-16 (×2): qty 3

## 2016-04-16 MED ORDER — ONDANSETRON HCL 4 MG/2ML IJ SOLN: 4.0000 mg | Freq: Four times a day (QID) | INTRAMUSCULAR | Status: DC | PRN

## 2016-04-16 MED ORDER — FENTANYL CITRATE (PF) 100 MCG/2ML IJ SOLN
50.0000 ug | Freq: Once | INTRAMUSCULAR | Status: AC
  Administered 2016-04-16: 50 ug via INTRAVENOUS
  Filled 2016-04-16: qty 2

## 2016-04-16 MED ORDER — BUDESONIDE 0.25 MG/2ML IN SUSP
RESPIRATORY_TRACT | Status: AC
  Filled 2016-04-16: qty 2

## 2016-04-16 MED ORDER — METHYLPREDNISOLONE SODIUM SUCC 125 MG IJ SOLR
125.0000 mg | Freq: Once | INTRAMUSCULAR | Status: AC
  Administered 2016-04-16: 125 mg via INTRAVENOUS
  Filled 2016-04-16: qty 2

## 2016-04-16 MED ORDER — GUAIFENESIN ER 600 MG PO TB12
600.0000 mg | ORAL_TABLET | Freq: Two times a day (BID) | ORAL | Status: DC | PRN
  Administered 2016-04-16 – 2016-04-18 (×2): 600 mg via ORAL
  Filled 2016-04-16 (×2): qty 1

## 2016-04-16 MED ORDER — CALCIUM CARBONATE-VITAMIN D 500-200 MG-UNIT PO TABS
1.0000 | ORAL_TABLET | Freq: Every day | ORAL | Status: DC
  Administered 2016-04-16 – 2016-04-19 (×4): 1 via ORAL
  Filled 2016-04-16 (×4): qty 1

## 2016-04-16 MED ORDER — ACETAMINOPHEN 325 MG PO TABS
650.0000 mg | ORAL_TABLET | Freq: Four times a day (QID) | ORAL | Status: DC | PRN
  Administered 2016-04-16: 650 mg via ORAL
  Filled 2016-04-16: qty 2

## 2016-04-16 MED ORDER — IPRATROPIUM-ALBUTEROL 0.5-2.5 (3) MG/3ML IN SOLN
3.0000 mL | RESPIRATORY_TRACT | Status: DC
  Administered 2016-04-16 (×5): 3 mL via RESPIRATORY_TRACT
  Filled 2016-04-16 (×5): qty 3

## 2016-04-16 MED ORDER — BUDESONIDE 0.25 MG/2ML IN SUSP
0.2500 mg | Freq: Two times a day (BID) | RESPIRATORY_TRACT | Status: DC
  Administered 2016-04-16: 0.25 mg via RESPIRATORY_TRACT

## 2016-04-16 MED ORDER — BISOPROLOL FUMARATE 5 MG PO TABS
10.0000 mg | ORAL_TABLET | Freq: Once | ORAL | Status: AC
  Administered 2016-04-16: 10 mg via ORAL
  Filled 2016-04-16: qty 1
  Filled 2016-04-16: qty 2

## 2016-04-16 MED ORDER — ADULT MULTIVITAMIN W/MINERALS CH
1.0000 | ORAL_TABLET | Freq: Every day | ORAL | Status: DC
  Administered 2016-04-16 – 2016-04-19 (×4): 1 via ORAL
  Filled 2016-04-16 (×4): qty 1

## 2016-04-16 MED ORDER — METHYLPREDNISOLONE SODIUM SUCC 40 MG IJ SOLR
40.0000 mg | Freq: Two times a day (BID) | INTRAMUSCULAR | Status: DC
  Administered 2016-04-16 – 2016-04-19 (×6): 40 mg via INTRAVENOUS
  Filled 2016-04-16 (×6): qty 1

## 2016-04-16 NOTE — ED Notes (Signed)
Bed: ZO10 Expected date:  Expected time:  Means of arrival:  Comments: Hold for [place]

## 2016-04-16 NOTE — Progress Notes (Signed)
Patient was seen on rounds chart reviewed.Medicationsreviewed. Admitting H&P reviewed reviewed and  noted.  51 year old with sarcoidosis, bullous disease (never smoked cigarettes), sleep apnea with chronic respiratory failure on 5 L oxygen at home, presenting with 1 week history of shortness of breath without airspace disease on x-ray of the chest.  Admitted for acute on chronic respiratory failure with possible bronchitis in the setting of sarcoidosis. He's been treated for COPD with IV antibiotics/steroids with bronchodilators  Hemodynamically stable, alert and responsive and acute distress, or wheezing on exam. Labs noted -of note screen positive for cocaine and benzos- avoid narcotics/ beta-blockade Drug abuse counselling

## 2016-04-16 NOTE — ED Notes (Signed)
Rt to do breathing tx

## 2016-04-16 NOTE — ED Provider Notes (Signed)
[facility]-EMERGENCY DEPT Provider Note: [name] Dell, MD, FACEP  CSN: [id number] MRN: [id number] ARRIVAL: 04/16/16 at 0019 ROOM: WA11/WA11   CHIEF COMPLAINT  Shortness of Breath   HISTORY OF PRESENT ILLNESS  [name] is a 51 y.o. male with a history of sarcoid. He is here with a one-week history of worsening shortness of breath. The shortness of breath has gotten severe enough that he is not able to perform activities of daily living. Even minimal activity overexerts him and he is also having dyspnea at rest. He denies fever. He is having right-sided lower chest wall pain with breathing or cough. He is on oxygen 4 liters by nasal cannula around the clock at home. On arrival he was noted to be moving little air and was given 2 albuterol treatments by respiratory therapy with slight improvement. They noted accessory muscle use which is improved. He denies fever.   Past Medical History:  Diagnosis Date  . Bronchitis   . CHF (congestive heart failure) (HCC)   . Chronic respiratory failure (HCC)    home O@ 4L (01/28/14)  . Chronic respiratory failure (HCC)   . COPD (chronic obstructive pulmonary disease) (HCC)   . Hypertension   . Neuromuscular disorder (HCC)   . OSA on CPAP   . Pneumonia   . Sarcoidosis [facility])     Past Surgical History:  Procedure Laterality Date  . arm surgery     . ROTATOR CUFF REPAIR    . VIDEO ASSISTED THORACOSCOPY (VATS)/THOROCOTOMY Left 07/17/2012   Procedure: VIDEO ASSISTED THORACOSCOPY (VATS)/BLEB Stapling;  Surgeon: Alleen Borne, MD;  Location: [facility] OR;  Service: Thoracic;  Laterality: Left;    Family History  Problem Relation Age of Onset  . Diabetes Father     Social History  Substance Use Topics  . Smoking status: Unknown If Ever Smoked  . Smokeless tobacco: Never Used  . Alcohol use Yes     Comment: social    Prior to Admission medications   Medication Sig Start Date End Date Taking? Authorizing Provider  albuterol (PROVENTIL HFA;VENTOLIN  HFA) 108 (90 Base) MCG/ACT inhaler Inhale 2 puffs into the lungs every 6 (six) hours as needed for wheezing or shortness of breath.   Yes Historical Provider, MD  albuterol (PROVENTIL) (2.5 MG/3ML) 0.083% nebulizer solution Take 2.5 mg by nebulization every 4 (four) hours as needed for wheezing or shortness of breath (wheezing and shortness of breath).  06/08/12  Yes Historical Provider, MD  bisacodyl (DULCOLAX) 5 MG EC tablet Take 5 mg by mouth daily as needed for moderate constipation.   Yes Historical Provider, MD  bisoprolol (ZEBETA) 10 MG tablet Take 10 mg by mouth at bedtime.   Yes Historical Provider, MD  calcium-vitamin D (OSCAL WITH D) 500-200 MG-UNIT tablet Take 1 tablet by mouth daily.   Yes Historical Provider, MD  cyclobenzaprine (FLEXERIL) 10 MG tablet Take 10 mg by mouth 3 (three) times daily as needed for muscle spasms.   Yes Historical Provider, MD  diltiazem (CARTIA XT) 120 MG 24 hr capsule Take 120 mg by mouth daily. 10/13/15  Yes Historical Provider, MD  diphenhydrAMINE (BENADRYL) 25 MG tablet Take 25 mg by mouth every 6 (six) hours as needed for itching or allergies.   Yes Historical Provider, MD  Ergocalciferol (VITAMIN D2) 400 units TABS Take 400 Units by mouth daily. 03/18/08  Yes Historical Provider, MD  ferrous sulfate 325 (65 FE) MG tablet Take 325 mg by mouth 2 (two) times daily with a  meal.   Yes Historical Provider, MD  guaiFENesin (MUCINEX) 600 MG 12 hr tablet Take 600 mg by mouth 2 (two) times daily as needed for cough or to loosen phlegm.   Yes Historical Provider, MD  magnesium citrate SOLN Take 1 Bottle by mouth daily as needed for mild constipation, moderate constipation or severe constipation.    Yes Historical Provider, MD  Multiple Vitamins-Minerals (MULTIVITAMIN WITH MINERALS) tablet Take 1 tablet by mouth every morning.    Yes Historical Provider, MD  omeprazole (PRILOSEC) 40 MG capsule Take 40 mg by mouth 2 (two) times daily.   Yes Historical Provider, MD    ondansetron (ZOFRAN) 4 MG tablet Take 4 mg by mouth every 8 (eight) hours as needed for nausea or vomiting.   Yes Historical Provider, MD  polyethylene glycol (MIRALAX / GLYCOLAX) packet Take 17 g by mouth daily as needed (constipation).   Yes Historical Provider, MD  saline (AYR) GEL Place 1 application into the nose every 4 (four) hours as needed (dryness/irritation).   Yes Historical Provider, MD  SYMBICORT 160-4.5 MCG/ACT inhaler Inhale 1 puff into the lungs 2 (two) times daily. 03/15/16  Yes Historical Provider, MD  OXYGEN Inhale 4 L into the lungs continuous.    Historical Provider, MD    Allergies Clindamycin/lincomycin and Clindamycin/lincomycin   REVIEW OF SYSTEMS  Negative except as noted here or in the History of Present Illness.   PHYSICAL EXAMINATION  Initial Vital Signs Blood pressure (!) 136/99, pulse (!) 102, temperature 98 F (36.7 C), temperature source Oral, resp. rate 13, SpO2 100 %.  Examination General: Well-developed, well-nourished male in no acute distress; appearance consistent with age of record HENT: normocephalic; atraumatic Eyes: pupils equal, round and reactive to light; extraocular muscles intact Neck: supple Heart: regular rate and rhythm Lungs: Decreased air movement bilaterally; faint inspiratory neck sprain wheezes Abdomen: soft; nondistended; nontender; bowel sounds present Extremities: No deformity; full range of motion; pulses normal Neurologic: Awake, alert and oriented; motor function intact in all extremities and symmetric; no facial droop Skin: Warm and dry Psychiatric: Normal mood and affect   RESULTS  Summary of this visit's results, reviewed by myself:   EKG Interpretation  Date/Time:  Friday April 16 2016 01:41:25 EDT Ventricular Rate:  105 PR Interval:    QRS Duration: 83 QT Interval:  355 QTC Calculation: 470 R Axis:   96 Text Interpretation:  Sinus tachycardia Borderline right axis deviation Artifact No significant change  was found Confirmed by Read Drivers  MD, Jonny Ruiz ([id number]) on 04/16/2016 4:47:59 AM Also confirmed by Read Drivers  MD, Jonny Ruiz ([id number]), editor [place]  CCT, BEVERLY ([id number])  on 04/16/2016 6:50:04 AM      Laboratory Studies: Results for orders placed or performed during the hospital encounter of 04/16/16 (from the past 24 hour(s))  Basic metabolic panel     Status: Abnormal   Collection Time: 04/16/16  1:26 AM  Result Value Ref Range   Sodium 137 135 - 145 mmol/L   Potassium 4.1 3.5 - 5.1 mmol/L   Chloride 100 (L) 101 - 111 mmol/L   CO2 31 22 - 32 mmol/L   Glucose, Bld 113 (H) 65 - 99 mg/dL   BUN 7 6 - 20 mg/dL   Creatinine, Ser 6.21 0.61 - 1.24 mg/dL   Calcium 9.5 8.9 - 30.8 mg/dL   GFR calc non Af Amer >60 >60 mL/min   GFR calc Af Amer >60 >60 mL/min   Anion gap 6 5 - 15  CBC  Status: Abnormal   Collection Time: 04/16/16  1:26 AM  Result Value Ref Range   WBC 7.1 4.0 - 10.5 K/uL   RBC 4.66 4.22 - 5.81 MIL/uL   Hemoglobin 12.9 (L) 13.0 - 17.0 g/dL   HCT 16.1 (L) 09.6 - 04.5 %   MCV 82.8 78.0 - 100.0 fL   MCH 27.7 26.0 - 34.0 pg   MCHC 33.4 30.0 - 36.0 g/dL   RDW 40.9 81.1 - 91.4 %   Platelets 272 150 - 400 K/uL   Imaging Studies: Dg Chest 2 View  Result Date: 04/16/2016 CLINICAL DATA:  Worsening dyspnea over the past 3 days, much worse tonight. EXAM: CHEST  2 VIEW COMPARISON:  11/24/2015 FINDINGS: Severe chronic fibrotic changes consistent with the described history of sarcoidosis. No superimposed acute consolidation. No effusion. Unchanged hilar and mediastinal contours. IMPRESSION: Severe chronic fibrotic changes consistent with sarcoidosis. No superimposed acute consolidation or effusion. Electronically Signed   By: Ellery Plunk M.D.   On: 04/16/2016 01:35   Hospitalist to admit.  ED COURSE  Nursing notes and initial vitals signs, including pulse oximetry, reviewed.  Vitals:   04/16/16 0615 04/16/16 0630 04/16/16 0645 04/16/16 0700  BP: (!) 152/110 (!) 146/119 (!) 139/111 (!)  136/99  Pulse: 100 99 (!) 102   Resp: Temp:      TempSrc:      SpO2: 100% 100% 100%     PROCEDURES    ED DIAGNOSES     ICD-9-CM ICD-10-CM   1. Shortness of breath 786.05 R06.02   2. Sarcoidosis of lung [facility]) 135 D86.0    517.8         Paula Libra, MD 04/16/16 [phone]

## 2016-04-16 NOTE — Progress Notes (Signed)
CSW met with pt to complete an assessment and asked if the pt would like to receive information on outpatient substance abuse/use treatment programs and pt replied, "You've got the wrong guy, I don't need any of that".  CSW asked again and pt stated no, he doesn't need anything from social work.  CSW asked pt if there was anything else he needed and pt replied no.  Pt presented as agitated but polite. CSW thanked the pt and left the room.  Alphonse Guild. Arel Tippen, Latanya Presser, LCAS Clinical Social Worker Ph: [phone]

## 2016-04-16 NOTE — Consult Note (Signed)
Name: [name] MRN: [id number] DOB: Jan 13, 1965    ADMISSION DATE:  04/16/2016 CONSULTATION DATE:  04/16/16  REFERRING MD :  Dr. Julio Sicks   CHIEF COMPLAINT:  Shortness of breath    HISTORY OF PRESENT ILLNESS:  51 y/o M, well known to PCCM service (followed by Dr. Delton Coombes) with a PMH of chronic hypoxemic respiratory failure on 5L O2 at baseline, OSA on CPAP, stage IV sarcoidosis, bronchiectasis (chest vest) and prior cocaine abuse with frequent admissions who presented to [facility] on 4/6 with reports of increasing shortness of breath.    He reports he was in his usual state of health until 4/2 when he began developing worsening shortness of breath & increased cough with sputum production.  He reports he ran out of his nebulized medication and was having to "stretch them out" and had called for refills but had not gotten a response.  He has also noted a change in the color of his sputum to dark yellow.  He denies fevers, chills, nausea/vomiting.  Endorses lightheadedness.    PCCM consulted for evaluation of dyspnea.   PAST MEDICAL HISTORY :   has a past medical history of Bronchitis; CHF (congestive heart failure) (HCC); Chronic respiratory failure (HCC); Chronic respiratory failure (HCC); COPD (chronic obstructive pulmonary disease) (HCC); Hypertension; Neuromuscular disorder (HCC); OSA on CPAP; Pneumonia; and Sarcoidosis (HCC).   has a past surgical history that includes Rotator cuff repair; arm surgery ; and Video assisted thoracoscopy (vats)/thorocotomy (Left, 07/17/2012).  Prior to Admission medications   Medication Sig Start Date End Date Taking? Authorizing Provider  albuterol (PROVENTIL HFA;VENTOLIN HFA) 108 (90 Base) MCG/ACT inhaler Inhale 2 puffs into the lungs every 6 (six) hours as needed for wheezing or shortness of breath.   Yes Historical Provider, MD  albuterol (PROVENTIL) (2.5 MG/3ML) 0.083% nebulizer solution Take 2.5 mg by nebulization every 4 (four) hours as needed for wheezing  or shortness of breath (wheezing and shortness of breath).  06/08/12  Yes Historical Provider, MD  bisacodyl (DULCOLAX) 5 MG EC tablet Take 5 mg by mouth daily as needed for moderate constipation.   Yes Historical Provider, MD  bisoprolol (ZEBETA) 10 MG tablet Take 10 mg by mouth at bedtime.   Yes Historical Provider, MD  calcium-vitamin D (OSCAL WITH D) 500-200 MG-UNIT tablet Take 1 tablet by mouth daily.   Yes Historical Provider, MD  cyclobenzaprine (FLEXERIL) 10 MG tablet Take 10 mg by mouth 3 (three) times daily as needed for muscle spasms.   Yes Historical Provider, MD  diltiazem (CARTIA XT) 120 MG 24 hr capsule Take 120 mg by mouth daily. 10/13/15  Yes Historical Provider, MD  diphenhydrAMINE (BENADRYL) 25 MG tablet Take 25 mg by mouth every 6 (six) hours as needed for itching or allergies.   Yes Historical Provider, MD  Ergocalciferol (VITAMIN D2) 400 units TABS Take 400 Units by mouth daily. 03/18/08  Yes Historical Provider, MD  ferrous sulfate 325 (65 FE) MG tablet Take 325 mg by mouth 2 (two) times daily with a meal.   Yes Historical Provider, MD  guaiFENesin (MUCINEX) 600 MG 12 hr tablet Take 600 mg by mouth 2 (two) times daily as needed for cough or to loosen phlegm.   Yes Historical Provider, MD  magnesium citrate SOLN Take 1 Bottle by mouth daily as needed for mild constipation, moderate constipation or severe constipation.    Yes Historical Provider, MD  Multiple Vitamins-Minerals (MULTIVITAMIN WITH MINERALS) tablet Take 1 tablet by mouth every morning.  Yes Historical Provider, MD  omeprazole (PRILOSEC) 40 MG capsule Take 40 mg by mouth 2 (two) times daily.   Yes Historical Provider, MD  ondansetron (ZOFRAN) 4 MG tablet Take 4 mg by mouth every 8 (eight) hours as needed for nausea or vomiting.   Yes Historical Provider, MD  polyethylene glycol (MIRALAX / GLYCOLAX) packet Take 17 g by mouth daily as needed (constipation).   Yes Historical Provider, MD  saline (AYR) GEL Place 1  application into the nose every 4 (four) hours as needed (dryness/irritation).   Yes Historical Provider, MD  SYMBICORT 160-4.5 MCG/ACT inhaler Inhale 1 puff into the lungs 2 (two) times daily. 03/15/16  Yes Historical Provider, MD  OXYGEN Inhale 4 L into the lungs continuous.    Historical Provider, MD    Allergies  Allergen Reactions  . Clindamycin/Lincomycin Anaphylaxis and Swelling    Made face and tongue swell  . Clindamycin/Lincomycin Swelling    Facial and tongue swelling    FAMILY HISTORY:  family history includes Diabetes in his father.  SOCIAL HISTORY:  has an unknown smoking status. He has never used smokeless tobacco. He reports that he drinks alcohol. He reports that he does not use drugs.  REVIEW OF SYSTEMS:  POSITIVES IN BOLD Constitutional: Negative for fever, chills, weight loss, malaise/fatigue and diaphoresis.  HENT: Negative for hearing loss, ear pain, nosebleeds, congestion, sore throat, neck pain, tinnitus and ear discharge.   Eyes: Negative for blurred vision, double vision, photophobia, pain, discharge and redness.  Respiratory: Negative for cough, hemoptysis, sputum production, shortness of breath, wheezing and stridor.   Cardiovascular: Negative for chest pain, palpitations, orthopnea, claudication, leg swelling and PND.  Gastrointestinal: Negative for heartburn, nausea, vomiting, abdominal pain, diarrhea, constipation, blood in stool and melena.  Genitourinary: Negative for dysuria, urgency, frequency, hematuria and flank pain.  Musculoskeletal: Negative for myalgias, back pain, joint pain and falls.  Skin: Negative for itching and rash.  Neurological: Negative for dizziness, tingling, tremors, sensory change, speech change, focal weakness, seizures, loss of consciousness, weakness and headaches. Lightheadedness  Endo/Heme/Allergies: Negative for environmental allergies and polydipsia. Does not bruise/bleed easily.  SUBJECTIVE:   VITAL SIGNS: Temp:  [97.8  F (36.6 C)-98 F (36.7 C)] 98 F (36.7 C) (04/06 0122) Pulse Rate:  [96-111] 97 (04/06 0830) Resp:  [11-33] 17 (04/06 0830) BP: (127-158)/(82-127) 136/108 (04/06 0830) SpO2:  [92 %-100 %] 96 % (04/06 0830)  PHYSICAL EXAMINATION: General: chronically ill appearing male in NAD, lying on stretcher HEENT: MM pink/moist, good dentition  Neuro: AAOx4, speech clear, MAE CV: s1s2 rrr, no m/r/g PULM: even/non-labored, lungs bilaterally with good air movement, diminished, few faint wheezes ZO:XWRU, non-tender, bsx4 active  Extremities: warm/dry, no edema  Skin: no rashes or lesions, tattoos    Recent Labs Lab 04/16/16 0126  NA 137  K 4.1  CL 100*  CO2 31  BUN 7  CREATININE 0.92  GLUCOSE 113*     Recent Labs Lab 04/16/16 0126  HGB 12.9*  HCT 38.6*  WBC 7.1  PLT 272    Dg Chest 2 View  Result Date: 04/16/2016 CLINICAL DATA:  Worsening dyspnea over the past 3 days, much worse tonight. EXAM: CHEST  2 VIEW COMPARISON:  11/24/2015 FINDINGS: Severe chronic fibrotic changes consistent with the described history of sarcoidosis. No superimposed acute consolidation. No effusion. Unchanged hilar and mediastinal contours. IMPRESSION: Severe chronic fibrotic changes consistent with sarcoidosis. No superimposed acute consolidation or effusion. Electronically Signed   By: Rosey Bath.D.  On: 04/16/2016 01:35    SIGNIFICANT EVENTS  4/06  Admitted with SOB, ran out of nebs  STUDIES:  UDS 4/6 >>     ASSESSMENT / PLAN:  Discussion: 51 y/o M with stage IV sarcoidosis, bronchiectasis, COPD, OSA and chronic 5L O2 dependent COPD admitted with worsening SOB.  Pt attributes flare to running out of nebulized bronchodilators.  Rule out infectious etiology. No acute changes on CXR to suggest PNA.    Dyspnea - likely multifactorial in the setting of COPD, sarcoidosis, bronchiectasis.  R/O infectious etiology, rule out cocaine use COPD Stage IV Sarcoidosis  Bronchiectasis  Chronic  Hypoxic / O2 Dependent Respiratory Failure   Plan: Assess respiratory viral panel, influenza panel  Follow intermittent CXR Chest vest Q4 while awake Flutter valve  Nocturnal CPAP  Wean O2 for sats > 90% Assess UDS Solu-medrol 40 mg IV Q12  Duonebs Q4 + Pulmicort BID  Empiric levaquin  Sputum culture    PCCM will follow along with you.  Thank you for the consultation.   Canary Brim, NP-C [facility] Pgr: [phone] or if no answer [phone] 04/16/2016, 9:04 AM

## 2016-04-16 NOTE — Progress Notes (Signed)
Pharmacy Antibiotic Note  [name] is a 51 y.o. male c/o increased SOB x 4 days admitted on 04/16/2016 with Bronchitis.  Pharmacy has been consulted for Levaquin dosing.  Plan: Levaquin 750 mg IV q24h f/u scr/cultures as needed     Temp (24hrs), Avg:97.9 F (36.6 C), Min:97.8 F (36.6 C), Max:98 F (36.7 C)   Recent Labs Lab 04/16/16 0126  WBC 7.1  CREATININE 0.92    CrCl cannot be calculated (Unknown ideal weight.).    Allergies  Allergen Reactions  . Clindamycin/Lincomycin Anaphylaxis and Swelling    Made face and tongue swell  . Clindamycin/Lincomycin Swelling    Facial and tongue swelling    Antimicrobials this admission: 4/6 levaquin >>    >>   Dose adjustments this admission:   Microbiology results:  BCx:   UCx:    Sputum:    MRSA PCR:   Thank you for allowing pharmacy to be a part of this patient's care.  Lorenza Evangelist 04/16/2016 6:05 AM

## 2016-04-16 NOTE — H&P (Signed)
History and Physical    [name] RUE:[id number] DOB: 1965/02/19 DOA: 04/16/2016  PCP: Keturah Barre [facility]  Patient coming from: Home.  Chief Complaint: Shortness of breath.  HPI: [name] is a 51 y.o. male with known history of sarcoidosis stage IV, sleep apnea and hypertension presents with worsening shortness of breath over the last 1 week. Patient also has been having wheezing and productive cough. Denies any recent travel or sick contacts. Denies any fever or chills.   ED Course: X-ray in the ER shows features consistent with fibrosis. Exam patient is wheezing. Patient also complains of right-sided pleuritic-type of chest pain. Patient is started on nebulizer treatment and steroids and antibiotic admitted for respiratory failure most likely from sarcoidosis exacerbation.  Review of Systems: As per HPI, rest all negative.   Past Medical History:  Diagnosis Date  . Bronchitis   . CHF (congestive heart failure) (HCC)   . Chronic respiratory failure (HCC)    home O@ 4L (01/28/14)  . Chronic respiratory failure (HCC)   . COPD (chronic obstructive pulmonary disease) (HCC)   . Hypertension   . Neuromuscular disorder (HCC)   . OSA on CPAP   . Pneumonia   . Sarcoidosis [facility])     Past Surgical History:  Procedure Laterality Date  . arm surgery     . ROTATOR CUFF REPAIR    . VIDEO ASSISTED THORACOSCOPY (VATS)/THOROCOTOMY Left 07/17/2012   Procedure: VIDEO ASSISTED THORACOSCOPY (VATS)/BLEB Stapling;  Surgeon: Alleen Borne, MD;  Location: [facility] OR;  Service: Thoracic;  Laterality: Left;     has an unknown smoking status. He has never used smokeless tobacco. He reports that he drinks alcohol. He reports that he does not use drugs.  Allergies  Allergen Reactions  . Clindamycin/Lincomycin Anaphylaxis and Swelling    Made face and tongue swell  . Clindamycin/Lincomycin Swelling    Facial and tongue swelling    Family History  Problem Relation Age of Onset  . Diabetes  Father     Prior to Admission medications   Medication Sig Start Date End Date Taking? Authorizing Provider  albuterol (PROVENTIL HFA;VENTOLIN HFA) 108 (90 Base) MCG/ACT inhaler Inhale 2 puffs into the lungs every 6 (six) hours as needed for wheezing or shortness of breath.    Historical Provider, MD  albuterol (PROVENTIL) (2.5 MG/3ML) 0.083% nebulizer solution Take 2.5 mg by nebulization every 4 (four) hours as needed for wheezing or shortness of breath (wheezing and shortness of breath).  06/08/12   Historical Provider, MD  albuterol (PROVENTIL) (2.5 MG/3ML) 0.083% nebulizer solution Take 2.5 mg by nebulization every 6 (six) hours as needed for wheezing or shortness of breath.    Historical Provider, MD  bisacodyl (BISACODYL) 5 MG EC tablet Take 1 tablet (5 mg total) by mouth daily as needed for moderate constipation. 01/16/14   Simonne Martinet, NP  bisacodyl (DULCOLAX) 5 MG EC tablet Take 5 mg by mouth daily as needed for moderate constipation.    Historical Provider, MD  bisoprolol (ZEBETA) 10 MG tablet Take 1 tablet (10 mg total) by mouth daily. Patient taking differently: Take 10 mg by mouth at bedtime.  03/26/14   Maryann Mikhail, DO  bisoprolol (ZEBETA) 10 MG tablet Take 10 mg by mouth at bedtime.    Historical Provider, MD  budesonide-formoterol (SYMBICORT) 160-4.5 MCG/ACT inhaler Inhale 2 puffs into the lungs 2 (two) times daily. 01/22/16 01/23/16  Leslye Peer, MD  calcium-vitamin D (OSCAL WITH D) 500-200 MG-UNIT per tablet Take  1 tablet by mouth every morning.    Historical Provider, MD  calcium-vitamin D (OSCAL WITH D) 500-200 MG-UNIT tablet Take 1 tablet by mouth daily.    Historical Provider, MD  Cholecalciferol (VITAMIN D PO) Take 1 tablet by mouth daily.    Historical Provider, MD  cyclobenzaprine (FLEXERIL) 10 MG tablet Take 10 mg by mouth 3 (three) times daily as needed for muscle spasms (muscle spasms).     Historical Provider, MD  cyclobenzaprine (FLEXERIL) 10 MG tablet Take 10 mg  by mouth 3 (three) times daily as needed for muscle spasms.    Historical Provider, MD  diltiazem (CARDIZEM CD) 120 MG 24 hr capsule Take 1 capsule (120 mg total) by mouth daily. 01/28/14   Jeanella Craze, NP  diltiazem (CARTIA XT) 120 MG 24 hr capsule Take 120 mg by mouth daily. 10/13/15   Historical Provider, MD  diltiazem (CARTIA XT) 120 MG 24 hr capsule Take 120 mg by mouth at bedtime.    Historical Provider, MD  diphenhydrAMINE (BENADRYL) 25 MG tablet Take 25 mg by mouth every 6 (six) hours as needed for allergies (allergies).     Historical Provider, MD  diphenhydrAMINE (BENADRYL) 25 MG tablet Take 25 mg by mouth every 6 (six) hours as needed for itching or allergies.    Historical Provider, MD  doxycycline (VIBRA-TABS) 100 MG tablet Take 1 tablet (100 mg total) by mouth 2 (two) times daily. 12/11/15   Bevelyn Ngo, NP  Ergocalciferol (VITAMIN D2) 400 units TABS Take 400 Units by mouth daily. 03/18/08   Historical Provider, MD  ferrous sulfate 325 (65 FE) MG tablet Take 1 tablet (325 mg total) by mouth 2 (two) times daily with a meal. 06/18/12   Leroy Sea, MD  ferrous sulfate 325 (65 FE) MG tablet Take 325 mg by mouth 2 (two) times daily with a meal.    Historical Provider, MD  guaiFENesin (MUCINEX) 600 MG 12 hr tablet Take 1 tablet (600 mg total) by mouth 2 (two) times daily. Patient taking differently: Take 600 mg by mouth 2 (two) times daily as needed for cough or to loosen phlegm.  07/26/15   Costin Otelia Sergeant, MD  guaiFENesin (MUCINEX) 600 MG 12 hr tablet Take 600 mg by mouth 2 (two) times daily as needed for cough or to loosen phlegm.    Historical Provider, MD  guaiFENesin-codeine 100-10 MG/5ML syrup Take 5 mLs by mouth 4 (four) times daily. 01/17/16   Bethany Molt, DO  magnesium citrate SOLN Take 1 Bottle by mouth daily as needed for mild constipation, moderate constipation or severe constipation.     Historical Provider, MD  Multiple Vitamin (MULTIVITAMIN WITH MINERALS) TABS tablet Take 1  tablet by mouth daily.    Historical Provider, MD  Multiple Vitamins-Minerals (MULTIVITAMIN WITH MINERALS) tablet Take 1 tablet by mouth every morning.     Historical Provider, MD  omeprazole (PRILOSEC) 40 MG capsule Take 40 mg by mouth daily. 11/04/14   Historical Provider, MD  omeprazole (PRILOSEC) 40 MG capsule Take 40 mg by mouth 2 (two) times daily.    Historical Provider, MD  ondansetron (ZOFRAN) 4 MG tablet Take 4 mg by mouth every 8 (eight) hours as needed for nausea or vomiting.  09/05/14   Historical Provider, MD  ondansetron (ZOFRAN) 4 MG tablet Take 4 mg by mouth every 8 (eight) hours as needed for nausea or vomiting.    Historical Provider, MD  OXYGEN Inhale 4 L into the lungs continuous.  Historical Provider, MD  polyethylene glycol (MIRALAX / GLYCOLAX) packet Take 17 g by mouth daily. Patient taking differently: Take 17 g by mouth daily as needed for mild constipation or moderate constipation.  01/16/14   Simonne Martinet, NP  polyethylene glycol [facility] / GLYCOLAX) packet Take 17 g by mouth daily as needed (constipation).    Historical Provider, MD  predniSONE (DELTASONE) 10 MG tablet Take 4 tabs x 2 days,3 tabs x 2 days,2 tabs x 2 days,then 1 tab x 2 days and stop. 12/11/15   Bevelyn Ngo, NP  predniSONE (STERAPRED UNI-PAK 21 TAB) 10 MG (21) TBPK tablet Take 1 tablet (10 mg total) by mouth as directed. Take 4 tabs daily with food for 4 days, then 3 tabs daily for 4 days, then 2 tabs daily for 4 days, then 1 tab daily for 4 days then stop. 01/17/16   Bethany Molt, DO  PRESCRIPTION MEDICATION Inhale into the lungs at bedtime.    Historical Provider, MD  saline (AYR) GEL Place 1 application into the nose every 4 (four) hours as needed. Patient taking differently: Place 1 application into the nose every 4 (four) hours as needed (dryness/ irritation).  09/20/15   Calvert Cantor, MD  saline (AYR) GEL Place 1 application into the nose every 4 (four) hours as needed (dryness/irritation).     Historical Provider, MD  VENTOLIN HFA 108 (90 Base) MCG/ACT inhaler INHALE TWO PUFFS BY MOUTH EVERY 6 HOURS AS NEEDED FOR WHEEZING OR SHORTNESS OF BREATH 01/02/16   Leslye Peer, MD    Physical Exam: Vitals:   04/16/16 0500 04/16/16 0515 04/16/16 0530 04/16/16 0536  BP: (!) 137/111 (!) 147/118 (!) 139/96   Pulse: (!) 101 (!) 104 (!) 103   Resp: (!) 26 (!) 21 (!) 33   Temp:      TempSrc:      SpO2: 97% 100% 100% 99%      Constitutional: Moderately built and nourished. Vitals:   04/16/16 0500 04/16/16 0515 04/16/16 0530 04/16/16 0536  BP: (!) 137/111 (!) 147/118 (!) 139/96   Pulse: (!) 101 (!) 104 (!) 103   Resp: (!) 26 (!) 21 (!) 33   Temp:      TempSrc:      SpO2: 97% 100% 100% 99%   Eyes: Anicteric no pallor. ENMT: No discharge from the ears eyes nose and mouth were Neck: No mass felt. No JVD appreciated. Respiratory: Bilateral expiratory wheeze and no crepitations. Cardiovascular: S1-S2 heard no murmurs appreciated. Abdomen: Soft nontender bowel sounds present. Musculoskeletal: No edema. No joint effusion. Skin: No rash. Skin appears warm. Neurologic: Alert awake oriented to time place and person. Moves all extremities. Psychiatric: Appears normal. Normal affect.   Labs on Admission: I have personally reviewed following labs and imaging studies  CBC:  Recent Labs Lab 04/16/16 0126  WBC 7.1  HGB 12.9*  HCT 38.6*  MCV 82.8  PLT 272   Basic Metabolic Panel:  Recent Labs Lab 04/16/16 0126  NA 137  K 4.1  CL 100*  CO2 31  GLUCOSE 113*  BUN 7  CREATININE 0.92  CALCIUM 9.5   GFR: CrCl cannot be calculated (Unknown ideal weight.). Liver Function Tests: No results for input(s): AST, ALT, ALKPHOS, BILITOT, PROT, ALBUMIN in the last 168 hours. No results for input(s): LIPASE, AMYLASE in the last 168 hours. No results for input(s): AMMONIA in the last 168 hours. Coagulation Profile: No results for input(s): INR, PROTIME in the last 168 hours. Cardiac  Enzymes: No results for input(s): CKTOTAL, CKMB, CKMBINDEX, TROPONINI in the last 168 hours. BNP (last 3 results) No results for input(s): PROBNP in the last 8760 hours. HbA1C: No results for input(s): HGBA1C in the last 72 hours. CBG: No results for input(s): GLUCAP in the last 168 hours. Lipid Profile: No results for input(s): CHOL, HDL, LDLCALC, TRIG, CHOLHDL, LDLDIRECT in the last 72 hours. Thyroid Function Tests: No results for input(s): TSH, T4TOTAL, FREET4, T3FREE, THYROIDAB in the last 72 hours. Anemia Panel: No results for input(s): VITAMINB12, FOLATE, FERRITIN, TIBC, IRON, RETICCTPCT in the last 72 hours. Urine analysis:    Component Value Date/Time   COLORURINE YELLOW 01/09/2014 1843   APPEARANCEUR CLEAR 01/09/2014 1843   LABSPEC 1.023 01/09/2014 1843   PHURINE 5.5 01/09/2014 1843   GLUCOSEU 250 (A) 01/09/2014 1843   HGBUR NEGATIVE 01/09/2014 1843   BILIRUBINUR NEGATIVE 01/09/2014 1843   KETONESUR NEGATIVE 01/09/2014 1843   PROTEINUR NEGATIVE 01/09/2014 1843   UROBILINOGEN 0.2 01/09/2014 1843   NITRITE NEGATIVE 01/09/2014 1843   LEUKOCYTESUR NEGATIVE 01/09/2014 1843   Sepsis Labs: (procalcitonin:4,lacticidven:4) )No results found for this or any previous visit (from the past 240 hour(s)).   Radiological Exams on Admission: Dg Chest 2 View  Result Date: 04/16/2016 CLINICAL DATA:  Worsening dyspnea over the past 3 days, much worse tonight. EXAM: CHEST  2 VIEW COMPARISON:  11/24/2015 FINDINGS: Severe chronic fibrotic changes consistent with the described history of sarcoidosis. No superimposed acute consolidation. No effusion. Unchanged hilar and mediastinal contours. IMPRESSION: Severe chronic fibrotic changes consistent with sarcoidosis. No superimposed acute consolidation or effusion. Electronically Signed   By: Ellery Plunk M.D.   On: 04/16/2016 01:35    Assessment/Plan Active Problems:   OSA on CPAP   Acute on chronic respiratory failure with  hypoxia (HCC)   Essential hypertension   Sarcoidosis (HCC)   Acute respiratory failure with hypoxia (HCC)    1. Acute on chronic respiratory failure probably from sarcoidosis exacerbation - patient will be continued on IV Solu-Medrol Levaquin Pulmicort nebulizer. I have consulted pulmonary critical care. 2. Hypertension on beta blockers and Cardizem which will be continued. 3. Sleep apnea on CPAP.   DVT prophylaxis: Lovenox. Code Status: Full code.  Family Communication: Discussed with patient.  Disposition Plan: Home.  Consults called: Pulmonary critical care.  Admission status: Inpatient.    Eduard Clos MD [facility] Pager [phone].  If 7PM-7AM, please contact night-coverage [email or web address] Password [facility]  04/16/2016, 5:48 AM

## 2016-04-16 NOTE — Progress Notes (Signed)
Pt does not want to do Chest PT at this time. He says he just ate and will do it in a few hours.

## 2016-04-16 NOTE — Progress Notes (Signed)
Patient declines blood products

## 2016-04-16 NOTE — ED Triage Notes (Signed)
Pt states that he has had increased WOB and SOB x 4 days. Hx of CHF, COPD and sarcoidosis. 4L [place] at home. Use of accessory muscles in triage. Alert and oriented.

## 2016-04-17 ENCOUNTER — Inpatient Hospital Stay (HOSPITAL_COMMUNITY): Payer: Commercial Managed Care - HMO

## 2016-04-17 LAB — BASIC METABOLIC PANEL
Anion gap: 9 (ref 5–15)
BUN: 16 mg/dL (ref 6–20)
CO2: 27 mmol/L (ref 22–32)
Calcium: 9.8 mg/dL (ref 8.9–10.3)
Chloride: 102 mmol/L (ref 101–111)
Creatinine, Ser: 0.98 mg/dL (ref 0.61–1.24)
GFR calc non Af Amer: >60 mL/min (ref >60)
Glucose, Bld: 112 mg/dL — ABNORMAL HIGH (ref 65–99)
Potassium: 4.9 mmol/L (ref 3.5–5.1)
Sodium: 138 mmol/L (ref 135–145)

## 2016-04-17 LAB — CBC
HCT: 36.3 % — ABNORMAL LOW (ref 39.0–52.0)
Hemoglobin: 11.9 g/dL — ABNORMAL LOW (ref 13.0–17.0)
MCH: 27.3 pg (ref 26.0–34.0)
MCHC: 32.8 g/dL (ref 30.0–36.0)
MCV: 83.3 fL (ref 78.0–100.0)
PLATELETS: 301 10*3/uL (ref 150–400)
RBC: 4.36 MIL/uL (ref 4.22–5.81)
RDW: 12.5 % (ref 11.5–15.5)
WBC: 6.7 10*3/uL (ref 4.0–10.5)

## 2016-04-17 LAB — HIV ANTIBODY (ROUTINE TESTING W REFLEX): HIV Screen 4th Generation wRfx: NONREACTIVE

## 2016-04-17 MED ORDER — IPRATROPIUM-ALBUTEROL 0.5-2.5 (3) MG/3ML IN SOLN
3.0000 mL | RESPIRATORY_TRACT | Status: DC
  Administered 2016-04-17 – 2016-04-18 (×10): 3 mL via RESPIRATORY_TRACT
  Filled 2016-04-17 (×10): qty 3

## 2016-04-17 NOTE — Progress Notes (Signed)
Pt does not want to do Chest PT at this time. He says he just ate and is full. He will do it with next treatment.

## 2016-04-17 NOTE — Progress Notes (Signed)
PROGRESS NOTE  [name]  EAV:[id number] DOB: 02/18/1965  DOA: 04/16/2016 PCP: L[facility] Liner [facility]   Brief Narrative:  51 year old with sarcoidosis, bullous disease (never smoked cigarettes), sleep apnea with chronic respiratory failure on 5 L oxygen at home, presenting with 1 week history of shortness of breath with wheezing and productive cough without fever or chills. CXR negative for airspace disease.  Admitted for acute on chronic respiratory failure with possible bronchitis in the setting of sarcoidosis. He's been treated for COPD with IV antibiotics/steroids with bronchodilators  Assessment & Plan:   Principal Problem:   Acute on chronic respiratory failure with hypoxia (HCC) Active Problems:   OSA on CPAP   Essential hypertension   Sarcoidosis (HCC)   Acute respiratory failure with hypoxia (HCC)  Acute on chronic Hypoxic respiratory failure: Due to sarcoidosis/bulous disese IV antibiotics/steroids with bronchodilators/pulm toileting  Sleep apnea: Cont CPAP  Drug Abuse: uds pos(+) for cocaine/benzos Drug abuse counselling Avoid narcotics    DVT prophylaxis: lovenox Code Status: Full Family Communication:  Disposition Plan: Home   Consultants:  PCCM  Procedures:   Antimicrobials:  Levaquin  Subjective: Feeling a lot better today. No f/c, sob better, no chest pains; tx to tele today  Objective:  Vitals:   04/17/16 1200 04/17/16 1221 04/17/16 1400 04/17/16 1511  BP: 119/85  115/73 108/71  Pulse: 92  91 89  Resp: 20  (!) 23 20  Temp: 98.4 F (36.9 C)   98.2 F (36.8 C)  TempSrc: Oral   Oral  SpO2: 97% 95% 99% 100%  Weight:   61.2 kg (134 lb 14.7 oz)   Height:    (1.702 m)     Intake/Output Summary (Last 24 hours) at 04/17/16 1513 Last data filed at 04/17/16 1400  Gross per 24 hour  Intake              750 ml  Output              950 ml  Net             -200 ml   Filed Weights   04/17/16 1400  Weight: 61.2 kg (134 lb 14.7 oz)      Examination:  General exam: No acute distress on O2 by [place] Respiratory system: Bilateral wheezing. Respiratory effort normal. Cardiovascular system: S1 & S2 heard, RRR. No JVD, murmurs, rubs, gallops or clicks. No pedal edema. Gastrointestinal system: Abdomen is nondistended, soft and nontender. No organomegaly or masses felt. Normal bowel sounds heard. Central nervous system: Alert and oriented. No focal neurological deficits. Extremities: Symmetric 5 x 5 power. Skin: No rashes, lesions or ulcers Psychiatry: Judgement and insight appear normal. Mood & affect appropriate.     Data Reviewed: I have personally reviewed following labs and imaging studies  CBC:  Recent Labs Lab 04/16/16 0126 04/17/16 0401  WBC 7.1 6.7  HGB 12.9* 11.9*  HCT 38.6* 36.3*  MCV 82.8 83.3  PLT 272 301   Basic Metabolic Panel:  Recent Labs Lab 04/16/16 0126 04/17/16 0401  NA 137 138  K 4.1 4.9  CL 100* 102  CO2 31 27  GLUCOSE 113* 112*  BUN 7 16  CREATININE 0.92 0.98  CALCIUM 9.5 9.8   GFR: Estimated Creatinine Clearance: 77.2 mL/min (by C-G formula based on SCr of 0.98 mg/dL). Liver Function Tests: No results for input(s): AST, ALT, ALKPHOS, BILITOT, PROT, ALBUMIN in the last 168 hours. No results for input(s): LIPASE, AMYLASE in the last 168 hours.  No results for input(s): AMMONIA in the last 168 hours. Coagulation Profile: No results for input(s): INR, PROTIME in the last 168 hours. Cardiac Enzymes:  Recent Labs Lab 04/16/16 1025  TROPONINI <0.03   BNP (last 3 results) No results for input(s): PROBNP in the last 8760 hours. HbA1C: No results for input(s): HGBA1C in the last 72 hours. CBG: No results for input(s): GLUCAP in the last 168 hours. Lipid Profile: No results for input(s): CHOL, HDL, LDLCALC, TRIG, CHOLHDL, LDLDIRECT in the last 72 hours. Thyroid Function Tests: No results for input(s): TSH, T4TOTAL, FREET4, T3FREE, THYROIDAB in the last 72 hours. Anemia  Panel: No results for input(s): VITAMINB12, FOLATE, FERRITIN, TIBC, IRON, RETICCTPCT in the last 72 hours.  Sepsis Labs:  Recent Labs Lab 04/16/16 0126 04/17/16 0401  WBC 7.1 6.7    Recent Results (from the past 240 hour(s))  MRSA PCR Screening     Status: None   Collection Time: 04/16/16 10:00 AM  Result Value Ref Range Status   MRSA by PCR NEGATIVE NEGATIVE Final    Comment:        The GeneXpert MRSA Assay (FDA approved for NASAL specimens only), is one component of a comprehensive MRSA colonization surveillance program. It is not intended to diagnose MRSA infection nor to guide or monitor treatment for MRSA infections.   Respiratory Panel by PCR     Status: None   Collection Time: 04/16/16 12:18 PM  Result Value Ref Range Status   Adenovirus NOT DETECTED NOT DETECTED Final   Coronavirus 229E NOT DETECTED NOT DETECTED Final   Coronavirus HKU1 NOT DETECTED NOT DETECTED Final   Coronavirus NL63 NOT DETECTED NOT DETECTED Final   Coronavirus OC43 NOT DETECTED NOT DETECTED Final   Metapneumovirus NOT DETECTED NOT DETECTED Final   Rhinovirus / Enterovirus NOT DETECTED NOT DETECTED Final   Influenza A NOT DETECTED NOT DETECTED Final   Influenza B NOT DETECTED NOT DETECTED Final   Parainfluenza Virus 1 NOT DETECTED NOT DETECTED Final   Parainfluenza Virus 2 NOT DETECTED NOT DETECTED Final   Parainfluenza Virus 3 NOT DETECTED NOT DETECTED Final   Parainfluenza Virus 4 NOT DETECTED NOT DETECTED Final   Respiratory Syncytial Virus NOT DETECTED NOT DETECTED Final   Bordetella pertussis NOT DETECTED NOT DETECTED Final   Chlamydophila pneumoniae NOT DETECTED NOT DETECTED Final   Mycoplasma pneumoniae NOT DETECTED NOT DETECTED Final    Comment: Performed at [facility], [address] [place], [place] [address]         Radiology Studies: Dg Chest 2 View  Result Date: 04/17/2016 CLINICAL DATA:  51 year old male with a history of shortness of breath. Known  sarcoidosis. EXAM: CHEST  2 VIEW COMPARISON:  Multiple prior comparison, with CT 09/30/2014. Most recent plain film 04/16/2016 FINDINGS: Cardiomediastinal silhouette unchanged. Heart borders partially obscured by overlying lung/ pleural disease. Compared to the prior chest x-ray studies, there is more pronounced reticulonodular opacity in the left mid lung. Blunting of the bilateral costophrenic sulci, similar to prior. Diffuse architectural distortion, distributed evenly from superior to inferior, with no predominance. Chronic changes better characterized on prior CT study in this patient with known sarcoid. No displaced fracture. IMPRESSION: Advanced chronic lung disease bilaterally in this patient with known sarcoidosis, with increasing reticulonodular opacity of the left mid lung. Superimposed acute infection cannot be excluded. Electronically Signed   By: Gilmer Mor D.O.   On: 04/17/2016 09:38   Dg Chest 2 View  Result Date: 04/16/2016 CLINICAL DATA:  Worsening dyspnea over the past 3 days, much worse tonight. EXAM: CHEST  2 VIEW COMPARISON:  11/24/2015 FINDINGS: Severe chronic fibrotic changes consistent with the described history of sarcoidosis. No superimposed acute consolidation. No effusion. Unchanged hilar and mediastinal contours. IMPRESSION: Severe chronic fibrotic changes consistent with sarcoidosis. No superimposed acute consolidation or effusion. Electronically Signed   By: Ellery Plunk M.D.   On: 04/16/2016 01:35        Scheduled Meds: . bisoprolol  10 mg Oral QHS  . budesonide (PULMICORT) nebulizer solution  0.5 mg Nebulization BID  . calcium-vitamin D  1 tablet Oral Daily  . diltiazem  120 mg Oral QHS  . enoxaparin (LOVENOX) injection  40 mg Subcutaneous Q24H  . ferrous sulfate  325 mg Oral BID WC  . ipratropium-albuterol  3 mL Nebulization Q4H  . levofloxacin (LEVAQUIN) IV  750 mg Intravenous Daily  . methylPREDNISolone (SOLU-MEDROL) injection  40 mg Intravenous Q12H   . multivitamin with minerals  1 tablet Oral Daily  . pantoprazole  40 mg Oral Daily   Continuous Infusions:   LOS: 1 day    Time spent: 35 mins    OSEI-BONSU,[name] Brandon, MD [facility] Pager [phone]  If 7PM-7AM, please contact night-coverage [email or web address] Password [email or web address] 04/17/2016, 3:13 PM

## 2016-04-18 LAB — EXPECTORATED SPUTUM ASSESSMENT W GRAM STAIN, RFLX TO RESP C

## 2016-04-18 LAB — EXPECTORATED SPUTUM ASSESSMENT W REFEX TO RESP CULTURE

## 2016-04-18 MED ORDER — DILTIAZEM HCL ER COATED BEADS 180 MG PO CP24
180.0000 mg | ORAL_CAPSULE | Freq: Every day | ORAL | Status: DC
  Administered 2016-04-18: 180 mg via ORAL
  Filled 2016-04-18: qty 1

## 2016-04-18 MED ORDER — IPRATROPIUM-ALBUTEROL 0.5-2.5 (3) MG/3ML IN SOLN
3.0000 mL | Freq: Four times a day (QID) | RESPIRATORY_TRACT | Status: DC
  Administered 2016-04-19 (×3): 3 mL via RESPIRATORY_TRACT
  Filled 2016-04-18 (×3): qty 3

## 2016-04-18 NOTE — Progress Notes (Signed)
PROGRESS NOTE  [name]  ZOX:[id number] DOB: Sep 02, 1965  DOA: 04/16/2016 PCP: [facility]   Brief Narrative:  51 year old with sarcoidosis, bullous disease (never smoked cigarettes), sleep apnea with chronic respiratory failure on 5 L oxygen at home, presenting with 1 week history of shortness of breath with wheezing and productive cough without fever or chills. CXR negative for airspace disease.  Admitted for acute on chronic respiratory failure with possible bronchitis in the setting of sarcoidosis. He's been treated for COPD with IV antibiotics/steroids with bronchodilators  Assessment & Plan:   Principal Problem:   Acute on chronic respiratory failure with hypoxia (HCC) Active Problems:   OSA on CPAP   Essential hypertension   Sarcoidosis (HCC)   Acute respiratory failure with hypoxia (HCC)  Acute on chronic Hypoxic respiratory failure: Due to sarcoidosis/bulous disese IV antibiotics/steroids with bronchodilators/pulm toileting  Sleep apnea: Cont CPAP  Drug Abuse: uds pos(+) for cocaine/benzos Drug abuse counselling Avoid narcotics/ nonselective beta blockers    DVT prophylaxis: lovenox Code Status: Full Family Communication:  Disposition Plan: Home   Consultants:  PCCM  Procedures:   Antimicrobials:  Levaquin  Subjective: Slowly improving. No f/c, sob better, no chest pains Objective:  Vitals:   04/17/16 2112 04/18/16 0502 04/18/16 1403 04/18/16 1416  BP: 116/78 104/81 128/87   Pulse: 79 88 85   Resp: 18 18 (!) 22   Temp: 98.5 F (36.9 C) 97.8 F (36.6 C)  98.5 F (36.9 C)  TempSrc: Oral Oral  Oral  SpO2: 99% 100% 100%   Weight:      Height:        Intake/Output Summary (Last 24 hours) at 04/18/16 1442 Last data filed at 04/18/16 0500  Gross per 24 hour  Intake             1440 ml  Output             1400 ml  Net               40 ml   Filed Weights   04/17/16 1400  Weight: 61.2 kg (134 lb 14.7 oz)     Examination:  General exam: No acute distress on O2 by [place] Respiratory system: Bilateral wheezing. Respiratory effort normal. Cardiovascular system: S1 & S2 heard, RRR. No JVD, murmurs, rubs, gallops or clicks. No pedal edema. Gastrointestinal system: Abdomen is nondistended, soft and nontender. No organomegaly or masses felt. Normal bowel sounds heard. Central nervous system: Alert and oriented. No focal neurological deficits. Extremities: Symmetric 5 x 5 power. Skin: No rashes, lesions or ulcers Psychiatry: Judgement and insight appear normal. Mood & affect appropriate.     Data Reviewed: I have personally reviewed following labs and imaging studies  CBC:  Recent Labs Lab 04/16/16 0126 04/17/16 0401  WBC 7.1 6.7  HGB 12.9* 11.9*  HCT 38.6* 36.3*  MCV 82.8 83.3  PLT 272 301   Basic Metabolic Panel:  Recent Labs Lab 04/16/16 0126 04/17/16 0401  NA 137 138  K 4.1 4.9  CL 100* 102  CO2 31 27  GLUCOSE 113* 112*  BUN 7 16  CREATININE 0.92 0.98  CALCIUM 9.5 9.8   GFR: Estimated Creatinine Clearance: 77.2 mL/min (by C-G formula based on SCr of 0.98 mg/dL). Liver Function Tests: No results for input(s): AST, ALT, ALKPHOS, BILITOT, PROT, ALBUMIN in the last 168 hours. No results for input(s): LIPASE, AMYLASE in the last 168 hours. No results for input(s): AMMONIA in the last 168 hours. Coagulation  Profile: No results for input(s): INR, PROTIME in the last 168 hours. Cardiac Enzymes:  Recent Labs Lab 04/16/16 1025  TROPONINI <0.03   BNP (last 3 results) No results for input(s): PROBNP in the last 8760 hours. HbA1C: No results for input(s): HGBA1C in the last 72 hours. CBG: No results for input(s): GLUCAP in the last 168 hours. Lipid Profile: No results for input(s): CHOL, HDL, LDLCALC, TRIG, CHOLHDL, LDLDIRECT in the last 72 hours. Thyroid Function Tests: No results for input(s): TSH, T4TOTAL, FREET4, T3FREE, THYROIDAB in the last 72 hours. Anemia  Panel: No results for input(s): VITAMINB12, FOLATE, FERRITIN, TIBC, IRON, RETICCTPCT in the last 72 hours.  Sepsis Labs:  Recent Labs Lab 04/16/16 0126 04/17/16 0401  WBC 7.1 6.7    Recent Results (from the past 240 hour(s))  MRSA PCR Screening     Status: None   Collection Time: 04/16/16 10:00 AM  Result Value Ref Range Status   MRSA by PCR NEGATIVE NEGATIVE Final    Comment:        The GeneXpert MRSA Assay (FDA approved for NASAL specimens only), is one component of a comprehensive MRSA colonization surveillance program. It is not intended to diagnose MRSA infection nor to guide or monitor treatment for MRSA infections.   Respiratory Panel by PCR     Status: None   Collection Time: 04/16/16 12:18 PM  Result Value Ref Range Status   Adenovirus NOT DETECTED NOT DETECTED Final   Coronavirus 229E NOT DETECTED NOT DETECTED Final   Coronavirus HKU1 NOT DETECTED NOT DETECTED Final   Coronavirus NL63 NOT DETECTED NOT DETECTED Final   Coronavirus OC43 NOT DETECTED NOT DETECTED Final   Metapneumovirus NOT DETECTED NOT DETECTED Final   Rhinovirus / Enterovirus NOT DETECTED NOT DETECTED Final   Influenza A NOT DETECTED NOT DETECTED Final   Influenza B NOT DETECTED NOT DETECTED Final   Parainfluenza Virus 1 NOT DETECTED NOT DETECTED Final   Parainfluenza Virus 2 NOT DETECTED NOT DETECTED Final   Parainfluenza Virus 3 NOT DETECTED NOT DETECTED Final   Parainfluenza Virus 4 NOT DETECTED NOT DETECTED Final   Respiratory Syncytial Virus NOT DETECTED NOT DETECTED Final   Bordetella pertussis NOT DETECTED NOT DETECTED Final   Chlamydophila pneumoniae NOT DETECTED NOT DETECTED Final   Mycoplasma pneumoniae NOT DETECTED NOT DETECTED Final    Comment: Performed at [facility], [address] [place], [place] [address]         Radiology Studies: Dg Chest 2 View  Result Date: 04/17/2016 CLINICAL DATA:  51 year old male with a history of shortness of breath. Known  sarcoidosis. EXAM: CHEST  2 VIEW COMPARISON:  Multiple prior comparison, with CT 09/30/2014. Most recent plain film 04/16/2016 FINDINGS: Cardiomediastinal silhouette unchanged. Heart borders partially obscured by overlying lung/ pleural disease. Compared to the prior chest x-ray studies, there is more pronounced reticulonodular opacity in the left mid lung. Blunting of the bilateral costophrenic sulci, similar to prior. Diffuse architectural distortion, distributed evenly from superior to inferior, with no predominance. Chronic changes better characterized on prior CT study in this patient with known sarcoid. No displaced fracture. IMPRESSION: Advanced chronic lung disease bilaterally in this patient with known sarcoidosis, with increasing reticulonodular opacity of the left mid lung. Superimposed acute infection cannot be excluded. Electronically Signed   By: Gilmer Mor D.O.   On: 04/17/2016 09:38        Scheduled Meds: . bisoprolol  10 mg Oral QHS  . budesonide (PULMICORT) nebulizer solution  0.5 mg Nebulization BID  . calcium-vitamin D  1 tablet Oral Daily  . diltiazem  120 mg Oral QHS  . enoxaparin (LOVENOX) injection  40 mg Subcutaneous Q24H  . ferrous sulfate  325 mg Oral BID WC  . ipratropium-albuterol  3 mL Nebulization Q4H  . levofloxacin (LEVAQUIN) IV  750 mg Intravenous Daily  . methylPREDNISolone (SOLU-MEDROL) injection  40 mg Intravenous Q12H  . multivitamin with minerals  1 tablet Oral Daily  . pantoprazole  40 mg Oral Daily   Continuous Infusions:   LOS: 2 days    Time spent: 25 mins    OSEI-BONSU,Dametrius Sanjuan, MD [facility] Pager [phone]  If 7PM-7AM, please contact night-coverage [email or web address] Password [email or web address] 04/18/2016, 2:42 PM

## 2016-04-18 NOTE — Progress Notes (Signed)
Patient does not want to do 0400 CPT at this time.

## 2016-04-18 NOTE — Progress Notes (Signed)
Patient wishes to wait on CPT at this time because he is still full from dinner. RT will continue to monitor.

## 2016-04-18 NOTE — Progress Notes (Signed)
Patient states he can place himself on CPAP when ready. Machine is plugged in and chamber is full with water. RT will continue to monitor.

## 2016-04-19 DIAGNOSIS — G4733 Obstructive sleep apnea (adult) (pediatric): Secondary | ICD-10-CM

## 2016-04-19 DIAGNOSIS — Z9989 Dependence on other enabling machines and devices: Secondary | ICD-10-CM

## 2016-04-19 LAB — CBC
HCT: 37.6 % — ABNORMAL LOW (ref 39.0–52.0)
HEMOGLOBIN: 12.3 g/dL — AB (ref 13.0–17.0)
MCH: 26.8 pg (ref 26.0–34.0)
MCHC: 32.7 g/dL (ref 30.0–36.0)
MCV: 81.9 fL (ref 78.0–100.0)
Platelets: 339 10*3/uL (ref 150–400)
RBC: 4.59 MIL/uL (ref 4.22–5.81)
RDW: 12.4 % (ref 11.5–15.5)
WBC: 13.5 10*3/uL — AB (ref 4.0–10.5)

## 2016-04-19 LAB — BASIC METABOLIC PANEL
ANION GAP: 9 (ref 5–15)
BUN: 18 mg/dL (ref 6–20)
CO2: 28 mmol/L (ref 22–32)
Calcium: 9.2 mg/dL (ref 8.9–10.3)
Chloride: 101 mmol/L (ref 101–111)
Creatinine, Ser: 0.92 mg/dL (ref 0.61–1.24)
Glucose, Bld: 159 mg/dL — ABNORMAL HIGH (ref 65–99)
Potassium: 4.3 mmol/L (ref 3.5–5.1)
Sodium: 138 mmol/L (ref 135–145)

## 2016-04-19 MED ORDER — LEVOFLOXACIN 500 MG PO TABS: 500.0000 mg | ORAL_TABLET | Freq: Every day | ORAL | 0 refills | Status: DC

## 2016-04-19 MED ORDER — ALBUTEROL SULFATE HFA 108 (90 BASE) MCG/ACT IN AERS: 2.0000 | INHALATION_SPRAY | Freq: Four times a day (QID) | RESPIRATORY_TRACT | 3 refills | Status: AC | PRN

## 2016-04-19 MED ORDER — ACETAMINOPHEN 325 MG PO TABS: 650.0000 mg | ORAL_TABLET | Freq: Four times a day (QID) | ORAL | 0 refills | Status: DC | PRN

## 2016-04-19 MED ORDER — IPRATROPIUM-ALBUTEROL 0.5-2.5 (3) MG/3ML IN SOLN: 3.0000 mL | RESPIRATORY_TRACT | 3 refills | Status: AC | PRN

## 2016-04-19 MED ORDER — DILTIAZEM HCL ER COATED BEADS 180 MG PO CP24: 180.0000 mg | ORAL_CAPSULE | Freq: Every day | ORAL | 0 refills | Status: DC

## 2016-04-19 MED ORDER — PREDNISONE 50 MG PO TABS: 50.0000 mg | ORAL_TABLET | Freq: Every day | ORAL | 0 refills | Status: DC

## 2016-04-19 MED ORDER — SYMBICORT 160-4.5 MCG/ACT IN AERO: 1.0000 | INHALATION_SPRAY | Freq: Two times a day (BID) | RESPIRATORY_TRACT | 3 refills | Status: AC

## 2016-04-19 NOTE — Progress Notes (Signed)
Pt. Made aware to call if needed.

## 2016-04-19 NOTE — Discharge Summary (Addendum)
Physician Discharge Summary  Cap Massi ZOX:[id number] DOB: 04/26/1965 DOA: 04/16/2016  PCP: Keturah Barre [facility]  Admit date: 04/16/2016 Discharge date: 04/19/2016  Recommendations for Outpatient Follow-up:  Continue Levaquin for 7 days on discharge. Continue prednisone for 5 days on discharge.  Discharge Diagnoses:  Principal Problem:   Acute on chronic respiratory failure with hypoxia (HCC) Active Problems:   OSA on CPAP   Essential hypertension   Sarcoidosis (HCC)   Acute respiratory failure with hypoxia (HCC)    Discharge Condition: stable   Diet recommendation: as tolerated   History of present illness:  51 year old with sarcoidosis, bullous disease (never smoked cigarettes), sleep apnea with chronic respiratory failure on 5 L oxygen at home who presented to [facility] with 1 week history of shortness of breath with wheezing and productive cough without fever or chills. CXR was negative for airspace disease. Admitted for acute on chronic respiratory failure with possible bronchitis versus pneumonia in the setting of sarcoidosis. He's been treated for COPD with IV antibiotics/steroids with bronchodilators.  Hospital Course:   Acute on chronic Hypoxic respiratory failure / possible community-acquired pneumonia, unspecified organism Due to sarcoidosis/bulous disese We'll continue nebulizer treatments on discharge We'll continue Levaquin for 7 days on discharge for possible pneumonia Continue prednisone for 5 days on discharge  Sleep apnea: Cont CPAP  Drug Abuse: uds pos(+) for cocaine/benzos Drug abuse counselling Avoid narcotics/ nonselective beta blockers    DVT prophylaxis: lovenox Code Status: Full Family Communication: no family at the bedside this am    Consultants:   PCCM  Antimicrobials:   Levaquin   Signed:  Manson Passey, MD  [facility] 04/19/2016, 11:33 AM  Pager #: [phone]  Time spent in minutes: less than 30  minutes    Discharge Exam: Vitals:   04/18/16 2115 04/19/16 0510  BP: 116/77 116/80  Pulse: 89 83  Resp: 18 18  Temp: 98.1 F (36.7 C) 97.5 F (36.4 C)   Vitals:   04/18/16 1955 04/18/16 2115 04/19/16 0510 04/19/16 0827  BP:  116/77 116/80   Pulse: 91 89 83   Resp: Temp:  98.1 F (36.7 C) 97.5 F (36.4 C)   TempSrc:  Oral Oral   SpO2: 100% 98% 100% 98%  Weight:      Height:        General: Pt is alert, follows commands appropriately, not in acute distress Cardiovascular: Regular rate and rhythm, S1/S2 + Respiratory: diminished, crackles  Abdominal: Soft, non tender, non distended, bowel sounds +, no guarding Extremities: no edema, no cyanosis, pulses palpable bilaterally DP and PT Neuro: Grossly nonfocal  Discharge Instructions  Discharge Instructions    Call MD for:  persistant nausea and vomiting    Complete by:  As directed    Call MD for:  redness, tenderness, or signs of infection (pain, swelling, redness, odor or green/yellow discharge around incision site)    Complete by:  As directed    Call MD for:  severe uncontrolled pain    Complete by:  As directed    Diet - low sodium heart healthy    Complete by:  As directed    Discharge instructions    Complete by:  As directed    Continue Levaquin for 7 days on discharge. Continue prednisone for 5 days on discharge.   Increase activity slowly    Complete by:  As directed      Allergies as of 04/19/2016      Reactions  Clindamycin/lincomycin Anaphylaxis, Swelling   Made face and tongue swell   Clindamycin/lincomycin Swelling   Facial and tongue swelling      Medication List    STOP taking these medications   bisoprolol 10 MG tablet Commonly known as:  ZEBETA     TAKE these medications   acetaminophen 325 MG tablet Commonly known as:  TYLENOL Take 2 tablets (650 mg total) by mouth every 6 (six) hours as needed for mild pain (or Fever >/= 101).   albuterol 108 (90 Base) MCG/ACT  inhaler Commonly known as:  PROVENTIL HFA;VENTOLIN HFA Inhale 2 puffs into the lungs every 6 (six) hours as needed for wheezing or shortness of breath. What changed:  Another medication with the same name was removed. Continue taking this medication, and follow the directions you see here.   BENADRYL 25 MG tablet Generic drug:  diphenhydrAMINE Take 25 mg by mouth every 6 (six) hours as needed for itching or allergies.   bisacodyl 5 MG EC tablet Commonly known as:  DULCOLAX Take 5 mg by mouth daily as needed for moderate constipation.   calcium-vitamin D 500-200 MG-UNIT tablet Commonly known as:  OSCAL WITH D Take 1 tablet by mouth daily.   cyclobenzaprine 10 MG tablet Commonly known as:  FLEXERIL Take 10 mg by mouth 3 (three) times daily as needed for muscle spasms.   diltiazem 180 MG 24 hr capsule Commonly known as:  CARDIZEM CD Take 1 capsule (180 mg total) by mouth at bedtime. What changed:  medication strength  how much to take  Another medication with the same name was removed. Continue taking this medication, and follow the directions you see here.   ferrous sulfate 325 (65 FE) MG tablet Take 325 mg by mouth 2 (two) times daily with a meal.   guaiFENesin 600 MG 12 hr tablet Commonly known as:  MUCINEX Take 600 mg by mouth 2 (two) times daily as needed for cough or to loosen phlegm.   ipratropium-albuterol 0.5-2.5 (3) MG/3ML Soln Commonly known as:  DUONEB Take 3 mLs by nebulization every 4 (four) hours as needed.   levofloxacin 500 MG tablet Commonly known as:  LEVAQUIN Take 1 tablet (500 mg total) by mouth daily.   magnesium citrate Soln Take 1 Bottle by mouth daily as needed for mild constipation, moderate constipation or severe constipation.   multivitamin with minerals tablet Take 1 tablet by mouth every morning.   omeprazole 40 MG capsule Commonly known as:  PRILOSEC Take 40 mg by mouth 2 (two) times daily.   ondansetron 4 MG tablet Commonly known  as:  ZOFRAN Take 4 mg by mouth every 8 (eight) hours as needed for nausea or vomiting.   OXYGEN Inhale 4 L into the lungs continuous.   polyethylene glycol packet Commonly known as:  MIRALAX / GLYCOLAX Take 17 g by mouth daily as needed (constipation).   predniSONE 50 MG tablet Commonly known as:  DELTASONE Take 1 tablet (50 mg total) by mouth daily with breakfast.   saline Gel Place 1 application into the nose every 4 (four) hours as needed (dryness/irritation).   SYMBICORT 160-4.5 MCG/ACT inhaler Generic drug:  budesonide-formoterol Inhale 1 puff into the lungs 2 (two) times daily.   Vitamin D2 400 units Tabs Take 400 Units by mouth daily.       Follow-up Information    [facility]. Schedule an appointment as soon as possible for a visit in 1 week(s).   Contact information: [facility]  [place] [address] [phone]            The results of significant diagnostics from this hospitalization (including imaging, microbiology, ancillary and laboratory) are listed below for reference.    Significant Diagnostic Studies: Dg Chest 2 View  Result Date: 04/17/2016 CLINICAL DATA:  51 year old male with a history of shortness of breath. Known sarcoidosis. EXAM: CHEST  2 VIEW COMPARISON:  Multiple prior comparison, with CT 09/30/2014. Most recent plain film 04/16/2016 FINDINGS: Cardiomediastinal silhouette unchanged. Heart borders partially obscured by overlying lung/ pleural disease. Compared to the prior chest x-ray studies, there is more pronounced reticulonodular opacity in the left mid lung. Blunting of the bilateral costophrenic sulci, similar to prior. Diffuse architectural distortion, distributed evenly from superior to inferior, with no predominance. Chronic changes better characterized on prior CT study in this patient with known sarcoid. No displaced fracture. IMPRESSION: Advanced chronic lung disease bilaterally in this patient with  known sarcoidosis, with increasing reticulonodular opacity of the left mid lung. Superimposed acute infection cannot be excluded. Electronically Signed   By: Gilmer Mor D.O.   On: 04/17/2016 09:38   Dg Chest 2 View  Result Date: 04/16/2016 CLINICAL DATA:  Worsening dyspnea over the past 3 days, much worse tonight. EXAM: CHEST  2 VIEW COMPARISON:  11/24/2015 FINDINGS: Severe chronic fibrotic changes consistent with the described history of sarcoidosis. No superimposed acute consolidation. No effusion. Unchanged hilar and mediastinal contours. IMPRESSION: Severe chronic fibrotic changes consistent with sarcoidosis. No superimposed acute consolidation or effusion. Electronically Signed   By: Ellery Plunk M.D.   On: 04/16/2016 01:35    Microbiology: Recent Results (from the past 240 hour(s))  MRSA PCR Screening     Status: None   Collection Time: 04/16/16 10:00 AM  Result Value Ref Range Status   MRSA by PCR NEGATIVE NEGATIVE Final    Comment:        The GeneXpert MRSA Assay (FDA approved for NASAL specimens only), is one component of a comprehensive MRSA colonization surveillance program. It is not intended to diagnose MRSA infection nor to guide or monitor treatment for MRSA infections.   Respiratory Panel by PCR     Status: None   Collection Time: 04/16/16 12:18 PM  Result Value Ref Range Status   Adenovirus NOT DETECTED NOT DETECTED Final   Coronavirus 229E NOT DETECTED NOT DETECTED Final   Coronavirus HKU1 NOT DETECTED NOT DETECTED Final   Coronavirus NL63 NOT DETECTED NOT DETECTED Final   Coronavirus OC43 NOT DETECTED NOT DETECTED Final   Metapneumovirus NOT DETECTED NOT DETECTED Final   Rhinovirus / Enterovirus NOT DETECTED NOT DETECTED Final   Influenza A NOT DETECTED NOT DETECTED Final   Influenza B NOT DETECTED NOT DETECTED Final   Parainfluenza Virus 1 NOT DETECTED NOT DETECTED Final   Parainfluenza Virus 2 NOT DETECTED NOT DETECTED Final   Parainfluenza Virus 3  NOT DETECTED NOT DETECTED Final   Parainfluenza Virus 4 NOT DETECTED NOT DETECTED Final   Respiratory Syncytial Virus NOT DETECTED NOT DETECTED Final   Bordetella pertussis NOT DETECTED NOT DETECTED Final   Chlamydophila pneumoniae NOT DETECTED NOT DETECTED Final   Mycoplasma pneumoniae NOT DETECTED NOT DETECTED Final    Comment: Performed at [facility], [address], [place], [place] [address]  Culture, expectorated sputum-assessment     Status: None   Collection Time: 04/18/16  4:57 PM  Result Value Ref Range Status   Specimen Description SPUTUM  Final   Special Requests NONE  Final   Sputum evaluation THIS SPECIMEN IS ACCEPTABLE FOR SPUTUM CULTURE  Final   Report Status 04/18/2016 FINAL  Final  Culture, respiratory (NON-Expectorated)     Status: None (Preliminary result)   Collection Time: 04/18/16  4:57 PM  Result Value Ref Range Status   Specimen Description SPUTUM  Final   Special Requests NONE Reflexed from [id number]  Final   Gram Stain   Final    MODERATE SQUAMOUS EPITHELIAL CELLS PRESENT RARE WBC PRESENT, PREDOMINANTLY PMN MODERATE GRAM POSITIVE COCCI IN CLUSTERS MODERATE IN CHAINS IN PAIRS MODERATE GRAM NEGATIVE RODS FEW GRAM POSITIVE RODS    Culture   Final    CULTURE REINCUBATED FOR BETTER GROWTH Performed at [facility], [address], [place], [place] [address]    Report Status PENDING  Incomplete     Labs: Basic Metabolic Panel:  Recent Labs Lab 04/16/16 0126 04/17/16 0401 04/19/16 0502  NA 137 138 138  K 4.1 4.9 4.3  CL 100* 102 101  CO2 GLUCOSE 113* 112* 159*  BUN CREATININE 0.92 0.98 0.92  CALCIUM 9.5 9.8 9.2   Liver Function Tests: No results for input(s): AST, ALT, ALKPHOS, BILITOT, PROT, ALBUMIN in the last 168 hours. No results for input(s): LIPASE, AMYLASE in the last 168 hours. No results for input(s): AMMONIA in the last 168 hours. CBC:  Recent Labs Lab 04/16/16 0126 04/17/16 0401 04/19/16 0502   WBC 7.1 6.7 13.5*  HGB 12.9* 11.9* 12.3*  HCT 38.6* 36.3* 37.6*  MCV 82.8 83.3 81.9  PLT 272 301 339   Cardiac Enzymes:  Recent Labs Lab 04/16/16 1025  TROPONINI <0.03   BNP: BNP (last 3 results)  Recent Labs  01/12/16 1553 04/16/16 0537  BNP 14.8 69.8    ProBNP (last 3 results) No results for input(s): PROBNP in the last 8760 hours.  CBG: No results for input(s): GLUCAP in the last 168 hours.

## 2016-04-19 NOTE — Discharge Instructions (Signed)
Shortness of Breath, Adult  Shortness of breath means you have trouble breathing. Your lungs are organs for breathing.  Follow these instructions at home:  Pay attention to any changes in your symptoms. Take these actions to help with your condition:  ? Do not smoke. Smoking can cause shortness of breath. If you need help to quit smoking, ask your doctor.  ? Avoid things that can make it harder to breathe, such as:  ? Mold.  ? Dust.  ? Air pollution.  ? Chemical smells.  ? Things that can cause allergy symptoms (allergens), if you have allergies.  ? Keep your living space clean and free of mold and dust.  ? Rest as needed. Slowly return to your usual activities.  ? Take over-the-counter and prescription medicines, including oxygen and inhaled medicines, only as told by your doctor.  ? Keep all follow-up visits as told by your doctor. This is important.  Contact a doctor if:  ? Your condition does not get better as soon as expected.  ? You have a hard time doing your normal activities, even after you rest.  ? You have new symptoms.  Get help right away if:  ? You have trouble breathing when you are resting.  ? You feel light-headed or you faint.  ? You have a cough that is not helped by medicines.  ? You cough up blood.  ? You have pain with breathing.  ? You have pain in your chest, arms, shoulders, or belly (abdomen).  ? You have a fever.  ? You cannot walk up stairs.  ? You cannot exercise the way you normally do.  This information is not intended to replace advice given to you by your health care provider. Make sure you discuss any questions you have with your health care provider.  Document Released: 06/16/2007 Document Revised: 01/15/2016 Document Reviewed: 01/15/2016  Elsevier Interactive Patient Education ? 2017 [employer]

## 2016-04-19 NOTE — Progress Notes (Signed)
Pt refused CPT Vest for now. He says he will do it later.

## 2016-04-21 LAB — CULTURE, RESPIRATORY: CULTURE: NORMAL

## 2016-04-21 LAB — CULTURE, RESPIRATORY W GRAM STAIN

## 2016-04-30 ENCOUNTER — Emergency Department (HOSPITAL_COMMUNITY): Payer: Commercial Managed Care - HMO

## 2016-04-30 ENCOUNTER — Inpatient Hospital Stay (HOSPITAL_COMMUNITY)
Admission: EM | Admit: 2016-04-30 | Discharge: 2016-05-07 | DRG: 189 | Disposition: A | Discharge status: Home / Self Care | Payer: Commercial Managed Care - HMO | Attending: Internal Medicine | Admitting: Internal Medicine

## 2016-04-30 ENCOUNTER — Encounter (HOSPITAL_COMMUNITY): Payer: Self-pay

## 2016-04-30 DIAGNOSIS — J9621 Acute and chronic respiratory failure with hypoxia: Secondary | ICD-10-CM | POA: Diagnosis present

## 2016-04-30 DIAGNOSIS — R0600 Dyspnea, unspecified: Secondary | ICD-10-CM

## 2016-04-30 DIAGNOSIS — Z833 Family history of diabetes mellitus: Secondary | ICD-10-CM

## 2016-04-30 DIAGNOSIS — I110 Hypertensive heart disease with heart failure: Secondary | ICD-10-CM | POA: Diagnosis present

## 2016-04-30 DIAGNOSIS — I10 Essential (primary) hypertension: Secondary | ICD-10-CM | POA: Diagnosis present

## 2016-04-30 DIAGNOSIS — R000 Tachycardia, unspecified: Secondary | ICD-10-CM | POA: Diagnosis present

## 2016-04-30 DIAGNOSIS — F1410 Cocaine abuse, uncomplicated: Secondary | ICD-10-CM | POA: Diagnosis present

## 2016-04-30 DIAGNOSIS — J441 Chronic obstructive pulmonary disease with (acute) exacerbation: Secondary | ICD-10-CM | POA: Diagnosis present

## 2016-04-30 DIAGNOSIS — Z9981 Dependence on supplemental oxygen: Secondary | ICD-10-CM

## 2016-04-30 DIAGNOSIS — Z9989 Dependence on other enabling machines and devices: Secondary | ICD-10-CM

## 2016-04-30 DIAGNOSIS — K219 Gastro-esophageal reflux disease without esophagitis: Secondary | ICD-10-CM | POA: Diagnosis present

## 2016-04-30 DIAGNOSIS — J189 Pneumonia, unspecified organism: Secondary | ICD-10-CM | POA: Diagnosis present

## 2016-04-30 DIAGNOSIS — D860 Sarcoidosis of lung: Secondary | ICD-10-CM | POA: Diagnosis present

## 2016-04-30 DIAGNOSIS — G4733 Obstructive sleep apnea (adult) (pediatric): Secondary | ICD-10-CM | POA: Diagnosis present

## 2016-04-30 DIAGNOSIS — I5032 Chronic diastolic (congestive) heart failure: Secondary | ICD-10-CM | POA: Diagnosis present

## 2016-04-30 DIAGNOSIS — Z79899 Other long term (current) drug therapy: Secondary | ICD-10-CM

## 2016-04-30 DIAGNOSIS — Z7951 Long term (current) use of inhaled steroids: Secondary | ICD-10-CM

## 2016-04-30 HISTORY — DX: Gastro-esophageal reflux disease without esophagitis: K21.9

## 2016-04-30 MED ORDER — ALBUTEROL SULFATE (2.5 MG/3ML) 0.083% IN NEBU
5.0000 mg | INHALATION_SOLUTION | Freq: Once | RESPIRATORY_TRACT | Status: AC
  Administered 2016-04-30: 5 mg via RESPIRATORY_TRACT
  Filled 2016-04-30: qty 6

## 2016-04-30 NOTE — ED Triage Notes (Signed)
Pt reports SOB. He reports hx of CHF, COPD, and sarcoidosis. Pt unable to speak in complete sentences, using accessory muscles and was in a tripod position in triage. He reports being on 4L at home. A&Ox4 at this time.

## 2016-04-30 NOTE — ED Provider Notes (Signed)
[facility]-EMERGENCY DEPT Provider Note   CSN: [id number] Arrival date & time: 04/30/16  2327  By signing my name below, I, Cynda Acres, attest that this documentation has been prepared under the direction and in the presence of Baudelia Schroepfer, MD. Electronically Signed: Cynda Acres, Scribe. 04/30/16. 12:04 AM.  History   Chief Complaint Chief Complaint  Patient presents with  . Shortness of Breath    HPI Comments: Cullen Lahaie is a 51 y.o. male with a history of CHF, COPD, and sarcoidosis, who presents to the Emergency Department complaining of sudden-onset shortness of breath that began 2 days ago. Patient is noted to be on 4L of oxygen chronically. Patient was admitted here two weeks ago, in which he was discharged with steroids. Patient reports using his nebulizer at home every 4 hours. Patient denies any nausea, vomiting, or chest pain.   The history is provided by the patient and the spouse. The history is limited by the condition of the patient. No language interpreter was used.  Shortness of Breath  This is a new problem. The problem occurs frequently.The current episode started 2 days ago. The problem has been gradually worsening. Associated symptoms include wheezing. Pertinent negatives include no fever, no chest pain, no vomiting and no leg swelling. It is unknown what precipitated the problem. He has tried inhaled steroids for the symptoms. The treatment provided no relief. He has had prior hospitalizations. He has had prior ED visits. He has had no prior ICU admissions. Associated medical issues include COPD and heart failure. Associated medical issues do not include recent surgery.    Past Medical History:  Diagnosis Date  . Bronchitis   . CHF (congestive heart failure) (HCC)   . Chronic respiratory failure (HCC)    home O@ 4L (01/28/14)  . Chronic respiratory failure (HCC)   . COPD (chronic obstructive pulmonary disease) (HCC)   . Hypertension   . Neuromuscular disorder  (HCC)   . OSA on CPAP   . Pneumonia   . Sarcoidosis     Patient Active Problem List   Diagnosis Date Noted  . Acute respiratory failure with hypoxia (HCC) 04/16/2016  . Sarcoidosis 01/13/2016  . Hypertension 01/13/2016  . Chronic respiratory failure (HCC) 01/13/2016  . Cocaine abuse 01/13/2016  . Respiratory distress 01/12/2016  . Epistaxis 09/19/2015  . Hyperkalemia   . Sarcoidosis 09/01/2014  . SOB (shortness of breath) 03/23/2014  . Essential hypertension   . Chronic respiratory failure with hypoxia (HCC) 02/11/2014  . Neuropathy 01/26/2014  . Abnormal bruising 01/23/2014  . Foot pain, bilateral 01/23/2014  . Discoloration of skin of toe 01/23/2014  . Acute on chronic respiratory failure with hypoxia (HCC) 01/17/2014  . Chronic pain 11/23/2013  . Abdominal pain, epigastric 08/03/2012  . Nuclear sclerotic cataract 07/06/2012  . Normocytic anemia 06/16/2012  . GERD (gastroesophageal reflux disease) 06/16/2012  . Bronchiectasis without acute exacerbation (HCC) 03/05/2012  . Hyperglycemia, drug-induced 07/27/2011  . Error, refractive, myopia 02/15/2011  . Sarcoid (HCC) 01/15/2011  . OSA on CPAP 01/15/2011    Past Surgical History:  Procedure Laterality Date  . arm surgery     . ROTATOR CUFF REPAIR    . VIDEO ASSISTED THORACOSCOPY (VATS)/THOROCOTOMY Left 07/17/2012   Procedure: VIDEO ASSISTED THORACOSCOPY (VATS)/BLEB Stapling;  Surgeon: Alleen Borne, MD;  Location: [facility] OR;  Service: Thoracic;  Laterality: Left;       Home Medications    Prior to Admission medications   Medication Sig Start Date End Date Taking? Authorizing Provider  acetaminophen (TYLENOL) 325 MG tablet Take 2 tablets (650 mg total) by mouth every 6 (six) hours as needed for mild pain (or Fever >/= 101). 04/19/16   Alison Murray, MD  albuterol (PROVENTIL HFA;VENTOLIN HFA) 108 (90 Base) MCG/ACT inhaler Inhale 2 puffs into the lungs every 6 (six) hours as needed for wheezing or shortness of breath.  04/19/16   Alison Murray, MD  bisacodyl (DULCOLAX) 5 MG EC tablet Take 5 mg by mouth daily as needed for moderate constipation.    Historical Provider, MD  calcium-vitamin D (OSCAL WITH D) 500-200 MG-UNIT tablet Take 1 tablet by mouth daily.    Historical Provider, MD  cyclobenzaprine (FLEXERIL) 10 MG tablet Take 10 mg by mouth 3 (three) times daily as needed for muscle spasms.    Historical Provider, MD  diltiazem (CARDIZEM CD) 180 MG 24 hr capsule Take 1 capsule (180 mg total) by mouth at bedtime. 04/19/16   Alison Murray, MD  diphenhydrAMINE (BENADRYL) 25 MG tablet Take 25 mg by mouth every 6 (six) hours as needed for itching or allergies.    Historical Provider, MD  Ergocalciferol (VITAMIN D2) 400 units TABS Take 400 Units by mouth daily. 03/18/08   Historical Provider, MD  ferrous sulfate 325 (65 FE) MG tablet Take 325 mg by mouth 2 (two) times daily with a meal.    Historical Provider, MD  guaiFENesin (MUCINEX) 600 MG 12 hr tablet Take 600 mg by mouth 2 (two) times daily as needed for cough or to loosen phlegm.    Historical Provider, MD  ipratropium-albuterol (DUONEB) 0.5-2.5 (3) MG/3ML SOLN Take 3 mLs by nebulization every 4 (four) hours as needed. 04/19/16   Alison Murray, MD  levofloxacin (LEVAQUIN) 500 MG tablet Take 1 tablet (500 mg total) by mouth daily. 04/19/16   Alison Murray, MD  magnesium citrate SOLN Take 1 Bottle by mouth daily as needed for mild constipation, moderate constipation or severe constipation.     Historical Provider, MD  Multiple Vitamins-Minerals (MULTIVITAMIN WITH MINERALS) tablet Take 1 tablet by mouth every morning.     Historical Provider, MD  omeprazole (PRILOSEC) 40 MG capsule Take 40 mg by mouth 2 (two) times daily.    Historical Provider, MD  ondansetron (ZOFRAN) 4 MG tablet Take 4 mg by mouth every 8 (eight) hours as needed for nausea or vomiting.    Historical Provider, MD  OXYGEN Inhale 4 L into the lungs continuous.    Historical Provider, MD  polyethylene glycol  (MIRALAX / GLYCOLAX) packet Take 17 g by mouth daily as needed (constipation).    Historical Provider, MD  predniSONE (DELTASONE) 50 MG tablet Take 1 tablet (50 mg total) by mouth daily with breakfast. 04/19/16   Alison Murray, MD  saline (AYR) GEL Place 1 application into the nose every 4 (four) hours as needed (dryness/irritation).    Historical Provider, MD  SYMBICORT 160-4.5 MCG/ACT inhaler Inhale 1 puff into the lungs 2 (two) times daily. 04/19/16   Alison Murray, MD    Family History Family History  Problem Relation Age of Onset  . Diabetes Father     Social History Social History  Substance Use Topics  . Smoking status: Never Smoker  . Smokeless tobacco: Never Used  . Alcohol use Yes     Comment: social     Allergies   Clindamycin/lincomycin and Clindamycin/lincomycin   Review of Systems Review of Systems  Constitutional: Negative for fever.  HENT: Negative for congestion.  Eyes: Negative for photophobia.  Respiratory: Positive for shortness of breath and wheezing.   Cardiovascular: Negative for chest pain, palpitations and leg swelling.  Gastrointestinal: Negative for nausea and vomiting.  All other systems reviewed and are negative.    Physical Exam Updated Vital Signs BP 128/72 (BP Location: Left Arm)   Pulse (!) 125   Temp 98.4 F (36.9 C) (Oral)   Resp (!) 24   SpO2 99%   Physical Exam  Constitutional: He is oriented to person, place, and time. He appears well-developed and well-nourished. He appears distressed.  HENT:  Head: Normocephalic and atraumatic.  Mouth/Throat: Oropharynx is clear and moist.  Eyes: Conjunctivae and EOM are normal. Pupils are equal, round, and reactive to light.  Neck: Normal range of motion. Neck supple.  No peruse.   Cardiovascular: Regular rhythm.   Tachycardia.   Pulmonary/Chest: Effort normal. No stridor. He has wheezes. He has no rales.  Bilateral wheezes, worse on the right. Systolic ejection  Murmur 3/6  Abdominal:  Soft. Bowel sounds are normal. He exhibits no mass. There is no tenderness. There is no rebound and no guarding.  Constipation.   Musculoskeletal: Normal range of motion. He exhibits no edema, tenderness or deformity.  Intact distal pulses. All compartments are soft, no edema.   Neurological: He is alert and oriented to person, place, and time. He displays normal reflexes.  Skin: Skin is warm and dry. Capillary refill takes less than 2 seconds.  Psychiatric: Thought content normal.  Nursing note and vitals reviewed.    ED Treatments / Results   Vitals:   05/01/16 0201 05/01/16 0230  BP: (!) 141/119 (!) 131/102  Pulse: (!) 120 (!) 111  Resp: (!) 21 16  Temp:      DIAGNOSTIC STUDIES: Oxygen Saturation is 99% on RA, normal by my interpretation.    COORDINATION OF CARE: 12:03 AM Discussed treatment plan with pt at bedside and pt agreed to plan, which includes a breathing treatment.    Labs (all labs ordered are listed, but only abnormal results are displayed)  Results for orders placed or performed during the hospital encounter of 04/30/16  CBC with Differential/Platelet  Result Value Ref Range   WBC 13.5 (H) 4.0 - 10.5 K/uL   RBC 4.51 4.22 - 5.81 MIL/uL   Hemoglobin 12.4 (L) 13.0 - 17.0 g/dL   HCT 16.1 (L) 09.6 - 04.5 %   MCV 85.4 78.0 - 100.0 fL   MCH 27.5 26.0 - 34.0 pg   MCHC 32.2 30.0 - 36.0 g/dL   RDW 40.9 81.1 - 91.4 %   Platelets 254 150 - 400 K/uL   Neutrophils Relative % 84 %   Neutro Abs 11.4 (H) 1.7 - 7.7 K/uL   Lymphocytes Relative 5 %   Lymphs Abs 0.7 0.7 - 4.0 K/uL   Monocytes Relative 8 %   Monocytes Absolute 1.1 (H) 0.1 - 1.0 K/uL   Eosinophils Relative 3 %   Eosinophils Absolute 0.4 0.0 - 0.7 K/uL   Basophils Relative 0 %   Basophils Absolute 0.0 0.0 - 0.1 K/uL  I-Stat Chem 8, ED  Result Value Ref Range   Sodium 136 135 - 145 mmol/L   Potassium 4.1 3.5 - 5.1 mmol/L   Chloride 100 (L) 101 - 111 mmol/L   BUN 11 6 - 20 mg/dL   Creatinine, Ser 7.82  0.61 - 1.24 mg/dL   Glucose, Bld 956 (H) 65 - 99 mg/dL   Calcium, Ion 2.13 (L) 1.15 -  1.40 mmol/L   TCO2 34 0 - 100 mmol/L   Hemoglobin 12.9 (L) 13.0 - 17.0 g/dL   HCT 71.0 (L) 62.6 - 94.8 %  I-stat troponin, ED  Result Value Ref Range   Troponin i, poc 0.00 0.00 - 0.08 ng/mL   Comment 3           Dg Chest 2 View  Result Date: 05/01/2016 CLINICAL DATA:  Acute onset of shortness of breath and left-sided to central chest pain. Initial encounter. EXAM: CHEST  2 VIEW COMPARISON:  Chest radiograph performed 04/17/2016 FINDINGS: The lungs are well-aerated. Bilateral changes of sarcoidosis are again noted, with a large underlying right upper lobe bulla. Superimposed left basilar pneumonia is a concern. Small bilateral pleural effusions are suspected. No pneumothorax is seen. The heart is normal in size; the mediastinal contour is within normal limits. No acute osseous abnormalities are seen. IMPRESSION: Changes of sarcoidosis again noted, with large underlying right upper lobe bulla. Superimposed left basilar pneumonia is a concern. Suspect small bilateral pleural effusions. Electronically Signed   By: Roanna Raider M.D.   On: 05/01/2016 00:07   Dg Chest 2 View  Result Date: 04/17/2016 CLINICAL DATA:  51 year old male with a history of shortness of breath. Known sarcoidosis. EXAM: CHEST  2 VIEW COMPARISON:  Multiple prior comparison, with CT 09/30/2014. Most recent plain film 04/16/2016 FINDINGS: Cardiomediastinal silhouette unchanged. Heart borders partially obscured by overlying lung/ pleural disease. Compared to the prior chest x-ray studies, there is more pronounced reticulonodular opacity in the left mid lung. Blunting of the bilateral costophrenic sulci, similar to prior. Diffuse architectural distortion, distributed evenly from superior to inferior, with no predominance. Chronic changes better characterized on prior CT study in this patient with known sarcoid. No displaced fracture. IMPRESSION:  Advanced chronic lung disease bilaterally in this patient with known sarcoidosis, with increasing reticulonodular opacity of the left mid lung. Superimposed acute infection cannot be excluded. Electronically Signed   By: Gilmer Mor D.O.   On: 04/17/2016 09:38   Dg Chest 2 View  Result Date: 04/16/2016 CLINICAL DATA:  Worsening dyspnea over the past 3 days, much worse tonight. EXAM: CHEST  2 VIEW COMPARISON:  11/24/2015 FINDINGS: Severe chronic fibrotic changes consistent with the described history of sarcoidosis. No superimposed acute consolidation. No effusion. Unchanged hilar and mediastinal contours. IMPRESSION: Severe chronic fibrotic changes consistent with sarcoidosis. No superimposed acute consolidation or effusion. Electronically Signed   By: Ellery Plunk M.D.   On: 04/16/2016 01:35     EKG Interpretation  Date/Time:  Friday Trevione Wert 20 2018 23:49:58 EDT Ventricular Rate:  121 PR Interval:    QRS Duration: 81 QT Interval:  323 QTC Calculation: 455 R Axis:   104 Text Interpretation:  Sinus tachycardia baseline wander Confirmed by [facility]  MD, Shardae Kleinman ([id number]) on 04/30/2016 11:57:29 PM        Procedures Procedures (including critical care time)  Medications Ordered in ED  Medications  magnesium sulfate IVPB 2 g 50 mL (2 g Intravenous New Bag/Given 05/01/16 0157)  albuterol (PROVENTIL,VENTOLIN) solution continuous neb (10 mg/hr Nebulization New Bag/Given 05/01/16 0153)  albuterol (PROVENTIL) (2.5 MG/3ML) 0.083% nebulizer solution 5 mg (5 mg Nebulization Given 04/30/16 2356)  vancomycin (VANCOCIN) IVPB 1000 mg/200 mL premix (0 mg Intravenous Stopped 05/01/16 0157)  piperacillin-tazobactam (ZOSYN) IVPB 3.375 g (0 g Intravenous Stopped 05/01/16 0140)  methylPREDNISolone sodium succinate (SOLU-MEDROL) 125 mg/2 mL injection 125 mg (125 mg Intravenous Given 05/01/16 0157)    MDM Reviewed: previous chart, nursing note  and vitals Reviewed previous: labs and x-ray Interpretation:  labs, ECG and x-ray (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  elevated white count by me negative troponin ) Total time providing critical care: 75-105 minutes. This excludes time spent performing separately reportable procedures and services. Consults: admitting MD   CRITICAL CARE Performed by: Jasmine Awe Total critical care time: 90 minutes Critical care time was exclusive of separately billable procedures and treating other patients. Critical care was necessary to treat or prevent imminent or life-threatening deterioration. Critical care was time spent personally by me on the following activities: development of treatment plan with patient and/or surrogate as well as nursing, discussions with consultants, evaluation of patient's response to treatment, examination of patient, obtaining history from patient or surrogate, ordering and performing treatments and interventions, ordering and review of laboratory studies, ordering and review of radiographic studies, pulse oximetry and re-evaluation of patient's condition.  2:31 AM  Case discussed with Dr. Clyde Lundborg.   Placed on BIPAP by me   Final Clinical Impressions(s) / ED Diagnoses  PNA and sarcoidosis:  Will admit to stepdown for further care as is on BIPAP  I personally performed the services described in this documentation, which was scribed in my presence. The recorded information has been reviewed and is accurate.        Cy Blamer, MD 05/01/16 [phone]

## 2016-05-01 ENCOUNTER — Encounter (HOSPITAL_COMMUNITY): Payer: Self-pay | Admitting: Emergency Medicine

## 2016-05-01 DIAGNOSIS — F1410 Cocaine abuse, uncomplicated: Secondary | ICD-10-CM | POA: Diagnosis present

## 2016-05-01 DIAGNOSIS — J9621 Acute and chronic respiratory failure with hypoxia: Principal | ICD-10-CM

## 2016-05-01 DIAGNOSIS — D869 Sarcoidosis, unspecified: Secondary | ICD-10-CM | POA: Diagnosis not present

## 2016-05-01 DIAGNOSIS — I10 Essential (primary) hypertension: Secondary | ICD-10-CM | POA: Diagnosis not present

## 2016-05-01 DIAGNOSIS — I110 Hypertensive heart disease with heart failure: Secondary | ICD-10-CM | POA: Diagnosis present

## 2016-05-01 DIAGNOSIS — I5032 Chronic diastolic (congestive) heart failure: Secondary | ICD-10-CM | POA: Diagnosis present

## 2016-05-01 DIAGNOSIS — D860 Sarcoidosis of lung: Secondary | ICD-10-CM | POA: Diagnosis present

## 2016-05-01 DIAGNOSIS — Z7951 Long term (current) use of inhaled steroids: Secondary | ICD-10-CM | POA: Diagnosis not present

## 2016-05-01 DIAGNOSIS — Z79899 Other long term (current) drug therapy: Secondary | ICD-10-CM | POA: Diagnosis not present

## 2016-05-01 DIAGNOSIS — J189 Pneumonia, unspecified organism: Secondary | ICD-10-CM | POA: Diagnosis not present

## 2016-05-01 DIAGNOSIS — Z9981 Dependence on supplemental oxygen: Secondary | ICD-10-CM | POA: Diagnosis not present

## 2016-05-01 DIAGNOSIS — G4733 Obstructive sleep apnea (adult) (pediatric): Secondary | ICD-10-CM

## 2016-05-01 DIAGNOSIS — J441 Chronic obstructive pulmonary disease with (acute) exacerbation: Secondary | ICD-10-CM | POA: Diagnosis present

## 2016-05-01 DIAGNOSIS — Z9989 Dependence on other enabling machines and devices: Secondary | ICD-10-CM | POA: Diagnosis not present

## 2016-05-01 DIAGNOSIS — K219 Gastro-esophageal reflux disease without esophagitis: Secondary | ICD-10-CM

## 2016-05-01 DIAGNOSIS — R000 Tachycardia, unspecified: Secondary | ICD-10-CM | POA: Diagnosis present

## 2016-05-01 DIAGNOSIS — Z833 Family history of diabetes mellitus: Secondary | ICD-10-CM | POA: Diagnosis not present

## 2016-05-01 LAB — CREATININE, SERUM
CREATININE: 0.89 mg/dL (ref 0.61–1.24)
GFR calc Af Amer: >60 mL/min (ref >60)
GFR calc non Af Amer: >60 mL/min (ref >60)

## 2016-05-01 LAB — BLOOD GAS, ARTERIAL
ACID-BASE EXCESS: 4.8 mmol/L — AB (ref 0.0–2.0)
BICARBONATE: 31.2 mmol/L — AB (ref 20.0–28.0)
Delivery systems: POSITIVE
Drawn by: 225631
EXPIRATORY PAP: 8
FIO2: 50
INSPIRATORY PAP: 14
Mode: POSITIVE
O2 Saturation: 98.3 %
PATIENT TEMPERATURE: 98.6
PH ART: 7.356 (ref 7.350–7.450)
PO2 ART: 127 mmHg — AB (ref 83.0–108.0)
pCO2 arterial: 57.2 mmHg — ABNORMAL HIGH (ref 32.0–48.0)

## 2016-05-01 LAB — I-STAT CHEM 8, ED
BUN: 11 mg/dL (ref 6–20)
CHLORIDE: 100 mmol/L — AB (ref 101–111)
CREATININE: 0.9 mg/dL (ref 0.61–1.24)
Calcium, Ion: 1.13 mmol/L — ABNORMAL LOW (ref 1.15–1.40)
GLUCOSE: 142 mg/dL — AB (ref 65–99)
HEMATOCRIT: 38 % — AB (ref 39.0–52.0)
Hemoglobin: 12.9 g/dL — ABNORMAL LOW (ref 13.0–17.0)
POTASSIUM: 4.1 mmol/L (ref 3.5–5.1)
Sodium: 136 mmol/L (ref 135–145)
TCO2: 34 mmol/L (ref 0–100)

## 2016-05-01 LAB — CBC WITH DIFFERENTIAL/PLATELET
BASOS ABS: 0 10*3/uL (ref 0.0–0.1)
Basophils Relative: 0 %
Eosinophils Absolute: 0.4 10*3/uL (ref 0.0–0.7)
Eosinophils Relative: 3 %
HEMATOCRIT: 38.5 % — AB (ref 39.0–52.0)
HEMOGLOBIN: 12.4 g/dL — AB (ref 13.0–17.0)
LYMPHS PCT: 5 %
Lymphs Abs: 0.7 10*3/uL (ref 0.7–4.0)
MCH: 27.5 pg (ref 26.0–34.0)
MCHC: 32.2 g/dL (ref 30.0–36.0)
MCV: 85.4 fL (ref 78.0–100.0)
MONO ABS: 1.1 10*3/uL — AB (ref 0.1–1.0)
Monocytes Relative: 8 %
NEUTROS PCT: 84 %
Neutro Abs: 11.4 10*3/uL — ABNORMAL HIGH (ref 1.7–7.7)
Platelets: 254 10*3/uL (ref 150–400)
RBC: 4.51 MIL/uL (ref 4.22–5.81)
RDW: 12.8 % (ref 11.5–15.5)
WBC: 13.5 10*3/uL — AB (ref 4.0–10.5)

## 2016-05-01 LAB — LACTIC ACID, PLASMA
Lactic Acid, Venous: 0.9 mmol/L (ref 0.5–1.9)
Lactic Acid, Venous: 0.9 mmol/L (ref 0.5–1.9)

## 2016-05-01 LAB — BRAIN NATRIURETIC PEPTIDE: B NATRIURETIC PEPTIDE 5: 15 pg/mL (ref 0.0–100.0)

## 2016-05-01 LAB — INFLUENZA PANEL BY PCR (TYPE A & B)
Influenza A By PCR: NEGATIVE
Influenza B By PCR: NEGATIVE

## 2016-05-01 LAB — I-STAT TROPONIN, ED: Troponin i, poc: 0 ng/mL (ref 0.00–0.08)

## 2016-05-01 MED ORDER — BISACODYL 5 MG PO TBEC
5.0000 mg | DELAYED_RELEASE_TABLET | Freq: Every day | ORAL | Status: DC | PRN
  Filled 2016-05-01: qty 1

## 2016-05-01 MED ORDER — VANCOMYCIN HCL IN DEXTROSE 1-5 GM/200ML-% IV SOLN
1000.0000 mg | Freq: Once | INTRAVENOUS | Status: AC
  Administered 2016-05-01: 1000 mg via INTRAVENOUS
  Filled 2016-05-01: qty 200

## 2016-05-01 MED ORDER — DILTIAZEM HCL ER COATED BEADS 120 MG PO CP24
120.0000 mg | ORAL_CAPSULE | Freq: Every day | ORAL | Status: DC
  Administered 2016-05-01 – 2016-05-07 (×7): 120 mg via ORAL
  Filled 2016-05-01 (×7): qty 1

## 2016-05-01 MED ORDER — VANCOMYCIN HCL IN DEXTROSE 1-5 GM/200ML-% IV SOLN
1000.0000 mg | Freq: Two times a day (BID) | INTRAVENOUS | Status: DC
  Administered 2016-05-01 – 2016-05-02 (×4): 1000 mg via INTRAVENOUS
  Filled 2016-05-01 (×4): qty 200

## 2016-05-01 MED ORDER — CALCIUM CARBONATE-VITAMIN D 500-200 MG-UNIT PO TABS
1.0000 | ORAL_TABLET | Freq: Every day | ORAL | Status: DC
  Administered 2016-05-01 – 2016-05-07 (×7): 1 via ORAL
  Filled 2016-05-01 (×7): qty 1

## 2016-05-01 MED ORDER — ALBUTEROL (5 MG/ML) CONTINUOUS INHALATION SOLN
10.0000 mg/h | INHALATION_SOLUTION | RESPIRATORY_TRACT | Status: DC
  Administered 2016-05-01: 10 mg/h via RESPIRATORY_TRACT

## 2016-05-01 MED ORDER — IPRATROPIUM BROMIDE 0.02 % IN SOLN: 0.5000 mg | RESPIRATORY_TRACT | Status: DC

## 2016-05-01 MED ORDER — FERROUS SULFATE 325 (65 FE) MG PO TABS
325.0000 mg | ORAL_TABLET | Freq: Two times a day (BID) | ORAL | Status: DC
Start: 2016-05-01 — End: 2016-05-07
  Administered 2016-05-01 – 2016-05-07 (×12): 325 mg via ORAL
  Filled 2016-05-01 (×14): qty 1

## 2016-05-01 MED ORDER — DIPHENHYDRAMINE HCL 25 MG PO CAPS: 25.0000 mg | ORAL_CAPSULE | Freq: Four times a day (QID) | ORAL | Status: DC | PRN

## 2016-05-01 MED ORDER — ENOXAPARIN SODIUM 40 MG/0.4ML ~~LOC~~ SOLN
40.0000 mg | SUBCUTANEOUS | Status: DC
  Administered 2016-05-01 – 2016-05-07 (×7): 40 mg via SUBCUTANEOUS
  Filled 2016-05-01 (×7): qty 0.4

## 2016-05-01 MED ORDER — CHOLECALCIFEROL 10 MCG (400 UNIT) PO TABS
400.0000 [IU] | ORAL_TABLET | Freq: Every day | ORAL | Status: DC
  Administered 2016-05-01 – 2016-05-07 (×7): 400 [IU] via ORAL
  Filled 2016-05-01 (×7): qty 1

## 2016-05-01 MED ORDER — SALINE SPRAY 0.65 % NA SOLN
1.0000 | NASAL | Status: DC | PRN
  Filled 2016-05-01: qty 44

## 2016-05-01 MED ORDER — ADULT MULTIVITAMIN W/MINERALS CH
1.0000 | ORAL_TABLET | Freq: Every morning | ORAL | Status: DC
  Administered 2016-05-01 – 2016-05-07 (×7): 1 via ORAL
  Filled 2016-05-01 (×7): qty 1

## 2016-05-01 MED ORDER — LEVALBUTEROL HCL 1.25 MG/0.5ML IN NEBU: 1.2500 mg | INHALATION_SOLUTION | Freq: Four times a day (QID) | RESPIRATORY_TRACT | Status: DC

## 2016-05-01 MED ORDER — DEXTROSE 5 % IV SOLN
500.0000 mg | INTRAVENOUS | Status: DC
  Administered 2016-05-01 – 2016-05-02 (×2): 500 mg via INTRAVENOUS
  Filled 2016-05-01 (×4): qty 500

## 2016-05-01 MED ORDER — ZOLPIDEM TARTRATE 5 MG PO TABS: 5.0000 mg | ORAL_TABLET | Freq: Every evening | ORAL | Status: DC | PRN

## 2016-05-01 MED ORDER — IPRATROPIUM BROMIDE 0.02 % IN SOLN
0.5000 mg | RESPIRATORY_TRACT | Status: DC
  Administered 2016-05-01 – 2016-05-04 (×22): 0.5 mg via RESPIRATORY_TRACT
  Filled 2016-05-01 (×23): qty 2.5

## 2016-05-01 MED ORDER — ACETAMINOPHEN 325 MG PO TABS
650.0000 mg | ORAL_TABLET | Freq: Four times a day (QID) | ORAL | Status: DC | PRN
Start: 2016-05-01 — End: 2016-05-07
  Administered 2016-05-01 – 2016-05-06 (×6): 650 mg via ORAL
  Filled 2016-05-01 (×3): qty 2

## 2016-05-01 MED ORDER — ACETAMINOPHEN 325 MG PO TABS
650.0000 mg | ORAL_TABLET | Freq: Four times a day (QID) | ORAL | Status: DC | PRN
Start: 2016-05-01 — End: 2016-05-07
  Filled 2016-05-01 (×3): qty 2

## 2016-05-01 MED ORDER — PIPERACILLIN-TAZOBACTAM 3.375 G IVPB 30 MIN
3.3750 g | Freq: Once | INTRAVENOUS | Status: AC
  Administered 2016-05-01: 3.375 g via INTRAVENOUS
  Filled 2016-05-01: qty 50

## 2016-05-01 MED ORDER — ONDANSETRON HCL 4 MG PO TABS
4.0000 mg | ORAL_TABLET | Freq: Three times a day (TID) | ORAL | Status: DC | PRN
  Administered 2016-05-01 – 2016-05-06 (×3): 4 mg via ORAL
  Filled 2016-05-01 (×3): qty 1

## 2016-05-01 MED ORDER — PANTOPRAZOLE SODIUM 40 MG PO TBEC
40.0000 mg | DELAYED_RELEASE_TABLET | Freq: Every day | ORAL | Status: DC
Start: 2016-05-01 — End: 2016-05-07
  Administered 2016-05-01 – 2016-05-07 (×7): 40 mg via ORAL
  Filled 2016-05-01 (×7): qty 1

## 2016-05-01 MED ORDER — PIPERACILLIN-TAZOBACTAM 3.375 G IVPB
3.3750 g | Freq: Three times a day (TID) | INTRAVENOUS | Status: DC
  Administered 2016-05-01 – 2016-05-03 (×7): 3.375 g via INTRAVENOUS
  Filled 2016-05-01 (×8): qty 50

## 2016-05-01 MED ORDER — MAGNESIUM SULFATE 2 GM/50ML IV SOLN
2.0000 g | Freq: Once | INTRAVENOUS | Status: AC
  Administered 2016-05-01: 2 g via INTRAVENOUS
  Filled 2016-05-01: qty 50

## 2016-05-01 MED ORDER — LEVALBUTEROL HCL 1.25 MG/0.5ML IN NEBU
1.2500 mg | INHALATION_SOLUTION | RESPIRATORY_TRACT | Status: DC
  Administered 2016-05-01 – 2016-05-04 (×21): 1.25 mg via RESPIRATORY_TRACT
  Filled 2016-05-01 (×26): qty 0.5

## 2016-05-01 MED ORDER — METHYLPREDNISOLONE SODIUM SUCC 125 MG IJ SOLR
60.0000 mg | Freq: Three times a day (TID) | INTRAMUSCULAR | Status: DC
  Administered 2016-05-01: 23:00:00 via INTRAVENOUS
  Administered 2016-05-01 – 2016-05-02 (×3): 60 mg via INTRAVENOUS
  Filled 2016-05-01 (×4): qty 2

## 2016-05-01 MED ORDER — ALBUTEROL (5 MG/ML) CONTINUOUS INHALATION SOLN
INHALATION_SOLUTION | RESPIRATORY_TRACT | Status: AC
  Administered 2016-05-01: 10 mg/h via RESPIRATORY_TRACT
  Filled 2016-05-01: qty 20

## 2016-05-01 MED ORDER — POLYETHYLENE GLYCOL 3350 17 G PO PACK: 17.0000 g | PACK | Freq: Every day | ORAL | Status: DC | PRN

## 2016-05-01 MED ORDER — GUAIFENESIN ER 600 MG PO TB12
600.0000 mg | ORAL_TABLET | Freq: Two times a day (BID) | ORAL | Status: DC | PRN
  Administered 2016-05-02 – 2016-05-07 (×7): 600 mg via ORAL
  Filled 2016-05-01 (×7): qty 1

## 2016-05-01 MED ORDER — CYCLOBENZAPRINE HCL 10 MG PO TABS: 10.0000 mg | ORAL_TABLET | Freq: Three times a day (TID) | ORAL | Status: DC | PRN

## 2016-05-01 MED ORDER — METHYLPREDNISOLONE SODIUM SUCC 125 MG IJ SOLR
125.0000 mg | Freq: Once | INTRAMUSCULAR | Status: AC
  Administered 2016-05-01: 125 mg via INTRAVENOUS
  Filled 2016-05-01: qty 2

## 2016-05-01 MED ORDER — HYDRALAZINE HCL 20 MG/ML IJ SOLN: 5.0000 mg | INTRAMUSCULAR | Status: DC | PRN

## 2016-05-01 NOTE — Progress Notes (Signed)
Patient seen and examined at bedside, patient admitted after midnight, please see earlier detailed admission note by Dr. Clyde Lundborg. Briefly, patient presented with acute on chronic respiratory failure secondary to COPD and possible pneumonia. He was initially requiring BiPAP and has been weaned to nasal canula. Level of care changed from Stepdown to Med-surg. Continue current antibiotics, steroids and nebulizer/inhaler regimen. Add flutter valve and incentive spirometer.  Jacquelin Hawking, MD [facility] 05/01/2016, 2:35 PM Pager: [phone]

## 2016-05-01 NOTE — ED Notes (Signed)
Attempted to call report to [address] [place]. Receiving nurse to call back for report when available.

## 2016-05-01 NOTE — Progress Notes (Signed)
Pharmacy Antibiotic Note  [name] is a 51 y.o. male with hx of CHF, COPD and sarcoidosis on 04/30/2016 with pneumonia.  Pharmacy has been consulted for vancomycin dosing.  Plan: Zosyn 3.375g IV q8h (4 hour infusion). (MD) Zmax 500 mg IV q24h  (MD) Vancomycin 1 Gm IV q12h  VT=15-20 mg/L Daily Scr  Height:  (170.2 cm) Weight: 134 lb (60.8 kg) IBW/kg (Calculated) : 66.1  Temp (24hrs), Avg:98.4 F (36.9 C), Min:98.4 F (36.9 C), Max:98.4 F (36.9 C)   Recent Labs Lab 05/01/16 0047 05/01/16 0112  WBC 13.5*  --   CREATININE  --  0.90    Estimated Creatinine Clearance: 83.5 mL/min (by C-G formula based on SCr of 0.9 mg/dL).    Allergies  Allergen Reactions  . Clindamycin/Lincomycin Anaphylaxis and Swelling    Made face and tongue swell  . Clindamycin/Lincomycin Swelling    Facial and tongue swelling    Antimicrobials this admission: 4/20 zosyn >>  4/20 vancomycin >>  4/21  zmax >>  Dose adjustments this admission:   Microbiology results:  BCx:   UCx:    Sputum:    MRSA PCR:   Thank you for allowing pharmacy to be a [name] patient's care.  Lorenza Evangelist 05/01/2016 2:56 AM

## 2016-05-01 NOTE — ED Notes (Signed)
Second amion message sent to Dr. Mal Misty regarding need for step-down bed.

## 2016-05-01 NOTE — H&P (Signed)
History and Physical    [name] WUJ:[id number] DOB: 1965-07-14 DOA: 04/30/2016  Referring MD/NP/PA:   PCP: Keturah Barre [facility]   Patient coming from:  The patient is coming from home.  At baseline, pt is independent for most of ADL.   Chief Complaint: Cough, shortness breath,   HPI: [name] is a 51 y.o. male with medical history significant of sarcoidosis, COPD, chronic respiratory failure on 4 L oxygen at home, hypertension, GERD, OSA on CPAP, cocaine abuse, who presents with cough and shortness of breath.   Patient was recently hospitalized from 4/6-4/9 due to acute on chronic respiratory failure secondary to possible CAP. He was discharged on 7 days of Levaquin. Patient states that since yesterday he started having worsening shortness breath and wheezing. He has dry cough, but no runny nose or sore throat. No fever or chills. Patient had mild chest pain in the left upper and right lower chest area, which has resolved. Currently no chest pain. Denies tenderness in calf areas. Patient is using accessory muscles for breathing, and can barely speak in full sentence. Patient does not have nausea, vomiting, diarrhea, abdominal pain, symptoms of UTI or unilateral weakness. No leg edema.  ED Course: pt was found to have WBC 13.5, negative troponin, electrolytes renal function okay, temperature normal, tachycardia, tachypnea, oxygen sat 83% on room air. Chest x-ray showed left basilar infiltration and large bulla in the right upper lobe. Patient is admitted to stepdown as inpatient. BiPAP was started in ED. Patient received 2 g of magnesium sulfate in ED.  Review of Systems:   General: no fevers, chills, no changes in body weight, has poor appetite, has fatigue HEENT: no blurry vision, hearing changes or sore throat Respiratory: has dyspnea, coughing, wheezing CV: had chest pain, no palpitations GI: no nausea, vomiting, abdominal pain, diarrhea, constipation GU: no dysuria,  burning on urination, increased urinary frequency, hematuria  Ext: no leg edema Neuro: no unilateral weakness, numbness, or tingling, no vision change or hearing loss Skin: no rash, no skin tear. MSK: No muscle spasm, no deformity, no limitation of range of movement in spin Heme: No easy bruising.  Travel history: No recent long distant travel.  Allergy:  Allergies  Allergen Reactions  . Clindamycin/Lincomycin Anaphylaxis and Swelling    Made face and tongue swell  . Clindamycin/Lincomycin Swelling    Facial and tongue swelling    Past Medical History:  Diagnosis Date  . Bronchitis   . CHF (congestive heart failure) (HCC)   . Chronic respiratory failure (HCC)    home O@ 4L (01/28/14)  . Chronic respiratory failure (HCC)   . COPD (chronic obstructive pulmonary disease) (HCC)   . Hypertension   . Neuromuscular disorder (HCC)   . OSA on CPAP   . Pneumonia   . Sarcoidosis     Past Surgical History:  Procedure Laterality Date  . arm surgery     . ROTATOR CUFF REPAIR    . VIDEO ASSISTED THORACOSCOPY (VATS)/THOROCOTOMY Left 07/17/2012   Procedure: VIDEO ASSISTED THORACOSCOPY (VATS)/BLEB Stapling;  Surgeon: Alleen Borne, MD;  Location: [facility] OR;  Service: Thoracic;  Laterality: Left;    Social History:  reports that he has never smoked. He has never used smokeless tobacco. He reports that he drinks alcohol. He reports that he does not use drugs.  Family History:  Family History  Problem Relation Age of Onset  . Diabetes Father      Prior to Admission medications   Medication Sig Start  Date End Date Taking? Authorizing Provider  acetaminophen (TYLENOL) 325 MG tablet Take 2 tablets (650 mg total) by mouth every 6 (six) hours as needed for mild pain (or Fever >/= 101). 04/19/16  Yes Alison Murray, MD  albuterol (PROVENTIL HFA;VENTOLIN HFA) 108 (90 Base) MCG/ACT inhaler Inhale 2 puffs into the lungs every 6 (six) hours as needed for wheezing or shortness of breath. 04/19/16  Yes Alison Murray, MD  bisacodyl (DULCOLAX) 5 MG EC tablet Take 5 mg by mouth daily as needed for moderate constipation.   Yes Historical Provider, MD  calcium-vitamin D (OSCAL WITH D) 500-200 MG-UNIT tablet Take 1 tablet by mouth daily.   Yes Historical Provider, MD  CARTIA XT 120 MG 24 hr capsule Take 120 mg by mouth every morning. 04/16/16  Yes Historical Provider, MD  cyclobenzaprine (FLEXERIL) 10 MG tablet Take 10 mg by mouth 3 (three) times daily as needed for muscle spasms.   Yes Historical Provider, MD  diphenhydrAMINE (BENADRYL) 25 MG tablet Take 25 mg by mouth every 6 (six) hours as needed for itching or allergies.   Yes Historical Provider, MD  Ergocalciferol (VITAMIN D2) 400 units TABS Take 400 Units by mouth daily. 03/18/08  Yes Historical Provider, MD  ferrous sulfate 325 (65 FE) MG tablet Take 325 mg by mouth 2 (two) times daily with a meal.   Yes Historical Provider, MD  guaiFENesin (MUCINEX) 600 MG 12 hr tablet Take 600 mg by mouth 2 (two) times daily as needed for cough or to loosen phlegm.   Yes Historical Provider, MD  ipratropium-albuterol (DUONEB) 0.5-2.5 (3) MG/3ML SOLN Take 3 mLs by nebulization every 4 (four) hours as needed. Patient taking differently: Take 3 mLs by nebulization every 4 (four) hours as needed (SOB, wheezing).  04/19/16  Yes Alison Murray, MD  Multiple Vitamins-Minerals (MULTIVITAMIN WITH MINERALS) tablet Take 1 tablet by mouth every morning.    Yes Historical Provider, MD  omeprazole (PRILOSEC) 40 MG capsule Take 40 mg by mouth 2 (two) times daily.   Yes Historical Provider, MD  ondansetron (ZOFRAN) 4 MG tablet Take 4 mg by mouth every 8 (eight) hours as needed for nausea or vomiting.   Yes Historical Provider, MD  OXYGEN Inhale 4 L into the lungs continuous.   Yes Historical Provider, MD  polyethylene glycol (MIRALAX / GLYCOLAX) packet Take 17 g by mouth daily as needed (constipation).   Yes Historical Provider, MD  saline (AYR) GEL Place 1 application into the nose every 4  (four) hours as needed (dryness/irritation).   Yes Historical Provider, MD  SYMBICORT 160-4.5 MCG/ACT inhaler Inhale 1 puff into the lungs 2 (two) times daily. 04/19/16  Yes Alison Murray, MD  diltiazem (CARDIZEM CD) 180 MG 24 hr capsule Take 1 capsule (180 mg total) by mouth at bedtime. Patient not taking: Reported on 05/01/2016 04/19/16   Alison Murray, MD  levofloxacin (LEVAQUIN) 500 MG tablet Take 1 tablet (500 mg total) by mouth daily. Patient not taking: Reported on 05/01/2016 04/19/16   Alison Murray, MD  predniSONE (DELTASONE) 50 MG tablet Take 1 tablet (50 mg total) by mouth daily with breakfast. Patient not taking: Reported on 05/01/2016 04/19/16   Alison Murray, MD    Physical Exam: Vitals:   05/01/16 0201 05/01/16 0230 05/01/16 0300 05/01/16 0302  BP: (!) 141/119 (!) 131/102 (!) 157/124 (!) 157/124  Pulse: (!) 120 (!) 111 (!) 110 (!) 106  Resp: (!) Temp:  TempSrc:      SpO2: 100% 100% 96% 100%  Weight:      Height:       General: in acute distress HEENT:       Eyes: PERRL, EOMI, no scleral icterus.       ENT: No discharge from the ears and nose, no pharynx injection, no tonsillar enlargement.        Neck: No JVD, no bruit, no mass felt. Heme: No neck lymph node enlargement. Cardiac: S1/S2, RRR, No murmurs, No gallops or rubs. Respiratory: decreased air movement bilaterally. Has wheezing bilaterally. GI: Soft, nondistended, nontender, no rebound pain, no organomegaly, BS present. GU: No hematuria Ext: No pitting leg edema bilaterally. 2+DP/PT pulse bilaterally. Musculoskeletal: No joint deformities, No joint redness or warmth, no limitation of ROM in spin. Skin: No rashes.  Neuro: Alert, oriented X3, cranial nerves II-XII grossly intact, moves all extremities normally.  Psych: Patient is not psychotic, no suicidal or hemocidal ideation.  Labs on Admission: I have personally reviewed following labs and imaging studies  CBC:  Recent Labs Lab 05/01/16 0047  05/01/16 0112  WBC 13.5*  --   NEUTROABS 11.4*  --   HGB 12.4* 12.9*  HCT 38.5* 38.0*  MCV 85.4  --   PLT 254  --    Basic Metabolic Panel:  Recent Labs Lab 05/01/16 0112  NA 136  K 4.1  CL 100*  GLUCOSE 142*  BUN 11  CREATININE 0.90   GFR: Estimated Creatinine Clearance: 83.5 mL/min (by C-G formula based on SCr of 0.9 mg/dL). Liver Function Tests: No results for input(s): AST, ALT, ALKPHOS, BILITOT, PROT, ALBUMIN in the last 168 hours. No results for input(s): LIPASE, AMYLASE in the last 168 hours. No results for input(s): AMMONIA in the last 168 hours. Coagulation Profile: No results for input(s): INR, PROTIME in the last 168 hours. Cardiac Enzymes: No results for input(s): CKTOTAL, CKMB, CKMBINDEX, TROPONINI in the last 168 hours. BNP (last 3 results) No results for input(s): PROBNP in the last 8760 hours. HbA1C: No results for input(s): HGBA1C in the last 72 hours. CBG: No results for input(s): GLUCAP in the last 168 hours. Lipid Profile: No results for input(s): CHOL, HDL, LDLCALC, TRIG, CHOLHDL, LDLDIRECT in the last 72 hours. Thyroid Function Tests: No results for input(s): TSH, T4TOTAL, FREET4, T3FREE, THYROIDAB in the last 72 hours. Anemia Panel: No results for input(s): VITAMINB12, FOLATE, FERRITIN, TIBC, IRON, RETICCTPCT in the last 72 hours. Urine analysis:    Component Value Date/Time   COLORURINE YELLOW 01/09/2014 1843   APPEARANCEUR CLEAR 01/09/2014 1843   LABSPEC 1.023 01/09/2014 1843   PHURINE 5.5 01/09/2014 1843   GLUCOSEU 250 (A) 01/09/2014 1843   HGBUR NEGATIVE 01/09/2014 1843   BILIRUBINUR NEGATIVE 01/09/2014 1843   KETONESUR NEGATIVE 01/09/2014 1843   PROTEINUR NEGATIVE 01/09/2014 1843   UROBILINOGEN 0.2 01/09/2014 1843   NITRITE NEGATIVE 01/09/2014 1843   LEUKOCYTESUR NEGATIVE 01/09/2014 1843   Sepsis Labs: (procalcitonin:4,lacticidven:4) )No results found for this or any previous visit (from the past 240 hour(s)).    Radiological Exams on Admission: Dg Chest 2 View  Result Date: 05/01/2016 CLINICAL DATA:  Acute onset of shortness of breath and left-sided to central chest pain. Initial encounter. EXAM: CHEST  2 VIEW COMPARISON:  Chest radiograph performed 04/17/2016 FINDINGS: The lungs are well-aerated. Bilateral changes of sarcoidosis are again noted, with a large underlying right upper lobe bulla. Superimposed left basilar pneumonia is a concern. Small bilateral pleural effusions are suspected. No pneumothorax  is seen. The heart is normal in size; the mediastinal contour is within normal limits. No acute osseous abnormalities are seen. IMPRESSION: Changes of sarcoidosis again noted, with large underlying right upper lobe bulla. Superimposed left basilar pneumonia is a concern. Suspect small bilateral pleural effusions. Electronically Signed   By: Roanna Raider M.D.   On: 05/01/2016 00:07     EKG: Independently reviewed.  Sinus tachycardia, QTC 455, right axis deviation, bilateral atrial enlargement.   Assessment/Plan Principal Problem:   Acute on chronic respiratory failure with hypoxia (HCC) Active Problems:   OSA on CPAP   GERD (gastroesophageal reflux disease)   Essential hypertension   HCAP (healthcare-associated pneumonia)   COPD exacerbation (HCC)   Sarcoidosis   Cocaine abuse   Acute on chronic respiratory failure with hypoxia due to COPD exacerbation and possible HCAP in the setting of sarcoidosis. CXR showed possible infiltrates in the left base.   - will admit to SDU as inpt - continue BiPAP - check stat ABG. - IV Vancomycin, Zosyn and azithromycin - Mucinex for cough  - scheduled Atrovent and Xopenex Neb prn for SOB - Solu-Medrol 60 mg 3 times a day - Urine legionella and S. pneumococcal antigen - Follow up blood culture x2, sputum culture,, plus Flu pcr  OSA on CPAP at home: -now on BiPAP  Essential hypertension: -Continue cardizme -IV hydralazine when necessary  Hx of  Cocaine abuse: -did counseling about importance of quitting smoking -Check UDS  GERD: -Protonix   DVT ppx: SQ Lovenox Code Status: Full code Family Communication: None at bed side.     Disposition Plan:  Anticipate discharge back to previous home environment Consults called:  None Admission status:  SDU/inpation       Date of Service 05/01/2016    Lorretta Harp [facility] Pager [phone]  If 7PM-7AM, please contact night-coverage [email or web address] Password [email or web address] 05/01/2016, 3:07 AM

## 2016-05-01 NOTE — ED Notes (Signed)
Respiratory aware of blood gas

## 2016-05-02 LAB — STREP PNEUMONIAE URINARY ANTIGEN: Strep Pneumo Urinary Antigen: NEGATIVE

## 2016-05-02 LAB — CREATININE, SERUM
CREATININE: 0.84 mg/dL (ref 0.61–1.24)
GFR calc Af Amer: >60 mL/min (ref >60)
GFR calc non Af Amer: >60 mL/min (ref >60)

## 2016-05-02 LAB — VANCOMYCIN, TROUGH: Vancomycin Tr: 12 ug/mL — ABNORMAL LOW (ref 15–20)

## 2016-05-02 MED ORDER — METHYLPREDNISOLONE SODIUM SUCC 125 MG IJ SOLR
60.0000 mg | Freq: Two times a day (BID) | INTRAMUSCULAR | Status: DC
  Administered 2016-05-02 – 2016-05-04 (×4): 60 mg via INTRAVENOUS
  Filled 2016-05-02 (×4): qty 2

## 2016-05-02 MED ORDER — PREDNISONE 20 MG PO TABS: 40.0000 mg | ORAL_TABLET | Freq: Every day | ORAL | Status: DC

## 2016-05-02 MED ORDER — SODIUM CHLORIDE 0.9 % IV SOLN
1250.0000 mg | Freq: Two times a day (BID) | INTRAVENOUS | Status: DC
  Administered 2016-05-03: 1250 mg via INTRAVENOUS
  Filled 2016-05-02: qty 1000

## 2016-05-02 NOTE — Progress Notes (Signed)
Pt prefers self placement of CPAP.  Pt to notify RT if any assistance is required throughout the night.  RT to monitor and assess as needed.

## 2016-05-02 NOTE — Progress Notes (Addendum)
Pharmacy Antibiotic Note  [name] is a 51 y.o. male with hx of CHF, COPD on oxygen at home and sarcoidosis on 04/30/2016 with pneumonia.  Recently hospitalized from 4/6-4/9 due to acute on chronic respiratory failure secondary to possible CAP. He was discharged on 7 days of Levaquin.  Pharmacy has been consulted for vancomycin dosing.  Zosyn and Zithromax per MD.  Plan: On Vancomycin 1 Gm IV q12h  VT=15-20 mg/L Check VT tonight Daily Scr  Height:  (170.2 cm) Weight: 134 lb (60.8 kg) IBW/kg (Calculated) : 66.1  Temp (24hrs), Avg:98.2 F (36.8 C), Min:97.6 F (36.4 C), Max:98.7 F (37.1 C)   Recent Labs Lab 05/01/16 0047 05/01/16 0112 05/01/16 0330 05/01/16 0331 05/01/16 0654 05/02/16 0553  WBC 13.5*  --   --   --   --   --   CREATININE  --  0.90  --  0.89  --  0.84  LATICACIDVEN  --   --  0.9  --  0.9  --     Estimated Creatinine Clearance: 89.5 mL/min (by C-G formula based on SCr of 0.84 mg/dL).    Allergies  Allergen Reactions  . Clindamycin/Lincomycin Anaphylaxis and Swelling    Made face and tongue swell  . Clindamycin/Lincomycin Swelling    Facial and tongue swelling    Antimicrobials this admission: 4/21 zosyn >>  4/21 vancomycin >>  4/21 zmax >>  Dose adjustments this admission:  Microbiology results: 4/21 BCx: ngtd 4/21 influenza neg 4/21 strep/leg ur ag: neg/IP  Thank you for allowing pharmacy to be a part of this patient's care.  Clance Boll 05/02/2016 2:00 PM

## 2016-05-02 NOTE — Progress Notes (Signed)
PROGRESS NOTE    [name]  ZOX:[id number] DOB: Nov 14, 1965 DOA: 04/30/2016 PCP: Keturah Barre [facility]   Brief Narrative: [name] is a 51 y.o. male with medical history significant of sarcoidosis, COPD, chronic respiratory failure on 4 L oxygen at home, hypertension, GERD, OSA on CPAP, cocaine abuse. He is here with acute respiratory failure secondary to COPD vs sarcoidosis flare, improving on steroids and nebulizer treatments.   Assessment & Plan:   Principal Problem:   Acute on chronic respiratory failure with hypoxia (HCC) Active Problems:   OSA on CPAP   GERD (gastroesophageal reflux disease)   Essential hypertension   HCAP (healthcare-associated pneumonia)   COPD exacerbation (HCC)   Sarcoidosis   Cocaine abuse   Acute on chronic respiratory failure Secondary to COPD vs Sarcoidosis flare. Back to baseline oxygen.  COPD vs sarcoidosis exacerbation Not at baseline -continue nebulizer treatments -continue solu-medrol -incentive spirometer  ?HCAP Doubt this. Likely all related to chronic lung disease. Cultures pending.  OSA on CPAP -continue CPAP  Essential hypertension -continue Cardizem  History of cocaine abuse Cocaine positive.  GERD -continue Protonix    DVT prophylaxis: Lovenox Code Status: Full code Family Communication: None at bedside Disposition Plan: Discharge pending medical improvement   Consultants:   None  Procedures:   Bipap  Antimicrobials:  Vancomycin  Zosyn  Azithromycin    Subjective: Patient feels back at baseline at rest. Has not gotten up to walk.  Objective: Vitals:   05/01/16 1445 05/01/16 2000 05/01/16 2041 05/02/16 0618  BP: (!) 131/92  (!) 153/97 129/90  Pulse: (!) 104  (!) 108 (!) 103  Resp: 20  20 (!) 21  Temp: 98.4 F (36.9 C)  98.7 F (37.1 C) 97.6 F (36.4 C)  TempSrc: Oral  Oral Axillary  SpO2: 98% 99% 98% 100%  Weight:      Height:  (1.702 m)       Intake/Output Summary  (Last 24 hours) at 05/02/16 1420 Last data filed at 05/02/16 1300  Gross per 24 hour  Intake              720 ml  Output             1750 ml  Net            -1030 ml   Filed Weights   05/01/16 0050  Weight: 60.8 kg (134 lb)    Examination:  General exam: Appears calm and comfortable Respiratory system: Diffuse wheezing and diminished breath sounds. Respiratory effort normal. Cardiovascular system: S1 & S2 heard, RRR. No murmurs. Gastrointestinal system: Abdomen is nondistended, soft and nontender. Normal bowel sounds heard. Central nervous system: Alert and oriented. No focal neurological deficits. Extremities: No edema. No calf tenderness Skin: No cyanosis. No rashes Psychiatry: Judgement and insight appear normal. Mood & affect appropriate.     Data Reviewed: I have personally reviewed following labs and imaging studies  CBC:  Recent Labs Lab 05/01/16 0047 05/01/16 0112  WBC 13.5*  --   NEUTROABS 11.4*  --   HGB 12.4* 12.9*  HCT 38.5* 38.0*  MCV 85.4  --   PLT 254  --    Basic Metabolic Panel:  Recent Labs Lab 05/01/16 0112 05/01/16 0331 05/02/16 0553  NA 136  --   --   K 4.1  --   --   CL 100*  --   --   GLUCOSE 142*  --   --   BUN 11  --   --  CREATININE 0.90 0.89 0.84   GFR: Estimated Creatinine Clearance: 89.5 mL/min (by C-G formula based on SCr of 0.84 mg/dL). Liver Function Tests: No results for input(s): AST, ALT, ALKPHOS, BILITOT, PROT, ALBUMIN in the last 168 hours. No results for input(s): LIPASE, AMYLASE in the last 168 hours. No results for input(s): AMMONIA in the last 168 hours. Coagulation Profile: No results for input(s): INR, PROTIME in the last 168 hours. Cardiac Enzymes: No results for input(s): CKTOTAL, CKMB, CKMBINDEX, TROPONINI in the last 168 hours. BNP (last 3 results) No results for input(s): PROBNP in the last 8760 hours. HbA1C: No results for input(s): HGBA1C in the last 72 hours. CBG: No results for input(s): GLUCAP  in the last 168 hours. Lipid Profile: No results for input(s): CHOL, HDL, LDLCALC, TRIG, CHOLHDL, LDLDIRECT in the last 72 hours. Thyroid Function Tests: No results for input(s): TSH, T4TOTAL, FREET4, T3FREE, THYROIDAB in the last 72 hours. Anemia Panel: No results for input(s): VITAMINB12, FOLATE, FERRITIN, TIBC, IRON, RETICCTPCT in the last 72 hours. Sepsis Labs:  Recent Labs Lab 05/01/16 0330 05/01/16 0654  LATICACIDVEN 0.9 0.9    Recent Results (from the past 240 hour(s))  Blood culture (routine x 2)     Status: None (Preliminary result)   Collection Time: 05/01/16 12:48 AM  Result Value Ref Range Status   Specimen Description BLOOD LEFT HAND  Final   Special Requests   Final    BOTTLES DRAWN AEROBIC AND ANAEROBIC Blood Culture adequate volume   Culture   Final    NO GROWTH 1 DAY Performed at [facility], [address] [place], [place] [address]    Report Status PENDING  Incomplete  Blood culture (routine x 2)     Status: None (Preliminary result)   Collection Time: 05/01/16 12:48 AM  Result Value Ref Range Status   Specimen Description BLOOD BLOOD LEFT FOREARM  Final   Special Requests   Final    BOTTLES DRAWN AEROBIC AND ANAEROBIC Blood Culture adequate volume   Culture   Final    NO GROWTH 1 DAY Performed at [facility], [address] [place], [place] [address]    Report Status PENDING  Incomplete         Radiology Studies: Dg Chest 2 View  Result Date: 05/01/2016 CLINICAL DATA:  Acute onset of shortness of breath and left-sided to central chest pain. Initial encounter. EXAM: CHEST  2 VIEW COMPARISON:  Chest radiograph performed 04/17/2016 FINDINGS: The lungs are well-aerated. Bilateral changes of sarcoidosis are again noted, with a large underlying right upper lobe bulla. Superimposed left basilar pneumonia is a concern. Small bilateral pleural effusions are suspected. No pneumothorax is seen. The heart is normal in size; the mediastinal  contour is within normal limits. No acute osseous abnormalities are seen. IMPRESSION: Changes of sarcoidosis again noted, with large underlying right upper lobe bulla. Superimposed left basilar pneumonia is a concern. Suspect small bilateral pleural effusions. Electronically Signed   By: Roanna Raider M.D.   On: 05/01/2016 00:07        Scheduled Meds: . calcium-vitamin D  1 tablet Oral Daily  . cholecalciferol  400 Units Oral Daily  . diltiazem  120 mg Oral Daily  . enoxaparin (LOVENOX) injection  40 mg Subcutaneous Q24H  . ferrous sulfate  325 mg Oral BID WC  . ipratropium  0.5 mg Nebulization Q4H  . levalbuterol  1.25 mg Nebulization Q4H  . multivitamin with minerals  1 tablet Oral q  morning - 10a  . pantoprazole  40 mg Oral Daily  . [START ON 05/03/2016] predniSONE  40 mg Oral Q breakfast   Continuous Infusions: . azithromycin Stopped (05/02/16 0618)  . piperacillin-tazobactam (ZOSYN)  IV 3.375 g (05/02/16 0800)  . vancomycin 1,000 mg (05/02/16 1103)     LOS: 1 day     Jacquelin Hawking, MD [facility] 05/02/2016, 2:20 PM Pager: [phone]  If 7PM-7AM, please contact night-coverage [email or web address] Password [email or web address] 05/02/2016, 2:20 PM

## 2016-05-02 NOTE — Progress Notes (Signed)
Pharmacy Antibiotic Note  [name] is a 51 y.o. male with hx of CHF, COPD on oxygen at home and sarcoidosis on 04/30/2016 with pneumonia.  Recently hospitalized from 4/6-4/9 due to acute on chronic respiratory failure secondary to possible CAP. He was discharged on 7 days of Levaquin.  Pharmacy has been consulted for vancomycin dosing.  Zosyn and Zithromax per MD.  Plan: VT=12 mg/L at Css on 1Gm IV q12h Below goal of 15-20 mg/L Increase Vancomycin to 1250 mg IV q12h Daily Scr  Height:  (170.2 cm) Weight: 134 lb (60.8 kg) IBW/kg (Calculated) : 66.1  Temp (24hrs), Avg:97.8 F (36.6 C), Min:97.5 F (36.4 C), Max:98.3 F (36.8 C)   Recent Labs Lab 05/01/16 0047 05/01/16 0112 05/01/16 0330 05/01/16 0331 05/01/16 0654 05/02/16 0553 05/02/16 2201  WBC 13.5*  --   --   --   --   --   --   CREATININE  --  0.90  --  0.89  --  0.84  --   LATICACIDVEN  --   --  0.9  --  0.9  --   --   VANCOTROUGH  --   --   --   --   --   --  12*    Estimated Creatinine Clearance: 89.5 mL/min (by C-G formula based on SCr of 0.84 mg/dL).    Allergies  Allergen Reactions  . Clindamycin/Lincomycin Anaphylaxis and Swelling    Made face and tongue swell  . Clindamycin/Lincomycin Swelling    Facial and tongue swelling    Antimicrobials this admission: 4/21 zosyn >>  4/21 vancomycin >>  4/21 zmax >>  Dose adjustments this admission:  Microbiology results: 4/21 BCx: ngtd 4/21 influenza neg 4/21 strep/leg ur ag: neg/IP  Thank you for allowing pharmacy to be a part of this patient's care.  Lorenza Evangelist 05/02/2016 11:13 PM

## 2016-05-02 NOTE — Progress Notes (Signed)
Pt ambulating in hallway with nurse and NT, on 4L O2 via [place]. At about 150 feet, pt's HR 143 with sats dropping from 97 to 89. Pt short of breath and using accessory muscles to breath. Pt sat in rolling chair and taken back to room. Pt remains on 4L O2 via [place] with sats 99, HR 100. MD notified.

## 2016-05-03 DIAGNOSIS — F1410 Cocaine abuse, uncomplicated: Secondary | ICD-10-CM

## 2016-05-03 LAB — CREATININE, SERUM: Creatinine, Ser: 0.82 mg/dL (ref 0.61–1.24)

## 2016-05-03 LAB — LEGIONELLA PNEUMOPHILA SEROGP 1 UR AG: L. PNEUMOPHILA SEROGP 1 UR AG: NEGATIVE

## 2016-05-03 LAB — RAPID URINE DRUG SCREEN, HOSP PERFORMED
AMPHETAMINES: NOT DETECTED
BARBITURATES: NOT DETECTED
Benzodiazepines: NOT DETECTED
COCAINE: POSITIVE — AB
OPIATES: NOT DETECTED
Tetrahydrocannabinol: NOT DETECTED

## 2016-05-03 MED ORDER — HYDRALAZINE HCL 20 MG/ML IJ SOLN
5.0000 mg | Freq: Once | INTRAMUSCULAR | Status: AC
  Administered 2016-05-03: 5 mg via INTRAVENOUS
  Filled 2016-05-03: qty 1

## 2016-05-03 MED ORDER — DIPHENHYDRAMINE HCL 50 MG/ML IJ SOLN
25.0000 mg | Freq: Once | INTRAMUSCULAR | Status: DC
  Filled 2016-05-03: qty 1

## 2016-05-03 MED ORDER — METOCLOPRAMIDE HCL 5 MG/ML IJ SOLN
10.0000 mg | Freq: Once | INTRAMUSCULAR | Status: DC
  Filled 2016-05-03: qty 2

## 2016-05-03 MED ORDER — KETOROLAC TROMETHAMINE 30 MG/ML IJ SOLN
30.0000 mg | Freq: Once | INTRAMUSCULAR | Status: DC
  Filled 2016-05-03: qty 1

## 2016-05-03 MED ORDER — AZITHROMYCIN 250 MG PO TABS
250.0000 mg | ORAL_TABLET | Freq: Every day | ORAL | Status: DC
  Administered 2016-05-03 – 2016-05-07 (×5): 250 mg via ORAL
  Filled 2016-05-03 (×5): qty 1

## 2016-05-03 MED ORDER — HYDRALAZINE HCL 20 MG/ML IJ SOLN: 5.0000 mg | INTRAMUSCULAR | Status: DC | PRN

## 2016-05-03 NOTE — Progress Notes (Signed)
PROGRESS NOTE    [name]  GNF:[id number] DOB: 30-Mar-1965 DOA: 04/30/2016 PCP: Keturah Barre [facility]   Brief Narrative: [name] is a 51 y.o. male with medical history significant of sarcoidosis, COPD, chronic respiratory failure on 4 L oxygen at home, hypertension, GERD, OSA on CPAP, cocaine abuse. He is here with acute respiratory failure secondary to COPD vs sarcoidosis flare, improving on steroids and nebulizer treatments.   Assessment & Plan:   Principal Problem:   Acute on chronic respiratory failure with hypoxia (HCC) Active Problems:   OSA on CPAP   GERD (gastroesophageal reflux disease)   Essential hypertension   HCAP (healthcare-associated pneumonia)   COPD exacerbation (HCC)   Sarcoidosis   Cocaine abuse   Acute on chronic respiratory failure Secondary to COPD vs Sarcoidosis flare. Back to baseline oxygen.  COPD vs sarcoidosis exacerbation Not at baseline -continue nebulizer treatments -continue solu-medrol -incentive spirometer -flutter valve -azithromycin  ?HCAP -discontinue antibiotics -blood culture pending -sputum culture/gram stain obtained but none sent  OSA on CPAP -continue CPAP  Essential hypertension -continue Cardizem  History of cocaine abuse Cocaine positive  GERD -continue Protonix    DVT prophylaxis: Lovenox Code Status: Full code Family Communication: None at bedside Disposition Plan: Discharge pending medical improvement   Consultants:   None  Procedures:   Bipap  Antimicrobials:  Vancomycin  Zosyn  Azithromycin    Subjective: Patient feels about the same as yesterday. Cough is non-productive.  Objective: Vitals:   05/02/16 2248 05/03/16 0026 05/03/16 0546 05/03/16 0736  BP: (!) 142/101  (!) 131/107   Pulse: (!) 117  (!) 112   Resp: (!) 22  20   Temp: 98.3 F (36.8 C)  97.7 F (36.5 C)   TempSrc: Oral  Oral   SpO2: 100% 97% 100% 99%  Weight:      Height:        Intake/Output  Summary (Last 24 hours) at 05/03/16 1145 Last data filed at 05/03/16 0907  Gross per 24 hour  Intake              720 ml  Output              300 ml  Net              420 ml   Filed Weights   05/01/16 0050  Weight: 60.8 kg (134 lb)    Examination:  General exam: Appears calm and comfortable Respiratory system: diminished breath sounds, no wheezing, normal respiratory effort. Cardiovascular system: S1 & S2 heard, RRR. No murmurs. Gastrointestinal system: Abdomen is nondistended, soft and nontender. Normal bowel sounds heard. Central nervous system: Alert and oriented. No focal neurological deficits. Extremities: No edema. No calf tenderness Skin: No cyanosis. No rashes Psychiatry: Judgement and insight appear normal. Mood & affect appropriate.     Data Reviewed: I have personally reviewed following labs and imaging studies  CBC:  Recent Labs Lab 05/01/16 0047 05/01/16 0112  WBC 13.5*  --   NEUTROABS 11.4*  --   HGB 12.4* 12.9*  HCT 38.5* 38.0*  MCV 85.4  --   PLT 254  --    Basic Metabolic Panel:  Recent Labs Lab 05/01/16 0112 05/01/16 0331 05/02/16 0553 05/03/16 0607  NA 136  --   --   --   K 4.1  --   --   --   CL 100*  --   --   --   GLUCOSE 142*  --   --   --  BUN 11  --   --   --   CREATININE 0.90 0.89 0.84 0.82   GFR: Estimated Creatinine Clearance: 91.7 mL/min (by C-G formula based on SCr of 0.82 mg/dL).  Sepsis Labs:  Recent Labs Lab 05/01/16 0330 05/01/16 0654  LATICACIDVEN 0.9 0.9    Recent Results (from the past 240 hour(s))  Blood culture (routine x 2)     Status: None (Preliminary result)   Collection Time: 05/01/16 12:48 AM  Result Value Ref Range Status   Specimen Description BLOOD LEFT HAND  Final   Special Requests   Final    BOTTLES DRAWN AEROBIC AND ANAEROBIC Blood Culture adequate volume   Culture   Final    NO GROWTH 1 DAY Performed at [facility], [address], [place], [place] [address]    Report Status  PENDING  Incomplete  Blood culture (routine x 2)     Status: None (Preliminary result)   Collection Time: 05/01/16 12:48 AM  Result Value Ref Range Status   Specimen Description BLOOD BLOOD LEFT FOREARM  Final   Special Requests   Final    BOTTLES DRAWN AEROBIC AND ANAEROBIC Blood Culture adequate volume   Culture   Final    NO GROWTH 1 DAY Performed at [facility], [address], [place], [place] [address]    Report Status PENDING  Incomplete         Radiology Studies: No results found.      Scheduled Meds: . calcium-vitamin D  1 tablet Oral Daily  . cholecalciferol  400 Units Oral Daily  . diltiazem  120 mg Oral Daily  . diphenhydrAMINE  25 mg Intravenous Once  . enoxaparin (LOVENOX) injection  40 mg Subcutaneous Q24H  . ferrous sulfate  325 mg Oral BID WC  . ipratropium  0.5 mg Nebulization Q4H  . ketorolac  30 mg Intravenous Once  . levalbuterol  1.25 mg Nebulization Q4H  . methylPREDNISolone (SOLU-MEDROL) injection  60 mg Intravenous Q12H  . metoCLOPramide (REGLAN) injection  10 mg Intravenous Once  . multivitamin with minerals  1 tablet Oral q morning - 10a  . pantoprazole  40 mg Oral Daily   Continuous Infusions: . piperacillin-tazobactam (ZOSYN)  IV 3.375 g (05/03/16 0825)  . vancomycin Stopped (05/03/16 0955)     LOS: 2 days     Jacquelin Hawking, MD [facility] 05/03/2016, 11:45 AM Pager: [phone]  If 7PM-7AM, please contact night-coverage [email or web address] Password [email or web address] 05/03/2016, 11:45 AM

## 2016-05-03 NOTE — Care Management Note (Signed)
Case Management Note  Patient Details  Name: [name] MRN: [id number] Date of Birth: April 04, 1965  Subjective/Objective:       Copd flare             Action/Plan: Date:  May 03, 2016 Chart reviewed for concurrent status and case management needs. Will continue to follow patient progress. Discharge Planning: following for needs Expected discharge date: [id number] Marcelle Smiling, BSN, [place], [place]   [phone]  Expected Discharge Date:  05/03/16               Expected Discharge Plan:  Home/Self Care  In-House Referral:     Discharge planning Services     Post Acute Care Choice:    Choice offered to:     DME Arranged:    DME Agency:     HH Arranged:    HH Agency:     Status of Service:  In process, will continue to follow  If discussed at Long Length of Stay Meetings, dates discussed:    Additional Comments:  Golda Acre, RN 05/03/2016, 10:37 AM

## 2016-05-03 NOTE — Evaluation (Signed)
Physical Therapy Evaluation Patient Details Name: [name] MRN: [id number] DOB: 10/21/1965 Today's Date: 05/03/2016   History of Present Illness  [name] is a 51 y.o. male with medical history significant of sarcoidosis, COPD, chronic respiratory failure on 4 L oxygen at home, hypertension, GERD, OSA on CPAP, cocaine abuse. He is here with acute respiratory failure secondary to COPD vs sarcoidosis flare  Clinical Impression  Pt admitted with above diagnosis. Pt currently with functional limitations due to the deficits listed below (see PT Problem List). * Pt will benefit from skilled PT to increase their independence and safety with mobility to allow discharge to the venue listed below.  Will continue to follow, see gait section below for O2 sats     Follow Up Recommendations No PT follow up    Equipment Recommendations  None recommended by PT    Recommendations for Other Services       Precautions / Restrictions Precautions Precautions: Other (comment) Precaution Comments: monitor sats      Mobility  Bed Mobility Overal bed mobility: Modified Independent                Transfers Overall transfer level: Modified independent                  Ambulation/Gait Ambulation/Gait assistance: Supervision;Min guard Ambulation Distance (Feet): 130 Feet Assistive device: None Gait Pattern/deviations: Step-through pattern     General Gait Details: pt with 3-4/4 DOE, O2 sats maintained >92% until last 20' and sats dropped to 85/84% on 4L O2; pt felt he could keep going, encouraged pt to rest and discussed energy conservation   Stairs            Wheelchair Mobility    Modified Rankin (Stroke Patients Only)       Balance                                             Pertinent Vitals/Pain Pain Assessment: 0-10 Pain Score: 1  Pain Location: R side with breathing Pain Descriptors / Indicators: Aching Pain Intervention(s): Limited  activity within patient's tolerance;Monitored during session    Home Living Family/patient expects to be discharged to:: Private residence Living Arrangements: Alone   Type of Home: House Home Access: Stairs to enter   Secretary/administrator of Steps: 4 Home Layout: Two level Home Equipment: Cane - single point      Prior Function Level of Independence: Independent               Hand Dominance        Extremity/Trunk Assessment   Upper Extremity Assessment Upper Extremity Assessment: Overall WFL for tasks assessed    Lower Extremity Assessment Lower Extremity Assessment: Overall WFL for tasks assessed       Communication   Communication: No difficulties  Cognition Arousal/Alertness: Awake/alert Behavior During Therapy: WFL for tasks assessed/performed Overall Cognitive Status: Within Functional Limits for tasks assessed                                        General Comments      Exercises Other Exercises Other Exercises: instructed in proper posture and correct breathing techniques/discussed over use of accesory muscles and incr WOB   Assessment/Plan    PT Assessment Patient needs continued  PT services  PT Problem List Decreased activity tolerance;Cardiopulmonary status limiting activity       PT Treatment Interventions Gait training;Therapeutic exercise;Patient/family education    PT Goals (Current goals can be found in the Care Plan section)  Acute Rehab PT Goals Patient Stated Goal: be able to breathe PT Goal Formulation: With patient Time For Goal Achievement: 05/10/16 Potential to Achieve Goals: Good Additional Goals Additional Goal #1: pt will be able to return demo correct breathing technqiues with min instructional cues    Frequency Min 2X/week   Barriers to discharge        Co-evaluation               End of Session   Activity Tolerance: Patient tolerated treatment well;Treatment limited secondary to  medical complications (Comment) (decr sats and incr WOB) Patient left: in chair;with call bell/phone within reach   PT Visit Diagnosis: Difficulty in walking, not elsewhere classified (R26.2)    Time: 4782-9562 PT Time Calculation (min) (ACUTE ONLY): 25 min   Charges:   PT Evaluation $PT Eval Low Complexity: 1 Procedure PT Treatments $Gait Training: 8-22 mins   PT G CodesDrucilla Chalet, PT Pager: [phone] 05/03/2016   [facility] 05/03/2016, 3:29 PM

## 2016-05-04 ENCOUNTER — Inpatient Hospital Stay (HOSPITAL_COMMUNITY): Payer: Commercial Managed Care - HMO

## 2016-05-04 LAB — CREATININE, SERUM
Creatinine, Ser: 0.79 mg/dL (ref 0.61–1.24)
GFR calc non Af Amer: >60 mL/min (ref >60)

## 2016-05-04 MED ORDER — LEVALBUTEROL HCL 1.25 MG/0.5ML IN NEBU
1.2500 mg | INHALATION_SOLUTION | Freq: Four times a day (QID) | RESPIRATORY_TRACT | Status: DC | PRN
  Administered 2016-05-04 – 2016-05-05 (×3): 1.25 mg via RESPIRATORY_TRACT
  Filled 2016-05-04 (×3): qty 0.5

## 2016-05-04 MED ORDER — IPRATROPIUM BROMIDE 0.02 % IN SOLN
0.5000 mg | Freq: Four times a day (QID) | RESPIRATORY_TRACT | Status: DC | PRN
  Administered 2016-05-04 – 2016-05-05 (×2): 0.5 mg via RESPIRATORY_TRACT
  Filled 2016-05-04 (×2): qty 2.5

## 2016-05-04 MED ORDER — METHYLPREDNISOLONE SODIUM SUCC 125 MG IJ SOLR
60.0000 mg | Freq: Every day | INTRAMUSCULAR | Status: DC
Start: 2016-05-05 — End: 2016-05-05

## 2016-05-04 NOTE — Clinical Social Work Note (Signed)
Clinical Social Work Assessment  Patient Details  Name: [name] MRN: [id number] Date of Birth: 1965/01/24  Date of referral:  05/04/16               Reason for consult:  Facility Placement                Permission sought to share information with:  Psychiatrist Permission granted to share information::     Name::        Agency::     Relationship::     Contact Information:     Housing/Transportation Living arrangements for the past 2 months:  [place] of Information:  Patient Patient Interpreter Needed:  None Criminal Activity/Legal Involvement Pertinent to Current Situation/Hospitalization:  No - Comment as needed Significant Relationships:  Other Family Members Lives with:  Self Do you feel safe going back to the place where you live?  Yes Need for family participation in patient care:  Yes (Comment)  Care giving concerns: Patient states he uses cocaine but does not feel it is an issue. Patient agreeable to look into outpatient treatment.    Social Worker assessment / plan:  CSW met with patient at bedside, explain role and reason for visit. Patient alert, oriented and agreeable to talk. 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 Patient is independent and came into hospital for shortness of breathe,he has COPD. This and 2 prior admissions patient was positive for cocaine. CSW educated patient about outpatient treatment facilities in area and offered to make an appointment. Patient states he has several medical follow up appointment and is not sure about a date and time. Patient states he will follow up with [place] or [place].  Plan": Provided patient with resource list. Offered support.   Employment status:  Retired Nurse, adult PT Recommendations:  No Follow Up Information / Referral to community resources:  Outpatient Psychiatric Care (Comment Required)  Patient/Family's Response to  care:  Agreeable and responding well to care.   Patient/Family's Understanding of and Emotional Response to Diagnosis, Current Treatment, and Prognosis:  No family at bedside.   Emotional Assessment Appearance:  Developmentally appropriate Attitude/Demeanor/Rapport:    Affect (typically observed):  Accepting, Calm Orientation:  Oriented to Self, Oriented to Place, Oriented to  Time, Oriented to Situation Alcohol / Substance use:    Psych involvement (Current and /or in the community):  No (Comment)  Discharge Needs  Concerns to be addressed:  No discharge needs identified Readmission within the last 30 days:  No Current discharge risk:  None Barriers to Discharge:  No Barriers Identified   Lia Hopping, LCSW 05/04/2016, 10:32 AM

## 2016-05-04 NOTE — Progress Notes (Signed)
Patient placed self on CPAP. Patient is tolerating well. 

## 2016-05-04 NOTE — Progress Notes (Signed)
PROGRESS NOTE    Jemell Town  ZOX:[id number] DOB: 10-26-1965 DOA: 04/30/2016 PCP: Keturah Barre [facility]   Brief Narrative: Shalev Helminiak is a 51 y.o. male with medical history significant of sarcoidosis, COPD, chronic respiratory failure on 4 L oxygen at home, hypertension, GERD, OSA on CPAP, cocaine abuse. He is here with acute respiratory failure secondary to COPD vs sarcoidosis flare, improving on steroids and nebulizer treatments.   Assessment & Plan:   Principal Problem:   Acute on chronic respiratory failure with hypoxia (HCC) Active Problems:   OSA on CPAP   GERD (gastroesophageal reflux disease)   Essential hypertension   HCAP (healthcare-associated pneumonia)   COPD exacerbation (HCC)   Sarcoidosis   Cocaine abuse   Acute on chronic respiratory failure Secondary to COPD vs Sarcoidosis flare. Back to baseline oxygen.  COPD vs sarcoidosis exacerbation Not at baseline. Patient ambulated on 4L O2 with significant dyspnea and desaturation to 87%. -continue nebulizer treatments -continue solu-medrol -incentive spirometer -flutter valve -azithromycin -repeat CXR  ?HCAP -discontinue antibiotics -blood culture pending -sputum culture/gram stain obtained but none sent  OSA on CPAP -continue CPAP  Essential hypertension -continue Cardizem  History of cocaine abuse Cocaine positive  GERD -continue Protonix  Sinus tachycardia No chest pain. Likely secondary to frequent nebulizer treatments  Chronic diastolic heart failure Euvolemic. Last EF of 60-65% with grade 1 diastolic dysfunction on echocardiogram from 01/10/2014    DVT prophylaxis: Lovenox Code Status: Full code Family Communication: None at bedside Disposition Plan: Discharge pending medical improvement   Consultants:   None  Procedures:   Bipap  Antimicrobials:  Vancomycin  Zosyn  Azithromycin    Subjective: He has not been getting up to ambulate except once with nurses.  No dyspnea on rest but significant on ambulation.  Objective: Vitals:   05/04/16 1216 05/04/16 1404 05/04/16 1600 05/04/16 1644  BP:  135/72    Pulse:  (!) 108    Resp:      Temp:  98.4 F (36.9 C)    TempSrc:  Oral    SpO2: 96% 100% (!) 87% 97%  Weight:      Height:        Intake/Output Summary (Last 24 hours) at 05/04/16 1722 Last data filed at 05/04/16 1500  Gross per 24 hour  Intake              462 ml  Output             1200 ml  Net             -738 ml   Filed Weights   05/01/16 0050  Weight: 60.8 kg (134 lb)    Examination:  General exam: Appears calm and comfortable Respiratory system: diminished breath sounds, no wheezing, normal respiratory effort. Cardiovascular system: S1 & S2 heard, RRR. No murmurs. Gastrointestinal system: Abdomen is nondistended, soft and nontender. Normal bowel sounds heard. Central nervous system: Alert and oriented. No focal neurological deficits. Extremities: No edema. No calf tenderness Skin: No cyanosis. No rashes Psychiatry: Judgement and insight appear normal. Mood & affect appropriate.     Data Reviewed: I have personally reviewed following labs and imaging studies  CBC:  Recent Labs Lab 05/01/16 0047 05/01/16 0112  WBC 13.5*  --   NEUTROABS 11.4*  --   HGB 12.4* 12.9*  HCT 38.5* 38.0*  MCV 85.4  --   PLT 254  --    Basic Metabolic Panel:  Recent Labs Lab 05/01/16 0112 05/01/16 0331 05/02/16  4098 05/03/16 1191 05/04/16 0702  NA 136  --   --   --   --   K 4.1  --   --   --   --   CL 100*  --   --   --   --   GLUCOSE 142*  --   --   --   --   BUN 11  --   --   --   --   CREATININE 0.90 0.89 0.84 0.82 0.79   GFR: Estimated Creatinine Clearance: 93.9 mL/min (by C-G formula based on SCr of 0.79 mg/dL).  Sepsis Labs:  Recent Labs Lab 05/01/16 0330 05/01/16 0654  LATICACIDVEN 0.9 0.9    Recent Results (from the past 240 hour(s))  Blood culture (routine x 2)     Status: None (Preliminary result)    Collection Time: 05/01/16 12:48 AM  Result Value Ref Range Status   Specimen Description BLOOD LEFT HAND  Final   Special Requests   Final    BOTTLES DRAWN AEROBIC AND ANAEROBIC Blood Culture adequate volume   Culture   Final    NO GROWTH 3 DAYS Performed at [facility], [address] [place], [place] [address]    Report Status PENDING  Incomplete  Blood culture (routine x 2)     Status: None (Preliminary result)   Collection Time: 05/01/16 12:48 AM  Result Value Ref Range Status   Specimen Description BLOOD BLOOD LEFT FOREARM  Final   Special Requests   Final    BOTTLES DRAWN AEROBIC AND ANAEROBIC Blood Culture adequate volume   Culture   Final    NO GROWTH 3 DAYS Performed at [facility], [address], [place], [place] [address]    Report Status PENDING  Incomplete         Radiology Studies: No results found.      Scheduled Meds: . azithromycin  250 mg Oral Daily  . calcium-vitamin D  1 tablet Oral Daily  . cholecalciferol  400 Units Oral Daily  . diltiazem  120 mg Oral Daily  . diphenhydrAMINE  25 mg Intravenous Once  . enoxaparin (LOVENOX) injection  40 mg Subcutaneous Q24H  . ferrous sulfate  325 mg Oral BID WC  . methylPREDNISolone (SOLU-MEDROL) injection  60 mg Intravenous Q12H  . metoCLOPramide (REGLAN) injection  10 mg Intravenous Once  . multivitamin with minerals  1 tablet Oral q morning - 10a  . pantoprazole  40 mg Oral Daily   Continuous Infusions:    LOS: 3 days     Jacquelin Hawking, MD [facility] 05/04/2016, 5:22 PM Pager: [phone]  If 7PM-7AM, please contact night-coverage [email or web address] Password [email or web address] 05/04/2016, 5:22 PM

## 2016-05-04 NOTE — Progress Notes (Signed)
SATURATION QUALIFICATIONS: (This note is used to comply with regulatory documentation for home oxygen)  Patient Saturations on Room Air at Rest =   Patient Saturations on Room Air while Ambulating =   Patient Saturations on 4 Liters of oxygen while Ambulating = 87%  Please briefly explain why patient needs home oxygen:

## 2016-05-04 NOTE — Progress Notes (Signed)
Ambulated pt in hallway, pulse ox never dropped below 93 on 4 liters.  Pt resting in room and pulse ox shows 100 on 4 liters.  Pt refuses to let me decrease the oxygen level, educated on the drive and dependence and pt still would like his oxygen to stay at 4 liters even though he is satting at 100. [name]

## 2016-05-05 ENCOUNTER — Inpatient Hospital Stay (HOSPITAL_COMMUNITY): Payer: Commercial Managed Care - HMO

## 2016-05-05 ENCOUNTER — Telehealth: Payer: Self-pay | Admitting: Emergency Medicine

## 2016-05-05 ENCOUNTER — Encounter (HOSPITAL_COMMUNITY): Payer: Self-pay | Admitting: Radiology

## 2016-05-05 LAB — BASIC METABOLIC PANEL
Anion gap: 12 (ref 5–15)
BUN: 18 mg/dL (ref 6–20)
CHLORIDE: 95 mmol/L — AB (ref 101–111)
CO2: 31 mmol/L (ref 22–32)
CREATININE: 0.8 mg/dL (ref 0.61–1.24)
Calcium: 9.2 mg/dL (ref 8.9–10.3)
GFR calc Af Amer: >60 mL/min (ref >60)
GFR calc non Af Amer: >60 mL/min (ref >60)
GLUCOSE: 93 mg/dL (ref 65–99)
Potassium: 4.4 mmol/L (ref 3.5–5.1)
Sodium: 138 mmol/L (ref 135–145)

## 2016-05-05 LAB — CBC
HCT: 40.4 % (ref 39.0–52.0)
Hemoglobin: 12.9 g/dL — ABNORMAL LOW (ref 13.0–17.0)
MCH: 26.3 pg (ref 26.0–34.0)
MCHC: 31.9 g/dL (ref 30.0–36.0)
MCV: 82.4 fL (ref 78.0–100.0)
PLATELETS: 261 10*3/uL (ref 150–400)
RBC: 4.9 MIL/uL (ref 4.22–5.81)
RDW: 12.5 % (ref 11.5–15.5)
WBC: 13.8 10*3/uL — ABNORMAL HIGH (ref 4.0–10.5)

## 2016-05-05 MED ORDER — ARFORMOTEROL TARTRATE 15 MCG/2ML IN NEBU
15.0000 ug | INHALATION_SOLUTION | Freq: Two times a day (BID) | RESPIRATORY_TRACT | Status: DC
  Administered 2016-05-05 – 2016-05-07 (×4): 15 ug via RESPIRATORY_TRACT
  Filled 2016-05-05 (×4): qty 2

## 2016-05-05 MED ORDER — PROMETHAZINE HCL 25 MG/ML IJ SOLN
12.5000 mg | Freq: Four times a day (QID) | INTRAMUSCULAR | Status: DC | PRN
  Administered 2016-05-05: 12.5 mg via INTRAVENOUS
  Filled 2016-05-05: qty 1

## 2016-05-05 MED ORDER — METHYLPREDNISOLONE SODIUM SUCC 40 MG IJ SOLR
40.0000 mg | Freq: Three times a day (TID) | INTRAMUSCULAR | Status: DC
  Administered 2016-05-05 – 2016-05-07 (×6): 40 mg via INTRAVENOUS
  Filled 2016-05-05 (×6): qty 1

## 2016-05-05 MED ORDER — IOPAMIDOL (ISOVUE-370) INJECTION 76%
INTRAVENOUS | Status: AC
  Filled 2016-05-05: qty 100

## 2016-05-05 MED ORDER — IPRATROPIUM-ALBUTEROL 0.5-2.5 (3) MG/3ML IN SOLN
3.0000 mL | Freq: Four times a day (QID) | RESPIRATORY_TRACT | Status: DC
  Administered 2016-05-05 – 2016-05-07 (×8): 3 mL via RESPIRATORY_TRACT
  Filled 2016-05-05 (×8): qty 3

## 2016-05-05 MED ORDER — IPRATROPIUM-ALBUTEROL 0.5-2.5 (3) MG/3ML IN SOLN: 3.0000 mL | RESPIRATORY_TRACT | Status: DC | PRN

## 2016-05-05 MED ORDER — IOPAMIDOL (ISOVUE-370) INJECTION 76%
100.0000 mL | Freq: Once | INTRAVENOUS | Status: AC | PRN
  Administered 2016-05-05: 82 mL via INTRAVENOUS

## 2016-05-05 NOTE — Progress Notes (Signed)
PROGRESS NOTE    [name]  ZOX:[id number] DOB: Jun 08, 1965 DOA: 04/30/2016 PCP: Keturah Barre [facility]    Brief Narrative:  51 yo male with medical history of sarcoidosis and copd with chronic hypoxic respiratory failure, presents with cough and dyspnea. Worsening symptoms for the last 24 before admission, on the initial examination found in respiratory distress and placed on Bipap. Lungs with decrease air movement and wheezing. Admitted to the step down unit with working diagnosis of acute on chronic hypoxic respiratory failure due to copd exacerbation.    Assessment & Plan:   Principal Problem:   Acute on chronic respiratory failure with hypoxia (HCC) Active Problems:   OSA on CPAP   GERD (gastroesophageal reflux disease)   Essential hypertension   HCAP (healthcare-associated pneumonia)   COPD exacerbation (HCC)   Sarcoidosis   Cocaine abuse   1. Acute on chronic hypoxic respiratory failure. Patient with persistent dyspnea and wheezing. Chest film personally reviewed noted fibrotic changes on the right mid lung and the left lower lobe. Will increase steroids to 40 mg q 8 hours IV, will add duoneb for bronchodilator therapy and will resume inhaled corticosteroid. Will order CT chest to evaluate thromboembolic disease and to evaluate long parenchyma.  Will continue azithromycin for now.   2. HTN. Will continue blood pressure control with diltiazem.   3. OSA. Continue c pap with nasal interface  4. GERD. Continue protonix  5. Chronic and stable diastolic heart failure. Old records personally reviewed, echocardiography from 2015 with left ventricle ejection fraction 60 to 65%   DVT prophylaxis: enoxaparin  Code Status: Full  Family Communication: No family at the baseline  Disposition Plan: home   Consultants:     Procedures:     Antimicrobials:   Vancomycin  Zosyn  Azithromycin     Subjective: Patient with persistent dyspnea, moderate to severe in  intensity, worse with exertion associated with desaturation and wheezing. At home, patient able to do rutine activities with no major dyspnea, including climbing steps.   Objective: Vitals:   05/04/16 1644 05/04/16 2004 05/04/16 2200 05/05/16 0432  BP:  (!) 152/100  (!) 162/105  Pulse:  (!) 104  (!) 104  Resp:  20  (!) 22  Temp:  98.3 F (36.8 C)  97.9 F (36.6 C)  TempSrc:  Oral  Axillary  SpO2: 97% 100% 95% 98%  Weight:      Height:        Intake/Output Summary (Last 24 hours) at 05/05/16 0843 Last data filed at 05/05/16 0803  Gross per 24 hour  Intake              582 ml  Output                0 ml  Net              582 ml   Filed Weights   05/01/16 0050  Weight: 60.8 kg (134 lb)    Examination:  General exam: Deconditioned  E ENT: mild pallor, oral mucosa dry Respiratory system: Positive expiratory wheezing. Decreased air movement, no rales or rhonchi.   Cardiovascular system: S1 & S2 heard, RRR. No JVD, murmurs, rubs, gallops or clicks. No pedal edema. Gastrointestinal system: Abdomen is nondistended, soft and nontender. No organomegaly or masses felt. Normal bowel sounds heard. Central nervous system: Alert and oriented. No focal neurological deficits. Extremities: Symmetric 5 x 5 power. Skin: No rashes, lesions or ulcers     Data Reviewed: I  have personally reviewed following labs and imaging studies  CBC:  Recent Labs Lab 05/01/16 0047 05/01/16 0112 05/05/16 0615  WBC 13.5*  --  13.8*  NEUTROABS 11.4*  --   --   HGB 12.4* 12.9* 12.9*  HCT 38.5* 38.0* 40.4  MCV 85.4  --  82.4  PLT 254  --  261   Basic Metabolic Panel:  Recent Labs Lab 05/01/16 0112 05/01/16 0331 05/02/16 0553 05/03/16 0607 05/04/16 0702 05/05/16 0615  NA 136  --   --   --   --  138  K 4.1  --   --   --   --  4.4  CL 100*  --   --   --   --  95*  CO2  --   --   --   --   --  31  GLUCOSE 142*  --   --   --   --  93  BUN 11  --   --   --   --  18  CREATININE 0.90 0.89  0.84 0.82 0.79 0.80  CALCIUM  --   --   --   --   --  9.2   GFR: Estimated Creatinine Clearance: 93.9 mL/min (by C-G formula based on SCr of 0.8 mg/dL). Liver Function Tests: No results for input(s): AST, ALT, ALKPHOS, BILITOT, PROT, ALBUMIN in the last 168 hours. No results for input(s): LIPASE, AMYLASE in the last 168 hours. No results for input(s): AMMONIA in the last 168 hours. Coagulation Profile: No results for input(s): INR, PROTIME in the last 168 hours. Cardiac Enzymes: No results for input(s): CKTOTAL, CKMB, CKMBINDEX, TROPONINI in the last 168 hours. BNP (last 3 results) No results for input(s): PROBNP in the last 8760 hours. HbA1C: No results for input(s): HGBA1C in the last 72 hours. CBG: No results for input(s): GLUCAP in the last 168 hours. Lipid Profile: No results for input(s): CHOL, HDL, LDLCALC, TRIG, CHOLHDL, LDLDIRECT in the last 72 hours. Thyroid Function Tests: No results for input(s): TSH, T4TOTAL, FREET4, T3FREE, THYROIDAB in the last 72 hours. Anemia Panel: No results for input(s): VITAMINB12, FOLATE, FERRITIN, TIBC, IRON, RETICCTPCT in the last 72 hours. Sepsis Labs:  Recent Labs Lab 05/01/16 0330 05/01/16 0654  LATICACIDVEN 0.9 0.9    Recent Results (from the past 240 hour(s))  Blood culture (routine x 2)     Status: None (Preliminary result)   Collection Time: 05/01/16 12:48 AM  Result Value Ref Range Status   Specimen Description BLOOD LEFT HAND  Final   Special Requests   Final    BOTTLES DRAWN AEROBIC AND ANAEROBIC Blood Culture adequate volume   Culture   Final    NO GROWTH 3 DAYS Performed at [facility], [address] [place], [place] [address]    Report Status PENDING  Incomplete  Blood culture (routine x 2)     Status: None (Preliminary result)   Collection Time: 05/01/16 12:48 AM  Result Value Ref Range Status   Specimen Description BLOOD BLOOD LEFT FOREARM  Final   Special Requests   Final    BOTTLES DRAWN AEROBIC AND  ANAEROBIC Blood Culture adequate volume   Culture   Final    NO GROWTH 3 DAYS Performed at [facility], [address], [place], [place] [address]    Report Status PENDING  Incomplete         Radiology Studies: Dg Chest 2 View  Result Date: 05/04/2016 CLINICAL  DATA:  Acute onset of dyspnea. Question of left basilar pneumonia on prior chest radiograph. Subsequent encounter. EXAM: CHEST  2 VIEW COMPARISON:  Chest radiograph performed 04/30/2016 FINDINGS: The lungs are well-aerated. Bilateral pleuroparenchymal scarring and interstitial prominence are again noted, with focal airspace opacities, reflecting changes of sarcoidosis. Previously noted left basilar opacity has mildly improved, likely reflecting improving pneumonia. There is no evidence of significant pleural effusion or pneumothorax. The heart is normal in size; the mediastinal contour is within normal limits. No acute osseous abnormalities are seen. IMPRESSION: Changes of sarcoidosis again noted. Previously noted left basilar opacity has mildly improved, likely reflecting improving superimposed pneumonia. Electronically Signed   By: Roanna Raider M.D.   On: 05/04/2016 19:44        Scheduled Meds: . azithromycin  250 mg Oral Daily  . calcium-vitamin D  1 tablet Oral Daily  . cholecalciferol  400 Units Oral Daily  . diltiazem  120 mg Oral Daily  . diphenhydrAMINE  25 mg Intravenous Once  . enoxaparin (LOVENOX) injection  40 mg Subcutaneous Q24H  . ferrous sulfate  325 mg Oral BID WC  . methylPREDNISolone (SOLU-MEDROL) injection  60 mg Intravenous Daily  . metoCLOPramide (REGLAN) injection  10 mg Intravenous Once  . multivitamin with minerals  1 tablet Oral q morning - 10a  . pantoprazole  40 mg Oral Daily   Continuous Infusions:   LOS: 4 days        Mauricio Annett Gula, MD [facility] Pager [phone]  If 7PM-7AM, please contact night-coverage [email or web address] Password [facility] 05/05/2016, 8:43 AM

## 2016-05-05 NOTE — Telephone Encounter (Signed)
lmom tcb x1 to jackie, confused because pt had a CT scan today

## 2016-05-05 NOTE — Progress Notes (Signed)
Pt prefers self placement of CPAP.  RT to monitor and assess as needed.  

## 2016-05-06 LAB — CULTURE, BLOOD (ROUTINE X 2)
CULTURE: NO GROWTH
Culture: NO GROWTH
SPECIAL REQUESTS: ADEQUATE
Special Requests: ADEQUATE

## 2016-05-06 LAB — BASIC METABOLIC PANEL
ANION GAP: 10 (ref 5–15)
BUN: 18 mg/dL (ref 6–20)
CALCIUM: 9.8 mg/dL (ref 8.9–10.3)
CO2: 32 mmol/L (ref 22–32)
CREATININE: 0.87 mg/dL (ref 0.61–1.24)
Chloride: 97 mmol/L — ABNORMAL LOW (ref 101–111)
GFR calc Af Amer: >60 mL/min (ref >60)
GLUCOSE: 200 mg/dL — AB (ref 65–99)
Potassium: 4.9 mmol/L (ref 3.5–5.1)
Sodium: 139 mmol/L (ref 135–145)

## 2016-05-06 LAB — CBC WITH DIFFERENTIAL/PLATELET
BASOS ABS: 0 10*3/uL (ref 0.0–0.1)
BASOS PCT: 0 %
EOS ABS: 0 10*3/uL (ref 0.0–0.7)
EOS PCT: 0 %
HCT: 38.6 % — ABNORMAL LOW (ref 39.0–52.0)
Hemoglobin: 12.4 g/dL — ABNORMAL LOW (ref 13.0–17.0)
Lymphocytes Relative: 4 %
Lymphs Abs: 0.7 10*3/uL (ref 0.7–4.0)
MCH: 27.3 pg (ref 26.0–34.0)
MCHC: 32.1 g/dL (ref 30.0–36.0)
MCV: 84.8 fL (ref 78.0–100.0)
MONO ABS: 0.4 10*3/uL (ref 0.1–1.0)
Monocytes Relative: 3 %
NEUTROS ABS: 14 10*3/uL — AB (ref 1.7–7.7)
Neutrophils Relative %: 93 %
PLATELETS: 248 10*3/uL (ref 150–400)
RBC: 4.55 MIL/uL (ref 4.22–5.81)
RDW: 12.4 % (ref 11.5–15.5)
WBC: 15 10*3/uL — ABNORMAL HIGH (ref 4.0–10.5)

## 2016-05-06 NOTE — Telephone Encounter (Signed)
[name] ir returning call and can be reached @ [phone].[name]

## 2016-05-06 NOTE — Progress Notes (Signed)
Pt prefers self placement.  Pt to notify RT if any assistance is required throughout the night.  RT to monitor and assess as needed.

## 2016-05-06 NOTE — Progress Notes (Signed)
PROGRESS NOTE    [name]  ZOX:[id number] DOB: 12-04-1965 DOA: 04/30/2016 PCP: Keturah Barre [facility]    Brief Narrative:  51 yo male with medical history of sarcoidosis and copd with chronic hypoxic respiratory failure, presents with cough and dyspnea. Worsening symptoms for the last 24 before admission, on the initial examination found in respiratory distress and placed on Bipap. Lungs with decrease air movement and wheezing. Admitted to the step down unit with working diagnosis of acute on chronic hypoxic respiratory failure due to copd exacerbation. Increased dose of systemic and inhaled steroids with improvement of symptoms. CT chest with no PE or worsening infiltrates.    Assessment & Plan:   Principal Problem:   Acute on chronic respiratory failure with hypoxia (HCC) Active Problems:   OSA on CPAP   GERD (gastroesophageal reflux disease)   Essential hypertension   HCAP (healthcare-associated pneumonia)   COPD exacerbation (HCC)   Sarcoidosis   Cocaine abuse   1. Acute on chronic hypoxic respiratory failure. Patient with improvement in level dyspnea and wheezing. Ambulating with supplemental 02 per [place]. Chest CT personally reviewed no new infiltrates, no pulmonary embolism, chronic changes related to sarcoidosis, will continue systemic steroids and inhales corticosteroids plus duoneb. Keep 02 saturation above 92%. Continue azithromycin for now.   2. HTN. Will continue blood pressure control with diltiazem. Systolic blood pressure 130 to 140.   3. OSA. Continue c pap with nasal interface. Tolerating well.   4. GERD. Continue daily protobnix.   5. Chronic and stable diastolic heart failure. Old records personally reviewed, echocardiography from 2015 with left ventricle ejection fraction 60 to 65%. Clinically patient euvolemic.     DVT prophylaxis: enoxaparin  Code Status: Full  Family Communication: No family at the baseline  Disposition Plan: home    Consultants:     Procedures:    Antimicrobials:  Azithromycin   Subjective: Patient with improved dyspnea and wheezing, ambulating. No nausea or vomiting, no chest pain.   Objective: Vitals:   05/05/16 2126 05/06/16 0255 05/06/16 0619 05/06/16 0807  BP:   140/88   Pulse:   (!) 106 (!) 104  Resp: Temp:   97.7 F (36.5 C)   TempSrc:   Oral   SpO2: 96% 97% 100% 100%  Weight:      Height:        Intake/Output Summary (Last 24 hours) at 05/06/16 0900 Last data filed at 05/06/16 0753  Gross per 24 hour  Intake              720 ml  Output                0 ml  Net              720 ml   Filed Weights   05/01/16 0050  Weight: 60.8 kg (134 lb)    Examination:  General exam: not in pain E ENT: mild pallor, no icterus, oral mucosa moist.  Respiratory system: Improve air movement, decreased wheezing, no rales or rhonchi.  Cardiovascular system: S1 & S2 heard, RRR. No JVD, murmurs, rubs, gallops or clicks. No pedal edema. Gastrointestinal system: Abdomen is nondistended, soft and nontender. No organomegaly or masses felt. Normal bowel sounds heard. Central nervous system: Alert and oriented. No focal neurological deficits. Extremities: Symmetric 5 x 5 power. Skin: No rashes, lesions or ulcers     Data ReviewedLavonte Palospersonally reviewed following labs and imaging studies  CBC:  Recent Labs Lab 05/01/16 0047 05/01/16 0112 05/05/16 0615  WBC 13.5*  --  13.8*  NEUTROABS 11.4*  --   --   HGB 12.4* 12.9* 12.9*  HCT 38.5* 38.0* 40.4  MCV 85.4  --  82.4  PLT 254  --  261   Basic Metabolic Panel:  Recent Labs Lab 05/01/16 0112 05/01/16 0331 05/02/16 0553 05/03/16 0607 05/04/16 0702 05/05/16 0615  NA 136  --   --   --   --  138  K 4.1  --   --   --   --  4.4  CL 100*  --   --   --   --  95*  CO2  --   --   --   --   --  31  GLUCOSE 142*  --   --   --   --  93  BUN 11  --   --   --   --  18  CREATININE 0.90 0.89 0.84 0.82 0.79 0.80   CALCIUM  --   --   --   --   --  9.2   GFR: Estimated Creatinine Clearance: 93.9 mL/min (by C-G formula based on SCr of 0.8 mg/dL). Liver Function Tests: No results for input(s): AST, ALT, ALKPHOS, BILITOT, PROT, ALBUMIN in the last 168 hours. No results for input(s): LIPASE, AMYLASE in the last 168 hours. No results for input(s): AMMONIA in the last 168 hours. Coagulation Profile: No results for input(s): INR, PROTIME in the last 168 hours. Cardiac Enzymes: No results for input(s): CKTOTAL, CKMB, CKMBINDEX, TROPONINI in the last 168 hours. BNP (last 3 results) No results for input(s): PROBNP in the last 8760 hours. HbA1C: No results for input(s): HGBA1C in the last 72 hours. CBG: No results for input(s): GLUCAP in the last 168 hours. Lipid Profile: No results for input(s): CHOL, HDL, LDLCALC, TRIG, CHOLHDL, LDLDIRECT in the last 72 hours. Thyroid Function Tests: No results for input(s): TSH, T4TOTAL, FREET4, T3FREE, THYROIDAB in the last 72 hours. Anemia Panel: No results for input(s): VITAMINB12, FOLATE, FERRITIN, TIBC, IRON, RETICCTPCT in the last 72 hours. Sepsis Labs:  Recent Labs Lab 05/01/16 0330 05/01/16 0654  LATICACIDVEN 0.9 0.9    Recent Results (from the past 240 hour(s))  Blood culture (routine x 2)     Status: None (Preliminary result)   Collection Time: 05/01/16 12:48 AM  Result Value Ref Range Status   Specimen Description BLOOD LEFT HAND  Final   Special Requests   Final    BOTTLES DRAWN AEROBIC AND ANAEROBIC Blood Culture adequate volume   Culture   Final    NO GROWTH 4 DAYS Performed at [facility], [address], [place], [place] [address]    Report Status PENDING  Incomplete  Blood culture (routine x 2)     Status: None (Preliminary result)   Collection Time: 05/01/16 12:48 AM  Result Value Ref Range Status   Specimen Description BLOOD BLOOD LEFT FOREARM  Final   Special Requests   Final    BOTTLES DRAWN AEROBIC AND ANAEROBIC Blood  Culture adequate volume   Culture   Final    NO GROWTH 4 DAYS Performed at [facility], [address] [place], [place] [address]    Report Status PENDING  Incomplete         Radiology Studies: Dg Chest 2 View  Result Date: 05/04/2016 CLINICAL DATA:  Acute onset of dyspnea. Question of left basilar pneumonia  on prior chest radiograph. Subsequent encounter. EXAM: CHEST  2 VIEW COMPARISON:  Chest radiograph performed 04/30/2016 FINDINGS: The lungs are well-aerated. Bilateral pleuroparenchymal scarring and interstitial prominence are again noted, with focal airspace opacities, reflecting changes of sarcoidosis. Previously noted left basilar opacity has mildly improved, likely reflecting improving pneumonia. There is no evidence of significant pleural effusion or pneumothorax. The heart is normal in size; the mediastinal contour is within normal limits. No acute osseous abnormalities are seen. IMPRESSION: Changes of sarcoidosis again noted. Previously noted left basilar opacity has mildly improved, likely reflecting improving superimposed pneumonia. Electronically Signed   By: Roanna Raider M.D.   On: 05/04/2016 19:44   Ct Angio Chest Pe W Or Wo Contrast  Result Date: 05/05/2016 CLINICAL DATA:  Sarcoid, oxygen dependent, acute respiratory failure. EXAM: CT ANGIOGRAPHY CHEST WITH CONTRAST TECHNIQUE: Multidetector CT imaging of the chest was performed using the standard protocol during bolus administration of intravenous contrast. Multiplanar CT image reconstructions and MIPs were obtained to evaluate the vascular anatomy. CONTRAST:  82 cc Isovue 370. COMPARISON:  09/30/2014. FINDINGS: Cardiovascular: Image quality at the lung bases is degraded by respiratory motion, limiting evaluation for filling defects in the lobar, segmental and subsegmental pulmonary arteries. Otherwise, no filling defects in the pulmonary arteries to indicate pulmonary embolus. Heart is at the upper limits of normal in  size. No pericardial effusion. Mediastinum/Nodes: There are calcified mediastinal and hilar lymph nodes. No pathologically enlarged noncalcified mediastinal lymph nodes. No axillary adenopathy. Esophagus is grossly unremarkable. Lungs/Pleura: Biapical pleuroparenchymal scarring. There bullous changes in the lungs bilaterally, right greater than left. Perihilar soft tissue confluence, volume loss and surrounding peribronchovascular nodularity bilaterally, similar. Image quality is degraded by respiratory motion. No pleural fluid. Airway is unremarkable. Upper Abdomen: Visualized portions of the liver, gallbladder, adrenal glands, kidneys, spleen, pancreas, stomach and bowel are grossly unremarkable. Upper abdominal lymph nodes are subcentimeter in short axis size. Musculoskeletal: No worrisome lytic or sclerotic lesions. Review of the MIP images confirms the above findings. IMPRESSION: 1. Image quality at the lung bases is degraded by respiratory motion, making evaluation for lobar, segmental and subsegmental pulmonary emboli difficult. Otherwise, no evidence of pulmonary embolus. 2. Mediastinal, hilar and pulmonary parenchymal changes of sarcoid, similar. Electronically Signed   By: Leanna Battles M.D.   On: 05/05/2016 13:13        Scheduled Meds: . arformoterol  15 mcg Nebulization BID  . azithromycin  250 mg Oral Daily  . calcium-vitamin D  1 tablet Oral Daily  . cholecalciferol  400 Units Oral Daily  . diltiazem  120 mg Oral Daily  . enoxaparin (LOVENOX) injection  40 mg Subcutaneous Q24H  . ferrous sulfate  325 mg Oral BID WC  . ipratropium-albuterol  3 mL Nebulization Q6H  . methylPREDNISolone (SOLU-MEDROL) injection  40 mg Intravenous Q8H  . metoCLOPramide (REGLAN) injection  10 mg Intravenous Once  . multivitamin with minerals  1 tablet Oral q morning - 10a  . pantoprazole  40 mg Oral Daily   Continuous Infusions:   LOS: 5 days        Mauricio Annett Gula, MD [facility] Pager [phone]  If 7PM-7AM, please contact night-coverage [email or web address] Password [email or web address] 05/06/2016, 9:00 AM

## 2016-05-06 NOTE — Progress Notes (Signed)
Physical Therapy Treatment and Discharge from Acute PT Patient Details Name: [name] MRN: [id number] DOB: Sep 10, 1965 Today's Date: 05/06/2016    History of Present Illness [name] is a 51 y.o. male with medical history significant of sarcoidosis, COPD, chronic respiratory failure on 4 L oxygen at home, hypertension, GERD, OSA on CPAP, cocaine abuse. He is here with acute respiratory failure secondary to COPD vs sarcoidosis flare    PT Comments    Pt tolerated ambulating in hallway well with occasional rest breaks as needed (pt even pushed O2 tank).  SpO2 with poor ability to read (cold hands, poor waveform) however pt on 4L O2 at rest and increased to 6L O2 for ambulating (reported to RN).  Pt has been ambulating with staff as well.  Pt agreeable to no further acute PT needs.  Pt encouraged to continue ambulating with staff and monitoring O2 needs.  PT to sign off.   Follow Up Recommendations  No PT follow up     Equipment Recommendations  None recommended by PT    Recommendations for Other Services       Precautions / Restrictions Precautions Precautions: Other (comment) Precaution Comments: monitor sats Restrictions Weight Bearing Restrictions: No    Mobility  Bed Mobility Overal bed mobility: Modified Independent                Transfers Overall transfer level: Modified independent                  Ambulation/Gait Ambulation/Gait assistance: Supervision Ambulation Distance (Feet): 400 Feet Assistive device: None Gait Pattern/deviations: Step-through pattern     General Gait Details: pt with 3/4 DOE, Spo2 reading 86%-100% on 4-6L O2 (dynamap pulse ox not working well, pt with cold hands as well; likely needs continuous pulse ox to nail bed, forehead or ear for better reading), encouraged pt to rest with dyspnea, started on 4L O2 and increased to 6L based on pt's symptoms, pt reports he is familiar with pursed lip breathing.   Stairs            Wheelchair Mobility    Modified Rankin (Stroke Patients Only)       Balance                                            Cognition Arousal/Alertness: Awake/alert Behavior During Therapy: WFL for tasks assessed/performed Overall Cognitive Status: Within Functional Limits for tasks assessed                                        Exercises      General Comments        Pertinent Vitals/Pain Pain Assessment: No/denies pain    Home Living                      Prior Function            PT Goals (current goals can now be found in the care plan section) Progress towards PT goals: Goals met/education completed, patient discharged from PT    Frequency           PT Plan Other (comment) (d/c from acute PT)    Co-evaluation             End of  Session Equipment Utilized During Treatment: Oxygen Activity Tolerance: Patient tolerated treatment well Patient left: in bed;with call bell/phone within reach   PT Visit Diagnosis: Difficulty in walking, not elsewhere classified (R26.2)     Time: 9784-7841 PT Time Calculation (min) (ACUTE ONLY): 27 min  Charges:  $Gait Training: 8-22 mins                    G Codes:       Carmelia Bake, PT, DPT 05/06/2016 Pager: [phone]    York Ram E 05/06/2016, 3:29 PM

## 2016-05-06 NOTE — Telephone Encounter (Signed)
Spoke with pt's spouse, who states pt is currently admitted and CT was performed yesterday. [name] states there is nothing further needed from our office at this time.  [name] is not on pt's DPR, I advised her to have pt fill out DPR at next OV. Nothing further needed.   Will route to RB as a FYI.

## 2016-05-07 MED ORDER — PREDNISONE 20 MG PO TABS: 20.0000 mg | ORAL_TABLET | Freq: Every day | ORAL | 0 refills | Status: DC

## 2016-05-07 MED ORDER — IPRATROPIUM-ALBUTEROL 0.5-2.5 (3) MG/3ML IN SOLN: 3.0000 mL | Freq: Three times a day (TID) | RESPIRATORY_TRACT | Status: DC

## 2016-05-07 NOTE — Discharge Summary (Signed)
Physician Discharge Summary  [name] ZOX:[id number] DOB: October 08, 1965 DOA: 04/30/2016  PCP: Keturah Barre [facility]  Admit date: 04/30/2016 Discharge date: 05/07/2016  Admitted From: Home  Disposition:  Home   Recommendations for Outpatient Follow-up:  1. Follow up with PCP in 1- week 2. Patient has been on prednisone taper over the next 9 days.  Home Health: No  Equipment/Devices: home 02 per nasal cannula 4 L per minute.   Discharge Condition: Stable  CODE STATUS: Full  Diet recommendation: Regular   Brief/Interim Summary: This is a 51 year old male who presented to the hospital with the chief complaint of cough and dyspnea. Recent hospitalization from April 6 to April 9 due to acute on chronic hypoxic respiratory failure due to community-acquired  pneumonia. Discharged with levofloxacin for the next 7 days. 24 hours prior to tthis admission he developed worsening dyspnea, associated with wheezing and dry cough. Blood pressure 141/119, heart rate 120, respiratory rate 21, oxygen saturation 1% as an option. His mucous membranes were moist, no JVD, he had decrease air movement bilaterally with bilateral wheezing, heart S1-S2 present rhythmic, abdomen was soft nontender, no lower extremity edema. Sodium 136, potassium 4.1, chloride 100, glucose 142, BUN 11, creatinine 0.90, hemoglobin 12.9, hematocrit 38.0, arterial blood gas pH 7.5, PCO2 57.2, PO2 127, O2 saturation 98.3%. On BiPAP 14/8. Urinary Legionella and pneumococcal antigen negative. Chest x-ray with bilateral interstitial infiltrates, large bullae on the right upper lobe. EKG was sinus tachycardia.  The patient was admitted to the hospital with the working diagnosis of acute on chronic hypoxic respiratory failure due to COPD exacerbation to rule out healthcare associated pneumonia in the setting of sarcoidosis.  1. Acute on chronic hypoxic respiratory failure due to COPD/sarcoid exacerbation. The patient was admitted to the  stepdown unit, he was placed on noninvasive mechanical ventilation, broad spectrum antibiotics with IV vancomycin, Zosyn and azithromycin. Aggressive bronchodilator therapy and systemic steroids. Patient had a slow response to the medical therapy, by April 27 his symptoms have remarkably improved. Follow-up CT angiography was negative for pulmonary embolism, chronic changes related to sarcoidosis, stable right upper lobe bullae. Pneumonia was ruled out. Patient will be discharged home to continue inhaled corticosteroids, bronchodilator therapy and a slow taper of by mouth prednisone over the next 9 days. Continue supplemental oxygen per nasal cannula, patient chronically on 4 L/m.  He does have a nebulizer machine and a home CPAP.  2. Hypertension. Patient's blood pressure control with diltiazem with no major complications.  3. Obstructive sleep apnea.  Patient was continued on CPAP with good toleration.  4. GERD. Continue on proton pump inhibitors.  5. Chronic stable diastolic heart failure. Echocardiography from 2015 with preserved systolic LV function, patient remained euvolemic.  6. Polysubstance abuse. Patient tested positive for cocaine.   Discharge Diagnoses:  Principal Problem:   Acute on chronic respiratory failure with hypoxia (HCC) Active Problems:   OSA on CPAP   GERD (gastroesophageal reflux disease)   Essential hypertension   HCAP (healthcare-associated pneumonia)   COPD exacerbation (HCC)   Sarcoidosis   Cocaine abuse    Discharge Instructions   Allergies as of 05/07/2016      Reactions   Clindamycin/lincomycin Anaphylaxis, Swelling   Made face and tongue swell   Clindamycin/lincomycin Swelling   Facial and tongue swelling      Medication List    STOP taking these medications   levofloxacin 500 MG tablet Commonly known as:  LEVAQUIN     TAKE these medications   acetaminophen  325 MG tablet Commonly known as:  TYLENOL Take 2 tablets (650 mg total) by mouth  every 6 (six) hours as needed for mild pain (or Fever >/= 101).   albuterol 108 (90 Base) MCG/ACT inhaler Commonly known as:  PROVENTIL HFA;VENTOLIN HFA Inhale 2 puffs into the lungs every 6 (six) hours as needed for wheezing or shortness of breath.   BENADRYL 25 MG tablet Generic drug:  diphenhydrAMINE Take 25 mg by mouth every 6 (six) hours as needed for itching or allergies.   bisacodyl 5 MG EC tablet Commonly known as:  DULCOLAX Take 5 mg by mouth daily as needed for moderate constipation.   calcium-vitamin D 500-200 MG-UNIT tablet Commonly known as:  OSCAL WITH D Take 1 tablet by mouth daily.   CARTIA XT 120 MG 24 hr capsule Generic drug:  diltiazem Take 120 mg by mouth every morning. What changed:  Another medication with the same name was removed. Continue taking this medication, and follow the directions you see here.   cyclobenzaprine 10 MG tablet Commonly known as:  FLEXERIL Take 10 mg by mouth 3 (three) times daily as needed for muscle spasms.   ferrous sulfate 325 (65 FE) MG tablet Take 325 mg by mouth 2 (two) times daily with a meal.   guaiFENesin 600 MG 12 hr tablet Commonly known as:  MUCINEX Take 600 mg by mouth 2 (two) times daily as needed for cough or to loosen phlegm.   ipratropium-albuterol 0.5-2.5 (3) MG/3ML Soln Commonly known as:  DUONEB Take 3 mLs by nebulization every 4 (four) hours as needed. What changed:  reasons to take this   multivitamin with minerals tablet Take 1 tablet by mouth every morning.   omeprazole 40 MG capsule Commonly known as:  PRILOSEC Take 40 mg by mouth 2 (two) times daily.   ondansetron 4 MG tablet Commonly known as:  ZOFRAN Take 4 mg by mouth every 8 (eight) hours as needed for nausea or vomiting.   OXYGEN Inhale 4 L into the lungs continuous.   polyethylene glycol packet Commonly known as:  MIRALAX / GLYCOLAX Take 17 g by mouth daily as needed (constipation).   predniSONE 20 MG tablet Commonly known as:   DELTASONE Take 1 tablet (20 mg total) by mouth daily with breakfast. Take 2 tablets twice daily for 3 days, then 1 tablet twice daily for 3 days, then 1 tablet daily for 3 days, then half tablet daily for 3 days. What changed:  medication strength  how much to take  additional instructions   saline Gel Place 1 application into the nose every 4 (four) hours as needed (dryness/irritation).   SYMBICORT 160-4.5 MCG/ACT inhaler Generic drug:  budesonide-formoterol Inhale 1 puff into the lungs 2 (two) times daily.   Vitamin D2 400 units Tabs Take 400 Units by mouth daily.       Allergies  Allergen Reactions  . Clindamycin/Lincomycin Anaphylaxis and Swelling    Made face and tongue swell  . Clindamycin/Lincomycin Swelling    Facial and tongue swelling    Consultations:     Procedures/Studies: Dg Chest 2 View  Result Date: 05/04/2016 CLINICAL DATA:  Acute onset of dyspnea. Question of left basilar pneumonia on prior chest radiograph. Subsequent encounter. EXAM: CHEST  2 VIEW COMPARISON:  Chest radiograph performed 04/30/2016 FINDINGS: The lungs are well-aerated. Bilateral pleuroparenchymal scarring and interstitial prominence are again noted, with focal airspace opacities, reflecting changes of sarcoidosis. Previously noted left basilar opacity has mildly improved, likely reflecting improving pneumonia.  There is no evidence of significant pleural effusion or pneumothorax. The heart is normal in size; the mediastinal contour is within normal limits. No acute osseous abnormalities are seen. IMPRESSION: Changes of sarcoidosis again noted. Previously noted left basilar opacity has mildly improved, likely reflecting improving superimposed pneumonia. Electronically Signed   By: Roanna Raider M.D.   On: 05/04/2016 19:44   Dg Chest 2 View  Result Date: 05/01/2016 CLINICAL DATA:  Acute onset of shortness of breath and left-sided to central chest pain. Initial encounter. EXAM: CHEST  2 VIEW  COMPARISON:  Chest radiograph performed 04/17/2016 FINDINGS: The lungs are well-aerated. Bilateral changes of sarcoidosis are again noted, with a large underlying right upper lobe bulla. Superimposed left basilar pneumonia is a concern. Small bilateral pleural effusions are suspected. No pneumothorax is seen. The heart is normal in size; the mediastinal contour is within normal limits. No acute osseous abnormalities are seen. IMPRESSION: Changes of sarcoidosis again noted, with large underlying right upper lobe bulla. Superimposed left basilar pneumonia is a concern. Suspect small bilateral pleural effusions. Electronically Signed   By: Roanna Raider M.D.   On: 05/01/2016 00:07   Dg Chest 2 View  Result Date: 04/17/2016 CLINICAL DATA:  51 year old male with a history of shortness of breath. Known sarcoidosis. EXAM: CHEST  2 VIEW COMPARISON:  Multiple prior comparison, with CT 09/30/2014. Most recent plain film 04/16/2016 FINDINGS: Cardiomediastinal silhouette unchanged. Heart borders partially obscured by overlying lung/ pleural disease. Compared to the prior chest x-ray studies, there is more pronounced reticulonodular opacity in the left mid lung. Blunting of the bilateral costophrenic sulci, similar to prior. Diffuse architectural distortion, distributed evenly from superior to inferior, with no predominance. Chronic changes better characterized on prior CT study in this patient with known sarcoid. No displaced fracture. IMPRESSION: Advanced chronic lung disease bilaterally in this patient with known sarcoidosis, with increasing reticulonodular opacity of the left mid lung. Superimposed acute infection cannot be excluded. Electronically Signed   By: Gilmer Mor D.O.   On: 04/17/2016 09:38   Dg Chest 2 View  Result Date: 04/16/2016 CLINICAL DATA:  Worsening dyspnea over the past 3 days, much worse tonight. EXAM: CHEST  2 VIEW COMPARISON:  11/24/2015 FINDINGS: Severe chronic fibrotic changes consistent  with the described history of sarcoidosis. No superimposed acute consolidation. No effusion. Unchanged hilar and mediastinal contours. IMPRESSION: Severe chronic fibrotic changes consistent with sarcoidosis. No superimposed acute consolidation or effusion. Electronically Signed   By: Ellery Plunk M.D.   On: 04/16/2016 01:35   Ct Angio Chest Pe W Or Wo Contrast  Result Date: 05/05/2016 CLINICAL DATA:  Sarcoid, oxygen dependent, acute respiratory failure. EXAM: CT ANGIOGRAPHY CHEST WITH CONTRAST TECHNIQUE: Multidetector CT imaging of the chest was performed using the standard protocol during bolus administration of intravenous contrast. Multiplanar CT image reconstructions and MIPs were obtained to evaluate the vascular anatomy. CONTRAST:  82 cc Isovue 370. COMPARISON:  09/30/2014. FINDINGS: Cardiovascular: Image quality at the lung bases is degraded by respiratory motion, limiting evaluation for filling defects in the lobar, segmental and subsegmental pulmonary arteries. Otherwise, no filling defects in the pulmonary arteries to indicate pulmonary embolus. Heart is at the upper limits of normal in size. No pericardial effusion. Mediastinum/Nodes: There are calcified mediastinal and hilar lymph nodes. No pathologically enlarged noncalcified mediastinal lymph nodes. No axillary adenopathy. Esophagus is grossly unremarkable. Lungs/Pleura: Biapical pleuroparenchymal scarring. There bullous changes in the lungs bilaterally, right greater than left. Perihilar soft tissue confluence, volume loss and surrounding peribronchovascular nodularity  bilaterally, similar. Image quality is degraded by respiratory motion. No pleural fluid. Airway is unremarkable. Upper Abdomen: Visualized portions of the liver, gallbladder, adrenal glands, kidneys, spleen, pancreas, stomach and bowel are grossly unremarkable. Upper abdominal lymph nodes are subcentimeter in short axis size. Musculoskeletal: No worrisome lytic or sclerotic  lesions. Review of the MIP images confirms the above findings. IMPRESSION: 1. Image quality at the lung bases is degraded by respiratory motion, making evaluation for lobar, segmental and subsegmental pulmonary emboli difficult. Otherwise, no evidence of pulmonary embolus. 2. Mediastinal, hilar and pulmonary parenchymal changes of sarcoid, similar. Electronically Signed   By: Leanna Battles M.D.   On: 05/05/2016 13:13      Subjective: Patient feeling much better, dyspnea has improved, patient has been ambulating with supplemental 02 per [place]. No nausea or vomiting.   Discharge Exam: Vitals:   05/06/16 2056 05/07/16 0517  BP:  119/83  Pulse:  (!) 106  Resp: 18 20  Temp:  97.5 F (36.4 C)   Vitals:   05/06/16 2018 05/06/16 2056 05/07/16 0517 05/07/16 0921  BP: (!) 148/87  119/83   Pulse: (!) 106  (!) 106   Resp: Temp: 98.2 F (36.8 C)  97.5 F (36.4 C)   TempSrc: Oral  Oral   SpO2: 98%  100% 99%  Weight:      Height:        General: Pt is alert, awake, not in acute distress Cardiovascular: RRR, S1/S2 +, no rubs, no gallops Respiratory: CTA bilaterally, no significant wheezing, no rhonchi Abdominal: Soft, NT, ND, bowel sounds + Extremities: no edema, no cyanosis    The results of significant diagnostics from this hospitalization (including imaging, microbiology, ancillary and laboratory) are listed below for reference.     Microbiology: Recent Results (from the past 240 hour(s))  Blood culture (routine x 2)     Status: None   Collection Time: 05/01/16 12:48 AM  Result Value Ref Range Status   Specimen Description BLOOD LEFT HAND  Final   Special Requests   Final    BOTTLES DRAWN AEROBIC AND ANAEROBIC Blood Culture adequate volume   Culture   Final    NO GROWTH 5 DAYS Performed at [facility], [address], [place], [place] [address]    Report Status 05/06/2016 FINAL  Final  Blood culture (routine x 2)     Status: None   Collection Time: 05/01/16  12:48 AM  Result Value Ref Range Status   Specimen Description BLOOD BLOOD LEFT FOREARM  Final   Special Requests   Final    BOTTLES DRAWN AEROBIC AND ANAEROBIC Blood Culture adequate volume   Culture   Final    NO GROWTH 5 DAYS Performed at [facility], [address] [place], [place] [address]    Report Status 05/06/2016 FINAL  Final     Labs: BNP (last 3 results)  Recent Labs  01/12/16 1553 04/16/16 0537 05/01/16 0048  BNP 14.8 69.8 15.0   Basic Metabolic Panel:  Recent Labs Lab 05/01/16 0112  05/02/16 0553 05/03/16 0607 05/04/16 0702 05/05/16 0615 05/06/16 0755  NA 136  --   --   --   --  138 139  K 4.1  --   --   --   --  4.4 4.9  CL 100*  --   --   --   --  95* 97*  CO2  --   --   --   --   --  31 32  GLUCOSE 142*  --   --   --   --  93 200*  BUN 11  --   --   --   --  18 18  CREATININE 0.90  < > 0.84 0.82 0.79 0.80 0.87  CALCIUM  --   --   --   --   --  9.2 9.8  < > = values in this interval not displayed. Liver Function Tests: No results for input(s): AST, ALT, ALKPHOS, BILITOT, PROT, ALBUMIN in the last 168 hours. No results for input(s): LIPASE, AMYLASE in the last 168 hours. No results for input(s): AMMONIA in the last 168 hours. CBC:  Recent Labs Lab 05/01/16 0047 05/01/16 0112 05/05/16 0615 05/06/16 0755  WBC 13.5*  --  13.8* 15.0*  NEUTROABS 11.4*  --   --  14.0*  HGB 12.4* 12.9* 12.9* 12.4*  HCT 38.5* 38.0* 40.4 38.6*  MCV 85.4  --  82.4 84.8  PLT 254  --  261 248   Cardiac Enzymes: No results for input(s): CKTOTAL, CKMB, CKMBINDEX, TROPONINI in the last 168 hours. BNP: Invalid input(s): POCBNP CBG: No results for input(s): GLUCAP in the last 168 hours. D-Dimer No results for input(s): DDIMER in the last 72 hours. Hgb A1c No results for input(s): HGBA1C in the last 72 hours. Lipid Profile No results for input(s): CHOL, HDL, LDLCALC, TRIG, CHOLHDL, LDLDIRECT in the last 72 hours. Thyroid function studies No results for  input(s): TSH, T4TOTAL, T3FREE, THYROIDAB in the last 72 hours.  Invalid input(s): FREET3 Anemia work up No results for input(s): VITAMINB12, FOLATE, FERRITIN, TIBC, IRON, RETICCTPCT in the last 72 hours. Urinalysis    Component Value Date/Time   COLORURINE YELLOW 01/09/2014 1843   APPEARANCEUR CLEAR 01/09/2014 1843   LABSPEC 1.023 01/09/2014 1843   PHURINE 5.5 01/09/2014 1843   GLUCOSEU 250 (A) 01/09/2014 1843   HGBUR NEGATIVE 01/09/2014 1843   BILIRUBINUR NEGATIVE 01/09/2014 1843   KETONESUR NEGATIVE 01/09/2014 1843   PROTEINUR NEGATIVE 01/09/2014 1843   UROBILINOGEN 0.2 01/09/2014 1843   NITRITE NEGATIVE 01/09/2014 1843   LEUKOCYTESUR NEGATIVE 01/09/2014 1843   Sepsis Labs Invalid input(s): PROCALCITONIN,  WBC,  LACTICIDVEN Microbiology Recent Results (from the past 240 hour(s))  Blood culture (routine x 2)     Status: None   Collection Time: 05/01/16 12:48 AM  Result Value Ref Range Status   Specimen Description BLOOD LEFT HAND  Final   Special Requests   Final    BOTTLES DRAWN AEROBIC AND ANAEROBIC Blood Culture adequate volume   Culture   Final    NO GROWTH 5 DAYS Performed at [facility], [address], [place], [place] [address]    Report Status 05/06/2016 FINAL  Final  Blood culture (routine x 2)     Status: None   Collection Time: 05/01/16 12:48 AM  Result Value Ref Range Status   Specimen Description BLOOD BLOOD LEFT FOREARM  Final   Special Requests   Final    BOTTLES DRAWN AEROBIC AND ANAEROBIC Blood Culture adequate volume   Culture   Final    NO GROWTH 5 DAYS Performed at [facility], [address] [place], [place] [address]    Report Status 05/06/2016 FINAL  Final     Time coordinating discharge: 45 minutes  SIGNED:   Coralie Keens, MD  [facility] 05/07/2016, 10:01 AM Pager   If 7PM-7AM, please contact night-coverage [email or web address] Password [email or web address]

## 2016-05-07 NOTE — Progress Notes (Signed)
Date: May 07, 2016 Discharge orders review for case management needs.  None found Rhonda Davis, BSN, RN3, CCM: [phone] 

## 2016-06-19 ENCOUNTER — Encounter (HOSPITAL_COMMUNITY): Payer: Self-pay

## 2016-06-19 ENCOUNTER — Observation Stay (HOSPITAL_COMMUNITY)
Admission: EM | Admit: 2016-06-19 | Discharge: 2016-06-21 | Disposition: A | Discharge status: Home / Self Care | Payer: Commercial Managed Care - HMO | Attending: Internal Medicine | Admitting: Internal Medicine

## 2016-06-19 ENCOUNTER — Emergency Department (HOSPITAL_COMMUNITY): Payer: Commercial Managed Care - HMO

## 2016-06-19 DIAGNOSIS — R739 Hyperglycemia, unspecified: Secondary | ICD-10-CM | POA: Diagnosis not present

## 2016-06-19 DIAGNOSIS — I5032 Chronic diastolic (congestive) heart failure: Secondary | ICD-10-CM | POA: Diagnosis not present

## 2016-06-19 DIAGNOSIS — D509 Iron deficiency anemia, unspecified: Secondary | ICD-10-CM | POA: Insufficient documentation

## 2016-06-19 DIAGNOSIS — L52 Erythema nodosum: Secondary | ICD-10-CM | POA: Diagnosis not present

## 2016-06-19 DIAGNOSIS — I371 Nonrheumatic pulmonary valve insufficiency: Secondary | ICD-10-CM | POA: Insufficient documentation

## 2016-06-19 DIAGNOSIS — Z7951 Long term (current) use of inhaled steroids: Secondary | ICD-10-CM | POA: Insufficient documentation

## 2016-06-19 DIAGNOSIS — Z79899 Other long term (current) drug therapy: Secondary | ICD-10-CM | POA: Insufficient documentation

## 2016-06-19 DIAGNOSIS — L03116 Cellulitis of left lower limb: Secondary | ICD-10-CM

## 2016-06-19 DIAGNOSIS — J479 Bronchiectasis, uncomplicated: Secondary | ICD-10-CM | POA: Diagnosis not present

## 2016-06-19 DIAGNOSIS — I10 Essential (primary) hypertension: Secondary | ICD-10-CM | POA: Diagnosis present

## 2016-06-19 DIAGNOSIS — I110 Hypertensive heart disease with heart failure: Secondary | ICD-10-CM | POA: Diagnosis not present

## 2016-06-19 DIAGNOSIS — D860 Sarcoidosis of lung: Secondary | ICD-10-CM | POA: Diagnosis not present

## 2016-06-19 DIAGNOSIS — G4733 Obstructive sleep apnea (adult) (pediatric): Secondary | ICD-10-CM

## 2016-06-19 DIAGNOSIS — J9611 Chronic respiratory failure with hypoxia: Secondary | ICD-10-CM | POA: Diagnosis present

## 2016-06-19 DIAGNOSIS — K219 Gastro-esophageal reflux disease without esophagitis: Secondary | ICD-10-CM | POA: Insufficient documentation

## 2016-06-19 DIAGNOSIS — Z9981 Dependence on supplemental oxygen: Secondary | ICD-10-CM | POA: Insufficient documentation

## 2016-06-19 DIAGNOSIS — D649 Anemia, unspecified: Secondary | ICD-10-CM | POA: Diagnosis present

## 2016-06-19 DIAGNOSIS — Z9989 Dependence on other enabling machines and devices: Secondary | ICD-10-CM | POA: Insufficient documentation

## 2016-06-19 DIAGNOSIS — L0390 Cellulitis, unspecified: Secondary | ICD-10-CM | POA: Diagnosis present

## 2016-06-19 DIAGNOSIS — L03115 Cellulitis of right lower limb: Secondary | ICD-10-CM

## 2016-06-19 DIAGNOSIS — M7989 Other specified soft tissue disorders: Secondary | ICD-10-CM | POA: Diagnosis present

## 2016-06-19 DIAGNOSIS — D869 Sarcoidosis, unspecified: Secondary | ICD-10-CM | POA: Diagnosis present

## 2016-06-19 DIAGNOSIS — J471 Bronchiectasis with (acute) exacerbation: Secondary | ICD-10-CM

## 2016-06-19 LAB — CBC
HCT: 36.8 % — ABNORMAL LOW (ref 39.0–52.0)
Hemoglobin: 11.9 g/dL — ABNORMAL LOW (ref 13.0–17.0)
MCH: 26.9 pg (ref 26.0–34.0)
MCHC: 32.3 g/dL (ref 30.0–36.0)
MCV: 83.3 fL (ref 78.0–100.0)
PLATELETS: 255 10*3/uL (ref 150–400)
RBC: 4.42 MIL/uL (ref 4.22–5.81)
RDW: 13.4 % (ref 11.5–15.5)
WBC: 11.9 10*3/uL — ABNORMAL HIGH (ref 4.0–10.5)

## 2016-06-19 LAB — BASIC METABOLIC PANEL
Anion gap: 8 (ref 5–15)
BUN: 15 mg/dL (ref 6–20)
CHLORIDE: 95 mmol/L — AB (ref 101–111)
CO2: 32 mmol/L (ref 22–32)
CREATININE: 1.12 mg/dL (ref 0.61–1.24)
Calcium: 9 mg/dL (ref 8.9–10.3)
GFR calc Af Amer: >60 mL/min (ref >60)
GFR calc non Af Amer: >60 mL/min (ref >60)
Glucose, Bld: 176 mg/dL — ABNORMAL HIGH (ref 65–99)
Potassium: 4.1 mmol/L (ref 3.5–5.1)
Sodium: 135 mmol/L (ref 135–145)

## 2016-06-19 NOTE — ED Triage Notes (Signed)
PT C/O BILATERAL LOWER LEG SWELLING WITH REDNESS SINCE THIS AM. PT STS HE WOKE UP TO THE SYMPTOMS. DENIES INJURY.

## 2016-06-20 ENCOUNTER — Other Ambulatory Visit: Payer: Self-pay

## 2016-06-20 ENCOUNTER — Encounter (HOSPITAL_COMMUNITY): Payer: Self-pay | Admitting: Family Medicine

## 2016-06-20 DIAGNOSIS — G4733 Obstructive sleep apnea (adult) (pediatric): Secondary | ICD-10-CM | POA: Diagnosis not present

## 2016-06-20 DIAGNOSIS — R739 Hyperglycemia, unspecified: Secondary | ICD-10-CM | POA: Diagnosis not present

## 2016-06-20 DIAGNOSIS — J471 Bronchiectasis with (acute) exacerbation: Secondary | ICD-10-CM | POA: Diagnosis not present

## 2016-06-20 DIAGNOSIS — J479 Bronchiectasis, uncomplicated: Secondary | ICD-10-CM

## 2016-06-20 DIAGNOSIS — L0390 Cellulitis, unspecified: Secondary | ICD-10-CM | POA: Diagnosis present

## 2016-06-20 DIAGNOSIS — L03115 Cellulitis of right lower limb: Secondary | ICD-10-CM

## 2016-06-20 DIAGNOSIS — J9611 Chronic respiratory failure with hypoxia: Secondary | ICD-10-CM | POA: Diagnosis not present

## 2016-06-20 DIAGNOSIS — L03116 Cellulitis of left lower limb: Secondary | ICD-10-CM | POA: Diagnosis not present

## 2016-06-20 DIAGNOSIS — D869 Sarcoidosis, unspecified: Secondary | ICD-10-CM | POA: Diagnosis not present

## 2016-06-20 DIAGNOSIS — I10 Essential (primary) hypertension: Secondary | ICD-10-CM

## 2016-06-20 DIAGNOSIS — L52 Erythema nodosum: Secondary | ICD-10-CM | POA: Diagnosis present

## 2016-06-20 DIAGNOSIS — Z9989 Dependence on other enabling machines and devices: Secondary | ICD-10-CM

## 2016-06-20 DIAGNOSIS — D649 Anemia, unspecified: Secondary | ICD-10-CM

## 2016-06-20 LAB — GLUCOSE, CAPILLARY
GLUCOSE-CAPILLARY: 141 mg/dL — AB (ref 65–99)
Glucose-Capillary: 148 mg/dL — ABNORMAL HIGH (ref 65–99)
Glucose-Capillary: 177 mg/dL — ABNORMAL HIGH (ref 65–99)
Glucose-Capillary: 367 mg/dL — ABNORMAL HIGH (ref 65–99)

## 2016-06-20 LAB — PROCALCITONIN

## 2016-06-20 LAB — D-DIMER, QUANTITATIVE: D-Dimer, Quant: 0.46 ug/mL-FEU (ref 0.00–0.50)

## 2016-06-20 LAB — SEDIMENTATION RATE: Sed Rate: 60 mm/hr — ABNORMAL HIGH (ref 0–16)

## 2016-06-20 LAB — CG4 I-STAT (LACTIC ACID): LACTIC ACID, VENOUS: 1.21 mmol/L (ref 0.5–1.9)

## 2016-06-20 LAB — BRAIN NATRIURETIC PEPTIDE: B Natriuretic Peptide: 32.2 pg/mL (ref 0.0–100.0)

## 2016-06-20 LAB — C-REACTIVE PROTEIN: CRP: 14.4 mg/dL — AB (ref <1.0)

## 2016-06-20 MED ORDER — CEPHALEXIN 500 MG PO CAPS
500.0000 mg | ORAL_CAPSULE | Freq: Four times a day (QID) | ORAL | Status: DC
  Administered 2016-06-20 – 2016-06-21 (×6): 500 mg via ORAL
  Filled 2016-06-20 (×6): qty 1

## 2016-06-20 MED ORDER — CALCIUM CARBONATE-VITAMIN D 500-200 MG-UNIT PO TABS
1.0000 | ORAL_TABLET | Freq: Every day | ORAL | Status: DC
  Administered 2016-06-20 – 2016-06-21 (×2): 1 via ORAL
  Filled 2016-06-20 (×2): qty 1

## 2016-06-20 MED ORDER — ENOXAPARIN SODIUM 40 MG/0.4ML ~~LOC~~ SOLN
40.0000 mg | SUBCUTANEOUS | Status: DC
  Administered 2016-06-20 – 2016-06-21 (×2): 40 mg via SUBCUTANEOUS
  Filled 2016-06-20 (×2): qty 0.4

## 2016-06-20 MED ORDER — CYCLOBENZAPRINE HCL 10 MG PO TABS: 10.0000 mg | ORAL_TABLET | Freq: Three times a day (TID) | ORAL | Status: DC | PRN

## 2016-06-20 MED ORDER — DIPHENHYDRAMINE HCL 25 MG PO CAPS: 25.0000 mg | ORAL_CAPSULE | Freq: Four times a day (QID) | ORAL | Status: DC | PRN

## 2016-06-20 MED ORDER — ONDANSETRON HCL 4 MG PO TABS: 4.0000 mg | ORAL_TABLET | Freq: Three times a day (TID) | ORAL | Status: DC | PRN

## 2016-06-20 MED ORDER — IPRATROPIUM-ALBUTEROL 0.5-2.5 (3) MG/3ML IN SOLN: 3.0000 mL | Freq: Four times a day (QID) | RESPIRATORY_TRACT | Status: DC

## 2016-06-20 MED ORDER — MOMETASONE FURO-FORMOTEROL FUM 200-5 MCG/ACT IN AERO
2.0000 | INHALATION_SPRAY | Freq: Two times a day (BID) | RESPIRATORY_TRACT | Status: DC
  Administered 2016-06-20 – 2016-06-21 (×3): 2 via RESPIRATORY_TRACT
  Filled 2016-06-20: qty 8.8

## 2016-06-20 MED ORDER — PANTOPRAZOLE SODIUM 40 MG PO TBEC
40.0000 mg | DELAYED_RELEASE_TABLET | Freq: Every day | ORAL | Status: DC
  Administered 2016-06-20 – 2016-06-21 (×2): 40 mg via ORAL
  Filled 2016-06-20 (×2): qty 1

## 2016-06-20 MED ORDER — INSULIN ASPART 100 UNIT/ML ~~LOC~~ SOLN
0.0000 [IU] | Freq: Three times a day (TID) | SUBCUTANEOUS | Status: DC
  Administered 2016-06-20 (×2): 1 [IU] via SUBCUTANEOUS
  Administered 2016-06-20: 3 [IU] via SUBCUTANEOUS
  Administered 2016-06-21 (×2): 2 [IU] via SUBCUTANEOUS

## 2016-06-20 MED ORDER — VANCOMYCIN HCL IN DEXTROSE 1-5 GM/200ML-% IV SOLN
1000.0000 mg | Freq: Once | INTRAVENOUS | Status: AC
Start: 2016-06-20 — End: 2016-06-20
  Administered 2016-06-20: 1000 mg via INTRAVENOUS
  Filled 2016-06-20: qty 200

## 2016-06-20 MED ORDER — DILTIAZEM HCL ER COATED BEADS 120 MG PO CP24
120.0000 mg | ORAL_CAPSULE | Freq: Every day | ORAL | Status: DC
  Administered 2016-06-20 – 2016-06-21 (×2): 120 mg via ORAL
  Filled 2016-06-20 (×2): qty 1

## 2016-06-20 MED ORDER — IPRATROPIUM-ALBUTEROL 0.5-2.5 (3) MG/3ML IN SOLN
3.0000 mL | RESPIRATORY_TRACT | Status: DC | PRN
  Administered 2016-06-20: 3 mL via RESPIRATORY_TRACT
  Filled 2016-06-20: qty 3

## 2016-06-20 MED ORDER — KETOROLAC TROMETHAMINE 30 MG/ML IJ SOLN
30.0000 mg | Freq: Four times a day (QID) | INTRAMUSCULAR | Status: DC | PRN
  Administered 2016-06-20 – 2016-06-21 (×2): 30 mg via INTRAVENOUS
  Filled 2016-06-20 (×2): qty 1

## 2016-06-20 MED ORDER — BISACODYL 5 MG PO TBEC: 5.0000 mg | DELAYED_RELEASE_TABLET | Freq: Every day | ORAL | Status: DC | PRN

## 2016-06-20 MED ORDER — IBUPROFEN 200 MG PO TABS
400.0000 mg | ORAL_TABLET | ORAL | Status: DC | PRN
  Administered 2016-06-20: 600 mg via ORAL
  Filled 2016-06-20: qty 3

## 2016-06-20 MED ORDER — METHYLPREDNISOLONE SODIUM SUCC 40 MG IJ SOLR
40.0000 mg | Freq: Every day | INTRAMUSCULAR | Status: DC
  Administered 2016-06-20 – 2016-06-21 (×2): 40 mg via INTRAVENOUS
  Filled 2016-06-20 (×2): qty 1

## 2016-06-20 MED ORDER — AYR SALINE NASAL NA GEL: 1.0000 "application " | NASAL | Status: DC | PRN

## 2016-06-20 MED ORDER — FERROUS SULFATE 325 (65 FE) MG PO TABS
325.0000 mg | ORAL_TABLET | Freq: Two times a day (BID) | ORAL | Status: DC
  Administered 2016-06-20 – 2016-06-21 (×3): 325 mg via ORAL
  Filled 2016-06-20 (×3): qty 1

## 2016-06-20 MED ORDER — SODIUM CHLORIDE 0.9 % IV SOLN: 250.0000 mL | INTRAVENOUS | Status: DC | PRN

## 2016-06-20 MED ORDER — POLYETHYLENE GLYCOL 3350 17 G PO PACK: 17.0000 g | PACK | Freq: Every day | ORAL | Status: DC | PRN

## 2016-06-20 MED ORDER — INSULIN ASPART 100 UNIT/ML ~~LOC~~ SOLN
6.0000 [IU] | Freq: Once | SUBCUTANEOUS | Status: AC
  Administered 2016-06-20: 6 [IU] via SUBCUTANEOUS

## 2016-06-20 MED ORDER — ADULT MULTIVITAMIN W/MINERALS CH
1.0000 | ORAL_TABLET | Freq: Every morning | ORAL | Status: DC
  Administered 2016-06-20 – 2016-06-21 (×2): 1 via ORAL
  Filled 2016-06-20 (×2): qty 1

## 2016-06-20 MED ORDER — VANCOMYCIN HCL IN DEXTROSE 750-5 MG/150ML-% IV SOLN: 750.0000 mg | Freq: Two times a day (BID) | INTRAVENOUS | Status: DC

## 2016-06-20 MED ORDER — CHLORTHALIDONE 25 MG PO TABS
25.0000 mg | ORAL_TABLET | Freq: Every day | ORAL | Status: DC
  Administered 2016-06-20 – 2016-06-21 (×2): 25 mg via ORAL
  Filled 2016-06-20 (×2): qty 1

## 2016-06-20 MED ORDER — GUAIFENESIN ER 600 MG PO TB12
600.0000 mg | ORAL_TABLET | Freq: Two times a day (BID) | ORAL | Status: DC | PRN
  Administered 2016-06-21: 600 mg via ORAL
  Filled 2016-06-20: qty 1

## 2016-06-20 MED ORDER — SODIUM CHLORIDE 0.9% FLUSH
3.0000 mL | Freq: Two times a day (BID) | INTRAVENOUS | Status: DC
  Administered 2016-06-20 – 2016-06-21 (×3): 3 mL via INTRAVENOUS

## 2016-06-20 MED ORDER — IPRATROPIUM-ALBUTEROL 0.5-2.5 (3) MG/3ML IN SOLN
3.0000 mL | Freq: Three times a day (TID) | RESPIRATORY_TRACT | Status: DC
  Administered 2016-06-20 – 2016-06-21 (×2): 3 mL via RESPIRATORY_TRACT
  Filled 2016-06-20 (×2): qty 3

## 2016-06-20 MED ORDER — SODIUM CHLORIDE 0.9% FLUSH: 3.0000 mL | INTRAVENOUS | Status: DC | PRN

## 2016-06-20 NOTE — ED Provider Notes (Signed)
Patient reports he started getting redness and swelling and pain in his lower extremities today. He denies any known trauma or injury. He states he's never had this before. Patient has COPD and is on home oxygen.  On exam patient's noted to have diffuse redness of both lower extremities, left worse than the right, and on the left it's more involved on the lateral aspect of his left lower leg and extends down to the lateral aspect of his foot. When I inspect his feet there is no obvious lesions on the sole of his feet or between his digits. The reddened skin is warm to touch and very painful to even light touch.       Patient appears to have an acute cellulitis of both of his lower extremities, he may have some underlying chronic venous stasis due to his underlying lung disease.  Medical screening examination/treatment/procedure(s) were conducted as a shared visit with non-physician practitioner(s) and myself.  I personally evaluated the patient during the encounter.   EKG Interpretation None       Devoria AlbeIva Amiracle Neises, MD, Concha PyoFACEP       Rodricus Candelaria, MD 06/20/16 [email or web address] [id number]

## 2016-06-20 NOTE — H&P (Signed)
History and Physical    Jamale Spangler ZOX:[id number] DOB: May 10, 1965 DOA: 06/19/2016  PCP: [facility]   Patient coming from: Home  Chief Complaint: Pain, redness, and swelling to bilateral lower legs  HPI: Xavion Muscat is a 51 y.o. 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 Patient reports that he been in his usual state of health until, upon waking yesterday morning, noted pain in the bilateral lower legs with associated redness and swelling. Patient describes the pain as severe, sharp and burning, localized to the bilateral lower legs with left worse than right, much worse with movement or weightbearing, and with no alleviating factors identified. Patient has never experienced similar symptoms previously. He reports that his breathing has been stable and denies any fevers, chills, worsening in his chronic cough or dyspnea, chest pain, or palpitations. Patient denies any abdominal pain, nausea, vomiting, or diarrhea. He has not attempted any interventions for the symptoms prior to coming in.  ED Course: Upon arrival to the ED, patient is found to be afebrile, saturating well on his usual 4 L/m supplemental oxygen, mildly tachycardic, and with vitals otherwise stable. Chest x-ray demonstrates chronic emphysematous and bullous changes with a slight increase in his pulmonary vascular congestion since the prior study. Chemistry panels notable for a glucose of 176 and CBC features a mild chronic leukocytosis with WBC 11,900 and a normocytic anemia with hemoglobin of 11.9. Lactic acid is reassuring at 1.21 and BNP is within the normal limits. Patient was started on empiric vancomycin in the emergency department for suspected cellulitis. He  remained slightly tachycardic and has not been in any acute respiratory distress. He will be observed on the medical/surgical unit for ongoing evaluation and management of pain, redness, and swelling involving the bilateral lower legs concerning for an inflammatory panniculitis such as erythema nodosum, versus possible mild nonpurulent cellulitis.  Review of Systems:  All other systems reviewed and apart from HPI, are negative.  Past Medical History:  Diagnosis Date  . Bronchitis   . CHF (congestive heart failure) (HCC)   . Chronic respiratory failure (HCC)    home O@ 4L (01/28/14)  . Chronic respiratory failure (HCC)   . COPD (chronic obstructive pulmonary disease) (HCC)   . GERD (gastroesophageal reflux disease)   . Hypertension   . Neuromuscular disorder (HCC)   . OSA on CPAP   . Pneumonia   . Sarcoidosis     Past Surgical History:  Procedure Laterality Date  . arm surgery     . ROTATOR CUFF REPAIR    . VIDEO ASSISTED THORACOSCOPY (VATS)/THOROCOTOMY Left 07/17/2012   Procedure: VIDEO ASSISTED THORACOSCOPY (VATS)/BLEB Stapling;  Surgeon: Alleen Borne, MD;  Location: [facility] OR;  Service: Thoracic;  Laterality: Left;     reports that he has never smoked. He has never used smokeless tobacco. He reports that he drinks alcohol. He reports that he does not use drugs.  Allergies  Allergen Reactions  . Clindamycin/Lincomycin Anaphylaxis and Swelling    Made face and tongue swell  . Clindamycin/Lincomycin Swelling    Facial and tongue swelling    Family History  Problem Relation Age of Onset  . Diabetes Father      Prior to Admission medications   Medication Sig Start Date End Date Taking? Authorizing Provider  acetaminophen (TYLENOL) 325  MG tablet Take 2 tablets (650 mg total) by mouth every 6 (six) hours as needed for mild pain (or Fever >/= 101). 04/19/16  Yes Alison Murray, MD  albuterol (PROVENTIL HFA;VENTOLIN HFA) 108 (90 Base) MCG/ACT inhaler Inhale 2 puffs into the lungs  every 6 (six) hours as needed for wheezing or shortness of breath. 04/19/16  Yes Alison Murray, MD  bisacodyl (DULCOLAX) 5 MG EC tablet Take 5 mg by mouth daily as needed for moderate constipation.   Yes [provider]  calcium-vitamin D (OSCAL WITH D) 500-200 MG-UNIT tablet Take 1 tablet by mouth daily.   Yes [provider]  CARTIA XT 120 MG 24 hr capsule Take 120 mg by mouth every morning. 04/16/16  Yes [provider]  chlorthalidone (HYGROTON) 25 MG tablet Take 25 mg by mouth daily. 06/17/16  Yes [provider]  cyclobenzaprine (FLEXERIL) 10 MG tablet Take 10 mg by mouth 3 (three) times daily as needed for muscle spasms.   Yes [provider]  diphenhydrAMINE (BENADRYL) 25 MG tablet Take 25 mg by mouth every 6 (six) hours as needed for itching or allergies.   Yes [provider]  Ergocalciferol (VITAMIN D2) 400 units TABS Take 400 Units by mouth daily. 03/18/08  Yes [provider]  ferrous sulfate 325 (65 FE) MG tablet Take 325 mg by mouth 2 (two) times daily with a meal.   Yes [provider]  guaiFENesin (MUCINEX) 600 MG 12 hr tablet Take 600 mg by mouth 2 (two) times daily as needed for cough or to loosen phlegm.   Yes [provider]  ipratropium-albuterol (DUONEB) 0.5-2.5 (3) MG/3ML SOLN Take 3 mLs by nebulization every 4 (four) hours as needed. Patient taking differently: Take 3 mLs by nebulization every 4 (four) hours as needed (SOB, wheezing).  04/19/16  Yes Alison Murray, MD  Multiple Vitamins-Minerals (MULTIVITAMIN WITH MINERALS) tablet Take 1 tablet by mouth every morning.    Yes [provider]  omeprazole (PRILOSEC) 40 MG capsule Take 40 mg by mouth 2 (two) times daily.   Yes [provider]  ondansetron (ZOFRAN) 4 MG tablet Take 4 mg by mouth every 8 (eight) hours as needed for nausea or vomiting.   Yes [provider]  OXYGEN Inhale 4 L into the lungs continuous.   Yes [provider]  polyethylene glycol (MIRALAX / GLYCOLAX) packet Take 17 g by mouth daily as needed (constipation).   Yes [provider]  saline (AYR) GEL Place 1 application into the nose every 4 (four) hours as needed (dryness/irritation).   Yes [provider]  SYMBICORT 160-4.5 MCG/ACT inhaler Inhale 1 puff into the lungs 2 (two) times daily. 04/19/16  Yes Alison Murray, MD  predniSONE (DELTASONE) 20 MG tablet Take 1 tablet (20 mg total) by mouth daily with breakfast. Take 2 tablets twice daily for 3 days, then 1 tablet twice daily for 3 days, then 1 tablet daily for 3 days, then half tablet daily for 3 days. Patient not taking: Reported on 06/20/2016 05/07/16   Arrien, York Ram, MD    Physical Exam: Vitals:   06/20/16 0130 06/20/16 0200 06/20/16 0230 06/20/16 0300  BP: (!) 135/98 (!) 139/102 128/85 115/90  Pulse: (!) 103 (!) 103 (!) 108 (!) 107  Resp:  20    Temp:      TempSrc:      SpO2: 100% 100% 100% 99%  Weight:      Height:  Constitutional: NAD, calm, in obvious discomfort.  Eyes: PERTLA, lids and conjunctivae normal ENMT: Mucous membranes are moist. Posterior pharynx clear of any exudate or lesions.   Neck: normal, supple, no masses, no thyromegaly Respiratory: Breath sounds diminished bilaterally. Fine rales diffusely, no wheezing, no rhonchi. No accessory muscle use.  Cardiovascular: Rate ~110 and regular. 1+ ankle edema bilaterally. No significant JVD. Abdomen: No distension, no tenderness, no masses palpated. Bowel sounds normal.  Musculoskeletal: no clubbing / cyanosis. No joint deformity upper and lower extremities.   Skin: Intense erythema and exquisite tenderness involving bilateral lower legs, mainly left anterior shin and right posterior aspect lower leg. No fluctuance or drainage. Skin is otherwise warm, dry, well-perfused. Neurologic: CN 2-12 grossly intact. Sensation intact, DTR normal. Strength 5/5 in all 4 limbs.  Psychiatric:  Alert and oriented x 3. Pleasant and cooperative.     Labs on Admission: I have personally reviewed following labs and imaging studies  CBC:  Recent Labs Lab 06/19/16 2133  WBC 11.9*  HGB 11.9*  HCT 36.8*  MCV 83.3  PLT 255   Basic Metabolic Panel:  Recent Labs Lab 06/19/16 2133  NA 135  K 4.1  CL 95*  CO2 32  GLUCOSE 176*  BUN 15  CREATININE 1.12  CALCIUM 9.0   GFR: Estimated Creatinine Clearance: 73 mL/min (by C-G formula based on SCr of 1.12 mg/dL). Liver Function Tests: No results for input(s): AST, ALT, ALKPHOS, BILITOT, PROT, ALBUMIN in the last 168 hours. No results for input(s): LIPASE, AMYLASE in the last 168 hours. No results for input(s): AMMONIA in the last 168 hours. Coagulation Profile: No results for input(s): INR, PROTIME in the last 168 hours. Cardiac Enzymes: No results for input(s): CKTOTAL, CKMB, CKMBINDEX, TROPONINI in the last 168 hours. BNP (last 3 results) No results for input(s): PROBNP in the last 8760 hours. HbA1C: No results for input(s): HGBA1C in the last 72 hours. CBG: No results for input(s): GLUCAP in the last 168 hours. Lipid Profile: No results for input(s): CHOL, HDL, LDLCALC, TRIG, CHOLHDL, LDLDIRECT in the last 72 hours. Thyroid Function Tests: No results for input(s): TSH, T4TOTAL, FREET4, T3FREE, THYROIDAB in the last 72 hours. Anemia Panel: No results for input(s): VITAMINB12, FOLATE, FERRITIN, TIBC, IRON, RETICCTPCT in the last 72 hours. Urine analysis:    Component Value Date/Time   COLORURINE YELLOW 01/09/2014 1843   APPEARANCEUR CLEAR 01/09/2014 1843   LABSPEC 1.023 01/09/2014 1843   PHURINE 5.5 01/09/2014 1843   GLUCOSEU 250 (A) 01/09/2014 1843   HGBUR NEGATIVE 01/09/2014 1843   BILIRUBINUR NEGATIVE 01/09/2014 1843   KETONESUR NEGATIVE 01/09/2014 1843   PROTEINUR NEGATIVE 01/09/2014 1843   UROBILINOGEN 0.2 01/09/2014 1843   NITRITE NEGATIVE 01/09/2014 1843   LEUKOCYTESUR NEGATIVE 01/09/2014 1843    Sepsis Labs: @LABRCNTIP (procalcitonin:4,lacticidven:4) )No results found for this or any previous visit (from the past 240 hour(s)).   Radiological Exams on Admission: Dg Chest 2 View  Result Date: 06/19/2016 CLINICAL DATA:  Bilateral lower leg swelling and erythema. History of sarcoid, COPD. EXAM: CHEST  2 VIEW COMPARISON:  05/05/2016 CT, 05/04/2016 CXR FINDINGS: The heart size and mediastinal contours are within normal limits. Bullous emphysematous changes are seen in the upper lobes right greater than left with chronic pleuroparenchymal scarring about the hila bilaterally. There does appear to be more vascular congestion on current exam without pneumonic consolidation, effusion or pneumothorax. The visualized skeletal structures are unremarkable. IMPRESSION: 1. Upper lobe predominant emphysematous disease with bullous changes right greater than left. 2.  Slight interval increase in vascular congestion since previous exam. 3. Chronic pleuroparenchymal scarring in the midlung bilaterally. Electronically Signed   By: Tollie Ethavid  Kwon M.D.   On: 06/19/2016 21:08    EKG: Ordered and pending.   Assessment/Plan  1. Erythema nodosum, working diagnosis   - Pt presents with acute-onset erythema, edema, and severe pain involving bilaterally lower legs  - He is afebrile; mild leukocytosis noted with normal lactic acid  - He was treated with empiric vancomycin in ED  - Suspect this is an inflammatory paniclulitis (erythema nodosum); mild non-purulent cellulitis possible, but simultaneous involvement of both legs would be unusual  - Plan to treat with leg elevation and NSAID's; given the rapid development, pt's significant comorbidity, and difficulty excluding infectious process, will continue Ancef empirically for a possibly mild non-purulent cellulitis  - Check inflammatory markers, procalcitonin  - Elevate legs, prn Advil q4h      2. Sarcoidosis, bronchiectasis with chronic hypoxic respiratory failure -  Pt with pulmonary sarcoidosis, dependent on 4 Lpm supplemental O2 atc  - This appears to be stable  - Continue scheduled ICS/LABA and prn DuoNeb    3. Chronic diastolic CHF - Increased congestion noted on CXR and mild bilateral ankle edema present, though possibly related to #1 - His respiratory status is at baseline, JVP is wnl, and BNP low  - TTE (01/10/14) with EF 60-65%, grade 1 diastolic dysfunction, no WMA's, mild pulmonic regurgitation - Continue chlorthalidone, SLIV, follow I/O's and daily wts   4. Hypertension  - BP at goal  - Continue chlorthalidone and diltiazem    5. Hyperglycemia  - Serum glucose 176 on admission  - A1c was 6.4% in April 2016  - Not currently on systemic steroids - Check CBG with meals and qHS, update A1c, start a low-intensity correctional insulin    6. Iron-deficiency anemia  - Hgb is 11.9 on admission, slightly lower than priors, no evidence for bleeding  - Continue iron-supplementation   7. OSA on CPAP - CPAP qHS    DVT prophylaxis: sq Lovenox  Code Status: Full  Family Communication: Discussed with patient Disposition Plan: Observe on med-surg Consults called: None Admission status: Observation    Briscoe Deutscherimothy S Yaffa Seckman, MD [facility] Pager [phone]  If 7PM-7AM, please contact night-coverage [email or web address] Password [email or web address]  06/20/2016, 5:21 AM

## 2016-06-20 NOTE — Progress Notes (Signed)
PROGRESS NOTE    [name]  FUX:[id number] DOB: 1965/12/29 DOA: 06/19/2016 PCP: [facility]    Brief Narrative:  51 y.o. 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 Patient reports that he been in his usual state of health until, upon waking yesterday morning, noted pain in the bilateral lower legs with associated redness and swelling. Patient describes the pain as severe, sharp and burning, localized to the bilateral lower legs with left worse than right, much worse with movement or weightbearing, and with no alleviating factors identified. Patient has never experienced similar symptoms previously. He reports that his breathing has been stable and denies any fevers, chills, worsening in his chronic cough or dyspnea, chest pain, or palpitations. Patient denies any abdominal pain, nausea, vomiting, or diarrhea. He has not attempted any interventions for the symptoms prior to coming in.  Assessment & Plan:   Principal Problem:   Erythema nodosum, acute form Active Problems:   Sarcoid (HCC)   OSA on CPAP   Hyperglycemia   Bronchiectasis without acute exacerbation (HCC)   Normocytic anemia   Chronic respiratory failure with hypoxia (HCC)   Essential hypertension   Hypertension   Cellulitis  1. Erythema nodosum, working diagnosis   - Pt presented with acute-onset erythema, edema, and severe pain involving bilaterally lower legs  - He is afebrile; mild leukocytosis noted with normal lactic acid  - He was treated with empiric vancomycin in ED  - Suspected inflammatory paniclulitis (erythema nodosum);  - Patient is continued on empiric antibiotics for possible mild non-purulent cellulitis - Plan to treat with leg elevation  and NSAID's -Patient reports recently finishing prednisone taper for history of sarcoidosis -We'll give trial of IV Solu-Medrol. Labs reviewed, ESR elevated at 60  2. Sarcoidosis, bronchiectasis with chronic hypoxic respiratory failure - Pt with pulmonary sarcoidosis, dependent on 4 Lpm supplemental O2 atc  - This appears to be stable  - Continue scheduled ICS/LABA and prn DuoNeb   -Patient reportedly finished prednisone taper recently  3. Chronic diastolic CHF - Increased congestion noted on CXR and mild bilateral ankle edema present, though possibly related to #1 - His respiratory status is at baseline, JVP is wnl, and BNP low  - TTE (01/10/14) with EF 02-54%, grade 1 diastolic dysfunction, no WMA's, mild pulmonic regurgitation - Continue chlorthalidone, SLIV, follow I/O's and daily wts  -Appear stable at this time  4. Hypertension  - BP remains at goal  - Continue chlorthalidone and diltiazem as tolerated  5. Hyperglycemia  - Serum glucose 176 on admission  - A1c was 6.4% in April 2016  - Not currently on systemic steroids - Check CBG with meals and qHS, update A1c -Continue side scale insulin as needed  6. Iron-deficiency anemia  - Hgb is 11.9 on admission, slightly lower than priors, no evidence for bleeding  - Continue iron-supplementation as tolerated  7. OSA on CPAP - CPAP qHS  -Stable at present  DVT prophylaxis: Lovenox subcutaneous Code Status: Full code Family Communication: Patient in room, family not at bedside Disposition Plan: Uncertain at this time  Consultants:     Procedures:     Antimicrobials: Anti-infectives    Start     Dose/Rate Route Frequency Ordered Stop   06/20/16 1800  vancomycin (VANCOCIN) IVPB 750 mg/150 ml premix  Status:  Discontinued     750 mg 150 mL/hr over 60 Minutes Intravenous Every 12 hours 06/20/16 0250 06/20/16 0521   06/20/16 0600  cephALEXin (KEFLEX) capsule 500 mg     500 mg Oral Every 6 hours 06/20/16 0521  06/25/16 0559   06/20/16 0200  vancomycin (VANCOCIN) IVPB 1000 mg/200 mL premix     1,000 mg 200 mL/hr over 60 Minutes Intravenous  Once 06/20/16 0157 06/20/16 0329       Subjective: Still complaining of bilateral lower leg pain and redness, somewhat improved  Objective: Vitals:   06/20/16 0600 06/20/16 0640 06/20/16 1217 06/20/16 1318  BP: (!) 121/96 (!) 145/87  123/82  Pulse:  (!) 119  71  Resp:  (!) 22  (!) 21  Temp:  99 F (37.2 C)  98.1 F (36.7 C)  TempSrc:  Oral  Oral  SpO2:  100% 97% 96%  Weight:  75.6 kg (166 lb 11.2 oz)    Height:  5' 7"  (1.702 m)      Intake/Output Summary (Last 24 hours) at 06/20/16 1523 Last data filed at 06/20/16 1200  Gross per 24 hour  Intake              560 ml  Output              700 ml  Net             -140 ml   Filed Weights   06/19/16 2041 06/20/16 0640  Weight: 73.5 kg (162 lb) 75.6 kg (166 lb 11.2 oz)    Examination:  General exam: Appears calm and comfortable  Respiratory system: Clear to auscultation. Respiratory effort normal. Cardiovascular system: S1 & S2 heard, RRR. No JVD, murmurs, rubs, gallops or clicks. No pedal edema. Gastrointestinal system: Abdomen is nondistended, soft and nontender. No organomegaly or masses felt. Normal bowel sounds heard. Central nervous system: Alert and oriented. No focal neurological deficits. Extremities: Symmetric 5 x 5 power. Skin: Patches of tender erythema over bilateral shins worse over left lower extremity drainage, tender to palpation Psychiatry: Judgement and insight appear normal. Mood & affect appropriate.   Data Reviewed: I have personally reviewed following labs and imaging studies  CBC:  Recent Labs Lab 06/19/16 2133  WBC 11.9*  HGB 11.9*  HCT 36.8*  MCV 83.3  PLT 329   Basic Metabolic Panel:  Recent Labs Lab 06/19/16 2133  NA 135  K 4.1  CL 95*  CO2 32  GLUCOSE 176*  BUN 15  CREATININE 1.12  CALCIUM 9.0   GFR: Estimated Creatinine Clearance: 73  mL/min (by C-G formula based on SCr of 1.12 mg/dL). Liver Function Tests: No results for input(s): AST, ALT, ALKPHOS, BILITOT, PROT, ALBUMIN in the last 168 hours. No results for input(s): LIPASE, AMYLASE in the last 168 hours. No results for input(s): AMMONIA in the last 168 hours. Coagulation Profile: No results for input(s): INR, PROTIME in the last 168 hours. Cardiac Enzymes: No results for input(s): CKTOTAL, CKMB, CKMBINDEX, TROPONINI in the last 168 hours. BNP (last 3 results) No results for input(s): PROBNP in the last 8760 hours. HbA1C: No results for input(s): HGBA1C in the last 72 hours. CBG:  Recent Labs Lab 06/20/16 0745 06/20/16 1147  GLUCAP 148* 177*   Lipid Profile: No results for input(s): CHOL, HDL, LDLCALC, TRIG, CHOLHDL, LDLDIRECT in the last 72 hours. Thyroid Function Tests: No results for input(s): TSH, T4TOTAL, FREET4, T3FREE, THYROIDAB in the last 72 hours. Anemia Panel: No results for input(s):  VITAMINB12, FOLATE, FERRITIN, TIBC, IRON, RETICCTPCT in the last 72 hours. Sepsis Labs:  Recent Labs Lab 06/20/16 0100 06/20/16 0725  PROCALCITON  --  <0.10  LATICACIDVEN 1.21  --     No results found for this or any previous visit (from the past 240 hour(s)).   Radiology Studies: Dg Chest 2 View  Result Date: 06/19/2016 CLINICAL DATA:  Bilateral lower leg swelling and erythema. History of sarcoid, COPD. EXAM: CHEST  2 VIEW COMPARISON:  05/05/2016 CT, 05/04/2016 CXR FINDINGS: The heart size and mediastinal contours are within normal limits. Bullous emphysematous changes are seen in the upper lobes right greater than left with chronic pleuroparenchymal scarring about the hila bilaterally. There does appear to be more vascular congestion on current exam without pneumonic consolidation, effusion or pneumothorax. The visualized skeletal structures are unremarkable. IMPRESSION: 1. Upper lobe predominant emphysematous disease with bullous changes right greater than  left. 2. Slight interval increase in vascular congestion since previous exam. 3. Chronic pleuroparenchymal scarring in the midlung bilaterally. Electronically Signed   By: Ashley Royalty M.D.   On: 06/19/2016 21:08    Scheduled Meds: . calcium-vitamin D  1 tablet Oral Daily  . cephALEXin  500 mg Oral Q6H  . chlorthalidone  25 mg Oral Daily  . diltiazem  120 mg Oral Daily  . enoxaparin (LOVENOX) injection  40 mg Subcutaneous Q24H  . ferrous sulfate  325 mg Oral BID WC  . insulin aspart  0-9 Units Subcutaneous TID WC  . methylPREDNISolone (SOLU-MEDROL) injection  40 mg Intravenous Daily  . mometasone-formoterol  2 puff Inhalation BID  . multivitamin with minerals  1 tablet Oral q morning - 10a  . pantoprazole  40 mg Oral Daily  . sodium chloride flush  3 mL Intravenous Q12H   Continuous Infusions: . sodium chloride       LOS: 0 days   Starleen Trussell, Orpah Melter, MD [facility] Pager [phone]  If 7PM-7AM, please contact night-coverage [email or web address] Password [email or web address] 06/20/2016, 3:23 PM

## 2016-06-20 NOTE — Progress Notes (Signed)
Pharmacy Antibiotic Note  Andris Baumannommy Privette is a 51 y.o. male admitted on 06/19/2016 with cellulitis.  Pharmacy has been consulted for Vancomycin dosing.  Plan: Vancomycin 750mg  IV every 12 hours.  Goal trough 10-15 mcg/mL.  Height: 5\' 7"  (170.2 cm) Weight: 162 lb (73.5 kg) IBW/kg (Calculated) : 66.1  Temp (24hrs), Avg:98.6 F (37 C), Min:98.1 F (36.7 C), Max:99.1 F (37.3 C)   Recent Labs Lab 06/19/16 2133 06/20/16 0100  WBC 11.9*  --   CREATININE 1.12  --   LATICACIDVEN  --  1.21    Estimated Creatinine Clearance: 73 mL/min (by C-G formula based on SCr of 1.12 mg/dL).    Allergies  Allergen Reactions  . Clindamycin/Lincomycin Anaphylaxis and Swelling    Made face and tongue swell  . Clindamycin/Lincomycin Swelling    Facial and tongue swelling    Antimicrobials this admission: Vancomycin 06/20/2016 >>   Dose adjustments this admission: -  Microbiology results: pending  Thank you for allowing pharmacy to be a part of this patient's care.  Aleene DavidsonGrimsley Jr, Shonna Deiter Crowford 06/20/2016 2:51 AM

## 2016-06-20 NOTE — Procedures (Signed)
CPAP is beside and ready for use.  Patient stated he will place on himself when ready for bed.

## 2016-06-20 NOTE — ED Provider Notes (Signed)
[facility]-EMERGENCY DEPT Provider Note   CSN: [id number] Arrival date & time: 06/19/16  2001     History   Chief Complaint Chief Complaint  Patient presents with  . Leg Swelling    BILATERAL    HPI [name] is a 51 y.o. male.  HPI Patient presents to the emergency department with lower leg pain and redness.  Patient states that he noticed earlier this morning that he had redness to his lower extremity.  It was worse on the left than the right, but noticed that there was some redness on the right as well.  Patient states that he does not recall any injuries or wounds to his legs or feet.  Patient states that nothing seems make the condition better, but palpation makes the pain worse.  He states that he had pain with walking as well.  Patient states that he did not take any medications prior to arrival.  [facility] Past Medical History:  Diagnosis Date  . Bronchitis   . CHF (congestive heart failure) (HCC)   . Chronic respiratory failure (HCC)    home O@ 4L (01/28/14)  . Chronic respiratory failure (HCC)   . COPD (chronic obstructive pulmonary disease) (HCC)   . GERD (gastroesophageal reflux disease)   . Hypertension   . Neuromuscular disorder (HCC)   . OSA on CPAP   . Pneumonia   . Sarcoidosis     Patient Active Problem List   Diagnosis Date Noted  . Acute respiratory failure with hypoxia (HCC) 04/16/2016  . Sarcoidosis 01/13/2016  . Hypertension 01/13/2016  . Chronic respiratory failure (HCC) 01/13/2016  . Cocaine abuse 01/13/2016  . Respiratory distress 01/12/2016  . Epistaxis 09/19/2015  . Hyperkalemia   . COPD exacerbation (HCC) 07/21/2015  . HCAP (healthcare-associated pneumonia) 01/13/2015  . SOB (shortness of breath) 03/23/2014  . Essential hypertension   . Chronic respiratory failure with hypoxia (HCC) 02/11/2014  . Neuropathy 01/26/2014  . Abnormal bruising 01/23/2014  . Foot pain, bilateral 01/23/2014  . Discoloration of skin of toe 01/23/2014  . Acute on  chronic respiratory failure with hypoxia (HCC) 01/17/2014  . Chronic pain 11/23/2013  . Abdominal pain, epigastric 08/03/2012  . Nuclear sclerotic cataract 07/06/2012  . Normocytic anemia 06/16/2012  . GERD (gastroesophageal reflux disease) 06/16/2012  . Bronchiectasis without acute exacerbation (HCC) 03/05/2012  . Hyperglycemia, drug-induced 07/27/2011  . Error, refractive, myopia 02/15/2011  . Sarcoid (HCC) 01/15/2011  . OSA on CPAP 01/15/2011    Past Surgical History:  Procedure Laterality Date  . arm surgery     . ROTATOR CUFF REPAIR    . VIDEO ASSISTED THORACOSCOPY (VATS)/THOROCOTOMY Left 07/17/2012   Procedure: VIDEO ASSISTED THORACOSCOPY (VATS)/BLEB Stapling;  Surgeon: Alleen BorneBryan K Bartle, MD;  Location: [facility] OR;  Service: Thoracic;  Laterality: Left;       Home Medications    Prior to Admission medications   Medication Sig Start Date End Date Taking? Authorizing Provider  acetaminophen (TYLENOL) 325 MG tablet Take 2 tablets (650 mg total) by mouth every 6 (six) hours as needed for mild pain (or Fever >/= 101). 04/19/16  Yes Alison Murrayevine, Alma M, MD  albuterol (PROVENTIL HFA;VENTOLIN HFA) 108 (90 Base) MCG/ACT inhaler Inhale 2 puffs into the lungs every 6 (six) hours as needed for wheezing or shortness of breath. 04/19/16  Yes Alison Murrayevine, Alma M, MD  bisacodyl (DULCOLAX) 5 MG EC tablet Take 5 mg by mouth daily as needed for moderate constipation.   Yes [provider]  calcium-vitamin D (Ruthell RummageSCAL WITH  D) 500-200 MG-UNIT tablet Take 1 tablet by mouth daily.   Yes [provider]  CARTIA XT 120 MG 24 hr capsule Take 120 mg by mouth every morning. 04/16/16  Yes [provider]  chlorthalidone (HYGROTON) 25 MG tablet Take 25 mg by mouth daily. 06/17/16  Yes [provider]  cyclobenzaprine (FLEXERIL) 10 MG tablet Take 10 mg by mouth 3 (three) times daily as needed for muscle spasms.   Yes [provider]  diphenhydrAMINE (BENADRYL) 25 MG tablet Take 25 mg by mouth  every 6 (six) hours as needed for itching or allergies.   Yes [provider]  Ergocalciferol (VITAMIN D2) 400 units TABS Take 400 Units by mouth daily. 03/18/08  Yes [provider]  ferrous sulfate 325 (65 FE) MG tablet Take 325 mg by mouth 2 (two) times daily with a meal.   Yes [provider]  guaiFENesin (MUCINEX) 600 MG 12 hr tablet Take 600 mg by mouth 2 (two) times daily as needed for cough or to loosen phlegm.   Yes [provider]  ipratropium-albuterol (DUONEB) 0.5-2.5 (3) MG/3ML SOLN Take 3 mLs by nebulization every 4 (four) hours as needed. Patient taking differently: Take 3 mLs by nebulization every 4 (four) hours as needed (SOB, wheezing).  04/19/16  Yes Alison Murray, MD  Multiple Vitamins-Minerals (MULTIVITAMIN WITH MINERALS) tablet Take 1 tablet by mouth every morning.    Yes [provider]  omeprazole (PRILOSEC) 40 MG capsule Take 40 mg by mouth 2 (two) times daily.   Yes [provider]  ondansetron (ZOFRAN) 4 MG tablet Take 4 mg by mouth every 8 (eight) hours as needed for nausea or vomiting.   Yes [provider]  OXYGEN Inhale 4 L into the lungs continuous.   Yes [provider]  polyethylene glycol (MIRALAX / GLYCOLAX) packet Take 17 g by mouth daily as needed (constipation).   Yes [provider]  saline (AYR) GEL Place 1 application into the nose every 4 (four) hours as needed (dryness/irritation).   Yes [provider]  SYMBICORT 160-4.5 MCG/ACT inhaler Inhale 1 puff into the lungs 2 (two) times daily. 04/19/16  Yes Alison Murray, MD  predniSONE (DELTASONE) 20 MG tablet Take 1 tablet (20 mg total) by mouth daily with breakfast. Take 2 tablets twice daily for 3 days, then 1 tablet twice daily for 3 days, then 1 tablet daily for 3 days, then half tablet daily for 3 days. Patient not taking: Reported on 06/20/2016 05/07/16   Arrien, York Ram, MD    Family History Family History    Problem Relation Age of Onset  . Diabetes Father     Social History Social History  Substance Use Topics  . Smoking status: Never Smoker  . Smokeless tobacco: Never Used  . Alcohol use Yes     Comment: social     Allergies   Clindamycin/lincomycin and Clindamycin/lincomycin   Review of Systems Review of Systems All other systems negative except as documented in the HPI. All pertinent positives and negatives as reviewed in the HPI.  Physical Exam Updated Vital Signs BP 115/90   Pulse (!) 107   Temp 98.1 F (36.7 C) (Oral)   Resp 20   Ht 5\' 7"  (1.702 m)   Wt 73.5 kg (162 lb)   SpO2 99%   BMI 25.37 kg/m   Physical Exam  Constitutional: He is oriented to person, place, and time. He appears well-developed and well-nourished. No distress.  HENT:  Head: Normocephalic and atraumatic.  Mouth/Throat: Oropharynx is clear and moist.  Eyes: Pupils are equal, round, and reactive to light.  Neck: Normal range of motion. Neck supple.  Cardiovascular: Normal rate, regular rhythm and normal heart sounds.  Exam reveals no gallop and no friction rub.   No murmur heard. Pulmonary/Chest: Effort normal and breath sounds normal. No respiratory distress. He has no wheezes.  Abdominal: Soft. Bowel sounds are normal. He exhibits no distension. There is no tenderness.  Musculoskeletal:       Legs: Neurological: He is alert and oriented to person, place, and time. He exhibits normal muscle tone. Coordination normal.  Skin: Skin is warm and dry. Capillary refill takes less than 2 seconds. No rash noted. No erythema.  Psychiatric: He has a normal mood and affect. His behavior is normal.  Nursing note and vitals reviewed.    ED Treatments / Results  Labs (all labs ordered are listed, but only abnormal results are displayed) Labs Reviewed  BASIC METABOLIC PANEL - Abnormal; Notable for the following:       Result Value   Chloride 95 (*)    Glucose, Bld 176 (*)    All other components  within normal limits  CBC - Abnormal; Notable for the following:    WBC 11.9 (*)    Hemoglobin 11.9 (*)    HCT 36.8 (*)    All other components within normal limits  BRAIN NATRIURETIC PEPTIDE  D-DIMER, QUANTITATIVE (NOT AT [facility])  I-STAT CG4 LACTIC ACID, ED  CG4 I-STAT (LACTIC ACID)    EKG  EKG Interpretation None       Radiology Dg Chest 2 View  Result Date: 06/19/2016 CLINICAL DATA:  Bilateral lower leg swelling and erythema. History of sarcoid, COPD. EXAM: CHEST  2 VIEW COMPARISON:  05/05/2016 CT, 05/04/2016 CXR FINDINGS: The heart size and mediastinal contours are within normal limits. Bullous emphysematous changes are seen in the upper lobes right greater than left with chronic pleuroparenchymal scarring about the hila bilaterally. There does appear to be more vascular congestion on current exam without pneumonic consolidation, effusion or pneumothorax. The visualized skeletal structures are unremarkable. IMPRESSION: 1. Upper lobe predominant emphysematous disease with bullous changes right greater than left. 2. Slight interval increase in vascular congestion since previous exam. 3. Chronic pleuroparenchymal scarring in the midlung bilaterally. Electronically Signed   By: Tollie Eth M.D.   On: 06/19/2016 21:08    Procedures Procedures (including critical care time)  Medications Ordered in ED Medications  vancomycin (VANCOCIN) IVPB 750 mg/150 ml premix (not administered)  vancomycin (VANCOCIN) IVPB 1000 mg/200 mL premix (0 mg Intravenous Stopped 06/20/16 0329)     Initial Impression / Assessment and Plan / ED Course  I have reviewed the triage vital signs and the nursing notes.  Pertinent labs & imaging results that were available during my care of the patient were reviewed by me and considered in my medical decision making (see chart for details).     Patient has what appears to be cellulitis of the lower extremities, worse on the left than the right.  I will give the  patient antibiotics and also seek admission for the cellulitis of both lower extremities.  Patient is on chronic oxygen and has multiple medical problems that could complicate his course.   Final Clinical Impressions(s) / ED Diagnoses   Final diagnoses:  Cellulitis of left anterior lower leg  Cellulitis of right lower extremity    New Prescriptions New Prescriptions  No medications on file     Charlestine Night, Cordelia Poche 06/20/16 1610    Devoria Albe, MD 06/20/16 [phone]

## 2016-06-21 DIAGNOSIS — L52 Erythema nodosum: Secondary | ICD-10-CM | POA: Diagnosis not present

## 2016-06-21 LAB — CBC
HCT: 31.6 % — ABNORMAL LOW (ref 39.0–52.0)
HEMOGLOBIN: 10.3 g/dL — AB (ref 13.0–17.0)
MCH: 26.9 pg (ref 26.0–34.0)
MCHC: 32.6 g/dL (ref 30.0–36.0)
MCV: 82.5 fL (ref 78.0–100.0)
PLATELETS: 236 10*3/uL (ref 150–400)
RBC: 3.83 MIL/uL — ABNORMAL LOW (ref 4.22–5.81)
RDW: 13 % (ref 11.5–15.5)
WBC: 13.8 10*3/uL — AB (ref 4.0–10.5)

## 2016-06-21 LAB — BASIC METABOLIC PANEL
ANION GAP: 10 (ref 5–15)
BUN: 17 mg/dL (ref 6–20)
CALCIUM: 9.1 mg/dL (ref 8.9–10.3)
CO2: 29 mmol/L (ref 22–32)
CREATININE: 0.89 mg/dL (ref 0.61–1.24)
Chloride: 95 mmol/L — ABNORMAL LOW (ref 101–111)
Glucose, Bld: 206 mg/dL — ABNORMAL HIGH (ref 65–99)
Potassium: 4.2 mmol/L (ref 3.5–5.1)
SODIUM: 134 mmol/L — AB (ref 135–145)

## 2016-06-21 LAB — HEMOGLOBIN A1C
HEMOGLOBIN A1C: 6.9 % — AB (ref 4.8–5.6)
MEAN PLASMA GLUCOSE: 151 mg/dL

## 2016-06-21 LAB — GLUCOSE, CAPILLARY
Glucose-Capillary: 175 mg/dL — ABNORMAL HIGH (ref 65–99)
Glucose-Capillary: 165 mg/dL — ABNORMAL HIGH (ref 65–99)

## 2016-06-21 MED ORDER — AMITRIPTYLINE HCL 25 MG PO TABS: 25.0000 mg | ORAL_TABLET | Freq: Every day | ORAL | 0 refills | Status: DC

## 2016-06-21 MED ORDER — PREDNISONE 10 MG PO TABS: ORAL_TABLET | ORAL | 0 refills | Status: DC

## 2016-06-21 MED ORDER — CEPHALEXIN 500 MG PO CAPS: 500.0000 mg | ORAL_CAPSULE | Freq: Four times a day (QID) | ORAL | 0 refills | Status: AC

## 2016-06-21 NOTE — Evaluation (Signed)
Occupational Therapy Evaluation Patient Details Name: [name] MRN: [id number] DOB: [birth year] Today's Date: 06/21/2016    History of Present Illness 51 y.o. male with medical history significant for pulmonary sarcoidosis with chronic hypoxic respiratory failure on 4L oxygen at home, hypertension, chronic diastolic CHF, and iron deficiency anemia, now presenting to the emergency department for evaluation of acute onset of bilateral lower extremity pain, swelling, and redness; diagnosed with Erythema nodosum   Clinical Impression   Pt feels he is almost back to baseline with ADL activity.  Pt able to tell OT how is able to use energy conservation strategies in his ADL activity .  OT provided handout to reinforce education    Follow Up Recommendations  No OT follow up    Equipment Recommendations  None recommended by OT    Recommendations for Other Services       Precautions / Restrictions Precautions Precautions: None Precaution Comments: becomes fatigued quickly      Mobility Bed Mobility               General bed mobility comments: pt up in recliner on arrival  Transfers Overall transfer level: Needs assistance Equipment used: Rolling walker (2 wheeled) Transfers: Sit to/from Stand;Stand Pivot Transfers Sit to Stand: Supervision Stand pivot transfers: Supervision                ADL either performed or assessed with clinical judgement   ADL Overall ADL's : At baseline                                       General ADL Comments: Pt feels he is basically back to baseline.  OT went over in detail energy conservation strategies with ADL activity . Handout provided.     Vision Patient Visual Report: No change from baseline       Perception     Praxis      Pertinent Vitals/Pain Pain Assessment: 0-10 Pain Score: 3  Pain Location: bil LEs Pain Descriptors / Indicators: Sore Pain Intervention(s): Monitored during session     Hand  Dominance     Extremity/Trunk Assessment Upper Extremity Assessment Upper Extremity Assessment: Generalized weakness   Lower Extremity Assessment Lower Extremity Assessment: Overall WFL for tasks assessed (R lower leg slightly more edematous, bil lower leg redness)       Communication Communication Communication: No difficulties   Cognition Arousal/Alertness: Awake/alert Behavior During Therapy: WFL for tasks assessed/performed Overall Cognitive Status: Within Functional Limits for tasks assessed                                                Home Living Family/patient expects to be discharged to:: Private residence Living Arrangements: Alone   Type of Home: House Home Access: Stairs to enter Secretary/administrator of Steps: 4 Entrance Stairs-Rails: None Home Layout: Two level Alternate Level Stairs-Number of Steps: full flight Alternate Level Stairs-Rails: None           Home Equipment: Cane - single point   Additional Comments: home oxygen, pulse oximeter      Prior Functioning/Environment Level of Independence: Independent                 OT Problem List:        OT Treatment/Interventions:  OT Goals(Current goals can be found in the care plan section) Acute Rehab OT Goals Patient Stated Goal: home today OT Goal Formulation: With patient  OT Frequency:     Barriers to D/C:               AM-PAC PT "6 Clicks" Daily Activity     Outcome Measure Help from another person eating meals?: None Help from another person taking care of personal grooming?: None Help from another person toileting, which includes using toliet, bedpan, or urinal?: None Help from another person bathing (including washing, rinsing, drying)?: None Help from another person to put on and taking off regular upper body clothing?: None Help from another person to put on and taking off regular lower body clothing?: None 6 Click Score: 24   End of Session  Nurse Communication: Mobility status  Activity Tolerance: Patient tolerated treatment well Patient left: in chair;with call bell/phone within reach                   Time: 1245-1302 OT Time Calculation (min): 17 min Charges:  OT General Charges $OT Visit: 1 Procedure OT Evaluation $OT Eval Moderate Complexity: 1 Procedure G-Codes: OT G-codes **NOT FOR INPATIENT CLASS** Functional Assessment Tool Used: Clinical judgement Functional Limitation: Self care Self Care Current Status (Z6109(G8987): At least 1 percent but less than 20 percent impaired, limited or restricted Self Care Goal Status (U0454(G8988): 0 percent impaired, limited or restricted Self Care Discharge Status [phone]([id number]): At least 1 percent but less than 20 percent impaired, limited or restricted   Lise AuerLori Amilah Greenspan, [place]OT [phone]  Einar CrowEDDING, Chanda Laperle D 06/21/2016, 1:11 PM

## 2016-06-21 NOTE — Progress Notes (Signed)
PT Cancellation Note  Patient Details Name: Andris Baumannommy Lawrance MRN: [id number] DOB: 06/17/1965   Cancelled Treatment:    Reason Eval/Treat Not Completed: Other (comment) (pt requested bathing first)   Bobbiejo Ishikawa,KATHrine E 06/21/2016, 9:58 AM Zenovia JarredKati Delray Reza, PT, DPT 06/21/2016 Pager: [phone]

## 2016-06-21 NOTE — Progress Notes (Signed)
Spoke with pt concerning discharge needs, there are none at present time. Pt has home O2 with [employer].

## 2016-06-21 NOTE — Evaluation (Signed)
Physical Therapy One Time Evaluation Patient Details Name: [name] MRN: [id number] DOB: 06/28/1965 Today's Date: 06/21/2016   History of Present Illness  51 y.o. male with medical history significant for pulmonary sarcoidosis with chronic hypoxic respiratory failure on 4L oxygen at home, hypertension, chronic diastolic CHF, and iron deficiency anemia, now presenting to the emergency department for evaluation of acute onset of bilateral lower extremity pain, swelling, and redness; diagnosed with Erythema nodosum  Clinical Impression  Patient evaluated by Physical Therapy with no further acute PT needs identified. All education has been completed and the patient has no further questions.  Pt mobilizing well however requiring at least 6L O2 [place] for ambulating.  Pt on 4L O2 at baseline.  Pt declines RW for home (used to assist with pain control today).  Pt agreeable to ambulate with staff for remainder of acute stay. See below for any follow-up Physical Therapy or equipment needs. PT is signing off. Thank you for this referral.     Follow Up Recommendations No PT follow up    Equipment Recommendations  Rolling walker with 5" wheels (however pt declines RW)    Recommendations for Other Services       Precautions / Restrictions Precautions Precautions: None      Mobility  Bed Mobility               General bed mobility comments: pt up in recliner on arrival  Transfers Overall transfer level: Needs assistance Equipment used: Rolling walker (2 wheeled) Transfers: Sit to/from Stand Sit to Stand: Supervision            Ambulation/Gait Ambulation/Gait assistance: Supervision Ambulation Distance (Feet): 600 Feet Assistive device: Rolling walker (2 wheeled) Gait Pattern/deviations: Step-through pattern;Decreased stride length     General Gait Details: pt requested increase in oxygen (started on 4L O2), increased to 6L O2 and Spo2 79-85% during standing rest break, pt  reports he has pulse oximeter at home and checks his sats occasionally  Stairs            Wheelchair Mobility    Modified Rankin (Stroke Patients Only)       Balance                                             Pertinent Vitals/Pain Pain Assessment: 0-10 Pain Score: 4  Pain Location: bil LEs Pain Descriptors / Indicators: Sore Pain Intervention(s): Limited activity within patient's tolerance;Monitored during session;Repositioned    Home Living Family/patient expects to be discharged to:: Private residence Living Arrangements: Alone   Type of Home: House Home Access: Stairs to enter Entrance Stairs-Rails: None Entrance Stairs-Number of Steps: 4 Home Layout: Two level Home Equipment: Cane - single point Additional Comments: home oxygen, pulse oximeter    Prior Function Level of Independence: Independent               Hand Dominance        Extremity/Trunk Assessment        Lower Extremity Assessment Lower Extremity Assessment: Overall WFL for tasks assessed (R lower leg slightly more edematous, bil lower leg redness)       Communication   Communication: No difficulties  Cognition Arousal/Alertness: Awake/alert Behavior During Therapy: WFL for tasks assessed/performed Overall Cognitive Status: Within Functional Limits for tasks assessed  General Comments      Exercises     Assessment/Plan    PT Assessment Patent does not need any further PT services  PT Problem List         PT Treatment Interventions      PT Goals (Current goals can be found in the Care Plan section)  Acute Rehab PT Goals PT Goal Formulation: All assessment and education complete, DC therapy    Frequency     Barriers to discharge        Co-evaluation               AM-PAC PT "6 Clicks" Daily Activity  Outcome Measure Difficulty turning over in bed (including adjusting bedclothes,  sheets and blankets)?: None Difficulty moving from lying on back to sitting on the side of the bed? : None Difficulty sitting down on and standing up from a chair with arms (e.g., wheelchair, bedside commode, etc,.)?: None Help needed moving to and from a bed to chair (including a wheelchair)?: None Help needed walking in hospital room?: A Little Help needed climbing 3-5 steps with a railing? : A Little 6 Click Score: 22    End of Session Equipment Utilized During Treatment: Oxygen Activity Tolerance: Patient tolerated treatment well Patient left: in chair;with call bell/phone within reach Nurse Communication: Mobility status PT Visit Diagnosis: Difficulty in walking, not elsewhere classified (R26.2)    Time: 4098-1191 PT Time Calculation (min) (ACUTE ONLY): 18 min   Charges:   PT Evaluation $PT Eval Low Complexity: 1 Procedure     PT G Codes:   PT G-Codes **NOT FOR INPATIENT CLASS** Functional Assessment Tool Used: AM-PAC 6 Clicks Basic Mobility Functional Limitation: Mobility: Walking and moving around Mobility: Walking and Moving Around Current Status (Y7829): At least 1 percent but less than 20 percent impaired, limited or restricted Mobility: Walking and Moving Around Goal Status [phone]): At least 1 percent but less than 20 percent impaired, limited or restricted Mobility: Walking and Moving Around Discharge Status [phone]): At least 1 percent but less than 20 percent impaired, limited or restricted    Zenovia Jarred, PT, DPT 06/21/2016 Pager: [phone]  Maida Sale E 06/21/2016, 12:50 PM

## 2016-06-27 NOTE — Discharge Summary (Signed)
[facility] Discharge Summary   Patient: [name] Baumannommy Baiz NFA:[id number]   [facility] DOB: 12/30/1965   Date of admission: 06/19/2016   Date of discharge: 06/21/2016     Discharge Diagnoses:  Principal Problem:   Erythema nodosum, acute form Active Problems:   Sarcoid (HCC)   OSA on CPAP   Hyperglycemia   Bronchiectasis without acute exacerbation (HCC)   Normocytic anemia   Chronic respiratory failure with hypoxia (HCC)   Essential hypertension   Hypertension   Cellulitis   Admitted From: home Disposition:  home  Recommendations for Outpatient Follow-up:  1. Please follow up with PCP and pulmonary as recommended   Follow-up Information    [facility]. Schedule an appointment as soon as possible for a visit in 1 week(s).   Contact information: [facility] [address] [place] [id number] [phone]          Diet recommendation: cardiac diet  Activity: The patient is advised to gradually reintroduce usual activities.  Discharge Condition: good  Code Status: full code  History of present illness: As per the H and P dictated on admission, "[name] Baumannommy [name] is a 51 y.o. 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 Patient reports that he been in his usual state of health until, upon waking yesterday morning, noted pain in the bilateral lower legs with associated redness and swelling. Patient describes the pain as severe, sharp and burning, localized to the bilateral lower legs with left worse than right, much worse with movement or weightbearing, and with no alleviating factors identified. Patient has never experienced similar symptoms previously. He reports that his breathing has been  stable and denies any fevers, chills, worsening in his chronic cough or dyspnea, chest pain, or palpitations. Patient denies any abdominal pain, nausea, vomiting, or diarrhea. He has not attempted any interventions for the symptoms prior to coming in."  Hospital Course:  Summary of his active problems in the hospital is as following.  1. 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 Sarcoidosis, bronchiectasis with chronic hypoxic respiratory failure - Pt with pulmonary sarcoidosis, dependent on 4 Lpm supplemental O2 atc  - This appears to be stable  - Continue scheduled ICS/LABA and prn DuoNeb  Continue steroids at home.   3. Chronic diastolic CHF - Increased congestion noted on CXR and mild bilateral ankle edema present, though possibly related to #1 - His respiratory status is at baseline, JVP is wnl, and BNP low  - TTE (01/10/14) with EF 60-65%, grade 1 diastolic dysfunction, no WMA's, mild pulmonic regurgitation - Continue chlorthalidone, -Appear stable at this time  4. Hypertension  - BP remains at goal  - Continue chlorthalidone and diltiazem as tolerated  5. Hyperglycemia  - Serum glucose 176 on admission  - A1c was 6.4% in April 2016   6. Iron-deficiency anemia  - Hgb is 11.9 on admission, slightly lower than priors, no evidence for bleeding  - Continue iron-supplementation as  tolerated  7. OSA on CPAP - CPAP qHS  -Stable at present   All other chronic medical condition were stable during the hospitalization.  Patient was seen by physical therapy, who recommended home health,  which was arranged by Child psychotherapist and case Production designer, theatre/television/film. On the day of the discharge the patient's vitals were stable, and no other acute medical condition were reported by patient. the patient was felt safe to be discharge at home with home health.  Procedures and Results:  none   Consultations:  none  DISCHARGE MEDICATION: Discharge Medication List as of 06/21/2016 12:06 PM    START taking these medications   Details  amitriptyline (ELAVIL) 25 MG tablet Take 1 tablet (25 mg total) by mouth at bedtime., Starting Mon 06/21/2016, Normal    cephALEXin (KEFLEX) 500 MG capsule Take 1 capsule (500 mg total) by mouth 4 (four) times daily., Starting Mon 06/21/2016, Until Fri 06/25/2016, Normal      CONTINUE these medications which have CHANGED   Details  predniSONE (DELTASONE) 10 MG tablet Take 40mg  daily for 3days,Take 20mg  daily for 7 days, Take 10mg  daily for 3days,, Normal      CONTINUE these medications which have NOT CHANGED   Details  acetaminophen (TYLENOL) 325 MG tablet Take 2 tablets (650 mg total) by mouth every 6 (six) hours as needed for mild pain (or Fever >/= 101)., Starting Mon 04/19/2016, Print    albuterol (PROVENTIL HFA;VENTOLIN HFA) 108 (90 Base) MCG/ACT inhaler Inhale 2 puffs into the lungs every 6 (six) hours as needed for wheezing or shortness of breath., Starting Mon 04/19/2016, Print    bisacodyl (DULCOLAX) 5 MG EC tablet Take 5 mg by mouth daily as needed for moderate constipation., Historical Med    calcium-vitamin D (OSCAL WITH D) 500-200 MG-UNIT tablet Take 1 tablet by mouth daily., Historical Med    CARTIA XT 120 MG 24 hr capsule Take 120 mg by mouth every morning., Starting Fri 04/16/2016, Historical Med    chlorthalidone (HYGROTON) 25 MG tablet Take 25 mg by mouth daily., Starting Thu 06/17/2016, Historical Med    cyclobenzaprine (FLEXERIL) 10 MG tablet Take 10 mg by mouth 3 (three) times daily as needed for muscle spasms., Historical Med    diphenhydrAMINE  (BENADRYL) 25 MG tablet Take 25 mg by mouth every 6 (six) hours as needed for itching or allergies., Historical Med    Ergocalciferol (VITAMIN D2) 400 units TABS Take 400 Units by mouth daily., Starting 03/18/2008, Until Discontinued, Historical Med    ferrous sulfate 325 (65 FE) MG tablet Take 325 mg by mouth 2 (two) times daily with a meal., Historical Med    guaiFENesin (MUCINEX) 600 MG 12 hr tablet Take 600 mg by mouth 2 (two) times daily as needed for cough or to loosen phlegm., Historical Med    ipratropium-albuterol (DUONEB) 0.5-2.5 (3) MG/3ML SOLN Take 3 mLs by nebulization every 4 (four) hours as needed., Starting Mon 04/19/2016, Print    Multiple Vitamins-Minerals (MULTIVITAMIN WITH MINERALS) tablet Take 1 tablet by mouth every morning. , Until Discontinued, Historical Med    omeprazole (PRILOSEC) 40 MG capsule Take 40 mg by mouth 2 (two) times daily., Historical Med    ondansetron (ZOFRAN) 4 MG tablet Take 4 mg by mouth every 8 (eight) hours as needed for nausea or vomiting., Historical Med    OXYGEN Inhale 4 L into the lungs continuous., Historical Med    polyethylene glycol (MIRALAX / GLYCOLAX) packet Take 17 g by mouth daily as needed (  constipation)., Historical Med    saline (AYR) GEL Place 1 application into the nose every 4 (four) hours as needed (dryness/irritation)., Historical Med    SYMBICORT 160-4.5 MCG/ACT inhaler Inhale 1 puff into the lungs 2 (two) times daily., Starting Mon 04/19/2016, Print       Allergies  Allergen Reactions  . Clindamycin/Lincomycin Anaphylaxis and Swelling    Made face and tongue swell  . Clindamycin/Lincomycin Swelling    Facial and tongue swelling   Discharge Instructions    Diet - low sodium heart healthy    Complete by:  As directed    Discharge instructions    Complete by:  As directed    It is important that you read following instructions as well as go over your medication list with RN to help you understand your care after this  hospitalization.  Discharge Instructions: Please follow-up with PCP in one week  Please request your primary care physician to go over all Hospital Tests and Procedure/Radiological results at the follow up,  Please get all Hospital records sent to your PCP by signing hospital release before you go home.   Do not take more than prescribed Pain, Sleep and Anxiety Medications. You were cared for by a hospitalist during your hospital stay. If you have any questions about your discharge medications or the care you received while you were in the hospital after you are discharged, you can call the unit and ask to speak with the hospitalist on call if the hospitalist that took care of you is not available.  Once you are discharged, your primary care physician will handle any further medical issues. Please note that NO REFILLS for any discharge medications will be authorized once you are discharged, as it is imperative that you return to your primary care physician (or establish a relationship with a primary care physician if you do not have one) for your aftercare needs so that they can reassess your need for medications and monitor your lab values. You Must read complete instructions/literature along with all the possible adverse reactions/side effects for all the Medicines you take and that have been prescribed to you. Take any new Medicines after you have completely understood and accept all the possible adverse reactions/side effects. Wear Seat belts while driving. If you have smoked or chewed Tobacco in the last 2 yrs please stop smoking and/or stop any Recreational drug use.   Increase activity slowly    Complete by:  As directed      Discharge Exam: Filed Weights   06/19/16 2041 06/20/16 0640 06/21/16 0646  Weight: 73.5 kg (162 lb) 75.6 kg (166 lb 11.2 oz) 76 kg (167 lb 8 oz)   Vitals:   06/20/16 2143 06/21/16 0646  BP: 110/77 117/83  Pulse: 93 100  Resp: 18 18  Temp: 98 F (36.7 C) 98.2  F (36.8 C)   General: Appear in mild distress, bilateral leg Rash; Oral Mucosa moist. Cardiovascular: S1 and S2 Present, no Murmur, no JVD Respiratory: Bilateral Air entry present and bilateral Crackles, no wheezes Abdomen: Bowel Sound present, Soft and no tenderness Extremities: no Pedal edema, no calf tenderness Neurology: Grossly no focal neuro deficit.  The results of significant diagnostics from this hospitalization (including imaging, microbiology, ancillary and laboratory) are listed below for reference.    Significant Diagnostic Studies: Dg Chest 2 View  Result Date: 06/19/2016 CLINICAL DATA:  Bilateral lower leg swelling and erythema. History of sarcoid, COPD. EXAM: CHEST  2 VIEW COMPARISON:  05/05/2016 CT,  05/04/2016 CXR FINDINGS: The heart size and mediastinal contours are within normal limits. Bullous emphysematous changes are seen in the upper lobes right greater than left with chronic pleuroparenchymal scarring about the hila bilaterally. There does appear to be more vascular congestion on current exam without pneumonic consolidation, effusion or pneumothorax. The visualized skeletal structures are unremarkable. IMPRESSION: 1. Upper lobe predominant emphysematous disease with bullous changes right greater than left. 2. Slight interval increase in vascular congestion since previous exam. 3. Chronic pleuroparenchymal scarring in the midlung bilaterally. Electronically Signed   By: Tollie Eth M.D.   On: 06/19/2016 21:08    Microbiology: No results found for this or any previous visit (from the past 240 hour(s)).   Labs: CBC:  Recent Labs Lab 06/21/16 0527  WBC 13.8*  HGB 10.3*  HCT 31.6*  MCV 82.5  PLT 236   Basic Metabolic Panel:  Recent Labs Lab 06/21/16 0527  NA 134*  K 4.2  CL 95*  CO2 29  GLUCOSE 206*  BUN 17  CREATININE 0.89  CALCIUM 9.1   Liver Function Tests: No results for input(s): AST, ALT, ALKPHOS, BILITOT, PROT, ALBUMIN in the last 168  hours. No results for input(s): LIPASE, AMYLASE in the last 168 hours. No results for input(s): AMMONIA in the last 168 hours. Cardiac Enzymes: No results for input(s): CKTOTAL, CKMB, CKMBINDEX, TROPONINI in the last 168 hours. BNP (last 3 results)  Recent Labs  04/16/16 0537 05/01/16 0048 06/20/16 0154  BNP 69.8 15.0 32.2   CBG:  Recent Labs Lab 06/20/16 2142 06/21/16 0754 06/21/16 1101  GLUCAP 367* 175* 165*   Time spent: 35 minutes  Signed:  Lynden Oxford  [facility] 06/21/2016  5:13 PM

## 2016-07-04 IMAGING — CR DG CHEST 2V
2 series · 2 of 2 positions shown · non-contrast
Comparison: January 13, 2015 chest radiograph and chest CT September 30, 2014

CLINICAL DATA: Shortness of breath for several days. History of
sarcoidosis

EXAM:
CHEST  2 VIEW

[w chest pa]
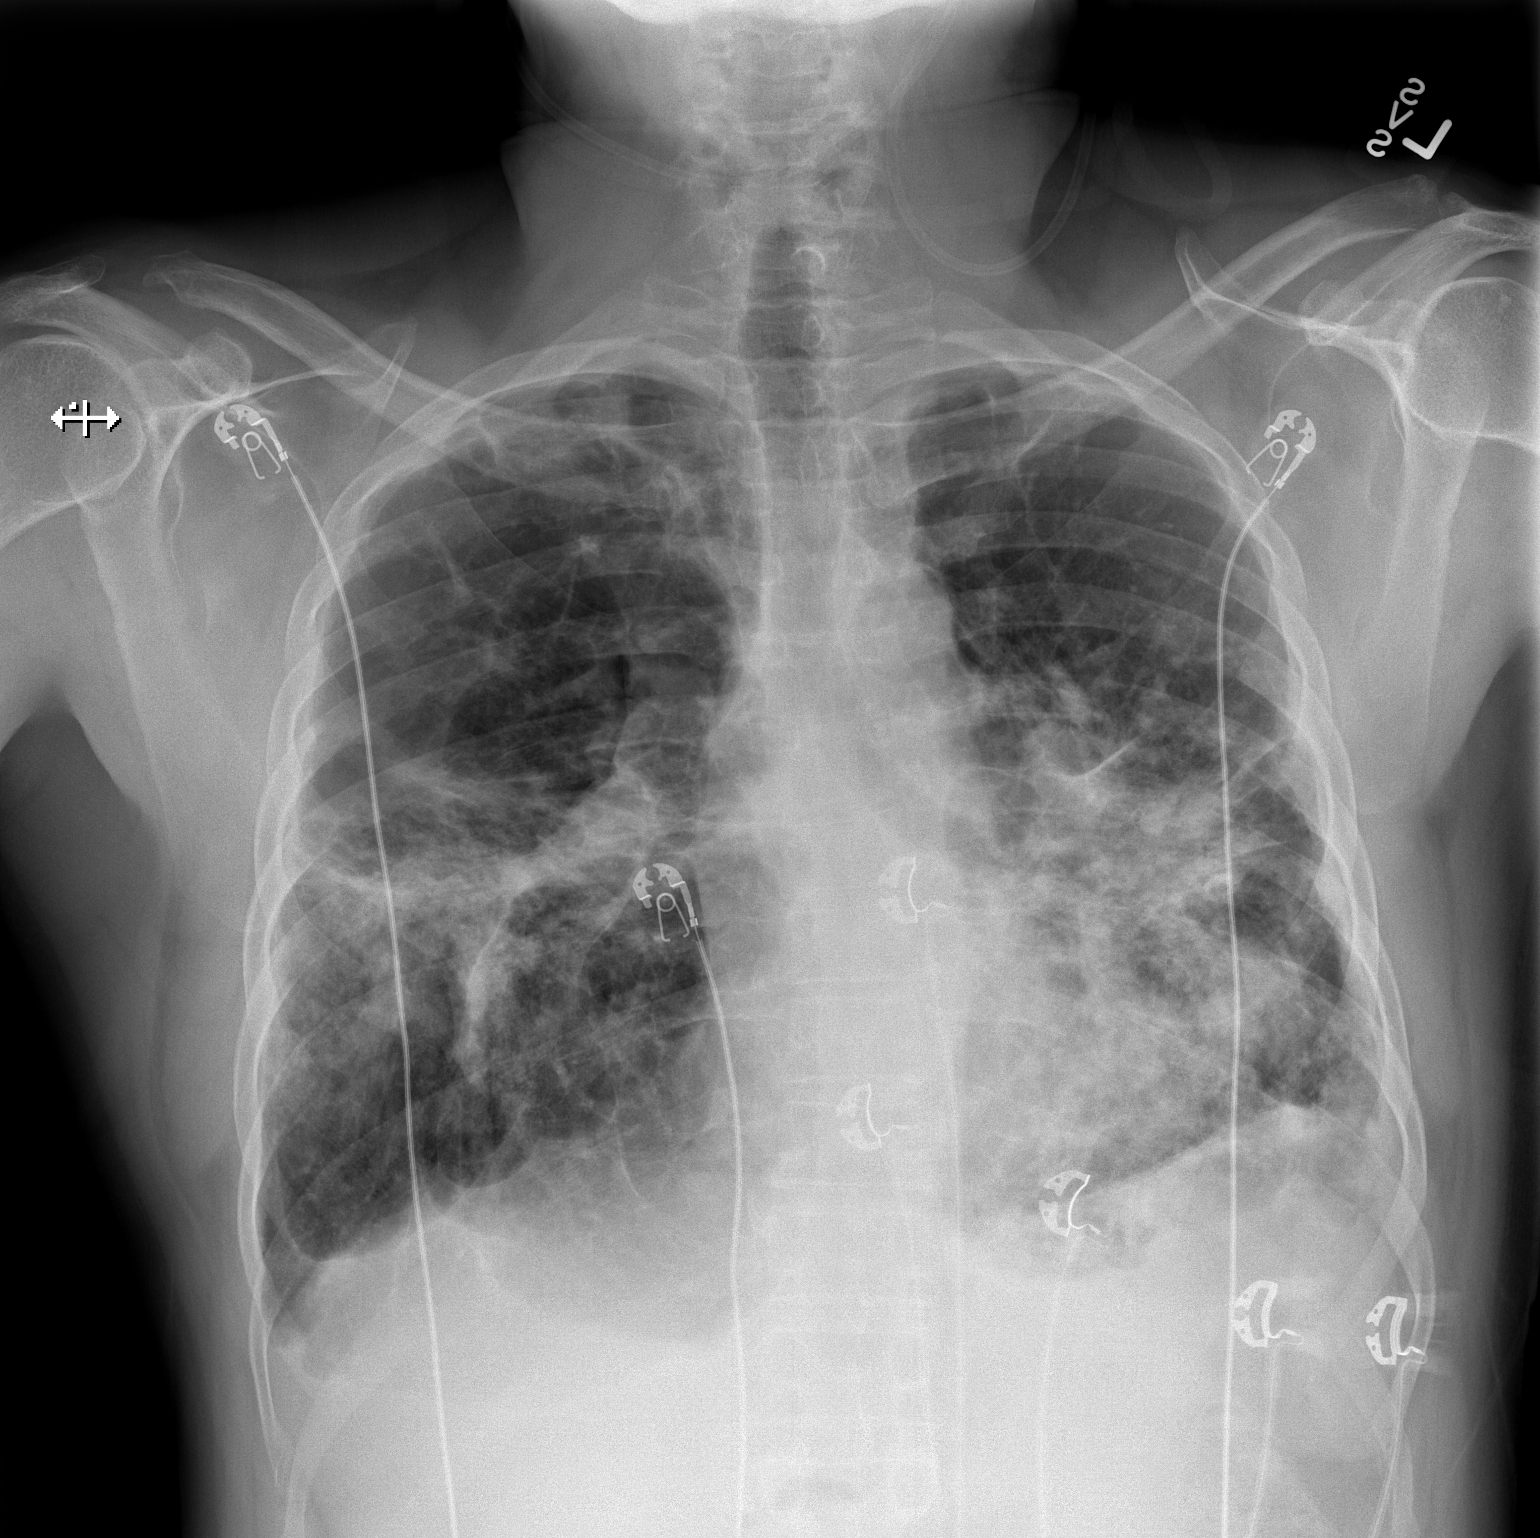

[w chest lat]
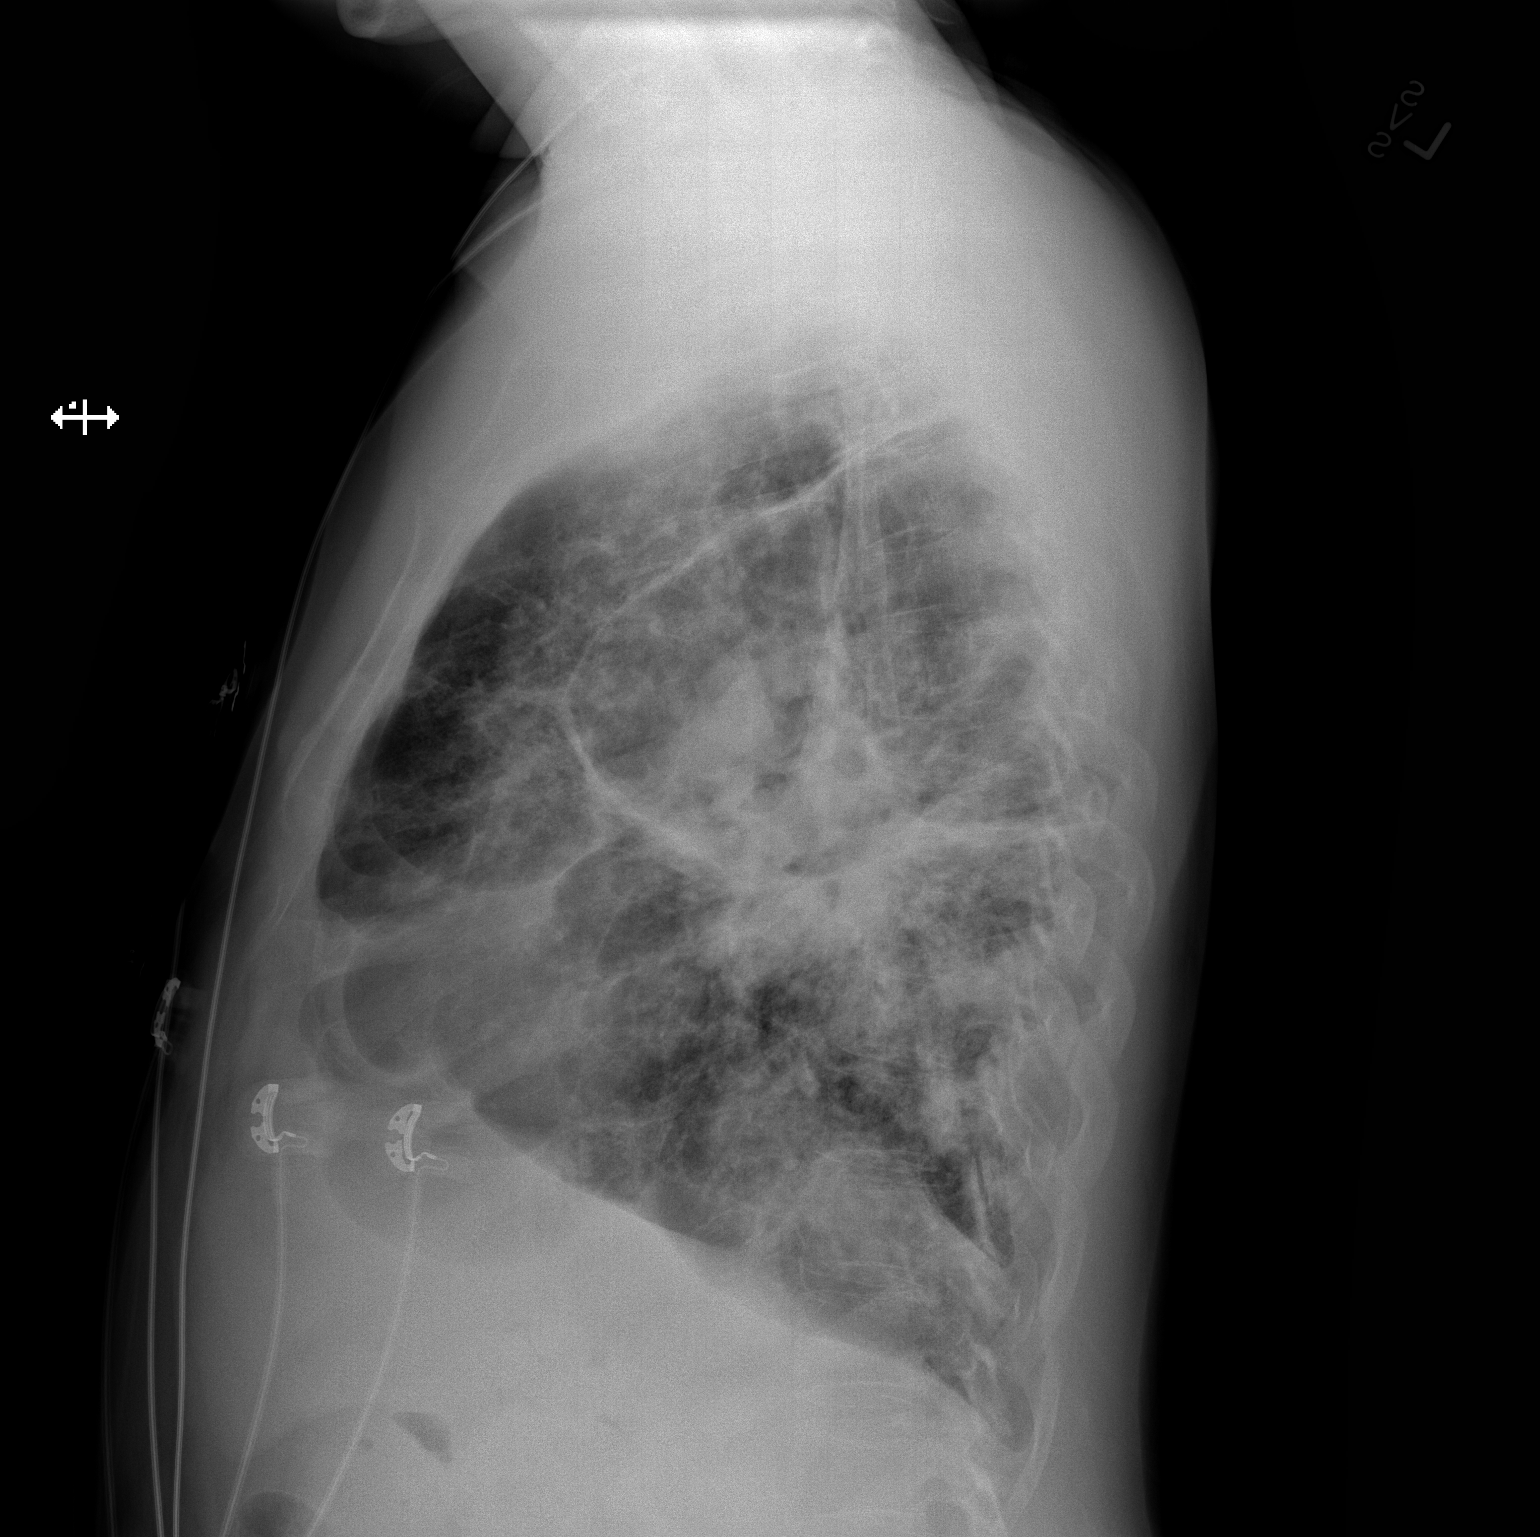

[2 of 2 positions shown; findings below may reference images not displayed]

FINDINGS: There is widespread fibrotic change throughout the lungs bilaterally
with airspace consolidation in both mid lungs and left lower lobe
region, stable. There is scarring in the upper lobes as well as in
the right base with chronic lateral basilar pleural thickening.
There is a large bulla in the right upper lobe, stable.

No new opacity is evident. The heart size and pulmonary vascularity
are normal. No adenopathy is apparent. No bone lesions are evident.
IMPRESSION: Extensive cicatrization as well as areas of patchy airspace
consolidation, more severe on the left than on the right, and
scarring throughout the lungs bilaterally. No new opacity is
evident. No adenopathy. The changes in the lungs are consistent with
chronic sarcoidosis.

It should be noted that subtle acute pneumonia could easily be
obscured with this degree of underlying parenchymal scarring and
fibrotic type change.

## 2016-09-01 ENCOUNTER — Other Ambulatory Visit: Payer: Self-pay | Admitting: Emergency Medicine

## 2016-09-17 ENCOUNTER — Telehealth: Payer: Self-pay | Admitting: Emergency Medicine

## 2016-09-17 NOTE — Telephone Encounter (Signed)
Form received from RB folder up front and placed in his cubby to review.  Will forward to RB to make him aware.

## 2016-09-22 NOTE — Telephone Encounter (Signed)
Form will be addressed by RB today.

## 2016-09-23 NOTE — Telephone Encounter (Signed)
Form has signed and returned to me. Pt is aware that the form is ready for pick up. Nothing further was needed.

## 2016-09-30 IMAGING — CR DG CHEST 2V
2 series · 2 of 2 positions shown · non-contrast
Comparison: April 24, 2015

CLINICAL DATA: History of sarcoidosis.  Difficulty breathing

EXAM:
CHEST  2 VIEW

[w chest pa]
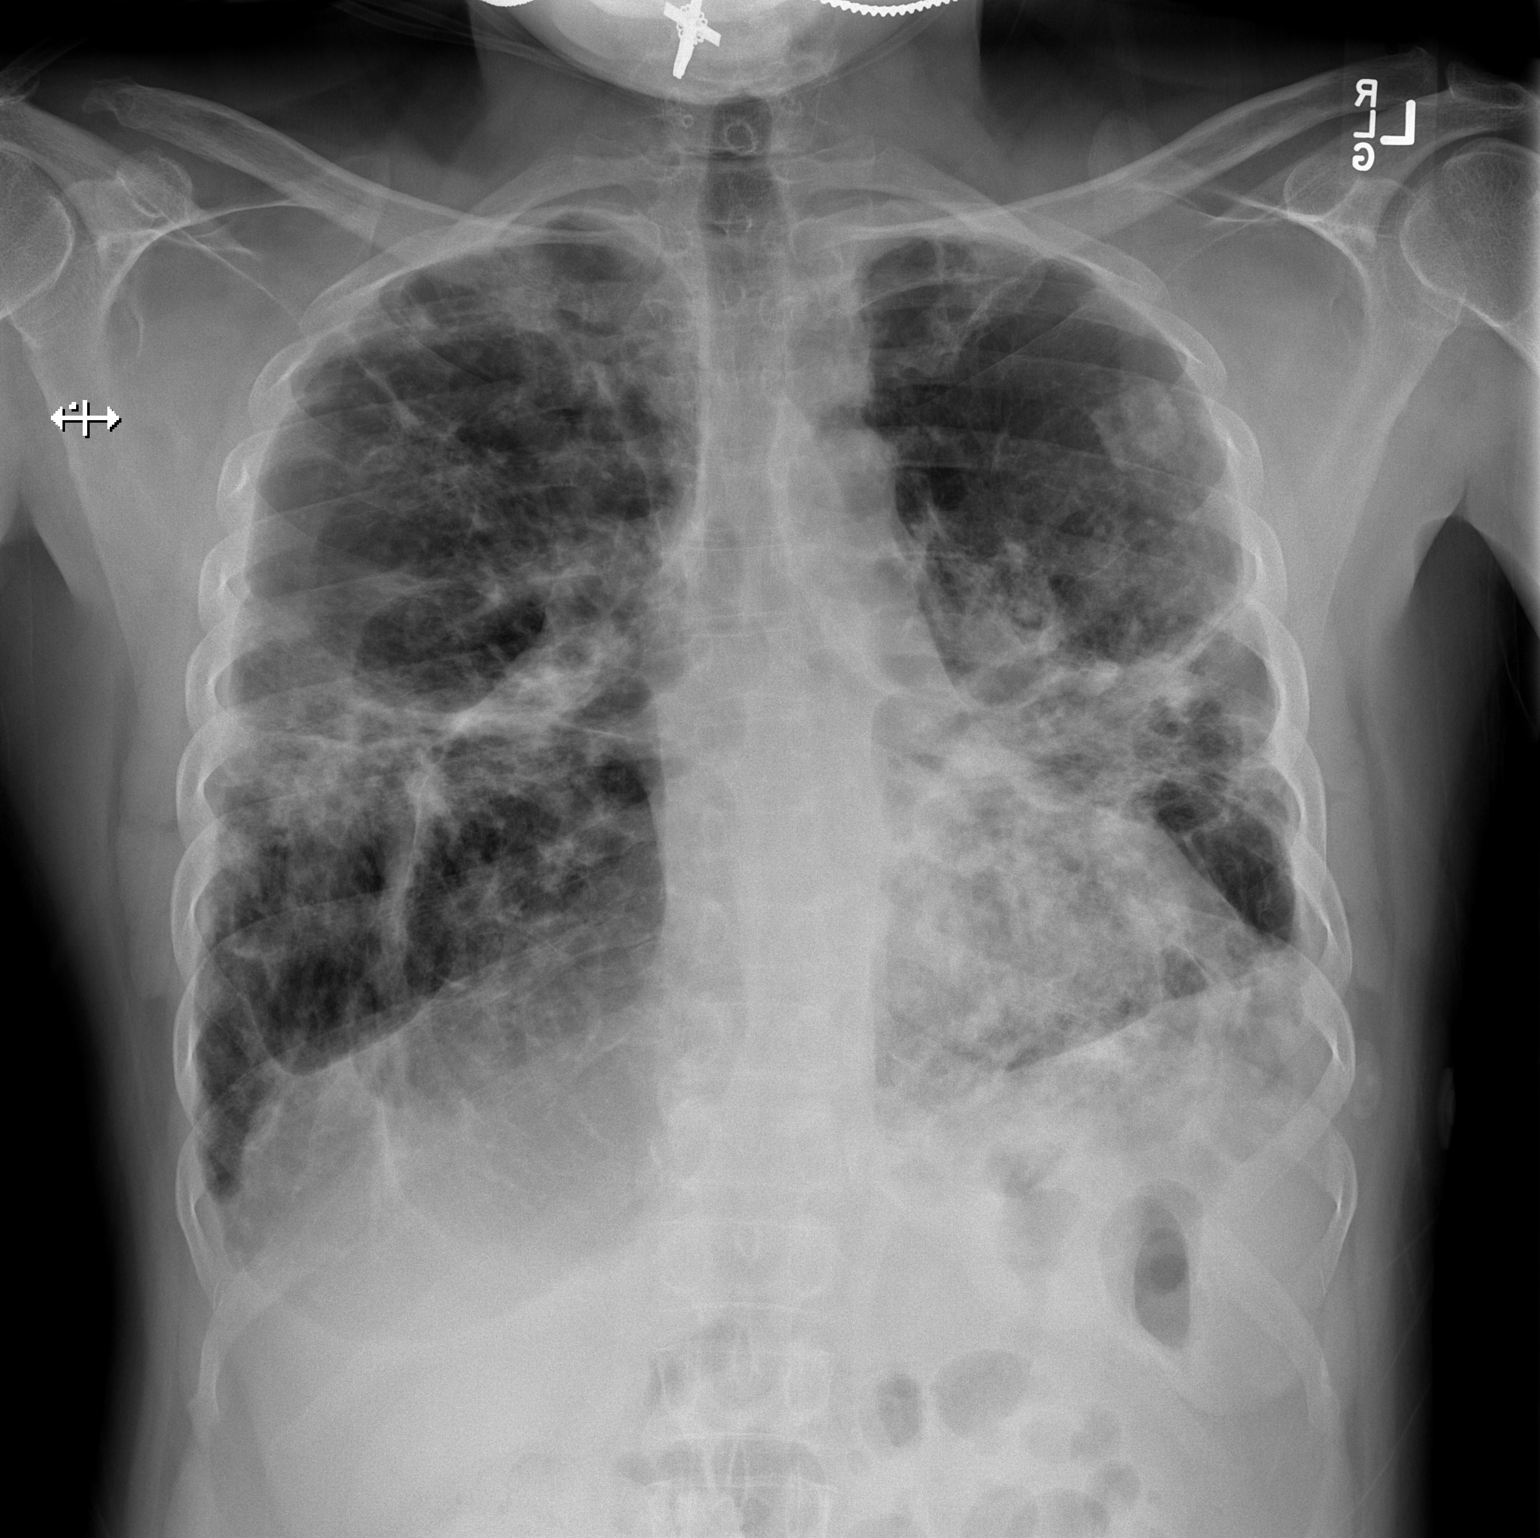

[w chest lat]
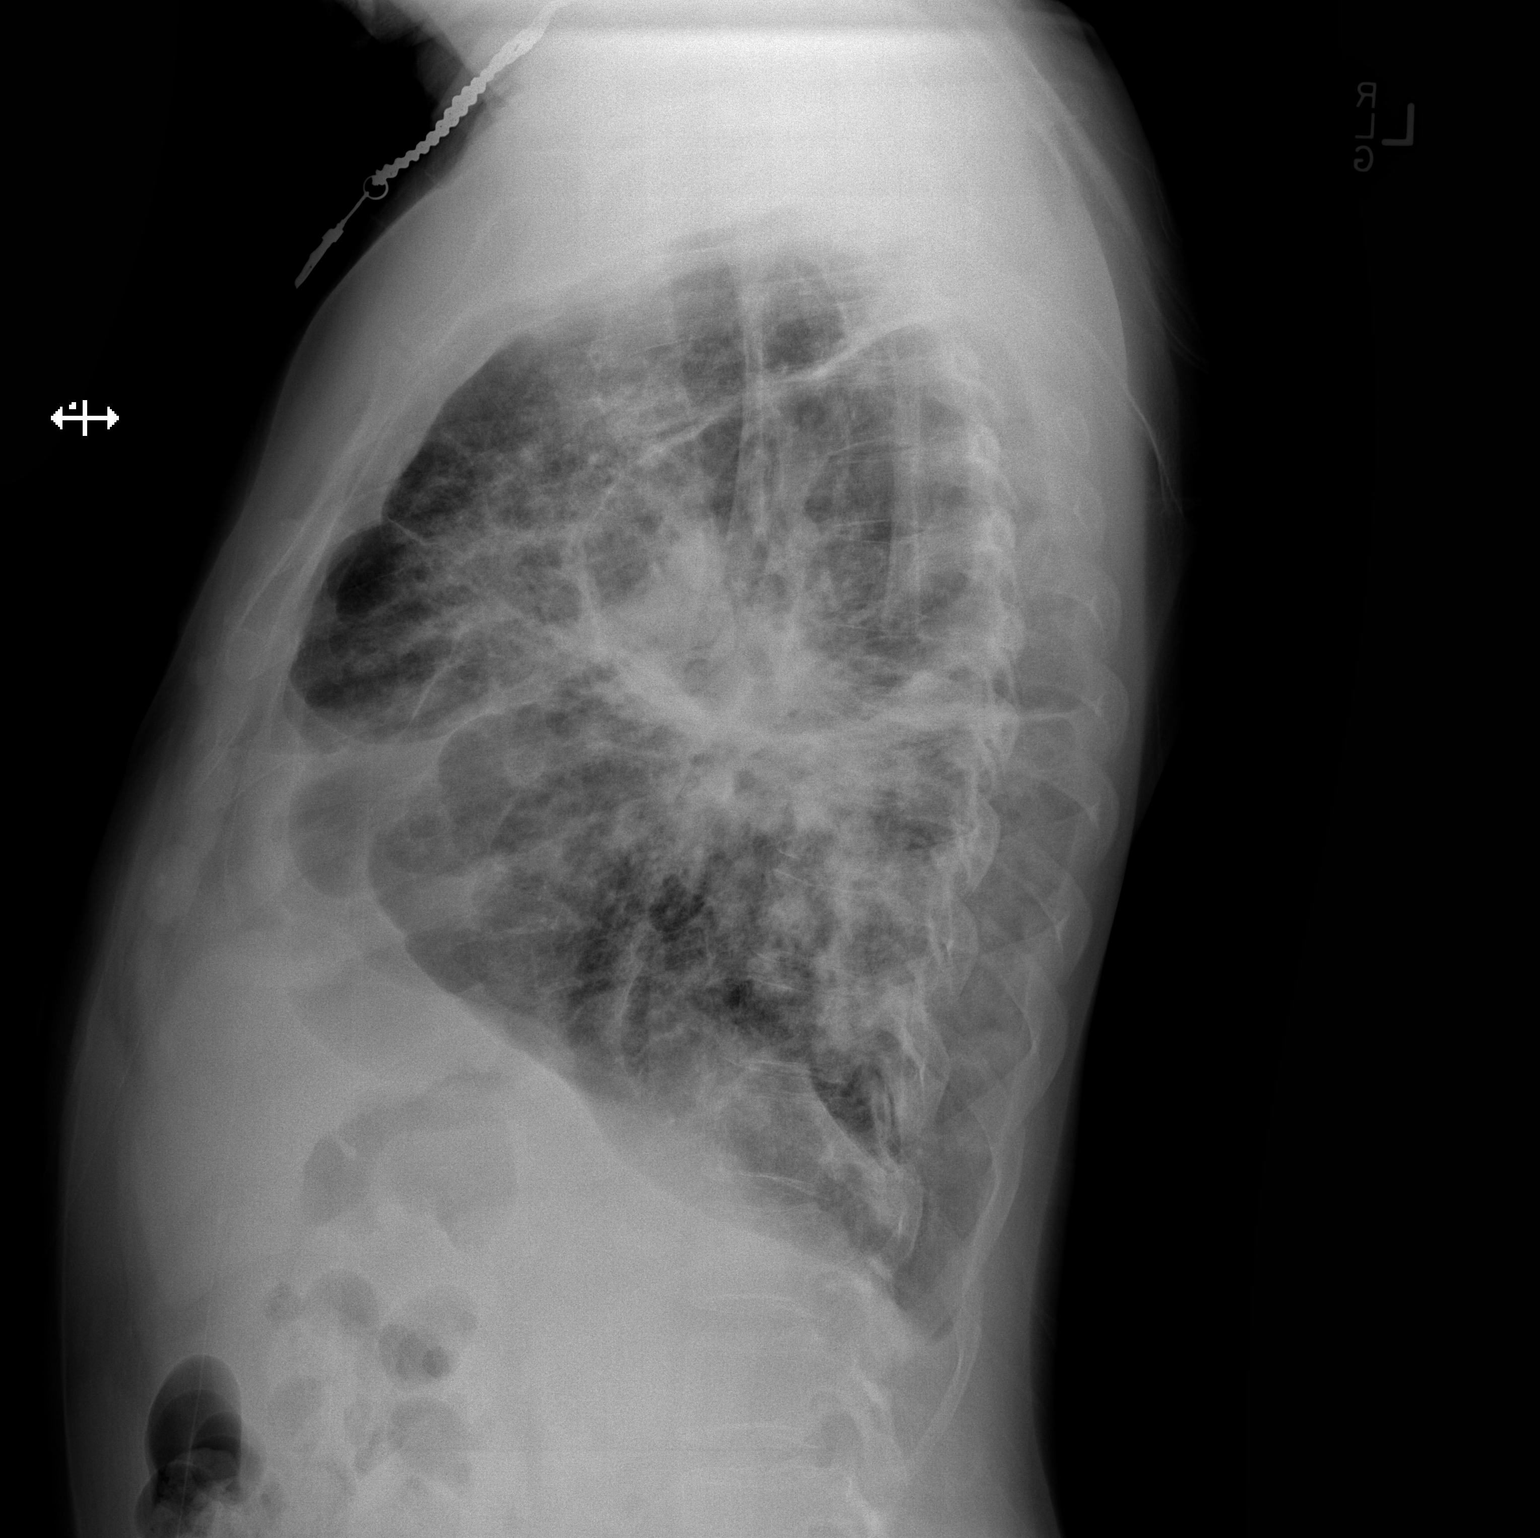

[2 of 2 positions shown; findings below may reference images not displayed]

FINDINGS: Widespread cicatrization is noted, stable. There are areas of
probable chronic mucous plugging with patchy opacity throughout the
lungs, most severe in the mid lower lung zones, stable. There is no
new opacity appreciable. The heart size and pulmonary vascularity
are normal. No adenopathy is demonstrable. No bone lesions.
IMPRESSION: No appreciable change from study performed 3 months prior. Extensive
scarring and areas of probable chronic mucous plugging. No new
opacity. No change in cardiac silhouette. These changes are
consistent with known history of sarcoidosis. It should be noted
that subtle pneumonia could easily be obscured given the degree of
scarring and opacity present currently.

## 2016-10-11 ENCOUNTER — Encounter (HOSPITAL_COMMUNITY): Payer: Self-pay

## 2016-10-11 ENCOUNTER — Inpatient Hospital Stay (HOSPITAL_COMMUNITY)
Admission: EM | Admit: 2016-10-11 | Discharge: 2016-10-13 | DRG: 193 | Disposition: A | Discharge status: Home / Self Care | Payer: 59 | Attending: Internal Medicine | Admitting: Internal Medicine

## 2016-10-11 ENCOUNTER — Emergency Department (HOSPITAL_COMMUNITY): Payer: 59

## 2016-10-11 DIAGNOSIS — Z881 Allergy status to other antibiotic agents status: Secondary | ICD-10-CM | POA: Diagnosis not present

## 2016-10-11 DIAGNOSIS — K219 Gastro-esophageal reflux disease without esophagitis: Secondary | ICD-10-CM | POA: Diagnosis present

## 2016-10-11 DIAGNOSIS — D649 Anemia, unspecified: Secondary | ICD-10-CM | POA: Diagnosis present

## 2016-10-11 DIAGNOSIS — J181 Lobar pneumonia, unspecified organism: Secondary | ICD-10-CM

## 2016-10-11 DIAGNOSIS — Z9981 Dependence on supplemental oxygen: Secondary | ICD-10-CM

## 2016-10-11 DIAGNOSIS — Z9119 Patient's noncompliance with other medical treatment and regimen: Secondary | ICD-10-CM

## 2016-10-11 DIAGNOSIS — F1410 Cocaine abuse, uncomplicated: Secondary | ICD-10-CM | POA: Diagnosis present

## 2016-10-11 DIAGNOSIS — Z9989 Dependence on other enabling machines and devices: Secondary | ICD-10-CM

## 2016-10-11 DIAGNOSIS — I10 Essential (primary) hypertension: Secondary | ICD-10-CM | POA: Diagnosis not present

## 2016-10-11 DIAGNOSIS — I110 Hypertensive heart disease with heart failure: Secondary | ICD-10-CM | POA: Diagnosis present

## 2016-10-11 DIAGNOSIS — J189 Pneumonia, unspecified organism: Secondary | ICD-10-CM | POA: Diagnosis present

## 2016-10-11 DIAGNOSIS — G4733 Obstructive sleep apnea (adult) (pediatric): Secondary | ICD-10-CM | POA: Diagnosis not present

## 2016-10-11 DIAGNOSIS — J9621 Acute and chronic respiratory failure with hypoxia: Secondary | ICD-10-CM | POA: Diagnosis present

## 2016-10-11 DIAGNOSIS — G8929 Other chronic pain: Secondary | ICD-10-CM | POA: Diagnosis present

## 2016-10-11 DIAGNOSIS — Z79899 Other long term (current) drug therapy: Secondary | ICD-10-CM | POA: Diagnosis not present

## 2016-10-11 DIAGNOSIS — D860 Sarcoidosis of lung: Secondary | ICD-10-CM | POA: Diagnosis present

## 2016-10-11 DIAGNOSIS — I5032 Chronic diastolic (congestive) heart failure: Secondary | ICD-10-CM | POA: Diagnosis present

## 2016-10-11 DIAGNOSIS — J440 Chronic obstructive pulmonary disease with acute lower respiratory infection: Secondary | ICD-10-CM | POA: Diagnosis present

## 2016-10-11 DIAGNOSIS — D869 Sarcoidosis, unspecified: Secondary | ICD-10-CM | POA: Diagnosis not present

## 2016-10-11 LAB — CBC WITH DIFFERENTIAL/PLATELET
BASOS PCT: 0 %
Basophils Absolute: 0 10*3/uL (ref 0.0–0.1)
EOS ABS: 0.7 10*3/uL (ref 0.0–0.7)
EOS PCT: 13 %
HCT: 37.4 % — ABNORMAL LOW (ref 39.0–52.0)
HEMOGLOBIN: 11.8 g/dL — AB (ref 13.0–17.0)
LYMPHS ABS: 1 10*3/uL (ref 0.7–4.0)
Lymphocytes Relative: 17 %
MCH: 25.6 pg — AB (ref 26.0–34.0)
MCHC: 31.6 g/dL (ref 30.0–36.0)
MCV: 81.1 fL (ref 78.0–100.0)
MONOS PCT: 9 %
Monocytes Absolute: 0.5 10*3/uL (ref 0.1–1.0)
NEUTROS PCT: 61 %
Neutro Abs: 3.5 10*3/uL (ref 1.7–7.7)
PLATELETS: 301 10*3/uL (ref 150–400)
RBC: 4.61 MIL/uL (ref 4.22–5.81)
RDW: 12.4 % (ref 11.5–15.5)
WBC: 5.8 10*3/uL (ref 4.0–10.5)

## 2016-10-11 LAB — COMPREHENSIVE METABOLIC PANEL
ALT: 16 U/L — ABNORMAL LOW (ref 17–63)
ANION GAP: 10 (ref 5–15)
AST: 31 U/L (ref 15–41)
Albumin: 3.6 g/dL (ref 3.5–5.0)
Alkaline Phosphatase: 95 U/L (ref 38–126)
BUN: 12 mg/dL (ref 6–20)
CALCIUM: 9.1 mg/dL (ref 8.9–10.3)
CHLORIDE: 98 mmol/L — AB (ref 101–111)
CO2: 32 mmol/L (ref 22–32)
Creatinine, Ser: 0.94 mg/dL (ref 0.61–1.24)
GFR calc Af Amer: >60 mL/min (ref >60)
GFR calc non Af Amer: >60 mL/min (ref >60)
GLUCOSE: 98 mg/dL (ref 65–99)
POTASSIUM: 3.7 mmol/L (ref 3.5–5.1)
SODIUM: 140 mmol/L (ref 135–145)
TOTAL PROTEIN: 7.3 g/dL (ref 6.5–8.1)
Total Bilirubin: 0.5 mg/dL (ref 0.3–1.2)

## 2016-10-11 LAB — CG4 I-STAT (LACTIC ACID): LACTIC ACID, VENOUS: 0.93 mmol/L (ref 0.5–1.9)

## 2016-10-11 LAB — POCT I-STAT TROPONIN I: Troponin i, poc: 0 ng/mL (ref 0.00–0.08)

## 2016-10-11 MED ORDER — VANCOMYCIN HCL IN DEXTROSE 1-5 GM/200ML-% IV SOLN: 1000.0000 mg | Freq: Two times a day (BID) | INTRAVENOUS | Status: DC

## 2016-10-11 MED ORDER — IPRATROPIUM-ALBUTEROL 0.5-2.5 (3) MG/3ML IN SOLN: 3.0000 mL | RESPIRATORY_TRACT | Status: DC

## 2016-10-11 MED ORDER — DEXTROSE 5 % IV SOLN
1.0000 g | Freq: Once | INTRAVENOUS | Status: DC
  Filled 2016-10-11: qty 10

## 2016-10-11 MED ORDER — IOPAMIDOL (ISOVUE-370) INJECTION 76%
INTRAVENOUS | Status: AC
  Filled 2016-10-11: qty 100

## 2016-10-11 MED ORDER — CYCLOBENZAPRINE HCL 10 MG PO TABS: 10.0000 mg | ORAL_TABLET | Freq: Three times a day (TID) | ORAL | Status: DC | PRN

## 2016-10-11 MED ORDER — DEXTROSE 5 % IV SOLN: 500.0000 mg | Freq: Once | INTRAVENOUS | Status: DC

## 2016-10-11 MED ORDER — ENOXAPARIN SODIUM 40 MG/0.4ML ~~LOC~~ SOLN
40.0000 mg | SUBCUTANEOUS | Status: DC
  Administered 2016-10-11 – 2016-10-12 (×2): 40 mg via SUBCUTANEOUS
  Filled 2016-10-11 (×2): qty 0.4

## 2016-10-11 MED ORDER — ALBUTEROL SULFATE (2.5 MG/3ML) 0.083% IN NEBU
2.5000 mg | INHALATION_SOLUTION | RESPIRATORY_TRACT | Status: DC | PRN
  Administered 2016-10-11: 2.5 mg via RESPIRATORY_TRACT
  Filled 2016-10-11 (×2): qty 3

## 2016-10-11 MED ORDER — SODIUM CHLORIDE 0.9 % IV SOLN
INTRAVENOUS | Status: DC
  Administered 2016-10-11: via INTRAVENOUS

## 2016-10-11 MED ORDER — CYCLOBENZAPRINE HCL 10 MG PO TABS: 20.0000 mg | ORAL_TABLET | Freq: Three times a day (TID) | ORAL | Status: DC | PRN

## 2016-10-11 MED ORDER — IPRATROPIUM-ALBUTEROL 0.5-2.5 (3) MG/3ML IN SOLN
3.0000 mL | RESPIRATORY_TRACT | Status: DC
  Administered 2016-10-11 – 2016-10-13 (×10): 3 mL via RESPIRATORY_TRACT
  Filled 2016-10-11 (×10): qty 3

## 2016-10-11 MED ORDER — DILTIAZEM HCL ER COATED BEADS 120 MG PO CP24
120.0000 mg | ORAL_CAPSULE | Freq: Every day | ORAL | Status: DC
  Administered 2016-10-12 – 2016-10-13 (×2): 120 mg via ORAL
  Filled 2016-10-11 (×2): qty 1

## 2016-10-11 MED ORDER — DIPHENHYDRAMINE HCL 25 MG PO CAPS
25.0000 mg | ORAL_CAPSULE | Freq: Four times a day (QID) | ORAL | Status: DC | PRN
  Administered 2016-10-12 – 2016-10-13 (×3): 25 mg via ORAL
  Filled 2016-10-11 (×3): qty 1

## 2016-10-11 MED ORDER — GUAIFENESIN ER 600 MG PO TB12
600.0000 mg | ORAL_TABLET | Freq: Two times a day (BID) | ORAL | Status: DC | PRN
  Administered 2016-10-11 – 2016-10-12 (×2): 600 mg via ORAL
  Filled 2016-10-11 (×3): qty 1

## 2016-10-11 MED ORDER — IOPAMIDOL (ISOVUE-370) INJECTION 76%
100.0000 mL | Freq: Once | INTRAVENOUS | Status: AC | PRN
  Administered 2016-10-11: 100 mL via INTRAVENOUS

## 2016-10-11 MED ORDER — IPRATROPIUM-ALBUTEROL 0.5-2.5 (3) MG/3ML IN SOLN
3.0000 mL | Freq: Once | RESPIRATORY_TRACT | Status: AC
  Administered 2016-10-11: 3 mL via RESPIRATORY_TRACT

## 2016-10-11 MED ORDER — ONDANSETRON HCL 4 MG/2ML IJ SOLN
4.0000 mg | Freq: Once | INTRAMUSCULAR | Status: AC
  Administered 2016-10-11: 4 mg via INTRAVENOUS
  Filled 2016-10-11: qty 2

## 2016-10-11 MED ORDER — PANTOPRAZOLE SODIUM 40 MG PO TBEC
40.0000 mg | DELAYED_RELEASE_TABLET | Freq: Every day | ORAL | Status: DC
  Administered 2016-10-12 – 2016-10-13 (×2): 40 mg via ORAL
  Filled 2016-10-11 (×2): qty 1

## 2016-10-11 MED ORDER — MOMETASONE FURO-FORMOTEROL FUM 200-5 MCG/ACT IN AERO
2.0000 | INHALATION_SPRAY | Freq: Two times a day (BID) | RESPIRATORY_TRACT | Status: DC
  Administered 2016-10-11 – 2016-10-13 (×4): 2 via RESPIRATORY_TRACT
  Filled 2016-10-11: qty 8.8

## 2016-10-11 MED ORDER — CHLORTHALIDONE 25 MG PO TABS
25.0000 mg | ORAL_TABLET | Freq: Every day | ORAL | Status: DC
  Administered 2016-10-12 – 2016-10-13 (×2): 25 mg via ORAL
  Filled 2016-10-11 (×2): qty 1

## 2016-10-11 MED ORDER — PIPERACILLIN-TAZOBACTAM 3.375 G IVPB 30 MIN
3.3750 g | Freq: Once | INTRAVENOUS | Status: AC
  Administered 2016-10-11: 3.375 g via INTRAVENOUS
  Filled 2016-10-11: qty 50

## 2016-10-11 MED ORDER — VANCOMYCIN HCL 10 G IV SOLR
1500.0000 mg | Freq: Once | INTRAVENOUS | Status: AC
  Administered 2016-10-11: 1500 mg via INTRAVENOUS
  Filled 2016-10-11: qty 1500

## 2016-10-11 MED ORDER — IPRATROPIUM-ALBUTEROL 0.5-2.5 (3) MG/3ML IN SOLN
3.0000 mL | Freq: Once | RESPIRATORY_TRACT | Status: AC
  Administered 2016-10-11: 3 mL via RESPIRATORY_TRACT
  Filled 2016-10-11: qty 3

## 2016-10-11 MED ORDER — SODIUM CHLORIDE 0.9 % IV BOLUS (SEPSIS)
1000.0000 mL | Freq: Once | INTRAVENOUS | Status: AC
  Administered 2016-10-11: 1000 mL via INTRAVENOUS

## 2016-10-11 MED ORDER — PIPERACILLIN-TAZOBACTAM 3.375 G IVPB
3.3750 g | Freq: Three times a day (TID) | INTRAVENOUS | Status: DC
  Administered 2016-10-12 – 2016-10-13 (×5): 3.375 g via INTRAVENOUS
  Filled 2016-10-11 (×5): qty 50

## 2016-10-11 MED ORDER — AYR SALINE NASAL NA GEL: 1.0000 "application " | NASAL | Status: DC | PRN

## 2016-10-11 MED ORDER — IPRATROPIUM-ALBUTEROL 0.5-2.5 (3) MG/3ML IN SOLN: 3.0000 mL | Freq: Four times a day (QID) | RESPIRATORY_TRACT | Status: DC

## 2016-10-11 NOTE — ED Notes (Signed)
ED Provider at bedside. 

## 2016-10-11 NOTE — ED Notes (Signed)
Per PA, pt still refusing nursing care.

## 2016-10-11 NOTE — ED Notes (Signed)
Went to get 2nd set of blood cultures. Pt requested a breathing treatment. Pt speaking in full sentences, no obvious wheezing noted. Informed the pt I would need to listen to his lungs prior to getting breathing tx. Pt refused to have RN listen to lungs. Explained I would speak with the provider after leaving the room regarding request for the breathing tx. Pt became argumentative and refused 2nd set of blood cultures. Explained this will make it more difficult to treat the PNA. Pt still refused treatment.  Called PA. PA went and spoke with pt. Pt now refusing all care from RN including abx. PA reports the pt verbalized understanding that this will significantly delay care.

## 2016-10-11 NOTE — H&P (Signed)
History and Physical    [name] ZOX:[id number] DOB: 1965-08-24 DOA: 10/11/2016  PCP: [facility] - [facility] Consultants:  Delton Coombes - pulmonology Patient coming from:  Home - lives alone; NOK: Daughter or girlfriend  Chief Complaint:  SOB  HPI: [name] is a 51 y.o. male with medical history significant of chronic respiratory failure on 4L home O2 due to sarcoidosis and COPD; OSA on CPAP; HTN; and chronic diastolic CHF presenting with SOB.  "It started with the more I tried to breathe in or out it was starting to hurt more, wanted to make sure it wasn't no kind of pneumonia trying to sneak in on me".  SOB and pain with breathing over the last couple of days. He thought he was straining because of coughing - he has chronic cough and is unsure if that has worsened.  He is coughing up greensih sputum which is different.  No fevers.  He wears 5L home O2 on a 50 foot hose, no change.  No sick contacts.   ED Course: Mild tachycardia, increased WOB, wheezing.  CT with LLL PNA and infected bulla.  Refused to allow day nurse to care for him so delay in receiving nebs, IVF, abx.  Review of Systems: As per HPI; otherwise review of systems reviewed and negative.   Ambulatory Status:  Ambulates without assistance  Past Medical History:  Diagnosis Date  . Bronchitis   . CHF (congestive heart failure) (HCC)   . Chronic respiratory failure (HCC)    home O@ 4L (01/28/14)  . Chronic respiratory failure (HCC)   . COPD (chronic obstructive pulmonary disease) (HCC)   . GERD (gastroesophageal reflux disease)   . Hypertension   . Neuromuscular disorder (HCC)   . OSA on CPAP    ramps 5 to 10  . Pneumonia   . Sarcoidosis     Past Surgical History:  Procedure Laterality Date  . arm surgery     . ROTATOR CUFF REPAIR    . VIDEO ASSISTED THORACOSCOPY (VATS)/THOROCOTOMY Left 07/17/2012   Procedure: VIDEO ASSISTED THORACOSCOPY (VATS)/BLEB Stapling;  Surgeon: Alleen Borne, MD;   Location: [facility] OR;  Service: Thoracic;  Laterality: Left;    Social History   Social History  . Marital status: Divorced    Spouse name: N/A  . Number of children: 2  . Years of education: N/A   Occupational History  . Disabled. Retired   Social History Main Topics  . Smoking status: Never Smoker  . Smokeless tobacco: Never Used  . Alcohol use Yes     Comment: social 3-4 times weekly  . Drug use: No  . Sexual activity: No   Other Topics Concern  . Not on file   Social History Narrative   ** Merged History Encounter **       Divorced.  Lives alone.    Allergies  Allergen Reactions  . Clindamycin/Lincomycin Anaphylaxis and Swelling    Made face and tongue swell  . Clindamycin/Lincomycin Swelling    Facial and tongue swelling    Family History  Problem Relation Age of Onset  . Diabetes Father     Prior to Admission medications   Medication Sig Start Date End Date Taking? Authorizing Provider  albuterol (PROVENTIL HFA;VENTOLIN HFA) 108 (90 Base) MCG/ACT inhaler Inhale 2 puffs into the lungs every 6 (six) hours as needed for wheezing or shortness of breath. 04/19/16  Yes Alison Murray, MD  albuterol (PROVENTIL) (2.5 MG/3ML) 0.083% nebulizer solution USE  1 VIAL IN NEBULIZER EVERY 6 HOURS AS NEEDED FOR WHEEZING 09/02/16  Yes [provider]  calcium-vitamin D (OSCAL WITH D) 500-200 MG-UNIT tablet Take 1 tablet by mouth daily.   Yes [provider]  CARTIA XT 120 MG 24 hr capsule Take 120 mg by mouth every morning. 04/16/16  Yes [provider]  chlorthalidone (HYGROTON) 25 MG tablet Take 25 mg by mouth daily. 06/17/16  Yes [provider]  cyclobenzaprine (FLEXERIL) 10 MG tablet Take 20 mg by mouth 3 (three) times daily as needed for muscle spasms.    Yes [provider]  diphenhydrAMINE (BENADRYL) 25 MG tablet Take 25 mg by mouth every 6 (six) hours as needed for itching or allergies.   Yes [provider]  Ergocalciferol  (VITAMIN D2) 400 units TABS Take 400 Units by mouth daily. 03/18/08  Yes [provider]  ferrous sulfate 325 (65 FE) MG tablet Take 325 mg by mouth 2 (two) times daily with a meal.   Yes [provider]  guaiFENesin (MUCINEX) 600 MG 12 hr tablet Take 600 mg by mouth 2 (two) times daily as needed for cough or to loosen phlegm.   Yes [provider]  ipratropium-albuterol (DUONEB) 0.5-2.5 (3) MG/3ML SOLN Take 3 mLs by nebulization every 4 (four) hours as needed. Patient taking differently: Take 3 mLs by nebulization every 4 (four) hours as needed (SOB, wheezing).  04/19/16  Yes Alison Murray, MD  Multiple Vitamins-Minerals (MULTIVITAMIN WITH MINERALS) tablet Take 1 tablet by mouth every morning.    Yes [provider]  omeprazole (PRILOSEC) 40 MG capsule Take 40 mg by mouth daily.    Yes [provider]  ondansetron (ZOFRAN) 4 MG tablet Take 4 mg by mouth every 8 (eight) hours as needed for nausea or vomiting.   Yes [provider]  OXYGEN Inhale 4 L into the lungs continuous.   Yes [provider]  saline (AYR) GEL Place 1 application into the nose every 4 (four) hours as needed (dryness/irritation).   Yes [provider]  SYMBICORT 160-4.5 MCG/ACT inhaler Inhale 1 puff into the lungs 2 (two) times daily. 04/19/16  Yes Alison Murray, MD  VENTOLIN HFA 108 [phone] Base) MCG/ACT inhaler INHALE TWO PUFFS BY MOUTH EVERY 6 HOURS AS NEEDED FOR WHEEZING OR SHORTNESS OF BREATH 09/02/16  Yes Byrum, Les Pou, MD  acetaminophen (TYLENOL) 325 MG tablet Take 2 tablets (650 mg total) by mouth every 6 (six) hours as needed for mild pain (or Fever >/= 101). Patient not taking: Reported on 10/11/2016 04/19/16   Alison Murray, MD  amitriptyline (ELAVIL) 25 MG tablet Take 1 tablet (25 mg total) by mouth at bedtime. Patient not taking: Reported on 10/11/2016 06/21/16   Rolly Salter, MD  predniSONE (DELTASONE) 10 MG tablet Take  daily for 3days,Take   daily for 7 days, Take  daily for 3days, Patient not taking: Reported on 10/11/2016 06/21/16   Rolly Salter, MD    Physical Exam: Vitals:   10/11/16 2100 10/11/16 2115 10/11/16 2133 10/11/16 2357  BP: (!) 135/98  (!) 145/95   Pulse: (!) 107 91 (!) 103   Resp: (!) Temp:   98.7 F (37.1 C)   TempSrc:   Oral   SpO2: 100% (!) 88% 100% 100%  Weight:      Height:   5' 7.5" (1.715 m)      General:  Appears chronically ill and in mild  respiratory distress Eyes:  PERRL, EOMI, normal lids, iris ENT:  grossly normal hearing, lips & tongue, mmm Neck:  no LAD, masses or thyromegaly; no carotid bruits Cardiovascular:  Mild tachycardia, no m/r/g. No LE edema.  Respiratory:   Scattered wheezes with moderate air movement.  Increased respiratory effort with conversation but normal at rest. Abdomen:  soft, NT, ND, NABS Back:   normal alignment, no CVAT Skin:  no rash or induration seen on limited exam Musculoskeletal:  grossly normal tone BUE/BLE, good ROM, no bony abnormality Psychiatric:  grossly normal mood and affect, speech fluent and appropriate, AOx3 Neurologic:  CN 2-12 grossly intact, moves all extremities in coordinated fashion, sensation intact    Radiological Exams on Admission: Dg Chest 2 View  Result Date: 10/11/2016 CLINICAL DATA:  History of sarcoidosis. Chronic decreased oxygen saturation. Chest pain. EXAM: CHEST  2 VIEW COMPARISON:  June 19, 2016 FINDINGS: There is underlying scarring and bullous disease. There is increased opacity in the left lower lobe and right mid lung regions compared to most recent study. There is an apparent air-fluid level within a bulla in the posterior segment of the right upper lobe. There is chronic blunting of the costophrenic angles, likely due to scarring. Heart size is normal. There is chronic distortion of pulmonary vascularity, stable. No adenopathy is evident. No bone lesions. IMPRESSION: Areas of scarring and bullous disease,  stable. There is now an air-fluid level in a posterior segment right upper lobe bulla, raising concern for infected bulla in this area. There is also airspace consolidation in the left lower lobe with an increase in opacity in this area compared to most recent study. Stable cardiac silhouette.  No adenopathy evident. Electronically Signed   By: Bretta Bang III M.D.   On: 10/11/2016 13:22   Ct Angio Chest Pe W And/or Wo Contrast  Result Date: 10/11/2016 CLINICAL DATA:  Increased pleuritic chest pain. Hypoxia. Sarcoidosis. EXAM: CT ANGIOGRAPHY CHEST WITH CONTRAST TECHNIQUE: Multidetector CT imaging of the chest was performed using the standard protocol during bolus administration of intravenous contrast. Multiplanar CT image reconstructions and MIPs were obtained to evaluate the vascular anatomy. CONTRAST:  100 cc Isovue 370 COMPARISON:  Chest CT 05/05/2016 FINDINGS: Cardiovascular: Suboptimal timing for pulmonary arterial contrast. Contrast is much more dense in the aorta and pulmonary veins. The however, the amount of pulmonary arterial contrast is adequate if not ideal. A second contrast 0 since second radiation dose are not thought to be worthwhile. No acute pulmonary embolus. Chronic truncation of the posterior right upper lobe pulmonary arterial branch, likely due to vasoconstriction from hypoaeration related to the large cavitary process, no change from 05/05/2016. No acute aortic findings. Left ventricular hypertrophy, septal thickness 1.9 cm. Mild atherosclerotic calcification of the thoracic aorta. Mediastinum/Nodes: Scattered paratracheal, hilar, and subcarinal adenopathy with scattered prevascular and pericardial lymph nodes overall similar to prior. Many of these lymph nodes are calcified, compatible with sarcoidosis. Subcarinal node 1.9 cm, previously 1.8 cm. Lungs/Pleura: Severe architectural distortion and scattered reticulonodular interstitial opacities. Severe airway thickening in the right  upper lobe common nearly occluded, with a large cavitary process posteriorly in the right upper lobe containing an air-fluid level. The cavitary process is smaller but the air- fluid level is new. Extensive reticulonodular opacities in both lower lobes. Confluent airspace opacities in the left perihilar region with severe proximal bronchial narrowing in the upper lobe. Peribronchovascular nodularity and airway narrowing in the superior segment right lower lobe and in the right middle lobe as well.  This airway narrowing is generally similar to prior exam from April. The left lower lobe airspace opacity is increased significantly in the periphery of the lung, and superimposed pneumonia is a distinct possibility in the left lower lobe. Severe emphysema. Upper Abdomen: Stable upper abdominal lymph nodes. Musculoskeletal: Unremarkable Review of the MIP images confirms the above findings. IMPRESSION: 1. No pulmonary embolus identified. Reduced sensitivity for small emboli due to non ideal bolus timing. 2. Severe architectural distortion in both lungs with extensive findings of sarcoidosis superimposed on emphysema. There is also extensive increase in airspace opacity in the left lower lobe suspicious for superimposed acute pneumonia. 3. The large cavitary lesion posteriorly in the right upper lobe is reduced in size but has a new air-fluid level -this was previously completely air-filled. 4. Scattered adenopathy characteristic of sarcoidosis. 5. Left ventricular hypertrophy. 6. Prominent bilateral airway narrowing, similar to the April 2018 exam. 7. Aortic Atherosclerosis (ICD10-I70.0) and Emphysema (ICD10-J43.9). Electronically Signed   By: Gaylyn Rong M.D.   On: 10/11/2016 17:58    EKG: Independently reviewed.  Sinus tachycardia with rate 110; nonspecific ST changes with no evidence of acute ischemia   Labs on Admission: I have personally reviewed the available labs and imaging studies at the time of the  admission.  Pertinent labs:   VBG: 7.368/58.5/36.2 Lactate 0.93 Troponin 0.00 Hgb 11.8 - improved from 6/18  Assessment/Plan Principal Problem:   Acute on chronic respiratory failure with hypoxia (HCC) Active Problems:   OSA on CPAP   Normocytic anemia   Chronic pain   Sarcoidosis   Hypertension   Acute on chronic respiratory failure resulting from LLL pneumonia as well as sarcoidosis, COPD -Patient with marked chronic respiratory failure requiring 4-5L home O2 -Now with acute worsening/SOB -CT shows infiltrate in left lower lobe concerning for superimposed pneumonia.  -Patient also appears to have a new air-fluid level in a bulla concerning for infection. -Influenza pending. -CURB-65 score is 0 - will admit the patient to telemetry given persistent tachycardia. -Pneumonia Severity Index (PSI) is Class 2, 1% mortality; however, this does not take into account his chronic underlying lung disease. -Will start Vanc/Zosyn for now. -Additional complicating factors include: hypoxia/hypoxemia. -If he is thought to have an infected bulla, he will need longer-term antibiotics - 2-6 weeks depending on his response and based on repeat imaging.  Further, if he is not responding to treatment then this raises concern for a fungal infection. -NS @ 75cc/hr -Fever control -Repeat CBC in am -Sputum cultures -Blood cultures -Strep pneumo testing -Legionella testing -Will order lower respiratory tract procalcitonin level.  Antibiotics would not be indicated for PCT <0.1 and probably should not be used for < 0.25.  >0.5 indicates infection and >>0.5 indicates more serious disease.  As the procalcitonin level normalizes, it will be reasonable to consider de-escalation of antibiotic coverage. -albuterol PRN -Standing Duonebs -mucinex -h/o cocaine abuse, will check UDS -His daughter has requested a palliative care consult, as she would like to have him considered for hospice.  OSA -Continue  CPAP  Anemia -Appears to be stable, will follow  HTN -Continue Chlorthalidone and Diltizaem  DVT prophylaxis: Lovenox Code Status:  Full - confirmed with patient Family Communication: None present  Disposition Plan:  Home once clinically improved Consults called: Palliative care  Admission status: Admit - It is my clinical opinion that admission to INPATIENT is reasonable and necessary because this patient will require at least 2 midnights in the hospital to treat this condition based on the  medical complexity of the problems presented.  Given the aforementioned information, the predictability of an adverse outcome is felt to be significant.     Jonah Blue MD [facility]  If note is complete, please contact covering daytime or nighttime physician. [email or web address] Password [email or web address]  10/12/2016, 12:38 AM

## 2016-10-11 NOTE — Progress Notes (Signed)
Pharmacy Antibiotic Note  [name] is a 51 y.o. male admitted on 10/11/2016 with pneumonia.  Pharmacy has been consulted for vancomycin/Zosyn dosing.  Plan: Zosyn 3.375g IV q8h (4 hour infusion).  Vancomycin 1500 mg IV x1, then 1000 mg IV q12h Daily SCr Monitor clinical course, renal function, cultures as available   Height: 5' 7.5" (171.5 cm) Weight: 172 lb (78 kg) IBW/kg (Calculated) : 67.25  Temp (24hrs), Avg:98.4 F (36.9 C), Min:98.1 F (36.7 C), Max:98.6 F (37 C)   Recent Labs Lab 10/11/16 1541 10/11/16 1550  WBC 5.8  --   CREATININE 0.94  --   LATICACIDVEN  --  0.93    Estimated Creatinine Clearance: 88.5 mL/min (by C-G formula based on SCr of 0.94 mg/dL).    Allergies  Allergen Reactions  . Clindamycin/Lincomycin Anaphylaxis and Swelling    Made face and tongue swell  . Clindamycin/Lincomycin Swelling    Facial and tongue swelling    Antimicrobials this admission: 10/1 vancomycin >>  10/1 Zosyn >>   Dose adjustments this admission: ---  Microbiology results: 10/1 BCx: sent 10/1 Sputum: sent    Thank you for allowing pharmacy to be a part of this patient's care.  Adalberto Cole, PharmD, BCPS Pager [phone] 10/11/2016 8:17 PM

## 2016-10-11 NOTE — ED Provider Notes (Signed)
[facility]-EMERGENCY DEPT Provider Note   CSN: [id number] Arrival date & time: 10/11/16  1037     History   Chief Complaint Chief Complaint  Patient presents with  . Shortness of Breath    HPI [name] is a 51 y.o. male presenting with chest pain and shortness of breath.  Patient states that for the past several days, he's had worsening left-sided chest pain. Pain is worse with deep inspiration, cough, and movement. Nothing has made it better. It is described as feeling sharp. He reports associated cough, with green mucus production, and nausea without vomiting. Symptoms have been present for the past 4 days. He denies fever, chills, sore throat, nasal congestion, vomiting, abdominal pain, urinary symptoms, normal bowel movements. He denies leg pain or swelling. He wears 4 L oxygen at home at baseline, has not had to increase this recently. Pulmonary history significant for COPD and sarcoidosis. He denies sick contacts. He was last hospitalized in June, no antibiotics since 06/15 (keflex). He has been using his medicines as prescribed, without any recent changes. He last used his medicines, including nebs and inhalers, yesterday.   HPI  Past Medical History:  Diagnosis Date  . Bronchitis   . CHF (congestive heart failure) (HCC)   . Chronic respiratory failure (HCC)    home O@ 4L (01/28/14)  . Chronic respiratory failure (HCC)   . COPD (chronic obstructive pulmonary disease) (HCC)   . GERD (gastroesophageal reflux disease)   . Hypertension   . Neuromuscular disorder (HCC)   . OSA on CPAP    ramps 5 to 10  . Pneumonia   . Sarcoidosis     Patient Active Problem List   Diagnosis Date Noted  . Erythema nodosum, acute form 06/20/2016  . Cellulitis 06/20/2016  . Cellulitis of left anterior lower leg   . Acute respiratory failure with hypoxia (HCC) 04/16/2016  . Sarcoidosis 01/13/2016  . Hypertension 01/13/2016  . Chronic respiratory failure (HCC) 01/13/2016  . Cocaine abuse  (HCC) 01/13/2016  . Respiratory distress 01/12/2016  . Epistaxis 09/19/2015  . Hyperkalemia   . COPD exacerbation (HCC) 07/21/2015  . HCAP (healthcare-associated pneumonia) 01/13/2015  . SOB (shortness of breath) 03/23/2014  . Essential hypertension   . Chronic respiratory failure with hypoxia (HCC) 02/11/2014  . Neuropathy 01/26/2014  . Abnormal bruising 01/23/2014  . Foot pain, bilateral 01/23/2014  . Discoloration of skin of toe 01/23/2014  . Acute on chronic respiratory failure with hypoxia (HCC) 01/17/2014  . Chronic pain 11/23/2013  . Abdominal pain, epigastric 08/03/2012  . Nuclear sclerotic cataract 07/06/2012  . Normocytic anemia 06/16/2012  . GERD (gastroesophageal reflux disease) 06/16/2012  . Bronchiectasis without acute exacerbation (HCC) 03/05/2012  . Hyperglycemia 07/27/2011  . Error, refractive, myopia 02/15/2011  . Sarcoid (HCC) 01/15/2011  . OSA on CPAP 01/15/2011    Past Surgical History:  Procedure Laterality Date  . arm surgery     . ROTATOR CUFF REPAIR    . VIDEO ASSISTED THORACOSCOPY (VATS)/THOROCOTOMY Left 07/17/2012   Procedure: VIDEO ASSISTED THORACOSCOPY (VATS)/BLEB Stapling;  Surgeon: Alleen Borne, MD;  Location: [facility] OR;  Service: Thoracic;  Laterality: Left;       Home Medications    Prior to Admission medications   Medication Sig Start Date End Date Taking? Authorizing Provider  albuterol (PROVENTIL HFA;VENTOLIN HFA) 108 (90 Base) MCG/ACT inhaler Inhale 2 puffs into the lungs every 6 (six) hours as needed for wheezing or shortness of breath. 04/19/16  Yes Alison Murray, MD  albuterol (  PROVENTIL) (2.5 MG/3ML) 0.083% nebulizer solution USE 1 VIAL IN NEBULIZER EVERY 6 HOURS AS NEEDED FOR WHEEZING 09/02/16  Yes [provider]  calcium-vitamin D (OSCAL WITH D) 500-200 MG-UNIT tablet Take 1 tablet by mouth daily.   Yes [provider]  CARTIA XT 120 MG 24 hr capsule Take 120 mg by mouth every morning. 04/16/16  Yes [provider]  chlorthalidone (HYGROTON) 25 MG tablet Take 25 mg by mouth daily. 06/17/16  Yes [provider]  cyclobenzaprine (FLEXERIL) 10 MG tablet Take 20 mg by mouth 3 (three) times daily as needed for muscle spasms.    Yes [provider]  diphenhydrAMINE (BENADRYL) 25 MG tablet Take 25 mg by mouth every 6 (six) hours as needed for itching or allergies.   Yes [provider]  Ergocalciferol (VITAMIN D2) 400 units TABS Take 400 Units by mouth daily. 03/18/08  Yes [provider]  ferrous sulfate 325 (65 FE) MG tablet Take 325 mg by mouth 2 (two) times daily with a meal.   Yes [provider]  guaiFENesin (MUCINEX) 600 MG 12 hr tablet Take 600 mg by mouth 2 (two) times daily as needed for cough or to loosen phlegm.   Yes [provider]  ipratropium-albuterol (DUONEB) 0.5-2.5 (3) MG/3ML SOLN Take 3 mLs by nebulization every 4 (four) hours as needed. Patient taking differently: Take 3 mLs by nebulization every 4 (four) hours as needed (SOB, wheezing).  04/19/16  Yes Alison Murray, MD  Multiple Vitamins-Minerals (MULTIVITAMIN WITH MINERALS) tablet Take 1 tablet by mouth every morning.    Yes [provider]  omeprazole (PRILOSEC) 40 MG capsule Take 40 mg by mouth daily.    Yes [provider]  ondansetron (ZOFRAN) 4 MG tablet Take 4 mg by mouth every 8 (eight) hours as needed for nausea or vomiting.   Yes [provider]  OXYGEN Inhale 4 L into the lungs continuous.   Yes [provider]  saline (AYR) GEL Place 1 application into the nose every 4 (four) hours as needed (dryness/irritation).   Yes [provider]  SYMBICORT 160-4.5 MCG/ACT inhaler Inhale 1 puff into the lungs 2 (two) times daily. 04/19/16  Yes Alison Murray, MD    Family History Family History  Problem Relation Age of Onset  . Diabetes Father     Social History Social History  Substance Use Topics  . Smoking status: Never Smoker    . Smokeless tobacco: Never Used  . Alcohol use Yes     Comment: social 3-4 times weekly     Allergies   Clindamycin/lincomycin and Clindamycin/lincomycin   Review of Systems Review of Systems  Respiratory: Positive for cough and shortness of breath.   Cardiovascular: Positive for chest pain.  Gastrointestinal: Positive for nausea.  All other systems reviewed and are negative.    Physical Exam Updated Vital Signs BP (!) 135/111   Pulse (!) 108   Temp 98.1 F (36.7 C) (Oral)   Resp 13   Ht 5' 7.5" (1.715 m)   Wt 78 kg (172 lb)   SpO2 98% Comment: Simultaneous filing. User may not have seen previous data.  BMI 26.54 kg/m   Physical Exam  Constitutional: He is oriented to person, place, and time. He appears well-developed and well-nourished. No distress.  HENT:  Head: Normocephalic and atraumatic.  Right Ear: Tympanic membrane, external ear and ear canal normal.  Left Ear: Tympanic membrane, external ear and ear canal normal.  Mouth/Throat:  Uvula is midline, oropharynx is clear and moist and mucous membranes are normal.  Eyes: Pupils are equal, round, and reactive to light. Conjunctivae and EOM are normal.  Neck: Normal range of motion.  Cardiovascular: Regular rhythm and intact distal pulses.  Tachycardia present.   Pulmonary/Chest: Effort normal. No respiratory distress. He has decreased breath sounds in the left lower field. He has wheezes. He has no rhonchi. He has no rales.  Slightly decreased breath sounds left side. Scattered wheezes throughout all lung fields. No tenderness to palpation of the chest wall. No obvious deformities, bruising, or rash.  Abdominal: Soft. Bowel sounds are normal. He exhibits no distension. There is no tenderness. There is no rigidity, no rebound and no guarding.  Musculoskeletal: Normal range of motion.  No leg pain or swelling. Pedal pulses intact bilaterally.  Neurological: He is alert and oriented to person, place, and time.  Skin:  Skin is warm and dry.  Patient bilateral dry skin around his feet and ankles.  Psychiatric: He has a normal mood and affect.  Nursing note and vitals reviewed.    ED Treatments / Results  Labs (all labs ordered are listed, but only abnormal results are displayed) Labs Reviewed  CBC WITH DIFFERENTIAL/PLATELET - Abnormal; Notable for the following:       Result Value   Hemoglobin 11.8 (*)    HCT 37.4 (*)    MCH 25.6 (*)    All other components within normal limits  COMPREHENSIVE METABOLIC PANEL - Abnormal; Notable for the following:    Chloride 98 (*)    ALT 16 (*)    All other components within normal limits  BLOOD GAS, VENOUS - Abnormal; Notable for the following:    Bicarbonate 32.9 (*)    Acid-Base Excess 6.5 (*)    All other components within normal limits  CULTURE, BLOOD (ROUTINE X 2)  CULTURE, BLOOD (ROUTINE X 2)  I-STAT TROPONIN, ED  I-STAT CG4 LACTIC ACID, ED  CG4 I-STAT (LACTIC ACID)  POCT I-STAT TROPONIN I    EKG  EKG Interpretation  Date/Time:  Monday October 11 2016 10:52:33 EDT Ventricular Rate:  110 PR Interval:    QRS Duration: 79 QT Interval:  346 QTC Calculation: 468 R Axis:   91 Text Interpretation:  Sinus tachycardia Atrial premature complex LAE, consider biatrial enlargement Borderline right axis deviation Borderline low voltage, extremity leads Baseline wander in lead(s) V5 No significant change since last tracing Confirmed by Richardean Canal [phone]) on 10/11/2016 3:32:13 PM       Radiology Dg Chest 2 View  Result Date: 10/11/2016 CLINICAL DATA:  History of sarcoidosis. Chronic decreased oxygen saturation. Chest pain. EXAM: CHEST  2 VIEW COMPARISON:  June 19, 2016 FINDINGS: There is underlying scarring and bullous disease. There is increased opacity in the left lower lobe and right mid lung regions compared to most recent study. There is an apparent air-fluid level within a bulla in the posterior segment of the right upper lobe. There is chronic  blunting of the costophrenic angles, likely due to scarring. Heart size is normal. There is chronic distortion of pulmonary vascularity, stable. No adenopathy is evident. No bone lesions. IMPRESSION: Areas of scarring and bullous disease, stable. There is now an air-fluid level in a posterior segment right upper lobe bulla, raising concern for infected bulla in this area. There is also airspace consolidation in the left lower lobe with an increase in opacity in this area compared to most recent study. Stable cardiac silhouette.  No adenopathy evident. Electronically Signed   By: Bretta Bang III M.D.   On: 10/11/2016 13:22   Ct Angio Chest Pe W And/or Wo Contrast  Result Date: 10/11/2016 CLINICAL DATA:  Increased pleuritic chest pain. Hypoxia. Sarcoidosis. EXAM: CT ANGIOGRAPHY CHEST WITH CONTRAST TECHNIQUE: Multidetector CT imaging of the chest was performed using the standard protocol during bolus administration of intravenous contrast. Multiplanar CT image reconstructions and MIPs were obtained to evaluate the vascular anatomy. CONTRAST:  100 cc Isovue 370 COMPARISON:  Chest CT 05/05/2016 FINDINGS: Cardiovascular: Suboptimal timing for pulmonary arterial contrast. Contrast is much more dense in the aorta and pulmonary veins. The however, the amount of pulmonary arterial contrast is adequate if not ideal. A second contrast 0 since second radiation dose are not thought to be worthwhile. No acute pulmonary embolus. Chronic truncation of the posterior right upper lobe pulmonary arterial branch, likely due to vasoconstriction from hypoaeration related to the large cavitary process, no change from 05/05/2016. No acute aortic findings. Left ventricular hypertrophy, septal thickness 1.9 cm. Mild atherosclerotic calcification of the thoracic aorta. Mediastinum/Nodes: Scattered paratracheal, hilar, and subcarinal adenopathy with scattered prevascular and pericardial lymph nodes overall similar to prior. Many of  these lymph nodes are calcified, compatible with sarcoidosis. Subcarinal node 1.9 cm, previously 1.8 cm. Lungs/Pleura: Severe architectural distortion and scattered reticulonodular interstitial opacities. Severe airway thickening in the right upper lobe common nearly occluded, with a large cavitary process posteriorly in the right upper lobe containing an air-fluid level. The cavitary process is smaller but the air- fluid level is new. Extensive reticulonodular opacities in both lower lobes. Confluent airspace opacities in the left perihilar region with severe proximal bronchial narrowing in the upper lobe. Peribronchovascular nodularity and airway narrowing in the superior segment right lower lobe and in the right middle lobe as well. This airway narrowing is generally similar to prior exam from April. The left lower lobe airspace opacity is increased significantly in the periphery of the lung, and superimposed pneumonia is a distinct possibility in the left lower lobe. Severe emphysema. Upper Abdomen: Stable upper abdominal lymph nodes. Musculoskeletal: Unremarkable Review of the MIP images confirms the above findings. IMPRESSION: 1. No pulmonary embolus identified. Reduced sensitivity for small emboli due to non ideal bolus timing. 2. Severe architectural distortion in both lungs with extensive findings of sarcoidosis superimposed on emphysema. There is also extensive increase in airspace opacity in the left lower lobe suspicious for superimposed acute pneumonia. 3. The large cavitary lesion posteriorly in the right upper lobe is reduced in size but has a new air-fluid level -this was previously completely air-filled. 4. Scattered adenopathy characteristic of sarcoidosis. 5. Left ventricular hypertrophy. 6. Prominent bilateral airway narrowing, similar to the April 2018 exam. 7. Aortic Atherosclerosis (ICD10-I70.0) and Emphysema (ICD10-J43.9). Electronically Signed   By: Gaylyn Rong M.D.   On: 10/11/2016  17:58    Procedures Procedures (including critical care time)  Medications Ordered in ED Medications  iopamidol (ISOVUE-370) 76 % injection (not administered)  albuterol (PROVENTIL) (2.5 MG/3ML) 0.083% nebulizer solution 2.5 mg (not administered)  ondansetron (ZOFRAN) injection 4 mg (not administered)  sodium chloride 0.9 % bolus 1,000 mL (0 mLs Intravenous Stopped 10/11/16 1756)  ipratropium-albuterol (DUONEB) 0.5-2.5 (3) MG/3ML nebulizer solution 3 mL (3 mLs Nebulization Given 10/11/16 0700)  ipratropium-albuterol (DUONEB) 0.5-2.5 (3) MG/3ML nebulizer solution 3 mL (3 mLs Nebulization Given 10/11/16 1711)  iopamidol (ISOVUE-370) 76 % injection 100 mL (100 mLs Intravenous Contrast Given 10/11/16 1709)     Initial Impression /  Assessment and Plan / ED Course  I have reviewed the triage vital signs and the nursing notes.  Pertinent labs & imaging results that were available during my care of the patient were reviewed by me and considered in my medical decision making (see chart for details).    Clinical Course as of Oct 12 1947  Mon Oct 11, 2016  1649 Patient is very agitated, stating he needs a breathing treatment. He would not let the nurse listen to his lungs. I discussed this with patient that we needed to assess his lungs prior so we can make sure the breathing treatment is working. He allowed me to listen to his lungs, which had continued scattered wheezes. Will order a breathing treatment. Patient states he does not want his nurse taking care of him. I discussed that there are no other options available, and he states he will go without his medicines or labs so long as he does not get treatment from her. Pt is unable to say why he does not want tx from her, and states he will leave if she tries to give him anything.  BCx2 and abx will be held until after the breathing treatment and another discussion with the pt.   [[place]]  1751 On reassessment, patient lung sounds stable, no change in  scattered wheezing. Patient continuing to refuse antibiotics and blood cultures until he has a different nurse.  [[place]]    Clinical Course User Index [[place]] Zema Lizardo, PA-C     Patient presented with several day history of left-sided chest pain and worsening cough. Significant pulmonary history including COPD and sarcoidosis. Patient is slightly tachycardic, afebrile, sats good on baseline oxygen. Slight decrease in lung sounds in the left lower field, exam otherwise reassuring. No swelling or pain of lower legs. X-ray shows left lower lobe opacity and possible infected bulla. Will order labs, and CT to rule out PE and for further evaluation of the lungs. Case discussed with attending, Dr. Silverio Lay evaluated the patient. Will start IV fluids and antibiotics.  Basic labs reassuring, as patient without leukocytosis. Lactate negative. EKG shows tachycardia, otherwise unchanged from prior. CTA negative for PE, and shows possible left lower lobe pneumonia. Considering patient's significant pulmonary history, will contact hospitalist for admission.  Discussed case with Dr. [name], patient be admitted.    Final Clinical Impressions(s) / ED Diagnoses   Final diagnoses:  Pneumonia of left lower lobe due to infectious organism [facility])    New Prescriptions New Prescriptions   No medications on file     Alveria Apley, PA-C 10/11/16 1950    Charlynne Pander, MD 10/12/16 [phone]

## 2016-10-11 NOTE — Progress Notes (Signed)
Please call report to [place] at [phone]. [phone]

## 2016-10-11 NOTE — ED Triage Notes (Signed)
Patient c/o increased pain when he breathes in and rates 9/10/. Patient normally wears O2 4L/min via 

## 2016-10-12 LAB — BASIC METABOLIC PANEL
Anion gap: 7 (ref 5–15)
BUN: 11 mg/dL (ref 6–20)
CALCIUM: 8.6 mg/dL — AB (ref 8.9–10.3)
CO2: 29 mmol/L (ref 22–32)
CREATININE: 1 mg/dL (ref 0.61–1.24)
Chloride: 103 mmol/L (ref 101–111)
GFR calc Af Amer: >60 mL/min (ref >60)
GFR calc non Af Amer: >60 mL/min (ref >60)
GLUCOSE: 112 mg/dL — AB (ref 65–99)
Potassium: 4 mmol/L (ref 3.5–5.1)
Sodium: 139 mmol/L (ref 135–145)

## 2016-10-12 LAB — RAPID URINE DRUG SCREEN, HOSP PERFORMED
AMPHETAMINES: NOT DETECTED
BARBITURATES: NOT DETECTED
Benzodiazepines: NOT DETECTED
Cocaine: POSITIVE — AB
Opiates: NOT DETECTED
TETRAHYDROCANNABINOL: NOT DETECTED

## 2016-10-12 LAB — CBC WITH DIFFERENTIAL/PLATELET
BASOS PCT: 0 %
Basophils Absolute: 0 10*3/uL (ref 0.0–0.1)
EOS ABS: 0.6 10*3/uL (ref 0.0–0.7)
Eosinophils Relative: 11 %
HCT: 34.2 % — ABNORMAL LOW (ref 39.0–52.0)
Hemoglobin: 10.9 g/dL — ABNORMAL LOW (ref 13.0–17.0)
Lymphocytes Relative: 19 %
Lymphs Abs: 1 10*3/uL (ref 0.7–4.0)
MCH: 25.5 pg — AB (ref 26.0–34.0)
MCHC: 31.9 g/dL (ref 30.0–36.0)
MCV: 79.9 fL (ref 78.0–100.0)
MONO ABS: 0.4 10*3/uL (ref 0.1–1.0)
MONOS PCT: 8 %
Neutro Abs: 3.4 10*3/uL (ref 1.7–7.7)
Neutrophils Relative %: 62 %
Platelets: 245 10*3/uL (ref 150–400)
RBC: 4.28 MIL/uL (ref 4.22–5.81)
RDW: 12.4 % (ref 11.5–15.5)
WBC: 5.5 10*3/uL (ref 4.0–10.5)

## 2016-10-12 LAB — EXPECTORATED SPUTUM ASSESSMENT W REFEX TO RESP CULTURE: SPECIAL REQUESTS: NORMAL

## 2016-10-12 LAB — INFLUENZA PANEL BY PCR (TYPE A & B)
INFLAPCR: NEGATIVE
INFLBPCR: NEGATIVE

## 2016-10-12 LAB — STREP PNEUMONIAE URINARY ANTIGEN: Strep Pneumo Urinary Antigen: NEGATIVE

## 2016-10-12 LAB — PROCALCITONIN

## 2016-10-12 LAB — MRSA PCR SCREENING: MRSA by PCR: NEGATIVE

## 2016-10-12 MED ORDER — ACETAMINOPHEN 325 MG PO TABS
650.0000 mg | ORAL_TABLET | Freq: Four times a day (QID) | ORAL | Status: DC | PRN
  Administered 2016-10-12 (×2): 650 mg via ORAL
  Filled 2016-10-12 (×2): qty 2

## 2016-10-12 MED ORDER — VANCOMYCIN HCL IN DEXTROSE 750-5 MG/150ML-% IV SOLN
750.0000 mg | Freq: Three times a day (TID) | INTRAVENOUS | Status: DC
  Administered 2016-10-12 (×2): 750 mg via INTRAVENOUS
  Filled 2016-10-12 (×3): qty 150

## 2016-10-12 MED ORDER — KETOROLAC TROMETHAMINE 30 MG/ML IJ SOLN
30.0000 mg | Freq: Four times a day (QID) | INTRAMUSCULAR | Status: DC | PRN
  Administered 2016-10-12 – 2016-10-13 (×4): 30 mg via INTRAVENOUS
  Filled 2016-10-12 (×4): qty 1

## 2016-10-12 NOTE — Progress Notes (Signed)
Initial Nutrition Assessment  DOCUMENTATION CODES:   Not applicable  INTERVENTION:   - Continue to encourage intake at meals  NUTRITION DIAGNOSIS:   Increased nutrient needs related to chronic illness (COPD) as evidenced by estimated needs.  GOAL:   Patient will meet greater than or equal to 90% of their needs  MONITOR:   PO intake, Weight trends, Labs, I & O's  REASON FOR ASSESSMENT:   Malnutrition Screening Tool    ASSESSMENT:   51 year old male admitted for SOB and pain with breathing. PMH of Respiratory Failure with hypoxia, sarcoidosis, COPD, OSA on CPAP, HTN, CHF, and GERD.   Pt did not eat breakfast this morning. Reports a good appetite at home. Pt eats a variety of foods for meals and typically eats 3x/day. Pt reports "not weighing himself" but does not think his weight has changed recently. Has no issues chewing or swallowing d/t SOB.   Medications: Protonix, Lovenox  Labs: Glucose 112 (H), Ca 8.6 (L)  Nutrition Focused Physical Exam Findings: Pt refused exam at this time.  Diet Order:  Diet regular Room service appropriate? Yes; Fluid consistency: Thin  Skin:  Reviewed, no issues  Last BM:  10/11/2016  Height:   Ht Readings from Last 1 Encounters:  10/11/16 5' 7.5" (1.715 m)    Weight:   Wt Readings from Last 1 Encounters:  10/11/16 172 lb (78 kg)    Ideal Body Weight:  68.6 kg  BMI:  Body mass index is 26.54 kg/m.  Estimated Nutritional Needs:   Kcal:  2000-2200 kcals  Protein:  100-110 grams protein  Fluid:  >/= 2 L  EDUCATION NEEDS:   No education needs identified at this time  Wynetta Emery [facility] Dietetic Intern Pager: [phone] 10/12/2016 11:46 AM

## 2016-10-12 NOTE — Progress Notes (Signed)
Pharmacy Antibiotic Note  [name] is a 51 y.o. male admitted on 10/11/2016 with pneumonia.  Pharmacy has been consulted for Vancomycin and Zosyn dosing.  Plan: Continue Zosyn 3.375g IV q8h (4 hour infusion).  Adjust Vancomycin to  IV q8h based on current renal function and prior dosing history/levels.  Daily SCr due to increased risk of nephrotoxicity with above regimen + ketorolac.  Monitor clinical course, renal function, cultures.  F/u MRSA PCR for ability to de-escalate antibiotic therapy.     Height: 5' 7.5" (171.5 cm) Weight: 172 lb (78 kg) IBW/kg (Calculated) : 67.25  Temp (24hrs), Avg:98.3 F (36.8 C), Min:97.8 F (36.6 C), Max:98.7 F (37.1 C)   Recent Labs Lab 10/11/16 1541 10/11/16 1550 10/12/16 0158  WBC 5.8  --  5.5  CREATININE 0.94  --  1.00  LATICACIDVEN  --  0.93  --     Estimated Creatinine Clearance: 83.2 mL/min (by C-G formula based on SCr of 1 mg/dL).    Allergies  Allergen Reactions  . Clindamycin/Lincomycin Anaphylaxis and Swelling    Made face and tongue swell  . Clindamycin/Lincomycin Swelling    Facial and tongue swelling    Antimicrobials this admission: 10/1 Vancomycin >>  10/1 Zosyn >>    Microbiology results: 10/1 BCx: sent 10/2 Influenza PCR: negative Sputum: ordered Legionella (sputum): ordered MRSA PCR: ordered   Thank you for allowing pharmacy to be a part of this patient's care.   Greer Pickerel, PharmD, BCPS Pager: [phone] 10/12/2016 7:37 AM

## 2016-10-12 NOTE — Progress Notes (Signed)
Patient will self administer cpap when ready 

## 2016-10-12 NOTE — Progress Notes (Signed)
PROGRESS NOTE    [name]  WUX:[id number] DOB: 08/12/1965 DOA: 10/11/2016 PCP: [facility]    Brief Narrative:  51 y.o. male with medical history significant of chronic respiratory failure on 4L home O2 due to sarcoidosis and COPD; OSA on CPAP; HTN; and chronic diastolic CHF presenting with SOB.  "It started with the more I tried to breathe in or out it was starting to hurt more, wanted to make sure it wasn't no kind of pneumonia trying to sneak in on me".  SOB and pain with breathing over the last couple of days. He thought he was straining because of coughing - he has chronic cough and is unsure if that has worsened.  He is coughing up greensih sputum which is different.  No fevers.  He wears 5L home O2 on a 50 foot hose, no change.  No sick contacts.  Assessment & Plan:   Principal Problem:   Acute on chronic respiratory failure with hypoxia (HCC) Active Problems:   OSA on CPAP   Normocytic anemia   Sarcoidosis   Hypertension  Acute on chronic respiratory failure resulting from LLL pneumonia as well as sarcoidosis, COPD -Patient with marked chronic respiratory failure requiring 4-5L home O2 -Now with acute worsening/SOB -CT shows infiltrate in left lower lobe concerning for superimposed pneumonia. -Patient also appears to have a new air-fluid level in a bulla concerning for infection. -Influenza neg -MRSA screen pending -Will start Vanc/Zosyn for now. If MRSA neg, would d/c vanc -Sputum cultures pending -Blood cultures -Strep pneumo neg -Legionella serologies pending  OSA -Continue CPAP as tolerated -Stable at this time  Anemia -Appears to be stable, will follow -Repeat cbc in  AM  HTN -Continue Chlorthalidone and Diltizaem -currently stable  Cocaine abuse -cessation done -UDS pos for cocaine  DVT prophylaxis: Lovenox subQ Code Status: Full Family Communication: Pt in room, family not at bedside Disposition Plan: Uncertain at this  time  Consultants:     Procedures:     Antimicrobials: Anti-infectives    Start     Dose/Rate Route Frequency Ordered Stop   10/12/16 0900  vancomycin (VANCOCIN) IVPB 1000 mg/200 mL premix  Status:  Discontinued     1,000 mg 200 mL/hr over 60 Minutes Intravenous Every 12 hours 10/11/16 2004 10/12/16 0735   10/12/16 0900  vancomycin (VANCOCIN) IVPB 750 mg/150 ml premix     750 mg 150 mL/hr over 60 Minutes Intravenous Every 8 hours 10/12/16 0735     10/12/16 0230  piperacillin-tazobactam (ZOSYN) IVPB 3.375 g     3.375 g 12.5 mL/hr over 240 Minutes Intravenous Every 8 hours 10/11/16 2004     10/11/16 2015  vancomycin (VANCOCIN) 1,500 mg in sodium chloride 0.9 % 500 mL IVPB     1,500 mg 250 mL/hr over 120 Minutes Intravenous  Once 10/11/16 2004 10/11/16 2318   10/11/16 2015  piperacillin-tazobactam (ZOSYN) IVPB 3.375 g     3.375 g 100 mL/hr over 30 Minutes Intravenous  Once 10/11/16 2004 10/11/16 2118   10/11/16 1545  cefTRIAXone (ROCEPHIN) 1 g in dextrose 5 % 50 mL IVPB  Status:  Discontinued     1 g 100 mL/hr over 30 Minutes Intravenous  Once 10/11/16 1531 10/11/16 1939   10/11/16 1545  azithromycin (ZITHROMAX) 500 mg in dextrose 5 % 250 mL IVPB  Status:  Discontinued     500 mg 250 mL/hr over 60 Minutes Intravenous  Once 10/11/16 1531 10/11/16 1939       Subjective:  Reports feeling about the same overall  Objective: Vitals:   10/11/16 2357 10/12/16 0343 10/12/16 0611 10/12/16 0745  BP:   (!) 110/95   Pulse:   98   Resp:   18   Temp:   97.8 F (36.6 C)   TempSrc:   Oral   SpO2: 100% 99% 100% 92%  Weight:      Height:        Intake/Output Summary (Last 24 hours) at 10/12/16 1317 Last data filed at 10/12/16 1100  Gross per 24 hour  Intake           2132.5 ml  Output              800 ml  Net           1332.5 ml   Filed Weights   10/11/16 1144  Weight: 78 kg (172 lb)    Examination: General exam: Awake, laying in bed, in nad Respiratory system: Normal  respiratory effort, end-expiratory wheezing throughout Cardiovascular system: regular rate, s1, s2 Gastrointestinal system: Soft, nondistended, positive BS Central nervous system: CN2-12 grossly intact, strength intact Extremities: Perfused, no clubbing Skin: Normal skin turgor, no notable skin lesions seen Psychiatry: Mood normal // no visual hallucinations   Data Reviewed: I have personally reviewed following labs and imaging studies  CBC:  Recent Labs Lab 10/11/16 1541 10/12/16 0158  WBC 5.8 5.5  NEUTROABS 3.5 3.4  HGB 11.8* 10.9*  HCT 37.4* 34.2*  MCV 81.1 79.9  PLT 301 245   Basic Metabolic Panel:  Recent Labs Lab 10/11/16 1541 10/12/16 0158  NA 140 139  K 3.7 4.0  CL 98* 103  CO2 32 29  GLUCOSE 98 112*  BUN 12 11  CREATININE 0.94 1.00  CALCIUM 9.1 8.6*   GFR: Estimated Creatinine Clearance: 83.2 mL/min (by C-G formula based on SCr of 1 mg/dL). Liver Function Tests:  Recent Labs Lab 10/11/16 1541  AST 31  ALT 16*  ALKPHOS 95  BILITOT 0.5  PROT 7.3  ALBUMIN 3.6   No results for input(s): LIPASE, AMYLASE in the last 168 hours. No results for input(s): AMMONIA in the last 168 hours. Coagulation Profile: No results for input(s): INR, PROTIME in the last 168 hours. Cardiac Enzymes: No results for input(s): CKTOTAL, CKMB, CKMBINDEX, TROPONINI in the last 168 hours. BNP (last 3 results) No results for input(s): PROBNP in the last 8760 hours. HbA1C: No results for input(s): HGBA1C in the last 72 hours. CBG: No results for input(s): GLUCAP in the last 168 hours. Lipid Profile: No results for input(s): CHOL, HDL, LDLCALC, TRIG, CHOLHDL, LDLDIRECT in the last 72 hours. Thyroid Function Tests: No results for input(s): TSH, T4TOTAL, FREET4, T3FREE, THYROIDAB in the last 72 hours. Anemia Panel: No results for input(s): VITAMINB12, FOLATE, FERRITIN, TIBC, IRON, RETICCTPCT in the last 72 hours. Sepsis Labs:  Recent Labs Lab 10/11/16 1550 10/12/16 0158    PROCALCITON  --  <0.10  LATICACIDVEN 0.93  --     No results found for this or any previous visit (from the past 240 hour(s)).   Radiology Studies: Dg Chest 2 View  Result Date: 10/11/2016 CLINICAL DATA:  History of sarcoidosis. Chronic decreased oxygen saturation. Chest pain. EXAM: CHEST  2 VIEW COMPARISON:  June 19, 2016 FINDINGS: There is underlying scarring and bullous disease. There is increased opacity in the left lower lobe and right mid lung regions compared to most recent study. There is an apparent air-fluid level within a bulla in the  posterior segment of the right upper lobe. There is chronic blunting of the costophrenic angles, likely due to scarring. Heart size is normal. There is chronic distortion of pulmonary vascularity, stable. No adenopathy is evident. No bone lesions. IMPRESSION: Areas of scarring and bullous disease, stable. There is now an air-fluid level in a posterior segment right upper lobe bulla, raising concern for infected bulla in this area. There is also airspace consolidation in the left lower lobe with an increase in opacity in this area compared to most recent study. Stable cardiac silhouette.  No adenopathy evident. Electronically Signed   By: Bretta Bang III M.D.   On: 10/11/2016 13:22   Ct Angio Chest Pe W And/or Wo Contrast  Result Date: 10/11/2016 CLINICAL DATA:  Increased pleuritic chest pain. Hypoxia. Sarcoidosis. EXAM: CT ANGIOGRAPHY CHEST WITH CONTRAST TECHNIQUE: Multidetector CT imaging of the chest was performed using the standard protocol during bolus administration of intravenous contrast. Multiplanar CT image reconstructions and MIPs were obtained to evaluate the vascular anatomy. CONTRAST:  100 cc Isovue 370 COMPARISON:  Chest CT 05/05/2016 FINDINGS: Cardiovascular: Suboptimal timing for pulmonary arterial contrast. Contrast is much more dense in the aorta and pulmonary veins. The however, the amount of pulmonary arterial contrast is adequate if  not ideal. A second contrast 0 since second radiation dose are not thought to be worthwhile. No acute pulmonary embolus. Chronic truncation of the posterior right upper lobe pulmonary arterial branch, likely due to vasoconstriction from hypoaeration related to the large cavitary process, no change from 05/05/2016. No acute aortic findings. Left ventricular hypertrophy, septal thickness 1.9 cm. Mild atherosclerotic calcification of the thoracic aorta. Mediastinum/Nodes: Scattered paratracheal, hilar, and subcarinal adenopathy with scattered prevascular and pericardial lymph nodes overall similar to prior. Many of these lymph nodes are calcified, compatible with sarcoidosis. Subcarinal node 1.9 cm, previously 1.8 cm. Lungs/Pleura: Severe architectural distortion and scattered reticulonodular interstitial opacities. Severe airway thickening in the right upper lobe common nearly occluded, with a large cavitary process posteriorly in the right upper lobe containing an air-fluid level. The cavitary process is smaller but the air- fluid level is new. Extensive reticulonodular opacities in both lower lobes. Confluent airspace opacities in the left perihilar region with severe proximal bronchial narrowing in the upper lobe. Peribronchovascular nodularity and airway narrowing in the superior segment right lower lobe and in the right middle lobe as well. This airway narrowing is generally similar to prior exam from April. The left lower lobe airspace opacity is increased significantly in the periphery of the lung, and superimposed pneumonia is a distinct possibility in the left lower lobe. Severe emphysema. Upper Abdomen: Stable upper abdominal lymph nodes. Musculoskeletal: Unremarkable Review of the MIP images confirms the above findings. IMPRESSION: 1. No pulmonary embolus identified. Reduced sensitivity for small emboli due to non ideal bolus timing. 2. Severe architectural distortion in both lungs with extensive findings  of sarcoidosis superimposed on emphysema. There is also extensive increase in airspace opacity in the left lower lobe suspicious for superimposed acute pneumonia. 3. The large cavitary lesion posteriorly in the right upper lobe is reduced in size but has a new air-fluid level -this was previously completely air-filled. 4. Scattered adenopathy characteristic of sarcoidosis. 5. Left ventricular hypertrophy. 6. Prominent bilateral airway narrowing, similar to the April 2018 exam. 7. Aortic Atherosclerosis (ICD10-I70.0) and Emphysema (ICD10-J43.9). Electronically Signed   By: Gaylyn Rong M.D.   On: 10/11/2016 17:58    Scheduled Meds: . chlorthalidone  25 mg Oral Daily  . diltiazem  120 mg Oral Daily  . enoxaparin (LOVENOX) injection  40 mg Subcutaneous Q24H  . ipratropium-albuterol  3 mL Nebulization Q4H  . mometasone-formoterol  2 puff Inhalation BID  . pantoprazole  40 mg Oral Daily   Continuous Infusions: . sodium chloride 75 mL/hr at 10/11/16 2334  . piperacillin-tazobactam (ZOSYN)  IV Stopped (10/12/16 1404)  . vancomycin Stopped (10/12/16 0955)     LOS: 1 day   CHIU, Scheryl Marten, MD [facility] Pager [phone]  If 7PM-7AM, please contact night-coverage [email or web address] Password [email or web address] 10/12/2016, 1:17 PM

## 2016-10-13 DIAGNOSIS — G4733 Obstructive sleep apnea (adult) (pediatric): Secondary | ICD-10-CM

## 2016-10-13 DIAGNOSIS — J9621 Acute and chronic respiratory failure with hypoxia: Secondary | ICD-10-CM

## 2016-10-13 DIAGNOSIS — D869 Sarcoidosis, unspecified: Secondary | ICD-10-CM

## 2016-10-13 DIAGNOSIS — I10 Essential (primary) hypertension: Secondary | ICD-10-CM

## 2016-10-13 DIAGNOSIS — J181 Lobar pneumonia, unspecified organism: Secondary | ICD-10-CM

## 2016-10-13 DIAGNOSIS — Z9989 Dependence on other enabling machines and devices: Secondary | ICD-10-CM

## 2016-10-13 DIAGNOSIS — J189 Pneumonia, unspecified organism: Secondary | ICD-10-CM

## 2016-10-13 LAB — BASIC METABOLIC PANEL
Anion gap: 6 (ref 5–15)
BUN: 10 mg/dL (ref 6–20)
CALCIUM: 8.7 mg/dL — AB (ref 8.9–10.3)
CO2: 32 mmol/L (ref 22–32)
CREATININE: 1.23 mg/dL (ref 0.61–1.24)
Chloride: 101 mmol/L (ref 101–111)
GFR calc Af Amer: >60 mL/min (ref >60)
GFR calc non Af Amer: >60 mL/min (ref >60)
GLUCOSE: 102 mg/dL — AB (ref 65–99)
POTASSIUM: 3.8 mmol/L (ref 3.5–5.1)
Sodium: 139 mmol/L (ref 135–145)

## 2016-10-13 LAB — CBC
HCT: 32.2 % — ABNORMAL LOW (ref 39.0–52.0)
HEMOGLOBIN: 10.1 g/dL — AB (ref 13.0–17.0)
MCH: 25.4 pg — AB (ref 26.0–34.0)
MCHC: 31.4 g/dL (ref 30.0–36.0)
MCV: 80.9 fL (ref 78.0–100.0)
Platelets: 274 10*3/uL (ref 150–400)
RBC: 3.98 MIL/uL — ABNORMAL LOW (ref 4.22–5.81)
RDW: 12.5 % (ref 11.5–15.5)
WBC: 4.3 10*3/uL (ref 4.0–10.5)

## 2016-10-13 LAB — PROCALCITONIN

## 2016-10-13 MED ORDER — GUAIFENESIN ER 600 MG PO TB12: 600.0000 mg | ORAL_TABLET | Freq: Two times a day (BID) | ORAL | 2 refills | Status: DC

## 2016-10-13 MED ORDER — ONDANSETRON HCL 4 MG PO TABS: 4.0000 mg | ORAL_TABLET | Freq: Three times a day (TID) | ORAL | 1 refills | Status: AC | PRN

## 2016-10-13 MED ORDER — IPRATROPIUM-ALBUTEROL 0.5-2.5 (3) MG/3ML IN SOLN: 3.0000 mL | Freq: Three times a day (TID) | RESPIRATORY_TRACT | Status: DC

## 2016-10-13 MED ORDER — LEVOFLOXACIN 750 MG PO TABS: 750.0000 mg | ORAL_TABLET | Freq: Every day | ORAL | 0 refills | Status: AC

## 2016-10-13 NOTE — Discharge Summary (Signed)
Physician Discharge Summary  [name] ZOX:[id number] DOB: 01-Dec-1965 DOA: 10/11/2016  PCP: [facility], [facility]  Admit date: 10/11/2016 Discharge date: 10/13/2016  Time spent: 35 minutes  Recommendations for Outpatient Follow-up:  Repeat BMET to follow electrolytes and renal function.  Repeat CXR in 6 weeks to ensure stability and clearance of infiltrates and new bronchitic changes. Assist patient with recreational drug cessation. Given ongoing chronic terminal resp failure and overall hx of non-compliance (will benefit of palliative care consultation and maybe hospice initiation). Repeat CBC to follow up Hgb trend   Discharge Diagnoses:  Principal Problem:   Acute on chronic respiratory failure with hypoxia (HCC) Active Problems:   OSA on CPAP   Normocytic anemia   Sarcoidosis   Hypertension   Pneumonia of left lower lobe due to infectious organism [facility])   Discharge Condition: stable and improved. Patient discharge and instructed to follow up with PCP in 10 days  Diet recommendation: heart healthy diet   Filed Weights   10/11/16 1144  Weight: 78 kg (172 lb)    History of present illness:  As per H&P written by Dr. [name] on 10/11/16 51 y.o. male with medical history significant of chronic respiratory failure on 4-5L home O2 due to sarcoidosis and COPD; OSA on CPAP; HTN; and chronic diastolic CHF presenting with SOB.  "It started with the more I tried to breathe in or out it was starting to hurt more, wanted to make sure it wasn't no kind of pneumonia trying to sneak in on me".  SOB and pain with breathing over the last couple of days. He thought he was straining because of coughing - he has chronic cough and is unsure if that has worsened.  He is coughing up greensih sputum which is different.  No fevers.    Hospital Course:  Acute on chronic respiratory failure resulting from LLL pneumonia as well as sarcoidosis, COPD -Patient with marked chronic respiratory failure  requiring 4-5L home O2 -CT showsinfiltrate in leftlower lobe concerning for superimposedpneumonia. -Influenza neg -patient empirically treated with Vanc/Zosyn for now. -once stable and after follow neg culture results, antibiotics transitioned to levaquin and patient discharge home. Also discharge on mucinex to assist with expectoration. -Sputum cultures demonstrated normal respiratory flora -Blood cultures w/o any growth  -Strep pneumo neg -Legionella serologies neg -breathing improved and back to baseline. Continue using 4-5L oxygen supplementation, no fever, normal wBC's  OSA -compliance with CPAP use encourage -continue CPAP QHS  Anemia -remains stable and at baseline -no signs of acute bleeding  -will recommend repeat CBC at follow up to assess hgb trend   HTN -stable and well controled overall -will Continue Chlorthalidone and Diltizaem  Cocaine abuse -UDS pos for cocaine -cessation counseling and education provided   GERD -will continue PPI  Procedures:  See below for x-ray reports   Consultations:  Palliative care   Discharge Exam: Vitals:   10/13/16 0539 10/13/16 0720  BP: 112/87   Pulse: 94   Resp: 18   Temp: 97.7 F (36.5 C)   SpO2: 100% 100%   General exam: Awake, sitting on the chair and having breakfast. Denies CP, nausea, vomting or any other acute complaints. Respiratory system: Normal respiratory effort, mild end-expiratory wheezing appreciated, normal resp effort and no using accessory muscles. Cardiovascular system: regular rate, s1, s2 Gastrointestinal system: Soft, nondistended, positive BS Central nervous system: CN2-12 grossly intact, strength intact Extremities: Perfused, no clubbing Psychiatry: Mood normal // no visual hallucinations    Discharge Instructions  Discharge Instructions    Diet - low sodium heart healthy    Complete by:  As directed    Discharge instructions    Complete by:  As directed    Take medications  as prescribed  Use you chest vest and flutter valve as discussed Arrange follow up with PCP in 10 days Stop the use of recreational drugs Be compliant with the rest of your medications and CPAP as instructed     Current Discharge Medication List    START taking these medications   Details  levofloxacin (LEVAQUIN) 750 MG tablet Take 1 tablet (750 mg total) by mouth daily. Qty: 7 tablet, Refills: 0      CONTINUE these medications which have CHANGED   Details  guaiFENesin (MUCINEX) 600 MG 12 hr tablet Take 1 tablet (600 mg total) by mouth 2 (two) times daily. Qty: 60 tablet, Refills: 2    ondansetron (ZOFRAN) 4 MG tablet Take 1 tablet (4 mg total) by mouth every 8 (eight) hours as needed for nausea or vomiting. Qty: 30 tablet, Refills: 1      CONTINUE these medications which have NOT CHANGED   Details  albuterol (PROVENTIL HFA;VENTOLIN HFA) 108 (90 Base) MCG/ACT inhaler Inhale 2 puffs into the lungs every 6 (six) hours as needed for wheezing or shortness of breath. Qty: 1 Inhaler, Refills: 3    albuterol (PROVENTIL) (2.5 MG/3ML) 0.083% nebulizer solution USE 1 VIAL IN NEBULIZER EVERY 6 HOURS AS NEEDED FOR WHEEZING    calcium-vitamin D (OSCAL WITH D) 500-200 MG-UNIT tablet Take 1 tablet by mouth daily.    CARTIA XT 120 MG 24 hr capsule Take 120 mg by mouth every morning.    chlorthalidone (HYGROTON) 25 MG tablet Take 25 mg by mouth daily.    cyclobenzaprine (FLEXERIL) 10 MG tablet Take 20 mg by mouth 3 (three) times daily as needed for muscle spasms.     diphenhydrAMINE (BENADRYL) 25 MG tablet Take 25 mg by mouth every 6 (six) hours as needed for itching or allergies.    Ergocalciferol (VITAMIN D2) 400 units TABS Take 400 Units by mouth daily.    ferrous sulfate 325 (65 FE) MG tablet Take 325 mg by mouth 2 (two) times daily with a meal.    ipratropium-albuterol (DUONEB) 0.5-2.5 (3) MG/3ML SOLN Take 3 mLs by nebulization every 4 (four) hours as needed. Qty: 360 mL, Refills: 3     Multiple Vitamins-Minerals (MULTIVITAMIN WITH MINERALS) tablet Take 1 tablet by mouth every morning.     omeprazole (PRILOSEC) 40 MG capsule Take 40 mg by mouth daily.     OXYGEN Inhale 4 L into the lungs continuous.    saline (AYR) GEL Place 1 application into the nose every 4 (four) hours as needed (dryness/irritation).    SYMBICORT 160-4.5 MCG/ACT inhaler Inhale 1 puff into the lungs 2 (two) times daily. Qty: 1 Inhaler, Refills: 3       Allergies  Allergen Reactions  . Clindamycin/Lincomycin Anaphylaxis and Swelling    Made face and tongue swell   Follow-up Information    [facility]. Schedule an appointment as soon as possible for a visit in 10 day(s).   Contact information: [facility] [address] [place] [address] [phone]            The results of significant diagnostics from this hospitalization (including imaging, microbiology, ancillary and laboratory) are listed below for reference.    Significant Diagnostic Studies: Dg Chest 2 View  Result Date: 10/11/2016 CLINICAL  DATA:  History of sarcoidosis. Chronic decreased oxygen saturation. Chest pain. EXAM: CHEST  2 VIEW COMPARISON:  June 19, 2016 FINDINGS: There is underlying scarring and bullous disease. There is increased opacity in the left lower lobe and right mid lung regions compared to most recent study. There is an apparent air-fluid level within a bulla in the posterior segment of the right upper lobe. There is chronic blunting of the costophrenic angles, likely due to scarring. Heart size is normal. There is chronic distortion of pulmonary vascularity, stable. No adenopathy is evident. No bone lesions. IMPRESSION: Areas of scarring and bullous disease, stable. There is now an air-fluid level in a posterior segment right upper lobe bulla, raising concern for infected bulla in this area. There is also airspace consolidation in the left lower lobe with an increase in opacity in this area  compared to most recent study. Stable cardiac silhouette.  No adenopathy evident. Electronically Signed   By: Bretta Bang III M.D.   On: 10/11/2016 13:22   Ct Angio Chest Pe W And/or Wo Contrast  Result Date: 10/11/2016 CLINICAL DATA:  Increased pleuritic chest pain. Hypoxia. Sarcoidosis. EXAM: CT ANGIOGRAPHY CHEST WITH CONTRAST TECHNIQUE: Multidetector CT imaging of the chest was performed using the standard protocol during bolus administration of intravenous contrast. Multiplanar CT image reconstructions and MIPs were obtained to evaluate the vascular anatomy. CONTRAST:  100 cc Isovue 370 COMPARISON:  Chest CT 05/05/2016 FINDINGS: Cardiovascular: Suboptimal timing for pulmonary arterial contrast. Contrast is much more dense in the aorta and pulmonary veins. The however, the amount of pulmonary arterial contrast is adequate if not ideal. A second contrast 0 since second radiation dose are not thought to be worthwhile. No acute pulmonary embolus. Chronic truncation of the posterior right upper lobe pulmonary arterial branch, likely due to vasoconstriction from hypoaeration related to the large cavitary process, no change from 05/05/2016. No acute aortic findings. Left ventricular hypertrophy, septal thickness 1.9 cm. Mild atherosclerotic calcification of the thoracic aorta. Mediastinum/Nodes: Scattered paratracheal, hilar, and subcarinal adenopathy with scattered prevascular and pericardial lymph nodes overall similar to prior. Many of these lymph nodes are calcified, compatible with sarcoidosis. Subcarinal node 1.9 cm, previously 1.8 cm. Lungs/Pleura: Severe architectural distortion and scattered reticulonodular interstitial opacities. Severe airway thickening in the right upper lobe common nearly occluded, with a large cavitary process posteriorly in the right upper lobe containing an air-fluid level. The cavitary process is smaller but the air- fluid level is new. Extensive reticulonodular opacities in  both lower lobes. Confluent airspace opacities in the left perihilar region with severe proximal bronchial narrowing in the upper lobe. Peribronchovascular nodularity and airway narrowing in the superior segment right lower lobe and in the right middle lobe as well. This airway narrowing is generally similar to prior exam from April. The left lower lobe airspace opacity is increased significantly in the periphery of the lung, and superimposed pneumonia is a distinct possibility in the left lower lobe. Severe emphysema. Upper Abdomen: Stable upper abdominal lymph nodes. Musculoskeletal: Unremarkable Review of the MIP images confirms the above findings. IMPRESSION: 1. No pulmonary embolus identified. Reduced sensitivity for small emboli due to non ideal bolus timing. 2. Severe architectural distortion in both lungs with extensive findings of sarcoidosis superimposed on emphysema. There is also extensive increase in airspace opacity in the left lower lobe suspicious for superimposed acute pneumonia. 3. The large cavitary lesion posteriorly in the right upper lobe is reduced in size but has a new air-fluid level -this was previously completely  air-filled. 4. Scattered adenopathy characteristic of sarcoidosis. 5. Left ventricular hypertrophy. 6. Prominent bilateral airway narrowing, similar to the April 2018 exam. 7. Aortic Atherosclerosis (ICD10-I70.0) and Emphysema (ICD10-J43.9). Electronically Signed   By: Gaylyn Rong M.D.   On: 10/11/2016 17:58    Microbiology: Recent Results (from the past 240 hour(s))  Blood culture (routine x 2)     Status: None   Collection Time: 10/11/16  3:53 PM  Result Value Ref Range Status   Specimen Description BLOOD BLOOD LEFT FOREARM  Final   Special Requests   Final    BOTTLES DRAWN AEROBIC AND ANAEROBIC Blood Culture adequate volume   Culture   Final    NO GROWTH 5 DAYS Performed at [facility], [address] [place], [place] [address]    Report Status  10/16/2016 FINAL  Final  Blood culture (routine x 2)     Status: None   Collection Time: 10/11/16  8:34 PM  Result Value Ref Range Status   Specimen Description BLOOD RIGHT ANTECUBITAL  Final   Special Requests   Final    BOTTLES DRAWN AEROBIC AND ANAEROBIC Blood Culture adequate volume   Culture   Final    NO GROWTH 5 DAYS Performed at [facility], [address], [place], [place], [place] [address]    Report Status 10/16/2016 FINAL  Final  MRSA PCR Screening     Status: None   Collection Time: 10/12/16 12:34 PM  Result Value Ref Range Status   MRSA by PCR NEGATIVE NEGATIVE Final    Comment:        The GeneXpert MRSA Assay (FDA approved for NASAL specimens only), is one component of a comprehensive MRSA colonization surveillance program. It is not intended to diagnose MRSA infection nor to guide or monitor treatment for MRSA infections.   Culture, sputum-assessment     Status: None   Collection Time: 10/12/16  9:41 PM  Result Value Ref Range Status   Specimen Description SPUTUM  Final   Special Requests Normal  Final   Sputum evaluation THIS SPECIMEN IS ACCEPTABLE FOR SPUTUM CULTURE  Final   Report Status 10/12/2016 FINAL  Final  Legionella Pneumophila/Culture     Status: None   Collection Time: 10/12/16  9:41 PM  Result Value Ref Range Status   Legionella Pneumophila DFA Negative Negative Final    Comment: (NOTE) Performed At: [facility] [address] [place], [place] [id number] Mila Homer MD NL:[id number]    Legionella Species Culture PENDING  Incomplete   Source, Legionella Cul SPUTUM  Corrected  Culture, respiratory (NON-Expectorated)     Status: None   Collection Time: 10/12/16  9:41 PM  Result Value Ref Range Status   Specimen Description SPUTUM  Final   Special Requests Normal Reflexed from [id number]  Final   Gram Stain   Final    ABUNDANT WBC PRESENT,BOTH PMN AND MONONUCLEAR FEW SQUAMOUS EPITHELIAL CELLS PRESENT FEW GRAM POSITIVE COCCI FEW  GRAM POSITIVE RODS RARE GRAM NEGATIVE RODS    Culture   Final    MODERATE Consistent with normal respiratory flora. Performed at [facility], [address] [place], [place] [address]    Report Status 10/15/2016 FINAL  Final     Labs: Basic Metabolic Panel:  Recent Labs Lab 10/11/16 1541 10/12/16 0158 10/13/16 0459  NA 140 139 139  K 3.7 4.0 3.8  CL 98* 103 101  CO2 32 29 32  GLUCOSE 98 112* 102*  BUN 12  11 10  CREATININE 0.94 1.00 1.23  CALCIUM 9.1 8.6* 8.7*   Liver Function Tests:  Recent Labs Lab 10/11/16 1541  AST 31  ALT 16*  ALKPHOS 95  BILITOT 0.5  PROT 7.3  ALBUMIN 3.6   CBC:  Recent Labs Lab 10/11/16 1541 10/12/16 0158 10/13/16 0459  WBC 5.8 5.5 4.3  NEUTROABS 3.5 3.4  --   HGB 11.8* 10.9* 10.1*  HCT 37.4* 34.2* 32.2*  MCV 81.1 79.9 80.9  PLT 301 245 274   BNP: BNP (last 3 results)  Recent Labs  04/16/16 0537 05/01/16 0048 06/20/16 0154  BNP 69.8 15.0 32.2    Signed:  Vassie Loll MD.  [facility] 10/13/2016, 2:06 PM

## 2016-10-13 NOTE — Progress Notes (Signed)
Patient ID: [name], male   DOB: 1965-09-16, 51 y.o.   MRN: [id number]   Attempted to meet with patient several times over the past two days without success.  Patient would tell me he was either eating or resting, today he  is for discharge home  He tells me "I have your card".  I encouraged him to call with questions or concerns  Lorinda Creed NP  Palliative Medicine Team Team Phone # [phone] Pager [phone]  No charge

## 2016-10-13 NOTE — Care Management Note (Signed)
Case Management Note  Patient Details  Name: Jamesyn Lindell MRN: [id number] Date of Birth: 1965-10-19  Subjective/Objective:  Has Home 02 travel tank. No CM needs.                  Action/Plan:d/c home.   Expected Discharge Date:  10/13/16               Expected Discharge Plan:  Home/Self Care  In-House Referral:     Discharge planning Services  CM Consult  Post Acute Care Choice:  Durable Medical Equipment (Has home 02-has travel tank) Choice offered to:     DME Arranged:    DME Agency:     HH Arranged:    HH Agency:     Status of Service:  Completed, signed off  If discussed at [employer], dates discussed:    Additional Comments:  Lanier Clam, RN 10/13/2016, 3:54 PM

## 2016-10-15 LAB — CULTURE, RESPIRATORY

## 2016-10-15 LAB — CULTURE, RESPIRATORY W GRAM STAIN
Culture: NORMAL
Special Requests: NORMAL

## 2016-10-16 LAB — CULTURE, BLOOD (ROUTINE X 2)
CULTURE: NO GROWTH
Culture: NO GROWTH
Special Requests: ADEQUATE
Special Requests: ADEQUATE

## 2016-10-21 LAB — LEGIONELLA CULTURE REFLEX

## 2016-10-21 LAB — LEGIONELLA PNEUMOPHILA/CULTURE: Legionella Pneumophila DFA: NEGATIVE

## 2016-12-10 ENCOUNTER — Telehealth: Payer: Self-pay | Admitting: Emergency Medicine

## 2016-12-10 NOTE — Telephone Encounter (Signed)
In RB to sign folder.

## 2016-12-14 NOTE — Telephone Encounter (Signed)
Spoke with RB, he will do the handicap placard today

## 2016-12-14 NOTE — Telephone Encounter (Signed)
Handicap placard has been signed.  There are also disability papers that need to be completed.  I do not believe I will be able to do these until I am able to speak with him.  I will either have to call him or set up an OV

## 2016-12-15 NOTE — Telephone Encounter (Signed)
I placed signed handicap form up front for pick up  Pt aware  Appt scheduled to review and complete forms

## 2016-12-15 NOTE — Telephone Encounter (Signed)
Forms are in RB's sign folder.

## 2016-12-16 ENCOUNTER — Ambulatory Visit (INDEPENDENT_AMBULATORY_CARE_PROVIDER_SITE_OTHER): Payer: 59 | Admitting: Emergency Medicine

## 2016-12-16 ENCOUNTER — Encounter: Payer: Self-pay | Admitting: Emergency Medicine

## 2016-12-16 DIAGNOSIS — D869 Sarcoidosis, unspecified: Secondary | ICD-10-CM

## 2016-12-16 DIAGNOSIS — Z23 Encounter for immunization: Secondary | ICD-10-CM | POA: Diagnosis not present

## 2016-12-16 NOTE — Progress Notes (Signed)
Subjective:    Patient ID: [name], male    DOB: 10/17/1965, 51 y.o.   MRN: [id number]  HPI 51 yo male never smoker with stage IV sarcoidosis and bronchiectasis and chronic respiratory failure with chronic hypoxemia needing oxygen, obstructive sleep apnea needing CPAP. Also cocaine abuse which has contributed to decompensations in his respiratory status. As hospitalized with an acute exacerbation of his bronchiectasis and COPD in the setting of rhinovirus positive (flu negative). His cocaine was positive. He was treated with steroids and abx. He is still finishing the prednisone taper. He has been off Symbicort for over a month, has not been able to afford it, likely corresponds with his decompensation. He currently feels a bit better. His exercise tolerance is improved. He still having difficulty with secretion management but he is not on Symbicort even after the hospitalization.  He has a chest Vest, is maintained on mucinex. Uses albuterol very frequently.             He is using CPAP at night. He wants to get a CPAP equipment cleaner.                         OV to discuss Disability paperwork 12/16/16 --  Hx sarcoidosis, hypoxemia, bronchiectasis, COPD. Has been stable since last visit, good days and bad days. He was in the hospital last in September. We reviewed his Student loan Disability Forms, all questions answered and forms completed.                 Review of Systems As per HPI     Objective:   Physical Exam  Vitals:   12/16/16 1532 12/16/16 1533  BP:  120/80  Pulse:  (!) 110  SpO2:  100%  Weight: 153 lb (69.4 kg)   Height: 5' 7.5" (1.715 m)     GEN: A/Ox3; pleasant , NAD   HEENT:  OP clear, no erythema  NECK:  No stridor  RESP  Decreased BS in bases , no accessory muscle use  CARD:  RRR, no m/r/g  , no peripheral edema, pulses intact, no cyanosis or clubbing.  Musco: Warm bil, no deformities or joint swelling noted.   Neuro: alert, no focal deficits noted.     Skin: Warm, no lesions or rashes  04/24/15 --  COMPARISON: January 13, 2015 chest radiograph and chest CT September 30, 2014  FINDINGS: There is widespread fibrotic change throughout the lungs bilaterally with airspace consolidation in both mid lungs and left lower lobe region, stable. There is scarring in the upper lobes as well as in the right base with chronic lateral basilar pleural thickening. There is a large bulla in the right upper lobe, stable.  No new opacity is evident. The heart size and pulmonary vascularity are normal. No adenopathy is apparent. No bone lesions are evident.  IMPRESSION: Extensive cicatrization as well as areas of patchy airspace consolidation, more severe on the left than on the right, and scarring throughout the lungs bilaterally. No new opacity is evident. No adenopathy. The changes in the lungs are consistent with chronic sarcoidosis.  It should be noted that subtle acute pneumonia could easily be obscured with this degree of underlying parenchymal scarring and fibrotic type change     Assessment & Plan:  Sarcoidosis Disability paperwork for your student loans filled out today.  We will put a copy in your chart and send you with the original to file. Please continue your medications,  your oxygen, your CPAP as you have been using them Follow with Dr Delton CoombesByrum in 4 - 6 months or sooner if you have any problems.  Levy Pupaobert Julus Kelley, MD, PhD 12/16/2016, 3:53 PM [facility] [phone] or if no answer [phone]

## 2016-12-16 NOTE — Assessment & Plan Note (Signed)
Disability paperwork for your student loans filled out today.  We will put a copy in your chart and send you with the original to file. Please continue your medications, your oxygen, your CPAP as you have been using them Follow with Dr Delton CoombesByrum in 4 - 6 months or sooner if you have any problems.

## 2016-12-16 NOTE — Patient Instructions (Signed)
Disability paperwork for your student loans filled out today.  We will put a copy in your chart and send you with the original to file. Please continue your medications, your oxygen, your CPAP as you have been using them Follow with Dr Jeffifer Rabold in 4 - 6 months or sooner if you have any problems. 

## 2017-02-03 ENCOUNTER — Encounter: Payer: Self-pay | Admitting: Emergency Medicine

## 2017-02-03 ENCOUNTER — Other Ambulatory Visit (INDEPENDENT_AMBULATORY_CARE_PROVIDER_SITE_OTHER): Payer: Self-pay

## 2017-02-03 ENCOUNTER — Ambulatory Visit (INDEPENDENT_AMBULATORY_CARE_PROVIDER_SITE_OTHER)
Admission: RE | Admit: 2017-02-03 | Discharge: 2017-02-03 | Disposition: A | Discharge status: Home / Self Care | Payer: 59 | Source: Ambulatory Visit | Attending: Emergency Medicine | Admitting: Emergency Medicine

## 2017-02-03 ENCOUNTER — Ambulatory Visit (INDEPENDENT_AMBULATORY_CARE_PROVIDER_SITE_OTHER): Payer: 59 | Admitting: Emergency Medicine

## 2017-02-03 VITALS — BP 138/82 | HR 120 | Ht 67.5 in | Wt 147.8 lb

## 2017-02-03 DIAGNOSIS — R079 Chest pain, unspecified: Secondary | ICD-10-CM

## 2017-02-03 DIAGNOSIS — J479 Bronchiectasis, uncomplicated: Secondary | ICD-10-CM

## 2017-02-03 DIAGNOSIS — J471 Bronchiectasis with (acute) exacerbation: Secondary | ICD-10-CM | POA: Diagnosis not present

## 2017-02-03 LAB — CBC WITH DIFFERENTIAL/PLATELET
BASOS PCT: 0.7 % (ref 0.0–3.0)
Basophils Absolute: 0 10*3/uL (ref 0.0–0.1)
EOS PCT: 6.3 % — AB (ref 0.0–5.0)
Eosinophils Absolute: 0.4 10*3/uL (ref 0.0–0.7)
HCT: 40.7 % (ref 39.0–52.0)
HEMOGLOBIN: 13 g/dL (ref 13.0–17.0)
Lymphocytes Relative: 9.1 % — ABNORMAL LOW (ref 12.0–46.0)
Lymphs Abs: 0.6 10*3/uL — ABNORMAL LOW (ref 0.7–4.0)
MCHC: 31.8 g/dL (ref 30.0–36.0)
MCV: 80.2 fl (ref 78.0–100.0)
MONOS PCT: 9.9 % (ref 3.0–12.0)
Monocytes Absolute: 0.7 10*3/uL (ref 0.1–1.0)
Neutro Abs: 4.9 10*3/uL (ref 1.4–7.7)
Neutrophils Relative %: 74 % (ref 43.0–77.0)
Platelets: 372 10*3/uL (ref 150.0–400.0)
RBC: 5.08 Mil/uL (ref 4.22–5.81)
RDW: 14.2 % (ref 11.5–15.5)
WBC: 6.7 10*3/uL (ref 4.0–10.5)

## 2017-02-03 LAB — BASIC METABOLIC PANEL
BUN: 14 mg/dL (ref 6–23)
CHLORIDE: 98 meq/L (ref 96–112)
CO2: 34 meq/L — AB (ref 19–32)
Calcium: 9.9 mg/dL (ref 8.4–10.5)
Creatinine, Ser: 0.88 mg/dL (ref 0.40–1.50)
GFR: 116.82 mL/min (ref >60.00)
GLUCOSE: 98 mg/dL (ref 70–99)
POTASSIUM: 3.7 meq/L (ref 3.5–5.1)
SODIUM: 140 meq/L (ref 135–145)

## 2017-02-03 LAB — TROPONIN I: TNIDX: 0.01 ug/l (ref 0.00–0.06)

## 2017-02-03 IMAGING — CR DG CHEST 2V
2 series · 2 of 2 positions shown · non-contrast
Comparison: October 02, 2014 and July 21, 2015

CLINICAL DATA: History of sarcoidosis. Cough with shortness of
breath. Chest pain.

EXAM:
CHEST  2 VIEW

[w chest pa]
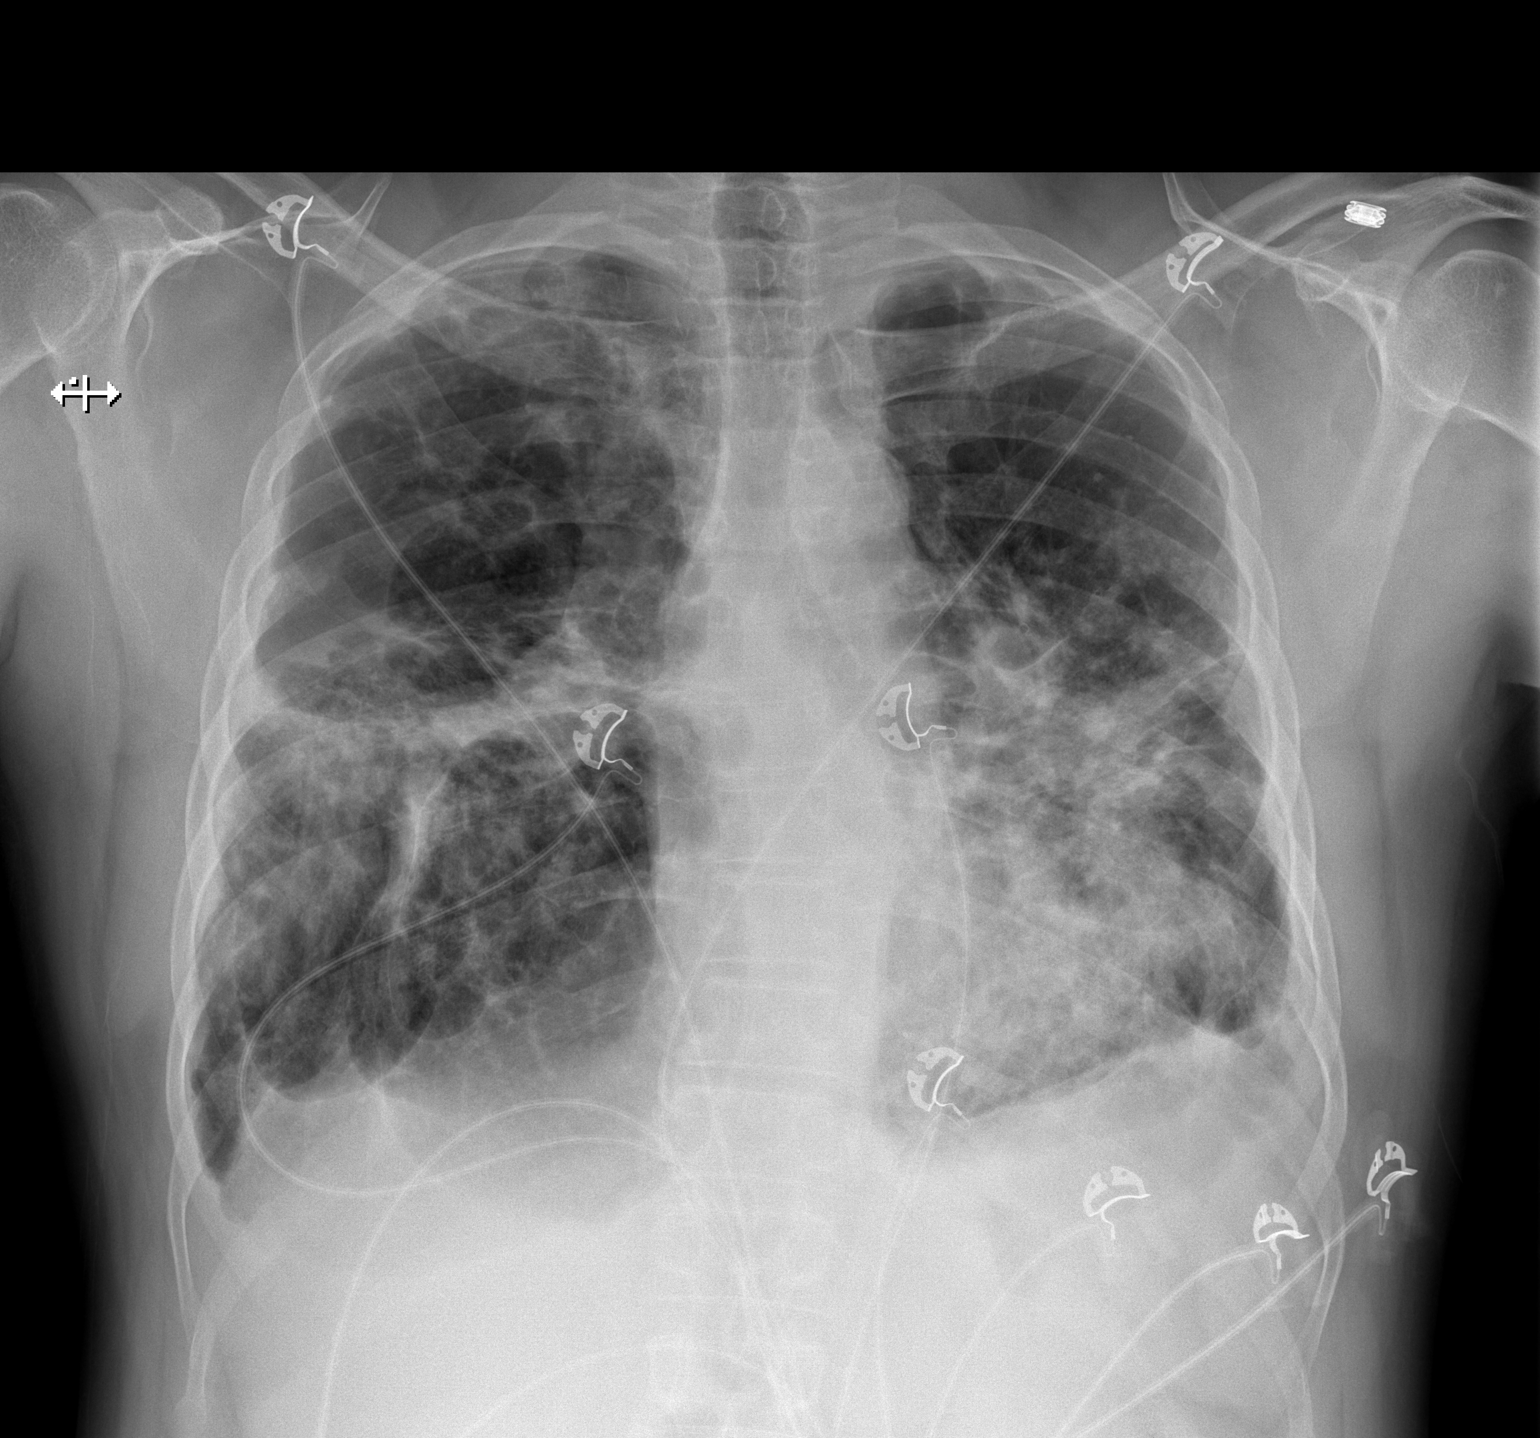

[w chest lat]
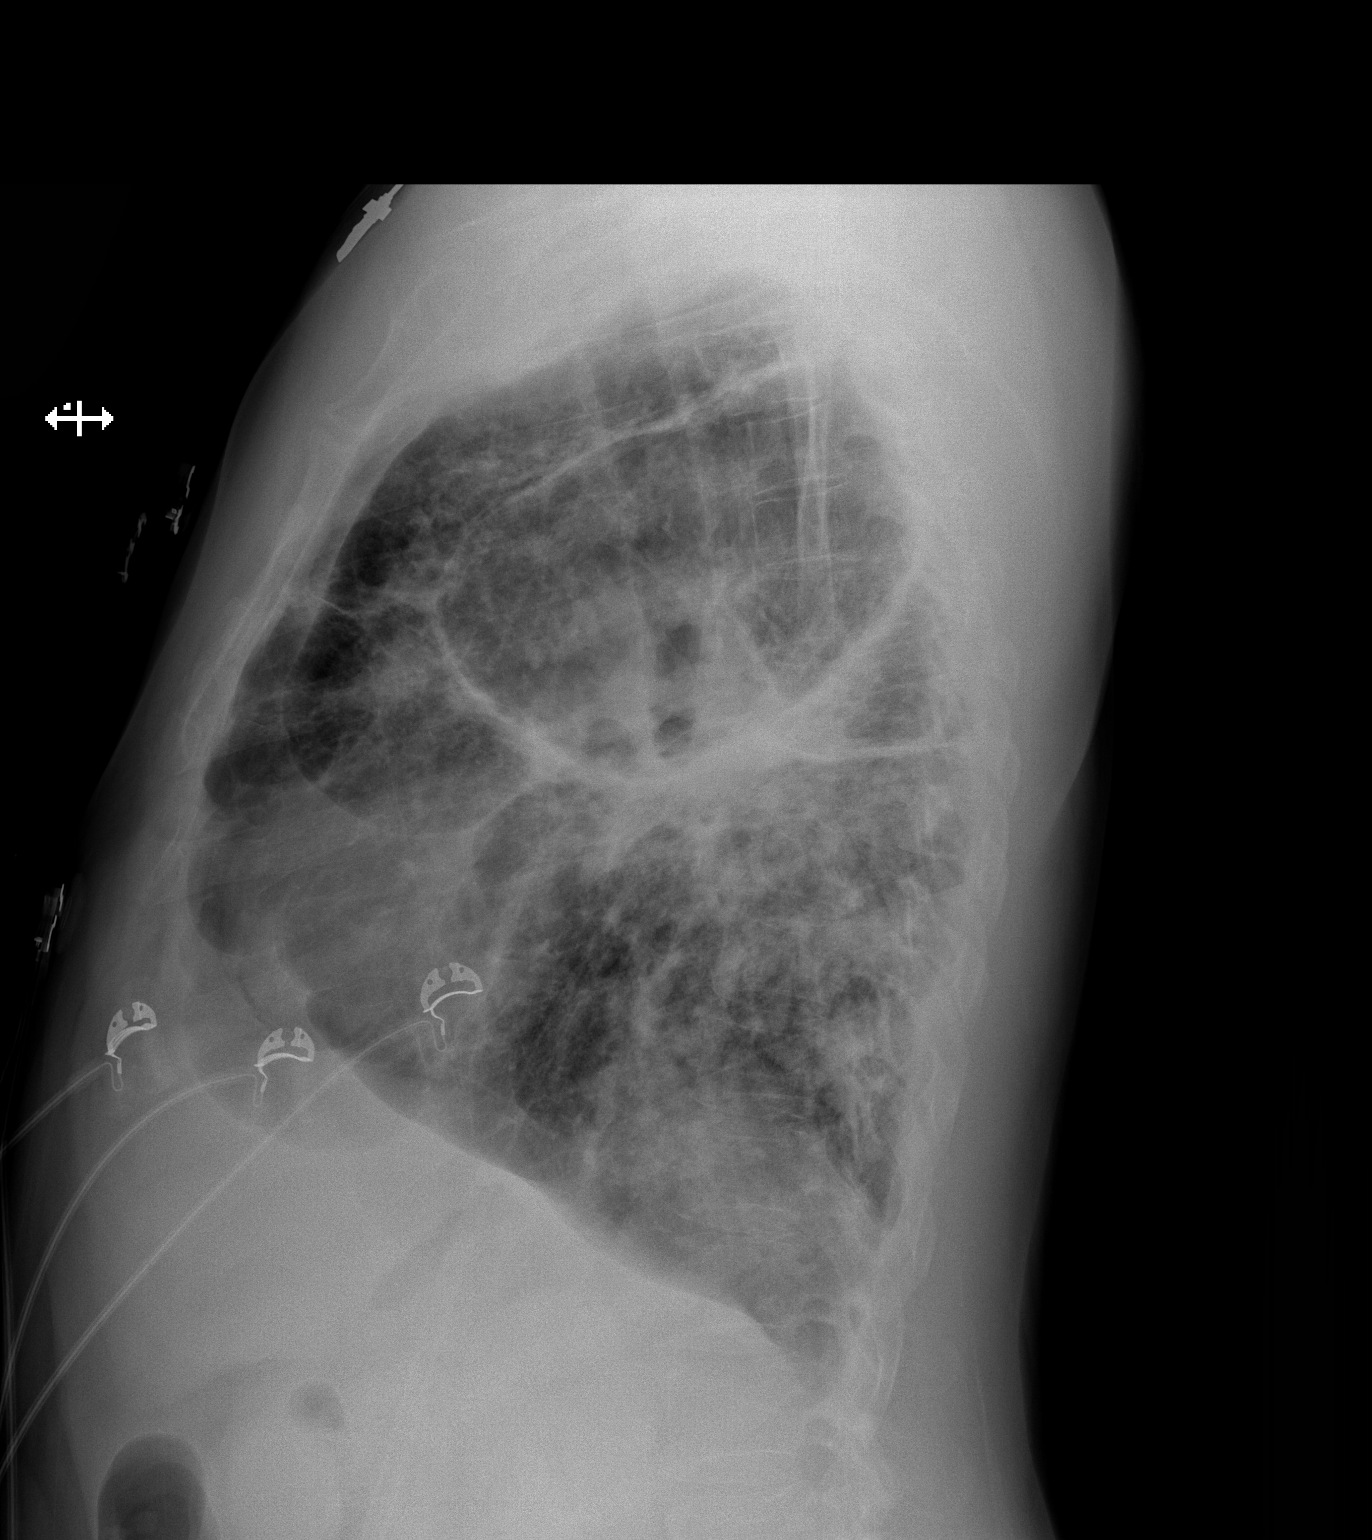

[2 of 2 positions shown; findings below may reference images not displayed]

FINDINGS: Extensive scarring and fibrotic type change remain stable,
consistent chronic changes of sarcoidosis. There is no new opacity
evident on either side. Heart size is normal. Distortion of the
pulmonary vascularity is stable due to the extensive scarring. No
adenopathy is appreciable on this study. No bone lesions.
IMPRESSION: Extensive scarring and fibrosis remain stable. No new opacity. No
adenopathy. Stable cardiac silhouette. Changes are consistent with
known chronic sarcoidosis.

## 2017-02-03 MED ORDER — LEVOFLOXACIN 750 MG PO TABS: 750.0000 mg | ORAL_TABLET | Freq: Every day | ORAL | 0 refills | Status: DC

## 2017-02-03 MED ORDER — PREDNISONE 10 MG PO TABS: ORAL_TABLET | ORAL | 0 refills | Status: DC

## 2017-02-03 NOTE — Assessment & Plan Note (Signed)
Progressive hypoxemia in the setting of acute exacerbation

## 2017-02-03 NOTE — Patient Instructions (Addendum)
Labs today: CBC, BMP, troponin I CXR today Stop symbicort for now Start taking your DuoNeb 4x a day on a schedule until next visit.  Take levaquin 750mg  once a day for 7 days Take prednisone as directed until completely gone.  Follow with Dr Delton CoombesByrum or APP in 1 week

## 2017-02-03 NOTE — Assessment & Plan Note (Signed)
CPap as ordered

## 2017-02-03 NOTE — Progress Notes (Signed)
Subjective:    Patient ID: [name], male    DOB: 05/27/1965, 52 y.o.   MRN: [id number]  HPI          Acute office visit 02/03/17 --  Mr. 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  He has a history of cocaine abuse which often coincides with decompensation of his lung disease and his exacerbations.  He has chronic hypoxemic respiratory failure and obstructive sleep apnea that has been treated with CPAP.  He has used the chest vest in the past for mucociliary clearance.  Current bronchodilators are Symbicort, DuoNeb that he is using about 3x a day.    He states today that he has been experiencing dyspnea and cough for 2 weeks, and pleuritic chest pain for the last 2 days. He denies fever. Has seen brown sputum last 24 hours. Having joint and chest pain, pleuritic. He has lost weight unintentionally, 30 lbs over the last. Last used cocaine on Saturday  ECG today > no ST changes on my eval.    Review of Systems As per HPI  Past Medical History:  Diagnosis Date  . Bronchitis   . CHF (congestive heart failure) (HCC)   . Chronic respiratory failure (HCC)    home O@ 4L (01/28/14)  . Chronic respiratory failure (HCC)   . COPD (chronic obstructive pulmonary disease) (HCC)   . GERD (gastroesophageal reflux disease)   . Hypertension   . Neuromuscular disorder (HCC)   . OSA on CPAP    ramps 5 to 10  . Pneumonia   . Sarcoidosis      Family History  Problem Relation Age of Onset  . Diabetes Father      Social History   Socioeconomic History  . Marital status: Divorced    Spouse name: Not on file  . Number of children: 2  . Years of education: Not on file  . Highest education level: Not on file  Social Needs  . Financial resource strain: Not on file  . Food insecurity - worry: Not on file  . Food insecurity - inability: Not on file  . Transportation needs - medical: Not on file    . Transportation needs - non-medical: Not on file  Occupational History  . Occupation: Disabled.    Employer: RETIRED  Tobacco Use  . Smoking status: Never Smoker  . Smokeless tobacco: Never Used  Substance and Sexual Activity  . Alcohol use: Yes    Comment: social 3-4 times weekly  . Drug use: No  . Sexual activity: No  Other Topics Concern  . Not on file  Social History Narrative   ** Merged History Encounter **       Divorced.  Lives alone.     Allergies  Allergen Reactions  . Clindamycin/Lincomycin Anaphylaxis and Swelling    Made face and tongue swell     Outpatient Medications Prior to Visit  Medication Sig Dispense Refill  . albuterol (PROVENTIL HFA;VENTOLIN HFA) 108 (90 Base) MCG/ACT inhaler Inhale 2 puffs into the lungs every 6 (six) hours as needed for wheezing or shortness of breath. 1 Inhaler 3  . albuterol (PROVENTIL) (2.5 MG/3ML) 0.083% nebulizer solution USE 1 VIAL IN NEBULIZER EVERY 6 HOURS AS NEEDED FOR WHEEZING    . calcium-vitamin D (OSCAL WITH D) 500-200 MG-UNIT tablet Take 1 tablet by mouth daily.    Marland Kitchen CARTIA XT 120 MG 24 hr capsule Take 120  mg by mouth every morning.    . chlorthalidone (HYGROTON) 25 MG tablet Take 25 mg by mouth daily.    . cyclobenzaprine (FLEXERIL) 10 MG tablet Take 20 mg by mouth 3 (three) times daily as needed for muscle spasms.     . diphenhydrAMINE (BENADRYL) 25 MG tablet Take 25 mg by mouth every 6 (six) hours as needed for itching or allergies.    . Ergocalciferol (VITAMIN D2) 400 units TABS Take 400 Units by mouth daily.    . ferrous sulfate 325 (65 FE) MG tablet Take 325 mg by mouth 2 (two) times daily with a meal.    . guaiFENesin (MUCINEX) 600 MG 12 hr tablet Take 1 tablet (600 mg total) by mouth 2 (two) times daily. 60 tablet 2  . ipratropium-albuterol (DUONEB) 0.5-2.5 (3) MG/3ML SOLN Take 3 mLs by nebulization every 4 (four) hours as needed. (Patient taking differently: Take 3 mLs by nebulization every 4 (four) hours as  needed (SOB, wheezing). ) 360 mL 3  . Multiple Vitamins-Minerals (MULTIVITAMIN WITH MINERALS) tablet Take 1 tablet by mouth every morning.     Marland Kitchen omeprazole (PRILOSEC) 40 MG capsule Take 40 mg by mouth daily.     . ondansetron (ZOFRAN) 4 MG tablet Take 1 tablet (4 mg total) by mouth every 8 (eight) hours as needed for nausea or vomiting. 30 tablet 1  . OXYGEN Inhale 4 L into the lungs continuous.    . saline (AYR) GEL Place 1 application into the nose every 4 (four) hours as needed (dryness/irritation).    . SYMBICORT 160-4.5 MCG/ACT inhaler Inhale 1 puff into the lungs 2 (two) times daily. 1 Inhaler 3   No facility-administered medications prior to visit.         Objective:   Physical Exam  Vitals:   02/03/17 1435 02/03/17 1436  BP: 138/82   Pulse: (!) 120   SpO2: (!) 86% 91%  Weight: 147 lb 12.8 oz (67 kg)   Height: 5' 7.5" (1.715 m)     GEN: A/Ox3; mildly diaphoretic, initially with some breathing distress but quickly resolved with rest   HEENT:  OP clear, no erythema   NECK:  No stridor  RESP  Decreased BS in bases , accessory muscle use initially but improved, no active wheezing  CARD:  RRR, no m/r/g  , no peripheral edema, pulses intact, no cyanosis or clubbing.  Musco: Tender to palpation bilateral costal margin and parascapular areas  Neuro: alert, no focal deficits noted.    Skin: Warm, no lesions or rashes    10/11/16 Ct Chest --  COMPARISON:  Chest CT 05/05/2016  FINDINGS: Cardiovascular: Suboptimal timing for pulmonary arterial contrast. Contrast is much more dense in the aorta and pulmonary veins. The however, the amount of pulmonary arterial contrast is adequate if not ideal. A second contrast 0 since second radiation dose are not thought to be worthwhile.  No acute pulmonary embolus. Chronic truncation of the posterior right upper lobe pulmonary arterial branch, likely due to vasoconstriction from hypoaeration related to the large  cavitary process, no change from 05/05/2016.  No acute aortic findings. Left ventricular hypertrophy, septal thickness 1.9 cm.  Mild atherosclerotic calcification of the thoracic aorta.  Mediastinum/Nodes: Scattered paratracheal, hilar, and subcarinal adenopathy with scattered prevascular and pericardial lymph nodes overall similar to prior. Many of these lymph nodes are calcified, compatible with sarcoidosis. Subcarinal node 1.9 cm, previously 1.8 cm.  Lungs/Pleura: Severe architectural distortion and scattered reticulonodular interstitial opacities. Severe airway thickening in  the right upper lobe common nearly occluded, with a large cavitary process posteriorly in the right upper lobe containing an air-fluid level. The cavitary process is smaller but the air- fluid level is new.  Extensive reticulonodular opacities in both lower lobes. Confluent airspace opacities in the left perihilar region with severe proximal bronchial narrowing in the upper lobe. Peribronchovascular nodularity and airway narrowing in the superior segment right lower lobe and in the right middle lobe as well. This airway narrowing is generally similar to prior exam from April. The left lower lobe airspace opacity is increased significantly in the periphery of the lung, and superimposed pneumonia is a distinct possibility in the left lower lobe.  Severe emphysema.  Upper Abdomen: Stable upper abdominal lymph nodes.  Musculoskeletal: Unremarkable  Review of the MIP images confirms the above findings.  IMPRESSION: 1. No pulmonary embolus identified. Reduced sensitivity for small emboli due to non ideal bolus timing. 2. Severe architectural distortion in both lungs with extensive findings of sarcoidosis superimposed on emphysema. There is also extensive increase in airspace opacity in the left lower lobe suspicious for superimposed acute pneumonia. 3. The large cavitary lesion posteriorly in  the right upper lobe is reduced in size but has a new air-fluid level -this was previously completely air-filled. 4. Scattered adenopathy characteristic of sarcoidosis. 5. Left ventricular hypertrophy. 6. Prominent bilateral airway narrowing, similar to the April 2018 exam. 7. Aortic Atherosclerosis (ICD10-I70.0) and Emphysema (ICD10-J43.9).      Assessment & Plan:  OSA on CPAP CPap as ordered  Acute on chronic respiratory failure with hypoxia (HCC) Progressive hypoxemia in the setting of acute exacerbation  Bronchiectasis with (acute) exacerbation (HCC) Acute flare and possibly a coexisting pneumonia.  Likely exacerbated by his recent cocaine use.  Based on his improvement while being followed in the office I suspect that he can be treated as an outpatient.  If he is not improving he will call us and we will arrange for admission to the hospital.  Labs today: CBC, BMP, troponin I CXR today Stop symbicort for now Start taking your DuoNeb 4x a day on a schedule until next visit.  Take levaquin 750mg  once a day for 7 days Take prednisone as directed until completely gone.  Follow with Dr Delton CoombesByrum or APP in 1 week  Levy Pupaobert Klaus Casteneda, MD, PhD 02/03/2017, 2:51 PM [facility] [phone] or if no answer [phone]

## 2017-02-03 NOTE — Assessment & Plan Note (Signed)
Acute flare and possibly a coexisting pneumonia.  Likely exacerbated by his recent cocaine use.  Based on his improvement while being followed in the office I suspect that he can be treated as an outpatient.  If he is not improving he will call us and we will arrange for admission to the hospital.  Labs today: CBC, BMP, troponin I CXR today Stop symbicort for now Start taking your DuoNeb 4x a day on a schedule until next visit.  Take levaquin 750mg  once a day for 7 days Take prednisone as directed until completely gone.  Follow with Dr Delton CoombesByrum or APP in 1 week

## 2017-02-10 ENCOUNTER — Encounter: Payer: Self-pay | Admitting: Acute Care

## 2017-02-10 ENCOUNTER — Ambulatory Visit (INDEPENDENT_AMBULATORY_CARE_PROVIDER_SITE_OTHER): Payer: 59 | Admitting: Acute Care

## 2017-02-10 ENCOUNTER — Telehealth: Payer: Self-pay | Admitting: Emergency Medicine

## 2017-02-10 VITALS — BP 136/84 | HR 100 | Ht 67.5 in | Wt 143.0 lb

## 2017-02-10 DIAGNOSIS — J471 Bronchiectasis with (acute) exacerbation: Secondary | ICD-10-CM | POA: Diagnosis not present

## 2017-02-10 DIAGNOSIS — J9611 Chronic respiratory failure with hypoxia: Secondary | ICD-10-CM | POA: Diagnosis not present

## 2017-02-10 NOTE — Telephone Encounter (Signed)
Patrice was not aware of any paperwork for this patient either.

## 2017-02-10 NOTE — Assessment & Plan Note (Signed)
Continue oxygen at 4 L nasal cannula with exertion, 3 L with rest Oxygen saturations are greater 88-92%

## 2017-02-10 NOTE — Telephone Encounter (Signed)
Lillia AbedLindsay, please advise if you have seen any form on this patient.  Thanks!

## 2017-02-10 NOTE — Assessment & Plan Note (Signed)
Resolving after treatment with Levaquin and prednisone Plan Continue Mucinex as you have been doing. Keep doing the Duonebs without the Symbicort for another 2 weeks until you follow up with us in 2 weeks. Rinse mouth after use. Finish prednisone and Levaquin as prescribed by Dr. Delton CoombesByrum. We will get you a new flutter valve. Make sure you wash your flutter valve every few days with soapy water and dry completely. We will check to see if we have any incentive spirometers here today. Continue oxygen at 4 L [place] with exertion and 3 L with rest Saturation goals are 88-92% Follow up in 2 weeks with NP or Dr. Delton CoombesByrum to ensure continued improvement Please contact office for sooner follow up if symptoms do not improve or worsen or seek emergency care

## 2017-02-10 NOTE — Assessment & Plan Note (Deleted)
Continue oxygen. 

## 2017-02-10 NOTE — Telephone Encounter (Signed)
I will notify pt letting him know we are awaiting the form back from RB.  Thanks!  Called pt letting him know the status of the form that we are awaiting it back from RB.  Pt expressed understanding. Nothing further needed at this current time.

## 2017-02-10 NOTE — Progress Notes (Signed)
History of Present Illness [name] is a 52 y.o. male former smoker with stage IV sarcoidosis, ILD and Emphasema. He is on chronic oxygen. He is followed by Dr. Delton Coombes.  HPI           [name] is a 52 year old man with a history of stage IV sarcoidosis and associated interstitial disease superimposed on emphysematous change.  He has a history of cocaine abuse which often coincides with decompensation of his lung disease and his exacerbations.  He has chronic hypoxemic respiratory failure and obstructive sleep apnea that has been treated with CPAP.  He has used the chest vest in the past for mucociliary clearance.  Current bronchodilators are Symbicort, DuoNeb that he is using about 3x a day.    02/10/2017 Follow up after Acute OV for chest pain. Pt. Was seen by Dr. Delton Coombes 02/03/2017 for chest pain. ECG 02/03/2017 > no ST changes per Dr. Neville Route evaluation, py with decompensation of pulmonary function following cocaine use the Saturday prior to the OV. .Diagnosis and plan after that OV was as follows::  Acute on chronic respiratory failure with hypoxia (HCC) Progressive hypoxemia in the setting of acute exacerbation  Bronchiectasis with (acute) exacerbation (HCC) Acute flare and possibly a coexisting pneumonia.  Likely exacerbated by his recent cocaine use.  Based on his improvement while being followed in the office I suspect that he can be treated as an outpatient.  If he is not improving he will call us and we will arrange for admission to the hospital.  Labs today: CBC, BMP, troponin I CXR today Stop symbicort for now Start taking your DuoNeb 4x a day on a schedule until next visit.  Take levaquin 750mg  once a day for 7 days Take prednisone as directed until completely gone.  Follow with Dr Delton Coombes or APP in 1 week : Pt. Presents today for follow up. He states he is better. He denies any fever,chest pain, orthopnea or hemoptysis.He states his secretions are slightly yellow, and thick.  He is compliant with his Mucinex, chest vest  and flutter valve. He states his cough is better and his shortness of breath is better. He is currently using his Duonebs and feels that works better for him than the Symbicort. He has one more Levaquin to complete. He states he completed his prednisone taper also.   Test Results  CXR 02/03/2017>> IMPRESSION: Stable chronic changes consistent with the given clinical history. No significant change from the prior CT is noted. Troponin 02/03/2017>> 0.01 CBC and BMET WNL   CBC Latest Ref Rng & Units 02/03/2017 10/13/2016 10/12/2016  WBC 4.0 - 10.5 K/uL 6.7 4.3 5.5  Hemoglobin 13.0 - 17.0 g/dL 16.1 10.1(L) 10.9(L)  Hematocrit 39.0 - 52.0 % 40.7 32.2(L) 34.2(L)  Platelets 150.0 - 400.0 K/uL 372.0 274 245    BMP Latest Ref Rng & Units 02/03/2017 10/13/2016 10/12/2016  Glucose 70 - 99 mg/dL 98 096(E) 454(U)  BUN 6 - 23 mg/dL 14 10 11   Creatinine 0.40 - 1.50 mg/dL 9.81 1.91 4.78  Sodium 135 - 145 mEq/L 140 139 139  Potassium 3.5 - 5.1 mEq/L 3.7 3.8 4.0  Chloride 96 - 112 mEq/L 98 101 103  CO2 19 - 32 mEq/L 34(H) 32 29  Calcium 8.4 - 10.5 mg/dL 9.9 2.9(F) 6.2(Z)    BNP    Component Value Date/Time   BNP 32.2 06/20/2016 0154    ProBNP    Component Value Date/Time   PROBNP 97.5 11/23/2013 1458  PFT No results found for: FEV1PRE, FEV1POST, FVCPRE, FVCPOST, TLC, DLCOUNC, PREFEV1FVCRT, PSTFEV1FVCRT  Dg Chest 2 View  Result Date: 02/03/2017 CLINICAL DATA:  Chronic cough and shortness of breath increasing over the past few weeks, history of sarcoid EXAM: CHEST  2 VIEW COMPARISON:  10/11/2016 FINDINGS: Cardiac shadow is stable. Diffuse fibrotic changes are again identified throughout both lungs. The overall appearance is stable from the prior CT examination. No new focal abnormality is seen. No bony abnormality is noted. IMPRESSION: Stable chronic changes consistent with the given clinical history. No significant change from the prior CT is  noted. Electronically Signed   By: Alcide Clever M.D.   On: 02/03/2017 15:22     Past medical hx Past Medical History:  Diagnosis Date  . Bronchitis   . CHF (congestive heart failure) (HCC)   . Chronic respiratory failure (HCC)    home O@ 4L (01/28/14)  . Chronic respiratory failure (HCC)   . COPD (chronic obstructive pulmonary disease) (HCC)   . GERD (gastroesophageal reflux disease)   . Hypertension   . Neuromuscular disorder (HCC)   . OSA on CPAP    ramps 5 to 10  . Pneumonia   . Sarcoidosis      Social History   Tobacco Use  . Smoking status: Never Smoker  . Smokeless tobacco: Never Used  Substance Use Topics  . Alcohol use: Yes    Comment: social 3-4 times weekly  . Drug use: Yes    Types: "Crack" cocaine    [name] reports that  has never smoked. he has never used smokeless tobacco. He reports that he drinks alcohol. He reports that he uses drugs. Drug: "Crack" cocaine.  Tobacco Cessation: Former smoker  Past surgical hx, Family hx, Social hx all reviewed.  Current Outpatient Medications on File Prior to Visit  Medication Sig  . albuterol (PROVENTIL HFA;VENTOLIN HFA) 108 (90 Base) MCG/ACT inhaler Inhale 2 puffs into the lungs every 6 (six) hours as needed for wheezing or shortness of breath.  Marland Kitchen albuterol (PROVENTIL) (2.5 MG/3ML) 0.083% nebulizer solution USE 1 VIAL IN NEBULIZER EVERY 6 HOURS AS NEEDED FOR WHEEZING  . calcium-vitamin D (OSCAL WITH D) 500-200 MG-UNIT tablet Take 1 tablet by mouth daily.  Marland Kitchen CARTIA XT 120 MG 24 hr capsule Take 120 mg by mouth every morning.  . chlorthalidone (HYGROTON) 25 MG tablet Take 25 mg by mouth daily.  . cyclobenzaprine (FLEXERIL) 10 MG tablet Take 20 mg by mouth 3 (three) times daily as needed for muscle spasms.   . diphenhydrAMINE (BENADRYL) 25 MG tablet Take 25 mg by mouth every 6 (six) hours as needed for itching or allergies.  . Ergocalciferol (VITAMIN D2) 400 units TABS Take 400 Units by mouth daily.  . ferrous  sulfate 325 (65 FE) MG tablet Take 325 mg by mouth 2 (two) times daily with a meal.  . guaiFENesin (MUCINEX) 600 MG 12 hr tablet Take 1 tablet (600 mg total) by mouth 2 (two) times daily.  Marland Kitchen ipratropium-albuterol (DUONEB) 0.5-2.5 (3) MG/3ML SOLN Take 3 mLs by nebulization every 4 (four) hours as needed. (Patient taking differently: Take 3 mLs by nebulization every 4 (four) hours as needed (SOB, wheezing). )  . Multiple Vitamins-Minerals (MULTIVITAMIN WITH MINERALS) tablet Take 1 tablet by mouth every morning.   Marland Kitchen omeprazole (PRILOSEC) 40 MG capsule Take 40 mg by mouth daily.   . ondansetron (ZOFRAN) 4 MG tablet Take 1 tablet (4 mg total) by mouth every 8 (eight) hours as needed for  nausea or vomiting.  . OXYGEN Inhale 4 L into the lungs continuous.  . predniSONE (DELTASONE) 10 MG tablet Take 40mg  for 3 days, then 30mg  for 3 days, 20mg  for 3 days, 10mg  for 3 days, then stop  . saline (AYR) GEL Place 1 application into the nose every 4 (four) hours as needed (dryness/irritation).  . SYMBICORT 160-4.5 MCG/ACT inhaler Inhale 1 puff into the lungs 2 (two) times daily.   No current facility-administered medications on file prior to visit.      Allergies  Allergen Reactions  . Clindamycin/Lincomycin Anaphylaxis and Swelling    Made face and tongue swell    Review Of Systems:  Constitutional:   No  weight loss, night sweats,  Fevers, chills, fatigue, or  lassitude.  HEENT:   No headaches,  Difficulty swallowing,  Tooth/dental problems, or  Sore throat,                No sneezing, itching, ear ache, nasal congestion, post nasal drip,   CV:  No chest pain,  Orthopnea, PND, swelling in lower extremities, anasarca, dizziness, palpitations, syncope.   GI  No heartburn, indigestion, abdominal pain, nausea, vomiting, diarrhea, change in bowel habits, loss of appetite, bloody stools.   Resp: +  Baseline shortness of breath with exertion less at rest.  + excess mucus, + productive cough,  No  non-productive cough,  No coughing up of blood.  + change in color of mucus.  + wheezing.  No chest wall deformity  Skin: no rash or lesions.  GU: no dysuria, change in color of urine, no urgency or frequency.  No flank pain, no hematuria   MS:  No joint pain or swelling.  No decreased range of motion.  No back pain.  Psych:  No change in mood or affect. No depression or anxiety.  No memory loss.   Vital Signs BP 136/84 (BP Location: Left Arm, Cuff Size: Normal)   Pulse 100   Ht 5' 7.5" (1.715 m)   Wt 143 lb (64.9 kg)   SpO2 92%   BMI 22.07 kg/m    Physical Exam:  General- No distress,  A&Ox3, pleasant ENT: No sinus tenderness, TM clear, pale nasal mucosa, no oral exudate,no post nasal drip, no LAN Cardiac: S1, S2, regular rate and rhythm, no murmur Chest: + wheeze/ rales/ dullness; no accessory muscle use, no nasal flaring, no sternal retractions, prolonged expiratory phase, diminished per bases Abd.: Soft Non-tender, nondistended, bowel sounds positive, very thin Ext: No clubbing cyanosis, edema Neuro: Deconditioned at baseline, moving all extremities x4, alert and oriented x3 Skin: No rashes, warm and dry Psych: normal mood and behavior   Assessment/Plan  Bronchiectasis with (acute) exacerbation (HCC) Resolving after treatment with Levaquin and prednisone Plan Continue Mucinex as you have been doing. Keep doing the Duonebs without the Symbicort for another 2 weeks until you follow up with us in 2 weeks. Rinse mouth after use. Finish prednisone and Levaquin as prescribed by Dr. Delton CoombesByrum. We will get you a new flutter valve. Make sure you wash your flutter valve every few days with soapy water and dry completely. We will check to see if we have any incentive spirometers here today. Continue oxygen at 4 L [place] with exertion and 3 L with rest Saturation goals are 88-92% Follow up in 2 weeks with NP or Dr. Delton CoombesByrum to ensure continued improvement Please contact office for  sooner follow up if symptoms do not improve or worsen or seek emergency care  Chronic respiratory failure Continue oxygen at 4 L nasal cannula with exertion, 3 L with rest Oxygen saturations are greater 88-92%    Bevelyn Ngo, NP 02/10/2017  5:46 PM

## 2017-02-10 NOTE — Telephone Encounter (Signed)
This form has been given to RB but not has been returned to me.

## 2017-02-10 NOTE — Patient Instructions (Addendum)
It is good to see you tody. I am glad you are better. Continue Mucinex as you have been doing. Keep doing the Duonebs without the Symbicort for another 2 weeks until you follow up with us in 2 weeks. Rinse mouth after use. Finish prednisone and Levaquin as prescribed by Dr. Delton CoombesByrum. We will get you a new flutter valve. Make sure you wash your flutter valve every few days with soapy water and dry completely. We will check to see if we have any incentive spirometers here today. Continue oxygen at 4 L [place] with exertion and 3 L with rest Saturation goals are 88-92% Follow up in 2 weeks with NP or Dr. Delton CoombesByrum to ensure continued improvement Please contact office for sooner follow up if symptoms do not improve or worsen or seek emergency care

## 2017-02-21 ENCOUNTER — Other Ambulatory Visit: Payer: Self-pay | Admitting: Acute Care

## 2017-02-21 DIAGNOSIS — J9601 Acute respiratory failure with hypoxia: Secondary | ICD-10-CM

## 2017-02-21 DIAGNOSIS — J9611 Chronic respiratory failure with hypoxia: Secondary | ICD-10-CM

## 2017-02-21 MED ORDER — FLUTTER DEVI: 0 refills | Status: AC

## 2017-03-02 ENCOUNTER — Ambulatory Visit: Payer: Self-pay | Admitting: Acute Care

## 2017-03-02 NOTE — Progress Notes (Deleted)
History of Present Illness [name] is a 51 y.o. male with former smoker with stage IV sarcoidosis, ILD and Emphasema. He is on chronic oxygen. He is followed by Dr. Delton Coombes.  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 He has a history of cocaine abuse which often coincides with decompensation of his lung disease and his exacerbations. He has chronic hypoxemic respiratory failure and obstructive sleep apnea that has been treated with CPAP. He has used the chest vest in the past for mucociliary clearance. Current bronchodilators are Symbicort, DuoNeb that he is using about 3x a day.  03/02/2017 2 week follow up for flare: Pt. Present for follow up after Bronchiectasis Flare triggered by cocaine use. He was initially seen by Dr. Delton Coombes 02/03/2017 with decompensation of respiratory function . He was treated with  DuoNeb 4x a day ,levaquin 750mg  once a day for 7 days, and prednisone taper . He was seen for follow up 02/10/2017 to ensure he was improving, as there was concern he would need admission to the hospital. He was improving at the last visit. His flare was resolving after treatment with Levaquin and prednisone. Plan after 1/31visit was  To continue Mucinex for mucillary clearance,continue Duo Nebs in place of Symbicort for an additional 2 weeks until follow up, Flutter valve and IS for mucillary clearance, and to continue wearing oxygen  at 4 L [place] with exertion and 3 L with rest. Saturation goals are 88-92%, follow up in 2 weeks to ensure continued improvement. Pt. Presents today stating he is      Test Results:  CBC Latest Ref Rng & Units 02/03/2017 10/13/2016 10/12/2016  WBC 4.0 - 10.5 K/uL 6.7 4.3 5.5  Hemoglobin 13.0 - 17.0 g/dL 91.4 10.1(L) 10.9(L)  Hematocrit 39.0 - 52.0 % 40.7 32.2(L) 34.2(L)  Platelets 150.0 - 400.0 K/uL 372.0 274 245    BMP Latest Ref Rng & Units  02/03/2017 10/13/2016 10/12/2016  Glucose 70 - 99 mg/dL 98 782(N) 562(Z)  BUN 6 - 23 mg/dL 14 10 11   Creatinine 0.40 - 1.50 mg/dL 3.08 6.57 8.46  Sodium 135 - 145 mEq/L 140 139 139  Potassium 3.5 - 5.1 mEq/L 3.7 3.8 4.0  Chloride 96 - 112 mEq/L 98 101 103  CO2 19 - 32 mEq/L 34(H) 32 29  Calcium 8.4 - 10.5 mg/dL 9.9 9.6(E) 9.5(M)    BNP    Component Value Date/Time   BNP 32.2 06/20/2016 0154    ProBNP    Component Value Date/Time   PROBNP 97.5 11/23/2013 1458    PFT No results found for: FEV1PRE, FEV1POST, FVCPRE, FVCPOST, TLC, DLCOUNC, PREFEV1FVCRT, PSTFEV1FVCRT  Dg Chest 2 View  Result Date: 02/03/2017 CLINICAL DATA:  Chronic cough and shortness of breath increasing over the past few weeks, history of sarcoid EXAM: CHEST  2 VIEW COMPARISON:  10/11/2016 FINDINGS: Cardiac shadow is stable. Diffuse fibrotic changes are again identified throughout both lungs. The overall appearance is stable from the prior CT examination. No new focal abnormality is seen. No bony abnormality is noted. IMPRESSION: Stable chronic changes consistent with the given clinical history. No significant change from the prior CT is noted. Electronically Signed   By: Alcide Clever M.D.   On: 02/03/2017 15:22     Past medical hx Past Medical History:  Diagnosis Date  . Bronchitis   . CHF (congestive heart failure) (HCC)   . Chronic respiratory failure (HCC)  home O@ 4L (01/28/14)  . Chronic respiratory failure (HCC)   . COPD (chronic obstructive pulmonary disease) (HCC)   . GERD (gastroesophageal reflux disease)   . Hypertension   . Neuromuscular disorder (HCC)   . OSA on CPAP    ramps 5 to 10  . Pneumonia   . Sarcoidosis      Social History   Tobacco Use  . Smoking status: Never Smoker  . Smokeless tobacco: Never Used  Substance Use Topics  . Alcohol use: Yes    Comment: social 3-4 times weekly  . Drug use: Yes    Types: "Crack" cocaine    [name] reports that  has never smoked. he has  never used smokeless tobacco. He reports that he drinks alcohol. He reports that he uses drugs. Drug: "Crack" cocaine.  Tobacco Cessation: Counseling given: Not Answered   Past surgical hx, Family hx, Social hx all reviewed.  Current Outpatient Medications on File Prior to Visit  Medication Sig  . albuterol (PROVENTIL HFA;VENTOLIN HFA) 108 (90 Base) MCG/ACT inhaler Inhale 2 puffs into the lungs every 6 (six) hours as needed for wheezing or shortness of breath.  Marland Kitchen albuterol (PROVENTIL) (2.5 MG/3ML) 0.083% nebulizer solution USE 1 VIAL IN NEBULIZER EVERY 6 HOURS AS NEEDED FOR WHEEZING  . calcium-vitamin D (OSCAL WITH D) 500-200 MG-UNIT tablet Take 1 tablet by mouth daily.  Marland Kitchen CARTIA XT 120 MG 24 hr capsule Take 120 mg by mouth every morning.  . chlorthalidone (HYGROTON) 25 MG tablet Take 25 mg by mouth daily.  . cyclobenzaprine (FLEXERIL) 10 MG tablet Take 20 mg by mouth 3 (three) times daily as needed for muscle spasms.   . diphenhydrAMINE (BENADRYL) 25 MG tablet Take 25 mg by mouth every 6 (six) hours as needed for itching or allergies.  . Ergocalciferol (VITAMIN D2) 400 units TABS Take 400 Units by mouth daily.  . ferrous sulfate 325 (65 FE) MG tablet Take 325 mg by mouth 2 (two) times daily with a meal.  . guaiFENesin (MUCINEX) 600 MG 12 hr tablet Take 1 tablet (600 mg total) by mouth 2 (two) times daily.  Marland Kitchen ipratropium-albuterol (DUONEB) 0.5-2.5 (3) MG/3ML SOLN Take 3 mLs by nebulization every 4 (four) hours as needed. (Patient taking differently: Take 3 mLs by nebulization every 4 (four) hours as needed (SOB, wheezing). )  . Multiple Vitamins-Minerals (MULTIVITAMIN WITH MINERALS) tablet Take 1 tablet by mouth every morning.   Marland Kitchen omeprazole (PRILOSEC) 40 MG capsule Take 40 mg by mouth daily.   . ondansetron (ZOFRAN) 4 MG tablet Take 1 tablet (4 mg total) by mouth every 8 (eight) hours as needed for nausea or vomiting.  . OXYGEN Inhale 4 L into the lungs continuous.  . predniSONE (DELTASONE)  10 MG tablet Take 40mg  for 3 days, then 30mg  for 3 days, 20mg  for 3 days, 10mg  for 3 days, then stop  . Respiratory Therapy Supplies (FLUTTER) DEVI As directed.  . saline (AYR) GEL Place 1 application into the nose every 4 (four) hours as needed (dryness/irritation).  . SYMBICORT 160-4.5 MCG/ACT inhaler Inhale 1 puff into the lungs 2 (two) times daily.   No current facility-administered medications on file prior to visit.      Allergies  Allergen Reactions  . Clindamycin/Lincomycin Anaphylaxis and Swelling    Made face and tongue swell    Review Of Systems:  Constitutional:   No  weight loss, night sweats,  Fevers, chills, fatigue, or  lassitude.  HEENT:   No headaches,  Difficulty swallowing,  Tooth/dental problems, or  Sore throat,                No sneezing, itching, ear ache, nasal congestion, post nasal drip,   CV:  No chest pain,  Orthopnea, PND, swelling in lower extremities, anasarca, dizziness, palpitations, syncope.   GI  No heartburn, indigestion, abdominal pain, nausea, vomiting, diarrhea, change in bowel habits, loss of appetite, bloody stools.   Resp: No shortness of breath with exertion or at rest.  No excess mucus, no productive cough,  No non-productive cough,  No coughing up of blood.  No change in color of mucus.  No wheezing.  No chest wall deformity  Skin: no rash or lesions.  GU: no dysuria, change in color of urine, no urgency or frequency.  No flank pain, no hematuria   MS:  No joint pain or swelling.  No decreased range of motion.  No back pain.  Psych:  No change in mood or affect. No depression or anxiety.  No memory loss.   Vital Signs There were no vitals taken for this visit.   Physical Exam:  General- No distress,  A&Ox3 ENT: No sinus tenderness, TM clear, pale nasal mucosa, no oral exudate,no post nasal drip, no LAN Cardiac: S1, S2, regular rate and rhythm, no murmur Chest: No wheeze/ rales/ dullness; no accessory muscle use, no nasal  flaring, no sternal retractions Abd.: Soft Non-tender Ext: No clubbing cyanosis, edema Neuro:  normal strength Skin: No rashes, warm and dry Psych: normal mood and behavior   Assessment/Plan  No problem-specific Assessment & Plan notes found for this encounter.    [name] NgoSarah F Jamaury Gumz, NP 03/02/2017  11:34 AM

## 2017-03-10 ENCOUNTER — Encounter: Payer: Self-pay | Admitting: Acute Care

## 2017-03-10 ENCOUNTER — Ambulatory Visit (INDEPENDENT_AMBULATORY_CARE_PROVIDER_SITE_OTHER)
Admission: RE | Admit: 2017-03-10 | Discharge: 2017-03-10 | Disposition: A | Discharge status: Home / Self Care | Payer: 59 | Source: Ambulatory Visit | Attending: Acute Care | Admitting: Acute Care

## 2017-03-10 ENCOUNTER — Encounter (HOSPITAL_COMMUNITY): Payer: Self-pay | Admitting: Emergency Medicine

## 2017-03-10 ENCOUNTER — Ambulatory Visit (INDEPENDENT_AMBULATORY_CARE_PROVIDER_SITE_OTHER): Payer: 59 | Admitting: Acute Care

## 2017-03-10 ENCOUNTER — Emergency Department (HOSPITAL_COMMUNITY)
Admission: EM | Admit: 2017-03-10 | Discharge: 2017-03-11 | Disposition: A | Discharge status: Home / Self Care | Payer: 59 | Attending: Emergency Medicine | Admitting: Emergency Medicine

## 2017-03-10 VITALS — BP 112/72 | HR 114 | Ht 67.5 in | Wt 140.0 lb

## 2017-03-10 DIAGNOSIS — R0600 Dyspnea, unspecified: Secondary | ICD-10-CM

## 2017-03-10 DIAGNOSIS — J9611 Chronic respiratory failure with hypoxia: Secondary | ICD-10-CM

## 2017-03-10 DIAGNOSIS — J471 Bronchiectasis with (acute) exacerbation: Secondary | ICD-10-CM

## 2017-03-10 DIAGNOSIS — Z5321 Procedure and treatment not carried out due to patient leaving prior to being seen by health care provider: Secondary | ICD-10-CM | POA: Insufficient documentation

## 2017-03-10 DIAGNOSIS — R634 Abnormal weight loss: Secondary | ICD-10-CM | POA: Diagnosis not present

## 2017-03-10 NOTE — ED Notes (Signed)
Pt states he has an appointment with his pcp tomorrow, and is leaving

## 2017-03-10 NOTE — ED Triage Notes (Signed)
Patient c/o loosing forty pounds since October. Reports polydipsia and polyuria since January. Hx sarcoidosis. 3-4L [place] baseline. Patient denies increased SOB and chest pain. States "with my other problems I just wanted to be checked out for my weight loss." Report he is unable to be seen by PCP until May.

## 2017-03-10 NOTE — Assessment & Plan Note (Signed)
Continuous oxygen as needed Sat goal 88-92%

## 2017-03-10 NOTE — Assessment & Plan Note (Signed)
Improved after treatment with Levaquin and Prednisone Precipitated by cocaine use Plan: Follow up CXR today We will call you with results Continue Duonebs and Symbicort as you had previously been doing Continue Flutter valve and mucinex as you have been doing. Continue incentive spirometer as you have been doing. Continue oxygen at 4 L [place] with exertion and 3 L with rest Saturation goals are 88-92% Add Boost or Ensure for calorie supplementation and weight gain. Follow up with PCP re: desire for work up for Diabetes. Follow up with Dr. Delton CoombesByrum in 3 months Please contact office for sooner follow up if symptoms do not improve or worsen or seek emergency care

## 2017-03-10 NOTE — Patient Instructions (Addendum)
Follow up CXR today We will call you with results Continue Duonebs and Symbicort as you had previously been doing Continue Flutter valve and mucinex as you have been doing. Continue incentive spirometer as you have been doing. Continue oxygen at 4 L [place] with exertion and 3 L with rest Saturation goals are 88-92% Add Boost or Ensure for calorie supplementation and weight gain. Follow up with PCP re: desire for work up for Diabetes. Follow up with Dr. Delton CoombesByrum in 3 months Please contact office for sooner follow up if symptoms do not improve or worsen or seek emergency care

## 2017-03-10 NOTE — Progress Notes (Signed)
History of Present Illness [name] is a 52 y.o. male  former smoker with stage IV sarcoidosis, ILD and Emphasema. He is on chronic oxygen. He is followed by Dr. Delton Coombes.  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 He has a history of cocaine abuse which often coincides with decompensation of his lung disease and his exacerbations. He has chronic hypoxemic respiratory failure and obstructive sleep apnea that has been treated with CPAP. He has used the chest vest in the past for mucociliary clearance. Current bronchodilators are Symbicort, DuoNeb that he is using about 3x a day.  02/10/2017 Follow up after Acute OV for chest pain. Pt. Was seen by Dr. Delton Coombes 02/03/2017 for chest pain. ECG 02/03/2017 > no ST changes per Dr. Neville Route evaluation, py with decompensation of pulmonary function following cocaine use the Saturday prior to the OV. .Diagnosis and plan after that OV was as follows::  Acute on chronic respiratory failure with hypoxia (HCC) Progressive hypoxemia in the setting of acute exacerbation  Bronchiectasis with (acute) exacerbation (HCC) Acute flare and possibly a coexisting pneumonia. Likely exacerbated by his recent cocaine use. Based on his improvement while being followed in the office I suspect that he can be treated as an outpatient. If he is not improving he will call us and we will arrange for admission to the hospital.  Labs today: CBC, BMP, troponin I CXR today Stop symbicort for now Start taking your DuoNeb 4x a day on a schedule until next visit.  Take levaquin 750mg  once a day for 7 days Take prednisone as directed until completely gone.  Follow with Dr Delton Coombes or APP in 1 week  03/10/2017 2 week follow up: Last seen 02/10/2017>>  Assessment and plan after that visit:  Bronchiectasis with (acute) exacerbation (HCC) Resolving after treatment  with Levaquin and prednisone Plan Continue Mucinex as you have been doing. Keep doing the Duonebs without the Symbicort for another 2 weeks until you follow up with [place] in 2 weeks. Rinse mouth after use. Finish prednisone and Levaquin as prescribed by Dr. Delton Coombes. We will get you a new flutter valve. Make sure you wash your flutter valve every few days with soapy water and dry completely. We will check to see if we have any incentive spirometers here today. Continue oxygen at 4 L [place] with exertion and 3 L with rest Saturation goals are 88-92% Follow up in 2 weeks with NP or Dr. Delton Coombes to ensure continued improvement Please contact office for sooner follow up if symptoms do not improve or worsen or seek emergency care    Chronic respiratory failure Continue oxygen at 4 L nasal cannula with exertion, 3 L with rest Oxygen saturations are greater 88-92%  Pt. Presents for follow up 03/10/2017>> Arrived at the office 15 minutes late for appointment. Pt. Presents for follow up. He was last seen 02/10/2017. He completed his Levaquin and prednisone taper.He states he has been using his flutter valve. He states he is doing well. He states he still has th cough. He states he has yellow secretions. He states he has no fever, chest pain, ororthopnea . He states he did notice a light streak of blood in his sputum this morning that he thinks is due to straining  to cough. He is requesting free samples of Mucinex today in the office.He states he is compliant with Mucinex and flutter valve. He wants to be worked up  for DM. He has had weight loss.I have asked him to follow up with his PCP. He states   Test Results: CXR 02/03/2017>> IMPRESSION: Stable chronic changes consistent with the given clinical history. No significant change from the prior CT is noted. Troponin 02/03/2017>> 0.01 CBC and BMET WNL  CBC Latest Ref Rng & Units 02/03/2017 10/13/2016 10/12/2016  WBC 4.0 - 10.5 K/uL 6.7 4.3 5.5  Hemoglobin 13.0  - 17.0 g/dL 60.4 10.1(L) 10.9(L)  Hematocrit 39.0 - 52.0 % 40.7 32.2(L) 34.2(L)  Platelets 150.0 - 400.0 K/uL 372.0 274 245    BMP Latest Ref Rng & Units 02/03/2017 10/13/2016 10/12/2016  Glucose 70 - 99 mg/dL 98 540(J) 811(B)  BUN 6 - 23 mg/dL 14 10 11   Creatinine 0.40 - 1.50 mg/dL 1.47 8.29 5.62  Sodium 135 - 145 mEq/L 140 139 139  Potassium 3.5 - 5.1 mEq/L 3.7 3.8 4.0  Chloride 96 - 112 mEq/L 98 101 103  CO2 19 - 32 mEq/L 34(H) 32 29  Calcium 8.4 - 10.5 mg/dL 9.9 1.3(Y) 8.6(V)    BNP    Component Value Date/Time   BNP 32.2 06/20/2016 0154    ProBNP    Component Value Date/Time   PROBNP 97.5 11/23/2013 1458    PFT No results found for: FEV1PRE, FEV1POST, FVCPRE, FVCPOST, TLC, DLCOUNC, PREFEV1FVCRT, PSTFEV1FVCRT  No results found.   Past medical hx Past Medical History:  Diagnosis Date  . Bronchitis   . CHF (congestive heart failure) (HCC)   . Chronic respiratory failure (HCC)    home O@ 4L (01/28/14)  . Chronic respiratory failure (HCC)   . COPD (chronic obstructive pulmonary disease) (HCC)   . GERD (gastroesophageal reflux disease)   . Hypertension   . Neuromuscular disorder (HCC)   . OSA on CPAP    ramps 5 to 10  . Pneumonia   . Sarcoidosis      Social History   Tobacco Use  . Smoking status: Never Smoker  . Smokeless tobacco: Never Used  Substance Use Topics  . Alcohol use: Yes    Comment: social 3-4 times weekly  . Drug use: Yes    Types: "Crack" cocaine    GavinRobotham reports that  has never smoked. he has never used smokeless tobacco. He reports that he drinks alcohol. He reports that he uses drugs. Drug: "Crack" cocaine.  Tobacco Cessation: Former smoker Recent crack cocaine use  Past surgical hx, Family hx, Social hx all reviewed.  Current Outpatient Medications on File Prior to Visit  Medication Sig  . albuterol (PROVENTIL HFA;VENTOLIN HFA) 108 (90 Base) MCG/ACT inhaler Inhale 2 puffs into the lungs every 6 (six) hours as needed for  wheezing or shortness of breath.  Marland Kitchen albuterol (PROVENTIL) (2.5 MG/3ML) 0.083% nebulizer solution USE 1 VIAL IN NEBULIZER EVERY 6 HOURS AS NEEDED FOR WHEEZING  . calcium-vitamin D (OSCAL WITH D) 500-200 MG-UNIT tablet Take 1 tablet by mouth daily.  Marland Kitchen CARTIA XT 120 MG 24 hr capsule Take 120 mg by mouth every morning.  . chlorthalidone (HYGROTON) 25 MG tablet Take 25 mg by mouth daily.  . cyclobenzaprine (FLEXERIL) 10 MG tablet Take 20 mg by mouth 3 (three) times daily as needed for muscle spasms.   . diphenhydrAMINE (BENADRYL) 25 MG tablet Take 25 mg by mouth every 6 (six) hours as needed for itching or allergies.  . Ergocalciferol (VITAMIN D2) 400 units TABS Take 400 Units by mouth daily.  . ferrous sulfate 325 (65 FE) MG tablet Take 325 mg  by mouth 2 (two) times daily with a meal.  . guaiFENesin (MUCINEX) 600 MG 12 hr tablet Take 1 tablet (600 mg total) by mouth 2 (two) times daily.  Marland Kitchen ipratropium-albuterol (DUONEB) 0.5-2.5 (3) MG/3ML SOLN Take 3 mLs by nebulization every 4 (four) hours as needed. (Patient taking differently: Take 3 mLs by nebulization every 4 (four) hours as needed (SOB, wheezing). )  . Multiple Vitamins-Minerals (MULTIVITAMIN WITH MINERALS) tablet Take 1 tablet by mouth every morning.   Marland Kitchen omeprazole (PRILOSEC) 40 MG capsule Take 40 mg by mouth daily.   . ondansetron (ZOFRAN) 4 MG tablet Take 1 tablet (4 mg total) by mouth every 8 (eight) hours as needed for nausea or vomiting.  . OXYGEN Inhale 4 L into the lungs continuous.  . predniSONE (DELTASONE) 10 MG tablet Take 40mg  for 3 days, then 30mg  for 3 days, 20mg  for 3 days, 10mg  for 3 days, then stop  . pregabalin (LYRICA) 200 MG capsule Take 400 mg by mouth daily.  Marland Kitchen Respiratory Therapy Supplies (FLUTTER) DEVI As directed.  . saline (AYR) GEL Place 1 application into the nose every 4 (four) hours as needed (dryness/irritation).  . SYMBICORT 160-4.5 MCG/ACT inhaler Inhale 1 puff into the lungs 2 (two) times daily.   No current  facility-administered medications on file prior to visit.      Allergies  Allergen Reactions  . Clindamycin/Lincomycin Anaphylaxis and Swelling    Made face and tongue swell    Review Of Systems:  Constitutional:   No  weight loss, night sweats,  Fevers, chills, fatigue, or  lassitude.  HEENT:   No headaches,  Difficulty swallowing,  Tooth/dental problems, or  Sore throat,                No sneezing, itching, ear ache, nasal congestion, post nasal drip,   CV:  No chest pain,  Orthopnea, PND, swelling in lower extremities, anasarca, dizziness, palpitations, syncope.   GI  No heartburn, indigestion, abdominal pain, nausea, vomiting, diarrhea, change in bowel habits, loss of appetite, bloody stools.   Resp: No+ baseline shortness of breath with exertion or at rest.  + excess mucus, + productive cough,  No non-productive cough,  No coughing up of blood.  + change in color of mucus.  No wheezing.  No chest wall deformity  Skin: no rash or lesions.  GU: no dysuria, change in color of urine, no urgency or frequency.  No flank pain, no hematuria   MS:  No joint pain or swelling.  No decreased range of motion.  No back pain.  Psych:  No change in mood or affect. No depression or anxiety.  No memory loss.   Vital Signs BP 112/72 (BP Location: Left Arm, Cuff Size: Normal)   Pulse (!) 114   Ht 5' 7.5" (1.715 m)   Wt 140 lb (63.5 kg)   SpO2 91%   BMI 21.60 kg/m    Physical Exam:  General- No distress,  A&Ox3, on [place] ENT: No sinus tenderness, TM clear, pale nasal mucosa, no oral exudate,no post nasal drip, no LAN Cardiac: S1, S2, regular rate and rhythm, no murmur Chest: + faint  wheeze/ No rales/ dullness; no accessory muscle use, no nasal flaring, no sternal retractions Abd.: Soft Non-tender, non-distended Ext: No clubbing cyanosis, edema Neuro:  Deconditioned at baseline, A&O x 3, MAE x 4 Skin: No rashes, warm and dry Psych: normal mood and  behavior   Assessment/Plan  Bronchiectasis with (acute) exacerbation (HCC) Improved after treatment  with Levaquin and Prednisone Precipitated by cocaine use Plan: Follow up CXR today We will call you with results Continue Duonebs and Symbicort as you had previously been doing Continue Flutter valve and mucinex as you have been doing. Continue incentive spirometer as you have been doing. Continue oxygen at 4 L [place] with exertion and 3 L with rest Saturation goals are 88-92% Add Boost or Ensure for calorie supplementation and weight gain. Follow up with PCP re: desire for work up for Diabetes. Follow up with Dr. Delton CoombesByrum in 3 months Please contact office for sooner follow up if symptoms do not improve or worsen or seek emergency care    Chronic respiratory failure with hypoxia (HCC) Continuous oxygen as needed Sat goal 88-92%    Bevelyn NgoSarah F Ladajah Soltys, NP 03/10/2017  12:55 PM

## 2017-03-24 IMAGING — CR DG CHEST 1V PORT
1 series · 1 of 1 positions shown · non-contrast
Comparison: None.

CLINICAL DATA: Severe dyspnea. History of CHF, sarcoid, COPD and
hypertension

EXAM:
PORTABLE CHEST 1 VIEW

[AP]
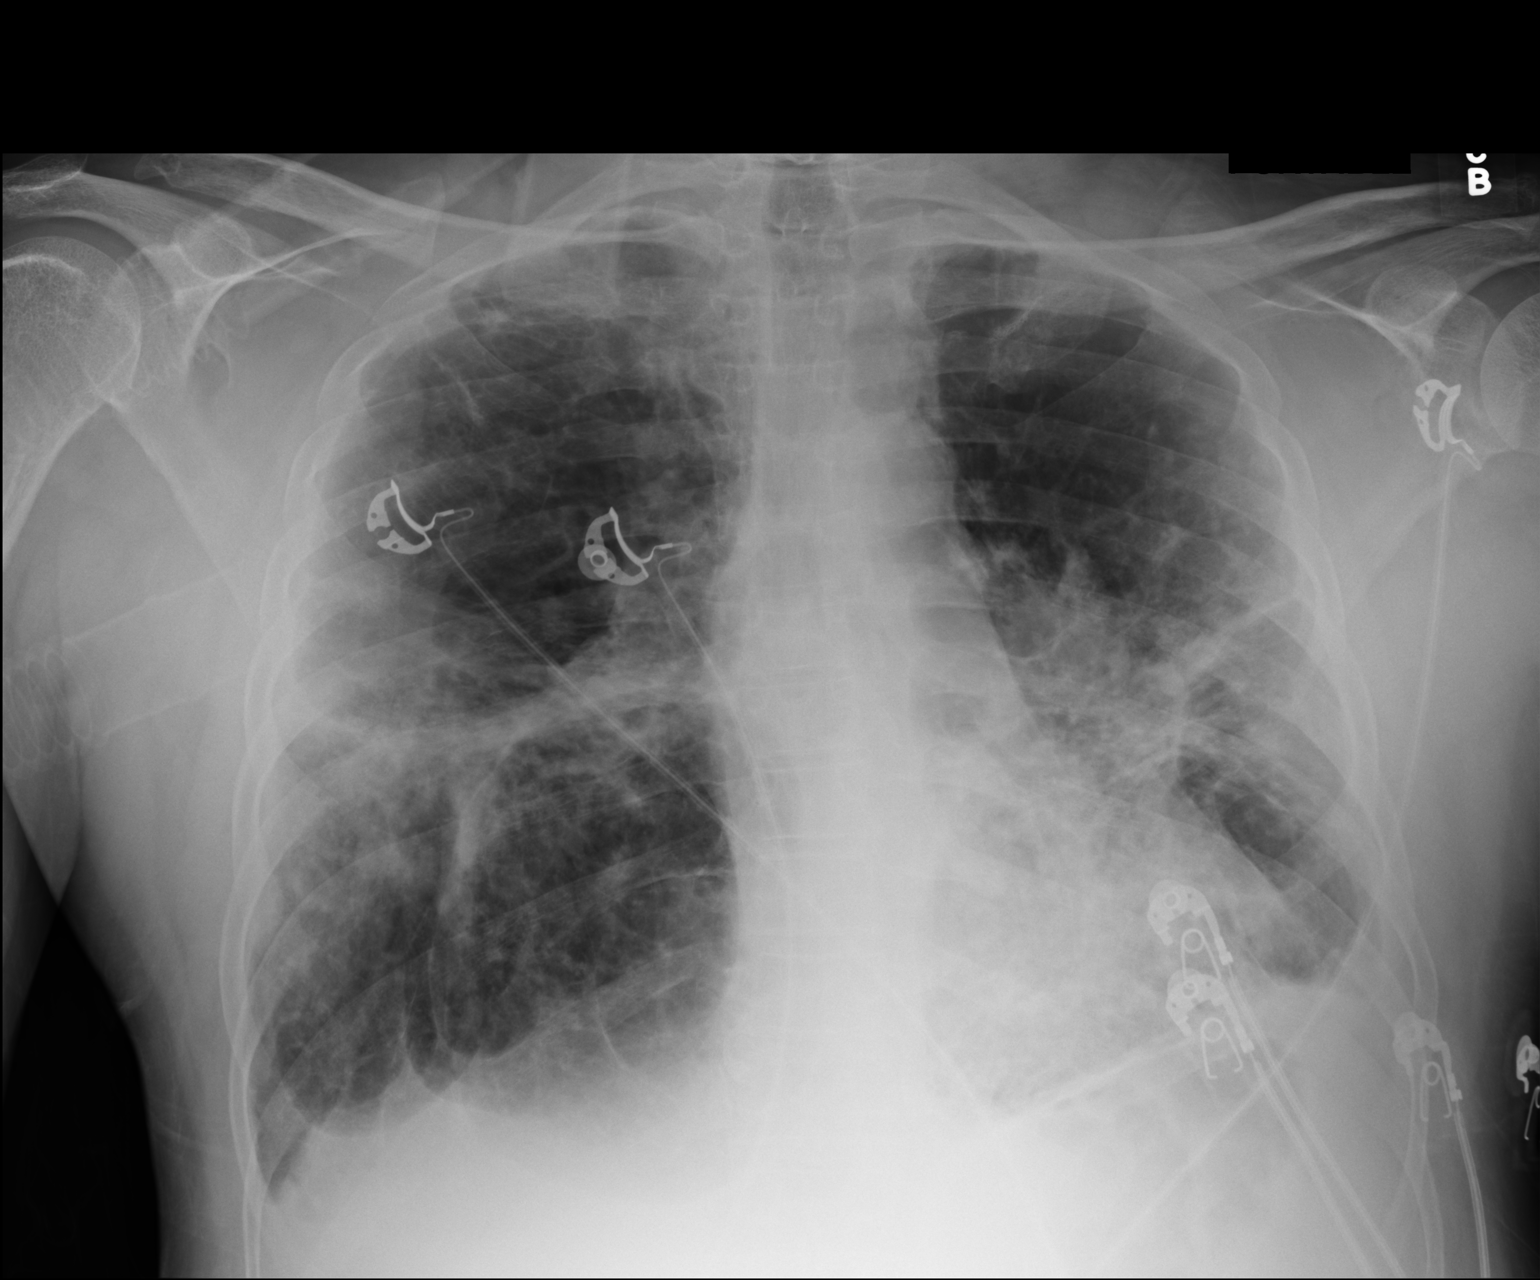

[1 of 1 positions shown; findings below may reference images not displayed]

FINDINGS: The heart is within normal limits for size. There is aortic
atherosclerosis. Streaky mid lung atelectasis and/or scarring noted
bilaterally with more confluent adjacent areas of pneumonic
consolidation in the right mid lung and left lower lobe. Probable
tiny left greater than right pleural effusions. No acute osseous
abnormality. Slight tapered appearance of the distal right clavicle,
nonspecific in etiology possibly related to prior surgery although
none is reported versus osteolysis or old posttraumatic change.
IMPRESSION: Pulmonary scarring bilaterally in the mid lung with adjacent areas
of pneumonic consolidation in the right mid lung is and left lung
base.

## 2017-03-25 IMAGING — DX DG CHEST 2V
2 series · 2 of 2 positions shown · non-contrast
Comparison: Portable chest x-ray January 12, 2016

CLINICAL DATA: Shortness of breath, history of COPD,
bronchiectasis, sarcoidosis, and CHF.

EXAM:
CHEST  2 VIEW

[x chest ap]
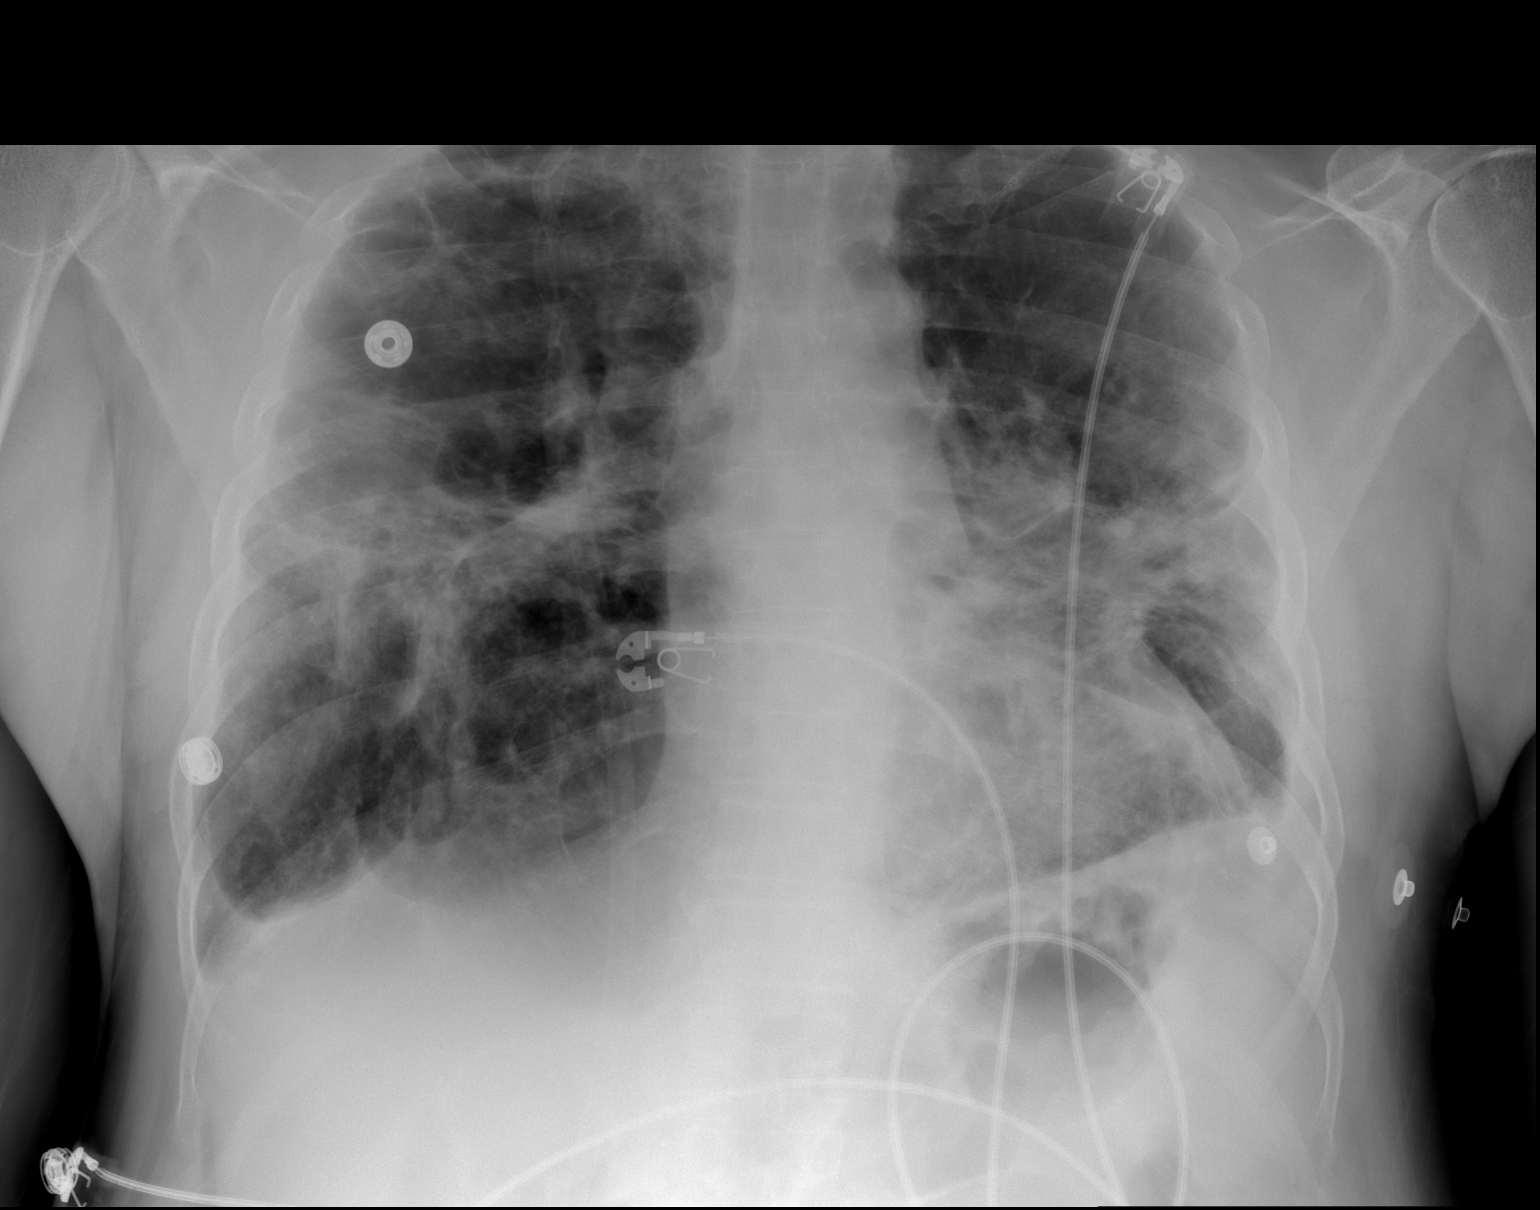

[w chest lat]
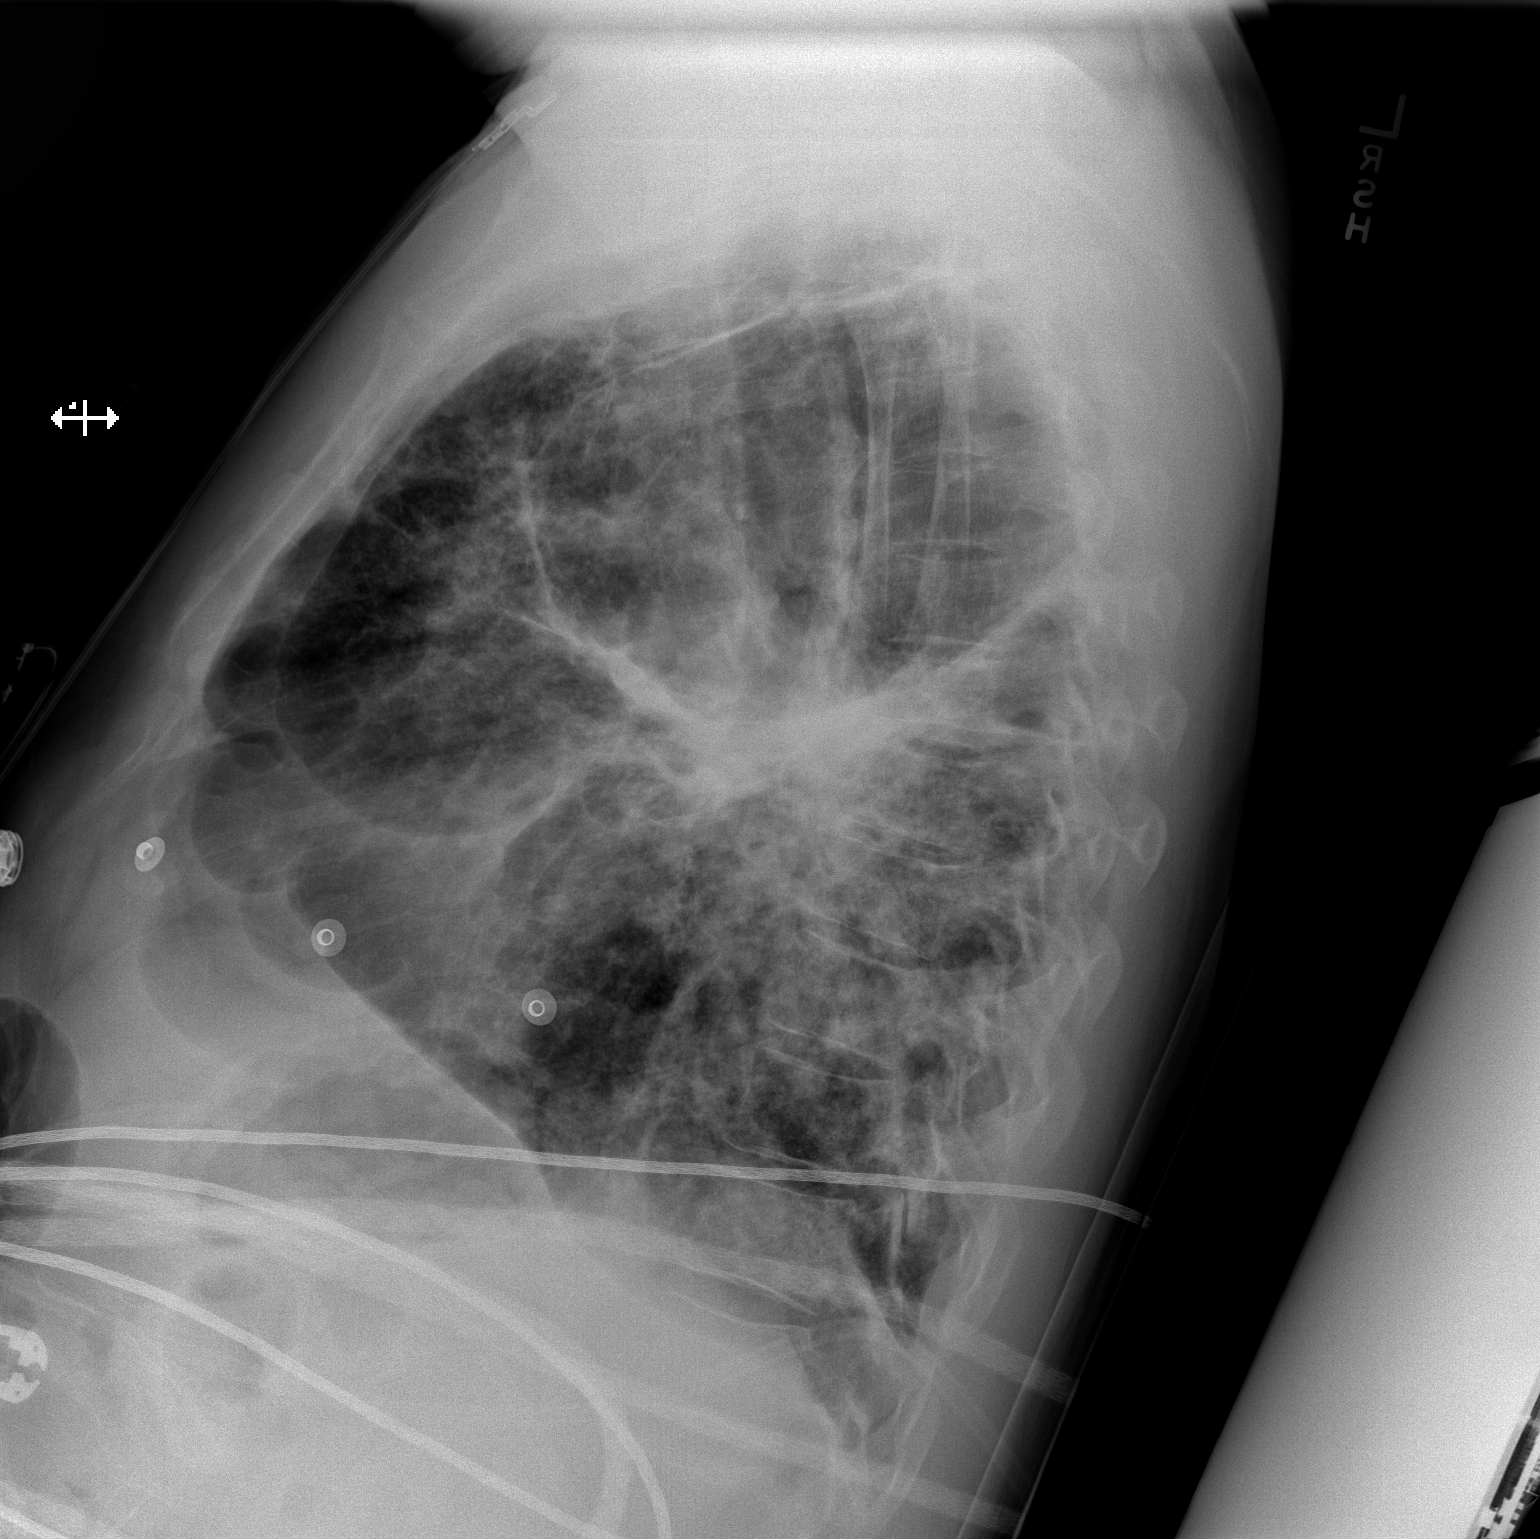

[2 of 2 positions shown; findings below may reference images not displayed]

FINDINGS: The lungs are adequately inflated. Confluent interstitial and
alveolar opacities persist bilaterally and are more conspicuous.
There may be a trace of pleural fluid. There is no pneumothorax or
pneumomediastinum. The heart is top-normal in size. The pulmonary
vascularity is normal. The trachea is midline. The bony thorax
exhibits no acute abnormality.
IMPRESSION: Slight interval increase in the conspicuity of alveolar opacities
bilaterally consistent with pneumonia superimposed upon chronic lung
disease. No CHF nor large pleural effusion.

## 2017-04-13 ENCOUNTER — Other Ambulatory Visit: Payer: Self-pay

## 2017-04-13 ENCOUNTER — Encounter (HOSPITAL_COMMUNITY): Payer: Self-pay

## 2017-04-13 ENCOUNTER — Emergency Department (HOSPITAL_COMMUNITY)
Admission: EM | Admit: 2017-04-13 | Discharge: 2017-04-13 | Disposition: A | Discharge status: Home / Self Care | Payer: 59 | Attending: Emergency Medicine | Admitting: Emergency Medicine

## 2017-04-13 ENCOUNTER — Emergency Department (HOSPITAL_COMMUNITY): Payer: 59

## 2017-04-13 DIAGNOSIS — J449 Chronic obstructive pulmonary disease, unspecified: Secondary | ICD-10-CM | POA: Diagnosis not present

## 2017-04-13 DIAGNOSIS — I110 Hypertensive heart disease with heart failure: Secondary | ICD-10-CM | POA: Insufficient documentation

## 2017-04-13 DIAGNOSIS — Z87891 Personal history of nicotine dependence: Secondary | ICD-10-CM | POA: Insufficient documentation

## 2017-04-13 DIAGNOSIS — R0600 Dyspnea, unspecified: Secondary | ICD-10-CM

## 2017-04-13 DIAGNOSIS — I509 Heart failure, unspecified: Secondary | ICD-10-CM | POA: Diagnosis not present

## 2017-04-13 DIAGNOSIS — Z79899 Other long term (current) drug therapy: Secondary | ICD-10-CM | POA: Insufficient documentation

## 2017-04-13 DIAGNOSIS — R079 Chest pain, unspecified: Secondary | ICD-10-CM | POA: Diagnosis not present

## 2017-04-13 LAB — CBC
HCT: 37.4 % — ABNORMAL LOW (ref 39.0–52.0)
Hemoglobin: 11.6 g/dL — ABNORMAL LOW (ref 13.0–17.0)
MCH: 25.5 pg — ABNORMAL LOW (ref 26.0–34.0)
MCHC: 31 g/dL (ref 30.0–36.0)
MCV: 82.2 fL (ref 78.0–100.0)
Platelets: 328 10*3/uL (ref 150–400)
RBC: 4.55 MIL/uL (ref 4.22–5.81)
RDW: 13.4 % (ref 11.5–15.5)
WBC: 5.3 10*3/uL (ref 4.0–10.5)

## 2017-04-13 LAB — BASIC METABOLIC PANEL
Anion gap: 7 (ref 5–15)
BUN: 16 mg/dL (ref 6–20)
CO2: 30 mmol/L (ref 22–32)
Calcium: 9.1 mg/dL (ref 8.9–10.3)
Chloride: 104 mmol/L (ref 101–111)
Creatinine, Ser: 0.9 mg/dL (ref 0.61–1.24)
GFR calc Af Amer: >60 mL/min (ref >60)
GFR calc non Af Amer: >60 mL/min (ref >60)
Glucose, Bld: 160 mg/dL — ABNORMAL HIGH (ref 65–99)
Potassium: 4 mmol/L (ref 3.5–5.1)
Sodium: 141 mmol/L (ref 135–145)

## 2017-04-13 LAB — I-STAT TROPONIN, ED: Troponin i, poc: 0 ng/mL (ref 0.00–0.08)

## 2017-04-13 MED ORDER — IPRATROPIUM-ALBUTEROL 0.5-2.5 (3) MG/3ML IN SOLN
3.0000 mL | Freq: Once | RESPIRATORY_TRACT | Status: AC
  Administered 2017-04-13: 3 mL via RESPIRATORY_TRACT
  Filled 2017-04-13: qty 3

## 2017-04-13 MED ORDER — HYDROMORPHONE HCL 1 MG/ML IJ SOLN
0.5000 mg | Freq: Once | INTRAMUSCULAR | Status: AC
  Administered 2017-04-13: 0.5 mg via INTRAVENOUS
  Filled 2017-04-13: qty 1

## 2017-04-13 MED ORDER — IOPAMIDOL (ISOVUE-370) INJECTION 76%
100.0000 mL | Freq: Once | INTRAVENOUS | Status: AC | PRN
  Administered 2017-04-13: 100 mL via INTRAVENOUS

## 2017-04-13 MED ORDER — KETOROLAC TROMETHAMINE 15 MG/ML IJ SOLN
15.0000 mg | Freq: Once | INTRAMUSCULAR | Status: AC
Start: 2017-04-13 — End: 2017-04-13
  Administered 2017-04-13: 15 mg via INTRAVENOUS
  Filled 2017-04-13: qty 1

## 2017-04-13 MED ORDER — IOPAMIDOL (ISOVUE-370) INJECTION 76%
INTRAVENOUS | Status: AC
  Filled 2017-04-13: qty 100

## 2017-04-13 NOTE — ED Notes (Signed)
Pt wants RN to check with ED Provider to ensure that there is no confusion regarding CT Angio since pt stated he had one done 1 week ago. I have attempted to locate ED Provider and inform him. Pt does not want RN inserting another line.

## 2017-04-13 NOTE — ED Notes (Signed)
Patient transported to CT 

## 2017-04-13 NOTE — ED Notes (Signed)
Pt is using restroom  

## 2017-04-13 NOTE — ED Triage Notes (Signed)
Patient c/o intermittent left/mid chest and SOB x 2 weeks. Patient also has a productive cough with Yellow/green/brown sputum x 2 weeks. Patient wears O2 4L/min via [place] at home,but had to wear only 2L/min via [place] while en route to the ED because he had a small O@ tank and his sats were 81% when he first arrived in the Ed.

## 2017-04-15 ENCOUNTER — Ambulatory Visit: Payer: Self-pay | Admitting: Emergency Medicine

## 2017-04-15 NOTE — ED Provider Notes (Addendum)
[facility] DEPT Provider Note   CSN: [id number] Arrival date & time: 04/13/17  1107     History   Chief Complaint Chief Complaint  Patient presents with  . Chest Pain  . Shortness of Breath    HPI Rayon Mcchristian is a 52 y.o. male.  HPI   51yM with dyspnea. Acute on chronic. Hx of sarcoidosis, COPD on home o2. Worsening dyspnea in past week. Increasing productive cough. Sharp R upper chest/shoulder pain with coughing. No fever or chills. No unusual leg pain or swelling.   Past Medical History:  Diagnosis Date  . Bronchitis   . CHF (congestive heart failure) ([place])   . Chronic respiratory failure ([place])    home O@ 4L (01/28/14)  . Chronic respiratory failure ([place])   . COPD (chronic obstructive pulmonary disease) ([place])   . GERD (gastroesophageal reflux disease)   . Hypertension   . Neuromuscular disorder ([place])   . OSA on CPAP    ramps 5 to 10  . Pneumonia   . Sarcoidosis     Patient Active Problem List   Diagnosis Date Noted  . Pneumonia of left lower lobe due to infectious organism ([place])   . Erythema nodosum, acute form 06/20/2016  . Cellulitis 06/20/2016  . Cellulitis of left anterior lower leg   . Acute respiratory failure with hypoxia ([place]) 04/16/2016  . Sarcoidosis 01/13/2016  . Hypertension 01/13/2016  . Chronic respiratory failure ([place]) 01/13/2016  . Cocaine abuse ([place]) 01/13/2016  . Respiratory distress 01/12/2016  . Epistaxis 09/19/2015  . Hyperkalemia   . COPD exacerbation ([place]) 07/21/2015  . HCAP (healthcare-associated pneumonia) 01/13/2015  . SOB (shortness of breath) 03/23/2014  . Essential hypertension   . Chronic respiratory failure with hypoxia ([place]) 02/11/2014  . Neuropathy 01/26/2014  . Abnormal bruising 01/23/2014  . Foot pain, bilateral 01/23/2014  . Discoloration of skin of toe 01/23/2014  . Acute on chronic respiratory failure with hypoxia ([place]) 01/17/2014  . Chronic pain 11/23/2013  . Abdominal pain,  epigastric 08/03/2012  . Nuclear sclerotic cataract 07/06/2012  . Normocytic anemia 06/16/2012  . GERD (gastroesophageal reflux disease) 06/16/2012  . Bronchiectasis with (acute) exacerbation ([place]) 03/05/2012  . Hyperglycemia 07/27/2011  . Error, refractive, myopia 02/15/2011  . OSA on CPAP 01/15/2011    Past Surgical History:  Procedure Laterality Date  . arm surgery     . ROTATOR CUFF REPAIR    . VIDEO ASSISTED THORACOSCOPY (VATS)/THOROCOTOMY Left 07/17/2012   Procedure: VIDEO ASSISTED THORACOSCOPY (VATS)/BLEB Stapling;  Surgeon: Gaye Pollack, MD;  Location: [facility] OR;  Service: Thoracic;  Laterality: Left;        Home Medications    Prior to Admission medications   Medication Sig Start Date End Date Taking? Authorizing Provider  albuterol (PROVENTIL HFA;VENTOLIN HFA) 108 (90 Base) MCG/ACT inhaler Inhale 2 puffs into the lungs every 6 (six) hours as needed for wheezing or shortness of breath. 04/19/16  Yes Robbie Lis, MD  albuterol (PROVENTIL) (2.5 MG/3ML) 0.083% nebulizer solution USE 1 VIAL IN NEBULIZER EVERY 6 HOURS AS NEEDED FOR WHEEZING 09/02/16  Yes [provider]  calcium-vitamin D (OSCAL WITH D) 500-200 MG-UNIT tablet Take 1 tablet by mouth daily.   Yes [provider]  CARTIA XT 120 MG 24 hr capsule Take 120 mg by mouth every morning. 04/16/16  Yes [provider]  chlorthalidone (HYGROTON) 25 MG tablet Take 25 mg by mouth daily. 06/17/16  Yes [provider]  cyclobenzaprine (FLEXERIL) 10 MG tablet  Take 20 mg by mouth 3 (three) times daily as needed for muscle spasms.    Yes [provider]  diphenhydrAMINE (BENADRYL) 25 MG tablet Take 25 mg by mouth every 6 (six) hours as needed for itching or allergies.   Yes [provider]  Ergocalciferol (VITAMIN D2) 400 units TABS Take 400 Units by mouth daily. 03/18/08  Yes [provider]  ferrous sulfate 325 (65 FE) MG tablet Take 325 mg by mouth 2 (two) times daily with a  meal.   Yes [provider]  ipratropium-albuterol (DUONEB) 0.5-2.5 (3) MG/3ML SOLN Take 3 mLs by nebulization every 4 (four) hours as needed. Patient taking differently: Take 3 mLs by nebulization every 4 (four) hours as needed (SOB, wheezing).  04/19/16  Yes Robbie Lis, MD  Multiple Vitamins-Minerals (MULTIVITAMIN WITH MINERALS) tablet Take 1 tablet by mouth every morning.    Yes [provider]  omeprazole (PRILOSEC) 40 MG capsule Take 40 mg by mouth daily.    Yes [provider]  ondansetron (ZOFRAN) 4 MG tablet Take 1 tablet (4 mg total) by mouth every 8 (eight) hours as needed for nausea or vomiting. 10/13/16  Yes Barton Dubois, MD  OXYGEN Inhale 4 L into the lungs continuous.   Yes [provider]  pregabalin (LYRICA) 200 MG capsule Take 400 mg by mouth daily.   Yes [provider]  saline (AYR) GEL Place 1 application into the nose every 4 (four) hours as needed (dryness/irritation).   Yes [provider]  SYMBICORT 160-4.5 MCG/ACT inhaler Inhale 1 puff into the lungs 2 (two) times daily. 04/19/16  Yes Robbie Lis, MD  predniSONE (DELTASONE) 10 MG tablet Take 68m for 3 days, then 378mfor 3 days, 2036mor 3 days, 94m48mr 3 days, then stop Patient not taking: Reported on 04/13/2017 02/03/17   ByruCollene Gobble  Respiratory Therapy Supplies (FLUTTER) DEVI As directed. 02/21/17   GrocMagdalen Spatz    Family History Family History  Problem Relation Age of Onset  . Diabetes Father     Social History Social History   Tobacco Use  . Smoking status: Former Smoker    Types: Cigarettes  . Smokeless tobacco: Never Used  Substance Use Topics  . Alcohol use: Yes    Comment: social 3-4 times weekly  . Drug use: Yes    Types: "Crack" cocaine, Cocaine    Comment: lst used 2 weeks ago.     Allergies   Clindamycin/lincomycin   Review of Systems Review of Systems  All systems reviewed and negative, other than as noted in  HPI.  Physical Exam Updated Vital Signs BP (!) 134/113 (BP Location: Left Arm)   Pulse (!) 110   Temp 98.8 F (37.1 C) (Oral)   Resp 16   Ht 5' 7.5" (1.715 m)   Wt 65.8 kg (145 lb)   SpO2 100%   BMI 22.38 kg/m   Physical Exam  Constitutional: He appears well-developed and well-nourished. No distress.  HENT:  Head: Normocephalic and atraumatic.  Eyes: Conjunctivae are normal. Right eye exhibits no discharge. Left eye exhibits no discharge.  Neck: Neck supple.  Cardiovascular: Normal rate, regular rhythm and normal heart sounds. Exam reveals no gallop and no friction rub.  No murmur heard. Pulmonary/Chest: No respiratory distress.  Mild tachypnea. Coarse breath sounds b/l  Abdominal: Soft. He exhibits no distension. There is no tenderness.  Musculoskeletal: He exhibits no edema or tenderness.  Neurological: He is alert.  Skin: Skin is warm and dry.  Psychiatric: He has a normal mood and affect. His behavior is normal. Thought content normal.  Nursing note and vitals reviewed.    ED Treatments / Results  Labs (all labs ordered are listed, but only abnormal results are displayed) Labs Reviewed  BASIC METABOLIC PANEL - Abnormal; Notable for the following components:      Result Value   Glucose, Bld 160 (*)    All other components within normal limits  CBC - Abnormal; Notable for the following components:   Hemoglobin 11.6 (*)    HCT 37.4 (*)    MCH 25.5 (*)    All other components within normal limits  I-STAT TROPONIN, ED    EKG EKG Interpretation  Date/Time:  Wednesday April 13 2017 11:18:11 EDT Ventricular Rate:  122 PR Interval:    QRS Duration: 79 QT Interval:  339 QTC Calculation: 483 R Axis:   123 Text Interpretation:  Sinus tachycardia Atrial premature complexes Borderline prolonged PR interval Consider right atrial enlargement Right axis deviation Abnormal T, consider ischemia, diffuse leads Confirmed by Virgel Manifold [phone]) on 04/13/2017 11:44:31 AM  Also confirmed by Virgel Manifold [phone]), editor Philomena Doheny [phone])  on 04/13/2017 12:33:38 PM   Radiology Dg Chest 2 View  Result Date: 04/13/2017 CLINICAL DATA:  Intermittent chest pain and shortness of breath. EXAM: CHEST - 2 VIEW COMPARISON:  03/10/2017 and multiple previous FINDINGS: Background pattern of advanced chronic fibrosis. Allowing for technical differences, the appearance is quite similar. No identifiable acute infiltrate, effusion or collapse. Certainly, acute inflammatory changes could be hidden within the extensive chronic disease. IMPRESSION: Advanced chronic lung disease with scarring. No acute finding, though acute inflammatory changes could be hidden within the extensive chronic densities. Electronically Signed   By: Nelson Chimes M.D.   On: 04/13/2017 11:49   Ct Angio Chest Pe W And/or Wo Contrast  Result Date: 04/13/2017 CLINICAL DATA:  High pretest probability of pulmonary embolism. EXAM: CT ANGIOGRAPHY CHEST WITH CONTRAST TECHNIQUE: Multidetector CT imaging of the chest was performed using the standard protocol during bolus administration of intravenous contrast. Multiplanar CT image reconstructions and MIPs were obtained to evaluate the vascular anatomy. CONTRAST:  127m ISOVUE-370 IOPAMIDOL (ISOVUE-370) INJECTION 76% COMPARISON:  10/11/2016 FINDINGS: Cardiovascular: Normal heart size. There is right ventricular enlargement relative to the left. The main pulmonary artery is dilated at 33 mm. No pulmonary embolism is seen. There is diffuse architectural distortion and blunting of pulmonary arteries related to fibrosis. Mediastinum/Nodes: Calcified mediastinal and hilar lymphadenopathy correlating with sarcoidosis history. Lungs/Pleura: Severe parenchymal disease with patchy bronchovascular and interstitial pattern reticular and nodular densities. Chronic cavity in the right mid lung measuring 8.5 cm. Fluid level seen previously is resolved. There is generalized severe airway  narrowing and poor luminal visualization. Superimposed paraseptal emphysema. Small right pleural effusion. No generalized interstitial opacity. No acute airspace opacity. Upper Abdomen: Negative Musculoskeletal: No acute or aggressive finding Review of the MIP images confirms the above findings. IMPRESSION: 1. Negative for pulmonary embolism. 2. Small right pleural effusion that is new from 6 months prior. No definite edema or pneumonia. 3. Sarcoidosis with advanced parenchymal and nodal involvement. 4. Chronic right upper lobe cavity measuring up to 8.5 cm. Fluid level seen previously is resolved. 5. Advanced generalized airway thickening, chronic. 6. Dilated right ventricle compared to the left. Question cor pulmonale. Electronically Signed   By: JMonte FantasiaM.D.   On: 04/13/2017 15:15    Procedures Procedures (including critical care time)  Medications Ordered in ED Medications  ipratropium-albuterol (DUONEB) 0.5-2.5 (3) MG/3ML nebulizer solution 3 mL (3 mLs Nebulization Given 04/13/17 1338)  ketorolac (TORADOL) 15 MG/ML injection 15 mg (15 mg Intravenous Given 04/13/17 1342)  HYDROmorphone (DILAUDID) injection 0.5 mg (0.5 mg Intravenous Given 04/13/17 1342)  iopamidol (ISOVUE-370) 76 % injection 100 mL ( Intravenous Canceled Entry 04/13/17 1523)     Initial Impression / Assessment and Plan / ED Course  I have reviewed the triage vital signs and the nursing notes.  Pertinent labs & imaging results that were available during my care of the patient were reviewed by me and considered in my medical decision making (see chart for details).     51yM with dyspnea. o2 sats good on baseline supplemental oxygen. CT w/o acute findings aside from small R effusion. No PE. I doubt AS or other emergent process.   Final Clinical Impressions(s) / ED Diagnoses   Final diagnoses:  Dyspnea, unspecified type  Chest pain, unspecified type    ED Discharge Orders    None       Virgel Manifold, MD 04/15/17  Larinda Buttery    Virgel Manifold, MD 04/15/17 [phone]

## 2017-04-18 ENCOUNTER — Other Ambulatory Visit: Payer: Self-pay

## 2017-04-18 ENCOUNTER — Emergency Department (HOSPITAL_COMMUNITY): Payer: 59

## 2017-04-18 ENCOUNTER — Observation Stay (HOSPITAL_BASED_OUTPATIENT_CLINIC_OR_DEPARTMENT_OTHER): Payer: 59

## 2017-04-18 ENCOUNTER — Encounter (HOSPITAL_COMMUNITY): Payer: Self-pay | Admitting: Emergency Medicine

## 2017-04-18 ENCOUNTER — Observation Stay (HOSPITAL_COMMUNITY)
Admission: EM | Admit: 2017-04-18 | Discharge: 2017-04-21 | Disposition: A | Discharge status: Home / Self Care | Payer: 59 | Attending: Internal Medicine | Admitting: Internal Medicine

## 2017-04-18 DIAGNOSIS — G629 Polyneuropathy, unspecified: Secondary | ICD-10-CM

## 2017-04-18 DIAGNOSIS — I110 Hypertensive heart disease with heart failure: Secondary | ICD-10-CM | POA: Insufficient documentation

## 2017-04-18 DIAGNOSIS — I5032 Chronic diastolic (congestive) heart failure: Secondary | ICD-10-CM | POA: Insufficient documentation

## 2017-04-18 DIAGNOSIS — R7989 Other specified abnormal findings of blood chemistry: Secondary | ICD-10-CM | POA: Diagnosis not present

## 2017-04-18 DIAGNOSIS — J441 Chronic obstructive pulmonary disease with (acute) exacerbation: Secondary | ICD-10-CM | POA: Diagnosis not present

## 2017-04-18 DIAGNOSIS — Z79899 Other long term (current) drug therapy: Secondary | ICD-10-CM | POA: Insufficient documentation

## 2017-04-18 DIAGNOSIS — D869 Sarcoidosis, unspecified: Secondary | ICD-10-CM | POA: Diagnosis not present

## 2017-04-18 DIAGNOSIS — R739 Hyperglycemia, unspecified: Secondary | ICD-10-CM | POA: Diagnosis not present

## 2017-04-18 DIAGNOSIS — Z9981 Dependence on supplemental oxygen: Secondary | ICD-10-CM | POA: Diagnosis not present

## 2017-04-18 DIAGNOSIS — Z9989 Dependence on other enabling machines and devices: Secondary | ICD-10-CM | POA: Insufficient documentation

## 2017-04-18 DIAGNOSIS — J471 Bronchiectasis with (acute) exacerbation: Secondary | ICD-10-CM | POA: Insufficient documentation

## 2017-04-18 DIAGNOSIS — J9600 Acute respiratory failure, unspecified whether with hypoxia or hypercapnia: Secondary | ICD-10-CM | POA: Diagnosis present

## 2017-04-18 DIAGNOSIS — R079 Chest pain, unspecified: Secondary | ICD-10-CM | POA: Diagnosis not present

## 2017-04-18 DIAGNOSIS — G4733 Obstructive sleep apnea (adult) (pediatric): Secondary | ICD-10-CM | POA: Insufficient documentation

## 2017-04-18 DIAGNOSIS — R0602 Shortness of breath: Secondary | ICD-10-CM | POA: Diagnosis present

## 2017-04-18 DIAGNOSIS — J9621 Acute and chronic respiratory failure with hypoxia: Secondary | ICD-10-CM | POA: Diagnosis not present

## 2017-04-18 DIAGNOSIS — I071 Rheumatic tricuspid insufficiency: Secondary | ICD-10-CM | POA: Insufficient documentation

## 2017-04-18 DIAGNOSIS — J9601 Acute respiratory failure with hypoxia: Secondary | ICD-10-CM | POA: Diagnosis not present

## 2017-04-18 DIAGNOSIS — F1410 Cocaine abuse, uncomplicated: Secondary | ICD-10-CM | POA: Diagnosis not present

## 2017-04-18 DIAGNOSIS — K219 Gastro-esophageal reflux disease without esophagitis: Secondary | ICD-10-CM | POA: Diagnosis not present

## 2017-04-18 DIAGNOSIS — J439 Emphysema, unspecified: Secondary | ICD-10-CM | POA: Insufficient documentation

## 2017-04-18 DIAGNOSIS — J9610 Chronic respiratory failure, unspecified whether with hypoxia or hypercapnia: Secondary | ICD-10-CM | POA: Diagnosis present

## 2017-04-18 DIAGNOSIS — I5082 Biventricular heart failure: Secondary | ICD-10-CM | POA: Insufficient documentation

## 2017-04-18 DIAGNOSIS — R000 Tachycardia, unspecified: Secondary | ICD-10-CM | POA: Insufficient documentation

## 2017-04-18 DIAGNOSIS — Z881 Allergy status to other antibiotic agents status: Secondary | ICD-10-CM | POA: Diagnosis not present

## 2017-04-18 DIAGNOSIS — I10 Essential (primary) hypertension: Secondary | ICD-10-CM | POA: Diagnosis present

## 2017-04-18 LAB — CBC WITH DIFFERENTIAL/PLATELET
BASOS ABS: 0 10*3/uL (ref 0.0–0.1)
BASOS PCT: 0 %
EOS PCT: 4 %
Eosinophils Absolute: 0.3 10*3/uL (ref 0.0–0.7)
HCT: 37.2 % — ABNORMAL LOW (ref 39.0–52.0)
Hemoglobin: 11.6 g/dL — ABNORMAL LOW (ref 13.0–17.0)
Lymphocytes Relative: 13 %
Lymphs Abs: 1.1 10*3/uL (ref 0.7–4.0)
MCH: 25.1 pg — ABNORMAL LOW (ref 26.0–34.0)
MCHC: 31.2 g/dL (ref 30.0–36.0)
MCV: 80.3 fL (ref 78.0–100.0)
MONO ABS: 0.2 10*3/uL (ref 0.1–1.0)
Monocytes Relative: 3 %
Neutro Abs: 7.1 10*3/uL (ref 1.7–7.7)
Neutrophils Relative %: 80 %
PLATELETS: 325 10*3/uL (ref 150–400)
RBC: 4.63 MIL/uL (ref 4.22–5.81)
RDW: 13.3 % (ref 11.5–15.5)
WBC: 8.8 10*3/uL (ref 4.0–10.5)

## 2017-04-18 LAB — BASIC METABOLIC PANEL
ANION GAP: 14 (ref 5–15)
BUN: 8 mg/dL (ref 6–20)
CALCIUM: 9.3 mg/dL (ref 8.9–10.3)
CO2: 26 mmol/L (ref 22–32)
Chloride: 98 mmol/L — ABNORMAL LOW (ref 101–111)
Creatinine, Ser: 0.81 mg/dL (ref 0.61–1.24)
Glucose, Bld: 88 mg/dL (ref 65–99)
POTASSIUM: 3.8 mmol/L (ref 3.5–5.1)
SODIUM: 138 mmol/L (ref 135–145)

## 2017-04-18 LAB — I-STAT TROPONIN, ED: TROPONIN I, POC: 0.02 ng/mL (ref 0.00–0.08)

## 2017-04-18 LAB — INFLUENZA PANEL BY PCR (TYPE A & B)
INFLAPCR: NEGATIVE
INFLBPCR: NEGATIVE

## 2017-04-18 LAB — ECHOCARDIOGRAM COMPLETE

## 2017-04-18 LAB — BRAIN NATRIURETIC PEPTIDE: B NATRIURETIC PEPTIDE 5: 495.6 pg/mL — AB (ref 0.0–100.0)

## 2017-04-18 LAB — RAPID URINE DRUG SCREEN, HOSP PERFORMED
Amphetamines: NOT DETECTED
BARBITURATES: NOT DETECTED
Benzodiazepines: NOT DETECTED
Cocaine: POSITIVE — AB
Opiates: POSITIVE — AB
TETRAHYDROCANNABINOL: NOT DETECTED

## 2017-04-18 MED ORDER — ACETAMINOPHEN 650 MG RE SUPP: 650.0000 mg | Freq: Four times a day (QID) | RECTAL | Status: DC | PRN

## 2017-04-18 MED ORDER — PREGABALIN 100 MG PO CAPS
200.0000 mg | ORAL_CAPSULE | Freq: Two times a day (BID) | ORAL | Status: DC
  Filled 2017-04-18: qty 2

## 2017-04-18 MED ORDER — ONDANSETRON HCL 4 MG PO TABS: 4.0000 mg | ORAL_TABLET | Freq: Four times a day (QID) | ORAL | Status: DC | PRN

## 2017-04-18 MED ORDER — HYDROCODONE-ACETAMINOPHEN 5-325 MG PO TABS
1.0000 | ORAL_TABLET | ORAL | Status: DC | PRN
  Administered 2017-04-18 – 2017-04-20 (×5): 2 via ORAL
  Filled 2017-04-18 (×5): qty 2

## 2017-04-18 MED ORDER — DIPHENHYDRAMINE HCL 25 MG PO CAPS
25.0000 mg | ORAL_CAPSULE | Freq: Four times a day (QID) | ORAL | Status: DC | PRN
  Administered 2017-04-18 – 2017-04-21 (×4): 25 mg via ORAL
  Filled 2017-04-18 (×4): qty 1

## 2017-04-18 MED ORDER — PREDNISONE 20 MG PO TABS
60.0000 mg | ORAL_TABLET | Freq: Once | ORAL | Status: AC
  Administered 2017-04-18: 60 mg via ORAL
  Filled 2017-04-18: qty 3

## 2017-04-18 MED ORDER — GUAIFENESIN ER 600 MG PO TB12
600.0000 mg | ORAL_TABLET | Freq: Two times a day (BID) | ORAL | Status: DC | PRN
  Administered 2017-04-18 – 2017-04-20 (×3): 600 mg via ORAL
  Filled 2017-04-18 (×3): qty 1

## 2017-04-18 MED ORDER — PREGABALIN 100 MG PO CAPS
200.0000 mg | ORAL_CAPSULE | Freq: Two times a day (BID) | ORAL | Status: DC
  Administered 2017-04-18 – 2017-04-21 (×6): 200 mg via ORAL
  Filled 2017-04-18 (×5): qty 2

## 2017-04-18 MED ORDER — ACETAMINOPHEN 325 MG PO TABS: 650.0000 mg | ORAL_TABLET | Freq: Four times a day (QID) | ORAL | Status: DC | PRN

## 2017-04-18 MED ORDER — ALBUTEROL SULFATE (2.5 MG/3ML) 0.083% IN NEBU
2.5000 mg | INHALATION_SOLUTION | RESPIRATORY_TRACT | Status: DC | PRN
  Administered 2017-04-18: 2.5 mg via RESPIRATORY_TRACT
  Filled 2017-04-18: qty 3

## 2017-04-18 MED ORDER — PREDNISONE 20 MG PO TABS: 10.0000 mg | ORAL_TABLET | Freq: Every day | ORAL | Status: DC

## 2017-04-18 MED ORDER — CHLORTHALIDONE 25 MG PO TABS
25.0000 mg | ORAL_TABLET | Freq: Every day | ORAL | Status: DC
  Administered 2017-04-18 – 2017-04-20 (×3): 25 mg via ORAL
  Filled 2017-04-18 (×3): qty 1

## 2017-04-18 MED ORDER — CALCIUM CARBONATE-VITAMIN D 500-200 MG-UNIT PO TABS
1.0000 | ORAL_TABLET | Freq: Every day | ORAL | Status: DC
  Administered 2017-04-18 – 2017-04-21 (×4): 1 via ORAL
  Filled 2017-04-18 (×4): qty 1

## 2017-04-18 MED ORDER — VITAMIN D2 10 MCG (400 UNIT) PO TABS: 400.0000 [IU] | ORAL_TABLET | Freq: Every day | ORAL | Status: DC

## 2017-04-18 MED ORDER — HEPARIN SODIUM (PORCINE) 5000 UNIT/ML IJ SOLN: 5000.0000 [IU] | Freq: Three times a day (TID) | INTRAMUSCULAR | Status: DC

## 2017-04-18 MED ORDER — BISACODYL 10 MG RE SUPP: 10.0000 mg | Freq: Every day | RECTAL | Status: DC | PRN

## 2017-04-18 MED ORDER — DOXYCYCLINE HYCLATE 100 MG PO TABS
100.0000 mg | ORAL_TABLET | Freq: Two times a day (BID) | ORAL | Status: DC
  Administered 2017-04-18 – 2017-04-21 (×7): 100 mg via ORAL
  Filled 2017-04-18 (×7): qty 1

## 2017-04-18 MED ORDER — IPRATROPIUM-ALBUTEROL 0.5-2.5 (3) MG/3ML IN SOLN
3.0000 mL | Freq: Four times a day (QID) | RESPIRATORY_TRACT | Status: DC
  Administered 2017-04-18: 3 mL via RESPIRATORY_TRACT
  Filled 2017-04-18: qty 3

## 2017-04-18 MED ORDER — MAGNESIUM SULFATE 2 GM/50ML IV SOLN
2.0000 g | Freq: Once | INTRAVENOUS | Status: AC
  Administered 2017-04-18: 2 g via INTRAVENOUS
  Filled 2017-04-18: qty 50

## 2017-04-18 MED ORDER — PREDNISONE 50 MG PO TABS
60.0000 mg | ORAL_TABLET | Freq: Every day | ORAL | Status: DC
  Administered 2017-04-19 – 2017-04-21 (×3): 60 mg via ORAL
  Filled 2017-04-18 (×3): qty 1

## 2017-04-18 MED ORDER — PANTOPRAZOLE SODIUM 40 MG PO TBEC
40.0000 mg | DELAYED_RELEASE_TABLET | Freq: Every day | ORAL | Status: DC
  Administered 2017-04-18 – 2017-04-21 (×4): 40 mg via ORAL
  Filled 2017-04-18 (×4): qty 1

## 2017-04-18 MED ORDER — LEVOFLOXACIN IN D5W 750 MG/150ML IV SOLN
750.0000 mg | Freq: Once | INTRAVENOUS | Status: AC
  Administered 2017-04-18: 750 mg via INTRAVENOUS
  Filled 2017-04-18: qty 150

## 2017-04-18 MED ORDER — PREGABALIN 25 MG PO CAPS
200.0000 mg | ORAL_CAPSULE | Freq: Two times a day (BID) | ORAL | Status: DC
  Administered 2017-04-18: 200 mg via ORAL
  Filled 2017-04-18: qty 8

## 2017-04-18 MED ORDER — DILTIAZEM HCL ER COATED BEADS 120 MG PO CP24
120.0000 mg | ORAL_CAPSULE | Freq: Every day | ORAL | Status: DC
  Administered 2017-04-18 – 2017-04-20 (×3): 120 mg via ORAL
  Filled 2017-04-18 (×3): qty 1

## 2017-04-18 MED ORDER — ENOXAPARIN SODIUM 30 MG/0.3ML ~~LOC~~ SOLN
30.0000 mg | SUBCUTANEOUS | Status: DC
  Filled 2017-04-18: qty 0.3

## 2017-04-18 MED ORDER — ENOXAPARIN SODIUM 40 MG/0.4ML ~~LOC~~ SOLN
40.0000 mg | SUBCUTANEOUS | Status: DC
  Administered 2017-04-19 – 2017-04-20 (×2): 40 mg via SUBCUTANEOUS
  Filled 2017-04-18 (×2): qty 0.4

## 2017-04-18 MED ORDER — MORPHINE SULFATE (PF) 4 MG/ML IV SOLN
2.0000 mg | Freq: Once | INTRAVENOUS | Status: AC
  Administered 2017-04-18: 2 mg via INTRAVENOUS
  Filled 2017-04-18: qty 1

## 2017-04-18 MED ORDER — SENNOSIDES-DOCUSATE SODIUM 8.6-50 MG PO TABS: 1.0000 | ORAL_TABLET | Freq: Every evening | ORAL | Status: DC | PRN

## 2017-04-18 MED ORDER — IPRATROPIUM-ALBUTEROL 0.5-2.5 (3) MG/3ML IN SOLN
9.0000 mL | Freq: Once | RESPIRATORY_TRACT | Status: AC
  Administered 2017-04-18: 9 mL via RESPIRATORY_TRACT

## 2017-04-18 MED ORDER — IPRATROPIUM-ALBUTEROL 0.5-2.5 (3) MG/3ML IN SOLN
3.0000 mL | RESPIRATORY_TRACT | Status: DC | PRN
  Filled 2017-04-18: qty 3

## 2017-04-18 MED ORDER — ONDANSETRON HCL 4 MG/2ML IJ SOLN
4.0000 mg | Freq: Once | INTRAMUSCULAR | Status: AC
  Administered 2017-04-18: 4 mg via INTRAVENOUS
  Filled 2017-04-18: qty 2

## 2017-04-18 MED ORDER — IPRATROPIUM-ALBUTEROL 0.5-2.5 (3) MG/3ML IN SOLN
3.0000 mL | Freq: Four times a day (QID) | RESPIRATORY_TRACT | Status: DC
  Administered 2017-04-19 – 2017-04-21 (×9): 3 mL via RESPIRATORY_TRACT
  Filled 2017-04-18 (×6): qty 3
  Filled 2017-04-18: qty 42
  Filled 2017-04-18 (×2): qty 3

## 2017-04-18 MED ORDER — ONDANSETRON HCL 4 MG/2ML IJ SOLN: 4.0000 mg | Freq: Four times a day (QID) | INTRAMUSCULAR | Status: DC | PRN

## 2017-04-18 MED ORDER — FERROUS SULFATE 325 (65 FE) MG PO TABS
325.0000 mg | ORAL_TABLET | Freq: Two times a day (BID) | ORAL | Status: DC
  Administered 2017-04-18: 325 mg via ORAL
  Filled 2017-04-18: qty 1

## 2017-04-18 NOTE — ED Triage Notes (Signed)
Pt arrives in respiratory distress from home with difficulty breathing and productive cough x 3 days. Pt has labored breathing on arrival with use of accessory muscles, diminished on left. On arrives finishing up 10mg  albuterol and 0.5 mg Atrovent. RT was called and started on 3 duo nebs as orders by MD. Pt able to speak in choppy sentences. Pt also has tightness in the center of his chest. Pt reports running out of many medications a week ago including his BP med's.

## 2017-04-18 NOTE — ED Notes (Signed)
Ordered dinner tray.  

## 2017-04-18 NOTE — ED Notes (Signed)
Pt sleepingf with steady respirations.

## 2017-04-18 NOTE — Progress Notes (Signed)
  Echocardiogram 2D Echocardiogram has been performed.  Roosvelt MaserLane, Aurore Redinger F 04/18/2017, 3:17 PM

## 2017-04-18 NOTE — ED Notes (Signed)
Echo at bedsdie  

## 2017-04-18 NOTE — ED Provider Notes (Signed)
[facility] 3E CHF Provider Note   CSN: [id number] Arrival date & time: 04/18/17  0921     History   Chief Complaint Chief Complaint  Patient presents with  . Shortness of Breath    HPI [name] is a 51 y.o. male.  51 yo M with a significant past medical history of sarcoidosis on 4 L of oxygen at all times comes in with a chief complaint of shortness of breath.  He has been having cough increased sputum and change in sputum over the past 3 or 4 days.  He was seen in the emergency department initially and had a CAT scan that was negative for pneumonia or pulmonary embolism.  He was then discharged home and has had worsening since then.  He denies overt fever but has had some feeling of subjective fevers and chills.  His lower extremity edema is at his baseline.  He has not had any of his medications for 3 or 4 days due to the cost of medication changing over the past month or 2.  The history is provided by the patient.  Shortness of Breath  This is a new problem. The average episode lasts 3 days. The problem occurs continuously.The current episode started more than 2 days ago. The problem has been gradually worsening. Associated symptoms include a fever (subjective), cough and sputum production. Pertinent negatives include no headaches, no chest pain, no vomiting, no abdominal pain and no rash. He has tried oral steroids and beta-agonist inhalers for the symptoms. The treatment provided mild relief. He has had prior hospitalizations. He has had prior ED visits. Associated medical issues include chronic lung disease.    Past Medical History:  Diagnosis Date  . Bronchitis   . CHF (congestive heart failure) (HCC)   . Chronic respiratory failure (HCC)    home O@ 4L (01/28/14)  . COPD (chronic obstructive pulmonary disease) (HCC)   . GERD (gastroesophageal reflux disease)   . Hypertension   . OSA on CPAP    ramps 5 to 10  . Pneumonia   . Sarcoidosis     Patient  Active Problem List   Diagnosis Date Noted  . Pneumonia of left lower lobe due to infectious organism (HCC)   . Erythema nodosum, acute form 06/20/2016  . Cellulitis 06/20/2016  . Acute respiratory failure with hypoxia (HCC) 04/16/2016  . Sarcoidosis 01/13/2016  . Hypertension 01/13/2016  . Chronic respiratory failure (HCC) 01/13/2016  . Cocaine abuse (HCC) 01/13/2016  . Respiratory distress 01/12/2016  . Epistaxis 09/19/2015  . Hyperkalemia   . COPD exacerbation (HCC) 07/21/2015  . HCAP (healthcare-associated pneumonia) 01/13/2015  . SOB (shortness of breath) 03/23/2014  . Essential hypertension   . Chronic respiratory failure with hypoxia (HCC) 02/11/2014  . Neuropathy 01/26/2014  . Abnormal bruising 01/23/2014  . Foot pain, bilateral 01/23/2014  . Discoloration of skin of toe 01/23/2014  . Acute on chronic respiratory failure with hypoxia (HCC) 01/17/2014  . Chronic pain 11/23/2013  . Abdominal pain, epigastric 08/03/2012  . Nuclear sclerotic cataract 07/06/2012  . Normocytic anemia 06/16/2012  . GERD (gastroesophageal reflux disease) 06/16/2012  . Bronchiectasis with (acute) exacerbation (HCC) 03/05/2012  . Hyperglycemia 07/27/2011  . Error, refractive, myopia 02/15/2011  . Sarcoid (HCC) 01/15/2011  . OSA on CPAP 01/15/2011    Past Surgical History:  Procedure Laterality Date  . arm surgery     . ROTATOR CUFF REPAIR    . VIDEO ASSISTED THORACOSCOPY (VATS)/THOROCOTOMY Left 07/17/2012   Procedure:   VIDEO ASSISTED THORACOSCOPY (VATS)/BLEB Stapling;  Surgeon: Gaye Pollack, MD;  Location: [facility] OR;  Service: Thoracic;  Laterality: Left;        Home Medications    Prior to Admission medications   Medication Sig Start Date End Date Taking? Authorizing Provider  albuterol (PROVENTIL HFA;VENTOLIN HFA) 108 (90 Base) MCG/ACT inhaler Inhale 2 puffs into the lungs every 6 (six) hours as needed for wheezing or shortness of breath. 04/19/16  Yes Robbie Lis, MD  albuterol  (PROVENTIL) (2.5 MG/3ML) 0.083% nebulizer solution USE 1 VIAL IN NEBULIZER EVERY 6 HOURS AS NEEDED FOR WHEEZING 09/02/16  Yes [provider]  calcium-vitamin D (OSCAL WITH D) 500-200 MG-UNIT tablet Take 1 tablet by mouth daily.   Yes [provider]  CARTIA XT 120 MG 24 hr capsule Take 120 mg by mouth every morning. 04/16/16  Yes [provider]  chlorthalidone (HYGROTON) 25 MG tablet Take 25 mg by mouth daily. 06/17/16  Yes [provider]  cyclobenzaprine (FLEXERIL) 10 MG tablet Take 10 mg by mouth 3 (three) times daily as needed for muscle spasms.    Yes [provider]  diphenhydrAMINE (BENADRYL) 25 MG tablet Take 25 mg by mouth every 6 (six) hours as needed for itching or allergies.   Yes [provider]  Ergocalciferol (VITAMIN D2) 400 units TABS Take 400 Units by mouth daily. 03/18/08  Yes [provider]  ferrous sulfate 325 (65 FE) MG tablet Take 325 mg by mouth 2 (two) times daily with a meal.   Yes [provider]  ipratropium-albuterol (DUONEB) 0.5-2.5 (3) MG/3ML SOLN Take 3 mLs by nebulization every 4 (four) hours as needed. Patient taking differently: Take 3 mLs by nebulization every 4 (four) hours as needed (SOB, wheezing).  04/19/16  Yes Robbie Lis, MD  Multiple Vitamins-Minerals (MULTIVITAMIN WITH MINERALS) tablet Take 1 tablet by mouth every morning.    Yes [provider]  omeprazole (PRILOSEC) 40 MG capsule Take 40 mg by mouth daily.    Yes [provider]  ondansetron (ZOFRAN) 4 MG tablet Take 1 tablet (4 mg total) by mouth every 8 (eight) hours as needed for nausea or vomiting. 10/13/16  Yes Barton Dubois, MD  OXYGEN Inhale 4 L into the lungs continuous.   Yes [provider]  pregabalin (LYRICA) 200 MG capsule Take 200 mg by mouth 2 (two) times daily.    Yes [provider]  saline (AYR) GEL Place 1 application into the nose every 4 (four) hours as needed (dryness/irritation).    Yes [provider]  SYMBICORT 160-4.5 MCG/ACT inhaler Inhale 1 puff into the lungs 2 (two) times daily. 04/19/16  Yes Robbie Lis, MD  Respiratory Therapy Supplies (FLUTTER) DEVI As directed. 02/21/17   Magdalen Spatz, NP    Family History Family History  Problem Relation Age of Onset  . Diabetes Father     Social History Social History   Tobacco Use  . Smoking status: Former Smoker    Types: Cigarettes  . Smokeless tobacco: Never Used  Substance Use Topics  . Alcohol use: Yes    Comment: social 3-4 times weekly  . Drug use: Yes    Types: "Crack" cocaine, Cocaine    Comment: lst used 2 weeks ago.     Allergies   Clindamycin/lincomycin   Review of Systems Review of Systems  Constitutional: Positive for fever (subjective). Negative for chills.  HENT: Negative for congestion and facial swelling.   Eyes: Negative  for discharge and visual disturbance.  Respiratory: Positive for cough, sputum production and shortness of breath.   Cardiovascular: Negative for chest pain and palpitations.  Gastrointestinal: Negative for abdominal pain, diarrhea and vomiting.  Musculoskeletal: Negative for arthralgias and myalgias.  Skin: Negative for color change and rash.  Neurological: Negative for tremors, syncope and headaches.  Psychiatric/Behavioral: Negative for confusion and dysphoric mood.     Physical Exam Updated Vital Signs BP 117/82 (BP Location: Right Arm)   Pulse 100   Temp 97.7 F (36.5 C) (Oral)   Resp 17   Ht 5' 7" (1.702 m)   Wt 63.8 kg (140 lb 9.6 oz)   SpO2 99%   BMI 22.02 kg/m   Physical Exam  Constitutional: He is oriented to person, place, and time. He appears well-developed and well-nourished.  HENT:  Head: Normocephalic and atraumatic.  Eyes: Pupils are equal, round, and reactive to light. EOM are normal.  Neck: Normal range of motion. Neck supple. No JVD present.  Cardiovascular: Normal rate and regular rhythm. Exam reveals no gallop and  no friction rub.  No murmur heard. Pulmonary/Chest: No respiratory distress. He has wheezes (all fields).  Abdominal: He exhibits no distension. There is no rebound and no guarding.  Musculoskeletal: Normal range of motion.  Neurological: He is alert and oriented to person, place, and time.  Skin: No rash noted. No pallor.  Psychiatric: He has a normal mood and affect. His behavior is normal.  Nursing note and vitals reviewed.    ED Treatments / Results  Labs (all labs ordered are listed, but only abnormal results are displayed) Labs Reviewed  CBC WITH DIFFERENTIAL/PLATELET - Abnormal; Notable for the following components:      Result Value   Hemoglobin 11.6 (*)    HCT 37.2 (*)    MCH 25.1 (*)    All other components within normal limits  BASIC METABOLIC PANEL - Abnormal; Notable for the following components:   Chloride 98 (*)    All other components within normal limits  BRAIN NATRIURETIC PEPTIDE - Abnormal; Notable for the following components:   B Natriuretic Peptide 495.6 (*)    All other components within normal limits  RAPID URINE DRUG SCREEN, HOSP PERFORMED - Abnormal; Notable for the following components:   Opiates POSITIVE (*)    Cocaine POSITIVE (*)    All other components within normal limits  INFLUENZA PANEL BY PCR (TYPE A & B)  BASIC METABOLIC PANEL  CBC  I-STAT TROPONIN, ED    EKG EKG Interpretation  Date/Time:  Monday April 18 2017 09:19:25 EDT Ventricular Rate:  114 PR Interval:    QRS Duration: 78 QT Interval:  340 QTC Calculation: 469 R Axis:   128 Text Interpretation:  Sinus tachycardia Multiple premature complexes, vent & supraven Probable left atrial enlargement Right axis deviation Abnormal T, consider ischemia, anterior leads No significant change since last tracing Confirmed by ,  ([id number]) on 04/18/2017 9:30:01 AM   Radiology Dg Chest Port 1 View  Result Date: 04/18/2017 CLINICAL DATA:  Cough with respiratory distress. History of  sarcoidosis EXAM: PORTABLE CHEST 1 VIEW COMPARISON:  April 13, 2017 chest radiograph and chest CT FINDINGS: There is extensive underlying fibrosis and bullous disease. There are areas of patchy consolidation in both mid lung and left base regions, stable and felt to be due to chronic scarring superimposed on fibrosis. No new opacity evident. Heart size is normal. Pulmonary vascularity reflects underlying emphysematous change. No adenopathy is appreciable by radiography. IMPRESSION:   Extensive chronic scarring and fibrosis consistent with history of sarcoidosis. No new airspace opacity. No adenopathy evident by radiography. Stable cardiac silhouette. Underlying emphysematous change. Emphysema (ICD10-J43.9). Electronically Signed   By: Lowella Grip III M.D.   On: 04/18/2017 10:06    Procedures Procedures (including critical care time)  Medications Ordered in ED Medications  diltiazem (CARDIZEM CD) 24 hr capsule 120 mg (120 mg Oral Given 04/18/17 1559)  chlorthalidone (HYGROTON) tablet 25 mg (25 mg Oral Given 04/18/17 1601)  calcium-vitamin D (OSCAL WITH D) 500-200 MG-UNIT per tablet 1 tablet (1 tablet Oral Given 04/18/17 1600)  pantoprazole (PROTONIX) EC tablet 40 mg (40 mg Oral Given 04/18/17 1600)  acetaminophen (TYLENOL) tablet 650 mg (has no administration in time range)    Or  acetaminophen (TYLENOL) suppository 650 mg (has no administration in time range)  HYDROcodone-acetaminophen (NORCO/VICODIN) 5-325 MG per tablet 1-2 tablet (2 tablets Oral Given 04/18/17 1808)  senna-docusate (Senokot-S) tablet 1 tablet (has no administration in time range)  bisacodyl (DULCOLAX) suppository 10 mg (has no administration in time range)  ondansetron (ZOFRAN) tablet 4 mg (has no administration in time range)    Or  ondansetron (ZOFRAN) injection 4 mg (has no administration in time range)  doxycycline (VIBRA-TABS) tablet 100 mg (100 mg Oral Given 04/18/17 2135)  albuterol (PROVENTIL) (2.5 MG/3ML) 0.083% nebulizer  solution 2.5 mg (2.5 mg Nebulization Given 04/18/17 1831)  guaiFENesin (MUCINEX) 12 hr tablet 600 mg (600 mg Oral Given 04/18/17 2135)  predniSONE (DELTASONE) tablet 60 mg (has no administration in time range)  enoxaparin (LOVENOX) injection 40 mg (has no administration in time range)  diphenhydrAMINE (BENADRYL) capsule 25 mg (25 mg Oral Given 04/18/17 2135)  ipratropium-albuterol (DUONEB) 0.5-2.5 (3) MG/3ML nebulizer solution 3 mL (has no administration in time range)  pregabalin (LYRICA) capsule 200 mg (200 mg Oral Given 04/18/17 2150)  ipratropium-albuterol (DUONEB) 0.5-2.5 (3) MG/3ML nebulizer solution 9 mL (9 mLs Nebulization Given 04/18/17 0923)  predniSONE (DELTASONE) tablet 60 mg (60 mg Oral Given 04/18/17 1011)  magnesium sulfate IVPB 2 g 50 mL (0 g Intravenous Stopped 04/18/17 1032)  morphine 4 MG/ML injection 2 mg (2 mg Intravenous Given 04/18/17 1032)  ondansetron (ZOFRAN) injection 4 mg (4 mg Intravenous Given 04/18/17 1032)  levofloxacin (LEVAQUIN) IVPB 750 mg (0 mg Intravenous Stopped 04/18/17 1438)     Initial Impression / Assessment and Plan / ED Course  I have reviewed the triage vital signs and the nursing notes.  Pertinent labs & imaging results that were available during my care of the patient were reviewed by me and considered in my medical decision making (see chart for details).     52 yo M with a chief complaint of cough congestion and shortness of breath for the past 3 or 4 days.  The patient is on chronic 4 L of oxygen due to sarcoidosis.  My exam consistent with a COPD exacerbation.  Will start on bronchodilators chest x-ray reassess.  CXR with chronic lung disease.    Mild improvement with breathing treatments.    The patients results and plan were reviewed and discussed.   Any x-rays performed were independently reviewed by myself.   Differential diagnosis were considered with the presenting HPI.  Medications  diltiazem (CARDIZEM CD) 24 hr capsule 120 mg (120 mg Oral  Given 04/18/17 1559)  chlorthalidone (HYGROTON) tablet 25 mg (25 mg Oral Given 04/18/17 1601)  calcium-vitamin D (OSCAL WITH D) 500-200 MG-UNIT per tablet 1 tablet (1 tablet Oral Given 04/18/17 1600)  pantoprazole (PROTONIX) EC tablet 40 mg (40 mg Oral Given 04/18/17 1600)  acetaminophen (TYLENOL) tablet 650 mg (has no administration in time range)    Or  acetaminophen (TYLENOL) suppository 650 mg (has no administration in time range)  HYDROcodone-acetaminophen (NORCO/VICODIN) 5-325 MG per tablet 1-2 tablet (2 tablets Oral Given 04/18/17 1808)  senna-docusate (Senokot-S) tablet 1 tablet (has no administration in time range)  bisacodyl (DULCOLAX) suppository 10 mg (has no administration in time range)  ondansetron (ZOFRAN) tablet 4 mg (has no administration in time range)    Or  ondansetron (ZOFRAN) injection 4 mg (has no administration in time range)  doxycycline (VIBRA-TABS) tablet 100 mg (100 mg Oral Given 04/18/17 2135)  albuterol (PROVENTIL) (2.5 MG/3ML) 0.083% nebulizer solution 2.5 mg (2.5 mg Nebulization Given 04/18/17 1831)  guaiFENesin (MUCINEX) 12 hr tablet 600 mg (600 mg Oral Given 04/18/17 2135)  predniSONE (DELTASONE) tablet 60 mg (has no administration in time range)  enoxaparin (LOVENOX) injection 40 mg (has no administration in time range)  diphenhydrAMINE (BENADRYL) capsule 25 mg (25 mg Oral Given 04/18/17 2135)  ipratropium-albuterol (DUONEB) 0.5-2.5 (3) MG/3ML nebulizer solution 3 mL (has no administration in time range)  pregabalin (LYRICA) capsule 200 mg (200 mg Oral Given 04/18/17 2150)  ipratropium-albuterol (DUONEB) 0.5-2.5 (3) MG/3ML nebulizer solution 9 mL (9 mLs Nebulization Given 04/18/17 0923)  predniSONE (DELTASONE) tablet 60 mg (60 mg Oral Given 04/18/17 1011)  magnesium sulfate IVPB 2 g 50 mL (0 g Intravenous Stopped 04/18/17 1032)  morphine 4 MG/ML injection 2 mg (2 mg Intravenous Given 04/18/17 1032)  ondansetron (ZOFRAN) injection 4 mg (4 mg Intravenous Given 04/18/17 1032)    levofloxacin (LEVAQUIN) IVPB 750 mg (0 mg Intravenous Stopped 04/18/17 1438)    Vitals:   04/18/17 2011 04/18/17 2100 04/19/17 0023 04/19/17 0534  BP: (!) 131/99  (!) 113/97 117/82  Pulse: (!) 105  90 100  Resp: _0 Temp: 97.8 F (36.6 C)  (!) 97.5 F (36.4 C) 97.7 F (36.5 C)  TempSrc: Oral  Oral Oral  SpO2: 100% 97% 99% 99%  Weight:    63.8 kg (140 lb 9.6 oz)  Height:        Final diagnoses:  COPD exacerbation ([facility])    Admission/ observation were discussed with the admitting physician, patient and/or family and they are comfortable with the plan.    Final Clinical Impressions(s) / ED Diagnoses   Final diagnoses:  COPD exacerbation [facility])    ED Discharge Orders    None       Deno Etienne, DO 04/19/17 [phone]

## 2017-04-18 NOTE — ED Notes (Signed)
Pt eating supper and tolerating well. Pt called for second tray.

## 2017-04-18 NOTE — ED Notes (Signed)
ED Provider at bedside. 

## 2017-04-18 NOTE — Care Management Note (Addendum)
Case Management Note  Patient Details  Name: [name] MRN: [id number] Date of Birth: 09/29/1965  Subjective/Objective:                  52 y.o. male with a history of sarcoidosis on continuous 4 L oxygen presenting with increasing shortness of breath, cough, will increase yellow sputum, especially over the last 3 or 4 days.  From home alone.  Action/Plan: Admit status OBSERVATION (SOB); anticipate discharge HOME WITH SELF CARE.   Expected Discharge Date:  (unknown)               Expected Discharge Plan:  Home/Self Care  In-House Referral:     Discharge planning Services  CM Consult  Status of Service:  In process, will continue to follow  If discussed at Long Length of Stay Meetings, dates discussed:    Additional Comments: CM consulted for medication assistance.  Pt has insurance coverage and [facility].    Oletta CohnWood, Shaine Mount, RN 04/18/2017, 2:06 PM

## 2017-04-18 NOTE — H&P (Addendum)
History and Physical    [name] PYK:[id number] DOB: [birth year] DOA: 04/18/2017   PCP: Health, [facility]   Patient coming from:  Home    Chief Complaint: Shortness of breath   HPI: [name] is a 52 y.o. male with a history of sarcoidosis on continuous 4 L oxygen , COPD, OSA on CPAP,  Sarcoidosis, prior cocaine abuse, CHF 1, GERD, presenting with increasing shortness of breath, cough, will increase yellow sputum, especially over the last 3 or 4 days.  Of note, he had been seen in the emergency department on 04/13/2017, at which time he reported similar symptoms, having had a CT of the chest, negative for pneumonia or PE.  He was discharged home, but since then the symptoms have been worsening.  He has subjective fevers, and chills.  He denies any sick contacts.  He has had a flu vaccine.  The patient has not been able to be compliant with his respiratory medicines, or any other of his home meds, due to the cost of these wth last dose about 1 week ago. He denies any cardiac complaints.  He does report chest wall pain, and costal pain with deep inspiration,bilateral, especially with coughing, although the symptoms were present during his prior visit to the ED.  He denies any palpitations.  He denies any vomiting, diarrhea.  Denies any calf pain, or lower extremity edema he denies any headaches, and no confusion is reported.  The patient is due to see his pulmonary physician, Dr. Lamonte Sakai this week.  ED Course:  BP (!) 152/114   Pulse (!) 128   Resp 19   SpO2 97%   On admission, he was at 9 L of oxygen requirement, currently at 5 L. Received DuoNeb x2, and also was given Deltasone 60 mg with some ease of his wheezing. Sinus tachycardia with multiple diabetes, no significant change since last tracing on 04/13/2017 He also was given Levaquin x1.  Review of Systems:  As per HPI otherwise all other systems reviewed and are negative  Past Medical History:  Diagnosis Date  . Bronchitis     . CHF (congestive heart failure) ([place])   . Chronic respiratory failure ([place])    home O@ 4L (01/28/14)  . Chronic respiratory failure ([place])   . COPD (chronic obstructive pulmonary disease) ([place])   . GERD (gastroesophageal reflux disease)   . Hypertension   . Neuromuscular disorder ([place])   . OSA on CPAP    ramps 5 to 10  . Pneumonia   . Sarcoidosis     Past Surgical History:  Procedure Laterality Date  . arm surgery     . ROTATOR CUFF REPAIR    . VIDEO ASSISTED THORACOSCOPY (VATS)/THOROCOTOMY Left 07/17/2012   Procedure: VIDEO ASSISTED THORACOSCOPY (VATS)/BLEB [place];  Surgeon: Gaye Pollack, MD;  Location: [facility] OR;  Service: Thoracic;  Laterality: Left;    Social History Social History   Socioeconomic History  . Marital status: Divorced    Spouse name: Not on file  . Number of children: 2  . Years of education: Not on file  . Highest education level: Not on file  Occupational History  . Occupation: Disabled.    Employer: RETIRED  Social Needs  . Financial resource strain: Not on file  . Food insecurity:    Worry: Not on file    Inability: Not on file  . Transportation needs:    Medical: Not on file    Non-medical: Not on file  Tobacco Use  .  Smoking status: Former Smoker    Types: Cigarettes  . Smokeless tobacco: Never Used  Substance and Sexual Activity  . Alcohol use: Yes    Comment: social 3-4 times weekly  . Drug use: Yes    Types: "Crack" cocaine, Cocaine    Comment: lst used 2 weeks ago.  Marland Kitchen Sexual activity: Never  Lifestyle  . Physical activity:    Days per week: Not on file    Minutes per session: Not on file  . Stress: Not on file  Relationships  . Social connections:    Talks on phone: Not on file    Gets together: Not on file    Attends religious service: Not on file    Active member of club or organization: Not on file    Attends meetings of clubs or organizations: Not on file    Relationship status: Not on file  . Intimate partner violence:     Fear of current or ex partner: Not on file    Emotionally abused: Not on file    Physically abused: Not on file    Forced sexual activity: Not on file  Other Topics Concern  . Not on file  Social History Narrative   ** Merged History Encounter **       Divorced.  Lives alone.     Allergies  Allergen Reactions  . Clindamycin/Lincomycin Anaphylaxis and Swelling    Made face and tongue swell    Family History  Problem Relation Age of Onset  . Diabetes Father       Prior to Admission medications   Medication Sig Start Date End Date Taking? Authorizing Provider  albuterol (PROVENTIL HFA;VENTOLIN HFA) 108 (90 Base) MCG/ACT inhaler Inhale 2 puffs into the lungs every 6 (six) hours as needed for wheezing or shortness of breath. 04/19/16  Yes Robbie Lis, MD  albuterol (PROVENTIL) (2.5 MG/3ML) 0.083% nebulizer solution USE 1 VIAL IN NEBULIZER EVERY 6 HOURS AS NEEDED FOR WHEEZING 09/02/16  Yes [provider]  calcium-vitamin D (OSCAL WITH D) 500-200 MG-UNIT tablet Take 1 tablet by mouth daily.   Yes [provider]  CARTIA XT 120 MG 24 hr capsule Take 120 mg by mouth every morning. 04/16/16  Yes [provider]  chlorthalidone (HYGROTON) 25 MG tablet Take 25 mg by mouth daily. 06/17/16  Yes [provider]  cyclobenzaprine (FLEXERIL) 10 MG tablet Take 10 mg by mouth 3 (three) times daily as needed for muscle spasms.    Yes [provider]  diphenhydrAMINE (BENADRYL) 25 MG tablet Take 25 mg by mouth every 6 (six) hours as needed for itching or allergies.   Yes [provider]  Ergocalciferol (VITAMIN D2) 400 units TABS Take 400 Units by mouth daily. 03/18/08  Yes [provider]  ferrous sulfate 325 (65 FE) MG tablet Take 325 mg by mouth 2 (two) times daily with a meal.   Yes [provider]  ipratropium-albuterol (DUONEB) 0.5-2.5 (3) MG/3ML SOLN Take 3 mLs by nebulization every 4 (four) hours as needed. Patient taking  differently: Take 3 mLs by nebulization every 4 (four) hours as needed (SOB, wheezing).  04/19/16  Yes Robbie Lis, MD  Multiple Vitamins-Minerals (MULTIVITAMIN WITH MINERALS) tablet Take 1 tablet by mouth every morning.    Yes [provider]  omeprazole (PRILOSEC) 40 MG capsule Take 40 mg by mouth daily.    Yes [provider]  ondansetron (ZOFRAN) 4 MG tablet Take 1 tablet (4 mg total) by  mouth every 8 (eight) hours as needed for nausea or vomiting. 10/13/16  Yes Barton Dubois, MD  OXYGEN Inhale 4 L into the lungs continuous.   Yes [provider]  pregabalin (LYRICA) 200 MG capsule Take 200 mg by mouth 2 (two) times daily.    Yes [provider]  saline (AYR) GEL Place 1 application into the nose every 4 (four) hours as needed (dryness/irritation).   Yes [provider]  SYMBICORT 160-4.5 MCG/ACT inhaler Inhale 1 puff into the lungs 2 (two) times daily. 04/19/16  Yes Robbie Lis, MD  Respiratory Therapy Supplies (FLUTTER) DEVI As directed. 02/21/17   Magdalen Spatz, NP    Physical Exam:  Vitals:   04/18/17 1100 04/18/17 1200 04/18/17 1306 04/18/17 1400  BP: (!) 151/91 (!) 141/113 (!) 137/115 (!) 152/114  Pulse: (!) 117 (!) 115 (!) 116 (!) 128  Resp: (!) _0 SpO2: 98% 98% 96% 97%   Constitutional: NAD, but ill appearing, somewhat diaphoretic Eyes: PERRL, lids and conjunctivae normal ENMT: Mucous membranes are moist, without exudate or lesions  Neck: normal, supple, no masses, no thyromegaly Respiratory: Essentially clear to auscultation bilaterally, bilateral expiratory wheezing, no crackles. Normal respiratory effort  Cardiovascular: K rate and rhythm,  murmur, rubs or gallops. No extremity edema. 2+ pedal pulses. No carotid bruits.  Abdomen: Soft, non tender, No hepatosplenomegaly. Bowel sounds positive.  Musculoskeletal: no clubbing / cyanosis. Moves all extremities Skin: no jaundice, No lesions.  Neurologic: Sensation intact   Strength equal in all extremities Psychiatric:   Alert and oriented x 3 anxious mood.     Labs on Admission: I have personally reviewed following labs and imaging studies  CBC: Recent Labs  Lab 04/13/17 1202 04/18/17 1020  WBC 5.3 8.8  NEUTROABS  --  7.1  HGB 11.6* 11.6*  HCT 37.4* 37.2*  MCV 82.2 80.3  PLT 328 659    Basic Metabolic Panel: Recent Labs  Lab 04/13/17 1202 04/18/17 1020  NA 141 138  K 4.0 3.8  CL 104 98*  CO2 30 26  GLUCOSE 160* 88  BUN 16 8  CREATININE 0.90 0.81  CALCIUM 9.1 9.3    GFR: Estimated Creatinine Clearance: 99.3 mL/min (by C-G formula based on SCr of 0.81 mg/dL).  Liver Function Tests: No results for input(s): AST, ALT, ALKPHOS, BILITOT, PROT, ALBUMIN in the last 168 hours. No results for input(s): LIPASE, AMYLASE in the last 168 hours. No results for input(s): AMMONIA in the last 168 hours.  Coagulation Profile: No results for input(s): INR, PROTIME in the last 168 hours.  Cardiac Enzymes: No results for input(s): CKTOTAL, CKMB, CKMBINDEX, TROPONINI in the last 168 hours.  BNP (last 3 results) No results for input(s): PROBNP in the last 8760 hours.  HbA1C: No results for input(s): HGBA1C in the last 72 hours.  CBG: No results for input(s): GLUCAP in the last 168 hours.  Lipid Profile: No results for input(s): CHOL, HDL, LDLCALC, TRIG, CHOLHDL, LDLDIRECT in the last 72 hours.  Thyroid Function Tests: No results for input(s): TSH, T4TOTAL, FREET4, T3FREE, THYROIDAB in the last 72 hours.  Anemia Panel: No results for input(s): VITAMINB12, FOLATE, FERRITIN, TIBC, IRON, RETICCTPCT in the last 72 hours.  Urine analysis:    Component Value Date/Time   COLORURINE YELLOW 01/09/2014 1843   APPEARANCEUR CLEAR 01/09/2014 1843   LABSPEC 1.023 01/09/2014 1843   PHURINE 5.5 01/09/2014 1843   GLUCOSEU 250 (A) 01/09/2014 [place] NEGATIVE 01/09/2014 1843  BILIRUBINUR NEGATIVE 01/09/2014 [place] 01/09/2014  1843   PROTEINUR NEGATIVE 01/09/2014 1843   UROBILINOGEN 0.2 01/09/2014 1843   NITRITE NEGATIVE 01/09/2014 1843   LEUKOCYTESUR NEGATIVE 01/09/2014 1843    Sepsis Labs: _0 (procalcitonin:4,lacticidven:4) )No results found for this or any previous visit (from the past 240 hour(s)).   Radiological Exams on Admission: Dg Chest Port 1 View  Result Date: 04/18/2017 CLINICAL DATA:  Cough with respiratory distress. History of sarcoidosis EXAM: PORTABLE CHEST 1 VIEW COMPARISON:  April 13, 2017 chest radiograph and chest CT FINDINGS: There is extensive underlying fibrosis and bullous disease. There are areas of patchy consolidation in both mid lung and left base regions, stable and felt to be due to chronic scarring superimposed on fibrosis. No new opacity evident. Heart size is normal. Pulmonary vascularity reflects underlying emphysematous change. No adenopathy is appreciable by radiography. IMPRESSION: Extensive chronic scarring and fibrosis consistent with history of sarcoidosis. No new airspace opacity. No adenopathy evident by radiography. Stable cardiac silhouette. Underlying emphysematous change. Emphysema (ICD10-J43.9). Electronically Signed   By: Lowella Grip III M.D.   On: 04/18/2017 10:06    EKG: Independently reviewed.  Assessment/Plan Principal Problem:   Acute respiratory failure with hypoxia (HCC) Active Problems:   COPD exacerbation (HCC)   Sarcoid (HCC)   OSA on CPAP   Hyperglycemia   GERD (gastroesophageal reflux disease)   Neuropathy   Essential hypertension   Chronic respiratory failure (HCC)   Cocaine abuse ([place])    Acute hypoxic respiratory failure likely secondary to acute COPD exacerbation .  Chest x-ray negative for acute findings, except extensive chronic scarring and fibrosis due to sarcoidosis.  There is no new airspace opacity, adenopathy. Recent CT chest neg for PE or PNA. count normal, afebrile,  WBC  Afebrile bicarb 26   patient received duo nebs  continuous nebulizer x1, magnesium sulfate 2 g, and prednisone 60 mg x1.  He also received Levaquin x1 currently on O2 at 5 L.  Fluids a panel pending  Admit to Medsurg Doxycycline starting in a.m. Duonebs and Albuterol  Steroids with Prednisone 60 mg qd x 5 days s Protonix   Mucinex O2 prn CBC in am Incentive spirometry Case manager consult, to prevent further admissions for same Follow with Dr. Lamonte Sakai, pulmonary, as OP   Sarcoidosis, followed by Pulmonary as OP. Recent CT chest 04/08/2017 remarkable for  large cavity of the right upper lobe measuring 8.1 x 6.9 cm, with progressive fibrosis. Small right pleural effusion, new from prior. These findings may account for areas of pain in chest wall, shortness of breath as above mentioned. CT A?P And osseous studies essentially  negative  Plan as above Follow with Dr. Lamonte Sakai as OP tis week, upon discharge     OSA Continue CPAP nightly  Elevated BNP in the setting of Acute respiratory failure, O2 demand. BNP 495.6, Tn neg, EKG without acute changes, CXR without cardiomegaly. Last 2 D echo 2458 with nl Systolic, EF 09-98 and grade 1 diastolic Check 2 D echo r/o CHF role in his symptoms Repeat EKG in am    Hypertension BP  137/115  Pulse116, patient ran out of medications for at least 1-1/2 weeks, which may have resulted in these abnormal blood pressure readings. Resume  home  [place], avoid BB due to h/o cocaine   Anemia, chronic, no acute bleeding issues, current Hb 11.6 Continue to monitor, repeat CBC in am      GERD, no acute symptoms Continue PPI  Peripheral  neuropathy Continue Lyrica  History of Cocaine, last 2 weeks ago Check UDS   DVT prophylaxis:  Lovenox Code Status:    Full code  Family Communication:  Discussed with patient Disposition Plan: Expect patient to be discharged to home after condition improves Consults called:    Care management Admission status: [place]    Sharene Butters, PA-C [facility]     Amion text  [phone]   04/18/2017, 2:27 PM

## 2017-04-19 DIAGNOSIS — I10 Essential (primary) hypertension: Secondary | ICD-10-CM

## 2017-04-19 DIAGNOSIS — J9601 Acute respiratory failure with hypoxia: Secondary | ICD-10-CM | POA: Diagnosis not present

## 2017-04-19 DIAGNOSIS — F1410 Cocaine abuse, uncomplicated: Secondary | ICD-10-CM | POA: Diagnosis not present

## 2017-04-19 DIAGNOSIS — J9311 Primary spontaneous pneumothorax: Secondary | ICD-10-CM | POA: Diagnosis not present

## 2017-04-19 DIAGNOSIS — J9611 Chronic respiratory failure with hypoxia: Secondary | ICD-10-CM | POA: Diagnosis not present

## 2017-04-19 DIAGNOSIS — D869 Sarcoidosis, unspecified: Secondary | ICD-10-CM | POA: Diagnosis not present

## 2017-04-19 DIAGNOSIS — J441 Chronic obstructive pulmonary disease with (acute) exacerbation: Secondary | ICD-10-CM | POA: Diagnosis not present

## 2017-04-19 LAB — BASIC METABOLIC PANEL
Anion gap: 11 (ref 5–15)
BUN: 11 mg/dL (ref 6–20)
CALCIUM: 9.4 mg/dL (ref 8.9–10.3)
CO2: 27 mmol/L (ref 22–32)
CREATININE: 0.9 mg/dL (ref 0.61–1.24)
Chloride: 98 mmol/L — ABNORMAL LOW (ref 101–111)
GFR calc Af Amer: >60 mL/min (ref >60)
Glucose, Bld: 191 mg/dL — ABNORMAL HIGH (ref 65–99)
Potassium: 4.4 mmol/L (ref 3.5–5.1)
SODIUM: 136 mmol/L (ref 135–145)

## 2017-04-19 LAB — CBC
HCT: 36.5 % — ABNORMAL LOW (ref 39.0–52.0)
Hemoglobin: 11.2 g/dL — ABNORMAL LOW (ref 13.0–17.0)
MCH: 25 pg — ABNORMAL LOW (ref 26.0–34.0)
MCHC: 30.7 g/dL (ref 30.0–36.0)
MCV: 81.5 fL (ref 78.0–100.0)
PLATELETS: 352 10*3/uL (ref 150–400)
RBC: 4.48 MIL/uL (ref 4.22–5.81)
RDW: 13.6 % (ref 11.5–15.5)
WBC: 7.7 10*3/uL (ref 4.0–10.5)

## 2017-04-19 MED ORDER — FUROSEMIDE 10 MG/ML IJ SOLN
20.0000 mg | Freq: Once | INTRAMUSCULAR | Status: AC
  Administered 2017-04-19: 20 mg via INTRAVENOUS
  Filled 2017-04-19: qty 2

## 2017-04-19 NOTE — Progress Notes (Signed)
PROGRESS NOTE    [name]  ZOX:[id number] DOB: 14-Jul-1965 DOA: 04/18/2017 PCP: Health, [facility]   Outpatient Specialists:     Brief Narrative:  [name] is a 52 y.o. male with a Past Medical History of sarcoidosis; OSA on CPAP; HTN; COPD on 4L home O2; and CHF (grade 1 diastolic dysfunction in 12/15) who presents with SOB and productive cough x 1 week.  He was seen by the ER on 4/3 and diagnosed with dyspnea and chest pain but does not appear to have been given outpatient prescriptions.  At the time of my evaluation, the patient reported some improvement.  He was given Levaquin and Prednisone in the ER.  He had scattered wheezing but reasonably good air movement on home O2.     Assessment & Plan:   Principal Problem:   Acute respiratory failure with hypoxia (HCC) Active Problems:   Sarcoid (HCC)   OSA on CPAP   Hyperglycemia   GERD (gastroesophageal reflux disease)   Neuropathy   Essential hypertension   COPD exacerbation (HCC)   Chronic respiratory failure (HCC)   Cocaine abuse (HCC)   Acute hypoxic respiratory failure on chronic likely secondary to cocaine use (per chart review, usually has issues after smoking cocaine) Doxycycline/prednisone Duonebs and Albuterol Protonix   Mucinex O2- wears 4L at home Follow with Dr. Delton Coombes, pulmonary, as OP   Sarcoidosis, followed by Pulmonary Follow with Dr. Delton Coombes as OP    Elevated BNP  -echo: Left ventricle: The cavity size was normal. Wall thickness was   normal. Systolic function was vigorous. The estimated ejection   fraction was in the range of 65% to 70%. Wall motion was normal;   there were no regional wall motion abnormalities. There was an   increased relative contribution of atrial contraction to   ventricular filling. The tissue Doppler parameters were normal.   Findings consistent with left ventricular underfilling due to   right heart failure. -give lasix x 1 IV and re-assess   Hypertension BP    - patient ran out of medications for at least 1-1/2 weeks, which may have resulted in these abnormal blood pressure readings. -resume BB after appropriate wash out of cocaine   GERD, no acute symptoms Continue PPI  Peripheral neuropathy Continue Lyrica  Cocaine abuse -UDS positive    DVT prophylaxis:   SCD's  Code Status: Full Code   Family Communication: At bedside  Disposition Plan:     Consultants:      Subjective: Still with continued SOB especially with exertion  Objective: Vitals:   04/19/17 0804 04/19/17 0847 04/19/17 1157 04/19/17 1212  BP:  (!) 117/91 (!) 146/94   Pulse:  98 (!) 108   Resp:   18   Temp:   98.1 F (36.7 C)   TempSrc:   Oral   SpO2: 98%  98% 96%  Weight:      Height:        Intake/Output Summary (Last 24 hours) at 04/19/2017 1554 Last data filed at 04/19/2017 1417 Gross per 24 hour  Intake 460 ml  Output 2325 ml  Net -1865 ml   Filed Weights   04/18/17 1849 04/19/17 0534  Weight: 63.6 kg (140 lb 3.2 oz) 63.8 kg (140 lb 9.6 oz)    Examination:  General exam: chronically ill appearing, on 4L O2 Respiratory system: no increased work of breathing, diminished, not moving more air Cardiovascular system: mildly tachy Gastrointestinal system: Abdomen is nondistended, soft and nontender. No organomegaly or  masses felt. Normal bowel sounds heard. Central nervous system: Alert and oriented. No focal neurological deficits. Extremities: Symmetric 5 x 5 power. Skin: No rashes, lesions or ulcers Psychiatry: poor insight    Data Reviewed: I have personally reviewed following labs and imaging studies  CBC: Recent Labs  Lab 04/13/17 1202 04/18/17 1020 04/19/17 0642  WBC 5.3 8.8 7.7  NEUTROABS  --  7.1  --   HGB 11.6* 11.6* 11.2*  HCT 37.4* 37.2* 36.5*  MCV 82.2 80.3 81.5  PLT 328 325 352   Basic Metabolic Panel: Recent Labs  Lab 04/13/17 1202 04/18/17 1020 04/19/17 0642  NA 141 138 136  K 4.0 3.8 4.4  CL 104 98*  98*  CO2 30 26 27   GLUCOSE 160* 88 191*  BUN 16 8 11   CREATININE 0.90 0.81 0.90  CALCIUM 9.1 9.3 9.4   GFR: Estimated Creatinine Clearance: 86.6 mL/min (by C-G formula based on SCr of 0.9 mg/dL). Liver Function Tests: No results for input(s): AST, ALT, ALKPHOS, BILITOT, PROT, ALBUMIN in the last 168 hours. No results for input(s): LIPASE, AMYLASE in the last 168 hours. No results for input(s): AMMONIA in the last 168 hours. Coagulation Profile: No results for input(s): INR, PROTIME in the last 168 hours. Cardiac Enzymes: No results for input(s): CKTOTAL, CKMB, CKMBINDEX, TROPONINI in the last 168 hours. BNP (last 3 results) No results for input(s): PROBNP in the last 8760 hours. HbA1C: No results for input(s): HGBA1C in the last 72 hours. CBG: No results for input(s): GLUCAP in the last 168 hours. Lipid Profile: No results for input(s): CHOL, HDL, LDLCALC, TRIG, CHOLHDL, LDLDIRECT in the last 72 hours. Thyroid Function Tests: No results for input(s): TSH, T4TOTAL, FREET4, T3FREE, THYROIDAB in the last 72 hours. Anemia Panel: No results for input(s): VITAMINB12, FOLATE, FERRITIN, TIBC, IRON, RETICCTPCT in the last 72 hours. Urine analysis:    Component Value Date/Time   COLORURINE YELLOW 01/09/2014 1843   APPEARANCEUR CLEAR 01/09/2014 1843   LABSPEC 1.023 01/09/2014 1843   PHURINE 5.5 01/09/2014 1843   GLUCOSEU 250 (A) 01/09/2014 1843   HGBUR NEGATIVE 01/09/2014 1843   BILIRUBINUR NEGATIVE 01/09/2014 1843   KETONESUR NEGATIVE 01/09/2014 1843   PROTEINUR NEGATIVE 01/09/2014 1843   UROBILINOGEN 0.2 01/09/2014 1843   NITRITE NEGATIVE 01/09/2014 1843   LEUKOCYTESUR NEGATIVE 01/09/2014 1843     )No results found for this or any previous visit (from the past 240 hour(s)).    Anti-infectives (From admission, onward)   Start     Dose/Rate Route Frequency Ordered Stop   04/18/17 1400  doxycycline (VIBRA-TABS) tablet 100 mg     100 mg Oral Every 12 hours 04/18/17 1242      04/18/17 1130  levofloxacin (LEVAQUIN) IVPB 750 mg     750 mg 100 mL/hr over 90 Minutes Intravenous  Once 04/18/17 1125 04/18/17 1438       Radiology Studies: Dg Chest Port 1 View  Result Date: 04/18/2017 CLINICAL DATA:  Cough with respiratory distress. History of sarcoidosis EXAM: PORTABLE CHEST 1 VIEW COMPARISON:  April 13, 2017 chest radiograph and chest CT FINDINGS: There is extensive underlying fibrosis and bullous disease. There are areas of patchy consolidation in both mid lung and left base regions, stable and felt to be due to chronic scarring superimposed on fibrosis. No new opacity evident. Heart size is normal. Pulmonary vascularity reflects underlying emphysematous change. No adenopathy is appreciable by radiography. IMPRESSION: Extensive chronic scarring and fibrosis consistent with history of sarcoidosis. No new airspace  opacity. No adenopathy evident by radiography. Stable cardiac silhouette. Underlying emphysematous change. Emphysema (ICD10-J43.9). Electronically Signed   By: Bretta Bang III M.D.   On: 04/18/2017 10:06        Scheduled Meds: . calcium-vitamin D  1 tablet Oral Daily  . chlorthalidone  25 mg Oral Daily  . diltiazem  120 mg Oral Daily  . doxycycline  100 mg Oral Q12H  . enoxaparin (LOVENOX) injection  40 mg Subcutaneous Q24H  . ipratropium-albuterol  3 mL Nebulization QID  . pantoprazole  40 mg Oral Daily  . predniSONE  60 mg Oral Q breakfast  . pregabalin  200 mg Oral BID   Continuous Infusions:   LOS: 0 days    Time spent: 25 min    Joseph Art, DO [facility] Pager [phone]  If 7PM-7AM, please contact night-coverage [email or web address] Password [facility] 04/19/2017, 3:54 PM

## 2017-04-19 NOTE — Progress Notes (Signed)
RN rounded on pt. Pt states he does not need anything at this time. 

## 2017-04-20 ENCOUNTER — Ambulatory Visit: Payer: Self-pay | Admitting: Emergency Medicine

## 2017-04-20 DIAGNOSIS — J441 Chronic obstructive pulmonary disease with (acute) exacerbation: Secondary | ICD-10-CM | POA: Diagnosis not present

## 2017-04-20 DIAGNOSIS — D869 Sarcoidosis, unspecified: Secondary | ICD-10-CM | POA: Diagnosis not present

## 2017-04-20 DIAGNOSIS — J9601 Acute respiratory failure with hypoxia: Secondary | ICD-10-CM | POA: Diagnosis not present

## 2017-04-20 DIAGNOSIS — F1410 Cocaine abuse, uncomplicated: Secondary | ICD-10-CM | POA: Diagnosis not present

## 2017-04-20 LAB — CBC
HEMATOCRIT: 36 % — AB (ref 39.0–52.0)
Hemoglobin: 10.8 g/dL — ABNORMAL LOW (ref 13.0–17.0)
MCH: 24.4 pg — ABNORMAL LOW (ref 26.0–34.0)
MCHC: 30 g/dL (ref 30.0–36.0)
MCV: 81.4 fL (ref 78.0–100.0)
Platelets: 404 10*3/uL — ABNORMAL HIGH (ref 150–400)
RBC: 4.42 MIL/uL (ref 4.22–5.81)
RDW: 13.4 % (ref 11.5–15.5)
WBC: 13.9 10*3/uL — AB (ref 4.0–10.5)

## 2017-04-20 LAB — BASIC METABOLIC PANEL
ANION GAP: 11 (ref 5–15)
BUN: 13 mg/dL (ref 6–20)
CO2: 32 mmol/L (ref 22–32)
Calcium: 9.5 mg/dL (ref 8.9–10.3)
Chloride: 94 mmol/L — ABNORMAL LOW (ref 101–111)
Creatinine, Ser: 0.86 mg/dL (ref 0.61–1.24)
GFR calc non Af Amer: >60 mL/min (ref >60)
GLUCOSE: 123 mg/dL — AB (ref 65–99)
POTASSIUM: 4.1 mmol/L (ref 3.5–5.1)
Sodium: 137 mmol/L (ref 135–145)

## 2017-04-20 MED ORDER — SALINE SPRAY 0.65 % NA SOLN
1.0000 | NASAL | Status: DC | PRN
Start: 2017-04-20 — End: 2017-04-21
  Administered 2017-04-20: 1 via NASAL
  Filled 2017-04-20 (×2): qty 44

## 2017-04-20 MED ORDER — TAMSULOSIN HCL 0.4 MG PO CAPS
0.4000 mg | ORAL_CAPSULE | Freq: Every day | ORAL | Status: DC
  Administered 2017-04-20: 0.4 mg via ORAL
  Filled 2017-04-20 (×2): qty 1

## 2017-04-20 MED ORDER — FUROSEMIDE 10 MG/ML IJ SOLN
20.0000 mg | Freq: Once | INTRAMUSCULAR | Status: AC
  Administered 2017-04-20: 20 mg via INTRAVENOUS
  Filled 2017-04-20: qty 2

## 2017-04-20 NOTE — Clinical Social Work Note (Signed)
Clinical Social Work Assessment  Patient Details  Name: [name] MRN: [id number] Date of Birth: [birth year]  Date of referral:  04/20/17               Reason for consult:  Substance Use/ETOH Abuse                Permission sought to share information with:    Permission granted to share information::  No  Name::        Agency::     Relationship::     Contact Information:     Housing/Transportation Living arrangements for the past 2 months:  Single Family Home Source of Information:  Patient, Medical Team Patient Interpreter Needed:  None Criminal Activity/Legal Involvement Pertinent to Current Situation/Hospitalization:  No - Comment as needed Significant Relationships:  Adult Children, Siblings, Spouse Lives with:    Do you feel safe going back to the place where you live?  Yes Need for family participation in patient care:  Yes (Comment)  Care giving concerns:  Substance abuse.   Social Worker assessment / plan:  CSW met with patient. No supports at bedside. CSW introduced role and inquired about interest in substance abuse resources. Patient replied, "Not really but okay." CSW provided packet for both inpatient and outpatient substance abuse treatment options within the local area. No further concerns. CSW signing off as social work intervention is no longer needed.  Employment status:  Disabled (Comment on whether or not currently receiving Disability) Insurance information:  Other (Comment Required)([employer]) PT Recommendations:  Not assessed at this time Information / Referral to community resources:  Residential Substance Abuse Treatment Options, Outpatient Substance Abuse Treatment Options  Patient/Family's Response to care:  Patient agreeable to receiving resources. Patient's family supportive and involved in patient's care. Patient appreciated social work intervention.  Patient/Family's Understanding of and Emotional Response to Diagnosis, Current Treatment, and  Prognosis:  Patient has a good understanding of the reason for admission and social work consult. Patient appears pleased with hospital care.  Emotional Assessment Appearance:  Appears stated age Attitude/Demeanor/Rapport:  Engaged, Gracious Affect (typically observed):  Accepting, Appropriate, Calm, Pleasant Orientation:  Oriented to Self, Oriented to Place, Oriented to  Time, Oriented to Situation Alcohol / Substance use:  Illicit Drugs Psych involvement (Current and /or in the community):  No (Comment)  Discharge Needs  Concerns to be addressed:  Care Coordination Readmission within the last 30 days:  No Current discharge risk:  Substance Abuse Barriers to Discharge:  Continued Medical Work up   Candie Chroman, LCSW 04/20/2017, 10:29 AM

## 2017-04-20 NOTE — Progress Notes (Signed)
PROGRESS NOTE    [name]  ZOX:[id number] DOB: 1965-09-26 DOA: 04/18/2017 PCP: [facility], [facility]   Outpatient Specialists:     Brief Narrative:  [name] is a 52 y.o. male with a Past Medical History of sarcoidosis; OSA on CPAP; HTN; COPD on 4L home O2; and CHF (grade 1 diastolic dysfunction in 12/15) who presents with SOB and productive cough x 1 week.  He was seen by the ER on 4/3 and diagnosed with dyspnea and chest pain but does not appear to have been given outpatient prescriptions.  Appears to be multifactorial both fluid as well as pulm exacerbation from cocaine use.    Assessment & Plan:   Principal Problem:   Acute respiratory failure with hypoxia (HCC) Active Problems:   Sarcoid (HCC)   OSA on CPAP   Hyperglycemia   GERD (gastroesophageal reflux disease)   Neuropathy   Essential hypertension   COPD exacerbation (HCC)   Chronic respiratory failure (HCC)   Cocaine abuse (HCC)   Acute hypoxic respiratory failure on chronic likely secondary to cocaine use (per chart review, usually has issues after smoking cocaine) Doxycycline/prednisone Duonebs and Albuterol Protonix   Mucinex O2- wears 4L at home Follow with Dr. Delton Coombes, pulmonary, as OP   Sarcoidosis, followed by Pulmonary Follow with Dr. Delton Coombes as OP    Elevated BNP  -echo: Left ventricle: The cavity size was normal. Wall thickness was   normal. Systolic function was vigorous. The estimated ejection   fraction was in the range of 65% to 70%. Wall motion was normal;   there were no regional wall motion abnormalities. There was an   increased relative contribution of atrial contraction to   ventricular filling. The tissue Doppler parameters were normal.   Findings consistent with left ventricular underfilling due to   right heart failure. -lasix x 1: good urine output Will give another dose of lasix today and monitor for improvement   Hypertension BP   - patient ran out of medications for  at least 1-1/2 weeks, which may have resulted in these abnormal blood pressure readings. -resume BB after appropriate wash out of cocaine   GERD, no acute symptoms Continue PPI  Peripheral neuropathy Continue Lyrica  Cocaine abuse -UDS positive    DVT prophylaxis:   SCD's  Code Status: Full Code   Family Communication: None at bedside  Disposition Plan:     Consultants:      Subjective: Still dyspneic  Objective: Vitals:   04/20/17 0815 04/20/17 0936 04/20/17 1155 04/20/17 1333  BP:  (!) 126/98  130/88  Pulse:  90  98  Resp:  12  13  Temp:  98.2 F (36.8 C)  97.6 F (36.4 C)  TempSrc:  Oral  Oral  SpO2: 92% 100% 97% 99%  Weight:      Height:        Intake/Output Summary (Last 24 hours) at 04/20/2017 1409 Last data filed at 04/20/2017 1300 Gross per 24 hour  Intake 1030 ml  Output 2925 ml  Net -1895 ml   Filed Weights   04/18/17 1849 04/19/17 0534 04/20/17 0437  Weight: 63.6 kg (140 lb 3.2 oz) 63.8 kg (140 lb 9.6 oz) 63.5 kg (140 lb)    Examination:  General exam: in bed, mild increase work of breathing while talking Respiratory system: not moving much air Cardiovascular system: rrr- less LE edema Gastrointestinal system: +BS, soft Central nervous system: alert Extremities: Symmetric 5 x 5 power.     Data Reviewed:  I have personally reviewed following labs and imaging studies  CBC: Recent Labs  Lab 04/18/17 1020 04/19/17 0642 04/20/17 0424  WBC 8.8 7.7 13.9*  NEUTROABS 7.1  --   --   HGB 11.6* 11.2* 10.8*  HCT 37.2* 36.5* 36.0*  MCV 80.3 81.5 81.4  PLT 325 352 404*   Basic Metabolic Panel: Recent Labs  Lab 04/18/17 1020 04/19/17 0642 04/20/17 0424  NA 138 136 137  K 3.8 4.4 4.1  CL 98* 98* 94*  CO2 26 27 32  GLUCOSE 88 191* 123*  BUN 8 11 13   CREATININE 0.81 0.90 0.86  CALCIUM 9.3 9.4 9.5   GFR: Estimated Creatinine Clearance: 90.2 mL/min (by C-G formula based on SCr of 0.86 mg/dL). Liver Function Tests: No  results for input(s): AST, ALT, ALKPHOS, BILITOT, PROT, ALBUMIN in the last 168 hours. No results for input(s): LIPASE, AMYLASE in the last 168 hours. No results for input(s): AMMONIA in the last 168 hours. Coagulation Profile: No results for input(s): INR, PROTIME in the last 168 hours. Cardiac Enzymes: No results for input(s): CKTOTAL, CKMB, CKMBINDEX, TROPONINI in the last 168 hours. BNP (last 3 results) No results for input(s): PROBNP in the last 8760 hours. HbA1C: No results for input(s): HGBA1C in the last 72 hours. CBG: No results for input(s): GLUCAP in the last 168 hours. Lipid Profile: No results for input(s): CHOL, HDL, LDLCALC, TRIG, CHOLHDL, LDLDIRECT in the last 72 hours. Thyroid Function Tests: No results for input(s): TSH, T4TOTAL, FREET4, T3FREE, THYROIDAB in the last 72 hours. Anemia Panel: No results for input(s): VITAMINB12, FOLATE, FERRITIN, TIBC, IRON, RETICCTPCT in the last 72 hours. Urine analysis:    Component Value Date/Time   COLORURINE YELLOW 01/09/2014 1843   APPEARANCEUR CLEAR 01/09/2014 1843   LABSPEC 1.023 01/09/2014 1843   PHURINE 5.5 01/09/2014 1843   GLUCOSEU 250 (A) 01/09/2014 1843   HGBUR NEGATIVE 01/09/2014 1843   BILIRUBINUR NEGATIVE 01/09/2014 1843   KETONESUR NEGATIVE 01/09/2014 1843   PROTEINUR NEGATIVE 01/09/2014 1843   UROBILINOGEN 0.2 01/09/2014 1843   NITRITE NEGATIVE 01/09/2014 1843   LEUKOCYTESUR NEGATIVE 01/09/2014 1843     )No results found for this or any previous visit (from the past 240 hour(s)).    Anti-infectives (From admission, onward)   Start     Dose/Rate Route Frequency Ordered Stop   04/18/17 1400  doxycycline (VIBRA-TABS) tablet 100 mg     100 mg Oral Every 12 hours 04/18/17 1242     04/18/17 1130  levofloxacin (LEVAQUIN) IVPB 750 mg     750 mg 100 mL/hr over 90 Minutes Intravenous  Once 04/18/17 1125 04/18/17 1438       Radiology Studies: No results found.      Scheduled Meds: . calcium-vitamin D   1 tablet Oral Daily  . diltiazem  120 mg Oral Daily  . doxycycline  100 mg Oral Q12H  . enoxaparin (LOVENOX) injection  40 mg Subcutaneous Q24H  . ipratropium-albuterol  3 mL Nebulization QID  . pantoprazole  40 mg Oral Daily  . predniSONE  60 mg Oral Q breakfast  . pregabalin  200 mg Oral BID  . tamsulosin  0.4 mg Oral QPC supper   Continuous Infusions:   LOS: 0 days    Time spent: 25 min    Joseph ArtJessica U Lasheika Ortloff, DO [facility] Pager [phone]  If 7PM-7AM, please contact night-coverage [email or web address] Password [email or web address] 04/20/2017, 2:09 PM

## 2017-04-21 DIAGNOSIS — F1410 Cocaine abuse, uncomplicated: Secondary | ICD-10-CM | POA: Diagnosis not present

## 2017-04-21 DIAGNOSIS — J9601 Acute respiratory failure with hypoxia: Secondary | ICD-10-CM | POA: Diagnosis not present

## 2017-04-21 DIAGNOSIS — J441 Chronic obstructive pulmonary disease with (acute) exacerbation: Secondary | ICD-10-CM | POA: Diagnosis not present

## 2017-04-21 DIAGNOSIS — I10 Essential (primary) hypertension: Secondary | ICD-10-CM | POA: Diagnosis not present

## 2017-04-21 LAB — CBC
HCT: 39.6 % (ref 39.0–52.0)
Hemoglobin: 12 g/dL — ABNORMAL LOW (ref 13.0–17.0)
MCH: 25 pg — AB (ref 26.0–34.0)
MCHC: 30.3 g/dL (ref 30.0–36.0)
MCV: 82.5 fL (ref 78.0–100.0)
PLATELETS: 421 10*3/uL — AB (ref 150–400)
RBC: 4.8 MIL/uL (ref 4.22–5.81)
RDW: 13.7 % (ref 11.5–15.5)
WBC: 11.7 10*3/uL — AB (ref 4.0–10.5)

## 2017-04-21 LAB — BASIC METABOLIC PANEL
Anion gap: 10 (ref 5–15)
BUN: 16 mg/dL (ref 6–20)
CO2: 34 mmol/L — ABNORMAL HIGH (ref 22–32)
Calcium: 9.5 mg/dL (ref 8.9–10.3)
Chloride: 92 mmol/L — ABNORMAL LOW (ref 101–111)
Creatinine, Ser: 0.98 mg/dL (ref 0.61–1.24)
Glucose, Bld: 122 mg/dL — ABNORMAL HIGH (ref 65–99)
Potassium: 4.3 mmol/L (ref 3.5–5.1)
SODIUM: 136 mmol/L (ref 135–145)

## 2017-04-21 LAB — BRAIN NATRIURETIC PEPTIDE: B NATRIURETIC PEPTIDE 5: 330.9 pg/mL — AB (ref 0.0–100.0)

## 2017-04-21 MED ORDER — DILTIAZEM HCL ER COATED BEADS 180 MG PO CP24: 180.0000 mg | ORAL_CAPSULE | Freq: Every day | ORAL | 0 refills | Status: AC

## 2017-04-21 MED ORDER — DILTIAZEM HCL ER COATED BEADS 180 MG PO CP24
180.0000 mg | ORAL_CAPSULE | Freq: Every day | ORAL | Status: DC
  Administered 2017-04-21: 180 mg via ORAL
  Filled 2017-04-21: qty 1

## 2017-04-21 MED ORDER — PREDNISONE 20 MG PO TABS: 40.0000 mg | ORAL_TABLET | Freq: Every day | ORAL | 0 refills | Status: AC

## 2017-04-21 MED ORDER — FUROSEMIDE 40 MG PO TABS: 40.0000 mg | ORAL_TABLET | Freq: Every day | ORAL | 0 refills | Status: AC

## 2017-04-21 MED ORDER — POTASSIUM CHLORIDE CRYS ER 20 MEQ PO TBCR: 20.0000 meq | EXTENDED_RELEASE_TABLET | Freq: Every day | ORAL | Status: DC

## 2017-04-21 MED ORDER — TAMSULOSIN HCL 0.4 MG PO CAPS: 0.4000 mg | ORAL_CAPSULE | Freq: Every day | ORAL | 0 refills | Status: AC

## 2017-04-21 MED ORDER — FUROSEMIDE 40 MG PO TABS
40.0000 mg | ORAL_TABLET | Freq: Every day | ORAL | Status: DC
  Administered 2017-04-21: 40 mg via ORAL
  Filled 2017-04-21: qty 1

## 2017-04-21 MED ORDER — POTASSIUM CHLORIDE CRYS ER 20 MEQ PO TBCR: 20.0000 meq | EXTENDED_RELEASE_TABLET | Freq: Every day | ORAL | 0 refills | Status: AC

## 2017-04-21 NOTE — Discharge Summary (Signed)
Physician Discharge Summary  Saxton Chain ZOX:[id number] DOB: 1965/02/10 DOA: 04/18/2017  PCP: [facility], [facility]  Admit date: 04/18/2017 Discharge date: 04/21/2017   Recommendations for Outpatient Follow-Up:   BMP 1 week-- lasix added   Discharge Diagnosis:   Principal Problem:   Acute respiratory failure with hypoxia (HCC) Active Problems:   Sarcoid (HCC)   OSA on CPAP   Hyperglycemia   GERD (gastroesophageal reflux disease)   Neuropathy   Essential hypertension   COPD exacerbation (HCC)   Chronic respiratory failure (HCC)   Cocaine abuse (HCC)   Discharge disposition:  Home.   Discharge Condition: Improved.  Diet recommendation: Low sodium, heart healthy  Wound care: None.   History of Present Illness:   [name] a 52 y.o.malewith a Past Medical History of sarcoidosis; OSA on CPAP; HTN; COPD on 4L home O2; and CHF (grade 1 diastolic dysfunction in 12/15) who presents with SOB and productive cough x 1 week. He was seen by the ER on 4/3 and diagnosed with dyspnea and chest pain but does not appear to have been given outpatient prescriptions. Appears to be multifactorial both fluid as well as pulm exacerbation from cocaine use.      Hospital Course by Problem:   Acute hypoxic respiratory failure on chronic likely secondary to cocaine use (per chart review, usually has issues after smoking cocaine) -cocaine cessation encouraged Doxycycline/prednisone Duonebs and Albuterol Protonix  Mucinex O2- wears 4L at home Follow with Dr. Delton Coombes, pulmonary, as OP  Sarcoidosis, followed by Pulmonary Follow with Dr. Delton Coombes as OP   Elevated BNP -echo: Left ventricle: The cavity size was normal. Wall thickness was normal. Systolic function was vigorous. The estimated ejection fraction was in the range of 65% to 70%. Wall motion was normal; there were no regional wall motion abnormalities. There was an increased relative contribution of  atrial contraction to ventricular filling. The tissue Doppler parameters were normal. Findings consistent with left ventricular underfilling due to right heart failure. -lasix x 2 with  good urine output -send home on PO lasix/kdur  HypertensionBP  - patient ran out of medications for at least 1-1/2 weeks, which may have resulted in these abnormal blood pressure readings. -says his medications are being shipped to him  GERD,no acute symptoms Continue PPI  Peripheral neuropathy Continue Lyrica  Cocaine abuse -UDS positive      Medical Consultants:    None.   Discharge Exam:   Vitals:   04/21/17 0527 04/21/17 0744  BP: (!) 128/97   Pulse: (!) 105   Resp: 18   Temp: 97.8 F (36.6 C)   SpO2: 100% 99%   Vitals:   04/20/17 1925 04/20/17 2038 04/21/17 0527 04/21/17 0744  BP: 114/80  (!) 128/97   Pulse: (!) 105  (!) 105   Resp: 18  18   Temp: 98.1 F (36.7 C)  97.8 F (36.6 C)   TempSrc: Oral  Oral   SpO2: 98% 98% 100% 99%  Weight:   62.2 kg (137 lb 3.2 oz)   Height:        Gen:  NAD-- feels breathing back to baseline   The results of significant diagnostics from this hospitalization (including imaging, microbiology, ancillary and laboratory) are listed below for reference.     Procedures and Diagnostic Studies:   Dg Chest Port 1 View  Result Date: 04/18/2017 CLINICAL DATA:  Cough with respiratory distress. History of sarcoidosis EXAM: PORTABLE CHEST 1 VIEW COMPARISON:  April 13, 2017 chest radiograph and  chest CT FINDINGS: There is extensive underlying fibrosis and bullous disease. There are areas of patchy consolidation in both mid lung and left base regions, stable and felt to be due to chronic scarring superimposed on fibrosis. No new opacity evident. Heart size is normal. Pulmonary vascularity reflects underlying emphysematous change. No adenopathy is appreciable by radiography. IMPRESSION: Extensive chronic scarring and fibrosis consistent  with history of sarcoidosis. No new airspace opacity. No adenopathy evident by radiography. Stable cardiac silhouette. Underlying emphysematous change. Emphysema (ICD10-J43.9). Electronically Signed   By: Bretta BangWilliam  Woodruff III M.D.   On: 04/18/2017 10:06     Labs:   Basic Metabolic Panel: Recent Labs  Lab 04/18/17 1020 04/19/17 0642 04/20/17 0424 04/21/17 0531  NA 138 136 137 136  K 3.8 4.4 4.1 4.3  CL 98* 98* 94* 92*  CO2 26 27 32 34*  GLUCOSE 88 191* 123* 122*  BUN 8 11 13 16   CREATININE 0.81 0.90 0.86 0.98  CALCIUM 9.3 9.4 9.5 9.5   GFR Estimated Creatinine Clearance: 77.6 mL/min (by C-G formula based on SCr of 0.98 mg/dL). Liver Function Tests: No results for input(s): AST, ALT, ALKPHOS, BILITOT, PROT, ALBUMIN in the last 168 hours. No results for input(s): LIPASE, AMYLASE in the last 168 hours. No results for input(s): AMMONIA in the last 168 hours. Coagulation profile No results for input(s): INR, PROTIME in the last 168 hours.  CBC: Recent Labs  Lab 04/18/17 1020 04/19/17 0642 04/20/17 0424 04/21/17 0531  WBC 8.8 7.7 13.9* 11.7*  NEUTROABS 7.1  --   --   --   HGB 11.6* 11.2* 10.8* 12.0*  HCT 37.2* 36.5* 36.0* 39.6  MCV 80.3 81.5 81.4 82.5  PLT 325 352 404* 421*   Cardiac Enzymes: No results for input(s): CKTOTAL, CKMB, CKMBINDEX, TROPONINI in the last 168 hours. BNP: Invalid input(s): POCBNP CBG: No results for input(s): GLUCAP in the last 168 hours. D-Dimer No results for input(s): DDIMER in the last 72 hours. Hgb A1c No results for input(s): HGBA1C in the last 72 hours. Lipid Profile No results for input(s): CHOL, HDL, LDLCALC, TRIG, CHOLHDL, LDLDIRECT in the last 72 hours. Thyroid function studies No results for input(s): TSH, T4TOTAL, T3FREE, THYROIDAB in the last 72 hours.  Invalid input(s): FREET3 Anemia work up No results for input(s): VITAMINB12, FOLATE, FERRITIN, TIBC, IRON, RETICCTPCT in the last 72 hours. Microbiology No results found  for this or any previous visit (from the past 240 hour(s)).   Discharge Instructions:   Discharge Instructions    (HEART FAILURE PATIENTS) Call MD:  Anytime you have any of the following symptoms: 1) 3 pound weight gain in 24 hours or 5 pounds in 1 week 2) shortness of breath, with or without a dry hacking cough 3) swelling in the hands, feet or stomach 4) if you have to sleep on extra pillows at night in order to breathe.   Complete by:  As directed    Diet - low sodium heart healthy   Complete by:  As directed    Discharge instructions   Complete by:  As directed    BMP 1 week   Increase activity slowly   Complete by:  As directed      Allergies as of 04/21/2017      Reactions   Clindamycin/lincomycin Anaphylaxis, Swelling   Made face and tongue swell      Medication List    STOP taking these medications   chlorthalidone 25 MG tablet Commonly known as:  HYGROTON  TAKE these medications   albuterol 108 (90 Base) MCG/ACT inhaler Commonly known as:  PROVENTIL HFA;VENTOLIN HFA Inhale 2 puffs into the lungs every 6 (six) hours as needed for wheezing or shortness of breath.   albuterol (2.5 MG/3ML) 0.083% nebulizer solution Commonly known as:  PROVENTIL USE 1 VIAL IN NEBULIZER EVERY 6 HOURS AS NEEDED FOR WHEEZING   BENADRYL 25 MG tablet Generic drug:  diphenhydrAMINE Take 25 mg by mouth every 6 (six) hours as needed for itching or allergies.   calcium-vitamin D 500-200 MG-UNIT tablet Commonly known as:  OSCAL WITH D Take 1 tablet by mouth daily.   cyclobenzaprine 10 MG tablet Commonly known as:  FLEXERIL Take 10 mg by mouth 3 (three) times daily as needed for muscle spasms.   diltiazem 180 MG 24 hr capsule Commonly known as:  CARDIZEM CD Take 1 capsule (180 mg total) by mouth daily. What changed:    medication strength  how much to take  when to take this   ferrous sulfate 325 (65 FE) MG tablet Take 325 mg by mouth 2 (two) times daily with a meal.     FLUTTER Devi As directed.   furosemide 40 MG tablet Commonly known as:  LASIX Take 1 tablet (40 mg total) by mouth daily.   ipratropium-albuterol 0.5-2.5 (3) MG/3ML Soln Commonly known as:  DUONEB Take 3 mLs by nebulization every 4 (four) hours as needed. What changed:  reasons to take this   multivitamin with minerals tablet Take 1 tablet by mouth every morning.   omeprazole 40 MG capsule Commonly known as:  PRILOSEC Take 40 mg by mouth daily.   ondansetron 4 MG tablet Commonly known as:  ZOFRAN Take 1 tablet (4 mg total) by mouth every 8 (eight) hours as needed for nausea or vomiting.   OXYGEN Inhale 4 L into the lungs continuous.   potassium chloride SA 20 MEQ tablet Commonly known as:  K-DUR,KLOR-CON Take 1 tablet (20 mEq total) by mouth daily. Start taking on:  05/14/2017   predniSONE 20 MG tablet Commonly known as:  DELTASONE Take 2 tablets (40 mg total) by mouth daily with breakfast.   pregabalin 200 MG capsule Commonly known as:  LYRICA Take 200 mg by mouth 2 (two) times daily.   saline Gel Place 1 application into the nose every 4 (four) hours as needed (dryness/irritation).   SYMBICORT 160-4.5 MCG/ACT inhaler Generic drug:  budesonide-formoterol Inhale 1 puff into the lungs 2 (two) times daily.   tamsulosin 0.4 MG Caps capsule Commonly known as:  FLOMAX Take 1 capsule (0.4 mg total) by mouth daily after supper.   Vitamin D2 400 units Tabs Take 400 Units by mouth daily.      Follow-up Information    [facility], [facility] Follow up in 1 week(s).   Why:  BMP Contact information: [facility] [address] [place] [address] [phone]            Time coordinating discharge: 35 min  Signed:  Joseph Art   [facility] 04/21/2017, 8:01 AM

## 2017-04-21 NOTE — Progress Notes (Addendum)
Pt discharge instructions reviewed with pt. Pt verbalizes understanding. Pt states he has no questions. Pt belongings with pt. Pt is not in distress. Pt discharged via wheelchair on 4L oxygen. Pt's friend is driving him home.

## 2017-04-22 ENCOUNTER — Other Ambulatory Visit: Payer: Self-pay

## 2017-04-22 ENCOUNTER — Other Ambulatory Visit (HOSPITAL_COMMUNITY): Payer: Self-pay

## 2017-04-22 ENCOUNTER — Emergency Department (HOSPITAL_COMMUNITY): Payer: 59

## 2017-04-22 ENCOUNTER — Inpatient Hospital Stay (HOSPITAL_COMMUNITY)
Admission: EM | Admit: 2017-04-22 | Discharge: 2017-06-11 | DRG: 208 | Disposition: E | Payer: 59 | Attending: Emergency Medicine | Admitting: Emergency Medicine

## 2017-04-22 ENCOUNTER — Encounter (HOSPITAL_COMMUNITY): Payer: Self-pay | Admitting: Emergency Medicine

## 2017-04-22 ENCOUNTER — Inpatient Hospital Stay (HOSPITAL_COMMUNITY): Payer: 59

## 2017-04-22 DIAGNOSIS — J439 Emphysema, unspecified: Secondary | ICD-10-CM | POA: Diagnosis not present

## 2017-04-22 DIAGNOSIS — J189 Pneumonia, unspecified organism: Secondary | ICD-10-CM | POA: Diagnosis not present

## 2017-04-22 DIAGNOSIS — R1013 Epigastric pain: Secondary | ICD-10-CM

## 2017-04-22 DIAGNOSIS — I21A1 Myocardial infarction type 2: Secondary | ICD-10-CM | POA: Diagnosis not present

## 2017-04-22 DIAGNOSIS — L52 Erythema nodosum: Secondary | ICD-10-CM

## 2017-04-22 DIAGNOSIS — F1410 Cocaine abuse, uncomplicated: Secondary | ICD-10-CM | POA: Diagnosis present

## 2017-04-22 DIAGNOSIS — F419 Anxiety disorder, unspecified: Secondary | ICD-10-CM | POA: Diagnosis present

## 2017-04-22 DIAGNOSIS — E875 Hyperkalemia: Secondary | ICD-10-CM

## 2017-04-22 DIAGNOSIS — J181 Lobar pneumonia, unspecified organism: Secondary | ICD-10-CM

## 2017-04-22 DIAGNOSIS — I5032 Chronic diastolic (congestive) heart failure: Secondary | ICD-10-CM | POA: Diagnosis present

## 2017-04-22 DIAGNOSIS — R739 Hyperglycemia, unspecified: Secondary | ICD-10-CM

## 2017-04-22 DIAGNOSIS — I471 Supraventricular tachycardia, unspecified: Secondary | ICD-10-CM | POA: Diagnosis not present

## 2017-04-22 DIAGNOSIS — Z681 Body mass index (BMI) 19 or less, adult: Secondary | ICD-10-CM | POA: Diagnosis not present

## 2017-04-22 DIAGNOSIS — J9622 Acute and chronic respiratory failure with hypercapnia: Secondary | ICD-10-CM | POA: Diagnosis not present

## 2017-04-22 DIAGNOSIS — J441 Chronic obstructive pulmonary disease with (acute) exacerbation: Secondary | ICD-10-CM | POA: Diagnosis not present

## 2017-04-22 DIAGNOSIS — Z7951 Long term (current) use of inhaled steroids: Secondary | ICD-10-CM

## 2017-04-22 DIAGNOSIS — J939 Pneumothorax, unspecified: Secondary | ICD-10-CM

## 2017-04-22 DIAGNOSIS — Z9289 Personal history of other medical treatment: Secondary | ICD-10-CM

## 2017-04-22 DIAGNOSIS — Z9689 Presence of other specified functional implants: Secondary | ICD-10-CM

## 2017-04-22 DIAGNOSIS — G4733 Obstructive sleep apnea (adult) (pediatric): Secondary | ICD-10-CM

## 2017-04-22 DIAGNOSIS — J9602 Acute respiratory failure with hypercapnia: Secondary | ICD-10-CM | POA: Diagnosis not present

## 2017-04-22 DIAGNOSIS — E872 Acidosis: Secondary | ICD-10-CM | POA: Diagnosis not present

## 2017-04-22 DIAGNOSIS — T797XXA Traumatic subcutaneous emphysema, initial encounter: Secondary | ICD-10-CM | POA: Diagnosis not present

## 2017-04-22 DIAGNOSIS — R001 Bradycardia, unspecified: Secondary | ICD-10-CM | POA: Diagnosis not present

## 2017-04-22 DIAGNOSIS — J9600 Acute respiratory failure, unspecified whether with hypoxia or hypercapnia: Secondary | ICD-10-CM

## 2017-04-22 DIAGNOSIS — Z9981 Dependence on supplemental oxygen: Secondary | ICD-10-CM

## 2017-04-22 DIAGNOSIS — R64 Cachexia: Secondary | ICD-10-CM | POA: Diagnosis present

## 2017-04-22 DIAGNOSIS — L819 Disorder of pigmentation, unspecified: Secondary | ICD-10-CM

## 2017-04-22 DIAGNOSIS — I10 Essential (primary) hypertension: Secondary | ICD-10-CM

## 2017-04-22 DIAGNOSIS — D869 Sarcoidosis, unspecified: Secondary | ICD-10-CM | POA: Diagnosis present

## 2017-04-22 DIAGNOSIS — F1110 Opioid abuse, uncomplicated: Secondary | ICD-10-CM | POA: Diagnosis present

## 2017-04-22 DIAGNOSIS — R40242 Glasgow coma scale score 9-12, unspecified time: Secondary | ICD-10-CM | POA: Diagnosis present

## 2017-04-22 DIAGNOSIS — J9311 Primary spontaneous pneumothorax: Principal | ICD-10-CM | POA: Diagnosis present

## 2017-04-22 DIAGNOSIS — D638 Anemia in other chronic diseases classified elsewhere: Secondary | ICD-10-CM | POA: Diagnosis present

## 2017-04-22 DIAGNOSIS — Z7151 Drug abuse counseling and surveillance of drug abuser: Secondary | ICD-10-CM

## 2017-04-22 DIAGNOSIS — J9382 Other air leak: Secondary | ICD-10-CM | POA: Diagnosis not present

## 2017-04-22 DIAGNOSIS — J9601 Acute respiratory failure with hypoxia: Secondary | ICD-10-CM | POA: Diagnosis not present

## 2017-04-22 DIAGNOSIS — M79672 Pain in left foot: Secondary | ICD-10-CM

## 2017-04-22 DIAGNOSIS — Z7952 Long term (current) use of systemic steroids: Secondary | ICD-10-CM

## 2017-04-22 DIAGNOSIS — Z01818 Encounter for other preprocedural examination: Secondary | ICD-10-CM

## 2017-04-22 DIAGNOSIS — J471 Bronchiectasis with (acute) exacerbation: Secondary | ICD-10-CM | POA: Diagnosis present

## 2017-04-22 DIAGNOSIS — M79671 Pain in right foot: Secondary | ICD-10-CM

## 2017-04-22 DIAGNOSIS — Z9989 Dependence on other enabling machines and devices: Secondary | ICD-10-CM

## 2017-04-22 DIAGNOSIS — Z4682 Encounter for fitting and adjustment of non-vascular catheter: Secondary | ICD-10-CM

## 2017-04-22 DIAGNOSIS — G629 Polyneuropathy, unspecified: Secondary | ICD-10-CM

## 2017-04-22 DIAGNOSIS — Z978 Presence of other specified devices: Secondary | ICD-10-CM | POA: Diagnosis not present

## 2017-04-22 DIAGNOSIS — I110 Hypertensive heart disease with heart failure: Secondary | ICD-10-CM | POA: Diagnosis present

## 2017-04-22 DIAGNOSIS — D860 Sarcoidosis of lung: Secondary | ICD-10-CM

## 2017-04-22 DIAGNOSIS — K30 Functional dyspepsia: Secondary | ICD-10-CM | POA: Diagnosis not present

## 2017-04-22 DIAGNOSIS — J9611 Chronic respiratory failure with hypoxia: Secondary | ICD-10-CM

## 2017-04-22 DIAGNOSIS — K219 Gastro-esophageal reflux disease without esophagitis: Secondary | ICD-10-CM | POA: Diagnosis present

## 2017-04-22 DIAGNOSIS — R000 Tachycardia, unspecified: Secondary | ICD-10-CM | POA: Diagnosis not present

## 2017-04-22 DIAGNOSIS — R238 Other skin changes: Secondary | ICD-10-CM

## 2017-04-22 DIAGNOSIS — Z881 Allergy status to other antibiotic agents status: Secondary | ICD-10-CM

## 2017-04-22 DIAGNOSIS — Z87891 Personal history of nicotine dependence: Secondary | ICD-10-CM

## 2017-04-22 DIAGNOSIS — Y95 Nosocomial condition: Secondary | ICD-10-CM | POA: Diagnosis present

## 2017-04-22 DIAGNOSIS — G9341 Metabolic encephalopathy: Secondary | ICD-10-CM | POA: Diagnosis not present

## 2017-04-22 DIAGNOSIS — I2781 Cor pulmonale (chronic): Secondary | ICD-10-CM | POA: Diagnosis present

## 2017-04-22 DIAGNOSIS — J9621 Acute and chronic respiratory failure with hypoxia: Secondary | ICD-10-CM | POA: Diagnosis present

## 2017-04-22 DIAGNOSIS — D649 Anemia, unspecified: Secondary | ICD-10-CM

## 2017-04-22 DIAGNOSIS — R0902 Hypoxemia: Secondary | ICD-10-CM | POA: Diagnosis not present

## 2017-04-22 DIAGNOSIS — I214 Non-ST elevation (NSTEMI) myocardial infarction: Secondary | ICD-10-CM | POA: Diagnosis not present

## 2017-04-22 DIAGNOSIS — Z720 Tobacco use: Secondary | ICD-10-CM | POA: Diagnosis present

## 2017-04-22 DIAGNOSIS — R233 Spontaneous ecchymoses: Secondary | ICD-10-CM

## 2017-04-22 DIAGNOSIS — J9690 Respiratory failure, unspecified, unspecified whether with hypoxia or hypercapnia: Secondary | ICD-10-CM

## 2017-04-22 DIAGNOSIS — R0602 Shortness of breath: Secondary | ICD-10-CM

## 2017-04-22 DIAGNOSIS — Z902 Acquired absence of lung [part of]: Secondary | ICD-10-CM

## 2017-04-22 DIAGNOSIS — Z79899 Other long term (current) drug therapy: Secondary | ICD-10-CM

## 2017-04-22 DIAGNOSIS — J9312 Secondary spontaneous pneumothorax: Secondary | ICD-10-CM | POA: Diagnosis not present

## 2017-04-22 DIAGNOSIS — E46 Unspecified protein-calorie malnutrition: Secondary | ICD-10-CM | POA: Diagnosis present

## 2017-04-22 DIAGNOSIS — G9340 Encephalopathy, unspecified: Secondary | ICD-10-CM | POA: Diagnosis not present

## 2017-04-22 DIAGNOSIS — R0603 Acute respiratory distress: Secondary | ICD-10-CM

## 2017-04-22 DIAGNOSIS — R040 Epistaxis: Secondary | ICD-10-CM

## 2017-04-22 LAB — PROTIME-INR
INR: 0.91
Prothrombin Time: 12.2 seconds (ref 11.4–15.2)

## 2017-04-22 LAB — I-STAT CHEM 8, ED
BUN: 23 mg/dL — AB (ref 6–20)
CHLORIDE: 89 mmol/L — AB (ref 101–111)
CREATININE: 0.9 mg/dL (ref 0.61–1.24)
Calcium, Ion: 1.13 mmol/L — ABNORMAL LOW (ref 1.15–1.40)
Glucose, Bld: 217 mg/dL — ABNORMAL HIGH (ref 65–99)
HEMATOCRIT: 46 % (ref 39.0–52.0)
Hemoglobin: 15.6 g/dL (ref 13.0–17.0)
Potassium: 4.9 mmol/L (ref 3.5–5.1)
Sodium: 131 mmol/L — ABNORMAL LOW (ref 135–145)
TCO2: 36 mmol/L — AB (ref 22–32)

## 2017-04-22 LAB — COMPREHENSIVE METABOLIC PANEL
ALT: 31 U/L (ref 17–63)
AST: 38 U/L (ref 15–41)
Albumin: 4 g/dL (ref 3.5–5.0)
Alkaline Phosphatase: 80 U/L (ref 38–126)
Anion gap: 13 (ref 5–15)
BUN: 22 mg/dL — ABNORMAL HIGH (ref 6–20)
CO2: 32 mmol/L (ref 22–32)
Calcium: 9.1 mg/dL (ref 8.9–10.3)
Chloride: 88 mmol/L — ABNORMAL LOW (ref 101–111)
Creatinine, Ser: 0.95 mg/dL (ref 0.61–1.24)
GFR calc Af Amer: >60 mL/min (ref >60)
GFR calc non Af Amer: >60 mL/min (ref >60)
Glucose, Bld: 213 mg/dL — ABNORMAL HIGH (ref 65–99)
Potassium: 5 mmol/L (ref 3.5–5.1)
Sodium: 133 mmol/L — ABNORMAL LOW (ref 135–145)
Total Bilirubin: 0.6 mg/dL (ref 0.3–1.2)
Total Protein: 8.1 g/dL (ref 6.5–8.1)

## 2017-04-22 LAB — URINALYSIS, ROUTINE W REFLEX MICROSCOPIC
Bilirubin Urine: NEGATIVE
Glucose, UA: NEGATIVE mg/dL
Ketones, ur: NEGATIVE mg/dL
Leukocytes, UA: NEGATIVE
Nitrite: NEGATIVE
Protein, ur: 30 mg/dL — AB
Specific Gravity, Urine: 1.017 (ref 1.005–1.030)
Squamous Epithelial / HPF: NONE SEEN
pH: 5 (ref 5.0–8.0)

## 2017-04-22 LAB — CBC WITH DIFFERENTIAL/PLATELET
BASOS PCT: 0 %
Basophils Absolute: 0 10*3/uL (ref 0.0–0.1)
Eosinophils Absolute: 0 10*3/uL (ref 0.0–0.7)
Eosinophils Relative: 0 %
HEMATOCRIT: 43.5 % (ref 39.0–52.0)
HEMOGLOBIN: 13.5 g/dL (ref 13.0–17.0)
LYMPHS ABS: 1.1 10*3/uL (ref 0.7–4.0)
LYMPHS PCT: 7 %
MCH: 25.1 pg — AB (ref 26.0–34.0)
MCHC: 31 g/dL (ref 30.0–36.0)
MCV: 81 fL (ref 78.0–100.0)
MONOS PCT: 5 %
Monocytes Absolute: 0.7 10*3/uL (ref 0.1–1.0)
NEUTROS ABS: 12.4 10*3/uL — AB (ref 1.7–7.7)
Neutrophils Relative %: 88 %
Platelets: 483 10*3/uL — ABNORMAL HIGH (ref 150–400)
RBC: 5.37 MIL/uL (ref 4.22–5.81)
RDW: 13.5 % (ref 11.5–15.5)
WBC: 14.2 10*3/uL — ABNORMAL HIGH (ref 4.0–10.5)

## 2017-04-22 LAB — HIV ANTIBODY (ROUTINE TESTING W REFLEX): HIV Screen 4th Generation wRfx: NONREACTIVE

## 2017-04-22 LAB — BLOOD GAS, ARTERIAL
Acid-Base Excess: 5 mmol/L — ABNORMAL HIGH (ref 0.0–2.0)
Acid-Base Excess: 5.3 mmol/L — ABNORMAL HIGH (ref 0.0–2.0)
Bicarbonate: 31.3 mmol/L — ABNORMAL HIGH (ref 20.0–28.0)
Bicarbonate: 29.1 mmol/L — ABNORMAL HIGH (ref 20.0–28.0)
DRAWN BY: 232811
Drawn by: 232811
FIO2: 100
FIO2: 40
MECHVT: 450 mL
MECHVT: 450 mL
O2 Saturation: 99.4 %
O2 Saturation: 98.8 %
PATIENT TEMPERATURE: 98
PCO2 ART: 40.1 mmHg (ref 32.0–48.0)
PEEP/CPAP: 5 cmH2O
PEEP: 5 cmH2O
Patient temperature: 97.5
RATE: 24 resp/min
RATE: 24 resp/min
pCO2 arterial: 55.1 mmHg — ABNORMAL HIGH (ref 32.0–48.0)
pH, Arterial: 7.37 (ref 7.350–7.450)
pH, Arterial: 7.472 — ABNORMAL HIGH (ref 7.350–7.450)
pO2, Arterial: 337 mmHg — ABNORMAL HIGH (ref 83.0–108.0)
pO2, Arterial: 142 mmHg — ABNORMAL HIGH (ref 83.0–108.0)

## 2017-04-22 LAB — BASIC METABOLIC PANEL
Anion gap: 18 — ABNORMAL HIGH (ref 5–15)
BUN: 24 mg/dL — AB (ref 6–20)
CALCIUM: 9.4 mg/dL (ref 8.9–10.3)
CO2: 28 mmol/L (ref 22–32)
CREATININE: 1.09 mg/dL (ref 0.61–1.24)
Chloride: 90 mmol/L — ABNORMAL LOW (ref 101–111)
GFR calc Af Amer: >60 mL/min (ref >60)
Glucose, Bld: 116 mg/dL — ABNORMAL HIGH (ref 65–99)
Potassium: 3.9 mmol/L (ref 3.5–5.1)
Sodium: 136 mmol/L (ref 135–145)

## 2017-04-22 LAB — PROCALCITONIN: PROCALCITONIN: 0.21 ng/mL

## 2017-04-22 LAB — CBC
HCT: 39.6 % (ref 39.0–52.0)
Hemoglobin: 12.3 g/dL — ABNORMAL LOW (ref 13.0–17.0)
MCH: 25.4 pg — AB (ref 26.0–34.0)
MCHC: 31.1 g/dL (ref 30.0–36.0)
MCV: 81.6 fL (ref 78.0–100.0)
PLATELETS: 418 10*3/uL — AB (ref 150–400)
RBC: 4.85 MIL/uL (ref 4.22–5.81)
RDW: 13.4 % (ref 11.5–15.5)
WBC: 14.8 10*3/uL — AB (ref 4.0–10.5)

## 2017-04-22 LAB — RAPID URINE DRUG SCREEN, HOSP PERFORMED
Amphetamines: NOT DETECTED
Barbiturates: NOT DETECTED
Benzodiazepines: NOT DETECTED
Cocaine: POSITIVE — AB
Opiates: POSITIVE — AB
Tetrahydrocannabinol: NOT DETECTED

## 2017-04-22 LAB — TROPONIN I
Troponin I: 0.03 ng/mL (ref <0.03)
Troponin I: 0.04 ng/mL (ref <0.03)
Troponin I: 0.04 ng/mL (ref <0.03)
Troponin I: 0.03 ng/mL (ref <0.03)

## 2017-04-22 LAB — GLUCOSE, CAPILLARY
GLUCOSE-CAPILLARY: 100 mg/dL — AB (ref 65–99)
Glucose-Capillary: 119 mg/dL — ABNORMAL HIGH (ref 65–99)
Glucose-Capillary: 109 mg/dL — ABNORMAL HIGH (ref 65–99)
Glucose-Capillary: 105 mg/dL — ABNORMAL HIGH (ref 65–99)
Glucose-Capillary: 172 mg/dL — ABNORMAL HIGH (ref 65–99)

## 2017-04-22 LAB — MAGNESIUM: Magnesium: 2.3 mg/dL (ref 1.7–2.4)

## 2017-04-22 LAB — I-STAT CG4 LACTIC ACID, ED: Lactic Acid, Venous: 3.23 mmol/L (ref 0.5–1.9)

## 2017-04-22 LAB — TRIGLYCERIDES: TRIGLYCERIDES: 101 mg/dL (ref <150)

## 2017-04-22 LAB — PHOSPHORUS: PHOSPHORUS: 2.9 mg/dL (ref 2.5–4.6)

## 2017-04-22 LAB — BRAIN NATRIURETIC PEPTIDE: B Natriuretic Peptide: 149.8 pg/mL — ABNORMAL HIGH (ref 0.0–100.0)

## 2017-04-22 MED ORDER — SODIUM CHLORIDE 0.9 % IV BOLUS
1000.0000 mL | Freq: Once | INTRAVENOUS | Status: AC
  Administered 2017-04-22: 1000 mL via INTRAVENOUS

## 2017-04-22 MED ORDER — PANTOPRAZOLE SODIUM 40 MG PO PACK: 40.0000 mg | PACK | Freq: Every day | ORAL | Status: DC

## 2017-04-22 MED ORDER — LIDOCAINE HCL 2 % IJ SOLN
INTRAMUSCULAR | Status: AC
  Filled 2017-04-22: qty 20

## 2017-04-22 MED ORDER — DOCUSATE SODIUM 50 MG/5ML PO LIQD
100.0000 mg | Freq: Two times a day (BID) | ORAL | Status: DC | PRN
  Filled 2017-04-22: qty 10

## 2017-04-22 MED ORDER — MIDAZOLAM HCL 5 MG/5ML IJ SOLN
INTRAMUSCULAR | Status: AC | PRN
  Administered 2017-04-22: 1 mg via INTRAVENOUS

## 2017-04-22 MED ORDER — BISACODYL 10 MG RE SUPP: 10.0000 mg | Freq: Every day | RECTAL | Status: DC | PRN

## 2017-04-22 MED ORDER — FENTANYL CITRATE (PF) 100 MCG/2ML IJ SOLN
100.0000 ug | INTRAMUSCULAR | Status: DC | PRN
  Administered 2017-04-22: 100 ug via INTRAVENOUS
  Filled 2017-04-22: qty 2

## 2017-04-22 MED ORDER — PROPOFOL 1000 MG/100ML IV EMUL
INTRAVENOUS | Status: AC
  Filled 2017-04-22: qty 100

## 2017-04-22 MED ORDER — HYDROCODONE-ACETAMINOPHEN 5-325 MG PO TABS
1.0000 | ORAL_TABLET | ORAL | Status: DC | PRN
  Administered 2017-04-23 (×4): 2 via ORAL
  Filled 2017-04-22 (×4): qty 2

## 2017-04-22 MED ORDER — HEPARIN SODIUM (PORCINE) 5000 UNIT/ML IJ SOLN
5000.0000 [IU] | Freq: Three times a day (TID) | INTRAMUSCULAR | Status: DC
  Administered 2017-04-22 – 2017-04-24 (×7): 5000 [IU] via SUBCUTANEOUS
  Filled 2017-04-22 (×7): qty 1

## 2017-04-22 MED ORDER — MIDAZOLAM HCL 2 MG/2ML IJ SOLN
INTRAMUSCULAR | Status: AC
  Filled 2017-04-22: qty 2

## 2017-04-22 MED ORDER — FENTANYL CITRATE (PF) 100 MCG/2ML IJ SOLN
25.0000 ug | INTRAMUSCULAR | Status: DC | PRN
  Administered 2017-04-22 (×3): 25 ug via INTRAVENOUS
  Filled 2017-04-22 (×3): qty 2

## 2017-04-22 MED ORDER — ETOMIDATE 2 MG/ML IV SOLN
INTRAVENOUS | Status: AC | PRN
  Administered 2017-04-22: 20 mg via INTRAVENOUS

## 2017-04-22 MED ORDER — PANTOPRAZOLE SODIUM 40 MG IV SOLR: 40.0000 mg | Freq: Every day | INTRAVENOUS | Status: DC

## 2017-04-22 MED ORDER — INSULIN ASPART 100 UNIT/ML ~~LOC~~ SOLN
1.0000 [IU] | SUBCUTANEOUS | Status: DC
  Administered 2017-04-22: 2 [IU] via SUBCUTANEOUS

## 2017-04-22 MED ORDER — ALBUTEROL SULFATE (2.5 MG/3ML) 0.083% IN NEBU
2.5000 mg | INHALATION_SOLUTION | RESPIRATORY_TRACT | Status: DC | PRN
  Administered 2017-04-23 – 2017-04-27 (×3): 2.5 mg via RESPIRATORY_TRACT
  Filled 2017-04-22 (×3): qty 3

## 2017-04-22 MED ORDER — PROPOFOL 1000 MG/100ML IV EMUL
5.0000 ug/kg/min | Freq: Once | INTRAVENOUS | Status: AC
  Administered 2017-04-22: 50 ug/kg/min via INTRAVENOUS

## 2017-04-22 MED ORDER — PROPOFOL 1000 MG/100ML IV EMUL
0.0000 ug/kg/min | INTRAVENOUS | Status: DC
  Administered 2017-04-22: 40 ug/kg/min via INTRAVENOUS
  Filled 2017-04-22 (×2): qty 100

## 2017-04-22 MED ORDER — IPRATROPIUM-ALBUTEROL 0.5-2.5 (3) MG/3ML IN SOLN
3.0000 mL | Freq: Four times a day (QID) | RESPIRATORY_TRACT | Status: DC
  Administered 2017-04-22 (×2): 3 mL via RESPIRATORY_TRACT
  Filled 2017-04-22 (×2): qty 3

## 2017-04-22 MED ORDER — ORAL CARE MOUTH RINSE
15.0000 mL | Freq: Two times a day (BID) | OROMUCOSAL | Status: DC
  Administered 2017-04-23 – 2017-04-27 (×7): 15 mL via OROMUCOSAL

## 2017-04-22 MED ORDER — SUCCINYLCHOLINE CHLORIDE 20 MG/ML IJ SOLN
INTRAMUSCULAR | Status: AC | PRN
  Administered 2017-04-22: 10 mg via INTRAVENOUS

## 2017-04-22 MED ORDER — FENTANYL CITRATE (PF) 100 MCG/2ML IJ SOLN
100.0000 ug | INTRAMUSCULAR | Status: AC | PRN
  Administered 2017-04-22 (×3): 100 ug via INTRAVENOUS
  Filled 2017-04-22 (×3): qty 2

## 2017-04-22 MED ORDER — PROPOFOL 10 MG/ML IV BOLUS
INTRAVENOUS | Status: AC | PRN
  Administered 2017-04-22: 3175 ug via INTRAVENOUS

## 2017-04-22 NOTE — ED Provider Notes (Signed)
[facility] DEPT Provider Note   CSN: [id number] Arrival date & time: 04/20/2017  0043     History   Chief Complaint Chief Complaint  Patient presents with  . Respiratory Distress    HPI [name] is a 52 y.o. male.  Level 5 caveat for respiratory distress.  Patient presents via EMS in extremis.  He was apparently called out for shortness of breath was discharged from the hospital today.  History of COPD and lung cancer.  Mental status declined in route to the hospital.  Patient is obtunded and not moving any air on auscultation.  He is unable to give any history.  No reported chest pain.  EMS reports he was speaking to them initially.   he did receive duo nebs, steroids and magnesium.  He is moving 0 air on arrival and obtunded.  He is intubated emergently.  The history is provided by the patient and the EMS personnel. The history is limited by the condition of the patient.    Past Medical History:  Diagnosis Date  . Bronchitis   . CHF (congestive heart failure) ([place])   . Chronic respiratory failure ([place])    home O@ 4L (01/28/14)  . COPD (chronic obstructive pulmonary disease) ([place])   . GERD (gastroesophageal reflux disease)   . Hypertension   . OSA on CPAP    ramps 5 to 10  . Pneumonia   . Sarcoidosis     Patient Active Problem List   Diagnosis Date Noted  . Pneumonia of left lower lobe due to infectious organism ([place])   . Erythema nodosum, acute form 06/20/2016  . Cellulitis 06/20/2016  . Acute respiratory failure with hypoxia ([place]) 04/16/2016  . Sarcoidosis 01/13/2016  . Hypertension 01/13/2016  . Chronic respiratory failure ([place]) 01/13/2016  . Cocaine abuse ([place]) 01/13/2016  . Respiratory distress 01/12/2016  . Epistaxis 09/19/2015  . Hyperkalemia   . COPD exacerbation ([place]) 07/21/2015  . HCAP (healthcare-associated pneumonia) 01/13/2015  . SOB (shortness of breath) 03/23/2014  . Essential hypertension   . Chronic respiratory  failure with hypoxia ([place]) 02/11/2014  . Neuropathy 01/26/2014  . Abnormal bruising 01/23/2014  . Foot pain, bilateral 01/23/2014  . Discoloration of skin of toe 01/23/2014  . Acute on chronic respiratory failure with hypoxia ([place]) 01/17/2014  . Chronic pain 11/23/2013  . Abdominal pain, epigastric 08/03/2012  . Nuclear sclerotic cataract 07/06/2012  . Normocytic anemia 06/16/2012  . GERD (gastroesophageal reflux disease) 06/16/2012  . Bronchiectasis with (acute) exacerbation ([place]) 03/05/2012  . Hyperglycemia 07/27/2011  . Error, refractive, myopia 02/15/2011  . Sarcoid ([place]) 01/15/2011  . OSA on CPAP 01/15/2011    Past Surgical History:  Procedure Laterality Date  . arm surgery     . ROTATOR CUFF REPAIR    . VIDEO ASSISTED THORACOSCOPY (VATS)/THOROCOTOMY Left 07/17/2012   Procedure: VIDEO ASSISTED THORACOSCOPY (VATS)/BLEB [place];  Surgeon: Gaye Pollack, MD;  Location: [facility] OR;  Service: Thoracic;  Laterality: Left;        Home Medications    Prior to Admission medications   Medication Sig Start Date End Date Taking? Authorizing Provider  albuterol (PROVENTIL HFA;VENTOLIN HFA) 108 (90 Base) MCG/ACT inhaler Inhale 2 puffs into the lungs every 6 (six) hours as needed for wheezing or shortness of breath. 04/19/16   Robbie Lis, MD  albuterol (PROVENTIL) (2.5 MG/3ML) 0.083% nebulizer solution USE 1 VIAL IN NEBULIZER EVERY 6 HOURS AS NEEDED FOR WHEEZING 09/02/16   [provider]  calcium-vitamin D (  OSCAL WITH D) 500-200 MG-UNIT tablet Take 1 tablet by mouth daily.    [provider]  cyclobenzaprine (FLEXERIL) 10 MG tablet Take 10 mg by mouth 3 (three) times daily as needed for muscle spasms.     [provider]  diltiazem (CARDIZEM CD) 180 MG 24 hr capsule Take 1 capsule (180 mg total) by mouth daily. 04/21/17   Geradine Girt, DO  diphenhydrAMINE (BENADRYL) 25 MG tablet Take 25 mg by mouth every 6 (six) hours as needed for itching or allergies.     [provider]  Ergocalciferol (VITAMIN D2) 400 units TABS Take 400 Units by mouth daily. 03/18/08   [provider]  ferrous sulfate 325 (65 FE) MG tablet Take 325 mg by mouth 2 (two) times daily with a meal.    [provider]  furosemide (LASIX) 40 MG tablet Take 1 tablet (40 mg total) by mouth daily. 04/21/17   Geradine Girt, DO  ipratropium-albuterol (DUONEB) 0.5-2.5 (3) MG/3ML SOLN Take 3 mLs by nebulization every 4 (four) hours as needed. Patient taking differently: Take 3 mLs by nebulization every 4 (four) hours as needed (SOB, wheezing).  04/19/16   Robbie Lis, MD  Multiple Vitamins-Minerals (MULTIVITAMIN WITH MINERALS) tablet Take 1 tablet by mouth every morning.     [provider]  omeprazole (PRILOSEC) 40 MG capsule Take 40 mg by mouth daily.     [provider]  ondansetron (ZOFRAN) 4 MG tablet Take 1 tablet (4 mg total) by mouth every 8 (eight) hours as needed for nausea or vomiting. 10/13/16   Barton Dubois, MD  OXYGEN Inhale 4 L into the lungs continuous.    [provider]  potassium chloride SA (K-DUR,KLOR-CON) 20 MEQ tablet Take 1 tablet (20 mEq total) by mouth daily. 05/01/2017   Geradine Girt, DO  predniSONE (DELTASONE) 20 MG tablet Take 2 tablets (40 mg total) by mouth daily with breakfast. 04/21/17   Geradine Girt, DO  pregabalin (LYRICA) 200 MG capsule Take 200 mg by mouth 2 (two) times daily.     [provider]  Respiratory Therapy Supplies (FLUTTER) DEVI As directed. 02/21/17   Magdalen Spatz, NP  saline (AYR) GEL Place 1 application into the nose every 4 (four) hours as needed (dryness/irritation).    [provider]  SYMBICORT 160-4.5 MCG/ACT inhaler Inhale 1 puff into the lungs 2 (two) times daily. 04/19/16   Robbie Lis, MD  tamsulosin (FLOMAX) 0.4 MG CAPS capsule Take 1 capsule (0.4 mg total) by mouth daily after supper. 04/21/17   Geradine Girt, DO    Family History Family History    Problem Relation Age of Onset  . Diabetes Father     Social History Social History   Tobacco Use  . Smoking status: Former Smoker    Types: Cigarettes  . Smokeless tobacco: Never Used  Substance Use Topics  . Alcohol use: Yes    Comment: social 3-4 times weekly  . Drug use: Yes    Types: "Crack" cocaine, Cocaine    Comment: lst used 2 weeks ago.     Allergies   Clindamycin/lincomycin   Review of Systems Review of Systems  Unable to perform ROS: Acuity of condition     Physical Exam Updated Vital Signs BP (!) 191/133   Pulse (!) 122   Resp (!) 24   Ht 5' 7" (1.702 m)   Wt 63.5 kg (140 lb)   SpO2 100%   BMI  21.93 kg/m   Physical Exam  Constitutional: He appears well-developed and well-nourished. He appears distressed.  Patient is in extremis, he is unresponsive, he is obtunded, his breathing on his own with minimal air movement.  HENT:  Head: Normocephalic and atraumatic.  Mouth/Throat: Oropharynx is clear and moist. No oropharyngeal exudate.  Eyes: Pupils are equal, round, and reactive to light. Conjunctivae and EOM are normal.  Neck: Normal range of motion. Neck supple.  No meningismus.  Cardiovascular: Normal rate, regular rhythm, normal heart sounds and intact distal pulses.  No murmur heard. Pulmonary/Chest: Breath sounds normal. He is in respiratory distress.  dminished breath sounds Minimal on R No air exchange  Abdominal: Soft. There is no tenderness. There is no rebound and no guarding.  Musculoskeletal: Normal range of motion. He exhibits no edema or tenderness.  Neurological: He is alert. No cranial nerve deficit. He exhibits normal muscle tone. Coordination normal.  Obtunded. Unable to give a history  Skin: Skin is warm.  Psychiatric: He has a normal mood and affect. His behavior is normal.  Nursing note and vitals reviewed.    ED Treatments / Results  Labs (all labs ordered are listed, but only abnormal results are displayed) Labs  Reviewed  CBC WITH DIFFERENTIAL/PLATELET - Abnormal; Notable for the following components:      Result Value   WBC 14.2 (*)    MCH 25.1 (*)    Platelets 483 (*)    Neutro Abs 12.4 (*)    All other components within normal limits  COMPREHENSIVE METABOLIC PANEL - Abnormal; Notable for the following components:   Sodium 133 (*)    Chloride 88 (*)    Glucose, Bld 213 (*)    BUN 22 (*)    All other components within normal limits  TROPONIN I - Abnormal; Notable for the following components:   Troponin I 0.03 (*)    All other components within normal limits  BRAIN NATRIURETIC PEPTIDE - Abnormal; Notable for the following components:   B Natriuretic Peptide 149.8 (*)    All other components within normal limits  URINALYSIS, ROUTINE W REFLEX MICROSCOPIC - Abnormal; Notable for the following components:   Hgb urine dipstick SMALL (*)    Protein, ur 30 (*)    Bacteria, UA RARE (*)    All other components within normal limits  RAPID URINE DRUG SCREEN, HOSP PERFORMED - Abnormal; Notable for the following components:   Opiates POSITIVE (*)    Cocaine POSITIVE (*)    All other components within normal limits  BLOOD GAS, ARTERIAL - Abnormal; Notable for the following components:   pCO2 arterial 55.1 (*)    pO2, Arterial 337 (*)    Bicarbonate 31.3 (*)    Acid-Base Excess 5.0 (*)    All other components within normal limits  TROPONIN I - Abnormal; Notable for the following components:   Troponin I 0.04 (*)    All other components within normal limits  CBC - Abnormal; Notable for the following components:   WBC 14.8 (*)    Hemoglobin 12.3 (*)    MCH 25.4 (*)    Platelets 418 (*)    All other components within normal limits  BASIC METABOLIC PANEL - Abnormal; Notable for the following components:   Chloride 90 (*)    Glucose, Bld 116 (*)    BUN 24 (*)    Anion gap 18 (*)    All other components within normal limits  BLOOD GAS, ARTERIAL - Abnormal; Notable for  the following components:     pH, Arterial 7.472 (*)    pO2, Arterial 142 (*)    Bicarbonate 29.1 (*)    Acid-Base Excess 5.3 (*)    All other components within normal limits  I-STAT CG4 LACTIC ACID, ED - Abnormal; Notable for the following components:   Lactic Acid, Venous 3.23 (*)    All other components within normal limits  I-STAT CHEM 8, ED - Abnormal; Notable for the following components:   Sodium 131 (*)    Chloride 89 (*)    BUN 23 (*)    Glucose, Bld 217 (*)    Calcium, Ion 1.13 (*)    TCO2 36 (*)    All other components within normal limits  MRSA PCR SCREENING  PROTIME-INR  MAGNESIUM  PHOSPHORUS  TROPONIN I  TROPONIN I  HIV ANTIBODY (ROUTINE TESTING)  TRIGLYCERIDES  PROCALCITONIN  I-STAT CG4 LACTIC ACID, ED    EKG EKG Interpretation  Date/Time:  Friday April 22 2017 01:02:04 EDT Ventricular Rate:  125 PR Interval:    QRS Duration: 85 QT Interval:  314 QTC Calculation: 453 R Axis:   101 Text Interpretation:  Sinus tachycardia Consider right atrial enlargement ST elev, probable normal early repol pattern Baseline wander in lead(s) V1 No significant change was found Confirmed by Ezequiel Essex [phone]) on 04/18/2017 1:42:44 AM   Radiology Dg Abdomen 1 View  Result Date: 04/17/2017 CLINICAL DATA:  OG tube placement. EXAM: ABDOMEN - 1 VIEW COMPARISON:  Radiograph 03/24/2014 FINDINGS: Tip and side port of the enteric tube below the diaphragm in the stomach. No bowel dilatation to suggest obstruction. Right basilar pneumothorax is seen on concurrent chest radiograph. IMPRESSION: Tip and side port of the enteric tube below the diaphragm in the stomach. Electronically Signed   By: Jeb Levering M.D.   On: 05/03/2017 01:59   Dg Chest Portable 1 View  Result Date: 05/10/2017 CLINICAL DATA:  Advancement of chest tube. EXAM: PORTABLE CHEST 1 VIEW COMPARISON:  0149 hours on the same day FINDINGS: Advancement of the right-sided chest tube is noted with side port now seen just inside the inner  aspect of the right lateral sixth rib. The small pneumothorax is no longer apparent along the periphery of the right upper lobe. Bilateral parenchymal opacities remain unchanged. A gastric tube extends below the left hemidiaphragm with tip coiled back up towards the gastric fundus. This is similar in appearance. Endotracheal tube tip is 1.5 cm above the carina. An additional linear density projects over the left mainstem bronchus of uncertain etiology. This could be external to the patient. Left mainstem endobronchial intubation is believed less likely. Parenchymal changes of the lung remain stable. IMPRESSION: 1. Further advancement right-sided chest tube has been performed. The small pneumothorax is no longer apparent. Side port of the chest to is seen just medial to the right lateral sixth rib. 2. What appears to be the tip of an endotracheal tube is 1.5 cm above the carina on current study versus 2.6 cm previously. There is a linear density that projects over the left mainstem bronchus of uncertain etiology possibly external to the patient. This was discussed with Dr. Wyvonnia Dusky prior to finalization this report to further correlate clinically. This could potentially be external to the patient possibly an external cardiac monitoring device or lead. Electronically Signed   By: Ashley Royalty M.D.   On: 04/12/2017 03:29   Dg Chest Portable 1 View  Result Date: 04/15/2017 CLINICAL DATA:  Initial evaluation status post  chest tube insertion. EXAM: PORTABLE CHEST 1 VIEW COMPARISON:  Prior radiograph from earlier the same day. FINDINGS: Patient remains intubated with the tip of the endotracheal tube well positioned 2.6 cm above the carina. Enteric tube courses in the abdomen. Mild cardiomegaly is unchanged. Mediastinal silhouette within normal limits. There has been interval placement of a large-bore chest tube with tip overlying the right mid lung, side hole outside the confines of the bony right hemithorax. Interval  re-expansion of previously seen moderate right pneumothorax, with a small basilar and lateral components now seen. Bilateral pulmonary scarring again noted, consistent with history of sarcoidosis. Left mid and lower lung opacities are slightly worsened, suspected to reflect progressive atelectasis and/or edema, although infiltrate could be considered in the correct clinical setting. Degree of underlying pulmonary vascular congestion noted. Small bilateral pleural effusions. Suture material overlies the left lung apex. Osseous structures are unchanged. IMPRESSION: 1. Interval placement of right-sided chest tube with tip overlying the right midlung. Side hole outside the bony confines of the right hemithorax. Previously seen right pneumothorax markedly improved, with small right basilar and lateral components now visualized. 2. Remaining support apparatus in satisfactory position. 3. Bilateral pulmonary scarring, consistent with history of sarcoidosis. Left mid and lower lung opacities are slightly worsen, which may reflect progressive atelectasis and/or edema. Superimposed infiltrate could be considered in the correct clinical setting. 4. Mild underlying pulmonary vascular congestion with small bilateral pleural effusions. Electronically Signed   By: Jeannine Boga M.D.   On: 04/11/2017 02:59   Dg Chest Portable 1 View  Result Date: 04/20/2017 CLINICAL DATA:  Post intubation. EXAM: PORTABLE CHEST 1 VIEW COMPARISON:  04/18/2017 FINDINGS: Moderate right pneumothorax, approximately 30%. There is mild right to left midline shift. Endotracheal tube is 3.1 cm from the carina. Enteric tube in place with tip and side-port below the diaphragm. Normal heart size. Extensive bilateral perihilar scarring. Chronic blunting of left costophrenic angle. IMPRESSION: 1. Moderate right pneumothorax approximately 30% with mild right to left midline shift. 2. Endotracheal tube 3.1 cm from the carina.  Enteric tube in place. 3.  Bilateral lung scarring consistent with history of sarcoidosis. Critical Value/emergent results were called by telephone at the time of interpretation on 04/23/2017 at 1:57 am to Dr. Ezequiel Essex , who verbally acknowledged these results. Electronically Signed   By: Jeb Levering M.D.   On: 05/10/2017 01:58    Procedures Procedure Name: Intubation Date/Time: 05/10/2017 1:15 AM Performed by: Ezequiel Essex, MD Pre-anesthesia Checklist: Patient identified, Emergency Drugs available, Timeout performed and Patient being monitored Oxygen Delivery Method: Non-rebreather mask Preoxygenation: Pre-oxygenation with 100% oxygen Induction Type: Rapid sequence and IV induction Ventilation: Mask ventilation without difficulty Laryngoscope Size: Mac, Glidescope and 3 Grade View: Grade I Tube size: 7.5 mm Number of attempts: 1 Airway Equipment and Method: Video-laryngoscopy Placement Confirmation: ETT inserted through vocal cords under direct vision,  Breath sounds checked- equal and bilateral and Positive ETCO2 Secured at: 24 cm Dental Injury: Teeth and Oropharynx as per pre-operative assessment  Difficulty Due To: Difficulty was anticipated Future Recommendations: Recommend- induction with short-acting agent, and alternative techniques readily available     CHEST TUBE INSERTION Date/Time: 04/29/2017 2:02 AM Performed by: Ezequiel Essex, MD Authorized by: Ezequiel Essex, MD   Consent:    Consent obtained:  Emergent situation and verbal   Consent given by:  Spouse   Risks discussed:  Bleeding, incomplete drainage, infection, pain and nerve damage Pre-procedure details:    Skin preparation:  ChloraPrep   Preparation: Patient  was prepped and draped in the usual sterile fashion   Sedation:    Sedation type:  Moderate (conscious) sedation and deep Anesthesia (see MAR for exact dosages):    Anesthesia method:  Local infiltration   Local anesthetic:  Lidocaine 1% w/o epi Procedure  details:    Placement location:  R lateral   Scalpel size:  11   Tube size (Fr):  28   Dissection instrument:  Kelly clamp and finger   Ultrasound guidance: no     Tension pneumothorax: yes     Tube connected to:  Water seal   Drainage characteristics:  Air only   Suture material:  0 silk   Dressing:  4x4 sterile gauze and petrolatum-impregnated gauze Post-procedure details:    Post-insertion x-ray findings: tube in good position     Patient tolerance of procedure:  Tolerated well, no immediate complications   (including critical care time)  Medications Ordered in ED Medications  propofol (DIPRIVAN) 1000 MG/100ML infusion (has no administration in time range)  lidocaine (XYLOCAINE) 2 % (with pres) injection (has no administration in time range)  midazolam (VERSED) 2 MG/2ML injection (has no administration in time range)  heparin injection 5,000 Units (5,000 Units Subcutaneous Given 05/02/2017 0523)  pantoprazole (PROTONIX) injection 40 mg (has no administration in time range)  insulin aspart (novoLOG) injection 1-3 Units (has no administration in time range)  fentaNYL (SUBLIMAZE) injection 100 mcg (100 mcg Intravenous Given 04/25/2017 0523)  fentaNYL (SUBLIMAZE) injection 100 mcg (has no administration in time range)  propofol (DIPRIVAN) 1000 MG/100ML infusion (has no administration in time range)  docusate (COLACE) 50 MG/5ML liquid 100 mg (has no administration in time range)  bisacodyl (DULCOLAX) suppository 10 mg (has no administration in time range)  propofol (DIPRIVAN) 1000 MG/100ML infusion (50 mcg/kg/min  63.5 kg Intravenous New Bag/Given 05/09/2017 0200)  sodium chloride 0.9 % bolus 1,000 mL (1,000 mLs Intravenous New Bag/Given 05/09/2017 0200)  propofol (DIPRIVAN) 10 mg/mL bolus/IV push (3,175 mcg Intravenous Given 04/26/2017 0059)  midazolam (VERSED) 5 MG/5ML injection (1 mg Intravenous Given 04/17/2017 0125)  etomidate (AMIDATE) injection (20 mg Intravenous Given 04/21/2017 0059)    succinylcholine (ANECTINE) injection (10 mg Intravenous Given 05/06/2017 0045)     Initial Impression / Assessment and Plan / ED Course  I have reviewed the triage vital signs and the nursing notes.  Pertinent labs & imaging results that were available during my care of the patient were reviewed by me and considered in my medical decision making (see chart for details).     patient discharged from hospital today after COPD exacerbation.  Found to be in extremis with minimal air movement.  He is intubated emergently.  X-ray shows right-sided pneumothorax without tension component.  \Chest tube placed emergently.  Patient's wife at bedside updated and consents to procedure.  Chest tube placed with resolution of previously seen pneumothorax.  Patient tolerating ventilator well.  He was sedated with propofol.  Wife is arrived and admits that somebody picked up the patient from his discharge from [facility] and made to stop somewhere and patient was having trouble breathing by the time he got home.  Is likely he is cocaine again.  We will continue bronchodilators, steroids and magnesium.  Admit to ICU.  Discussed with Dr. Chase Caller.   CRITICAL CARE Performed by: Ezequiel Essex Total critical care time: 60 minutes Critical care time was exclusive of separately billable procedures and treating other patients. Critical care was necessary to treat or prevent imminent or life-threatening  deterioration. Critical care was time spent personally by me on the following activities: development of treatment plan with patient and/or surrogate as well as nursing, discussions with consultants, evaluation of patient's response to treatment, examination of patient, obtaining history from patient or surrogate, ordering and performing treatments and interventions, ordering and review of laboratory studies, ordering and review of radiographic studies, pulse oximetry and re-evaluation of patient's  condition.     Final Clinical Impressions(s) / ED Diagnoses   Final diagnoses:  Acute respiratory failure with hypoxia [facility])  Pneumothorax on right    ED Discharge Orders    None       , Annie Main, MD 04/28/2017 [phone]

## 2017-04-22 NOTE — ED Triage Notes (Signed)
Pt presents by EMS for respiratory distress. Pt was given 2grams Magnesium, 125MG  Solu-Medrol, 10mg  Albuterol, 1mg  Atrovent during transport and placed on C-PAP. EMS reported diminished lung sounds throughout.

## 2017-04-22 NOTE — H&P (Signed)
.. ..  Name: [name] MRN: [id number] DOB: 01/14/1965    ADMISSION DATE:  2017/07/20 CONSULTATION DATE:  2018-01-11  REFERRING MD :  Manus GunningANCOUR MD  CHIEF COMPLAINT:  SOB  BRIEF PATIENT DESCRIPTION: 52 yr old male with PMHx of obstructive pulmonary disease recently discharged on 04/21/17 presents on 4/12 s/p cocaine use with dyspnea andchest pain now intubatedon mechanical ventilation s/p R chest tube for pneumothorax  SIGNIFICANT EVENTS  Intubation Chest tube placement  STUDIES:  CXR ABG   HISTORY OF PRESENT ILLNESS: (History obtained as per the accounts of other providers and wife) 52 year old male with past medical history of obstructive pulmonary disease sarcoid, OSA on CPAP, CHF with diastolic dysfunction, recently discharged on 04/21/2017. Pt presents on 2017/07/20 with shortness of breath that started at home per his wife.  Per her account the patient was picked up from the hospital by a friend prior to coming home he made a stop by the time he got to the house he was short of breath she attempted to give him his inhalers and other medication it did not help, and the patient asked for her to call EMS. Per ED physician's documentation EMS stated that the patient was initially speaking to them received duo nebs, steroids, magnesium.  By the time he arrived to the ED he was "moving 0 air"and was intubated emergently.  "x-ray showed a right-sided pneumothorax without a tension component and a chest tube was placed emergently."  PCCM was consulted for admission.  On my evaluation wife at bedside confirms pt used cocaine, she stated that he used no other substances and that "he did not smoke crack."    PAST MEDICAL HISTORY :   has a past medical history of Bronchitis, CHF (congestive heart failure) (HCC), Chronic respiratory failure (HCC), COPD (chronic obstructive pulmonary disease) (HCC), GERD (gastroesophageal reflux disease), Hypertension, OSA on CPAP, Pneumonia, and Sarcoidosis.  has a  past surgical history that includes Rotator cuff repair; arm surgery ; and Video assisted thoracoscopy (vats)/thorocotomy (Left, 07/17/2012). Prior to Admission medications   Medication Sig Start Date End Date Taking? Authorizing Provider  albuterol (PROVENTIL HFA;VENTOLIN HFA) 108 (90 Base) MCG/ACT inhaler Inhale 2 puffs into the lungs every 6 (six) hours as needed for wheezing or shortness of breath. 04/19/16   Alison Murrayevine, Alma M, MD  albuterol (PROVENTIL) (2.5 MG/3ML) 0.083% nebulizer solution USE 1 VIAL IN NEBULIZER EVERY 6 HOURS AS NEEDED FOR WHEEZING 09/02/16   [provider]  calcium-vitamin D (OSCAL WITH D) 500-200 MG-UNIT tablet Take 1 tablet by mouth daily.    [provider]  cyclobenzaprine (FLEXERIL) 10 MG tablet Take 10 mg by mouth 3 (three) times daily as needed for muscle spasms.     [provider]  diltiazem (CARDIZEM CD) 180 MG 24 hr capsule Take 1 capsule (180 mg total) by mouth daily. 04/21/17   Joseph ArtVann, Jessica U, DO  diphenhydrAMINE (BENADRYL) 25 MG tablet Take 25 mg by mouth every 6 (six) hours as needed for itching or allergies.    [provider]  Ergocalciferol (VITAMIN D2) 400 units TABS Take 400 Units by mouth daily. 03/18/08   [provider]  ferrous sulfate 325 (65 FE) MG tablet Take 325 mg by mouth 2 (two) times daily with a meal.    [provider]  furosemide (LASIX) 40 MG tablet Take 1 tablet (40 mg total) by mouth daily. 04/21/17   Joseph ArtVann, Jessica U, DO  ipratropium-albuterol (DUONEB) 0.5-2.5 (3) MG/3ML SOLN Take 3 mLs by  nebulization every 4 (four) hours as needed. Patient taking differently: Take 3 mLs by nebulization every 4 (four) hours as needed (SOB, wheezing).  04/19/16   Alison Murray, MD  Multiple Vitamins-Minerals (MULTIVITAMIN WITH MINERALS) tablet Take 1 tablet by mouth every morning.     [provider]  omeprazole (PRILOSEC) 40 MG capsule Take 40 mg by mouth daily.     [provider]  ondansetron  (ZOFRAN) 4 MG tablet Take 1 tablet (4 mg total) by mouth every 8 (eight) hours as needed for nausea or vomiting. 10/13/16   Vassie Loll, MD  OXYGEN Inhale 4 L into the lungs continuous.    [provider]  potassium chloride SA (K-DUR,KLOR-CON) 20 MEQ tablet Take 1 tablet (20 mEq total) by mouth daily. May 17, 2017   Joseph Art, DO  predniSONE (DELTASONE) 20 MG tablet Take 2 tablets (40 mg total) by mouth daily with breakfast. 04/21/17   Joseph Art, DO  pregabalin (LYRICA) 200 MG capsule Take 200 mg by mouth 2 (two) times daily.     [provider]  Respiratory Therapy Supplies (FLUTTER) DEVI As directed. 02/21/17   Bevelyn Ngo, NP  saline (AYR) GEL Place 1 application into the nose every 4 (four) hours as needed (dryness/irritation).    [provider]  SYMBICORT 160-4.5 MCG/ACT inhaler Inhale 1 puff into the lungs 2 (two) times daily. 04/19/16   Alison Murray, MD  tamsulosin (FLOMAX) 0.4 MG CAPS capsule Take 1 capsule (0.4 mg total) by mouth daily after supper. 04/21/17   Joseph Art, DO   Allergies  Allergen Reactions  . Clindamycin/Lincomycin Anaphylaxis and Swelling    Made face and tongue swell    FAMILY HISTORY:  family history includes Diabetes in his father. SOCIAL HISTORY:  reports that he has quit smoking. His smoking use included cigarettes. He has never used smokeless tobacco. He reports that he drinks alcohol. He reports that he has current or past drug history. Drugs: "Crack" cocaine and Cocaine.  REVIEW OF SYSTEMS:  ( unable to obtain due to encephalopthy, pt on propofol and intubated) Constitutional: Negative for fever, chills, weight loss, malaise/fatigue and diaphoresis.  HENT: Negative for hearing loss, ear pain, nosebleeds, congestion, sore throat, neck pain, tinnitus and ear discharge.   Eyes: Negative for blurred vision, double vision, photophobia, pain, discharge and redness.  Respiratory: Negative for cough, hemoptysis, sputum  production, shortness of breath, wheezing and stridor.   Cardiovascular: Negative for chest pain, palpitations, orthopnea, claudication, leg swelling and PND.  Gastrointestinal: Negative for heartburn, nausea, vomiting, abdominal pain, diarrhea, constipation, blood in stool and melena.  Genitourinary: Negative for dysuria, urgency, frequency, hematuria and flank pain.  Musculoskeletal: Negative for myalgias, back pain, joint pain and falls.  Skin: Negative for itching and rash.  Neurological: Negative for dizziness, tingling, tremors, sensory change, speech change, focal weakness, seizures, loss of consciousness, weakness and headaches.  Endo/Heme/Allergies: Negative for environmental allergies and polydipsia. Does not bruise/bleed easily.  SUBJECTIVE:   VITAL SIGNS: Temp:  [97.5 F (36.4 C)-97.8 F (36.6 C)] 97.5 F (36.4 C) (04/12 0121) Pulse Rate:  [96-129] 105 (04/12 0200) Resp:  [17-24] 21 (04/12 0215) BP: (102-191)/(77-133) 111/90 (04/12 0215) SpO2:  [99 %-100 %] 100 % (04/12 0200) FiO2 (%):  [50 %-100 %] 50 % (04/12 0134) Weight:  [62.2 kg (137 lb 3.2 oz)-63.5 kg (140 lb)] 63.5 kg (140 lb) (04/12 0053)  PHYSICAL EXAMINATION: General:  In no acute distress Neuro:  GCS 9, PERRLA (  arc senile) HEENT:  NCAT ETT in oropharynx Cardiovascular:  S1 and S2 appreciated no discernible murmur or rub Lungs:  B/l air entry, slight exp wheeze Abdomen:  Soft, non tender, + BS Musculoskeletal:  No gross deformities, no lower ext edema Skin:  Grossly intact  Recent Labs  Lab 04/20/17 0424 04/21/17 0531 05-01-17 0057 05/01/2017 0127  NA 137 136 133* 131*  K 4.1 4.3 5.0 4.9  CL 94* 92* 88* 89*  CO2 32 34* 32  --   BUN 13 16 22* 23*  CREATININE 0.86 0.98 0.95 0.90  GLUCOSE 123* 122* 213* 217*   Recent Labs  Lab 04/20/17 0424 04/21/17 0531 May 01, 2017 0057 May 01, 2017 0127  HGB 10.8* 12.0* 13.5 15.6  HCT 36.0* 39.6 43.5 46.0  WBC 13.9* 11.7* 14.2*  --   PLT 404* 421* 483*  --    Dg  Abdomen 1 View  Result Date: 01-May-2017 CLINICAL DATA:  OG tube placement. EXAM: ABDOMEN - 1 VIEW COMPARISON:  Radiograph 03/24/2014 FINDINGS: Tip and side port of the enteric tube below the diaphragm in the stomach. No bowel dilatation to suggest obstruction. Right basilar pneumothorax is seen on concurrent chest radiograph. IMPRESSION: Tip and side port of the enteric tube below the diaphragm in the stomach. Electronically Signed   By: Rubye Oaks M.D.   On: 05-01-17 01:59   Dg Chest Portable 1 View  Result Date: 05/01/17 CLINICAL DATA:  Post intubation. EXAM: PORTABLE CHEST 1 VIEW COMPARISON:  04/18/2017 FINDINGS: Moderate right pneumothorax, approximately 30%. There is mild right to left midline shift. Endotracheal tube is 3.1 cm from the carina. Enteric tube in place with tip and side-port below the diaphragm. Normal heart size. Extensive bilateral perihilar scarring. Chronic blunting of left costophrenic angle. IMPRESSION: 1. Moderate right pneumothorax approximately 30% with mild right to left midline shift. 2. Endotracheal tube 3.1 cm from the carina.  Enteric tube in place. 3. Bilateral lung scarring consistent with history of sarcoidosis. Critical Value/emergent results were called by telephone at the time of interpretation on May 01, 2017 at 1:57 am to Dr. Glynn Octave , who verbally acknowledged these results. Electronically Signed   By: Rubye Oaks M.D.   On: 01-May-2017 01:58    ASSESSMENT / PLAN: NEURO: Acute encephalopathy GCS 9 RASS 0 to -1 Continues on Propofol ggt (check TGs)   CARDIAC: HFpEF Hemodynamically stable  Last EF 65-70% Last ECHO on 04/18/17 showed severe chronic cor pulmonale, possibly with acute decompensation .Marland KitchenCardiac Panel (last 3 results) Recent Labs    05-01-17 0057  TROPONINI 0.03*  Wife denies any IVDA   PULMONARY: Acute hypoxic respiratory failure  Concern for a Right sided pneumothorax and pt received chest tube Has a prior history  of COPD and Sarcoidosis Follows with Byrum MD recently discharged on 04/21/2017  +cocaine use Reviewed CXRs and old CT scans Pt had significant blebbing  ? Chest tube is placed in a previous bleb On my evaluation there is bloody output from chest tube Needs CT chest to further evaluate  ID: Previously on Doxycycline Leukocytosis 14 and LA 3.2 Trend WBC, LA and fever curve No Antibiotics for now  Endocrine: H/o Hyperglycemia  Current BG 200s Start on SSI-> Goal BG 140-180mg /dl Get [id number]   GI: Has a H/o GERD Currently intubated PPI forprophylaxis NPO for now  Heme Some bloody output from chest tube No other signs of bleeding If Hgb<8 transfuse PRBCs ..O POS DVT PPx-> Hep Parker and SCDs  RENAL Baseline Cr No evidence of acute  kidney injury Lab Results  Component Value Date   CREATININE 0.90 04-24-17   CREATININE 0.95 04-24-17   CREATININE 0.98 04/21/2017  Condom cath Monitor Is and Os Electrolyte goals: K+ 4 Mg 2 and Phos 2  ..   I, Dr Newell Coral have personally reviewed patient's available data, including medical history, events of note, physical examination and test results as part of my evaluation. I have discussed with NP Lodema Hong and other care providers such as pharmacist, RN and RRT. The patient is critically ill with multiple organ systems failure and requires high complexity decision making for assessment and support, frequent evaluation and titration of therapies, application of advanced monitoring technologies and extensive interpretation of multiple databases. Critical Care Time devoted to patient care services described in this note is >30 Minutes. This time reflects time of care of this signee Dr Newell Coral. This critical care time does not reflect procedure time, or teaching time or supervisory time of PA/NP/Med student/Med Resident etc but could involve care discussion time    DISPOSITION: ICU CC TIME: 55  MINS PROGNOSIS:GUARDED FAMILY:WIFE AT BEDSIDE CODE STATUS: FULL   Signed Dr Newell Coral Pulmonary Critical Care Locums   24-Apr-2017, 2:59 AM

## 2017-04-22 NOTE — ED Notes (Signed)
I gave critical I Stat CG4 results to MD Rancour

## 2017-04-22 NOTE — CV Procedure (Signed)
2D echo attempted but patient refused at this time

## 2017-04-22 NOTE — Progress Notes (Signed)
Patient refusing to start with ice chips after being extubated from ventilator. Patient stating wanting "ice cream". Explained to the patient that right after extubation, the patient is high risk for aspiration. If patient is able to tolerate ice chips, then we can advance the diet as well. Patient also refused his Echo at this time.

## 2017-04-22 NOTE — ED Notes (Signed)
Bed: RESA Expected date:  Expected time:  Means of arrival:  Comments: Respiratory Distress 

## 2017-04-22 NOTE — Procedures (Signed)
Extubation Procedure Note  Patient Details:   Name: [name] DOB: 07/14/1965 MRN: [id number]   Airway Documentation:     Evaluation  O2 sats: stable throughout Complications: No apparent complications Patient did tolerate procedure well. Bilateral Breath Sounds: Clear, Diminished   Yes  Suzan GaribaldiCraddock, Srah Ake Ann 22-Oct-2017, 2:10 PM

## 2017-04-22 NOTE — Progress Notes (Addendum)
..  Name: [name] MRN: [id number] DOB: [birth year]    ADMISSION DATE:  05/02/17 CONSULTATION DATE:  05/02/17  REFERRING MD :  Manus Gunning MD  CHIEF COMPLAINT:  SOB  BRIEF SUMMARY: 51 yr old male, smoker, chronic cocaine/opiate abuse, with PMH of severe obstructive lung disease, burnt out sarcoid with large bullae, OSA on CPAP, chronic diastolic CHF, recently discharged on 04/21/17 presents on 4/12 s/p cocaine use with dyspnea and chest pain now intubated on mechanical ventilation s/p R chest tube for pneumothorax.    SUBJECTIVE:  Pt remains sedate on propofol, on vent.  Wakes easily to stimulation.   VITAL SIGNS: Temp:  [97.5 F (36.4 C)-98.7 F (37.1 C)] 98.3 F (36.8 C) (04/12 0815) Pulse Rate:  [94-129] 100 (04/12 0800) Resp:  [17-24] 24 (04/12 1000) BP: (97-191)/(69-133) 107/72 (04/12 1000) SpO2:  [94 %-100 %] 98 % (04/12 1000) FiO2 (%):  [30 %-100 %] 40 % (04/12 1124) Weight:  [137 lb 9.1 oz (62.4 kg)-140 lb (63.5 kg)] 137 lb 9.1 oz (62.4 kg) (04/12 0507)  PHYSICAL EXAMINATION: General: chronically ill appearing male in NAD   HEENT: MM pink/moist Neuro: sedate, no distress CV: s1s2 rrr, no m/r/g PULM: even/non-labored, lungs bilaterally distant but clear, R chest tube with variable air leak 1-5/7 air leak  NW:GNFA, non-tender, bsx4 active  Extremities: warm/dry, no edema  Skin: no rashes or lesions  Recent Labs  Lab 04/21/17 0531 2017/05/02 0057 2017-05-02 0127 May 02, 2017 0513  NA 136 133* 131* 136  K 4.3 5.0 4.9 3.9  CL 92* 88* 89* 90*  CO2 34* 32  --  28  BUN 16 22* 23* 24*  CREATININE 0.98 0.95 0.90 1.09  GLUCOSE 122* 213* 217* 116*   Recent Labs  Lab 04/21/17 0531 05-02-17 0057 02-May-2017 0127 2017/05/02 0513  HGB 12.0* 13.5 15.6 12.3*  HCT 39.6 43.5 46.0 39.6  WBC 11.7* 14.2*  --  14.8*  PLT 421* 483*  --  418*   Dg Abdomen 1 View  Result Date: 05/02/17 CLINICAL DATA:  OG tube placement. EXAM: ABDOMEN - 1 VIEW COMPARISON:  Radiograph 03/24/2014  FINDINGS: Tip and side port of the enteric tube below the diaphragm in the stomach. No bowel dilatation to suggest obstruction. Right basilar pneumothorax is seen on concurrent chest radiograph. IMPRESSION: Tip and side port of the enteric tube below the diaphragm in the stomach. Electronically Signed   By: Rubye Oaks M.D.   On: 05-02-17 01:59   Ct Head Wo Contrast  Result Date: 02-May-2017 CLINICAL DATA:  52 year old male with cough and respiratory distress. Thoracic sarcoidosis. EXAM: CT HEAD WITHOUT CONTRAST TECHNIQUE: Contiguous axial images were obtained from the base of the skull through the vertex without intravenous contrast. COMPARISON:  Neck CT 01/15/2009. FINDINGS: Brain: No midline shift, ventriculomegaly, mass effect, evidence of mass lesion, intracranial hemorrhage or evidence of cortically based acute infarction. Gray-white matter differentiation is within normal limits throughout the brain. Vascular: No suspicious intracranial vascular hyperdensity. Skull: Osteopenia. Chronic right lamina papyracea fracture. Otherwise negative. Sinuses/Orbits: Bubbly opacity in the left maxillary sinus. Otherwise visible paranasal sinuses and mastoids are well pneumatized. Other: Endotracheal tube and oral enteric tubes partially visible on the scout view. Small volume fluid in the nasopharynx. Visualized orbit soft tissues are within normal limits. Visualized scalp soft tissues are within normal limits. IMPRESSION: 1.  Normal noncontrast CT appearance of the brain. 2. Intubated. Mild fluid in the nasopharynx and left maxillary sinus. Electronically Signed   By: Althea Grimmer.D.  On: 04/25/2017 11:17   Ct Chest Wo Contrast  Result Date: 04/30/2017 CLINICAL DATA:  Shortness of breath/respiratory distress, emergently intubated EXAM: CT CHEST WITHOUT CONTRAST TECHNIQUE: Multidetector CT imaging of the chest was performed following the standard protocol without IV contrast. COMPARISON:  Chest radiograph dated  04/20/2017. CT chest dated 04/13/2017. FINDINGS: Cardiovascular: Heart is normal in size. Trace inferior pericardial fluid. No evidence of thoracic aortic aneurysm. Mediastinum/Nodes: Calcified mediastinal and bilateral hilar lymphadenopathy, unchanged, likely related to the patient's history of sarcoidosis. Visualized thyroid is unremarkable. Lungs/Pleura: Endotracheal tube terminates 3 cm above the carina. Upper lobe predominant peribronchovascular nodularity with interstitial thickening and upward hilar retraction, with additional peribronchovascular nodularity extending into the left lower lobe, unchanged from prior CT and likely related to the patient's known sarcoidosis. Small right pneumothorax, anterior lower lung predominant (series 7/image 103). This is new from prior CT but was noted on interval chest radiographs. Indwelling right chest tube anteriorly in the right mid hemithorax (series 7/image 63). Thin-walled cavitary lesion/bulla in the anterior right upper lobe measuring 10.7 x 7.3 cm (series 7/image 41). This is mildly increased from prior CT, but was previously more thick-walled. Additional paraseptal emphysematous changes, upper lobe predominant. No pleural effusion. Upper Abdomen: Visualized upper abdomen is notable for an enteric tube looped in the gastric cardia. Musculoskeletal: Visualized osseous structures are within normal limits. IMPRESSION: Endotracheal tube terminates 3 cm above the carina. Small right pneumothorax, new from prior CT, although noted on interval chest radiographs. Indwelling chest tube. Stable findings of sarcoidosis with superimposed emphysema, as described above. Emphysema (ICD10-J43.9). Electronically Signed   By: Charline Bills M.D.   On: 04/27/2017 11:42   Dg Chest Portable 1 View  Result Date: 04/30/2017 CLINICAL DATA:  Advancement of chest tube. EXAM: PORTABLE CHEST 1 VIEW COMPARISON:  0149 hours on the same day FINDINGS: Advancement of the right-sided chest  tube is noted with side port now seen just inside the inner aspect of the right lateral sixth rib. The small pneumothorax is no longer apparent along the periphery of the right upper lobe. Bilateral parenchymal opacities remain unchanged. A gastric tube extends below the left hemidiaphragm with tip coiled back up towards the gastric fundus. This is similar in appearance. Endotracheal tube tip is 1.5 cm above the carina. An additional linear density projects over the left mainstem bronchus of uncertain etiology. This could be external to the patient. Left mainstem endobronchial intubation is believed less likely. Parenchymal changes of the lung remain stable. IMPRESSION: 1. Further advancement right-sided chest tube has been performed. The small pneumothorax is no longer apparent. Side port of the chest to is seen just medial to the right lateral sixth rib. 2. What appears to be the tip of an endotracheal tube is 1.5 cm above the carina on current study versus 2.6 cm previously. There is a linear density that projects over the left mainstem bronchus of uncertain etiology possibly external to the patient. This was discussed with Dr. Manus Gunning prior to finalization this report to further correlate clinically. This could potentially be external to the patient possibly an external cardiac monitoring device or lead. Electronically Signed   By: Tollie Eth M.D.   On: 05/04/2017 03:29   Dg Chest Portable 1 View  Result Date: 04/25/2017 CLINICAL DATA:  Initial evaluation status post chest tube insertion. EXAM: PORTABLE CHEST 1 VIEW COMPARISON:  Prior radiograph from earlier the same day. FINDINGS: Patient remains intubated with the tip of the endotracheal tube well positioned 2.6 cm above  the carina. Enteric tube courses in the abdomen. Mild cardiomegaly is unchanged. Mediastinal silhouette within normal limits. There has been interval placement of a large-bore chest tube with tip overlying the right mid lung, side hole  outside the confines of the bony right hemithorax. Interval re-expansion of previously seen moderate right pneumothorax, with a small basilar and lateral components now seen. Bilateral pulmonary scarring again noted, consistent with history of sarcoidosis. Left mid and lower lung opacities are slightly worsened, suspected to reflect progressive atelectasis and/or edema, although infiltrate could be considered in the correct clinical setting. Degree of underlying pulmonary vascular congestion noted. Small bilateral pleural effusions. Suture material overlies the left lung apex. Osseous structures are unchanged. IMPRESSION: 1. Interval placement of right-sided chest tube with tip overlying the right midlung. Side hole outside the bony confines of the right hemithorax. Previously seen right pneumothorax markedly improved, with small right basilar and lateral components now visualized. 2. Remaining support apparatus in satisfactory position. 3. Bilateral pulmonary scarring, consistent with history of sarcoidosis. Left mid and lower lung opacities are slightly worsen, which may reflect progressive atelectasis and/or edema. Superimposed infiltrate could be considered in the correct clinical setting. 4. Mild underlying pulmonary vascular congestion with small bilateral pleural effusions. Electronically Signed   By: Rise MuBenjamin  McClintock M.D.   On: 09-28-2017 02:59   Dg Chest Portable 1 View  Result Date: 03-07-2017 CLINICAL DATA:  Post intubation. EXAM: PORTABLE CHEST 1 VIEW COMPARISON:  04/18/2017 FINDINGS: Moderate right pneumothorax, approximately 30%. There is mild right to left midline shift. Endotracheal tube is 3.1 cm from the carina. Enteric tube in place with tip and side-port below the diaphragm. Normal heart size. Extensive bilateral perihilar scarring. Chronic blunting of left costophrenic angle. IMPRESSION: 1. Moderate right pneumothorax approximately 30% with mild right to left midline shift. 2.  Endotracheal tube 3.1 cm from the carina.  Enteric tube in place. 3. Bilateral lung scarring consistent with history of sarcoidosis. Critical Value/emergent results were called by telephone at the time of interpretation on 03-07-2017 at 1:57 am to Dr. Glynn OctaveSTEPHEN RANCOUR , who verbally acknowledged these results. Electronically Signed   By: Rubye OaksMelanie  Ehinger M.D.   On: 09-28-2017 01:58   STUDIES:  CT Chest 4/12 >> ETT 3 cm above carina, small R pneumothorax, indwelling chest tube, stable findings of sarcoidosis with superimposed emphysema CT Head 4/12 >> negative  CULTURES:   ANTIBIOTICS:   SIGNIFICANT EVENTS: 4/12  Admit   LINES/TUBES: R Chest Tube 4/12 >>  ETT 4/12 >>   DISCUSSION: 52 y/o M with severe bullous emphysema, sarcoidosis admitted with respiratory distress.  He was discharged on 4/11 from the hospital.  Obtunded on arrival, intubated.  Post intubation, found to have pneumothorax s/p chest tube placement.   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 Delton CoombesByrum  Encourage cocaine cessation  CT as above, CT in good position  CT to 20 cm suction.  Care as appropriate ? If he will need pleurodesis   CARDIOVASCULAR A:  Tachycardia  ? ST change on EKG  HFpEF  - ECHO 4/8 with severe chronic cor pulmonale P:  ICU monitoring  EKG now Assess repeat ECHO  given EKG changes > cocaine use  Trend troponin   RENAL A:   No acute issues  P:   Trend BMP / urinary output Replace electrolytes as indicated Avoid nephrotoxic agents, ensure adequate renal perfusion  GASTROINTESTINAL A:   Hx GERD P:   PPI for SUP while on vent    HEMATOLOGIC A:   Mild Anemia  P:  Trend CBC   INFECTIOUS A:   Previously on Doxycycline - at discharge 4/11 for possible exacerbation  P:   Follow WBC / fever curve   ENDOCRINE A:   Hx Hyperglycemia    P:   Trend glucose   NEUROLOGIC A:   Acute Metabolic Encephalopathy  P:   RASS goal: 0 to -1  Propofol for sedation  SBT / WUA with hopeful extubation    FAMILY  - Updates: Updated wife via phone 4/12.    - Inter-disciplinary family meet or Palliative Care meeting due by: 4/19  CC Time:  30 minutes   Canary Brim, NP-C [facility] Pgr: [phone] or if no answer [phone] May 04, 2017, 11:57 AM

## 2017-04-22 NOTE — Progress Notes (Signed)
Initial Nutrition Assessment  DOCUMENTATION CODES:   Not applicable  INTERVENTION:  - If pt to remains intubated >/= 24 hours, recommend Vital AF 1.2 @ 45 mL/hr. This regimen + kcal from current Propofol rate will provide 1700 kcal (101% estimated kcal need), 81 grams of protein, and 876 mL free water.   - Will monitor Propofol rate/changes.   NUTRITION DIAGNOSIS:   Inadequate oral intake related to inability to eat as evidenced by NPO status.  GOAL:   Patient will meet greater than or equal to 90% of their needs  MONITOR:   Vent status, Weight trends, Labs  REASON FOR ASSESSMENT:   Ventilator  ASSESSMENT:   52 year-old male who was d/c'ed on 4/11 and presented to the ED on 4/12 with shortness of breath. History of COPD and lung cancer. Mental status declined in route to the hospital. EMS reported he was speaking to them initially. He was intubated emergently.  BMI indicates normal weight. No family/visitors present at this time. Pt intubated, sedated, with OGT in place. Per review of abdominal x-ray results report from yesterday, "tip and side port of the enteric tube below the diaphragm in the stomach." Per chart review, pt gained 5 lbs from 2/28-4/3 and then lost 8 lbs (5.5% body weight) from 4/3-4/11. Will need to monitor weight trends as weight decreased during previous hospitalization (diuretics during that time?).   Per Dr. Lottie DawsonScatliffe's note early this AM: concern for R-sided pneumothorax, chest tube placed with need for CT chest for further evaluation following tube placement, pt with hx of COPD and sarcoidosis, wife had reported pt using cocaine PTA.   Patient is currently intubated on ventilator support MV: 10.1 L/min Temp (24hrs), Avg:98.3 F (36.8 C), Min:97.5 F (36.4 C), Max:98.7 F (37.1 C) Propofol: 15.3 ml/hr (404 kcal). BP: 92/69 and MAP: 74  Medications reviewed; sliding scale Novolog, 40 mg Protonix per OGT/day.  Labs reviewed; CBGs: 119 and 109 mg/dL  today, Cl: 90 mmol/L, BUN: 24 mg/dL.  Drip: Propofol @ 40 mcg/kg/min.    NUTRITION - FOCUSED PHYSICAL EXAM:  Completed/assessed with no muscle and no fat wasting, no edema at this time.   Diet Order:  Diet NPO time specified  EDUCATION NEEDS:   No education needs have been identified at this time  Skin:  Skin Assessment: Reviewed RN Assessment  Last BM:  4/10  Height:   Ht Readings from Last 1 Encounters:  Jul 24, 2017 5\' 7"  (1.702 m)    Weight:   Wt Readings from Last 1 Encounters:  Jul 24, 2017 137 lb 9.1 oz (62.4 kg)    Ideal Body Weight:  67.27 kg  BMI:  Body mass index is 21.55 kg/m.  Estimated Nutritional Needs:   Kcal:  1676  Protein:  75-94 grams (1.2-1.5 grams/kg)  Fluid:  >/= 1.8 L/day       Trenton GammonJessica Sema Stangler, MS, RD, LDN, [facility] Inpatient Clinical Dietitian Pager # [phone] After hours/weekend pager # [phone]

## 2017-04-23 ENCOUNTER — Inpatient Hospital Stay (HOSPITAL_COMMUNITY): Payer: 59

## 2017-04-23 DIAGNOSIS — J9601 Acute respiratory failure with hypoxia: Secondary | ICD-10-CM

## 2017-04-23 DIAGNOSIS — J939 Pneumothorax, unspecified: Secondary | ICD-10-CM

## 2017-04-23 LAB — GLUCOSE, CAPILLARY
GLUCOSE-CAPILLARY: 105 mg/dL — AB (ref 65–99)
GLUCOSE-CAPILLARY: 114 mg/dL — AB (ref 65–99)
GLUCOSE-CAPILLARY: 72 mg/dL (ref 65–99)
GLUCOSE-CAPILLARY: 86 mg/dL (ref 65–99)
GLUCOSE-CAPILLARY: 137 mg/dL — AB (ref 65–99)
Glucose-Capillary: 99 mg/dL (ref 65–99)

## 2017-04-23 LAB — BASIC METABOLIC PANEL
ANION GAP: 11 (ref 5–15)
BUN: 27 mg/dL — ABNORMAL HIGH (ref 6–20)
CHLORIDE: 93 mmol/L — AB (ref 101–111)
CO2: 32 mmol/L (ref 22–32)
Calcium: 8.7 mg/dL — ABNORMAL LOW (ref 8.9–10.3)
Creatinine, Ser: 0.77 mg/dL (ref 0.61–1.24)
GFR calc non Af Amer: >60 mL/min (ref >60)
Glucose, Bld: 96 mg/dL (ref 65–99)
POTASSIUM: 4 mmol/L (ref 3.5–5.1)
Sodium: 136 mmol/L (ref 135–145)

## 2017-04-23 LAB — CBC
HEMATOCRIT: 36.3 % — AB (ref 39.0–52.0)
HEMOGLOBIN: 11.1 g/dL — AB (ref 13.0–17.0)
MCH: 24.8 pg — ABNORMAL LOW (ref 26.0–34.0)
MCHC: 30.6 g/dL (ref 30.0–36.0)
MCV: 81 fL (ref 78.0–100.0)
Platelets: 360 10*3/uL (ref 150–400)
RBC: 4.48 MIL/uL (ref 4.22–5.81)
RDW: 13.6 % (ref 11.5–15.5)
WBC: 10.6 10*3/uL — AB (ref 4.0–10.5)

## 2017-04-23 LAB — PROCALCITONIN: PROCALCITONIN: 0.21 ng/mL

## 2017-04-23 LAB — ECHOCARDIOGRAM COMPLETE
Height: 67 in
WEIGHTICAEL: 2151.69 [oz_av]

## 2017-04-23 MED ORDER — IPRATROPIUM-ALBUTEROL 0.5-2.5 (3) MG/3ML IN SOLN
3.0000 mL | RESPIRATORY_TRACT | Status: DC
  Administered 2017-04-23 – 2017-04-28 (×29): 3 mL via RESPIRATORY_TRACT
  Filled 2017-04-23 (×30): qty 3

## 2017-04-23 MED ORDER — FENTANYL CITRATE (PF) 100 MCG/2ML IJ SOLN
25.0000 ug | Freq: Once | INTRAMUSCULAR | Status: AC
  Administered 2017-04-23: 25 ug via INTRAVENOUS
  Filled 2017-04-23: qty 2

## 2017-04-23 MED ORDER — FENTANYL CITRATE (PF) 100 MCG/2ML IJ SOLN
25.0000 ug | Freq: Once | INTRAMUSCULAR | Status: AC
  Administered 2017-04-23: 25 ug via INTRAVENOUS

## 2017-04-23 MED ORDER — HYDROCODONE-ACETAMINOPHEN 5-325 MG PO TABS
1.0000 | ORAL_TABLET | ORAL | Status: DC | PRN
  Administered 2017-04-24 (×3): 2 via ORAL
  Filled 2017-04-23 (×4): qty 2

## 2017-04-23 MED ORDER — LIDOCAINE HCL 2 % IJ SOLN
INTRAMUSCULAR | Status: AC
  Filled 2017-04-23: qty 20

## 2017-04-23 MED ORDER — FENTANYL CITRATE (PF) 100 MCG/2ML IJ SOLN
INTRAMUSCULAR | Status: AC
  Filled 2017-04-23: qty 2

## 2017-04-23 MED ORDER — PROMETHAZINE HCL 25 MG/ML IJ SOLN
12.5000 mg | Freq: Three times a day (TID) | INTRAMUSCULAR | Status: DC | PRN
  Administered 2017-04-23: 12.5 mg via INTRAVENOUS
  Filled 2017-04-23 (×2): qty 1

## 2017-04-23 MED ORDER — LIDOCAINE HCL (PF) 1 % IJ SOLN
INTRAMUSCULAR | Status: AC
  Administered 2017-04-23: 5 mL
  Filled 2017-04-23: qty 5

## 2017-04-23 MED ORDER — ONDANSETRON HCL 4 MG/2ML IJ SOLN
4.0000 mg | Freq: Three times a day (TID) | INTRAMUSCULAR | Status: DC | PRN
  Administered 2017-04-23 – 2017-04-24 (×3): 4 mg via INTRAVENOUS
  Filled 2017-04-23 (×3): qty 2

## 2017-04-23 MED ORDER — HYDROMORPHONE HCL 1 MG/ML IJ SOLN
0.5000 mg | INTRAMUSCULAR | Status: DC | PRN
  Administered 2017-04-23 – 2017-04-24 (×6): 0.5 mg via INTRAVENOUS
  Filled 2017-04-23 (×6): qty 1

## 2017-04-23 NOTE — Procedures (Signed)
Chest Tube Insertion Procedure Note  Indications:  Clinically significant Pneumothorax  Pre-operative Diagnosis: Pneumothorax  Post-operative Diagnosis: Pneumothorax  Procedure Details  Informed consent was obtained for the procedure, including sedation.  Risks of lung perforation, hemorrhage, arrhythmia, and adverse drug reaction were discussed.   After sterile skin prep, using standard technique, a 28 French tube was placed in the right anterior 4th rib space.  Findings: None  Estimated Blood Loss:  Minimal         Specimens:  None              Complications:  None; patient tolerated the procedure well.         Disposition: ICU - extubated and stable.         Condition: stable  Attending Attestation: I was present for the entire procedure.

## 2017-04-23 NOTE — Progress Notes (Signed)
PULMONARY / CRITICAL CARE MEDICINE   Name: [name] MRN: [id number] DOB: 02/24/1965    ADMISSION DATE:  2017/12/11 CONSULTATION DATE: 2017-08-14  REFERRING MD:  Manus Gunningancour, MD [facility]  CHIEF COMPLAINT: Increased dyspnea  HISTORY OF PRESENT ILLNESS:   52 yr old male, smoker, chronic cocaine/opiate abuse, with PMH of severe obstructive lung disease, burnt out sarcoid with large bullae, OSA on CPAP, chronic diastolic CHF, recently discharged on 04/21/17 presents on 4/12 s/p cocaine use with dyspnea and chest pain now intubated on mechanical ventilation s/p R chest tube for pneumothorax.     PAST MEDICAL HISTORY :  He  has a past medical history of Bronchitis, CHF (congestive heart failure) (HCC), Chronic respiratory failure (HCC), COPD (chronic obstructive pulmonary disease) (HCC), GERD (gastroesophageal reflux disease), Hypertension, OSA on CPAP, Pneumonia, and Sarcoidosis.  PAST SURGICAL HISTORY: He  has a past surgical history that includes Rotator cuff repair; arm surgery ; and Video assisted thoracoscopy (vats)/thorocotomy (Left, 07/17/2012).  Allergies  Allergen Reactions  . Clindamycin/Lincomycin Anaphylaxis and Swelling    Made face and tongue swell    No current facility-administered medications on file prior to encounter.    Current Outpatient Medications on File Prior to Encounter  Medication Sig  . albuterol (PROVENTIL HFA;VENTOLIN HFA) 108 (90 Base) MCG/ACT inhaler Inhale 2 puffs into the lungs every 6 (six) hours as needed for wheezing or shortness of breath.  Marland Kitchen. albuterol (PROVENTIL) (2.5 MG/3ML) 0.083% nebulizer solution USE 1 VIAL IN NEBULIZER EVERY 6 HOURS AS NEEDED FOR WHEEZING  . calcium-vitamin D (OSCAL WITH D) 500-200 MG-UNIT tablet Take 1 tablet by mouth daily.  . cyclobenzaprine (FLEXERIL) 10 MG tablet Take 10 mg by mouth 3 (three) times daily as needed for muscle spasms.   . diphenhydrAMINE (BENADRYL) 25 MG tablet Take 25 mg by mouth every 6 (six) hours as  needed for itching or allergies.  . Ergocalciferol (VITAMIN D2) 400 units TABS Take 400 Units by mouth daily.  . ferrous sulfate 325 (65 FE) MG tablet Take 325 mg by mouth 2 (two) times daily with a meal.  . ipratropium-albuterol (DUONEB) 0.5-2.5 (3) MG/3ML SOLN Take 3 mLs by nebulization every 4 (four) hours as needed. (Patient taking differently: Take 3 mLs by nebulization every 4 (four) hours as needed (SOB, wheezing). )  . Multiple Vitamins-Minerals (MULTIVITAMIN WITH MINERALS) tablet Take 1 tablet by mouth every morning.   Marland Kitchen. omeprazole (PRILOSEC) 40 MG capsule Take 40 mg by mouth daily.   . ondansetron (ZOFRAN) 4 MG tablet Take 1 tablet (4 mg total) by mouth every 8 (eight) hours as needed for nausea or vomiting.  . pregabalin (LYRICA) 200 MG capsule Take 200 mg by mouth 2 (two) times daily.   . saline (AYR) GEL Place 1 application into the nose every 4 (four) hours as needed (dryness/irritation).  . SYMBICORT 160-4.5 MCG/ACT inhaler Inhale 1 puff into the lungs 2 (two) times daily.  Marland Kitchen. diltiazem (CARDIZEM CD) 180 MG 24 hr capsule Take 1 capsule (180 mg total) by mouth daily.  . furosemide (LASIX) 40 MG tablet Take 1 tablet (40 mg total) by mouth daily.  . OXYGEN Inhale 4 L into the lungs continuous.  . potassium chloride SA (K-DUR,KLOR-CON) 20 MEQ tablet Take 1 tablet (20 mEq total) by mouth daily.  . predniSONE (DELTASONE) 20 MG tablet Take 2 tablets (40 mg total) by mouth daily with breakfast.  . Respiratory Therapy Supplies (FLUTTER) DEVI As directed.  . tamsulosin (FLOMAX) 0.4 MG CAPS capsule Take  1 capsule (0.4 mg total) by mouth daily after supper.    FAMILY HISTORY:  His indicated that his mother is alive. He indicated that his father is deceased. He indicated that his sister is alive. He indicated that all of his four brothers are alive.   SOCIAL HISTORY: He  reports that he has quit smoking. His smoking use included cigarettes. He has never used smokeless tobacco. He reports  that he drinks alcohol. He reports that he has current or past drug history. Drugs: "Crack" cocaine and Cocaine.  REVIEW OF SYSTEMS:   Pertinent positives are included in the subjective evaluation and in the history of present illness.  Remaining systems are negative.  SUBJECTIVE:  Patient is experiencing increasing dyspnea of the today with increasing skin tightness.  Chest x-ray revealed that the chest tube had migrated out of the chest with increasing subcutaneous emphysema.  The chest tube was removed which resulted unfortunately in rapid worsening of the subcutaneous emphysema with a blowing chest wound.    I attempted to reinforce the chest tube insertion site with a stitch and a pressure dressing.  Dr. Daphine Deutscher kindly came by and also advises the same course of action.  Unfortunately, leak persisted with worsening right-sided subcutaneous emphysema.  The patient began to complain of increasing dyspnea.  A new chest tube was inserted and placed to suction.  On reassessment later in the afternoon, the patient's dyspnea had significantly improved.  VITAL SIGNS: BP (!) 164/135   Pulse 100   Temp (!) 97.4 F (36.3 C) (Oral)   Resp 13   Ht 5\' 7"  (1.702 m)   Wt 134 lb 7.7 oz (61 kg)   SpO2 100%   BMI 21.06 kg/m   HEMODYNAMICS: On no vasopressor medication    VENTILATOR SETTINGS: On 100% nonrebreather to promote reabsorption of subcutaneous air.    INTAKE / OUTPUT: I/O last 3 completed shifts: In: 429.7 [P.O.:240; I.V.:189.7] Out: 2795 [Urine:2670; Emesis/NG output:35; Chest Tube:90]  PHYSICAL EXAMINATION: General: Thin cachectic gentleman in visible pain favoring right chest tube insertion site. Neuro: No focal neurological deficits.  Sensorium is clear.  Patient is cooperative. HEENT: Trachea is midline.  Significant subcutaneous emphysema over the right face and involving the eye which has improved following insertion of a new chest tube. Cardiovascular: Extremities warm  and well perfused.  Heart sounds are unremarkable.  No jugular venous distention.  No edema. Lungs: Occasional wheezes throughout.  Distant lung sounds.  Moderate air leak dry suction canister.  Fluid level titles appropriately. Abdomen: Appetite is good.  Abdomen is soft and nontender. Musculoskeletal: Marked muscle wasting. Skin: Skin is intact.  The sites are intact.  LABS:  BMET Recent Labs  Lab 05/01/2017 0057 05/05/2017 0127 04/27/2017 0513 04/23/17 0341  NA 133* 131* 136 136  K 5.0 4.9 3.9 4.0  CL 88* 89* 90* 93*  CO2 32  --  28 32  BUN 22* 23* 24* 27*  CREATININE 0.95 0.90 1.09 0.77  GLUCOSE 213* 217* 116* 96    Electrolytes Recent Labs  Lab 05/09/2017 0057 04/12/2017 0513 04/23/17 0341  CALCIUM 9.1 9.4 8.7*  MG  --  2.3  --   PHOS  --  2.9  --     CBC Recent Labs  Lab 04/26/2017 0057 05/08/2017 0127 04/16/2017 0513 04/23/17 0341  WBC 14.2*  --  14.8* 10.6*  HGB 13.5 15.6 12.3* 11.1*  HCT 43.5 46.0 39.6 36.3*  PLT 483*  --  418* 360  Coag's Recent Labs  Lab 04/19/2017 0057  INR 0.91    Sepsis Markers Recent Labs  Lab 04/24/2017 0127 04/20/2017 0513 04/23/17 0341  LATICACIDVEN 3.23*  --   --   PROCALCITON  --  0.21 0.21    ABG Recent Labs  Lab 04/27/2017 0123 04/21/2017 0345  PHART 7.370 7.472*  PCO2ART 55.1* 40.1  PO2ART 337* 142*    Liver Enzymes Recent Labs  Lab 04/17/2017 0057  AST 38  ALT 31  ALKPHOS 80  BILITOT 0.6  ALBUMIN 4.0    Cardiac Enzymes Recent Labs  Lab 04/30/2017 0513 04/29/2017 0939 04/13/2017 1533  TROPONINI 0.04* 0.04* 0.03*    Glucose Recent Labs  Lab 05/08/2017 2319 04/23/17 0415 04/23/17 0806 04/23/17 1122 04/23/17 1510 04/23/17 1939  GLUCAP 100* 105* 99 114* 72 86    Imaging Dg Chest Port 1 View  Result Date: 04/23/2017 CLINICAL DATA:  RIGHT pneumothorax EXAM: PORTABLE CHEST 1 VIEW COMPARISON:  Portable exam 1418 hours compared to 0949 hours FINDINGS: RIGHT thoracostomy tube unchanged. Extensive chest wall  emphysema bilaterally into the cervical regions. Pneumomediastinum persists. Persistent lucency at the medial upper RIGHT hemithorax could represent medial pneumothorax or pneumomediastinum. Linear lucency extends into the mid RIGHT lung likely representing loculated pneumothorax within a fissure. Atelectasis at RIGHT mid lung unchanged. Stable heart size. Subsegmental atelectasis LEFT base. IMPRESSION: Extensive chest wall emphysema. Probable pneumomediastinum though a linear gas collection extends into the mid RIGHT lung suspect loculated pneumothorax within a RIGHT fissure. Electronically Signed   By: Ulyses Southward M.D.   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 Intubated with right side pneumothorax. Chest tube adjustment/replacement. EXAM: PORTABLE CHEST 1 VIEW COMPARISON:  0426 hours today and earlier. FINDINGS: Portable AP semi upright view at 0949 hours. The right side chest tube has been replaced or reposition and now courses to the right lung apex with side hole projecting inside the pleural space. Moderate to severe right greater than left chest wall and neck subcutaneous emphysema has progressed since this morning. Evidence of associated pneumomediastinum. This now skiers the right lung pleural detail, but a small persistent right pneumothorax is suspected. Chronic lung architectural distortion greater on the left. Larger lung volumes since this morning. Stable cardiac size and mediastinal contours. No new pulmonary opacity. IMPRESSION: 1. Repositioned or replaced right chest tube, now in satisfactory position. 2. Progressed and now severe right greater than left chest wall and neck subcutaneous gas since 0426 hours today. Evidence now of associated pneumomediastinum. 3. Probable stable small right pneumothorax. 4. Chronic lung disease.  No new pulmonary opacity. Electronically Signed   By: Odessa Fleming M.D.   On:  04/23/2017 10:25   Dg Chest Port 1 View  Addendum Date: 04/23/2017   ADDENDUM REPORT: 04/23/2017 09:04 ADDENDUM: Results discussed with the patient's nurse. Electronically Signed   By: Gerome Sam III M.D   On: 04/23/2017 09:04   Result Date: 04/23/2017 CLINICAL DATA:  History of CHF.  Follow-up pneumothorax. EXAM: PORTABLE CHEST 1 VIEW COMPARISON:  Chest CT April 22, 2017 and chest x-rays April 22, 2017 FINDINGS: The right-sided chest tube has been pulled back in the interval. The distal tip projects over the right mid lung with the side port now outside the pleural space. There is increased air in the subcutaneous tissues. There is a small right pneumothorax which is unchanged since the CT scan from yesterday. Bullous changes as well  persist. Infiltrate in both lungs is essentially stable. The cardiomediastinal silhouette is unchanged. The ET and NG tubes have been removed. No other interval changes. IMPRESSION: 1. The chest tube has been pulled back several cm in the interval and the side port is now outside of the right pleural space with a marked increase in air in the subcutaneous tissues of the right chest and neck. The small right pneumothorax is stable since the April 22, 2017 CT scan. 2. ET and NG tubes have been removed. 3. The bilateral pulmonary infiltrates are similar in the interval. I will call the findings to the referring clinical team. Electronically Signed: By: Gerome Sam III M.D On: 04/23/2017 07:09     STUDIES:  Post chest tube insertion chest x-ray shows lung reexpansion.  Significant continuous emphysema is noted.  ANTIBIOTICS: On no antibiotics  SIGNIFICANT EVENTS: Chest tube replaced as mentioned above  LINES/TUBES: Right 28 French chest tube.  Peripheral IVs  DISCUSSION: 52 year old man with significant bullous emphysema and pneumothorax likely secondary to with rupture of latter.  Pneumothorax now appropriate controlled improving subcutaneous emphysema  following replacement of chest tube.  ASSESSMENT / PLAN:  PULMONARY A: Pneumothorax from bullous disease.  Underlying COPD.  Respiratory status appears to be close to baseline.  Significant chest tube site pain. P:   -Continue chest tube drainage at -40 cm of water suction.  Daily chest x-ray. -Continue current COPD regimen. -Hydromorphone and ketorolac for chest tube related chest pain.  CARDIOVASCULAR A:  Hemodynamically stable with no signs of shock.  In sinus rhythm.  New ECG changes with modest troponin rise.  No wall motion abnormalities on echo cardiac P:  Continue current management.  No intervention is required for type II myocardial injury.  RENAL A:   Clinically euvolemic with normal creatinine.  Mild chronic hypercarbia related to COPD. P:   Continue to allow volume autoregulation.  GASTROINTESTINAL A:   No active GI issues P:   Allow full enteral nutrition.  HEMATOLOGIC A:   Anemia of chronic disease.  P:  No indications for transfusion  INFECTIOUS A:   Currently no evidence of infection P:   Remains off antibiotics.  ENDOCRINE A:   Euglycemic no insulin therapy P:   Continue current management  NEUROLOGIC A:   Neurologically intact.  In pain. P:   Dilaudid for pain.   FAMILY  - Updates: Nurse has updated his wife.   Pulmonary and Critical Care Medicine [facility] Pager: [phone]  04/23/2017, 7:50 PM

## 2017-04-23 NOTE — Progress Notes (Signed)
  Echocardiogram 2D Echocardiogram has been performed.  Corneilus Heggie T Kiandra Sanguinetti 04/23/2017, 10:35 AM

## 2017-04-24 ENCOUNTER — Inpatient Hospital Stay (HOSPITAL_COMMUNITY): Payer: 59

## 2017-04-24 LAB — GLUCOSE, CAPILLARY
GLUCOSE-CAPILLARY: 108 mg/dL — AB (ref 65–99)
GLUCOSE-CAPILLARY: 82 mg/dL (ref 65–99)
Glucose-Capillary: 59 mg/dL — ABNORMAL LOW (ref 65–99)
Glucose-Capillary: 77 mg/dL (ref 65–99)
Glucose-Capillary: 99 mg/dL (ref 65–99)
Glucose-Capillary: 73 mg/dL (ref 65–99)
Glucose-Capillary: 84 mg/dL (ref 65–99)

## 2017-04-24 LAB — PROCALCITONIN: Procalcitonin: 0.17 ng/mL

## 2017-04-24 MED ORDER — PROMETHAZINE HCL 25 MG/ML IJ SOLN: 12.5000 mg | Freq: Three times a day (TID) | INTRAMUSCULAR | Status: DC | PRN

## 2017-04-24 MED ORDER — PROMETHAZINE HCL 25 MG/ML IJ SOLN
12.5000 mg | Freq: Three times a day (TID) | INTRAMUSCULAR | Status: DC | PRN
  Administered 2017-04-24: 12.5 mg via INTRAVENOUS
  Filled 2017-04-24: qty 1

## 2017-04-24 MED ORDER — HYDROMORPHONE HCL 1 MG/ML IJ SOLN
0.5000 mg | INTRAMUSCULAR | Status: DC | PRN
  Administered 2017-04-24 – 2017-04-26 (×11): 2 mg via INTRAVENOUS
  Administered 2017-04-26: 1 mg via INTRAVENOUS
  Administered 2017-04-26 – 2017-04-27 (×6): 2 mg via INTRAVENOUS
  Administered 2017-04-28 – 2017-05-02 (×3): 1 mg via INTRAVENOUS
  Administered 2017-05-03: 2 mg via INTRAVENOUS
  Administered 2017-05-03: 1 mg via INTRAVENOUS
  Administered 2017-05-03: 2 mg via INTRAVENOUS
  Administered 2017-05-04 (×2): 1 mg via INTRAVENOUS
  Administered 2017-05-04 – 2017-05-05 (×9): 2 mg via INTRAVENOUS
  Administered 2017-05-06: 1 mg via INTRAVENOUS
  Administered 2017-05-06: 2 mg via INTRAVENOUS
  Filled 2017-04-24 (×7): qty 2
  Filled 2017-04-24 (×2): qty 1
  Filled 2017-04-24 (×8): qty 2
  Filled 2017-04-24: qty 1
  Filled 2017-04-24 (×17): qty 2
  Filled 2017-04-24: qty 1
  Filled 2017-04-24 (×2): qty 2

## 2017-04-24 MED ORDER — PROMETHAZINE HCL 25 MG PO TABS
12.5000 mg | ORAL_TABLET | Freq: Four times a day (QID) | ORAL | Status: DC | PRN
  Administered 2017-04-25 – 2017-04-27 (×4): 12.5 mg via ORAL
  Filled 2017-04-24 (×4): qty 1

## 2017-04-24 MED ORDER — ONDANSETRON HCL 4 MG/2ML IJ SOLN
4.0000 mg | Freq: Three times a day (TID) | INTRAMUSCULAR | Status: DC | PRN
  Administered 2017-04-24 – 2017-05-15 (×11): 4 mg via INTRAVENOUS
  Filled 2017-04-24 (×13): qty 2

## 2017-04-24 MED ORDER — HYDROMORPHONE HCL 2 MG PO TABS
2.0000 mg | ORAL_TABLET | ORAL | Status: DC | PRN
  Administered 2017-04-24 – 2017-05-04 (×27): 2 mg via ORAL
  Filled 2017-04-24 (×28): qty 1

## 2017-04-24 NOTE — Progress Notes (Signed)
PULMONARY / CRITICAL CARE MEDICINE   Name: [name] MRN: [id number] DOB: [birth year]    ADMISSION DATE:  05/05/2017 CONSULTATION DATE: 04/25/2017  REFERRING MD:  Manus Gunning, MD [name]  CHIEF COMPLAINT: Increased dyspnea  HISTORY OF PRESENT ILLNESS:   52 yr old male, smoker, chronic cocaine/opiate abuse, with PMH of severe obstructive lung disease, burnt out sarcoid with large bullae, OSA on CPAP, chronic diastolic CHF, recently discharged on 04/21/17 presents on 4/12 s/p cocaine use with dyspnea and chest pain now intubated on mechanical ventilation s/p R chest tube for pneumothorax.   4/13 developed subcutaneous emphysema when chest tube migrated out of thoracic cavity. Improved when new tube placed.  PAST MEDICAL HISTORY :  He  has a past medical history of Bronchitis, CHF (congestive heart failure) (HCC), Chronic respiratory failure (HCC), COPD (chronic obstructive pulmonary disease) (HCC), GERD (gastroesophageal reflux disease), Hypertension, OSA on CPAP, Pneumonia, and Sarcoidosis.  PAST SURGICAL HISTORY: He  has a past surgical history that includes Rotator cuff repair; arm surgery ; and Video assisted thoracoscopy (vats)/thorocotomy (Left, 07/17/2012).  Allergies  Allergen Reactions  . Clindamycin/Lincomycin Anaphylaxis and Swelling    Made face and tongue swell    No current facility-administered medications on file prior to encounter.    Current Outpatient Medications on File Prior to Encounter  Medication Sig  . albuterol (PROVENTIL HFA;VENTOLIN HFA) 108 (90 Base) MCG/ACT inhaler Inhale 2 puffs into the lungs every 6 (six) hours as needed for wheezing or shortness of breath.  Marland Kitchen albuterol (PROVENTIL) (2.5 MG/3ML) 0.083% nebulizer solution USE 1 VIAL IN NEBULIZER EVERY 6 HOURS AS NEEDED FOR WHEEZING  . calcium-vitamin D (OSCAL WITH D) 500-200 MG-UNIT tablet Take 1 tablet by mouth daily.  . cyclobenzaprine (FLEXERIL) 10 MG tablet Take 10 mg by mouth 3 (three) times daily  as needed for muscle spasms.   . diphenhydrAMINE (BENADRYL) 25 MG tablet Take 25 mg by mouth every 6 (six) hours as needed for itching or allergies.  . Ergocalciferol (VITAMIN D2) 400 units TABS Take 400 Units by mouth daily.  . ferrous sulfate 325 (65 FE) MG tablet Take 325 mg by mouth 2 (two) times daily with a meal.  . ipratropium-albuterol (DUONEB) 0.5-2.5 (3) MG/3ML SOLN Take 3 mLs by nebulization every 4 (four) hours as needed. (Patient taking differently: Take 3 mLs by nebulization every 4 (four) hours as needed (SOB, wheezing). )  . Multiple Vitamins-Minerals (MULTIVITAMIN WITH MINERALS) tablet Take 1 tablet by mouth every morning.   Marland Kitchen omeprazole (PRILOSEC) 40 MG capsule Take 40 mg by mouth daily.   . ondansetron (ZOFRAN) 4 MG tablet Take 1 tablet (4 mg total) by mouth every 8 (eight) hours as needed for nausea or vomiting.  . pregabalin (LYRICA) 200 MG capsule Take 200 mg by mouth 2 (two) times daily.   . saline (AYR) GEL Place 1 application into the nose every 4 (four) hours as needed (dryness/irritation).  . SYMBICORT 160-4.5 MCG/ACT inhaler Inhale 1 puff into the lungs 2 (two) times daily.  Marland Kitchen diltiazem (CARDIZEM CD) 180 MG 24 hr capsule Take 1 capsule (180 mg total) by mouth daily.  . furosemide (LASIX) 40 MG tablet Take 1 tablet (40 mg total) by mouth daily.  . OXYGEN Inhale 4 L into the lungs continuous.  . potassium chloride SA (K-DUR,KLOR-CON) 20 MEQ tablet Take 1 tablet (20 mEq total) by mouth daily.  . predniSONE (DELTASONE) 20 MG tablet Take 2 tablets (40 mg total) by mouth daily with breakfast.  .  Respiratory Therapy Supplies (FLUTTER) DEVI As directed.  . tamsulosin (FLOMAX) 0.4 MG CAPS capsule Take 1 capsule (0.4 mg total) by mouth daily after supper.    FAMILY HISTORY:  His indicated that his mother is alive. He indicated that his father is deceased. He indicated that his sister is alive. He indicated that all of his four brothers are alive.   SOCIAL HISTORY: He   reports that he has quit smoking. His smoking use included cigarettes. He has never used smokeless tobacco. He reports that he drinks alcohol. He reports that he has current or past drug history. Drugs: "Crack" cocaine and Cocaine.  REVIEW OF SYSTEMS:   Pertinent positives are included in the subjective evaluation and in the history of present illness.  Remaining systems are negative.  SUBJECTIVE:   Breathing is improved. Continues to have pain at chest tube insertion site. Dilaudid iv relieves pain adequately but relief doesn't last.  VITAL SIGNS: BP (!) 126/96   Pulse 100   Temp 98.1 F (36.7 C) (Oral)   Resp (!) 30   Ht 5\' 7"  (1.702 m)   Wt 134 lb 7.7 oz (61 kg)   SpO2 100%   BMI 21.06 kg/m   HEMODYNAMICS: On no vasopressor medication    VENTILATOR SETTINGS: On 100% nonrebreather to promote reabsorption of subcutaneous air. FiO2 (%):  [100 %] 100 %  INTAKE / OUTPUT: I/O last 3 completed shifts: In: 240 [P.O.:240] Out: 1765 [Urine:1675; Chest Tube:90]  PHYSICAL EXAMINATION: General: Thin cachectic gentleman in visible pain favoring right chest tube insertion site. Neuro: No focal neurological deficits.  Sensorium is clear.  Patient is cooperative. HEENT: Trachea is midline.  Significant subcutaneous emphysema over the right face and involving the eye which has improved following insertion of a new chest tube.  Chest with small air leak on -40 cmH2O, leak increased when suction dropped to -20 cmH2O.  Cardiovascular: Extremities warm and well perfused.  Heart sounds are unremarkable.  No jugular venous distention.  No edema. Lungs: Occasional wheezes throughout.  Distant lung sounds.  Moderate air leak dry suction canister.  Fluid level titles appropriately. Abdomen: Appetite is good.  Abdomen is soft and nontender. Musculoskeletal: Marked muscle wasting. Skin: Skin is intact.  The sites are intact.  LABS:  BMET Recent Labs  Lab 04/21/2017 0057 04/21/2017 0127  05/04/2017 0513 04/23/17 0341  NA 133* 131* 136 136  K 5.0 4.9 3.9 4.0  CL 88* 89* 90* 93*  CO2 32  --  28 32  BUN 22* 23* 24* 27*  CREATININE 0.95 0.90 1.09 0.77  GLUCOSE 213* 217* 116* 96    Electrolytes Recent Labs  Lab 05/03/2017 0057 04/25/2017 0513 04/23/17 0341  CALCIUM 9.1 9.4 8.7*  MG  --  2.3  --   PHOS  --  2.9  --     CBC Recent Labs  Lab 05/07/2017 0057 04/28/2017 0127 05/03/2017 0513 04/23/17 0341  WBC 14.2*  --  14.8* 10.6*  HGB 13.5 15.6 12.3* 11.1*  HCT 43.5 46.0 39.6 36.3*  PLT 483*  --  418* 360    Coag's Recent Labs  Lab 04/12/2017 0057  INR 0.91    Sepsis Markers Recent Labs  Lab 04/18/2017 0127 04/28/2017 0513 04/23/17 0341 04/24/17 0326  LATICACIDVEN 3.23*  --   --   --   PROCALCITON  --  0.21 0.21 0.17    ABG Recent Labs  Lab 04/20/2017 0123 05/08/2017 0345  PHART 7.370 7.472*  PCO2ART 55.1* 40.1  PO2ART 337* 142*    Liver Enzymes Recent Labs  Lab 05-09-2017 0057  AST 38  ALT 31  ALKPHOS 80  BILITOT 0.6  ALBUMIN 4.0    Cardiac Enzymes Recent Labs  Lab May 09, 2017 0513 05/09/2017 0939 05/09/2017 1533  TROPONINI 0.04* 0.04* 0.03*    Glucose Recent Labs  Lab 04/23/17 1939 04/23/17 2317 04/24/17 0301 04/24/17 0317 04/24/17 0808 04/24/17 1139  GLUCAP 86 137* 59* 77 99 108*    Imaging Dg Chest Port 1 View  Result Date: 04/24/2017 CLINICAL DATA:  Follow-up right pneumothorax. Sarcoidosis. Acute respiratory failure with hypoxia. EXAM: PORTABLE CHEST 1 VIEW COMPARISON:  04/23/2017 FINDINGS: Right chest tube remains in place. A small less than 5% right apical pneumothorax is seen on today's study. Pneumomediastinum is unchanged. Extensive bilateral chest wall subcutaneous emphysema is again seen. Heart size remains within normal limits. Pulmonary infiltrates in both midlung zones shows no significant change. IMPRESSION: Small less than 5% right apical pneumothorax, with right chest tube remaining in place. Stable pneumomediastinum  extensive subcutaneous chest wall emphysema. Electronically Signed   By: Myles Rosenthal M.D.   On: 04/24/2017 10:21   Dg Chest Port 1 View  Result Date: 04/23/2017 CLINICAL DATA:  RIGHT pneumothorax EXAM: PORTABLE CHEST 1 VIEW COMPARISON:  Portable exam 1418 hours compared to 0949 hours FINDINGS: RIGHT thoracostomy tube unchanged. Extensive chest wall emphysema bilaterally into the cervical regions. Pneumomediastinum persists. Persistent lucency at the medial upper RIGHT hemithorax could represent medial pneumothorax or pneumomediastinum. Linear lucency extends into the mid RIGHT lung likely representing loculated pneumothorax within a fissure. Atelectasis at RIGHT mid lung unchanged. Stable heart size. Subsegmental atelectasis LEFT base. IMPRESSION: Extensive chest wall emphysema. Probable pneumomediastinum though a linear gas collection extends into the mid RIGHT lung suspect loculated pneumothorax within a RIGHT fissure. Electronically Signed   By: Ulyses Southward M.D.   On: 04/23/2017 15:15     STUDIES:  Post chest tube insertion chest x-ray shows lung reexpansion.  Significant continuous emphysema is noted.  ANTIBIOTICS: On no antibiotics  SIGNIFICANT EVENTS: Chest tube replaced as mentioned above  LINES/TUBES: Right 28 French chest tube.  Peripheral IVs  DISCUSSION: 52 year old man with significant bullous emphysema and pneumothorax likely secondary to with rupture of latter.  Pneumothorax now appropriate controlled improving subcutaneous emphysema following replacement of chest tube.  ASSESSMENT / PLAN:  PULMONARY A: Pneumothorax from bullous disease.  Underlying COPD.  Respiratory status appears to be close to baseline.  Significant chest tube site pain. P:   -Continue chest tube drainage at -40 cm of water suction.  Daily chest x-ray. -Continue current COPD regimen. -Hydromorphone and ketorolac for chest tube related chest pain.  CARDIOVASCULAR A:  Hemodynamically stable with no  signs of shock.  In sinus rhythm.  New ECG changes with modest troponin rise.  No wall motion abnormalities on echo cardiac P:  Continue current management.  No intervention is required for type II myocardial injury.  RENAL A:   Clinically euvolemic with normal creatinine.  Mild chronic hypercarbia related to COPD. P:   Continue to allow volume autoregulation.  GASTROINTESTINAL A:   No active GI issues P:   Allow full enteral nutrition.  HEMATOLOGIC A:   Anemia of chronic disease.  P:  No indications for transfusion  INFECTIOUS A:   Currently no evidence of infection P:   Remains off antibiotics.  ENDOCRINE A:   Euglycemic no insulin therapy P:   Continue current management  NEUROLOGIC A:   Neurologically intact.  In pain. P:   Switch to oral Dilaudid as longer duration of action.   FAMILY  - Updates: Nurse has updated his wife.   Pulmonary and Critical Care Medicine [facility] Pager: [phone]  04/24/2017, 1:25 PM

## 2017-04-24 NOTE — Progress Notes (Addendum)
eLink Physician-Brief Progress Note Patient Name: [name] DOB: 10/15/1965 MRN: [id number]   Date of Service  04/24/2017  HPI/Events of Note  Severe pain from chest tube - oral and low dose dilaudid not cutting it. Has pai coinversion -baseline cocaine, etoh and opioid use  eICU Interventions  cxr port stat -visualized 11:29 PM 04/24/2017 - no change Increase range and frequency on dilaudid prn     Intervention Category Intermediate Interventions: Pain - evaluation and management  Kalman ShanMurali Jerrid Forgette 04/24/2017, 9:29 PM

## 2017-04-25 ENCOUNTER — Inpatient Hospital Stay (HOSPITAL_COMMUNITY): Payer: 59

## 2017-04-25 DIAGNOSIS — J9312 Secondary spontaneous pneumothorax: Secondary | ICD-10-CM

## 2017-04-25 LAB — GLUCOSE, CAPILLARY
GLUCOSE-CAPILLARY: 78 mg/dL (ref 65–99)
GLUCOSE-CAPILLARY: 101 mg/dL — AB (ref 65–99)
GLUCOSE-CAPILLARY: 98 mg/dL (ref 65–99)
GLUCOSE-CAPILLARY: 84 mg/dL (ref 65–99)
Glucose-Capillary: 118 mg/dL — ABNORMAL HIGH (ref 65–99)
Glucose-Capillary: 105 mg/dL — ABNORMAL HIGH (ref 65–99)

## 2017-04-25 LAB — BASIC METABOLIC PANEL
Anion gap: 13 (ref 5–15)
BUN: 32 mg/dL — ABNORMAL HIGH (ref 6–20)
CO2: 30 mmol/L (ref 22–32)
Calcium: 9.6 mg/dL (ref 8.9–10.3)
Chloride: 90 mmol/L — ABNORMAL LOW (ref 101–111)
Creatinine, Ser: 1.18 mg/dL (ref 0.61–1.24)
GFR calc Af Amer: >60 mL/min (ref >60)
GFR calc non Af Amer: >60 mL/min (ref >60)
Glucose, Bld: 109 mg/dL — ABNORMAL HIGH (ref 65–99)
Potassium: 4.7 mmol/L (ref 3.5–5.1)
Sodium: 133 mmol/L — ABNORMAL LOW (ref 135–145)

## 2017-04-25 LAB — CBC
HCT: 45.3 % (ref 39.0–52.0)
Hemoglobin: 13.7 g/dL (ref 13.0–17.0)
MCH: 25.3 pg — ABNORMAL LOW (ref 26.0–34.0)
MCHC: 30.2 g/dL (ref 30.0–36.0)
MCV: 83.6 fL (ref 78.0–100.0)
Platelets: 504 10*3/uL — ABNORMAL HIGH (ref 150–400)
RBC: 5.42 MIL/uL (ref 4.22–5.81)
RDW: 13.5 % (ref 11.5–15.5)
WBC: 13.5 10*3/uL — ABNORMAL HIGH (ref 4.0–10.5)

## 2017-04-25 LAB — TRIGLYCERIDES: Triglycerides: 159 mg/dL — ABNORMAL HIGH (ref <150)

## 2017-04-25 MED ORDER — HEPARIN SODIUM (PORCINE) 5000 UNIT/ML IJ SOLN
5000.0000 [IU] | Freq: Three times a day (TID) | INTRAMUSCULAR | Status: DC
  Administered 2017-04-25 – 2017-05-17 (×66): 5000 [IU] via SUBCUTANEOUS
  Filled 2017-04-25 (×65): qty 1

## 2017-04-25 MED ORDER — KETOROLAC TROMETHAMINE 30 MG/ML IJ SOLN
30.0000 mg | Freq: Once | INTRAMUSCULAR | Status: AC
  Administered 2017-04-25: 30 mg via INTRAVENOUS
  Filled 2017-04-25: qty 1

## 2017-04-25 MED ORDER — NAPROXEN 250 MG PO TABS
500.0000 mg | ORAL_TABLET | Freq: Two times a day (BID) | ORAL | Status: AC
  Administered 2017-04-25 – 2017-04-27 (×4): 500 mg via ORAL
  Filled 2017-04-25 (×4): qty 2

## 2017-04-25 NOTE — Progress Notes (Signed)
PULMONARY / CRITICAL CARE MEDICINE   Name: [name] MRN: [id number] DOB: 08-18-1965    ADMISSION DATE:  May 12, 2017 CONSULTATION DATE: 05/12/2017  REFERRING MD:  Manus Gunning, MD [name]  CHIEF COMPLAINT: Increased dyspnea  BRIEF SUMMARY:   52 yr old male, smoker, chronic cocaine/opiate abuse, with PMH of severe obstructive lung disease, burnt out sarcoid with large bullae, OSA on CPAP, chronic diastolic CHF, recently discharged on 04/21/17 presents on 4/12 s/p cocaine use with dyspnea and chest pain now intubated on mechanical ventilation s/p R chest tube for pneumothorax.  Developed subcutaneous emphysema 4/13 when chest tube migrated out of thoracic cavity. Improved when new tube placed.     SUBJECTIVE:  Pt reports ongoing pain from chest tube.  States IV pain medication does provide relief but wears out shortly after administration.    VITAL SIGNS: BP (!) 159/107   Pulse 100   Temp 98.7 F (37.1 C) (Oral)   Resp (!) 28   Ht 5\' 7"  (1.702 m)   Wt 134 lb 7.7 oz (61 kg)   SpO2 99%   BMI 21.06 kg/m    VENTILATOR SETTINGS:  FiO2 (%):  [9 %] 9 %  INTAKE / OUTPUT: I/O last 3 completed shifts: In: 1235 [P.O.:1235] Out: 1523 [Urine:1455; Chest Tube:68]  PHYSICAL EXAMINATION: General: thin adult male, sitting up in chair, NAD HEENT: MM pink/moist, [place] O2 PSY: calm/appropriate Neuro: AAOx4, speech clear, MAE CV: s1s2 rrr, no m/r/g PULM: even/non-labored, air movement bilaterally, clear, can hear SQ emphysema upon placing stethoscope on chest YQ:[id number], non-tender, bsx4 active  Extremities: warm/dry, no edema  Skin: no rashes or lesions, tattoos  LABS:  BMET Recent Labs  Lab May 12, 2017 0513 04/23/17 0341 04/25/17 0308  NA 136 136 133*  K 3.9 4.0 4.7  CL 90* 93* 90*  CO2 28 32 30  BUN 24* 27* 32*  CREATININE 1.09 0.77 1.18  GLUCOSE 116* 96 109*    Electrolytes Recent Labs  Lab May 12, 2017 0513 04/23/17 0341 04/25/17 0308  CALCIUM 9.4 8.7* 9.6  MG 2.3  --    --   PHOS 2.9  --   --     CBC Recent Labs  Lab 2017-05-12 0513 04/23/17 0341 04/25/17 0308  WBC 14.8* 10.6* 13.5*  HGB 12.3* 11.1* 13.7  HCT 39.6 36.3* 45.3  PLT 418* 360 504*    Coag's Recent Labs  Lab 05/12/2017 0057  INR 0.91    Sepsis Markers Recent Labs  Lab 05/12/2017 0127 May 12, 2017 0513 04/23/17 0341 04/24/17 0326  LATICACIDVEN 3.23*  --   --   --   PROCALCITON  --  0.21 0.21 0.17    ABG Recent Labs  Lab 2017-05-12 0123 May 12, 2017 0345  PHART 7.370 7.472*  PCO2ART 55.1* 40.1  PO2ART 337* 142*    Liver Enzymes Recent Labs  Lab 05/12/2017 0057  AST 38  ALT 31  ALKPHOS 80  BILITOT 0.6  ALBUMIN 4.0    Cardiac Enzymes Recent Labs  Lab 2017-05-12 0513 05-12-2017 0939 2017/05/12 1533  TROPONINI 0.04* 0.04* 0.03*    Glucose Recent Labs  Lab 04/24/17 1537 04/24/17 1926 04/24/17 2303 04/25/17 0307 04/25/17 0803 04/25/17 1152  GLUCAP 82 73 84 118* 78 101*    Imaging Dg Chest Port 1 View  Result Date: 04/25/2017 CLINICAL DATA:  Pneumothorax. EXAM: PORTABLE CHEST 1 VIEW COMPARISON:  One-view chest x-ray 04/24/2017 FINDINGS: A right-sided chest tube is in place. No significant residual pneumothorax is present. Extensive subcutaneous emphysema is again seen bilaterally. Pneumomediastinum  persists. Bilateral airspace opacities are unchanged. The heart size is normal. IMPRESSION: 1. No significant residual recurrent pneumothorax. 2. Stable significant subcutaneous emphysema. 3. Pneumomediastinum. Electronically Signed   By: Marin Robertshristopher  Mattern M.D.   On: 04/25/2017 07:05   Dg Chest Port 1 View  Result Date: 04/24/2017 CLINICAL DATA:  Chest pain and shortness of breath EXAM: PORTABLE CHEST 1 VIEW COMPARISON:  Study obtained earlier in the day FINDINGS: Chest tube on the right is unchanged in position. No pneumothorax is currently evident. There is, however, pneumomediastinum as well as extensive subcutaneous emphysema. Areas of mid lower lung zone infiltrate  remain without change. No new opacity. Heart size normal. Pulmonary vascular within normal limits. No adenopathy. No bone lesions. IMPRESSION: No pneumothorax evident with chest tube unchanged in position. Extensive subcutaneous air bilaterally as well as pneumomediastinum evident. Areas of consolidation in the mid lower lung zones remain stable. No new opacity. No change in cardiac silhouette. Electronically Signed   By: Bretta BangWilliam  Woodruff III M.D.   On: 04/24/2017 21:49    STUDIES:  CT Chest 4/12 >> ETT 3 cm above carina, small R pneumothorax, indwelling chest tube, stable findings of sarcoidosis with superimposed emphysema CT Head 4/12 >> negative  ANTIBIOTICS:   SIGNIFICANT EVENTS: 4/12  Admit with AMS, pneumothorax s/p chest tube on R 4/13  Pt developed SQ emphysema, chest tube replaced 4/14  Suction increased on CT due to SQ emphysema / leak  4/15  Suction decreased to 20 cm   LINES/TUBES: R Chest tube 4/12 >> 4/13, 4/13 >>    DISCUSSION: 52 year old man with significant bullous emphysema and pneumothorax likely secondary to with rupture of latter.  Pneumothorax now appropriate controlled improving subcutaneous emphysema following replacement of chest tube.  ASSESSMENT / PLAN:  PULMONARY A: Right Pneumothorax with SQ Emphysema  Acute on Chronic Hypoxic Respiratory Failure - resolved, baseline 5L O2  Right Pneumothorax  Severe Bullous Emphysema  Sarcoidosis  OSA on CPAP - intermittently compliant  P:   Reduce suction to 20 cm on CT.  Monitor air leak.  CT site care daily and PRN  Pain control as below   CARDIOVASCULAR A:  Type II Demand Related MI - EKG changes with modest troponin rise, no new wall motion on ECHO Tachycardia  HFpEF  P:  Continue current management  SDU monitoring   RENAL A:   At Risk AKI - in setting of NSAID use  Mild Chronic Hypercarbia related to COPD  P:   Trend BMP / urinary output Replace electrolytes as indicated Avoid nephrotoxic  agents as able, ensure adequate renal perfusion  HEMATOLOGIC A:   Anemia of chronic disease.  P:  Trend CBC  Transfuse per ICU guidelines  Add SCD's + Heparin SQ for DVT prophylaxis   NEUROLOGIC A:   Pain P:   PRN IV dilaudid Q2 PRN PO dilaudid Q4 Add toradol 30 mg IV x1, then naproxen 500 mg BID x4 doses.  Monitor renal function closely  FAMILY  - Updates: No family at bedside am 4/15.  Patient updated on plan of care.   Canary BrimBrandi Adeeb Konecny, NP-C [facility] Pgr: [phone] or if no answer [phone] 04/25/2017, 1:39 PM

## 2017-04-26 ENCOUNTER — Inpatient Hospital Stay (HOSPITAL_COMMUNITY): Payer: 59

## 2017-04-26 LAB — BASIC METABOLIC PANEL
ANION GAP: 11 (ref 5–15)
BUN: 28 mg/dL — ABNORMAL HIGH (ref 6–20)
CALCIUM: 9.4 mg/dL (ref 8.9–10.3)
CO2: 31 mmol/L (ref 22–32)
CREATININE: 0.84 mg/dL (ref 0.61–1.24)
Chloride: 90 mmol/L — ABNORMAL LOW (ref 101–111)
GFR calc non Af Amer: >60 mL/min (ref >60)
Glucose, Bld: 111 mg/dL — ABNORMAL HIGH (ref 65–99)
Potassium: 4.6 mmol/L (ref 3.5–5.1)
SODIUM: 132 mmol/L — AB (ref 135–145)

## 2017-04-26 LAB — CBC
HCT: 39.5 % (ref 39.0–52.0)
HEMOGLOBIN: 12.2 g/dL — AB (ref 13.0–17.0)
MCH: 25.1 pg — ABNORMAL LOW (ref 26.0–34.0)
MCHC: 30.9 g/dL (ref 30.0–36.0)
MCV: 81.1 fL (ref 78.0–100.0)
Platelets: 367 10*3/uL (ref 150–400)
RBC: 4.87 MIL/uL (ref 4.22–5.81)
RDW: 13.5 % (ref 11.5–15.5)
WBC: 9.7 10*3/uL (ref 4.0–10.5)

## 2017-04-26 LAB — GLUCOSE, CAPILLARY: GLUCOSE-CAPILLARY: 102 mg/dL — AB (ref 65–99)

## 2017-04-26 MED ORDER — ENSURE ENLIVE PO LIQD
237.0000 mL | Freq: Two times a day (BID) | ORAL | Status: DC
  Administered 2017-04-26 – 2017-05-05 (×17): 237 mL via ORAL

## 2017-04-26 MED ORDER — ADULT MULTIVITAMIN W/MINERALS CH
1.0000 | ORAL_TABLET | Freq: Every day | ORAL | Status: DC
  Administered 2017-04-27 – 2017-05-11 (×13): 1 via ORAL
  Filled 2017-04-26 (×13): qty 1

## 2017-04-26 NOTE — Progress Notes (Signed)
Nutrition Follow-up  DOCUMENTATION CODES:   Not applicable  INTERVENTION:  - Will order Ensure Enlive BID, each supplement provides 350 kcal and 20 grams of protein. - Will order daily multivitamin with minerals.  - Continue to encourage PO intakes.   NUTRITION DIAGNOSIS:   Inadequate oral intake related to acute illness, decreased appetite as evidenced by per patient/family report. -revised, ongoing   GOAL:   Patient will meet greater than or equal to 90% of their needs -unmet at this time.   MONITOR:   PO intake, Supplement acceptance, Weight trends, Labs  ASSESSMENT:   52 year-old male who was d/c'ed on 4/11 and presented to the ED on 4/12 with shortness of breath. History of COPD and lung cancer. Mental status declined in route to the hospital. EMS reported he was speaking to them initially. He was intubated emergently.  Weight slightly down since admission weight (-3 lbs/1.4 kg). Pt was extubated on 4/12 and OGT removed at that time. Re-estimated nutrition needs this AM d/t this. Diet advanced from NPO to Heart Healthy on 4/12 at 5:35 PM. Per chart review, pt consumed 80% of lunch on 4/14 and 10% of breakfast on 4/15. Visualized breakfast tray with 100% completion of fresh fruit cup, Fruit Loops on tray but untouched/unopened. It was difficult to obtain information from pt but he does report having some pain (unable to specify where) and that he has been feeling nauseated. He also describes an episode of a pill causing esophageal burning/pain. He feels his appetite is "ok." Unable to obtain any other information at this time.  Per Brandi's note this AM: R pneumothorax with chest tube in place, severe emphysema, sarcoidosis, OSA on CPAP with intermittent compliance.   Medications reviewed. Labs reviewed; CBG: 102 mg/dL this AM, Na: 027132 mmol/L, Cl: 90 mmol/L, BUN: 28 mg/dL    Diet Order:  Diet Heart Room service appropriate? Yes; Fluid consistency: Thin  EDUCATION NEEDS:    No education needs have been identified at this time  Skin:  Skin Assessment: Reviewed RN Assessment  Last BM:  4/10  Height:   Ht Readings from Last 1 Encounters:  30-Aug-2017 5\' 7"  (1.702 m)    Weight:   Wt Readings from Last 1 Encounters:  04/25/17 134 lb 7.7 oz (61 kg)    Ideal Body Weight:  67.27 kg  BMI:  Body mass index is 21.06 kg/m.  Estimated Nutritional Needs:   Kcal:  1950-2135 (32-35 kcal/kg)  Protein:  75-90 grams (1.2-1.5 grams/kg)  Fluid:  >/= 1.8 L/day       Trenton GammonJessica Keauna Brasel, MS, RD, LDN, [facility] Inpatient Clinical Dietitian Pager # [phone] After hours/weekend pager # [phone]

## 2017-04-26 NOTE — Progress Notes (Signed)
PULMONARY / CRITICAL CARE MEDICINE   Name: [name] MRN: [id number] DOB: 1965-10-07    ADMISSION DATE:  05/07/2017 CONSULTATION DATE: 04/23/2017  REFERRING MD:  Manus Gunning, MD [name]  CHIEF COMPLAINT: Increased dyspnea  BRIEF SUMMARY:   52 yr old male, smoker, chronic cocaine/opiate abuse, with PMH of severe obstructive lung disease, burnt out sarcoid with large bullae, OSA on CPAP, chronic diastolic CHF, recently discharged on 04/21/17 presents on 4/12 s/p cocaine use with dyspnea and chest pain now intubated on mechanical ventilation s/p R chest tube for pneumothorax.  Developed subcutaneous emphysema 4/13 when chest tube migrated out of thoracic cavity. Improved when new tube placed.     SUBJECTIVE:  Pt reports pain improved with toradol / aleve.  Reports nausea this am.    VITAL SIGNS: BP (!) 166/112 (BP Location: Right Arm)   Pulse 100   Temp 97.8 F (36.6 C) (Oral)   Resp 15   Ht 5\' 7"  (1.702 m)   Wt 134 lb 7.7 oz (61 kg)   SpO2 100%   BMI 21.06 kg/m    VENTILATOR SETTINGS:  FiO2 (%):  [9 %] 9 %  INTAKE / OUTPUT: I/O last 3 completed shifts: In: -  Out: 1761 [Urine:1725; Chest Tube:36]  PHYSICAL EXAMINATION: General: chronically ill appearing male in NAD, sitting up at bedside HEENT: MM pink/moist, no jvd, sub-q air palpated in right shoulder/nec Neuro: AAOx4, speech clear, MAE CV: s1s2 rrr, no m/r/g PULM: even/non-labored, lungs bilaterally diminished with faint wheezing, right chest tube with variable air leak 1/7 or none.   GI: soft, non-tender, bsx4 active  Extremities: warm/dry, no edema  Skin: no rashes or lesions   LABS:  BMET Recent Labs  Lab 04/23/17 0341 04/25/17 0308 04/26/17 0352  NA 136 133* 132*  K 4.0 4.7 4.6  CL 93* 90* 90*  CO2 32 30 31  BUN 27* 32* 28*  CREATININE 0.77 1.18 0.84  GLUCOSE 96 109* 111*    Electrolytes Recent Labs  Lab 04/26/2017 0513 04/23/17 0341 04/25/17 0308 04/26/17 0352  CALCIUM 9.4 8.7* 9.6 9.4   MG 2.3  --   --   --   PHOS 2.9  --   --   --     CBC Recent Labs  Lab 04/23/17 0341 04/25/17 0308 04/26/17 0352  WBC 10.6* 13.5* 9.7  HGB 11.1* 13.7 12.2*  HCT 36.3* 45.3 39.5  PLT 360 504* 367    Coag's Recent Labs  Lab 04/17/2017 0057  INR 0.91    Sepsis Markers Recent Labs  Lab 04/21/2017 0127 04/17/2017 0513 04/23/17 0341 04/24/17 0326  LATICACIDVEN 3.23*  --   --   --   PROCALCITON  --  0.21 0.21 0.17    ABG Recent Labs  Lab 04/24/2017 0123 05/01/2017 0345  PHART 7.370 7.472*  PCO2ART 55.1* 40.1  PO2ART 337* 142*    Liver Enzymes Recent Labs  Lab 04/29/2017 0057  AST 38  ALT 31  ALKPHOS 80  BILITOT 0.6  ALBUMIN 4.0    Cardiac Enzymes Recent Labs  Lab 04/21/2017 0513 04/21/2017 0939 05/04/2017 1533  TROPONINI 0.04* 0.04* 0.03*    Glucose Recent Labs  Lab 04/25/17 0803 04/25/17 1152 04/25/17 1557 04/25/17 1930 04/25/17 2313 04/26/17 0334  GLUCAP 78 101* 105* 98 84 102*    Imaging Dg Chest Port 1 View  Result Date: 04/26/2017 CLINICAL DATA:  Pneumothorax EXAM: PORTABLE CHEST 1 VIEW COMPARISON:  04/25/2017 FINDINGS: Right chest tube remains in place. Small right  apical pneumothorax less than 10%. Diffuse subcutaneous emphysema unchanged. Bilateral airspace disease unchanged. Pulmonary hyperinflation. Small pleural effusions bilaterally. IMPRESSION: Small right apical pneumothorax. Diffuse bilateral airspace disease unchanged. Electronically Signed   By: Charles  ClarMarlan Palauk M.D.   On: 04/26/2017 07:03    STUDIES:  CT Chest 4/12 >> ETT 3 cm above carina, small R pneumothorax, indwelling chest tube, stable findings of sarcoidosis with superimposed emphysema CT Head 4/12 >> negative  ANTIBIOTICS:   SIGNIFICANT EVENTS: 4/12  Admit with AMS, pneumothorax s/p chest tube on R 4/13  Pt developed SQ emphysema, chest tube replaced 4/14  Suction increased on CT due to SQ emphysema / leak  4/15  Suction decreased to 20 cm  4/16  Residual 10% ptx, remains  on 20 cm suction  LINES/TUBES: R Chest tube 4/12 >> 4/13, 4/13 >>    DISCUSSION: 52 year old man with significant bullous emphysema and pneumothorax likely secondary to with rupture of latter.  Pneumothorax now appropriate controlled improving subcutaneous emphysema following replacement of chest tube.  ASSESSMENT / PLAN:  PULMONARY A: Right Pneumothorax with SQ Emphysema  Acute on Chronic Hypoxic Respiratory Failure - resolved, baseline 5L O2  Right Pneumothorax  Severe Bullous Emphysema  Sarcoidosis  OSA on CPAP - intermittently compliant  P:   Continue suction 20 cm  Monitor air leak  Chest tube site care daily & PRN  Pain control  Continue baseline O2 Once no air leak, consider water seal  CARDIOVASCULAR A:  Type II Demand Related MI - EKG changes with modest troponin rise, no new wall motion on ECHO Tachycardia  HFpEF  P:  SDU monitoring  Continue current management.  No acute interventions.   RENAL A:   At Risk AKI - in setting of NSAID use  Mild Chronic Hypercarbia related to COPD  P:   Trend BMP / urinary output Replace electrolytes as indicated Avoid nephrotoxic agents, ensure adequate renal perfusion  HEMATOLOGIC A:   Anemia of chronic disease.  P:  Trend CBC  Transfuse per ICU guidelines  SCD's + Heparin for DVT prophylaxis   NEUROLOGIC A:   Pain secondary to chest tube  P:   PRN PO dilaudid (use first) + IV PRN for severe pain  Continue Naproxen BID x4 doses total  Monitor renal function with NSAIDS   FAMILY  - Updates: Patient and fiancee updated on plan of care at bedside   Canary BrimBrandi Jeanclaude Wentworth, NP-C [facility] Pgr: [phone] or if no answer [phone] 04/26/2017, 9:41 AM

## 2017-04-26 NOTE — Progress Notes (Signed)
Chest tube dressing changed at bedside.  Vaseline gauze applied to site.  Dry drain sponges applied.  Taped in position.     Canary BrimBrandi Mariah Gerstenberger, NP-C [facility] Pgr: [phone] or if no answer [phone] 04/26/2017, 1:01 PM

## 2017-04-27 ENCOUNTER — Inpatient Hospital Stay (HOSPITAL_COMMUNITY): Payer: 59

## 2017-04-27 DIAGNOSIS — J439 Emphysema, unspecified: Secondary | ICD-10-CM

## 2017-04-27 DIAGNOSIS — J9312 Secondary spontaneous pneumothorax: Secondary | ICD-10-CM

## 2017-04-27 LAB — BASIC METABOLIC PANEL
Anion gap: 14 (ref 5–15)
BUN: 20 mg/dL (ref 6–20)
CALCIUM: 9.7 mg/dL (ref 8.9–10.3)
CO2: 32 mmol/L (ref 22–32)
Chloride: 90 mmol/L — ABNORMAL LOW (ref 101–111)
Creatinine, Ser: 0.76 mg/dL (ref 0.61–1.24)
GFR calc Af Amer: >60 mL/min (ref >60)
GLUCOSE: 107 mg/dL — AB (ref 65–99)
POTASSIUM: 4.6 mmol/L (ref 3.5–5.1)
Sodium: 136 mmol/L (ref 135–145)

## 2017-04-27 LAB — CBC
HCT: 41.6 % (ref 39.0–52.0)
Hemoglobin: 13 g/dL (ref 13.0–17.0)
MCH: 25.2 pg — AB (ref 26.0–34.0)
MCHC: 31.3 g/dL (ref 30.0–36.0)
MCV: 80.8 fL (ref 78.0–100.0)
Platelets: 333 10*3/uL (ref 150–400)
RBC: 5.15 MIL/uL (ref 4.22–5.81)
RDW: 13.5 % (ref 11.5–15.5)
WBC: 7.6 10*3/uL (ref 4.0–10.5)

## 2017-04-27 LAB — MRSA PCR SCREENING: MRSA by PCR: POSITIVE — AB

## 2017-04-27 MED ORDER — SENNOSIDES-DOCUSATE SODIUM 8.6-50 MG PO TABS
2.0000 | ORAL_TABLET | Freq: Every day | ORAL | Status: DC
  Administered 2017-04-27 – 2017-05-09 (×10): 2 via ORAL
  Filled 2017-04-27 (×12): qty 2

## 2017-04-27 MED ORDER — HYDROXYZINE HCL 25 MG PO TABS
50.0000 mg | ORAL_TABLET | Freq: Every day | ORAL | Status: DC
  Administered 2017-04-27 – 2017-05-06 (×10): 50 mg via ORAL
  Filled 2017-04-27 (×10): qty 2

## 2017-04-27 MED ORDER — ALBUTEROL SULFATE HFA 108 (90 BASE) MCG/ACT IN AERS: 2.0000 | INHALATION_SPRAY | Freq: Four times a day (QID) | RESPIRATORY_TRACT | Status: DC | PRN

## 2017-04-27 MED ORDER — POLYETHYLENE GLYCOL 3350 17 G PO PACK
17.0000 g | PACK | Freq: Two times a day (BID) | ORAL | Status: DC | PRN
  Administered 2017-04-29 – 2017-05-11 (×4): 17 g via ORAL
  Filled 2017-04-27 (×4): qty 1

## 2017-04-27 MED ORDER — MOMETASONE FURO-FORMOTEROL FUM 200-5 MCG/ACT IN AERO
2.0000 | INHALATION_SPRAY | Freq: Two times a day (BID) | RESPIRATORY_TRACT | Status: DC
  Administered 2017-04-27 – 2017-05-03 (×10): 2 via RESPIRATORY_TRACT
  Filled 2017-04-27: qty 8.8

## 2017-04-27 MED ORDER — ALBUTEROL SULFATE (2.5 MG/3ML) 0.083% IN NEBU
2.5000 mg | INHALATION_SOLUTION | RESPIRATORY_TRACT | Status: DC | PRN
  Administered 2017-04-29: 2.5 mg via RESPIRATORY_TRACT
  Filled 2017-04-27: qty 3

## 2017-04-27 MED ORDER — TIOTROPIUM BROMIDE MONOHYDRATE 18 MCG IN CAPS
18.0000 ug | ORAL_CAPSULE | Freq: Every day | RESPIRATORY_TRACT | Status: DC
  Administered 2017-04-27 – 2017-05-02 (×5): 18 ug via RESPIRATORY_TRACT
  Filled 2017-04-27 (×2): qty 5

## 2017-04-27 MED ORDER — DOCUSATE SODIUM 100 MG PO CAPS
100.0000 mg | ORAL_CAPSULE | Freq: Two times a day (BID) | ORAL | Status: DC
  Administered 2017-04-27 – 2017-05-11 (×24): 100 mg via ORAL
  Filled 2017-04-27 (×24): qty 1

## 2017-04-27 NOTE — Progress Notes (Signed)
PULMONARY / CRITICAL CARE MEDICINE   Name: [name] MRN: [id number] DOB: 07/05/1965    ADMISSION DATE:  01/05/2018 CONSULTATION DATE: 2017-08-05  REFERRING MD:  Manus Gunningancour, [facility]  CHIEF COMPLAINT: Increased dyspnea  BRIEF SUMMARY:   52 yr old male, smoker, chronic cocaine/opiate abuse, with PMH of severe obstructive lung disease, burnt out sarcoid with large bullae, OSA on CPAP, chronic diastolic CHF, recently discharged on 04/21/17 presents on 4/12 s/p cocaine use with dyspnea and chest pain now intubated on mechanical ventilation s/p R chest tube for pneumothorax.  Developed subcutaneous emphysema 4/13 when chest tube migrated out of thoracic cavity. Improved when new tube placed.     SUBJECTIVE:  Pt reports pain improved with toradol / aleve.  Reports nausea this am.    VITAL SIGNS: BP (!) 149/108   Pulse (!) 123   Temp 98.4 F (36.9 C) (Oral)   Resp 20   Ht 5\' 7"  (1.702 m)   Wt 133 lb 9.6 oz (60.6 kg)   SpO2 99%   BMI 20.92 kg/m    VENTILATOR SETTINGS:     INTAKE / OUTPUT: I/O last 3 completed shifts: In: -  Out: 3125 [Urine:3095; Chest Tube:30]  PHYSICAL EXAMINATION: General: chronically ill appearing male in NAD, sitting up at bedside HEENT: MM pink/moist, no jvd, sub-q air palpated in right shoulder/nec Neuro: AAOx4, speech clear, MAE CV: s1s2 rrr, no m/r/g PULM: even/non-labored, lungs bilaterally wheezing, right chest tube with no air leak.  GI: soft, non-tender, bsx4 active  Extremities: warm/dry, no edema  Skin: no rashes or lesions   LABS:  BMET Recent Labs  Lab 04/25/17 0308 04/26/17 0352 04/27/17 0304  NA 133* 132* 136  K 4.7 4.6 4.6  CL 90* 90* 90*  CO2 30 31 32  BUN 32* 28* 20  CREATININE 1.18 0.84 0.76  GLUCOSE 109* 111* 107*    Electrolytes Recent Labs  Lab 09/17/2017 0513  04/25/17 0308 04/26/17 0352 04/27/17 0304  CALCIUM 9.4   < > 9.6 9.4 9.7  MG 2.3  --   --   --   --   PHOS 2.9  --   --   --   --    < > =  values in this interval not displayed.    CBC Recent Labs  Lab 04/25/17 0308 04/26/17 0352 04/27/17 0304  WBC 13.5* 9.7 7.6  HGB 13.7 12.2* 13.0  HCT 45.3 39.5 41.6  PLT 504* 367 333    Coag's Recent Labs  Lab 09/17/2017 0057  INR 0.91    Sepsis Markers Recent Labs  Lab 09/17/2017 0127 09/17/2017 0513 04/23/17 0341 04/24/17 0326  LATICACIDVEN 3.23*  --   --   --   PROCALCITON  --  0.21 0.21 0.17    ABG Recent Labs  Lab 09/17/2017 0123 09/17/2017 0345  PHART 7.370 7.472*  PCO2ART 55.1* 40.1  PO2ART 337* 142*    Liver Enzymes Recent Labs  Lab 09/17/2017 0057  AST 38  ALT 31  ALKPHOS 80  BILITOT 0.6  ALBUMIN 4.0    Cardiac Enzymes Recent Labs  Lab 09/17/2017 0513 09/17/2017 0939 09/17/2017 1533  TROPONINI 0.04* 0.04* 0.03*    Glucose Recent Labs  Lab 04/25/17 0803 04/25/17 1152 04/25/17 1557 04/25/17 1930 04/25/17 2313 04/26/17 0334  GLUCAP 78 101* 105* 98 84 102*    Imaging Dg Chest Port 1 View  Result Date: 04/27/2017 CLINICAL DATA:  Acute respiratory failure EXAM: PORTABLE CHEST 1 VIEW COMPARISON:  04/26/2017  FINDINGS: Cardiac shadow is within normal limits. Patchy infiltrates are again seen throughout both lungs. Considerable subcutaneous emphysema is noted. Stable right pneumothorax is seen with chest tube in place. Proximal side port lies at the margin of the ribcage. No bony abnormality IMPRESSION: Stable right-sided pneumothorax. Bilateral infiltrates stable from the prior exam. Electronically Signed   By: Alcide Clever M.D.   On: 04/27/2017 07:27    STUDIES:  CT Chest 4/12 >> ETT 3 cm above carina, small R pneumothorax, indwelling chest tube, stable findings of sarcoidosis with superimposed emphysema CT Head 4/12 >> negative  ANTIBIOTICS:   SIGNIFICANT EVENTS: 4/12  Admit with AMS, pneumothorax s/p chest tube on R 4/13  Pt developed SQ emphysema, chest tube replaced 4/14  Suction increased on CT due to SQ emphysema / leak  4/15  Suction  decreased to 20 cm  4/16  Residual 10% ptx, remains on 20 cm suction  LINES/TUBES: R Chest tube 4/12 >> 4/13, 4/13 >>    DISCUSSION: 52 year old man with significant bullous emphysema and pneumothorax likely secondary to with rupture of latter.  Pneumothorax now appropriate controlled improving subcutaneous emphysema following replacement of chest tube.  ASSESSMENT / PLAN:  PULMONARY A: Right Pneumothorax with SQ Emphysema  Acute on Chronic Hypoxic Respiratory Failure - resolved, baseline 5L O2  Right Pneumothorax  Severe Bullous Emphysema  Sarcoidosis  OSA on CPAP - intermittently compliant, not using here to avoid worsen pneumothorax.  P:   Drop suction to -10 cm  Monitor air leak  Chest tube site care daily & PRN  Pain control  Continue baseline O2 Intensify baseline COPD regimen.  CARDIOVASCULAR A:  Type II Demand Related MI - EKG changes with modest troponin rise, no new wall motion on ECHO Tachycardia  HFpEF  P:  SDU monitoring  Continue current management.  No acute interventions.   RENAL A:   At Risk AKI - in setting of NSAID use  Mild Chronic Hypercarbia related to COPD  P:   Trend BMP / urinary output Replace electrolytes as indicated Avoid nephrotoxic agents, ensure adequate renal perfusion  HEMATOLOGIC A:   Anemia of chronic disease.  P:  Trend CBC  Transfuse per ICU guidelines  SCD's + Heparin for DVT prophylaxis   NEUROLOGIC A:   Pain secondary to chest tube  P:   PRN PO dilaudid (use first) + IV PRN for severe pain  Continue Naproxen BID x4 doses total  Monitor renal function with NSAIDS  Hydroxyzine for nausea and sleep.  FAMILY  - Updates: Patient and fiancee updated on plan of care at bedside   Lynnell Catalan, MD [facility] Pgr: [phone] or if no answer [phone] 04/27/2017, 12:53 PM

## 2017-04-28 ENCOUNTER — Inpatient Hospital Stay (HOSPITAL_COMMUNITY): Payer: 59

## 2017-04-28 DIAGNOSIS — D638 Anemia in other chronic diseases classified elsewhere: Secondary | ICD-10-CM

## 2017-04-28 DIAGNOSIS — I21A1 Myocardial infarction type 2: Secondary | ICD-10-CM

## 2017-04-28 DIAGNOSIS — R000 Tachycardia, unspecified: Secondary | ICD-10-CM

## 2017-04-28 MED ORDER — ALBUTEROL SULFATE (2.5 MG/3ML) 0.083% IN NEBU
2.5000 mg | INHALATION_SOLUTION | RESPIRATORY_TRACT | Status: DC
  Administered 2017-04-28 – 2017-04-29 (×5): 2.5 mg via RESPIRATORY_TRACT
  Filled 2017-04-28 (×6): qty 3

## 2017-04-28 MED ORDER — KETOROLAC TROMETHAMINE 15 MG/ML IJ SOLN
15.0000 mg | Freq: Four times a day (QID) | INTRAMUSCULAR | Status: AC | PRN
  Administered 2017-04-28 – 2017-04-30 (×3): 15 mg via INTRAVENOUS
  Filled 2017-04-28 (×3): qty 1

## 2017-04-28 MED ORDER — MUPIROCIN 2 % EX OINT
1.0000 "application " | TOPICAL_OINTMENT | Freq: Two times a day (BID) | CUTANEOUS | Status: AC
  Administered 2017-04-28 – 2017-05-02 (×10): 1 via NASAL
  Filled 2017-04-28: qty 22

## 2017-04-28 NOTE — Progress Notes (Signed)
PULMONARY / CRITICAL CARE MEDICINE   Name: [name] MRN: [id number] DOB: 03/06/1965    ADMISSION DATE:  05/01/2017 CONSULTATION DATE: 04/25/2017  REFERRING MD:  Manus Gunningancour, MD [facility]  CHIEF COMPLAINT: Increased dyspnea  BRIEF SUMMARY:   52 yr old male, smoker, chronic cocaine/opiate abuse, with PMH of severe obstructive lung disease, burnt out sarcoid with large bullae, OSA on CPAP, chronic diastolic CHF, recently discharged on 04/21/17 presents on 4/12 s/p cocaine use with dyspnea and chest pain now intubated on mechanical ventilation s/p R chest tube for pneumothorax.  Developed subcutaneous emphysema 4/13 when chest tube migrated out of thoracic cavity. Improved when new tube placed.     SUBJECTIVE:  Pt reports feeling better.  Wants to go home but understands he is not ready.  Ongoing pain but tolerating with pain medications.     VITAL SIGNS: BP 131/88   Pulse 100   Temp 98.7 F (37.1 C) (Oral)   Resp 18   Ht 5\' 7"  (1.702 m)   Wt 133 lb 9.6 oz (60.6 kg)   SpO2 100%   BMI 20.92 kg/m    VENTILATOR SETTINGS:     INTAKE / OUTPUT: I/O last 3 completed shifts: In: 240 [P.O.:240] Out: 2302 [Urine:2245; Chest Tube:57]  PHYSICAL EXAMINATION: General: thin adult male in NAD, sitting up in chiar  HEENT: MM pink/moist, no jvd, reduced SQ air PSY: calm/appropriate Neuro: AAOx4, speech clear, MAE  CV: s1s2 rrr, no m/r/g PULM: even/non-labored, lungs bilaterally diminished with exp wheezing, R CT c/d/i. Respiratory tidal noted, occasional intermittent mild leak.  Improved SQ air.  VQ:QVZDGI:soft, non-tender, bsx4 active  Extremities: warm/dry, no edema  Skin: no rashes or lesions  LABS:  BMET Recent Labs  Lab 04/25/17 0308 04/26/17 0352 04/27/17 0304  NA 133* 132* 136  K 4.7 4.6 4.6  CL 90* 90* 90*  CO2 30 31 32  BUN 32* 28* 20  CREATININE 1.18 0.84 0.76  GLUCOSE 109* 111* 107*    Electrolytes Recent Labs  Lab 04/29/2017 0513  04/25/17 0308 04/26/17 0352  04/27/17 0304  CALCIUM 9.4   < > 9.6 9.4 9.7  MG 2.3  --   --   --   --   PHOS 2.9  --   --   --   --    < > = values in this interval not displayed.    CBC Recent Labs  Lab 04/25/17 0308 04/26/17 0352 04/27/17 0304  WBC 13.5* 9.7 7.6  HGB 13.7 12.2* 13.0  HCT 45.3 39.5 41.6  PLT 504* 367 333    Coag's Recent Labs  Lab 04/21/2017 0057  INR 0.91    Sepsis Markers Recent Labs  Lab 04/16/2017 0127 05/10/2017 0513 04/23/17 0341 04/24/17 0326  LATICACIDVEN 3.23*  --   --   --   PROCALCITON  --  0.21 0.21 0.17    ABG Recent Labs  Lab 04/20/2017 0123 05/03/2017 0345  PHART 7.370 7.472*  PCO2ART 55.1* 40.1  PO2ART 337* 142*    Liver Enzymes Recent Labs  Lab 04/20/2017 0057  AST 38  ALT 31  ALKPHOS 80  BILITOT 0.6  ALBUMIN 4.0    Cardiac Enzymes Recent Labs  Lab 05/06/2017 0513 04/25/2017 0939 04/11/2017 1533  TROPONINI 0.04* 0.04* 0.03*    Glucose Recent Labs  Lab 04/25/17 0803 04/25/17 1152 04/25/17 1557 04/25/17 1930 04/25/17 2313 04/26/17 0334  GLUCAP 78 101* 105* 98 84 102*    Imaging No results found.  STUDIES:  CT Chest 4/12 >> ETT 3 cm above carina, small R pneumothorax, indwelling chest tube, stable findings of sarcoidosis with superimposed emphysema CT Head 4/12 >> negative  ANTIBIOTICS:   SIGNIFICANT EVENTS: 4/12  Admit with AMS, pneumothorax s/p chest tube on R 4/13  Pt developed SQ emphysema, chest tube replaced 4/14  Suction increased on CT due to SQ emphysema / leak  4/15  Suction decreased to 20 cm  4/16  Residual 10% ptx, remains on 20 cm suction 4/18  Respiratory variant on CT, minimal intermittent leak  LINES/TUBES: R Chest tube 4/12 >> 4/13, 4/13 >>    DISCUSSION: 52 year old man with significant bullous emphysema and pneumothorax likely secondary to with rupture of latter.  Pneumothorax now appropriate controlled improving subcutaneous emphysema following replacement of chest tube.  ASSESSMENT /  PLAN:  PULMONARY A: Right Pneumothorax with SQ Emphysema  Acute on Chronic Hypoxic Respiratory Failure - resolved, baseline 5L O2  Right Pneumothorax  Severe Bullous Emphysema  Sarcoidosis  OSA on CPAP - intermittently compliant  P:   Consider trial of  Chest tube site care daily & PRN  Pain control  O2 at baseline Follow intermittent CXR   CARDIOVASCULAR A:  Type II Demand Related MI - EKG changes with modest troponin rise, no new wall motion on ECHO Tachycardia  HFpEF  P:  SDU monitoring  Continue current management   RENAL A:   At Risk AKI - in setting of NSAID use  Mild Chronic Hypercarbia related to COPD  P:   Trend BMP / urinary output Replace electrolytes as indicated Avoid nephrotoxic agents, ensure adequate renal perfusion  HEMATOLOGIC A:   Anemia of chronic disease.  P:  Trend CBC  SCD's + Heparin for DVT prophylaxis   NEUROLOGIC A:   Pain secondary to chest tube  P:   PRN PO dilaudid + IV for severe pain   FAMILY  - Updates: Patient updated on plan of care at bedside am 4/18.   Canary Brim, NP-C [facility] Pgr: [phone] or if no answer [phone] 04/28/2017, 8:12 AM

## 2017-04-29 ENCOUNTER — Inpatient Hospital Stay (HOSPITAL_COMMUNITY): Payer: 59

## 2017-04-29 LAB — CBC
HCT: 38.8 % — ABNORMAL LOW (ref 39.0–52.0)
HEMOGLOBIN: 11.9 g/dL — AB (ref 13.0–17.0)
MCH: 25.2 pg — AB (ref 26.0–34.0)
MCHC: 30.7 g/dL (ref 30.0–36.0)
MCV: 82 fL (ref 78.0–100.0)
Platelets: 292 10*3/uL (ref 150–400)
RBC: 4.73 MIL/uL (ref 4.22–5.81)
RDW: 14 % (ref 11.5–15.5)
WBC: 5.6 10*3/uL (ref 4.0–10.5)

## 2017-04-29 LAB — BASIC METABOLIC PANEL
Anion gap: 12 (ref 5–15)
BUN: 15 mg/dL (ref 6–20)
CHLORIDE: 93 mmol/L — AB (ref 101–111)
CO2: 32 mmol/L (ref 22–32)
CREATININE: 0.77 mg/dL (ref 0.61–1.24)
Calcium: 9.9 mg/dL (ref 8.9–10.3)
GFR calc non Af Amer: >60 mL/min (ref >60)
Glucose, Bld: 89 mg/dL (ref 65–99)
Potassium: 5.1 mmol/L (ref 3.5–5.1)
SODIUM: 137 mmol/L (ref 135–145)

## 2017-04-29 MED ORDER — ALBUTEROL SULFATE (2.5 MG/3ML) 0.083% IN NEBU
2.5000 mg | INHALATION_SOLUTION | Freq: Four times a day (QID) | RESPIRATORY_TRACT | Status: DC
  Administered 2017-04-29 – 2017-04-30 (×3): 2.5 mg via RESPIRATORY_TRACT
  Filled 2017-04-29 (×3): qty 3

## 2017-04-29 NOTE — Progress Notes (Addendum)
PULMONARY / CRITICAL CARE MEDICINE   Name: [name] MRN: [id number] DOB: 12/22/1965    ADMISSION DATE:  04/13/2017 CONSULTATION DATE: 04/27/2017  REFERRING MD:  Manus Gunningancour, MD [facility]  CHIEF COMPLAINT: Increased dyspnea  BRIEF SUMMARY:   52 yr old male, smoker, chronic cocaine/opiate abuse, with PMH of severe obstructive lung disease, burnt out sarcoid with large bullae, OSA on CPAP, chronic diastolic CHF, recently discharged on 04/21/17 presented on 4/12 s/p cocaine use with dyspnea and chest pain,intubated on mechanical ventilation s/p R chest tube for pneumothorax.  Developed subcutaneous emphysema 4/13 when chest tube migrated out of thoracic cavity. Improved when new tube placed.     SUBJECTIVE:   Denies pain oob to chair afebrile    VITAL SIGNS: BP (!) 125/101   Pulse 99   Temp 98.2 F (36.8 C) (Oral)   Resp 17   Ht 5\' 7"  (1.702 m)   Wt 133 lb 9.6 oz (60.6 kg)   SpO2 94%   BMI 20.92 kg/m    VENTILATOR SETTINGS:     INTAKE / OUTPUT: I/O last 3 completed shifts: In: 240 [P.O.:240] Out: 2310 [Urine:2275; Chest Tube:35]  PHYSICAL EXAMINATION: General: thin adult male in NAD, sitting up in chiar  HEENT: MM pink/moist, no jvd, reduced SQ air PSY: calm/appropriate Neuro: AAOx4, speech clear, MAE  CV: s1s2 rrr, no m/r/g PULM: even/non-labored, lungs bilaterally diminished with exp wheezing, R CT with air leak, moves with resp,  Improved SQ crepitus ZD:GUYQGI:soft, non-tender, bsx4 active  Extremities: warm/dry, no edema  Skin: no rashes or lesions  LABS:  BMET Recent Labs  Lab 04/26/17 0352 04/27/17 0304 04/29/17 0342  NA 132* 136 137  K 4.6 4.6 5.1  CL 90* 90* 93*  CO2 31 32 32  BUN 28* 20 15  CREATININE 0.84 0.76 0.77  GLUCOSE 111* 107* 89    Electrolytes Recent Labs  Lab 04/26/17 0352 04/27/17 0304 04/29/17 0342  CALCIUM 9.4 9.7 9.9    CBC Recent Labs  Lab 04/26/17 0352 04/27/17 0304 04/29/17 0342  WBC 9.7 7.6 5.6  HGB 12.2* 13.0  11.9*  HCT 39.5 41.6 38.8*  PLT 367 333 292    Coag's No results for input(s): APTT, INR in the last 168 hours.  Sepsis Markers Recent Labs  Lab 04/23/17 0341 04/24/17 0326  PROCALCITON 0.21 0.17    ABG No results for input(s): PHART, PCO2ART, PO2ART in the last 168 hours.  Liver Enzymes No results for input(s): AST, ALT, ALKPHOS, BILITOT, ALBUMIN in the last 168 hours.  Cardiac Enzymes Recent Labs  Lab 04/15/2017 1533  TROPONINI 0.03*    Glucose Recent Labs  Lab 04/25/17 0803 04/25/17 1152 04/25/17 1557 04/25/17 1930 04/25/17 2313 04/26/17 0334  GLUCAP 78 101* 105* 98 84 102*    Imaging Dg Chest Port 1 View  Result Date: 04/29/2017 CLINICAL DATA:  Respiratory failure EXAM: PORTABLE CHEST 1 VIEW COMPARISON:  April 28, 2017 FINDINGS: Chest tube is again noted on the right with the side port slightly lateral to the pleural space on the right. There is a small apical and lateral pneumothorax on the right without appreciable tension component. There is extensive subcutaneous air on the right. There is consolidation in the right mid lung region with small right pleural effusion. There is consolidation throughout the left mid lower lung zones with small left pleural effusion. Heart size normal. Pulmonary vascularity appears stable and grossly unremarkable. No adenopathy is evident. No bone lesions. IMPRESSION: Chest tube present on the  right with side port laterally positioned, stable. Small pneumothorax on the right without tension component. Areas of consolidation in both lungs with small pleural effusions bilaterally. No new opacity. Stable cardiac silhouette. There remains extensive subcutaneous air, more severe on the right than on the left. Electronically Signed   By: Bretta Bang III M.D.   On: 04/29/2017 07:49   Dg Chest Port 1 View  Result Date: 04/28/2017 CLINICAL DATA:  Follow-up RIGHT pneumothorax. EXAM: PORTABLE CHEST 1 VIEW COMPARISON:  04/27/2017. FINDINGS:  A RIGHT thoracostomy tube is noted, with side hole now external to the ribs. A tiny RIGHT apical pneumothorax is noted, decreased. Diffuse RIGHT subcutaneous emphysema has slightly decreased. Bilateral and confluent opacities are again noted. No other significant change. IMPRESSION: 1. RIGHT thoracostomy tube, with side hole now external to the ribs. Decreased tiny RIGHT apical pneumothorax and slightly decreased diffuse RIGHT subcutaneous emphysema. 2. No other significant change. Electronically Signed   By: Harmon Pier M.D.   On: 04/28/2017 16:03    STUDIES:  CT Chest 4/12 >> ETT 3 cm above carina, small R pneumothorax, indwelling chest tube, stable findings of sarcoidosis with superimposed emphysema CT Head 4/12 >> negative  ANTIBIOTICS:   SIGNIFICANT EVENTS: 4/12  Admit with AMS, pneumothorax s/p chest tube on R 4/13  Pt developed SQ emphysema, chest tube replaced 4/14  Suction increased on CT due to SQ emphysema / leak  4/15  Suction decreased to 20 cm  4/16  Residual 10% ptx, remains on 20 cm suction 4/18  Respiratory variant on CT, minimal intermittent leak  LINES/TUBES: R Chest tube 4/12 >> 4/13, 4/13 >>    DISCUSSION: 52 year old man with significant bullous emphysema and pneumothorax likely secondary to with rupture of latter.  Pneumothorax now appropriate controlled improving subcutaneous emphysema following replacement of chest tube.  ASSESSMENT / PLAN:  PULMONARY A: Right Pneumothorax with SQ Emphysema  Acute on Chronic Hypoxic Respiratory Failure - resolved, baseline 5L O2  Severe Bullous Emphysema  Sarcoidosis  OSA on CPAP - intermittently compliant  P:   Ct chest tube to suction while air leak persists, although side bore appears out of pleural space on CXR Monitor SQ emphysema Chest tube site care daily & PRN  Pain control  O2 at baseline Follow intermittent CXR     Type II Demand Related MI - EKG changes with modest troponin rise, no new wall motion on  ECHO HFpEF  P:  SDU monitoring  Continue current management   Mild hyperkalemia - follow   Pain secondary to chest tube  P:   PRN PO dilaudid + IV for severe pain   FAMILY  - Updates: Wife updated on plan of care at bedside   Cyril Mourning MD. [facility]. [facility] Pager [phone] If no response call [phone]    04/29/2017, 11:03 AM

## 2017-04-30 ENCOUNTER — Inpatient Hospital Stay (HOSPITAL_COMMUNITY): Payer: 59

## 2017-04-30 LAB — EXPECTORATED SPUTUM ASSESSMENT W GRAM STAIN, RFLX TO RESP C

## 2017-04-30 LAB — EXPECTORATED SPUTUM ASSESSMENT W REFEX TO RESP CULTURE

## 2017-04-30 MED ORDER — BISACODYL 10 MG RE SUPP
10.0000 mg | Freq: Every day | RECTAL | Status: AC | PRN
  Administered 2017-04-30: 10 mg via RECTAL
  Filled 2017-04-30 (×2): qty 1

## 2017-04-30 MED ORDER — HYDROCORTISONE 1 % EX CREA
TOPICAL_CREAM | Freq: Three times a day (TID) | CUTANEOUS | Status: DC
  Administered 2017-04-30: 1 via TOPICAL
  Administered 2017-04-30 – 2017-05-03 (×10): via TOPICAL
  Administered 2017-05-04 – 2017-05-05 (×2): 1 via TOPICAL
  Administered 2017-05-06 – 2017-05-09 (×6): via TOPICAL
  Administered 2017-05-09: 1 via TOPICAL
  Administered 2017-05-09 – 2017-05-12 (×8): via TOPICAL
  Administered 2017-05-13: 1 via TOPICAL
  Administered 2017-05-13: 21:00:00 via TOPICAL
  Administered 2017-05-13: 1 via TOPICAL
  Administered 2017-05-14 – 2017-05-17 (×6): via TOPICAL
  Filled 2017-04-30 (×3): qty 28

## 2017-04-30 MED ORDER — DILTIAZEM HCL ER COATED BEADS 180 MG PO CP24
180.0000 mg | ORAL_CAPSULE | Freq: Every day | ORAL | Status: DC
  Administered 2017-04-30 – 2017-05-05 (×6): 180 mg via ORAL
  Filled 2017-04-30 (×6): qty 1

## 2017-04-30 MED ORDER — FUROSEMIDE 40 MG PO TABS
40.0000 mg | ORAL_TABLET | Freq: Every day | ORAL | Status: DC
  Administered 2017-04-30 – 2017-05-11 (×10): 40 mg via ORAL
  Filled 2017-04-30 (×10): qty 1

## 2017-04-30 MED ORDER — GUAIFENESIN-DM 100-10 MG/5ML PO SYRP
10.0000 mL | ORAL_SOLUTION | ORAL | Status: DC | PRN
  Administered 2017-04-30 – 2017-05-05 (×14): 10 mL via ORAL
  Filled 2017-04-30 (×14): qty 10

## 2017-04-30 MED ORDER — TAMSULOSIN HCL 0.4 MG PO CAPS
0.4000 mg | ORAL_CAPSULE | Freq: Every day | ORAL | Status: DC
  Administered 2017-04-30 – 2017-05-10 (×8): 0.4 mg via ORAL
  Filled 2017-04-30 (×10): qty 1

## 2017-04-30 MED ORDER — PREGABALIN 100 MG PO CAPS
200.0000 mg | ORAL_CAPSULE | Freq: Two times a day (BID) | ORAL | Status: DC
  Administered 2017-04-30 – 2017-05-04 (×10): 200 mg via ORAL
  Administered 2017-05-05: 100 mg via ORAL
  Administered 2017-05-05 – 2017-05-11 (×8): 200 mg via ORAL
  Filled 2017-04-30 (×20): qty 2

## 2017-04-30 MED ORDER — ALBUTEROL SULFATE (2.5 MG/3ML) 0.083% IN NEBU
2.5000 mg | INHALATION_SOLUTION | Freq: Four times a day (QID) | RESPIRATORY_TRACT | Status: DC | PRN
  Administered 2017-04-30 – 2017-05-03 (×9): 2.5 mg via RESPIRATORY_TRACT
  Filled 2017-04-30 (×10): qty 3

## 2017-04-30 MED ORDER — METOPROLOL TARTRATE 5 MG/5ML IV SOLN
5.0000 mg | Freq: Once | INTRAVENOUS | Status: AC
  Administered 2017-04-30: 5 mg via INTRAVENOUS
  Filled 2017-04-30: qty 5

## 2017-04-30 NOTE — Progress Notes (Signed)
HR sustaining 130's-140.  BP 124/80, R 24, T 98.7, 99% 4L oxygen.  Spoke with on call MD. Will give Lopressor 5mg  IV x one dose per MD order. RN to call on call MD with update at 1930.

## 2017-04-30 NOTE — Progress Notes (Signed)
PT Cancellation Note  Patient Details Name: [name] MRN: [id number] DOB: 06/03/1965   Cancelled Treatment:    Reason Eval/Treat Not Completed: Medical issues which prohibited therapy(HR 120s at rest)   [facility],Dijuan Sleeth 04/30/2017, 4:19 PM

## 2017-04-30 NOTE — Progress Notes (Signed)
eLink Physician-Brief Progress Note Patient Name: Andris Baumannommy Nanninga DOB: 07/28/1965 MRN: [id number]   Date of Service  04/30/2017  HPI/Events of Note  Patient c/o cough and requests cough medication.   eICU Interventions  Will order Robitussin DM 10 mL PO Q 4 hours PRN cough.      Intervention Category Major Interventions: Other:  Lenell AntuSommer,Steven Eugene 04/30/2017, 9:13 PM

## 2017-04-30 NOTE — Progress Notes (Signed)
PULMONARY / CRITICAL CARE MEDICINE   Name: [name] MRN: [id number] DOB: March 16, 1965    ADMISSION DATE:  04/15/2017 CONSULTATION DATE: 04/28/2017  REFERRING MD:  Manus Gunning, MD [name]  CHIEF COMPLAINT: Increased dyspnea  BRIEF SUMMARY:   52 yr old male, smoker, chronic cocaine/opiate abuse, with PMH of severe obstructive lung disease, burnt out sarcoid with large bullae, OSA on CPAP, chronic diastolic CHF, recently discharged on 04/21/17 presented on 4/12 s/p cocaine use with dyspnea and chest pain,intubated on mechanical ventilation s/p R chest tube for pneumothorax.  Developed subcutaneous emphysema 4/13 when chest tube migrated out of thoracic cavity. Improved when new tube placed.     SUBJECTIVE:   Denies pain C/o constipation oob to chair Cough with yellow sputum   VITAL SIGNS: BP (!) 97/50   Pulse 99   Temp 99.1 F (37.3 C) (Oral)   Resp 20   Ht 5\' 7"  (1.702 m)   Wt 129 lb 10.1 oz (58.8 kg)   SpO2 100%   BMI 20.30 kg/m    VENTILATOR SETTINGS:     INTAKE / OUTPUT: I/O last 3 completed shifts: In: -  Out: 855 [Urine:700; Chest Tube:155]  PHYSICAL EXAMINATION: General: thin adult male in NAD, up in chiar  HEENT: MM pink/moist, no jvd PSY: calm/appropriate Neuro: AAOx4, speech clear, MAE  CV: s1s2 rrr, no m/r/g PULM: even/non-labored, lungs bilaterally diminished , no  wheezing, R CT with decreased air leak, moves with resp,  Improved SQ crepitus JX:BJYN, non-tender, bsx4 active  Extremities: warm/dry, no edema  Skin: no rashes or lesions  LABS:  BMET Recent Labs  Lab 04/26/17 0352 04/27/17 0304 04/29/17 0342  NA 132* 136 137  K 4.6 4.6 5.1  CL 90* 90* 93*  CO2 31 32 32  BUN 28* 20 15  CREATININE 0.84 0.76 0.77  GLUCOSE 111* 107* 89    Electrolytes Recent Labs  Lab 04/26/17 0352 04/27/17 0304 04/29/17 0342  CALCIUM 9.4 9.7 9.9    CBC Recent Labs  Lab 04/26/17 0352 04/27/17 0304 04/29/17 0342  WBC 9.7 7.6 5.6  HGB 12.2* 13.0  11.9*  HCT 39.5 41.6 38.8*  PLT 367 333 292    Coag's No results for input(s): APTT, INR in the last 168 hours.  Sepsis Markers Recent Labs  Lab 04/24/17 0326  PROCALCITON 0.17    ABG No results for input(s): PHART, PCO2ART, PO2ART in the last 168 hours.  Liver Enzymes No results for input(s): AST, ALT, ALKPHOS, BILITOT, ALBUMIN in the last 168 hours.  Cardiac Enzymes No results for input(s): TROPONINI, PROBNP in the last 168 hours.  Glucose Recent Labs  Lab 04/25/17 0803 04/25/17 1152 04/25/17 1557 04/25/17 1930 04/25/17 2313 04/26/17 0334  GLUCAP 78 101* 105* 98 84 102*    Imaging Dg Chest Port 1 View  Result Date: 04/30/2017 CLINICAL DATA:  Acute respiratory failure. EXAM: PORTABLE CHEST 1 VIEW COMPARISON:  04/29/2017 FINDINGS: The cardiomediastinal silhouette is unchanged with known underlying bilateral hilar lymphadenopathy related to sarcoidosis. A right chest tube remains in place with side hole again projecting just outside the lateral costal margin. There is likely a persistent tiny right-sided pneumothorax though it is less conspicuous than on the prior study. Architectural distortion is again seen in both lungs with a large cavitary lesion noted on the right. Right midlung consolidation is unchanged. Consolidation in the left mid and lower lung has improved. There are persistent small bilateral pleural effusions. Extensive soft tissue emphysema is again seen throughout the  right chest wall and bilateral lower neck. IMPRESSION: 1. Improvement of left lung consolidation. 2. Unchanged right midlung consolidation and small pleural effusions. 3. Unchanged right chest tube, likely with a persistent tiny right pneumothorax. Electronically Signed   By: Sebastian AcheAllen  Grady M.D.   On: 04/30/2017 07:51  personally reviewed  STUDIES:  CT Chest 4/12 >> ETT 3 cm above carina, small R pneumothorax, indwelling chest tube, stable findings of sarcoidosis with superimposed emphysema CT  Head 4/12 >> negative  ANTIBIOTICS:   SIGNIFICANT EVENTS: 4/12  Admit with AMS, pneumothorax s/p chest tube on R 4/13  Pt developed SQ emphysema, chest tube replaced 4/14  Suction increased on CT due to SQ emphysema / leak  4/15  Suction decreased to 20 cm  4/16  Residual 10% ptx, remains on 20 cm suction 4/18  Respiratory variant on CT, minimal intermittent leak  LINES/TUBES: R Chest tube 4/12 >> 4/13, 4/13 >>    DISCUSSION: 52 year old man with significant bullous emphysema and pneumothorax likely secondary to with rupture of latter.  Pneumothorax  improving ,but still has small air leak  ASSESSMENT / PLAN:  PULMONARY A: Right Pneumothorax with SQ Emphysema  Acute on Chronic Hypoxic Respiratory Failure - resolved, baseline 5L O2  Severe Bullous Emphysema  Sarcoidosis  OSA on CPAP - intermittently compliant  P:   Ct chest tube to suction while air leak persists side bore appears out of pleural space on CXR - if comes out will dc Chest tube site care daily & PRN  Follow daily CXR  Ct dulera /spiriva Check sputum cx   Type II Demand Related MI - EKG changes with modest troponin rise, no new wall motion on ECHO HFpEF  P:  Resume home cardizem  Mild hyperkalemia - follow   Pain secondary to chest tube  P:   PRN PO dilaudid + IV for severe pain   Transfer to tele   Cyril Mourningakesh Vung Kush MD. FCCP. [facility] Pager [phone] If no response call [phone]    04/30/2017, 11:16 AM

## 2017-05-01 ENCOUNTER — Inpatient Hospital Stay (HOSPITAL_COMMUNITY): Payer: 59

## 2017-05-01 DIAGNOSIS — J439 Emphysema, unspecified: Secondary | ICD-10-CM

## 2017-05-01 MED ORDER — PREDNISONE 20 MG PO TABS
20.0000 mg | ORAL_TABLET | Freq: Every day | ORAL | Status: DC
  Administered 2017-05-01 – 2017-05-03 (×2): 20 mg via ORAL
  Filled 2017-05-01 (×3): qty 1

## 2017-05-01 NOTE — Progress Notes (Signed)
Patient complained of pain at and around chest tube site.  He also stated that if feels like it's burning.  Nurse called rapid nurse to come and reassess chest tube and settings. It appear to be functioning well.   Nurse notified  elink MD about this and stat chest xray was ordered and taken.  Patient has also been coughing most of the evening and night and robitussin was ordered and given.

## 2017-05-01 NOTE — Progress Notes (Signed)
PULMONARY / CRITICAL CARE MEDICINE   Name: [name] MRN: [id number] DOB: 30-Jan-1965    ADMISSION DATE:  05/05/2017 CONSULTATION DATE: 04/29/2017  REFERRING MD:  Manus Gunning, MD [name]  CHIEF COMPLAINT: Increased dyspnea  BRIEF SUMMARY:   52 yr old male, smoker, chronic cocaine/opiate abuse, with PMH of severe obstructive lung disease, burnt out sarcoid with large bullae, OSA on CPAP, chronic diastolic CHF, recently discharged on 04/21/17 presented on 4/12 s/p cocaine use with dyspnea and chest pain,intubated on mechanical ventilation s/p R chest tube for pneumothorax.  Developed subcutaneous emphysema 4/13 when chest tube migrated out of thoracic cavity. Improved when new tube placed.     SUBJECTIVE:   Denies pain Tachy  4/20  -improved with lopressor Cough with yellow sputum   VITAL SIGNS: BP 127/89 (BP Location: Left Arm)   Pulse (!) 108   Temp 99.2 F (37.3 C) (Oral)   Resp 19   Ht 5' 7.5" (1.715 m)   Wt 143 lb 8.3 oz (65.1 kg)   SpO2 (!) 89%   BMI 22.15 kg/m    VENTILATOR SETTINGS:     INTAKE / OUTPUT: I/O last 3 completed shifts: In: 730 [P.O.:730] Out: 525 [Urine:500; Chest Tube:25]  PHYSICAL EXAMINATION: General: thin adult male in NAD, up in chiar  HEENT: MM pink/moist, no jvd PSY: calm/appropriate Neuro: AAOx4, speech clear, MAE  CV: s1s2 rrr, no m/r/g PULM: even/non-labored, lungs bilaterally diminished , no  wheezing, R CT with decreased air leak, moves with resp, decreased SQ crepitus WU:JWJX, non-tender, bsx4 active  Extremities: warm/dry, no edema  Skin: no rashes or lesions  LABS:  BMET Recent Labs  Lab 04/26/17 0352 04/27/17 0304 04/29/17 0342  NA 132* 136 137  K 4.6 4.6 5.1  CL 90* 90* 93*  CO2 31 32 32  BUN 28* 20 15  CREATININE 0.84 0.76 0.77  GLUCOSE 111* 107* 89    Electrolytes Recent Labs  Lab 04/26/17 0352 04/27/17 0304 04/29/17 0342  CALCIUM 9.4 9.7 9.9    CBC Recent Labs  Lab 04/26/17 0352 04/27/17 0304  04/29/17 0342  WBC 9.7 7.6 5.6  HGB 12.2* 13.0 11.9*  HCT 39.5 41.6 38.8*  PLT 367 333 292    Coag's No results for input(s): APTT, INR in the last 168 hours.  Sepsis Markers No results for input(s): LATICACIDVEN, PROCALCITON, O2SATVEN in the last 168 hours.  ABG No results for input(s): PHART, PCO2ART, PO2ART in the last 168 hours.  Liver Enzymes No results for input(s): AST, ALT, ALKPHOS, BILITOT, ALBUMIN in the last 168 hours.  Cardiac Enzymes No results for input(s): TROPONINI, PROBNP in the last 168 hours.  Glucose Recent Labs  Lab 04/25/17 0803 04/25/17 1152 04/25/17 1557 04/25/17 1930 04/25/17 2313 04/26/17 0334  GLUCAP 78 101* 105* 98 84 102*    Imaging Dg Chest Port 1 View  Result Date: 05/01/2017 CLINICAL DATA:  Acute onset of generalized chest pain and burning sensation at chest tube site. EXAM: PORTABLE CHEST 1 VIEW COMPARISON:  Chest radiograph performed 04/30/2017 FINDINGS: The known tiny right apical pneumothorax is not well characterized. Known large underlying bulla are again seen. Changes of sarcoidosis are again noted, with cavitary lesions and areas of consolidation. Left-sided consolidation appears mildly worsened from prior studies, concerning for superimposed pneumonia. No significant pleural effusion is seen. The right apical chest tube is unchanged in appearance, with the side port just outside the ribs. Surrounding chest wall emphysema is unchanged in appearance. The cardiomediastinal silhouette is normal  in size. No acute osseous abnormalities are identified. IMPRESSION: 1. Mildly worsened left-sided airspace consolidation is concerning for superimposed pneumonia. 2. Changes of sarcoidosis again noted. Large underlying bulla again seen. 3. Known tiny right apical pneumothorax is not well characterized. Right apical chest tube is unchanged in appearance, with the side port just outside the ribs. Surrounding chest wall emphysema is unchanged in  appearance. Electronically Signed   By: Roanna RaiderJeffery  Chang M.D.   On: 05/01/2017 05:45  personally reviewed  STUDIES:  CT Chest 4/12 >> ETT 3 cm above carina, small R pneumothorax, indwelling chest tube, stable findings of sarcoidosis with superimposed emphysema CT Head 4/12 >> negative  ANTIBIOTICS:   SIGNIFICANT EVENTS: 4/12  Admit with AMS, pneumothorax s/p chest tube on R 4/13  Pt developed SQ emphysema, chest tube replaced 4/14  Suction increased on CT due to SQ emphysema / leak  4/15  Suction decreased to 20 cm  4/16  Residual 10% ptx, remains on 20 cm suction 4/18  Respiratory variant on CT, minimal intermittent leak  LINES/TUBES: R Chest tube 4/12 >> 4/13, 4/13 >>    DISCUSSION: 52 year old man with significant bullous emphysema and pneumothorax likely secondary to with rupture of latter.  Pneumothorax  improving ,but still has small air leak  ASSESSMENT / PLAN:  PULMONARY A: Right Pneumothorax with SQ Emphysema  Acute on Chronic Hypoxic Respiratory Failure - resolved, baseline 5L O2  Severe Bullous Emphysema  Sarcoidosis  OSA on CPAP - intermittently compliant  P:   chest tube to water seal , if CXR remains with no pnthorax on 4/22 ,can dc  side bore appears out of pleural space on CXR Chest tube site care daily & PRN  Follow daily CXR  Ct dulera /spiriva Check sputum cx   Type II Demand Related MI - EKG changes with modest troponin rise, no new wall motion on ECHO HFpEF  Sinus tach P:  Resumed home cardizem tele  Mild hyperkalemia - follow   Pain secondary to chest tube  P:   PRN PO dilaudid + IV for severe pain     Cyril Mourningakesh Alva MD. FCCP. [facility] Pager [phone] If no response call [phone]    05/01/2017, 1:24 PM

## 2017-05-01 NOTE — Progress Notes (Signed)
PT Cancellation Note  Patient Details Name: Andris Baumannommy Mcdougle MRN: [id number] DOB: 07/23/1965   Cancelled Treatment:    Reason Eval/Treat Not Completed: 2 attempts to work with pt. Pt refused participation with PT on today. Will check back another day.    Rebeca AlertJannie Kardell Virgil, MPT Pager: [phone]

## 2017-05-02 ENCOUNTER — Inpatient Hospital Stay (HOSPITAL_COMMUNITY): Payer: 59

## 2017-05-02 DIAGNOSIS — J441 Chronic obstructive pulmonary disease with (acute) exacerbation: Secondary | ICD-10-CM

## 2017-05-02 DIAGNOSIS — J9311 Primary spontaneous pneumothorax: Principal | ICD-10-CM

## 2017-05-02 LAB — CULTURE, RESPIRATORY W GRAM STAIN: Culture: NORMAL

## 2017-05-02 LAB — CULTURE, RESPIRATORY

## 2017-05-02 MED ORDER — SALINE SPRAY 0.65 % NA SOLN
1.0000 | NASAL | Status: DC | PRN
  Administered 2017-05-02 – 2017-05-14 (×3): 1 via NASAL
  Filled 2017-05-02 (×2): qty 44

## 2017-05-02 MED ORDER — BENZONATATE 100 MG PO CAPS
200.0000 mg | ORAL_CAPSULE | Freq: Three times a day (TID) | ORAL | Status: DC | PRN
  Administered 2017-05-02 – 2017-05-03 (×3): 200 mg via ORAL
  Filled 2017-05-02 (×3): qty 2

## 2017-05-02 NOTE — Progress Notes (Addendum)
PULMONARY / CRITICAL CARE MEDICINE   Name: [name] MRN: [id number] DOB: 1965-08-09    ADMISSION DATE:  04/13/2017 CONSULTATION DATE: 04/14/2017  REFERRING MD:  Manus Gunning, MD [name]  CHIEF COMPLAINT: Increased dyspnea  BRIEF SUMMARY:   52 yr old male, smoker, chronic cocaine/opiate abuse, with PMH of severe obstructive lung disease, burnt out sarcoid with large bullae, OSA on CPAP, chronic diastolic CHF, recently discharged on 04/21/17 presented on 4/12 s/p cocaine use with dyspnea and chest pain,intubated on mechanical ventilation s/p R chest tube for pneumothorax.  Developed subcutaneous emphysema 4/13 when chest tube migrated out of thoracic cavity. Improved when new tube placed.     SUBJECTIVE:   Chest tube placed to water seal yesterday.  Still some pain.    VITAL SIGNS: BP (!) 144/89 (BP Location: Right Arm)   Pulse 95   Temp 98.4 F (36.9 C) (Oral)   Resp 19   Ht 5' 7.5" (1.715 m)   Wt 64.4 kg (141 lb 15.6 oz)   SpO2 97%   BMI 21.91 kg/m    INTAKE / OUTPUT: I/O last 3 completed shifts: In: 970 [P.O.:970] Out: 1400 [Urine:1400]  PHYSICAL EXAMINATION: General: thin adult male in NAD, getting up to bedside commode  HEENT: MM pink/moist, no jvd Neuro: AAOx4, speech clear, MAE  CV: s1s2 rrr, no m/r/g PULM: even/non-labored on [place], lungs bilaterally diminished , no  wheezing, R CT with no obvious airleak, water seal, fluctuant with resp, decreased SQ crepitus WU:JWJX, non-tender, bsx4 active  Extremities: warm/dry, no edema  Skin: no rashes or lesions  LABS:  BMET Recent Labs  Lab 04/26/17 0352 04/27/17 0304 04/29/17 0342  NA 132* 136 137  K 4.6 4.6 5.1  CL 90* 90* 93*  CO2 31 32 32  BUN 28* 20 15  CREATININE 0.84 0.76 0.77  GLUCOSE 111* 107* 89    Electrolytes Recent Labs  Lab 04/26/17 0352 04/27/17 0304 04/29/17 0342  CALCIUM 9.4 9.7 9.9    CBC Recent Labs  Lab 04/26/17 0352 04/27/17 0304 04/29/17 0342  WBC 9.7 7.6 5.6  HGB  12.2* 13.0 11.9*  HCT 39.5 41.6 38.8*  PLT 367 333 292    Coag's No results for input(s): APTT, INR in the last 168 hours.  Sepsis Markers No results for input(s): LATICACIDVEN, PROCALCITON, O2SATVEN in the last 168 hours.  ABG No results for input(s): PHART, PCO2ART, PO2ART in the last 168 hours.  Liver Enzymes No results for input(s): AST, ALT, ALKPHOS, BILITOT, ALBUMIN in the last 168 hours.  Cardiac Enzymes No results for input(s): TROPONINI, PROBNP in the last 168 hours.  Glucose Recent Labs  Lab 04/25/17 1152 04/25/17 1557 04/25/17 1930 04/25/17 2313 04/26/17 0334  GLUCAP 101* 105* 98 84 102*    Imaging No results found.personally reviewed  STUDIES:  CT Chest 4/12 >> ETT 3 cm above carina, small R pneumothorax, indwelling chest tube, stable findings of sarcoidosis with superimposed emphysema CT Head 4/12 >> negative  ANTIBIOTICS:   SIGNIFICANT EVENTS: 4/12  Admit with AMS, pneumothorax s/p chest tube on R 4/13  Pt developed SQ emphysema, chest tube replaced 4/14  Suction increased on CT due to SQ emphysema / leak  4/15  Suction decreased to 20 cm  4/16  Residual 10% ptx, remains on 20 cm suction 4/18  Respiratory variant on CT, minimal intermittent leak  LINES/TUBES: R Chest tube 4/12 >> 4/13, 4/13 >>    DISCUSSION: 52 year old man with significant bullous emphysema and pneumothorax likely secondary  to with rupture of latter.  Pneumothorax  improving ,but still has small air leak  ASSESSMENT / PLAN:  PULMONARY A: Right Pneumothorax with SQ Emphysema  Acute on Chronic Hypoxic Respiratory Failure - resolved, baseline 5L O2  Severe Bullous Emphysema  Sarcoidosis  OSA on CPAP - intermittently compliant  P:   Chest tube to water seal 4/21  Stat CXR now to assess - if no ptx can likely d/c chest tube today  Chest tube site care daily & PRN  Follow daily CXR  Ct dulera /spiriva Check sputum cx   Type II Demand Related MI - EKG changes with modest  troponin rise, no new wall motion on ECHO HFpEF  Sinus tach P:  Resumed home cardizem, daily PO lasix  tele   Pain secondary to chest tube  P:   PRN PO dilaudid + IV for severe pain  toradol x 5 days    Dirk DressKaty Whiteheart, NP 05/02/2017  11:31 AM Pager: [phone] or [phone]  Attending Note:  52 year old male smoker and cocaine abuser presenting with respiratory failure with subsequent PTX with CT in place.  On exam, decreased BS diffusely.  I reviewed CXR myself, CT is in place but last port is outside the chest cavity.  No leak on exam.  Discussed with PCCM-NP.  PTX:  - CT to waterseal  - Stat CXR today  - If no leak by AM and lungs are up will consider removal of CT  COPD:  - BD as ordered  - No steroids  Hypoxemia:  - Titrate O2 for sat of 88-92%  Substance abuse:  - Councill upon discharge.  PCCM will continue to follow.  Patient seen and examined, agree with above note.  I dictated the care and orders written for this patient under my direction.  Alyson ReedyYacoub, Gustabo Gordillo G, MD [phone]

## 2017-05-02 NOTE — Evaluation (Signed)
Physical Therapy Evaluation Patient Details Name: [name] MRN: [id number] DOB: 1965/04/06 Today's Date: 05/02/2017   History of Present Illness  52 yo male admitted with acute respiratory failure, R pneumothorax. Hx of drug abuse, OSA, CHF, sarcoidosis, COPD-O2 dep 4L at baseline.     Clinical Impression  On eval, pt was Min guard assist for mobility. He was only able to tolerate walking ~50 feet. Pt has a chest tube. He was on 6L O2 at rest-sats 95%. It is difficult to get an accurate reading on pt. While walking, reading was 84% on 6L [place] O2, dyspnea 4/4. Will continue to follow and progress activity as tolerated. Discussed d/c plan-pt prefers to return home. He is requesting any and all home assistance that is available to him.     Follow Up Recommendations Home health PT;Supervision - Intermittent; Home health aide, if possible.     Equipment Recommendations  None recommended by PT    Recommendations for Other Services       Precautions / Restrictions Precautions Precautions: Fall Precaution Comments: chest tube; monitor O2 sats Restrictions Weight Bearing Restrictions: No      Mobility  Bed Mobility               General bed mobility comments: sitting EOB  Transfers Overall transfer level: Needs assistance   Transfers: Sit to/from Stand Sit to Stand: Min guard         General transfer comment: close guard for safety.   Ambulation/Gait Ambulation/Gait assistance: Min guard Ambulation Distance (Feet): 50 Feet   Gait Pattern/deviations: Step-through pattern     General Gait Details: 2 standing rest breaks. dyspnea 4/4. O2 sats down to 84% on 6L [place] O2 during ambulation.   Stairs            Wheelchair Mobility    Modified Rankin (Stroke Patients Only)       Balance                                             Pertinent Vitals/Pain Pain Assessment: Faces Faces Pain Scale: Hurts little more Pain Location: pain with  coughing Pain Descriptors / Indicators: Sharp;Discomfort Pain Intervention(s): Monitored during session    Home Living Family/patient expects to be discharged to:: Private residence Living Arrangements: Spouse/significant other Available Help at Discharge: Family;Available PRN/intermittently Type of Home: House Home Access: Stairs to enter Entrance Stairs-Rails: None Entrance Stairs-Number of Steps: 4 Home Layout: Two level;Bed/bath upstairs   Additional Comments: home oxygen, pulse oximeter    Prior Function Level of Independence: Independent               Hand Dominance        Extremity/Trunk Assessment   Upper Extremity Assessment Upper Extremity Assessment: Overall WFL for tasks assessed    Lower Extremity Assessment Lower Extremity Assessment: Generalized weakness    Cervical / Trunk Assessment Cervical / Trunk Assessment: Normal  Communication   Communication: No difficulties  Cognition Arousal/Alertness: Awake/alert Behavior During Therapy: WFL for tasks assessed/performed Overall Cognitive Status: Within Functional Limits for tasks assessed                                        General Comments      Exercises     Assessment/Plan  PT Assessment Patient needs continued PT services  PT Problem List Decreased mobility;Decreased activity tolerance       PT Treatment Interventions Gait training;Therapeutic activities;Therapeutic exercise;Balance training;Patient/family education;Functional mobility training    PT Goals (Current goals can be found in the Care Plan section)  Acute Rehab PT Goals Patient Stated Goal: home. get back to baseline PT Goal Formulation: With patient Time For Goal Achievement: 05/16/17 Potential to Achieve Goals: Fair    Frequency Min 3X/week   Barriers to discharge        Co-evaluation               AM-PAC PT "6 Clicks" Daily Activity  Outcome Measure Difficulty turning over in bed  (including adjusting bedclothes, sheets and blankets)?: A Little Difficulty moving from lying on back to sitting on the side of the bed? : A Little Difficulty sitting down on and standing up from a chair with arms (e.g., wheelchair, bedside commode, etc,.)?: A Little Help needed moving to and from a bed to chair (including a wheelchair)?: A Little Help needed walking in hospital room?: A Little Help needed climbing 3-5 steps with a railing? : A Lot 6 Click Score: 17    End of Session Equipment Utilized During Treatment: Oxygen Activity Tolerance: Patient limited by fatigue(limited by dyspnea) Patient left: in chair;with call bell/phone within reach(sitting on window seat)   PT Visit Diagnosis: Difficulty in walking, not elsewhere classified (R26.2);Other abnormalities of gait and mobility (R26.89)    Time: [id number] PT Time Calculation (min) (ACUTE ONLY): 50 min   Charges:   PT Evaluation $PT Eval Moderate Complexity: 1 Mod PT Treatments $Gait Training: 8-22 mins $Therapeutic Activity: 8-22 mins   PT G Codes:          Rebeca AlertJannie Jaquanda Wickersham, MPT Pager: [phone]

## 2017-05-02 NOTE — Progress Notes (Signed)
Entered room during bedside report found pt with labored breathing. dayshift nurse states that rapid response was called earlier in the shift due to patient's change in respiratory status. Rapid response came to assess pt, and at that time decided to give him a breathing treatment. Instructed to call if respiratory status has not improved or worsened. Pt states that he does not feel like respiratory status has improved, also complaining of cough and right-sided chest pain at the site of chest tube. Requested that rapid response be called once again. Medicated pt with PO dilaudid and robitussin. Breathing remains labored with a strong, productive cough producing thick, yellow sputum. Pt has wheezes in upper lung fields with crackles in lower lung fields. Breaths around 20/minute, subcutaneous air palpated. Oxygen saturation 99% on 5L. Rapid response called once again. Pt seems to relax speaking with them. RR recommends tessalon perles, but not to hook pt back up to suction unless he worsens. Will continue to monitor at this time.

## 2017-05-02 NOTE — Progress Notes (Signed)
Called to bedside by RN to assess patients chest tube and respiratory status. [facility] Gave PRN Albuterol with good improvement. STAT chest X-ray ordered and MD paged. Tube stable per Xray and MD stated to place patient back on suction if the interventions were unsuccessful.

## 2017-05-02 NOTE — Progress Notes (Signed)
Patient complained of shortness of breath. RR called to check chest tube. Dressing changed on the chest tube. No signs of any air leak. Breathing treatment given to patient. RR paged critical care MD. Received order for portable chest x-ray. RR RN to Chief Financial Officerupdate writer to update. Patient reports feeling a little better after breathing treatment. Rn will continue to monitor patient.

## 2017-05-03 ENCOUNTER — Inpatient Hospital Stay (HOSPITAL_COMMUNITY): Payer: 59

## 2017-05-03 DIAGNOSIS — J939 Pneumothorax, unspecified: Secondary | ICD-10-CM

## 2017-05-03 DIAGNOSIS — J441 Chronic obstructive pulmonary disease with (acute) exacerbation: Secondary | ICD-10-CM

## 2017-05-03 DIAGNOSIS — R0902 Hypoxemia: Secondary | ICD-10-CM

## 2017-05-03 LAB — BASIC METABOLIC PANEL
Anion gap: 10 (ref 5–15)
Anion gap: 17 — ABNORMAL HIGH (ref 5–15)
BUN: 19 mg/dL (ref 6–20)
BUN: 17 mg/dL (ref 6–20)
CALCIUM: 9.8 mg/dL (ref 8.9–10.3)
CHLORIDE: 91 mmol/L — AB (ref 101–111)
CO2: 36 mmol/L — AB (ref 22–32)
CO2: 34 mmol/L — ABNORMAL HIGH (ref 22–32)
CREATININE: 0.7 mg/dL (ref 0.61–1.24)
Calcium: 9.7 mg/dL (ref 8.9–10.3)
Chloride: 87 mmol/L — ABNORMAL LOW (ref 101–111)
Creatinine, Ser: 0.72 mg/dL (ref 0.61–1.24)
GFR calc Af Amer: >60 mL/min (ref >60)
GFR calc Af Amer: >60 mL/min (ref >60)
GFR calc non Af Amer: >60 mL/min (ref >60)
GLUCOSE: 114 mg/dL — AB (ref 65–99)
Glucose, Bld: 149 mg/dL — ABNORMAL HIGH (ref 65–99)
POTASSIUM: 5.6 mmol/L — AB (ref 3.5–5.1)
POTASSIUM: 4.1 mmol/L (ref 3.5–5.1)
SODIUM: 138 mmol/L (ref 135–145)
Sodium: 137 mmol/L (ref 135–145)

## 2017-05-03 LAB — GLUCOSE, CAPILLARY: Glucose-Capillary: 163 mg/dL — ABNORMAL HIGH (ref 65–99)

## 2017-05-03 MED ORDER — SODIUM POLYSTYRENE SULFONATE 15 GM/60ML PO SUSP
60.0000 g | Freq: Once | ORAL | Status: AC
  Administered 2017-05-03: 60 g via ORAL
  Filled 2017-05-03 (×2): qty 240

## 2017-05-03 MED ORDER — MENTHOL 3 MG MT LOZG
1.0000 | LOZENGE | OROMUCOSAL | Status: DC | PRN
  Administered 2017-05-04: 3 mg via ORAL
  Filled 2017-05-03: qty 9

## 2017-05-03 MED ORDER — INSULIN ASPART 100 UNIT/ML ~~LOC~~ SOLN: 0.0000 [IU] | Freq: Every day | SUBCUTANEOUS | Status: DC

## 2017-05-03 MED ORDER — PREDNISONE 20 MG PO TABS: 20.0000 mg | ORAL_TABLET | Freq: Every day | ORAL | Status: DC

## 2017-05-03 MED ORDER — METHYLPREDNISOLONE SODIUM SUCC 40 MG IJ SOLR
40.0000 mg | Freq: Two times a day (BID) | INTRAMUSCULAR | Status: AC
  Administered 2017-05-03 – 2017-05-04 (×4): 40 mg via INTRAVENOUS
  Filled 2017-05-03 (×4): qty 1

## 2017-05-03 MED ORDER — IPRATROPIUM-ALBUTEROL 0.5-2.5 (3) MG/3ML IN SOLN
3.0000 mL | Freq: Four times a day (QID) | RESPIRATORY_TRACT | Status: DC
  Administered 2017-05-03 – 2017-05-07 (×17): 3 mL via RESPIRATORY_TRACT
  Filled 2017-05-03 (×17): qty 3

## 2017-05-03 MED ORDER — ALBUTEROL SULFATE (2.5 MG/3ML) 0.083% IN NEBU
2.5000 mg | INHALATION_SOLUTION | RESPIRATORY_TRACT | Status: DC | PRN
  Administered 2017-05-03 – 2017-05-17 (×30): 2.5 mg via RESPIRATORY_TRACT
  Filled 2017-05-03 (×31): qty 3

## 2017-05-03 MED ORDER — INSULIN ASPART 100 UNIT/ML ~~LOC~~ SOLN
0.0000 [IU] | Freq: Three times a day (TID) | SUBCUTANEOUS | Status: DC
  Administered 2017-05-03: 3 [IU] via SUBCUTANEOUS

## 2017-05-03 MED ORDER — DOXYCYCLINE HYCLATE 100 MG PO TABS
100.0000 mg | ORAL_TABLET | Freq: Two times a day (BID) | ORAL | Status: AC
  Administered 2017-05-03 – 2017-05-09 (×11): 100 mg via ORAL
  Filled 2017-05-03 (×11): qty 1

## 2017-05-03 NOTE — Progress Notes (Addendum)
PULMONARY / CRITICAL CARE MEDICINE   Name: [name] MRN: [id number] DOB: 1965/09/01    ADMISSION DATE:  04/21/2017 CONSULTATION DATE: 05/10/2017  REFERRING MD:  Manus Gunning, MD [name]  CHIEF COMPLAINT: Increased dyspnea  BRIEF SUMMARY:   52 yr old male, smoker, chronic cocaine/opiate abuse, with PMH of severe obstructive lung disease, burnt out sarcoid with large bullae, OSA on CPAP, chronic diastolic CHF, recently discharged on 04/21/17 presented on 4/12 s/p cocaine use with dyspnea and chest pain,intubated on mechanical ventilation s/p R chest tube for pneumothorax.  Developed subcutaneous emphysema 4/13 when chest tube migrated out of thoracic cavity. Improved when new tube placed. CT to water seal 4/22, with increase in pneumothorax 4/23 imaging. Placed to 20 cm suction.    SUBJECTIVE:   Worsening pneumo on water seal per imaging 4/23. Resumed 20 cm suction COPD exacerbation   VITAL SIGNS: BP (!) 125/94 (BP Location: Left Arm)   Pulse (!) 102   Temp 98.1 F (36.7 C) (Oral)   Resp 15   Ht 5' 7.5" (1.715 m)   Wt 141 lb 15.6 oz (64.4 kg)   SpO2 100%   BMI 21.91 kg/m    INTAKE / OUTPUT: I/O last 3 completed shifts: In: 480 [P.O.:480] Out: 470 [Urine:400; Chest Tube:70]  PHYSICAL EXAMINATION: General: thin adult male wheezing and complaining of shortness of breath HEENT:NCAT, MM Pink and dry, No LAD Neuro: Awake and alert, but forgetful., MAE x 4, Alert to person and place  CV: S1, S2, RRR, No RMG PULM: even/labored labored on 4 L  [place], lungs bilaterally diminished , Inspiratory and expiratory wheezing throughout, , R CT with no obvious airleak, placed to 20 cm suction, fluctuant with resp, no leak, +  SQ crepitus HY:QMVH, non-tender, ND, bsx4 , active  Extremities: No obvious deformities, no edema Skin: Warm and dry, intact  LABS:  BMET Recent Labs  Lab 04/27/17 0304 04/29/17 0342 05/03/17 0638  NA 136 137 137  K 4.6 5.1 5.6*  CL 90* 93* 91*  CO2 32 32  36*  BUN 20 15 19   CREATININE 0.76 0.77 0.72  GLUCOSE 107* 89 114*    Electrolytes Recent Labs  Lab 04/27/17 0304 04/29/17 0342 05/03/17 0638  CALCIUM 9.7 9.9 9.8    CBC Recent Labs  Lab 04/27/17 0304 04/29/17 0342  WBC 7.6 5.6  HGB 13.0 11.9*  HCT 41.6 38.8*  PLT 333 292    Coag's No results for input(s): APTT, INR in the last 168 hours.  Sepsis Markers No results for input(s): LATICACIDVEN, PROCALCITON, O2SATVEN in the last 168 hours.  ABG No results for input(s): PHART, PCO2ART, PO2ART in the last 168 hours.  Liver Enzymes No results for input(s): AST, ALT, ALKPHOS, BILITOT, ALBUMIN in the last 168 hours.  Cardiac Enzymes No results for input(s): TROPONINI, PROBNP in the last 168 hours.  Glucose No results for input(s): GLUCAP in the last 168 hours.  Imaging Dg Chest Port 1 View  Result Date: 05/03/2017 CLINICAL DATA:  Continued shortness of breath, chest tube, sarcoidosis EXAM: PORTABLE CHEST 1 VIEW COMPARISON:  Portable exam 0905 hours compared to 05/03/2017 FINDINGS: RIGHT thoracostomy tube unchanged. Persistent RIGHT apex pneumothorax, medial extent better defined than on previous exam study. Stable heart size and mediastinal contours. Extensive LEFT basilar infiltrate. Atelectasis in RIGHT mid lung with additional infiltrate at RIGHT base. Underlying emphysematous changes with scarring and postsurgical changes at LEFT apex. No gross pleural effusion or pneumothorax. Chronic elevation of LEFT diaphragm. Bones demineralized.  IMPRESSION: Persistent RIGHT pneumothorax despite thoracostomy tube. COPD changes with bibasilar pulmonary infiltrates LEFT greater than RIGHT and RIGHT mid lung atelectasis. Electronically Signed   By: Ulyses SouthwardMark  Boles M.D.   On: 05/03/2017 09:41   Dg Chest Port 1 View  Result Date: 05/03/2017 CLINICAL DATA:  Pneumothorax EXAM: PORTABLE CHEST 1 VIEW COMPARISON:  05/02/2017 FINDINGS: Right chest tube remains in place with the side port in the  chest wall soft tissues. Right apical pneumothorax again noted, small, unchanged. Subcutaneous emphysema in the right chest wall. Patchy bilateral airspace disease with small effusions, unchanged. Heart is normal size. IMPRESSION: Stable position of the right chest tube with the side port in the chest wall soft tissues. Small right apical pneumothorax and subcutaneous emphysema are unchanged. Patchy bilateral opacities and small effusions, stable. Electronically Signed   By: Charlett NoseKevin  Dover M.D.   On: 05/03/2017 07:19   Dg Chest Port 1 View  Result Date: 05/02/2017 CLINICAL DATA:  RIGHT-sided chest tube insertion, coughing. EXAM: PORTABLE CHEST 1 VIEW COMPARISON:  Portable film earlier today. FINDINGS: The last side hole of the chest tube remains outside the pleural space. There is a moderate amount of RIGHT-sided subcutaneous emphysema, probably stable. Small RIGHT pneumothorax, also stable. No change BILATERAL pulmonary opacities or cardiomediastinal silhouette. IMPRESSION: RIGHT chest tube remains malpositioned. Small RIGHT apical pneumothorax and moderate subcutaneous emphysema remain unchanged. Electronically Signed   By: Elsie StainJohn T Curnes M.D.   On: 05/02/2017 18:23   Dg Chest Port 1 View  Result Date: 05/02/2017 CLINICAL DATA:  Pneumothorax EXAM: PORTABLE CHEST 1 VIEW COMPARISON:  Yesterday FINDINGS: Chest tube on the right with side port at the costal margin, also seen previously. Small right apical pneumothorax. There may be an interlobar gas component. Chest wall emphysema likely decreased at the supraclavicular fossae. History of sarcoidosis with advanced fibrosis. Stable mediastinal contours. IMPRESSION: 1. Small right-sided pneumothorax. Chest wall emphysema is mildly decreased. 2. Stable chest tube positioning with proximal side port at the bony chest wall. Electronically Signed   By: Marnee SpringJonathon  Watts M.D.   On: 05/02/2017 12:05  personally reviewed  STUDIES:  CT Chest 4/12 >> ETT 3 cm above  carina, small R pneumothorax, indwelling chest tube, stable findings of sarcoidosis with superimposed emphysema CT Head 4/12 >> negative  ANTIBIOTICS: 4/23>> Doxycycline  SIGNIFICANT EVENTS: 4/12  Admit with AMS, pneumothorax s/p chest tube on R 4/13  Pt developed SQ emphysema, chest tube replaced 4/14  Suction increased on CT due to SQ emphysema / leak  4/15  Suction decreased to 20 cm  4/16  Residual 10% ptx, remains on 20 cm suction 4/18  Respiratory variant on CT, minimal intermittent leak 4/23>. COPD exacerbation  LINES/TUBES: R Chest tube 4/12 >> 4/13, 4/13 >>    DISCUSSION: 52 year old man with significant bullous emphysema and pneumothorax likely secondary to with rupture of latter.  Pneumothorax  improving ,but still has small air leak  ASSESSMENT / PLAN:  PULMONARY A: Right Pneumothorax with SQ Emphysema >> increased after placed to water seal per CXR 4/23 Acute on Chronic Hypoxic Respiratory Failure - resolved, baseline 4L O2  Severe Bullous Emphysema  Sarcoidosis  OSA on CPAP - intermittently compliant  Secretions are discolored, were clear per patient on admission  COPD Flare 4/23 with wheezing and increased WOB P:   Chest tube to 20 cm suction CXR in 1 hour and in am 4/24 Chest tube site care daily & PRN  Follow daily CXR  Add scheduled  Duonebs  Q 6  Albuterol nebs to Q 2 prn Add IV steroids 40 mg BID Check sputum cx Doxycycline 100 mg BID x 7 days Will transfer to SDU for monitoring Saturation goal is 88-92% Monitor CO2 on BMET Aggressive pulmonary toilet   Type II Demand Related MI - EKG changes with modest troponin rise, no new wall motion on ECHO 04/18/2017>> EF 65-70%, Findings are consistent with severe chronic cor pulmonale,   possibly with acute decompensation. HFpEF  Sinus tach per tele P:  Resumed home cardizem, daily PO lasix  Tele monitoring Maintain MAP > 65  Renal Hyperkalemia Plan: Joyce Gross exelate now BMET at 1800 tonight Mag/ phos   in am BMET daily Trend urine output and creatinine Replete electrolytes prn  Infectious Afebrile CXR with Right and left basilar infiltrates Plan: Trend fever curve and WBC Start Doxycycline as above Sputum for culture Follow micro>> de-escalate ABX as able  Pain secondary to chest tube  P:   PRN PO dilaudid + IV for severe pain  toradol x 5 days  Minimize sedation as able  Bevelyn Ngo, AGACNP-BC 05/03/2017  10:28 AM Pager:  [phone]  Attending Note:  52 year old male with COPD, severe emphysema and tobacco/cocaine abuse who presents to the hospital with SOB and was noted to have a PTX.  The patient was admitted and CT was placed with adequate inflation and we were working on maintaining on water seal and possibly remove the CT today.  However, received a phone call this AM from bedside RN.  Patient was having severe respiratory distress and rapid response was called.  On exam, patient has severe wheezing.  I reviewed a STAT CXR myself, worsening PTX noted and patient is needing more O2 and WOB is a lot worse.  This is most likely a COPD exacerbation on top of the PTX.  Discussed with PCCM-NP.  Acute on chronic respiratory failure:             - Transfer to ICU/SDU             - Monitor closely for respiratory failure that would result in need for intubation  COPD exacerbation:             - Solumedrol IV             - Duonebs             - PRN albuterol             - Doxycycline for airway sterilization  Hypoxemia:             - Titrate O2 for sat of 88-92%             - Do not allow for over oxygenation  PTX:             - Place CT back to suction             - CXR later on today             - CXR in AM  Hyperkalemia unknown reason             - Kayexelate 60 gm PO x1             - BMET in AM             - BMET at 7 PM  The patient is critically ill with multiple organ systems failure and requires high complexity decision making for assessment and  support,  frequent evaluation and titration of therapies, application of advanced monitoring technologies and extensive interpretation of multiple databases.   Critical Care Time devoted to patient care services described in this note is  35  Minutes. This time reflects time of care of this signee Dr Koren Bound. This critical care time does not reflect procedure time, or teaching time or supervisory time of PA/NP/Med student/Med Resident etc but could involve care discussion time.  Alyson Reedy, M.D. [facility] Pulmonary/Critical Care Medicine. Pager: [phone]. After hours pager: [phone].

## 2017-05-03 NOTE — Progress Notes (Signed)
Nutrition Follow-up  DOCUMENTATION CODES:   Not applicable  INTERVENTION:   -ContinueEnsure Enlive BID, each supplement provides 350 kcal and 20 grams of protein. - Multivitamin with minerals.  - Continue to encourage PO intakes  NUTRITION DIAGNOSIS:   Inadequate oral intake related to acute illness, decreased appetite as evidenced by per patient/family report.  Progressing  GOAL:   Patient will meet greater than or equal to 90% of their needs  Meeting  MONITOR:   PO intake, Supplement acceptance, Weight trends, Labs  REASON FOR ASSESSMENT:   Ventilator    ASSESSMENT:   52 year-old male who was d/c'ed on 4/11 and presented to the ED on 4/12 with shortness of breath. History of COPD and lung cancer. Mental status declined in route to the hospital. EMS reported he was speaking to them initially. He was intubated emergently.   Pt reports appetite is great when he is able to breath. He was not able to eat breakfast this morning due to breathing difficulty which resulted in transfer to ICU stepdown. Upon RD visit pt was consuming a brownie with ice cream. Meal completions for the last 8 meals charted as 75-100%. Pt enjoys the Ensure would like to continue with these. Weight noted to increase by 8 lb since last RD visit on 4/16 (133 lb to 141 lb).   Medications reviewed and include: 40 mg lasix once daily, MVI with minerals Labs reviewed: K 5.6 (H)   Diet Order:  Diet Heart Room service appropriate? Yes; Fluid consistency: Thin  EDUCATION NEEDS:   No education needs have been identified at this time  Skin:  Skin Assessment: Reviewed RN Assessment  Last BM:  05/02/17  Height:   Ht Readings from Last 1 Encounters:  04/30/17 5' 7.5" (1.715 m)    Weight:   Wt Readings from Last 1 Encounters:  05/02/17 141 lb 15.6 oz (64.4 kg)    Ideal Body Weight:  67.27 kg  BMI:  Body mass index is 21.91 kg/m.  Estimated Nutritional Needs:   Kcal:  1950-2135 (32-35  kcal/kg)  Protein:  75-90 grams (1.2-1.5 grams/kg)  Fluid:  >/= 1.8 L/day    Vanessa Kickarly Fabiha Rougeau RD, LDN Clinical Nutrition Pager # - [phone]

## 2017-05-03 NOTE — Progress Notes (Signed)
Report called to Sarah, and patient transferred to stepdown.

## 2017-05-03 NOTE — Progress Notes (Signed)
Placed in room and set up for pt. use, pt. moved from floors this date, humidifier filled, pt. places on/offon own over n/c, RT to monitor.

## 2017-05-03 NOTE — Progress Notes (Deleted)
Attending Note:  52 year old male with COPD, severe emphysema and tobacco/cocaine abuse who presents to the hospital with SOB and was noted to have a PTX.  The patient was admitted and CT was placed with adequate inflation and we were working on maintaining on water seal and possibly remove the CT today.  However, received a phone call this AM from bedside RN.  Patient was having severe respiratory distress and rapid response was called.  On exam, patient has severe wheezing.  I reviewed a STAT CXR myself, worsening PTX noted and patient is needing more O2 and WOB is a lot worse.  This is most likely a COPD exacerbation on top of the PTX.  Discussed with PCCM-NP.  Acute on chronic respiratory failure:  - Transfer to ICU/SDU  - Monitor closely for respiratory failure that would result in need for intubation  COPD exacerbation:  - Solumedrol IV  - Duonebs  - PRN albuterol  - Doxycycline for airway sterilization  Hypoxemia:  - Titrate O2 for sat of 88-92%  - Do not allow for over oxygenation  PTX:  - Place CT back to suction  - CXR later on today  - CXR in AM  Hyperkalemia unknown reason  - Kayexelate 60 gm PO x1  - BMET in AM  - BMET at 7 PM  The patient is critically ill with multiple organ systems failure and requires high complexity decision making for assessment and support, frequent evaluation and titration of therapies, application of advanced monitoring technologies and extensive interpretation of multiple databases.   Critical Care Time devoted to patient care services described in this note is  35  Minutes. This time reflects time of care of this signee Dr Koren BoundWesam Abriella Filkins. This critical care time does not reflect procedure time, or teaching time or supervisory time of PA/NP/Med student/Med Resident etc but could involve care discussion time.  Alyson ReedyWesam G. Serjio Deupree, M.D. [facility] Pulmonary/Critical Care Medicine. Pager: [phone]. After hours pager: [phone].

## 2017-05-04 ENCOUNTER — Inpatient Hospital Stay (HOSPITAL_COMMUNITY): Payer: 59

## 2017-05-04 LAB — BASIC METABOLIC PANEL
ANION GAP: 15 (ref 5–15)
BUN: 19 mg/dL (ref 6–20)
CHLORIDE: 86 mmol/L — AB (ref 101–111)
CO2: 39 mmol/L — AB (ref 22–32)
CREATININE: 0.69 mg/dL (ref 0.61–1.24)
Calcium: 10.2 mg/dL (ref 8.9–10.3)
GFR calc non Af Amer: >60 mL/min (ref >60)
Glucose, Bld: 163 mg/dL — ABNORMAL HIGH (ref 65–99)
Potassium: 4.6 mmol/L (ref 3.5–5.1)
Sodium: 140 mmol/L (ref 135–145)

## 2017-05-04 LAB — CBC
HEMATOCRIT: 41.9 % (ref 39.0–52.0)
HEMOGLOBIN: 12.6 g/dL — AB (ref 13.0–17.0)
MCH: 25.2 pg — ABNORMAL LOW (ref 26.0–34.0)
MCHC: 30.1 g/dL (ref 30.0–36.0)
MCV: 83.8 fL (ref 78.0–100.0)
Platelets: 355 10*3/uL (ref 150–400)
RBC: 5 MIL/uL (ref 4.22–5.81)
RDW: 13.7 % (ref 11.5–15.5)
WBC: 8.6 10*3/uL (ref 4.0–10.5)

## 2017-05-04 LAB — PHOSPHORUS: PHOSPHORUS: 4.6 mg/dL (ref 2.5–4.6)

## 2017-05-04 LAB — MAGNESIUM: Magnesium: 2 mg/dL (ref 1.7–2.4)

## 2017-05-04 MED ORDER — BENZONATATE 100 MG PO CAPS
200.0000 mg | ORAL_CAPSULE | Freq: Three times a day (TID) | ORAL | Status: DC | PRN
  Administered 2017-05-04 – 2017-05-11 (×4): 200 mg via ORAL
  Filled 2017-05-04 (×5): qty 2

## 2017-05-04 NOTE — Progress Notes (Signed)
PULMONARY / CRITICAL CARE MEDICINE   Name: [name] MRN: [id number] DOB: [birth year]    ADMISSION DATE:  05-20-17 CONSULTATION DATE: 20-May-2017  REFERRING MD:  Manus Gunning, MD [name]  CHIEF COMPLAINT: Increased dyspnea  BRIEF SUMMARY:   52 yr old male, smoker, chronic cocaine/opiate abuse, with PMH of severe obstructive lung disease, burnt out sarcoid with large bullae, OSA on CPAP, chronic diastolic CHF, recently discharged on 04/21/17 presented on 4/12 s/p cocaine use with dyspnea and chest pain,intubated on mechanical ventilation s/p R chest tube for pneumothorax.  Developed subcutaneous emphysema 4/13 when chest tube migrated out of thoracic cavity. Improved when new tube placed. CT to water seal 4/22, with increase in pneumothorax 4/23 imaging. Placed to 20 cm suction.   SUBJECTIVE:   Pneumo unresolved, no events overnight, no new complaints  VITAL SIGNS: BP (!) 155/111   Pulse (!) 122   Temp 98.1 F (36.7 C) (Oral)   Resp 19   Ht 5' 7.5" (1.715 m)   Wt 141 lb 15.6 oz (64.4 kg)   SpO2 99%   BMI 21.91 kg/m     INTAKE / OUTPUT: I/O last 3 completed shifts: In: -  Out: 970 [Urine:900; Chest Tube:70]  PHYSICAL EXAMINATION: General: thin adult male in moderate respiratory distress HEENT: [place]/AT, PERRL, EOM-I and MMM Neuro: Awake and interactive, moving all ext to command CV: RRR, Nl S1/S2 and -M/R/G PULM: End exp wheezes noted with decreased BS diffusely GI: Soft, NT, ND and +BS Extremities: -edema and -tenderness Skin: Warm and dry, intact  LABS:  BMET Recent Labs  Lab 05/03/17 0638 05/03/17 1833 05/04/17 0322  NA 137 138 140  K 5.6* 4.1 4.6  CL 91* 87* 86*  CO2 36* 34* 39*  BUN 19 17 19   CREATININE 0.72 0.70 0.69  GLUCOSE 114* 149* 163*   Electrolytes Recent Labs  Lab 05/03/17 0638 05/03/17 1833 05/04/17 0322  CALCIUM 9.8 9.7 10.2  MG  --   --  2.0  PHOS  --   --  4.6   CBC Recent Labs  Lab 04/29/17 0342 05/04/17 0322  WBC 5.6 8.6   HGB 11.9* 12.6*  HCT 38.8* 41.9  PLT 292 355   Coag's No results for input(s): APTT, INR in the last 168 hours.  Sepsis Markers No results for input(s): LATICACIDVEN, PROCALCITON, O2SATVEN in the last 168 hours.  ABG No results for input(s): PHART, PCO2ART, PO2ART in the last 168 hours.  Liver Enzymes No results for input(s): AST, ALT, ALKPHOS, BILITOT, ALBUMIN in the last 168 hours.  Cardiac Enzymes No results for input(s): TROPONINI, PROBNP in the last 168 hours.  Glucose Recent Labs  Lab 05/03/17 1646  GLUCAP 163*   Imaging Dg Chest Port 1 View  Result Date: 05/03/2017 CLINICAL DATA:  Respiratory failure, shortness of breath. EXAM: PORTABLE CHEST 1 VIEW COMPARISON:  Radiographs of same day. FINDINGS: Stable cardiomediastinal silhouette. Right-sided chest tube has been directed more apically. Mild right apical pneumothorax is noted which is slightly enlarged compared to prior exam overlying subcutaneous emphysema is noted as the side hole of the chest tube is outside of thoracic space. Postsurgical changes are noted in left lung apex. Stable bilateral midlung opacities are noted concerning for scarring, atelectasis or possibly fibrosis. Scarring is noted in left lung base. Bony thorax is unremarkable. IMPRESSION: Right-sided chest tube has been repositioned with tip more apical in position. Mild right apical pneumothorax is noted which is enlarged compared to prior exam. Side hole of chest  tube is outside of the thoracic space. Mild subcutaneous emphysema is seen over right lateral chest wall. Electronically Signed   By: Lupita RaiderJames  Green Jr, M.D.   On: 05/03/2017 13:47   Dg Chest Port 1 View  Result Date: 05/03/2017 CLINICAL DATA:  Continued shortness of breath, chest tube, sarcoidosis EXAM: PORTABLE CHEST 1 VIEW COMPARISON:  Portable exam 0905 hours compared to 05/03/2017 FINDINGS: RIGHT thoracostomy tube unchanged. Persistent RIGHT apex pneumothorax, medial extent better defined  than on previous exam study. Stable heart size and mediastinal contours. Extensive LEFT basilar infiltrate. Atelectasis in RIGHT mid lung with additional infiltrate at RIGHT base. Underlying emphysematous changes with scarring and postsurgical changes at LEFT apex. No gross pleural effusion or pneumothorax. Chronic elevation of LEFT diaphragm. Bones demineralized. IMPRESSION: Persistent RIGHT pneumothorax despite thoracostomy tube. COPD changes with bibasilar pulmonary infiltrates LEFT greater than RIGHT and RIGHT mid lung atelectasis. Electronically Signed   By: Ulyses SouthwardMark  Boles M.D.   On: 05/03/2017 09:41  personally reviewed  STUDIES:  CT Chest 4/12 >> ETT 3 cm above carina, small R pneumothorax, indwelling chest tube, stable findings of sarcoidosis with superimposed emphysema CT Head 4/12 >> negative  ANTIBIOTICS: 4/23>> Doxycycline  SIGNIFICANT EVENTS: 4/12  Admit with AMS, pneumothorax s/p chest tube on R 4/13  Pt developed SQ emphysema, chest tube replaced 4/14  Suction increased on CT due to SQ emphysema / leak  4/15  Suction decreased to 20 cm  4/16  Residual 10% ptx, remains on 20 cm suction 4/18  Respiratory variant on CT, minimal intermittent leak 4/23>. COPD exacerbation  LINES/TUBES: R Chest tube 4/12 >> 4/13, 4/13 >>    I reviewed new CXR myself, PTX is improving and CT is in good position  DISCUSSION: 52 year old man with significant bullous emphysema and pneumothorax likely secondary to with rupture of latter.  Pneumothorax  improving ,but still has small air leak  ASSESSMENT / PLAN:  PULMONARY A: Right Pneumothorax with SQ Emphysema >> increased after placed to water seal per CXR 4/23 Acute on Chronic Hypoxic Respiratory Failure - resolved, baseline 4L O2  Severe Bullous Emphysema  Sarcoidosis  OSA on CPAP - intermittently compliant  Secretions are discolored, were clear per patient on admission  COPD Flare 4/23 with wheezing and increased WOB P:   Chest tube to 20  cm suction, will attempt to get to water seal now that patient is no longer wheezing. CXR at noon today CXR in AM Chest tube site care daily & PRN  Follow daily CXR  Continue scheduled  Duonebs  Q 6 Albuterol nebs to Q 2 prn IV steroids 40 mg BID Check sputum cx Doxycycline 100 mg BID x 7 days Hold in the SDU for now Saturation goal is 88-92% Monitor CO2 on BMET Aggressive pulmonary toilet   Type II Demand Related MI - EKG changes with modest troponin rise, no new wall motion on ECHO 04/18/2017>> EF 65-70%, Findings are consistent with severe chronic cor pulmonale,   possibly with acute decompensation. HFpEF  Sinus tach per tele P:  Resumed home cardizem, daily PO lasix  Tele monitoring Maintain MAP > 65  Renal Hyperkalemia Plan: Kayexalate given, K down to 4.6, recheck again in AM Mag/ phos  in am BMET daily Trend urine output and creatinine Replete electrolytes prn  Infectious Afebrile CXR with Right and left basilar infiltrates Plan: Trend fever curve and WBC Doxycycline as above Sputum for culture Follow micro>> de-escalate ABX as able  Pain secondary to chest tube  P:  PRN PO dilaudid + IV for severe pain  D/C toradol Minimize sedation as able  Discussed with Dr Alla German, if airleak persists then recommendations is talc pleurodesis, will hold of for now since there is no airleak now but will change chest tube to waterseal and check CXR at noon and in AM, if no PTX worsening will attempt to avoid pleurodesis.  Alyson Reedy, M.D. [facility] Pulmonary/Critical Care Medicine. Pager: [phone]. After hours pager: [phone]

## 2017-05-05 ENCOUNTER — Inpatient Hospital Stay (HOSPITAL_COMMUNITY): Payer: 59

## 2017-05-05 DIAGNOSIS — J939 Pneumothorax, unspecified: Secondary | ICD-10-CM

## 2017-05-05 LAB — CBC
HCT: 43.3 % (ref 39.0–52.0)
Hemoglobin: 12.9 g/dL — ABNORMAL LOW (ref 13.0–17.0)
MCH: 25.4 pg — ABNORMAL LOW (ref 26.0–34.0)
MCHC: 29.8 g/dL — AB (ref 30.0–36.0)
MCV: 85.2 fL (ref 78.0–100.0)
Platelets: 351 10*3/uL (ref 150–400)
RBC: 5.08 MIL/uL (ref 4.22–5.81)
RDW: 13.6 % (ref 11.5–15.5)
WBC: 13.1 10*3/uL — ABNORMAL HIGH (ref 4.0–10.5)

## 2017-05-05 LAB — PHOSPHORUS: PHOSPHORUS: 3.3 mg/dL (ref 2.5–4.6)

## 2017-05-05 LAB — BASIC METABOLIC PANEL
Anion gap: 14 (ref 5–15)
BUN: 21 mg/dL — AB (ref 6–20)
CO2: 36 mmol/L — ABNORMAL HIGH (ref 22–32)
CREATININE: 0.73 mg/dL (ref 0.61–1.24)
Calcium: 9.8 mg/dL (ref 8.9–10.3)
Chloride: 84 mmol/L — ABNORMAL LOW (ref 101–111)
GFR calc non Af Amer: >60 mL/min (ref >60)
Glucose, Bld: 127 mg/dL — ABNORMAL HIGH (ref 65–99)
Potassium: 5.1 mmol/L (ref 3.5–5.1)
SODIUM: 134 mmol/L — AB (ref 135–145)

## 2017-05-05 LAB — MAGNESIUM: MAGNESIUM: 1.9 mg/dL (ref 1.7–2.4)

## 2017-05-05 MED ORDER — TALC (STERITALC) POWDER FOR INTRAPLEURAL USE
4.0000 g | Freq: Once | INTRAVENOUS | Status: AC
  Administered 2017-05-05: 4 g via INTRAPLEURAL
  Filled 2017-05-05: qty 4

## 2017-05-05 MED ORDER — TALC (STERITALC) POWDER FOR INTRAPLEURAL USE
4.0000 g | Freq: Once | INTRAVENOUS | Status: DC
  Filled 2017-05-05: qty 4

## 2017-05-05 MED ORDER — BOOST / RESOURCE BREEZE PO LIQD CUSTOM
1.0000 | Freq: Two times a day (BID) | ORAL | Status: DC
  Administered 2017-05-05 (×2): 1 via ORAL

## 2017-05-05 NOTE — Progress Notes (Signed)
Pt. placed on CPAP after scheduled aerosol tx. given, RN made aware, humidifier refilled earlier this date with s/w.

## 2017-05-05 NOTE — Progress Notes (Addendum)
PT Cancellation Note  Patient Details Name: [name] MRN: [id number] DOB: 04/09/1965   Cancelled Treatment:    Reason Eval/Treat Not Completed: Medical issues which prohibited therapy. Pt had talc pleurodesis procedure on today. Will hold PT today and check back another day.    Rebeca AlertJannie Bohden Dung, MPT Pager: [phone]

## 2017-05-05 NOTE — Progress Notes (Signed)
PULMONARY / CRITICAL CARE MEDICINE   Name: [name] MRN: [id number] DOB: [birth year]    ADMISSION DATE:  05/07/2017 CONSULTATION DATE: 05/02/2017  REFERRING MD:  Manus Gunning, MD [name]  CHIEF COMPLAINT: Increased dyspnea  BRIEF SUMMARY:   52 yr old male, smoker, chronic cocaine/opiate abuse, with PMH of severe obstructive lung disease, burnt out sarcoid with large bullae, OSA on CPAP, chronic diastolic CHF, recently discharged on 04/21/17 presented on 4/12 s/p cocaine use with dyspnea and chest pain,intubated on mechanical ventilation s/p R chest tube for pneumothorax.  Developed subcutaneous emphysema 4/13 when chest tube migrated out of thoracic cavity. Improved when new tube placed. CT to water seal 4/22, with increase in pneumothorax 4/23 imaging. Placed to 20 cm suction.   SUBJECTIVE:   Pneumo resolved on CXR but remains with an air leak  VITAL SIGNS: BP (!) 125/92   Pulse (!) 104   Temp 98.3 F (36.8 C) (Axillary)   Resp 11   Ht 5' 7.5" (1.715 m)   Wt 141 lb 15.6 oz (64.4 kg)   SpO2 100%   BMI 21.91 kg/m    INTAKE / OUTPUT: I/O last 3 completed shifts: In: 240 [P.O.:240] Out: 115 [Chest Tube:115]  PHYSICAL EXAMINATION: General: Chronically ill appearing male, NAD HEENT: [place]/AT, PERRL, EOM-I and MMM Neuro: Awake and interactive, moving all ext to command CV: RRR, Nl S1/S2 and -M/R/G PULM: Mild end exp wheezes noted, persistent cough on exam, leak from CT GI: Soft, NT, ND and +BS Extremities: -edema and -tenderness Skin: Warm, intact  LABS:  BMET Recent Labs  Lab 05/03/17 1833 05/04/17 0322 05/05/17 0329  NA 138 140 134*  K 4.1 4.6 5.1  CL 87* 86* 84*  CO2 34* 39* 36*  BUN 17 19 21*  CREATININE 0.70 0.69 0.73  GLUCOSE 149* 163* 127*   Electrolytes Recent Labs  Lab 05/03/17 1833 05/04/17 0322 05/05/17 0329  CALCIUM 9.7 10.2 9.8  MG  --  2.0 1.9  PHOS  --  4.6 3.3   CBC Recent Labs  Lab 04/29/17 0342 05/04/17 0322 05/05/17 0329  WBC  5.6 8.6 13.1*  HGB 11.9* 12.6* 12.9*  HCT 38.8* 41.9 43.3  PLT 292 355 351   Coag's No results for input(s): APTT, INR in the last 168 hours.  Sepsis Markers No results for input(s): LATICACIDVEN, PROCALCITON, O2SATVEN in the last 168 hours.  ABG No results for input(s): PHART, PCO2ART, PO2ART in the last 168 hours.  Liver Enzymes No results for input(s): AST, ALT, ALKPHOS, BILITOT, ALBUMIN in the last 168 hours.  Cardiac Enzymes No results for input(s): TROPONINI, PROBNP in the last 168 hours.  Glucose Recent Labs  Lab 05/03/17 1646  GLUCAP 163*   Imaging Dg Chest Port 1 View  Result Date: 05/05/2017 CLINICAL DATA:  Pneumothorax EXAM: PORTABLE CHEST 1 VIEW COMPARISON:  05/04/2017 FINDINGS: Right chest tube remains in place. Right apical pneumothorax again noted, unchanged. The side port of the right chest tube remains in the chest wall soft tissues. Mild subcutaneous emphysema. Patchy bilateral airspace opacities again noted, stable. Underlying COPD. Heart is normal size. IMPRESSION: Stable right apical pneumothorax with right chest tube in stable position. Electronically Signed   By: Charlett Nose M.D.   On: 05/05/2017 09:43   Dg Chest Port 1 View  Result Date: 05/04/2017 CLINICAL DATA:  Pneumothorax. EXAM: PORTABLE CHEST 1 VIEW COMPARISON:  Radiograph of same day. FINDINGS: Stable cardiomediastinal silhouette. Right-sided chest tube is unchanged in position. Stable mild right apical  pneumothorax is noted. Stable bilateral lung opacities are noted. Bibasilar scarring or minimal pleural effusions is noted. IMPRESSION: Stable position of right-sided chest tube. Stable mild right apical pneumothorax is noted. Stable bilateral lung opacities. Electronically Signed   By: Lupita RaiderJames  Green Jr, M.D.   On: 05/04/2017 13:25  personally reviewed  STUDIES:  CT Chest 4/12 >> ETT 3 cm above carina, small R pneumothorax, indwelling chest tube, stable findings of sarcoidosis with superimposed  emphysema CT Head 4/12 >> negative  ANTIBIOTICS: 4/23>> Doxycycline  SIGNIFICANT EVENTS: 4/12  Admit with AMS, pneumothorax s/p chest tube on R 4/13  Pt developed SQ emphysema, chest tube replaced 4/14  Suction increased on CT due to SQ emphysema / leak  4/15  Suction decreased to 20 cm  4/16  Residual 10% ptx, remains on 20 cm suction 4/18  Respiratory variant on CT, minimal intermittent leak 4/23>. COPD exacerbation  LINES/TUBES: R Chest tube 4/12 >> 4/13, 4/13 >>    I reviewed new CXR myself, PTX persists but stable  DISCUSSION: 52 year old man with significant bullous emphysema and pneumothorax likely secondary to with rupture of latter.  Pneumothorax  improving ,but still has small air leak  ASSESSMENT / PLAN:  PULMONARY A: Right Pneumothorax with SQ Emphysema >> increased after placed to water seal per CXR 4/23 Acute on Chronic Hypoxic Respiratory Failure - resolved, baseline 4L O2  Severe Bullous Emphysema  Sarcoidosis  OSA on CPAP - intermittently compliant  Secretions are discolored, were clear per patient on admission  COPD Flare 4/23 with wheezing and increased WOB P:   Talc pleurodesis today CXR in AM Chest tube site care daily & PRN  Beware of tension, discussed with bedside RN Follow daily CXR  Continue scheduled  Duonebs  Q 6 Albuterol nebs to Q 2 prn IV steroids 40 mg BID Check sputum cx Doxycycline 100 mg BID x 7 days Hold in the SDU for now given pleurodesis today Saturation goal is 88-92% Monitor CO2 on BMET Aggressive pulmonary toilet   Type II Demand Related MI - EKG changes with modest troponin rise, no new wall motion on ECHO 04/18/2017>> EF 65-70%, Findings are consistent with severe chronic cor pulmonale,   possibly with acute decompensation. HFpEF  Sinus tach per tele P:  Resumed home cardizem, daily PO lasix  Tele monitoring Maintain MAP > 65  Renal Hyperkalemia unknown cause Plan: Minimize K intake, removed bananas and chips  from room Consulted with pharmacy and nutrition to minimize K intake Mag/ phos  in am BMET daily If K is above 6 again will call renal  Infectious Afebrile CXR with Right and left basilar infiltrates Plan: Trend fever curve and WBC Doxycycline as above, total of 5 days Sputum for culture Follow micro>> de-escalate ABX as able  Pain secondary to chest tube  P:   PRN PO dilaudid + IV for severe pain  D/C toradol Minimize sedation as able  Discussed with Dr Alla GermanVantright, persistent airleak, spoke with patient and wife, informed that surgery is danger for patient (CVTS recommended talc) and that talc pleurodesis carries the risk of mesothelioma in the future.  They elected talc pleurodesis.  Will perform today when talc is available from pharmacy.  Alyson ReedyWesam G. Yacoub, M.D. [facility] Pulmonary/Critical Care Medicine. Pager: [phone]. After hours pager: [phone]

## 2017-05-05 NOTE — Progress Notes (Signed)
NUTRITION NOTE  Pt seen for full assessment by RD on 4/23. Talked with Dr. Molli KnockYacoub who reports pt required 2 doses of Kayexalate and K now trending back up. Pt is ordered to receive Ensure Enlive BID. Each bottle contains 1072 mg K. Based on order, pt has been accepting nearly each bottle of Ensure since order placed on 4/16.  Will d/c Ensure Enlive and will order Boost Breeze BID, each supplement provides 250 kcal and 9 grams of protein and no K.    RD will continue to follow per protocol.    [name] GammonJessica Karilynn Carranza, MS, RD, LDN, [facility] Inpatient Clinical Dietitian Pager # [phone] After hours/weekend pager # [phone]

## 2017-05-06 ENCOUNTER — Inpatient Hospital Stay (HOSPITAL_COMMUNITY): Payer: 59

## 2017-05-06 LAB — PHOSPHORUS: Phosphorus: 3.1 mg/dL (ref 2.5–4.6)

## 2017-05-06 LAB — BLOOD GAS, ARTERIAL
Acid-Base Excess: 15.8 mmol/L — ABNORMAL HIGH (ref 0.0–2.0)
Acid-Base Excess: 14.2 mmol/L — ABNORMAL HIGH (ref 0.0–2.0)
BICARBONATE: 46.7 mmol/L — AB (ref 20.0–28.0)
Bicarbonate: 41.9 mmol/L — ABNORMAL HIGH (ref 20.0–28.0)
DRAWN BY: 422461
Delivery systems: POSITIVE
Drawn by: 422461
EXPIRATORY PAP: 5
FIO2: 45
Inspiratory PAP: 20
MODE: POSITIVE
O2 CONTENT: 6 L/min
O2 SAT: 98.1 %
O2 SAT: 98 %
PCO2 ART: 103 mmHg — AB (ref 32.0–48.0)
PH ART: 7.393 (ref 7.350–7.450)
PO2 ART: 93.7 mmHg (ref 83.0–108.0)
Patient temperature: 97.5
Patient temperature: 97.5
RATE: 14 resp/min
pCO2 arterial: 69.7 mmHg (ref 32.0–48.0)
pH, Arterial: 7.276 — ABNORMAL LOW (ref 7.350–7.450)
pO2, Arterial: 116 mmHg — ABNORMAL HIGH (ref 83.0–108.0)

## 2017-05-06 LAB — CBC
HCT: 39.9 % (ref 39.0–52.0)
HEMOGLOBIN: 11.7 g/dL — AB (ref 13.0–17.0)
MCH: 25.4 pg — ABNORMAL LOW (ref 26.0–34.0)
MCHC: 29.3 g/dL — AB (ref 30.0–36.0)
MCV: 86.6 fL (ref 78.0–100.0)
Platelets: 366 10*3/uL (ref 150–400)
RBC: 4.61 MIL/uL (ref 4.22–5.81)
RDW: 13.7 % (ref 11.5–15.5)
WBC: 23.6 10*3/uL — ABNORMAL HIGH (ref 4.0–10.5)

## 2017-05-06 LAB — BLOOD GAS, VENOUS
ACID-BASE EXCESS: 6.5 mmol/L — AB (ref 0.0–2.0)
Bicarbonate: 32.9 mmol/L — ABNORMAL HIGH (ref 20.0–28.0)
O2 SAT: 64.5 %
PATIENT TEMPERATURE: 98.6
PO2 VEN: 36.2 mmHg (ref 32.0–45.0)
pCO2, Ven: 58.5 mmHg (ref 44.0–60.0)
pH, Ven: 7.368 (ref 7.250–7.430)

## 2017-05-06 LAB — GLUCOSE, CAPILLARY
GLUCOSE-CAPILLARY: 157 mg/dL — AB (ref 65–99)
GLUCOSE-CAPILLARY: 144 mg/dL — AB (ref 65–99)
GLUCOSE-CAPILLARY: 161 mg/dL — AB (ref 65–99)

## 2017-05-06 LAB — BASIC METABOLIC PANEL
Anion gap: 14 (ref 5–15)
BUN: 29 mg/dL — AB (ref 6–20)
CHLORIDE: 86 mmol/L — AB (ref 101–111)
CO2: 38 mmol/L — AB (ref 22–32)
CREATININE: 0.87 mg/dL (ref 0.61–1.24)
Calcium: 9.7 mg/dL (ref 8.9–10.3)
GFR calc Af Amer: >60 mL/min (ref >60)
GFR calc non Af Amer: >60 mL/min (ref >60)
Glucose, Bld: 133 mg/dL — ABNORMAL HIGH (ref 65–99)
POTASSIUM: 4.6 mmol/L (ref 3.5–5.1)
SODIUM: 138 mmol/L (ref 135–145)

## 2017-05-06 LAB — MAGNESIUM: MAGNESIUM: 1.7 mg/dL (ref 1.7–2.4)

## 2017-05-06 MED ORDER — MIDAZOLAM HCL 2 MG/2ML IJ SOLN
2.0000 mg | Freq: Once | INTRAMUSCULAR | Status: AC
  Administered 2017-05-06: 2 mg via INTRAVENOUS

## 2017-05-06 MED ORDER — MIDAZOLAM HCL 2 MG/2ML IJ SOLN
INTRAMUSCULAR | Status: AC
  Filled 2017-05-06: qty 2

## 2017-05-06 MED ORDER — PHENYLEPHRINE HCL-NACL 10-0.9 MG/250ML-% IV SOLN
0.0000 ug/min | INTRAVENOUS | Status: DC
  Administered 2017-05-06: 20 ug/min via INTRAVENOUS
  Filled 2017-05-06: qty 250

## 2017-05-06 MED ORDER — SODIUM CHLORIDE 0.9 % IV BOLUS
500.0000 mL | Freq: Once | INTRAVENOUS | Status: AC
  Administered 2017-05-06: 500 mL via INTRAVENOUS

## 2017-05-06 MED ORDER — FENTANYL 2500MCG IN NS 250ML (10MCG/ML) PREMIX INFUSION
25.0000 ug/h | INTRAVENOUS | Status: DC
  Administered 2017-05-06 (×3): 50 ug/h via INTRAVENOUS
  Administered 2017-05-07: 175 ug/h via INTRAVENOUS
  Administered 2017-05-07: 50 ug/h via INTRAVENOUS
  Filled 2017-05-06 (×3): qty 250

## 2017-05-06 MED ORDER — MIDAZOLAM HCL 2 MG/2ML IJ SOLN
2.0000 mg | INTRAMUSCULAR | Status: DC | PRN
  Administered 2017-05-06: 2 mg via INTRAVENOUS
  Filled 2017-05-06: qty 2

## 2017-05-06 MED ORDER — FENTANYL CITRATE (PF) 100 MCG/2ML IJ SOLN
50.0000 ug | Freq: Once | INTRAMUSCULAR | Status: AC
  Administered 2017-05-06: 50 ug via INTRAVENOUS
  Filled 2017-05-06: qty 2

## 2017-05-06 MED ORDER — ETOMIDATE 2 MG/ML IV SOLN
20.0000 mg | Freq: Once | INTRAVENOUS | Status: AC
  Administered 2017-05-06: 20 mg via INTRAVENOUS

## 2017-05-06 MED ORDER — FENTANYL CITRATE (PF) 100 MCG/2ML IJ SOLN
INTRAMUSCULAR | Status: AC
  Filled 2017-05-06: qty 2

## 2017-05-06 MED ORDER — MIDAZOLAM HCL 2 MG/2ML IJ SOLN
2.0000 mg | INTRAMUSCULAR | Status: DC | PRN
  Administered 2017-05-06 – 2017-05-08 (×3): 2 mg via INTRAVENOUS
  Filled 2017-05-06 (×3): qty 2

## 2017-05-06 MED ORDER — HYDROMORPHONE HCL 1 MG/ML IJ SOLN
0.5000 mg | INTRAMUSCULAR | Status: DC | PRN
  Administered 2017-05-06 – 2017-05-07 (×2): 1 mg via INTRAVENOUS
  Filled 2017-05-06 (×2): qty 1

## 2017-05-06 MED ORDER — BUDESONIDE 0.25 MG/2ML IN SUSP
0.2500 mg | Freq: Two times a day (BID) | RESPIRATORY_TRACT | Status: DC
  Administered 2017-05-06 – 2017-05-10 (×9): 0.25 mg via RESPIRATORY_TRACT
  Filled 2017-05-06 (×9): qty 2

## 2017-05-06 MED ORDER — FENTANYL CITRATE (PF) 100 MCG/2ML IJ SOLN
100.0000 ug | Freq: Once | INTRAMUSCULAR | Status: AC
  Administered 2017-05-06: 100 ug via INTRAVENOUS

## 2017-05-06 MED ORDER — METHYLPREDNISOLONE SODIUM SUCC 125 MG IJ SOLR
125.0000 mg | Freq: Once | INTRAMUSCULAR | Status: AC
  Administered 2017-05-06: 125 mg via INTRAVENOUS
  Filled 2017-05-06: qty 2

## 2017-05-06 MED ORDER — METHYLPREDNISOLONE SODIUM SUCC 125 MG IJ SOLR
60.0000 mg | Freq: Four times a day (QID) | INTRAMUSCULAR | Status: DC
  Administered 2017-05-06 – 2017-05-08 (×9): 60 mg via INTRAVENOUS
  Filled 2017-05-06 (×9): qty 2

## 2017-05-06 MED ORDER — SODIUM CHLORIDE 0.9 % IV SOLN
0.0000 ug/min | INTRAVENOUS | Status: DC
  Filled 2017-05-06: qty 1

## 2017-05-06 MED ORDER — FENTANYL BOLUS VIA INFUSION
50.0000 ug | INTRAVENOUS | Status: DC | PRN
  Administered 2017-05-06 – 2017-05-07 (×3): 50 ug via INTRAVENOUS
  Filled 2017-05-06: qty 50

## 2017-05-06 MED ORDER — MAGNESIUM SULFATE 2 GM/50ML IV SOLN
2.0000 g | Freq: Once | INTRAVENOUS | Status: AC
  Administered 2017-05-06: 2 g via INTRAVENOUS
  Filled 2017-05-06: qty 50

## 2017-05-06 MED ORDER — ROCURONIUM BROMIDE 50 MG/5ML IV SOLN
50.0000 mg | Freq: Once | INTRAVENOUS | Status: AC
  Administered 2017-05-06: 50 mg via INTRAVENOUS

## 2017-05-06 NOTE — Progress Notes (Signed)
PULMONARY / CRITICAL CARE MEDICINE   Name: [name] MRN: [id number] DOB: 08/30/1965    ADMISSION DATE:  09/28/17 CONSULTATION DATE: 09/24/17  REFERRING MD:  Manus Gunningancour, MD [facility]  CHIEF COMPLAINT: Increased dyspnea  BRIEF SUMMARY:   52 yr old male, smoker, chronic cocaine/opiate abuse, with PMH of severe obstructive lung disease, burnt out sarcoid with large bullae, OSA on CPAP, chronic diastolic CHF, recently discharged on 04/21/17 presented on 4/12 s/p cocaine use with dyspnea and chest pain,intubated on mechanical ventilation s/p R chest tube for pneumothorax.  Developed subcutaneous emphysema 4/13 when chest tube migrated out of thoracic cavity. Improved when new tube placed. CT to water seal 4/22, with increase in pneumothorax 4/23 imaging. Placed to 20 cm suction.   SUBJECTIVE:   Decompensated overnight requiring BiPAP, remains on BiPAP  VITAL SIGNS: BP (!) 132/92   Pulse (!) 120   Temp 98.2 F (36.8 C) (Axillary)   Resp 16   Ht 5' 7.5" (1.715 m)   Wt 141 lb 15.6 oz (64.4 kg)   SpO2 100%   BMI 21.91 kg/m   INTAKE / OUTPUT: I/O last 3 completed shifts: In: 480 [P.O.:480] Out: 440 [Urine:400; Chest Tube:40]  PHYSICAL EXAMINATION: General: Acute on chronically appearing emaciated male, moderate respiratory distress HEENT: [place]/AT, PERRL, EOM-I and MMM Neuro: Awake and interactive, on BiPAP, moving all ext to command CV: RRR, Nl S1/S2 and -M/R/G. PULM: Prolonged expiratory phase with expiratory wheezes diffusely GI: Soft, NT, ND and +BS Extremities: -edema and -tenderness Skin: Warm, intact  LABS:  BMET Recent Labs  Lab 05/04/17 0322 05/05/17 0329 05/06/17 0332  NA 140 134* 138  K 4.6 5.1 4.6  CL 86* 84* 86*  CO2 39* 36* 38*  BUN 19 21* 29*  CREATININE 0.69 0.73 0.87  GLUCOSE 163* 127* 133*   Electrolytes Recent Labs  Lab 05/04/17 0322 05/05/17 0329 05/06/17 0332  CALCIUM 10.2 9.8 9.7  MG 2.0 1.9 1.7  PHOS 4.6 3.3 3.1   CBC Recent Labs   Lab 05/04/17 0322 05/05/17 0329 05/06/17 0332  WBC 8.6 13.1* 23.6*  HGB 12.6* 12.9* 11.7*  HCT 41.9 43.3 39.9  PLT 355 351 366   Coag's No results for input(s): APTT, INR in the last 168 hours.  Sepsis Markers No results for input(s): LATICACIDVEN, PROCALCITON, O2SATVEN in the last 168 hours.  ABG Recent Labs  Lab 05/06/17 0203 05/06/17 0314  PHART 7.276* 7.393  PCO2ART 103* 69.7*  PO2ART 116* 93.7   Liver Enzymes No results for input(s): AST, ALT, ALKPHOS, BILITOT, ALBUMIN in the last 168 hours.  Cardiac Enzymes No results for input(s): TROPONINI, PROBNP in the last 168 hours.  Glucose Recent Labs  Lab 05/03/17 1646  GLUCAP 163*   Imaging Dg Chest Port 1 View  Result Date: 05/06/2017 CLINICAL DATA:  Increasing shortness of breath.  Pneumothorax. EXAM: PORTABLE CHEST 1 VIEW COMPARISON:  05/05/2017 FINDINGS: Small residual right apical pneumothorax. Right chest tube is present. The proximal side hole of the chest tube appears to be outside of the thoracic cage. Subcutaneous emphysema in the right chest wall. Shallow inspiration with elevation of the left hemidiaphragm. Perihilar infiltration or scarring bilaterally. Small pleural effusions. Heart size and pulmonary vascularity are normal. Previous resection or resorption of the distal right clavicle. IMPRESSION: Small residual right pneumothorax without change since prior study. The right chest tube is present but the proximal side hole appears to be outside of the thoracic cage. Subcutaneous emphysema. Bilateral perihilar infiltration or scarring. Small  bilateral pleural effusions. These results were called by telephone at the time of interpretation on 05/06/2017 at 1:54 am to [facility], who verbally acknowledged these results. Electronically Signed   By: Burman Nieves M.D.   On: 05/06/2017 01:58  personally reviewed  STUDIES:  CT Chest 4/12 >> ETT 3 cm above carina, small R pneumothorax, indwelling chest tube, stable  findings of sarcoidosis with superimposed emphysema CT Head 4/12 >> negative  ANTIBIOTICS: 4/23>> Doxycycline  SIGNIFICANT EVENTS: 4/12  Admit with AMS, pneumothorax s/p chest tube on R 4/13  Pt developed SQ emphysema, chest tube replaced 4/14  Suction increased on CT due to SQ emphysema / leak  4/15  Suction decreased to 20 cm  4/16  Residual 10% ptx, remains on 20 cm suction 4/18  Respiratory variant on CT, minimal intermittent leak 4/23>. COPD exacerbation  LINES/TUBES: R Chest tube 4/12 >> 4/13, 4/13 >>    I reviewed new CXR myself, PTX persists but stable  DISCUSSION: 52 year old man with significant bullous emphysema and pneumothorax likely secondary to with rupture of latter.  Pneumothorax  improving ,but still has small air leak  ASSESSMENT / PLAN:  PULMONARY A: Right Pneumothorax with SQ Emphysema >> increased after placed to water seal per CXR 4/23 Acute on Chronic Hypoxic Respiratory Failure - resolved, baseline 4L O2  Severe Bullous Emphysema  Sarcoidosis  OSA on CPAP - intermittently compliant  Secretions are discolored, were clear per patient on admission  COPD Flare 4/23 with wheezing and increased WOB P:   CT to suction BiPAP for comfort Solumedrol 60 mg IV q6 hours CXR in AM Continue scheduled  Duonebs  Q 6 Albuterol nebs to Q 2 prn Doxycycline 100 mg BID x 7 days Keep in ICU given respiratory status Saturation goal is 88-92% Pulmonary toilet   Type II Demand Related MI - EKG changes with modest troponin rise, no new wall motion on ECHO 04/18/2017>> EF 65-70%, Findings are consistent with severe chronic cor pulmonale,   possibly with acute decompensation. HFpEF  Sinus tach per tele P:  Resumed home cardizem, daily PO lasix  Tele monitoring Maintain MAP > 65  Renal Hyperkalemia unknown cause Plan: Consulted with pharmacy and nutrition to minimize K intake Replace electrolytes as indicated BMET daily If K is above 6 again will call  renal  Infectious Afebrile CXR with Right and left basilar infiltrates Plan: Trend fever curve and WBC Doxycycline as above, total of 5 days Sputum for culture Follow micro>> de-escalate ABX as able  Pain secondary to chest tube  P:   PRN IV for severe pain  D/C toradol Minimize sedation as able  The patient is critically ill with multiple organ systems failure and requires high complexity decision making for assessment and support, frequent evaluation and titration of therapies, application of advanced monitoring technologies and extensive interpretation of multiple databases.   Critical Care Time devoted to patient care services described in this note is  38  Minutes. This time reflects time of care of this signee Dr Koren Bound. This critical care time does not reflect procedure time, or teaching time or supervisory time of PA/NP/Med student/Med Resident etc but could involve care discussion time.  Alyson Reedy, M.D. [facility] Pulmonary/Critical Care Medicine. Pager: [phone]. After hours pager: [phone].

## 2017-05-06 NOTE — Progress Notes (Signed)
PT Cancellation Note  Patient Details Name: [name] MRN: [id number] DOB: 09/11/1965   Cancelled Treatment:     PT deferred this date at request of RN - pt with increased resp distress and back on bipap.  Will follow.   Laiken Nohr 05/06/2017, 10:37 AM

## 2017-05-06 NOTE — Progress Notes (Signed)
Called patient's wife, Annice PihJackie to let her know the change of condition of patient. She stated that she was on her way to the hospital.

## 2017-05-06 NOTE — Progress Notes (Addendum)
OVERNIGHT COVERAGE PROGRESS NOTE  Called to see patient regarding altered mental status.  Talc pleurodesis procedure earlier in the day noted.  In the interim, ABG was obtained: pH 7.2, PCO2 103, PO2 116.  Patient was placed on BiPAP support (20/6) and is obtunded.  The patient did recently receive a dose of Dilaudid.  Nurse reports that the patient received albuterol approximately 30 minutes ago but the previous dose was approximately 7 hours earlier.   Lateral wheezing bilaterally.  No continuous air leak is auscultated.  Chest tube (still on wall suction) was examined.  Dressing removed. No air leak was appreciated despite persistent small right apical pneumothorax on chest x-ray.  Improved air leak was appreciated with barrel rolling to the left.    Chest x-ray suggests that the side-port is extra-thoracically located at this time.  Chest tube was cleaned and new occlusive dressing was applied. BiPAP settings changed to 20/4.   Orders for albuterol every 2 hours as needed for wheezing noted.  Discussed with nurse and respiratory staff that the patient will need more frequent dose administration.  Add budesonide. The patient should be positioned left side down preferentially.  Significant improvement in mentation was noted before the end of this clinical encounter.  Patient was awake, alert and following all commands. RR 16. Repeat ABG: 7.39/70/94 on BiPAP 20/4, FiO2 40%.  Wife at bedside. Updated.   Marcelle SmilingSeong-Joo Marna Weniger, MD Board Certified by the ABIM, Pulmonary Diseases & Critical Care Medicine   Critical care time: 60 minutes. The treatment and management of the patient's condition was required based on the threat of imminent deterioration. This time reflects time spent by the physician evaluating, providing care and managing the critically ill patient's care. The time was spent at the immediate bedside (or on the same floor/unit and dedicated to this patient's care). Time involved in  separately billable procedures is NOT included int he critical care time indicated above. Family meeting and update time may be included above if and only if the patient is unable/incompetent to participate in clinical interview and/or decision making, and the discussion was necessary to determining treatment decisions.  Marcelle SmilingSeong-Joo Deshawn Witty, MD

## 2017-05-06 NOTE — Procedures (Signed)
Intubation Procedure Note [name] [id number] [birth year]  Procedure: Intubation Indications: Respiratory insufficiency  Procedure Details Consent: Risks of procedure as well as the alternatives and risks of each were explained to the (patient/caregiver).  Consent for procedure obtained. Time Out: Verified patient identification, verified procedure, site/side was marked, verified correct patient position, special equipment/implants available, medications/allergies/relevent history reviewed, required imaging and test results available.  Performed  Maximum sterile technique was used including gloves, hand hygiene and mask.  MAC    Evaluation Hemodynamic Status: BP stable throughout; O2 sats: stable throughout Patient's Current Condition: stable Complications: No apparent complications Patient did tolerate procedure well. Chest X-ray ordered to verify placement.  CXR: pending.   [name] 05/06/2017

## 2017-05-06 NOTE — Progress Notes (Signed)
Patient experiencing increased work of breathing and feeling of impending doom. Coarse breath sounds bilaterally. Called Dr. Detterding. Morning CXR ordered STAT, breathing tx and IV diluadid. Patient now hard to arouse and HR in 130s. Called Dr. Detterding. Stat ABG ordered and on call doctor coming to see patient. Continue to monitor.

## 2017-05-07 ENCOUNTER — Inpatient Hospital Stay (HOSPITAL_COMMUNITY): Payer: 59

## 2017-05-07 DIAGNOSIS — J9602 Acute respiratory failure with hypercapnia: Secondary | ICD-10-CM

## 2017-05-07 LAB — BLOOD GAS, ARTERIAL
Acid-Base Excess: 14.8 mmol/L — ABNORMAL HIGH (ref 0.0–2.0)
Acid-Base Excess: 13.3 mmol/L — ABNORMAL HIGH (ref 0.0–2.0)
BICARBONATE: 41.4 mmol/L — AB (ref 20.0–28.0)
Bicarbonate: 41.2 mmol/L — ABNORMAL HIGH (ref 20.0–28.0)
DELIVERY SYSTEMS: POSITIVE
DRAWN BY: 308601
DRAWN BY: 308601
Expiratory PAP: 5
FIO2: 40
FIO2: 50
Inspiratory PAP: 20
MECHVT: 550 mL
Mode: POSITIVE
O2 Saturation: 93.9 %
O2 Saturation: 94.1 %
PEEP: 5 cmH2O
PO2 ART: 70.7 mmHg — AB (ref 83.0–108.0)
PO2 ART: 77.3 mmHg — AB (ref 83.0–108.0)
Patient temperature: 98.6
Patient temperature: 37
RATE: 18 resp/min
pCO2 arterial: 62.4 mmHg — ABNORMAL HIGH (ref 32.0–48.0)
pCO2 arterial: 75 mmHg (ref 32.0–48.0)
pH, Arterial: 7.436 (ref 7.350–7.450)
pH, Arterial: 7.361 (ref 7.350–7.450)

## 2017-05-07 LAB — GLUCOSE, CAPILLARY
GLUCOSE-CAPILLARY: 101 mg/dL — AB (ref 65–99)
Glucose-Capillary: 129 mg/dL — ABNORMAL HIGH (ref 65–99)
Glucose-Capillary: 144 mg/dL — ABNORMAL HIGH (ref 65–99)
Glucose-Capillary: 137 mg/dL — ABNORMAL HIGH (ref 65–99)
Glucose-Capillary: 111 mg/dL — ABNORMAL HIGH (ref 65–99)

## 2017-05-07 LAB — BASIC METABOLIC PANEL
Anion gap: 14 (ref 5–15)
BUN: 28 mg/dL — AB (ref 6–20)
CALCIUM: 9 mg/dL (ref 8.9–10.3)
CO2: 35 mmol/L — ABNORMAL HIGH (ref 22–32)
CREATININE: 0.83 mg/dL (ref 0.61–1.24)
Chloride: 88 mmol/L — ABNORMAL LOW (ref 101–111)
Glucose, Bld: 132 mg/dL — ABNORMAL HIGH (ref 65–99)
Potassium: 4.2 mmol/L (ref 3.5–5.1)
SODIUM: 137 mmol/L (ref 135–145)

## 2017-05-07 LAB — CBC
HCT: 36.2 % — ABNORMAL LOW (ref 39.0–52.0)
HEMOGLOBIN: 11 g/dL — AB (ref 13.0–17.0)
MCH: 25.1 pg — ABNORMAL LOW (ref 26.0–34.0)
MCHC: 30.4 g/dL (ref 30.0–36.0)
MCV: 82.5 fL (ref 78.0–100.0)
PLATELETS: 282 10*3/uL (ref 150–400)
RBC: 4.39 MIL/uL (ref 4.22–5.81)
RDW: 13.8 % (ref 11.5–15.5)
WBC: 13.6 10*3/uL — ABNORMAL HIGH (ref 4.0–10.5)

## 2017-05-07 LAB — PHOSPHORUS: PHOSPHORUS: 2.9 mg/dL (ref 2.5–4.6)

## 2017-05-07 LAB — MAGNESIUM: MAGNESIUM: 2 mg/dL (ref 1.7–2.4)

## 2017-05-07 MED ORDER — IPRATROPIUM-ALBUTEROL 0.5-2.5 (3) MG/3ML IN SOLN
3.0000 mL | RESPIRATORY_TRACT | Status: DC
  Administered 2017-05-07 – 2017-05-17 (×59): 3 mL via RESPIRATORY_TRACT
  Filled 2017-05-07 (×59): qty 3

## 2017-05-07 MED ORDER — HYDROMORPHONE HCL 1 MG/ML IJ SOLN
0.5000 mg | Freq: Four times a day (QID) | INTRAMUSCULAR | Status: DC | PRN
  Administered 2017-05-08 – 2017-05-09 (×3): 1 mg via INTRAVENOUS
  Filled 2017-05-07 (×3): qty 1

## 2017-05-07 MED ORDER — LIP MEDEX EX OINT
TOPICAL_OINTMENT | CUTANEOUS | Status: AC
  Administered 2017-05-07: 12:00:00
  Filled 2017-05-07: qty 7

## 2017-05-07 MED ORDER — DEXMEDETOMIDINE HCL IN NACL 200 MCG/50ML IV SOLN
0.4000 ug/kg/h | INTRAVENOUS | Status: DC
  Administered 2017-05-07: 1 ug/kg/h via INTRAVENOUS
  Administered 2017-05-07: 0.4 ug/kg/h via INTRAVENOUS
  Administered 2017-05-08 – 2017-05-09 (×8): 1.2 ug/kg/h via INTRAVENOUS
  Filled 2017-05-07 (×14): qty 50

## 2017-05-07 MED ORDER — DILTIAZEM LOAD VIA INFUSION
15.0000 mg | Freq: Once | INTRAVENOUS | Status: AC
  Administered 2017-05-07: 15 mg via INTRAVENOUS
  Filled 2017-05-07: qty 15

## 2017-05-07 MED ORDER — DILTIAZEM HCL-DEXTROSE 100-5 MG/100ML-% IV SOLN (PREMIX)
5.0000 mg/h | INTRAVENOUS | Status: AC
  Administered 2017-05-07 – 2017-05-08 (×2): 5 mg/h via INTRAVENOUS
  Filled 2017-05-07 (×3): qty 100

## 2017-05-07 NOTE — Progress Notes (Signed)
Wasted 200 ml of Fentanyl with Genell RN

## 2017-05-07 NOTE — Progress Notes (Signed)
Pt extubated per MD order. Pt remains on fentanyl gtt per MD.  HR remains ST 130s, BP remains elevated. Pt reports feeling a lot of anxiety. Will continue to monitor.

## 2017-05-07 NOTE — Procedures (Signed)
Extubation Procedure Note  Patient Details:   Name: [name] DOB: 1965/05/23 MRN: [id number]   Airway Documentation:  Airway 8 mm (Active)  Secured at (cm) 25 cm 05/07/2017  7:50 AM  Measured From Lips 05/07/2017  7:50 AM  Secured Location Center 05/07/2017  7:50 AM  Secured By [employer] 05/07/2017  7:50 AM  Tube Holder Repositioned Yes 05/07/2017  7:50 AM  Cuff Pressure (cm H2O) 22 cm H2O 05/06/2017  7:45 PM  Site Condition Dry 05/07/2017  7:50 AM   Vent end date: 05/07/17 Vent end time: 1043   Evaluation  O2 sats: stable throughout Complications: No apparent complications Patient did tolerate procedure well. Bilateral Breath Sounds: Inspiratory wheezes, Coarse crackles   Yes   Patient extubated to BiPAP per MD verbal order. RT will continue to monitor patient.   Dannielle Karvonen 05/07/2017, 10:47 AM

## 2017-05-07 NOTE — Progress Notes (Signed)
PULMONARY / CRITICAL CARE MEDICINE   Name: [name] MRN: [id number] DOB: 05/21/1965    ADMISSION DATE:  12-May-2017 CONSULTATION DATE:  May 12, 2017  REFERRING MD:  Rancour  CHIEF COMPLAINT:  Dyspnea  HISTORY OF PRESENT ILLNESS:   This is a 52 yr old male, smoker, chronic cocaine/opiate abuse, with PMH of severe obstructive lung disease, burnt out sarcoid with large bullae, OSA on CPAP, chronic diastolic CHF,recently discharged on 04/21/17 presented on 4/12 s/p cocaine use with dyspnea and chest pain,intubated on mechanical ventilation s/p R chest tube for pneumothorax. Developed subcutaneous emphysema 4/13 when chest tube migrated out of thoracic cavity. Improved when new tube placed. CT to water seal 4/22, with increase in pneumothorax 4/23 imaging. PCXR today shows the Ptx has resolved with the repositioned CT. He had been on BiPAP but decompensated on the night of 4/25 and intubated yesterday.  PAST MEDICAL HISTORY :  He  has a past medical history of Bronchitis, CHF (congestive heart failure) (HCC), Chronic respiratory failure (HCC), COPD (chronic obstructive pulmonary disease) (HCC), GERD (gastroesophageal reflux disease), Hypertension, OSA on CPAP, Pneumonia, and Sarcoidosis.  PAST SURGICAL HISTORY: He  has a past surgical history that includes Rotator cuff repair; arm surgery ; and Video assisted thoracoscopy (vats)/thorocotomy (Left, 07/17/2012).  Allergies  Allergen Reactions  . Clindamycin/Lincomycin Anaphylaxis and Swelling    Made face and tongue swell    No current facility-administered medications on file prior to encounter.    Current Outpatient Medications on File Prior to Encounter  Medication Sig  . albuterol (PROVENTIL HFA;VENTOLIN HFA) 108 (90 Base) MCG/ACT inhaler Inhale 2 puffs into the lungs every 6 (six) hours as needed for wheezing or shortness of breath.  Marland Kitchen albuterol (PROVENTIL) (2.5 MG/3ML) 0.083% nebulizer solution USE 1 VIAL IN NEBULIZER EVERY 6 HOURS AS  NEEDED FOR WHEEZING  . calcium-vitamin D (OSCAL WITH D) 500-200 MG-UNIT tablet Take 1 tablet by mouth daily.  . cyclobenzaprine (FLEXERIL) 10 MG tablet Take 10 mg by mouth 3 (three) times daily as needed for muscle spasms.   . diphenhydrAMINE (BENADRYL) 25 MG tablet Take 25 mg by mouth every 6 (six) hours as needed for itching or allergies.  . Ergocalciferol (VITAMIN D2) 400 units TABS Take 400 Units by mouth daily.  . ferrous sulfate 325 (65 FE) MG tablet Take 325 mg by mouth 2 (two) times daily with a meal.  . ipratropium-albuterol (DUONEB) 0.5-2.5 (3) MG/3ML SOLN Take 3 mLs by nebulization every 4 (four) hours as needed. (Patient taking differently: Take 3 mLs by nebulization every 4 (four) hours as needed (SOB, wheezing). )  . Multiple Vitamins-Minerals (MULTIVITAMIN WITH MINERALS) tablet Take 1 tablet by mouth every morning.   Marland Kitchen omeprazole (PRILOSEC) 40 MG capsule Take 40 mg by mouth daily.   . ondansetron (ZOFRAN) 4 MG tablet Take 1 tablet (4 mg total) by mouth every 8 (eight) hours as needed for nausea or vomiting.  . pregabalin (LYRICA) 200 MG capsule Take 200 mg by mouth 2 (two) times daily.   . saline (AYR) GEL Place 1 application into the nose every 4 (four) hours as needed (dryness/irritation).  . SYMBICORT 160-4.5 MCG/ACT inhaler Inhale 1 puff into the lungs 2 (two) times daily.  Marland Kitchen diltiazem (CARDIZEM CD) 180 MG 24 hr capsule Take 1 capsule (180 mg total) by mouth daily.  . furosemide (LASIX) 40 MG tablet Take 1 tablet (40 mg total) by mouth daily.  . OXYGEN Inhale 4 L into the lungs continuous.  . potassium chloride SA (K-DUR,KLOR-CON)  20 MEQ tablet Take 1 tablet (20 mEq total) by mouth daily.  . predniSONE (DELTASONE) 20 MG tablet Take 2 tablets (40 mg total) by mouth daily with breakfast.  . Respiratory Therapy Supplies (FLUTTER) DEVI As directed.  . tamsulosin (FLOMAX) 0.4 MG CAPS capsule Take 1 capsule (0.4 mg total) by mouth daily after supper.    FAMILY HISTORY:  His  indicated that his mother is alive. He indicated that his father is deceased. He indicated that his sister is alive. He indicated that all of his four brothers are alive.   SOCIAL HISTORY: He  reports that he has quit smoking. His smoking use included cigarettes. He has never used smokeless tobacco. He reports that he drinks alcohol. He reports that he has current or past drug history. Drugs: "Crack" cocaine and Cocaine.  REVIEW OF SYSTEMS:   Per HPI  SUBJECTIVE:  No c/o respiratory distress on vent or after 15 min PSV 7/5.  VITAL SIGNS: BP (!) 165/123   Pulse (!) 103   Temp 99.6 F (37.6 C) (Axillary)   Resp (!) 24   Ht 5' 7.5" (1.715 m)   Wt 56.2 kg (123 lb 14.4 oz)   SpO2 100%   BMI 19.12 kg/m   HEMODYNAMICS:    VENTILATOR SETTINGS: Vent Mode: PRVC FiO2 (%):  [40 %-100 %] 40 % Set Rate:  [18 bmp] 18 bmp Vt Set:  [550 mL] 550 mL PEEP:  [5 cmH20] 5 cmH20 Plateau Pressure:  [23 cmH20-25 cmH20] 25 cmH20  INTAKE / OUTPUT: I/O last 3 completed shifts: In: 900.1 [I.V.:350.1; IV Piggyback:550] Out: 1265 [Urine:1000; Emesis/NG output:250; Chest Tube:15]  PHYSICAL EXAMINATION: General:  WD/WN BM NARD Neuro:  A&O. No focal deficits HEENT:  [place]/AT PERRL, EOMI; ETT/OGT in situ Cardiovascular:  Tachy but regular.  Lungs:  Scattered rhonchi with prolonged expiration Abdomen:  Supple, with mild tenderness; +BS Musculoskeletal:  No active joints Skin:  No C,C,E  LABS:  BMET Recent Labs  Lab 05/05/17 0329 05/06/17 0332 05/07/17 0328  NA 134* 138 137  K 5.1 4.6 4.2  CL 84* 86* 88*  CO2 36* 38* 35*  BUN 21* 29* 28*  CREATININE 0.73 0.87 0.83  GLUCOSE 127* 133* 132*    Electrolytes Recent Labs  Lab 05/05/17 0329 05/06/17 0332 05/07/17 0328  CALCIUM 9.8 9.7 9.0  MG 1.9 1.7 2.0  PHOS 3.3 3.1 2.9    CBC Recent Labs  Lab 05/05/17 0329 05/06/17 0332 05/07/17 0328  WBC 13.1* 23.6* 13.6*  HGB 12.9* 11.7* 11.0*  HCT 43.3 39.9 36.2*  PLT 351 366 282     Coag's No results for input(s): APTT, INR in the last 168 hours.  Sepsis Markers No results for input(s): LATICACIDVEN, PROCALCITON, O2SATVEN in the last 168 hours.  ABG Recent Labs  Lab 05/06/17 0203 05/06/17 0314 05/07/17 0300  PHART 7.276* 7.393 7.436  PCO2ART 103* 69.7* 62.4*  PO2ART 116* 93.7 70.7*    Liver Enzymes No results for input(s): AST, ALT, ALKPHOS, BILITOT, ALBUMIN in the last 168 hours.  Cardiac Enzymes No results for input(s): TROPONINI, PROBNP in the last 168 hours.  Glucose Recent Labs  Lab 05/03/17 1646 05/06/17 1615 05/06/17 1908 05/06/17 2302 05/07/17 0317 05/07/17 0812  GLUCAP 163* 157* 144* 161* 129* 144*    Imaging Dg Abd 1 View  Result Date: 05/06/2017 CLINICAL DATA:  Orogastric tube placement. EXAM: ABDOMEN - 1 VIEW COMPARISON:  Radiograph of 08-May-2017. FINDINGS: Distal tip of enteric tube is seen in the  expected position of the stomach. No abnormal bowel dilatation is noted. IMPRESSION: Distal tip of enteric tube seen in the stomach. Electronically Signed   By: Lupita Raider, M.D.   On: 05/06/2017 11:59   Dg Chest Port 1 View  Result Date: 05/07/2017 CLINICAL DATA:  Evaluate ETT. EXAM: PORTABLE CHEST 1 VIEW COMPARISON:  May 06, 2017 FINDINGS: The ETT and right chest tube are stable. An NG tube terminates below today's film. No pneumothorax. Stable bilateral pulmonary infiltrates. No other interval changes. IMPRESSION: 1. Stable support apparatus as above. 2. Stable bilateral pulmonary infiltrates and small effusions. Electronically Signed   By: Gerome Sam III M.D   On: 05/07/2017 09:01   Dg Chest Port 1 View  Result Date: 05/06/2017 CLINICAL DATA:  Endotracheal tube placement EXAM: PORTABLE CHEST 1 VIEW COMPARISON:  05/06/2017 FINDINGS: Endotracheal tube in good position. NG tube in the stomach. Right chest tube in place. Chest tube is been advanced with the side hole overlying the thoracic cavity. No pneumothorax Bilateral  airspace disease unchanged. Small right effusion unchanged. IMPRESSION: Endotracheal tube in good position. Bilateral airspace disease unchanged. No pneumothorax. Electronically Signed   By: Marlan Palau M.D.   On: 05/06/2017 11:59     STUDIES:  PCXR 4/25 as described  CULTURES: No recent  ANTIBIOTICS: Doxycycline  SIGNIFICANT EVENTS: Intubated yesterday for respiratory failure  LINES/TUBES: ETT  DISCUSSION: Patient has significant underlying parenchymal lung and airway disease secondary to his sarcoidosis and tobacco hx, with bullous disease leading to his current ptx. Not clear if the need for intubation yesterday was influenced by the CT becoming malpositioned.  ASSESSMENT / PLAN:  PULMONARY A: Respiratory failure secondary to sarcoid, OAD and ptx. P:   SBT with PSV 7/5. If tol, will consider extubation to BiPAP  CARDIOVASCULAR A:  Type II AMI P:  If able to extubate will resume cardizem po  RENAL A:   BMP essentially OK, esp K+ P:   Trend BMP  GASTROINTESTINAL A:   Not active P:   GI prophylaxis while on vent  HEMATOLOGIC A:   Mild [place] anemia likely related to current illness P:  Trend CBC  INFECTIOUS A:   It's unclear whether the patient has acute parenchymal disease as the infiltrates are not significantly different when c/w the film from 02/03/2017, and could just represent his burnt out sarcoid. P:   Cont doxy for now but would change to Levaquin for any clinical worsening.  ENDOCRINE A:   Glucoses acceptable now but on very high doses of corticosteroids   P:   Would decrease Solumedrol to 1-2x/day should hyperglycemia becomes a problem.  NEUROLOGIC A:   No acute concerns P:   Follow  Critical care time: 40 min   Pulmonary and Critical Care Medicine [facility] Pager: [phone]  05/07/2017, 9:39 AM

## 2017-05-08 LAB — TROPONIN I: Troponin I: <0.03 ng/mL (ref <0.03)

## 2017-05-08 LAB — GLUCOSE, CAPILLARY
GLUCOSE-CAPILLARY: 149 mg/dL — AB (ref 65–99)
Glucose-Capillary: 150 mg/dL — ABNORMAL HIGH (ref 65–99)
Glucose-Capillary: 144 mg/dL — ABNORMAL HIGH (ref 65–99)
Glucose-Capillary: 150 mg/dL — ABNORMAL HIGH (ref 65–99)
Glucose-Capillary: 119 mg/dL — ABNORMAL HIGH (ref 65–99)
Glucose-Capillary: 131 mg/dL — ABNORMAL HIGH (ref 65–99)

## 2017-05-08 MED ORDER — MORPHINE SULFATE (PF) 4 MG/ML IV SOLN
1.0000 mg | Freq: Once | INTRAVENOUS | Status: AC
  Administered 2017-05-08: 1 mg via INTRAVENOUS
  Filled 2017-05-08: qty 1

## 2017-05-08 MED ORDER — METHYLPREDNISOLONE SODIUM SUCC 125 MG IJ SOLR
60.0000 mg | Freq: Two times a day (BID) | INTRAMUSCULAR | Status: DC
Start: 2017-05-08 — End: 2017-05-09
  Administered 2017-05-08: 60 mg via INTRAVENOUS
  Filled 2017-05-08: qty 2

## 2017-05-08 NOTE — Progress Notes (Signed)
PULMONARY / CRITICAL CARE MEDICINE   Name: [name] MRN: [id number] DOB: 1965-05-26    ADMISSION DATE:  04/18/2017 CONSULTATION DATE:  04/11/2017  REFERRING MD:  Rancour  CHIEF COMPLAINT:  Dyspnea  HISTORY OF PRESENT ILLNESS:   This is a 52 yr old male, smoker, chronic cocaine/opiate abuse, with PMH of severe obstructive lung disease, burnt out sarcoid with large bullae, OSA on CPAP, chronic diastolic CHF,recently discharged on 04/21/17 presented on 4/12 s/p cocaine use with dyspnea and chest pain,intubated on mechanical ventilation s/p R chest tube for pneumothorax.Developed subcutaneous emphysema 4/13 when chest tube migrated out of thoracic cavity. Improved when new tube placed. CT to water seal 4/22, with increase in pneumothorax 4/23 imaging. PCXR today shows the Ptx has resolved with the repositioned CT. He had been on BiPAP but decompensated on the night of 4/25 and intubated 4/26. Was able to be extubated 4/27 to BiPAP.  PAST MEDICAL HISTORY :  He  has a past medical history of Bronchitis, CHF (congestive heart failure) (HCC), Chronic respiratory failure (HCC), COPD (chronic obstructive pulmonary disease) (HCC), GERD (gastroesophageal reflux disease), Hypertension, OSA on CPAP, Pneumonia, and Sarcoidosis.  PAST SURGICAL HISTORY: He  has a past surgical history that includes Rotator cuff repair; arm surgery ; and Video assisted thoracoscopy (vats)/thorocotomy (Left, 07/17/2012).  Allergies  Allergen Reactions  . Clindamycin/Lincomycin Anaphylaxis and Swelling    Made face and tongue swell    No current facility-administered medications on file prior to encounter.    Current Outpatient Medications on File Prior to Encounter  Medication Sig  . albuterol (PROVENTIL HFA;VENTOLIN HFA) 108 (90 Base) MCG/ACT inhaler Inhale 2 puffs into the lungs every 6 (six) hours as needed for wheezing or shortness of breath.  Marland Kitchen albuterol (PROVENTIL) (2.5 MG/3ML) 0.083% nebulizer solution USE 1  VIAL IN NEBULIZER EVERY 6 HOURS AS NEEDED FOR WHEEZING  . calcium-vitamin D (OSCAL WITH D) 500-200 MG-UNIT tablet Take 1 tablet by mouth daily.  . cyclobenzaprine (FLEXERIL) 10 MG tablet Take 10 mg by mouth 3 (three) times daily as needed for muscle spasms.   . diphenhydrAMINE (BENADRYL) 25 MG tablet Take 25 mg by mouth every 6 (six) hours as needed for itching or allergies.  . Ergocalciferol (VITAMIN D2) 400 units TABS Take 400 Units by mouth daily.  . ferrous sulfate 325 (65 FE) MG tablet Take 325 mg by mouth 2 (two) times daily with a meal.  . ipratropium-albuterol (DUONEB) 0.5-2.5 (3) MG/3ML SOLN Take 3 mLs by nebulization every 4 (four) hours as needed. (Patient taking differently: Take 3 mLs by nebulization every 4 (four) hours as needed (SOB, wheezing). )  . Multiple Vitamins-Minerals (MULTIVITAMIN WITH MINERALS) tablet Take 1 tablet by mouth every morning.   Marland Kitchen omeprazole (PRILOSEC) 40 MG capsule Take 40 mg by mouth daily.   . ondansetron (ZOFRAN) 4 MG tablet Take 1 tablet (4 mg total) by mouth every 8 (eight) hours as needed for nausea or vomiting.  . pregabalin (LYRICA) 200 MG capsule Take 200 mg by mouth 2 (two) times daily.   . saline (AYR) GEL Place 1 application into the nose every 4 (four) hours as needed (dryness/irritation).  . SYMBICORT 160-4.5 MCG/ACT inhaler Inhale 1 puff into the lungs 2 (two) times daily.  Marland Kitchen diltiazem (CARDIZEM CD) 180 MG 24 hr capsule Take 1 capsule (180 mg total) by mouth daily.  . furosemide (LASIX) 40 MG tablet Take 1 tablet (40 mg total) by mouth daily.  . OXYGEN Inhale 4 L into the lungs  continuous.  . potassium chloride SA (K-DUR,KLOR-CON) 20 MEQ tablet Take 1 tablet (20 mEq total) by mouth daily.  . predniSONE (DELTASONE) 20 MG tablet Take 2 tablets (40 mg total) by mouth daily with breakfast.  . Respiratory Therapy Supplies (FLUTTER) DEVI As directed.  . tamsulosin (FLOMAX) 0.4 MG CAPS capsule Take 1 capsule (0.4 mg total) by mouth daily after supper.     FAMILY HISTORY:  His indicated that his mother is alive. He indicated that his father is deceased. He indicated that his sister is alive. He indicated that all of his four brothers are alive.   SOCIAL HISTORY: He  reports that he has quit smoking. His smoking use included cigarettes. He has never used smokeless tobacco. He reports that he drinks alcohol. He reports that he has current or past drug history. Drugs: "Crack" cocaine and Cocaine.  SUBJECTIVE:  Did well overnight on BiPAP. Currently on 5L [place] with sat100%  VITAL SIGNS: BP (!) 167/89   Pulse 75   Temp (!) 97.5 F (36.4 C) (Oral)   Resp 20   Ht 5' 7.5" (1.715 m)   Wt 56.2 kg (123 lb 14.4 oz)   SpO2 100%   BMI 19.12 kg/m   VENTILATOR SETTINGS: Vent Mode: BIPAP FiO2 (%):  [40 %] 40 % Set Rate:  [10 bmp] 10 bmp PEEP:  [5 cmH20] 5 cmH20  INTAKE / OUTPUT: I/O last 3 completed shifts: In: 555.8 [I.V.:555.8] Out: 2650 [Urine:2650]  PHYSICAL EXAMINATION: General:  WD/WN BM NARD Neuro:  A&O x3. No focal deficits HEENT:  [place]/AT PERRL EOMI O-P neg Cardiovascular:  RRR. No m/r/g Lungs:  Scattered rhonchi with prolonged expir Abdomen:  Supple NT, +BS Musculoskeletal:  No inflamed joints Skin:  No C,C,E  LABS:  BMET Recent Labs  Lab 05/05/17 0329 05/06/17 0332 05/07/17 0328  NA 134* 138 137  K 5.1 4.6 4.2  CL 84* 86* 88*  CO2 36* 38* 35*  BUN 21* 29* 28*  CREATININE 0.73 0.87 0.83  GLUCOSE 127* 133* 132*    Electrolytes Recent Labs  Lab 05/05/17 0329 05/06/17 0332 05/07/17 0328  CALCIUM 9.8 9.7 9.0  MG 1.9 1.7 2.0  PHOS 3.3 3.1 2.9    CBC Recent Labs  Lab 05/05/17 0329 05/06/17 0332 05/07/17 0328  WBC 13.1* 23.6* 13.6*  HGB 12.9* 11.7* 11.0*  HCT 43.3 39.9 36.2*  PLT 351 366 282    Coag's No results for input(s): APTT, INR in the last 168 hours.  Sepsis Markers No results for input(s): LATICACIDVEN, PROCALCITON, O2SATVEN in the last 168 hours.  ABG Recent Labs  Lab  05/06/17 0314 05/07/17 0300 05/07/17 1925  PHART 7.393 7.436 7.361  PCO2ART 69.7* 62.4* 75.0*  PO2ART 93.7 70.7* 77.3*    Liver Enzymes No results for input(s): AST, ALT, ALKPHOS, BILITOT, ALBUMIN in the last 168 hours.  Cardiac Enzymes No results for input(s): TROPONINI, PROBNP in the last 168 hours.  Glucose Recent Labs  Lab 05/07/17 1136 05/07/17 1522 05/07/17 1914 05/08/17 0314 05/08/17 0830 05/08/17 1212  GLUCAP 137* 101* 111* 150* 149* 144*    Imaging No results found.   STUDIES:  None pending  CULTURES: No recent  ANTIBIOTICS: Doxycycline  SIGNIFICANT EVENTS: Extubated yesterday  LINES/TUBES: Periph IV  DISCUSSION: Patient has significant underlying parenchymal lung and airway disease secondary to his sarcoidosis and tobacco hx, with bullous disease leading to his current ptx. He has been on water seal with no air leak with a cough.   ASSESSMENT /  PLAN:  PULMONARY A: Respiratory failure secondary to sarcoid, OAD and ptx P:   SoluMedrol freq cut in half. Will obtain PCXR in AM for consideration of pulling chest tube Titrate nasal O2 to keep sat 90-92  CARDIOVASCULAR A:  Type II AMI P:  Cardizem for rate and BP control  RENAL A:   Compensated resp acidosis P:   Trend BMP  INFECTIOUS A:   Clinically improved P:   PCXR in  AM  ENDOCRINE A:   No problem   P:   Follow  Critical care time:30 min    Pulmonary and Critical Care Medicine [facility] Pager: [phone]  05/08/2017, 2:57 PM

## 2017-05-08 NOTE — Progress Notes (Signed)
Pt with no urine output since foley removal this morning. Pt bladderscanned and revealed 418cc. Verbal order received from Crim MD to in and out cath. Orders carried out. Pt with 400cc of clear yellow urine. Condom cath reapplied. Will continue to monitor.

## 2017-05-08 NOTE — Progress Notes (Signed)
Pt complains of 10/10 sharp stabbing pain in Right anterior chest. MD paged and made aware. Orders received to give 1 mg IV morphine, EKG, and serial troponins. Orders carried out. Will continue to monitor.

## 2017-05-09 ENCOUNTER — Inpatient Hospital Stay (HOSPITAL_COMMUNITY): Payer: 59

## 2017-05-09 DIAGNOSIS — J9600 Acute respiratory failure, unspecified whether with hypoxia or hypercapnia: Secondary | ICD-10-CM

## 2017-05-09 LAB — BASIC METABOLIC PANEL
ANION GAP: 12 (ref 5–15)
BUN: 33 mg/dL — ABNORMAL HIGH (ref 6–20)
CALCIUM: 8.9 mg/dL (ref 8.9–10.3)
CO2: 32 mmol/L (ref 22–32)
Chloride: 94 mmol/L — ABNORMAL LOW (ref 101–111)
Creatinine, Ser: 0.65 mg/dL (ref 0.61–1.24)
GLUCOSE: 164 mg/dL — AB (ref 65–99)
POTASSIUM: 4.1 mmol/L (ref 3.5–5.1)
Sodium: 138 mmol/L (ref 135–145)

## 2017-05-09 LAB — TROPONIN I

## 2017-05-09 LAB — CBC
HEMATOCRIT: 36.4 % — AB (ref 39.0–52.0)
HEMOGLOBIN: 11.1 g/dL — AB (ref 13.0–17.0)
MCH: 25.3 pg — ABNORMAL LOW (ref 26.0–34.0)
MCHC: 30.5 g/dL (ref 30.0–36.0)
MCV: 83.1 fL (ref 78.0–100.0)
Platelets: 307 10*3/uL (ref 150–400)
RBC: 4.38 MIL/uL (ref 4.22–5.81)
RDW: 14 % (ref 11.5–15.5)
WBC: 6.9 10*3/uL (ref 4.0–10.5)

## 2017-05-09 LAB — CULTURE, RESPIRATORY
CULTURE: NORMAL
SPECIAL REQUESTS: NORMAL

## 2017-05-09 LAB — GLUCOSE, CAPILLARY
GLUCOSE-CAPILLARY: 158 mg/dL — AB (ref 65–99)
GLUCOSE-CAPILLARY: 134 mg/dL — AB (ref 65–99)
GLUCOSE-CAPILLARY: 130 mg/dL — AB (ref 65–99)
GLUCOSE-CAPILLARY: 116 mg/dL — AB (ref 65–99)
Glucose-Capillary: 180 mg/dL — ABNORMAL HIGH (ref 65–99)
Glucose-Capillary: 125 mg/dL — ABNORMAL HIGH (ref 65–99)

## 2017-05-09 MED ORDER — DILTIAZEM HCL 60 MG PO TABS
60.0000 mg | ORAL_TABLET | Freq: Four times a day (QID) | ORAL | Status: DC
  Administered 2017-05-09 – 2017-05-11 (×9): 60 mg via ORAL
  Filled 2017-05-09 (×9): qty 1

## 2017-05-09 MED ORDER — BIOTENE DRY MOUTH MT LIQD
15.0000 mL | Freq: Four times a day (QID) | OROMUCOSAL | Status: DC | PRN
  Administered 2017-05-13: 15 mL via OROMUCOSAL
  Filled 2017-05-09: qty 15

## 2017-05-09 MED ORDER — SIMETHICONE 80 MG PO CHEW
80.0000 mg | CHEWABLE_TABLET | Freq: Four times a day (QID) | ORAL | Status: DC | PRN
  Administered 2017-05-09 – 2017-05-11 (×3): 80 mg via ORAL
  Filled 2017-05-09 (×4): qty 1

## 2017-05-09 MED ORDER — METHYLPREDNISOLONE SODIUM SUCC 40 MG IJ SOLR
40.0000 mg | Freq: Two times a day (BID) | INTRAMUSCULAR | Status: DC
  Administered 2017-05-09 – 2017-05-16 (×15): 40 mg via INTRAVENOUS
  Filled 2017-05-09 (×15): qty 1

## 2017-05-09 NOTE — Progress Notes (Signed)
1115 Patient was assisted to the bedside commode. During this time Zoe Suggs RN attempted to wipe patient's back w/ soap and washcloth. Pt requested water on washcloth to be warmer. This RN turned water on and stated that the water was warming up but might take a minute. Pt became aggravated and began raising voice at RN stating that he "paid the bill and the water should be left on." While allowing water to warm, chest tube was removed without complication. Following chest tube removal patient became aggravated once again about the water on the washcloth and stated that he needed a new nurse because he "will slap the shit out of her (in reference to this RN) if he didn't get a new nurse." RN verablized appreciation for communicating how he felt and stated that he and I will work together to make sure that he is receiving the care that he would like. Pt continued to become increasingly aggravated and verbally aggressive. RN left the room in order to deescalate the situation, and Bri McNabb, RN entered room to assist in transferring patient to chair.

## 2017-05-09 NOTE — Progress Notes (Signed)
Physical Therapy Treatment Patient Details Name: [name] MRN: [id number] DOB: April 03, 1965 Today's Date: 05/09/2017    History of Present Illness 52 yo male admitted with acute respiratory failure, R pneumothorax. Hx of drug abuse, OSA, CHF, sarcoidosis, COPD-O2 dep 4L at baseline. CT pulled 05/09/17.    PT Comments    The patient presents with a change in MS and significant decline in functional mobility since last visit.The patient was assisted by nursing to recliner, patient declined to practice standing activities siting that he was too dizzy. The patient may require more support at home. Patient may need 24/7 caregivers.     Follow Up Recommendations  Home health PT;Supervision - Intermittent     Equipment Recommendations  None recommended by PT    Recommendations for Other Services       Precautions / Restrictions Precautions Precautions: Fall Precaution Comments: ; monitor O2 sats    Mobility  Bed Mobility               General bed mobility comments: in recliner  Transfers                 General transfer comment: patient declined to stand due to dizziness  Ambulation/Gait                 Stairs             Wheelchair Mobility    Modified Rankin (Stroke Patients Only)       Balance                                            Cognition Arousal/Alertness: Awake/alert   Overall Cognitive Status: Impaired/Different from baseline Area of Impairment: Attention;Awareness                   Current Attention Level: Sustained       Awareness: Emergent   General Comments: patient is anxious, siting that he is dizzy just to look down. extra time to perform simple exercises in the chair. Was  focussed on the wet feeling of his legs and feet being cold.       Exercises General Exercises - Upper Extremity Shoulder Flexion: AROM;Both;5 reps Shoulder ABduction: AROM;Both;5 reps Shoulder Horizontal  ABduction: AROM;Both;5 reps General Exercises - Lower Extremity Ankle Circles/Pumps: AROM;Both;10 reps Quad Sets: AROM;Both;10 reps Hip ABduction/ADduction: AAROM;Both;10 reps    General Comments        Pertinent Vitals/Pain Faces Pain Scale: Hurts little more Pain Location: pain with coughing Pain Descriptors / Indicators: Sharp;Discomfort Pain Intervention(s): Monitored during session    Home Living                      Prior Function            PT Goals (current goals can now be found in the care plan section)      Frequency    Min 3X/week      PT Plan Current plan remains appropriate    Co-evaluation              AM-PAC PT "6 Clicks" Daily Activity  Outcome Measure  Difficulty turning over in bed (including adjusting bedclothes, sheets and blankets)?: Unable Difficulty moving from lying on back to sitting on the side of the bed? : Unable Difficulty sitting down on and standing up from a chair  with arms (e.g., wheelchair, bedside commode, etc,.)?: Unable Help needed moving to and from a bed to chair (including a wheelchair)?: Total Help needed walking in hospital room?: Total Help needed climbing 3-5 steps with a railing? : Total 6 Click Score: 6    End of Session Equipment Utilized During Treatment: Oxygen Activity Tolerance: Other (comment)(compains of dizziness) Patient left: in chair;with call bell/phone within reach;with family/visitor present Nurse Communication: Mobility status PT Visit Diagnosis: Difficulty in walking, not elsewhere classified (R26.2);Other abnormalities of gait and mobility (R26.89)     Time: 1358-1430 PT Time Calculation (min) (ACUTE ONLY): 32 min  Charges:  $Therapeutic Exercise: 23-37 mins                    G CodesBlanchard Kelch PT [phone]   Rada Hay 05/09/2017, 3:12 PM

## 2017-05-09 NOTE — Progress Notes (Signed)
PULMONARY / CRITICAL CARE MEDICINE   Name: [name] MRN: [id number] DOB: Sep 15, 1965    ADMISSION DATE:  05/21/2017 CONSULTATION DATE:  May 21, 2017  REFERRING MD:  Rancour  CHIEF COMPLAINT:  Dyspnea  HISTORY OF PRESENT ILLNESS:   This is a 52 yr old male, smoker, chronic cocaine/opiate abuse, with PMH of severe obstructive lung disease, burnt out sarcoid with large bullae, OSA on CPAP, chronic diastolic CHF,recently discharged on 04/21/17 presented on 4/12 s/p cocaine use with dyspnea and chest pain,intubated on mechanical ventilation s/p R chest tube for pneumothorax.Developed subcutaneous emphysema 4/13 when chest tube migrated out of thoracic cavity. Improved when new tube placed. CT to water seal 4/22, with increase in pneumothorax 4/23 imaging.  He decompensated on the night of 4/25 and intubated 4/26. Was able to be extubated 4/27 to BiPAP.   SUBJECTIVE:  used BiPAP overnight . Currently on 5L [place]  C/o dry mouth chest tube to water seal Breathing 'ok'  VITAL SIGNS: BP 122/87   Pulse 65   Temp (!) 97.3 F (36.3 C) (Axillary)   Resp 15   Ht 5' 7.5" (1.715 m)   Wt 134 lb 14.7 oz (61.2 kg)   SpO2 95%   BMI 20.82 kg/m   VENTILATOR SETTINGS: Vent Mode: BIPAP FiO2 (%):  [40 %] 40 % Set Rate:  [10 bmp] 10 bmp PEEP:  [5 cmH20] 5 cmH20  INTAKE / OUTPUT: I/O last 3 completed shifts: In: 906.7 [P.O.:150; I.V.:756.7] Out: 1845 [Urine:1845]  PHYSICAL EXAMINATION: General:  No distress on 5L [place], calm, gets animated & slurred while talking Neuro:  oriente to name, place, & person, non focal ,confused 'due to medication' HEENT:  [place]/AT PERRL EOMI O-P neg Cardiovascular:  RRR. No m/r/g Lungs:  Scattered rhonchi with prolonged expir Abdomen:  Supple NT, +BS Musculoskeletal:  No inflamed joints Skin:  No rash  LABS:  BMET Recent Labs  Lab 05/06/17 0332 05/07/17 0328 05/09/17 0341  NA 138 137 138  K 4.6 4.2 4.1  CL 86* 88* 94*  CO2 38* 35* 32  BUN 29* 28* 33*   CREATININE 0.87 0.83 0.65  GLUCOSE 133* 132* 164*    Electrolytes Recent Labs  Lab 05/05/17 0329 05/06/17 0332 05/07/17 0328 05/09/17 0341  CALCIUM 9.8 9.7 9.0 8.9  MG 1.9 1.7 2.0  --   PHOS 3.3 3.1 2.9  --     CBC Recent Labs  Lab 05/06/17 0332 05/07/17 0328 05/09/17 0341  WBC 23.6* 13.6* 6.9  HGB 11.7* 11.0* 11.1*  HCT 39.9 36.2* 36.4*  PLT 366 282 307    Coag's No results for input(s): APTT, INR in the last 168 hours.  Sepsis Markers No results for input(s): LATICACIDVEN, PROCALCITON, O2SATVEN in the last 168 hours.  ABG Recent Labs  Lab 05/06/17 0314 05/07/17 0300 05/07/17 1925  PHART 7.393 7.436 7.361  PCO2ART 69.7* 62.4* 75.0*  PO2ART 93.7 70.7* 77.3*    Liver Enzymes No results for input(s): AST, ALT, ALKPHOS, BILITOT, ALBUMIN in the last 168 hours.  Cardiac Enzymes Recent Labs  Lab 05/08/17 1757 05/09/17 0341  TROPONINI <0.03 <0.03    Glucose Recent Labs  Lab 05/08/17 1212 05/08/17 1552 05/08/17 1936 05/08/17 2325 05/09/17 0340 05/09/17 0803  GLUCAP 144* 150* 119* 131* 158* 134*    Imaging Dg Chest Port 1 View  Result Date: 05/09/2017 CLINICAL DATA:  Pneumothorax on right. EXAM: PORTABLE CHEST 1 VIEW COMPARISON:  Radiograph of May 07, 2017. FINDINGS: Stable cardiomediastinal silhouette. Right-sided chest tube is noted which  is partially withdrawn since prior exam, with side hole seen outside of thoracic space. No pneumothorax is noted. Postoperative changes are noted in left lung apex. Stable bilateral lung opacities are noted, left greater than right. Stable bibasilar scarring is noted. Endotracheal and nasogastric tubes have been removed. IMPRESSION: Endotracheal and nasogastric tubes have been removed. Right-sided chest tube has been partially withdrawn with side hole outside of thoracic space. No definite pneumothorax is seen. Stable bilateral lung opacities are noted in bibasilar scarring as described above. Electronically Signed    By: Lupita Raider, M.D.   On: 05/09/2017 07:14   -personally reviewed  STUDIES:  None pending  CULTURES: No recent  ANTIBIOTICS: Doxycycline  SIGNIFICANT EVENTS:   LINES/TUBES: ETT 4/26 >> 4/27  DISCUSSION: Patient has significant underlying parenchymal lung and airway disease secondary to his sarcoidosis and tobacco hx, with bullous disease leading to his current ptx. He has been on water seal with no air leak with a cough.   ASSESSMENT / PLAN:  PULMONARY A: Acute Respiratory failure secondary to sarcoid, bullous emphysema  Rt pneumothorax P:   BiPAP prn only SoluMedrol 40 q 12 dc chest tube Titrate nasal O2 to keep sat 90-92 complete doxy x 7ds  CARDIOVASCULAR A:  elevated trop SVT  P:  Cardizem to oral 60 q 8  RENAL A:   Compensated resp acidosis P:   Trend BMP  Change to SD status  Cyril Mourning MD. FCCP. [facility] Pager [phone] If no response call [phone]    05/09/2017, 9:21 AM

## 2017-05-09 NOTE — Progress Notes (Signed)
eLink Physician-Brief Progress Note Patient Name: [name] DOB: June 29, 1965 MRN: [id number]   Date of Service  05/09/2017  HPI/Events of Note  Patient c/o abdominal pain. Related to gas? Already on Colace and Senakot.   eICU Interventions  Will order: 1. Simethicone 80 mg PO Q 6 hours PRN gas/flatuence.     Intervention Category Major Interventions: Other:  [name] 05/09/2017, 8:06 PM

## 2017-05-10 ENCOUNTER — Inpatient Hospital Stay (HOSPITAL_COMMUNITY): Payer: 59

## 2017-05-10 LAB — GLUCOSE, CAPILLARY
GLUCOSE-CAPILLARY: 127 mg/dL — AB (ref 65–99)
GLUCOSE-CAPILLARY: 134 mg/dL — AB (ref 65–99)
Glucose-Capillary: 157 mg/dL — ABNORMAL HIGH (ref 65–99)
Glucose-Capillary: 122 mg/dL — ABNORMAL HIGH (ref 65–99)
Glucose-Capillary: 210 mg/dL — ABNORMAL HIGH (ref 65–99)

## 2017-05-10 MED ORDER — BOOST / RESOURCE BREEZE PO LIQD CUSTOM
1.0000 | Freq: Two times a day (BID) | ORAL | Status: DC
  Administered 2017-05-10 – 2017-05-11 (×3): 1 via ORAL

## 2017-05-10 MED ORDER — GUAIFENESIN ER 600 MG PO TB12
600.0000 mg | ORAL_TABLET | Freq: Two times a day (BID) | ORAL | Status: DC | PRN
  Administered 2017-05-10 – 2017-05-17 (×11): 600 mg via ORAL
  Filled 2017-05-10 (×12): qty 1

## 2017-05-10 MED ORDER — BUDESONIDE 0.5 MG/2ML IN SUSP
0.5000 mg | Freq: Two times a day (BID) | RESPIRATORY_TRACT | Status: DC
  Administered 2017-05-10 – 2017-05-17 (×15): 0.5 mg via RESPIRATORY_TRACT
  Filled 2017-05-10 (×15): qty 2

## 2017-05-10 NOTE — Progress Notes (Signed)
Nutrition Follow-up  DOCUMENTATION CODES:   Not applicable  INTERVENTION:  - Will order Boost Breeze BID, each supplement provides 250 kcal and 9 grams of protein - Continue to encourage PO intakes.  - RD will continue to monitor for additional nutrition-related needs.   NUTRITION DIAGNOSIS:   Inadequate oral intake related to acute illness, decreased appetite as evidenced by per patient/family report. -previously improved, decreased again over the past 4-5 days.   GOAL:   Patient will meet greater than or equal to 90% of their needs -previously met, unmet the past 4-5 days   MONITOR:   PO intake, Supplement acceptance, Weight trends, Labs  ASSESSMENT:   52 year-old male who was d/c'ed on 4/11 and presented to the ED on 4/12 with shortness of breath. History of COPD and lung cancer. Mental status declined in route to the hospital. EMS reported he was speaking to them initially. He was intubated emergently.  Weight previously trending down and now back up to admission weight. Pt on Heart Healthy diet. Last documented intakes are 75-100% of meals 4/21-4/25. No intakes documented since 4/25 when pt transferred to SDU. Pt seems to have some confusion during RD visit. He reports that he has had no solid foods for 3-4 weeks. Male at bedside states that this is accurate but then discloses that she just arrived in town on Friday (4/26) and has only been present at bedside since that time.   Pt reports eating a few grapes for breakfast this AM. He denies abdominal pain or nausea. Pt was previously ordered Ensure Enlive BID on 4/16-4/24 and drinking 1-2 bottles per day. On 4/24 MD requested Ensure be stopped d/t hyperkalemia and each bottle of Ensure providing over 1000 mg K. Serum K currently WDL and has slowly trended slightly down since 4/24.   Medications reviewed; 100 mg Colace BID, 40 mg oral Lasix/day, 40 mg Solu-medrol BID, daily multivitamin with minerals, 2 tablets Senokot/day.   Labs reviewed; CBGs: 127 and 134 mg/dL this AM, Cl: 94 mmol/L, BUN: 33 mg/dL.       Diet Order:   Diet Order           Diet Heart Room service appropriate? Yes; Fluid consistency: Thin  Diet effective now          EDUCATION NEEDS:   No education needs have been identified at this time  Skin:  Skin Assessment: Skin Integrity Issues: Skin Integrity Issues:: Incisions Incisions: R chest incision from 4/22  Last BM:  4/30  Height:   Ht Readings from Last 1 Encounters:  04/30/17 5' 7.5" (1.715 m)    Weight:   Wt Readings from Last 1 Encounters:  05/09/17 134 lb 14.7 oz (61.2 kg)    Ideal Body Weight:  67.27 kg  BMI:  Body mass index is 20.82 kg/m.  Estimated Nutritional Needs:   Kcal:  1950-2135 (32-35 kcal/kg)  Protein:  75-90 grams (1.2-1.5 grams/kg)  Fluid:  >/= 1.8 L/day      Jarome Matin, MS, RD, LDN, [facility] Inpatient Clinical Dietitian Pager # [phone] After hours/weekend pager # [phone]

## 2017-05-10 NOTE — Progress Notes (Signed)
PULMONARY / CRITICAL CARE MEDICINE   Name: [name] MRN: [id number] DOB: 1965/07/29    ADMISSION DATE:  April 23, 2017 CONSULTATION DATE:  04-23-17  REFERRING MD:  Rancour  CHIEF COMPLAINT:  Dyspnea  HISTORY OF PRESENT ILLNESS:   This is a 52 yr old male, smoker, chronic cocaine/opiate abuse, with PMH of severe obstructive lung disease, burnt out sarcoid with large bullae, OSA on CPAP, chronic diastolic CHF,recently discharged on 04/21/17 presented on 4/12 s/p cocaine use with dyspnea and chest pain,intubated on mechanical ventilation s/p R chest tube for pneumothorax.Developed subcutaneous emphysema 4/13 when chest tube migrated out of thoracic cavity. Improved when new tube placed. CT to water seal 4/22, with increase in pneumothorax 4/23 imaging.  He decompensated on the night of 4/25 and intubated 4/26. Was extubated 4/27 to BiPAP. Chest tube dc'd 4/29   SUBJECTIVE:  Sudden dyspnea & desaturation this am Improved with deep bathing & turning up O2  VITAL SIGNS: BP 130/88   Pulse (!) 126   Temp 98.4 F (36.9 C) (Oral)   Resp 18   Ht 5' 7.5" (1.715 m)   Wt 134 lb 14.7 oz (61.2 kg)   SpO2 100%   BMI 20.82 kg/m   VENTILATOR SETTINGS:    INTAKE / OUTPUT: I/O last 3 completed shifts: In: 764.3 [P.O.:390; I.V.:374.3] Out: 745 [Urine:745]  PHYSICAL EXAMINATION: General:  No distress on 5L [place], chronically ill appearing Neuro:  Oriented x 3, non focal  HEENT:  [place]/AT PERRL EOMI O-P neg Cardiovascular:  RRR. No m/r/g Lungs:  Scattered rhonchi with prolonged expiration, decreased rt  Abdomen:  Supple NT, +BS Musculoskeletal:  No inflamed joints Skin:  No rash  LABS:  BMET Recent Labs  Lab 05/06/17 0332 05/07/17 0328 05/09/17 0341  NA 138 137 138  K 4.6 4.2 4.1  CL 86* 88* 94*  CO2 38* 35* 32  BUN 29* 28* 33*  CREATININE 0.87 0.83 0.65  GLUCOSE 133* 132* 164*    Electrolytes Recent Labs  Lab 05/05/17 0329 05/06/17 0332 05/07/17 0328 05/09/17 0341   CALCIUM 9.8 9.7 9.0 8.9  MG 1.9 1.7 2.0  --   PHOS 3.3 3.1 2.9  --     CBC Recent Labs  Lab 05/06/17 0332 05/07/17 0328 05/09/17 0341  WBC 23.6* 13.6* 6.9  HGB 11.7* 11.0* 11.1*  HCT 39.9 36.2* 36.4*  PLT 366 282 307    Coag's No results for input(s): APTT, INR in the last 168 hours.  Sepsis Markers No results for input(s): LATICACIDVEN, PROCALCITON, O2SATVEN in the last 168 hours.  ABG Recent Labs  Lab 05/06/17 0314 05/07/17 0300 05/07/17 1925  PHART 7.393 7.436 7.361  PCO2ART 69.7* 62.4* 75.0*  PO2ART 93.7 70.7* 77.3*    Liver Enzymes No results for input(s): AST, ALT, ALKPHOS, BILITOT, ALBUMIN in the last 168 hours.  Cardiac Enzymes Recent Labs  Lab 05/08/17 1757 05/09/17 0341 05/09/17 0902  TROPONINI <0.03 <0.03 <0.03    Glucose Recent Labs  Lab 05/09/17 1917 05/09/17 2329 05/10/17 0350 05/10/17 0814 05/10/17 1150 05/10/17 1543  GLUCAP 125* 116* 127* 134* 157* 122*    Imaging Dg Chest 1 View  Result Date: 05/10/2017 CLINICAL DATA:  Shortness of breath EXAM: CHEST  1 VIEW COMPARISON:  05/09/2017 FINDINGS: Stable chronic parenchymal changes throughout the lungs. No visible pneumothorax. Small bilateral effusions. Heart is borderline in size. IMPRESSION: Stable chronic pulmonary parenchymal changes/airspace disease in the lungs bilaterally. Small bilateral pleural effusions. Electronically Signed   By: Charlett Nose M.D.  On: 05/10/2017 10:18   -personally reviewed  STUDIES:  None pending  CULTURES: No recent  ANTIBIOTICS: Doxycycline  SIGNIFICANT EVENTS:   LINES/TUBES: ETT 4/26 >> 4/27 Chest tube 4/12 >> 4/29  DISCUSSION: Patient has significant underlying parenchymal lung and airway disease secondary to his sarcoidosis and tobacco hx, with bullous disease leading to his current ptx. He has been on water seal with no air leak with a cough.   ASSESSMENT / PLAN:  Acute on chr  Respiratory failure secondary to sarcoid, bullous  emphysema  Rt pneumothorax, chest tube finally out 4/29 P:   BiPAP prn only Ct SoluMedrol 40 q 12 Titrate nasal O2 to keep sat 90-92 complete doxy x 7ds  Elevated trop SVT  P:  Cardizem to oral 60 q 8 - SR in 24h    Remain on SD status  Cyril Mourning MD. Tonny Bollman. [facility] Pager [phone] If no response call [phone]    05/10/2017, 4:11 PM

## 2017-05-11 ENCOUNTER — Inpatient Hospital Stay (HOSPITAL_COMMUNITY): Payer: 59

## 2017-05-11 DIAGNOSIS — G9340 Encephalopathy, unspecified: Secondary | ICD-10-CM

## 2017-05-11 LAB — BLOOD GAS, ARTERIAL
Acid-Base Excess: 9.6 mmol/L — ABNORMAL HIGH (ref 0.0–2.0)
Acid-Base Excess: 10.1 mmol/L — ABNORMAL HIGH (ref 0.0–2.0)
BICARBONATE: 38 mmol/L — AB (ref 20.0–28.0)
Bicarbonate: 37.7 mmol/L — ABNORMAL HIGH (ref 20.0–28.0)
DELIVERY SYSTEMS: POSITIVE
DRAWN BY: 225631
Drawn by: 225631
Expiratory PAP: 5
FIO2: 100
INSPIRATORY PAP: 25
O2 Content: 4 L/min
O2 Content: 15 L/min
O2 SAT: 99.8 %
O2 Saturation: 91.3 %
PATIENT TEMPERATURE: 97.7
PCO2 ART: 68.3 mmHg — AB (ref 32.0–48.0)
Patient temperature: 97.7
pCO2 arterial: 69.9 mmHg (ref 32.0–48.0)
pH, Arterial: 7.348 — ABNORMAL LOW (ref 7.350–7.450)
pH, Arterial: 7.361 (ref 7.350–7.450)
pO2, Arterial: 66.4 mmHg — ABNORMAL LOW (ref 83.0–108.0)
pO2, Arterial: 438 mmHg — ABNORMAL HIGH (ref 83.0–108.0)

## 2017-05-11 LAB — BASIC METABOLIC PANEL
Anion gap: 11 (ref 5–15)
BUN: 27 mg/dL — AB (ref 6–20)
CO2: 33 mmol/L — ABNORMAL HIGH (ref 22–32)
CREATININE: 0.64 mg/dL (ref 0.61–1.24)
Calcium: 8.7 mg/dL — ABNORMAL LOW (ref 8.9–10.3)
Chloride: 95 mmol/L — ABNORMAL LOW (ref 101–111)
GFR calc Af Amer: >60 mL/min (ref >60)
GLUCOSE: 120 mg/dL — AB (ref 65–99)
Potassium: 4.6 mmol/L (ref 3.5–5.1)
SODIUM: 139 mmol/L (ref 135–145)

## 2017-05-11 LAB — GLUCOSE, CAPILLARY
GLUCOSE-CAPILLARY: 103 mg/dL — AB (ref 65–99)
GLUCOSE-CAPILLARY: 138 mg/dL — AB (ref 65–99)
GLUCOSE-CAPILLARY: 144 mg/dL — AB (ref 65–99)
GLUCOSE-CAPILLARY: 196 mg/dL — AB (ref 65–99)
GLUCOSE-CAPILLARY: 90 mg/dL (ref 65–99)
Glucose-Capillary: 155 mg/dL — ABNORMAL HIGH (ref 65–99)

## 2017-05-11 LAB — CBC
HCT: 34.9 % — ABNORMAL LOW (ref 39.0–52.0)
Hemoglobin: 10.6 g/dL — ABNORMAL LOW (ref 13.0–17.0)
MCH: 25.1 pg — AB (ref 26.0–34.0)
MCHC: 30.4 g/dL (ref 30.0–36.0)
MCV: 82.7 fL (ref 78.0–100.0)
PLATELETS: 382 10*3/uL (ref 150–400)
RBC: 4.22 MIL/uL (ref 4.22–5.81)
RDW: 14.1 % (ref 11.5–15.5)
WBC: 10.9 10*3/uL — ABNORMAL HIGH (ref 4.0–10.5)

## 2017-05-11 MED ORDER — LORAZEPAM 2 MG/ML IJ SOLN
0.5000 mg | INTRAMUSCULAR | Status: DC | PRN
  Administered 2017-05-11 (×2): 1 mg via INTRAVENOUS
  Filled 2017-05-11 (×2): qty 1

## 2017-05-11 MED ORDER — METHYLPREDNISOLONE SODIUM SUCC 40 MG IJ SOLR
40.0000 mg | Freq: Once | INTRAMUSCULAR | Status: AC
  Administered 2017-05-11: 40 mg via INTRAVENOUS

## 2017-05-11 MED ORDER — SODIUM CHLORIDE 3 % IN NEBU
4.0000 mL | INHALATION_SOLUTION | Freq: Two times a day (BID) | RESPIRATORY_TRACT | Status: AC
  Administered 2017-05-11 – 2017-05-14 (×8): 4 mL via RESPIRATORY_TRACT
  Filled 2017-05-11 (×8): qty 4

## 2017-05-11 MED ORDER — HYDRALAZINE HCL 20 MG/ML IJ SOLN
10.0000 mg | INTRAMUSCULAR | Status: DC | PRN
  Administered 2017-05-11: 20 mg via INTRAVENOUS
  Filled 2017-05-11 (×2): qty 1

## 2017-05-11 MED ORDER — FLUMAZENIL 0.5 MG/5ML IV SOLN
INTRAVENOUS | Status: AC
  Administered 2017-05-11: 0.5 mg
  Filled 2017-05-11: qty 5

## 2017-05-11 MED ORDER — BISACODYL 5 MG PO TBEC: 10.0000 mg | DELAYED_RELEASE_TABLET | Freq: Every day | ORAL | Status: DC | PRN

## 2017-05-11 MED ORDER — POLYETHYLENE GLYCOL 3350 17 G PO PACK
17.0000 g | PACK | Freq: Two times a day (BID) | ORAL | Status: DC | PRN
  Administered 2017-05-12: 17 g via ORAL
  Filled 2017-05-11: qty 1

## 2017-05-11 MED ORDER — TRAZODONE HCL 50 MG PO TABS
50.0000 mg | ORAL_TABLET | Freq: Every evening | ORAL | Status: DC | PRN
  Filled 2017-05-11: qty 1

## 2017-05-11 MED ORDER — METHYLPREDNISOLONE SODIUM SUCC 40 MG IJ SOLR
INTRAMUSCULAR | Status: AC
  Filled 2017-05-11: qty 1

## 2017-05-11 MED ORDER — ARFORMOTEROL TARTRATE 15 MCG/2ML IN NEBU
15.0000 ug | INHALATION_SOLUTION | Freq: Two times a day (BID) | RESPIRATORY_TRACT | Status: DC
  Administered 2017-05-11 – 2017-05-17 (×13): 15 ug via RESPIRATORY_TRACT
  Filled 2017-05-11 (×13): qty 2

## 2017-05-11 MED ORDER — BUDESONIDE 0.5 MG/2ML IN SUSP: 0.5000 mg | Freq: Two times a day (BID) | RESPIRATORY_TRACT | Status: DC

## 2017-05-11 NOTE — Progress Notes (Signed)
I was called by [facility] to evaluate this patient for respiratory distress. On chart review, he is a 51yoM with hx COPD, Chronic Hypoxia (on 4L O2), Sarcoidosis, Chronic bullous disease, Diastolic CHF, Tobacco abuse, Cocaine abuse, and OSA on CPAP, who presented on 4/12 with SOB and was found to have a right sided pneumothorax requiring chest tube placement. He developed vent dependent respiratory failure and COPD exacerbation. He was extubated 05/07/17 and continued to have treatment for his COPD exacerbation and NSTEMI. Tonight he developed increasing SOB and CXR showed re-occurrence of his previous Right-sided Pneumothorax. There was no tension physiology on my review of film. On exam patient sitting on side of bed, on home BIPAP (25/5, with 4L O2), very anxious, awake/alert, Lungs CTA b/l but diminished throughout, no wheezing. Heart tachycardic with regular rhythm. No LE edema. Appears clinically euvolemic. Had recent ABG showing chronic well compensated hypercapnea. I discussed plan for pigtail chest tube with patient and his wife. Discussed risks/ benefits and obtained consent. Pigtail chest tube placed on right anterior axillary line in 4th intercostal space. The procedure itself went very well with no complications. Vital signs remained stable throughout procedure. Towards end of procedure patient noted to be much less responsive, withdrawing to pain only in response to deep sternal rub. Removed BIPAP, bagged patient with ambu bag for approx 5 minutes. Confirmed with RN that it was indeed only  Ativan that had been given. Obtained ABG which was unchanged from prior, showing no acute hypercapnea. Gave Flumazenil after which time patient awakened and was obeying commands, moving all extremities. Lungs CTA b/l. Post procedure CXR shows chest tube in appropriate position with lung re-inflated. Vital signs remain stable. Changed patient's BIPAP from home BIPAP to a hospital BIPAP so that we can better monitor his  tidal volumes. Will check EKG , Troponin, and BNP. If re-sedates, will need to intubate and at that time would want to get a Head CT as this is very unusual for such a tiny dose of ativan to cause such sedation.   60 minutes nonprocedural critical care time  Milana Obey, MD Pulmonary & Critical Care Medicine Pager: [phone]

## 2017-05-11 NOTE — Progress Notes (Signed)
eLink Physician-Brief Progress Note Patient Name: [name] DOB: 02/06/1965 MRN: [id number]   Date of Service  05/11/2017  HPI/Events of Note  Called by RN re: anxiety and dyspnea. Pt with recent chest tube removal. On BiPAP 25/5. Appears very labored and anxious  eICU Interventions  STAT CXR Low dose PRN lorazepam     Intervention Category Intermediate Interventions: Respiratory distress - evaluation and management  Merwyn Katos 05/11/2017, 8:47 PM

## 2017-05-11 NOTE — Progress Notes (Signed)
Rt placed pt on his CPAP machine for him to sleep. No distress noted family at bedside.

## 2017-05-11 NOTE — Progress Notes (Signed)
PT Cancellation Note  Patient Details Name: [name] MRN: [id number] DOB: 1965-10-19   Cancelled Treatment:    Reason Eval/Treat Not Completed: Medical issues which prohibited therapy RN reports SOB this morning and pt resting now on CPAP.  RN requests PT to check back another day.   Mickal Meno,KATHrine E 05/11/2017, 11:14 AM Zenovia Jarred, PT, DPT 05/11/2017 Pager: [phone]

## 2017-05-11 NOTE — Procedures (Signed)
Chest Tube Insertion Procedure Note  Indications:  Clinically significant Pneumothorax  Pre-operative Diagnosis: Pneumothorax  Post-operative Diagnosis: Pneumothorax  Procedure Details  Informed consent was obtained for the procedure, including sedation.  Risks of lung perforation, hemorrhage, arrhythmia, and adverse drug reaction were discussed.   After sterile skin prep, using standard technique, a [place] pigtail chest tube was placed in the right 4th rib space, anterior axillary line.  Findings: Gush of air and approx 10cc of serous yellow fluid returned. Chest tube placed to suction with no air-leak present in chamber, good tidaling noted within the chest tube.    Estimated Blood Loss:  Minimal         Specimens:  None              Complications:  Patient received  Ativan IV prior to the procedure as he was anxious. Towards end of procedure patient became unresponsive to all but very hard sternal rub, not obeying commands. Took patient off BIPAP and Ambu bagged him for approx 5 minutes and gave Flumazenil, after which he awoke and was obeying commands and verbalizing. ABG showed no acute hypercapnea (only well compensated hypercapnea). Vital signs remained stable throughout the procedure.          Disposition: ICU         Condition: stable  Attending Attestation: I performed the procedure.

## 2017-05-11 NOTE — Progress Notes (Signed)
Pt in respiratory distress and anxious. Elink called CXR done. Ativan given. Notified by radiology of pneumothorax. Called and notified Dr. Sung Amabile. Currently waiting for physician from [facility] for chest tube placement.

## 2017-05-11 NOTE — Progress Notes (Signed)
Patient yelling help into hallway, RN responding and patient showing signs of respiratory distress. Patient sitting in chair bent over bedside table using accessory muscles. Nebulizer given, patient stated he was anxious and hot with [facility] and chest tightness. Removed patient gown, placed fans and ice pack around patient and MD paged. MD ordered Solumedrol and instructed RN to place patient on CPAP. Once patient cooled and calm SHOB subsided. Patient now asleep in chair.

## 2017-05-11 DEATH — deceased

## 2017-05-12 ENCOUNTER — Ambulatory Visit: Payer: Self-pay | Admitting: Emergency Medicine

## 2017-05-12 LAB — GLUCOSE, CAPILLARY
GLUCOSE-CAPILLARY: 180 mg/dL — AB (ref 65–99)
GLUCOSE-CAPILLARY: 181 mg/dL — AB (ref 65–99)
Glucose-Capillary: 103 mg/dL — ABNORMAL HIGH (ref 65–99)
Glucose-Capillary: 106 mg/dL — ABNORMAL HIGH (ref 65–99)
Glucose-Capillary: 100 mg/dL — ABNORMAL HIGH (ref 65–99)
Glucose-Capillary: 103 mg/dL — ABNORMAL HIGH (ref 65–99)

## 2017-05-12 LAB — BRAIN NATRIURETIC PEPTIDE: B Natriuretic Peptide: 131.8 pg/mL — ABNORMAL HIGH (ref 0.0–100.0)

## 2017-05-12 LAB — TROPONIN I: TROPONIN I: 0.03 ng/mL — AB (ref <0.03)

## 2017-05-12 MED ORDER — FLUMAZENIL 0.5 MG/5ML IV SOLN
INTRAVENOUS | Status: AC
  Administered 2017-05-12: 0.5 mg
  Filled 2017-05-12: qty 5

## 2017-05-12 MED ORDER — ACETAMINOPHEN 10 MG/ML IV SOLN
1000.0000 mg | Freq: Four times a day (QID) | INTRAVENOUS | Status: AC | PRN
  Administered 2017-05-12 (×2): 1000 mg via INTRAVENOUS
  Filled 2017-05-12 (×3): qty 100

## 2017-05-12 MED ORDER — FLUMAZENIL 0.5 MG/5ML IV SOLN
0.5000 mg | Freq: Once | INTRAVENOUS | Status: AC
  Administered 2017-05-12: 0.5 mg via INTRAVENOUS

## 2017-05-12 MED ORDER — FUROSEMIDE 10 MG/ML IJ SOLN
40.0000 mg | Freq: Every day | INTRAMUSCULAR | Status: DC
  Administered 2017-05-12 – 2017-05-16 (×5): 40 mg via INTRAVENOUS
  Filled 2017-05-12 (×5): qty 4

## 2017-05-12 MED ORDER — BOOST / RESOURCE BREEZE PO LIQD CUSTOM
1.0000 | Freq: Two times a day (BID) | ORAL | Status: DC
  Administered 2017-05-12 – 2017-05-17 (×9): 1 via ORAL

## 2017-05-12 MED ORDER — SIMETHICONE 40 MG/0.6ML PO SUSP
40.0000 mg | Freq: Four times a day (QID) | ORAL | Status: AC | PRN
  Administered 2017-05-12 – 2017-05-14 (×3): 40 mg via ORAL
  Filled 2017-05-12 (×4): qty 0.6

## 2017-05-12 MED ORDER — ACETAMINOPHEN-CODEINE #3 300-30 MG PO TABS
1.0000 | ORAL_TABLET | Freq: Four times a day (QID) | ORAL | Status: DC | PRN
  Administered 2017-05-12 – 2017-05-16 (×11): 1 via ORAL
  Filled 2017-05-12 (×12): qty 1

## 2017-05-12 NOTE — Progress Notes (Signed)
eLink Physician-Brief Progress Note Patient Name: [name] DOB: 1965-06-02 MRN: [id number]   Date of Service  05/12/2017  HPI/Events of Note  Still lethargic but moaning as if in pain  eICU Interventions  IV acetaminophen ordered     Intervention Category Intermediate Interventions: Pain - evaluation and management  Merwyn Katos 05/12/2017, 5:15 AM

## 2017-05-12 NOTE — Progress Notes (Signed)
PULMONARY / CRITICAL CARE MEDICINE   Name: [name] MRN: [id number] DOB: 1965-06-25    ADMISSION DATE:  04/25/2017 CONSULTATION DATE:  05/06/2017  REFERRING MD:  Rancour  CHIEF COMPLAINT:  Dyspnea  HISTORY OF PRESENT ILLNESS:    52 yr old male, smoker, chronic cocaine/opiate abuse, with PMH of severe obstructive lung disease, burnt out sarcoid with large bullae, OSA on CPAP, chronic diastolic CHF,recently discharged on 04/21/17 presented on 4/12 s/p cocaine use with dyspnea and chest pain,intubated on mechanical ventilation s/p R chest tube for pneumothorax.Developed subcutaneous emphysema 4/13 when chest tube migrated out of thoracic cavity. Improved when new tube placed. CT to water seal 4/22, with increase in pneumothorax 4/23 imaging.  He decompensated on the night of 4/25 and intubated 4/26. Was extubated 4/27 to BiPAP. Chest tube dc'd 4/29  Followed by RB in clinic  STUDIES:  Ct chest 4/12 >>Thin-walled cavitary lesion/bulla in the anterior right upper lobe measuring 10.7 x 7.3 cm (series 7/image 41). This is mildly increased from prior CT, but was previously more thick-walled. Additional paraseptal emphysematous changes, upper lobe predominant.  CULTURES: No recent  ANTIBIOTICS: Doxycycline  SIGNIFICANT EVENTS: 4/30 acute onset dyspnea & desatn 5/1 recurrent pneumo >> pigtail reinserted , unresponsive after ativan & reversed with flumazenil  LINES/TUBES: ETT 4/26 >> 4/27 Chest tube 4/12 >> 4/29, 5/1 >>   SUBJECTIVE:  On bipap overnight afebrile  VITAL SIGNS: BP (!) 142/99   Pulse (!) 117   Temp 98.9 F (37.2 C) (Axillary)   Resp (!) 32   Ht 5' 7.5" (1.715 m)   Wt 134 lb 14.7 oz (61.2 kg)   SpO2 100%   BMI 20.82 kg/m   VENTILATOR SETTINGS: FiO2 (%):  [35 %-36 %] 36 %  INTAKE / OUTPUT: I/O last 3 completed shifts: In: 540 [P.O.:540] Out: 1125 [Urine:1125]  PHYSICAL EXAMINATION: General:  chronically ill appearing, resting on bipap, FF mask,  taken off to [place] Neuro:  Anxious,Oriented x 3, non focal  HEENT:  [place]/AT PERRL EOMI O-P neg Cardiovascular:  RRR. No m/r/g Lungs:  Mild accessory muscles,Scattered rhonchi with prolonged expiration, decreased rt  , no air leak on chest tube Abdomen:  Supple NT, +BS Musculoskeletal:  No inflamed joints Skin:  No rash  LABS:  BMET Recent Labs  Lab 05/07/17 0328 05/09/17 0341 05/11/17 0319  NA 137 138 139  K 4.2 4.1 4.6  CL 88* 94* 95*  CO2 35* 32 33*  BUN 28* 33* 27*  CREATININE 0.83 0.65 0.64  GLUCOSE 132* 164* 120*    Electrolytes Recent Labs  Lab 05/06/17 0332 05/07/17 0328 05/09/17 0341 05/11/17 0319  CALCIUM 9.7 9.0 8.9 8.7*  MG 1.7 2.0  --   --   PHOS 3.1 2.9  --   --     CBC Recent Labs  Lab 05/07/17 0328 05/09/17 0341 05/11/17 0319  WBC 13.6* 6.9 10.9*  HGB 11.0* 11.1* 10.6*  HCT 36.2* 36.4* 34.9*  PLT 282 307 382    Coag's No results for input(s): APTT, INR in the last 168 hours.  Sepsis Markers No results for input(s): LATICACIDVEN, PROCALCITON, O2SATVEN in the last 168 hours.  ABG Recent Labs  Lab 05/07/17 1925 05/11/17 2210 05/11/17 2320  PHART 7.361 7.348* 7.361  PCO2ART 75.0* 69.9* 68.3*  PO2ART 77.3* 66.4* 438*    Liver Enzymes No results for input(s): AST, ALT, ALKPHOS, BILITOT, ALBUMIN in the last 168 hours.  Cardiac Enzymes Recent Labs  Lab 05/09/17 0341 05/09/17 0902 05/12/17 0251  TROPONINI <0.03 <0.03 0.03*    Glucose Recent Labs  Lab 05/11/17 0753 05/11/17 1606 05/11/17 1959 05/11/17 2356 05/12/17 0310 05/12/17 0726  GLUCAP 138* 196* 103* 155* 103* 106*    Imaging Dg Chest Port 1 View  Result Date: 05/12/2017 CLINICAL DATA:  New chest tube placement EXAM: PORTABLE CHEST 1 VIEW COMPARISON:  05/11/2017 FINDINGS: A pigtail type right chest tube has been placed. Evacuation of most of the right pneumothorax. Possible small residual pneumothorax remaining in the base. Right lung is re-expanded. Persistent linear  scarring or atelectasis in the right mid lung. Scarring and atelectasis in the left mid and lower lung is unchanged. Pleural thickening in the left costophrenic angle. Emphysematous changes in the upper lungs. Heart size is normal. Calcified and tortuous aorta. IMPRESSION: A right chest tube is been placed with significant evacuation of the right pneumothorax and re-expansion of the right lung. Electronically Signed   By: Burman Nieves M.D.   On: 05/12/2017 00:10   Dg Chest Port 1 View  Result Date: 05/11/2017 CLINICAL DATA:  52 year old male with a history of respiratory distress EXAM: PORTABLE CHEST 1 VIEW COMPARISON:  05/10/2017, 05/09/2017, 05/07/2017 FINDINGS: Interval recurrence of a right subpulmonic pneumothorax. Similar appearance of bilateral mixed interstitial and airspace opacities. Surgical changes at the apex of the left lung. Cardiomediastinal silhouette unchanged in size and contour, with the borders partially obscured by overlying lung/pleural disease. IMPRESSION: Interval recurrence of right-sided subpulmonic pneumothorax. Similar appearance of mixed interstitial and airspace opacity of the bilateral lungs. Surgical changes at the apex of the left lung. These results were called by telephone at the time of interpretation on 05/11/2017 at 9:22 pm to the nurse caring for the patient, Ms Su Monks, who verbally acknowledged these results. Electronically Signed   By: Gilmer Mor D.O.   On: 05/11/2017 21:24   -personally reviewed    DISCUSSION: Patient has significant underlying parenchymal lung and airway disease secondary to his sarcoidosis and tobacco hx, with bullous disease leading to his current ptx.  He had recurrent right-sided pneumothorax due to bullous disease, pleurodesis is not performed was times since side-port of chest tube was outside chest wall but will need pleurodesis.   His prolonged admission is also been due to acute on chronic hypercarbic respiratory  failure with recurrent episodes of dyspnea and desaturation    ASSESSMENT / PLAN:   Recurrent Rt pneumothorax underlying sarcoidosis/bullous emphysema  - Will need pleurodesis. -TCTS consult to see if he is a candidate for resection -Continue chest tube to suction    Acute on chr  Respiratory failure secondary to sarcoid, bullous emphysema  P:   BiPAP prn only Ct SoluMedrol 40 q 12 Titrate nasal O2 to keep sat 90-92 complete doxy x 7ds Avoid sedating medications such as benzodiazepines  Elevated trop SVT  P:  Cardizem to oral 60 q 8 - SR in 24h  Substance abuse, cocaine-counseling  Remain on SD status  Cyril Mourning MD. FCCP. [place] [facility] Pager [phone] If no response call [phone]    05/12/2017, 9:53 AM

## 2017-05-12 NOTE — Progress Notes (Signed)
eLink Physician-Brief Progress Note Patient Name: [name] DOB: 07/02/1965 MRN: [id number]   Date of Service  05/12/2017  HPI/Events of Note  Indigestion, was previously on Simethicone with benefit but it dropped off the [facility].  eICU Interventions  Simethicone 40 mg po Q 6 hrs prn indigestion.        Thomasene Lot [name] 05/12/2017, 8:37 PM

## 2017-05-12 NOTE — Progress Notes (Signed)
05/12/17  Notified Dr Vassie Loll in regards to patient wanting to talk about possible lung transplant. Also notified MD of Pt's BP 140's/105, Will continue to monitor.

## 2017-05-12 NOTE — Progress Notes (Addendum)
ABG drawn by DR. Tosh Glaze, LEFT Femoral due to emergent situation.  Addendum to RT note: It was RIGHT femoral not left

## 2017-05-12 NOTE — Plan of Care (Signed)
  Problem: Safety: Goal: Ability to remain free from injury will improve Outcome: Progressing   Problem: Skin Integrity: Goal: Risk for impaired skin integrity will decrease Outcome: Progressing   Problem: Coping: Goal: Level of anxiety will decrease Outcome: Progressing   

## 2017-05-13 ENCOUNTER — Inpatient Hospital Stay (HOSPITAL_COMMUNITY): Payer: 59

## 2017-05-13 LAB — PHOSPHORUS: PHOSPHORUS: 2.7 mg/dL (ref 2.5–4.6)

## 2017-05-13 LAB — CBC
HCT: 39.5 % (ref 39.0–52.0)
HEMOGLOBIN: 12.2 g/dL — AB (ref 13.0–17.0)
MCH: 25.6 pg — AB (ref 26.0–34.0)
MCHC: 30.9 g/dL (ref 30.0–36.0)
MCV: 82.8 fL (ref 78.0–100.0)
PLATELETS: 406 10*3/uL — AB (ref 150–400)
RBC: 4.77 MIL/uL (ref 4.22–5.81)
RDW: 13.9 % (ref 11.5–15.5)
WBC: 16.7 10*3/uL — ABNORMAL HIGH (ref 4.0–10.5)

## 2017-05-13 LAB — GLUCOSE, CAPILLARY
GLUCOSE-CAPILLARY: 82 mg/dL (ref 65–99)
Glucose-Capillary: 114 mg/dL — ABNORMAL HIGH (ref 65–99)
Glucose-Capillary: 85 mg/dL (ref 65–99)
Glucose-Capillary: 132 mg/dL — ABNORMAL HIGH (ref 65–99)

## 2017-05-13 LAB — BASIC METABOLIC PANEL
Anion gap: 8 (ref 5–15)
BUN: 31 mg/dL — AB (ref 6–20)
CHLORIDE: 93 mmol/L — AB (ref 101–111)
CO2: 34 mmol/L — ABNORMAL HIGH (ref 22–32)
Calcium: 8.5 mg/dL — ABNORMAL LOW (ref 8.9–10.3)
Creatinine, Ser: 0.72 mg/dL (ref 0.61–1.24)
Glucose, Bld: 103 mg/dL — ABNORMAL HIGH (ref 65–99)
Potassium: 4.7 mmol/L (ref 3.5–5.1)
SODIUM: 135 mmol/L (ref 135–145)

## 2017-05-13 LAB — MAGNESIUM: MAGNESIUM: 1.9 mg/dL (ref 1.7–2.4)

## 2017-05-13 MED ORDER — LIDOCAINE HCL 1 % IJ SOLN
10.0000 mL | Freq: Once | INTRAMUSCULAR | Status: AC
  Administered 2017-05-14: 10 mL via INTRAPLEURAL
  Filled 2017-05-13: qty 20

## 2017-05-13 MED ORDER — TALC (STERITALC) POWDER FOR INTRAPLEURAL USE
4.0000 g | Freq: Once | INTRAVENOUS | Status: DC
  Filled 2017-05-13: qty 4

## 2017-05-13 MED ORDER — KETOROLAC TROMETHAMINE 15 MG/ML IJ SOLN
7.5000 mg | Freq: Four times a day (QID) | INTRAMUSCULAR | Status: DC | PRN
  Administered 2017-05-13 – 2017-05-17 (×15): 7.5 mg via INTRAVENOUS
  Filled 2017-05-13 (×17): qty 1

## 2017-05-13 NOTE — Progress Notes (Signed)
eLink Physician-Brief Progress Note Patient Name: Lukasz Rogus DOB: Dec 06, 1965 MRN: [id number]   Date of Service  05/13/2017  HPI/Events of Note  Post-chest tube placement site pain inadequately controlled by iv Tylenol and Tylenol # 3.  eICU Interventions  Toradol 7.5 mg iv Q 6 Hrs prn pain unrelieved by Tylenol        Shekia Kuper U Pharrah Rottman 05/13/2017, 1:21 AM

## 2017-05-13 NOTE — Progress Notes (Signed)
Physical Therapy Treatment Patient Details Name: [name] MRN: [id number] DOB: 1965/03/27 Today's Date: 05/13/2017    History of Present Illness 52 yo male admitted with acute respiratory failure, R pneumothorax. Hx of drug abuse, OSA, CHF, sarcoidosis, COPD-O2 dep 4L at baseline. CT pulled 05/09/17.    PT Comments    Pt OOB in recliner on 4 lts sats 98%, HR 98 Assisted out of recliner to amb a limited distance.  General Gait Details: 2 standing rest breaks. dyspnea 4/4. O2 sats on 4 lts avg 99%    distance limited by pain and dizziness      HR increased to 135  Follow Up Recommendations  Home health PT;Supervision - Intermittent     Equipment Recommendations  None recommended by PT    Recommendations for Other Services       Precautions / Restrictions Precautions Precautions: Fall Precaution Comments: monitor vitals, chest tube Restrictions Weight Bearing Restrictions: No    Mobility  Bed Mobility               General bed mobility comments: in recliner  Transfers Overall transfer level: Needs assistance   Transfers: Sit to/from Stand Sit to Stand: Min assist;+2 safety/equipment         General transfer comment: 50% VC's on proper hand placement and safety  Ambulation/Gait Ambulation/Gait assistance: Min assist;+2 safety/equipment Ambulation Distance (Feet): 10 Feet Assistive device: Rolling walker (2 wheeled) Gait Pattern/deviations: Step-through pattern Gait velocity: decreased    General Gait Details: 2 standing rest breaks. dyspnea 4/4. O2 sats on 4 lts avg 99%    distance limited by pain and dizziness      HR increased to 135   Stairs             Wheelchair Mobility    Modified Rankin (Stroke Patients Only)       Balance                                            Cognition Arousal/Alertness: Awake/alert Behavior During Therapy: WFL for tasks assessed/performed Overall Cognitive Status: Within Functional  Limits for tasks assessed                                 General Comments: required positve encouragement and direction to stay on task              Exercises      General Comments        Pertinent Vitals/Pain Pain Assessment: Faces Faces Pain Scale: Hurts even more Pain Location: pain with coughing and chest tube site Pain Descriptors / Indicators: Sharp;Discomfort;Grimacing Pain Intervention(s): Monitored during session;Repositioned    Home Living                      Prior Function            PT Goals (current goals can now be found in the care plan section) Progress towards PT goals: Progressing toward goals    Frequency    Min 3X/week      PT Plan Current plan remains appropriate    Co-evaluation              AM-PAC PT "6 Clicks" Daily Activity  Outcome Measure  Difficulty turning over in bed (including adjusting bedclothes, sheets and  blankets)?: A Lot Difficulty moving from lying on back to sitting on the side of the bed? : A Lot Difficulty sitting down on and standing up from a chair with arms (e.g., wheelchair, bedside commode, etc,.)?: A Lot Help needed moving to and from a bed to chair (including a wheelchair)?: A Lot Help needed walking in hospital room?: A Lot Help needed climbing 3-5 steps with a railing? : A Lot 6 Click Score: 12    End of Session Equipment Utilized During Treatment: Oxygen Activity Tolerance: Treatment limited secondary to medical complications (Comment) Patient left: in chair;with call bell/phone within reach;with family/visitor present Nurse Communication: Mobility status PT Visit Diagnosis: Difficulty in walking, not elsewhere classified (R26.2);Other abnormalities of gait and mobility (R26.89)     Time: 1420-1450 PT Time Calculation (min) (ACUTE ONLY): 30 min  Charges:  $Gait Training: 8-22 mins $Therapeutic Activity: 8-22 mins                    G Codes:       Felecia Shelling   PTA [facility]  Acute  Rehab Pager      [phone]

## 2017-05-13 NOTE — Progress Notes (Signed)
   05/13/17 1400  Clinical Encounter Type  Visited With Patient and family together  Visit Type Initial;Psychological support;Spiritual support;Critical Care  Referral From Nurse  Consult/Referral To Chaplain  Spiritual Encounters  Spiritual Needs Other (Comment) (Spiritual Care Conversation/Support)  Stress Factors  Patient Stress Factors Health changes;Major life changes  Family Stress Factors Health changes;Major life changes  Advance Directives (For Healthcare)  Does Patient Have a Medical Advance Directive? No  Would patient like information on creating a medical advance directive? No - Patient declined   I visited with the patient and his spouse per Spiritual Care Consult to discuss an Proofreader, Healthcare Power of [place].  After discussing the document, the patient does not want to complete an Advance Directive. Patient wants his wife to make his decisions if he is unable to make his own healthcare decisions and that is protected by law.  The patient's wife was also wanting to know if Chaplains could help with "getting him a new lung." I informed she and the patient that Spiritual Care does not handle this, and recommended that they speak with the patient's care team.   Please, contact Spiritual Care for further assistance.   Chaplain Clint Bolder M.Div., [facility]

## 2017-05-13 NOTE — Progress Notes (Signed)
PULMONARY / CRITICAL CARE MEDICINE   Name: [name] MRN: [id number] DOB: [birth year]    ADMISSION DATE:  05-17-2017 CONSULTATION DATE:  05-17-17  REFERRING MD:  Rancour  CHIEF COMPLAINT:  Dyspnea  HISTORY OF PRESENT ILLNESS:    52 yr old male, smoker, chronic cocaine/opiate abuse, with PMH of severe obstructive lung disease, burnt out sarcoid with large bullae, OSA on CPAP, chronic diastolic CHF,recently discharged on 04/21/17 presented on 4/12 s/p cocaine use with dyspnea and chest pain,intubated on mechanical ventilation s/p R chest tube for pneumothorax.Developed subcutaneous emphysema 4/13 when chest tube migrated out of thoracic cavity. Improved when new tube placed. CT to water seal 4/22, with increase in pneumothorax 4/23 imaging.  He decompensated on the night of 4/25 and intubated 4/26. Was extubated 4/27 to BiPAP. Chest tube dc'd 4/29  Followed by RB in clinic  STUDIES:  Ct chest 4/12 >>Thin-walled cavitary lesion/bulla in the anterior right upper lobe measuring 10.7 x 7.3 cm (series 7/image 41). This is mildly increased from prior CT, but was previously more thick-walled. Additional paraseptal emphysematous changes, upper lobe predominant.  CULTURES: No recent  ANTIBIOTICS: Doxycycline completed 7d  SIGNIFICANT EVENTS: 4/30 acute onset dyspnea & desatn 5/1 recurrent pneumo >> pigtail reinserted , unresponsive after ativan & reversed with flumazenil  LINES/TUBES: ETT 4/26 >> 4/27 Chest tube 4/12 >> 4/29, 5/1 >>   SUBJECTIVE:  Complains of right-sided chest pain No air leak noted on chest tube  VITAL SIGNS: Blood Pressure 133/87   Pulse (Abnormal) 117   Temperature (Abnormal) 97.1 F (36.2 C) (Axillary)   Respiration 12   Height 5' 7.5" (1.715 m)   Weight 121 lb 0.5 oz (54.9 kg)   Oxygen Saturation 94%   Body Mass Index 18.68 kg/m   VENTILATOR SETTINGS:    INTAKE / OUTPUT: I/O last 3 completed shifts: In: 580 [P.O.:480; IV Piggyback:100] Out:  2500 [Urine:2375; Chest Tube:125]  PHYSICAL EXAMINATION: Chronically ill-appearing 52 year old male resting comfortably currently in bed he is in no acute distress HEENT: Normocephalic atraumatic he has temporal wasting mucous membranes are moist there is no jugular venous distention Pulmonary: Right chest tube in place.  No air leak noted on suction scattered rhonchi Cardiac: Regular rate and rhythm Extremities: Brisk cap refill no edema warm nontender Abdomen: Soft nontender Neuro: Awake oriented no focal deficits LABS:  BMET Recent Labs  Lab 05/09/17 0341 05/11/17 0319 05/13/17 0740  NA 138 139 135  K 4.1 4.6 4.7  CL 94* 95* 93*  CO2 32 33* 34*  BUN 33* 27* 31*  CREATININE 0.65 0.64 0.72  GLUCOSE 164* 120* 103*    Electrolytes Recent Labs  Lab 05/07/17 0328 05/09/17 0341 05/11/17 0319 05/13/17 0740  CALCIUM 9.0 8.9 8.7* 8.5*  MG 2.0  --   --  1.9  PHOS 2.9  --   --  2.7    CBC Recent Labs  Lab 05/09/17 0341 05/11/17 0319 05/13/17 0740  WBC 6.9 10.9* 16.7*  HGB 11.1* 10.6* 12.2*  HCT 36.4* 34.9* 39.5  PLT 307 382 406*    Coag's No results for input(s): APTT, INR in the last 168 hours.  Sepsis Markers No results for input(s): LATICACIDVEN, PROCALCITON, O2SATVEN in the last 168 hours.  ABG Recent Labs  Lab 05/07/17 1925 05/11/17 2210 05/11/17 2320  PHART 7.361 7.348* 7.361  PCO2ART 75.0* 69.9* 68.3*  PO2ART 77.3* 66.4* 438*    Liver Enzymes No results for input(s): AST, ALT, ALKPHOS, BILITOT, ALBUMIN in the last 168 hours.  Cardiac Enzymes Recent Labs  Lab 05/09/17 0341 05/09/17 0902 05/12/17 0251  TROPONINI <0.03 <0.03 0.03*    Glucose Recent Labs  Lab 05/12/17 0726 05/12/17 1214 05/12/17 1529 05/12/17 2059 05/12/17 2302 05/13/17 0517  GLUCAP 106* 180* 181* 100* 103* 114*    Imaging Dg Chest Port 1 View  Result Date: 05/13/2017 CLINICAL DATA:  Acute respiratory failure. EXAM: PORTABLE CHEST 1 VIEW COMPARISON:  Radiograph  of May 11, 2017. FINDINGS: The heart size and mediastinal contours are within normal limits. Right-sided chest tube is unchanged in position. No definite pneumothorax is noted. Postoperative changes are noted in the left upper lobe. Stable emphysematous disease is noted in both upper lobes, with large bulla formation in the right upper lobe. Stable bilateral midlung opacities are noted most consistent with scarring or fibrosis. Scarring is noted in both lung bases is well. The visualized skeletal structures are unremarkable. IMPRESSION: Stable position of right-sided chest tube without definite pneumothorax. Stable bilateral emphysematous disease and scarring is noted. Electronically Signed   By: Lupita Raider, M.D.   On: 05/13/2017 07:40   -personally reviewed    DISCUSSION: Patient has significant underlying parenchymal lung and airway disease secondary to his sarcoidosis and tobacco hx, with bullous disease leading to his current ptx.  He had recurrent right-sided pneumothorax due to bullous disease, pleurodesis is not performed was times since side-port of chest tube was outside chest wall but will need pleurodesis.   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  Right-sided chest tube in place.  Chronic left scarring.  No pneumothorax. Spoke to thoracic surgery on 5/2 not a surgical candidate. Plan We will change chest tube to waterseal Follow-up chest x-ray in a.m., if no new pneumothorax or air leak we will pleurodesis this weekend   Acute on chr  Respiratory failure secondary to sarcoid, bullous emphysema  -He has completed 7 days of doxycycline Plan PRN BiPAP Continuing Solu-Medrol at 40 mg every 12 hours Wean oxygen for saturations greater  than 90% Mobilize  Elevated trop SVT Plan Continuing sustained release Cardizem  Substance abuse Plan Daily substance abuse counseling.  He will need to be free of cocaine for at least 6 months if not a year if he hopes to have her get back on transplant list  Simonne Martinet ACNP-BC [facility] Pulmonary/Critical Care Pager # [phone] OR # [phone] if no answer   05/13/2017, 9:24 AM

## 2017-05-13 NOTE — Care Management Note (Signed)
Case Management Note  Patient Details  Name: [name] MRN: [id number] Date of Birth: 1965-05-13  Subjective/Objective:                  CT reinserted overnight   Action/Plan: Date: May 13, 2017 Marcelle Smiling, BSN, [place], [place] [phone] Chart and notes review for patient progress and needs. Will follow for case management and discharge needs. No cm or discharge needs present at time of this review. Next review date: [id number]  Expected Discharge Date:  (unknown)               Expected Discharge Plan:     In-House Referral:     Discharge planning Services     Post Acute Care Choice:    Choice offered to:     DME Arranged:    DME Agency:     HH Arranged:    HH Agency:     Status of Service:     If discussed at [employer], dates discussed:    Additional Comments:  Golda Acre, RN 05/13/2017, 8:33 AM

## 2017-05-14 ENCOUNTER — Inpatient Hospital Stay (HOSPITAL_COMMUNITY): Payer: 59

## 2017-05-14 LAB — GLUCOSE, CAPILLARY
GLUCOSE-CAPILLARY: 100 mg/dL — AB (ref 65–99)
GLUCOSE-CAPILLARY: 120 mg/dL — AB (ref 65–99)
GLUCOSE-CAPILLARY: 191 mg/dL — AB (ref 65–99)
GLUCOSE-CAPILLARY: 56 mg/dL — AB (ref 65–99)
Glucose-Capillary: 117 mg/dL — ABNORMAL HIGH (ref 65–99)
Glucose-Capillary: 97 mg/dL (ref 65–99)
Glucose-Capillary: 263 mg/dL — ABNORMAL HIGH (ref 65–99)

## 2017-05-14 MED ORDER — TALC (STERITALC) POWDER FOR INTRAPLEURAL USE
4.0000 g | Freq: Once | INTRAVENOUS | Status: AC
  Administered 2017-05-14: 4 g via INTRAPLEURAL
  Filled 2017-05-14: qty 4

## 2017-05-14 MED ORDER — LIDOCAINE HCL 1 % IJ SOLN
10.0000 mL | Freq: Once | INTRAMUSCULAR | Status: AC
  Filled 2017-05-14: qty 20

## 2017-05-14 MED ORDER — MORPHINE SULFATE (PF) 4 MG/ML IV SOLN
4.0000 mg | Freq: Once | INTRAVENOUS | Status: AC
  Administered 2017-05-14: 4 mg via INTRAVENOUS
  Filled 2017-05-14: qty 1

## 2017-05-14 MED ORDER — INSULIN ASPART 100 UNIT/ML ~~LOC~~ SOLN
2.0000 [IU] | SUBCUTANEOUS | Status: DC
  Administered 2017-05-14: 4 [IU] via SUBCUTANEOUS
  Administered 2017-05-15 – 2017-05-17 (×6): 2 [IU] via SUBCUTANEOUS
  Administered 2017-05-17: 4 [IU] via SUBCUTANEOUS

## 2017-05-14 NOTE — Progress Notes (Signed)
PULMONARY / CRITICAL CARE MEDICINE   Name: [name] MRN: [id number] DOB: Aug 13, 1965    ADMISSION DATE:  04/30/2017 CONSULTATION DATE:  April 30, 2017  REFERRING MD:  Rancour  CHIEF COMPLAINT:  Dyspnea  HISTORY OF PRESENT ILLNESS:    52 yr old male, smoker, chronic cocaine/opiate abuse, with PMH of severe obstructive lung disease, burnt out sarcoid with large bullae, OSA on CPAP, chronic diastolic CHF,recently discharged on 04/21/17 presented on 4/12 s/p cocaine use with dyspnea and chest pain,intubated on mechanical ventilation s/p R chest tube for pneumothorax.Developed subcutaneous emphysema 4/13 when chest tube migrated out of thoracic cavity. Improved when new tube placed. CT to water seal 4/22, with increase in pneumothorax 4/23 imaging.  He decompensated on the night of 4/25 and intubated 4/26. Was extubated 4/27 to BiPAP.  Patient had chest tube inserted 2019  Talc Pleurodesis 05/14/2016  Followed by RB in clinic  STUDIES:  Ct chest 4/12 >>Thin-walled cavitary lesion/bulla in the anterior right upper lobe measuring 10.7 x 7.3 cm (series 7/image 41). This is mildly increased from prior CT, but was previously more thick-walled. Additional paraseptal emphysematous changes, upper lobe predominant.  CULTURES: No recent  ANTIBIOTICS: Doxycycline completed 7d  SIGNIFICANT EVENTS: 4/30 acute onset dyspnea & desatn 5/1 recurrent pneumo >> pigtail reinserted , unresponsive after ativan & reversed with flumazenil  LINES/TUBES: ETT 4/26 >> 4/27 Chest tube 4/12 >> 4/29, 5/1 >>   SUBJECTIVE:  Complains of right-sided chest pain No air leak noted on chest tube  VITAL SIGNS: BP 104/85   Pulse (!) 117   Temp 98.7 F (37.1 C) (Oral)   Resp 18   Ht 5' 7.5" (1.715 m)   Wt 127 lb 6.8 oz (57.8 kg)   SpO2 100%   BMI 19.66 kg/m    INTAKE / OUTPUT: I/O last 3 completed shifts: In: -  Out: 3100 [Urine:3050; Chest Tube:50]  PHYSICAL EXAMINATION: Chronically ill-appearing  52 year old male resting comfortably currently in bed he is in no acute distress HEENT: Normocephalic atraumatic he has temporal wasting mucous membranes are moist there is no jugular venous distention Pulmonary: Right chest tube in place.  No air leak noted on suction scattered rhonchi Cardiac: Regular rate and rhythm Extremities: Brisk cap refill no edema warm nontender Abdomen: Soft nontender Neuro: Awake oriented no focal deficits LABS:  BMET Recent Labs  Lab 05/09/17 0341 05/11/17 0319 05/13/17 0740  NA 138 139 135  K 4.1 4.6 4.7  CL 94* 95* 93*  CO2 32 33* 34*  BUN 33* 27* 31*  CREATININE 0.65 0.64 0.72  GLUCOSE 164* 120* 103*    Electrolytes Recent Labs  Lab 05/09/17 0341 05/11/17 0319 05/13/17 0740  CALCIUM 8.9 8.7* 8.5*  MG  --   --  1.9  PHOS  --   --  2.7    CBC Recent Labs  Lab 05/09/17 0341 05/11/17 0319 05/13/17 0740  WBC 6.9 10.9* 16.7*  HGB 11.1* 10.6* 12.2*  HCT 36.4* 34.9* 39.5  PLT 307 382 406*    Coag's No results for input(s): APTT, INR in the last 168 hours.  Sepsis Markers No results for input(s): LATICACIDVEN, PROCALCITON, O2SATVEN in the last 168 hours.  ABG Recent Labs  Lab 05/11/17 2210 05/11/17 2320  PHART 7.348* 7.361  PCO2ART 69.9* 68.3*  PO2ART 66.4* 438*    Liver Enzymes No results for input(s): AST, ALT, ALKPHOS, BILITOT, ALBUMIN in the last 168 hours.  Cardiac Enzymes Recent Labs  Lab 05/09/17 0341 05/09/17 0902 05/12/17 0251  TROPONINI <  0.03 <0.03 0.03*    Glucose Recent Labs  Lab 05/13/17 2330 05/14/17 0355 05/14/17 0752 05/14/17 1138 05/14/17 1555 05/14/17 1705  GLUCAP 100* 117* 97 120* 263* 191*    Imaging Dg Chest Port 1 View  Result Date: 05/14/2017 CLINICAL DATA:  Pneumothorax. EXAM: PORTABLE CHEST 1 VIEW COMPARISON:  Chest x-ray from yesterday. FINDINGS: Unchanged right pigtail chest tube. Stable cardiomediastinal silhouette. Normal pulmonary vascularity. Postsurgical changes again noted  in the left upper lobe. Bullous emphysematous changes in the bilateral upper lobes are similar to prior study. Perihilar and left basilar fibrosis/scarring, grossly unchanged. Chronic blunting of the left costophrenic angle again noted. No definite pneumothorax. No acute osseous abnormality. IMPRESSION: 1. Stable right pigtail chest tube.  No definite pneumothorax. 2. Severe COPD with stable scarring/fibrosis. Electronically Signed   By: Obie Dredge M.D.   On: 05/14/2017 08:35   -personally reviewed    DISCUSSION: Patient has significant underlying parenchymal lung and airway disease secondary to his sarcoidosis and tobacco hx, with bullous disease leading to his current ptx.  He had recurrent right-sided pneumothorax due to bullous disease, pleurodesis is not performed was times since side-port of chest tube was outside chest wall but will need pleurodesis.   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  Right-sided chest tube in place.  Chronic left scarring.  No pneumothorax. Spoke to thoracic surgery on 5/2 not a surgical candidate. Plan We will change chest tube to waterseal Chest x-ray a.m. does not show pneumothorax.  No air leak seen.  Talc pleurodesis done.  Patient tolerated well.  Follow-up chest x-ray.   Acute on chr  Respiratory failure secondary to sarcoid, bullous emphysema  -He has completed 7 days of doxycycline Plan PRN BiPAP Continuing Solu-Medrol at 40 mg every 12 hours Wean oxygen for saturations greater than 90% Mobilize  Elevated trop SVT Plan Continuing sustained release Cardizem  Substance abuse Plan Daily substance abuse counseling.  He will need to be free of cocaine for at least 6 months if not a year if he hopes  to have her get back on transplant list  [facility] Pulmonary Critical Care Pager: [phone]    05/14/2017, 7:35 PM

## 2017-05-15 ENCOUNTER — Inpatient Hospital Stay (HOSPITAL_COMMUNITY): Payer: 59

## 2017-05-15 LAB — GLUCOSE, CAPILLARY
GLUCOSE-CAPILLARY: 150 mg/dL — AB (ref 65–99)
GLUCOSE-CAPILLARY: 93 mg/dL (ref 65–99)
Glucose-Capillary: 115 mg/dL — ABNORMAL HIGH (ref 65–99)
Glucose-Capillary: 114 mg/dL — ABNORMAL HIGH (ref 65–99)
Glucose-Capillary: 122 mg/dL — ABNORMAL HIGH (ref 65–99)
Glucose-Capillary: 132 mg/dL — ABNORMAL HIGH (ref 65–99)
Glucose-Capillary: 123 mg/dL — ABNORMAL HIGH (ref 65–99)

## 2017-05-15 MED ORDER — ALUM & MAG HYDROXIDE-SIMETH 200-200-20 MG/5ML PO SUSP
30.0000 mL | Freq: Four times a day (QID) | ORAL | Status: DC | PRN
  Administered 2017-05-15 – 2017-05-17 (×4): 30 mL via ORAL
  Filled 2017-05-15 (×4): qty 30

## 2017-05-15 MED ORDER — ALUM & MAG HYDROXIDE-SIMETH 200-200-20 MG/5 ML NICU TOPICAL: 1.0000 "application " | TOPICAL | Status: DC | PRN

## 2017-05-15 NOTE — Progress Notes (Signed)
PULMONARY / CRITICAL CARE MEDICINE   Name: [name] MRN: [id number] DOB: February 11, 1965    ADMISSION DATE:  04/20/2017 CONSULTATION DATE:  04/24/2017  REFERRING MD:  Rancour  CHIEF COMPLAINT:  Dyspnea  HISTORY OF PRESENT ILLNESS:    52 yr old male, smoker, chronic cocaine/opiate abuse, with PMH of severe obstructive lung disease, burnt out sarcoid with large bullae, OSA on CPAP, chronic diastolic CHF,recently discharged on 04/21/17 presented on 4/12 s/p cocaine use with dyspnea and chest pain,intubated on mechanical ventilation s/p R chest tube for pneumothorax.Developed subcutaneous emphysema 4/13 when chest tube migrated out of thoracic cavity. Improved when new tube placed. CT to water seal 4/22, with increase in pneumothorax 4/23 imaging.  He decompensated on the night of 4/25 and intubated 4/26. Was extubated 4/27 to BiPAP.  Patient had chest tube inserted 2019  Talc Pleurodesis 05/14/2016  Followed by RB in clinic  STUDIES:  Ct chest 4/12 >>Thin-walled cavitary lesion/bulla in the anterior right upper lobe measuring 10.7 x 7.3 cm (series 7/image 41). This is mildly increased from prior CT, but was previously more thick-walled. Additional paraseptal emphysematous changes, upper lobe predominant.  CULTURES: No recent  ANTIBIOTICS: Doxycycline completed 7d  SIGNIFICANT EVENTS: 4/30 acute onset dyspnea & desatn 5/1 recurrent pneumo >> pigtail reinserted , unresponsive after ativan & reversed with flumazenil  LINES/TUBES: ETT 4/26 >> 4/27 Chest tube 4/12 >> 4/29, 5/1 >>   SUBJECTIVE:  Complains of right-sided chest pain No air leak noted on chest tube  VITAL SIGNS: BP (!) 136/105   Pulse (!) 117   Temp 98.6 F (37 C) (Oral)   Resp (!) 24   Ht 5' 7.5" (1.715 m)   Wt 115 lb 4.8 oz (52.3 kg)   SpO2 100%   BMI 17.79 kg/m    INTAKE / OUTPUT: I/O last 3 completed shifts: In: 960 [P.O.:960] Out: 2955 [Urine:2925; Chest Tube:30]  PHYSICAL  EXAMINATION: Chronically ill-appearing 52 year old male resting comfortably currently in bed he is in no acute distress HEENT: Normocephalic atraumatic he has temporal wasting mucous membranes are moist there is no jugular venous distention Pulmonary: Right chest tube in place.  No air leak noted on suction scattered rhonchi Cardiac: Regular rate and rhythm Extremities: Brisk cap refill no edema warm nontender Abdomen: Soft nontender Neuro: Awake oriented no focal deficits LABS:  BMET Recent Labs  Lab 05/09/17 0341 05/11/17 0319 05/13/17 0740  NA 138 139 135  K 4.1 4.6 4.7  CL 94* 95* 93*  CO2 32 33* 34*  BUN 33* 27* 31*  CREATININE 0.65 0.64 0.72  GLUCOSE 164* 120* 103*    Electrolytes Recent Labs  Lab 05/09/17 0341 05/11/17 0319 05/13/17 0740  CALCIUM 8.9 8.7* 8.5*  MG  --   --  1.9  PHOS  --   --  2.7    CBC Recent Labs  Lab 05/09/17 0341 05/11/17 0319 05/13/17 0740  WBC 6.9 10.9* 16.7*  HGB 11.1* 10.6* 12.2*  HCT 36.4* 34.9* 39.5  PLT 307 382 406*    Coag's No results for input(s): APTT, INR in the last 168 hours.  Sepsis Markers No results for input(s): LATICACIDVEN, PROCALCITON, O2SATVEN in the last 168 hours.  ABG Recent Labs  Lab 05/11/17 2210 05/11/17 2320  PHART 7.348* 7.361  PCO2ART 69.9* 68.3*  PO2ART 66.4* 438*    Liver Enzymes No results for input(s): AST, ALT, ALKPHOS, BILITOT, ALBUMIN in the last 168 hours.  Cardiac Enzymes Recent Labs  Lab 05/09/17 0341 05/09/17 0902 05/12/17 0251  TROPONINI <0.03 <0.03 0.03*    Glucose Recent Labs  Lab 05/14/17 1705 05/14/17 1957 05/15/17 0058 05/15/17 0358 05/15/17 0752 05/15/17 1105  GLUCAP 191* 56* 115* 114* 122* 132*    Imaging Dg Chest 1 View  Result Date: 05/15/2017 CLINICAL DATA:  52 year old male with history of shortness of breath. Follow-up evaluation for pneumothorax. EXAM: CHEST  1 VIEW COMPARISON:  Chest x-ray 05/14/2017. FINDINGS: Previously noted right-sided  small bore chest tube remains in position with pigtail projecting over the lower right hemithorax. There continues to be a small to moderate right pneumothorax, estimated to occupy 10-15% of the volume of the right hemithorax most evident in the lower right hemithorax, slightly increased compared to yesterday's examination. Diffuse mass-like areas of architectural distortion and reticular nodularity is again noted throughout the lungs bilaterally, similar to numerous prior examinations, related to underlying sarcoidosis. Emphysematous changes. Chronic elevation of the left hemidiaphragm. Heart size is normal. Aortic atherosclerosis. IMPRESSION: 1. Slight increased size of small to moderate right pneumothorax with right-sided chest tube stable in position, as above. 2. 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 3. Aortic atherosclerosis. Electronically Signed   By: Trudie Reed M.D.   On: 05/15/2017 10:13   Dg Chest Port 1 View  Result Date: 05/14/2017 CLINICAL DATA:  Chest tube placement pleurodesis EXAM: PORTABLE CHEST 1 VIEW COMPARISON:  05/14/17 FINDINGS: Normal cardiac silhouette. RIGHT chest not changed in position with tip projecting over the RIGHT lower lobe. Small subpleural pneumothorax now evident along the RIGHT costophrenic angle. Severe chronic interstitial scarring and bullous change in the upper lobes. IMPRESSION: 1. Small subpulmonic pneumothorax along the RIGHT cardiophrenic angle. RIGHT chest tube in similar position. 2. Chronic interstitial lung disease again noted. Electronically Signed   By: Genevive Bi M.D.   On: 05/14/2017 21:42   -personally reviewed    DISCUSSION: Patient has significant underlying parenchymal lung and airway disease secondary to his sarcoidosis and tobacco hx, with bullous disease leading to his current ptx.  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  Repeat chest x-ray shows small pneumothorax.  Patient currently asymptomatic.  Maintain chest tube suction.   Acute on chr  Respiratory failure secondary to sarcoid, bullous emphysema  -He has completed 7 days of doxycycline Plan PRN BiPAP Continuing Solu-Medrol at 40 mg every 12 hours Wean oxygen for saturations greater than 90% Mobilize  Elevated trop SVT Plan Continuing sustained release Cardizem  Substance abuse Plan Daily substance abuse counseling.  He will need to be free of cocaine for at least 6 months if not a year if he hopes to have her get back on transplant list  [name] Pulmonary Critical Care Pager: [phone]    05/15/2017, 5:04 PM

## 2017-05-15 NOTE — Progress Notes (Signed)
eLink Physician-Brief Progress Note Patient Name: [name] DOB: 01-25-1965 MRN: [id number]   Date of Service  05/15/2017  HPI/Events of Note  Patient c/o indigestion.   eICU Interventions  Will order: 1. Mylanta 30 mL PO Q 6 hours PRN heartburn or indigestion.      Intervention Category Major Interventions: Other:  [name] 05/15/2017, 8:35 PM

## 2017-05-16 LAB — GLUCOSE, CAPILLARY
GLUCOSE-CAPILLARY: 140 mg/dL — AB (ref 65–99)
Glucose-Capillary: 148 mg/dL — ABNORMAL HIGH (ref 65–99)
Glucose-Capillary: 73 mg/dL (ref 65–99)
Glucose-Capillary: 85 mg/dL (ref 65–99)

## 2017-05-16 MED ORDER — OXYCODONE HCL 5 MG PO TABS
5.0000 mg | ORAL_TABLET | Freq: Four times a day (QID) | ORAL | Status: DC | PRN
  Administered 2017-05-16 – 2017-05-17 (×4): 5 mg via ORAL
  Filled 2017-05-16 (×4): qty 1

## 2017-05-16 MED ORDER — SODIUM CHLORIDE 0.9% FLUSH
10.0000 mL | Freq: Two times a day (BID) | INTRAVENOUS | Status: DC
  Administered 2017-05-17: 10 mL

## 2017-05-16 MED ORDER — FUROSEMIDE 40 MG PO TABS
40.0000 mg | ORAL_TABLET | Freq: Every day | ORAL | Status: DC
  Administered 2017-05-16 – 2017-05-17 (×2): 40 mg via ORAL
  Filled 2017-05-16 (×2): qty 1

## 2017-05-16 MED ORDER — METHYLPREDNISOLONE SODIUM SUCC 40 MG IJ SOLR
30.0000 mg | Freq: Two times a day (BID) | INTRAMUSCULAR | Status: DC
  Administered 2017-05-16 – 2017-05-17 (×3): 30 mg via INTRAVENOUS
  Filled 2017-05-16 (×3): qty 1

## 2017-05-16 MED ORDER — SODIUM CHLORIDE 0.9% FLUSH: 10.0000 mL | INTRAVENOUS | Status: DC | PRN

## 2017-05-16 NOTE — Progress Notes (Signed)
Nutrition Follow-up  DOCUMENTATION CODES:   Not applicable  INTERVENTION:  - Continue Boost Breeze BID.  - Continue to encourage PO intakes.   NUTRITION DIAGNOSIS:   Inadequate oral intake related to acute illness, decreased appetite as evidenced by per patient/family report. -improving.  GOAL:   Patient will meet greater than or equal to 90% of their needs -minimally met at this time.   MONITOR:   PO intake, Supplement acceptance, Weight trends, Labs  ASSESSMENT:   52 year-old male who was d/c'ed on 4/11 and presented to the ED on 4/12 with shortness of breath. History of COPD and lung cancer. Mental status declined in route to the hospital. EMS reported he was speaking to them initially. He was intubated emergently.  Pt has been eating ~50-75% of meals since assessment on 4/30. He has accepted 7 of 9 cartons of Boost Breeze offered.  Per Dr. Marcello Moores' note this AM: pt underwent talc pleurodesis on 5/4 and repeat CXR showed a small pneumothorax; plan to continue chest tube suction. Pt will need to be cocaine-free for 6-12 months before he will be considered to be placed back on the transplant list.   Medications reviewed; 40 mg IV Lasix/day, sliding scale Novolog, 40 mg Solu-medrol BID. Labs reviewed; CBGs: 140 and 148 mg/dL today.     Diet Order:   Diet Order           Diet regular Room service appropriate? Yes; Fluid consistency: Thin  Diet effective now          EDUCATION NEEDS:   No education needs have been identified at this time  Skin:  Skin Assessment: Skin Integrity Issues: Skin Integrity Issues:: Incisions Incisions: R chest incision from 4/22  Last BM:  5/4  Height:   Ht Readings from Last 1 Encounters:  04/30/17 5' 7.5" (1.715 m)    Weight:   Wt Readings from Last 1 Encounters:  05/15/17 115 lb 4.8 oz (52.3 kg)    Ideal Body Weight:  67.27 kg  BMI:  Body mass index is 17.79 kg/m.  Estimated Nutritional Needs:   Kcal:  1950-2135 (32-35  kcal/kg)  Protein:  75-90 grams (1.2-1.5 grams/kg)  Fluid:  >/= 1.8 L/day      Jarome Matin, MS, RD, LDN, [facility] Inpatient Clinical Dietitian Pager # [phone] After hours/weekend pager # [phone]

## 2017-05-16 NOTE — Progress Notes (Signed)
Pt has no IV access and IV team attempted 2 times also using ultrasound.  Brandy NP notified of pt's BP and no access. Will continue to monitor

## 2017-05-16 NOTE — Progress Notes (Signed)
Physical Therapy Treatment Patient Details Name: [name] MRN: [id number] DOB: 12-30-1965 Today's Date: 05/16/2017    History of Present Illness 52 yo male admitted with acute respiratory failure, R pneumothorax. Hx of drug abuse, OSA, CHF, sarcoidosis, COPD-O2 dep 4L at baseline. CT pulled 05/09/17.    PT Comments    +2 min assist for stand pivot transfer to recliner with RW, ambulation deferred 2* 4/4 dyspnea with transfer, SaO2 73-86% on 6L O2 [name]. Pt requested to be put on BiPAP, RN and respiratory therapy notified. Decreased activity tolerance today.    Follow Up Recommendations  Home health PT;Supervision - Intermittent     Equipment Recommendations  None recommended by PT    Recommendations for Other Services       Precautions / Restrictions Precautions Precautions: Fall Precaution Comments: monitor vitals, chest tube Restrictions Weight Bearing Restrictions: No    Mobility  Bed Mobility               General bed mobility comments: sitting up on edge of bed  Transfers Overall transfer level: Needs assistance Equipment used: Rolling walker (2 wheeled) Transfers: Sit to/from [employer] Sit to Stand: Min assist;+2 safety/equipment Stand pivot transfers: +2 safety/equipment;Min assist       General transfer comment:  VC on proper hand placement and safety, SPT with RW, pt on 6L O2, SaO2 73-86%, HR 122, 4/4 dyspnea, pt requested BiPAP -RN notified  Ambulation/Gait                 Stairs             Wheelchair Mobility    Modified Rankin (Stroke Patients Only)       Balance                                            Cognition Arousal/Alertness: Awake/alert Behavior During Therapy: WFL for tasks assessed/performed Overall Cognitive Status: Within Functional Limits for tasks assessed                                        Exercises General Exercises - Lower Extremity Long Arc  Quad: AROM;Both;10 reps;Seated    General Comments        Pertinent Vitals/Pain Pain Score: 8  Pain Location: pain with coughing and chest tube site Pain Descriptors / Indicators: Sore Pain Intervention(s): Limited activity within patient's tolerance;Monitored during session;Premedicated before session    Home Living                      Prior Function            PT Goals (current goals can now be found in the care plan section) Acute Rehab PT Goals Patient Stated Goal: home. get back to baseline, likes golf PT Goal Formulation: With patient/family Time For Goal Achievement: 05/30/17 Potential to Achieve Goals: Fair Progress towards PT goals: Progressing toward goals    Frequency    Min 3X/week      PT Plan Current plan remains appropriate    Co-evaluation              AM-PAC PT "6 Clicks" Daily Activity  Outcome Measure  Difficulty turning over in bed (including adjusting bedclothes, sheets and blankets)?: A Lot Difficulty moving from  lying on back to sitting on the side of the bed? : A Lot Difficulty sitting down on and standing up from a chair with arms (e.g., wheelchair, bedside commode, etc,.)?: A Lot Help needed moving to and from a bed to chair (including a wheelchair)?: A Lot Help needed walking in hospital room?: A Lot Help needed climbing 3-5 steps with a railing? : Total 6 Click Score: 11    End of Session Equipment Utilized During Treatment: Oxygen Activity Tolerance: Treatment limited secondary to medical complications (Comment)(hypoxia with activity) Patient left: in chair;with call bell/phone within reach;with family/visitor present;with chair alarm set Nurse Communication: Mobility status PT Visit Diagnosis: Difficulty in walking, not elsewhere classified (R26.2);Other abnormalities of gait and mobility (R26.89)     Time: 1610-9604 PT Time Calculation (min) (ACUTE ONLY): 21 min  Charges:  $Therapeutic Activity: 8-22 mins                     G Codes:          Tamala Ser 05/16/2017, 11:29 AM [phone]

## 2017-05-16 NOTE — Progress Notes (Signed)
   05/16/17 1200  Clinical Encounter Type  Visited With Patient and family together  Visit Type Follow-up;Psychological support;Spiritual support;Critical Care  Referral From Nurse  Consult/Referral To Chaplain  Spiritual Encounters  Spiritual Needs Emotional;Other (Comment) (Spiritual Care Conversation/Support)  Stress Factors  Patient Stress Factors Other (Comment)  Family Stress Factors Other (Comment)   I visited with the patient per referral from the Nursing staff. The patient's wife has requested on two different occassions that a hospital notary notarize financial documents. Once again I reiterated that hospital staff members cannot notarize any other documents other than healthcare related documents.   Please, contact Spiritual Care for further assistance.   Chaplain Clint Bolder M.Div., [facility]

## 2017-05-16 NOTE — Progress Notes (Signed)
PULMONARY / CRITICAL CARE MEDICINE   Name: [name] MRN: [id number] DOB: 08-29-1965    ADMISSION DATE:  05/11/2017 CONSULTATION DATE:  May 18, 2017  REFERRING MD:  Rancour  CHIEF COMPLAINT:  Dyspnea  BRIEF SUMMARY:  52 yr old male, smoker, chronic cocaine/opiate abuse, with PMH of severe obstructive lung disease (followed by RB), burnt out sarcoid with large bullae, OSA on CPAP, chronic diastolic CHF,recently discharged on 04/21/17 presented on 4/12 s/p cocaine use with dyspnea and chest pain, intubated on mechanical ventilation s/p R chest tube for pneumothorax.Developed subcutaneous emphysema 4/13 when chest tube migrated out of thoracic cavity. Improved when new tube placed. CT to water seal 4/22, with increase in pneumothorax 4/23 imaging.  He decompensated on the night of 4/25 and intubated 4/26. Was extubated 4/27 to BiPAP.  Patient had chest tube inserted 5/1.  Talc pleurodesis 5/4.    SUBJECTIVE:  Pt continues to complain of right sided chest pain.  RN reports pt has no IV access and   VITAL SIGNS: BP (!) 101/56   Pulse (!) 117   Temp (!) 96.5 F (35.8 C) (Axillary)   Resp (!) 26   Ht 5' 7.5" (1.715 m)   Wt 115 lb 4.8 oz (52.3 kg)   SpO2 100%   BMI 17.79 kg/m    INTAKE / OUTPUT: I/O last 3 completed shifts: In: 1200 [P.O.:1200] Out: 1475 [Urine:1475]  PHYSICAL EXAMINATION: General:  Thin adult male in NAD HEENT: MM pink/moist, bipap mask in place Neuro: AAOx4, speech clear  CV: s1s2 rrr, no m/r/g PULM: even/non-labored, lungs bilaterally coarse rhonchi  HQ:IONG, non-tender, bsx4 active  Extremities: warm/dry, no edema, muscle wasting  Skin: no rashes or lesions  LABS:  BMET Recent Labs  Lab 05/11/17 0319 05/13/17 0740  NA 139 135  K 4.6 4.7  CL 95* 93*  CO2 33* 34*  BUN 27* 31*  CREATININE 0.64 0.72  GLUCOSE 120* 103*    Electrolytes Recent Labs  Lab 05/11/17 0319 05/13/17 0740  CALCIUM 8.7* 8.5*  MG  --  1.9  PHOS  --  2.7     CBC Recent Labs  Lab 05/11/17 0319 05/13/17 0740  WBC 10.9* 16.7*  HGB 10.6* 12.2*  HCT 34.9* 39.5  PLT 382 406*    Coag's No results for input(s): APTT, INR in the last 168 hours.  Sepsis Markers No results for input(s): LATICACIDVEN, PROCALCITON, O2SATVEN in the last 168 hours.  ABG Recent Labs  Lab 05/11/17 2210 05/11/17 2320  PHART 7.348* 7.361  PCO2ART 69.9* 68.3*  PO2ART 66.4* 438*    Liver Enzymes No results for input(s): AST, ALT, ALKPHOS, BILITOT, ALBUMIN in the last 168 hours.  Cardiac Enzymes Recent Labs  Lab 05/12/17 0251  TROPONINI 0.03*    Glucose Recent Labs  Lab 05/15/17 1650 05/15/17 1957 05/15/17 2306 05/16/17 0500 05/16/17 0754 05/16/17 1154  GLUCAP 123* 150* 93 140* 148* 73    Imaging No results found.   STUDIES:  Ct chest 4/12 >>Thin-walled cavitary lesion/bulla in the anterior right upper lobe measuring 10.7 x 7.3 cm (series 7/image 41). This is mildly increased from prior CT, but was previously more thick-walled.Additional paraseptal emphysematous changes, upper lobe predominant.  CULTURES:   ANTIBIOTICS: Doxycycline completed 7d  SIGNIFICANT EVENTS: 4/30 Acute onset dyspnea & desatn 5/01 PTX >> pigtail reinserted, unresponsive after ativan & reversed with flumazenil  LINES/TUBES: ETT 4/26 >> 4/27 Chest tube 4/12 >> 4/29, 5/1 >>   DISCUSSION: Patient has significant underlying parenchymal lung and airway disease  secondary to his sarcoidosis, cocaine abuse & tobacco hx, with bullous disease leading to his current ptx.  He had recurrent right-sided pneumothorax due to bullous disease.  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  Status post talc pleurodesis 5/1 P: Place chest tube back to suction  Flush chest tube per  protocol  If tube is flushing appropriately and PTX does not improve on suction, ? If basilar ptx will resolve.    Acute on Chronic HypoxicRespiratory Failure - secondary to sarcoid, severe bullous emphysema.  Completed 7 days doxycycline  Acute Hypercarbic Respiratory Failure  P: PRN BiPAP for respiratory fatigue  Solumedrol, reduce to 30 mg BID  O2 to support sats 88-95% Mobilize  PRN albuterol  Continue brovana + pulmicort  Duoneb Q4  Continue mucinex  Avoid sedating medications   Elevated trop - resolved SVT - resolved  P: Continue cardizem Change lasix from IV to PO, 40 mg QD PRN hydralazine   Place midline with difficult IV access   Substance abuse P: Substance abuse counseling Oxycodone PRN for pain control  Toradol PRN   Severe Protein Calorie Malnutrition  P: Continue boost    Canary Brim, NP-C [facility] Pgr: [phone] or if no answer [phone] 05/16/2017, 1:07 PM

## 2017-05-17 ENCOUNTER — Inpatient Hospital Stay (HOSPITAL_COMMUNITY): Payer: 59

## 2017-05-17 LAB — GLUCOSE, CAPILLARY
GLUCOSE-CAPILLARY: 119 mg/dL — AB (ref 65–99)
GLUCOSE-CAPILLARY: 189 mg/dL — AB (ref 65–99)
GLUCOSE-CAPILLARY: 117 mg/dL — AB (ref 65–99)
Glucose-Capillary: 104 mg/dL — ABNORMAL HIGH (ref 65–99)
Glucose-Capillary: 142 mg/dL — ABNORMAL HIGH (ref 65–99)
Glucose-Capillary: 160 mg/dL — ABNORMAL HIGH (ref 65–99)
Glucose-Capillary: 68 mg/dL (ref 65–99)
Glucose-Capillary: 71 mg/dL (ref 65–99)

## 2017-05-17 MED ORDER — OXYCODONE HCL 5 MG PO TABS
5.0000 mg | ORAL_TABLET | ORAL | Status: DC | PRN
  Administered 2017-05-17 (×3): 5 mg via ORAL
  Filled 2017-05-17 (×3): qty 1

## 2017-05-17 MED ORDER — LIDOCAINE HCL 1 % IJ SOLN: 10.0000 mL | Freq: Once | INTRAMUSCULAR | Status: DC

## 2017-05-17 MED ORDER — NYSTATIN 100000 UNIT/ML MT SUSP
5.0000 mL | Freq: Four times a day (QID) | OROMUCOSAL | Status: DC
  Administered 2017-05-17 (×4): 500000 [IU] via ORAL
  Filled 2017-05-17 (×4): qty 5

## 2017-05-17 MED ORDER — TALC (STERITALC) POWDER FOR INTRAPLEURAL USE
4.0000 g | Freq: Once | INTRAVENOUS | Status: DC
  Filled 2017-05-17: qty 4

## 2017-05-17 MED ORDER — ENSURE ENLIVE PO LIQD
237.0000 mL | Freq: Two times a day (BID) | ORAL | Status: DC
  Administered 2017-05-17: 237 mL via ORAL

## 2017-05-17 MED ORDER — MORPHINE SULFATE (PF) 4 MG/ML IV SOLN
2.0000 mg | INTRAVENOUS | Status: AC | PRN
  Administered 2017-05-17 (×2): 2 mg via INTRAVENOUS
  Filled 2017-05-17 (×2): qty 1

## 2017-05-17 MED ORDER — SODIUM CHLORIDE 0.9 % IV SOLN
500.0000 mg | Freq: Once | INTRAVENOUS | Status: DC
  Filled 2017-05-17: qty 500

## 2017-05-17 NOTE — Progress Notes (Signed)
   05/17/17 1100  Clinical Encounter Type  Visited With Patient  Visit Type Psychological support;Follow-up;Spiritual support;Critical Care  Referral From Patient  Consult/Referral To Chaplain  Spiritual Encounters  Spiritual Needs Emotional;Other (Comment) (Spiritual Care Conversation/Support)  Stress Factors  Patient Stress Factors Financial concerns;Health changes;Major life changes   I followed up with the patient per previous conversations. The patient stated that he and his wife wanted to make sure that his finances were taken care of before he passes away.  He stated that his wife is carrying out his wishes at this time.   Please, contact Spiritual Care for further assistance.   Chaplain Clint Bolder M.Div., [facility]

## 2017-05-17 NOTE — Progress Notes (Signed)
PULMONARY / CRITICAL CARE MEDICINE   Name: [name] MRN: [id number] DOB: [birth year]    ADMISSION DATE:  04/18/2017 CONSULTATION DATE:  04/29/2017  REFERRING MD:  Rancour  CHIEF COMPLAINT:  Dyspnea  BRIEF SUMMARY:  52 yr old male, smoker, chronic cocaine/opiate abuse, with PMH of severe obstructive lung disease (followed by RB), burnt out sarcoid with large bullae, OSA on CPAP, chronic diastolic CHF,recently discharged on 04/21/17 presented on 4/12 s/p cocaine use with dyspnea and chest pain, intubated on mechanical ventilation s/p R chest tube for pneumothorax.Developed subcutaneous emphysema 4/13 when chest tube migrated out of thoracic cavity. Improved when new tube placed. CT to water seal 4/22, with increase in pneumothorax 4/23 imaging.  He decompensated on the night of 4/25 and intubated 4/26. Was extubated 4/27 to BiPAP.  Patient had chest tube inserted 5/1.  Talc pleurodesis 5/4.    SUBJECTIVE:  Pt reports ongoing right sided chest pain.  "feels like the pain medication didn't work today".    VITAL SIGNS: BP 95/66   Pulse (!) 117   Temp 98 F (36.7 C) (Axillary)   Resp (!) 28   Ht 5' 7.5" (1.715 m)   Wt 110 lb 10.7 oz (50.2 kg)   SpO2 99%   BMI 17.08 kg/m    INTAKE / OUTPUT: I/O last 3 completed shifts: In: 1110 [P.O.:1080; Other:30] Out: 2650 [Urine:2550; Chest Tube:100]  PHYSICAL EXAMINATION: General: cachectic male, sitting up in chair  HEENT: MM pink/moist, mm pink/moist Neuro: AAOx4, speech clear, MAE CV: s1s2 rrr, no m/r/g PULM: even/non-labored, lungs bilaterally diminished with scattered rhonchi  JX:BJYN, non-tender, bsx4 active  Extremities: warm/dry, no edema  Skin: no rashes or lesions, right chest tattoo   LABS:  BMET Recent Labs  Lab 05/11/17 0319 05/13/17 0740  NA 139 135  K 4.6 4.7  CL 95* 93*  CO2 33* 34*  BUN 27* 31*  CREATININE 0.64 0.72  GLUCOSE 120* 103*    Electrolytes Recent Labs  Lab 05/11/17 0319 05/13/17 0740   CALCIUM 8.7* 8.5*  MG  --  1.9  PHOS  --  2.7    CBC Recent Labs  Lab 05/11/17 0319 05/13/17 0740  WBC 10.9* 16.7*  HGB 10.6* 12.2*  HCT 34.9* 39.5  PLT 382 406*    Coag's No results for input(s): APTT, INR in the last 168 hours.  Sepsis Markers No results for input(s): LATICACIDVEN, PROCALCITON, O2SATVEN in the last 168 hours.  ABG Recent Labs  Lab 05/11/17 2210 05/11/17 2320  PHART 7.348* 7.361  PCO2ART 69.9* 68.3*  PO2ART 66.4* 438*    Liver Enzymes No results for input(s): AST, ALT, ALKPHOS, BILITOT, ALBUMIN in the last 168 hours.  Cardiac Enzymes Recent Labs  Lab 05/12/17 0251  TROPONINI 0.03*    Glucose Recent Labs  Lab 05/16/17 1950 05/16/17 2305 05/17/17 0031 05/17/17 0418 05/17/17 0815 05/17/17 1151  GLUCAP 71 68 160* 119* 189* 117*    Imaging Dg Chest Port 1 View  Result Date: 05/17/2017 CLINICAL DATA:  Respiratory failure, pneumothorax EXAM: PORTABLE CHEST 1 VIEW COMPARISON:  Portable exam 0451 hours compared to 05/15/2017 FINDINGS: Pigtail RIGHT thoracostomy tube again identified at the inferior RIGHT chest. Stable heart size and mediastinal contours. Emphysematous changes with persistent infiltrates in RIGHT mid lung and in LEFT lower lobe. Decrease in loculated pneumothorax at the RIGHT base. Severe emphysematous changes at the RIGHT upper lobe, less at LEFT upper lobe. Postsurgical changes LEFT upper lobe. No pleural effusion identified. IMPRESSION: RIGHT thoracostomy tube with  decreased RIGHT basilar pneumothorax. Bullous COPD changes with persistent BILATERAL pulmonary infiltrates. Electronically Signed   By: Ulyses Southward M.D.   On: 05/17/2017 09:24    STUDIES:  Ct chest 4/12 >>Thin-walled cavitary lesion/bulla in the anterior right upper lobe measuring 10.7 x 7.3 cm (series 7/image 41). This is mildly increased from prior CT, but was previously more thick-walled.Additional paraseptal emphysematous changes, upper lobe  predominant.  CULTURES:   ANTIBIOTICS: Doxycycline completed 7d  SIGNIFICANT EVENTS: 4/30 Acute onset dyspnea & desatn 5/01 PTX >> pigtail reinserted, unresponsive after ativan & reversed with flumazenil  LINES/TUBES: ETT 4/26 >> 4/27 Chest tube 4/12 >> 4/29, 5/1 >>   DISCUSSION: Patient has significant underlying parenchymal lung and airway disease secondary to his sarcoidosis, cocaine abuse & tobacco hx, with bullous disease leading to his current ptx.  He had recurrent right-sided pneumothorax due to bullous disease.  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  Status post talc pleurodesis 5/1 P: Keep chest tube to suction until Talc ready from pharmacy > then proceed with talc pleurodesis.   Chest tube care & flushing per protocol   Acute on Chronic HypoxicRespiratory Failure - secondary to sarcoid, severe bullous emphysema.  Completed 7 days doxycycline  Acute Hypercarbic Respiratory Failure  P: PRN BiPAP  Solumedrol 30 mg BID, consider reduction 5/8  O2 to support sats 88-95% Mobilize  Brovana + Pulmicort, PRN albuterol  Duoneb Q4  Mucinex Avoid sedating meds  Elevated trop - resolved SVT - resolved  P: Cardizem  Lasix 40 mg PO QD  PRN hydralazine  Assess TSH  Substance abuse P: Ongoing substance abuse counseling  Oxycodone PRN  Toradol PRN   Severe Protein Calorie Malnutrition  P: Continue nutritional supplement    Canary Brim, NP-C [facility] Pgr: [phone] or if no answer [phone] 05/17/2017, 12:08 PM

## 2017-05-18 ENCOUNTER — Inpatient Hospital Stay (HOSPITAL_COMMUNITY): Payer: 59

## 2017-05-18 LAB — GLUCOSE, CAPILLARY: GLUCOSE-CAPILLARY: 101 mg/dL — AB (ref 65–99)

## 2017-05-18 MED ORDER — NALOXONE HCL 0.4 MG/ML IJ SOLN
INTRAMUSCULAR | Status: AC
  Filled 2017-05-18: qty 1

## 2017-05-18 MED ORDER — NALOXONE HCL 0.4 MG/ML IJ SOLN: 0.4000 mg | INTRAMUSCULAR | Status: DC | PRN

## 2017-05-18 MED ORDER — SODIUM BICARBONATE 8.4 % IV SOLN
INTRAVENOUS | Status: AC
  Filled 2017-05-18: qty 150

## 2017-05-18 MED FILL — Medication: Qty: 1 | Status: AC

## 2017-05-19 ENCOUNTER — Telehealth: Payer: Self-pay

## 2017-05-19 NOTE — Telephone Encounter (Signed)
On 05/19/17 I received a d/c from [facility] (original). The d/c is for burial. The patient is a patient of Doctor Scatliffe. The d/c will be taken to [facility] ([facility] ) for signature.  On 05/24/17 I received the d/c back from Doctor Craige Cotta who signed the d/c for Doctor Scatliffe. I got the d/c ready and called the funeral home to let them know the d/c is ready for pickup.

## 2017-05-20 LAB — GLUCOSE, CAPILLARY: Glucose-Capillary: 124 mg/dL — ABNORMAL HIGH (ref 65–99)

## 2017-06-11 NOTE — Progress Notes (Signed)
Pt off unit to morgue.

## 2017-06-11 NOTE — Progress Notes (Signed)
CODE BLUE note:  Patient with pigtail chest tube in place reported to have sudden onset of dyspnea and collapsed.  He did vomit.  He was noted to be pulseless and CPR started, CODE BLUE called.  On my arrival, CPR was in progress.  Monitor showed asystole.  He did not have good venous access, so left intraosseous line was inserted by me.  He was intubated and noted to have gross aspiration at time of intubation.  He was given epinephrine and bicarbonate and continued to be in asystole.  Attempt was made to insert a right femoral line, but femoral vein was collapsed and I was not able to cannulate it while CPR was in progress.  Intensivist has arrived and taken over care at this point.  Cardiopulmonary Resuscitation (CPR) Procedure Note Directed/Performed by: Dione Booze I personally directed ancillary staff and/or performed CPR in an effort to regain return of spontaneous circulation and to maintain cardiac, neuro and systemic perfusion.   Procedures Procedure Name: Intubation Date/Time: 06-17-2017 12:30 AM Performed by: Dione Booze, MD Pre-anesthesia Checklist: Patient identified, Emergency Drugs available, Timeout performed, Suction available and Patient being monitored Oxygen Delivery Method: Non-rebreather mask Preoxygenation: Pre-oxygenation with 100% oxygen Ventilation: Mask ventilation without difficulty Laryngoscope Size: Glidescope and 3 Grade View: Grade I Tube size: 7.5 mm Number of attempts: 1 Airway Equipment and Method: Video-laryngoscopy and Rigid stylet Placement Confirmation: ETT inserted through vocal cords under direct vision,  Positive ETCO2 and Breath sounds checked- equal and bilateral Secured at: 24 cm Tube secured with: ETT holder Dental Injury: Teeth and Oropharynx as per pre-operative assessment  Comments: Gross aspiration noted.

## 2017-06-11 NOTE — Progress Notes (Signed)
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 Serial pneumothoraces. The night of 5/7 he was sitting in chair and described trouble breathing. He was placed on BiPAP. Very quickly he became unresponsive and bradycardic into asystole.  Code Blue was activated when he was noted to be without pulse. Upon my arrival, code blue had been in progress for 10 mins. Intubated by [name]. See code documentation for medication administration information.    When I was able to evaluate the patient he was in asystole. Good sliding lung on L side with [place] evaluation. NO sliding lung on Right side (the side of known PTX). Needle decompressed with 14 gauge IV Angiocath at the second ICS mid clavicular line. Air rush identified when needle removed from angiocath. Few more rounds of CPR as chest tube was being prepared. At this point had been in asystole for approximately 35-40 mins. Decision was made by medical team including myself and Dr. Carlota Raspberry to call time of death at 0103  Joneen Roach, [facility] Pulmonology/Critical Care Pager [phone] or [phone]  May 19, 2017 1:11 AM

## 2017-06-11 NOTE — Progress Notes (Signed)
   05/20/2017 0200  Clinical Encounter Type  Visited With Patient and family together;Health care provider  Visit Type Initial;Psychological support;Spiritual support;Death;Code  Referral From Care management  Consult/Referral To Faith community  Spiritual Encounters  Spiritual Needs Grief support  Stress Factors  Patient Stress Factors None identified  Family Stress Factors Loss   Name: [name]  Location: [place] [address]  Ashby Dawes of call: Code and END of LIFE  Chaplain was page and made aware of the code blue around 12:30am. Staff said they believed things were calming down but wanted to make chaplain aware. They continued by saying they will page again if things "get bad." AC called again (around 1:00am) and informed chaplain that Pt had passed away. When chaplain arrived wife and Pt's two sons were at bedside. Chaplain offered and provided emotional support via prayer. Chaplain extended hospitality by providing water. Chaplain also provided wife with patient placement information.

## 2017-06-11 NOTE — Progress Notes (Signed)
Ett removed per verbal order by Dr Carlota Raspberry, Charge RN made aware.

## 2017-06-11 NOTE — Progress Notes (Signed)
This RT present in room during code blue.  Pt bagged during code on 15L o2.  ETCO2 in place=18.  ROSC never achieved.  Pt expired, pronounced by Dr Carlota Raspberry at bedside.

## 2017-06-11 DEATH — deceased

## 2017-06-16 DIAGNOSIS — I471 Supraventricular tachycardia: Secondary | ICD-10-CM | POA: Diagnosis not present

## 2017-06-16 DIAGNOSIS — E46 Unspecified protein-calorie malnutrition: Secondary | ICD-10-CM | POA: Diagnosis present

## 2017-06-16 DIAGNOSIS — Z720 Tobacco use: Secondary | ICD-10-CM | POA: Diagnosis present

## 2017-06-16 DIAGNOSIS — I214 Non-ST elevation (NSTEMI) myocardial infarction: Secondary | ICD-10-CM | POA: Diagnosis not present

## 2017-06-27 IMAGING — CR DG CHEST 2V
2 series · 2 of 2 positions shown · non-contrast
Comparison: 11/24/2015

CLINICAL DATA: Worsening dyspnea over the past 3 days, much worse
tonight.

EXAM:
CHEST  2 VIEW

[w chest lat]
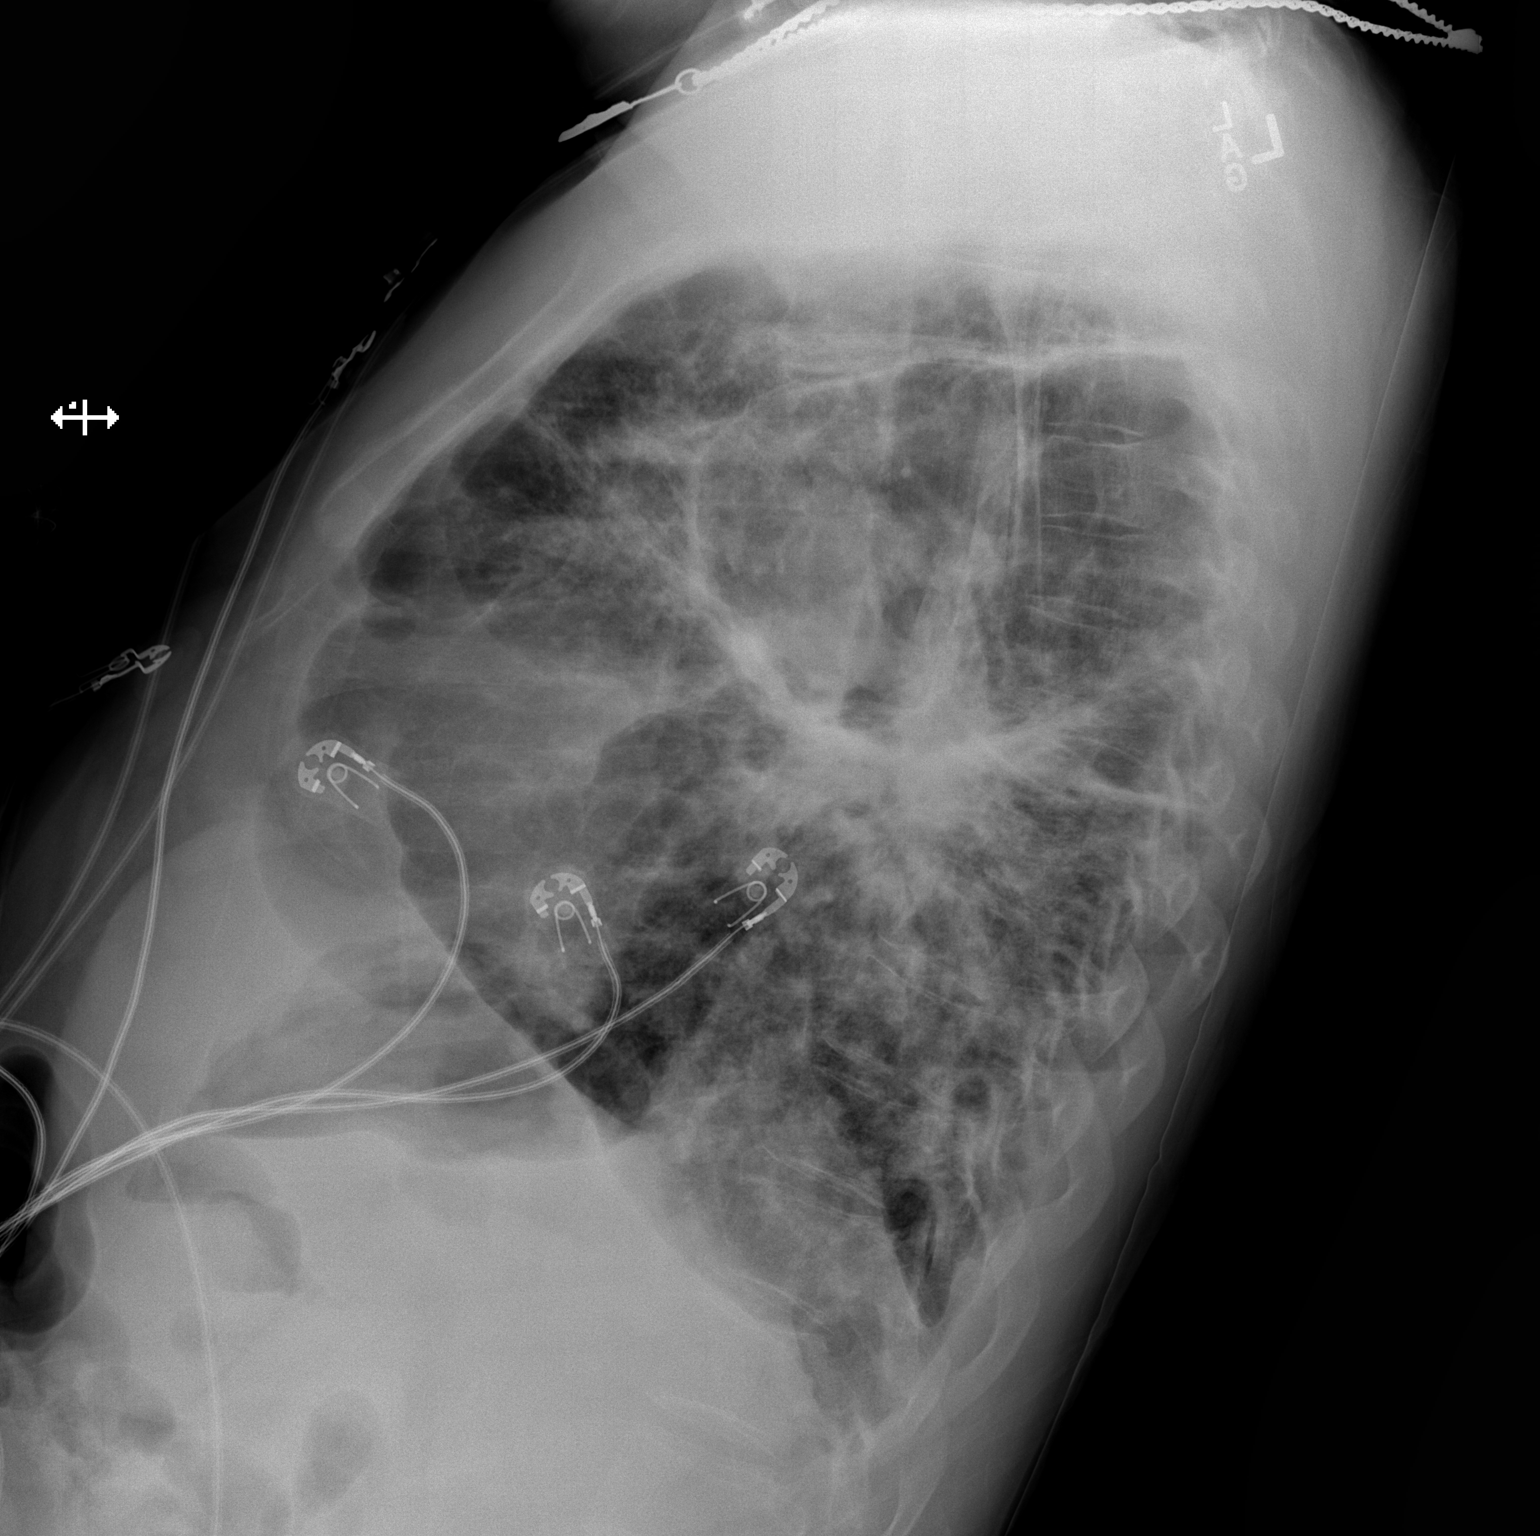

[x chest ap]
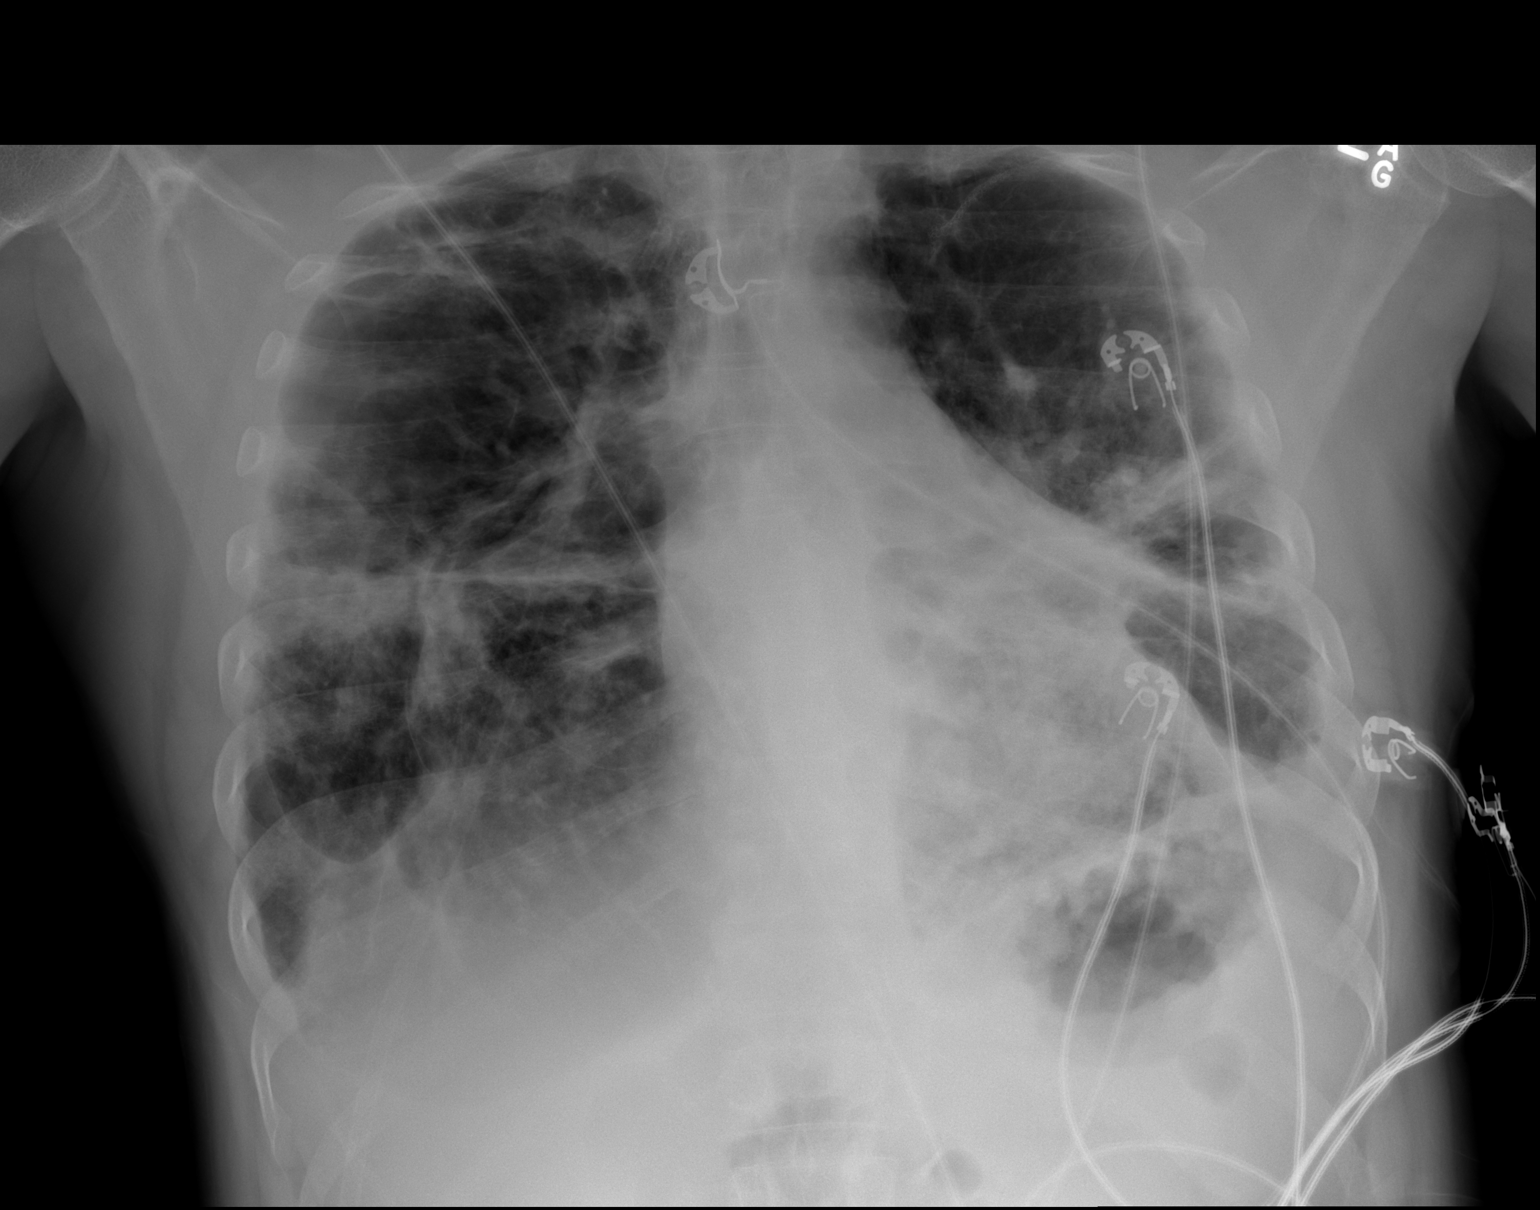

[2 of 2 positions shown; findings below may reference images not displayed]

FINDINGS: Severe chronic fibrotic changes consistent with the described
history of sarcoidosis. No superimposed acute consolidation. No
effusion. Unchanged hilar and mediastinal contours.
IMPRESSION: Severe chronic fibrotic changes consistent with sarcoidosis. No
superimposed acute consolidation or effusion.

## 2017-06-28 IMAGING — DX DG CHEST 2V
2 series · 2 of 2 positions shown · non-contrast
Comparison: Multiple prior comparison, with CT 09/30/2014. Most
recent plain film 04/16/2016

CLINICAL DATA: 50-year-old male with a history of shortness of
breath.

Known sarcoidosis.
EXAM:
CHEST  2 VIEW

[chest lat]
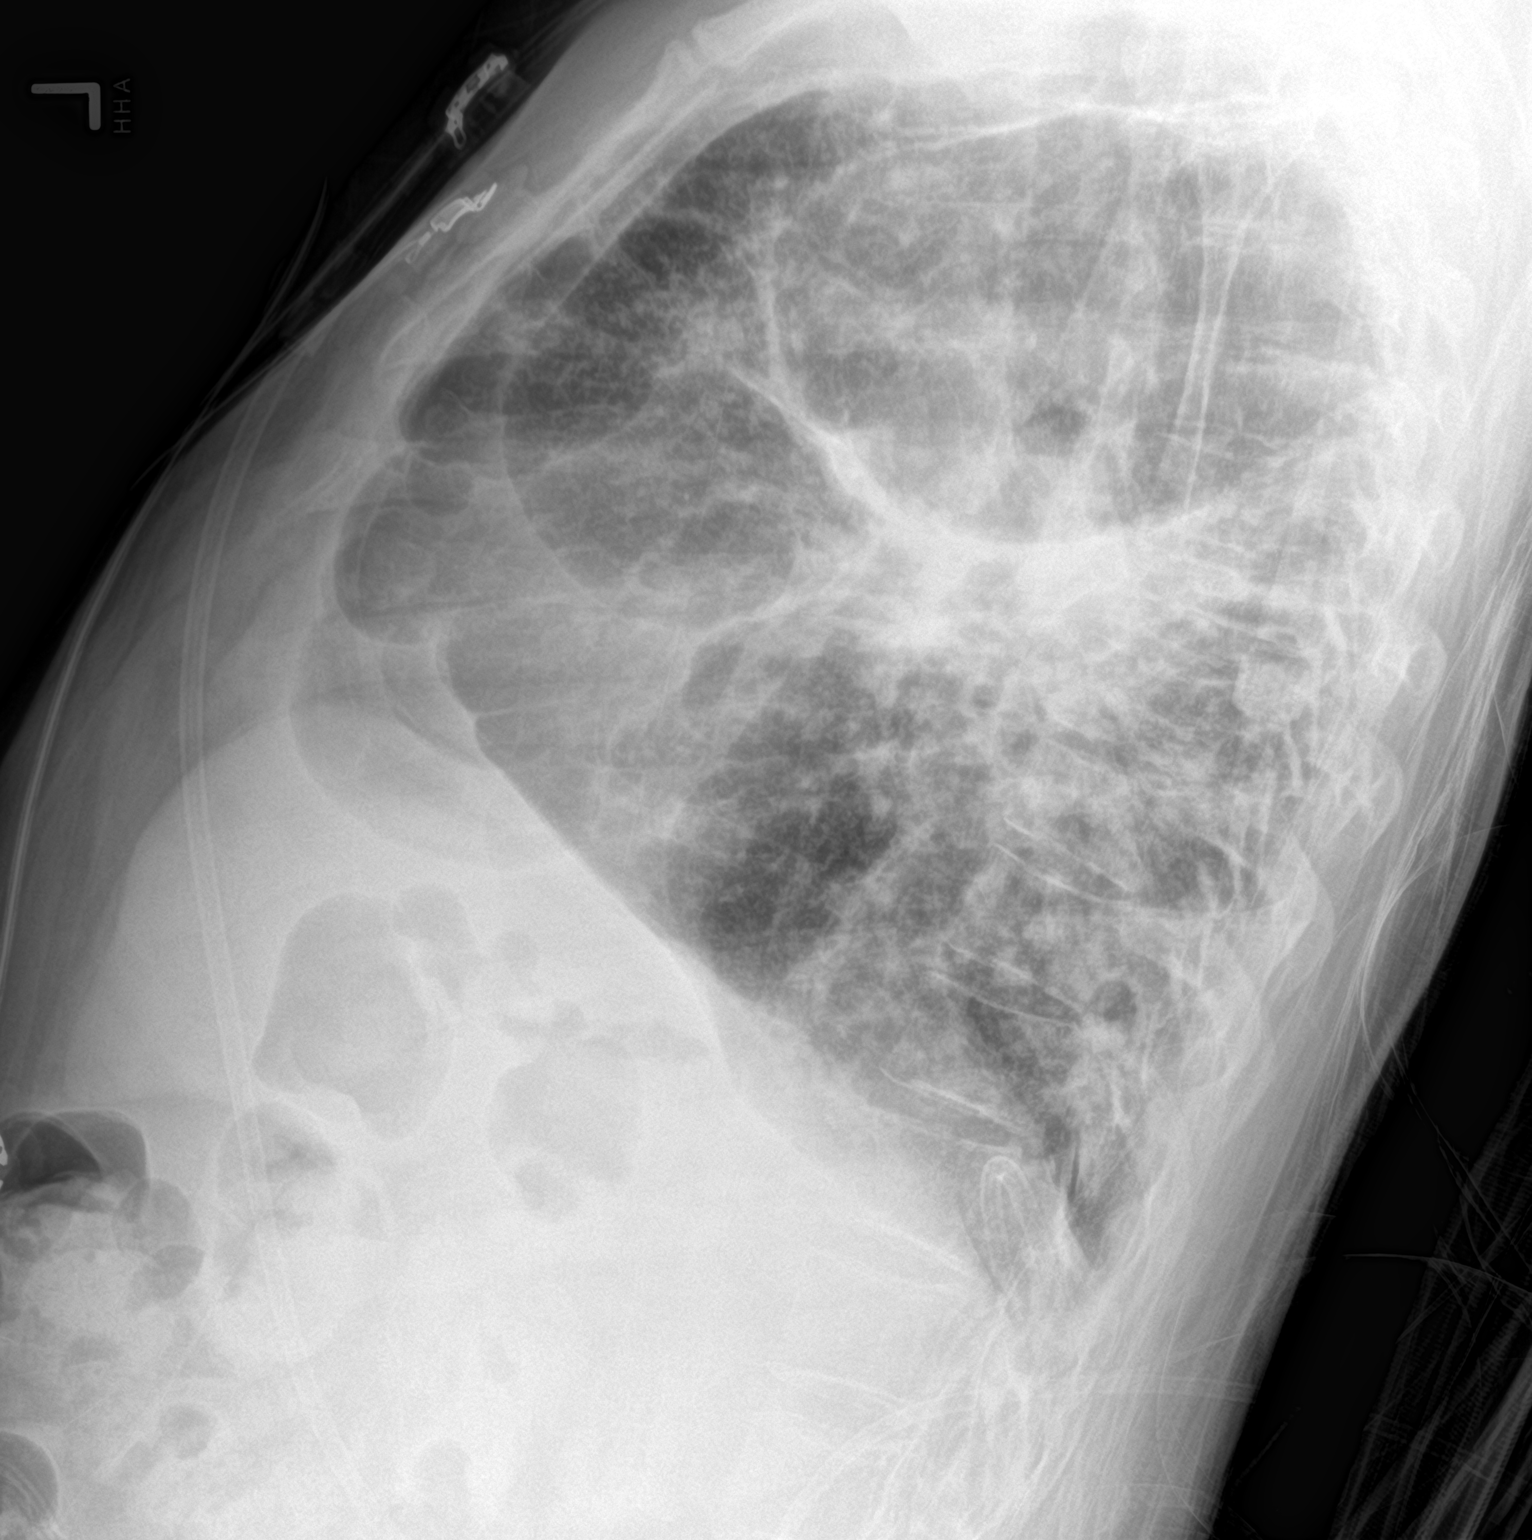

[chest ap]
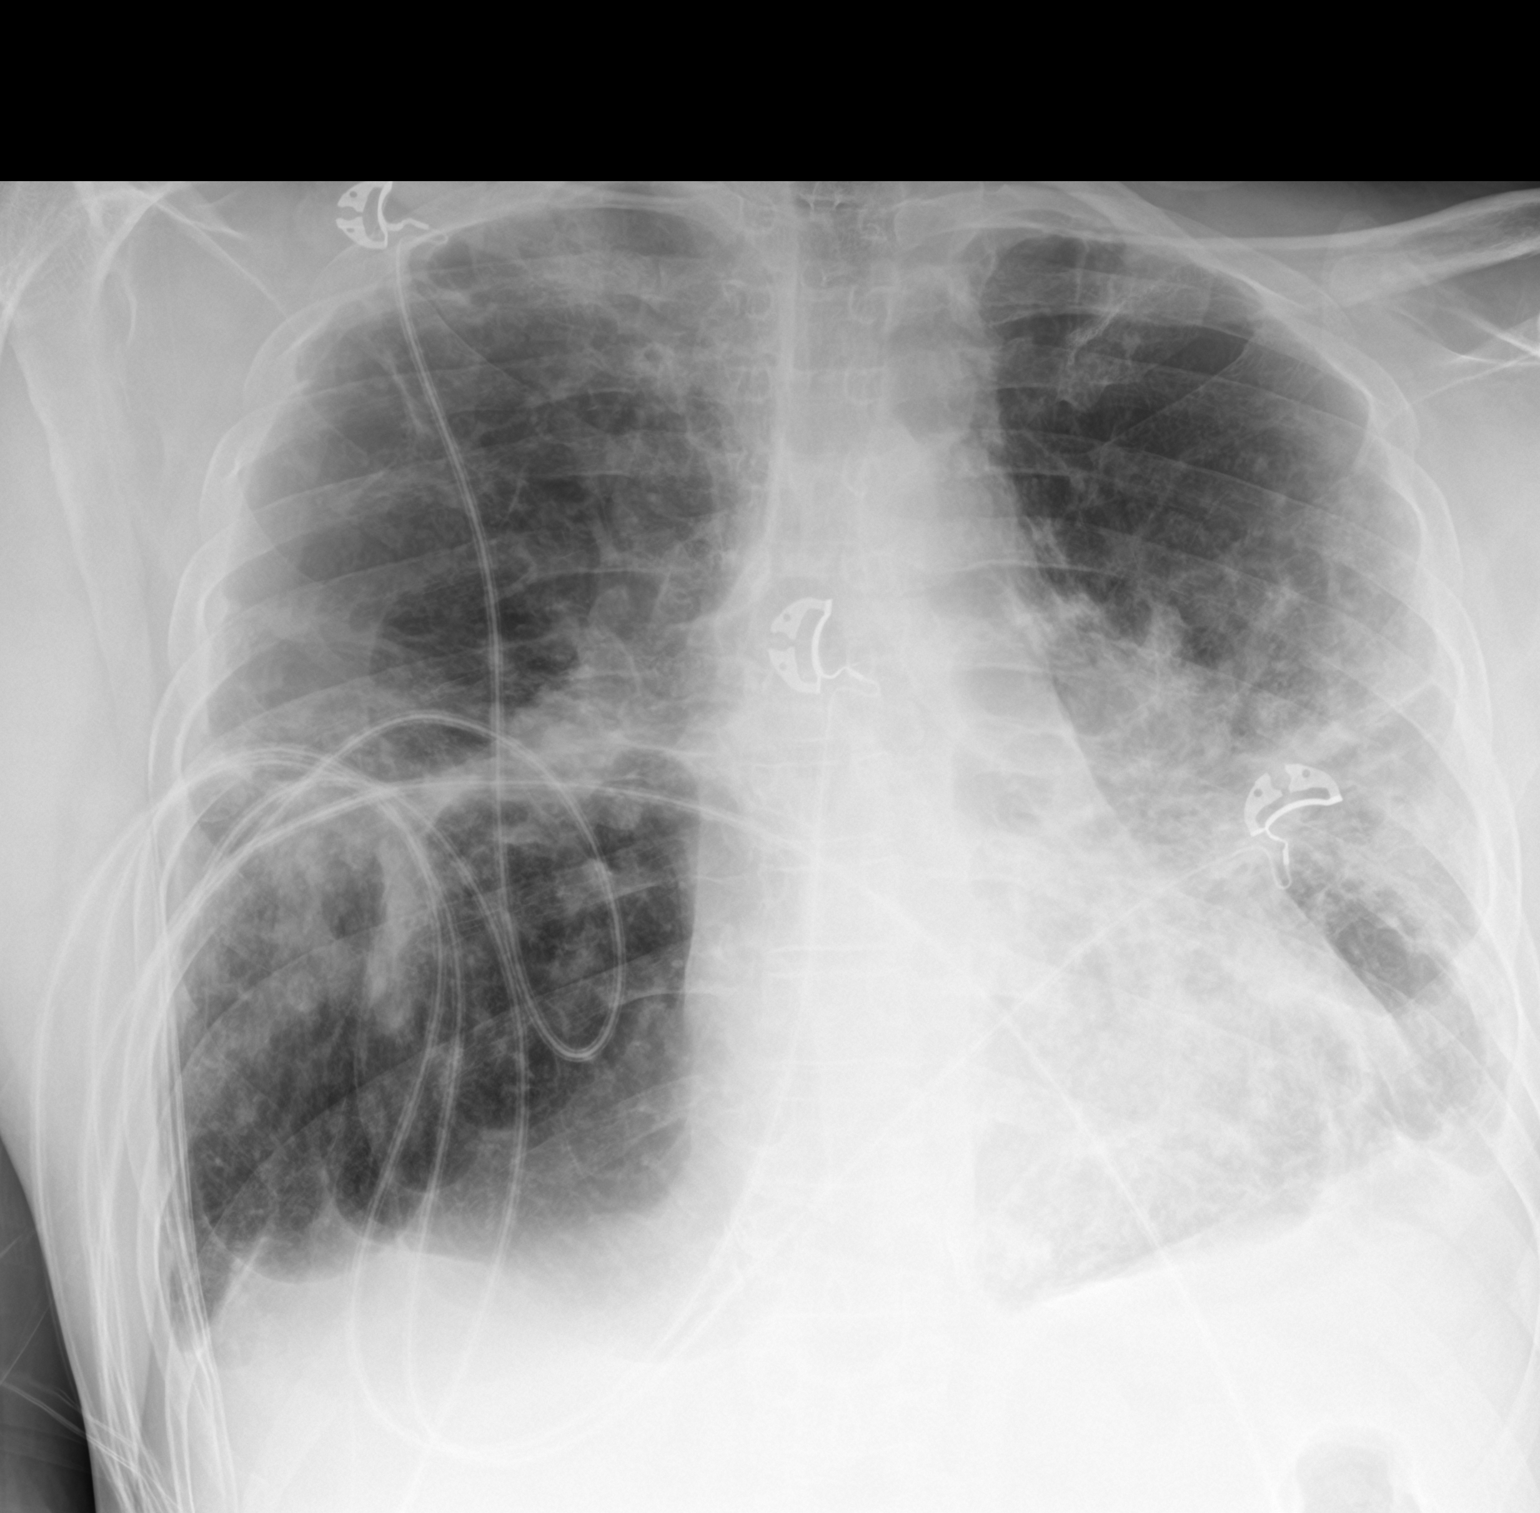

[2 of 2 positions shown; findings below may reference images not displayed]

FINDINGS: Cardiomediastinal silhouette unchanged. Heart borders partially
obscured by overlying lung/ pleural disease.

Compared to the prior chest x-ray studies, there is more pronounced
reticulonodular opacity in the left mid lung.

Blunting of the bilateral costophrenic sulci, similar to prior.

Diffuse architectural distortion, distributed evenly from superior
to inferior, with no predominance. Chronic changes better
characterized on prior CT study in this patient with known sarcoid.

No displaced fracture.
IMPRESSION: Advanced chronic lung disease bilaterally in this patient with known
sarcoidosis, with increasing reticulonodular opacity of the left mid
lung. Superimposed acute infection cannot be excluded.

## 2017-07-11 IMAGING — CR DG CHEST 2V
2 series · 2 of 2 positions shown · non-contrast
Comparison: Chest radiograph performed 04/17/2016

CLINICAL DATA: Acute onset of shortness of breath and left-sided to
central chest pain. Initial encounter.

EXAM:
CHEST  2 VIEW

[w chest pa]
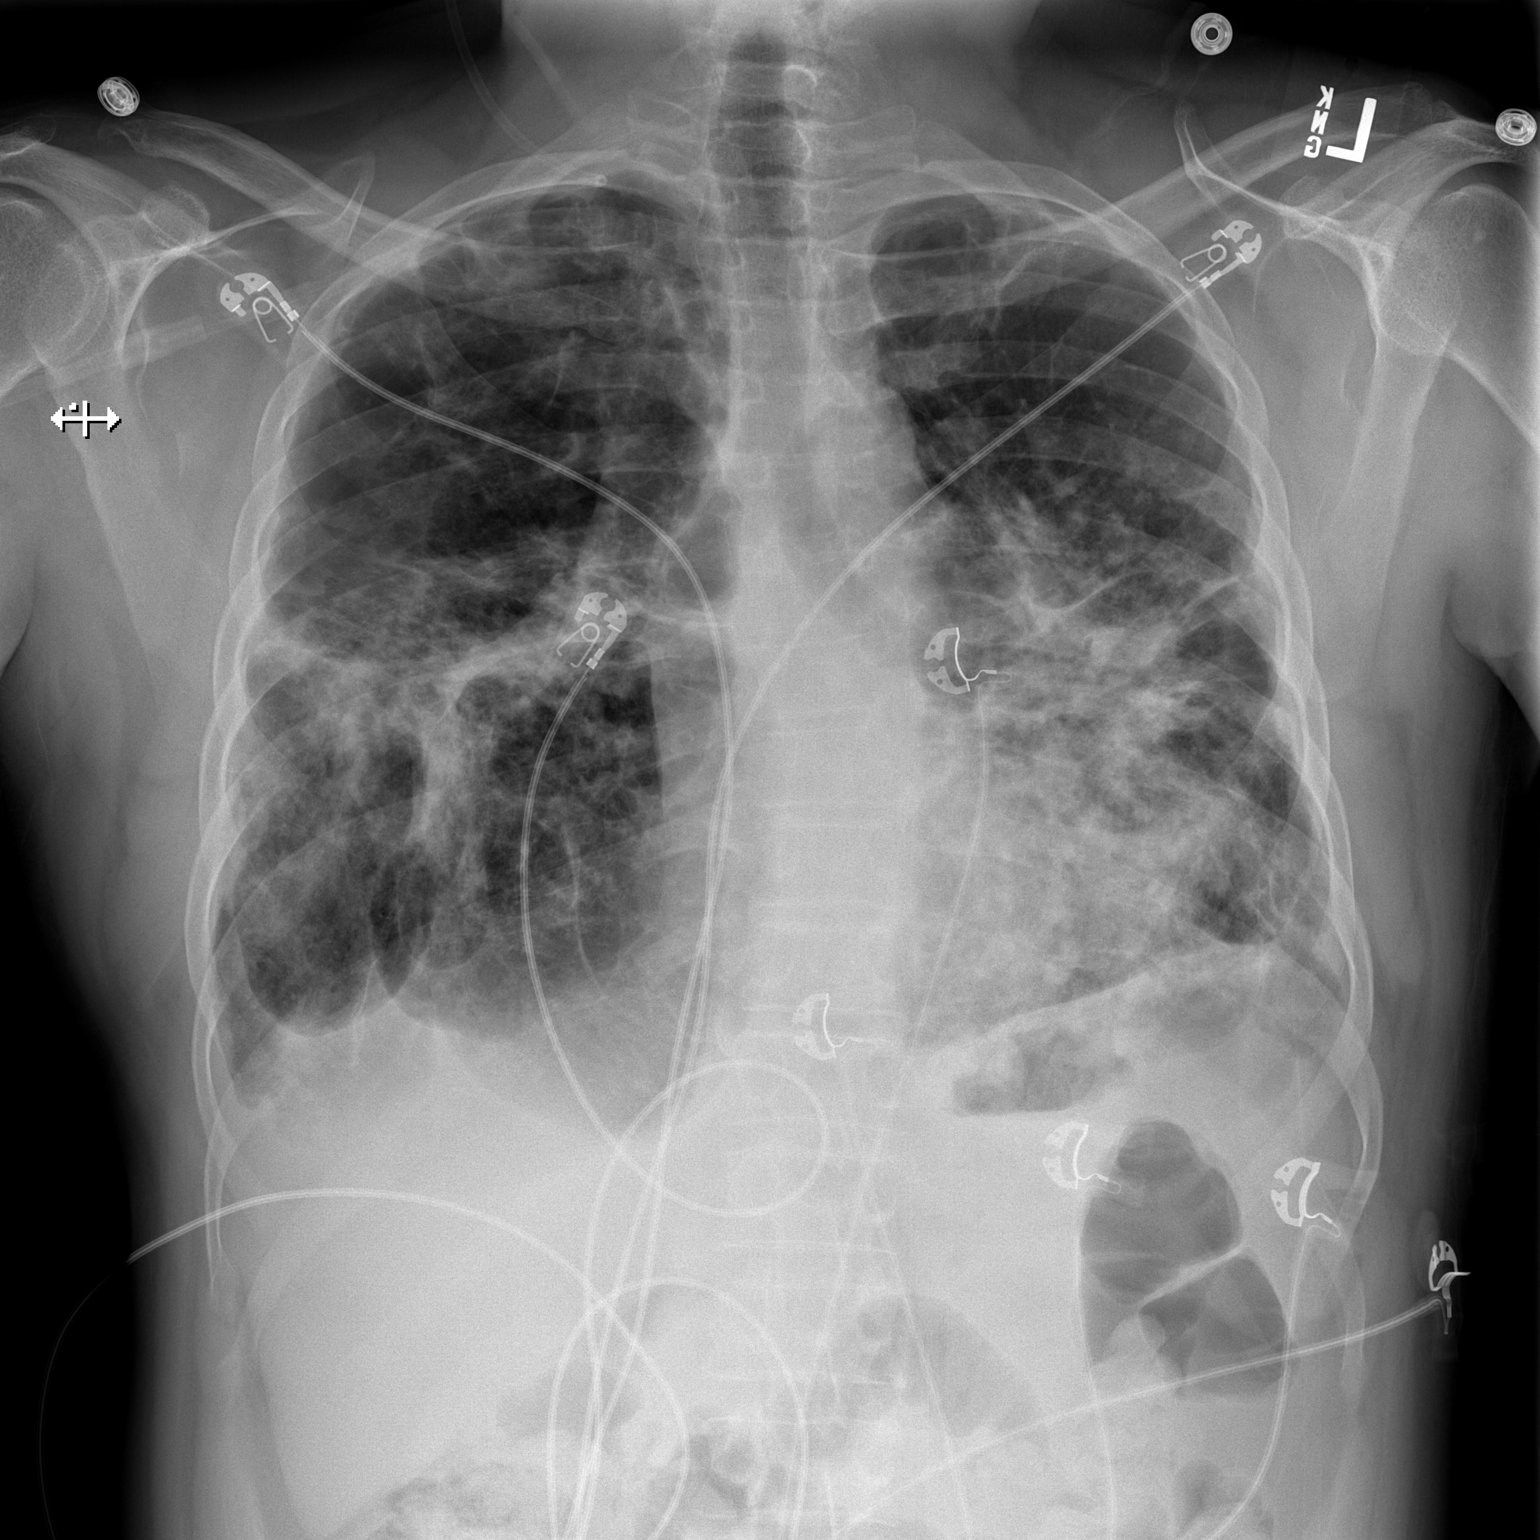

[w chest lat]
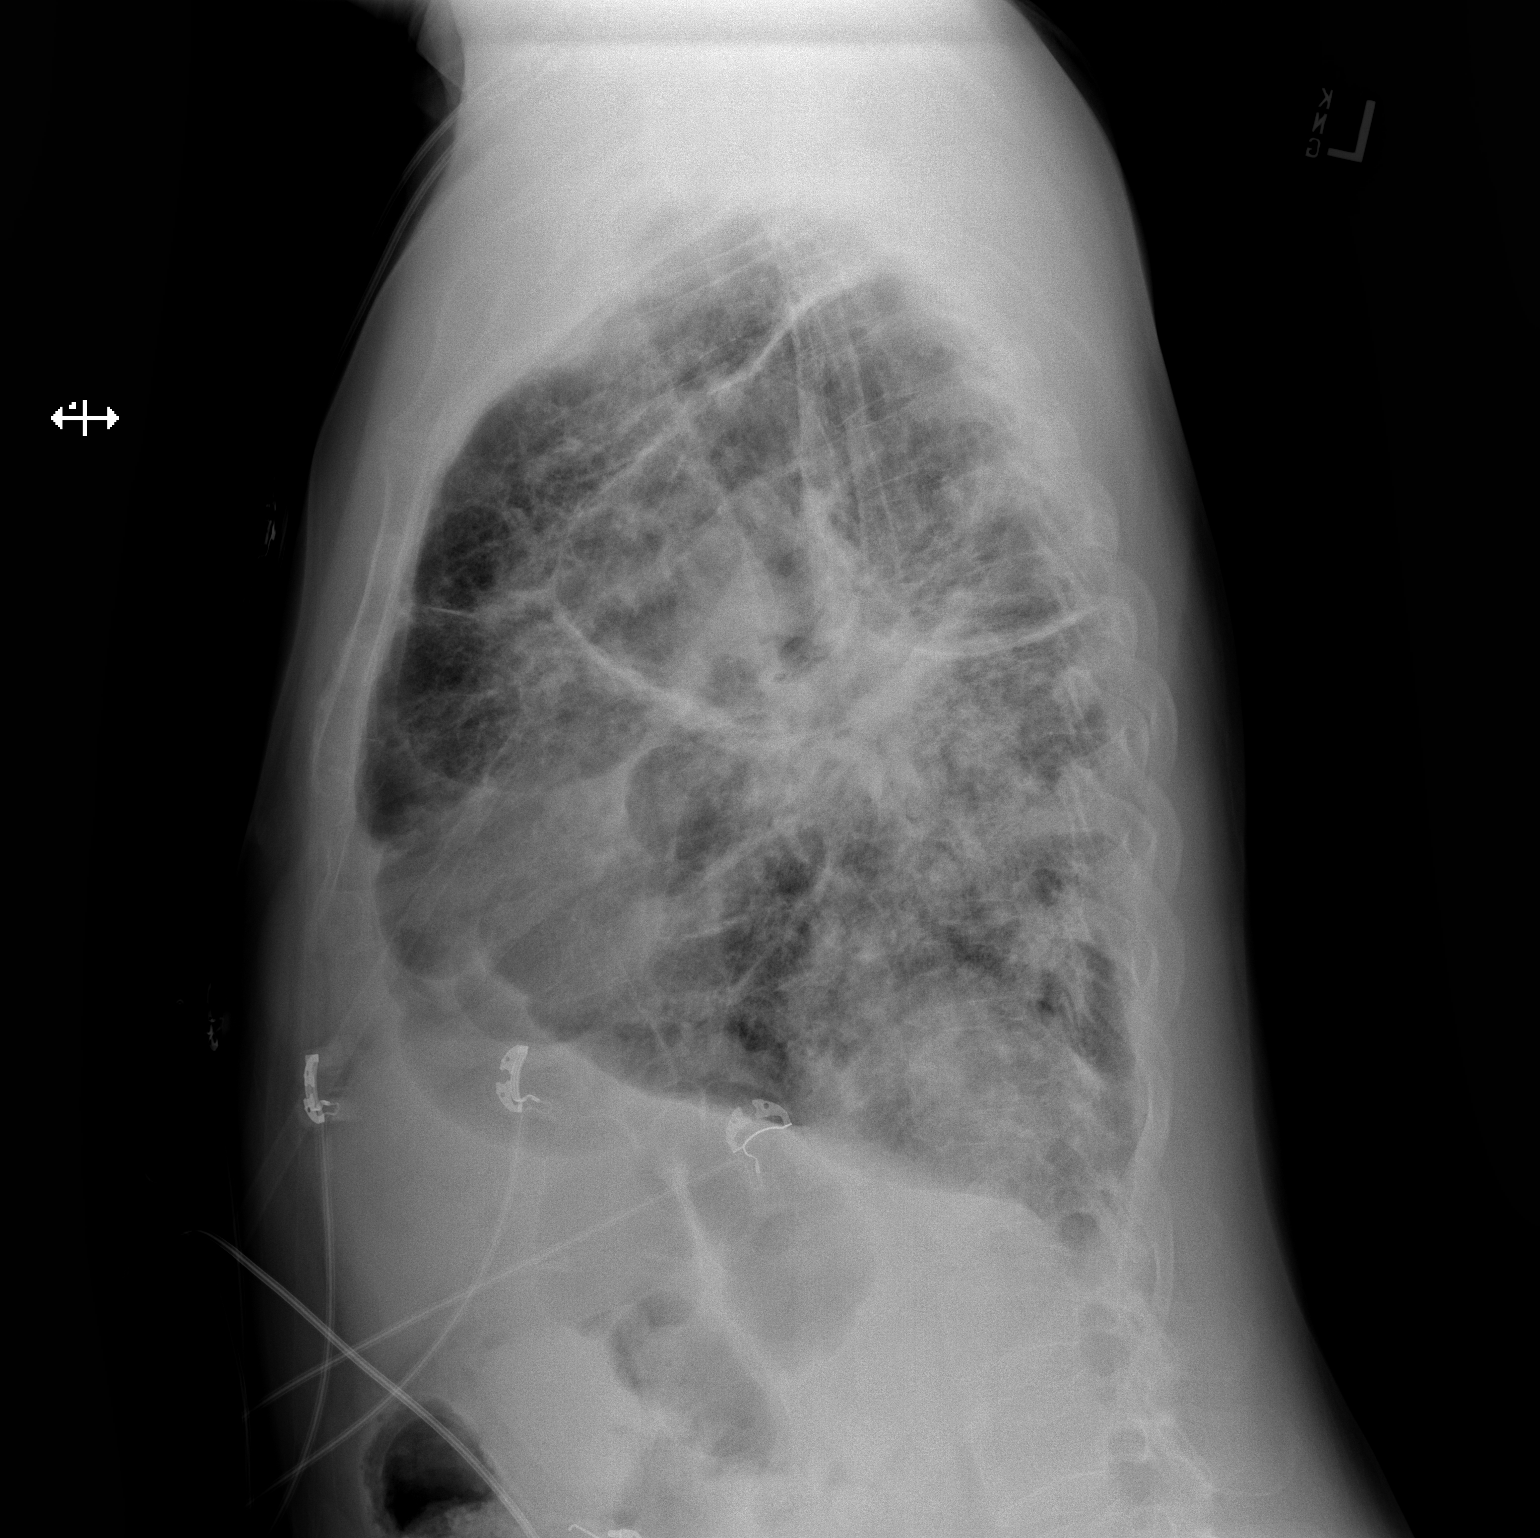

[2 of 2 positions shown; findings below may reference images not displayed]

FINDINGS: The lungs are well-aerated. Bilateral changes of sarcoidosis are
again noted, with a large underlying right upper lobe bulla.
Superimposed left basilar pneumonia is a concern. Small bilateral
pleural effusions are suspected. No pneumothorax is seen.

The heart is normal in size; the mediastinal contour is within
normal limits. No acute osseous abnormalities are seen.
IMPRESSION: Changes of sarcoidosis again noted, with large underlying right
upper lobe bulla. Superimposed left basilar pneumonia is a concern.
Suspect small bilateral pleural effusions.

## 2017-07-11 NOTE — Death Summary Note (Signed)
DEATH SUMMARY   Patient Details  Name: [name] MRN: [id number] DOB: 1965/04/14  Admission/Discharge Information   Admit Date:  05-14-2017  Date of Death: Date of Death: Jun 09, 2017  Time of Death: Time of Death: 0102  Length of Stay: 06-28-22  Referring Physician: [facility]   Reason(s) for Hospitalization  Right pneumothorax  Diagnoses  Preliminary cause of death:  Acute on chronic respiratory failure, cardiopulmonary arrest  Secondary Diagnoses (including complications and co-morbidities):  Active Problems:   Sarcoid (HCC)   OSA on CPAP   Bronchiectasis with (acute) exacerbation (HCC)   GERD (gastroesophageal reflux disease)   Acute on chronic respiratory failure with hypoxia (HCC)   HCAP (healthcare-associated pneumonia)   Pulmonary sarcoidosis (HCC)   Cocaine abuse (HCC)   Acute respiratory failure (HCC)   Pneumothorax   COPD with acute exacerbation (HCC)   Hypoxemia   Pneumothorax on right   Tobacco abuse   SVT (supraventricular tachycardia) (HCC)   NSTEMI (non-ST elevated myocardial infarction) (HCC)   Protein calorie malnutrition [facility])   Brief Hospital Course (including significant findings, care, treatment, and services provided and events leading to death)  [name] is a 52 y.o. year old male with a history of sarcoidosis and associated fiber cavitary disease, bronchiectasis.  He also has a history of substance abuse including tobacco and cocaine, hypertension with diastolic dysfunction and CHF, obstructive sleep apnea for which he uses CPAP.  He was discharged from the hospital 04/21/2017 after admission for multifactorial respiratory failure.  He returned the next day 05/14/17 with new acute dyspnea and right-sided chest discomfort.  He was found to have a right sided pneumothorax with tension physiology.  He had progressive dyspnea and respiratory failure ultimately requiring intubation and mechanical ventilation.  His cocaine was positive and  presumably he used cocaine which led to his acute pneumothorax.  Chest tube was placed, with a positive air leak and partial resolution of his right pneumothorax, resolution of his tension physiology.  He was treated with pulsed corticosteroids, bronchodilators, doxycycline for presumed acute exacerbation of his COPD.  He was also treated with scheduled diuretics for his hypertension and diastolic CHF.  His course was complicated by a non-STEMI and SVT.  He was rate controlled with diltiazem.  He was able to be extubated on 4/27.  His chest tube was removed on 4/29 but he developed recurrence of his pneumothorax and a pigtail had to be replaced on 5/1.  Again he had partial resolution of his right sided pneumothorax.  Efforts were made at trying to accomplish pleurodesis with talc as he was deemed to be a poor surgical candidate given his chronic lung disease.  On the evening of 5/7 he was sitting up and developed acute severe dyspnea that progressed to unresponsiveness.  He became bradycardic and then became asystolic.  CODE BLUE was initiated and he underwent CPR.  It was felt that he may have a recurrence of his right pneumothorax and an Angiocath was inserted with a good rush of air.  CPR was continued and unfortunately he remained in asystole for 35 to 40 minutes.  At that point CPR was discontinued and he was declared dead 06-09-17.    Pertinent Labs and Studies  Significant Diagnostic Studies No results found.  Microbiology No results found for this or any previous visit (from the past 240 hour(s)).  Lab Basic Metabolic Panel: No results for input(s): NA, K, CL, CO2, GLUCOSE, BUN, CREATININE, CALCIUM, MG, PHOS in the last 168 hours.  Liver Function Tests: No results for input(s): AST, ALT, ALKPHOS, BILITOT, PROT, ALBUMIN in the last 168 hours. No results for input(s): LIPASE, AMYLASE in the last 168 hours. No results for input(s): AMMONIA in the last 168 hours. CBC: No results for input(s):  WBC, NEUTROABS, HGB, HCT, MCV, PLT in the last 168 hours. Cardiac Enzymes: No results for input(s): CKTOTAL, CKMB, CKMBINDEX, TROPONINI in the last 168 hours. Sepsis Labs: No results for input(s): PROCALCITON, WBC, LATICACIDVEN in the last 168 hours.  Procedures/Operations  Endotracheal intubation and mechanical ventilation Right tube thoracostomy CPR   Leslye PeerRobert S Patrena Santalucia 06/16/2017, 7:46 PM

## 2017-07-15 IMAGING — DX DG CHEST 2V
2 series · 2 of 2 positions shown · non-contrast
Comparison: Chest radiograph performed 04/30/2016

CLINICAL DATA: Acute onset of dyspnea. Question of left basilar
pneumonia on prior chest radiograph. Subsequent encounter.

EXAM:
CHEST  2 VIEW

[chest pa]
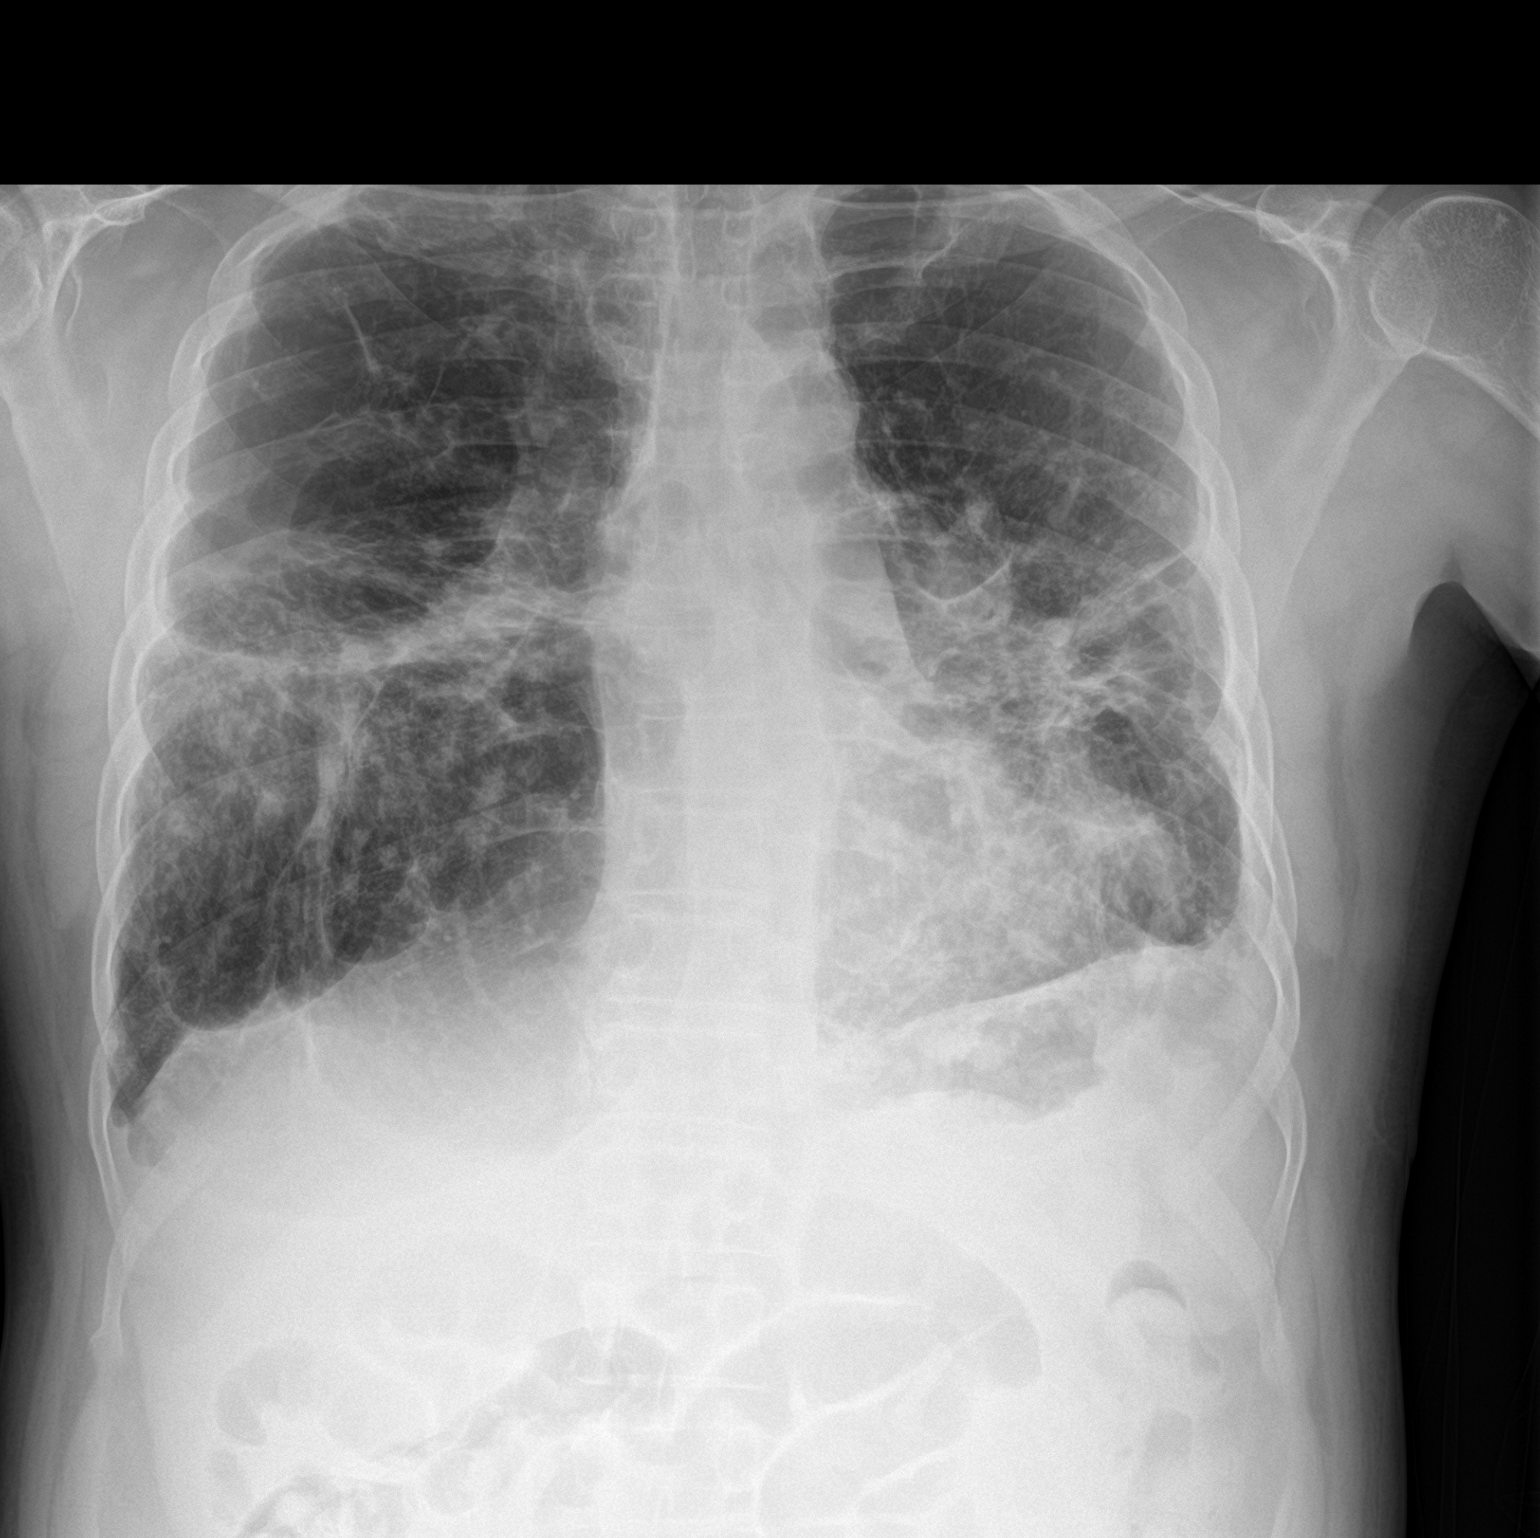

[chest lat]
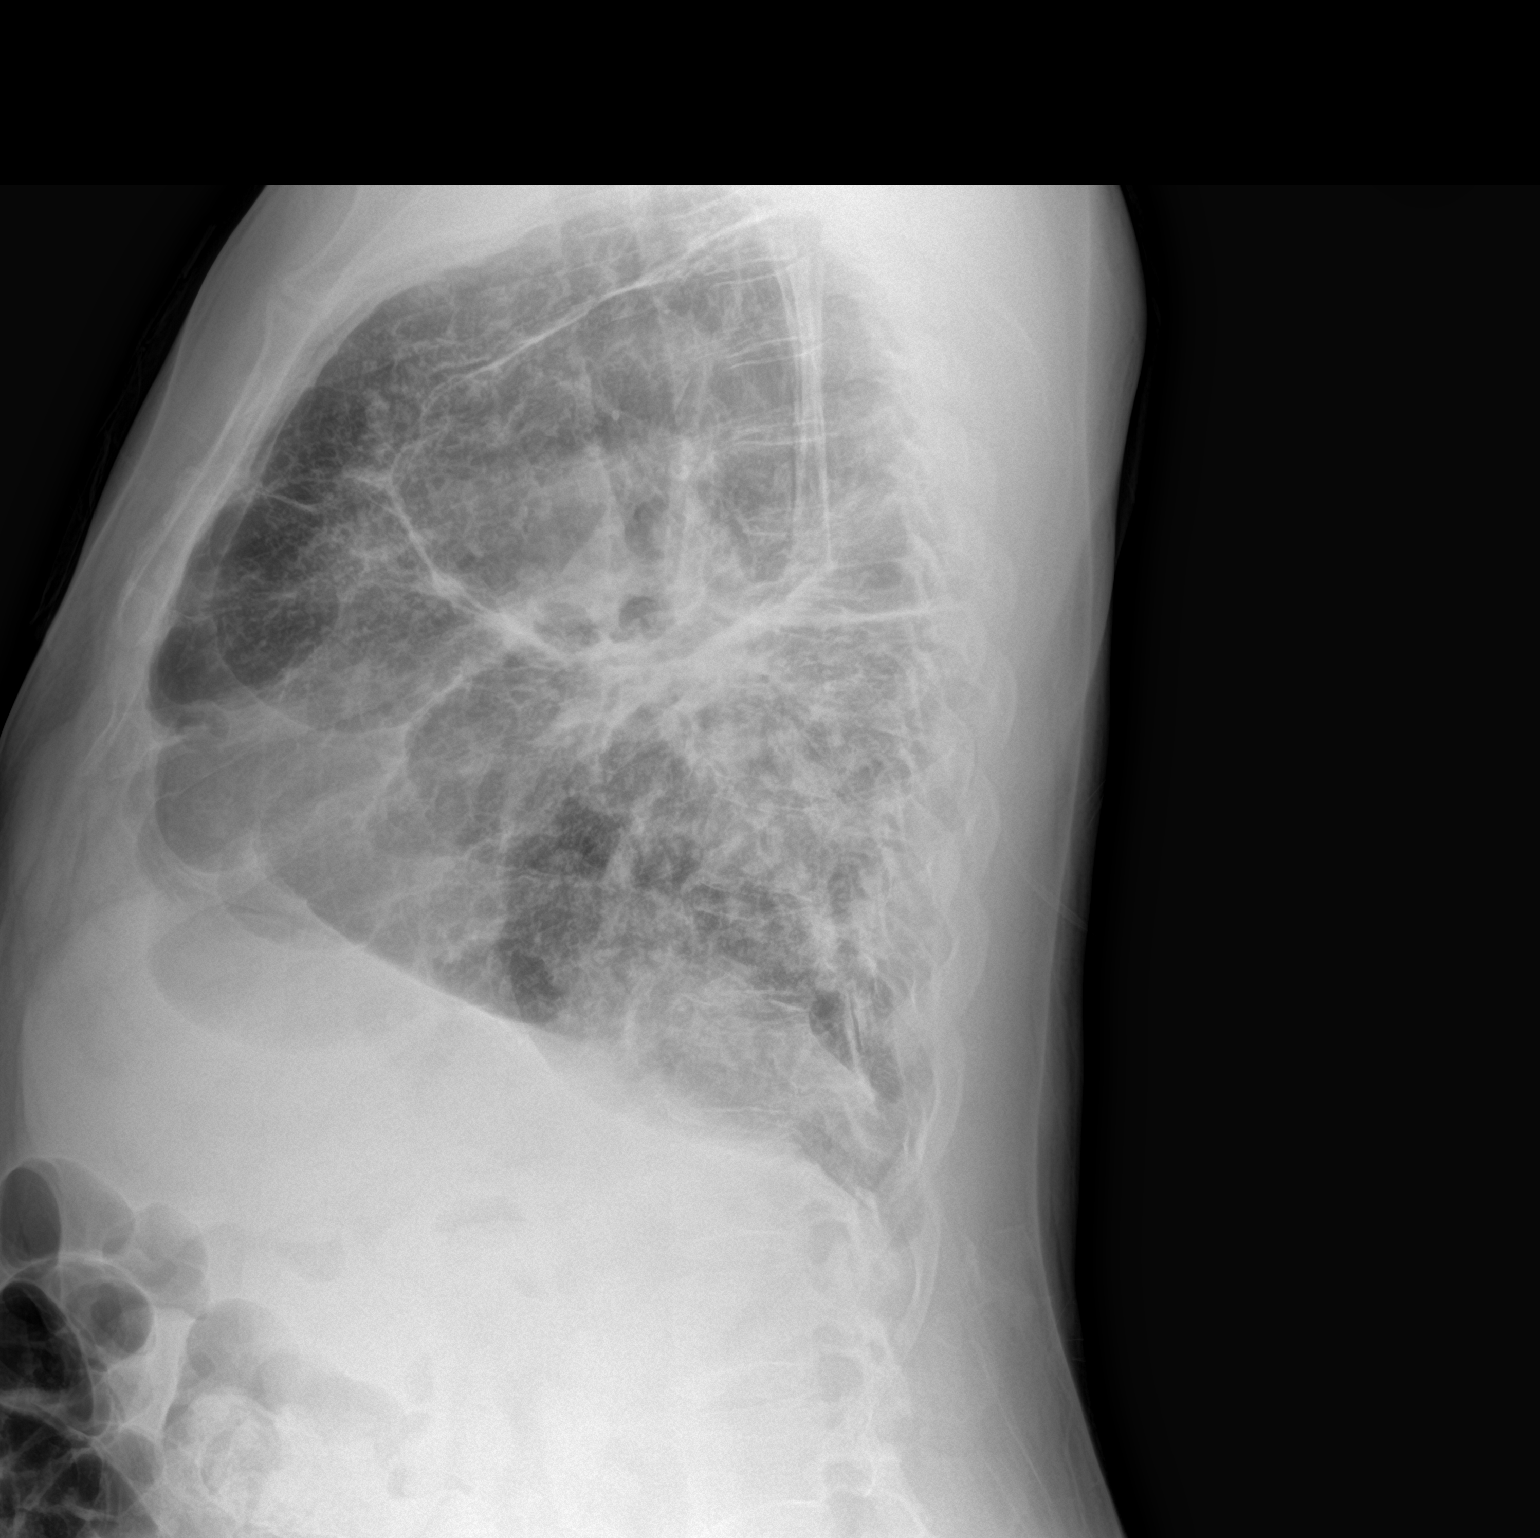

[2 of 2 positions shown; findings below may reference images not displayed]

FINDINGS: The lungs are well-aerated. Bilateral pleuroparenchymal scarring and
interstitial prominence are again noted, with focal airspace
opacities, reflecting changes of sarcoidosis. Previously noted left
basilar opacity has mildly improved, likely reflecting improving
pneumonia. There is no evidence of significant pleural effusion or
pneumothorax.

The heart is normal in size; the mediastinal contour is within
normal limits. No acute osseous abnormalities are seen.
IMPRESSION: Changes of sarcoidosis again noted. Previously noted left basilar
opacity has mildly improved, likely reflecting improving
superimposed pneumonia.

## 2017-07-16 IMAGING — CT CT ANGIO CHEST
2 of 7 series · 18 of 46 positions shown · IV contrast (APPLIED)
Comparison: 09/30/2014.

CLINICAL DATA: Sarcoid, oxygen dependent, acute respiratory
failure.

EXAM:
CT ANGIOGRAPHY CHEST WITH CONTRAST
TECHNIQUE: Multidetector CT imaging of the chest was performed using the
standard protocol during bolus administration of intravenous
contrast. Multiplanar CT image reconstructions and MIPs were
obtained to evaluate the vascular anatomy.
CONTRAST:  82 cc Isovue 370.

[Series 5: thins · axial · 0.77mm/px · z∈[-223,+60]mm · 16 of 317 slices shown]
[im 17/317  lung]
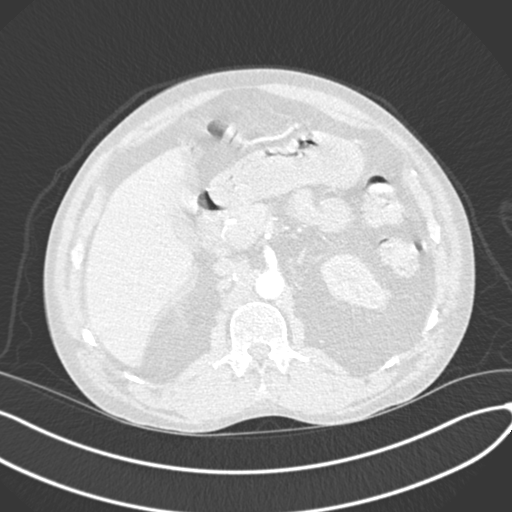
[im 34/317  soft-tissue]
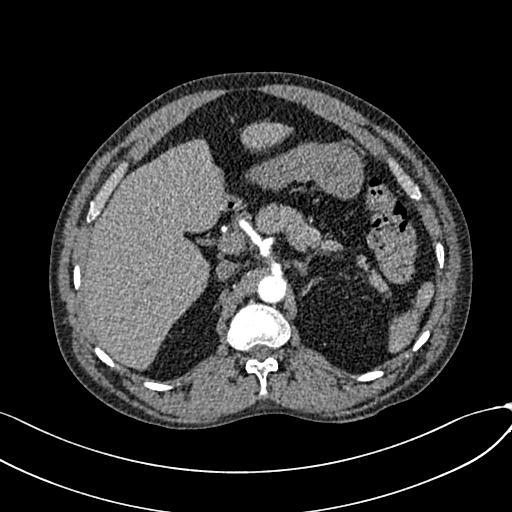
[im 50/317  lung]
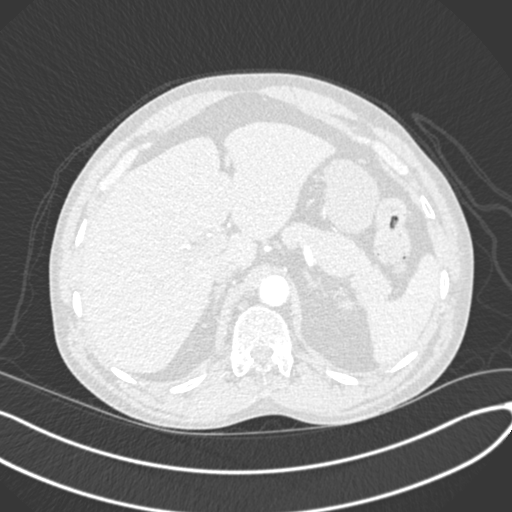
[im 67/317  soft-tissue]
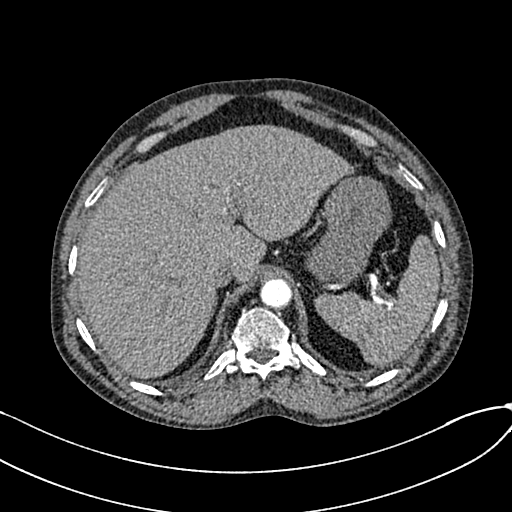
[im 100/317  lung]
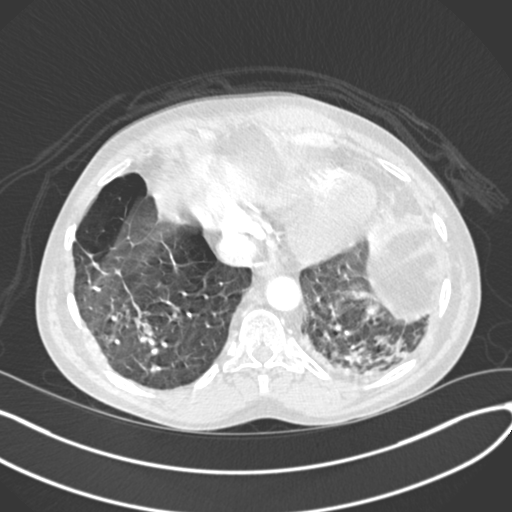
[im 117/317  soft-tissue]
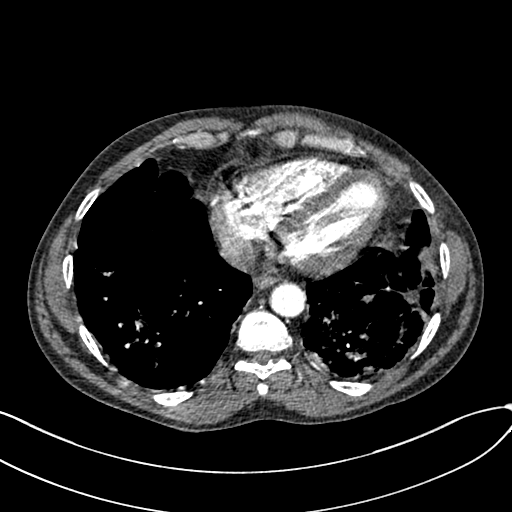
[im 134/317  lung]
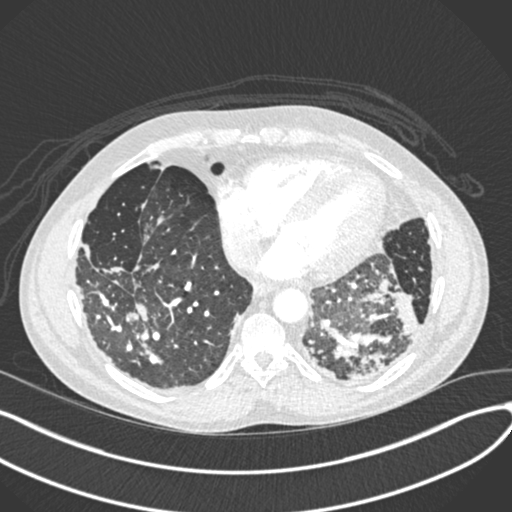
[im 150/317  soft-tissue]
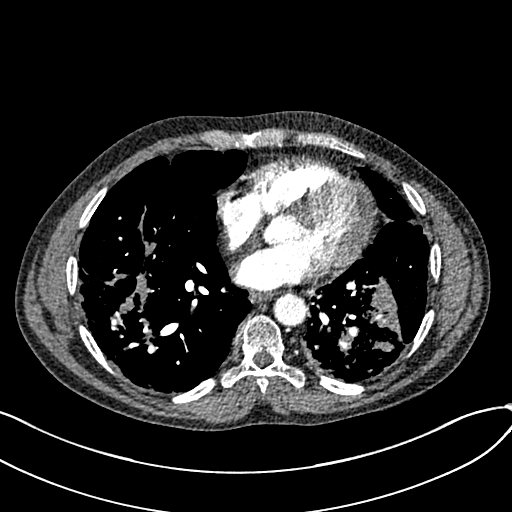
[im 167/317  lung]
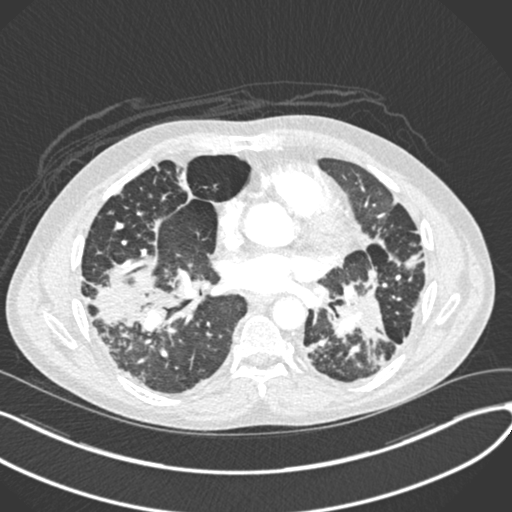
[im 183/317  soft-tissue]
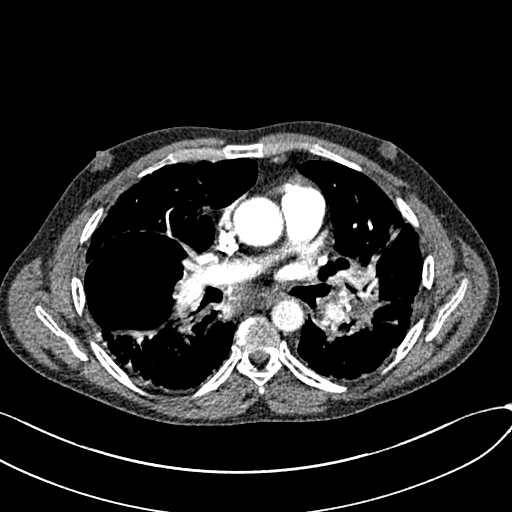
[im 200/317  lung]
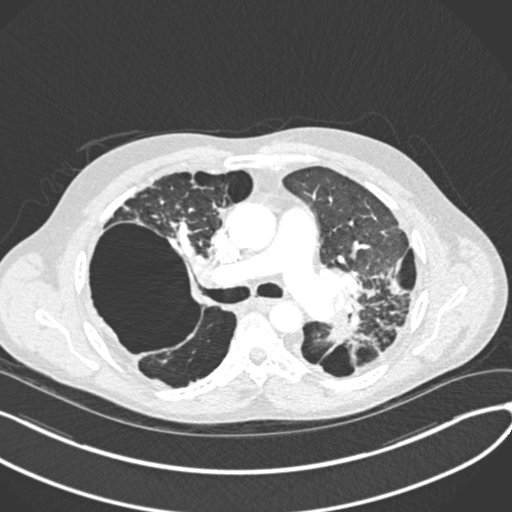
[im 217/317  soft-tissue]
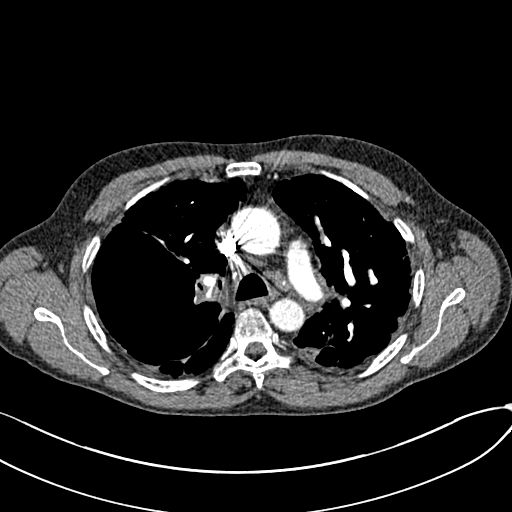
[im 250/317  lung]
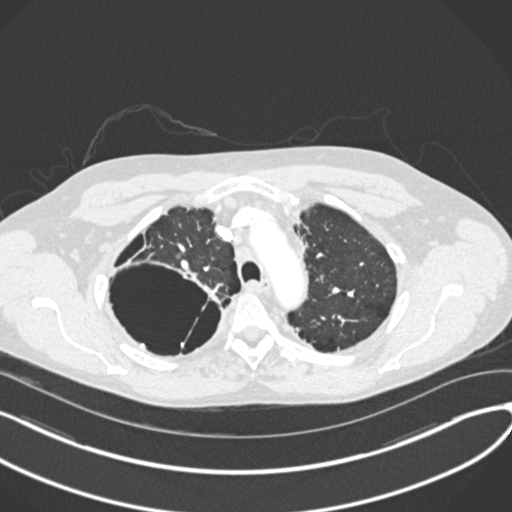
[im 267/317  soft-tissue]
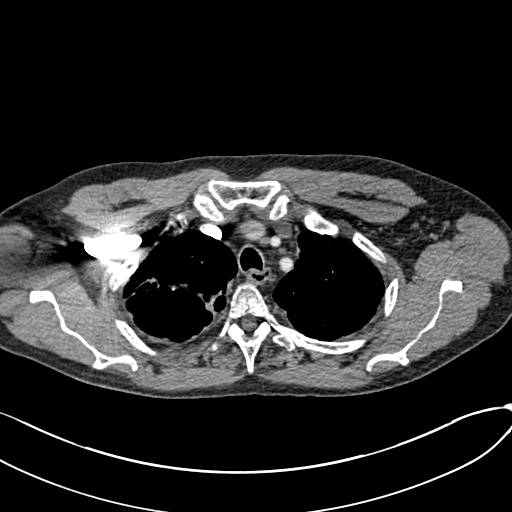
[im 283/317  lung]
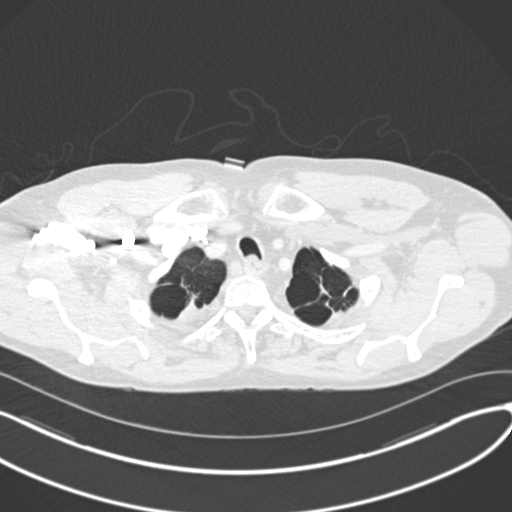
[im 300/317  soft-tissue]
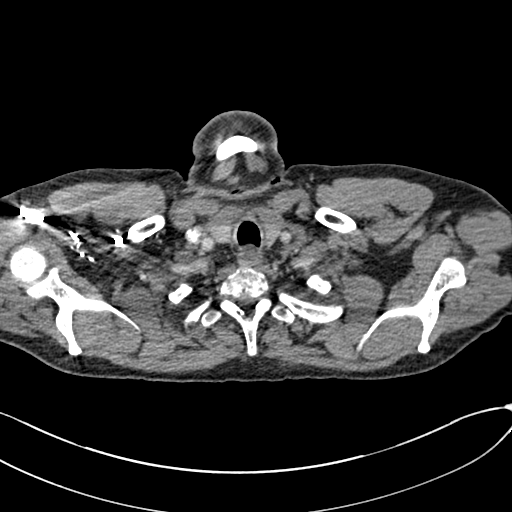

[Series 7: coronal mpr · coronal · 0.69mm/px · 2 of 87 slices shown]
[im 29/87  soft-tissue]
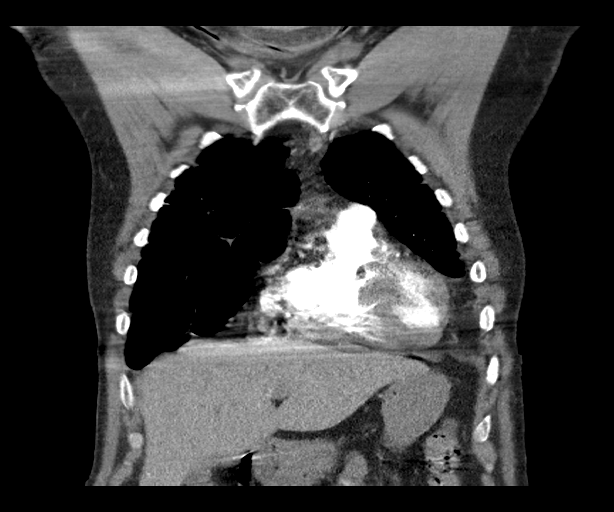
[im 58/87  soft-tissue]
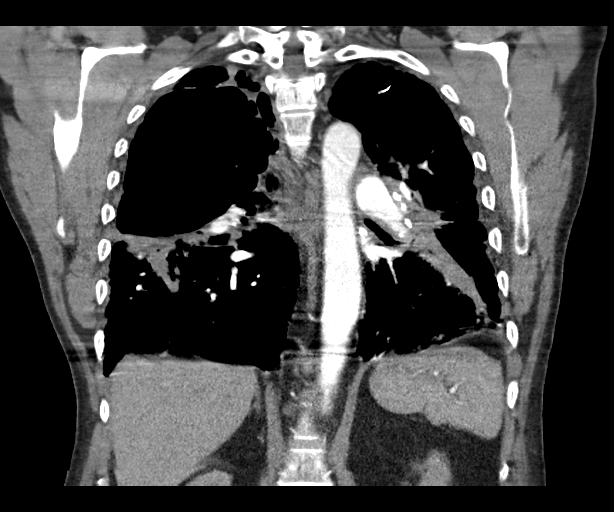

[18 of 46 positions shown; findings below may reference images not displayed]

FINDINGS: Cardiovascular: Image quality at the lung bases is degraded by
respiratory motion, limiting evaluation for filling defects in the
lobar, segmental and subsegmental pulmonary arteries. Otherwise, no
filling defects in the pulmonary arteries to indicate pulmonary
embolus. Heart is at the upper limits of normal in size. No
pericardial effusion.

Mediastinum/Nodes: There are calcified mediastinal and hilar lymph
nodes. No pathologically enlarged noncalcified mediastinal lymph
nodes. No axillary adenopathy. Esophagus is grossly unremarkable.

Lungs/Pleura: Biapical pleuroparenchymal scarring. There bullous
changes in the lungs bilaterally, right greater than left. Perihilar
soft tissue confluence, volume loss and surrounding
peribronchovascular nodularity bilaterally, similar. Image quality
is degraded by respiratory motion. No pleural fluid. Airway is
unremarkable.

Upper Abdomen: Visualized portions of the liver, gallbladder,
adrenal glands, kidneys, spleen, pancreas, stomach and bowel are
grossly unremarkable. Upper abdominal lymph nodes are subcentimeter
in short axis size.

Musculoskeletal: No worrisome lytic or sclerotic lesions.

Review of the MIP images confirms the above findings.
IMPRESSION: 1. Image quality at the lung bases is degraded by respiratory
motion, making evaluation for lobar, segmental and subsegmental
pulmonary emboli difficult. Otherwise, no evidence of pulmonary
embolus.
2. Mediastinal, hilar and pulmonary parenchymal changes of sarcoid,
similar.

## 2017-08-30 IMAGING — CR DG CHEST 2V
2 series · 2 of 2 positions shown · non-contrast
Comparison: 05/05/2016 CT, 05/04/2016 CXR

CLINICAL DATA: Bilateral lower leg swelling and erythema. History
of sarcoid, COPD.

EXAM:
CHEST  2 VIEW

[w chest lat]
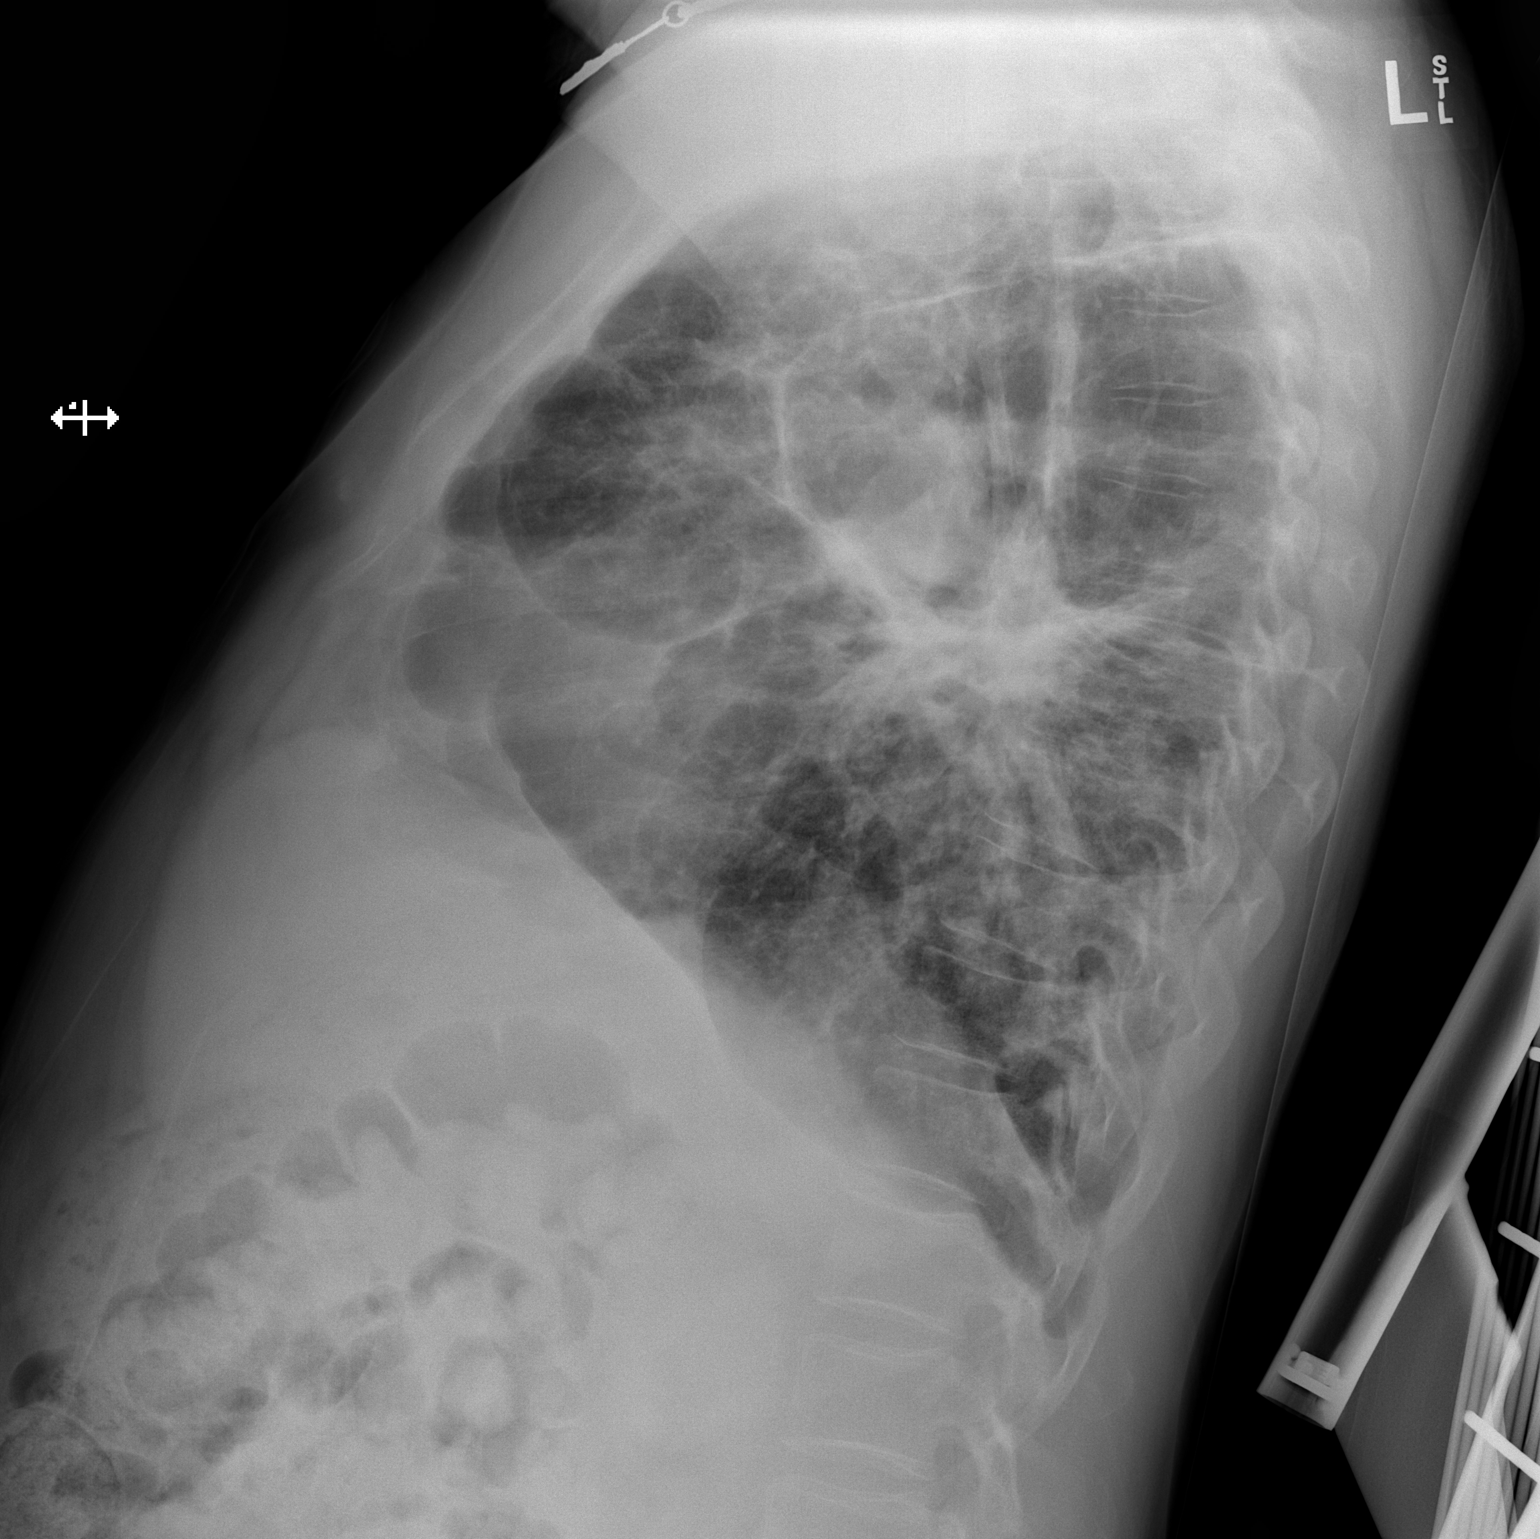

[x chest ap]
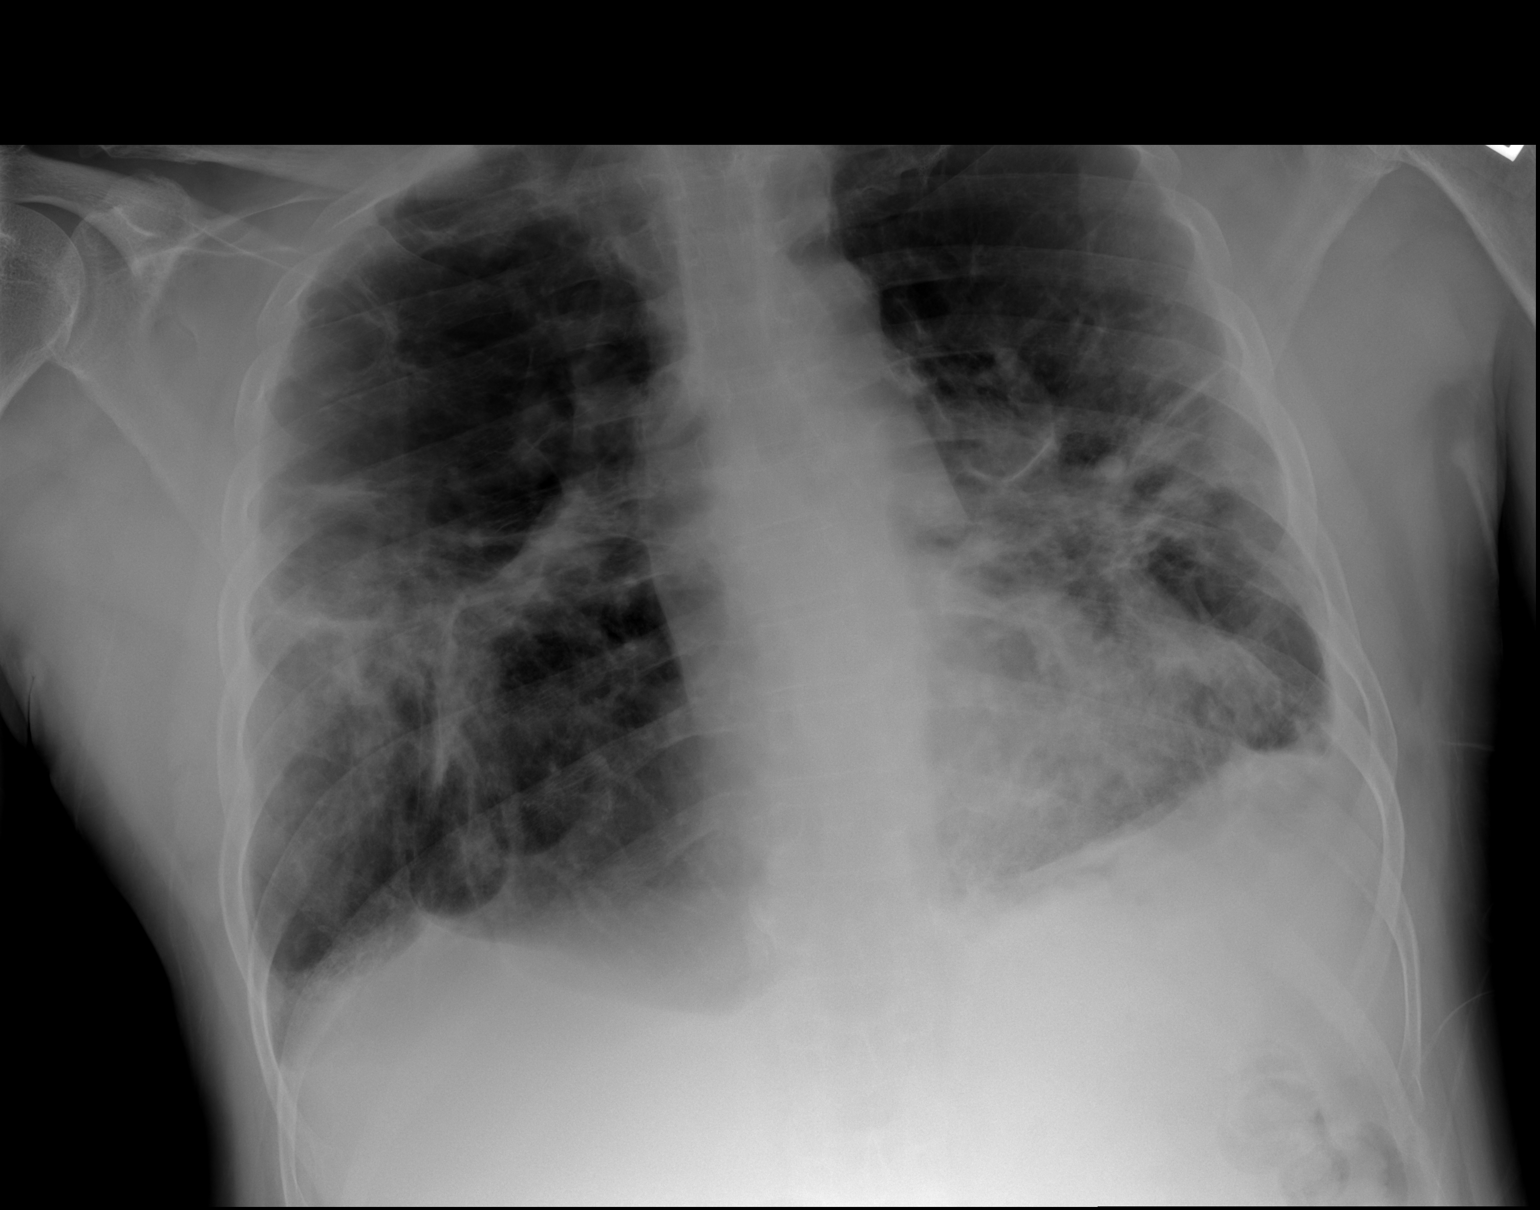

[2 of 2 positions shown; findings below may reference images not displayed]

FINDINGS: The heart size and mediastinal contours are within normal limits.
Bullous emphysematous changes are seen in the upper lobes right
greater than left with chronic pleuroparenchymal scarring about the
hila bilaterally. There does appear to be more vascular congestion
on current exam without pneumonic consolidation, effusion or
pneumothorax. The visualized skeletal structures are unremarkable.
IMPRESSION: 1. Upper lobe predominant emphysematous disease with bullous changes
right greater than left.
2. Slight interval increase in vascular congestion since previous
exam.
3. Chronic pleuroparenchymal scarring in the midlung bilaterally.

## 2017-12-22 IMAGING — CT CT ANGIO CHEST
2 of 7 series · 16 of 36 positions shown · IV contrast (ISOVUE 370)
Comparison: Chest CT 05/05/2016

CLINICAL DATA: Increased pleuritic chest pain. Hypoxia.
Sarcoidosis.

EXAM:
CT ANGIOGRAPHY CHEST WITH CONTRAST
TECHNIQUE: Multidetector CT imaging of the chest was performed using the
standard protocol during bolus administration of intravenous
contrast. Multiplanar CT image reconstructions and MIPs were
obtained to evaluate the vascular anatomy.
CONTRAST:  100 cc Isovue 370

[Series 6: thins for pacs · axial · 0.74mm/px · z∈[+1439,+1719]mm · 15 of 322 slices shown]
[im 21/322  lung]
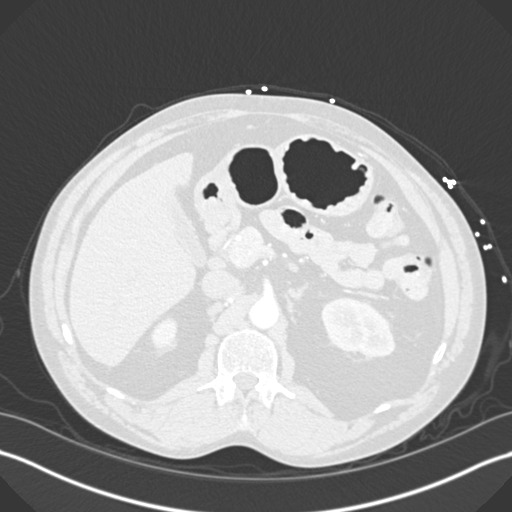
[im 41/322  mediastinal]
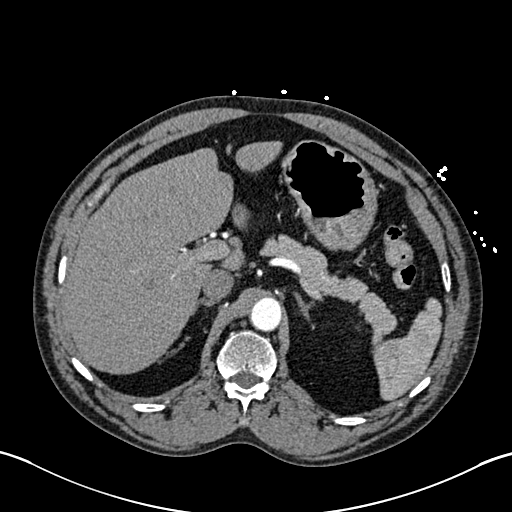
[im 61/322  lung]
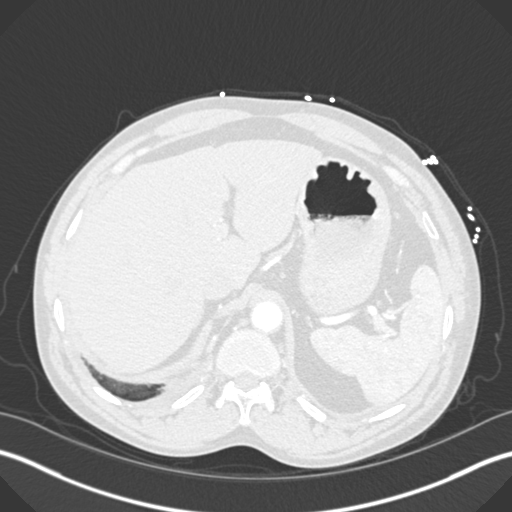
[im 81/322  mediastinal]
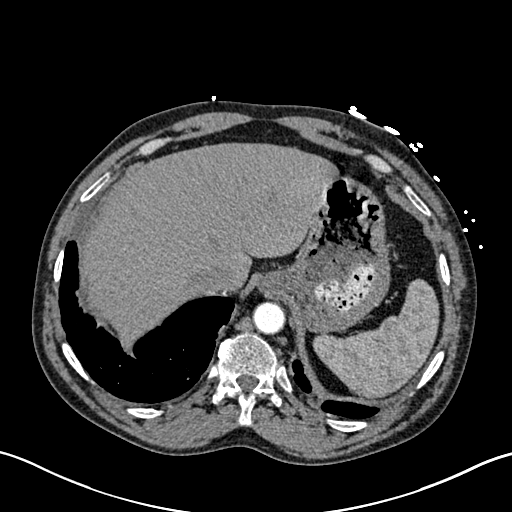
[im 101/322  lung]
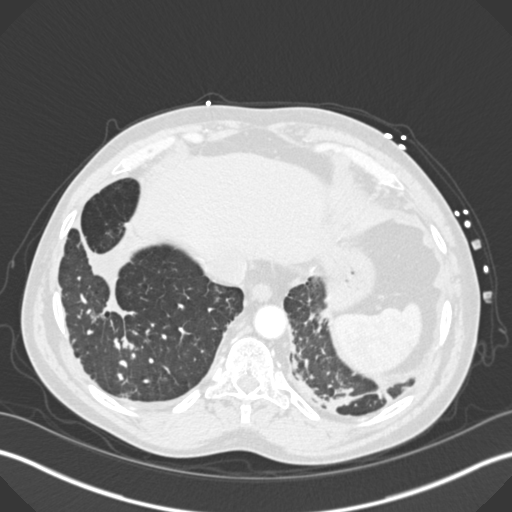
[im 121/322  mediastinal]
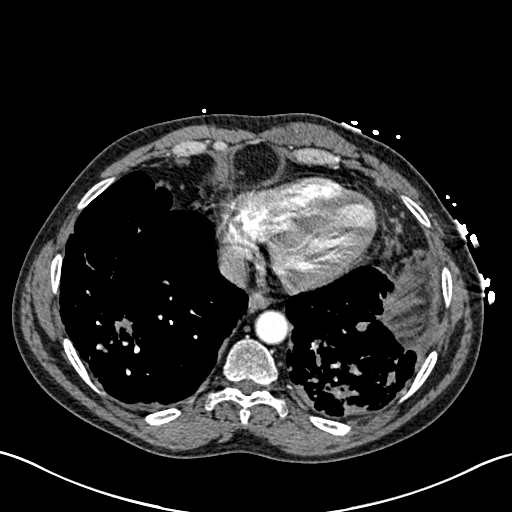
[im 141/322  lung]
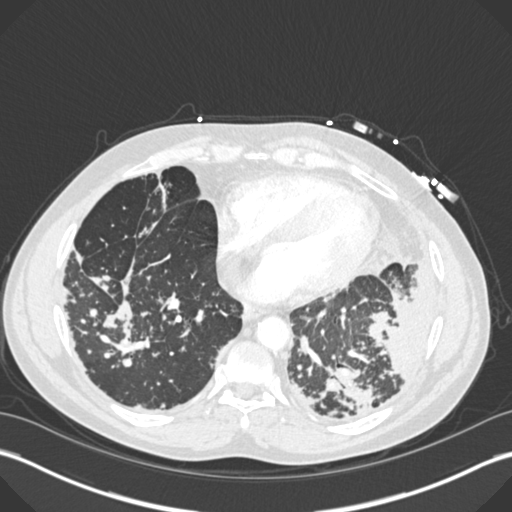
[im 161/322  mediastinal]
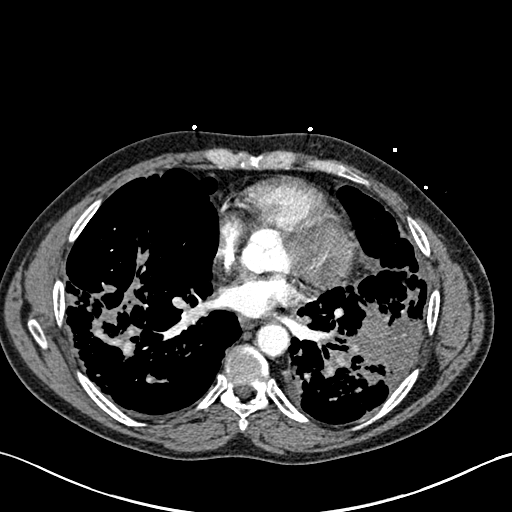
[im 181/322  lung]
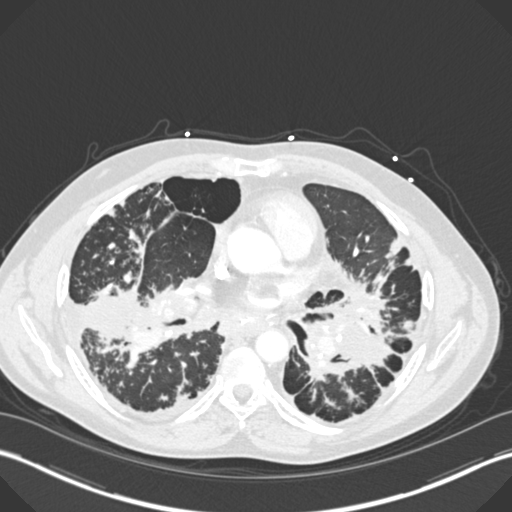
[im 201/322  mediastinal]
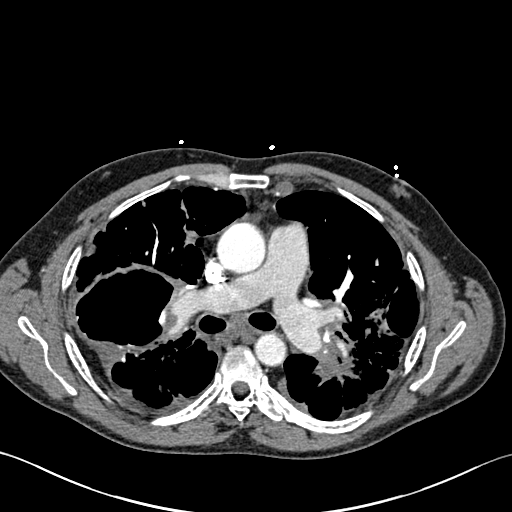
[im 221/322  lung]
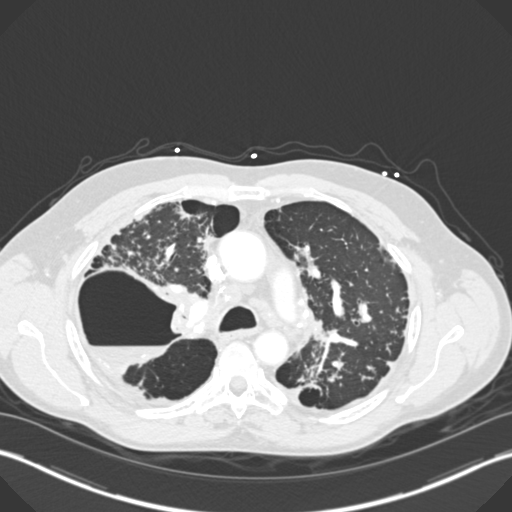
[im 241/322  mediastinal]
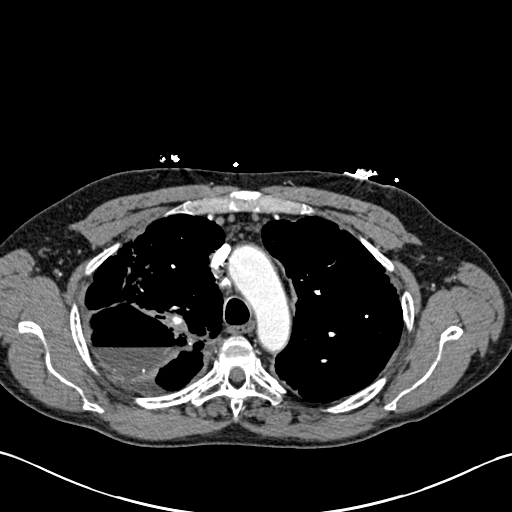
[im 261/322  lung]
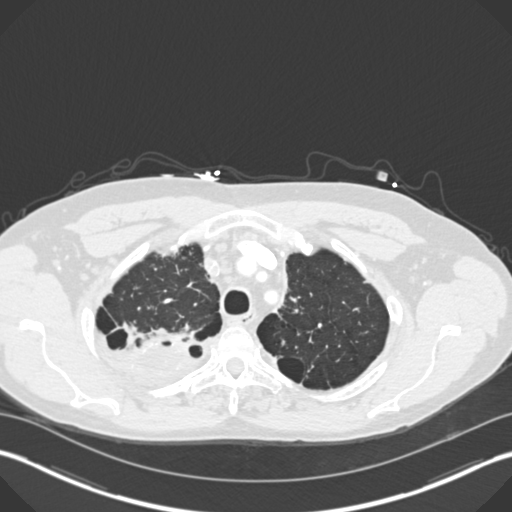
[im 281/322  mediastinal]
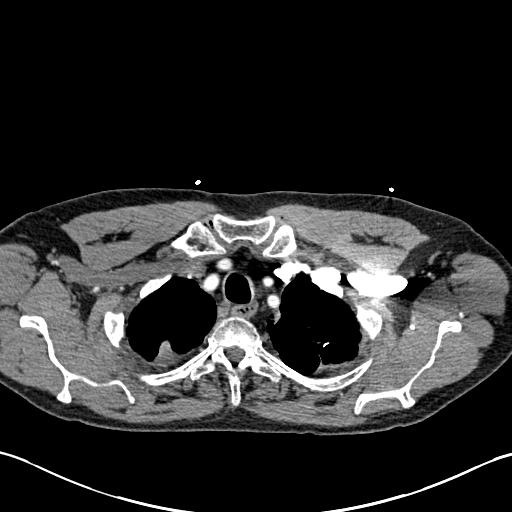
[im 301/322  lung]
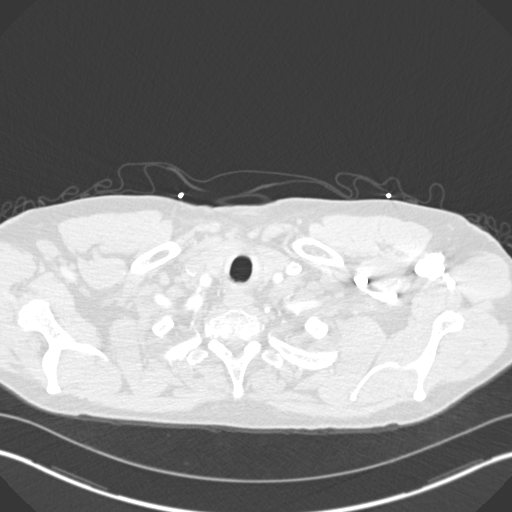

[Series 7: coronal mpr · coronal · 0.72mm/px · 1 of 124 slices shown]
[im 62/124  mediastinal]
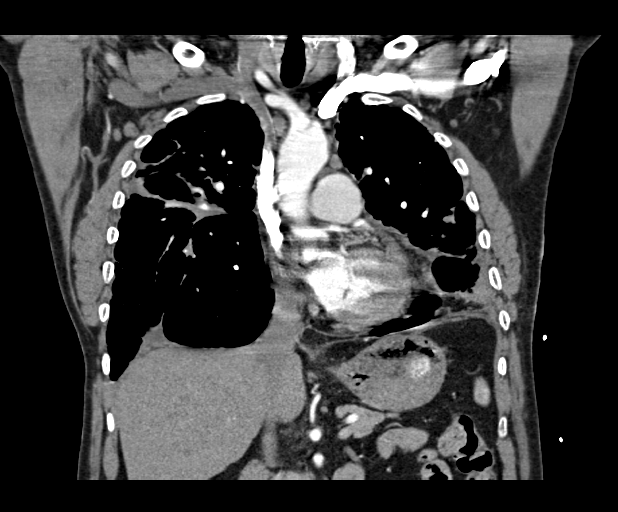

[16 of 36 positions shown; findings below may reference images not displayed]

FINDINGS: Cardiovascular: Suboptimal timing for pulmonary arterial contrast.
Contrast is much more dense in the aorta and pulmonary veins. The
however, the amount of pulmonary arterial contrast is adequate if
not ideal. A second contrast 0 since second radiation dose are not
thought to be worthwhile.

No acute pulmonary embolus. Chronic truncation of the posterior
right upper lobe pulmonary arterial branch, likely due to
vasoconstriction from hypoaeration related to the large cavitary
process, no change from 05/05/2016.

No acute aortic findings. Left ventricular hypertrophy, septal
thickness 1.9 cm.

Mild atherosclerotic calcification of the thoracic aorta.

Mediastinum/Nodes: Scattered paratracheal, hilar, and subcarinal
adenopathy with scattered prevascular and pericardial lymph nodes
overall similar to prior. Many of these lymph nodes are calcified,
compatible with sarcoidosis. Subcarinal node 1.9 cm, previously
cm.

Lungs/Pleura: Severe architectural distortion and scattered
reticulonodular interstitial opacities. Severe airway thickening in
the right upper lobe common nearly occluded, with a large cavitary
process posteriorly in the right upper lobe containing an air-fluid
level. The cavitary process is smaller but the air- fluid level is
new.

Extensive reticulonodular opacities in both lower lobes. Confluent
airspace opacities in the left perihilar region with severe proximal
bronchial narrowing in the upper lobe. Peribronchovascular
nodularity and airway narrowing in the superior segment right lower
lobe and in the right middle lobe as well. This airway narrowing is
generally similar to prior exam from [place]. The left lower lobe
airspace opacity is increased significantly in the periphery of the
lung, and superimposed pneumonia is a distinct possibility in the
left lower lobe.

Severe emphysema.

Upper Abdomen: Stable upper abdominal lymph nodes.

Musculoskeletal: Unremarkable

Review of the MIP images confirms the above findings.
IMPRESSION: 1. No pulmonary embolus identified. Reduced sensitivity for small
emboli due to non ideal bolus timing.
2. Severe architectural distortion in both lungs with extensive
findings of sarcoidosis superimposed on emphysema. There is also
extensive increase in airspace opacity in the left lower lobe
suspicious for superimposed acute pneumonia.
3. The large cavitary lesion posteriorly in the right upper lobe is
reduced in size but has a new air-fluid level -this was previously
completely air-filled.
4. Scattered adenopathy characteristic of sarcoidosis.
5. Left ventricular hypertrophy.
6. Prominent bilateral airway narrowing, similar to the April 2016
exam.
7. Aortic Atherosclerosis ([id number]) and Emphysema ([id number]).

## 2017-12-22 IMAGING — CR DG CHEST 2V
2 series · 2 of 2 positions shown · non-contrast
Comparison: June 19, 2016

CLINICAL DATA: History of sarcoidosis. Chronic decreased oxygen
saturation. Chest pain.

EXAM:
CHEST  2 VIEW

[w chest lat]
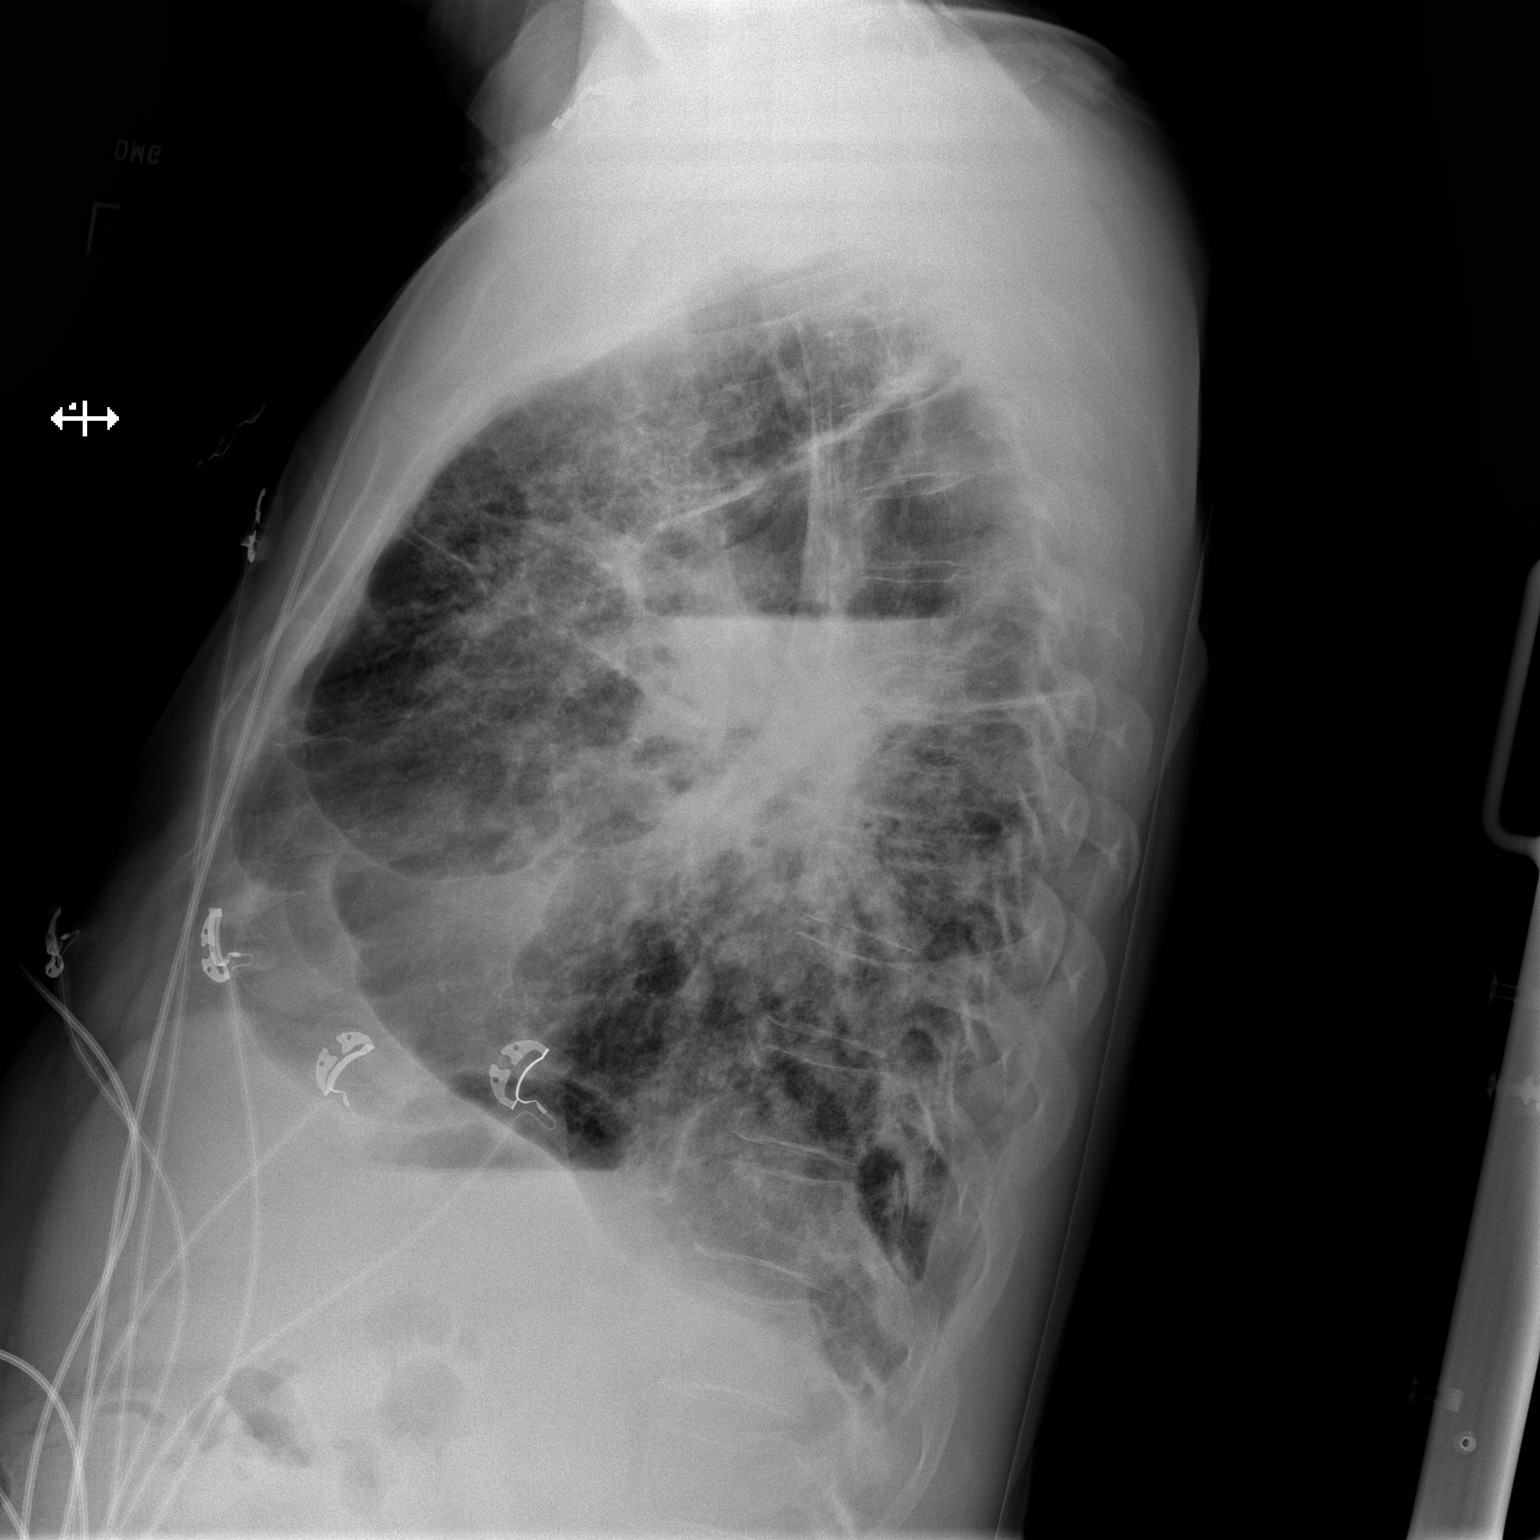

[x chest ap]
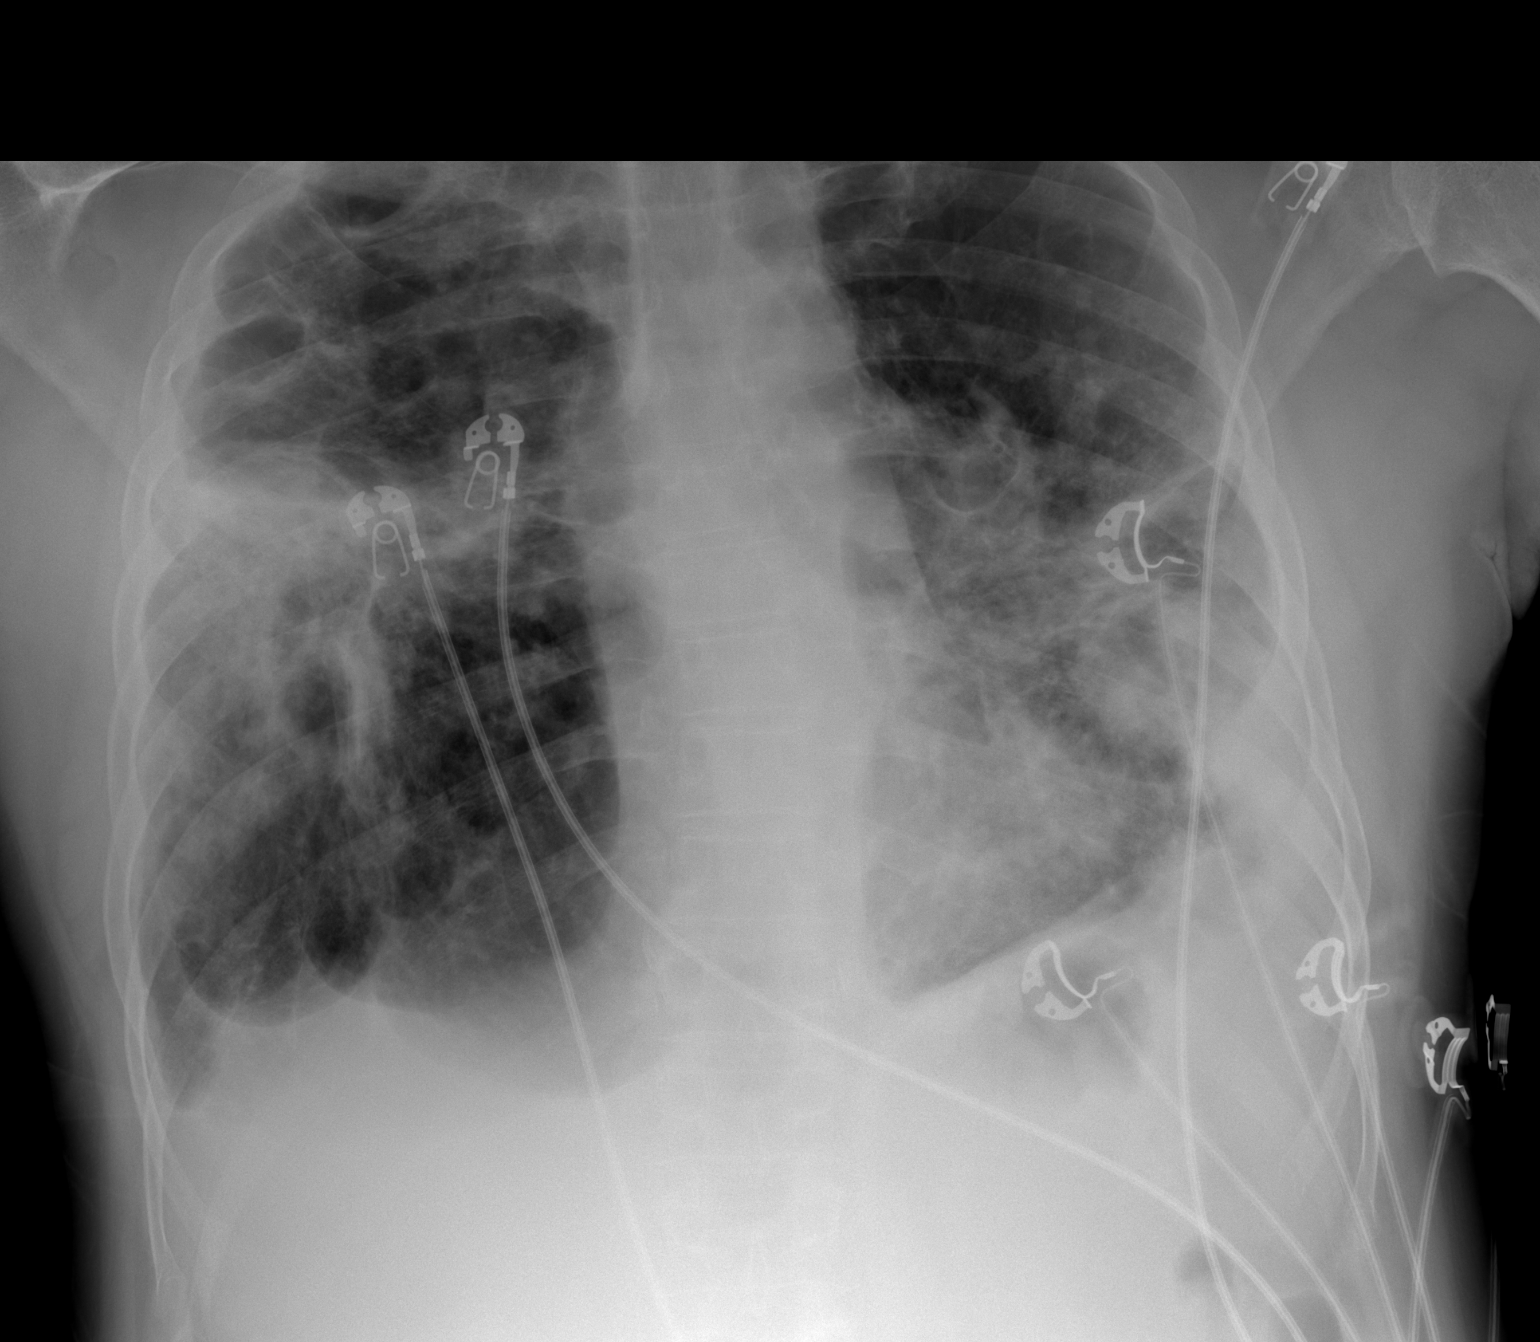

[2 of 2 positions shown; findings below may reference images not displayed]

FINDINGS: There is underlying scarring and bullous disease. There is increased
opacity in the left lower lobe and right mid lung regions compared
to most recent study. There is an apparent air-fluid level within a
bulla in the posterior segment of the right upper lobe.

There is chronic blunting of the costophrenic angles, likely due to
scarring.

Heart size is normal. There is chronic distortion of pulmonary
vascularity, stable. No adenopathy is evident. No bone lesions.
IMPRESSION: Areas of scarring and bullous disease, stable. There is now an
air-fluid level in a posterior segment right upper lobe bulla,
raising concern for infected bulla in this area. There is also
airspace consolidation in the left lower lobe with an increase in
opacity in this area compared to most recent study.

Stable cardiac silhouette.  No adenopathy evident.

## 2018-04-16 IMAGING — DX DG CHEST 2V
2 series · 2 of 2 positions shown · non-contrast
Comparison: 10/11/2016

CLINICAL DATA: Chronic cough and shortness of breath increasing
over the past few weeks, history of sarcoid

EXAM:
CHEST  2 VIEW

[chest pa]
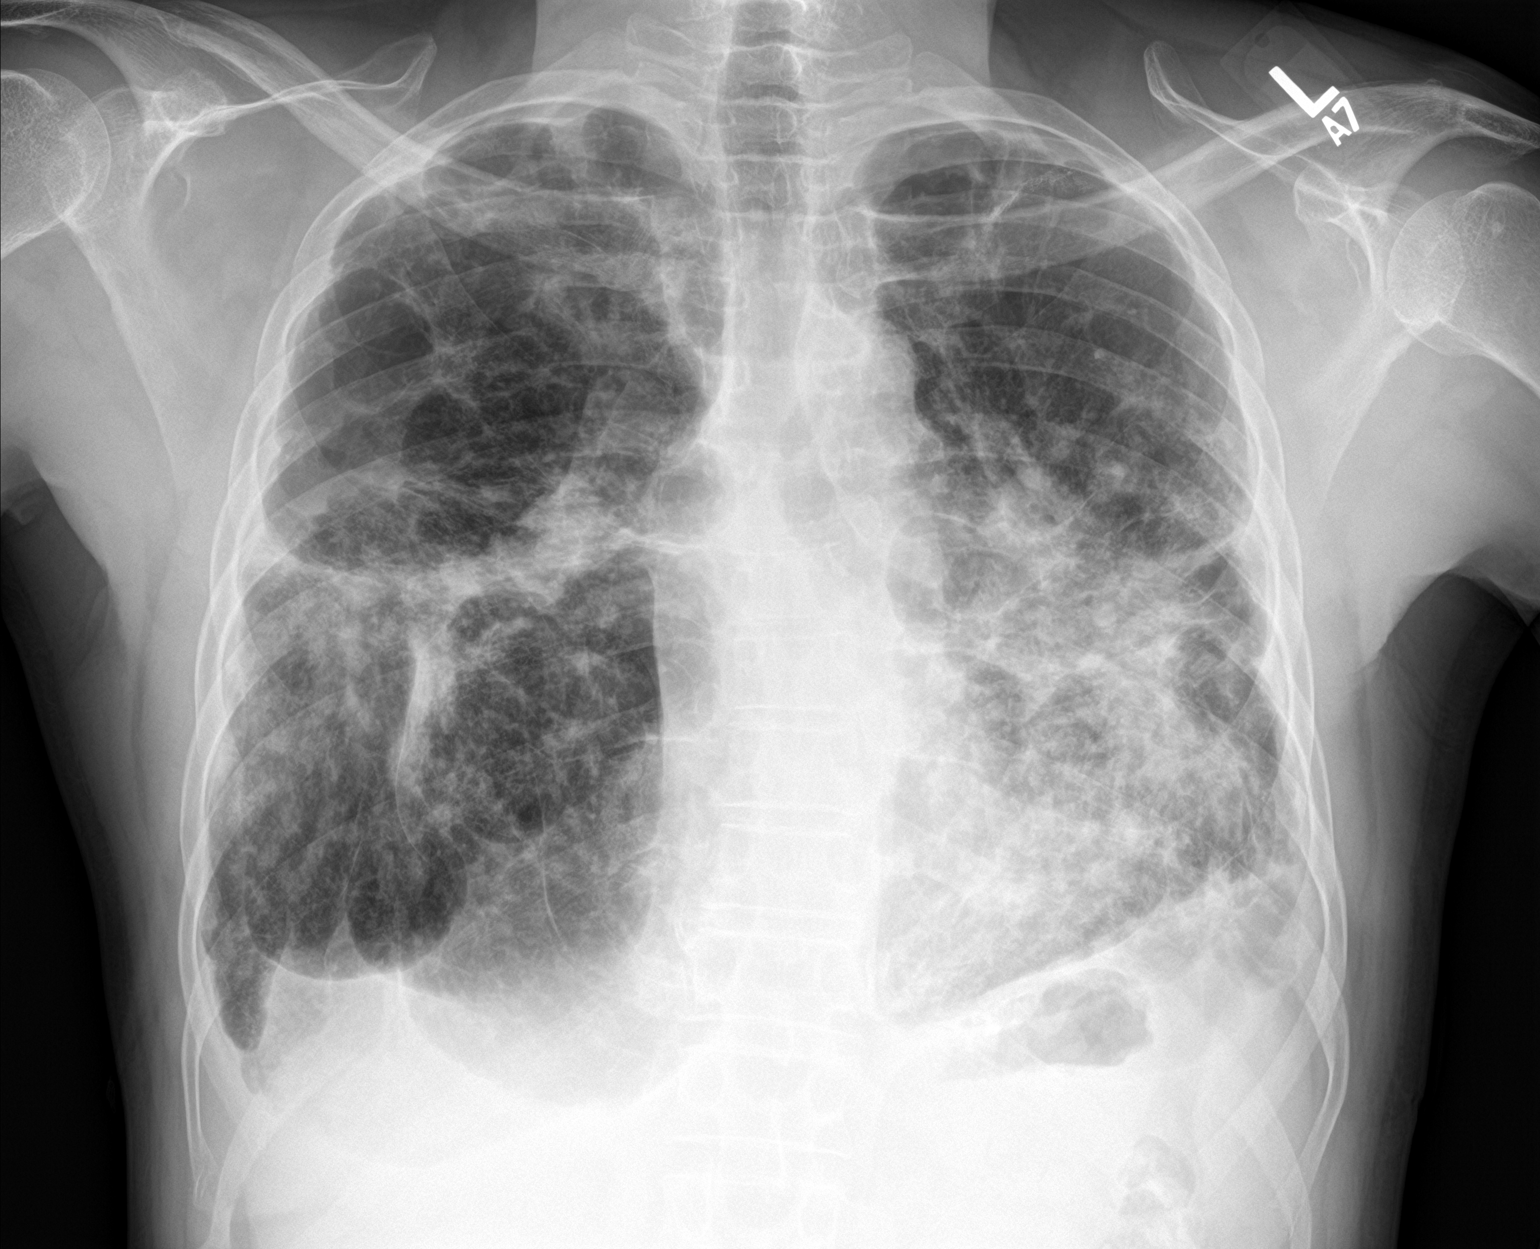

[chest lat]
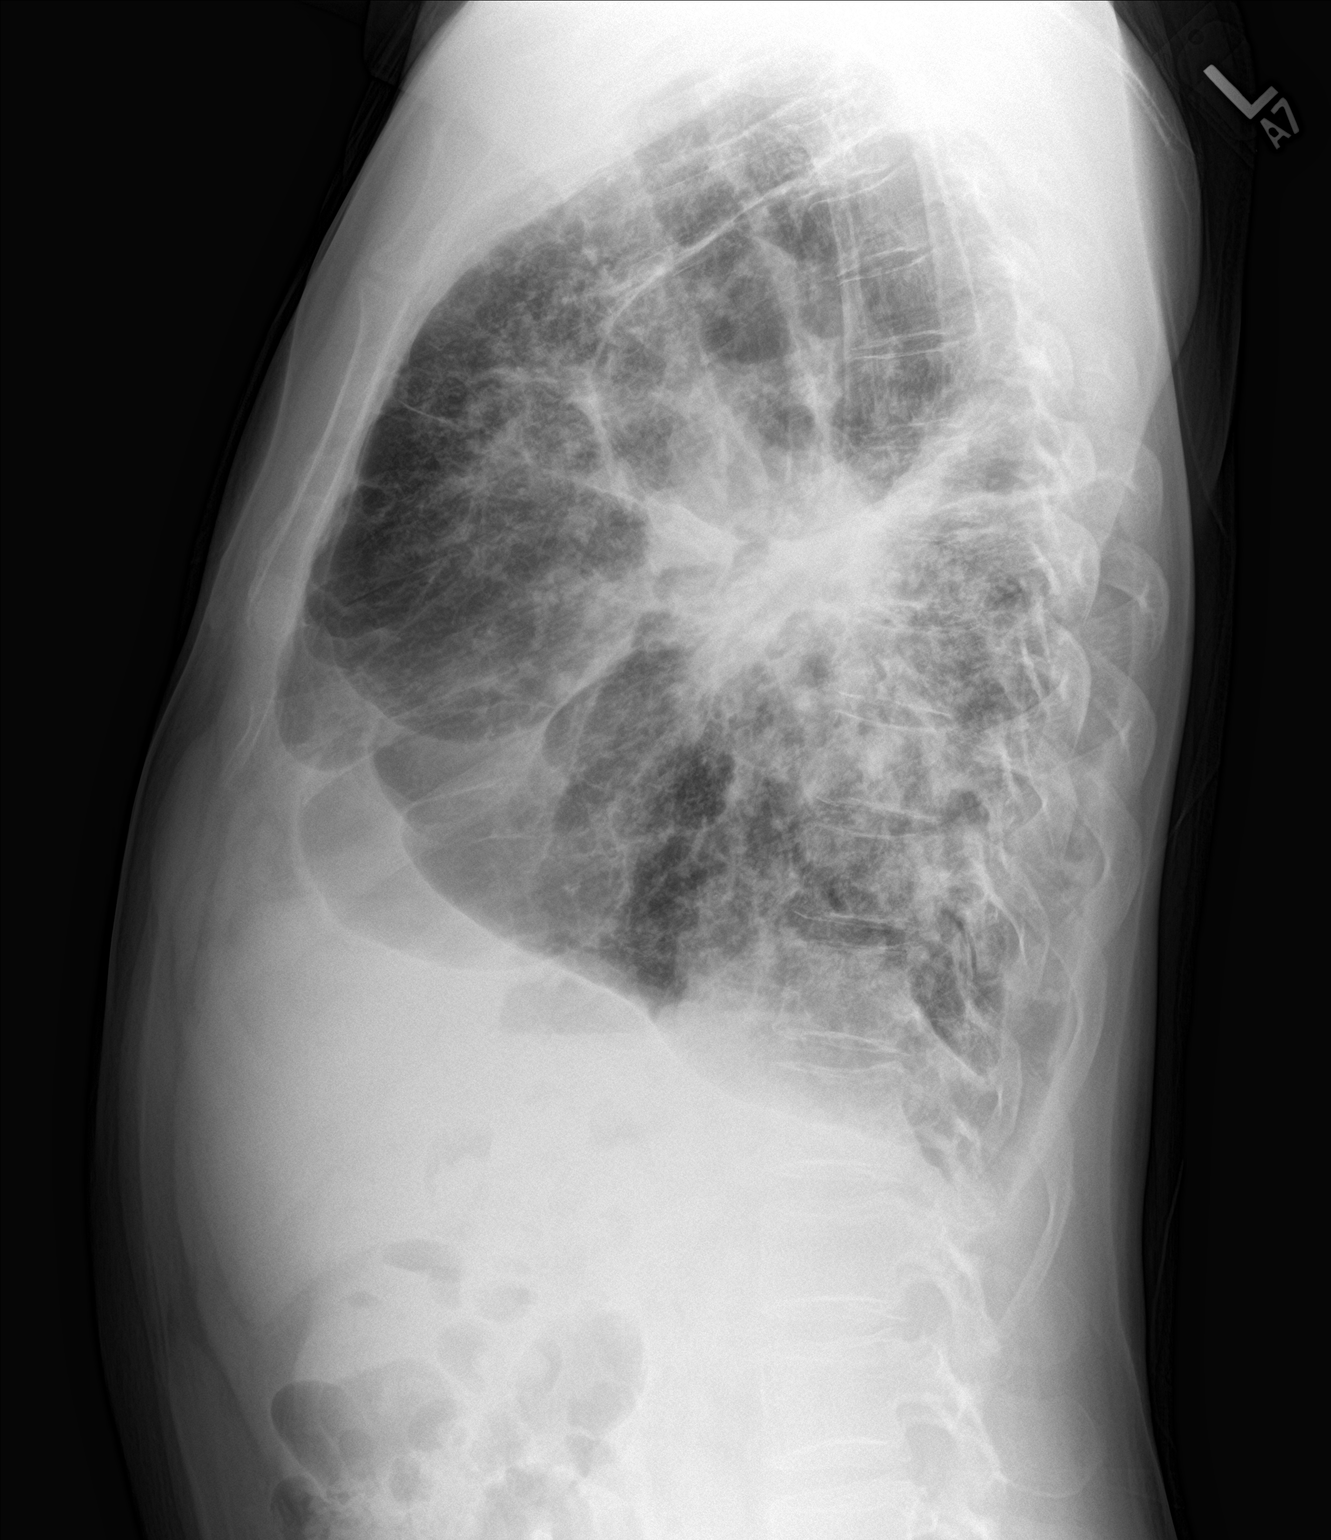

[2 of 2 positions shown; findings below may reference images not displayed]

FINDINGS: Cardiac shadow is stable. Diffuse fibrotic changes are again
identified throughout both lungs. The overall appearance is stable
from the prior CT examination. No new focal abnormality is seen. No
bony abnormality is noted.
IMPRESSION: Stable chronic changes consistent with the given clinical history.
No significant change from the prior CT is noted.

## 2018-05-21 IMAGING — DX DG CHEST 2V
2 series · 2 of 2 positions shown · non-contrast
Comparison: 02/03/2017, [DATE], 11/24/2015.  CT 10/11/2016.

CLINICAL DATA: Shortness of breath.

EXAM:
CHEST  2 VIEW

[chest pa]
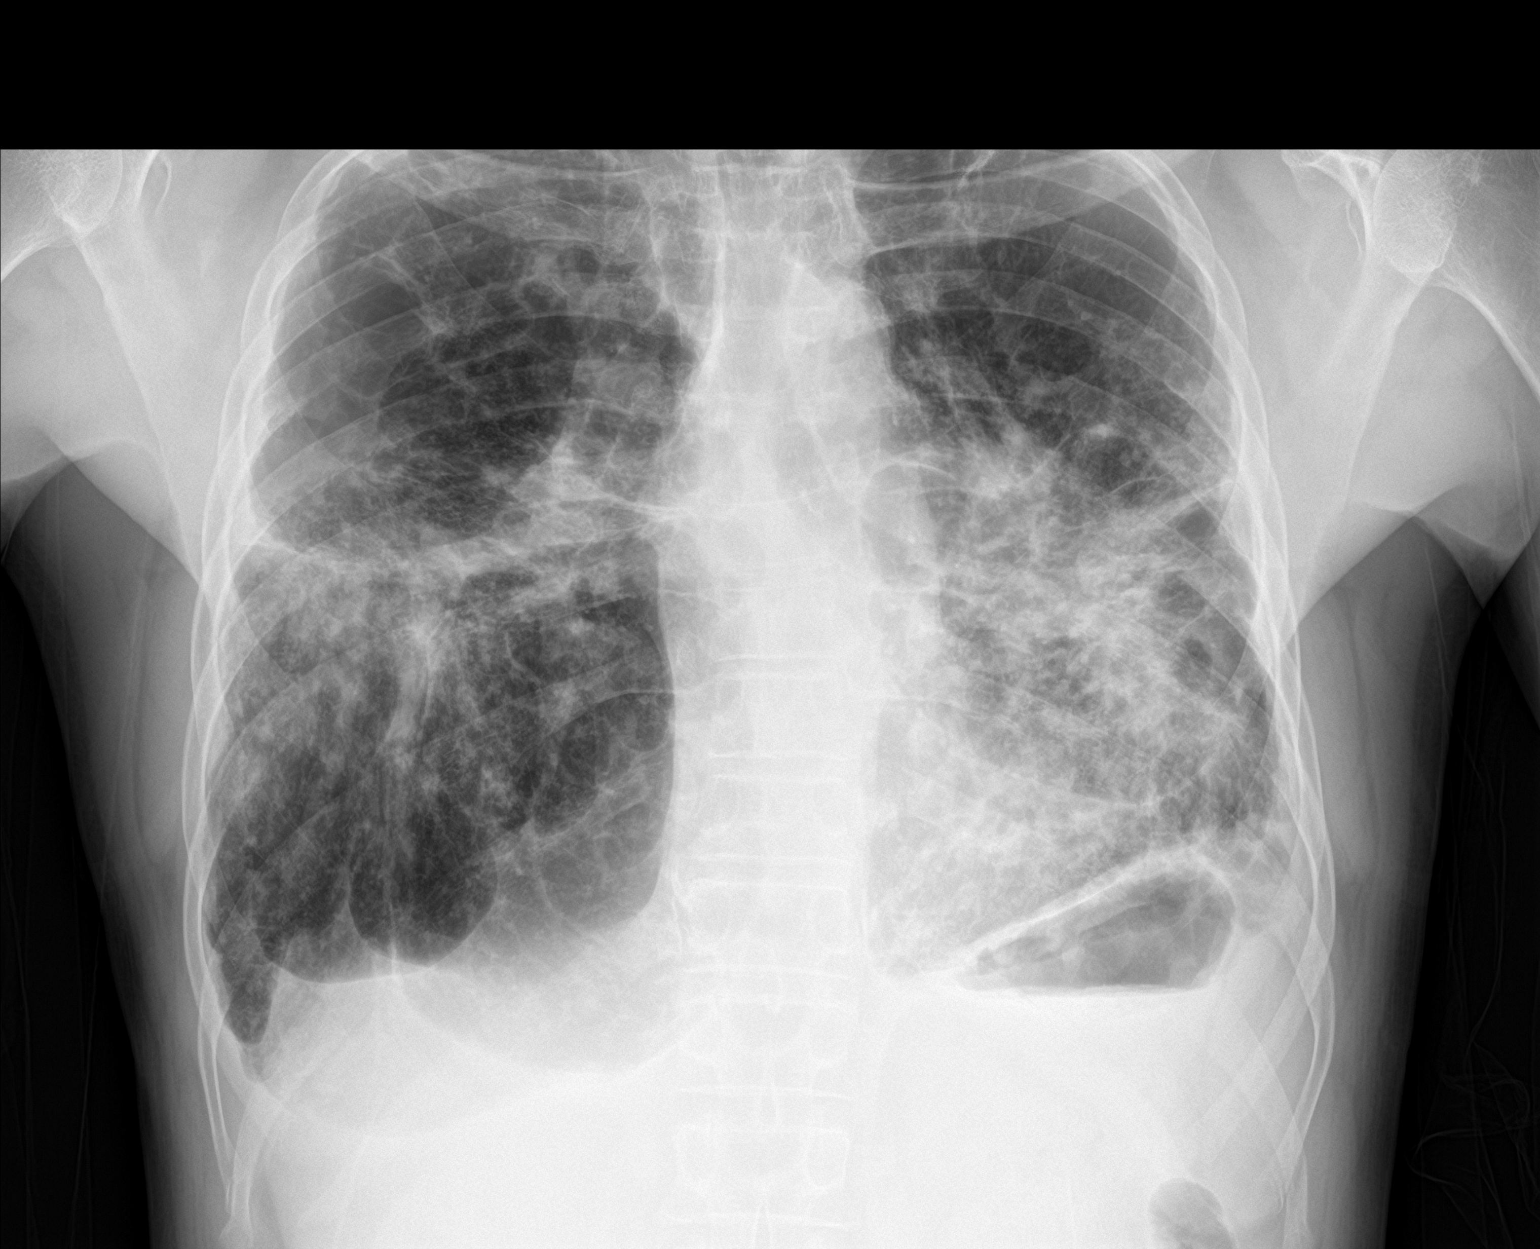

[chest lat]
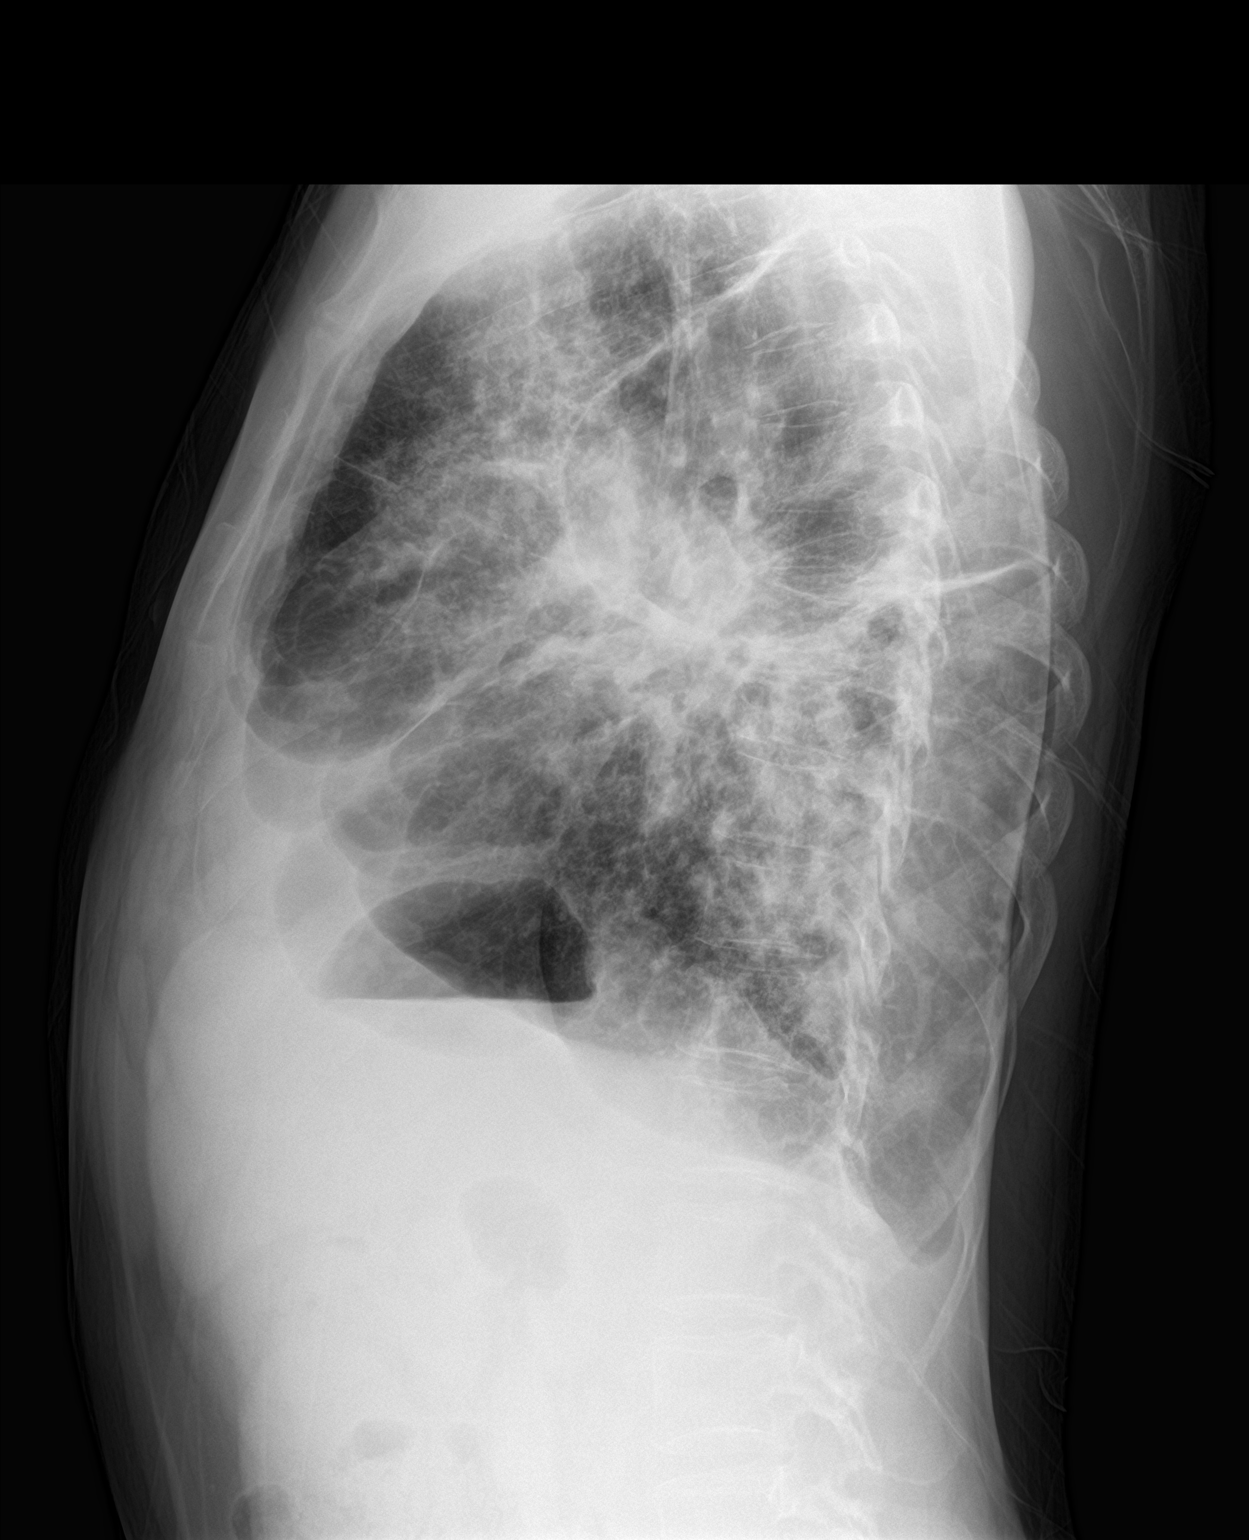

[2 of 2 positions shown; findings below may reference images not displayed]

FINDINGS: Heart size normal. Diffuse severe bilateral interstitial prominence
consistent chronic interstitial lung disease. Diffuse bilateral
pleural-parenchymal thickening consistent with scarring. No acute
abnormality. No pneumothorax. No acute bony abnormality

No
IMPRESSION: Severe chronic interstitial lung disease and bilateral
pleural-parenchymal scarring. Exam stable from prior exams.

## 2018-06-24 IMAGING — CT CT ANGIO CHEST
2 of 6 series · 18 of 46 positions shown · IV contrast (ISOVUE)
Comparison: 10/11/2016

CLINICAL DATA: High pretest probability of pulmonary embolism.

EXAM:
CT ANGIOGRAPHY CHEST WITH CONTRAST
TECHNIQUE: Multidetector CT imaging of the chest was performed using the
standard protocol during bolus administration of intravenous
contrast. Multiplanar CT image reconstructions and MIPs were
obtained to evaluate the vascular anatomy.
CONTRAST:  100mL VQG0R6-GPO IOPAMIDOL (VQG0R6-GPO) INJECTION 76%

[Series 5: thins · axial · 0.70mm/px · z∈[-296,-47]mm · 15 of 273 slices shown]
[im 12/273  lung]
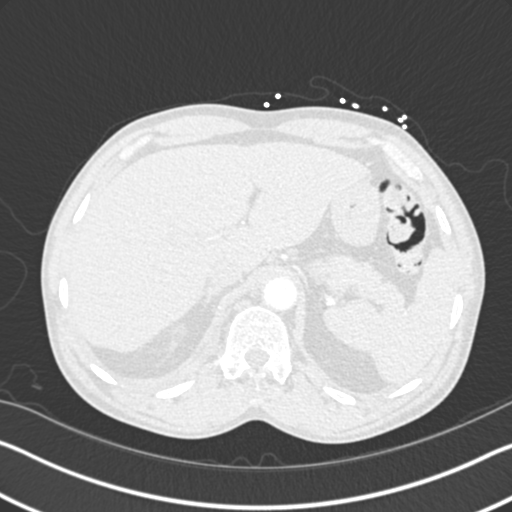
[im 36/273  soft-tissue]
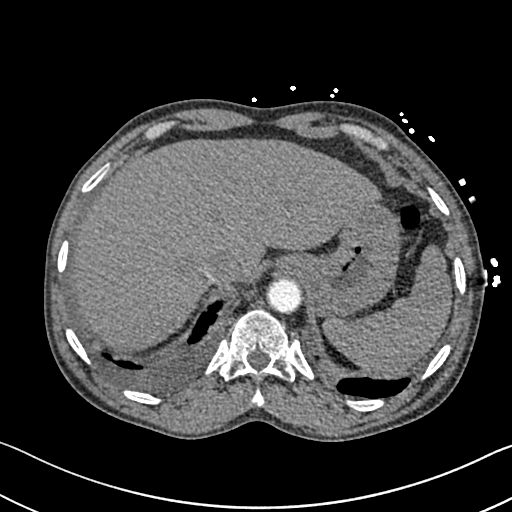
[im 48/273  lung]
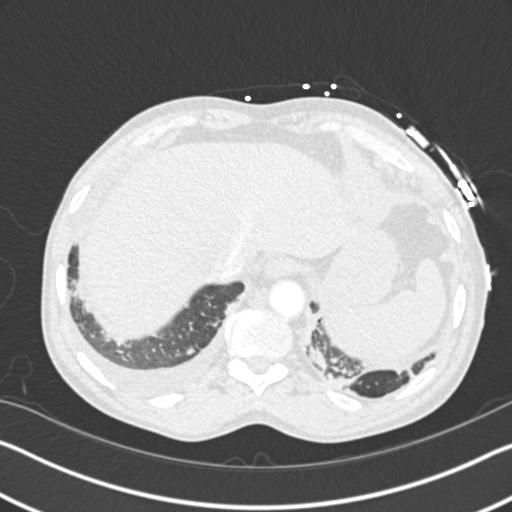
[im 71/273  soft-tissue]
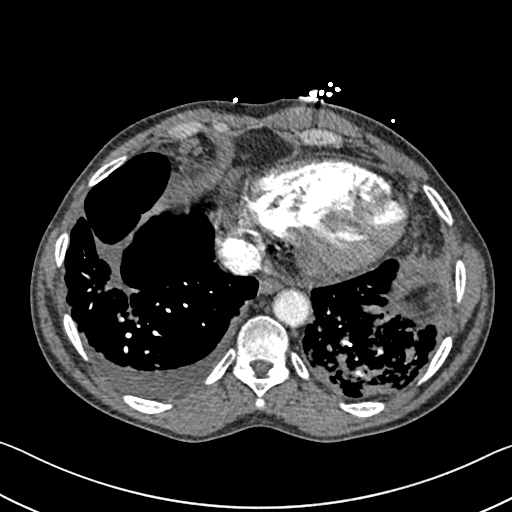
[im 83/273  lung]
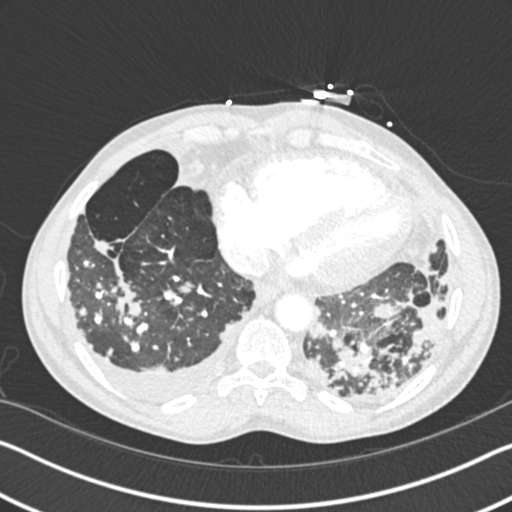
[im 107/273  soft-tissue]
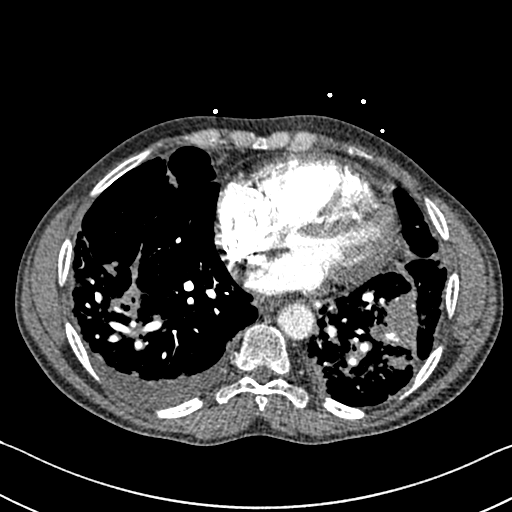
[im 119/273  lung]
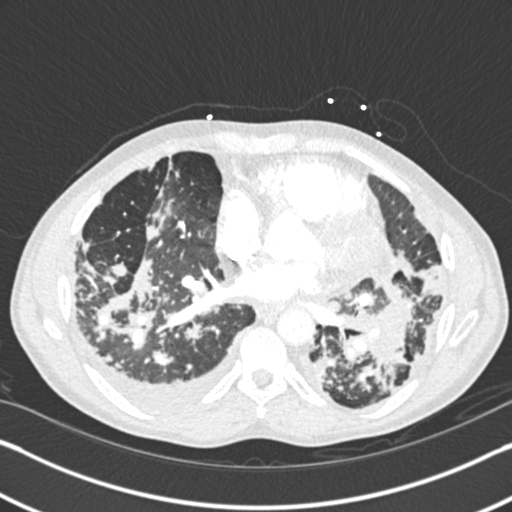
[im 142/273  soft-tissue]
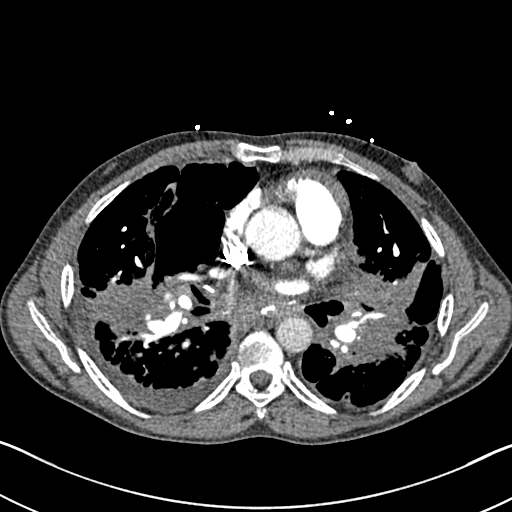
[im 154/273  lung]
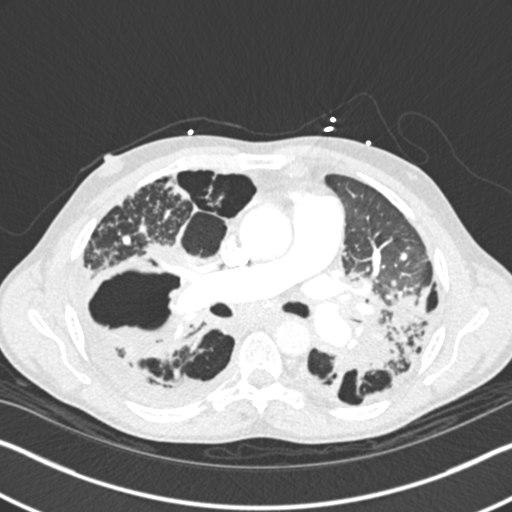
[im 166/273  soft-tissue]
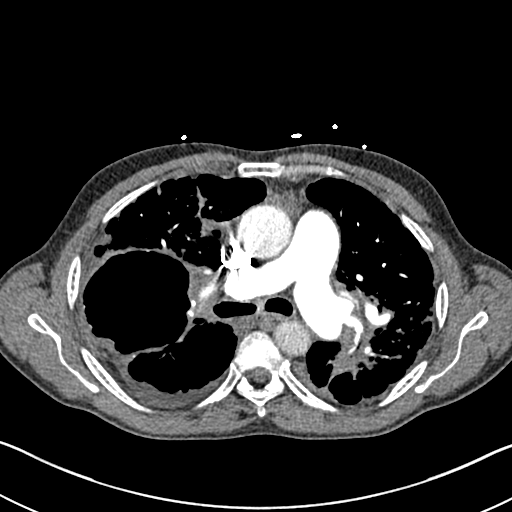
[im 190/273  lung]
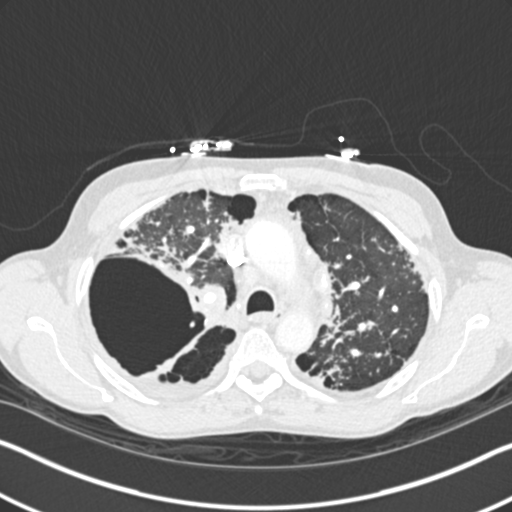
[im 202/273  soft-tissue]
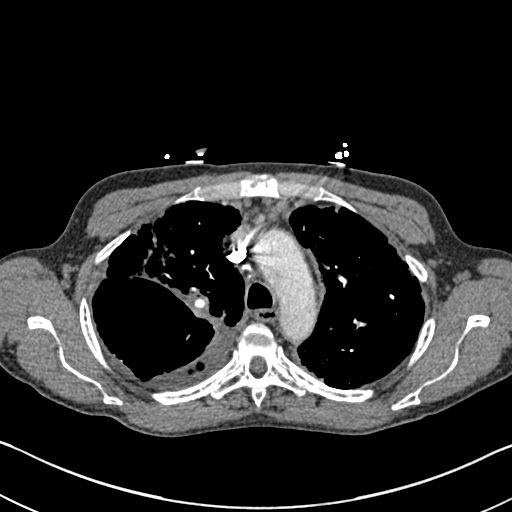
[im 225/273  lung]
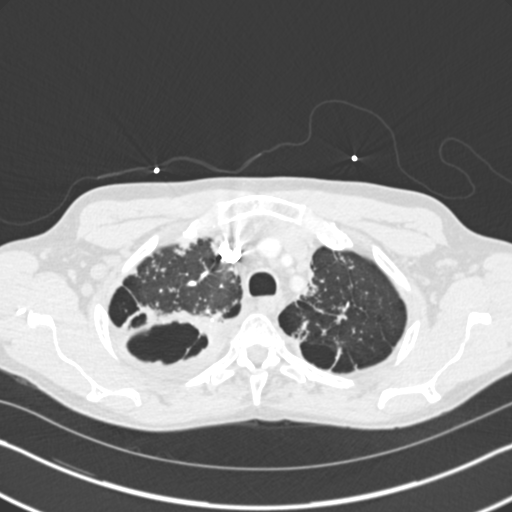
[im 237/273  soft-tissue]
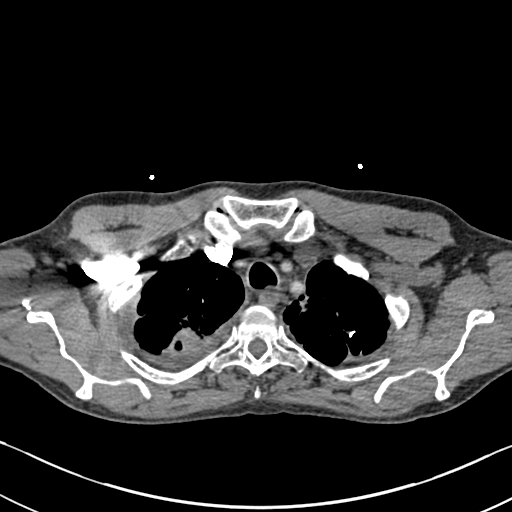
[im 261/273  lung]
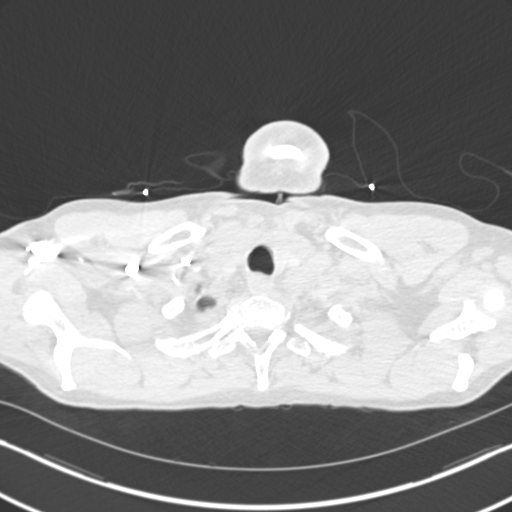

[Series 6: coronal mpr · coronal · 0.53mm/px · 3 of 119 slices shown]
[im 30/119  soft-tissue]
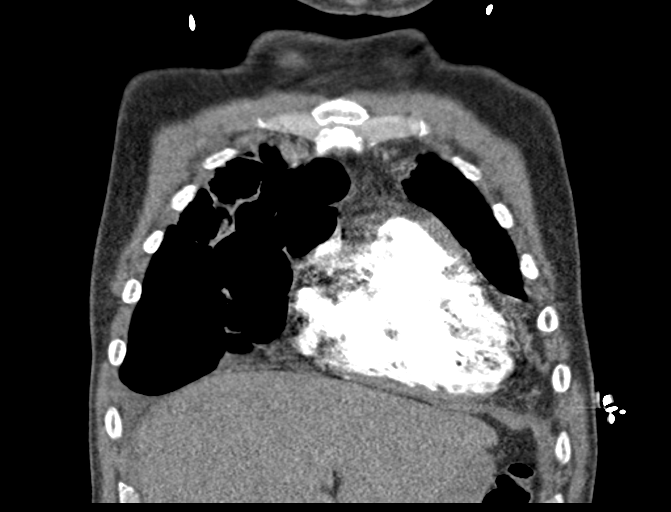
[im 60/119  soft-tissue]
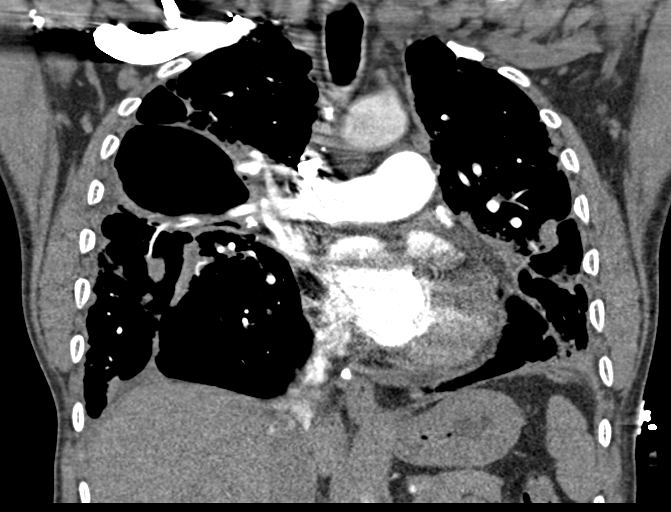
[im 89/119  soft-tissue]
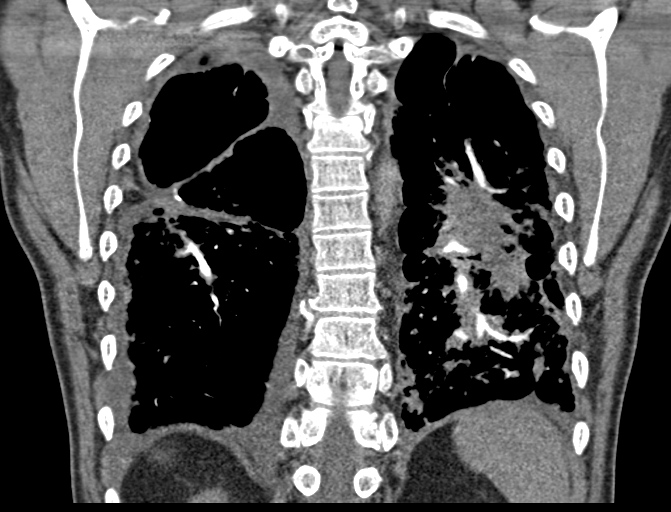

[18 of 46 positions shown; findings below may reference images not displayed]

FINDINGS: Cardiovascular: Normal heart size. There is right ventricular
enlargement relative to the left. The main pulmonary artery is
dilated at 33 mm. No pulmonary embolism is seen. There is diffuse
architectural distortion and blunting of pulmonary arteries related
to fibrosis.

Mediastinum/Nodes: Calcified mediastinal and hilar lymphadenopathy
correlating with sarcoidosis history.

Lungs/Pleura: Severe parenchymal disease with patchy bronchovascular
and interstitial pattern reticular and nodular densities. Chronic
cavity in the right mid lung measuring 8.5 cm. Fluid level seen
previously is resolved. There is generalized severe airway narrowing
and poor luminal visualization. Superimposed paraseptal emphysema.
Small right pleural effusion. No generalized interstitial opacity.
No acute airspace opacity.

Upper Abdomen: Negative

Musculoskeletal: No acute or aggressive finding

Review of the MIP images confirms the above findings.
IMPRESSION: 1. Negative for pulmonary embolism.
2. Small right pleural effusion that is new from 6 months prior. No
definite edema or pneumonia.
3. Sarcoidosis with advanced parenchymal and nodal involvement.
4. Chronic right upper lobe cavity measuring up to 8.5 cm. Fluid
level seen previously is resolved.
5. Advanced generalized airway thickening, chronic.
6. Dilated right ventricle compared to the left. Question cor
pulmonale.

## 2018-06-24 IMAGING — CR DG CHEST 2V
2 series · 2 of 2 positions shown · non-contrast
Comparison: 03/10/2017 and multiple previous

CLINICAL DATA: Intermittent chest pain and shortness of breath.

EXAM:
CHEST - 2 VIEW

[w chest lat]
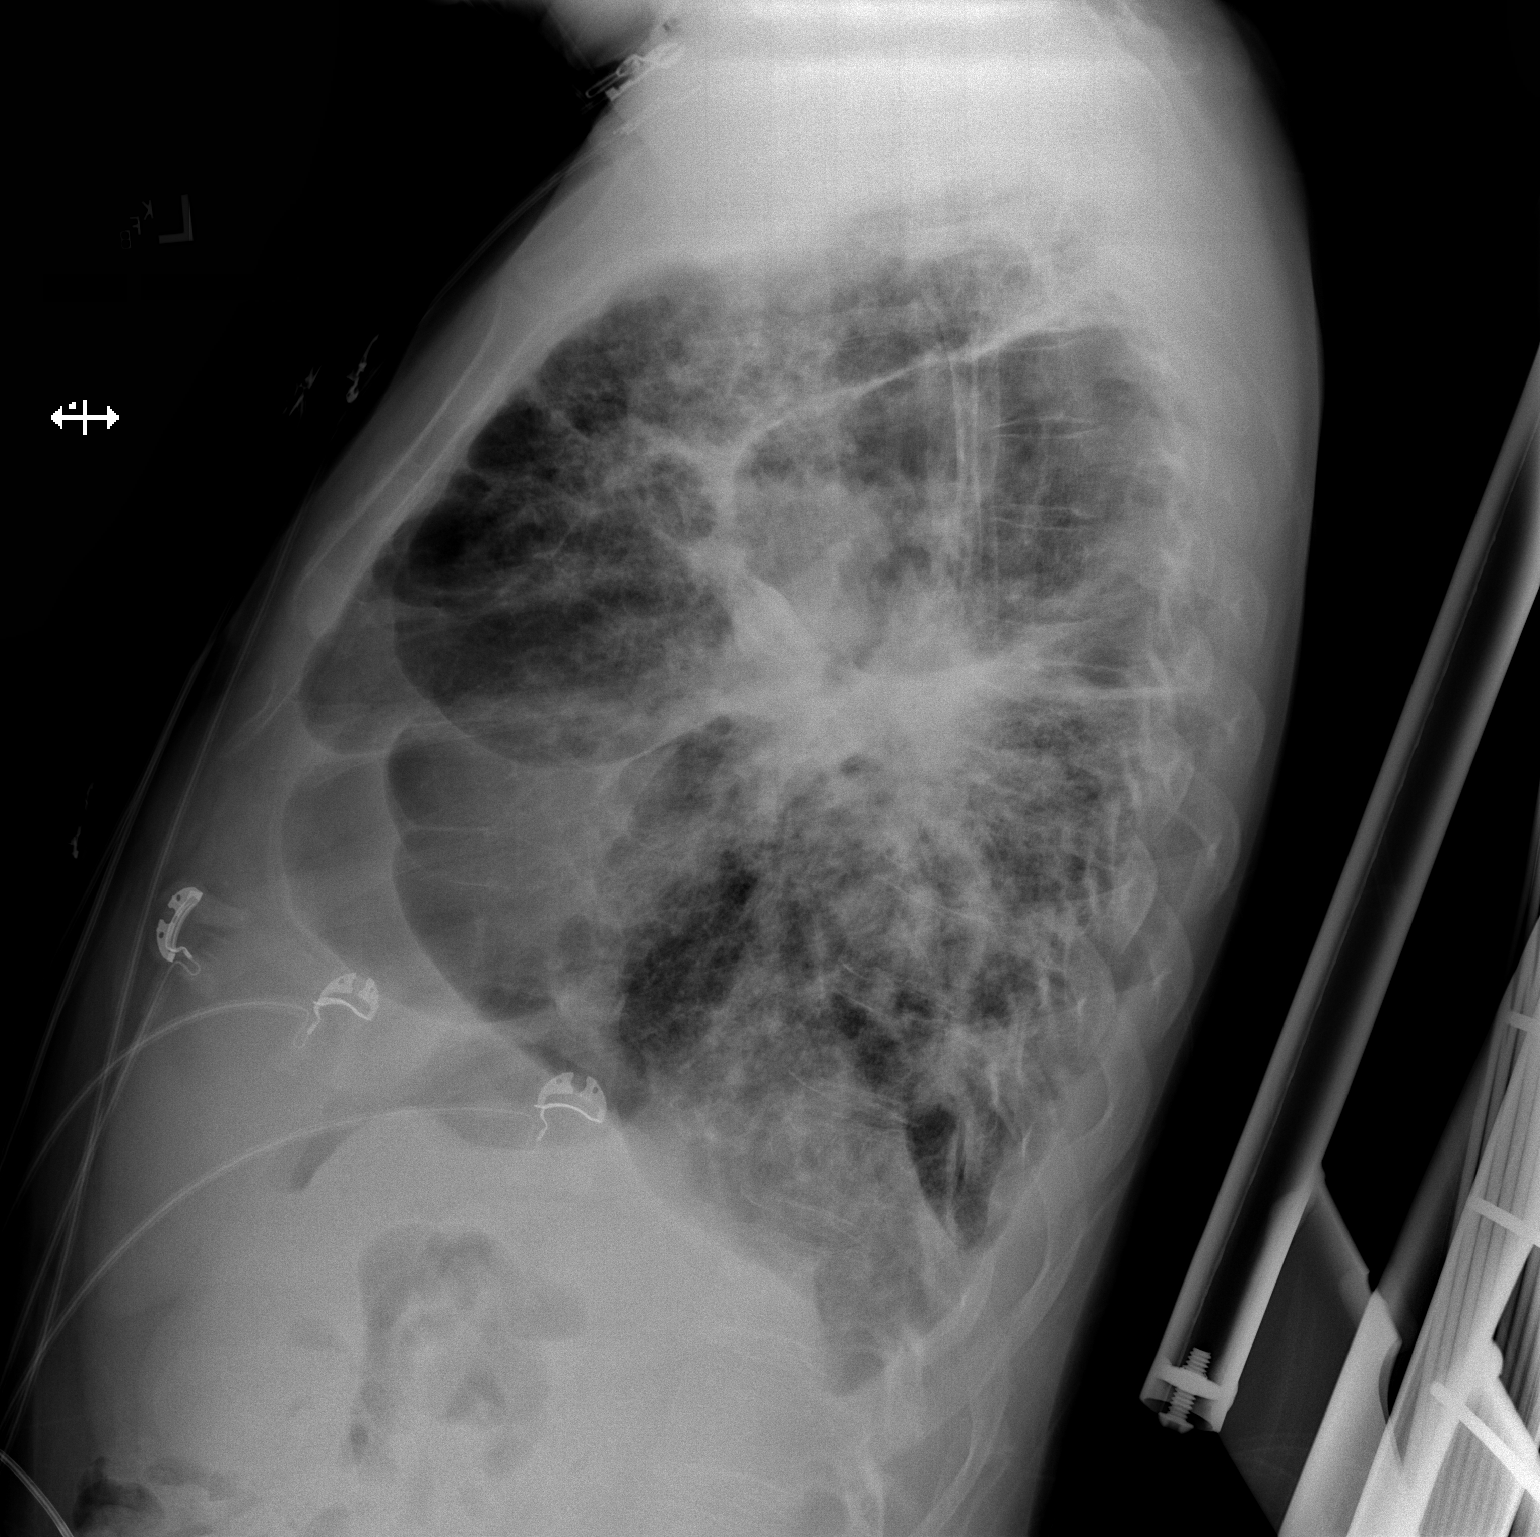

[x chest ap]
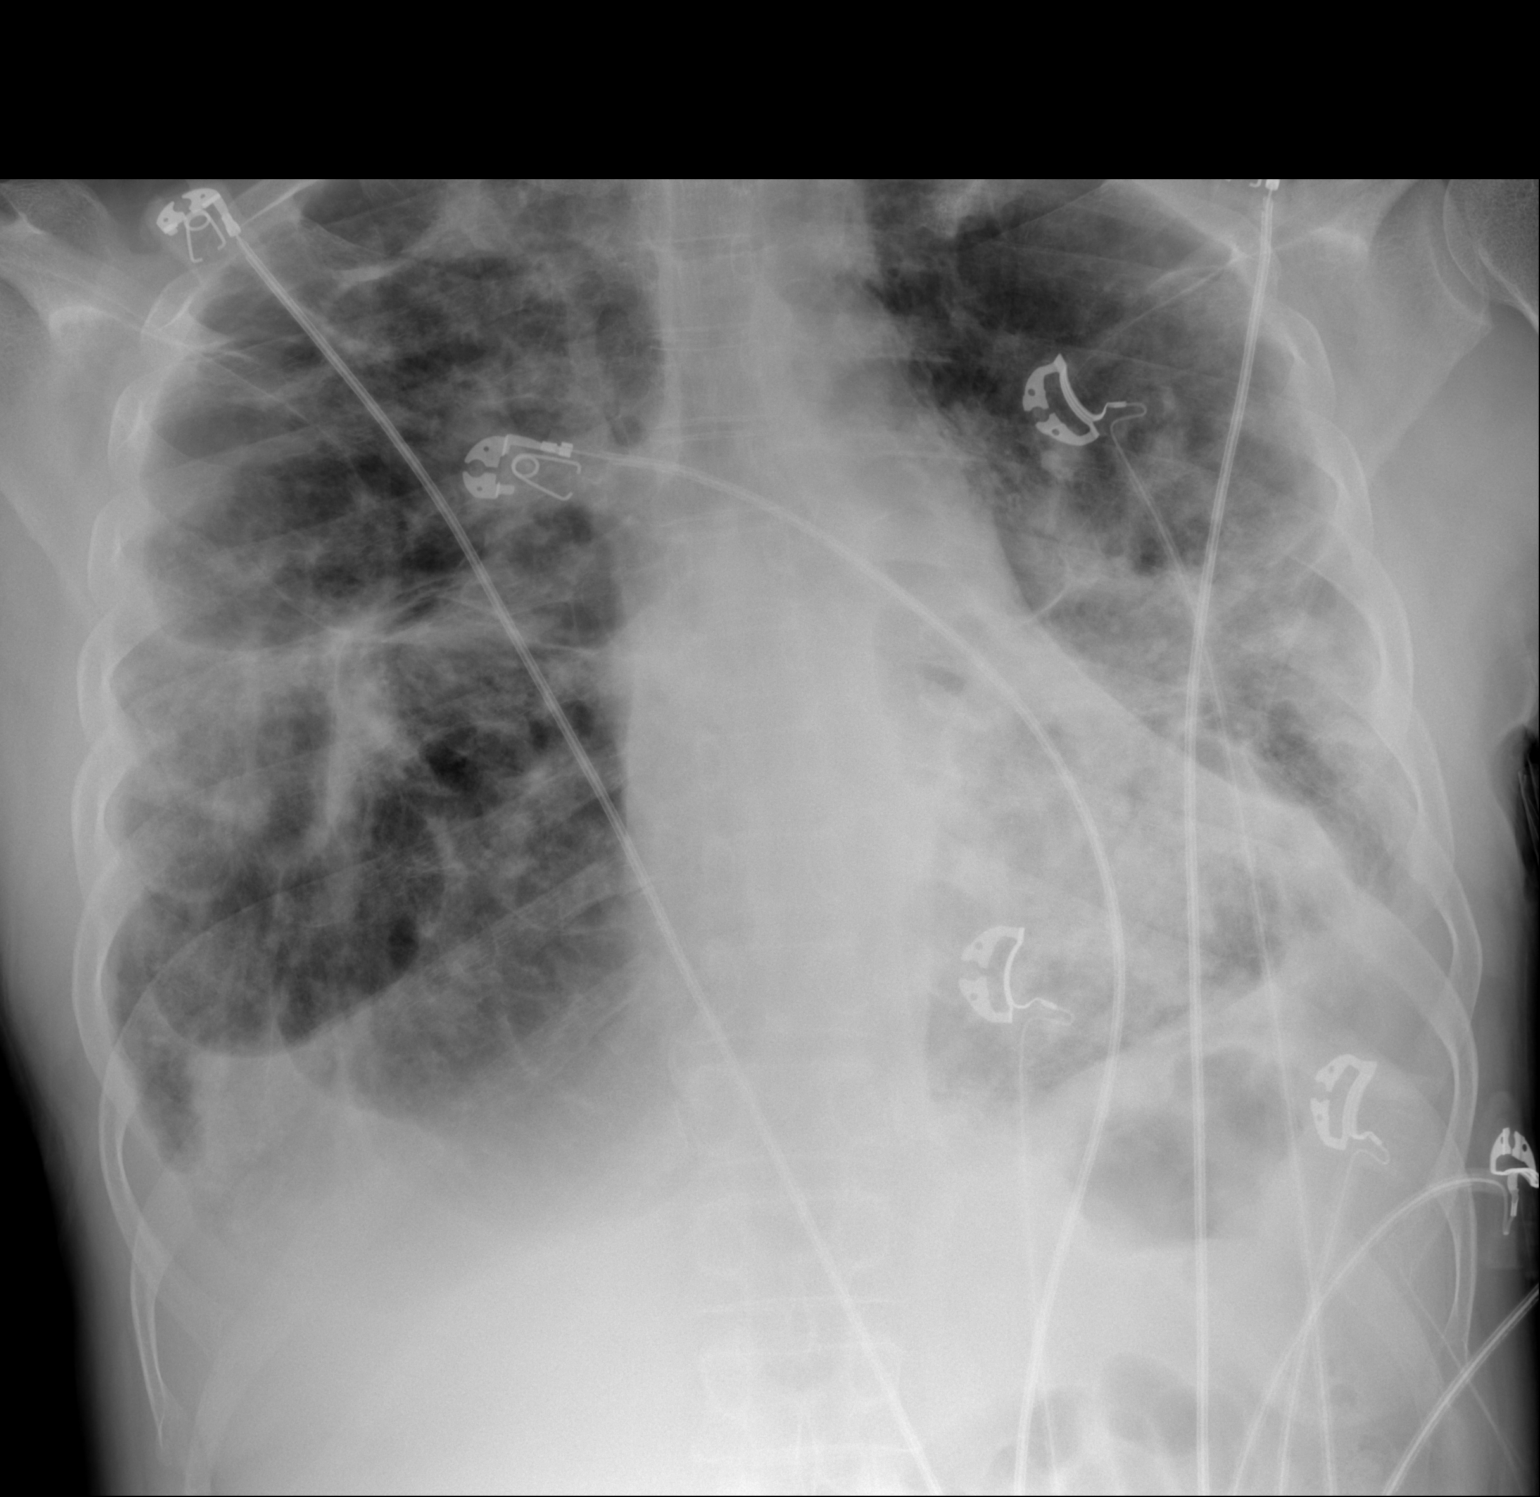

[2 of 2 positions shown; findings below may reference images not displayed]

FINDINGS: Background pattern of advanced chronic fibrosis. Allowing for
technical differences, the appearance is quite similar. No
identifiable acute infiltrate, effusion or collapse. Certainly,
acute inflammatory changes could be hidden within the extensive
chronic disease.
IMPRESSION: Advanced chronic lung disease with scarring. No acute finding,
though acute inflammatory changes could be hidden within the
extensive chronic densities.

## 2018-06-29 IMAGING — DX DG CHEST 1V PORT
1 series · 1 of 1 positions shown · non-contrast
Comparison: April 13, 2017 chest radiograph and chest CT

CLINICAL DATA: Cough with respiratory distress. History of
sarcoidosis

EXAM:
PORTABLE CHEST 1 VIEW

[chest]
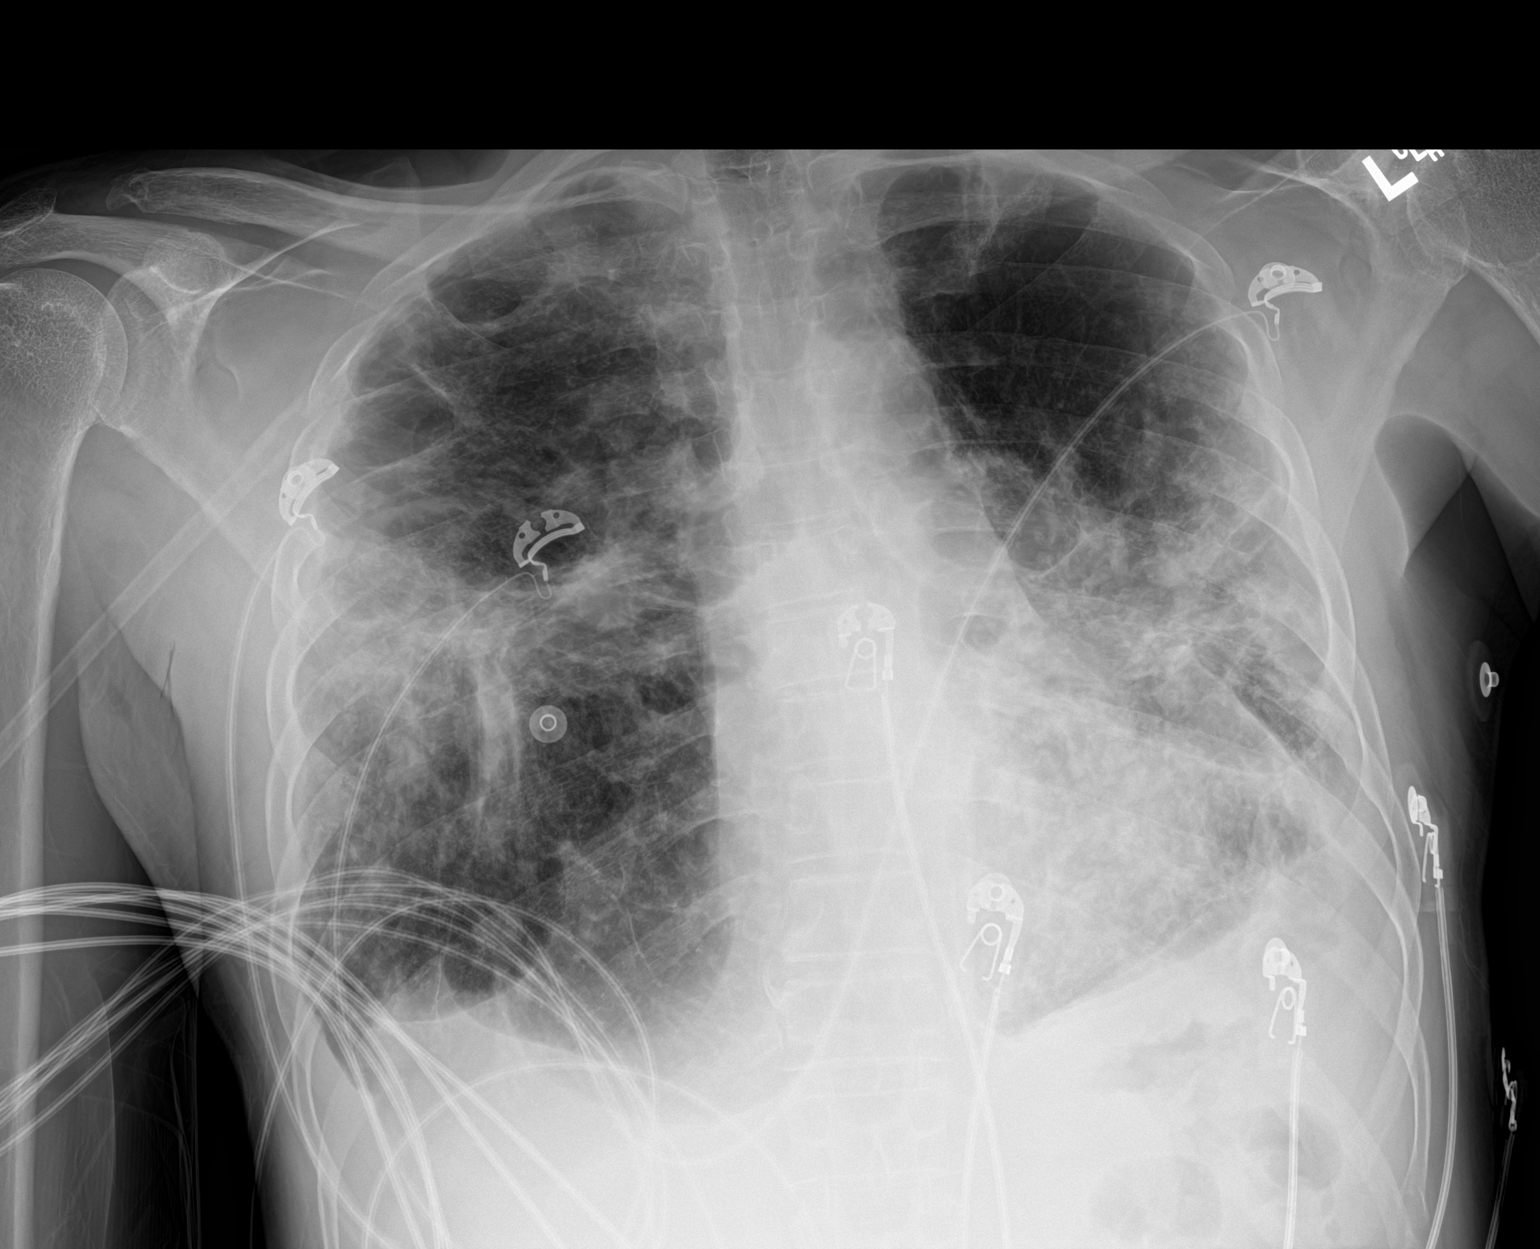

[1 of 1 positions shown; findings below may reference images not displayed]

FINDINGS: There is extensive underlying fibrosis and bullous disease. There
are areas of patchy consolidation in both mid lung and left base
regions, stable and felt to be due to chronic scarring superimposed
on fibrosis.. No new opacity evident. Heart size is normal.
Pulmonary vascularity reflects underlying emphysematous change. No
adenopathy is appreciable by radiography.
IMPRESSION: Extensive chronic scarring and fibrosis consistent with history of
sarcoidosis. No new airspace opacity. No adenopathy evident by
radiography. Stable cardiac silhouette. Underlying emphysematous
change.

Emphysema ([id number]).

## 2018-07-03 IMAGING — CT CT CHEST W/O CM
2 of 4 series · 15 of 36 positions shown, 18 images · non-contrast
Comparison: Chest radiograph dated 04/22/2017. CT chest dated
04/13/2017.

CLINICAL DATA: Shortness of breath/respiratory distress, emergently
intubated

EXAM:
CT CHEST WITHOUT CONTRAST
TECHNIQUE: Multidetector CT imaging of the chest was performed following the
standard protocol without IV contrast.

[Series 2: thorax · axial · 0.76mm/px · z∈[-536,-274]mm · 12 of 153 slices shown, 15 images]
[im 11/153  mediastinal]
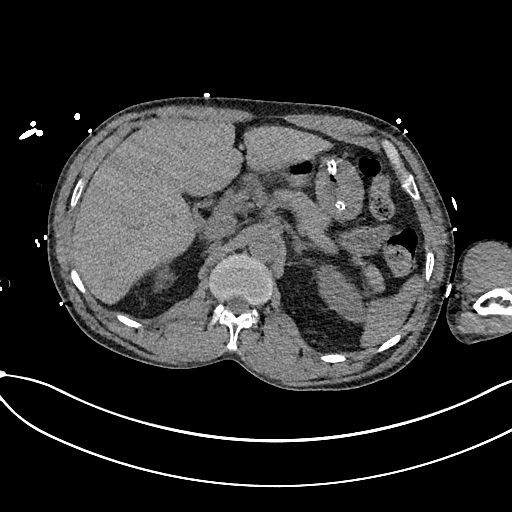
[im 11/153  lung]
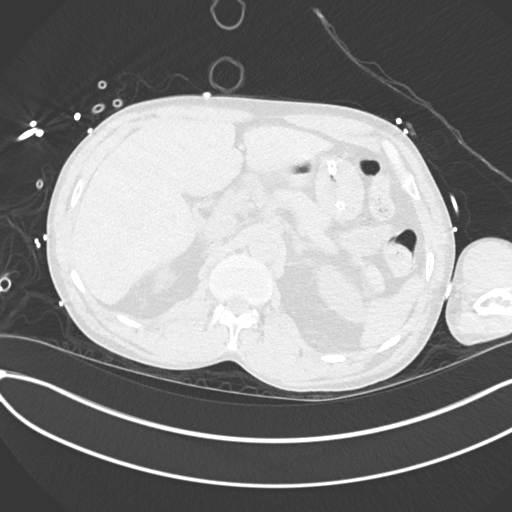
[im 22/153  lung]
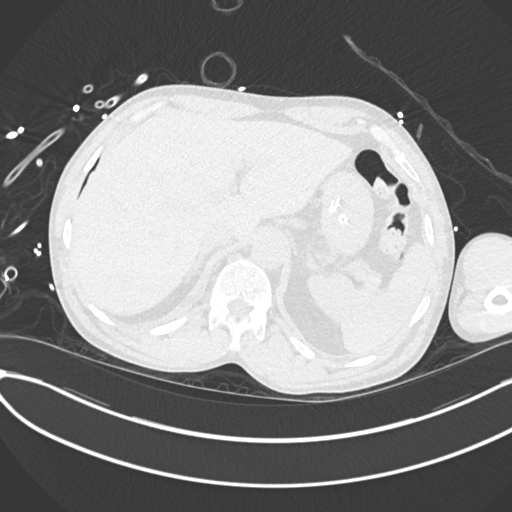
[im 33/153  lung]
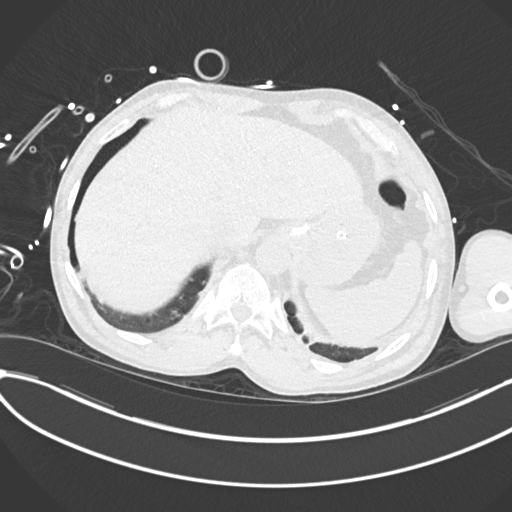
[im 44/153  lung]
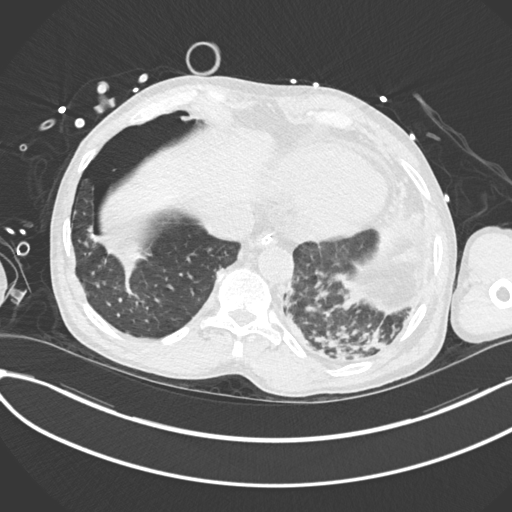
[im 55/153  mediastinal]
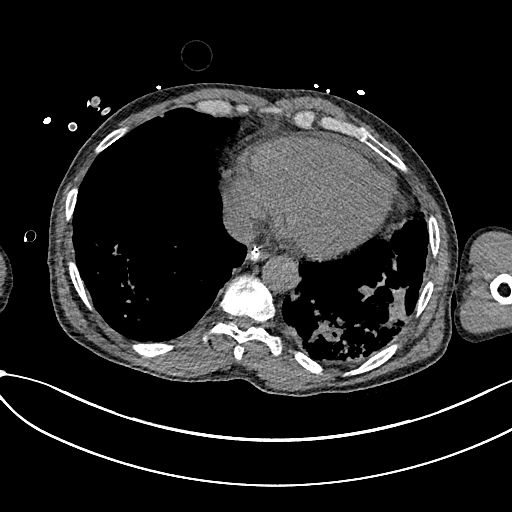
[im 55/153  lung]
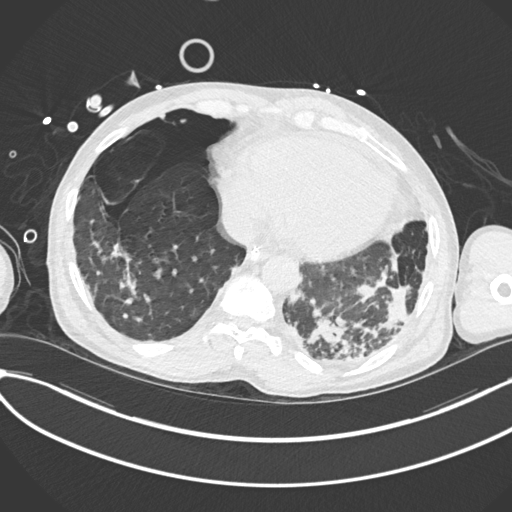
[im 66/153  lung]
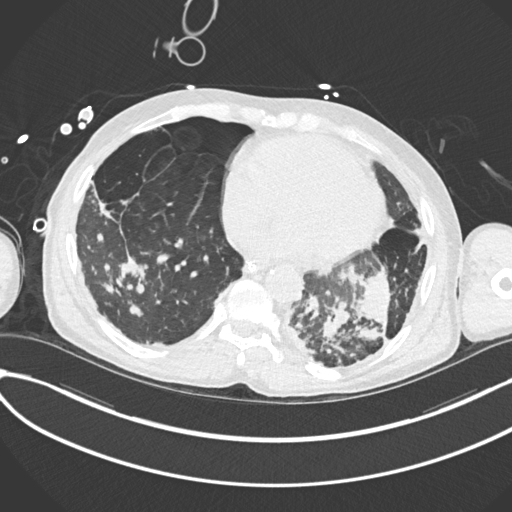
[im 87/153  lung]
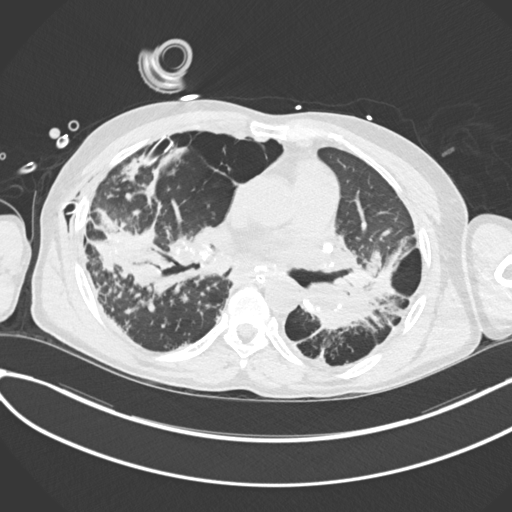
[im 98/153  lung]
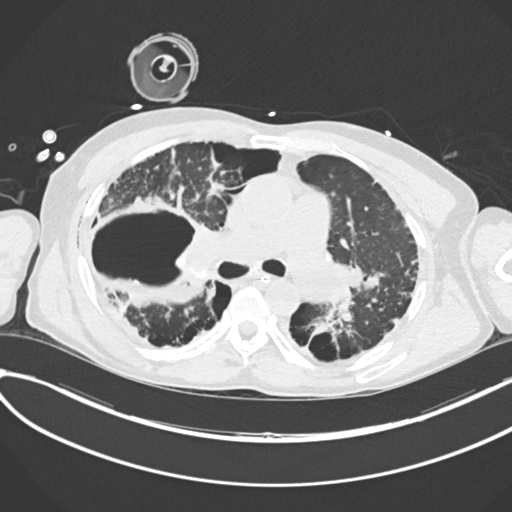
[im 109/153  mediastinal]
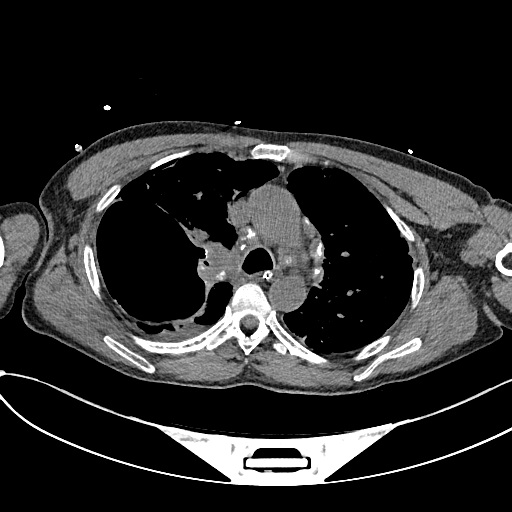
[im 109/153  lung]
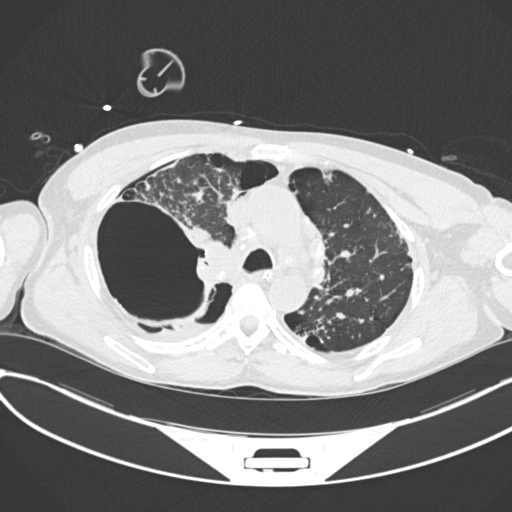
[im 120/153  lung]
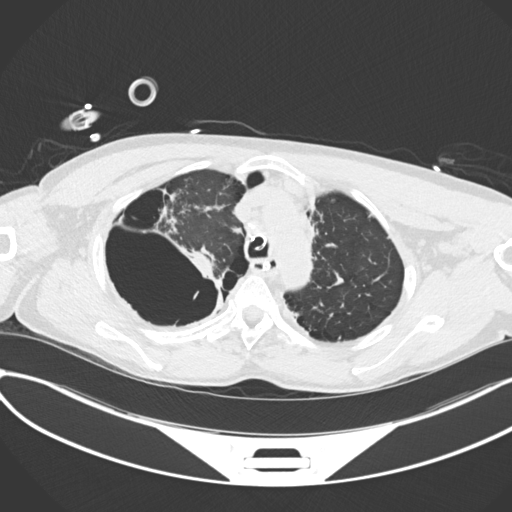
[im 131/153  lung]
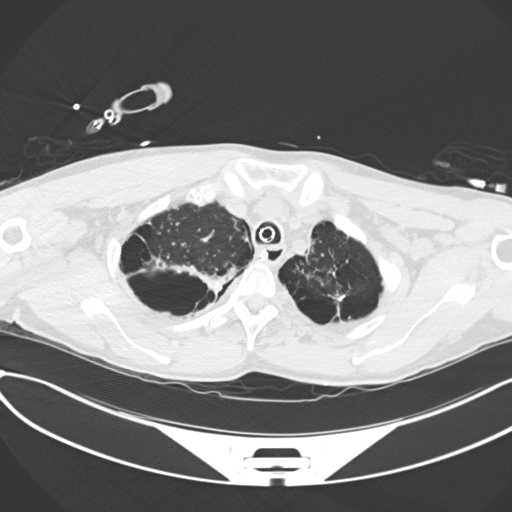
[im 142/153  lung]
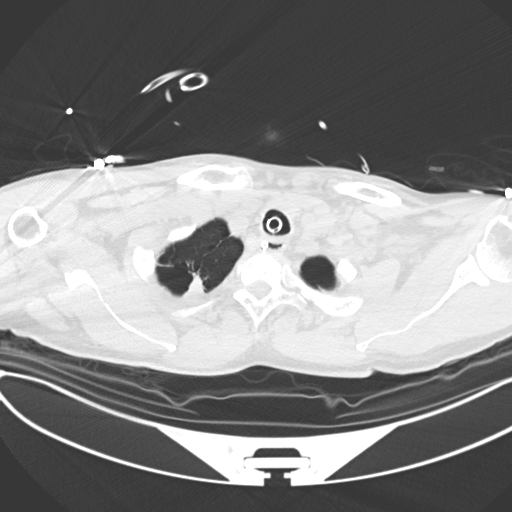

[Series 5: coronal · coronal · 0.69mm/px · 3 of 115 slices shown]
[im 23/115  lung]
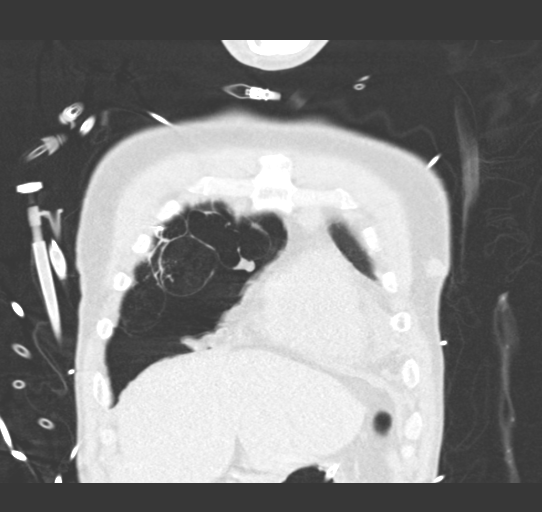
[im 46/115  lung]
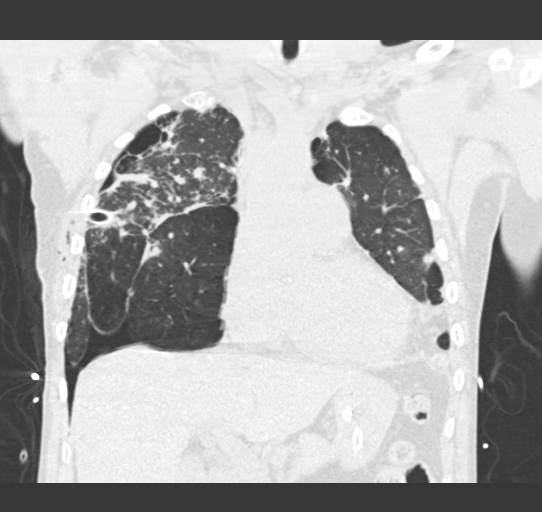
[im 69/115  lung]
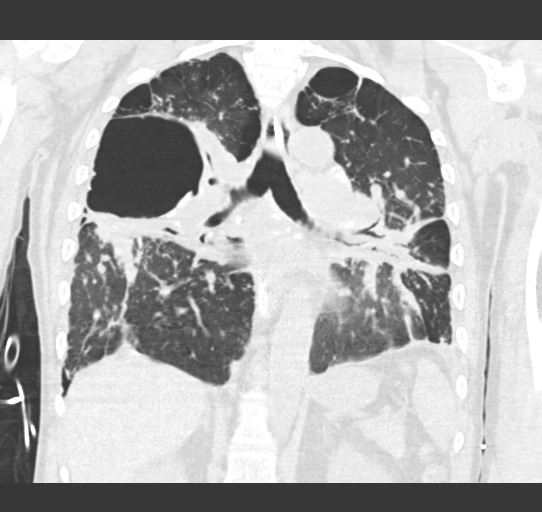

[15 of 36 positions shown; findings below may reference images not displayed]

FINDINGS: Cardiovascular: Heart is normal in size. Trace inferior pericardial
fluid.

No evidence of thoracic aortic aneurysm.

Mediastinum/Nodes: Calcified mediastinal and bilateral hilar
lymphadenopathy, unchanged, likely related to the patient's history
of sarcoidosis.

Visualized thyroid is unremarkable.

Lungs/Pleura: Endotracheal tube terminates 3 cm above the carina.

Upper lobe predominant peribronchovascular nodularity with
interstitial thickening and upward hilar retraction, with additional
peribronchovascular nodularity extending into the left lower lobe,
unchanged from prior CT and likely related to the patient's known
sarcoidosis.

Small right pneumothorax, anterior lower lung predominant (series
7/image 103). This is new from prior CT but was noted on interval
chest radiographs. Indwelling right chest tube anteriorly in the
right mid hemithorax (series 7/image 63).

Thin-walled cavitary lesion/bulla in the anterior right upper lobe
measuring 10.7 x 7.3 cm (series 7/image 41). This is mildly
increased from prior CT, but was previously more thick-walled.

Additional paraseptal emphysematous changes, upper lobe predominant.

No pleural effusion.

Upper Abdomen: Visualized upper abdomen is notable for an enteric
tube looped in the gastric cardia.

Musculoskeletal: Visualized osseous structures are within normal
limits.
IMPRESSION: Endotracheal tube terminates 3 cm above the carina.

Small right pneumothorax, new from prior CT, although noted on
interval chest radiographs. Indwelling chest tube.

Stable findings of sarcoidosis with superimposed emphysema, as
described above.

Emphysema ([id number]).

## 2018-07-03 IMAGING — DX DG CHEST 1V PORT
1 series · 1 of 1 positions shown · non-contrast
Comparison: 04/18/2017

CLINICAL DATA: Post intubation.

EXAM:
PORTABLE CHEST 1 VIEW

[chest ap]
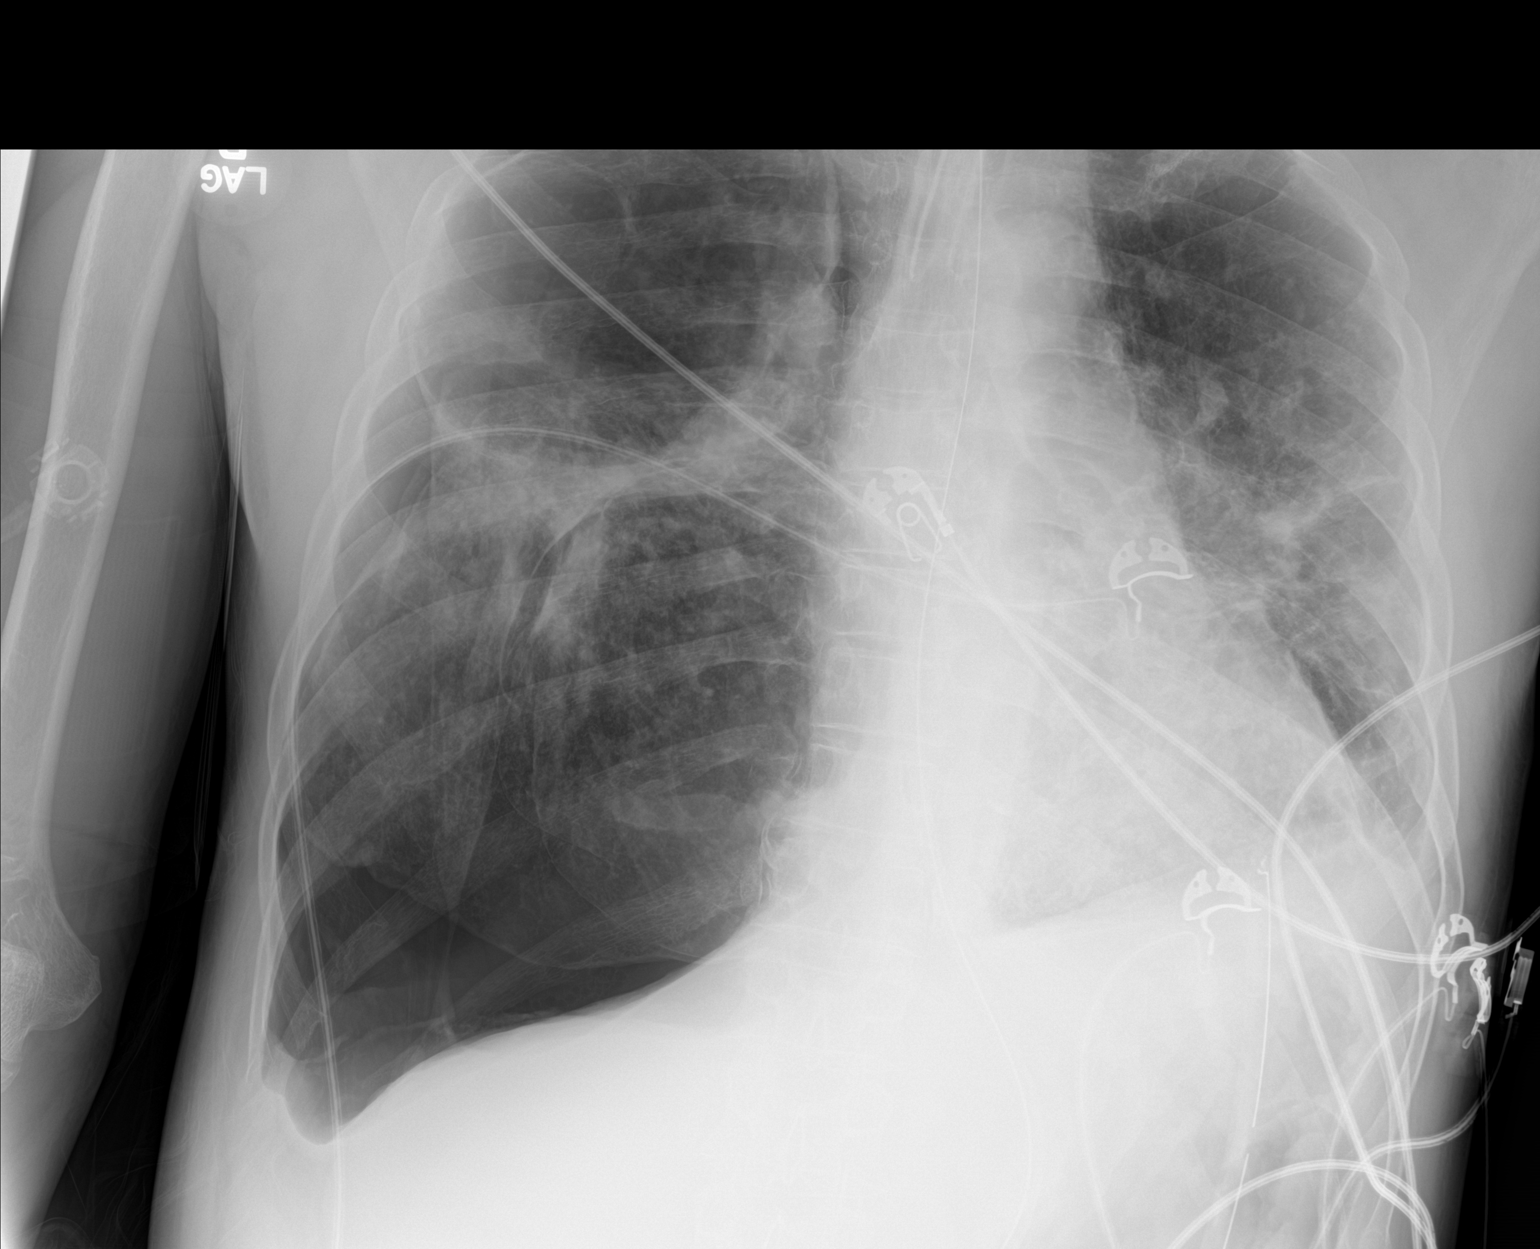

[1 of 1 positions shown; findings below may reference images not displayed]

FINDINGS: Moderate right pneumothorax, approximately 30%. There is mild right
to left midline shift. Endotracheal tube is 3.1 cm from the carina.
Enteric tube in place with tip and side-port below the diaphragm.
Normal heart size. Extensive bilateral perihilar scarring. Chronic
blunting of left costophrenic angle.
IMPRESSION: 1. Moderate right pneumothorax approximately 30% with mild right to
left midline shift.
2. Endotracheal tube 3.1 cm from the carina.  Enteric tube in place.
3. Bilateral lung scarring consistent with history of sarcoidosis.

Critical Value/emergent results were called by telephone at the time
of interpretation on 04/22/2017 at [DATE] to Dr. FOMIN LIN ,
who verbally acknowledged these results.

## 2018-07-03 IMAGING — DX DG ABDOMEN 1V
1 series · 1 of 1 positions shown · non-contrast
Comparison: Radiograph 03/24/2014

CLINICAL DATA: OG tube placement.

EXAM:
ABDOMEN - 1 VIEW

[abdomen kub]
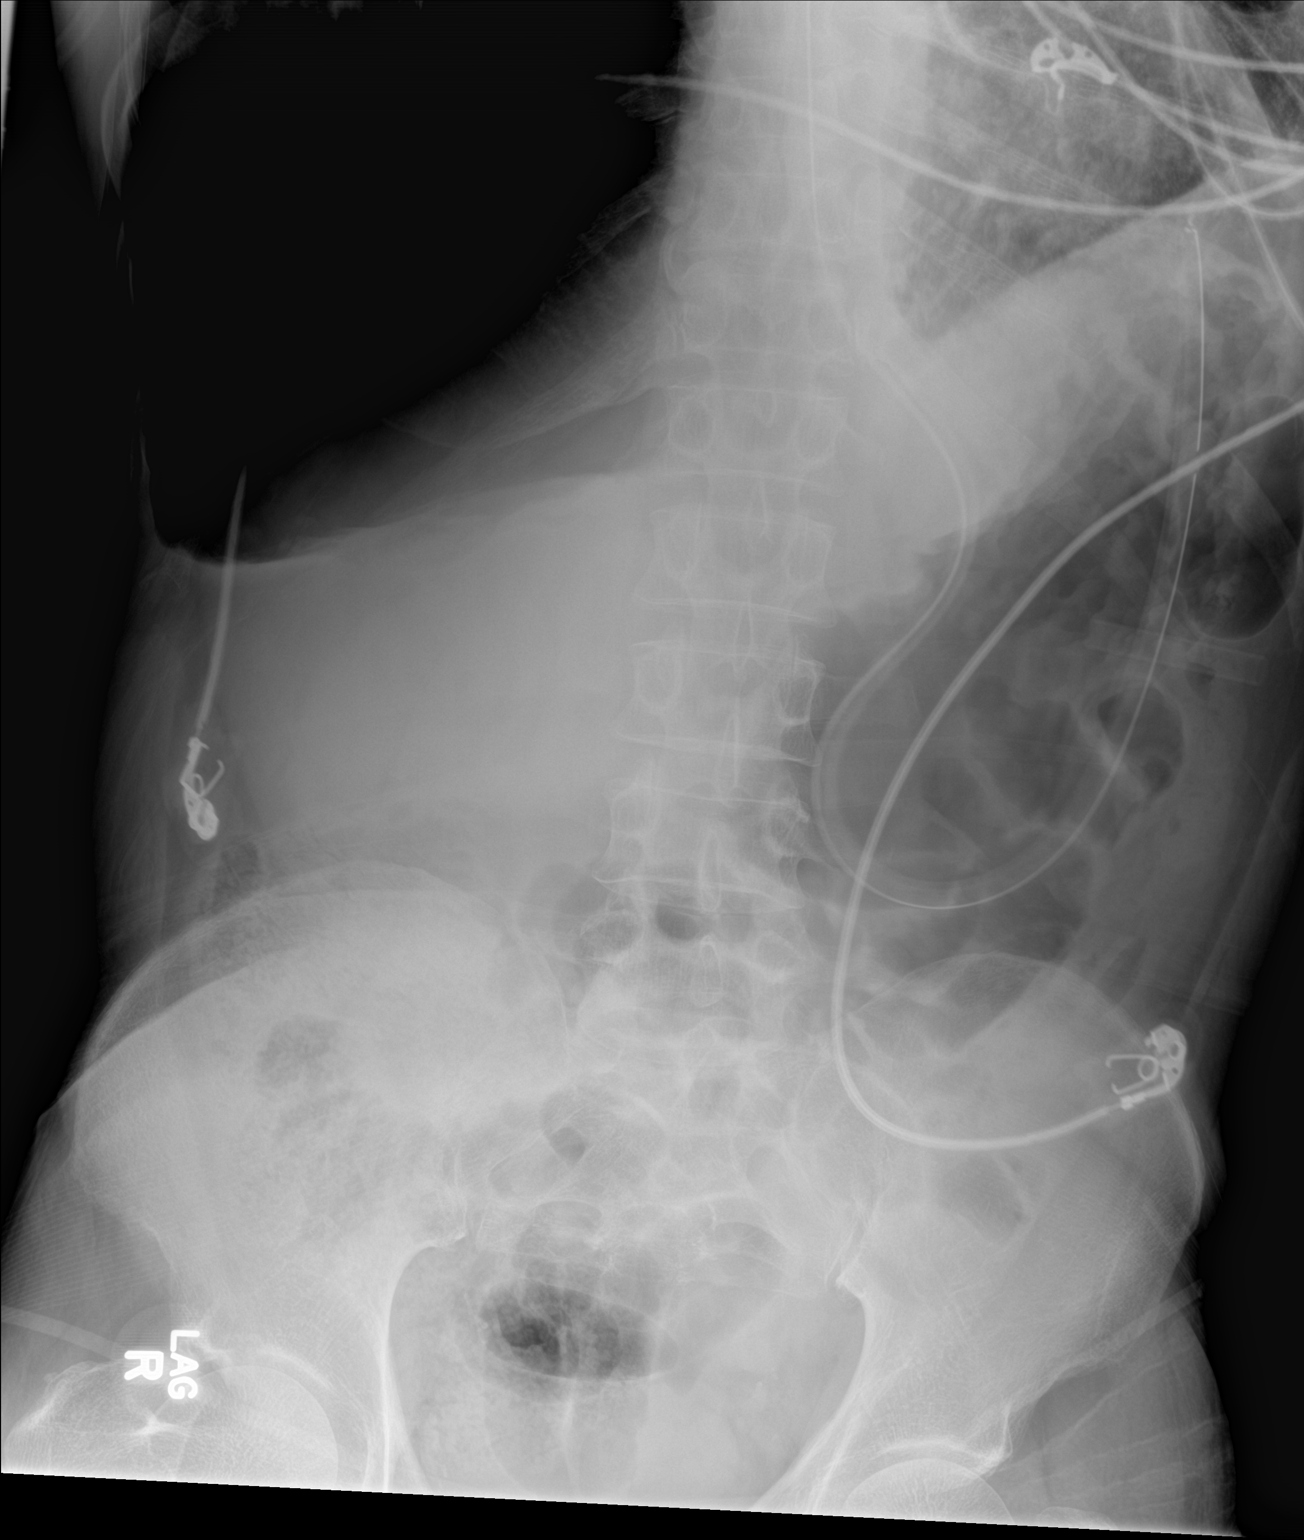

[1 of 1 positions shown; findings below may reference images not displayed]

FINDINGS: Tip and side port of the enteric tube below the diaphragm in the
stomach. No bowel dilatation to suggest obstruction. Right basilar
pneumothorax is seen on concurrent chest radiograph.
IMPRESSION: Tip and side port of the enteric tube below the diaphragm in the
stomach.

## 2018-07-03 IMAGING — DX DG CHEST 1V PORT
1 series · 1 of 1 positions shown · non-contrast
Comparison: Prior radiograph from earlier the same day.

CLINICAL DATA: Initial evaluation status post chest tube insertion.

EXAM:
PORTABLE CHEST 1 VIEW

[chest ap]
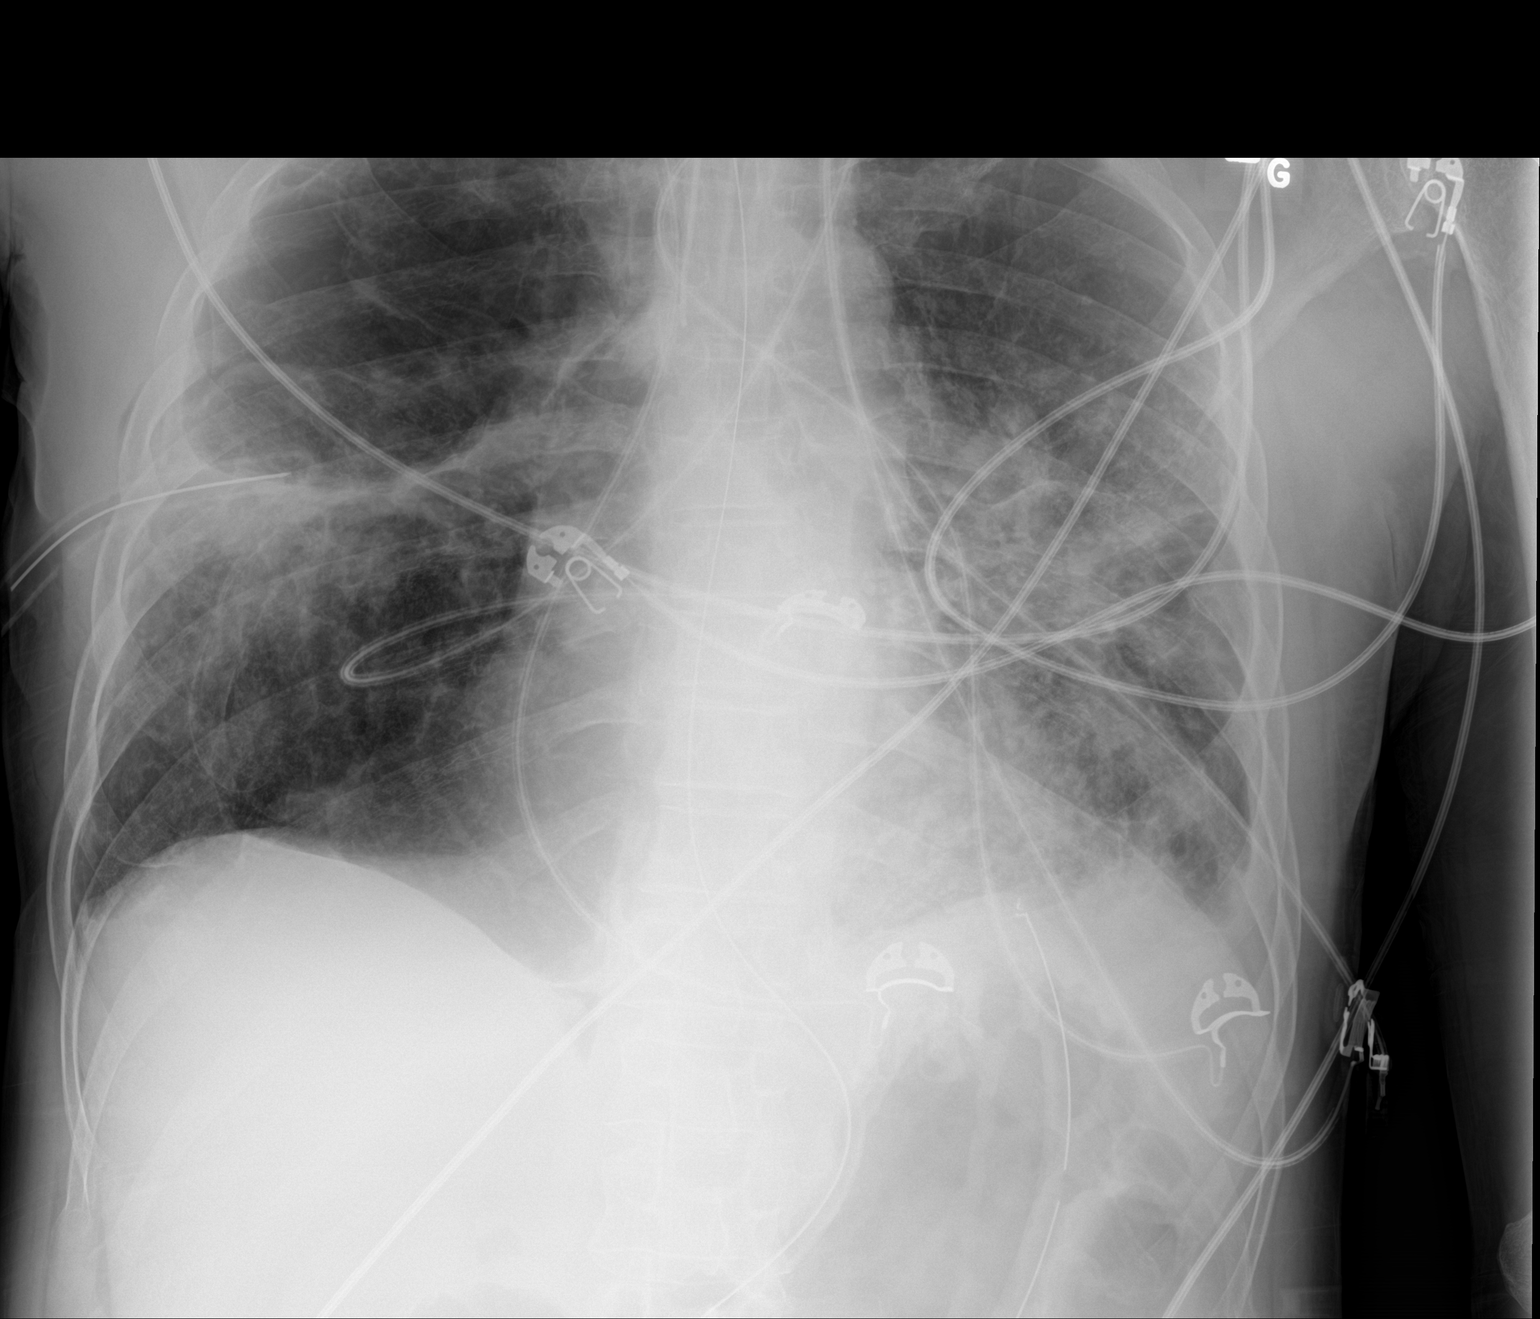

[1 of 1 positions shown; findings below may reference images not displayed]

FINDINGS: Patient remains intubated with the tip of the endotracheal tube well
positioned 2.6 cm above the carina. Enteric tube courses in the
abdomen. Mild cardiomegaly is unchanged. Mediastinal silhouette
within normal limits.

There has been interval placement of a large-bore chest tube with
tip overlying the right mid lung, side hole outside the confines of
the bony right hemithorax. Interval re-expansion of previously seen
moderate right pneumothorax, with a small basilar and lateral
components now seen.

Bilateral pulmonary scarring again noted, consistent with history of
sarcoidosis. Left mid and lower lung opacities are slightly
worsened, suspected to reflect progressive atelectasis and/or edema,
although infiltrate could be considered in the correct clinical
setting. Degree of underlying pulmonary vascular congestion noted.
Small bilateral pleural effusions. Suture material overlies the left
lung apex.

Osseous structures are unchanged.
IMPRESSION: 1. Interval placement of right-sided chest tube with tip overlying
the right midlung. Side hole outside the bony confines of the right
hemithorax. Previously seen right pneumothorax markedly improved,
with small right basilar and lateral components now visualized.
2. Remaining support apparatus in satisfactory position.
3. Bilateral pulmonary scarring, consistent with history of
sarcoidosis. Left mid and lower lung opacities are slightly worsen,
which may reflect progressive atelectasis and/or edema. Superimposed
infiltrate could be considered in the correct clinical setting.
4. Mild underlying pulmonary vascular congestion with small
bilateral pleural effusions.

## 2018-07-03 IMAGING — DX DG CHEST 1V PORT
1 series · 1 of 1 positions shown · non-contrast
Comparison: 1539 hours on the same day

CLINICAL DATA: Advancement of chest tube.

EXAM:
PORTABLE CHEST 1 VIEW

[chest ap]
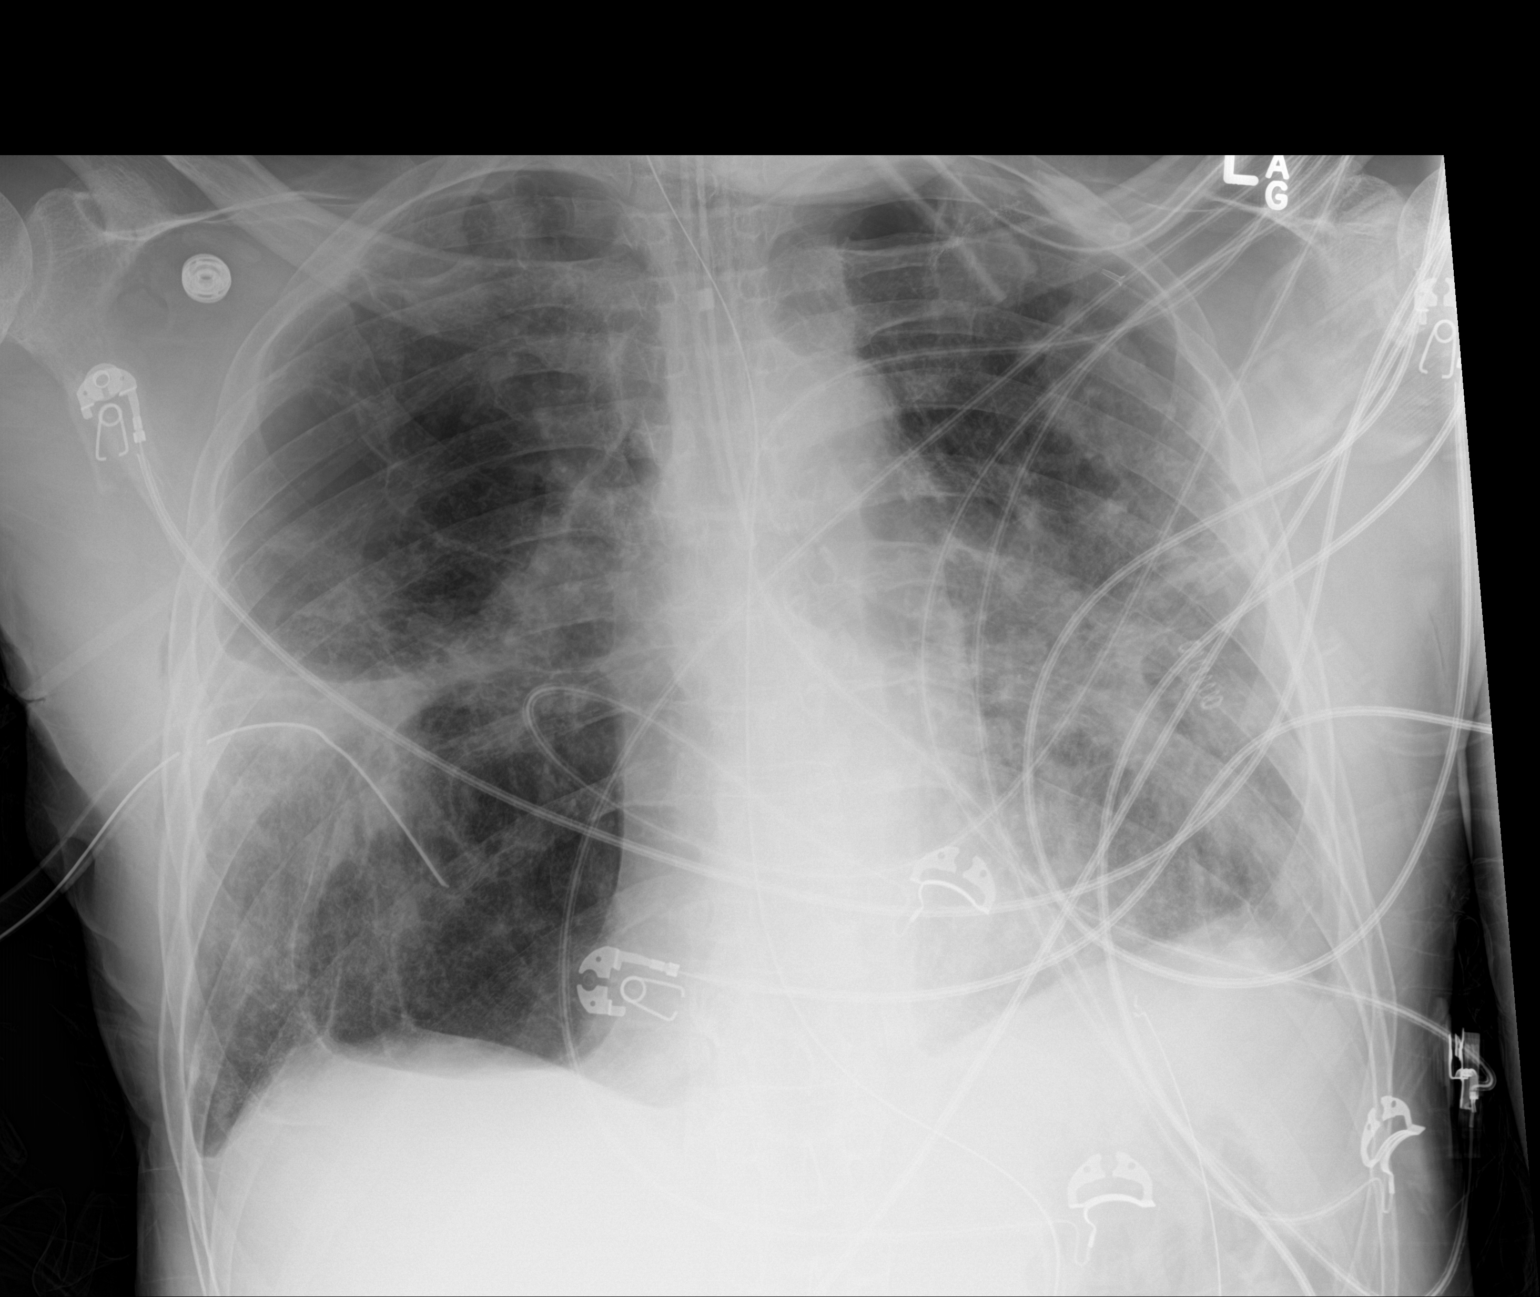

[1 of 1 positions shown; findings below may reference images not displayed]

FINDINGS: Advancement of the right-sided chest tube is noted with side port
now seen just inside the inner aspect of the right lateral sixth
rib. The small pneumothorax is no longer apparent along the
periphery of the right upper lobe. Bilateral parenchymal opacities
remain unchanged.

A gastric tube extends below the left hemidiaphragm with tip coiled
back up towards the gastric fundus. This is similar in appearance.

Endotracheal tube tip is 1.5 cm above the carina. An additional
linear density projects over the left mainstem bronchus of uncertain
etiology. This could be external to the patient. Left mainstem
endobronchial intubation is believed less likely. Parenchymal
changes of the lung remain stable.
IMPRESSION: 1. Further advancement right-sided chest tube has been performed.
The small pneumothorax is no longer apparent. Side port of the chest
to is seen just medial to the right lateral sixth rib.
2. What appears to be the tip of an endotracheal tube is 1.5 cm
above the carina on current study versus 2.6 cm previously. There is
a linear density that projects over the left mainstem bronchus of
uncertain etiology possibly external to the patient. This was
discussed with Dr. Brough prior to finalization this report to
further correlate clinically. This could potentially be external to
the patient possibly an external cardiac monitoring device or lead.

## 2018-07-03 IMAGING — CT CT HEAD W/O CM
3 series · 15 of 46 positions shown, 18 images · non-contrast
Comparison: Neck CT 01/15/2009.

CLINICAL DATA: 51-year-old male with cough and respiratory
distress. Thoracic sarcoidosis.

EXAM:
CT HEAD WITHOUT CONTRAST
TECHNIQUE: Contiguous axial images were obtained from the base of the skull
through the vertex without intravenous contrast.

[Series 2: head wo · axial · 0.47mm/px · z∈[-138,-18]mm · 9 of 29 slices shown, 12 images]
[im 3/29  brain]
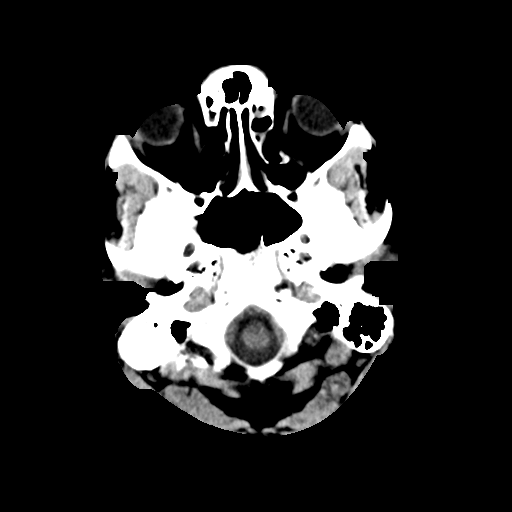
[im 3/29  bone]
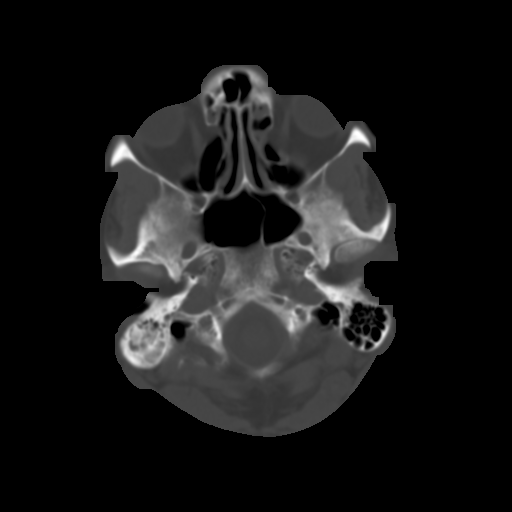
[im 6/29  brain]
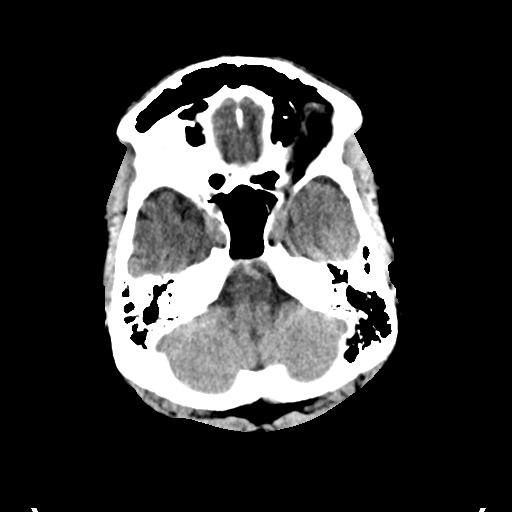
[im 9/29  brain]
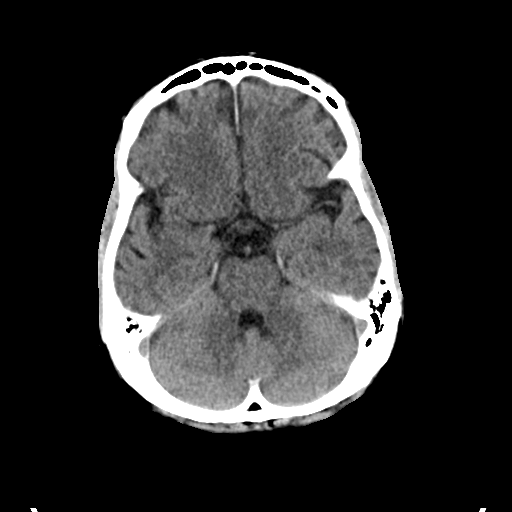
[im 12/29  brain]
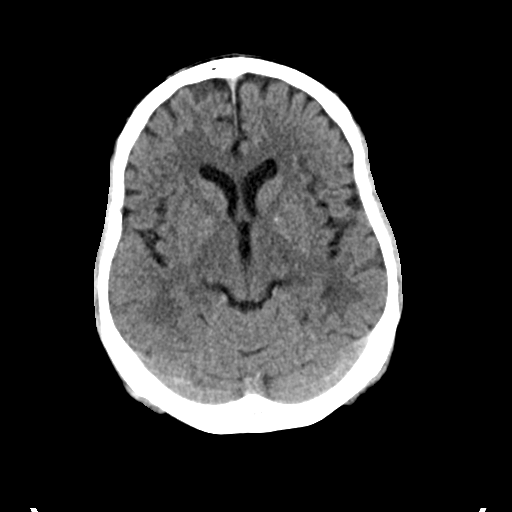
[im 15/29  brain]
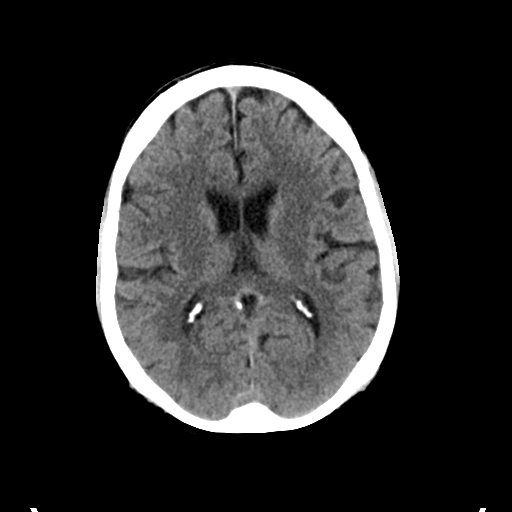
[im 15/29  bone]
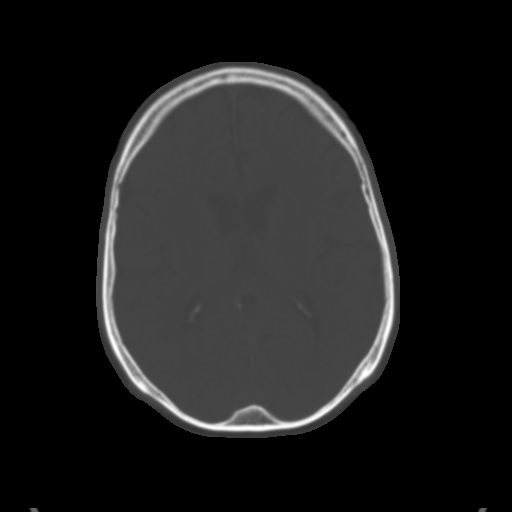
[im 18/29  brain]
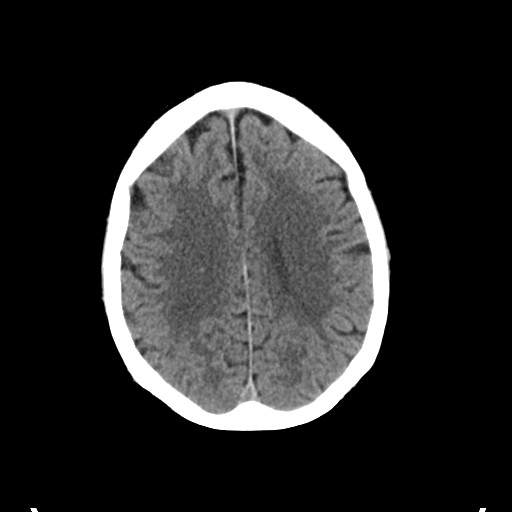
[im 21/29  brain]
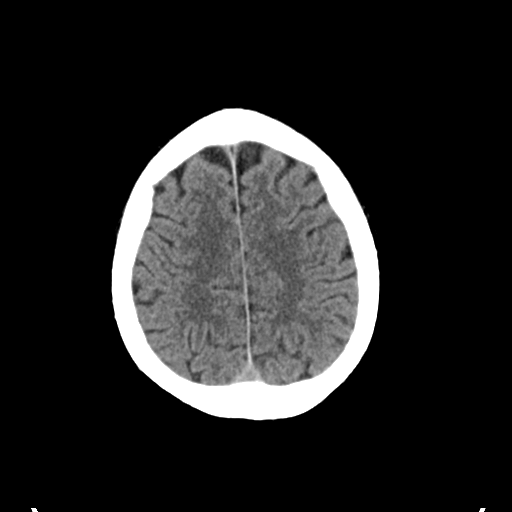
[im 24/29  brain]
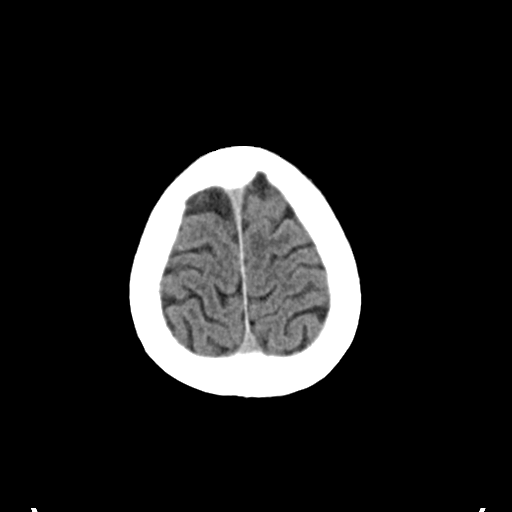
[im 27/29  brain]
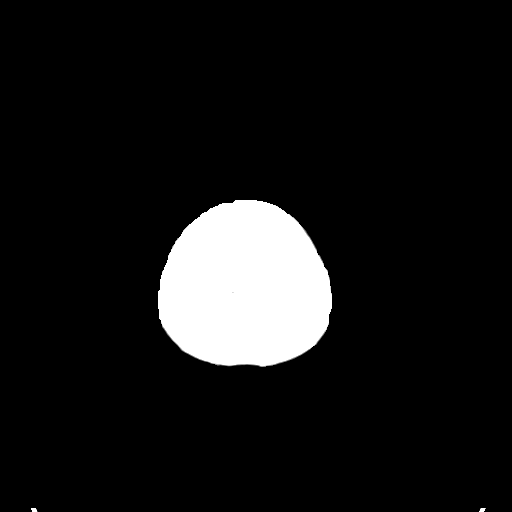
[im 27/29  bone]
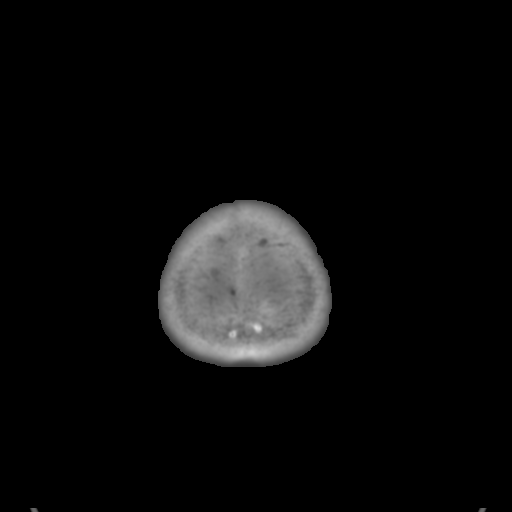

[Series 4: coronal soft tissue · coronal · 0.35mm/px · 3 of 63 slices shown]
[im 21/63  brain]
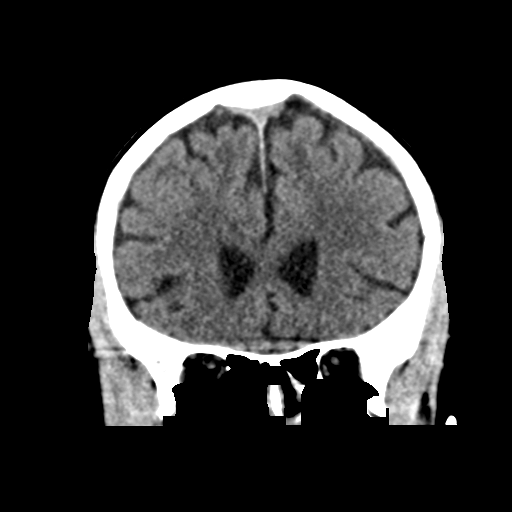
[im 28/63  brain]
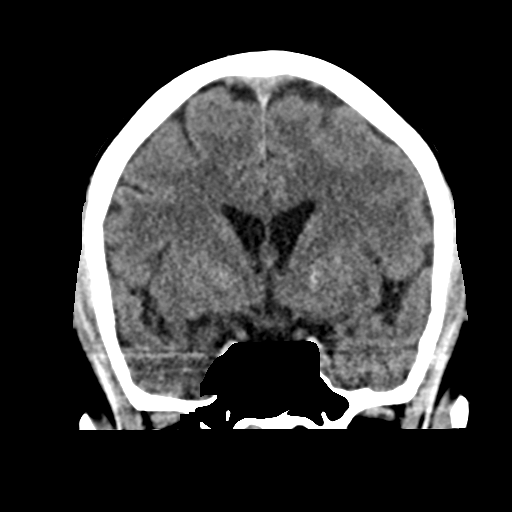
[im 35/63  brain]
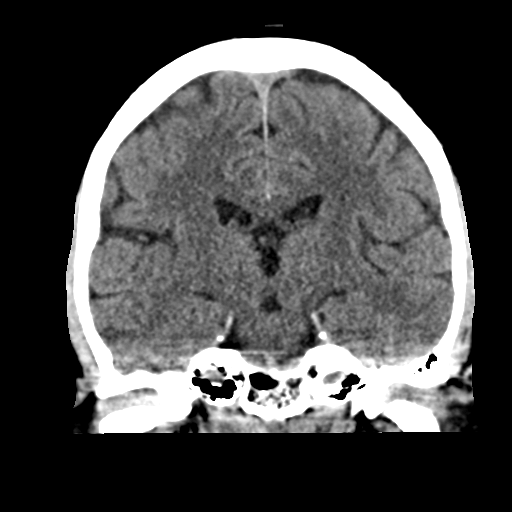

[Series 5: sagittal soft tissue · sagittal · 0.33mm/px · 3 of 52 slices shown]
[im 18/52  brain]
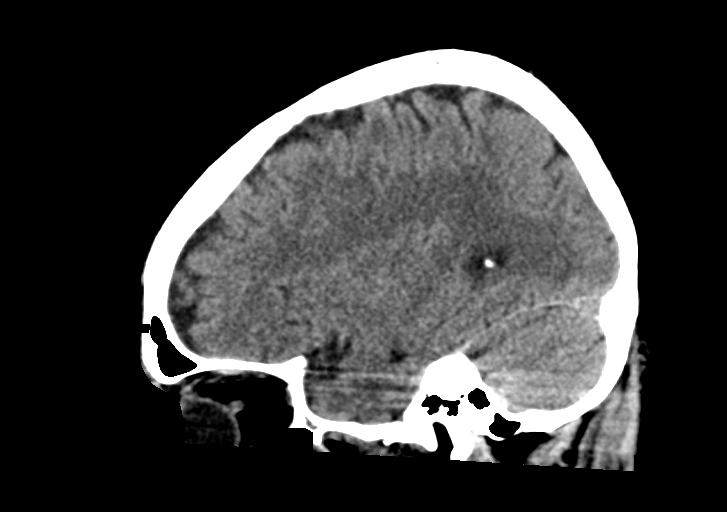
[im 26/52  brain]
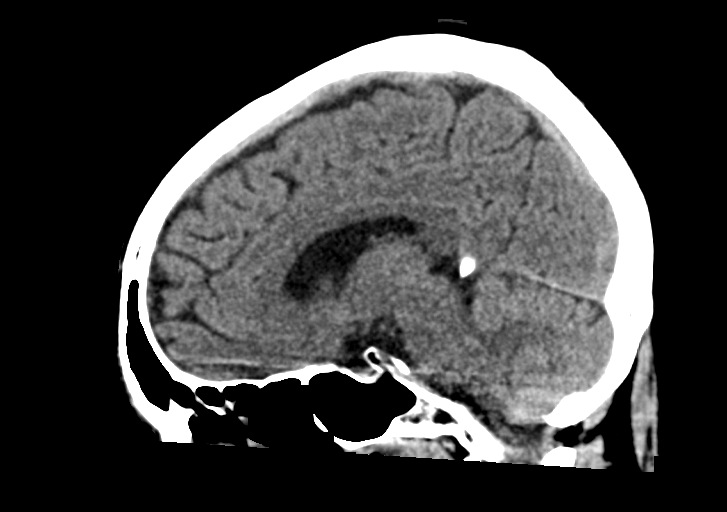
[im 35/52  brain]
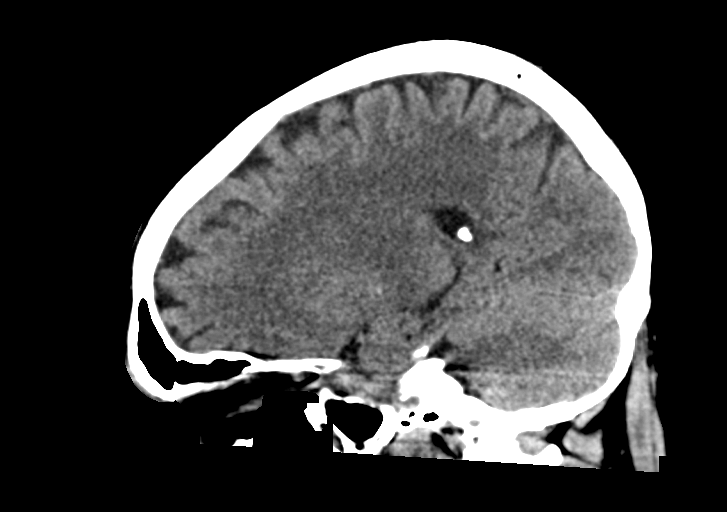

[15 of 46 positions shown; findings below may reference images not displayed]

FINDINGS: Brain: No midline shift, ventriculomegaly, mass effect, evidence of
mass lesion, intracranial hemorrhage or evidence of cortically based
acute infarction. Gray-white matter differentiation is within normal
limits throughout the brain.

Vascular: No suspicious intracranial vascular hyperdensity.

Skull: Osteopenia. Chronic right lamina papyracea fracture.
Otherwise negative.

Sinuses/Orbits: Bubbly opacity in the left maxillary sinus.
Otherwise visible paranasal sinuses and mastoids are well
pneumatized.

Other: Endotracheal tube and oral enteric tubes partially visible on
the scout view. Small volume fluid in the nasopharynx. Visualized
orbit soft tissues are within normal limits. Visualized scalp soft
tissues are within normal limits.
IMPRESSION: 1.  Normal noncontrast CT appearance of the brain.
2. Intubated. Mild fluid in the nasopharynx and left maxillary
sinus.

## 2018-07-04 IMAGING — DX DG CHEST 1V PORT
1 series · 1 of 1 positions shown · non-contrast
Comparison: Chest CT April 22, 2017 and chest x-rays April 22, 2017

ADDENDUM:
Results discussed with the patient's nurse.
CLINICAL DATA: History of CHF.  Follow-up pneumothorax.

EXAM:
PORTABLE CHEST 1 VIEW

[chest ap]
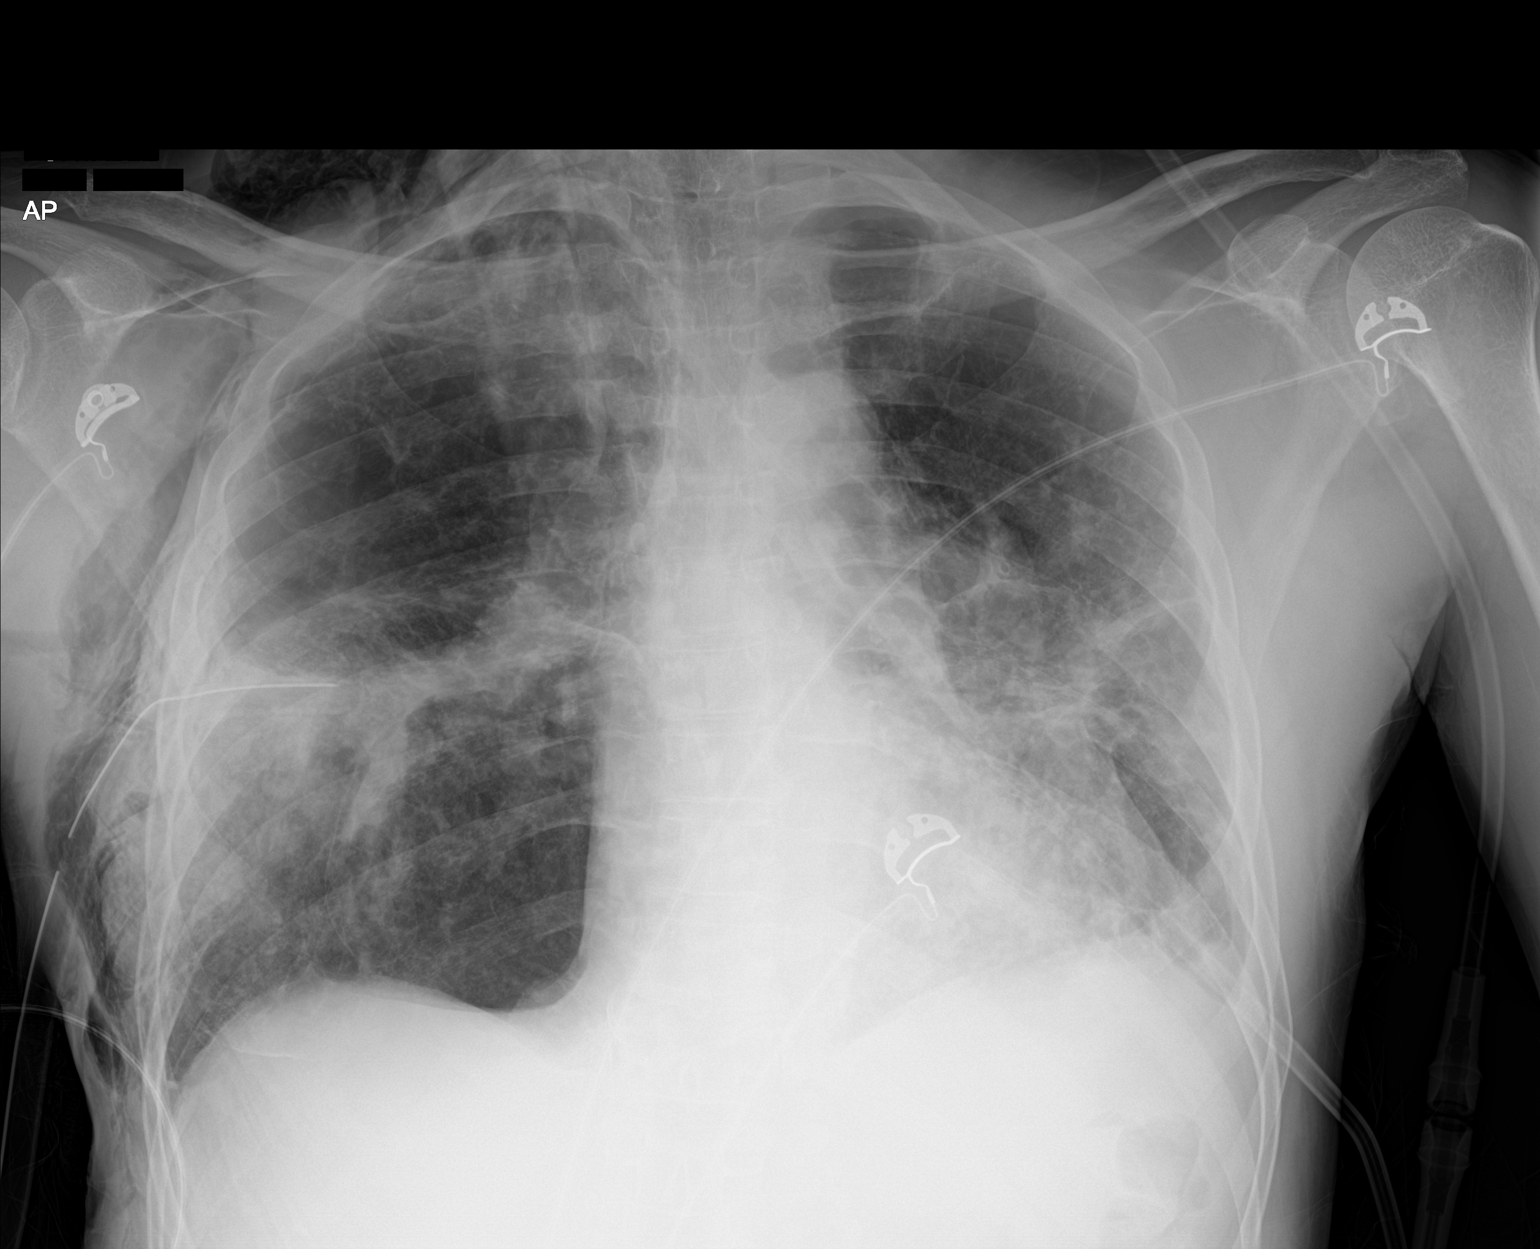

[1 of 1 positions shown; findings below may reference images not displayed]

FINDINGS: The right-sided chest tube has been pulled back in the interval. The
distal tip projects over the right mid lung with the side port now
outside the pleural space. There is increased air in the
subcutaneous tissues. There is a small right pneumothorax which is
unchanged since the CT scan from yesterday. Bullous changes as well
persist. Infiltrate in both lungs is essentially stable. The
cardiomediastinal silhouette is unchanged. The ET and NG tubes have
been removed. No other interval changes.
IMPRESSION: 1. The chest tube has been pulled back several cm in the interval
and the side port is now outside of the right pleural space with a
marked increase in air in the subcutaneous tissues of the right
chest and neck. The small right pneumothorax is stable since the
April 22, 2017 CT scan.
2. ET and NG tubes have been removed.
3. The bilateral pulmonary infiltrates are similar in the interval.

I will call the findings to the referring clinical team.

## 2018-07-04 IMAGING — DX DG CHEST 1V PORT
1 series · 1 of 1 positions shown · non-contrast
Comparison: 0687 hours today and earlier.

CLINICAL DATA: 51-year-old female with pulmonary sarcoidosis.
Intubated with right side pneumothorax. Chest tube
adjustment/replacement.

EXAM:
PORTABLE CHEST 1 VIEW

[chest ap]
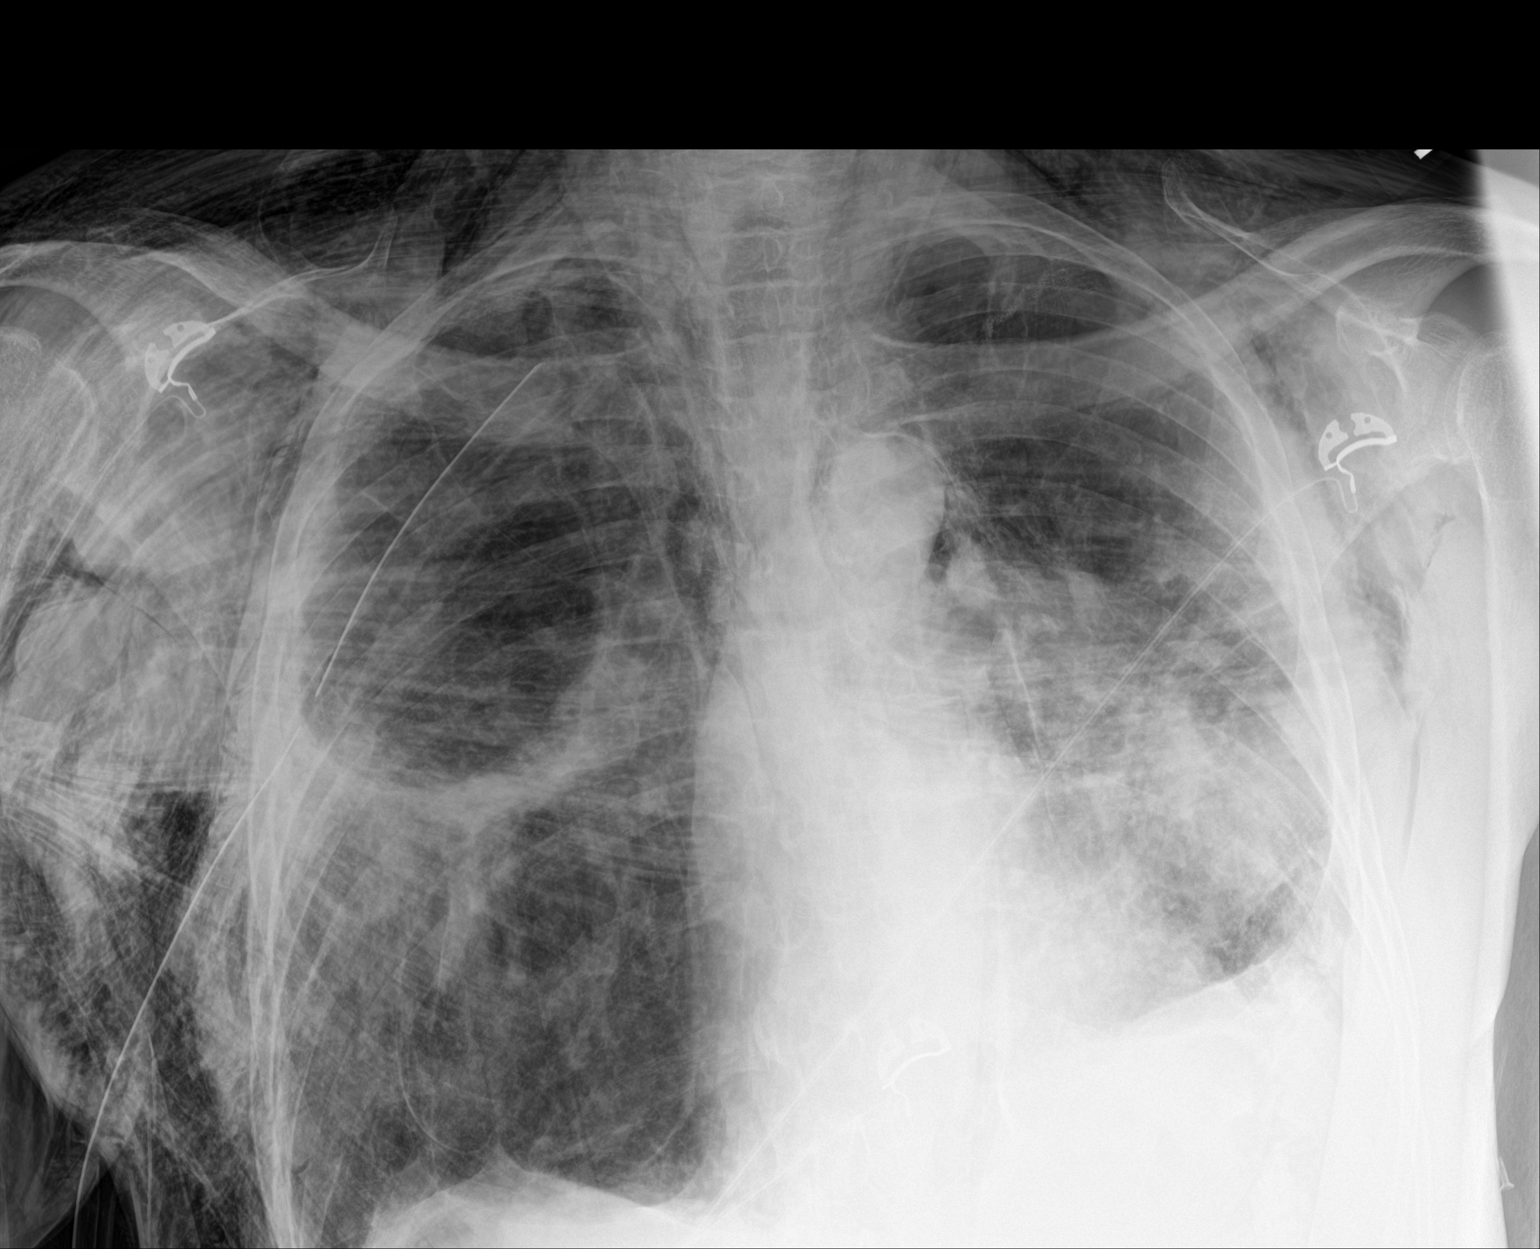

[1 of 1 positions shown; findings below may reference images not displayed]

FINDINGS: Portable AP semi upright view at 0414 hours. The right side chest
tube has been replaced or reposition and now courses to the right
lung apex with side hole projecting inside the pleural space.

Moderate to severe right greater than left chest wall and neck
subcutaneous emphysema has progressed since this morning. Evidence
of associated pneumomediastinum. This now skiers the right lung
pleural detail, but a small persistent right pneumothorax is
suspected.

Chronic lung architectural distortion greater on the left. Larger
lung volumes since this morning. Stable cardiac size and mediastinal
contours. No new pulmonary opacity.
IMPRESSION: 1. Repositioned or replaced right chest tube, now in satisfactory
position.
2. Progressed and now severe right greater than left chest wall and
neck subcutaneous gas since 0687 hours today. Evidence now of
associated pneumomediastinum.
3. Probable stable small right pneumothorax.
4. Chronic lung disease.  No new pulmonary opacity.

## 2018-07-04 IMAGING — DX DG CHEST 1V PORT
1 series · 2 of 2 positions shown · non-contrast
Comparison: Portable exam 4443 hours compared to 0969 hours

CLINICAL DATA: RIGHT pneumothorax

EXAM:
PORTABLE CHEST 1 VIEW

[Series 1: chest ap · 0.14mm/px · 2 of 2 slices shown]
[im 1/2]
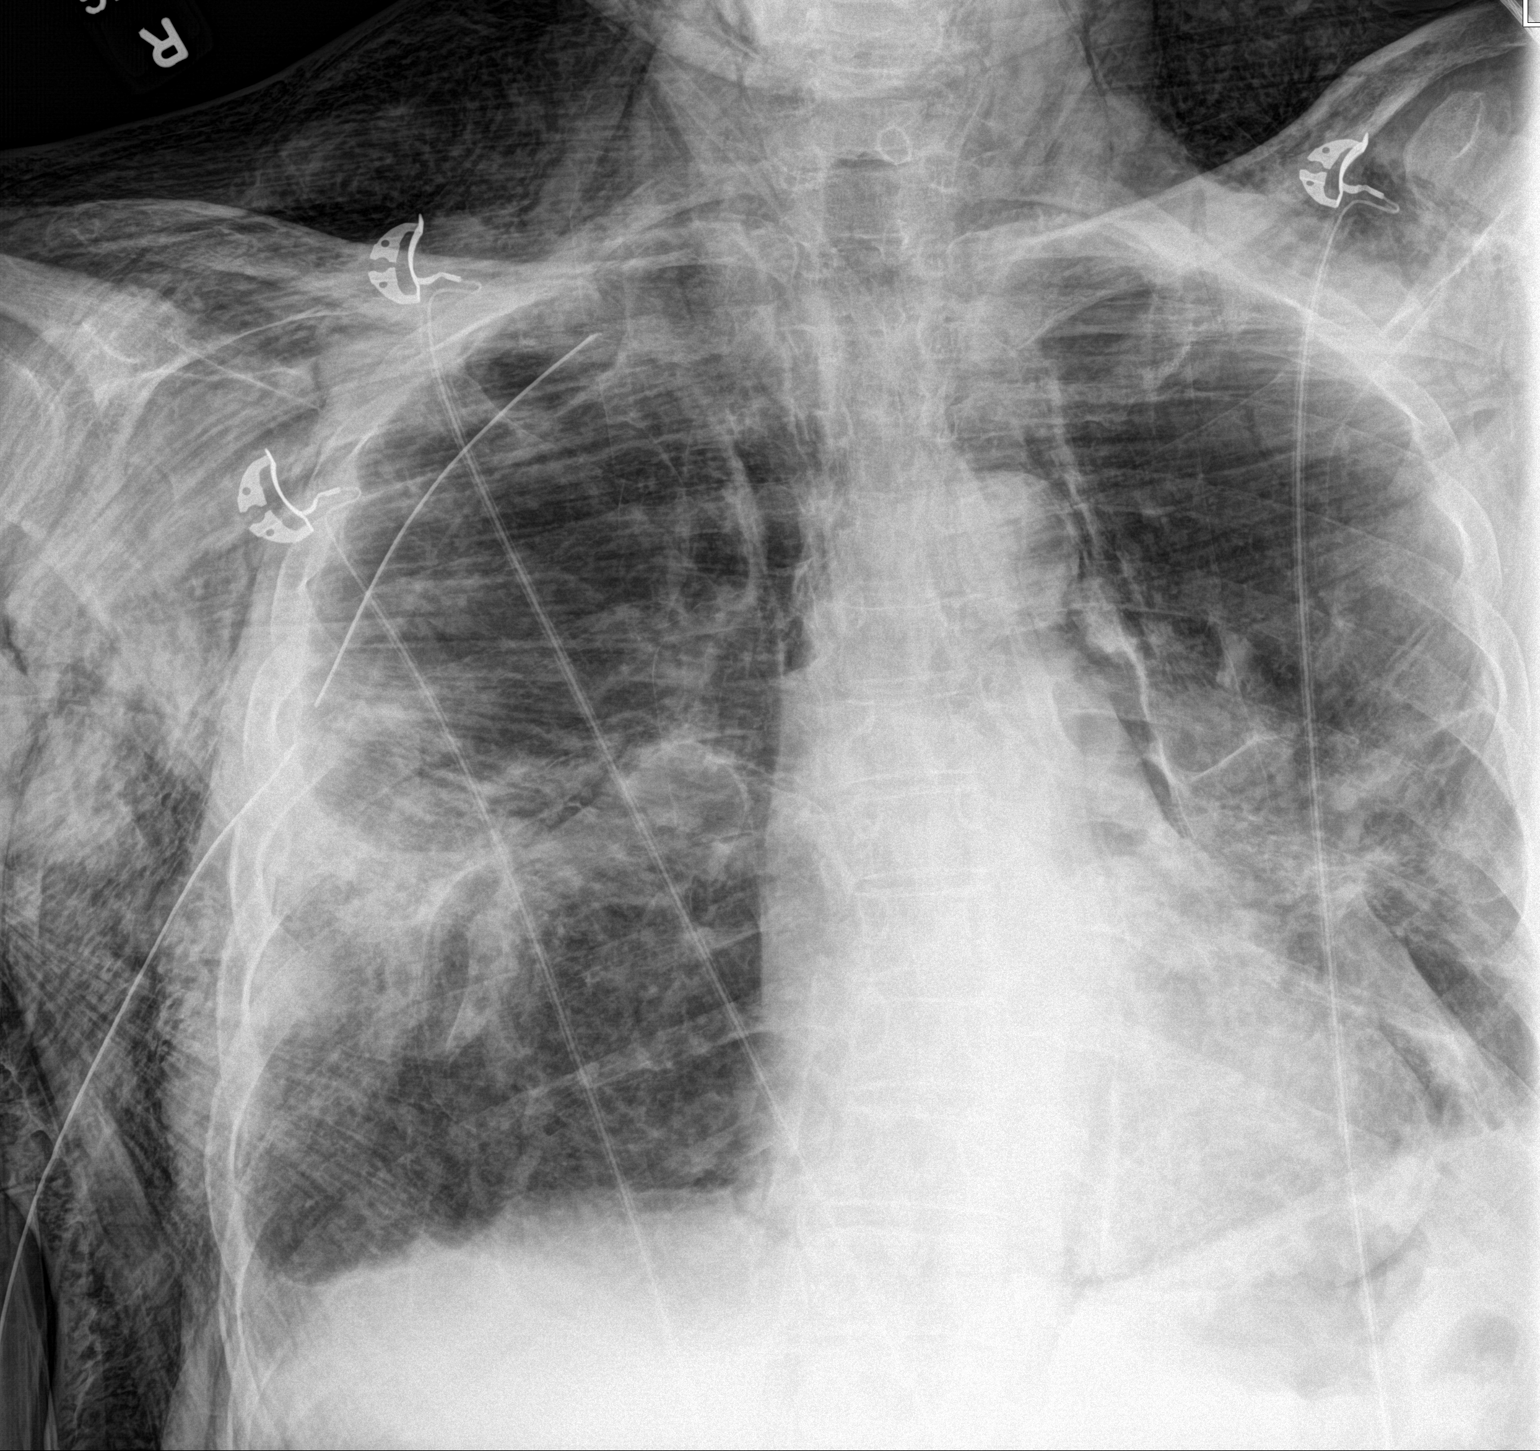
[im 2/2]
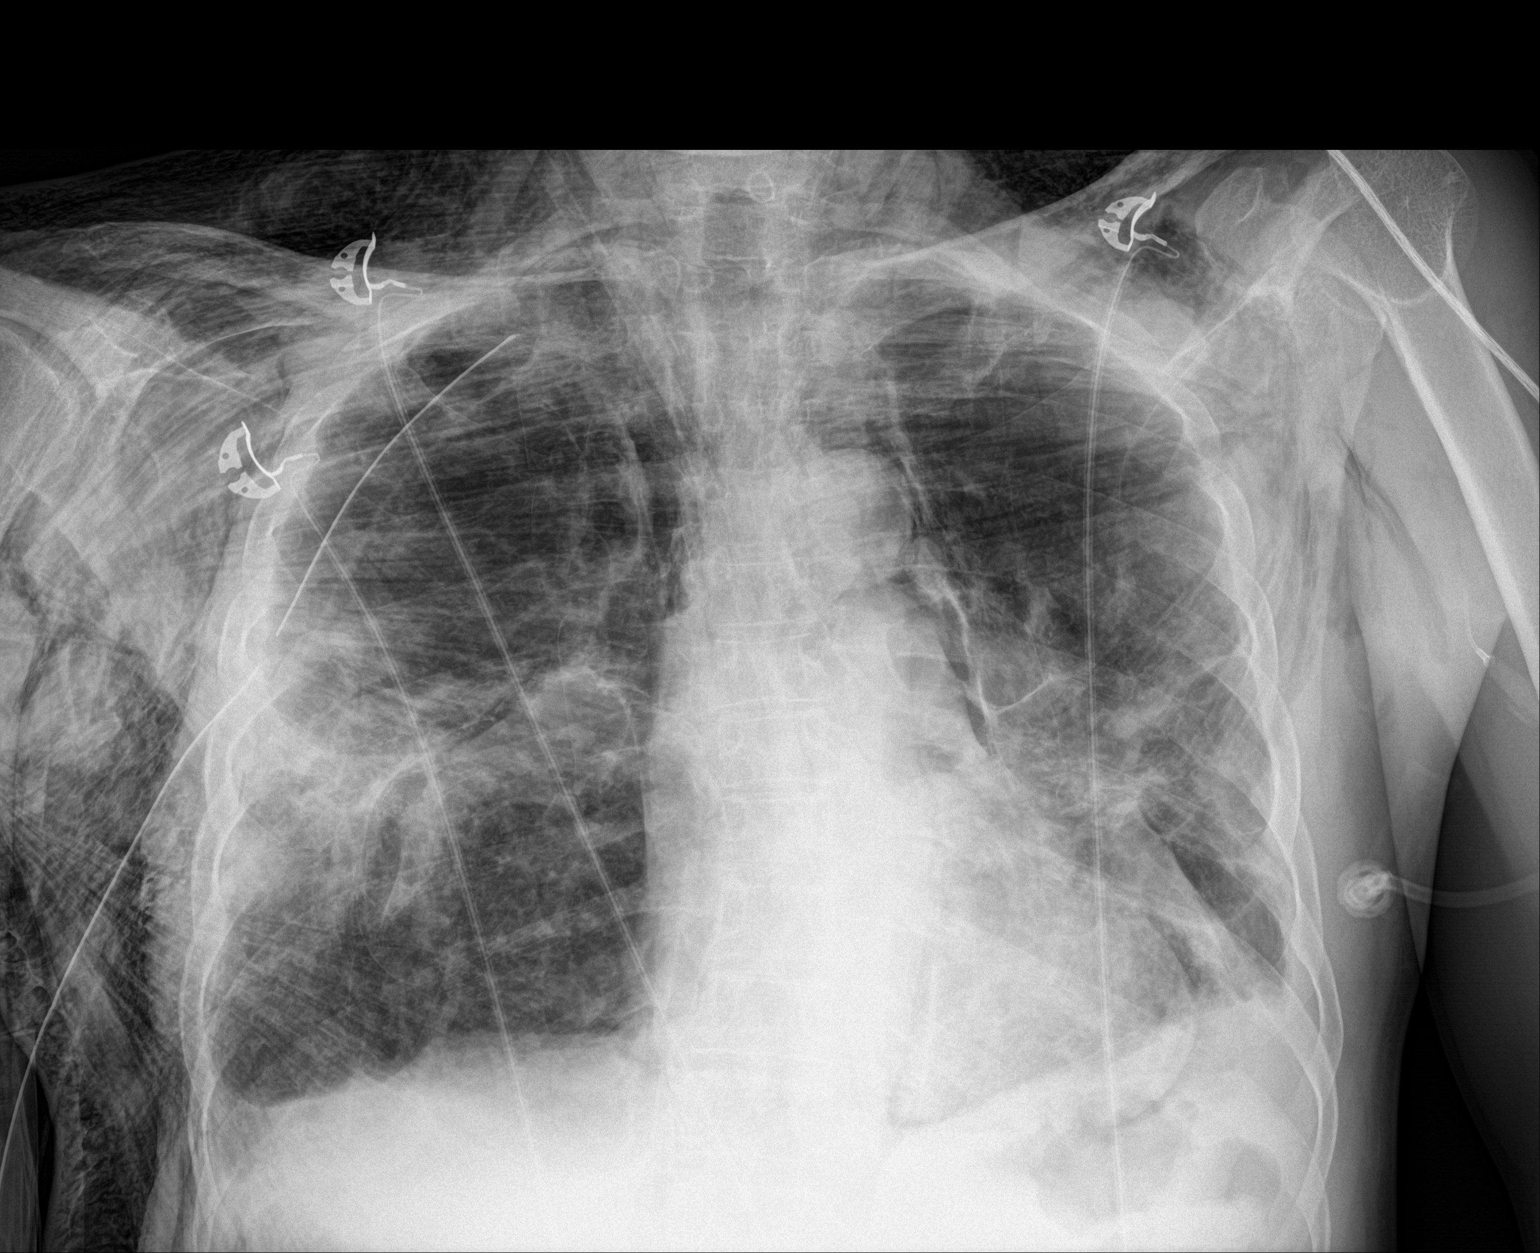

[2 of 2 positions shown; findings below may reference images not displayed]

FINDINGS: RIGHT thoracostomy tube unchanged.

Extensive chest wall emphysema bilaterally into the cervical
regions.

Pneumomediastinum persists.

Persistent lucency at the medial upper RIGHT hemithorax could
represent medial pneumothorax or pneumomediastinum.

Linear lucency extends into the mid RIGHT lung likely representing
loculated pneumothorax within a fissure.

Atelectasis at RIGHT mid lung unchanged.

Stable heart size.

Subsegmental atelectasis LEFT base.
IMPRESSION: Extensive chest wall emphysema.

Probable pneumomediastinum though a linear gas collection extends
into the mid RIGHT lung suspect loculated pneumothorax within a
RIGHT fissure.

## 2018-07-05 IMAGING — DX DG CHEST 1V PORT
1 series · 1 of 1 positions shown · non-contrast
Comparison: Study obtained earlier in the day

CLINICAL DATA: Chest pain and shortness of breath

EXAM:
PORTABLE CHEST 1 VIEW

[chest ap]
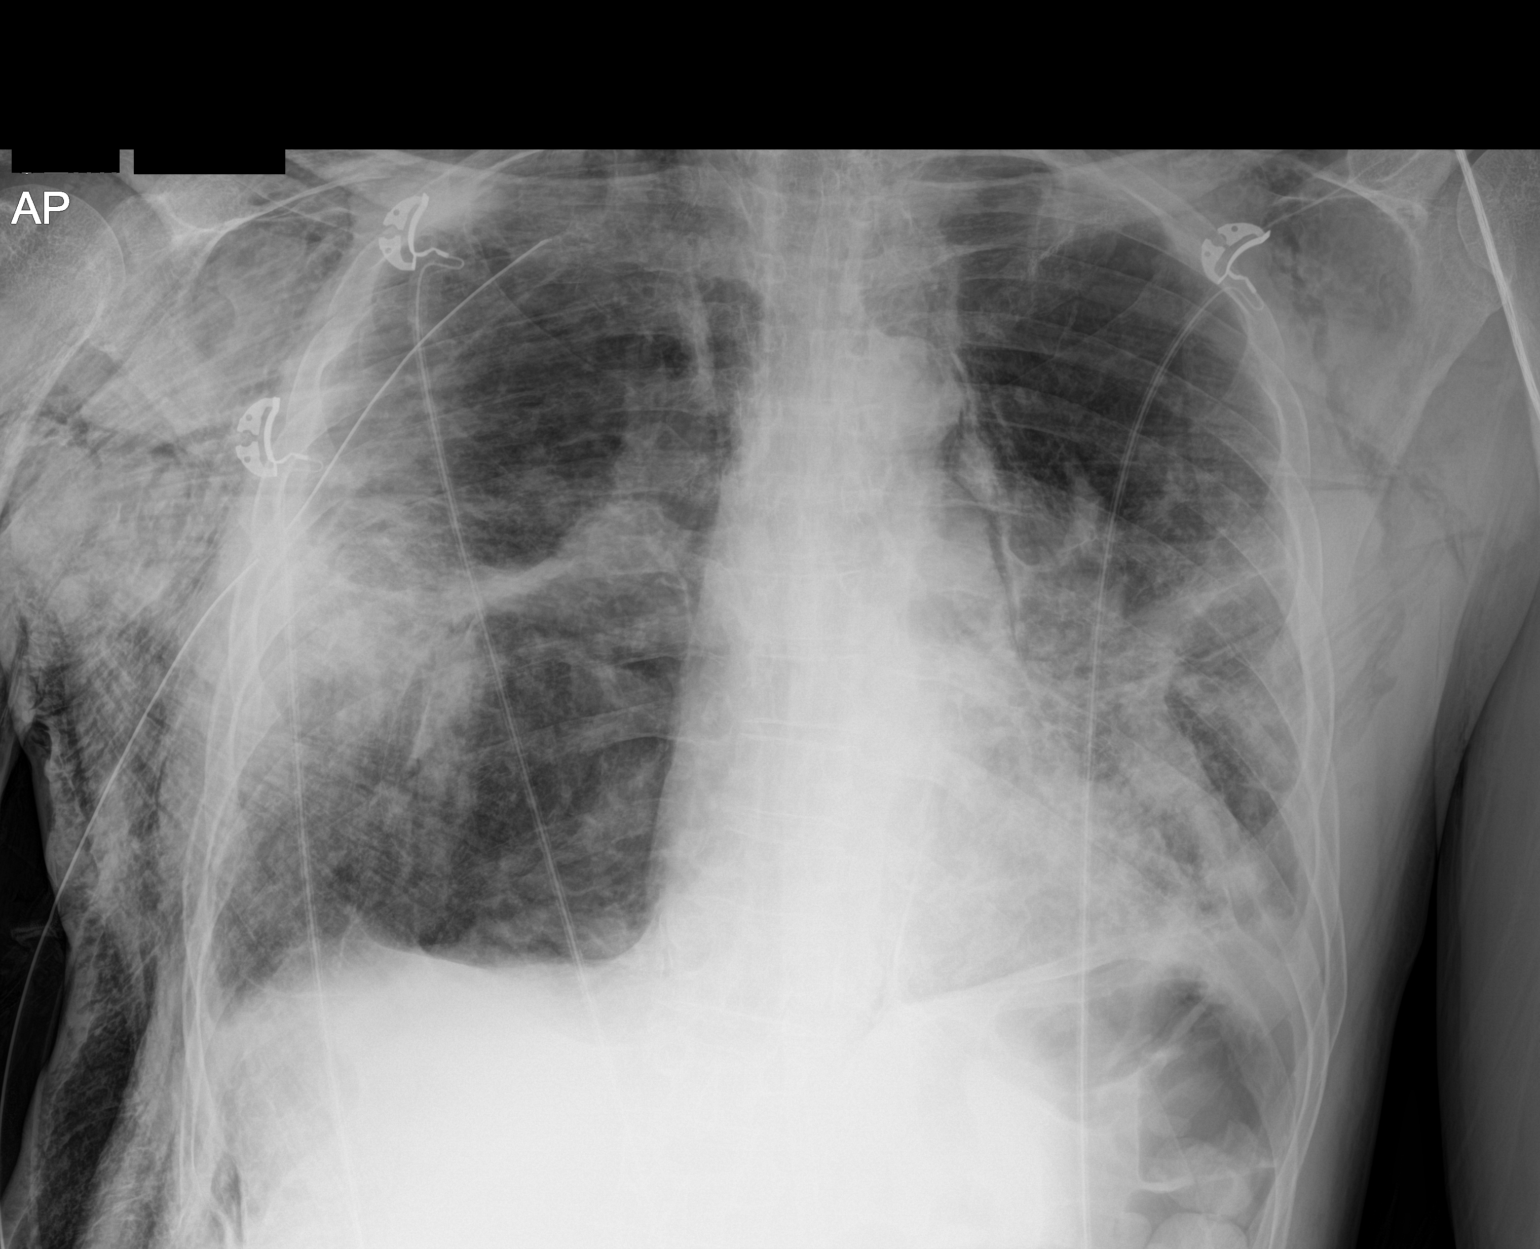

[1 of 1 positions shown; findings below may reference images not displayed]

FINDINGS: Chest tube on the right is unchanged in position. No pneumothorax is
currently evident. There is, however, pneumomediastinum as well as
extensive subcutaneous emphysema. Areas of mid lower lung zone
infiltrate remain without change. No new opacity. Heart size normal.
Pulmonary vascular within normal limits. No adenopathy. No bone
lesions.
IMPRESSION: No pneumothorax evident with chest tube unchanged in position.
Extensive subcutaneous air bilaterally as well as pneumomediastinum
evident. Areas of consolidation in the mid lower lung zones remain
stable. No new opacity. No change in cardiac silhouette.

## 2018-07-05 IMAGING — DX DG CHEST 1V PORT
1 series · 1 of 1 positions shown · non-contrast
Comparison: 04/23/2017

CLINICAL DATA: Follow-up right pneumothorax. Sarcoidosis. Acute
respiratory failure with hypoxia.

EXAM:
PORTABLE CHEST 1 VIEW

[chest ap]
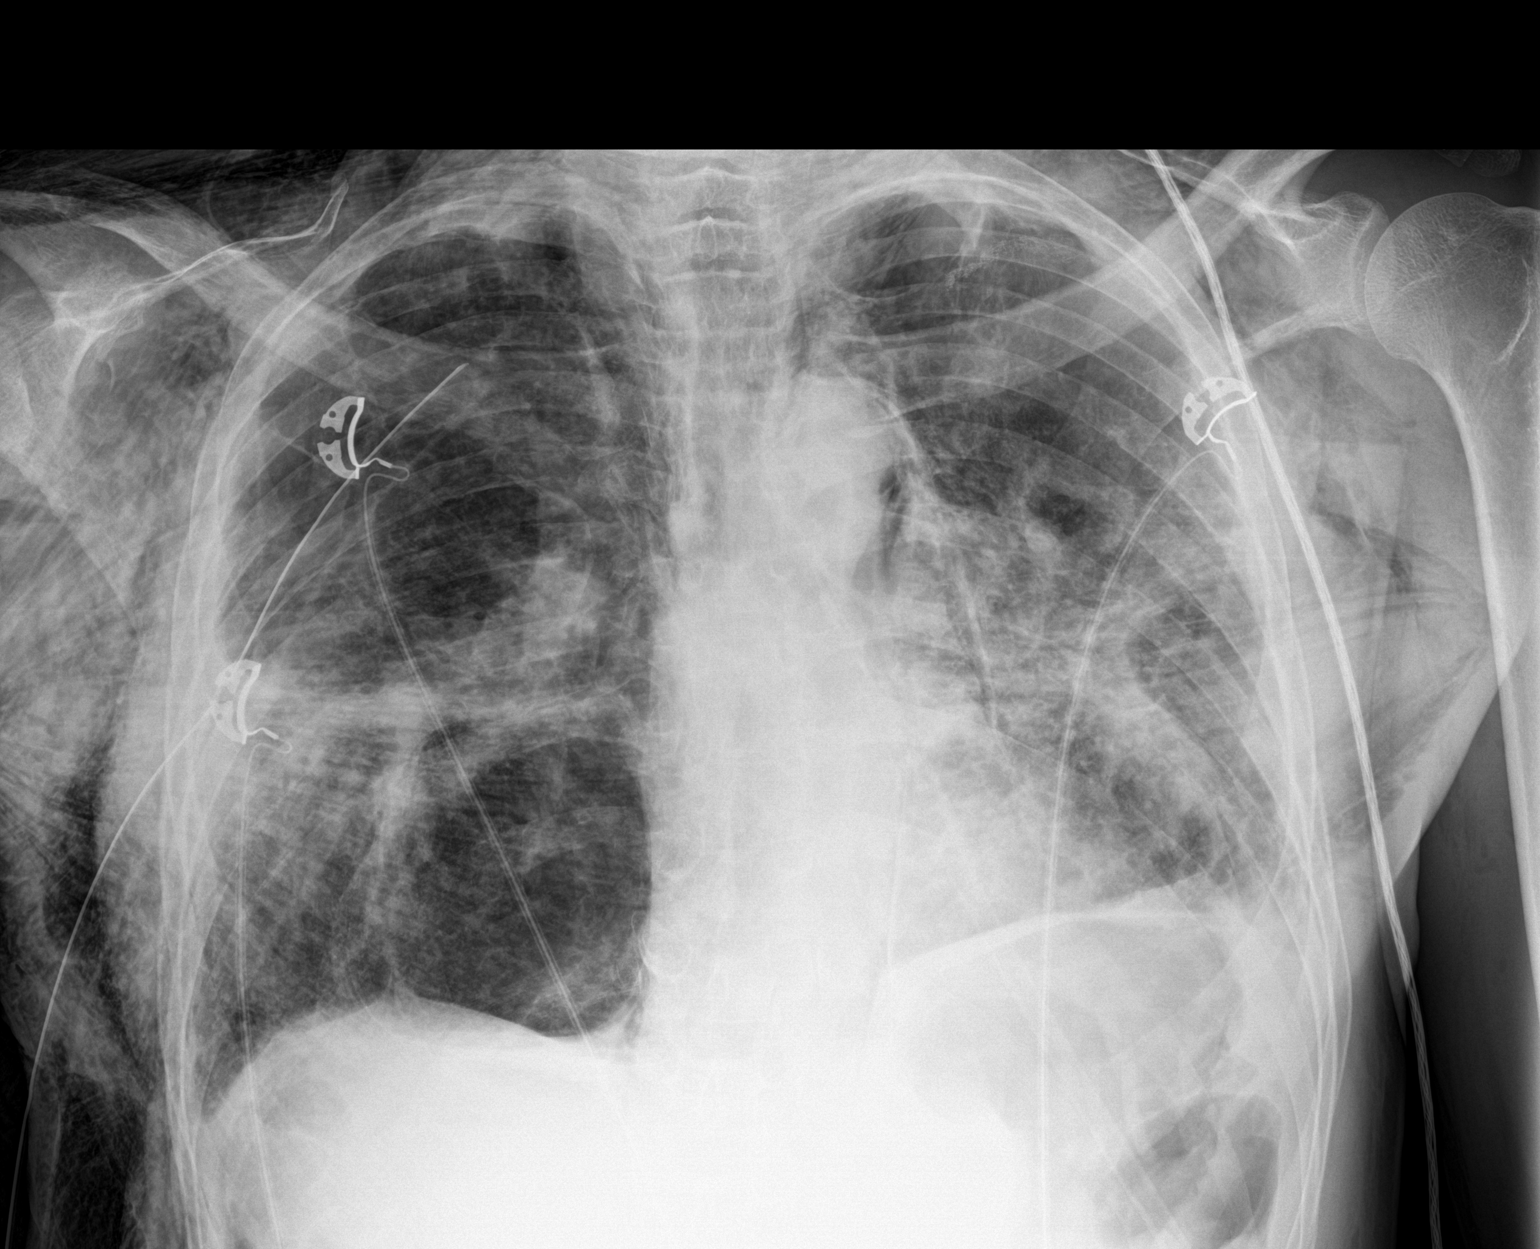

[1 of 1 positions shown; findings below may reference images not displayed]

FINDINGS: Right chest tube remains in place. A small less than 5% right apical
pneumothorax is seen on today's study. Pneumomediastinum is
unchanged. Extensive bilateral chest wall subcutaneous emphysema is
again seen.

Heart size remains within normal limits. Pulmonary infiltrates in
both midlung zones shows no significant change.
IMPRESSION: Small less than 5% right apical pneumothorax, with right chest tube
remaining in place.

Stable pneumomediastinum extensive subcutaneous chest wall
emphysema.

## 2018-07-06 IMAGING — DX DG CHEST 1V PORT
1 series · 1 of 1 positions shown · non-contrast
Comparison: One-view chest x-ray 04/24/2017

CLINICAL DATA: Pneumothorax.

EXAM:
PORTABLE CHEST 1 VIEW

[chest ap]
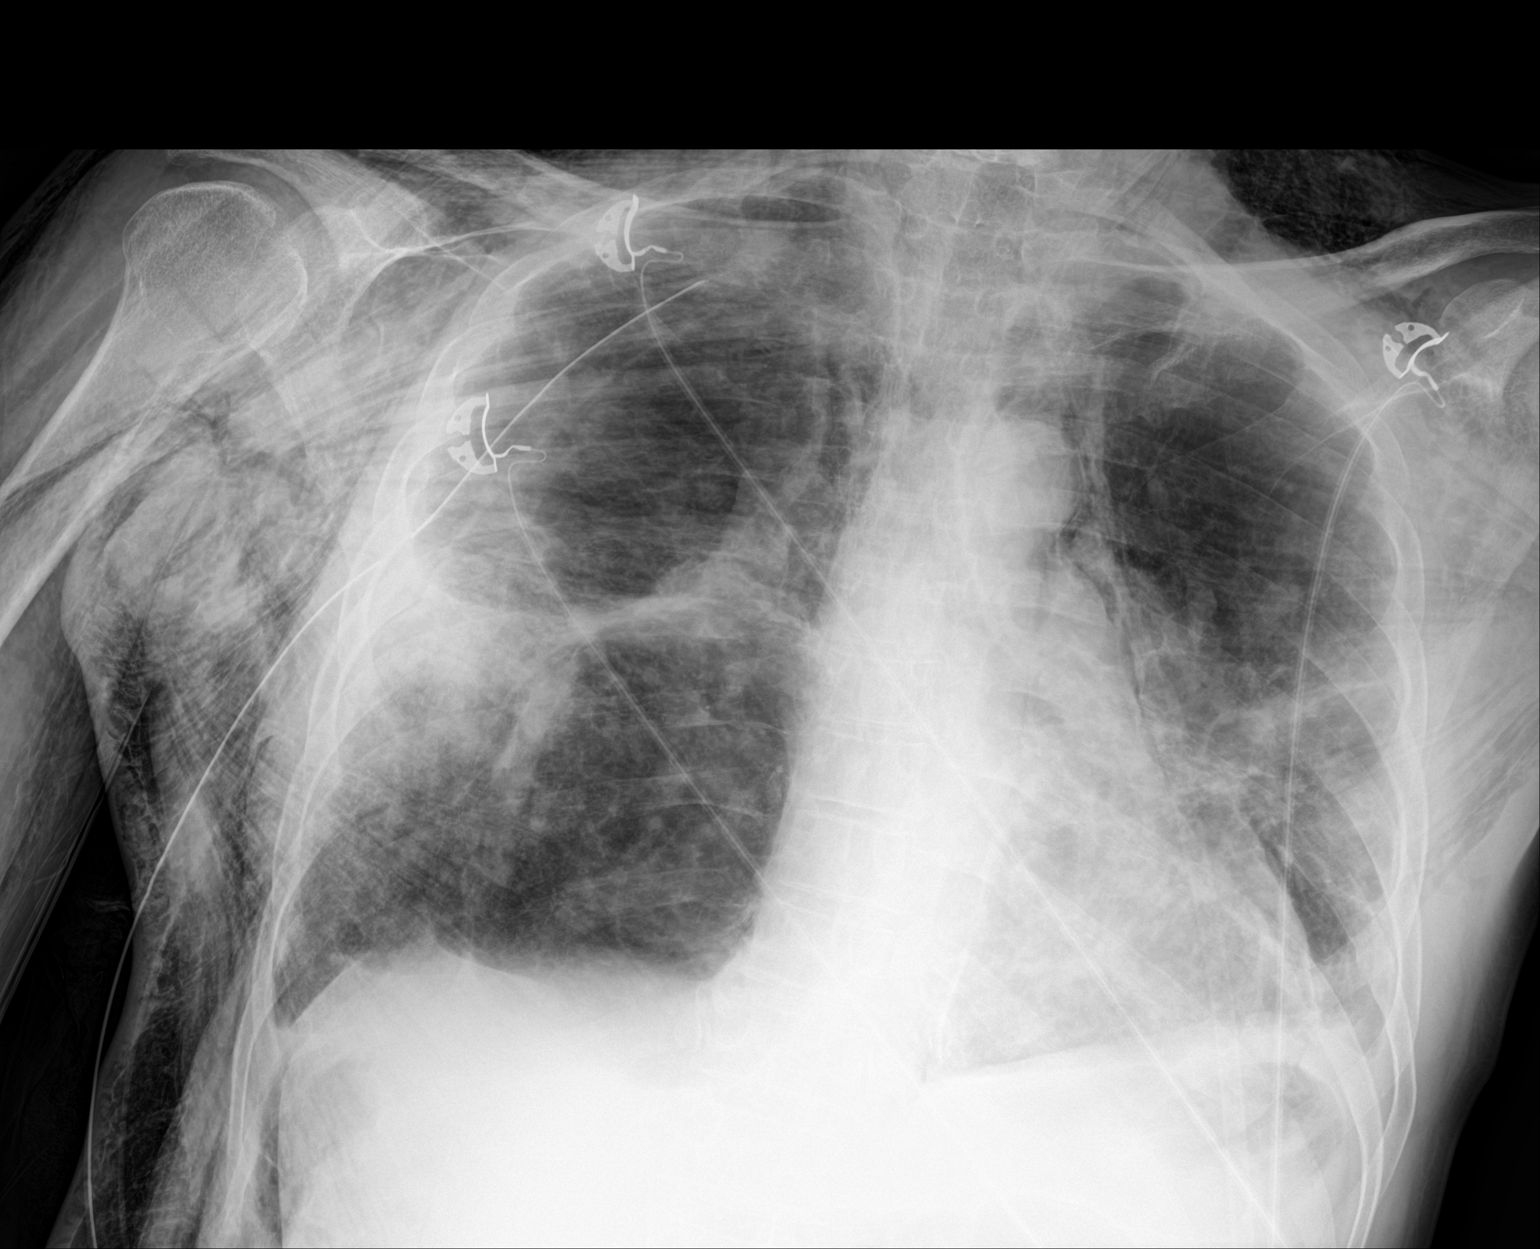

[1 of 1 positions shown; findings below may reference images not displayed]

FINDINGS: A right-sided chest tube is in place. No significant residual
pneumothorax is present. Extensive subcutaneous emphysema is again
seen bilaterally. Pneumomediastinum persists. Bilateral airspace
opacities are unchanged. The heart size is normal.
IMPRESSION: 1. No significant residual recurrent pneumothorax.
2. Stable significant subcutaneous emphysema.
3. Pneumomediastinum.

## 2018-07-07 IMAGING — DX DG CHEST 1V PORT
1 series · 1 of 1 positions shown · non-contrast
Comparison: 04/25/2017

CLINICAL DATA: Pneumothorax

EXAM:
PORTABLE CHEST 1 VIEW

[chest ap]
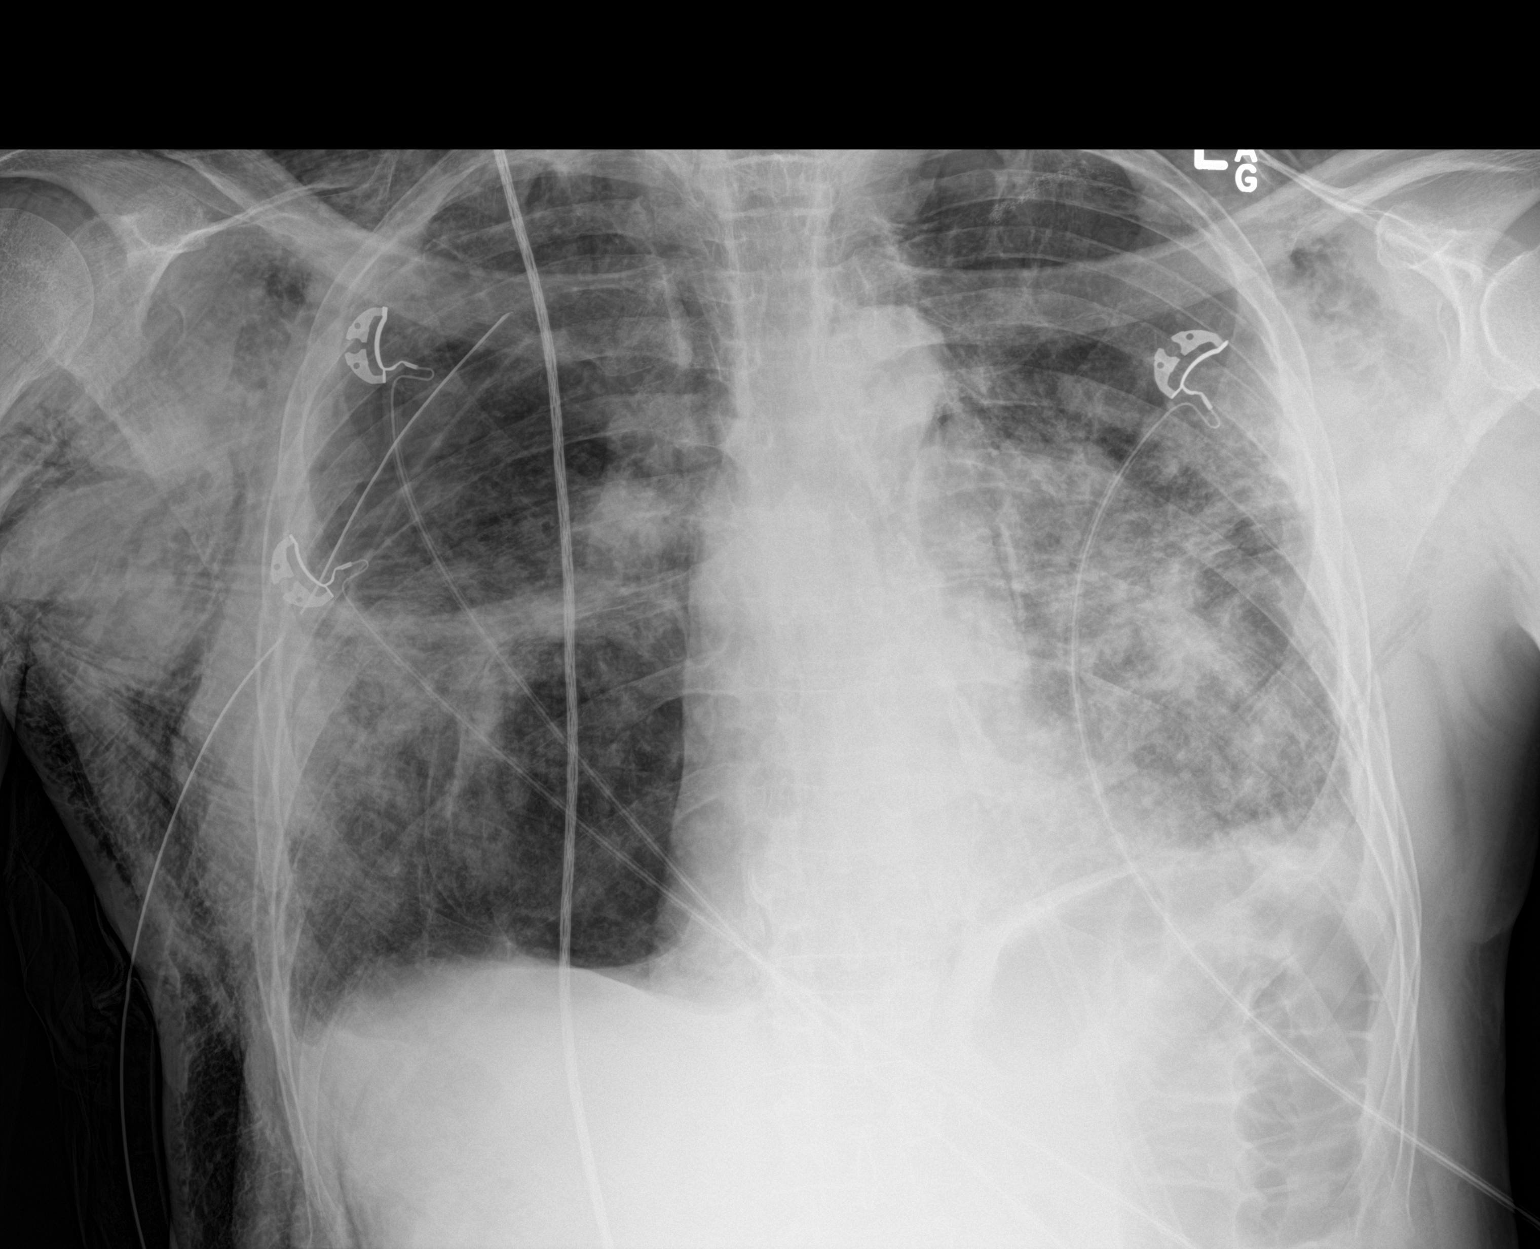

[1 of 1 positions shown; findings below may reference images not displayed]

FINDINGS: Right chest tube remains in place. Small right apical pneumothorax
less than 10%. Diffuse subcutaneous emphysema unchanged.

Bilateral airspace disease unchanged. Pulmonary hyperinflation.
Small pleural effusions bilaterally.
IMPRESSION: Small right apical pneumothorax.

Diffuse bilateral airspace disease unchanged.

## 2018-07-08 IMAGING — DX DG CHEST 1V PORT
1 series · 1 of 1 positions shown · non-contrast
Comparison: 04/26/2017

CLINICAL DATA: Acute respiratory failure

EXAM:
PORTABLE CHEST 1 VIEW

[chest ap]
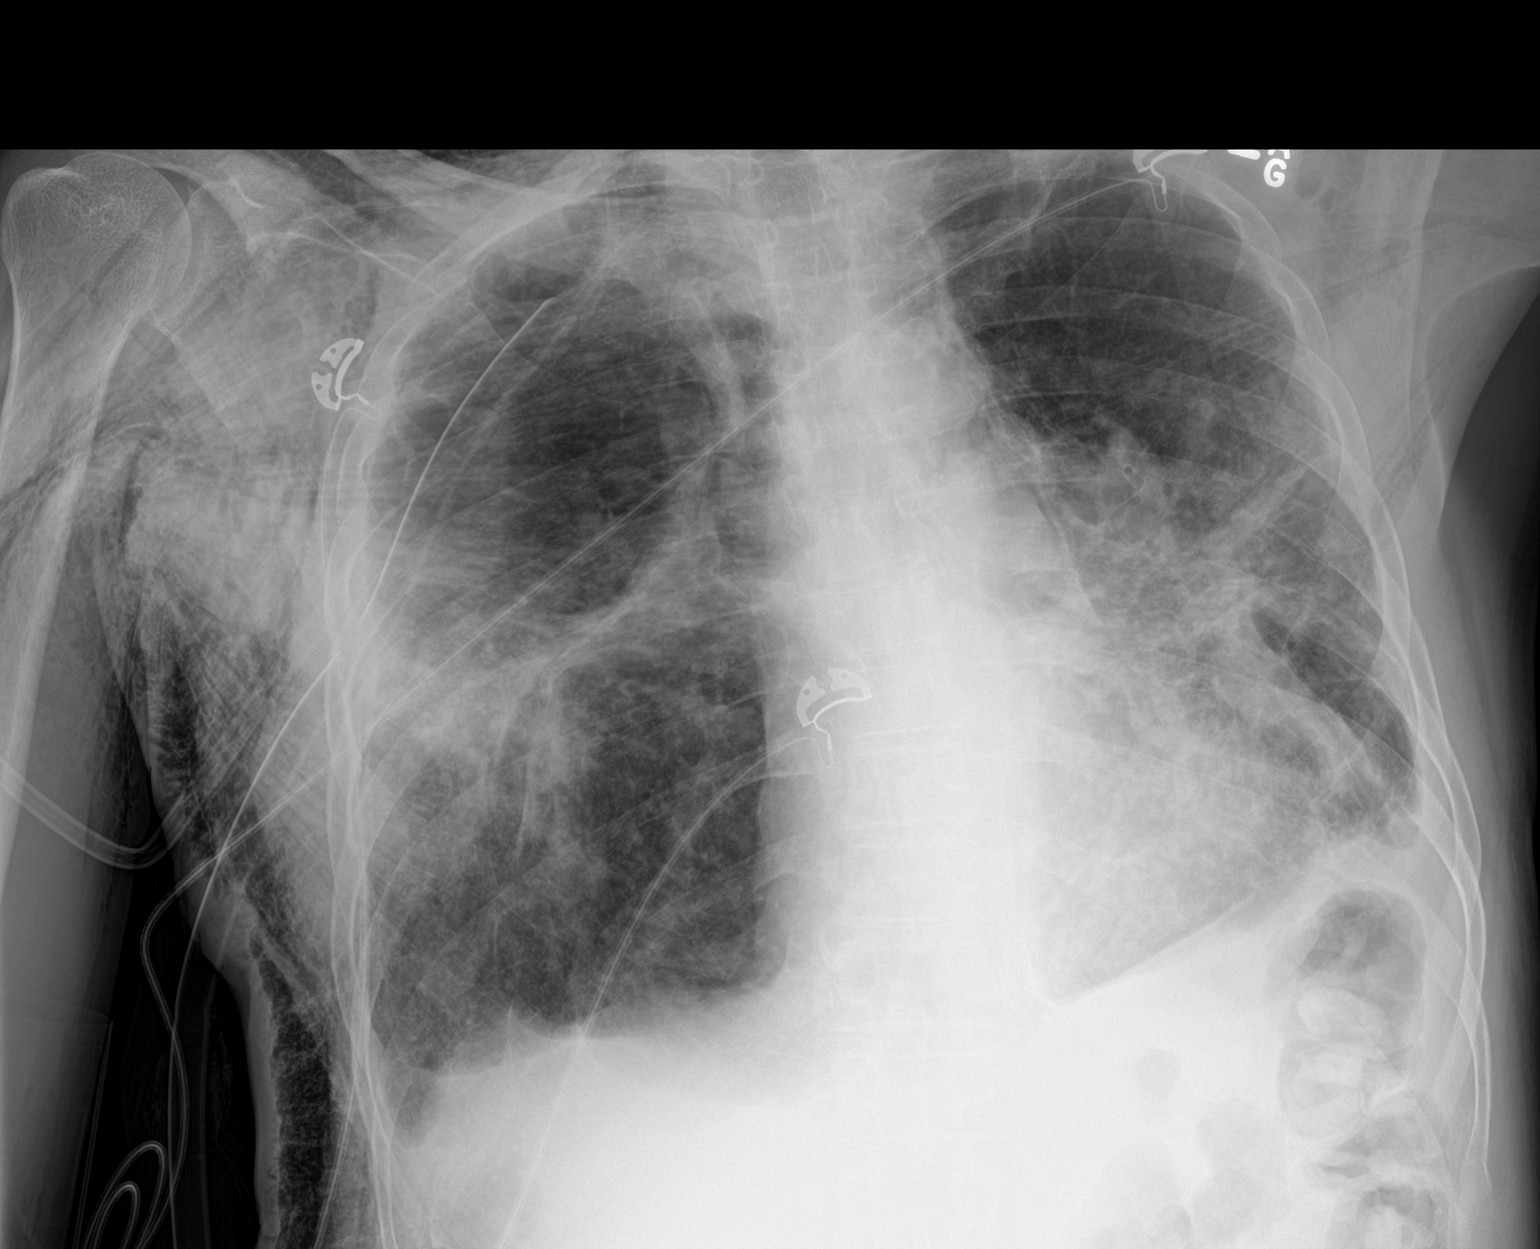

[1 of 1 positions shown; findings below may reference images not displayed]

FINDINGS: Cardiac shadow is within normal limits. Patchy infiltrates are again
seen throughout both lungs. Considerable subcutaneous emphysema is
noted. Stable right pneumothorax is seen with chest tube in place.
Proximal side port lies at the margin of the ribcage. No bony
abnormality
IMPRESSION: Stable right-sided pneumothorax.

Bilateral infiltrates stable from the prior exam.

## 2018-07-09 IMAGING — DX DG CHEST 1V PORT
1 series · 1 of 1 positions shown · non-contrast
Comparison: 04/27/2017.

CLINICAL DATA: Follow-up RIGHT pneumothorax.

EXAM:
PORTABLE CHEST 1 VIEW

[chest ap]
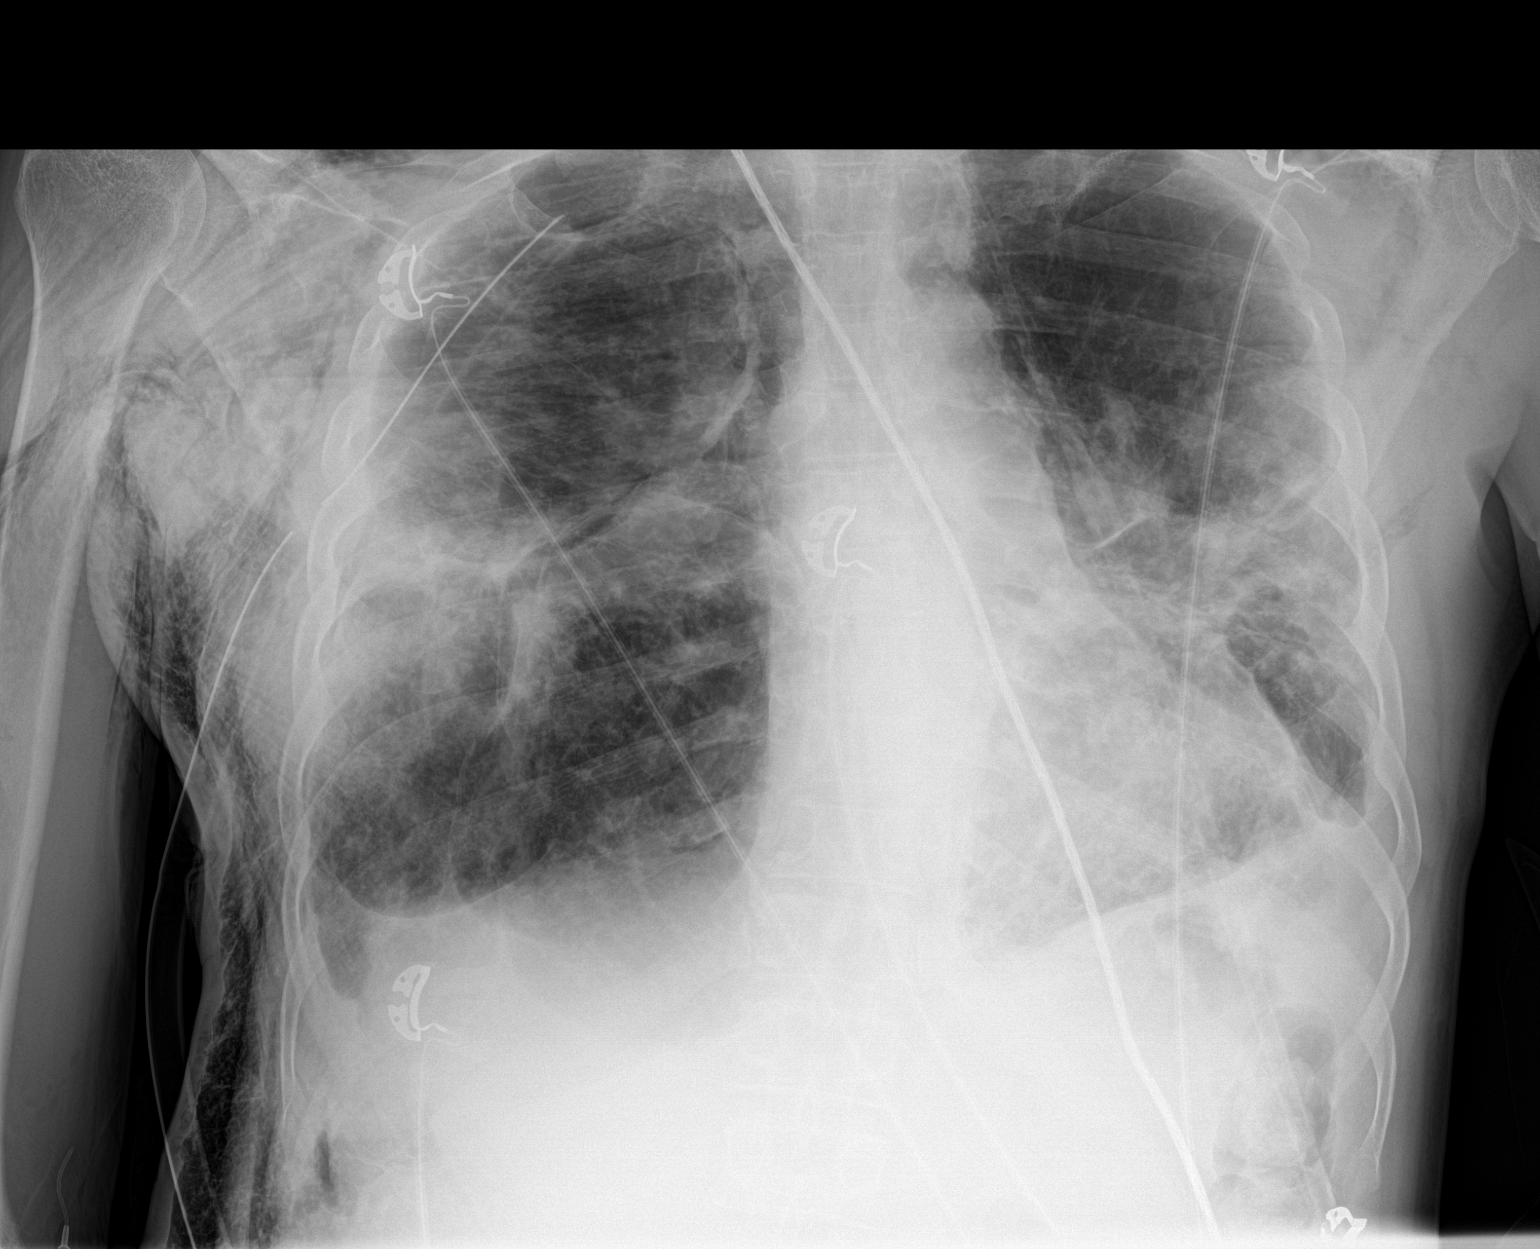

[1 of 1 positions shown; findings below may reference images not displayed]

FINDINGS: A RIGHT thoracostomy tube is noted, with side hole now external to
the ribs.

A tiny RIGHT apical pneumothorax is noted, decreased.

Diffuse RIGHT subcutaneous emphysema has slightly decreased.

Bilateral and confluent opacities are again noted.

No other significant change.
IMPRESSION: 1. RIGHT thoracostomy tube, with side hole now external to the ribs.
Decreased tiny RIGHT apical pneumothorax and slightly decreased
diffuse RIGHT subcutaneous emphysema.
2. No other significant change.

## 2018-07-10 IMAGING — DX DG CHEST 1V PORT
1 series · 1 of 1 positions shown · non-contrast
Comparison: April 28, 2017

CLINICAL DATA: Respiratory failure

EXAM:
PORTABLE CHEST 1 VIEW

[chest ap]
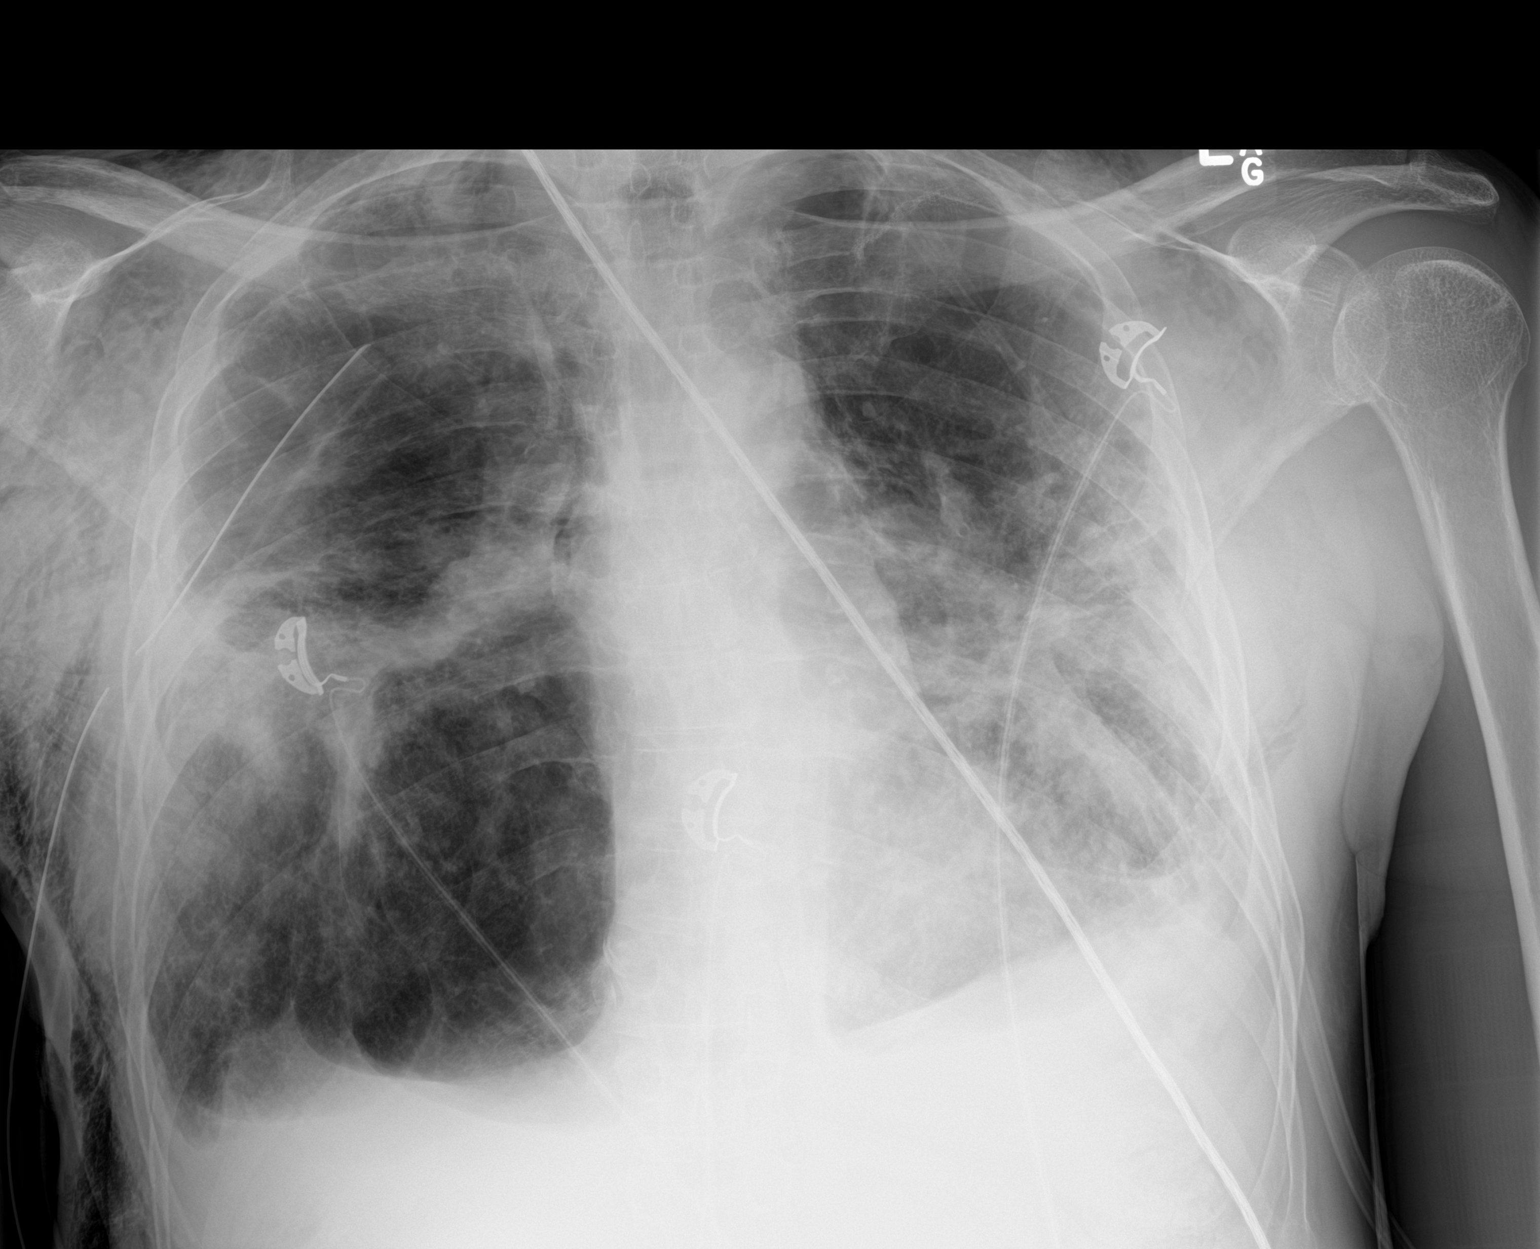

[1 of 1 positions shown; findings below may reference images not displayed]

FINDINGS: Chest tube is again noted on the right with the side port slightly
lateral to the pleural space on the right. There is a small apical
and lateral pneumothorax on the right without appreciable tension
component. There is extensive subcutaneous air on the right.

There is consolidation in the right mid lung region with small right
pleural effusion. There is consolidation throughout the left mid
lower lung zones with small left pleural effusion. Heart size
normal. Pulmonary vascularity appears stable and grossly
unremarkable. No adenopathy is evident. No bone lesions.
IMPRESSION: Chest tube present on the right with side port laterally positioned,
stable. Small pneumothorax on the right without tension component.
Areas of consolidation in both lungs with small pleural effusions
bilaterally. No new opacity. Stable cardiac silhouette. There
remains extensive subcutaneous air, more severe on the right than on
the left.

## 2018-07-11 IMAGING — DX DG CHEST 1V PORT
1 series · 1 of 1 positions shown · non-contrast
Comparison: 04/29/2017

CLINICAL DATA: Acute respiratory failure.

EXAM:
PORTABLE CHEST 1 VIEW

[chest ap]
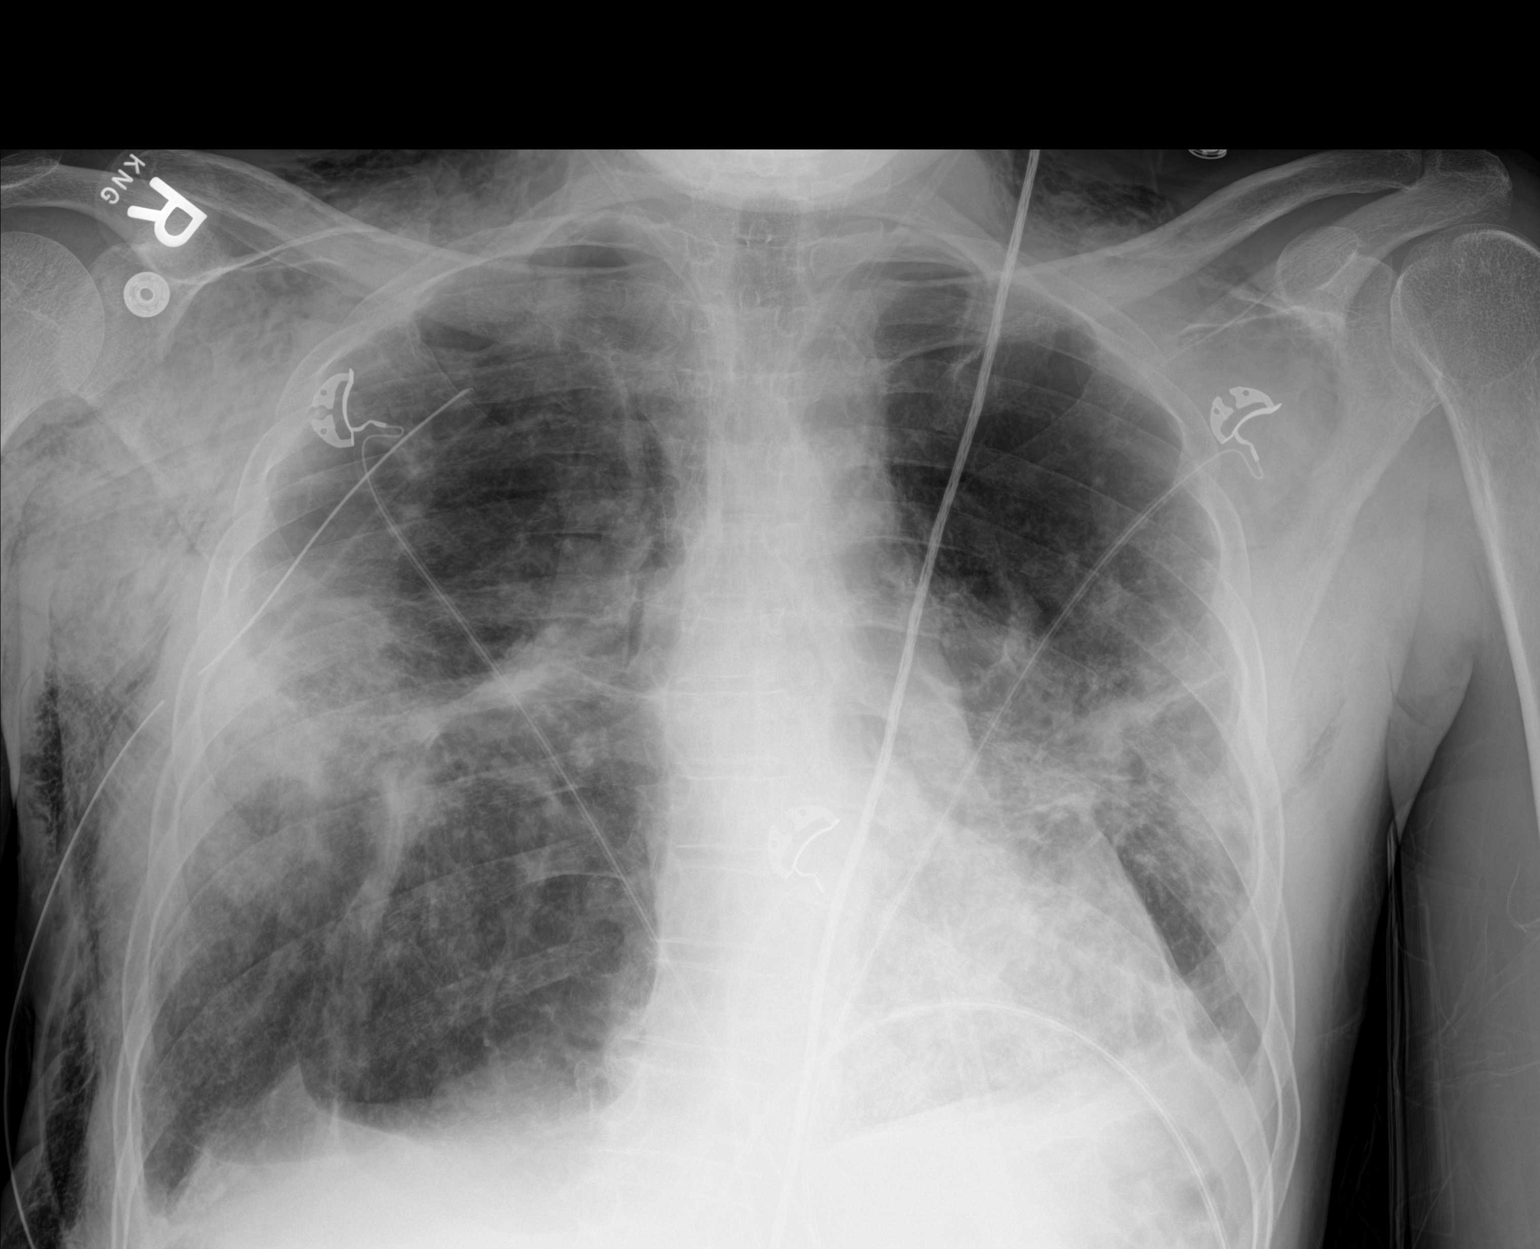

[1 of 1 positions shown; findings below may reference images not displayed]

FINDINGS: The cardiomediastinal silhouette is unchanged with known underlying
bilateral hilar lymphadenopathy related to sarcoidosis. A right
chest tube remains in place with side hole again projecting just
outside the lateral costal margin. There is likely a persistent tiny
right-sided pneumothorax though it is less conspicuous than on the
prior study. Architectural distortion is again seen in both lungs
with a large cavitary lesion noted on the right. Right midlung
consolidation is unchanged. Consolidation in the left mid and lower
lung has improved. There are persistent small bilateral pleural
effusions. Extensive soft tissue emphysema is again seen throughout
the right chest wall and bilateral lower neck.
IMPRESSION: 1. Improvement of left lung consolidation.
2. Unchanged right midlung consolidation and small pleural
effusions.
3. Unchanged right chest tube, likely with a persistent tiny right
pneumothorax.

## 2018-07-13 IMAGING — DX DG CHEST 1V PORT
1 series · 1 of 1 positions shown · non-contrast
Comparison: Portable film earlier today.

CLINICAL DATA: RIGHT-sided chest tube insertion, coughing.

EXAM:
PORTABLE CHEST 1 VIEW

[chest ap]
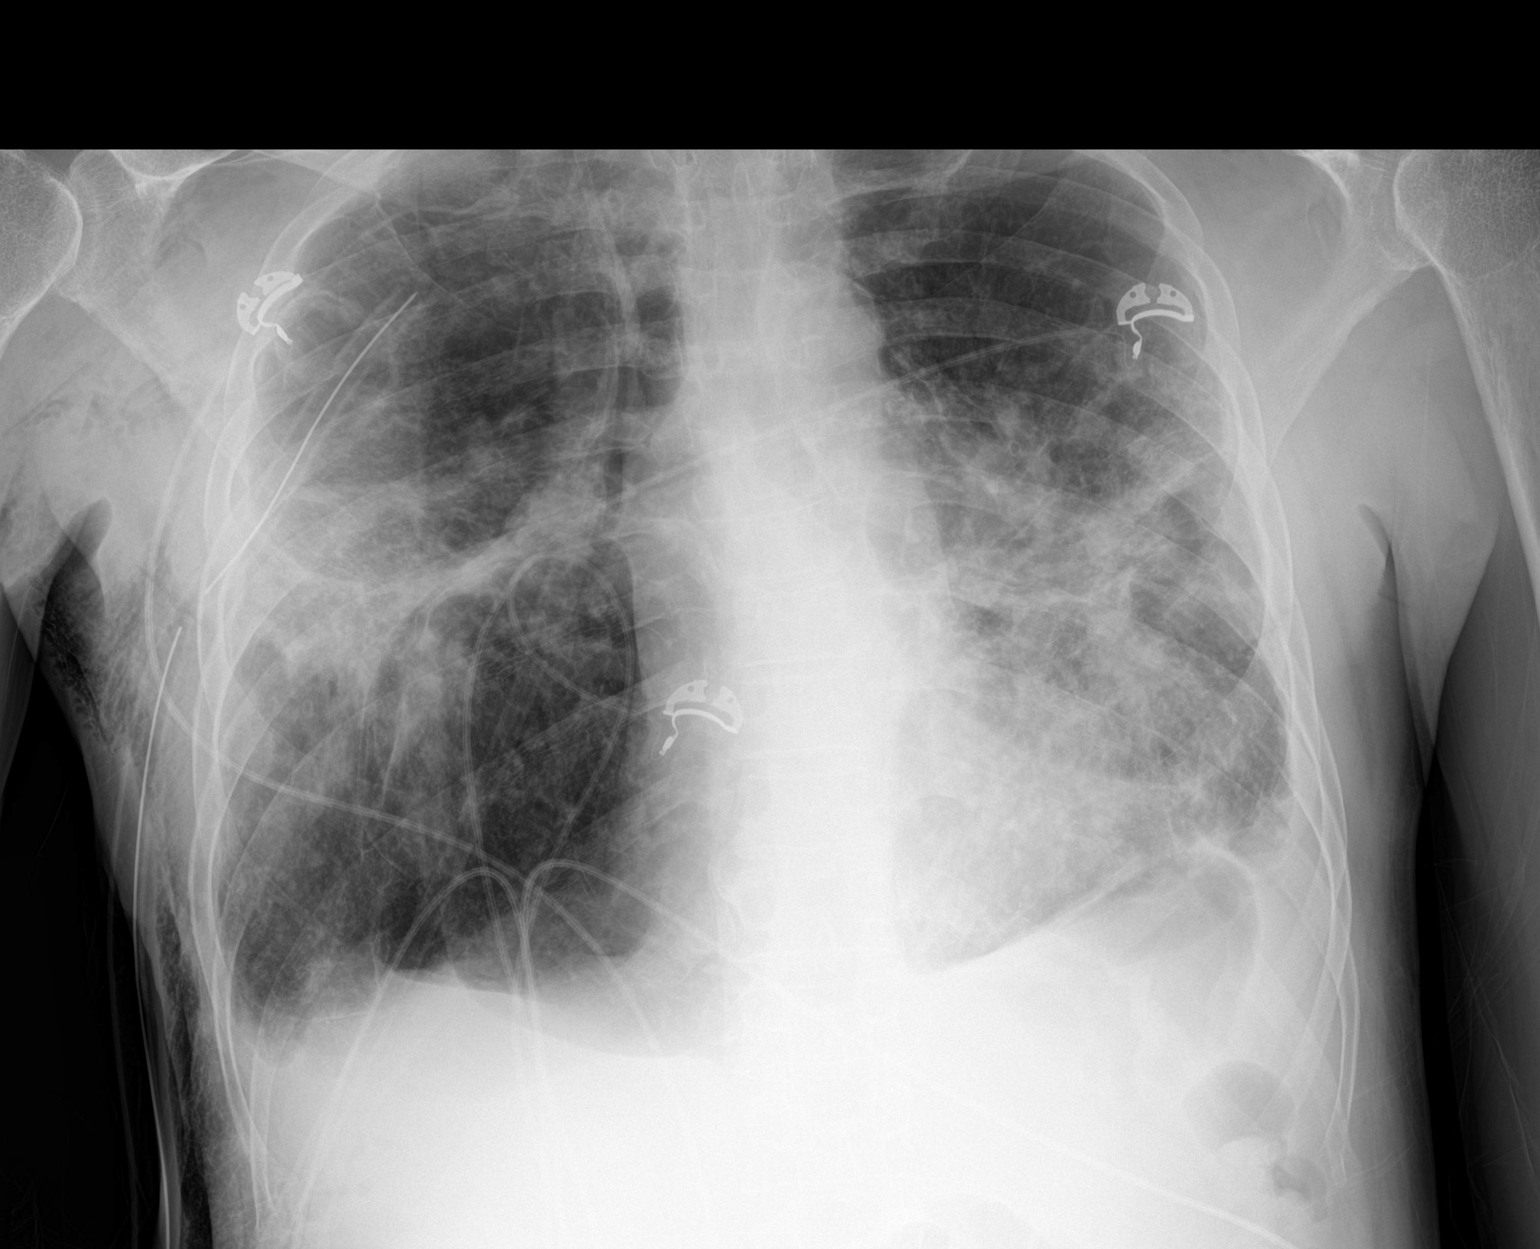

[1 of 1 positions shown; findings below may reference images not displayed]

FINDINGS: The last side hole of the chest tube remains outside the pleural
space. There is a moderate amount of RIGHT-sided subcutaneous
emphysema, probably stable. Small RIGHT pneumothorax, also stable.
No change BILATERAL pulmonary opacities or cardiomediastinal
silhouette.
IMPRESSION: RIGHT chest tube remains malpositioned. Small RIGHT apical
pneumothorax and moderate subcutaneous emphysema remain unchanged.

## 2018-07-13 IMAGING — DX DG CHEST 1V PORT
1 series · 1 of 1 positions shown · non-contrast
Comparison: Yesterday

CLINICAL DATA: Pneumothorax

EXAM:
PORTABLE CHEST 1 VIEW

[chest ap]
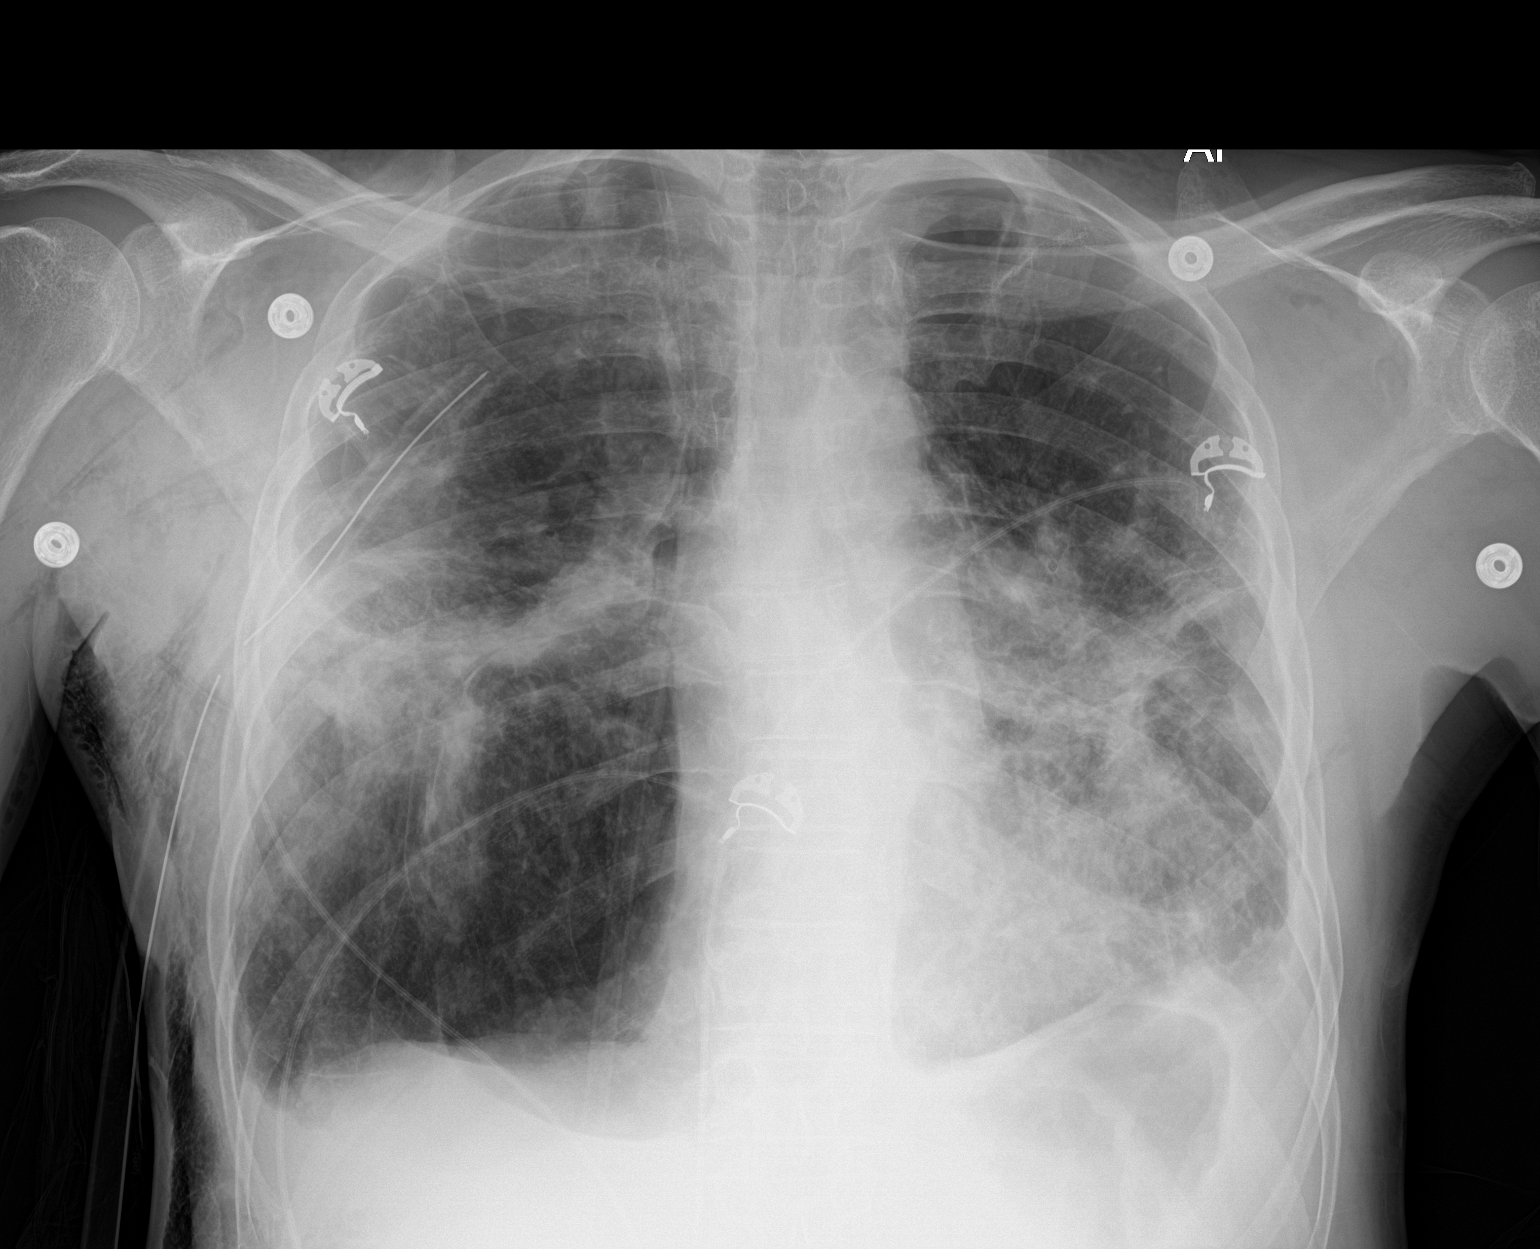

[1 of 1 positions shown; findings below may reference images not displayed]

FINDINGS: Chest tube on the right with side port at the costal margin, also
seen previously. Small right apical pneumothorax. There may be an
interlobar gas component. Chest wall emphysema likely decreased at
the supraclavicular fossae. History of sarcoidosis with advanced
fibrosis. Stable mediastinal contours.
IMPRESSION: 1. Small right-sided pneumothorax. Chest wall emphysema is mildly
decreased.
2. Stable chest tube positioning with proximal side port at the bony
chest wall.

## 2018-07-14 IMAGING — DX DG CHEST 1V PORT
1 series · 1 of 1 positions shown · non-contrast
Comparison: Radiographs of same day.

CLINICAL DATA: Respiratory failure, shortness of breath.

EXAM:
PORTABLE CHEST 1 VIEW

[chest ap]
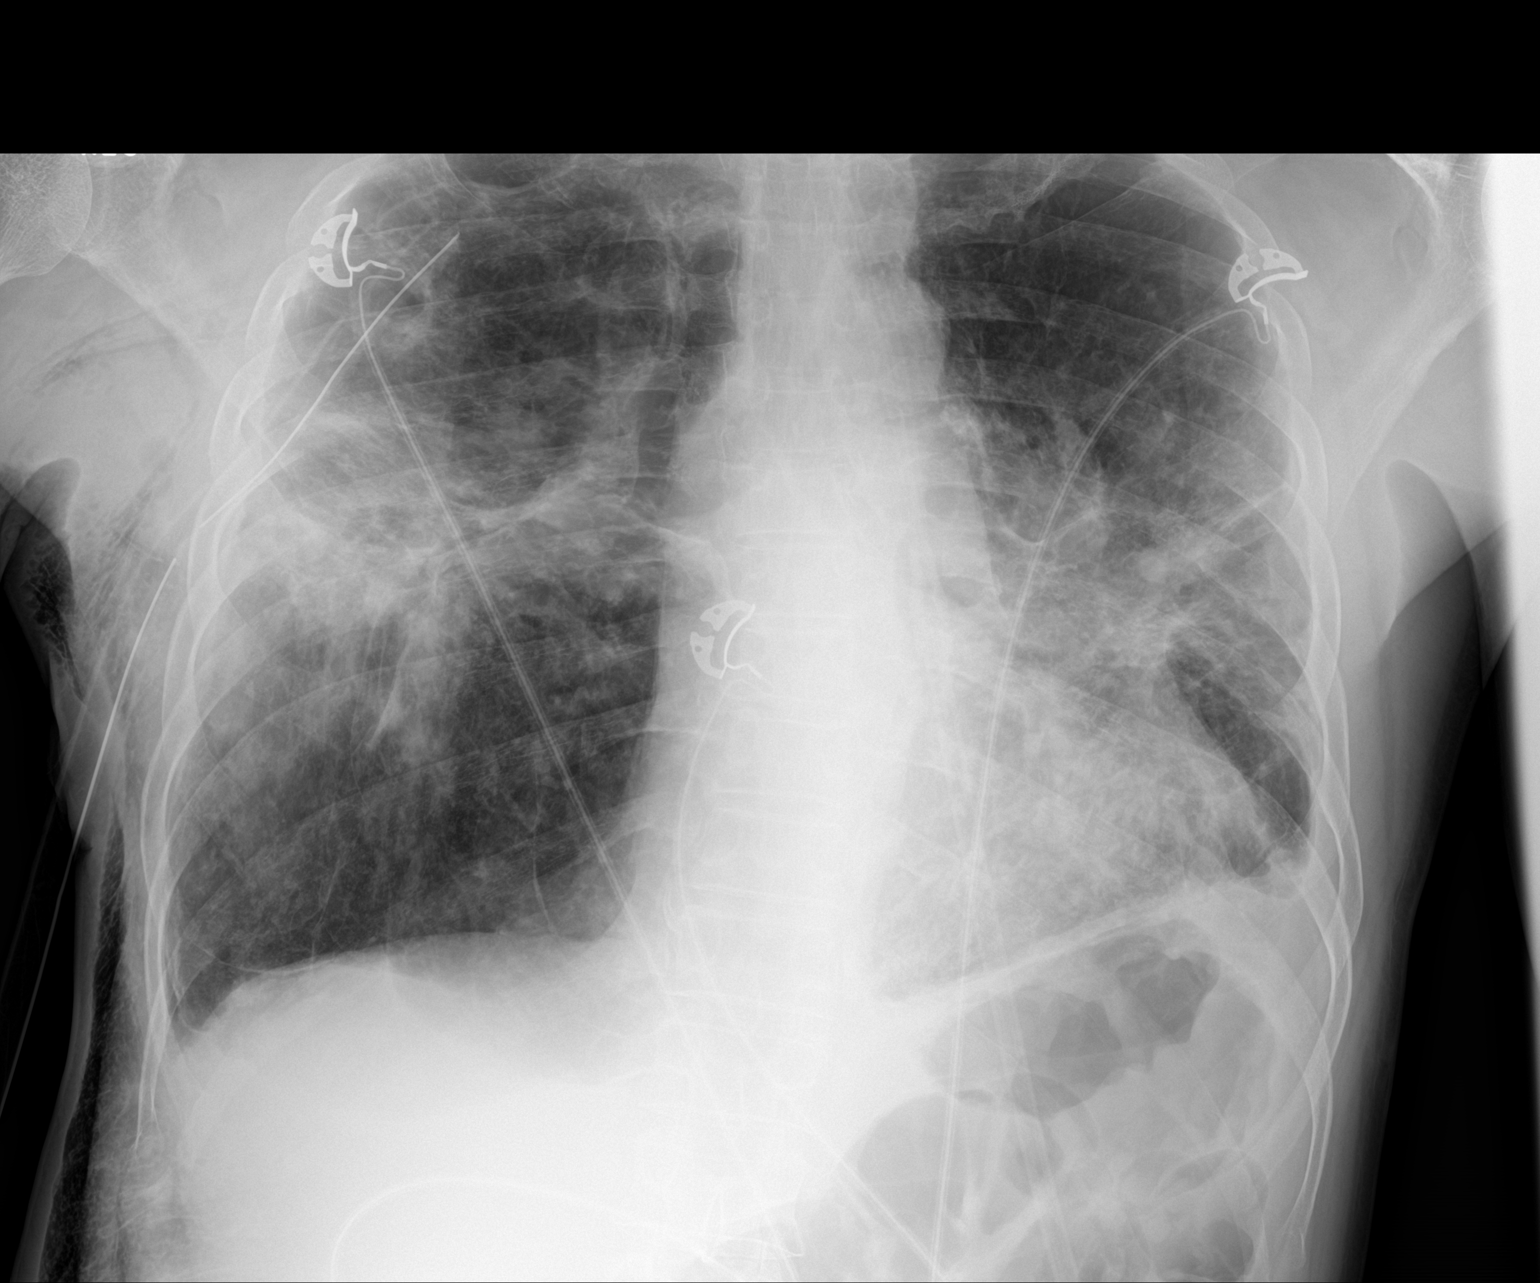

[1 of 1 positions shown; findings below may reference images not displayed]

FINDINGS: Stable cardiomediastinal silhouette. Right-sided chest tube has been
directed more apically. Mild right apical pneumothorax is noted
which is slightly enlarged compared to prior exam overlying
subcutaneous emphysema is noted as the side hole of the chest tube
is outside of thoracic space. Postsurgical changes are noted in left
lung apex. Stable bilateral midlung opacities are noted concerning
for scarring, atelectasis or possibly fibrosis. Scarring is noted in
left lung base. Bony thorax is unremarkable.
IMPRESSION: Right-sided chest tube has been repositioned with tip more apical in
position. Mild right apical pneumothorax is noted which is enlarged
compared to prior exam. Side hole of chest tube is outside of the
thoracic space. Mild subcutaneous emphysema is seen over right
lateral chest wall.

## 2018-07-14 IMAGING — DX DG CHEST 1V PORT
1 series · 1 of 1 positions shown · non-contrast
Comparison: 05/02/2017

CLINICAL DATA: Pneumothorax

EXAM:
PORTABLE CHEST 1 VIEW

[chest ap]
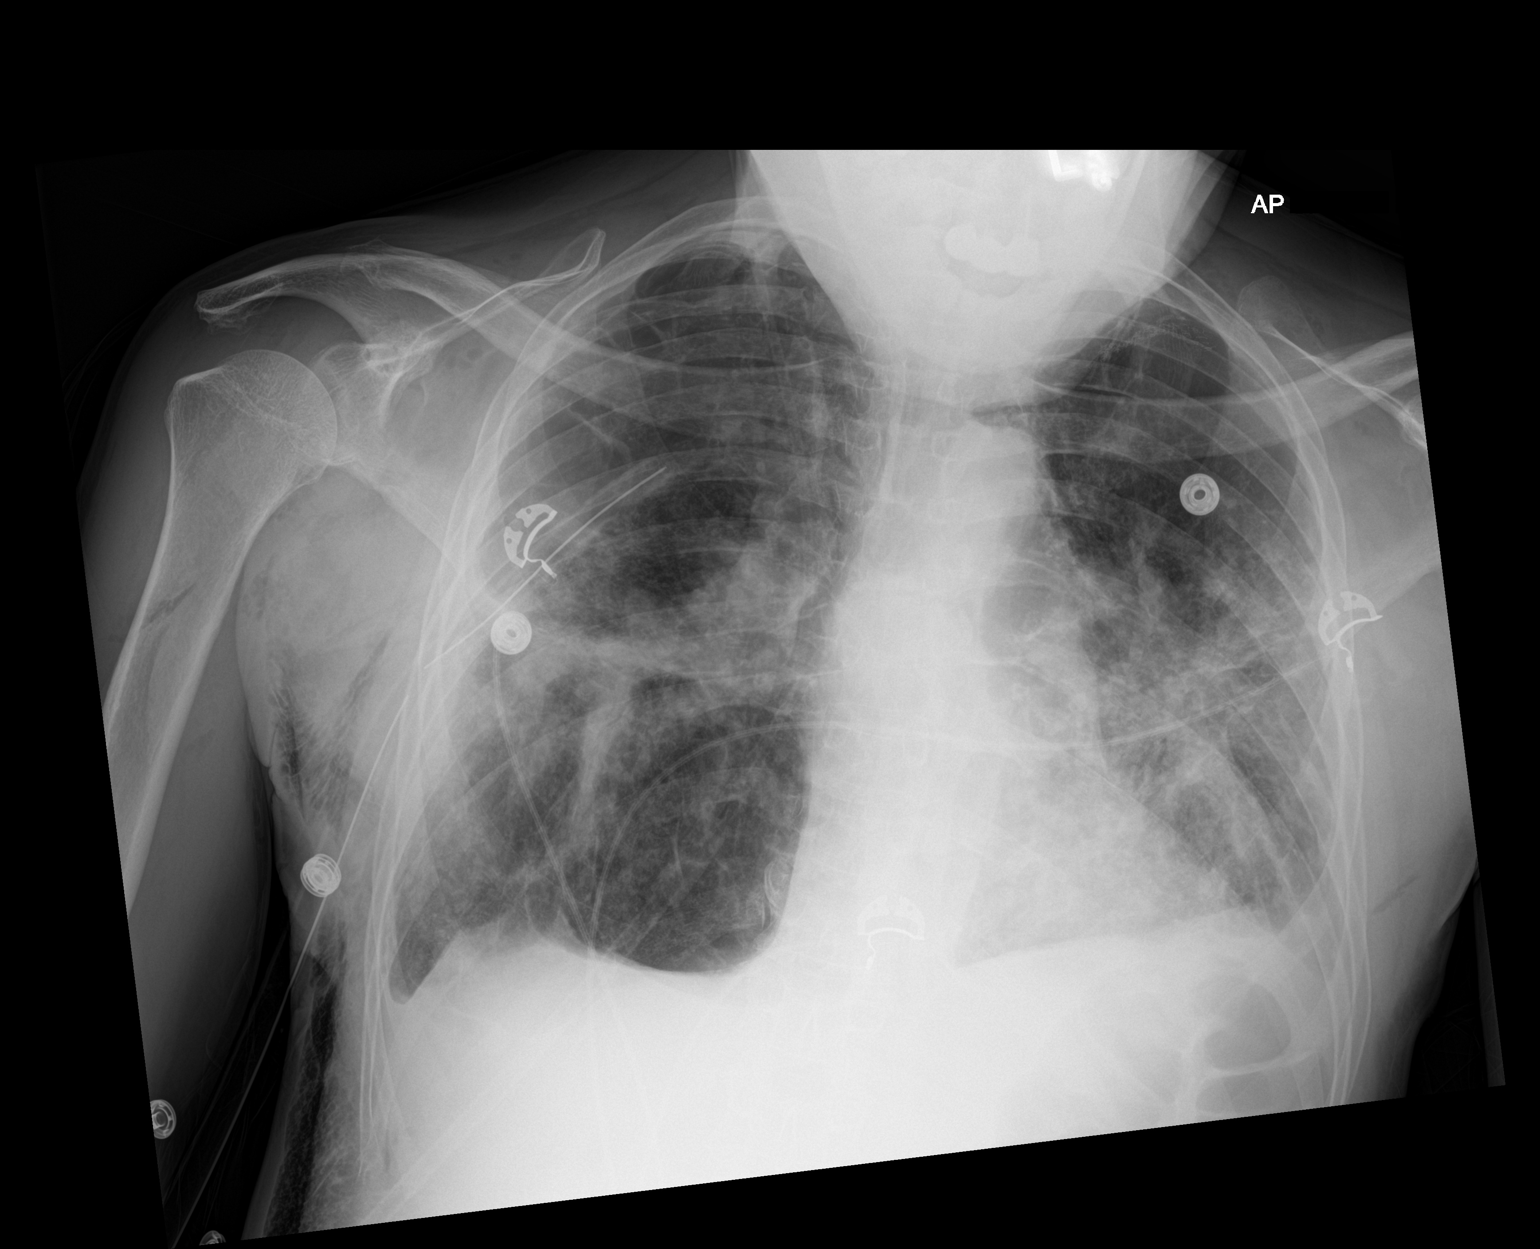

[1 of 1 positions shown; findings below may reference images not displayed]

FINDINGS: Right chest tube remains in place with the side port in the chest
wall soft tissues. Right apical pneumothorax again noted, small,
unchanged. Subcutaneous emphysema in the right chest wall. Patchy
bilateral airspace disease with small effusions, unchanged. Heart is
normal size.
IMPRESSION: Stable position of the right chest tube with the side port in the
chest wall soft tissues. Small right apical pneumothorax and
subcutaneous emphysema are unchanged.

Patchy bilateral opacities and small effusions, stable.

## 2018-07-14 IMAGING — DX DG CHEST 1V PORT
1 series · 3 of 3 positions shown · non-contrast
Comparison: Portable exam 8389 hours compared to 05/03/2017

CLINICAL DATA: Continued shortness of breath, chest tube,
sarcoidosis

EXAM:
PORTABLE CHEST 1 VIEW

[Series 1: chest ap · 0.14mm/px · 3 of 3 slices shown]
[im 1/3]
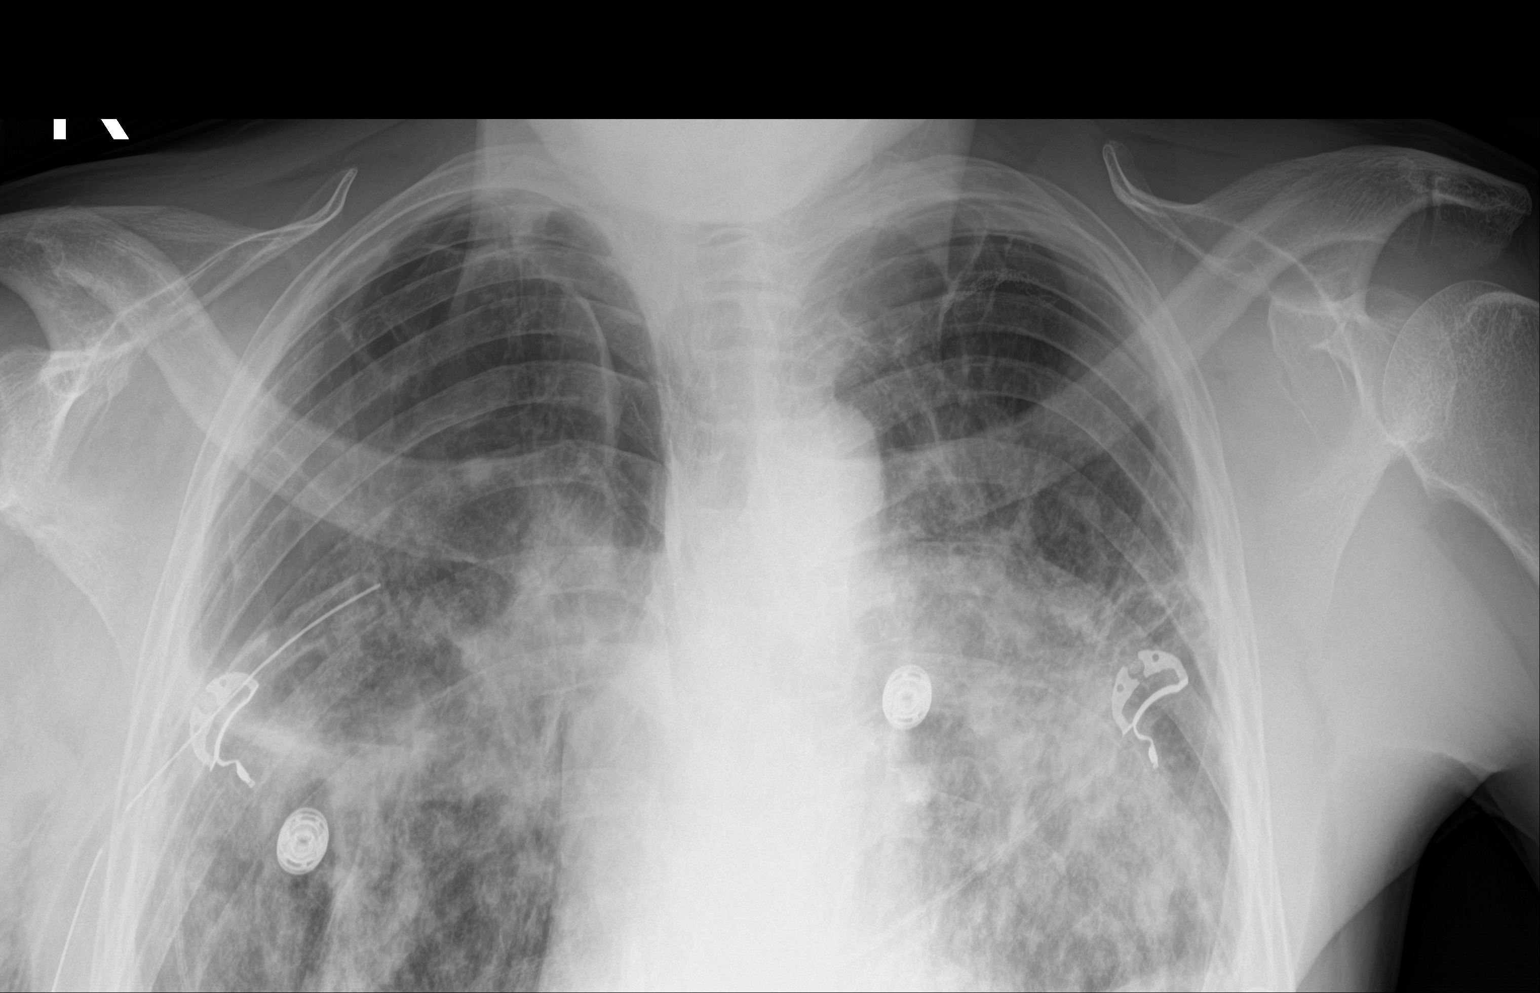
[im 2/3]
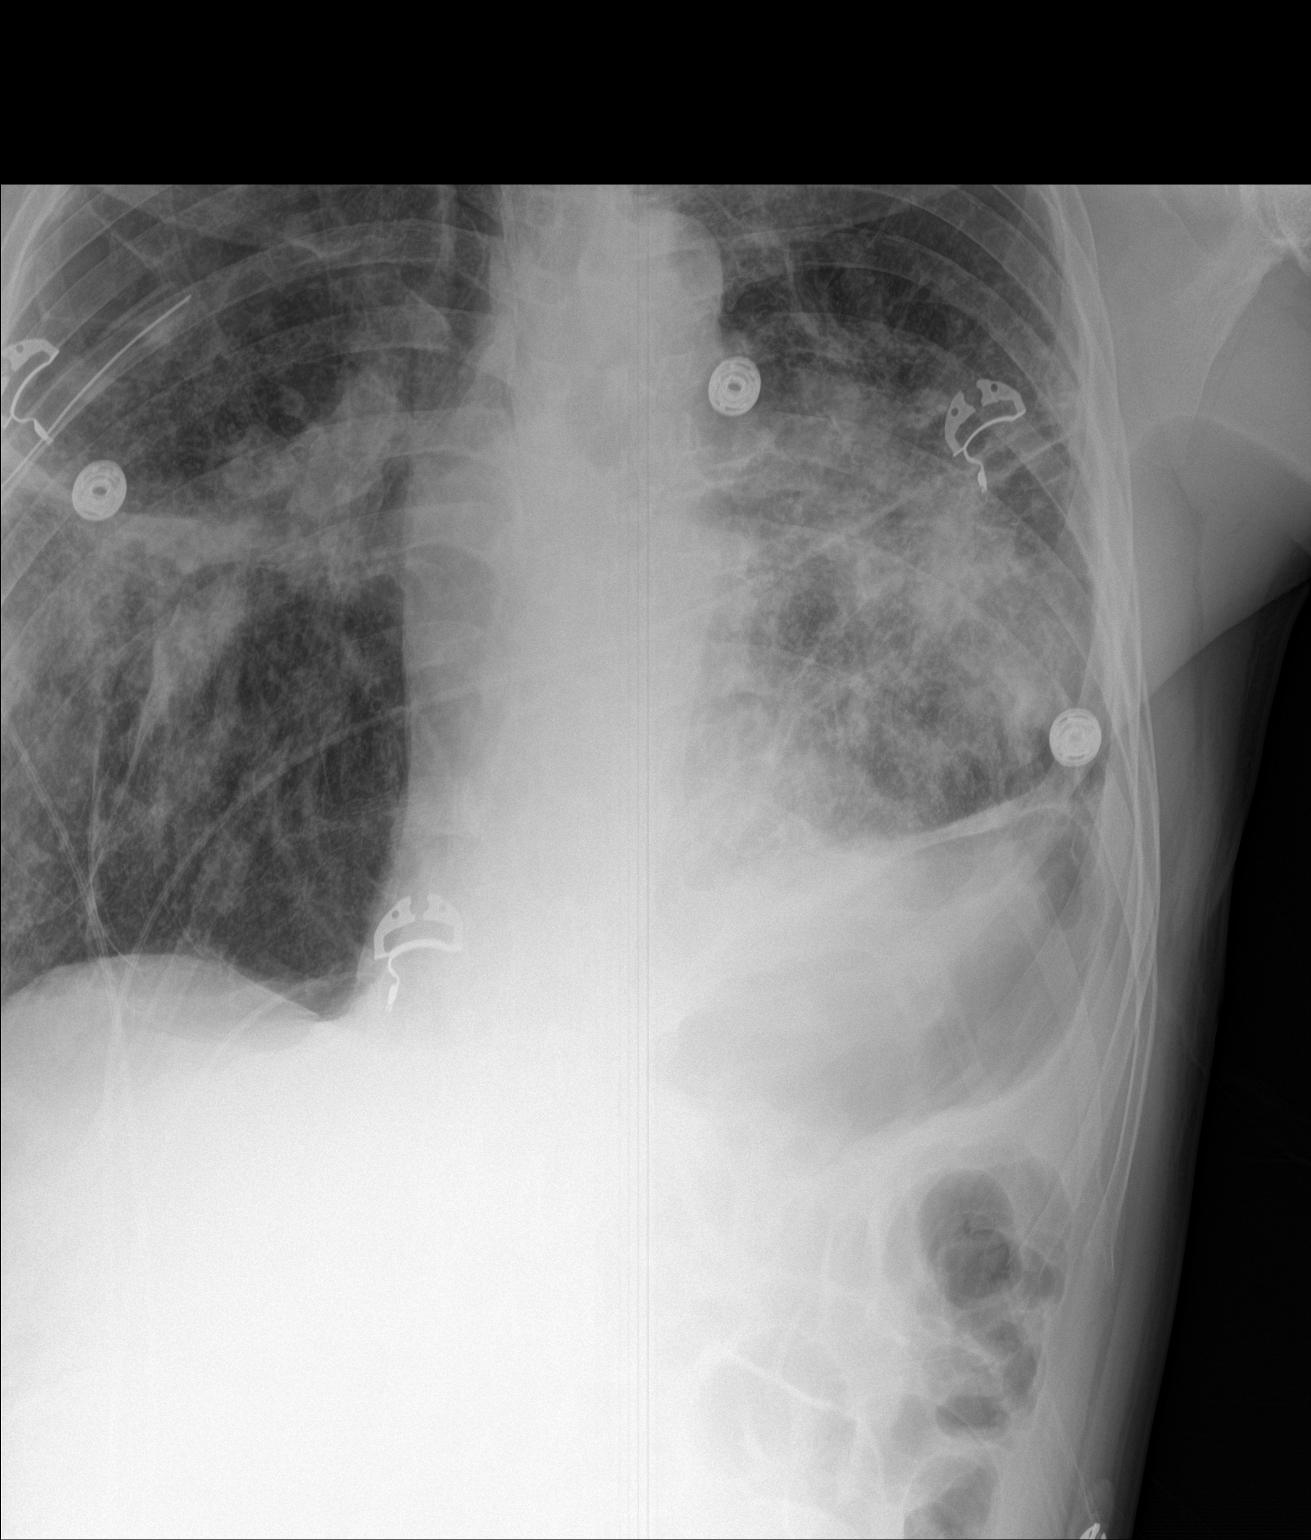
[im 3/3]
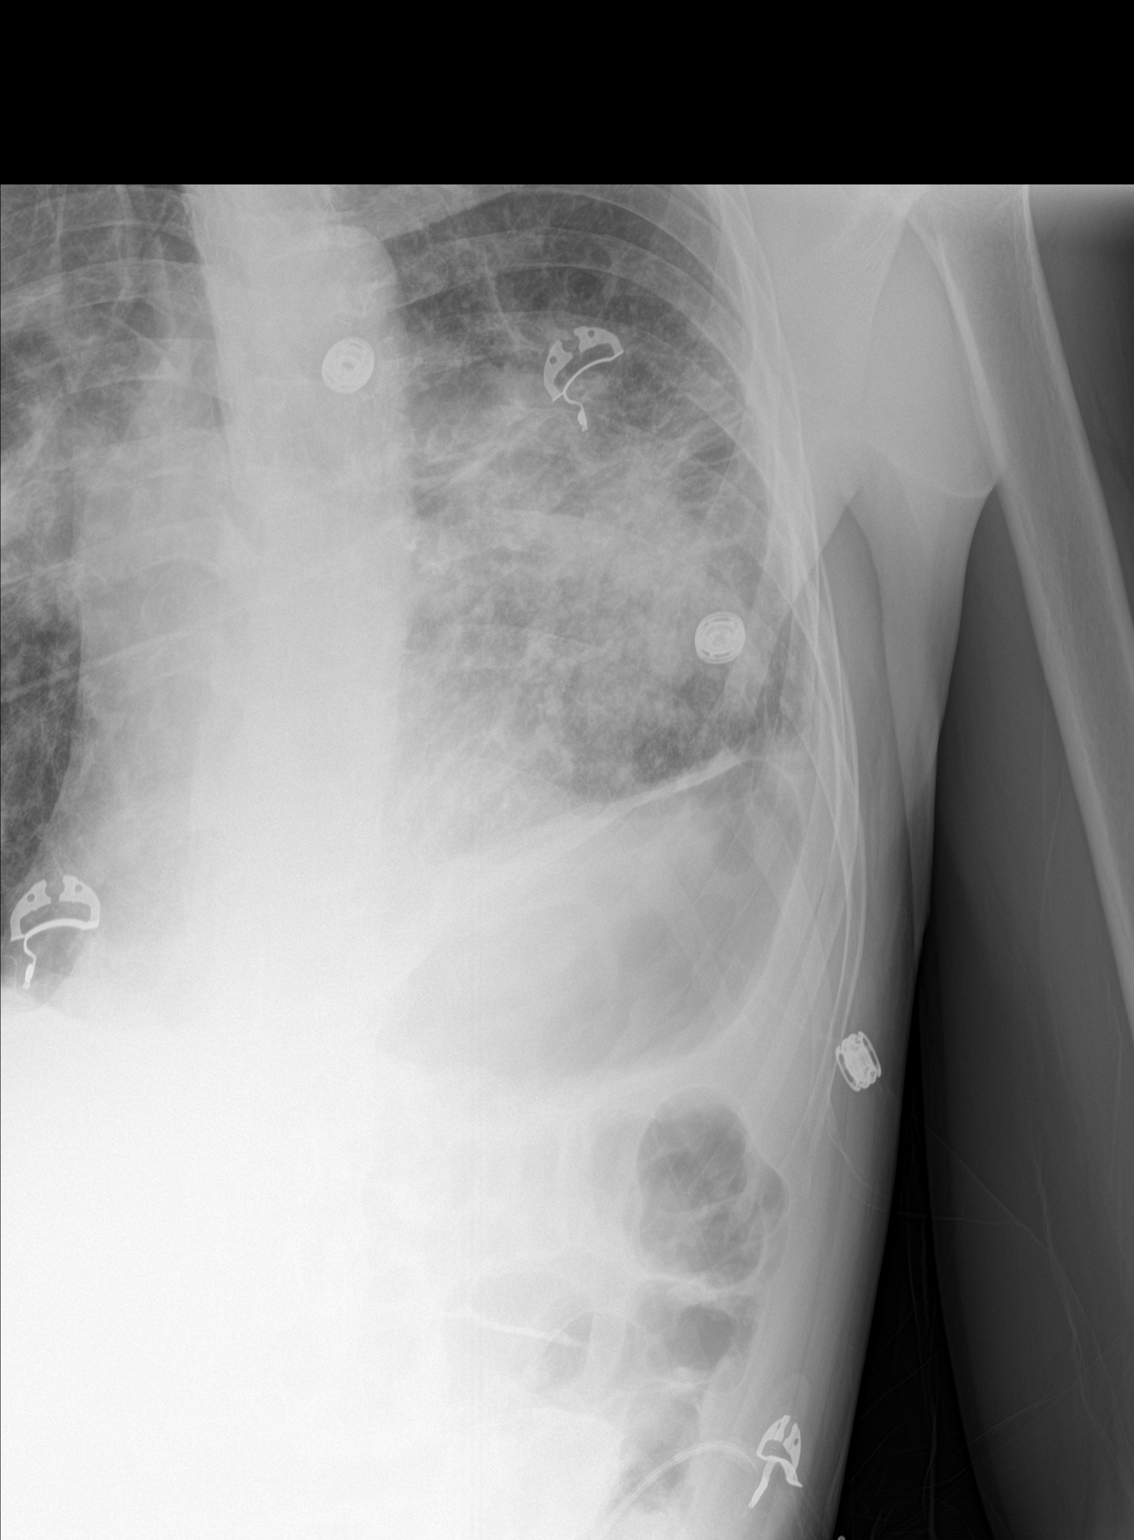

[3 of 3 positions shown; findings below may reference images not displayed]

FINDINGS: RIGHT thoracostomy tube unchanged.

Persistent RIGHT apex pneumothorax, medial extent better defined
than on previous exam study.

Stable heart size and mediastinal contours.

Extensive LEFT basilar infiltrate.

Atelectasis in RIGHT mid lung with additional infiltrate at RIGHT
base.

Underlying emphysematous changes with scarring and postsurgical
changes at LEFT apex.

No gross pleural effusion or pneumothorax.

Chronic elevation of LEFT diaphragm.

Bones demineralized.
IMPRESSION: Persistent RIGHT pneumothorax despite thoracostomy tube.

COPD changes with bibasilar pulmonary infiltrates LEFT greater than
RIGHT and RIGHT mid lung atelectasis.

## 2018-07-15 IMAGING — DX DG CHEST 1V PORT
1 series · 1 of 1 positions shown · non-contrast
Comparison: Yesterday

CLINICAL DATA: Pneumothorax follow-up

EXAM:
PORTABLE CHEST 1 VIEW

[chest ap]
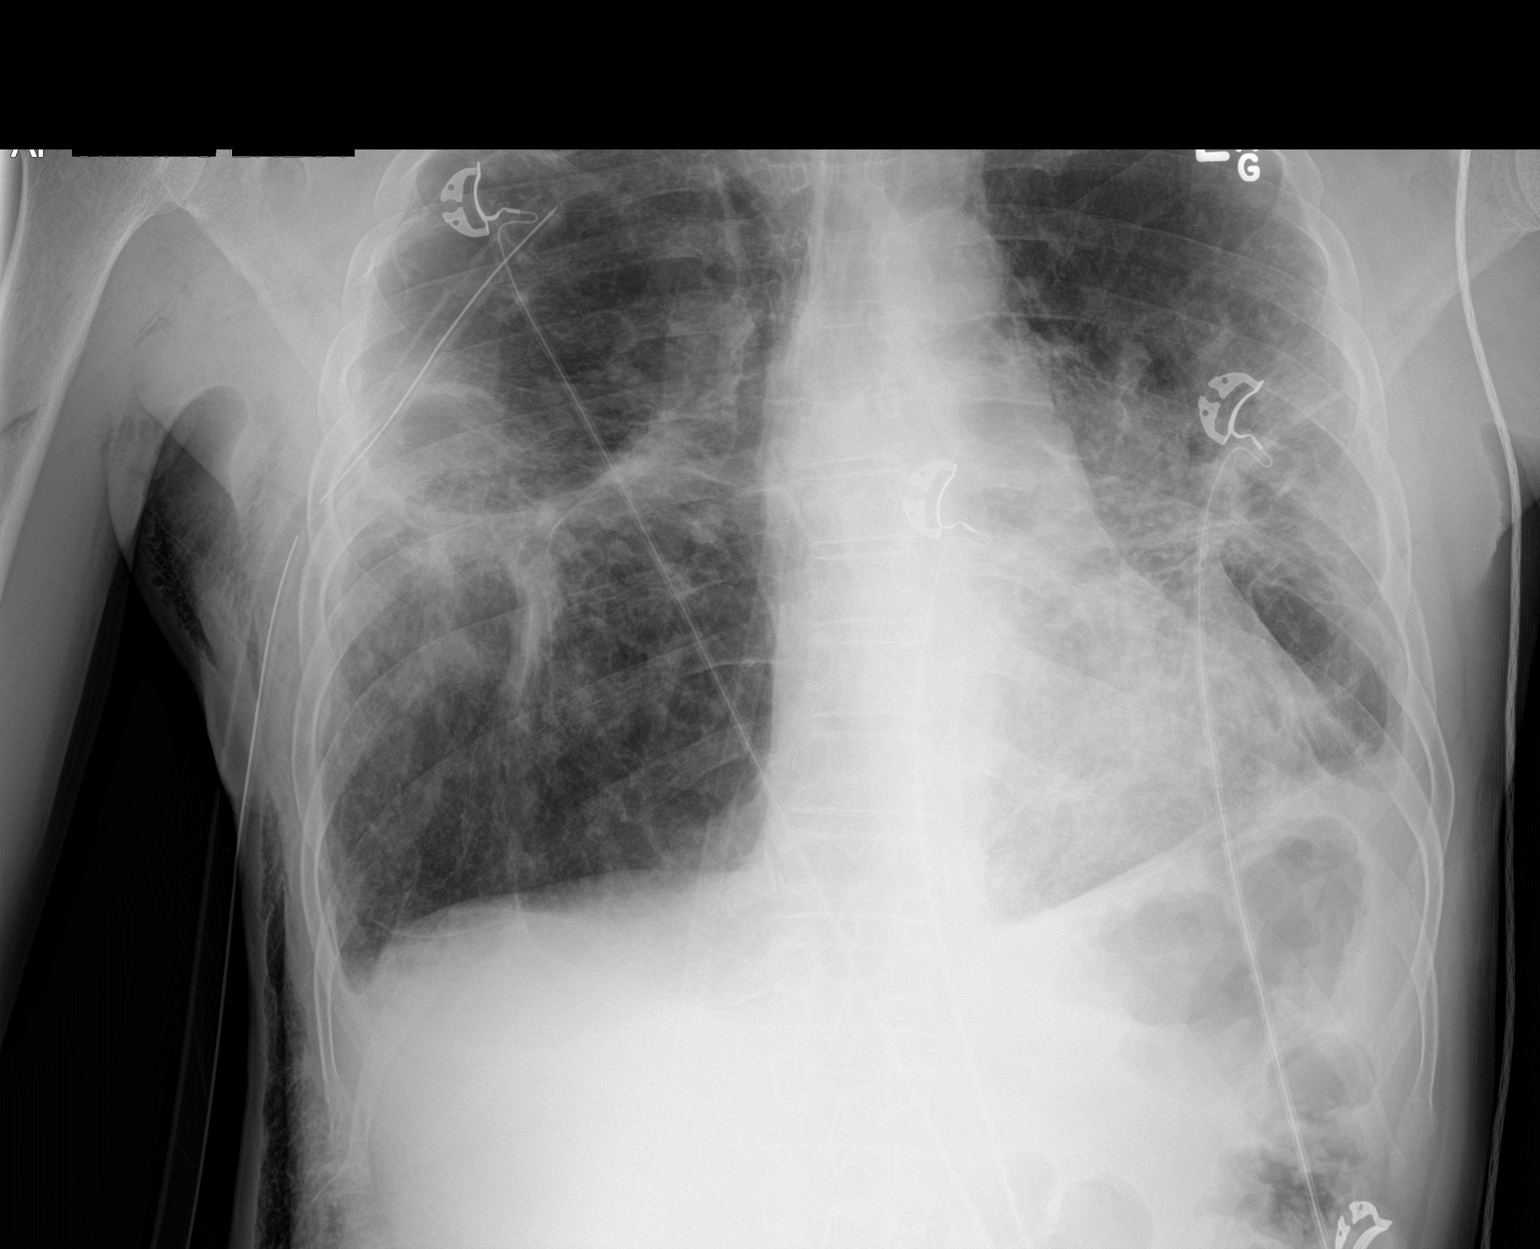

[1 of 1 positions shown; findings below may reference images not displayed]

FINDINGS: Advanced fibrosis with bullae in this patient with sarcoidosis. Some
subpleural lines are chronic and fibrotic, but there is still likely
residual small right pneumothorax when correlated with chest x-ray
from January 2017. Unchanged right-sided chest tube with side port
at the bony thoracic margin. Stable heart size.
IMPRESSION: 1. Stable chest. Detection of pneumothorax is challenging due to
extent of chronic lung disease and subpleural lines but there is
still convincing small right pneumothorax.
2. Stable chest tube positioning with side port at the bony margin.

## 2018-07-15 IMAGING — DX DG CHEST 1V PORT
1 series · 1 of 1 positions shown · non-contrast
Comparison: Radiograph of same day.

CLINICAL DATA: Pneumothorax.

EXAM:
PORTABLE CHEST 1 VIEW

[chest ap]
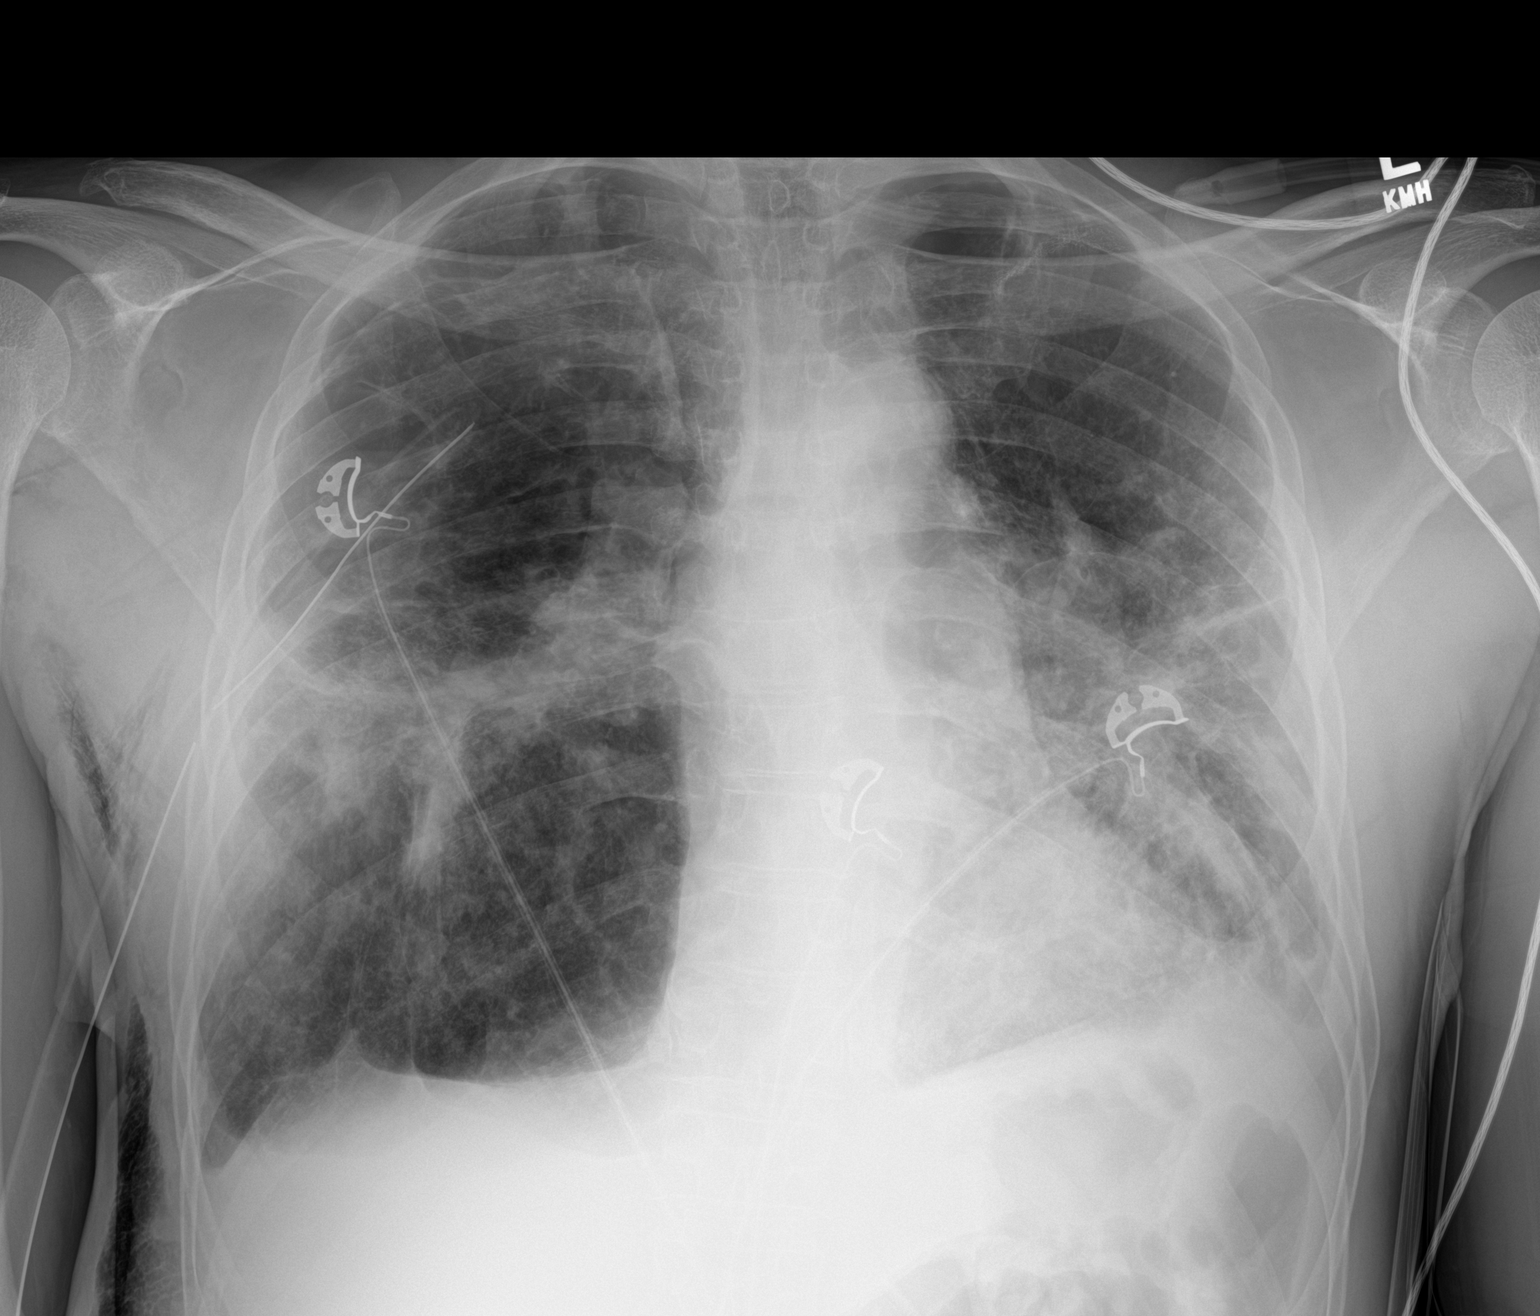

[1 of 1 positions shown; findings below may reference images not displayed]

FINDINGS: Stable cardiomediastinal silhouette. Right-sided chest tube is
unchanged in position. Stable mild right apical pneumothorax is
noted. Stable bilateral lung opacities are noted. Bibasilar scarring
or minimal pleural effusions is noted.
IMPRESSION: Stable position of right-sided chest tube. Stable mild right apical
pneumothorax is noted. Stable bilateral lung opacities.

## 2018-07-16 IMAGING — DX DG CHEST 1V PORT
1 series · 1 of 1 positions shown · non-contrast
Comparison: 05/04/2017

CLINICAL DATA: Pneumothorax

EXAM:
PORTABLE CHEST 1 VIEW

[chest ap]
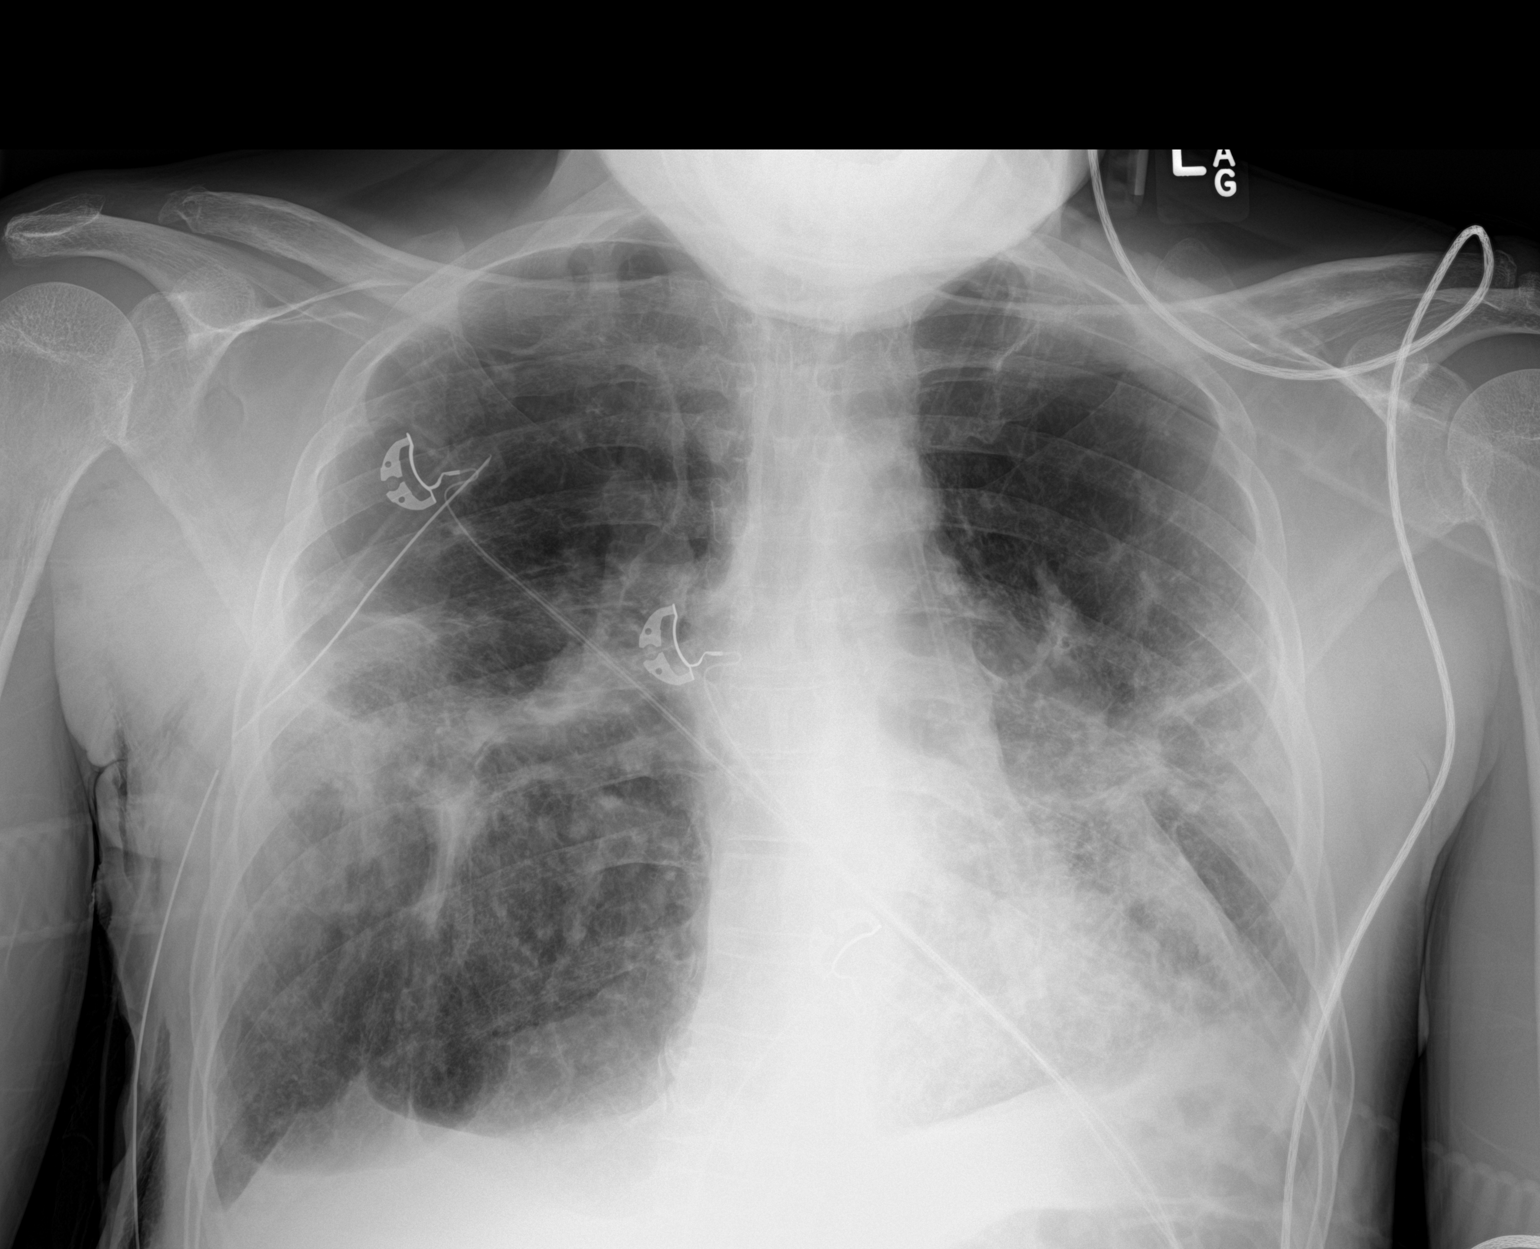

[1 of 1 positions shown; findings below may reference images not displayed]

FINDINGS: Right chest tube remains in place. Right apical pneumothorax again
noted, unchanged. The side port of the right chest tube remains in
the chest wall soft tissues. Mild subcutaneous emphysema. Patchy
bilateral airspace opacities again noted, stable. Underlying COPD.
Heart is normal size.
IMPRESSION: Stable right apical pneumothorax with right chest tube in stable
position.

## 2018-07-17 IMAGING — DX DG ABDOMEN 1V
1 series · 1 of 1 positions shown · non-contrast
Comparison: Radiograph April 22, 2017.

CLINICAL DATA: Orogastric tube placement.

EXAM:
ABDOMEN - 1 VIEW

[abdomen kub]
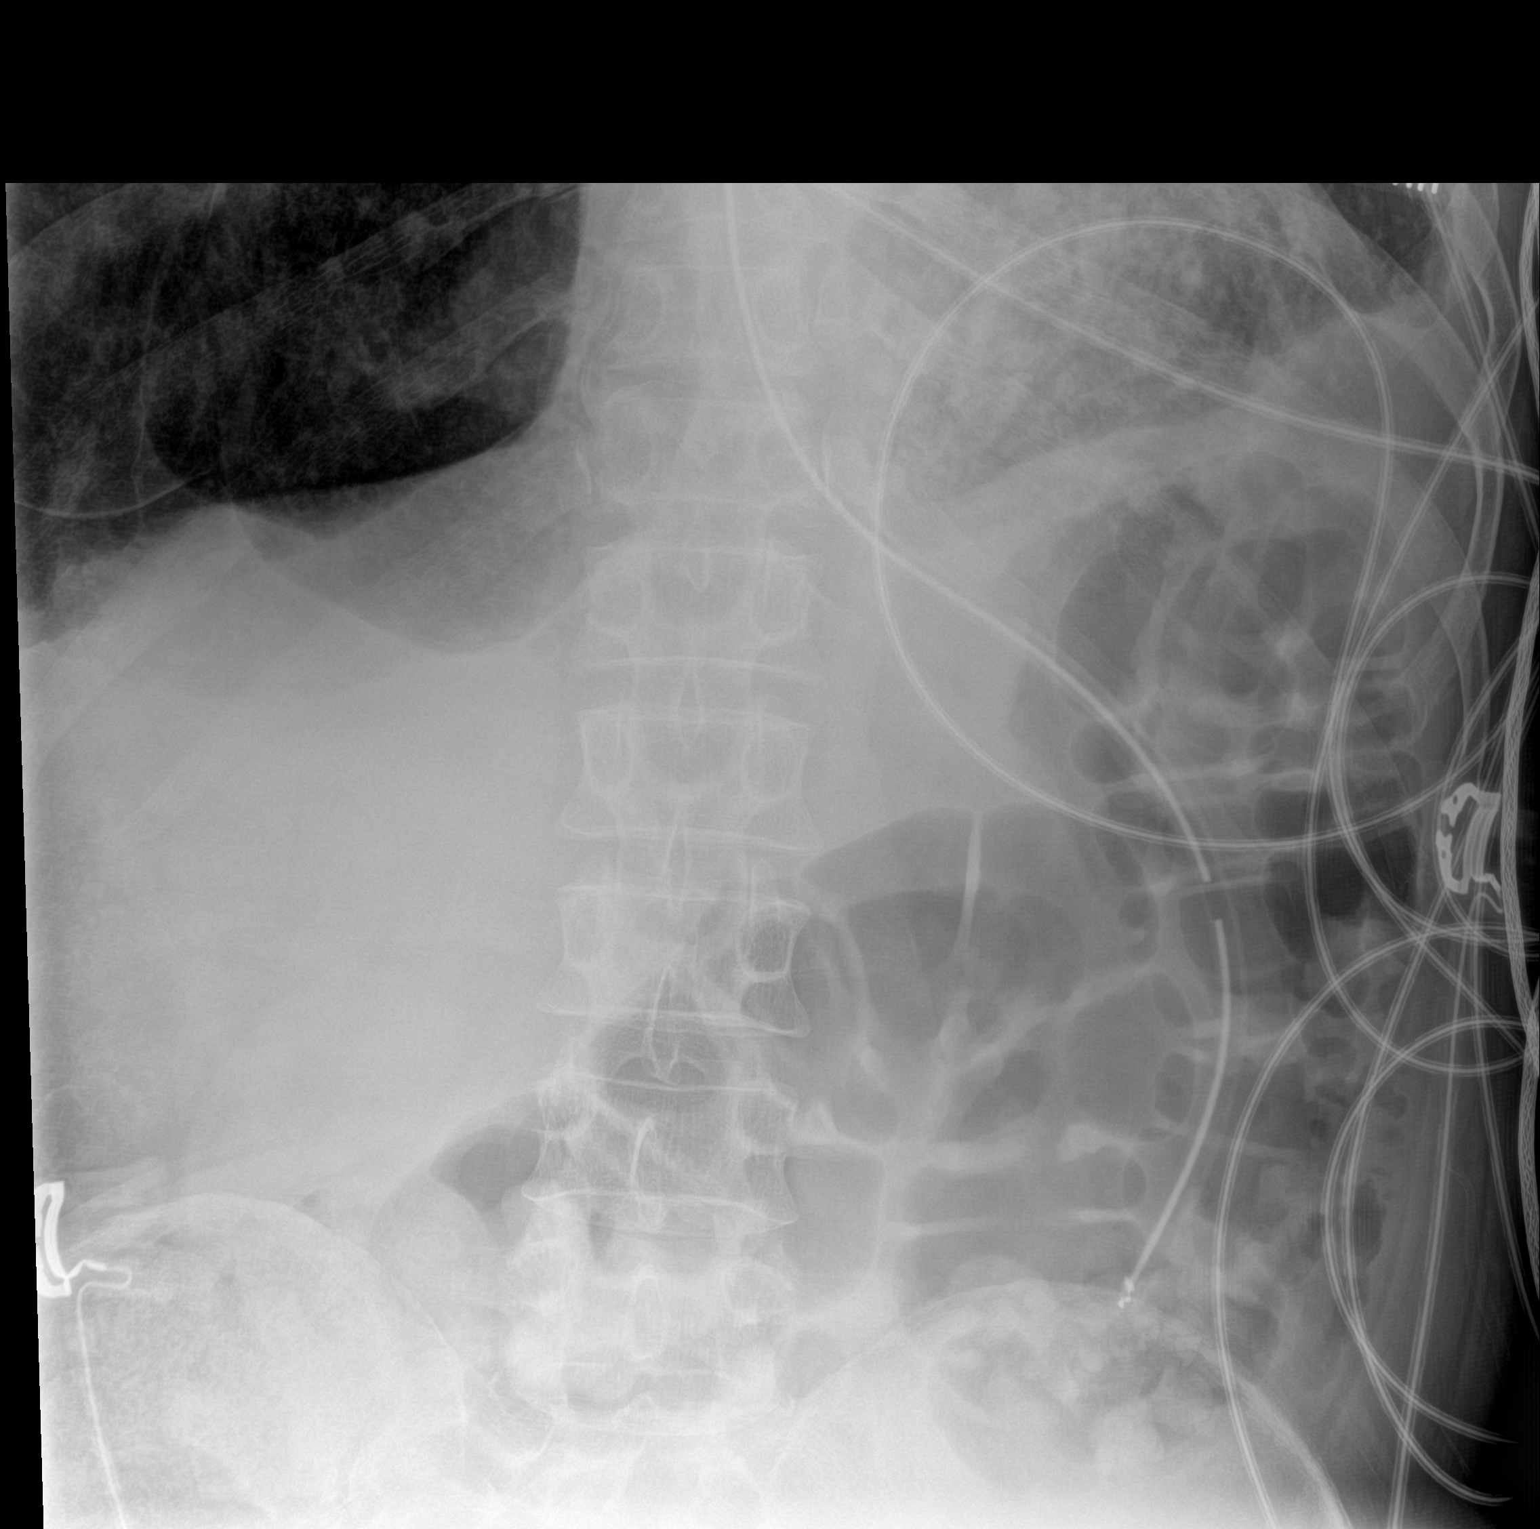

[1 of 1 positions shown; findings below may reference images not displayed]

FINDINGS: Distal tip of enteric tube is seen in the expected position of the
stomach. No abnormal bowel dilatation is noted.
IMPRESSION: Distal tip of enteric tube seen in the stomach.

## 2018-07-17 IMAGING — DX DG CHEST 1V PORT
1 series · 1 of 1 positions shown · non-contrast
Comparison: 05/06/2017

CLINICAL DATA: Endotracheal tube placement

EXAM:
PORTABLE CHEST 1 VIEW

[chest ap]
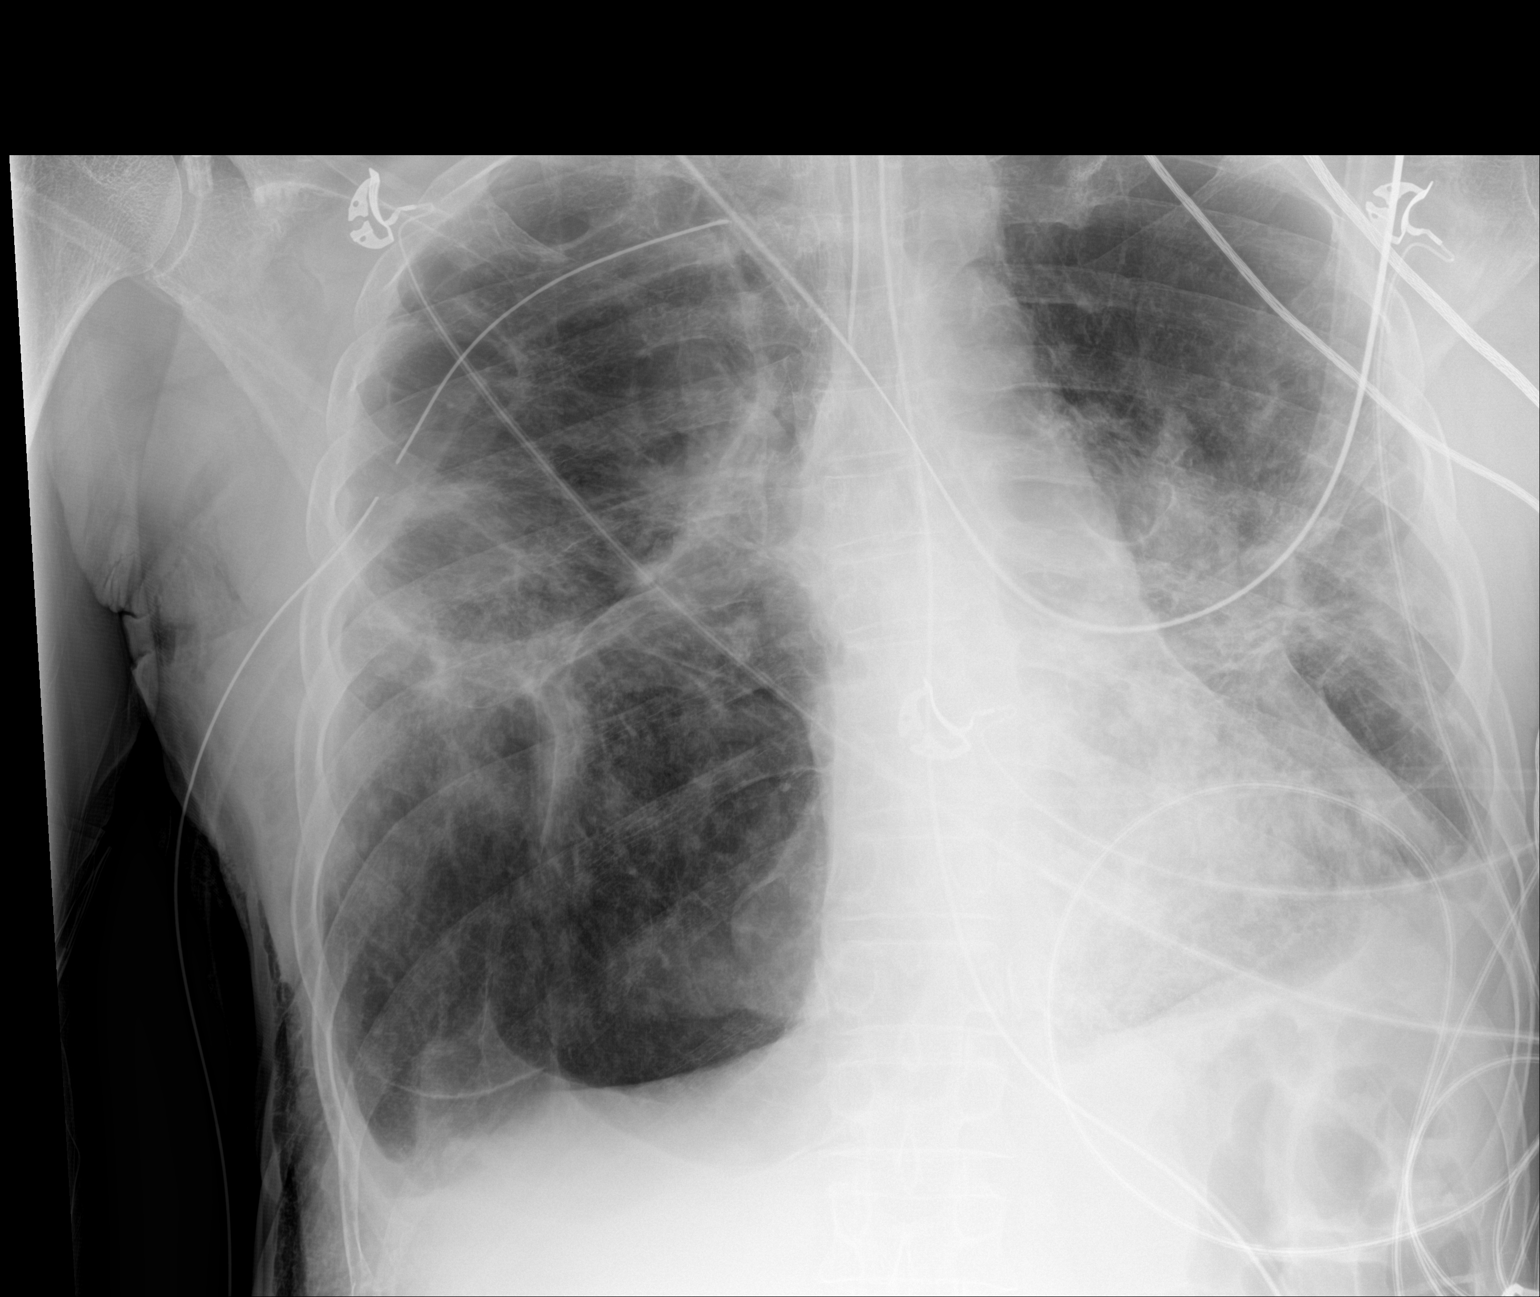

[1 of 1 positions shown; findings below may reference images not displayed]

FINDINGS: Endotracheal tube in good position. NG tube in the stomach. Right
chest tube in place. Chest tube is been advanced with the side hole
overlying the thoracic cavity. No pneumothorax

Bilateral airspace disease unchanged. Small right effusion
unchanged.
IMPRESSION: Endotracheal tube in good position.

Bilateral airspace disease unchanged.

No pneumothorax.

## 2018-07-17 IMAGING — DX DG CHEST 1V PORT
1 series · 1 of 1 positions shown · non-contrast
Comparison: 05/05/2017

CLINICAL DATA: Increasing shortness of breath.  Pneumothorax.

EXAM:
PORTABLE CHEST 1 VIEW

[chest ap]
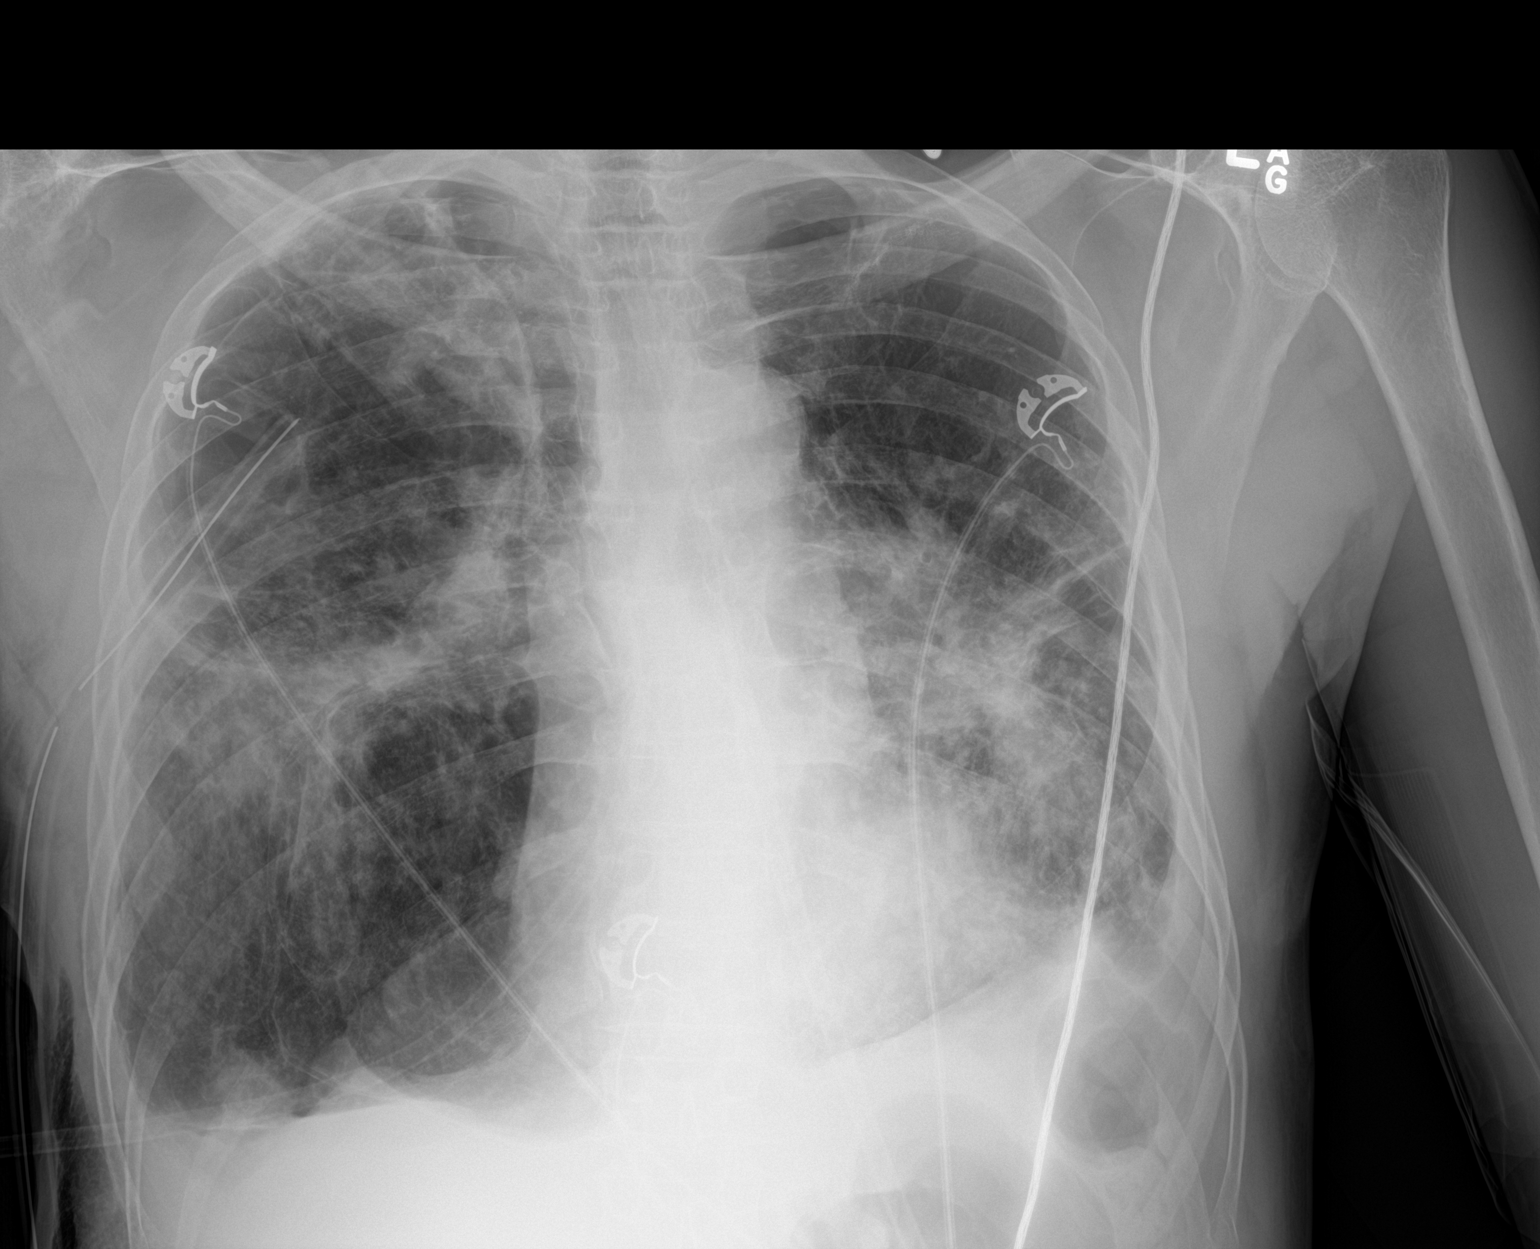

[1 of 1 positions shown; findings below may reference images not displayed]

FINDINGS: Small residual right apical pneumothorax. Right chest tube is
present. The proximal side hole of the chest tube appears to be
outside of the thoracic cage. Subcutaneous emphysema in the right
chest wall. Shallow inspiration with elevation of the left
hemidiaphragm. Perihilar infiltration or scarring bilaterally. Small
pleural effusions. Heart size and pulmonary vascularity are normal.
Previous resection or resorption of the distal right clavicle.
IMPRESSION: Small residual right pneumothorax without change since prior study.
The right chest tube is present but the proximal side hole appears
to be outside of the thoracic cage. Subcutaneous emphysema.
Bilateral perihilar infiltration or scarring. Small bilateral
pleural effusions.

These results were called by telephone at the time of interpretation
on 05/06/2017 at [DATE] to RN Colin, who verbally acknowledged these
results.

## 2018-07-18 IMAGING — DX DG CHEST 1V PORT
1 series · 1 of 1 positions shown · non-contrast
Comparison: May 06, 2017

CLINICAL DATA: Evaluate ETT.

EXAM:
PORTABLE CHEST 1 VIEW

[chest ap]
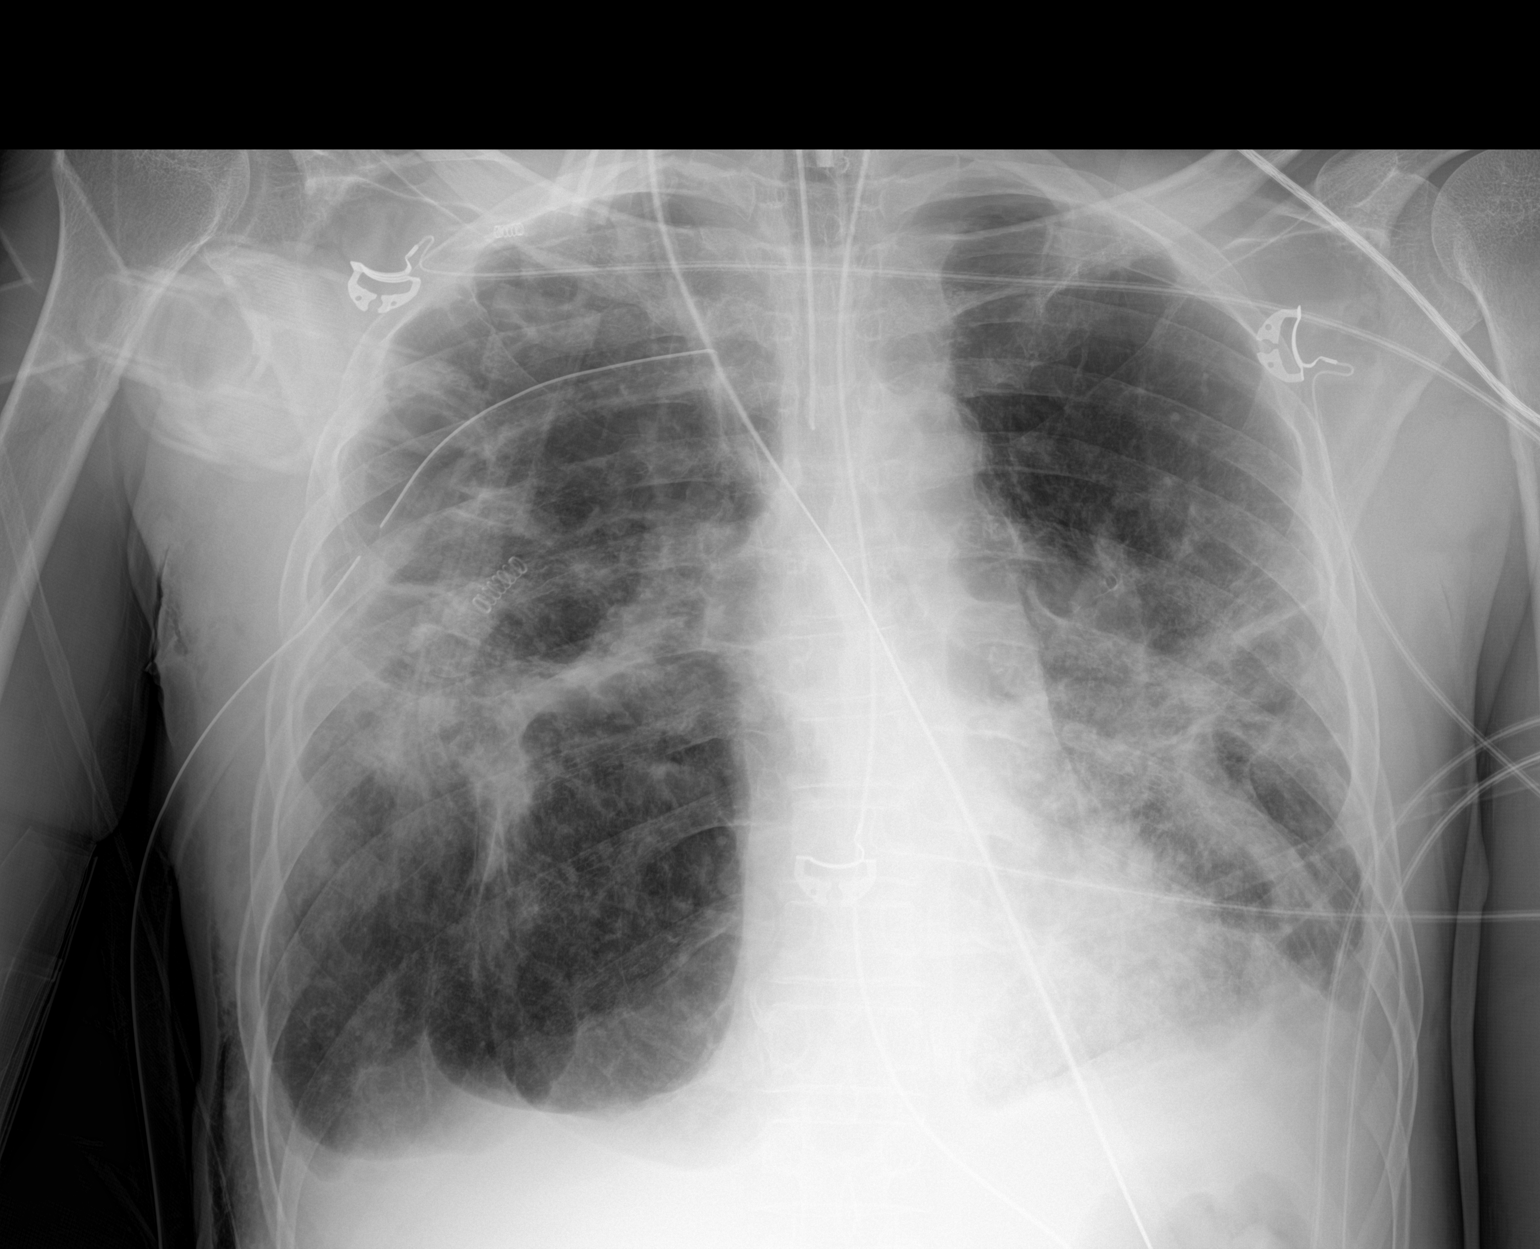

[1 of 1 positions shown; findings below may reference images not displayed]

FINDINGS: The ETT and right chest tube are stable. An NG tube terminates below
today's film. No pneumothorax. Stable bilateral pulmonary
infiltrates. No other interval changes.
IMPRESSION: 1. Stable support apparatus as above.
2. Stable bilateral pulmonary infiltrates and small effusions.

## 2018-07-20 IMAGING — DX DG CHEST 1V PORT
1 series · 1 of 1 positions shown · non-contrast
Comparison: Portable chest x-ray of today's date

CLINICAL DATA: Status post removal of the right-sided chest tube.

EXAM:
PORTABLE CHEST 1 VIEW

[chest ap]
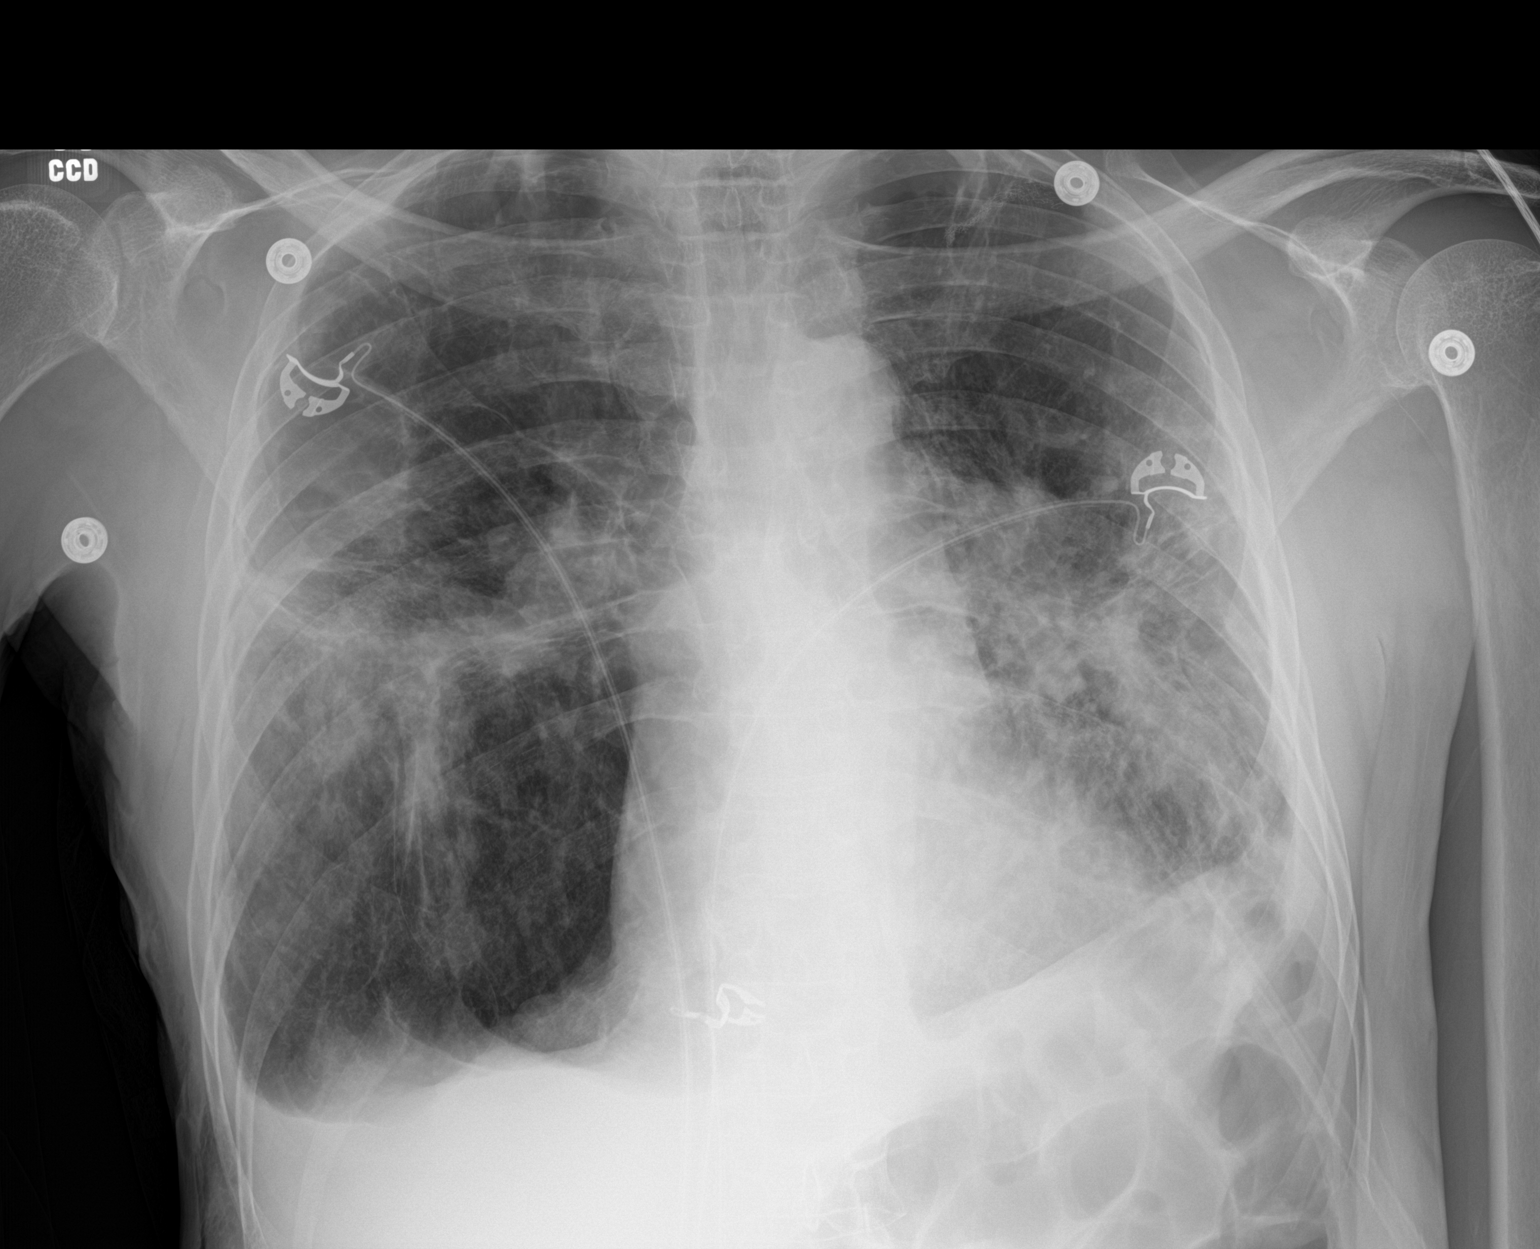

[1 of 1 positions shown; findings below may reference images not displayed]

FINDINGS: The right chest tube is been removed. There is no post removal
pneumothorax. A small right pleural effusion persists. There is
stable patchy interstitial density in the right mid lung. Hazy
increased density in the left mid and lower lung persists and the
left hemidiaphragm remains tended upper and laterally. There is no
mediastinal shift. The heart and pulmonary vascularity are normal.
IMPRESSION: No reaccumulation of a pneumothorax or nor large pleural effusion
following chest tube removal. Stable small right pleural effusion.
Stable appearance of the pulmonary parenchyma bilaterally.

## 2018-07-20 IMAGING — DX DG CHEST 1V PORT
2 series · 2 of 2 positions shown · non-contrast
Comparison: Radiograph May 07, 2017.

CLINICAL DATA: Pneumothorax on right.

EXAM:
PORTABLE CHEST 1 VIEW

[chest ap (1 of 2)]
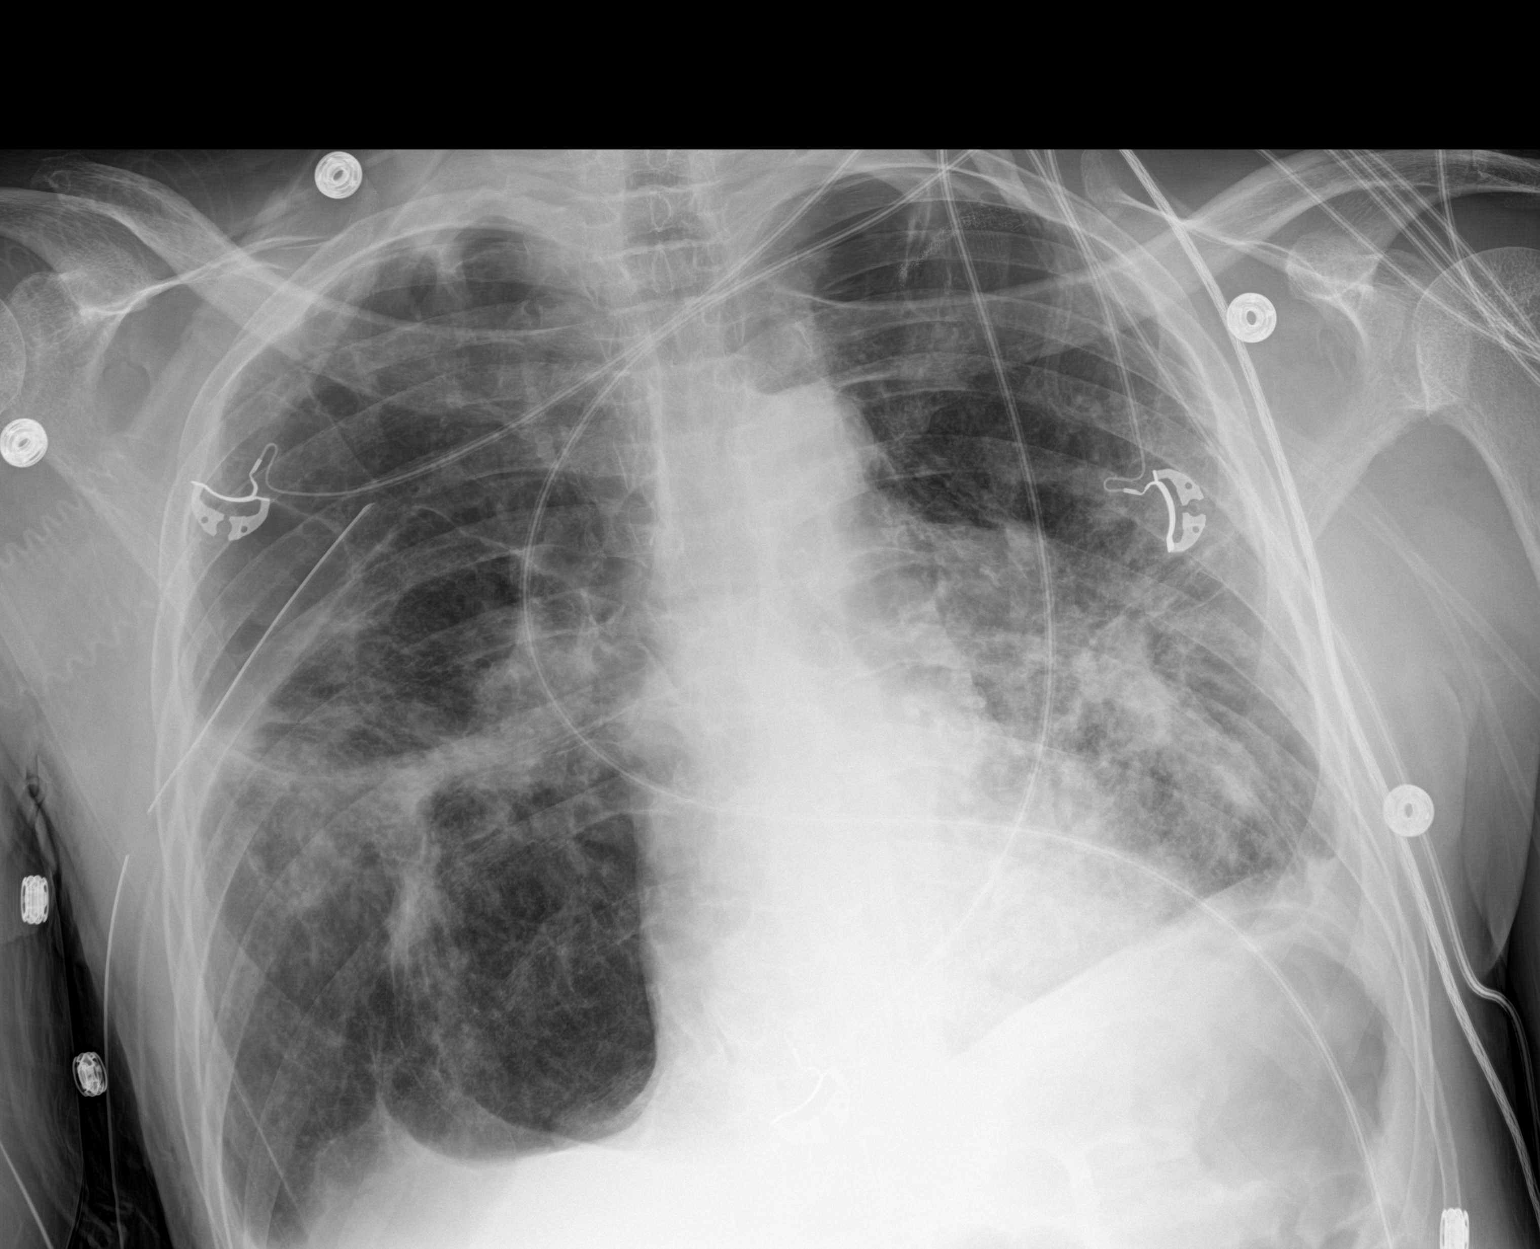

[chest ap (2 of 2)]
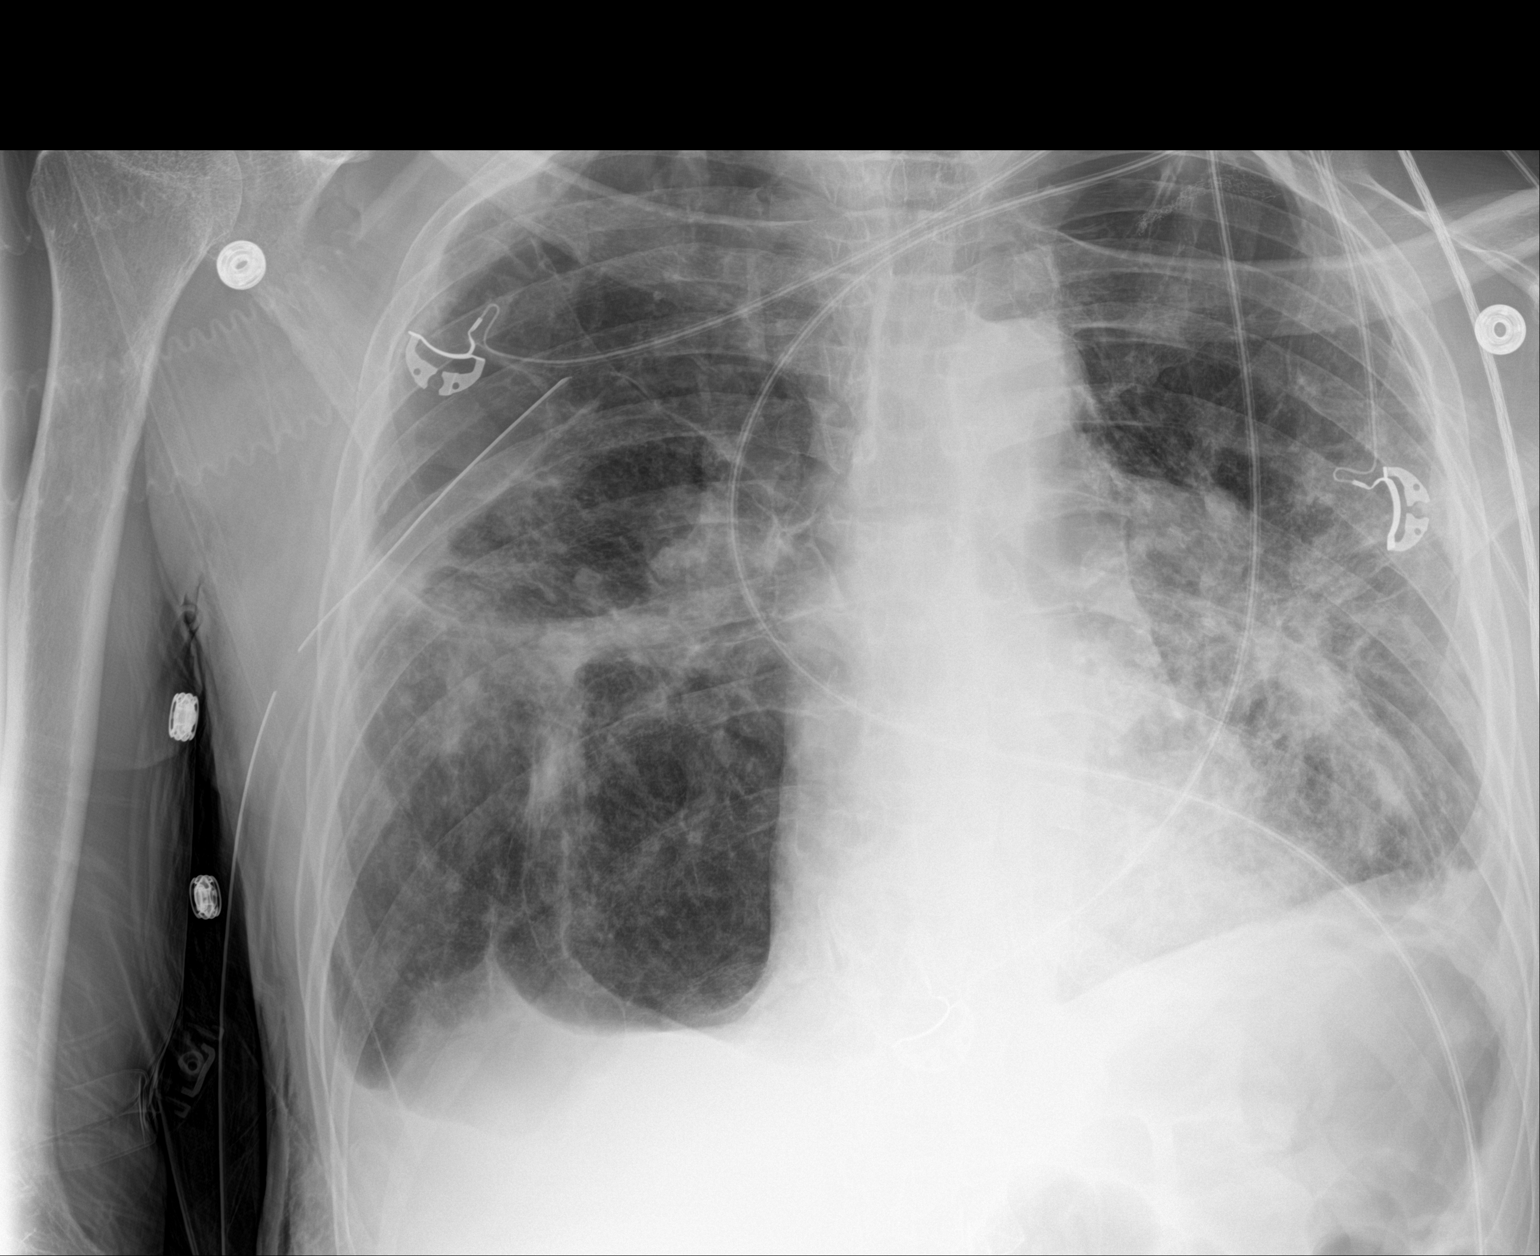

[2 of 2 positions shown; findings below may reference images not displayed]

FINDINGS: Stable cardiomediastinal silhouette. Right-sided chest tube is noted
which is partially withdrawn since prior exam, with side hole seen
outside of thoracic space. No pneumothorax is noted. Postoperative
changes are noted in left lung apex. Stable bilateral lung opacities
are noted, left greater than right. Stable bibasilar scarring is
noted. Endotracheal and nasogastric tubes have been removed.
IMPRESSION: Endotracheal and nasogastric tubes have been removed. Right-sided
chest tube has been partially withdrawn with side hole outside of
thoracic space. No definite pneumothorax is seen. Stable bilateral
lung opacities are noted in bibasilar scarring as described above.

## 2018-07-21 IMAGING — DX DG CHEST 1V
1 series · 1 of 1 positions shown · non-contrast
Comparison: 05/09/2017

CLINICAL DATA: Shortness of breath

EXAM:
CHEST  1 VIEW

[chest ap]
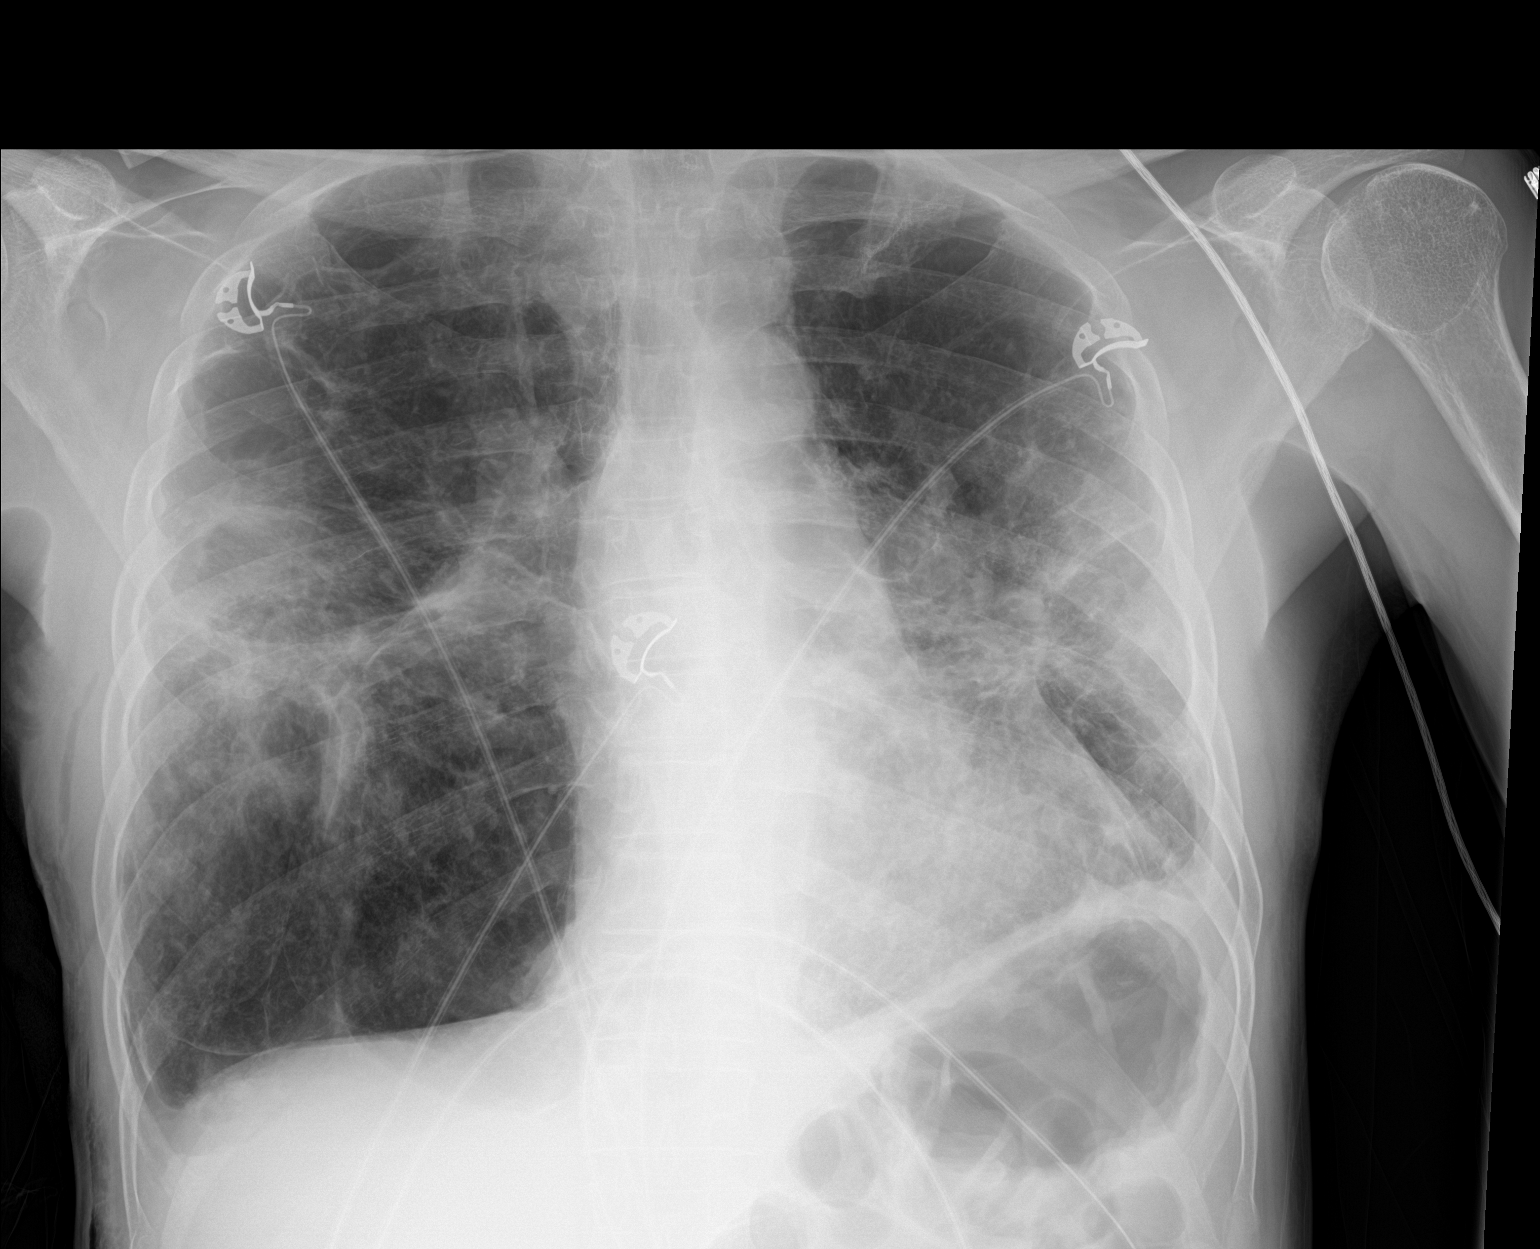

[1 of 1 positions shown; findings below may reference images not displayed]

FINDINGS: Stable chronic parenchymal changes throughout the lungs. No visible
pneumothorax. Small bilateral effusions. Heart is borderline in
size.
IMPRESSION: Stable chronic pulmonary parenchymal changes/airspace disease in the
lungs bilaterally.

Small bilateral pleural effusions.

## 2018-07-22 IMAGING — DX DG CHEST 1V PORT
1 series · 1 of 1 positions shown · non-contrast
Comparison: 05/11/2017

CLINICAL DATA: New chest tube placement

EXAM:
PORTABLE CHEST 1 VIEW

[chest ap]
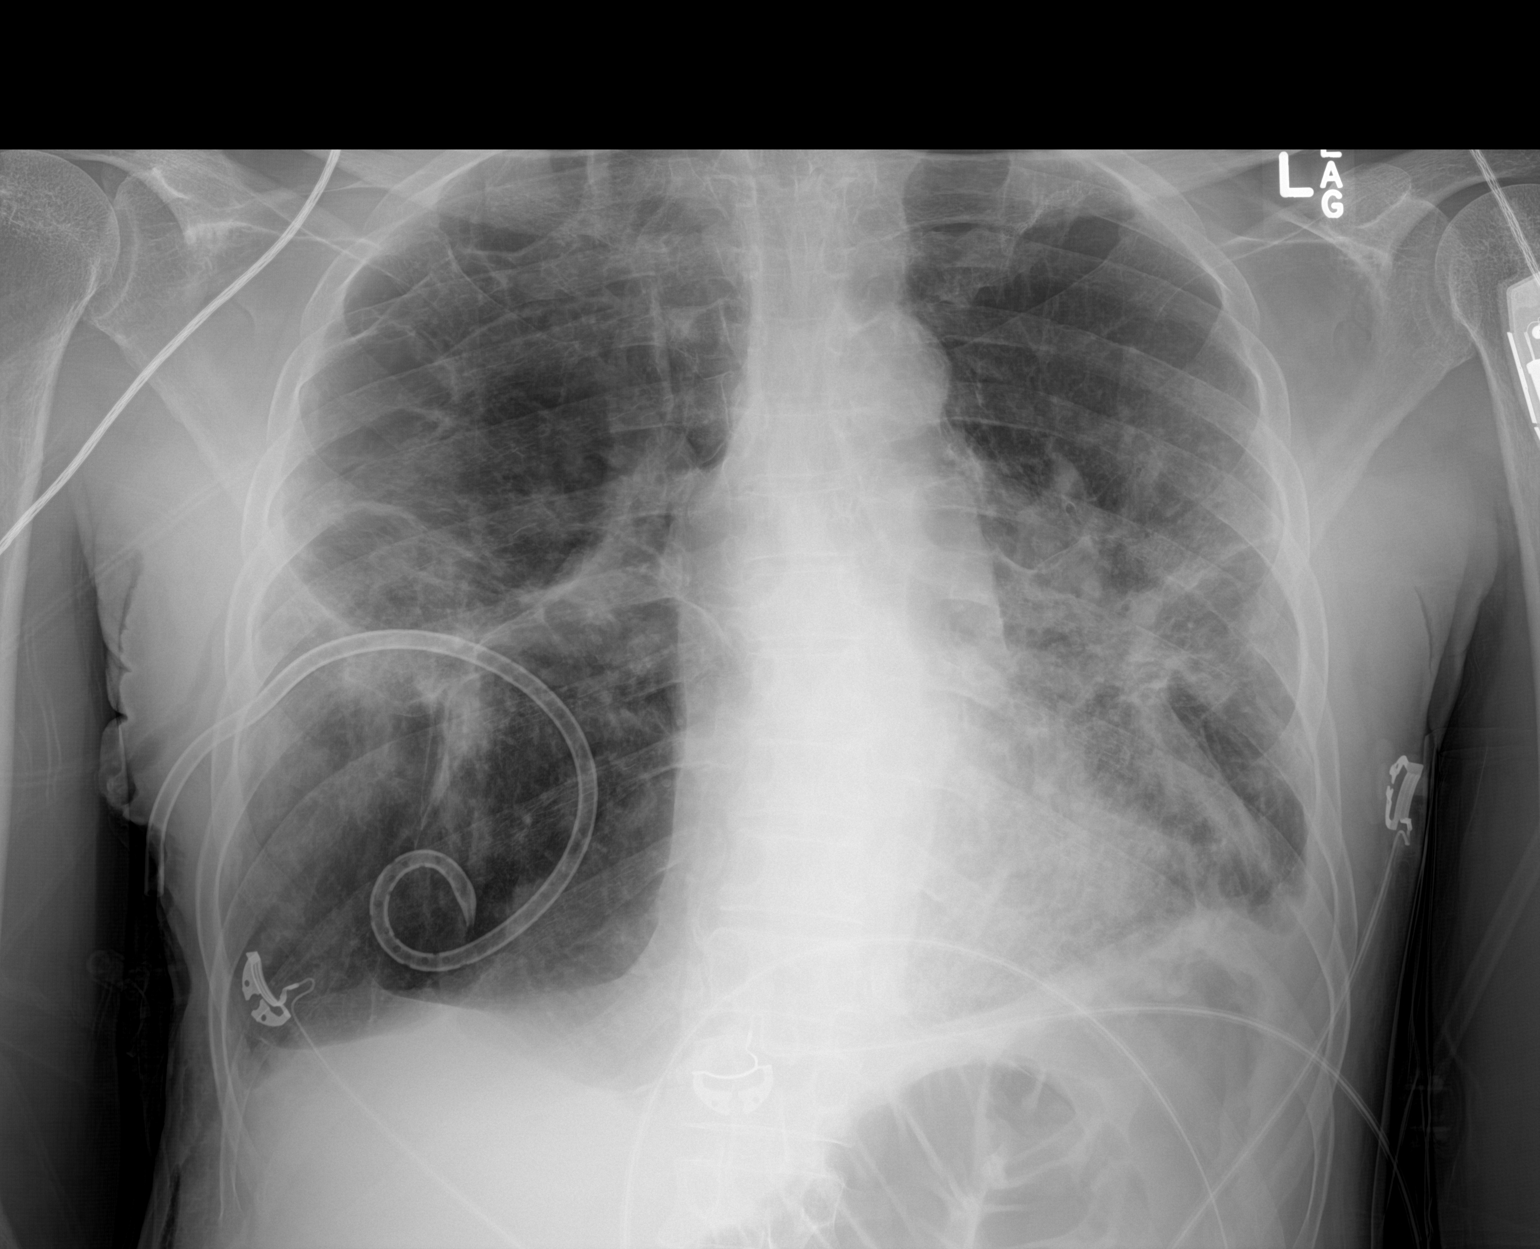

[1 of 1 positions shown; findings below may reference images not displayed]

FINDINGS: A pigtail type right chest tube has been placed. Evacuation of most
of the right pneumothorax. Possible small residual pneumothorax
remaining in the base. Right lung is re-expanded. Persistent linear
scarring or atelectasis in the right mid lung. Scarring and
atelectasis in the left mid and lower lung is unchanged. Pleural
thickening in the left costophrenic angle. Emphysematous changes in
the upper lungs. Heart size is normal. Calcified and tortuous aorta.
IMPRESSION: A right chest tube is been placed with significant evacuation of the
right pneumothorax and re-expansion of the right lung.

## 2018-07-22 IMAGING — DX DG CHEST 1V PORT
1 series · 1 of 1 positions shown · non-contrast
Comparison: 05/10/2017, 05/09/2017, 05/07/2017

CLINICAL DATA: 51-year-old male with a history of respiratory
distress

EXAM:
PORTABLE CHEST 1 VIEW

[chest ap]
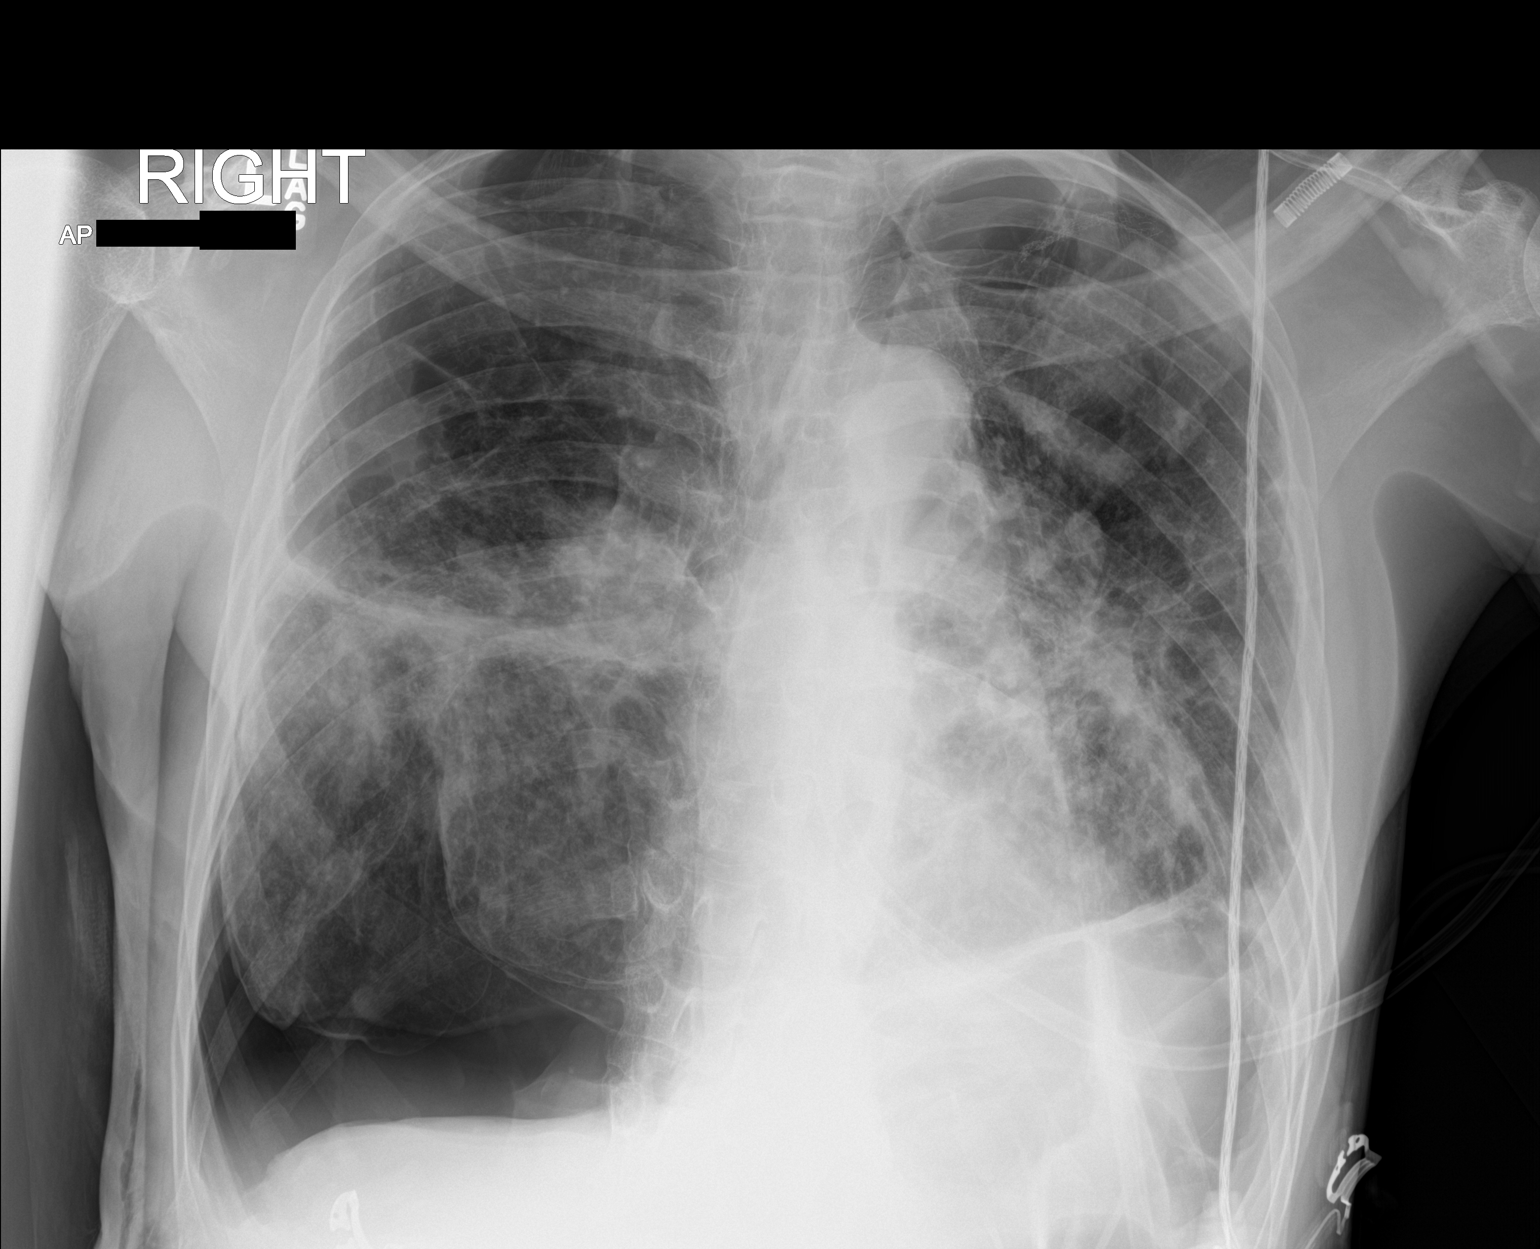

[1 of 1 positions shown; findings below may reference images not displayed]

FINDINGS: Interval recurrence of a right subpulmonic pneumothorax.

Similar appearance of bilateral mixed interstitial and airspace
opacities.

Surgical changes at the apex of the left lung.

Cardiomediastinal silhouette unchanged in size and contour, with the
borders partially obscured by overlying lung/pleural disease.
IMPRESSION: Interval recurrence of right-sided subpulmonic pneumothorax.

Similar appearance of mixed interstitial and airspace opacity of the
bilateral lungs.

Surgical changes at the apex of the left lung.

These results were called by telephone at the time of interpretation
on 05/11/2017 at [DATE] to the nurse caring for the patient, Ms
Maisie Franco, who verbally acknowledged these results.

## 2018-07-24 IMAGING — DX DG CHEST 1V PORT
1 series · 1 of 1 positions shown · non-contrast
Comparison: Radiograph May 11, 2017.

CLINICAL DATA: Acute respiratory failure.

EXAM:
PORTABLE CHEST 1 VIEW

[chest ap]
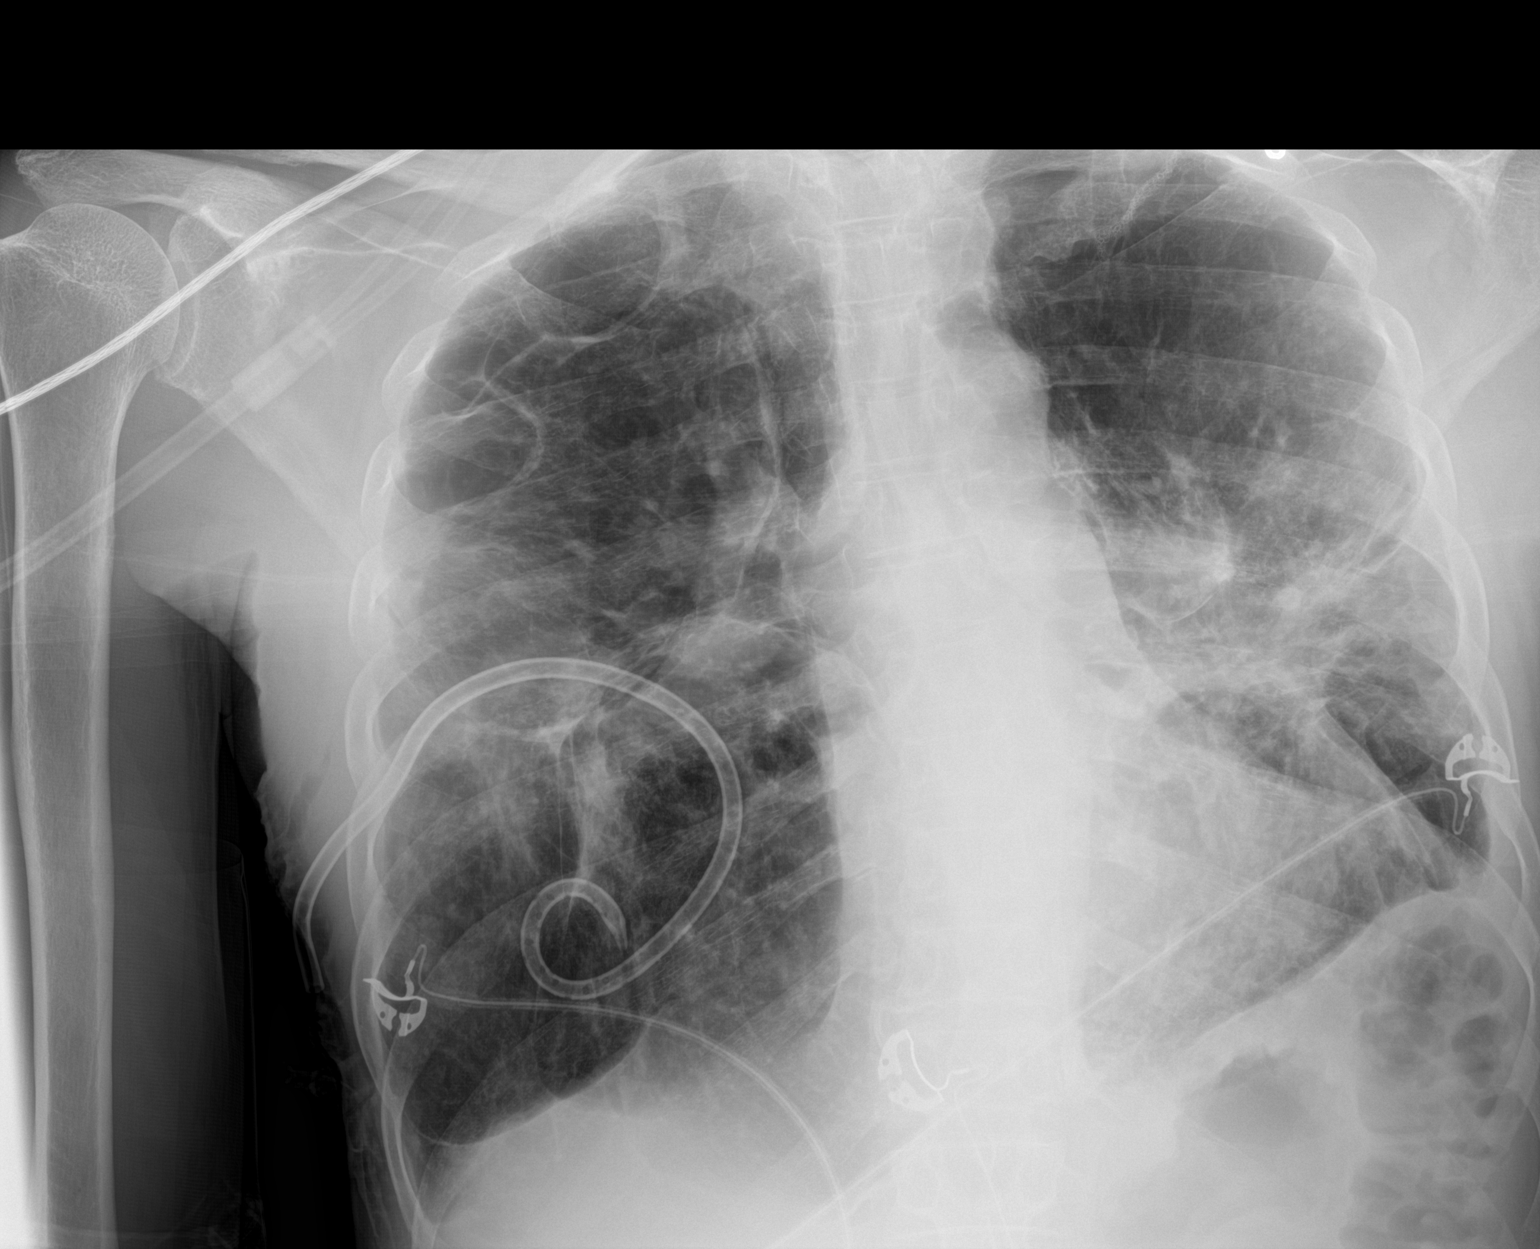

[1 of 1 positions shown; findings below may reference images not displayed]

FINDINGS: The heart size and mediastinal contours are within normal limits.
Right-sided chest tube is unchanged in position. No definite
pneumothorax is noted. Postoperative changes are noted in the left
upper lobe. Stable emphysematous disease is noted in both upper
lobes, with large bulla formation in the right upper lobe. Stable
bilateral midlung opacities are noted most consistent with scarring
or fibrosis. Scarring is noted in both lung bases is well.. The
visualized skeletal structures are unremarkable.
IMPRESSION: Stable position of right-sided chest tube without definite
pneumothorax. Stable bilateral emphysematous disease and scarring is
noted.

## 2018-07-25 IMAGING — DX DG CHEST 1V PORT
1 series · 1 of 1 positions shown · non-contrast
Comparison: Chest x-ray from yesterday.

CLINICAL DATA: Pneumothorax.

EXAM:
PORTABLE CHEST 1 VIEW

[chest ap]
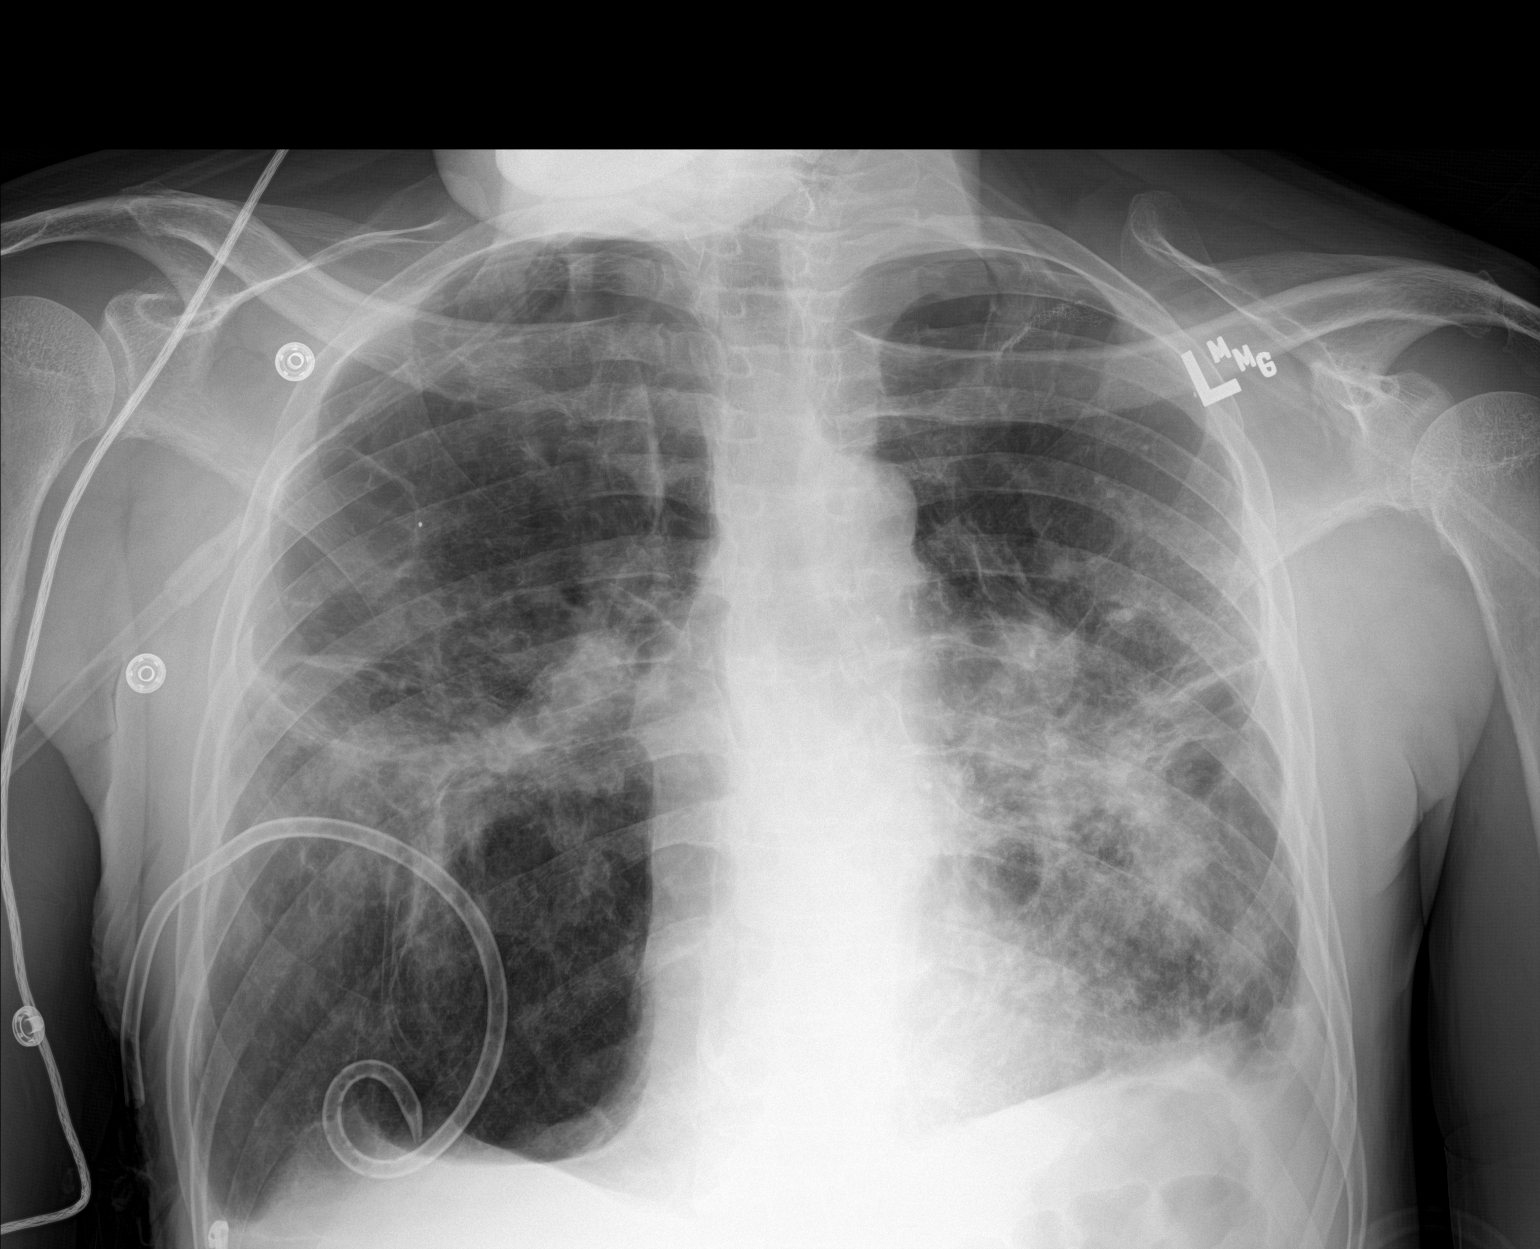

[1 of 1 positions shown; findings below may reference images not displayed]

FINDINGS: Unchanged right pigtail chest tube. Stable cardiomediastinal
silhouette. Normal pulmonary vascularity. Postsurgical changes again
noted in the left upper lobe. Bullous emphysematous changes in the
bilateral upper lobes are similar to prior study. Perihilar and left
basilar fibrosis/scarring, grossly unchanged. Chronic blunting of
the left costophrenic angle again noted. No definite pneumothorax.
No acute osseous abnormality.
IMPRESSION: 1. Stable right pigtail chest tube.  No definite pneumothorax.
2. Severe COPD with stable scarring/fibrosis.

## 2018-07-25 IMAGING — DX DG CHEST 1V PORT
1 series · 1 of 1 positions shown · non-contrast
Comparison: 05/14/17

CLINICAL DATA: Chest tube placement pleurodesis

EXAM:
PORTABLE CHEST 1 VIEW

[chest ap]
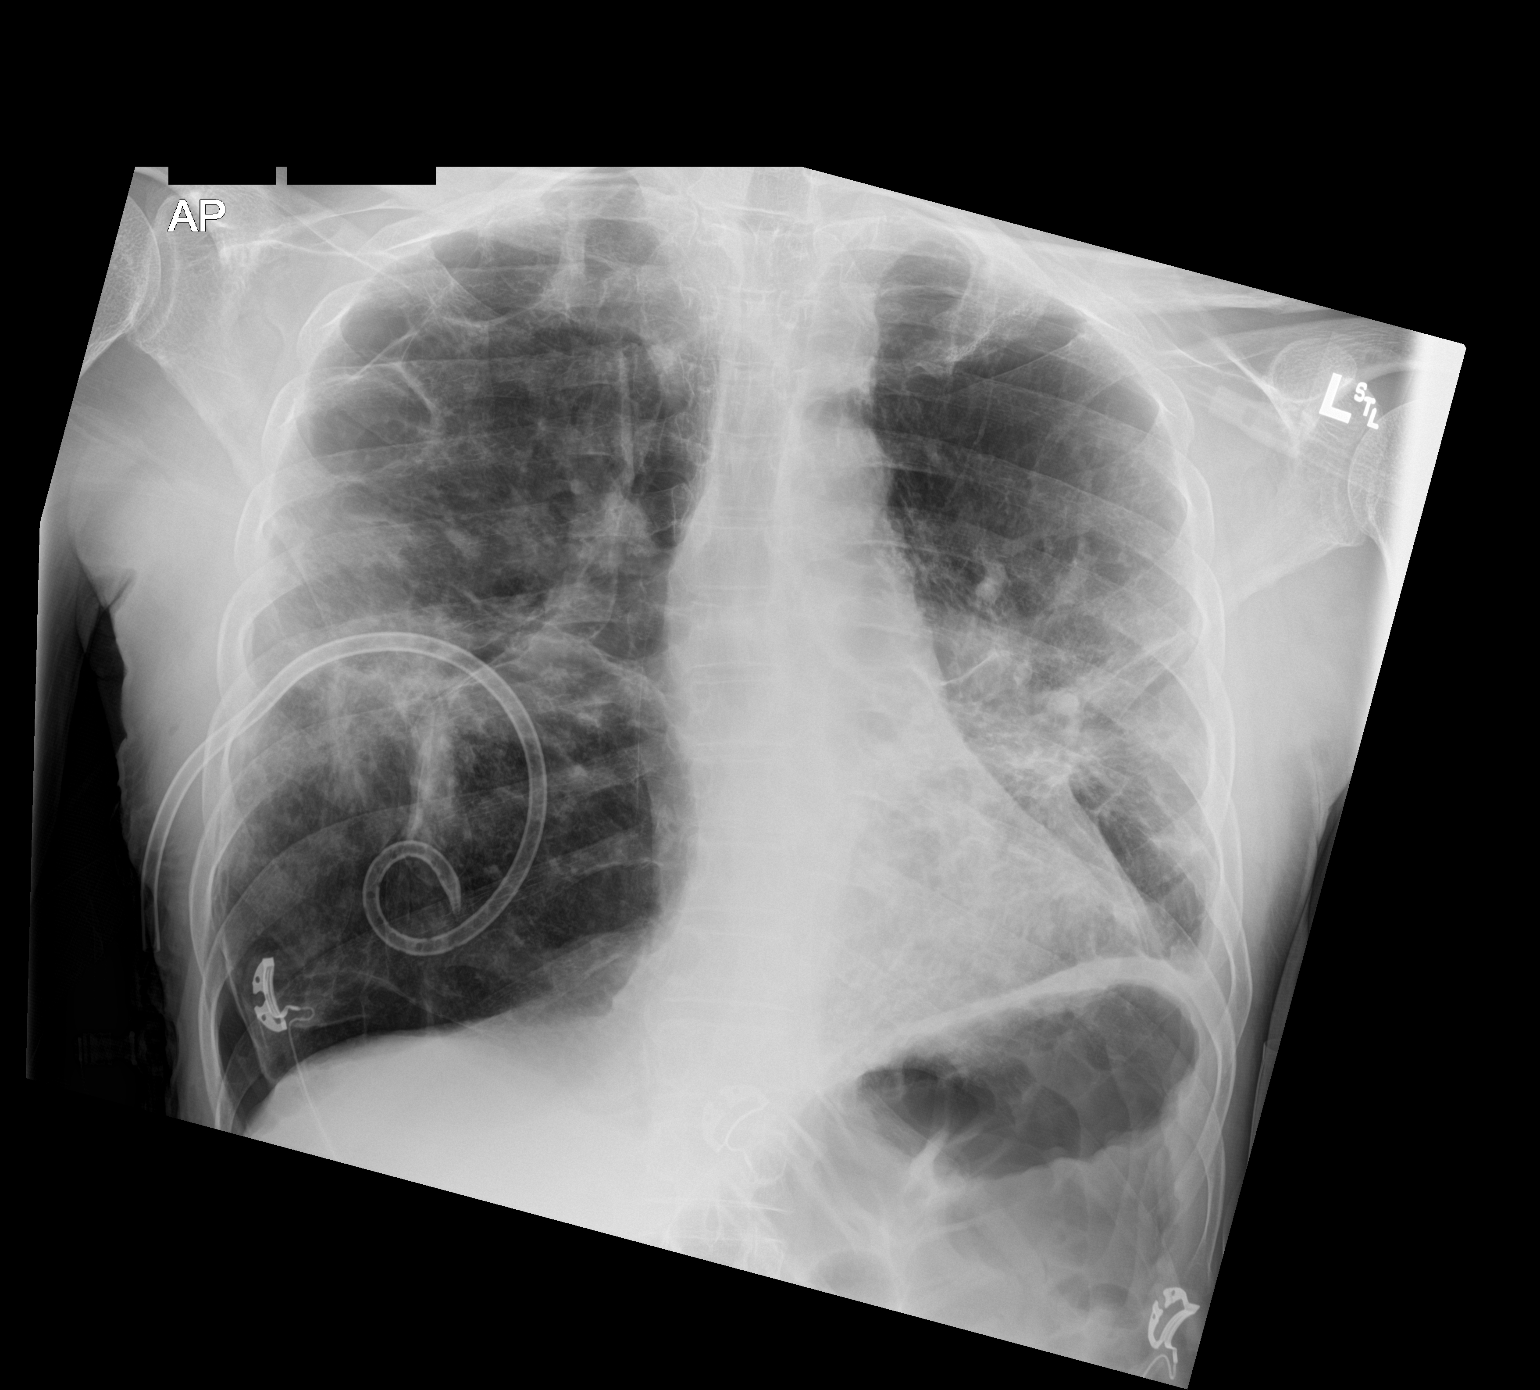

[1 of 1 positions shown; findings below may reference images not displayed]

FINDINGS: Normal cardiac silhouette. RIGHT chest not changed in position with
tip projecting over the RIGHT lower lobe. Small subpleural
pneumothorax now evident along the RIGHT costophrenic angle. Severe
chronic interstitial scarring and bullous change in the upper lobes.
IMPRESSION: 1. Small subpulmonic pneumothorax along the RIGHT cardiophrenic
angle. RIGHT chest tube in similar position.
2. Chronic interstitial lung disease again noted.

## 2018-07-26 IMAGING — DX DG CHEST 1V
1 series · 1 of 1 positions shown · non-contrast
Comparison: Chest x-ray 05/14/2017.

CLINICAL DATA: 51-year-old male with history of shortness of
breath. Follow-up evaluation for pneumothorax.

EXAM:
CHEST  1 VIEW

[chest ap]
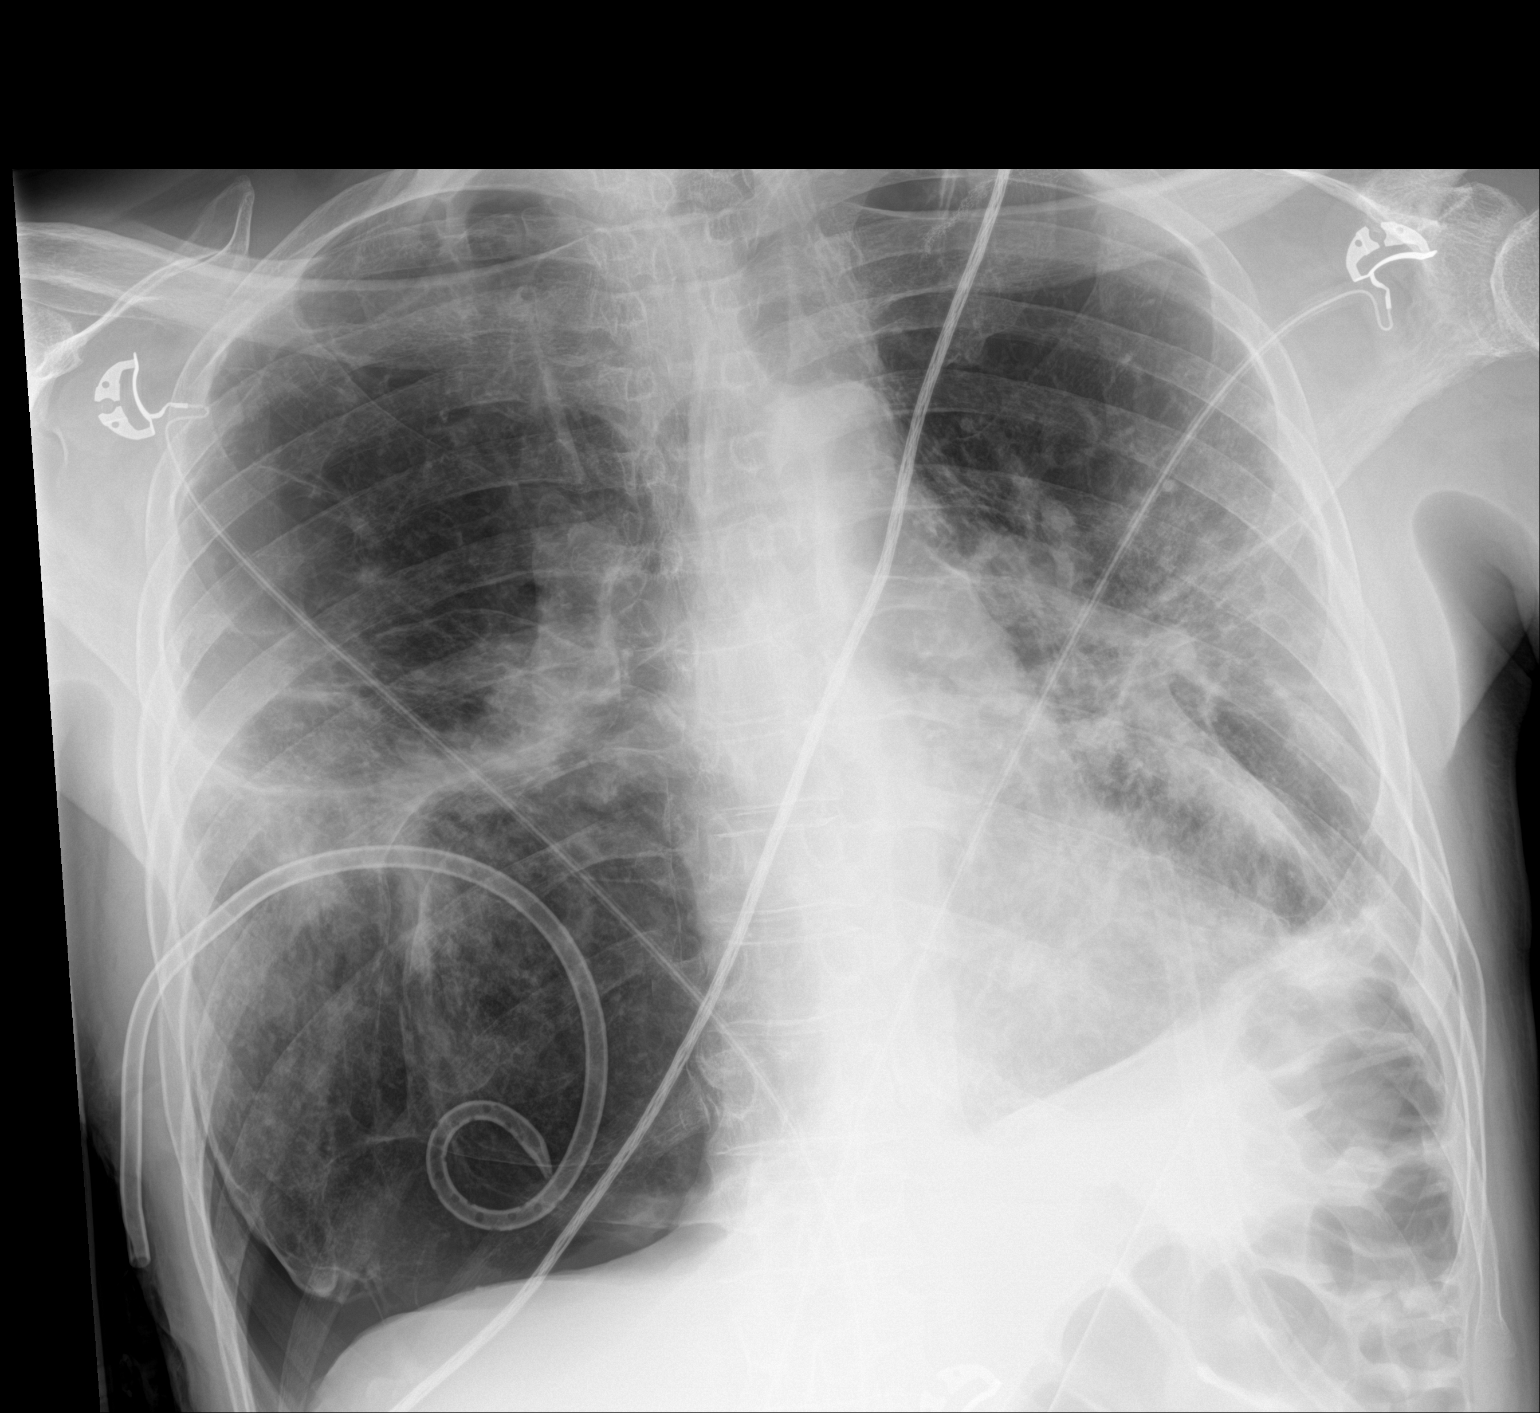

[1 of 1 positions shown; findings below may reference images not displayed]

FINDINGS: Previously noted right-sided small bore chest tube remains in
position with pigtail projecting over the lower right hemithorax.
There continues to be a small to moderate right pneumothorax,
estimated to occupy 10-15% of the volume of the right hemithorax
most evident in the lower right hemithorax, slightly increased
compared to yesterday's examination. Diffuse mass-like areas of
architectural distortion and reticular nodularity is again noted
throughout the lungs bilaterally, similar to numerous prior
examinations, related to underlying sarcoidosis. Emphysematous
changes. Chronic elevation of the left hemidiaphragm. Heart size is
normal. Aortic atherosclerosis.
IMPRESSION: 1. Slight increased size of small to moderate right pneumothorax
with right-sided chest tube stable in position, as above.
2. Chronic changes of sarcoidosis and emphysema redemonstrated.
3. Aortic atherosclerosis.

## 2018-07-28 IMAGING — DX DG CHEST 1V PORT
1 series · 1 of 1 positions shown · non-contrast
Comparison: Portable exam [id number] hours compared to 05/15/2017

CLINICAL DATA: Respiratory failure, pneumothorax

EXAM:
PORTABLE CHEST 1 VIEW

[chest ap]
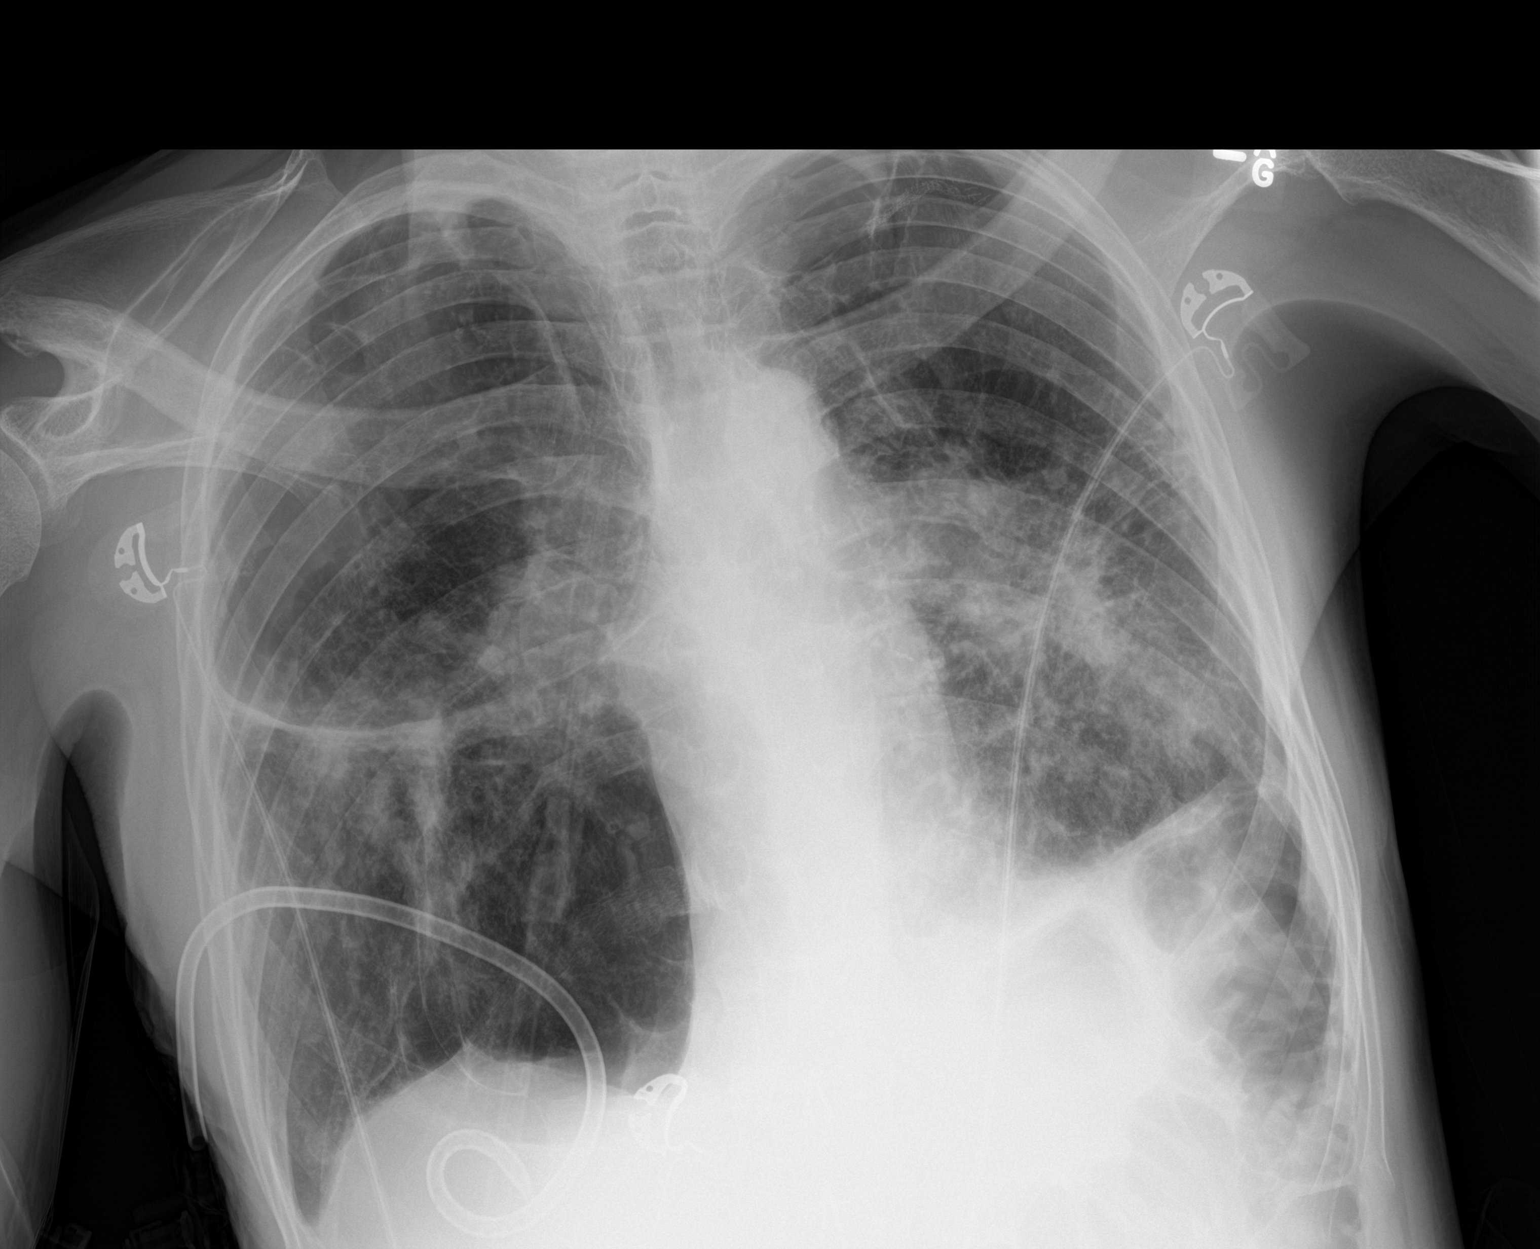

[1 of 1 positions shown; findings below may reference images not displayed]

FINDINGS: Pigtail RIGHT thoracostomy tube again identified at the inferior
RIGHT chest.

Stable heart size and mediastinal contours.

Emphysematous changes with persistent infiltrates in RIGHT mid lung
and in LEFT lower lobe.

Decrease in loculated pneumothorax at the RIGHT base.

Severe emphysematous changes at the RIGHT upper lobe, less at LEFT
upper lobe.

Postsurgical changes LEFT upper lobe.

No pleural effusion identified.
IMPRESSION: RIGHT thoracostomy tube with decreased RIGHT basilar pneumothorax.

Bullous COPD changes with persistent BILATERAL pulmonary
infiltrates.
